# Patient Record
Sex: Male | Born: 1965 | Race: White | Hispanic: No | Marital: Married | Smoking: Current every day smoker
Health system: Southern US, Community
[De-identification: ages and names within clinical notes are randomized; demographics above are authoritative.]

## PROBLEM LIST (undated history)

## (undated) DIAGNOSIS — F17200 Nicotine dependence, unspecified, uncomplicated: Secondary | ICD-10-CM

## (undated) DIAGNOSIS — I10 Essential (primary) hypertension: Secondary | ICD-10-CM

## (undated) DIAGNOSIS — R569 Unspecified convulsions: Secondary | ICD-10-CM

## (undated) DIAGNOSIS — F319 Bipolar disorder, unspecified: Secondary | ICD-10-CM

## (undated) DIAGNOSIS — R0600 Dyspnea, unspecified: Secondary | ICD-10-CM

## (undated) DIAGNOSIS — F329 Major depressive disorder, single episode, unspecified: Secondary | ICD-10-CM

## (undated) DIAGNOSIS — E785 Hyperlipidemia, unspecified: Secondary | ICD-10-CM

## (undated) DIAGNOSIS — M1990 Unspecified osteoarthritis, unspecified site: Secondary | ICD-10-CM

## (undated) DIAGNOSIS — F419 Anxiety disorder, unspecified: Secondary | ICD-10-CM

## (undated) DIAGNOSIS — Z8701 Personal history of pneumonia (recurrent): Secondary | ICD-10-CM

## (undated) DIAGNOSIS — E119 Type 2 diabetes mellitus without complications: Secondary | ICD-10-CM

## (undated) DIAGNOSIS — G4730 Sleep apnea, unspecified: Secondary | ICD-10-CM

## (undated) DIAGNOSIS — J45909 Unspecified asthma, uncomplicated: Secondary | ICD-10-CM

## (undated) DIAGNOSIS — Z87442 Personal history of urinary calculi: Secondary | ICD-10-CM

## (undated) DIAGNOSIS — F32A Depression, unspecified: Secondary | ICD-10-CM

## (undated) DIAGNOSIS — Z9189 Other specified personal risk factors, not elsewhere classified: Secondary | ICD-10-CM

## (undated) HISTORY — DX: Morbid (severe) obesity due to excess calories: E66.01

## (undated) HISTORY — PX: COLONOSCOPY: SHX174

## (undated) HISTORY — DX: Nicotine dependence, unspecified, uncomplicated: F17.200

## (undated) HISTORY — DX: Essential (primary) hypertension: I10

## (undated) HISTORY — DX: Type 2 diabetes mellitus without complications: E11.9

## (undated) HISTORY — DX: Hyperlipidemia, unspecified: E78.5

## (undated) HISTORY — PX: OTHER SURGICAL HISTORY: SHX169

## (undated) HISTORY — DX: Other specified personal risk factors, not elsewhere classified: Z91.89

---

## 2016-05-08 ENCOUNTER — Encounter: Payer: Self-pay | Admitting: "Endocrinology

## 2016-05-08 ENCOUNTER — Ambulatory Visit (INDEPENDENT_AMBULATORY_CARE_PROVIDER_SITE_OTHER): Payer: Medicare HMO | Admitting: "Endocrinology

## 2016-05-08 VITALS — BP 130/88 | HR 77 | Ht 72.0 in | Wt 309.0 lb

## 2016-05-08 DIAGNOSIS — E291 Testicular hypofunction: Secondary | ICD-10-CM

## 2016-05-08 NOTE — Progress Notes (Signed)
HPI: Frank [name] is a 51 y.o.-year-old man, referred by his PCP, Dr. Merleen MillinerWinfield, for evaluation and management of low testosterone.  He admits for decreased libido.Has difficulty obtaining or maintaining an erection.No trauma to testes, Chemotherapy,  testicular irradiation nor surgery. No h/o of mumps orchitis/h/o autoimmune disorders. No h/o cryptorchidism. He grew and went through puberty like his peers.No shrinking of testes.  No incomplete/delayed sexual development.    No breast discomfort/gynecomastia.   No loss of body hair (axillary/pubic)/decreased need for shaving. No abnormal sense of smell (only allergies). No hot flushes. No vision problems. No report of changing headaches. No FH of hypogonadism/infertility . No personal h/o infertility - has children. No FH of hemochromatosis or pituitary tumors. Has had  excessive weight all of his adult life.  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 Has hx of  chronic pain, requiring opiates, does not take steroids.  - He denies excessive alcohol use , has had exposure to steroids due to his COPD.  - Denies history of anabolic steroids/androgens.  - He was started on testosterone gel for 1 year with unsatisfactory results and was switched to testosterone injection a year ago, he does not know the dose.  No herbal medicines. He is on  polypharmacy including multiple antidepressants and anti-epileptics.     Outpatient Encounter Prescriptions as of 05/08/2016  Medication Sig  . atorvastatin (LIPITOR) 40 MG tablet Take 40 mg by mouth daily.  . divalproex (DEPAKOTE) 500 MG DR tablet Take 500 mg by mouth 3 (three) times daily.  Marland Kitchen. esomeprazole (NEXIUM) 40 MG capsule Take 40 mg by mouth daily at 12 noon.  . Fluticasone-Salmeterol (ADVAIR) 500-50 MCG/DOSE AEPB Inhale 1 puff into the lungs 2 (two) times daily.  Marland Kitchen. gabapentin (NEURONTIN) 300 MG capsule Take 300 mg by mouth 3 (three) times daily.  Marland Kitchen. glimepiride (AMARYL) 2 MG tablet Take 2 mg by mouth daily with  breakfast.  . hydrochlorothiazide (HYDRODIURIL) 25 MG tablet Take 25 mg by mouth daily.  Marland Kitchen. lisinopril (PRINIVIL,ZESTRIL) 20 MG tablet Take 20 mg by mouth daily.  . metFORMIN (GLUCOPHAGE) 1000 MG tablet Take 1,000 mg by mouth 2 (two) times daily with a meal.  . montelukast (SINGULAIR) 10 MG tablet Take 10 mg by mouth at bedtime.  Marland Kitchen. PARoxetine (PAXIL) 40 MG tablet Take 40 mg by mouth every morning.  . verapamil (CALAN) 120 MG tablet Take 120 mg by mouth once.   No facility-administered encounter medications on file as of 05/08/2016.     ROS: Constitutional: obese most of his adult life, + fatigue, no subjective hyperthermia, no subjective hypothermia Eyes: no blurry vision, no xerophthalmia ENT: no sore throat, no nodules palpated in throat, no dysphagia/odynophagia, no hoarseness Cardiovascular: no Chest Pain, + Shortness of Breath, no palpitations, no leg swelling Respiratory: + cough, no SOB Gastrointestinal: no Nausea/Vomiting/Diarhhea Musculoskeletal: no muscle/joint aches Skin: no rashes Neurological: no tremors, no numbness, no tingling, no dizziness Psychiatric: no depression, no anxiety   PE: BP 130/88   Pulse 77   Ht 6' (1.829 m)   Wt (!) 309 lb (140.2 kg)   BMI 41.91 kg/m  Wt Readings from Last 3 Encounters:  05/08/16 (!) 309 lb (140.2 kg)   Constitutional: obese, in NAD Eyes: PERRLA, EOMI, no exophthalmos ENT: moist mucous membranes, no thyromegaly, no cervical lymphadenopathy Cardiovascular: Distant heart sounds, No murmur/gallop/rubs Respiratory: + Scattered wheezes and poor air entry Gastrointestinal: obese, abdomen soft, NT, ND, BS+ Musculoskeletal: no deformities, strength intact in all 4 Skin: moist, warm, no  rashes Neurological: no tremor with outstretched hands, DTR normal in all 4 Genital exam: normal male escutcheon, no inguinal LAD, normal phallus, testes ~20-61mL, no testicular masses, no penile discharge.  - Has mild gynecomastia.  Review of his  labs from the referral package shows that his testosterone was as follows 11/23/2014 143, 12/02/2015 133, and 01/09/2016  99 This is despite testosterone injection for the last year-dose unclear. - His labs show normal hematocrit, normal transaminases, normal PSA. - He has well controlled type 2 diabetes with A1c of 6%.  ASSESSMENT: 1. Hypogonadism 2. Erectile Dysfunction   PLAN:  1. Low total testosterone I had a long discussion with the pt regarding his testosterone results, which we reviewed together.   - He has multiple risk factors for hypogonadism including obesity, exposure to opioids, chronic heavy smoking.  - We discussed the importance of improving his sleep, diet (  to cut down fat and meat and increase the use of fruit and vegetables), and exercise to improve his endogenous testosterone secretion.  - In his case, he may continue to need testosterone supplement balancing the dose again asked cardiac risk factors from testosterone supplementation- since he has also multiple risk factors for coronary artery disease including type 2 diabetes, smoking, hypertension, obesity, sedentary life, hyperlipidemia . - We will start with low-dose and advance as appropriate.  - I have discussed use of testosterone,  diagnosis of hypogonadism,  and relevant workup of hypogonadism with him. - I will proceed to obtain early morning blood sample for LH/FSH, prolactin, total testosterone, free testosterone, and SH BG - If there is a significant pathology indicating secondary hypogonadism he will be considered for MRI of the sella/pituitary.  2. Erectile dysfunction - erection is a function of vascular health of the corpora cavernosa, discussed with patient. - May not improve even with testosterone supplement. - The above-mentioned lifestyle measures would improve his sexual health including erectile dysfunction.   Marquis Lunch, MD Phone: [phone]  Fax: [phone]   05/08/2016, 3:22 PM

## 2016-05-10 LAB — TESTOSTERONE, FREE, TOTAL, SHBG
SEX HORMONE BINDING: 19.7 nmol/L (ref 19.3–76.4)
TESTOSTERONE FREE: 2.4 pg/mL — AB (ref 7.2–24.0)
TESTOSTERONE: 85 ng/dL — AB (ref 264–916)

## 2016-05-10 LAB — FSH/LH
FSH: 1.4 m[IU]/mL — ABNORMAL LOW (ref 1.5–12.4)
LH: 0.4 m[IU]/mL — AB (ref 1.7–8.6)

## 2016-05-10 LAB — PROLACTIN: Prolactin: 10.9 ng/mL (ref 4.0–15.2)

## 2016-05-16 ENCOUNTER — Encounter: Payer: Self-pay | Admitting: "Endocrinology

## 2016-05-16 ENCOUNTER — Ambulatory Visit (INDEPENDENT_AMBULATORY_CARE_PROVIDER_SITE_OTHER): Payer: Medicare HMO | Admitting: "Endocrinology

## 2016-05-16 VITALS — BP 139/81 | HR 79 | Ht 72.0 in | Wt 315.0 lb

## 2016-05-16 DIAGNOSIS — E291 Testicular hypofunction: Secondary | ICD-10-CM

## 2016-05-16 MED ORDER — TESTOSTERONE CYPIONATE 200 MG/ML IM SOLN: 100.0000 mg | INTRAMUSCULAR | 0 refills | Status: DC

## 2016-05-16 MED ORDER — "SYRINGE 21G X 1"" 3 ML MISC": 1 refills | Status: DC

## 2016-05-16 NOTE — Progress Notes (Signed)
HPI: Frank [name] is a 51 y.o.-year-old man, referred by his PCP, Dr. Merleen Milliner, for evaluation and management of low testosterone.  He admits for decreased libido. Has difficulty obtaining or maintaining an erection. No trauma to testes, Chemotherapy,  testicular irradiation nor surgery. No h/o of mumps orchitis/h/o autoimmune disorders. No h/o cryptorchidism. He grew and went through puberty like his peers.No shrinking of testes.  No incomplete/delayed sexual development.    No breast discomfort/gynecomastia.   No loss of body hair (axillary/pubic)/decreased need for shaving. No abnormal sense of smell (only allergies). No hot flushes. No vision problems. No report of changing headaches. No FH of hypogonadism/infertility . No personal h/o infertility - has children. No FH of hemochromatosis or pituitary tumors. Has had  excessive weight all of his adult life.  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 Has hx of  chronic pain, requiring opiates, does not take steroids.  - He denies excessive alcohol use , has had exposure to steroids due to his COPD.  - Denies history of anabolic steroids/androgens.  - He was started on testosterone gel for 1 year with unsatisfactory results and was switched to testosterone injection a year ago, he does not know the dose.  No herbal medicines. He is on  polypharmacy including multiple antidepressants and anti-epileptics.     Outpatient Encounter Prescriptions as of 05/16/2016  Medication Sig  . aspirin EC 81 MG tablet Take 81 mg by mouth daily.  Marland Kitchen atorvastatin (LIPITOR) 40 MG tablet Take 40 mg by mouth daily.  . celecoxib (CELEBREX) 200 MG capsule Take 200 mg by mouth 2 (two) times daily.  . divalproex (DEPAKOTE) 500 MG DR tablet Take 500 mg by mouth 3 (three) times daily.  Marland Kitchen esomeprazole (NEXIUM) 40 MG capsule Take 40 mg by mouth daily at 12 noon.  . Fluticasone-Salmeterol (ADVAIR) 500-50 MCG/DOSE AEPB Inhale 1 puff into the lungs 2 (two) times daily.  Marland Kitchen gabapentin  (NEURONTIN) 300 MG capsule Take 300 mg by mouth 3 (three) times daily.  Marland Kitchen glimepiride (AMARYL) 2 MG tablet Take 2 mg by mouth daily with breakfast.  . hydrochlorothiazide (HYDRODIURIL) 25 MG tablet Take 25 mg by mouth daily.  Marland Kitchen lisinopril (PRINIVIL,ZESTRIL) 20 MG tablet Take 20 mg by mouth daily.  . metFORMIN (GLUCOPHAGE) 1000 MG tablet Take 1,000 mg by mouth 2 (two) times daily with a meal.  . montelukast (SINGULAIR) 10 MG tablet Take 10 mg by mouth at bedtime.  Marland Kitchen PARoxetine (PAXIL) 40 MG tablet Take 40 mg by mouth every morning.  . Syringe/Needle, Disp, (SYRINGE 3CC/21GX1") 21G X 1" 3 ML MISC Used to inject testosterone every 14 days  . testosterone cypionate (DEPOTESTOSTERONE CYPIONATE) 200 MG/ML injection Inject 0.5 mLs (100 mg total) into the muscle every 14 (fourteen) days.  . verapamil (CALAN) 120 MG tablet Take 120 mg by mouth once.   No facility-administered encounter medications on file as of 05/16/2016.     ROS: Constitutional: obese most of his adult life, + fatigue, no subjective hyperthermia, no subjective hypothermia Eyes: no blurry vision, no xerophthalmia ENT: no sore throat, no nodules palpated in throat, no dysphagia/odynophagia, no hoarseness Cardiovascular: no Chest Pain, + Shortness of Breath, no palpitations, no leg swelling Respiratory: + cough, no SOB Gastrointestinal: no Nausea/Vomiting/Diarhhea Musculoskeletal: no muscle/joint aches Skin: no rashes Neurological: no tremors, no numbness, no tingling, no dizziness Psychiatric: no depression, no anxiety   PE: BP 139/81   Pulse 79   Ht 6' (1.829 m)   Wt (!) 315 lb (142.9 kg)  BMI 42.72 kg/m  Wt Readings from Last 3 Encounters:  05/16/16 (!) 315 lb (142.9 kg)  05/08/16 (!) 309 lb (140.2 kg)   Constitutional: obese, in NAD Eyes: PERRLA, EOMI, no exophthalmos ENT: moist mucous membranes, no thyromegaly, no cervical lymphadenopathy Cardiovascular: Distant heart sounds, No murmur/gallop/rubs Respiratory: +  Scattered wheezes and poor air entry Gastrointestinal: obese, abdomen soft, NT, ND, BS+ Musculoskeletal: no deformities, strength intact in all 4 Skin: moist, warm, no rashes Neurological: no tremor with outstretched hands, DTR normal in all 4 Genital exam: normal male escutcheon, no inguinal LAD, normal phallus, testes ~20-5125mL, no testicular masses, no penile discharge.  - Has mild gynecomastia.  Review of his labs from the referral package shows that his testosterone was as follows 11/23/2014 143, 12/02/2015 133, and 01/09/2016  99 This is despite testosterone injection for the last year-dose unclear. - His labs show normal hematocrit, normal transaminases, normal PSA. - He has well controlled type 2 diabetes with A1c of 6%.   Recent Results (from the past 2160 hour(s))  Testosterone, Free, Total, SHBG     Status: Abnormal   Collection Time: 05/09/16  8:50 AM  Result Value Ref Range   Testosterone 85 (L) 264 - 916 ng/dL    Comment: Adult male reference interval is based on a population of healthy nonobese males (BMI <30) between 6319 and 52 years old. Travison, et.al. JCEM [id number],102;1161-1173. PMID: [id number].    Testosterone, Free 2.4 (L) 7.2 - 24.0 pg/mL   Sex Hormone Binding 19.7 19.3 - 76.4 nmol/L  FSH/LH     Status: Abnormal   Collection Time: 05/09/16  8:50 AM  Result Value Ref Range   LH 0.4 (L) 1.7 - 8.6 mIU/mL   FSH 1.4 (L) 1.5 - 12.4 mIU/mL  Prolactin     Status: None   Collection Time: 05/09/16  8:50 AM  Result Value Ref Range   Prolactin 10.9 4.0 - 15.2 ng/mL    ASSESSMENT: 1. Hypogonadotropic Hypogonadism 2. Erectile Dysfunction   PLAN:  1. Low total testosterone I had a long discussion with the pt regarding his testosterone results, which we reviewed together. 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 Prolactin is normal, we will defer pituitary/sella imaging for now. - He has multiple risk factors for hypogonadism including  obesity, exposure to opioids, chronic heavy smoking.  - We discussed the importance of improving his sleep, diet (  to cut down fat and meat and increase the use of fruit and vegetables), and exercise to improve his endogenous testosterone secretion.  - In his case, total testosterone is only 85 and free testosterone is also low at 2.4. He will continue to need testosterone supplement balancing the dose against cardiac risk factors from testosterone supplementation- since he has also multiple risk factors for coronary artery disease including type 2 diabetes, smoking, hypertension, obesity, sedentary life, hyperlipidemia . - We will start with low-dose and advance as appropriate. I prescribed testosterone 100 mg IM every 14 days. He will continue to get his injections at his PMD office. He will return in 3 months with repeat testosterone, CBC, PSA.  2. Erectile dysfunction - erection is a function of vascular health of the corpora cavernosa, discussed with patient. - May not improve even with testosterone supplement.  - The above-mentioned lifestyle measures would improve his sexual health including erectile dysfunction.  Frank LunchGebre Kierria Feigenbaum, MD Phone: [phone]  Fax: [phone]   05/16/2016, 9:40 AM

## 2016-08-16 ENCOUNTER — Ambulatory Visit: Payer: Medicare HMO | Admitting: "Endocrinology

## 2016-09-17 ENCOUNTER — Other Ambulatory Visit: Payer: Self-pay | Admitting: "Endocrinology

## 2016-11-28 ENCOUNTER — Other Ambulatory Visit: Payer: Self-pay

## 2016-11-28 MED ORDER — "SYRINGE/NEEDLE (DISP) 25G X 1"" 1 ML MISC": 0 refills | Status: DC

## 2017-10-15 ENCOUNTER — Other Ambulatory Visit (INDEPENDENT_AMBULATORY_CARE_PROVIDER_SITE_OTHER): Payer: Self-pay | Admitting: Radiology

## 2017-10-15 ENCOUNTER — Other Ambulatory Visit: Payer: Self-pay | Admitting: Radiology

## 2017-10-15 ENCOUNTER — Ambulatory Visit (INDEPENDENT_AMBULATORY_CARE_PROVIDER_SITE_OTHER): Payer: Medicare HMO | Admitting: Orthopedic Surgery

## 2017-10-15 ENCOUNTER — Encounter (INDEPENDENT_AMBULATORY_CARE_PROVIDER_SITE_OTHER): Payer: Self-pay | Admitting: Orthopedic Surgery

## 2017-10-15 VITALS — BP 115/66 | Ht 69.0 in | Wt 302.0 lb

## 2017-10-15 DIAGNOSIS — M1612 Unilateral primary osteoarthritis, left hip: Secondary | ICD-10-CM | POA: Diagnosis not present

## 2017-10-15 DIAGNOSIS — M1712 Unilateral primary osteoarthritis, left knee: Secondary | ICD-10-CM

## 2017-10-15 MED ORDER — METHYLPREDNISOLONE ACETATE 40 MG/ML IJ SUSP
80.0000 mg | INTRAMUSCULAR | Status: AC | PRN
  Administered 2017-10-15: 80 mg

## 2017-10-15 MED ORDER — BUPIVACAINE HCL 0.5 % IJ SOLN
2.0000 mL | INTRAMUSCULAR | Status: AC | PRN
  Administered 2017-10-15: 2 mL via INTRA_ARTICULAR

## 2017-10-15 MED ORDER — LIDOCAINE HCL 1 % IJ SOLN
2.0000 mL | INTRAMUSCULAR | Status: AC | PRN
  Administered 2017-10-15: 2 mL

## 2017-10-15 NOTE — Progress Notes (Signed)
Office Visit Note   Patient: Frank [name]           Date of Birth: 03/11/1966           MRN: [id number] Visit Date: 10/15/2017              Requested by: Arlina RobesWinfield, Albert Carl, MD [address] [place], [place] [id number] PCP: Arlina RobesWinfield, Albert Carl, MD   Assessment & Plan: Visit Diagnoses:  1. Unilateral primary osteoarthritis, left knee   2. Unilateral primary osteoarthritis, left hip      Plan: #1: Corticosteroid injection to the left knee. #2: Discussed weight loss, water aerobics, dietitian for consultation #3: Follow back up in 1 month for recheck evaluation. #4: He will check his sugars several times today and call his medical doctor if elevated.    Follow-Up Instructions: Return in about 1 month (around 11/12/2017).   Face-to-face time spent with patient was greater than 50 minutes.  Greater than 50% of the time was spent in counseling and coordination of care.  Orders:  Orders Placed This Encounter  Procedures  . Large Joint Inj: L knee   No orders of the defined types were placed in this encounter.     Procedures: Large Joint Inj: L knee on 10/15/2017 11:17 AM Indications: pain and diagnostic evaluation Details: 25 G 1.5 in needle, anteromedial approach  Arthrogram: No  Medications: 2 mL bupivacaine 0.5 %; 2 mL lidocaine 1 %; 80 mg methylPREDNISolone acetate 40 MG/ML Procedure, treatment alternatives, risks and benefits explained, specific risks discussed. Consent was given by the patient. Patient was prepped and draped in the usual sterile fashion.       Clinical Data: No additional findings.   Subjective: Chief Complaint  Patient presents with  . New Patient (Initial Visit)    L HIP AND L KNEE PAIN WENT TO ERIN FEB 2019 AND APRIL 2019 HARD TO BEAR WEIGHT AND IT GIVES AWAY AT TIMES, HAS POPPING A FEELS LIKE SITTING IN TENNIS BALL. HAD INJECTION BUT DID NOT HELP    HPI  Frank [name] is a very pleasant male seen for evaluation of his left hip and left knee.   [facility] emergency room in February and April.  Had problems with his hip and x-rays have been performed revealing bone-on-bone arthritis with cystic changes in the femoral head.  He also has his referred with osteoarthritis of the left knee.  In regards to his hip and knee he has had IM.  He has had difficulty with sleeping and significant difficulty with weightbearing.  He has been seen by other orthopedist who have refused to do his surgery of a total hip replacement because of his BMI greater than 40.  He continues to have difficulty.  He does use ibuprofen 800 mg twice daily as well as IcyHot ointment.  Comes in today for evaluation.   Review of Systems  Constitutional: Positive for fatigue. Negative for fever.  HENT: Negative for ear pain.   Eyes: Negative for pain.  Respiratory: Positive for shortness of breath. Negative for cough.   Cardiovascular: Positive for leg swelling.  Gastrointestinal: Negative for constipation and diarrhea.  Genitourinary: Negative for difficulty urinating.  Musculoskeletal: Positive for back pain. Negative for neck pain.  Skin: Negative for rash.  Allergic/Immunologic: Positive for food allergies.  Neurological: Positive for weakness and numbness.  Hematological: Bruises/bleeds easily.  Psychiatric/Behavioral: Positive for sleep disturbance.     Objective: Vital Signs: BP 115/66 (BP Location: Left Arm, Patient Position: Sitting, Cuff Size: Normal)  Ht 5\' 9"  (1.753 m)   Wt (!) 302 lb (137 kg)   BMI 44.60 kg/m   Physical Exam  Constitutional: He is oriented to person, place, and time. He appears well-developed and well-nourished.  HENT:  Mouth/Throat: Oropharynx is clear and moist.  Eyes: Pupils are equal, round, and reactive to light. EOM are normal.  Pulmonary/Chest: Effort normal.  Neurological: He is alert and oriented to person, place, and time.  Skin: Skin is warm and dry.  Psychiatric: He has a normal mood and affect. His behavior is normal.      Ortho Exam  Exam today reveals very little internal and external rotation of the.  He can sit to about 70 to 80 degrees of flexion of the hip.  He resists me from further flexion.  The left knee reveals range of motion lacks a few degrees of full extension and is able to flex to about 95 degrees.  Does have crepitance with range of motion of the knee.  Trace effusion.  Tender more lateral than medial.  This is more posterior and lateral.    Specialty Comments:  No specialty comments available.  Imaging: No results found.  X-rays reviewed from the PACS system does show bone-on-bone.  Cystic formations in the femoral head.  Questionable avascular necrosis.  X-ray of the left knee reveals OA of the medial compartment with subchondral cysts. Periarticular spur medial distal femur and  Proximal lateral tibial plateau. Sclerosing medial compartments. Intercondylar spurs.  PF OA changes.   PMFS History: Current Outpatient Medications  Medication Sig Dispense Refill  . aspirin EC 81 MG tablet Take 81 mg by mouth daily.    Marland Kitchen atorvastatin (LIPITOR) 40 MG tablet Take 40 mg by mouth daily.    . celecoxib (CELEBREX) 200 MG capsule Take 200 mg by mouth 2 (two) times daily.    . divalproex (DEPAKOTE) 500 MG DR tablet Take 500 mg by mouth 3 (three) times daily.    Marland Kitchen esomeprazole (NEXIUM) 40 MG capsule Take 40 mg by mouth daily at 12 noon.    . Fluticasone-Salmeterol (ADVAIR) 500-50 MCG/DOSE AEPB Inhale 1 puff into the lungs 2 (two) times daily.    Marland Kitchen gabapentin (NEURONTIN) 300 MG capsule Take 300 mg by mouth 3 (three) times daily.    Marland Kitchen glimepiride (AMARYL) 2 MG tablet Take 2 mg by mouth daily with breakfast.    . hydrochlorothiazide (HYDRODIURIL) 25 MG tablet Take 25 mg by mouth daily.    Marland Kitchen lisinopril (PRINIVIL,ZESTRIL) 20 MG tablet Take 20 mg by mouth daily.    . metFORMIN (GLUCOPHAGE) 1000 MG tablet Take 1,000 mg by mouth 2 (two) times daily with a meal.    . methocarbamol (ROBAXIN) 500 MG  tablet Take 500 mg by mouth 4 (four) times daily.    . montelukast (SINGULAIR) 10 MG tablet Take 10 mg by mouth at bedtime.    Marland Kitchen PARoxetine (PAXIL) 40 MG tablet Take 40 mg by mouth every morning.    . Syringe/Needle, Disp, (SYRINGE 3CC/21GX1") 21G X 1" 3 ML MISC Used to inject testosterone every 14 days 50 each 1  . Syringe/Needle, Disp, 25G X 1" 1 ML MISC Use as directed 25 each 0  . verapamil (CALAN) 120 MG tablet Take 120 mg by mouth once.    . testosterone cypionate (DEPOTESTOSTERONE CYPIONATE) 200 MG/ML injection Inject 0.5 mLs (100 mg total) into the muscle every 14 (fourteen) days. (Patient not taking: Reported on 10/15/2017) 10 mL 0   No current facility-administered  medications for this visit.     Patient Active Problem List   Diagnosis Date Noted  . Hypogonadism, male 05/08/2016   Past Medical History:  Diagnosis Date  . Current every day smoker   . Diabetes mellitus, type II (HCC)   . Hyperlipidemia   . Hypertension   . Morbid obesity (HCC)   . Sedentary lifestyle     Family History  Problem Relation Age of Onset  . Diabetes Mother   . Hypertension Mother   . Heart attack Mother   . Osteoporosis Mother   . Diabetes Father   . Hypertension Father   . Heart attack Father   . Diabetes Sister   . Hypertension Sister   . Diabetes Brother   . Hypertension Brother     Past Surgical History:  Procedure Laterality Date  . OTHER SURGICAL HISTORY     R & L shoulder  . OTHER SURGICAL HISTORY     Knee surgery   Social History   Occupational History  . Not on file  Tobacco Use  . Smoking status: Current Every Day Smoker  . Smokeless tobacco: Never Used  Substance and Sexual Activity  . Alcohol use: No  . Drug use: No  . Sexual activity: Not on file

## 2017-10-16 ENCOUNTER — Other Ambulatory Visit: Payer: Self-pay | Admitting: Radiology

## 2017-10-16 DIAGNOSIS — E11610 Type 2 diabetes mellitus with diabetic neuropathic arthropathy: Secondary | ICD-10-CM

## 2017-11-12 ENCOUNTER — Ambulatory Visit (INDEPENDENT_AMBULATORY_CARE_PROVIDER_SITE_OTHER): Payer: Medicare HMO | Admitting: Orthopaedic Surgery

## 2017-11-12 ENCOUNTER — Encounter (INDEPENDENT_AMBULATORY_CARE_PROVIDER_SITE_OTHER): Payer: Self-pay | Admitting: Orthopaedic Surgery

## 2017-11-12 VITALS — BP 91/71 | HR 90 | Ht 72.0 in | Wt 300.0 lb

## 2017-11-12 DIAGNOSIS — M1712 Unilateral primary osteoarthritis, left knee: Secondary | ICD-10-CM

## 2017-11-12 DIAGNOSIS — M1612 Unilateral primary osteoarthritis, left hip: Secondary | ICD-10-CM | POA: Diagnosis not present

## 2017-11-12 NOTE — Progress Notes (Signed)
Office Visit Note   Patient: Frank Gunnerddie Lee Prom Sr.           Date of Birth: Dec 15, 1965           MRN: [id number] Visit Date: 11/12/2017              Requested by: Arlina RobesWinfield, Albert Carl, MD [address] [place], [place] [id number] PCP: Arlina RobesWinfield, Albert Carl, MD   Assessment & Plan: Visit Diagnoses:  1. Unilateral primary osteoarthritis, left knee   2. Unilateral primary osteoarthritis, left hip     Plan: I think the problem with [name]'s left lower extremity is primarily the osteoarthritis of his left hip.  We will consult Dr. Magnus IvanBlackman regarding hip replacement surgery.  [name] is aware of his weight and a BMI of 41 which may preclude his surgery.  The majority of his weight seems to be above his waist.  Follow-Up Instructions: Return will consult Dr Magnus IvanBlackman re OA left hip.   Orders:  No orders of the defined types were placed in this encounter.  No orders of the defined types were placed in this encounter.     Procedures: No procedures performed   Clinical Data: No additional findings.   Subjective: Chief Complaint  Patient presents with  . Follow-up    L KNEE F/U 10/15/17 L KNEE INJECTION DIDNT HELP  [name] was seen a month ago by Arlys JohnBrian for evaluation of pain in his left lower extremity.  Films demonstrated significant arthritis of his left hip and moderate amount of arthritis in his left knee.  His left knee was injected with cortisone and he had "some relief".  The majority of his pain seems to be in the area of his left hip.  He said difficulty ambulating.  He is diabetic and has had some difficulty controlling his sugars.  He also is overweight with a BMI of 41.  Does use a walker at home  HPI  Review of Systems  Constitutional: Negative for fatigue and fever.  HENT: Negative for ear pain.   Eyes: Negative for pain.  Respiratory: Negative for cough and shortness of breath.   Cardiovascular: Negative for leg swelling.  Gastrointestinal: Negative for  constipation and diarrhea.  Genitourinary: Negative for difficulty urinating.  Musculoskeletal: Negative for back pain and neck pain.  Skin: Negative for rash.  Allergic/Immunologic: Positive for food allergies.  Neurological: Positive for weakness. Negative for numbness.  Hematological: Does not bruise/bleed easily.  Psychiatric/Behavioral: Positive for sleep disturbance.     Objective: Vital Signs: BP 91/71 (BP Location: Left Arm, Patient Position: Sitting, Cuff Size: Normal)   Pulse 90   Ht 6' (1.829 m)   Wt 300 lb (136.1 kg)   BMI 40.69 kg/m   Physical Exam  Constitutional: He is oriented to person, place, and time. He appears well-developed and well-nourished.  HENT:  Mouth/Throat: Oropharynx is clear and moist.  Eyes: Pupils are equal, round, and reactive to light. EOM are normal.  Pulmonary/Chest: Effort normal.  Neurological: He is alert and oriented to person, place, and time.  Skin: Skin is warm and dry.  Psychiatric: He has a normal mood and affect. His behavior is normal.    Ortho Exam [name] evaluated with his wife in the room while he sat in a wheelchair.  He did not have any significant pain in reference to his left knee.  No effusion.  Some medial joint pain consistent with his arthritis.  No popliteal discomfort.  Some burning in  his left foot consistent with possible diabetic neuropathy.  Considerable pain with range of motion of his left hip.  He maintains his left lower extremity is slightly externally rotated position.  There was minimal motion from that position of only about 5 or 10 degrees with internal/external rotation.  No pain about the lateral aspect of his hip.  Straight leg raise is negative for any back pain.  Specialty Comments:  No specialty comments available.  Imaging: No results found.   PMFS History: Patient Active Problem List   Diagnosis Date Noted  . Unilateral primary osteoarthritis, left knee 11/12/2017  . Unilateral primary  osteoarthritis, left hip 11/12/2017  . Hypogonadism, male 05/08/2016   Past Medical History:  Diagnosis Date  . Current every day smoker   . Diabetes mellitus, type II (HCC)   . Hyperlipidemia   . Hypertension   . Morbid obesity (HCC)   . Sedentary lifestyle     Family History  Problem Relation Age of Onset  . Diabetes Mother   . Hypertension Mother   . Heart attack Mother   . Osteoporosis Mother   . Diabetes Father   . Hypertension Father   . Heart attack Father   . Diabetes Sister   . Hypertension Sister   . Diabetes Brother   . Hypertension Brother     Past Surgical History:  Procedure Laterality Date  . OTHER SURGICAL HISTORY     R & L shoulder  . OTHER SURGICAL HISTORY     Knee surgery   Social History   Occupational History  . Not on file  Tobacco Use  . Smoking status: Current Every Day Smoker  . Smokeless tobacco: Never Used  Substance and Sexual Activity  . Alcohol use: No  . Drug use: No  . Sexual activity: Not on file

## 2017-11-27 ENCOUNTER — Encounter (INDEPENDENT_AMBULATORY_CARE_PROVIDER_SITE_OTHER): Payer: Self-pay | Admitting: Orthopaedic Surgery

## 2017-11-27 ENCOUNTER — Ambulatory Visit (INDEPENDENT_AMBULATORY_CARE_PROVIDER_SITE_OTHER): Payer: Medicare HMO | Admitting: Orthopaedic Surgery

## 2017-11-27 DIAGNOSIS — M1712 Unilateral primary osteoarthritis, left knee: Secondary | ICD-10-CM | POA: Diagnosis not present

## 2017-11-27 NOTE — Progress Notes (Signed)
Office Visit Note   Patient: Frank Gunnerddie Lee Knowlton Sr.           Date of Birth: 01-18-1966           MRN: [id number] Visit Date: 11/27/2017              Requested by: Arlina RobesWinfield, Albert Carl, MD [address] [place], [place] [id number] PCP: Arlina RobesWinfield, Albert Carl, MD   Assessment & Plan: Visit Diagnoses:  1. Unilateral primary osteoarthritis, left knee   2. Morbid (severe) obesity due to excess calories (HCC)     Plan: Based on his clinical exam and his x-ray findings I do feel that this warrants a total hip arthroplasty.  I am comfortable performing the surgery with his BMI of 40 given the fact that I can mobilize the soft tissues when he is on exam table and I have a direct line getting to his hip itself.  He absolutely understands the importance of blood glucose control.  I gave him a hip model and went over his x-rays and explained in detail what the surgery involves having him have a handout as well.  We talked about his intraoperative and postoperative course.  We discussed the risk and benefits of the surgery as well.  He is interested in having this done so I do agree with setting this up in the near future.  All question concerns were answered and addressed.  Follow-Up Instructions: Return for 2 weeks post-op.   Orders:  No orders of the defined types were placed in this encounter.  No orders of the defined types were placed in this encounter.     Procedures: No procedures performed   Clinical Data: No additional findings.   Subjective: Chief Complaint  Patient presents with  . Left Hip - Pain  Patient is a very pleasant 52 year old gentleman with diabetes and morbid obesity but also debilitating left hip pain from known osteoarthritis and degenerative joint disease of his left hip.  This is been well documented.  He is seeing Dr. Cleophas [place]field [place] sent to us to consider a left total hip arthroplasty.  He is mainly getting around with a walker due to debilitating left  hip pain.  This is been worsening for over a year now.  He says his hip gives out on him.  His pain is 10 out of 10 at this point it is detrimentally affected his activity living, his quality of life, his mobility.  He states his blood glucose is under good control and his hemoglobin A1c is good.  He is work on weight loss including activity modification.  He is taking anti-inflammatories which is detrimentally affect his blood pressure.  He is ambulate with assistive device which is in a walker.  He is work on hip strengthening exercises as well.  All of his treatment has been done for over a year now.  His hip pain is now rapidly worsening to the point he can get sleep.  HPI  Review of Systems He currently denies any headache, chest pain, shortness of breath, fever, chills, nausea, vomiting.  Objective: Vital Signs: There were no vitals taken for this visit.  Physical Exam He is alert and oriented x3 and in no acute distress Ortho Exam On exam was able to get him up onto the exam table.  His right hip moves normally the left hip has severe pain with any attempts of internal or external rotation and significant stiffness with this as well.  He does feel slightly shorter on the left painful side than the right. Specialty Comments:  No specialty comments available.  Imaging: No results found. X-rays independently reviewed the pelvis and both hips show severe end-stage arthritis of the left hip.  He actually has cystic changes in the femoral head and acetabulum.  There is complete loss of superior lateral joint space.  There are periarticular osteophytes as well as sclerotic changes in the femoral head and acetabulum as well.  PMFS History: Patient Active Problem List   Diagnosis Date Noted  . Morbid (severe) obesity due to excess calories (HCC) 11/27/2017  . Unilateral primary osteoarthritis, left knee 11/12/2017  . Unilateral primary osteoarthritis, left hip 11/12/2017  . Hypogonadism,  male 05/08/2016   Past Medical History:  Diagnosis Date  . Current every day smoker   . Diabetes mellitus, type II (HCC)   . Hyperlipidemia   . Hypertension   . Morbid obesity (HCC)   . Sedentary lifestyle     Family History  Problem Relation Age of Onset  . Diabetes Mother   . Hypertension Mother   . Heart attack Mother   . Osteoporosis Mother   . Diabetes Father   . Hypertension Father   . Heart attack Father   . Diabetes Sister   . Hypertension Sister   . Diabetes Brother   . Hypertension Brother     Past Surgical History:  Procedure Laterality Date  . OTHER SURGICAL HISTORY     R & L shoulder  . OTHER SURGICAL HISTORY     Knee surgery   Social History   Occupational History  . Not on file  Tobacco Use  . Smoking status: Current Every Day Smoker  . Smokeless tobacco: Never Used  Substance and Sexual Activity  . Alcohol use: No  . Drug use: No  . Sexual activity: Not on file

## 2017-12-08 ENCOUNTER — Ambulatory Visit: Payer: Self-pay | Admitting: Nutrition

## 2017-12-16 ENCOUNTER — Other Ambulatory Visit (INDEPENDENT_AMBULATORY_CARE_PROVIDER_SITE_OTHER): Payer: Self-pay

## 2017-12-16 ENCOUNTER — Other Ambulatory Visit (INDEPENDENT_AMBULATORY_CARE_PROVIDER_SITE_OTHER): Payer: Self-pay | Admitting: Physician Assistant

## 2017-12-18 NOTE — Patient Instructions (Addendum)
Frank Dauzat Sr.  12/18/2017   Your procedure is scheduled on: 12-26-17     Report to [facility] Main  Entrance    Report to Admitting at 11:15 AM    Call this number if you have problems the morning of surgery [phone]     Remember: Do not eat food or drink liquids :After Midnight. You may have a Clear Liquid Diet from Midnight until 7:45 AM. After 7:45 AM, nothing until after surgery.      CLEAR LIQUID DIET   Foods Allowed                                                                     Foods Excluded  Coffee and tea, regular and decaf                             liquids that you cannot  Plain Jell-O in any flavor                                             see through such as: Fruit ices (not with fruit pulp)                                     milk, soups, orange juice  Iced Popsicles                                    All solid food Carbonated beverages, regular and diet                                    Cranberry, grape and apple juices Sports drinks like Gatorade Lightly seasoned clear broth or consume(fat free) Sugar, honey syrup  Sample Menu Breakfast                                Lunch                                     Supper Cranberry juice                    Beef broth                            Chicken broth Jell-O                                     Grape juice  Apple juice Coffee or tea                        Jell-O                                      Popsicle                                                Coffee or tea                        Coffee or tea  _____________________________________________________________________      Take these medicines the morning of surgery with A SIP OF WATER: Buspirone (Buspar), Divalproex (Depakote), Esomeprazole (Nexium), Gabapentin (Neurontin), Paroxetine (Paxil), and Verapamil (Calan)  BRUSH YOUR TEETH MORNING OF SURGERY AND RINSE YOUR MOUTH OUT, NO  CHEWING GUM CANDY OR MINTS.   DO NOT TAKE ANY DIABETIC MEDICATIONS DAY OF YOUR SURGERY                               You may not have any metal on your body including hair pins and              piercings  Do not wear jewelry, lotions, powders, cologne or deodorant             Men may shave face and neck.   Do not bring valuables to the hospital. [facility] IS NOT             RESPONSIBLE   FOR VALUABLES.  Contacts, dentures or bridgework may not be worn into surgery.  Leave suitcase in the car. After surgery it may be brought to your room.   Special Instructions: N/A              Please read over the following fact sheets you were given: _____________________________________________________________________             How to Manage Your Diabetes Before and After Surgery  Why is it important to control my blood sugar before and after surgery? . Improving blood sugar levels before and after surgery helps healing and can limit problems. . A way of improving blood sugar control is eating a healthy diet by: o  Eating less sugar and carbohydrates o  Increasing activity/exercise o  Talking with your doctor about reaching your blood sugar goals . High blood sugars (greater than 180 mg/dL) can raise your risk of infections and slow your recovery, so you will need to focus on controlling your diabetes during the weeks before surgery. . Make sure that the doctor who takes care of your diabetes knows about your planned surgery including the date and location.  How do I manage my blood sugar before surgery? . Check your blood sugar at least 4 times a day, starting 2 days before surgery, to make sure that the level is not too high or low. o Check your blood sugar the morning of your surgery when you wake up and every 2 hours until you get to the Short Stay unit. . If your blood sugar is less than 70 mg/dL, you will need to treat for low blood  sugar: o Do not take insulin. o Treat a low blood  sugar (less than 70 mg/dL) with  cup of clear juice (cranberry or apple), 4 glucose tablets, OR glucose gel. o Recheck blood sugar in 15 minutes after treatment (to make sure it is greater than 70 mg/dL). If your blood sugar is not greater than 70 mg/dL on recheck, call [phone] for further instructions. . Report your blood sugar to the short stay nurse when you get to Short Stay.  . If you are admitted to the hospital after surgery: o Your blood sugar will be checked by the staff and you will probably be given insulin after surgery (instead of oral diabetes medicines) to make sure you have good blood sugar levels. o The goal for blood sugar control after surgery is 80-180 mg/dL.   WHAT DO I DO ABOUT MY DIABETES MEDICATION?  Marland Kitchen Do not take oral diabetes medicines (pills) the morning of surgery.  THE DAY BEFORE SURGERY, take only your morning dose of Glimepiride (Amaryl). You may also take your usual dose of Metformin          Reviewed and Endorsed by [facility] Patient Education Committee, August 2015   [facility] - Preparing for Surgery Before surgery, you can play an important role.  Because skin is not sterile, your skin needs to be as free of germs as possible.  You can reduce the number of germs on your skin by washing with CHG (chlorahexidine gluconate) soap before surgery.  CHG is an antiseptic cleaner which kills germs and bonds with the skin to continue killing germs even after washing. Please DO NOT use if you have an allergy to CHG or antibacterial soaps.  If your skin becomes reddened/irritated stop using the CHG and inform your nurse when you arrive at Short Stay. Do not shave (including legs and underarms) for at least 48 hours prior to the first CHG shower.  You may shave your face/neck. Please follow these instructions carefully:  1.  Shower with CHG Soap the night before surgery and the  morning of Surgery.  2.  If you choose to wash your hair, wash your hair first as  usual with your  normal  shampoo.  3.  After you shampoo, rinse your hair and body thoroughly to remove the  shampoo.                           4.  Use CHG as you would any other liquid soap.  You can apply chg directly  to the skin and wash                       Gently with a scrungie or clean washcloth.  5.  Apply the CHG Soap to your body ONLY FROM THE NECK DOWN.   Do not use on face/ open                           Wound or open sores. Avoid contact with eyes, ears mouth and genitals (private parts).                       Wash face,  Genitals (private parts) with your normal soap.             6.  Wash thoroughly, paying special attention to the area where your surgery  will be performed.  7.  Thoroughly rinse your body with warm water from the neck down.  8.  DO NOT shower/wash with your normal soap after using and rinsing off  the CHG Soap.                9.  Pat yourself dry with a clean towel.            10.  Wear clean pajamas.            11.  Place clean sheets on your bed the night of your first shower and do not  sleep with pets. Day of Surgery : Do not apply any lotions/deodorants the morning of surgery.  Please wear clean clothes to the hospital/surgery center.  FAILURE TO FOLLOW THESE INSTRUCTIONS MAY RESULT IN THE CANCELLATION OF YOUR SURGERY PATIENT SIGNATURE_________________________________  NURSE SIGNATURE__________________________________  ________________________________________________________________________   Rogelia Mire  An incentive spirometer is a tool that can help keep your lungs clear and active. This tool measures how well you are filling your lungs with each breath. Taking long deep breaths may help reverse or decrease the chance of developing breathing (pulmonary) problems (especially infection) following:  A long period of time when you are unable to move or be active. BEFORE THE PROCEDURE   If the spirometer includes an indicator to show your  best effort, your nurse or respiratory therapist will set it to a desired goal.  If possible, sit up straight or lean slightly forward. Try not to slouch.  Hold the incentive spirometer in an upright position. INSTRUCTIONS FOR USE  1. Sit on the edge of your bed if possible, or sit up as far as you can in bed or on a chair. 2. Hold the incentive spirometer in an upright position. 3. Breathe out normally. 4. Place the mouthpiece in your mouth and seal your lips tightly around it. 5. Breathe in slowly and as deeply as possible, raising the piston or the ball toward the top of the column. 6. Hold your breath for 3-5 seconds or for as long as possible. Allow the piston or ball to fall to the bottom of the column. 7. Remove the mouthpiece from your mouth and breathe out normally. 8. Rest for a few seconds and repeat Steps 1 through 7 at least 10 times every 1-2 hours when you are awake. Take your time and take a few normal breaths between deep breaths. 9. The spirometer may include an indicator to show your best effort. Use the indicator as a goal to work toward during each repetition. 10. After each set of 10 deep breaths, practice coughing to be sure your lungs are clear. If you have an incision (the cut made at the time of surgery), support your incision when coughing by placing a pillow or rolled up towels firmly against it. Once you are able to get out of bed, walk around indoors and cough well. You may stop using the incentive spirometer when instructed by your caregiver.  RISKS AND COMPLICATIONS  Take your time so you do not get dizzy or light-headed.  If you are in pain, you may need to take or ask for pain medication before doing incentive spirometry. It is harder to take a deep breath if you are having pain. AFTER USE  Rest and breathe slowly and easily.  It can be helpful to keep track of a log of your progress. Your caregiver can provide you with a simple table to help with this. If  you are  using the spirometer at home, follow these instructions: [place] IF:   You are having difficultly using the spirometer.  You have trouble using the spirometer as often as instructed.  Your pain medication is not giving enough relief while using the spirometer.  You develop fever of 100.5 F (38.1 C) or higher. SEEK IMMEDIATE MEDICAL CARE IF:   You cough up bloody sputum that had not been present before.  You develop fever of 102 F (38.9 C) or greater.  You develop worsening pain at or near the incision site. MAKE SURE YOU:   Understand these instructions.  Will watch your condition.  Will get help right away if you are not doing well or get worse. Document Released: 07/15/2006 Document Revised: 05/27/2011 Document Reviewed: 09/15/2006 [facility] Patient Information 2014 [place], [place].   ________________________________________________________________________

## 2017-12-19 ENCOUNTER — Encounter (HOSPITAL_COMMUNITY): Payer: Self-pay

## 2017-12-19 ENCOUNTER — Other Ambulatory Visit: Payer: Self-pay

## 2017-12-19 ENCOUNTER — Encounter (HOSPITAL_COMMUNITY)
Admission: RE | Admit: 2017-12-19 | Discharge: 2017-12-19 | Disposition: A | Discharge status: Home / Self Care | Payer: Medicare HMO | Source: Ambulatory Visit | Attending: Orthopaedic Surgery | Admitting: Orthopaedic Surgery

## 2017-12-19 DIAGNOSIS — Z01812 Encounter for preprocedural laboratory examination: Secondary | ICD-10-CM | POA: Diagnosis present

## 2017-12-19 DIAGNOSIS — E119 Type 2 diabetes mellitus without complications: Secondary | ICD-10-CM | POA: Diagnosis not present

## 2017-12-19 HISTORY — DX: Unspecified asthma, uncomplicated: J45.909

## 2017-12-19 HISTORY — DX: Major depressive disorder, single episode, unspecified: F32.9

## 2017-12-19 HISTORY — DX: Depression, unspecified: F32.A

## 2017-12-19 LAB — SURGICAL PCR SCREEN
MRSA, PCR: NEGATIVE
Staphylococcus aureus: NEGATIVE

## 2017-12-19 LAB — CBC
HCT: 39.5 % (ref 39.0–52.0)
Hemoglobin: 12.5 g/dL — ABNORMAL LOW (ref 13.0–17.0)
MCH: 28.7 pg (ref 26.0–34.0)
MCHC: 31.6 g/dL (ref 30.0–36.0)
MCV: 90.6 fL (ref 78.0–100.0)
PLATELETS: 257 10*3/uL (ref 150–400)
RBC: 4.36 MIL/uL (ref 4.22–5.81)
RDW: 14.9 % (ref 11.5–15.5)
WBC: 5.9 10*3/uL (ref 4.0–10.5)

## 2017-12-19 LAB — BASIC METABOLIC PANEL
Anion gap: 11 (ref 5–15)
BUN: 17 mg/dL (ref 6–20)
CALCIUM: 8.6 mg/dL — AB (ref 8.9–10.3)
CO2: 28 mmol/L (ref 22–32)
CREATININE: 1.09 mg/dL (ref 0.61–1.24)
Chloride: 104 mmol/L (ref 98–111)
GFR calc non Af Amer: >60 mL/min (ref >60)
Glucose, Bld: 140 mg/dL — ABNORMAL HIGH (ref 70–99)
Potassium: 4.2 mmol/L (ref 3.5–5.1)
SODIUM: 143 mmol/L (ref 135–145)

## 2017-12-19 LAB — GLUCOSE, CAPILLARY: GLUCOSE-CAPILLARY: 163 mg/dL — AB (ref 70–99)

## 2017-12-19 NOTE — Progress Notes (Signed)
   12/19/17 1328  OBSTRUCTIVE SLEEP APNEA  Score 5 or greater  Results sent to PCP

## 2017-12-20 LAB — HEMOGLOBIN A1C
Hgb A1c MFr Bld: 6.6 % — ABNORMAL HIGH (ref 4.8–5.6)
MEAN PLASMA GLUCOSE: 143 mg/dL

## 2017-12-25 MED ORDER — DEXTROSE 5 % IV SOLN
3.0000 g | INTRAVENOUS | Status: AC
  Administered 2017-12-26: 3 g via INTRAVENOUS
  Filled 2017-12-25: qty 3

## 2017-12-26 ENCOUNTER — Inpatient Hospital Stay (HOSPITAL_COMMUNITY): Payer: Medicare HMO | Admitting: Anesthesiology

## 2017-12-26 ENCOUNTER — Other Ambulatory Visit: Payer: Self-pay

## 2017-12-26 ENCOUNTER — Encounter (HOSPITAL_COMMUNITY)
Admission: RE | Disposition: A | Discharge status: Home / Self Care | Payer: Self-pay | Source: Ambulatory Visit | Attending: Orthopaedic Surgery

## 2017-12-26 ENCOUNTER — Encounter (HOSPITAL_COMMUNITY): Payer: Self-pay | Admitting: Emergency Medicine

## 2017-12-26 ENCOUNTER — Inpatient Hospital Stay (HOSPITAL_COMMUNITY)
Admission: RE | Admit: 2017-12-26 | Discharge: 2017-12-27 | DRG: 470 | Disposition: A | Discharge status: Home / Self Care | Payer: Medicare HMO | Source: Ambulatory Visit | Attending: Orthopaedic Surgery | Admitting: Orthopaedic Surgery

## 2017-12-26 ENCOUNTER — Inpatient Hospital Stay (HOSPITAL_COMMUNITY): Payer: Medicare HMO

## 2017-12-26 DIAGNOSIS — E119 Type 2 diabetes mellitus without complications: Secondary | ICD-10-CM | POA: Diagnosis present

## 2017-12-26 DIAGNOSIS — F17200 Nicotine dependence, unspecified, uncomplicated: Secondary | ICD-10-CM | POA: Diagnosis not present

## 2017-12-26 DIAGNOSIS — Z419 Encounter for procedure for purposes other than remedying health state, unspecified: Secondary | ICD-10-CM

## 2017-12-26 DIAGNOSIS — M1612 Unilateral primary osteoarthritis, left hip: Secondary | ICD-10-CM | POA: Diagnosis not present

## 2017-12-26 DIAGNOSIS — E785 Hyperlipidemia, unspecified: Secondary | ICD-10-CM | POA: Diagnosis not present

## 2017-12-26 DIAGNOSIS — Z96642 Presence of left artificial hip joint: Secondary | ICD-10-CM

## 2017-12-26 DIAGNOSIS — J45909 Unspecified asthma, uncomplicated: Secondary | ICD-10-CM | POA: Diagnosis not present

## 2017-12-26 DIAGNOSIS — Z885 Allergy status to narcotic agent status: Secondary | ICD-10-CM | POA: Diagnosis not present

## 2017-12-26 DIAGNOSIS — I10 Essential (primary) hypertension: Secondary | ICD-10-CM | POA: Diagnosis not present

## 2017-12-26 DIAGNOSIS — Z91013 Allergy to seafood: Secondary | ICD-10-CM | POA: Diagnosis not present

## 2017-12-26 DIAGNOSIS — E291 Testicular hypofunction: Secondary | ICD-10-CM | POA: Diagnosis present

## 2017-12-26 DIAGNOSIS — Z6841 Body Mass Index (BMI) 40.0 and over, adult: Secondary | ICD-10-CM

## 2017-12-26 DIAGNOSIS — Z9181 History of falling: Secondary | ICD-10-CM | POA: Diagnosis not present

## 2017-12-26 HISTORY — PX: TOTAL HIP ARTHROPLASTY: SHX124

## 2017-12-26 LAB — GLUCOSE, CAPILLARY
GLUCOSE-CAPILLARY: 116 mg/dL — AB (ref 70–99)
GLUCOSE-CAPILLARY: 92 mg/dL (ref 70–99)
GLUCOSE-CAPILLARY: 122 mg/dL — AB (ref 70–99)

## 2017-12-26 SURGERY — ARTHROPLASTY, HIP, TOTAL, ANTERIOR APPROACH
Anesthesia: Spinal | Site: Hip | Laterality: Left

## 2017-12-26 MED ORDER — PROPOFOL 10 MG/ML IV BOLUS
INTRAVENOUS | Status: AC
  Filled 2017-12-26: qty 40

## 2017-12-26 MED ORDER — ACETAMINOPHEN 325 MG PO TABS: 325.0000 mg | ORAL_TABLET | Freq: Four times a day (QID) | ORAL | Status: DC | PRN

## 2017-12-26 MED ORDER — FENTANYL CITRATE (PF) 100 MCG/2ML IJ SOLN: 25.0000 ug | INTRAMUSCULAR | Status: DC | PRN

## 2017-12-26 MED ORDER — METOCLOPRAMIDE HCL 5 MG PO TABS: 5.0000 mg | ORAL_TABLET | Freq: Three times a day (TID) | ORAL | Status: DC | PRN

## 2017-12-26 MED ORDER — PANTOPRAZOLE SODIUM 40 MG PO TBEC
40.0000 mg | DELAYED_RELEASE_TABLET | Freq: Every day | ORAL | Status: DC
  Administered 2017-12-27: 40 mg via ORAL
  Filled 2017-12-26 (×2): qty 1

## 2017-12-26 MED ORDER — ALBUTEROL SULFATE (2.5 MG/3ML) 0.083% IN NEBU: 2.5000 mg | INHALATION_SOLUTION | Freq: Four times a day (QID) | RESPIRATORY_TRACT | Status: DC | PRN

## 2017-12-26 MED ORDER — MONTELUKAST SODIUM 10 MG PO TABS
10.0000 mg | ORAL_TABLET | Freq: Every day | ORAL | Status: DC
  Administered 2017-12-26: 10 mg via ORAL
  Filled 2017-12-26: qty 1

## 2017-12-26 MED ORDER — ASPIRIN 81 MG PO CHEW
81.0000 mg | CHEWABLE_TABLET | Freq: Two times a day (BID) | ORAL | Status: DC
  Administered 2017-12-26 – 2017-12-27 (×2): 81 mg via ORAL
  Filled 2017-12-26 (×2): qty 1

## 2017-12-26 MED ORDER — BUPIVACAINE IN DEXTROSE 0.75-8.25 % IT SOLN
INTRATHECAL | Status: DC | PRN
  Administered 2017-12-26: 2 mL via INTRATHECAL

## 2017-12-26 MED ORDER — 0.9 % SODIUM CHLORIDE (POUR BTL) OPTIME
TOPICAL | Status: DC | PRN
  Administered 2017-12-26: 1000 mL

## 2017-12-26 MED ORDER — PROPOFOL 500 MG/50ML IV EMUL
INTRAVENOUS | Status: DC | PRN
  Administered 2017-12-26: 50 ug/kg/min via INTRAVENOUS

## 2017-12-26 MED ORDER — PROPOFOL 10 MG/ML IV BOLUS
INTRAVENOUS | Status: AC
  Filled 2017-12-26: qty 20

## 2017-12-26 MED ORDER — VERAPAMIL HCL 120 MG PO TABS
60.0000 mg | ORAL_TABLET | Freq: Every day | ORAL | Status: DC
  Administered 2017-12-27: 60 mg via ORAL
  Filled 2017-12-26: qty 0.5

## 2017-12-26 MED ORDER — LACTATED RINGERS IV SOLN: INTRAVENOUS | Status: DC

## 2017-12-26 MED ORDER — MIDAZOLAM HCL 2 MG/2ML IJ SOLN
INTRAMUSCULAR | Status: AC
  Filled 2017-12-26: qty 2

## 2017-12-26 MED ORDER — METFORMIN HCL 500 MG PO TABS
1000.0000 mg | ORAL_TABLET | Freq: Two times a day (BID) | ORAL | Status: DC
  Administered 2017-12-26 – 2017-12-27 (×2): 1000 mg via ORAL
  Filled 2017-12-26 (×2): qty 2

## 2017-12-26 MED ORDER — GABAPENTIN 300 MG PO CAPS
300.0000 mg | ORAL_CAPSULE | Freq: Four times a day (QID) | ORAL | Status: DC
  Administered 2017-12-26 – 2017-12-27 (×4): 300 mg via ORAL
  Filled 2017-12-26 (×4): qty 1

## 2017-12-26 MED ORDER — LISINOPRIL 20 MG PO TABS
20.0000 mg | ORAL_TABLET | Freq: Every day | ORAL | Status: DC
  Administered 2017-12-26 – 2017-12-27 (×2): 20 mg via ORAL
  Filled 2017-12-26 (×2): qty 1

## 2017-12-26 MED ORDER — CHLORHEXIDINE GLUCONATE 4 % EX LIQD: 60.0000 mL | Freq: Once | CUTANEOUS | Status: DC

## 2017-12-26 MED ORDER — ONDANSETRON HCL 4 MG PO TABS: 4.0000 mg | ORAL_TABLET | Freq: Four times a day (QID) | ORAL | Status: DC | PRN

## 2017-12-26 MED ORDER — HYDROMORPHONE HCL 1 MG/ML IJ SOLN
0.5000 mg | INTRAMUSCULAR | Status: DC | PRN
  Administered 2017-12-26: 1 mg via INTRAVENOUS
  Filled 2017-12-26: qty 1

## 2017-12-26 MED ORDER — SODIUM CHLORIDE 0.9 % IV SOLN
INTRAVENOUS | Status: DC | PRN
  Administered 2017-12-26: 25 ug/min via INTRAVENOUS

## 2017-12-26 MED ORDER — ONDANSETRON HCL 4 MG/2ML IJ SOLN: 4.0000 mg | Freq: Four times a day (QID) | INTRAMUSCULAR | Status: DC | PRN

## 2017-12-26 MED ORDER — MENTHOL 3 MG MT LOZG: 1.0000 | LOZENGE | OROMUCOSAL | Status: DC | PRN

## 2017-12-26 MED ORDER — METOCLOPRAMIDE HCL 5 MG/ML IJ SOLN: 5.0000 mg | Freq: Three times a day (TID) | INTRAMUSCULAR | Status: DC | PRN

## 2017-12-26 MED ORDER — SODIUM CHLORIDE 0.9 % IR SOLN
Status: DC | PRN
  Administered 2017-12-26: 1000 mL

## 2017-12-26 MED ORDER — METHOCARBAMOL 500 MG IVPB - SIMPLE MED
500.0000 mg | Freq: Four times a day (QID) | INTRAVENOUS | Status: DC | PRN
  Filled 2017-12-26: qty 50

## 2017-12-26 MED ORDER — BUSPIRONE HCL 5 MG PO TABS
7.5000 mg | ORAL_TABLET | Freq: Two times a day (BID) | ORAL | Status: DC
  Administered 2017-12-26 – 2017-12-27 (×2): 7.5 mg via ORAL
  Filled 2017-12-26 (×2): qty 2

## 2017-12-26 MED ORDER — LACTATED RINGERS IV SOLN
INTRAVENOUS | Status: DC
  Administered 2017-12-26 (×2): via INTRAVENOUS

## 2017-12-26 MED ORDER — OXYCODONE HCL 5 MG PO TABS
10.0000 mg | ORAL_TABLET | ORAL | Status: DC | PRN
  Administered 2017-12-26 – 2017-12-27 (×4): 15 mg via ORAL
  Filled 2017-12-26 (×4): qty 3

## 2017-12-26 MED ORDER — ONDANSETRON HCL 4 MG/2ML IJ SOLN
INTRAMUSCULAR | Status: AC
  Filled 2017-12-26: qty 2

## 2017-12-26 MED ORDER — MOMETASONE FURO-FORMOTEROL FUM 200-5 MCG/ACT IN AERO
2.0000 | INHALATION_SPRAY | Freq: Two times a day (BID) | RESPIRATORY_TRACT | Status: DC
  Filled 2017-12-26: qty 8.8

## 2017-12-26 MED ORDER — CELECOXIB 200 MG PO CAPS
200.0000 mg | ORAL_CAPSULE | Freq: Two times a day (BID) | ORAL | Status: DC
  Administered 2017-12-26 – 2017-12-27 (×2): 200 mg via ORAL
  Filled 2017-12-26 (×3): qty 1

## 2017-12-26 MED ORDER — DOCUSATE SODIUM 100 MG PO CAPS
100.0000 mg | ORAL_CAPSULE | Freq: Two times a day (BID) | ORAL | Status: DC
  Administered 2017-12-26 – 2017-12-27 (×2): 100 mg via ORAL
  Filled 2017-12-26 (×2): qty 1

## 2017-12-26 MED ORDER — POLYETHYLENE GLYCOL 3350 17 G PO PACK: 17.0000 g | PACK | Freq: Every day | ORAL | Status: DC | PRN

## 2017-12-26 MED ORDER — MIDAZOLAM HCL 5 MG/5ML IJ SOLN
INTRAMUSCULAR | Status: DC | PRN
  Administered 2017-12-26 (×2): 2 mg via INTRAVENOUS

## 2017-12-26 MED ORDER — DEXAMETHASONE SODIUM PHOSPHATE 10 MG/ML IJ SOLN
INTRAMUSCULAR | Status: AC
  Filled 2017-12-26: qty 1

## 2017-12-26 MED ORDER — METOCLOPRAMIDE HCL 5 MG/ML IJ SOLN: 10.0000 mg | Freq: Once | INTRAMUSCULAR | Status: DC | PRN

## 2017-12-26 MED ORDER — DIVALPROEX SODIUM 250 MG PO DR TAB
500.0000 mg | DELAYED_RELEASE_TABLET | Freq: Three times a day (TID) | ORAL | Status: DC
  Administered 2017-12-26 – 2017-12-27 (×4): 500 mg via ORAL
  Filled 2017-12-26 (×4): qty 2

## 2017-12-26 MED ORDER — SODIUM CHLORIDE 0.9 % IV SOLN
INTRAVENOUS | Status: DC
  Administered 2017-12-26: 1000 mL via INTRAVENOUS
  Administered 2017-12-26: 17:00:00 via INTRAVENOUS

## 2017-12-26 MED ORDER — PHENOL 1.4 % MT LIQD: 1.0000 | OROMUCOSAL | Status: DC | PRN

## 2017-12-26 MED ORDER — ATORVASTATIN CALCIUM 40 MG PO TABS
40.0000 mg | ORAL_TABLET | Freq: Every day | ORAL | Status: DC
  Administered 2017-12-26: 40 mg via ORAL
  Filled 2017-12-26: qty 1

## 2017-12-26 MED ORDER — ALUM & MAG HYDROXIDE-SIMETH 200-200-20 MG/5ML PO SUSP: 30.0000 mL | ORAL | Status: DC | PRN

## 2017-12-26 MED ORDER — PAROXETINE HCL 20 MG PO TABS
40.0000 mg | ORAL_TABLET | ORAL | Status: DC
  Administered 2017-12-27: 40 mg via ORAL
  Filled 2017-12-26: qty 2

## 2017-12-26 MED ORDER — DIPHENHYDRAMINE HCL 12.5 MG/5ML PO ELIX: 12.5000 mg | ORAL_SOLUTION | ORAL | Status: DC | PRN

## 2017-12-26 MED ORDER — GLIMEPIRIDE 2 MG PO TABS
2.0000 mg | ORAL_TABLET | Freq: Every day | ORAL | Status: DC
  Administered 2017-12-27: 2 mg via ORAL
  Filled 2017-12-26: qty 1

## 2017-12-26 MED ORDER — PROPOFOL 500 MG/50ML IV EMUL
INTRAVENOUS | Status: DC | PRN
  Administered 2017-12-26: 30 mg via INTRAVENOUS

## 2017-12-26 MED ORDER — TRANEXAMIC ACID-NACL 1000-0.7 MG/100ML-% IV SOLN
1000.0000 mg | INTRAVENOUS | Status: AC
  Administered 2017-12-26: 1000 mg via INTRAVENOUS
  Filled 2017-12-26: qty 100

## 2017-12-26 MED ORDER — MEPERIDINE HCL 50 MG/ML IJ SOLN: 6.2500 mg | INTRAMUSCULAR | Status: DC | PRN

## 2017-12-26 MED ORDER — FENTANYL CITRATE (PF) 100 MCG/2ML IJ SOLN
INTRAMUSCULAR | Status: AC
  Filled 2017-12-26: qty 2

## 2017-12-26 MED ORDER — STERILE WATER FOR IRRIGATION IR SOLN
Status: DC | PRN
  Administered 2017-12-26: 3000 mL

## 2017-12-26 MED ORDER — METHOCARBAMOL 500 MG PO TABS
500.0000 mg | ORAL_TABLET | Freq: Four times a day (QID) | ORAL | Status: DC | PRN
  Administered 2017-12-26 – 2017-12-27 (×2): 500 mg via ORAL
  Filled 2017-12-26 (×2): qty 1

## 2017-12-26 MED ORDER — ONDANSETRON HCL 4 MG/2ML IJ SOLN
INTRAMUSCULAR | Status: DC | PRN
  Administered 2017-12-26: 4 mg via INTRAVENOUS

## 2017-12-26 MED ORDER — HYDROCHLOROTHIAZIDE 25 MG PO TABS
25.0000 mg | ORAL_TABLET | Freq: Every day | ORAL | Status: DC
  Administered 2017-12-27: 25 mg via ORAL
  Filled 2017-12-26: qty 1

## 2017-12-26 MED ORDER — OXYCODONE HCL 5 MG PO TABS
5.0000 mg | ORAL_TABLET | ORAL | Status: DC | PRN
  Administered 2017-12-27: 10 mg via ORAL
  Filled 2017-12-26: qty 2

## 2017-12-26 MED ORDER — CEFAZOLIN SODIUM-DEXTROSE 2-4 GM/100ML-% IV SOLN
2.0000 g | Freq: Four times a day (QID) | INTRAVENOUS | Status: AC
  Administered 2017-12-26 – 2017-12-27 (×2): 2 g via INTRAVENOUS
  Filled 2017-12-26 (×2): qty 100

## 2017-12-26 SURGICAL SUPPLY — 41 items
BAG ZIPLOCK 12X15 (MISCELLANEOUS) IMPLANT
BENZOIN TINCTURE PRP APPL 2/3 (GAUZE/BANDAGES/DRESSINGS) IMPLANT
BLADE SAW SGTL 18X1.27X75 (BLADE) ×2 IMPLANT
BLADE SAW SGTL 18X1.27X75MM (BLADE) ×1
CLOSURE WOUND 1/2 X4 (GAUZE/BANDAGES/DRESSINGS)
COVER PERINEAL POST (MISCELLANEOUS) ×3 IMPLANT
COVER SURGICAL LIGHT HANDLE (MISCELLANEOUS) ×3 IMPLANT
COVER WAND RF STERILE (DRAPES) ×3 IMPLANT
CUP ACET PNNCL SECTR W/GRIP 56 (Hips) ×1 IMPLANT
DRAPE INCISE 23X17 IOBAN STRL (DRAPES) ×2
DRAPE INCISE IOBAN 23X17 STRL (DRAPES) ×1 IMPLANT
DRAPE SURG ISO 125X83 STRL (DRAPES) ×3 IMPLANT
DRAPE U-SHAPE 47X51 STRL (DRAPES) ×6 IMPLANT
DRSG AQUACEL AG ADV 3.5X10 (GAUZE/BANDAGES/DRESSINGS) ×3 IMPLANT
ELECT REM PT RETURN 15FT ADLT (MISCELLANEOUS) ×3 IMPLANT
GAUZE XEROFORM 1X8 LF (GAUZE/BANDAGES/DRESSINGS) ×3 IMPLANT
GLOVE BIO SURGEON STRL SZ7.5 (GLOVE) ×3 IMPLANT
GLOVE BIOGEL PI IND STRL 8 (GLOVE) ×2 IMPLANT
GLOVE BIOGEL PI INDICATOR 8 (GLOVE) ×4
GLOVE ECLIPSE 8.0 STRL XLNG CF (GLOVE) ×3 IMPLANT
GOWN STRL REUS W/TWL XL LVL3 (GOWN DISPOSABLE) ×6 IMPLANT
HANDPIECE INTERPULSE COAX TIP (DISPOSABLE) ×2
HEAD CERAMIC DELTA 36 PLUS 1.5 (Hips) ×3 IMPLANT
HOLDER FOLEY CATH W/STRAP (MISCELLANEOUS) ×3 IMPLANT
LINER NEUTRAL 52MMX36MMX56N (Liner) ×3 IMPLANT
PACK ANTERIOR HIP CUSTOM (KITS) ×3 IMPLANT
PINN SECTOR W/GRIP ACE CUP 56 (Hips) ×3 IMPLANT
SET HNDPC FAN SPRY TIP SCT (DISPOSABLE) ×1 IMPLANT
SOAP 2 % CHG 4 OZ (WOUND CARE) ×3 IMPLANT
STAPLER VISISTAT (STAPLE) IMPLANT
STAPLER VISISTAT 35W (STAPLE) ×3 IMPLANT
STEM CORAIL KA12 (Stem) ×3 IMPLANT
STRIP CLOSURE SKIN 1/2X4 (GAUZE/BANDAGES/DRESSINGS) IMPLANT
SUT ETHIBOND NAB CT1 #1 30IN (SUTURE) ×3 IMPLANT
SUT MNCRL AB 4-0 PS2 18 (SUTURE) IMPLANT
SUT VIC AB 0 CT1 36 (SUTURE) ×3 IMPLANT
SUT VIC AB 1 CT1 36 (SUTURE) ×3 IMPLANT
SUT VIC AB 2-0 CT1 27 (SUTURE) ×4
SUT VIC AB 2-0 CT1 TAPERPNT 27 (SUTURE) ×2 IMPLANT
TRAY FOLEY MTR SLVR 16FR STAT (SET/KITS/TRAYS/PACK) ×3 IMPLANT
YANKAUER SUCT BULB TIP 10FT TU (MISCELLANEOUS) ×3 IMPLANT

## 2017-12-26 NOTE — Brief Op Note (Signed)
12/26/2017  2:14 PM  PATIENT:  Frank [name].  52 y.o. male  PRE-OPERATIVE DIAGNOSIS:  osteoarthritis left hip  POST-OPERATIVE DIAGNOSIS:  osteoarthritis left hip  PROCEDURE:  Procedure(s): LEFT TOTAL HIP ARTHROPLASTY ANTERIOR APPROACH (Left)  SURGEON:  Surgeon(s) and Role:    Kathryne Hitch, MD - Primary  PHYSICIAN ASSISTANT: Rexene Edison, PA-C  ANESTHESIA:   spinal  EBL:  350 mL   COUNTS:  YES  DICTATION: .Other Dictation: Dictation Number [id number]  PLAN OF CARE: Admit to inpatient   PATIENT DISPOSITION:  PACU - hemodynamically stable.   Delay start of Pharmacological VTE agent (>24hrs) due to surgical blood loss or risk of bleeding: no

## 2017-12-26 NOTE — Anesthesia Postprocedure Evaluation (Signed)
Anesthesia Post Note  Patient: Frank [name].  Procedure(s) Performed: LEFT TOTAL HIP ARTHROPLASTY ANTERIOR APPROACH (Left Hip)     Patient location during evaluation: PACU Anesthesia Type: Spinal Level of consciousness: awake and alert Pain management: pain level controlled Vital Signs Assessment: post-procedure vital signs reviewed and stable Respiratory status: spontaneous breathing and respiratory function stable Cardiovascular status: blood pressure returned to baseline and stable Postop Assessment: no headache, no backache, spinal receding and no apparent nausea or vomiting Anesthetic complications: no    Last Vitals:  Vitals:   12/26/17 1553 12/26/17 1717  BP: 109/87 (!) 124/95  Pulse: (!) 59 72  Resp:    Temp: 36.4 C 36.4 C  SpO2: 99% 100%    Last Pain:  Vitals:   12/26/17 1717  TempSrc: Oral  PainSc:                  Phillips Grout

## 2017-12-26 NOTE — Transfer of Care (Signed)
Immediate Anesthesia Transfer of Care Note  Patient: Frank [name].  Procedure(s) Performed: LEFT TOTAL HIP ARTHROPLASTY ANTERIOR APPROACH (Left Hip)  Patient Location: PACU  Anesthesia Type:Spinal  Level of Consciousness: sedated and patient cooperative  Airway & Oxygen Therapy: Patient Spontanous Breathing and Patient connected to face mask oxygen  Post-op Assessment: Report given to RN and Post -op Vital signs reviewed and stable  Post vital signs: Reviewed and stable  Last Vitals:  Vitals Value Taken Time  BP 102/67 12/26/2017  2:31 PM  Temp    Pulse 64 12/26/2017  2:34 PM  Resp 17 12/26/2017  2:34 PM  SpO2 99 % 12/26/2017  2:34 PM  Vitals shown include unvalidated device data.  Last Pain:  Vitals:   12/26/17 1147  TempSrc:   PainSc: 7       Patients Stated Pain Goal: 4 (12/26/17 1147)  Complications: No apparent anesthesia complications

## 2017-12-26 NOTE — Anesthesia Procedure Notes (Signed)
Date/Time: 12/26/2017 12:38 PM Performed by: Thornell Mule, CRNA Oxygen Delivery Method: Simple face mask

## 2017-12-26 NOTE — Op Note (Signed)
NAME: Delson, Dulworth MEDICAL RECORD WU:[id number] ACCOUNT [id number] DATE OF BIRTH:03/25/1965 FACILITY: [facility] LOCATION: [facility] PHYSICIAN:Miranda Garber Aretha Parrot, MD  OPERATIVE REPORT  DATE OF PROCEDURE:  12/26/2017  PREOPERATIVE DIAGNOSES:   1.  Primary osteoarthritis and degenerative joint disease, left hip.   2.  Morbid obesity with BMI over 40.  POSTOPERATIVE DIAGNOSES:   1.  Primary osteoarthritis and degenerative joint disease, left hip. 2.  Morbid obesity with BMI over 40.  PROCEDURE:  Left total hip arthroplasty through direct anterior approach.  IMPLANTS:  DePuy Sector Gription acetabular component size 56, size 36+0 neutral polyethylene liner, size 12 Corail femoral component with standard offset, size 36+1.5 ceramic hip ball.  SURGEON:  Vanita Panda. Magnus Ivan, MD  ASSISTANT:  Richardean Canal, PA-C  ANESTHESIA:  Spinal.  ANTIBIOTICS:  3 grams IV Ancef.  ESTIMATED BLOOD LOSS:  350 mL.  COMPLICATIONS:  None.  INDICATIONS:  The patient is a very pleasant 52 year old gentleman with morbid obesity and severe debilitating arthritis involving his left hip.  His right hip is normal, but his left hip has x-rays showing severe end-stage arthritis with bone-on-bone  wear.  His pain is daily and is detrimentally affecting his activities of daily living, his quality of life and his mobility.  He has worked on activity modification and weight loss.  He is a diabetic, but has good blood glucose control.  At this point,  with failure of conservative treatment and the inability to lose more weight, we have recommended total hip arthroplasty.  He is headed in the right direction in terms of weight loss.  I am comfortable proceeding with his hip surgery with his BMI over 40  based on his physical exam as well.  He understands fully the risk of acute blood loss anemia, nerve or vessel injury, fracture, infection, dislocation, DVT as well as implant failure.  He understands all  these are heightened risks given his diabetes and his morbid obesity.  He understands  our goals are to decrease pain, improve mobility and overall improve quality of life.  DESCRIPTION OF PROCEDURE:  After informed consent was obtained and appropriate left hip was marked.  He was brought to the operating room and sat up on the stretcher with a spinal anesthesia being able to be obtained.  He was then laid in the supine  position.  A Foley catheter was placed and traction boots were placed on both his feet.  Of note, his preoperative leg lengths show that he was actually equal.  We then prepped his left hip, with DuraPrep and sterile drapes.  A time-out was called to  identify correct patient, correct left hip.  We then made an incision just inferior and posterior to the anterior superior iliac spine and carried this obliquely down the leg.  We dissected down tensor fascia lata muscle.  Tensor fascia was then divided  longitudinally to proceed with direct anterior approach to the hip.  We identified and cauterized circumflex vessels.  I then identified the hip capsule, opened the hip capsule in an L-type format, finding moderate joint effusion and significant  arthritis around his left hip.  We then placed Cobra retractors around the medial and lateral femoral neck and made our femoral neck cut with an oscillating saw and completed this with an osteotome.  We placed a corkscrew guide in the femoral head and  removed the femoral head in its entirety and found it to be devoid of cartilage.  I then placed a bent Hohmann over  the medial acetabular rim and removed remnants of the acetabular labrum and other debris from the hip.  We then began reaming from a size  43 reamer in stepwise increments up to a size 55 with all reamers under direct visualization, the last reamer under direct fluoroscopy, so we could obtain our depth of reaming our inclination and anteversion.  We then placed the real DePuy Sector   Gription acetabular component size 56 and a 36+0 neutral polyethylene liner for that size acetabular component.  Attention was then turned to the femur.  With the leg externally rotated to 120 degrees, extended and adducted, we were able to place a  Mueller retractor medially and Hohmann retractor behind the greater trochanter, released lateral joint capsule and used a box-cutting osteotome to enter femoral canal and a rongeur to lateralize.  We then began broaching using the Corail broaching system  from a size 8 broach, going up to a size 12.  With a size 12 in place, we trialed a standard offset femoral neck and 36+1.5 hip ball.  We brought the leg back over in upward traction, internal rotation, reducing the pelvis and we were pleased with leg  length, offset, range of motion and stability.  We then dislocated the hip and removed the trial components.  We then were able to place the real Corail femoral component size 12 with standard offset and the real 36+1.5 ceramic hip ball and again reduced  this in the acetabulum.  We were pleased with the leg length, stability offset and range of motion.  We then irrigated the soft tissue with normal saline solution using pulsatile lavage.  We were able to close the joint capsule with interrupted #1  Ethibond suture, followed by running 0 Vicryl and tensor fascia, 0 Vicryl in the deep tissue, 2-0 Vicryl subcutaneous tissue, interrupted staples on the skin.  Xeroform and Aquacel dressing was applied.  He was then taken off the Hana table and taken to  recovery room in stable condition.  All final counts were correct.  There were no complications noted.  Of note, Rexene Edison, PA-C, assisted in the entire case.  His assistance was crucial for facilitating all aspects of this case.  TN/NUANCE  D:12/26/2017 T:12/26/2017 JOB:[id number]

## 2017-12-26 NOTE — Anesthesia Procedure Notes (Signed)
Spinal  Patient location during procedure: OR Start time: 12/26/2017 12:45 PM End time: 12/26/2017 12:50 PM Staffing Anesthesiologist: Montez Hageman, MD Resident/CRNA: Glory Buff, CRNA Performed: resident/CRNA  Preanesthetic Checklist Completed: patient identified, site marked, surgical consent, pre-op evaluation, timeout performed, IV checked, risks and benefits discussed and monitors and equipment checked Spinal Block Patient position: sitting Prep: ChloraPrep Patient monitoring: heart rate, continuous pulse ox and blood pressure Approach: midline Location: L3-4 Injection technique: single-shot Needle Needle type: Pencan  Needle gauge: 24 G Needle length: 9 cm Needle insertion depth: 9 cm Assessment Sensory level: T6 Additional Notes Kit date checked and verified.  Sterile prep and drape with chloraprep due to iodine allergy.  Skin local with 1% xylocaine, stick x 1, - heme, - paraesthesia, + CSF pre and post injection.  Patient tolerated well.

## 2017-12-26 NOTE — Anesthesia Preprocedure Evaluation (Addendum)
Anesthesia Evaluation  Patient identified by MRN, date of birth, ID band Patient awake    Reviewed: Allergy & Precautions, NPO status , Patient's Chart, lab work & pertinent test results  Airway Mallampati: III  TM Distance: >3 FB Neck ROM: Full    Dental no notable dental hx.    Pulmonary asthma , former smoker,    Pulmonary exam normal breath sounds clear to auscultation       Cardiovascular hypertension, Pt. on medications Normal cardiovascular exam Rhythm:Regular Rate:Normal     Neuro/Psych Seizures -, Well Controlled,  negative psych ROS   GI/Hepatic negative GI ROS, Neg liver ROS,   Endo/Other  diabetes, Type obesity  Renal/GU negative Renal ROS  negative genitourinary   Musculoskeletal negative musculoskeletal ROS (+)   Abdominal   Peds negative pediatric ROS (+)  Hematology negative hematology ROS (+)   Anesthesia Other Findings   Reproductive/Obstetrics negative OB ROS                             Anesthesia Physical Anesthesia Plan  ASA: III  Anesthesia Plan: Spinal   Post-op Pain Management:    Induction:   PONV Risk Score and Plan: 1 and Ondansetron and Treatment may vary due to age or medical condition  Airway Management Planned: Simple Face Mask  Additional Equipment:   Intra-op Plan:   Post-operative Plan: Extubation in OR  Informed Consent: I have reviewed the patients History and Physical, chart, labs and discussed the procedure including the risks, benefits and alternatives for the proposed anesthesia with the patient or authorized representative who has indicated his/her understanding and acceptance.   Dental advisory given  Plan Discussed with: CRNA  Anesthesia Plan Comments:         Anesthesia Quick Evaluation

## 2017-12-26 NOTE — H&P (Signed)
TOTAL HIP ADMISSION H&P  Patient is admitted for left total hip arthroplasty.  Subjective:  Chief Complaint: left hip pain  HPI: Frank Signs., 52 y.o. male, has a history of pain and functional disability in the left hip(s) due to arthritis and patient has failed non-surgical conservative treatments for greater than 12 weeks to include NSAID's and/or analgesics, corticosteriod injections, flexibility and strengthening excercises, weight reduction as appropriate and activity modification.  Onset of symptoms was gradual starting 1 years ago with rapidlly worsening course since that time.The patient noted no past surgery on the left hip(s).  Patient currently rates pain in the left hip at 10 out of 10 with activity. Patient has night pain, worsening of pain with activity and weight bearing, trendelenberg gait, pain that interfers with activities of daily living, pain with passive range of motion and crepitus. Patient has evidence of subchondral cysts, subchondral sclerosis, periarticular osteophytes and joint space narrowing by imaging studies. This condition presents safety issues increasing the risk of falls.  There is no current active infection.  Patient Active Problem List   Diagnosis Date Noted  . Morbid (severe) obesity due to excess calories (HCC) 11/27/2017  . Unilateral primary osteoarthritis, left knee 11/12/2017  . Unilateral primary osteoarthritis, left hip 11/12/2017  . Hypogonadism, male 05/08/2016   Past Medical History:  Diagnosis Date  . Asthma   . Current every day smoker   . Depression   . Diabetes mellitus, type II (HCC)   . Hyperlipidemia   . Hypertension   . Morbid obesity (HCC)   . Sedentary lifestyle     Past Surgical History:  Procedure Laterality Date  . OTHER SURGICAL HISTORY     R & L shoulder  . OTHER SURGICAL HISTORY     Knee surgery    Current Facility-Administered Medications  Medication Dose Route Frequency Provider Last Rate Last Dose  .  ceFAZolin (ANCEF) 3 g in dextrose 5 % 50 mL IVPB  3 g Intravenous On Call to OR Wofford, Deirdre Evener, RPH       Allergies  Allergen Reactions  . Morphine And Related Anaphylaxis  . Shellfish Allergy Anaphylaxis    Social History   Tobacco Use  . Smoking status: Former Smoker    Packs/day: 1.50    Years: 25.00    Pack years: 37.50    Last attempt to quit: 12/15/2017    Years since quitting: 0.0  . Smokeless tobacco: Never Used  Substance Use Topics  . Alcohol use: No    Family History  Problem Relation Age of Onset  . Diabetes Mother   . Hypertension Mother   . Heart attack Mother   . Osteoporosis Mother   . Diabetes Father   . Hypertension Father   . Heart attack Father   . Diabetes Sister   . Hypertension Sister   . Diabetes Brother   . Hypertension Brother      Review of Systems  Musculoskeletal: Positive for joint pain.  All other systems reviewed and are negative.   Objective:  Physical Exam  Constitutional: He is oriented to person, place, and time. He appears well-developed and well-nourished.  HENT:  Head: Normocephalic and atraumatic.  Eyes: Pupils are equal, round, and reactive to light. EOM are normal.  Neck: Normal range of motion. Neck supple.  Cardiovascular: Normal rate.  Respiratory: Effort normal and breath sounds normal.  GI: Soft. Bowel sounds are normal.  Musculoskeletal:       Left hip: He exhibits  decreased range of motion, decreased strength, tenderness and bony tenderness.  Neurological: He is alert and oriented to person, place, and time.  Skin: Skin is warm and dry.  Psychiatric: He has a normal mood and affect.    Vital signs in last 24 hours:    Labs:   Estimated body mass index is 41.82 kg/m as calculated from the following:   Height as of 12/19/17: 6' (1.829 m).   Weight as of 12/19/17: 139.9 kg.   Imaging Review Plain radiographs demonstrate severe degenerative joint disease of the left hip(s). The bone quality appears to be  excellent for age and reported activity level.    Preoperative templating of the joint replacement has been completed, documented, and submitted to the Operating Room personnel in order to optimize intra-operative equipment management.     Assessment/Plan:  End stage arthritis, left hip(s)  The patient history, physical examination, clinical judgement of the provider and imaging studies are consistent with end stage degenerative joint disease of the left hip(s) and total hip arthroplasty is deemed medically necessary. The treatment options including medical management, injection therapy, arthroscopy and arthroplasty were discussed at length. The risks and benefits of total hip arthroplasty were presented and reviewed. The risks due to aseptic loosening, infection, stiffness, dislocation/subluxation,  thromboembolic complications and other imponderables were discussed.  The patient acknowledged the explanation, agreed to proceed with the plan and consent was signed. Patient is being admitted for inpatient treatment for surgery, pain control, PT, OT, prophylactic antibiotics, VTE prophylaxis, progressive ambulation and ADL's and discharge planning.The patient is planning to be discharged home with home health services

## 2017-12-27 ENCOUNTER — Encounter (HOSPITAL_COMMUNITY): Payer: Self-pay | Admitting: Orthopaedic Surgery

## 2017-12-27 LAB — CBC
HEMATOCRIT: 36.4 % — AB (ref 39.0–52.0)
HEMOGLOBIN: 11.2 g/dL — AB (ref 13.0–17.0)
MCH: 28.3 pg (ref 26.0–34.0)
MCHC: 30.8 g/dL (ref 30.0–36.0)
MCV: 91.9 fL (ref 80.0–100.0)
NRBC: 0 % (ref 0.0–0.2)
Platelets: 271 10*3/uL (ref 150–400)
RBC: 3.96 MIL/uL — ABNORMAL LOW (ref 4.22–5.81)
RDW: 14.6 % (ref 11.5–15.5)
WBC: 6.5 10*3/uL (ref 4.0–10.5)

## 2017-12-27 LAB — BASIC METABOLIC PANEL
ANION GAP: 7 (ref 5–15)
BUN: 14 mg/dL (ref 6–20)
CALCIUM: 8.3 mg/dL — AB (ref 8.9–10.3)
CHLORIDE: 103 mmol/L (ref 98–111)
CO2: 29 mmol/L (ref 22–32)
CREATININE: 1 mg/dL (ref 0.61–1.24)
GLUCOSE: 113 mg/dL — AB (ref 70–99)
Potassium: 4.3 mmol/L (ref 3.5–5.1)
Sodium: 139 mmol/L (ref 135–145)

## 2017-12-27 MED ORDER — OXYCODONE HCL 5 MG PO TABS: 5.0000 mg | ORAL_TABLET | ORAL | 0 refills | Status: DC | PRN

## 2017-12-27 MED ORDER — METHOCARBAMOL 500 MG PO TABS: 500.0000 mg | ORAL_TABLET | Freq: Four times a day (QID) | ORAL | 0 refills | Status: DC | PRN

## 2017-12-27 MED ORDER — ASPIRIN 81 MG PO CHEW: 81.0000 mg | CHEWABLE_TABLET | Freq: Two times a day (BID) | ORAL | 0 refills | Status: DC

## 2017-12-27 NOTE — Progress Notes (Signed)
Patient ID: Frank Signs., male   DOB: [birth year], 52 y.o.   MRN: [id number] Since he is doing so well, he can actually go home this afternoon.  He and his family agree as well.

## 2017-12-27 NOTE — Progress Notes (Signed)
Subjective: 1 Day Post-Op Procedure(s) (LRB): LEFT TOTAL HIP ARTHROPLASTY ANTERIOR APPROACH (Left) Patient reports pain as moderate.    Objective: Vital signs in last 24 hours: Temp:  [97.3 F (36.3 C)-98.3 F (36.8 C)] 98 F (36.7 C) (10/12 0939) Pulse Rate:  [55-87] 79 (10/12 0939) Resp:  [12-18] 18 (10/12 0939) BP: (95-145)/(53-111) 128/90 (10/12 0939) SpO2:  [94 %-100 %] 99 % (10/12 0939)  Intake/Output from previous day: 10/11 0701 - 10/12 0700 In: 4069.9 [P.O.:540; I.V.:3179.9; IV Piggyback:350] Out: 2250 [Urine:1900; Blood:350] Intake/Output this shift: Total I/O In: 240 [P.O.:240] Out: 375 [Urine:375]  Recent Labs    12/27/17 0404  HGB 11.2*   Recent Labs    12/27/17 0404  WBC 6.5  RBC 3.96*  HCT 36.4*  PLT 271   Recent Labs    12/27/17 0404  NA 139  K 4.3  CL 103  CO2 29  BUN 14  CREATININE 1.00  GLUCOSE 113*  CALCIUM 8.3*   No results for input(s): LABPT, INR in the last 72 hours.  Sensation intact distally Intact pulses distally Dorsiflexion/Plantar flexion intact Incision: scant drainage  Assessment/Plan: 1 Day Post-Op Procedure(s) (LRB): LEFT TOTAL HIP ARTHROPLASTY ANTERIOR APPROACH (Left) Up with therapy Plan for discharge tomorrow Discharge home with home health    Kathryne Hitch 12/27/2017, 12:13 PM

## 2017-12-27 NOTE — Plan of Care (Signed)
Pt. Is stable. Plane of care reviewed with pt. Pain management in progress, effective.  Problem: Activity: Goal: Ability to avoid complications of mobility impairment will improve Outcome: Progressing Goal: Ability to tolerate increased activity will improve Outcome: Progressing   Problem: Clinical Measurements: Goal: Postoperative complications will be avoided or minimized Outcome: Progressing   Problem: Pain Management: Goal: Pain level will decrease with appropriate interventions Outcome: Progressing

## 2017-12-27 NOTE — Discharge Summary (Signed)
Patient ID: Frank [name]. MRN: [id number] DOB/AGE: [birth year] 53 y.o.  Admit date: 12/26/2017 Discharge date: 12/27/2017  Admission Diagnoses:  Principal Problem:   Unilateral primary osteoarthritis, left hip Active Problems:   Status post total replacement of left hip   Discharge Diagnoses:  Same  Past Medical History:  Diagnosis Date  . Asthma   . Current every day smoker   . Depression   . Diabetes mellitus, type II (HCC)   . Hyperlipidemia   . Hypertension   . Morbid obesity (HCC)   . Sedentary lifestyle     Surgeries: Procedure(s): LEFT TOTAL HIP ARTHROPLASTY ANTERIOR APPROACH on 12/26/2017   Consultants:   Discharged Condition: Improved  Hospital Course: Khalen Styer. is an 52 y.o. male who was admitted 12/26/2017 for operative treatment ofUnilateral primary osteoarthritis, left hip. Patient has severe unremitting pain that affects sleep, daily activities, and work/hobbies. After pre-op clearance the patient was taken to the operating room on 12/26/2017 and underwent  Procedure(s): LEFT TOTAL HIP ARTHROPLASTY ANTERIOR APPROACH.    Patient was given perioperative antibiotics:  Anti-infectives (From admission, onward)   Start     Dose/Rate Route Frequency Ordered Stop   12/26/17 1900  ceFAZolin (ANCEF) IVPB 2g/100 mL premix     2 g 200 mL/hr over 30 Minutes Intravenous Every 6 hours 12/26/17 1550 12/27/17 0050   12/26/17 0600  ceFAZolin (ANCEF) 3 g in dextrose 5 % 50 mL IVPB     3 g 100 mL/hr over 30 Minutes Intravenous On call to O.R. 12/25/17 1513 12/26/17 1321       Patient was given sequential compression devices, early ambulation, and chemoprophylaxis to prevent DVT.  Patient benefited maximally from hospital stay and there were no complications.    Recent vital [name]:  Patient Vitals for the past 24 hrs:  BP Temp Temp src Pulse Resp SpO2  12/27/17 0939 128/90 98 F (36.7 C) Oral 79 18 99 %  12/27/17 0517 107/72 97.7 F (36.5 C) Oral 66  17 97 %  12/27/17 0152 119/78 98.1 F (36.7 C) Oral 70 - 98 %  12/26/17 2131 (!) 121/111 (!) 97.4 F (36.3 C) Oral 87 18 100 %  12/26/17 1855 (!) 144/102 (!) 97.3 F (36.3 C) Oral 81 - 94 %  12/26/17 1808 (!) 145/99 97.7 F (36.5 C) Oral 77 - 99 %  12/26/17 1717 (!) 124/95 97.6 F (36.4 C) Oral 72 - 100 %  12/26/17 1553 109/87 97.6 F (36.4 C) Oral (!) 59 - 99 %  12/26/17 1534 - - - - - 100 %  12/26/17 1530 107/80 97.9 F (36.6 C) - 60 17 100 %  12/26/17 1515 119/89 - - 65 16 100 %  12/26/17 1500 120/80 - - (!) 58 13 96 %  12/26/17 1453 (!) 108/53 - - - - -  12/26/17 1450 105/77 - - - - -  12/26/17 1445 95/66 - - (!) 55 16 96 %  12/26/17 1443 120/80 - - - - -  12/26/17 1432 102/67 98.3 F (36.8 C) - 66 12 99 %     Recent laboratory studies:  Recent Labs    12/27/17 0404  WBC 6.5  HGB 11.2*  HCT 36.4*  PLT 271  NA 139  K 4.3  CL 103  CO2 29  BUN 14  CREATININE 1.00  GLUCOSE 113*  CALCIUM 8.3*     Discharge Medications:   Allergies as of 12/27/2017      Reactions  Morphine And Related Anaphylaxis   Shellfish Allergy Anaphylaxis      Medication List    STOP taking these medications   aspirin EC 81 MG tablet Replaced by:  aspirin 81 MG chewable tablet     TAKE these medications   albuterol 108 (90 Base) MCG/ACT inhaler Commonly known as:  PROVENTIL HFA;VENTOLIN HFA Inhale 2 puffs into the lungs every 6 (six) hours as needed for wheezing or shortness of breath.   aspirin 81 MG chewable tablet Chew 1 tablet (81 mg total) by mouth 2 (two) times daily. Replaces:  aspirin EC 81 MG tablet   atorvastatin 40 MG tablet Commonly known as:  LIPITOR Take 40 mg by mouth at bedtime.   busPIRone 7.5 MG tablet Commonly known as:  BUSPAR Take 7.5 mg by mouth 2 (two) times daily.   celecoxib 200 MG capsule Commonly known as:  CELEBREX Take 200 mg by mouth 2 (two) times daily.   divalproex 500 MG DR tablet Commonly known as:  DEPAKOTE Take 500 mg by mouth 3  (three) times daily.   esomeprazole 40 MG capsule Commonly known as:  NEXIUM Take 40 mg by mouth daily.   Fluticasone-Salmeterol 500-50 MCG/DOSE Aepb Commonly known as:  ADVAIR Inhale 1 puff into the lungs 2 (two) times daily.   gabapentin 300 MG capsule Commonly known as:  NEURONTIN Take 300 mg by mouth 4 (four) times daily.   glimepiride 2 MG tablet Commonly known as:  AMARYL Take 2 mg by mouth daily with breakfast.   hydrochlorothiazide 25 MG tablet Commonly known as:  HYDRODIURIL Take 25 mg by mouth daily.   lisinopril 20 MG tablet Commonly known as:  PRINIVIL,ZESTRIL Take 20 mg by mouth daily.   metFORMIN 1000 MG tablet Commonly known as:  GLUCOPHAGE Take 1,000 mg by mouth 2 (two) times daily with a meal.   methocarbamol 500 MG tablet Commonly known as:  ROBAXIN Take 1 tablet (500 mg total) by mouth every 6 (six) hours as needed for muscle spasms.   montelukast 10 MG tablet Commonly known as:  SINGULAIR Take 10 mg by mouth at bedtime.   OVER THE COUNTER MEDICATION Take 1 capsule by mouth 2 (two) times daily. Burdock Root Supplement   oxyCODONE 5 MG immediate release tablet Commonly known as:  Oxy IR/ROXICODONE Take 1-2 tablets (5-10 mg total) by mouth every 4 (four) hours as needed for moderate pain (pain score 4-6).   PARoxetine 40 MG tablet Commonly known as:  PAXIL Take 40 mg by mouth every morning.   SYRINGE 3CC/21GX1" 21G X 1" 3 ML Misc Used to inject testosterone every 14 days   Syringe/Needle (Disp) 25G X 1" 1 ML Misc Use as directed   testosterone cypionate 200 MG/ML injection Commonly known as:  DEPOTESTOSTERONE CYPIONATE Inject 0.5 mLs (100 mg total) into the muscle every 14 (fourteen) days.   verapamil 120 MG tablet Commonly known as:  CALAN Take 60 mg by mouth daily.            Durable Medical Equipment  (From admission, onward)         Start     Ordered   12/26/17 1550  DME 3 n 1  Once     12/26/17 1550   12/26/17 1550  DME  Walker rolling  Once    Question:  Patient needs a walker to treat with the following condition  Answer:  Status post total replacement of left hip   12/26/17 1550  Diagnostic Studies: Dg Pelvis Portable  Result Date: 12/26/2017 CLINICAL DATA:  Left hip replacement EXAM: PORTABLE PELVIS 1-2 VIEWS COMPARISON:  08/05/2017 FINDINGS: Left hip replacement in satisfactory position and alignment. No fracture or immediate complication. IMPRESSION: Satisfactory left hip replacement Electronically Signed   By: Marlan Palau M.D.   On: 12/26/2017 16:01   Dg C-arm 1-60 Min-no Report  Result Date: 12/26/2017 Fluoroscopy was utilized by the requesting physician.  No radiographic interpretation.   Dg Hip Operative Unilat W Or W/o Pelvis Left  Result Date: 12/26/2017 CLINICAL DATA:  LEFT hip replacement. EXAM: OPERATIVE LEFT HIP (WITH PELVIS IF PERFORMED) 5 VIEWS TECHNIQUE: Fluoroscopic spot image(s) were submitted for interpretation post-operatively. COMPARISON:  None. FINDINGS: Intraoperative fluoroscopy views show LEFT hip arthroplasty hardware placement. Hardware appears intact and appropriately positioned. Osseous alignment is anatomic. Fluoroscopy provided for 25 seconds.  8.8 mGy. IMPRESSION: Intraoperative fluoroscopic images demonstrating surgical changes of LEFT hip arthroplasty. No evidence of surgical complicating feature. Electronically Signed   By: Bary Richard M.D.   On: 12/26/2017 14:32    Disposition: Discharge disposition: 01-Home or Self Care         Follow-up Information    Kathryne Hitch, MD Follow up in 2 week(s).   Specialty:  Orthopedic Surgery Contact information: [address] [place] [address] [phone]            Signed: Kathryne Hitch 12/27/2017, 12:30 PM

## 2017-12-27 NOTE — Evaluation (Signed)
Occupational Therapy Evaluation Patient Details Name: Frank [name]. MRN: [id number] DOB: 1965-03-21 Today's Date: 12/27/2017    History of Present Illness 52 yo male s/p L THA-direct anterior 12/26/17. Hx of morbid obesity, DM   Clinical Impression   OT education complete.  Wife will A as needed    Follow Up Recommendations  No OT follow up    Equipment Recommendations  None recommended by OT    Recommendations for Other Services       Precautions / Restrictions Precautions Precautions: Fall Restrictions Weight Bearing Restrictions: No Other Position/Activity Restrictions: WBAT      Mobility Bed Mobility               General bed mobility comments: pt siting EOB  Transfers Overall transfer level: Needs assistance Equipment used: Rolling walker (2 wheeled) Transfers: Sit to/from [employer] Sit to Stand: Supervision Stand pivot transfers: Supervision            Balance Overall balance assessment: Mild deficits observed, not formally tested                                         ADL either performed or assessed with clinical judgement   ADL Overall ADL's : Needs assistance/impaired Eating/Feeding: Set up;Sitting   Grooming: Set up;Sitting   Upper Body Bathing: Set up;Sitting   Lower Body Bathing: Minimal assistance;Sit to/from stand       Lower Body Dressing: Set up   Toilet Transfer: Set up   Toileting- Clothing Manipulation and Hygiene: Minimal assistance;Sit to/from stand   Tub/ Engineer, structural: Education officer, environmental Details (indicate cue type and reason): verbalized safety         Vision Patient Visual Report: No change from baseline              Pertinent Vitals/Pain Pain Score: 3  Pain Location: L hip Pain Descriptors / Indicators: Discomfort Pain Intervention(s): Limited activity within patient's tolerance;Repositioned     Hand Dominance     Extremity/Trunk  Assessment Upper Extremity Assessment Upper Extremity Assessment: Overall WFL for tasks assessed           Communication Communication Communication: No difficulties   Cognition Arousal/Alertness: Awake/alert Behavior During Therapy: WFL for tasks assessed/performed Overall Cognitive Status: Within Functional Limits for tasks assessed                                                Home Living Family/patient expects to be discharged to:: Private residence Living Arrangements: Spouse/significant other   Type of Home: House Home Access: Level entry     Home Layout: One level               Home Equipment: Bedside commode;Shower seat;Walker - 2 wheels;Wheelchair - [employer] - 4 wheels          Prior Functioning/Environment Level of Independence: Independent with assistive device(s)        Comments: uses RW PRN                 OT Goals(Current goals can be found in the care plan section) Acute Rehab OT Goals Patient Stated Goal: regain mobility  OT Frequency:      AM-PAC PT "6 Clicks" Daily Activity  Outcome Measure Help from another person eating meals?: None Help from another person taking care of personal grooming?: None Help from another person toileting, which includes using toliet, bedpan, or urinal?: A Little Help from another person bathing (including washing, rinsing, drying)?: None Help from another person to put on and taking off regular upper body clothing?: None Help from another person to put on and taking off regular lower body clothing?: A Little 6 Click Score: 22   End of Session Equipment Utilized During Treatment: Rolling walker Nurse Communication: Mobility status  Activity Tolerance: Patient tolerated treatment well Patient left: in chair                   Time: 1610-9604 OT Time Calculation (min): 10 min Charges:  OT General Charges $OT Visit: 1 Visit OT Evaluation $OT Eval Low Complexity: 1  Low  Lise Auer, OT Acute Rehabilitation Services [phone] Office- [phone]     Frank [name], Frank [name] 12/27/2017, 2:40 PM

## 2017-12-27 NOTE — Care Management Note (Addendum)
Case Management Note  Patient Details  Name: Delos Klich. MRN: [id number] Date of Birth: [birth year]  Subjective/Objective:    S/p Left THA                Action/Plan: NCM spoke to pt and states he has RW at home. Pt states he will do HEP, declined HH.   Expected Discharge Date:  12/27/17               Expected Discharge Plan:  Home/Self Care  In-House Referral:  NA  Discharge planning Services  CM Consult  Post Acute Care Choice:  NA Choice offered to:  NA  DME Arranged:  N/A DME Agency:  NA  HH Arranged:  NA HH Agency:  NA  Status of Service:  Completed, signed off  If discussed at Long Length of Stay Meetings, dates discussed:    Additional Comments:  Elliot Cousin, RN 12/27/2017, 1:08 PM

## 2017-12-27 NOTE — Progress Notes (Signed)
Pt d/c home with family. No needs at time of d/c. Pt medicated for pain prior to d/c. Will continue to monitor.

## 2017-12-27 NOTE — Evaluation (Addendum)
Physical Therapy Evaluation Patient Details Name: Frank [name]. MRN: [id number] DOB: [birth year] Today's Date: 12/27/2017   History of Present Illness  52 yo male s/p L THA-direct anterior 12/26/17. Hx of morbid obesity, DM  Clinical Impression  On eval, pt was Min guard assist for mobility. He walked ~115 feet with a RW. Pain was rated 7/10 with activity. Anticipate pt will progress well. Wife was present during session. Will await MD visit to see if pt will be discharged on today. Will progress activity as tolerated.   Addendum: Per chart, MD has decided to d/c pt today. Issued HEP for pt to perform 2x/day.     Follow Up Recommendations Follow surgeon's recommendation for DC plan and follow-up therapies    Equipment Recommendations  None recommended by PT    Recommendations for Other Services       Precautions / Restrictions Precautions Precautions: Fall Restrictions Weight Bearing Restrictions: No Other Position/Activity Restrictions: WBAT      Mobility  Bed Mobility Overal bed mobility: Needs Assistance Bed Mobility: Supine to Sit     Supine to sit: Supervision        Transfers Overall transfer level: Needs assistance Equipment used: Rolling walker (2 wheeled) Transfers: Sit to/from Stand Sit to Stand: Min guard         General transfer comment: close guard for safety. VCS safety, hand placement.   Ambulation/Gait Ambulation/Gait assistance: Min guard Gait Distance (Feet): 115 Feet Assistive device: Rolling walker (2 wheeled) Gait Pattern/deviations: Step-to pattern     General Gait Details: Close guard for safety. VCs safety, sequence  Stairs            Wheelchair Mobility    Modified Rankin (Stroke Patients Only)       Balance Overall balance assessment: Mild deficits observed, not formally tested                                           Pertinent Vitals/Pain Pain Assessment: 0-10 Pain Score: 7  Pain  Location: L hip Pain Descriptors / Indicators: Sore;Tightness;Discomfort Pain Intervention(s): Monitored during session;Repositioned;Ice applied    Home Living Family/patient expects to be discharged to:: Private residence Living Arrangements: Spouse/significant other   Type of Home: House Home Access: Level entry     Home Layout: One level Home Equipment: Bedside commode;Shower seat;Walker - 2 wheels;Wheelchair - [employer] - 4 wheels      Prior Function Level of Independence: Independent with assistive device(s)         Comments: uses RW PRN     Hand Dominance        Extremity/Trunk Assessment   Upper Extremity Assessment Upper Extremity Assessment: Defer to OT evaluation    Lower Extremity Assessment Lower Extremity Assessment: Generalized weakness(s/p L THA)    Cervical / Trunk Assessment Cervical / Trunk Assessment: Normal  Communication   Communication: No difficulties  Cognition Arousal/Alertness: Awake/alert Behavior During Therapy: WFL for tasks assessed/performed Overall Cognitive Status: Within Functional Limits for tasks assessed                                        General Comments      Exercises Total Joint Exercises Ankle Circles/Pumps: AROM;Both;10 reps;Supine Quad Sets: AROM;Both;10 reps;Supine Heel Slides: AAROM;Left;10 reps;Supine Hip ABduction/ADduction: AAROM;Left;10 reps;Supine  Assessment/Plan    PT Assessment Patient needs continued PT services  PT Problem List Decreased strength;Decreased range of motion;Decreased mobility;Decreased balance;Decreased knowledge of use of DME;Decreased activity tolerance;Pain       PT Treatment Interventions DME instruction;Gait training;Functional mobility training;Therapeutic activities;Balance training;Patient/family education;Stair training;Therapeutic exercise    PT Goals (Current goals can be found in the Care Plan section)  Acute Rehab PT Goals Patient Stated  Goal: regain mobility PT Goal Formulation: With patient/family Time For Goal Achievement: 01/10/18 Potential to Achieve Goals: Good    Frequency 7X/week   Barriers to discharge        Co-evaluation               AM-PAC PT "6 Clicks" Daily Activity  Outcome Measure Difficulty turning over in bed (including adjusting bedclothes, sheets and blankets)?: A Little Difficulty moving from lying on back to sitting on the side of the bed? : A Little Difficulty sitting down on and standing up from a chair with arms (e.g., wheelchair, bedside commode, etc,.)?: A Little Help needed moving to and from a bed to chair (including a wheelchair)?: A Little Help needed walking in hospital room?: A Little Help needed climbing 3-5 steps with a railing? : A Little 6 Click Score: 18    End of Session Equipment Utilized During Treatment: Gait belt Activity Tolerance: Patient tolerated treatment well Patient left: in chair;with call bell/phone within reach;with family/visitor present   PT Visit Diagnosis: Pain;Other abnormalities of gait and mobility (R26.89);Difficulty in walking, not elsewhere classified (R26.2) Pain - Right/Left: Left Pain - part of body: Hip    Time: 1610-9604 PT Time Calculation (min) (ACUTE ONLY): 28 min   Charges:   PT Evaluation $PT Eval Low Complexity: 1 Low PT Treatments $Gait Training: 8-22 mins          Rebeca Alert, PT Acute Rehabilitation Services Pager: [phone] Office: [phone]

## 2017-12-27 NOTE — Discharge Instructions (Signed)

## 2018-01-07 ENCOUNTER — Other Ambulatory Visit (INDEPENDENT_AMBULATORY_CARE_PROVIDER_SITE_OTHER): Payer: Self-pay | Admitting: Orthopaedic Surgery

## 2018-01-08 ENCOUNTER — Ambulatory Visit (INDEPENDENT_AMBULATORY_CARE_PROVIDER_SITE_OTHER): Payer: Medicare HMO | Admitting: Orthopaedic Surgery

## 2018-01-08 ENCOUNTER — Encounter (INDEPENDENT_AMBULATORY_CARE_PROVIDER_SITE_OTHER): Payer: Self-pay | Admitting: Orthopaedic Surgery

## 2018-01-08 DIAGNOSIS — Z96642 Presence of left artificial hip joint: Secondary | ICD-10-CM

## 2018-01-08 NOTE — Progress Notes (Signed)
Patient is a 52 year old who is 13 days status post a left total hip arthroplasty.  He is someone with a BMI 42 and his hip arthritis was significantly severely bad to the point where his gait was affected.  At this point he is doing significantly better.  He is already just using a cane and walk better than he has walked in over a year he states.  He is been on aspirin twice a day and at home and go down to once a day.  On exam his incision looks great.  Staples were removed and Steri-Strips applied.  He tolerated his hip the range of motion his leg lengths appear equal.  At this point we will see him back in 4 weeks to see how he is doing overall.  No x-rays will be needed.

## 2018-02-02 ENCOUNTER — Other Ambulatory Visit: Payer: Self-pay | Admitting: "Endocrinology

## 2018-02-05 ENCOUNTER — Ambulatory Visit (INDEPENDENT_AMBULATORY_CARE_PROVIDER_SITE_OTHER): Payer: Medicare HMO

## 2018-02-05 ENCOUNTER — Ambulatory Visit (INDEPENDENT_AMBULATORY_CARE_PROVIDER_SITE_OTHER): Payer: Medicare HMO | Admitting: Orthopaedic Surgery

## 2018-02-05 ENCOUNTER — Encounter (INDEPENDENT_AMBULATORY_CARE_PROVIDER_SITE_OTHER): Payer: Self-pay | Admitting: Orthopaedic Surgery

## 2018-02-05 DIAGNOSIS — M79642 Pain in left hand: Secondary | ICD-10-CM

## 2018-02-05 DIAGNOSIS — Z96642 Presence of left artificial hip joint: Secondary | ICD-10-CM

## 2018-02-05 DIAGNOSIS — M25561 Pain in right knee: Secondary | ICD-10-CM

## 2018-02-05 DIAGNOSIS — M1711 Unilateral primary osteoarthritis, right knee: Secondary | ICD-10-CM

## 2018-02-05 MED ORDER — METHYLPREDNISOLONE ACETATE 40 MG/ML IJ SUSP
40.0000 mg | INTRAMUSCULAR | Status: AC | PRN
  Administered 2018-02-05: 40 mg

## 2018-02-05 MED ORDER — LIDOCAINE HCL 1 % IJ SOLN
1.0000 mL | INTRAMUSCULAR | Status: AC | PRN
  Administered 2018-02-05: 1 mL

## 2018-02-05 MED ORDER — LIDOCAINE HCL 1 % IJ SOLN
3.0000 mL | INTRAMUSCULAR | Status: AC | PRN
  Administered 2018-02-05: 3 mL

## 2018-02-05 MED ORDER — METHYLPREDNISOLONE ACETATE 40 MG/ML IJ SUSP
40.0000 mg | INTRAMUSCULAR | Status: AC | PRN
  Administered 2018-02-05: 40 mg via INTRA_ARTICULAR

## 2018-02-05 NOTE — Progress Notes (Signed)
Office Visit Note   Patient: Frank Signsddie Lee Likins Jr.           Date of Birth: March 18, 1966           MRN: [id number] Visit Date: 02/05/2018              Requested by: Arlina RobesWinfield, Albert Carl, MD [address] [place], [place] [id number] PCP: Arlina RobesWinfield, Albert Carl, MD   Assessment & Plan: Visit Diagnoses:  1. Right knee pain, unspecified chronicity   2. Pain in left hand   3. Status post total replacement of left hip   4. Unilateral primary osteoarthritis, right knee     Plan: I did talk to him about trying an injection of a steroid in his right knee and his left hand of the basilar thumb joint.  I counseled him about how this may affect his blood glucose.  He will watch this closely and reports again good control.  He tolerated injections well both these areas.  He will continue to increase his activities as comfort allows.  He is going to try to still lose weight because the impact is having on his knees and his body and health in general.  We will see him back in 4 weeks to see how is doing overall but no x-rays are needed.  Follow-Up Instructions: Return in about 4 weeks (around 03/05/2018).   Orders:  Orders Placed This Encounter  Procedures  . Large Joint Inj  . Hand/UE Inj  . XR Knee 1-2 Views Right  . XR Hand Complete Left   No orders of the defined types were placed in this encounter.     Procedures: Large Joint Inj: R knee on 02/05/2018 2:04 PM Indications: diagnostic evaluation and pain Details: 22 G 1.5 in needle, superolateral approach  Arthrogram: No  Medications: 3 mL lidocaine 1 %; 40 mg methylPREDNISolone acetate 40 MG/ML Outcome: tolerated well, no immediate complications Procedure, treatment alternatives, risks and benefits explained, specific risks discussed. Consent was given by the patient. Immediately prior to procedure a time out was called to verify the correct patient, procedure, equipment, support staff and site/side marked as required. Patient was prepped  and draped in the usual sterile fashion.   Hand/UE Inj: L thumb CMC for osteoarthritis on 02/05/2018 2:04 PM Medications: 1 mL lidocaine 1 %; 40 mg methylPREDNISolone acetate 40 MG/ML      Clinical Data: No additional findings.   Subjective: Chief Complaint  Patient presents with  . Left Hip - Routine Post Op  The patient is a very pleasant 52 year old gentleman well-known to me.  He is about 6 weeks out from a left total hip arthroplasty.  He is someone who is a diabetic is morbidly obese with a BMI of 41.8 but he lost a lot of weight and we proceed with hip replacement surgery.  He is incredibly happy about his hip replacement and he reports no pain at all.  He is very happy with the hip.  He is been having some right knee pain and discomfort and felt a pop in that knee recently.  He would like to have x-rays of his right knee today.  He has had previous arthroscopic surgery remotely on that knee by someone else.  He is also had pain at the base of his left thumb and grinding and the like to have this looked at as well today.  He denies any numbness and tingling in his hands.  He reports his blood glucose is under  good control.  HPI  Review of Systems He currently denies any headache, chest pain, shortness of breath, fever, chills, nausea, vomiting.  Objective: Vital Signs: There were no vitals taken for this visit.  Physical Exam He is alert and oriented x3 and in no acute distress Ortho Exam Examination of his left operative hip shows fluid and full range of motion with no pain at all.  Examination of both his knee shows varus malalignment of both knees.  His right knee has pain when stressing medial lateral compartments with rotating the tibia on the femur.  His calf is soft bilaterally.  Examination of his left hand shows good pinch and grip strength but they are painful to him.  He has grinding and pain when I stressed the basilar thumb joint. Specialty Comments:  No specialty  comments available.  Imaging: Xr Hand Complete Left  Result Date: 02/05/2018 3 views of the left hand show no acute findings.  There is basilar thumb joint arthritis at the first metacarpal CMC joint.  Xr Knee 1-2 Views Right  Result Date: 02/05/2018 2 views of the right knee shows significant varus malalignment with complete loss of medial joint space and evidence of osteoarthritis throughout all 3 compartments especially patellofemoral joint as well.  There are particular osteophytes in all 3 compartments.    PMFS History: Patient Active Problem List   Diagnosis Date Noted  . Status post total replacement of left hip 12/26/2017  . Morbid (severe) obesity due to excess calories (HCC) 11/27/2017  . Unilateral primary osteoarthritis, left knee 11/12/2017  . Unilateral primary osteoarthritis, left hip 11/12/2017  . Hypogonadism, male 05/08/2016   Past Medical History:  Diagnosis Date  . Asthma   . Current every day smoker   . Depression   . Diabetes mellitus, type II (HCC)   . Hyperlipidemia   . Hypertension   . Morbid obesity (HCC)   . Sedentary lifestyle     Family History  Problem Relation Age of Onset  . Diabetes Mother   . Hypertension Mother   . Heart attack Mother   . Osteoporosis Mother   . Diabetes Father   . Hypertension Father   . Heart attack Father   . Diabetes Sister   . Hypertension Sister   . Diabetes Brother   . Hypertension Brother     Past Surgical History:  Procedure Laterality Date  . OTHER SURGICAL HISTORY     R & L shoulder  . OTHER SURGICAL HISTORY     Knee surgery  . TOTAL HIP ARTHROPLASTY Left 12/26/2017   Procedure: LEFT TOTAL HIP ARTHROPLASTY ANTERIOR APPROACH;  Surgeon: Kathryne Hitch, MD;  Location: [facility];  Service: Orthopedics;  Laterality: Left;   Social History   Occupational History  . Not on file  Tobacco Use  . Smoking status: Former Smoker    Packs/day: 1.50    Years: 25.00    Pack years: 37.50    Last  attempt to quit: 12/15/2017    Years since quitting: 0.1  . Smokeless tobacco: Never Used  Substance and Sexual Activity  . Alcohol use: No  . Drug use: Yes    Types: Marijuana  . Sexual activity: Not on file

## 2018-03-05 ENCOUNTER — Ambulatory Visit (INDEPENDENT_AMBULATORY_CARE_PROVIDER_SITE_OTHER): Payer: Medicare HMO | Admitting: Orthopaedic Surgery

## 2018-03-05 ENCOUNTER — Encounter (INDEPENDENT_AMBULATORY_CARE_PROVIDER_SITE_OTHER): Payer: Self-pay | Admitting: Orthopaedic Surgery

## 2018-03-05 DIAGNOSIS — Z96642 Presence of left artificial hip joint: Secondary | ICD-10-CM

## 2018-03-05 NOTE — Progress Notes (Signed)
The patient is now about 9 weeks status post a left total hip arthroplasty.  He said is doing well and is very pleased overall.  At his last visit we did inject his right knee due to arthritic changes and his left basilar thumb joint.  He said they are doing well overall.  Left thumb is still sore.  He says right hip has no issues in the left operative hip is doing great.  He is walking without assistive device.  He is someone who is morbidly obese at a weight of 308 but his surgery was not difficult for us.  On exam he moves his left hip easily.  His incision looks great.  His leg lengths are equal.  At this point we will continue to work on weight loss and blood glucose control.  We will see him back in 6 months unless he is having issues.  At that visit a like a standing low AP pelvis and lateral of his left operative hip.

## 2018-03-18 HISTORY — PX: CARDIAC CATHETERIZATION: SHX172

## 2018-09-03 ENCOUNTER — Ambulatory Visit: Payer: Self-pay | Admitting: Physician Assistant

## 2018-09-03 ENCOUNTER — Ambulatory Visit (INDEPENDENT_AMBULATORY_CARE_PROVIDER_SITE_OTHER): Payer: Medicare HMO | Admitting: Orthopaedic Surgery

## 2018-09-09 ENCOUNTER — Ambulatory Visit (INDEPENDENT_AMBULATORY_CARE_PROVIDER_SITE_OTHER): Payer: Medicare PPO

## 2018-09-09 ENCOUNTER — Encounter: Payer: Self-pay | Admitting: Physician Assistant

## 2018-09-09 ENCOUNTER — Ambulatory Visit (INDEPENDENT_AMBULATORY_CARE_PROVIDER_SITE_OTHER): Payer: Medicare PPO | Admitting: Physician Assistant

## 2018-09-09 ENCOUNTER — Other Ambulatory Visit: Payer: Self-pay

## 2018-09-09 DIAGNOSIS — Z96642 Presence of left artificial hip joint: Secondary | ICD-10-CM

## 2018-09-09 DIAGNOSIS — M5440 Lumbago with sciatica, unspecified side: Secondary | ICD-10-CM

## 2018-09-09 DIAGNOSIS — M722 Plantar fascial fibromatosis: Secondary | ICD-10-CM | POA: Insufficient documentation

## 2018-09-09 DIAGNOSIS — R200 Anesthesia of skin: Secondary | ICD-10-CM | POA: Diagnosis not present

## 2018-09-09 MED ORDER — CYCLOBENZAPRINE HCL 10 MG PO TABS: 10.0000 mg | ORAL_TABLET | Freq: Every day | ORAL | 0 refills | Status: DC

## 2018-09-09 NOTE — Progress Notes (Signed)
Office Visit Note   Patient: Frank Signsddie Lee Rawdon Jr.           Date of Birth: [birth year]           MRN: [id number] Visit Date: 09/09/2018              Requested by: Arlina RobesWinfield, Albert Carl, MD [address] [place],  [place] [id number] PCP: Arlina RobesWinfield, Albert Carl, MD   Assessment & Plan: Visit Diagnoses:  1. History of left hip replacement   2. Acute midline low back pain with sciatica, sciatica laterality unspecified   3. Bilateral hand numbness   4. Plantar fasciitis of left foot     Plan: We will send him to formal physical therapy for both his low back pain and for his plantar fasciitis.  This will include core strengthening modalities home exercise program and stretching.  In regards to numbness in both hands recommend nerve conduction studies to rule out carpal tunnel as a source of numbness tingling both hands.  He will follow-up after the MRI to go over results.  Its and had some Flexeril for him to take at night.  Moist heat low back.  Discussed with him shoe wear.  In regards to his left total hip arthroplasty we will see him back on an as needed basis.  Follow-Up Instructions: No follow-ups on file.   Orders:  Orders Placed This Encounter  Procedures  . XR HIP UNILAT W OR W/O PELVIS 1V LEFT  . XR Lumbar Spine 2-3 Views  . XR Lumb Spine Flex&Ext Only   Meds ordered this encounter  Medications  . cyclobenzaprine (FLEXERIL) 10 MG tablet    Sig: Take 1 tablet (10 mg total) by mouth at bedtime.    Dispense:  30 tablet    Refill:  0      Procedures: No procedures performed   Clinical Data: No additional findings.   Subjective: Chief Complaint  Patient presents with  . Left Hip - Follow-up    HPI  [name] returns today follow-up of his left total hip arthroplasty he is now 8 months postop.  He states that he is having some pain that feels like a pinching in his back.  He reports that on the June 3 he fell backwards due to losing his balance and has had tingling in  both legs since then tingling does awaken him.  He had no loss consciousness no dizziness time.  Has been taking Tylenol Motrin for this.  He feels that his back pain is becoming worse.  His left total hip overall is doing well.  He denies any saddle anesthesia like symptoms or bowel or bladder dysfunction.  States both legs go numb if he sits for too long.  He also is having numbness in both hands he states that his hands now awaken him and he has to shake them.  He states if he is driving for too long or grasping nausea object for too long has numbness in his hands.  Also he would like for us to look at his left foot as he is having pain in the arch area of his left foot no known injury.   Review of Systems See HPI otherwise negative or noncontributory.  Objective: Vital Signs: There were no vitals taken for this visit.  Physical Exam Constitutional:      Appearance: He is not ill-appearing or diaphoretic.  Cardiovascular:     Pulses: Normal pulses.  Pulmonary:     Effort:  Pulmonary effort is normal.  Neurological:     Mental Status: He is alert and oriented to person, place, and time.  Psychiatric:        Mood and Affect: Mood normal.        Behavior: Behavior normal.     Ortho Exam Bilateral hands he has positive compression test over the median nerve bilaterally.  Negative Tinel's bilaterally over the median nerve.  No thenar atrophy.  No rashes skin lesions ulcerations of either hand.  Radial pulses are 2+ bilaterally.  Sensation grossly intact through bilateral hands. Left foot he has tenderness over the medial tubercle of the calcaneus.  No rashes skin lesions ulcerations of the left foot. Bilateral hips good range of motion without pain.  5 out of 5 strength throughout lower extremities.  Deep tendon reflexes 2+ at the knees and ankles and equal and symmetric.  Negative straight leg raise bilaterally tight hamstrings bilaterally.  He has subjective decreased sensation over the  dorsal aspect left foot and over the superficial peroneal region of the right foot.  He has tenderness over the lower lumbar spinal column no tenderness either paraspinous region area.  Specialty Comments:  No specialty comments available.  Imaging: Xr Hip Unilat W Or W/o Pelvis 1v Left  Result Date: 09/09/2018 AP pelvis lateral view left hip: Bilateral hips well located.  No acute fractures.  Left total hip arthroplasties well-seated.  Xr Lumb Spine Flex&ext Only  Result Date: 09/09/2018 Flexion extension L-spine: No instability at L5-S1 grade 1 anterior spondylolisthesis  Xr Lumbar Spine 2-3 Views  Result Date: 09/09/2018 Lumbar spine AP and lateral views: No acute fractures.  Anterior spondylolisthesis L4-5 on S1.  Degenerative disc disease at L5-S1.  Normal lordosis.    PMFS History: Patient Active Problem List   Diagnosis Date Noted  . Bilateral hand numbness 09/09/2018  . Plantar fasciitis of left foot 09/09/2018  . Status post total replacement of left hip 12/26/2017  . Morbid (severe) obesity due to excess calories (HCC) 11/27/2017  . Unilateral primary osteoarthritis, left knee 11/12/2017  . Unilateral primary osteoarthritis, left hip 11/12/2017  . Hypogonadism, male 05/08/2016   Past Medical History:  Diagnosis Date  . Asthma   . Current every day smoker   . Depression   . Diabetes mellitus, type II (HCC)   . Hyperlipidemia   . Hypertension   . Morbid obesity (HCC)   . Sedentary lifestyle     Family History  Problem Relation Age of Onset  . Diabetes Mother   . Hypertension Mother   . Heart attack Mother   . Osteoporosis Mother   . Diabetes Father   . Hypertension Father   . Heart attack Father   . Diabetes Sister   . Hypertension Sister   . Diabetes Brother   . Hypertension Brother     Past Surgical History:  Procedure Laterality Date  . OTHER SURGICAL HISTORY     R & L shoulder  . OTHER SURGICAL HISTORY     Knee surgery  . TOTAL HIP  ARTHROPLASTY Left 12/26/2017   Procedure: LEFT TOTAL HIP ARTHROPLASTY ANTERIOR APPROACH;  Surgeon: Kathryne HitchBlackman, Christopher Y, MD;  Location: [facility];  Service: Orthopedics;  Laterality: Left;   Social History   Occupational History  . Not on file  Tobacco Use  . Smoking status: Former Smoker    Packs/day: 1.50    Years: 25.00    Pack years: 37.50    Quit date: 12/15/2017    Years  since quitting: 0.7  . Smokeless tobacco: Never Used  Substance and Sexual Activity  . Alcohol use: No  . Drug use: Yes    Types: Marijuana  . Sexual activity: Not on file

## 2018-09-10 ENCOUNTER — Other Ambulatory Visit: Payer: Self-pay

## 2018-09-10 DIAGNOSIS — M79641 Pain in right hand: Secondary | ICD-10-CM

## 2018-09-10 DIAGNOSIS — M25552 Pain in left hip: Secondary | ICD-10-CM

## 2018-09-30 ENCOUNTER — Encounter: Payer: Self-pay | Admitting: Physical Medicine and Rehabilitation

## 2018-09-30 ENCOUNTER — Telehealth: Payer: Self-pay | Admitting: Orthopaedic Surgery

## 2018-09-30 ENCOUNTER — Ambulatory Visit (INDEPENDENT_AMBULATORY_CARE_PROVIDER_SITE_OTHER): Payer: Medicare PPO | Admitting: Physical Medicine and Rehabilitation

## 2018-09-30 ENCOUNTER — Other Ambulatory Visit: Payer: Self-pay

## 2018-09-30 DIAGNOSIS — R202 Paresthesia of skin: Secondary | ICD-10-CM

## 2018-09-30 NOTE — Telephone Encounter (Signed)
Returned call to patient left message to return call to reschedule appointment

## 2018-09-30 NOTE — Progress Notes (Signed)
  Numeric Pain Rating Scale and Functional Assessment Average Pain 8   In the last MONTH (on 0-10 scale) has pain interfered with the following?  1. General activity like being  able to carry out your everyday physical activities such as walking, climbing stairs, carrying groceries, or moving a chair?  Rating(7)     

## 2018-10-01 ENCOUNTER — Ambulatory Visit (HOSPITAL_COMMUNITY): Payer: Medicare PPO | Attending: Orthopaedic Surgery

## 2018-10-01 ENCOUNTER — Encounter (HOSPITAL_COMMUNITY): Payer: Self-pay

## 2018-10-01 DIAGNOSIS — M6281 Muscle weakness (generalized): Secondary | ICD-10-CM | POA: Insufficient documentation

## 2018-10-01 DIAGNOSIS — M25552 Pain in left hip: Secondary | ICD-10-CM | POA: Insufficient documentation

## 2018-10-01 DIAGNOSIS — R29898 Other symptoms and signs involving the musculoskeletal system: Secondary | ICD-10-CM

## 2018-10-01 NOTE — Therapy (Signed)
[facility] [address] [place], [place], [address] Phone: [phone]   Fax:  [phone]  Physical Therapy Evaluation  Patient Details  Name: Frank [name]. MRN: [id number] Date of Birth: [birth year] Referring Provider (PT): Jean Rosenthal, MD   Encounter Date: 10/01/2018  PT End of Session - 10/01/18 1502    Visit Number  1    Number of Visits  8    Date for PT Re-Evaluation  10/29/18    Authorization Type  [employer]    Authorization Time Period  10/01/18 to 10/29/18    Authorization - Visit Number  1    Authorization - Number of Visits  10    PT Start Time  9678    PT Stop Time  1400    PT Time Calculation (min)  45 min    Activity Tolerance  Patient tolerated treatment well    Behavior During Therapy  [facility] for tasks assessed/performed       Past Medical History:  Diagnosis Date  . Asthma   . Current every day smoker   . Depression   . Diabetes mellitus, type II ([place])   . Hyperlipidemia   . Hypertension   . Morbid obesity ([place])   . Sedentary lifestyle     Past Surgical History:  Procedure Laterality Date  . OTHER SURGICAL HISTORY     R & L shoulder  . OTHER SURGICAL HISTORY     Knee surgery  . TOTAL HIP ARTHROPLASTY Left 12/26/2017   Procedure: LEFT TOTAL HIP ARTHROPLASTY ANTERIOR APPROACH;  Surgeon: Mcarthur Rossetti, MD;  Location: [facility];  Service: Orthopedics;  Laterality: Left;    There were no vitals filed for this visit.   Subjective Assessment - 10/01/18 1311    Subjective  Pt reports L THA went well, but doctor reported he would need bil knee replacements soon after. Pt reports fell around 08/19/18 due to L knee giving out straight on his butt and within 3 days he couldn't put weight on LLE. Pt reports imaging after fall was negative for messing up THA or fractures. Pt reports tingling/needle sensation down LLE all the way to his toes occasionally. Pt reports L big toe gets numb and is  hypersensitive. Pt denies loss of b/b. Pt reports L knee feels weak and gives out at least once a week. Pt reports after sitting for prolonged periods, BLE go numb, then feel weak when he tries to stand up. Pt reports most of pain is at top of buttcrack. Pt reports L medial thigh is numb all the time and since the surgery. Pt reports having RW and WC, but does not use. Pt reports using SPC when L hip feels sore. Pt assists MIL with cooking, cleaning, med management, occasional assists her physically. Pt reports has seizures, but normally they are small and he just stares straight ahead and body tenses up.    Pertinent History  L5-S1 grade 1 anterior (x-ray impression)    Limitations  Standing;Walking    How long can you sit comfortably?  no issues    How long can you stand comfortably?  10 minutes    How long can you walk comfortably?  100 yards    Diagnostic tests  x-ray    Patient Stated Goals  be able to do stuff without pain    Currently in Pain?  Yes    Pain Score  6     Pain Location  Sacrum  top of buttcrack   Pain Descriptors / Indicators  Throbbing    Pain Type  Acute pain    Pain Onset  More than a month ago    Pain Frequency  Constant   will decreased to 2/10 pain   Aggravating Factors   standing, walking    Pain Relieving Factors  Pain medication, hot bath    Effect of Pain on Daily Activities  increased         [facility] PT Assessment - 10/01/18 0001      Assessment   Medical Diagnosis  L hip pain    Referring Provider (PT)  Doneen Poissonhristopher Blackman, MD    Onset Date/Surgical Date  12/26/17   L THA   Next MD Visit  not scheduled    Prior Therapy  Yes, post L THA; not for current issue      Precautions   Precautions  None      Restrictions   Weight Bearing Restrictions  No      Balance Screen   Has the patient fallen in the past 6 months  Yes    How many times?  1   walking across yard, L knee gave out   Has the patient had a decrease in activity level because of a  fear of falling?   No    Is the patient reluctant to leave their home because of a fear of falling?   No      Prior Function   Level of Independence  Independent    Vocation  Retired;On disability    Designer, multimediaVocation Requirements  Volunteer firefighter    Leisure  Gardening, can veggies, assist MIL, vacation      Cognition   Overall Cognitive Status  Within Functional Limits for tasks assessed      Observation/Other Assessments   Focus on Therapeutic Outcomes (FOTO)   to be completed next visit    Other Surveys   Oswestry Disability Index    Oswestry Disability Index   60% Disabled      Sensation   Light Touch  Impaired Detail   L medial thigh numb     Functional Tests   Functional tests  Sit to Stand      Sit to Stand   Comments  30 sec STS: 9 reps, no UE, from chair      Posture/Postural Control   Posture/Postural Control  Postural limitations    Postural Limitations  Rounded Shoulders;Forward head;Increased lumbar lordosis      ROM / Strength   AROM / PROM / Strength  Strength      Strength   Strength Assessment Site  Hip;Knee;Ankle    Right Hip Flexion  5/5    Right Hip Extension  4+/5    Right Hip ABduction  4/5    Left Hip Flexion  4+/5    Left Hip Extension  3+/5    Left Hip ABduction  4/5    Right Knee Flexion  5/5    Right Knee Extension  5/5    Left Knee Flexion  5/5    Left Knee Extension  4+/5    Right Ankle Dorsiflexion  5/5    Left Ankle Dorsiflexion  5/5      Palpation   SI assessment   SIJ tender to palpation, L>R    Palpation comment  tenderness throughout L lumbar paraspinals, L gluteal muscualture with pain at palpation site and no radiating symptoms      Special Tests  Special Tests  Sacrolliac Tests    Sacroiliac Tests   Sacral Compression      Pelvic Dictraction   Findings  Negative      Pelvic Compression   Findings  Positive    comment  sidelying      Sacral thrust    Findings  Positive    Side  Left      Gaenslen's test    Findings  Positive    Side   Left      Sacral Compression   Findings  Positive    Side   Left    Comments  Scour      Balance   Balance Assessed  Yes      Static Standing Balance   Static Standing - Balance Support  No upper extremity supported    Static Standing Balance -  Activities   Single Leg Stance - Right Leg;Single Leg Stance - Left Leg    Static Standing - Comment/# of Minutes  R: 2.5 sec or <, L: 2 sec or <        Objective measurements completed on examination: See above findings.     PT Education - 10/01/18 1324    Education Details  Assessment findings, POC, established HEP    Person(s) Educated  Patient    Methods  Explanation;Handout    Comprehension  Verbalized understanding       PT Short Term Goals - 10/01/18 1503      PT SHORT TERM GOAL #1   Title  Pt will improve strength by  grade to maximize gait pattern, improve balance and return to PLOF.    Time  2    Period  Weeks    Status  New    Target Date  10/15/18      PT SHORT TERM GOAL #2   Title  Pt will report 5/10 pain in LLE at worst with activities to improve ability to perform gardening, vegetable canning, and other activities.    Time  2    Period  Weeks    Status  New      PT SHORT TERM GOAL #3   Title  Pt will improve ODI score to 40 to indicate improved self perceived limitations and ability to perform functional tasks.    Time  2    Period  Weeks    Status  New        PT Long Term Goals - 10/01/18 1504      PT LONG TERM GOAL #1   Title  Pt will have 0/5 SIJ pain provocation tests positive to improve ability to perform functional tasks.    Time  4    Period  Weeks    Status  New    Target Date  10/29/18      PT LONG TERM GOAL #2   Title  Pt will report being able to stand for 30 minutes to complete leisure activities with 0/10 pain and no seated rest breaks.    Time  4    Period  Weeks    Status  New      PT LONG TERM GOAL #3   Title  Pt will improve SLS stance to 30  sec bil to reduce risk for falls.    Time  4    Period  Weeks    Status  New      PT LONG TERM GOAL #4   Title  Pt will improve ODI score  to 20 to indicate improved self perceived limitations and ability to perform functional tasks.    Time  4    Period  Weeks    Status  New             Plan - 10/01/18 1510    Clinical Impression Statement  Pt is a pleasant 53YO male with complaints of L hip pain, pain at SI joint (L>R), and difficulty performing standing activities after sustaining fall from standing on 08/19/18. Pt with 4/5 positive SIJ pain provocation tests and x-rays negative for bone fractures or malfunction of L THA leading therapist to hypothesize SIJ dysfunction is causing pt's pain and difficulty with mobility. Pt with deficits in strength throughout LLE compared to RLE. Pt with impaired sensation reporting occasional tingling down to L foot and numbness throughout L medial thigh, palpation does not change either subjective reports. Pt with self perceived limitations of 60% AEB ODI score this date. Pt with balance deficits, unable to maintain SLS for greater than 2.5 sec on either extremity. Pt also with deficits in transfers, performing 9 in 30 sec without UE use and reporting weakness in BLE with performance. Pt would benefit from skilled PT interventions to improve strength, activity tolerance, reduce pain and improve overall ability to perform functional tasks to improve QoL and return to PLOF.    Personal Factors and Comorbidities  Age;Comorbidity 3+;Past/Current Experience;Time since onset of injury/illness/exacerbation    Comorbidities  seizure activity    Examination-Activity Limitations  Stand;Transfers;Locomotion Level    Examination-Participation Restrictions  Community Activity;Volunteer    Clinical Decision Making  Low    Rehab Potential  Good    PT Frequency  2x / week    PT Duration  4 weeks    PT Treatment/Interventions  ADLs/Self Care Home Management;Aquatic  Therapy;Biofeedback;Cryotherapy;Moist Heat;DME Instruction;Gait training;Stair training;Functional mobility training;Therapeutic activities;Therapeutic exercise;Balance training;Neuromuscular re-education;Patient/family education;Orthotic Fit/Training;Manual techniques;Passive range of motion;Dry needling;Taping;Joint Manipulations    PT Next Visit Plan  Review goals, administer FOTO, perform 3MWT. Initiate core and hip strengthening for SIJ pain. Stretching for thoracic mobility and hip muscles. STM and manual techniques for pain PRN    PT Home Exercise Plan  Eval: bridging, seated HS stretch    Consulted and Agree with Plan of Care  Patient       Patient will benefit from skilled therapeutic intervention in order to improve the following deficits and impairments:  Decreased activity tolerance, Decreased balance, Decreased endurance, Decreased strength, Increased fascial restricitons, Increased muscle spasms, Impaired perceived functional ability, Impaired flexibility, Impaired sensation, Improper body mechanics, Obesity, Pain  Visit Diagnosis: 1. Pain in left hip   2. Muscle weakness (generalized)   3. Other symptoms and signs involving the musculoskeletal system        Problem List Patient Active Problem List   Diagnosis Date Noted  . Bilateral hand numbness 09/09/2018  . Plantar fasciitis of left foot 09/09/2018  . Status post total replacement of left hip 12/26/2017  . Morbid (severe) obesity due to excess calories ([facility]) 11/27/2017  . Unilateral primary osteoarthritis, left knee 11/12/2017  . Unilateral primary osteoarthritis, left hip 11/12/2017  . Hypogonadism, male 05/08/2016     Domenick Bookbinderori Kandance Yano PT, DPT  [facility] [address] [place], [place], [address] Phone: [phone]   Fax:  [phone]  Name: Frank Signsddie Lee Tiegs Jr. MRN: [id number] Date of Birth: Sep 30, 1965

## 2018-10-02 NOTE — Procedures (Signed)
EMG & NCV Findings: Evaluation of the left median motor nerve showed prolonged distal onset latency (4.4 ms), reduced amplitude (4.2 mV), and decreased conduction velocity (Elbow-Wrist, 45 m/s).  The left ulnar motor nerve showed decreased conduction velocity (B Elbow-Wrist, 49 m/s) and decreased conduction velocity (A Elbow-B Elbow, 52 m/s).  The left median (across palm) sensory and the right median (across palm) sensory nerves showed no response (Palm), prolonged distal peak latency (L4.9, R4.8 ms), and reduced amplitude (L7.0, R8.8 V).  All remaining nerves (as indicated in the following tables) were within normal limits.  All left vs. right side differences were within normal limits.    All examined muscles (as indicated in the following table) showed no evidence of electrical instability.    Impression: The above electrodiagnostic study is ABNORMAL and reveals evidence of:  1. A severe left median nerve entrapment at the wrist (carpal tunnel syndrome) affecting sensory and motor components.   2. A mild to moderate right median nerve entrapment at the wrist (carpal tunnel syndrome) affecting sensory components.  There is no significant electrodiagnostic evidence of any other focal nerve entrapment, brachial plexopathy or cervical radiculopathy.  There was no overt sign of underlying peripheral polyneuropathy although diagnostically a 3 mg study would need to be performed.  Recommendations: 1.  Follow-up with referring physician. 2.  Continue current management of symptoms. 3.  Continue use of resting splint at night-time and as needed during the day. 4.  Suggest surgical evaluation.  ___________________________ Naaman PlummerFred Rainie Crenshaw FAAPMR Board Certified, American Board of Physical Medicine and Rehabilitation    Nerve Conduction Studies Anti Sensory Summary Table   Stim Site NR Peak (ms) Norm Peak (ms) P-T Amp (V) Norm P-T Amp Site1 Site2 Delta-P (ms) Dist (cm) Vel (m/s) Norm Vel (m/s)   Left Median Acr Palm Anti Sensory (2nd Digit)  32.8C  Wrist    *4.9 <3.6 *7.0 >10 Wrist Palm  0.0    Palm *NR  <2.0          Right Median Acr Palm Anti Sensory (2nd Digit)  33.8C  Wrist    *4.8 <3.6 *8.8 >10 Wrist Palm  0.0    Palm *NR  <2.0          Left Radial Anti Sensory (Base 1st Digit)  32.7C  Wrist    2.3 <3.1 13.9  Wrist Base 1st Digit 2.3 0.0    Right Radial Anti Sensory (Base 1st Digit)  33.9C  Wrist    2.3 <3.1 11.3  Wrist Base 1st Digit 2.3 0.0    Left Ulnar Anti Sensory (5th Digit)  33C  Wrist    3.6 <3.7 15.9 >15.0 Wrist 5th Digit 3.6 14.0 39 >38  Right Ulnar Anti Sensory (5th Digit)  34.2C  Wrist    3.7 <3.7 23.3 >15.0 Wrist 5th Digit 3.7 14.0 38 >38   Motor Summary Table   Stim Site NR Onset (ms) Norm Onset (ms) O-P Amp (mV) Norm O-P Amp Site1 Site2 Delta-0 (ms) Dist (cm) Vel (m/s) Norm Vel (m/s)  Left Median Motor (Abd Poll Brev)  33C  Wrist    *4.4 <4.2 *4.2 >5 Elbow Wrist 5.1 23.0 *45 >50  Elbow    9.5  4.4         Right Median Motor (Abd Poll Brev)  33.5C  Wrist    4.2 <4.2 5.2 >5 Elbow Wrist 4.8 24.0 50 >50  Elbow    9.0  3.6         Left  Ulnar Motor (Abd Dig Min)  33.2C  Wrist    3.1 <4.2 11.1 >3 B Elbow Wrist 4.3 21.0 *49 >53  B Elbow    7.4  6.5  A Elbow B Elbow 2.1 11.0 *52 >53  A Elbow    9.5  7.6         Right Ulnar Motor (Abd Dig Min)  33.1C  Wrist    3.2 <4.2 11.8 >3 B Elbow Wrist 3.8 22.0 58 >53  B Elbow    7.0  10.1  A Elbow B Elbow 1.6 11.0 69 >53  A Elbow    8.6  9.4          EMG   Side Muscle Nerve Root Ins Act Fibs Psw Amp Dur Poly Recrt Int Fraser Din Comment  Left Abd Poll Brev Median C8-T1 Nml Nml Nml Nml Nml 0 Nml Nml   Left 1stDorInt Ulnar C8-T1 Nml Nml Nml Nml Nml 0 Nml Nml   Left PronatorTeres Median C6-7 Nml Nml Nml Nml Nml 0 Nml Nml   Left Biceps Musculocut C5-6 Nml Nml Nml Nml Nml 0 Nml Nml   Left Deltoid Axillary C5-6 Nml Nml Nml Nml Nml 0 Nml Nml     Nerve Conduction Studies Anti Sensory Left/Right Comparison   Stim Site  L Lat (ms) R Lat (ms) L-R Lat (ms) L Amp (V) R Amp (V) L-R Amp (%) Site1 Site2 L Vel (m/s) R Vel (m/s) L-R Vel (m/s)  Median Acr Palm Anti Sensory (2nd Digit)  32.8C  Wrist *4.9 *4.8 0.1 *7.0 *8.8 20.5 Wrist Palm     Palm             Radial Anti Sensory (Base 1st Digit)  32.7C  Wrist 2.3 2.3 0.0 13.9 11.3 18.7 Wrist Base 1st Digit     Ulnar Anti Sensory (5th Digit)  33C  Wrist 3.6 3.7 0.1 15.9 23.3 31.8 Wrist 5th Digit 39 38 1   Motor Left/Right Comparison   Stim Site L Lat (ms) R Lat (ms) L-R Lat (ms) L Amp (mV) R Amp (mV) L-R Amp (%) Site1 Site2 L Vel (m/s) R Vel (m/s) L-R Vel (m/s)  Median Motor (Abd Poll Brev)  33C  Wrist *4.4 4.2 0.2 *4.2 5.2 19.2 Elbow Wrist *45 50 5  Elbow 9.5 9.0 0.5 4.4 3.6 18.2       Ulnar Motor (Abd Dig Min)  33.2C  Wrist 3.1 3.2 0.1 11.1 11.8 5.9 B Elbow Wrist *49 58 9  B Elbow 7.4 7.0 0.4 6.5 10.1 35.6 A Elbow B Elbow *52 69 17  A Elbow 9.5 8.6 0.9 7.6 9.4 19.1          Waveforms:

## 2018-10-02 NOTE — Progress Notes (Signed)
Frank Anibal HendersonLee Balducci Jr. - 53 y.o. male MRN [id number]  Date of birth: November 22, 1965  Office Visit Note: Visit Date: 09/30/2018 PCP: Arlina RobesWinfield, Albert Carl, MD Referred by: Arlina RobesWinfield, Albert Carl, *  Subjective: Chief Complaint  Patient presents with  . Right Hand - Numbness  . Left Hand - Numbness   HPI:  Frank Signsddie Lee Walkup Jr. is a 53 y.o. male who comes in today At the request of Rexene EdisonGil Clark, P.A.-C for electrodiagnostic studies of both upper limbs.  Patient is right-hand dominant reports chronic worsening severe at times numbness and tingling in both hands particularly at night and while driving.  He gets a lot of pain first thing in the morning.  He reports his left hand feels worse than his right hand.  It is predominantly the radial 3 digits that are affected.  He denies any frank radicular pain.  He is a diabetic non-insulin-dependent with last hemoglobin A1c of 6.6.  His course is complicated by morbid obesity.  He has had a left total hip arthroplasty and now experiencing some pain and tingling in both legs as well.  No prior electrodiagnostic study.  ROS Otherwise per HPI.  Assessment & Plan: Visit Diagnoses:  1. Paresthesia of skin     Plan: Impression: The above electrodiagnostic study is ABNORMAL and reveals evidence of:  1. A severe left median nerve entrapment at the wrist (carpal tunnel syndrome) affecting sensory and motor components.   2. A mild to moderate right median nerve entrapment at the wrist (carpal tunnel syndrome) affecting sensory components.  There is no significant electrodiagnostic evidence of any other focal nerve entrapment, brachial plexopathy or cervical radiculopathy.  There was no overt sign of underlying peripheral polyneuropathy although diagnostically a 3 mg study would need to be performed.  Recommendations: 1.  Follow-up with referring physician. 2.  Continue current management of symptoms. 3.  Continue use of resting splint at night-time and as needed  during the day. 4.  Suggest surgical evaluation.  Meds & Orders: No orders of the defined types were placed in this encounter.   Orders Placed This Encounter  Procedures  . NCV with EMG (electromyography)    Follow-up: Return for Rexene EdisonGil Clark, P.A.-C.   Procedures: No procedures performed  EMG & NCV Findings: Evaluation of the left median motor nerve showed prolonged distal onset latency (4.4 ms), reduced amplitude (4.2 mV), and decreased conduction velocity (Elbow-Wrist, 45 m/s).  The left ulnar motor nerve showed decreased conduction velocity (B Elbow-Wrist, 49 m/s) and decreased conduction velocity (A Elbow-B Elbow, 52 m/s).  The left median (across palm) sensory and the right median (across palm) sensory nerves showed no response (Palm), prolonged distal peak latency (L4.9, R4.8 ms), and reduced amplitude (L7.0, R8.8 V).  All remaining nerves (as indicated in the following tables) were within normal limits.  All left vs. right side differences were within normal limits.    All examined muscles (as indicated in the following table) showed no evidence of electrical instability.    Impression: The above electrodiagnostic study is ABNORMAL and reveals evidence of:  1. A severe left median nerve entrapment at the wrist (carpal tunnel syndrome) affecting sensory and motor components.   2. A mild to moderate right median nerve entrapment at the wrist (carpal tunnel syndrome) affecting sensory components.  There is no significant electrodiagnostic evidence of any other focal nerve entrapment, brachial plexopathy or cervical radiculopathy.  There was no overt sign of underlying peripheral polyneuropathy although diagnostically a 3 mg  study would need to be performed.  Recommendations: 1.  Follow-up with referring physician. 2.  Continue current management of symptoms. 3.  Continue use of resting splint at night-time and as needed during the day. 4.  Suggest surgical evaluation.   ___________________________ Frank [name] FAAPMR Board Certified, American Board of Physical Medicine and Rehabilitation    Nerve Conduction Studies Anti Sensory Summary Table   Stim Site NR Peak (ms) Norm Peak (ms) P-T Amp (V) Norm P-T Amp Site1 Site2 Delta-P (ms) Dist (cm) Vel (m/s) Norm Vel (m/s)  Left Median Acr Palm Anti Sensory (2nd Digit)  32.8C  Wrist    *4.9 <3.6 *7.0 >10 Wrist Palm  0.0    Palm *NR  <2.0          Right Median Acr Palm Anti Sensory (2nd Digit)  33.8C  Wrist    *4.8 <3.6 *8.8 >10 Wrist Palm  0.0    Palm *NR  <2.0          Left Radial Anti Sensory (Base 1st Digit)  32.7C  Wrist    2.3 <3.1 13.9  Wrist Base 1st Digit 2.3 0.0    Right Radial Anti Sensory (Base 1st Digit)  33.9C  Wrist    2.3 <3.1 11.3  Wrist Base 1st Digit 2.3 0.0    Left Ulnar Anti Sensory (5th Digit)  33C  Wrist    3.6 <3.7 15.9 >15.0 Wrist 5th Digit 3.6 14.0 39 >38  Right Ulnar Anti Sensory (5th Digit)  34.2C  Wrist    3.7 <3.7 23.3 >15.0 Wrist 5th Digit 3.7 14.0 38 >38   Motor Summary Table   Stim Site NR Onset (ms) Norm Onset (ms) O-P Amp (mV) Norm O-P Amp Site1 Site2 Delta-0 (ms) Dist (cm) Vel (m/s) Norm Vel (m/s)  Left Median Motor (Abd Poll Brev)  33C  Wrist    *4.4 <4.2 *4.2 >5 Elbow Wrist 5.1 23.0 *45 >50  Elbow    9.5  4.4         Right Median Motor (Abd Poll Brev)  33.5C  Wrist    4.2 <4.2 5.2 >5 Elbow Wrist 4.8 24.0 50 >50  Elbow    9.0  3.6         Left Ulnar Motor (Abd Dig Min)  33.2C  Wrist    3.1 <4.2 11.1 >3 B Elbow Wrist 4.3 21.0 *49 >53  B Elbow    7.4  6.5  A Elbow B Elbow 2.1 11.0 *52 >53  A Elbow    9.5  7.6         Right Ulnar Motor (Abd Dig Min)  33.1C  Wrist    3.2 <4.2 11.8 >3 B Elbow Wrist 3.8 22.0 58 >53  B Elbow    7.0  10.1  A Elbow B Elbow 1.6 11.0 69 >53  A Elbow    8.6  9.4          EMG   Side Muscle Nerve Root Ins Act Fibs Psw Amp Dur Poly Recrt Int Dennie BiblePat Comment  Left Abd Poll Brev Median C8-T1 Nml Nml Nml Nml Nml 0 Nml Nml   Left  1stDorInt Ulnar C8-T1 Nml Nml Nml Nml Nml 0 Nml Nml   Left PronatorTeres Median C6-7 Nml Nml Nml Nml Nml 0 Nml Nml   Left Biceps Musculocut C5-6 Nml Nml Nml Nml Nml 0 Nml Nml   Left Deltoid Axillary C5-6 Nml Nml Nml Nml Nml 0 Nml Nml     Nerve  Conduction Studies Anti Sensory Left/Right Comparison   Stim Site L Lat (ms) R Lat (ms) L-R Lat (ms) L Amp (V) R Amp (V) L-R Amp (%) Site1 Site2 L Vel (m/s) R Vel (m/s) L-R Vel (m/s)  Median Acr Palm Anti Sensory (2nd Digit)  32.8C  Wrist *4.9 *4.8 0.1 *7.0 *8.8 20.5 Wrist Palm     Palm             Radial Anti Sensory (Base 1st Digit)  32.7C  Wrist 2.3 2.3 0.0 13.9 11.3 18.7 Wrist Base 1st Digit     Ulnar Anti Sensory (5th Digit)  33C  Wrist 3.6 3.7 0.1 15.9 23.3 31.8 Wrist 5th Digit 39 38 1   Motor Left/Right Comparison   Stim Site L Lat (ms) R Lat (ms) L-R Lat (ms) L Amp (mV) R Amp (mV) L-R Amp (%) Site1 Site2 L Vel (m/s) R Vel (m/s) L-R Vel (m/s)  Median Motor (Abd Poll Brev)  33C  Wrist *4.4 4.2 0.2 *4.2 5.2 19.2 Elbow Wrist *45 50 5  Elbow 9.5 9.0 0.5 4.4 3.6 18.2       Ulnar Motor (Abd Dig Min)  33.2C  Wrist 3.1 3.2 0.1 11.1 11.8 5.9 B Elbow Wrist *49 58 9  B Elbow 7.4 7.0 0.4 6.5 10.1 35.6 A Elbow B Elbow *52 69 17  A Elbow 9.5 8.6 0.9 7.6 9.4 19.1          Waveforms:                     Clinical History: No specialty comments available.     Objective:  VS:  HT:    WT:   BMI:     BP:   HR: bpm  TEMP: ( )  RESP:  Physical Exam Musculoskeletal:        General: No tenderness.     Comments: Inspection reveals some flattening of the left APB but no atrophy of the bilateral APB or FDI or hand intrinsics. There is no swelling, color changes, allodynia or dystrophic changes. There is 5 out of 5 strength in the bilateral wrist extension, finger abduction and long finger flexion.  Impaired sensation to light touch in median nerve distribution on the left.. There is a negative Froment's test bilaterally. There is a  negative Hoffmann's test bilaterally.  Skin:    General: Skin is warm and dry.     Findings: No erythema or rash.  Neurological:     General: No focal deficit present.     Mental Status: He is alert and oriented to person, place, and time.     Sensory: No sensory deficit.     Motor: No weakness or abnormal muscle tone.     Coordination: Coordination normal.     Gait: Gait normal.  Psychiatric:        Mood and Affect: Mood normal.        Behavior: Behavior normal.        Thought Content: Thought content normal.     Ortho Exam Imaging: No results found.

## 2018-10-06 ENCOUNTER — Ambulatory Visit: Payer: Medicare PPO | Admitting: Orthopaedic Surgery

## 2018-10-07 ENCOUNTER — Ambulatory Visit: Payer: Medicare PPO | Admitting: Orthopaedic Surgery

## 2018-10-14 ENCOUNTER — Other Ambulatory Visit: Payer: Self-pay | Admitting: Physician Assistant

## 2018-10-14 NOTE — Telephone Encounter (Signed)
Please advise 

## 2018-10-15 ENCOUNTER — Other Ambulatory Visit: Payer: Self-pay

## 2018-10-15 ENCOUNTER — Ambulatory Visit (INDEPENDENT_AMBULATORY_CARE_PROVIDER_SITE_OTHER): Payer: Medicare PPO | Admitting: Orthopaedic Surgery

## 2018-10-15 ENCOUNTER — Encounter: Payer: Self-pay | Admitting: Orthopaedic Surgery

## 2018-10-15 DIAGNOSIS — G5603 Carpal tunnel syndrome, bilateral upper limbs: Secondary | ICD-10-CM | POA: Diagnosis not present

## 2018-10-15 DIAGNOSIS — G5601 Carpal tunnel syndrome, right upper limb: Secondary | ICD-10-CM

## 2018-10-15 DIAGNOSIS — G5602 Carpal tunnel syndrome, left upper limb: Secondary | ICD-10-CM

## 2018-10-15 NOTE — Progress Notes (Signed)
The patient is following up after bilateral upper extremity nerve conduction studies to assess for carpal tunnel syndrome.  He has significant numbness and tingling in both hands but is much worse on the left than the right.  He is right-hand dominant.  He is 10 months out from a left total hip arthroplasty and said that is done very well.  He is retired but he does work and use his hands on a daily basis.  The numbness does wake him up at night.  On examination he does have a positive Phalen's and Tinel's sign bilaterally.  There is no atrophy in either hand but he does have slightly weak grip and pinch strength on the left comparing the left and right.  The nerve conduction studies were reviewed with him and it does show severe carpal tunnel syndrome on the left side and mild to moderate carpal tunnel syndrome on the right side.  I explained to him what the studies means and explained to him about the anatomy of the wrist and the transverse carpal ligament.  We have recommended a left open carpal tunnel release.  I explained to him what the surgery involves as well as the risk and benefits of surgery.  I did give him our surgery scheduler's card and let him know that he should not wait 6 months to a year to have this done.  He feels like he will do it sooner but he needs to give [place] a call to let us know.  This can be done in outpatient surgical center under mask ventilation IV sedation as well as local.  All question concerns were answered addressed.  He will call us when he would like [place] to schedule him for this left open carpal tunnel release.

## 2018-10-19 ENCOUNTER — Telehealth (HOSPITAL_COMMUNITY): Payer: Self-pay

## 2018-10-19 NOTE — Telephone Encounter (Signed)
Pt said he could not afford 40.00 per visit, he wants to be d/c and  he is having hand surgery on the 8/17 also

## 2018-10-20 ENCOUNTER — Ambulatory Visit (HOSPITAL_COMMUNITY): Payer: Medicare PPO

## 2018-10-20 ENCOUNTER — Encounter (HOSPITAL_COMMUNITY): Payer: Self-pay

## 2018-10-20 NOTE — Therapy (Signed)
[place], [place], [address] Phone: [phone]   Fax:  [phone]  Patient Details  Name: Frank [name]. MRN: [id number] Date of Birth: 02/23/1966 Referring Provider:  No ref. provider found  Encounter Date: 10/20/2018   PHYSICAL THERAPY DISCHARGE SUMMARY  Visits from Start of Care: 1  Current functional level related to goals / functional outcomes: See eval note   Remaining deficits: See eval note   Education / Equipment: See eval note  Plan: Patient agrees to discharge.  Patient goals were not met. Patient is being discharged due to the patient's request.  ?????     Per front office staff [name]: Pt said he could not afford 40.00 per visit, he wants to be d/c and he is having hand surgery on the 8/17 also. Pt has not returned to physical therapy since initial evaluation on 10/01/18. Will discharge pt today per pt's request.   Talbot Grumbling PT, DPT 10/20/18, 8:00 AM [place], [place], [address] Phone: [phone]   Fax:  [phone]

## 2018-10-22 ENCOUNTER — Ambulatory Visit (HOSPITAL_COMMUNITY): Payer: Medicare PPO

## 2018-10-27 ENCOUNTER — Ambulatory Visit (HOSPITAL_COMMUNITY): Payer: Medicare PPO

## 2018-10-29 ENCOUNTER — Ambulatory Visit (HOSPITAL_COMMUNITY): Payer: Medicare PPO

## 2018-11-03 ENCOUNTER — Encounter (HOSPITAL_COMMUNITY): Payer: Medicare PPO

## 2018-11-05 ENCOUNTER — Encounter (HOSPITAL_COMMUNITY): Payer: Medicare PPO

## 2018-11-10 ENCOUNTER — Encounter (HOSPITAL_COMMUNITY): Payer: Medicare PPO

## 2018-11-12 ENCOUNTER — Encounter (HOSPITAL_COMMUNITY): Payer: Medicare PPO

## 2018-11-21 ENCOUNTER — Other Ambulatory Visit: Payer: Self-pay | Admitting: Physician Assistant

## 2018-11-24 NOTE — Telephone Encounter (Signed)
Please advise 

## 2018-11-26 ENCOUNTER — Encounter: Payer: Self-pay | Admitting: Orthopaedic Surgery

## 2018-11-26 ENCOUNTER — Telehealth: Payer: Self-pay

## 2018-11-26 ENCOUNTER — Other Ambulatory Visit: Payer: Self-pay | Admitting: Orthopaedic Surgery

## 2018-11-26 ENCOUNTER — Telehealth: Payer: Self-pay | Admitting: Orthopaedic Surgery

## 2018-11-26 DIAGNOSIS — G5602 Carpal tunnel syndrome, left upper limb: Secondary | ICD-10-CM | POA: Diagnosis not present

## 2018-11-26 MED ORDER — HYDROCODONE-ACETAMINOPHEN 5-325 MG PO TABS: 1.0000 | ORAL_TABLET | Freq: Four times a day (QID) | ORAL | 0 refills | Status: DC | PRN

## 2018-11-26 NOTE — Telephone Encounter (Signed)
Spoke with patient scheduled his 2 wk post op appointment. I also read the note from Dr Ninfa Linden to both patient and his wife concerning the Ace wrap. The number if needed to contact patient is [phone]

## 2018-11-26 NOTE — Telephone Encounter (Signed)
That can be common after surgery like that.  They can actually unwrap his Ace wrap completely if they would like and then rewrap it more loosely.  He should then ice the palm of the hand as well as elevate above the heart level and that should improve.

## 2018-11-26 NOTE — Telephone Encounter (Signed)
Please advise 

## 2018-11-26 NOTE — Telephone Encounter (Signed)
Patient's wife Sharyn Lull called stating that patient had carpal tunnel surgery with Dr. Ninfa Linden this morning on the left wrist and now his left thumb, index finger, and middle finger are swollen and have a  pale look and has swelling below his knuckles on his left hand.  Cb# is [phone].  Please advise.  Thank you.

## 2018-12-09 ENCOUNTER — Ambulatory Visit: Payer: Medicare PPO | Admitting: Orthopaedic Surgery

## 2018-12-10 ENCOUNTER — Encounter: Payer: Self-pay | Admitting: Orthopaedic Surgery

## 2018-12-10 ENCOUNTER — Other Ambulatory Visit: Payer: Self-pay

## 2018-12-10 ENCOUNTER — Ambulatory Visit (INDEPENDENT_AMBULATORY_CARE_PROVIDER_SITE_OTHER): Payer: Medicare PPO | Admitting: Orthopaedic Surgery

## 2018-12-10 DIAGNOSIS — Z9889 Other specified postprocedural states: Secondary | ICD-10-CM

## 2018-12-10 NOTE — Progress Notes (Signed)
The patient is 2 weeks status post a left open carpal tunnel release.  He says he is doing very well and has no issues.  He says his numbness and tingling is gone.  On examination of his left hand he does have full motor function of the hand but there is still some weakness on grip strength.  He has full range of motion of the thumb and fingers.  His sensation is normal.  His hand is well-perfused.  The suture line looks good.  I removed the sutures in place Steri-Strips.  At this point he needs to increase his activities as comfort allows.  We will see him back in 6 weeks to see how is doing overall.  All question concerns were answered and addressed.

## 2018-12-23 ENCOUNTER — Other Ambulatory Visit: Payer: Self-pay | Admitting: Physician Assistant

## 2018-12-23 NOTE — Telephone Encounter (Signed)
Please advise 

## 2019-01-18 ENCOUNTER — Ambulatory Visit (INDEPENDENT_AMBULATORY_CARE_PROVIDER_SITE_OTHER): Payer: Medicare PPO

## 2019-01-18 ENCOUNTER — Other Ambulatory Visit: Payer: Self-pay

## 2019-01-18 ENCOUNTER — Ambulatory Visit (INDEPENDENT_AMBULATORY_CARE_PROVIDER_SITE_OTHER): Payer: Medicare PPO | Admitting: Physician Assistant

## 2019-01-18 ENCOUNTER — Encounter: Payer: Self-pay | Admitting: Physician Assistant

## 2019-01-18 DIAGNOSIS — M1711 Unilateral primary osteoarthritis, right knee: Secondary | ICD-10-CM

## 2019-01-18 DIAGNOSIS — M25561 Pain in right knee: Secondary | ICD-10-CM | POA: Diagnosis not present

## 2019-01-18 MED ORDER — METHYLPREDNISOLONE ACETATE 40 MG/ML IJ SUSP
40.0000 mg | INTRAMUSCULAR | Status: AC | PRN
  Administered 2019-01-18: 12:00:00 40 mg via INTRA_ARTICULAR

## 2019-01-18 MED ORDER — LIDOCAINE HCL 1 % IJ SOLN
3.0000 mL | INTRAMUSCULAR | Status: AC | PRN
  Administered 2019-01-18: 12:00:00 3 mL

## 2019-01-18 NOTE — Progress Notes (Signed)
Office Visit Note   Patient: Frank [name].           Date of Birth: 04-25-1965           MRN: [id number] Visit Date: 01/18/2019              Requested by: Arlina Robes, MD [address] [place],  [place] [address] PCP: Arlina Robes, MD   Assessment & Plan: Visit Diagnoses:  1. Acute pain of right knee   2. Primary osteoarthritis of right knee     Plan:  We will watch his glucose levels in the next few days as he needs to schedule his glucose levels.  Ace bandage was applied to the knee today, he will remove the Ace bandage this evening.  Follow-up with [place] in 2 weeks to see what type of response he had to the injection.  Questions encouraged and answered at length.  Follow-Up Instructions: Return in about 2 weeks (around 02/01/2019).   Orders:  Orders Placed This Encounter  Procedures  . Large Joint Inj  . XR Knee 1-2 Views Right   No orders of the defined types were placed in this encounter.     Procedures: Large Joint Inj on 01/18/2019 11:31 AM Indications: pain Details: 22 G 1.5 in needle, superolateral approach  Arthrogram: No  Medications: 3 mL lidocaine 1 %; 40 mg methylPREDNISolone acetate 40 MG/ML Aspirate: 70 mL bloody Outcome: tolerated well, no immediate complications Procedure, treatment alternatives, risks and benefits explained, specific risks discussed. Consent was given by the patient. Immediately prior to procedure a time out was called to verify the correct patient, procedure, equipment, support staff and site/side marked as required. Patient was prepped and draped in the usual sterile fashion.       Clinical Data: No additional findings.   Subjective: Chief Complaint  Patient presents with  . Right Knee - Pain    HPI Frank [name] is well-known to Dr. Magnus Ivan service comes in today due to right knee pain.  He states this past Saturday he twisted his right knee.  He is unable to bear weight on it using a cane to ambulate  otherwise using a wheelchair.  He has his pain is under the patella area.  Notes knee difficult to bend and is tight.  Review of Systems Negative for fevers or chills.  Objective: Vital Signs: There were no vitals taken for this visit.  Physical Exam Constitutional:      Appearance: He is obese. He is not ill-appearing or diaphoretic.  Pulmonary:     Effort: Pulmonary effort is normal.  Neurological:     Mental Status: He is alert and oriented to person, place, and time.  Psychiatric:        Mood and Affect: Mood normal.        Behavior: Behavior normal.     Ortho Exam Left knee full extension flexion at 90 degrees and attempts of beyond 90 degrees causes significant pain.  He has tenderness along the medial joint line.  No instability valgus varus stressing.  Positive effusion no abnormal warmth erythema. Specialty Comments:  No specialty comments available.  Imaging: Xr Knee 1-2 Views Right  Result Date: 01/18/2019 Right knee 2 views: No acute fracture.  Moderately severe narrowing medial joint line.  Severe patellofemoral changes.  Periarticular spurring of the lateral joint line.  No bony abnormalities otherwise.    PMFS History: Patient Active Problem List   Diagnosis Date Noted  .  Carpal tunnel syndrome, left upper limb 10/15/2018  . Carpal tunnel syndrome, right upper limb 10/15/2018  . Bilateral hand numbness 09/09/2018  . Plantar fasciitis of left foot 09/09/2018  . Status post total replacement of left hip 12/26/2017  . Morbid (severe) obesity due to excess calories ([place]) 11/27/2017  . Unilateral primary osteoarthritis, left knee 11/12/2017  . Unilateral primary osteoarthritis, left hip 11/12/2017  . Hypogonadism, male 05/08/2016   Past Medical History:  Diagnosis Date  . Asthma   . Current every day smoker   . Depression   . Diabetes mellitus, type II ([place])   . Hyperlipidemia   . Hypertension   . Morbid obesity ([place])   . Sedentary lifestyle      Family History  Problem Relation Age of Onset  . Diabetes Mother   . Hypertension Mother   . Heart attack Mother   . Osteoporosis Mother   . Diabetes Father   . Hypertension Father   . Heart attack Father   . Diabetes Sister   . Hypertension Sister   . Diabetes Brother   . Hypertension Brother     Past Surgical History:  Procedure Laterality Date  . OTHER SURGICAL HISTORY     R & L shoulder  . OTHER SURGICAL HISTORY     Knee surgery  . TOTAL HIP ARTHROPLASTY Left 12/26/2017   Procedure: LEFT TOTAL HIP ARTHROPLASTY ANTERIOR APPROACH;  Surgeon: Mcarthur Rossetti, MD;  Location: [facility];  Service: Orthopedics;  Laterality: Left;   Social History   Occupational History  . Not on file  Tobacco Use  . Smoking status: Former Smoker    Packs/day: 1.50    Years: 25.00    Pack years: 37.50    Quit date: 12/15/2017    Years since quitting: 1.0  . Smokeless tobacco: Never Used  Substance and Sexual Activity  . Alcohol use: No  . Drug use: Yes    Types: Marijuana  . Sexual activity: Not on file

## 2019-01-20 ENCOUNTER — Ambulatory Visit: Payer: Medicare PPO | Admitting: Orthopaedic Surgery

## 2019-01-25 ENCOUNTER — Encounter: Payer: Self-pay | Admitting: Orthopaedic Surgery

## 2019-01-25 ENCOUNTER — Telehealth: Payer: Self-pay

## 2019-01-25 ENCOUNTER — Ambulatory Visit: Payer: Medicare PPO | Admitting: Orthopaedic Surgery

## 2019-01-25 DIAGNOSIS — M25561 Pain in right knee: Secondary | ICD-10-CM | POA: Diagnosis not present

## 2019-01-25 DIAGNOSIS — M1712 Unilateral primary osteoarthritis, left knee: Secondary | ICD-10-CM

## 2019-01-25 DIAGNOSIS — M170 Bilateral primary osteoarthritis of knee: Secondary | ICD-10-CM

## 2019-01-25 DIAGNOSIS — Z9889 Other specified postprocedural states: Secondary | ICD-10-CM

## 2019-01-25 DIAGNOSIS — M1711 Unilateral primary osteoarthritis, right knee: Secondary | ICD-10-CM | POA: Diagnosis not present

## 2019-01-25 NOTE — Telephone Encounter (Signed)
Submitted online [employer] for Monovisc bilateral, patient has [employer] ins.

## 2019-01-25 NOTE — Telephone Encounter (Signed)
Bilateral knee gel injections 

## 2019-01-25 NOTE — Progress Notes (Signed)
The patient is an follow-up for carpal tunnel surgery but is also being seen for his knees.  At his last visit he will was able to aspirate fluid of the right knee and placed a steroid injection in his right knee.  X-ray showed significant varus malalignment of both knees with complete loss of medial joint space and significant patellofemoral arthritic changes.  He said the injection has helped.  He wants to consider other treatment modalities for the osteoarthritis of his knees.  He does have a high body mass index.  We have replaced the hip before.  He says his carpal tunnel surgery is done well.  He does report pain at the incision site itself but no redness.  He says the numbness and tingling symptoms have almost completely resolved.  He did say that the steroid injection helped his right knee.  On examination of both knees there is patellofemoral crepitation and varus malalignment.  He has significant medial joint tenderness bilaterally.  Examination of his left hand shows a well-healed surgical incision with good grip and pinch strength.  He understands that his hand should continue improve with time.  From the standpoint of his knees though, I would like to order hyaluronic acid to see if this will help subside some of the osteoarthritis pain that he is experiencing with his knees.  I have encouraged weight loss as well.  We will see him back up in 4 weeks to place hyaluronic acid in both his knees to treat the pain from osteoarthritis.  At that visit I would like a weight and a calculated BMI for his chart.

## 2019-01-26 NOTE — Telephone Encounter (Signed)
Can you please call patient and tell him that his insurance covers all but 20% of costs of injection Monovisc bilateral knees.  He will owe approx $400- this is just an estimate, and we won't know until insurance is filed and responds.  He will be billed afterwards.  He will owe also a $45 copay on date of service.  If he wants to schedule, please do so.  [place] patient.  Thanks!  Buy and bill ok, no PA needed.

## 2019-02-01 ENCOUNTER — Ambulatory Visit: Payer: Medicare PPO | Admitting: Physician Assistant

## 2019-02-05 NOTE — Telephone Encounter (Signed)
IC patient to schedule his gel injection scheduled.  I spoke to the patient's wife.  She will give him the message and have him call the office on Monday to schedule his appointment.

## 2019-02-10 ENCOUNTER — Other Ambulatory Visit: Payer: Self-pay

## 2019-02-10 ENCOUNTER — Encounter: Payer: Self-pay | Admitting: Physician Assistant

## 2019-02-10 ENCOUNTER — Ambulatory Visit: Payer: Medicare PPO | Admitting: Physician Assistant

## 2019-02-10 DIAGNOSIS — M1711 Unilateral primary osteoarthritis, right knee: Secondary | ICD-10-CM

## 2019-02-10 NOTE — Progress Notes (Signed)
Office Visit Note   Patient: Frank [name].           Date of Birth: 01-Nov-1965           MRN: [id number] Visit Date: 02/10/2019              Requested by: Arlina Robes, MD [address] [place],  [place] [address] PCP: Arlina Robes, MD   Assessment & Plan: Visit Diagnoses: No diagnosis found.  Plan: Given the fact the patient was given a cortisone injection did not offer any type of injection today.  Talked with him about the significant arthritis he has in his knee.  However given his recent history of congestive heart failure and need to lose weight prior to surgery would not recommend any type of surgical intervention i.e. knee arthroplasty.  We will try a hinged knee brace for the time being and see if this gives him some instability of the knee.  Dr. Magnus Ivan also has ordered ironic acid to be placed in both knees.  Follow-Up Instructions: Return in about 6 weeks (around 03/24/2019).   Orders:  No orders of the defined types were placed in this encounter.  No orders of the defined types were placed in this encounter.     Procedures: No procedures performed   Clinical Data: No additional findings.   Subjective: Chief Complaint  Patient presents with  . Right Knee - Pain    HPI Frank [name] comes in today due to right knee pain.  States Friday he was getting up from the tub and had severe pop in the knee he has had severe pain in the knee since then.  He feels he is having swelling in the knee but he is using a cane wheelchair today get around.  He did not have a fall. Review of Systems See HPI  Objective: Vital Signs: There were no vitals taken for this visit.  Physical Exam Constitutional:      Appearance: He is not ill-appearing or diaphoretic.  Pulmonary:     Effort: Pulmonary effort is normal.  Neurological:     Mental Status: He is alert and oriented to person, place, and time.     Ortho Exam Right knee has tenderness along medial  joint line.  No instability valgus varus stressing.  No effusion abnormal warmth of the right knee.  Patellofemoral crepitus.  But overall good range of motion the knee. Specialty Comments:  No specialty comments available.  Imaging: No results found.   PMFS History: Patient Active Problem List   Diagnosis Date Noted  . Unilateral primary osteoarthritis, right knee 01/25/2019  . Carpal tunnel syndrome, left upper limb 10/15/2018  . Carpal tunnel syndrome, right upper limb 10/15/2018  . Bilateral hand numbness 09/09/2018  . Plantar fasciitis of left foot 09/09/2018  . Status post total replacement of left hip 12/26/2017  . Morbid (severe) obesity due to excess calories (HCC) 11/27/2017  . Unilateral primary osteoarthritis, left knee 11/12/2017  . Unilateral primary osteoarthritis, left hip 11/12/2017  . Hypogonadism, male 05/08/2016   Past Medical History:  Diagnosis Date  . Asthma   . Current every day smoker   . Depression   . Diabetes mellitus, type II (HCC)   . Hyperlipidemia   . Hypertension   . Morbid obesity (HCC)   . Sedentary lifestyle     Family History  Problem Relation Age of Onset  . Diabetes Mother   . Hypertension Mother   . Heart  attack Mother   . Osteoporosis Mother   . Diabetes Father   . Hypertension Father   . Heart attack Father   . Diabetes Sister   . Hypertension Sister   . Diabetes Brother   . Hypertension Brother     Past Surgical History:  Procedure Laterality Date  . OTHER SURGICAL HISTORY     R & L shoulder  . OTHER SURGICAL HISTORY     Knee surgery  . TOTAL HIP ARTHROPLASTY Left 12/26/2017   Procedure: LEFT TOTAL HIP ARTHROPLASTY ANTERIOR APPROACH;  Surgeon: Mcarthur Rossetti, MD;  Location: [facility];  Service: Orthopedics;  Laterality: Left;   Social History   Occupational History  . Not on file  Tobacco Use  . Smoking status: Former Smoker    Packs/day: 1.50    Years: 25.00    Pack years: 37.50    Quit date: 12/15/2017     Years since quitting: 1.1  . Smokeless tobacco: Never Used  Substance and Sexual Activity  . Alcohol use: No  . Drug use: Yes    Types: Marijuana  . Sexual activity: Not on file

## 2019-02-19 ENCOUNTER — Telehealth: Payer: Self-pay | Admitting: Orthopaedic Surgery

## 2019-02-19 NOTE — Telephone Encounter (Signed)
No he does not need to come in

## 2019-02-19 NOTE — Telephone Encounter (Signed)
Please advise 

## 2019-02-19 NOTE — Telephone Encounter (Signed)
Patient called stating that he was suppose to come back on Thursday the 10th to get a gel injection.  Patient stated that he is not able to afford the $400 that he would be responsible for out of pocket.  He is wanting to know if he still needs to come to the appointment or is there something else that can be done for his knees.  CB#[phone].  Thank you.

## 2019-02-22 NOTE — Telephone Encounter (Signed)
Spoke with patients wife, patient does not need to come in for appointment on the Thursday the 10th.

## 2019-02-25 ENCOUNTER — Ambulatory Visit: Payer: Medicare PPO | Admitting: Orthopaedic Surgery

## 2019-03-10 ENCOUNTER — Telehealth (HOSPITAL_COMMUNITY): Payer: Self-pay | Admitting: *Deleted

## 2019-03-10 NOTE — Telephone Encounter (Signed)
Called to see if patient is still interested in the program. They said they could not afford the co-pay. Told them about the virtual program and downloading the app. She said she would talk to her husband to see if he wanted it and would call us back on 03/11/19.  [employer]

## 2019-03-25 ENCOUNTER — Ambulatory Visit: Payer: Medicare PPO | Admitting: Physician Assistant

## 2019-04-29 ENCOUNTER — Telehealth (HOSPITAL_COMMUNITY): Payer: Self-pay

## 2019-04-29 NOTE — Telephone Encounter (Signed)
Called patient regarding his referral to Cardiac Rehab from [facility] for NSTEMI/Stent. He said he is not able to participate in the program due to co-payment. He tried getting help through social services but was denied.

## 2020-04-12 ENCOUNTER — Ambulatory Visit (INDEPENDENT_AMBULATORY_CARE_PROVIDER_SITE_OTHER): Payer: Medicare PPO

## 2020-04-12 ENCOUNTER — Encounter: Payer: Self-pay | Admitting: Orthopaedic Surgery

## 2020-04-12 ENCOUNTER — Ambulatory Visit (INDEPENDENT_AMBULATORY_CARE_PROVIDER_SITE_OTHER): Payer: Medicare PPO | Admitting: Orthopaedic Surgery

## 2020-04-12 VITALS — Ht 69.0 in | Wt 278.0 lb

## 2020-04-12 DIAGNOSIS — M25552 Pain in left hip: Secondary | ICD-10-CM

## 2020-04-12 DIAGNOSIS — M5442 Lumbago with sciatica, left side: Secondary | ICD-10-CM

## 2020-04-12 DIAGNOSIS — M25572 Pain in left ankle and joints of left foot: Secondary | ICD-10-CM

## 2020-04-12 MED ORDER — HYDROCODONE-ACETAMINOPHEN 7.5-325 MG PO TABS: 1.0000 | ORAL_TABLET | Freq: Four times a day (QID) | ORAL | 0 refills | Status: DC | PRN

## 2020-04-12 MED ORDER — TIZANIDINE HCL 4 MG PO TABS: 4.0000 mg | ORAL_TABLET | Freq: Three times a day (TID) | ORAL | 1 refills | Status: DC | PRN

## 2020-04-12 NOTE — Progress Notes (Signed)
Office Visit Note   Patient: Frank [name].           Date of Birth: 1966/02/21           MRN: [id number] 2Visit Date: 04/12/2020              Requested by: Arlina Robes, MD [address] [place],  [place] [address] PCP: Arlina Robes, MD   Assessment & Plan: Visit Diagnoses:  1. Pain in left ankle and joints of left foot   2. Pain in left hip   3. Left-sided low back pain with left-sided sciatica, unspecified chronicity     Plan: Most likely this is musculoskeletal strain from his mechanical fall and likely some contusions as well to the left side.  I will try a short course of hydrocodone and Zanaflex.  He does have Celebrex at home schedule take this.  Hopefully with time this will improve.  We can certainly see him back in 2 weeks to make sure he is feeling better.  All questions and concerns were answered and addressed.  Follow-Up Instructions: Return in about 2 weeks (around 04/26/2020).   Orders:  Orders Placed This Encounter  Procedures  . XR Lumbar Spine 2-3 Views  . XR HIP UNILAT W OR W/O PELVIS 2-3 VIEWS LEFT  . XR Ankle Complete Left   Meds ordered this encounter  Medications  . tiZANidine (ZANAFLEX) 4 MG tablet    Sig: Take 1 tablet (4 mg total) by mouth every 8 (eight) hours as needed for muscle spasms.    Dispense:  60 tablet    Refill:  1  . HYDROcodone-acetaminophen (NORCO) 7.5-325 MG tablet    Sig: Take 1-2 tablets by mouth every 6 (six) hours as needed for moderate pain.    Dispense:  30 tablet    Refill:  0      Procedures: No procedures performed   Clinical Data: No additional findings.   Subjective: Chief Complaint  Patient presents with  . Left Hip - Pain  . Left Ankle - Pain  The patient comes in today for evaluation of left hip and left ankle pain after mechanical fall last week when he slipped on icy surface.  He has tried ice and heat and anti-inflammatories but that has not helped at all.  He does have a remote  history of a left ankle fracture.  There is left ankle does hurt quite a bit as well as his left knee and his back as well as his hip.  We did replace his left hip a few years ago.  He is a diabetic but reports good control.  He reports some numbness going down his left leg.  HPI  Review of Systems He currently denies any chest pain, bowel bladder function changes, shortness of breath, fever, chills, nausea, vomiting  Objective: Vital Signs: Ht 5\' 9"  (1.753 m)   Wt 278 lb (126.1 kg)   BMI 41.05 kg/m   Physical Exam He is alert and orient x3 and in no acute distress Ortho Exam Examination of his left hip shows it moves smoothly and fluidly with some pain over the trochanteric area and sciatic area.  There is oblique muscle pain in his low back and low back pain to the left side.  His left ankle is well located to move smoothly and fluidly with no swelling. Specialty Comments:  No specialty comments available.  Imaging: XR Ankle Complete Left  Result Date: 04/12/2020 3 views left  ankle show no acute findings.  The ankle mortise is well located.  There is an old healed fibular fracture.  There are small osteophytes around the ankle joint suggesting old trauma.  There is no soft tissue swelling.  XR HIP UNILAT W OR W/O PELVIS 2-3 VIEWS LEFT  Result Date: 04/12/2020 An AP pelvis and lateral left hip shows a well seated total hip arthroplasty with no complicating features or acute findings.  XR Lumbar Spine 2-3 Views  Result Date: 04/12/2020 2 views of the lumbar spine show no acute findings.  There is significant disc space narrowing between L5 and S1.  This is not changed from films compared to 2020.    PMFS History: Patient Active Problem List   Diagnosis Date Noted  . Unilateral primary osteoarthritis, right knee 01/25/2019  . Carpal tunnel syndrome, left upper limb 10/15/2018  . Carpal tunnel syndrome, right upper limb 10/15/2018  . Bilateral hand numbness 09/09/2018  .  Plantar fasciitis of left foot 09/09/2018  . Status post total replacement of left hip 12/26/2017  . Morbid (severe) obesity due to excess calories (HCC) 11/27/2017  . Unilateral primary osteoarthritis, left knee 11/12/2017  . Unilateral primary osteoarthritis, left hip 11/12/2017  . Hypogonadism, male 05/08/2016   Past Medical History:  Diagnosis Date  . Asthma   . Current every day smoker   . Depression   . Diabetes mellitus, type II (HCC)   . Hyperlipidemia   . Hypertension   . Morbid obesity (HCC)   . Sedentary lifestyle     Family History  Problem Relation Age of Onset  . Diabetes Mother   . Hypertension Mother   . Heart attack Mother   . Osteoporosis Mother   . Diabetes Father   . Hypertension Father   . Heart attack Father   . Diabetes Sister   . Hypertension Sister   . Diabetes Brother   . Hypertension Brother     Past Surgical History:  Procedure Laterality Date  . OTHER SURGICAL HISTORY     R & L shoulder  . OTHER SURGICAL HISTORY     Knee surgery  . TOTAL HIP ARTHROPLASTY Left 12/26/2017   Procedure: LEFT TOTAL HIP ARTHROPLASTY ANTERIOR APPROACH;  Surgeon: Kathryne Hitch, MD;  Location: [facility];  Service: Orthopedics;  Laterality: Left;   Social History   Occupational History  . Not on file  Tobacco Use  . Smoking status: Former Smoker    Packs/day: 1.50    Years: 25.00    Pack years: 45.50    Quit date: 12/15/2017    Years since quitting: 2.3  . Smokeless tobacco: Never Used  Vaping Use  . Vaping Use: Never used  Substance and Sexual Activity  . Alcohol use: No  . Drug use: Yes    Types: Marijuana  . Sexual activity: Not on file

## 2020-04-13 ENCOUNTER — Other Ambulatory Visit: Payer: Self-pay | Admitting: Orthopaedic Surgery

## 2020-04-13 ENCOUNTER — Telehealth: Payer: Self-pay

## 2020-04-13 MED ORDER — CYCLOBENZAPRINE HCL 10 MG PO TABS: 10.0000 mg | ORAL_TABLET | Freq: Three times a day (TID) | ORAL | 1 refills | Status: DC | PRN

## 2020-04-13 NOTE — Telephone Encounter (Signed)
Please advise 

## 2020-04-13 NOTE — Telephone Encounter (Signed)
Patient wife called she is requesting rx refill for flexeril called into the pharmacy [phone]   [facility] [phone] - [place], [place] - [address]  [address] [place] [address]

## 2020-05-08 ENCOUNTER — Encounter: Payer: Self-pay | Admitting: Orthopaedic Surgery

## 2020-05-08 ENCOUNTER — Ambulatory Visit (INDEPENDENT_AMBULATORY_CARE_PROVIDER_SITE_OTHER): Payer: Medicare PPO | Admitting: Orthopaedic Surgery

## 2020-05-08 DIAGNOSIS — M25552 Pain in left hip: Secondary | ICD-10-CM | POA: Diagnosis not present

## 2020-05-08 DIAGNOSIS — M5442 Lumbago with sciatica, left side: Secondary | ICD-10-CM

## 2020-05-08 NOTE — Progress Notes (Signed)
I saw the patient just 2 weeks ago for left hip and ankle pain as well as low back pain after mechanical fall when he slipped on ice.  After having a steroid taper as well as some pain medication and anti-inflammatories he is feeling better.  He says at rest he does great with activities he is certainly hurting more but it has not gotten worse.  Most the pain seems to be in the low back to the left side.  Examination of his left hip is normal today as well as his left foot and ankle.  He is still slow to get up.  His weight is over 270 pounds.  Some of this pain is more back related.  Previous x-rays of his lumbar spine shows degenerative slight spondylolisthesis of L4 on L5 and degenerative disc disease at L5 on S1.  Is essential that we put him in some physical therapy just to help with his lumbar spine and low back pain.  Of advocated weight loss as well.  I gave him prescription for outpatient physical therapy for up in [place].  All questions and concerns were answered and addressed.  We can certainly see him back in 2 months to see how he is doing overall but no x-rays are needed.

## 2020-06-01 ENCOUNTER — Other Ambulatory Visit: Payer: Self-pay | Admitting: Orthopaedic Surgery

## 2020-07-01 IMAGING — RF DG HIP (WITH PELVIS) OPERATIVE*L*
1 series · 5 of 5 positions shown · non-contrast
Comparison: None.

CLINICAL DATA: LEFT hip replacement.

EXAM:
OPERATIVE LEFT HIP (WITH PELVIS IF PERFORMED) 5 VIEWS
TECHNIQUE: Fluoroscopic spot image(s) were submitted for interpretation
post-operatively.

[Series 1: unknown protocol · 0.20mm/px · 5 of 5 slices shown]
[im 1/5]
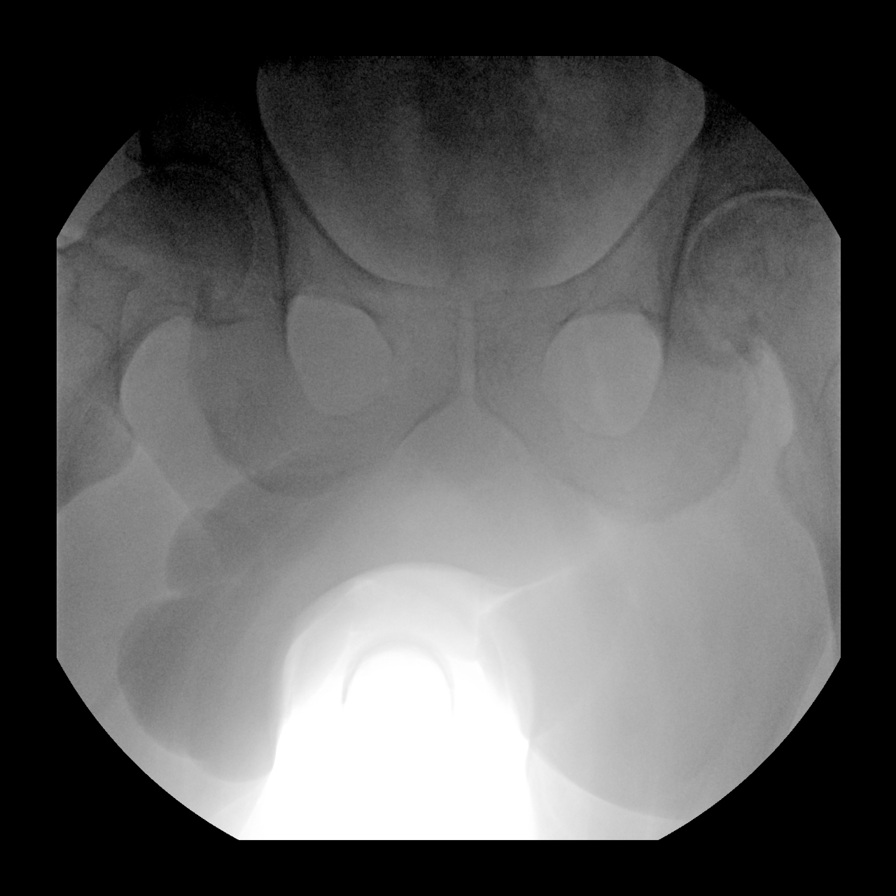
[im 2/5]
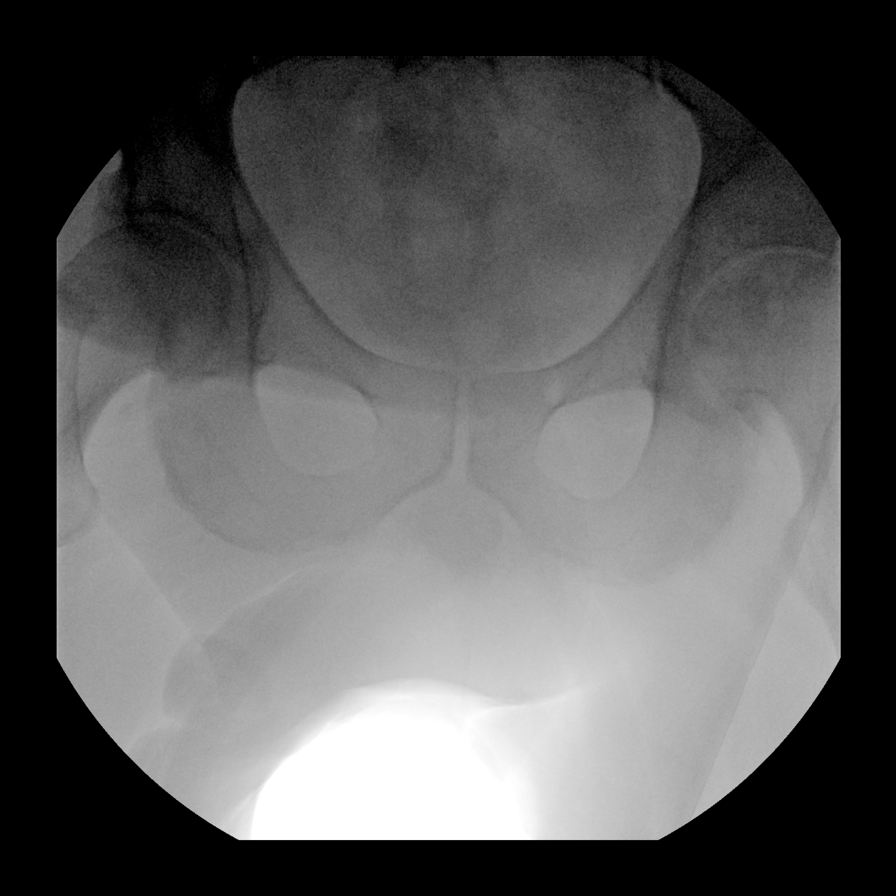
[im 3/5]
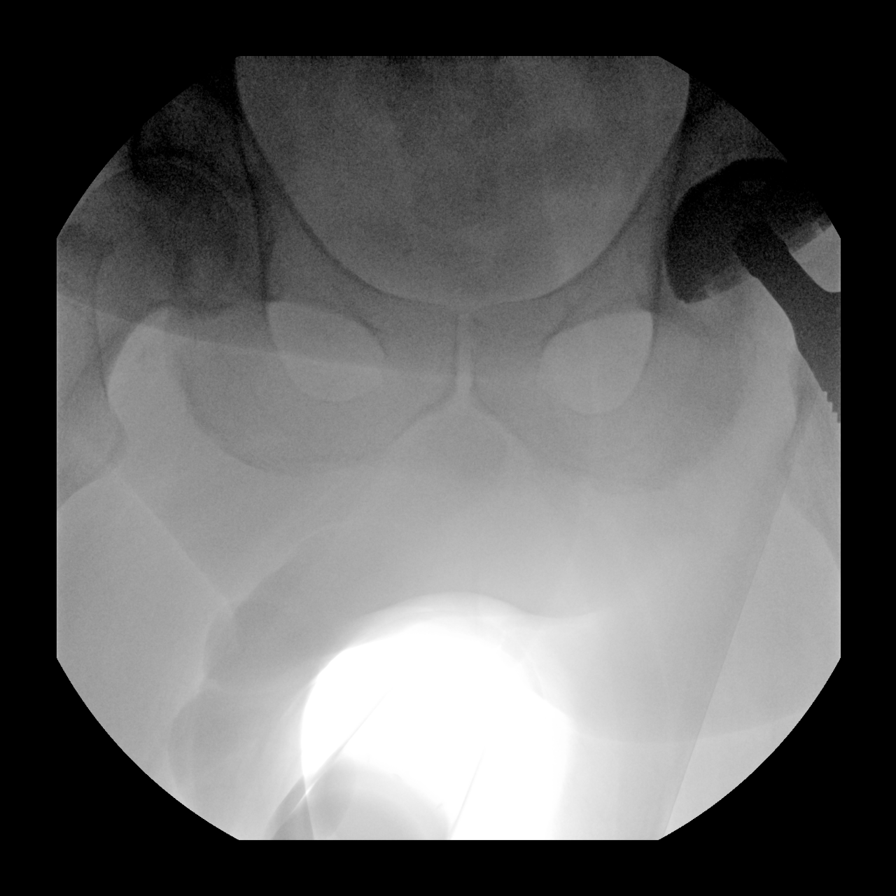
[im 4/5]
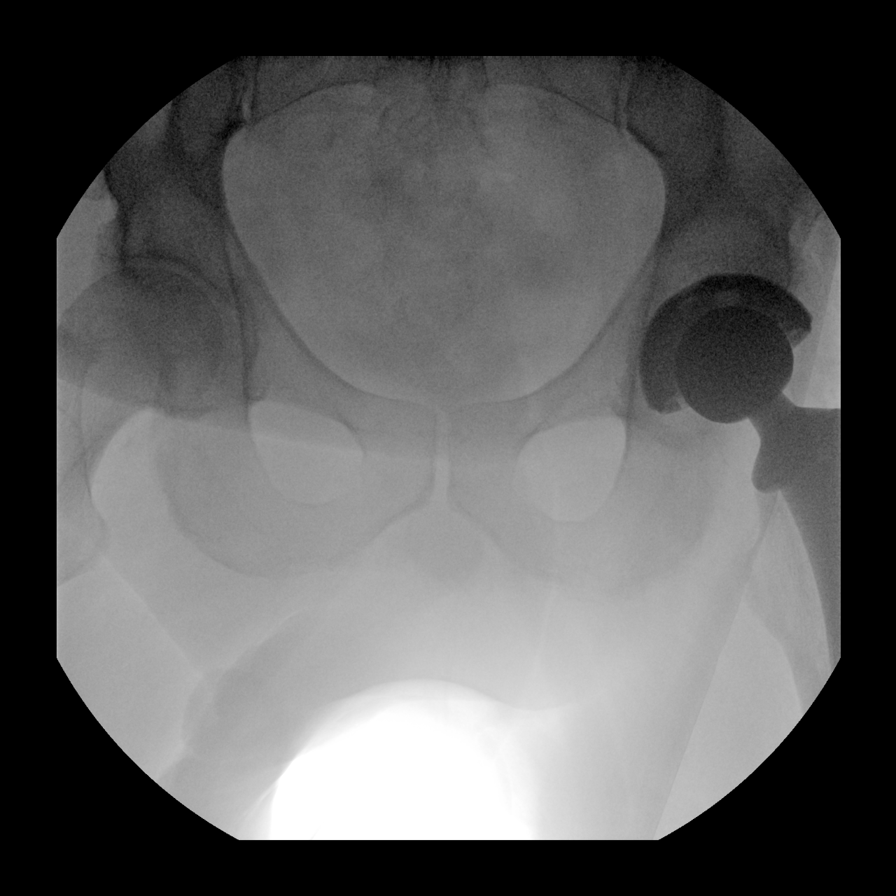
[im 5/5]
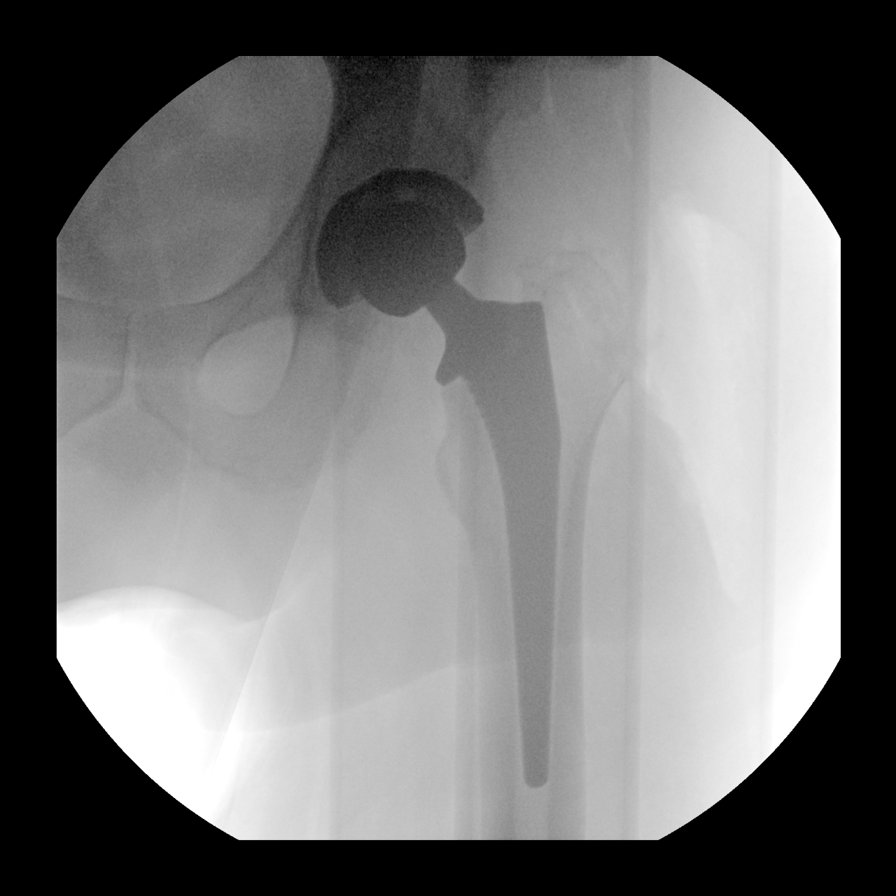

[5 of 5 positions shown; findings below may reference images not displayed]

FINDINGS: Intraoperative fluoroscopy views show LEFT hip arthroplasty hardware
placement. Hardware appears intact and appropriately positioned.
Osseous alignment is anatomic.

Fluoroscopy provided for 25 seconds.  8.8 mGy.
IMPRESSION: Intraoperative fluoroscopic images demonstrating surgical changes of
LEFT hip arthroplasty. No evidence of surgical complicating feature.

## 2021-08-20 ENCOUNTER — Ambulatory Visit: Payer: Medicare PPO | Admitting: Orthopaedic Surgery

## 2021-08-20 ENCOUNTER — Ambulatory Visit (INDEPENDENT_AMBULATORY_CARE_PROVIDER_SITE_OTHER): Payer: Medicare PPO

## 2021-08-20 ENCOUNTER — Encounter: Payer: Self-pay | Admitting: Orthopaedic Surgery

## 2021-08-20 DIAGNOSIS — G8929 Other chronic pain: Secondary | ICD-10-CM

## 2021-08-20 DIAGNOSIS — M25562 Pain in left knee: Secondary | ICD-10-CM

## 2021-08-20 DIAGNOSIS — M79641 Pain in right hand: Secondary | ICD-10-CM

## 2021-08-20 MED ORDER — METHYLPREDNISOLONE ACETATE 40 MG/ML IJ SUSP
40.0000 mg | INTRAMUSCULAR | Status: AC | PRN
  Administered 2021-08-20: 40 mg via INTRA_ARTICULAR

## 2021-08-20 MED ORDER — LIDOCAINE HCL 1 % IJ SOLN
3.0000 mL | INTRAMUSCULAR | Status: AC | PRN
  Administered 2021-08-20: 3 mL

## 2021-08-20 NOTE — Progress Notes (Signed)
Office Visit Note   Patient: Frank [name].           Date of Birth: May 05, 1965           MRN: [id number] Visit Date: 08/20/2021              Requested by: Andres Shad, MD [address] [place] [address] PCP: Andres Shad, MD   Assessment & Plan: Visit Diagnoses:  1. Pain in right hand   2. Chronic pain of left knee     Plan: I did give him reassurance that there was no fractures of his right hand and he can use his hand as comfort allows.  I was able to aspirate 110 cc of clear yellow fluid off his left knee and placed a steroid injection of the left knee.  From the standpoint of his left knee, I have recommended knee replacement for if his knee gets bad enough and hurts bad enough he will let us know.  We can always reaspirate the knee in 3 months and placed another steroid injection if needed.  Follow-Up Instructions: Return if symptoms worsen or fail to improve.   Orders:  Orders Placed This Encounter  Procedures   Large Joint Inj   XR Knee 1-2 Views Left   XR Hand Complete Right   No orders of the defined types were placed in this encounter.     Procedures: Large Joint Inj: L knee on 08/20/2021 2:49 PM Indications: diagnostic evaluation and pain Details: 22 G 1.5 in needle, superolateral approach  Arthrogram: No  Medications: 3 mL lidocaine 1 %; 40 mg methylPREDNISolone acetate 40 MG/ML Outcome: tolerated well, no immediate complications Procedure, treatment alternatives, risks and benefits explained, specific risks discussed. Consent was given by the patient. Immediately prior to procedure a time out was called to verify the correct patient, procedure, equipment, support staff and site/side marked as required. Patient was prepped and draped in the usual sterile fashion.      Clinical Data: No additional findings.   Subjective: Chief Complaint  Patient presents with   Left Knee - Pain   Right Hand - Pain  The patient comes in  today with acute on chronic left knee pain and right hand pain after mechanical fall when he was helping his mother in law.  He is right-hand dominant.  He reports that his middle and ring fingers got stretched back.  We have seen him for his knees before and he has severe end-stage arthritis in both knees.  He reports a large amount of fluid on his left knee.  He is ambulate with a cane.  He is a diabetic.  He used to be morbidly obese but has lost a decent amount of weight.  He reports good blood glucose control.  HPI  Review of Systems   Objective: Vital Signs: There were no vitals taken for this visit.  Physical Exam  Ortho Exam Examination of his right hand shows no bruising or swelling.  There is no rotation deformity of his digits and he can flex and extend his fingers.  There is some pain over the ulnar styloid.  Examination of his left knee shows a very large effusion with varus malalignment.  This inhibits his range of motion but he can bend and flex his knee as well as extend the knee so that is intact in terms of extensor mechanism.  The knee feels ligamentously stable. Specialty Comments:  No specialty comments available.  Imaging: XR Knee 1-2 Views Left  Result Date: 08/20/2021 2 views of the left knee show no acute findings.  There is tricompartment arthritis with varus malalignment, bone-on-bone wear of the medial compartment patellofemoral joint and osteophytes in all 3 compartments.  XR Hand Complete Right  Result Date: 08/20/2021 3 views of the right hand are negative for any acute injuries.    PMFS History: Patient Active Problem List   Diagnosis Date Noted   Unilateral primary osteoarthritis, right knee 01/25/2019   Carpal tunnel syndrome, left upper limb 10/15/2018   Carpal tunnel syndrome, right upper limb 10/15/2018   Bilateral hand numbness 09/09/2018   Plantar fasciitis of left foot 09/09/2018   Status post total replacement of left hip 12/26/2017   Morbid  (severe) obesity due to excess calories ([place]) 11/27/2017   Unilateral primary osteoarthritis, left knee 11/12/2017   Unilateral primary osteoarthritis, left hip 11/12/2017   Hypogonadism, male 05/08/2016   Past Medical History:  Diagnosis Date   Asthma    Current every day smoker    Depression    Diabetes mellitus, type II ([place])    Hyperlipidemia    Hypertension    Morbid obesity ([facility])    Sedentary lifestyle     Family History  Problem Relation Age of Onset   Diabetes Mother    Hypertension Mother    Heart attack Mother    Osteoporosis Mother    Diabetes Father    Hypertension Father    Heart attack Father    Diabetes Sister    Hypertension Sister    Diabetes Brother    Hypertension Brother     Past Surgical History:  Procedure Laterality Date   OTHER SURGICAL HISTORY     R & L shoulder   OTHER SURGICAL HISTORY     Knee surgery   TOTAL HIP ARTHROPLASTY Left 12/26/2017   Procedure: LEFT TOTAL HIP ARTHROPLASTY ANTERIOR APPROACH;  Surgeon: Mcarthur Rossetti, MD;  Location: [facility];  Service: Orthopedics;  Laterality: Left;   Social History   Occupational History   Not on file  Tobacco Use   Smoking status: Former    Packs/day: 1.50    Years: 25.00    Pack years: 37.50    Types: Cigarettes    Quit date: 12/15/2017    Years since quitting: 3.6   Smokeless tobacco: Never  Vaping Use   Vaping Use: Never used  Substance and Sexual Activity   Alcohol use: No   Drug use: Yes    Types: Marijuana   Sexual activity: Not on file

## 2021-09-19 ENCOUNTER — Telehealth: Payer: Self-pay | Admitting: Orthopaedic Surgery

## 2021-09-19 NOTE — Telephone Encounter (Signed)
Schedule?  Not sure current BMI or A1c.

## 2021-09-19 NOTE — Telephone Encounter (Signed)
Pt is ready for surgery

## 2021-09-28 NOTE — Telephone Encounter (Signed)
I called and spoke with wife.  Scheduled surgery.

## 2021-10-11 ENCOUNTER — Other Ambulatory Visit: Payer: Self-pay

## 2021-11-06 ENCOUNTER — Telehealth: Payer: Self-pay

## 2021-11-06 DIAGNOSIS — G8929 Other chronic pain: Secondary | ICD-10-CM

## 2021-11-06 NOTE — Addendum Note (Signed)
Addended by: Barbette Or on: 11/06/2021 03:16 PM   Modules accepted: Orders

## 2021-11-06 NOTE — Telephone Encounter (Signed)
Patient is scheduled for left TKA on 11/23/21.  He wants to go to [facility] for [facility] in [place] ph # [phone].  He does not want HH PT.

## 2021-11-06 NOTE — Telephone Encounter (Signed)
Please advise 

## 2021-11-06 NOTE — Telephone Encounter (Signed)
Order placed in chart. Wife wanted to know what kind of anesthesia pt will have. She stated he did so much better after epidural with his hip surgery, please advise

## 2021-11-07 NOTE — Telephone Encounter (Signed)
Called and advised.

## 2021-11-08 NOTE — Progress Notes (Signed)
Pt. Needs orders for surgery. 

## 2021-11-08 NOTE — Patient Instructions (Addendum)
DUE TO COVID-19 ONLY TWO VISITORS  (aged 57 and older)  ARE ALLOWED TO COME WITH YOU AND STAY IN THE WAITING ROOM ONLY DURING PRE OP AND PROCEDURE.   **NO VISITORS ARE ALLOWED IN THE SHORT STAY AREA OR RECOVERY ROOM!!**  IF YOU WILL BE ADMITTED INTO THE HOSPITAL YOU ARE ALLOWED ONLY FOUR SUPPORT PEOPLE DURING VISITATION HOURS ONLY (7 AM -8PM)   The support person(s) must pass our screening, gel in and out, and wear a mask at all times, including in the patient's room. Patients must also wear a mask when staff or their support person are in the room. Visitors GUEST BADGE MUST BE WORN VISIBLY  One adult visitor may remain with you overnight and MUST be in the room by 8 P.M.     Your procedure is scheduled on: 11/23/21   Report to [facility] Main Entrance    Report to admitting at : 5:45 AM   Call this number if you have problems the morning of surgery [phone]   Do not eat food :After Midnight.   After Midnight you may have the following liquids until: 5:00 AM DAY OF SURGERY  Water Black Coffee (sugar ok, NO MILK/CREAM OR CREAMERS)  Tea (sugar ok, NO MILK/CREAM OR CREAMERS) regular and decaf                             Plain Jell-O (NO RED)                                           Fruit ices (not with fruit pulp, NO RED)                                     Popsicles (NO RED)                                                                  Juice: apple, WHITE grape, WHITE cranberry Sports drinks like Gatorade (NO RED)              Oral Hygiene is also important to reduce your risk of infection.                                    Remember - BRUSH YOUR TEETH THE MORNING OF SURGERY WITH YOUR REGULAR TOOTHPASTE   Do NOT smoke after Midnight   Take these medicines the morning of surgery with A SIP OF WATER: buspirone,hydroxyzine,divalproex,gabapentin,paroxetine,hydralazine,verapamil,tamsulosin,esomeprazole.Use inhalers as usual.  How to Manage Your Diabetes Before and  After Surgery  Why is it important to control my blood sugar before and after surgery? Improving blood sugar levels before and after surgery helps healing and can limit problems. A way of improving blood sugar control is eating a healthy diet by:  Eating less sugar and carbohydrates  Increasing activity/exercise  Talking with your doctor about reaching your blood sugar goals High blood sugars (greater than 180 mg/dL) can raise your risk of  infections and slow your recovery, so you will need to focus on controlling your diabetes during the weeks before surgery. Make sure that the doctor who takes care of your diabetes knows about your planned surgery including the date and location.  How do I manage my blood sugar before surgery? Check your blood sugar at least 4 times a day, starting 2 days before surgery, to make sure that the level is not too high or low. Check your blood sugar the morning of your surgery when you wake up and every 2 hours until you get to the Short Stay unit. If your blood sugar is less than 70 mg/dL, you will need to treat for low blood sugar: Do not take insulin. Treat a low blood sugar (less than 70 mg/dL) with  cup of clear juice (cranberry or apple), 4 glucose tablets, OR glucose gel. Recheck blood sugar in 15 minutes after treatment (to make sure it is greater than 70 mg/dL). If your blood sugar is not greater than 70 mg/dL on recheck, call [phone] for further instructions. Report your blood sugar to the short stay nurse when you get to Short Stay.  If you are admitted to the hospital after surgery: Your blood sugar will be checked by the staff and you will probably be given insulin after surgery (instead of oral diabetes medicines) to make sure you have good blood sugar levels. The goal for blood sugar control after surgery is 80-180 mg/dL.   WHAT DO I DO ABOUT MY DIABETES MEDICATION?  Do not take oral diabetes medicines (pills) the morning of  surgery.  THE DAY BEFORE SURGERY, take metformin as usual.      THE MORNING OF SURGERY, DO NOT TAKE ANY ORAL DIABETIC MEDICATIONS DAY OF YOUR SURGERY  Bring CPAP mask and tubing day of surgery.                              You may not have any metal on your body including hair pins, jewelry, and body piercing             Do not wear lotions, powders, perfumes/cologne, or deodorant              Men may shave face and neck.   Do not bring valuables to the hospital. [facility] IS NOT             RESPONSIBLE   FOR VALUABLES.   Contacts, dentures or bridgework may not be worn into surgery.   Bring small overnight bag day of surgery.   DO NOT BRING YOUR HOME MEDICATIONS TO THE HOSPITAL. PHARMACY WILL DISPENSE MEDICATIONS LISTED ON YOUR MEDICATION LIST TO YOU DURING YOUR ADMISSION IN THE HOSPITAL!    Patients discharged on the day of surgery will not be allowed to drive home.  Someone NEEDS to stay with you for the first 24 hours after anesthesia.   Special Instructions: Bring a copy of your healthcare power of attorney and living will documents         the day of surgery if you haven't scanned them before.              Please read over the following fact sheets you were given: IF YOU HAVE QUESTIONS ABOUT YOUR PRE-OP INSTRUCTIONS PLEASE CALL [phone]     [facility] - Preparing for Surgery Before surgery, you can play an important role.  Because skin is not sterile, your  skin needs to be as free of germs as possible.  You can reduce the number of germs on your skin by washing with CHG (chlorahexidine gluconate) soap before surgery.  CHG is an antiseptic cleaner which kills germs and bonds with the skin to continue killing germs even after washing. Please DO NOT use if you have an allergy to CHG or antibacterial soaps.  If your skin becomes reddened/irritated stop using the CHG and inform your nurse when you arrive at Short Stay. Do not shave (including legs and underarms) for at least  48 hours prior to the first CHG shower.  You may shave your face/neck. Please follow these instructions carefully:  1.  Shower with CHG Soap the night before surgery and the  morning of Surgery.  2.  If you choose to wash your hair, wash your hair first as usual with your  normal  shampoo.  3.  After you shampoo, rinse your hair and body thoroughly to remove the  shampoo.                           4.  Use CHG as you would any other liquid soap.  You can apply chg directly  to the skin and wash                       Gently with a scrungie or clean washcloth.  5.  Apply the CHG Soap to your body ONLY FROM THE NECK DOWN.   Do not use on face/ open                           Wound or open sores. Avoid contact with eyes, ears mouth and genitals (private parts).                       Wash face,  Genitals (private parts) with your normal soap.             6.  Wash thoroughly, paying special attention to the area where your surgery  will be performed.  7.  Thoroughly rinse your body with warm water from the neck down.  8.  DO NOT shower/wash with your normal soap after using and rinsing off  the CHG Soap.                9.  Pat yourself dry with a clean towel.            10.  Wear clean pajamas.            11.  Place clean sheets on your bed the night of your first shower and do not  sleep with pets. Day of Surgery : Do not apply any lotions/deodorants the morning of surgery.  Please wear clean clothes to the hospital/surgery center.  FAILURE TO FOLLOW THESE INSTRUCTIONS MAY RESULT IN THE CANCELLATION OF YOUR SURGERY PATIENT SIGNATURE_________________________________  NURSE SIGNATURE__________________________________  ________________________________________________________________________

## 2021-11-12 ENCOUNTER — Encounter (HOSPITAL_COMMUNITY)
Admission: RE | Admit: 2021-11-12 | Discharge: 2021-11-12 | Disposition: A | Discharge status: Home / Self Care | Payer: Medicare PPO | Source: Ambulatory Visit | Attending: Orthopaedic Surgery | Admitting: Orthopaedic Surgery

## 2021-11-12 ENCOUNTER — Other Ambulatory Visit: Payer: Self-pay

## 2021-11-12 ENCOUNTER — Encounter (HOSPITAL_COMMUNITY): Payer: Self-pay

## 2021-11-12 VITALS — BP 167/117 | HR 74 | Temp 97.7°F | Ht 72.0 in | Wt 264.0 lb

## 2021-11-12 DIAGNOSIS — E119 Type 2 diabetes mellitus without complications: Secondary | ICD-10-CM | POA: Insufficient documentation

## 2021-11-12 DIAGNOSIS — Z01818 Encounter for other preprocedural examination: Secondary | ICD-10-CM | POA: Diagnosis present

## 2021-11-12 DIAGNOSIS — I2510 Atherosclerotic heart disease of native coronary artery without angina pectoris: Secondary | ICD-10-CM | POA: Diagnosis not present

## 2021-11-12 HISTORY — DX: Unspecified osteoarthritis, unspecified site: M19.90

## 2021-11-12 HISTORY — DX: Dyspnea, unspecified: R06.00

## 2021-11-12 HISTORY — DX: Anxiety disorder, unspecified: F41.9

## 2021-11-12 LAB — CBC
HCT: 37.6 % — ABNORMAL LOW (ref 39.0–52.0)
Hemoglobin: 11.8 g/dL — ABNORMAL LOW (ref 13.0–17.0)
MCH: 27.7 pg (ref 26.0–34.0)
MCHC: 31.4 g/dL (ref 30.0–36.0)
MCV: 88.3 fL (ref 80.0–100.0)
Platelets: 256 10*3/uL (ref 150–400)
RBC: 4.26 MIL/uL (ref 4.22–5.81)
RDW: 14.8 % (ref 11.5–15.5)
WBC: 4.4 10*3/uL (ref 4.0–10.5)
nRBC: 0 % (ref 0.0–0.2)

## 2021-11-12 LAB — HEMOGLOBIN A1C
Hgb A1c MFr Bld: 6.5 % — ABNORMAL HIGH (ref 4.8–5.6)
Mean Plasma Glucose: 139.85 mg/dL

## 2021-11-12 LAB — BASIC METABOLIC PANEL
Anion gap: 7 (ref 5–15)
BUN: 12 mg/dL (ref 6–20)
CO2: 26 mmol/L (ref 22–32)
Calcium: 8.5 mg/dL — ABNORMAL LOW (ref 8.9–10.3)
Chloride: 106 mmol/L (ref 98–111)
Creatinine, Ser: 0.93 mg/dL (ref 0.61–1.24)
GFR, Estimated: >60 mL/min (ref >60)
Glucose, Bld: 88 mg/dL (ref 70–99)
Potassium: 4.1 mmol/L (ref 3.5–5.1)
Sodium: 139 mmol/L (ref 135–145)

## 2021-11-12 LAB — GLUCOSE, CAPILLARY: Glucose-Capillary: 103 mg/dL — ABNORMAL HIGH (ref 70–99)

## 2021-11-12 LAB — SURGICAL PCR SCREEN
MRSA, PCR: NEGATIVE
Staphylococcus aureus: NEGATIVE

## 2021-11-12 NOTE — Progress Notes (Signed)
For Short Stay: COVID SWAB appointment date: Date of COVID positive in last 90 days:  Bowel Prep reminder:   For Anesthesia: PCP - Dr. Ardean Larsen Cardiologist -   Chest x-ray -  EKG -  Stress Test -  ECHO -  Cardiac Cath -  Pacemaker/ICD device last checked: Pacemaker orders received: Device Rep notified:  Spinal Cord Stimulator:  Sleep Study -  CPAP -   Fasting Blood Sugar - 90's - 110's Checks Blood Sugar __2___ times a day Date and result of last Hgb A1c-  Blood Thinner Instructions: Aspirin Instructions: Last Dose:  Activity level: Can go up a flight of stairs and activities of daily living without stopping and without chest pain and/or shortness of breath   Able to exercise without chest pain and/or shortness of breath   Unable to go up a flight of stairs without chest pain and/or shortness of breath     Anesthesia review: Hx: DIA,HTN,Smoker (Pt. Said he's aware about stop smoking before surgery). BP: was hight during PAT visit: 174/106, 167/117.Pt. is having PCP appointment on: 11/20/21. Shanda Bumps PA is aware.  Patient denies shortness of breath, fever, cough and chest pain at PAT appointment   Patient verbalized understanding of instructions that were given to them at the PAT appointment. Patient was also instructed that they will need to review over the PAT instructions again at home before surgery.

## 2021-11-13 ENCOUNTER — Other Ambulatory Visit (HOSPITAL_COMMUNITY): Payer: Medicare PPO

## 2021-11-14 ENCOUNTER — Other Ambulatory Visit: Payer: Self-pay | Admitting: Physician Assistant

## 2021-11-14 DIAGNOSIS — M1712 Unilateral primary osteoarthritis, left knee: Secondary | ICD-10-CM

## 2021-11-15 NOTE — Progress Notes (Signed)
Received call from pt's wife,to notify about BP medication changes. As per pt's wife,PCP stopped verapamil,lisinopril, hydralazine,lasix and aldactone.Also he started him on olmesartan-HCT 40-25.

## 2021-11-22 NOTE — H&P (Signed)
TOTAL KNEE ADMISSION H&P  Patient is being admitted for left total knee arthroplasty.  Subjective:  Chief Complaint:left knee pain.  HPI: Frank [name]., 56 y.o. male, has a history of pain and functional disability in the left knee due to arthritis and has failed non-surgical conservative treatments for greater than 12 weeks to includeNSAID's and/or analgesics, corticosteriod injections, flexibility and strengthening excercises, use of assistive devices, weight reduction as appropriate, and activity modification.  Onset of symptoms was gradual, starting 2 years ago with gradually worsening course since that time. The patient noted prior procedures on the knee to include  arthroscopy on the left knee(s).  Patient currently rates pain in the left knee(s) at 10 out of 10 with activity. Patient has night pain, worsening of pain with activity and weight bearing, pain that interferes with activities of daily living, pain with passive range of motion, crepitus, and joint swelling.  Patient has evidence of subchondral sclerosis, periarticular osteophytes, and joint space narrowing by imaging studies. There is no active infection.  Patient Active Problem List   Diagnosis Date Noted   Unilateral primary osteoarthritis, right knee 01/25/2019   Carpal tunnel syndrome, left upper limb 10/15/2018   Carpal tunnel syndrome, right upper limb 10/15/2018   Bilateral hand numbness 09/09/2018   Plantar fasciitis of left foot 09/09/2018   Status post total replacement of left hip 12/26/2017   Unilateral primary osteoarthritis, left knee 11/12/2017   Hypogonadism, male 05/08/2016   Past Medical History:  Diagnosis Date   Anxiety    Arthritis    Asthma    Current every day smoker    Depression    Diabetes mellitus, type II (HCC)    Dyspnea    Hyperlipidemia    Hypertension    Morbid obesity (HCC)    Sedentary lifestyle     Past Surgical History:  Procedure Laterality Date   carpal tunel Left     OTHER SURGICAL HISTORY     R & L shoulder   OTHER SURGICAL HISTORY     Knee surgery   TOTAL HIP ARTHROPLASTY Left 12/26/2017   Procedure: LEFT TOTAL HIP ARTHROPLASTY ANTERIOR APPROACH;  Surgeon: Kathryne Hitch, MD;  Location: [facility];  Service: Orthopedics;  Laterality: Left;    No current facility-administered medications for this encounter.   Current Outpatient Medications  Medication Sig Dispense Refill Last Dose   albuterol (PROVENTIL HFA;VENTOLIN HFA) 108 (90 Base) MCG/ACT inhaler Inhale 2 puffs into the lungs every 6 (six) hours as needed for wheezing or shortness of breath.      atorvastatin (LIPITOR) 40 MG tablet Take 40 mg by mouth daily.      busPIRone (BUSPAR) 15 MG tablet Take 15 mg by mouth 2 (two) times daily.      celecoxib (CELEBREX) 200 MG capsule Take 200 mg by mouth daily.      cyclobenzaprine (FLEXERIL) 5 MG tablet Take 5 mg by mouth 2 (two) times daily as needed for muscle spasms.      divalproex (DEPAKOTE) 500 MG DR tablet Take 500 mg by mouth 3 (three) times daily.      EPINEPHrine 0.3 mg/0.3 mL IJ SOAJ injection Inject 0.3 mg into the muscle as needed for anaphylaxis.      esomeprazole (NEXIUM) 40 MG capsule Take 40 mg by mouth daily.       Fluticasone-Salmeterol (ADVAIR) 500-50 MCG/DOSE AEPB Inhale 1 puff into the lungs in the morning.      furosemide (LASIX) 40 MG tablet Take  40 mg by mouth. (Patient not taking: Reported on 11/15/2021)      gabapentin (NEURONTIN) 300 MG capsule Take 300 mg by mouth 3 (three) times daily as needed (pain).      hydrALAZINE (APRESOLINE) 10 MG tablet Take 10 mg by mouth daily. (Patient not taking: Reported on 11/15/2021)      hydrOXYzine (ATARAX) 25 MG tablet Take 25 mg by mouth daily.      lisinopril (PRINIVIL,ZESTRIL) 20 MG tablet Take 10 mg by mouth in the morning and at bedtime. (Patient not taking: Reported on 11/15/2021)      metFORMIN (GLUCOPHAGE) 1000 MG tablet Take 1,000 mg by mouth 2 (two) times daily with a meal.       methocarbamol (ROBAXIN) 750 MG tablet Take 750-1,500 mg by mouth 3 (three) times daily as needed for muscle spasms.      montelukast (SINGULAIR) 10 MG tablet Take 10 mg by mouth at bedtime.      OVER THE COUNTER MEDICATION Take 1 capsule by mouth daily. Burdock Root Supplement      PARoxetine (PAXIL) 40 MG tablet Take 40 mg by mouth every morning.      spironolactone (ALDACTONE) 25 MG tablet Take 25 mg by mouth at bedtime. (Patient not taking: Reported on 11/15/2021)      tamsulosin (FLOMAX) 0.4 MG CAPS capsule Take 0.4 mg by mouth daily.      testosterone cypionate (DEPOTESTOSTERONE CYPIONATE) 200 MG/ML injection Inject 0.5 mLs (100 mg total) into the muscle every 14 (fourteen) days. (Patient taking differently: Inject 200 mg into the muscle every 14 (fourteen) days.) 10 mL 0    verapamil (CALAN) 120 MG tablet Take 120 mg by mouth daily. (Patient not taking: Reported on 11/15/2021)      cyclobenzaprine (FLEXERIL) 10 MG tablet TAKE 1 TABLET BY MOUTH THREE TIMES A DAY AS NEEDED FOR MUSCLE SPASMS (Patient not taking: Reported on 11/08/2021) 60 tablet 1 Not Taking   HYDROcodone-acetaminophen (NORCO) 7.5-325 MG tablet Take 1-2 tablets by mouth every 6 (six) hours as needed for moderate pain. (Patient not taking: Reported on 11/08/2021) 30 tablet 0 Not Taking   methocarbamol (ROBAXIN) 500 MG tablet Take 1 tablet (500 mg total) by mouth every 6 (six) hours as needed for muscle spasms. (Patient not taking: Reported on 11/08/2021) 40 tablet 0 Not Taking   olmesartan-hydrochlorothiazide (BENICAR HCT) 40-25 MG tablet Take 1 tablet by mouth daily.      oxyCODONE (OXY IR/ROXICODONE) 5 MG immediate release tablet Take 1-2 tablets (5-10 mg total) by mouth every 4 (four) hours as needed for moderate pain (pain score 4-6). (Patient not taking: Reported on 11/08/2021) 40 tablet 0 Not Taking   Syringe/Needle, Disp, (SYRINGE 3CC/21GX1") 21G X 1" 3 ML MISC Used to inject testosterone every 14 days 50 each 1    Syringe/Needle,  Disp, 25G X 1" 1 ML MISC Use as directed 25 each 0    Allergies  Allergen Reactions   Morphine And Related Anaphylaxis   Shellfish Allergy Anaphylaxis   Chlorhexidine Hives    Social History   Tobacco Use   Smoking status: Former    Packs/day: 1.50    Years: 25.00    Total pack years: 37.50    Types: Cigarettes    Quit date: 12/15/2017    Years since quitting: 3.9   Smokeless tobacco: Never  Substance Use Topics   Alcohol use: No    Family History  Problem Relation Age of Onset   Diabetes Mother  Hypertension Mother    Heart attack Mother    Osteoporosis Mother    Diabetes Father    Hypertension Father    Heart attack Father    Diabetes Sister    Hypertension Sister    Diabetes Brother    Hypertension Brother      Review of Systems  Musculoskeletal:  Positive for gait problem and joint swelling.  All other systems reviewed and are negative.   Objective:  Physical Exam Vitals reviewed.  Constitutional:      Appearance: Normal appearance. He is obese.  HENT:     Head: Normocephalic and atraumatic.  Eyes:     Extraocular Movements: Extraocular movements intact.     Pupils: Pupils are equal, round, and reactive to light.  Cardiovascular:     Rate and Rhythm: Normal rate and regular rhythm.     Pulses: Normal pulses.  Pulmonary:     Effort: Pulmonary effort is normal.     Breath sounds: Normal breath sounds.  Abdominal:     Palpations: Abdomen is soft.  Musculoskeletal:     Cervical back: Normal range of motion and neck supple.     Left knee: Effusion, bony tenderness and crepitus present. Decreased range of motion. Tenderness present over the medial joint line and lateral joint line. Abnormal alignment and abnormal meniscus.  Neurological:     Mental Status: He is alert and oriented to person, place, and time.  Psychiatric:        Behavior: Behavior normal.     Vital signs in last 24 hours:    Labs:   Estimated body mass index is 35.8 kg/m as  calculated from the following:   Height as of 11/12/21: 6' (1.829 m).   Weight as of 11/12/21: 119.7 kg.   Imaging Review Plain radiographs demonstrate severe degenerative joint disease of the left knee(s). The overall alignment ismild varus. The bone quality appears to be good for age and reported activity level.      Assessment/Plan:  End stage arthritis, left knee   The patient history, physical examination, clinical judgment of the provider and imaging studies are consistent with end stage degenerative joint disease of the left knee(s) and total knee arthroplasty is deemed medically necessary. The treatment options including medical management, injection therapy arthroscopy and arthroplasty were discussed at length. The risks and benefits of total knee arthroplasty were presented and reviewed. The risks due to aseptic loosening, infection, stiffness, patella tracking problems, thromboembolic complications and other imponderables were discussed. The patient acknowledged the explanation, agreed to proceed with the plan and consent was signed. Patient is being admitted for inpatient treatment for surgery, pain control, PT, OT, prophylactic antibiotics, VTE prophylaxis, progressive ambulation and ADL's and discharge planning. The patient is planning to be discharged home with home health services

## 2021-11-23 ENCOUNTER — Encounter (HOSPITAL_COMMUNITY): Payer: Self-pay | Admitting: Orthopaedic Surgery

## 2021-11-23 ENCOUNTER — Other Ambulatory Visit: Payer: Self-pay

## 2021-11-23 ENCOUNTER — Encounter (HOSPITAL_COMMUNITY)
Admission: RE | Disposition: A | Discharge status: Home / Self Care | Payer: Self-pay | Source: Home / Self Care | Attending: Orthopaedic Surgery

## 2021-11-23 ENCOUNTER — Ambulatory Visit (HOSPITAL_COMMUNITY): Payer: Medicare PPO | Admitting: Certified Registered"

## 2021-11-23 ENCOUNTER — Observation Stay (HOSPITAL_COMMUNITY)
Admission: RE | Admit: 2021-11-23 | Discharge: 2021-11-25 | Disposition: A | Discharge status: Home / Self Care | Payer: Medicare PPO | Attending: Orthopaedic Surgery | Admitting: Orthopaedic Surgery

## 2021-11-23 ENCOUNTER — Observation Stay (HOSPITAL_COMMUNITY): Payer: Medicare PPO

## 2021-11-23 ENCOUNTER — Ambulatory Visit (HOSPITAL_BASED_OUTPATIENT_CLINIC_OR_DEPARTMENT_OTHER): Payer: Medicare PPO | Admitting: Certified Registered"

## 2021-11-23 DIAGNOSIS — Z87891 Personal history of nicotine dependence: Secondary | ICD-10-CM | POA: Insufficient documentation

## 2021-11-23 DIAGNOSIS — J45909 Unspecified asthma, uncomplicated: Secondary | ICD-10-CM | POA: Insufficient documentation

## 2021-11-23 DIAGNOSIS — M1712 Unilateral primary osteoarthritis, left knee: Principal | ICD-10-CM | POA: Insufficient documentation

## 2021-11-23 DIAGNOSIS — E119 Type 2 diabetes mellitus without complications: Secondary | ICD-10-CM

## 2021-11-23 DIAGNOSIS — I10 Essential (primary) hypertension: Secondary | ICD-10-CM | POA: Insufficient documentation

## 2021-11-23 DIAGNOSIS — Z79899 Other long term (current) drug therapy: Secondary | ICD-10-CM | POA: Insufficient documentation

## 2021-11-23 DIAGNOSIS — Z96642 Presence of left artificial hip joint: Secondary | ICD-10-CM | POA: Insufficient documentation

## 2021-11-23 DIAGNOSIS — Z7984 Long term (current) use of oral hypoglycemic drugs: Secondary | ICD-10-CM

## 2021-11-23 DIAGNOSIS — Z96652 Presence of left artificial knee joint: Secondary | ICD-10-CM

## 2021-11-23 HISTORY — PX: TOTAL KNEE ARTHROPLASTY: SHX125

## 2021-11-23 LAB — GLUCOSE, CAPILLARY
Glucose-Capillary: 132 mg/dL — ABNORMAL HIGH (ref 70–99)
Glucose-Capillary: 128 mg/dL — ABNORMAL HIGH (ref 70–99)

## 2021-11-23 LAB — TYPE AND SCREEN
ABO/RH(D): A POS
Antibody Screen: NEGATIVE

## 2021-11-23 LAB — ABO/RH: ABO/RH(D): A POS

## 2021-11-23 SURGERY — ARTHROPLASTY, KNEE, TOTAL
Anesthesia: Spinal | Site: Knee | Laterality: Left

## 2021-11-23 MED ORDER — METHOCARBAMOL 500 MG PO TABS
500.0000 mg | ORAL_TABLET | Freq: Four times a day (QID) | ORAL | Status: DC | PRN
  Administered 2021-11-23 – 2021-11-25 (×5): 500 mg via ORAL
  Filled 2021-11-23 (×5): qty 1

## 2021-11-23 MED ORDER — HYDROXYZINE HCL 25 MG PO TABS
25.0000 mg | ORAL_TABLET | Freq: Every day | ORAL | Status: DC
  Administered 2021-11-23 – 2021-11-25 (×3): 25 mg via ORAL
  Filled 2021-11-23 (×3): qty 1

## 2021-11-23 MED ORDER — HYDROMORPHONE HCL 1 MG/ML IJ SOLN
0.5000 mg | INTRAMUSCULAR | Status: DC | PRN
  Administered 2021-11-23 – 2021-11-24 (×6): 1 mg via INTRAVENOUS
  Filled 2021-11-23 (×6): qty 1

## 2021-11-23 MED ORDER — TRANEXAMIC ACID-NACL 1000-0.7 MG/100ML-% IV SOLN
1000.0000 mg | INTRAVENOUS | Status: AC
  Administered 2021-11-23: 1000 mg via INTRAVENOUS
  Filled 2021-11-23: qty 100

## 2021-11-23 MED ORDER — MUPIROCIN 2 % EX OINT: 1.0000 | TOPICAL_OINTMENT | Freq: Once | CUTANEOUS | Status: DC

## 2021-11-23 MED ORDER — MIDAZOLAM HCL 2 MG/2ML IJ SOLN
1.0000 mg | INTRAMUSCULAR | Status: DC
  Administered 2021-11-23: 1 mg via INTRAVENOUS
  Filled 2021-11-23: qty 2

## 2021-11-23 MED ORDER — FLUTICASONE FUROATE-VILANTEROL 200-25 MCG/ACT IN AEPB
1.0000 | INHALATION_SPRAY | Freq: Every day | RESPIRATORY_TRACT | Status: DC
  Administered 2021-11-24 – 2021-11-25 (×2): 1 via RESPIRATORY_TRACT
  Filled 2021-11-23: qty 28

## 2021-11-23 MED ORDER — BUPIVACAINE-EPINEPHRINE (PF) 0.25% -1:200000 IJ SOLN
INTRAMUSCULAR | Status: AC
  Filled 2021-11-23: qty 30

## 2021-11-23 MED ORDER — LACTATED RINGERS IV SOLN: INTRAVENOUS | Status: DC

## 2021-11-23 MED ORDER — METOCLOPRAMIDE HCL 5 MG/ML IJ SOLN: 5.0000 mg | Freq: Three times a day (TID) | INTRAMUSCULAR | Status: DC | PRN

## 2021-11-23 MED ORDER — FENTANYL CITRATE PF 50 MCG/ML IJ SOSY
50.0000 ug | PREFILLED_SYRINGE | INTRAMUSCULAR | Status: DC
  Administered 2021-11-23: 50 ug via INTRAVENOUS
  Filled 2021-11-23: qty 2

## 2021-11-23 MED ORDER — PAROXETINE HCL 10 MG PO TABS
40.0000 mg | ORAL_TABLET | ORAL | Status: DC
  Administered 2021-11-24 – 2021-11-25 (×2): 40 mg via ORAL
  Filled 2021-11-23 (×2): qty 4

## 2021-11-23 MED ORDER — ACETAMINOPHEN 325 MG PO TABS
325.0000 mg | ORAL_TABLET | Freq: Four times a day (QID) | ORAL | Status: DC | PRN
  Administered 2021-11-24: 650 mg via ORAL
  Filled 2021-11-23: qty 2

## 2021-11-23 MED ORDER — PROPOFOL 1000 MG/100ML IV EMUL
INTRAVENOUS | Status: AC
  Filled 2021-11-23: qty 100

## 2021-11-23 MED ORDER — OLMESARTAN MEDOXOMIL-HCTZ 40-25 MG PO TABS
1.0000 | ORAL_TABLET | Freq: Every day | ORAL | Status: DC
Start: 2021-11-23 — End: 2021-11-23

## 2021-11-23 MED ORDER — SPIRONOLACTONE 25 MG PO TABS
25.0000 mg | ORAL_TABLET | Freq: Every day | ORAL | Status: DC
  Administered 2021-11-23 – 2021-11-24 (×2): 25 mg via ORAL
  Filled 2021-11-23 (×2): qty 1

## 2021-11-23 MED ORDER — IRBESARTAN 150 MG PO TABS
300.0000 mg | ORAL_TABLET | Freq: Every day | ORAL | Status: DC
  Administered 2021-11-23 – 2021-11-24 (×2): 300 mg via ORAL
  Filled 2021-11-23 (×2): qty 2

## 2021-11-23 MED ORDER — METHOCARBAMOL 500 MG IVPB - SIMPLE MED
500.0000 mg | Freq: Four times a day (QID) | INTRAVENOUS | Status: DC | PRN
  Administered 2021-11-23: 500 mg via INTRAVENOUS

## 2021-11-23 MED ORDER — GABAPENTIN 300 MG PO CAPS
300.0000 mg | ORAL_CAPSULE | Freq: Three times a day (TID) | ORAL | Status: DC | PRN
  Administered 2021-11-23 – 2021-11-24 (×4): 300 mg via ORAL
  Filled 2021-11-23 (×4): qty 1

## 2021-11-23 MED ORDER — PHENOL 1.4 % MT LIQD: 1.0000 | OROMUCOSAL | Status: DC | PRN

## 2021-11-23 MED ORDER — HYDRALAZINE HCL 10 MG PO TABS
10.0000 mg | ORAL_TABLET | Freq: Every day | ORAL | Status: DC
  Administered 2021-11-24 – 2021-11-25 (×2): 10 mg via ORAL
  Filled 2021-11-23 (×2): qty 1

## 2021-11-23 MED ORDER — PHENYLEPHRINE 80 MCG/ML (10ML) SYRINGE FOR IV PUSH (FOR BLOOD PRESSURE SUPPORT)
PREFILLED_SYRINGE | INTRAVENOUS | Status: DC | PRN
  Administered 2021-11-23 (×2): 80 ug via INTRAVENOUS

## 2021-11-23 MED ORDER — METHOCARBAMOL 500 MG IVPB - SIMPLE MED
INTRAVENOUS | Status: AC
  Filled 2021-11-23: qty 55

## 2021-11-23 MED ORDER — METFORMIN HCL 500 MG PO TABS
1000.0000 mg | ORAL_TABLET | Freq: Two times a day (BID) | ORAL | Status: DC
  Administered 2021-11-23 – 2021-11-25 (×4): 1000 mg via ORAL
  Filled 2021-11-23 (×4): qty 2

## 2021-11-23 MED ORDER — MENTHOL 3 MG MT LOZG: 1.0000 | LOZENGE | OROMUCOSAL | Status: DC | PRN

## 2021-11-23 MED ORDER — ONDANSETRON HCL 4 MG/2ML IJ SOLN: 4.0000 mg | Freq: Once | INTRAMUSCULAR | Status: DC | PRN

## 2021-11-23 MED ORDER — OXYCODONE HCL 5 MG PO TABS
5.0000 mg | ORAL_TABLET | ORAL | Status: DC | PRN
  Administered 2021-11-23 – 2021-11-24 (×3): 10 mg via ORAL
  Filled 2021-11-23 (×2): qty 2

## 2021-11-23 MED ORDER — ATORVASTATIN CALCIUM 40 MG PO TABS
40.0000 mg | ORAL_TABLET | Freq: Every day | ORAL | Status: DC
  Administered 2021-11-23 – 2021-11-25 (×3): 40 mg via ORAL
  Filled 2021-11-23 (×3): qty 1

## 2021-11-23 MED ORDER — DIVALPROEX SODIUM 250 MG PO DR TAB
500.0000 mg | DELAYED_RELEASE_TABLET | Freq: Three times a day (TID) | ORAL | Status: DC
  Administered 2021-11-23 – 2021-11-25 (×5): 500 mg via ORAL
  Filled 2021-11-23 (×6): qty 2

## 2021-11-23 MED ORDER — HYDROCHLOROTHIAZIDE 25 MG PO TABS
25.0000 mg | ORAL_TABLET | Freq: Every day | ORAL | Status: DC
  Administered 2021-11-24: 25 mg via ORAL
  Filled 2021-11-23: qty 1

## 2021-11-23 MED ORDER — CHLORHEXIDINE GLUCONATE 0.12 % MT SOLN: 15.0000 mL | Freq: Once | OROMUCOSAL | Status: DC

## 2021-11-23 MED ORDER — TAMSULOSIN HCL 0.4 MG PO CAPS
0.4000 mg | ORAL_CAPSULE | Freq: Every day | ORAL | Status: DC
  Administered 2021-11-24 – 2021-11-25 (×2): 0.4 mg via ORAL
  Filled 2021-11-23 (×2): qty 1

## 2021-11-23 MED ORDER — VERAPAMIL HCL 120 MG PO TABS
120.0000 mg | ORAL_TABLET | Freq: Every day | ORAL | Status: DC
  Administered 2021-11-24 – 2021-11-25 (×2): 120 mg via ORAL
  Filled 2021-11-23 (×2): qty 1

## 2021-11-23 MED ORDER — 0.9 % SODIUM CHLORIDE (POUR BTL) OPTIME
TOPICAL | Status: DC | PRN
  Administered 2021-11-23: 1000 mL

## 2021-11-23 MED ORDER — CEFAZOLIN SODIUM-DEXTROSE 2-4 GM/100ML-% IV SOLN
2.0000 g | INTRAVENOUS | Status: AC
  Administered 2021-11-23: 2 g via INTRAVENOUS
  Filled 2021-11-23: qty 100

## 2021-11-23 MED ORDER — PANTOPRAZOLE SODIUM 40 MG PO TBEC
40.0000 mg | DELAYED_RELEASE_TABLET | Freq: Every day | ORAL | Status: DC
  Administered 2021-11-23 – 2021-11-25 (×3): 40 mg via ORAL
  Filled 2021-11-23 (×3): qty 1

## 2021-11-23 MED ORDER — HYDROMORPHONE HCL 1 MG/ML IJ SOLN: 0.2500 mg | INTRAMUSCULAR | Status: DC | PRN

## 2021-11-23 MED ORDER — ASPIRIN 81 MG PO CHEW
81.0000 mg | CHEWABLE_TABLET | Freq: Two times a day (BID) | ORAL | Status: DC
  Administered 2021-11-23 – 2021-11-25 (×4): 81 mg via ORAL
  Filled 2021-11-23 (×4): qty 1

## 2021-11-23 MED ORDER — SODIUM CHLORIDE 0.9 % IV SOLN: INTRAVENOUS | Status: DC

## 2021-11-23 MED ORDER — DOCUSATE SODIUM 100 MG PO CAPS
100.0000 mg | ORAL_CAPSULE | Freq: Two times a day (BID) | ORAL | Status: DC
  Administered 2021-11-23 – 2021-11-25 (×4): 100 mg via ORAL
  Filled 2021-11-23 (×4): qty 1

## 2021-11-23 MED ORDER — ONDANSETRON HCL 4 MG PO TABS
4.0000 mg | ORAL_TABLET | Freq: Four times a day (QID) | ORAL | Status: DC | PRN
  Filled 2021-11-23: qty 1

## 2021-11-23 MED ORDER — BUPIVACAINE HCL (PF) 0.75 % IJ SOLN
INTRAMUSCULAR | Status: DC | PRN
  Administered 2021-11-23: 1.6 mg

## 2021-11-23 MED ORDER — ALUM & MAG HYDROXIDE-SIMETH 200-200-20 MG/5ML PO SUSP: 30.0000 mL | ORAL | Status: DC | PRN

## 2021-11-23 MED ORDER — CEFAZOLIN SODIUM-DEXTROSE 1-4 GM/50ML-% IV SOLN
1.0000 g | Freq: Four times a day (QID) | INTRAVENOUS | Status: AC
  Administered 2021-11-23 (×2): 1 g via INTRAVENOUS
  Filled 2021-11-23 (×2): qty 50

## 2021-11-23 MED ORDER — PROPOFOL 10 MG/ML IV BOLUS
INTRAVENOUS | Status: AC
Start: 2021-11-23 — End: ?
  Filled 2021-11-23: qty 20

## 2021-11-23 MED ORDER — PROPOFOL 500 MG/50ML IV EMUL
INTRAVENOUS | Status: DC | PRN
  Administered 2021-11-23: 100 ug/kg/min via INTRAVENOUS

## 2021-11-23 MED ORDER — FUROSEMIDE 40 MG PO TABS
40.0000 mg | ORAL_TABLET | Freq: Every day | ORAL | Status: DC
  Administered 2021-11-24 – 2021-11-25 (×2): 40 mg via ORAL
  Filled 2021-11-23 (×2): qty 1

## 2021-11-23 MED ORDER — POVIDONE-IODINE 10 % EX SWAB: 2.0000 | Freq: Once | CUTANEOUS | Status: DC

## 2021-11-23 MED ORDER — ZOLPIDEM TARTRATE 5 MG PO TABS: 5.0000 mg | ORAL_TABLET | Freq: Every evening | ORAL | Status: DC | PRN

## 2021-11-23 MED ORDER — HYDROMORPHONE HCL 2 MG PO TABS
2.0000 mg | ORAL_TABLET | ORAL | Status: DC | PRN
  Administered 2021-11-24: 2 mg via ORAL
  Filled 2021-11-23: qty 1

## 2021-11-23 MED ORDER — LIDOCAINE HCL (PF) 2 % IJ SOLN
INTRAMUSCULAR | Status: AC
  Filled 2021-11-23: qty 5

## 2021-11-23 MED ORDER — BUSPIRONE HCL 5 MG PO TABS
15.0000 mg | ORAL_TABLET | Freq: Two times a day (BID) | ORAL | Status: DC
  Administered 2021-11-23 – 2021-11-25 (×4): 15 mg via ORAL
  Filled 2021-11-23 (×4): qty 1

## 2021-11-23 MED ORDER — ONDANSETRON HCL 4 MG/2ML IJ SOLN: 4.0000 mg | Freq: Four times a day (QID) | INTRAMUSCULAR | Status: DC | PRN

## 2021-11-23 MED ORDER — ORAL CARE MOUTH RINSE
15.0000 mL | Freq: Once | OROMUCOSAL | Status: AC
  Administered 2021-11-23: 15 mL via OROMUCOSAL

## 2021-11-23 MED ORDER — ALBUTEROL SULFATE (2.5 MG/3ML) 0.083% IN NEBU: 2.5000 mg | INHALATION_SOLUTION | Freq: Four times a day (QID) | RESPIRATORY_TRACT | Status: DC | PRN

## 2021-11-23 MED ORDER — LISINOPRIL 10 MG PO TABS
10.0000 mg | ORAL_TABLET | Freq: Every day | ORAL | Status: DC
  Administered 2021-11-23 – 2021-11-24 (×2): 10 mg via ORAL
  Filled 2021-11-23 (×2): qty 1

## 2021-11-23 MED ORDER — ROPIVACAINE HCL 5 MG/ML IJ SOLN
INTRAMUSCULAR | Status: DC | PRN
  Administered 2021-11-23: 20 mL via PERINEURAL

## 2021-11-23 MED ORDER — METOCLOPRAMIDE HCL 5 MG PO TABS
5.0000 mg | ORAL_TABLET | Freq: Three times a day (TID) | ORAL | Status: DC | PRN
  Filled 2021-11-23: qty 2

## 2021-11-23 MED ORDER — MONTELUKAST SODIUM 10 MG PO TABS
10.0000 mg | ORAL_TABLET | Freq: Every day | ORAL | Status: DC
  Administered 2021-11-23 – 2021-11-24 (×2): 10 mg via ORAL
  Filled 2021-11-23 (×2): qty 1

## 2021-11-23 MED ORDER — OXYCODONE HCL 5 MG PO TABS
ORAL_TABLET | ORAL | Status: AC
  Filled 2021-11-23: qty 2

## 2021-11-23 MED ORDER — BUPIVACAINE-EPINEPHRINE 0.25% -1:200000 IJ SOLN
INTRAMUSCULAR | Status: DC | PRN
  Administered 2021-11-23: 30 mL

## 2021-11-23 MED ORDER — OXYCODONE HCL 5 MG/5ML PO SOLN: 5.0000 mg | Freq: Once | ORAL | Status: DC | PRN

## 2021-11-23 MED ORDER — ALBUTEROL SULFATE HFA 108 (90 BASE) MCG/ACT IN AERS: 2.0000 | INHALATION_SPRAY | Freq: Four times a day (QID) | RESPIRATORY_TRACT | Status: DC | PRN

## 2021-11-23 MED ORDER — MUPIROCIN 2 % EX OINT
1.0000 | TOPICAL_OINTMENT | Freq: Two times a day (BID) | CUTANEOUS | Status: DC
  Administered 2021-11-23: 1 via TOPICAL
  Filled 2021-11-23: qty 22

## 2021-11-23 MED ORDER — OXYCODONE HCL 5 MG PO TABS: 5.0000 mg | ORAL_TABLET | Freq: Once | ORAL | Status: DC | PRN

## 2021-11-23 MED ORDER — SODIUM CHLORIDE 0.9 % IR SOLN
Status: DC | PRN
  Administered 2021-11-23: 1000 mL

## 2021-11-23 SURGICAL SUPPLY — 58 items
ABDOMINAL PAD ABD IMPLANT
BAG COUNTER SPONGE SURGICOUNT (BAG) IMPLANT
BAG ZIPLOCK 12X15 (MISCELLANEOUS) ×1 IMPLANT
BENZOIN TINCTURE PRP APPL 2/3 (GAUZE/BANDAGES/DRESSINGS) IMPLANT
BLADE SAG 18X100X1.27 (BLADE) ×1 IMPLANT
BLADE SURG SZ10 CARB STEEL (BLADE) ×2 IMPLANT
BNDG ELASTIC 6X5.8 VLCR STR LF (GAUZE/BANDAGES/DRESSINGS) ×2 IMPLANT
BOWL SMART MIX CTS (DISPOSABLE) IMPLANT
COMP FEM KNEE STD PS 9 LT (Joint) ×1 IMPLANT
COMP PATELLAR 10X35 METAL (Joint) ×1 IMPLANT
COMP TIB KNEE PS 0D LT (Joint) ×1 IMPLANT
COMPONENT FEM KNEE STD PS 9 LT (Joint) IMPLANT
COMPONENT PATELLAR 10X35 METAL (Joint) IMPLANT
COMPONENT TIB KNEE PS 0D LT (Joint) IMPLANT
COOLER ICEMAN CLASSIC (MISCELLANEOUS) ×1 IMPLANT
COVER SURGICAL LIGHT HANDLE (MISCELLANEOUS) ×1 IMPLANT
CUFF TOURN SGL QUICK 34 (TOURNIQUET CUFF) ×1
CUFF TRNQT CYL 34X4.125X (TOURNIQUET CUFF) ×1 IMPLANT
DRAPE INCISE IOBAN 66X45 STRL (DRAPES) ×1 IMPLANT
DRAPE U-SHAPE 47X51 STRL (DRAPES) ×1 IMPLANT
DURAPREP 26ML APPLICATOR (WOUND CARE) ×1 IMPLANT
ELECT BLADE TIP CTD 4 INCH (ELECTRODE) ×1 IMPLANT
ELECT REM PT RETURN 15FT ADLT (MISCELLANEOUS) ×1 IMPLANT
GAUZE PAD ABD 8X10 STRL (GAUZE/BANDAGES/DRESSINGS) ×2 IMPLANT
GAUZE SPONGE 4X4 12PLY STRL (GAUZE/BANDAGES/DRESSINGS) ×1 IMPLANT
GAUZE XEROFORM 1X8 LF (GAUZE/BANDAGES/DRESSINGS) IMPLANT
GLOVE BIO SURGEON STRL SZ7.5 (GLOVE) ×1 IMPLANT
GLOVE BIOGEL PI IND STRL 8 (GLOVE) ×2 IMPLANT
GLOVE ECLIPSE 8.0 STRL XLNG CF (GLOVE) ×1 IMPLANT
GOWN STRL REUS W/ TWL XL LVL3 (GOWN DISPOSABLE) ×2 IMPLANT
GOWN STRL REUS W/TWL XL LVL3 (GOWN DISPOSABLE) ×2
HANDPIECE INTERPULSE COAX TIP (DISPOSABLE) ×1
HDLS TROCR DRIL PIN KNEE 75 (PIN) ×1
HOLDER FOLEY CATH W/STRAP (MISCELLANEOUS) IMPLANT
IMMOBILIZER KNEE 20 (SOFTGOODS) ×1
IMMOBILIZER KNEE 20 THIGH 36 (SOFTGOODS) ×1 IMPLANT
KIT TURNOVER KIT A (KITS) IMPLANT
NS IRRIG 1000ML POUR BTL (IV SOLUTION) ×1 IMPLANT
PACK TOTAL KNEE CUSTOM (KITS) ×1 IMPLANT
PAD COLD SHLDR WRAP-ON (PAD) ×1 IMPLANT
PADDING CAST COTTON 6X4 STRL (CAST SUPPLIES) ×2 IMPLANT
PIN DRILL HDLS TROCAR 75 4PK (PIN) IMPLANT
PROTECTOR NERVE ULNAR (MISCELLANEOUS) ×1 IMPLANT
SCREW FEMALE HEX FIX 25X2.5 (ORTHOPEDIC DISPOSABLE SUPPLIES) IMPLANT
SET HNDPC FAN SPRY TIP SCT (DISPOSABLE) ×1 IMPLANT
SET PAD KNEE POSITIONER (MISCELLANEOUS) ×1 IMPLANT
SPIKE FLUID TRANSFER (MISCELLANEOUS) IMPLANT
STAPLER VISISTAT 35W (STAPLE) IMPLANT
STEM ARTISURF EF 12 SZ8-11 (Stem) IMPLANT
STRIP CLOSURE SKIN 1/2X4 (GAUZE/BANDAGES/DRESSINGS) IMPLANT
SUT MNCRL AB 4-0 PS2 18 (SUTURE) IMPLANT
SUT VIC AB 0 CT1 27 (SUTURE) ×1
SUT VIC AB 0 CT1 27XBRD ANTBC (SUTURE) ×1 IMPLANT
SUT VIC AB 1 CT1 36 (SUTURE) ×2 IMPLANT
SUT VIC AB 2-0 CT1 27 (SUTURE) ×2
SUT VIC AB 2-0 CT1 TAPERPNT 27 (SUTURE) ×2 IMPLANT
TRAY FOLEY MTR SLVR 16FR STAT (SET/KITS/TRAYS/PACK) IMPLANT
WATER STERILE IRR 1000ML POUR (IV SOLUTION) ×2 IMPLANT

## 2021-11-23 NOTE — Anesthesia Procedure Notes (Signed)
Anesthesia Regional Block: Adductor canal block   Pre-Anesthetic Checklist: , timeout performed,  Correct Patient, Correct Site, Correct Laterality,  Correct Procedure, Correct Position, site marked,  Risks and benefits discussed,  Surgical consent,  Pre-op evaluation,  At surgeon's request and post-op pain management  Laterality: Left  Prep: chloraprep       Needles:  Injection technique: Single-shot  Needle Type: Echogenic Needle     Needle Length: 9cm      Additional Needles:   Procedures:,,,, ultrasound used (permanent image in chart),,    Narrative:  Start time: 11/23/2021 7:52 AM End time: 11/23/2021 7:59 AM Injection made incrementally with aspirations every 5 mL.  Performed by: Personally  Anesthesiologist: Eilene Ghazi, MD  Additional Notes: Patient tolerated the procedure well without complications

## 2021-11-23 NOTE — Anesthesia Procedure Notes (Signed)
Anesthesia Procedure Image    

## 2021-11-23 NOTE — Anesthesia Preprocedure Evaluation (Addendum)
Anesthesia Evaluation  Patient identified by MRN, date of birth, ID band Patient awake    Reviewed: Allergy & Precautions, NPO status , Patient's Chart, lab work & pertinent test results  Airway Mallampati: III  TM Distance: <3 FB Neck ROM: Full    Dental no notable dental hx.    Pulmonary asthma , Patient abstained from smoking., former smoker,    Pulmonary exam normal breath sounds clear to auscultation       Cardiovascular hypertension, Pt. on medications Normal cardiovascular exam Rhythm:Regular Rate:Normal     Neuro/Psych negative neurological ROS  negative psych ROS   GI/Hepatic negative GI ROS, Neg liver ROS,   Endo/Other  diabetes, Type obesity  Renal/GU negative Renal ROS  negative genitourinary   Musculoskeletal negative musculoskeletal ROS (+)   Abdominal   Peds negative pediatric ROS (+)  Hematology negative hematology ROS (+)   Anesthesia Other Findings   Reproductive/Obstetrics negative OB ROS                             Anesthesia Physical Anesthesia Plan  ASA: 3  Anesthesia Plan: Spinal   Post-op Pain Management: Regional block*   Induction: Intravenous  PONV Risk Score and Plan: 1 and Propofol infusion and Treatment may vary due to age or medical condition  Airway Management Planned: Simple Face Mask  Additional Equipment:   Intra-op Plan:   Post-operative Plan:   Informed Consent: I have reviewed the patients History and Physical, chart, labs and discussed the procedure including the risks, benefits and alternatives for the proposed anesthesia with the patient or authorized representative who has indicated his/her understanding and acceptance.     Dental advisory given  Plan Discussed with: CRNA and Surgeon  Anesthesia Plan Comments:         Anesthesia Quick Evaluation

## 2021-11-23 NOTE — Op Note (Signed)
Operative Note  Date of Surgery:  11/23/2021 Preoperative diagnosis: Left knee osteoarthritis and degenerative joint disease Postoperative diagnosis: Same  Procedure: Left press-fit total knee arthroplasty.  Implants: Biomet/Zimmer persona press-fit knee system with size 9 CR standard femur, size F left tibial tray, 12 mm thickness medial congruent left polyethylene insert, 35 mm patella button  Surgeon: Vanita Panda. Magnus Ivan, MD Assistant: Rexene Edison, PA-C  Anesthesia: #1 left lower extremity adductor canal block          #2 spinal          #3 local EBL: Less than 100 cc Tourniquet time: Less than 1 hour Complications: None  Indications: The patient is an active 56 year old gentleman with well-documented severe osteoarthritis of his left knee.  We have tried and failed conservative treatment for over 12 months.  This includes anti-inflammatories and injections in his knee with steroid.  He had a remote history of arthroscopic surgery on the knee.  At this point his left knee pain is daily.  His x-rays show bone-on-bone wear with varus malalignment and osteophytes in all 3 compartments.  At this point he has received a total knee arthroplasty given his daily pain and the detrimental effect this is having on his mobility, his quality of life and his actives daily living.  We talked about the risk of acute blood loss anemia, nerve vessel injury, fracture, infection, DVT, implant failure and soft tissue healing issues.  We talked about her goals being decreased pain, improve mobility, and improve quality of life.  Procedure description after informed consent was obtained the appropriate left knee was marked, a regional block was obtained of the left lower extremity arthrogram.  He was then brought to the operating room and set up on the operative table where spinal anesthesia was obtained.  He was then laid in supine position a Foley catheter was placed.  A nonsterile tourniquet placed around  his upper left thigh and his left thigh, knee, leg, and ankle were prepped and draped with DuraPrep and sterile drapes including a sterile stockinette.  A timeout was called and he was identified as correct patient and the correct left knee.  We then used Esmarch wrap of the leg and tourniquet was plated to 3 mm of pressure.  We then made a direct midline incision over the patella and carried this proximally distally.  I dissected down to the knee joint carried out a medial parapatellar arthrotomy finding a very large joint effusion and significant synovitis in the knee.  There were large osteophytes in all 3 compartments.  With the knee in a flexed position we removed synovium from the knee that was inflamed as well as osteophytes in all 3 compartments.  Removed the ACL as well as medial lateral meniscus.  We then used extramedullary cutting guide for making her proximal tibia cut correction for varus and valgus and 3 degree slope we set this cut to take 2 mm off the low side.  We then went to the distal femur using a distal femoral cutting guide for left knee at 5 degrees external rotated we made a 10 mm distal femoral cut.  With the knee in full extension and 10 mm extension block left and just slightly hyperextended.  We will back to the femur and put a femoral sizing guide based off the epicondylar axis with the knee in a flexed position.  Based off of this we chose a size 9 femur.  We put a 4-in-1 cutting block for a  size 9 femur and made her anterior and posterior cuts followed our chamfer cuts.  We then went to the tibia and chose a size F left tibial tray for coverage over the tibial plateau.  We set the rotation of the tibial tubercle and the femur.  We then did our drill punch and drill holes off of this.  With this I hit of note we found him to have incredibly good quality bone.  We decided to proceed with press-fit implants.  We then trialed a size F left tibial tray followed by our size the left 9  femur which was a standard femur.  We went with a 10 mm medial congruent polyethylene insert and decided to go up to 12 mm insert and this will give [place] good range of motion and stability.  We then made our patella cut at 10 mm and chose a size 35 press-fit patella which we drilled a hole for.  We then removed all instrumentation from the knee and irrigate the knee with normal saline solution using pulsatile lavage.  We placed Marcaine with epinephrine around the arthrotomy and then dried the knee real well.  With the knee in a flexed position we then press-fit our size F left Biomet/Zimmer persona tibial tray followed by press fitting our size 9 left standard femur.  We press-fit our size 35 patella button.  We then put the knee through several cycles of motionwith range of motion and stability.  We then let the tourniquet down hemostasis was obtained electrocautery.  We closed the arthrotomy with interrupted #1 Vicryl suture followed by 0 Vicryl to close the deep tissue and 2-0 Vicryl to close the subcutaneous tissue.  The skin was closed with staples.  Well-padded sterile dressings applied.  The patient was taken recovery room in stable condition with all final counts being correct and no complications noted.  Rexene Edison, PA-C assisted in entire case from beginning to end and assistance medically necessary and crucial for soft tissue management and retraction as well as helping guide implant placement and a layered closure of the wound.

## 2021-11-23 NOTE — Anesthesia Procedure Notes (Addendum)
Spinal  Patient location during procedure: OR Start time: 11/23/2021 8:30 AM End time: 11/23/2021 8:34 AM Reason for block: surgical anesthesia Staffing Performed: resident/CRNA  Resident/CRNA: Lieutenant Diego, CRNA Performed by: Eulas Post, Cailynn Bodnar W, CRNA Authorized by: Myrtie Soman, MD   Preanesthetic Checklist Completed: patient identified, IV checked, site marked, risks and benefits discussed, surgical consent, monitors and equipment checked, pre-op evaluation and timeout performed Spinal Block Patient position: sitting Prep: DuraPrep Patient monitoring: heart rate, cardiac monitor, continuous pulse ox and blood pressure Approach: midline Location: L3-4 Injection technique: single-shot Needle Needle type: Pencan  Needle gauge: 24 G Needle length: 10 cm Assessment Sensory level: T4 Events: CSF return Additional Notes Pt placed in sitting position for spinal L3-4 located. Spinal kit expiration date checked. Sterile prep and drape of back. Skin anesthestized at site. One attempt. - heme +clear CSF obtained. Pt tolerated well and placed supine after spinal placement

## 2021-11-23 NOTE — Evaluation (Signed)
Physical Therapy Evaluation Patient Details Name: Frank [name]. MRN: [id number] DOB: February 23, 1966 Today's Date: 11/23/2021  History of Present Illness  Pt s/p L TKR and with hx of DM, obesity, and L THR (19)  Clinical Impression  Pt s/p L TKR and presents with decreased L LE strength/ROM and post op pain limiting functional mobility.  Pt should progress to dc home with family assist and reports will have HHPT follow up.  Pt will need RW for home use     Recommendations for follow up therapy are one component of a multi-disciplinary discharge planning process, led by the attending physician.  Recommendations may be updated based on patient status, additional functional criteria and insurance authorization.  Follow Up Recommendations Follow physician's recommendations for discharge plan and follow up therapies      Assistance Recommended at Discharge Intermittent Supervision/Assistance  Patient can return home with the following  A little help with walking and/or transfers;A little help with bathing/dressing/bathroom;Assistance with cooking/housework;Assist for transportation;Help with stairs or ramp for entrance    Equipment Recommendations Rolling walker (2 wheels)  Recommendations for Other Services       Functional Status Assessment Patient has had a recent decline in their functional status and demonstrates the ability to make significant improvements in function in a reasonable and predictable amount of time.     Precautions / Restrictions Precautions Precautions: Knee Required Braces or Orthoses: Knee Immobilizer - Left Knee Immobilizer - Left: Discontinue once straight leg raise with < 10 degree lag Restrictions Weight Bearing Restrictions: No Other Position/Activity Restrictions: WBAT      Mobility  Bed Mobility Overal bed mobility: Needs Assistance Bed Mobility: Supine to Sit     Supine to sit: Min assist     General bed mobility comments: Increased time with  assist for L LE    Transfers Overall transfer level: Needs assistance Equipment used: Rolling walker (2 wheels) Transfers: Sit to/from Stand Sit to Stand: Min assist           General transfer comment: cues for LE management and use of UEs to self assist    Ambulation/Gait Ambulation/Gait assistance: Min assist Gait Distance (Feet): 48 Feet Assistive device: Rolling walker (2 wheels) Gait Pattern/deviations: Step-to pattern, Decreased step length - right, Decreased step length - left, Shuffle, Trunk flexed Gait velocity: decr     General Gait Details: cues for sequence, posture and position from [employer]            Wheelchair Mobility    Modified Rankin (Stroke Patients Only)       Balance Overall balance assessment: Needs assistance Sitting-balance support: No upper extremity supported, Feet supported Sitting balance-Leahy Scale: Good     Standing balance support: Bilateral upper extremity supported Standing balance-Leahy Scale: Poor                               Pertinent Vitals/Pain Pain Assessment Pain Assessment: 0-10 Pain Score: 6  Pain Location: L knee Pain Descriptors / Indicators: Aching, Sore Pain Intervention(s): Limited activity within patient's tolerance, Monitored during session, Premedicated before session, Ice applied    Home Living Family/patient expects to be discharged to:: Private residence Living Arrangements: Spouse/significant other Available Help at Discharge: Family Type of Home: House Home Access: Level entry       Home Layout: One level Home Equipment: Cane - single point;BSC/3in1      Prior Function Prior Level of Function :  Independent/Modified Independent             Mobility Comments: using cane       Hand Dominance        Extremity/Trunk Assessment   Upper Extremity Assessment Upper Extremity Assessment: Overall WFL for tasks assessed    Lower Extremity Assessment Lower  Extremity Assessment: LLE deficits/detail    Cervical / Trunk Assessment Cervical / Trunk Assessment: Normal  Communication   Communication: No difficulties  Cognition Arousal/Alertness: Awake/alert Behavior During Therapy: WFL for tasks assessed/performed Overall Cognitive Status: Within Functional Limits for tasks assessed                                          General Comments      Exercises Total Joint Exercises Ankle Circles/Pumps: AROM, Both, 15 reps, Supine   Assessment/Plan    PT Assessment Patient needs continued PT services  PT Problem List Decreased strength;Decreased range of motion;Decreased activity tolerance;Decreased balance;Decreased mobility;Decreased knowledge of use of DME;Pain       PT Treatment Interventions DME instruction;Gait training;Stair training;Functional mobility training;Therapeutic activities;Therapeutic exercise;Patient/family education    PT Goals (Current goals can be found in the Care Plan section)  Acute Rehab PT Goals Patient Stated Goal: Regain IND PT Goal Formulation: With patient Time For Goal Achievement: 11/30/21 Potential to Achieve Goals: Good    Frequency 7X/week     Co-evaluation               AM-PAC PT "6 Clicks" Mobility  Outcome Measure Help needed turning from your back to your side while in a flat bed without using bedrails?: A Little Help needed moving from lying on your back to sitting on the side of a flat bed without using bedrails?: A Little Help needed moving to and from a bed to a chair (including a wheelchair)?: A Little Help needed standing up from a chair using your arms (e.g., wheelchair or bedside chair)?: A Little Help needed to walk in hospital room?: A Little Help needed climbing 3-5 steps with a railing? : A Lot 6 Click Score: 17    End of Session Equipment Utilized During Treatment: Gait belt;Left knee immobilizer Activity Tolerance: Patient tolerated treatment  well Patient left: in chair;with call bell/phone within reach;with chair alarm set;with family/visitor present Nurse Communication: Mobility status PT Visit Diagnosis: Difficulty in walking, not elsewhere classified (R26.2)    Time: 9390-3009 PT Time Calculation (min) (ACUTE ONLY): 25 min   Charges:   PT Evaluation $PT Eval Low Complexity: 1 Low PT Treatments $Gait Training: 8-22 mins        Mauro Kaufmann PT Acute Rehabilitation Services Pager [phone] Office [phone]   Burnis Kaser 11/23/2021, 5:42 PM

## 2021-11-23 NOTE — Interval H&P Note (Signed)
History and Physical Interval Note: The patient understands that he is here today for a left total knee replacement to treat his severe left knee arthritis.  There has been no acute or interval change in his medical status.  See recent H&P.  The risks and benefits of surgery been explained in detail and informed consent is obtained.  The left knee has been marked.  11/23/2021 7:02 AM  Frank [name].  has presented today for surgery, with the diagnosis of OSTEOARTHRITIS LEFT KNEE.  The various methods of treatment have been discussed with the patient and family. After consideration of risks, benefits and other options for treatment, the patient has consented to  Procedure(s): LEFT TOTAL KNEE ARTHROPLASTY (Left) as a surgical intervention.  The patient's history has been reviewed, patient examined, no change in status, stable for surgery.  I have reviewed the patient's chart and labs.  Questions were answered to the patient's satisfaction.     Kathryne Hitch

## 2021-11-23 NOTE — Anesthesia Postprocedure Evaluation (Signed)
Anesthesia Post Note  Patient: Frank [name].  Procedure(s) Performed: LEFT TOTAL KNEE ARTHROPLASTY (Left: Knee)     Patient location during evaluation: PACU Anesthesia Type: Spinal Level of consciousness: oriented and awake and alert Pain management: pain level controlled Vital Signs Assessment: post-procedure vital signs reviewed and stable Respiratory status: spontaneous breathing, respiratory function stable and patient connected to nasal cannula oxygen Cardiovascular status: blood pressure returned to baseline and stable Postop Assessment: no headache, no backache and no apparent nausea or vomiting Anesthetic complications: no   No notable events documented.  Last Vitals:  Vitals:   11/23/21 1045 11/23/21 1100  BP: 105/76 (!) 126/91  Pulse: 76 79  Resp: 17 20  Temp:  36.7 C  SpO2: 90% 95%    Last Pain:  Vitals:   11/23/21 1100  TempSrc:   PainSc: 0-No pain                 Noura Purpura S

## 2021-11-23 NOTE — Addendum Note (Signed)
Addendum  created 11/23/21 1157 by Caren Macadam, CRNA   Charge Capture section accepted, Visit diagnoses modified

## 2021-11-23 NOTE — Transfer of Care (Signed)
Immediate Anesthesia Transfer of Care Note  Patient: Frank [name].  Procedure(s) Performed: LEFT TOTAL KNEE ARTHROPLASTY (Left: Knee)  Patient Location: PACU  Anesthesia Type:Regional and Spinal  Level of Consciousness: awake  Airway & Oxygen Therapy: Patient Spontanous Breathing and Patient connected to face mask oxygen  Post-op Assessment: Report given to RN and Post -op Vital signs reviewed and stable  Post vital signs: Reviewed and stable  Last Vitals:  Vitals Value Taken Time  BP    Temp    Pulse    Resp 17 11/23/21 1017  SpO2    Vitals shown include unvalidated device data.  Last Pain:  Vitals:   11/23/21 0655  TempSrc:   PainSc: 5       Patients Stated Pain Goal: 5 (11/23/21 0655)  Complications: No notable events documented.

## 2021-11-23 NOTE — Addendum Note (Signed)
Addendum  created 11/23/21 1155 by Caren Macadam, CRNA   Intraprocedure Meds edited

## 2021-11-23 NOTE — Anesthesia Procedure Notes (Signed)
Procedure Name: MAC Date/Time: 11/23/2021 8:30 AM  Performed by: Lieutenant Diego, CRNAPre-anesthesia Checklist: Patient identified, Emergency Drugs available, Suction available, Patient being monitored and Timeout performed Patient Re-evaluated:Patient Re-evaluated prior to induction Oxygen Delivery Method: Simple face mask Preoxygenation: Pre-oxygenation with 100% oxygen Induction Type: IV induction

## 2021-11-24 DIAGNOSIS — M1712 Unilateral primary osteoarthritis, left knee: Secondary | ICD-10-CM | POA: Diagnosis not present

## 2021-11-24 LAB — CBC
HCT: 37.8 % — ABNORMAL LOW (ref 39.0–52.0)
Hemoglobin: 11.9 g/dL — ABNORMAL LOW (ref 13.0–17.0)
MCH: 27.7 pg (ref 26.0–34.0)
MCHC: 31.5 g/dL (ref 30.0–36.0)
MCV: 88.1 fL (ref 80.0–100.0)
Platelets: 285 10*3/uL (ref 150–400)
RBC: 4.29 MIL/uL (ref 4.22–5.81)
RDW: 15.2 % (ref 11.5–15.5)
WBC: 11.9 10*3/uL — ABNORMAL HIGH (ref 4.0–10.5)
nRBC: 0 % (ref 0.0–0.2)

## 2021-11-24 LAB — BASIC METABOLIC PANEL
Anion gap: 7 (ref 5–15)
BUN: 16 mg/dL (ref 6–20)
CO2: 26 mmol/L (ref 22–32)
Calcium: 8.5 mg/dL — ABNORMAL LOW (ref 8.9–10.3)
Chloride: 104 mmol/L (ref 98–111)
Creatinine, Ser: 0.93 mg/dL (ref 0.61–1.24)
GFR, Estimated: >60 mL/min (ref >60)
Glucose, Bld: 153 mg/dL — ABNORMAL HIGH (ref 70–99)
Potassium: 4.5 mmol/L (ref 3.5–5.1)
Sodium: 137 mmol/L (ref 135–145)

## 2021-11-24 MED ORDER — HYDROMORPHONE HCL 2 MG PO TABS
2.0000 mg | ORAL_TABLET | ORAL | Status: DC | PRN
  Administered 2021-11-24 – 2021-11-25 (×4): 4 mg via ORAL
  Filled 2021-11-24 (×4): qty 2

## 2021-11-24 MED ORDER — AMLODIPINE BESYLATE 10 MG PO TABS
10.0000 mg | ORAL_TABLET | Freq: Every day | ORAL | Status: DC
  Administered 2021-11-25: 10 mg via ORAL
  Filled 2021-11-24: qty 1

## 2021-11-24 MED ORDER — HYDROMORPHONE HCL 4 MG PO TABS: 4.0000 mg | ORAL_TABLET | ORAL | 0 refills | Status: DC | PRN

## 2021-11-24 MED ORDER — TIZANIDINE HCL 4 MG PO TABS: 4.0000 mg | ORAL_TABLET | Freq: Three times a day (TID) | ORAL | 0 refills | Status: DC | PRN

## 2021-11-24 MED ORDER — ASPIRIN 81 MG PO CHEW: 81.0000 mg | CHEWABLE_TABLET | Freq: Two times a day (BID) | ORAL | 1 refills | Status: DC

## 2021-11-24 MED ORDER — HYDROCHLOROTHIAZIDE 25 MG PO TABS
25.0000 mg | ORAL_TABLET | Freq: Every day | ORAL | Status: DC
  Administered 2021-11-25: 25 mg via ORAL
  Filled 2021-11-24: qty 1

## 2021-11-24 MED ORDER — IRBESARTAN 150 MG PO TABS
300.0000 mg | ORAL_TABLET | Freq: Every day | ORAL | Status: DC
  Administered 2021-11-25: 300 mg via ORAL
  Filled 2021-11-24: qty 2

## 2021-11-24 MED ORDER — KETOROLAC TROMETHAMINE 15 MG/ML IJ SOLN
15.0000 mg | Freq: Four times a day (QID) | INTRAMUSCULAR | Status: AC
  Administered 2021-11-24 – 2021-11-25 (×3): 15 mg via INTRAVENOUS
  Filled 2021-11-24 (×3): qty 1

## 2021-11-24 NOTE — Progress Notes (Signed)
Subjective: 1 Day Post-Op Procedure(s) (LRB): LEFT TOTAL KNEE ARTHROPLASTY (Left) Patient reports pain as severe.  Labs and vitals stable.  Objective: Vital signs in last 24 hours: Temp:  [97.5 F (36.4 C)-98.9 F (37.2 C)] 97.5 F (36.4 C) (09/09 0926) Pulse Rate:  [69-86] 84 (09/09 0926) Resp:  [17-20] 18 (09/09 0926) BP: (100-158)/(57-119) 129/106 (09/09 0926) SpO2:  [90 %-99 %] 97 % (09/09 0926)  Intake/Output from previous day: 09/08 0701 - 09/09 0700 In: 2068.3 [P.O.:720; I.V.:1043.3; IV Piggyback:305] Out: 2476 [Urine:2376; Blood:100] Intake/Output this shift: Total I/O In: -  Out: 100 [Urine:100]  Recent Labs    11/24/21 0327  HGB 11.9*   Recent Labs    11/24/21 0327  WBC 11.9*  RBC 4.29  HCT 37.8*  PLT 285   Recent Labs    11/24/21 0327  NA 137  K 4.5  CL 104  CO2 26  BUN 16  CREATININE 0.93  GLUCOSE 153*  CALCIUM 8.5*   No results for input(s): "LABPT", "INR" in the last 72 hours.  Sensation intact distally Intact pulses distally Dorsiflexion/Plantar flexion intact Incision: dressing C/D/I Compartment soft   Assessment/Plan: 1 Day Post-Op Procedure(s) (LRB): LEFT TOTAL KNEE ARTHROPLASTY (Left) Up with therapy Plan for discharge tomorrow Discharge home with home health      Frank [name] 11/24/2021, 10:07 AM

## 2021-11-24 NOTE — Progress Notes (Signed)
Physical Therapy Treatment Patient Details Name: Frank [name]. MRN: [id number] DOB: [birth year] Today's Date: 11/24/2021   History of Present Illness Pt s/p L TKR and with hx of DM, obesity, and L THR (19)    PT Comments    Pt very cooperative but pain limited despite premed with oral meds.  Pt up to ambulate 42 ft with RW but requiring increased time, multiple standing rest breaks and tolerating min WB on L LE to complete task.  Pt states willing to attempt gentle ROM but will follow up with same after next pain meds.   Recommendations for follow up therapy are one component of a multi-disciplinary discharge planning process, led by the attending physician.  Recommendations may be updated based on patient status, additional functional criteria and insurance authorization.  Follow Up Recommendations  Follow physician's recommendations for discharge plan and follow up therapies     Assistance Recommended at Discharge Intermittent Supervision/Assistance  Patient can return home with the following A little help with walking and/or transfers;A little help with bathing/dressing/bathroom;Assistance with cooking/housework;Assist for transportation;Help with stairs or ramp for entrance   Equipment Recommendations  Rolling walker (2 wheels)    Recommendations for Other Services       Precautions / Restrictions Precautions Precautions: Knee Required Braces or Orthoses: Knee Immobilizer - Left Knee Immobilizer - Left: Discontinue once straight leg raise with < 10 degree lag Restrictions Weight Bearing Restrictions: No LLE Weight Bearing: Weight bearing as tolerated     Mobility  Bed Mobility Overal bed mobility: Needs Assistance Bed Mobility: Supine to Sit, Sit to Supine     Supine to sit: Min assist Sit to supine: Min assist   General bed mobility comments: Increased time with assist for L LE    Transfers Overall transfer level: Needs assistance Equipment used: Rolling  walker (2 wheels) Transfers: Sit to/from Stand Sit to Stand: Min assist           General transfer comment: cues for LE management and use of UEs to self assist    Ambulation/Gait Ambulation/Gait assistance: Min assist Gait Distance (Feet): 42 Feet Assistive device: Rolling walker (2 wheels) Gait Pattern/deviations: Step-to pattern, Decreased step length - right, Decreased step length - left, Shuffle, Trunk flexed Gait velocity: decr     General Gait Details: cues for sequence, posture and position from RW; multiple standing rest breaks and pt tolerating min WB on L LE   Stairs             Wheelchair Mobility    Modified Rankin (Stroke Patients Only)       Balance Overall balance assessment: Needs assistance Sitting-balance support: No upper extremity supported, Feet supported Sitting balance-Leahy Scale: Good     Standing balance support: Bilateral upper extremity supported Standing balance-Leahy Scale: Poor                              Cognition Arousal/Alertness: Awake/alert Behavior During Therapy: WFL for tasks assessed/performed Overall Cognitive Status: Within Functional Limits for tasks assessed                                          Exercises      General Comments        Pertinent Vitals/Pain Pain Assessment Pain Assessment: 0-10 Pain Score: 8  Pain Location: L knee Pain  Descriptors / Indicators: Aching, Sore Pain Intervention(s): Premedicated before session    Home Living                          Prior Function            PT Goals (current goals can now be found in the care plan section) Acute Rehab PT Goals Patient Stated Goal: Regain IND PT Goal Formulation: With patient Time For Goal Achievement: 11/30/21 Potential to Achieve Goals: Good Progress towards PT goals: Progressing toward goals    Frequency    7X/week      PT Plan Current plan remains appropriate     Co-evaluation              AM-PAC PT "6 Clicks" Mobility   Outcome Measure  Help needed turning from your back to your side while in a flat bed without using bedrails?: A Little Help needed moving from lying on your back to sitting on the side of a flat bed without using bedrails?: A Little Help needed moving to and from a bed to a chair (including a wheelchair)?: A Little Help needed standing up from a chair using your arms (e.g., wheelchair or bedside chair)?: A Little Help needed to walk in hospital room?: A Little Help needed climbing 3-5 steps with a railing? : A Lot 6 Click Score: 17    End of Session         PT Visit Diagnosis: Difficulty in walking, not elsewhere classified (R26.2)     Time: 0076-2263 PT Time Calculation (min) (ACUTE ONLY): 22 min  Charges:  $Gait Training: 8-22 mins                     Mauro Kaufmann PT Acute Rehabilitation Services Pager [phone] Office [phone]    Malvina Schadler 11/24/2021, 2:00 PM

## 2021-11-24 NOTE — Progress Notes (Signed)
Physical Therapy Treatment Patient Details Name: Frank [name]. MRN: [id number] DOB: 12/07/1965 Today's Date: 11/24/2021   History of Present Illness Pt s/p L TKR and with hx of DM, obesity, and L THR (19)    PT Comments    Pt with improved pain control following Dilaudid and Toradol and able to initiate therex program with increased time and multiple rest breaks required.   Recommendations for follow up therapy are one component of a multi-disciplinary discharge planning process, led by the attending physician.  Recommendations may be updated based on patient status, additional functional criteria and insurance authorization.  Follow Up Recommendations  Follow physician's recommendations for discharge plan and follow up therapies     Assistance Recommended at Discharge Intermittent Supervision/Assistance  Patient can return home with the following A little help with walking and/or transfers;A little help with bathing/dressing/bathroom;Assistance with cooking/housework;Assist for transportation;Help with stairs or ramp for entrance   Equipment Recommendations  Rolling walker (2 wheels)    Recommendations for Other Services       Precautions / Restrictions Precautions Precautions: Knee Required Braces or Orthoses: Knee Immobilizer - Left Knee Immobilizer - Left: Discontinue once straight leg raise with < 10 degree lag Restrictions Weight Bearing Restrictions: No LLE Weight Bearing: Weight bearing as tolerated     Mobility  Bed Mobility Overal bed mobility: Needs Assistance Bed Mobility: Supine to Sit, Sit to Supine     Supine to sit: Min assist Sit to supine: Min assist   General bed mobility comments: Increased time with assist for L LE    Transfers Overall transfer level: Needs assistance Equipment used: Rolling walker (2 wheels) Transfers: Sit to/from Stand Sit to Stand: Min assist           General transfer comment: cues for LE management and use of UEs  to self assist    Ambulation/Gait Ambulation/Gait assistance: Min assist Gait Distance (Feet): 42 Feet Assistive device: Rolling walker (2 wheels) Gait Pattern/deviations: Step-to pattern, Decreased step length - right, Decreased step length - left, Shuffle, Trunk flexed Gait velocity: decr     General Gait Details: cues for sequence, posture and position from RW; multiple standing rest breaks and pt tolerating min WB on L LE   Stairs             Wheelchair Mobility    Modified Rankin (Stroke Patients Only)       Balance Overall balance assessment: Needs assistance Sitting-balance support: No upper extremity supported, Feet supported Sitting balance-Leahy Scale: Good     Standing balance support: Bilateral upper extremity supported Standing balance-Leahy Scale: Poor                              Cognition Arousal/Alertness: Awake/alert Behavior During Therapy: WFL for tasks assessed/performed Overall Cognitive Status: Within Functional Limits for tasks assessed                                          Exercises Total Joint Exercises Ankle Circles/Pumps: AROM, Both, 15 reps, Supine Quad Sets: AROM, Left, Supine, 10 reps Heel Slides: AAROM, 10 reps, Supine Hip ABduction/ADduction: AAROM, Left, 10 reps, Supine Straight Leg Raises: AAROM, Left, 10 reps, Supine    General Comments        Pertinent Vitals/Pain Pain Assessment Pain Assessment: 0-10 Pain Score: 5  Pain Location:  L knee Pain Descriptors / Indicators: Aching, Sore Pain Intervention(s): Limited activity within patient's tolerance, Monitored during session, Ice applied    Home Living                          Prior Function            PT Goals (current goals can now be found in the care plan section) Acute Rehab PT Goals Patient Stated Goal: Regain IND PT Goal Formulation: With patient Time For Goal Achievement: 11/30/21 Potential to Achieve Goals:  Good Progress towards PT goals: Progressing toward goals    Frequency    7X/week      PT Plan Current plan remains appropriate    Co-evaluation              AM-PAC PT "6 Clicks" Mobility   Outcome Measure  Help needed turning from your back to your side while in a flat bed without using bedrails?: A Little Help needed moving from lying on your back to sitting on the side of a flat bed without using bedrails?: A Little Help needed moving to and from a bed to a chair (including a wheelchair)?: A Little Help needed standing up from a chair using your arms (e.g., wheelchair or bedside chair)?: A Little Help needed to walk in hospital room?: A Little Help needed climbing 3-5 steps with a railing? : A Lot 6 Click Score: 17    End of Session   Activity Tolerance: Patient tolerated treatment well Patient left: in bed;with call bell/phone within reach;with bed alarm set Nurse Communication: Mobility status PT Visit Diagnosis: Difficulty in walking, not elsewhere classified (R26.2)     Time: 6213-0865 PT Time Calculation (min) (ACUTE ONLY): 31 min  Charges:  $Gait Training: 8-22 mins $Therapeutic Exercise: 8-22 mins                     Mauro Kaufmann PT Acute Rehabilitation Services Pager [phone] Office [phone]    Rowyn Mustapha 11/24/2021, 4:41 PM

## 2021-11-24 NOTE — Discharge Instructions (Signed)

## 2021-11-24 NOTE — Plan of Care (Signed)
  Problem: Coping: Goal: Level of anxiety will decrease Outcome: Progressing   Problem: Pain Managment: Goal: General experience of comfort will improve Outcome: Progressing   Problem: Safety: Goal: Ability to remain free from injury will improve Outcome: Progressing   

## 2021-11-25 DIAGNOSIS — M1712 Unilateral primary osteoarthritis, left knee: Secondary | ICD-10-CM | POA: Diagnosis not present

## 2021-11-25 NOTE — Discharge Summary (Signed)
Patient ID: Frank [name]. MRN: [id number] DOB/AGE: February 03, 1966 56 y.o.  Admit date: 11/23/2021 Discharge date: 11/25/2021  Admission Diagnoses:  Unilateral primary osteoarthritis, left knee  Discharge Diagnoses:  Principal Problem:   Unilateral primary osteoarthritis, left knee Active Problems:   Status post total left knee replacement   Past Medical History:  Diagnosis Date   Anxiety    Arthritis    Asthma    Current every day smoker    Depression    Diabetes mellitus, type II (HCC)    Dyspnea    Hyperlipidemia    Hypertension    Morbid obesity (HCC)    Sedentary lifestyle     Surgeries: Procedure(s): LEFT TOTAL KNEE ARTHROPLASTY on 11/23/2021   Consultants (if any):   Discharged Condition: Improved  Hospital Course: Frank [name]. is an 56 y.o. male who was admitted 11/23/2021 with a diagnosis of Unilateral primary osteoarthritis, left knee and went to the operating room on 11/23/2021 and underwent the above named procedures.    He was given perioperative antibiotics:  Anti-infectives (From admission, onward)    Start     Dose/Rate Route Frequency Ordered Stop   11/23/21 1430  ceFAZolin (ANCEF) IVPB 1 g/50 mL premix        1 g 100 mL/hr over 30 Minutes Intravenous Every 6 hours 11/23/21 1229 11/23/21 2044   11/23/21 0600  ceFAZolin (ANCEF) IVPB 2g/100 mL premix        2 g 200 mL/hr over 30 Minutes Intravenous On call to O.R. 11/23/21 1093 11/23/21 2355     .  He was given sequential compression devices, early ambulation, and appropriate chemoprophylaxis for DVT prophylaxis.  He benefited maximally from the hospital stay and there were no complications.    Recent vital [name]:  Vitals:   11/25/21 0507 11/25/21 0759  BP: (!) 144/93   Pulse: 85   Resp: 16   Temp: 98.3 F (36.8 C)   SpO2: 92% 93%    Recent laboratory studies:  Lab Results  Component Value Date   HGB 11.9 (L) 11/24/2021   HGB 11.8 (L) 11/12/2021   HGB 11.2 (L) 12/27/2017    Lab Results  Component Value Date   WBC 11.9 (H) 11/24/2021   PLT 285 11/24/2021   No results found for: "INR" Lab Results  Component Value Date   NA 137 11/24/2021   K 4.5 11/24/2021   CL 104 11/24/2021   CO2 26 11/24/2021   BUN 16 11/24/2021   CREATININE 0.93 11/24/2021   GLUCOSE 153 (H) 11/24/2021    Discharge Medications:   Allergies as of 11/25/2021       Reactions   Morphine And Related Anaphylaxis   Shellfish Allergy Anaphylaxis   Betadine [povidone Iodine] Itching   Chlorhexidine Hives        Medication List     STOP taking these medications    cyclobenzaprine 10 MG tablet Commonly known as: FLEXERIL   cyclobenzaprine 5 MG tablet Commonly known as: FLEXERIL   HYDROcodone-acetaminophen 7.5-325 MG tablet Commonly known as: NORCO   methocarbamol 500 MG tablet Commonly known as: ROBAXIN   methocarbamol 750 MG tablet Commonly known as: ROBAXIN   Olmesartan-amLODIPine-HCTZ 40-10-25 MG Tabs   oxyCODONE 5 MG immediate release tablet Commonly known as: Oxy IR/ROXICODONE       TAKE these medications    albuterol 108 (90 Base) MCG/ACT inhaler Commonly known as: VENTOLIN HFA Inhale 2 puffs into the lungs every 6 (six) hours as  needed for wheezing or shortness of breath.   aspirin 81 MG chewable tablet Chew 1 tablet (81 mg total) by mouth 2 (two) times daily.   atorvastatin 40 MG tablet Commonly known as: LIPITOR Take 40 mg by mouth daily.   busPIRone 15 MG tablet Commonly known as: BUSPAR Take 15 mg by mouth 2 (two) times daily.   celecoxib 200 MG capsule Commonly known as: CELEBREX Take 200 mg by mouth daily.   divalproex 500 MG DR tablet Commonly known as: DEPAKOTE Take 500 mg by mouth 3 (three) times daily.   EPINEPHrine 0.3 mg/0.3 mL Soaj injection Commonly known as: EPI-PEN Inject 0.3 mg into the muscle as needed for anaphylaxis.   esomeprazole 40 MG capsule Commonly known as: NEXIUM Take 40 mg by mouth daily.    Fluticasone-Salmeterol 500-50 MCG/DOSE Aepb Commonly known as: ADVAIR Inhale 1 puff into the lungs in the morning.   furosemide 40 MG tablet Commonly known as: LASIX Take 40 mg by mouth.   gabapentin 300 MG capsule Commonly known as: NEURONTIN Take 300 mg by mouth 3 (three) times daily as needed (pain).   hydrALAZINE 10 MG tablet Commonly known as: APRESOLINE Take 10 mg by mouth daily.   HYDROmorphone 4 MG tablet Commonly known as: Dilaudid Take 1 tablet (4 mg total) by mouth every 4 (four) hours as needed for severe pain.   hydrOXYzine 25 MG tablet Commonly known as: ATARAX Take 25 mg by mouth daily.   metFORMIN 1000 MG tablet Commonly known as: GLUCOPHAGE Take 1,000 mg by mouth 2 (two) times daily with a meal.   montelukast 10 MG tablet Commonly known as: SINGULAIR Take 10 mg by mouth at bedtime.   OVER THE COUNTER MEDICATION Take 1 capsule by mouth daily. Burdock Root Supplement   PARoxetine 40 MG tablet Commonly known as: PAXIL Take 40 mg by mouth every morning.   spironolactone 25 MG tablet Commonly known as: ALDACTONE Take 25 mg by mouth at bedtime.   SYRINGE 3CC/21GX1" 21G X 1" 3 ML Misc Used to inject testosterone every 14 days   Syringe/Needle (Disp) 25G X 1" 1 ML Misc Use as directed   tamsulosin 0.4 MG Caps capsule Commonly known as: FLOMAX Take 0.4 mg by mouth daily.   testosterone cypionate 200 MG/ML injection Commonly known as: DEPOTESTOSTERONE CYPIONATE Inject 0.5 mLs (100 mg total) into the muscle every 14 (fourteen) days. What changed: how much to take   tiZANidine 4 MG tablet Commonly known as: Zanaflex Take 1 tablet (4 mg total) by mouth every 8 (eight) hours as needed for muscle spasms.   verapamil 120 MG tablet Commonly known as: CALAN Take 120 mg by mouth daily.        Diagnostic Studies: DG Knee Left Port  Result Date: 11/23/2021 CLINICAL DATA:  Left knee replacement EXAM: PORTABLE LEFT KNEE - 1-2 VIEW COMPARISON:  None  Available. FINDINGS: Left knee demonstrates a total knee arthroplasty without evidence of hardware failure complication. No significant joint effusion. No fracture or dislocation. Alignment is anatomic. Post-surgical changes noted in the surrounding soft tissues. IMPRESSION: 1. Interval left total knee arthroplasty. Electronically Signed   By: Elige Ko M.D.   On: 11/23/2021 10:39    Disposition: Discharge disposition: 01-Home or Self Care          Follow-up Information     Kathryne Hitch, MD Follow up in 2 week(s).   Specialty: Orthopedic Surgery Contact information: [address] [place] [address] [phone]  SignedVanetta Mulders 11/25/2021, 9:31 PM

## 2021-11-25 NOTE — Progress Notes (Signed)
Reviewed d/c instructions w/ patient and patient's wife via phone. Patient verbalized understanding of all instruction. Will d/c home w/ belongings, instructions and equipment. Awaiting POV for home.

## 2021-11-25 NOTE — Plan of Care (Signed)
  Problem: Coping: Goal: Level of anxiety will decrease Outcome: Progressing   Problem: Pain Managment: Goal: General experience of comfort will improve Outcome: Progressing   

## 2021-11-25 NOTE — Progress Notes (Signed)
Subjective: 2 Days Post-Op Procedure(s) (LRB): LEFT TOTAL KNEE ARTHROPLASTY (Left) Patient reports pain as mild today, better controlled. Worked with Pt.  Labs and vitals stable.  Objective: Vital signs in last 24 hours: Temp:  [97.5 F (36.4 C)-98.7 F (37.1 C)] 98.3 F (36.8 C) (09/10 0507) Pulse Rate:  [78-89] 85 (09/10 0507) Resp:  [16-18] 16 (09/10 0507) BP: (108-153)/(82-106) 144/93 (09/10 0507) SpO2:  [92 %-100 %] 92 % (09/10 0507)  Intake/Output from previous day: 09/09 0701 - 09/10 0700 In: 240 [P.O.:240] Out: 1570 [Urine:1570] Intake/Output this shift: No intake/output data recorded.  Recent Labs    11/24/21 0327  HGB 11.9*    Recent Labs    11/24/21 0327  WBC 11.9*  RBC 4.29  HCT 37.8*  PLT 285    Recent Labs    11/24/21 0327  NA 137  K 4.5  CL 104  CO2 26  BUN 16  CREATININE 0.93  GLUCOSE 153*  CALCIUM 8.5*    No results for input(s): "LABPT", "INR" in the last 72 hours.  Sensation intact distally Intact pulses distally Dorsiflexion/Plantar flexion intact Incision: dressing C/D/I Compartment soft   Assessment/Plan: 2 Days Post-Op Procedure(s) (LRB): LEFT TOTAL KNEE ARTHROPLASTY (Left) Plan for DC today, medically stable     Frank [name] 11/25/2021, 7:54 AM

## 2021-11-25 NOTE — TOC Transition Note (Addendum)
Transition of Care [facility]) - CM/SW Discharge Note   Patient Details  Name: Frank [name]. MRN: [id number] Date of Birth: 1965-12-31  [facility]) CM/SW Contact:  Darleene Cleaver, LCSW Phone Number: 11/25/2021, 11:56 AM   Clinical Narrative:     CSW was informed that patient will need a rolling walker.  CSW contacted [employer] and they will deliver rolling walker to patient's room before he is medically ready for discharge.  Patient has been setup with outpatient PT.                    Final next level of care: Home/Self Care Barriers to Discharge: Barriers Resolved   Patient Goals and CMS Choice Patient states their goals for this hospitalization and ongoing recovery are:: To return back home. CMS Medicare.gov Compare Post Acute Care list provided to:: Patient Choice offered to / list presented to : Patient  Discharge Placement             Home with outpatient PT.          Discharge Plan and Services    Home self-care.              DME Agency: [employer] Date DME Agency Contacted: 11/25/21 Time DME Agency Contacted: 1153 Representative spoke with at DME Agency: Leavy Cella            Social Determinants of Health (SDOH) Interventions     Readmission Risk Interventions     No data to display

## 2021-11-25 NOTE — Progress Notes (Signed)
Physical Therapy Treatment Patient Details Name: Frank [name]. MRN: [id number] DOB: 02/15/1966 Today's Date: 11/25/2021   History of Present Illness Pt s/p L TKR and with hx of DM, obesity, and L THR (19)    PT Comments    Pt with marked improvement in activity tolerance and pain control.  Pt up to ambulate increased distance, tolerated increased WB on L LE, performed therex program with assist and reviewed don/doff KI.  Pt eager for dc home this date.   Recommendations for follow up therapy are one component of a multi-disciplinary discharge planning process, led by the attending physician.  Recommendations may be updated based on patient status, additional functional criteria and insurance authorization.  Follow Up Recommendations  Follow physician's recommendations for discharge plan and follow up therapies     Assistance Recommended at Discharge Intermittent Supervision/Assistance  Patient can return home with the following A little help with walking and/or transfers;A little help with bathing/dressing/bathroom;Assistance with cooking/housework;Assist for transportation;Help with stairs or ramp for entrance   Equipment Recommendations  Rolling walker (2 wheels)    Recommendations for Other Services       Precautions / Restrictions Precautions Precautions: Knee Required Braces or Orthoses: Knee Immobilizer - Left Knee Immobilizer - Left: Discontinue once straight leg raise with < 10 degree lag Restrictions Weight Bearing Restrictions: No LLE Weight Bearing: Weight bearing as tolerated     Mobility  Bed Mobility Overal bed mobility: Needs Assistance Bed Mobility: Supine to Sit, Sit to Supine     Supine to sit: Supervision Sit to supine: Supervision   General bed mobility comments: Increased time with pt self assist for L LE    Transfers Overall transfer level: Needs assistance Equipment used: Rolling walker (2 wheels) Transfers: Sit to/from Stand Sit to  Stand: Min guard, Supervision           General transfer comment: cues for LE management and use of UEs to self assist    Ambulation/Gait Ambulation/Gait assistance: Min guard, Supervision Gait Distance (Feet): 120 Feet Assistive device: Rolling walker (2 wheels) Gait Pattern/deviations: Step-to pattern, Decreased step length - right, Decreased step length - left, Shuffle, Trunk flexed Gait velocity: decr     General Gait Details: cues for sequence, posture and position from [employer]             Wheelchair Mobility    Modified Rankin (Stroke Patients Only)       Balance Overall balance assessment: Needs assistance Sitting-balance support: No upper extremity supported, Feet supported Sitting balance-Leahy Scale: Good     Standing balance support: No upper extremity supported Standing balance-Leahy Scale: Fair                              Cognition Arousal/Alertness: Awake/alert Behavior During Therapy: WFL for tasks assessed/performed Overall Cognitive Status: Within Functional Limits for tasks assessed                                          Exercises Total Joint Exercises Ankle Circles/Pumps: AROM, Both, 15 reps, Supine Quad Sets: AROM, Left, Supine, 10 reps Heel Slides: AAROM, Supine, 15 reps Hip ABduction/ADduction: AAROM, Left, 10 reps, Supine Straight Leg Raises: AAROM, Left, Supine, 15 reps Goniometric ROM: -5 - 45    General Comments        Pertinent  Vitals/Pain Pain Assessment Pain Assessment: 0-10 Pain Score: 3  Pain Location: L knee Pain Descriptors / Indicators: Aching, Sore Pain Intervention(s): Limited activity within patient's tolerance, Monitored during session, Premedicated before session, Ice applied    Home Living                          Prior Function            PT Goals (current goals can now be found in the care plan section) Acute Rehab PT Goals Patient Stated Goal:  Regain IND PT Goal Formulation: With patient Time For Goal Achievement: 11/30/21 Potential to Achieve Goals: Good Progress towards PT goals: Progressing toward goals    Frequency    7X/week      PT Plan Current plan remains appropriate    Co-evaluation              AM-PAC PT "6 Clicks" Mobility   Outcome Measure  Help needed turning from your back to your side while in a flat bed without using bedrails?: A Little Help needed moving from lying on your back to sitting on the side of a flat bed without using bedrails?: A Little Help needed moving to and from a bed to a chair (including a wheelchair)?: A Little Help needed standing up from a chair using your arms (e.g., wheelchair or bedside chair)?: A Little Help needed to walk in hospital room?: A Little Help needed climbing 3-5 steps with a railing? : A Little 6 Click Score: 18    End of Session Equipment Utilized During Treatment: Gait belt;Left knee immobilizer Activity Tolerance: Patient tolerated treatment well Patient left: in bed;with call bell/phone within reach;with bed alarm set Nurse Communication: Mobility status PT Visit Diagnosis: Difficulty in walking, not elsewhere classified (R26.2)     Time: 3016-0109 PT Time Calculation (min) (ACUTE ONLY): 40 min  Charges:  $Gait Training: 8-22 mins $Therapeutic Exercise: 8-22 mins                     Mauro Kaufmann PT Acute Rehabilitation Services Pager [phone] Office [phone]    Frank [name] 11/25/2021, 11:05 AM

## 2021-11-26 ENCOUNTER — Encounter (HOSPITAL_COMMUNITY): Payer: Self-pay | Admitting: Orthopaedic Surgery

## 2021-12-02 ENCOUNTER — Other Ambulatory Visit: Payer: Self-pay | Admitting: Orthopaedic Surgery

## 2021-12-06 ENCOUNTER — Encounter: Payer: Self-pay | Admitting: Orthopaedic Surgery

## 2021-12-06 ENCOUNTER — Ambulatory Visit (INDEPENDENT_AMBULATORY_CARE_PROVIDER_SITE_OTHER): Payer: Medicare PPO | Admitting: Orthopaedic Surgery

## 2021-12-06 DIAGNOSIS — Z96652 Presence of left artificial knee joint: Secondary | ICD-10-CM

## 2021-12-06 NOTE — Progress Notes (Signed)
This is the first postoperative visit for Frank [name] status post a left knee replacement.  He reports that he is doing well overall.  He is already in outpatient physical therapy.  He is really not taking much pain medicine at all.  Has been icing his knee several times a day as well.  His extension is full of his left operative knee and I can flex him to just past 90 degrees.  His calf is soft.  His knee feels stable.  He will continue to push himself through therapy.  We will see him back in 4 weeks to see how he is doing overall but no x-rays are needed.  He is pleased thus far as am I.

## 2021-12-16 ENCOUNTER — Other Ambulatory Visit: Payer: Self-pay | Admitting: Orthopaedic Surgery

## 2021-12-31 ENCOUNTER — Ambulatory Visit: Payer: Medicare PPO | Admitting: Orthopaedic Surgery

## 2022-01-02 ENCOUNTER — Ambulatory Visit: Payer: Medicare PPO | Admitting: Orthopaedic Surgery

## 2022-01-07 ENCOUNTER — Ambulatory Visit: Payer: Medicare PPO | Admitting: Orthopaedic Surgery

## 2022-01-09 ENCOUNTER — Other Ambulatory Visit: Payer: Self-pay

## 2022-01-09 ENCOUNTER — Emergency Department (HOSPITAL_COMMUNITY)
Admission: EM | Admit: 2022-01-09 | Discharge: 2022-01-09 | Disposition: A | Discharge status: Home / Self Care | Payer: Medicare PPO | Attending: Student | Admitting: Student

## 2022-01-09 ENCOUNTER — Emergency Department (HOSPITAL_COMMUNITY): Payer: Medicare PPO

## 2022-01-09 ENCOUNTER — Encounter (HOSPITAL_COMMUNITY): Payer: Self-pay

## 2022-01-09 DIAGNOSIS — S4992XA Unspecified injury of left shoulder and upper arm, initial encounter: Secondary | ICD-10-CM | POA: Diagnosis present

## 2022-01-09 DIAGNOSIS — Z96652 Presence of left artificial knee joint: Secondary | ICD-10-CM | POA: Diagnosis not present

## 2022-01-09 DIAGNOSIS — Z96642 Presence of left artificial hip joint: Secondary | ICD-10-CM | POA: Insufficient documentation

## 2022-01-09 DIAGNOSIS — Z79899 Other long term (current) drug therapy: Secondary | ICD-10-CM | POA: Insufficient documentation

## 2022-01-09 DIAGNOSIS — I10 Essential (primary) hypertension: Secondary | ICD-10-CM | POA: Diagnosis not present

## 2022-01-09 DIAGNOSIS — Y92231 Patient bathroom in hospital as the place of occurrence of the external cause: Secondary | ICD-10-CM | POA: Diagnosis not present

## 2022-01-09 DIAGNOSIS — W182XXA Fall in (into) shower or empty bathtub, initial encounter: Secondary | ICD-10-CM | POA: Diagnosis not present

## 2022-01-09 DIAGNOSIS — S42492A Other displaced fracture of lower end of left humerus, initial encounter for closed fracture: Secondary | ICD-10-CM | POA: Insufficient documentation

## 2022-01-09 DIAGNOSIS — R209 Unspecified disturbances of skin sensation: Secondary | ICD-10-CM | POA: Insufficient documentation

## 2022-01-09 DIAGNOSIS — Z7982 Long term (current) use of aspirin: Secondary | ICD-10-CM | POA: Insufficient documentation

## 2022-01-09 DIAGNOSIS — M79602 Pain in left arm: Secondary | ICD-10-CM | POA: Diagnosis not present

## 2022-01-09 DIAGNOSIS — Z7984 Long term (current) use of oral hypoglycemic drugs: Secondary | ICD-10-CM | POA: Insufficient documentation

## 2022-01-09 DIAGNOSIS — J45909 Unspecified asthma, uncomplicated: Secondary | ICD-10-CM | POA: Insufficient documentation

## 2022-01-09 DIAGNOSIS — E119 Type 2 diabetes mellitus without complications: Secondary | ICD-10-CM | POA: Insufficient documentation

## 2022-01-09 DIAGNOSIS — Z7951 Long term (current) use of inhaled steroids: Secondary | ICD-10-CM | POA: Insufficient documentation

## 2022-01-09 MED ORDER — OXYCODONE-ACETAMINOPHEN 5-325 MG PO TABS: 1.0000 | ORAL_TABLET | Freq: Four times a day (QID) | ORAL | 0 refills | Status: DC | PRN

## 2022-01-09 MED ORDER — TIZANIDINE HCL 4 MG PO TABS: 4.0000 mg | ORAL_TABLET | Freq: Three times a day (TID) | ORAL | 0 refills | Status: DC | PRN

## 2022-01-09 MED ORDER — NAPROXEN 500 MG PO TABS: 500.0000 mg | ORAL_TABLET | Freq: Two times a day (BID) | ORAL | 0 refills | Status: DC

## 2022-01-09 MED ORDER — OXYCODONE-ACETAMINOPHEN 5-325 MG PO TABS
2.0000 | ORAL_TABLET | Freq: Once | ORAL | Status: AC
  Administered 2022-01-09: 2 via ORAL
  Filled 2022-01-09: qty 2

## 2022-01-09 NOTE — ED Triage Notes (Signed)
CC: Fall, left arm pain  Pt reports falling in bath. Pt reports left arm injury.

## 2022-01-09 NOTE — ED Provider Notes (Signed)
[facility] EMERGENCY DEPARTMENT Provider Note  CSN: [id number] Arrival date & time: 01/09/22 1451  Chief Complaint(s) No chief complaint on file.  HPI Frank [name]. is a 56 y.o. male with PMH anxiety, tobacco use disorder, T2DM, HTN, HLD who presents emergency department for evaluation of left arm pain.  Patient states that approximately 10 days ago he was lifting a window and felt a pop in his forearm but this pain ultimately resolved.  He then got into the bathtub today and slipped, striking his left elbow against the side of the bathtub.  He felt immediate pain that extends from the mid humerus down to the mid forearm.  Endorses some mild numbness in both the median and radial distribution on the left.  No weakness of the upper extremity.  Denies additional traumatic or systemic complaints.   Past Medical History Past Medical History:  Diagnosis Date   Anxiety    Arthritis    Asthma    Current every day smoker    Depression    Diabetes mellitus, type II ([place])    Dyspnea    Hyperlipidemia    Hypertension    Morbid obesity ([place])    Sedentary lifestyle    Patient Active Problem List   Diagnosis Date Noted   Status post total left knee replacement 11/23/2021   Unilateral primary osteoarthritis, right knee 01/25/2019   Carpal tunnel syndrome, left upper limb 10/15/2018   Carpal tunnel syndrome, right upper limb 10/15/2018   Bilateral hand numbness 09/09/2018   Plantar fasciitis of left foot 09/09/2018   Status post total replacement of left hip 12/26/2017   Unilateral primary osteoarthritis, left knee 11/12/2017   Hypogonadism, male 05/08/2016   Home Medication(s) Prior to Admission medications   Medication Sig Start Date End Date Taking? Authorizing Provider  albuterol (PROVENTIL HFA;VENTOLIN HFA) 108 (90 Base) MCG/ACT inhaler Inhale 2 puffs into the lungs every 6 (six) hours as needed for wheezing or shortness of breath.    [provider]  aspirin 81 MG  chewable tablet Chew 1 tablet (81 mg total) by mouth 2 (two) times daily. 11/24/21   Mcarthur Rossetti, MD  atorvastatin (LIPITOR) 40 MG tablet Take 40 mg by mouth daily.    [provider]  busPIRone (BUSPAR) 15 MG tablet Take 15 mg by mouth 2 (two) times daily.    [provider]  celecoxib (CELEBREX) 200 MG capsule Take 200 mg by mouth daily.    [provider]  divalproex (DEPAKOTE) 500 MG DR tablet Take 500 mg by mouth 3 (three) times daily.    [provider]  EPINEPHrine 0.3 mg/0.3 mL IJ SOAJ injection Inject 0.3 mg into the muscle as needed for anaphylaxis. 08/28/21   [provider]  esomeprazole (NEXIUM) 40 MG capsule Take 40 mg by mouth daily.     [provider]  Fluticasone-Salmeterol (ADVAIR) 500-50 MCG/DOSE AEPB Inhale 1 puff into the lungs in the morning.    [provider]  furosemide (LASIX) 40 MG tablet Take 40 mg by mouth. Patient not taking: Reported on 11/15/2021    [provider]  gabapentin (NEURONTIN) 300 MG capsule Take 300 mg by mouth 3 (three) times daily as needed (pain).    [provider]  hydrALAZINE (APRESOLINE) 10 MG tablet Take 10 mg by mouth daily.    [provider]  HYDROmorphone (DILAUDID) 4 MG tablet Take 1 tablet (4 mg total) by mouth every 4 (four) hours as needed for severe  pain. 11/24/21   Kathryne Hitch, MD  hydrOXYzine (ATARAX) 25 MG tablet Take 25 mg by mouth daily.    [provider]  metFORMIN (GLUCOPHAGE) 1000 MG tablet Take 1,000 mg by mouth 2 (two) times daily with a meal.    [provider]  montelukast (SINGULAIR) 10 MG tablet Take 10 mg by mouth at bedtime.    [provider]  OVER THE COUNTER MEDICATION Take 1 capsule by mouth daily. Burdock Root Supplement    [provider]  PARoxetine (PAXIL) 40 MG tablet Take 40 mg by mouth every morning.    [provider]  spironolactone (ALDACTONE) 25 MG tablet  Take 25 mg by mouth at bedtime. 10/07/21   [provider]  Syringe/Needle, Disp, (SYRINGE 3CC/21GX1") 21G X 1" 3 ML MISC Used to inject testosterone every 14 days 05/16/16   Roma Kayser, MD  Syringe/Needle, Disp, 25G X 1" 1 ML MISC Use as directed 11/28/16   Roma Kayser, MD  tamsulosin (FLOMAX) 0.4 MG CAPS capsule Take 0.4 mg by mouth daily. 10/07/21   [provider]  testosterone cypionate (DEPOTESTOSTERONE CYPIONATE) 200 MG/ML injection Inject 0.5 mLs (100 mg total) into the muscle every 14 (fourteen) days. Patient taking differently: Inject 200 mg into the muscle every 14 (fourteen) days. 09/17/16   Roma Kayser, MD  tiZANidine (ZANAFLEX) 4 MG tablet TAKE 1 TABLET(4 MG) BY MOUTH EVERY 8 HOURS AS NEEDED FOR MUSCLE SPASMS 12/17/21   Kathryne Hitch, MD  verapamil (CALAN) 120 MG tablet Take 120 mg by mouth daily.    [provider]                                                                                                                                    Past Surgical History Past Surgical History:  Procedure Laterality Date   carpal tunel Left    OTHER SURGICAL HISTORY     R & L shoulder   OTHER SURGICAL HISTORY     Knee surgery   TOTAL HIP ARTHROPLASTY Left 12/26/2017   Procedure: LEFT TOTAL HIP ARTHROPLASTY ANTERIOR APPROACH;  Surgeon: Kathryne Hitch, MD;  Location: [facility];  Service: Orthopedics;  Laterality: Left;   TOTAL KNEE ARTHROPLASTY Left 11/23/2021   Procedure: LEFT TOTAL KNEE ARTHROPLASTY;  Surgeon: Kathryne Hitch, MD;  Location: [facility];  Service: Orthopedics;  Laterality: Left;   Family History Family History  Problem Relation Age of Onset   Diabetes Mother    Hypertension Mother    Heart attack Mother    Osteoporosis Mother    Diabetes Father    Hypertension Father    Heart attack Father    Diabetes Sister    Hypertension Sister    Diabetes Brother    Hypertension Brother     Social  History Social History   Tobacco Use   Smoking status: Former    Packs/day:  1.50    Years: 25.00    Total pack years: 37.50    Types: Cigarettes    Quit date: 12/15/2017    Years since quitting: 4.0   Smokeless tobacco: Never  Vaping Use   Vaping Use: Never used  Substance Use Topics   Alcohol use: No   Drug use: Yes    Types: Marijuana   Allergies Morphine and related, Shellfish allergy, Betadine [povidone iodine], and Chlorhexidine  Review of Systems Review of Systems  Musculoskeletal:  Positive for arthralgias, joint swelling and myalgias.  Neurological:  Positive for numbness.    Physical Exam Vital Signs  I have reviewed the triage vital signs BP (!) 186/116 (BP Location: Right Arm)   Pulse 79   Temp (!) 97.4 F (36.3 C) (Oral)   Resp (!) 22   Ht 5\' 10"  (1.778 m)   Wt 126.1 kg   SpO2 97%   BMI 39.89 kg/m   Physical Exam Constitutional:      General: He is not in acute distress.    Appearance: Normal appearance.  HENT:     Head: Normocephalic and atraumatic.     Nose: No congestion or rhinorrhea.  Eyes:     General:        Right eye: No discharge.        Left eye: No discharge.     Extraocular Movements: Extraocular movements intact.     Pupils: Pupils are equal, round, and reactive to light.  Cardiovascular:     Rate and Rhythm: Normal rate and regular rhythm.     Heart sounds: No murmur heard. Pulmonary:     Effort: No respiratory distress.     Breath sounds: No wheezing or rales.  Abdominal:     General: There is no distension.     Tenderness: There is no abdominal tenderness.  Musculoskeletal:        General: Swelling, tenderness and deformity present. Normal range of motion.     Cervical back: Normal range of motion.  Skin:    General: Skin is warm and dry.  Neurological:     General: No focal deficit present.     Mental Status: He is alert.     Sensory: Sensory deficit present.     ED Results and Treatments Labs (all labs ordered  are listed, but only abnormal results are displayed) Labs Reviewed - No data to display                                                                                                                        Radiology DG Humerus Left  Result Date: 01/09/2022 CLINICAL DATA:  Trauma, fall EXAM: LEFT HUMERUS - 2+ VIEW COMPARISON:  None Available. FINDINGS: Comminuted displaced fracture is seen in the distal metaphyseal region of humerus. There is 11 mm offset in alignment. Distal major fracture fragment is displaced medially in relation to the shaft. Severe degenerative changes are noted in left shoulder. IMPRESSION: Comminuted, displaced fracture is  seen in distal left humerus. Degenerative changes are noted in left shoulder. Electronically Signed   By: Ernie Avena M.D.   On: 01/09/2022 15:34    Pertinent labs & imaging results that were available during my care of the patient were reviewed by me and considered in my medical decision making (see MDM for details).  Medications Ordered in ED Medications - No data to display                                                                                                                                   Procedures Procedures  (including critical care time)  Medical Decision Making / ED Course   This patient presents to the ED for concern of left arm pain, this involves an extensive number of treatment options, and is a complaint that carries with it a high risk of complications and morbidity.  The differential diagnosis includes fracture, dislocation, nerve injury, contusion  MDM: Patient seen emergency room for evaluation of left elbow pain.  Physical exam with tenderness over the left elbow, distal humerus and mid forearm.  X-ray imaging concerning for distal humerus fracture.  Orthopedics was consulted and I spoke with Dr. Dallas Schimke who is recommending CT prior to discharge, posterior slab splint with sling, pain control and outpatient  follow-up.  These interventions were performed here in the emergency department and pain well controlled on reevaluation.  Patient then discharged with outpatient orthopedic follow-up   Additional history obtained: -Additional history obtained from wife -External records from outside source obtained and reviewed including: Chart review including previous notes, labs, imaging, consultation notes   Lab Tests: -I ordered, reviewed, and interpreted labs.   The pertinent results include:   Labs Reviewed - No data to display     Imaging Studies ordered: I ordered imaging studies including x-ray forearm, humerus, CT elbow I independently visualized and interpreted imaging. I agree with the radiologist interpretation   Medicines ordered and prescription drug management: No orders of the defined types were placed in this encounter.   -I have reviewed the patients home medicines and have made adjustments as needed  Critical interventions none  Consultations Obtained: I requested consultation with the orthopedic surgeon on-call,  and discussed lab and imaging findings as well as pertinent plan - they recommend: Splinting, CT and outpatient follow-up    Social Determinants of Health:  Factors impacting patients care include: Persistent tobacco use, counseled on cessation here in the ED   Reevaluation: After the interventions noted above, I reevaluated the patient and found that they have :improved  Co morbidities that complicate the patient evaluation  Past Medical History:  Diagnosis Date   Anxiety    Arthritis    Asthma    Current every day smoker    Depression    Diabetes mellitus, type II ([facility])    Dyspnea    Hyperlipidemia    Hypertension    Morbid obesity (  [facility])    Sedentary lifestyle       Dispostion: I considered admission for this patient, but patient does not meet inpatient criteria for admission at this time and is safe for discharge with close outpatient  orthopedic follow-up     Final Clinical Impression(s) / ED Diagnoses Final diagnoses:  None     @PCDICTATION @    Shell Yandow, , MD 01/09/22 [phone]

## 2022-01-14 ENCOUNTER — Ambulatory Visit: Payer: Self-pay | Admitting: Student

## 2022-01-14 ENCOUNTER — Ambulatory Visit: Payer: Medicare PPO | Admitting: Orthopaedic Surgery

## 2022-01-14 NOTE — H&P (Signed)
[facility] H&P  Patient ID: Frank [name]. MRN: [id number] DOB/AGE: 04/25/1965 56 y.o.  Reason for surgery: Left distal humerus fracture  HPI: Frank [name]. is an 56 y.o. male with PMH significant for anxiety, tobacco use disorder, T2DM, HTN, HLD presenting for surgery on the left upper extremity.  Patient slipped and fell in the bathtub on 01/09/2022.  Had immediate pain and numbness in the left arm.  Was evaluated in [facility] emergency department and found to have left distal humerus fracture.  Patient placed in a long-arm splint and sling and instructed to follow-up with orthopedics as an outpatient.  Due to the complex nature of the injury, Dr. Dallas Schimke felt this fracture would best be managed by a fellowship trained orthopedic traumatologist and thus asked O[facility] to evaluate patient for surgical intervention. Patient seen in OTS clinic on 01/14/2022.  Pain currently manageable.  Tolerating his splint well.  Denies any other injuries from his fall.  Other than aspirin 81 mg daily, not currently on any anticoagulation.  Diabetes well controlled on metformin, last hemoglobin A1c was 6.5 (10/2021).  Past Medical History:  Diagnosis Date   Anxiety    Arthritis    Asthma    Current every day smoker    Depression    Diabetes mellitus, type II ([facility])    Dyspnea    Hyperlipidemia    Hypertension    Morbid obesity ([facility])    Sedentary lifestyle     Past Surgical History:  Procedure Laterality Date   carpal tunel Left    OTHER SURGICAL HISTORY     R & L shoulder   OTHER SURGICAL HISTORY     Knee surgery   TOTAL HIP ARTHROPLASTY Left 12/26/2017   Procedure: LEFT TOTAL HIP ARTHROPLASTY ANTERIOR APPROACH;  Surgeon: Kathryne Hitch, MD;  Location: [facility];  Service: Orthopedics;  Laterality: Left;   TOTAL KNEE ARTHROPLASTY Left 11/23/2021   Procedure: LEFT TOTAL KNEE ARTHROPLASTY;  Surgeon: Kathryne Hitch, MD;  Location: [facility];  Service: Orthopedics;   Laterality: Left;    Family History  Problem Relation Age of Onset   Diabetes Mother    Hypertension Mother    Heart attack Mother    Osteoporosis Mother    Diabetes Father    Hypertension Father    Heart attack Father    Diabetes Sister    Hypertension Sister    Diabetes Brother    Hypertension Brother     Social History:  reports that he quit smoking about 4 years ago. His smoking use included cigarettes. He has a 37.50 pack-year smoking history. He has never used smokeless tobacco. He reports current drug use. Drug: Marijuana. He reports that he does not drink alcohol.  Allergies:  Allergies  Allergen Reactions   Morphine And Related Anaphylaxis   Shellfish Allergy Anaphylaxis   Betadine [Povidone Iodine] Itching   Chlorhexidine Hives    Medications: I have reviewed the patient's current medications. Prior to Admission:  No medications prior to admission.    ROS: Constitutional: No fever or chills Vision: No changes in vision ENT: No difficulty swallowing CV: No chest pain Pulm: No SOB or wheezing GI: No nausea or vomiting GU: No urgency or inability to hold urine Skin: No poor wound healing Neurologic: No numbness or tingling Psychiatric: No depression or anxiety Heme: No bruising Allergic: No reaction to medications or food   Exam: There were no vitals taken for this visit. General: No acute distress  Orientation: Alert and oriented x4 Mood and Affect: Mood and affect appropriate, pleasant and cooperative Gait: Within normal limits Coordination and balance: Within normal limits  Left upper extremity: Sling and long-arm splint in place.  Nontender above splint.  Able to wiggle fingers.  Endorses sensation to light touch over all aspects of the hand.  Motor function intact through the median, ulnar, radial nerve distribution of the hand.  Not easily able to assess motor or sensory function at the level of the wrist or the elbow due to splint being in place.   Fingers warm and well-perfused.  Brisk cap refill.  Compartments soft and compressible  Right upper extremity: Skin without lesions. No tenderness to palpation. Full painless ROM, full strength in each muscle group without evidence of instability.  Motor and sensory function at baseline.  Neurovascularly intact   Medical Decision Making: Data: Imaging: CT scan left elbow shows comminuted and displaced transcondylar fracture of the distal humerus.  Difficult to rule out presence of intra-articular involvement  Labs: No results found for this or any previous visit (from the past 168 hour(s)).    Assessment/Plan: 56 year old male with left distal humerus fracture  Patient with significant injury to left upper extremity which will require surgical intervention.  Based on fracture pattern, would not recommend nonoperative management.  Risks and benefits of the procedure have been discussed with the patient. Risks discussed included bleeding, infection, malunion, nonunion, damage to surrounding nerves and blood vessels, pain, hardware prominence or irritation, hardware failure, stiffness, post-traumatic arthritis, DVT/PE, compartment syndrome, and even anesthesia complications.  Patient states understanding of these risk and agrees to proceed with surgery.  Consent will be obtained.  We will plan to discharge patient home postoperatively   Gwinda Passe PA-C [facility] [phone] (office) [email or web address]

## 2022-01-15 ENCOUNTER — Encounter (HOSPITAL_COMMUNITY): Payer: Self-pay | Admitting: Student

## 2022-01-15 NOTE — Progress Notes (Signed)
PCP - Dr Teryl Lucy Cardiologist - n/a   Chest x-ray - n/a EKG - 11/12/21 Stress Test - n/a ECHO - 12/18/18 CE Cardiac Cath - 12/17/18  ICD Pacemaker/Loop - n/a  Sleep Study - Yes CPAP - uses cpap nightly  Do not take Metformin on the morning of surgery.  Patient does not check his blood sugars.  Do not take Celebrex today or on day of surgery.  Anesthesia review: Yes  STOP now taking any Aspirin (unless otherwise instructed by your surgeon), Aleve, Naproxen, Ibuprofen, Motrin, Advil, Goody's, BC's, all herbal medications, fish oil, and all vitamins.   Coronavirus Screening Does the patient have any of the following symptoms:  Cough yes/no: No Fever (>100.74F)  yes/no: No Runny nose yes/no: No Sore throat yes/no: No Difficulty breathing/shortness of breath  yes/no: No  Has the patient traveled in the last 14 days and where? yes/no: No  Wife Sharyn Lull verbalized understanding of instructions that were given via phone.

## 2022-01-16 ENCOUNTER — Encounter (HOSPITAL_COMMUNITY)
Admission: RE | Disposition: A | Discharge status: Home / Self Care | Payer: Self-pay | Source: Ambulatory Visit | Attending: Student

## 2022-01-16 ENCOUNTER — Ambulatory Visit (HOSPITAL_COMMUNITY): Payer: Medicare PPO | Admitting: Physician Assistant

## 2022-01-16 ENCOUNTER — Other Ambulatory Visit: Payer: Self-pay

## 2022-01-16 ENCOUNTER — Ambulatory Visit (HOSPITAL_COMMUNITY): Payer: Medicare PPO

## 2022-01-16 ENCOUNTER — Encounter (HOSPITAL_COMMUNITY): Payer: Self-pay | Admitting: Student

## 2022-01-16 ENCOUNTER — Ambulatory Visit (HOSPITAL_BASED_OUTPATIENT_CLINIC_OR_DEPARTMENT_OTHER): Payer: Medicare PPO | Admitting: Physician Assistant

## 2022-01-16 ENCOUNTER — Ambulatory Visit (HOSPITAL_COMMUNITY)
Admission: RE | Admit: 2022-01-16 | Discharge: 2022-01-16 | Disposition: A | Discharge status: Home / Self Care | Payer: Medicare PPO | Source: Ambulatory Visit | Attending: Student | Admitting: Student

## 2022-01-16 DIAGNOSIS — J45909 Unspecified asthma, uncomplicated: Secondary | ICD-10-CM | POA: Diagnosis not present

## 2022-01-16 DIAGNOSIS — Y9389 Activity, other specified: Secondary | ICD-10-CM | POA: Diagnosis not present

## 2022-01-16 DIAGNOSIS — M1990 Unspecified osteoarthritis, unspecified site: Secondary | ICD-10-CM | POA: Insufficient documentation

## 2022-01-16 DIAGNOSIS — Z6841 Body Mass Index (BMI) 40.0 and over, adult: Secondary | ICD-10-CM | POA: Diagnosis not present

## 2022-01-16 DIAGNOSIS — W010XXA Fall on same level from slipping, tripping and stumbling without subsequent striking against object, initial encounter: Secondary | ICD-10-CM | POA: Insufficient documentation

## 2022-01-16 DIAGNOSIS — Z7984 Long term (current) use of oral hypoglycemic drugs: Secondary | ICD-10-CM | POA: Diagnosis not present

## 2022-01-16 DIAGNOSIS — Z87891 Personal history of nicotine dependence: Secondary | ICD-10-CM

## 2022-01-16 DIAGNOSIS — S42412A Displaced simple supracondylar fracture without intercondylar fracture of left humerus, initial encounter for closed fracture: Secondary | ICD-10-CM | POA: Insufficient documentation

## 2022-01-16 DIAGNOSIS — I10 Essential (primary) hypertension: Secondary | ICD-10-CM | POA: Insufficient documentation

## 2022-01-16 DIAGNOSIS — E119 Type 2 diabetes mellitus without complications: Secondary | ICD-10-CM | POA: Insufficient documentation

## 2022-01-16 DIAGNOSIS — R569 Unspecified convulsions: Secondary | ICD-10-CM | POA: Insufficient documentation

## 2022-01-16 DIAGNOSIS — G4730 Sleep apnea, unspecified: Secondary | ICD-10-CM | POA: Diagnosis not present

## 2022-01-16 HISTORY — DX: Sleep apnea, unspecified: G47.30

## 2022-01-16 HISTORY — DX: Bipolar disorder, unspecified: F31.9

## 2022-01-16 HISTORY — PX: ORIF HUMERUS FRACTURE: SHX2126

## 2022-01-16 HISTORY — DX: Unspecified convulsions: R56.9

## 2022-01-16 LAB — CBC
HCT: 33 % — ABNORMAL LOW (ref 39.0–52.0)
Hemoglobin: 10.5 g/dL — ABNORMAL LOW (ref 13.0–17.0)
MCH: 27.3 pg (ref 26.0–34.0)
MCHC: 31.8 g/dL (ref 30.0–36.0)
MCV: 85.9 fL (ref 80.0–100.0)
Platelets: 226 10*3/uL (ref 150–400)
RBC: 3.84 MIL/uL — ABNORMAL LOW (ref 4.22–5.81)
RDW: 15.6 % — ABNORMAL HIGH (ref 11.5–15.5)
WBC: 2.9 10*3/uL — ABNORMAL LOW (ref 4.0–10.5)
nRBC: 0 % (ref 0.0–0.2)

## 2022-01-16 LAB — GLUCOSE, CAPILLARY
Glucose-Capillary: 96 mg/dL (ref 70–99)
Glucose-Capillary: 114 mg/dL — ABNORMAL HIGH (ref 70–99)
Glucose-Capillary: 97 mg/dL (ref 70–99)

## 2022-01-16 LAB — BASIC METABOLIC PANEL
Anion gap: 10 (ref 5–15)
BUN: 10 mg/dL (ref 6–20)
CO2: 24 mmol/L (ref 22–32)
Calcium: 8.3 mg/dL — ABNORMAL LOW (ref 8.9–10.3)
Chloride: 105 mmol/L (ref 98–111)
Creatinine, Ser: 0.91 mg/dL (ref 0.61–1.24)
GFR, Estimated: >60 mL/min (ref >60)
Glucose, Bld: 91 mg/dL (ref 70–99)
Potassium: 3.8 mmol/L (ref 3.5–5.1)
Sodium: 139 mmol/L (ref 135–145)

## 2022-01-16 SURGERY — OPEN REDUCTION INTERNAL FIXATION (ORIF) DISTAL HUMERUS FRACTURE
Anesthesia: General | Site: Elbow | Laterality: Left

## 2022-01-16 MED ORDER — AMISULPRIDE (ANTIEMETIC) 5 MG/2ML IV SOLN: 10.0000 mg | Freq: Once | INTRAVENOUS | Status: DC | PRN

## 2022-01-16 MED ORDER — MIDAZOLAM HCL 2 MG/2ML IJ SOLN: 2.0000 mg | Freq: Once | INTRAMUSCULAR | Status: AC

## 2022-01-16 MED ORDER — ONDANSETRON HCL 4 MG/2ML IJ SOLN
INTRAMUSCULAR | Status: DC | PRN
  Administered 2022-01-16: 4 mg via INTRAVENOUS

## 2022-01-16 MED ORDER — CEFAZOLIN IN SODIUM CHLORIDE 3-0.9 GM/100ML-% IV SOLN
3.0000 g | INTRAVENOUS | Status: DC
  Filled 2022-01-16: qty 100

## 2022-01-16 MED ORDER — 0.9 % SODIUM CHLORIDE (POUR BTL) OPTIME
TOPICAL | Status: DC | PRN
  Administered 2022-01-16: 1000 mL

## 2022-01-16 MED ORDER — ORAL CARE MOUTH RINSE
15.0000 mL | Freq: Once | OROMUCOSAL | Status: AC
  Administered 2022-01-16: 15 mL via OROMUCOSAL

## 2022-01-16 MED ORDER — FENTANYL CITRATE (PF) 100 MCG/2ML IJ SOLN: 50.0000 ug | Freq: Once | INTRAMUSCULAR | Status: AC

## 2022-01-16 MED ORDER — SUCCINYLCHOLINE CHLORIDE 200 MG/10ML IV SOSY
PREFILLED_SYRINGE | INTRAVENOUS | Status: DC | PRN
  Administered 2022-01-16: 120 mg via INTRAVENOUS

## 2022-01-16 MED ORDER — DEXAMETHASONE SODIUM PHOSPHATE 10 MG/ML IJ SOLN
INTRAMUSCULAR | Status: AC
  Filled 2022-01-16: qty 1

## 2022-01-16 MED ORDER — PROPOFOL 10 MG/ML IV BOLUS
INTRAVENOUS | Status: AC
  Filled 2022-01-16: qty 20

## 2022-01-16 MED ORDER — VANCOMYCIN HCL 1000 MG IV SOLR
INTRAVENOUS | Status: DC | PRN
  Administered 2022-01-16: 1000 mg

## 2022-01-16 MED ORDER — PROPOFOL 10 MG/ML IV BOLUS
INTRAVENOUS | Status: DC | PRN
  Administered 2022-01-16: 50 mg via INTRAVENOUS
  Administered 2022-01-16: 150 mg via INTRAVENOUS

## 2022-01-16 MED ORDER — LIDOCAINE 2% (20 MG/ML) 5 ML SYRINGE
INTRAMUSCULAR | Status: DC | PRN
  Administered 2022-01-16: 20 mg via INTRAVENOUS

## 2022-01-16 MED ORDER — VANCOMYCIN HCL 1000 MG IV SOLR
INTRAVENOUS | Status: AC
  Filled 2022-01-16: qty 20

## 2022-01-16 MED ORDER — DEXAMETHASONE SODIUM PHOSPHATE 10 MG/ML IJ SOLN
INTRAMUSCULAR | Status: DC | PRN
  Administered 2022-01-16: 5 mg via INTRAVENOUS

## 2022-01-16 MED ORDER — LACTATED RINGERS IV SOLN: INTRAVENOUS | Status: DC

## 2022-01-16 MED ORDER — PHENYLEPHRINE HCL-NACL 20-0.9 MG/250ML-% IV SOLN
INTRAVENOUS | Status: DC | PRN
  Administered 2022-01-16: 15 ug/min via INTRAVENOUS
  Administered 2022-01-16: 25 ug/min via INTRAVENOUS

## 2022-01-16 MED ORDER — ROCURONIUM BROMIDE 10 MG/ML (PF) SYRINGE
PREFILLED_SYRINGE | INTRAVENOUS | Status: AC
  Filled 2022-01-16: qty 10

## 2022-01-16 MED ORDER — FENTANYL CITRATE (PF) 250 MCG/5ML IJ SOLN
INTRAMUSCULAR | Status: DC | PRN
  Administered 2022-01-16: 50 ug via INTRAVENOUS

## 2022-01-16 MED ORDER — DEXTROSE 5 % IV SOLN
INTRAVENOUS | Status: DC | PRN
  Administered 2022-01-16: 3 g via INTRAVENOUS

## 2022-01-16 MED ORDER — LABETALOL HCL 5 MG/ML IV SOLN
INTRAVENOUS | Status: AC
  Administered 2022-01-16: 10 mg via INTRAVENOUS
  Filled 2022-01-16: qty 4

## 2022-01-16 MED ORDER — OXYCODONE HCL 5 MG PO TABS: 5.0000 mg | ORAL_TABLET | Freq: Once | ORAL | Status: DC | PRN

## 2022-01-16 MED ORDER — TIZANIDINE HCL 4 MG PO TABS: 2.0000 mg | ORAL_TABLET | Freq: Three times a day (TID) | ORAL | 0 refills | Status: DC | PRN

## 2022-01-16 MED ORDER — FENTANYL CITRATE (PF) 100 MCG/2ML IJ SOLN: 25.0000 ug | INTRAMUSCULAR | Status: DC | PRN

## 2022-01-16 MED ORDER — MIDAZOLAM HCL 2 MG/2ML IJ SOLN
INTRAMUSCULAR | Status: AC
  Filled 2022-01-16: qty 2

## 2022-01-16 MED ORDER — HYDRALAZINE HCL 10 MG PO TABS
10.0000 mg | ORAL_TABLET | Freq: Once | ORAL | Status: AC
  Administered 2022-01-16: 10 mg via ORAL
  Filled 2022-01-16: qty 1

## 2022-01-16 MED ORDER — ROCURONIUM BROMIDE 10 MG/ML (PF) SYRINGE
PREFILLED_SYRINGE | INTRAVENOUS | Status: DC | PRN
  Administered 2022-01-16: 50 mg via INTRAVENOUS
  Administered 2022-01-16: 10 mg via INTRAVENOUS
  Administered 2022-01-16: 20 mg via INTRAVENOUS

## 2022-01-16 MED ORDER — OXYCODONE HCL 5 MG/5ML PO SOLN: 5.0000 mg | Freq: Once | ORAL | Status: DC | PRN

## 2022-01-16 MED ORDER — MIDAZOLAM HCL 2 MG/2ML IJ SOLN
INTRAMUSCULAR | Status: AC
  Administered 2022-01-16: 2 mg via INTRAVENOUS
  Filled 2022-01-16: qty 2

## 2022-01-16 MED ORDER — CLONIDINE HCL (ANALGESIA) 100 MCG/ML EP SOLN
EPIDURAL | Status: DC | PRN
  Administered 2022-01-16: 50 ug

## 2022-01-16 MED ORDER — FENTANYL CITRATE (PF) 100 MCG/2ML IJ SOLN
INTRAMUSCULAR | Status: AC
  Administered 2022-01-16: 50 ug via INTRAVENOUS
  Filled 2022-01-16: qty 2

## 2022-01-16 MED ORDER — ROPIVACAINE HCL 5 MG/ML IJ SOLN
INTRAMUSCULAR | Status: DC | PRN
  Administered 2022-01-16: 30 mL via PERINEURAL

## 2022-01-16 MED ORDER — CHLORHEXIDINE GLUCONATE 0.12 % MT SOLN: 15.0000 mL | Freq: Once | OROMUCOSAL | Status: AC

## 2022-01-16 MED ORDER — LABETALOL HCL 5 MG/ML IV SOLN: 10.0000 mg | Freq: Once | INTRAVENOUS | Status: AC

## 2022-01-16 MED ORDER — FENTANYL CITRATE (PF) 250 MCG/5ML IJ SOLN
INTRAMUSCULAR | Status: AC
  Filled 2022-01-16: qty 5

## 2022-01-16 MED ORDER — ONDANSETRON HCL 4 MG/2ML IJ SOLN
INTRAMUSCULAR | Status: AC
  Filled 2022-01-16: qty 2

## 2022-01-16 MED ORDER — DEXMEDETOMIDINE HCL IN NACL 80 MCG/20ML IV SOLN
INTRAVENOUS | Status: DC | PRN
  Administered 2022-01-16 (×2): 8 ug via BUCCAL
  Administered 2022-01-16: 4 ug via BUCCAL

## 2022-01-16 MED ORDER — ACETAMINOPHEN 500 MG PO TABS
1000.0000 mg | ORAL_TABLET | Freq: Once | ORAL | Status: AC
  Administered 2022-01-16: 1000 mg via ORAL
  Filled 2022-01-16: qty 2

## 2022-01-16 MED ORDER — LIDOCAINE 2% (20 MG/ML) 5 ML SYRINGE
INTRAMUSCULAR | Status: AC
  Filled 2022-01-16: qty 5

## 2022-01-16 MED ORDER — TRAMADOL HCL 50 MG PO TABS: 25.0000 mg | ORAL_TABLET | Freq: Four times a day (QID) | ORAL | 0 refills | Status: DC | PRN

## 2022-01-16 MED ORDER — SUCCINYLCHOLINE CHLORIDE 200 MG/10ML IV SOSY
PREFILLED_SYRINGE | INTRAVENOUS | Status: AC
  Filled 2022-01-16: qty 10

## 2022-01-16 MED ORDER — SUGAMMADEX SODIUM 200 MG/2ML IV SOLN
INTRAVENOUS | Status: DC | PRN
  Administered 2022-01-16: 511.6 mg via INTRAVENOUS

## 2022-01-16 SURGICAL SUPPLY — 76 items
BAG COUNTER SPONGE SURGICOUNT (BAG) ×1 IMPLANT
BIT DRILL QC 2.0 SHORT EVOS SM (DRILL) IMPLANT
BIT DRILL QC 2.5MM SHRT EVO SM (DRILL) IMPLANT
BNDG COHESIVE 4X5 TAN STRL (GAUZE/BANDAGES/DRESSINGS) ×1 IMPLANT
BNDG ELASTIC 3X5.8 VLCR STR LF (GAUZE/BANDAGES/DRESSINGS) IMPLANT
BNDG ELASTIC 4X5.8 VLCR STR LF (GAUZE/BANDAGES/DRESSINGS) IMPLANT
BNDG GAUZE DERMACEA FLUFF 4 (GAUZE/BANDAGES/DRESSINGS) ×2 IMPLANT
BRUSH SCRUB EZ PLAIN DRY (MISCELLANEOUS) ×2 IMPLANT
CLEANER TIP ELECTROSURG 2X2 (MISCELLANEOUS) ×1 IMPLANT
CORD BIPOLAR FORCEPS 12FT (ELECTRODE) ×1 IMPLANT
COVER SURGICAL LIGHT HANDLE (MISCELLANEOUS) ×1 IMPLANT
DRAPE C-ARM 42X72 X-RAY (DRAPES) ×1 IMPLANT
DRAPE HALF SHEET 40X57 (DRAPES) ×1 IMPLANT
DRAPE INCISE IOBAN 66X45 STRL (DRAPES) IMPLANT
DRAPE ORTHO SPLIT 77X108 STRL (DRAPES) ×1
DRAPE SURG ORHT 6 SPLT 77X108 (DRAPES) ×1 IMPLANT
DRAPE U-SHAPE 47X51 STRL (DRAPES) ×1 IMPLANT
DRILL QC 2.0 SHORT EVOS SM (DRILL) ×1
DRILL QC 2.5MM SHORT EVOS SM (DRILL) ×1
DRSG ADAPTIC 3X8 NADH LF (GAUZE/BANDAGES/DRESSINGS) ×1 IMPLANT
DRSG MEPILEX POST OP 4X12 (GAUZE/BANDAGES/DRESSINGS) IMPLANT
ELECT REM PT RETURN 9FT ADLT (ELECTROSURGICAL) ×1
ELECTRODE REM PT RTRN 9FT ADLT (ELECTROSURGICAL) ×1 IMPLANT
GAUZE PAD ABD 8X10 STRL (GAUZE/BANDAGES/DRESSINGS) ×1 IMPLANT
GAUZE SPONGE 4X4 12PLY STRL (GAUZE/BANDAGES/DRESSINGS) ×2 IMPLANT
GLOVE BIO SURGEON STRL SZ 6.5 (GLOVE) ×3 IMPLANT
GLOVE BIO SURGEON STRL SZ7.5 (GLOVE) ×3 IMPLANT
GLOVE BIOGEL PI IND STRL 6.5 (GLOVE) ×1 IMPLANT
GLOVE BIOGEL PI IND STRL 7.5 (GLOVE) ×1 IMPLANT
GOWN STRL REUS W/ TWL LRG LVL3 (GOWN DISPOSABLE) ×3 IMPLANT
GOWN STRL REUS W/ TWL XL LVL3 (GOWN DISPOSABLE) ×1 IMPLANT
GOWN STRL REUS W/TWL LRG LVL3 (GOWN DISPOSABLE) ×3
GOWN STRL REUS W/TWL XL LVL3 (GOWN DISPOSABLE) ×1
K-WIRE 1.6 (WIRE) ×2
K-WIRE FX150X1.6XTROC PNT (WIRE) ×2
KIT BASIN OR (CUSTOM PROCEDURE TRAY) ×1 IMPLANT
KIT TURNOVER KIT B (KITS) ×1 IMPLANT
KWIRE FX150X1.6XTROC PNT (WIRE) IMPLANT
LOOP VESSEL MAXI BLUE (MISCELLANEOUS) ×1 IMPLANT
LOOP VESSEL MINI RED (MISCELLANEOUS) IMPLANT
MANIFOLD NEPTUNE II (INSTRUMENTS) ×1 IMPLANT
NS IRRIG 1000ML POUR BTL (IV SOLUTION) ×1 IMPLANT
PACK ORTHO EXTREMITY (CUSTOM PROCEDURE TRAY) ×1 IMPLANT
PAD ARMBOARD 7.5X6 YLW CONV (MISCELLANEOUS) ×2 IMPLANT
PAD CAST 3X4 CTTN HI CHSV (CAST SUPPLIES) IMPLANT
PAD CAST 4YDX4 CTTN HI CHSV (CAST SUPPLIES) IMPLANT
PADDING CAST COTTON 3X4 STRL (CAST SUPPLIES) ×1
PADDING CAST COTTON 4X4 STRL (CAST SUPPLIES) ×1
PLATE HUM EVOS 3H L 2.7X80 (Plate) IMPLANT
PLATE HUM EVOS 6H L 2.7X85 (Plate) IMPLANT
SCREW CORT EVOS ST 3.5X28 (Screw) IMPLANT
SCREW CORTEX 3.5X24MM (Screw) IMPLANT
SCREW CORTEX 3.5X26 (Screw) IMPLANT
SCREW EVOS 2.7 X 50 LCK T8 S-T (Screw) IMPLANT
SCREW EVOS 2.7X18 LOCK T8 (Screw) IMPLANT
SCREW LOCK 2.7X38 (Screw) ×1 IMPLANT
SCREW LOCK ST EVOS 2.7X20 (Screw) IMPLANT
SCREW LOCK ST EVOS 2.7X22 (Screw) IMPLANT
SCREW LOCK ST EVOS 2.7X34 (Screw) IMPLANT
SCREW LOCK ST EVOS 2.7X36 (Screw) IMPLANT
SCREW LOCK T8 38X2.7XST (Screw) IMPLANT
SPONGE T-LAP 18X18 ~~LOC~~+RFID (SPONGE) IMPLANT
STOCKINETTE IMPERVIOUS 9X36 MD (GAUZE/BANDAGES/DRESSINGS) ×1 IMPLANT
SUCTION FRAZIER HANDLE 10FR (MISCELLANEOUS) ×1
SUCTION TUBE FRAZIER 10FR DISP (MISCELLANEOUS) ×1 IMPLANT
SUT ETHILON 3 0 FSLX (SUTURE) IMPLANT
SUT ETHILON 3 0 PS 1 (SUTURE) ×2 IMPLANT
SUT VIC AB 0 CT1 27 (SUTURE) ×2
SUT VIC AB 0 CT1 27XBRD ANBCTR (SUTURE) ×2 IMPLANT
SUT VIC AB 2-0 CT1 27 (SUTURE) ×2
SUT VIC AB 2-0 CT1 TAPERPNT 27 (SUTURE) ×2 IMPLANT
SYR CONTROL 10ML LL (SYRINGE) ×1 IMPLANT
TOWEL GREEN STERILE (TOWEL DISPOSABLE) ×1 IMPLANT
TOWEL GREEN STERILE FF (TOWEL DISPOSABLE) ×1 IMPLANT
WATER STERILE IRR 1000ML POUR (IV SOLUTION) ×1 IMPLANT
YANKAUER SUCT BULB TIP NO VENT (SUCTIONS) IMPLANT

## 2022-01-16 NOTE — Anesthesia Preprocedure Evaluation (Signed)
Anesthesia Evaluation  Patient identified by MRN, date of birth, ID band Patient awake    Reviewed: Allergy & Precautions, NPO status , Patient's Chart, lab work & pertinent test results  Airway Mallampati: III  TM Distance: >3 FB Neck ROM: Full    Dental   Pulmonary asthma , sleep apnea , former smoker,    breath sounds clear to auscultation       Cardiovascular hypertension, Pt. on medications  Rhythm:Regular Rate:Normal     Neuro/Psych Seizures -, Well Controlled,   Neuromuscular disease    GI/Hepatic negative GI ROS, Neg liver ROS,   Endo/Other  diabetes, Type 2, Oral Hypoglycemic AgentsMorbid obesity  Renal/GU negative Renal ROS     Musculoskeletal  (+) Arthritis ,   Abdominal   Peds  Hematology negative hematology ROS (+)   Anesthesia Other Findings   Reproductive/Obstetrics                             Anesthesia Physical Anesthesia Plan  ASA: 3  Anesthesia Plan: General   Post-op Pain Management: Regional block*, Tylenol PO (pre-op)* and Toradol IV (intra-op)*   Induction: Intravenous  PONV Risk Score and Plan: 2 and Dexamethasone, Ondansetron and Treatment may vary due to age or medical condition  Airway Management Planned: Oral ETT  Additional Equipment: None  Intra-op Plan:   Post-operative Plan: Extubation in OR  Informed Consent: I have reviewed the patients History and Physical, chart, labs and discussed the procedure including the risks, benefits and alternatives for the proposed anesthesia with the patient or authorized representative who has indicated his/her understanding and acceptance.     Dental advisory given  Plan Discussed with: CRNA  Anesthesia Plan Comments:         Anesthesia Quick Evaluation

## 2022-01-16 NOTE — Anesthesia Postprocedure Evaluation (Signed)
Anesthesia Post Note  Patient: Frank [name].  Procedure(s) Performed: OPEN REDUCTION INTERNAL FIXATION (ORIF) DISTAL HUMERUS FRACTURE (Left: Elbow)     Patient location during evaluation: PACU Anesthesia Type: General Level of consciousness: awake and alert Pain management: pain level controlled Vital Signs Assessment: post-procedure vital signs reviewed and stable Respiratory status: spontaneous breathing, nonlabored ventilation, respiratory function stable and patient connected to nasal cannula oxygen Cardiovascular status: blood pressure returned to baseline and stable Postop Assessment: no apparent nausea or vomiting Anesthetic complications: no   No notable events documented.  Last Vitals:  Vitals:   01/16/22 1600 01/16/22 1615  BP: (!) 166/115 (!) 162/102  Pulse: 72 69  Resp: (!) 26 17  Temp: (!) 36.1 C   SpO2: 90% 94%    Last Pain:  Vitals:   01/16/22 1600  TempSrc:   PainSc: 0-No pain                 Tiajuana Amass

## 2022-01-16 NOTE — Op Note (Signed)
Orthopaedic Surgery Operative Note (CSN: [id number] ) Date of Surgery: 01/16/2022  Admit Date: 01/16/2022   Diagnoses: Pre-Op Diagnoses: Left supracondylar distal humerus fracture  Post-Op Diagnosis: Same  Procedures: CPT 24545-Open reduction internal fixation of left distal humerus fracture  Surgeons : Primary: Shona Needles, MD  Assistant: Patrecia Pace, PA-C  Location: OR 3   Anesthesia:General with regional block   Antibiotics: Ancef 3g preop with 1 gm vancomycin powder placed topically   Tourniquet time:None   Estimated Blood RDEY:814 mL  Complications:None   Specimens:None  Implants: Implant Name Type Inv. Item Serial No. Manufacturer Lot No. LRB No. Used Action  PLATE HUM EVOS 6H L 4.8J85 - [id number] Plate PLATE HUM EVOS 6H L 7.8H88  [employer]  Left 1 Implanted  SCREW CORTEX 3.5X26 - [id number] Screw SCREW CORTEX 3.5X26  [employer]  Left 1 Implanted  SCREW CORTEX 3.5X24MM - [id number] Screw SCREW CORTEX 3.5X24MM  [employer]  Left 1 Implanted  SCREW LOCK ST EVOS 2.7X20 - [id number] Screw SCREW LOCK ST EVOS 2.7X20  [employer]  Left 1 Implanted  SCREW LOCK ST EVOS 2.7X22 - [id number] Screw SCREW LOCK ST EVOS 2.7X22  [employer]  Left 1 Implanted  SCREW EVOS 2.7X18 LOCK T8 - [id number] Screw SCREW EVOS 2.7X18 LOCK T8  [employer]  Left 1 Implanted  PLATE HUM EVOS 5H L 7.0Y174 - [id number] Plate PLATE HUM EVOS 5H L 3.8G665  [employer]  Left 1 Implanted  SCREW EVOS 2.7 X 50 LCK T8 S-T - [id number] Screw SCREW EVOS 2.7 X 50 LCK T8 S-T  [employer]  Left 1 Implanted  SCREW LOCK ST EVOS 2.7X36 - [id number] Screw SCREW LOCK ST EVOS 2.7X36  [employer]  Left 1 Implanted  SCREW LOCK ST EVOS 2.7X34 - [id number] Screw SCREW LOCK ST EVOS 2.7X34  [employer]  Left 1 Implanted  SCREW LOCK 2.7X38 - [id number]  Screw SCREW LOCK 2.7X38  [employer]  Left 1 Implanted     Indications for Surgery: 56 year old male who sustained a left supracondylar distal humerus fracture.  Due to the unstable nature of his injury I recommend proceeding with open reduction internal fixation.  Risk and benefits were discussed with the patient.  Risks include but not limited to bleeding, infection, malunion, nonunion, hardware failure, stiffness, posttraumatic arthritis, hardware irritation, nerve or blood vessel injury, DVT, even the possibility anesthetic complications.  He agrees to proceed with surgery consent was obtained.  Operative Findings: Open reduction internal fixation of left supracondylar distal humerus fracture using [employer] EVOS posterior lateral and direct medial distal humeral plates  Procedure: The patient was identified in the preoperative holding area. Consent was confirmed with the patient and their family and all questions were answered. The operative extremity was marked after confirmation with the patient. he was then brought back to the operating room by our anesthesia colleagues.  He was carefully transferred over to radiolucent flat top table.  He was placed under general anesthetic.  He was placed in the lateral decubitus position with the left side up.  Bony prominences were well-padded.  An axillary roll was placed to keep pressure off of the neurovascular structures.  The left upper extremity was then prepped and draped in usual sterile fashion.  A timeout was performed to verify the patient, the procedure, and the extremity.  Preoperative antibiotics were dosed.  Fluoroscopic imaging showed the unstable nature of his injury.  A direct posterior approach was made and carried down through skin and subcutaneous tissue.  Incised through the triceps fascia and I developed the para tricipital windows both lateral and medial.  I incised through the anconeus to visualize the posterior  capitellum.  Exposed the fracture site and exposed the posterior aspect of the humerus.  I then dissected out the ulnar nerve and carefully protected this throughout the case.  I dissected until I was in the cubital tunnel.  I released the soft tissue along the medial epicondyle as well as the medial condyle for the fracture site.  At this point I then used a reduction tenaculum to anatomically reduce the fracture and confirmed this with fluoroscopy.  Once I have reduction performed I then performed placement of the posterior lateral [employer] EVOS plate.  I held it provisionally with a K wire and confirmed position with fluoroscopy.  I then placed a nonlocking screw into the humeral shaft and then placed 3 locking screws into the capitellum.  I placed another nonlocking screw in the humeral shaft.  I then turned my attention to the medial side I placed a direct distal medial plate held provisionally with a K wire.  I then placed nonlocking screws into the humeral shaft and locking screws in the distal articular block.  I then completed the construct by placing another couple nonlocking screws in both the posterior lateral and direct medial plate.  Final fluoroscopic imaging was obtained.  The incision was copiously irrigated.  A gram of vancomycin powder was placed into the incision.  Layered closure of 0 Vicryl, 2-0 Vicryl and 3-0 nylon was used to close the skin.  A sterile dressing was applied.  The patient was then awoken from anesthesia and taken to the PACU in stable condition.  Post Op Plan/Instructions: Patient will be nonweightbearing to the left upper extremity.  No have unrestricted range of motion of the elbow.  He will be discharged home from PACU.  No DVT prophylaxis is needed in this healthy upper extremity ambulatory patient.  He will follow-up in 2 weeks for x-rays and suture removal.  I was present and performed the entire surgery.  Ulyses Southward, PA-C did assist me throughout the  case. An assistant was necessary given the difficulty in approach, maintenance of reduction and ability to instrument the fracture.   Truitt Merle, MD [facility]

## 2022-01-16 NOTE — Transfer of Care (Signed)
Immediate Anesthesia Transfer of Care Note  Patient: Frank [name].  Procedure(s) Performed: OPEN REDUCTION INTERNAL FIXATION (ORIF) DISTAL HUMERUS FRACTURE (Left: Elbow)  Patient Location: PACU  Anesthesia Type:General  Level of Consciousness: awake and alert   Airway & Oxygen Therapy: Patient Spontanous Breathing  Post-op Assessment: Report given to RN and Post -op Vital signs reviewed and stable  Post vital signs: Reviewed and stable  Last Vitals:  Vitals Value Taken Time  BP 166/115 01/16/22 1600  Temp    Pulse 70 01/16/22 1603  Resp 20 01/16/22 1603  SpO2 91 % 01/16/22 1603  Vitals shown include unvalidated device data.  Last Pain:  Vitals:   01/16/22 1148  TempSrc:   PainSc: 5          Complications: No notable events documented.

## 2022-01-16 NOTE — Anesthesia Procedure Notes (Signed)
Procedure Name: Intubation Date/Time: 01/16/2022 2:10 PM  Performed by: Michele Rockers, CRNAPre-anesthesia Checklist: Patient identified, Patient being monitored, Timeout performed, Emergency Drugs available and Suction available Patient Re-evaluated:Patient Re-evaluated prior to induction Oxygen Delivery Method: Circle system utilized Preoxygenation: Pre-oxygenation with 100% oxygen Induction Type: IV induction and Rapid sequence Laryngoscope Size: Mac, 4 and Glidescope Grade View: Grade I Tube type: Oral Tube size: 7.5 mm Number of attempts: 1 Airway Equipment and Method: Stylet and Video-laryngoscopy Placement Confirmation: ETT inserted through vocal cords under direct vision, positive ETCO2 and breath sounds checked- equal and bilateral Secured at: 22 cm Tube secured with: Tape Dental Injury: Teeth and Oropharynx as per pre-operative assessment  Difficulty Due To: Difficult Airway- due to large tongue, Difficult Airway- due to limited oral opening, Difficult Airway- due to reduced neck mobility and Difficult Airway- due to dentition Comments: Beard and bad teeth

## 2022-01-16 NOTE — Discharge Instructions (Addendum)
[facility] Discharge Instructions   General Discharge Instructions  WEIGHT BEARING STATUS:NON-WEIGHTBEARING LEFT UPPER EXTREMITY  RANGE OF MOTION/ACTIVITY: Ok for shoulder and elbow range of motion as tolerated  Wound Care: You may remove your surgical dressing on post-op day #2 (Friday 01/18/22). Incisions can be left open to air if there is no drainage. If incision continues to have drainage, follow wound care instructions below. Okay to begin showering Saturday, 01/19/22 if no drainage from incisions. Clean incisions gently with soap and water daily  DVT/PE prophylaxis: None  Diet: as you were eating previously.  Can use over the counter stool softeners and bowel preparations, such as Miralax, to help with bowel movements.  Narcotics can be constipating.  Be sure to drink plenty of fluids  PAIN MEDICATION USE AND EXPECTATIONS  You have likely been given narcotic medications to help control your pain.  After a traumatic event that results in an fracture (broken bone) with or without surgery, it is ok to use narcotic pain medications to help control one's pain.  We understand that everyone responds to pain differently and each individual patient will be evaluated on a regular basis for the continued need for narcotic medications. Ideally, narcotic medication use should last no more than 6-8 weeks (coinciding with fracture healing).   As a patient it is your responsibility as well to monitor narcotic medication use and report the amount and frequency you use these medications when you come to your office visit.   We would also advise that if you are using narcotic medications, you should take a dose prior to therapy to maximize you participation.  IF YOU ARE ON NARCOTIC MEDICATIONS IT IS NOT PERMISSIBLE TO OPERATE A MOTOR VEHICLE (MOTORCYCLE/CAR/TRUCK/MOPED) OR HEAVY MACHINERY DO NOT MIX NARCOTICS WITH OTHER CNS (CENTRAL NERVOUS SYSTEM) DEPRESSANTS SUCH AS ALCOHOL   STOP  SMOKING OR USING NICOTINE PRODUCTS!!!!  As discussed nicotine severely impairs your body's ability to heal surgical and traumatic wounds but also impairs bone healing.  Wounds and bone heal by forming microscopic blood vessels (angiogenesis) and nicotine is a vasoconstrictor (essentially, shrinks blood vessels).  Therefore, if vasoconstriction occurs to these microscopic blood vessels they essentially disappear and are unable to deliver necessary nutrients to the healing tissue.  This is one modifiable factor that you can do to dramatically increase your chances of healing your injury.    (This means no smoking, no nicotine gum, patches, etc)  DO NOT USE NONSTEROIDAL ANTI-INFLAMMATORY DRUGS (NSAID'S)  Using products such as Advil (ibuprofen), Aleve (naproxen), Motrin (ibuprofen) for additional pain control during fracture healing can delay and/or prevent the healing response.  If you would like to take over the counter (OTC) medication, Tylenol (acetaminophen) is ok.  However, some narcotic medications that are given for pain control contain acetaminophen as well. Therefore, you should not exceed more than 4000 mg of tylenol in a day if you do not have liver disease.  Also note that there are may OTC medicines, such as cold medicines and allergy medicines that my contain tylenol as well.  If you have any questions about medications and/or interactions please ask your doctor/PA or your pharmacist.      ICE AND ELEVATE INJURED/OPERATIVE EXTREMITY  Using ice and elevating the injured extremity above your heart can help with swelling and pain control.  Icing in a pulsatile fashion, such as 20 minutes on and 20 minutes off, can be followed.    Do not place ice directly on skin. Make sure  there is a barrier between to skin and the ice pack.    Using frozen items such as frozen peas works well as the conform nicely to the are that needs to be iced.  USE AN ACE WRAP OR TED HOSE FOR SWELLING CONTROL  In  addition to icing and elevation, Ace wraps or TED hose are used to help limit and resolve swelling.  It is recommended to use Ace wraps or TED hose until you are informed to stop.    When using Ace Wraps start the wrapping distally (farthest away from the body) and wrap proximally (closer to the body)   Example: If you had surgery on your leg or thing and you do not have a splint on, start the ace wrap at the toes and work your way up to the thigh        If you had surgery on your upper extremity and do not have a splint on, start the ace wrap at your fingers and work your way up to the upper arm   [place]: [phone]   VISIT OUR WEBSITE FOR ADDITIONAL INFORMATION: [email or web address]     Discharge Wound Care Instructions  Do NOT apply any ointments, solutions or lotions to pin sites or surgical wounds.  These prevent needed drainage and even though solutions like hydrogen peroxide kill bacteria, they also damage cells lining the pin sites that help fight infection.  Applying lotions or ointments can keep the wounds moist and can cause them to breakdown and open up as well. This can increase the risk for infection. When in doubt call the office.  If any drainage is noted, use one layer of adaptic or Mepitel, then gauze, Kerlix, and an ace wrap. - These dressing supplies should be available at local medical supply stores [facility], [facility], etc) as well as Management consultant ([facility], [facility], [place], etc)

## 2022-01-16 NOTE — Anesthesia Procedure Notes (Signed)
Anesthesia Regional Block: Supraclavicular block   Pre-Anesthetic Checklist: , timeout performed,  Correct Patient, Correct Site, Correct Laterality,  Correct Procedure, Correct Position, site marked,  Risks and benefits discussed,  Surgical consent,  Pre-op evaluation,  At surgeon's request and post-op pain management  Laterality: Left  Prep: chloraprep       Needles:  Injection technique: Single-shot  Needle Type: Echogenic Stimulator Needle     Needle Length: 9cm  Needle Gauge: 21     Additional Needles:   Procedures:, nerve stimulator,,, ultrasound used (permanent image in chart),,     Nerve Stimulator or Paresthesia:  Response: triceps, biceps, 0.5 mA  Additional Responses:   Narrative:  Start time: 01/16/2022 12:39 PM End time: 01/16/2022 12:45 PM Injection made incrementally with aspirations every 5 mL.  Performed by: Personally  Anesthesiologist: Suzette Battiest, MD

## 2022-01-16 NOTE — Interval H&P Note (Signed)
History and Physical Interval Note:  01/16/2022 12:31 PM  Frank [name].  has presented today for surgery, with the diagnosis of Left distal humerus fracture.  The various methods of treatment have been discussed with the patient and family. After consideration of risks, benefits and other options for treatment, the patient has consented to  Procedure(s): OPEN REDUCTION INTERNAL FIXATION (ORIF) DISTAL HUMERUS FRACTURE (Left) as a surgical intervention.  The patient's history has been reviewed, patient examined, no change in status, stable for surgery.  I have reviewed the patient's chart and labs.  Questions were answered to the patient's satisfaction.     Lennette Bihari P Nastashia Gallo

## 2022-01-18 ENCOUNTER — Encounter (HOSPITAL_COMMUNITY): Payer: Self-pay | Admitting: Student

## 2022-05-23 ENCOUNTER — Other Ambulatory Visit (HOSPITAL_COMMUNITY): Payer: Self-pay | Admitting: Student

## 2022-05-23 ENCOUNTER — Encounter: Payer: Self-pay | Admitting: Radiology

## 2022-05-23 DIAGNOSIS — S42412D Displaced simple supracondylar fracture without intercondylar fracture of left humerus, subsequent encounter for fracture with routine healing: Secondary | ICD-10-CM

## 2022-07-01 ENCOUNTER — Ambulatory Visit (HOSPITAL_COMMUNITY)
Admission: RE | Admit: 2022-07-01 | Discharge: 2022-07-01 | Disposition: A | Discharge status: Home / Self Care | Payer: Medicare PPO | Source: Ambulatory Visit | Attending: Student | Admitting: Student

## 2022-07-01 DIAGNOSIS — S42412D Displaced simple supracondylar fracture without intercondylar fracture of left humerus, subsequent encounter for fracture with routine healing: Secondary | ICD-10-CM | POA: Insufficient documentation

## 2022-07-09 ENCOUNTER — Ambulatory Visit: Payer: Self-pay | Admitting: Student

## 2022-07-23 NOTE — H&P (Signed)
[facility] ([facility]) H&P  Patient ID: Frank [name]. MRN: [id number] DOB/AGE: 58-14-1967 58 y.o.  Reason for Surgery: hardware removal left elbow  HPI: Frank [name]. is an 57 y.o. male with PMH significant for anxiety, tobacco use disorder, T2DM, HTN, HLD presenting for surgery on the left upper extremity. Patient sustained a left distal humerus fracture following a fall in his bathtub on 01/09/22.  He underwent ORIF of left distal humerus by Dr. Jena Gauss on 01/16/2022.  At first postop appointment, patient noted to have broken hardware in his medial plate.  He has been able to progress his mobility and weightbearing to the left upper extremity but continues to have irritation over the medial elbow.  CT scan of the left elbow performed 07/04/2022 which shows significant increased healing of the fracture.  He presents now for hardware removal. Other than aspirin 81 mg daily, not currently on any anticoagulation.  Diabetes well controlled on metformin, last hemoglobin A1c was 6.4 (06/03/2022).   Past Medical History:  Diagnosis Date   Anxiety    Arthritis    Asthma    Bipolar disorder (HCC)    Current every day smoker    Depression    Diabetes mellitus, type II (HCC)    Dyspnea    Hyperlipidemia    Hypertension    Morbid obesity (HCC)    Sedentary lifestyle    Seizures (HCC)    last seizure 8-9 yrs ago, no current problem   Sleep apnea    uses CPAP nightly    Past Surgical History:  Procedure Laterality Date   carpal tunel Left    ORIF HUMERUS FRACTURE Left 01/16/2022   Procedure: OPEN REDUCTION INTERNAL FIXATION (ORIF) DISTAL HUMERUS FRACTURE;  Surgeon: Roby Lofts, MD;  Location: [facility] OR;  Service: Orthopedics;  Laterality: Left;   OTHER SURGICAL HISTORY     R & L shoulder   OTHER SURGICAL HISTORY Right    Knee surgery x several   TOTAL HIP ARTHROPLASTY Left 12/26/2017   Procedure: LEFT TOTAL HIP ARTHROPLASTY ANTERIOR APPROACH;  Surgeon: Kathryne Hitch, MD;  Location: [facility] ORS;  Service: Orthopedics;  Laterality: Left;   TOTAL KNEE ARTHROPLASTY Left 11/23/2021   Procedure: LEFT TOTAL KNEE ARTHROPLASTY;  Surgeon: Kathryne Hitch, MD;  Location: [facility] ORS;  Service: Orthopedics;  Laterality: Left;    Family History  Problem Relation Age of Onset   Diabetes Mother    Hypertension Mother    Heart attack Mother    Osteoporosis Mother    Diabetes Father    Hypertension Father    Heart attack Father    Diabetes Sister    Hypertension Sister    Diabetes Brother    Hypertension Brother     Social History:  reports that he has quit smoking. His smoking use included cigarettes. He has a 12.50 pack-year smoking history. He has never used smokeless tobacco. He reports current drug use. Drug: Marijuana. He reports that he does not drink alcohol.  Allergies:  Allergies  Allergen Reactions   Morphine And Related Anaphylaxis   Shellfish Allergy Anaphylaxis   Betadine [Povidone Iodine] Itching   Chlorhexidine Hives    Medications: I have reviewed the patient's current medications. Prior to Admission:  No medications prior to admission.    ROS: Constitutional: No fever or chills Vision: No changes in vision ENT: No difficulty swallowing CV: No chest pain Pulm: No SOB or wheezing GI: No nausea or vomiting GU: No urgency or  inability to hold urine Skin: No poor wound healing Neurologic: No numbness or tingling Psychiatric: No depression or anxiety Heme: No bruising Allergic: No reaction to medications or food   Exam: There were no vitals taken for this visit. General: No acute distress Orientation: Alert and oriented x 4 Mood and Affect: Mood and affect appropriate, pleasant and cooperative Gait: Within normal limits Coordination and balance: Within normal limits  Left upper extremity: Well-healed surgical incision over the posterior elbow. Tenderness and hardware prominence over the medial aspect of the elbow. Less  tenderness laterally. Tolerates gentle elbow ROM. About 5 degrees shy of full extension.  Motor and sensory function intact. Neurovascularly intact.  Right upper extremity: Skin without lesions. No tenderness to palpation. Full painless ROM, full strength in each muscle group without evidence of instability. Motor and sensory function at baseline.  Neurovascularly intact  Medical Decision Making: Data: Imaging: CT scan left elbow shows the second most proximal screw in the medial plate has broken    Labs: No results found for this or any previous visit (from the past 168 hour(s)).   Assessment/Plan: 57 year old male s/p ORIF left distal humerus 01/16/2022  Patient continues to be bothered by his hardware. He has had notable healing of his fracture and at this point I would recommend proceeding with hardware removal. Risks and benefits of the procedure have been discussed with the patient. Risks discussed included bleeding, infection, re-injury or fracture to left humerus, damage to surrounding nerves and blood vessels, continued pain, stiffness, post-traumatic arthritis, compartment syndrome, and even anesthesia complications. Patient states his understanding of these risks and agrees to proceed with surgery. Consent will be obtained. We will plan to discharge the patient home from the PACU post-operatively.    Thompson Caul PA-C [facility] [phone] (office) [email or web address]

## 2022-07-24 ENCOUNTER — Other Ambulatory Visit: Payer: Self-pay

## 2022-07-24 ENCOUNTER — Encounter (HOSPITAL_COMMUNITY): Payer: Self-pay | Admitting: Student

## 2022-07-24 NOTE — Progress Notes (Signed)
SDW call  Patient was given pre-op instructions over the phone. Patient verbalized understanding of instructions provided.    PCP - Dr. Ardean Larsen Cardiologist - Denies Pulmonary: Denies   PPM/ICD - Denies  Chest x-ray - n/a EKG -  DOS 07/26/2022 Stress Test - ECHO -  Cardiac Cath - 12/17/2018  Sleep Study/sleep apnea/CPAP: Diagnosed with sleep apnea and wears CPAP nightly  Type II Diabetic Fasting Blood sugar range: 96-111 How often check sugars: BID  Metformin, Hold the morning of surgery  Blood Thinner Instructions: Denies Aspirin Instructions: Last dose 07/22/2022   ERAS Protcol - Yes, clear liquids until 0430 PRE-SURGERY Ensure or G2-    COVID TEST- n/a    Anesthesia review: Yes. HTN, DM, OSA with CPAP, Seizures, Morbid obesity   Patient denies shortness of breath, fever, cough and chest pain over the phone call  Your procedure is scheduled on Friday Jul 26, 2022  Report to [facility] Main Entrance "A" at 0530 A.M., then check in with the Admitting office.  Call this number if you have problems the morning of surgery:  [phone]   If you have any questions prior to your surgery date call [phone]: Open Monday-Friday 8am-4pm If you experience any cold or flu symptoms such as cough, fever, chills, shortness of breath, etc. between now and your scheduled surgery, please notify us at the above number     Remember:  Do not eat after midnight the night before your surgery  You may drink clear liquids until 0430 the morning of your surgery.   Clear liquids allowed are: Water, Non-Citrus Juices (without pulp), Carbonated Beverages, Clear Tea, Black Coffee ONLY (NO MILK, CREAM OR POWDERED CREAMER of any kind), and Gatorade   Take these medicines the morning of surgery with A SIP OF WATER:  Atoorvastatin, celebrex, paxil, depakote, nexium, advair, gabapentin, hydralazine.   As needed: Albuterol neb/inhaler, flexeril  As of today, STOP taking any Aspirin  (unless otherwise instructed by your surgeon) Aleve, Naproxen, Ibuprofen, Motrin, Advil, Goody's, BC's, all herbal medications, fish oil, and all vitamins.

## 2022-07-26 ENCOUNTER — Ambulatory Visit (HOSPITAL_COMMUNITY): Payer: Medicare PPO

## 2022-07-26 ENCOUNTER — Encounter (HOSPITAL_COMMUNITY)
Admission: RE | Disposition: A | Discharge status: Home / Self Care | Payer: Self-pay | Source: Home / Self Care | Attending: Student

## 2022-07-26 ENCOUNTER — Other Ambulatory Visit: Payer: Self-pay

## 2022-07-26 ENCOUNTER — Ambulatory Visit (HOSPITAL_COMMUNITY)
Admission: RE | Admit: 2022-07-26 | Discharge: 2022-07-26 | Disposition: A | Discharge status: Home / Self Care | Payer: Medicare PPO | Attending: Student | Admitting: Student

## 2022-07-26 ENCOUNTER — Encounter (HOSPITAL_COMMUNITY): Payer: Self-pay | Admitting: Student

## 2022-07-26 ENCOUNTER — Ambulatory Visit (HOSPITAL_COMMUNITY): Payer: Medicare PPO | Admitting: Physician Assistant

## 2022-07-26 DIAGNOSIS — W182XXD Fall in (into) shower or empty bathtub, subsequent encounter: Secondary | ICD-10-CM | POA: Diagnosis not present

## 2022-07-26 DIAGNOSIS — Z09 Encounter for follow-up examination after completed treatment for conditions other than malignant neoplasm: Secondary | ICD-10-CM | POA: Diagnosis not present

## 2022-07-26 DIAGNOSIS — F17210 Nicotine dependence, cigarettes, uncomplicated: Secondary | ICD-10-CM

## 2022-07-26 DIAGNOSIS — E119 Type 2 diabetes mellitus without complications: Secondary | ICD-10-CM | POA: Insufficient documentation

## 2022-07-26 DIAGNOSIS — I10 Essential (primary) hypertension: Secondary | ICD-10-CM | POA: Diagnosis not present

## 2022-07-26 DIAGNOSIS — Z7984 Long term (current) use of oral hypoglycemic drugs: Secondary | ICD-10-CM | POA: Insufficient documentation

## 2022-07-26 DIAGNOSIS — Z87891 Personal history of nicotine dependence: Secondary | ICD-10-CM | POA: Insufficient documentation

## 2022-07-26 DIAGNOSIS — E785 Hyperlipidemia, unspecified: Secondary | ICD-10-CM | POA: Insufficient documentation

## 2022-07-26 DIAGNOSIS — S42412D Displaced simple supracondylar fracture without intercondylar fracture of left humerus, subsequent encounter for fracture with routine healing: Secondary | ICD-10-CM | POA: Insufficient documentation

## 2022-07-26 DIAGNOSIS — Z7982 Long term (current) use of aspirin: Secondary | ICD-10-CM | POA: Diagnosis not present

## 2022-07-26 DIAGNOSIS — Z6835 Body mass index (BMI) 35.0-35.9, adult: Secondary | ICD-10-CM | POA: Diagnosis not present

## 2022-07-26 DIAGNOSIS — S42412A Displaced simple supracondylar fracture without intercondylar fracture of left humerus, initial encounter for closed fracture: Secondary | ICD-10-CM

## 2022-07-26 DIAGNOSIS — T8484XA Pain due to internal orthopedic prosthetic devices, implants and grafts, initial encounter: Secondary | ICD-10-CM | POA: Diagnosis not present

## 2022-07-26 DIAGNOSIS — G4730 Sleep apnea, unspecified: Secondary | ICD-10-CM | POA: Insufficient documentation

## 2022-07-26 DIAGNOSIS — J45909 Unspecified asthma, uncomplicated: Secondary | ICD-10-CM | POA: Diagnosis not present

## 2022-07-26 DIAGNOSIS — F419 Anxiety disorder, unspecified: Secondary | ICD-10-CM | POA: Insufficient documentation

## 2022-07-26 HISTORY — PX: HARDWARE REMOVAL: SHX979

## 2022-07-26 LAB — CBC
HCT: 35.1 % — ABNORMAL LOW (ref 39.0–52.0)
Hemoglobin: 11.1 g/dL — ABNORMAL LOW (ref 13.0–17.0)
MCH: 26.2 pg (ref 26.0–34.0)
MCHC: 31.6 g/dL (ref 30.0–36.0)
MCV: 82.8 fL (ref 80.0–100.0)
Platelets: 279 10*3/uL (ref 150–400)
RBC: 4.24 MIL/uL (ref 4.22–5.81)
RDW: 18.1 % — ABNORMAL HIGH (ref 11.5–15.5)
WBC: 6.4 10*3/uL (ref 4.0–10.5)
nRBC: 0 % (ref 0.0–0.2)

## 2022-07-26 LAB — BASIC METABOLIC PANEL
Anion gap: 9 (ref 5–15)
BUN: 13 mg/dL (ref 6–20)
CO2: 25 mmol/L (ref 22–32)
Calcium: 8.5 mg/dL — ABNORMAL LOW (ref 8.9–10.3)
Chloride: 101 mmol/L (ref 98–111)
Creatinine, Ser: 1.11 mg/dL (ref 0.61–1.24)
GFR, Estimated: >60 mL/min (ref >60)
Glucose, Bld: 107 mg/dL — ABNORMAL HIGH (ref 70–99)
Potassium: 3.5 mmol/L (ref 3.5–5.1)
Sodium: 135 mmol/L (ref 135–145)

## 2022-07-26 LAB — GLUCOSE, CAPILLARY
Glucose-Capillary: 109 mg/dL — ABNORMAL HIGH (ref 70–99)
Glucose-Capillary: 130 mg/dL — ABNORMAL HIGH (ref 70–99)

## 2022-07-26 SURGERY — REMOVAL, HARDWARE
Anesthesia: General | Laterality: Left

## 2022-07-26 MED ORDER — SUGAMMADEX SODIUM 200 MG/2ML IV SOLN
INTRAVENOUS | Status: DC | PRN
  Administered 2022-07-26: 400 mg via INTRAVENOUS

## 2022-07-26 MED ORDER — OXYCODONE HCL 5 MG PO TABS: 5.0000 mg | ORAL_TABLET | ORAL | 0 refills | Status: DC | PRN

## 2022-07-26 MED ORDER — PHENYLEPHRINE 80 MCG/ML (10ML) SYRINGE FOR IV PUSH (FOR BLOOD PRESSURE SUPPORT)
PREFILLED_SYRINGE | INTRAVENOUS | Status: AC
  Filled 2022-07-26: qty 10

## 2022-07-26 MED ORDER — FENTANYL CITRATE (PF) 250 MCG/5ML IJ SOLN
INTRAMUSCULAR | Status: DC | PRN
  Administered 2022-07-26 (×2): 50 ug via INTRAVENOUS

## 2022-07-26 MED ORDER — FENTANYL CITRATE (PF) 100 MCG/2ML IJ SOLN: 25.0000 ug | INTRAMUSCULAR | Status: DC | PRN

## 2022-07-26 MED ORDER — DEXAMETHASONE SODIUM PHOSPHATE 10 MG/ML IJ SOLN
INTRAMUSCULAR | Status: DC | PRN
  Administered 2022-07-26: 10 mg via INTRAVENOUS

## 2022-07-26 MED ORDER — MIDAZOLAM HCL 2 MG/2ML IJ SOLN
INTRAMUSCULAR | Status: AC
  Filled 2022-07-26: qty 2

## 2022-07-26 MED ORDER — OXYCODONE HCL 5 MG/5ML PO SOLN: 5.0000 mg | Freq: Once | ORAL | Status: DC | PRN

## 2022-07-26 MED ORDER — ROCURONIUM BROMIDE 10 MG/ML (PF) SYRINGE
PREFILLED_SYRINGE | INTRAVENOUS | Status: DC | PRN
  Administered 2022-07-26: 50 mg via INTRAVENOUS

## 2022-07-26 MED ORDER — KETOROLAC TROMETHAMINE 30 MG/ML IJ SOLN: 30.0000 mg | Freq: Once | INTRAMUSCULAR | Status: DC | PRN

## 2022-07-26 MED ORDER — ONDANSETRON HCL 4 MG/2ML IJ SOLN: 4.0000 mg | Freq: Once | INTRAMUSCULAR | Status: DC | PRN

## 2022-07-26 MED ORDER — ONDANSETRON HCL 4 MG/2ML IJ SOLN
INTRAMUSCULAR | Status: DC | PRN
  Administered 2022-07-26: 4 mg via INTRAVENOUS

## 2022-07-26 MED ORDER — MIDAZOLAM HCL 2 MG/2ML IJ SOLN
INTRAMUSCULAR | Status: DC | PRN
  Administered 2022-07-26: 2 mg via INTRAVENOUS

## 2022-07-26 MED ORDER — CEFAZOLIN IN SODIUM CHLORIDE 3-0.9 GM/100ML-% IV SOLN
3.0000 g | INTRAVENOUS | Status: AC
  Administered 2022-07-26: 3 g via INTRAVENOUS
  Filled 2022-07-26: qty 100

## 2022-07-26 MED ORDER — PROPOFOL 10 MG/ML IV BOLUS
INTRAVENOUS | Status: AC
  Filled 2022-07-26: qty 20

## 2022-07-26 MED ORDER — HYDRALAZINE HCL 20 MG/ML IJ SOLN
INTRAMUSCULAR | Status: AC
  Filled 2022-07-26: qty 1

## 2022-07-26 MED ORDER — LACTATED RINGERS IV SOLN: INTRAVENOUS | Status: DC

## 2022-07-26 MED ORDER — VANCOMYCIN HCL 1000 MG IV SOLR
INTRAVENOUS | Status: AC
  Filled 2022-07-26: qty 20

## 2022-07-26 MED ORDER — BUPIVACAINE HCL (PF) 0.5 % IJ SOLN
INTRAMUSCULAR | Status: DC | PRN
  Administered 2022-07-26: 30 mL via PERINEURAL

## 2022-07-26 MED ORDER — VANCOMYCIN HCL 1000 MG IV SOLR
INTRAVENOUS | Status: DC | PRN
  Administered 2022-07-26: 1000 mg via TOPICAL

## 2022-07-26 MED ORDER — 0.9 % SODIUM CHLORIDE (POUR BTL) OPTIME
TOPICAL | Status: DC | PRN
  Administered 2022-07-26: 1000 mL

## 2022-07-26 MED ORDER — HYDRALAZINE HCL 20 MG/ML IJ SOLN
INTRAMUSCULAR | Status: DC | PRN
  Administered 2022-07-26: 10 mg via INTRAVENOUS

## 2022-07-26 MED ORDER — PROPOFOL 10 MG/ML IV BOLUS
INTRAVENOUS | Status: DC | PRN
  Administered 2022-07-26: 180 mg via INTRAVENOUS

## 2022-07-26 MED ORDER — SUCCINYLCHOLINE CHLORIDE 200 MG/10ML IV SOSY
PREFILLED_SYRINGE | INTRAVENOUS | Status: DC | PRN
  Administered 2022-07-26: 160 mg via INTRAVENOUS

## 2022-07-26 MED ORDER — ONDANSETRON HCL 4 MG/2ML IJ SOLN
INTRAMUSCULAR | Status: AC
  Filled 2022-07-26: qty 2

## 2022-07-26 MED ORDER — LIDOCAINE 2% (20 MG/ML) 5 ML SYRINGE
INTRAMUSCULAR | Status: DC | PRN
  Administered 2022-07-26: 100 mg via INTRAVENOUS

## 2022-07-26 MED ORDER — OXYCODONE HCL 5 MG PO TABS: 5.0000 mg | ORAL_TABLET | Freq: Once | ORAL | Status: DC | PRN

## 2022-07-26 MED ORDER — EPHEDRINE SULFATE-NACL 50-0.9 MG/10ML-% IV SOSY
PREFILLED_SYRINGE | INTRAVENOUS | Status: DC | PRN
  Administered 2022-07-26 (×2): 5 mg via INTRAVENOUS

## 2022-07-26 MED ORDER — PHENYLEPHRINE 80 MCG/ML (10ML) SYRINGE FOR IV PUSH (FOR BLOOD PRESSURE SUPPORT)
PREFILLED_SYRINGE | INTRAVENOUS | Status: DC | PRN
  Administered 2022-07-26: 80 ug via INTRAVENOUS
  Administered 2022-07-26: 160 ug via INTRAVENOUS
  Administered 2022-07-26: 80 ug via INTRAVENOUS
  Administered 2022-07-26: 40 ug via INTRAVENOUS
  Administered 2022-07-26 (×2): 80 ug via INTRAVENOUS
  Administered 2022-07-26: 40 ug via INTRAVENOUS
  Administered 2022-07-26: 80 ug via INTRAVENOUS

## 2022-07-26 MED ORDER — FENTANYL CITRATE (PF) 250 MCG/5ML IJ SOLN
INTRAMUSCULAR | Status: AC
  Filled 2022-07-26: qty 5

## 2022-07-26 SURGICAL SUPPLY — 63 items
APL PRP STRL LF DISP 70% ISPRP (MISCELLANEOUS) ×2
BAG COUNTER SPONGE SURGICOUNT (BAG) ×1 IMPLANT
BAG SPNG CNTER NS LX DISP (BAG) ×1
BANDAGE ESMARK 6X9 LF (GAUZE/BANDAGES/DRESSINGS) ×1 IMPLANT
BNDG CMPR 5X6 CHSV STRCH STRL (GAUZE/BANDAGES/DRESSINGS) ×1
BNDG CMPR 9X6 STRL LF SNTH (GAUZE/BANDAGES/DRESSINGS) ×1
BNDG CMPR STD VLCR NS LF 5.8X4 (GAUZE/BANDAGES/DRESSINGS) ×1
BNDG COHESIVE 6X5 TAN ST LF (GAUZE/BANDAGES/DRESSINGS) ×1 IMPLANT
BNDG ELASTIC 4X5.8 VLCR NS LF (GAUZE/BANDAGES/DRESSINGS) IMPLANT
BNDG ELASTIC 4X5.8 VLCR STR LF (GAUZE/BANDAGES/DRESSINGS) ×1 IMPLANT
BNDG ELASTIC 6X5.8 VLCR STR LF (GAUZE/BANDAGES/DRESSINGS) ×1 IMPLANT
BNDG ESMARK 6X9 LF (GAUZE/BANDAGES/DRESSINGS) ×1
BNDG GAUZE DERMACEA FLUFF 4 (GAUZE/BANDAGES/DRESSINGS) ×2 IMPLANT
BNDG GZE DERMACEA 4 6PLY (GAUZE/BANDAGES/DRESSINGS) ×2
BRUSH SCRUB EZ PLAIN DRY (MISCELLANEOUS) ×2 IMPLANT
CHLORAPREP W/TINT 26 (MISCELLANEOUS) ×1 IMPLANT
COVER SURGICAL LIGHT HANDLE (MISCELLANEOUS) ×2 IMPLANT
DRAPE C-ARM 42X72 X-RAY (DRAPES) IMPLANT
DRAPE C-ARMOR (DRAPES) ×1 IMPLANT
DRAPE U-SHAPE 47X51 STRL (DRAPES) ×1 IMPLANT
DRSG ADAPTIC 3X8 NADH LF (GAUZE/BANDAGES/DRESSINGS) ×1 IMPLANT
DRSG MEPITEL 8X12 (GAUZE/BANDAGES/DRESSINGS) IMPLANT
ELECT REM PT RETURN 9FT ADLT (ELECTROSURGICAL) ×1
ELECTRODE REM PT RTRN 9FT ADLT (ELECTROSURGICAL) ×1 IMPLANT
GAUZE SPONGE 4X4 12PLY STRL (GAUZE/BANDAGES/DRESSINGS) ×1 IMPLANT
GLOVE BIO SURGEON STRL SZ 6.5 (GLOVE) ×3 IMPLANT
GLOVE BIO SURGEON STRL SZ7.5 (GLOVE) ×4 IMPLANT
GLOVE BIOGEL PI IND STRL 6.5 (GLOVE) ×1 IMPLANT
GLOVE BIOGEL PI IND STRL 7.5 (GLOVE) ×1 IMPLANT
GOWN STRL REUS W/ TWL LRG LVL3 (GOWN DISPOSABLE) ×2 IMPLANT
GOWN STRL REUS W/TWL LRG LVL3 (GOWN DISPOSABLE) ×3
KIT BASIN OR (CUSTOM PROCEDURE TRAY) ×1 IMPLANT
KIT TURNOVER KIT B (KITS) ×1 IMPLANT
MANIFOLD NEPTUNE II (INSTRUMENTS) ×1 IMPLANT
NDL 22X1.5 STRL (OR ONLY) (MISCELLANEOUS) IMPLANT
NEEDLE 22X1.5 STRL (OR ONLY) (MISCELLANEOUS) IMPLANT
NS IRRIG 1000ML POUR BTL (IV SOLUTION) ×1 IMPLANT
PACK ORTHO EXTREMITY (CUSTOM PROCEDURE TRAY) ×1 IMPLANT
PAD ARMBOARD 7.5X6 YLW CONV (MISCELLANEOUS) ×2 IMPLANT
PADDING CAST ABS COTTON 4X4 ST (CAST SUPPLIES) IMPLANT
PADDING CAST COTTON 6X4 STRL (CAST SUPPLIES) ×3 IMPLANT
SLING ARM FOAM STRAP XLG (SOFTGOODS) IMPLANT
SPONGE T-LAP 18X18 ~~LOC~~+RFID (SPONGE) ×1 IMPLANT
STAPLER VISISTAT 35W (STAPLE) IMPLANT
STOCKINETTE IMPERVIOUS LG (DRAPES) ×1 IMPLANT
STRIP CLOSURE SKIN 1/2X4 (GAUZE/BANDAGES/DRESSINGS) IMPLANT
SUCTION FRAZIER HANDLE 10FR (MISCELLANEOUS)
SUCTION TUBE FRAZIER 10FR DISP (MISCELLANEOUS) IMPLANT
SUT ETHILON 3 0 PS 1 (SUTURE) IMPLANT
SUT MNCRL AB 3-0 PS2 18 (SUTURE) ×1 IMPLANT
SUT MON AB 2-0 CT1 36 (SUTURE) ×1 IMPLANT
SUT PDS AB 2-0 CT1 27 (SUTURE) IMPLANT
SUT VIC AB 0 CT1 27 (SUTURE)
SUT VIC AB 0 CT1 27XBRD ANBCTR (SUTURE) IMPLANT
SUT VIC AB 2-0 CT1 27 (SUTURE)
SUT VIC AB 2-0 CT1 TAPERPNT 27 (SUTURE) IMPLANT
SYR CONTROL 10ML LL (SYRINGE) IMPLANT
TOWEL GREEN STERILE (TOWEL DISPOSABLE) ×2 IMPLANT
TOWEL GREEN STERILE FF (TOWEL DISPOSABLE) ×2 IMPLANT
TUBE CONNECTING 12X1/4 (SUCTIONS) ×1 IMPLANT
UNDERPAD 30X36 HEAVY ABSORB (UNDERPADS AND DIAPERS) ×1 IMPLANT
WATER STERILE IRR 1000ML POUR (IV SOLUTION) ×2 IMPLANT
YANKAUER SUCT BULB TIP NO VENT (SUCTIONS) ×1 IMPLANT

## 2022-07-26 NOTE — Anesthesia Preprocedure Evaluation (Signed)
Anesthesia Evaluation  Patient identified by MRN, date of birth, ID band Patient awake    Reviewed: Allergy & Precautions, H&P , NPO status , Patient's Chart, lab work & pertinent test results  Airway Mallampati: III  TM Distance: <3 FB Neck ROM: Full    Dental no notable dental hx.    Pulmonary asthma , sleep apnea , Current Smoker and Patient abstained from smoking.   breath sounds clear to auscultation + decreased breath sounds      Cardiovascular hypertension, Normal cardiovascular exam Rhythm:Regular Rate:Normal     Neuro/Psych negative neurological ROS  negative psych ROS   GI/Hepatic negative GI ROS, Neg liver ROS,,,  Endo/Other  diabetes  Morbid obesity  Renal/GU negative Renal ROS  negative genitourinary   Musculoskeletal negative musculoskeletal ROS (+)    Abdominal  (+) + obese  Peds negative pediatric ROS (+)  Hematology negative hematology ROS (+)   Anesthesia Other Findings   Reproductive/Obstetrics negative OB ROS                             Anesthesia Physical Anesthesia Plan  ASA: 3  Anesthesia Plan: General   Post-op Pain Management: Regional block*   Induction: Intravenous  PONV Risk Score and Plan: 1 and Ondansetron, Dexamethasone and Treatment may vary due to age or medical condition  Airway Management Planned: Oral ETT and Video Laryngoscope Planned  Additional Equipment:   Intra-op Plan:   Post-operative Plan: Extubation in OR  Informed Consent: I have reviewed the patients History and Physical, chart, labs and discussed the procedure including the risks, benefits and alternatives for the proposed anesthesia with the patient or authorized representative who has indicated his/her understanding and acceptance.     Dental advisory given  Plan Discussed with: CRNA and Surgeon  Anesthesia Plan Comments:        Anesthesia Quick Evaluation

## 2022-07-26 NOTE — Anesthesia Postprocedure Evaluation (Signed)
Anesthesia Post Note  Patient: Frank [name].  Procedure(s) Performed: HARDWARE REMOVAL ELBOW (Left)     Patient location during evaluation: PACU Anesthesia Type: General Level of consciousness: awake and alert Pain management: pain level controlled Vital Signs Assessment: post-procedure vital signs reviewed and stable Respiratory status: spontaneous breathing, nonlabored ventilation, respiratory function stable and patient connected to nasal cannula oxygen Cardiovascular status: blood pressure returned to baseline and stable Postop Assessment: no apparent nausea or vomiting Anesthetic complications: no  No notable events documented.  Last Vitals:  Vitals:   07/26/22 0930 07/26/22 0945  BP: (!) 117/98 (!) 128/95  Pulse: 76 74  Resp: 20 (!) 21  Temp:    SpO2: 92% 91%    Last Pain:  Vitals:   07/26/22 0945  TempSrc:   PainSc: 0-No pain                 Tamya Denardo S

## 2022-07-26 NOTE — Op Note (Signed)
Orthopaedic Surgery Operative Note (CSN: [id number] ) Date of Surgery: 07/26/2022  Admit Date: 07/26/2022   Diagnoses: Pre-Op Diagnoses: Left supracondylar humerus fracture Left elbow hardware prominence  Post-Op Diagnosis: Same  Procedures: CPT 20680-Removal of hardware from left elbow  Surgeons : Primary: Roby Lofts, MD  Assistant: Ulyses Southward, PA-C  Location: OR 3   Anesthesia: General with regional block   Antibiotics: Ancef 2g preop with 1 gm vancomycin powder placed topically   Tourniquet time: None    Estimated Blood Loss: 200 mL  Complications:None   Specimens:None   Implants: * No implants in log *   Indications for Surgery: 57 year old male who underwent open reduction internal fixation in November 2023.  He subsequently healed his fracture but developed hardware prominence due to some loosening early in the postoperative period.  Due to his persistent pain and discomfort a CT scan was obtained which showed the fracture appears to be healed and I recommended proceeding with hardware removal.  Risks and benefits were discussed with the patient.  Risks included but not limited to bleeding, infection, malunion, nonunion, persistent fracture, persistent pain, ulnar nerve injury, even the possibility of continued elbow stiffness and anesthetic complications.  He agreed to proceed with surgery and consent was obtained.  Operative Findings: Successful removal of supracondylar humerus hardware without complication.  Procedure: The patient was identified in the preoperative holding area. Consent was confirmed with the patient and their family and all questions were answered. The operative extremity was marked after confirmation with the patient. he was then brought back to the operating room by our anesthesia colleagues.  He was placed under general anesthetic and carefully transferred over to radiolucent flattop table.  He was placed in the lateral decubitus position  with the left side up.  An axillary roll was placed to keep the pressure off of his neurovascular structures in the right upper extremity.  The left upper extremity was then prepped and draped in usual sterile fashion.  A timeout was performed to verify the patient, the procedure, and the extremity.  Preoperative antibiotics were dosed.  Fluoroscopic imaging showed the hardware that was in place.  I reopened the posterior incision and carried it down through skin and subcutaneous tissue identified the triceps and developed the planes on the lateral and medial side.  I for started out the lateral side and in the intramuscular plane I carefully dissected and performed blunt dissection down to the plate.  I then removed all of the nonlocking and locking screws from proximal and distal of the lateral plate.  The lateral plate was then removed with a Cobb elevator and this was done without difficulty or complication.  I then turned my attention to the medial side.  I took care to identify the ulnar nerve and carefully dissected this out as much as needed for accessing the plate.  Distally I was able to incise through some fibrous tissue and find the distal screws and remove these without difficulty.  I then used fluoroscopic imaging to assist with identifying the screws proximally in the humeral shaft.  I was able to remove some of the heterotopic bone to remove the remainder of the screws.  There was 1 screw that was broken and I left the shaft of the screw in the humerus as trying to get it out with cause undue damage that was not necessary in my opinion.  Final fluoroscopic imaging was obtained.  The incision was copiously irrigated.  A gram of vancomycin powder  was placed to the incision.  A layered closure of 0 Vicryl, 2-0 Vicryl and 3-0 nylon was used to close the skin.  Sterile dressings were applied.  The patient was then awoken from anesthesia and taken to the PACU in stable condition.  Post Op  Plan/Instructions: Patient will be weightbearing as tolerated to the left upper extremity.  He will discharge from the PACU.  Have unrestricted range of motion of the elbow.  Level follow-up in approximately 2 weeks for x-rays and suture removal.  I was present and performed the entire surgery.  Ulyses Southward, PA-C did assist me throughout the case. An assistant was necessary given the difficulty in approach, maintenance of reduction and ability to instrument the fracture.   Truitt Merle, MD [facility]

## 2022-07-26 NOTE — Progress Notes (Signed)
Dr. Okey Dupre made aware of patient's consistent elevated BP readings this morning. See vital signs charting. No new orders received at this time.

## 2022-07-26 NOTE — Interval H&P Note (Signed)
History and Physical Interval Note:  07/26/2022 7:17 AM  Frank [name].  has presented today for surgery, with the diagnosis of Left elbow pain.  The various methods of treatment have been discussed with the patient and family. After consideration of risks, benefits and other options for treatment, the patient has consented to  Procedure(s): HARDWARE REMOVAL ELBOW (Left) as a surgical intervention.  The patient's history has been reviewed, patient examined, no change in status, stable for surgery.  I have reviewed the patient's chart and labs.  Questions were answered to the patient's satisfaction.     Caryn Bee P Sidonie Dexheimer

## 2022-07-26 NOTE — Discharge Instructions (Addendum)
[facility] Discharge Instructions   General Discharge Instructions  WEIGHT BEARING STATUS: Weightbearing left lower extremity  RANGE OF MOTION/ACTIVITY: Ok for elbow range of motion as tolerated. Use sling as needed for comfort  Wound Care: You may remove your surgical dressing on post-op day #2, (Sunday 07/28/22). Incisions can be left open to air if there is no drainage. Once the incision is completely dry and without drainage, it may be left open to air out.  Showering may begin post-op day 33, (Monday 07/29/22).  Clean incision gently with soap and water.  DVT/PE prophylaxis: Continue your home dose aspirin  Diet: as you were eating previously.  Can use over the counter stool softeners and bowel preparations, such as Miralax, to help with bowel movements.  Narcotics can be constipating.  Be sure to drink plenty of fluids  PAIN MEDICATION USE AND EXPECTATIONS  You have likely been given narcotic medications to help control your pain.  After a traumatic event that results in an fracture (broken bone) with or without surgery, it is ok to use narcotic pain medications to help control one's pain.  We understand that everyone responds to pain differently and each individual patient will be evaluated on a regular basis for the continued need for narcotic medications. Ideally, narcotic medication use should last no more than 6-8 weeks (coinciding with fracture healing).   As a patient it is your responsibility as well to monitor narcotic medication use and report the amount and frequency you use these medications when you come to your office visit.   We would also advise that if you are using narcotic medications, you should take a dose prior to therapy to maximize you participation.  IF YOU ARE ON NARCOTIC MEDICATIONS IT IS NOT PERMISSIBLE TO OPERATE A MOTOR VEHICLE (MOTORCYCLE/CAR/TRUCK/MOPED) OR HEAVY MACHINERY DO NOT MIX NARCOTICS WITH OTHER CNS (CENTRAL NERVOUS SYSTEM)  DEPRESSANTS SUCH AS ALCOHOL   STOP SMOKING OR USING NICOTINE PRODUCTS!!!!  As discussed nicotine severely impairs your body's ability to heal surgical and traumatic wounds but also impairs bone healing.  Wounds and bone heal by forming microscopic blood vessels (angiogenesis) and nicotine is a vasoconstrictor (essentially, shrinks blood vessels).  Therefore, if vasoconstriction occurs to these microscopic blood vessels they essentially disappear and are unable to deliver necessary nutrients to the healing tissue.  This is one modifiable factor that you can do to dramatically increase your chances of healing your injury.    (This means no smoking, no nicotine gum, patches, etc)  DO NOT USE NONSTEROIDAL ANTI-INFLAMMATORY DRUGS (NSAID'S)  Using products such as Advil (ibuprofen), Aleve (naproxen), Motrin (ibuprofen) for additional pain control during fracture healing can delay and/or prevent the healing response.  If you would like to take over the counter (OTC) medication, Tylenol (acetaminophen) is ok.  However, some narcotic medications that are given for pain control contain acetaminophen as well. Therefore, you should not exceed more than 4000 mg of tylenol in a day if you do not have liver disease.  Also note that there are may OTC medicines, such as cold medicines and allergy medicines that my contain tylenol as well.  If you have any questions about medications and/or interactions please ask your doctor/PA or your pharmacist.      ICE AND ELEVATE INJURED/OPERATIVE EXTREMITY  Using ice and elevating the injured extremity above your heart can help with swelling and pain control.  Icing in a pulsatile fashion, such as 20 minutes on and 20 minutes off, can be  followed.    Do not place ice directly on skin. Make sure there is a barrier between to skin and the ice pack.    Using frozen items such as frozen peas works well as the conform nicely to the are that needs to be iced.  USE AN ACE WRAP OR TED  HOSE FOR SWELLING CONTROL  In addition to icing and elevation, Ace wraps or TED hose are used to help limit and resolve swelling.  It is recommended to use Ace wraps or TED hose until you are informed to stop.    When using Ace Wraps start the wrapping distally (farthest away from the body) and wrap proximally (closer to the body)   Example: If you had surgery on your leg or thing and you do not have a splint on, start the ace wrap at the toes and work your way up to the thigh        If you had surgery on your upper extremity and do not have a splint on, start the ace wrap at your fingers and work your way up to the upper arm   CALL THE OFFICE WITH ANY QUESTIONS OR CONCERNS: [phone]   VISIT OUR WEBSITE FOR ADDITIONAL INFORMATION: [email or web address]    Discharge Wound Care Instructions  Do NOT apply any ointments, solutions or lotions to pin sites or surgical wounds.  These prevent needed drainage and even though solutions like hydrogen peroxide kill bacteria, they also damage cells lining the pin sites that help fight infection.  Applying lotions or ointments can keep the wounds moist and can cause them to breakdown and open up as well. This can increase the risk for infection. When in doubt call the office.  Surgical incisions should be dressed daily.  If any drainage is noted, use one layer of adaptic or Mepitel, then gauze, Kerlix, and an ace wrap. - These dressing supplies should be available at local medical supply stores [facility], [facility], etc) as well as Insurance claims handler ([employer], [facility], [employer], etc)  Once the incision is completely dry and without drainage, it may be left open to air out.  Showering may begin 36-48 hours later.  Cleaning gently with soap and water.  Traumatic wounds should be dressed daily as well.    One layer of adaptic, gauze, Kerlix, then ace wrap.  The adaptic can be discontinued once the draining has ceased    If you have a wet to dry  dressing: wet the gauze with saline the squeeze as much saline out so the gauze is moist (not soaking wet), place moistened gauze over wound, then place a dry gauze over the moist one, followed by Kerlix wrap, then ace wrap.

## 2022-07-26 NOTE — Anesthesia Procedure Notes (Signed)
Anesthesia Procedure Image    

## 2022-07-26 NOTE — Transfer of Care (Signed)
Immediate Anesthesia Transfer of Care Note  Patient: Frank [name].  Procedure(s) Performed: HARDWARE REMOVAL ELBOW (Left)  Patient Location: PACU  Anesthesia Type:General and Regional  Level of Consciousness: awake and alert   Airway & Oxygen Therapy: Patient Spontanous Breathing  Post-op Assessment: Report given to RN and Post -op Vital signs reviewed and stable  Post vital signs: Reviewed and stable  Last Vitals:  Vitals Value Taken Time  BP 151/107 07/26/22 0917  Temp    Pulse 83 07/26/22 0919  Resp 18 07/26/22 0919  SpO2 93 % 07/26/22 0919  Vitals shown include unvalidated device data.  Last Pain:  Vitals:   07/26/22 0629  TempSrc: Oral  PainSc: 8       Patients Stated Pain Goal: 3 (07/26/22 1610)  Complications: No notable events documented.

## 2022-07-26 NOTE — Anesthesia Procedure Notes (Addendum)
Procedure Name: Intubation Date/Time: 07/26/2022 7:39 AM  Performed by: Maxine Glenn, CRNAPre-anesthesia Checklist: Patient identified, Emergency Drugs available, Suction available and Patient being monitored Patient Re-evaluated:Patient Re-evaluated prior to induction Oxygen Delivery Method: Circle System Utilized Preoxygenation: Pre-oxygenation with 100% oxygen Induction Type: IV induction Ventilation: Mask ventilation without difficulty Laryngoscope Size: Glidescope and 4 Grade View: Grade I Tube type: Oral Tube size: 7.5 mm Number of attempts: 1 Airway Equipment and Method: Video-laryngoscopy and Rigid stylet Placement Confirmation: ETT inserted through vocal cords under direct vision, positive ETCO2 and breath sounds checked- equal and bilateral Secured at: 23 cm Tube secured with: Tape Dental Injury: Teeth and Oropharynx as per pre-operative assessment

## 2022-07-26 NOTE — Anesthesia Procedure Notes (Signed)
Anesthesia Regional Block: Supraclavicular block   Pre-Anesthetic Checklist: , timeout performed,  Correct Patient, Correct Site, Correct Laterality,  Correct Procedure, Correct Position, site marked,  Risks and benefits discussed,  Surgical consent,  Pre-op evaluation,  At surgeon's request and post-op pain management  Laterality: Left  Prep: chloraprep       Needles:  Injection technique: Single-shot  Needle Type: Echogenic Needle     Needle Length: 9cm      Additional Needles:   Procedures:,,,, ultrasound used (permanent image in chart),,    Narrative:  Start time: 07/26/2022 6:44 AM End time: 07/26/2022 6:55 AM Injection made incrementally with aspirations every 5 mL.  Performed by: Personally  Anesthesiologist: Eilene Ghazi, MD  Additional Notes: Patient tolerated the procedure well without complications

## 2022-07-27 ENCOUNTER — Encounter (HOSPITAL_COMMUNITY): Payer: Self-pay | Admitting: Student

## 2023-01-24 ENCOUNTER — Other Ambulatory Visit: Payer: Self-pay

## 2023-01-24 ENCOUNTER — Emergency Department (HOSPITAL_COMMUNITY)
Admission: EM | Admit: 2023-01-24 | Discharge: 2023-01-25 | Disposition: A | Discharge status: Home / Self Care | Payer: Medicare PPO | Attending: Emergency Medicine | Admitting: Emergency Medicine

## 2023-01-24 ENCOUNTER — Encounter (HOSPITAL_COMMUNITY): Payer: Self-pay

## 2023-01-24 ENCOUNTER — Emergency Department (HOSPITAL_COMMUNITY): Payer: Medicare PPO

## 2023-01-24 DIAGNOSIS — J45909 Unspecified asthma, uncomplicated: Secondary | ICD-10-CM | POA: Diagnosis not present

## 2023-01-24 DIAGNOSIS — Z7984 Long term (current) use of oral hypoglycemic drugs: Secondary | ICD-10-CM | POA: Insufficient documentation

## 2023-01-24 DIAGNOSIS — E119 Type 2 diabetes mellitus without complications: Secondary | ICD-10-CM | POA: Diagnosis not present

## 2023-01-24 DIAGNOSIS — F17210 Nicotine dependence, cigarettes, uncomplicated: Secondary | ICD-10-CM | POA: Diagnosis not present

## 2023-01-24 DIAGNOSIS — Z79899 Other long term (current) drug therapy: Secondary | ICD-10-CM | POA: Diagnosis not present

## 2023-01-24 DIAGNOSIS — S42402A Unspecified fracture of lower end of left humerus, initial encounter for closed fracture: Secondary | ICD-10-CM | POA: Diagnosis not present

## 2023-01-24 DIAGNOSIS — I10 Essential (primary) hypertension: Secondary | ICD-10-CM | POA: Diagnosis not present

## 2023-01-24 DIAGNOSIS — X500XXA Overexertion from strenuous movement or load, initial encounter: Secondary | ICD-10-CM | POA: Diagnosis not present

## 2023-01-24 DIAGNOSIS — Z7982 Long term (current) use of aspirin: Secondary | ICD-10-CM | POA: Diagnosis not present

## 2023-01-24 DIAGNOSIS — S4992XA Unspecified injury of left shoulder and upper arm, initial encounter: Secondary | ICD-10-CM | POA: Diagnosis present

## 2023-01-24 MED ORDER — ACETAMINOPHEN 325 MG PO TABS: 650.0000 mg | ORAL_TABLET | Freq: Four times a day (QID) | ORAL | 0 refills | Status: DC | PRN

## 2023-01-24 MED ORDER — OXYCODONE HCL 5 MG PO TABS: 5.0000 mg | ORAL_TABLET | ORAL | 0 refills | Status: DC | PRN

## 2023-01-24 MED ORDER — OXYCODONE HCL 5 MG PO TABS
5.0000 mg | ORAL_TABLET | Freq: Once | ORAL | Status: AC
  Administered 2023-01-24: 5 mg via ORAL
  Filled 2023-01-24: qty 1

## 2023-01-24 NOTE — ED Triage Notes (Signed)
Pt stated he was picking up a box and felt his arm snap.  LEFT humerus deformed and splinted by wife  PT has prior fx's to the same arm

## 2023-01-24 NOTE — Discharge Instructions (Signed)
Please call Dr. Jena Gauss office on Monday to arrange follow-up  It was a pleasure caring for you today in the emergency department.  Please return to the emergency department for any worsening or worrisome symptoms.

## 2023-01-24 NOTE — ED Provider Notes (Signed)
[facility] AT [facility] Provider Note  CSN: [id number] Arrival date & time: 01/24/23 1950  Chief Complaint(s) Arm Injury  HPI Frank [name]. is a 57 y.o. male with past medical history as below, significant for tobacco use, DM2, HLD, HTN, obesity, sleep apnea on CPAP, prior left TKA, left total hip who presents to the ED with complaint of arm pain.   Reports prior to arrival he was picking up a approximate 10 to 15 pound box and felt a stabbing sensation to his left arm, immediate swelling and discomfort just proximal to his left elbow.  Difficulty moving his arm.  No injuries reported.  Prior humerus fracture on left, Dr. Jena Gauss  Surgery Dr. Jena Gauss 5/24, he had distal left humerus fracture in October of last year, he had ORIF left distal humerus with Dr. Jena Gauss on 11/23, patient had broken hardware on his medial plate and the hardware was removed in May of this year.  Past Medical History Past Medical History:  Diagnosis Date   Anxiety    Arthritis    Asthma    Bipolar disorder (HCC)    Current every day smoker    Depression    Diabetes mellitus, type II (HCC)    Dyspnea    Hyperlipidemia    Hypertension    Morbid obesity (HCC)    Sedentary lifestyle    Seizures (HCC)    last seizure 8-9 yrs ago, no current problem   Sleep apnea    uses CPAP nightly   Patient Active Problem List   Diagnosis Date Noted   Status post total left knee replacement 11/23/2021   Unilateral primary osteoarthritis, right knee 01/25/2019   Carpal tunnel syndrome, left upper limb 10/15/2018   Carpal tunnel syndrome, right upper limb 10/15/2018   Bilateral hand numbness 09/09/2018   Plantar fasciitis of left foot 09/09/2018   Status post total replacement of left hip 12/26/2017   Unilateral primary osteoarthritis, left knee 11/12/2017   Hypogonadism, male 05/08/2016   Home Medication(s) Prior to Admission medications   Medication Sig Start Date End Date  Taking? Authorizing Provider  albuterol (PROVENTIL HFA;VENTOLIN HFA) 108 (90 Base) MCG/ACT inhaler Inhale 2 puffs into the lungs every 6 (six) hours as needed for wheezing or shortness of breath.    [provider]  albuterol (PROVENTIL) (2.5 MG/3ML) 0.083% nebulizer solution 2.5 mg every 2 (two) hours as needed for wheezing or shortness of breath.    [provider]  amLODipine (NORVASC) 10 MG tablet Take 10 mg by mouth at bedtime.    [provider]  aspirin EC 81 MG tablet Take 81 mg by mouth 2 (two) times a week. Swallow whole.    [provider]  atorvastatin (LIPITOR) 40 MG tablet Take 40 mg by mouth daily.    [provider]  busPIRone (BUSPAR) 15 MG tablet Take 15 mg by mouth at bedtime.    [provider]  celecoxib (CELEBREX) 200 MG capsule Take 200 mg by mouth daily. Additional 200 mg if needed a bedtime    [provider]  cyclobenzaprine (FLEXERIL) 5 MG tablet Take 5 mg by mouth 3 (three) times daily as needed for muscle spasms. 03/24/18   [provider]  divalproex (DEPAKOTE) 500 MG DR tablet Take 500 mg by mouth 2 (two) times daily.    [provider]  EPINEPHrine 0.3 mg/0.3 mL IJ SOAJ injection Inject 0.3 mg into the muscle as needed for anaphylaxis. 08/28/21  [provider]  esomeprazole (NEXIUM) 40 MG capsule Take 40 mg by mouth in the morning. Additional 40 mg if needed at bedtime    [provider]  Fluticasone-Salmeterol (ADVAIR) 500-50 MCG/DOSE AEPB Inhale 1 puff into the lungs in the morning.    [provider]  furosemide (LASIX) 40 MG tablet Take 40 mg by mouth daily as needed for fluid or edema.    [provider]  gabapentin (NEURONTIN) 300 MG capsule Take 300-600 mg by mouth See admin instructions. 300 mg in the morning, 600 mg at bedtime    [provider]  hydrALAZINE (APRESOLINE) 10 MG tablet Take 10 mg by mouth 2 (two) times daily.    [provider]  hydrOXYzine (ATARAX) 25 MG tablet Take 25 mg by mouth at bedtime.    [provider]  ibuprofen (ADVIL) 800 MG tablet Take 800 mg by mouth every 8 (eight) hours as needed for mild pain or moderate pain.    [provider]  lisinopril (ZESTRIL) 20 MG tablet Take 20 mg by mouth daily.    [provider]  metFORMIN (GLUCOPHAGE) 1000 MG tablet Take 1,000 mg by mouth 2 (two) times daily with a meal.    [provider]  montelukast (SINGULAIR) 10 MG tablet Take 10 mg by mouth at bedtime.    [provider]  OVER THE COUNTER MEDICATION Take 1 capsule by mouth at bedtime. Burdock Root Supplement    [provider]  oxyCODONE (ROXICODONE) 5 MG immediate release tablet Take 1 tablet (5 mg total) by mouth every 4 (four) hours as needed for severe pain. 07/26/22   [place] Bali, PA-C  PARoxetine (PAXIL) 40 MG tablet Take 40 mg by mouth every morning.    [provider]  spironolactone (ALDACTONE) 25 MG tablet Take 25 mg by mouth at bedtime. 10/07/21   [provider]  Syringe/Needle, Disp, (SYRINGE 3CC/21GX1") 21G X 1" 3 ML MISC Used to inject testosterone every 14 days 05/16/16   Roma Kayser, MD  Syringe/Needle, Disp, 25G X 1" 1 ML MISC Use as directed 11/28/16   Roma Kayser, MD  tamsulosin (FLOMAX) 0.4 MG CAPS capsule Take 0.4 mg by mouth at bedtime. 10/07/21   [provider]  testosterone cypionate (DEPOTESTOSTERONE CYPIONATE) 200 MG/ML injection Inject 0.5 mLs (100 mg total) into the muscle every 14 (fourteen) days. 09/17/16   Roma Kayser, MD  verapamil (CALAN) 120 MG tablet Take 120 mg by mouth in the morning.    [provider]                                                                                                                                    Past Surgical History Past Surgical History:  Procedure Laterality Date   carpal tunel Left    HARDWARE REMOVAL Left  07/26/2022   Procedure: HARDWARE REMOVAL ELBOW;  Surgeon:  Haddix, Gillie Manners, MD;  Location: [facility] OR;  Service: Orthopedics;  Laterality: Left;   ORIF HUMERUS FRACTURE Left 01/16/2022   Procedure: OPEN REDUCTION INTERNAL FIXATION (ORIF) DISTAL HUMERUS FRACTURE;  Surgeon: Roby Lofts, MD;  Location: [facility] OR;  Service: Orthopedics;  Laterality: Left;   OTHER SURGICAL HISTORY     R & L shoulder   OTHER SURGICAL HISTORY Right    Knee surgery x several   TOTAL HIP ARTHROPLASTY Left 12/26/2017   Procedure: LEFT TOTAL HIP ARTHROPLASTY ANTERIOR APPROACH;  Surgeon: Kathryne Hitch, MD;  Location: [facility];  Service: Orthopedics;  Laterality: Left;   TOTAL KNEE ARTHROPLASTY Left 11/23/2021   Procedure: LEFT TOTAL KNEE ARTHROPLASTY;  Surgeon: Kathryne Hitch, MD;  Location: [facility];  Service: Orthopedics;  Laterality: Left;   Family History Family History  Problem Relation Age of Onset   Diabetes Mother    Hypertension Mother    Heart attack Mother    Osteoporosis Mother    Diabetes Father    Hypertension Father    Heart attack Father    Diabetes Sister    Hypertension Sister    Diabetes Brother    Hypertension Brother     Social History Social History   Tobacco Use   Smoking status: Every Day    Current packs/day: 0.50    Average packs/day: 0.5 packs/day for 25.0 years (12.5 ttl pk-yrs)    Types: Cigarettes   Smokeless tobacco: Never  Vaping Use   Vaping status: Never Used  Substance Use Topics   Alcohol use: No   Drug use: Yes    Types: Marijuana    Comment: Last use was 01/14/22.   Allergies Morphine and codeine, Shellfish allergy, Betadine [povidone iodine], Chlorhexidine, Influenza vaccines, and Metoprolol  Review of Systems Review of Systems  Constitutional:  Negative for chills and fever.  Respiratory:  Negative for shortness of breath.   Cardiovascular:  Negative for chest pain.  Gastrointestinal:  Negative for abdominal pain.  Genitourinary:  Negative for  dysuria.  Musculoskeletal:  Positive for arthralgias. Negative for back pain.  All other systems reviewed and are negative.   Physical Exam Vital Signs  I have reviewed the triage vital signs BP (!) 166/127   Pulse 82   Temp 98.1 F (36.7 C) (Oral)   Resp 16   Ht 6' (1.829 m)   Wt 115.7 kg   SpO2 97%   BMI 34.58 kg/m  Physical Exam Vitals and nursing note reviewed.  Constitutional:      General: He is not in acute distress.    Appearance: Normal appearance. He is well-developed. He is not ill-appearing.  HENT:     Head: Normocephalic and atraumatic.     Right Ear: External ear normal.     Left Ear: External ear normal.     Nose: Nose normal.     Mouth/Throat:     Mouth: Mucous membranes are moist.  Eyes:     General: No scleral icterus.       Right eye: No discharge.        Left eye: No discharge.  Cardiovascular:     Rate and Rhythm: Normal rate.  Pulmonary:     Effort: Pulmonary effort is normal. No respiratory distress.     Breath sounds: No stridor.  Abdominal:     General: Abdomen is flat. There is no distension.     Tenderness: There is no guarding.  Musculoskeletal:        General:  No deformity.       Arms:     Cervical back: No rigidity.     Comments: Bilateral upper extremities are NVI   Skin:    General: Skin is warm and dry.     Coloration: Skin is not cyanotic, jaundiced or pale.  Neurological:     Mental Status: He is alert and oriented to person, place, and time.     GCS: GCS eye subscore is 4. GCS verbal subscore is 5. GCS motor subscore is 6.  Psychiatric:        Speech: Speech normal.        Behavior: Behavior normal. Behavior is cooperative.     ED Results and Treatments Labs (all labs ordered are listed, but only abnormal results are displayed) Labs Reviewed - No data to display                                                                                                                        Radiology No results  found.  Pertinent labs & imaging results that were available during my care of the patient were reviewed by me and considered in my medical decision making (see MDM for details).  Medications Ordered in ED Medications - No data to display                                                                                                                                   Procedures Procedures  (including critical care time)  Medical Decision Making / ED Course    Medical Decision Making:    Willoughby Cordy. is a 57 y.o. male with past medical history as below, significant for tobacco use, DM2, HLD, HTN, obesity, sleep apnea on CPAP, prior left TKA, left total hip who presents to the ED with complaint of arm pain. . The complaint involves an extensive differential diagnosis and also carries with it a high risk of complications and morbidity.  Serious etiology was considered. Ddx includes but is not limited to: Fracture, dislocation, sprain, strain, etc.  Complete initial physical exam performed, notably the patient  was acute distress, left upper extremity NVI, obvious deformity to left distal humerus Reviewed and confirmed nursing documentation for past medical history, family history, social history.  Vital signs reviewed.        Wet read of humerus x-ray shows distal fracture.  He is NVI.  Will  discuss with orthopedics on-call                Additional history obtained: -Additional history obtained from spouse -External records from outside source obtained and reviewed including: Chart review including previous notes, labs, imaging, consultation notes including  Prior imaging Home meds Prior ortho notes   Lab Tests: na  EKG   EKG Interpretation Date/Time:    Ventricular Rate:    PR Interval:    QRS Duration:    QT Interval:    QTC Calculation:   R Axis:      Text Interpretation:           Imaging Studies ordered: I ordered imaging studies  including humerus xr I independently visualized the following imaging with scope of interpretation limited to determining acute life threatening conditions related to emergency care; findings noted above I independently visualized and interpreted imaging. I agree with the radiologist interpretation   Medicines ordered and prescription drug management: No orders of the defined types were placed in this encounter.   -I have reviewed the patients home medicines and have made adjustments as needed   Consultations Obtained: I requested consultation with the ***,  and discussed lab and imaging findings as well as pertinent plan - they recommend: ***   Cardiac Monitoring: The patient was maintained on a cardiac monitor.  I personally viewed and interpreted the cardiac monitored which showed an underlying rhythm of: *** Continuous pulse oximetry interpreted by myself, ***% on ***.    Social Determinants of Health:  Diagnosis or treatment significantly limited by social determinants of health: {wssoc:28071}   Reevaluation: After the interventions noted above, I reevaluated the patient and found that they have {resolved/improved/worsened:23923::"improved"}  Co morbidities that complicate the patient evaluation  Past Medical History:  Diagnosis Date   Anxiety    Arthritis    Asthma    Bipolar disorder (HCC)    Current every day smoker    Depression    Diabetes mellitus, type II (HCC)    Dyspnea    Hyperlipidemia    Hypertension    Morbid obesity (HCC)    Sedentary lifestyle    Seizures (HCC)    last seizure 8-9 yrs ago, no current problem   Sleep apnea    uses CPAP nightly      Dispostion: Disposition decision including need for hospitalization was considered, and patient {wsdispo:28070::"discharged from emergency department."}    Final Clinical Impression(s) / ED Diagnoses Final diagnoses:  None

## 2023-01-25 MED ORDER — NAPROXEN 375 MG PO TABS: 375.0000 mg | ORAL_TABLET | Freq: Two times a day (BID) | ORAL | 0 refills | Status: AC

## 2023-01-28 ENCOUNTER — Other Ambulatory Visit: Payer: Self-pay

## 2023-01-28 ENCOUNTER — Encounter (HOSPITAL_COMMUNITY): Payer: Self-pay | Admitting: Student

## 2023-01-28 ENCOUNTER — Ambulatory Visit (HOSPITAL_COMMUNITY): Payer: Self-pay | Admitting: Student

## 2023-01-28 NOTE — H&P (Signed)
[facility] H&P  Patient ID: [name]. MRN: [id number] DOB/AGE: 1965-04-15 57 y.o.  Reason for surgery: Left distal humerus fracture  HPI: Dashan Kinlock. is an 57 y.o. male with PMH significant for anxiety, tobacco use disorder, T2DM, HTN, HLD presenting for surgery on the left upper extremity. Patient sustained a left distal humerus fracture following a fall in his bathtub on 01/09/22.  He underwent ORIF of left distal humerus by Dr. Jena Gauss on 01/16/2022.  Due to hardware failure, patient underwent hardware removal on 07/26/2022.  On 01/24/2023, patient noted he was picking up a 10 to 15 pound box when he suddenly felt a stabbing sensation in his left arm.  He had immediate swelling discomfort noted just above the elbow.  He noted difficulty moving the arm.  No significant injury noted.  He was seen at [facility] emergency department and unfortunately found to have a fracture of his distal humerus just above the nonunion site where hardware had been removed in May.  Patient was placed in a long-arm splint and instructed follow-up with orthopedics outpatient.  Patient seen in OTS clinic on 01/28/2023.  Pain has been fairly well-controlled.  Is tolerating the long-arm splint.  No numbness or tingling denies any numbness or tingling in the left upper extremity.  Presents now for surgical fixation of his left distal humerus fracture.  Past Medical History:  Diagnosis Date   Anxiety    Arthritis    Asthma    Bipolar disorder (HCC)    Current every day smoker    Depression    Diabetes mellitus, type II (HCC)    Dyspnea    History of kidney stones    History of pneumonia    Hyperlipidemia    Hypertension    Morbid obesity (HCC)    Sedentary lifestyle    Seizures (HCC)    last seizure 12 yrs ago, no current problem   Sleep apnea    uses CPAP nightly    Past Surgical History:  Procedure Laterality Date   CARDIAC CATHETERIZATION  2020   carpal tunel Left     COLONOSCOPY     HARDWARE REMOVAL Left 07/26/2022   Procedure: HARDWARE REMOVAL ELBOW;  Surgeon: Roby Lofts, MD;  Location: [facility] OR;  Service: Orthopedics;  Laterality: Left;   ORIF HUMERUS FRACTURE Left 01/16/2022   Procedure: OPEN REDUCTION INTERNAL FIXATION (ORIF) DISTAL HUMERUS FRACTURE;  Surgeon: Roby Lofts, MD;  Location: [facility] OR;  Service: Orthopedics;  Laterality: Left;   OTHER SURGICAL HISTORY     R & L shoulder   OTHER SURGICAL HISTORY Right    Knee surgery x several   TOTAL HIP ARTHROPLASTY Left 12/26/2017   Procedure: LEFT TOTAL HIP ARTHROPLASTY ANTERIOR APPROACH;  Surgeon: Kathryne Hitch, MD;  Location: [facility];  Service: Orthopedics;  Laterality: Left;   TOTAL KNEE ARTHROPLASTY Left 11/23/2021   Procedure: LEFT TOTAL KNEE ARTHROPLASTY;  Surgeon: Kathryne Hitch, MD;  Location: [facility];  Service: Orthopedics;  Laterality: Left;    Family History  Problem Relation Age of Onset   Diabetes Mother    Hypertension Mother    Heart attack Mother    Osteoporosis Mother    Diabetes Father    Hypertension Father    Heart attack Father    Diabetes Sister    Hypertension Sister    Diabetes Brother    Hypertension Brother     Social History:  reports that he has been smoking  cigarettes. He has a 12.5 pack-year smoking history. He has never used smokeless tobacco. He reports current drug use. Drug: Marijuana. He reports that he does not drink alcohol.  Allergies:  Allergies  Allergen Reactions   Morphine And Codeine Anaphylaxis   Shellfish Allergy Anaphylaxis   Betadine [Povidone Iodine] Itching   Chlorhexidine Hives   Influenza Vaccines Hives   Metoprolol Rash    Medications: I have reviewed the patient's current medications. Prior to Admission:  No medications prior to admission.    ROS: Constitutional: No fever or chills Vision: No changes in vision ENT: No difficulty swallowing CV: No chest pain Pulm: No SOB or wheezing GI: No nausea or  vomiting GU: No urgency or inability to hold urine Skin: No poor wound healing Neurologic: No numbness or tingling Psychiatric: No depression or anxiety Heme: No bruising Allergic: No reaction to medications or food   Exam: There were no vitals taken for this visit. General: No acute distress Orientation: Alert and oriented x 4 Mood and Affect: Mood affect appropriate.  Pleasant and cooperative Gait: Within normal limits Coordination and balance: Within normal limits  Left upper extremity: Sling and long-arm splint in place.  Nontender above splint.  Able to wiggle fingers.  Endorses sensation to light touch over all aspects of the hand.  Motor function intact through the median, ulnar, radial nerve distribution of the hand.  Not easily able to assess motor or sensory function at the level of the wrist or the elbow due to splint being in place.  Fingers warm and well-perfused.  Brisk cap refill.  Compartments soft and compressible   Right upper extremity: Skin without lesions. No tenderness to palpation. Full painless ROM, full strength in each muscle group without evidence of instability.  Motor and sensory function at baseline.  Neurovascularly intact   Medical Decision Making: Data: Imaging: AP and lateral views of the left humerus show angulated, displaced fracture of the distal humerus just above the previous nonunion site.  Labs: No results found for this or any previous visit (from the past 168 hour(s)).  Assessment/Plan: 57 year old male with left distal humerus fracture.   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  This will require surgical intervention.  Recommend proceeding with open duction internal fixation of the left distal humerus.  Risks and benefits of procedure been discussed with the patient. Risks discussed included  bleeding, infection, malunion, nonunion, damage to surrounding nerves and blood vessels, pain, hardware prominence or irritation, hardware failure, stiffness, post-traumatic arthritis of the elbow, DVT/PE, compartment syndrome, and even anesthesia complication.  Patient states understanding of these risks and agrees to procedure surgery.  Consent will be obtained.  I will plan to admit the patient overnight for pain control and therapies.  Plan to discharge home on postoperative day #1.  Thompson Caul PA-C [facility] [phone] (office) [email or web address]

## 2023-01-28 NOTE — Progress Notes (Signed)
Anesthesia Chart Review:  Case: [id number] Date/Time: 01/29/23 0815   Procedure: OPEN REDUCTION INTERNAL FIXATION (ORIF) DISTAL HUMERUS FRACTURE (Left)   Anesthesia type: General   Pre-op diagnosis: Left humerus fracture   [facility]: [facility] ROOM 03 / [facility]   Surgeons: Roby Lofts, MD       DISCUSSION: Patient is a 57 year old male scheduled for the above procedure.Prior left humerus fracture ORIF 01/16/22 with removal of hardware on 07/26/22. He was picking up a 10-15 lg box and felt a stabbing sensation to his left arm with immediate pain and swelling proximal to his left elbow. Left humerus xray showed: 1. Angulated and mildly displaced distal humeral fracture. 2. Remote distal humerus fracture with nonunion. Previous surgical hardware has been removed. 3. Diffusely decreased bone mineral density.   Long-arm splint applied with outpatient follow-up with Dr. Jena Gauss for further management recommended. Now scheduled for ORIF.   Other history includes smoking, HTN, HLD, DM2, asthma, dyspnea, anxiety, OSA, bipolar disorder, seizures, osteoarthritis (left THA 12/26/17; left TKA 11/23/21), left humerus fracture (s/p ORIF 01/16/22, removal of hardware 07/26/22).  He had cardiology evaluation in 11/2018 for chest pain.  He underwent LHC 12/17/18: EF 55% CAD non obstructing 25% LAD, 25% mid Ramus, LVEDP 30. Echo showed EF: 55%, normal RV, G1DD, trileaflet aortic valve. He was treated with Lasix for elevated LVEDP and recommended better control of HTN. Volume status good, HTN controlled and compliant with CPAP at his last visit with [facility] Cardiologist Dr. Sharrell Ku on 08/02/19.   He is a same-day workup, so anesthesia team to evaluate on the day of surgery.  Updated labs and EKG on arrival as indicated.   VS:  Wt Readings from Last 3 Encounters:  01/24/23 115.7 kg  07/26/22 120.2 kg  01/16/22 127.9 kg   BP Readings from Last 3 Encounters:  01/24/23 (!) 183/98  07/26/22 (!) 128/95  01/16/22 (!) 141/88    Pulse Readings from Last 3 Encounters:  01/24/23 83  07/26/22 74  01/16/22 76     PROVIDERS: Arlina Robes, MD is PCP  - He had cardiology evaluation in 11/2018 for chest pain.  He underwent LHC 12/17/18: EF 55% CAD non obstructing 25% LAD, 25% mid Ramus, LVEDP 30. Echo showed EF: 55%, normal RV, G1DD, trileaflet aortic valve. He was treated with Lasix for elevated LVEDP and recommended better control of HTN (SBP up to 200's on admission). Last visit 08/02/19 with Assar, Rayetta Pigg, DO with [facility] for HFpEF follow-up.  He was euvolemic without recent need for as needed Lasix.  Hypertension and hyperlipidemia controlled at that time and managed by primary care.  Was also noncompliant with OSA.  No changes made with 6 months follow-up recommended, but appears he has opted for follow-up through primary care.     LABS: For day of surgery as indicated.  Last results in [facility] include: Lab Results  Component Value Date   WBC 6.4 07/26/2022   HGB 11.1 (L) 07/26/2022   HCT 35.1 (L) 07/26/2022   PLT 279 07/26/2022   GLUCOSE 107 (H) 07/26/2022   NA 135 07/26/2022   K 3.5 07/26/2022   CL 101 07/26/2022   CREATININE 1.11 07/26/2022   BUN 13 07/26/2022   CO2 25 07/26/2022   HGBA1C 6.5 (H) 11/12/2021      EKG: EKG 11/12/21: NSR   CV: Cardiac cath 12/22/18 [facility]): Findings:  1. Non-flow limiting coronary artery disease (as detailed below) including  a 25% mid LAD stenosis and 25%  mid ramus stenosis.  2. Elevated left ventricular filling pressures (LVEDP = 30mm Hg).  3. Normal left ventricular systolic function (estimated LVEF = 55%).   Recommendations:  1. Aggressive secondary prevention.  2. Antiplatelet therapy with aspirin 81mg  daily indefinitely.  3. Consider initiation of SGLT2i.  4. Further management per inpatient cardiology team.    Echo 12/17/18 [facility]): Summary   1. Normal left ventricular size and systolic function, ejection fraction > 55%.   2.  Diastolic dysfunction - grade I (normal filling pressures).    3. Mitral annular calcification.    4. Aortic sclerosis.    5. Normal right ventricular size and systolic function.    Past Medical History:  Diagnosis Date   Anxiety    Arthritis    Asthma    Bipolar disorder (HCC)    Current every day smoker    Depression    Diabetes mellitus, type II (HCC)    Dyspnea    Hyperlipidemia    Hypertension    Morbid obesity (HCC)    Sedentary lifestyle    Seizures (HCC)    last seizure 8-9 yrs ago, no current problem   Sleep apnea    uses CPAP nightly    Past Surgical History:  Procedure Laterality Date   carpal tunel Left    HARDWARE REMOVAL Left 07/26/2022   Procedure: HARDWARE REMOVAL ELBOW;  Surgeon: Roby Lofts, MD;  Location: [facility] OR;  Service: Orthopedics;  Laterality: Left;   ORIF HUMERUS FRACTURE Left 01/16/2022   Procedure: OPEN REDUCTION INTERNAL FIXATION (ORIF) DISTAL HUMERUS FRACTURE;  Surgeon: Roby Lofts, MD;  Location: [facility] OR;  Service: Orthopedics;  Laterality: Left;   OTHER SURGICAL HISTORY     R & L shoulder   OTHER SURGICAL HISTORY Right    Knee surgery x several   TOTAL HIP ARTHROPLASTY Left 12/26/2017   Procedure: LEFT TOTAL HIP ARTHROPLASTY ANTERIOR APPROACH;  Surgeon: Kathryne Hitch, MD;  Location: [facility] ORS;  Service: Orthopedics;  Laterality: Left;   TOTAL KNEE ARTHROPLASTY Left 11/23/2021   Procedure: LEFT TOTAL KNEE ARTHROPLASTY;  Surgeon: Kathryne Hitch, MD;  Location: [facility] ORS;  Service: Orthopedics;  Laterality: Left;    MEDICATIONS: No current facility-administered medications for this encounter.    acetaminophen (TYLENOL) 325 MG tablet   albuterol (PROVENTIL HFA;VENTOLIN HFA) 108 (90 Base) MCG/ACT inhaler   albuterol (PROVENTIL) (2.5 MG/3ML) 0.083% nebulizer solution   amLODipine (NORVASC) 10 MG tablet   aspirin EC 81 MG tablet   atorvastatin (LIPITOR) 40 MG tablet   busPIRone (BUSPAR) 15 MG tablet   celecoxib (CELEBREX)  200 MG capsule   cyclobenzaprine (FLEXERIL) 5 MG tablet   divalproex (DEPAKOTE) 500 MG DR tablet   EPINEPHrine 0.3 mg/0.3 mL IJ SOAJ injection   esomeprazole (NEXIUM) 40 MG capsule   Fluticasone-Salmeterol (ADVAIR) 500-50 MCG/DOSE AEPB   furosemide (LASIX) 40 MG tablet   gabapentin (NEURONTIN) 300 MG capsule   hydrALAZINE (APRESOLINE) 10 MG tablet   hydrOXYzine (ATARAX) 25 MG tablet   lisinopril (ZESTRIL) 20 MG tablet   metFORMIN (GLUCOPHAGE) 1000 MG tablet   montelukast (SINGULAIR) 10 MG tablet   naproxen (NAPROSYN) 375 MG tablet   OVER THE COUNTER MEDICATION   oxyCODONE (ROXICODONE) 5 MG immediate release tablet   PARoxetine (PAXIL) 40 MG tablet   spironolactone (ALDACTONE) 25 MG tablet   Syringe/Needle, Disp, (SYRINGE 3CC/21GX1") 21G X 1" 3 ML MISC   Syringe/Needle, Disp, 25G X 1" 1 ML MISC   tamsulosin (FLOMAX) 0.4 MG  CAPS capsule   testosterone cypionate (DEPOTESTOSTERONE CYPIONATE) 200 MG/ML injection   verapamil (CALAN) 120 MG tablet    Shonna Chock, PA-C Surgical Short Stay/Anesthesiology [facility] Phone [phone] [facility] Phone [phone] 01/28/2023 4:04 PM

## 2023-01-28 NOTE — Progress Notes (Signed)
SDW CALL  Patient was given pre-op instructions over the phone. The opportunity was given for the patient to ask questions. No further questions asked. Patient verbalized understanding of instructions given.   PCP - Dr. Ardean Larsen Cardiologist - denies  PPM/ICD - denies Device Orders - n/a Rep Notified - n/a  Chest x-ray - denies EKG - DOS Stress Test denies-  ECHO - 11/2018 Cardiac Cath - 12/2018  Sleep Study - OSA+ CPAP - does not know settings  Fasting Blood Sugar - 128- 138 Checks Blood Sugar ___2__ times a day  Last dose of GLP1 agonist-  n/a GLP1 instructions: n/a  Blood Thinner Instructions: n/a Aspirin Instructions: last dose of Aspirin 11/11  ERAS Protcol - NPO at midnight  COVID TEST- n/a   Anesthesia review: yes  Patient denies shortness of breath, fever, cough and chest pain over the phone call   All instructions explained to the patient, with a verbal understanding of the material. Patient agrees to go over the instructions while at home for a better understanding.    Patient states that his blood pressure has been higher since he has been in pain - approximately 162/109.

## 2023-01-28 NOTE — Anesthesia Preprocedure Evaluation (Addendum)
Anesthesia Evaluation  Patient identified by MRN, date of birth, ID band Patient awake    Reviewed: Allergy & Precautions, NPO status , Patient's Chart, lab work & pertinent test results  Airway Mallampati: II  TM Distance: >3 FB Neck ROM: Full    Dental no notable dental hx. (+) Missing, Dental Advisory Given,    Pulmonary shortness of breath, sleep apnea and Continuous Positive Airway Pressure Ventilation , Current Smoker and Patient abstained from smoking.   Pulmonary exam normal breath sounds clear to auscultation       Cardiovascular hypertension, Pt. on medications Normal cardiovascular exam Rhythm:Regular Rate:Normal     Neuro/Psych Seizures -, Well Controlled,  PSYCHIATRIC DISORDERS   Bipolar Disorder      GI/Hepatic   Endo/Other  diabetes, Type 2, Oral Hypoglycemic Agents    Renal/GU      Musculoskeletal  (+) Arthritis , Osteoarthritis,    Abdominal  (+) + obese (BMI 34.6)  Peds  Hematology   Anesthesia Other Findings All: see list  Reproductive/Obstetrics                              Anesthesia Physical Anesthesia Plan  ASA: 3  Anesthesia Plan: General   Post-op Pain Management: Ketamine IV*   Induction: Intravenous  PONV Risk Score and Plan: 2 and Treatment may vary due to age or medical condition, Midazolam and Ondansetron  Airway Management Planned: Oral ETT  Additional Equipment: None  Intra-op Plan:   Post-operative Plan: Extubation in OR  Informed Consent: I have reviewed the patients History and Physical, chart, labs and discussed the procedure including the risks, benefits and alternatives for the proposed anesthesia with the patient or authorized representative who has indicated his/her understanding and acceptance.     Dental advisory given  Plan Discussed with: CRNA and Anesthesiologist  Anesthesia Plan Comments: (PAT note written 01/28/2023 by  Shonna Chock, PA-C.  [place]  )        Anesthesia Quick Evaluation

## 2023-01-29 ENCOUNTER — Observation Stay (HOSPITAL_COMMUNITY): Payer: Medicare PPO

## 2023-01-29 ENCOUNTER — Ambulatory Visit (HOSPITAL_COMMUNITY): Payer: Self-pay | Admitting: Vascular Surgery

## 2023-01-29 ENCOUNTER — Ambulatory Visit (HOSPITAL_COMMUNITY): Payer: Medicare PPO

## 2023-01-29 ENCOUNTER — Other Ambulatory Visit: Payer: Self-pay

## 2023-01-29 ENCOUNTER — Encounter (HOSPITAL_COMMUNITY)
Admission: RE | Disposition: A | Discharge status: Home / Self Care | Payer: Self-pay | Source: Home / Self Care | Attending: Student

## 2023-01-29 ENCOUNTER — Observation Stay (HOSPITAL_COMMUNITY)
Admission: RE | Admit: 2023-01-29 | Discharge: 2023-01-30 | Disposition: A | Discharge status: Home / Self Care | Payer: Medicare PPO | Attending: Student | Admitting: Student

## 2023-01-29 ENCOUNTER — Ambulatory Visit (HOSPITAL_BASED_OUTPATIENT_CLINIC_OR_DEPARTMENT_OTHER): Payer: Medicare PPO | Admitting: Vascular Surgery

## 2023-01-29 DIAGNOSIS — Z96642 Presence of left artificial hip joint: Secondary | ICD-10-CM | POA: Insufficient documentation

## 2023-01-29 DIAGNOSIS — X58XXXA Exposure to other specified factors, initial encounter: Secondary | ICD-10-CM | POA: Insufficient documentation

## 2023-01-29 DIAGNOSIS — Z833 Family history of diabetes mellitus: Secondary | ICD-10-CM | POA: Diagnosis not present

## 2023-01-29 DIAGNOSIS — Z8249 Family history of ischemic heart disease and other diseases of the circulatory system: Secondary | ICD-10-CM | POA: Diagnosis not present

## 2023-01-29 DIAGNOSIS — I10 Essential (primary) hypertension: Secondary | ICD-10-CM | POA: Diagnosis not present

## 2023-01-29 DIAGNOSIS — F17210 Nicotine dependence, cigarettes, uncomplicated: Secondary | ICD-10-CM | POA: Diagnosis not present

## 2023-01-29 DIAGNOSIS — S42415K Nondisplaced simple supracondylar fracture without intercondylar fracture of left humerus, subsequent encounter for fracture with nonunion: Secondary | ICD-10-CM | POA: Diagnosis not present

## 2023-01-29 DIAGNOSIS — Z96652 Presence of left artificial knee joint: Secondary | ICD-10-CM | POA: Diagnosis not present

## 2023-01-29 DIAGNOSIS — S42402A Unspecified fracture of lower end of left humerus, initial encounter for closed fracture: Principal | ICD-10-CM | POA: Diagnosis present

## 2023-01-29 DIAGNOSIS — S42302A Unspecified fracture of shaft of humerus, left arm, initial encounter for closed fracture: Secondary | ICD-10-CM | POA: Diagnosis present

## 2023-01-29 DIAGNOSIS — J45909 Unspecified asthma, uncomplicated: Secondary | ICD-10-CM | POA: Diagnosis not present

## 2023-01-29 DIAGNOSIS — E119 Type 2 diabetes mellitus without complications: Secondary | ICD-10-CM | POA: Insufficient documentation

## 2023-01-29 HISTORY — DX: Personal history of pneumonia (recurrent): Z87.01

## 2023-01-29 HISTORY — PX: ORIF HUMERUS FRACTURE: SHX2126

## 2023-01-29 HISTORY — DX: Personal history of urinary calculi: Z87.442

## 2023-01-29 LAB — CBC
HCT: 31.9 % — ABNORMAL LOW (ref 39.0–52.0)
HCT: 29.2 % — ABNORMAL LOW (ref 39.0–52.0)
Hemoglobin: 9.7 g/dL — ABNORMAL LOW (ref 13.0–17.0)
Hemoglobin: 8.8 g/dL — ABNORMAL LOW (ref 13.0–17.0)
MCH: 25.3 pg — ABNORMAL LOW (ref 26.0–34.0)
MCH: 25.6 pg — ABNORMAL LOW (ref 26.0–34.0)
MCHC: 30.4 g/dL (ref 30.0–36.0)
MCHC: 30.1 g/dL (ref 30.0–36.0)
MCV: 83.1 fL (ref 80.0–100.0)
MCV: 84.9 fL (ref 80.0–100.0)
Platelets: 299 10*3/uL (ref 150–400)
Platelets: 316 10*3/uL (ref 150–400)
RBC: 3.84 MIL/uL — ABNORMAL LOW (ref 4.22–5.81)
RBC: 3.44 MIL/uL — ABNORMAL LOW (ref 4.22–5.81)
RDW: 17.6 % — ABNORMAL HIGH (ref 11.5–15.5)
RDW: 17.7 % — ABNORMAL HIGH (ref 11.5–15.5)
WBC: 6.3 10*3/uL (ref 4.0–10.5)
WBC: 6.9 10*3/uL (ref 4.0–10.5)
nRBC: 0 % (ref 0.0–0.2)
nRBC: 0 % (ref 0.0–0.2)

## 2023-01-29 LAB — TYPE AND SCREEN
ABO/RH(D): A POS
Antibody Screen: NEGATIVE

## 2023-01-29 LAB — SEDIMENTATION RATE: Sed Rate: 18 mm/h — ABNORMAL HIGH (ref 0–16)

## 2023-01-29 LAB — COMPREHENSIVE METABOLIC PANEL
ALT: 9 U/L (ref 0–44)
AST: 10 U/L — ABNORMAL LOW (ref 15–41)
Albumin: 2.5 g/dL — ABNORMAL LOW (ref 3.5–5.0)
Alkaline Phosphatase: 34 U/L — ABNORMAL LOW (ref 38–126)
Anion gap: 6 (ref 5–15)
BUN: 13 mg/dL (ref 6–20)
CO2: 25 mmol/L (ref 22–32)
Calcium: 8.3 mg/dL — ABNORMAL LOW (ref 8.9–10.3)
Chloride: 104 mmol/L (ref 98–111)
Creatinine, Ser: 1.19 mg/dL (ref 0.61–1.24)
GFR, Estimated: >60 mL/min (ref >60)
Glucose, Bld: 163 mg/dL — ABNORMAL HIGH (ref 70–99)
Potassium: 4.1 mmol/L (ref 3.5–5.1)
Sodium: 135 mmol/L (ref 135–145)
Total Bilirubin: 0.6 mg/dL (ref <1.2)
Total Protein: 7.3 g/dL (ref 6.5–8.1)

## 2023-01-29 LAB — GLUCOSE, CAPILLARY
Glucose-Capillary: 113 mg/dL — ABNORMAL HIGH (ref 70–99)
Glucose-Capillary: 181 mg/dL — ABNORMAL HIGH (ref 70–99)
Glucose-Capillary: 145 mg/dL — ABNORMAL HIGH (ref 70–99)
Glucose-Capillary: 137 mg/dL — ABNORMAL HIGH (ref 70–99)

## 2023-01-29 LAB — BASIC METABOLIC PANEL
Anion gap: 6 (ref 5–15)
BUN: 12 mg/dL (ref 6–20)
CO2: 25 mmol/L (ref 22–32)
Calcium: 8.4 mg/dL — ABNORMAL LOW (ref 8.9–10.3)
Chloride: 104 mmol/L (ref 98–111)
Creatinine, Ser: 1.38 mg/dL — ABNORMAL HIGH (ref 0.61–1.24)
GFR, Estimated: 59 mL/min — ABNORMAL LOW (ref >60)
Glucose, Bld: 115 mg/dL — ABNORMAL HIGH (ref 70–99)
Potassium: 4.1 mmol/L (ref 3.5–5.1)
Sodium: 135 mmol/L (ref 135–145)

## 2023-01-29 LAB — HEMOGLOBIN A1C
Hgb A1c MFr Bld: 6.7 % — ABNORMAL HIGH (ref 4.8–5.6)
Mean Plasma Glucose: 145.59 mg/dL

## 2023-01-29 LAB — C-REACTIVE PROTEIN: CRP: <0.5 mg/dL (ref <1.0)

## 2023-01-29 LAB — VITAMIN D 25 HYDROXY (VIT D DEFICIENCY, FRACTURES): Vit D, 25-Hydroxy: 17.54 ng/mL — ABNORMAL LOW (ref 30–100)

## 2023-01-29 SURGERY — OPEN REDUCTION INTERNAL FIXATION (ORIF) DISTAL HUMERUS FRACTURE
Anesthesia: General | Laterality: Left

## 2023-01-29 MED ORDER — HYDROMORPHONE HCL 1 MG/ML IJ SOLN
0.2500 mg | INTRAMUSCULAR | Status: DC | PRN
  Administered 2023-01-29: 0.5 mg via INTRAVENOUS

## 2023-01-29 MED ORDER — SODIUM CHLORIDE 0.9% FLUSH
10.0000 mL | Freq: Two times a day (BID) | INTRAVENOUS | Status: DC
  Administered 2023-01-29 – 2023-01-30 (×3): 10 mL via INTRAVENOUS

## 2023-01-29 MED ORDER — HYDROXYZINE HCL 25 MG PO TABS
25.0000 mg | ORAL_TABLET | Freq: Every day | ORAL | Status: DC
  Administered 2023-01-29: 25 mg via ORAL
  Filled 2023-01-29: qty 1

## 2023-01-29 MED ORDER — ACETAMINOPHEN 10 MG/ML IV SOLN
1000.0000 mg | Freq: Once | INTRAVENOUS | Status: DC | PRN
Start: 2023-01-29 — End: 2023-01-29

## 2023-01-29 MED ORDER — PROPOFOL 10 MG/ML IV BOLUS
INTRAVENOUS | Status: AC
  Filled 2023-01-29: qty 20

## 2023-01-29 MED ORDER — PHENYLEPHRINE HCL-NACL 20-0.9 MG/250ML-% IV SOLN
INTRAVENOUS | Status: DC | PRN
  Administered 2023-01-29: 10 ug/min via INTRAVENOUS

## 2023-01-29 MED ORDER — HYDROMORPHONE HCL 1 MG/ML IJ SOLN: 0.5000 mg | INTRAMUSCULAR | Status: DC | PRN

## 2023-01-29 MED ORDER — DEXAMETHASONE SODIUM PHOSPHATE 10 MG/ML IJ SOLN
INTRAMUSCULAR | Status: DC | PRN
  Administered 2023-01-29: 5 mg via INTRAVENOUS

## 2023-01-29 MED ORDER — LIDOCAINE 2% (20 MG/ML) 5 ML SYRINGE
INTRAMUSCULAR | Status: DC | PRN
  Administered 2023-01-29: 100 mg via INTRAVENOUS

## 2023-01-29 MED ORDER — CHLORHEXIDINE GLUCONATE 0.12 % MT SOLN: 15.0000 mL | Freq: Once | OROMUCOSAL | Status: DC

## 2023-01-29 MED ORDER — VANCOMYCIN HCL 1000 MG IV SOLR
INTRAVENOUS | Status: AC
  Filled 2023-01-29: qty 20

## 2023-01-29 MED ORDER — METOCLOPRAMIDE HCL 5 MG PO TABS: 5.0000 mg | ORAL_TABLET | Freq: Three times a day (TID) | ORAL | Status: DC | PRN

## 2023-01-29 MED ORDER — MONTELUKAST SODIUM 10 MG PO TABS
10.0000 mg | ORAL_TABLET | Freq: Every day | ORAL | Status: DC
  Administered 2023-01-29: 10 mg via ORAL
  Filled 2023-01-29: qty 1

## 2023-01-29 MED ORDER — KETAMINE HCL 50 MG/5ML IJ SOSY
PREFILLED_SYRINGE | INTRAMUSCULAR | Status: AC
  Filled 2023-01-29: qty 5

## 2023-01-29 MED ORDER — AMISULPRIDE (ANTIEMETIC) 5 MG/2ML IV SOLN: 10.0000 mg | Freq: Once | INTRAVENOUS | Status: DC | PRN

## 2023-01-29 MED ORDER — VANCOMYCIN HCL 1000 MG IV SOLR
INTRAVENOUS | Status: DC | PRN
  Administered 2023-01-29: 1000 mg

## 2023-01-29 MED ORDER — PAROXETINE HCL 20 MG PO TABS
40.0000 mg | ORAL_TABLET | Freq: Every day | ORAL | Status: DC
  Administered 2023-01-30: 40 mg via ORAL
  Filled 2023-01-29: qty 2

## 2023-01-29 MED ORDER — 0.9 % SODIUM CHLORIDE (POUR BTL) OPTIME
TOPICAL | Status: DC | PRN
  Administered 2023-01-29: 1000 mL

## 2023-01-29 MED ORDER — HYDRALAZINE HCL 10 MG PO TABS
10.0000 mg | ORAL_TABLET | Freq: Four times a day (QID) | ORAL | Status: DC | PRN
  Administered 2023-01-30: 10 mg via ORAL
  Filled 2023-01-29: qty 1

## 2023-01-29 MED ORDER — HYDRALAZINE HCL 20 MG/ML IJ SOLN
INTRAMUSCULAR | Status: DC | PRN
  Administered 2023-01-29: 5 mg via INTRAVENOUS

## 2023-01-29 MED ORDER — CHLORHEXIDINE GLUCONATE 0.12 % MT SOLN
OROMUCOSAL | Status: AC
  Administered 2023-01-29: 15 mL via OROMUCOSAL
  Filled 2023-01-29: qty 15

## 2023-01-29 MED ORDER — ONDANSETRON HCL 4 MG/2ML IJ SOLN
INTRAMUSCULAR | Status: DC | PRN
  Administered 2023-01-29: 4 mg via INTRAVENOUS

## 2023-01-29 MED ORDER — ALBUTEROL SULFATE (2.5 MG/3ML) 0.083% IN NEBU: 3.0000 mL | INHALATION_SOLUTION | Freq: Four times a day (QID) | RESPIRATORY_TRACT | Status: DC | PRN

## 2023-01-29 MED ORDER — VERAPAMIL HCL 120 MG PO TABS
120.0000 mg | ORAL_TABLET | Freq: Every day | ORAL | Status: DC
  Filled 2023-01-29: qty 1

## 2023-01-29 MED ORDER — GABAPENTIN 300 MG PO CAPS
600.0000 mg | ORAL_CAPSULE | Freq: Every day | ORAL | Status: DC
  Administered 2023-01-29: 600 mg via ORAL
  Filled 2023-01-29: qty 2

## 2023-01-29 MED ORDER — ONDANSETRON HCL 4 MG/2ML IJ SOLN: 4.0000 mg | Freq: Four times a day (QID) | INTRAMUSCULAR | Status: DC | PRN

## 2023-01-29 MED ORDER — SUGAMMADEX SODIUM 200 MG/2ML IV SOLN
INTRAVENOUS | Status: DC | PRN
  Administered 2023-01-29: 200 mg via INTRAVENOUS

## 2023-01-29 MED ORDER — FLUTICASONE FUROATE-VILANTEROL 200-25 MCG/ACT IN AEPB
1.0000 | INHALATION_SPRAY | Freq: Every day | RESPIRATORY_TRACT | Status: DC
  Administered 2023-01-30: 1 via RESPIRATORY_TRACT
  Filled 2023-01-29: qty 28

## 2023-01-29 MED ORDER — ORAL CARE MOUTH RINSE: 15.0000 mL | Freq: Once | OROMUCOSAL | Status: AC

## 2023-01-29 MED ORDER — SODIUM CHLORIDE 0.9 % IV SOLN
2.0000 g | INTRAVENOUS | Status: DC
  Administered 2023-01-29: 2 g via INTRAVENOUS
  Filled 2023-01-29: qty 20

## 2023-01-29 MED ORDER — LABETALOL HCL 5 MG/ML IV SOLN
INTRAVENOUS | Status: DC | PRN
  Administered 2023-01-29 (×2): 5 mg via INTRAVENOUS

## 2023-01-29 MED ORDER — DOCUSATE SODIUM 100 MG PO CAPS
100.0000 mg | ORAL_CAPSULE | Freq: Two times a day (BID) | ORAL | Status: DC
  Administered 2023-01-29 – 2023-01-30 (×2): 100 mg via ORAL
  Filled 2023-01-29 (×2): qty 1

## 2023-01-29 MED ORDER — ALBUMIN HUMAN 5 % IV SOLN: INTRAVENOUS | Status: DC | PRN

## 2023-01-29 MED ORDER — ONDANSETRON HCL 4 MG/2ML IJ SOLN: 4.0000 mg | Freq: Once | INTRAMUSCULAR | Status: DC | PRN

## 2023-01-29 MED ORDER — INSULIN ASPART 100 UNIT/ML IJ SOLN
0.0000 [IU] | INTRAMUSCULAR | Status: DC | PRN
Start: 2023-01-29 — End: 2023-01-29

## 2023-01-29 MED ORDER — LISINOPRIL 20 MG PO TABS
20.0000 mg | ORAL_TABLET | Freq: Every day | ORAL | Status: DC
  Administered 2023-01-29 – 2023-01-30 (×2): 20 mg via ORAL
  Filled 2023-01-29 (×2): qty 1

## 2023-01-29 MED ORDER — ASPIRIN 81 MG PO TBEC
81.0000 mg | DELAYED_RELEASE_TABLET | Freq: Every day | ORAL | Status: DC
  Administered 2023-01-30: 81 mg via ORAL
  Filled 2023-01-29: qty 1

## 2023-01-29 MED ORDER — CELECOXIB 200 MG PO CAPS
200.0000 mg | ORAL_CAPSULE | Freq: Two times a day (BID) | ORAL | Status: DC
  Administered 2023-01-29 – 2023-01-30 (×3): 200 mg via ORAL
  Filled 2023-01-29 (×3): qty 1

## 2023-01-29 MED ORDER — POLYETHYLENE GLYCOL 3350 17 G PO PACK: 17.0000 g | PACK | Freq: Every day | ORAL | Status: DC | PRN

## 2023-01-29 MED ORDER — DIVALPROEX SODIUM 250 MG PO DR TAB
500.0000 mg | DELAYED_RELEASE_TABLET | Freq: Two times a day (BID) | ORAL | Status: DC
  Administered 2023-01-29 – 2023-01-30 (×2): 500 mg via ORAL
  Filled 2023-01-29 (×2): qty 2

## 2023-01-29 MED ORDER — KETAMINE HCL 10 MG/ML IJ SOLN
INTRAMUSCULAR | Status: DC | PRN
  Administered 2023-01-29: 20 mg via INTRAVENOUS
  Administered 2023-01-29: 10 mg via INTRAVENOUS

## 2023-01-29 MED ORDER — ACETAMINOPHEN 500 MG PO TABS
1000.0000 mg | ORAL_TABLET | Freq: Four times a day (QID) | ORAL | Status: DC
  Administered 2023-01-29 – 2023-01-30 (×3): 1000 mg via ORAL
  Filled 2023-01-29 (×2): qty 2

## 2023-01-29 MED ORDER — INSULIN ASPART 100 UNIT/ML IJ SOLN
0.0000 [IU] | Freq: Three times a day (TID) | INTRAMUSCULAR | Status: DC
  Administered 2023-01-29 – 2023-01-30 (×2): 3 [IU] via SUBCUTANEOUS

## 2023-01-29 MED ORDER — ONDANSETRON HCL 4 MG PO TABS: 4.0000 mg | ORAL_TABLET | Freq: Four times a day (QID) | ORAL | Status: DC | PRN

## 2023-01-29 MED ORDER — PANTOPRAZOLE SODIUM 40 MG PO TBEC: 80.0000 mg | DELAYED_RELEASE_TABLET | Freq: Every day | ORAL | Status: DC

## 2023-01-29 MED ORDER — FUROSEMIDE 40 MG PO TABS: 40.0000 mg | ORAL_TABLET | Freq: Every day | ORAL | Status: DC | PRN

## 2023-01-29 MED ORDER — OXYCODONE HCL 5 MG/5ML PO SOLN: 5.0000 mg | Freq: Once | ORAL | Status: DC | PRN

## 2023-01-29 MED ORDER — AMLODIPINE BESYLATE 10 MG PO TABS
10.0000 mg | ORAL_TABLET | Freq: Every day | ORAL | Status: DC
  Administered 2023-01-29: 10 mg via ORAL
  Filled 2023-01-29: qty 1

## 2023-01-29 MED ORDER — OXYCODONE HCL 5 MG PO TABS: 5.0000 mg | ORAL_TABLET | ORAL | Status: DC | PRN

## 2023-01-29 MED ORDER — MIDAZOLAM HCL 2 MG/2ML IJ SOLN
INTRAMUSCULAR | Status: AC
  Filled 2023-01-29: qty 2

## 2023-01-29 MED ORDER — VANCOMYCIN HCL IN DEXTROSE 1-5 GM/200ML-% IV SOLN
1000.0000 mg | Freq: Two times a day (BID) | INTRAVENOUS | Status: DC
  Administered 2023-01-29 – 2023-01-30 (×2): 1000 mg via INTRAVENOUS
  Filled 2023-01-29 (×2): qty 200

## 2023-01-29 MED ORDER — TAMSULOSIN HCL 0.4 MG PO CAPS
0.4000 mg | ORAL_CAPSULE | Freq: Every day | ORAL | Status: DC
  Administered 2023-01-29: 0.4 mg via ORAL
  Filled 2023-01-29: qty 1

## 2023-01-29 MED ORDER — METFORMIN HCL 500 MG PO TABS
1000.0000 mg | ORAL_TABLET | Freq: Two times a day (BID) | ORAL | Status: DC
  Administered 2023-01-29 – 2023-01-30 (×2): 1000 mg via ORAL
  Filled 2023-01-29 (×2): qty 2

## 2023-01-29 MED ORDER — PROPOFOL 10 MG/ML IV BOLUS
INTRAVENOUS | Status: DC | PRN
  Administered 2023-01-29: 150 mg via INTRAVENOUS

## 2023-01-29 MED ORDER — METOCLOPRAMIDE HCL 5 MG/ML IJ SOLN: 5.0000 mg | Freq: Three times a day (TID) | INTRAMUSCULAR | Status: DC | PRN

## 2023-01-29 MED ORDER — EPHEDRINE SULFATE-NACL 50-0.9 MG/10ML-% IV SOSY
PREFILLED_SYRINGE | INTRAVENOUS | Status: DC | PRN
  Administered 2023-01-29: 5 mg via INTRAVENOUS

## 2023-01-29 MED ORDER — CYCLOBENZAPRINE HCL 5 MG PO TABS
5.0000 mg | ORAL_TABLET | Freq: Three times a day (TID) | ORAL | Status: DC | PRN
  Administered 2023-01-29: 5 mg via ORAL
  Filled 2023-01-29: qty 1

## 2023-01-29 MED ORDER — OXYCODONE HCL 5 MG PO TABS: 5.0000 mg | ORAL_TABLET | Freq: Once | ORAL | Status: DC | PRN

## 2023-01-29 MED ORDER — GABAPENTIN 300 MG PO CAPS
300.0000 mg | ORAL_CAPSULE | Freq: Every day | ORAL | Status: DC
  Administered 2023-01-30: 300 mg via ORAL
  Filled 2023-01-29: qty 1

## 2023-01-29 MED ORDER — GABAPENTIN 300 MG PO CAPS: 300.0000 mg | ORAL_CAPSULE | ORAL | Status: DC

## 2023-01-29 MED ORDER — ATORVASTATIN CALCIUM 40 MG PO TABS
40.0000 mg | ORAL_TABLET | Freq: Every day | ORAL | Status: DC
  Administered 2023-01-29 – 2023-01-30 (×2): 40 mg via ORAL
  Filled 2023-01-29 (×2): qty 1

## 2023-01-29 MED ORDER — HYDROMORPHONE HCL 1 MG/ML IJ SOLN
INTRAMUSCULAR | Status: AC
  Filled 2023-01-29: qty 1

## 2023-01-29 MED ORDER — LACTATED RINGERS IV SOLN: INTRAVENOUS | Status: DC | PRN

## 2023-01-29 MED ORDER — BUSPIRONE HCL 5 MG PO TABS
15.0000 mg | ORAL_TABLET | Freq: Every day | ORAL | Status: DC
  Administered 2023-01-29: 15 mg via ORAL
  Filled 2023-01-29: qty 1

## 2023-01-29 MED ORDER — FENTANYL CITRATE (PF) 250 MCG/5ML IJ SOLN
INTRAMUSCULAR | Status: DC | PRN
  Administered 2023-01-29 (×5): 50 ug via INTRAVENOUS

## 2023-01-29 MED ORDER — OXYCODONE HCL 5 MG PO TABS
10.0000 mg | ORAL_TABLET | ORAL | Status: DC | PRN
  Administered 2023-01-29: 10 mg via ORAL
  Filled 2023-01-29: qty 3
  Filled 2023-01-29: qty 2

## 2023-01-29 MED ORDER — SPIRONOLACTONE 25 MG PO TABS: 25.0000 mg | ORAL_TABLET | Freq: Every day | ORAL | Status: DC

## 2023-01-29 MED ORDER — CHLORHEXIDINE GLUCONATE 0.12 % MT SOLN: 15.0000 mL | Freq: Once | OROMUCOSAL | Status: AC

## 2023-01-29 MED ORDER — MIDAZOLAM HCL 2 MG/2ML IJ SOLN
INTRAMUSCULAR | Status: DC | PRN
  Administered 2023-01-29: 2 mg via INTRAVENOUS

## 2023-01-29 MED ORDER — CEFAZOLIN SODIUM-DEXTROSE 2-4 GM/100ML-% IV SOLN
2.0000 g | INTRAVENOUS | Status: AC
  Administered 2023-01-29: 2 g via INTRAVENOUS
  Filled 2023-01-29: qty 100

## 2023-01-29 MED ORDER — FENTANYL CITRATE (PF) 250 MCG/5ML IJ SOLN
INTRAMUSCULAR | Status: AC
  Filled 2023-01-29: qty 5

## 2023-01-29 MED ORDER — ROCURONIUM BROMIDE 10 MG/ML (PF) SYRINGE
PREFILLED_SYRINGE | INTRAVENOUS | Status: DC | PRN
  Administered 2023-01-29: 30 mg via INTRAVENOUS
  Administered 2023-01-29: 10 mg via INTRAVENOUS
  Administered 2023-01-29: 70 mg via INTRAVENOUS
  Administered 2023-01-29: 20 mg via INTRAVENOUS

## 2023-01-29 MED ORDER — ORAL CARE MOUTH RINSE
15.0000 mL | Freq: Once | OROMUCOSAL | Status: DC
Start: 2023-01-29 — End: 2023-01-29

## 2023-01-29 SURGICAL SUPPLY — 87 items
BAG COUNTER SPONGE SURGICOUNT (BAG) ×1 IMPLANT
BIT DRILL QC 2.0 SHORT EVOS SM (DRILL) IMPLANT
BIT DRILL QC 2.5MM SHRT EVO SM (DRILL) IMPLANT
BLADE AVERAGE 25X9 (BLADE) IMPLANT
BNDG COHESIVE 4X5 TAN STRL (GAUZE/BANDAGES/DRESSINGS) ×1 IMPLANT
BNDG ELASTIC 3INX 5YD STR LF (GAUZE/BANDAGES/DRESSINGS) IMPLANT
BNDG ELASTIC 4INX 5YD STR LF (GAUZE/BANDAGES/DRESSINGS) IMPLANT
BNDG ESMARK 4X9 LF (GAUZE/BANDAGES/DRESSINGS) IMPLANT
BNDG GAUZE DERMACEA FLUFF 4 (GAUZE/BANDAGES/DRESSINGS) ×2 IMPLANT
BRUSH SCRUB EZ PLAIN DRY (MISCELLANEOUS) ×2 IMPLANT
CLEANER TIP ELECTROSURG 2X2 (MISCELLANEOUS) ×1 IMPLANT
CORD BIPOLAR FORCEPS 12FT (ELECTRODE) ×1 IMPLANT
COVER SURGICAL LIGHT HANDLE (MISCELLANEOUS) ×1 IMPLANT
CUFF TOURN SGL QUICK 24 (TOURNIQUET CUFF) ×1
CUFF TRNQT CYL 24X4X40X1 (TOURNIQUET CUFF) IMPLANT
DRAIN PENROSE 12X.25 LTX STRL (MISCELLANEOUS) IMPLANT
DRAPE C-ARM 42X72 X-RAY (DRAPES) ×1 IMPLANT
DRAPE C-ARMOR (DRAPES) IMPLANT
DRAPE HALF SHEET 40X57 (DRAPES) ×1 IMPLANT
DRAPE INCISE IOBAN 66X45 STRL (DRAPES) IMPLANT
DRAPE SURG ORHT 6 SPLT 77X108 (DRAPES) ×1 IMPLANT
DRAPE U-SHAPE 47X51 STRL (DRAPES) ×1 IMPLANT
DRILL QC 2.0 SHORT EVOS SM (DRILL) ×1
DRILL QC 2.5MM SHORT EVOS SM (DRILL) ×1
DRSG ADAPTIC 3X8 NADH LF (GAUZE/BANDAGES/DRESSINGS) ×1 IMPLANT
DRSG MEPITEL 4X7.2 (GAUZE/BANDAGES/DRESSINGS) IMPLANT
ELECT REM PT RETURN 9FT ADLT (ELECTROSURGICAL) ×1
ELECTRODE REM PT RTRN 9FT ADLT (ELECTROSURGICAL) ×1 IMPLANT
EVACUATOR 1/8 PVC DRAIN (DRAIN) IMPLANT
GAUZE PAD ABD 8X10 STRL (GAUZE/BANDAGES/DRESSINGS) ×1 IMPLANT
GAUZE SPONGE 4X4 12PLY STRL (GAUZE/BANDAGES/DRESSINGS) ×2 IMPLANT
GLOVE BIO SURGEON STRL SZ 6.5 (GLOVE) ×3 IMPLANT
GLOVE BIO SURGEON STRL SZ7.5 (GLOVE) ×3 IMPLANT
GLOVE BIOGEL PI IND STRL 6.5 (GLOVE) ×1 IMPLANT
GLOVE BIOGEL PI IND STRL 7.5 (GLOVE) ×1 IMPLANT
GOWN STRL REUS W/ TWL LRG LVL3 (GOWN DISPOSABLE) ×3 IMPLANT
GOWN STRL REUS W/ TWL XL LVL3 (GOWN DISPOSABLE) ×1 IMPLANT
GOWN STRL REUS W/TWL LRG LVL3 (GOWN DISPOSABLE) ×3
GOWN STRL REUS W/TWL XL LVL3 (GOWN DISPOSABLE) ×1
GUIDE PIN 1.3 (PIN) ×2
K-WIRE 1.6 (WIRE) ×3
K-WIRE FX150X1.6XTROC PNT (WIRE) ×3
KIT BASIN OR (CUSTOM PROCEDURE TRAY) ×1 IMPLANT
KIT TURNOVER KIT B (KITS) ×1 IMPLANT
KWIRE FX150X1.6XTROC PNT (WIRE) IMPLANT
LOOP VASCLR MAXI BLUE 18IN ST (MISCELLANEOUS) ×1 IMPLANT
LOOPS VASCLR MAXI BLUE 18IN ST (MISCELLANEOUS) IMPLANT
MANIFOLD NEPTUNE II (INSTRUMENTS) ×1 IMPLANT
NDL HYPO 25X1 1.5 SAFETY (NEEDLE) IMPLANT
NEEDLE HYPO 25X1 1.5 SAFETY (NEEDLE) IMPLANT
NS IRRIG 1000ML POUR BTL (IV SOLUTION) ×1 IMPLANT
PACK ORTHO EXTREMITY (CUSTOM PROCEDURE TRAY) ×1 IMPLANT
PAD ARMBOARD 7.5X6 YLW CONV (MISCELLANEOUS) ×2 IMPLANT
PAD CAST 3X4 CTTN HI CHSV (CAST SUPPLIES) IMPLANT
PAD CAST 4YDX4 CTTN HI CHSV (CAST SUPPLIES) IMPLANT
PADDING CAST COTTON 3X4 STRL (CAST SUPPLIES) ×2
PADDING CAST COTTON 4X4 STRL (CAST SUPPLIES) ×2
PIN GUIDE 1.3 (PIN) IMPLANT
PLATE HUM EVOS 2.7/3.5X16 (Plate) IMPLANT
SCREW CANNULATED 4.0X54 (Screw) IMPLANT
SCREW CORT 2.7X40 STAR T8 EVOS (Screw) IMPLANT
SCREW CORT 3.5X30 ST EVOS (Screw) IMPLANT
SCREW CORT 3.5X32 ST EVOS (Screw) IMPLANT
SCREW CORT EVOS ST 3.5X28 (Screw) IMPLANT
SCREW LOCK 3.5X36MM EVOS (Screw) IMPLANT
SCREW LOCK 3.5X44MM (Screw) IMPLANT
SCREW LOCK EVOS ST 3.5X34 (Screw) IMPLANT
SPLINT PLASTER CAST FAST 5X30 (CAST SUPPLIES) IMPLANT
SPONGE T-LAP 18X18 ~~LOC~~+RFID (SPONGE) IMPLANT
STAPLER VISISTAT 35W (STAPLE) IMPLANT
STOCKINETTE IMPERVIOUS 9X36 MD (GAUZE/BANDAGES/DRESSINGS) ×1 IMPLANT
SUCTION TUBE FRAZIER 10FR DISP (SUCTIONS) ×1 IMPLANT
SUT ETHILON 3 0 PS 1 (SUTURE) ×2 IMPLANT
SUT MON AB 2-0 CT1 36 (SUTURE) IMPLANT
SUT VIC AB 0 CT1 27 (SUTURE) ×3
SUT VIC AB 0 CT1 27XBRD ANBCTR (SUTURE) ×2 IMPLANT
SUT VIC AB 2-0 CT1 27 (SUTURE)
SUT VIC AB 2-0 CT1 TAPERPNT 27 (SUTURE) ×2 IMPLANT
SUT VIC AB CT1 27XBRD ANBCTRL (SUTURE) ×3
SYR 5ML LL (SYRINGE) IMPLANT
SYR CONTROL 10ML LL (SYRINGE) ×1 IMPLANT
TOWEL GREEN STERILE (TOWEL DISPOSABLE) ×1 IMPLANT
TOWEL GREEN STERILE FF (TOWEL DISPOSABLE) ×1 IMPLANT
TRAY FOLEY MTR SLVR 16FR STAT (SET/KITS/TRAYS/PACK) IMPLANT
VASCULAR TIE MAXI BLUE 18IN ST (MISCELLANEOUS)
WATER STERILE IRR 1000ML POUR (IV SOLUTION) ×1 IMPLANT
YANKAUER SUCT BULB TIP NO VENT (SUCTIONS) IMPLANT

## 2023-01-29 NOTE — Op Note (Signed)
Orthopaedic Surgery Operative Note (CSN: [id number] ) Date of Surgery: 01/29/2023  Admit Date: 01/29/2023   Diagnoses: Pre-Op Diagnoses: Left chronic supracondylar humerus nonunion Left humeral shaft fracture   Post-Op Diagnosis: Same  Procedures: CPT 24515-Open reduction internal fixation of left humeral shaft CPT 24430-Repair of left supracondylar humeral nonunion  Surgeons : Primary: Roby Lofts, MD  Assistant: Ulyses Southward, PA-C  Location: OR 3   Anesthesia: General   Antibiotics: Ancef 2g preop with 1 gm vancomycin powder placed topically   Tourniquet time:  Total Tourniquet Time Documented: Upper Arm (Left) - 33 minutes Upper Arm (Left) - 29 minutes Total: Upper Arm (Left) - 62 minutes  Estimated Blood Loss: 1350 mL  Complications: None  Specimens: ID Type Source Tests Collected by Time Destination  A : Left elbow #1 Tissue Arm, Left AEROBIC/ANAEROBIC CULTURE W GRAM STAIN (SURGICAL/DEEP WOUND) Dezerae Freiberger, Gillie Manners, MD 01/29/2023 [phone]   B : Left elbow #2 Tissue Arm, Left AEROBIC/ANAEROBIC CULTURE W GRAM STAIN (SURGICAL/DEEP WOUND) Roby Lofts, MD 01/29/2023 0955   C : Left elbow #3 Tissue Arm, Left AEROBIC/ANAEROBIC CULTURE W GRAM STAIN (SURGICAL/DEEP WOUND) Roby Lofts, MD 01/29/2023 [phone]      Implants: Implant Name Type Inv. Item Serial No. Manufacturer Lot No. LRB No. Used Action  PLATE HUM EVOS 3.6/6.4Q03 - [id number] Plate PLATE HUM EVOS 7.5/6.4P32  [employer]  Left 1 Implanted  SCREW CORT 3.5X32 ST EVOS - [id number] Screw SCREW CORT 3.5X32 ST EVOS  [employer]  Left 3 Implanted  SCREW CORT 3.5X30 ST EVOS - [id number] Screw SCREW CORT 3.5X30 ST EVOS  [employer]  Left 2 Implanted  SCREW CORT EVOS ST 3.5X28 - [id number] Screw SCREW CORT EVOS ST 3.5X28  [employer]  Left 1 Implanted  SCREW LOCK 3.5X36MM EVOS - [id number] Screw SCREW LOCK 3.5X36MM EVOS  [employer]   Left 1 Implanted  SCREW LOCK 3.5X44MM - [id number] Screw SCREW LOCK 3.5X44MM  [employer]  Left 1 Implanted  SCREW LOCK EVOS ST 3.5X34 - [id number] Screw SCREW LOCK EVOS ST 3.5X34  [employer]  Left 1 Implanted  SCREW CORT 2.7X40 STAR T8 EVOS - [id number] Screw SCREW CORT 2.7X40 STAR T8 EVOS  [employer]  Left 1 Implanted  SCREW CANNULATED 4.0X54 - [id number] Screw SCREW CANNULATED 4.0X54  [employer]  Left 1 Implanted     Indications for Surgery: 57 year old male who sustained a left supracondylar humerus fracture that was extra-articular that underwent open reduction internal fixation in late 2023.  He subsequently lost fixation of his hardware and had prominence of his medial plate requiring removal of hardware.  CT scan was obtained prior to this which showed bridging callus across the fracture site.  Unfortunately after remove the hardware he subsequently developed a nonunion for which we are treating that area with a bone stimulator.  He was lifting about 10 to 15 pounds when his arm snapped and he developed a humeral shaft fracture above the previous location of his hardware.  I discussed with him treatment options including nonoperative management for both his nonunion humeral shaft.  I also discussed with him open reduction internal fixation of his humeral shaft with potential repair of his nonunion.  After full discussion he wished to proceed to surgical management.  Risks and benefits were discussed.  Risks included but not limited to bleeding, infection, persistent nonunion, malunion, nerve and blood vessel  injury, DVT, elbow stiffness, need for further surgery, even the possibility anesthetic complications.  He agreed to proceed with surgery and consent was obtained.  Operative Findings: 1.  Open reduction internal fixation of right humeral shaft fracture using [employer] EVOS posterior lateral extra-articular distal  humerus plate. 2.  Repair of previous supracondylar nonunion using the above plate and a 4.0 mm partially-threaded cannulated screw across the nonunion site. 3.  Cultures taken from around the previous hardware site and the nonunion site due to a mottled appearance to the bone.  Procedure: The patient was identified in the preoperative holding area. Consent was confirmed with the patient and their family and all questions were answered. The operative extremity was marked after confirmation with the patient. he was then brought back to the operating room by our anesthesia colleagues.  He was carefully transferred over to radiolucent flattop table.  He was placed under general anesthetic.  He was then placed in the lateral decubitus position with the left side up.  An axillary roll was placed in his down extremity to prevent pressure on his neurovascular structures.  The left upper extremity was then prepped and draped in usual sterile fashion.  A timeout was performed to verify the patient, the procedure, and the extremity.  Preoperative antibiotics were dosed.  Fluoroscopic imaging showed the unstable nature of his injury.  A posterior approach to the humerus was made and carried down through skin subcutaneous tissue.  I extended the previous scar that he had.  I then performed a subcutaneous dissection along the triceps until I reached the lateral intermuscular septum.  I kept the medial side intact due to the significant scarring around the ulnar nerve.  At this point I then was able to developed the interval until I reached the nonunion site.  I continued my dissection until I encountered the radial nerve proper.  I then carefully dissected around the radial nerve and protected this throughout the case.  There was quite a lot of bleeding from the previous nonunion site and the location of the hardware.  As result and later the tourniquet to 250 mmHg.  I then proceeded to debride the nonunion site as well  as the locations of the previous hardware which had a fibrinous material that did not appear as normal scar tissue.  I sent this for specimen.  The bone also had a mottled appearance which was concerning since this appeared to be a pathologic fracture.  I took 3 samples and sent these off to the lab culture.  I then used a curette and ronguer to debride the nonunion site to get back to bone edges.  Fortunately the distal segment was very distal especially laterally making it difficult for a potential repair.  At this point I then exposed the fracture site of the humerus and was able to clean out the hematoma and reduce the fracture.  His cortical thickness was very thin at this level.  Reduction tenaculums were used to reduce it.  I then decided to use a pointed reduction tenaculum to reduce the distal nonunion site to the metaphysis at the location of the lateral condyle.  We reduced the medial side as well but this was through the lateral para tricipital approach.  I first attempted to use a left-sided posterior lateral extra-articular distal humerus plate from the [facility] set.  Unfortunately this did not contoured very well due to his previous deformity from his fracture.  I also was not can  to be able to get any fixation distally with this plate.  As result I tried to contour the contralateral side of the extra-articular distal humerus plate.  This actually fit decently and was able to get screw fixation in the articular block.  This was positioned appropriately along the posterior aspect of the humerus and slid under the radial nerve.  I confirmed anatomic reduction of the humeral shaft fracture and then drilled and placed nonlocking screw distal to the humeral shaft fracture and nonlocking screw proximal to it.  I placed a total of 4 screws and obtain x-ray showing anatomic reduction.  I then proceeded to place 4 screws above the radial nerve the radial nerve is right below the most proximal screws.   I then placed 3 locking screws distally in the metaphyseal segment below the humeral shaft fracture.  I then was able to direct a 2.7 mm lag screw into the articular block to hold the reduction.  The more proximal locking screw hole would not obtain any purchase in the articular block.  At this time considered my options in terms of fixing the medial side through a medial para tricipital approach and dissecting out the ulnar nerve potentially causing damage and potentially requiring further hardware removal if his fixation were to fail.  Since he is relatively asymptomatic from his nonunion lag screw fixation across the nonunion site from lateral to medial would be appropriate.  I used a K wire for a 4.0 mm cannulated screw and drilled and placed a partially-threaded cannulated screw to provide another point of fixation in the articular block.  I decided to leave the medial side alone as I did not want to cause any further damage and potentially cause any more soft tissue stripping and damage to the ulnar nerve.  I also felt that he was high risk for failure and potentially would need a total elbow arthroplasty in the future.  At this point I then obtained final fluoroscopic imaging.  The incision was copiously irrigated.  A gram of vancomycin powder was placed through the incision.  A layered closure of 0 Vicryl, 2-0 Monocryl and 3-0 nylon was used to close the skin.  Sterile dressing was applied a well-padded long-arm splint was then placed.  The patient was then awoke and taken to the PACU in stable condition.  Post Op Plan/Instructions: Patient will be nonweightbearing to left upper extremity.  We will start him on broad-spectrum antibiotics and follow his cultures for at least 24 hours.  Will admit him for pain control as he did not get a nerve block secondary to the potential nerve damage from a surgical approach.  He will stay in a splint for at least 2 weeks we will obtain x-rays and suture  removal.  I was present and performed the entire surgery.  Ulyses Southward, PA-C did assist me throughout the case. An assistant was necessary given the difficulty in approach, maintenance of reduction and ability to instrument the fracture.   Truitt Merle, MD [facility]

## 2023-01-29 NOTE — Plan of Care (Signed)

## 2023-01-29 NOTE — Inpatient Diabetes Management (Signed)
Inpatient Diabetes Program Recommendations  AACE/ADA: New Consensus Statement on Inpatient Glycemic Control (2015)  Target Ranges:  Prepandial:   less than 140 mg/dL      Peak postprandial:   less than 180 mg/dL (1-2 hours)      Critically ill patients:  140 - 180 mg/dL   Lab Results  Component Value Date   GLUCAP 181 (H) 01/29/2023   HGBA1C 6.5 (H) 11/12/2021   Inpatient Diabetes Program Recommendations:   Received consult regarding D/C recommendations. Would recommend continuing home medication regimen.  Thank you, Billy Fischer. Solmon Bohr, RN, MSN, CDCES  Diabetes Coordinator Inpatient Glycemic Control Team Team Pager [phone] (8am-5pm) 01/29/2023 12:54 PM

## 2023-01-29 NOTE — Addendum Note (Signed)
Addendum  created 01/29/23 1243 by Trevor Iha, MD   Clinical Note Signed

## 2023-01-29 NOTE — Anesthesia Postprocedure Evaluation (Signed)
Anesthesia Post Note  Patient: Chael Devillier.  Procedure(s) Performed: OPEN REDUCTION INTERNAL FIXATION (ORIF) DISTAL HUMERUS FRACTURE (Left)     Patient location during evaluation: PACU Anesthesia Type: General Level of consciousness: awake and alert Pain management: pain level controlled Vital Signs Assessment: post-procedure vital signs reviewed and stable Respiratory status: spontaneous breathing, nonlabored ventilation, respiratory function stable and patient connected to nasal cannula oxygen Cardiovascular status: blood pressure returned to baseline and stable Postop Assessment: no apparent nausea or vomiting Anesthetic complications: no   No notable events documented.  Last Vitals:  Vitals:   01/29/23 1215 01/29/23 1230  BP: 122/74 114/80  Pulse: 85 82  Resp: 19 15  Temp:    SpO2: 95% 94%    Last Pain:  Vitals:   01/29/23 1207  PainSc: 0-No pain                 Trevor Iha

## 2023-01-29 NOTE — Progress Notes (Signed)
Pharmacy Antibiotic Note  Tytan Garven. is a 57 y.o. male admitted on 01/29/2023 with surgical prophylaxis.  Pharmacy has been consulted for Vancomycin dosing.  Plan: Vancomycin 1 gram iv Q 12 hours x 3 days Also receiving Ceftriaxone 1 gram iv Q 24 hours Watch Scr and progress.  Height: 6' (182.9 cm) Weight: 117.9 kg (260 lb) IBW/kg (Calculated) : 77.6  Temp (24hrs), Avg:97.9 F (36.6 C), Min:97.5 F (36.4 C), Max:98.3 F (36.8 C)  Recent Labs  Lab 01/29/23 0659 01/29/23 1246  WBC 6.3 6.9  CREATININE 1.38* 1.19    Estimated Creatinine Clearance: 89.7 mL/min (by C-G formula based on SCr of 1.19 mg/dL).    Allergies  Allergen Reactions   Morphine And Codeine Anaphylaxis   Shellfish Allergy Anaphylaxis   Betadine [Povidone Iodine] Itching   Chlorhexidine Hives    Patient reports never had issues CHG with mouth rinse.   Influenza Vaccines Hives   Metoprolol Rash    Thank you Okey Regal, PharmD  01/29/2023 2:27 PM

## 2023-01-29 NOTE — Transfer of Care (Signed)
Immediate Anesthesia Transfer of Care Note  Patient: Frank [name].  Procedure(s) Performed: OPEN REDUCTION INTERNAL FIXATION (ORIF) DISTAL HUMERUS FRACTURE (Left)  Patient Location: PACU  Anesthesia Type:General  Level of Consciousness: awake  Airway & Oxygen Therapy: Patient Spontanous Breathing  Post-op Assessment: Report given to RN and Post -op Vital signs reviewed and stable  Post vital signs: Reviewed and stable  Last Vitals:  Vitals Value Taken Time  BP 123/76 01/29/23 1207  Temp    Pulse 92 01/29/23 1211  Resp 20 01/29/23 1210  SpO2 94 % 01/29/23 1211  Vitals shown include unfiled device data.  Last Pain:  Vitals:   01/29/23 0746  PainSc: 8       Patients Stated Pain Goal: 3 (01/29/23 0746)  Complications: No notable events documented.

## 2023-01-29 NOTE — Interval H&P Note (Signed)
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  Patient is indicated for open reduction internal fixation with the repair of his nonunion.  I discussed with him operative and nonoperative options.  After full discussion and risks including bleeding, infection, malunion, nonunion, pain, nerve and blood vessel injury including ulnar and radial nerve, even the possibility anesthetic complications.  He agreed to proceed with surgery and consent was obtained.  Roby Lofts, MD [facility] [phone] (office) [email or web address]

## 2023-01-29 NOTE — Anesthesia Procedure Notes (Signed)
Procedure Name: Intubation Date/Time: 01/29/2023 8:51 AM  Performed by: Loleta Linnae Rasool, CRNAPre-anesthesia Checklist: Patient identified, Patient being monitored, Timeout performed, Emergency Drugs available and Suction available Patient Re-evaluated:Patient Re-evaluated prior to induction Oxygen Delivery Method: Circle system utilized Preoxygenation: Pre-oxygenation with 100% oxygen Induction Type: IV induction Ventilation: Mask ventilation without difficulty Laryngoscope Size: Mac and 4 Grade View: Grade I Tube type: Oral Tube size: 7.5 mm Number of attempts: 1 Airway Equipment and Method: Stylet Placement Confirmation: ETT inserted through vocal cords under direct vision, positive ETCO2 and breath sounds checked- equal and bilateral Secured at: 23 cm Tube secured with: Tape Dental Injury: Teeth and Oropharynx as per pre-operative assessment

## 2023-01-29 NOTE — Evaluation (Signed)
Physical Therapy Brief Evaluation and Discharge Note Patient Details Name: Frank [name]. MRN: [id number] DOB: [birth year] Today's Date: 01/29/2023   History of Present Illness  Frank Donnivan Kekahuna. is an 57 y.o. male with PMH significant for anxiety, tobacco use disorder, T2DM, HTN, HLD presenting for surgery on the left upper extremity. Patient sustained a left distal humerus fracture following a fall in his bathtub on 01/09/22.  He underwent ORIF of left distal humerus by Dr. Jena Gauss on 01/16/2022.  Due to hardware failure, patient underwent hardware removal on 07/26/2022.  On 01/24/2023, patient noted he was picking up a 10 to 15 pound box when he suddenly felt a stabbing sensation in his left arm. Pt found to have L humerus fx and is s/p ORIF on 11/13.  Clinical Impression  Pt presents with admitting diagnosis above. Pt today was able to ambulate in hallway with no AD supervision. Pt presents at or near baseline mobility. Pt has no further acute PT needs and will be signing off. Re consult PT if mobility status changes. Follow physician recs for DC and follow up therapy.      PT Assessment All further PT needs can be met in the next venue of care  Assistance Needed at Discharge  PRN    Equipment Recommendations None recommended by PT  Recommendations for Other Services       Precautions/Restrictions Precautions Precautions: Fall Restrictions Weight Bearing Restrictions: Yes LUE Weight Bearing: Non weight bearing        Mobility  Bed Mobility   Supine/Sidelying to sit: Modified independent (Device/Increased time) Sit to supine/sidelying: Modified independent (Device/Increased time)    Transfers Overall transfer level: Needs assistance Equipment used: None Transfers: Sit to/from Stand Sit to Stand: Supervision           General transfer comment: no LOB noted.    Ambulation/Gait Ambulation/Gait assistance: Supervision Gait Distance (Feet): 75 Feet Assistive  device: None Gait Pattern/deviations: WFL(Within Functional Limits) Gait Speed: Pace WFL General Gait Details: no LOB noted. Pt a little unsteady but able to quickly self correct.  Home Activity Instructions    Stairs            Modified Rankin (Stroke Patients Only)        Balance Overall balance assessment: Mild deficits observed, not formally tested                        Pertinent Vitals/Pain PT - Brief Vital Signs All Vital Signs Stable: Yes Pain Assessment Pain Assessment: 0-10 Pain Score: 2  Pain Location: L arm Pain Descriptors / Indicators: Aching, Discomfort, Grimacing Pain Intervention(s): Premedicated before session, Monitored during session     Home Living Family/patient expects to be discharged to:: Private residence Living Arrangements: Spouse/significant other;Parent Available Help at Discharge: Family;Available PRN/intermittently Home Environment: Ramped entrance   Home Equipment: Rolling Walker (2 wheels);Cane - single point;Wheelchair - Engineer, technical sales - power;Electric scooter;Shower seat;Grab bars - toilet;Grab bars - tub/shower;Hand held shower head;BSC/3in1        Prior Function Level of Independence: Independent Comments: Occasionally uses SPC    UE/LE Assessment   UE ROM/Strength/Tone/Coordination: Impaired UE ROM/Strength/Tone/Coordination Deficits: L humerus fx s/p ORIF  LE ROM/Strength/Tone/Coordination: [facility]      Communication   Communication Communication: No apparent difficulties     Cognition Overall Cognitive Status: Appears within functional limits for tasks assessed/performed       General Comments General comments (skin integrity, edema, etc.): VSS  Exercises     Assessment/Plan    PT Problem List Decreased strength;Decreased range of motion;Decreased activity tolerance;Decreased mobility;Decreased coordination;Decreased knowledge of use of DME;Decreased safety awareness;Decreased knowledge of  precautions;Pain       PT Visit Diagnosis Other abnormalities of gait and mobility (R26.89)    No Skilled PT Patient at baseline level of functioning;Patient is independent with all acitivity/mobility   Co-evaluation                AMPAC 6 Clicks Help needed turning from your back to your side while in a flat bed without using bedrails?: None Help needed moving from lying on your back to sitting on the side of a flat bed without using bedrails?: None Help needed moving to and from a bed to a chair (including a wheelchair)?: A Little Help needed standing up from a chair using your arms (e.g., wheelchair or bedside chair)?: A Little Help needed to walk in hospital room?: A Little Help needed climbing 3-5 steps with a railing? : A Little 6 Click Score: 20      End of Session Equipment Utilized During Treatment: Gait belt Activity Tolerance: Patient tolerated treatment well Patient left: in bed;with call bell/phone within reach;with family/visitor present Nurse Communication: Mobility status PT Visit Diagnosis: Other abnormalities of gait and mobility (R26.89)     Time: 1610-9604 PT Time Calculation (min) (ACUTE ONLY): 24 min  Charges:   PT Evaluation $PT Eval Low Complexity: 1 Low PT Treatments $Gait Training: 8-22 mins    Shela Nevin, PT, DPT Acute Rehab Services [phone]   Gladys Damme  01/29/2023, 3:46 PM

## 2023-01-30 ENCOUNTER — Encounter (HOSPITAL_COMMUNITY): Payer: Self-pay | Admitting: Student

## 2023-01-30 DIAGNOSIS — S42302A Unspecified fracture of shaft of humerus, left arm, initial encounter for closed fracture: Secondary | ICD-10-CM | POA: Diagnosis not present

## 2023-01-30 LAB — CBC
HCT: 24.7 % — ABNORMAL LOW (ref 39.0–52.0)
Hemoglobin: 7.5 g/dL — ABNORMAL LOW (ref 13.0–17.0)
MCH: 25.1 pg — ABNORMAL LOW (ref 26.0–34.0)
MCHC: 30.4 g/dL (ref 30.0–36.0)
MCV: 82.6 fL (ref 80.0–100.0)
Platelets: 286 10*3/uL (ref 150–400)
RBC: 2.99 MIL/uL — ABNORMAL LOW (ref 4.22–5.81)
RDW: 17.5 % — ABNORMAL HIGH (ref 11.5–15.5)
WBC: 8.4 10*3/uL (ref 4.0–10.5)
nRBC: 0 % (ref 0.0–0.2)

## 2023-01-30 LAB — BASIC METABOLIC PANEL
Anion gap: 4 — ABNORMAL LOW (ref 5–15)
BUN: 13 mg/dL (ref 6–20)
CO2: 26 mmol/L (ref 22–32)
Calcium: 8.2 mg/dL — ABNORMAL LOW (ref 8.9–10.3)
Chloride: 101 mmol/L (ref 98–111)
Creatinine, Ser: 1.52 mg/dL — ABNORMAL HIGH (ref 0.61–1.24)
GFR, Estimated: 53 mL/min — ABNORMAL LOW (ref >60)
Glucose, Bld: 173 mg/dL — ABNORMAL HIGH (ref 70–99)
Potassium: 3.6 mmol/L (ref 3.5–5.1)
Sodium: 131 mmol/L — ABNORMAL LOW (ref 135–145)

## 2023-01-30 LAB — GLUCOSE, CAPILLARY: Glucose-Capillary: 147 mg/dL — ABNORMAL HIGH (ref 70–99)

## 2023-01-30 MED ORDER — OXYCODONE HCL 5 MG PO TABS: 5.0000 mg | ORAL_TABLET | ORAL | 0 refills | Status: DC | PRN

## 2023-01-30 MED ORDER — VERAPAMIL HCL ER 120 MG PO TBCR
120.0000 mg | EXTENDED_RELEASE_TABLET | Freq: Every day | ORAL | Status: DC
  Administered 2023-01-30: 120 mg via ORAL
  Filled 2023-01-30: qty 1

## 2023-01-30 MED ORDER — VITAMIN D (ERGOCALCIFEROL) 1.25 MG (50000 UNIT) PO CAPS: 50000.0000 [IU] | ORAL_CAPSULE | ORAL | 0 refills | Status: DC

## 2023-01-30 MED ORDER — VITAMIN D (ERGOCALCIFEROL) 1.25 MG (50000 UNIT) PO CAPS
50000.0000 [IU] | ORAL_CAPSULE | ORAL | Status: DC
  Administered 2023-01-30: 50000 [IU] via ORAL
  Filled 2023-01-30: qty 1

## 2023-01-30 MED ORDER — CYCLOBENZAPRINE HCL 5 MG PO TABS: 5.0000 mg | ORAL_TABLET | Freq: Three times a day (TID) | ORAL | 0 refills | Status: AC | PRN

## 2023-01-30 NOTE — Discharge Instructions (Signed)
Truitt Merle, MD Thyra Breed PA-C [facility] [address] [phone] Jani Files)   [phone] (fax)                                  POST-OPERATIVE INSTRUCTIONS   WEIGHT BEARING STATUS: non-weightbearing left upper extremity  RANGE OF MOTION/ACTIVITY:  Maintain splint. Ok for shoulder motion as tolerated. Use sling as needed for comfort  WOUND CARE Please keep splint clean dry and intact until follow-up. If your splint gets wet for any reason please contact the office immediately.  Do not stick anything down your splint such as pencils, momey, hangers to try and scratch yourself.  If you feel itchy take Benadryl as prescribed on the bottle for itching You may shower on Post-Op Day #2.  You must keep splint dry during this process and may find that a plastic bag taped around the extremity or alternatively a towel based bath may be a better option.   If you get your splint wet or if it is damaged please contact our clinic.  EXERCISES Due to your splint being in place you will not be able to bear weight through your extremity.   Please use your sling until follow-up. Please continue to work on range of motion of your fingers and stretch these multiple times a day to prevent stiffness. Please continue to ambulate and do not stay sitting or lying for too long. Perform foot and wrist pumps to assist in circulation.  DVT/PE prophylaxis: None  DIET: As you were eating previously.  Can use over the counter stool softeners and bowel preparations, such as Miralax, to help with bowel movements.  Narcotics can be constipating.  Be sure to drink plenty of fluids  REGIONAL ANESTHESIA (NERVE BLOCKS) The anesthesia team may have performed a nerve block for you if safe in the setting of your care.  This is a great tool used to minimize pain.  Typically the block may start wearing off overnight but the long acting medicine may last for 3-4 days.  The nerve block wearing off  can be a challenging period but please utilize your as needed pain medications to try and manage this period.    ICE AND ELEVATE INJURED/OPERATIVE EXTREMITY Using ice and elevating the injured extremity above your heart can help with swelling and pain control.  Icing in a pulsatile fashion, such as 20 minutes on and 20 minutes off, can be followed.   Do not place ice directly on skin. Make sure there is a barrier between to skin and the ice pack.   Using frozen items such as frozen peas works well as the conform nicely to the are that needs to be iced.  POST-OP MEDICATIONS- Multimodal approach to pain control In general your pain will be controlled with a combination of substances.  Prescriptions unless otherwise discussed are electronically sent to your pharmacy.  This is a carefully made plan we use to minimize narcotic use.                     -   Acetaminophen - Non-narcotic pain medicine taken on a scheduled basis        -   Oxycodone - This is a strong narcotic, to be used only on an "as needed" basis for pain.                  -  Robaxin -  Muscle relaxer taken on an "as needed" basis for muscle spasms                     STOP SMOKING OR USING NICOTINE PRODUCTS!!!!  As discussed nicotine severely impairs your body's ability to heal surgical and traumatic wounds but also impairs bone healing.  Wounds and bone heal by forming microscopic blood vessels (angiogenesis) and nicotine is a vasoconstrictor (essentially, shrinks blood vessels).  Therefore, if vasoconstriction occurs to these microscopic blood vessels they essentially disappear and are unable to deliver necessary nutrients to the healing tissue.  This is one modifiable factor that you can do to dramatically increase your chances of healing your injury.    (This means no smoking, no nicotine gum, patches, etc)   FOLLOW-UP If you develop a Fever (>101.5), Redness or Drainage from the surgical incision site, please call our office to  arrange for an evaluation. Please call the office to schedule a follow-up appointment for your incision check if you do not already have one, 7-10 days post-operatively.  CALL THE OFFICE WITH ANY QUESTIONS OR CONCERNS: [phone]   VISIT OUR WEBSITE FOR ADDITIONAL INFORMATION: [email or web address]    OTHER HELPFUL INFORMATION  If you had a block, it will wear off between 8-24 hrs postop typically.  This is period when your pain may go from nearly zero to the pain you would have had postop without the block.  This is an abrupt transition but nothing dangerous is happening.  You may take an extra dose of narcotic when this happens.  You may be more comfortable sleeping in a semi-seated position the first few nights following surgery.  Keep a pillow propped under the elbow and forearm for comfort.  If you have a recliner type of chair it might be beneficial.  If not that is fine too, but it would be helpful to sleep propped up with pillows behind your operated shoulder as well under your elbow and forearm.  This will reduce pulling on the suture lines.  When dressing, put your operative arm in the sleeve first.  When getting undressed, take your operative arm out last.  Loose fitting, button-down shirts are recommended.  Often in the first days after surgery you may be more comfortable keeping your operative arm under your shirt and not through the sleeve.  You may return to work/school in the next couple of days when you feel up to it.  Desk work and typing in the sling is fine.  We suggest you use the pain medication the first night prior to going to bed, in order to ease any pain when the anesthesia wears off. You should avoid taking pain medications on an empty stomach as it will make you nauseous. You should wean off your narcotic medicines as soon as you are able.  Most patients will be off or using minimal narcotics before their first postop appointment.   Do not drink alcoholic beverages  or take illicit drugs when taking pain medications.  It is against the law to drive while taking narcotics.  In some states it is against the law to drive while your arm is in a sling.   Pain medication may make you constipated.  Below are a few solutions to try in this order: Decrease the amount of pain medication if you aren't having pain. Drink lots of decaffeinated fluids. Drink prune juice and/or each dried prunes  If the first 3 don't work start with additional  solutions -   Take Colace - an over-the-counter stool softener -   Take Senokot - an over-the-counter laxative -   Take Miralax - a stronger over-the-counter laxative

## 2023-01-30 NOTE — Discharge Summary (Signed)
[facility]edic [facility] (OTS) Discharge Summary   Patient ID: Frank [name]. MRN: [id number] DOB/AGE: 10-13-1965 57 y.o.  Admit date: 01/29/2023 Discharge date: 01/30/2023  Admission Diagnoses: Left distal humerus fracture  Discharge Diagnoses:  Principal Problem:   Closed fracture of left distal humerus   Past Medical History:  Diagnosis Date   Anxiety    Arthritis    Asthma    Bipolar disorder (HCC)    Current every day smoker    Depression    Diabetes mellitus, type II (HCC)    Dyspnea    History of kidney stones    History of pneumonia    Hyperlipidemia    Hypertension    Morbid obesity (HCC)    Sedentary lifestyle    Seizures (HCC)    last seizure 12 yrs ago, no current problem   Sleep apnea    uses CPAP nightly     Procedures Performed:  CPT 24515-Open reduction internal fixation of left humeral shaft CPT 24430-Repair of left supracondylar humeral nonunion    Discharged Condition: stable  Hospital Course: Patient presented to [facility] on 01/29/2023 for scheduled procedure left upper extremity.  The single operating room by Dr. Jena Gauss for the above procedure.  He tolerated this well complications.  Placed in a long-arm splint postoperatively instructed be nonweightbearing on the left upper extremity.  He was admitted overnight for pain control and therapies.  Intraoperative cultures were obtained which had no growth x 24 hours and patient evaluated on postoperative day #1.  Was seen by PT on afternoon of surgery and evaluated by OT on postoperative day #1.  Patient did well with both of these. On 01/30/2023, the patient was tolerating diet, working well with therapies, pain well controlled, vital signs stable, dressings clean, dry, intact and felt stable for discharge to home. Patient will follow up as below and knows to call with questions or concerns.     Consults: None  Significant Diagnostic Studies:   Results for orders  placed or performed during the hospital encounter of 01/29/23 (from the past 168 hour(s))  CBC per protocol   Collection Time: 01/29/23  6:59 AM  Result Value Ref Range   WBC 6.3 4.0 - 10.5 K/uL   RBC 3.84 (L) 4.22 - 5.81 MIL/uL   Hemoglobin 9.7 (L) 13.0 - 17.0 g/dL   HCT 03.4 (L) 74.2 - 59.5 %   MCV 83.1 80.0 - 100.0 fL   MCH 25.3 (L) 26.0 - 34.0 pg   MCHC 30.4 30.0 - 36.0 g/dL   RDW 63.8 (H) 75.6 - 43.3 %   Platelets 299 150 - 400 K/uL   nRBC 0.0 0.0 - 0.2 %  Basic metabolic panel per protocol   Collection Time: 01/29/23  6:59 AM  Result Value Ref Range   Sodium 135 135 - 145 mmol/L   Potassium 4.1 3.5 - 5.1 mmol/L   Chloride 104 98 - 111 mmol/L   CO2 25 22 - 32 mmol/L   Glucose, Bld 115 (H) 70 - 99 mg/dL   BUN 12 6 - 20 mg/dL   Creatinine, Ser 2.95 (H) 0.61 - 1.24 mg/dL   Calcium 8.4 (L) 8.9 - 10.3 mg/dL   GFR, Estimated 59 (L) >60 mL/min   Anion gap 6 5 - 15  Glucose, capillary   Collection Time: 01/29/23  7:31 AM  Result Value Ref Range   Glucose-Capillary 113 (H) 70 - 99 mg/dL  Type and screen [facility]  Collection Time: 01/29/23 10:45 AM  Result Value Ref Range   ABO/RH(D) A POS    Antibody Screen NEG    Sample Expiration      02/01/2023,2359 Performed at [facility], [address] [place], [place] [address]   Aerobic/Anaerobic Culture w Gram Stain (surgical/deep wound)   Collection Time: 01/29/23 11:15 AM   Specimen: Arm, Left; Tissue  Result Value Ref Range   Specimen Description WOUND    Special Requests LT ELBOW A 1    Gram Stain NO WBC SEEN NO ORGANISMS SEEN     Culture      NO GROWTH < 24 HOURS Performed at [facility], [address], [place], [place] [address]    Report Status PENDING   Aerobic/Anaerobic Culture w Gram Stain (surgical/deep wound)   Collection Time: 01/29/23 11:15 AM   Specimen: Arm, Left; Tissue  Result Value Ref Range   Specimen Description WOUND    Special Requests LT ELBOW 3 C    Gram  Stain      RARE WBC PRESENT, PREDOMINANTLY PMN NO ORGANISMS SEEN    Culture      NO GROWTH < 24 HOURS Performed at [facility], [address], [place], [place] [address]    Report Status PENDING   Aerobic/Anaerobic Culture w Gram Stain (surgical/deep wound)   Collection Time: 01/29/23 11:15 AM   Specimen: Arm, Left; Tissue  Result Value Ref Range   Specimen Description WOUND    Special Requests LT ELBOW 2 B    Gram Stain      RARE WBC PRESENT, PREDOMINANTLY PMN NO ORGANISMS SEEN    Culture      NO GROWTH < 24 HOURS Performed at [facility], [address], [place], [place] [address]    Report Status PENDING   Glucose, capillary   Collection Time: 01/29/23 12:13 PM  Result Value Ref Range   Glucose-Capillary 181 (H) 70 - 99 mg/dL  CBC   Collection Time: 01/29/23 12:46 PM  Result Value Ref Range   WBC 6.9 4.0 - 10.5 K/uL   RBC 3.44 (L) 4.22 - 5.81 MIL/uL   Hemoglobin 8.8 (L) 13.0 - 17.0 g/dL   HCT 83.1 (L) 51.7 - 61.6 %   MCV 84.9 80.0 - 100.0 fL   MCH 25.6 (L) 26.0 - 34.0 pg   MCHC 30.1 30.0 - 36.0 g/dL   RDW 07.3 (H) 71.0 - 62.6 %   Platelets 316 150 - 400 K/uL   nRBC 0.0 0.0 - 0.2 %  Comprehensive metabolic panel   Collection Time: 01/29/23 12:46 PM  Result Value Ref Range   Sodium 135 135 - 145 mmol/L   Potassium 4.1 3.5 - 5.1 mmol/L   Chloride 104 98 - 111 mmol/L   CO2 25 22 - 32 mmol/L   Glucose, Bld 163 (H) 70 - 99 mg/dL   BUN 13 6 - 20 mg/dL   Creatinine, Ser 9.48 0.61 - 1.24 mg/dL   Calcium 8.3 (L) 8.9 - 10.3 mg/dL   Total Protein 7.3 6.5 - 8.1 g/dL   Albumin 2.5 (L) 3.5 - 5.0 g/dL   AST 10 (L) 15 - 41 U/L   ALT 9 0 - 44 U/L   Alkaline Phosphatase 34 (L) 38 - 126 U/L   Total Bilirubin 0.6 <1.2 mg/dL   GFR, Estimated >54 >62 mL/min   Anion gap 6 5 - 15  Hemoglobin A1c   Collection Time: 01/29/23  2:40 PM  Result Value Ref Range   Hgb A1c MFr Bld 6.7 (H) 4.8 - 5.6 %   Mean Plasma Glucose 145.59 mg/dL  Sedimentation rate    Collection Time: 01/29/23  2:40 PM  Result Value Ref Range   Sed Rate 18 (H) 0 - 16 mm/hr  C-reactive protein   Collection Time: 01/29/23  2:40 PM  Result Value Ref Range   CRP <0.5 <1.0 mg/dL  VITAMIN D 25 Hydroxy (Vit-D Deficiency, Fractures)   Collection Time: 01/29/23  2:40 PM  Result Value Ref Range   Vit D, 25-Hydroxy 17.54 (L) 30 - 100 ng/mL  Glucose, capillary   Collection Time: 01/29/23  4:25 PM  Result Value Ref Range   Glucose-Capillary 145 (H) 70 - 99 mg/dL  Glucose, capillary   Collection Time: 01/29/23  9:31 PM  Result Value Ref Range   Glucose-Capillary 137 (H) 70 - 99 mg/dL  CBC   Collection Time: 01/30/23  9:46 AM  Result Value Ref Range   WBC 8.4 4.0 - 10.5 K/uL   RBC 2.99 (L) 4.22 - 5.81 MIL/uL   Hemoglobin 7.5 (L) 13.0 - 17.0 g/dL   HCT 52.8 (L) 41.3 - 24.4 %   MCV 82.6 80.0 - 100.0 fL   MCH 25.1 (L) 26.0 - 34.0 pg   MCHC 30.4 30.0 - 36.0 g/dL   RDW 01.0 (H) 27.2 - 53.6 %   Platelets 286 150 - 400 K/uL   nRBC 0.0 0.0 - 0.2 %  Basic metabolic panel   Collection Time: 01/30/23  9:46 AM  Result Value Ref Range   Sodium 131 (L) 135 - 145 mmol/L   Potassium 3.6 3.5 - 5.1 mmol/L   Chloride 101 98 - 111 mmol/L   CO2 26 22 - 32 mmol/L   Glucose, Bld 173 (H) 70 - 99 mg/dL   BUN 13 6 - 20 mg/dL   Creatinine, Ser 6.44 (H) 0.61 - 1.24 mg/dL   Calcium 8.2 (L) 8.9 - 10.3 mg/dL   GFR, Estimated 53 (L) >60 mL/min   Anion gap 4 (L) 5 - 15  Glucose, capillary   Collection Time: 01/30/23 10:31 AM  Result Value Ref Range   Glucose-Capillary 147 (H) 70 - 99 mg/dL     Treatments: IV hydration, antibiotics: vancomycin and ceftriaxone, analgesia: acetaminophen, Dilaudid, and oxycodone, anticoagulation: ASA, therapies: PT and OT, and surgery: As above  Discharge Exam: General: Resting in bed, no acute distress Respiratory: No increased work of breathing at rest Left upper extremity: Long-arm splint and sling in place.  Nontender above splint.  Endorses sensation  over the axillary nerve distribution.  Endorses sensation to light touch over all fingers.  Able to wiggle the fingers.  Fingers warm well-perfused.   Disposition: Discharge disposition: 01-Home or Self Care       Discharge Instructions     Call MD / Call 911   Complete by: As directed    If you experience chest pain or shortness of breath, CALL 911 and be transported to the hospital emergency room.  If you develope a fever above 101 F, pus (white drainage) or increased drainage or redness at the wound, or calf pain, call your surgeon's office.   Constipation Prevention   Complete by: As directed    Drink plenty of fluids.  Prune juice may be helpful.  You may use a stool softener, such as Colace (over the counter) 100 mg twice a day.  Use MiraLax (over the counter) for constipation  as needed.   Diet - low sodium heart healthy   Complete by: As directed    Increase activity slowly as tolerated   Complete by: As directed    Post-operative opioid taper instructions:   Complete by: As directed    POST-OPERATIVE OPIOID TAPER INSTRUCTIONS: It is important to wean off of your opioid medication as soon as possible. If you do not need pain medication after your surgery it is ok to stop day one. Opioids include: Codeine, Hydrocodone(Norco, Vicodin), Oxycodone(Percocet, oxycontin) and hydromorphone amongst others.  Long term and even short term use of opiods can cause: Increased pain response Dependence Constipation Depression Respiratory depression And more.  Withdrawal symptoms can include Flu like symptoms Nausea, vomiting And more Techniques to manage these symptoms Hydrate well Eat regular healthy meals Stay active Use relaxation techniques(deep breathing, meditating, yoga) Do Not substitute Alcohol to help with tapering If you have been on opioids for less than two weeks and do not have pain than it is ok to stop all together.  Plan to wean off of opioids This plan should  start within one week post op of your joint replacement. Maintain the same interval or time between taking each dose and first decrease the dose.  Cut the total daily intake of opioids by one tablet each day Next start to increase the time between doses. The last dose that should be eliminated is the evening dose.         Allergies as of 01/30/2023       Reactions   Morphine And Codeine Anaphylaxis   Shellfish Allergy Anaphylaxis   Betadine [povidone Iodine] Itching   Chlorhexidine Hives   Patient reports never had issues CHG with mouth rinse.   Influenza Vaccines Hives   Metoprolol Rash        Medication List     TAKE these medications    acetaminophen 325 MG tablet Commonly known as: Tylenol Take 2 tablets (650 mg total) by mouth every 6 (six) hours as needed.   albuterol 108 (90 Base) MCG/ACT inhaler Commonly known as: VENTOLIN HFA Inhale 2 puffs into the lungs every 6 (six) hours as needed for wheezing or shortness of breath.   albuterol (2.5 MG/3ML) 0.083% nebulizer solution Commonly known as: PROVENTIL 2.5 mg every 2 (two) hours as needed for wheezing or shortness of breath.   amLODipine 10 MG tablet Commonly known as: NORVASC Take 10 mg by mouth at bedtime.   aspirin EC 81 MG tablet Take 81 mg by mouth 2 (two) times a week. Swallow whole.   atorvastatin 40 MG tablet Commonly known as: LIPITOR Take 40 mg by mouth daily.   busPIRone 15 MG tablet Commonly known as: BUSPAR Take 15 mg by mouth 2 (two) times daily.   celecoxib 200 MG capsule Commonly known as: CELEBREX Take 200 mg by mouth daily. Additional 200 mg if needed a bedtime   clonazePAM 1 MG tablet Commonly known as: KLONOPIN Take 1 mg by mouth once as needed for anxiety.   cyclobenzaprine 5 MG tablet Commonly known as: FLEXERIL Take 1 tablet (5 mg total) by mouth 3 (three) times daily as needed for muscle spasms.   divalproex 500 MG DR tablet Commonly known as: DEPAKOTE Take 500 mg by  mouth 2 (two) times daily.   EPINEPHrine 0.3 mg/0.3 mL Soaj injection Commonly known as: EPI-PEN Inject 0.3 mg into the muscle as needed for anaphylaxis.   esomeprazole 40 MG capsule Commonly known as: NEXIUM Take 40 mg  by mouth in the morning. Additional 40 mg if needed at bedtime   Fluticasone-Salmeterol 500-50 MCG/DOSE Aepb Commonly known as: ADVAIR Inhale 1 puff into the lungs in the morning.   furosemide 40 MG tablet Commonly known as: LASIX Take 40 mg by mouth daily as needed for fluid or edema.   gabapentin 300 MG capsule Commonly known as: NEURONTIN Take 300-600 mg by mouth See admin instructions. 300 mg in the morning, 600 mg at bedtime   hydrALAZINE 10 MG tablet Commonly known as: APRESOLINE Take 10 mg by mouth 3 (three) times daily.   hydrochlorothiazide 25 MG tablet Commonly known as: HYDRODIURIL Take 25 mg by mouth daily.   hydrOXYzine 25 MG tablet Commonly known as: ATARAX Take 25 mg by mouth at bedtime.   lisinopril 20 MG tablet Commonly known as: ZESTRIL Take 20 mg by mouth daily.   metFORMIN 1000 MG tablet Commonly known as: GLUCOPHAGE Take 1,000 mg by mouth 2 (two) times daily with a meal.   montelukast 10 MG tablet Commonly known as: SINGULAIR Take 10 mg by mouth at bedtime.   naproxen 375 MG tablet Commonly known as: NAPROSYN Take 1 tablet (375 mg total) by mouth 2 (two) times daily for 7 days.   OVER THE COUNTER MEDICATION Take 1 capsule by mouth at bedtime. Burdock Root Supplement   oxyCODONE 5 MG immediate release tablet Commonly known as: Roxicodone Take 1 tablet (5 mg total) by mouth every 4 (four) hours as needed for severe pain (pain score 7-10).   PARoxetine 40 MG tablet Commonly known as: PAXIL Take 40 mg by mouth every morning.   sertraline 25 MG tablet Commonly known as: ZOLOFT Take 25 mg by mouth daily.   spironolactone 25 MG tablet Commonly known as: ALDACTONE Take 25 mg by mouth at bedtime.   tamsulosin 0.4 MG Caps  capsule Commonly known as: FLOMAX Take 0.4 mg by mouth at bedtime.   testosterone cypionate 200 MG/ML injection Commonly known as: DEPOTESTOSTERONE CYPIONATE Inject 0.5 mLs (100 mg total) into the muscle every 14 (fourteen) days.   verapamil 120 MG CR tablet Commonly known as: CALAN-SR Take 120 mg by mouth daily.   Vitamin D (Ergocalciferol) 1.25 MG (50000 UNIT) Caps capsule Commonly known as: DRISDOL Take 1 capsule (50,000 Units total) by mouth every Thursday. Start taking on: February 06, 2023        Follow-up Information     Haddix, Gillie Manners, MD. Go on 02/11/2023.   Specialty: Orthopedic Surgery Why: for wound check and splint removal Contact information: [address] [place] [address] [phone]         Arlina Robes, MD Follow up.   Specialty: Family Medicine Contact information: [address] [place] [address] [phone]                 Discharge Instructions and Plan: Patient will be discharged to home.  Will continue home dose aspirin 81 mg daily.  No additional DVT prophylaxis needed for this ambulatory patient.  Patient will follow-up with Dr. Jena Gauss in 2 weeks for repeat x-rays, splint and suture removal.   Signed:  Thompson Caul, PA-C ?([phone]? (phone) 01/30/2023, 11:38 AM  [facility] [address] [place] [address] [phone] Collier Bullock (F)

## 2023-01-30 NOTE — Progress Notes (Signed)
Discharge instructions given. Patient verbalized understanding and all questions were answered.  ?

## 2023-01-30 NOTE — Evaluation (Addendum)
Occupational Therapy Evaluation Patient Details Name: Frank [name]. MRN: [id number] DOB: [birth year] Today's Date: 01/30/2023   History of Present Illness Pt is a 57 y/o male presenting on 11/13 after stabbing pain in L arm.  Found with L humerus fx, s/p ORIF 11/13. Noted initial fx 12/2021, with hardware failure and removal 07/2022.  PMH includes: anxiety, tobacco use disorder, DM2, HTN.   Clinical Impression   PTA Patient independent with ADLs and mobility. Admitted for above and presents with problem list below.  Pt educated on sling mgmt, postioning of UE, edema reduction and exercises. Patient able to complete ADLs with up to min assist, transfers and mobility with supervision.  AAROM and educated on SROM for L shoulder ROM as tolerated, verbal order for shoulder movement per PA 11/14.  Pt has good support from family at home, and anticipate no further acute OT Needs after dc home until cleared by surgeon.  Will follow acutely.      If plan is discharge home, recommend the following: A little help with bathing/dressing/bathroom;Assistance with cooking/housework;Assist for transportation;Help with stairs or ramp for entrance    Functional Status Assessment  Patient has had a recent decline in their functional status and demonstrates the ability to make significant improvements in function in a reasonable and predictable amount of time.  Equipment Recommendations  None recommended by OT    Recommendations for Other Services       Precautions / Restrictions Precautions Precautions: Fall Precaution Comments: L UE long arm splint, OKAY for shoulder and hand ROM Required Braces or Orthoses: Sling;Splint/Cast Restrictions Weight Bearing Restrictions: Yes LUE Weight Bearing: Non weight bearing      Mobility Bed Mobility Overal bed mobility: Modified Independent                  Transfers Overall transfer level: Needs assistance Equipment used: None Transfers: Sit  to/from Stand Sit to Stand: Supervision                  Balance Overall balance assessment: Mild deficits observed, not formally tested                                         ADL either performed or assessed with clinical judgement   ADL Overall ADL's : Needs assistance/impaired     Grooming: Supervision/safety;Set up;Standing           Upper Body Dressing : Minimal assistance;Sitting   Lower Body Dressing: Supervision/safety;Sit to/from stand Lower Body Dressing Details (indicate cue type and reason): able to don/doff socks using R Hand and figure 4 technique, discussed safety sitting for dressing and using R hand only Toilet Transfer: Supervision/safety;Ambulation           Functional mobility during ADLs: Supervision/safety;Cueing for safety       Vision Baseline Vision/History: 1 Wears glasses Vision Assessment?: No apparent visual deficits     Perception         Praxis         Pertinent Vitals/Pain Pain Assessment Pain Assessment: 0-10 Pain Score: 1  Pain Location: L arm Pain Descriptors / Indicators: Aching, Discomfort, Grimacing Pain Intervention(s): Limited activity within patient's tolerance, Monitored during session, Repositioned     Extremity/Trunk Assessment Upper Extremity Assessment Upper Extremity Assessment: Right hand dominant;LUE deficits/detail LUE Deficits / Details: splinted from upper humerus to hand, pt able to make fist and extend  fingers; AAROM to raise arm at shoulder to 90* LUE: Unable to fully assess due to immobilization LUE Sensation: WNL LUE Coordination: decreased fine motor;decreased gross motor   Lower Extremity Assessment Lower Extremity Assessment: Defer to PT evaluation       Communication Communication Communication: No apparent difficulties   Cognition Arousal: Alert Behavior During Therapy: WFL for tasks assessed/performed Overall Cognitive Status: Within Functional Limits for  tasks assessed                                       General Comments  reached out to ortho PA about "pop" pt felt in UE when raising arm from shoulder- pt with no increased pain or discomfort; also asked ortho PA to order an XL sling for pt; educated pt on positioning of UE with increased elevation to increase comfort    Exercises     Shoulder Instructions      Home Living Family/patient expects to be discharged to:: Private residence Living Arrangements: Spouse/significant other Available Help at Discharge: Family;Available PRN/intermittently Type of Home: Mobile home Home Access: Ramped entrance     Home Layout: One level     Bathroom Shower/Tub: Walk-in shower;Tub only   Bathroom Toilet: Handicapped height Bathroom Accessibility: Yes   Home Equipment: Agricultural consultant (2 wheels);Cane - single point;Wheelchair - Engineer, technical sales - power;Electric scooter;Shower seat;Grab bars - toilet;Grab bars - tub/shower;Hand held shower head;BSC/3in1   Additional Comments: pt and spouse take care of spouses mother      Prior Functioning/Environment Prior Level of Function : Independent/Modified Independent             Mobility Comments: Mostly ind with no AD however uses SPC occasionally. ADLs Comments: Ind ADLs, light IADLs        OT Problem List: Decreased strength;Decreased range of motion;Decreased knowledge of use of DME or AE;Decreased knowledge of precautions;Pain;Impaired UE functional use      OT Treatment/Interventions: Therapeutic exercise;Self-care/ADL training;DME and/or AE instruction;Therapeutic activities;Patient/family education;Balance training    OT Goals(Current goals can be found in the care plan section) Acute Rehab OT Goals Patient Stated Goal: home OT Goal Formulation: With patient Time For Goal Achievement: 02/13/23 Potential to Achieve Goals: Good  OT Frequency: Min 1X/week    Co-evaluation              AM-PAC OT "6  Clicks" Daily Activity     Outcome Measure Help from another person eating meals?: A Little Help from another person taking care of personal grooming?: A Little Help from another person toileting, which includes using toliet, bedpan, or urinal?: A Little Help from another person bathing (including washing, rinsing, drying)?: A Little Help from another person to put on and taking off regular upper body clothing?: A Little Help from another person to put on and taking off regular lower body clothing?: A Little 6 Click Score: 18   End of Session Equipment Utilized During Treatment: Other (comment) (sling) Nurse Communication: Mobility status  Activity Tolerance: Patient tolerated treatment well Patient left: in bed;with call bell/phone within reach  OT Visit Diagnosis: Other abnormalities of gait and mobility (R26.89);Muscle weakness (generalized) (M62.81);Pain Pain - Right/Left: Left Pain - part of body: Arm                Time: 1610-9604 OT Time Calculation (min): 27 min Charges:  OT General Charges $OT Visit: 1 Visit OT Evaluation $OT Eval Moderate Complexity:  1 Mod OT Treatments $Self Care/Home Management : 8-22 mins  Frank [name], OT Acute Rehabilitation Services Office [phone]   Frank [name] 01/30/2023, 10:19 AM

## 2023-01-30 NOTE — Plan of Care (Signed)
  Problem: Nutrition: Goal: Adequate nutrition will be maintained Outcome: Progressing   Problem: Coping: Goal: Level of anxiety will decrease Outcome: Progressing   Problem: Elimination: Goal: Will not experience complications related to bowel motility Outcome: Progressing   Problem: Pain Management: Goal: General experience of comfort will improve Outcome: Progressing   Problem: Safety: Goal: Ability to remain free from injury will improve Outcome: Progressing

## 2023-01-30 NOTE — Progress Notes (Signed)
Orthopedic Tech Progress Note Patient Details:  Frank [name]. 23-Dec-1965 [id number]  Ortho Devices Type of Ortho Device: Arm sling Ortho Device/Splint Location: LUE Ortho Device/Splint Interventions: Ordered, Application   Post Interventions Patient Tolerated: Well  Jowana Thumma A Teyanna Thielman 01/30/2023, 11:23 AM

## 2023-02-03 LAB — AEROBIC/ANAEROBIC CULTURE W GRAM STAIN (SURGICAL/DEEP WOUND)
Culture: NO GROWTH
Culture: NO GROWTH
Culture: NO GROWTH
Gram Stain: NONE SEEN

## 2023-02-03 LAB — POCT I-STAT EG7
Acid-Base Excess: 0 mmol/L (ref 0.0–2.0)
Bicarbonate: 26.1 mmol/L (ref 20.0–28.0)
Calcium, Ion: 1.21 mmol/L (ref 1.15–1.40)
HCT: 30 % — ABNORMAL LOW (ref 39.0–52.0)
Hemoglobin: 10.2 g/dL — ABNORMAL LOW (ref 13.0–17.0)
O2 Saturation: 93 %
Potassium: 4.3 mmol/L (ref 3.5–5.1)
Sodium: 139 mmol/L (ref 135–145)
TCO2: 28 mmol/L (ref 22–32)
pCO2, Ven: 50.5 mm[Hg] (ref 44–60)
pH, Ven: 7.32 (ref 7.25–7.43)
pO2, Ven: 72 mm[Hg] — ABNORMAL HIGH (ref 32–45)

## 2023-05-28 ENCOUNTER — Encounter: Admitting: Physician Assistant

## 2023-06-05 ENCOUNTER — Ambulatory Visit: Admitting: Orthopedic Surgery

## 2023-06-05 ENCOUNTER — Other Ambulatory Visit (INDEPENDENT_AMBULATORY_CARE_PROVIDER_SITE_OTHER): Payer: Self-pay

## 2023-06-05 VITALS — BP 139/87 | HR 94 | Ht 72.0 in | Wt 250.6 lb

## 2023-06-05 DIAGNOSIS — M545 Low back pain, unspecified: Secondary | ICD-10-CM | POA: Diagnosis not present

## 2023-06-05 MED ORDER — METHYLPREDNISOLONE 4 MG PO TBPK: ORAL_TABLET | ORAL | 0 refills | Status: DC

## 2023-06-05 NOTE — Progress Notes (Signed)
 Orthopedic Spine Surgery Office Note  Assessment: Patient is a 58 y.o. male with chronic, stable low back pain. No radicular symptoms   Plan: -Patient has tried tylenol, ibuprofen -Prescribed a medrol dose pak for additional pain relief -Asked him to sign a release so I can see his imaging/results of where he was told there was a lesion on his spine -Would need to be nicotine free and lose weight prior to any elective spine surgery -Patient should return to office on an as needed basis   Patient expressed understanding of the plan and all questions were answered to the patient's satisfaction.   ___________________________________________________________________________   History:  Patient is a 58 y.o. male who presents today for lumbar spine. Patient has had several years of low back pain. He notes it when he gets up first thing in the morning and when he gets out of a chair. He says he also notices it if he is particularly active. He feels it in the lower lumbar region. No pain radiating into either lower extremity. No trauma or injury preceded the onset of pain. He says this pain is tolerable. His bigger concern is that he went for a routine CT screening exam of his lungs after which he was told there was a lesion on his spine. He said he had subsequent imaging that did not show any lesion or abnormalities.    Weakness: yes, legs feel weaker sometimes Symptoms of imbalance: denies Paresthesias and numbness: yes, has neuropathy  Bowel or bladder incontinence: denies Saddle anesthesia: denies  Treatments tried: tylenol, ibuprofen  Review of systems: Denies fevers and chills, night sweats, unexplained weight loss, history of cancer. Has had pain that wakes him up at night  Past medical history: Bipolar disorder DM (last A1c was 6.7 on 01/29/2023)  HLD HTN CAD Neuropathy COPD OSA Asthma  Allergies: morphine, betadine, chlorhexidine, influenza, metoprolol  Past surgical  history:  Left supracondylar humerus fx ORIF Left distal humerus hardware removal Left distal humerus fracture nonunion repair Left THA Left TKA Bilateral knee arthroscopy   Social history: Reports use of nicotine product (smoking, vaping, patches, smokeless) Alcohol use: yes, approximately 2 drinks per week Reports marijuana use, denies other recreational drug use   Physical Exam:  BMI of 34.0  General: no acute distress, appears stated age Neurologic: alert, answering questions appropriately, following commands Respiratory: unlabored breathing on room air, symmetric chest rise Psychiatric: appropriate affect, normal cadence to speech   MSK (spine):  -Strength exam      Left  Right EHL    5/5  5/5 TA    5/5  5/5 GSC    5/5  5/5 Knee extension  5/5  5/5 Hip flexion   5/5  5/5  -Sensory exam    Sensation intact to light touch in L3-S1 nerve distributions of bilateral lower extremities  -Achilles DTR: 1/4 on the left, 1/4 on the right -Patellar tendon DTR: 1/4 on the left, 1/4 on the right  -Straight leg raise: negative bilaterally -Clonus: no beats bilaterally  -Left hip exam: no pain through range of motion -Right hip exam: no pain through range of motion  Imaging: XRs of the lumbar spine from 06/05/2023 were independently reviewed and interpreted, showing grade 1 spondylolisthesis at L5/S1. Disc height loss at L5/S1. No other significant degenerative changes seen. No lesions seen within the vertebral bodies. No fracture or dislocation seen.    Patient name: Frank [name]. Patient MRN: [id number] Date of visit: 06/05/23

## 2023-07-02 ENCOUNTER — Encounter (HOSPITAL_COMMUNITY): Payer: Self-pay

## 2023-07-03 ENCOUNTER — Other Ambulatory Visit: Payer: Self-pay

## 2023-07-03 ENCOUNTER — Inpatient Hospital Stay (HOSPITAL_COMMUNITY)
Admission: EM | Admit: 2023-07-03 | Discharge: 2023-11-07 | DRG: 003 | Disposition: A | Discharge status: Skilled Nursing Facility | Source: Other Acute Inpatient Hospital | Attending: Internal Medicine | Admitting: Internal Medicine

## 2023-07-03 DIAGNOSIS — Y95 Nosocomial condition: Secondary | ICD-10-CM | POA: Diagnosis not present

## 2023-07-03 DIAGNOSIS — J440 Chronic obstructive pulmonary disease with acute lower respiratory infection: Secondary | ICD-10-CM | POA: Diagnosis not present

## 2023-07-03 DIAGNOSIS — D709 Neutropenia, unspecified: Secondary | ICD-10-CM | POA: Diagnosis not present

## 2023-07-03 DIAGNOSIS — Z8249 Family history of ischemic heart disease and other diseases of the circulatory system: Secondary | ICD-10-CM

## 2023-07-03 DIAGNOSIS — Z992 Dependence on renal dialysis: Secondary | ICD-10-CM | POA: Diagnosis not present

## 2023-07-03 DIAGNOSIS — B449 Aspergillosis, unspecified: Secondary | ICD-10-CM | POA: Diagnosis not present

## 2023-07-03 DIAGNOSIS — J90 Pleural effusion, not elsewhere classified: Secondary | ICD-10-CM | POA: Diagnosis not present

## 2023-07-03 DIAGNOSIS — Z79899 Other long term (current) drug therapy: Secondary | ICD-10-CM

## 2023-07-03 DIAGNOSIS — I5032 Chronic diastolic (congestive) heart failure: Secondary | ICD-10-CM | POA: Diagnosis present

## 2023-07-03 DIAGNOSIS — Z9911 Dependence on respirator [ventilator] status: Secondary | ICD-10-CM

## 2023-07-03 DIAGNOSIS — D72829 Elevated white blood cell count, unspecified: Secondary | ICD-10-CM | POA: Diagnosis not present

## 2023-07-03 DIAGNOSIS — N170 Acute kidney failure with tubular necrosis: Secondary | ICD-10-CM | POA: Insufficient documentation

## 2023-07-03 DIAGNOSIS — B49 Unspecified mycosis: Secondary | ICD-10-CM

## 2023-07-03 DIAGNOSIS — L89313 Pressure ulcer of right buttock, stage 3: Secondary | ICD-10-CM | POA: Diagnosis not present

## 2023-07-03 DIAGNOSIS — B440 Invasive pulmonary aspergillosis: Secondary | ICD-10-CM | POA: Diagnosis not present

## 2023-07-03 DIAGNOSIS — J690 Pneumonitis due to inhalation of food and vomit: Secondary | ICD-10-CM | POA: Diagnosis not present

## 2023-07-03 DIAGNOSIS — D6869 Other thrombophilia: Secondary | ICD-10-CM | POA: Diagnosis present

## 2023-07-03 DIAGNOSIS — K7689 Other specified diseases of liver: Secondary | ICD-10-CM | POA: Diagnosis present

## 2023-07-03 DIAGNOSIS — Z6841 Body Mass Index (BMI) 40.0 and over, adult: Secondary | ICD-10-CM

## 2023-07-03 DIAGNOSIS — N1832 Chronic kidney disease, stage 3b: Secondary | ICD-10-CM

## 2023-07-03 DIAGNOSIS — Z794 Long term (current) use of insulin: Secondary | ICD-10-CM

## 2023-07-03 DIAGNOSIS — D649 Anemia, unspecified: Secondary | ICD-10-CM

## 2023-07-03 DIAGNOSIS — E861 Hypovolemia: Secondary | ICD-10-CM | POA: Diagnosis present

## 2023-07-03 DIAGNOSIS — R739 Hyperglycemia, unspecified: Secondary | ICD-10-CM | POA: Diagnosis not present

## 2023-07-03 DIAGNOSIS — J158 Pneumonia due to other specified bacteria: Secondary | ICD-10-CM | POA: Diagnosis not present

## 2023-07-03 DIAGNOSIS — D849 Immunodeficiency, unspecified: Secondary | ICD-10-CM | POA: Diagnosis present

## 2023-07-03 DIAGNOSIS — N400 Enlarged prostate without lower urinary tract symptoms: Secondary | ICD-10-CM | POA: Diagnosis present

## 2023-07-03 DIAGNOSIS — F319 Bipolar disorder, unspecified: Secondary | ICD-10-CM | POA: Diagnosis present

## 2023-07-03 DIAGNOSIS — D701 Agranulocytosis secondary to cancer chemotherapy: Secondary | ICD-10-CM | POA: Diagnosis not present

## 2023-07-03 DIAGNOSIS — A411 Sepsis due to other specified staphylococcus: Secondary | ICD-10-CM | POA: Diagnosis not present

## 2023-07-03 DIAGNOSIS — Z7401 Bed confinement status: Secondary | ICD-10-CM

## 2023-07-03 DIAGNOSIS — Z887 Allergy status to serum and vaccine status: Secondary | ICD-10-CM

## 2023-07-03 DIAGNOSIS — E7220 Disorder of urea cycle metabolism, unspecified: Secondary | ICD-10-CM | POA: Diagnosis present

## 2023-07-03 DIAGNOSIS — Z91013 Allergy to seafood: Secondary | ICD-10-CM

## 2023-07-03 DIAGNOSIS — E610 Copper deficiency: Secondary | ICD-10-CM | POA: Diagnosis not present

## 2023-07-03 DIAGNOSIS — I132 Hypertensive heart and chronic kidney disease with heart failure and with stage 5 chronic kidney disease, or end stage renal disease: Secondary | ICD-10-CM | POA: Diagnosis present

## 2023-07-03 DIAGNOSIS — F17210 Nicotine dependence, cigarettes, uncomplicated: Secondary | ICD-10-CM | POA: Diagnosis not present

## 2023-07-03 DIAGNOSIS — Z515 Encounter for palliative care: Secondary | ICD-10-CM

## 2023-07-03 DIAGNOSIS — R569 Unspecified convulsions: Secondary | ICD-10-CM | POA: Diagnosis not present

## 2023-07-03 DIAGNOSIS — I480 Paroxysmal atrial fibrillation: Secondary | ICD-10-CM | POA: Diagnosis present

## 2023-07-03 DIAGNOSIS — Z87891 Personal history of nicotine dependence: Secondary | ICD-10-CM | POA: Diagnosis not present

## 2023-07-03 DIAGNOSIS — B9689 Other specified bacterial agents as the cause of diseases classified elsewhere: Secondary | ICD-10-CM | POA: Diagnosis not present

## 2023-07-03 DIAGNOSIS — Z7951 Long term (current) use of inhaled steroids: Secondary | ICD-10-CM

## 2023-07-03 DIAGNOSIS — E1165 Type 2 diabetes mellitus with hyperglycemia: Secondary | ICD-10-CM | POA: Diagnosis not present

## 2023-07-03 DIAGNOSIS — D509 Iron deficiency anemia, unspecified: Secondary | ICD-10-CM | POA: Diagnosis present

## 2023-07-03 DIAGNOSIS — E876 Hypokalemia: Secondary | ICD-10-CM | POA: Diagnosis not present

## 2023-07-03 DIAGNOSIS — Z7901 Long term (current) use of anticoagulants: Secondary | ICD-10-CM

## 2023-07-03 DIAGNOSIS — J189 Pneumonia, unspecified organism: Secondary | ICD-10-CM | POA: Diagnosis not present

## 2023-07-03 DIAGNOSIS — J181 Lobar pneumonia, unspecified organism: Secondary | ICD-10-CM | POA: Diagnosis not present

## 2023-07-03 DIAGNOSIS — J9601 Acute respiratory failure with hypoxia: Secondary | ICD-10-CM | POA: Diagnosis not present

## 2023-07-03 DIAGNOSIS — E66811 Obesity, class 1: Secondary | ICD-10-CM | POA: Diagnosis not present

## 2023-07-03 DIAGNOSIS — L89150 Pressure ulcer of sacral region, unstageable: Secondary | ICD-10-CM | POA: Diagnosis not present

## 2023-07-03 DIAGNOSIS — G4733 Obstructive sleep apnea (adult) (pediatric): Secondary | ICD-10-CM | POA: Diagnosis present

## 2023-07-03 DIAGNOSIS — F418 Other specified anxiety disorders: Secondary | ICD-10-CM | POA: Diagnosis not present

## 2023-07-03 DIAGNOSIS — R54 Age-related physical debility: Secondary | ICD-10-CM | POA: Diagnosis present

## 2023-07-03 DIAGNOSIS — R5081 Fever presenting with conditions classified elsewhere: Secondary | ICD-10-CM | POA: Diagnosis not present

## 2023-07-03 DIAGNOSIS — Q999 Chromosomal abnormality, unspecified: Secondary | ICD-10-CM

## 2023-07-03 DIAGNOSIS — E119 Type 2 diabetes mellitus without complications: Secondary | ICD-10-CM

## 2023-07-03 DIAGNOSIS — M84422A Pathological fracture, left humerus, initial encounter for fracture: Secondary | ICD-10-CM | POA: Diagnosis not present

## 2023-07-03 DIAGNOSIS — G9341 Metabolic encephalopathy: Secondary | ICD-10-CM | POA: Diagnosis present

## 2023-07-03 DIAGNOSIS — L89322 Pressure ulcer of left buttock, stage 2: Secondary | ICD-10-CM | POA: Diagnosis not present

## 2023-07-03 DIAGNOSIS — I10 Essential (primary) hypertension: Secondary | ICD-10-CM | POA: Diagnosis not present

## 2023-07-03 DIAGNOSIS — Z885 Allergy status to narcotic agent status: Secondary | ICD-10-CM

## 2023-07-03 DIAGNOSIS — R7881 Bacteremia: Secondary | ICD-10-CM | POA: Diagnosis not present

## 2023-07-03 DIAGNOSIS — I471 Supraventricular tachycardia, unspecified: Secondary | ICD-10-CM | POA: Diagnosis not present

## 2023-07-03 DIAGNOSIS — I2510 Atherosclerotic heart disease of native coronary artery without angina pectoris: Secondary | ICD-10-CM | POA: Diagnosis present

## 2023-07-03 DIAGNOSIS — D696 Thrombocytopenia, unspecified: Secondary | ICD-10-CM | POA: Diagnosis not present

## 2023-07-03 DIAGNOSIS — S7011XA Contusion of right thigh, initial encounter: Secondary | ICD-10-CM | POA: Diagnosis not present

## 2023-07-03 DIAGNOSIS — Z7189 Other specified counseling: Secondary | ICD-10-CM | POA: Diagnosis not present

## 2023-07-03 DIAGNOSIS — R6521 Severe sepsis with septic shock: Secondary | ICD-10-CM | POA: Diagnosis not present

## 2023-07-03 DIAGNOSIS — B441 Other pulmonary aspergillosis: Secondary | ICD-10-CM

## 2023-07-03 DIAGNOSIS — N186 End stage renal disease: Secondary | ICD-10-CM | POA: Diagnosis present

## 2023-07-03 DIAGNOSIS — N183 Chronic kidney disease, stage 3 unspecified: Secondary | ICD-10-CM | POA: Insufficient documentation

## 2023-07-03 DIAGNOSIS — Z9181 History of falling: Secondary | ICD-10-CM

## 2023-07-03 DIAGNOSIS — E871 Hypo-osmolality and hyponatremia: Secondary | ICD-10-CM | POA: Diagnosis not present

## 2023-07-03 DIAGNOSIS — I130 Hypertensive heart and chronic kidney disease with heart failure and stage 1 through stage 4 chronic kidney disease, or unspecified chronic kidney disease: Secondary | ICD-10-CM | POA: Diagnosis present

## 2023-07-03 DIAGNOSIS — R531 Weakness: Secondary | ICD-10-CM | POA: Diagnosis not present

## 2023-07-03 DIAGNOSIS — N179 Acute kidney failure, unspecified: Secondary | ICD-10-CM | POA: Diagnosis not present

## 2023-07-03 DIAGNOSIS — I96 Gangrene, not elsewhere classified: Secondary | ICD-10-CM | POA: Diagnosis present

## 2023-07-03 DIAGNOSIS — S42402A Unspecified fracture of lower end of left humerus, initial encounter for closed fracture: Secondary | ICD-10-CM | POA: Diagnosis present

## 2023-07-03 DIAGNOSIS — B957 Other staphylococcus as the cause of diseases classified elsewhere: Secondary | ICD-10-CM | POA: Diagnosis not present

## 2023-07-03 DIAGNOSIS — E43 Unspecified severe protein-calorie malnutrition: Secondary | ICD-10-CM | POA: Diagnosis present

## 2023-07-03 DIAGNOSIS — Z833 Family history of diabetes mellitus: Secondary | ICD-10-CM

## 2023-07-03 DIAGNOSIS — G9340 Encephalopathy, unspecified: Secondary | ICD-10-CM

## 2023-07-03 DIAGNOSIS — E860 Dehydration: Secondary | ICD-10-CM | POA: Diagnosis present

## 2023-07-03 DIAGNOSIS — I82441 Acute embolism and thrombosis of right tibial vein: Secondary | ICD-10-CM | POA: Diagnosis not present

## 2023-07-03 DIAGNOSIS — C9000 Multiple myeloma not having achieved remission: Secondary | ICD-10-CM | POA: Diagnosis present

## 2023-07-03 DIAGNOSIS — I4891 Unspecified atrial fibrillation: Secondary | ICD-10-CM

## 2023-07-03 DIAGNOSIS — R040 Epistaxis: Secondary | ICD-10-CM | POA: Diagnosis not present

## 2023-07-03 DIAGNOSIS — Z8701 Personal history of pneumonia (recurrent): Secondary | ICD-10-CM

## 2023-07-03 DIAGNOSIS — E1122 Type 2 diabetes mellitus with diabetic chronic kidney disease: Secondary | ICD-10-CM | POA: Diagnosis present

## 2023-07-03 DIAGNOSIS — J15211 Pneumonia due to Methicillin susceptible Staphylococcus aureus: Secondary | ICD-10-CM | POA: Diagnosis not present

## 2023-07-03 DIAGNOSIS — R4182 Altered mental status, unspecified: Secondary | ICD-10-CM | POA: Diagnosis not present

## 2023-07-03 DIAGNOSIS — D630 Anemia in neoplastic disease: Secondary | ICD-10-CM | POA: Diagnosis present

## 2023-07-03 DIAGNOSIS — M7981 Nontraumatic hematoma of soft tissue: Secondary | ICD-10-CM | POA: Diagnosis not present

## 2023-07-03 DIAGNOSIS — R579 Shock, unspecified: Secondary | ICD-10-CM | POA: Diagnosis not present

## 2023-07-03 DIAGNOSIS — E8809 Other disorders of plasma-protein metabolism, not elsewhere classified: Secondary | ICD-10-CM | POA: Diagnosis present

## 2023-07-03 DIAGNOSIS — I82412 Acute embolism and thrombosis of left femoral vein: Secondary | ICD-10-CM | POA: Diagnosis not present

## 2023-07-03 DIAGNOSIS — R609 Edema, unspecified: Secondary | ICD-10-CM | POA: Diagnosis not present

## 2023-07-03 DIAGNOSIS — D61818 Other pancytopenia: Secondary | ICD-10-CM | POA: Diagnosis not present

## 2023-07-03 DIAGNOSIS — Z930 Tracheostomy status: Secondary | ICD-10-CM | POA: Diagnosis not present

## 2023-07-03 DIAGNOSIS — J45909 Unspecified asthma, uncomplicated: Secondary | ICD-10-CM | POA: Insufficient documentation

## 2023-07-03 DIAGNOSIS — Z751 Person awaiting admission to adequate facility elsewhere: Secondary | ICD-10-CM

## 2023-07-03 DIAGNOSIS — Z781 Physical restraint status: Secondary | ICD-10-CM

## 2023-07-03 DIAGNOSIS — A419 Sepsis, unspecified organism: Secondary | ICD-10-CM

## 2023-07-03 DIAGNOSIS — E8721 Acute metabolic acidosis: Secondary | ICD-10-CM | POA: Diagnosis present

## 2023-07-03 DIAGNOSIS — K219 Gastro-esophageal reflux disease without esophagitis: Secondary | ICD-10-CM | POA: Diagnosis present

## 2023-07-03 DIAGNOSIS — E11649 Type 2 diabetes mellitus with hypoglycemia without coma: Secondary | ICD-10-CM | POA: Diagnosis not present

## 2023-07-03 DIAGNOSIS — E883 Tumor lysis syndrome: Secondary | ICD-10-CM | POA: Diagnosis not present

## 2023-07-03 DIAGNOSIS — E790 Hyperuricemia without signs of inflammatory arthritis and tophaceous disease: Secondary | ICD-10-CM | POA: Diagnosis present

## 2023-07-03 DIAGNOSIS — E46 Unspecified protein-calorie malnutrition: Secondary | ICD-10-CM | POA: Diagnosis not present

## 2023-07-03 DIAGNOSIS — J168 Pneumonia due to other specified infectious organisms: Secondary | ICD-10-CM | POA: Diagnosis not present

## 2023-07-03 DIAGNOSIS — N189 Chronic kidney disease, unspecified: Secondary | ICD-10-CM | POA: Diagnosis not present

## 2023-07-03 DIAGNOSIS — N19 Unspecified kidney failure: Secondary | ICD-10-CM | POA: Diagnosis not present

## 2023-07-03 DIAGNOSIS — D631 Anemia in chronic kidney disease: Secondary | ICD-10-CM | POA: Diagnosis present

## 2023-07-03 DIAGNOSIS — E875 Hyperkalemia: Secondary | ICD-10-CM | POA: Diagnosis not present

## 2023-07-03 DIAGNOSIS — J9620 Acute and chronic respiratory failure, unspecified whether with hypoxia or hypercapnia: Secondary | ICD-10-CM | POA: Diagnosis not present

## 2023-07-03 DIAGNOSIS — J9600 Acute respiratory failure, unspecified whether with hypoxia or hypercapnia: Secondary | ICD-10-CM | POA: Diagnosis not present

## 2023-07-03 DIAGNOSIS — L02415 Cutaneous abscess of right lower limb: Secondary | ICD-10-CM | POA: Diagnosis not present

## 2023-07-03 DIAGNOSIS — E785 Hyperlipidemia, unspecified: Secondary | ICD-10-CM | POA: Diagnosis present

## 2023-07-03 DIAGNOSIS — Z888 Allergy status to other drugs, medicaments and biological substances status: Secondary | ICD-10-CM

## 2023-07-03 DIAGNOSIS — N1831 Chronic kidney disease, stage 3a: Secondary | ICD-10-CM | POA: Diagnosis not present

## 2023-07-03 DIAGNOSIS — L8990 Pressure ulcer of unspecified site, unspecified stage: Secondary | ICD-10-CM | POA: Insufficient documentation

## 2023-07-03 DIAGNOSIS — M7989 Other specified soft tissue disorders: Secondary | ICD-10-CM | POA: Diagnosis not present

## 2023-07-03 DIAGNOSIS — T451X5A Adverse effect of antineoplastic and immunosuppressive drugs, initial encounter: Secondary | ICD-10-CM | POA: Diagnosis not present

## 2023-07-03 DIAGNOSIS — R509 Fever, unspecified: Secondary | ICD-10-CM | POA: Diagnosis not present

## 2023-07-03 DIAGNOSIS — F05 Delirium due to known physiological condition: Secondary | ICD-10-CM | POA: Diagnosis not present

## 2023-07-03 DIAGNOSIS — B4489 Other forms of aspergillosis: Secondary | ICD-10-CM | POA: Diagnosis not present

## 2023-07-03 DIAGNOSIS — R34 Anuria and oliguria: Secondary | ICD-10-CM | POA: Diagnosis not present

## 2023-07-03 DIAGNOSIS — S7001XA Contusion of right hip, initial encounter: Secondary | ICD-10-CM | POA: Diagnosis not present

## 2023-07-03 DIAGNOSIS — J9503 Malfunction of tracheostomy stoma: Secondary | ICD-10-CM | POA: Diagnosis not present

## 2023-07-03 DIAGNOSIS — E440 Moderate protein-calorie malnutrition: Secondary | ICD-10-CM | POA: Diagnosis not present

## 2023-07-03 DIAGNOSIS — R042 Hemoptysis: Secondary | ICD-10-CM | POA: Diagnosis present

## 2023-07-03 DIAGNOSIS — F419 Anxiety disorder, unspecified: Secondary | ICD-10-CM | POA: Diagnosis present

## 2023-07-03 DIAGNOSIS — Z7984 Long term (current) use of oral hypoglycemic drugs: Secondary | ICD-10-CM

## 2023-07-03 DIAGNOSIS — G40909 Epilepsy, unspecified, not intractable, without status epilepticus: Secondary | ICD-10-CM | POA: Diagnosis present

## 2023-07-03 DIAGNOSIS — Z8262 Family history of osteoporosis: Secondary | ICD-10-CM

## 2023-07-03 DIAGNOSIS — R1310 Dysphagia, unspecified: Secondary | ICD-10-CM | POA: Diagnosis present

## 2023-07-03 DIAGNOSIS — Z96642 Presence of left artificial hip joint: Secondary | ICD-10-CM | POA: Diagnosis present

## 2023-07-03 DIAGNOSIS — D638 Anemia in other chronic diseases classified elsewhere: Secondary | ICD-10-CM | POA: Diagnosis not present

## 2023-07-03 DIAGNOSIS — Z96652 Presence of left artificial knee joint: Secondary | ICD-10-CM | POA: Diagnosis present

## 2023-07-03 DIAGNOSIS — Z7982 Long term (current) use of aspirin: Secondary | ICD-10-CM

## 2023-07-03 DIAGNOSIS — B9562 Methicillin resistant Staphylococcus aureus infection as the cause of diseases classified elsewhere: Secondary | ICD-10-CM | POA: Diagnosis not present

## 2023-07-03 DIAGNOSIS — E66812 Obesity, class 2: Secondary | ICD-10-CM | POA: Diagnosis present

## 2023-07-03 DIAGNOSIS — D62 Acute posthemorrhagic anemia: Secondary | ICD-10-CM | POA: Diagnosis not present

## 2023-07-03 DIAGNOSIS — G546 Phantom limb syndrome with pain: Secondary | ICD-10-CM | POA: Diagnosis not present

## 2023-07-03 LAB — CBC WITH DIFFERENTIAL/PLATELET
Abs Immature Granulocytes: 0.19 10*3/uL — ABNORMAL HIGH (ref 0.00–0.07)
Basophils Absolute: 0 10*3/uL (ref 0.0–0.1)
Basophils Relative: 0 %
Eosinophils Absolute: 0 10*3/uL (ref 0.0–0.5)
Eosinophils Relative: 0 %
HCT: 24.6 % — ABNORMAL LOW (ref 39.0–52.0)
Hemoglobin: 7.2 g/dL — ABNORMAL LOW (ref 13.0–17.0)
Immature Granulocytes: 4 %
Lymphocytes Relative: 51 %
Lymphs Abs: 2.2 10*3/uL (ref 0.7–4.0)
MCH: 27.2 pg (ref 26.0–34.0)
MCHC: 29.3 g/dL — ABNORMAL LOW (ref 30.0–36.0)
MCV: 92.8 fL (ref 80.0–100.0)
Monocytes Absolute: 0.5 10*3/uL (ref 0.1–1.0)
Monocytes Relative: 12 %
Neutro Abs: 1.4 10*3/uL — ABNORMAL LOW (ref 1.7–7.7)
Neutrophils Relative %: 33 %
Platelets: 154 10*3/uL (ref 150–400)
RBC: 2.65 MIL/uL — ABNORMAL LOW (ref 4.22–5.81)
RDW: 27.1 % — ABNORMAL HIGH (ref 11.5–15.5)
WBC: 4.3 10*3/uL (ref 4.0–10.5)
nRBC: 1.6 % — ABNORMAL HIGH (ref 0.0–0.2)

## 2023-07-03 LAB — COMPREHENSIVE METABOLIC PANEL WITH GFR
ALT: <5 U/L (ref 0–44)
AST: 14 U/L — ABNORMAL LOW (ref 15–41)
Albumin: <1.5 g/dL — ABNORMAL LOW (ref 3.5–5.0)
Alkaline Phosphatase: 31 U/L — ABNORMAL LOW (ref 38–126)
Anion gap: 3 — ABNORMAL LOW (ref 5–15)
BUN: 34 mg/dL — ABNORMAL HIGH (ref 6–20)
CO2: 20 mmol/L — ABNORMAL LOW (ref 22–32)
Calcium: 9.9 mg/dL (ref 8.9–10.3)
Chloride: 112 mmol/L — ABNORMAL HIGH (ref 98–111)
Creatinine, Ser: 4.32 mg/dL — ABNORMAL HIGH (ref 0.61–1.24)
GFR, Estimated: 15 mL/min — ABNORMAL LOW (ref >60)
Glucose, Bld: 104 mg/dL — ABNORMAL HIGH (ref 70–99)
Potassium: 3.6 mmol/L (ref 3.5–5.1)
Sodium: 135 mmol/L (ref 135–145)
Total Bilirubin: 0.5 mg/dL (ref 0.0–1.2)
Total Protein: >12 g/dL — ABNORMAL HIGH (ref 6.5–8.1)

## 2023-07-03 LAB — GLUCOSE, CAPILLARY
Glucose-Capillary: 94 mg/dL (ref 70–99)
Glucose-Capillary: 97 mg/dL (ref 70–99)
Glucose-Capillary: 91 mg/dL (ref 70–99)
Glucose-Capillary: 130 mg/dL — ABNORMAL HIGH (ref 70–99)
Glucose-Capillary: 108 mg/dL — ABNORMAL HIGH (ref 70–99)

## 2023-07-03 LAB — PHOSPHORUS: Phosphorus: 3.8 mg/dL (ref 2.5–4.6)

## 2023-07-03 LAB — HIV ANTIBODY (ROUTINE TESTING W REFLEX): HIV Screen 4th Generation wRfx: NONREACTIVE

## 2023-07-03 LAB — MAGNESIUM: Magnesium: 1.8 mg/dL (ref 1.7–2.4)

## 2023-07-03 LAB — LACTATE DEHYDROGENASE: LDH: 125 U/L (ref 98–192)

## 2023-07-03 LAB — PROCALCITONIN: Procalcitonin: 0.17 ng/mL

## 2023-07-03 MED ORDER — LORAZEPAM 2 MG/ML IJ SOLN: 1.0000 mg | Freq: Once | INTRAMUSCULAR | Status: DC

## 2023-07-03 MED ORDER — ORAL CARE MOUTH RINSE: 15.0000 mL | OROMUCOSAL | Status: DC | PRN

## 2023-07-03 MED ORDER — LORAZEPAM 2 MG/ML IJ SOLN
2.0000 mg | Freq: Four times a day (QID) | INTRAMUSCULAR | Status: DC | PRN
  Administered 2023-07-13 – 2023-07-16 (×2): 2 mg via INTRAVENOUS
  Filled 2023-07-03 (×3): qty 1

## 2023-07-03 MED ORDER — MELATONIN 5 MG PO TABS
5.0000 mg | ORAL_TABLET | Freq: Every evening | ORAL | Status: DC | PRN
  Administered 2023-07-03: 5 mg via ORAL
  Filled 2023-07-03: qty 1

## 2023-07-03 MED ORDER — HALOPERIDOL LACTATE 5 MG/ML IJ SOLN
1.0000 mg | INTRAMUSCULAR | Status: DC | PRN
Start: 2023-07-03 — End: 2023-07-04
  Administered 2023-07-03 – 2023-07-04 (×2): 2 mg via INTRAVENOUS
  Filled 2023-07-03 (×2): qty 1

## 2023-07-03 MED ORDER — ZOLEDRONIC ACID 4 MG/100ML IV SOLN
4.0000 mg | Freq: Once | INTRAVENOUS | Status: AC
  Administered 2023-07-03: 4 mg via INTRAVENOUS
  Filled 2023-07-03: qty 100

## 2023-07-03 MED ORDER — OXYCODONE HCL 5 MG PO TABS: 5.0000 mg | ORAL_TABLET | Freq: Four times a day (QID) | ORAL | Status: DC | PRN

## 2023-07-03 MED ORDER — INSULIN ASPART 100 UNIT/ML IJ SOLN: 0.0000 [IU] | Freq: Every day | INTRAMUSCULAR | Status: DC

## 2023-07-03 MED ORDER — LORAZEPAM 2 MG/ML IJ SOLN
1.0000 mg | Freq: Once | INTRAMUSCULAR | Status: AC
Start: 2023-07-03 — End: 2023-07-03
  Administered 2023-07-03: 1 mg via INTRAMUSCULAR
  Filled 2023-07-03: qty 1

## 2023-07-03 MED ORDER — DENOSUMAB 120 MG/1.7ML ~~LOC~~ SOLN: 120.0000 mg | Freq: Once | SUBCUTANEOUS | Status: DC

## 2023-07-03 MED ORDER — LACTATED RINGERS IV SOLN: INTRAVENOUS | Status: DC

## 2023-07-03 MED ORDER — PROCHLORPERAZINE EDISYLATE 10 MG/2ML IJ SOLN: 5.0000 mg | Freq: Four times a day (QID) | INTRAMUSCULAR | Status: DC | PRN

## 2023-07-03 MED ORDER — INSULIN ASPART 100 UNIT/ML IJ SOLN
0.0000 [IU] | Freq: Three times a day (TID) | INTRAMUSCULAR | Status: DC
Start: 2023-07-03 — End: 2023-07-07
  Administered 2023-07-04 – 2023-07-05 (×3): 2 [IU] via SUBCUTANEOUS
  Administered 2023-07-06: 3 [IU] via SUBCUTANEOUS
  Administered 2023-07-06 – 2023-07-07 (×4): 2 [IU] via SUBCUTANEOUS

## 2023-07-03 MED ORDER — POLYETHYLENE GLYCOL 3350 17 G PO PACK: 17.0000 g | PACK | Freq: Every day | ORAL | Status: DC | PRN

## 2023-07-03 MED ORDER — LORAZEPAM 2 MG/ML IJ SOLN
1.0000 mg | INTRAMUSCULAR | Status: AC
  Administered 2023-07-03: 1 mg via INTRAVENOUS
  Filled 2023-07-03: qty 1

## 2023-07-03 MED ORDER — ACETAMINOPHEN 325 MG PO TABS: 650.0000 mg | ORAL_TABLET | Freq: Four times a day (QID) | ORAL | Status: DC | PRN

## 2023-07-03 MED ORDER — FUROSEMIDE 10 MG/ML IJ SOLN
40.0000 mg | Freq: Once | INTRAMUSCULAR | Status: AC
  Administered 2023-07-03: 40 mg via INTRAVENOUS
  Filled 2023-07-03: qty 4

## 2023-07-03 MED ORDER — HEPARIN SODIUM (PORCINE) 5000 UNIT/ML IJ SOLN: 5000.0000 [IU] | Freq: Three times a day (TID) | INTRAMUSCULAR | Status: DC

## 2023-07-03 MED ORDER — AZITHROMYCIN 250 MG PO TABS
250.0000 mg | ORAL_TABLET | Freq: Every day | ORAL | Status: DC
  Administered 2023-07-03: 250 mg via ORAL
  Filled 2023-07-03 (×3): qty 1

## 2023-07-03 MED ORDER — SODIUM CHLORIDE 0.9 % IV SOLN
1.0000 g | INTRAVENOUS | Status: DC
  Administered 2023-07-03 – 2023-07-08 (×6): 1 g via INTRAVENOUS
  Filled 2023-07-03 (×7): qty 10

## 2023-07-03 NOTE — Consult Note (Signed)
 Chief Complaint: Lytic bone lesions, concern for multiple myeloma, anemia, encephalopathy  Referring Provider(s): Ennever,P  Supervising Physician: Roanna Banning  Patient Status: [facility] - In-pt  History of Present Illness: Frank Azer. is a 58 y.o. male smoker  with PMH sig for anxiety, arthritis, asthma, bipolar disorder, depression,DM, renal stones, HLD,HTN, obesity, seizures, sleep apnea who was recently transferred from [facility], [facility] for further workup of suspected myeloma.  Patient has  had recent history of progressive weakness, confusion, anemia, elevated creatinine and abnormal protein levels along with lytic bony lesions.  He is being treated for pneumonia as well. Request now received from oncology for bone marrow biopsy for further evaluation.    Patient is Full Code  Past Medical History:  Diagnosis Date   Anxiety    Arthritis    Asthma    Bipolar disorder (HCC)    Current every day smoker    Depression    Diabetes mellitus, type II (HCC)    Dyspnea    History of kidney stones    History of pneumonia    Hyperlipidemia    Hypertension    Morbid obesity (HCC)    Sedentary lifestyle    Seizures (HCC)    last seizure 12 yrs ago, no current problem   Sleep apnea    uses CPAP nightly    Past Surgical History:  Procedure Laterality Date   CARDIAC CATHETERIZATION  2020   carpal tunel Left    COLONOSCOPY     HARDWARE REMOVAL Left 07/26/2022   Procedure: HARDWARE REMOVAL ELBOW;  Surgeon: Roby Lofts, MD;  Location: [facility] OR;  Service: Orthopedics;  Laterality: Left;   ORIF HUMERUS FRACTURE Left 01/16/2022   Procedure: OPEN REDUCTION INTERNAL FIXATION (ORIF) DISTAL HUMERUS FRACTURE;  Surgeon: Roby Lofts, MD;  Location: [facility] OR;  Service: Orthopedics;  Laterality: Left;   ORIF HUMERUS FRACTURE Left 01/29/2023   Procedure: OPEN REDUCTION INTERNAL FIXATION (ORIF) DISTAL HUMERUS FRACTURE;  Surgeon: Roby Lofts, MD;  Location: [facility] OR;  Service:  Orthopedics;  Laterality: Left;   OTHER SURGICAL HISTORY     R & L shoulder   OTHER SURGICAL HISTORY Right    Knee surgery x several   TOTAL HIP ARTHROPLASTY Left 12/26/2017   Procedure: LEFT TOTAL HIP ARTHROPLASTY ANTERIOR APPROACH;  Surgeon: Kathryne Hitch, MD;  Location: [facility] ORS;  Service: Orthopedics;  Laterality: Left;   TOTAL KNEE ARTHROPLASTY Left 11/23/2021   Procedure: LEFT TOTAL KNEE ARTHROPLASTY;  Surgeon: Kathryne Hitch, MD;  Location: [facility] ORS;  Service: Orthopedics;  Laterality: Left;    Allergies: Morphine and codeine, Shellfish allergy, Betadine [povidone iodine], Chlorhexidine, Influenza vaccines, and Metoprolol  Medications: Prior to Admission medications   Medication Sig Start Date End Date Taking? Authorizing Provider  albuterol (PROVENTIL HFA;VENTOLIN HFA) 108 (90 Base) MCG/ACT inhaler Inhale 2 puffs into the lungs every 6 (six) hours as needed for wheezing or shortness of breath.    [provider]  albuterol (PROVENTIL) (2.5 MG/3ML) 0.083% nebulizer solution 2.5 mg every 2 (two) hours as needed for wheezing or shortness of breath.    [provider]  amLODipine (NORVASC) 10 MG tablet Take 10 mg by mouth at bedtime.    [provider]  aspirin EC 81 MG tablet Take 81 mg by mouth 2 (two) times a week. Swallow whole.    [provider]  atorvastatin (LIPITOR) 40 MG tablet Take 40 mg by mouth daily.    [provider]  buPROPion (WELLBUTRIN XL) 150 MG 24 hr tablet Take 150 mg by mouth daily.    [provider]  busPIRone (BUSPAR) 15 MG tablet Take 15 mg by mouth 2 (two) times daily.    [provider]  celecoxib (CELEBREX) 200 MG capsule Take 200 mg by mouth daily. Additional 200 mg if needed a bedtime    [provider]  clonazePAM (KLONOPIN) 1 MG tablet Take 1 mg by mouth once as needed for anxiety.    [provider]  cyclobenzaprine (FLEXERIL) 5 MG tablet Take 1 tablet (5  mg total) by mouth 3 (three) times daily as needed for muscle spasms. 01/30/23   Versie Gores, PA-C  divalproex (DEPAKOTE) 500 MG DR tablet Take 500 mg by mouth 2 (two) times daily.    [provider]  EPINEPHrine 0.3 mg/0.3 mL IJ SOAJ injection Inject 0.3 mg into the muscle as needed for anaphylaxis. 08/28/21   [provider]  esomeprazole (NEXIUM) 40 MG capsule Take 40 mg by mouth in the morning. Additional 40 mg if needed at bedtime    [provider]  Fluticasone-Salmeterol (ADVAIR) 500-50 MCG/DOSE AEPB Inhale 1 puff into the lungs in the morning.    [provider]  furosemide (LASIX) 40 MG tablet Take 40 mg by mouth daily as needed for fluid or edema.    [provider]  gabapentin (NEURONTIN) 300 MG capsule Take 300-600 mg by mouth See admin instructions. 300 mg in the morning, 600 mg at bedtime    [provider]  hydrALAZINE (APRESOLINE) 25 MG tablet Take 25 mg by mouth 3 (three) times daily. 12/10/22   [provider]  hydrochlorothiazide (HYDRODIURIL) 25 MG tablet Take 25 mg by mouth daily.    [provider]  HYDROcodone-acetaminophen (NORCO/VICODIN) 5-325 MG tablet Take 1 tablet by mouth every 8 (eight) hours as needed for severe pain (pain score 7-10) or moderate pain (pain score 4-6).    [provider]  hydrOXYzine (ATARAX) 25 MG tablet Take 25 mg by mouth at bedtime.    [provider]  lisinopril (ZESTRIL) 40 MG tablet Take 40 mg by mouth daily. 03/25/23   [provider]  metFORMIN (GLUCOPHAGE) 1000 MG tablet Take 1,000 mg by mouth 2 (two) times daily with a meal.    [provider]  montelukast (SINGULAIR) 10 MG tablet Take 10 mg by mouth at bedtime.    [provider]  oxyCODONE (ROXICODONE) 5 MG immediate release tablet Take 1 tablet (5 mg total) by mouth every 4 (four) hours as needed for severe pain (pain score 7-10). 01/30/23   Versie Gores, PA-C   PARoxetine (PAXIL) 40 MG tablet Take 40 mg by mouth every morning.    [provider]  sertraline (ZOLOFT) 25 MG tablet Take 25 mg by mouth daily.    [provider]  spironolactone (ALDACTONE) 25 MG tablet Take 25 mg by mouth at bedtime. 10/07/21   [provider]  tamsulosin (FLOMAX) 0.4 MG CAPS capsule Take 0.4 mg by mouth at bedtime. 10/07/21   [provider]  testosterone cypionate (DEPOTESTOSTERONE CYPIONATE) 200 MG/ML injection Inject 0.5 mLs (100 mg total) into the muscle every 14 (fourteen) days. 09/17/16   Nida, Gebreselassie W, MD  verapamil (CALAN-SR) 120 MG CR tablet Take 120 mg by mouth daily.    [provider]  Vitamin D, Ergocalciferol, (DRISDOL) 1.25 MG (50000 UNIT) CAPS capsule Take 1 capsule (50,000 Units total) by mouth every Thursday.  02/06/23   Versie Gores, PA-C     Family History  Problem Relation Age of Onset   Diabetes Mother    Hypertension Mother    Heart attack Mother    Osteoporosis Mother    Diabetes Father    Hypertension Father    Heart attack Father    Diabetes Sister    Hypertension Sister    Diabetes Brother    Hypertension Brother     Social History   Socioeconomic History   Marital status: Married    Spouse name: Not on file   Number of children: Not on file   Years of education: Not on file   Highest education level: Not on file  Occupational History   Not on file  Tobacco Use   Smoking status: Every Day    Current packs/day: 0.50    Average packs/day: 0.5 packs/day for 25.0 years (12.5 ttl pk-yrs)    Types: Cigarettes   Smokeless tobacco: Never  Vaping Use   Vaping status: Never Used  Substance and Sexual Activity   Alcohol use: No   Drug use: Yes    Types: Marijuana    Comment: 01/28/2023   Sexual activity: Yes  Other Topics Concern   Not on file  Social History Narrative   Not on file   Social Drivers of Health   Financial Resource Strain: Not on file  Food Insecurity: No  Food Insecurity (07/03/2023)   Hunger Vital Sign    Worried About Running Out of Food in the Last Year: Never true    Ran Out of Food in the Last Year: Never true  Transportation Needs: No Transportation Needs (07/03/2023)   PRAPARE - Administrator, Civil Service (Medical): No    Lack of Transportation (Non-Medical): No  Physical Activity: Not on file  Stress: Not on file  Social Connections: Not on file       Review of Systems : see above; difficult to obtain secondary to pt's lethargic state; no reports of fever, HA, N/V or bleeding  Vital Signs: BP (!) 157/101 (BP Location: Right Arm)   Pulse 98   Temp (!) 97.5 F (36.4 C) (Oral)   Resp 19   Ht 6' (1.829 m)   Wt 243 lb 14.4 oz (110.6 kg)   SpO2 95%   BMI 33.08 kg/m   Advance Care Plan: no documents on file    Physical Exam: pt drowsy, lethargic but will open eyes, mumbles responses to questions, restraints in place due to recent agitation; chest- CTA bilat; heart- RRR; abd-soft,+BS,NT; no LE edema; LUE brace in place  Imaging: XR Lumbar Spine Complete Result Date: 06/05/2023 XRs of the lumbar spine from 06/05/2023 were independently reviewed and interpreted, showing grade 1 spondylolisthesis at L5/S1. Disc height loss at L5/S1. No other significant degenerative changes seen. No lesions seen within the vertebral bodies. No fracture or dislocation seen.    Labs:  CBC: Recent Labs    01/29/23 0659 01/29/23 1044 01/29/23 1246 01/30/23 0946 07/03/23 0450  WBC 6.3  --  6.9 8.4 4.3  HGB 9.7* 10.2* 8.8* 7.5* 7.2*  HCT 31.9* 30.0* 29.2* 24.7* 24.6*  PLT 299  --  316 286 154    COAGS: No results for input(s): "INR", "APTT" in the last 8760 hours.  BMP: Recent Labs    01/29/23 0659 01/29/23 1044 01/29/23 1246 01/30/23 0946 07/03/23 0505  NA 135 139 135 131* 135  K 4.1 4.3 4.1 3.6 3.6  CL  104  --  104 101 112*  CO2 25  --  25 26 20*  GLUCOSE 115*  --  163* 173* 104*  BUN 12  --  13 13 34*   CALCIUM 8.4*  --  8.3* 8.2* 9.9  CREATININE 1.38*  --  1.19 1.52* 4.32*  GFRNONAA 59*  --  >60 53* 15*    LIVER FUNCTION TESTS: Recent Labs    01/29/23 1246 07/03/23 0505  BILITOT 0.6 0.5  AST 10* 14*  ALT 9 <5  ALKPHOS 34* 31*  PROT 7.3 >12.0*  ALBUMIN 2.5* <1.5*    TUMOR MARKERS: No results for input(s): "AFPTM", "CEA", "CA199", "CHROMGRNA" in the last 8760 hours.  Assessment and Plan: 59 y.o. male smoker  with PMH sig for anxiety, arthritis, asthma, bipolar disorder, depression,DM, renal stones, HLD,HTN, obesity, seizures, sleep apnea who was recently transferred from [facility], [facility] for further workup of suspected myeloma.  Patient has  had recent history of progressive weakness, confusion, anemia, elevated creatinine and abnormal protein levels along with lytic bony lesions.  He is being treated for pneumonia as well. Request now received from oncology for bone marrow biopsy for further evaluation.Risks and benefits of procedure was discussed with the patient's spouse Frank [name] including, but not limited to bleeding, infection, damage to adjacent structures or low yield requiring additional tests.  All of the questions were answered and there is agreement to proceed.  Consent signed and in chart. Procedure tent planned for 4/18 am    Thank you for allowing our service to participate in Frank [name]. 's care.  Electronically Signed: D. Honore Lux, PA-C   07/03/2023, 2:01 PM      I spent a total of 20 Minutes    in face to face in clinical consultation, greater than 50% of which was counseling/coordinating care for CT guided bone marrow biopsy

## 2023-07-03 NOTE — Plan of Care (Signed)
  Problem: Clinical Measurements: Goal: Respiratory complications will improve Outcome: Progressing Goal: Cardiovascular complication will be avoided Outcome: Progressing   Problem: Elimination: Goal: Will not experience complications related to bowel motility Outcome: Progressing Goal: Will not experience complications related to urinary retention Outcome: Progressing   Problem: Pain Managment: Goal: General experience of comfort will improve and/or be controlled Outcome: Progressing

## 2023-07-03 NOTE — Progress Notes (Signed)
   07/03/23 2029  BiPAP/CPAP/SIPAP  BiPAP/CPAP/SIPAP Pt Type Adult  Reason BIPAP/CPAP not in use Other(comment) (Pt has mitts on at this time holding off on cpap)

## 2023-07-03 NOTE — H&P (Addendum)
 History and Physical  Frank [name]. ZOX:[id number] DOB: 02/19/1966 DOA: 07/03/2023  Referring physician: Accepted by Dr. Benjamine Mola [facility], hospitalist service. PCP: Arlina Robes, MD  Outpatient Specialists: Orthopedic surgery, cardiology. Patient coming from: Direct transfer from [facility], [place].  Chief Complaint: Concern for multiple myeloma.  HPI: Frank [name]. is a 58 y.o. 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  He was admitted on 06/29/2023 through 07/02/2023.  The patient was admitted for sepsis secondary to community-acquired pneumonia, acute metabolic encephalopathy secondary to sepsis, AKI, hypercalcemia, symptomatic anemia requiring 3 unit PRBCs, and lytic bone lesions with concern for multiple myeloma.    The patient was seen by medical oncology as [facility].  He was in the process of a workup for multiple myeloma.  His family requested transfer to [facility] for continuity of care since most of his providers are from [facility].  He had presented at [facility] with 1 week of generalized weakness.  Associated with hemoglobin of 6.1 (received 3 unit PRBCs which improved to 8.5).  Creatinine on admission 5.84 improved to 5.33.  IgG 13428, IgA 85, IgM less than 8, free kappa 158, free lambda 11.6, free kappa lambda ratio 13.50, M spike pending.  The patient was accepted by Dr. Benjamine Mola, [facility], hospitalist service.  Medical oncology Dr. Myna Hidalgo will see the patient in consultation.  The patient was seen and examined at his bedside at [facility].  He is alert and his mentation appears to be improving and he is able to answer questions appropriately.  Does not voice any concerns at this time.  ED  Course: Direct transfer from [facility].  Review of Systems: Review of systems as noted in the HPI. All other systems reviewed and are negative.   Past Medical History:  Diagnosis Date   Anxiety    Arthritis    Asthma    Bipolar disorder (HCC)    Current every day smoker    Depression    Diabetes mellitus, type II (HCC)    Dyspnea    History of kidney stones    History of pneumonia    Hyperlipidemia    Hypertension    Morbid obesity (HCC)    Sedentary lifestyle    Seizures (HCC)    last seizure 12 yrs ago, no current problem   Sleep apnea    uses CPAP nightly   Past Surgical History:  Procedure Laterality Date   CARDIAC CATHETERIZATION  2020   carpal tunel Left    COLONOSCOPY     HARDWARE REMOVAL Left 07/26/2022   Procedure: HARDWARE REMOVAL ELBOW;  Surgeon: Roby Lofts, MD;  Location: [facility] OR;  Service: Orthopedics;  Laterality: Left;   ORIF HUMERUS FRACTURE Left 01/16/2022   Procedure: OPEN REDUCTION INTERNAL FIXATION (ORIF) DISTAL HUMERUS FRACTURE;  Surgeon: Roby Lofts, MD;  Location: [facility] OR;  Service: Orthopedics;  Laterality: Left;   ORIF HUMERUS FRACTURE Left 01/29/2023   Procedure: OPEN REDUCTION INTERNAL FIXATION (ORIF) DISTAL HUMERUS FRACTURE;  Surgeon: Roby Lofts, MD;  Location: [facility] OR;  Service: Orthopedics;  Laterality: Left;   OTHER SURGICAL HISTORY     R & L shoulder   OTHER SURGICAL HISTORY Right    Knee surgery x several   TOTAL HIP ARTHROPLASTY  Left 12/26/2017   Procedure: LEFT TOTAL HIP ARTHROPLASTY ANTERIOR APPROACH;  Surgeon: Kathryne Hitch, MD;  Location: [facility] ORS;  Service: Orthopedics;  Laterality: Left;   TOTAL KNEE ARTHROPLASTY Left 11/23/2021   Procedure: LEFT TOTAL KNEE ARTHROPLASTY;  Surgeon: Kathryne Hitch, MD;  Location: [facility] ORS;  Service: Orthopedics;  Laterality: Left;    Social History:  reports that he has been smoking cigarettes. He has a 12.5 pack-year smoking history. He has never used smokeless tobacco.  He reports current drug use. Drug: Marijuana. He reports that he does not drink alcohol.   Allergies  Allergen Reactions   Morphine And Codeine Anaphylaxis   Shellfish Allergy Anaphylaxis   Betadine [Povidone Iodine] Itching   Chlorhexidine Hives    Patient reports never had issues CHG with mouth rinse.   Influenza Vaccines Hives   Metoprolol Rash    Family History  Problem Relation Age of Onset   Diabetes Mother    Hypertension Mother    Heart attack Mother    Osteoporosis Mother    Diabetes Father    Hypertension Father    Heart attack Father    Diabetes Sister    Hypertension Sister    Diabetes Brother    Hypertension Brother       Prior to Admission medications   Medication Sig Start Date End Date Taking? Authorizing Provider  acetaminophen (TYLENOL) 325 MG tablet Take 2 tablets (650 mg total) by mouth every 6 (six) hours as needed. 01/24/23   Sloan Leiter, DO  albuterol (PROVENTIL HFA;VENTOLIN HFA) 108 (90 Base) MCG/ACT inhaler Inhale 2 puffs into the lungs every 6 (six) hours as needed for wheezing or shortness of breath.    [provider]  albuterol (PROVENTIL) (2.5 MG/3ML) 0.083% nebulizer solution 2.5 mg every 2 (two) hours as needed for wheezing or shortness of breath.    [provider]  amLODipine (NORVASC) 10 MG tablet Take 10 mg by mouth at bedtime.    [provider]  aspirin EC 81 MG tablet Take 81 mg by mouth 2 (two) times a week. Swallow whole.    [provider]  atorvastatin (LIPITOR) 40 MG tablet Take 40 mg by mouth daily.    [provider]  busPIRone (BUSPAR) 15 MG tablet Take 15 mg by mouth 2 (two) times daily.    [provider]  celecoxib (CELEBREX) 200 MG capsule Take 200 mg by mouth daily. Additional 200 mg if needed a bedtime    [provider]  clonazePAM (KLONOPIN) 1 MG tablet Take 1 mg by mouth once as needed for anxiety.    [provider]  cyclobenzaprine (FLEXERIL) 5  MG tablet Take 1 tablet (5 mg total) by mouth 3 (three) times daily as needed for muscle spasms. 01/30/23   West Bali, PA-C  divalproex (DEPAKOTE) 500 MG DR tablet Take 500 mg by mouth 2 (two) times daily.    [provider]  EPINEPHrine 0.3 mg/0.3 mL IJ SOAJ injection Inject 0.3 mg into the muscle as needed for anaphylaxis. 08/28/21   [provider]  esomeprazole (NEXIUM) 40 MG capsule Take 40 mg by mouth in the morning. Additional 40 mg if needed at bedtime    [provider]  Fluticasone-Salmeterol (ADVAIR) 500-50 MCG/DOSE AEPB Inhale 1 puff into the lungs in the morning.    [provider]  furosemide (LASIX) 40 MG tablet Take 40 mg by mouth daily as needed for fluid or edema.    [provider]  gabapentin (NEURONTIN) 300 MG capsule Take 300-600 mg by mouth See admin instructions. 300 mg in the morning, 600 mg at bedtime    [provider]  hydrALAZINE (APRESOLINE) 10 MG tablet Take 10 mg by mouth 3 (three) times daily.    [provider]  hydrochlorothiazide (HYDRODIURIL) 25 MG tablet Take 25 mg by mouth daily.    [provider]  hydrOXYzine (ATARAX) 25 MG tablet Take 25 mg by mouth at bedtime.    [provider]  lisinopril (ZESTRIL) 20 MG tablet Take 20 mg by mouth daily.    [provider]  metFORMIN (GLUCOPHAGE) 1000 MG tablet Take 1,000 mg by mouth 2 (two) times daily with a meal.    [provider]  methylPREDNISolone (MEDROL DOSEPAK) 4 MG TBPK tablet Take as prescribed on the box 06/05/23   Diedra Fowler, MD  montelukast (SINGULAIR) 10 MG tablet Take 10 mg by mouth at bedtime.    [provider]  OVER THE COUNTER MEDICATION Take 1 capsule by mouth at bedtime. Burdock Root Supplement    [provider]  oxyCODONE (ROXICODONE) 5 MG immediate release tablet Take 1 tablet (5 mg total) by mouth every 4 (four) hours as needed for severe pain (pain score 7-10). 01/30/23    Versie Gores, PA-C  PARoxetine (PAXIL) 40 MG tablet Take 40 mg by mouth every morning.    [provider]  sertraline (ZOLOFT) 25 MG tablet Take 25 mg by mouth daily.    [provider]  spironolactone (ALDACTONE) 25 MG tablet Take 25 mg by mouth at bedtime. 10/07/21   [provider]  tamsulosin (FLOMAX) 0.4 MG CAPS capsule Take 0.4 mg by mouth at bedtime. 10/07/21   [provider]  testosterone cypionate (DEPOTESTOSTERONE CYPIONATE) 200 MG/ML injection Inject 0.5 mLs (100 mg total) into the muscle every 14 (fourteen) days. 09/17/16   Nida, Gebreselassie W, MD  verapamil (CALAN-SR) 120 MG CR tablet Take 120 mg by mouth daily.    [provider]  Vitamin D, Ergocalciferol, (DRISDOL) 1.25 MG (50000 UNIT) CAPS capsule Take 1 capsule (50,000 Units total) by mouth every Thursday. 02/06/23   Versie Gores, PA-C    Physical Exam: BP (!) 151/97 (BP Location: Right Arm)   Pulse 85   Temp 97.7 F (36.5 C) (Oral)   Resp 20   SpO2 95%   General: 58 y.o. year-old male well developed well nourished in no acute distress.  Alert and oriented x3. Cardiovascular: Regular rate and rhythm with no rubs or gallops.  No thyromegaly or JVD noted.  No lower extremity edema. 2/4 pulses in all 4 extremities. Respiratory: Clear to auscultation with no wheezes or rales. Good inspiratory effort. Abdomen: Soft nontender nondistended with normal bowel sounds x4 quadrants. Muskuloskeletal: No cyanosis, clubbing or edema noted bilaterally.  Left upper extremity in an immobilizer. Neuro: CN II-XII intact, strength, sensation, reflexes Skin: No ulcerative lesions noted or rashes Psychiatry: Judgement and insight appear normal. Mood is appropriate for condition and setting          Labs on Admission:  Basic Metabolic Panel: No results for input(s): "NA", "K", "CL", "CO2", "GLUCOSE", "BUN", "CREATININE", "CALCIUM", "MG", "PHOS" in the last 168 hours. Liver Function  Tests: No results for input(s): "AST", "ALT", "ALKPHOS", "BILITOT", "PROT", "ALBUMIN" in the last 168 hours. No results for input(s): "LIPASE", "AMYLASE" in the last 168 hours. No results for input(s): "AMMONIA" in the last 168 hours. CBC: No results  for input(s): "WBC", "NEUTROABS", "HGB", "HCT", "MCV", "PLT" in the last 168 hours. Cardiac Enzymes: No results for input(s): "CKTOTAL", "CKMB", "CKMBINDEX", "TROPONINI" in the last 168 hours.  BNP (last 3 results) No results for input(s): "BNP" in the last 8760 hours.  ProBNP (last 3 results) No results for input(s): "PROBNP" in the last 8760 hours.  CBG: No results for input(s): "GLUCAP" in the last 168 hours.  Radiological Exams on Admission: No results found.  EKG: I independently viewed the EKG done and my findings are as followed: Sinus rhythm rate of 66.  Assessment/Plan Present on Admission: **None**  Principal Problem:   Generalized weakness  Generalized weakness suspect multifactorial secondary to hypercalcemia, suspected malignancy, multiple myeloma. Continue to address underlying issues Gentle IV fluid hydration Fall precautions.  Hypercalcemia, lytic bone lesions, AKI, anemia with concern for multiple myeloma  Dr. Maria Shiner, medical oncology will see in consultation Obtain CMP Continue IV fluid hydration The patient received calcitonin at outside facility.  AKI, suspect prerenal in the setting of dehydration from hypercalcemia Presented to outside facility with creatinine of 5.84 Creatinine improved to 5.3. Continue IV fluid hydration Avoid nephrotoxic agents, dehydration and hypotension Monitor urine output Follow CMP  Recently diagnosed and treated for community-acquired pneumonia Obtain procalcitonin level Received at least 3 days of IV antibiotics Start Rocephin and p.o. azithromycin to complete course  Acute metabolic encephalopathy, improving Continue to treat underlying conditions Reorient as  needed Fall precautions.  OSA Resume home CPAP  Seizure disorder Resume home AED once home meds are reconciled Seizure precautions  Type 2 diabetes with hyperglycemia Obtain hemoglobin A1c Start insulin sliding scale.  Obesity BMI 33  Anemia of chronic disease Presented at outside facility with hemoglobin of 6.1 Received 3 unit PRBCs, repeat hemoglobin 8.5. IgG 13428, IgA 85, IgM less than 8, free kappa 158, free lambda 11.6, free kappa lambda ratio 13.50, M spike pending.  Tobacco use disorder Tobacco cessation counseling Recommend complete cessation of tobacco use.    Critical care time: 65 minutes.   DVT prophylaxis: SCDs  Code Status: Full code  Family Communication: None at bedside.  Disposition Plan: Admitted to telemetry unit  Consults called: Medical oncology, Dr. Maria Shiner.  Admission status: Inpatient status.   Status is: Inpatient The patient requires at least 2 midnights for further evaluation and treatment of present condition.   Bary Boss MD [facility] Pager [phone]  If 7PM-7AM, please contact night-coverage [email or web address] Password [facility]  07/03/2023, 12:46 AM

## 2023-07-03 NOTE — Consult Note (Signed)
 [facility]  HISTORY AND PHYSICAL  Frank [name]. is an 58 y.o. male.    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  Initially presented with sepsis 2/2 CAP and was found to have AKI, anemia requiring 3 u pRBCs, hypercalcemia, and lytic bone lesions.  Creatinine on admission 5.84 improved to 5.33. IgG 13428, IgA 85, IgM less than 8, free kappa 158, free lambda 11.6, free kappa lambda ratio 13.50, M spike pending.  Was seen by oncology earlier today.  Was found to be confused and there was some concern re: hypercalcemia causing the confusion.  He is on IVFs and Zometa has been ordered and is being hung now.  He is not getting calcitonin d/t concerns over shellfish allergy.    Seen and examined at bedside.  He is confused and has mitts on.  Can intermittently answer questions.    Cr 4.32, Ca 9.9, albumin < 1.5, total protein > 12.  Hgb 7.2 LDH WNL.  He is scheduled for BM Bx tomorrow.  In this setting we are asked to see.     PMH: Past Medical History:  Diagnosis Date   Anxiety    Arthritis    Asthma    Bipolar disorder (HCC)    Current every day smoker    Depression    Diabetes mellitus, type II (HCC)    Dyspnea    History of kidney stones    History of pneumonia    Hyperlipidemia    Hypertension    Morbid obesity (HCC)    Sedentary lifestyle    Seizures (HCC)    last seizure 12 yrs ago, no current problem   Sleep apnea    uses CPAP nightly   PSH: Past Surgical History:  Procedure Laterality Date   CARDIAC CATHETERIZATION  2020   carpal tunel Left    COLONOSCOPY     HARDWARE REMOVAL Left 07/26/2022   Procedure: HARDWARE REMOVAL ELBOW;  Surgeon: Laneta Pintos, MD;   Location: [facility] OR;  Service: Orthopedics;  Laterality: Left;   ORIF HUMERUS FRACTURE Left 01/16/2022   Procedure: OPEN REDUCTION INTERNAL FIXATION (ORIF) DISTAL HUMERUS FRACTURE;  Surgeon: Laneta Pintos, MD;  Location: [facility] OR;  Service: Orthopedics;  Laterality: Left;   ORIF HUMERUS FRACTURE Left 01/29/2023   Procedure: OPEN REDUCTION INTERNAL FIXATION (ORIF) DISTAL HUMERUS FRACTURE;  Surgeon: Laneta Pintos, MD;  Location: [facility] OR;  Service: Orthopedics;  Laterality: Left;   OTHER SURGICAL HISTORY     R & L shoulder   OTHER SURGICAL HISTORY Right    Knee surgery x several   TOTAL HIP ARTHROPLASTY Left 12/26/2017   Procedure: LEFT TOTAL HIP ARTHROPLASTY ANTERIOR APPROACH;  Surgeon: Arnie Lao, MD;  Location: [facility];  Service: Orthopedics;  Laterality: Left;   TOTAL KNEE ARTHROPLASTY Left 11/23/2021   Procedure: LEFT TOTAL KNEE ARTHROPLASTY;  Surgeon: Arnie Lao, MD;  Location: [facility];  Service: Orthopedics;  Laterality: Left;     Past Medical History:  Diagnosis Date   Anxiety    Arthritis    Asthma    Bipolar disorder (HCC)    Current every day smoker    Depression    Diabetes mellitus,  type II (HCC)    Dyspnea    History of kidney stones    History of pneumonia    Hyperlipidemia    Hypertension    Morbid obesity (HCC)    Sedentary lifestyle    Seizures (HCC)    last seizure 12 yrs ago, no current problem   Sleep apnea    uses CPAP nightly    Medications:  Scheduled:  azithromycin  250 mg Oral Daily   furosemide  40 mg Intravenous Once   insulin aspart  0-5 Units Subcutaneous QHS   insulin aspart  0-9 Units Subcutaneous TID WC    Medications Prior to Admission  Medication Sig Dispense Refill   albuterol (PROVENTIL HFA;VENTOLIN HFA) 108 (90 Base) MCG/ACT inhaler Inhale 2 puffs into the lungs every 6 (six) hours as needed for wheezing or shortness of breath.     albuterol (PROVENTIL) (2.5 MG/3ML) 0.083% nebulizer solution 2.5 mg every 2 (two)  hours as needed for wheezing or shortness of breath.     amLODipine (NORVASC) 10 MG tablet Take 10 mg by mouth at bedtime.     aspirin EC 81 MG tablet Take 81 mg by mouth 2 (two) times a week. Swallow whole.     atorvastatin (LIPITOR) 40 MG tablet Take 40 mg by mouth daily.     buPROPion (WELLBUTRIN XL) 150 MG 24 hr tablet Take 150 mg by mouth daily.     busPIRone (BUSPAR) 15 MG tablet Take 15 mg by mouth 2 (two) times daily.     celecoxib (CELEBREX) 200 MG capsule Take 200 mg by mouth daily. Additional 200 mg if needed a bedtime     clonazePAM (KLONOPIN) 1 MG tablet Take 1 mg by mouth once as needed for anxiety.     cyclobenzaprine (FLEXERIL) 5 MG tablet Take 1 tablet (5 mg total) by mouth 3 (three) times daily as needed for muscle spasms. 30 tablet 0   divalproex (DEPAKOTE) 500 MG DR tablet Take 500 mg by mouth 2 (two) times daily.     EPINEPHrine 0.3 mg/0.3 mL IJ SOAJ injection Inject 0.3 mg into the muscle as needed for anaphylaxis.     esomeprazole (NEXIUM) 40 MG capsule Take 40 mg by mouth in the morning. Additional 40 mg if needed at bedtime     Fluticasone-Salmeterol (ADVAIR) 500-50 MCG/DOSE AEPB Inhale 1 puff into the lungs in the morning.     furosemide (LASIX) 40 MG tablet Take 40 mg by mouth daily as needed for fluid or edema.     gabapentin (NEURONTIN) 300 MG capsule Take 300-600 mg by mouth See admin instructions. 300 mg in the morning, 600 mg at bedtime     hydrALAZINE (APRESOLINE) 25 MG tablet Take 25 mg by mouth 3 (three) times daily.     hydrochlorothiazide (HYDRODIURIL) 25 MG tablet Take 25 mg by mouth daily.     HYDROcodone-acetaminophen (NORCO/VICODIN) 5-325 MG tablet Take 1 tablet by mouth every 8 (eight) hours as needed for severe pain (pain score 7-10) or moderate pain (pain score 4-6).     hydrOXYzine (ATARAX) 25 MG tablet Take 25 mg by mouth at bedtime.     lisinopril (ZESTRIL) 40 MG tablet Take 40 mg by mouth daily.     metFORMIN (GLUCOPHAGE) 1000 MG tablet Take 1,000  mg by mouth 2 (two) times daily with a meal.     montelukast (SINGULAIR) 10 MG tablet Take 10 mg by mouth at bedtime.     oxyCODONE (ROXICODONE) 5 MG immediate  release tablet Take 1 tablet (5 mg total) by mouth every 4 (four) hours as needed for severe pain (pain score 7-10). 30 tablet 0   PARoxetine (PAXIL) 40 MG tablet Take 40 mg by mouth every morning.     sertraline (ZOLOFT) 25 MG tablet Take 25 mg by mouth daily.     spironolactone (ALDACTONE) 25 MG tablet Take 25 mg by mouth at bedtime.     tamsulosin (FLOMAX) 0.4 MG CAPS capsule Take 0.4 mg by mouth at bedtime.     testosterone cypionate (DEPOTESTOSTERONE CYPIONATE) 200 MG/ML injection Inject 0.5 mLs (100 mg total) into the muscle every 14 (fourteen) days. 10 mL 0   verapamil (CALAN-SR) 120 MG CR tablet Take 120 mg by mouth daily.     Vitamin D, Ergocalciferol, (DRISDOL) 1.25 MG (50000 UNIT) CAPS capsule Take 1 capsule (50,000 Units total) by mouth every Thursday. 8 capsule 0    ALLERGIES:   Allergies  Allergen Reactions   Morphine And Codeine Anaphylaxis   Shellfish Allergy Anaphylaxis   Betadine [Povidone Iodine] Itching   Chlorhexidine Hives    Patient reports never had issues CHG with mouth rinse.   Influenza Vaccines Hives   Metoprolol Rash    FAM HX: Family History  Problem Relation Age of Onset   Diabetes Mother    Hypertension Mother    Heart attack Mother    Osteoporosis Mother    Diabetes Father    Hypertension Father    Heart attack Father    Diabetes Sister    Hypertension Sister    Diabetes Brother    Hypertension Brother     Social History:   reports that he has been smoking cigarettes. He has a 12.5 pack-year smoking history. He has never used smokeless tobacco. He reports current drug use. Drug: Marijuana. He reports that he does not drink alcohol.  ROS: ROS: unable to perform complete ROS d/t AMS  Blood pressure (!) 154/94, pulse 98, temperature 97.9 F (36.6 C), temperature source Axillary,  resp. rate 18, height 6' (1.829 m), weight 110.6 kg, SpO2 94%. PHYSICAL EXAM: Physical Exam GEN lying flat in bed HEENT EOMI PERRL NECK flat neck veins PULM clear CV RRR ABD  soft GU: catheter in place EXT no LE edema NEURO intermittently answers questions SKIN warm and dry   Results for orders placed or performed during the hospital encounter of 07/03/23 (from the past 48 hours)  Glucose, capillary     Status: None   Collection Time: 07/03/23  1:02 AM  Result Value Ref Range   Glucose-Capillary 94 70 - 99 mg/dL    Comment: Glucose reference range applies only to samples taken after fasting for at least 8 hours.   Comment 1 Notify RN    Comment 2 Document in Chart   CBC with Differential/Platelet     Status: Abnormal   Collection Time: 07/03/23  4:50 AM  Result Value Ref Range   WBC 4.3 4.0 - 10.5 K/uL   RBC 2.65 (L) 4.22 - 5.81 MIL/uL   Hemoglobin 7.2 (L) 13.0 - 17.0 g/dL   HCT 81.1 (L) 91.4 - 78.2 %   MCV 92.8 80.0 - 100.0 fL   MCH 27.2 26.0 - 34.0 pg   MCHC 29.3 (L) 30.0 - 36.0 g/dL   RDW 95.6 (H) 21.3 - 08.6 %   Platelets 154 150 - 400 K/uL   nRBC 1.6 (H) 0.0 - 0.2 %   Neutrophils Relative % 33 %   Neutro Abs 1.4 (  L) 1.7 - 7.7 K/uL   Lymphocytes Relative 51 %   Lymphs Abs 2.2 0.7 - 4.0 K/uL   Monocytes Relative 12 %   Monocytes Absolute 0.5 0.1 - 1.0 K/uL   Eosinophils Relative 0 %   Eosinophils Absolute 0.0 0.0 - 0.5 K/uL   Basophils Relative 0 %   Basophils Absolute 0.0 0.0 - 0.1 K/uL   WBC Morphology MORPHOLOGY UNREMARKABLE    Smear Review MORPHOLOGY UNREMARKABLE    Immature Granulocytes 4 %   Abs Immature Granulocytes 0.19 (H) 0.00 - 0.07 K/uL   Reactive, Benign Lymphocytes PRESENT     Comment: Performed at [facility], [address] [place], [place] [address]  Procalcitonin     Status: None   Collection Time: 07/03/23  4:50 AM  Result Value Ref Range   Procalcitonin 0.17 ng/mL    Comment:        Interpretation: PCT  (Procalcitonin) <= 0.5 ng/mL: Systemic infection (sepsis) is not likely. Local bacterial infection is possible. (NOTE)       Sepsis PCT Algorithm           Lower Respiratory Tract                                      Infection PCT Algorithm    ----------------------------     ----------------------------         PCT < 0.25 ng/mL                PCT < 0.10 ng/mL          Strongly encourage             Strongly discourage   discontinuation of antibiotics    initiation of antibiotics    ----------------------------     -----------------------------       PCT 0.25 - 0.50 ng/mL            PCT 0.10 - 0.25 ng/mL               OR       >80% decrease in PCT            Discourage initiation of                                            antibiotics      Encourage discontinuation           of antibiotics    ----------------------------     -----------------------------         PCT >= 0.50 ng/mL              PCT 0.26 - 0.50 ng/mL               AND        <80% decrease in PCT             Encourage initiation of                                             antibiotics       Encourage continuation           of antibiotics    ----------------------------     -----------------------------  PCT >= 0.50 ng/mL                  PCT > 0.50 ng/mL               AND         increase in PCT                  Strongly encourage                                      initiation of antibiotics    Strongly encourage escalation           of antibiotics                                     -----------------------------                                           PCT <= 0.25 ng/mL                                                 OR                                        > 80% decrease in PCT                                      Discontinue / Do not initiate                                             antibiotics  Performed at [facility], [address] [place], [place] [address]   HIV  Antibody (routine testing w rflx)     Status: None   Collection Time: 07/03/23  5:05 AM  Result Value Ref Range   HIV Screen 4th Generation wRfx Non Reactive Non Reactive    Comment: Performed at [facility], [address] [place], [place] [address]  Comprehensive metabolic panel     Status: Abnormal   Collection Time: 07/03/23  5:05 AM  Result Value Ref Range   Sodium 135 135 - 145 mmol/L   Potassium 3.6 3.5 - 5.1 mmol/L   Chloride 112 (H) 98 - 111 mmol/L   CO2 20 (L) 22 - 32 mmol/L   Glucose, Bld 104 (H) 70 - 99 mg/dL    Comment: Glucose reference range applies only to samples taken after fasting for at least 8 hours.   BUN 34 (H) 6 - 20 mg/dL   Creatinine, Ser 0.98 (H) 0.61 - 1.24 mg/dL   Calcium 9.9 8.9 - 11.9 mg/dL   Total Protein >14.7 (H) 6.5 - 8.1 g/dL   Albumin <8.2 (L) 3.5 - 5.0 g/dL   AST 14 (L) 15 -  41 U/L   ALT <5 0 - 44 U/L   Alkaline Phosphatase 31 (L) 38 - 126 U/L   Total Bilirubin 0.5 0.0 - 1.2 mg/dL   GFR, Estimated 15 (L) >60 mL/min    Comment: (NOTE) Calculated using the CKD-EPI Creatinine Equation (2021)    Anion gap 3 (L) 5 - 15    Comment: Performed at [facility], [address], [place], [place] [address]  Magnesium     Status: None   Collection Time: 07/03/23  5:05 AM  Result Value Ref Range   Magnesium 1.8 1.7 - 2.4 mg/dL    Comment: Performed at [facility], [address] [place], [place] [address]  Phosphorus     Status: None   Collection Time: 07/03/23  5:05 AM  Result Value Ref Range   Phosphorus 3.8 2.5 - 4.6 mg/dL    Comment: Performed at [facility], [address] [place], [place] [address]  Glucose, capillary     Status: None   Collection Time: 07/03/23  7:47 AM  Result Value Ref Range   Glucose-Capillary 97 70 - 99 mg/dL    Comment: Glucose reference range applies only to samples taken after fasting for at least 8 hours.  Lactate dehydrogenase     Status: None    Collection Time: 07/03/23  8:49 AM  Result Value Ref Range   LDH 125 98 - 192 U/L    Comment: Performed at [facility], [address] [place], [place] [address]  Glucose, capillary     Status: None   Collection Time: 07/03/23 11:27 AM  Result Value Ref Range   Glucose-Capillary 91 70 - 99 mg/dL    Comment: Glucose reference range applies only to samples taken after fasting for at least 8 hours.  Glucose, capillary     Status: Abnormal   Collection Time: 07/03/23  4:43 PM  Result Value Ref Range   Glucose-Capillary 130 (H) 70 - 99 mg/dL    Comment: Glucose reference range applies only to samples taken after fasting for at least 8 hours.    No results found.  Assessment/Plan  Probable MM: anemia, hypercalcemia, lytic bone lesions, > 12 protein.  Labs from [facility] included IgG 13428, IgA 85, IgM less than 8, free kappa 158, free lambda 11.6, free kappa lambda ratio 13.50, M spike pending.    - for CT BM Bx tomorrow  - oncology seeing  - will await BM Bx before pursuing kidney bx- likely will not need  - B2 microglobulin added on for labs in AM per oncology  - will also add serum viscosity as total protein > 12 (if hyperviscosity suspected could be a role for plasma exchange)  - 24 hr urine in process  2.  Hypercalcemia:  - has acute encephalopathy  - Cca at least 13   - IVFs, increased to 125/ hr  - IV Lasix  - IV Zolendronic acid being given now  - expect to improve with these measures, if not unclear if could use calcitonin given shellfish allergy  3.  AKI on CKD 3:  - likely d/t MM  - improving with IVFs  - no role for dialysis at present  - holding diuretics and lisinopril  4.  Anemia:  - transfuse prn  5.  Acute encephalopathy:  - likely due to all the above  6.  Dispo: pending  Bufford Buttner 07/03/2023, 7:08 PM

## 2023-07-03 NOTE — Plan of Care (Signed)

## 2023-07-03 NOTE — Progress Notes (Signed)
 Patient admitted earlier this morning as a direct transfer from [facility] [place]  for concerns for multiple myeloma.  I have reviewed patient's medical records including this morning's H&P, current vitals, labs and medications myself.  Patient seen and examined at bedside.  Patient was apparently very agitated and restless overnight pulling out his IV as per nursing staff needing IV Ativan.  Creatinine 4.32 this morning.  Albumin is less than 1.5.  Hemoglobin 7.2.  Awaiting oncology evaluation and recommendation.  Repeat a.m. labs.  Continue IV fluids.

## 2023-07-03 NOTE — Consult Note (Addendum)
 [facility]  [phone] [phone]    HEMATOLOGY CONSULTATION  PURPOSE OF CONSULTATION/CHIEF COMPLAINT: Myeloma  Referring MD: Dr. Hanley Ben   HPI: Frank [name] is a 58 year old male patient who was brought to [facility] earlier this morning 07/03/2023.  It is noted in the chart that he was in the middle of a workup for multiple myeloma.  Apparently family requested transfer here as most of his providers are from [facility].  Patient presented at [facility] [place] with 1 week of generalized weakness.  Workup done showed low hemoglobin, elevated creatinine and abnormal protein levels.  Therefore hematology/oncology evaluation has been requested. Patient is seen today asleep in bed, awakens easily however he is obviously confused.  It is noted that he has a brace on his left upper extremity and he sees yes to pain of his arm.  He answers with monosyllables.  He does not appear to be in acute distress.  However, unable to get full information from patient.  History is being obtained from documentation. Medical history includes hypertension, hyperlipidemia, diabetes, seizure disorder.  He is now admitted with pneumonia, AKI, anemia, acute metabolic encephalopathy secondary to sepsis, and lytic bone lesions concerning for multiple myeloma. Surgical history includes cardiac cath in 2020, left humerus ORIF in 2023 and 2024, total hip arthroplasty and total knee arthroplasty. Social history includes tobacco use, includes marijuana use.  Previously denied alcohol use.    ASSESSMENT AND PLAN:  Lytic bone lesions Concern for multiple myeloma - Patient was being worked up at [facility] in [place] for multiple myeloma.  Patient with abnormal labs and imaging done in [place], concerning for myeloma. - Myeloma labs drawn 07/03/2023, pending results. - Bone survey is ordered, pending. - Bone marrow biopsy and aspiration is ordered, pending. - Oncology/Dr. Myna Hidalgo following  and will make further treatment recommendations.  Anemia, normocytic - May be secondary to malignancy - Hemoglobin 7.2 today - Recommend transfuse PRBC for hemoglobin <7.0 - Monitor CBC with differential closely  AKI - Elevated creatinine 4.32 - Baseline creatinine in the 1 range - May be related to myeloma - Continue gentle hydration - Avoid nephrotoxic agents - Nephrology eval  Altered mental status - Unclear etiology - Patient is confused.  Unable to verbally respond appropriately - Monitor patient closely  Hypertension Hyperlipidemia Diabetes - Monitor blood pressure levels - Monitor blood glucose levels, insulin coverage per protocol - Medicine following    Past Medical History:  Diagnosis Date   Anxiety    Arthritis    Asthma    Bipolar disorder (HCC)    Current every day smoker    Depression    Diabetes mellitus, type II (HCC)    Dyspnea    History of kidney stones    History of pneumonia    Hyperlipidemia    Hypertension    Morbid obesity (HCC)    Sedentary lifestyle    Seizures (HCC)    last seizure 12 yrs ago, no current problem   Sleep apnea    uses CPAP nightly  :  Past Surgical History:  Procedure Laterality Date   CARDIAC CATHETERIZATION  2020   carpal tunel Left    COLONOSCOPY     HARDWARE REMOVAL Left 07/26/2022   Procedure: HARDWARE REMOVAL ELBOW;  Surgeon: Roby Lofts, MD;  [facility];  Service: Orthopedics;  Laterality: Left;   ORIF HUMERUS FRACTURE Left 01/16/2022   Procedure: OPEN REDUCTION INTERNAL FIXATION (ORIF) DISTAL HUMERUS FRACTURE;  Surgeon: Truitt Merle  P, MD;  Location: [facility] OR;  Service: Orthopedics;  Laterality: Left;   ORIF HUMERUS FRACTURE Left 01/29/2023   Procedure: OPEN REDUCTION INTERNAL FIXATION (ORIF) DISTAL HUMERUS FRACTURE;  Surgeon: Laneta Pintos, MD;  Location: [facility] OR;  Service: Orthopedics;  Laterality: Left;   OTHER SURGICAL HISTORY     R & L shoulder   OTHER SURGICAL HISTORY Right    Knee  surgery x several   TOTAL HIP ARTHROPLASTY Left 12/26/2017   Procedure: LEFT TOTAL HIP ARTHROPLASTY ANTERIOR APPROACH;  Surgeon: Arnie Lao, MD;  Location: [facility];  Service: Orthopedics;  Laterality: Left;   TOTAL KNEE ARTHROPLASTY Left 11/23/2021   Procedure: LEFT TOTAL KNEE ARTHROPLASTY;  Surgeon: Arnie Lao, MD;  Location: [facility];  Service: Orthopedics;  Laterality: Left;  :  Allergies  Allergen Reactions   Morphine And Codeine Anaphylaxis   Shellfish Allergy Anaphylaxis   Betadine [Povidone Iodine] Itching   Chlorhexidine Hives    Patient reports never had issues CHG with mouth rinse.   Influenza Vaccines Hives   Metoprolol Rash  :   Family History  Problem Relation Age of Onset   Diabetes Mother    Hypertension Mother    Heart attack Mother    Osteoporosis Mother    Diabetes Father    Hypertension Father    Heart attack Father    Diabetes Sister    Hypertension Sister    Diabetes Brother    Hypertension Brother   :   Social History   Socioeconomic History   Marital status: Married    Spouse name: Not on file   Number of children: Not on file   Years of education: Not on file   Highest education level: Not on file  Occupational History   Not on file  Tobacco Use   Smoking status: Every Day    Current packs/day: 0.50    Average packs/day: 0.5 packs/day for 25.0 years (12.5 ttl pk-yrs)    Types: Cigarettes   Smokeless tobacco: Never  Vaping Use   Vaping status: Never Used  Substance and Sexual Activity   Alcohol use: No   Drug use: Yes    Types: Marijuana    Comment: 01/28/2023   Sexual activity: Yes  Other Topics Concern   Not on file  Social History Narrative   Not on file   Social Drivers of Health   Financial Resource Strain: Not on file  Food Insecurity: No Food Insecurity (07/03/2023)   Hunger Vital Sign    Worried About Running Out of Food in the Last Year: Never true    Ran Out of Food in the Last Year: Never  true  Transportation Needs: No Transportation Needs (07/03/2023)   PRAPARE - Administrator, Civil Service (Medical): No    Lack of Transportation (Non-Medical): No  Physical Activity: Not on file  Stress: Not on file  Social Connections: Not on file  Intimate Partner Violence: Unknown (07/03/2023)   Humiliation, Afraid, Rape, and Kick questionnaire    Fear of Current or Ex-Partner: Patient unable to answer    Emotionally Abused: No    Physically Abused: No    Sexually Abused: No  :   CURRENT MEDS: Current Facility-Administered Medications  Medication Dose Route Frequency Provider Last Rate Last Admin   acetaminophen (TYLENOL) tablet 650 mg  650 mg Oral Q6H PRN Reesa Cannon N, DO       azithromycin (ZITHROMAX) tablet 250 mg  250 mg Oral Daily Hall,  Charlotta Cook, DO   250 mg at 07/03/23 0538   cefTRIAXone (ROCEPHIN) 1 g in sodium chloride 0.9 % 100 mL IVPB  1 g Intravenous Q24H Reesa Cannon N, DO   Stopped at 07/03/23 1610   haloperidol lactate (HALDOL) injection 1-2 mg  1-2 mg Intravenous Q4H PRN Audria Leather, MD       insulin aspart (novoLOG) injection 0-5 Units  0-5 Units Subcutaneous QHS Hall, Carole N, DO       insulin aspart (novoLOG) injection 0-9 Units  0-9 Units Subcutaneous TID WC Hall, Carole N, DO       lactated ringers infusion   Intravenous Continuous Audria Leather, MD 100 mL/hr at 07/03/23 0802 Rate Change at 07/03/23 0802   LORazepam (ATIVAN) injection 2 mg  2 mg Intravenous Q6H PRN Reesa Cannon N, DO       melatonin tablet 5 mg  5 mg Oral QHS PRN Reesa Cannon N, DO   5 mg at 07/03/23 0309   Oral care mouth rinse  15 mL Mouth Rinse PRN Reesa Cannon N, DO       oxyCODONE (Oxy IR/ROXICODONE) immediate release tablet 5 mg  5 mg Oral Q6H PRN Reesa Cannon N, DO       polyethylene glycol (MIRALAX / GLYCOLAX) packet 17 g  17 g Oral Daily PRN Reesa Cannon N, DO       prochlorperazine (COMPAZINE) injection 5 mg  5 mg Intravenous Q6H PRN Bary Boss, DO         PHYSICAL EXAMINATION: ECOG PERFORMANCE STATUS: 4 - Bedbound  Vitals:   07/03/23 0123 07/03/23 0430  BP:  (!) 157/101  Pulse:  98  Resp: 19   Temp:  (!) 97.5 F (36.4 C)  SpO2:     Filed Weights   07/03/23 0222 07/03/23 0430  Weight: 243 lb 14.4 oz (110.6 kg) 243 lb 14.4 oz (110.6 kg)    GENERAL: alert, no distress and comfortable SKIN: skin color, texture, turgor are normal, no rashes or significant lesions EYES: normal, conjunctiva are pink and non-injected, sclera clear OROPHARYNX: no exudate, no erythema and lips, buccal mucosa, and tongue normal  NECK: supple, thyroid normal size, non-tender, without nodularity LYMPH: no palpable lymphadenopathy in the cervical, axillary or inguinal LUNGS: clear to auscultation and percussion with normal breathing effort HEART: regular rate & rhythm and no murmurs and no lower extremity edema ABDOMEN: abdomen soft, non-tender and normal bowel sounds MUSCULOSKELETAL: no cyanosis of digits and no clubbing  PSYCH: alert & oriented x 3 with fluent speech NEURO: no focal motor/sensory deficits   LABS: Lab Results  Component Value Date   WBC 4.3 07/03/2023   HGB 7.2 (L) 07/03/2023   HCT 24.6 (L) 07/03/2023   MCV 92.8 07/03/2023   PLT 154 07/03/2023    Lab Results  Component Value Date   WBC 4.3 07/03/2023   HGB 7.2 (L) 07/03/2023   HCT 24.6 (L) 07/03/2023   PLT 154 07/03/2023   GLUCOSE 104 (H) 07/03/2023   ALT <5 07/03/2023   AST 14 (L) 07/03/2023   NA 135 07/03/2023   K 3.6 07/03/2023   CL 112 (H) 07/03/2023   CREATININE 4.32 (H) 07/03/2023   BUN 34 (H) 07/03/2023   CO2 20 (L) 07/03/2023   HGBA1C 6.7 (H) 01/29/2023    XR Lumbar Spine Complete Result Date: 06/05/2023 XRs of the lumbar spine from 06/05/2023 were independently reviewed and interpreted, showing grade 1 spondylolisthesis at L5/S1. Disc height loss at L5/S1. No  other significant degenerative changes seen. No lesions seen within the vertebral bodies. No  fracture or dislocation seen.     The total time spent in the appointment was 55 minutes encounter with patients including review of chart and various tests results, discussions about plan of care and coordination of care plan   All questions were answered. The patient knows to call the clinic with any problems, questions or concerns. No barriers to learning was detected.  Thank you for the courtesy of this consultation, Jacqualin Mate, NP  4/17/202510:52 AM  ADDENDUM: I saw and examined Mr. Molyneux.  This is a very complicated issue.  He clearly has myeloma from the numbers.  His albumin is incredibly low at less than 1.5.  His total protein is greater than 12.  He is really not that arousable.  He opens his eyes.  I think he does have hypercalcemia.  When his calcium is corrected to his low albumin his calcium is easily over 13.  Unfortunately, we cannot use calcitonin because he has a shellfish allergy.  His renal insufficiency.  I am sure that he likely has hide light chains.  I am wondering if this is a situation where we may have to think about doing a plasma exchange with him to try to get down his myeloma protein.  It would not help to have Renal involved with this.  I think is going to have a bone marrow biopsy done.  He needs a 24-hour urine done.  He needs to have a Foley catheter in place in order to make an accurate collection.  Again I worry about the fact that he has such a high protein level.  I will have to think this is going to be an IgG myeloma.  We really have to get his calcium back down.  I really wish we could use calcitonin but again he has a shellfish allergy.  It is certainly conceivable that he may have to be moved over to [facility] if we are going to need to do any plasma exchange.  Again, this is incredibly complicated.  This will certainly take some time in my opinion.  Hopefully, we can try to get his mental status a little bit better.  We will have to  follow him daily.  Again this is very complex.  [name], MD  Royston Cornea 1:5

## 2023-07-03 NOTE — Progress Notes (Addendum)
 Patient has pulled his IV out once and his primafit multiple times. He is restless, very confused and getting agitated. Notified Dr. Del Favia. New order for IV ativan ordered and given. Will continue to monitor.   Patient continues to be very restless pulling at lines and putting his legs to the edge of the bed. Notified Dr. Del Favia. New safety sitter order placed. Will continue to monitor.

## 2023-07-03 NOTE — Progress Notes (Signed)
 Notified MD Alekh that per staffing unable to provide 1:1 sitter. Pt continues to be confused, pulling at lines and not easily redirected/reoriented. Tele monitoring ordered.

## 2023-07-04 ENCOUNTER — Inpatient Hospital Stay (HOSPITAL_COMMUNITY)

## 2023-07-04 ENCOUNTER — Encounter (HOSPITAL_COMMUNITY): Payer: Self-pay | Admitting: Internal Medicine

## 2023-07-04 DIAGNOSIS — C9000 Multiple myeloma not having achieved remission: Secondary | ICD-10-CM

## 2023-07-04 DIAGNOSIS — R531 Weakness: Secondary | ICD-10-CM | POA: Diagnosis not present

## 2023-07-04 LAB — COMPREHENSIVE METABOLIC PANEL WITH GFR
ALT: 8 U/L (ref 0–44)
AST: 12 U/L — ABNORMAL LOW (ref 15–41)
Albumin: <1.5 g/dL — ABNORMAL LOW (ref 3.5–5.0)
Alkaline Phosphatase: 31 U/L — ABNORMAL LOW (ref 38–126)
Anion gap: 3 — ABNORMAL LOW (ref 5–15)
BUN: 34 mg/dL — ABNORMAL HIGH (ref 6–20)
CO2: 22 mmol/L (ref 22–32)
Calcium: 10.5 mg/dL — ABNORMAL HIGH (ref 8.9–10.3)
Chloride: 113 mmol/L — ABNORMAL HIGH (ref 98–111)
Creatinine, Ser: 4.1 mg/dL — ABNORMAL HIGH (ref 0.61–1.24)
GFR, Estimated: 16 mL/min — ABNORMAL LOW (ref >60)
Glucose, Bld: 87 mg/dL (ref 70–99)
Potassium: 3.5 mmol/L (ref 3.5–5.1)
Sodium: 138 mmol/L (ref 135–145)
Total Bilirubin: 0.5 mg/dL (ref 0.0–1.2)
Total Protein: >12 g/dL — ABNORMAL HIGH (ref 6.5–8.1)

## 2023-07-04 LAB — CBC WITH DIFFERENTIAL/PLATELET
Abs Immature Granulocytes: 0.45 10*3/uL — ABNORMAL HIGH (ref 0.00–0.07)
Basophils Absolute: 0 10*3/uL (ref 0.0–0.1)
Basophils Relative: 0 %
Eosinophils Absolute: 0 10*3/uL (ref 0.0–0.5)
Eosinophils Relative: 0 %
HCT: 26.1 % — ABNORMAL LOW (ref 39.0–52.0)
Hemoglobin: 7.5 g/dL — ABNORMAL LOW (ref 13.0–17.0)
Immature Granulocytes: 9 %
Lymphocytes Relative: 52 %
Lymphs Abs: 2.8 10*3/uL (ref 0.7–4.0)
MCH: 26.9 pg (ref 26.0–34.0)
MCHC: 28.7 g/dL — ABNORMAL LOW (ref 30.0–36.0)
MCV: 93.5 fL (ref 80.0–100.0)
Monocytes Absolute: 0.9 10*3/uL (ref 0.1–1.0)
Monocytes Relative: 17 %
Neutro Abs: 1.2 10*3/uL — ABNORMAL LOW (ref 1.7–7.7)
Neutrophils Relative %: 22 %
Platelets: 154 10*3/uL (ref 150–400)
RBC: 2.79 MIL/uL — ABNORMAL LOW (ref 4.22–5.81)
RDW: 26.8 % — ABNORMAL HIGH (ref 11.5–15.5)
WBC: 5.3 10*3/uL (ref 4.0–10.5)
nRBC: 0.6 % — ABNORMAL HIGH (ref 0.0–0.2)

## 2023-07-04 LAB — GLUCOSE, CAPILLARY
Glucose-Capillary: 74 mg/dL (ref 70–99)
Glucose-Capillary: 106 mg/dL — ABNORMAL HIGH (ref 70–99)
Glucose-Capillary: 163 mg/dL — ABNORMAL HIGH (ref 70–99)
Glucose-Capillary: 143 mg/dL — ABNORMAL HIGH (ref 70–99)

## 2023-07-04 LAB — MAGNESIUM: Magnesium: 1.6 mg/dL — ABNORMAL LOW (ref 1.7–2.4)

## 2023-07-04 MED ORDER — SODIUM CHLORIDE 0.9 % IV SOLN: INTRAVENOUS | Status: DC

## 2023-07-04 MED ORDER — NALOXONE HCL 0.4 MG/ML IJ SOLN
INTRAMUSCULAR | Status: AC
  Filled 2023-07-04: qty 1

## 2023-07-04 MED ORDER — FAMOTIDINE 200 MG/20ML IV SOLN
40.0000 mg | Freq: Two times a day (BID) | INTRAVENOUS | Status: DC
  Administered 2023-07-04: 40 mg via INTRAVENOUS
  Filled 2023-07-04 (×2): qty 4

## 2023-07-04 MED ORDER — FENTANYL CITRATE (PF) 100 MCG/2ML IJ SOLN
INTRAMUSCULAR | Status: AC
Start: 2023-07-04 — End: ?
  Filled 2023-07-04: qty 2

## 2023-07-04 MED ORDER — MIDAZOLAM HCL 2 MG/2ML IJ SOLN
INTRAMUSCULAR | Status: AC
  Filled 2023-07-04: qty 2

## 2023-07-04 MED ORDER — CALCITONIN (SALMON) 200 UNIT/ML IJ SOLN
400.0000 [IU] | Freq: Two times a day (BID) | INTRAMUSCULAR | Status: AC
  Administered 2023-07-04 – 2023-07-05 (×4): 400 [IU] via SUBCUTANEOUS
  Filled 2023-07-04 (×4): qty 2

## 2023-07-04 MED ORDER — FENTANYL CITRATE (PF) 100 MCG/2ML IJ SOLN
INTRAMUSCULAR | Status: AC | PRN
  Administered 2023-07-04: 50 ug via INTRAVENOUS

## 2023-07-04 MED ORDER — HYDRALAZINE HCL 20 MG/ML IJ SOLN
5.0000 mg | INTRAMUSCULAR | Status: DC | PRN
  Administered 2023-07-04: 5 mg via INTRAVENOUS
  Filled 2023-07-04: qty 1

## 2023-07-04 MED ORDER — LACTATED RINGERS IV SOLN: INTRAVENOUS | Status: DC

## 2023-07-04 MED ORDER — CLONIDINE HCL 0.2 MG/24HR TD PTWK
0.2000 mg | MEDICATED_PATCH | TRANSDERMAL | Status: DC
  Administered 2023-07-04: 0.2 mg via TRANSDERMAL
  Filled 2023-07-04: qty 1

## 2023-07-04 MED ORDER — MIDAZOLAM HCL 2 MG/2ML IJ SOLN
INTRAMUSCULAR | Status: AC | PRN
  Administered 2023-07-04 (×2): 1 mg via INTRAVENOUS

## 2023-07-04 MED ORDER — SODIUM CHLORIDE 0.9 % IV SOLN
40.0000 mg | INTRAVENOUS | Status: DC
  Administered 2023-07-05 – 2023-07-06 (×2): 40 mg via INTRAVENOUS
  Filled 2023-07-04 (×4): qty 4

## 2023-07-04 MED ORDER — FLUCONAZOLE IN SODIUM CHLORIDE 200-0.9 MG/100ML-% IV SOLN
200.0000 mg | INTRAVENOUS | Status: DC
  Administered 2023-07-04 – 2023-07-08 (×3): 200 mg via INTRAVENOUS
  Filled 2023-07-04 (×3): qty 100

## 2023-07-04 MED ORDER — DEXAMETHASONE SODIUM PHOSPHATE 10 MG/ML IJ SOLN
40.0000 mg | INTRAMUSCULAR | Status: AC
  Administered 2023-07-04 – 2023-07-06 (×3): 40 mg via INTRAVENOUS
  Filled 2023-07-04 (×3): qty 4

## 2023-07-04 MED ORDER — HYDRALAZINE HCL 20 MG/ML IJ SOLN
10.0000 mg | INTRAMUSCULAR | Status: DC | PRN
  Administered 2023-07-05: 10 mg via INTRAVENOUS
  Filled 2023-07-04: qty 1

## 2023-07-04 MED ORDER — DIPHENHYDRAMINE HCL 50 MG/ML IJ SOLN
INTRAMUSCULAR | Status: AC | PRN
  Administered 2023-07-04: 50 mg via INTRAVENOUS

## 2023-07-04 MED ORDER — DIPHENHYDRAMINE HCL 50 MG/ML IJ SOLN
INTRAMUSCULAR | Status: AC
Start: 2023-07-04 — End: ?
  Filled 2023-07-04: qty 1

## 2023-07-04 MED ORDER — HALOPERIDOL LACTATE 5 MG/ML IJ SOLN
2.0000 mg | INTRAMUSCULAR | Status: DC | PRN
  Administered 2023-07-04: 2 mg via INTRAVENOUS
  Administered 2023-07-04: 4 mg via INTRAVENOUS
  Administered 2023-07-05 – 2023-07-08 (×8): 5 mg via INTRAVENOUS
  Filled 2023-07-04 (×10): qty 1

## 2023-07-04 MED ORDER — FLUMAZENIL 0.5 MG/5ML IV SOLN
INTRAVENOUS | Status: AC
  Filled 2023-07-04: qty 5

## 2023-07-04 NOTE — Consult Note (Addendum)
 Renal Service Consult Note [facility]  Frank [name]. 07/04/2023 Frank Sandifer, MD Requesting Physician: Dr. Maury [name]  Reason for Consult: Renal failure HPI: The patient is a 58 y.o. year-old w/ PMH as below who presented to [facility] OSH due to AMS on 4/13 thru 4/16, dx was CAP, AME, sepsis, AKI, hypercalcemia, anemia and lytic bone lesions w/ concern for myeloma. Family requested transfer to [facility]. Prior symptoms were gen'd weakness. Creat on admit was 5.8, down to 5.3 on transfer to [facility] 4/17.  We are asked to see for renal failure.    Pt seen in room. He is in restraints, RN says he is not violent but just agitated and when sedation wear's off is trying to get out of bed. Does not answer questions very well. Open's eyes and says high. No hx obtained.   Pt has rec'd here IV pepcid , IV rocephin , LR at 125/hr, IV diflucan , prn haldol , benadryl , fentanyl , IV hydralazine , versed . Also decadron  40mg  today IV, lasix  40mg  IV yesterday x 1, miacalcin  [place] at 3 pm today  ROS -n/a  PMH: Anxiety Bipolar d/o Depression DM 2 HL HTN Morbid obesity Seizures OSA   Past Surgical History  Past Surgical History:  Procedure Laterality Date   CARDIAC CATHETERIZATION  2020   carpal tunel Left    COLONOSCOPY     HARDWARE REMOVAL Left 07/26/2022   Procedure: HARDWARE REMOVAL ELBOW;  Surgeon: Frank Pintos, MD;  [facility]: [facility] OR;  Service: Orthopedics;  Laterality: Left;   ORIF HUMERUS FRACTURE Left 01/16/2022   Procedure: OPEN REDUCTION INTERNAL FIXATION (ORIF) DISTAL HUMERUS FRACTURE;  Surgeon: Frank Pintos, MD;  Location: [facility] OR;  Service: Orthopedics;  Laterality: Left;   ORIF HUMERUS FRACTURE Left 01/29/2023   Procedure: OPEN REDUCTION INTERNAL FIXATION (ORIF) DISTAL HUMERUS FRACTURE;  Surgeon: Frank Pintos, MD;  [facility]: [facility] OR;  Service: Orthopedics;  Laterality: Left;   OTHER SURGICAL HISTORY     R & L shoulder   OTHER SURGICAL HISTORY Right    Knee surgery x  several   TOTAL HIP ARTHROPLASTY Left 12/26/2017   Procedure: LEFT TOTAL HIP ARTHROPLASTY ANTERIOR APPROACH;  Surgeon: Frank Lao, MD;  [facility];  Service: Orthopedics;  Laterality: Left;   TOTAL KNEE ARTHROPLASTY Left 11/23/2021   Procedure: LEFT TOTAL KNEE ARTHROPLASTY;  Surgeon: Frank Lao, MD;  [facility]: [facility];  Service: Orthopedics;  Laterality: Left;   Family History  Family History  Problem Relation Age of Onset   Diabetes Mother    Hypertension Mother    Heart attack Mother    Osteoporosis Mother    Diabetes Father    Hypertension Father    Heart attack Father    Diabetes Sister    Hypertension Sister    Diabetes Brother    Hypertension Brother    Social History  reports that he has been smoking cigarettes. He has a 12.5 pack-year smoking history. He has never used smokeless tobacco. He reports current drug use. Drug: Marijuana. He reports that he does not drink alcohol. Allergies  Allergies  Allergen Reactions   Morphine And Codeine Anaphylaxis   Shellfish Allergy Anaphylaxis   Betadine  [Povidone Iodine ] Itching   Bupropion Other (See Comments)    Shaking of the body, hallucinations    Chlorhexidine  Hives    Patient reports never had issues CHG with mouth rinse.   Influenza Vaccines Hives   Metoprolol  Rash   Home medications Prior to Admission medications  Medication Sig Start Date End Date Taking? Authorizing Provider  albuterol  (PROVENTIL  HFA;VENTOLIN  HFA) 108 (90 Base) MCG/ACT inhaler Inhale 2 puffs into the lungs every 6 (six) hours as needed for wheezing or shortness of breath.   Yes [provider]  albuterol  (PROVENTIL ) (2.5 MG/3ML) 0.083% nebulizer solution 2.5 mg every 2 (two) hours as needed for wheezing or shortness of breath.   Yes [provider]  aspirin  EC 81 MG tablet Take 81 mg by mouth daily. Swallow whole.   Yes [provider]  busPIRone  (BUSPAR ) 15 MG tablet Take 15 mg by mouth at  bedtime.   Yes [provider]  celecoxib  (CELEBREX ) 200 MG capsule Take 200 mg by mouth daily.   Yes [provider]  clonazePAM (KLONOPIN) 1 MG tablet Take 1 mg by mouth once as needed for anxiety.   Yes [provider]  cyclobenzaprine  (FLEXERIL ) 5 MG tablet Take 1 tablet (5 mg total) by mouth 3 (three) times daily as needed for muscle spasms. Patient taking differently: Take 5 mg by mouth daily as needed for muscle spasms. 01/30/23  Yes Frank Gores, PA-C  divalproex  (DEPAKOTE ) 500 MG DR tablet Take 500 mg by mouth 2 (two) times daily.   Yes [provider]  EPINEPHrine  0.3 mg/0.3 mL IJ SOAJ injection Inject 0.3 mg into the muscle as needed for anaphylaxis. 08/28/21  Yes [provider]  esomeprazole (NEXIUM) 40 MG capsule Take 40 mg by mouth in the morning.   Yes [provider]  Fluticasone -Salmeterol (ADVAIR) 500-50 MCG/DOSE AEPB Inhale 2 puffs into the lungs in the morning.   Yes [provider]  furosemide  (LASIX ) 40 MG tablet Take 40 mg by mouth daily as needed for fluid or edema.   Yes [provider]  gabapentin  (NEURONTIN ) 300 MG capsule Take 300-600 mg by mouth See admin instructions. 300 mg in the morning, 600 mg at bedtime   Yes [provider]  hydrALAZINE  (APRESOLINE ) 10 MG tablet Take 10 mg by mouth in the morning and at bedtime.   Yes [provider]  hydrochlorothiazide  (HYDRODIURIL ) 25 MG tablet Take 25 mg by mouth daily.   Yes [provider]  lisinopril  (ZESTRIL ) 40 MG tablet Take 40 mg by mouth daily. 03/25/23  Yes [provider]  metFORMIN  (GLUCOPHAGE ) 1000 MG tablet Take 1,000 mg by mouth 2 (two) times daily with a meal.   Yes [provider]  montelukast  (SINGULAIR ) 10 MG tablet Take 10 mg by mouth at bedtime.   Yes [provider]  OVER THE COUNTER MEDICATION Take 400 mg by mouth at bedtime. Burdock root   Yes [provider]  oxyCODONE   (ROXICODONE ) 5 MG immediate release tablet Take 1 tablet (5 mg total) by mouth every 4 (four) hours as needed for severe pain (pain score 7-10). 01/30/23  Yes McClung, Genevive Ket, PA-C  PARoxetine  (PAXIL ) 40 MG tablet Take 40 mg by mouth every morning.   Yes [provider]  spironolactone  (ALDACTONE ) 25 MG tablet Take 25 mg by mouth at bedtime. 10/07/21  Yes [provider]  tamsulosin  (FLOMAX ) 0.4 MG CAPS capsule Take 0.4 mg by mouth at bedtime. 10/07/21  Yes [provider]  testosterone  cypionate (DEPOTESTOSTERONE CYPIONATE) 200 MG/ML injection Inject 0.5 mLs (100 mg total) into the muscle every 14 (fourteen) days. 09/17/16  Yes Nida, Gebreselassie W, MD  verapamil  (CALAN -SR) 120 MG CR tablet Take 120 mg by mouth daily.   Yes [provider]  Vitamin  D, Ergocalciferol , (DRISDOL ) 1.25 MG (50000 UNIT) CAPS capsule Take 1 capsule (50,000 Units total) by mouth every Thursday. Patient taking differently: Take 50,000 Units by mouth once a week. Tuesday 02/06/23  Yes Frank Gores, PA-C  buPROPion (WELLBUTRIN XL) 150 MG 24 hr tablet Take 150 mg by mouth daily. Patient not taking: Reported on 07/04/2023    [provider]     Vitals:   07/04/23 0810 07/04/23 0833 07/04/23 0952 07/04/23 1349  BP: (!) 209/101 (!) 155/94 (!) 137/99 (!) 168/108  Pulse: (!) 120 (!) 106 98 (!) 106  Resp: (!) 21 20  20   Temp:    98.2 F (36.8 C)  TempSrc:    Oral  SpO2: 98% 95% 100% (!) 89%  Weight:      Height:       Exam Gen lethargic, in restraints, sleeping, awakens but doesn't answer question, falls back asleep No rash, cyanosis or gangrene Sclera anicteric, throat dry No jvd or bruits Chest clear bilat to bases, no rales/ wheezing RRR no MRG Abd soft ntnd no mass or ascites +bs GU condom cath in place, urine clear yellow MS no joint effusions or deformity Ext no  LE or UE edema, no other edema Neuro is lethargic, no myoclonus or asterixis    Renal-related home  meds: Celecoxib  200 every day Lasix  40 every day prn Hydrochlorothiazide  25 every day Hydralazine  10 bid Lisinopril  40 every day  Aldactone  25 hs Verapamil  SR 120 every day Others: wellbutrin xl, flomax , paxil , Oxy IR, singulair , metformin , gabapentin , advair, nexium, depakote , klonopin, buspar , aspirin , albuterol  prn   Date   Creat  eGFR (ml/min) 2019   1.00- 1.09 2023   0.91- 0.93 2024   1.11- 1.52 53 - >60 ml/min 06/30/23  5.80  At OSH 4/17   4.32  At [facility] 07/04/23  4.10  Na 138  K 3.5  CO2 22 BUN 34  creat 4.10  alb < 1.5   LDH 125 CRP < 0.5   Hb 7.5  plt 154  WBC 5 K  vit D 25-hydroxy - 17 BP's high to wnl 140-180/90- 100, HR 98-120, RR 18   Assessment/ Plan: AKi on CKD 3a - b/l creat 1.1- 1.5 from 2024, eGFR 53- >60 ml/min. Here creat was 5.8 on presentation at OSH and down to 4.10 here today 4 days later. Making urine. Bp's are high, looks dry on exam. AKI is due to possible myeloma kidney +/- hyperCa++ +/- vol depletion +/- ACEi (home med). Cont supportive care, IVF's as below.  HTN - uncontrolled,was on 3 BP meds and 2 diuretics at home. Not taking pills here. Will start catapres  patch and scheduled IV metoprolol  q 6 hrs.  Volume - looks low on volume, cont LR at 125 cc/hr. Get baseline CXR and if negative will bolus 1-2 liters tonight.  Hypercalcemia - looks resolved for now. Possible myeloma, w/u in progress, oncology following. SP zometa  here x 1, also getting calcitonin.  Bipolar d/o      Larry Poag  MD [facility] 07/04/2023, 5:24 PM  Recent Labs  Lab 07/03/23 0450 07/03/23 0505 07/04/23 0547  HGB 7.2*  --  7.5*  ALBUMIN   --  <1.5* <1.5*  CALCIUM   --  9.9 10.5*  PHOS  --  3.8  --   CREATININE  --  4.32* 4.10*  K  --  3.6 3.5   Inpatient medications:  azithromycin   250 mg Oral Daily   calcitonin  400 Units Subcutaneous BID  dexamethasone  (DECADRON ) injection  40 mg Intravenous Q24H   insulin  aspart  0-5 Units Subcutaneous QHS   insulin  aspart  0-9 Units  Subcutaneous TID WC    cefTRIAXone  (ROCEPHIN )  IV 1 g (07/04/23 0554)   [START ON 07/05/2023] famotidine  (PEPCID ) IV     fluconazole  (DIFLUCAN ) IV 200 mg (07/04/23 1024)   lactated ringers  125 mL/hr at 07/04/23 1357   acetaminophen , haloperidol  lactate, hydrALAZINE , LORazepam , melatonin, mouth rinse, oxyCODONE , polyethylene glycol, prochlorperazine 

## 2023-07-04 NOTE — Evaluation (Signed)
 Clinical/Bedside Swallow Evaluation Patient Details  Name: Frank [name]. MRN: [id number] Date of Birth: Jul 28, 1965  Today's Date: 07/04/2023 Time: SLP Start Time (ACUTE ONLY): 1140 SLP Stop Time (ACUTE ONLY): 1154 SLP Time Calculation (min) (ACUTE ONLY): 14 min  Past Medical History:  Past Medical History:  Diagnosis Date   Anxiety    Arthritis    Asthma    Bipolar disorder (HCC)    Current every day smoker    Depression    Diabetes mellitus, type II (HCC)    Dyspnea    History of kidney stones    History of pneumonia    Hyperlipidemia    Hypertension    Morbid obesity (HCC)    Sedentary lifestyle    Seizures (HCC)    last seizure 12 yrs ago, no current problem   Sleep apnea    uses CPAP nightly   Past Surgical History:  Past Surgical History:  Procedure Laterality Date   CARDIAC CATHETERIZATION  2020   carpal tunel Left    COLONOSCOPY     HARDWARE REMOVAL Left 07/26/2022   Procedure: HARDWARE REMOVAL ELBOW;  Surgeon: Laneta Pintos, MD;  Location: [facility] OR;  Service: Orthopedics;  Laterality: Left;   ORIF HUMERUS FRACTURE Left 01/16/2022   Procedure: OPEN REDUCTION INTERNAL FIXATION (ORIF) DISTAL HUMERUS FRACTURE;  Surgeon: Laneta Pintos, MD;  Location: [facility] OR;  Service: Orthopedics;  Laterality: Left;   ORIF HUMERUS FRACTURE Left 01/29/2023   Procedure: OPEN REDUCTION INTERNAL FIXATION (ORIF) DISTAL HUMERUS FRACTURE;  Surgeon: Laneta Pintos, MD;  Location: [facility] OR;  Service: Orthopedics;  Laterality: Left;   OTHER SURGICAL HISTORY     R & L shoulder   OTHER SURGICAL HISTORY Right    Knee surgery x several   TOTAL HIP ARTHROPLASTY Left 12/26/2017   Procedure: LEFT TOTAL HIP ARTHROPLASTY ANTERIOR APPROACH;  Surgeon: Arnie Lao, MD;  Location: [facility];  Service: Orthopedics;  Laterality: Left;   TOTAL KNEE ARTHROPLASTY Left 11/23/2021   Procedure: LEFT TOTAL KNEE ARTHROPLASTY;  Surgeon: Arnie Lao, MD;  Location: [facility];  Service:  Orthopedics;  Laterality: Left;   HPI:  58 yo who initially presented at [facility] 4/13 due to AMS, admitted with sepsis secondary to CAP,  acute metabolic encephalopathy secondary to sepsis, AKI, hypercalcemia, symptomatic anemia requiring 3 unit PRBCs, and lytic bone lesions with concern for multiple myeloma. He was transferred to [facility] for further w/u of suspected multiple myeloma.     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 It takes a lot of stimulation to get him waking up with SLP, and he remains very groggy. He also requires repeated repositioning. Iniitl straw sip while still waking up resulted in an immediate cough, but once given more stimulation he initiated more straw drinking with no overt s/s of aspiration. Note that he did not allow SLP to get a good look at his oral cavity, but he was spitting out thick secretions throughout this eval once given a little water , so would encourage staff to continue efforts at oral care as much as possible. Pt could not be sufficiently awakened to take any purees off of a spoon. Although pt is definitely too lethargic to take  POs at this time, hopeful that this will improve as medication effects subside, so will not remove diet order but did discuss with RN to hold POs until that time. Encouraged her to proceed cautiously and message SLP group chat over the weekend if any overt difficulties arise once alert. Anticipate that he may need ongoing assistance during meals in light of AMS.   SLP Visit Diagnosis: Dysphagia, unspecified (R13.10)    Aspiration Risk       Diet Recommendation Regular;Thin liquid (once he is alert)    Liquid Administration via:  Cup;Straw Medication Administration: Whole meds with puree Supervision: Staff to assist with self feeding;Full supervision/cueing for compensatory strategies Compensations: Minimize environmental distractions;Slow rate;Small sips/bites Postural Changes: Seated upright at 90 degrees;Remain upright for at least 30 minutes after po intake    Other  Recommendations Oral Care Recommendations: Oral care QID    Recommendations for follow up therapy are one component of a multi-disciplinary discharge planning process, led by the attending physician.  Recommendations may be updated based on patient status, additional functional criteria and insurance authorization.  Follow up Recommendations Other (comment) (TBA)      Assistance Recommended at Discharge    Functional Status Assessment Patient has had a recent decline in their functional status and demonstrates the ability to make significant improvements in function in a reasonable and predictable amount of time.  Frequency and Duration min 2x/week  2 weeks       Prognosis Prognosis for improved oropharyngeal function: Good Barriers to Reach Goals: Cognitive deficits      Swallow Study   General HPI: 58 yo who initially presented at [facility] 4/13 due to AMS, admitted with sepsis secondary to CAP,  acute metabolic encephalopathy secondary to sepsis, AKI, hypercalcemia, symptomatic anemia requiring 3 unit PRBCs, and lytic bone lesions with concern for multiple myeloma. He was transferred to [facility] for further w/u of suspected multiple myeloma.     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), GERD, hypertension, hyperlipidemia, type 2 diabetes, BPH, seizure disorder Type of Study: Bedside Swallow Evaluation Previous Swallow Assessment: none in chart Diet Prior to this Study: Regular;Thin liquids (Level 0) Temperature Spikes Noted: No Respiratory Status: Room  air History of Recent Intubation: No Behavior/Cognition: Lethargic/Drowsy Oral Cavity Assessment: Dried secretions Oral Cavity - Dentition: Poor condition;Missing dentition Self-Feeding Abilities: Total assist Patient Positioning: Upright in bed Baseline Vocal Quality: Normal Volitional Cough: Cognitively unable to elicit Volitional Swallow: Unable to elicit    Oral/Motor/Sensory Function Overall Oral Motor/Sensory Function: Other (comment) (not following commands for direct assessment)   Ice Chips Ice chips: Not tested   Thin Liquid Thin Liquid: Impaired Presentation: Straw;Spoon Oral Phase Impairments: Poor awareness of bolus Pharyngeal  Phase Impairments: Cough - Immediate    Nectar Thick Nectar Thick Liquid: Not tested   Honey Thick Honey Thick Liquid: Not tested   Puree Puree: Not tested   Solid     Solid: Not tested      Beth Brooke., M.A. CCC-SLP Acute Rehabilitation Services Office: [phone]  Secure chat preferred  07/04/2023,12:00 PM

## 2023-07-04 NOTE — Progress Notes (Signed)
 START OFF PATHWAY REGIMEN - Multiple Myeloma and Other Plasma Cell Dyscrasias   [id number]:Bortezomib  1.3 mg/m2 SUBQ D1,4,8,11 + Dexamethasone  40 mg PO D1,4,8,11 q21 Days:   A cycle is every 21 days:     Dexamethasone       Bortezomib    **Always confirm dose/schedule in your pharmacy ordering system**  Patient Characteristics: Multiple Myeloma, Newly Diagnosed, Transplant Eligible, High Risk Disease Classification: Multiple Myeloma Therapeutic Status: Newly Diagnosed R2-ISS Staging: IV Is Patient Eligible for Transplant<= Transplant Eligible Risk Status: High Risk Intent of Therapy: Non-Curative / Palliative Intent, Discussed with Patient

## 2023-07-04 NOTE — Procedures (Signed)
 Vascular and Interventional Radiology Procedure Note  Patient: Frank [name]. DOB: Oct 13, 1965 Medical Record Number: [id number] Note Date/Time: 07/04/23 7:53 AM   Performing Physician: Art Largo, MD Assistant(s): None  Diagnosis: Hx MM. Anemia   Procedure: BONE MARROW ASPIRATION and BIOPSY  Anesthesia: Conscious Sedation Complications: None Estimated Blood Loss: Minimal Specimens: Sent for Pathology  Findings:  Successful CT-guided bone marrow aspiration and biopsy A total of 1 cores were obtained. Hemostasis of the tract was achieved using Manual Pressure.  Plan: Bed rest for 1 hours.  See detailed procedure note with images in PACS. The patient tolerated the procedure well without incident or complication and was returned to Recovery in stable condition.    Art Largo, MD [facility] Interventional Radiology [facility] Radiology   Pager. [phone] Clinic. [phone]

## 2023-07-04 NOTE — Progress Notes (Signed)
    Patient Name: Frank [name].           DOB: 14-Jan-1966  MRN: [id number]      Admission Date: 07/03/2023  Attending Provider: Audria Leather, MD  Primary Diagnosis: Generalized weakness   Level of care: Telemetry    CROSS COVER NOTE   Date of Service   07/04/2023   Antoinette Batman., 58 y.o. male, was admitted on 07/03/2023 for Generalized weakness.    HPI/Events of Note   Agitated, restless, and confused.   Currently removing medical equipment, attempting to get out of bed multiple times.  High fall risk.   Unfortunately, he is not following commands despite multiple attempts at redirection by staff.  Safety sitter has been recommended, however not available at this time.  Haldol  was given at 1930, but has not helped.     Interventions/ Plan   Will trial ativan  for acute agitation.  Restraint use due to patient's level of agitation and combativeness, he is placing himself and staff at risk. Restraint to be discontinued as soon as patient is able to safely have them removed.        Ieesha Abbasi, DNP, ACNPC- AG [facility] [place]

## 2023-07-04 NOTE — Progress Notes (Signed)
 PHARMACY NOTE:  RENAL DOSAGE ADJUSTMENT Renal Function:  Estimated Creatinine Clearance: 24.9 mL/min (A) (by C-G formula based on SCr of 4.1 mg/dL (H)).     Famotidine  40 mg IV q12h ordered for pt on high dose steroids  Plan:  Decrease to famotidine  40 mg IV q24 for CrCl < 30 ml/min  [name] IVAR Edison, Pharm.D Use secure chat for questions 07/04/2023 2:24 PM

## 2023-07-04 NOTE — Progress Notes (Signed)
 I do appreciate Dr. Gearline of Nephrology seeing him.  As always, she has very pertinent recommendations.  Clearly, he has IgG kappa myeloma.  The labs from the hospital in [place]  shows a is over 13 g of IgG.  He got Zometa  last night for the hyper calcium  Mia.  He has a shellfish allergy.  I do not know if this would equate to him not being able to take salmon calcitonin.  He can have a bone marrow biopsy today.  He may be a little bit more alert.  Again I have to get treatment started on him quickly.  I have I will get treatment started on him next week.  As an inpatient, we can certainly do Decadron  and Velcade .  Once he is an outpatient, he will definitely need Faspro or Sarclisa .  We would have to see what his cytogenetics are in the FISH panel.  We will hold off on plasma exchange right now.  Will see what his serum viscosity is.  He has a fracture left arm.  He has the brace on the left arm.  I am unsure if this is a pathologic fracture or if this is a traumatic fracture.  His labs today show his total protein stability over 12 with an albumin  less than 1.5.  His calcium  is 10.5.  His BUN is 34 creatinine 4.1.  His white cell count is 5.3.  Hemoglobin 7.5.  Platelet count 154,000.  Again, we are dealing with a high tumor burden myeloma.  Have to get treatment started.  I suspect that we can get Decadron  going over the weekend.  Then I can consider him for Velcade  to start next week.  At least we get some therapy in him.  Of course, he has diabetes so the Decadron  will certainly jack up his blood sugars.  There is a lot going on right now.  We just really need to get the protein levels down.  I know he will get fantastic care from everybody on 6E.  Jeralyn Crease, MD  John 15:13

## 2023-07-04 NOTE — Progress Notes (Signed)
 PROGRESS NOTE    Frank [name].  FMW:[id number] DOB: 01-07-1966 DOA: 07/03/2023 PCP: [name], MD   Brief Narrative:  58 y.o. male with medical history significant for obesity, asthma, OSA on CPAP, current tobacco user, history of alcohol  abuse (15 years in remission), GERD, hypertension, hyperlipidemia, type 2 diabetes, BPH, seizure disorder, who initially presented at [facility] due to altered mental status.  He was admitted on 06/29/2023 through 07/02/2023.  The patient was admitted for sepsis secondary to community-acquired pneumonia, acute metabolic encephalopathy secondary to sepsis, AKI, hypercalcemia, symptomatic anemia requiring 3 unit PRBCs, and lytic bone lesions with concern for multiple myeloma.     The patient was seen by medical oncology as [facility].  He was in the process of a workup for multiple myeloma.  His family requested transfer to [facility] for continuity of care since most of his providers are from [facility].  He had presented at [facility] with 1 week of generalized weakness.  Associated with hemoglobin of 6.1 (received 3 unit PRBCs which improved to 8.5).  Creatinine on admission 5.84 improved to 5.33.  IgG 13428, IgA 85, IgM less than 8, free kappa 158, free lambda 11.6, free kappa lambda ratio 13.50, M spike pending.   He was transferred to [facility] for oncology evaluation.  Assessment & Plan:   Suspected multiple myeloma -Presented with acute encephalopathy, AKI, hypercalcemia, symptomatic anemia and lytic bone lesions concerning for multiple myeloma - Oncology following: Currently on Decadron .  IR planning for bone marrow biopsy today.  Hypercalcemia - Possibly from above.  Albumin  less than 1.5 today.  Calcium  is 10.5.  Corrected calcium  is 12.5 or more. - Patient received a dose of IV Zometa  on 07/03/2023 as per oncology.  Received a dose of IV Lasix  on 07/03/2023 as per nephrology - Has allergies to shellfish: Unsure if the  patient will be able to receive calcitonin  AKI - Presented with creatinine of 5.84 at outside facility.  Creatinine 4.10 today.  Nephrology following.  IV fluids as per nephrology  Recently diagnosed and treated for community-acquired bacterial pneumonia - Received at least 3 days of IV antibiotics at outside hospital.  Continue Rocephin  and Zithromax  to complete course  Acute metabolic encephalopathy -possibly from all of the above.  Still intermittently confused and agitated.  Continue Haldol  and Ativan  as needed along with restraints if needed  Anemia of chronic disease -presented with hemoglobin of 6.1 at outside hospital.  Transfused 3 units packed red cells.  Hemoglobin 7.5 today.  Monitor.  Transfuse if hemoglobin is less than 7  OSA--continue CPAP at night  Seizure disorder - Does not look like patient is on antiseizure medications at home.  Seizure precautions  Hypoalbuminemia - Severe.  Consult nutrition.  Diabetes mellitus type 2 - Continue CBGs with SSI  Obesity class I - Outpatient follow-up  Tobacco use disorder - Patient was counseled regarding tobacco cessation by admitting hospitalist    DVT prophylaxis: SCDs Code Status: Full Family Communication: None at bedside Disposition Plan: Status is: Inpatient Remains inpatient appropriate because: Of severity of illness    Consultants: Oncology/nephrology  Procedures: None  Antimicrobials: Rocephin  and vancomycin    Subjective: Patient seen and examined at bedside.  Remains intermittently confused and agitated as per nursing staff.  No fever, fever or vomiting reported.  Objective: Vitals:   07/03/23 1433 07/03/23 2051 07/04/23 0542 07/04/23 0754  BP: (!) 154/94 (!) 162/102 (!) 151/86 (!) 159/100  Pulse: 98 100  91 (!) 113  Resp: 18 20 16 20   Temp: 97.9 F (36.6 C) 98.5 F (36.9 C) 98.1 F (36.7 C)   TempSrc: Axillary Oral Oral   SpO2: 94% 92% 100% 94%  Weight:      Height:         Intake/Output Summary (Last 24 hours) at 07/04/2023 0758 Last data filed at 07/03/2023 1622 Gross per 24 hour  Intake 1072.4 ml  Output 600 ml  Net 472.4 ml   Filed Weights   07/03/23 0222 07/03/23 0430  Weight: 110.6 kg 110.6 kg    Examination:  General exam: Appears chronically ill and deconditioned.  On 2 L oxygen via nasal cannula. Respiratory system: Bilateral decreased breath sounds at bases with scattered crackles Cardiovascular system: S1 & S2 heard, Rate controlled Gastrointestinal system: Abdomen is obese, nondistended, soft and nontender. Normal bowel sounds heard. Extremities: No cyanosis, clubbing, edema left upper extremity is in an immobilizer  Central nervous system: Extremely slow to respond.  No focal neurological deficits. Moving extremities Skin: No rashes, lesions or ulcers Psychiatry: Flat affect.  Not agitated.    Data Reviewed: I have personally reviewed following labs and imaging studies  CBC: Recent Labs  Lab 07/03/23 0450 07/04/23 0547  WBC 4.3 5.3  NEUTROABS 1.4* 1.2*  HGB 7.2* 7.5*  HCT 24.6* 26.1*  MCV 92.8 93.5  PLT 154 154   Basic Metabolic Panel: Recent Labs  Lab 07/03/23 0505 07/04/23 0547  NA 135 138  K 3.6 3.5  CL 112* 113*  CO2 20* 22  GLUCOSE 104* 87  BUN 34* 34*  CREATININE 4.32* 4.10*  CALCIUM  9.9 10.5*  MG 1.8 1.6*  PHOS 3.8  --    GFR: Estimated Creatinine Clearance: 24.9 mL/min (A) (by C-G formula based on SCr of 4.1 mg/dL (H)). Liver Function Tests: Recent Labs  Lab 07/03/23 0505 07/04/23 0547  AST 14* 12*  ALT <5 8  ALKPHOS 31* 31*  BILITOT 0.5 0.5  PROT >12.0* >12.0*  ALBUMIN  <1.5* <1.5*   No results for input(s): LIPASE, AMYLASE in the last 168 hours. No results for input(s): AMMONIA in the last 168 hours. Coagulation Profile: No results for input(s): INR, PROTIME in the last 168 hours. Cardiac Enzymes: No results for input(s): CKTOTAL, CKMB, CKMBINDEX, TROPONINI in the last  168 hours. BNP (last 3 results) No results for input(s): PROBNP in the last 8760 hours. HbA1C: No results for input(s): HGBA1C in the last 72 hours. CBG: Recent Labs  Lab 07/03/23 0747 07/03/23 1127 07/03/23 1643 07/03/23 2045 07/04/23 0721  GLUCAP 97 91 130* 108* 74   Lipid Profile: No results for input(s): CHOL, HDL, LDLCALC, TRIG, CHOLHDL, LDLDIRECT in the last 72 hours. Thyroid Function Tests: No results for input(s): TSH, T4TOTAL, FREET4, T3FREE, THYROIDAB in the last 72 hours. Anemia Panel: No results for input(s): VITAMINB12, FOLATE, FERRITIN, TIBC, IRON, RETICCTPCT in the last 72 hours. Sepsis Labs: Recent Labs  Lab 07/03/23 0450  PROCALCITON 0.17    No results found for this or any previous visit (from the past 240 hours).       Radiology Studies: No results found.      Scheduled Meds:  azithromycin   250 mg Oral Daily   dexamethasone  (DECADRON ) injection  40 mg Intravenous Q24H   insulin  aspart  0-5 Units Subcutaneous QHS   insulin  aspart  0-9 Units Subcutaneous TID WC   Continuous Infusions:  cefTRIAXone  (ROCEPHIN )  IV 1 g (07/04/23 0554)   famotidine  (PEPCID ) IV  fluconazole  (DIFLUCAN ) IV     lactated ringers  125 mL/hr at 07/03/23 1927          Sophie Mao, MD [facility] 07/04/2023, 7:58 AM

## 2023-07-05 ENCOUNTER — Inpatient Hospital Stay (HOSPITAL_COMMUNITY)

## 2023-07-05 DIAGNOSIS — D638 Anemia in other chronic diseases classified elsewhere: Secondary | ICD-10-CM

## 2023-07-05 DIAGNOSIS — N179 Acute kidney failure, unspecified: Secondary | ICD-10-CM

## 2023-07-05 DIAGNOSIS — C9000 Multiple myeloma not having achieved remission: Secondary | ICD-10-CM | POA: Diagnosis not present

## 2023-07-05 DIAGNOSIS — R531 Weakness: Secondary | ICD-10-CM | POA: Diagnosis not present

## 2023-07-05 DIAGNOSIS — G9340 Encephalopathy, unspecified: Secondary | ICD-10-CM | POA: Diagnosis not present

## 2023-07-05 DIAGNOSIS — E46 Unspecified protein-calorie malnutrition: Secondary | ICD-10-CM

## 2023-07-05 LAB — IGG, IGA, IGM
IgA: 74 mg/dL — ABNORMAL LOW (ref 90–386)
IgG (Immunoglobin G), Serum: 11507 mg/dL — ABNORMAL HIGH (ref 603–1613)
IgM (Immunoglobulin M), Srm: 5 mg/dL — ABNORMAL LOW (ref 20–172)

## 2023-07-05 LAB — CBC WITH DIFFERENTIAL/PLATELET
Abs Immature Granulocytes: 0.3 10*3/uL — ABNORMAL HIGH (ref 0.00–0.07)
Basophils Absolute: 0 10*3/uL (ref 0.0–0.1)
Basophils Relative: 0 %
Blasts: 25 %
Eosinophils Absolute: 0 10*3/uL (ref 0.0–0.5)
Eosinophils Relative: 0 %
HCT: 24.9 % — ABNORMAL LOW (ref 39.0–52.0)
Hemoglobin: 7.2 g/dL — ABNORMAL LOW (ref 13.0–17.0)
Lymphocytes Relative: 10 %
Lymphs Abs: 0.9 10*3/uL (ref 0.7–4.0)
MCH: 27.1 pg (ref 26.0–34.0)
MCHC: 28.9 g/dL — ABNORMAL LOW (ref 30.0–36.0)
MCV: 93.6 fL (ref 80.0–100.0)
Metamyelocytes Relative: 1 %
Monocytes Absolute: 0.7 10*3/uL (ref 0.1–1.0)
Monocytes Relative: 8 %
Myelocytes: 3 %
Neutro Abs: 4.6 10*3/uL (ref 1.7–7.7)
Neutrophils Relative %: 53 %
Platelets: 148 10*3/uL — ABNORMAL LOW (ref 150–400)
RBC: 2.66 MIL/uL — ABNORMAL LOW (ref 4.22–5.81)
RDW: 26.9 % — ABNORMAL HIGH (ref 11.5–15.5)
WBC: 8.6 10*3/uL (ref 4.0–10.5)
nRBC: 1 % — ABNORMAL HIGH (ref 0.0–0.2)

## 2023-07-05 LAB — GLUCOSE, CAPILLARY
Glucose-Capillary: 118 mg/dL — ABNORMAL HIGH (ref 70–99)
Glucose-Capillary: 123 mg/dL — ABNORMAL HIGH (ref 70–99)
Glucose-Capillary: 158 mg/dL — ABNORMAL HIGH (ref 70–99)
Glucose-Capillary: 167 mg/dL — ABNORMAL HIGH (ref 70–99)

## 2023-07-05 LAB — URINALYSIS, ROUTINE W REFLEX MICROSCOPIC
Bilirubin Urine: NEGATIVE
Glucose, UA: NEGATIVE mg/dL
Ketones, ur: NEGATIVE mg/dL
Leukocytes,Ua: NEGATIVE
Nitrite: NEGATIVE
Protein, ur: 30 mg/dL — AB
Specific Gravity, Urine: 1.008 (ref 1.005–1.030)
pH: 7 (ref 5.0–8.0)

## 2023-07-05 LAB — COMPREHENSIVE METABOLIC PANEL WITH GFR
ALT: 12 U/L (ref 0–44)
AST: 30 U/L (ref 15–41)
Albumin: <1.5 g/dL — ABNORMAL LOW (ref 3.5–5.0)
Alkaline Phosphatase: 34 U/L — ABNORMAL LOW (ref 38–126)
Anion gap: 6 (ref 5–15)
BUN: 40 mg/dL — ABNORMAL HIGH (ref 6–20)
CO2: 21 mmol/L — ABNORMAL LOW (ref 22–32)
Calcium: 9.3 mg/dL (ref 8.9–10.3)
Chloride: 114 mmol/L — ABNORMAL HIGH (ref 98–111)
Creatinine, Ser: 4.34 mg/dL — ABNORMAL HIGH (ref 0.61–1.24)
GFR, Estimated: 15 mL/min — ABNORMAL LOW (ref >60)
Glucose, Bld: 130 mg/dL — ABNORMAL HIGH (ref 70–99)
Potassium: 3.5 mmol/L (ref 3.5–5.1)
Sodium: 141 mmol/L (ref 135–145)
Total Bilirubin: 0.7 mg/dL (ref 0.0–1.2)
Total Protein: >12 g/dL — ABNORMAL HIGH (ref 6.5–8.1)

## 2023-07-05 LAB — MAGNESIUM: Magnesium: 1.4 mg/dL — ABNORMAL LOW (ref 1.7–2.4)

## 2023-07-05 LAB — AMMONIA: Ammonia: 81 umol/L — ABNORMAL HIGH (ref 9–35)

## 2023-07-05 LAB — PROTIME-INR
INR: 2.1 — ABNORMAL HIGH (ref 0.8–1.2)
Prothrombin Time: 23.3 s — ABNORMAL HIGH (ref 11.4–15.2)

## 2023-07-05 LAB — MRSA NEXT GEN BY PCR, NASAL: MRSA by PCR Next Gen: NOT DETECTED

## 2023-07-05 MED ORDER — ORAL CARE MOUTH RINSE: 15.0000 mL | OROMUCOSAL | Status: DC | PRN

## 2023-07-05 MED ORDER — ACETAMINOPHEN 10 MG/ML IV SOLN
1000.0000 mg | Freq: Once | INTRAVENOUS | Status: AC
  Administered 2023-07-05: 1000 mg via INTRAVENOUS
  Filled 2023-07-05: qty 100

## 2023-07-05 MED ORDER — SODIUM CHLORIDE 0.9 % IV SOLN
500.0000 mg | Freq: Every day | INTRAVENOUS | Status: AC
  Administered 2023-07-05 – 2023-07-06 (×2): 500 mg via INTRAVENOUS
  Filled 2023-07-05 (×2): qty 5

## 2023-07-05 MED ORDER — FUROSEMIDE 10 MG/ML IJ SOLN
80.0000 mg | Freq: Three times a day (TID) | INTRAMUSCULAR | Status: DC
  Administered 2023-07-05 (×2): 80 mg via INTRAVENOUS
  Filled 2023-07-05 (×2): qty 8

## 2023-07-05 MED ORDER — MAGNESIUM SULFATE 2 GM/50ML IV SOLN
2.0000 g | Freq: Once | INTRAVENOUS | Status: AC
  Administered 2023-07-05: 2 g via INTRAVENOUS
  Filled 2023-07-05: qty 50

## 2023-07-05 MED ORDER — LORAZEPAM 2 MG/ML IJ SOLN
1.0000 mg | Freq: Once | INTRAMUSCULAR | Status: AC
  Administered 2023-07-05: 1 mg via INTRAMUSCULAR
  Filled 2023-07-05: qty 1

## 2023-07-05 MED ORDER — LABETALOL HCL 5 MG/ML IV SOLN
10.0000 mg | INTRAVENOUS | Status: DC | PRN
  Administered 2023-07-05: 10 mg via INTRAVENOUS
  Filled 2023-07-05: qty 4

## 2023-07-05 MED ORDER — ORAL CARE MOUTH RINSE
15.0000 mL | OROMUCOSAL | Status: DC
  Administered 2023-07-05 – 2023-07-08 (×13): 15 mL via OROMUCOSAL

## 2023-07-05 MED ORDER — HYDRALAZINE HCL 20 MG/ML IJ SOLN
20.0000 mg | INTRAMUSCULAR | Status: DC | PRN
  Administered 2023-07-05 (×2): 20 mg via INTRAVENOUS
  Filled 2023-07-05 (×2): qty 1

## 2023-07-05 MED ORDER — FUROSEMIDE 10 MG/ML IJ SOLN
80.0000 mg | Freq: Two times a day (BID) | INTRAMUSCULAR | Status: DC
  Administered 2023-07-06: 80 mg via INTRAVENOUS
  Filled 2023-07-05: qty 8

## 2023-07-05 MED ORDER — LABETALOL HCL 5 MG/ML IV SOLN
20.0000 mg | INTRAVENOUS | Status: DC | PRN
  Administered 2023-07-05: 20 mg via INTRAVENOUS
  Filled 2023-07-05 (×2): qty 4

## 2023-07-05 MED ORDER — ACETAMINOPHEN 650 MG RE SUPP
650.0000 mg | RECTAL | Status: DC | PRN
Start: 2023-07-05 — End: 2023-07-07
  Administered 2023-07-05 (×2): 650 mg via RECTAL
  Filled 2023-07-05 (×2): qty 1

## 2023-07-05 MED ORDER — DEXTROSE 5 % IV SOLN: 250.0000 mg | INTRAVENOUS | Status: DC

## 2023-07-05 NOTE — Progress Notes (Signed)
 Multiple issues tonight. Daily po Zithromax  was not given due to documentation of delirium/ AMS, patient refused. Discussed with on call provider for IV option. High temps of 102.1, cooling with cold packs.  High BPs non reliable due to continues movement of patient and incorrect cuff location: cuff adjusted . Discussed with NP. Will try to obtain most reliable BP.  Skin assessment corrected , no heel un-stageable wound noted . Patient does have bilateral heel calluses. No actual open area.

## 2023-07-05 NOTE — Plan of Care (Signed)
  Problem: Pain Managment: Goal: General experience of comfort will improve and/or be controlled Outcome: Progressing   Problem: Metabolic: Goal: Ability to maintain appropriate glucose levels will improve Outcome: Progressing   Problem: Skin Integrity: Goal: Risk for impaired skin integrity will decrease Outcome: Progressing   Problem: Tissue Perfusion: Goal: Adequacy of tissue perfusion will improve Outcome: Progressing   Problem: Safety: Goal: Ability to remain free from injury will improve Outcome: Not Progressing   Problem: Nutritional: Goal: Maintenance of adequate nutrition will improve Outcome: Not Progressing Note: Patient is not alert enough for PO intake   Problem: Safety: Goal: Non-violent Restraint(s) Outcome: Not Progressing Note: Patient still needs restraints

## 2023-07-05 NOTE — Progress Notes (Signed)
 [facility] Progress Note  Subjective:  450 cc UOP yesterday CXR last night showed pulm edema Dc'd IVF's and started IV lasix  today UOP today so far 3.6 L  Vitals:   07/04/23 0810 07/04/23 0833 07/04/23 0952 07/04/23 1349  BP: (!) 209/101 (!) 155/94 (!) 137/99 (!) 168/108  Pulse: (!) 120 (!) 106 98 (!) 106  Resp: (!) 21 20  20   Temp:    98.2 F (36.8 C)  TempSrc:    Oral  SpO2: 98% 95% 100% (!) 89%  Weight:      Height:        Exam: Gen lethargic, in restraints, sleeping, doesn't answer question, falls back asleep No jvd or bruits Chest clear bilat to bases RRR no MRG Abd soft ntnd no mass or ascites +bs GU condom cath in place, urine clear yellow Ext no  LE or UE edema, no other edema Neuro is lethargic, no myoclonus or asterixis      Renal-related home meds: Celecoxib  200 every day Lasix  40 every day prn Hydrochlorothiazide  25 every day Hydralazine  10 bid Lisinopril  40 every day  Aldactone  25 hs Verapamil  SR 120 every day Others: wellbutrin xl, flomax , paxil , Oxy IR, singulair , metformin , gabapentin , advair, nexium, depakote , klonopin, buspar , aspirin , albuterol  prn    Date                             Creat               eGFR (ml/min) 2019                            1.00- 1.09 2023                            0.91- 0.93 2024                            1.11- 1.52        53 - >60 ml/min 06/30/23                        5.80                 At OSH 4/17                             4.32                 At [facility] 07/04/23                        4.10   Na 138  K 3.5  CO2 22 BUN 34  creat 4.10  alb < 1.5   LDH 125 CRP < 0.5   Hb 7.5  plt 154  WBC 5 K  vit D 25-hydroxy - 17 BP's high to wnl 140-180/90- 100, HR 98-120, RR 18    Assessment/ Plan: AKi on CKD 3a - b/l creat 1.1- 1.5 from 2024, eGFR 53- >60 ml/min. Creat here 5.8 on presentation at OSH and down to 4.10 here today 4 days later. Making urine. Bp is high, looks dry on exam. AKI due to possible myeloma  kidney +/- hyperCa +/- ACEi (home med. CXR yest showed pulm edema -->  dc'd IVF"s and starting IV lasix  today. Good response so far. Should move him to ICU/SDU.  HTN - uncontrolled. Was on 3 BP meds and 2 diuretics at home. Not able to take pills here. Started catapres  patch yest and prn IV 10mg  hydralazine  q 4h.  Volume - no LE edema but CXR +pulm edema. IV lasix  as above Hypercalcemia - looks resolved for now. Possible myeloma, w/u in progress, oncology following. SP zometa  here x 1, also getting calcitonin.  Bipolar d/o    Larry Poag MD  [facility] 07/05/2023, 5:32 AM  Recent Labs  Lab 07/03/23 0450 07/03/23 0505 07/04/23 0547  HGB 7.2*  --  7.5*  ALBUMIN   --  <1.5* <1.5*  CALCIUM   --  9.9 10.5*  PHOS  --  3.8  --   CREATININE  --  4.32* 4.10*  K  --  3.6 3.5   No results for input(s): "IRON", "TIBC", "FERRITIN" in the last 168 hours. Inpatient medications:  azithromycin   250 mg Oral Daily   calcitonin  400 Units Subcutaneous BID   cloNIDine   0.2 mg Transdermal Weekly   dexamethasone  (DECADRON ) injection  40 mg Intravenous Q24H   insulin  aspart  0-5 Units Subcutaneous QHS   insulin  aspart  0-9 Units Subcutaneous TID WC    sodium chloride  Stopped (07/05/23 0304)   cefTRIAXone  (ROCEPHIN )  IV 200 mL/hr at 07/04/23 0555   famotidine  (PEPCID ) IV     fluconazole  (DIFLUCAN ) IV Stopped (07/04/23 1125)   acetaminophen , haloperidol  lactate, hydrALAZINE , LORazepam , melatonin, mouth rinse, oxyCODONE , polyethylene glycol, prochlorperazine 

## 2023-07-05 NOTE — Progress Notes (Signed)
   07/05/23 0239  BiPAP/CPAP/SIPAP  BiPAP/CPAP/SIPAP Pt Type Adult  Reason BIPAP/CPAP not in use Other(comment) (patient is confused with mittens on)

## 2023-07-05 NOTE — Progress Notes (Signed)
     Patient Name: Frank [name].           DOB: 03/25/1965  MRN: [id number]      Admission Date: 07/03/2023  Attending Provider: Audria Leather, MD  Primary Diagnosis: Generalized weakness   Level of care: Stepdown   OVERNIGHT PROGRESS REPORT   Patient refused PO azithromycin  earlier today. Abx has been changed to IV form to complete course for CAP.  UA negative. Chest xray with mild pulmonary congestion. BC pending. Continue management of fever with tylenol .      Sindee Stucker, DNP, ACNPC- AG [facility] [place]

## 2023-07-05 NOTE — Progress Notes (Signed)
 Received Pt from Night RN , Confused and on yellow MEWS, this RN reassessed Vital signs which were BP 210/135, Pulse 65, Resp 28, SPO2 on 2L 85. Dr Marsh Skeans informed, Hydralazine  administered, Vitals reassessed , BP 163/130, Pulse 128 ,Resp 28 SPO2 95 on 3 L of O2. The Dr made aware, Patient transferred to ICU. Report given to [place], [place]

## 2023-07-05 NOTE — Progress Notes (Signed)
 PROGRESS NOTE    Frank [name].  ZOX:[id number] DOB: 02-19-1966 DOA: 07/03/2023 PCP: Serita Danes, MD   Brief Narrative:  58 y.o. 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  He was admitted on 06/29/2023 through 07/02/2023.  The patient was admitted for sepsis secondary to community-acquired pneumonia, acute metabolic encephalopathy secondary to sepsis, AKI, hypercalcemia, symptomatic anemia requiring 3 unit PRBCs, and lytic bone lesions with concern for multiple myeloma.     The patient was seen by medical oncology as [facility].  He was in the process of a workup for multiple myeloma.  His family requested transfer to [facility] health for continuity of care since most of his providers are from [facility].  He had presented at [facility] with 1 week of generalized weakness.  Associated with hemoglobin of 6.1 (received 3 unit PRBCs which improved to 8.5).  Creatinine on admission 5.84 improved to 5.33.  IgG 13428, IgA 85, IgM less than 8, free kappa 158, free lambda 11.6, free kappa lambda ratio 13.50, M spike pending.   He was transferred to [facility] for oncology evaluation.  Nephrology was also consulted.  Assessment & Plan:   Suspected multiple myeloma -Presented with acute encephalopathy, AKI, hypercalcemia, symptomatic anemia and lytic bone lesions concerning for multiple myeloma - Oncology following: Currently on Decadron .  Status post bone marrow biopsy by IR on 07/04/2023.  Hypercalcemia - Possibly from above.  Calcium  level pending for today. - Patient received a dose of IV Zometa  on 07/03/2023 as per oncology.  Patient apparently received calcitonin at [facility].  He has been put on calcitonin here as well by  oncology. -Currently also on scheduled Lasix  as per nephrology.  AKI - Presented with creatinine of 5.84 at outside facility.  Creatinine 4.10 on 07/04/2023.  Pending today.  Nephrology following.  IV fluids discontinued by nephrology.  Recently diagnosed and treated for community-acquired bacterial pneumonia - Received at least 3 days of IV antibiotics at outside hospital.  Continue Rocephin  and Zithromax  to complete course  Acute metabolic encephalopathy -possibly from all of the above.  Still intermittently confused and agitated.  Continue Haldol  and Ativan  as needed along with restraints if needed - Will consult PCCM as well.  Anemia of chronic disease -presented with hemoglobin of 6.1 at outside hospital.  Transfused 3 units packed red cells.  Hemoglobin 7.5 on 07/04/2023.  Pending today.  Monitor.  Transfuse if hemoglobin is less than 7  OSA--continue CPAP at night  Seizure disorder - Does not look like patient is on antiseizure medications at home.  Seizure precautions  Hypoalbuminemia - Severe.  Consult nutrition.  Diabetes mellitus type 2 - Continue CBGs with SSI  Obesity class I - Outpatient follow-up  Tobacco use disorder - Patient was counseled regarding tobacco cessation by admitting hospitalist    DVT prophylaxis: SCDs Code Status: Full Family Communication: None at bedside Disposition Plan: Status is: Inpatient Remains inpatient appropriate because: Of severity of illness    Consultants: Oncology/nephrology  Procedures: None  Antimicrobials: Rocephin  and vancomycin    Subjective: Patient seen and examined at bedside.  Continues to have intermittent agitation requiring IV Haldol .  No seizures, vomiting, fever reported. Objective: Vitals:   07/04/23 0810 07/04/23 0833 07/04/23 0952 07/04/23 1349  BP: (!) 209/101 (!) 155/94 Frank [name])  137/99 (!) 168/108  Pulse: (!) 120 (!) 106 98 (!) 106  Resp: (!) 21 20  20   Temp:    98.2 F (36.8 C)  TempSrc:    Oral   SpO2: 98% 95% 100% (!) 89%  Weight:      Height:        Intake/Output Summary (Last 24 hours) at 07/05/2023 0812 Last data filed at 07/05/2023 0727 Gross per 24 hour  Intake 2384.5 ml  Output 2700 ml  Net -315.5 ml   Filed Weights   07/03/23 0222 07/03/23 0430  Weight: 110.6 kg 110.6 kg    Examination:  General: Currently on 2 L oxygen by nasal cannula.  No distress.  Chronically ill and deconditioned looking. ENT/neck: No thyromegaly.  JVD is not elevated  respiratory: Decreased breath sounds at bases bilaterally with some crackles; no wheezing and intermittent tachypnea  CVS: S1-S2 heard, tachycardic Abdominal: Soft, obese, nontender, slightly distended; no organomegaly, bowel sounds are heard Extremities: Trace lower extremity edema; no cyanosis.  Left upper extremity is in an immobilizer CNS: Remains extremely slow to respond.  No focal neurologic deficit.  Moves extremities Lymph: No obvious lymphadenopathy Skin: No obvious ecchymosis/lesions  psych: Extremely flat affect.  Not agitated currently.  Musculoskeletal: No obvious other joint swelling/deformity     Data Reviewed: I have personally reviewed following labs and imaging studies  CBC: Recent Labs  Lab 07/03/23 0450 07/04/23 0547  WBC 4.3 5.3  NEUTROABS 1.4* 1.2*  HGB 7.2* 7.5*  HCT 24.6* 26.1*  MCV 92.8 93.5  PLT 154 154   Basic Metabolic Panel: Recent Labs  Lab 07/03/23 0505 07/04/23 0547  NA 135 138  K 3.6 3.5  CL 112* 113*  CO2 20* 22  GLUCOSE 104* 87  BUN 34* 34*  CREATININE 4.32* 4.10*  CALCIUM  9.9 10.5*  MG 1.8 1.6*  PHOS 3.8  --    GFR: Estimated Creatinine Clearance: 24.9 mL/min (A) (by C-G formula based on SCr of 4.1 mg/dL (H)). Liver Function Tests: Recent Labs  Lab 07/03/23 0505 07/04/23 0547  AST 14* 12*  ALT <5 8  ALKPHOS 31* 31*  BILITOT 0.5 0.5  PROT >12.0* >12.0*  ALBUMIN  <1.5* <1.5*   No results for input(s): "LIPASE", "AMYLASE" in the last 168 hours. No  results for input(s): "AMMONIA" in the last 168 hours. Coagulation Profile: No results for input(s): "INR", "PROTIME" in the last 168 hours. Cardiac Enzymes: No results for input(s): "CKTOTAL", "CKMB", "CKMBINDEX", "TROPONINI" in the last 168 hours. BNP (last 3 results) No results for input(s): "PROBNP" in the last 8760 hours. HbA1C: No results for input(s): "HGBA1C" in the last 72 hours. CBG: Recent Labs  Lab 07/04/23 0721 07/04/23 1135 07/04/23 1619 07/04/23 2124 07/05/23 0727  GLUCAP 74 106* 163* 143* 118*   Lipid Profile: No results for input(s): "CHOL", "HDL", "LDLCALC", "TRIG", "CHOLHDL", "LDLDIRECT" in the last 72 hours. Thyroid Function Tests: No results for input(s): "TSH", "T4TOTAL", "FREET4", "T3FREE", "THYROIDAB" in the last 72 hours. Anemia Panel: No results for input(s): "VITAMINB12", "FOLATE", "FERRITIN", "TIBC", "IRON", "RETICCTPCT" in the last 72 hours. Sepsis Labs: Recent Labs  Lab 07/03/23 0450  PROCALCITON 0.17    No results found for this or any previous visit (from the past 240 hours).       Radiology Studies: DG CHEST PORT 1 VIEW Result Date: 07/04/2023 CLINICAL DATA:  Hypertension and dyspnea, generalized weakness, AKI EXAM: PORTABLE CHEST 1 VIEW COMPARISON:  None are available. There is a report from radiographs 12/14/2018  FINDINGS: Borderline cardiomegaly. Diffuse interstitial coarsening and airspace opacities bilaterally. No pleural effusion or pneumothorax. No displaced rib fractures. Advanced arthritis both shoulders. IMPRESSION: Diffuse interstitial coarsening and airspace opacities bilaterally, likely edema. Atypical infection could appear similarly. Electronically Signed   By: Rozell Cornet M.D.   On: 07/04/2023 22:21   CT BONE MARROW BIOPSY & ASPIRATION Result Date: 07/04/2023 INDICATION: [id number] Myeloma (HCC) [id number] EXAM: CT GUIDED BONE MARROW ASPIRATION AND CORE BIOPSY MEDICATIONS: None. ANESTHESIA/SEDATION: Moderate (conscious) sedation  was employed during this procedure. A total of Versed  2 mg and Fentanyl  50 mcg was administered intravenously. Moderate Sedation Time: 16 minutes. The patient's level of consciousness and vital signs were monitored continuously by radiology nursing throughout the procedure under my direct supervision. FLUOROSCOPY TIME:  CT dose; 314 mGycm COMPLICATIONS: None immediate. Estimated blood loss: <5 mL PROCEDURE: RADIATION DOSE REDUCTION: This exam was performed according to the departmental dose-optimization program which includes automated exposure control, adjustment of the mA and/or kV according to patient size and/or use of iterative reconstruction technique. Informed written consent was obtained from the patient and/or patient's representative after a thorough discussion of the procedural risks, benefits and alternatives. All questions were addressed. Maximal Sterile Barrier Technique was utilized including caps, mask, sterile gowns, sterile gloves, sterile drape, hand hygiene and skin antiseptic. A timeout was performed prior to the initiation of the procedure. The patient was positioned prone and non-contrast localization CT was performed of the pelvis to demonstrate the iliac marrow spaces. Under sterile conditions and local anesthesia, an 11 gauge coaxial bone biopsy needle was advanced into the RIGHT iliac marrow space. Needle position was confirmed with CT imaging. Initially, bone marrow aspiration was performed. Next, the 11 gauge outer cannula was utilized to obtain a 1 iliac bone marrow core biopsy. Needle was removed. Hemostasis was obtained with compression. The patient tolerated the procedure well. Samples were prepared with the cytotechnologist. IMPRESSION: Successful CT-guided bone marrow aspiration and biopsy. Art Largo, MD [facility] Electronically Signed   By: Art Largo M.D.   On: 07/04/2023 12:21        Scheduled Meds:   azithromycin   250 mg Oral Daily   calcitonin  400 Units Subcutaneous BID   cloNIDine   0.2 mg Transdermal Weekly   dexamethasone  (DECADRON ) injection  40 mg Intravenous Q24H   furosemide   80 mg Intravenous Q8H   insulin  aspart  0-5 Units Subcutaneous QHS   insulin  aspart  0-9 Units Subcutaneous TID WC   Continuous Infusions:  cefTRIAXone  (ROCEPHIN )  IV 1 g (07/05/23 0554)   famotidine  (PEPCID ) IV     fluconazole  (DIFLUCAN ) IV Stopped (07/04/23 1125)          Audria Leather, MD [facility] 07/05/2023, 8:12 AM

## 2023-07-05 NOTE — Progress Notes (Signed)
 Frank [name] is still somewhat disoriented.  He has no labs back yet today.  He is on steroids.  We will try to start him on Velcade  on Monday.  He had a bone marrow biopsy yesterday.  We really need to see what the labs look like.  We need to see what the hypercalcemia is.  He can get calcitonin.  I think this will help with the hypercalcemia.  I checked for the serum viscosity.  This is still not back.  I think this is going to be quite helpful as well not regarding need to do any type of plasma exchange.  Is really not able to eat.  He is not able to swallow because of the disorientation.  He is quite large so I do not think this is much of a problem right now.  Again, this is myeloma.  We really need to see how extensive the bone marrow is involved.  I would have to believe that it is extensively involved.  Again the labs are can be critical.  I hope that we do not have to transfuse him.  I am sure that we could utilize ESA.  I am sure his EPO level is quite low.  We are doing a 24-hour urine on him.  I do appreciate Nephrology seeing him.  Thankfully, his renal function is not worsening.  We still have a lot of work to do.  I did talk to his wife yesterday.  She gave me a lot of good information.  He apparently broke the left arm I think couple years ago.  So this is not anything related to the myeloma.  I do appreciate the great work that the staff on [address] are doing on him.  This is incredibly complicated.  Hopefully, once we can start getting treatment into him, he will improve.  I may want to also add Cytoxan  to his chemotherapy protocol.  I do not think we can utilize Revlimid because of his renal insufficiency.  As an outpatient, we can utilize either Faspro or Darzalex.  [name], MD  [name] 28:6

## 2023-07-05 NOTE — Consult Note (Signed)
 NAME:  Frank Riel., MRN:  [id number], DOB:  Sep 21, 1965, LOS: 2 ADMISSION DATE:  07/03/2023, CONSULTATION DATE: 07/05/2023 REFERRING MD: Dr. Maury Space, CHIEF COMPLAINT: Encephalopathy  History of Present Illness:  Asked to see patient for encephalopathy  Transferred from [facility] with 1 week of generalized weakness, altered mentation, decreased appetite Recently being worked up for multiple myeloma.  Transferred to [facility] for oncology evaluation Nephrology consulted for AKI  Background history of obesity, asthma, obstructive sleep apnea on CPAP GERD, hypertension, hyperlipidemia Has been feeling weak for the last few days according to spouse Admitted with concern for sepsis, community-acquired pneumonia, antibiotic encephalopathy Workup revealed lytic bony lesions with concern for multiple myeloma Transfused with 3 units packed red blood cells Evaluation so far-bone marrow 07/04/2023  Pertinent  Medical History   Past Medical History:  Diagnosis Date   Anxiety    Arthritis    Asthma    Bipolar disorder (HCC)    Current every day smoker    Depression    Diabetes mellitus, type II (HCC)    Dyspnea    History of kidney stones    History of pneumonia    Hyperlipidemia    Hypertension    Morbid obesity (HCC)    Sedentary lifestyle    Seizures (HCC)    last seizure 12 yrs ago, no current problem   Sleep apnea    uses CPAP nightly    Significant Hospital Events: Including procedures, antibiotic start and stop dates in addition to other pertinent events   4/19-blood cultures, MRSA PCR negative 4/19-chest x-ray with mild pulmonary congestion  Interim History / Subjective:  Middle-age, does not appear to be an extremis Arouses to name calling but will not follow commands On oxygen supplementation  Objective   Blood pressure (!) 164/97, pulse (!) 109, temperature 98.3 F (36.8 C), temperature source Oral, resp. rate (!) 27, height 6\' 2"  (1.88 m), weight 107.3 kg, SpO2  95%.        Intake/Output Summary (Last 24 hours) at 07/05/2023 1557 Last data filed at 07/05/2023 1349 Gross per 24 hour  Intake 2529.8 ml  Output 3900 ml  Net -1370.2 ml   Filed Weights   07/03/23 0222 07/03/23 0430 07/05/23 1132  Weight: 110.6 kg 110.6 kg 107.3 kg  Examination: General: Middle-age, does not appear to be in distress HENT: Dry oral mucosa Lungs: Decreased air movement with some rales at the bases Cardiovascular: S1-S2 appreciated Abdomen: Soft, bowel sounds appreciated Extremities: No clubbing, no edema Neuro: Lethargy GU: Fair output  I reviewed nursing notes, Consultant notes, hospitalist notes, last 24 h vitals and pain scores, last 48 h intake and output, last 24 h labs and trends, and last 24 h imaging results. - Creatinine of 5.84 -Calcium   Resolved Hospital Problem list     Assessment & Plan:  Encephalopathy - Likely multifactorial -Uremic, noted to have elevated ammonia levels-levels pending  Community-acquired pneumonia - Started on antibiotics Rocephin  and azithromycin  - This will be continued to complete course  Hypercalcemia - On Zometa , did receive calcitonin-this has been continued - On scheduled Lasix   AKI- -renal service following  Multiple myeloma - Bone marrow biopsy performed 07/04/2023 - Elevated immunoglobulins  Anemia of chronic disease - May be related to his multiple myeloma  History of OSA - Continue CPAP at night  History of seizures - Seizure precautions - On Depakote   Protein calorie malnutrition - place cortrak - Should help with nutrition and also medication  Uncontrolled hypertension -  On as needed labetalol  and Apresoline  - On clonidine  - Will be able to start p.o. medication with cortrak in place - His med list does not include verapamil , lisinopril , hydrochlorothiazide   Tobacco use disorder - Nicotine  patch  Best Practice (right click and "Reselect all SmartList Selections" daily)   Diet/type:  NPO DVT prophylaxis SCD Pressure ulcer(s): N/A GI prophylaxis: N/A Lines: N/A Foley:  Yes, and it is still needed Code Status:  full code Last date of multidisciplinary goals of care discussion [discussed with spouse at bedside]  Labs   CBC: Recent Labs  Lab 07/03/23 0450 07/04/23 0547 07/05/23 0750  WBC 4.3 5.3 8.6  NEUTROABS 1.4* 1.2* 4.6  HGB 7.2* 7.5* 7.2*  HCT 24.6* 26.1* 24.9*  MCV 92.8 93.5 93.6  PLT 154 154 148*    Basic Metabolic Panel: Recent Labs  Lab 07/03/23 0505 07/04/23 0547 07/05/23 0750  NA 135 138 141  K 3.6 3.5 3.5  CL 112* 113* 114*  CO2 20* 22 21*  GLUCOSE 104* 87 130*  BUN 34* 34* 40*  CREATININE 4.32* 4.10* 4.34*  CALCIUM  9.9 10.5* 9.3  MG 1.8 1.6* 1.4*  PHOS 3.8  --   --    GFR: Estimated Creatinine Clearance: 23.9 mL/min (A) (by C-G formula based on SCr of 4.34 mg/dL (H)). Recent Labs  Lab 07/03/23 0450 07/04/23 0547 07/05/23 0750  PROCALCITON 0.17  --   --   WBC 4.3 5.3 8.6    Liver Function Tests: Recent Labs  Lab 07/03/23 0505 07/04/23 0547 07/05/23 0750  AST 14* 12* 30  ALT 5 8 12   ALKPHOS 31* 31* 34*  BILITOT 0.5 0.5 0.7  PROT >12.0* >12.0* >12.0*  ALBUMIN  <1.5* <1.5* <1.5*   No results for input(s): "LIPASE", "AMYLASE" in the last 168 hours. No results for input(s): "AMMONIA" in the last 168 hours.  ABG    Component Value Date/Time   HCO3 26.1 01/29/2023 1044   TCO2 28 01/29/2023 1044   O2SAT 93 01/29/2023 1044     Coagulation Profile: No results for input(s): "INR", "PROTIME" in the last 168 hours.  Cardiac Enzymes: No results for input(s): "CKTOTAL", "CKMB", "CKMBINDEX", "TROPONINI" in the last 168 hours.  HbA1C: Hgb A1c MFr Bld  Date/Time Value Ref Range Status  01/29/2023 02:40 PM 6.7 (H) 4.8 - 5.6 % Final    Comment:    (NOTE) Pre diabetes:          5.7%-6.4%  Diabetes:              >6.4%  Glycemic control for   <7.0% adults with diabetes   11/12/2021 11:30 AM 6.5 (H) 4.8 - 5.6 % Final     Comment:    (NOTE) Pre diabetes:          5.7%-6.4%  Diabetes:              >6.4%  Glycemic control for   <7.0% adults with diabetes     CBG: Recent Labs  Lab 07/04/23 1135 07/04/23 1619 07/04/23 2124 07/05/23 0727 07/05/23 1149  GLUCAP 106* 163* 143* 118* 158*    Review of Systems:   Encephalopathic  Past Medical History:  He,  has a past medical history of Anxiety, Arthritis, Asthma, Bipolar disorder (HCC), Current every day smoker, Depression, Diabetes mellitus, type II (HCC), Dyspnea, History of kidney stones, History of pneumonia, Hyperlipidemia, Hypertension, Morbid obesity (HCC), Sedentary lifestyle, Seizures (HCC), and Sleep apnea.   Surgical History:   Past Surgical History:  Procedure  Laterality Date   CARDIAC CATHETERIZATION  2020   carpal tunel Left    COLONOSCOPY     HARDWARE REMOVAL Left 07/26/2022   Procedure: HARDWARE REMOVAL ELBOW;  Surgeon: Laneta Pintos, MD;  Location: [facility] OR;  Service: Orthopedics;  Laterality: Left;   ORIF HUMERUS FRACTURE Left 01/16/2022   Procedure: OPEN REDUCTION INTERNAL FIXATION (ORIF) DISTAL HUMERUS FRACTURE;  Surgeon: Laneta Pintos, MD;  Location: [facility] OR;  Service: Orthopedics;  Laterality: Left;   ORIF HUMERUS FRACTURE Left 01/29/2023   Procedure: OPEN REDUCTION INTERNAL FIXATION (ORIF) DISTAL HUMERUS FRACTURE;  Surgeon: Laneta Pintos, MD;  Location: [facility] OR;  Service: Orthopedics;  Laterality: Left;   OTHER SURGICAL HISTORY     R & L shoulder   OTHER SURGICAL HISTORY Right    Knee surgery x several   TOTAL HIP ARTHROPLASTY Left 12/26/2017   Procedure: LEFT TOTAL HIP ARTHROPLASTY ANTERIOR APPROACH;  Surgeon: Arnie Lao, MD;  Location: [facility] ORS;  Service: Orthopedics;  Laterality: Left;   TOTAL KNEE ARTHROPLASTY Left 11/23/2021   Procedure: LEFT TOTAL KNEE ARTHROPLASTY;  Surgeon: Arnie Lao, MD;  Location: [facility];  Service: Orthopedics;  Laterality: Left;     Social History:   reports that  he has been smoking cigarettes. He has a 12.5 pack-year smoking history. He has never used smokeless tobacco. He reports current drug use. Drug: Marijuana. He reports that he does not drink alcohol.   Family History:  His family history includes Diabetes in his brother, father, mother, and sister; Heart attack in his father and mother; Hypertension in his brother, father, mother, and sister; Osteoporosis in his mother.   Allergies Allergies  Allergen Reactions   Morphine And Codeine Anaphylaxis   Shellfish Allergy Anaphylaxis   Betadine  [Povidone Iodine ] Itching   Bupropion Other (See Comments)    Shaking of the body, hallucinations    Chlorhexidine  Hives    Patient reports never had issues CHG with mouth rinse.   Influenza Vaccines Hives   Metoprolol  Rash     Home Medications  Prior to Admission medications   Medication Sig Start Date End Date Taking? Authorizing Provider  albuterol  (PROVENTIL  HFA;VENTOLIN  HFA) 108 (90 Base) MCG/ACT inhaler Inhale 2 puffs into the lungs every 6 (six) hours as needed for wheezing or shortness of breath.   Yes [provider]  albuterol  (PROVENTIL ) (2.5 MG/3ML) 0.083% nebulizer solution 2.5 mg every 2 (two) hours as needed for wheezing or shortness of breath.   Yes [provider]  aspirin  EC 81 MG tablet Take 81 mg by mouth daily. Swallow whole.   Yes [provider]  busPIRone  (BUSPAR ) 15 MG tablet Take 15 mg by mouth at bedtime.   Yes [provider]  celecoxib  (CELEBREX ) 200 MG capsule Take 200 mg by mouth daily.   Yes [provider]  clonazePAM (KLONOPIN) 1 MG tablet Take 1 mg by mouth once as needed for anxiety.   Yes [provider]  cyclobenzaprine  (FLEXERIL ) 5 MG tablet Take 1 tablet (5 mg total) by mouth 3 (three) times daily as needed for muscle spasms. Patient taking differently: Take 5 mg by mouth daily as needed for muscle spasms. 01/30/23  Yes Versie Gores, PA-C  divalproex   (DEPAKOTE ) 500 MG DR tablet Take 500 mg by mouth 2 (two) times daily.   Yes [provider]  EPINEPHrine  0.3 mg/0.3 mL IJ SOAJ injection Inject 0.3 mg into the muscle as needed for anaphylaxis. 08/28/21  Yes [provider]  esomeprazole (NEXIUM) 40 MG capsule Take 40 mg by mouth in the morning.   Yes [provider]  Fluticasone -Salmeterol (ADVAIR) 500-50 MCG/DOSE AEPB Inhale 2 puffs into the lungs in the morning.   Yes [provider]  furosemide  (LASIX ) 40 MG tablet Take 40 mg by mouth daily as needed for fluid or edema.   Yes [provider]  gabapentin  (NEURONTIN ) 300 MG capsule Take 300-600 mg by mouth See admin instructions. 300 mg in the morning, 600 mg at bedtime   Yes [provider]  hydrALAZINE  (APRESOLINE ) 10 MG tablet Take 10 mg by mouth in the morning and at bedtime.   Yes [provider]  hydrochlorothiazide  (HYDRODIURIL ) 25 MG tablet Take 25 mg by mouth daily.   Yes [provider]  lisinopril  (ZESTRIL ) 40 MG tablet Take 40 mg by mouth daily. 03/25/23  Yes [provider]  metFORMIN  (GLUCOPHAGE ) 1000 MG tablet Take 1,000 mg by mouth 2 (two) times daily with a meal.   Yes [provider]  montelukast  (SINGULAIR ) 10 MG tablet Take 10 mg by mouth at bedtime.   Yes [provider]  OVER THE COUNTER MEDICATION Take 400 mg by mouth at bedtime. Burdock root   Yes [provider]  oxyCODONE  (ROXICODONE ) 5 MG immediate release tablet Take 1 tablet (5 mg total) by mouth every 4 (four) hours as needed for severe pain (pain score 7-10). 01/30/23  Yes McClung, Sarah A, PA-C  PARoxetine  (PAXIL ) 40 MG tablet Take 40 mg by mouth every morning.   Yes [provider]  spironolactone  (ALDACTONE ) 25 MG tablet Take 25 mg by mouth at bedtime. 10/07/21  Yes [provider]  tamsulosin  (FLOMAX ) 0.4 MG CAPS capsule Take 0.4 mg by mouth at bedtime. 10/07/21  Yes [provider]   testosterone  cypionate (DEPOTESTOSTERONE CYPIONATE) 200 MG/ML injection Inject 0.5 mLs (100 mg total) into the muscle every 14 (fourteen) days. 09/17/16  Yes Nida, Gebreselassie W, MD  verapamil  (CALAN -SR) 120 MG CR tablet Take 120 mg by mouth daily.   Yes [provider]  Vitamin D , Ergocalciferol , (DRISDOL ) 1.25 MG (50000 UNIT) CAPS capsule Take 1 capsule (50,000 Units total) by mouth every Thursday. Patient taking differently: Take 50,000 Units by mouth once a week. Tuesday 02/06/23  Yes Versie Gores, PA-C  buPROPion (WELLBUTRIN XL) 150 MG 24 hr tablet Take 150 mg by mouth daily. Patient not taking: Reported on 07/04/2023    [provider]     The patient is critically ill with multiple organ systems failure and requires high complexity decision making for assessment and support, frequent evaluation and titration of therapies, application of advanced monitoring technologies and extensive interpretation of multiple databases. Critical Care Time devoted to patient care services described in this note independent of APP/resident time (if applicable)  is 32 minutes.   Myer Artis MD [facility] Personal pager: See Amion If unanswered, please page CCM On-call: #[phone]

## 2023-07-06 ENCOUNTER — Inpatient Hospital Stay (HOSPITAL_COMMUNITY)

## 2023-07-06 DIAGNOSIS — I10 Essential (primary) hypertension: Secondary | ICD-10-CM

## 2023-07-06 DIAGNOSIS — C9000 Multiple myeloma not having achieved remission: Secondary | ICD-10-CM | POA: Diagnosis not present

## 2023-07-06 DIAGNOSIS — I4891 Unspecified atrial fibrillation: Secondary | ICD-10-CM | POA: Diagnosis not present

## 2023-07-06 DIAGNOSIS — Z515 Encounter for palliative care: Secondary | ICD-10-CM | POA: Diagnosis not present

## 2023-07-06 DIAGNOSIS — A411 Sepsis due to other specified staphylococcus: Secondary | ICD-10-CM | POA: Diagnosis not present

## 2023-07-06 DIAGNOSIS — L89313 Pressure ulcer of right buttock, stage 3: Secondary | ICD-10-CM | POA: Diagnosis not present

## 2023-07-06 DIAGNOSIS — R531 Weakness: Secondary | ICD-10-CM | POA: Diagnosis not present

## 2023-07-06 LAB — CBC WITH DIFFERENTIAL/PLATELET
Abs Immature Granulocytes: 0.2 10*3/uL — ABNORMAL HIGH (ref 0.00–0.07)
Basophils Absolute: 0 10*3/uL (ref 0.0–0.1)
Basophils Relative: 0 %
Eosinophils Absolute: 0 10*3/uL (ref 0.0–0.5)
Eosinophils Relative: 0 %
HCT: 24.9 % — ABNORMAL LOW (ref 39.0–52.0)
Hemoglobin: 7 g/dL — ABNORMAL LOW (ref 13.0–17.0)
Lymphocytes Relative: 33 %
Lymphs Abs: 3.3 10*3/uL (ref 0.7–4.0)
MCH: 25.9 pg — ABNORMAL LOW (ref 26.0–34.0)
MCHC: 28.1 g/dL — ABNORMAL LOW (ref 30.0–36.0)
MCV: 92.2 fL (ref 80.0–100.0)
Monocytes Absolute: 1.1 10*3/uL — ABNORMAL HIGH (ref 0.1–1.0)
Monocytes Relative: 11 %
Myelocytes: 2 %
Neutro Abs: 5.5 10*3/uL (ref 1.7–7.7)
Neutrophils Relative %: 54 %
Platelets: 150 10*3/uL (ref 150–400)
RBC: 2.7 MIL/uL — ABNORMAL LOW (ref 4.22–5.81)
RDW: 27.6 % — ABNORMAL HIGH (ref 11.5–15.5)
WBC: 10.1 10*3/uL (ref 4.0–10.5)
nRBC: 1.1 % — ABNORMAL HIGH (ref 0.0–0.2)

## 2023-07-06 LAB — URINE CULTURE: Culture: NO GROWTH

## 2023-07-06 LAB — COMPREHENSIVE METABOLIC PANEL WITH GFR
ALT: 13 U/L (ref 0–44)
AST: 27 U/L (ref 15–41)
Albumin: 1.5 g/dL — ABNORMAL LOW (ref 3.5–5.0)
Alkaline Phosphatase: 30 U/L — ABNORMAL LOW (ref 38–126)
Anion gap: 5 (ref 5–15)
BUN: 51 mg/dL — ABNORMAL HIGH (ref 6–20)
CO2: 21 mmol/L — ABNORMAL LOW (ref 22–32)
Calcium: 8.6 mg/dL — ABNORMAL LOW (ref 8.9–10.3)
Chloride: 115 mmol/L — ABNORMAL HIGH (ref 98–111)
Creatinine, Ser: 4.64 mg/dL — ABNORMAL HIGH (ref 0.61–1.24)
GFR, Estimated: 14 mL/min — ABNORMAL LOW (ref >60)
Glucose, Bld: 137 mg/dL — ABNORMAL HIGH (ref 70–99)
Potassium: 3 mmol/L — ABNORMAL LOW (ref 3.5–5.1)
Sodium: 141 mmol/L (ref 135–145)
Total Bilirubin: 0.7 mg/dL (ref 0.0–1.2)
Total Protein: >12 g/dL — ABNORMAL HIGH (ref 6.5–8.1)

## 2023-07-06 LAB — GLUCOSE, CAPILLARY
Glucose-Capillary: 187 mg/dL — ABNORMAL HIGH (ref 70–99)
Glucose-Capillary: 228 mg/dL — ABNORMAL HIGH (ref 70–99)
Glucose-Capillary: 186 mg/dL — ABNORMAL HIGH (ref 70–99)
Glucose-Capillary: 186 mg/dL — ABNORMAL HIGH (ref 70–99)

## 2023-07-06 LAB — BETA 2 MICROGLOBULIN, SERUM: Beta-2 Microglobulin: 21.9 mg/L — ABNORMAL HIGH (ref 0.6–2.4)

## 2023-07-06 LAB — HEPARIN LEVEL (UNFRACTIONATED): Heparin Unfractionated: 0.11 [IU]/mL — ABNORMAL LOW (ref 0.30–0.70)

## 2023-07-06 LAB — MAGNESIUM: Magnesium: 1.8 mg/dL (ref 1.7–2.4)

## 2023-07-06 MED ORDER — NICOTINE 14 MG/24HR TD PT24
14.0000 mg | MEDICATED_PATCH | Freq: Every day | TRANSDERMAL | Status: DC
  Administered 2023-07-06 – 2023-07-19 (×14): 14 mg via TRANSDERMAL
  Filled 2023-07-06 (×14): qty 1

## 2023-07-06 MED ORDER — HEPARIN (PORCINE) 25000 UT/250ML-% IV SOLN
1850.0000 [IU]/h | INTRAVENOUS | Status: DC
  Administered 2023-07-06: 1450 [IU]/h via INTRAVENOUS
  Administered 2023-07-07: 1750 [IU]/h via INTRAVENOUS
  Administered 2023-07-08: 1850 [IU]/h via INTRAVENOUS
  Filled 2023-07-06 (×3): qty 250

## 2023-07-06 MED ORDER — LACTULOSE ENEMA
300.0000 mL | Freq: Two times a day (BID) | ORAL | Status: DC
  Administered 2023-07-06 – 2023-07-08 (×5): 300 mL via RECTAL
  Filled 2023-07-06 (×6): qty 300

## 2023-07-06 MED ORDER — DILTIAZEM HCL-DEXTROSE 125-5 MG/125ML-% IV SOLN (PREMIX)
5.0000 mg/h | INTRAVENOUS | Status: DC
  Administered 2023-07-06 (×2): 15 mg/h via INTRAVENOUS
  Administered 2023-07-06: 5 mg/h via INTRAVENOUS
  Administered 2023-07-06 – 2023-07-08 (×5): 15 mg/h via INTRAVENOUS
  Filled 2023-07-06 (×7): qty 125

## 2023-07-06 MED ORDER — POTASSIUM CHLORIDE 10 MEQ/100ML IV SOLN
10.0000 meq | INTRAVENOUS | Status: AC
  Administered 2023-07-06 (×4): 10 meq via INTRAVENOUS
  Filled 2023-07-06 (×4): qty 100

## 2023-07-06 MED ORDER — MAGNESIUM SULFATE 2 GM/50ML IV SOLN
2.0000 g | Freq: Once | INTRAVENOUS | Status: AC
Start: 2023-07-06 — End: 2023-07-06
  Administered 2023-07-06: 2 g via INTRAVENOUS
  Filled 2023-07-06: qty 50

## 2023-07-06 MED ORDER — AMIODARONE IV BOLUS ONLY 150 MG/100ML
150.0000 mg | Freq: Once | INTRAVENOUS | Status: AC
  Administered 2023-07-06: 150 mg via INTRAVENOUS
  Filled 2023-07-06: qty 100

## 2023-07-06 MED ORDER — AMIODARONE IV BOLUS ONLY 150 MG/100ML: 150.0000 mg | Freq: Once | INTRAVENOUS | Status: DC

## 2023-07-06 MED ORDER — DILTIAZEM HCL 25 MG/5ML IV SOLN: 15.0000 mg | Freq: Once | INTRAVENOUS | Status: AC

## 2023-07-06 MED ORDER — AMIODARONE HCL IN DEXTROSE 360-4.14 MG/200ML-% IV SOLN
60.0000 mg/h | INTRAVENOUS | Status: AC
  Administered 2023-07-06 (×2): 60 mg/h via INTRAVENOUS
  Filled 2023-07-06: qty 200

## 2023-07-06 MED ORDER — AMIODARONE LOAD VIA INFUSION
150.0000 mg | Freq: Once | INTRAVENOUS | Status: AC
  Administered 2023-07-06: 150 mg via INTRAVENOUS
  Filled 2023-07-06: qty 83.34

## 2023-07-06 MED ORDER — DEXTROSE 5 % IV SOLN
250.0000 mg | Freq: Four times a day (QID) | INTRAVENOUS | Status: DC
  Administered 2023-07-06 – 2023-07-07 (×6): 250 mg via INTRAVENOUS
  Filled 2023-07-06 (×9): qty 2.5

## 2023-07-06 MED ORDER — METOPROLOL TARTRATE 5 MG/5ML IV SOLN
INTRAVENOUS | Status: AC
  Filled 2023-07-06: qty 5

## 2023-07-06 MED ORDER — DILTIAZEM LOAD VIA INFUSION
20.0000 mg | Freq: Once | INTRAVENOUS | Status: AC
  Administered 2023-07-06: 20 mg via INTRAVENOUS
  Filled 2023-07-06: qty 20

## 2023-07-06 MED ORDER — DILTIAZEM HCL 25 MG/5ML IV SOLN
INTRAVENOUS | Status: AC
  Administered 2023-07-06: 15 mg via INTRAVENOUS
  Filled 2023-07-06: qty 5

## 2023-07-06 MED ORDER — POTASSIUM CHLORIDE CRYS ER 20 MEQ PO TBCR: 30.0000 meq | EXTENDED_RELEASE_TABLET | Freq: Four times a day (QID) | ORAL | Status: DC

## 2023-07-06 MED ORDER — LACTULOSE 10 GM/15ML PO SOLN
30.0000 g | Freq: Three times a day (TID) | ORAL | Status: DC
  Filled 2023-07-06: qty 45

## 2023-07-06 MED ORDER — AMIODARONE HCL IN DEXTROSE 360-4.14 MG/200ML-% IV SOLN
30.0000 mg/h | INTRAVENOUS | Status: DC
  Administered 2023-07-06 – 2023-07-09 (×8): 30 mg/h via INTRAVENOUS
  Filled 2023-07-06 (×8): qty 200

## 2023-07-06 NOTE — Consult Note (Signed)
 Cardiology Consultation   Patient ID: Frank [name]. MRN: [id number]; DOB: [birth year]  Admit date: 07/03/2023 Date of Consult: 07/06/2023  PCP:  Serita Danes, MD   [facility] Providers Cardiologist:  None        HPI:   Frank [name]. is a 58 y.o. 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 patient initially presented in to outside hospital with altered mental status and was found to have sepsis secondary to community-acquired pneumonia, acute metabolic encephalopathy secondary to sepsis, AKI, hypercalcemia, symptomatic anemia requiring 3 unit PRBCs, and lytic bone lesions.  Oncology team evaluated the patient and there was concern for new diagnosis of multiple myeloma.  He was transferred to [facility] for evaluation with oncology.  Patient currently admitted in the ICU where he remains altered.  He had atrial fibrillation with rapid ventricular rates this morning for which cardiology has been consulted.  No family available at bedside.  Patient unable to provide additional history.    Past Medical History:  Diagnosis Date   Anxiety    Arthritis    Asthma    Bipolar disorder (HCC)    Current every day smoker    Depression    Diabetes mellitus, type II (HCC)    Dyspnea    History of kidney stones    History of pneumonia    Hyperlipidemia    Hypertension    Morbid obesity (HCC)    Sedentary lifestyle    Seizures (HCC)    last seizure 12 yrs ago, no current problem   Sleep apnea    uses CPAP nightly    Past Surgical History:  Procedure Laterality Date   CARDIAC CATHETERIZATION  2020   carpal tunel Left    COLONOSCOPY     HARDWARE REMOVAL Left 07/26/2022   Procedure: HARDWARE REMOVAL ELBOW;   Surgeon: Laneta Pintos, MD;  Location: [facility] OR;  Service: Orthopedics;  Laterality: Left;   ORIF HUMERUS FRACTURE Left 01/16/2022   Procedure: OPEN REDUCTION INTERNAL FIXATION (ORIF) DISTAL HUMERUS FRACTURE;  Surgeon: Laneta Pintos, MD;  Location: [facility] OR;  Service: Orthopedics;  Laterality: Left;   ORIF HUMERUS FRACTURE Left 01/29/2023   Procedure: OPEN REDUCTION INTERNAL FIXATION (ORIF) DISTAL HUMERUS FRACTURE;  Surgeon: Laneta Pintos, MD;  Location: [facility] OR;  Service: Orthopedics;  Laterality: Left;   OTHER SURGICAL HISTORY     R & L shoulder   OTHER SURGICAL HISTORY Right    Knee surgery x several   TOTAL HIP ARTHROPLASTY Left 12/26/2017   Procedure: LEFT TOTAL HIP ARTHROPLASTY ANTERIOR APPROACH;  Surgeon: Arnie Lao, MD;  Location: [facility] ORS;  Service: Orthopedics;  Laterality: Left;   TOTAL KNEE ARTHROPLASTY Left 11/23/2021   Procedure: LEFT TOTAL KNEE ARTHROPLASTY;  Surgeon: Arnie Lao, MD;  Location: [facility] ORS;  Service: Orthopedics;  Laterality: Left;    Inpatient Medications: Scheduled Meds:  amiodarone   150 mg Intravenous Once   furosemide   80 mg Intravenous Q12H   insulin  aspart  0-5 Units Subcutaneous QHS   insulin  aspart  0-9 Units Subcutaneous TID WC   lactulose   30 g Per Tube TID   metoprolol  tartrate       nicotine   14 mg Transdermal Daily  mouth rinse  15 mL Mouth Rinse 4 times per day   Continuous Infusions:  amiodarone      Followed by   amiodarone      azithromycin  Stopped (07/05/23 2119)   cefTRIAXone  (ROCEPHIN )  IV Stopped (07/06/23 0542)   diltiazem  (CARDIZEM ) infusion 15 mg/hr (07/06/23 0958)   famotidine  (PEPCID ) IV Stopped (07/05/23 1349)   fluconazole  (DIFLUCAN ) IV Stopped (07/04/23 1125)   magnesium  sulfate bolus IVPB     potassium chloride  10 mEq (07/06/23 0906)   PRN Meds: acetaminophen , acetaminophen , haloperidol  lactate, hydrALAZINE , labetalol , LORazepam , melatonin, metoprolol  tartrate, mouth rinse, mouth rinse, oxyCODONE ,  polyethylene glycol, prochlorperazine   Allergies:    Allergies  Allergen Reactions   Morphine And Codeine Anaphylaxis   Shellfish Allergy Anaphylaxis   Betadine  [Povidone Iodine ] Itching   Bupropion Other (See Comments)    Shaking of the body, hallucinations    Chlorhexidine  Hives    Patient reports never had issues CHG with mouth rinse.   Influenza Vaccines Hives   Metoprolol  Rash    Social History:   Social History   Socioeconomic History   Marital status: Married    Spouse name: Not on file   Number of children: Not on file   Years of education: Not on file   Highest education level: Not on file  Occupational History   Not on file  Tobacco Use   Smoking status: Every Day    Current packs/day: 0.50    Average packs/day: 0.5 packs/day for 25.0 years (12.5 ttl pk-yrs)    Types: Cigarettes   Smokeless tobacco: Never  Vaping Use   Vaping status: Never Used  Substance and Sexual Activity   Alcohol use: No   Drug use: Yes    Types: Marijuana    Comment: 01/28/2023   Sexual activity: Yes  Other Topics Concern   Not on file  Social History Narrative   Not on file   Social Drivers of Health   Financial Resource Strain: Not on file  Food Insecurity: No Food Insecurity (07/05/2023)   Hunger Vital Sign    Worried About Running Out of Food in the Last Year: Never true    Ran Out of Food in the Last Year: Never true  Transportation Needs: No Transportation Needs (07/05/2023)   PRAPARE - Administrator, Civil Service (Medical): No    Lack of Transportation (Non-Medical): No  Physical Activity: Not on file  Stress: Not on file  Social Connections: Not on file  Intimate Partner Violence: Unknown (07/03/2023)   Humiliation, Afraid, Rape, and Kick questionnaire    Fear of Current or Ex-Partner: Patient unable to answer    Emotionally Abused: No    Physically Abused: No    Sexually Abused: No    Family History:   Family History  Problem Relation Age of Onset    Diabetes Mother    Hypertension Mother    Heart attack Mother    Osteoporosis Mother    Diabetes Father    Hypertension Father    Heart attack Father    Diabetes Sister    Hypertension Sister    Diabetes Brother    Hypertension Brother      ROS:  Please see the history of present illness.   All other ROS reviewed and negative.     Physical Exam/Data:   Vitals:   07/06/23 0700 07/06/23 0730 07/06/23 0745 07/06/23 0800  BP: 114/65 98/67 92/69  93/71  Pulse: (!) 154 (!) 157    Resp: (!) 23 [name])  21 (!) 23 (!) 28  Temp:    98.9 F (37.2 C)  TempSrc:    Axillary  SpO2: 96% 99%    Weight:      Height:        Intake/Output Summary (Last 24 hours) at 07/06/2023 1006 Last data filed at 07/06/2023 0903 Gross per 24 hour  Intake 1083.95 ml  Output 4400 ml  Net -3316.05 ml      07/05/2023   11:32 AM 07/03/2023    4:30 AM 07/03/2023    2:22 AM  Last 3 Weights  Weight (lbs) 236 lb 8.9 oz 243 lb 14.4 oz 243 lb 14.4 oz  Weight (kg) 107.3 kg 110.632 kg 110.632 kg     Body mass index is 30.37 kg/m.  General: Well developed, in no acute distress.  Neck: No JVD.  Cardiac: Normal rate, regular rhythm.  Resp: Normal work of breathing.  Ext: No edema.  Neuro: No gross focal deficits.  Psych: Normal affect.   EKG:  The EKG was personally reviewed and demonstrates:  AF Telemetry:  Telemetry was personally reviewed and demonstrates:  Sinus tach until around 0630 this morning when patient developed AF w/ RVR.  Relevant CV Studies: Echo 12/17/2018 Summary   1. Normal left ventricular size and systolic function, ejection fraction > 55%.   2. Diastolic dysfunction - grade I (normal filling pressures).    3. Mitral annular calcification.    4. Aortic sclerosis.    5. Normal right ventricular size and systolic function.   Laboratory Data:  High Sensitivity Troponin:  No results for input(s): "TROPONINIHS" in the last 720 hours.   Chemistry Recent Labs  Lab 07/04/23 0547  07/05/23 0750 07/06/23 0305  NA 138 141 141  K 3.5 3.5 3.0*  CL 113* 114* 115*  CO2 22 21* 21*  GLUCOSE 87 130* 137*  BUN 34* 40* 51*  CREATININE 4.10* 4.34* 4.64*  CALCIUM  10.5* 9.3 8.6*  MG 1.6* 1.4* 1.8  GFRNONAA 16* 15* 14*  ANIONGAP 3* 6 5    Recent Labs  Lab 07/04/23 0547 07/05/23 0750 07/06/23 0305  PROT >12.0* >12.0* >12.0*  ALBUMIN  <1.5* <1.5* 1.5*  AST 12* 30 27  ALT 8 12 13   ALKPHOS 31* 34* 30*  BILITOT 0.5 0.7 0.7   Lipids No results for input(s): "CHOL", "TRIG", "HDL", "LABVLDL", "LDLCALC", "CHOLHDL" in the last 168 hours.  Hematology Recent Labs  Lab 07/04/23 0547 07/05/23 0750 07/06/23 0305  WBC 5.3 8.6 10.1  RBC 2.79* 2.66* 2.70*  HGB 7.5* 7.2* 7.0*  HCT 26.1* 24.9* 24.9*  MCV 93.5 93.6 92.2  MCH 26.9 27.1 25.9*  MCHC 28.7* 28.9* 28.1*  RDW 26.8* 26.9* 27.6*  PLT 154 148* 150   Thyroid No results for input(s): "TSH", "FREET4" in the last 168 hours.  BNPNo results for input(s): "BNP", "PROBNP" in the last 168 hours.  DDimer No results for input(s): "DDIMER" in the last 168 hours.   Radiology/Studies:  DG CHEST PORT 1 VIEW Result Date: 07/05/2023 CLINICAL DATA:  Dyspnea EXAM: PORTABLE CHEST 1 VIEW COMPARISON:  07/04/2023 FINDINGS: Hypoventilatory change. Cardiomegaly with mild central congestion. No pleural effusion or pneumothorax. Advanced degenerative change of the shoulders. IMPRESSION: Cardiomegaly with mild central congestion. Electronically Signed   By: Esmeralda Hedge M.D.   On: 07/05/2023 16:06   DG CHEST PORT 1 VIEW Result Date: 07/04/2023 CLINICAL DATA:  Hypertension and dyspnea, generalized weakness, AKI EXAM: PORTABLE CHEST 1 VIEW COMPARISON:  None are available. There is a report  from radiographs 12/14/2018 FINDINGS: Borderline cardiomegaly. Diffuse interstitial coarsening and airspace opacities bilaterally. No pleural effusion or pneumothorax. No displaced rib fractures. Advanced arthritis both shoulders. IMPRESSION: Diffuse  interstitial coarsening and airspace opacities bilaterally, likely edema. Atypical infection could appear similarly. Electronically Signed   By: Rozell Cornet M.D.   On: 07/04/2023 22:21   CT BONE MARROW BIOPSY & ASPIRATION Result Date: 07/04/2023 INDICATION: [id number] Myeloma (HCC) [id number] EXAM: CT GUIDED BONE MARROW ASPIRATION AND CORE BIOPSY MEDICATIONS: None. ANESTHESIA/SEDATION: Moderate (conscious) sedation was employed during this procedure. A total of Versed  2 mg and Fentanyl  50 mcg was administered intravenously. Moderate Sedation Time: 16 minutes. The patient's level of consciousness and vital signs were monitored continuously by radiology nursing throughout the procedure under my direct supervision. FLUOROSCOPY TIME:  CT dose; 314 mGycm COMPLICATIONS: None immediate. Estimated blood loss: <5 mL PROCEDURE: RADIATION DOSE REDUCTION: This exam was performed according to the departmental dose-optimization program which includes automated exposure control, adjustment of the mA and/or kV according to patient size and/or use of iterative reconstruction technique. Informed written consent was obtained from the patient and/or patient's representative after a thorough discussion of the procedural risks, benefits and alternatives. All questions were addressed. Maximal Sterile Barrier Technique was utilized including caps, mask, sterile gowns, sterile gloves, sterile drape, hand hygiene and skin antiseptic. A timeout was performed prior to the initiation of the procedure. The patient was positioned prone and non-contrast localization CT was performed of the pelvis to demonstrate the iliac marrow spaces. Under sterile conditions and local anesthesia, an 11 gauge coaxial bone biopsy needle was advanced into the RIGHT iliac marrow space. Needle position was confirmed with CT imaging. Initially, bone marrow aspiration was performed. Next, the 11 gauge outer cannula was utilized to obtain a 1 iliac bone marrow core  biopsy. Needle was removed. Hemostasis was obtained with compression. The patient tolerated the procedure well. Samples were prepared with the cytotechnologist. IMPRESSION: Successful CT-guided bone marrow aspiration and biopsy. Art Largo, MD [facility] Electronically Signed   By: Art Largo M.D.   On: 07/04/2023 12:21     Assessment and Plan:   #. Atrial fibrillation with RVR: Precipitated by acute critical illness.  #. Secondary hypercoagulable state due to atrial fibrillation: CHADSVASC score of 2 -diabetes and hypertension. - Current episode of AF started <24 hours. Recommend bolusing IV amiodarone  150mg  then continuing with standard 24 hour IV load. After 24 hours will evaluate for transitioning to oral, if stable parenteral access, or continuing IV infusion at lowest dose.   - Continue monitoring QT interval. May need to change azithromycin  to doxycycline to avoid QT prolongation now that starting amiodarone .  - Start therapeutic anti-coagulation if no concerns for bleeding.  - Can stop diltiazem  infusion once he converts to sinus.  - I did not see an echo report in paper chart. Last available in Epic is from 2020. Will order echocardiogram here.  - Optimize volume status and electrolytes to help with arrhythmia burden.  #. Multiple Myeloma #. Acute metabolic encephalopathy #. Hypercalcemia #. Anemia of chronic disease #. AKI  #. HTN #. Diabetes, type II - Per primary and critical care medicine.  For questions or updates, please contact [facility] Please consult [email or web address] for contact info under    Signed, Ardeen Kohler, MD  07/06/2023 10:06 AM

## 2023-07-06 NOTE — Progress Notes (Addendum)
 PHARMACY - ANTICOAGULATION CONSULT NOTE  Pharmacy Consult for heparin  Indication: atrial fibrillation  Allergies  Allergen Reactions   Morphine And Codeine Anaphylaxis   Shellfish Allergy Anaphylaxis   Betadine  [Povidone Iodine ] Itching   Bupropion Other (See Comments)    Shaking of the body, hallucinations    Chlorhexidine  Hives    Patient reports never had issues CHG with mouth rinse.   Influenza Vaccines Hives   Metoprolol  Rash    Patient Measurements: Height: 6\' 2"  (188 cm) Weight: 107.3 kg (236 lb 8.9 oz) IBW/kg (Calculated) : 82.2 HEPARIN  DW (KG): 104.1  Vital Signs: Temp: 98.5 F (36.9 C) (04/20 2045) Temp Source: Oral (04/20 2045) BP: 158/103 (04/20 2100) Pulse Rate: 134 (04/20 2100)  Labs: Recent Labs    07/04/23 0547 07/05/23 0750 07/05/23 2227 07/06/23 0305 07/06/23 2144  HGB 7.5* 7.2*  --  7.0*  --   HCT 26.1* 24.9*  --  24.9*  --   PLT 154 148*  --  150  --   LABPROT  --   --  23.3*  --   --   INR  --   --  2.1*  --   --   HEPARINUNFRC  --   --   --   --  0.11*  CREATININE 4.10* 4.34*  --  4.64*  --     Estimated Creatinine Clearance: 22.4 mL/min (A) (by C-G formula based on SCr of 4.64 mg/dL (H)).   Medical History: Past Medical History:  Diagnosis Date   Anxiety    Arthritis    Asthma    Bipolar disorder (HCC)    Current every day smoker    Depression    Diabetes mellitus, type II (HCC)    Dyspnea    History of kidney stones    History of pneumonia    Hyperlipidemia    Hypertension    Morbid obesity (HCC)    Sedentary lifestyle    Seizures (HCC)    last seizure 12 yrs ago, no current problem   Sleep apnea    uses CPAP nightly      Assessment: 58 year old male transferred from OSH. He was receiving treatment for hypercalcemia in setting of suspected multiple myeloma. Treatment was continued here. Patient with altered mental status. On 4/19 pm, the patient developed afib with RVR, given diltiazem  IVP and subsequently started on  infusion. Remains in afib, amiodarone  bolus given; infusion to start. Pharmacy has been consulted for heparin  management.  Of note, patient with anemia of chronic disease. Hgb 6.1 at OSH, transfused 3 units PRBC there. His Hgb remains low, down to 7 today. Baseline INR 2.1.   2144 HL 0.11 subtherapeutic on 1450 units/hr No bleeding noted per RN  Goal of Therapy:  Heparin  level 0.3-0.7 units/ml Monitor platelets by anticoagulation protocol: Yes   Plan:  -Given need for transfusions and Hgb low (trending down), will defer heparin  bolus -increase heparin  drip to 1750 units/hr -Check 8 hour heparin  level -CBC daily -Monitor closely for new or worsening bleeding; if Hgb continues to trend down requiring transfusion, will need to assess risk/benefit of continued anticoagulation   [place] RPh 07/06/2023, 10:22 PM

## 2023-07-06 NOTE — Progress Notes (Signed)
    Patient Name: Adrean Heitz.           DOB: [birth year]  MRN: [id number]      Admission Date: 07/03/2023  Attending Provider: Audria Leather, MD  Primary Diagnosis: Generalized weakness   Level of care: Stepdown    CROSS COVER NOTE   Date of Service   07/06/2023   Kiko Ripp., 58 y.o. male, was admitted on 07/03/2023 for Generalized weakness.    HPI/Events of Note   Afib RVR 160-170's. Confirmed with EKG.  BP stable. No distress reported.  Responds to name, but not following commands. HR somewhat improved after 15 mg IV Cardizem  push; HR now 140's. BP stable. Will add another cardizem  bolus and continuous infusion.  Potasium level -3.0. Magnesium  1.8. Adding IV replacement.    Interventions/ Plan   Cardizem  IV push and continuous infusion.  Electrolyte replacement         Bailey Lesser, DNP, ACNPC- AG [facility]

## 2023-07-06 NOTE — Progress Notes (Signed)
 PROGRESS NOTE    Frank [name].  LKG:[id number] DOB: [birth year] DOA: 07/03/2023 PCP: Serita Danes, MD   Brief Narrative:  58 y.o. 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  He was admitted on 06/29/2023 through 07/02/2023.  The patient was admitted for sepsis secondary to community-acquired pneumonia, acute metabolic encephalopathy secondary to sepsis, AKI, hypercalcemia, symptomatic anemia requiring 3 unit PRBCs, and lytic bone lesions with concern for multiple myeloma.     The patient was seen by medical oncology as [facility].  He was in the process of a workup for multiple myeloma.  His family requested transfer to [facility] for continuity of care since most of his providers are from [facility].  He had presented at [facility] with 1 week of generalized weakness.  Associated with hemoglobin of 6.1 (received 3 unit PRBCs which improved to 8.5).  Creatinine on admission 5.84 improved to 5.33.  IgG 13428, IgA 85, IgM less than 8, free kappa 158, free lambda 11.6, free kappa lambda ratio 13.50, M spike pending.   He was transferred to [facility] for oncology evaluation.  Nephrology was also consulted.  Assessment & Plan:   Suspected multiple myeloma -Presented with acute encephalopathy, AKI, hypercalcemia, symptomatic anemia and lytic bone lesions concerning for multiple myeloma - Oncology following: Currently on Decadron .  Status post bone marrow biopsy by IR on 07/04/2023.  Hypercalcemia - Possibly from above.  Corrected calcium  is 10.5 today  -Patient received a dose of IV Zometa  on 07/03/2023 as per oncology.  Patient apparently received calcitonin at [facility].  He received calcitonin here as well as per  oncology. -Currently also on scheduled Lasix  as per nephrology.  Paroxysmal A-fib with RVR - Patient went into A-fib with RVR earlier this morning and has been started on Cardizem  drip.  Heart rates in the 150s.  Might have to start amiodarone  drip.  Consult cardiology.  AKI - Presented with creatinine of 5.84 at outside facility.  Creatinine 4.64 today.  Nephrology following.  IV fluids discontinued by nephrology.  Recently diagnosed and treated for community-acquired bacterial pneumonia - Received at least 3 days of IV antibiotics at outside hospital.  Continue Rocephin  and Zithromax  to complete course  Acute metabolic encephalopathy -possibly from all of the above.  Still intermittently confused and agitated.  Continue Haldol  and Ativan  as needed along with restraints if needed - PCCM following as well. -Ammonia elevated at 81 yesterday.  Start lactulose : Nursing staff will try to put an NG tube for the same.  If unable, will have to give rectal lactulose .  Hypertension - Blood pressure extremely elevated yesterday requiring high doses of IV as needed hydralazine  and labetalol .  Blood pressure improving and currently on the lower side.  DC clonidine  patch.  Currently on Cardizem  drip.  Anemia of chronic disease -presented with hemoglobin of 6.1 at outside hospital.  Transfused 3 units packed red cells.  Hemoglobin 7 today.  Monitor.  Transfuse if hemoglobin is less than 7  OSA--continue CPAP at night  Seizure disorder -Does not look like patient is on antiseizure medications at home.  Seizure precautions  Hypoalbuminemia - Severe.  Follow nutrition recommendations when patient is more awake.  Diabetes mellitus type 2 - Continue CBGs with SSI  Obesity class I -  Outpatient follow-up  Tobacco use disorder - Patient was counseled regarding tobacco cessation by admitting hospitalist    DVT prophylaxis: SCDs Code Status: Full Family Communication: Wife at bedside on  07/05/2023 Disposition Plan: Status is: Inpatient Remains inpatient appropriate because: Of severity of illness    Consultants: Oncology/nephrology/PCCM/IR.  Consult cardiology  Procedures: As above Antimicrobials: Rocephin  and Zithromax    Subjective: Patient seen and examined at bedside.  Continues to have intermittent agitation requiring IV Haldol .  Now spiking fevers.  No vomiting, seizures reported.   Objective: Vitals:   07/06/23 0400 07/06/23 0600 07/06/23 0700 07/06/23 0730  BP: (!) 148/95 (!) 152/95 114/65   Pulse: (!) 113 100 (!) 154 (!) 157  Resp: (!) 29 20 (!) 23 (!) 21  Temp: 99.7 F (37.6 C)     TempSrc: Axillary     SpO2: 100% 99% 96% 99%  Weight:      Height:        Intake/Output Summary (Last 24 hours) at 07/06/2023 0747 Last data filed at 07/06/2023 0600 Gross per 24 hour  Intake 358.66 ml  Output 5400 ml  Net -5041.34 ml   Filed Weights   07/03/23 0222 07/03/23 0430 07/05/23 1132  Weight: 110.6 kg 110.6 kg 107.3 kg    Examination:  General: On 2 L oxygen via nasal cannula.  Chronically ill and deconditioned looking. ENT/neck: No palpable neck masses or obvious JVD elevation noted respiratory: Mild decreased breath sounds at bases with scattered crackles  CVS: Remains tachycardic; S1 and S2 are heard  abdominal: Soft, obese, nontender, distended mildly; no organomegaly, bowel sounds are heard normally Extremities: No clubbing; mild lower extremity edema present.  Left upper extremity is in an immobilizer CNS: No obvious focal deficits noted.  Wakes up sightly, extremely slow to respond.   Lymph: No obvious palpable lymphadenopathy Skin: No obvious petechiae/rashes psych: Currently not agitated.  Extremely flat affect.  Musculoskeletal: No obvious other joint erythema/tenderness    Data Reviewed: I have personally reviewed following labs and imaging studies  CBC: Recent Labs  Lab 07/03/23 0450 07/04/23 0547 07/05/23 0750 07/06/23 0305  WBC  4.3 5.3 8.6 10.1  NEUTROABS 1.4* 1.2* 4.6 5.5  HGB 7.2* 7.5* 7.2* 7.0*  HCT 24.6* 26.1* 24.9* 24.9*  MCV 92.8 93.5 93.6 92.2  PLT 154 154 148* 150   Basic Metabolic Panel: Recent Labs  Lab 07/03/23 0505 07/04/23 0547 07/05/23 0750 07/06/23 0305  NA 135 138 141 141  K 3.6 3.5 3.5 3.0*  CL 112* 113* 114* 115*  CO2 20* 22 21* 21*  GLUCOSE 104* 87 130* 137*  BUN 34* 34* 40* 51*  CREATININE 4.32* 4.10* 4.34* 4.64*  CALCIUM  9.9 10.5* 9.3 8.6*  MG 1.8 1.6* 1.4* 1.8  PHOS 3.8  --   --   --    GFR: Estimated Creatinine Clearance: 22.4 mL/min (A) (by C-G formula based on SCr of 4.64 mg/dL (H)). Liver Function Tests: Recent Labs  Lab 07/03/23 0505 07/04/23 0547 07/05/23 0750 07/06/23 0305  AST 14* 12* 30 27  ALT 5 8 12 13   ALKPHOS 31* 31* 34* 30*  BILITOT 0.5 0.5 0.7 0.7  PROT >12.0* >12.0* >12.0* >12.0*  ALBUMIN  <1.5* <1.5* <1.5* 1.5*   No results for input(s): "LIPASE", "AMYLASE" in the last 168 hours. Recent Labs  Lab 07/05/23 1716  AMMONIA 81*   Coagulation Profile: Recent Labs  Lab 07/05/23 2227  INR 2.1*   Cardiac Enzymes: No results for input(s): "CKTOTAL", "CKMB", "CKMBINDEX", "TROPONINI" in the  last 168 hours. BNP (last 3 results) No results for input(s): "PROBNP" in the last 8760 hours. HbA1C: No results for input(s): "HGBA1C" in the last 72 hours. CBG: Recent Labs  Lab 07/04/23 2124 07/05/23 0727 07/05/23 1149 07/05/23 1626 07/05/23 2341  GLUCAP 143* 118* 158* 167* 123*   Lipid Profile: No results for input(s): "CHOL", "HDL", "LDLCALC", "TRIG", "CHOLHDL", "LDLDIRECT" in the last 72 hours. Thyroid Function Tests: No results for input(s): "TSH", "T4TOTAL", "FREET4", "T3FREE", "THYROIDAB" in the last 72 hours. Anemia Panel: No results for input(s): "VITAMINB12", "FOLATE", "FERRITIN", "TIBC", "IRON", "RETICCTPCT" in the last 72 hours. Sepsis Labs: Recent Labs  Lab 07/03/23 0450  PROCALCITON 0.17    Recent Results (from the past 240 hours)   MRSA Next Gen by PCR, Nasal     Status: None   Collection Time: 07/05/23 11:45 AM   Specimen: Nasal Mucosa; Nasal Swab  Result Value Ref Range Status   MRSA by PCR Next Gen NOT DETECTED NOT DETECTED Final    Comment: (NOTE) The GeneXpert MRSA Assay (FDA approved for NASAL specimens only), is one component of a comprehensive MRSA colonization surveillance program. It is not intended to diagnose MRSA infection nor to guide or monitor treatment for MRSA infections. Test performance is not FDA approved in patients less than 13 years old. Performed at [facility], [address], [place], [place] [address]   Culture, blood (Routine X 2) w Reflex to ID Panel     Status: None (Preliminary result)   Collection Time: 07/05/23 12:49 PM   Specimen: BLOOD LEFT HAND  Result Value Ref Range Status   Specimen Description   Final    BLOOD LEFT HAND Performed at [facility], [address] [place], [place] [address]    Special Requests   Final    BOTTLES DRAWN AEROBIC AND ANAEROBIC Blood Culture results may not be optimal due to an inadequate volume of blood received in culture bottles Performed at [facility], [address], [place], [place] [address]    Culture PENDING  Incomplete   Report Status PENDING  Incomplete  Culture, blood (Routine X 2) w Reflex to ID Panel     Status: None (Preliminary result)   Collection Time: 07/05/23 12:57 PM   Specimen: BLOOD LEFT HAND  Result Value Ref Range Status   Specimen Description   Final    BLOOD LEFT HAND Performed at [facility], [address] [place], [place] [address]    Special Requests   Final    BOTTLES DRAWN AEROBIC AND ANAEROBIC Blood Culture results may not be optimal due to an inadequate volume of blood received in culture bottles Performed at [facility], [address], [place], [place] [address]    Culture PENDING  Incomplete   Report Status PENDING   Incomplete         Radiology Studies: DG CHEST PORT 1 VIEW Result Date: 07/05/2023 CLINICAL DATA:  Dyspnea EXAM: PORTABLE CHEST 1 VIEW COMPARISON:  07/04/2023 FINDINGS: Hypoventilatory change. Cardiomegaly with mild central congestion. No pleural effusion or pneumothorax. Advanced degenerative change of the shoulders. IMPRESSION: Cardiomegaly with mild central congestion. Electronically Signed   By: Esmeralda Hedge M.D.   On: 07/05/2023 16:06   DG CHEST PORT 1 VIEW Result Date: 07/04/2023 CLINICAL DATA:  Hypertension and dyspnea, generalized weakness, AKI EXAM: PORTABLE CHEST 1 VIEW COMPARISON:  None are available. There is a report from radiographs 12/14/2018 FINDINGS: Borderline cardiomegaly. Diffuse interstitial  coarsening and airspace opacities bilaterally. No pleural effusion or pneumothorax. No displaced rib fractures. Advanced arthritis both shoulders. IMPRESSION: Diffuse interstitial coarsening and airspace opacities bilaterally, likely edema. Atypical infection could appear similarly. Electronically Signed   By: Rozell Cornet M.D.   On: 07/04/2023 22:21   CT BONE MARROW BIOPSY & ASPIRATION Result Date: 07/04/2023 INDICATION: [id number] Myeloma (HCC) [id number] EXAM: CT GUIDED BONE MARROW ASPIRATION AND CORE BIOPSY MEDICATIONS: None. ANESTHESIA/SEDATION: Moderate (conscious) sedation was employed during this procedure. A total of Versed  2 mg and Fentanyl  50 mcg was administered intravenously. Moderate Sedation Time: 16 minutes. The patient's level of consciousness and vital signs were monitored continuously by radiology nursing throughout the procedure under my direct supervision. FLUOROSCOPY TIME:  CT dose; 314 mGycm COMPLICATIONS: None immediate. Estimated blood loss: <5 mL PROCEDURE: RADIATION DOSE REDUCTION: This exam was performed according to the departmental dose-optimization program which includes automated exposure control, adjustment of the mA and/or kV according to patient size and/or use  of iterative reconstruction technique. Informed written consent was obtained from the patient and/or patient's representative after a thorough discussion of the procedural risks, benefits and alternatives. All questions were addressed. Maximal Sterile Barrier Technique was utilized including caps, mask, sterile gowns, sterile gloves, sterile drape, hand hygiene and skin antiseptic. A timeout was performed prior to the initiation of the procedure. The patient was positioned prone and non-contrast localization CT was performed of the pelvis to demonstrate the iliac marrow spaces. Under sterile conditions and local anesthesia, an 11 gauge coaxial bone biopsy needle was advanced into the RIGHT iliac marrow space. Needle position was confirmed with CT imaging. Initially, bone marrow aspiration was performed. Next, the 11 gauge outer cannula was utilized to obtain a 1 iliac bone marrow core biopsy. Needle was removed. Hemostasis was obtained with compression. The patient tolerated the procedure well. Samples were prepared with the cytotechnologist. IMPRESSION: Successful CT-guided bone marrow aspiration and biopsy. Art Largo, MD [facility]iology Electronically Signed   By: Art Largo M.D.   On: 07/04/2023 12:21        Scheduled Meds:  cloNIDine   0.2 mg Transdermal Weekly   dexamethasone  (DECADRON ) injection  40 mg Intravenous Q24H   furosemide   80 mg Intravenous Q12H   insulin  aspart  0-5 Units Subcutaneous QHS   insulin  aspart  0-9 Units Subcutaneous TID WC   metoprolol  tartrate       mouth rinse  15 mL Mouth Rinse 4 times per day   Continuous Infusions:  azithromycin  Stopped (07/05/23 2119)   cefTRIAXone  (ROCEPHIN )  IV Stopped (07/06/23 0542)   diltiazem  (CARDIZEM ) infusion 15 mg/hr (07/06/23 0815)   famotidine  (PEPCID ) IV Stopped (07/05/23 1349)   fluconazole  (DIFLUCAN ) IV Stopped (07/04/23 1125)   magnesium  sulfate bolus IVPB      potassium chloride             Audria Leather, MD [facility] 07/06/2023, 7:47 AM

## 2023-07-06 NOTE — Progress Notes (Signed)
 Requested IR cortrak placement

## 2023-07-06 NOTE — Plan of Care (Signed)
  Problem: Clinical Measurements: Goal: Diagnostic test results will improve Outcome: Progressing Goal: Respiratory complications will improve Outcome: Progressing Goal: Cardiovascular complication will be avoided Outcome: Progressing   

## 2023-07-06 NOTE — Progress Notes (Signed)
 Heart rate 160-180s sustaining afib with RVR. Contacted Elink and [facility] on-call for bedside evaluation. Obtained EKG with confirmed rhythm of afib.

## 2023-07-06 NOTE — Plan of Care (Signed)
 Frank [name] remains nonverbal, restless, unable to follow commands, but was able to say "yeah" twice and shake his head no to questions at various times throughout the day.  Lactulose  enema administered via fecal management system.  Entered Afib RVR this morning and is now on Cardizem  and amiodarone  but has not yet converted to sinus rhythm and rate remains greater than 115.  Attempted CorTrak placement but was unable to pass tube into stomach.  Received 4 runs of Kcl and 1 run of Mg.

## 2023-07-06 NOTE — Progress Notes (Signed)
 PHARMACY - ANTICOAGULATION CONSULT NOTE  Pharmacy Consult for heparin  Indication: atrial fibrillation  Allergies  Allergen Reactions   Morphine And Codeine Anaphylaxis   Shellfish Allergy Anaphylaxis   Betadine  [Povidone Iodine ] Itching   Bupropion Other (See Comments)    Shaking of the body, hallucinations    Chlorhexidine  Hives    Patient reports never had issues CHG with mouth rinse.   Influenza Vaccines Hives   Metoprolol  Rash    Patient Measurements: Height: 6\' 2"  (188 cm) Weight: 107.3 kg (236 lb 8.9 oz) IBW/kg (Calculated) : 82.2 HEPARIN  DW (KG): 104.1  Vital Signs: Temp: 98.9 F (37.2 C) (04/20 0800) Temp Source: Axillary (04/20 0800) BP: 93/71 (04/20 0800) Pulse Rate: 157 (04/20 0730)  Labs: Recent Labs    07/04/23 0547 07/05/23 0750 07/05/23 2227 07/06/23 0305  HGB 7.5* 7.2*  --  7.0*  HCT 26.1* 24.9*  --  24.9*  PLT 154 148*  --  150  LABPROT  --   --  23.3*  --   INR  --   --  2.1*  --   CREATININE 4.10* 4.34*  --  4.64*    Estimated Creatinine Clearance: 22.4 mL/min (A) (by C-G formula based on SCr of 4.64 mg/dL (H)).   Medical History: Past Medical History:  Diagnosis Date   Anxiety    Arthritis    Asthma    Bipolar disorder (HCC)    Current every day smoker    Depression    Diabetes mellitus, type II (HCC)    Dyspnea    History of kidney stones    History of pneumonia    Hyperlipidemia    Hypertension    Morbid obesity (HCC)    Sedentary lifestyle    Seizures (HCC)    last seizure 12 yrs ago, no current problem   Sleep apnea    uses CPAP nightly      Assessment: 58 year old male transferred from OSH. He was receiving treatment for hypercalcemia in setting of suspected multiple myeloma. Treatment was continued here. Patient with altered mental status. On 4/19 pm, the patient developed afib with RVR, given diltiazem  IVP and subsequently started on infusion. Remains in afib, amiodarone  bolus given; infusion to start. Pharmacy has  been consulted for heparin  management.  Of note, patient with anemia of chronic disease. Hgb 6.1 at OSH, transfused 3 units PRBC there. His Hgb remains low, down to 7 today. Baseline INR 2.1.  Goal of Therapy:  Heparin  level 0.3-0.7 units/ml Monitor platelets by anticoagulation protocol: Yes   Plan:  -Given need for transfusions and Hgb low (trending down), will defer heparin  bolus -Start heparin  infusion at 1450 units/hr -Check 6 hour heparin  level -CBC daily -Monitor closely for new or worsening bleeding; if Hgb continues to trend down requiring transfusion, will need to assess risk/benefit of continued anticoagulation   Lolita Rise, PharmD, BCPS Clinical Pharmacist 07/06/2023 10:16 AM

## 2023-07-06 NOTE — Progress Notes (Signed)
 [facility] Progress Note  Subjective:  6150 cc  UOP yesterday and 1.5 L today after IV lasix  80 x 2 IV lasix  stopped today as he has diuresed enough  Creat stuck in 4-5 range Pt moved to SDU / ICU Remains confused, have d/w CCM NH3 was 80s, getting lactulose  per rectum Didn't tolerate bedside NG placement  Going for Cortrak w/ IR prob tomorrow   Vitals:   07/06/23 0200 07/06/23 0300 07/06/23 0400 07/06/23 0600  BP: (!) 122/92 (!) 142/88 (!) 148/95 (!) 152/95  Pulse: (!) 108 (!) 113 (!) 113 100  Resp: (!) 24 (!) 25 (!) 29 20  Temp:   99.7 F (37.6 C)   TempSrc:   Axillary   SpO2: 100% 99% 100% 99%  Weight:      Height:        Exam: Gen remains lethargic, not responding verbally Opens eyes occassionally No jvd or bruits Chest clear bilat to bases RRR no MRG Abd soft ntnd no mass or ascites +bs GU condom cath in place, urine clear yellow Ext no  LE or UE edema, no other edema Neuro is lethargic, no myoclonus or asterixis      Renal-related home meds: Celecoxib  200 every day Lasix  40 every day prn Hydrochlorothiazide  25 every day Hydralazine  10 bid Lisinopril  40 every day  Aldactone  25 hs Verapamil  SR 120 every day Others: wellbutrin xl, flomax , paxil , Oxy IR, singulair , metformin , gabapentin , advair, nexium, depakote , klonopin, buspar , aspirin , albuterol  prn    Date                             Creat               eGFR (ml/min) 2019                            1.00- 1.09 2023                            0.91- 0.93 2024                            1.11- 1.52        53 - >60 ml/min 06/30/23                        5.80                 At OSH 4/17                             4.32                 At [facility] 07/04/23                        4.10   Na 138  K 3.5  CO2 22 BUN 34  creat 4.10  alb < 1.5   LDH 125 CRP < 0.5   Hb 7.5  plt 154  WBC 5 K  vit D 25-hydroxy - 17 BP's high to wnl 140-180/90- 100, HR 98-120, RR 18    Assessment/ Plan: AKi on CKD 3a - b/l  creat 1.1- 1.5 from 2024, eGFR 53- >60 ml/min. Creat here 5.8 on  presentation at OSH and last several days in the 4.0- 4.6 range. AKI due to possible myeloma kidney +/- hyperCa +/- ACEi (home med). CXR 4/18 showed pulm edema so IVF's dc'd and gave IV lasix  80mg  x 1 yesterday. Had very large response w/ 7-8 L UOP in 24 hrs, so IV lasix  put on hold for now. Looks euvolemic. Cont to follow. Have d/w CCM, don't see a strong indication for RRT at this time. AMS could be due to [facility], psych issues, less likely uremia. Will follow.   HTN - uncontrolled. Was on 3 BP meds and 2 diuretics at home. Not able to take pills here. BP's controlled now w/ cardizem  gtt and prn IV meds.  Volume - no LE edema but CXR +pulm edema. IV lasix  as above Atrial fib- started on IV amio and IV diltiazem  Hypercalcemia - looks resolved for now. Possible myeloma, w/u in progress, oncology following. SP zometa  here x 1, also getting calcitonin.  Multifocal PNA - on IV abx Anemia - sp 3 units prbc's already Bipolar d/o    Rob Zana Hesselbach MD  [facility] 07/06/2023, 7:29 AM  Recent Labs  Lab 07/03/23 0505 07/04/23 0547 07/05/23 0750 07/06/23 0305  HGB  --    < > 7.2* 7.0*  ALBUMIN  <1.5*   < > <1.5* 1.5*  CALCIUM  9.9   < > 9.3 8.6*  PHOS 3.8  --   --   --   CREATININE 4.32*   < > 4.34* 4.64*  K 3.6   < > 3.5 3.0*   < > = values in this interval not displayed.   No results for input(s): "IRON", "TIBC", "FERRITIN" in the last 168 hours. Inpatient medications:  cloNIDine   0.2 mg Transdermal Weekly   dexamethasone  (DECADRON ) injection  40 mg Intravenous Q24H   diltiazem   20 mg Intravenous Once   furosemide   80 mg Intravenous Q12H   insulin  aspart  0-5 Units Subcutaneous QHS   insulin  aspart  0-9 Units Subcutaneous TID WC   metoprolol  tartrate       mouth rinse  15 mL Mouth Rinse 4 times per day    azithromycin  Stopped (07/05/23 2119)   cefTRIAXone  (ROCEPHIN )  IV Stopped (07/06/23 0542)   diltiazem  (CARDIZEM ) infusion 5 mg/hr  (07/06/23 0715)   famotidine  (PEPCID ) IV Stopped (07/05/23 1349)   fluconazole  (DIFLUCAN ) IV Stopped (07/04/23 1125)   magnesium  sulfate bolus IVPB     potassium chloride      acetaminophen , acetaminophen , haloperidol  lactate, hydrALAZINE , labetalol , LORazepam , melatonin, metoprolol  tartrate, mouth rinse, mouth rinse, oxyCODONE , polyethylene glycol, prochlorperazine 

## 2023-07-06 NOTE — Progress Notes (Addendum)
 NAME:  Frank Seavey., MRN:  [id number], DOB:  1965-12-02, LOS: 3 ADMISSION DATE:  07/03/2023, CONSULTATION DATE:  07/06/23 REFERRING MD:  Dr Maury Space, CHIEF COMPLAINT:  Encephalopathy   History of Present Illness:  Asked to see patient for encephalopathy   Transferred from [facility] with 1 week of generalized weakness, altered mentation, decreased appetite Recently being worked up for multiple myeloma.  Transferred to [facility] for oncology evaluation Nephrology consulted for AKI   Background history of obesity, asthma, obstructive sleep apnea on CPAP GERD, hypertension, hyperlipidemia Has been feeling weak for the last few days according to spouse Admitted with concern for sepsis, community-acquired pneumonia, antibiotic encephalopathy Workup revealed lytic bony lesions with concern for multiple myeloma Transfused with 3 units packed red blood cells Evaluation so far-bone marrow 07/04/2023  Pertinent  Medical History   Past Medical History:  Diagnosis Date   Anxiety    Arthritis    Asthma    Bipolar disorder (HCC)    Current every day smoker    Depression    Diabetes mellitus, type II (HCC)    Dyspnea    History of kidney stones    History of pneumonia    Hyperlipidemia    Hypertension    Morbid obesity (HCC)    Sedentary lifestyle    Seizures (HCC)    last seizure 12 yrs ago, no current problem   Sleep apnea    uses CPAP nightly   Significant Hospital Events: Including procedures, antibiotic start and stop dates in addition to other pertinent events   4/19-blood cultures, MRSA PCR negative 4/19 chest x-ray with mild pulmonary congestion 4/20 atrial fibrillation with RVR  Interim History / Subjective:  Patient still with agitation, restlessness A-fib with RVR, on Cardizem  drip  Objective   Blood pressure 93/71, pulse (!) 157, temperature 98.9 F (37.2 C), temperature source Axillary, resp. rate (!) 28, height 6\' 2"  (1.88 m), weight 107.3 kg, SpO2 99%.         Intake/Output Summary (Last 24 hours) at 07/06/2023 4098 Last data filed at 07/06/2023 1191 Gross per 24 hour  Intake 1083.95 ml  Output 5400 ml  Net -4316.05 ml   Filed Weights   07/03/23 0222 07/03/23 0430 07/05/23 1132  Weight: 110.6 kg 110.6 kg 107.3 kg    Examination: General: Elderly, does not appear to be in distress, agitated HENT: Dry oral mucosa Lungs: Clear breath sounds, reduced air movement at the bases Cardiovascular: S1-S2 appreciated Abdomen: Soft, bowel sounds appreciated Extremities: No clubbing, no edema Neuro: Awake, agitated, not following commands, somewhat redirectable.  Moving all extremities GU:   I reviewed nursing notes, Consultant notes, hospitalist notes, last 24 h vitals and pain scores, last 48 h intake and output, last 24 h labs and trends, and last 24 h imaging results.  Bone marrow biopsy 4/18-results pending  Consultants - Renal - Heme-onc - Cardiology consulted with A-fib with RVR  Resolved Hospital Problem list     Assessment & Plan:  Suspected myeloma - Presented with encephalopathy, AKI, hypocalcemia, lytic bone lesions.  Elevated immunoglobulins - Bone marrow results pending  Hypercalcemia-currently 10.5 - Did receive IV Zometa  - Received calcitonin - On scheduled Lasix   Paroxysmal atrial fibrillation with RVR - Cardizem  drip started - Will give 1 dose of amiodarone -EKG with QTc 479 -Needs close monitoring - Cardiology consulted  Acute kidney injury BUN 51, creatinine 4.64 - Avoid nephrotoxic medications - Maintain renal perfusion  Multilobar pneumonia -On ceftriaxone  and azithromycin   Fever -on  antibiotics -cultures 4/19- no growth  Anemia H/H 7/25 - Monitor - Will likely need transfusion - Had had 3 units of packed red cells prior to transfer  Acute metabolic encephalopathy - Multifactorial -Elevated ammonia at 81 -We will placed cortrak  Hypertension - On IV hydralazine  and labetalol  as  needed -Clonidine  patch DC'd as blood pressure did come down and currently on Cardizem  drip  History of obstructive sleep apnea - CPAP at night  History of seizure disorder - Continue seizure precautions - Was on the Depakote   Hypoalbuminemia - Continue to monitor - Cortrak will be placed - Will start enteric nutrition once able to - Will also be able to start him on lactulose  once able to replace cortrak  Type 2 diabetes - Continue SSI  Tobacco use disorder  Started lactulose  PR IR for Cortrak in AM  Best Practice (right click and "Reselect all SmartList Selections" daily)   Diet/type: NPO DVT prophylaxis SCD Pressure ulcer(s): N/A GI prophylaxis: N/A Lines: N/A Foley:  N/A Code Status:  full code Last date of multidisciplinary goals of care discussion [was discussed with spouse at bedside 07/05/2023]  Labs   CBC: Recent Labs  Lab 07/03/23 0450 07/04/23 0547 07/05/23 0750 07/06/23 0305  WBC 4.3 5.3 8.6 10.1  NEUTROABS 1.4* 1.2* 4.6 5.5  HGB 7.2* 7.5* 7.2* 7.0*  HCT 24.6* 26.1* 24.9* 24.9*  MCV 92.8 93.5 93.6 92.2  PLT 154 154 148* 150    Basic Metabolic Panel: Recent Labs  Lab 07/03/23 0505 07/04/23 0547 07/05/23 0750 07/06/23 0305  NA 135 138 141 141  K 3.6 3.5 3.5 3.0*  CL 112* 113* 114* 115*  CO2 20* 22 21* 21*  GLUCOSE 104* 87 130* 137*  BUN 34* 34* 40* 51*  CREATININE 4.32* 4.10* 4.34* 4.64*  CALCIUM  9.9 10.5* 9.3 8.6*  MG 1.8 1.6* 1.4* 1.8  PHOS 3.8  --   --   --    GFR: Estimated Creatinine Clearance: 22.4 mL/min (A) (by C-G formula based on SCr of 4.64 mg/dL (H)). Recent Labs  Lab 07/03/23 0450 07/04/23 0547 07/05/23 0750 07/06/23 0305  PROCALCITON 0.17  --   --   --   WBC 4.3 5.3 8.6 10.1    Liver Function Tests: Recent Labs  Lab 07/03/23 0505 07/04/23 0547 07/05/23 0750 07/06/23 0305  AST 14* 12* 30 27  ALT 5 8 12 13   ALKPHOS 31* 31* 34* 30*  BILITOT 0.5 0.5 0.7 0.7  PROT >12.0* >12.0* >12.0* >12.0*  ALBUMIN  <1.5*  <1.5* <1.5* 1.5*   No results for input(s): "LIPASE", "AMYLASE" in the last 168 hours. Recent Labs  Lab 07/05/23 1716  AMMONIA 81*    ABG    Component Value Date/Time   HCO3 26.1 01/29/2023 1044   TCO2 28 01/29/2023 1044   O2SAT 93 01/29/2023 1044     Coagulation Profile: Recent Labs  Lab 07/05/23 2227  INR 2.1*    Cardiac Enzymes: No results for input(s): "CKTOTAL", "CKMB", "CKMBINDEX", "TROPONINI" in the last 168 hours.  HbA1C: Hgb A1c MFr Bld  Date/Time Value Ref Range Status  01/29/2023 02:40 PM 6.7 (H) 4.8 - 5.6 % Final    Comment:    (NOTE) Pre diabetes:          5.7%-6.4%  Diabetes:              >6.4%  Glycemic control for   <7.0% adults with diabetes   11/12/2021 11:30 AM 6.5 (H) 4.8 - 5.6 % Final  Comment:    (NOTE) Pre diabetes:          5.7%-6.4%  Diabetes:              >6.4%  Glycemic control for   <7.0% adults with diabetes     CBG: Recent Labs  Lab 07/05/23 0727 07/05/23 1149 07/05/23 1626 07/05/23 2341 07/06/23 0747  GLUCAP 118* 158* 167* 123* 187*    Review of Systems:   Unable to provide history  Past Medical History:  He,  has a past medical history of Anxiety, Arthritis, Asthma, Bipolar disorder (HCC), Current every day smoker, Depression, Diabetes mellitus, type II (HCC), Dyspnea, History of kidney stones, History of pneumonia, Hyperlipidemia, Hypertension, Morbid obesity (HCC), Sedentary lifestyle, Seizures (HCC), and Sleep apnea.   Surgical History:   Past Surgical History:  Procedure Laterality Date   CARDIAC CATHETERIZATION  2020   carpal tunel Left    COLONOSCOPY     HARDWARE REMOVAL Left 07/26/2022   Procedure: HARDWARE REMOVAL ELBOW;  Surgeon: Laneta Pintos, MD;  Location: [facility] OR;  Service: Orthopedics;  Laterality: Left;   ORIF HUMERUS FRACTURE Left 01/16/2022   Procedure: OPEN REDUCTION INTERNAL FIXATION (ORIF) DISTAL HUMERUS FRACTURE;  Surgeon: Laneta Pintos, MD;  Location: [facility] OR;  Service: Orthopedics;   Laterality: Left;   ORIF HUMERUS FRACTURE Left 01/29/2023   Procedure: OPEN REDUCTION INTERNAL FIXATION (ORIF) DISTAL HUMERUS FRACTURE;  Surgeon: Laneta Pintos, MD;  Location: [facility] OR;  Service: Orthopedics;  Laterality: Left;   OTHER SURGICAL HISTORY     R & L shoulder   OTHER SURGICAL HISTORY Right    Knee surgery x several   TOTAL HIP ARTHROPLASTY Left 12/26/2017   Procedure: LEFT TOTAL HIP ARTHROPLASTY ANTERIOR APPROACH;  Surgeon: Arnie Lao, MD;  Location: [facility] ORS;  Service: Orthopedics;  Laterality: Left;   TOTAL KNEE ARTHROPLASTY Left 11/23/2021   Procedure: LEFT TOTAL KNEE ARTHROPLASTY;  Surgeon: Arnie Lao, MD;  Location: [facility] OR[facility];  Service: Orthopedics;  Laterality: Left;     Social History:   reports that he has been smoking cigarettes. He has a 12.5 pack-year smoking history. He has never used smokeless tobacco. He reports current drug use. Drug: Marijuana. He reports that he does not drink alcohol.   Family History:  His family history includes Diabetes in his brother, father, mother, and sister; Heart attack in his father and mother; Hypertension in his brother, father, mother, and sister; Osteoporosis in his mother.   Allergies Allergies  Allergen Reactions   Morphine And Codeine Anaphylaxis   Shellfish Allergy Anaphylaxis   Betadine  [Povidone Iodine ] Itching   Bupropion Other (See Comments)    Shaking of the body, hallucinations    Chlorhexidine  Hives    Patient reports never had issues CHG with mouth rinse.   Influenza Vaccines Hives   Metoprolol  Rash     Home Medications  Prior to Admission medications   Medication Sig Start Date End Date Taking? Authorizing Provider  albuterol  (PROVENTIL  HFA;VENTOLIN  HFA) 108 (90 Base) MCG/ACT inhaler Inhale 2 puffs into the lungs every 6 (six) hours as needed for wheezing or shortness of breath.   Yes [provider]  albuterol  (PROVENTIL ) (2.5 MG/3ML) 0.083% nebulizer solution 2.5 mg every 2  (two) hours as needed for wheezing or shortness of breath.   Yes [provider]  aspirin  EC 81 MG tablet Take 81 mg by mouth daily. Swallow whole.   Yes [provider]  busPIRone  (BUSPAR ) 15 MG tablet Take  15 mg by mouth at bedtime.   Yes [provider]  celecoxib  (CELEBREX ) 200 MG capsule Take 200 mg by mouth daily.   Yes [provider]  clonazePAM (KLONOPIN) 1 MG tablet Take 1 mg by mouth once as needed for anxiety.   Yes [provider]  cyclobenzaprine  (FLEXERIL ) 5 MG tablet Take 1 tablet (5 mg total) by mouth 3 (three) times daily as needed for muscle spasms. Patient taking differently: Take 5 mg by mouth daily as needed for muscle spasms. 01/30/23  Yes Versie Gores, PA-C  divalproex  (DEPAKOTE ) 500 MG DR tablet Take 500 mg by mouth 2 (two) times daily.   Yes [provider]  EPINEPHrine  0.3 mg/0.3 mL IJ SOAJ injection Inject 0.3 mg into the muscle as needed for anaphylaxis. 08/28/21  Yes [provider]  esomeprazole (NEXIUM) 40 MG capsule Take 40 mg by mouth in the morning.   Yes [provider]  Fluticasone -Salmeterol (ADVAIR) 500-50 MCG/DOSE AEPB Inhale 2 puffs into the lungs in the morning.   Yes [provider]  furosemide  (LASIX ) 40 MG tablet Take 40 mg by mouth daily as needed for fluid or edema.   Yes [provider]  gabapentin  (NEURONTIN ) 300 MG capsule Take 300-600 mg by mouth See admin instructions. 300 mg in the morning, 600 mg at bedtime   Yes [provider]  hydrALAZINE  (APRESOLINE ) 10 MG tablet Take 10 mg by mouth in the morning and at bedtime.   Yes [provider]  hydrochlorothiazide  (HYDRODIURIL ) 25 MG tablet Take 25 mg by mouth daily.   Yes [provider]  lisinopril  (ZESTRIL ) 40 MG tablet Take 40 mg by mouth daily. 03/25/23  Yes [provider]  metFORMIN  (GLUCOPHAGE ) 1000 MG tablet Take 1,000 mg by mouth 2 (two) times daily with a meal.   Yes  [provider]  montelukast  (SINGULAIR ) 10 MG tablet Take 10 mg by mouth at bedtime.   Yes [provider]  OVER THE COUNTER MEDICATION Take 400 mg by mouth at bedtime. Burdock root   Yes [provider]  oxyCODONE  (ROXICODONE ) 5 MG immediate release tablet Take 1 tablet (5 mg total) by mouth every 4 (four) hours as needed for severe pain (pain score 7-10). 01/30/23  Yes McClung, Sarah A, PA-C  PARoxetine  (PAXIL ) 40 MG tablet Take 40 mg by mouth every morning.   Yes [provider]  spironolactone  (ALDACTONE ) 25 MG tablet Take 25 mg by mouth at bedtime. 10/07/21  Yes [provider]  tamsulosin  (FLOMAX ) 0.4 MG CAPS capsule Take 0.4 mg by mouth at bedtime. 10/07/21  Yes [provider]  testosterone  cypionate (DEPOTESTOSTERONE CYPIONATE) 200 MG/ML injection Inject 0.5 mLs (100 mg total) into the muscle every 14 (fourteen) days. 09/17/16  Yes Nida, Gebreselassie W, MD  verapamil  (CALAN -SR) 120 MG CR tablet Take 120 mg by mouth daily.   Yes [provider]  Vitamin D , Ergocalciferol , (DRISDOL ) 1.25 MG (50000 UNIT) CAPS capsule Take 1 capsule (50,000 Units total) by mouth every Thursday. Patient taking differently: Take 50,000 Units by mouth once a week. Tuesday 02/06/23  Yes Versie Gores, PA-C  buPROPion (WELLBUTRIN XL) 150 MG 24 hr tablet Take 150 mg by mouth daily. Patient not taking: Reported on 07/04/2023    [provider]    The patient is critically ill with multiple organ systems failure and requires high complexity decision making for assessment and support, frequent evaluation and titration of therapies, application of advanced monitoring technologies  and extensive interpretation of multiple databases. Critical Care Time devoted to patient care services described in this note independent of APP/resident time (if applicable)  is 35 minutes.   Myer Artis MD [facility] Personal pager: See Amion If  unanswered, please page CCM On-call: #[phone]

## 2023-07-06 NOTE — Progress Notes (Signed)
 Attempted cortrak placement x2 bilateral nares, tracing indicated tube was going into the lungs vs GI tract. MD notified.

## 2023-07-07 ENCOUNTER — Inpatient Hospital Stay (HOSPITAL_COMMUNITY)

## 2023-07-07 ENCOUNTER — Encounter (HOSPITAL_COMMUNITY): Payer: Self-pay | Admitting: Hematology & Oncology

## 2023-07-07 DIAGNOSIS — R509 Fever, unspecified: Secondary | ICD-10-CM | POA: Diagnosis not present

## 2023-07-07 DIAGNOSIS — I4891 Unspecified atrial fibrillation: Secondary | ICD-10-CM | POA: Diagnosis not present

## 2023-07-07 DIAGNOSIS — C9000 Multiple myeloma not having achieved remission: Secondary | ICD-10-CM | POA: Diagnosis not present

## 2023-07-07 DIAGNOSIS — R531 Weakness: Secondary | ICD-10-CM | POA: Diagnosis not present

## 2023-07-07 DIAGNOSIS — N179 Acute kidney failure, unspecified: Secondary | ICD-10-CM

## 2023-07-07 DIAGNOSIS — N170 Acute kidney failure with tubular necrosis: Secondary | ICD-10-CM | POA: Insufficient documentation

## 2023-07-07 DIAGNOSIS — G9340 Encephalopathy, unspecified: Secondary | ICD-10-CM | POA: Diagnosis not present

## 2023-07-07 DIAGNOSIS — I480 Paroxysmal atrial fibrillation: Secondary | ICD-10-CM | POA: Diagnosis not present

## 2023-07-07 LAB — CBC WITH DIFFERENTIAL/PLATELET
Abs Immature Granulocytes: 0.78 10*3/uL — ABNORMAL HIGH (ref 0.00–0.07)
Basophils Absolute: 0 10*3/uL (ref 0.0–0.1)
Basophils Relative: 0 %
Eosinophils Absolute: 0 10*3/uL (ref 0.0–0.5)
Eosinophils Relative: 0 %
HCT: 22.2 % — ABNORMAL LOW (ref 39.0–52.0)
Hemoglobin: 6.3 g/dL — CL (ref 13.0–17.0)
Immature Granulocytes: 6 %
Lymphocytes Relative: 35 %
Lymphs Abs: 4.9 10*3/uL — ABNORMAL HIGH (ref 0.7–4.0)
MCH: 26.8 pg (ref 26.0–34.0)
MCHC: 28.4 g/dL — ABNORMAL LOW (ref 30.0–36.0)
MCV: 94.5 fL (ref 80.0–100.0)
Monocytes Absolute: 3.2 10*3/uL — ABNORMAL HIGH (ref 0.1–1.0)
Monocytes Relative: 24 %
Neutro Abs: 4.8 10*3/uL (ref 1.7–7.7)
Neutrophils Relative %: 35 %
Platelets: 140 10*3/uL — ABNORMAL LOW (ref 150–400)
RBC: 2.35 MIL/uL — ABNORMAL LOW (ref 4.22–5.81)
RDW: 27.6 % — ABNORMAL HIGH (ref 11.5–15.5)
WBC: 13.7 10*3/uL — ABNORMAL HIGH (ref 4.0–10.5)
nRBC: 1.8 % — ABNORMAL HIGH (ref 0.0–0.2)

## 2023-07-07 LAB — COMPREHENSIVE METABOLIC PANEL WITH GFR
ALT: 11 U/L (ref 0–44)
AST: 13 U/L — ABNORMAL LOW (ref 15–41)
Albumin: <1.5 g/dL — ABNORMAL LOW (ref 3.5–5.0)
Alkaline Phosphatase: 31 U/L — ABNORMAL LOW (ref 38–126)
Anion gap: 5 (ref 5–15)
BUN: 63 mg/dL — ABNORMAL HIGH (ref 6–20)
CO2: 22 mmol/L (ref 22–32)
Calcium: 8.1 mg/dL — ABNORMAL LOW (ref 8.9–10.3)
Chloride: 117 mmol/L — ABNORMAL HIGH (ref 98–111)
Creatinine, Ser: 4.83 mg/dL — ABNORMAL HIGH (ref 0.61–1.24)
GFR, Estimated: 13 mL/min — ABNORMAL LOW (ref >60)
Glucose, Bld: 175 mg/dL — ABNORMAL HIGH (ref 70–99)
Potassium: 2.9 mmol/L — ABNORMAL LOW (ref 3.5–5.1)
Sodium: 144 mmol/L (ref 135–145)
Total Bilirubin: 0.5 mg/dL (ref 0.0–1.2)
Total Protein: >12 g/dL — ABNORMAL HIGH (ref 6.5–8.1)

## 2023-07-07 LAB — ECHOCARDIOGRAM COMPLETE
AR max vel: 2.04 cm2
AV Area VTI: 2.25 cm2
AV Area mean vel: 2.04 cm2
AV Mean grad: 8 mmHg
AV Peak grad: 16 mmHg
Ao pk vel: 2 m/s
Area-P 1/2: 5.7 cm2
Height: 74 in
MV VTI: 2.31 cm2
S' Lateral: 3.6 cm
Single Plane A4C EF: 70.2 %
Weight: 3784.86 [oz_av]

## 2023-07-07 LAB — DIC (DISSEMINATED INTRAVASCULAR COAGULATION)PANEL
D-Dimer, Quant: 0.93 ug{FEU}/mL — ABNORMAL HIGH (ref 0.00–0.50)
Fibrinogen: 146 mg/dL — ABNORMAL LOW (ref 210–475)
INR: 1.9 — ABNORMAL HIGH (ref 0.8–1.2)
Platelets: 137 10*3/uL — ABNORMAL LOW (ref 150–400)
Prothrombin Time: 22.3 s — ABNORMAL HIGH (ref 11.4–15.2)
Smear Review: NONE SEEN
aPTT: 44 s — ABNORMAL HIGH (ref 24–36)

## 2023-07-07 LAB — GLUCOSE, CAPILLARY
Glucose-Capillary: 162 mg/dL — ABNORMAL HIGH (ref 70–99)
Glucose-Capillary: 164 mg/dL — ABNORMAL HIGH (ref 70–99)
Glucose-Capillary: 167 mg/dL — ABNORMAL HIGH (ref 70–99)
Glucose-Capillary: 179 mg/dL — ABNORMAL HIGH (ref 70–99)
Glucose-Capillary: 156 mg/dL — ABNORMAL HIGH (ref 70–99)

## 2023-07-07 LAB — MAGNESIUM: Magnesium: 2 mg/dL (ref 1.7–2.4)

## 2023-07-07 LAB — KAPPA/LAMBDA LIGHT CHAINS
Kappa free light chain: 108.3 mg/L — ABNORMAL HIGH (ref 3.3–19.4)
Kappa, lambda light chain ratio: 13.54 — ABNORMAL HIGH (ref 0.26–1.65)
Lambda free light chains: 8 mg/L (ref 5.7–26.3)

## 2023-07-07 LAB — AMMONIA: Ammonia: 70 umol/L — ABNORMAL HIGH (ref 9–35)

## 2023-07-07 LAB — PREPARE RBC (CROSSMATCH)

## 2023-07-07 LAB — HEPARIN LEVEL (UNFRACTIONATED)
Heparin Unfractionated: 0.29 [IU]/mL — ABNORMAL LOW (ref 0.30–0.70)
Heparin Unfractionated: 0.11 [IU]/mL — ABNORMAL LOW (ref 0.30–0.70)

## 2023-07-07 LAB — HEMOGLOBIN AND HEMATOCRIT, BLOOD
HCT: 23 % — ABNORMAL LOW (ref 39.0–52.0)
Hemoglobin: 6.9 g/dL — CL (ref 13.0–17.0)

## 2023-07-07 MED ORDER — ACETAMINOPHEN 650 MG RE SUPP: 650.0000 mg | RECTAL | Status: DC | PRN

## 2023-07-07 MED ORDER — POTASSIUM CHLORIDE 20 MEQ PO PACK
40.0000 meq | PACK | ORAL | Status: AC
  Administered 2023-07-07: 40 meq via ORAL
  Filled 2023-07-07 (×2): qty 2

## 2023-07-07 MED ORDER — POTASSIUM CHLORIDE 10 MEQ/100ML IV SOLN
10.0000 meq | INTRAVENOUS | Status: AC
  Administered 2023-07-07 (×4): 10 meq via INTRAVENOUS
  Filled 2023-07-07 (×4): qty 100

## 2023-07-07 MED ORDER — LIDOCAINE VISCOUS HCL 2 % MT SOLN
15.0000 mL | Freq: Once | OROMUCOSAL | Status: AC
  Administered 2023-07-07: 15 mL via OROMUCOSAL

## 2023-07-07 MED ORDER — INSULIN ASPART 100 UNIT/ML IJ SOLN
0.0000 [IU] | INTRAMUSCULAR | Status: DC
  Administered 2023-07-07 – 2023-07-08 (×5): 2 [IU] via SUBCUTANEOUS

## 2023-07-07 MED ORDER — FAMOTIDINE IN NACL 20-0.9 MG/50ML-% IV SOLN: 20.0000 mg | INTRAVENOUS | Status: DC

## 2023-07-07 MED ORDER — OXYCODONE HCL 5 MG PO TABS
5.0000 mg | ORAL_TABLET | Freq: Four times a day (QID) | ORAL | Status: DC | PRN
  Administered 2023-07-08 – 2023-07-17 (×2): 5 mg
  Filled 2023-07-07 (×2): qty 1

## 2023-07-07 MED ORDER — ACETAMINOPHEN 160 MG/5ML PO SOLN
650.0000 mg | Freq: Four times a day (QID) | ORAL | Status: DC | PRN
  Administered 2023-07-07 – 2023-08-09 (×38): 650 mg
  Filled 2023-07-07 (×38): qty 20.3

## 2023-07-07 MED ORDER — PERFLUTREN LIPID MICROSPHERE
1.0000 mL | INTRAVENOUS | Status: AC | PRN
  Administered 2023-07-07: 2 mL via INTRAVENOUS

## 2023-07-07 MED ORDER — BUTAMBEN-TETRACAINE-BENZOCAINE 2-2-14 % EX AERO
1.0000 | INHALATION_SPRAY | Freq: Once | CUTANEOUS | Status: AC
  Administered 2023-07-07: 1 via TOPICAL
  Filled 2023-07-07: qty 20

## 2023-07-07 MED ORDER — MIDAZOLAM HCL 2 MG/2ML IJ SOLN: 1.0000 mg | Freq: Once | INTRAMUSCULAR | Status: AC

## 2023-07-07 MED ORDER — VITAL HIGH PROTEIN PO LIQD
1000.0000 mL | ORAL | Status: DC
  Administered 2023-07-07: 1000 mL

## 2023-07-07 MED ORDER — MIDAZOLAM HCL 2 MG/2ML IJ SOLN
INTRAMUSCULAR | Status: AC
Start: 2023-07-07 — End: 2023-07-07
  Administered 2023-07-07: 1 mg via INTRAVENOUS
  Filled 2023-07-07: qty 2

## 2023-07-07 MED ORDER — SODIUM CHLORIDE 0.9% IV SOLUTION: Freq: Once | INTRAVENOUS | Status: AC

## 2023-07-07 MED ORDER — ENSURE ENLIVE PO LIQD: 237.0000 mL | Freq: Two times a day (BID) | ORAL | Status: DC

## 2023-07-07 MED ORDER — FENTANYL CITRATE PF 50 MCG/ML IJ SOSY
PREFILLED_SYRINGE | INTRAMUSCULAR | Status: AC
Start: 2023-07-07 — End: 2023-07-07
  Administered 2023-07-07: 25 ug via INTRAVENOUS
  Filled 2023-07-07: qty 1

## 2023-07-07 MED ORDER — HALOPERIDOL LACTATE 5 MG/ML IJ SOLN
5.0000 mg | Freq: Once | INTRAMUSCULAR | Status: AC | PRN
  Administered 2023-07-07: 5 mg via INTRAVENOUS
  Filled 2023-07-07: qty 1

## 2023-07-07 MED ORDER — FENTANYL CITRATE PF 50 MCG/ML IJ SOSY: 25.0000 ug | PREFILLED_SYRINGE | Freq: Once | INTRAMUSCULAR | Status: AC

## 2023-07-07 NOTE — Progress Notes (Signed)
 Consent obtained from spouse Arlind Klingerman for blood transfusion.  Frank [name] denies any questions/concerns related to blood transfusion and does not think pt has had a reaction to blood transfusion in the past.  Daiva Duane RN as witness to consent on speakerphone

## 2023-07-07 NOTE — Progress Notes (Signed)
 Speech Language Pathology Treatment: Dysphagia  Patient Details Name: Frank [name]. MRN: [id number] DOB: 05/17/1965 Today's Date: 07/07/2023 Time: 8119-1478 SLP Time Calculation (min) (ACUTE ONLY): 11 min  Assessment / Plan / Recommendation Clinical Impression  There is no meaningful improvement in mental status that would allow for POs. Pt alerted briefly to name, sternal rub, opening eyes then closing them again. Followed single command to open mouth; no other commands. Accepted ice chips, one 1/2 teaspoon of water  with no active effort, eventual reflexive swallow followed by immediate coughing. Oral suctioning removed secretions/blood, present after unsuccessful attempt at NG placement in IR.   Dr Marygrace Snellen arrived to place Dobhoff at bedside.  SLP will continue to follow and await improvements in MS to allow for PO trials. D/W RN.   HPI HPI: 58 yo who initially presented at [facility] 4/13 due to AMS, admitted with sepsis secondary to CAP,  acute metabolic encephalopathy secondary to sepsis, AKI, hypercalcemia, symptomatic anemia requiring 3 unit PRBCs, and lytic bone lesions with concern for multiple myeloma. He was transferred to [facility] for further w/u of suspected multiple myeloma.     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  Recommendations may be updated based on patient status, additional functional criteria and insurance authorization.    Recommendations  Diet recommendations: NPO Medication Administration: Via alternative means                  Oral care QID     Dysphagia, unspecified (R13.10)     Continue with  current plan of care   Yardley Beltran L. Beatris Lincoln, MA CCC/SLP Clinical Specialist - Acute Care SLP Acute Rehabilitation Services Office number [phone]   [name]  07/07/2023, 2:44 PM

## 2023-07-07 NOTE — Progress Notes (Addendum)
 NAME:  Frank Pabst., MRN:  [id number], DOB:  November 10, 1965, LOS: 4 ADMISSION DATE:  07/03/2023, CONSULTATION DATE:  07/06/23 REFERRING MD:  Dr Maury Space, CHIEF COMPLAINT:  Encephalopathy   History of Present Illness:  Asked to see patient for encephalopathy   Transferred from [facility] with 1 week of generalized weakness, altered mentation, decreased appetite Recently being worked up for multiple myeloma.  Transferred to [facility] for oncology evaluation Nephrology consulted for AKI   Background history of obesity, asthma, obstructive sleep apnea on CPAP GERD, hypertension, hyperlipidemia Has been feeling weak for the last few days according to spouse Admitted with concern for sepsis, community-acquired pneumonia, antibiotic encephalopathy Workup revealed lytic bony lesions with concern for multiple myeloma Transfused with 3 units packed red blood cells Evaluation so far-bone marrow 07/04/2023  Pertinent  Medical History   Past Medical History:  Diagnosis Date   Anxiety    Arthritis    Asthma    Bipolar disorder (HCC)    Current every day smoker    Depression    Diabetes mellitus, type II (HCC)    Dyspnea    History of kidney stones    History of pneumonia    Hyperlipidemia    Hypertension    Morbid obesity (HCC)    Sedentary lifestyle    Seizures (HCC)    last seizure 12 yrs ago, no current problem   Sleep apnea    uses CPAP nightly   Significant Hospital Events: Including procedures, antibiotic start and stop dates in addition to other pertinent events   4/19-blood cultures, MRSA PCR negative 4/19 chest x-ray with mild pulmonary congestion 4/20 atrial fibrillation with RVR 4/21 unchanged  Interim History / Subjective:  Patient still encephalopathy, not oriented or following commands but is laert A-fib with RVR, on Cardizem  drip and amio drip, unable to take PO, awaiting IR cortrak (bedside failed 4/20)  Objective   Blood pressure (!) 139/94, pulse (!) 130,  temperature 97.6 F (36.4 C), temperature source Oral, resp. rate (!) 26, height 6\' 2"  (1.88 m), weight 107.3 kg, SpO2 93%.        Intake/Output Summary (Last 24 hours) at 07/07/2023 1050 Last data filed at 07/07/2023 1043 Gross per 24 hour  Intake 4676.14 ml  Output 2960 ml  Net 1716.14 ml   Filed Weights   07/03/23 0222 07/03/23 0430 07/05/23 1132  Weight: 110.6 kg 110.6 kg 107.3 kg    Examination: General: Appears older than stated age, does not appear to be in distress, lying in bed Lungs: Normal work of breathing Cardiovascular: No lower extremity edema noted Abdomen: Nondistended Extremities: No clubbing, no edema Neuro: Awake, not following commands, somewhat redirectable.  Moving all extremities   Resolved Hospital Problem list     Assessment & Plan:  Suspected myeloma - Presented with encephalopathy, AKI, hypocalcemia, lytic bone lesions.  Elevated immunoglobulins - Bone marrow results pending - Appreciate hematology/oncology assistance  Hypercalcemia-resolved - Did receive IV Zometa  - Received calcitonin - s/p lasix   Paroxysmal atrial fibrillation with RVR - Cardizem  drip, consider convert to PO once cortrak in place - Continue amio drip - Continue heparin  drip - Cardiology consulted, appreciate assistance  Acute kidney injury: Creatinine, BUN slowly rising but urine output appears to be picking up which is encouraging - Avoid nephrotoxic medications - Nephrology consult, appreciate assistance  Multilobar pneumonia -s/p azithromycin , continue CTX 5 days planned  Fever: Related to pneumonia, possible myeloma. -on antibiotics -cultures 4/19- no growth  Anemia: Related to myeloma,  lab checks, prior hydration with hypercalcemia - Monitor - Repeat transfusion x 2 4/21 - Had had 3 units of packed red cells prior to transfer  Acute metabolic encephalopathy - Multifactorial, sepsis, hypercalcemia, myeloma - Core track to start feeds, needs IR placement  failed bedside 4/20 - Start lactulose  per tube once core track in place, ammonia was elevated  Hypertension - On IV hydralazine  and labetalol  as needed  History of obstructive sleep apnea - CPAP at night once encephalopathy improves  History of seizure disorder - Continue seizure precautions - Continue IV Depakote , can switch to orals once gastric access obtained  Hypoalbuminemia - Continue to monitor - Cortrak will be placed - Will start enteric nutrition once able to  Type 2 diabetes - Continue SSI  There is no true ICU need.  He is stepdown status.  Likely can try transition back to [facility] 4/22, PCCM available as needed  Best Practice (right click and "Reselect all SmartList Selections" daily)   Diet/type: NPO DVT prophylaxis SCD Pressure ulcer(s): N/A GI prophylaxis: N/A Lines: N/A Foley:  N/A Code Status:  full code Last date of multidisciplinary goals of care discussion [was discussed with spouse at bedside 07/05/2023]  Labs   CBC: Recent Labs  Lab 07/03/23 0450 07/04/23 0547 07/05/23 0750 07/06/23 0305 07/07/23 0339  WBC 4.3 5.3 8.6 10.1 13.7*  NEUTROABS 1.4* 1.2* 4.6 5.5 4.8  HGB 7.2* 7.5* 7.2* 7.0* 6.3*  HCT 24.6* 26.1* 24.9* 24.9* 22.2*  MCV 92.8 93.5 93.6 92.2 94.5  PLT 154 154 148* 150 140*    Basic Metabolic Panel: Recent Labs  Lab 07/03/23 0505 07/04/23 0547 07/05/23 0750 07/06/23 0305 07/07/23 0339  NA 135 138 141 141 144  K 3.6 3.5 3.5 3.0* 2.9*  CL 112* 113* 114* 115* 117*  CO2 20* 22 21* 21* 22  GLUCOSE 104* 87 130* 137* 175*  BUN 34* 34* 40* 51* 63*  CREATININE 4.32* 4.10* 4.34* 4.64* 4.83*  CALCIUM  9.9 10.5* 9.3 8.6* 8.1*  MG 1.8 1.6* 1.4* 1.8 2.0  PHOS 3.8  --   --   --   --    GFR: Estimated Creatinine Clearance: 21.5 mL/min (A) (by C-G formula based on SCr of 4.83 mg/dL (H)). Recent Labs  Lab 07/03/23 0450 07/04/23 0547 07/05/23 0750 07/06/23 0305 07/07/23 0339  PROCALCITON 0.17  --   --   --   --   WBC 4.3 5.3 8.6  10.1 13.7*    Liver Function Tests: Recent Labs  Lab 07/03/23 0505 07/04/23 0547 07/05/23 0750 07/06/23 0305 07/07/23 0339  AST 14* 12* 30 27 13*  ALT 5 8 12 13 11   ALKPHOS 31* 31* 34* 30* 31*  BILITOT 0.5 0.5 0.7 0.7 0.5  PROT >12.0* >12.0* >12.0* >12.0* >12.0*  ALBUMIN  <1.5* <1.5* <1.5* 1.5* <1.5*   No results for input(s): "LIPASE", "AMYLASE" in the last 168 hours. Recent Labs  Lab 07/05/23 1716 07/07/23 0339  AMMONIA 81* 70*    ABG    Component Value Date/Time   HCO3 26.1 01/29/2023 1044   TCO2 28 01/29/2023 1044   O2SAT 93 01/29/2023 1044     Coagulation Profile: Recent Labs  Lab 07/05/23 2227  INR 2.1*    Cardiac Enzymes: No results for input(s): "CKTOTAL", "CKMB", "CKMBINDEX", "TROPONINI" in the last 168 hours.  HbA1C: Hgb A1c MFr Bld  Date/Time Value Ref Range Status  01/29/2023 02:40 PM 6.7 (H) 4.8 - 5.6 % Final    Comment:    (NOTE) Pre  diabetes:          5.7%-6.4%  Diabetes:              >6.4%  Glycemic control for   <7.0% adults with diabetes   11/12/2021 11:30 AM 6.5 (H) 4.8 - 5.6 % Final    Comment:    (NOTE) Pre diabetes:          5.7%-6.4%  Diabetes:              >6.4%  Glycemic control for   <7.0% adults with diabetes     CBG: Recent Labs  Lab 07/06/23 1137 07/06/23 1621 07/06/23 2207 07/07/23 0416 07/07/23 0809  GLUCAP 228* 186* 186* 162* 164*    Review of Systems:   Unable to provide history  Past Medical History:  He,  has a past medical history of Anxiety, Arthritis, Asthma, Bipolar disorder (HCC), Current every day smoker, Depression, Diabetes mellitus, type II (HCC), Dyspnea, History of kidney stones, History of pneumonia, Hyperlipidemia, Hypertension, Morbid obesity (HCC), Sedentary lifestyle, Seizures (HCC), and Sleep apnea.   Surgical History:   Past Surgical History:  Procedure Laterality Date   CARDIAC CATHETERIZATION  2020   carpal tunel Left    COLONOSCOPY     HARDWARE REMOVAL Left 07/26/2022    Procedure: HARDWARE REMOVAL ELBOW;  Surgeon: Laneta Pintos, MD;  Location: [facility] OR;  Service: Orthopedics;  Laterality: Left;   ORIF HUMERUS FRACTURE Left 01/16/2022   Procedure: OPEN REDUCTION INTERNAL FIXATION (ORIF) DISTAL HUMERUS FRACTURE;  Surgeon: Laneta Pintos, MD;  Location: [facility] OR;  Service: Orthopedics;  Laterality: Left;   ORIF HUMERUS FRACTURE Left 01/29/2023   Procedure: OPEN REDUCTION INTERNAL FIXATION (ORIF) DISTAL HUMERUS FRACTURE;  Surgeon: Laneta Pintos, MD;  Location: [facility] OR;  Service: Orthopedics;  Laterality: Left;   OTHER SURGICAL HISTORY     R & L shoulder   OTHER SURGICAL HISTORY Right    Knee surgery x several   TOTAL HIP ARTHROPLASTY Left 12/26/2017   Procedure: LEFT TOTAL HIP ARTHROPLASTY ANTERIOR APPROACH;  Surgeon: Arnie Lao, MD;  Location: [facility] ORS;  Service: Orthopedics;  Laterality: Left;   TOTAL KNEE ARTHROPLASTY Left 11/23/2021   Procedure: LEFT TOTAL KNEE ARTHROPLASTY;  Surgeon: Arnie Lao, MD;  Location: [facility] OR[facility];  Service: Orthopedics;  Laterality: Left;     Social History:   reports that he has been smoking cigarettes. He has a 12.5 pack-year smoking history. He has never used smokeless tobacco. He reports current drug use. Drug: Marijuana. He reports that he does not drink alcohol.   Family History:  His family history includes Diabetes in his brother, father, mother, and sister; Heart attack in his father and mother; Hypertension in his brother, father, mother, and sister; Osteoporosis in his mother.   Allergies Allergies  Allergen Reactions   Morphine And Codeine Anaphylaxis   Shellfish Allergy Anaphylaxis   Betadine  [Povidone Iodine ] Itching   Bupropion Other (See Comments)    Shaking of the body, hallucinations    Chlorhexidine  Hives    Patient reports never had issues CHG with mouth rinse.   Influenza Vaccines Hives   Metoprolol  Rash     Home Medications  Prior to Admission medications   Medication Sig Start  Date End Date Taking? Authorizing Provider  albuterol  (PROVENTIL  HFA;VENTOLIN  HFA) 108 (90 Base) MCG/ACT inhaler Inhale 2 puffs into the lungs every 6 (six) hours as needed for wheezing or shortness of breath.   Yes [provider]  albuterol  (PROVENTIL ) (  2.5 MG/3ML) 0.083% nebulizer solution 2.5 mg every 2 (two) hours as needed for wheezing or shortness of breath.   Yes [provider]  aspirin  EC 81 MG tablet Take 81 mg by mouth daily. Swallow whole.   Yes [provider]  busPIRone  (BUSPAR ) 15 MG tablet Take 15 mg by mouth at bedtime.   Yes [provider]  celecoxib  (CELEBREX ) 200 MG capsule Take 200 mg by mouth daily.   Yes [provider]  clonazePAM (KLONOPIN) 1 MG tablet Take 1 mg by mouth once as needed for anxiety.   Yes [provider]  cyclobenzaprine  (FLEXERIL ) 5 MG tablet Take 1 tablet (5 mg total) by mouth 3 (three) times daily as needed for muscle spasms. Patient taking differently: Take 5 mg by mouth daily as needed for muscle spasms. 01/30/23  Yes Versie Gores, PA-C  divalproex  (DEPAKOTE ) 500 MG DR tablet Take 500 mg by mouth 2 (two) times daily.   Yes [provider]  EPINEPHrine  0.3 mg/0.3 mL IJ SOAJ injection Inject 0.3 mg into the muscle as needed for anaphylaxis. 08/28/21  Yes [provider]  esomeprazole (NEXIUM) 40 MG capsule Take 40 mg by mouth in the morning.   Yes [provider]  Fluticasone -Salmeterol (ADVAIR) 500-50 MCG/DOSE AEPB Inhale 2 puffs into the lungs in the morning.   Yes [provider]  furosemide  (LASIX ) 40 MG tablet Take 40 mg by mouth daily as needed for fluid or edema.   Yes [provider]  gabapentin  (NEURONTIN ) 300 MG capsule Take 300-600 mg by mouth See admin instructions. 300 mg in the morning, 600 mg at bedtime   Yes [provider]  hydrALAZINE  (APRESOLINE ) 10 MG tablet Take 10 mg by mouth in the morning and at bedtime.   Yes [provider]  hydrochlorothiazide  (HYDRODIURIL ) 25 MG tablet Take 25 mg by mouth daily.   Yes [provider]  lisinopril  (ZESTRIL ) 40 MG tablet Take 40 mg by mouth daily. 03/25/23  Yes [provider]  metFORMIN  (GLUCOPHAGE ) 1000 MG tablet Take 1,000 mg by mouth 2 (two) times daily with a meal.   Yes [provider]  montelukast  (SINGULAIR ) 10 MG tablet Take 10 mg by mouth at bedtime.   Yes [provider]  OVER THE COUNTER MEDICATION Take 400 mg by mouth at bedtime. Burdock root   Yes [provider]  oxyCODONE  (ROXICODONE ) 5 MG immediate release tablet Take 1 tablet (5 mg total) by mouth every 4 (four) hours as needed for severe pain (pain score 7-10). 01/30/23  Yes Versie Gores, PA-C  PARoxetine  (PAXIL ) 40 MG tablet Take 40 mg by mouth every morning.   Yes [provider]  spironolactone  (ALDACTONE ) 25 MG tablet Take 25 mg by mouth at bedtime. 10/07/21  Yes [provider]  tamsulosin  (FLOMAX ) 0.4 MG CAPS capsule Take 0.4 mg by mouth at bedtime. 10/07/21  Yes [provider]  testosterone  cypionate (DEPOTESTOSTERONE CYPIONATE) 200 MG/ML injection Inject 0.5 mLs (100 mg total) into the muscle every 14 (fourteen) days. 09/17/16  Yes Nida, Gebreselassie W, MD  verapamil  (CALAN -SR) 120 MG CR tablet Take 120 mg by mouth daily.   Yes [provider]  Vitamin D , Ergocalciferol , (DRISDOL ) 1.25 MG (50000 UNIT) CAPS capsule Take 1 capsule (50,000 Units total) by mouth every Thursday. Patient taking differently: Take 50,000 Units by mouth once a week. Tuesday 02/06/23  Yes Versie Gores, PA-C  buPROPion (WELLBUTRIN XL) 150 MG 24 hr tablet  Take 150 mg by mouth daily. Patient not taking: Reported on 07/04/2023    [provider]    CRITICAL CARE Performed by: Archer Kobs [place]ton Mielke   Total critical care time: 37 minutes  Critical care time was exclusive of separately billable procedures and treating other  patients.  Critical care was necessary to treat or prevent imminent or life-threatening deterioration.  Critical care was time spent personally by me on the following activities: development of treatment plan with patient and/or surrogate as well as nursing, discussions with consultants, evaluation of patient's response to treatment, examination of patient, obtaining history from patient or surrogate, ordering and performing treatments and interventions, ordering and review of laboratory studies, ordering and review of radiographic studies, pulse oximetry and re-evaluation of patient's condition.  Guerry Leek, MD [facility] Personal contact: See Amion If unanswered, please page CCM On-call: #[phone]

## 2023-07-07 NOTE — Procedures (Signed)
 Moderate Conscious Sedation Note    Frank [name]  [id number]  June 05, 1965   Date:07/07/23  Time:3:54 PM   Provider Performing:Nisa Decaire R Denee Boeder   Procedure: Moderate Conscious Sedation  During this procedure the patient is administered a total of Versed  1 mg and Fentanyl  25 mg to achieve and maintain moderate conscious sedation. The patient's heart rate, blood pressure, and oxygen saturation are monitored continuously during the procedure. The period of conscious sedation is 5 minutes, of which I was present face-to-face 100% of this time.  Weighted tip NG tube placed without difficulty to 75 cm. No color change noted. Abd XR pending to confirm placement.

## 2023-07-07 NOTE — Progress Notes (Signed)
 Rounding Note    Patient Name: Frank [name]. Date of Encounter: 07/07/2023  [facility] Cardiologist: None   Subjective   Remains in Afib with RVR. Potassium 2.9.  Renal function continues to worsen, creatinine 4.8 today.  Hemoglobin down to 6.3, given 1 unit PRBCs.  He remains confused, not answering questions or following commands  Inpatient Medications    Scheduled Meds:  feeding supplement  237 mL Oral BID BM   insulin  aspart  0-5 Units Subcutaneous QHS   insulin  aspart  0-9 Units Subcutaneous TID WC   lactulose   300 mL Rectal BID   nicotine   14 mg Transdermal Daily   mouth rinse  15 mL Mouth Rinse 4 times per day   potassium chloride   40 mEq Oral Q4H   Continuous Infusions:  amiodarone  30 mg/hr (07/07/23 1027)   cefTRIAXone  (ROCEPHIN )  IV Stopped (07/07/23 0865)   diltiazem  (CARDIZEM ) infusion 15 mg/hr (07/07/23 1027)   [START ON 07/08/2023] famotidine  (PEPCID ) IV     fluconazole  (DIFLUCAN ) IV Stopped (07/06/23 1206)   heparin  1,750 Units/hr (07/07/23 1027)   potassium chloride  10 mEq (07/07/23 1048)   valproate sodium  Stopped (07/07/23 1020)   PRN Meds: acetaminophen , acetaminophen , haloperidol  lactate, hydrALAZINE , labetalol , LORazepam , melatonin, mouth rinse, mouth rinse, oxyCODONE , perflutren  lipid microspheres (DEFINITY ) IV suspension, polyethylene glycol, prochlorperazine    Vital Signs    Vitals:   07/07/23 0900 07/07/23 0910 07/07/23 1000 07/07/23 1030  BP: (!) 141/88 (!) 154/109 (!) 139/94 (!) 146/102  Pulse: (!) 131 (!) 134 (!) 130 (!) 126  Resp: (!) 26 (!) 22 (!) 26 (!) 33  Temp:  97.6 F (36.4 C)    TempSrc:  Oral    SpO2: 94% 95% 93% 93%  Weight:      Height:        Intake/Output Summary (Last 24 hours) at 07/07/2023 1106 Last data filed at 07/07/2023 1043 Gross per 24 hour  Intake 4676.14 ml  Output 2960 ml  Net 1716.14 ml      07/05/2023   11:32 AM 07/03/2023    4:30 AM 07/03/2023    2:22 AM  Last 3 Weights  Weight (lbs)  236 lb 8.9 oz 243 lb 14.4 oz 243 lb 14.4 oz  Weight (kg) 107.3 kg 110.632 kg 110.632 kg      Telemetry    A-fib, rate 120s to 130s- Personally Reviewed  ECG    No new ECG- Personally Reviewed  Physical Exam   GEN: Ill-appearing Neck: No JVD Cardiac: Irregular, tachycardic no murmurs, rubs, or gallops.  Respiratory: Diminished breath sounds GI: Soft MS: No edema Neuro: Not answering questions or following commands Psych: Unable to assess  Labs    High Sensitivity Troponin:  No results for input(s): "TROPONINIHS" in the last 720 hours.   Chemistry Recent Labs  Lab 07/05/23 0750 07/06/23 0305 07/07/23 0339  NA 141 141 144  K 3.5 3.0* 2.9*  CL 114* 115* 117*  CO2 21* 21* 22  GLUCOSE 130* 137* 175*  BUN 40* 51* 63*  CREATININE 4.34* 4.64* 4.83*  CALCIUM  9.3 8.6* 8.1*  MG 1.4* 1.8 2.0  PROT >12.0* >12.0* >12.0*  ALBUMIN  <1.5* 1.5* <1.5*  AST 30 27 13*  ALT 12 13 11   ALKPHOS 34* 30* 31*  BILITOT 0.7 0.7 0.5  GFRNONAA 15* 14* 13*  ANIONGAP 6 5 5     Lipids No results for input(s): "CHOL", "TRIG", "HDL", "LABVLDL", "LDLCALC", "CHOLHDL" in the last 168 hours.  Hematology Recent Labs  Lab 07/05/23 0750 07/06/23 0305 07/07/23 0339  WBC 8.6 10.1 13.7*  RBC 2.66* 2.70* 2.35*  HGB 7.2* 7.0* 6.3*  HCT 24.9* 24.9* 22.2*  MCV 93.6 92.2 94.5  MCH 27.1 25.9* 26.8  MCHC 28.9* 28.1* 28.4*  RDW 26.9* 27.6* 27.6*  PLT 148* 150 140*   Thyroid No results for input(s): "TSH", "FREET4" in the last 168 hours.  BNPNo results for input(s): "BNP", "PROBNP" in the last 168 hours.  DDimer No results for input(s): "DDIMER" in the last 168 hours.   Radiology    ECHOCARDIOGRAM COMPLETE Result Date: 07/07/2023    ECHOCARDIOGRAM REPORT   Patient Name:   Frank [name]. Date of Exam: 07/07/2023 Medical Rec #:  [id number]          Height:       74.0 in Accession #:    [id number]         Weight:       236.6 lb Date of Birth:  01/06/1966          BSA:          2.334 m Patient Age:     58 years           BP:           141/88 mmHg Patient Gender: M                  HR:           110 bpm. Exam Location:  Inpatient Procedure: 3D Echo, Cardiac Doppler, Color Doppler and Intracardiac            Opacification Agent (Both Spectral and Color Flow Doppler were            utilized during procedure). Indications:    Atrial fibrillation  History:        Patient has no prior history of Echocardiogram examinations.  Sonographer:    Juanita Shaw Referring Phys: [id number] DGUYQI PARKER  Sonographer Comments: Image acquisition challenging due to patient body habitus and Image acquisition challenging due to respiratory motion. IMPRESSIONS  1. Left ventricular ejection fraction, by estimation, is 65 to 70%. The left ventricle has normal function. The left ventricle has no regional wall motion abnormalities. Left ventricular diastolic parameters are indeterminate.  2. Right ventricular systolic function is normal. The right ventricular size is normal. There is normal pulmonary artery systolic pressure. The estimated right ventricular systolic pressure is 35.8 mmHg.  3. Left atrial size was moderately dilated.  4. The mitral valve is normal in structure. No evidence of mitral valve regurgitation. No evidence of mitral stenosis.  5. The aortic valve is tricuspid. Aortic valve regurgitation is not visualized. Aortic valve sclerosis/calcification is present, without any evidence of aortic stenosis. Aortic valve mean gradient measures 8.0 mmHg.  6. Aortic dilatation noted. There is mild dilatation of the aortic root, measuring 39 mm.  7. The inferior vena cava is dilated in size with <50% respiratory variability, suggesting right atrial pressure of 15 mmHg.  8. The patient was in atrial fibrillation. FINDINGS  Left Ventricle: Left ventricular ejection fraction, by estimation, is 65 to 70%. The left ventricle has normal function. The left ventricle has no regional wall motion abnormalities. The left ventricular internal  cavity size was normal in size. There is  no left ventricular hypertrophy. Left ventricular diastolic parameters are indeterminate. Right Ventricle: The right ventricular size is normal. No increase in right ventricular wall thickness. Right ventricular systolic function is normal. There  is normal pulmonary artery systolic pressure. The tricuspid regurgitant velocity is 2.28 m/s, and  with an assumed right atrial pressure of 15 mmHg, the estimated right ventricular systolic pressure is 35.8 mmHg. Left Atrium: Left atrial size was moderately dilated. Right Atrium: Right atrial size was normal in size. Pericardium: There is no evidence of pericardial effusion. Mitral Valve: The mitral valve is normal in structure. No evidence of mitral valve regurgitation. No evidence of mitral valve stenosis. MV peak gradient, 5.4 mmHg. The mean mitral valve gradient is 2.0 mmHg. Tricuspid Valve: The tricuspid valve is normal in structure. Tricuspid valve regurgitation is trivial. Aortic Valve: The aortic valve is tricuspid. Aortic valve regurgitation is not visualized. Aortic valve sclerosis/calcification is present, without any evidence of aortic stenosis. Aortic valve mean gradient measures 8.0 mmHg. Aortic valve peak gradient measures 16.0 mmHg. Aortic valve area, by VTI measures 2.25 cm. Pulmonic Valve: The pulmonic valve was normal in structure. Pulmonic valve regurgitation is not visualized. Aorta: Aortic dilatation noted. There is mild dilatation of the aortic root, measuring 39 mm. Venous: The inferior vena cava is dilated in size with less than 50% respiratory variability, suggesting right atrial pressure of 15 mmHg. IAS/Shunts: No atrial level shunt detected by color flow Doppler.  LEFT VENTRICLE PLAX 2D LVIDd:         5.00 cm      Diastology LVIDs:         3.60 cm      LV e' medial:    7.94 cm/s LV PW:         0.60 cm      LV E/e' medial:  17.0 LV IVS:        0.90 cm      LV e' lateral:   12.90 cm/s LVOT diam:     2.00  cm      LV E/e' lateral: 10.5 LV SV:         56 LV SV Index:   24 LVOT Area:     3.14 cm  LV Volumes (MOD) LV vol d, MOD A4C: 170.0 ml LV vol s, MOD A4C: 50.6 ml LV SV MOD A4C:     170.0 ml RIGHT VENTRICLE             IVC RV Basal diam:  4.20 cm     IVC diam: 2.50 cm RV Mid diam:    2.80 cm RV S prime:     22.40 cm/s TAPSE (M-mode): 2.0 cm LEFT ATRIUM              Index        RIGHT ATRIUM           Index LA diam:        4.20 cm  1.80 cm/m   RA Area:     17.80 cm LA Vol (A2C):   106.0 ml 45.41 ml/m  RA Volume:   43.80 ml  18.76 ml/m LA Vol (A4C):   86.7 ml  37.14 ml/m LA Biplane Vol: 101.0 ml 43.27 ml/m  AORTIC VALVE                     PULMONIC VALVE AV Area (Vmax):    2.04 cm      PV Vmax:       1.42 m/s AV Area (Vmean):   2.04 cm      PV Peak grad:  8.1 mmHg AV Area (VTI):     2.25 cm AV Vmax:  200.00 cm/s AV Vmean:          125.000 cm/s AV VTI:            0.248 m AV Peak Grad:      16.0 mmHg AV Mean Grad:      8.0 mmHg LVOT Vmax:         130.00 cm/s LVOT Vmean:        81.200 cm/s LVOT VTI:          0.178 m LVOT/AV VTI ratio: 0.72  AORTA Ao Root diam: 3.90 cm Ao Asc diam:  3.30 cm MITRAL VALVE                TRICUSPID VALVE MV Area (PHT): 5.70 cm     TR Peak grad:   20.8 mmHg MV Area VTI:   2.31 cm     TR Vmax:        228.00 cm/s MV Peak grad:  5.4 mmHg MV Mean grad:  2.0 mmHg     SHUNTS MV Vmax:       1.16 m/s     Systemic VTI:  0.18 m MV Vmean:      65.4 cm/s    Systemic Diam: 2.00 cm MV Decel Time: 133 msec MV E velocity: 135.00 cm/s Dalton McleanMD Electronically signed by Archer Bear Signature Date/Time: 07/07/2023/9:53:50 AM    Final    DG CHEST PORT 1 VIEW Result Date: 07/06/2023 CLINICAL DATA:  Weakness altered EXAM: PORTABLE CHEST 1 VIEW COMPARISON:  07/05/2023 FINDINGS: Hypoventilatory changes. Cardiomegaly with mild central congestion. No consolidation, pleural effusion, or pneumothorax IMPRESSION: Cardiomegaly with mild central congestion. Electronically Signed   By: Esmeralda Hedge M.D.   On: 07/06/2023 18:17   DG CHEST PORT 1 VIEW Result Date: 07/05/2023 CLINICAL DATA:  Dyspnea EXAM: PORTABLE CHEST 1 VIEW COMPARISON:  07/04/2023 FINDINGS: Hypoventilatory change. Cardiomegaly with mild central congestion. No pleural effusion or pneumothorax. Advanced degenerative change of the shoulders. IMPRESSION: Cardiomegaly with mild central congestion. Electronically Signed   By: Esmeralda Hedge M.D.   On: 07/05/2023 16:06    Cardiac Studies     Patient Profile     58 y.o. 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 patient initially presented in to outside hospital with altered mental status and was found to have sepsis secondary to community-acquired pneumonia, acute metabolic encephalopathy secondary to sepsis, AKI, hypercalcemia, symptomatic anemia requiring 3 unit PRBCs, and lytic bone lesions.  Oncology team evaluated the patient and there was concern for new diagnosis of multiple myeloma.   Assessment & Plan    Atrial fibrillation with RVR: Likely caused by acute illness.  CHA2DS2-VASc 2 (diabetes, hypertension).  Echocardiogram with EF 65 to 70%, normal RV function, moderate left atrial enlargement, no significant valve disease, RAP 15 -Started on IV amiodarone  yesterday, was started within 24 hours of start of Afib onset -Currently on heparin  drip, can hold if needed given dropping hemoglobin.  Would restart once stable blood counts -Currently on diltiazem  drip  Suspected multiple myeloma: Oncology following.  On Decadron .  Bone marrow biopsy results pending.  Planning to start treatment with Velcade  and Cytoxan   AKI: Creatinine up to 5.8 on presentation, now 4.8.  Nephrology following   For questions or  updates, please contact [facility] Please consult [email or web address] for contact info under        Signed, Wendie Hamburg, MD  07/07/2023, 11:06 AM

## 2023-07-07 NOTE — Progress Notes (Signed)
 PHARMACY - ANTICOAGULATION CONSULT NOTE  Pharmacy Consult for heparin  Indication: atrial fibrillation  Allergies  Allergen Reactions   Morphine And Codeine Anaphylaxis   Shellfish Allergy Anaphylaxis   Betadine  [Povidone Iodine ] Itching   Bupropion Other (See Comments)    Shaking of the body, hallucinations    Chlorhexidine  Hives    Patient reports never had issues CHG with mouth rinse.   Influenza Vaccines Hives   Metoprolol  Rash    Patient Measurements: Height: 6\' 2"  (188 cm) Weight: 107.3 kg (236 lb 8.9 oz) IBW/kg (Calculated) : 82.2 HEPARIN  DW (KG): 104.1  Vital Signs: Temp: 97.6 F (36.4 C) (04/21 0910) Temp Source: Oral (04/21 0910) BP: 154/109 (04/21 0910) Pulse Rate: 134 (04/21 0910)  Labs: Recent Labs    07/05/23 0750 07/05/23 2227 07/06/23 0305 07/06/23 2144 07/07/23 0339  HGB 7.2*  --  7.0*  --  6.3*  HCT 24.9*  --  24.9*  --  22.2*  PLT 148*  --  150  --  140*  LABPROT  --  23.3*  --   --   --   INR  --  2.1*  --   --   --   HEPARINUNFRC  --   --   --  0.11*  --   CREATININE 4.34*  --  4.64*  --  4.83*    Estimated Creatinine Clearance: 21.5 mL/min (A) (by C-G formula based on SCr of 4.83 mg/dL (H)).   Medical History: Past Medical History:  Diagnosis Date   Anxiety    Arthritis    Asthma    Bipolar disorder (HCC)    Current every day smoker    Depression    Diabetes mellitus, type II (HCC)    Dyspnea    History of kidney stones    History of pneumonia    Hyperlipidemia    Hypertension    Morbid obesity (HCC)    Sedentary lifestyle    Seizures (HCC)    last seizure 12 yrs ago, no current problem   Sleep apnea    uses CPAP nightly    Assessment: 58 year old male transferred from OSH. He was receiving treatment for hypercalcemia in setting of suspected multiple myeloma. On 4/19 pm, the patient developed afib with RVR, pharmacy consulted to dose heparin .   Of note, patient with anemia of chronic disease. Hgb 6.1 at OSH, transfused  3 units PRBC there. Baseline INR 2.1.  Today, 07/07/23 Heparin  level = 0.29 is slightly subtherapeutic on heparin  infusion of 1750 units/hr CBC: Hgb down to 6.3 > 6.9, transfusing PRBC. Plt stable. D/w CCM > ok to continue heparin  No overt signs of bleeding reported.   Goal of Therapy:  Heparin  level 0.3-0.7 units/ml Monitor platelets by anticoagulation protocol: Yes   Plan:  Given need for transfusions and low Hgb, will hold off on any heparin  boluses for now Increase heparin  infusion to 1850 units/hr Check 6 hour heparin  level CBC, heparin  level daily Monitor for signs of bleeding  Shireen Dory, PharmD 07/07/23 9:24 AM

## 2023-07-07 NOTE — Plan of Care (Signed)

## 2023-07-07 NOTE — Progress Notes (Addendum)
 eLink Physician-Brief Progress Note Patient Name: Frank [name]. 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  He developed epistaxis and bleeding from the IV sites.  Left-sided platelets within normal limits, heparin  level 0.29 which is on the lower end of the spectrum.  Fortunately, bleeding ceased with some pressure while heparin  was held.  eICU Interventions  Will resume heparin  after drawing full set of coagulopathy labs   2134 - DIC labs reviewed. Hold transfusions for now.  Intervention Category Intermediate Interventions: Bleeding - evaluation and treatment with blood products  Frank [name] 07/07/2023, 7:38 PM

## 2023-07-07 NOTE — Progress Notes (Addendum)
 eLink Physician-Brief Progress Note Patient Name: Frank [name]. DOB: [birth year] MRN: [id number]   Date of Service  07/07/2023  HPI/Events of Note  Hemoglobin 6.3, has been borderline for several days.    eICU Interventions  Obtaining a new type and screen, 1 unit PRBC   0450 - K2.9, kcl ordered  Intervention Category Intermediate Interventions: Bleeding - evaluation and treatment with blood products  Luberta Grabinski 07/07/2023, 4:23 AM

## 2023-07-07 NOTE — Progress Notes (Signed)
  [facility] PROGRESS NOTE  Assessment/ Plan: Pt is a 58 y.o. yo male with past medical history significant for HTN, HLD, type II DM, BPH, seizure who was initially presented at [facility] due to AMS, admitted for sepsis due to pneumonia, encephalopathy, sepsis, AKI, hypercalcemia symptomatic anemia and L[place]ick pulm lesion concern for multiple myeloma.  # AKI on CKD 3a - b/l creat 1.1- 1.5 from 2024, eGFR 53- >60 ml/min. Creat here 5.8 on presentation at OSH and last several days in the 4.0- 4.8 range. AKI due to possible myeloma kidney +/- hyperCa +/- ACEi (home med).  Initially treated with IV fluid and then required Lasix  because of fluid overload.  He remains nonoliguric and noted creatinine level is mildly elevated today presumably due to severe anemia and hemodynamic changes. Treatment of multiple myeloma per oncology. May need CRRT or dialysis if no improvement in renal function.  Continue with strict ins and outs and daily lab.  # Multiple myeloma: Noted oncology is planning to treat with Velcade  and Cytoxan .  Will follow the result.  # HTN - uncontrolled. Was on 3 BP meds and 2 diuretics at home. Not able to take pills here. BP's controlled now w/ cardizem  gtt and prn IV meds.   #Volume -required IV diuretics which is off now.  Monitor.  # Atrial fib- started on IV amio and IV diltiazem .  #Hypercalcemia -  resolved  now. Possible myeloma, w/u in progress, oncology following. SP zometa  here and calcitonin.   #Multifocal PNA - on IV abx  #Anemia -getting PRBC transfusion.  # AMS/acute metabolic encephalopathy.  Multifactorial etiology, monitor.  # Hypokalemia: Replete potassium chloride .   Subjective: Seen and examined in ICU.  Patient remains confused.  Urine output is recorded around 870 cc.  No new event. Objective Vital signs in last 24 hours: Vitals:   07/07/23 0645 07/07/23 0655 07/07/23 0715 07/07/23 0800  BP: (!) 147/101 133/83  137/87 (!) 132/101  Pulse: (!) 121 (!) 131 (!) 123 (!) 131  Resp: (!) 27 (!) 25 (!) 27 (!) 28  Temp:  100.3 F (37.9 C)  99.8 F (37.7 C)  TempSrc:  Axillary  Axillary  SpO2: 90% 94% 93% 94%  Weight:      Height:       Weight change:   Intake/Output Summary (Last 24 hours) at 07/07/2023 0859 Last data filed at 07/07/2023 0612 Gross per 24 hour  Intake 3987.63 ml  Output 2290 ml  Net 1697.63 ml       Labs: RENAL PANEL Recent Labs  Lab 07/03/23 0505 07/04/23 0547 07/05/23 0750 07/06/23 0305 07/07/23 0339  NA 135 138 141 141 144  K 3.6 3.5 3.5 3.0* 2.9*  CL 112* 113* 114* 115* 117*  CO2 20* 22 21* 21* 22  GLUCOSE 104* 87 130* 137* 175*  BUN 34* 34* 40* 51* 63*  CREATININE 4.32* 4.10* 4.34* 4.64* 4.83*  CALCIUM  9.9 10.5* 9.3 8.6* 8.1*  MG 1.8 1.6* 1.4* 1.8 2.0  PHOS 3.8  --   --   --   --   ALBUMIN  <1.5* <1.5* <1.5* 1.5* <1.5*    Liver Function Tests: Recent Labs  Lab 07/05/23 0750 07/06/23 0305 07/07/23 0339  AST 30 27 13*  ALT 12 13 11   ALKPHOS 34* 30* 31*  BILITOT 0.7 0.7 0.5  PROT >12.0* >12.0* >12.0*  ALBUMIN  <1.5* 1.5* <1.5*   No results for input(s): "LIPASE", "AMYLASE" in the last 168 hours. Recent Labs  Lab  07/05/23 1716 07/07/23 0339  AMMONIA 81* 70*   CBC: Recent Labs    07/03/23 0450 07/04/23 0547 07/05/23 0750 07/06/23 0305 07/07/23 0339  HGB 7.2* 7.5* 7.2* 7.0* 6.3*  MCV 92.8 93.5 93.6 92.2 94.5    Cardiac Enzymes: No results for input(s): "CKTOTAL", "CKMB", "CKMBINDEX", "TROPONINI" in the last 168 hours. CBG: Recent Labs  Lab 07/06/23 1137 07/06/23 1621 07/06/23 2207 07/07/23 0416 07/07/23 0809  GLUCAP 228* 186* 186* 162* 164*    Iron Studies: No results for input(s): "IRON", "TIBC", "TRANSFERRIN", "FERRITIN" in the last 72 hours. Studies/Results: DG CHEST PORT 1 VIEW Result Date: 07/06/2023 CLINICAL DATA:  Weakness altered EXAM: PORTABLE CHEST 1 VIEW COMPARISON:  07/05/2023 FINDINGS: Hypoventilatory changes.  Cardiomegaly with mild central congestion. No consolidation, pleural effusion, or pneumothorax IMPRESSION: Cardiomegaly with mild central congestion. Electronically Signed   By: Esmeralda Hedge M.D.   On: 07/06/2023 18:17   DG CHEST PORT 1 VIEW Result Date: 07/05/2023 CLINICAL DATA:  Dyspnea EXAM: PORTABLE CHEST 1 VIEW COMPARISON:  07/04/2023 FINDINGS: Hypoventilatory change. Cardiomegaly with mild central congestion. No pleural effusion or pneumothorax. Advanced degenerative change of the shoulders. IMPRESSION: Cardiomegaly with mild central congestion. Electronically Signed   By: Esmeralda Hedge M.D.   On: 07/05/2023 16:06    Medications: Infusions:  amiodarone  30 mg/hr (07/07/23 0808)   cefTRIAXone  (ROCEPHIN )  IV Stopped (07/07/23 1610)   diltiazem  (CARDIZEM ) infusion 15 mg/hr (07/07/23 0612)   famotidine  (PEPCID ) IV Stopped (07/06/23 1104)   fluconazole  (DIFLUCAN ) IV Stopped (07/06/23 1206)   heparin  1,750 Units/hr (07/07/23 0612)   valproate sodium  Stopped (07/07/23 0209)    Scheduled Medications:  feeding supplement  237 mL Oral BID BM   insulin  aspart  0-5 Units Subcutaneous QHS   insulin  aspart  0-9 Units Subcutaneous TID WC   lactulose   300 mL Rectal BID   nicotine   14 mg Transdermal Daily   mouth rinse  15 mL Mouth Rinse 4 times per day   potassium chloride   40 mEq Oral Q4H    have reviewed scheduled and prn medications.  Physical Exam: General:NAD, comfortable Heart:RRR, s1s2 nl Lungs:clear b/l, no crackle Abdomen:soft, Non-tender, non-distended Extremities:No edema Neurology: Confused male.  Kamran Coker Prasad Cheng Dec 07/07/2023,8:59 AM  LOS: 4 days

## 2023-07-07 NOTE — Progress Notes (Signed)
 Unfortunately, [name] had to go to the ICU over the weekend.  He went into rapid A-fib.  He is still in rapid A-fib.  He is on a Cardizem  drip.  His mental status might be a little bit better.  He still does not talk all that much.  He clearly needs to be transfused.  His white count is 13.7.  Hemoglobin 6.3.  Platelet count 140,000.  Sodium 144.  Potassium 2.9.  BUN 63 creatinine 4.3.  Calcium  8.1 with albumin  less than 1.5.  I cannot believe he takes 4 days and we still do not have the serum viscosity back.  We really have to get started on treatment.  I really believe that he needs to have treatment for the myeloma.  I would think that the bone marrow biopsy should come back today.  I want him to be treated with Velcade  and Cytoxan .  He did have some high-dose Decadron  recently.  I think the Velcade  and Cytoxan  would be a good idea for him given his renal insufficiency.  I am unsure he is really eating much at all.  He does have a low-grade temperature.  Temperature is 100.6.  Pulse 130.  Blood pressure 147/101.  His lungs sound clear bilaterally.  Cardiac exam tachycardic and irregular consistent with atrial fibrillation.  Abdomen is soft.  Bowel sounds are somewhat decreased.  He has no guarding or rebound tenderness.  Neurological exam shows a cognitive changes.  He moves all extremities okay.   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  Again we have to get treatment started on him.  Orders are all written.  I really see very little downside to treating him right now.  I am sure that the CCM staff will get his atrial fibrillation under better control.  Again he is going to be transfused today.  I know he will get great care from all of the ICU staff.  They are so compassionate and so professional.  [name], MD  Romans 5:3-5

## 2023-07-07 NOTE — Plan of Care (Signed)

## 2023-07-07 NOTE — Progress Notes (Signed)
  Echocardiogram 2D Echocardiogram has been performed.  Shilo Philipson L Charron Coultas RDCS 07/07/2023, 9:43 AM

## 2023-07-08 ENCOUNTER — Inpatient Hospital Stay (HOSPITAL_COMMUNITY)

## 2023-07-08 ENCOUNTER — Encounter (HOSPITAL_COMMUNITY): Payer: Self-pay | Admitting: Internal Medicine

## 2023-07-08 DIAGNOSIS — Z515 Encounter for palliative care: Secondary | ICD-10-CM | POA: Diagnosis not present

## 2023-07-08 DIAGNOSIS — G9340 Encephalopathy, unspecified: Secondary | ICD-10-CM | POA: Diagnosis not present

## 2023-07-08 DIAGNOSIS — C9000 Multiple myeloma not having achieved remission: Secondary | ICD-10-CM | POA: Diagnosis not present

## 2023-07-08 DIAGNOSIS — L89313 Pressure ulcer of right buttock, stage 3: Secondary | ICD-10-CM | POA: Diagnosis not present

## 2023-07-08 DIAGNOSIS — A411 Sepsis due to other specified staphylococcus: Secondary | ICD-10-CM | POA: Diagnosis not present

## 2023-07-08 DIAGNOSIS — J9601 Acute respiratory failure with hypoxia: Secondary | ICD-10-CM

## 2023-07-08 DIAGNOSIS — I480 Paroxysmal atrial fibrillation: Secondary | ICD-10-CM

## 2023-07-08 DIAGNOSIS — R531 Weakness: Secondary | ICD-10-CM | POA: Diagnosis not present

## 2023-07-08 DIAGNOSIS — I4891 Unspecified atrial fibrillation: Secondary | ICD-10-CM | POA: Diagnosis not present

## 2023-07-08 DIAGNOSIS — N179 Acute kidney failure, unspecified: Secondary | ICD-10-CM | POA: Diagnosis not present

## 2023-07-08 LAB — BLOOD GAS, ARTERIAL
Acid-Base Excess: 1 mmol/L (ref 0.0–2.0)
Acid-base deficit: 1.5 mmol/L (ref 0.0–2.0)
Bicarbonate: 23.6 mmol/L (ref 20.0–28.0)
Bicarbonate: 24.3 mmol/L (ref 20.0–28.0)
Drawn by: 30136
Drawn by: 25770
FIO2: 4 %
FIO2: 100 %
MECHVT: 650 mL
O2 Saturation: 99.7 %
O2 Saturation: 100 %
PEEP: 8 cmH2O
Patient temperature: 36.7
Patient temperature: 38.3
RATE: 20 {breaths}/min
pCO2 arterial: 31 mmHg — ABNORMAL LOW (ref 32–48)
pCO2 arterial: 47 mmHg (ref 32–48)
pH, Arterial: 7.5 — ABNORMAL HIGH (ref 7.35–7.45)
pH, Arterial: 7.33 — ABNORMAL LOW (ref 7.35–7.45)
pO2, Arterial: 71 mmHg — ABNORMAL LOW (ref 83–108)
pO2, Arterial: 369 mmHg — ABNORMAL HIGH (ref 83–108)

## 2023-07-08 LAB — COMPREHENSIVE METABOLIC PANEL WITH GFR
ALT: 16 U/L (ref 0–44)
AST: 27 U/L (ref 15–41)
Albumin: <1.5 g/dL — ABNORMAL LOW (ref 3.5–5.0)
Alkaline Phosphatase: 37 U/L — ABNORMAL LOW (ref 38–126)
Anion gap: 6 (ref 5–15)
BUN: 77 mg/dL — ABNORMAL HIGH (ref 6–20)
CO2: 20 mmol/L — ABNORMAL LOW (ref 22–32)
Calcium: 7.9 mg/dL — ABNORMAL LOW (ref 8.9–10.3)
Chloride: 119 mmol/L — ABNORMAL HIGH (ref 98–111)
Creatinine, Ser: 4.72 mg/dL — ABNORMAL HIGH (ref 0.61–1.24)
GFR, Estimated: 13 mL/min — ABNORMAL LOW (ref >60)
Glucose, Bld: 182 mg/dL — ABNORMAL HIGH (ref 70–99)
Potassium: 3.3 mmol/L — ABNORMAL LOW (ref 3.5–5.1)
Sodium: 145 mmol/L (ref 135–145)
Total Bilirubin: 0.5 mg/dL (ref 0.0–1.2)
Total Protein: 11.1 g/dL — ABNORMAL HIGH (ref 6.5–8.1)

## 2023-07-08 LAB — HEPARIN LEVEL (UNFRACTIONATED): Heparin Unfractionated: 0.34 [IU]/mL (ref 0.30–0.70)

## 2023-07-08 LAB — CBC WITH DIFFERENTIAL/PLATELET
Abs Immature Granulocytes: 0 10*3/uL (ref 0.00–0.07)
Basophils Absolute: 0 10*3/uL (ref 0.0–0.1)
Basophils Relative: 0 %
Eosinophils Absolute: 0 10*3/uL (ref 0.0–0.5)
Eosinophils Relative: 0 %
HCT: 19.5 % — ABNORMAL LOW (ref 39.0–52.0)
Hemoglobin: 5.6 g/dL — CL (ref 13.0–17.0)
Lymphocytes Relative: 43 %
Lymphs Abs: 6.8 10*3/uL — ABNORMAL HIGH (ref 0.7–4.0)
MCH: 28 pg (ref 26.0–34.0)
MCHC: 28.7 g/dL — ABNORMAL LOW (ref 30.0–36.0)
MCV: 97.5 fL (ref 80.0–100.0)
Monocytes Absolute: 1.1 10*3/uL — ABNORMAL HIGH (ref 0.1–1.0)
Monocytes Relative: 7 %
Neutro Abs: 7.9 10*3/uL — ABNORMAL HIGH (ref 1.7–7.7)
Neutrophils Relative %: 50 %
Platelets: 147 10*3/uL — ABNORMAL LOW (ref 150–400)
RBC: 2 MIL/uL — ABNORMAL LOW (ref 4.22–5.81)
RDW: 25.6 % — ABNORMAL HIGH (ref 11.5–15.5)
WBC: 15.7 10*3/uL — ABNORMAL HIGH (ref 4.0–10.5)
nRBC: 3 /100{WBCs} — ABNORMAL HIGH
nRBC: 3.1 % — ABNORMAL HIGH (ref 0.0–0.2)

## 2023-07-08 LAB — GLUCOSE, CAPILLARY
Glucose-Capillary: 152 mg/dL — ABNORMAL HIGH (ref 70–99)
Glucose-Capillary: 169 mg/dL — ABNORMAL HIGH (ref 70–99)
Glucose-Capillary: 167 mg/dL — ABNORMAL HIGH (ref 70–99)
Glucose-Capillary: 172 mg/dL — ABNORMAL HIGH (ref 70–99)
Glucose-Capillary: 201 mg/dL — ABNORMAL HIGH (ref 70–99)
Glucose-Capillary: 237 mg/dL — ABNORMAL HIGH (ref 70–99)
Glucose-Capillary: 244 mg/dL — ABNORMAL HIGH (ref 70–99)

## 2023-07-08 LAB — UPEP/UIFE/LIGHT CHAINS/TP, 24-HR UR
% BETA, Urine: 22.6 %
ALPHA 1 URINE: 3 %
Albumin, U: 20 %
Alpha 2, Urine: 11.4 %
Free Kappa Lt Chains,Ur: 354.79 mg/L — ABNORMAL HIGH (ref 1.17–86.46)
Free Kappa/Lambda Ratio: 34.89 — ABNORMAL HIGH (ref 1.83–14.26)
Free Lambda Lt Chains,Ur: 10.17 mg/L (ref 0.27–15.21)
GAMMA GLOBULIN URINE: 43 %
M-SPIKE %, Urine: 28.9 % — ABNORMAL HIGH
Total Protein, Urine: 39.9 mg/dL

## 2023-07-08 LAB — PROCALCITONIN: Procalcitonin: 0.54 ng/mL

## 2023-07-08 LAB — PREPARE RBC (CROSSMATCH)

## 2023-07-08 LAB — AMMONIA: Ammonia: 116 umol/L — ABNORMAL HIGH (ref 9–35)

## 2023-07-08 LAB — MAGNESIUM
Magnesium: 2.1 mg/dL (ref 1.7–2.4)
Magnesium: 2.1 mg/dL (ref 1.7–2.4)

## 2023-07-08 LAB — HEMOGLOBIN AND HEMATOCRIT, BLOOD
HCT: 21.4 % — ABNORMAL LOW (ref 39.0–52.0)
Hemoglobin: 6.2 g/dL — CL (ref 13.0–17.0)

## 2023-07-08 LAB — PHOSPHORUS: Phosphorus: 4.2 mg/dL (ref 2.5–4.6)

## 2023-07-08 LAB — SURGICAL PATHOLOGY

## 2023-07-08 MED ORDER — SODIUM CHLORIDE 0.9 % IV SOLN
400.0000 mg/m2 | Freq: Once | INTRAVENOUS | Status: AC
  Administered 2023-07-08: 1000 mg via INTRAVENOUS
  Filled 2023-07-08: qty 50

## 2023-07-08 MED ORDER — ETOMIDATE 2 MG/ML IV SOLN
INTRAVENOUS | Status: AC
  Filled 2023-07-08: qty 20

## 2023-07-08 MED ORDER — POLYETHYLENE GLYCOL 3350 17 G PO PACK: 17.0000 g | PACK | Freq: Every day | ORAL | Status: DC | PRN

## 2023-07-08 MED ORDER — INSULIN ASPART 100 UNIT/ML IJ SOLN
0.0000 [IU] | INTRAMUSCULAR | Status: DC
  Administered 2023-07-08 – 2023-07-09 (×5): 3 [IU] via SUBCUTANEOUS

## 2023-07-08 MED ORDER — PIPERACILLIN-TAZOBACTAM 3.375 G IVPB
3.3750 g | Freq: Three times a day (TID) | INTRAVENOUS | Status: DC
  Administered 2023-07-08 – 2023-07-09 (×3): 3.375 g via INTRAVENOUS
  Filled 2023-07-08 (×3): qty 50

## 2023-07-08 MED ORDER — DEXMEDETOMIDINE HCL IN NACL 200 MCG/50ML IV SOLN
0.0000 ug/kg/h | INTRAVENOUS | Status: DC
  Administered 2023-07-08: 0.6 ug/kg/h via INTRAVENOUS
  Administered 2023-07-08: 0.4 ug/kg/h via INTRAVENOUS
  Administered 2023-07-08: 0.8 ug/kg/h via INTRAVENOUS
  Administered 2023-07-08: 0.7 ug/kg/h via INTRAVENOUS
  Administered 2023-07-08: 0.8 ug/kg/h via INTRAVENOUS
  Filled 2023-07-08 (×5): qty 50

## 2023-07-08 MED ORDER — FENTANYL CITRATE (PF) 100 MCG/2ML IJ SOLN: 100.0000 ug | Freq: Once | INTRAMUSCULAR | Status: AC

## 2023-07-08 MED ORDER — POLYETHYLENE GLYCOL 3350 17 G PO PACK: 17.0000 g | PACK | Freq: Every day | ORAL | Status: DC

## 2023-07-08 MED ORDER — SODIUM CHLORIDE 0.9% IV SOLUTION: Freq: Once | INTRAVENOUS | Status: AC

## 2023-07-08 MED ORDER — ORAL CARE MOUTH RINSE
15.0000 mL | OROMUCOSAL | Status: DC
Start: 2023-07-09 — End: 2023-07-09
  Administered 2023-07-08 – 2023-07-09 (×11): 15 mL via OROMUCOSAL

## 2023-07-08 MED ORDER — MIDAZOLAM HCL 2 MG/2ML IJ SOLN
INTRAMUSCULAR | Status: AC
Start: 2023-07-08 — End: 2023-07-08
  Administered 2023-07-08: 2 mg via INTRAVENOUS
  Filled 2023-07-08: qty 2

## 2023-07-08 MED ORDER — MIDAZOLAM HCL 2 MG/2ML IJ SOLN: 2.0000 mg | Freq: Once | INTRAMUSCULAR | Status: AC

## 2023-07-08 MED ORDER — VASOPRESSIN 20 UNITS/100 ML INFUSION FOR SHOCK
0.0300 [IU]/min | INTRAVENOUS | Status: DC
  Administered 2023-07-08 – 2023-07-22 (×27): 0.03 [IU]/min via INTRAVENOUS
  Filled 2023-07-08 (×27): qty 100

## 2023-07-08 MED ORDER — VALPROIC ACID 250 MG/5ML PO SOLN
250.0000 mg | Freq: Four times a day (QID) | ORAL | Status: DC
  Administered 2023-07-08 – 2023-07-14 (×25): 250 mg
  Filled 2023-07-08 (×26): qty 5

## 2023-07-08 MED ORDER — LACTULOSE 10 GM/15ML PO SOLN: 30.0000 g | Freq: Three times a day (TID) | ORAL | Status: DC

## 2023-07-08 MED ORDER — SODIUM CHLORIDE 0.9% IV SOLUTION
Freq: Once | INTRAVENOUS | Status: AC
Start: 2023-07-08 — End: 2023-07-09

## 2023-07-08 MED ORDER — FENTANYL CITRATE PF 50 MCG/ML IJ SOSY
50.0000 ug | PREFILLED_SYRINGE | INTRAMUSCULAR | Status: DC | PRN
  Administered 2023-07-08 – 2023-07-09 (×4): 100 ug via INTRAVENOUS
  Administered 2023-07-09 (×2): 150 ug via INTRAVENOUS
  Administered 2023-07-09: 200 ug via INTRAVENOUS
  Administered 2023-07-09 (×2): 100 ug via INTRAVENOUS
  Administered 2023-07-09 (×2): 150 ug via INTRAVENOUS
  Administered 2023-07-09: 200 ug via INTRAVENOUS
  Administered 2023-07-09: 150 ug via INTRAVENOUS
  Administered 2023-07-09: 200 ug via INTRAVENOUS
  Administered 2023-07-09 – 2023-07-10 (×2): 150 ug via INTRAVENOUS
  Administered 2023-07-10 – 2023-07-14 (×16): 100 ug via INTRAVENOUS
  Administered 2023-07-15 (×3): 75 ug via INTRAVENOUS
  Filled 2023-07-08: qty 2
  Filled 2023-07-08: qty 4
  Filled 2023-07-08 (×2): qty 2
  Filled 2023-07-08: qty 4

## 2023-07-08 MED ORDER — PHENYLEPHRINE 80 MCG/ML (10ML) SYRINGE FOR IV PUSH (FOR BLOOD PRESSURE SUPPORT): 160.0000 ug | PREFILLED_SYRINGE | Freq: Once | INTRAVENOUS | Status: AC | PRN

## 2023-07-08 MED ORDER — ROCURONIUM BROMIDE 10 MG/ML (PF) SYRINGE: 100.0000 mg | PREFILLED_SYRINGE | Freq: Once | INTRAVENOUS | Status: AC

## 2023-07-08 MED ORDER — PNEUMOCOCCAL 20-VAL CONJ VACC 0.5 ML IM SUSY
0.5000 mL | PREFILLED_SYRINGE | INTRAMUSCULAR | Status: DC
  Filled 2023-07-08: qty 0.5

## 2023-07-08 MED ORDER — VITAL 1.5 CAL PO LIQD
1000.0000 mL | ORAL | Status: DC
  Administered 2023-07-08: 1000 mL
  Filled 2023-07-08 (×2): qty 1000

## 2023-07-08 MED ORDER — FAMOTIDINE 40 MG/5ML PO SUSR
10.0000 mg | Freq: Every day | ORAL | Status: DC
  Administered 2023-07-08 – 2023-08-04 (×28): 10.4 mg
  Filled 2023-07-08 (×31): qty 2.5

## 2023-07-08 MED ORDER — FENTANYL CITRATE (PF) 100 MCG/2ML IJ SOLN
INTRAMUSCULAR | Status: AC
  Administered 2023-07-08: 100 ug via INTRAVENOUS
  Filled 2023-07-08: qty 2

## 2023-07-08 MED ORDER — MELATONIN 5 MG PO TABS: 5.0000 mg | ORAL_TABLET | Freq: Every evening | ORAL | Status: DC | PRN

## 2023-07-08 MED ORDER — ROCURONIUM BROMIDE 10 MG/ML (PF) SYRINGE
PREFILLED_SYRINGE | INTRAVENOUS | Status: AC
  Administered 2023-07-08: 100 mg via INTRAVENOUS
  Filled 2023-07-08: qty 10

## 2023-07-08 MED ORDER — NOREPINEPHRINE 4 MG/250ML-% IV SOLN
INTRAVENOUS | Status: AC
Start: 2023-07-08 — End: 2023-07-08
  Administered 2023-07-08: 2 ug/min via INTRAVENOUS
  Filled 2023-07-08: qty 250

## 2023-07-08 MED ORDER — SODIUM CHLORIDE 0.9% FLUSH
10.0000 mL | INTRAVENOUS | Status: DC | PRN
  Administered 2023-08-11: 10 mL

## 2023-07-08 MED ORDER — PALONOSETRON HCL INJECTION 0.25 MG/5ML
0.2500 mg | Freq: Once | INTRAVENOUS | Status: AC
Start: 2023-07-08 — End: 2023-07-08
  Administered 2023-07-08: 0.25 mg via INTRAVENOUS
  Filled 2023-07-08: qty 5

## 2023-07-08 MED ORDER — NOREPINEPHRINE 4 MG/250ML-% IV SOLN
0.0000 ug/min | INTRAVENOUS | Status: DC
  Administered 2023-07-08 (×2): 17 ug/min via INTRAVENOUS
  Administered 2023-07-09 (×2): 9 ug/min via INTRAVENOUS
  Administered 2023-07-09: 10 ug/min via INTRAVENOUS
  Filled 2023-07-08 (×5): qty 250

## 2023-07-08 MED ORDER — VALPROATE SODIUM 100 MG/ML IV SOLN
250.0000 mg | Freq: Four times a day (QID) | INTRAVENOUS | Status: DC
  Filled 2023-07-08 (×6): qty 2.5

## 2023-07-08 MED ORDER — DOCUSATE SODIUM 50 MG/5ML PO LIQD
100.0000 mg | Freq: Two times a day (BID) | ORAL | Status: DC
Start: 2023-07-08 — End: 2023-07-09
  Filled 2023-07-08: qty 10

## 2023-07-08 MED ORDER — ORAL CARE MOUTH RINSE: 15.0000 mL | OROMUCOSAL | Status: DC | PRN

## 2023-07-08 MED ORDER — MIDAZOLAM HCL 2 MG/2ML IJ SOLN: 4.0000 mg | Freq: Once | INTRAMUSCULAR | Status: DC

## 2023-07-08 MED ORDER — LACTULOSE 10 GM/15ML PO SOLN
30.0000 g | Freq: Three times a day (TID) | ORAL | Status: DC
  Administered 2023-07-08 – 2023-07-09 (×4): 30 g
  Filled 2023-07-08 (×5): qty 45

## 2023-07-08 MED ORDER — PROSOURCE TF20 ENFIT COMPATIBL EN LIQD
60.0000 mL | Freq: Every day | ENTERAL | Status: DC
  Administered 2023-07-08 – 2023-07-09 (×2): 60 mL
  Filled 2023-07-08 (×2): qty 60

## 2023-07-08 MED ORDER — POTASSIUM CHLORIDE 20 MEQ PO PACK
60.0000 meq | PACK | Freq: Once | ORAL | Status: AC
  Administered 2023-07-08: 60 meq
  Filled 2023-07-08: qty 3

## 2023-07-08 MED ORDER — SODIUM CHLORIDE 0.9% FLUSH
10.0000 mL | Freq: Two times a day (BID) | INTRAVENOUS | Status: DC
  Administered 2023-07-08: 10 mL
  Administered 2023-07-08: 40 mL
  Administered 2023-07-09 – 2023-07-10 (×3): 10 mL
  Administered 2023-07-10: 30 mL
  Administered 2023-07-11: 20 mL
  Administered 2023-07-11: 30 mL
  Administered 2023-07-12 (×2): 10 mL
  Administered 2023-07-13: 40 mL
  Administered 2023-07-13 – 2023-07-19 (×12): 10 mL
  Administered 2023-07-19: 40 mL
  Administered 2023-07-20: 10 mL
  Administered 2023-07-20: 15 mL
  Administered 2023-07-21 – 2023-07-24 (×6): 10 mL
  Administered 2023-07-24: 40 mL
  Administered 2023-07-25 – 2023-07-27 (×6): 10 mL
  Administered 2023-07-28: 20 mL
  Administered 2023-07-28 – 2023-08-03 (×12): 10 mL
  Administered 2023-08-04: 20 mL
  Administered 2023-08-04 – 2023-08-19 (×25): 10 mL

## 2023-07-08 MED ORDER — VALPROIC ACID 250 MG/5ML PO SOLN
250.0000 mg | Freq: Four times a day (QID) | ORAL | Status: DC
  Administered 2023-07-08: 250 mg via ORAL
  Filled 2023-07-08 (×2): qty 5

## 2023-07-08 MED ORDER — FUROSEMIDE 10 MG/ML IJ SOLN
80.0000 mg | Freq: Once | INTRAMUSCULAR | Status: AC
  Administered 2023-07-08: 80 mg via INTRAVENOUS
  Filled 2023-07-08: qty 8

## 2023-07-08 MED ORDER — MIDAZOLAM HCL 2 MG/2ML IJ SOLN
INTRAMUSCULAR | Status: AC
  Filled 2023-07-08: qty 2

## 2023-07-08 MED ORDER — PHENYLEPHRINE 80 MCG/ML (10ML) SYRINGE FOR IV PUSH (FOR BLOOD PRESSURE SUPPORT)
160.0000 ug | PREFILLED_SYRINGE | Freq: Once | INTRAVENOUS | Status: AC | PRN
  Administered 2023-07-08: 160 ug via INTRAVENOUS

## 2023-07-08 MED ORDER — DEXMEDETOMIDINE HCL IN NACL 400 MCG/100ML IV SOLN
0.0000 ug/kg/h | INTRAVENOUS | Status: DC
  Administered 2023-07-09: 0.8 ug/kg/h via INTRAVENOUS
  Filled 2023-07-08: qty 100

## 2023-07-08 MED ORDER — BORTEZOMIB CHEMO SQ INJECTION 3.5 MG (2.5MG/ML)
1.3000 mg/m2 | Freq: Once | INTRAMUSCULAR | Status: AC
  Administered 2023-07-08: 3 mg via SUBCUTANEOUS
  Filled 2023-07-08: qty 1.2

## 2023-07-08 MED ORDER — PHENYLEPHRINE 80 MCG/ML (10ML) SYRINGE FOR IV PUSH (FOR BLOOD PRESSURE SUPPORT)
PREFILLED_SYRINGE | INTRAVENOUS | Status: AC
  Administered 2023-07-08: 160 ug via INTRAVENOUS
  Filled 2023-07-08: qty 10

## 2023-07-08 MED ORDER — FENTANYL CITRATE (PF) 100 MCG/2ML IJ SOLN
INTRAMUSCULAR | Status: AC
  Filled 2023-07-08: qty 2

## 2023-07-08 MED ORDER — LORAZEPAM 2 MG/ML IJ SOLN
1.0000 mg | INTRAMUSCULAR | Status: DC | PRN
  Administered 2023-07-08: 1 mg via INTRAVENOUS

## 2023-07-08 MED ORDER — FENTANYL CITRATE PF 50 MCG/ML IJ SOSY
50.0000 ug | PREFILLED_SYRINGE | INTRAMUSCULAR | Status: DC | PRN
  Administered 2023-07-13: 50 ug via INTRAVENOUS
  Filled 2023-07-08: qty 1

## 2023-07-08 NOTE — Progress Notes (Signed)
 IVT consult placed for additional access needs. Patient has numerous IV medications and requires blood transfusion. Currently has 2 PIV's placed by IV team - left arm is restricted with an arm brace. Patient currently has blood pressure cuff on right arm increasing risk for infiltration of current IV's. 3rd IV was started.   Nephrology MD rounded and agreed with central liine placement given his acuity and needs. PICC isn't indicated due to GFR <30 - central line is recommended. ICU MD present during assessment and agreed with line placement.   At present time, patient has 3 PIV's that can hold patient over until line placement however peripheral access is limited and if one of the IV's fails, there isn't much IVT can do at this point.   Linford Quintela R Minoru Chap, RN

## 2023-07-08 NOTE — Progress Notes (Signed)
 Heparin  gtt held per Dr Canda Cera verbal order d/t oral bleeding. Awaiting labs still at this time.

## 2023-07-08 NOTE — Progress Notes (Signed)
 Rounding Note    Patient Name: Frank [name]. Date of Encounter: 07/08/2023  [facility] Cardiologist: None   Subjective   Cr 4.7 today, Hgb down to 5.6.  ON 15L HFNC.  PCCM planning intubation.  Not following commands or responding to questions  Inpatient Medications    Scheduled Meds:  cyclophosphamide   400 mg/m2 (Treatment Plan Recorded) Intravenous Once   etomidate        famotidine   10.4 mg Per Tube Daily   feeding supplement  237 mL Oral BID BM   feeding supplement (VITAL HIGH PROTEIN)  1,000 mL Per Tube Q24H   fentaNYL        insulin  aspart  0-9 Units Subcutaneous Q4H   lactulose   300 mL Rectal BID   midazolam        nicotine   14 mg Transdermal Daily   mouth rinse  15 mL Mouth Rinse 4 times per day   [START ON 07/09/2023] pneumococcal 20-valent conjugate vaccine  0.5 mL Intramuscular Tomorrow-1000   rocuronium        valproic  acid  250 mg Per Tube Q6H   Continuous Infusions:  amiodarone  30 mg/hr (07/08/23 1000)   diltiazem  (CARDIZEM ) infusion 15 mg/hr (07/08/23 1000)   fluconazole  (DIFLUCAN ) IV Stopped (07/08/23 0949)   PRN Meds: acetaminophen  (TYLENOL ) oral liquid 160 mg/5 mL **OR** acetaminophen , etomidate , fentaNYL , haloperidol  lactate, hydrALAZINE , labetalol , LORazepam , LORazepam , melatonin, midazolam , mouth rinse, mouth rinse, oxyCODONE , polyethylene glycol, rocuronium    Vital Signs    Vitals:   07/08/23 1043 07/08/23 1048 07/08/23 1100 07/08/23 1103  BP: (!) 94/54  123/73 106/64  Pulse: (!) 119 (!) 118 (!) 122 (!) 116  Resp: (!) 34 (!) 33 (!) 29 (!) 36  Temp: (!) 100.6 F (38.1 C)   (!) 100.9 F (38.3 C)  TempSrc: Oral   Oral  SpO2: 95% 94% 98% 94%  Weight:      Height:        Intake/Output Summary (Last 24 hours) at 07/08/2023 1115 Last data filed at 07/08/2023 1000 Gross per 24 hour  Intake 2761.21 ml  Output 1850 ml  Net 911.21 ml      07/08/2023    5:00 AM 07/05/2023   11:32 AM 07/03/2023    4:30 AM  Last 3 Weights  Weight  (lbs) 227 lb 11.8 oz 236 lb 8.9 oz 243 lb 14.4 oz  Weight (kg) 103.3 kg 107.3 kg 110.632 kg      Telemetry    Sinus tach 110-120s- Personally Reviewed  ECG    No new ECG- Personally Reviewed  Physical Exam   GEN: Ill-appearing Neck: No JVD Cardiac: Regular, tachycardic no murmurs, rubs, or gallops.  Respiratory: Diminished breath sounds GI: Soft MS: No edema Neuro: Not answering questions or following commands Psych: Unable to assess  Labs    High Sensitivity Troponin:  No results for input(s): "TROPONINIHS" in the last 720 hours.   Chemistry Recent Labs  Lab 07/06/23 0305 07/07/23 0339 07/08/23 0828  NA 141 144 145  K 3.0* 2.9* 3.3*  CL 115* 117* 119*  CO2 21* 22 20*  GLUCOSE 137* 175* 182*  BUN 51* 63* 77*  CREATININE 4.64* 4.83* 4.72*  CALCIUM  8.6* 8.1* 7.9*  MG 1.8 2.0 2.1  PROT >12.0* >12.0* 11.1*  ALBUMIN  1.5* <1.5* <1.5*  AST 27 13* 27  ALT 13 11 16   ALKPHOS 30* 31* 37*  BILITOT 0.7 0.5 0.5  GFRNONAA 14* 13* 13*  ANIONGAP 5 5 6     Lipids No  results for input(s): "CHOL", "TRIG", "HDL", "LABVLDL", "LDLCALC", "CHOLHDL" in the last 168 hours.  Hematology Recent Labs  Lab 07/06/23 0305 07/07/23 0339 07/07/23 1107 07/07/23 2030 07/08/23 0828  WBC 10.1 13.7*  --   --  15.7*  RBC 2.70* 2.35*  --   --  2.00*  HGB 7.0* 6.3* 6.9*  --  5.6*  HCT 24.9* 22.2* 23.0*  --  19.5*  MCV 92.2 94.5  --   --  97.5  MCH 25.9* 26.8  --   --  28.0  MCHC 28.1* 28.4*  --   --  28.7*  RDW 27.6* 27.6*  --   --  25.6*  PLT 150 140*  --  137* 147*   Thyroid No results for input(s): "TSH", "FREET4" in the last 168 hours.  BNPNo results for input(s): "BNP", "PROBNP" in the last 168 hours.  DDimer  Recent Labs  Lab 07/07/23 2030  DDIMER 0.93*     Radiology    DG CHEST PORT 1 VIEW Result Date: 07/08/2023 CLINICAL DATA:  Dyspnea. EXAM: PORTABLE CHEST 1 VIEW COMPARISON:  07/06/2023 FINDINGS: Feeding tube extends down the esophagus into the stomach. Tube appears to be  coiled in the stomach but the tip is not clearly identified. Heart size is within normal limits and stable. Prominent vascular and interstitial lung markings are unchanged. No focal airspace disease or lung consolidation. Again noted is severe joint space narrowing and probable degenerative changes in both shoulders. Trachea is midline. IMPRESSION: 1. Again noted are prominent vascular and interstitial lung markings. Findings could represent vascular congestion or mild edema. Minimal change since 07/06/2023. 2. Feeding tube extends into the abdomen. Electronically Signed   By: Elene Griffes M.D.   On: 07/08/2023 10:25   DG Abd 1 View Result Date: 07/07/2023 CLINICAL DATA:  Nasogastric tube placement. EXAM: ABDOMEN - 1 VIEW COMPARISON:  None Available. FINDINGS: Weighted enteric tube tip in the left upper abdomen in the region of the mid or distal stomach. No bowel dilatation in the included upper abdomen. IMPRESSION: Weighted enteric tube tip in the mid or distal stomach. Electronically Signed   By: Chadwick Colonel M.D.   On: 07/07/2023 17:08   DG Fluoro Rm 1-60 Min - No Report Result Date: 07/07/2023 CLINICAL DATA:  Acute encephalopathy, nasogastric tube placement requested. EXAM: FLUORO RM 1-60 MIN-NO REPORT FLUOROSCOPY: Fluoroscopy Time:  0 minutes 48 seconds Radiation Exposure Index (if provided by the fluoroscopic device): 7.0 mGy Number of Acquired Spot Images: 1 COMPARISON:  None Available. FINDINGS: Multiple attempts at nasogastric tube placement with a 20 French catheter resulted in right or left mainstem bronchus positioning. Patient was unable to swallow or follow directions to aid with insertion. Study was terminated. IMPRESSION: Unsuccessful nasogastric tube placement under fluoroscopy. Electronically Signed   By: Shearon Denis M.D.   On: 07/07/2023 14:13   ECHOCARDIOGRAM COMPLETE Result Date: 07/07/2023    ECHOCARDIOGRAM REPORT   Patient Name:   Frank [name]. Date of Exam: 07/07/2023  Medical Rec #:  [id number]          Height:       74.0 in Accession #:    [id number]         Weight:       236.6 lb Date of Birth:  [birth year]          BSA:          2.334 m Patient Age:    32 years  BP:           141/88 mmHg Patient Gender: M                  HR:           110 bpm. Exam Location:  Inpatient Procedure: 3D Echo, Cardiac Doppler, Color Doppler and Intracardiac            Opacification Agent (Both Spectral and Color Flow Doppler were            utilized during procedure). Indications:    Atrial fibrillation  History:        Patient has no prior history of Echocardiogram examinations.  Sonographer:    Juanita Shaw Referring Phys: [id number] AVWUJW PARKER  Sonographer Comments: Image acquisition challenging due to patient body habitus and Image acquisition challenging due to respiratory motion. IMPRESSIONS  1. Left ventricular ejection fraction, by estimation, is 65 to 70%. The left ventricle has normal function. The left ventricle has no regional wall motion abnormalities. Left ventricular diastolic parameters are indeterminate.  2. Right ventricular systolic function is normal. The right ventricular size is normal. There is normal pulmonary artery systolic pressure. The estimated right ventricular systolic pressure is 35.8 mmHg.  3. Left atrial size was moderately dilated.  4. The mitral valve is normal in structure. No evidence of mitral valve regurgitation. No evidence of mitral stenosis.  5. The aortic valve is tricuspid. Aortic valve regurgitation is not visualized. Aortic valve sclerosis/calcification is present, without any evidence of aortic stenosis. Aortic valve mean gradient measures 8.0 mmHg.  6. Aortic dilatation noted. There is mild dilatation of the aortic root, measuring 39 mm.  7. The inferior vena cava is dilated in size with <50% respiratory variability, suggesting right atrial pressure of 15 mmHg.  8. The patient was in atrial fibrillation. FINDINGS  Left Ventricle: Left  ventricular ejection fraction, by estimation, is 65 to 70%. The left ventricle has normal function. The left ventricle has no regional wall motion abnormalities. The left ventricular internal cavity size was normal in size. There is  no left ventricular hypertrophy. Left ventricular diastolic parameters are indeterminate. Right Ventricle: The right ventricular size is normal. No increase in right ventricular wall thickness. Right ventricular systolic function is normal. There is normal pulmonary artery systolic pressure. The tricuspid regurgitant velocity is 2.28 m/s, and  with an assumed right atrial pressure of 15 mmHg, the estimated right ventricular systolic pressure is 35.8 mmHg. Left Atrium: Left atrial size was moderately dilated. Right Atrium: Right atrial size was normal in size. Pericardium: There is no evidence of pericardial effusion. Mitral Valve: The mitral valve is normal in structure. No evidence of mitral valve regurgitation. No evidence of mitral valve stenosis. MV peak gradient, 5.4 mmHg. The mean mitral valve gradient is 2.0 mmHg. Tricuspid Valve: The tricuspid valve is normal in structure. Tricuspid valve regurgitation is trivial. Aortic Valve: The aortic valve is tricuspid. Aortic valve regurgitation is not visualized. Aortic valve sclerosis/calcification is present, without any evidence of aortic stenosis. Aortic valve mean gradient measures 8.0 mmHg. Aortic valve peak gradient measures 16.0 mmHg. Aortic valve area, by VTI measures 2.25 cm. Pulmonic Valve: The pulmonic valve was normal in structure. Pulmonic valve regurgitation is not visualized. Aorta: Aortic dilatation noted. There is mild dilatation of the aortic root, measuring 39 mm. Venous: The inferior vena cava is dilated in size with less than 50% respiratory variability, suggesting right atrial pressure of 15 mmHg. IAS/Shunts: No atrial level shunt detected  by color flow Doppler.  LEFT VENTRICLE PLAX 2D LVIDd:         5.00 cm       Diastology LVIDs:         3.60 cm      LV e' medial:    7.94 cm/s LV PW:         0.60 cm      LV E/e' medial:  17.0 LV IVS:        0.90 cm      LV e' lateral:   12.90 cm/s LVOT diam:     2.00 cm      LV E/e' lateral: 10.5 LV SV:         56 LV SV Index:   24 LVOT Area:     3.14 cm  LV Volumes (MOD) LV vol d, MOD A4C: 170.0 ml LV vol s, MOD A4C: 50.6 ml LV SV MOD A4C:     170.0 ml RIGHT VENTRICLE             IVC RV Basal diam:  4.20 cm     IVC diam: 2.50 cm RV Mid diam:    2.80 cm RV S prime:     22.40 cm/s TAPSE (M-mode): 2.0 cm LEFT ATRIUM              Index        RIGHT ATRIUM           Index LA diam:        4.20 cm  1.80 cm/m   RA Area:     17.80 cm LA Vol (A2C):   106.0 ml 45.41 ml/m  RA Volume:   43.80 ml  18.76 ml/m LA Vol (A4C):   86.7 ml  37.14 ml/m LA Biplane Vol: 101.0 ml 43.27 ml/m  AORTIC VALVE                     PULMONIC VALVE AV Area (Vmax):    2.04 cm      PV Vmax:       1.42 m/s AV Area (Vmean):   2.04 cm      PV Peak grad:  8.1 mmHg AV Area (VTI):     2.25 cm AV Vmax:           200.00 cm/s AV Vmean:          125.000 cm/s AV VTI:            0.248 m AV Peak Grad:      16.0 mmHg AV Mean Grad:      8.0 mmHg LVOT Vmax:         130.00 cm/s LVOT Vmean:        81.200 cm/s LVOT VTI:          0.178 m LVOT/AV VTI ratio: 0.72  AORTA Ao Root diam: 3.90 cm Ao Asc diam:  3.30 cm MITRAL VALVE                TRICUSPID VALVE MV Area (PHT): 5.70 cm     TR Peak grad:   20.8 mmHg MV Area VTI:   2.31 cm     TR Vmax:        228.00 cm/s MV Peak grad:  5.4 mmHg MV Mean grad:  2.0 mmHg     SHUNTS MV Vmax:       1.16 m/s     Systemic VTI:  0.18 m MV Vmean:  65.4 cm/s    Systemic Diam: 2.00 cm MV Decel Time: 133 msec MV E velocity: 135.00 cm/s Dalton McleanMD Electronically signed by Archer Bear Signature Date/Time: 07/07/2023/9:53:50 AM    Final    DG CHEST PORT 1 VIEW Result Date: 07/06/2023 CLINICAL DATA:  Weakness altered EXAM: PORTABLE CHEST 1 VIEW COMPARISON:  07/05/2023 FINDINGS: Hypoventilatory  changes. Cardiomegaly with mild central congestion. No consolidation, pleural effusion, or pneumothorax IMPRESSION: Cardiomegaly with mild central congestion. Electronically Signed   By: Esmeralda Hedge M.D.   On: 07/06/2023 18:17    Cardiac Studies     Patient Profile     58 y.o. 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 patient initially presented in to outside hospital with altered mental status and was found to have sepsis secondary to community-acquired pneumonia, acute metabolic encephalopathy secondary to sepsis, AKI, hypercalcemia, symptomatic anemia requiring 3 unit PRBCs, and lytic bone lesions.  Oncology team evaluated the patient and there was concern for new diagnosis of multiple myeloma.   Assessment & Plan    Atrial fibrillation with RVR: Likely caused by acute illness.  CHA2DS2-VASc 2 (diabetes, hypertension).  Echocardiogram with EF 65 to 70%, normal RV function, moderate left atrial enlargement, no significant valve disease, RAP 15 -Started on IV amiodarone , was started within 24 hours of start of Afib onset -heparin  gtt discontinued given bleeding and anemia.  Would continue to hold heparin .   -Appears converted to sinus tach.  Will check 12 lead EKG to confirm. Would continue amio gtt to maintain sinus rhythm.  Can wean off diltiazem  gtt since now in sinus  Acute respiratory failure with hypoxia: On 15L HFNC.  PCCM planning intubation  Suspected multiple myeloma: Oncology following.  On Decadron .  Bone marrow biopsy results pending.  Planning to start treatment with Velcade  and Cytoxan   AKI: Creatinine up to 5.8 on presentation, now 4.7.  Nephrology following, may need CRRT  Anemia: Hgb down to 5.6 today.  Heparin  gtt  discontinued.  Receiving PRBCs  For questions or updates, please contact [facility] Please consult [email or web address] for contact info under        Signed, Wendie Hamburg, MD  07/08/2023, 11:15 AM    CRITICAL CARE TIME: I have spent a total of 37 minutes with patient reviewing hospital notes, telemetry, EKGs, labs and examining the patient as well as establishing an assessment and plan that was discussed with PCCM and TRH.  > 50% of time was spent in direct patient care. The patient is critically ill with multi-organ system failure and requires high complexity decision making for assessment and support, frequent evaluation and titration of therapies, application of advanced monitoring technologies and extensive interpretation of multiple databases.  Wendie Hamburg, MD

## 2023-07-08 NOTE — Progress Notes (Signed)
 PROGRESS NOTE    Frank [name].  UJW:[id number] DOB: 1965/10/02 DOA: 07/03/2023 PCP: Serita Danes, MD   Brief Narrative:  58 y.o. 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  He was admitted on 06/29/2023 through 07/02/2023.  The patient was admitted for sepsis secondary to community-acquired pneumonia, acute metabolic encephalopathy secondary to sepsis, AKI, hypercalcemia, symptomatic anemia requiring 3 unit PRBCs, and lytic bone lesions with concern for multiple myeloma.     The patient was seen by medical oncology as [facility].  He was in the process of a workup for multiple myeloma.  His family requested transfer to [facility] health for continuity of care since most of his providers are from [facility] health.  He had presented at [facility] with 1 week of generalized weakness.  Associated with hemoglobin of 6.1 (received 3 unit PRBCs which improved to 8.5).  Creatinine on admission 5.84 improved to 5.33.  IgG 13428, IgA 85, IgM less than 8, free kappa 158, free lambda 11.6, free kappa lambda ratio 13.50, M spike pending.   He was transferred to [facility] for oncology evaluation.  Nephrology was also consulted.  Course complicated by persistent encephalopathy, A-fib with RVR, fevers.  Cardiology consulted and patient has been on Cardizem , amiodarone  and heparin  drips.  Patient was transferred to [facility] service on 07/06/2023.  He was transferred back to [facility] service from 07/08/2023 onwards.  Assessment & Plan:   Suspected multiple myeloma -Presented with acute encephalopathy, AKI, hypercalcemia, symptomatic anemia and lytic bone lesions concerning for multiple myeloma - Oncology following: Planning to start chemotherapy today.  Status post bone  marrow biopsy by IR on 07/04/2023. - Creatinine level is still elevated at 11.1 today.  Oncology waiting on viscosity labs to decide if patient needs plasma exchange or not.  Hypercalcemia - Possibly from above.  Corrected calcium  is around 9.9 today (albumin  less than 1.5 today) -Patient received a dose of IV Zometa  on 07/03/2023 as per oncology.  Patient apparently received calcitonin at [facility].  He received calcitonin here as well as per oncology. - Patient was also treated with scheduled Lasix  per nephrology which has already been discontinued.  Monitor calcium .  Paroxysmal A-fib with RVR - Still tachycardic.  Currently on Cardizem , amiodarone  and heparin  drips.  Patient having some bloody return while suctioning the back of the throat as per nursing staff.  Will hold heparin  for now.  Might be secondary to NG tube trauma.  Cardiology following.  AKI Acute metabolic acidosis - Presented with creatinine of 5.84 at outside facility.  Creatinine 4.72 today.  Nephrology following.  Monitor.    Recently diagnosed and treated for community-acquired bacterial pneumonia - Received at least 3 days of IV antibiotics at outside hospital.  Patient has been on Rocephin  since admission and received few more days of IV Zithromax  as well.  He is still having intermittent fevers.  Blood cultures have remained negative so far.  Will DC Rocephin  and start Zosyn .  Acute metabolic encephalopathy Concern for acute hepatic encephalopathy -possibly from all of the above.  Still intermittently confused and agitated.  Continue Haldol  and Ativan  as needed along with restraints if needed -Care transferred back to [facility] service from [facility] service from 07/08/2023 onwards -Ammonia still elevated at 116 today.  Continue rectal  lactulose  for now.  Might switch to lactulose  via tube now that patient hasNG tube  Hypertension - Blood pressure currently mostly stable.  Use IV antihypertensives as needed.  Currently on  Cardizem  drip as well.    Anemia of chronic disease -presented with hemoglobin of 6.1 at outside hospital.  Transfused 3 units packed red cells.  Hemoglobin 6.3 on 07/07/2023; received 1 unit packed red cell transfusion.  Repeat hemoglobin was 6.9.  Another unit packed red cell transfusion ordered but patient has not received it because of loss of IV access.  Hemoglobin 5.6 this morning.  Will order 2 more units of packed red cells.  Will ask PCCM to place a central line for better access.  Monitor.    Leukocytosis -Worsening.  Monitor  Thrombocytopenia - Monitor.  Hypokalemia - Monitor.  OSA--continue CPAP at night once encephalopathy improves  Seizure disorder - Continue per tube valproic  acid.  Seizure precautions.  Will check EEG as well since the patient is persistently encephalopathic.  Hypoalbuminemia - Severe.  Start tube feeding as per dietary recommendations.  Diabetes mellitus type 2 - Continue CBGs with SSI  Obesity class I - Outpatient follow-up  Tobacco use disorder - Patient was counseled regarding tobacco cessation by admitting hospitalist    DVT prophylaxis: Hold heparin  drip as discussed above Code Status: Full Family Communication: Wife at bedside on 07/05/2023.  None at bedside today. Disposition Plan: Status is: Inpatient Remains inpatient appropriate because: Of severity of illness    Consultants: Oncology/nephrology/PCCM/IR/cardiology Procedures: As above Antimicrobials:  Anti-infectives (From admission, onward)    Start     Dose/Rate Route Frequency Ordered Stop   07/05/23 2200  azithromycin  (ZITHROMAX ) 500 mg in sodium chloride  0.9 % 250 mL IVPB        500 mg 250 mL/hr over 60 Minutes Intravenous Daily at bedtime 07/05/23 2022 07/06/23 2302   07/05/23 2115  azithromycin  (ZITHROMAX ) 250 mg in dextrose  5 % 125 mL IVPB  Status:  Discontinued       Note to Pharmacy: Missed dose this morning   250 mg 127.5 mL/hr over 60 Minutes Intravenous Every  24 hours 07/05/23 2016 07/05/23 2021   07/04/23 0900  fluconazole  (DIFLUCAN ) IVPB 200 mg        200 mg 100 mL/hr over 60 Minutes Intravenous Every 48 hours 07/04/23 0722 07/12/23 0859   07/03/23 0600  cefTRIAXone  (ROCEPHIN ) 1 g in sodium chloride  0.9 % 100 mL IVPB        1 g 200 mL/hr over 30 Minutes Intravenous Every 24 hours 07/03/23 0508     07/03/23 0600  azithromycin  (ZITHROMAX ) tablet 250 mg  Status:  Discontinued        250 mg Oral Daily 07/03/23 0508 07/05/23 2016         Subjective: Patient seen and examined at bedside.  Continues to remain agitated as per nursing staff requiring IV Haldol  and agitation.  Nursing staff reports bright red bloody return while suctioning the back of the throat.  Continues to have fever.  Objective: Vitals:   07/08/23 0830 07/08/23 0900 07/08/23 0930 07/08/23 1000  BP: 118/72 118/63 108/60 125/66  Pulse: (!) 123 (!) 118 (!) 124 (!) 126  Resp: (!) 27 (!) 36 (!) 33 (!) 33  Temp:      TempSrc:      SpO2: 93% 91% 94% 94%  Weight:      Height:        Intake/Output Summary (Last 24 hours) at 07/08/2023 1036  Last data filed at 07/08/2023 1000 Gross per 24 hour  Intake 2761.21 ml  Output 2475 ml  Net 286.21 ml   Filed Weights   07/03/23 0430 07/05/23 1132 07/08/23 0500  Weight: 110.6 kg 107.3 kg 103.3 kg    Examination:  General: On 15 L oxygen via high flow nasal cannula.  Looks chronically ill and deconditioned. ENT/neck: No obvious JVD elevation or thyromegaly noted respiratory: Bilateral decreased breath sounds at bases with some crackles.  Tachypneic  CVS: S1-S2 heard; tachycardic  abdominal: Soft, obese, nontender, remains distended; no organomegaly, bowel sounds heard  extremities: Mild lower extremity edema present; no cyanosis.  Left upper extremity is in an immobilizer CNS: Wakes up only very slightly, very slow to respond.  No obvious seizure-like activities.   Lymph: No cervical lymphadenopathy  skin: No obvious  ecchymosis/lesions  psych: Mostly flat affect.  Not agitated currently. Musculoskeletal: No obvious other joint swelling/deformity  Data Reviewed: I have personally reviewed following labs and imaging studies  CBC: Recent Labs  Lab 07/03/23 0450 07/04/23 0547 07/05/23 0750 07/06/23 0305 07/07/23 0339 07/07/23 1107 07/07/23 2030  WBC 4.3 5.3 8.6 10.1 13.7*  --   --   NEUTROABS 1.4* 1.2* 4.6 5.5 4.8  --   --   HGB 7.2* 7.5* 7.2* 7.0* 6.3* 6.9*  --   HCT 24.6* 26.1* 24.9* 24.9* 22.2* 23.0*  --   MCV 92.8 93.5 93.6 92.2 94.5  --   --   PLT 154 154 148* 150 140*  --  137*   Basic Metabolic Panel: Recent Labs  Lab 07/03/23 0505 07/04/23 0547 07/05/23 0750 07/06/23 0305 07/07/23 0339 07/08/23 0828  NA 135 138 141 141 144 145  K 3.6 3.5 3.5 3.0* 2.9* 3.3*  CL 112* 113* 114* 115* 117* 119*  CO2 20* 22 21* 21* 22 20*  GLUCOSE 104* 87 130* 137* 175* 182*  BUN 34* 34* 40* 51* 63* 77*  CREATININE 4.32* 4.10* 4.34* 4.64* 4.83* 4.72*  CALCIUM  9.9 10.5* 9.3 8.6* 8.1* 7.9*  MG 1.8 1.6* 1.4* 1.8 2.0 2.1  PHOS 3.8  --   --   --   --   --    GFR: Estimated Creatinine Clearance: 21.6 mL/min (A) (by C-G formula based on SCr of 4.72 mg/dL (H)). Liver Function Tests: Recent Labs  Lab 07/04/23 0547 07/05/23 0750 07/06/23 0305 07/07/23 0339 07/08/23 0828  AST 12* 30 27 13* 27  ALT 8 12 13 11 16   ALKPHOS 31* 34* 30* 31* 37*  BILITOT 0.5 0.7 0.7 0.5 0.5  PROT >12.0* >12.0* >12.0* >12.0* 11.1*  ALBUMIN  <1.5* <1.5* 1.5* <1.5* <1.5*   No results for input(s): "LIPASE", "AMYLASE" in the last 168 hours. Recent Labs  Lab 07/05/23 1716 07/07/23 0339 07/08/23 0828  AMMONIA 81* 70* 116*   Coagulation Profile: Recent Labs  Lab 07/05/23 2227 07/07/23 2030  INR 2.1* 1.9*   Cardiac Enzymes: No results for input(s): "CKTOTAL", "CKMB", "CKMBINDEX", "TROPONINI" in the last 168 hours. BNP (last 3 results) No results for input(s): "PROBNP" in the last 8760 hours. HbA1C: No results  for input(s): "HGBA1C" in the last 72 hours. CBG: Recent Labs  Lab 07/07/23 2125 07/08/23 0245 07/08/23 0555 07/08/23 0742 07/08/23 1033  GLUCAP 156* 152* 169* 167* 172*   Lipid Profile: No results for input(s): "CHOL", "HDL", "LDLCALC", "TRIG", "CHOLHDL", "LDLDIRECT" in the last 72 hours. Thyroid Function Tests: No results for input(s): "TSH", "T4TOTAL", "FREET4", "T3FREE", "THYROIDAB" in the last 72 hours. Anemia Panel: No  results for input(s): "VITAMINB12", "FOLATE", "FERRITIN", "TIBC", "IRON", "RETICCTPCT" in the last 72 hours. Sepsis Labs: Recent Labs  Lab 07/03/23 0450 07/08/23 0828  PROCALCITON 0.17 0.54    Recent Results (from the past 240 hours)  MRSA Next Gen by PCR, Nasal     Status: None   Collection Time: 07/05/23 11:45 AM   Specimen: Nasal Mucosa; Nasal Swab  Result Value Ref Range Status   MRSA by PCR Next Gen NOT DETECTED NOT DETECTED Final    Comment: (NOTE) The GeneXpert MRSA Assay (FDA approved for NASAL specimens only), is one component of a comprehensive MRSA colonization surveillance program. It is not intended to diagnose MRSA infection nor to guide or monitor treatment for MRSA infections. Test performance is not FDA approved in patients less than 55 years old. Performed at [facility], [address], [place], [place] [address]   Culture, blood (Routine X 2) w Reflex to ID Panel     Status: None (Preliminary result)   Collection Time: 07/05/23 12:49 PM   Specimen: BLOOD LEFT HAND  Result Value Ref Range Status   Specimen Description   Final    BLOOD LEFT HAND Performed at [facility], [address], [place], [place] [address]    Special Requests   Final    BOTTLES DRAWN AEROBIC AND ANAEROBIC Blood Culture results may not be optimal due to an inadequate volume of blood received in culture bottles Performed at [facility], [address] [place], [place] [address]    Culture   Final    NO  GROWTH 3 DAYS Performed at [facility], [address], [place], [place] [address]    Report Status PENDING  Incomplete  Culture, blood (Routine X 2) w Reflex to ID Panel     Status: None (Preliminary result)   Collection Time: 07/05/23 12:57 PM   Specimen: BLOOD LEFT HAND  Result Value Ref Range Status   Specimen Description   Final    BLOOD LEFT HAND Performed at [facility], [address] [place], [place] [address]    Special Requests   Final    BOTTLES DRAWN AEROBIC AND ANAEROBIC Blood Culture results may not be optimal due to an inadequate volume of blood received in culture bottles Performed at [facility], [address] [place], [place] [address]    Culture   Final    NO GROWTH 3 DAYS Performed at [facility], [address], [place], [place] [address]    Report Status PENDING  Incomplete  Urine Culture (for pregnant, neutropenic or urologic patients or patients with an indwelling urinary catheter)     Status: None   Collection Time: 07/05/23  7:13 PM   Specimen: Urine, Clean Catch  Result Value Ref Range Status   Specimen Description   Final    URINE, CLEAN CATCH Performed at [facility], [address], [place], [place] [address]    Special Requests   Final    NONE Performed at [facility], [address], [place], [place] [address]    Culture   Final    NO GROWTH Performed at [facility], [address], [place], [place] [address]    Report Status 07/06/2023 FINAL  Final         Radiology Studies: DG CHEST PORT 1 VIEW Result Date: 07/08/2023 CLINICAL DATA:  Dyspnea. EXAM: PORTABLE CHEST 1 VIEW COMPARISON:  07/06/2023 FINDINGS: Feeding tube extends down the esophagus into the stomach. Tube appears to be coiled in the stomach but the tip is not clearly identified. Heart size is within normal limits and stable. Prominent vascular and interstitial lung markings are  unchanged. No focal airspace disease or lung consolidation. Again noted is severe joint space narrowing and probable degenerative changes in both shoulders. Trachea is midline. IMPRESSION: 1. Again noted are prominent vascular and interstitial lung markings. Findings could represent vascular congestion or mild edema. Minimal change since 07/06/2023. 2. Feeding tube extends into the abdomen. Electronically Signed   By: Elene Griffes M.D.   On: 07/08/2023 10:25   DG Abd 1 View Result Date: 07/07/2023 CLINICAL DATA:  Nasogastric tube placement. EXAM: ABDOMEN - 1 VIEW COMPARISON:  None Available. FINDINGS: Weighted enteric tube tip in the left upper abdomen in the region of the mid or distal stomach. No bowel dilatation in the included upper abdomen. IMPRESSION: Weighted enteric tube tip in the mid or distal stomach. Electronically Signed   By: Chadwick Colonel M.D.   On: 07/07/2023 17:08   DG Fluoro Rm 1-60 Min - No Report Result Date: 07/07/2023 CLINICAL DATA:  Acute encephalopathy, nasogastric tube placement requested. EXAM: FLUORO RM 1-60 MIN-NO REPORT FLUOROSCOPY: Fluoroscopy Time:  0 minutes 48 seconds Radiation Exposure Index (if provided by the fluoroscopic device): 7.0 mGy Number of Acquired Spot Images: 1 COMPARISON:  None Available. FINDINGS: Multiple attempts at nasogastric tube placement with a 20 French catheter resulted in right or left mainstem bronchus positioning. Patient was unable to swallow or follow directions to aid with insertion. Study was terminated. IMPRESSION: Unsuccessful nasogastric tube placement under fluoroscopy. Electronically Signed   By: Shearon Denis M.D.   On: 07/07/2023 14:13   ECHOCARDIOGRAM COMPLETE Result Date: 07/07/2023    ECHOCARDIOGRAM REPORT   Patient Name:   Moroni Nester. Date of Exam: 07/07/2023 Medical Rec #:  [id number]          Height:       74.0 in Accession #:    [id number]         Weight:       236.6 lb Date of Birth:  1966-03-12          BSA:           2.334 m Patient Age:    58 years           BP:           141/88 mmHg Patient Gender: M                  HR:           110 bpm. Exam Location:  Inpatient Procedure: 3D Echo, Cardiac Doppler, Color Doppler and Intracardiac            Opacification Agent (Both Spectral and Color Flow Doppler were            utilized during procedure). Indications:    Atrial fibrillation  History:        Patient has no prior history of Echocardiogram examinations.  Sonographer:    Juanita Shaw Referring Phys: [id number] PARKER  Sonographer Comments: Image acquisition challenging due to patient body habitus and Image acquisition challenging due to respiratory motion. IMPRESSIONS  1. Left ventricular ejection fraction, by estimation, is 65 to 70%. The left ventricle has normal function. The left ventricle has no regional wall motion abnormalities. Left ventricular diastolic parameters are indeterminate.  2. Right  ventricular systolic function is normal. The right ventricular size is normal. There is normal pulmonary artery systolic pressure. The estimated right ventricular systolic pressure is 35.8 mmHg.  3. Left atrial size was moderately dilated.  4. The mitral valve is normal in structure. No evidence of mitral valve regurgitation. No evidence of mitral stenosis.  5. The aortic valve is tricuspid. Aortic valve regurgitation is not visualized. Aortic valve sclerosis/calcification is present, without any evidence of aortic stenosis. Aortic valve mean gradient measures 8.0 mmHg.  6. Aortic dilatation noted. There is mild dilatation of the aortic root, measuring 39 mm.  7. The inferior vena cava is dilated in size with <50% respiratory variability, suggesting right atrial pressure of 15 mmHg.  8. The patient was in atrial fibrillation. FINDINGS  Left Ventricle: Left ventricular ejection fraction, by estimation, is 65 to 70%. The left ventricle has normal function. The left ventricle has no regional wall motion abnormalities. The left  ventricular internal cavity size was normal in size. There is  no left ventricular hypertrophy. Left ventricular diastolic parameters are indeterminate. Right Ventricle: The right ventricular size is normal. No increase in right ventricular wall thickness. Right ventricular systolic function is normal. There is normal pulmonary artery systolic pressure. The tricuspid regurgitant velocity is 2.28 m/s, and  with an assumed right atrial pressure of 15 mmHg, the estimated right ventricular systolic pressure is 35.8 mmHg. Left Atrium: Left atrial size was moderately dilated. Right Atrium: Right atrial size was normal in size. Pericardium: There is no evidence of pericardial effusion. Mitral Valve: The mitral valve is normal in structure. No evidence of mitral valve regurgitation. No evidence of mitral valve stenosis. MV peak gradient, 5.4 mmHg. The mean mitral valve gradient is 2.0 mmHg. Tricuspid Valve: The tricuspid valve is normal in structure. Tricuspid valve regurgitation is trivial. Aortic Valve: The aortic valve is tricuspid. Aortic valve regurgitation is not visualized. Aortic valve sclerosis/calcification is present, without any evidence of aortic stenosis. Aortic valve mean gradient measures 8.0 mmHg. Aortic valve peak gradient measures 16.0 mmHg. Aortic valve area, by VTI measures 2.25 cm. Pulmonic Valve: The pulmonic valve was normal in structure. Pulmonic valve regurgitation is not visualized. Aorta: Aortic dilatation noted. There is mild dilatation of the aortic root, measuring 39 mm. Venous: The inferior vena cava is dilated in size with less than 50% respiratory variability, suggesting right atrial pressure of 15 mmHg. IAS/Shunts: No atrial level shunt detected by color flow Doppler.  LEFT VENTRICLE PLAX 2D LVIDd:         5.00 cm      Diastology LVIDs:         3.60 cm      LV e' medial:    7.94 cm/s LV PW:         0.60 cm      LV E/e' medial:  17.0 LV IVS:        0.90 cm      LV e' lateral:   12.90 cm/s  LVOT diam:     2.00 cm      LV E/e' lateral: 10.5 LV SV:         56 LV SV Index:   24 LVOT Area:     3.14 cm  LV Volumes (MOD) LV vol d, MOD A4C: 170.0 ml LV vol s, MOD A4C: 50.6 ml LV SV MOD A4C:     170.0 ml RIGHT VENTRICLE             IVC RV Basal diam:  4.20 cm     IVC diam: 2.50 cm RV Mid diam:    2.80 cm RV S prime:     22.40 cm/s TAPSE (M-mode): 2.0 cm LEFT ATRIUM              Index        RIGHT ATRIUM           Index LA diam:        4.20 cm  1.80 cm/m   RA Area:     17.80 cm LA Vol (A2C):   106.0 ml 45.41 ml/m  RA Volume:   43.80 ml  18.76 ml/m LA Vol (A4C):   86.7 ml  37.14 ml/m LA Biplane Vol: 101.0 ml 43.27 ml/m  AORTIC VALVE                     PULMONIC VALVE AV Area (Vmax):    2.04 cm      PV Vmax:       1.42 m/s AV Area (Vmean):   2.04 cm      PV Peak grad:  8.1 mmHg AV Area (VTI):     2.25 cm AV Vmax:           200.00 cm/s AV Vmean:          125.000 cm/s AV VTI:            0.248 m AV Peak Grad:      16.0 mmHg AV Mean Grad:      8.0 mmHg LVOT Vmax:         130.00 cm/s LVOT Vmean:        81.200 cm/s LVOT VTI:          0.178 m LVOT/AV VTI ratio: 0.72  AORTA Ao Root diam: 3.90 cm Ao Asc diam:  3.30 cm MITRAL VALVE                TRICUSPID VALVE MV Area (PHT): 5.70 cm     TR Peak grad:   20.8 mmHg MV Area VTI:   2.31 cm     TR Vmax:        228.00 cm/s MV Peak grad:  5.4 mmHg MV Mean grad:  2.0 mmHg     SHUNTS MV Vmax:       1.16 m/s     Systemic VTI:  0.18 m MV Vmean:      65.4 cm/s    Systemic Diam: 2.00 cm MV Decel Time: 133 msec MV E velocity: 135.00 cm/s Dalton McleanMD Electronically signed by Archer Bear Signature Date/Time: 07/07/2023/9:53:50 AM    Final    DG CHEST PORT 1 VIEW Result Date: 07/06/2023 CLINICAL DATA:  Weakness altered EXAM: PORTABLE CHEST 1 VIEW COMPARISON:  07/05/2023 FINDINGS: Hypoventilatory changes. Cardiomegaly with mild central congestion. No consolidation, pleural effusion, or pneumothorax IMPRESSION: Cardiomegaly with mild central congestion. Electronically  Signed   By: Esmeralda Hedge M.D.   On: 07/06/2023 18:17        Scheduled Meds:  bortezomib  SQ  1.3 mg/m2 (Treatment Plan Recorded) Subcutaneous Once   cyclophosphamide   400 mg/m2 (Treatment Plan Recorded) Intravenous Once   famotidine   10.4 mg Per Tube Daily   feeding supplement  237 mL Oral BID BM   feeding supplement (VITAL HIGH PROTEIN)  1,000 mL Per Tube Q24H   insulin  aspart  0-9 Units Subcutaneous Q4H   lactulose   300 mL Rectal BID   nicotine   14 mg Transdermal Daily  mouth rinse  15 mL Mouth Rinse 4 times per day   [START ON 07/09/2023] pneumococcal 20-valent conjugate vaccine  0.5 mL Intramuscular Tomorrow-1000   valproic  acid  250 mg Per Tube Q6H   Continuous Infusions:  amiodarone  30 mg/hr (07/08/23 1000)   cefTRIAXone  (ROCEPHIN )  IV Stopped (07/08/23 0617)   diltiazem  (CARDIZEM ) infusion 15 mg/hr (07/08/23 1000)   fluconazole  (DIFLUCAN ) IV Stopped (07/08/23 1610)          Audria Leather, MD [facility] 07/08/2023, 10:36 AM

## 2023-07-08 NOTE — Progress Notes (Signed)
 Chemo RN presented to patient room. Patient verified using two separate identifiers. Verbal Consent for chemotherapy obtained from wife, Daxtyn Rottenberg over the telephone. Questions answered to her satisfaction. 20G PIV placed today by IV team checked for blood return, flushed easily. Premed given and allowed time to work. Chemotherapy treatment initiated and completed.

## 2023-07-08 NOTE — Progress Notes (Signed)
 NAME:  Frank [name]., MRN:  [id number], DOB:  [birth year], LOS: 5 ADMISSION DATE:  07/03/2023, CONSULTATION DATE:  07/06/23 REFERRING MD:  Dr Maury Space, CHIEF COMPLAINT:  Encephalopathy   History of Present Illness:  Asked to see patient for encephalopathy   Transferred from [facility] with 1 week of generalized weakness, altered mentation, decreased appetite Recently being worked up for multiple myeloma.  Transferred to [facility] for oncology evaluation Nephrology consulted for AKI   Background history of obesity, asthma, obstructive sleep apnea on CPAP GERD, hypertension, hyperlipidemia Has been feeling weak for the last few days according to spouse Admitted with concern for sepsis, community-acquired pneumonia, antibiotic encephalopathy Workup revealed lytic bony lesions with concern for multiple myeloma Transfused with 3 units packed red blood cells Evaluation so far-bone marrow 07/04/2023  Pertinent  Medical History   Past Medical History:  Diagnosis Date   Anxiety    Arthritis    Asthma    Bipolar disorder (HCC)    Current every day smoker    Depression    Diabetes mellitus, type II (HCC)    Dyspnea    History of kidney stones    History of pneumonia    Hyperlipidemia    Hypertension    Morbid obesity (HCC)    Sedentary lifestyle    Seizures (HCC)    last seizure 12 yrs ago, no current problem   Sleep apnea    uses CPAP nightly   Significant Hospital Events: Including procedures, antibiotic start and stop dates in addition to other pertinent events   4/19-blood cultures, MRSA PCR negative 4/19 chest x-ray with mild pulmonary congestion 4/20 atrial fibrillation with RVR 4/21 nosebleed after IR NG attempt, NG placed by PCCM in afternoon, stable transferred to [facility]   Interim History / Subjective:  Encephalopathy persists, appears to be aspirating oral secretions in the setting of encephalopathy.  Started chemotherapy.  Got blood transfusions for ongoing worsening  anemia.  Increased work of breathing led to decision to intubate after discussion with family.  Subsequent central line placement for access as well as hypotension following intubation.  Objective   Blood pressure (!) 84/48, pulse 100, temperature 99.1 F (37.3 C), temperature source Oral, resp. rate (!) 31, height 6\' 2"  (1.88 m), weight 103.3 kg, SpO2 98%.    Vent Mode: PRVC FiO2 (%):  [40 %-100 %] 40 % Set Rate:  [20 bmp] 20 bmp Vt Set:  [650 mL] 650 mL PEEP:  [8 cmH20] 8 cmH20   Intake/Output Summary (Last 24 hours) at 07/08/2023 1527 Last data filed at 07/08/2023 1359 Gross per 24 hour  Intake 3293.99 ml  Output 2025 ml  Net 1268.99 ml   Filed Weights   07/03/23 0430 07/05/23 1132 07/08/23 0500  Weight: 110.6 kg 107.3 kg 103.3 kg    Examination: General: Appears older than stated age, dried bloody secretions stain mouth and Lungs: Tachypnea, prior Cardiovascular: Tachycardic Abdomen: Nondistended Extremities: No clubbing, no edema, increased circumference right thigh compared to left Neuro: Minimally arousable, not following commands   Resolved Hospital Problem list     Assessment & Plan:  Acute hypoxemic respiratory failure: Most likely in the setting of encephalopathy and aspiration of oropharyngeal secretions with appearance of bloody frothy secretions through trachea during intubation.  Also possible combination of volume overload in setting of renal failure and blood products, increased fluid load from chemotherapy etc. -- PRVC, VAP bundle, stress ulcer prophylaxis -- Follow-up chest x-ray -- zosyn   Multiple myeloma - Presented with  encephalopathy, AKI, hypocalcemia, lytic bone lesions, Elevated immunoglobulins - Bone marrow results consistent with myeloma - Appreciate hematology/oncology assistance, started chemo 4/22 - Trend TLS labs  Hypercalcemia-resolved - Did receive IV Zometa  - Received calcitonin - s/p lasix   Paroxysmal atrial fibrillation with RVR:  converted to ST 4/22 AM - d/c cardizem  drip - Continue amio drip - stop heparin  drip with bleeding anemia - Cardiology consulted, appreciate assistance  Acute kidney injury: Creatinine, BUN slowly rising but urine output appears to be picking up which is encouraging - Avoid nephrotoxic medications - Nephrology consult, appreciate assistance  Fever: Related to pneumonia, possible myeloma. -on antibiotics -cultures 4/19- no growth  Anemia: Related to myeloma, lab checks, prior hydration with hypercalcemia, blood loss concern for hematoma right groin - Monitor - Requiring serial transfusions - heparin  stopped  Acute metabolic encephalopathy - Multifactorial, sepsis, hypercalcemia, myeloma - TF started - lactulose  TID  Hypotension: sedation related - MAP goal 65, NE ordered  History of obstructive sleep apnea - CPAP at night if improved  History of seizure disorder - Continue seizure precautions - Continue IV Depakote , can switch to orals once gastric access obtained  Hypoalbuminemia - Continue to monitor - Cortrak will be placed - Will start enteric nutrition once able to  Type 2 diabetes - Continue SSI   Best Practice (right click and "Reselect all SmartList Selections" daily)   Diet/type: NPO DVT prophylaxis SCD Pressure ulcer(s): N/A GI prophylaxis: N/A Lines: N/A Foley:  N/A Code Status:  full code Last date of multidisciplinary goals of care discussion [wife updated conformed full code 4/22]  Labs   CBC: Recent Labs  Lab 07/04/23 0547 07/05/23 0750 07/06/23 0305 07/07/23 0339 07/07/23 1107 07/07/23 2030 07/08/23 0828  WBC 5.3 8.6 10.1 13.7*  --   --  15.7*  NEUTROABS 1.2* 4.6 5.5 4.8  --   --  7.9*  HGB 7.5* 7.2* 7.0* 6.3* 6.9*  --  5.6*  HCT 26.1* 24.9* 24.9* 22.2* 23.0*  --  19.5*  MCV 93.5 93.6 92.2 94.5  --   --  97.5  PLT 154 148* 150 140*  --  137* 147*    Basic Metabolic Panel: Recent Labs  Lab 07/03/23 0505 07/04/23 0547  07/05/23 0750 07/06/23 0305 07/07/23 0339 07/08/23 0828  NA 135 138 141 141 144 145  K 3.6 3.5 3.5 3.0* 2.9* 3.3*  CL 112* 113* 114* 115* 117* 119*  CO2 20* 22 21* 21* 22 20*  GLUCOSE 104* 87 130* 137* 175* 182*  BUN 34* 34* 40* 51* 63* 77*  CREATININE 4.32* 4.10* 4.34* 4.64* 4.83* 4.72*  CALCIUM  9.9 10.5* 9.3 8.6* 8.1* 7.9*  MG 1.8 1.6* 1.4* 1.8 2.0 2.1  PHOS 3.8  --   --   --   --   --    GFR: Estimated Creatinine Clearance: 21.6 mL/min (A) (by C-G formula based on SCr of 4.72 mg/dL (H)). Recent Labs  Lab 07/03/23 0450 07/04/23 0547 07/05/23 0750 07/06/23 0305 07/07/23 0339 07/08/23 0828  PROCALCITON 0.17  --   --   --   --  0.54  WBC 4.3   < > 8.6 10.1 13.7* 15.7*   < > = values in this interval not displayed.    Liver Function Tests: Recent Labs  Lab 07/04/23 0547 07/05/23 0750 07/06/23 0305 07/07/23 0339 07/08/23 0828  AST 12* 30 27 13* 27  ALT 8 12 13 11 16   ALKPHOS 31* 34* 30* 31* 37*  BILITOT 0.5 0.7 0.7 0.5  0.5  PROT >12.0* >12.0* >12.0* >12.0* 11.1*  ALBUMIN  <1.5* <1.5* 1.5* <1.5* <1.5*   No results for input(s): "LIPASE", "AMYLASE" in the last 168 hours. Recent Labs  Lab 07/05/23 1716 07/07/23 0339 07/08/23 0828  AMMONIA 81* 70* 116*    ABG    Component Value Date/Time   PHART 7.33 (L) 07/08/2023 1252   PCO2ART 47 07/08/2023 1252   PO2ART 369 (H) 07/08/2023 1252   HCO3 24.3 07/08/2023 1252   TCO2 28 01/29/2023 1044   ACIDBASEDEF 1.5 07/08/2023 1252   O2SAT 100 07/08/2023 1252     Coagulation Profile: Recent Labs  Lab 07/05/23 2227 07/07/23 2030  INR 2.1* 1.9*    Cardiac Enzymes: No results for input(s): "CKTOTAL", "CKMB", "CKMBINDEX", "TROPONINI" in the last 168 hours.  HbA1C: Hgb A1c MFr Bld  Date/Time Value Ref Range Status  01/29/2023 02:40 PM 6.7 (H) 4.8 - 5.6 % Final    Comment:    (NOTE) Pre diabetes:          5.7%-6.4%  Diabetes:              >6.4%  Glycemic control for   <7.0% adults with diabetes    11/12/2021 11:30 AM 6.5 (H) 4.8 - 5.6 % Final    Comment:    (NOTE) Pre diabetes:          5.7%-6.4%  Diabetes:              >6.4%  Glycemic control for   <7.0% adults with diabetes     CBG: Recent Labs  Lab 07/07/23 2125 07/08/23 0245 07/08/23 0555 07/08/23 0742 07/08/23 1033  GLUCAP 156* 152* 169* 167* 172*    Review of Systems:   Unable to provide history  Past Medical History:  He,  has a past medical history of Anxiety, Arthritis, Asthma, Bipolar disorder (HCC), Current every day smoker, Depression, Diabetes mellitus, type II (HCC), Dyspnea, History of kidney stones, History of pneumonia, Hyperlipidemia, Hypertension, Morbid obesity (HCC), Sedentary lifestyle, Seizures (HCC), and Sleep apnea.   Surgical History:   Past Surgical History:  Procedure Laterality Date   CARDIAC CATHETERIZATION  2020   carpal tunel Left    COLONOSCOPY     HARDWARE REMOVAL Left 07/26/2022   Procedure: HARDWARE REMOVAL ELBOW;  Surgeon: Laneta Pintos, MD;  Location: [facility] OR;  Service: Orthopedics;  Laterality: Left;   ORIF HUMERUS FRACTURE Left 01/16/2022   Procedure: OPEN REDUCTION INTERNAL FIXATION (ORIF) DISTAL HUMERUS FRACTURE;  Surgeon: Laneta Pintos, MD;  Location: [facility] OR;  Service: Orthopedics;  Laterality: Left;   ORIF HUMERUS FRACTURE Left 01/29/2023   Procedure: OPEN REDUCTION INTERNAL FIXATION (ORIF) DISTAL HUMERUS FRACTURE;  Surgeon: Laneta Pintos, MD;  Location: [facility] OR;  Service: Orthopedics;  Laterality: Left;   OTHER SURGICAL HISTORY     R & L shoulder   OTHER SURGICAL HISTORY Right    Knee surgery x several   TOTAL HIP ARTHROPLASTY Left 12/26/2017   Procedure: LEFT TOTAL HIP ARTHROPLASTY ANTERIOR APPROACH;  Surgeon: Arnie Lao, MD;  Location: [facility];  Service: Orthopedics;  Laterality: Left;   TOTAL KNEE ARTHROPLASTY Left 11/23/2021   Procedure: LEFT TOTAL KNEE ARTHROPLASTY;  Surgeon: Arnie Lao, MD;  Location: [facility];  Service: Orthopedics;   Laterality: Left;     Social History:   reports that he has been smoking cigarettes. He has a 12.5 pack-year smoking history. He has never used smokeless tobacco. He reports current drug use. Drug: Marijuana. He reports  that he does not drink alcohol.   Family History:  His family history includes Diabetes in his brother, father, mother, and sister; Heart attack in his father and mother; Hypertension in his brother, father, mother, and sister; Osteoporosis in his mother.   Allergies Allergies  Allergen Reactions   Morphine And Codeine Anaphylaxis   Shellfish Allergy Anaphylaxis   Betadine  [Povidone Iodine ] Itching   Bupropion Other (See Comments)    Shaking of the body, hallucinations    Chlorhexidine  Hives    Patient reports never had issues CHG with mouth rinse.   Influenza Vaccines Hives   Metoprolol  Rash     Home Medications  Prior to Admission medications   Medication Sig Start Date End Date Taking? Authorizing Provider  albuterol  (PROVENTIL  HFA;VENTOLIN  HFA) 108 (90 Base) MCG/ACT inhaler Inhale 2 puffs into the lungs every 6 (six) hours as needed for wheezing or shortness of breath.   Yes [provider]  albuterol  (PROVENTIL ) (2.5 MG/3ML) 0.083% nebulizer solution 2.5 mg every 2 (two) hours as needed for wheezing or shortness of breath.   Yes [provider]  aspirin  EC 81 MG tablet Take 81 mg by mouth daily. Swallow whole.   Yes [provider]  busPIRone  (BUSPAR ) 15 MG tablet Take 15 mg by mouth at bedtime.   Yes [provider]  celecoxib  (CELEBREX ) 200 MG capsule Take 200 mg by mouth daily.   Yes [provider]  clonazePAM (KLONOPIN) 1 MG tablet Take 1 mg by mouth once as needed for anxiety.   Yes [provider]  cyclobenzaprine  (FLEXERIL ) 5 MG tablet Take 1 tablet (5 mg total) by mouth 3 (three) times daily as needed for muscle spasms. Patient taking differently: Take 5 mg by mouth daily as needed for muscle spasms.  01/30/23  Yes Versie Gores, PA-C  divalproex  (DEPAKOTE ) 500 MG DR tablet Take 500 mg by mouth 2 (two) times daily.   Yes [provider]  EPINEPHrine  0.3 mg/0.3 mL IJ SOAJ injection Inject 0.3 mg into the muscle as needed for anaphylaxis. 08/28/21  Yes [provider]  esomeprazole (NEXIUM) 40 MG capsule Take 40 mg by mouth in the morning.   Yes [provider]  Fluticasone -Salmeterol (ADVAIR) 500-50 MCG/DOSE AEPB Inhale 2 puffs into the lungs in the morning.   Yes [provider]  furosemide  (LASIX ) 40 MG tablet Take 40 mg by mouth daily as needed for fluid or edema.   Yes [provider]  gabapentin  (NEURONTIN ) 300 MG capsule Take 300-600 mg by mouth See admin instructions. 300 mg in the morning, 600 mg at bedtime   Yes [provider]  hydrALAZINE  (APRESOLINE ) 10 MG tablet Take 10 mg by mouth in the morning and at bedtime.   Yes [provider]  hydrochlorothiazide  (HYDRODIURIL ) 25 MG tablet Take 25 mg by mouth daily.   Yes [provider]  lisinopril  (ZESTRIL ) 40 MG tablet Take 40 mg by mouth daily. 03/25/23  Yes [provider]  metFORMIN  (GLUCOPHAGE ) 1000 MG tablet Take 1,000 mg by mouth 2 (two) times daily with a meal.   Yes [provider]  montelukast  (SINGULAIR ) 10 MG tablet Take 10 mg by mouth at bedtime.   Yes [provider]  OVER THE COUNTER MEDICATION Take 400 mg by mouth at bedtime. Burdock root   Yes [provider]  oxyCODONE  (ROXICODONE ) 5 MG immediate release tablet Take 1 tablet (5 mg total) by mouth every 4 (four) hours as needed for severe  pain (pain score 7-10). 01/30/23  Yes Versie Gores, PA-C  PARoxetine  (PAXIL ) 40 MG tablet Take 40 mg by mouth every morning.   Yes [provider]  spironolactone  (ALDACTONE ) 25 MG tablet Take 25 mg by mouth at bedtime. 10/07/21  Yes [provider]  tamsulosin  (FLOMAX ) 0.4 MG CAPS capsule Take 0.4 mg by mouth at  bedtime. 10/07/21  Yes [provider]  testosterone  cypionate (DEPOTESTOSTERONE CYPIONATE) 200 MG/ML injection Inject 0.5 mLs (100 mg total) into the muscle every 14 (fourteen) days. 09/17/16  Yes Nida, Gebreselassie W, MD  verapamil  (CALAN -SR) 120 MG CR tablet Take 120 mg by mouth daily.   Yes [provider]  Vitamin D , Ergocalciferol , (DRISDOL ) 1.25 MG (50000 UNIT) CAPS capsule Take 1 capsule (50,000 Units total) by mouth every Thursday. Patient taking differently: Take 50,000 Units by mouth once a week. Tuesday 02/06/23  Yes Versie Gores, PA-C  buPROPion (WELLBUTRIN XL) 150 MG 24 hr tablet Take 150 mg by mouth daily. Patient not taking: Reported on 07/04/2023    [provider]    CRITICAL CARE Performed by: Archer Kobs Mariabelen Pressly   Total critical care time: 42 minutes  Critical care time was exclusive of separately billable procedures and treating other patients.  Critical care was necessary to treat or prevent imminent or life-threatening deterioration.  Critical care was time spent personally by me on the following activities: development of treatment plan with patient and/or surrogate as well as nursing, discussions with consultants, evaluation of patient's response to treatment, examination of patient, obtaining history from patient or surrogate, ordering and performing treatments and interventions, ordering and review of laboratory studies, ordering and review of radiographic studies, pulse oximetry and re-evaluation of patient's condition.  Guerry Leek, MD [facility] Personal contact: See Amion If unanswered, please page CCM On-call: #[phone]

## 2023-07-08 NOTE — Progress Notes (Signed)
 SLP Cancellation Note  Patient Details Name: Frank [name]. MRN: [id number] DOB: 1965/08/29   Cancelled treatment:       Reason Eval/Treat Not Completed: Medical issues which prohibited therapy- pt has been intubated. Our service will sign off.  [name], MA CCC/SLP Clinical Specialist - Acute Care SLP Acute Rehabilitation Services Office number [phone]    [name] [facility] 07/08/2023, 4:37 PM

## 2023-07-08 NOTE — Progress Notes (Addendum)
 Ok to proceed with Velcade  and Cytoxan  today with Hg 6.9, elevated Scr and elevated HR. Antiviral px to be started at a later time. Pt recently received HD dex on 4/18-4/20. Dex premedication deleted for today. Aloxi  added.  Jobe Mulder [place], [place], BCPS, BCOP 07/08/2023 7:35 AM

## 2023-07-08 NOTE — Procedures (Signed)
 Intubation Procedure Note  Frank [name]  [id number]  09-23-1965  Date:07/08/23  Time:11:34 AM   Provider Performing:Giovanie Lefebre R Domanick Cuccia    Procedure: Intubation (31500)  Indication(s) Respiratory Failure  Consent Risks of the procedure as well as the alternatives and risks of each were explained to the patient and/or caregiver.  Consent for the procedure was obtained and is signed in the bedside chart   Anesthesia Versed , Fentanyl , and Rocuronium    Time Out Verified patient identification, verified procedure, site/side was marked, verified correct patient position, special equipment/implants available, medications/allergies/relevant history reviewed, required imaging and test results available.   Sterile Technique Usual hand hygeine, masks, and gloves were used   Procedure Description Patient positioned in bed supine.  Sedation given as noted above.  Patient was intubated with endotracheal tube using Glidescope.  View was Grade 1 full glottis . Frothy bloody secretions noted emanating from trachea as well as bloody secretions in oropharynx. Number of attempts was 1.  Colorimetric CO2 detector was consistent with tracheal placement.   Complications/Tolerance None; patient tolerated the procedure well. Chest X-ray is ordered to verify placement.   EBL none

## 2023-07-08 NOTE — Progress Notes (Signed)
 Notified Lab Victorino Dike) that ABG being sent for analysis.

## 2023-07-08 NOTE — Progress Notes (Addendum)
 Pt seen after intubation for Cvc placement. When assessing vessels noted a large protuberant hard R groin lesion with no ecchymosis petechiae etc. There is some ecchymosis R flank.  Chronicity unclear, possible acute.  With low hgb (which may just be MM related) want to r/o groin hematoma, RPB.   P -CT a/p    Eston Hence MSN, AGACNP-BC [facility]monary/Critical Care Medicine 07/08/2023, 12:27 PM

## 2023-07-08 NOTE — Progress Notes (Signed)
 [facility] NEPHROLOGY PROGRESS NOTE  Assessment/ Plan: Pt is a 58 y.o. yo male with past medical history significant for HTN, HLD, type II DM, BPH, seizure who was initially presented at [facility] due to AMS, admitted for sepsis due to pneumonia, encephalopathy, sepsis, AKI, hypercalcemia symptomatic anemia and [place] pulm lesion concern for multiple myeloma.  # AKI on CKD 3a - b/l creat 1.1- 1.5 from 2024, eGFR 53- >60 ml/min. Creat here 5.8 on presentation at OSH and last several days in the 4.0- 4.8 range. AKI due to possible myeloma kidney +/- hyperCa +/- ACEi (home med).  Initially treated with IV fluid and then required Lasix  because of fluid overload.  He remains nonoliguric. Treatment of multiple myeloma per oncology. May need CRRT or dialysis if no improvement in renal function.  I will order a few doses of albumin .  Continue with strict ins and outs and daily lab.  # Multiple myeloma: Noted oncology is planning to treat with Velcade  and Cytoxan .  Per oncology, awaiting viscosity level for plasma exchange.    # HTN -monitor BP.  IV antihypertensives as needed.   #Volume -required IV diuretics which is off now.  Monitor.  # Atrial fib- started on IV amio and IV diltiazem .  #Hypercalcemia -  resolved  now. Possible myeloma, w/u in progress, oncology following. SP zometa  here and calcitonin.   #Multifocal PNA - on IV abx  #Anemia -getting PRBC transfusion.  # AMS/acute metabolic encephalopathy.  Concerned about hyperviscosity.  Oncology team and PCCM following.  # Hypokalemia: Replete potassium chloride .  Discussed with primary team, pulmonary, oncology and bedside nurse.   Subjective: Seen and examined in ICU.  Remains confused.  Labs pending from this morning.  Urine output around 1 L.   Objective Vital signs in last 24 hours: Vitals:   07/08/23 0803 07/08/23 0813 07/08/23 0830 07/08/23 0900  BP: 118/67  118/72 118/63  Pulse: (!) 121 (!) 116  (!) 123 (!) 118  Resp: (!) 24 (!) 28 (!) 27 (!) 36  Temp: 100.3 F (37.9 C)     TempSrc: Axillary     SpO2: 91% (!) 89% 93% 91%  Weight:      Height:       Weight change:   Intake/Output Summary (Last 24 hours) at 07/08/2023 0908 Last data filed at 07/08/2023 0855 Gross per 24 hour  Intake 3129.11 ml  Output 2475 ml  Net 654.11 ml       Labs: RENAL PANEL Recent Labs  Lab 07/03/23 0505 07/04/23 0547 07/05/23 0750 07/06/23 0305 07/07/23 0339  NA 135 138 141 141 144  K 3.6 3.5 3.5 3.0* 2.9*  CL 112* 113* 114* 115* 117*  CO2 20* 22 21* 21* 22  GLUCOSE 104* 87 130* 137* 175*  BUN 34* 34* 40* 51* 63*  CREATININE 4.32* 4.10* 4.34* 4.64* 4.83*  CALCIUM  9.9 10.5* 9.3 8.6* 8.1*  MG 1.8 1.6* 1.4* 1.8 2.0  PHOS 3.8  --   --   --   --   ALBUMIN  <1.5* <1.5* <1.5* 1.5* <1.5*    Liver Function Tests: Recent Labs  Lab 07/05/23 0750 07/06/23 0305 07/07/23 0339  AST 30 27 13*  ALT 12 13 11   ALKPHOS 34* 30* 31*  BILITOT 0.7 0.7 0.5  PROT >12.0* >12.0* >12.0*  ALBUMIN  <1.5* 1.5* <1.5*   No results for input(s): "LIPASE", "AMYLASE" in the last 168 hours. Recent Labs  Lab 07/05/23 1716 07/07/23 0339  AMMONIA 81* 70*  CBC: Recent Labs    07/04/23 0547 07/05/23 0750 07/06/23 0305 07/07/23 0339 07/07/23 1107  HGB 7.5* 7.2* 7.0* 6.3* 6.9*  MCV 93.5 93.6 92.2 94.5  --     Cardiac Enzymes: No results for input(s): "CKTOTAL", "CKMB", "CKMBINDEX", "TROPONINI" in the last 168 hours. CBG: Recent Labs  Lab 07/07/23 1647 07/07/23 2125 07/08/23 0245 07/08/23 0555 07/08/23 0742  GLUCAP 179* 156* 152* 169* 167*    Iron Studies: No results for input(s): "IRON", "TIBC", "TRANSFERRIN", "FERRITIN" in the last 72 hours. Studies/Results: DG Abd 1 View Result Date: 07/07/2023 CLINICAL DATA:  Nasogastric tube placement. EXAM: ABDOMEN - 1 VIEW COMPARISON:  None Available. FINDINGS: Weighted enteric tube tip in the left upper abdomen in the region of the mid or distal  stomach. No bowel dilatation in the included upper abdomen. IMPRESSION: Weighted enteric tube tip in the mid or distal stomach. Electronically Signed   By: Chadwick Colonel M.D.   On: 07/07/2023 17:08   DG Fluoro Rm 1-60 Min - No Report Result Date: 07/07/2023 CLINICAL DATA:  Acute encephalopathy, nasogastric tube placement requested. EXAM: FLUORO RM 1-60 MIN-NO REPORT FLUOROSCOPY: Fluoroscopy Time:  0 minutes 48 seconds Radiation Exposure Index (if provided by the fluoroscopic device): 7.0 mGy Number of Acquired Spot Images: 1 COMPARISON:  None Available. FINDINGS: Multiple attempts at nasogastric tube placement with a 20 French catheter resulted in right or left mainstem bronchus positioning. Patient was unable to swallow or follow directions to aid with insertion. Study was terminated. IMPRESSION: Unsuccessful nasogastric tube placement under fluoroscopy. Electronically Signed   By: Shearon Denis M.D.   On: 07/07/2023 14:13   ECHOCARDIOGRAM COMPLETE Result Date: 07/07/2023    ECHOCARDIOGRAM REPORT   Patient Name:   Frank [name]. Date of Exam: 07/07/2023 Medical Rec #:  [id number]          Height:       74.0 in Accession #:    [id number]         Weight:       236.6 lb Date of Birth:  [birth year]          BSA:          2.334 m Patient Age:    58 years           BP:           141/88 mmHg Patient Gender: M                  HR:           110 bpm. Exam Location:  Inpatient Procedure: 3D Echo, Cardiac Doppler, Color Doppler and Intracardiac            Opacification Agent (Both Spectral and Color Flow Doppler were            utilized during procedure). Indications:    Atrial fibrillation  History:        Patient has no prior history of Echocardiogram examinations.  Sonographer:    Juanita Shaw Referring Phys: [id number] GEXBMW PARKER  Sonographer Comments: Image acquisition challenging due to patient body habitus and Image acquisition challenging due to respiratory motion. IMPRESSIONS  1. Left ventricular ejection  fraction, by estimation, is 65 to 70%. The left ventricle has normal function. The left ventricle has no regional wall motion abnormalities. Left ventricular diastolic parameters are indeterminate.  2. Right ventricular systolic function is normal. The right ventricular size is normal. There is normal pulmonary artery systolic pressure. The estimated right  ventricular systolic pressure is 35.8 mmHg.  3. Left atrial size was moderately dilated.  4. The mitral valve is normal in structure. No evidence of mitral valve regurgitation. No evidence of mitral stenosis.  5. The aortic valve is tricuspid. Aortic valve regurgitation is not visualized. Aortic valve sclerosis/calcification is present, without any evidence of aortic stenosis. Aortic valve mean gradient measures 8.0 mmHg.  6. Aortic dilatation noted. There is mild dilatation of the aortic root, measuring 39 mm.  7. The inferior vena cava is dilated in size with <50% respiratory variability, suggesting right atrial pressure of 15 mmHg.  8. The patient was in atrial fibrillation. FINDINGS  Left Ventricle: Left ventricular ejection fraction, by estimation, is 65 to 70%. The left ventricle has normal function. The left ventricle has no regional wall motion abnormalities. The left ventricular internal cavity size was normal in size. There is  no left ventricular hypertrophy. Left ventricular diastolic parameters are indeterminate. Right Ventricle: The right ventricular size is normal. No increase in right ventricular wall thickness. Right ventricular systolic function is normal. There is normal pulmonary artery systolic pressure. The tricuspid regurgitant velocity is 2.28 m/s, and  with an assumed right atrial pressure of 15 mmHg, the estimated right ventricular systolic pressure is 35.8 mmHg. Left Atrium: Left atrial size was moderately dilated. Right Atrium: Right atrial size was normal in size. Pericardium: There is no evidence of pericardial effusion. Mitral Valve:  The mitral valve is normal in structure. No evidence of mitral valve regurgitation. No evidence of mitral valve stenosis. MV peak gradient, 5.4 mmHg. The mean mitral valve gradient is 2.0 mmHg. Tricuspid Valve: The tricuspid valve is normal in structure. Tricuspid valve regurgitation is trivial. Aortic Valve: The aortic valve is tricuspid. Aortic valve regurgitation is not visualized. Aortic valve sclerosis/calcification is present, without any evidence of aortic stenosis. Aortic valve mean gradient measures 8.0 mmHg. Aortic valve peak gradient measures 16.0 mmHg. Aortic valve area, by VTI measures 2.25 cm. Pulmonic Valve: The pulmonic valve was normal in structure. Pulmonic valve regurgitation is not visualized. Aorta: Aortic dilatation noted. There is mild dilatation of the aortic root, measuring 39 mm. Venous: The inferior vena cava is dilated in size with less than 50% respiratory variability, suggesting right atrial pressure of 15 mmHg. IAS/Shunts: No atrial level shunt detected by color flow Doppler.  LEFT VENTRICLE PLAX 2D LVIDd:         5.00 cm      Diastology LVIDs:         3.60 cm      LV e' medial:    7.94 cm/s LV PW:         0.60 cm      LV E/e' medial:  17.0 LV IVS:        0.90 cm      LV e' lateral:   12.90 cm/s LVOT diam:     2.00 cm      LV E/e' lateral: 10.5 LV SV:         56 LV SV Index:   24 LVOT Area:     3.14 cm  LV Volumes (MOD) LV vol d, MOD A4C: 170.0 ml LV vol s, MOD A4C: 50.6 ml LV SV MOD A4C:     170.0 ml RIGHT VENTRICLE             IVC RV Basal diam:  4.20 cm     IVC diam: 2.50 cm RV Mid diam:    2.80 cm RV S prime:  22.40 cm/s TAPSE (M-mode): 2.0 cm LEFT ATRIUM              Index        RIGHT ATRIUM           Index LA diam:        4.20 cm  1.80 cm/m   RA Area:     17.80 cm LA Vol (A2C):   106.0 ml 45.41 ml/m  RA Volume:   43.80 ml  18.76 ml/m LA Vol (A4C):   86.7 ml  37.14 ml/m LA Biplane Vol: 101.0 ml 43.27 ml/m  AORTIC VALVE                     PULMONIC VALVE AV Area (Vmax):     2.04 cm      PV Vmax:       1.42 m/s AV Area (Vmean):   2.04 cm      PV Peak grad:  8.1 mmHg AV Area (VTI):     2.25 cm AV Vmax:           200.00 cm/s AV Vmean:          125.000 cm/s AV VTI:            0.248 m AV Peak Grad:      16.0 mmHg AV Mean Grad:      8.0 mmHg LVOT Vmax:         130.00 cm/s LVOT Vmean:        81.200 cm/s LVOT VTI:          0.178 m LVOT/AV VTI ratio: 0.72  AORTA Ao Root diam: 3.90 cm Ao Asc diam:  3.30 cm MITRAL VALVE                TRICUSPID VALVE MV Area (PHT): 5.70 cm     TR Peak grad:   20.8 mmHg MV Area VTI:   2.31 cm     TR Vmax:        228.00 cm/s MV Peak grad:  5.4 mmHg MV Mean grad:  2.0 mmHg     SHUNTS MV Vmax:       1.16 m/s     Systemic VTI:  0.18 m MV Vmean:      65.4 cm/s    Systemic Diam: 2.00 cm MV Decel Time: 133 msec MV E velocity: 135.00 cm/s Dalton McleanMD Electronically signed by Archer Bear Signature Date/Time: 07/07/2023/9:53:50 AM    Final    DG CHEST PORT 1 VIEW Result Date: 07/06/2023 CLINICAL DATA:  Weakness altered EXAM: PORTABLE CHEST 1 VIEW COMPARISON:  07/05/2023 FINDINGS: Hypoventilatory changes. Cardiomegaly with mild central congestion. No consolidation, pleural effusion, or pneumothorax IMPRESSION: Cardiomegaly with mild central congestion. Electronically Signed   By: Esmeralda Hedge M.D.   On: 07/06/2023 18:17    Medications: Infusions:  amiodarone  30 mg/hr (07/08/23 0855)   cefTRIAXone  (ROCEPHIN )  IV Stopped (07/08/23 0617)   diltiazem  (CARDIZEM ) infusion 15 mg/hr (07/08/23 0855)   fluconazole  (DIFLUCAN ) IV 200 mg (07/08/23 0849)    Scheduled Medications:  bortezomib  SQ  1.3 mg/m2 (Treatment Plan Recorded) Subcutaneous Once   cyclophosphamide   400 mg/m2 (Treatment Plan Recorded) Intravenous Once   famotidine   10.4 mg Per Tube Daily   feeding supplement  237 mL Oral BID BM   feeding supplement (VITAL HIGH PROTEIN)  1,000 mL Per Tube Q24H   insulin  aspart  0-9 Units Subcutaneous Q4H   lactulose   300 mL Rectal BID  nicotine   14  mg Transdermal Daily   mouth rinse  15 mL Mouth Rinse 4 times per day   palonosetron   0.25 mg Intravenous Once   [START ON 07/09/2023] pneumococcal 20-valent conjugate vaccine  0.5 mL Intramuscular Tomorrow-1000   valproic  acid  250 mg Oral Q6H    have reviewed scheduled and prn medications.  Physical Exam: General:NAD, comfortable Heart:RRR, s1s2 nl Lungs:clear b/l, no crackle Abdomen:soft, Non-tender, non-distended Extremities:No edema Neurology: Confused male.  Cornelio Parkerson Alverta Avers Jiah Bari 07/08/2023,9:08 AM  LOS: 5 days

## 2023-07-08 NOTE — Progress Notes (Signed)
 Upon AM assessment, patient's lungs sounded extremely Rhonchus with concern for aspiration overnight. Patient had experienced some nasal and oral bleeding per nightshift RN. This RN was able to suction out some bright red blood from the back of patient's throat. Dr Maury Space notified by RN of aspiration concern, and STAT CXR and labs ordered. TF held by this RN until CXR results available.   1 Unit of blood ready for transfusion per blood bank. Per Dr Maury Space, hold unit until AM CBC and CXR results come back.

## 2023-07-08 NOTE — Progress Notes (Signed)
 Radiology reported a large acute Hematoma on patient's CT scan to RN. R buttock/groin hematoma measures 36x 11.4x 9.7 cm. RN relayed results of CT scan to Dr Marygrace Snellen. See orders in [facility]. Heparin  gtt remains off.

## 2023-07-08 NOTE — Progress Notes (Signed)
 Initial Nutrition Assessment  DOCUMENTATION CODES:   Not applicable  INTERVENTION:  - Per CCM, can initiate tube feeds today. Vital 1.5 at 65 ml/h (1560 ml per day) *Start at 16mL/hr and advancing as tolerated by 10mL Q8H Prosource TF20 60 ml daily Provides 2420 kcal, 125 gm protein, 1192 ml free water  daily  - Monitor magnesium , potassium, and phosphorus BID for at least 3 days, MD to replete as needed, as pt may be at risk for refeeding syndrome.  - FWF per CCM/MD.   - Monitor weight trends.   NUTRITION DIAGNOSIS:   Inadequate oral intake related to inability to eat as evidenced by NPO status.  GOAL:   Patient will meet greater than or equal to 90% of their needs  MONITOR:   Vent status, Labs, Weight trends, TF tolerance  REASON FOR ASSESSMENT:   Consult Enteral/tube feeding initiation and management  ASSESSMENT:   58 y.o. male with PMH of obesity, asthma, OSA on CPAP, GERD, HTN, HLD who presented due to feeling weak with AMS for a few days. Admitted with concern for sepsis, community-acquired pneumonia, encephalopathy as well as concern for multiple myeloma.  4/17 Admit 4/18 Regular textured diet 4/20 Cortrak attempted but unable to be placed 4/21 SLP eval - NPO; IR attempted Cortrak but unable to be placed; CCM MD successfully placed NGT 4/22 Concern for aspiration overnight; Intubated  Patient is currently intubated on ventilator support MV: 12.3 L/min Temp (24hrs), Avg:100.3 F (37.9 C), Min:99.1 F (37.3 C), Max:100.9 F (38.3 C)  No visitors at bedside at time of visit.  Per chart review, no significant changes in weight PTA. However, patient initially weighed at 243# this admission but then weighed at 236# and then 227#. Question if patient has had significant weight loss recently, but difficult to accurately assess weight at this time.   Shortly after visit patient intubated by CCM team. NGT remains in place.  Per discussion with CCM, can initiate  tube feeds and begin advancing to goal today. Will plan to advance slowly due to possible risk of refeeding given no intake x5 days and possible significant weight loss.  Discussed plan with RN as well.    Medications reviewed and include: Lactulose  BID  Labs reviewed:  K+ 3.3 Creatinine 4.72 HA1C 6.7 (as of 01/29/23) Blood Glucose 152-179 x24 hours   NUTRITION - FOCUSED PHYSICAL EXAM:  Flowsheet Row Most Recent Value  Orbital Region Mild depletion  Upper Arm Region No depletion  Thoracic and Lumbar Region No depletion  Buccal Region Moderate depletion  Temple Region Mild depletion  Clavicle Bone Region No depletion  Clavicle and Acromion Bone Region No depletion  Scapular Bone Region Unable to assess  Dorsal Hand No depletion  Patellar Region Mild depletion  Anterior Thigh Region Mild depletion  Posterior Calf Region Moderate depletion  Edema (RD Assessment) None  Hair Reviewed  Eyes Unable to assess  Mouth Unable to assess  Skin Reviewed  Nails Reviewed       Diet Order:   Diet Order             Diet NPO time specified  Diet effective now                   EDUCATION NEEDS:  Not appropriate for education at this time  Skin:  Skin Assessment: Skin Integrity Issues: Skin Integrity Issues:: Unstageable Unstageable: Left and Right Heel  Last BM:  4/22 - rectal tube  Height:  Ht Readings from Last 1  Encounters:  07/08/23 6\' 2"  (1.88 m)   Weight:  Wt Readings from Last 1 Encounters:  07/08/23 103.3 kg   Ideal Body Weight:  86.36 kg  BMI:  Body mass index is 29.24 kg/m.  Estimated Nutritional Needs:  Kcal:  2350-2550 kcals Protein:  105-130 grams Fluid:  >/= 2.3L    Scheryl Cushing RD, LDN Contact via Secure Chat.

## 2023-07-08 NOTE — Procedures (Signed)
 Central Venous Catheter Insertion Procedure Note  Frank [name]  [id number]  02-24-1966  Date:07/08/23  Time:12:10 PM   Provider Performing:Kahliya Fraleigh E Kiandra Sanguinetti   Procedure: Insertion of Non-tunneled Central Venous [phone]) with US  guidance (69629)   Indication(s) Medication administration and Difficult access  Consent Risks of the procedure as well as the alternatives and risks of each were explained to the patient and/or caregiver.  Consent for the procedure was obtained and is signed in the bedside chart  Anesthesia Topical only with 1% lidocaine    Timeout Verified patient identification, verified procedure, site/side was marked, verified correct patient position, special equipment/implants available, medications/allergies/relevant history reviewed, required imaging and test results available.  Sterile Technique Maximal sterile technique including full sterile barrier drape, hand hygiene, sterile gown, sterile gloves, mask, hair covering, sterile ultrasound probe cover (if used).  Procedure Description Area of catheter insertion was cleaned with IODINE  and draped in sterile fashion.  With real-time ultrasound guidance a central venous catheter was placed into the left femoral vein. Positioning of guidewire within left femoral vein was verified by US  prior to dilation of vessel and placement of central line. Nonpulsatile blood flow and easy flushing noted in all ports.  The catheter was sutured in place and sterile dressing applied.  Complications/Tolerance None; patient tolerated the procedure well.  Chest x-ray is not ordered for femoral cannulation.  EBL Minimal  Specimen(s) None    Eston Hence MSN, AGACNP-BC [facility] Pulmonary/Critical Care Medicine Amion for pager 07/08/2023, 12:11 PM

## 2023-07-08 NOTE — Discharge Instructions (Addendum)
 Information on my medicine - ELIQUIS (apixaban)  This medication education was reviewed with me or my healthcare representative as part of my discharge preparation.    Why was Eliquis prescribed for you? Eliquis was prescribed for you to reduce the risk of forming blood clots that can cause a stroke if you have a medical condition called atrial fibrillation (a type of irregular heartbeat) OR to reduce the risk of a blood clots forming after orthopedic surgery.  What do You need to know about Eliquis ? Take your Eliquis TWICE DAILY - one tablet in the morning and one tablet in the evening with or without food.  It would be best to take the doses about the same time each day.  If you have difficulty swallowing the tablet whole please discuss with your pharmacist how to take the medication safely.  Take Eliquis exactly as prescribed by your doctor and DO NOT stop taking Eliquis without talking to the doctor who prescribed the medication.  Stopping may increase your risk of developing a new clot or stroke.  Refill your prescription before you run out.  After discharge, you should have regular check-up appointments with your healthcare provider that is prescribing your Eliquis.  In the future your dose may need to be changed if your kidney function or weight changes by a significant amount or as you get older.  What do you do if you miss a dose? If you miss a dose, take it as soon as you remember on the same day and resume taking twice daily.  Do not take more than one dose of ELIQUIS at the same time.  Important Safety Information A possible side effect of Eliquis is bleeding. You should call your healthcare provider right away if you experience any of the following: ? Bleeding from an injury or your nose that does not stop. ? Unusual colored urine (red or dark brown) or unusual colored stools (red or black). ? Unusual bruising for unknown reasons. ? A serious fall or if you hit your  head (even if there is no bleeding).  Some medicines may interact with Eliquis and might increase your risk of bleeding or clotting while on Eliquis. To help avoid this, consult your healthcare provider or pharmacist prior to using any new prescription or non-prescription medications, including herbals, vitamins, non-steroidal anti-inflammatory drugs (NSAIDs) and supplements.  This website has more information on Eliquis (apixaban): [email or web address].com.cy.

## 2023-07-08 NOTE — Progress Notes (Signed)
 Overall, really has been no change in status for Mr. Barretto.  He is on a amiodarone  drip.  Cardizem  drip.  Heparin  infusion.  He gets Haldol .  He has a lot of agitation confusion.  We still do not have back the serum viscosity.  It is hard to believe that it takes 5 days for this result to come back.  He really, really, really, really muscular in chemotherapy today.  We really need to start to decrease his protein load.  I think he has a feeding tube in now.  I think he is getting some feeding.  There are no labs back yet today.  I think he has been having some temperatures.  This morning, temperature of 100.2.  Blood pressure 147/80.  Pulse is 118.  Looks like he may be in sinus tachycardia right now.  Yesterday, his calcium  was down to 8.1 so that certainly has gotten a lot better.  His potassium was incredibly low 2.9.  I think he got potassium replacement.  Hopefully, we will be able to treat him today.  This should be a relatively simple treatment with very little myelosuppression.  I think the treatment should be able to work fairly quickly and start getting his protein level down.  Of note, his beta-2 microglobulin was 22 last week.  This is incredibly high.  Some of this has to do with his renal insufficiency however.  I know that he is getting great care down in the ICU.  I imagine that he will probably will stay there for a while until everything stabilizes.    [name], MD  [name] 1:37

## 2023-07-09 ENCOUNTER — Inpatient Hospital Stay (HOSPITAL_COMMUNITY)

## 2023-07-09 DIAGNOSIS — N179 Acute kidney failure, unspecified: Secondary | ICD-10-CM | POA: Diagnosis not present

## 2023-07-09 DIAGNOSIS — J9601 Acute respiratory failure with hypoxia: Secondary | ICD-10-CM | POA: Diagnosis not present

## 2023-07-09 DIAGNOSIS — I480 Paroxysmal atrial fibrillation: Secondary | ICD-10-CM | POA: Diagnosis not present

## 2023-07-09 DIAGNOSIS — G9340 Encephalopathy, unspecified: Secondary | ICD-10-CM | POA: Diagnosis not present

## 2023-07-09 DIAGNOSIS — R531 Weakness: Secondary | ICD-10-CM | POA: Diagnosis not present

## 2023-07-09 DIAGNOSIS — C9000 Multiple myeloma not having achieved remission: Secondary | ICD-10-CM | POA: Diagnosis not present

## 2023-07-09 LAB — RENAL FUNCTION PANEL
Albumin: <1.5 g/dL — ABNORMAL LOW (ref 3.5–5.0)
Anion gap: 6 (ref 5–15)
BUN: 89 mg/dL — ABNORMAL HIGH (ref 6–20)
CO2: 22 mmol/L (ref 22–32)
Calcium: 7 mg/dL — ABNORMAL LOW (ref 8.9–10.3)
Chloride: 112 mmol/L — ABNORMAL HIGH (ref 98–111)
Creatinine, Ser: 5 mg/dL — ABNORMAL HIGH (ref 0.61–1.24)
GFR, Estimated: 13 mL/min — ABNORMAL LOW (ref >60)
Glucose, Bld: 299 mg/dL — ABNORMAL HIGH (ref 70–99)
Phosphorus: 2.9 mg/dL (ref 2.5–4.6)
Potassium: 3.9 mmol/L (ref 3.5–5.1)
Sodium: 140 mmol/L (ref 135–145)

## 2023-07-09 LAB — CBC WITH DIFFERENTIAL/PLATELET
Abs Immature Granulocytes: 0.2 10*3/uL — ABNORMAL HIGH (ref 0.00–0.07)
Abs Immature Granulocytes: 0.56 10*3/uL — ABNORMAL HIGH (ref 0.00–0.07)
Basophils Absolute: 0 10*3/uL (ref 0.0–0.1)
Basophils Absolute: 0 10*3/uL (ref 0.0–0.1)
Basophils Relative: 0 %
Basophils Relative: 0 %
Eosinophils Absolute: 0 10*3/uL (ref 0.0–0.5)
Eosinophils Absolute: 0 10*3/uL (ref 0.0–0.5)
Eosinophils Relative: 0 %
Eosinophils Relative: 0 %
HCT: 23.5 % — ABNORMAL LOW (ref 39.0–52.0)
HCT: 23.7 % — ABNORMAL LOW (ref 39.0–52.0)
Hemoglobin: 7.4 g/dL — ABNORMAL LOW (ref 13.0–17.0)
Hemoglobin: 7.3 g/dL — ABNORMAL LOW (ref 13.0–17.0)
Immature Granulocytes: 5 %
Lymphocytes Relative: 16 %
Lymphocytes Relative: 25 %
Lymphs Abs: 2.8 10*3/uL (ref 0.7–4.0)
Lymphs Abs: 3.1 10*3/uL (ref 0.7–4.0)
MCH: 28.6 pg (ref 26.0–34.0)
MCH: 28.1 pg (ref 26.0–34.0)
MCHC: 31.5 g/dL (ref 30.0–36.0)
MCHC: 30.8 g/dL (ref 30.0–36.0)
MCV: 90.7 fL (ref 80.0–100.0)
MCV: 91.2 fL (ref 80.0–100.0)
Metamyelocytes Relative: 1 %
Monocytes Absolute: 0.5 10*3/uL (ref 0.1–1.0)
Monocytes Absolute: 0.6 10*3/uL (ref 0.1–1.0)
Monocytes Relative: 3 %
Monocytes Relative: 5 %
Neutro Abs: 13.5 10*3/uL — ABNORMAL HIGH (ref 1.7–7.7)
Neutro Abs: 8 10*3/uL — ABNORMAL HIGH (ref 1.7–7.7)
Neutrophils Relative %: 78 %
Neutrophils Relative %: 65 %
Other: 2 %
Platelets: 108 10*3/uL — ABNORMAL LOW (ref 150–400)
Platelets: 100 10*3/uL — ABNORMAL LOW (ref 150–400)
RBC: 2.59 MIL/uL — ABNORMAL LOW (ref 4.22–5.81)
RBC: 2.6 MIL/uL — ABNORMAL LOW (ref 4.22–5.81)
RDW: 20.7 % — ABNORMAL HIGH (ref 11.5–15.5)
RDW: 21.1 % — ABNORMAL HIGH (ref 11.5–15.5)
WBC: 17.3 10*3/uL — ABNORMAL HIGH (ref 4.0–10.5)
WBC: 12.4 10*3/uL — ABNORMAL HIGH (ref 4.0–10.5)
nRBC: 4.7 % — ABNORMAL HIGH (ref 0.0–0.2)
nRBC: 7 /100{WBCs} — ABNORMAL HIGH
nRBC: 3.2 % — ABNORMAL HIGH (ref 0.0–0.2)

## 2023-07-09 LAB — TYPE AND SCREEN
ABO/RH(D): A POS
Antibody Screen: NEGATIVE
Unit division: 0
Unit division: 0
Unit division: 0
Unit division: 0
Unit division: 0

## 2023-07-09 LAB — BPAM RBC
Blood Product Expiration Date: 202505162359
Blood Product Expiration Date: 202505202359
Blood Product Expiration Date: 202505202359
Blood Product Expiration Date: 202505212359
Blood Product Expiration Date: 202505212359
ISSUE DATE / TIME: 202504210634
ISSUE DATE / TIME: 202504221043
ISSUE DATE / TIME: 202504221330
ISSUE DATE / TIME: 202504221947
ISSUE DATE / TIME: 202504222320
Unit Type and Rh: 202505202359
Unit Type and Rh: 6200
Unit Type and Rh: 6200
Unit Type and Rh: 6200
Unit Type and Rh: 6200
Unit Type and Rh: 6200

## 2023-07-09 LAB — PHOSPHORUS
Phosphorus: 3.4 mg/dL (ref 2.5–4.6)
Phosphorus: 2.9 mg/dL (ref 2.5–4.6)

## 2023-07-09 LAB — COMPREHENSIVE METABOLIC PANEL WITH GFR
ALT: 25 U/L (ref 0–44)
AST: 27 U/L (ref 15–41)
Albumin: <1.5 g/dL — ABNORMAL LOW (ref 3.5–5.0)
Alkaline Phosphatase: 43 U/L (ref 38–126)
Anion gap: 8 (ref 5–15)
BUN: 97 mg/dL — ABNORMAL HIGH (ref 6–20)
CO2: 20 mmol/L — ABNORMAL LOW (ref 22–32)
Calcium: 7.4 mg/dL — ABNORMAL LOW (ref 8.9–10.3)
Chloride: 118 mmol/L — ABNORMAL HIGH (ref 98–111)
Creatinine, Ser: 6.06 mg/dL — ABNORMAL HIGH (ref 0.61–1.24)
GFR, Estimated: 10 mL/min — ABNORMAL LOW (ref >60)
Glucose, Bld: 281 mg/dL — ABNORMAL HIGH (ref 70–99)
Potassium: 4.6 mmol/L (ref 3.5–5.1)
Sodium: 146 mmol/L — ABNORMAL HIGH (ref 135–145)
Total Bilirubin: 0.6 mg/dL (ref 0.0–1.2)
Total Protein: 10.4 g/dL — ABNORMAL HIGH (ref 6.5–8.1)

## 2023-07-09 LAB — GLUCOSE, CAPILLARY
Glucose-Capillary: 232 mg/dL — ABNORMAL HIGH (ref 70–99)
Glucose-Capillary: 248 mg/dL — ABNORMAL HIGH (ref 70–99)
Glucose-Capillary: 215 mg/dL — ABNORMAL HIGH (ref 70–99)
Glucose-Capillary: 252 mg/dL — ABNORMAL HIGH (ref 70–99)
Glucose-Capillary: 262 mg/dL — ABNORMAL HIGH (ref 70–99)
Glucose-Capillary: 223 mg/dL — ABNORMAL HIGH (ref 70–99)

## 2023-07-09 LAB — POTASSIUM: Potassium: 3.9 mmol/L (ref 3.5–5.1)

## 2023-07-09 LAB — URIC ACID: Uric Acid, Serum: >21 mg/dL — ABNORMAL HIGH (ref 3.7–8.6)

## 2023-07-09 LAB — VISCOSITY, SERUM: Viscosity, Serum: 3.6 rel.saline — ABNORMAL HIGH (ref 1.4–2.1)

## 2023-07-09 LAB — MAGNESIUM
Magnesium: 2.2 mg/dL (ref 1.7–2.4)
Magnesium: 2.2 mg/dL (ref 1.7–2.4)

## 2023-07-09 MED ORDER — ORAL CARE MOUTH RINSE
15.0000 mL | OROMUCOSAL | Status: DC
Start: 2023-07-09 — End: 2023-08-12
  Administered 2023-07-09 – 2023-08-12 (×380): 15 mL via OROMUCOSAL

## 2023-07-09 MED ORDER — MIDAZOLAM HCL 2 MG/2ML IJ SOLN
INTRAMUSCULAR | Status: AC
  Administered 2023-07-09: 2 mg via INTRAVENOUS
  Filled 2023-07-09: qty 4

## 2023-07-09 MED ORDER — PRISMASOL BGK 4/2.5 32-4-2.5 MEQ/L EC SOLN: Status: DC

## 2023-07-09 MED ORDER — PIPERACILLIN-TAZOBACTAM 3.375 G IVPB
3.3750 g | Freq: Three times a day (TID) | INTRAVENOUS | Status: DC
  Administered 2023-07-09 – 2023-07-17 (×24): 3.375 g via INTRAVENOUS
  Filled 2023-07-09 (×25): qty 50

## 2023-07-09 MED ORDER — HEPARIN SODIUM (PORCINE) 1000 UNIT/ML DIALYSIS
1000.0000 [IU] | INTRAMUSCULAR | Status: DC | PRN
  Administered 2023-07-18: 3000 [IU] via INTRAVENOUS_CENTRAL
  Administered 2023-07-20 – 2023-07-26 (×2): 2400 [IU] via INTRAVENOUS_CENTRAL
  Filled 2023-07-09: qty 5
  Filled 2023-07-09: qty 6
  Filled 2023-07-09 (×2): qty 3

## 2023-07-09 MED ORDER — NOREPINEPHRINE 16 MG/250ML-% IV SOLN
0.0000 ug/min | INTRAVENOUS | Status: DC
  Administered 2023-07-09: 7 ug/min via INTRAVENOUS
  Administered 2023-07-10 – 2023-07-11 (×2): 13 ug/min via INTRAVENOUS
  Administered 2023-07-12: 17 ug/min via INTRAVENOUS
  Administered 2023-07-13: 9 ug/min via INTRAVENOUS
  Administered 2023-07-15: 16 ug/min via INTRAVENOUS
  Administered 2023-07-16: 10 ug/min via INTRAVENOUS
  Administered 2023-07-17: 9 ug/min via INTRAVENOUS
  Administered 2023-07-18: 10 ug/min via INTRAVENOUS
  Administered 2023-07-19 – 2023-07-20 (×2): 14 ug/min via INTRAVENOUS
  Administered 2023-07-21: 7 ug/min via INTRAVENOUS
  Administered 2023-07-22: 9 ug/min via INTRAVENOUS
  Administered 2023-07-24: 8 ug/min via INTRAVENOUS
  Filled 2023-07-09 (×14): qty 250

## 2023-07-09 MED ORDER — DEXMEDETOMIDINE HCL IN NACL 200 MCG/50ML IV SOLN
0.0000 ug/kg/h | INTRAVENOUS | Status: DC
  Administered 2023-07-09: 0.08 ug/kg/h via INTRAVENOUS
  Administered 2023-07-09: 0.2 ug/kg/h via INTRAVENOUS
  Filled 2023-07-09: qty 50

## 2023-07-09 MED ORDER — CALCIUM GLUCONATE-NACL 1-0.675 GM/50ML-% IV SOLN
1.0000 g | Freq: Once | INTRAVENOUS | Status: AC
  Administered 2023-07-09: 1000 mg via INTRAVENOUS
  Filled 2023-07-09: qty 50

## 2023-07-09 MED ORDER — VITAL 1.5 CAL PO LIQD
1000.0000 mL | ORAL | Status: DC
  Administered 2023-07-09 – 2023-07-27 (×20): 1000 mL
  Filled 2023-07-09 (×28): qty 1000

## 2023-07-09 MED ORDER — PROSOURCE TF20 ENFIT COMPATIBL EN LIQD
60.0000 mL | Freq: Three times a day (TID) | ENTERAL | Status: DC
  Administered 2023-07-09 – 2023-07-27 (×52): 60 mL
  Filled 2023-07-09 (×52): qty 60

## 2023-07-09 MED ORDER — PIPERACILLIN-TAZOBACTAM 3.375 G IVPB: 3.3750 g | Freq: Two times a day (BID) | INTRAVENOUS | Status: DC

## 2023-07-09 MED ORDER — SODIUM CHLORIDE 0.9 % IV SOLN
500.0000 [IU]/h | INTRAVENOUS | Status: DC
  Administered 2023-07-09 – 2023-07-10 (×2): 500 [IU]/h via INTRAVENOUS_CENTRAL
  Administered 2023-07-11: 750 [IU]/h via INTRAVENOUS_CENTRAL
  Administered 2023-07-11: 500 [IU]/h via INTRAVENOUS_CENTRAL
  Administered 2023-07-12 – 2023-07-14 (×6): 750 [IU]/h via INTRAVENOUS_CENTRAL
  Administered 2023-07-15 – 2023-07-18 (×8): 1000 [IU]/h via INTRAVENOUS_CENTRAL
  Administered 2023-07-19: 500 [IU]/h via INTRAVENOUS_CENTRAL
  Administered 2023-07-19 – 2023-07-22 (×5): 750 [IU]/h via INTRAVENOUS_CENTRAL
  Administered 2023-07-22 – 2023-07-26 (×10): 1000 [IU]/h via INTRAVENOUS_CENTRAL
  Filled 2023-07-09 (×2): qty 2
  Filled 2023-07-09 (×4): qty 10000
  Filled 2023-07-09: qty 2
  Filled 2023-07-09 (×18): qty 10000
  Filled 2023-07-09: qty 2
  Filled 2023-07-09: qty 10000
  Filled 2023-07-09: qty 2
  Filled 2023-07-09 (×7): qty 10000
  Filled 2023-07-09: qty 2
  Filled 2023-07-09 (×2): qty 10000

## 2023-07-09 MED ORDER — ORAL CARE MOUTH RINSE: 15.0000 mL | OROMUCOSAL | Status: DC | PRN

## 2023-07-09 MED ORDER — SODIUM CHLORIDE 0.9 % IV SOLN
6.0000 mg | Freq: Once | INTRAVENOUS | Status: AC
Start: 2023-07-09 — End: 2023-07-09
  Administered 2023-07-09: 6 mg via INTRAVENOUS
  Filled 2023-07-09: qty 4

## 2023-07-09 MED ORDER — FENTANYL 2500MCG IN NS 250ML (10MCG/ML) PREMIX INFUSION
0.0000 ug/h | INTRAVENOUS | Status: DC
  Administered 2023-07-09: 25 ug/h via INTRAVENOUS
  Administered 2023-07-09: 125 ug/h via INTRAVENOUS
  Administered 2023-07-09: 200 ug/h via INTRAVENOUS
  Administered 2023-07-10 – 2023-07-11 (×2): 150 ug/h via INTRAVENOUS
  Administered 2023-07-11: 175 ug/h via INTRAVENOUS
  Administered 2023-07-12 – 2023-07-13 (×2): 150 ug/h via INTRAVENOUS
  Administered 2023-07-13: 175 ug/h via INTRAVENOUS
  Administered 2023-07-14: 125 ug/h via INTRAVENOUS
  Administered 2023-07-14: 200 ug/h via INTRAVENOUS
  Administered 2023-07-14: 150 ug/h via INTRAVENOUS
  Filled 2023-07-09 (×11): qty 250

## 2023-07-09 MED ORDER — INSULIN ASPART 100 UNIT/ML IJ SOLN
0.0000 [IU] | INTRAMUSCULAR | Status: DC
  Administered 2023-07-09: 5 [IU] via SUBCUTANEOUS
  Administered 2023-07-09 (×2): 8 [IU] via SUBCUTANEOUS
  Administered 2023-07-09 – 2023-07-10 (×2): 5 [IU] via SUBCUTANEOUS
  Administered 2023-07-10 (×3): 8 [IU] via SUBCUTANEOUS
  Administered 2023-07-10: 5 [IU] via SUBCUTANEOUS
  Administered 2023-07-11 (×3): 8 [IU] via SUBCUTANEOUS

## 2023-07-09 MED ORDER — MIDAZOLAM HCL 2 MG/2ML IJ SOLN: 2.0000 mg | Freq: Once | INTRAMUSCULAR | Status: AC

## 2023-07-09 NOTE — Progress Notes (Signed)
 Nutrition Follow-up  DOCUMENTATION CODES:   Not applicable  INTERVENTION:  - New TF regimen as below.  Vital 1.5 at 60 ml/h (1440 ml per day) *Increase to 15mL/hr this evening then continue advancing as tolerated by 10mL Q12H Prosource TF20 60 ml TID Provides 2400 kcal, 157 gm protein, 1100 ml free water  daily   - Monitor magnesium , potassium, and phosphorus BID for at least 3 days, MD to replete as needed, as pt may be at risk for refeeding syndrome.   - FWF per CCM/MD.    - Monitor weight trends.   NUTRITION DIAGNOSIS:   Inadequate oral intake related to inability to eat as evidenced by NPO status. *ongoing  GOAL:   Patient will meet greater than or equal to 90% of their needs *progressing, on TF  MONITOR:   Vent status, Labs, Weight trends, TF tolerance  REASON FOR ASSESSMENT:   Consult Assessment of nutrition requirement/status  ASSESSMENT:   58 y.o. male with PMH of obesity, asthma, OSA on CPAP, GERD, HTN, HLD who presented due to feeling weak with AMS for a few days. Admitted with concern for sepsis, community-acquired pneumonia, encephalopathy as well as concern for multiple myeloma.  4/17 Admit 4/18 Regular textured diet 4/20 Cortrak attempted but unable to be placed 4/21 SLP eval - NPO; IR attempted Cortrak but unable to be placed; CCM MD successfully placed NGT; TF started 4/22 Concern for aspiration overnight and TF stopped; Intubated; TF restarted in the evening 4/23 CRRT initiated   Patient remains intubated on ventilator support MV: 12.6 L/min Temp (24hrs), Avg:100.8 F (38.2 C), Min:98.2 F (36.8 C), Max:102.5 F (39.2 C)  No visitors at bedside. TF infusing at 22mL/hr at time of visit this AM.   CRRT noted to be initiated this AM. Will adjust estimated needs for higher protein and change to Pivot to meet increased needs. Discussed change with CCM PA-C. Will plan to increase patient to 72mL/hr this evening as per the 12 hour advancements and  then continue increasing by 10mL Q12H. Discussed with RN.   Admit weight: 243# Current weight: 239# I&O's: +2.6L  Medications reviewed and include: Lactulose  TID Precedex  Fentanyl  Levo @ 9 mcg/min Vasopressin  @ 0.03 units/min   Labs reviewed:  Na 146 Creatinine 6.06 HA1C 6.7 (as of 01/29/23) Blood Glucose 167-248 x24 hours     Diet Order:   Diet Order             Diet NPO time specified  Diet effective now                   EDUCATION NEEDS:  Not appropriate for education at this time  Skin:  Skin Assessment: Skin Integrity Issues: Skin Integrity Issues:: Unstageable Unstageable: Left and Right Heel  Last BM:  4/22 - rectal tube  Height:  Ht Readings from Last 1 Encounters:  07/08/23 6\' 2"  (1.88 m)   Weight:  Wt Readings from Last 1 Encounters:  07/09/23 108.5 kg   Ideal Body Weight:  86.36 kg  BMI:  Body mass index is 30.71 kg/m.  Estimated Nutritional Needs:  Kcal:  2350-2550 kcals Protein:  145-175 grams Fluid:  >/= 2.3L    Scheryl Cushing RD, LDN Contact via Secure Chat.

## 2023-07-09 NOTE — Procedures (Signed)
 Central Venous Catheter Insertion Procedure Note  Frank [name]  [id number]  1965-06-01  Date:07/09/23  Time:10:01 AM   Provider Performing:Mckenze Slone D Marlys Singh   Procedure: Insertion of Non-tunneled Central Venous Catheter(36556)with US  guidance (40981)    Indication(s) Hemodialysis  Consent Risks of the procedure as well as the alternatives and risks of each were explained to the patient and/or caregiver.  Consent for the procedure was obtained and is signed in the bedside chart  Anesthesia Topical only with 1% lidocaine    Timeout Verified patient identification, verified procedure, site/side was marked, verified correct patient position, special equipment/implants available, medications/allergies/relevant history reviewed, required imaging and test results available.  Sterile Technique Maximal sterile technique including full sterile barrier drape, hand hygiene, sterile gown, sterile gloves, mask, hair covering, sterile ultrasound probe cover (if used).  Procedure Description Area of catheter insertion was cleaned with chlorhexidine  and draped in sterile fashion.   With real-time ultrasound guidance a HD catheter was placed into the right internal jugular vein.  Nonpulsatile blood flow and easy flushing noted in all ports.  The catheter was sutured in place and sterile dressing applied.  Complications/Tolerance None; patient tolerated the procedure well. Chest X-ray is ordered to verify placement for internal jugular or subclavian cannulation.  Chest x-ray is not ordered for femoral cannulation.  EBL Minimal  Specimen(s) None  JD Carliss Chess [facility] 07/09/2023, 10:01 AM  Please see [email or web address] for pager details.  From 7A-7P if no response, please call [phone]. After hours, please call ELink [phone].

## 2023-07-09 NOTE — Progress Notes (Signed)
 NAME:  Frank Cleary., MRN:  [id number], DOB:  1965/05/26, LOS: 6 ADMISSION DATE:  07/03/2023, CONSULTATION DATE:  07/06/23 REFERRING MD:  Dr Maury Space, CHIEF COMPLAINT:  Encephalopathy   History of Present Illness:  Asked to see patient for encephalopathy   Transferred from [facility] with 1 week of generalized weakness, altered mentation, decreased appetite Recently being worked up for multiple myeloma.  Transferred to [facility] for oncology evaluation Nephrology consulted for AKI   Background history of obesity, asthma, obstructive sleep apnea on CPAP GERD, hypertension, hyperlipidemia Has been feeling weak for the last few days according to spouse Admitted with concern for sepsis, community-acquired pneumonia, antibiotic encephalopathy Workup revealed lytic bony lesions with concern for multiple myeloma Transfused with 3 units packed red blood cells Evaluation so far-bone marrow 07/04/2023  Pertinent  Medical History   Past Medical History:  Diagnosis Date   Anxiety    Arthritis    Asthma    Bipolar disorder (HCC)    Current every day smoker    Depression    Diabetes mellitus, type II (HCC)    Dyspnea    History of kidney stones    History of pneumonia    Hyperlipidemia    Hypertension    Morbid obesity (HCC)    Sedentary lifestyle    Seizures (HCC)    last seizure 12 yrs ago, no current problem   Sleep apnea    uses CPAP nightly   Significant Hospital Events: Including procedures, antibiotic start and stop dates in addition to other pertinent events   4/19-blood cultures, MRSA PCR negative 4/19 chest x-ray with mild pulmonary congestion 4/20 atrial fibrillation with RVR 4/21 nosebleed after IR NG attempt, NG placed by PCCM in afternoon, stable transferred to [facility] 4/22 intubated; some abla requiring prbcs; ct abd/pelvis showing acute groin hematoma; heparin  held  Interim History / Subjective:  Intubated yesterday for lack of protection of airway Some abla yesterday  requiring prbcs; ct abd/pelvis showing acute groin hematoma; heparin  held  Today worsening AKI; nephro wanting to start HD Tmax 102 Uric acid >21 Remains encephalopathic and intubated on vent On levo/vaso  Objective   Blood pressure (!) 98/59, pulse 80, temperature (!) 102.1 F (38.9 C), temperature source Axillary, resp. rate 17, height 6\' 2"  (1.88 m), weight 108.5 kg, SpO2 98%.    Vent Mode: PRVC FiO2 (%):  [30 %-100 %] 30 % Set Rate:  [20 bmp] 20 bmp Vt Set:  [650 mL] 650 mL PEEP:  [5 cmH20-8 cmH20] 5 cmH20 Plateau Pressure:  [16 cmH20-22 cmH20] 16 cmH20   Intake/Output Summary (Last 24 hours) at 07/09/2023 0834 Last data filed at 07/09/2023 6962 Gross per 24 hour  Intake 4724.21 ml  Output 1360 ml  Net 3364.21 ml   Filed Weights   07/05/23 1132 07/08/23 0500 07/09/23 0500  Weight: 107.3 kg 103.3 kg 108.5 kg    Examination: General:  critically ill appearing on mech vent HEENT: MM pink/moist; ETT in place Neuro: on low sedation; not following commands; cough/gag present; perrl CV: s1s2, no m/r/g PULM:  dim clear BS bilaterally; on mech vent PRVC GI: soft, bsx4 active  Extremities: warm/dry, no edema; right groin increased size and firm on palpation Skin: no rashes or lesions    Resolved Hospital Problem list   Hypercalcemia-resolved  Assessment & Plan:  Acute hypoxemic respiratory failure: Most likely in the setting of encephalopathy and aspiration of oropharyngeal secretions with appearance of bloody frothy secretions through trachea during intubation.  Also  possible combination of volume overload in setting of renal failure and blood products, increased fluid load from chemotherapy etc. Possible aspiration Plan: -LTVV strategy with tidal volumes of 6-8 cc/kg ideal body weight -Wean PEEP/FiO2 for SpO2 >92% -VAP bundle in place -Daily SAT and SBT -PAD protocol in place -wean sedation for RASS goal 0 to -1 -cont zosyn  -send trach aspirate  Shock: septic +/-  hemorrhagic; also sedation related Plan: -cont levo/vaso for map goal >65 -follow cultures -cont zosyn   Multiple myeloma - Presented with encephalopathy, AKI, hypocalcemia, lytic bone lesions, Elevated immunoglobulins - Bone marrow results consistent with myeloma Plan: - Appreciate hematology/oncology assistance, started chemo 4/22 - elevated uric acid; start rasburicase ; trend uric acid  ABLA from right groin hematoma Plan: -heparin  held 4/22 -trend cbc; transfuse for hgb <7 or hemodynamically stable  Paroxysmal atrial fibrillation with RVR: converted to ST 4/22 AM Plan: -tele monitoring -cont amio -heparin  on hold -cards following; appreciate recs  AKI: Creatinine, BUN slowly rising but urine output appears to be picking up which is encouraging Plan: -nephro following; plan on placing hd cath and starting crrt -Trend BMP / urinary output -Replace electrolytes as indicated -Avoid nephrotoxic agents, ensure adequate renal perfusion  Fever: Related to pneumonia, possible myeloma. Plan: -abx as above -follow cultures -prn tylenol   Acute metabolic encephalopathy - Multifactorial, elevated ammonia, uremia, sepsis, hypercalcemia, myeloma Plan: -limit sedating meds -cont lactulose ; trend ammonia -CRRT as above  History of obstructive sleep apnea Plan: -will need cpap at bedtime when extubated  History of seizure disorder Plan: - Continue seizure precautions - Continue Depakote  per tube  Hypoalbuminemia Plan: -trend -TF per RD  Type 2 diabetes Plan: - increase SSI - cbg monitoring  Depression/anxiety Plan: -hold buspar , paxil , klonopin; hold wellbutrin (with hx of seizure disorder should likely not be restarted)  Peripheral neuropathy Plan: -hold gabapentin    Best Practice (right click and "Reselect all SmartList Selections" daily)   Diet/type: tubefeeds DVT prophylaxis SCD Pressure ulcer(s): N/A GI prophylaxis: H2B Lines: Central line Foley:   Yes, and it is still needed Code Status:  full code Last date of multidisciplinary goals of care discussion [wife updated conformed full code 4/22]  Labs   CBC: Recent Labs  Lab 07/05/23 0750 07/06/23 0305 07/07/23 0339 07/07/23 1107 07/07/23 2030 07/08/23 0828 07/08/23 1800 07/09/23 0504  WBC 8.6 10.1 13.7*  --   --  15.7*  --  17.3*  NEUTROABS 4.6 5.5 4.8  --   --  7.9*  --  13.5*  HGB 7.2* 7.0* 6.3* 6.9*  --  5.6* 6.2* 7.4*  HCT 24.9* 24.9* 22.2* 23.0*  --  19.5* 21.4* 23.5*  MCV 93.6 92.2 94.5  --   --  97.5  --  90.7  PLT 148* 150 140*  --  137* 147*  --  108*    Basic Metabolic Panel: Recent Labs  Lab 07/03/23 0505 07/04/23 0547 07/05/23 0750 07/06/23 0305 07/07/23 0339 07/08/23 0828 07/08/23 1800 07/09/23 0504  NA 135   < > 141 141 144 145  --  146*  K 3.6   < > 3.5 3.0* 2.9* 3.3*  --  4.6  CL 112*   < > 114* 115* 117* 119*  --  118*  CO2 20*   < > 21* 21* 22 20*  --  20*  GLUCOSE 104*   < > 130* 137* 175* 182*  --  281*  BUN 34*   < > 40* 51* 63* 77*  --  97*  CREATININE 4.32*   < > 4.34* 4.64* 4.83* 4.72*  --  6.06*  CALCIUM  9.9   < > 9.3 8.6* 8.1* 7.9*  --  7.4*  MG 1.8   < > 1.4* 1.8 2.0 2.1 2.1 2.2  PHOS 3.8  --   --   --   --   --  4.2 3.4   < > = values in this interval not displayed.   GFR: Estimated Creatinine Clearance: 17.2 mL/min (A) (by C-G formula based on SCr of 6.06 mg/dL (H)). Recent Labs  Lab 07/03/23 0450 07/04/23 0547 07/06/23 0305 07/07/23 0339 07/08/23 0828 07/09/23 0504  PROCALCITON 0.17  --   --   --  0.54  --   WBC 4.3   < > 10.1 13.7* 15.7* 17.3*   < > = values in this interval not displayed.    Liver Function Tests: Recent Labs  Lab 07/05/23 0750 07/06/23 0305 07/07/23 0339 07/08/23 0828 07/09/23 0504  AST 30 27 13* 27 27  ALT 12 13 11 16 25   ALKPHOS 34* 30* 31* 37* 43  BILITOT 0.7 0.7 0.5 0.5 0.6  PROT >12.0* >12.0* >12.0* 11.1* 10.4*  ALBUMIN  <1.5* 1.5* <1.5* <1.5* <1.5*   No results for input(s):  "LIPASE", "AMYLASE" in the last 168 hours. Recent Labs  Lab 07/05/23 1716 07/07/23 0339 07/08/23 0828  AMMONIA 81* 70* 116*    ABG    Component Value Date/Time   PHART 7.33 (L) 07/08/2023 1252   PCO2ART 47 07/08/2023 1252   PO2ART 369 (H) 07/08/2023 1252   HCO3 24.3 07/08/2023 1252   TCO2 28 01/29/2023 1044   ACIDBASEDEF 1.5 07/08/2023 1252   O2SAT 100 07/08/2023 1252     Coagulation Profile: Recent Labs  Lab 07/05/23 2227 07/07/23 2030  INR 2.1* 1.9*    Cardiac Enzymes: No results for input(s): "CKTOTAL", "CKMB", "CKMBINDEX", "TROPONINI" in the last 168 hours.  HbA1C: Hgb A1c MFr Bld  Date/Time Value Ref Range Status  01/29/2023 02:40 PM 6.7 (H) 4.8 - 5.6 % Final    Comment:    (NOTE) Pre diabetes:          5.7%-6.4%  Diabetes:              >6.4%  Glycemic control for   <7.0% adults with diabetes   11/12/2021 11:30 AM 6.5 (H) 4.8 - 5.6 % Final    Comment:    (NOTE) Pre diabetes:          5.7%-6.4%  Diabetes:              >6.4%  Glycemic control for   <7.0% adults with diabetes     CBG: Recent Labs  Lab 07/08/23 1606 07/08/23 1927 07/08/23 2323 07/09/23 0437 07/09/23 0750  GLUCAP 201* 237* 244* 232* 248*    Review of Systems:   Unable to provide history  Past Medical History:  He,  has a past medical history of Anxiety, Arthritis, Asthma, Bipolar disorder (HCC), Current every day smoker, Depression, Diabetes mellitus, type II (HCC), Dyspnea, History of kidney stones, History of pneumonia, Hyperlipidemia, Hypertension, Morbid obesity (HCC), Sedentary lifestyle, Seizures (HCC), and Sleep apnea.   Surgical History:   Past Surgical History:  Procedure Laterality Date   CARDIAC CATHETERIZATION  2020   carpal tunel Left    COLONOSCOPY     HARDWARE REMOVAL Left 07/26/2022   Procedure: HARDWARE REMOVAL ELBOW;  Surgeon: Laneta Pintos, MD;  Location: [facility] OR;  Service: Orthopedics;  Laterality: Left;  ORIF HUMERUS FRACTURE Left 01/16/2022    Procedure: OPEN REDUCTION INTERNAL FIXATION (ORIF) DISTAL HUMERUS FRACTURE;  Surgeon: Laneta Pintos, MD;  Location: [facility] OR;  Service: Orthopedics;  Laterality: Left;   ORIF HUMERUS FRACTURE Left 01/29/2023   Procedure: OPEN REDUCTION INTERNAL FIXATION (ORIF) DISTAL HUMERUS FRACTURE;  Surgeon: Laneta Pintos, MD;  Location: [facility] OR;  Service: Orthopedics;  Laterality: Left;   OTHER SURGICAL HISTORY     R & L shoulder   OTHER SURGICAL HISTORY Right    Knee surgery x several   TOTAL HIP ARTHROPLASTY Left 12/26/2017   Procedure: LEFT TOTAL HIP ARTHROPLASTY ANTERIOR APPROACH;  Surgeon: Arnie Lao, MD;  Location: [facility];  Service: Orthopedics;  Laterality: Left;   TOTAL KNEE ARTHROPLASTY Left 11/23/2021   Procedure: LEFT TOTAL KNEE ARTHROPLASTY;  Surgeon: Arnie Lao, MD;  Location: [facility];  Service: Orthopedics;  Laterality: Left;     Social History:   reports that he has been smoking cigarettes. He has a 12.5 pack-year smoking history. He has never used smokeless tobacco. He reports current drug use. Drug: Marijuana. He reports that he does not drink alcohol.   Family History:  His family history includes Diabetes in his brother, father, mother, and sister; Heart attack in his father and mother; Hypertension in his brother, father, mother, and sister; Osteoporosis in his mother.   Allergies Allergies  Allergen Reactions   Morphine And Codeine Anaphylaxis   Shellfish Allergy Anaphylaxis   Betadine  [Povidone Iodine ] Itching   Bupropion Other (See Comments)    Shaking of the body, hallucinations    Chlorhexidine  Hives    Patient reports never had issues CHG with mouth rinse.   Influenza Vaccines Hives   Metoprolol  Rash     Home Medications  Prior to Admission medications   Medication Sig Start Date End Date Taking? Authorizing Provider  albuterol  (PROVENTIL  HFA;VENTOLIN  HFA) 108 (90 Base) MCG/ACT inhaler Inhale 2 puffs into the lungs every 6 (six) hours as needed  for wheezing or shortness of breath.   Yes [provider]  albuterol  (PROVENTIL ) (2.5 MG/3ML) 0.083% nebulizer solution 2.5 mg every 2 (two) hours as needed for wheezing or shortness of breath.   Yes [provider]  aspirin  EC 81 MG tablet Take 81 mg by mouth daily. Swallow whole.   Yes [provider]  busPIRone  (BUSPAR ) 15 MG tablet Take 15 mg by mouth at bedtime.   Yes [provider]  celecoxib  (CELEBREX ) 200 MG capsule Take 200 mg by mouth daily.   Yes [provider]  clonazePAM (KLONOPIN) 1 MG tablet Take 1 mg by mouth once as needed for anxiety.   Yes [provider]  cyclobenzaprine  (FLEXERIL ) 5 MG tablet Take 1 tablet (5 mg total) by mouth 3 (three) times daily as needed for muscle spasms. Patient taking differently: Take 5 mg by mouth daily as needed for muscle spasms. 01/30/23  Yes Versie Gores, PA-C  divalproex  (DEPAKOTE ) 500 MG DR tablet Take 500 mg by mouth 2 (two) times daily.   Yes [provider]  EPINEPHrine  0.3 mg/0.3 mL IJ SOAJ injection Inject 0.3 mg into the muscle as needed for anaphylaxis. 08/28/21  Yes [provider]  esomeprazole (NEXIUM) 40 MG capsule Take 40 mg by mouth in the morning.   Yes [provider]  Fluticasone -Salmeterol (ADVAIR) 500-50 MCG/DOSE AEPB Inhale 2 puffs into the lungs in the morning.   Yes [provider]  furosemide  (LASIX ) 40 MG tablet Take  40 mg by mouth daily as needed for fluid or edema.   Yes [provider]  gabapentin  (NEURONTIN ) 300 MG capsule Take 300-600 mg by mouth See admin instructions. 300 mg in the morning, 600 mg at bedtime   Yes [provider]  hydrALAZINE  (APRESOLINE ) 10 MG tablet Take 10 mg by mouth in the morning and at bedtime.   Yes [provider]  hydrochlorothiazide  (HYDRODIURIL ) 25 MG tablet Take 25 mg by mouth daily.   Yes [provider]  lisinopril  (ZESTRIL ) 40 MG tablet Take 40 mg by mouth  daily. 03/25/23  Yes [provider]  metFORMIN  (GLUCOPHAGE ) 1000 MG tablet Take 1,000 mg by mouth 2 (two) times daily with a meal.   Yes [provider]  montelukast  (SINGULAIR ) 10 MG tablet Take 10 mg by mouth at bedtime.   Yes [provider]  OVER THE COUNTER MEDICATION Take 400 mg by mouth at bedtime. Burdock root   Yes [provider]  oxyCODONE  (ROXICODONE ) 5 MG immediate release tablet Take 1 tablet (5 mg total) by mouth every 4 (four) hours as needed for severe pain (pain score 7-10). 01/30/23  Yes Versie Gores, PA-C  PARoxetine  (PAXIL ) 40 MG tablet Take 40 mg by mouth every morning.   Yes [provider]  spironolactone  (ALDACTONE ) 25 MG tablet Take 25 mg by mouth at bedtime. 10/07/21  Yes [provider]  tamsulosin  (FLOMAX ) 0.4 MG CAPS capsule Take 0.4 mg by mouth at bedtime. 10/07/21  Yes [provider]  testosterone  cypionate (DEPOTESTOSTERONE CYPIONATE) 200 MG/ML injection Inject 0.5 mLs (100 mg total) into the muscle every 14 (fourteen) days. 09/17/16  Yes Nida, Gebreselassie W, MD  verapamil  (CALAN -SR) 120 MG CR tablet Take 120 mg by mouth daily.   Yes [provider]  Vitamin D , Ergocalciferol , (DRISDOL ) 1.25 MG (50000 UNIT) CAPS capsule Take 1 capsule (50,000 Units total) by mouth every Thursday. Patient taking differently: Take 50,000 Units by mouth once a week. Tuesday 02/06/23  Yes Versie Gores, PA-C  buPROPion (WELLBUTRIN XL) 150 MG 24 hr tablet Take 150 mg by mouth daily. Patient not taking: Reported on 07/04/2023    [provider]    CRITICAL CARE Performed by: Casimiro Cleaves   Total critical care time: 40 minutes  JD Carliss Chess [facility] 07/09/2023, 9:00 AM  Please see [email or web address] for pager details.  From 7A-7P if no response, please call [phone]. After hours, please call ELink [phone].

## 2023-07-09 NOTE — Progress Notes (Signed)
 Per Dannie Duval PA, bolus 100c fent given before HD cath placement. Patient still agitated. 150cc bolus given of Fent per MD. Patient still restless during procedure. Verbal order to give 2mg  of versed . See MAR. Rass -2 patient stable at this time.

## 2023-07-09 NOTE — Progress Notes (Signed)
 Rounding Note    Patient Name: Frank [name]. Date of Encounter: 07/09/2023  [facility] Cardiologist: None   Subjective   Cr 6.1 today, Hgb 7.4 .  Intubated and sedated.  Not following commands  Inpatient Medications    Scheduled Meds:  famotidine   10.4 mg Per Tube Daily   feeding supplement (PROSource TF20)  60 mL Per Tube Daily   insulin  aspart  0-15 Units Subcutaneous Q4H   lactulose   30 g Per Tube TID   midazolam        midazolam   2 mg Intravenous Once   nicotine   14 mg Transdermal Daily   mouth rinse  15 mL Mouth Rinse 4 times per day   mouth rinse  15 mL Mouth Rinse Q2H   pneumococcal 20-valent conjugate vaccine  0.5 mL Intramuscular Tomorrow-1000   sodium chloride  flush  10-40 mL Intracatheter Q12H   valproic  acid  250 mg Per Tube Q6H   Continuous Infusions:  amiodarone  30 mg/hr (07/09/23 0938)   dexmedetomidine  (PRECEDEX ) IV infusion 0.08 mcg/kg/hr (07/09/23 0922)   feeding supplement (VITAL 1.5 CAL) 25 mL/hr at 07/09/23 0938   fentaNYL  infusion INTRAVENOUS 150 mcg/hr (07/09/23 0938)   heparin  10,000 units/ 20 mL infusion syringe     norepinephrine  (LEVOPHED ) Adult infusion 8 mcg/min (07/09/23 0938)   piperacillin -tazobactam (ZOSYN )  IV     prismasol  BGK 4/2.5     prismasol  BGK 4/2.5     prismasol  BGK 4/2.5     rasburicase  (ELITEK ) 6 mg in sodium chloride  0.9 % 46 mL IVPB     vasopressin  0.03 Units/min (07/09/23 0938)   PRN Meds: acetaminophen  (TYLENOL ) oral liquid 160 mg/5 mL **OR** acetaminophen , fentaNYL  (SUBLIMAZE ) injection, fentaNYL  (SUBLIMAZE ) injection, haloperidol  lactate, heparin , hydrALAZINE , labetalol , LORazepam , LORazepam , midazolam , mouth rinse, mouth rinse, mouth rinse, oxyCODONE , polyethylene glycol, sodium chloride  flush   Vital Signs    Vitals:   07/09/23 0830 07/09/23 0845 07/09/23 0850 07/09/23 0900  BP: (!) 99/54 (!) 98/53  (!) 101/52  Pulse: 80 80  80  Resp: 20 (!) 22  19  Temp:      TempSrc:      SpO2: 96% 97% 97%  97%  Weight:      Height:        Intake/Output Summary (Last 24 hours) at 07/09/2023 1000 Last data filed at 07/09/2023 4259 Gross per 24 hour  Intake 4802.33 ml  Output 1160 ml  Net 3642.33 ml      07/09/2023    5:00 AM 07/08/2023    5:00 AM 07/05/2023   11:32 AM  Last 3 Weights  Weight (lbs) 239 lb 3.2 oz 227 lb 11.8 oz 236 lb 8.9 oz  Weight (kg) 108.5 kg 103.3 kg 107.3 kg      Telemetry    Sinus rhythm, rate 80s- Personally Reviewed  ECG    No new ECG- Personally Reviewed  Physical Exam   GEN: Intubated Neck: CVC in right IJ Cardiac: RRR no murmurs, rubs, or gallops.  Respiratory: Diminished breath sounds GI: Soft MS: No edema Neuro: Not answering questions or following commands Psych: Unable to assess  Labs    High Sensitivity Troponin:  No results for input(s): "TROPONINIHS" in the last 720 hours.   Chemistry Recent Labs  Lab 07/07/23 0339 07/08/23 0828 07/08/23 1800 07/09/23 0504  NA 144 145  --  146*  K 2.9* 3.3*  --  4.6  CL 117* 119*  --  118*  CO2 22 20*  --  20*  GLUCOSE 175* 182*  --  281*  BUN 63* 77*  --  97*  CREATININE 4.83* 4.72*  --  6.06*  CALCIUM  8.1* 7.9*  --  7.4*  MG 2.0 2.1 2.1 2.2  PROT >12.0* 11.1*  --  10.4*  ALBUMIN  <1.5* <1.5*  --  <1.5*  AST 13* 27  --  27  ALT 11 16  --  25  ALKPHOS 31* 37*  --  43  BILITOT 0.5 0.5  --  0.6  GFRNONAA 13* 13*  --  10*  ANIONGAP 5 6  --  8    Lipids No results for input(s): "CHOL", "TRIG", "HDL", "LABVLDL", "LDLCALC", "CHOLHDL" in the last 168 hours.  Hematology Recent Labs  Lab 07/07/23 0339 07/07/23 1107 07/07/23 2030 07/08/23 0828 07/08/23 1800 07/09/23 0504  WBC 13.7*  --   --  15.7*  --  17.3*  RBC 2.35*  --   --  2.00*  --  2.59*  HGB 6.3*   < >  --  5.6* 6.2* 7.4*  HCT 22.2*   < >  --  19.5* 21.4* 23.5*  MCV 94.5  --   --  97.5  --  90.7  MCH 26.8  --   --  28.0  --  28.6  MCHC 28.4*  --   --  28.7*  --  31.5  RDW 27.6*  --   --  25.6*  --  20.7*  PLT 140*  --  137*  147*  --  108*   < > = values in this interval not displayed.   Thyroid No results for input(s): "TSH", "FREET4" in the last 168 hours.  BNPNo results for input(s): "BNP", "PROBNP" in the last 168 hours.  DDimer  Recent Labs  Lab 07/07/23 2030  DDIMER 0.93*     Radiology    CT ABDOMEN PELVIS WO CONTRAST Result Date: 07/08/2023 CLINICAL DATA:  Palpable right groin mass. Anemia requiring 3 units of packed red blood cells. Sepsis due to acquired pneumonia. Lytic bone lesions with a clinical concern for a new diagnosis of multiple myeloma. Type 2 diabetes. EXAM: CT ABDOMEN AND PELVIS WITHOUT CONTRAST TECHNIQUE: Multidetector CT imaging of the abdomen and pelvis was performed following the standard protocol without IV contrast. RADIATION DOSE REDUCTION: This exam was performed according to the departmental dose-optimization program which includes automated exposure control, adjustment of the mA and/or kV according to patient size and/or use of iterative reconstruction technique. COMPARISON:  CT-guided right iliac bone marrow aspiration and core biopsy dated 07/04/2023. FINDINGS: Lower chest: Multifocal patchy opacities in both lower lobes. Small left pleural effusion and minimal right pleural effusion. Borderline enlarged heart. Hepatobiliary: Normal-appearing liver. Dependent calcification in the gallbladder without gallbladder wall thickening or pericholecystic fluid. Pancreas: Unremarkable. No pancreatic ductal dilatation or surrounding inflammatory changes. Spleen: Normal in size without focal abnormality. Adrenals/Urinary Tract: Foley catheter in the urinary bladder with no significant urine in the bladder. Single tiny calculus in each kidney. Small, exophytic high density left renal cyst compatible with a hemorrhagic or proteinaceous cyst. Probable subcentimeter exophytic left renal cyst, difficult to assess due to streak artifacts produced by the patient's left arm. This could be assessed with renal  ultrasound. Unremarkable adrenal glands and ureters. Stomach/Bowel: Stomach is within normal limits. Appendix appears normal. No evidence of bowel wall thickening, distention, or inflammatory changes. Vascular/Lymphatic: Atheromatous arterial calcifications without aneurysm. No enlarged lymph nodes. Reproductive: Normal-sized prostate gland containing coarse calcifications. Other: Large right buttock and  groin hematoma containing a fluid/hematocrit level. This measures 36.0 x 11.4 x 9.7 cm in maximum dimensions on sagittal and coronal images 32/7 and 39/6 respectively. Musculoskeletal: Left hip prosthesis with associated streak artifacts. Nondisplaced right 10th posterolateral rib fracture. Old, healed left posterolateral 10th rib fracture. Multiple small to medium-sized lytic lesions in multiple lumbar and lower thoracic vertebral bodies and in the pelvic bones bilaterally. Moderate right hip degenerative changes. IMPRESSION: 1. Large acute right buttock and groin hematoma measuring 36.0 x 11.4 x 9.7 cm. 2. Nondisplaced right 10th posterolateral rib fracture. 3. Multiple small to medium-sized lytic lesions in multiple lumbar and lower thoracic vertebral bodies and in the pelvic bones bilaterally, compatible with the clinical suspicion of multiple myeloma. Lytic metastases can also have this appearance. 4. Multifocal patchy opacities in both lower lobes, compatible with known pneumonia. 5. Small left pleural effusion and minimal right pleural effusion. 6. Cholelithiasis. 7. Tiny bilateral renal calculi. 8. Probable subcentimeter exophytic left renal cyst, difficult to assess due to streak artifacts produced by the patient's left arm. This could be assessed with renal ultrasound. Electronically Signed   By: Catherin Closs M.D.   On: 07/08/2023 16:07   DG CHEST PORT 1 VIEW Result Date: 07/08/2023 CLINICAL DATA:  [id number] Endotracheally intubated [id number] EXAM: PORTABLE CHEST 1 VIEW COMPARISON:  07/08/2023 FINDINGS: The  endotracheal tube is positioned just to the left of midline, likely within the mid trachea, likely due to patient rotation. Mild cardiomegaly. No focal airspace consolidation, pleural effusion, or pneumothorax. No cardiomegaly. No acute fracture or destructive lesion. Moderate osteoarthritis of the right shoulder. Multilevel degenerative disc disease of the spine. Weighted feeding tube courses below the diaphragm with the distal tip not included in the field of view. IMPRESSION: 1. The endotracheal tube is positioned just to the left of midline, likely within the mid trachea, but abnormally positioned due to patient rotation. No pneumothorax. 2. Weighted feeding tube courses below the diaphragm, distal tip outside the field of view. Electronically Signed   By: Rance Burrows M.D.   On: 07/08/2023 14:02   DG CHEST PORT 1 VIEW Result Date: 07/08/2023 CLINICAL DATA:  Dyspnea. EXAM: PORTABLE CHEST 1 VIEW COMPARISON:  07/06/2023 FINDINGS: Feeding tube extends down the esophagus into the stomach. Tube appears to be coiled in the stomach but the tip is not clearly identified. Heart size is within normal limits and stable. Prominent vascular and interstitial lung markings are unchanged. No focal airspace disease or lung consolidation. Again noted is severe joint space narrowing and probable degenerative changes in both shoulders. Trachea is midline. IMPRESSION: 1. Again noted are prominent vascular and interstitial lung markings. Findings could represent vascular congestion or mild edema. Minimal change since 07/06/2023. 2. Feeding tube extends into the abdomen. Electronically Signed   By: Elene Griffes M.D.   On: 07/08/2023 10:25   DG Abd 1 View Result Date: 07/07/2023 CLINICAL DATA:  Nasogastric tube placement. EXAM: ABDOMEN - 1 VIEW COMPARISON:  None Available. FINDINGS: Weighted enteric tube tip in the left upper abdomen in the region of the mid or distal stomach. No bowel dilatation in the included upper abdomen.  IMPRESSION: Weighted enteric tube tip in the mid or distal stomach. Electronically Signed   By: Chadwick Colonel M.D.   On: 07/07/2023 17:08   DG Fluoro Rm 1-60 Min - No Report Result Date: 07/07/2023 CLINICAL DATA:  Acute encephalopathy, nasogastric tube placement requested. EXAM: FLUORO RM 1-60 MIN-NO REPORT FLUOROSCOPY: Fluoroscopy Time:  0 minutes 48 seconds Radiation Exposure  Index (if provided by the fluoroscopic device): 7.0 mGy Number of Acquired Spot Images: 1 COMPARISON:  None Available. FINDINGS: Multiple attempts at nasogastric tube placement with a 20 French catheter resulted in right or left mainstem bronchus positioning. Patient was unable to swallow or follow directions to aid with insertion. Study was terminated. IMPRESSION: Unsuccessful nasogastric tube placement under fluoroscopy. Electronically Signed   By: Shearon Denis M.D.   On: 07/07/2023 14:13    Cardiac Studies     Patient Profile     58 y.o. 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 patient initially presented in to outside hospital with altered mental status and was found to have sepsis secondary to community-acquired pneumonia, acute metabolic encephalopathy secondary to sepsis, AKI, hypercalcemia, symptomatic anemia requiring 3 unit PRBCs, and lytic bone lesions.  Oncology team evaluated the patient and there was concern for new diagnosis of multiple myeloma.   Assessment & Plan    Atrial fibrillation with RVR: Likely caused by acute illness.  CHA2DS2-VASc 2 (diabetes, hypertension).  Echocardiogram with EF 65 to 70%, normal RV function, moderate left atrial enlargement, no significant valve disease, RAP 15 - He is currently in sinus rhythm.  Would continue IV  amiodarone  for now to maintain sinus rhythm.   -heparin  gtt discontinued given bleeding and anemia.  Would continue to hold heparin .    Acute respiratory failure with hypoxia: Status post intubation on 4/22.  PCCM managing  Suspected multiple myeloma: Oncology following started on treatment  AKI: Creatinine up to 5.8 on presentation, now 6.1.  Nephrology following, planning to start CRRT  Anemia: Hgb down to 5.6 on 4/22.  Found to have right groin hematoma.  Received multiple units PRBCs.  Off heparin  drip.  Hemoglobin 7.4 today  For questions or updates, please contact [facility] Please consult [email or web address] for contact info under        Signed, Wendie Hamburg, MD  07/09/2023, 10:00 AM

## 2023-07-09 NOTE — Plan of Care (Signed)
  Problem: Clinical Measurements: Goal: Cardiovascular complication will be avoided Outcome: Progressing   Problem: Nutrition: Goal: Adequate nutrition will be maintained Outcome: Progressing   Problem: Safety: Goal: Ability to remain free from injury will improve Outcome: Progressing   Problem: Skin Integrity: Goal: Risk for impaired skin integrity will decrease Outcome: Progressing   Problem: Metabolic: Goal: Ability to maintain appropriate glucose levels will improve Outcome: Progressing   Problem: Coping: Goal: Level of anxiety will decrease Outcome: Not Progressing   Problem: Elimination: Goal: Will not experience complications related to urinary retention Outcome: Not Progressing   Problem: Safety: Goal: Non-violent Restraint(s) Outcome: Not Progressing

## 2023-07-09 NOTE — Progress Notes (Signed)
 eLink Physician-Brief Progress Note Patient Name: Frank [name]. DOB: 1965-04-10 MRN: [id number]   Date of Service  07/09/2023  HPI/Events of Note  Patient with sub-optimal sedation on the ventilator.  eICU Interventions  Will add low gtt ceiling Fentanyl  infusion to optimize sedation.        Kahla Risdon U Lynzee Lindquist 07/09/2023, 12:19 AM

## 2023-07-09 NOTE — Progress Notes (Signed)
 [facility] NEPHROLOGY PROGRESS NOTE  Assessment/ Plan: Pt is a 58 y.o. yo male with past medical history significant for HTN, HLD, type II DM, BPH, seizure who was initially presented at [facility] due to AMS, admitted for sepsis due to pneumonia, encephalopathy, sepsis, AKI, hypercalcemia symptomatic anemia and Lytic pulm lesion concern for multiple myeloma.  # AKI on CKD 3a - b/l creat 1.1- 1.5 from 2024, eGFR 53- >60 ml/min. Creat here 5.8 on presentation at OSH and last several days in the 4.0- 4.8 range. AKI due to possible myeloma kidney +/- hyperCa +/- ACEi (home med).  Initially treated with IV fluid and then required Lasix  because of fluid overload.   Worsening renal labs with azotemia, hyperuricemia and multiple metabolic derangement.  I have discussed with the patient's wife about restarting dialysis/CRRT after placement of HD catheter.  She agreed with the plan. PCCM to place line for CRRT, discussed with Dr. Marygrace Snellen. All 4K bath, UF 50 to 100 cc an hour, no systemic heparin  because of bleeding and hematoma however he will need limited heparin  in the circuit. Continue with strict ins and out and close lab monitoring.  # Multiple myeloma: Oncologist restarted Velcade  and Cytoxan .  Management per oncology team.    # Shock-currently on Levophed  and vasopressin .  Monitor BP.  #Volume -required IV diuretics.  Basically now UF with CRRT.  # Atrial fib-  on IV amio and IV diltiazem .  #Hypercalcemia -  resolved  now. Possible myeloma, w/u in progress, oncology following. SP zometa  here and calcitonin.   #Multifocal PNA - on IV abx  #Anemia -getting PRBC transfusion.  # AMS/acute metabolic encephalopathy.  Concerned about hyperviscosity.  Oncology team and PCCM following.  Starting CRRT for azotemia.  # Hyperuricemia: Rasburicase  and dialysis.  I have discussed with ICU team and I have called patient's wife to discuss about dialysis and to update current  clinical condition.   Subjective: Seen and examined in ICU.  He is intubated, sedated.  On Levophed  and vasopressin .  Urine output is around 800 cc.   Objective Vital signs in last 24 hours: Vitals:   07/09/23 0307 07/09/23 0500 07/09/23 0700 07/09/23 0747  BP:   100/61 (!) 98/59  Pulse:   79 80  Resp:   19 17  Temp: (!) 102 F (38.9 C)   (!) 102.1 F (38.9 C)  TempSrc: Axillary   Axillary  SpO2:   99% 98%  Weight:  108.5 kg    Height:       Weight change: 5.2 kg  Intake/Output Summary (Last 24 hours) at 07/09/2023 0814 Last data filed at 07/09/2023 4098 Gross per 24 hour  Intake 4724.21 ml  Output 1360 ml  Net 3364.21 ml       Labs: RENAL PANEL Recent Labs  Lab 07/03/23 0505 07/04/23 0547 07/05/23 0750 07/06/23 0305 07/07/23 0339 07/08/23 0828 07/08/23 1800 07/09/23 0504  NA 135   < > 141 141 144 145  --  146*  K 3.6   < > 3.5 3.0* 2.9* 3.3*  --  4.6  CL 112*   < > 114* 115* 117* 119*  --  118*  CO2 20*   < > 21* 21* 22 20*  --  20*  GLUCOSE 104*   < > 130* 137* 175* 182*  --  281*  BUN 34*   < > 40* 51* 63* 77*  --  97*  CREATININE 4.32*   < > 4.34* 4.64* 4.83* 4.72*  --  6.06*  CALCIUM  9.9   < > 9.3 8.6* 8.1* 7.9*  --  7.4*  MG 1.8   < > 1.4* 1.8 2.0 2.1 2.1 2.2  PHOS 3.8  --   --   --   --   --  4.2 3.4  ALBUMIN  <1.5*   < > <1.5* 1.5* <1.5* <1.5*  --  <1.5*   < > = values in this interval not displayed.    Liver Function Tests: Recent Labs  Lab 07/07/23 0339 07/08/23 0828 07/09/23 0504  AST 13* 27 27  ALT 11 16 25   ALKPHOS 31* 37* 43  BILITOT 0.5 0.5 0.6  PROT >12.0* 11.1* 10.4*  ALBUMIN  <1.5* <1.5* <1.5*   No results for input(s): "LIPASE", "AMYLASE" in the last 168 hours. Recent Labs  Lab 07/05/23 1716 07/07/23 0339 07/08/23 0828  AMMONIA 81* 70* 116*   CBC: Recent Labs    07/07/23 0339 07/07/23 1107 07/08/23 0828 07/08/23 1800 07/09/23 0504  HGB 6.3* 6.9* 5.6* 6.2* 7.4*  MCV 94.5  --  97.5  --  90.7    Cardiac  Enzymes: No results for input(s): "CKTOTAL", "CKMB", "CKMBINDEX", "TROPONINI" in the last 168 hours. CBG: Recent Labs  Lab 07/08/23 1606 07/08/23 1927 07/08/23 2323 07/09/23 0437 07/09/23 0750  GLUCAP 201* 237* 244* 232* 248*    Iron Studies: No results for input(s): "IRON", "TIBC", "TRANSFERRIN", "FERRITIN" in the last 72 hours. Studies/Results: CT ABDOMEN PELVIS WO CONTRAST Result Date: 07/08/2023 CLINICAL DATA:  Palpable right groin mass. Anemia requiring 3 units of packed red blood cells. Sepsis due to acquired pneumonia. Lytic bone lesions with a clinical concern for a new diagnosis of multiple myeloma. Type 2 diabetes. EXAM: CT ABDOMEN AND PELVIS WITHOUT CONTRAST TECHNIQUE: Multidetector CT imaging of the abdomen and pelvis was performed following the standard protocol without IV contrast. RADIATION DOSE REDUCTION: This exam was performed according to the departmental dose-optimization program which includes automated exposure control, adjustment of the mA and/or kV according to patient size and/or use of iterative reconstruction technique. COMPARISON:  CT-guided right iliac bone marrow aspiration and core biopsy dated 07/04/2023. FINDINGS: Lower chest: Multifocal patchy opacities in both lower lobes. Small left pleural effusion and minimal right pleural effusion. Borderline enlarged heart. Hepatobiliary: Normal-appearing liver. Dependent calcification in the gallbladder without gallbladder wall thickening or pericholecystic fluid. Pancreas: Unremarkable. No pancreatic ductal dilatation or surrounding inflammatory changes. Spleen: Normal in size without focal abnormality. Adrenals/Urinary Tract: Foley catheter in the urinary bladder with no significant urine in the bladder. Single tiny calculus in each kidney. Small, exophytic high density left renal cyst compatible with a hemorrhagic or proteinaceous cyst. Probable subcentimeter exophytic left renal cyst, difficult to assess due to streak  artifacts produced by the patient's left arm. This could be assessed with renal ultrasound. Unremarkable adrenal glands and ureters. Stomach/Bowel: Stomach is within normal limits. Appendix appears normal. No evidence of bowel wall thickening, distention, or inflammatory changes. Vascular/Lymphatic: Atheromatous arterial calcifications without aneurysm. No enlarged lymph nodes. Reproductive: Normal-sized prostate gland containing coarse calcifications. Other: Large right buttock and groin hematoma containing a fluid/hematocrit level. This measures 36.0 x 11.4 x 9.7 cm in maximum dimensions on sagittal and coronal images 32/7 and 39/6 respectively. Musculoskeletal: Left hip prosthesis with associated streak artifacts. Nondisplaced right 10th posterolateral rib fracture. Old, healed left posterolateral 10th rib fracture. Multiple small to medium-sized lytic lesions in multiple lumbar and lower thoracic vertebral bodies and in the pelvic bones bilaterally. Moderate right hip degenerative changes. IMPRESSION: 1. Large  acute right buttock and groin hematoma measuring 36.0 x 11.4 x 9.7 cm. 2. Nondisplaced right 10th posterolateral rib fracture. 3. Multiple small to medium-sized lytic lesions in multiple lumbar and lower thoracic vertebral bodies and in the pelvic bones bilaterally, compatible with the clinical suspicion of multiple myeloma. Lytic metastases can also have this appearance. 4. Multifocal patchy opacities in both lower lobes, compatible with known pneumonia. 5. Small left pleural effusion and minimal right pleural effusion. 6. Cholelithiasis. 7. Tiny bilateral renal calculi. 8. Probable subcentimeter exophytic left renal cyst, difficult to assess due to streak artifacts produced by the patient's left arm. This could be assessed with renal ultrasound. Electronically Signed   By: Catherin Closs M.D.   On: 07/08/2023 16:07   DG CHEST PORT 1 VIEW Result Date: 07/08/2023 CLINICAL DATA:  [id number] Endotracheally  intubated [id number] EXAM: PORTABLE CHEST 1 VIEW COMPARISON:  07/08/2023 FINDINGS: The endotracheal tube is positioned just to the left of midline, likely within the mid trachea, likely due to patient rotation. Mild cardiomegaly. No focal airspace consolidation, pleural effusion, or pneumothorax. No cardiomegaly. No acute fracture or destructive lesion. Moderate osteoarthritis of the right shoulder. Multilevel degenerative disc disease of the spine. Weighted feeding tube courses below the diaphragm with the distal tip not included in the field of view. IMPRESSION: 1. The endotracheal tube is positioned just to the left of midline, likely within the mid trachea, but abnormally positioned due to patient rotation. No pneumothorax. 2. Weighted feeding tube courses below the diaphragm, distal tip outside the field of view. Electronically Signed   By: Rance Burrows M.D.   On: 07/08/2023 14:02   DG CHEST PORT 1 VIEW Result Date: 07/08/2023 CLINICAL DATA:  Dyspnea. EXAM: PORTABLE CHEST 1 VIEW COMPARISON:  07/06/2023 FINDINGS: Feeding tube extends down the esophagus into the stomach. Tube appears to be coiled in the stomach but the tip is not clearly identified. Heart size is within normal limits and stable. Prominent vascular and interstitial lung markings are unchanged. No focal airspace disease or lung consolidation. Again noted is severe joint space narrowing and probable degenerative changes in both shoulders. Trachea is midline. IMPRESSION: 1. Again noted are prominent vascular and interstitial lung markings. Findings could represent vascular congestion or mild edema. Minimal change since 07/06/2023. 2. Feeding tube extends into the abdomen. Electronically Signed   By: Elene Griffes M.D.   On: 07/08/2023 10:25   DG Abd 1 View Result Date: 07/07/2023 CLINICAL DATA:  Nasogastric tube placement. EXAM: ABDOMEN - 1 VIEW COMPARISON:  None Available. FINDINGS: Weighted enteric tube tip in the left upper abdomen in the region  of the mid or distal stomach. No bowel dilatation in the included upper abdomen. IMPRESSION: Weighted enteric tube tip in the mid or distal stomach. Electronically Signed   By: Chadwick Colonel M.D.   On: 07/07/2023 17:08   DG Fluoro Rm 1-60 Min - No Report Result Date: 07/07/2023 CLINICAL DATA:  Acute encephalopathy, nasogastric tube placement requested. EXAM: FLUORO RM 1-60 MIN-NO REPORT FLUOROSCOPY: Fluoroscopy Time:  0 minutes 48 seconds Radiation Exposure Index (if provided by the fluoroscopic device): 7.0 mGy Number of Acquired Spot Images: 1 COMPARISON:  None Available. FINDINGS: Multiple attempts at nasogastric tube placement with a 20 French catheter resulted in right or left mainstem bronchus positioning. Patient was unable to swallow or follow directions to aid with insertion. Study was terminated. IMPRESSION: Unsuccessful nasogastric tube placement under fluoroscopy. Electronically Signed   By: Shearon Denis M.D.   On: 07/07/2023 14:13  ECHOCARDIOGRAM COMPLETE Result Date: 07/07/2023    ECHOCARDIOGRAM REPORT   Patient Name:   Frank [name]. Date of Exam: 07/07/2023 Medical Rec #:  [id number]          Height:       74.0 in Accession #:    [id number]         Weight:       236.6 lb Date of Birth:  1965/12/12          BSA:          2.334 m Patient Age:    58 years           BP:           141/88 mmHg Patient Gender: M                  HR:           110 bpm. Exam Location:  Inpatient Procedure: 3D Echo, Cardiac Doppler, Color Doppler and Intracardiac            Opacification Agent (Both Spectral and Color Flow Doppler were            utilized during procedure). Indications:    Atrial fibrillation  History:        Patient has no prior history of Echocardiogram examinations.  Sonographer:    Juanita Shaw Referring Phys: [id number] BMWUXL PARKER  Sonographer Comments: Image acquisition challenging due to patient body habitus and Image acquisition challenging due to respiratory motion. IMPRESSIONS  1. Left  ventricular ejection fraction, by estimation, is 65 to 70%. The left ventricle has normal function. The left ventricle has no regional wall motion abnormalities. Left ventricular diastolic parameters are indeterminate.  2. Right ventricular systolic function is normal. The right ventricular size is normal. There is normal pulmonary artery systolic pressure. The estimated right ventricular systolic pressure is 35.8 mmHg.  3. Left atrial size was moderately dilated.  4. The mitral valve is normal in structure. No evidence of mitral valve regurgitation. No evidence of mitral stenosis.  5. The aortic valve is tricuspid. Aortic valve regurgitation is not visualized. Aortic valve sclerosis/calcification is present, without any evidence of aortic stenosis. Aortic valve mean gradient measures 8.0 mmHg.  6. Aortic dilatation noted. There is mild dilatation of the aortic root, measuring 39 mm.  7. The inferior vena cava is dilated in size with <50% respiratory variability, suggesting right atrial pressure of 15 mmHg.  8. The patient was in atrial fibrillation. FINDINGS  Left Ventricle: Left ventricular ejection fraction, by estimation, is 65 to 70%. The left ventricle has normal function. The left ventricle has no regional wall motion abnormalities. The left ventricular internal cavity size was normal in size. There is  no left ventricular hypertrophy. Left ventricular diastolic parameters are indeterminate. Right Ventricle: The right ventricular size is normal. No increase in right ventricular wall thickness. Right ventricular systolic function is normal. There is normal pulmonary artery systolic pressure. The tricuspid regurgitant velocity is 2.28 m/s, and  with an assumed right atrial pressure of 15 mmHg, the estimated right ventricular systolic pressure is 35.8 mmHg. Left Atrium: Left atrial size was moderately dilated. Right Atrium: Right atrial size was normal in size. Pericardium: There is no evidence of pericardial  effusion. Mitral Valve: The mitral valve is normal in structure. No evidence of mitral valve regurgitation. No evidence of mitral valve stenosis. MV peak gradient, 5.4 mmHg. The mean mitral valve gradient is 2.0 mmHg. Tricuspid Valve: The tricuspid  valve is normal in structure. Tricuspid valve regurgitation is trivial. Aortic Valve: The aortic valve is tricuspid. Aortic valve regurgitation is not visualized. Aortic valve sclerosis/calcification is present, without any evidence of aortic stenosis. Aortic valve mean gradient measures 8.0 mmHg. Aortic valve peak gradient measures 16.0 mmHg. Aortic valve area, by VTI measures 2.25 cm. Pulmonic Valve: The pulmonic valve was normal in structure. Pulmonic valve regurgitation is not visualized. Aorta: Aortic dilatation noted. There is mild dilatation of the aortic root, measuring 39 mm. Venous: The inferior vena cava is dilated in size with less than 50% respiratory variability, suggesting right atrial pressure of 15 mmHg. IAS/Shunts: No atrial level shunt detected by color flow Doppler.  LEFT VENTRICLE PLAX 2D LVIDd:         5.00 cm      Diastology LVIDs:         3.60 cm      LV e' medial:    7.94 cm/s LV PW:         0.60 cm      LV E/e' medial:  17.0 LV IVS:        0.90 cm      LV e' lateral:   12.90 cm/s LVOT diam:     2.00 cm      LV E/e' lateral: 10.5 LV SV:         56 LV SV Index:   24 LVOT Area:     3.14 cm  LV Volumes (MOD) LV vol d, MOD A4C: 170.0 ml LV vol s, MOD A4C: 50.6 ml LV SV MOD A4C:     170.0 ml RIGHT VENTRICLE             IVC RV Basal diam:  4.20 cm     IVC diam: 2.50 cm RV Mid diam:    2.80 cm RV S prime:     22.40 cm/s TAPSE (M-mode): 2.0 cm LEFT ATRIUM              Index        RIGHT ATRIUM           Index LA diam:        4.20 cm  1.80 cm/m   RA Area:     17.80 cm LA Vol (A2C):   106.0 ml 45.41 ml/m  RA Volume:   43.80 ml  18.76 ml/m LA Vol (A4C):   86.7 ml  37.14 ml/m LA Biplane Vol: 101.0 ml 43.27 ml/m  AORTIC VALVE                      PULMONIC VALVE AV Area (Vmax):    2.04 cm      PV Vmax:       1.42 m/s AV Area (Vmean):   2.04 cm      PV Peak grad:  8.1 mmHg AV Area (VTI):     2.25 cm AV Vmax:           200.00 cm/s AV Vmean:          125.000 cm/s AV VTI:            0.248 m AV Peak Grad:      16.0 mmHg AV Mean Grad:      8.0 mmHg LVOT Vmax:         130.00 cm/s LVOT Vmean:        81.200 cm/s LVOT VTI:          0.178 m LVOT/AV  VTI ratio: 0.72  AORTA Ao Root diam: 3.90 cm Ao Asc diam:  3.30 cm MITRAL VALVE                TRICUSPID VALVE MV Area (PHT): 5.70 cm     TR Peak grad:   20.8 mmHg MV Area VTI:   2.31 cm     TR Vmax:        228.00 cm/s MV Peak grad:  5.4 mmHg MV Mean grad:  2.0 mmHg     SHUNTS MV Vmax:       1.16 m/s     Systemic VTI:  0.18 m MV Vmean:      65.4 cm/s    Systemic Diam: 2.00 cm MV Decel Time: 133 msec MV E velocity: 135.00 cm/s Dalton McleanMD Electronically signed by Archer Bear Signature Date/Time: 07/07/2023/9:53:50 AM    Final     Medications: Infusions:  amiodarone  30 mg/hr (07/09/23 0623)   feeding supplement (VITAL 1.5 CAL) 25 mL/hr at 07/09/23 0623   fentaNYL  infusion INTRAVENOUS 75 mcg/hr (07/09/23 0623)   norepinephrine  (LEVOPHED ) Adult infusion 7 mcg/min (07/09/23 0102)   piperacillin -tazobactam (ZOSYN )  IV     rasburicase  (ELITEK ) 6 mg in sodium chloride  0.9 % 46 mL IVPB     vasopressin  0.03 Units/min (07/09/23 0623)    Scheduled Medications:  docusate  100 mg Per Tube BID   famotidine   10.4 mg Per Tube Daily   feeding supplement (PROSource TF20)  60 mL Per Tube Daily   insulin  aspart  0-9 Units Subcutaneous Q4H   lactulose   30 g Per Tube TID   nicotine   14 mg Transdermal Daily   mouth rinse  15 mL Mouth Rinse 4 times per day   mouth rinse  15 mL Mouth Rinse Q2H   pneumococcal 20-valent conjugate vaccine  0.5 mL Intramuscular Tomorrow-1000   polyethylene glycol  17 g Per Tube Daily   sodium chloride  flush  10-40 mL Intracatheter Q12H   valproic  acid  250 mg Per Tube Q6H    have  reviewed scheduled and prn medications.  Physical Exam: General: Critically ill looking male, intubated, sedated Heart:RRR, s1s2 nl Lungs: Coarse breath sound bilateral Abdomen:soft, Non-tender, non-distended Extremities:No edema Neurology: Sedated  Clementine Cutting 07/09/2023,8:14 AM  LOS: 6 days

## 2023-07-09 NOTE — Progress Notes (Signed)
 This the situation certainly is no better.  He is now intubated.  I suspect a lot of this is for airway protection.  He apparently was having some hemoptysis.  His hemoglobin was dropping quite a bit.  He had been on heparin  for atrial fibrillation.  He has been transfused quite a bit.  He had a CT of the abdomen pelvis this morning.  He has a large right buttock hematoma measuring 36 x 11 x 10 cm.  Otherwise, there is no retroperitoneal bleed.  He did receive chemotherapy yesterday.  Hopefully, we will start to see some improvement in his protein levels in the next few days.  Surprisingly, the serum viscosity still is not back yet.  Is only been 6 to 7 days that we sent this off.  His 24-hour urine shows that he has a moderate amount of Kappa light chain.  This was not all that great in my opinion.  He has 355 mg/L of Kappa light chain.   His renal function is about the same.  His last BUN was 77 creatinine 4.72.  Some of that BUN elevation could be from bleeding.  His blood sugar was 182.  His ammonia is 116.  His bone marrow biopsy came back again which is no surprise that a has over 80% plasma cells in the bone marrow.  This is very complicated.  He is getting wonderful care in the ICU.  A lot of staff are working really hard to pull him through this.  Again, he chemotherapy yesterday.  He will get a dose of Velcade  I think on Saturday.  He is on pressor support also.  He is off anticoagulation.  The cardiac monitor does show sinus rhythm now.  He is on amiodarone  drip.  He is having temperatures.  I suppose this on temperatures could be from this large hematoma.  As it breaks down, he could certainly lead to some low-grade temps.  He is on broad-spectrum antibiotic coverage.  It will be interesting to see what the cytogenetics and FISH panel are for the myeloma.  It would not surprise me if he does have adverse cytogenetics.  Again, the ICU staff are doing a fantastic job with  him.   [name], MD  Lina Render 29:11

## 2023-07-09 NOTE — Plan of Care (Signed)

## 2023-07-10 ENCOUNTER — Encounter (HOSPITAL_COMMUNITY): Payer: Self-pay | Admitting: Internal Medicine

## 2023-07-10 DIAGNOSIS — N179 Acute kidney failure, unspecified: Secondary | ICD-10-CM | POA: Diagnosis not present

## 2023-07-10 DIAGNOSIS — J9601 Acute respiratory failure with hypoxia: Secondary | ICD-10-CM | POA: Diagnosis not present

## 2023-07-10 DIAGNOSIS — R531 Weakness: Secondary | ICD-10-CM | POA: Diagnosis not present

## 2023-07-10 DIAGNOSIS — I4891 Unspecified atrial fibrillation: Secondary | ICD-10-CM | POA: Diagnosis not present

## 2023-07-10 DIAGNOSIS — G9341 Metabolic encephalopathy: Secondary | ICD-10-CM | POA: Diagnosis not present

## 2023-07-10 DIAGNOSIS — C9000 Multiple myeloma not having achieved remission: Secondary | ICD-10-CM | POA: Diagnosis not present

## 2023-07-10 LAB — GLUCOSE, CAPILLARY
Glucose-Capillary: 239 mg/dL — ABNORMAL HIGH (ref 70–99)
Glucose-Capillary: 284 mg/dL — ABNORMAL HIGH (ref 70–99)
Glucose-Capillary: 277 mg/dL — ABNORMAL HIGH (ref 70–99)
Glucose-Capillary: 266 mg/dL — ABNORMAL HIGH (ref 70–99)
Glucose-Capillary: 246 mg/dL — ABNORMAL HIGH (ref 70–99)

## 2023-07-10 LAB — RENAL FUNCTION PANEL
Albumin: 1.6 g/dL — ABNORMAL LOW (ref 3.5–5.0)
Albumin: 1.8 g/dL — ABNORMAL LOW (ref 3.5–5.0)
Anion gap: 6 (ref 5–15)
Anion gap: 6 (ref 5–15)
BUN: 62 mg/dL — ABNORMAL HIGH (ref 6–20)
BUN: 53 mg/dL — ABNORMAL HIGH (ref 6–20)
CO2: 22 mmol/L (ref 22–32)
CO2: 24 mmol/L (ref 22–32)
Calcium: 6.9 mg/dL — ABNORMAL LOW (ref 8.9–10.3)
Calcium: 7.1 mg/dL — ABNORMAL LOW (ref 8.9–10.3)
Chloride: 105 mmol/L (ref 98–111)
Chloride: 103 mmol/L (ref 98–111)
Creatinine, Ser: 3.74 mg/dL — ABNORMAL HIGH (ref 0.61–1.24)
Creatinine, Ser: 3.24 mg/dL — ABNORMAL HIGH (ref 0.61–1.24)
GFR, Estimated: 18 mL/min — ABNORMAL LOW (ref >60)
GFR, Estimated: 21 mL/min — ABNORMAL LOW (ref >60)
Glucose, Bld: 284 mg/dL — ABNORMAL HIGH (ref 70–99)
Glucose, Bld: 267 mg/dL — ABNORMAL HIGH (ref 70–99)
Phosphorus: 3 mg/dL (ref 2.5–4.6)
Phosphorus: 2.6 mg/dL (ref 2.5–4.6)
Potassium: 4 mmol/L (ref 3.5–5.1)
Potassium: 4.3 mmol/L (ref 3.5–5.1)
Sodium: 133 mmol/L — ABNORMAL LOW (ref 135–145)
Sodium: 133 mmol/L — ABNORMAL LOW (ref 135–145)

## 2023-07-10 LAB — APTT: aPTT: 31 s (ref 24–36)

## 2023-07-10 LAB — CBC WITH DIFFERENTIAL/PLATELET
Abs Immature Granulocytes: 0 10*3/uL (ref 0.00–0.07)
Basophils Absolute: 0 10*3/uL (ref 0.0–0.1)
Basophils Relative: 0 %
Eosinophils Absolute: 0 10*3/uL (ref 0.0–0.5)
Eosinophils Relative: 0 %
HCT: 23.8 % — ABNORMAL LOW (ref 39.0–52.0)
Hemoglobin: 7.3 g/dL — ABNORMAL LOW (ref 13.0–17.0)
Lymphocytes Relative: 12 %
Lymphs Abs: 1.5 10*3/uL (ref 0.7–4.0)
MCH: 28 pg (ref 26.0–34.0)
MCHC: 30.7 g/dL (ref 30.0–36.0)
MCV: 91.2 fL (ref 80.0–100.0)
Monocytes Absolute: 0.4 10*3/uL (ref 0.1–1.0)
Monocytes Relative: 3 %
Neutro Abs: 10.4 10*3/uL — ABNORMAL HIGH (ref 1.7–7.7)
Neutrophils Relative %: 85 %
Platelets: 112 10*3/uL — ABNORMAL LOW (ref 150–400)
RBC: 2.61 MIL/uL — ABNORMAL LOW (ref 4.22–5.81)
RDW: 21.1 % — ABNORMAL HIGH (ref 11.5–15.5)
Smear Review: NORMAL
WBC: 12.2 10*3/uL — ABNORMAL HIGH (ref 4.0–10.5)
nRBC: 3.2 % — ABNORMAL HIGH (ref 0.0–0.2)

## 2023-07-10 LAB — COMPREHENSIVE METABOLIC PANEL WITH GFR
ALT: 38 U/L (ref 0–44)
AST: 21 U/L (ref 15–41)
Albumin: 1.7 g/dL — ABNORMAL LOW (ref 3.5–5.0)
Alkaline Phosphatase: 42 U/L (ref 38–126)
Anion gap: 6 (ref 5–15)
BUN: 66 mg/dL — ABNORMAL HIGH (ref 6–20)
CO2: 23 mmol/L (ref 22–32)
Calcium: 6.9 mg/dL — ABNORMAL LOW (ref 8.9–10.3)
Chloride: 104 mmol/L (ref 98–111)
Creatinine, Ser: 3.64 mg/dL — ABNORMAL HIGH (ref 0.61–1.24)
GFR, Estimated: 18 mL/min — ABNORMAL LOW (ref >60)
Glucose, Bld: 284 mg/dL — ABNORMAL HIGH (ref 70–99)
Potassium: 4 mmol/L (ref 3.5–5.1)
Sodium: 133 mmol/L — ABNORMAL LOW (ref 135–145)
Total Bilirubin: 0.5 mg/dL (ref 0.0–1.2)
Total Protein: 11.3 g/dL — ABNORMAL HIGH (ref 6.5–8.1)

## 2023-07-10 LAB — CULTURE, BLOOD (ROUTINE X 2)

## 2023-07-10 LAB — MAGNESIUM
Magnesium: 2.3 mg/dL (ref 1.7–2.4)
Magnesium: 2.5 mg/dL — ABNORMAL HIGH (ref 1.7–2.4)

## 2023-07-10 LAB — RASBURICASE - URIC ACID: Uric Acid, Serum: 2.8 mg/dL — ABNORMAL LOW (ref 3.7–8.6)

## 2023-07-10 LAB — PHOSPHORUS: Phosphorus: 2.8 mg/dL (ref 2.5–4.6)

## 2023-07-10 MED ORDER — INSULIN ASPART 100 UNIT/ML IJ SOLN
2.0000 [IU] | INTRAMUSCULAR | Status: DC
  Administered 2023-07-10 – 2023-07-11 (×7): 2 [IU] via SUBCUTANEOUS

## 2023-07-10 MED ORDER — AMIODARONE HCL 200 MG PO TABS
200.0000 mg | ORAL_TABLET | Freq: Every day | ORAL | Status: AC
  Administered 2023-07-22 – 2023-08-04 (×14): 200 mg via NASOGASTRIC
  Filled 2023-07-10 (×14): qty 1

## 2023-07-10 MED ORDER — MIDAZOLAM HCL 2 MG/2ML IJ SOLN
2.0000 mg | INTRAMUSCULAR | Status: DC | PRN
  Administered 2023-07-10 – 2023-07-13 (×5): 2 mg via INTRAVENOUS
  Filled 2023-07-10 (×5): qty 2

## 2023-07-10 MED ORDER — SODIUM CHLORIDE 0.9 % IV SOLN: INTRAVENOUS | Status: AC | PRN

## 2023-07-10 MED ORDER — INSULIN GLARGINE-YFGN 100 UNIT/ML ~~LOC~~ SOLN
10.0000 [IU] | Freq: Every day | SUBCUTANEOUS | Status: DC
  Administered 2023-07-10 – 2023-07-14 (×5): 10 [IU] via SUBCUTANEOUS
  Filled 2023-07-10 (×5): qty 0.1

## 2023-07-10 MED ORDER — AMIODARONE HCL 200 MG PO TABS
200.0000 mg | ORAL_TABLET | Freq: Two times a day (BID) | ORAL | Status: AC
  Administered 2023-07-10 – 2023-07-21 (×24): 200 mg via NASOGASTRIC
  Filled 2023-07-10 (×24): qty 1

## 2023-07-10 NOTE — Progress Notes (Addendum)
 Rounding Note    Patient Name: Frank [name]. Date of Encounter: 07/10/2023  [facility] Cardiologist: Wendie Hamburg, MD   Subjective   No acute overnight events. He remains intubated and on CRRT. He will open his eyes this morning but unable to follow commands.   Inpatient Medications    Scheduled Meds:  famotidine   10.4 mg Per Tube Daily   feeding supplement (PROSource TF20)  60 mL Per Tube TID   insulin  aspart  0-15 Units Subcutaneous Q4H   lactulose   30 g Per Tube TID   nicotine   14 mg Transdermal Daily   mouth rinse  15 mL Mouth Rinse Q2H   pneumococcal 20-valent conjugate vaccine  0.5 mL Intramuscular Tomorrow-1000   sodium chloride  flush  10-40 mL Intracatheter Q12H   valproic  acid  250 mg Per Tube Q6H   Continuous Infusions:  amiodarone  30 mg/hr (07/10/23 0700)   dexmedetomidine  (PRECEDEX ) IV infusion 0.2 mcg/kg/hr (07/09/23 2336)   feeding supplement (VITAL 1.5 CAL) 50 mL/hr at 07/10/23 0700   fentaNYL  infusion INTRAVENOUS 150 mcg/hr (07/10/23 0700)   heparin  10,000 units/ 20 mL infusion syringe 500 Units/hr (07/10/23 0516)   norepinephrine  (LEVOPHED ) Adult infusion 13 mcg/min (07/10/23 0700)   piperacillin -tazobactam (ZOSYN )  IV 12.5 mL/hr at 07/10/23 0700   prismasol  BGK 4/2.5 400 mL/hr at 07/09/23 2308   prismasol  BGK 4/2.5 400 mL/hr at 07/09/23 2308   prismasol  BGK 4/2.5 1,500 mL/hr at 07/10/23 0656   vasopressin  0.03 Units/min (07/10/23 0700)   PRN Meds: acetaminophen  (TYLENOL ) oral liquid 160 mg/5 mL **OR** acetaminophen , fentaNYL  (SUBLIMAZE ) injection, fentaNYL  (SUBLIMAZE ) injection, haloperidol  lactate, heparin , hydrALAZINE , labetalol , LORazepam , LORazepam , mouth rinse, oxyCODONE , polyethylene glycol, sodium chloride  flush   Vital Signs    Vitals:   07/10/23 0550 07/10/23 0600 07/10/23 0630 07/10/23 0700  BP: (!) 92/51 (!) 96/54 (!) 103/55 (!) 101/57  Pulse: 89 88 88 89  Resp: 20 20 20 20   Temp:      TempSrc:      SpO2: 99%  99% 99% 99%  Weight:      Height:        Intake/Output Summary (Last 24 hours) at 07/10/2023 0718 Last data filed at 07/10/2023 0700 Gross per 24 hour  Intake 3602.46 ml  Output 5461 ml  Net -1858.54 ml      07/10/2023    4:56 AM 07/09/2023    5:00 AM 07/08/2023    5:00 AM  Last 3 Weights  Weight (lbs) 231 lb 14.8 oz 239 lb 3.2 oz 227 lb 11.8 oz  Weight (kg) 105.2 kg 108.5 kg 103.3 kg      Telemetry    Normal sinus rhythm with rates in the 80s to 90s. A couple of short runs of NSVT noted (longest run 4 beats).  - Personally Reviewed  ECG    No new ECG tracing today. - Personally Reviewed  Physical Exam   GEN: Intubated.    Neck: CVC in right internal jugular vein. Cardiac: RRR. No murmurs, rubs, or gallops.  Respiratory: Clear to auscultation anteriorly. GI: Soft and non-distended. MS: No lower extremity edema. Neuro:  Intubated. Able to open his eyes to verbal stimuli but not following commands.   Labs    High Sensitivity Troponin:  No results for input(s): "TROPONINIHS" in the last 720 hours.   Chemistry Recent Labs  Lab 07/08/23 0828 07/08/23 1800 07/09/23 0504 07/09/23 1631 07/10/23 0411  NA 145  --  146* 140 133*  133*  K 3.3*  --  4.6 3.9  3.9 4.0  4.0  CL 119*  --  118* 112* 105  104  CO2 20*  --  20* 22 22  23   GLUCOSE 182*  --  281* 299* 284*  284*  BUN 77*  --  97* 89* 62*  66*  CREATININE 4.72*  --  6.06* 5.00* 3.74*  3.64*  CALCIUM  7.9*  --  7.4* 7.0* 6.9*  6.9*  MG 2.1   < > 2.2 2.2 2.3  PROT 11.1*  --  10.4*  --  11.3*  ALBUMIN  <1.5*  --  <1.5* <1.5* 1.6*  1.7*  AST 27  --  27  --  21  ALT 16  --  25  --  38  ALKPHOS 37*  --  43  --  42  BILITOT 0.5  --  0.6  --  0.5  GFRNONAA 13*  --  10* 13* 18*  18*  ANIONGAP 6  --  8 6 6  6    < > = values in this interval not displayed.    Lipids No results for input(s): "CHOL", "TRIG", "HDL", "LABVLDL", "LDLCALC", "CHOLHDL" in the last 168 hours.  Hematology Recent Labs  Lab  07/09/23 0504 07/09/23 1631 07/10/23 0411  WBC 17.3* 12.4* 12.2*  RBC 2.59* 2.60* 2.61*  HGB 7.4* 7.3* 7.3*  HCT 23.5* 23.7* 23.8*  MCV 90.7 91.2 91.2  MCH 28.6 28.1 28.0  MCHC 31.5 30.8 30.7  RDW 20.7* 21.1* 21.1*  PLT 108* 100* 112*   Thyroid No results for input(s): "TSH", "FREET4" in the last 168 hours.  BNPNo results for input(s): "BNP", "PROBNP" in the last 168 hours.  DDimer  Recent Labs  Lab 07/07/23 2030  DDIMER 0.93*     Radiology    DG Chest 1 View Result Date: 07/09/2023 CLINICAL DATA:  Status post central line placement EXAM: PORTABLE CHEST 1 VIEW COMPARISON:  07/08/2023 FINDINGS: Endotracheal tube and feeding catheter are noted in satisfactory position. New right jugular catheter is seen in satisfactory position. No pneumothorax is noted. The overall inspiratory effort is poor with crowding of the vascular markings. IMPRESSION: Poor inspiratory effort. No pneumothorax following central line placement. Electronically Signed   By: Violeta Grey M.D.   On: 07/09/2023 11:19   CT ABDOMEN PELVIS WO CONTRAST Result Date: 07/08/2023 CLINICAL DATA:  Palpable right groin mass. Anemia requiring 3 units of packed red blood cells. Sepsis due to acquired pneumonia. Lytic bone lesions with a clinical concern for a new diagnosis of multiple myeloma. Type 2 diabetes. EXAM: CT ABDOMEN AND PELVIS WITHOUT CONTRAST TECHNIQUE: Multidetector CT imaging of the abdomen and pelvis was performed following the standard protocol without IV contrast. RADIATION DOSE REDUCTION: This exam was performed according to the departmental dose-optimization program which includes automated exposure control, adjustment of the mA and/or kV according to patient size and/or use of iterative reconstruction technique. COMPARISON:  CT-guided right iliac bone marrow aspiration and core biopsy dated 07/04/2023. FINDINGS: Lower chest: Multifocal patchy opacities in both lower lobes. Small left pleural effusion and minimal  right pleural effusion. Borderline enlarged heart. Hepatobiliary: Normal-appearing liver. Dependent calcification in the gallbladder without gallbladder wall thickening or pericholecystic fluid. Pancreas: Unremarkable. No pancreatic ductal dilatation or surrounding inflammatory changes. Spleen: Normal in size without focal abnormality. Adrenals/Urinary Tract: Foley catheter in the urinary bladder with no significant urine in the bladder. Single tiny calculus in each kidney. Small, exophytic high density left renal cyst compatible with a hemorrhagic  or proteinaceous cyst. Probable subcentimeter exophytic left renal cyst, difficult to assess due to streak artifacts produced by the patient's left arm. This could be assessed with renal ultrasound. Unremarkable adrenal glands and ureters. Stomach/Bowel: Stomach is within normal limits. Appendix appears normal. No evidence of bowel wall thickening, distention, or inflammatory changes. Vascular/Lymphatic: Atheromatous arterial calcifications without aneurysm. No enlarged lymph nodes. Reproductive: Normal-sized prostate gland containing coarse calcifications. Other: Large right buttock and groin hematoma containing a fluid/hematocrit level. This measures 36.0 x 11.4 x 9.7 cm in maximum dimensions on sagittal and coronal images 32/7 and 39/6 respectively. Musculoskeletal: Left hip prosthesis with associated streak artifacts. Nondisplaced right 10th posterolateral rib fracture. Old, healed left posterolateral 10th rib fracture. Multiple small to medium-sized lytic lesions in multiple lumbar and lower thoracic vertebral bodies and in the pelvic bones bilaterally. Moderate right hip degenerative changes. IMPRESSION: 1. Large acute right buttock and groin hematoma measuring 36.0 x 11.4 x 9.7 cm. 2. Nondisplaced right 10th posterolateral rib fracture. 3. Multiple small to medium-sized lytic lesions in multiple lumbar and lower thoracic vertebral bodies and in the pelvic bones  bilaterally, compatible with the clinical suspicion of multiple myeloma. Lytic metastases can also have this appearance. 4. Multifocal patchy opacities in both lower lobes, compatible with known pneumonia. 5. Small left pleural effusion and minimal right pleural effusion. 6. Cholelithiasis. 7. Tiny bilateral renal calculi. 8. Probable subcentimeter exophytic left renal cyst, difficult to assess due to streak artifacts produced by the patient's left arm. This could be assessed with renal ultrasound. Electronically Signed   By: Catherin Closs M.D.   On: 07/08/2023 16:07   DG CHEST PORT 1 VIEW Result Date: 07/08/2023 CLINICAL DATA:  [id number] Endotracheally intubated [id number] EXAM: PORTABLE CHEST 1 VIEW COMPARISON:  07/08/2023 FINDINGS: The endotracheal tube is positioned just to the left of midline, likely within the mid trachea, likely due to patient rotation. Mild cardiomegaly. No focal airspace consolidation, pleural effusion, or pneumothorax. No cardiomegaly. No acute fracture or destructive lesion. Moderate osteoarthritis of the right shoulder. Multilevel degenerative disc disease of the spine. Weighted feeding tube courses below the diaphragm with the distal tip not included in the field of view. IMPRESSION: 1. The endotracheal tube is positioned just to the left of midline, likely within the mid trachea, but abnormally positioned due to patient rotation. No pneumothorax. 2. Weighted feeding tube courses below the diaphragm, distal tip outside the field of view. Electronically Signed   By: Rance Burrows M.D.   On: 07/08/2023 14:02   DG CHEST PORT 1 VIEW Result Date: 07/08/2023 CLINICAL DATA:  Dyspnea. EXAM: PORTABLE CHEST 1 VIEW COMPARISON:  07/06/2023 FINDINGS: Feeding tube extends down the esophagus into the stomach. Tube appears to be coiled in the stomach but the tip is not clearly identified. Heart size is within normal limits and stable. Prominent vascular and interstitial lung markings are unchanged. No  focal airspace disease or lung consolidation. Again noted is severe joint space narrowing and probable degenerative changes in both shoulders. Trachea is midline. IMPRESSION: 1. Again noted are prominent vascular and interstitial lung markings. Findings could represent vascular congestion or mild edema. Minimal change since 07/06/2023. 2. Feeding tube extends into the abdomen. Electronically Signed   By: Elene Griffes M.D.   On: 07/08/2023 10:25    Cardiac Studies   Echocardiogram 07/07/2023: 1. Left ventricular ejection fraction, by estimation, is 65 to 70%. The  left ventricle has normal function. The left ventricle has no regional  wall motion abnormalities. Left ventricular  diastolic parameters are  indeterminate.   2. Right ventricular systolic function is normal. The right ventricular  size is normal. There is normal pulmonary artery systolic pressure. The  estimated right ventricular systolic pressure is 35.8 mmHg.   3. Left atrial size was moderately dilated.   4. The mitral valve is normal in structure. No evidence of mitral valve  regurgitation. No evidence of mitral stenosis.   5. The aortic valve is tricuspid. Aortic valve regurgitation is not  visualized. Aortic valve sclerosis/calcification is present, without any  evidence of aortic stenosis. Aortic valve mean gradient measures 8.0 mmHg.   6. Aortic dilatation noted. There is mild dilatation of the aortic root,  measuring 39 mm.   7. The inferior vena cava is dilated in size with <50% respiratory  variability, suggesting right atrial pressure of 15 mmHg.   8. The patient was in atrial fibrillation.   Patient Profile     58 y.o. male with a history of mild non-obstructive CAD on cardiac catheterization in 12/2018 at [facility], hypertension, hyperlipidemia, type 2 diabetes mellitus, asthma, obstructive sleep apnea on CPAP, GERD, BPH, seizure disorder, tobacco use, and prior alcohol abuse (quit 15 years ago). Patient initially presented  to [facility] in [place], [place]  on 06/29/2023 with altered mental status and was admitted for sepsis and acute metabolic encephalopathy secondary to community acquired pneumonia. Hospitalization was complicated by AKI, hypercalcemia, symptomatic anemia requiring 3 units of PRBCs, and lytic bone lesions concerning for multiple myeloma. He was transferred to [facility] on 07/03/2023 for further work-up and treatment of suspected multiple myeloma. Cardiology was consulted on 07/06/2023 for further evaluation of new onset atrial fibrillation.  Assessment & Plan    New Onset Atrial Fibrillation with RVR Patient presented in sinus rhythm and was noted to go into rapid atrial fibrillation on 4/20 in setting of acute illness. Echo showed LVEF of 65-70%. He was started on IV Diltiazem  and IV Amiodarone  and converted back to sinus rhythm.  - Maintaining sinus rhythm, will transition from IV to p.o. amiodarone  - CHA2DS2-VASc = 2 (HTN, DM). He was initially started on IV Heparin  but this had to be stopped due worsening anemia and right groin hematoma requiring multiple units of PRBCs. Hemoglobin 7.3 today.  Acute Hypoxic Respiratory Failure S/p intubation on 4/22. Felt to be secondary to encephalopathy and aspiration of oropharyngeal secretions through trachea during intubation. Also possible combination of volume overload in setting of renal failure and blood products. Echo showed LVEF of 65-70% with normal wall motion as well as normal RV function. - Volume status is being managed by CRRT now. - Management per PCCM.   Mild Non-Obstructive CAD LHC in 12/2018 at [facility] showed only mild disease with 25% stenosis of mid LAD and 25% stenosis of mid ramus. - Patient currently intubated so unable to answer question. However, per chart review, it does not look like there has been any complaints of chest pain.  - Home Aspirin  on hold given anemia.  - It does not look like he is on a statin at home. Can address this  as he recovers.  Hypotension History of hypertensin but patient now hypotensive requiring pressors. Per PCCM, felt to be secondary to sedation.  - Currently on Levophed  and Vasopressin . - Continue to holed home antihypertensives.  - Management per PPCM.  AKI Creatinine peaked at 6.06 on 4/23 and he was started on CRRT.  - Management per Nephrology.  Anemia Patient required 3 units of PRBCs at outside hospital for  symptomatic anemia. Hemoglobin then dropped to as low as 5.6 here on 4/22. Abdominal/ pelvic CT showed a large acute right buttock and groin hematoma measuring 36 x 11.4 x 9.7cm. S/p another 5 units of PRBCs here at [facility].  - Hemoglobin stable at 7.3 today.  - Management per primary team.  Suspect Multiple Myeloma - Oncology is following and has started chemotherapy.  Otherwise, per primary team: - Acute metabolic encephalopathy - Fever,  - Hypercalcemia - Hypoalbuminemia - Type 2 diabetes mellitus - Seizure disorder - Obstructive sleep apnea  For questions or updates, please contact [facility] Please consult [email or web address] for contact info under        Signed, Callie E Goodrich, PA-C  07/10/2023, 7:18 AM     Patient seen and examined.  Agree with above documentation.  On exam, patient is intubated and sedated, no murmurs, diminished breath sounds, no lower extremity edema.  For his A-fib, he has been maintaining sinus rhythm.  Will convert to p.o. amiodarone .  Will plan amiodarone  200 mg twice daily x 2 weeks, then decrease to 200 mg daily x 2 weeks, then discontinue.  Would not plan on long-term amiodarone .  Continue to hold anticoagulation  Wendie Hamburg, MD

## 2023-07-10 NOTE — Progress Notes (Signed)
 NAME:  Frank Giller., MRN:  [id number], DOB:  Feb 08, 1966, LOS: 7 ADMISSION DATE:  07/03/2023, CONSULTATION DATE:  07/06/23 REFERRING MD:  Dr Maury Space, CHIEF COMPLAINT:  Encephalopathy   History of Present Illness:  Asked to see patient for encephalopathy   Transferred from [facility] with 1 week of generalized weakness, altered mentation, decreased appetite Recently being worked up for multiple myeloma.  Transferred to [facility] for oncology evaluation Nephrology consulted for AKI   Background history of obesity, asthma, obstructive sleep apnea on CPAP GERD, hypertension, hyperlipidemia Has been feeling weak for the last few days according to spouse Admitted with concern for sepsis, community-acquired pneumonia, antibiotic encephalopathy Workup revealed lytic bony lesions with concern for multiple myeloma Transfused with 3 units packed red blood cells Evaluation so far-bone marrow 07/04/2023  Pertinent  Medical History   Past Medical History:  Diagnosis Date   Anxiety    Arthritis    Asthma    Bipolar disorder (HCC)    Current every day smoker    Depression    Diabetes mellitus, type II (HCC)    Dyspnea    History of kidney stones    History of pneumonia    Hyperlipidemia    Hypertension    Morbid obesity (HCC)    Sedentary lifestyle    Seizures (HCC)    last seizure 12 yrs ago, no current problem   Sleep apnea    uses CPAP nightly   Significant Hospital Events: Including procedures, antibiotic start and stop dates in addition to other pertinent events   4/19-blood cultures, MRSA PCR negative 4/19 chest x-ray with mild pulmonary congestion 4/20 atrial fibrillation with RVR 4/21 nosebleed after IR NG attempt, NG placed by PCCM in afternoon, stable transferred to [facility] 4/22 intubated; some abla requiring prbcs; ct abd/pelvis showing acute groin hematoma tracking from posterior presumed from site of bone marrow biopsy; heparin  held  Interim History / Subjective:  Really  unchanged. Little more NE still on vaso, net negative with stool and UF via CRRT.  Objective   Blood pressure 107/62, pulse 89, temperature 99.1 F (37.3 C), resp. rate 20, height 6\' 2"  (1.88 m), weight 105.2 kg, SpO2 99%.    Vent Mode: PRVC FiO2 (%):  [30 %] 30 % Set Rate:  [20 bmp] 20 bmp Vt Set:  [650 mL] 650 mL PEEP:  [5 cmH20] 5 cmH20 Plateau Pressure:  [17 cmH20-18 cmH20] 18 cmH20   Intake/Output Summary (Last 24 hours) at 07/10/2023 0803 Last data filed at 07/10/2023 0700 Gross per 24 hour  Intake 3602.46 ml  Output 5461 ml  Net -1858.54 ml   Filed Weights   07/08/23 0500 07/09/23 0500 07/10/23 0456  Weight: 103.3 kg 108.5 kg 105.2 kg    Examination: General:  critically ill appearing on mech vent HEENT: MM pink/moist; ETT in place Neuro: on low sedation; not following commands; cough/gag present; perrl CV: s1s2, no m/r/g PULM:  dim clear BS bilaterally; on mech vent PRVC GI: soft, bsx4 active  Extremities: warm/dry, no edema; right groin increased size and firm on palpation Skin: no rashes or lesions    Resolved Hospital Problem list   Hypercalcemia-resolved  Assessment & Plan:  Acute hypoxemic respiratory failure: Most likely in the setting of encephalopathy and aspiration of oropharyngeal secretions with appearance of bloody frothy secretions through trachea during intubation.  Also possible combination of volume overload in setting of renal failure and blood products, increased fluid load from chemotherapy etc. -- PRVC, VAP bundle, stress  ulcer prophylaxis -- zosyn    Multiple myeloma - Presented with encephalopathy, AKI, hypocalcemia, lytic bone lesions, Elevated immunoglobulins - Bone marrow results consistent with myeloma - Appreciate hematology/oncology assistance, started chemo 4/22 - Trend TLS labs   Hypercalcemia-resolved - Did receive IV Zometa  - Received calcitonin - s/p lasix    Paroxysmal atrial fibrillation with RVR: converted to ST 4/22 AM -  d/c cardizem  drip - Continue amio drip - stop heparin  drip with bleeding, anemia - Cardiology consulted, appreciate assistance   Acute kidney injury: Creatinine, BUN slowly rising but urine output appears to be picking up which is encouraging - Avoid nephrotoxic medications - Nephrology consult, appreciate assistance, CRRT to begin 4/23   Fever: Related to pneumonia, possible myeloma. -on antibiotics -cultures 4/19- no growth   Anemia with acute blood loss: Related to myeloma, lab checks, prior hydration with hypercalcemia, hematoma tracks from site of bone marrow biopsy down to thigh on CT. Hgb now stable.  - Monitor - Requiring serial transfusions - heparin  stopped   Acute metabolic encephalopathy - Multifactorial, sepsis, hypercalcemia, myeloma - TF started - lactulose  TID held with robust stool output   Hypotension: sedation related, hypovolemia questioned as increase pressors with UF via CRRT - MAP goal 65, NE, vaso ordered   History of obstructive sleep apnea - CPAP at night if improves   History of seizure disorder - Continue seizure precautions - Continue Depakote  (home med)   Hypoalbuminemia - Continue to monitor - TFs   Type 2 diabetes with hyperglycemia - Continue SSI, added lantus  and TF coverage 4/24   Best Practice (right click and "Reselect all SmartList Selections" daily)   Diet/type: tubefeeds DVT prophylaxis SCD Pressure ulcer(s): N/A GI prophylaxis: H2B Lines: Central line Foley:  Yes, and it is still needed Code Status:  full code Last date of multidisciplinary goals of care discussion [wife updated conformed full code 4/22]  Labs   CBC: Recent Labs  Lab 07/07/23 0339 07/07/23 1107 07/07/23 2030 07/08/23 0828 07/08/23 1800 07/09/23 0504 07/09/23 1631 07/10/23 0411  WBC 13.7*  --   --  15.7*  --  17.3* 12.4* 12.2*  NEUTROABS 4.8  --   --  7.9*  --  13.5* 8.0* 10.4*  HGB 6.3*   < >  --  5.6* 6.2* 7.4* 7.3* 7.3*  HCT 22.2*   < >  --   19.5* 21.4* 23.5* 23.7* 23.8*  MCV 94.5  --   --  97.5  --  90.7 91.2 91.2  PLT 140*  --  137* 147*  --  108* 100* 112*   < > = values in this interval not displayed.    Basic Metabolic Panel: Recent Labs  Lab 07/07/23 0339 07/08/23 0828 07/08/23 1800 07/09/23 0504 07/09/23 1631 07/10/23 0411  NA 144 145  --  146* 140 133*  133*  K 2.9* 3.3*  --  4.6 3.9  3.9 4.0  4.0  CL 117* 119*  --  118* 112* 105  104  CO2 22 20*  --  20* 22 22  23   GLUCOSE 175* 182*  --  281* 299* 284*  284*  BUN 63* 77*  --  97* 89* 62*  66*  CREATININE 4.83* 4.72*  --  6.06* 5.00* 3.74*  3.64*  CALCIUM  8.1* 7.9*  --  7.4* 7.0* 6.9*  6.9*  MG 2.0 2.1 2.1 2.2 2.2 2.3  PHOS  --   --  4.2 3.4 2.9  2.9 3.0  2.8   GFR: Estimated  Creatinine Clearance: 28.2 mL/min (A) (by C-G formula based on SCr of 3.64 mg/dL (H)). Recent Labs  Lab 07/08/23 0828 07/09/23 0504 07/09/23 1631 07/10/23 0411  PROCALCITON 0.54  --   --   --   WBC 15.7* 17.3* 12.4* 12.2*    Liver Function Tests: Recent Labs  Lab 07/06/23 0305 07/07/23 0339 07/08/23 0828 07/09/23 0504 07/09/23 1631 07/10/23 0411  AST 27 13* 27 27  --  21  ALT 13 11 16 25   --  38  ALKPHOS 30* 31* 37* 43  --  42  BILITOT 0.7 0.5 0.5 0.6  --  0.5  PROT >12.0* >12.0* 11.1* 10.4*  --  11.3*  ALBUMIN  1.5* <1.5* <1.5* <1.5* <1.5* 1.6*  1.7*   No results for input(s): "LIPASE", "AMYLASE" in the last 168 hours. Recent Labs  Lab 07/05/23 1716 07/07/23 0339 07/08/23 0828  AMMONIA 81* 70* 116*    ABG    Component Value Date/Time   PHART 7.33 (L) 07/08/2023 1252   PCO2ART 47 07/08/2023 1252   PO2ART 369 (H) 07/08/2023 1252   HCO3 24.3 07/08/2023 1252   TCO2 28 01/29/2023 1044   ACIDBASEDEF 1.5 07/08/2023 1252   O2SAT 100 07/08/2023 1252     Coagulation Profile: Recent Labs  Lab 07/05/23 2227 07/07/23 2030  INR 2.1* 1.9*    Cardiac Enzymes: No results for input(s): "CKTOTAL", "CKMB", "CKMBINDEX", "TROPONINI" in the last 168  hours.  HbA1C: Hgb A1c MFr Bld  Date/Time Value Ref Range Status  01/29/2023 02:40 PM 6.7 (H) 4.8 - 5.6 % Final    Comment:    (NOTE) Pre diabetes:          5.7%-6.4%  Diabetes:              >6.4%  Glycemic control for   <7.0% adults with diabetes   11/12/2021 11:30 AM 6.5 (H) 4.8 - 5.6 % Final    Comment:    (NOTE) Pre diabetes:          5.7%-6.4%  Diabetes:              >6.4%  Glycemic control for   <7.0% adults with diabetes     CBG: Recent Labs  Lab 07/09/23 1622 07/09/23 1954 07/09/23 2315 07/10/23 0421 07/10/23 0800  GLUCAP 252* 262* 223* 239* 284*    Review of Systems:   Unable to provide history  Past Medical History:  He,  has a past medical history of Anxiety, Arthritis, Asthma, Bipolar disorder (HCC), Current every day smoker, Depression, Diabetes mellitus, type II (HCC), Dyspnea, History of kidney stones, History of pneumonia, Hyperlipidemia, Hypertension, Morbid obesity (HCC), Sedentary lifestyle, Seizures (HCC), and Sleep apnea.   Surgical History:   Past Surgical History:  Procedure Laterality Date   CARDIAC CATHETERIZATION  2020   carpal tunel Left    COLONOSCOPY     HARDWARE REMOVAL Left 07/26/2022   Procedure: HARDWARE REMOVAL ELBOW;  Surgeon: Laneta Pintos, MD;  Location: [facility] OR;  Service: Orthopedics;  Laterality: Left;   ORIF HUMERUS FRACTURE Left 01/16/2022   Procedure: OPEN REDUCTION INTERNAL FIXATION (ORIF) DISTAL HUMERUS FRACTURE;  Surgeon: Laneta Pintos, MD;  Location: [facility] OR;  Service: Orthopedics;  Laterality: Left;   ORIF HUMERUS FRACTURE Left 01/29/2023   Procedure: OPEN REDUCTION INTERNAL FIXATION (ORIF) DISTAL HUMERUS FRACTURE;  Surgeon: Laneta Pintos, MD;  Location: [facility] OR;  Service: Orthopedics;  Laterality: Left;   OTHER SURGICAL HISTORY     R & L shoulder  OTHER SURGICAL HISTORY Right    Knee surgery x several   TOTAL HIP ARTHROPLASTY Left 12/26/2017   Procedure: LEFT TOTAL HIP ARTHROPLASTY ANTERIOR APPROACH;   Surgeon: Arnie Lao, MD;  Location: [facility];  Service: Orthopedics;  Laterality: Left;   TOTAL KNEE ARTHROPLASTY Left 11/23/2021   Procedure: LEFT TOTAL KNEE ARTHROPLASTY;  Surgeon: Arnie Lao, MD;  Location: [facility];  Service: Orthopedics;  Laterality: Left;     Social History:   reports that he has been smoking cigarettes. He has a 12.5 pack-year smoking history. He has never used smokeless tobacco. He reports current drug use. Drug: Marijuana. He reports that he does not drink alcohol.   Family History:  His family history includes Diabetes in his brother, father, mother, and sister; Heart attack in his father and mother; Hypertension in his brother, father, mother, and sister; Osteoporosis in his mother.   Allergies Allergies  Allergen Reactions   Morphine And Codeine Anaphylaxis   Shellfish Allergy Anaphylaxis   Betadine  [Povidone Iodine ] Itching   Bupropion Other (See Comments)    Shaking of the body, hallucinations    Chlorhexidine  Hives    Patient reports never had issues CHG with mouth rinse.   Influenza Vaccines Hives   Metoprolol  Rash     Home Medications  Prior to Admission medications   Medication Sig Start Date End Date Taking? Authorizing Provider  albuterol  (PROVENTIL  HFA;VENTOLIN  HFA) 108 (90 Base) MCG/ACT inhaler Inhale 2 puffs into the lungs every 6 (six) hours as needed for wheezing or shortness of breath.   Yes [provider]  albuterol  (PROVENTIL ) (2.5 MG/3ML) 0.083% nebulizer solution 2.5 mg every 2 (two) hours as needed for wheezing or shortness of breath.   Yes [provider]  aspirin  EC 81 MG tablet Take 81 mg by mouth daily. Swallow whole.   Yes [provider]  busPIRone  (BUSPAR ) 15 MG tablet Take 15 mg by mouth at bedtime.   Yes [provider]  celecoxib  (CELEBREX ) 200 MG capsule Take 200 mg by mouth daily.   Yes [provider]  clonazePAM (KLONOPIN) 1 MG tablet Take 1 mg by mouth  once as needed for anxiety.   Yes [provider]  cyclobenzaprine  (FLEXERIL ) 5 MG tablet Take 1 tablet (5 mg total) by mouth 3 (three) times daily as needed for muscle spasms. Patient taking differently: Take 5 mg by mouth daily as needed for muscle spasms. 01/30/23  Yes Versie Gores, PA-C  divalproex  (DEPAKOTE ) 500 MG DR tablet Take 500 mg by mouth 2 (two) times daily.   Yes [provider]  EPINEPHrine  0.3 mg/0.3 mL IJ SOAJ injection Inject 0.3 mg into the muscle as needed for anaphylaxis. 08/28/21  Yes [provider]  esomeprazole (NEXIUM) 40 MG capsule Take 40 mg by mouth in the morning.   Yes [provider]  Fluticasone -Salmeterol (ADVAIR) 500-50 MCG/DOSE AEPB Inhale 2 puffs into the lungs in the morning.   Yes [provider]  furosemide  (LASIX ) 40 MG tablet Take 40 mg by mouth daily as needed for fluid or edema.   Yes [provider]  gabapentin  (NEURONTIN ) 300 MG capsule Take 300-600 mg by mouth See admin instructions. 300 mg in the morning, 600 mg at bedtime   Yes [provider]  hydrALAZINE  (APRESOLINE ) 10 MG tablet Take 10 mg by mouth in the morning and at bedtime.   Yes [provider]  hydrochlorothiazide  (HYDRODIURIL ) 25 MG tablet Take 25 mg by mouth daily.  Yes [provider]  lisinopril  (ZESTRIL ) 40 MG tablet Take 40 mg by mouth daily. 03/25/23  Yes [provider]  metFORMIN  (GLUCOPHAGE ) 1000 MG tablet Take 1,000 mg by mouth 2 (two) times daily with a meal.   Yes [provider]  montelukast  (SINGULAIR ) 10 MG tablet Take 10 mg by mouth at bedtime.   Yes [provider]  OVER THE COUNTER MEDICATION Take 400 mg by mouth at bedtime. Burdock root   Yes [provider]  oxyCODONE  (ROXICODONE ) 5 MG immediate release tablet Take 1 tablet (5 mg total) by mouth every 4 (four) hours as needed for severe pain (pain score 7-10). 01/30/23  Yes Versie Gores, PA-C   PARoxetine  (PAXIL ) 40 MG tablet Take 40 mg by mouth every morning.   Yes [provider]  spironolactone  (ALDACTONE ) 25 MG tablet Take 25 mg by mouth at bedtime. 10/07/21  Yes [provider]  tamsulosin  (FLOMAX ) 0.4 MG CAPS capsule Take 0.4 mg by mouth at bedtime. 10/07/21  Yes [provider]  testosterone  cypionate (DEPOTESTOSTERONE CYPIONATE) 200 MG/ML injection Inject 0.5 mLs (100 mg total) into the muscle every 14 (fourteen) days. 09/17/16  Yes Nida, Gebreselassie W, MD  verapamil  (CALAN -SR) 120 MG CR tablet Take 120 mg by mouth daily.   Yes [provider]  Vitamin D , Ergocalciferol , (DRISDOL ) 1.25 MG (50000 UNIT) CAPS capsule Take 1 capsule (50,000 Units total) by mouth every Thursday. Patient taking differently: Take 50,000 Units by mouth once a week. Tuesday 02/06/23  Yes Versie Gores, PA-C  buPROPion (WELLBUTRIN XL) 150 MG 24 hr tablet Take 150 mg by mouth daily. Patient not taking: Reported on 07/04/2023    [provider]    CRITICAL CARE Performed by: Archer Kobs Jameal Razzano  Total critical care time: 33 minutes  Critical care time was exclusive of separately billable procedures and treating other patients.  Critical care was necessary to treat or prevent imminent or life-threatening deterioration.  Critical care was time spent personally by me on the following activities: development of treatment plan with patient and/or surrogate as well as nursing, discussions with consultants, evaluation of patient's response to treatment, examination of patient, obtaining history from patient or surrogate, ordering and performing treatments and interventions, ordering and review of laboratory studies, ordering and review of radiographic studies, pulse oximetry and re-evaluation of patient's condition.   Guerry Leek, MD [facility] 07/10/2023, 8:03 AM  Please see [email or web address] for pager details.  From 7A-7P if no  response, please call [phone]. After hours, please call ELink [phone].

## 2023-07-10 NOTE — Progress Notes (Signed)
 [facility] NEPHROLOGY PROGRESS NOTE  Assessment/ Plan: Pt is a 58 y.o. yo male with past medical history significant for HTN, HLD, type II DM, BPH, seizure who was initially presented at [facility] due to AMS, admitted for sepsis due to pneumonia, encephalopathy, sepsis, AKI, hypercalcemia symptomatic anemia and Lytic pulm lesion concern for multiple myeloma.  # AKI on CKD 3a - b/l creat 1.1- 1.5 from 2024, eGFR 53- >60 ml/min. Creat here 5.8 on presentation at OSH and last several days in the 4.0- 4.8 range. AKI due to possible myeloma kidney +/- hyperCa +/- ACEi (home med).  Initially treated with IV fluid and then required Lasix  because of fluid overload.   Started CRRT on 4/23, all 4K bath. UF 50 to 100 cc an hour, no systemic heparin  because of bleeding and hematoma however he will need limited heparin  in the circuit. Continue with strict ins and out and close lab monitoring.  # Multiple myeloma: Oncologist started Velcade  and Cytoxan .  Management per oncology team.    # Shock-currently on Levophed  and vasopressin .  Monitor BP.  #Volume -required IV diuretics.   now UF with CRRT.  # Atrial fib-  on IV amio and IV diltiazem .  #Hypercalcemia -  resolved  now. Possible myeloma, w/u in progress, oncology following. SP zometa  here and calcitonin.   #Multifocal PNA - on IV abx  #Anemia -getting PRBC transfusion.  # AMS/acute metabolic encephalopathy.  Concerned about hyperviscosity.  Oncology team and PCCM following.  Started CRRT for azotemia.  # Hyperuricemia: Rasburicase  and dialysis.  Discussed with ICU provider.  Subjective: Seen and examined in ICU.  He is intubated, sedated.  On Levophed  and vasopressin .  Urine output is only 300 cc.  No other new event.  CRRT running well.   Objective Vital signs in last 24 hours: Vitals:   07/10/23 0700 07/10/23 0715 07/10/23 0730 07/10/23 0745  BP: (!) 101/57 (!) 100/57 (!) 99/55 (!) 103/56  Pulse: 89 90 90 90   Resp: 20 20 20 20   Temp:      TempSrc:      SpO2: 99% 98% 99% 98%  Weight:      Height:       Weight change: -3.3 kg  Intake/Output Summary (Last 24 hours) at 07/10/2023 0753 Last data filed at 07/10/2023 0700 Gross per 24 hour  Intake 3602.46 ml  Output 5461 ml  Net -1858.54 ml       Labs: RENAL PANEL Recent Labs  Lab 07/07/23 0339 07/08/23 0828 07/08/23 1800 07/09/23 0504 07/09/23 1631 07/10/23 0411  NA 144 145  --  146* 140 133*  133*  K 2.9* 3.3*  --  4.6 3.9  3.9 4.0  4.0  CL 117* 119*  --  118* 112* 105  104  CO2 22 20*  --  20* 22 22  23   GLUCOSE 175* 182*  --  281* 299* 284*  284*  BUN 63* 77*  --  97* 89* 62*  66*  CREATININE 4.83* 4.72*  --  6.06* 5.00* 3.74*  3.64*  CALCIUM  8.1* 7.9*  --  7.4* 7.0* 6.9*  6.9*  MG 2.0 2.1 2.1 2.2 2.2 2.3  PHOS  --   --  4.2 3.4 2.9  2.9 3.0  2.8  ALBUMIN  <1.5* <1.5*  --  <1.5* <1.5* 1.6*  1.7*    Liver Function Tests: Recent Labs  Lab 07/08/23 0828 07/09/23 0504 07/09/23 1631 07/10/23 0411  AST 27 27  --  21  ALT 16 25  --  38  ALKPHOS 37* 43  --  42  BILITOT 0.5 0.6  --  0.5  PROT 11.1* 10.4*  --  11.3*  ALBUMIN  <1.5* <1.5* <1.5* 1.6*  1.7*   No results for input(s): "LIPASE", "AMYLASE" in the last 168 hours. Recent Labs  Lab 07/05/23 1716 07/07/23 0339 07/08/23 0828  AMMONIA 81* 70* 116*   CBC: Recent Labs    07/08/23 0828 07/08/23 1800 07/09/23 0504 07/09/23 1631 07/10/23 0411  HGB 5.6* 6.2* 7.4* 7.3* 7.3*  MCV 97.5  --  90.7 91.2 91.2    Cardiac Enzymes: No results for input(s): "CKTOTAL", "CKMB", "CKMBINDEX", "TROPONINI" in the last 168 hours. CBG: Recent Labs  Lab 07/09/23 1209 07/09/23 1622 07/09/23 1954 07/09/23 2315 07/10/23 0421  GLUCAP 215* 252* 262* 223* 239*    Iron Studies: No results for input(s): "IRON", "TIBC", "TRANSFERRIN", "FERRITIN" in the last 72 hours. Studies/Results: DG Chest 1 View Result Date: 07/09/2023 CLINICAL DATA:  Status post central  line placement EXAM: PORTABLE CHEST 1 VIEW COMPARISON:  07/08/2023 FINDINGS: Endotracheal tube and feeding catheter are noted in satisfactory position. New right jugular catheter is seen in satisfactory position. No pneumothorax is noted. The overall inspiratory effort is poor with crowding of the vascular markings. IMPRESSION: Poor inspiratory effort. No pneumothorax following central line placement. Electronically Signed   By: Violeta Grey M.D.   On: 07/09/2023 11:19   CT ABDOMEN PELVIS WO CONTRAST Result Date: 07/08/2023 CLINICAL DATA:  Palpable right groin mass. Anemia requiring 3 units of packed red blood cells. Sepsis due to acquired pneumonia. Lytic bone lesions with a clinical concern for a new diagnosis of multiple myeloma. Type 2 diabetes. EXAM: CT ABDOMEN AND PELVIS WITHOUT CONTRAST TECHNIQUE: Multidetector CT imaging of the abdomen and pelvis was performed following the standard protocol without IV contrast. RADIATION DOSE REDUCTION: This exam was performed according to the departmental dose-optimization program which includes automated exposure control, adjustment of the mA and/or kV according to patient size and/or use of iterative reconstruction technique. COMPARISON:  CT-guided right iliac bone marrow aspiration and core biopsy dated 07/04/2023. FINDINGS: Lower chest: Multifocal patchy opacities in both lower lobes. Small left pleural effusion and minimal right pleural effusion. Borderline enlarged heart. Hepatobiliary: Normal-appearing liver. Dependent calcification in the gallbladder without gallbladder wall thickening or pericholecystic fluid. Pancreas: Unremarkable. No pancreatic ductal dilatation or surrounding inflammatory changes. Spleen: Normal in size without focal abnormality. Adrenals/Urinary Tract: Foley catheter in the urinary bladder with no significant urine in the bladder. Single tiny calculus in each kidney. Small, exophytic high density left renal cyst compatible with a  hemorrhagic or proteinaceous cyst. Probable subcentimeter exophytic left renal cyst, difficult to assess due to streak artifacts produced by the patient's left arm. This could be assessed with renal ultrasound. Unremarkable adrenal glands and ureters. Stomach/Bowel: Stomach is within normal limits. Appendix appears normal. No evidence of bowel wall thickening, distention, or inflammatory changes. Vascular/Lymphatic: Atheromatous arterial calcifications without aneurysm. No enlarged lymph nodes. Reproductive: Normal-sized prostate gland containing coarse calcifications. Other: Large right buttock and groin hematoma containing a fluid/hematocrit level. This measures 36.0 x 11.4 x 9.7 cm in maximum dimensions on sagittal and coronal images 32/7 and 39/6 respectively. Musculoskeletal: Left hip prosthesis with associated streak artifacts. Nondisplaced right 10th posterolateral rib fracture. Old, healed left posterolateral 10th rib fracture. Multiple small to medium-sized lytic lesions in multiple lumbar and lower thoracic vertebral bodies and in the pelvic bones bilaterally. Moderate right hip degenerative changes.  IMPRESSION: 1. Large acute right buttock and groin hematoma measuring 36.0 x 11.4 x 9.7 cm. 2. Nondisplaced right 10th posterolateral rib fracture. 3. Multiple small to medium-sized lytic lesions in multiple lumbar and lower thoracic vertebral bodies and in the pelvic bones bilaterally, compatible with the clinical suspicion of multiple myeloma. Lytic metastases can also have this appearance. 4. Multifocal patchy opacities in both lower lobes, compatible with known pneumonia. 5. Small left pleural effusion and minimal right pleural effusion. 6. Cholelithiasis. 7. Tiny bilateral renal calculi. 8. Probable subcentimeter exophytic left renal cyst, difficult to assess due to streak artifacts produced by the patient's left arm. This could be assessed with renal ultrasound. Electronically Signed   By: Catherin Closs  M.D.   On: 07/08/2023 16:07   DG CHEST PORT 1 VIEW Result Date: 07/08/2023 CLINICAL DATA:  [id number] Endotracheally intubated [id number] EXAM: PORTABLE CHEST 1 VIEW COMPARISON:  07/08/2023 FINDINGS: The endotracheal tube is positioned just to the left of midline, likely within the mid trachea, likely due to patient rotation. Mild cardiomegaly. No focal airspace consolidation, pleural effusion, or pneumothorax. No cardiomegaly. No acute fracture or destructive lesion. Moderate osteoarthritis of the right shoulder. Multilevel degenerative disc disease of the spine. Weighted feeding tube courses below the diaphragm with the distal tip not included in the field of view. IMPRESSION: 1. The endotracheal tube is positioned just to the left of midline, likely within the mid trachea, but abnormally positioned due to patient rotation. No pneumothorax. 2. Weighted feeding tube courses below the diaphragm, distal tip outside the field of view. Electronically Signed   By: Rance Burrows M.D.   On: 07/08/2023 14:02   DG CHEST PORT 1 VIEW Result Date: 07/08/2023 CLINICAL DATA:  Dyspnea. EXAM: PORTABLE CHEST 1 VIEW COMPARISON:  07/06/2023 FINDINGS: Feeding tube extends down the esophagus into the stomach. Tube appears to be coiled in the stomach but the tip is not clearly identified. Heart size is within normal limits and stable. Prominent vascular and interstitial lung markings are unchanged. No focal airspace disease or lung consolidation. Again noted is severe joint space narrowing and probable degenerative changes in both shoulders. Trachea is midline. IMPRESSION: 1. Again noted are prominent vascular and interstitial lung markings. Findings could represent vascular congestion or mild edema. Minimal change since 07/06/2023. 2. Feeding tube extends into the abdomen. Electronically Signed   By: Elene Griffes M.D.   On: 07/08/2023 10:25    Medications: Infusions:  amiodarone  30 mg/hr (07/10/23 0700)   dexmedetomidine   (PRECEDEX ) IV infusion 0.2 mcg/kg/hr (07/09/23 2336)   feeding supplement (VITAL 1.5 CAL) 50 mL/hr at 07/10/23 0700   fentaNYL  infusion INTRAVENOUS 150 mcg/hr (07/10/23 0700)   heparin  10,000 units/ 20 mL infusion syringe 500 Units/hr (07/10/23 0516)   norepinephrine  (LEVOPHED ) Adult infusion 13 mcg/min (07/10/23 0700)   piperacillin -tazobactam (ZOSYN )  IV 12.5 mL/hr at 07/10/23 0700   prismasol  BGK 4/2.5 400 mL/hr at 07/09/23 2308   prismasol  BGK 4/2.5 400 mL/hr at 07/09/23 2308   prismasol  BGK 4/2.5 1,500 mL/hr at 07/10/23 0656   vasopressin  0.03 Units/min (07/10/23 0700)    Scheduled Medications:  famotidine   10.4 mg Per Tube Daily   feeding supplement (PROSource TF20)  60 mL Per Tube TID   insulin  aspart  0-15 Units Subcutaneous Q4H   lactulose   30 g Per Tube TID   nicotine   14 mg Transdermal Daily   mouth rinse  15 mL Mouth Rinse Q2H   pneumococcal 20-valent conjugate vaccine  0.5 mL Intramuscular Tomorrow-1000  sodium chloride  flush  10-40 mL Intracatheter Q12H   valproic  acid  250 mg Per Tube Q6H    have reviewed scheduled and prn medications.  Physical Exam: General: Critically ill looking male, intubated, sedated Heart:RRR, s1s2 nl Lungs: Coarse breath sound bilateral Abdomen:soft, Non-tender, non-distended Extremities:No edema Neurology: Sedated  Frank [name] Prasad Frank [name] 07/10/2023,7:53 AM  LOS: 7 days

## 2023-07-10 NOTE — Plan of Care (Signed)
  Problem: Clinical Measurements: Goal: Diagnostic test results will improve Outcome: Not Progressing Goal: Respiratory complications will improve Outcome: Not Progressing Goal: Cardiovascular complication will be avoided Outcome: Not Progressing   Problem: Coping: Goal: Level of anxiety will decrease Outcome: Not Progressing   Problem: Elimination: Goal: Will not experience complications related to bowel motility Outcome: Not Progressing   Problem: Education: Goal: Ability to describe self-care measures that may prevent or decrease complications (Diabetes Survival Skills Education) will improve Outcome: Not Progressing

## 2023-07-10 NOTE — Progress Notes (Signed)
 Unfortunately, he is now is on dialysis.  I guess yesterday, his BUN is 89 creatinine 5.0.  He started dialysis yesterday.  His serum viscosity was only 3.6.  If I got the results back.  In touch, hyperviscosity really should not be an issue for us .  I think has been transfused quite a bit.  He had a large bleed into the buttock area.  I do think that the chemotherapy is working already.  His total protein is 11.3.  His albumin  is up to 1.7.  His uric acid with 2.8.  His white cell count is 12.2.  Hemoglobin 7.3.  Platelet count 112,000.  He is still on pressor support.  He is still intubated.  Hopefully, he will be able to come off the vent at some point in the near future.  I am glad to see that his myeloma levels are trending in the right direction right now.  I know it is still incredibly early on to know how things will go for Mr. Dougal.  I know this is incredibly complicated.  He is getting outstanding care from everybody in the ICU.  I know this is very intensive.   His vital signs show a temperature of 99.1.  Pulse 88.  Blood pressure 96/54.  Overall, his weight no change in his physical exam.  He is back in sinus rhythm which is nice to see.  Again, his next chemotherapy should be on Saturday for Velcade .  However, if we need to give this tomorrow I am sure that we can.  This really is not myelosuppressive.  Will continue to follow his protein levels.  Hopefully the dialysis is only temporary.    [name], MD  Psalm 23:4

## 2023-07-11 ENCOUNTER — Other Ambulatory Visit: Payer: Self-pay

## 2023-07-11 ENCOUNTER — Encounter (HOSPITAL_COMMUNITY): Payer: Self-pay | Admitting: Hematology & Oncology

## 2023-07-11 ENCOUNTER — Inpatient Hospital Stay (HOSPITAL_COMMUNITY)
Admission: EM | Admit: 2023-07-11 | Discharge: 2023-07-11 | Disposition: A | Discharge status: Home / Self Care | Source: Other Acute Inpatient Hospital | Attending: Physician Assistant | Admitting: Physician Assistant

## 2023-07-11 DIAGNOSIS — R569 Unspecified convulsions: Secondary | ICD-10-CM

## 2023-07-11 DIAGNOSIS — I480 Paroxysmal atrial fibrillation: Secondary | ICD-10-CM | POA: Diagnosis not present

## 2023-07-11 DIAGNOSIS — R4182 Altered mental status, unspecified: Secondary | ICD-10-CM | POA: Diagnosis not present

## 2023-07-11 DIAGNOSIS — C9000 Multiple myeloma not having achieved remission: Secondary | ICD-10-CM | POA: Diagnosis not present

## 2023-07-11 DIAGNOSIS — R531 Weakness: Secondary | ICD-10-CM | POA: Diagnosis not present

## 2023-07-11 DIAGNOSIS — G9341 Metabolic encephalopathy: Secondary | ICD-10-CM | POA: Diagnosis not present

## 2023-07-11 DIAGNOSIS — E119 Type 2 diabetes mellitus without complications: Secondary | ICD-10-CM

## 2023-07-11 DIAGNOSIS — J9601 Acute respiratory failure with hypoxia: Secondary | ICD-10-CM | POA: Diagnosis not present

## 2023-07-11 LAB — COMPREHENSIVE METABOLIC PANEL WITH GFR
ALT: 43 U/L (ref 0–44)
AST: 25 U/L (ref 15–41)
Albumin: 1.8 g/dL — ABNORMAL LOW (ref 3.5–5.0)
Alkaline Phosphatase: 42 U/L (ref 38–126)
Anion gap: 7 (ref 5–15)
BUN: 49 mg/dL — ABNORMAL HIGH (ref 6–20)
CO2: 23 mmol/L (ref 22–32)
Calcium: 6.9 mg/dL — ABNORMAL LOW (ref 8.9–10.3)
Chloride: 99 mmol/L (ref 98–111)
Creatinine, Ser: 2.79 mg/dL — ABNORMAL HIGH (ref 0.61–1.24)
GFR, Estimated: 25 mL/min — ABNORMAL LOW (ref >60)
Glucose, Bld: 301 mg/dL — ABNORMAL HIGH (ref 70–99)
Potassium: 4.1 mmol/L (ref 3.5–5.1)
Sodium: 129 mmol/L — ABNORMAL LOW (ref 135–145)
Total Bilirubin: 0.4 mg/dL (ref 0.0–1.2)
Total Protein: 11.8 g/dL — ABNORMAL HIGH (ref 6.5–8.1)

## 2023-07-11 LAB — CBC WITH DIFFERENTIAL/PLATELET
Abs Immature Granulocytes: 0 10*3/uL (ref 0.00–0.07)
Basophils Absolute: 0 10*3/uL (ref 0.0–0.1)
Basophils Relative: 0 %
Eosinophils Absolute: 0.3 10*3/uL (ref 0.0–0.5)
Eosinophils Relative: 4 %
HCT: 24.3 % — ABNORMAL LOW (ref 39.0–52.0)
Hemoglobin: 7.3 g/dL — ABNORMAL LOW (ref 13.0–17.0)
Lymphocytes Relative: 17 %
Lymphs Abs: 1.5 10*3/uL (ref 0.7–4.0)
MCH: 28.3 pg (ref 26.0–34.0)
MCHC: 30 g/dL (ref 30.0–36.0)
MCV: 94.2 fL (ref 80.0–100.0)
Monocytes Absolute: 0.3 10*3/uL (ref 0.1–1.0)
Monocytes Relative: 4 %
Neutro Abs: 6.5 10*3/uL (ref 1.7–7.7)
Neutrophils Relative %: 75 %
Platelets: 115 10*3/uL — ABNORMAL LOW (ref 150–400)
RBC: 2.58 MIL/uL — ABNORMAL LOW (ref 4.22–5.81)
RDW: 19.9 % — ABNORMAL HIGH (ref 11.5–15.5)
WBC: 8.6 10*3/uL (ref 4.0–10.5)
nRBC: 3.5 % — ABNORMAL HIGH (ref 0.0–0.2)

## 2023-07-11 LAB — RENAL FUNCTION PANEL
Albumin: 1.9 g/dL — ABNORMAL LOW (ref 3.5–5.0)
Albumin: 2 g/dL — ABNORMAL LOW (ref 3.5–5.0)
Anion gap: 6 (ref 5–15)
Anion gap: 7 (ref 5–15)
BUN: 51 mg/dL — ABNORMAL HIGH (ref 6–20)
BUN: 51 mg/dL — ABNORMAL HIGH (ref 6–20)
CO2: 24 mmol/L (ref 22–32)
CO2: 22 mmol/L (ref 22–32)
Calcium: 7 mg/dL — ABNORMAL LOW (ref 8.9–10.3)
Calcium: 7 mg/dL — ABNORMAL LOW (ref 8.9–10.3)
Chloride: 98 mmol/L (ref 98–111)
Chloride: 99 mmol/L (ref 98–111)
Creatinine, Ser: 2.88 mg/dL — ABNORMAL HIGH (ref 0.61–1.24)
Creatinine, Ser: 2.66 mg/dL — ABNORMAL HIGH (ref 0.61–1.24)
GFR, Estimated: 24 mL/min — ABNORMAL LOW (ref >60)
GFR, Estimated: 27 mL/min — ABNORMAL LOW (ref >60)
Glucose, Bld: 323 mg/dL — ABNORMAL HIGH (ref 70–99)
Glucose, Bld: 310 mg/dL — ABNORMAL HIGH (ref 70–99)
Phosphorus: 2.1 mg/dL — ABNORMAL LOW (ref 2.5–4.6)
Potassium: 4.3 mmol/L (ref 3.5–5.1)
Potassium: 4.4 mmol/L (ref 3.5–5.1)
Sodium: 128 mmol/L — ABNORMAL LOW (ref 135–145)
Sodium: 128 mmol/L — ABNORMAL LOW (ref 135–145)

## 2023-07-11 LAB — CULTURE, RESPIRATORY W GRAM STAIN

## 2023-07-11 LAB — GLUCOSE, CAPILLARY
Glucose-Capillary: 254 mg/dL — ABNORMAL HIGH (ref 70–99)
Glucose-Capillary: 268 mg/dL — ABNORMAL HIGH (ref 70–99)
Glucose-Capillary: 270 mg/dL — ABNORMAL HIGH (ref 70–99)
Glucose-Capillary: 275 mg/dL — ABNORMAL HIGH (ref 70–99)
Glucose-Capillary: 281 mg/dL — ABNORMAL HIGH (ref 70–99)
Glucose-Capillary: 296 mg/dL — ABNORMAL HIGH (ref 70–99)

## 2023-07-11 LAB — APTT: aPTT: 27 s (ref 24–36)

## 2023-07-11 LAB — AMMONIA: Ammonia: 109 umol/L — ABNORMAL HIGH (ref 9–35)

## 2023-07-11 LAB — RASBURICASE - URIC ACID: Uric Acid, Serum: <1.5 mg/dL — ABNORMAL LOW (ref 3.7–8.6)

## 2023-07-11 MED ORDER — INSULIN ASPART 100 UNIT/ML IJ SOLN
0.0000 [IU] | INTRAMUSCULAR | Status: DC
  Administered 2023-07-11 (×3): 11 [IU] via SUBCUTANEOUS
  Administered 2023-07-12: 15 [IU] via SUBCUTANEOUS
  Administered 2023-07-12: 11 [IU] via SUBCUTANEOUS
  Administered 2023-07-12: 15 [IU] via SUBCUTANEOUS
  Administered 2023-07-12 (×2): 11 [IU] via SUBCUTANEOUS
  Administered 2023-07-12: 7 [IU] via SUBCUTANEOUS
  Administered 2023-07-12: 4 [IU] via SUBCUTANEOUS
  Administered 2023-07-13 (×2): 7 [IU] via SUBCUTANEOUS
  Administered 2023-07-13 (×2): 15 [IU] via SUBCUTANEOUS
  Administered 2023-07-13: 11 [IU] via SUBCUTANEOUS
  Administered 2023-07-13: 15 [IU] via SUBCUTANEOUS
  Administered 2023-07-14 (×2): 11 [IU] via SUBCUTANEOUS
  Administered 2023-07-14 – 2023-07-15 (×5): 7 [IU] via SUBCUTANEOUS
  Administered 2023-07-15 (×2): 11 [IU] via SUBCUTANEOUS
  Administered 2023-07-15: 4 [IU] via SUBCUTANEOUS
  Administered 2023-07-15: 11 [IU] via SUBCUTANEOUS
  Administered 2023-07-16: 4 [IU] via SUBCUTANEOUS
  Administered 2023-07-16 (×2): 7 [IU] via SUBCUTANEOUS
  Administered 2023-07-16 – 2023-07-17 (×4): 4 [IU] via SUBCUTANEOUS
  Administered 2023-07-17: 3 [IU] via SUBCUTANEOUS
  Administered 2023-07-17: 4 [IU] via SUBCUTANEOUS
  Administered 2023-07-17: 7 [IU] via SUBCUTANEOUS
  Administered 2023-07-18: 4 [IU] via SUBCUTANEOUS
  Administered 2023-07-18: 7 [IU] via SUBCUTANEOUS
  Administered 2023-07-18: 4 [IU] via SUBCUTANEOUS
  Administered 2023-07-18: 7 [IU] via SUBCUTANEOUS
  Administered 2023-07-18 (×2): 4 [IU] via SUBCUTANEOUS
  Administered 2023-07-19: 7 [IU] via SUBCUTANEOUS
  Administered 2023-07-19: 4 [IU] via SUBCUTANEOUS
  Administered 2023-07-19: 3 [IU] via SUBCUTANEOUS
  Administered 2023-07-19 – 2023-07-20 (×4): 4 [IU] via SUBCUTANEOUS
  Administered 2023-07-20: 7 [IU] via SUBCUTANEOUS
  Administered 2023-07-20: 4 [IU] via SUBCUTANEOUS
  Administered 2023-07-20 (×2): 3 [IU] via SUBCUTANEOUS
  Administered 2023-07-21 (×3): 4 [IU] via SUBCUTANEOUS
  Administered 2023-07-21: 0 [IU] via SUBCUTANEOUS
  Administered 2023-07-21: 3 [IU] via SUBCUTANEOUS
  Administered 2023-07-21: 4 [IU] via SUBCUTANEOUS
  Administered 2023-07-22 – 2023-07-23 (×4): 3 [IU] via SUBCUTANEOUS
  Administered 2023-07-23 – 2023-07-24 (×2): 4 [IU] via SUBCUTANEOUS
  Administered 2023-07-24 (×2): 3 [IU] via SUBCUTANEOUS
  Administered 2023-07-25 (×2): 4 [IU] via SUBCUTANEOUS
  Administered 2023-07-25 (×2): 3 [IU] via SUBCUTANEOUS
  Administered 2023-07-25 (×2): 4 [IU] via SUBCUTANEOUS
  Administered 2023-07-26 (×4): 3 [IU] via SUBCUTANEOUS
  Administered 2023-07-26: 4 [IU] via SUBCUTANEOUS
  Administered 2023-07-26: 3 [IU] via SUBCUTANEOUS
  Administered 2023-07-27 (×2): 4 [IU] via SUBCUTANEOUS
  Administered 2023-07-27 (×2): 3 [IU] via SUBCUTANEOUS
  Administered 2023-07-27: 4 [IU] via SUBCUTANEOUS
  Administered 2023-07-31 (×5): 3 [IU] via SUBCUTANEOUS
  Administered 2023-08-01: 4 [IU] via SUBCUTANEOUS
  Administered 2023-08-01 – 2023-08-02 (×3): 3 [IU] via SUBCUTANEOUS
  Administered 2023-08-02: 4 [IU] via SUBCUTANEOUS
  Administered 2023-08-02 (×3): 3 [IU] via SUBCUTANEOUS
  Administered 2023-08-03 (×2): 4 [IU] via SUBCUTANEOUS
  Administered 2023-08-03: 3 [IU] via SUBCUTANEOUS
  Administered 2023-08-04 (×5): 4 [IU] via SUBCUTANEOUS
  Administered 2023-08-04: 3 [IU] via SUBCUTANEOUS
  Administered 2023-08-05: 4 [IU] via SUBCUTANEOUS
  Administered 2023-08-05: 3 [IU] via SUBCUTANEOUS
  Administered 2023-08-05 (×4): 4 [IU] via SUBCUTANEOUS
  Administered 2023-08-05 – 2023-08-06 (×3): 7 [IU] via SUBCUTANEOUS
  Administered 2023-08-06 (×4): 4 [IU] via SUBCUTANEOUS
  Administered 2023-08-07: 7 [IU] via SUBCUTANEOUS
  Administered 2023-08-07: 4 [IU] via SUBCUTANEOUS
  Administered 2023-08-07: 7 [IU] via SUBCUTANEOUS
  Administered 2023-08-07 (×2): 4 [IU] via SUBCUTANEOUS
  Administered 2023-08-08: 11 [IU] via SUBCUTANEOUS
  Administered 2023-08-08: 4 [IU] via SUBCUTANEOUS
  Administered 2023-08-08: 7 [IU] via SUBCUTANEOUS
  Administered 2023-08-08: 4 [IU] via SUBCUTANEOUS
  Administered 2023-08-08 – 2023-08-09 (×5): 7 [IU] via SUBCUTANEOUS
  Administered 2023-08-09: 4 [IU] via SUBCUTANEOUS
  Administered 2023-08-10 (×3): 7 [IU] via SUBCUTANEOUS
  Administered 2023-08-10: 4 [IU] via SUBCUTANEOUS
  Administered 2023-08-10: 3 [IU] via SUBCUTANEOUS
  Administered 2023-08-10: 7 [IU] via SUBCUTANEOUS
  Administered 2023-08-11 (×2): 4 [IU] via SUBCUTANEOUS
  Administered 2023-08-11: 7 [IU] via SUBCUTANEOUS
  Administered 2023-08-11: 4 [IU] via SUBCUTANEOUS
  Administered 2023-08-11 – 2023-08-12 (×3): 7 [IU] via SUBCUTANEOUS
  Administered 2023-08-12 (×3): 4 [IU] via SUBCUTANEOUS
  Administered 2023-08-12: 11 [IU] via SUBCUTANEOUS
  Administered 2023-08-12: 4 [IU] via SUBCUTANEOUS
  Administered 2023-08-13: 11 [IU] via SUBCUTANEOUS
  Administered 2023-08-13: 7 [IU] via SUBCUTANEOUS
  Administered 2023-08-13 – 2023-08-14 (×8): 4 [IU] via SUBCUTANEOUS
  Administered 2023-08-14: 7 [IU] via SUBCUTANEOUS
  Administered 2023-08-15: 4 [IU] via SUBCUTANEOUS
  Administered 2023-08-15: 3 [IU] via SUBCUTANEOUS
  Administered 2023-08-15: 15 [IU] via SUBCUTANEOUS
  Administered 2023-08-15: 4 [IU] via SUBCUTANEOUS
  Administered 2023-08-15: 15 [IU] via SUBCUTANEOUS
  Administered 2023-08-16: 4 [IU] via SUBCUTANEOUS
  Administered 2023-08-16: 3 [IU] via SUBCUTANEOUS

## 2023-07-11 MED ORDER — LACTULOSE 10 GM/15ML PO SOLN
30.0000 g | Freq: Three times a day (TID) | ORAL | Status: DC
  Administered 2023-07-11 – 2023-07-17 (×17): 30 g
  Filled 2023-07-11 (×18): qty 45

## 2023-07-11 MED ORDER — INSULIN ASPART 100 UNIT/ML IJ SOLN
5.0000 [IU] | INTRAMUSCULAR | Status: DC
  Administered 2023-07-11 – 2023-07-15 (×24): 5 [IU] via SUBCUTANEOUS

## 2023-07-11 MED ORDER — BORTEZOMIB CHEMO SQ INJECTION 3.5 MG (2.5MG/ML)
1.3000 mg/m2 | Freq: Once | INTRAMUSCULAR | Status: AC
  Administered 2023-07-11: 3 mg via SUBCUTANEOUS
  Filled 2023-07-11: qty 1.2

## 2023-07-11 NOTE — Progress Notes (Signed)
 NAME:  Frank Debord., MRN:  [id number], DOB:  1965/09/30, LOS: 8 ADMISSION DATE:  07/03/2023, CONSULTATION DATE:  07/06/23 REFERRING MD:  Dr Maury Space, CHIEF COMPLAINT:  Encephalopathy   History of Present Illness:  Asked to see patient for encephalopathy   Transferred from [facility] with 1 week of generalized weakness, altered mentation, decreased appetite Recently being worked up for multiple myeloma.  Transferred to [facility] for oncology evaluation Nephrology consulted for AKI   Background history of obesity, asthma, obstructive sleep apnea on CPAP GERD, hypertension, hyperlipidemia Has been feeling weak for the last few days according to spouse Admitted with concern for sepsis, community-acquired pneumonia, antibiotic encephalopathy Workup revealed lytic bony lesions with concern for multiple myeloma Transfused with 3 units packed red blood cells Evaluation so far-bone marrow 07/04/2023  Pertinent  Medical History   Past Medical History:  Diagnosis Date   Anxiety    Arthritis    Asthma    Bipolar disorder (HCC)    Current every day smoker    Depression    Diabetes mellitus, type II (HCC)    Dyspnea    History of kidney stones    History of pneumonia    Hyperlipidemia    Hypertension    Morbid obesity (HCC)    Sedentary lifestyle    Seizures (HCC)    last seizure 12 yrs ago, no current problem   Sleep apnea    uses CPAP nightly   Significant Hospital Events: Including procedures, antibiotic start and stop dates in addition to other pertinent events   4/19-blood cultures, MRSA PCR negative 4/19 chest x-ray with mild pulmonary congestion 4/20 atrial fibrillation with RVR 4/21 nosebleed after IR NG attempt, NG placed by PCCM in afternoon, stable transferred to [facility] 4/22 intubated; some abla requiring prbcs; ct abd/pelvis showing acute groin hematoma tracking from posterior presumed from site of bone marrow biopsy; heparin  held  Interim History / Subjective:  ON  CRRT Remains altered on some sedation On vent  Objective   Blood pressure (!) 112/59, pulse 91, temperature 100.2 F (37.9 C), temperature source Axillary, resp. rate 20, height 6\' 2"  (1.88 m), weight 97.9 kg, SpO2 99%.    Vent Mode: PRVC FiO2 (%):  [30 %] 30 % Set Rate:  [20 bmp] 20 bmp Vt Set:  [650 mL] 650 mL PEEP:  [5 cmH20] 5 cmH20 Plateau Pressure:  [18 cmH20-24 cmH20] 24 cmH20   Intake/Output Summary (Last 24 hours) at 07/11/2023 0857 Last data filed at 07/11/2023 2536 Gross per 24 hour  Intake 3295.54 ml  Output 5260.7 ml  Net -1965.16 ml   Filed Weights   07/09/23 0500 07/10/23 0456 07/11/23 0500  Weight: 108.5 kg 105.2 kg 97.9 kg    Examination: General:  critically ill appearing on mech vent HEENT: MM pink/moist; ETT in place Neuro: on low sedation; not following commands; cough/gag present; perrl CV: s1s2, no m/r/g PULM:  dim clear BS bilaterally; on mech vent PRVC GI: soft, bsx4 active  Extremities: warm/dry, no edema; right groin with hematoma firm on palpation Skin: no rashes or lesions    Resolved Hospital Problem list   Hypercalcemia-resolved  Assessment & Plan:  Acute hypoxemic respiratory failure: Most likely in the setting of encephalopathy and aspiration of oropharyngeal secretions with appearance of bloody frothy secretions through trachea during intubation.  Also possible combination of volume overload in setting of renal failure and blood products, increased fluid load from chemotherapy etc. Plan: -will attempt to wean sedation and consider sbt  later today -LTVV strategy with tidal volumes of 6-8 cc/kg ideal body weight -Wean PEEP/FiO2 for SpO2 >92% -VAP bundle in place -Daily SAT and SBT -PAD protocol in place -wean sedation for RASS goal 0 to -1 -zosyn  -follow cultures   Multiple myeloma - Presented with encephalopathy, AKI, hypocalcemia, lytic bone lesions, Elevated immunoglobulins - Bone marrow results consistent with myeloma Plan: -  Appreciate hematology/oncology assistance, started chemo 4/22 - Trend TLS labs   Paroxysmal atrial fibrillation with RVR: converted to ST 4/22 AM Plan: -heparin  on hold w/ anemia and hematoma -cont amio -cards following; appreciate recs   AKI: Creatinine, BUN slowly rising but urine output appears to be picking up which is encouraging -started CRRT 4/23 Plan: -nephro following; cont CRRT -Trend BMP / urinary output -Replace electrolytes as indicated -Avoid nephrotoxic agents, ensure adequate renal perfusion   Fever: Related to pneumonia, possible myeloma. Plan: -cont zosyn  -follow cultures   ABLA from right groin hematoma: Related to myeloma, lab checks, prior hydration with hypercalcemia, hematoma tracks from site of bone marrow biopsy down to thigh on CT. Hgb now stable.  Plan: -off AC -trend cbc -transfuse for hgb <7 or hemodynamically unstable   Acute metabolic encephalopathy - Multifactorial, sepsis, hypercalcemia, myeloma Hyperammonemia History of seizure disorder Plan: -limit sedating meds -treat for sepsis as above -lactulose  held yesterday given increased stool outpt; check ammonia -will check eeg given hx of seizures -Continue seizure precautions -Continue Depakote  (home med)   Shock: sedation related, hypovolemia questioned as increase pressors with UF via CRRT; sepsis? Plan: -wean levo/vaso for map goal >65 -consider going even on CRRT if worsening pressor requirments -abx; follow cultures   History of obstructive sleep apnea Plan: - CPAP at night if improves   Hypoalbuminemia Plan: -trend -TF per RD   T2DM Plan: -increase SSI and TF coverage -cont basal  Depression/anxiety Plan: -hold buspar , paxil , klonopin ; hold wellbutrin (with hx of seizure disorder should likely not be restarted)   Peripheral neuropathy Plan: -hold gabapentin    Best Practice (right click and "Reselect all SmartList Selections" daily)   Diet/type: tubefeeds DVT  prophylaxis SCD Pressure ulcer(s): N/A GI prophylaxis: H2B Lines: Central line Foley:  Yes, and it is still needed Code Status:  full code Last date of multidisciplinary goals of care discussion [4/23 wife updated over phone]  Labs   CBC: Recent Labs  Lab 07/08/23 0828 07/08/23 1800 07/09/23 0504 07/09/23 1631 07/10/23 0411 07/11/23 0408  WBC 15.7*  --  17.3* 12.4* 12.2* 8.6  NEUTROABS 7.9*  --  13.5* 8.0* 10.4* 6.5  HGB 5.6* 6.2* 7.4* 7.3* 7.3* 7.3*  HCT 19.5* 21.4* 23.5* 23.7* 23.8* 24.3*  MCV 97.5  --  90.7 91.2 91.2 94.2  PLT 147*  --  108* 100* 112* 115*    Basic Metabolic Panel: Recent Labs  Lab 07/08/23 1800 07/09/23 0504 07/09/23 1631 07/10/23 0411 07/10/23 1625 07/11/23 0408  NA  --  146* 140 133*  133* 133* 128*  K  --  4.6 3.9  3.9 4.0  4.0 4.3 4.3  CL  --  118* 112* 105  104 103 98  CO2  --  20* 22 22  23 24 24   GLUCOSE  --  281* 299* 284*  284* 267* 323*  BUN  --  97* 89* 62*  66* 53* 51*  CREATININE  --  6.06* 5.00* 3.74*  3.64* 3.24* 2.88*  CALCIUM   --  7.4* 7.0* 6.9*  6.9* 7.1* 7.0*  MG 2.1 2.2 2.2 2.3  2.5*  --   PHOS 4.2 3.4 2.9  2.9 3.0  2.8 2.6 2.1*   GFR: Estimated Creatinine Clearance: 32.1 mL/min (A) (by C-G formula based on SCr of 2.88 mg/dL (H)). Recent Labs  Lab 07/08/23 0828 07/09/23 0504 07/09/23 1631 07/10/23 0411 07/11/23 0408  PROCALCITON 0.54  --   --   --   --   WBC 15.7* 17.3* 12.4* 12.2* 8.6    Liver Function Tests: Recent Labs  Lab 07/06/23 0305 07/07/23 0339 07/08/23 0828 07/09/23 0504 07/09/23 1631 07/10/23 0411 07/10/23 1625 07/11/23 0408  AST 27 13* 27 27  --  21  --   --   ALT 13 11 16 25   --  38  --   --   ALKPHOS 30* 31* 37* 43  --  42  --   --   BILITOT 0.7 0.5 0.5 0.6  --  0.5  --   --   PROT >12.0* >12.0* 11.1* 10.4*  --  11.3*  --   --   ALBUMIN  1.5* <1.5* <1.5* <1.5* <1.5* 1.6*  1.7* 1.8* 1.9*   No results for input(s): "LIPASE", "AMYLASE" in the last 168 hours. Recent Labs  Lab  07/05/23 1716 07/07/23 0339 07/08/23 0828  AMMONIA 81* 70* 116*    ABG    Component Value Date/Time   PHART 7.33 (L) 07/08/2023 1252   PCO2ART 47 07/08/2023 1252   PO2ART 369 (H) 07/08/2023 1252   HCO3 24.3 07/08/2023 1252   TCO2 28 01/29/2023 1044   ACIDBASEDEF 1.5 07/08/2023 1252   O2SAT 100 07/08/2023 1252     Coagulation Profile: Recent Labs  Lab 07/05/23 2227 07/07/23 2030  INR 2.1* 1.9*    Cardiac Enzymes: No results for input(s): "CKTOTAL", "CKMB", "CKMBINDEX", "TROPONINI" in the last 168 hours.  HbA1C: Hgb A1c MFr Bld  Date/Time Value Ref Range Status  01/29/2023 02:40 PM 6.7 (H) 4.8 - 5.6 % Final    Comment:    (NOTE) Pre diabetes:          5.7%-6.4%  Diabetes:              >6.4%  Glycemic control for   <7.0% adults with diabetes   11/12/2021 11:30 AM 6.5 (H) 4.8 - 5.6 % Final    Comment:    (NOTE) Pre diabetes:          5.7%-6.4%  Diabetes:              >6.4%  Glycemic control for   <7.0% adults with diabetes     CBG: Recent Labs  Lab 07/10/23 1601 07/10/23 1931 07/11/23 0012 07/11/23 0416 07/11/23 0754  GLUCAP 266* 246* 275* 281* 296*    Review of Systems:   Unable to provide history  Past Medical History:  He,  has a past medical history of Anxiety, Arthritis, Asthma, Bipolar disorder (HCC), Current every day smoker, Depression, Diabetes mellitus, type II (HCC), Dyspnea, History of kidney stones, History of pneumonia, Hyperlipidemia, Hypertension, Morbid obesity (HCC), Sedentary lifestyle, Seizures (HCC), and Sleep apnea.   Surgical History:   Past Surgical History:  Procedure Laterality Date   CARDIAC CATHETERIZATION  2020   carpal tunel Left    COLONOSCOPY     HARDWARE REMOVAL Left 07/26/2022   Procedure: HARDWARE REMOVAL ELBOW;  Surgeon: Laneta Pintos, MD;  Location: [facility] OR;  Service: Orthopedics;  Laterality: Left;   ORIF HUMERUS FRACTURE Left 01/16/2022   Procedure: OPEN REDUCTION INTERNAL FIXATION (ORIF) DISTAL  HUMERUS FRACTURE;  Surgeon: Laneta Pintos, MD;  Location: [facility] OR;  Service: Orthopedics;  Laterality: Left;   ORIF HUMERUS FRACTURE Left 01/29/2023   Procedure: OPEN REDUCTION INTERNAL FIXATION (ORIF) DISTAL HUMERUS FRACTURE;  Surgeon: Laneta Pintos, MD;  Location: [facility] OR;  Service: Orthopedics;  Laterality: Left;   OTHER SURGICAL HISTORY     R & L shoulder   OTHER SURGICAL HISTORY Right    Knee surgery x several   TOTAL HIP ARTHROPLASTY Left 12/26/2017   Procedure: LEFT TOTAL HIP ARTHROPLASTY ANTERIOR APPROACH;  Surgeon: Arnie Lao, MD;  Location: [facility];  Service: Orthopedics;  Laterality: Left;   TOTAL KNEE ARTHROPLASTY Left 11/23/2021   Procedure: LEFT TOTAL KNEE ARTHROPLASTY;  Surgeon: Arnie Lao, MD;  Location: [facility];  Service: Orthopedics;  Laterality: Left;     Social History:   reports that he has been smoking cigarettes. He has a 12.5 pack-year smoking history. He has never used smokeless tobacco. He reports current drug use. Drug: Marijuana. He reports that he does not drink alcohol.   Family History:  His family history includes Diabetes in his brother, father, mother, and sister; Heart attack in his father and mother; Hypertension in his brother, father, mother, and sister; Osteoporosis in his mother.   Allergies Allergies  Allergen Reactions   Morphine And Codeine Anaphylaxis   Shellfish Allergy Anaphylaxis   Betadine  [Povidone Iodine ] Itching   Bupropion Other (See Comments)    Shaking of the body, hallucinations    Chlorhexidine  Hives    Patient reports never had issues CHG with mouth rinse.   Influenza Vaccines Hives   Metoprolol  Rash     Home Medications  Prior to Admission medications   Medication Sig Start Date End Date Taking? Authorizing Provider  albuterol  (PROVENTIL  HFA;VENTOLIN  HFA) 108 (90 Base) MCG/ACT inhaler Inhale 2 puffs into the lungs every 6 (six) hours as needed for wheezing or shortness of breath.   Yes [provider]  albuterol  (PROVENTIL ) (2.5 MG/3ML) 0.083% nebulizer solution 2.5 mg every 2 (two) hours as needed for wheezing or shortness of breath.   Yes [provider]  aspirin  EC 81 MG tablet Take 81 mg by mouth daily. Swallow whole.   Yes [provider]  busPIRone  (BUSPAR ) 15 MG tablet Take 15 mg by mouth at bedtime.   Yes [provider]  celecoxib  (CELEBREX ) 200 MG capsule Take 200 mg by mouth daily.   Yes [provider]  clonazePAM (KLONOPIN) 1 MG tablet Take 1 mg by mouth once as needed for anxiety.   Yes [provider]  cyclobenzaprine  (FLEXERIL ) 5 MG tablet Take 1 tablet (5 mg total) by mouth 3 (three) times daily as needed for muscle spasms. Patient taking differently: Take 5 mg by mouth daily as needed for muscle spasms. 01/30/23  Yes McClung, Sarah A, PA-C  divalproex  (DEPAKOTE ) 500 MG DR tablet Take 500 mg by mouth 2 (two) times daily.   Yes [provider]  EPINEPHrine  0.3 mg/0.3 mL IJ SOAJ injection Inject 0.3 mg into the muscle as needed for anaphylaxis. 08/28/21  Yes [provider]  esomeprazole (NEXIUM) 40 MG capsule Take 40 mg by mouth in the morning.   Yes [provider]  Fluticasone -Salmeterol (ADVAIR) 500-50 MCG/DOSE AEPB Inhale 2 puffs into the lungs in the morning.   Yes [provider]  furosemide  (LASIX ) 40 MG tablet Take 40 mg by mouth daily as needed for fluid or edema.   Yes [provider]  gabapentin  (NEURONTIN ) 300 MG capsule Take 300-600 mg by mouth See admin instructions. 300 mg in the morning, 600 mg at bedtime   Yes [provider]  hydrALAZINE  (APRESOLINE ) 10 MG tablet Take 10 mg by mouth in the morning and at bedtime.   Yes [provider]  hydrochlorothiazide  (HYDRODIURIL ) 25 MG tablet Take 25 mg by mouth daily.   Yes [provider]  lisinopril  (ZESTRIL ) 40 MG tablet Take 40 mg by mouth daily. 03/25/23  Yes [provider]   metFORMIN  (GLUCOPHAGE ) 1000 MG tablet Take 1,000 mg by mouth 2 (two) times daily with a meal.   Yes [provider]  montelukast  (SINGULAIR ) 10 MG tablet Take 10 mg by mouth at bedtime.   Yes [provider]  OVER THE COUNTER MEDICATION Take 400 mg by mouth at bedtime. Burdock root   Yes [provider]  oxyCODONE  (ROXICODONE ) 5 MG immediate release tablet Take 1 tablet (5 mg total) by mouth every 4 (four) hours as needed for severe pain (pain score 7-10). 01/30/23  Yes Versie Gores, PA-C  PARoxetine  (PAXIL ) 40 MG tablet Take 40 mg by mouth every morning.   Yes [provider]  spironolactone  (ALDACTONE ) 25 MG tablet Take 25 mg by mouth at bedtime. 10/07/21  Yes [provider]  tamsulosin  (FLOMAX ) 0.4 MG CAPS capsule Take 0.4 mg by mouth at bedtime. 10/07/21  Yes [provider]  testosterone  cypionate (DEPOTESTOSTERONE CYPIONATE) 200 MG/ML injection Inject 0.5 mLs (100 mg total) into the muscle every 14 (fourteen) days. 09/17/16  Yes Nida, Gebreselassie W, MD  verapamil  (CALAN -SR) 120 MG CR tablet Take 120 mg by mouth daily.   Yes [provider]  Vitamin D , Ergocalciferol , (DRISDOL ) 1.25 MG (50000 UNIT) CAPS capsule Take 1 capsule (50,000 Units total) by mouth every Thursday. Patient taking differently: Take 50,000 Units by mouth once a week. Tuesday 02/06/23  Yes Versie Gores, PA-C  buPROPion (WELLBUTRIN XL) 150 MG 24 hr tablet Take 150 mg by mouth daily. Patient not taking: Reported on 07/04/2023    [provider]    CRITICAL CARE Performed by: Casimiro Cleaves  Total critical care time: 35 minutes  JD Carliss Chess [facility] 07/11/2023, 9:41 AM  Please see [email or web address] for pager details.  From 7A-7P if no response, please call [phone]. After hours, please call ELink [phone].

## 2023-07-11 NOTE — Procedures (Signed)
 Patient Name: Frank [name].  MRN: [id number]  Epilepsy Attending: Arleene Lack  Referring Physician/Provider: Casimiro Cleaves, PA-C  Date: 07/11/2023 Duration: 24.55 mins  Patient history: 58yo M with ams. EEG to evaluate for seizure  Level of alertness:  comatose/ lethargic   AEDs during EEG study: VPA  Technical aspects: This EEG study was done with scalp electrodes positioned according to the 10-20 International system of electrode placement. Electrical activity was reviewed with band pass filter of 1-70Hz , sensitivity of 7 uV/mm, display speed of 22mm/sec with a 60Hz  notched filter applied as appropriate. EEG data were recorded continuously and digitally stored.  Video monitoring was available and reviewed as appropriate.  Description: EEG showed continuous generalized 3-5 hz theta-delta slowing, at times with triphasic morphology.  Hyperventilation and photic stimulation were not performed.     ABNORMALITY - Continuous slow, generalized  IMPRESSION: This study is suggestive of moderate to severe encephalopathy, could be secondary to toxic metabolic causes. No seizures or epileptiform discharges were seen throughout the recording.  Jaselle Pryer O Tieler Cournoyer

## 2023-07-11 NOTE — Plan of Care (Signed)

## 2023-07-11 NOTE — Progress Notes (Signed)
 Rounding Note    Patient Name: Frank [name]. Date of Encounter: 07/11/2023  [facility] Cardiologist: Wendie Hamburg, MD   Subjective   Intubated and sedated.  Not following commands  Inpatient Medications    Scheduled Meds:  amiodarone   200 mg Per NG tube BID   Followed by   [name] ON 07/24/2023] amiodarone   200 mg Per NG tube Daily   bortezomib  SQ  1.3 mg/m2 (Treatment Plan Recorded) Subcutaneous Once   famotidine   10.4 mg Per Tube Daily   feeding supplement (PROSource TF20)  60 mL Per Tube TID   insulin  aspart  0-20 Units Subcutaneous Q4H   insulin  aspart  5 Units Subcutaneous Q4H   insulin  glargine-yfgn  10 Units Subcutaneous Daily   nicotine   14 mg Transdermal Daily   mouth rinse  15 mL Mouth Rinse Q2H   pneumococcal 20-valent conjugate vaccine  0.5 mL Intramuscular Tomorrow-1000   sodium chloride  flush  10-40 mL Intracatheter Q12H   valproic  acid  250 mg Per Tube Q6H   Continuous Infusions:  sodium chloride  5 mL/hr at 07/11/23 1000   feeding supplement (VITAL 1.5 CAL) 60 mL/hr at 07/11/23 1000   fentaNYL  infusion INTRAVENOUS 100 mcg/hr (07/11/23 1000)   heparin  10,000 units/ 20 mL infusion syringe 750 Units/hr (07/11/23 1000)   norepinephrine  (LEVOPHED ) Adult infusion 14 mcg/min (07/11/23 1000)   piperacillin -tazobactam (ZOSYN )  IV Stopped (07/11/23 0948)   prismasol  BGK 4/2.5 400 mL/hr at 07/11/23 0645   prismasol  BGK 4/2.5 400 mL/hr at 07/11/23 0645   prismasol  BGK 4/2.5 1,500 mL/hr at 07/11/23 0703   vasopressin  0.03 Units/min (07/11/23 1000)   PRN Meds: sodium chloride , acetaminophen  (TYLENOL ) oral liquid 160 mg/5 mL **OR** acetaminophen , fentaNYL  (SUBLIMAZE ) injection, fentaNYL  (SUBLIMAZE ) injection, heparin , LORazepam , midazolam , mouth rinse, oxyCODONE , polyethylene glycol, sodium chloride  flush   Vital Signs    Vitals:   07/11/23 0645 07/11/23 0700 07/11/23 0800 07/11/23 0925  BP: 112/63 109/63 (!) 112/59   Pulse: 91 93 91 84   Resp: 17 19 20 20   Temp:   100.2 F (37.9 C)   TempSrc:   Axillary   SpO2: 99% 100% 99% 100%  Weight:      Height:        Intake/Output Summary (Last 24 hours) at 07/11/2023 1015 Last data filed at 07/11/2023 1007 Gross per 24 hour  Intake 3294.28 ml  Output 5117.7 ml  Net -1823.42 ml      07/11/2023    5:00 AM 07/10/2023    4:56 AM 07/09/2023    5:00 AM  Last 3 Weights  Weight (lbs) 215 lb 13.3 oz 231 lb 14.8 oz 239 lb 3.2 oz  Weight (kg) 97.9 kg 105.2 kg 108.5 kg      Telemetry    Sinus rhythm- Personally Reviewed  ECG    No new ECG- Personally Reviewed  Physical Exam   GEN: Intubated Neck: CVC in right IJ Cardiac: RRR no murmurs, rubs, or gallops.  Respiratory: Diminished breath sounds GI: Soft MS: No edema Neuro: Not answering questions or following commands Psych: Unable to assess  Labs    High Sensitivity Troponin:  No results for input(s): "TROPONINIHS" in the last 720 hours.   Chemistry Recent Labs  Lab 07/09/23 0504 07/09/23 1631 07/10/23 0411 07/10/23 1625 07/11/23 0408 07/11/23 0934  NA 146* 140 133*  133* 133* 128* 129*  K 4.6 3.9  3.9 4.0  4.0 4.3 4.3 4.1  CL 118* 112* 105  104 103  98 99  CO2 20* 22 22  23 24 24 23   GLUCOSE 281* 299* 284*  284* 267* 323* 301*  BUN 97* 89* 62*  66* 53* 51* 49*  CREATININE 6.06* 5.00* 3.74*  3.64* 3.24* 2.88* 2.79*  CALCIUM  7.4* 7.0* 6.9*  6.9* 7.1* 7.0* 6.9*  MG 2.2 2.2 2.3 2.5*  --   --   PROT 10.4*  --  11.3*  --   --  11.8*  ALBUMIN  <1.5* <1.5* 1.6*  1.7* 1.8* 1.9* 1.8*  AST 27  --  21  --   --  25  ALT 25  --  38  --   --  43  ALKPHOS 43  --  42  --   --  42  BILITOT 0.6  --  0.5  --   --  0.4  GFRNONAA 10* 13* 18*  18* 21* 24* 25*  ANIONGAP 8 6 6  6 6 6 7     Lipids No results for input(s): "CHOL", "TRIG", "HDL", "LABVLDL", "LDLCALC", "CHOLHDL" in the last 168 hours.  Hematology Recent Labs  Lab 07/09/23 1631 07/10/23 0411 07/11/23 0408  WBC 12.4* 12.2* 8.6  RBC 2.60* 2.61*  2.58*  HGB 7.3* 7.3* 7.3*  HCT 23.7* 23.8* 24.3*  MCV 91.2 91.2 94.2  MCH 28.1 28.0 28.3  MCHC 30.8 30.7 30.0  RDW 21.1* 21.1* 19.9*  PLT 100* 112* 115*   Thyroid No results for input(s): "TSH", "FREET4" in the last 168 hours.  BNPNo results for input(s): "BNP", "PROBNP" in the last 168 hours.  DDimer  Recent Labs  Lab 07/07/23 2030  DDIMER 0.93*     Radiology    DG Chest 1 View Result Date: 07/09/2023 CLINICAL DATA:  Status post central line placement EXAM: PORTABLE CHEST 1 VIEW COMPARISON:  07/08/2023 FINDINGS: Endotracheal tube and feeding catheter are noted in satisfactory position. New right jugular catheter is seen in satisfactory position. No pneumothorax is noted. The overall inspiratory effort is poor with crowding of the vascular markings. IMPRESSION: Poor inspiratory effort. No pneumothorax following central line placement. Electronically Signed   By: Violeta Grey M.D.   On: 07/09/2023 11:19    Cardiac Studies     Patient Profile     58 y.o. 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 patient initially presented in to outside hospital with altered mental status and was found to have sepsis secondary to community-acquired pneumonia, acute metabolic encephalopathy secondary to sepsis, AKI, hypercalcemia, symptomatic anemia requiring 3 unit PRBCs, and lytic bone lesions.  Oncology team evaluated the patient and there was concern for new diagnosis of multiple myeloma.   Assessment & Plan    Atrial fibrillation with RVR: Likely caused by acute illness.  CHA2DS2-VASc 2 (diabetes, hypertension).  Echocardiogram with EF 65 to 70%, normal RV function, moderate left atrial enlargement, no significant valve disease, RAP 15 -  Maintaining sinus rhythm.  Switched to PO amiodarone  yesterday.  Would plan short course of PO amio to maintain sinus rhythm as recovers from acute illness.  Recommend amio 200 mg BID x2 weeks then 200 mg daily x 2 weeks, then discontinue -heparin  gtt discontinued given bleeding and anemia.  Would continue to hold  anticoagulation   Acute respiratory failure with hypoxia: Status  post intubation on 4/22.  PCCM managing  Multiple myeloma: Oncology following started on treatment  AKI: Creatinine up to 6.1.  Nephrology following, started on CRRT  Anemia: Hgb down to 5.6 on 4/22.  Found to have right groin hematoma.  Received multiple units PRBCs.  Off heparin  drip.  Hemoglobin 7.3 today  [facility] will sign off.   Medication Recommendations:  Amiodarone  200mg  BID x14 days then decrease to 200 mg daily x 14 days then discontinue Other recommendations (labs, testing, etc):  None Follow up as an outpatient:  Please contact us  closer to discharge and f/u can be arranged   For questions or updates, please contact [facility] Please consult [email or web address] for contact info under        Signed, Wendie Hamburg, MD  07/11/2023, 10:15 AM

## 2023-07-11 NOTE — Progress Notes (Signed)
 So far, everything really is about the same.  He is still on pressor support.  He is still intubated.  He is still sedated.  He is getting continuous dialysis.  Of note, his albumin  is slowly coming up.  Again I had to believe this is reflective of the treatment that he took on Tuesday.  He has his next treatment either today or Saturday.  It is just subcutaneous Velcade .  He is having some temperatures.  I think cultures have been negative although respiratory culture did show Staph epi.  I am unsure exactly if this is a true pathogen.  His CBC shows white cell count of 8.6.  Hemoglobin 7.3.  Platelet count 115,000.  I probably would hold off on transfusing him right now.  His total protein is coming down.  Again, I think this is reflective of the fact that his chemotherapy is helping.  BUN/creatinine are 51 and 2.88.  He is getting tube feeds.  Hopefully, his overall mental state will start to improve and he will be able to come off sedation.  Again, I suspect he should get his Velcade  either today or tomorrow.  If he gets it today, it is 1 day early but this really should not be a problem because his blood counts are adequate and located really is not myelosuppressive.  I know there is a long way to go.  He is getting incredible care from the staff in the ICU.  I know this is incredibly complicated.  [name], MD  [facility] 41:10

## 2023-07-11 NOTE — Progress Notes (Signed)
 EEG complete, results are pending.

## 2023-07-11 NOTE — Progress Notes (Signed)
 [name] PROGRESS NOTE  Assessment/ Plan: Pt is a 58 y.o. yo male with past medical history significant for HTN, HLD, type II DM, BPH, seizure who was initially presented at [facility] due to AMS, admitted for sepsis due to pneumonia, encephalopathy, sepsis, AKI, hypercalcemia symptomatic anemia and Lytic pulm lesion concern for multiple myeloma.  # AKI on CKD 3a - b/l creat 1.1- 1.5 from 2024, eGFR 53- >60 ml/min. Creat here 5.8 on presentation at OSH and last several days in the 4.0- 4.8 range. AKI due to possible myeloma kidney +/- hyperCa +/- ACEi (home med).  Initially treated with IV fluid and then required Lasix  because of fluid overload.   Started CRRT on 4/23, all 4K bath. UF 50 to 100 cc an hour, no systemic heparin  because of bleeding and hematoma however he will need limited heparin  in the circuit. Continue with strict ins and out and close lab monitoring. Continue CRRT at current prescription, discussed ICU team.  # Multiple myeloma: Oncologist started Velcade  and Cytoxan .  Management per oncology team.    # Shock-currently on Levophed  and vasopressin .  Monitor BP.  #Volume -required IV diuretics.   now UF with CRRT.  # Atrial fib-  on IV amio and IV diltiazem .  #Hypercalcemia -  resolved  now. Possible myeloma, w/u in progress, oncology following. SP zometa  here and calcitonin.   #Multifocal PNA - on IV abx  #Anemia -getting PRBC transfusion.  # AMS/acute metabolic encephalopathy.  Concerned about hyperviscosity.  Oncology team and PCCM following.  Started CRRT for azotemia.  # Hyperuricemia: Rasburicase  and dialysis.  Discussed with ICU provider.  Subjective: Seen and examined in ICU.  Intubated, sedated, remains on Levophed  and vasopressin .  No significant urine output.  Required filter change this morning.  Tolerating CRRT well.   Objective Vital signs in last 24 hours: Vitals:   07/11/23 0630 07/11/23 0645 07/11/23 0700  07/11/23 0800  BP: 117/60 112/63 109/63 (!) 112/59  Pulse: 92 91 93 91  Resp: 17 17 19 20   Temp:    100.2 F (37.9 C)  TempSrc:    Axillary  SpO2: 99% 99% 100% 99%  Weight:      Height:       Weight change: -7.3 kg  Intake/Output Summary (Last 24 hours) at 07/11/2023 0842 Last data filed at 07/11/2023 0807 Gross per 24 hour  Intake 3295.54 ml  Output 5260.7 ml  Net -1965.16 ml       Labs: RENAL PANEL Recent Labs  Lab 07/08/23 1800 07/09/23 0504 07/09/23 1631 07/10/23 0411 07/10/23 1625 07/11/23 0408  NA  --  146* 140 133*  133* 133* 128*  K  --  4.6 3.9  3.9 4.0  4.0 4.3 4.3  CL  --  118* 112* 105  104 103 98  CO2  --  20* 22 22  23 24 24   GLUCOSE  --  281* 299* 284*  284* 267* 323*  BUN  --  97* 89* 62*  66* 53* 51*  CREATININE  --  6.06* 5.00* 3.74*  3.64* 3.24* 2.88*  CALCIUM   --  7.4* 7.0* 6.9*  6.9* 7.1* 7.0*  MG 2.1 2.2 2.2 2.3 2.5*  --   PHOS 4.2 3.4 2.9  2.9 3.0  2.8 2.6 2.1*  ALBUMIN   --  <1.5* <1.5* 1.6*  1.7* 1.8* 1.9*    Liver Function Tests: Recent Labs  Lab 07/08/23 4098 07/09/23 0504 07/09/23 1631 07/10/23 0411 07/10/23 1625 07/11/23 0408  AST 27 27  --  21  --   --   ALT 16 25  --  38  --   --   ALKPHOS 37* 43  --  42  --   --   BILITOT 0.5 0.6  --  0.5  --   --   PROT 11.1* 10.4*  --  11.3*  --   --   ALBUMIN  <1.5* <1.5*   < > 1.6*  1.7* 1.8* 1.9*   < > = values in this interval not displayed.   No results for input(s): "LIPASE", "AMYLASE" in the last 168 hours. Recent Labs  Lab 07/05/23 1716 07/07/23 0339 07/08/23 0828  AMMONIA 81* 70* 116*   CBC: Recent Labs    07/08/23 1800 07/09/23 0504 07/09/23 1631 07/10/23 0411 07/11/23 0408  HGB 6.2* 7.4* 7.3* 7.3* 7.3*  MCV  --  90.7 91.2 91.2 94.2    Cardiac Enzymes: No results for input(s): "CKTOTAL", "CKMB", "CKMBINDEX", "TROPONINI" in the last 168 hours. CBG: Recent Labs  Lab 07/10/23 1601 07/10/23 1931 07/11/23 0012 07/11/23 0416 07/11/23 0754   GLUCAP 266* 246* 275* 281* 296*    Iron Studies: No results for input(s): "IRON", "TIBC", "TRANSFERRIN", "FERRITIN" in the last 72 hours. Studies/Results: DG Chest 1 View Result Date: 07/09/2023 CLINICAL DATA:  Status post central line placement EXAM: PORTABLE CHEST 1 VIEW COMPARISON:  07/08/2023 FINDINGS: Endotracheal tube and feeding catheter are noted in satisfactory position. New right jugular catheter is seen in satisfactory position. No pneumothorax is noted. The overall inspiratory effort is poor with crowding of the vascular markings. IMPRESSION: Poor inspiratory effort. No pneumothorax following central line placement. Electronically Signed   By: Violeta Grey M.D.   On: 07/09/2023 11:19    Medications: Infusions:  sodium chloride  Stopped (07/11/23 0548)   feeding supplement (VITAL 1.5 CAL) 60 mL/hr at 07/11/23 0800   fentaNYL  infusion INTRAVENOUS 100 mcg/hr (07/11/23 0800)   heparin  10,000 units/ 20 mL infusion syringe 750 Units/hr (07/11/23 0800)   norepinephrine  (LEVOPHED ) Adult infusion 14 mcg/min (07/11/23 0800)   piperacillin -tazobactam (ZOSYN )  IV 12.5 mL/hr at 07/11/23 0800   prismasol  BGK 4/2.5 400 mL/hr at 07/11/23 0645   prismasol  BGK 4/2.5 400 mL/hr at 07/11/23 0645   prismasol  BGK 4/2.5 1,500 mL/hr at 07/11/23 0703   vasopressin  0.03 Units/min (07/11/23 0819)    Scheduled Medications:  amiodarone   200 mg Per NG tube BID   Followed by   [name] ON 07/24/2023] amiodarone   200 mg Per NG tube Daily   famotidine   10.4 mg Per Tube Daily   feeding supplement (PROSource TF20)  60 mL Per Tube TID   insulin  aspart  0-15 Units Subcutaneous Q4H   insulin  aspart  2 Units Subcutaneous Q4H   insulin  glargine-yfgn  10 Units Subcutaneous Daily   nicotine   14 mg Transdermal Daily   mouth rinse  15 mL Mouth Rinse Q2H   pneumococcal 20-valent conjugate vaccine  0.5 mL Intramuscular Tomorrow-1000   sodium chloride  flush  10-40 mL Intracatheter Q12H   valproic  acid  250 mg Per Tube  Q6H    have reviewed scheduled and prn medications.  Physical Exam: General: Critically ill looking male, intubated, sedated Heart:RRR, s1s2 nl Lungs: Coarse breath sound bilateral Abdomen:soft, Non-tender, non-distended Extremities:No edema Neurology: Sedated  [name] 07/11/2023,8:42 AM  LOS: 8 days

## 2023-07-12 ENCOUNTER — Encounter (HOSPITAL_COMMUNITY): Payer: Self-pay | Admitting: Hematology & Oncology

## 2023-07-12 DIAGNOSIS — J9601 Acute respiratory failure with hypoxia: Secondary | ICD-10-CM | POA: Diagnosis not present

## 2023-07-12 DIAGNOSIS — C9000 Multiple myeloma not having achieved remission: Secondary | ICD-10-CM | POA: Diagnosis not present

## 2023-07-12 DIAGNOSIS — G9341 Metabolic encephalopathy: Secondary | ICD-10-CM | POA: Diagnosis not present

## 2023-07-12 DIAGNOSIS — R531 Weakness: Secondary | ICD-10-CM | POA: Diagnosis not present

## 2023-07-12 DIAGNOSIS — E119 Type 2 diabetes mellitus without complications: Secondary | ICD-10-CM | POA: Diagnosis not present

## 2023-07-12 LAB — HEMOGLOBIN AND HEMATOCRIT, BLOOD
HCT: 27.1 % — ABNORMAL LOW (ref 39.0–52.0)
Hemoglobin: 8.4 g/dL — ABNORMAL LOW (ref 13.0–17.0)

## 2023-07-12 LAB — CBC WITH DIFFERENTIAL/PLATELET
Abs Immature Granulocytes: 0.25 10*3/uL — ABNORMAL HIGH (ref 0.00–0.07)
Basophils Absolute: 0 10*3/uL (ref 0.0–0.1)
Basophils Relative: 0 %
Eosinophils Absolute: 0.1 10*3/uL (ref 0.0–0.5)
Eosinophils Relative: 1 %
HCT: 23.4 % — ABNORMAL LOW (ref 39.0–52.0)
Hemoglobin: 7 g/dL — ABNORMAL LOW (ref 13.0–17.0)
Immature Granulocytes: 3 %
Lymphocytes Relative: 17 %
Lymphs Abs: 1.6 10*3/uL (ref 0.7–4.0)
MCH: 28.7 pg (ref 26.0–34.0)
MCHC: 29.9 g/dL — ABNORMAL LOW (ref 30.0–36.0)
MCV: 95.9 fL (ref 80.0–100.0)
Monocytes Absolute: 0.4 10*3/uL (ref 0.1–1.0)
Monocytes Relative: 4 %
Neutro Abs: 6.9 10*3/uL (ref 1.7–7.7)
Neutrophils Relative %: 75 %
Platelets: 146 10*3/uL — ABNORMAL LOW (ref 150–400)
RBC: 2.44 MIL/uL — ABNORMAL LOW (ref 4.22–5.81)
RDW: 19.5 % — ABNORMAL HIGH (ref 11.5–15.5)
WBC: 9.2 10*3/uL (ref 4.0–10.5)
nRBC: 7.3 % — ABNORMAL HIGH (ref 0.0–0.2)

## 2023-07-12 LAB — COMPREHENSIVE METABOLIC PANEL WITH GFR
ALT: 52 U/L — ABNORMAL HIGH (ref 0–44)
AST: 33 U/L (ref 15–41)
Albumin: 2.1 g/dL — ABNORMAL LOW (ref 3.5–5.0)
Alkaline Phosphatase: 45 U/L (ref 38–126)
Anion gap: 9 (ref 5–15)
BUN: 50 mg/dL — ABNORMAL HIGH (ref 6–20)
CO2: 23 mmol/L (ref 22–32)
Calcium: 7.3 mg/dL — ABNORMAL LOW (ref 8.9–10.3)
Chloride: 98 mmol/L (ref 98–111)
Creatinine, Ser: 2.32 mg/dL — ABNORMAL HIGH (ref 0.61–1.24)
GFR, Estimated: 32 mL/min — ABNORMAL LOW (ref >60)
Glucose, Bld: 216 mg/dL — ABNORMAL HIGH (ref 70–99)
Potassium: 4.4 mmol/L (ref 3.5–5.1)
Sodium: 130 mmol/L — ABNORMAL LOW (ref 135–145)
Total Bilirubin: 0.5 mg/dL (ref 0.0–1.2)
Total Protein: >12 g/dL — ABNORMAL HIGH (ref 6.5–8.1)

## 2023-07-12 LAB — GLUCOSE, CAPILLARY
Glucose-Capillary: 195 mg/dL — ABNORMAL HIGH (ref 70–99)
Glucose-Capillary: 210 mg/dL — ABNORMAL HIGH (ref 70–99)
Glucose-Capillary: 251 mg/dL — ABNORMAL HIGH (ref 70–99)
Glucose-Capillary: 276 mg/dL — ABNORMAL HIGH (ref 70–99)
Glucose-Capillary: 288 mg/dL — ABNORMAL HIGH (ref 70–99)
Glucose-Capillary: 306 mg/dL — ABNORMAL HIGH (ref 70–99)
Glucose-Capillary: 343 mg/dL — ABNORMAL HIGH (ref 70–99)

## 2023-07-12 LAB — PREPARE RBC (CROSSMATCH)

## 2023-07-12 LAB — IRON AND TIBC
Iron: 79 ug/dL (ref 45–182)
Saturation Ratios: 26 % (ref 17.9–39.5)
TIBC: 308 ug/dL (ref 250–450)
UIBC: 229 ug/dL

## 2023-07-12 LAB — RENAL FUNCTION PANEL
Albumin: 1.7 g/dL — ABNORMAL LOW (ref 3.5–5.0)
Anion gap: 6 (ref 5–15)
BUN: 47 mg/dL — ABNORMAL HIGH (ref 6–20)
CO2: 19 mmol/L — ABNORMAL LOW (ref 22–32)
Calcium: 5.9 mg/dL — CL (ref 8.9–10.3)
Chloride: 110 mmol/L (ref 98–111)
Creatinine, Ser: 2.15 mg/dL — ABNORMAL HIGH (ref 0.61–1.24)
GFR, Estimated: 35 mL/min — ABNORMAL LOW (ref >60)
Glucose, Bld: 278 mg/dL — ABNORMAL HIGH (ref 70–99)
Phosphorus: 4 mg/dL (ref 2.5–4.6)
Potassium: 4 mmol/L (ref 3.5–5.1)
Sodium: 135 mmol/L (ref 135–145)

## 2023-07-12 LAB — MAGNESIUM: Magnesium: 2.7 mg/dL — ABNORMAL HIGH (ref 1.7–2.4)

## 2023-07-12 LAB — AMMONIA: Ammonia: 58 umol/L — ABNORMAL HIGH (ref 9–35)

## 2023-07-12 LAB — PHOSPHORUS: Phosphorus: <1 mg/dL — CL (ref 2.5–4.6)

## 2023-07-12 LAB — RASBURICASE - URIC ACID: Uric Acid, Serum: <1.5 mg/dL — ABNORMAL LOW (ref 3.7–8.6)

## 2023-07-12 LAB — APTT: aPTT: 29 s (ref 24–36)

## 2023-07-12 MED ORDER — SODIUM PHOSPHATES 45 MMOLE/15ML IV SOLN
30.0000 mmol | Freq: Once | INTRAVENOUS | Status: AC
  Administered 2023-07-12: 30 mmol via INTRAVENOUS
  Filled 2023-07-12: qty 10

## 2023-07-12 MED ORDER — SODIUM CHLORIDE 0.9% IV SOLUTION: Freq: Once | INTRAVENOUS | Status: AC

## 2023-07-12 MED ORDER — EPOETIN ALFA 20000 UNIT/ML IJ SOLN
40000.0000 [IU] | INTRAMUSCULAR | Status: DC
  Administered 2023-07-12 – 2023-07-16 (×3): 40000 [IU] via SUBCUTANEOUS
  Filled 2023-07-12 (×3): qty 2

## 2023-07-12 NOTE — Progress Notes (Signed)
 NAME:  Frank Yerena., MRN:  [id number], DOB:  08-Jun-1965, LOS: 9 ADMISSION DATE:  07/03/2023, CONSULTATION DATE:  07/06/23 REFERRING MD:  Dr Maury Space, CHIEF COMPLAINT:  Encephalopathy   History of Present Illness:  Asked to see patient for encephalopathy   Transferred from [facility] with 1 week of generalized weakness, altered mentation, decreased appetite Recently being worked up for multiple myeloma.  Transferred to [facility] for oncology evaluation Nephrology consulted for AKI   Background history of obesity, asthma, obstructive sleep apnea on CPAP GERD, hypertension, hyperlipidemia Has been feeling weak for the last few days according to spouse Admitted with concern for sepsis, community-acquired pneumonia, antibiotic encephalopathy Workup revealed lytic bony lesions with concern for multiple myeloma Transfused with 3 units packed red blood cells Evaluation so far-bone marrow 07/04/2023  Pertinent  Medical History   Past Medical History:  Diagnosis Date   Anxiety    Arthritis    Asthma    Bipolar disorder (HCC)    Current every day smoker    Depression    Diabetes mellitus, type II (HCC)    Dyspnea    History of kidney stones    History of pneumonia    Hyperlipidemia    Hypertension    Morbid obesity (HCC)    Sedentary lifestyle    Seizures (HCC)    last seizure 12 yrs ago, no current problem   Sleep apnea    uses CPAP nightly   Significant Hospital Events: Including procedures, antibiotic start and stop dates in addition to other pertinent events   4/19-blood cultures, MRSA PCR negative 4/19 chest x-ray with mild pulmonary congestion 4/20 atrial fibrillation with RVR 4/21 nosebleed after IR NG attempt, NG placed by PCCM in afternoon, stable transferred to [facility] 4/22 intubated; some abla requiring prbcs; ct abd/pelvis showing acute groin hematoma tracking from posterior presumed from site of bone marrow biopsy; heparin  held EEG 4/25 >> moderate to severe  encephalopathy without any seizures or epileptiform discharges  Interim History / Subjective:   EEG/25 reassuring Remains on CVVH, I/O- 2.7 L total 0.30, PEEP 5 Norepinephrine  17, vasopressin  0.03 Fentanyl  175   Objective   Blood pressure 106/65, pulse 92, temperature (!) 97.5 F (36.4 C), temperature source Axillary, resp. rate 20, height 6' 0.01" (1.829 m), weight 97.9 kg, SpO2 100%.    Vent Mode: PRVC FiO2 (%):  [30 %] 30 % Set Rate:  [20 bmp] 20 bmp Vt Set:  [650 mL] 650 mL PEEP:  [5 cmH20] 5 cmH20 Plateau Pressure:  [16 cmH20-20 cmH20] 17 cmH20   Intake/Output Summary (Last 24 hours) at 07/12/2023 0844 Last data filed at 07/12/2023 0800 Gross per 24 hour  Intake 3220.98 ml  Output 4783.4 ml  Net -1562.42 ml   Filed Weights   07/09/23 0500 07/10/23 0456 07/11/23 0500  Weight: 108.5 kg 105.2 kg 97.9 kg    Examination: General: Critically ill-appearing man, ventilated HEENT: ET tube in good position, no secretions Neuro: Wakes to voice, did follow commands.  Globally weak CV: Distant, regular, no murmur PULM: Decreased at both bases, no wheezing, no crackles GI: Nondistended with positive bowel sounds Extremities: Firm right groin hematoma that has not expanded outside the marked perimeter Skin: No rash   Resolved Hospital Problem list   Hypercalcemia-resolved  Assessment & Plan:  Acute hypoxemic respiratory failure: Most likely in the setting of encephalopathy and aspiration of oropharyngeal secretions with appearance of bloody frothy secretions through trachea during intubation.  Also possible combination of volume overload  in setting of renal failure and blood products, increased fluid load from chemotherapy etc. Plan: -Continue to work on weaning sedation 4/26 - PRVC 8 cc/kg, work towards SBT as he stabilizes hemodynamically - Sedation as per PAD protocol - VAP prevention orders - Pulmonary hygiene - Remains on Zosyn  for possible aspiration pneumonia.   Following culture data   Multiple myeloma Suspected tumor lysis - Presented with encephalopathy, AKI, hypocalcemia, lytic bone lesions, Elevated immunoglobulins - Bone marrow results consistent with myeloma Plan: -Appreciate hematology/oncology assistance.  Received Velcade  4/25, next chemotherapy planned for 4/29 -Following TLS labs   Paroxysmal atrial fibrillation with RVR: converted to ST 4/22 AM Plan: -Anticoagulation on hold - Continue amiodarone  - Appreciate cardiology assistance   AKI: Creatinine, BUN slowly rising but urine output appears to be picking up which is encouraging -started CRRT 4/23 Plan: -Appreciate nephrology management, on CVVH with some volume removal.  May need to change to make him even if he continues to have pressor needs - No urine output, following - Follow BMP, replete electrolytes as indicated   Fever: Related to pneumonia, possible myeloma. Plan: -cont zosyn  -follow cultures   ABLA from right groin hematoma: Related to myeloma, lab checks, prior hydration with hypercalcemia, hematoma tracks from site of bone marrow biopsy down to thigh on CT. Hgb now stable.  Plan: -Off anticoagulation - Following intermittent CBC - Transfusion goal hemoglobin 7.0   Acute metabolic encephalopathy - Multifactorial, sepsis, hypercalcemia, myeloma Hyperammonemia History of seizure disorder Plan: -No evidence seizures on EEG 4/25.  Plan to continue his Depakote  as ordered -Minimize sedation as able - Follow intermittent ammonia and dose lactulose  if rising.  Held for now given increased stool output   Shock: sedation related, hypovolemia questioned as increase pressors with UF via CRRT; possible sepsis Plan: -Continue to wean norepinephrine , MAP goal 65 - Would consider holding off on net UF with his CVVH - Continue empiric antibiotics   History of obstructive sleep apnea Plan: - CPAP at night if improves   Hypoalbuminemia Plan: -Tube feeding RD    T2DM Plan: -Continue current sliding scale insulin  and tube feeding coverage, basal insulin   Depression/anxiety Plan: - Continue to hold buspar , paxil , klonopin; hold wellbutrin (with hx of seizure disorder should likely not be restarted)   Peripheral neuropathy Plan: - Continue to hold gabapentin    Best Practice (right click and "Reselect all SmartList Selections" daily)   Diet/type: tubefeeds DVT prophylaxis SCD Pressure ulcer(s): N/A GI prophylaxis: H2B Lines: Central line Foley:  Yes, and it is still needed Code Status:  full code Last date of multidisciplinary goals of care discussion [4/23 wife updated over phone]  Labs   CBC: Recent Labs  Lab 07/09/23 0504 07/09/23 1631 07/10/23 0411 07/11/23 0408 07/12/23 0443  WBC 17.3* 12.4* 12.2* 8.6 9.2  NEUTROABS 13.5* 8.0* 10.4* 6.5 6.9  HGB 7.4* 7.3* 7.3* 7.3* 7.0*  HCT 23.5* 23.7* 23.8* 24.3* 23.4*  MCV 90.7 91.2 91.2 94.2 95.9  PLT 108* 100* 112* 115* 146*    Basic Metabolic Panel: Recent Labs  Lab 07/09/23 0504 07/09/23 1631 07/10/23 0411 07/10/23 1625 07/11/23 0408 07/11/23 0934 07/11/23 1637 07/12/23 0443  NA 146* 140 133*  133* 133* 128* 129* 128* 130*  K 4.6 3.9  3.9 4.0  4.0 4.3 4.3 4.1 4.4 4.4  CL 118* 112* 105  104 103 98 99 99 98  CO2 20* 22 22  23 24 24 23 22 23   GLUCOSE 281* 299* 284*  284* 267* 323* 301*  310* 216*  BUN 97* 89* 62*  66* 53* 51* 49* 51* 50*  CREATININE 6.06* 5.00* 3.74*  3.64* 3.24* 2.88* 2.79* 2.66* 2.32*  CALCIUM  7.4* 7.0* 6.9*  6.9* 7.1* 7.0* 6.9* 7.0* 7.3*  MG 2.2 2.2 2.3 2.5*  --   --   --  2.7*  PHOS 3.4 2.9  2.9 3.0  2.8 2.6 2.1*  --  RESULTS UNAVAILABLE DUE TO INTERFERING SUBSTANCE <1.0*   GFR: Estimated Creatinine Clearance: 41.6 mL/min (A) (by C-G formula based on SCr of 2.32 mg/dL (H)). Recent Labs  Lab 07/08/23 0828 07/09/23 0504 07/09/23 1631 07/10/23 0411 07/11/23 0408 07/12/23 0443  PROCALCITON 0.54  --   --   --   --   --   WBC 15.7*   <  > 12.4* 12.2* 8.6 9.2   < > = values in this interval not displayed.    Liver Function Tests: Recent Labs  Lab 07/08/23 0828 07/09/23 0504 07/09/23 1631 07/10/23 0411 07/10/23 1625 07/11/23 0408 07/11/23 0934 07/11/23 1637 07/12/23 0443  AST 27 27  --  21  --   --  25  --  33  ALT 16 25  --  38  --   --  43  --  52*  ALKPHOS 37* 43  --  42  --   --  42  --  45  BILITOT 0.5 0.6  --  0.5  --   --  0.4  --  0.5  PROT 11.1* 10.4*  --  11.3*  --   --  11.8*  --  >12.0*  ALBUMIN  <1.5* <1.5*   < > 1.6*  1.7* 1.8* 1.9* 1.8* 2.0* 2.1*   < > = values in this interval not displayed.   No results for input(s): "LIPASE", "AMYLASE" in the last 168 hours. Recent Labs  Lab 07/05/23 1716 07/07/23 0339 07/08/23 0828 07/11/23 1022 07/12/23 0443  AMMONIA 81* 70* 116* 109* 58*    ABG    Component Value Date/Time   PHART 7.33 (L) 07/08/2023 1252   PCO2ART 47 07/08/2023 1252   PO2ART 369 (H) 07/08/2023 1252   HCO3 24.3 07/08/2023 1252   TCO2 28 01/29/2023 1044   ACIDBASEDEF 1.5 07/08/2023 1252   O2SAT 100 07/08/2023 1252     Coagulation Profile: Recent Labs  Lab 07/05/23 2227 07/07/23 2030  INR 2.1* 1.9*    Cardiac Enzymes: No results for input(s): "CKTOTAL", "CKMB", "CKMBINDEX", "TROPONINI" in the last 168 hours.  HbA1C: Hgb A1c MFr Bld  Date/Time Value Ref Range Status  01/29/2023 02:40 PM 6.7 (H) 4.8 - 5.6 % Final    Comment:    (NOTE) Pre diabetes:          5.7%-6.4%  Diabetes:              >6.4%  Glycemic control for   <7.0% adults with diabetes   11/12/2021 11:30 AM 6.5 (H) 4.8 - 5.6 % Final    Comment:    (NOTE) Pre diabetes:          5.7%-6.4%  Diabetes:              >6.4%  Glycemic control for   <7.0% adults with diabetes     CBG: Recent Labs  Lab 07/11/23 1530 07/11/23 2023 07/12/23 0021 07/12/23 0450 07/12/23 0811  GLUCAP 270* 254* 210* 195* 251*    CRITICAL CARE Performed by: Denson Flake  Total critical care time: 34  minutes   Racheal Buddle,  MD, PhD 07/12/2023, 8:45 AM [facility] [phone] or if no answer before 7:00PM call [phone] For any issues after 7:00PM please call eLink [phone]

## 2023-07-12 NOTE — Progress Notes (Signed)
  [facility] PROGRESS NOTE  Assessment/ Plan: Pt is a 58 y.o. yo male with past medical history significant for HTN, HLD, type II DM, BPH, seizure who was initially presented at [facility] due to AMS, admitted for sepsis due to pneumonia, encephalopathy, sepsis, AKI, hypercalcemia symptomatic anemia and Lytic pulm lesion concern for multiple myeloma.  # AKI on CKD 3a - b/l creat 1.1- 1.5 from 2024, eGFR 53- >60 ml/min. Creat here 5.8 on presentation at OSH and last several days in the 4.0- 4.8 range. AKI due to possible myeloma kidney +/- hyperCa +/- ACEi (home med).  Initially treated with IV fluid and then required Lasix  because of fluid overload.   Started CRRT on 4/23, all 4K bath. UF 50 to 100 cc an hour, no systemic heparin  because of bleeding and hematoma however he will need limited heparin  in the circuit. Continue with strict ins and out and close lab monitoring. Remains anuric and tolerating CRRT well.  Continue current prescription.  Discussed with ICU nurse.  # Multiple myeloma: Oncologist started Velcade  and Cytoxan .  Management per oncology team.    # Shock-currently on Levophed  and vasopressin .  Monitor BP.  #Volume -required IV diuretics.   now UF with CRRT.  # Atrial fib-  on IV amio and IV diltiazem .  #Hypercalcemia -  resolved  now. Possible myeloma, w/u in progress, oncology following. SP zometa  here and calcitonin.   #Multifocal PNA - on IV abx  #Anemia -getting PRBC transfusion.  # AMS/acute metabolic encephalopathy.  Concerned about hyperviscosity.  Oncology team and PCCM following.  Started CRRT for azotemia.  # Hyperuricemia: Rasburicase  and dialysis.  # Hypophosphatemia due to CRRT, replete IV phosphorus.  Monitor lab.  Subjective: Seen and examined in ICU.  Tolerating CRRT well with 50 cc an hour UF net negative.  On 2 pressors.  No urine output.  Objective Vital signs in last 24 hours: Vitals:   07/12/23 0630 07/12/23  0645 07/12/23 0700 07/12/23 0800  BP: 113/66 115/64 109/67 106/65  Pulse: 92 92 93 92  Resp: 19 16 18 20   Temp:    (!) 97.5 F (36.4 C)  TempSrc:    Axillary  SpO2: 100% 100% 99% 100%  Weight:      Height:       Weight change:   Intake/Output Summary (Last 24 hours) at 07/12/2023 0820 Last data filed at 07/12/2023 0800 Gross per 24 hour  Intake 3220.98 ml  Output 4783.4 ml  Net -1562.42 ml       Labs: RENAL PANEL Recent Labs  Lab 07/09/23 0504 07/09/23 1631 07/10/23 0411 07/10/23 1625 07/11/23 0408 07/11/23 0934 07/11/23 1637 07/12/23 0443  NA 146* 140 133*  133* 133* 128* 129* 128* 130*  K 4.6 3.9  3.9 4.0  4.0 4.3 4.3 4.1 4.4 4.4  CL 118* 112* 105  104 103 98 99 99 98  CO2 20* 22 22  23 24 24 23 22 23   GLUCOSE 281* 299* 284*  284* 267* 323* 301* 310* 216*  BUN 97* 89* 62*  66* 53* 51* 49* 51* 50*  CREATININE 6.06* 5.00* 3.74*  3.64* 3.24* 2.88* 2.79* 2.66* 2.32*  CALCIUM  7.4* 7.0* 6.9*  6.9* 7.1* 7.0* 6.9* 7.0* 7.3*  MG 2.2 2.2 2.3 2.5*  --   --   --  2.7*  PHOS 3.4 2.9  2.9 3.0  2.8 2.6 2.1*  --  RESULTS UNAVAILABLE DUE TO INTERFERING SUBSTANCE <1.0*  ALBUMIN  <1.5* <1.5* 1.6*  1.7* 1.8* 1.9* 1.8* 2.0* 2.1*    Liver Function Tests: Recent Labs  Lab 07/10/23 0411 07/10/23 1625 07/11/23 0934 07/11/23 1637 07/12/23 0443  AST 21  --  25  --  33  ALT 38  --  43  --  52*  ALKPHOS 42  --  42  --  45  BILITOT 0.5  --  0.4  --  0.5  PROT 11.3*  --  11.8*  --  >12.0*  ALBUMIN  1.6*  1.7*   < > 1.8* 2.0* 2.1*   < > = values in this interval not displayed.   No results for input(s): "LIPASE", "AMYLASE" in the last 168 hours. Recent Labs  Lab 07/08/23 0828 07/11/23 1022 07/12/23 0443  AMMONIA 116* 109* 58*   CBC: Recent Labs    07/09/23 0504 07/09/23 1631 07/10/23 0411 07/11/23 0408 07/12/23 0443  HGB 7.4* 7.3* 7.3* 7.3* 7.0*  MCV 90.7 91.2 91.2 94.2 95.9    Cardiac Enzymes: No results for input(s): "CKTOTAL", "CKMB", "CKMBINDEX",  "TROPONINI" in the last 168 hours. CBG: Recent Labs  Lab 07/11/23 1530 07/11/23 2023 07/12/23 0021 07/12/23 0450 07/12/23 0811  GLUCAP 270* 254* 210* 195* 251*    Iron Studies: No results for input(s): "IRON", "TIBC", "TRANSFERRIN", "FERRITIN" in the last 72 hours. Studies/Results: EEG adult Result Date: 07/11/2023 Arleene Lack, MD     07/11/2023  3:28 PM Patient Name: Frank [name]. MRN: [id number] Epilepsy Attending: Arleene Lack Referring Physician/Provider: Casimiro Cleaves, PA-C Date: 07/11/2023 Duration: 24.55 mins Patient history: 58yo M with ams. EEG to evaluate for seizure Level of alertness:  comatose/ lethargic AEDs during EEG study: VPA Technical aspects: This EEG study was done with scalp electrodes positioned according to the 10-20 International system of electrode placement. Electrical activity was reviewed with band pass filter of 1-70Hz , sensitivity of 7 uV/mm, display speed of 13mm/sec with a 60Hz  notched filter applied as appropriate. EEG data were recorded continuously and digitally stored.  Video monitoring was available and reviewed as appropriate. Description: EEG showed continuous generalized 3-5 hz theta-delta slowing, at times with triphasic morphology.  Hyperventilation and photic stimulation were not performed.   ABNORMALITY - Continuous slow, generalized IMPRESSION: This study is suggestive of moderate to severe encephalopathy, could be secondary to toxic metabolic causes. No seizures or epileptiform discharges were seen throughout the recording. Priyanka O Yadav    Medications: Infusions:  sodium chloride  Stopped (07/12/23 0504)   feeding supplement (VITAL 1.5 CAL) 60 mL/hr at 07/12/23 0800   fentaNYL  infusion INTRAVENOUS 175 mcg/hr (07/12/23 0800)   heparin  10,000 units/ 20 mL infusion syringe 750 Units/hr (07/12/23 0443)   norepinephrine  (LEVOPHED ) Adult infusion 17 mcg/min (07/12/23 0800)   piperacillin -tazobactam (ZOSYN )  IV 12.5 mL/hr at 07/12/23  0800   prismasol  BGK 4/2.5 400 mL/hr at 07/12/23 0215   prismasol  BGK 4/2.5 400 mL/hr at 07/12/23 0215   prismasol  BGK 4/2.5 1,500 mL/hr at 07/12/23 4098   vasopressin  0.03 Units/min (07/12/23 0800)    Scheduled Medications:  sodium chloride    Intravenous Once   amiodarone   200 mg Per NG tube BID   Followed by   [name] ON 07/24/2023] amiodarone   200 mg Per NG tube Daily   epoetin alfa  40,000 Units Subcutaneous QODAY   famotidine   10.4 mg Per Tube Daily   feeding supplement (PROSource TF20)  60 mL Per Tube TID   insulin  aspart  0-20 Units Subcutaneous Q4H   insulin  aspart  5 Units Subcutaneous Q4H  insulin  glargine-yfgn  10 Units Subcutaneous Daily   lactulose   30 g Per Tube TID   nicotine   14 mg Transdermal Daily   mouth rinse  15 mL Mouth Rinse Q2H   pneumococcal 20-valent conjugate vaccine  0.5 mL Intramuscular Tomorrow-1000   sodium chloride  flush  10-40 mL Intracatheter Q12H   valproic  acid  250 mg Per Tube Q6H    have reviewed scheduled and prn medications.  Physical Exam: General: Critically ill looking male, intubated, sedated Heart:RRR, s1s2 nl Lungs: Coarse breath sound bilateral Abdomen:soft, Non-tender, non-distended Extremities:No edema Neurology: Sedated  Clementine Cutting 07/12/2023,8:20 AM  LOS: 9 days

## 2023-07-12 NOTE — Progress Notes (Signed)
 PT Cancellation Note  Patient Details Name: Frank [name]. MRN: [id number] DOB: 1965/04/10   Cancelled Treatment:     PT order received but eval deferred - pt sedated, on vent, and on CRRT.  RN advises order was intended to address pt's splint.  Ortho tech number provided.  PT will sign off at this time.  Please reorder as pt condition warrants.   Tonnya Garbett 07/12/2023, 12:15 PM

## 2023-07-12 NOTE — Plan of Care (Signed)
  Problem: Clinical Measurements: Goal: Ability to maintain clinical measurements within normal limits will improve Outcome: Progressing Goal: Diagnostic test results will improve Outcome: Progressing   Problem: Nutrition: Goal: Adequate nutrition will be maintained Outcome: Progressing   Problem: Elimination: Goal: Will not experience complications related to bowel motility Outcome: Progressing   Problem: Pain Managment: Goal: General experience of comfort will improve and/or be controlled Outcome: Progressing   Problem: Activity: Goal: Risk for activity intolerance will decrease Outcome: Not Progressing   Problem: Coping: Goal: Level of anxiety will decrease Outcome: Not Progressing   Problem: Skin Integrity: Goal: Risk for impaired skin integrity will decrease Outcome: Not Progressing

## 2023-07-12 NOTE — Plan of Care (Signed)

## 2023-07-12 NOTE — Progress Notes (Signed)
 Unfortunately, there is still is very little progress to be made with Mr. Shuey.  He is still on the vent.  He is still getting CRRT.  He is on any pressor support.  He got a second dose of Velcade  yesterday.  I noted that his protein level has been going up.  This will have to be watched closely.  His renal function seems to be improving which is nice to see.  His albumin  is 2.1.  His ammonia is still quite high at 58.  I know that Neurology has seen him.  He had an EEG done.  His white cell count is 9.2.  Hemoglobin 7.0.  Platelet count 146,000.  He may need to be transfused today.  I suspect we may have to think about giving him some ESA.  I am sure that his erythropoietin level is quite low.  His sodium is 130.  Potassium 4.4.  His blood sugar is 216.  BUN 50 creatinine 2.32.  Calcium  7.3 with an albumin  of 2.1.  His phosphorus is less than 1.  I am sure that pharmacy will work on this.  He is still having some low-grade temperatures.  I think his next treatment for chemotherapy will be Tuesday.   His vital signs show temperature of 98.1.  Pulse 92.  Blood pressure 115/64.  Overall, is really no change in his physical exam.   Again, this is truly a working progress.  Hopefully, we will start to see the myeloma respond.  Of note, I did get back to the cytogenetics and the FISH panel.  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  There is a very good treatment for this but I am not sure we can do this in the hospital since it is a antibody treatment.  We will keep supporting him.  Again we will plan to get him 1 unit of blood.  I will also see about getting him on Procrit.  I know the ICU staff are doing a great job with him.  [name], MD  Lina Render 33:6

## 2023-07-13 ENCOUNTER — Inpatient Hospital Stay (HOSPITAL_COMMUNITY)

## 2023-07-13 DIAGNOSIS — J9601 Acute respiratory failure with hypoxia: Secondary | ICD-10-CM | POA: Diagnosis not present

## 2023-07-13 DIAGNOSIS — G9341 Metabolic encephalopathy: Secondary | ICD-10-CM | POA: Diagnosis not present

## 2023-07-13 DIAGNOSIS — C9000 Multiple myeloma not having achieved remission: Secondary | ICD-10-CM | POA: Diagnosis not present

## 2023-07-13 DIAGNOSIS — E119 Type 2 diabetes mellitus without complications: Secondary | ICD-10-CM | POA: Diagnosis not present

## 2023-07-13 LAB — COMPREHENSIVE METABOLIC PANEL WITH GFR
ALT: 63 U/L — ABNORMAL HIGH (ref 0–44)
AST: 35 U/L (ref 15–41)
Albumin: 2.2 g/dL — ABNORMAL LOW (ref 3.5–5.0)
Alkaline Phosphatase: 48 U/L (ref 38–126)
Anion gap: 8 (ref 5–15)
BUN: 57 mg/dL — ABNORMAL HIGH (ref 6–20)
CO2: 23 mmol/L (ref 22–32)
Calcium: 7.3 mg/dL — ABNORMAL LOW (ref 8.9–10.3)
Chloride: 98 mmol/L (ref 98–111)
Creatinine, Ser: 2.47 mg/dL — ABNORMAL HIGH (ref 0.61–1.24)
GFR, Estimated: 29 mL/min — ABNORMAL LOW (ref >60)
Glucose, Bld: 307 mg/dL — ABNORMAL HIGH (ref 70–99)
Potassium: 4.4 mmol/L (ref 3.5–5.1)
Sodium: 129 mmol/L — ABNORMAL LOW (ref 135–145)
Total Bilirubin: 0.5 mg/dL (ref 0.0–1.2)
Total Protein: >12 g/dL — ABNORMAL HIGH (ref 6.5–8.1)

## 2023-07-13 LAB — GLUCOSE, CAPILLARY
Glucose-Capillary: 322 mg/dL — ABNORMAL HIGH (ref 70–99)
Glucose-Capillary: 303 mg/dL — ABNORMAL HIGH (ref 70–99)
Glucose-Capillary: 309 mg/dL — ABNORMAL HIGH (ref 70–99)
Glucose-Capillary: 281 mg/dL — ABNORMAL HIGH (ref 70–99)
Glucose-Capillary: 240 mg/dL — ABNORMAL HIGH (ref 70–99)
Glucose-Capillary: 228 mg/dL — ABNORMAL HIGH (ref 70–99)

## 2023-07-13 LAB — RENAL FUNCTION PANEL
Albumin: 2.2 g/dL — ABNORMAL LOW (ref 3.5–5.0)
Albumin: 2.1 g/dL — ABNORMAL LOW (ref 3.5–5.0)
Anion gap: 7 (ref 5–15)
Anion gap: 9 (ref 5–15)
BUN: 57 mg/dL — ABNORMAL HIGH (ref 6–20)
BUN: 56 mg/dL — ABNORMAL HIGH (ref 6–20)
CO2: 23 mmol/L (ref 22–32)
CO2: 20 mmol/L — ABNORMAL LOW (ref 22–32)
Calcium: 7.3 mg/dL — ABNORMAL LOW (ref 8.9–10.3)
Calcium: 7.4 mg/dL — ABNORMAL LOW (ref 8.9–10.3)
Chloride: 99 mmol/L (ref 98–111)
Chloride: 102 mmol/L (ref 98–111)
Creatinine, Ser: 2.38 mg/dL — ABNORMAL HIGH (ref 0.61–1.24)
Creatinine, Ser: 2.4 mg/dL — ABNORMAL HIGH (ref 0.61–1.24)
GFR, Estimated: 31 mL/min — ABNORMAL LOW (ref >60)
GFR, Estimated: 30 mL/min — ABNORMAL LOW (ref >60)
Glucose, Bld: 308 mg/dL — ABNORMAL HIGH (ref 70–99)
Glucose, Bld: 305 mg/dL — ABNORMAL HIGH (ref 70–99)
Phosphorus: 3 mg/dL (ref 2.5–4.6)
Phosphorus: 3.3 mg/dL (ref 2.5–4.6)
Potassium: 4.4 mmol/L (ref 3.5–5.1)
Potassium: 4.3 mmol/L (ref 3.5–5.1)
Sodium: 129 mmol/L — ABNORMAL LOW (ref 135–145)
Sodium: 131 mmol/L — ABNORMAL LOW (ref 135–145)

## 2023-07-13 LAB — CBC WITH DIFFERENTIAL/PLATELET
Abs Immature Granulocytes: 0.35 10*3/uL — ABNORMAL HIGH (ref 0.00–0.07)
Basophils Absolute: 0 10*3/uL (ref 0.0–0.1)
Basophils Relative: 1 %
Eosinophils Absolute: 0 10*3/uL (ref 0.0–0.5)
Eosinophils Relative: 0 %
HCT: 25 % — ABNORMAL LOW (ref 39.0–52.0)
Hemoglobin: 7.9 g/dL — ABNORMAL LOW (ref 13.0–17.0)
Immature Granulocytes: 6 %
Lymphocytes Relative: 16 %
Lymphs Abs: 0.9 10*3/uL (ref 0.7–4.0)
MCH: 29.3 pg (ref 26.0–34.0)
MCHC: 31.6 g/dL (ref 30.0–36.0)
MCV: 92.6 fL (ref 80.0–100.0)
Monocytes Absolute: 0.2 10*3/uL (ref 0.1–1.0)
Monocytes Relative: 4 %
Neutro Abs: 4.1 10*3/uL (ref 1.7–7.7)
Neutrophils Relative %: 73 %
Platelets: 111 10*3/uL — ABNORMAL LOW (ref 150–400)
RBC: 2.7 MIL/uL — ABNORMAL LOW (ref 4.22–5.81)
RDW: 18.4 % — ABNORMAL HIGH (ref 11.5–15.5)
WBC: 5.6 10*3/uL (ref 4.0–10.5)
nRBC: 9 % — ABNORMAL HIGH (ref 0.0–0.2)

## 2023-07-13 LAB — RASBURICASE - URIC ACID: Uric Acid, Serum: <1.5 mg/dL — ABNORMAL LOW (ref 3.7–8.6)

## 2023-07-13 LAB — APTT: aPTT: 29 s (ref 24–36)

## 2023-07-13 LAB — MAGNESIUM: Magnesium: 3 mg/dL — ABNORMAL HIGH (ref 1.7–2.4)

## 2023-07-13 MED ORDER — SODIUM CHLORIDE 0.9 % IV SOLN: INTRAVENOUS | Status: AC | PRN

## 2023-07-13 MED ORDER — MIDAZOLAM HCL 2 MG/2ML IJ SOLN
1.0000 mg | INTRAMUSCULAR | Status: DC | PRN
Start: 2023-07-13 — End: 2023-07-16
  Administered 2023-07-13: 2 mg via INTRAVENOUS
  Administered 2023-07-14: 1 mg via INTRAVENOUS
  Administered 2023-07-14 (×2): 2 mg via INTRAVENOUS
  Administered 2023-07-15 (×2): 1 mg via INTRAVENOUS
  Filled 2023-07-13 (×7): qty 2

## 2023-07-13 NOTE — Progress Notes (Signed)
 [facility] NEPHROLOGY PROGRESS NOTE  Assessment/ Plan: Pt is a 58 y.o. yo male with past medical history significant for HTN, HLD, type II DM, BPH, seizure who was initially presented at [facility] due to AMS, admitted for sepsis due to pneumonia, encephalopathy, sepsis, AKI, hypercalcemia symptomatic anemia and Lytic pulm lesion concern for multiple myeloma.  # AKI on CKD 3a - b/l creat 1.1- 1.5 from 2024, eGFR 53- >60 ml/min. Creat here 5.8 on presentation at OSH and last several days in the 4.0- 4.8 range. AKI due to possible myeloma kidney +/- hyperCa +/- ACEi (home med).  Initially treated with IV fluid and then required Lasix  because of fluid overload.   Started CRRT on 4/23, all 4K bath. UF 50 to 100 cc an hour, no systemic heparin  because of bleeding and hematoma however he will need limited heparin  in the circuit. Continue with strict ins and out and close lab monitoring. Remains anuric and tolerating CRRT well.  Looks euvolemic to dry therefore we will run CRRT even.  Discussed with ICU nurse  # Multiple myeloma: Oncologist started Velcade  and Cytoxan .  Management per oncology team.    # Shock-currently on Levophed  and vasopressin .  Monitor BP.  #Volume -required IV diuretics.   now UF with CRRT.  # Atrial fib-  on IV amio and IV diltiazem .  #Hypercalcemia -  resolved  now. Possible myeloma, w/u in progress, oncology following. SP zometa  here and calcitonin.   #Multifocal PNA - on IV abx  #Anemia -getting PRBC transfusion.  # AMS/acute metabolic encephalopathy.  Concerned about hyperviscosity.  Oncology team and PCCM following.  Started CRRT for azotemia.  # Hyperuricemia: Rasburicase  and dialysis.  # Hypophosphatemia due to CRRT, replete IV phosphorus.  Monitor lab.  Subjective: Seen and examined in ICU.  Blood pressure soft on 2 pressors.  Remains anuric.  Tolerating CRRT well.   Objective Vital signs in last 24 hours: Vitals:   07/13/23  0700 07/13/23 0715 07/13/23 0748 07/13/23 0800  BP: (!) 180/82   94/64  Pulse: (!) 114 (!) 112  (!) 105  Resp: (!) 21 17  17   Temp:   100.3 F (37.9 C)   TempSrc:   Axillary   SpO2: 96% 96%  98%  Weight:      Height:       Weight change: -1.4 kg  Intake/Output Summary (Last 24 hours) at 07/13/2023 0852 Last data filed at 07/13/2023 0800 Gross per 24 hour  Intake 4164.16 ml  Output 5572 ml  Net -1407.84 ml       Labs: RENAL PANEL Recent Labs  Lab 07/09/23 1631 07/10/23 0411 07/10/23 1625 07/11/23 0408 07/11/23 0934 07/11/23 1637 07/12/23 0443 07/12/23 1632 07/13/23 0505  NA 140 133*  133* 133* 128* 129* 128* 130* 135 129*  129*  K 3.9  3.9 4.0  4.0 4.3 4.3 4.1 4.4 4.4 4.0 4.4  4.4  CL 112* 105  104 103 98 99 99 98 110 98  99  CO2 22 22  23 24 24 23 22 23  19* 23  23  GLUCOSE 299* 284*  284* 267* 323* 301* 310* 216* 278* 307*  308*  BUN 89* 62*  66* 53* 51* 49* 51* 50* 47* 57*  57*  CREATININE 5.00* 3.74*  3.64* 3.24* 2.88* 2.79* 2.66* 2.32* 2.15* 2.47*  2.38*  CALCIUM  7.0* 6.9*  6.9* 7.1* 7.0* 6.9* 7.0* 7.3* 5.9* 7.3*  7.3*  MG 2.2 2.3 2.5*  --   --   --  2.7*  --  3.0*  PHOS 2.9  2.9 3.0  2.8 2.6 2.1*  --  RESULTS UNAVAILABLE DUE TO INTERFERING SUBSTANCE <1.0* 4.0 3.0  ALBUMIN  <1.5* 1.6*  1.7* 1.8* 1.9* 1.8* 2.0* 2.1* 1.7* 2.2*  2.2*    Liver Function Tests: Recent Labs  Lab 07/11/23 0934 07/11/23 1637 07/12/23 0443 07/12/23 1632 07/13/23 0505  AST 25  --  33  --  35  ALT 43  --  52*  --  63*  ALKPHOS 42  --  45  --  48  BILITOT 0.4  --  0.5  --  0.5  PROT 11.8*  --  >12.0*  --  >12.0*  ALBUMIN  1.8*   < > 2.1* 1.7* 2.2*  2.2*   < > = values in this interval not displayed.   No results for input(s): "LIPASE", "AMYLASE" in the last 168 hours. Recent Labs  Lab 07/08/23 0828 07/11/23 1022 07/12/23 0443  AMMONIA 116* 109* 58*   CBC: Recent Labs    07/10/23 0411 07/11/23 0408 07/12/23 0443 07/12/23 0746 07/12/23 1632  07/13/23 0505  HGB 7.3* 7.3* 7.0*  --  8.4* 7.9*  MCV 91.2 94.2 95.9  --   --  92.6  TIBC  --   --   --  308  --   --   IRON  --   --   --  79  --   --     Cardiac Enzymes: No results for input(s): "CKTOTAL", "CKMB", "CKMBINDEX", "TROPONINI" in the last 168 hours. CBG: Recent Labs  Lab 07/12/23 1542 07/12/23 1941 07/12/23 2319 07/13/23 0411 07/13/23 0748  GLUCAP 288* 343* 306* 322* 303*    Iron Studies:  Recent Labs    07/12/23 0746  IRON 79  TIBC 308   Studies/Results: EEG adult Result Date: 07/11/2023 Arleene Lack, MD     07/11/2023  3:28 PM Patient Name: Frank [name]. MRN: [id number] Epilepsy Attending: Arleene Lack Referring Physician/Provider: Casimiro Cleaves, PA-C Date: 07/11/2023 Duration: 24.55 mins Patient history: 58yo M with ams. EEG to evaluate for seizure Level of alertness:  comatose/ lethargic AEDs during EEG study: VPA Technical aspects: This EEG study was done with scalp electrodes positioned according to the 10-20 International system of electrode placement. Electrical activity was reviewed with band pass filter of 1-70Hz , sensitivity of 7 uV/mm, display speed of 67mm/sec with a 60Hz  notched filter applied as appropriate. EEG data were recorded continuously and digitally stored.  Video monitoring was available and reviewed as appropriate. Description: EEG showed continuous generalized 3-5 hz theta-delta slowing, at times with triphasic morphology.  Hyperventilation and photic stimulation were not performed.   ABNORMALITY - Continuous slow, generalized IMPRESSION: This study is suggestive of moderate to severe encephalopathy, could be secondary to toxic metabolic causes. No seizures or epileptiform discharges were seen throughout the recording. Priyanka O Yadav    Medications: Infusions:  feeding supplement (VITAL 1.5 CAL) 60 mL/hr at 07/13/23 0800   fentaNYL  infusion INTRAVENOUS 125 mcg/hr (07/13/23 0800)   heparin  10,000 units/ 20 mL infusion syringe  750 Units/hr (07/13/23 0715)   norepinephrine  (LEVOPHED ) Adult infusion 11 mcg/min (07/13/23 0800)   piperacillin -tazobactam (ZOSYN )  IV 12.5 mL/hr at 07/13/23 0800   prismasol  BGK 4/2.5 400 mL/hr at 07/13/23 0401   prismasol  BGK 4/2.5 400 mL/hr at 07/13/23 0359   prismasol  BGK 4/2.5 1,500 mL/hr at 07/13/23 0610   vasopressin  0.03 Units/min (07/13/23 0800)    Scheduled Medications:  amiodarone   200  mg Per NG tube BID   Followed by   [name] ON 07/24/2023] amiodarone   200 mg Per NG tube Daily   epoetin alfa  40,000 Units Subcutaneous QODAY   famotidine   10.4 mg Per Tube Daily   feeding supplement (PROSource TF20)  60 mL Per Tube TID   insulin  aspart  0-20 Units Subcutaneous Q4H   insulin  aspart  5 Units Subcutaneous Q4H   insulin  glargine-yfgn  10 Units Subcutaneous Daily   lactulose   30 g Per Tube TID   nicotine   14 mg Transdermal Daily   mouth rinse  15 mL Mouth Rinse Q2H   pneumococcal 20-valent conjugate vaccine  0.5 mL Intramuscular Tomorrow-1000   sodium chloride  flush  10-40 mL Intracatheter Q12H   valproic  acid  250 mg Per Tube Q6H    have reviewed scheduled and prn medications.  Physical Exam: General: Critically ill looking male, intubated, sedated Heart:RRR, s1s2 nl Lungs: Coarse breath sound bilateral Abdomen:soft, Non-tender, non-distended Extremities:No edema Neurology: Sedated  Clementine Cutting 07/13/2023,8:52 AM  LOS: 10 days

## 2023-07-13 NOTE — Plan of Care (Signed)
 Patient remains on ventilator, pressors, and CRRT. Patient tolerating tube feeds, foley still has no output, Flexiseal seal puts out large amounts of stool however patient is on lactulose .  Problem: Education: Goal: Knowledge of General Education information will improve Description: Including pain rating scale, medication(s)/side effects and non-pharmacologic comfort measures Outcome: Not Progressing   Problem: Health Behavior/Discharge Planning: Goal: Ability to manage health-related needs will improve Outcome: Not Progressing   Problem: Clinical Measurements: Goal: Ability to maintain clinical measurements within normal limits will improve Outcome: Not Progressing Goal: Will remain free from infection Outcome: Not Progressing Goal: Diagnostic test results will improve Outcome: Not Progressing Goal: Respiratory complications will improve Outcome: Not Progressing Goal: Cardiovascular complication will be avoided Outcome: Not Progressing   Problem: Activity: Goal: Risk for activity intolerance will decrease Outcome: Not Progressing   Problem: Nutrition: Goal: Adequate nutrition will be maintained Outcome: Not Progressing   Problem: Coping: Goal: Level of anxiety will decrease Outcome: Not Progressing   Problem: Elimination: Goal: Will not experience complications related to bowel motility Outcome: Not Progressing Goal: Will not experience complications related to urinary retention Outcome: Not Progressing   Problem: Pain Managment: Goal: General experience of comfort will improve and/or be controlled Outcome: Not Progressing   Problem: Safety: Goal: Ability to remain free from injury will improve Outcome: Not Progressing   Problem: Skin Integrity: Goal: Risk for impaired skin integrity will decrease Outcome: Not Progressing   Problem: Education: Goal: Ability to describe self-care measures that may prevent or decrease complications (Diabetes Survival Skills  Education) will improve Outcome: Not Progressing Goal: Individualized Educational Video(s) Outcome: Not Progressing   Problem: Coping: Goal: Ability to adjust to condition or change in health will improve Outcome: Not Progressing   Problem: Fluid Volume: Goal: Ability to maintain a balanced intake and output will improve Outcome: Not Progressing   Problem: Health Behavior/Discharge Planning: Goal: Ability to identify and utilize available resources and services will improve Outcome: Not Progressing Goal: Ability to manage health-related needs will improve Outcome: Not Progressing   Problem: Metabolic: Goal: Ability to maintain appropriate glucose levels will improve Outcome: Not Progressing   Problem: Nutritional: Goal: Maintenance of adequate nutrition will improve Outcome: Not Progressing Goal: Progress toward achieving an optimal weight will improve Outcome: Not Progressing   Problem: Skin Integrity: Goal: Risk for impaired skin integrity will decrease Outcome: Not Progressing   Problem: Tissue Perfusion: Goal: Adequacy of tissue perfusion will improve Outcome: Not Progressing   Problem: Safety: Goal: Non-violent Restraint(s) Outcome: Not Progressing  .

## 2023-07-13 NOTE — Progress Notes (Signed)
 This RN called the Orthopedic Technician to inquire about a brace on patient's left humerus that has been present since admission. Was told by ortho that according to the chart patient was supposed to go to a follow up appointment in November 2024 to remove the brace but never showed. Notified Dr. Baldwin Levee with the critical care team. He told this RN that it was alright to remove the brace. Brace was removed and skin underneath was assessed. No skin breakdown was present

## 2023-07-13 NOTE — Plan of Care (Signed)
 Patient on ventilator, sedated and on CRRT, no evidence of learning noted.  Problem: Education: Goal: Knowledge of General Education information will improve Description: Including pain rating scale, medication(s)/side effects and non-pharmacologic comfort measures Outcome: Not Progressing   Problem: Health Behavior/Discharge Planning: Goal: Ability to manage health-related needs will improve Outcome: Not Progressing   Problem: Clinical Measurements: Goal: Ability to maintain clinical measurements within normal limits will improve Outcome: Not Progressing Goal: Will remain free from infection Outcome: Not Progressing Goal: Diagnostic test results will improve Outcome: Not Progressing Goal: Respiratory complications will improve Outcome: Not Progressing Goal: Cardiovascular complication will be avoided Outcome: Not Progressing   Problem: Activity: Goal: Risk for activity intolerance will decrease Outcome: Not Progressing   Problem: Nutrition: Goal: Adequate nutrition will be maintained Outcome: Not Progressing   Problem: Coping: Goal: Level of anxiety will decrease Outcome: Not Progressing   Problem: Elimination: Goal: Will not experience complications related to bowel motility Outcome: Not Progressing Goal: Will not experience complications related to urinary retention Outcome: Not Progressing   Problem: Pain Managment: Goal: General experience of comfort will improve and/or be controlled Outcome: Not Progressing   Problem: Safety: Goal: Ability to remain free from injury will improve Outcome: Not Progressing   Problem: Skin Integrity: Goal: Risk for impaired skin integrity will decrease Outcome: Not Progressing   Problem: Education: Goal: Ability to describe self-care measures that may prevent or decrease complications (Diabetes Survival Skills Education) will improve Outcome: Not Progressing Goal: Individualized Educational Video(s) Outcome: Not  Progressing   Problem: Coping: Goal: Ability to adjust to condition or change in health will improve Outcome: Not Progressing   Problem: Fluid Volume: Goal: Ability to maintain a balanced intake and output will improve Outcome: Not Progressing   Problem: Health Behavior/Discharge Planning: Goal: Ability to identify and utilize available resources and services will improve Outcome: Not Progressing Goal: Ability to manage health-related needs will improve Outcome: Not Progressing   Problem: Metabolic: Goal: Ability to maintain appropriate glucose levels will improve Outcome: Not Progressing   Problem: Nutritional: Goal: Maintenance of adequate nutrition will improve Outcome: Not Progressing Goal: Progress toward achieving an optimal weight will improve Outcome: Not Progressing   Problem: Skin Integrity: Goal: Risk for impaired skin integrity will decrease Outcome: Not Progressing   Problem: Tissue Perfusion: Goal: Adequacy of tissue perfusion will improve Outcome: Not Progressing   Problem: Safety: Goal: Non-violent Restraint(s) Outcome: Not Progressing

## 2023-07-13 NOTE — Progress Notes (Signed)
 NAME:  Frank [name]., MRN:  [id number], DOB:  [birth year], LOS: 10 ADMISSION DATE:  07/03/2023, CONSULTATION DATE:  07/06/23 REFERRING MD:  Dr Maury Space, CHIEF COMPLAINT:  Encephalopathy   History of Present Illness:  Asked to see patient for encephalopathy   Transferred from [facility] with 1 week of generalized weakness, altered mentation, decreased appetite Recently being worked up for multiple myeloma.  Transferred to [facility] for oncology evaluation Nephrology consulted for AKI   Background history of obesity, asthma, obstructive sleep apnea on CPAP GERD, hypertension, hyperlipidemia Has been feeling weak for the last few days according to spouse Admitted with concern for sepsis, community-acquired pneumonia, antibiotic encephalopathy Workup revealed lytic bony lesions with concern for multiple myeloma Transfused with 3 units packed red blood cells Evaluation so far-bone marrow 07/04/2023  Pertinent  Medical History   Past Medical History:  Diagnosis Date   Anxiety    Arthritis    Asthma    Bipolar disorder (HCC)    Current every day smoker    Depression    Diabetes mellitus, type II (HCC)    Dyspnea    History of kidney stones    History of pneumonia    Hyperlipidemia    Hypertension    Morbid obesity (HCC)    Sedentary lifestyle    Seizures (HCC)    last seizure 12 yrs ago, no current problem   Sleep apnea    uses CPAP nightly   Significant Hospital Events: Including procedures, antibiotic start and stop dates in addition to other pertinent events   4/19-blood cultures, MRSA PCR negative 4/19 chest x-ray with mild pulmonary congestion 4/20 atrial fibrillation with RVR 4/21 nosebleed after IR NG attempt, NG placed by PCCM in afternoon, stable transferred to [facility] 4/22 intubated; some abla requiring prbcs; ct abd/pelvis showing acute groin hematoma tracking from posterior presumed from site of bone marrow biopsy; heparin  held EEG 4/25 >> moderate to severe  encephalopathy without any seizures or epileptiform discharges 4/23 respiratory culture >> MRSE  Interim History / Subjective:  Remains on CVVH, now keeping even.  I/O- 4.1 L total Remains on pressors: Norepinephrine  11, vasopressin  0.03 Fentanyl  125 Total protein remains >12  Objective   Blood pressure (!) 154/78, pulse 100, temperature 100.3 F (37.9 C), temperature source Axillary, resp. rate 15, height 6' 0.01" (1.829 m), weight 96.6 kg, SpO2 97%.    Vent Mode: PRVC FiO2 (%):  [30 %] 30 % Set Rate:  [20 bmp] 20 bmp Vt Set:  [650 mL] 650 mL PEEP:  [5 cmH20] 5 cmH20 Plateau Pressure:  [13 cmH20-23 cmH20] 17 cmH20   Intake/Output Summary (Last 24 hours) at 07/13/2023 6962 Last data filed at 07/13/2023 0900 Gross per 24 hour  Intake 4153.47 ml  Output 5513 ml  Net -1359.53 ml   Filed Weights   07/11/23 0500 07/12/23 0700 07/13/23 0500  Weight: 97.9 kg 98 kg 96.6 kg    Examination: General: Critically ill-appearing man, ventilated HEENT: ET tube in good position, no secretions Neuro: Lethargic but then wakes to voice, follows commands weakly CV: Distant, no murmur PULM: Decreased at both bases but clear, no wheezing or crackles GI: Nondistended with positive bowel sounds Extremities: No change in right groin hematoma, has not expanded outside the marked perimeter Skin: No rash   Resolved Hospital Problem list   Hypercalcemia-resolved  Assessment & Plan:  Acute hypoxemic respiratory failure: Most likely in the setting of encephalopathy and aspiration of oropharyngeal secretions with appearance of bloody frothy secretions  through trachea during intubation.  Also possible combination of volume overload in setting of renal failure and blood products, increased fluid load from chemotherapy etc. Plan: -Continue to work on weaning sedation.  Hopefully will be candidate for SBT as he wakes up, stabilizes hemodynamically - Continue PRVC 8 cc/kg baseline settings - VAP  prevention orders, pulmonary hygiene - Remains on Zosyn  for possible aspiration pneumonia.  Respiratory culture has few MRSE, unclear whether this is a pathogen versus contaminant but if he does not make progress would consider adding vancomycin    Multiple myeloma Suspected tumor lysis - Presented with encephalopathy, AKI, hypocalcemia, lytic bone lesions, Elevated immunoglobulins.  Protein remains >12 - Bone marrow results consistent with myeloma Plan: -Appreciate hematology/oncology assistance.  Received Velcade  4/25, next chemotherapy planned for 4/29 -Following tumor lysis labs   Paroxysmal atrial fibrillation with RVR: converted to ST 4/22 AM Plan: -Anticoagulation on hold - Continue amiodarone  - Appreciate cardiology assistance  AKI: Creatinine, BUN slowly rising but urine output appears to be picking up which is encouraging -started CRRT 4/23 Plan: -Appreciate nephrology management on CVVH.  Agree with changing UF to keep I=O given his hemodynamics - Remains an uric, following - Follow BMP, electrolytes   Fever: Related to pneumonia, possible myeloma. Plan: -Continue Zosyn .  Consider treatment respiratory MRSE as above depending on trend   ABLA from right groin hematoma: Related to myeloma, lab checks, prior hydration with hypercalcemia, hematoma tracks from site of bone marrow biopsy down to thigh on CT. Hgb now stable.  Plan: Off anticoagulation - Following intermittent CBC - Transfusion goal hemoglobin 7.0   Acute metabolic encephalopathy - Multifactorial, sepsis, hypercalcemia, myeloma Hyperammonemia History of seizure disorder Plan: -No evidence seizures on EEG 4/25.  Plan continue Depakote  as ordered - Minimize sedation - Following intermittent ammonia.  Plan to dose lactulose  if rising, held now for increase stool output   Shock: sedation related, hypovolemia questioned as increase pressors with UF via CRRT; possible sepsis Plan: -Continue to work to wean  norepinephrine , MAP goal 65 - Keeping even with CVVH - Empiric antibiotics as above   History of obstructive sleep apnea Plan: - CPAP at night if improves   Hypoalbuminemia Plan: -Tube feeding RD   T2DM Plan: Continue current sliding scale insulin , TF coverage, basal insulin   Depression/anxiety Plan: - Continue to hold buspar , paxil , klonopin; hold wellbutrin (with hx of seizure disorder should likely not be restarted)   Peripheral neuropathy Plan: - Continue to hold gabapentin    Best Practice (right click and "Reselect all SmartList Selections" daily)   Diet/type: tubefeeds DVT prophylaxis SCD Pressure ulcer(s): N/A GI prophylaxis: H2B Lines: Central line Foley:  Yes, and it is still needed Code Status:  full code Last date of multidisciplinary goals of care discussion [4/23 wife updated over phone]  Labs   CBC: Recent Labs  Lab 07/09/23 1631 07/10/23 0411 07/11/23 0408 07/12/23 0443 07/12/23 1632 07/13/23 0505  WBC 12.4* 12.2* 8.6 9.2  --  5.6  NEUTROABS 8.0* 10.4* 6.5 6.9  --  4.1  HGB 7.3* 7.3* 7.3* 7.0* 8.4* 7.9*  HCT 23.7* 23.8* 24.3* 23.4* 27.1* 25.0*  MCV 91.2 91.2 94.2 95.9  --  92.6  PLT 100* 112* 115* 146*  --  111*    Basic Metabolic Panel: Recent Labs  Lab 07/09/23 1631 07/10/23 0411 07/10/23 1625 07/11/23 0408 07/11/23 0934 07/11/23 1637 07/12/23 0443 07/12/23 1632 07/13/23 0505  NA 140 133*  133* 133* 128* 129* 128* 130* 135 129*  129*  K  3.9  3.9 4.0  4.0 4.3 4.3 4.1 4.4 4.4 4.0 4.4  4.4  CL 112* 105  104 103 98 99 99 98 110 98  99  CO2 22 22  23 24 24 23 22 23  19* 23  23  GLUCOSE 299* 284*  284* 267* 323* 301* 310* 216* 278* 307*  308*  BUN 89* 62*  66* 53* 51* 49* 51* 50* 47* 57*  57*  CREATININE 5.00* 3.74*  3.64* 3.24* 2.88* 2.79* 2.66* 2.32* 2.15* 2.47*  2.38*  CALCIUM  7.0* 6.9*  6.9* 7.1* 7.0* 6.9* 7.0* 7.3* 5.9* 7.3*  7.3*  MG 2.2 2.3 2.5*  --   --   --  2.7*  --  3.0*  PHOS 2.9  2.9 3.0  2.8 2.6 2.1*   --  RESULTS UNAVAILABLE DUE TO INTERFERING SUBSTANCE <1.0* 4.0 3.0   GFR: Estimated Creatinine Clearance: 38.8 mL/min (A) (by C-G formula based on SCr of 2.47 mg/dL (H)). Recent Labs  Lab 07/08/23 0828 07/09/23 0504 07/10/23 0411 07/11/23 0408 07/12/23 0443 07/13/23 0505  PROCALCITON 0.54  --   --   --   --   --   WBC 15.7*   < > 12.2* 8.6 9.2 5.6   < > = values in this interval not displayed.    Liver Function Tests: Recent Labs  Lab 07/09/23 0504 07/09/23 1631 07/10/23 0411 07/10/23 1625 07/11/23 0934 07/11/23 1637 07/12/23 0443 07/12/23 1632 07/13/23 0505  AST 27  --  21  --  25  --  33  --  35  ALT 25  --  38  --  43  --  52*  --  63*  ALKPHOS 43  --  42  --  42  --  45  --  48  BILITOT 0.6  --  0.5  --  0.4  --  0.5  --  0.5  PROT 10.4*  --  11.3*  --  11.8*  --  >12.0*  --  >12.0*  ALBUMIN  <1.5*   < > 1.6*  1.7*   < > 1.8* 2.0* 2.1* 1.7* 2.2*  2.2*   < > = values in this interval not displayed.   No results for input(s): "LIPASE", "AMYLASE" in the last 168 hours. Recent Labs  Lab 07/07/23 0339 07/08/23 0828 07/11/23 1022 07/12/23 0443  AMMONIA 70* 116* 109* 58*    ABG    Component Value Date/Time   PHART 7.33 (L) 07/08/2023 1252   PCO2ART 47 07/08/2023 1252   PO2ART 369 (H) 07/08/2023 1252   HCO3 24.3 07/08/2023 1252   TCO2 28 01/29/2023 1044   ACIDBASEDEF 1.5 07/08/2023 1252   O2SAT 100 07/08/2023 1252     Coagulation Profile: Recent Labs  Lab 07/07/23 2030  INR 1.9*    Cardiac Enzymes: No results for input(s): "CKTOTAL", "CKMB", "CKMBINDEX", "TROPONINI" in the last 168 hours.  HbA1C: Hgb A1c MFr Bld  Date/Time Value Ref Range Status  01/29/2023 02:40 PM 6.7 (H) 4.8 - 5.6 % Final    Comment:    (NOTE) Pre diabetes:          5.7%-6.4%  Diabetes:              >6.4%  Glycemic control for   <7.0% adults with diabetes   11/12/2021 11:30 AM 6.5 (H) 4.8 - 5.6 % Final    Comment:    (NOTE) Pre diabetes:           5.7%-6.4%  Diabetes:              >  6.4%  Glycemic control for   <7.0% adults with diabetes     CBG: Recent Labs  Lab 07/12/23 1542 07/12/23 1941 07/12/23 2319 07/13/23 0411 07/13/23 0748  GLUCAP 288* 343* 306* 322* 303*    CRITICAL CARE Performed by: Denson Flake  Total critical care time: 33 minutes   Racheal Buddle, MD, PhD 07/13/2023, 9:37 AM [facility] [phone] or if no answer before 7:00PM call [phone] For any issues after 7:00PM please call eLink [phone]

## 2023-07-14 ENCOUNTER — Encounter (HOSPITAL_COMMUNITY): Payer: Self-pay | Admitting: Hematology & Oncology

## 2023-07-14 DIAGNOSIS — G9340 Encephalopathy, unspecified: Secondary | ICD-10-CM | POA: Insufficient documentation

## 2023-07-14 DIAGNOSIS — A419 Sepsis, unspecified organism: Secondary | ICD-10-CM

## 2023-07-14 DIAGNOSIS — J9601 Acute respiratory failure with hypoxia: Secondary | ICD-10-CM | POA: Diagnosis not present

## 2023-07-14 DIAGNOSIS — R579 Shock, unspecified: Secondary | ICD-10-CM

## 2023-07-14 DIAGNOSIS — R531 Weakness: Secondary | ICD-10-CM | POA: Diagnosis not present

## 2023-07-14 DIAGNOSIS — C9000 Multiple myeloma not having achieved remission: Secondary | ICD-10-CM | POA: Diagnosis not present

## 2023-07-14 DIAGNOSIS — N179 Acute kidney failure, unspecified: Secondary | ICD-10-CM | POA: Diagnosis not present

## 2023-07-14 LAB — RASBURICASE - URIC ACID: Uric Acid, Serum: 1.9 mg/dL — ABNORMAL LOW (ref 3.7–8.6)

## 2023-07-14 LAB — COMPREHENSIVE METABOLIC PANEL WITH GFR
ALT: 50 U/L — ABNORMAL HIGH (ref 0–44)
AST: 19 U/L (ref 15–41)
Albumin: 2.2 g/dL — ABNORMAL LOW (ref 3.5–5.0)
Alkaline Phosphatase: 48 U/L (ref 38–126)
Anion gap: 8 (ref 5–15)
BUN: 54 mg/dL — ABNORMAL HIGH (ref 6–20)
CO2: 23 mmol/L (ref 22–32)
Calcium: 7.7 mg/dL — ABNORMAL LOW (ref 8.9–10.3)
Chloride: 98 mmol/L (ref 98–111)
Creatinine, Ser: 2.35 mg/dL — ABNORMAL HIGH (ref 0.61–1.24)
GFR, Estimated: 31 mL/min — ABNORMAL LOW (ref >60)
Glucose, Bld: 249 mg/dL — ABNORMAL HIGH (ref 70–99)
Potassium: 4.8 mmol/L (ref 3.5–5.1)
Sodium: 129 mmol/L — ABNORMAL LOW (ref 135–145)
Total Bilirubin: 0.6 mg/dL (ref 0.0–1.2)
Total Protein: >12 g/dL — ABNORMAL HIGH (ref 6.5–8.1)

## 2023-07-14 LAB — CBC WITH DIFFERENTIAL/PLATELET
Abs Immature Granulocytes: 0 10*3/uL (ref 0.00–0.07)
Band Neutrophils: 0 %
Basophils Absolute: 0 10*3/uL (ref 0.0–0.1)
Basophils Relative: 1 %
Blasts: 0 %
Eosinophils Absolute: 0 10*3/uL (ref 0.0–0.5)
Eosinophils Relative: 0 %
HCT: 27.4 % — ABNORMAL LOW (ref 39.0–52.0)
Hemoglobin: 8.3 g/dL — ABNORMAL LOW (ref 13.0–17.0)
Immature Granulocytes: 0 %
Lymphocytes Relative: 21 %
Lymphs Abs: 1 10*3/uL (ref 0.7–4.0)
MCH: 28.6 pg (ref 26.0–34.0)
MCHC: 30.3 g/dL (ref 30.0–36.0)
MCV: 94.5 fL (ref 80.0–100.0)
Metamyelocytes Relative: 0 %
Monocytes Absolute: 0.3 10*3/uL (ref 0.1–1.0)
Monocytes Relative: 7 %
Myelocytes: 0 %
Neutro Abs: 3.3 10*3/uL (ref 1.7–7.7)
Neutrophils Relative %: 71 %
Other: 0 %
Platelets: 125 10*3/uL — ABNORMAL LOW (ref 150–400)
Promyelocytes Relative: 0 %
RBC: 2.9 MIL/uL — ABNORMAL LOW (ref 4.22–5.81)
RDW: 18.1 % — ABNORMAL HIGH (ref 11.5–15.5)
WBC: 4.6 10*3/uL (ref 4.0–10.5)
nRBC: 0 /100{WBCs}
nRBC: 29 % — ABNORMAL HIGH (ref 0.0–0.2)

## 2023-07-14 LAB — BLOOD GAS, ARTERIAL
Acid-base deficit: 1.7 mmol/L (ref 0.0–2.0)
Bicarbonate: 21.9 mmol/L (ref 20.0–28.0)
Drawn by: 56037
FIO2: 30 %
MECHVT: 650 mL
O2 Saturation: 99.8 %
PEEP: 5 cmH2O
Patient temperature: 38.7
RATE: 20 {breaths}/min
pCO2 arterial: 36 mmHg (ref 32–48)
pH, Arterial: 7.4 (ref 7.35–7.45)
pO2, Arterial: 129 mmHg — ABNORMAL HIGH (ref 83–108)

## 2023-07-14 LAB — HEMOGLOBIN AND HEMATOCRIT, BLOOD
HCT: 27 % — ABNORMAL LOW (ref 39.0–52.0)
Hemoglobin: 8.2 g/dL — ABNORMAL LOW (ref 13.0–17.0)

## 2023-07-14 LAB — RENAL FUNCTION PANEL
Albumin: 2.1 g/dL — ABNORMAL LOW (ref 3.5–5.0)
Albumin: 2.2 g/dL — ABNORMAL LOW (ref 3.5–5.0)
Anion gap: 8 *Deleted (ref 5–15)
Anion gap: 10 *Deleted (ref 5–15)
BUN: 57 mg/dL — ABNORMAL HIGH (ref 6–20)
BUN: 55 mg/dL — ABNORMAL HIGH (ref 6–20)
CO2: 22 mmol/L (ref 22–32)
CO2: 21 mmol/L — ABNORMAL LOW (ref 22–32)
Calcium: 7.7 mg/dL — ABNORMAL LOW (ref 8.9–10.3)
Calcium: 7.4 mg/dL — ABNORMAL LOW (ref 8.9–10.3)
Chloride: 98 mmol/L (ref 98–111)
Chloride: 99 mmol/L (ref 98–111)
Creatinine, Ser: 2.43 mg/dL — ABNORMAL HIGH (ref 0.61–1.24)
Creatinine, Ser: 2.24 mg/dL — ABNORMAL HIGH (ref 0.61–1.24)
GFR, Estimated: 30 mL/min — ABNORMAL LOW (ref >60)
GFR, Estimated: 33 mL/min — ABNORMAL LOW (ref >60)
Glucose, Bld: 251 mg/dL — ABNORMAL HIGH (ref 70–99)
Glucose, Bld: 215 mg/dL — ABNORMAL HIGH (ref 70–99)
Phosphorus: <1 mg/dL — CL (ref 2.5–4.6)
Phosphorus: <1 mg/dL — CL (ref 2.5–4.6)
Potassium: 4.8 mmol/L (ref 3.5–5.1)
Potassium: 4.8 mmol/L (ref 3.5–5.1)
Sodium: 128 mmol/L — ABNORMAL LOW (ref 135–145)
Sodium: 130 mmol/L — ABNORMAL LOW (ref 135–145)

## 2023-07-14 LAB — GLUCOSE, CAPILLARY
Glucose-Capillary: 202 mg/dL — ABNORMAL HIGH (ref 70–99)
Glucose-Capillary: 227 mg/dL — ABNORMAL HIGH (ref 70–99)
Glucose-Capillary: 242 mg/dL — ABNORMAL HIGH (ref 70–99)
Glucose-Capillary: 245 mg/dL — ABNORMAL HIGH (ref 70–99)
Glucose-Capillary: 260 mg/dL — ABNORMAL HIGH (ref 70–99)
Glucose-Capillary: 282 mg/dL — ABNORMAL HIGH (ref 70–99)

## 2023-07-14 LAB — BPAM RBC
Blood Product Expiration Date: 202505272359
ISSUE DATE / TIME: 202504261110
Unit Type and Rh: 6200

## 2023-07-14 LAB — AMMONIA: Ammonia: 87 umol/L — ABNORMAL HIGH (ref 9–35)

## 2023-07-14 LAB — TYPE AND SCREEN
ABO/RH(D): A POS
Antibody Screen: NEGATIVE
Unit division: 0

## 2023-07-14 LAB — APTT: aPTT: 28 s (ref 24–36)

## 2023-07-14 LAB — MAGNESIUM: Magnesium: 2.9 mg/dL — ABNORMAL HIGH (ref 1.7–2.4)

## 2023-07-14 MED ORDER — INSULIN GLARGINE-YFGN 100 UNIT/ML ~~LOC~~ SOLN
15.0000 [IU] | Freq: Every day | SUBCUTANEOUS | Status: DC
  Filled 2023-07-14: qty 0.15

## 2023-07-14 MED ORDER — DEXMEDETOMIDINE HCL IN NACL 400 MCG/100ML IV SOLN
0.0000 ug/kg/h | INTRAVENOUS | Status: DC
  Administered 2023-07-14: 0.4 ug/kg/h via INTRAVENOUS
  Administered 2023-07-14: 1.1 ug/kg/h via INTRAVENOUS
  Administered 2023-07-15 (×5): 1.2 ug/kg/h via INTRAVENOUS
  Administered 2023-07-16 (×2): 1.4 ug/kg/h via INTRAVENOUS
  Administered 2023-07-16: 1.2 ug/kg/h via INTRAVENOUS
  Administered 2023-07-16 (×2): 1.4 ug/kg/h via INTRAVENOUS
  Administered 2023-07-16: 1.2 ug/kg/h via INTRAVENOUS
  Administered 2023-07-17: 1.3 ug/kg/h via INTRAVENOUS
  Administered 2023-07-17 (×5): 1.4 ug/kg/h via INTRAVENOUS
  Administered 2023-07-17: 0.8 ug/kg/h via INTRAVENOUS
  Administered 2023-07-18: 1.2 ug/kg/h via INTRAVENOUS
  Administered 2023-07-21 (×2): 1 ug/kg/h via INTRAVENOUS
  Administered 2023-07-21: 0.4 ug/kg/h via INTRAVENOUS
  Administered 2023-07-22: 1.1 ug/kg/h via INTRAVENOUS
  Administered 2023-07-22: 1 ug/kg/h via INTRAVENOUS
  Administered 2023-07-22: 0.5 ug/kg/h via INTRAVENOUS
  Administered 2023-07-22 (×2): 1 ug/kg/h via INTRAVENOUS
  Administered 2023-07-23: 0.5 ug/kg/h via INTRAVENOUS
  Administered 2023-07-23 (×2): 1.2 ug/kg/h via INTRAVENOUS
  Administered 2023-07-23: 1 ug/kg/h via INTRAVENOUS
  Administered 2023-07-24 (×9): 1.3 ug/kg/h via INTRAVENOUS
  Filled 2023-07-14 (×42): qty 100

## 2023-07-14 MED ORDER — VANCOMYCIN HCL IN DEXTROSE 1-5 GM/200ML-% IV SOLN
1000.0000 mg | INTRAVENOUS | Status: DC
  Administered 2023-07-15 – 2023-07-21 (×7): 1000 mg via INTRAVENOUS
  Filled 2023-07-14 (×7): qty 200

## 2023-07-14 MED ORDER — SODIUM PHOSPHATES 45 MMOLE/15ML IV SOLN
30.0000 mmol | Freq: Once | INTRAVENOUS | Status: AC
  Administered 2023-07-14: 30 mmol via INTRAVENOUS
  Filled 2023-07-14: qty 10

## 2023-07-14 MED ORDER — INSULIN GLARGINE-YFGN 100 UNIT/ML ~~LOC~~ SOLN
10.0000 [IU] | Freq: Once | SUBCUTANEOUS | Status: AC
  Administered 2023-07-14: 10 [IU] via SUBCUTANEOUS
  Filled 2023-07-14: qty 0.1

## 2023-07-14 MED ORDER — POLYVINYL ALCOHOL 1.4 % OP SOLN
1.0000 [drp] | OPHTHALMIC | Status: DC | PRN
Start: 2023-07-14 — End: 2023-11-07
  Administered 2023-07-15 – 2023-07-18 (×3): 1 [drp] via OPHTHALMIC
  Filled 2023-07-14: qty 15

## 2023-07-14 MED ORDER — VANCOMYCIN HCL 1750 MG/350ML IV SOLN
1750.0000 mg | Freq: Once | INTRAVENOUS | Status: AC
  Administered 2023-07-14: 1750 mg via INTRAVENOUS
  Filled 2023-07-14: qty 350

## 2023-07-14 MED ORDER — INSULIN GLARGINE-YFGN 100 UNIT/ML ~~LOC~~ SOPN
10.0000 [IU] | PEN_INJECTOR | Freq: Once | SUBCUTANEOUS | Status: DC
  Filled 2023-07-14: qty 3

## 2023-07-14 MED ORDER — FENTANYL 2500MCG IN NS 250ML (10MCG/ML) PREMIX INFUSION: 0.0000 ug/h | INTRAVENOUS | Status: DC

## 2023-07-14 NOTE — Progress Notes (Signed)
 He is still on the vent.  I think he is still on pressor support.  He is still on dialysis.  It is hard to say how much progress we are making although I think he is going 1 pressor now would not to pressors. He is getting feeds through the feeding tube.  Labs are not back yet today.  He will get chemotherapy tomorrow.  He will get Velcade  and Cytoxan .  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  Typically, we have to use monoclonal antibodies for this.  However, I do not think we can do this in the hospital because of rules with respect to payment.  I know the staff in the ICU are working hard on him.  Hopefully, his progress will be very slow but steady.  His vital signs are all relatively stable.  He is still little bit tachycardic.  His temperature is 98.5.  Pulse 120.  Blood pressure 163/79.  Again I would think he should be off pressors with blood pressure that good.  Again, we does have to await the chemotherapy to start working.  Typically, this protocol is about 90% effective.  We will continue to follow along and help out.  At least, his calcium  is not bad.  [name], MD  Hebrew[place] 11:1

## 2023-07-14 NOTE — Progress Notes (Signed)
 [facility] NEPHROLOGY PROGRESS NOTE  Assessment/ Plan: Pt is a 58 y.o. yo male with past medical history significant for HTN, HLD, type II DM, BPH, seizure who was initially presented at [facility] due to AMS, admitted for sepsis due to pneumonia, encephalopathy, sepsis, AKI, hypercalcemia symptomatic anemia and Lytic pulm lesion concern for multiple myeloma.  # AKI on CKD 3a - b/l creat 1.1- 1.5 from 2024, eGFR 53- >60 ml/min. Creat here 5.8 on presentation at OSH and last several days in the 4.0- 4.8 range. AKI due to possible myeloma kidney +/- hyperCa +/- ACEi (home med).  Initially treated with IV fluid and then required Lasix  because of fluid overload.   Started CRRT on 4/23, all 4K bath. UF 50 to 100 cc an hour, no systemic heparin  because of bleeding and hematoma however he will need limited heparin  in the circuit Remains anuric and tolerating CRRT well.  Looks euvolemic to dry - will run CRRT even.   # Multiple myeloma: Oncologist started Velcade  and Cytoxan .  Management per oncology team.    # Shock-currently on Levophed  and vasopressin .  Monitor BP.  #Volume -required IV diuretics, then UF with CRRT now is stable, keeping even.  # Atrial fib-  on IV amio and IV diltiazem .  #Hypercalcemia -  resolved  now. Per oncology.   #Multifocal PNA - on IV abx  #Anemia -getting PRBC transfusion.  # AMS/acute metabolic encephalopathy.  Concerned about hyperviscosity.  Oncology team and PCCM following.  Started CRRT for azotemia.  # Hyperuricemia: Rasburicase  and dialysis.  # Hypophosphatemia due to CRRT, repleted IV phosphorus.  Monitor lab.  Subjective: Seen and examined in ICU.  Blood pressure soft on 1 pressor.  Remains anuric.  Tolerating CRRT well.   Objective Vital signs in last 24 hours: Vitals:   07/14/23 1330 07/14/23 1345 07/14/23 1400 07/14/23 1610  BP: 127/77 (!) 145/81 133/84   Pulse:    (!) 119  Resp: 19 (!) 22 15 20   Temp:      TempSrc:       SpO2:   99% 98%  Weight:      Height:        Physical Exam: General: Critically ill looking male, intubated, sedated Heart:RRR, s1s2 nl Lungs: Coarse breath sound bilateral Abdomen:soft, Non-tender, non-distended Extremities:No edema Neurology: [name]  MD  [facility] 07/14/2023, 4:36 PM  Recent Labs  Lab 07/13/23 0505 07/13/23 1728 07/14/23 0524  HGB 7.9*  --  8.3*  ALBUMIN  2.2*  2.2* 2.1* 2.1*  2.2*  CALCIUM  7.3*  7.3* 7.4* 7.7*  7.7*  PHOS 3.0 3.3 <1.0*  CREATININE 2.47*  2.38* 2.40* 2.43*  2.35*  K 4.4  4.4 4.3 4.8  4.8    Inpatient medications:  amiodarone   200 mg Per NG tube BID   Followed by   [name] ON 07/24/2023] amiodarone   200 mg Per NG tube Daily   epoetin alfa  40,000 Units Subcutaneous QODAY   famotidine   10.4 mg Per Tube Daily   feeding supplement (PROSource TF20)  60 mL Per Tube TID   insulin  aspart  0-20 Units Subcutaneous Q4H   insulin  aspart  5 Units Subcutaneous Q4H   [START ON 07/15/2023] insulin  glargine-yfgn  15 Units Subcutaneous Daily   lactulose   30 g Per Tube TID   nicotine   14 mg Transdermal Daily   mouth rinse  15 mL Mouth Rinse Q2H   pneumococcal 20-valent conjugate vaccine  0.5 mL Intramuscular Tomorrow-1000  sodium chloride  flush  10-40 mL Intracatheter Q12H    sodium chloride  Stopped (07/14/23 1449)   feeding supplement (VITAL 1.5 CAL) 60 mL/hr at 07/14/23 1600   fentaNYL  infusion INTRAVENOUS 125 mcg/hr (07/14/23 1600)   heparin  10,000 units/ 20 mL infusion syringe 750 Units/hr (07/14/23 0952)   norepinephrine  (LEVOPHED ) Adult infusion 4 mcg/min (07/14/23 1600)   piperacillin -tazobactam (ZOSYN )  IV 12.5 mL/hr at 07/14/23 1600   prismasol  BGK 4/2.5 400 mL/hr at 07/14/23 0557   prismasol  BGK 4/2.5 400 mL/hr at 07/14/23 0557   prismasol  BGK 4/2.5 1,500 mL/hr at 07/14/23 1343   [START ON 07/15/2023] vancomycin      vasopressin  Stopped (07/13/23 1311)   sodium chloride , acetaminophen  (TYLENOL ) oral liquid 160 mg/5 mL  **OR** acetaminophen , fentaNYL  (SUBLIMAZE ) injection, fentaNYL  (SUBLIMAZE ) injection, heparin , LORazepam , midazolam , mouth rinse, oxyCODONE , polyethylene glycol, sodium chloride  flush

## 2023-07-14 NOTE — Plan of Care (Signed)
  Problem: Clinical Measurements: Goal: Cardiovascular complication will be avoided Outcome: Progressing   Problem: Nutrition: Goal: Adequate nutrition will be maintained Outcome: Progressing   Problem: Nutritional: Goal: Maintenance of adequate nutrition will improve Outcome: Progressing

## 2023-07-14 NOTE — Progress Notes (Addendum)
 eLink Physician-Brief Progress Note Patient Name: Frank [name]. DOB: 01-Oct-1965 MRN: [id number]   Date of Service  07/14/2023  HPI/Events of Note  Epic alert.  Camera: Discussed with RN. On CRRT, all day neutral fluid balance, volume overload. MAP < 65. On levophed . Vasopressin  is off .  In synchrony with Vent. Sats ok.  Multiple myeloma, on chemotherapy.  CRRT Septic shock. On Vent.    eICU Interventions  Resume Vasopressin . Get ABG and Hg stat     Intervention Category Major Interventions: Hypotension - evaluation and management Minor Interventions: Communication with other healthcare providers and/or family  Rexann Catalan 07/14/2023, 7:48 PM  21:02 Bedside RN asking for order for artificial tears for this patient-ordered Hg at 8.2, stable  21:45 Bedside RN just asking to renew Fent gtt as it expires around 0100 tonight.  Renewed.  06:18 High alert Camera: Hypotension, MAP low. In synchrony with vent, PIP 22. Sats ok. On Levo upped already to 20, on vaso. On CRRT. No bleeding.  Hg stable > 8. Labs are ok.  Septic shock. Pneumonia.  Multiple myeloma.chemo.  Anemia. AKI on CRRT. EF normal.  - stat Neo stick 100 mcg, 100 mg sod bicarb. - start neo synephrine drip. - get ABG. - 500 ml bolus. Stat.  Notified Dr Judie Noun, CCM ground team and called patient wifMichelle. She is coming to ICU soon. Prognosis guarded.

## 2023-07-14 NOTE — Progress Notes (Signed)
 Pharmacy Antibiotic Note  Frank [name]. is a 58 y.o. male admitted on 07/03/2023 with AMS > hypercalcemia of malignancy. NEW MM diagnosis as a transfer from OSH.  Pharmacy has been consulted for Vanco dosing.  ID: Asp PNA (was on abx at [facility]). MRSE pathogenic vs contaminant? -Febrile on CAP tx, Tmax 101.9 ?tumor fever? WBC WNL  4/22 Zosyn  >> 4/28: Vanco>> Azith x3 days (completed 4/20) 4/17 CTX (CAP)  >> 4/22 4/18 Fluconazole  > 4/23 (indication?) - stopped per d/w CCM  Micro:  - 4/19: MRSA PCR: neg -4/19 UCx: ngF -4/19 BCx: Neg -4/23 TA: few Staph epi: MRSE  Plan: - Vanco 1750mg  IV load then 1000mg /24h CRRT dosing    Height: 6' 0.01" (182.9 cm) Weight: 93.6 kg (206 lb 5.6 oz) IBW/kg (Calculated) : 77.62  Temp (24hrs), Avg:98.5 F (36.9 C), Min:97.4 F (36.3 C), Max:101.9 F (38.8 C)  Recent Labs  Lab 07/10/23 0411 07/10/23 1625 07/11/23 0408 07/11/23 0934 07/12/23 0443 07/12/23 1632 07/13/23 0505 07/13/23 1728 07/14/23 0524  WBC 12.2*  --  8.6  --  9.2  --  5.6  --  4.6  CREATININE 3.74*  3.64*   < > 2.88*   < > 2.32* 2.15* 2.47*  2.38* 2.40* 2.43*  2.35*   < > = values in this interval not displayed.    Estimated Creatinine Clearance: 40.2 mL/min (A) (by C-G formula based on SCr of 2.35 mg/dL (H)).    Allergies  Allergen Reactions   Morphine And Codeine Anaphylaxis   Shellfish Allergy Anaphylaxis   Betadine  [Povidone Iodine ] Itching   Bupropion Other (See Comments)    Shaking of the body, hallucinations    Chlorhexidine  Hives    Patient reports never had issues CHG with mouth rinse.   Influenza Vaccines Hives   Metoprolol  Rash    Frank [name] Frank [name], PharmD, BCPS Clinical Staff Pharmacist.  Enis Harsh [facility] 07/14/2023 11:29 AM

## 2023-07-14 NOTE — Progress Notes (Addendum)
 NAME:  Frank Walloch., MRN:  [id number], DOB:  [birth year], LOS: 11 ADMISSION DATE:  07/03/2023, CONSULTATION DATE:  07/06/23 REFERRING MD:  Dr Maury Space, CHIEF COMPLAINT:  Encephalopathy   History of Present Illness:  PCCM asked to see patient for encephalopathy   Transferred from [facility] with 1 week of generalized weakness, altered mentation, decreased appetite Recently being worked up for multiple myeloma.  Transferred to [facility] for oncology evaluation Nephrology consulted for AKI   Background history of obesity, asthma, obstructive sleep apnea on CPAP GERD, hypertension, hyperlipidemia Has been feeling weak for the last few days according to spouse Admitted with concern for sepsis, community-acquired pneumonia, antibiotic encephalopathy Workup revealed lytic bony lesions with concern for multiple myeloma Transfused with 3 units packed red blood cells Evaluation so far-bone marrow 07/04/2023  Pertinent  Medical History   Past Medical History:  Diagnosis Date   Anxiety    Arthritis    Asthma    Bipolar disorder (HCC)    Current every day smoker    Depression    Diabetes mellitus, type II (HCC)    Dyspnea    History of kidney stones    History of pneumonia    Hyperlipidemia    Hypertension    Morbid obesity (HCC)    Sedentary lifestyle    Seizures (HCC)    last seizure 12 yrs ago, no current problem   Sleep apnea    uses CPAP nightly   Significant Hospital Events: Including procedures, antibiotic start and stop dates in addition to other pertinent events   4/19-blood cultures, MRSA PCR negative 4/19 chest x-ray with mild pulmonary congestion 4/20 atrial fibrillation with RVR 4/21 nosebleed after IR NG attempt, NG placed by PCCM in afternoon, stable transferred to [facility] 4/22 intubated; some abla requiring prbcs; ct abd/pelvis showing acute groin hematoma tracking from posterior presumed from site of bone marrow biopsy; heparin  held EEG 4/25 >> moderate to severe  encephalopathy without any seizures or epileptiform discharges 4/23 respiratory culture >> MRSE 4/28 remains intubated on CRRT, down to one pressor   Interim History / Subjective:  No acute overnight events, off vaso and down to Norepi Remains on CVVH, now keeping even.    Objective   Blood pressure 100/83, pulse (!) 118, temperature 98.5 F (36.9 C), resp. rate 20, height 6' 0.01" (1.829 m), weight 93.6 kg, SpO2 98%.    Vent Mode: PRVC FiO2 (%):  [30 %] 30 % Set Rate:  [20 bmp] 20 bmp Vt Set:  [650 mL] 650 mL PEEP:  [5 cmH20] 5 cmH20 Plateau Pressure:  [17 cmH20-20 cmH20] 18 cmH20   Intake/Output Summary (Last 24 hours) at 07/14/2023 0841 Last data filed at 07/14/2023 0800 Gross per 24 hour  Intake 3325.65 ml  Output 3975.5 ml  Net -649.85 ml   Filed Weights   07/12/23 0700 07/13/23 0500 07/14/23 0503  Weight: 98 kg 96.6 kg 93.6 kg    General:  critically ill M, intubated and sedated  HEENT: MM pink/moist, ETT in place Neuro: slightly nods to questions, examined on fentanyl  CV: s1s2 tachycardic, no m/r/g PULM:  mildly diminished air entry bilateral bases without wheezing or rhonchi GI: soft, bsx4 active  Extremities: warm/dry, trace edema  Skin: no rashes or lesions  Labs: Glu 260 Ammonia 87 Na 128 from 129 yesterday Creatinine 2.35 ( 2.4 yesterday) Pro >12 Ca 7.7 Platelets 125  Micro: 4/23 respiratory cx> staph epi, multi-resistant  4/19 Bcx2>NGTD  Abx: Zosyn  4/22-  I/O: 1.5L  off with CRRT overnight Net + 111cc 50cc UOP yesterday   Resolved Hospital Problem list   Hypercalcemia-resolved  Assessment & Plan:    Acute hypoxemic respiratory failure: Most likely in the setting of encephalopathy and aspiration of oropharyngeal secretions with appearance of bloody frothy secretions through trachea during intubation.  Also possible combination of volume overload in setting of renal failure and blood products, increased fluid load from chemotherapy  etc. Plan: -Continue to work on weaning sedation.  Hopefully will be candidate for SBT as he wakes up, stabilizes hemodynamically -Maintain full vent support with SAT/SBT as tolerated -titrate Vent setting to maintain SpO2 greater than or equal to 90%. -HOB elevated 30 degrees. -Plateau pressures less than 30 cm H20.  -Follow chest x-ray, ABG prn.   -Bronchial hygiene and RT/bronchodilator protocol. - Remains on Zosyn  for possible aspiration pneumonia.  Respiratory culture has few MRSE, unclear whether this is a pathogen versus contaminant, however his shock physiology is improving, so will hold adding Vanc, CXR looks stable    Multiple myeloma Suspected tumor lysis - Presented with encephalopathy, AKI, hypocalcemia, lytic bone lesions, Elevated immunoglobulins.  Protein remains >12 - Bone marrow results consistent with myeloma -Appreciate hematology/oncology assistance.  Received Velcade  4/25, next chemotherapy 4/29 with Velcade  and Cytoxan .  -Following tumor lysis labs   Paroxysmal atrial fibrillation with RVR converted to ST 4/22 AM -Anticoagulation on hold - Continue amiodarone  po - Appreciate cardiology assistance -check TSH  AKI: Creatinine, BUN slowly rising but urine output appears to be picking up which is encouraging -started CRRT 4/23 -Appreciate nephrology management on CVVH.  Agree with changing UF to keep I=O given his hemodynamics - Remains largely anuric, following - Follow BMP, electrolytes   Fever:  Related to pneumonia, possible myeloma. Plan: -Continue Zosyn .  Consider treatment respiratory MRSE as above depending on trend   ABLA from right groin hematoma:  Related to myeloma, lab checks, prior hydration with hypercalcemia, hematoma tracks from site of bone marrow biopsy down to thigh on CT. Hgb now stable.  -now stable, consider resuming anticoagulation - Following intermittent CBC - Transfusion goal hemoglobin 7.0   Acute metabolic encephalopathy -  Multifactorial, sepsis, hypercalcemia, myeloma Hyperammonemia History of seizure disorder -No evidence seizures on EEG 4/25.  Plan continue Depakote  as ordered - Minimize sedation - Following intermittent ammonia, continue to hold lactulose  for now   Shock:  sedation related, hypovolemia questioned as increase pressors with UF via CRRT; possible sepsis -off vaso, norepi requirement decreased, MAP goal 65 - Keeping even with CVVH - Empiric antibiotics as above   History of obstructive sleep apnea - CPAP at night if improves   Hypoalbuminemia -Tube feeding RD   T2DM -Continue current sliding scale insulin , TF coverage, basal insulin   Depression/anxiety - Continue to hold buspar , paxil , klonopin; hold wellbutrin (with hx of seizure disorder should likely not be restarted)   Peripheral neuropathy - Continue to hold gabapentin    Best Practice (right click and "Reselect all SmartList Selections" daily)   Diet/type: tubefeeds DVT prophylaxis SCD Pressure ulcer(s): N/A GI prophylaxis: H2B Lines: Central line Foley:  Yes, and it is still needed Code Status:  full code Last date of multidisciplinary goals of care discussion [family update pending 4/28]  Labs   CBC: Recent Labs  Lab 07/10/23 0411 07/11/23 0408 07/12/23 0443 07/12/23 1632 07/13/23 0505 07/14/23 0524  WBC 12.2* 8.6 9.2  --  5.6 4.6  NEUTROABS 10.4* 6.5 6.9  --  4.1 3.3  HGB 7.3* 7.3* 7.0* 8.4*  7.9* 8.3*  HCT 23.8* 24.3* 23.4* 27.1* 25.0* 27.4*  MCV 91.2 94.2 95.9  --  92.6 94.5  PLT 112* 115* 146*  --  111* 125*    Basic Metabolic Panel: Recent Labs  Lab 07/10/23 0411 07/10/23 1625 07/11/23 0408 07/12/23 0443 07/12/23 1632 07/13/23 0505 07/13/23 1728 07/14/23 0524  NA 133*  133* 133*   < > 130* 135 129*  129* 131* 128*  129*  K 4.0  4.0 4.3   < > 4.4 4.0 4.4  4.4 4.3 4.8  4.8  CL 105  104 103   < > 98 110 98  99 102 98  98  CO2 22  23 24    < > 23 19* 23  23 20* 22  23  GLUCOSE  284*  284* 267*   < > 216* 278* 307*  308* 305* 251*  249*  BUN 62*  66* 53*   < > 50* 47* 57*  57* 56* 57*  54*  CREATININE 3.74*  3.64* 3.24*   < > 2.32* 2.15* 2.47*  2.38* 2.40* 2.43*  2.35*  CALCIUM  6.9*  6.9* 7.1*   < > 7.3* 5.9* 7.3*  7.3* 7.4* 7.7*  7.7*  MG 2.3 2.5*  --  2.7*  --  3.0*  --  2.9*  PHOS 3.0  2.8 2.6   < > <1.0* 4.0 3.0 3.3 <1.0*   < > = values in this interval not displayed.   GFR: Estimated Creatinine Clearance: 40.2 mL/min (A) (by C-G formula based on SCr of 2.35 mg/dL (H)). Recent Labs  Lab 07/08/23 0828 07/09/23 0504 07/11/23 0408 07/12/23 0443 07/13/23 0505 07/14/23 0524  PROCALCITON 0.54  --   --   --   --   --   WBC 15.7*   < > 8.6 9.2 5.6 4.6   < > = values in this interval not displayed.    Liver Function Tests: Recent Labs  Lab 07/10/23 0411 07/10/23 1625 07/11/23 0934 07/11/23 1637 07/12/23 0443 07/12/23 1632 07/13/23 0505 07/13/23 1728 07/14/23 0524  AST 21  --  25  --  33  --  35  --  19  ALT 38  --  43  --  52*  --  63*  --  50*  ALKPHOS 42  --  42  --  45  --  48  --  48  BILITOT 0.5  --  0.4  --  0.5  --  0.5  --  0.6  PROT 11.3*  --  11.8*  --  >12.0*  --  >12.0*  --  >12.0*  ALBUMIN  1.6*  1.7*   < > 1.8*   < > 2.1* 1.7* 2.2*  2.2* 2.1* 2.1*  2.2*   < > = values in this interval not displayed.   No results for input(s): "LIPASE", "AMYLASE" in the last 168 hours. Recent Labs  Lab 07/08/23 0828 07/11/23 1022 07/12/23 0443 07/14/23 0524  AMMONIA 116* 109* 58* 87*    ABG    Component Value Date/Time   PHART 7.33 (L) 07/08/2023 1252   PCO2ART 47 07/08/2023 1252   PO2ART 369 (H) 07/08/2023 1252   HCO3 24.3 07/08/2023 1252   TCO2 28 01/29/2023 1044   ACIDBASEDEF 1.5 07/08/2023 1252   O2SAT 100 07/08/2023 1252     Coagulation Profile: Recent Labs  Lab 07/07/23 2030  INR 1.9*    Cardiac Enzymes: No results for input(s): "CKTOTAL", "CKMB", "CKMBINDEX", "TROPONINI" in the last 168  hours.  HbA1C: Hgb A1c MFr Bld  Date/Time Value Ref Range Status  01/29/2023 02:40 PM 6.7 (H) 4.8 - 5.6 % Final    Comment:    (NOTE) Pre diabetes:          5.7%-6.4%  Diabetes:              >6.4%  Glycemic control for   <7.0% adults with diabetes   11/12/2021 11:30 AM 6.5 (H) 4.8 - 5.6 % Final    Comment:    (NOTE) Pre diabetes:          5.7%-6.4%  Diabetes:              >6.4%  Glycemic control for   <7.0% adults with diabetes     CBG: Recent Labs  Lab 07/13/23 1610 07/13/23 1934 07/13/23 2305 07/14/23 0409 07/14/23 0734  GLUCAP 281* 240* 228* 242* 260*    CRITICAL CARE Performed by: [name] Celinda Dethlefs   Total critical care time: 40 minutes  Critical care time was exclusive of separately billable procedures and treating other patients.  Critical care was necessary to treat or prevent imminent or life-threatening deterioration.  Critical care was time spent personally by me on the following activities: development of treatment plan with patient and/or surrogate as well as nursing, discussions with consultants, evaluation of patient's response to treatment, examination of patient, obtaining history from patient or surrogate, ordering and performing treatments and interventions, ordering and review of laboratory studies, ordering and review of radiographic studies, pulse oximetry and re-evaluation of patient's condition.    [name] Delanna Blacketer, PA-C [facility] See Amion for pager If no response to pager , please call [phone] until 7pm After 7:00 pm call Elink  [phone]

## 2023-07-15 ENCOUNTER — Encounter (HOSPITAL_COMMUNITY): Payer: Self-pay | Admitting: *Deleted

## 2023-07-15 DIAGNOSIS — R579 Shock, unspecified: Secondary | ICD-10-CM | POA: Diagnosis not present

## 2023-07-15 DIAGNOSIS — C9000 Multiple myeloma not having achieved remission: Secondary | ICD-10-CM | POA: Diagnosis not present

## 2023-07-15 DIAGNOSIS — N179 Acute kidney failure, unspecified: Secondary | ICD-10-CM | POA: Diagnosis not present

## 2023-07-15 DIAGNOSIS — R531 Weakness: Secondary | ICD-10-CM | POA: Diagnosis not present

## 2023-07-15 DIAGNOSIS — J9601 Acute respiratory failure with hypoxia: Secondary | ICD-10-CM | POA: Diagnosis not present

## 2023-07-15 LAB — GLUCOSE, CAPILLARY
Glucose-Capillary: 255 mg/dL — ABNORMAL HIGH (ref 70–99)
Glucose-Capillary: 238 mg/dL — ABNORMAL HIGH (ref 70–99)
Glucose-Capillary: 281 mg/dL — ABNORMAL HIGH (ref 70–99)
Glucose-Capillary: 254 mg/dL — ABNORMAL HIGH (ref 70–99)
Glucose-Capillary: 178 mg/dL — ABNORMAL HIGH (ref 70–99)
Glucose-Capillary: 90 mg/dL (ref 70–99)

## 2023-07-15 LAB — CBC WITH DIFFERENTIAL/PLATELET
Abs Immature Granulocytes: 0.06 10*3/uL (ref 0.00–0.07)
Basophils Absolute: 0 10*3/uL (ref 0.0–0.1)
Basophils Relative: 0 %
Eosinophils Absolute: 0 10*3/uL (ref 0.0–0.5)
Eosinophils Relative: 0 %
HCT: 27.3 % — ABNORMAL LOW (ref 39.0–52.0)
Hemoglobin: 8.4 g/dL — ABNORMAL LOW (ref 13.0–17.0)
Immature Granulocytes: 2 %
Lymphocytes Relative: 45 %
Lymphs Abs: 1.5 10*3/uL (ref 0.7–4.0)
MCH: 29.1 pg (ref 26.0–34.0)
MCHC: 30.8 g/dL (ref 30.0–36.0)
MCV: 94.5 fL (ref 80.0–100.0)
Monocytes Absolute: 0.2 10*3/uL (ref 0.1–1.0)
Monocytes Relative: 5 %
Neutro Abs: 1.6 10*3/uL — ABNORMAL LOW (ref 1.7–7.7)
Neutrophils Relative %: 48 %
Platelets: 148 10*3/uL — ABNORMAL LOW (ref 150–400)
RBC: 2.89 MIL/uL — ABNORMAL LOW (ref 4.22–5.81)
RDW: 17.8 % — ABNORMAL HIGH (ref 11.5–15.5)
WBC: 3.4 10*3/uL — ABNORMAL LOW (ref 4.0–10.5)
nRBC: 74.3 % — ABNORMAL HIGH (ref 0.0–0.2)

## 2023-07-15 LAB — BLOOD GAS, ARTERIAL
Acid-Base Excess: 5.4 mmol/L — ABNORMAL HIGH (ref 0.0–2.0)
Bicarbonate: 29 mmol/L — ABNORMAL HIGH (ref 20.0–28.0)
Drawn by: 56037
FIO2: 30 %
MECHVT: 650 mL
O2 Saturation: 99.8 %
PEEP: 5 cmH2O
Patient temperature: 37.8
RATE: 20 {breaths}/min
pCO2 arterial: 39 mmHg (ref 32–48)
pH, Arterial: 7.48 — ABNORMAL HIGH (ref 7.35–7.45)
pO2, Arterial: 77 mmHg — ABNORMAL LOW (ref 83–108)

## 2023-07-15 LAB — RENAL FUNCTION PANEL
Albumin: 2.2 g/dL — ABNORMAL LOW (ref 3.5–5.0)
Albumin: 2 g/dL — ABNORMAL LOW (ref 3.5–5.0)
Anion gap: 9 (ref 5–15)
Anion gap: 7 (ref 5–15)
BUN: 64 mg/dL — ABNORMAL HIGH (ref 6–20)
BUN: 59 mg/dL — ABNORMAL HIGH (ref 6–20)
CO2: 21 mmol/L — ABNORMAL LOW (ref 22–32)
CO2: 24 mmol/L (ref 22–32)
Calcium: 7.4 mg/dL — ABNORMAL LOW (ref 8.9–10.3)
Calcium: 7.1 mg/dL — ABNORMAL LOW (ref 8.9–10.3)
Chloride: 99 mmol/L (ref 98–111)
Chloride: 101 mmol/L (ref 98–111)
Creatinine, Ser: 2.31 mg/dL — ABNORMAL HIGH (ref 0.61–1.24)
Creatinine, Ser: 2.38 mg/dL — ABNORMAL HIGH (ref 0.61–1.24)
GFR, Estimated: 32 mL/min — ABNORMAL LOW (ref >60)
GFR, Estimated: 31 mL/min — ABNORMAL LOW (ref >60)
Glucose, Bld: 266 mg/dL — ABNORMAL HIGH (ref 70–99)
Glucose, Bld: 251 mg/dL — ABNORMAL HIGH (ref 70–99)
Phosphorus: 4.7 mg/dL — ABNORMAL HIGH (ref 2.5–4.6)
Phosphorus: 2.3 mg/dL — ABNORMAL LOW (ref 2.5–4.6)
Potassium: 4.6 mmol/L (ref 3.5–5.1)
Potassium: 4.3 mmol/L (ref 3.5–5.1)
Sodium: 129 mmol/L — ABNORMAL LOW (ref 135–145)
Sodium: 132 mmol/L — ABNORMAL LOW (ref 135–145)

## 2023-07-15 LAB — MAGNESIUM: Magnesium: 2.8 mg/dL — ABNORMAL HIGH (ref 1.7–2.4)

## 2023-07-15 LAB — APTT: aPTT: 31 s (ref 24–36)

## 2023-07-15 LAB — TSH: TSH: 2.171 u[IU]/mL (ref 0.350–4.500)

## 2023-07-15 LAB — ERYTHROPOIETIN: Erythropoietin: 718.4 m[IU]/mL — ABNORMAL HIGH (ref 2.6–18.5)

## 2023-07-15 MED ORDER — SODIUM BICARBONATE 8.4 % IV SOLN
INTRAVENOUS | Status: AC
  Administered 2023-07-15: 100 meq via INTRAVENOUS
  Filled 2023-07-15: qty 100

## 2023-07-15 MED ORDER — PALONOSETRON HCL INJECTION 0.25 MG/5ML
0.2500 mg | Freq: Once | INTRAVENOUS | Status: AC
  Administered 2023-07-15: 0.25 mg via INTRAVENOUS
  Filled 2023-07-15: qty 5

## 2023-07-15 MED ORDER — SODIUM CHLORIDE 0.9 % IV BOLUS
500.0000 mL | Freq: Once | INTRAVENOUS | Status: AC
  Administered 2023-07-15: 500 mL via INTRAVENOUS

## 2023-07-15 MED ORDER — PHENYLEPHRINE 80 MCG/ML (10ML) SYRINGE FOR IV PUSH (FOR BLOOD PRESSURE SUPPORT)
PREFILLED_SYRINGE | INTRAVENOUS | Status: AC
Start: 2023-07-15 — End: 2023-07-15
  Filled 2023-07-15: qty 10

## 2023-07-15 MED ORDER — INSULIN GLARGINE-YFGN 100 UNIT/ML ~~LOC~~ SOLN
20.0000 [IU] | Freq: Every day | SUBCUTANEOUS | Status: DC
  Administered 2023-07-15 – 2023-07-18 (×4): 20 [IU] via SUBCUTANEOUS
  Filled 2023-07-15 (×4): qty 0.2

## 2023-07-15 MED ORDER — POLYETHYLENE GLYCOL 3350 17 G PO PACK
17.0000 g | PACK | Freq: Every day | ORAL | Status: DC
  Administered 2023-07-22: 17 g
  Filled 2023-07-15 (×5): qty 1

## 2023-07-15 MED ORDER — PHENYLEPHRINE HCL-NACL 20-0.9 MG/250ML-% IV SOLN
0.0000 ug/min | INTRAVENOUS | Status: DC
Start: 2023-07-15 — End: 2023-07-16
  Filled 2023-07-15: qty 250

## 2023-07-15 MED ORDER — SODIUM CHLORIDE 0.9 % IV SOLN
400.0000 mg/m2 | Freq: Once | INTRAVENOUS | Status: AC
  Administered 2023-07-15: 1000 mg via INTRAVENOUS
  Filled 2023-07-15: qty 50

## 2023-07-15 MED ORDER — FENTANYL 2500MCG IN NS 250ML (10MCG/ML) PREMIX INFUSION
25.0000 ug/h | INTRAVENOUS | Status: DC
  Administered 2023-07-15: 150 ug/h via INTRAVENOUS
  Administered 2023-07-16: 200 ug/h via INTRAVENOUS
  Administered 2023-07-16: 100 ug/h via INTRAVENOUS
  Administered 2023-07-17: 150 ug/h via INTRAVENOUS
  Administered 2023-07-18: 200 ug/h via INTRAVENOUS
  Administered 2023-07-18: 150 ug/h via INTRAVENOUS
  Administered 2023-07-19 (×2): 175 ug/h via INTRAVENOUS
  Administered 2023-07-21: 75 ug/h via INTRAVENOUS
  Administered 2023-07-21: 175 ug/h via INTRAVENOUS
  Administered 2023-07-22: 200 ug/h via INTRAVENOUS
  Administered 2023-07-22: 150 ug/h via INTRAVENOUS
  Administered 2023-07-23: 200 ug/h via INTRAVENOUS
  Administered 2023-07-23: 100 ug/h via INTRAVENOUS
  Administered 2023-07-24 – 2023-07-28 (×8): 200 ug/h via INTRAVENOUS
  Administered 2023-07-29 – 2023-07-30 (×2): 75 ug/h via INTRAVENOUS
  Filled 2023-07-15 (×25): qty 250

## 2023-07-15 MED ORDER — INSULIN ASPART 100 UNIT/ML IJ SOLN
6.0000 [IU] | INTRAMUSCULAR | Status: DC
  Administered 2023-07-15 – 2023-07-19 (×22): 6 [IU] via SUBCUTANEOUS

## 2023-07-15 MED ORDER — FENTANYL BOLUS VIA INFUSION
50.0000 ug | INTRAVENOUS | Status: DC | PRN
  Administered 2023-07-15 (×3): 100 ug via INTRAVENOUS
  Administered 2023-07-15: 75 ug via INTRAVENOUS
  Administered 2023-07-15 (×2): 100 ug via INTRAVENOUS
  Administered 2023-07-15: 50 ug via INTRAVENOUS
  Administered 2023-07-15: 75 ug via INTRAVENOUS
  Administered 2023-07-15 (×3): 100 ug via INTRAVENOUS
  Administered 2023-07-15: 50 ug via INTRAVENOUS
  Administered 2023-07-15 (×2): 100 ug via INTRAVENOUS
  Administered 2023-07-16: 50 ug via INTRAVENOUS
  Administered 2023-07-16 (×2): 100 ug via INTRAVENOUS
  Administered 2023-07-16: 50 ug via INTRAVENOUS
  Administered 2023-07-16 (×3): 100 ug via INTRAVENOUS
  Administered 2023-07-16: 50 ug via INTRAVENOUS
  Administered 2023-07-16 – 2023-07-17 (×15): 100 ug via INTRAVENOUS
  Administered 2023-07-19 – 2023-07-20 (×3): 50 ug via INTRAVENOUS
  Administered 2023-07-21 – 2023-07-22 (×11): 100 ug via INTRAVENOUS
  Administered 2023-07-22: 75 ug via INTRAVENOUS
  Administered 2023-07-22 – 2023-07-25 (×16): 100 ug via INTRAVENOUS
  Administered 2023-07-26: 50 ug via INTRAVENOUS
  Administered 2023-07-26 (×4): 100 ug via INTRAVENOUS
  Administered 2023-07-26: 50 ug via INTRAVENOUS
  Administered 2023-07-26 (×2): 100 ug via INTRAVENOUS
  Administered 2023-07-27 (×4): 50 ug via INTRAVENOUS
  Administered 2023-07-27 – 2023-07-28 (×3): 100 ug via INTRAVENOUS
  Administered 2023-07-28: 75 ug via INTRAVENOUS
  Administered 2023-07-28: 100 ug via INTRAVENOUS
  Administered 2023-07-28: 50 ug via INTRAVENOUS
  Administered 2023-07-29 (×2): 100 ug via INTRAVENOUS
  Administered 2023-07-29: 50 ug via INTRAVENOUS
  Administered 2023-07-30: 100 ug via INTRAVENOUS
  Administered 2023-07-30: 50 ug via INTRAVENOUS
  Administered 2023-07-30: 100 ug via INTRAVENOUS

## 2023-07-15 MED ORDER — DOCUSATE SODIUM 50 MG/5ML PO LIQD
100.0000 mg | Freq: Two times a day (BID) | ORAL | Status: DC
  Administered 2023-07-22 – 2023-07-27 (×5): 100 mg
  Filled 2023-07-15 (×10): qty 10

## 2023-07-15 MED ORDER — SODIUM BICARBONATE 8.4 % IV SOLN: 100.0000 meq | Freq: Once | INTRAVENOUS | Status: AC

## 2023-07-15 MED ORDER — PHENYLEPHRINE 80 MCG/ML (10ML) SYRINGE FOR IV PUSH (FOR BLOOD PRESSURE SUPPORT): 100.0000 ug | PREFILLED_SYRINGE | Freq: Once | INTRAVENOUS | Status: DC

## 2023-07-15 MED ORDER — ALBUMIN HUMAN 25 % IV SOLN
12.5000 g | Freq: Once | INTRAVENOUS | Status: AC
  Administered 2023-07-15: 12.5 g via INTRAVENOUS
  Filled 2023-07-15: qty 50

## 2023-07-15 MED ORDER — LACTATED RINGERS IV BOLUS
500.0000 mL | Freq: Once | INTRAVENOUS | Status: AC
  Administered 2023-07-15: 500 mL via INTRAVENOUS

## 2023-07-15 MED ORDER — BORTEZOMIB CHEMO SQ INJECTION 3.5 MG (2.5MG/ML)
1.3000 mg/m2 | Freq: Once | INTRAMUSCULAR | Status: AC
  Administered 2023-07-15: 3 mg via SUBCUTANEOUS
  Filled 2023-07-15: qty 1.2

## 2023-07-15 MED ORDER — FENTANYL CITRATE PF 50 MCG/ML IJ SOSY: 50.0000 ug | PREFILLED_SYRINGE | Freq: Once | INTRAMUSCULAR | Status: DC

## 2023-07-15 NOTE — Progress Notes (Signed)
 [facility] NEPHROLOGY PROGRESS NOTE  Assessment/ Plan: Pt is a 58 y.o. yo male with past medical history significant for HTN, HLD, type II DM, BPH, seizure who was initially presented at [facility] due to AMS, admitted for sepsis due to pneumonia, encephalopathy, sepsis, AKI, hypercalcemia symptomatic anemia and Lytic pulm lesion concern for multiple myeloma.  # AKI on CKD 3a - b/l creat 1.1- 1.5 from 2024, eGFR 53- >60 ml/min. Creat here 5.8 on presentation at OSH and last several days in the 4.0- 4.8 range. AKI due to possible myeloma kidney +/- hyperCa +/- ACEi (home med).  Initially treated with IV fluid and then required Lasix  because of fluid overload.   Started CRRT on 4/23, all 4K bath. UF 50 to 100 cc an hour, no systemic heparin  because of bleeding and hematoma however he will need limited heparin  in the circuit. Pt remains anuric and tolerating CRRT well.  Looks possibly dry, will change UF to keep net positive +65 cc/hr to cover insensible losses and try to avoid hypovolemia. Okay to bolus saline too as needed. Cont CRRT.   # Multiple myeloma: Oncologist started Velcade  and Cytoxan .  Management per oncology team.    # Shock- remains on Levo and vasopressin .    #Volume - required IV diuretics, then UF with CRRT. Today looks dry, will change UF to keep net positive +65 cc/hr as above.   # Atrial fib-  on IV amio and IV diltiazem .  #Hypercalcemia -  resolved  now. Per oncology.   #Multifocal PNA - on IV abx  #Anemia - prn transfusions.  # AMS/acute metabolic encephalopathy.  Concerned about hyperviscosity.  Oncology team and PCCM following.  Started CRRT for azotemia.  # Hyperuricemia: Rasburicase  and dialysis.  # Hypophosphatemia due to CRRT, repleted IV phosphorus.  Monitor lab.  Subjective: Seen and examined in ICU. BP's have been labile, down to 50s/ 30s earlier today. Rec'd some NS fluid boluses.    Objective Vital signs in last 24  hours: Vitals:   07/15/23 1105 07/15/23 1131 07/15/23 1200 07/15/23 1245  BP:    (!) 142/62  Pulse: 82 84  96  Resp: (!) 22 (!) 27  18  Temp:   (!) 100.7 F (38.2 C)   TempSrc:   Axillary   SpO2: 100% 98%  96%  Weight:      Height:        Physical Exam: General: Critically ill looking male, intubated, sedated Heart:RRR, s1s2 nl Lungs: Coarse breath sound bilateral Abdomen:soft, Non-tender, non-distended Extremities:No edema Neurology: [name]  MD  [facility] 07/15/2023, 12:48 PM  Recent Labs  Lab 07/14/23 1555 07/14/23 2006 07/15/23 0435  HGB  --  8.2* 8.4*  ALBUMIN  2.2*  --  2.2*  CALCIUM  7.4*  --  7.4*  PHOS <1.0*  --  4.7*  CREATININE 2.24*  --  2.31*  K 4.8  --  4.6    Inpatient medications:  amiodarone   200 mg Per NG tube BID   Followed by   [name] ON 07/24/2023] amiodarone   200 mg Per NG tube Daily   docusate  100 mg Per Tube BID   epoetin alfa  40,000 Units Subcutaneous QODAY   famotidine   10.4 mg Per Tube Daily   feeding supplement (PROSource TF20)  60 mL Per Tube TID   fentaNYL  (SUBLIMAZE ) injection  50 mcg Intravenous Once   insulin  aspart  0-20 Units Subcutaneous Q4H   insulin  aspart  6 Units Subcutaneous Q4H  insulin  glargine-yfgn  20 Units Subcutaneous Daily   lactulose   30 g Per Tube TID   nicotine   14 mg Transdermal Daily   mouth rinse  15 mL Mouth Rinse Q2H   phenylephrine   100 mcg Intravenous Once   pneumococcal 20-valent conjugate vaccine  0.5 mL Intramuscular Tomorrow-1000   polyethylene glycol  17 g Per Tube Daily   sodium chloride  flush  10-40 mL Intracatheter Q12H    dexmedetomidine  (PRECEDEX ) IV infusion 1.2 mcg/kg/hr (07/15/23 1243)   feeding supplement (VITAL 1.5 CAL) 60 mL/hr at 07/15/23 1100   fentaNYL  infusion INTRAVENOUS 50 mcg/hr (07/15/23 1100)   heparin  10,000 units/ 20 mL infusion syringe 1,000 Units/hr (07/15/23 1124)   norepinephrine  (LEVOPHED ) Adult infusion 10 mcg/min (07/15/23 1100)   phenylephrine   (NEO-SYNEPHRINE) Adult infusion Stopped (07/15/23 0752)   piperacillin -tazobactam (ZOSYN )  IV Stopped (07/15/23 0909)   prismasol  BGK 4/2.5 400 mL/hr at 07/15/23 0708   prismasol  BGK 4/2.5 400 mL/hr at 07/15/23 0733   prismasol  BGK 4/2.5 1,500 mL/hr at 07/15/23 0947   vancomycin  1,000 mg (07/15/23 1247)   vasopressin  0.03 Units/min (07/15/23 1100)   acetaminophen  (TYLENOL ) oral liquid 160 mg/5 mL **OR** acetaminophen , fentaNYL , heparin , LORazepam , midazolam , mouth rinse, oxyCODONE , polyethylene glycol, polyvinyl alcohol, sodium chloride  flush

## 2023-07-15 NOTE — Progress Notes (Signed)
 eLink Physician-Brief Progress Note Patient Name: Frank [name]. DOB: [birth year] MRN: [id number]   Date of Service  07/15/2023  HPI/Events of Note  Hypotension. Seen on camera. RN notes he was very agitated and not able to tolerate CRRT due to alarms from the agitation. Sedation was increased. He got fentanyl  and versed  boluses which calmed him down but then dropped his Bps to maps in 50s which is when we were called. RN had already stopped sedation and stopped pulling on CRRT. NO distress currently on camera. Has been on levophed  and vaso (never needed the phenylephrine ) and RN notes he is in fact hypertensive when agitated and restless.   eICU Interventions  Albumin  x 1 Continue levo and vaso , RN has increased the vaso and stopped pulling on CRRT to allow him to settle Current map is 60      Intervention Category Major Interventions: Shock - evaluation and management  Frank [name] 07/15/2023, 11:06 PM

## 2023-07-15 NOTE — Progress Notes (Signed)
 Leave cyclophosphamide  dose at 1,000 mg today per Dr. Maria Shiner.

## 2023-07-15 NOTE — Progress Notes (Signed)
 NAME:  Frank Burghart., MRN:  [id number], DOB:  1965-03-30, LOS: 12 ADMISSION DATE:  07/03/2023, CONSULTATION DATE:  07/06/23 REFERRING MD:  Dr Maury Space, CHIEF COMPLAINT:  Encephalopathy   History of Present Illness:  PCCM asked to see patient for encephalopathy   Transferred from [facility] with 1 week of generalized weakness, altered mentation, decreased appetite Recently being worked up for multiple myeloma.  Transferred to [facility] for oncology evaluation Nephrology consulted for AKI   Background history of obesity, asthma, obstructive sleep apnea on CPAP GERD, hypertension, hyperlipidemia Has been feeling weak for the last few days according to spouse Admitted with concern for sepsis, community-acquired pneumonia, antibiotic encephalopathy Workup revealed lytic bony lesions with concern for multiple myeloma Transfused with 3 units packed red blood cells Evaluation so far-bone marrow 07/04/2023  Pertinent  Medical History   Past Medical History:  Diagnosis Date   Anxiety    Arthritis    Asthma    Bipolar disorder (HCC)    Current every day smoker    Depression    Diabetes mellitus, type II (HCC)    Dyspnea    History of kidney stones    History of pneumonia    Hyperlipidemia    Hypertension    Morbid obesity (HCC)    Sedentary lifestyle    Seizures (HCC)    last seizure 12 yrs ago, no current problem   Sleep apnea    uses CPAP nightly   Significant Hospital Events: Including procedures, antibiotic start and stop dates in addition to other pertinent events   4/19-blood cultures, MRSA PCR negative 4/19 chest x-ray with mild pulmonary congestion 4/20 atrial fibrillation with RVR 4/21 nosebleed after IR NG attempt, NG placed by PCCM in afternoon, stable transferred to [facility] 4/22 intubated; some abla requiring prbcs; ct abd/pelvis showing acute groin hematoma tracking from posterior presumed from site of bone marrow biopsy; heparin  held EEG 4/25 >> moderate to severe  encephalopathy without any seizures or epileptiform discharges 4/23 respiratory culture >> MRSE 4/28 remains intubated on CRRT, down to one pressor  4/29 labile BP, NE back up. Responded a bit to fluids  Interim History / Subjective:  Labile BP early this AM. Did respond to small fluid bolus. Dry on exam. Tmax 101.4 overnight.  Objective   Blood pressure (!) 148/84, pulse 81, temperature 100.1 F (37.8 C), temperature source Axillary, resp. rate 20, height 6' 0.01" (1.829 m), weight 94.7 kg, SpO2 98%.    Vent Mode: PRVC FiO2 (%):  [30 %] 30 % Set Rate:  [20 bmp] 20 bmp Vt Set:  [650 mL] 650 mL PEEP:  [5 cmH20] 5 cmH20 Plateau Pressure:  [18 cmH20] 18 cmH20   Intake/Output Summary (Last 24 hours) at 07/15/2023 2130 Last data filed at 07/15/2023 0700 Gross per 24 hour  Intake 4039.27 ml  Output 3625 ml  Net 414.27 ml   Filed Weights   07/13/23 0500 07/14/23 0503 07/15/23 0340  Weight: 96.6 kg 93.6 kg 94.7 kg   Physical Exam: General:  critically ill M, intubated and sedated  HEENT: MM dry, ETT in place Neuro: Sedated, not responsive CV: RRR, no M/R/G PULM:  mildly diminished air entry bilateral bases without wheezing or rhonchi GI: soft, bsx4 active  Extremities: warm/dry Skin: + skin tenting, no rashes or lesions  Micro: 4/28 BCx >  4/23 respiratory cx> staph epi, multi-resistant  4/19 Bcx2>NGTD   Assessment & Plan:    Acute hypoxemic respiratory failure: Most likely in the setting of  encephalopathy and aspiration of oropharyngeal secretions with appearance of bloody frothy secretions through trachea during intubation.  MRSE noted in sputum. Also possible combination of volume overload in setting of renal failure and blood products, increased fluid load from chemotherapy etc. 4/29 appears volume down. Plan: -Continue full vent support. -Continue SAT/SBT as tolerated and once hemodynamics remain stable. -titrate Vent setting to maintain SpO2 greater than or equal to  90%. -HOB elevated 30 degrees. -Plateau pressures less than 30 cm H20.  -Follow chest x-ray, ABG prn.   -Bronchial hygiene and RT/bronchodilator protocol. -Continue vanc/zosyn . -CXR intermittently.  Shock - multifactorial, seems hypovolemia at play along with possible MRSE PNA. Echo 4/21 reassuring. -Continue Vanc/Zosyn . -Add additional 500cc LR bolus. -Follow repeat BCx's 4/28.  Multiple myeloma Suspected tumor lysis - labs improved with CRRT -Oncology on board, appreciate the assistance. -Appreciate hematology/oncology assistance.  Received Velcade  4/25, next chemotherapy 4/29 with Velcade  and Cytoxan .  -Following tumor lysis labs intermittently   Paroxysmal atrial fibrillation with RVR - converted to NSR/ST 4/22 -Anticoagulation on hold, if has recurrence then will need to resume  -Continue amiodarone  po -Appreciate cardiology assistance  AKI: Creatinine, BUN slowly rising but urine output appears to be picking up which is encouraging -Nephrology following, appreciate the assistance. -Continue CRRT, run at even. -Follow BMP, electrolytes.   ABLA from right groin hematoma:  Related to myeloma, lab checks, prior hydration with hypercalcemia, hematoma tracks from site of bone marrow biopsy down to thigh on CT. Hgb now stable.  -Following intermittent CBC -Transfusion goal hemoglobin 7.0   Acute metabolic encephalopathy - Multifactorial, sepsis, hypercalcemia, myeloma  Hyperammonemia History of seizure disorder - No evidence seizures on EEG 4/25.   - Holding Depakote  given hyperammonemia. -Continue lactulose . - Minimize sedation. - Following intermittent ammonia.    History of obstructive sleep apnea - CPAP at night if improves/once extubated   Hypoalbuminemia -Tube feeding RD   T2DM -Continue current sliding scale insulin , TF coverage, basal insulin   Depression/anxiety - Continue to hold buspar , paxil , klonopin; hold wellbutrin (with hx of seizure disorder should  likely not be restarted)   Peripheral neuropathy - Continue to hold gabapentin    Best Practice (right click and "Reselect all SmartList Selections" daily)   Diet/type: tubefeeds DVT prophylaxis SCD Pressure ulcer(s): N/A GI prophylaxis: H2B Lines: Central line Foley:  Yes, and it is still needed Code Status:  full code Last date of multidisciplinary goals of care discussion [family update pending 4/28]   CC time: 40 min.   Rafael Bun, PA - [facility] For pager details, please see AMION or use Epic chat  After 1900, please call ELINK for cross coverage needs 07/15/2023, 8:05 AM

## 2023-07-15 NOTE — Plan of Care (Signed)
   Problem: Elimination: Goal: Will not experience complications related to bowel motility Outcome: Progressing   Problem: Pain Managment: Goal: General experience of comfort will improve and/or be controlled Outcome: Progressing

## 2023-07-15 NOTE — Progress Notes (Signed)
 This morning, there is a lot of problems with his blood pressure.  He is now on both Levophed  and vasopressin .  When I saw him, his blood pressure was 73/45.  He still is on the vent.  He still is getting CRRT.  I had believe that there is more going on with him then just myeloma.  I do know that the myeloma genetics are not that good for him.  By the cytogenetics, his myeloma is certainly more aggressive.  I think we do have to give him treatment today with both Velcade  and Cytoxan .  He really had no problems with the first week.  His labs show a sodium 129.  Potassium 4.6.  BUN 64 creatinine 2.3.  Glucose is 266.  Calcium  7.4 with an albumin  of 2.2.  His white cell count 3.4.  Hemoglobin 8.4.  Platelet count 148,000.  He is getting some tube feeds.  His vital signs are temperature of 100.1.  Pulse 83.  Blood pressure 138/76.  His lungs sound relatively clear bilaterally.  He has decent air movement bilaterally.  Cardiac exam regular rate and rhythm.  He has an occasional extra beat.  Abdomen is soft.  Bowel sounds are somewhat decreased.  He has no fluid wave.  Extremity shows some chronic mild edema in the legs.  Frank [name] has IgG kappa myeloma.  Again there has been some else going on that is really contributing to his difficulty.  I realize that he has a very low IgA and IgM level.  This could certainly lead to immune compromise.  We will go ahead with his second week of treatment.  He will get both Velcade  and Cytoxan  today.  I really think we have to push through despite his lab abnormalities and his low blood pressure-which is much better now with addition of vasopressin .  This is truly incredibly challenging.  I would really like to hope that we will see the protein level start improving this week.  Again, I realize that he does have adverse chromosomes with his myeloma which could make responses a little bit longer to see.  I do appreciate the incredible care he is getting from the  staff in the ICU.   [name], MD  [place] 3:19

## 2023-07-15 NOTE — Progress Notes (Signed)
 Nutrition Follow-up  DOCUMENTATION CODES:   Not applicable  INTERVENTION:  - Continue goal TF: Vital 1.5 at 60 ml/h (1440 ml per day) Prosource TF20 60 ml TID Provides 2400 kcal, 157 gm protein, 1100 ml free water  daily   - FWF per CCM/MD.    - Monitor weight trends.     NUTRITION DIAGNOSIS:   Inadequate oral intake related to inability to eat as evidenced by NPO status. *ongoing  GOAL:   Patient will meet greater than or equal to 90% of their needs *met with TF  MONITOR:   Vent status, Labs, Weight trends, TF tolerance  REASON FOR ASSESSMENT:   Consult Assessment of nutrition requirement/status  ASSESSMENT:   58 y.o. male with PMH of obesity, asthma, OSA on CPAP, GERD, HTN, HLD who presented due to feeling weak with AMS for a few days. Admitted with concern for sepsis, community-acquired pneumonia, encephalopathy as well as concern for multiple myeloma.  4/17 Admit 4/18 Regular textured diet 4/20 Cortrak attempted but unable to be placed 4/21 SLP eval - NPO; IR attempted Cortrak but unable to be placed; CCM MD successfully placed NGT; TF started 4/22 Concern for aspiration overnight and TF stopped; Intubated; TF restarted in the evening 4/23 CRRT initiated   Patient remains intubated on ventilator support MV: 17.3 L/min Temp (24hrs), Avg:100 F (37.8 C), Min:97.3 F (36.3 C), Max:101.7 F (38.7 C)  Patient continues on goal tube feeds, no noted intolerances.  Remains on CRRT.  Getting chemo for multiple myeloma.     Admit weight: 243# Current weight: 208# I&O's: -3.1L since admit   Medications reviewed and include: Colace BID, Miralax , Lactulose  TID Precedex  Fentanyl  Levo @ 8 mcg/min Vasopressin  @ 0.03 units/min   Labs reviewed:  Na 129 Creatinine 2.31 Phosphorus 4.7 Magnesium  2.8 HA1C 6.7 (as of 01/29/23) Blood Glucose 202-260 x24 hours   Diet Order:   Diet Order             Diet NPO time specified  Diet effective now                    EDUCATION NEEDS:  Not appropriate for education at this time  Skin:  Skin Assessment: Skin Integrity Issues: Skin Integrity Issues:: Unstageable Unstageable: Left and Right Heel  Last BM:  4/28 - rectal tube  Height:  Ht Readings from Last 1 Encounters:  07/13/23 6' 0.01" (1.829 m)   Weight:  Wt Readings from Last 1 Encounters:  07/15/23 94.7 kg   Ideal Body Weight:  86.36 kg  BMI:  Body mass index is 28.31 kg/m.  Estimated Nutritional Needs:  Kcal:  2350-2550 kcals Protein:  145-175 grams Fluid:  >/= 2.3L    Scheryl Cushing RD, LDN Contact via Secure Chat.

## 2023-07-16 DIAGNOSIS — N179 Acute kidney failure, unspecified: Secondary | ICD-10-CM | POA: Diagnosis not present

## 2023-07-16 DIAGNOSIS — J9601 Acute respiratory failure with hypoxia: Secondary | ICD-10-CM | POA: Diagnosis not present

## 2023-07-16 DIAGNOSIS — R579 Shock, unspecified: Secondary | ICD-10-CM | POA: Diagnosis not present

## 2023-07-16 DIAGNOSIS — C9000 Multiple myeloma not having achieved remission: Secondary | ICD-10-CM | POA: Diagnosis not present

## 2023-07-16 DIAGNOSIS — R531 Weakness: Secondary | ICD-10-CM | POA: Diagnosis not present

## 2023-07-16 LAB — CBC WITH DIFFERENTIAL/PLATELET
Abs Immature Granulocytes: 0.01 10*3/uL (ref 0.00–0.07)
Basophils Absolute: 0 10*3/uL (ref 0.0–0.1)
Basophils Relative: 1 %
Eosinophils Absolute: 0 10*3/uL (ref 0.0–0.5)
Eosinophils Relative: 2 %
HCT: 24.9 % — ABNORMAL LOW (ref 39.0–52.0)
Hemoglobin: 7.5 g/dL — ABNORMAL LOW (ref 13.0–17.0)
Immature Granulocytes: 1 %
Lymphocytes Relative: 52 %
Lymphs Abs: 1 10*3/uL (ref 0.7–4.0)
MCH: 29.5 pg (ref 26.0–34.0)
MCHC: 30.1 g/dL (ref 30.0–36.0)
MCV: 98 fL (ref 80.0–100.0)
Monocytes Absolute: 0.2 10*3/uL (ref 0.1–1.0)
Monocytes Relative: 9 %
Neutro Abs: 0.6 10*3/uL — ABNORMAL LOW (ref 1.7–7.7)
Neutrophils Relative %: 35 %
Platelets: 109 10*3/uL — ABNORMAL LOW (ref 150–400)
RBC: 2.54 MIL/uL — ABNORMAL LOW (ref 4.22–5.81)
RDW: 18.3 % — ABNORMAL HIGH (ref 11.5–15.5)
WBC: 1.8 10*3/uL — ABNORMAL LOW (ref 4.0–10.5)
nRBC: 93.3 % — ABNORMAL HIGH (ref 0.0–0.2)

## 2023-07-16 LAB — RENAL FUNCTION PANEL
Albumin: 2.2 g/dL — ABNORMAL LOW (ref 3.5–5.0)
Albumin: 1.9 g/dL — ABNORMAL LOW (ref 3.5–5.0)
Anion gap: 9 (ref 5–15)
Anion gap: 4 — ABNORMAL LOW (ref 5–15)
BUN: 60 mg/dL — ABNORMAL HIGH (ref 6–20)
BUN: 52 mg/dL — ABNORMAL HIGH (ref 6–20)
CO2: 23 mmol/L (ref 22–32)
CO2: 25 mmol/L (ref 22–32)
Calcium: 7.2 mg/dL — ABNORMAL LOW (ref 8.9–10.3)
Calcium: 7.1 mg/dL — ABNORMAL LOW (ref 8.9–10.3)
Chloride: 100 mmol/L (ref 98–111)
Chloride: 102 mmol/L (ref 98–111)
Creatinine, Ser: 2.14 mg/dL — ABNORMAL HIGH (ref 0.61–1.24)
Creatinine, Ser: 2.05 mg/dL — ABNORMAL HIGH (ref 0.61–1.24)
GFR, Estimated: 35 mL/min — ABNORMAL LOW (ref >60)
GFR, Estimated: 37 mL/min — ABNORMAL LOW (ref >60)
Glucose, Bld: 155 mg/dL — ABNORMAL HIGH (ref 70–99)
Glucose, Bld: 198 mg/dL — ABNORMAL HIGH (ref 70–99)
Phosphorus: 2.1 mg/dL — ABNORMAL LOW (ref 2.5–4.6)
Potassium: 4.5 mmol/L (ref 3.5–5.1)
Potassium: 3.9 mmol/L (ref 3.5–5.1)
Sodium: 132 mmol/L — ABNORMAL LOW (ref 135–145)
Sodium: 131 mmol/L — ABNORMAL LOW (ref 135–145)

## 2023-07-16 LAB — COMPREHENSIVE METABOLIC PANEL WITH GFR
ALT: 56 U/L — ABNORMAL HIGH (ref 0–44)
AST: 32 U/L (ref 15–41)
Albumin: 2.1 g/dL — ABNORMAL LOW (ref 3.5–5.0)
Alkaline Phosphatase: 41 U/L (ref 38–126)
Anion gap: 8 (ref 5–15)
BUN: 58 mg/dL — ABNORMAL HIGH (ref 6–20)
CO2: 23 mmol/L (ref 22–32)
Calcium: 7.1 mg/dL — ABNORMAL LOW (ref 8.9–10.3)
Chloride: 101 mmol/L (ref 98–111)
Creatinine, Ser: 2.16 mg/dL — ABNORMAL HIGH (ref 0.61–1.24)
GFR, Estimated: 34 mL/min — ABNORMAL LOW (ref >60)
Glucose, Bld: 155 mg/dL — ABNORMAL HIGH (ref 70–99)
Potassium: 4.5 mmol/L (ref 3.5–5.1)
Sodium: 132 mmol/L — ABNORMAL LOW (ref 135–145)
Total Bilirubin: 0.6 mg/dL (ref 0.0–1.2)
Total Protein: 10.7 g/dL — ABNORMAL HIGH (ref 6.5–8.1)

## 2023-07-16 LAB — APTT: aPTT: 34 s (ref 24–36)

## 2023-07-16 LAB — GLUCOSE, CAPILLARY
Glucose-Capillary: 137 mg/dL — ABNORMAL HIGH (ref 70–99)
Glucose-Capillary: 198 mg/dL — ABNORMAL HIGH (ref 70–99)
Glucose-Capillary: 228 mg/dL — ABNORMAL HIGH (ref 70–99)
Glucose-Capillary: 233 mg/dL — ABNORMAL HIGH (ref 70–99)

## 2023-07-16 LAB — MAGNESIUM: Magnesium: 2.7 mg/dL — ABNORMAL HIGH (ref 1.7–2.4)

## 2023-07-16 LAB — LACTATE DEHYDROGENASE: LDH: 293 U/L — ABNORMAL HIGH (ref 98–192)

## 2023-07-16 LAB — PHOSPHORUS

## 2023-07-16 MED ORDER — QUETIAPINE FUMARATE 50 MG PO TABS
50.0000 mg | ORAL_TABLET | Freq: Two times a day (BID) | ORAL | Status: DC
  Administered 2023-07-16 – 2023-07-24 (×18): 50 mg
  Filled 2023-07-16 (×18): qty 1

## 2023-07-16 MED ORDER — ALBUMIN HUMAN 25 % IV SOLN
12.5000 g | Freq: Once | INTRAVENOUS | Status: AC
Start: 2023-07-16 — End: 2023-07-16
  Administered 2023-07-16: 12.5 g via INTRAVENOUS
  Filled 2023-07-16: qty 50

## 2023-07-16 MED ORDER — THIAMINE MONONITRATE 100 MG PO TABS
100.0000 mg | ORAL_TABLET | Freq: Every day | ORAL | Status: DC
  Administered 2023-07-16 – 2023-08-26 (×42): 100 mg
  Filled 2023-07-16 (×42): qty 1

## 2023-07-16 MED ORDER — QUETIAPINE FUMARATE 50 MG PO TABS: 50.0000 mg | ORAL_TABLET | Freq: Every day | ORAL | Status: DC

## 2023-07-16 MED ORDER — BUSPIRONE HCL 10 MG PO TABS
15.0000 mg | ORAL_TABLET | Freq: Every day | ORAL | Status: DC
  Administered 2023-07-16 – 2023-08-09 (×25): 15 mg
  Filled 2023-07-16 (×2): qty 1
  Filled 2023-07-16: qty 2
  Filled 2023-07-16 (×7): qty 1
  Filled 2023-07-16: qty 2
  Filled 2023-07-16 (×14): qty 1

## 2023-07-16 MED ORDER — FILGRASTIM 480 MCG/1.6ML IJ SOLN
480.0000 ug | Freq: Every day | INTRAMUSCULAR | Status: DC
Start: 2023-07-16 — End: 2023-07-16

## 2023-07-16 MED ORDER — PAROXETINE HCL 20 MG PO TABS
40.0000 mg | ORAL_TABLET | Freq: Every day | ORAL | Status: DC
  Administered 2023-07-16 – 2023-09-11 (×55): 40 mg
  Filled 2023-07-16 (×62): qty 2

## 2023-07-16 MED ORDER — FILGRASTIM-AAFI 480 MCG/0.8ML IJ SOSY
480.0000 ug | PREFILLED_SYRINGE | Freq: Every day | INTRAMUSCULAR | Status: DC
  Administered 2023-07-16 – 2023-07-23 (×8): 480 ug via SUBCUTANEOUS
  Filled 2023-07-16 (×8): qty 0.8

## 2023-07-16 MED ORDER — FOLIC ACID 1 MG PO TABS
1.0000 mg | ORAL_TABLET | Freq: Every day | ORAL | Status: DC
  Administered 2023-07-16 – 2023-08-26 (×42): 1 mg
  Filled 2023-07-16 (×43): qty 1

## 2023-07-16 MED ORDER — SODIUM CHLORIDE 0.9 % IV SOLN: INTRAVENOUS | Status: AC | PRN

## 2023-07-16 MED ORDER — LORAZEPAM 2 MG/ML IJ SOLN
2.0000 mg | INTRAMUSCULAR | Status: DC | PRN
  Administered 2023-07-16: 2 mg via INTRAVENOUS
  Filled 2023-07-16: qty 1

## 2023-07-16 MED ORDER — CLONAZEPAM 1 MG PO TABS
1.0000 mg | ORAL_TABLET | Freq: Two times a day (BID) | ORAL | Status: DC
  Administered 2023-07-16 – 2023-07-27 (×24): 1 mg
  Filled 2023-07-16 (×24): qty 1

## 2023-07-16 MED ORDER — THIAMINE HCL 100 MG/ML IJ SOLN: 100.0000 mg | Freq: Every day | INTRAMUSCULAR | Status: DC

## 2023-07-16 MED ORDER — LACTATED RINGERS IV BOLUS
500.0000 mL | Freq: Once | INTRAVENOUS | Status: AC
  Administered 2023-07-16: 500 mL via INTRAVENOUS

## 2023-07-16 MED ORDER — FOLIC ACID 5 MG/ML IJ SOLN
1.0000 mg | Freq: Every day | INTRAMUSCULAR | Status: DC
  Filled 2023-07-16: qty 0.2

## 2023-07-16 NOTE — Progress Notes (Addendum)
 eLink Physician-Brief Progress Note Patient Name: Frank [name]. DOB: 07-23-1965 MRN: [id number]   Date of Service  07/16/2023  HPI/Events of Note  Sedation issues. Maxed Precedex . Fent @ 50, and titrating up.  PRN Ativan  worked earlier but it is Q6 & for seizures. Can we increase frequency and add instructions to be for agitation.  Camera eval done already.  - titrate up fentanyl  drip.bolus not helping.  - ativan  changed for agitation and seizures.   2. Also, high amount of stool output. Bedside wondering can she hold lactulose  dose tonight?   eICU Interventions  - hold lactulose  for now.      Intervention Category Intermediate Interventions: Other: Minor Interventions: Agitation / anxiety - evaluation and management  Rexann Catalan 07/16/2023, 8:30 PM  22:53  Camera alert Discussed with RN. MAP improving  from decreasing sedation and pain drips to half. - stat Albumin  IV once ordered. Watch closely. In synchrony with vent.  02:38 Bedside asking can you lower Ativan  dosage d/t patient's BP dropping with current dose.  Changed to lower dose of 0.5 from 2 mg earlier. If gets seizures , will need higher dose.  04:07 Type and sreen has expired. Bedside asking if you want one drawn with AM labs? T and S ordered, has anemia, stable, might need PRBC if goes under 7.   06:27 Camera evaluation done for right wrist  soft wrist restraints request from bed side RN, to prevent self injury and harm while critically ill, altered mentation. [name]

## 2023-07-16 NOTE — Progress Notes (Signed)
 [name] NEPHROLOGY PROGRESS NOTE  Assessment/ Plan: Pt is a 58 y.o. yo male with past medical history significant for HTN, HLD, type II DM, BPH, seizure who was initially presented at [facility] due to AMS, admitted for sepsis due to pneumonia, encephalopathy, sepsis, AKI, hypercalcemia symptomatic anemia and Lytic pulm lesion concern for multiple myeloma.  # AKI on CKD 3a - b/l creat 1.1- 1.5 from 2024, eGFR 53- >60 ml/min. Creat here 5.8 on presentation at OSH. AKI due to possible myeloma kidney +/- hyperCa +/- ACEi (home med). Initially treated with IV fluid and then required Lasix  because of fluid overload. Started CRRT on 4/23 w/ no systemic heparin  because of bleeding and hematoma however we are giving limited heparin  in the circuit. Pt remains anuric, and tolerating CRRT well.  Cont CRRT.   #Volume - required IV diuretics, then UF with CRRT. Still looks dry, will increase to 80 cc/hr net + (positive)   # Shock- remains on Levo and vasopressin .    # Multiple myeloma: Oncologist started Velcade  and Cytoxan .  Management per oncology team.    # Atrial fib-  on IV amio and IV diltiazem .  #Hypercalcemia -  resolved  now. Per oncology.   #Multifocal PNA - on IV abx  #Anemia - prn transfusions.  # AMS/acute metabolic encephalopathy. Per PCCM. Started CRRT for azotemia.  # Hyperuricemia: Rasburicase  and dialysis.  # Hypophosphatemia due to CRRT, repleted IV phosphorus.  Monitor lab.  Subjective: Seen and examined in ICU. BP's remains labile. Net + yesterday at 2.3 L +.    Objective Vital signs in last 24 hours: Vitals:   07/16/23 1208 07/16/23 1215 07/16/23 1230 07/16/23 1245  BP:  128/67 (!) 149/75 137/73  Pulse:  92 97 98  Resp:  18 16 15   Temp:      TempSrc:      SpO2: 100% 100% 99% 99%  Weight:      Height:        Physical Exam: General: Critically ill looking male, intubated, sedated Heart:RRR, s1s2 nl Lungs: Coarse breath sound  bilateral Abdomen:soft, Non-tender, non-distended Extremities:No edema Neurology: Frank Capron  MD  [facility] 07/16/2023, 2:20 PM  Recent Labs  Lab 07/15/23 0435 07/15/23 1614 07/16/23 0417 07/16/23 1057  HGB 8.4*  --  7.5*  --   ALBUMIN  2.2* 2.0* 2.2*  2.1*  --   CALCIUM  7.4* 7.1* 7.2*  7.1*  --   PHOS 4.7* 2.3* RESULTS UNAVAILABLE DUE TO INTERFERING SUBSTANCE RESULTS UNAVAILABLE DUE TO INTERFERING SUBSTANCE  CREATININE 2.31* 2.38* 2.14*  2.16*  --   K 4.6 4.3 4.5  4.5  --     Inpatient medications:  amiodarone   200 mg Per NG tube BID   Followed by   Frank [name] ON 07/24/2023] amiodarone   200 mg Per NG tube Daily   busPIRone   15 mg Per Tube QHS   clonazePAM  1 mg Per Tube BID   docusate  100 mg Per Tube BID   epoetin alfa  40,000 Units Subcutaneous QODAY   famotidine   10.4 mg Per Tube Daily   feeding supplement (PROSource TF20)  60 mL Per Tube TID   fentaNYL  (SUBLIMAZE ) injection  50 mcg Intravenous Once   filgrastim (NIVESTYM) SQ  480 mcg Subcutaneous Daily   folic acid  1 mg Per Tube Daily   insulin  aspart  0-20 Units Subcutaneous Q4H   insulin  aspart  6 Units Subcutaneous Q4H   insulin  glargine-yfgn  20 Units Subcutaneous  Daily   lactulose   30 g Per Tube TID   nicotine   14 mg Transdermal Daily   mouth rinse  15 mL Mouth Rinse Q2H   PARoxetine   40 mg Per Tube Daily   phenylephrine   100 mcg Intravenous Once   pneumococcal 20-valent conjugate vaccine  0.5 mL Intramuscular Tomorrow-1000   polyethylene glycol  17 g Per Tube Daily   QUEtiapine  50 mg Per Tube BID   sodium chloride  flush  10-40 mL Intracatheter Q12H   thiamine  100 mg Per Tube Daily    dexmedetomidine  (PRECEDEX ) IV infusion 1.4 mcg/kg/hr (07/16/23 1400)   feeding supplement (VITAL 1.5 CAL) 60 mL/hr at 07/16/23 1400   fentaNYL  infusion INTRAVENOUS 200 mcg/hr (07/16/23 1400)   heparin  10,000 units/ 20 mL infusion syringe 1,000 Units/hr (07/16/23 0739)   norepinephrine  (LEVOPHED ) Adult infusion 10 mcg/min  (07/16/23 1400)   piperacillin -tazobactam (ZOSYN )  IV Stopped (07/16/23 0913)   prismasol  BGK 4/2.5 400 mL/hr at 07/16/23 0952   prismasol  BGK 4/2.5 400 mL/hr at 07/16/23 1025   prismasol  BGK 4/2.5 1,500 mL/hr at 07/16/23 1323   vancomycin  Stopped (07/16/23 1323)   vasopressin  Stopped (07/16/23 1030)   acetaminophen  (TYLENOL ) oral liquid 160 mg/5 mL **OR** acetaminophen , fentaNYL , heparin , LORazepam , midazolam , mouth rinse, oxyCODONE , polyethylene glycol, polyvinyl alcohol, sodium chloride  flush

## 2023-07-16 NOTE — Plan of Care (Signed)
  Problem: Education: Goal: Knowledge of General Education information will improve Description: Including pain rating scale, medication(s)/side effects and non-pharmacologic comfort measures Outcome: Not Progressing   Problem: Elimination: Goal: Will not experience complications related to bowel motility Outcome: Progressing   Problem: Metabolic: Goal: Ability to maintain appropriate glucose levels will improve Outcome: Progressing

## 2023-07-16 NOTE — Plan of Care (Signed)
  Problem: Education: Goal: Knowledge of General Education information will improve Description: Including pain rating scale, medication(s)/side effects and non-pharmacologic comfort measures Outcome: Progressing   Problem: Health Behavior/Discharge Planning: Goal: Ability to manage health-related needs will improve Outcome: Progressing   Problem: Clinical Measurements: Goal: Ability to maintain clinical measurements within normal limits will improve Outcome: Progressing Goal: Will remain free from infection Outcome: Progressing Goal: Diagnostic test results will improve Outcome: Progressing Goal: Respiratory complications will improve Outcome: Progressing Goal: Cardiovascular complication will be avoided Outcome: Progressing   Problem: Activity: Goal: Risk for activity intolerance will decrease Outcome: Progressing   Problem: Nutrition: Goal: Adequate nutrition will be maintained Outcome: Progressing   Problem: Coping: Goal: Level of anxiety will decrease Outcome: Progressing   Problem: Elimination: Goal: Will not experience complications related to bowel motility Outcome: Progressing Goal: Will not experience complications related to urinary retention Outcome: Progressing   Problem: Pain Managment: Goal: General experience of comfort will improve and/or be controlled Outcome: Progressing   Problem: Safety: Goal: Ability to remain free from injury will improve Outcome: Progressing   Problem: Skin Integrity: Goal: Risk for impaired skin integrity will decrease Outcome: Progressing   Problem: Coping: Goal: Ability to adjust to condition or change in health will improve Outcome: Progressing   Problem: Fluid Volume: Goal: Ability to maintain a balanced intake and output will improve Outcome: Progressing   Problem: Metabolic: Goal: Ability to maintain appropriate glucose levels will improve Outcome: Progressing   Problem: Nutritional: Goal: Maintenance of  adequate nutrition will improve Outcome: Progressing Goal: Progress toward achieving an optimal weight will improve Outcome: Progressing   Problem: Skin Integrity: Goal: Risk for impaired skin integrity will decrease Outcome: Progressing   Problem: Tissue Perfusion: Goal: Adequacy of tissue perfusion will improve Outcome: Progressing   Problem: Health Behavior/Discharge Planning: Goal: Ability to identify and utilize available resources and services will improve Outcome: Not Progressing Goal: Ability to manage health-related needs will improve Outcome: Not Progressing   Problem: Safety: Goal: Non-violent Restraint(s) Outcome: Completed/Met

## 2023-07-16 NOTE — Progress Notes (Signed)
 NAME:  Frank [name]., MRN:  [id number], DOB:  [birth year], LOS: 13 ADMISSION DATE:  07/03/2023, CONSULTATION DATE:  07/06/23 REFERRING MD:  Dr Maury Space, CHIEF COMPLAINT:  Encephalopathy   History of Present Illness:  PCCM asked to see patient for encephalopathy   Transferred from [facility] with 1 week of generalized weakness, altered mentation, decreased appetite Recently being worked up for multiple myeloma.  Transferred to [facility] for oncology evaluation Nephrology consulted for AKI   Background history of obesity, asthma, obstructive sleep apnea on CPAP GERD, hypertension, hyperlipidemia Has been feeling weak for the last few days according to spouse Admitted with concern for sepsis, community-acquired pneumonia, antibiotic encephalopathy Workup revealed lytic bony lesions with concern for multiple myeloma Transfused with 3 units packed red blood cells Evaluation so far-bone marrow 07/04/2023  Pertinent  Medical History   Past Medical History:  Diagnosis Date   Anxiety    Arthritis    Asthma    Bipolar disorder (HCC)    Current every day smoker    Depression    Diabetes mellitus, type II (HCC)    Dyspnea    History of kidney stones    History of pneumonia    Hyperlipidemia    Hypertension    Morbid obesity (HCC)    Sedentary lifestyle    Seizures (HCC)    last seizure 12 yrs ago, no current problem   Sleep apnea    uses CPAP nightly   Significant Hospital Events: Including procedures, antibiotic start and stop dates in addition to other pertinent events   4/19-blood cultures, MRSA PCR negative 4/19 chest x-ray with mild pulmonary congestion 4/20 atrial fibrillation with RVR 4/21 nosebleed after IR NG attempt, NG placed by PCCM in afternoon, stable transferred to [facility] 4/22 intubated; some abla requiring prbcs; ct abd/pelvis showing acute groin hematoma tracking from posterior presumed from site of bone marrow biopsy; heparin  held EEG 4/25 >> moderate to severe  encephalopathy without any seizures or epileptiform discharges 4/23 respiratory culture >> MRSE 4/28 remains intubated on CRRT, down to one pressor  4/29 labile BP, NE back up. Responded a bit to fluids. Received Velcade  and Cytoxan  chemotherapy  Interim History / Subjective:  Agitated overnight despite max precedex  and fentanyl . Received 1mg  Versed  bolus with drop in BP requiring NE and vaso. NE and vaso now off and SBP 150s. Per RN, BP is very labile and certainly spikes when he is agitated but occasionally also while he is sleeping.  Objective   Blood pressure (!) 134/93, pulse (!) 112, temperature (!) 101.2 F (38.4 C), temperature source Axillary, resp. rate (!) 30, height 6' 0.01" (1.829 m), weight 97.8 kg, SpO2 98%.    Vent Mode: PRVC FiO2 (%):  [30 %] 30 % Set Rate:  [20 bmp] 20 bmp Vt Set:  [650 mL] 650 mL PEEP:  [5 cmH20] 5 cmH20 Pressure Support:  [10 cmH20] 10 cmH20 Plateau Pressure:  [15 cmH20-19 cmH20] 19 cmH20   Intake/Output Summary (Last 24 hours) at 07/16/2023 0753 Last data filed at 07/16/2023 0700 Gross per 24 hour  Intake 4934.72 ml  Output 2840 ml  Net 2094.72 ml   Filed Weights   07/14/23 0503 07/15/23 0340 07/16/23 0500  Weight: 93.6 kg 94.7 kg 97.8 kg   Physical Exam: General:  critically ill M, intubated and sedated. Intermittently agitated at times. HEENT: MM dry, ETT in place Neuro: Sedated, not responsive CV: RRR, no M/R/G PULM:  mildly diminished air entry bilateral bases without wheezing or  rhonchi GI: soft, bsx4 active  Extremities: warm/dry Skin: + skin tenting still, no rashes or lesions  Micro: 4/28 BCx >  4/23 respiratory cx> staph epi, multi-resistant  4/19 Bcx2>NGTD   Assessment & Plan:    Acute hypoxemic respiratory failure: Most likely in the setting of encephalopathy and aspiration of oropharyngeal secretions with appearance of bloody frothy secretions through trachea during intubation.  MRSE noted in sputum. Also possible  combination of volume overload in setting of renal failure and blood products, increased fluid load from chemotherapy etc. 4/29 appears volume down. Plan: -Continue full vent support. -SBT as able, mental status remains barrier thus far -titrate Vent setting to maintain SpO2 greater than or equal to 90%. -HOB elevated 30 degrees. -Plateau pressures less than 30 cm H20.  -Follow chest x-ray, ABG prn.   -Bronchial hygiene and RT/bronchodilator protocol. -Continue vanc/zosyn . -CXR intermittently.  Shock - multifactorial, seems hypovolemia at play along with probable MRSE PNA (only culprit as a source thus far). Echo 4/21 reassuring. -Continue Vanc/Zosyn . -Add additional 500cc LR bolus today, still seems dry. -Follow repeat BCx's 4/28, NGTD.  Multiple myeloma Suspected tumor lysis - labs improved with CRRT, doubtful. -Oncology on board, appreciate the assistance. -Appreciate hematology/oncology assistance.  Received Velcade  and Cytotoxan 4/29, next Velcade  5/2.   Paroxysmal atrial fibrillation with RVR - converted to NSR/ST 4/22 -Anticoagulation on hold, if has recurrence then will need to resume  -Continue amiodarone  po (amio 200mg  BID x 2 weeks, then 200mg  daily x 2 weeks, then stop) -Appreciate cardiology assistance  AKI: Creatinine, BUN slowly rising but urine output appears to be picking up which is encouraging -Nephrology following, appreciate the assistance. -Continue CRRT, run at even. -Follow BMP, electrolytes.   ABLA from right groin hematoma:  Related to myeloma, lab checks, prior hydration with hypercalcemia, hematoma tracks from site of bone marrow biopsy down to thigh on CT. Hgb now stable.  -Transfuse for Hgb < 7.   Acute metabolic encephalopathy - Multifactorial, sepsis, hypercalcemia, myeloma ICU delirium - Add scheduled Klonopin and Seroquel enterally to try and help minimize IV infusions (precedex  and fentanyl  currently). Mental status and high doses IV sedation  remain barrier to SBT and consideration extubation. - Resume PTA Buspar , Paxil   Hyperammonemia History of seizure disorder - No evidence seizures on EEG 4/25.   - Holding Depakote  given hyperammonemia. -Continue lactulose . - Minimize sedation. - Following intermittent ammonia.    History of obstructive sleep apnea - CPAP at night if improves/once extubated   Hypoalbuminemia -Tube feeding RD   T2DM -Continue SSI, Semglee   Depression/anxiety - Resume PTA Buspar , Paxil    Peripheral neuropathy - Continue to hold gabapentin    Best Practice (right click and "Reselect all SmartList Selections" daily)   Diet/type: tubefeeds DVT prophylaxis SCD Pressure ulcer(s): N/A GI prophylaxis: H2B Lines: Central line Foley:  Yes, and it is still needed Code Status:  full code Last date of multidisciplinary goals of care discussion [family update pending 4/28]   CC time: 35 min.   Rafael Bun, PA - [facility] For pager details, please see AMION or use Epic chat  After 1900, please call [facility] for cross coverage needs 07/16/2023, 7:53 AM

## 2023-07-16 NOTE — Progress Notes (Signed)
 Frank [name] did receive chemotherapy yesterday.  He had both Velcade  and Cytoxan .  Unfortunately, his overall status probably is not that much better.  He gets very agitated.  He is not able to come off the vent.  He is on CRRT.  I think he is tolerating treatment well for the myeloma.  His protein level today is 10.7.  His ammonia is still quite high.  He must have some underlying hepatic dysfunction.  His BUN and creatinine are 58 and 2.16.  His blood sugar is on the high side at 155.  His white cell count is 1.8.  Hemoglobin 7.5.  Platelet count 109,000.  ANC is 0.6.  I probably would get him on some Neupogen.  He is on Zosyn .  He is on both norepinephrine  and vasopressin .  His blood pressure today is pretty good.  However when these are weaned down, his blood pressure drops.  His vital signs show temperature of 100.  Pulse 105.  Blood pressure 157/70.  He is getting tube feeds.  At least he is getting some nutrition.  Overall, he is really no change in his physical exam.  His lungs sound relatively clear bilaterally.  He has occasional wheezes.  He does have decent air movement.  Cardiac exam tachycardic but regular.  He does have an occasional extra beat.  Abdomen is obese but soft.  Bowel sounds are present but slightly decreased.  There is no palpable liver or spleen tip.  Extremities shows some mild edema in the legs.  I know that Frank [name] has quite a few health issues.  I know that myeloma is part of the issue.  He had his chemotherapy yesterday.  He will be due for his Velcade  on Friday.  Hopefully, he will be able to come off the ventilator.  I know the staff in the ICU are working really hard on him.  I know this is incredibly complicated.   [name], MD  [place] 18:14

## 2023-07-17 ENCOUNTER — Encounter (HOSPITAL_COMMUNITY): Payer: Self-pay | Admitting: Hematology & Oncology

## 2023-07-17 ENCOUNTER — Inpatient Hospital Stay (HOSPITAL_COMMUNITY)

## 2023-07-17 DIAGNOSIS — N179 Acute kidney failure, unspecified: Secondary | ICD-10-CM | POA: Diagnosis not present

## 2023-07-17 DIAGNOSIS — J9601 Acute respiratory failure with hypoxia: Secondary | ICD-10-CM | POA: Diagnosis not present

## 2023-07-17 DIAGNOSIS — R531 Weakness: Secondary | ICD-10-CM | POA: Diagnosis not present

## 2023-07-17 DIAGNOSIS — C9000 Multiple myeloma not having achieved remission: Secondary | ICD-10-CM | POA: Diagnosis not present

## 2023-07-17 DIAGNOSIS — R579 Shock, unspecified: Secondary | ICD-10-CM | POA: Diagnosis not present

## 2023-07-17 LAB — CBC WITH DIFFERENTIAL/PLATELET
Abs Immature Granulocytes: 0.02 10*3/uL (ref 0.00–0.07)
Basophils Absolute: 0 10*3/uL (ref 0.0–0.1)
Basophils Relative: 1 %
Eosinophils Absolute: 0 10*3/uL (ref 0.0–0.5)
Eosinophils Relative: 3 %
HCT: 22.5 % — ABNORMAL LOW (ref 39.0–52.0)
Hemoglobin: 6.8 g/dL — CL (ref 13.0–17.0)
Immature Granulocytes: 3 %
Lymphocytes Relative: 64 %
Lymphs Abs: 0.5 10*3/uL — ABNORMAL LOW (ref 0.7–4.0)
MCH: 29.6 pg (ref 26.0–34.0)
MCHC: 30.2 g/dL (ref 30.0–36.0)
MCV: 97.8 fL (ref 80.0–100.0)
Monocytes Absolute: 0.1 10*3/uL (ref 0.1–1.0)
Monocytes Relative: 13 %
Neutro Abs: 0.1 10*3/uL — CL (ref 1.7–7.7)
Neutrophils Relative %: 16 %
Platelets: 90 10*3/uL — ABNORMAL LOW (ref 150–400)
RBC: 2.3 MIL/uL — ABNORMAL LOW (ref 4.22–5.81)
RDW: 19.5 % — ABNORMAL HIGH (ref 11.5–15.5)
WBC: 0.7 10*3/uL — CL (ref 4.0–10.5)
nRBC: 111.4 % — ABNORMAL HIGH (ref 0.0–0.2)

## 2023-07-17 LAB — COMPREHENSIVE METABOLIC PANEL WITH GFR
ALT: 47 U/L — ABNORMAL HIGH (ref 0–44)
AST: 26 U/L (ref 15–41)
Albumin: 2.1 g/dL — ABNORMAL LOW (ref 3.5–5.0)
Alkaline Phosphatase: 38 U/L (ref 38–126)
Anion gap: 6 (ref 5–15)
BUN: 52 mg/dL — ABNORMAL HIGH (ref 6–20)
CO2: 25 mmol/L (ref 22–32)
Calcium: 7.3 mg/dL — ABNORMAL LOW (ref 8.9–10.3)
Chloride: 99 mmol/L (ref 98–111)
Creatinine, Ser: 1.96 mg/dL — ABNORMAL HIGH (ref 0.61–1.24)
GFR, Estimated: 39 mL/min — ABNORMAL LOW (ref >60)
Glucose, Bld: 189 mg/dL — ABNORMAL HIGH (ref 70–99)
Potassium: 4.8 mmol/L (ref 3.5–5.1)
Sodium: 130 mmol/L — ABNORMAL LOW (ref 135–145)
Total Bilirubin: 0.7 mg/dL (ref 0.0–1.2)
Total Protein: 9.9 g/dL — ABNORMAL HIGH (ref 6.5–8.1)

## 2023-07-17 LAB — RENAL FUNCTION PANEL
Albumin: 2.1 g/dL — ABNORMAL LOW (ref 3.5–5.0)
Albumin: 2 g/dL — ABNORMAL LOW (ref 3.5–5.0)
Anion gap: 6 (ref 5–15)
Anion gap: 5 (ref 5–15)
BUN: 52 mg/dL — ABNORMAL HIGH (ref 6–20)
BUN: 50 mg/dL — ABNORMAL HIGH (ref 6–20)
CO2: 24 mmol/L (ref 22–32)
CO2: 24 mmol/L (ref 22–32)
Calcium: 7.3 mg/dL — ABNORMAL LOW (ref 8.9–10.3)
Calcium: 7 mg/dL — ABNORMAL LOW (ref 8.9–10.3)
Chloride: 99 mmol/L (ref 98–111)
Chloride: 101 mmol/L (ref 98–111)
Creatinine, Ser: 1.95 mg/dL — ABNORMAL HIGH (ref 0.61–1.24)
Creatinine, Ser: 2.1 mg/dL — ABNORMAL HIGH (ref 0.61–1.24)
GFR, Estimated: 39 mL/min — ABNORMAL LOW (ref >60)
GFR, Estimated: 36 mL/min — ABNORMAL LOW (ref >60)
Glucose, Bld: 189 mg/dL — ABNORMAL HIGH (ref 70–99)
Glucose, Bld: 233 mg/dL — ABNORMAL HIGH (ref 70–99)
Phosphorus: 2.7 mg/dL (ref 2.5–4.6)
Potassium: 4.8 mmol/L (ref 3.5–5.1)
Potassium: 4.8 mmol/L (ref 3.5–5.1)
Sodium: 129 mmol/L — ABNORMAL LOW (ref 135–145)
Sodium: 130 mmol/L — ABNORMAL LOW (ref 135–145)

## 2023-07-17 LAB — CBC
HCT: 22.3 % — ABNORMAL LOW (ref 39.0–52.0)
Hemoglobin: 6.8 g/dL — CL (ref 13.0–17.0)
MCH: 29.8 pg (ref 26.0–34.0)
MCHC: 30.5 g/dL (ref 30.0–36.0)
MCV: 97.8 fL (ref 80.0–100.0)
Platelets: 81 10*3/uL — ABNORMAL LOW (ref 150–400)
RBC: 2.28 MIL/uL — ABNORMAL LOW (ref 4.22–5.81)
RDW: 18.9 % — ABNORMAL HIGH (ref 11.5–15.5)
WBC: 0.5 10*3/uL — CL (ref 4.0–10.5)
nRBC: 142.9 % — ABNORMAL HIGH (ref 0.0–0.2)

## 2023-07-17 LAB — GLUCOSE, CAPILLARY
Glucose-Capillary: 127 mg/dL — ABNORMAL HIGH (ref 70–99)
Glucose-Capillary: 157 mg/dL — ABNORMAL HIGH (ref 70–99)
Glucose-Capillary: 170 mg/dL — ABNORMAL HIGH (ref 70–99)
Glucose-Capillary: 172 mg/dL — ABNORMAL HIGH (ref 70–99)
Glucose-Capillary: 180 mg/dL — ABNORMAL HIGH (ref 70–99)
Glucose-Capillary: 182 mg/dL — ABNORMAL HIGH (ref 70–99)
Glucose-Capillary: 188 mg/dL — ABNORMAL HIGH (ref 70–99)
Glucose-Capillary: 204 mg/dL — ABNORMAL HIGH (ref 70–99)

## 2023-07-17 LAB — LACTATE DEHYDROGENASE: LDH: 302 U/L — ABNORMAL HIGH (ref 98–192)

## 2023-07-17 LAB — PREPARE RBC (CROSSMATCH)

## 2023-07-17 LAB — HEMOGLOBIN AND HEMATOCRIT, BLOOD
HCT: 24.7 % — ABNORMAL LOW (ref 39.0–52.0)
Hemoglobin: 7.6 g/dL — ABNORMAL LOW (ref 13.0–17.0)

## 2023-07-17 LAB — AMMONIA: Ammonia: 35 umol/L (ref 9–35)

## 2023-07-17 LAB — PHOSPHORUS: Phosphorus: 2.5 mg/dL (ref 2.5–4.6)

## 2023-07-17 LAB — APTT: aPTT: 34 s (ref 24–36)

## 2023-07-17 LAB — MAGNESIUM: Magnesium: 2.7 mg/dL — ABNORMAL HIGH (ref 1.7–2.4)

## 2023-07-17 MED ORDER — SODIUM CHLORIDE 0.9 % IV SOLN
1.0000 g | INTRAVENOUS | Status: DC
  Administered 2023-07-18 – 2023-07-19 (×2): 1 g via INTRAVENOUS
  Filled 2023-07-17 (×2): qty 20

## 2023-07-17 MED ORDER — SODIUM CHLORIDE 0.9% IV SOLUTION: Freq: Once | INTRAVENOUS | Status: AC

## 2023-07-17 MED ORDER — PROPOFOL 1000 MG/100ML IV EMUL
5.0000 ug/kg/min | INTRAVENOUS | Status: DC
  Administered 2023-07-17: 5 ug/kg/min via INTRAVENOUS
  Administered 2023-07-18: 35 ug/kg/min via INTRAVENOUS
  Administered 2023-07-18: 55 ug/kg/min via INTRAVENOUS
  Administered 2023-07-18 – 2023-07-19 (×3): 35 ug/kg/min via INTRAVENOUS
  Administered 2023-07-19: 45 ug/kg/min via INTRAVENOUS
  Administered 2023-07-19 – 2023-07-20 (×5): 35 ug/kg/min via INTRAVENOUS
  Filled 2023-07-17 (×12): qty 100

## 2023-07-17 MED ORDER — FILGRASTIM 480 MCG/1.6ML IJ SOLN
480.0000 ug | Freq: Every day | INTRAMUSCULAR | Status: DC
  Filled 2023-07-17: qty 1.6

## 2023-07-17 MED ORDER — LACTULOSE 10 GM/15ML PO SOLN
20.0000 g | Freq: Every day | ORAL | Status: DC
  Administered 2023-07-18 – 2023-07-22 (×5): 20 g
  Filled 2023-07-17 (×5): qty 30

## 2023-07-17 MED ORDER — SODIUM CHLORIDE 0.9 % IV SOLN
1.0000 g | Freq: Two times a day (BID) | INTRAVENOUS | Status: DC
  Administered 2023-07-17: 1 g via INTRAVENOUS
  Filled 2023-07-17: qty 20

## 2023-07-17 MED ORDER — LORAZEPAM 2 MG/ML IJ SOLN
0.5000 mg | INTRAMUSCULAR | Status: DC | PRN
  Administered 2023-07-17 (×2): 0.5 mg via INTRAVENOUS
  Filled 2023-07-17 (×2): qty 1

## 2023-07-17 MED ORDER — EPOETIN ALFA 20000 UNIT/ML IJ SOLN
40000.0000 [IU] | INTRAMUSCULAR | Status: AC
  Administered 2023-07-18 – 2023-07-28 (×6): 40000 [IU] via SUBCUTANEOUS
  Filled 2023-07-17 (×6): qty 2

## 2023-07-17 NOTE — Progress Notes (Signed)
 NAME:  Frank Standage., MRN:  [id number], DOB:  [birth year], LOS: 14 ADMISSION DATE:  07/03/2023, CONSULTATION DATE:  07/06/23 REFERRING MD:  Dr Maury Space, CHIEF COMPLAINT:  Encephalopathy   History of Present Illness:  PCCM asked to see patient for encephalopathy   Transferred from [facility] with 1 week of generalized weakness, altered mentation, decreased appetite Recently being worked up for multiple myeloma.  Transferred to [facility] for oncology evaluation Nephrology consulted for AKI   Background history of obesity, asthma, obstructive sleep apnea on CPAP GERD, hypertension, hyperlipidemia Has been feeling weak for the last few days according to spouse Admitted with concern for sepsis, community-acquired pneumonia, antibiotic encephalopathy Workup revealed lytic bony lesions with concern for multiple myeloma Transfused with 3 units packed red blood cells Evaluation so far-bone marrow 07/04/2023  Pertinent  Medical History   Past Medical History:  Diagnosis Date   Anxiety    Arthritis    Asthma    Bipolar disorder (HCC)    Current every day smoker    Depression    Diabetes mellitus, type II (HCC)    Dyspnea    History of kidney stones    History of pneumonia    Hyperlipidemia    Hypertension    Morbid obesity (HCC)    Sedentary lifestyle    Seizures (HCC)    last seizure 12 yrs ago, no current problem   Sleep apnea    uses CPAP nightly   Significant Hospital Events: Including procedures, antibiotic start and stop dates in addition to other pertinent events   4/19-blood cultures, MRSA PCR negative 4/19 chest x-ray with mild pulmonary congestion 4/20 atrial fibrillation with RVR 4/21 nosebleed after IR NG attempt, NG placed by PCCM in afternoon, stable transferred to [facility] 4/22 intubated; some abla requiring prbcs; ct abd/pelvis showing acute groin hematoma tracking from posterior presumed from site of bone marrow biopsy; heparin  held EEG 4/25 >> moderate to severe  encephalopathy without any seizures or epileptiform discharges 4/23 respiratory culture >> MRSE 4/28 remains intubated on CRRT, down to one pressor  4/29 labile BP, NE back up. Responded a bit to fluids. Received Velcade  and Cytoxan  chemotherapy 5/1 remains encephalopathic, check CTH  Interim History / Subjective:  Remains agitated and became hypotensive last night with sedation bolus Encephalopathy remains a barrier to progressing towards extubation   Objective   Blood pressure 136/75, pulse (!) 104, temperature 99.7 F (37.6 C), temperature source Axillary, resp. rate (!) 32, height 6' 0.01" (1.829 m), weight 97.9 kg, SpO2 99%.    Vent Mode: PRVC FiO2 (%):  [30 %] 30 % Set Rate:  [20 bmp] 20 bmp Vt Set:  [650 mL] 650 mL PEEP:  [5 cmH20] 5 cmH20 Plateau Pressure:  [17 cmH20-21 cmH20] 21 cmH20   Intake/Output Summary (Last 24 hours) at 07/17/2023 0855 Last data filed at 07/17/2023 0830 Gross per 24 hour  Intake 4222.62 ml  Output 1878.9 ml  Net 2343.72 ml   Filed Weights   07/15/23 0340 07/16/23 0500 07/17/23 0500  Weight: 94.7 kg 97.8 kg 97.9 kg   Physical Exam: General:  critically ill M, intubated and sedated, mildly agitated on precedex  and fentanyl   HEENT: MM dry, ETT in place Neuro: Sedated, not responsive CV: RRR, no M/R/G PULM:  mildly diminished air entry bilateral bases without wheezing or rhonchi GI: soft, bsx4 active  Extremities: warm/dry, no erythema or edema  Skin: + skin tenting still, no rashes or lesions  Micro: 4/28 BCx > NGTD 4/23  respiratory cx> staph epi, multi-resistant, rare budding yeast 4/19 Bcx2>NGTD   Assessment & Plan:    Acute hypoxemic respiratory failure:  Suspect aspiration and MSSE pneumonia Continued fevers  Plan: -Continue full vent support. -SBT as able, mental status remains barrier thus far -titrate Vent setting to maintain SpO2 greater than or equal to 90%. -HOB elevated 30 degrees. -Plateau pressures less than 30 cm H20.   -Follow chest x-ray, ABG prn.   -Bronchial hygiene and RT/bronchodilator protocol. -Continue vancomycin , zosyn  changed to Meropenem  by Oncology -CXR intermittently.  Shock - multifactorial, seems hypovolemia at play along with probable MRSE PNA (only culprit as a source thus far). Echo 4/21 reassuring. Neutropenia  -Continue Vanc/Mero -Repeat blood cultures without growth -consider LP   Multiple myeloma Suspected tumor lysis - labs improved with CRRT, so less suspicious  -Oncology on board, appreciate the assistance. -Appreciate hematology/oncology assistance.  Received Velcade  and Cytotoxan 4/29, next Velcade  5/2, though may have to hold per oncology   Paroxysmal atrial fibrillation with RVR - converted to NSR/ST 4/22 -Anticoagulation on hold, if has recurrence then will need to resume  -Continue amiodarone  po (amio 200mg  BID x 2 weeks, then 200mg  daily x 2 weeks, then stop) -Appreciate cardiology assistance  AKI:  UOP remains poor  -Nephrology following, appreciate the assistance. -Continue CRRT, run at even. -Follow BMP, electrolytes.   ABLA from right groin hematoma:  Related to myeloma, lab checks, prior hydration with hypercalcemia, hematoma tracks from site of bone marrow biopsy down to thigh on CT. Hgb now stable.  -Transfuse for Hgb < 7.   Acute metabolic encephalopathy - Multifactorial, sepsis, hypercalcemia, myeloma ICU delirium - scheduled Klonopin  and Seroquel  enterally to try and help minimize IV infusions (precedex  and fentanyl  currently). Mental status and high doses IV sedation remain barrier to SBT and consideration extubation. - Resume PTA Buspar , Paxil  -encephalopathy not improving with improving ammonia and enteral medications, EEG was negative, check CT head today  Hyperammonemia History of seizure disorder - No evidence seizures on EEG 4/25.   - Holding Depakote  given hyperammonemia. -Continue lactulose . - Minimize sedation. - Following intermittent  ammonia, improving     History of obstructive sleep apnea - CPAP at night if improves/once extubated   Hypoalbuminemia -Tube feeding RD   T2DM -Continue SSI, Semglee   Depression/anxiety - Resume PTA Buspar , Paxil    Peripheral neuropathy - Continue to hold gabapentin    Best Practice (right click and "Reselect all SmartList Selections" daily)   Diet/type: tubefeeds DVT prophylaxis SCD Pressure ulcer(s): N/A GI prophylaxis: H2B Lines: Central line Foley:  Yes, and it is still needed Code Status:  full code Last date of multidisciplinary goals of care discussion [family update pending 4/28]  CRITICAL CARE Performed by: [name] Colbi Staubs   Total critical care time: 40 minutes  Critical care time was exclusive of separately billable procedures and treating other patients.  Critical care was necessary to treat or prevent imminent or life-threatening deterioration.  Critical care was time spent personally by me on the following activities: development of treatment plan with patient and/or surrogate as well as nursing, discussions with consultants, evaluation of patient's response to treatment, examination of patient, obtaining history from patient or surrogate, ordering and performing treatments and interventions, ordering and review of laboratory studies, ordering and review of radiographic studies, pulse oximetry and re-evaluation of patient's condition.   [name] Addylin Manke, PA-C  Pulmonary & Critical care See Amion for pager If no response to pager , please call [phone] until 7pm  After 7:00 pm call Elink  [phone]

## 2023-07-17 NOTE — Progress Notes (Signed)
 Pharmacy Antibiotic Note  Frank [name]. is a 58 y.o. male admitted on 07/03/2023 with AMS, hypercalcemia of malignancy, new MM diagnosis as a transfer from OSH.  Pharmacy has been consulted for Vanco dosing for pneumonia.    Fever: Tm 101.2 (4/30 at 8am), Tc 100.3 WBC low, worsening neutropenia at ANC 0.1 (filgrastim  started 4/30>>)  Respiratory culture w/ MRSE, repeat culture in progress PCCM planning LP on 5/1 for AMS, neutropenia.   Dr Maria Shiner changed Zosyn  to meropenem    Plan: Renally adjust Meropenem  to 1g IV q24h for CRRT dosing.  Continue Vancomycin  1000 mg IV q24h for CRRT dosing.  Measure Vanc levels as needed.  Goal AUC = 400 - 550.  Follow up renal function, culture results, and clinical course.   Height: 6' 0.01" (182.9 cm) Weight: 97.9 kg (215 lb 13.3 oz) IBW/kg (Calculated) : 77.62  Temp (24hrs), Avg:99.4 F (37.4 C), Min:97.1 F (36.2 C), Max:101.2 F (38.4 C)  Recent Labs  Lab 07/13/23 0505 07/13/23 1728 07/14/23 0524 07/15/23 0435 07/15/23 1614 07/16/23 0417 07/16/23 1611 07/17/23 0441  WBC 5.6  --  4.6 3.4*  --  1.8*  --  0.7*  CREATININE 2.47*  2.38*   < > 2.35* 2.31* 2.38* 2.14*  2.16* 2.05* 1.95*  1.96*   < > = values in this interval not displayed.    Estimated Creatinine Clearance: 49.4 mL/min (A) (by C-G formula based on SCr of 1.95 mg/dL (H)).    Allergies  Allergen Reactions   Morphine And Codeine Anaphylaxis   Shellfish Allergy Anaphylaxis   Betadine  [Povidone Iodine ] Itching   Bupropion Other (See Comments)    Shaking of the body, hallucinations    Chlorhexidine  Hives    Patient reports never had issues CHG with mouth rinse.   Influenza Vaccines Hives   Metoprolol  Rash   Antimicrobials this admission:  4/17 Azith >> 4/20  4/17 CTX >> 4/22 4/18 Fluconazole  > 4/23 4/22 Zosyn  >> 5/1 4/28: Vanco>> 5/1 Meropenem  >>   Microbiology results:  4/19: MRSA PCR: neg 4/19 UCx: ngF 4/19 BCx: ngF 4/23 TA: few Staph epi:  MRSE 4/28 BCx: ngtd 4/30 TA: abundant GPC pairs (pending)    Kendall Pauls PharmD, BCPS [facility] main pharmacy [phone] 07/17/2023 7:43 AM

## 2023-07-17 NOTE — Progress Notes (Signed)
 Pt was transported to CT on vent and back with no complications.

## 2023-07-17 NOTE — Plan of Care (Signed)
  Problem: Clinical Measurements: Goal: Will remain free from infection Outcome: Progressing Goal: Diagnostic test results will improve Outcome: Progressing Goal: Respiratory complications will improve Outcome: Progressing Goal: Cardiovascular complication will be avoided Outcome: Progressing   Problem: Activity: Goal: Risk for activity intolerance will decrease Outcome: Progressing   Problem: Nutrition: Goal: Adequate nutrition will be maintained Outcome: Progressing   Problem: Elimination: Goal: Will not experience complications related to bowel motility Outcome: Progressing Goal: Will not experience complications related to urinary retention Outcome: Progressing   Problem: Safety: Goal: Ability to remain free from injury will improve Outcome: Progressing   Problem: Skin Integrity: Goal: Risk for impaired skin integrity will decrease Outcome: Progressing   Problem: Education: Goal: Knowledge of General Education information will improve Description: Including pain rating scale, medication(s)/side effects and non-pharmacologic comfort measures Outcome: Not Progressing   Problem: Health Behavior/Discharge Planning: Goal: Ability to manage health-related needs will improve Outcome: Not Progressing   Problem: Clinical Measurements: Goal: Ability to maintain clinical measurements within normal limits will improve Outcome: Not Progressing   Problem: Coping: Goal: Level of anxiety will decrease Outcome: Not Progressing   Problem: Pain Managment: Goal: General experience of comfort will improve and/or be controlled Outcome: Not Progressing

## 2023-07-17 NOTE — Progress Notes (Signed)
 [facility] NEPHROLOGY PROGRESS NOTE  Assessment/ Plan: Pt is a 58 y.o. yo male with past medical history significant for HTN, HLD, type II DM, BPH, seizure who was initially presented at [facility] due to AMS, admitted for sepsis due to pneumonia, encephalopathy, sepsis, AKI, hypercalcemia symptomatic anemia and Lytic pulm lesion concern for multiple myeloma.  # AKI on CKD 3a - b/l creat 1.1- 1.5 from 2024, eGFR 53- >60 ml/min. Creat here 5.8 on presentation at OSH. AKI due to possible myeloma kidney +/- hyperCa +/- ACEi (home med). Initially treated with IV fluid and then required Lasix  because of fluid overload. Started CRRT on 4/23 w/ no systemic heparin  because of bleeding and hematoma however we are giving limited heparin  in the circuit. Pt remains anuric, and tolerating CRRT well.  Cont CRRT.   #Volume - required IV diuretics, then UF with CRRT.  Lower to +60 cc/hr, keeping positive.   # Shock- remains on Levo and vasopressin .    # Multiple myeloma: Oncologist started Velcade  and Cytoxan .  Management per oncology team.    # Atrial fib-  on IV amio and IV diltiazem .  #Hypercalcemia -  resolved  now. Per oncology.   #Multifocal PNA - on IV abx  #Anemia - prn transfusions.  # AMS/acute metabolic encephalopathy. Per PCCM. Started CRRT for azotemia. Now waking up unfortunately.   # Hyperuricemia: s/p rasburicase , +dialysis.  # Hypophosphatemia: due to CRRT, repleted IV phosphorus.  Monitor lab.  Subjective: Seen and examined in ICU I/O yest were +1.9L, wt's stable at a 97kg    Objective Vital signs in last 24 hours: Vitals:   07/17/23 1145 07/17/23 1200 07/17/23 1215 07/17/23 1230  BP: (!) 95/56 121/62 (!) 99/49 101/65  Pulse: 86 87 86 91  Resp: 19 18 20 18   Temp:      TempSrc:      SpO2: 96% 97% 97% 100%  Weight:      Height:        Physical Exam: General: Critically ill looking male, intubated, sedated Heart:RRR, s1s2 nl Lungs: Coarse  breath sound bilateral Abdomen:soft, Non-tender, non-distended Extremities:No edema Neurology: not responsive, on vent   Frank Ginger Leeth  MD  [facility] 07/17/2023, 1:02 PM  Recent Labs  Lab 07/16/23 0417 07/16/23 1057 07/16/23 1611 07/17/23 0441  HGB 7.5*  --   --  6.8*  ALBUMIN  2.2*  2.1*  --  1.9* 2.1*  2.1*  CALCIUM  7.2*  7.1*  --  7.1* 7.3*  7.3*  PHOS RESULTS UNAVAILABLE DUE TO INTERFERING SUBSTANCE   < > 2.1* 2.7  2.5  CREATININE 2.14*  2.16*  --  2.05* 1.95*  1.96*  K 4.5  4.5  --  3.9 4.8  4.8   < > = values in this interval not displayed.    Inpatient medications:  amiodarone   200 mg Per NG tube BID   Followed by   [name] ON 07/24/2023] amiodarone   200 mg Per NG tube Daily   busPIRone   15 mg Per Tube QHS   clonazePAM   1 mg Per Tube BID   docusate  100 mg Per Tube BID   [START ON 07/18/2023] epoetin  alfa  40,000 Units Subcutaneous QODAY   famotidine   10.4 mg Per Tube Daily   feeding supplement (PROSource TF20)  60 mL Per Tube TID   fentaNYL  (SUBLIMAZE ) injection  50 mcg Intravenous Once   filgrastim  (NIVESTYM ) SQ  480 mcg Subcutaneous Daily   folic acid   1 mg Per Tube  Daily   insulin  aspart  0-20 Units Subcutaneous Q4H   insulin  aspart  6 Units Subcutaneous Q4H   insulin  glargine-yfgn  20 Units Subcutaneous Daily   [START ON 07/18/2023] lactulose   20 g Per Tube Daily   nicotine   14 mg Transdermal Daily   mouth rinse  15 mL Mouth Rinse Q2H   PARoxetine   40 mg Per Tube Daily   pneumococcal 20-valent conjugate vaccine  0.5 mL Intramuscular Tomorrow-1000   polyethylene glycol  17 g Per Tube Daily   QUEtiapine   50 mg Per Tube BID   sodium chloride  flush  10-40 mL Intracatheter Q12H   thiamine   100 mg Per Tube Daily    sodium chloride  Stopped (07/17/23 0618)   sodium chloride  10 mL/hr at 07/17/23 1229   dexmedetomidine  (PRECEDEX ) IV infusion 1.4 mcg/kg/hr (07/17/23 1229)   feeding supplement (VITAL 1.5 CAL) 60 mL/hr at 07/17/23 1229   fentaNYL  infusion INTRAVENOUS 150  mcg/hr (07/17/23 1249)   heparin  10,000 units/ 20 mL infusion syringe 1,000 Units/hr (07/17/23 1229)   [START ON 07/18/2023] meropenem  (MERREM ) IV     norepinephrine  (LEVOPHED ) Adult infusion 11 mcg/min (07/17/23 1229)   prismasol  BGK 4/2.5 400 mL/hr at 07/16/23 2250   prismasol  BGK 4/2.5 400 mL/hr at 07/16/23 2250   prismasol  BGK 4/2.5 1,500 mL/hr at 07/17/23 0225   vancomycin  Stopped (07/16/23 1323)   vasopressin  0.03 Units/min (07/17/23 1229)   sodium chloride , sodium chloride , acetaminophen  (TYLENOL ) oral liquid 160 mg/5 mL **OR** acetaminophen , fentaNYL , heparin , LORazepam , mouth rinse, oxyCODONE , polyethylene glycol, polyvinyl alcohol , sodium chloride  flush

## 2023-07-17 NOTE — Progress Notes (Signed)
  Overall, his status really is not much changed.  I do think that the chemotherapy is helping.  His total protein is now down to 9.9.  Albumin  is 2.1.  Calcium  is 7.3.  I have to believe that the chemotherapy is starting to affect the myeloma in a good way.  He unfortunately does have significant cytopenia from the chemotherapy.  Today, white cell count is 700.  Hemoglobin 6.8.  Platelet count 90,000.  He is getting Procrit .  I will go ahead and get him on some Neupogen ..  We may have to hold off on his Velcade  for tomorrow.  I think we should change his antibiotic to Merrem .  He is still having some temperatures I think.  I think cultures are negative.  He continues on CRRT.  He is still quite agitated.  He is on pressors.  When he comes off pressors if blood pressure drops quite a bit.  Again I know he has other health issues.  His sodium is 129.  Potassium 4.8.  His blood sugar is 189.  BUN 52 creatinine 1.95.  Magnesium  is 2.7.   Vital signs show temperature of 100.1.  Pulse 110.  Blood pressure 121/66.  The Tmax was 101.2.  I think he did have some cultures taken yesterday.  I will go ahead and give him 1 unit of blood today.  Overall, there is really no change in his physical exam.  I do not see any swelling in the legs.  He has no rashes.  His lungs sound decent bilaterally.  He has decent air movement bilaterally.  Cardiac exam tachycardic but regular.  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  The total protein is coming down.  There is no problems with hypercalcemia.  He never had hyperviscosity.  He still does have mental status issues.  Again I just wonder what else might be going on.  Again we will have to hold on his Velcade  tomorrow.  I really do not think we have to "push or luck" and try to treat.  We will see how the Neupogen  works.  I know the ICU staff are working really hard to try to get him better.   [name], MD  Psalm 91:14

## 2023-07-17 NOTE — Progress Notes (Signed)
 eLink Physician-Brief Progress Note Patient Name: Frank [name]. DOB: 03-10-1966 MRN: [id number]   Date of Service  07/17/2023  HPI/Events of Note  Patient is mechanically ventilated and bedside staff is asking for an Ativan  infusion.  Unclear etiology of encephalopathy.  Ativan  in place for seizures only.  Also noted that patient's left leg is more swollen than the right.  On camera examination, he appears comfortable and well sedated  eICU Interventions  Add propofol  as needed to maintain RASS goal.  Maintain vasopressors for MAP goal greater than 65  Vascular ultrasound ordered for lower extremity swelling     Intervention Category Minor Interventions: Clinical assessment - ordering diagnostic tests  Ocie Tino 07/17/2023, 9:52 PM

## 2023-07-18 ENCOUNTER — Inpatient Hospital Stay (HOSPITAL_COMMUNITY)

## 2023-07-18 DIAGNOSIS — G9340 Encephalopathy, unspecified: Secondary | ICD-10-CM | POA: Diagnosis not present

## 2023-07-18 DIAGNOSIS — E871 Hypo-osmolality and hyponatremia: Secondary | ICD-10-CM

## 2023-07-18 DIAGNOSIS — M7989 Other specified soft tissue disorders: Secondary | ICD-10-CM

## 2023-07-18 DIAGNOSIS — C9000 Multiple myeloma not having achieved remission: Secondary | ICD-10-CM | POA: Diagnosis not present

## 2023-07-18 DIAGNOSIS — B4489 Other forms of aspergillosis: Secondary | ICD-10-CM

## 2023-07-18 DIAGNOSIS — J9601 Acute respiratory failure with hypoxia: Secondary | ICD-10-CM | POA: Diagnosis not present

## 2023-07-18 DIAGNOSIS — R739 Hyperglycemia, unspecified: Secondary | ICD-10-CM

## 2023-07-18 DIAGNOSIS — E440 Moderate protein-calorie malnutrition: Secondary | ICD-10-CM

## 2023-07-18 LAB — CBC WITH DIFFERENTIAL/PLATELET
Abs Immature Granulocytes: 0 10*3/uL (ref 0.00–0.07)
Basophils Absolute: 0 10*3/uL (ref 0.0–0.1)
Basophils Relative: 0 %
Eosinophils Absolute: 0 10*3/uL (ref 0.0–0.5)
Eosinophils Relative: 6 %
HCT: 24.9 % — ABNORMAL LOW (ref 39.0–52.0)
Hemoglobin: 7.7 g/dL — ABNORMAL LOW (ref 13.0–17.0)
Immature Granulocytes: 0 %
Lymphocytes Relative: 66 %
Lymphs Abs: 0.3 10*3/uL — ABNORMAL LOW (ref 0.7–4.0)
MCH: 30.4 pg (ref 26.0–34.0)
MCHC: 30.9 g/dL (ref 30.0–36.0)
MCV: 98.4 fL (ref 80.0–100.0)
Monocytes Absolute: 0.1 10*3/uL (ref 0.1–1.0)
Monocytes Relative: 16 %
Neutro Abs: 0.1 10*3/uL — CL (ref 1.7–7.7)
Neutrophils Relative %: 12 %
Platelets: 83 10*3/uL — ABNORMAL LOW (ref 150–400)
RBC: 2.53 MIL/uL — ABNORMAL LOW (ref 4.22–5.81)
RDW: 19 % — ABNORMAL HIGH (ref 11.5–15.5)
Smear Review: NORMAL
WBC: 0.5 10*3/uL — CL (ref 4.0–10.5)
nRBC: 132 % — ABNORMAL HIGH (ref 0.0–0.2)

## 2023-07-18 LAB — GLUCOSE, CAPILLARY
Glucose-Capillary: 163 mg/dL — ABNORMAL HIGH (ref 70–99)
Glucose-Capillary: 163 mg/dL — ABNORMAL HIGH (ref 70–99)
Glucose-Capillary: 177 mg/dL — ABNORMAL HIGH (ref 70–99)
Glucose-Capillary: 191 mg/dL — ABNORMAL HIGH (ref 70–99)
Glucose-Capillary: 219 mg/dL — ABNORMAL HIGH (ref 70–99)
Glucose-Capillary: 243 mg/dL — ABNORMAL HIGH (ref 70–99)

## 2023-07-18 LAB — COMPREHENSIVE METABOLIC PANEL WITH GFR
ALT: 40 U/L (ref 0–44)
AST: 19 U/L (ref 15–41)
Albumin: 2 g/dL — ABNORMAL LOW (ref 3.5–5.0)
Alkaline Phosphatase: 35 U/L — ABNORMAL LOW (ref 38–126)
Anion gap: 4 — ABNORMAL LOW (ref 5–15)
BUN: 46 mg/dL — ABNORMAL HIGH (ref 6–20)
CO2: 26 mmol/L (ref 22–32)
Calcium: 7.2 mg/dL — ABNORMAL LOW (ref 8.9–10.3)
Chloride: 101 mmol/L (ref 98–111)
Creatinine, Ser: 2 mg/dL — ABNORMAL HIGH (ref 0.61–1.24)
GFR, Estimated: 38 mL/min — ABNORMAL LOW (ref >60)
Glucose, Bld: 167 mg/dL — ABNORMAL HIGH (ref 70–99)
Potassium: 4.3 mmol/L (ref 3.5–5.1)
Sodium: 131 mmol/L — ABNORMAL LOW (ref 135–145)
Total Bilirubin: 0.7 mg/dL (ref 0.0–1.2)
Total Protein: 9.4 g/dL — ABNORMAL HIGH (ref 6.5–8.1)

## 2023-07-18 LAB — RENAL FUNCTION PANEL
Albumin: 2 g/dL — ABNORMAL LOW (ref 3.5–5.0)
Albumin: 1.9 g/dL — ABNORMAL LOW (ref 3.5–5.0)
Anion gap: 5 (ref 5–15)
Anion gap: 7 (ref 5–15)
BUN: 47 mg/dL — ABNORMAL HIGH (ref 6–20)
BUN: 52 mg/dL — ABNORMAL HIGH (ref 6–20)
CO2: 25 mmol/L (ref 22–32)
CO2: 24 mmol/L (ref 22–32)
Calcium: 7 mg/dL — ABNORMAL LOW (ref 8.9–10.3)
Calcium: 6.9 mg/dL — ABNORMAL LOW (ref 8.9–10.3)
Chloride: 99 mmol/L (ref 98–111)
Chloride: 98 mmol/L (ref 98–111)
Creatinine, Ser: 1.94 mg/dL — ABNORMAL HIGH (ref 0.61–1.24)
Creatinine, Ser: 2.28 mg/dL — ABNORMAL HIGH (ref 0.61–1.24)
GFR, Estimated: 39 mL/min — ABNORMAL LOW (ref >60)
GFR, Estimated: 32 mL/min — ABNORMAL LOW (ref >60)
Glucose, Bld: 166 mg/dL — ABNORMAL HIGH (ref 70–99)
Glucose, Bld: 243 mg/dL — ABNORMAL HIGH (ref 70–99)
Phosphorus: 2 mg/dL — ABNORMAL LOW (ref 2.5–4.6)
Phosphorus: 3.9 mg/dL (ref 2.5–4.6)
Potassium: 4.3 mmol/L (ref 3.5–5.1)
Potassium: 4.3 mmol/L (ref 3.5–5.1)
Sodium: 129 mmol/L — ABNORMAL LOW (ref 135–145)
Sodium: 129 mmol/L — ABNORMAL LOW (ref 135–145)

## 2023-07-18 LAB — CSF CELL COUNT WITH DIFFERENTIAL
RBC Count, CSF: 1 /mm3 — ABNORMAL HIGH
Tube #: 4
WBC, CSF: 1 /mm3 (ref 0–5)

## 2023-07-18 LAB — PHOSPHORUS: Phosphorus: 2 mg/dL — ABNORMAL LOW (ref 2.5–4.6)

## 2023-07-18 LAB — MAGNESIUM: Magnesium: 2.7 mg/dL — ABNORMAL HIGH (ref 1.7–2.4)

## 2023-07-18 LAB — CRYPTOCOCCAL ANTIGEN, CSF: Crypto Ag: NEGATIVE

## 2023-07-18 LAB — TRIGLYCERIDES: Triglycerides: 280 mg/dL — ABNORMAL HIGH (ref <150)

## 2023-07-18 LAB — LACTATE DEHYDROGENASE: LDH: 271 U/L — ABNORMAL HIGH (ref 98–192)

## 2023-07-18 LAB — PROTEIN AND GLUCOSE, CSF
Glucose, CSF: 106 mg/dL — ABNORMAL HIGH (ref 40–70)
Total  Protein, CSF: 34 mg/dL (ref 15–45)

## 2023-07-18 LAB — APTT: aPTT: 34 s (ref 24–36)

## 2023-07-18 MED ORDER — FENTANYL CITRATE (PF) 100 MCG/2ML IJ SOLN
INTRAMUSCULAR | Status: AC
  Administered 2023-07-18: 100 ug via INTRAVENOUS
  Filled 2023-07-18: qty 2

## 2023-07-18 MED ORDER — ROCURONIUM BROMIDE 10 MG/ML (PF) SYRINGE
PREFILLED_SYRINGE | INTRAVENOUS | Status: AC
  Administered 2023-07-18: 100 mg via INTRAVENOUS
  Filled 2023-07-18: qty 10

## 2023-07-18 MED ORDER — FENTANYL CITRATE (PF) 100 MCG/2ML IJ SOLN: 100.0000 ug | Freq: Once | INTRAMUSCULAR | Status: AC

## 2023-07-18 MED ORDER — ROCURONIUM BROMIDE 10 MG/ML (PF) SYRINGE: 100.0000 mg | PREFILLED_SYRINGE | Freq: Once | INTRAVENOUS | Status: AC

## 2023-07-18 MED ORDER — FENTANYL CITRATE PF 50 MCG/ML IJ SOSY
PREFILLED_SYRINGE | INTRAMUSCULAR | Status: AC
  Filled 2023-07-18: qty 2

## 2023-07-18 MED ORDER — GABAPENTIN 100 MG PO CAPS: 100.0000 mg | ORAL_CAPSULE | Freq: Two times a day (BID) | ORAL | Status: DC

## 2023-07-18 MED ORDER — ROCURONIUM BROMIDE 10 MG/ML (PF) SYRINGE
100.0000 mg | PREFILLED_SYRINGE | Freq: Once | INTRAVENOUS | Status: AC | PRN
  Administered 2023-07-18: 100 mg via INTRAVENOUS
  Filled 2023-07-18: qty 10

## 2023-07-18 MED ORDER — SODIUM PHOSPHATES 45 MMOLE/15ML IV SOLN
30.0000 mmol | Freq: Once | INTRAVENOUS | Status: AC
  Administered 2023-07-18: 30 mmol via INTRAVENOUS
  Filled 2023-07-18: qty 10

## 2023-07-18 MED ORDER — INSULIN GLARGINE-YFGN 100 UNIT/ML ~~LOC~~ SOLN
20.0000 [IU] | Freq: Two times a day (BID) | SUBCUTANEOUS | Status: DC
  Administered 2023-07-18 – 2023-07-22 (×8): 20 [IU] via SUBCUTANEOUS
  Filled 2023-07-18 (×9): qty 0.2

## 2023-07-18 MED ORDER — GABAPENTIN 250 MG/5ML PO SOLN
100.0000 mg | Freq: Two times a day (BID) | ORAL | Status: DC
  Administered 2023-07-18 – 2023-07-31 (×27): 100 mg
  Filled 2023-07-18 (×28): qty 2

## 2023-07-18 NOTE — Procedures (Signed)
 CLINICAL DATA: Metabolic encephalopathy EXAM:  DIAGNOSTIC LUMBAR PUNCTURE UNDER FLUOROSCOPIC GUIDANCE COMPARISON: I assessed the brain imaging of 07/17/2023 and the CT abdomen of 07/08/2023 in preparation for this exam. FLUOROSCOPY: Radiation Exposure Index (as provided by the fluoroscopic device): 3.4 mGy Kerma PROCEDURE: The risks (including hemorrhage, infection, and nerve damage, among others), benefits, and alternatives to fluoroscopically guided lumbar puncture were discussed with the patient's wife by telephone, as the patient is unable to consent and has altered mental status.  The patient's wife understood and elected for the patient to undergo the procedure.   Standard time-out was employed.  Following sterile skin prep and local anesthetic administration consisting of 1 percent lidocaine , a 22 gauge spinal needle was advanced without difficulty into the thecal sac at the L2-3 level under fluoroscopic guidance.  Clear CSF was returned.  Because the patient is intubated, I did not turn him onto his side to obtain a pressure measurement.   A total of 11.5  cc of clear CSF was collected in 4 vials.  The needle was subsequently removed and the skin cleansed and bandaged.  No immediate complications were observed.    IMPRESSION: Technically successful fluoroscopically guided lumbar puncture at the L2-3 level.

## 2023-07-18 NOTE — Progress Notes (Signed)
 CRRT stopped at 1030 in preparation for MRI and lumbar puncture. 217 mLs of blood returned to patient from machine. Dr Zana Hesselbach made aware of pause in therapy. Per Dr Zana Hesselbach, patient may remain off CRRT until tomorrow morning.

## 2023-07-18 NOTE — Progress Notes (Signed)
 Ordered AFB is not for Respiratory Therapist to collect per CCM, MD (for Neuro LP).

## 2023-07-18 NOTE — Progress Notes (Signed)
 Assisted with transporting PT from IR to [facility] ICU 1238 while on ventilator (100% fi02)- uneventful.

## 2023-07-18 NOTE — Progress Notes (Addendum)
 NAME:  Frank Sperle., MRN:  [id number], DOB:  1965-08-21, LOS: 15 ADMISSION DATE:  07/03/2023, CONSULTATION DATE:  07/06/23 REFERRING MD:  Dr Maury Space, CHIEF COMPLAINT:  Encephalopathy   History of Present Illness:  PCCM asked to see patient for encephalopathy   Transferred from [facility] ([facility]) with 1 week of generalized weakness, altered mentation, decreased appetite Recently being worked up for multiple myeloma.  Transferred to [facility] for oncology evaluation Nephrology consulted for AKI   Background history of obesity, asthma, obstructive sleep apnea on CPAP GERD, hypertension, hyperlipidemia Has been feeling weak for the last few days according to spouse Admitted with concern for sepsis, community-acquired pneumonia, antibiotic encephalopathy Workup revealed lytic bony lesions with concern for multiple myeloma Transfused with 3 units packed red blood cells Evaluation so far-bone marrow 07/04/2023  Pertinent  Medical History   Past Medical History:  Diagnosis Date   Anxiety    Arthritis    Asthma    Bipolar disorder (HCC)    Current every day smoker    Depression    Diabetes mellitus, type II (HCC)    Dyspnea    History of kidney stones    History of pneumonia    Hyperlipidemia    Hypertension    Morbid obesity (HCC)    Sedentary lifestyle    Seizures (HCC)    last seizure 12 yrs ago, no current problem   Sleep apnea    uses CPAP nightly   Significant Hospital Events: Including procedures, antibiotic start and stop dates in addition to other pertinent events   4/19-blood cultures, MRSA PCR negative 4/19 chest x-ray with mild pulmonary congestion 4/20 atrial fibrillation with RVR 4/21 nosebleed after IR NG attempt, NG placed by PCCM in afternoon, stable transferred to [facility] 4/22 intubated; some abla requiring prbcs; ct abd/pelvis showing acute groin hematoma tracking from posterior presumed from site of bone marrow biopsy; heparin  held EEG 4/25 >> moderate to severe  encephalopathy without any seizures or epileptiform discharges 4/23 respiratory culture >> MRSE 4/28 remains intubated on CRRT, down to one pressor  4/29 labile BP, NE back up. Responded a bit to fluids. Received Velcade  and Cytoxan  chemotherapy 5/1 remains encephalopathic, check CTH  Interim History / Subjective:  CTH without clear etiology of encephalopathy No change in mental status  Objective   Blood pressure (!) 97/55, pulse 78, temperature 98.5 F (36.9 C), temperature source Axillary, resp. rate 20, height 6' (1.829 m), weight 98.1 kg, SpO2 96%.    Vent Mode: PRVC FiO2 (%):  [30 %-100 %] 30 % Set Rate:  [20 bmp] 20 bmp Vt Set:  [540 mL] 540 mL PEEP:  [5 cmH20] 5 cmH20 Plateau Pressure:  [11 cmH20-21 cmH20] 17 cmH20   Intake/Output Summary (Last 24 hours) at 07/18/2023 0800 Last data filed at 07/18/2023 0700 Gross per 24 hour  Intake 4791.48 ml  Output 2983.1 ml  Net 1808.38 ml   Filed Weights   07/16/23 0500 07/17/23 0500 07/18/23 0420  Weight: 97.8 kg 97.9 kg 98.1 kg   Physical Exam: General:  critically ill M, intubated and sedated  HEENT: MM dry, ETT in place Neuro: Sedated, not responsive CV: RRR, no M/R/G PULM:  mildly diminished air entry bilateral bases without wheezing or rhonchi GI: soft, bsx4 active  Extremities: warm/dry, no erythema or edema  Skin: + skin tenting still, no rashes or lesions  Micro: 4/28 BCx > NGTD 4/23 respiratory cx> staph epi, multi-resistant, rare budding yeast 4/19 Bcx2>NGTD   Assessment & Plan:  Acute hypoxemic respiratory failure:  Suspect aspiration and MSSE pneumonia Continued fevers  Plan: -Continue full vent support. -SBT as able, mental status remains barrier thus far -titrate Vent setting to maintain SpO2 greater than or equal to 90%. -HOB elevated 30 degrees. -Plateau pressures less than 30 cm H20.  -Follow chest x-ray, ABG prn.   -Bronchial hygiene and RT/bronchodilator protocol. -Continue vancomycin , zosyn   changed to Meropenem  by Oncology -CXR intermittently.  Shock - multifactorial, seems hypovolemia at play along with probable MRSE PNA (only culprit as a source thus far). Echo 4/21 reassuring. Neutropenia  -Continue Vanc/Mero -Repeat blood cultures without growth -LP today  Multiple myeloma Suspected tumor lysis - labs improved with CRRT, so less suspicious  -Oncology on board, appreciate the assistance. -Appreciate hematology/oncology assistance.  Received Velcade  and Cytotoxan 4/29,  Velcade  held 5/2   Paroxysmal atrial fibrillation with RVR - converted to NSR/ST 4/22 -Anticoagulation on hold, if has recurrence then will need to resume  -Continue amiodarone  po (amio 200mg  BID x 2 weeks, then 200mg  daily x 2 weeks, then stop) -Appreciate cardiology assistance  AKI:  UOP remains poor  -Nephrology following, appreciate the assistance. -Continue CRRT, run at even. -Follow BMP, electrolytes.   Right groin hematoma:  Related to myeloma, lab checks, prior hydration with hypercalcemia, hematoma tracks from site of bone marrow biopsy down to thigh on CT. Hgb now stable.  -Transfuse for Hgb < 7.   Acute metabolic encephalopathy - Multifactorial, sepsis, hypercalcemia, myeloma ICU delirium - scheduled Klonopin  and Seroquel  enterally to try and help minimize IV infusions (precedex  and fentanyl  currently). Mental status and high doses IV sedation remain barrier to SBT and consideration extubation. -resume Gapapentin and decreased dose along with  PTA Buspar , Paxil  -encephalopathy not improving with improving ammonia and enteral medications, EEG was negative, CTH without clear etiology -curbside consult with neurology, recommend IR consult for LP, check VDRL, cryptococcus, cultures, AFB, TB nucleic acid  along with MRI brain  Hyperammonemia History of seizure disorder - No evidence seizures on EEG 4/25.   - Holding Depakote  given hyperammonemia. -Continue lactulose . - Minimize  sedation. - Following intermittent ammonia, improving     History of obstructive sleep apnea - CPAP at night if improves/once extubated   Hypoalbuminemia -Tube feeding RD   T2DM -Continue SSI, Semglee   Depression/anxiety - Resume PTA Buspar , Paxil    Peripheral neuropathy - Continue to hold gabapentin    Best Practice (right click and "Reselect all SmartList Selections" daily)   Diet/type: tubefeeds DVT prophylaxis SCD Pressure ulcer(s): N/A GI prophylaxis: H2B Lines: Central line Foley:  Yes, and it is still needed Code Status:  full code Last date of multidisciplinary goals of care discussion [family update pending 4/28]  CRITICAL CARE Performed by: [name] Ashely Joshua   Total critical care time: 40 minutes  Critical care time was exclusive of separately billable procedures and treating other patients.  Critical care was necessary to treat or prevent imminent or life-threatening deterioration.  Critical care was time spent personally by me on the following activities: development of treatment plan with patient and/or surrogate as well as nursing, discussions with consultants, evaluation of patient's response to treatment, examination of patient, obtaining history from patient or surrogate, ordering and performing treatments and interventions, ordering and review of laboratory studies, ordering and review of radiographic studies, pulse oximetry and re-evaluation of patient's condition.   [name] Tish Begin, PA-C [facility] See Amion for pager If no response to pager , please call [phone] until  7pm After 7:00 pm call Elink  [phone]

## 2023-07-18 NOTE — Progress Notes (Signed)
 Lower extremity venous duplex completed. Please see CV Procedures for preliminary results.  Estanislao Heimlich, RVT 07/18/23 4:35 PM

## 2023-07-18 NOTE — Plan of Care (Signed)
  Problem: Clinical Measurements: Goal: Respiratory complications will improve Outcome: Progressing   Problem: Nutrition: Goal: Adequate nutrition will be maintained Outcome: Progressing   Problem: Coping: Goal: Level of anxiety will decrease Outcome: Progressing   

## 2023-07-18 NOTE — Progress Notes (Signed)
 [facility] NEPHROLOGY PROGRESS NOTE  Assessment/ Plan: Pt is a 58 y.o. yo male with past medical history significant for HTN, HLD, type II DM, BPH, seizure who was initially presented at [facility] due to AMS, admitted for sepsis due to pneumonia, encephalopathy, sepsis, AKI, hypercalcemia symptomatic anemia and Lytic pulm lesion concern for multiple myeloma.  # AKI on CKD 3a - b/l creat 1.1- 1.5 from 2024, eGFR 53- >60 ml/min. Creat here 5.8 on presentation at OSH. AKI due to possible myeloma kidney +/- hyperCa +/- ACEi (home med). Initially treated with IV fluid and then required Lasix  because of fluid overload. Started CRRT on 4/23. Pt remains anuric, and tolerating CRRT well.  Due for procedures off the floor - will hold CRRT tonight and resume in am tomorrow.   # AMS/acute metabolic encephalopathy: not improving. Going for MRI and LP today.   #Volume - was wet and rx'd w/ IV lasix  then CRRT. Now dry. Keeping positive +60/hr  # Shock- remains on Levo and vasopressin .    # Multiple myeloma: Oncologist started Velcade  and Cytoxan , per oncology team.    # Atrial fib-  on IV amio and IV diltiazem .  #Hypercalcemia -  resolved  now. Per oncology.   #Multifocal PNA - suspected aspiration + MSSE pna. Getting IV meropenem .   #Anemia - prn transfusions.  # Hyperuricemia: s/p rasburicase , +dialysis.  # Hypophosphatemia: due to CRRT, remains low. IV Naphos 30 mmol today. .  Subjective:  I/O yest were +2.2 L Wt's stable at 98kg On levo 7- 12 micrograms/min + vaso gtt at 0.03    Objective Vital signs in last 24 hours: Vitals:   07/18/23 1135 07/18/23 1140 07/18/23 1159 07/18/23 1247  BP: (!) 170/96 (!) 166/96    Pulse:      Resp:      Temp:      TempSrc:      SpO2:   100% 100%  Weight:      Height:        Physical Exam: General: Critically ill looking male, intubated, sedated, dry mouth Heart:RRR, s1s2 nl Lungs: Coarse breath sound  bilateral Abdomen:soft, Non-tender, non-distended Extremities:No edema Neurology: not responsive, on vent   Frank Kyran Whittier  MD  [facility] 07/18/2023, 1:06 PM  Recent Labs  Lab 07/17/23 1638 07/17/23 2235 07/18/23 0407  HGB  --  7.6* 7.7*  ALBUMIN  2.0*  --  2.0*  2.0*  CALCIUM  7.0*  --  7.2*  7.0*  PHOS RESULTS UNAVAILABLE DUE TO INTERFERING SUBSTANCE  --  2.0*  2.0*  CREATININE 2.10*  --  2.00*  1.94*  K 4.8  --  4.3  4.3    Inpatient medications:  amiodarone   200 mg Per NG tube BID   Followed by   [name] ON 07/24/2023] amiodarone   200 mg Per NG tube Daily   busPIRone   15 mg Per Tube QHS   clonazePAM   1 mg Per Tube BID   docusate  100 mg Per Tube BID   epoetin  alfa  40,000 Units Subcutaneous QODAY   famotidine   10.4 mg Per Tube Daily   feeding supplement (PROSource TF20)  60 mL Per Tube TID   filgrastim  (NIVESTYM ) SQ  480 mcg Subcutaneous Daily   folic acid   1 mg Per Tube Daily   gabapentin   100 mg Per Tube Q12H   insulin  aspart  0-20 Units Subcutaneous Q4H   insulin  aspart  6 Units Subcutaneous Q4H   insulin  glargine-yfgn  20 Units Subcutaneous  Daily   lactulose   20 g Per Tube Daily   nicotine   14 mg Transdermal Daily   mouth rinse  15 mL Mouth Rinse Q2H   PARoxetine   40 mg Per Tube Daily   pneumococcal 20-valent conjugate vaccine  0.5 mL Intramuscular Tomorrow-1000   polyethylene glycol  17 g Per Tube Daily   QUEtiapine   50 mg Per Tube BID   sodium chloride  flush  10-40 mL Intracatheter Q12H   thiamine   100 mg Per Tube Daily    sodium chloride  Stopped (07/18/23 1914)   dexmedetomidine  (PRECEDEX ) IV infusion Stopped (07/18/23 0945)   feeding supplement (VITAL 1.5 CAL) 60 mL/hr at 07/18/23 1057   fentaNYL  infusion INTRAVENOUS 200 mcg/hr (07/18/23 1057)   heparin  10,000 units/ 20 mL infusion syringe Stopped (07/18/23 1030)   meropenem  (MERREM ) IV Stopped (07/18/23 0559)   norepinephrine  (LEVOPHED ) Adult infusion 12 mcg/min (07/18/23 1120)   prismasol  BGK 4/2.5 Stopped  (07/18/23 1030)   prismasol  BGK 4/2.5 Stopped (07/18/23 1030)   prismasol  BGK 4/2.5 Stopped (07/18/23 1030)   propofol  (DIPRIVAN ) infusion 45 mcg/kg/min (07/18/23 1057)   vancomycin  Stopped (07/17/23 1422)   vasopressin  0.03 Units/min (07/18/23 1057)   sodium chloride , acetaminophen  (TYLENOL ) oral liquid 160 mg/5 mL **OR** acetaminophen , fentaNYL , heparin , LORazepam , mouth rinse, oxyCODONE , polyethylene glycol, polyvinyl alcohol , sodium chloride  flush

## 2023-07-18 NOTE — Procedures (Addendum)
 Intubation Procedure Note  Frank [name]  [id number]  1966-03-10  Date:07/18/23  Time:3:10 PM   Provider Performing:Mikell Camp R Gabriele Loveland    Procedure: Intubation (31500)  Indication(s) Respiratory Failure  Consent Unable to obtain consent due to emergent nature of procedure.   Anesthesia Fentanyl  and Rocuronium    Time Out Verified patient identification, verified procedure, site/side was marked, verified correct patient position, special equipment/implants available, medications/allergies/relevant history reviewed, required imaging and test results available.   Sterile Technique Usual hand hygeine, masks, and gloves were used   Procedure Description Patient positioned in bed supine.  Sedation given as noted above.  Patient was intubated with endotracheal tube using Glidescope.  View was Grade 1 full glottis .  Number of attempts was 1.  Colorimetric CO2 detector was consistent with tracheal placement.   Complications/Tolerance None; patient tolerated the procedure well. Chest X-ray is ordered to verify placement.   EBL Minimal   Specimen(s) None  [name] [name], PA-C

## 2023-07-18 NOTE — Plan of Care (Signed)
  Problem: Education: Goal: Knowledge of General Education information will improve Description: Including pain rating scale, medication(s)/side effects and non-pharmacologic comfort measures Outcome: Progressing   Problem: Health Behavior/Discharge Planning: Goal: Ability to manage health-related needs will improve Outcome: Progressing   Problem: Clinical Measurements: Goal: Ability to maintain clinical measurements within normal limits will improve Outcome: Progressing Goal: Will remain free from infection Outcome: Progressing Goal: Diagnostic test results will improve Outcome: Progressing Goal: Respiratory complications will improve Outcome: Progressing Goal: Cardiovascular complication will be avoided Outcome: Progressing   Problem: Activity: Goal: Risk for activity intolerance will decrease Outcome: Progressing   Problem: Nutrition: Goal: Adequate nutrition will be maintained Outcome: Progressing   Problem: Coping: Goal: Level of anxiety will decrease Outcome: Progressing   Problem: Elimination: Goal: Will not experience complications related to bowel motility Outcome: Progressing Goal: Will not experience complications related to urinary retention Outcome: Progressing   Problem: Pain Managment: Goal: General experience of comfort will improve and/or be controlled Outcome: Progressing   Problem: Safety: Goal: Ability to remain free from injury will improve Outcome: Progressing   Problem: Skin Integrity: Goal: Risk for impaired skin integrity will decrease Outcome: Progressing   Problem: Education: Goal: Ability to describe self-care measures that may prevent or decrease complications (Diabetes Survival Skills Education) will improve Outcome: Progressing Goal: Individualized Educational Video(s) Outcome: Progressing   Problem: Coping: Goal: Ability to adjust to condition or change in health will improve Outcome: Progressing   Problem: Fluid  Volume: Goal: Ability to maintain a balanced intake and output will improve Outcome: Progressing   Problem: Health Behavior/Discharge Planning: Goal: Ability to identify and utilize available resources and services will improve Outcome: Progressing Goal: Ability to manage health-related needs will improve Outcome: Progressing   Problem: Metabolic: Goal: Ability to maintain appropriate glucose levels will improve Outcome: Progressing   Problem: Nutritional: Goal: Maintenance of adequate nutrition will improve Outcome: Progressing Goal: Progress toward achieving an optimal weight will improve Outcome: Progressing   Problem: Skin Integrity: Goal: Risk for impaired skin integrity will decrease Outcome: Progressing   Problem: Tissue Perfusion: Goal: Adequacy of tissue perfusion will improve Outcome: Progressing   Problem: Activity: Goal: Ability to tolerate increased activity will improve Outcome: Progressing   Problem: Respiratory: Goal: Ability to maintain a clear airway and adequate ventilation will improve Outcome: Progressing   Problem: Role Relationship: Goal: Method of communication will improve Outcome: Progressing

## 2023-07-18 NOTE — Progress Notes (Signed)
 eLink Physician-Brief Progress Note Patient Name: Frank [name]. DOB: August 20, 1965 MRN: [id number]   Date of Service  07/18/2023  HPI/Events of Note  Vented Pt w/ high prob of self extubation  eICU Interventions  Renew restraints for patient safety     Intervention Category Minor Interventions: Agitation / anxiety - evaluation and management  Morrill Bomkamp 07/18/2023, 10:52 PM

## 2023-07-18 NOTE — Progress Notes (Signed)
 Assisted with transporting PT from [facility] ICU to [facility] MRI while on ventilator (100% fi02)- uneventful.

## 2023-07-18 NOTE — Progress Notes (Signed)
 Pt lost volumes on the vent and ETT partially out, pt was re-intubated without difficulty.  [name] Brittain Smithey, PA-C

## 2023-07-18 NOTE — Progress Notes (Signed)
 Assisted with transporting PT from [facility] MRI to [facility] IR while on vent (100% fi02)- uneventful.

## 2023-07-19 DIAGNOSIS — J9601 Acute respiratory failure with hypoxia: Secondary | ICD-10-CM | POA: Diagnosis not present

## 2023-07-19 DIAGNOSIS — C9000 Multiple myeloma not having achieved remission: Secondary | ICD-10-CM | POA: Diagnosis not present

## 2023-07-19 DIAGNOSIS — R579 Shock, unspecified: Secondary | ICD-10-CM | POA: Diagnosis not present

## 2023-07-19 DIAGNOSIS — D709 Neutropenia, unspecified: Secondary | ICD-10-CM

## 2023-07-19 DIAGNOSIS — G9341 Metabolic encephalopathy: Secondary | ICD-10-CM

## 2023-07-19 DIAGNOSIS — Z9911 Dependence on respirator [ventilator] status: Secondary | ICD-10-CM

## 2023-07-19 DIAGNOSIS — R531 Weakness: Secondary | ICD-10-CM | POA: Diagnosis not present

## 2023-07-19 LAB — CBC WITH DIFFERENTIAL/PLATELET
Abs Immature Granulocytes: 0.01 10*3/uL (ref 0.00–0.07)
Basophils Absolute: 0 10*3/uL (ref 0.0–0.1)
Basophils Relative: 0 %
Eosinophils Absolute: 0 10*3/uL (ref 0.0–0.5)
Eosinophils Relative: 7 %
HCT: 24.6 % — ABNORMAL LOW (ref 39.0–52.0)
Hemoglobin: 7.4 g/dL — ABNORMAL LOW (ref 13.0–17.0)
Immature Granulocytes: 2 %
Lymphocytes Relative: 54 %
Lymphs Abs: 0.3 10*3/uL — ABNORMAL LOW (ref 0.7–4.0)
MCH: 30.7 pg (ref 26.0–34.0)
MCHC: 30.1 g/dL (ref 30.0–36.0)
MCV: 102.1 fL — ABNORMAL HIGH (ref 80.0–100.0)
Monocytes Absolute: 0.1 10*3/uL (ref 0.1–1.0)
Monocytes Relative: 12 %
Neutro Abs: 0.2 10*3/uL — CL (ref 1.7–7.7)
Neutrophils Relative %: 25 %
Platelets: 100 10*3/uL — ABNORMAL LOW (ref 150–400)
RBC: 2.41 MIL/uL — ABNORMAL LOW (ref 4.22–5.81)
RDW: 19.6 % — ABNORMAL HIGH (ref 11.5–15.5)
WBC: 0.6 10*3/uL — CL (ref 4.0–10.5)
nRBC: 116.4 % — ABNORMAL HIGH (ref 0.0–0.2)

## 2023-07-19 LAB — RENAL FUNCTION PANEL
Albumin: 1.8 g/dL — ABNORMAL LOW (ref 3.5–5.0)
Albumin: 1.9 g/dL — ABNORMAL LOW (ref 3.5–5.0)
Anion gap: 9 (ref 5–15)
Anion gap: 9 (ref 5–15)
BUN: 71 mg/dL — ABNORMAL HIGH (ref 6–20)
BUN: 65 mg/dL — ABNORMAL HIGH (ref 6–20)
CO2: 23 mmol/L (ref 22–32)
CO2: 22 mmol/L (ref 22–32)
Calcium: 7.1 mg/dL — ABNORMAL LOW (ref 8.9–10.3)
Calcium: 7.1 mg/dL — ABNORMAL LOW (ref 8.9–10.3)
Chloride: 100 mmol/L (ref 98–111)
Chloride: 98 mmol/L (ref 98–111)
Creatinine, Ser: 3.21 mg/dL — ABNORMAL HIGH (ref 0.61–1.24)
Creatinine, Ser: 2.7 mg/dL — ABNORMAL HIGH (ref 0.61–1.24)
GFR, Estimated: 21 mL/min — ABNORMAL LOW (ref >60)
GFR, Estimated: 26 mL/min — ABNORMAL LOW (ref >60)
Glucose, Bld: 223 mg/dL — ABNORMAL HIGH (ref 70–99)
Glucose, Bld: 209 mg/dL — ABNORMAL HIGH (ref 70–99)
Phosphorus: 6.9 mg/dL — ABNORMAL HIGH (ref 2.5–4.6)
Phosphorus: 4.2 mg/dL (ref 2.5–4.6)
Potassium: 4.9 mmol/L (ref 3.5–5.1)
Potassium: 4.6 mmol/L (ref 3.5–5.1)
Sodium: 132 mmol/L — ABNORMAL LOW (ref 135–145)
Sodium: 129 mmol/L — ABNORMAL LOW (ref 135–145)

## 2023-07-19 LAB — MENINGITIS/ENCEPHALITIS PANEL (CSF)

## 2023-07-19 LAB — COMPREHENSIVE METABOLIC PANEL WITH GFR
ALT: 34 U/L (ref 0–44)
AST: 14 U/L — ABNORMAL LOW (ref 15–41)
Albumin: 1.9 g/dL — ABNORMAL LOW (ref 3.5–5.0)
Alkaline Phosphatase: 37 U/L — ABNORMAL LOW (ref 38–126)
Anion gap: 11 (ref 5–15)
BUN: 71 mg/dL — ABNORMAL HIGH (ref 6–20)
CO2: 22 mmol/L (ref 22–32)
Calcium: 7.1 mg/dL — ABNORMAL LOW (ref 8.9–10.3)
Chloride: 97 mmol/L — ABNORMAL LOW (ref 98–111)
Creatinine, Ser: 3.18 mg/dL — ABNORMAL HIGH (ref 0.61–1.24)
GFR, Estimated: 22 mL/min — ABNORMAL LOW (ref >60)
Glucose, Bld: 216 mg/dL — ABNORMAL HIGH (ref 70–99)
Potassium: 4.7 mmol/L (ref 3.5–5.1)
Sodium: 130 mmol/L — ABNORMAL LOW (ref 135–145)
Total Bilirubin: 0.7 mg/dL (ref 0.0–1.2)
Total Protein: 8.8 g/dL — ABNORMAL HIGH (ref 6.5–8.1)

## 2023-07-19 LAB — MAGNESIUM: Magnesium: 3.1 mg/dL — ABNORMAL HIGH (ref 1.7–2.4)

## 2023-07-19 LAB — CULTURE, BLOOD (ROUTINE X 2)
Culture: NO GROWTH
Culture: NO GROWTH
Special Requests: ADEQUATE

## 2023-07-19 LAB — CULTURE, RESPIRATORY W GRAM STAIN

## 2023-07-19 LAB — PHOSPHORUS: Phosphorus: 6.6 mg/dL — ABNORMAL HIGH (ref 2.5–4.6)

## 2023-07-19 LAB — LACTATE DEHYDROGENASE: LDH: 214 U/L — ABNORMAL HIGH (ref 98–192)

## 2023-07-19 LAB — GLUCOSE, CAPILLARY
Glucose-Capillary: 123 mg/dL — ABNORMAL HIGH (ref 70–99)
Glucose-Capillary: 143 mg/dL — ABNORMAL HIGH (ref 70–99)
Glucose-Capillary: 156 mg/dL — ABNORMAL HIGH (ref 70–99)
Glucose-Capillary: 157 mg/dL — ABNORMAL HIGH (ref 70–99)
Glucose-Capillary: 194 mg/dL — ABNORMAL HIGH (ref 70–99)
Glucose-Capillary: 227 mg/dL — ABNORMAL HIGH (ref 70–99)

## 2023-07-19 LAB — APTT: aPTT: 26 s (ref 24–36)

## 2023-07-19 MED ORDER — HEPARIN SODIUM (PORCINE) 5000 UNIT/ML IJ SOLN
5000.0000 [IU] | Freq: Three times a day (TID) | INTRAMUSCULAR | Status: AC
  Administered 2023-07-19 – 2023-07-23 (×14): 5000 [IU] via SUBCUTANEOUS
  Filled 2023-07-19 (×14): qty 1

## 2023-07-19 MED ORDER — NICOTINE 7 MG/24HR TD PT24
7.0000 mg | MEDICATED_PATCH | Freq: Every day | TRANSDERMAL | Status: DC
  Administered 2023-07-20 – 2023-09-17 (×56): 7 mg via TRANSDERMAL
  Filled 2023-07-19 (×61): qty 1

## 2023-07-19 MED ORDER — SODIUM CHLORIDE 0.9 % IV SOLN
1.0000 g | Freq: Three times a day (TID) | INTRAVENOUS | Status: DC
  Administered 2023-07-19 – 2023-07-22 (×9): 1 g via INTRAVENOUS
  Filled 2023-07-19 (×11): qty 20

## 2023-07-19 MED ORDER — INSULIN ASPART 100 UNIT/ML IJ SOLN
8.0000 [IU] | INTRAMUSCULAR | Status: DC
  Administered 2023-07-19 – 2023-07-29 (×46): 8 [IU] via SUBCUTANEOUS

## 2023-07-19 NOTE — Plan of Care (Signed)
  Problem: Clinical Measurements: Goal: Ability to maintain clinical measurements within normal limits will improve Outcome: Progressing Goal: Respiratory complications will improve Outcome: Progressing Goal: Cardiovascular complication will be avoided Outcome: Progressing   Problem: Clinical Measurements: Goal: Will remain free from infection Outcome: Not Progressing Goal: Diagnostic test results will improve Outcome: Not Progressing

## 2023-07-19 NOTE — Progress Notes (Signed)
 NAME:  Frank Armagost., MRN:  [id number], DOB:  03-Sep-1965, LOS: 16 ADMISSION DATE:  07/03/2023, CONSULTATION DATE:  07/06/23 REFERRING MD:  Dr Maury Space, CHIEF COMPLAINT:  Encephalopathy   History of Present Illness:  PCCM asked to see patient for encephalopathy   Transferred from [facility] with 1 week of generalized weakness, altered mentation, decreased appetite Recently being worked up for multiple myeloma.  Transferred to [facility] for oncology evaluation Nephrology consulted for AKI   Background history of obesity, asthma, obstructive sleep apnea on CPAP GERD, hypertension, hyperlipidemia Has been feeling weak for the last few days according to spouse Admitted with concern for sepsis, community-acquired pneumonia, antibiotic encephalopathy Workup revealed lytic bony lesions with concern for multiple myeloma Transfused with 3 units packed red blood cells Evaluation so far-bone marrow 07/04/2023  Pertinent  Medical History   Past Medical History:  Diagnosis Date   Anxiety    Arthritis    Asthma    Bipolar disorder (HCC)    Current every day smoker    Depression    Diabetes mellitus, type II (HCC)    Dyspnea    History of kidney stones    History of pneumonia    Hyperlipidemia    Hypertension    Morbid obesity (HCC)    Sedentary lifestyle    Seizures (HCC)    last seizure 12 yrs ago, no current problem   Sleep apnea    uses CPAP nightly   Significant Hospital Events: Including procedures, antibiotic start and stop dates in addition to other pertinent events   4/19-blood cultures, MRSA PCR negative 4/19 chest x-ray with mild pulmonary congestion 4/20 atrial fibrillation with RVR 4/21 nosebleed after IR NG attempt, NG placed by PCCM in afternoon, stable transferred to [facility] 4/22 intubated; some abla requiring prbcs; ct abd/pelvis showing acute groin hematoma tracking from posterior presumed from site of bone marrow biopsy; heparin  held EEG 4/25 >> moderate to severe  encephalopathy without any seizures or epileptiform discharges 4/23 respiratory culture >> MRSE 4/28 remains intubated on CRRT, down to one pressor  4/29 labile BP, NE back up. Responded a bit to fluids. Received Velcade  and Cytoxan  chemotherapy 5/1 remains encephalopathic, check CTH  Interim History / Subjective:  Overnight back on dialysis.  Tmax 99.8.  Objective   Blood pressure (!) 103/59, pulse 92, temperature 99.1 F (37.3 C), temperature source Axillary, resp. rate 20, height 6' (1.829 m), weight 99 kg, SpO2 99%.    Vent Mode: PRVC FiO2 (%):  [30 %-100 %] 30 % Set Rate:  [20 bmp] 20 bmp Vt Set:  [540 mL] 540 mL PEEP:  [5 cmH20] 5 cmH20 Plateau Pressure:  [16 cmH20-21 cmH20] 17 cmH20   Intake/Output Summary (Last 24 hours) at 07/19/2023 1048 Last data filed at 07/19/2023 1000 Gross per 24 hour  Intake 3550.89 ml  Output 500.3 ml  Net 3050.59 ml   Filed Weights   07/17/23 0500 07/18/23 0420 07/19/23 0500  Weight: 97.9 kg 98.1 kg 99 kg   Physical Exam: General: Critically ill-appearing man lying in bed no acute distress, intubated and sedated HEENT: [place]/AT, eyes anicteric Neuro: RASS -5 CV: S1-S2, regular rate and rhythm PULM: Breathing actually on mechanical ventilation, minimal endotracheal secretions.  Decreased basilar breath sounds, no rhonchi. GI:, Soft, nontender Extremities: ++ Edema; left upper extremity has disproportionate tissue edema compared to the left-per nurses this has been chronic.  Hematoma right groin.  Left femoral CVC. Skin: No diffuse rashes  Micro: 4/28 BCx > no  growth, final 4/30 respiratory culture-few PMN, rare budding yeast, abundant GPC  Sodium 130 BUN 71, creatinine 3.18 on CRRT WBC 0.6 H/H 7.4/24.6 Platelets 100 glucose in the 200s  CSF studies 1 RBC, 1 WBC, glucose 106 CSF culture-2 organisms acute, rare WBC  Assessment & Plan:   Acute hypoxemic respiratory failure requiring mechanical ventilation due to aspiration and MSSE  pneumonia -LTVV - VAP prevention protocol - PAD protocol for sedation - Daily SAT and SBT as appropriate.  Mental status has been the main barrier so far to extubation. - Volume management with CRRT. -Follow repeat trach aspirate culture.  Shock, presumed septic- multifactorial, initially was felt to be hypovolemic at play along with probable MRSE PNA (only culprit as a source thus far). Echo 4/21 reassuring. Neutropenia due to chemotherapy - Continue vancomycin  and meropenem  - Follow cultures - Multiple CSF studies sent 5/2 -Repeat blood cultures without growth -LP today  Multiple myeloma Possible tumor lysis syndrome - labs improved with CRRT -appreciate Oncology's management; received Velcade  and Cytotoxan 4/29,  Velcade  held 5/2.   Paroxysmal atrial fibrillation with RVR - converted to NSR/ST 4/22 -holding AC due to bleeding earlier this admission, thrombocytopenia, which has improved today -con't amiodarone  -Appreciate cardiology assistance -if platelets remain ok can restart AC if Afib recurds  AKI on CRRT Hypervolemic hyponatremia -Appreciate nephrology's management - CRRT for metabolic clearance and volume management - Renally dose meds, avoid nephrotoxins   Right groin hematoma, stable- tracks from site of bone marrow biopsy down to thigh on CT. Hgb now stable.  -Transfuse for hemoglobin less than 7 or hemodynamically significant bleeding   Acute metabolic encephalopathy - Multifactorial, sepsis, hypercalcemia, myeloma ICU delirium; brain MRI- reassuring, and enteritis unlikely based on initial studies -Ental status has been a significant barrier to extubation.  Continue Klonopin  and Seroquel .  PAD protocol. - Resuming PTA gabapentin  to reduce withdrawal symptoms; PTA Paxil  -Follow neurology's recommendations as a result PEEP and MRI.  Waiting on multiple CSF studies to return.  Hyperammonemia, resolved History of seizure disorder - No evidence seizures on EEG 4/25.    - Holding Depakote  given hyperammonemia> may not be able to restart this -Continue lactulose  daily - PAD protocol for sedation -Monitor for seizures, especially on meropenem .  If this happens we will have to discontinue meropenem .    History of obstructive sleep apnea -Will need CPAP at night after extubation   Hypoalbuminemia -Tube feeding   T2DM with uncontrolled hyperglycemia -glargine 20 units BID -TF coverage aspart 8 units q4h -SSI PRN -goal BG 140-180  Depression/anxiety -PTA BuSpar  and Paxil    Peripheral neuropathy - Gabapentin   History of tobacco abuse - Decrease nicotine  patch to 7 mcg patch  Best Practice (right click and "Reselect all SmartList Selections" daily)   Diet/type: tubefeeds DVT prophylaxis prophylactic heparin   Pressure ulcer(s): N/A GI prophylaxis: H2B Lines: Central line and Dialysis Catheter Foley:  removal ordered  Code Status:  full code Last date of multidisciplinary goals of care discussion [family update pending 4/28]  This patient is critically ill with multiple organ system failure which requires frequent high complexity decision making, assessment, support, evaluation, and titration of therapies. This was completed through the application of advanced monitoring technologies and extensive interpretation of multiple databases. During this encounter critical care time was devoted to patient care services described in this note for 45 minutes.  Joesph Mussel, DO 07/19/23 11:07 AM [facility]  For contact information, see Amion. If no response to pager, please  call PCCM consult pager. After hours, 7PM- 7AM, please call Elink.

## 2023-07-19 NOTE — Progress Notes (Signed)
 Pharmacy Antibiotic Note  Frank [name]. is a 58 y.o. male admitted on 07/03/2023 with AMS, hypercalcemia of malignancy, new MM diagnosis as a transfer from OSH.  Pharmacy has been consulted for Vanco dosing for pneumonia.  LP on 5/2.  Continues on CRRT (paused on 5/2 at 10:30am for MRI, restarted 5/3 at 08:30 am) Fever: Afebrile (Tm 99.8) WBC low, neutropenia improving with ANC 0.2 (filgrastim  started 4/30>>)   Plan: Meropenem  to 1g IV q8h for CRRT dosing.  Vancomycin  1000 mg IV q24h for CRRT dosing.  Measure Vanc levels as needed.  Goal AUC = 400 - 550.  Follow up renal function, culture results, and clinical course.   Height: 6' (182.9 cm) Weight: 99 kg (218 lb 4.1 oz) IBW/kg (Calculated) : 77.6  Temp (24hrs), Avg:99.2 F (37.3 C), Min:98.6 F (37 C), Max:99.8 F (37.7 C)  Recent Labs  Lab 07/16/23 0417 07/16/23 1611 07/17/23 0441 07/17/23 1410 07/17/23 1638 07/18/23 0407 07/18/23 1602 07/19/23 0405  WBC 1.8*  --  0.7* 0.5*  --  0.5*  --  0.6*  CREATININE 2.14*  2.16*   < > 1.95*  1.96*  --  2.10* 2.00*  1.94* 2.28* 3.18*   < > = values in this interval not displayed.    Estimated Creatinine Clearance: 30.5 mL/min (A) (by C-G formula based on SCr of 3.18 mg/dL (H)).    Allergies  Allergen Reactions   Morphine And Codeine Anaphylaxis   Shellfish Allergy Anaphylaxis   Betadine  [Povidone Iodine ] Itching   Bupropion Other (See Comments)    Shaking of the body, hallucinations    Chlorhexidine  Hives    Patient reports never had issues CHG with mouth rinse.   Influenza Vaccines Hives   Metoprolol  Rash   Antimicrobials this admission:  4/17 Azith >> 4/20  4/17 CTX >> 4/22 4/18 Fluconazole  > 4/23 4/22 Zosyn  >> 5/1 4/28: Vanco>> 5/1 Meropenem  >>   Microbiology results:  4/19: MRSA PCR: neg 4/19 UCx: ngF 4/19 BCx: ngF 4/23 TA: few Staph epi: MRSE 4/28 BCx: ngF 4/30 TA: rare aspergillus fumigatus 5/2 CSF: ngtd   Kendall Pauls PharmD, BCPS [facility] main  pharmacy [phone] 07/19/2023 11:31 AM

## 2023-07-19 NOTE — Progress Notes (Signed)
 Frank [name]  Assessment/ Plan: Pt is a 58 y.o. yo male with past medical history significant for HTN, HLD, type II DM, BPH, seizure who was initially presented at [facility] due to AMS, admitted for sepsis due to pneumonia, encephalopathy, sepsis, AKI, hypercalcemia symptomatic anemia and Lytic pulm lesion concern for multiple myeloma.  # AKI on CKD 3a - b/l creat 1.1- 1.5 from 2024, eGFR 53- >60 ml/min. Creat here 5.8 on presentation at OSH. AKI due to possible myeloma kidney +/- hyperCa +/- ACEi (home med). Initially treated with IV fluid and then required Lasix  because of fluid overload. Started CRRT on 4/23. Pt remains anuric, and tolerating CRRT well.  Due for procedures off the floor - will hold CRRT tonight and resume in am tomorrow.   # AMS/acute metabolic encephalopathy: not improving. MRI and LP so far negative.   #Volume - was wet and rx'd w/ IV lasix  then CRRT. Now dry. Keeping positive +80/hr now  # Shock- remains on Levo and vasopressin .    # Multiple myeloma: Oncologist started Velcade  and Cytoxan , per oncology team.    # Neutropenia: post chemoRx, getting IV abx. Oncology following.   # Atrial fib-  on IV amio and IV diltiazem .  #Hypercalcemia -  resolved  now.   #Multifocal PNA - suspected aspiration + MSSE pna. Getting IV meropenem .   #Anemia - prn transfusions.  # Hyperuricemia: s/p rasburicase , +dialysis.  # Hypophosphatemia: due to CRRT, remains low. IV Naphos 30 mmol today. .  Subjective:  Seen in ICU MRI and LP yest were negative    Objective Vital signs in last 24 hours: Vitals:   07/19/23 1845 07/19/23 1900 07/19/23 1923 07/19/23 1928  BP: (!) 115/56     Pulse: 88 87    Resp: 20 20    Temp:   98.8 F (37.1 C)   TempSrc:   Axillary   SpO2: 97% 97%  99%  Weight:      Height:        Physical Exam: General: Critically ill looking male, intubated, sedated Neck: JVP is low Heart:RRR, s1s2  nl Lungs: Coarse breath sound bilateral Abdomen:soft, Non-tender, non-distended Extremities:No edema Neurology: not responsive, on vent   Frank Ahmani Prehn  MD  [facility] 07/19/2023, 10:05 PM  Recent Labs  Lab 07/18/23 0407 07/18/23 1602 07/19/23 0405 07/19/23 1641  HGB 7.7*  --  7.4*  --   ALBUMIN  2.0*  2.0*   < > 1.8*  1.9* 1.9*  CALCIUM  7.2*  7.0*   < > 7.1*  7.1* 7.1*  PHOS 2.0*  2.0*   < > 6.9*  6.6* 4.2  CREATININE 2.00*  1.94*   < > 3.21*  3.18* 2.70*  K 4.3  4.3   < > 4.9  4.7 4.6   < > = values in this interval not displayed.    Inpatient medications:  amiodarone   200 mg Per NG tube BID   Followed by   [name] ON 07/24/2023] amiodarone   200 mg Per NG tube Daily   busPIRone   15 mg Per Tube QHS   clonazePAM   1 mg Per Tube BID   docusate  100 mg Per Tube BID   epoetin  alfa  40,000 Units Subcutaneous QODAY   famotidine   10.4 mg Per Tube Daily   feeding supplement (PROSource TF20)  60 mL Per Tube TID   filgrastim  (NIVESTYM ) SQ  480 mcg Subcutaneous Daily   folic acid   1 mg Per Tube Daily  gabapentin   100 mg Per Tube Q12H   heparin  injection (subcutaneous)  5,000 Units Subcutaneous Q8H   insulin  aspart  0-20 Units Subcutaneous Q4H   insulin  aspart  8 Units Subcutaneous Q4H   insulin  glargine-yfgn  20 Units Subcutaneous BID   lactulose   20 g Per Tube Daily   [START ON 07/20/2023] nicotine   7 mg Transdermal Daily   mouth rinse  15 mL Mouth Rinse Q2H   PARoxetine   40 mg Per Tube Daily   pneumococcal 20-valent conjugate vaccine  0.5 mL Intramuscular Tomorrow-1000   polyethylene glycol  17 g Per Tube Daily   QUEtiapine   50 mg Per Tube BID   sodium chloride  flush  10-40 mL Intracatheter Q12H   thiamine   100 mg Per Tube Daily    dexmedetomidine  (PRECEDEX ) IV infusion Stopped (07/18/23 0945)   feeding supplement (VITAL 1.5 CAL) 60 mL/hr at 07/19/23 2100   fentaNYL  infusion INTRAVENOUS 175 mcg/hr (07/19/23 2100)   heparin  10,000 units/ 20 mL infusion syringe 750 Units/hr  (07/19/23 1911)   meropenem  (MERREM ) IV Stopped (07/19/23 1532)   norepinephrine  (LEVOPHED ) Adult infusion 14 mcg/min (07/19/23 2100)   prismasol  BGK 4/2.5 400 mL/hr at 07/19/23 2127   prismasol  BGK 4/2.5 400 mL/hr at 07/19/23 2127   prismasol  BGK 4/2.5 1,500 mL/hr at 07/19/23 1839   propofol  (DIPRIVAN ) infusion 35 mcg/kg/min (07/19/23 2100)   vancomycin  Stopped (07/19/23 1306)   vasopressin  0.03 Units/min (07/19/23 2100)   acetaminophen  (TYLENOL ) oral liquid 160 mg/5 mL **OR** acetaminophen , fentaNYL , heparin , LORazepam , mouth rinse, oxyCODONE , polyethylene glycol, polyvinyl alcohol , sodium chloride  flush

## 2023-07-19 NOTE — Progress Notes (Signed)
 Clearly, the chemotherapy is working.  The protein is now down to 8.8.  This is come down incredibly well.  The albumin  is 1.9.  He is on tube feeds.  His calcium  is 7.1.  Corrected for the low albumin , the calcium  is normal.  LDH is 214.  He is neutropenic.  I am surprised by this.  His white cell count is 0.6.  ANC is 200.  Hemoglobin 7.4.  Platelet count 100,000.  He is still intubated.  He had a CT of the brain yesterday.  There is no bleed.  There is no infarct.  I think so maybe have some temperatures.  I adjusted his antibiotics.  Is getting Merrem  right now.  He did have an LP that was done.  Culture was sent and so far is negative.  Blood cultures are negative.  The spinal fluid only had 1 white blood cell.  It was colorless.  Blood sugar was 106.  I would have to think that this is relatively normal given his diabetes.  Again, I realize that he has other issues.  I am thankful that the chemotherapy has helped.  Again, he is neutropenic.  We are holding chemo for right now.  He is on G-CSF.  Clearly, he is getting fantastic care from everybody in the ICU.  I know this is incredibly complicated.  Again he has a lot going on.  Hopefully, the myeloma part of his health is now beginning to respond.  [name], MD  [place] 14:14

## 2023-07-20 ENCOUNTER — Encounter (HOSPITAL_COMMUNITY): Payer: Self-pay

## 2023-07-20 ENCOUNTER — Inpatient Hospital Stay (HOSPITAL_COMMUNITY)

## 2023-07-20 DIAGNOSIS — J9601 Acute respiratory failure with hypoxia: Secondary | ICD-10-CM | POA: Diagnosis not present

## 2023-07-20 DIAGNOSIS — B9689 Other specified bacterial agents as the cause of diseases classified elsewhere: Secondary | ICD-10-CM

## 2023-07-20 DIAGNOSIS — G9340 Encephalopathy, unspecified: Secondary | ICD-10-CM | POA: Diagnosis not present

## 2023-07-20 DIAGNOSIS — C9000 Multiple myeloma not having achieved remission: Secondary | ICD-10-CM | POA: Diagnosis not present

## 2023-07-20 DIAGNOSIS — J9620 Acute and chronic respiratory failure, unspecified whether with hypoxia or hypercapnia: Secondary | ICD-10-CM

## 2023-07-20 DIAGNOSIS — E871 Hypo-osmolality and hyponatremia: Secondary | ICD-10-CM | POA: Diagnosis not present

## 2023-07-20 DIAGNOSIS — D709 Neutropenia, unspecified: Secondary | ICD-10-CM | POA: Diagnosis not present

## 2023-07-20 DIAGNOSIS — D696 Thrombocytopenia, unspecified: Secondary | ICD-10-CM

## 2023-07-20 LAB — COMPREHENSIVE METABOLIC PANEL WITH GFR
ALT: 24 U/L (ref 0–44)
AST: 9 U/L — ABNORMAL LOW (ref 15–41)
Albumin: 1.7 g/dL — ABNORMAL LOW (ref 3.5–5.0)
Alkaline Phosphatase: 39 U/L (ref 38–126)
Anion gap: 6 (ref 5–15)
BUN: 52 mg/dL — ABNORMAL HIGH (ref 6–20)
CO2: 24 mmol/L (ref 22–32)
Calcium: 7.2 mg/dL — ABNORMAL LOW (ref 8.9–10.3)
Chloride: 100 mmol/L (ref 98–111)
Creatinine, Ser: 2.25 mg/dL — ABNORMAL HIGH (ref 0.61–1.24)
GFR, Estimated: 33 mL/min — ABNORMAL LOW (ref >60)
Glucose, Bld: 137 mg/dL — ABNORMAL HIGH (ref 70–99)
Potassium: 4.5 mmol/L (ref 3.5–5.1)
Sodium: 130 mmol/L — ABNORMAL LOW (ref 135–145)
Total Bilirubin: 0.6 mg/dL (ref 0.0–1.2)
Total Protein: 8.2 g/dL — ABNORMAL HIGH (ref 6.5–8.1)

## 2023-07-20 LAB — GLUCOSE, CAPILLARY
Glucose-Capillary: 129 mg/dL — ABNORMAL HIGH (ref 70–99)
Glucose-Capillary: 106 mg/dL — ABNORMAL HIGH (ref 70–99)
Glucose-Capillary: 198 mg/dL — ABNORMAL HIGH (ref 70–99)
Glucose-Capillary: 224 mg/dL — ABNORMAL HIGH (ref 70–99)
Glucose-Capillary: 167 mg/dL — ABNORMAL HIGH (ref 70–99)
Glucose-Capillary: 160 mg/dL — ABNORMAL HIGH (ref 70–99)

## 2023-07-20 LAB — CBC WITH DIFFERENTIAL/PLATELET
Abs Immature Granulocytes: 0.01 10*3/uL (ref 0.00–0.07)
Basophils Absolute: 0 10*3/uL (ref 0.0–0.1)
Basophils Relative: 0 %
Eosinophils Absolute: 0 10*3/uL (ref 0.0–0.5)
Eosinophils Relative: 7 %
HCT: 22.5 % — ABNORMAL LOW (ref 39.0–52.0)
Hemoglobin: 6.8 g/dL — CL (ref 13.0–17.0)
Immature Granulocytes: 2 %
Lymphocytes Relative: 61 %
Lymphs Abs: 0.3 10*3/uL — ABNORMAL LOW (ref 0.7–4.0)
MCH: 30.4 pg (ref 26.0–34.0)
MCHC: 30.2 g/dL (ref 30.0–36.0)
MCV: 100.4 fL — ABNORMAL HIGH (ref 80.0–100.0)
Monocytes Absolute: 0.1 10*3/uL (ref 0.1–1.0)
Monocytes Relative: 15 %
Neutro Abs: 0.1 10*3/uL — CL (ref 1.7–7.7)
Neutrophils Relative %: 15 %
Platelets: 104 10*3/uL — ABNORMAL LOW (ref 150–400)
RBC: 2.24 MIL/uL — ABNORMAL LOW (ref 4.22–5.81)
RDW: 19.9 % — ABNORMAL HIGH (ref 11.5–15.5)
WBC: 0.6 10*3/uL — CL (ref 4.0–10.5)
nRBC: 52.7 % — ABNORMAL HIGH (ref 0.0–0.2)

## 2023-07-20 LAB — CBC
HCT: 23.3 % — ABNORMAL LOW (ref 39.0–52.0)
Hemoglobin: 7.1 g/dL — ABNORMAL LOW (ref 13.0–17.0)
MCH: 30.5 pg (ref 26.0–34.0)
MCHC: 30.5 g/dL (ref 30.0–36.0)
MCV: 100 fL (ref 80.0–100.0)
Platelets: 114 10*3/uL — ABNORMAL LOW (ref 150–400)
RBC: 2.33 MIL/uL — ABNORMAL LOW (ref 4.22–5.81)
RDW: 19.7 % — ABNORMAL HIGH (ref 11.5–15.5)
WBC: 0.4 10*3/uL — CL (ref 4.0–10.5)
nRBC: 48.6 % — ABNORMAL HIGH (ref 0.0–0.2)

## 2023-07-20 LAB — RENAL FUNCTION PANEL
Albumin: 1.6 g/dL — ABNORMAL LOW (ref 3.5–5.0)
Anion gap: 4 — ABNORMAL LOW (ref 5–15)
BUN: 48 mg/dL — ABNORMAL HIGH (ref 6–20)
CO2: 25 mmol/L (ref 22–32)
Calcium: 6.9 mg/dL — ABNORMAL LOW (ref 8.9–10.3)
Chloride: 101 mmol/L (ref 98–111)
Creatinine, Ser: 2.35 mg/dL — ABNORMAL HIGH (ref 0.61–1.24)
GFR, Estimated: 31 mL/min — ABNORMAL LOW (ref >60)
Glucose, Bld: 231 mg/dL — ABNORMAL HIGH (ref 70–99)
Phosphorus: 3 mg/dL (ref 2.5–4.6)
Potassium: 4.8 mmol/L (ref 3.5–5.1)
Sodium: 130 mmol/L — ABNORMAL LOW (ref 135–145)

## 2023-07-20 LAB — MAGNESIUM: Magnesium: 2.8 mg/dL — ABNORMAL HIGH (ref 1.7–2.4)

## 2023-07-20 LAB — APTT: aPTT: 31 s (ref 24–36)

## 2023-07-20 LAB — PHOSPHORUS: Phosphorus: 3.1 mg/dL (ref 2.5–4.6)

## 2023-07-20 LAB — PREPARE RBC (CROSSMATCH)

## 2023-07-20 LAB — LACTATE DEHYDROGENASE: LDH: 178 U/L (ref 98–192)

## 2023-07-20 MED ORDER — ARFORMOTEROL TARTRATE 15 MCG/2ML IN NEBU
15.0000 ug | INHALATION_SOLUTION | Freq: Two times a day (BID) | RESPIRATORY_TRACT | Status: DC
  Administered 2023-07-20 – 2023-09-17 (×104): 15 ug via RESPIRATORY_TRACT
  Filled 2023-07-20 (×112): qty 2

## 2023-07-20 MED ORDER — VORICONAZOLE 200 MG PO TABS
200.0000 mg | ORAL_TABLET | Freq: Two times a day (BID) | ORAL | Status: DC
  Filled 2023-07-20: qty 1

## 2023-07-20 MED ORDER — VORICONAZOLE 200 MG IV SOLR
6.0000 mg/kg | Freq: Two times a day (BID) | INTRAVENOUS | Status: AC
  Administered 2023-07-20 (×2): 530 mg via INTRAVENOUS
  Filled 2023-07-20 (×2): qty 530

## 2023-07-20 MED ORDER — ALTEPLASE 2 MG IJ SOLR
2.0000 mg | Freq: Once | INTRAMUSCULAR | Status: AC
  Administered 2023-07-20: 2 mg
  Filled 2023-07-20: qty 2

## 2023-07-20 MED ORDER — SODIUM CHLORIDE 0.9% IV SOLUTION: Freq: Once | INTRAVENOUS | Status: DC

## 2023-07-20 MED ORDER — SODIUM CHLORIDE 3 % IN NEBU
4.0000 mL | INHALATION_SOLUTION | RESPIRATORY_TRACT | Status: AC
  Administered 2023-07-20 – 2023-07-23 (×18): 4 mL via RESPIRATORY_TRACT
  Filled 2023-07-20 (×17): qty 4

## 2023-07-20 MED ORDER — IPRATROPIUM-ALBUTEROL 0.5-2.5 (3) MG/3ML IN SOLN
3.0000 mL | RESPIRATORY_TRACT | Status: DC
  Administered 2023-07-20 – 2023-07-26 (×35): 3 mL via RESPIRATORY_TRACT
  Filled 2023-07-20 (×19): qty 3
  Filled 2023-07-20: qty 9
  Filled 2023-07-20 (×14): qty 3

## 2023-07-20 MED ORDER — REVEFENACIN 175 MCG/3ML IN SOLN
175.0000 ug | Freq: Every day | RESPIRATORY_TRACT | Status: DC
  Administered 2023-07-20: 175 ug via RESPIRATORY_TRACT
  Filled 2023-07-20: qty 3

## 2023-07-20 NOTE — Progress Notes (Signed)
 Wife and a family friend updated at bedside.   Joesph Mussel, DO 07/20/23 2:30 PM [facility]  For contact information, see Amion. If no response to pager, please call PCCM consult pager. After hours, 7PM- 7AM, please call Elink.

## 2023-07-20 NOTE — Progress Notes (Signed)
 RT assisted with patient transport on vent to CT and returned to 1238 without complications.

## 2023-07-20 NOTE — Progress Notes (Signed)
 Pharmacy Antibiotic Note  Frank [name]. is a 58 y.o. male admitted on 07/03/2023 with AMS, hypercalcemia of malignancy, new MM diagnosis as a transfer from OSH.  Pharmacy has been dosing vancomycin  and meropenem  for pneumonia, and is now consulted for voriconazole dosing for aspergillus fumigatus in respiratory culture.   Scr 2.25.  Continues on CRRT (effluent flow rate ~ 22 ml/kg/hr) Fever, Tm 100.5 WBC low, neutropenia with ANC 0.1 (filgrastim  started 4/30>>)   Plan: Voriconazole loading dose 6 mg/kg (use adjusted weight for BMI > 30) x2 doses on 5/4, then start 200mg  PO BID on 5/5 Monitor DDI:   Strong Cyp3A4 inhibitor, may increase the concentration of oxycodone , quetiapine , buspar .  Monitor neuro effects. Monitor for additive QTc prolongation with amiodarone , quetiapine   (QTc:  0.44 from tele on 5/3 AM, RN to document QTc on 5/4)  Continue Meropenem  to 1g IV q8h for CRRT dosing.  Continue Vancomycin  1000 mg IV q24h for CRRT dosing.  Measure Vanc levels as needed.  Goal AUC = 400 - 550.  Follow up renal function, culture results, and clinical course.    Height: 6' (182.9 cm) Weight: 105.2 kg (231 lb 14.8 oz) IBW/kg (Calculated) : 77.6  Temp (24hrs), Avg:99.6 F (37.6 C), Min:98.8 F (37.1 C), Max:100.5 F (38.1 C)  Recent Labs  Lab 07/17/23 0441 07/17/23 1410 07/17/23 1638 07/18/23 0407 07/18/23 1602 07/19/23 0405 07/19/23 1641 07/20/23 0530  WBC 0.7* 0.5*  --  0.5*  --  0.6*  --  0.6*  CREATININE 1.95*  1.96*  --    < > 2.00*  1.94* 2.28* 3.21*  3.18* 2.70* 2.25*   < > = values in this interval not displayed.    Estimated Creatinine Clearance: 44.3 mL/min (A) (by C-G formula based on SCr of 2.25 mg/dL (H)).    Allergies  Allergen Reactions   Morphine And Codeine Anaphylaxis   Shellfish Allergy Anaphylaxis   Betadine  [Povidone Iodine ] Itching   Bupropion Other (See Comments)    Shaking of the body, hallucinations    Chlorhexidine  Hives    Patient  reports never had issues CHG with mouth rinse.   Influenza Vaccines Hives   Metoprolol  Rash   Antimicrobials this admission:  4/17 Azith >> 4/20  4/17 CTX >> 4/22 4/18 Fluconazole  > 4/23 4/22 Zosyn  >> 5/1 4/28: Vanco>> 5/1 Meropenem  >>  5/4 Voriconazole >>   Microbiology results:  4/19: MRSA PCR: neg 4/19 UCx: ngF 4/19 BCx: ngF 4/23 TA: few Staph epi: MRSE 4/28 BCx: ngF 4/30 TA: rare aspergillus fumigatus 5/2 CSF: ngtd   Kendall Pauls PharmD, BCPS [facility] main pharmacy [phone] 07/20/2023 9:00 AM

## 2023-07-20 NOTE — Consult Note (Signed)
 [facility]    Date of Admission:  07/03/2023   Total days of inpatient antibiotics         Reason for Consult: IPA    Principal Problem:   Generalized weakness Active Problems:   Multiple myeloma without remission (HCC)   Atrial fibrillation with rapid ventricular response (HCC)   AKI (acute kidney injury) (HCC)   Acute respiratory failure with hypoxia (HCC)   PAF (paroxysmal atrial fibrillation) (HCC)   Acute encephalopathy   Septic shock (HCC)   On mechanically assisted ventilation (HCC)   Acute metabolic encephalopathy   Assessment: 58 year old male with multiple medical conditions including obesity, diabetes type 2, seizure targeter, remote history of EtOH abuse presented from OSH where he was treated for community-acquired pneumonia, workup for multiple myeloma given lytic lesions transferred to [facility] for higher level of care.  He specifically underwent bone marrow biopsy on 4/18 complicated by hematoma at biopsy site.  Started on chemotherapy.  He was intubated and tracheal aspirates grew Aspergillus fumigatus.  #Acute respiratory failure secondary to unclear etiology(infectious vs inflammatory) # Tracheal aspirate growing Aspergillus  fumigatus #Febrile neutropenia ANC 100 #Newly diagnosed multiple myeloma on chemotherapy - Patient had tracheal aspirate cultures that grew MRSE on 4/23.  He remained intubated and on 6 started on CRRT.  Tracheal aspirate on 4/30 grew Aspergillus fumigatus and was started on voriconazole.  ID engaged due to concern for pulmonary aspergillosis. - Recommended CT chest which showed bilateral lower lobe airspace consolidation, bilateral pneumonia, multifocal patchy airspace and groundglass slightly nodular opacity that could be infectious versus inflammatory. Recommendations:  -Complete vanc + merrem  x 7 days -MRSA pcr -Follow serum aspergillus ag if negative stop vori.  Imaging does not appear c/w pulmonary  aspergillosis -Please consider bronch given ct findings.   Evaluation of this patient requires complex antimicrobial therapy evaluation and counseling + isolation needs for disease transmission risk assessment and mitigation   Microbiology:   Antibiotics: Meropenem  4/30-5/4 Vancomycin / 428-present Pip-tazo/ 422-4/30  Cultures: Blood  Urine  Other   HPI: Frank [name]. is a 58 y.o. male with past medical history of asthma, OSA on CPAP, obesity, current tobacco use, history of EtOH use x 15 years remission, GERD, hypertension, hyperlipidemia, diabetes type 2, BPH, seizure disorder initially presented as [facility] in [place] due to altered mental status.  He was admitted at OSH/13-16 for sepsis secondary to community-acquired pneumonia also had acute metabolic encephalopathy, AKI, lytic bone lesions with concern for multiple myeloma.  He was seen by medical oncology at OSH but per family request transfer to [facility] for further management.  Underwent bone marrow biopsy on 07/04/2023 complicated by hematoma.  Patient required intubation on 4/22.  Requiring CRRT.  Respiratory cultures grew MRSE on 4/23.  ID engaged as on/29 cultures grew Aspergillus fumigatus on 4/30   Review of Systems: Review of Systems  Unable to perform ROS: Intubated    Past Medical History:  Diagnosis Date   Anxiety    Arthritis    Asthma    Bipolar disorder (HCC)    Current every day smoker    Depression    Diabetes mellitus, type II (HCC)    Dyspnea    History of kidney stones    History of pneumonia    Hyperlipidemia    Hypertension    Morbid obesity (HCC)    Sedentary lifestyle    Seizures (HCC)    last seizure 12 yrs  ago, no current problem   Sleep apnea    uses CPAP nightly    Social History   Tobacco Use   Smoking status: Every Day    Current packs/day: 0.50    Average packs/day: 0.5 packs/day for 25.0 years (12.5 ttl pk-yrs)    Types: Cigarettes   Smokeless tobacco: Never  Vaping  Use   Vaping status: Never Used  Substance Use Topics   Alcohol  use: No   Drug use: Yes    Types: Marijuana    Comment: 01/28/2023    Family History  Problem Relation Age of Onset   Diabetes Mother    Hypertension Mother    Heart attack Mother    Osteoporosis Mother    Diabetes Father    Hypertension Father    Heart attack Father    Diabetes Sister    Hypertension Sister    Diabetes Brother    Hypertension Brother    Scheduled Meds:  sodium chloride    Intravenous Once   amiodarone   200 mg Per NG tube BID   Followed by   [name] ON 07/24/2023] amiodarone   200 mg Per NG tube Daily   arformoterol  15 mcg Nebulization BID   busPIRone   15 mg Per Tube QHS   clonazePAM   1 mg Per Tube BID   docusate  100 mg Per Tube BID   epoetin  alfa  40,000 Units Subcutaneous QODAY   famotidine   10.4 mg Per Tube Daily   feeding supplement (PROSource TF20)  60 mL Per Tube TID   filgrastim  (NIVESTYM ) SQ  480 mcg Subcutaneous Daily   folic acid   1 mg Per Tube Daily   gabapentin   100 mg Per Tube Q12H   heparin  injection (subcutaneous)  5,000 Units Subcutaneous Q8H   insulin  aspart  0-20 Units Subcutaneous Q4H   insulin  aspart  8 Units Subcutaneous Q4H   insulin  glargine-yfgn  20 Units Subcutaneous BID   ipratropium-albuterol   3 mL Nebulization Q4H   lactulose   20 g Per Tube Daily   nicotine   7 mg Transdermal Daily   mouth rinse  15 mL Mouth Rinse Q2H   PARoxetine   40 mg Per Tube Daily   pneumococcal 20-valent conjugate vaccine  0.5 mL Intramuscular Tomorrow-1000   polyethylene glycol  17 g Per Tube Daily   QUEtiapine   50 mg Per Tube BID   sodium chloride  flush  10-40 mL Intracatheter Q12H   sodium chloride  HYPERTONIC  4 mL Nebulization Q4H   thiamine   100 mg Per Tube Daily   [START ON 07/21/2023] voriconazole  200 mg Oral Q12H   Continuous Infusions:  dexmedetomidine  (PRECEDEX ) IV infusion Stopped (07/18/23 0945)   feeding supplement (VITAL 1.5 CAL) 60 mL/hr at 07/20/23 2100   fentaNYL   infusion INTRAVENOUS 50 mcg/hr (07/20/23 2100)   heparin  10,000 units/ 20 mL infusion syringe 750 Units/hr (07/20/23 1226)   meropenem  (MERREM ) IV 1 g (07/20/23 2146)   norepinephrine  (LEVOPHED ) Adult infusion 10 mcg/min (07/20/23 2100)   prismasol  BGK 4/2.5 400 mL/hr at 07/20/23 1011   prismasol  BGK 4/2.5 400 mL/hr at 07/20/23 1030   prismasol  BGK 4/2.5 1,500 mL/hr at 07/20/23 2040   propofol  (DIPRIVAN ) infusion Stopped (07/20/23 0841)   vancomycin  Stopped (07/20/23 1508)   vasopressin  0.03 Units/min (07/20/23 2100)   voriconazole Stopped (07/20/23 1349)   PRN Meds:.acetaminophen  (TYLENOL ) oral liquid 160 mg/5 mL **OR** acetaminophen , fentaNYL , heparin , LORazepam , mouth rinse, oxyCODONE , polyethylene glycol, polyvinyl alcohol , sodium chloride  flush Allergies  Allergen Reactions   Morphine And Codeine  Anaphylaxis   Shellfish Allergy Anaphylaxis   Betadine  [Povidone Iodine ] Itching   Bupropion Other (See Comments)    Shaking of the body, hallucinations    Chlorhexidine  Hives    Patient reports never had issues CHG with mouth rinse.   Influenza Vaccines Hives   Metoprolol  Rash    OBJECTIVE: Blood pressure (!) 114/59, pulse 89, temperature 99.3 F (37.4 C), temperature source Axillary, resp. rate 16, height 6' (1.829 m), weight 105.2 kg, SpO2 100%.  Physical Exam Constitutional:      Appearance: He is normal weight.     Comments: intubated  HENT:     Head: Normocephalic and atraumatic.     Right Ear: External ear normal.     Left Ear: External ear normal.     Nose: No congestion or rhinorrhea.     Mouth/Throat:     Mouth: Mucous membranes are moist.     Pharynx: Oropharynx is clear.  Eyes:     Extraocular Movements: Extraocular movements intact.     Conjunctiva/sclera: Conjunctivae normal.     Pupils: Pupils are equal, round, and reactive to light.  Cardiovascular:     Rate and Rhythm: Normal rate and regular rhythm.     Heart sounds: No murmur heard.    No friction rub.  No gallop.  Pulmonary:     Effort: Pulmonary effort is normal.  Abdominal:     General: Abdomen is flat. Bowel sounds are normal.     Palpations: Abdomen is soft.  Musculoskeletal:     Cervical back: Normal range of motion and neck supple.  Skin:    General: Skin is warm and dry.     Lab Results Lab Results  Component Value Date   WBC 0.4 (LL) 07/20/2023   HGB 7.1 (L) 07/20/2023   HCT 23.3 (L) 07/20/2023   MCV 100.0 07/20/2023   PLT 114 (L) 07/20/2023    Lab Results  Component Value Date   CREATININE 2.35 (H) 07/20/2023   BUN 48 (H) 07/20/2023   NA 130 (L) 07/20/2023   K 4.8 07/20/2023   CL 101 07/20/2023   CO2 25 07/20/2023    Lab Results  Component Value Date   ALT 24 07/20/2023   AST 9 (L) 07/20/2023   ALKPHOS 39 07/20/2023   BILITOT 0.6 07/20/2023       Orlie Bjornstad, MD [facility] 07/20/2023, 10:23 PM

## 2023-07-20 NOTE — Progress Notes (Signed)
 [facility] NEPHROLOGY PROGRESS NOTE  Assessment/ Plan: Pt is a 58 y.o. yo male with past medical history significant for HTN, HLD, type II DM, BPH, seizure who was initially presented at [facility] due to AMS, admitted for sepsis due to pneumonia, encephalopathy, sepsis, AKI, hypercalcemia symptomatic anemia and Lytic pulm lesion concern for multiple myeloma.  # AKI on CKD 3a - b/l creat 1.1- 1.5 from 2024, eGFR 53- >60 ml/min. Creat here 5.8 on presentation at OSH. AKI due to possible myeloma kidney +/- hyperCa +/- ACEi (home med). Initially treated with IV fluid and then required Lasix  because of fluid overload. Started CRRT on 4/23. Pt remains anuric, and tolerating CRRT well.    # AMS/acute metabolic encephalopathy: not improving. MRI and LP so far negative.   #Volume - was wet and rx'd w/ IV lasix  then CRRT. Then was dry. Today for the 1st time this week pt looks well hydrated (not dehydrated). Cont +80 ml/hr w/ crrt for now   # Shock- remains on Levo 10 and vasopressin  0.03  # Multiple myeloma: Oncologist started Velcade  and Cytoxan , per oncology team.    # Neutropenia: post chemoRx, getting IV abx. Oncology following.   # Atrial fib-  on IV amio and IV diltiazem .  #Hypercalcemia -  resolved  now.   #Multifocal PNA - suspected aspiration + MSSE pna. Getting IV meropenem .   #Anemia - prn transfusions.  # Hyperuricemia: s/p rasburicase , +dialysis.  # Hypophosphatemia: due to CRRT, remains low. IV Naphos 30 mmol today. .  Subjective:  Seen in ICU Remains altered    Objective Vital signs in last 24 hours: Vitals:   07/20/23 1050 07/20/23 1100 07/20/23 1110 07/20/23 1115  BP: 126/78 (!) 118/54 (!) 118/54 (!) 115/54  Pulse: (!) 102 (!) 102 99   Resp: 20 19 18    Temp: (!) 101.1 F (38.4 C)     TempSrc:      SpO2: 96% 98% 97% 98%  Weight:      Height:        Physical Exam: General: Critically ill looking male, intubated Looks better today,  less dry on exam Heart:RRR, s1s2 nl Lungs: Coarse breath sound bilateral Abdomen:soft, Non-tender, non-distended Extremities:No edema Neurology: not responsive, on vent   Frank Brennin Durfee  MD  [facility] 07/20/2023, 11:45 AM  Recent Labs  Lab 07/19/23 0405 07/19/23 1641 07/20/23 0530  HGB 7.4*  --  6.8*  ALBUMIN  1.8*  1.9* 1.9* 1.7*  CALCIUM  7.1*  7.1* 7.1* 7.2*  PHOS 6.9*  6.6* 4.2 3.1  CREATININE 3.21*  3.18* 2.70* 2.25*  K 4.9  4.7 4.6 4.5    Inpatient medications:  sodium chloride    Intravenous Once   amiodarone   200 mg Per NG tube BID   Followed by   [name] ON 07/24/2023] amiodarone   200 mg Per NG tube Daily   arformoterol  15 mcg Nebulization BID   busPIRone   15 mg Per Tube QHS   clonazePAM   1 mg Per Tube BID   docusate  100 mg Per Tube BID   epoetin  alfa  40,000 Units Subcutaneous QODAY   famotidine   10.4 mg Per Tube Daily   feeding supplement (PROSource TF20)  60 mL Per Tube TID   filgrastim  (NIVESTYM ) SQ  480 mcg Subcutaneous Daily   folic acid   1 mg Per Tube Daily   gabapentin   100 mg Per Tube Q12H   heparin  injection (subcutaneous)  5,000 Units Subcutaneous Q8H   insulin  aspart  0-20  Units Subcutaneous Q4H   insulin  aspart  8 Units Subcutaneous Q4H   insulin  glargine-yfgn  20 Units Subcutaneous BID   ipratropium-albuterol   3 mL Nebulization Q4H   lactulose   20 g Per Tube Daily   nicotine   7 mg Transdermal Daily   mouth rinse  15 mL Mouth Rinse Q2H   PARoxetine   40 mg Per Tube Daily   pneumococcal 20-valent conjugate vaccine  0.5 mL Intramuscular Tomorrow-1000   polyethylene glycol  17 g Per Tube Daily   QUEtiapine   50 mg Per Tube BID   sodium chloride  flush  10-40 mL Intracatheter Q12H   sodium chloride  HYPERTONIC  4 mL Nebulization Q4H   thiamine   100 mg Per Tube Daily   [START ON 07/21/2023] voriconazole  200 mg Oral Q12H    dexmedetomidine  (PRECEDEX ) IV infusion Stopped (07/18/23 0945)   feeding supplement (VITAL 1.5 CAL) 60 mL/hr at 07/20/23 1100   fentaNYL   infusion INTRAVENOUS Stopped (07/20/23 1058)   heparin  10,000 units/ 20 mL infusion syringe 750 Units/hr (07/19/23 2230)   meropenem  (MERREM ) IV Stopped (07/20/23 1610)   norepinephrine  (LEVOPHED ) Adult infusion 11 mcg/min (07/20/23 1100)   prismasol  BGK 4/2.5 400 mL/hr at 07/20/23 1011   prismasol  BGK 4/2.5 400 mL/hr at 07/19/23 2127   prismasol  BGK 4/2.5 1,500 mL/hr at 07/20/23 0840   propofol  (DIPRIVAN ) infusion Stopped (07/20/23 0841)   vancomycin  Stopped (07/19/23 1306)   vasopressin  0.03 Units/min (07/20/23 1100)   voriconazole     acetaminophen  (TYLENOL ) oral liquid 160 mg/5 mL **OR** acetaminophen , fentaNYL , heparin , LORazepam , mouth rinse, oxyCODONE , polyethylene glycol, polyvinyl alcohol , sodium chloride  flush

## 2023-07-20 NOTE — Progress Notes (Signed)
 NAME:  Jody Hartkopf., MRN:  [id number], DOB:  04/09/1965, LOS: 17 ADMISSION DATE:  07/03/2023, CONSULTATION DATE:  07/06/23 REFERRING MD:  Dr Maury Space, CHIEF COMPLAINT:  Encephalopathy   History of Present Illness:  PCCM asked to see patient for encephalopathy   Transferred from [facility] with 1 week of generalized weakness, altered mentation, decreased appetite Recently being worked up for multiple myeloma.  Transferred to [facility] for oncology evaluation Nephrology consulted for AKI   Background history of obesity, asthma, obstructive sleep apnea on CPAP GERD, hypertension, hyperlipidemia Has been feeling weak for the last few days according to spouse Admitted with concern for sepsis, community-acquired pneumonia, antibiotic encephalopathy Workup revealed lytic bony lesions with concern for multiple myeloma Transfused with 3 units packed red blood cells Evaluation so far-bone marrow 07/04/2023  Pertinent  Medical History   Past Medical History:  Diagnosis Date   Anxiety    Arthritis    Asthma    Bipolar disorder (HCC)    Current every day smoker    Depression    Diabetes mellitus, type II (HCC)    Dyspnea    History of kidney stones    History of pneumonia    Hyperlipidemia    Hypertension    Morbid obesity (HCC)    Sedentary lifestyle    Seizures (HCC)    last seizure 12 yrs ago, no current problem   Sleep apnea    uses CPAP nightly   Significant Hospital Events: Including procedures, antibiotic start and stop dates in addition to other pertinent events   4/19-blood cultures, MRSA PCR negative 4/19 chest x-ray with mild pulmonary congestion 4/20 atrial fibrillation with RVR 4/21 nosebleed after IR NG attempt, NG placed by PCCM in afternoon, stable transferred to [facility] 4/22 intubated; some abla requiring prbcs; ct abd/pelvis showing acute groin hematoma tracking from posterior presumed from site of bone marrow biopsy; heparin  held EEG 4/25 >> moderate to severe  encephalopathy without any seizures or epileptiform discharges 4/23 respiratory culture >> MRSE 4/28 remains intubated on CRRT, down to one pressor  4/29 labile BP, NE back up. Responded a bit to fluids. Received Velcade  and Cytoxan  chemotherapy 5/1 remains encephalopathic, check CTH  Interim History / Subjective:  Overnight no acute events. 1 unit pRBC ordered this morning. Tmax 100.5.  Objective   Blood pressure (!) 100/58, pulse 88, temperature (!) 100.5 F (38.1 C), temperature source Oral, resp. rate 14, height 6' (1.829 m), weight 105.2 kg, SpO2 99%.    Vent Mode: PSV;CPAP FiO2 (%):  [30 %] 30 % Set Rate:  [20 bmp] 20 bmp Vt Set:  [540 mL] 540 mL PEEP:  [5 cmH20] 5 cmH20 Pressure Support:  [15 cmH20] 15 cmH20 Plateau Pressure:  [17 cmH20-21 cmH20] 17 cmH20   Intake/Output Summary (Last 24 hours) at 07/20/2023 0836 Last data filed at 07/20/2023 0800 Gross per 24 hour  Intake 3753.09 ml  Output 1833.1 ml  Net 1919.99 ml   Filed Weights   07/19/23 0500 07/20/23 0703 07/20/23 0800  Weight: 99 kg 105.2 kg 105.2 kg   Physical Exam: General: Chronically ill-appearing male lying in bed no acute distress  HEENT: [place]/AT, eyes anicteric Neuro: RASS -4, moderate strength cough CV: S1-S2, regular rate and rhythm PULM: Bilateral wheezing, rhonchi improved with suctioning, tan secretions GI: soft, NT Extremities: anasarca, L femoral line, R groin hematoma stable Skin: no diffuse rashes  Micro: 4/28 BCx > no growth, final 4/30 respiratory culture-few PMN, rare budding yeast, abundant GPC> few  aspergillus fumigatus  Sodium 130 K+ 4.5 BUN 52, creatinine 2.25 on CRRT WBC 0.6 H/H 6.8/22.5 Platelets 104  glucose in the 200s> 130s  CSF studies 1 RBC, 1 WBC, glucose 106 CSF culture-2 organisms acute, rare WBC> NGTD Cryptococcal antigen negative meningitis and encephalitis panel negative  Quantiferon pending VDRL pending  Assessment & Plan:   Acute hypoxemic respiratory  failure requiring mechanical ventilation due to aspiration and MSSE pneumonia 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  Mental status has been a large barrier to extubation - Foley management CRRT - ID consult today -With wheezing we will start bronchodilators today-Brovana and Yupelri.  Trying to avoid ICS given increased risk of airway colonization.  Shock, presumed septic- multifactorial, initially was felt to be hypovolemic at play along with probable MRSE PNA (only culprit as a source thus far). Echo 4/21 reassuring. Neutropenia due to chemotherapy -Vanc, meropenem , voriconazole - Cultures - Several CSF studies 5/2 pending -Repeat blood cultures pending  Multiple myeloma Possible tumor lysis syndrome - labs improved with CRRT -appreciate Oncology's management; received Velcade  and Cytotoxan 4/29,  Velcade  held 5/2.   Paroxysmal atrial fibrillation with RVR - converted to NSR/ST 4/22 -Holding anticoagulation due to bleeding thrombocytopenia earlier in the admission -Continue amiodarone  - Appreciate cardiology's assistance - If recurrent A-fib would restart anticoagulation now that platelet count has recovered  AKI on CRRT Hypervolemic hyponatremia -Continue CRRT, appreciate nephrology's management - Strict I's/O - Renally dose meds and avoid nephrotoxic   Right groin hematoma, stable- tracks from site of bone marrow biopsy down to thigh on CT. Hgb now stable.  -Transfuse for hemoglobin less than 7 significantly-1 unit PRBCs today   Acute metabolic encephalopathy - Multifactorial, sepsis, hypercalcemia, myeloma ICU delirium; brain MRI- reassuring, and enteritis unlikely based on initial studies -Mental status continues to be a barrier for  extubation - PAD protocol for sedation - Continue PTA Paxil  and gabapentin  to minimize withdrawal symptoms  Hyperammonemia, resolved History of seizure disorder - No evidence seizures on EEG 4/25.   - Holding Depakote  given hyperammonemia> may not be able to restart this - Daily lactulose  - PAD protocol for sedation - Monitor for seizures, especially on meropenem     History of obstructive sleep apnea -Will need CPAP nightly after extubation   Hypoalbuminemia --Continue tube feeds   T2DM with uncontrolled hyperglycemia - Continue insulin  glargine 20 units twice daily - Continue to be encouraged to units every 4 hours - Sliding scale insulin  as needed - Goal blood glucose 140-180.  Depression/anxiety - PTA BuSpar  and Paxil  continued   Peripheral neuropathy - Continue PTA gabapentin   History of tobacco abuse - Decreased nicotine  patch to 7 mcg patch on 5/3  Best Practice (right click and "Reselect all SmartList Selections" daily)   Diet/type: tubefeeds DVT prophylaxis prophylactic heparin   Pressure ulcer(s): N/A GI prophylaxis: H2B Lines: Central line and Dialysis Catheter Foley:  removal ordered  5/3 Code Status:  full code Last date of multidisciplinary goals of care discussion [family update pending 4/28]  This patient is critically ill with multiple organ system failure which requires frequent high complexity decision making, assessment, support, evaluation, and titration of therapies. This was completed through the application of advanced monitoring technologies and extensive interpretation of multiple databases. During this encounter critical care time was devoted to patient care services  described in this note for 45 minutes.  Joesph Mussel, DO 07/20/23 10:05 AM [facility]  For contact information, see Amion. If no response to pager, please call PCCM consult pager. After hours, 7PM- 7AM, please call Elink.

## 2023-07-20 NOTE — Progress Notes (Signed)
 Step-brother updated at bedside.  Joesph Mussel, DO 07/20/23 10:39 AM [facility]

## 2023-07-20 NOTE — Progress Notes (Addendum)
 eLink Physician-Brief Progress Note Patient Name: Frank [name]. DOB: Feb 24, 1966 MRN: [id number]   Date of Service  07/20/2023  HPI/Events of Note  Vented with poor safety awareness  eICU Interventions  Soft wrist restraints for patient safety   0616 -hemoglobin 6.8 known hematoma but no obvious signs of bleeding.  Transfuse 1 unit PRBC  Intervention Category Minor Interventions: Agitation / anxiety - evaluation and management  Jamesetta Greenhalgh 07/20/2023, 2:30 AM

## 2023-07-21 DIAGNOSIS — A419 Sepsis, unspecified organism: Secondary | ICD-10-CM

## 2023-07-21 DIAGNOSIS — G9341 Metabolic encephalopathy: Secondary | ICD-10-CM | POA: Diagnosis not present

## 2023-07-21 DIAGNOSIS — G9340 Encephalopathy, unspecified: Secondary | ICD-10-CM | POA: Diagnosis not present

## 2023-07-21 DIAGNOSIS — Z87891 Personal history of nicotine dependence: Secondary | ICD-10-CM | POA: Diagnosis not present

## 2023-07-21 DIAGNOSIS — N1831 Chronic kidney disease, stage 3a: Secondary | ICD-10-CM

## 2023-07-21 DIAGNOSIS — R6521 Severe sepsis with septic shock: Secondary | ICD-10-CM | POA: Diagnosis not present

## 2023-07-21 DIAGNOSIS — A411 Sepsis due to other specified staphylococcus: Secondary | ICD-10-CM | POA: Diagnosis not present

## 2023-07-21 DIAGNOSIS — Z515 Encounter for palliative care: Secondary | ICD-10-CM | POA: Diagnosis not present

## 2023-07-21 DIAGNOSIS — R531 Weakness: Secondary | ICD-10-CM | POA: Diagnosis not present

## 2023-07-21 DIAGNOSIS — J181 Lobar pneumonia, unspecified organism: Secondary | ICD-10-CM

## 2023-07-21 DIAGNOSIS — L89313 Pressure ulcer of right buttock, stage 3: Secondary | ICD-10-CM | POA: Diagnosis not present

## 2023-07-21 DIAGNOSIS — C9000 Multiple myeloma not having achieved remission: Secondary | ICD-10-CM | POA: Diagnosis not present

## 2023-07-21 DIAGNOSIS — J9601 Acute respiratory failure with hypoxia: Secondary | ICD-10-CM | POA: Diagnosis not present

## 2023-07-21 LAB — CBC WITH DIFFERENTIAL/PLATELET
Abs Immature Granulocytes: 0.04 10*3/uL (ref 0.00–0.07)
Basophils Absolute: 0 10*3/uL (ref 0.0–0.1)
Basophils Relative: 0 %
Eosinophils Absolute: 0.1 10*3/uL (ref 0.0–0.5)
Eosinophils Relative: 7 %
HCT: 23.1 % — ABNORMAL LOW (ref 39.0–52.0)
Hemoglobin: 7 g/dL — ABNORMAL LOW (ref 13.0–17.0)
Immature Granulocytes: 6 %
Lymphocytes Relative: 43 %
Lymphs Abs: 0.3 10*3/uL — ABNORMAL LOW (ref 0.7–4.0)
MCH: 30.8 pg (ref 26.0–34.0)
MCHC: 30.3 g/dL (ref 30.0–36.0)
MCV: 101.8 fL — ABNORMAL HIGH (ref 80.0–100.0)
Monocytes Absolute: 0.2 10*3/uL (ref 0.1–1.0)
Monocytes Relative: 29 %
Neutro Abs: 0.1 10*3/uL — CL (ref 1.7–7.7)
Neutrophils Relative %: 15 %
Platelets: 123 10*3/uL — ABNORMAL LOW (ref 150–400)
RBC: 2.27 MIL/uL — ABNORMAL LOW (ref 4.22–5.81)
RDW: 20.1 % — ABNORMAL HIGH (ref 11.5–15.5)
WBC: 0.7 10*3/uL — CL (ref 4.0–10.5)
nRBC: 26.1 % — ABNORMAL HIGH (ref 0.0–0.2)

## 2023-07-21 LAB — RENAL FUNCTION PANEL
Albumin: <1.5 g/dL — ABNORMAL LOW (ref 3.5–5.0)
Albumin: 1.7 g/dL — ABNORMAL LOW (ref 3.5–5.0)
Anion gap: 6 (ref 5–15)
Anion gap: 7 (ref 5–15)
BUN: 42 mg/dL — ABNORMAL HIGH (ref 6–20)
BUN: 38 mg/dL — ABNORMAL HIGH (ref 6–20)
CO2: 23 mmol/L (ref 22–32)
CO2: 24 mmol/L (ref 22–32)
Calcium: 7.3 mg/dL — ABNORMAL LOW (ref 8.9–10.3)
Calcium: 7.2 mg/dL — ABNORMAL LOW (ref 8.9–10.3)
Chloride: 103 mmol/L (ref 98–111)
Chloride: 101 mmol/L (ref 98–111)
Creatinine, Ser: 1.79 mg/dL — ABNORMAL HIGH (ref 0.61–1.24)
Creatinine, Ser: 1.74 mg/dL — ABNORMAL HIGH (ref 0.61–1.24)
GFR, Estimated: 43 mL/min — ABNORMAL LOW (ref >60)
GFR, Estimated: 45 mL/min — ABNORMAL LOW (ref >60)
Glucose, Bld: 172 mg/dL — ABNORMAL HIGH (ref 70–99)
Glucose, Bld: 120 mg/dL — ABNORMAL HIGH (ref 70–99)
Phosphorus: 2.3 mg/dL — ABNORMAL LOW (ref 2.5–4.6)
Phosphorus: 2.4 mg/dL — ABNORMAL LOW (ref 2.5–4.6)
Potassium: 5.2 mmol/L — ABNORMAL HIGH (ref 3.5–5.1)
Potassium: 4.9 mmol/L (ref 3.5–5.1)
Sodium: 132 mmol/L — ABNORMAL LOW (ref 135–145)
Sodium: 132 mmol/L — ABNORMAL LOW (ref 135–145)

## 2023-07-21 LAB — GLUCOSE, CAPILLARY
Glucose-Capillary: 113 mg/dL — ABNORMAL HIGH (ref 70–99)
Glucose-Capillary: 124 mg/dL — ABNORMAL HIGH (ref 70–99)
Glucose-Capillary: 162 mg/dL — ABNORMAL HIGH (ref 70–99)
Glucose-Capillary: 177 mg/dL — ABNORMAL HIGH (ref 70–99)
Glucose-Capillary: 186 mg/dL — ABNORMAL HIGH (ref 70–99)
Glucose-Capillary: 98 mg/dL (ref 70–99)

## 2023-07-21 LAB — MAGNESIUM: Magnesium: 2.6 mg/dL — ABNORMAL HIGH (ref 1.7–2.4)

## 2023-07-21 LAB — PHOSPHORUS: Phosphorus: 2.4 mg/dL — ABNORMAL LOW (ref 2.5–4.6)

## 2023-07-21 LAB — TYPE AND SCREEN
ABO/RH(D): A POS
Antibody Screen: NEGATIVE
Unit division: 0
Unit division: 0
Unit division: 0

## 2023-07-21 LAB — VDRL, CSF: VDRL Quant, CSF: NONREACTIVE

## 2023-07-21 LAB — BPAM RBC
Blood Product Expiration Date: 202505302359
Blood Product Expiration Date: 202505302359
Blood Product Expiration Date: 202506032359
ISSUE DATE / TIME: 202505010629
ISSUE DATE / TIME: 202505011815
ISSUE DATE / TIME: 202505040959
Unit Type and Rh: 6200
Unit Type and Rh: 6200
Unit Type and Rh: 6200

## 2023-07-21 LAB — CBC
HCT: 24.8 % — ABNORMAL LOW (ref 39.0–52.0)
Hemoglobin: 7.4 g/dL — ABNORMAL LOW (ref 13.0–17.0)
MCH: 30.2 pg (ref 26.0–34.0)
MCHC: 29.8 g/dL — ABNORMAL LOW (ref 30.0–36.0)
MCV: 101.2 fL — ABNORMAL HIGH (ref 80.0–100.0)
Platelets: 112 10*3/uL — ABNORMAL LOW (ref 150–400)
RBC: 2.45 MIL/uL — ABNORMAL LOW (ref 4.22–5.81)
RDW: 20.1 % — ABNORMAL HIGH (ref 11.5–15.5)
WBC: 1.1 10*3/uL — CL (ref 4.0–10.5)
nRBC: 20.2 % — ABNORMAL HIGH (ref 0.0–0.2)

## 2023-07-21 LAB — HEMOGLOBIN AND HEMATOCRIT, BLOOD
HCT: 27 % — ABNORMAL LOW (ref 39.0–52.0)
Hemoglobin: 8.2 g/dL — ABNORMAL LOW (ref 13.0–17.0)

## 2023-07-21 LAB — PREPARE RBC (CROSSMATCH)

## 2023-07-21 LAB — APTT: aPTT: 34 s (ref 24–36)

## 2023-07-21 LAB — MRSA NEXT GEN BY PCR, NASAL: MRSA by PCR Next Gen: NOT DETECTED

## 2023-07-21 LAB — TRIGLYCERIDES: Triglycerides: 73 mg/dL (ref <150)

## 2023-07-21 MED ORDER — VORICONAZOLE 200 MG IV SOLR
300.0000 mg | Freq: Two times a day (BID) | INTRAVENOUS | Status: DC
  Administered 2023-07-21 – 2023-07-22 (×2): 300 mg via INTRAVENOUS
  Filled 2023-07-21 (×4): qty 300

## 2023-07-21 MED ORDER — SODIUM CHLORIDE 0.9% IV SOLUTION: Freq: Once | INTRAVENOUS | Status: AC

## 2023-07-21 MED ORDER — LORAZEPAM 2 MG/ML IJ SOLN
1.0000 mg | INTRAMUSCULAR | Status: AC | PRN
  Administered 2023-07-21: 2 mg via INTRAVENOUS
  Filled 2023-07-21: qty 1

## 2023-07-21 MED ORDER — OXYCODONE HCL 5 MG PO TABS
5.0000 mg | ORAL_TABLET | Freq: Four times a day (QID) | ORAL | Status: DC | PRN
  Administered 2023-07-21 – 2023-07-25 (×2): 10 mg
  Filled 2023-07-21 (×2): qty 2

## 2023-07-21 MED ORDER — FUROSEMIDE 10 MG/ML IJ SOLN
40.0000 mg | Freq: Once | INTRAMUSCULAR | Status: AC
  Administered 2023-07-21: 40 mg via INTRAVENOUS
  Filled 2023-07-21: qty 4

## 2023-07-21 MED ORDER — SODIUM PHOSPHATES 45 MMOLE/15ML IV SOLN
30.0000 mmol | Freq: Once | INTRAVENOUS | Status: AC
  Administered 2023-07-21: 30 mmol via INTRAVENOUS
  Filled 2023-07-21: qty 10

## 2023-07-21 NOTE — Consult Note (Signed)
 Consultation Note Date: 07/21/2023   Patient Name: Frank [name].  DOB: 08/03/1965  MRN: [id number]  Age / Sex: 58 y.o., male  PCP: Frank Danes, MD Referring Physician:  Joesph Mussel., MD  Reason for Consultation:  GOC  HPI/Patient Profile: 58 y.o. male  with past medical history of OSA on CPAP, GERD, HTN, HLD, DM2, BPH, seizure disorder admitted on 07/03/2023 as a transfer from [facility] health due to AMS. Workup revealed severe sepsis due to pneumonia and metabolic encephalopathy. He also has been newly diagnosed with multiple myeloma this admission and treatment started with Velcade  and Cytoxan . Admission has been complicated by pancytopenias, septic shock due to pneumonia with respiratory failure requiring intubation (intubated on 4/22), kidney injury- now on CRRT, is anuric. He is currently on vasopressor support.  Palliative medicine consulted for GOC.   Primary Decision Maker NEXT OF KIN - spouse  Discussion: Chart reviewed including labs, progress notes, imaging from this and previous encounters.  Case discussed with Frank Chester, NP and Dr. Bertrum [name] of PCCM.  Noted per Oncology notes- "Clearly, the chemotherapy is working."- hopeful that myeloma is responding to treatment.  I called and spoke with his spouseMoira [name].  Introduced Palliative medicine and reason for consult.  Frank [name] notes she is grateful for the care Frank [name] has been receiving.  We discussed his current illness and possible trajectories.  Frank [name] understands that [name]'s status is tenuous and "could go either way".  She continues to be hopeful for improvement.  Advanced Care Planning 30 mins With her permission we discussed advanced care planning and anticipated healthcare decisions.  I reviewed the possibility of needing a tracheostomy and PEG tube with prolonged ventilation.  Code status was also discussed.  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  Frank [name] plans to discuss advanced care planning with her daughter- requests full scope and full code for now.  Plan made to meet in person on Wednesday May 7th at 4pm for further GOC discussion.    SUMMARY OF RECOMMENDATIONS -Full scope, full code -Spouse discussing trach/PEG with daughter -Plan to meet on Wednesday at 4pm for in person GOC discussion    Code Status/Advance Care Planning:   Code Status: Full Code    Prognosis:   Unable to determine  Discharge Planning: To Be Determined  Primary Diagnoses: Present on Admission: **None**   Review of Systems  Physical Exam Vitals and nursing note reviewed.  Constitutional:      Appearance: He is ill-appearing.  Cardiovascular:     Rate and Rhythm: Normal rate.  Pulmonary:     Comments: intubated Neurological:     Mental Status: He is alert.     Comments: sedated     Vital Signs: BP 103/60   Pulse 95   Temp 98.1 F (36.7 C) (Axillary)   Resp 20   Ht 6' (1.829 m)  Wt 105.2 kg   SpO2 99%   BMI 31.45 kg/m  Pain Scale: CPOT POSS *See Group Information*: S-Acceptable,Sleep, easy to arouse Pain Score: 0-No pain   SpO2: SpO2: 99 % O2 Device:SpO2: 99 % O2 Flow Rate: .O2 Flow Rate (L/min): 15 L/min  IO: Intake/output summary:  Intake/Output Summary (Last 24 hours) at 07/21/2023 1255 Last data filed at 07/21/2023 1200 Gross per 24 hour  Intake 3280.91 ml  Output 1460 ml  Net 1820.91 ml    LBM: Last BM Date : 07/21/23 Baseline Weight: Weight: 110.6 kg Most recent weight: Weight: 105.2 kg       Thank you for this consult. Palliative medicine will continue to follow and assist as needed.  Time Total:  90 minutes Signed by: Frank [name], AGNP-C Palliative Medicine  Time includes:   Preparing to see the patient (e.g., review of  tests) Obtaining and/or reviewing separately obtained history Performing a medically necessary appropriate examination and/or evaluation Counseling and educating the patient/family/caregiver Ordering medications, tests, or procedures Referring and communicating with other health care professionals (when not reported separately) Documenting clinical information in the electronic or other health record Independently interpreting results (not reported separately) and communicating results to the patient/family/caregiver Care coordination (not reported separately) Clinical documentation   Please contact Palliative Medicine Team phone at [phone] for questions and concerns.  For individual provider: See Frank [name]

## 2023-07-21 NOTE — Progress Notes (Signed)
 NAME:  Frank Chavana., MRN:  [id number], DOB:  1966/03/13, LOS: 18 ADMISSION DATE:  07/03/2023, CONSULTATION DATE:  07/06/23 REFERRING MD:  Dr Maury Space, CHIEF COMPLAINT:  Encephalopathy   History of Present Illness:  PCCM asked to see patient for encephalopathy   Transferred from [facility] with 1 week of generalized weakness, altered mentation, decreased appetite Recently being worked up for multiple myeloma.  Transferred to [facility] for oncology evaluation Nephrology consulted for AKI   Background history of obesity, asthma, obstructive sleep apnea on CPAP GERD, hypertension, hyperlipidemia Has been feeling weak for the last few days according to spouse Admitted with concern for sepsis, community-acquired pneumonia, antibiotic encephalopathy Workup revealed lytic bony lesions with concern for multiple myeloma Transfused with 3 units packed red blood cells Evaluation so far-bone marrow 07/04/2023  Pertinent  Medical History   Past Medical History:  Diagnosis Date   Anxiety    Arthritis    Asthma    Bipolar disorder (HCC)    Current every day smoker    Depression    Diabetes mellitus, type II (HCC)    Dyspnea    History of kidney stones    History of pneumonia    Hyperlipidemia    Hypertension    Morbid obesity (HCC)    Sedentary lifestyle    Seizures (HCC)    last seizure 12 yrs ago, no current problem   Sleep apnea    uses CPAP nightly   Significant Hospital Events: Including procedures, antibiotic start and stop dates in addition to other pertinent events   4/19-blood cultures, MRSA PCR negative 4/19 chest x-ray with mild pulmonary congestion 4/20 atrial fibrillation with RVR 4/21 nosebleed after IR NG attempt, NG placed by PCCM in afternoon, stable transferred to [facility] 4/22 intubated; some abla requiring prbcs; ct abd/pelvis showing acute groin hematoma tracking from posterior presumed from site of bone marrow biopsy; heparin  held EEG 4/25 >> moderate to severe  encephalopathy without any seizures or epileptiform discharges 4/23 respiratory culture >> MRSE 4/28 remains intubated on CRRT, down to one pressor  4/29 labile BP, NE back up. Responded a bit to fluids. Received Velcade  and Cytoxan  chemotherapy 5/1 remains encephalopathic, check CTH 5/2 LP ETT exchange  5/5 ID dc vori. 1 PRBC   Interim History / Subjective:  NAEO  Remains on 7 Ne and 0.03 vaso this morning, intubated    Objective   Blood pressure 125/89, pulse 98, temperature 98.1 F (36.7 C), temperature source Axillary, resp. rate 20, height 6' (1.829 m), weight 105.2 kg, SpO2 98%.    Vent Mode: PRVC FiO2 (%):  [30 %] 30 % Set Rate:  [20 bmp] 20 bmp Vt Set:  [540 mL] 540 mL PEEP:  [5 cmH20] 5 cmH20 Pressure Support:  [10 cmH20] 10 cmH20   Intake/Output Summary (Last 24 hours) at 07/21/2023 1059 Last data filed at 07/21/2023 1018 Gross per 24 hour  Intake 3481.89 ml  Output 1567 ml  Net 1914.89 ml   Filed Weights   07/19/23 0500 07/20/23 0703 07/20/23 0800  Weight: 99 kg 105.2 kg 105.2 kg   Physical Exam: General: Chronically and critically ill M getting CPT  HEENT: NCAT. Beard. ETT secure. Anicteric sclera  Neuro:  Awake. Weakly following commands on r  CV: rr s1s2 no rgm  PULM:  Mechanically ventilated, lung sounds obscured by CPT  GI: round soft  Extremities: R groin hematoma  Skin: c/d/w   __________________________________________________________  Micro: 4/28 BCx > no growth, final 4/30 respiratory  culture-few PMN, rare budding yeast, abundant GPC> few aspergillus fumigatus  CSF studies 1 RBC, 1 WBC, glucose 106 CSF culture-2 organisms acute, rare WBC> NGTD Cryptococcal antigen negative meningitis and encephalitis panel negative  Quantiferon pending VDRL pending  Assessment & Plan:   Acute encephalopathy, improving Hx sz disorder  Hyperammonemia, improved  -wean sedation -- RASS goal 0 to -1 -delirium precautions  -cont home paxil  gabapentin    -cont lactulose  -holding home VPA -- may not be able to resume   Acute respiratory failure with hypoxia Hx OSA  Hx tobacco use  -originally aspiration of blood 2/2 epistaxis -MSSE PNA -aspirgillus fumigatus + but after d/w ID 5/5 less likely invasive infection  P -cont MV support. VAP, pulm hygiene.  Has been intubated since 4/22 -- need to start talking about GOC/ possible trach  -dc vori per ID 5/5  -cont brovana, yupelri. Avoiding ICS  -nicotine    Septic shock  -MSSA PNA P -as above dc vori 5/5 -cont mero, vanc. MRSA PCR 5/5 pending  -MAP goal > 65 -- NE and vaso for goal  -follow cx data   Multiple Myeloma Neutropenia due to chemo  Possible TLS -appreciate Oncology's management; received Velcade  and Cytotoxan 4/29,  Velcade  held 5/2.  AKI on CKD 3a  HypoNa HyperK Hypophos HyperMag P -RRT per nephro   Paroxysmal atrial fibrillation with RVR - converted to NSR/ST 4/22 P -amio  -AC was deferred w bleeding issues this admission, now that plt are improving could consider AC should he go back into fib  Anemia -partly ABLA; R groin hematoma following BMBx  Thrombocytopenia  P -T&S 5/5 and 1 PRBC for hgb 7  -follow CBC   DM2 w hyperglycemia -BID glargine 20 u  - rSSI  -8 u novolog  q4hr EN coverage    Best Practice (right click and "Reselect all SmartList Selections" daily)   Diet/type: tubefeeds DVT prophylaxis prophylactic heparin   Pressure ulcer(s): identified on: 19/Apr/2025 GI prophylaxis: H2B Lines: Central line and Dialysis Catheter Foley:  removal ordered  5/3 Code Status:  full code Last date of multidisciplinary goals of care discussion [family update pending 4/28]  CRITICAL CARE Performed by: Delories Fetter   Total critical care time: 41 minutes  Critical care time was exclusive of separately billable procedures and treating other patients. Critical care was necessary to treat or prevent imminent or life-threatening  deterioration.  Critical care was time spent personally by me on the following activities: development of treatment plan with patient and/or surrogate as well as nursing, discussions with consultants, evaluation of patient's response to treatment, examination of patient, obtaining history from patient or surrogate, ordering and performing treatments and interventions, ordering and review of laboratory studies, ordering and review of radiographic studies, pulse oximetry and re-evaluation of patient's condition.  Eston Hence MSN, AGACNP-BC [place] [facility] Amion for pager  07/21/2023, 10:59 AM

## 2023-07-21 NOTE — Progress Notes (Signed)
 Pharmacy Antibiotic Note  Frank [name]. is a 58 y.o. male admitted on 07/03/2023 with hypoxic respiratory failure/PNA.  Pharmacy has been consulted for Voriconazole + Vancomycin  + Merrem  dosing.  The patient had a 4/30 respiratory culture that is growing aspergillus fumigatus. The patient has started chemotherapy and is now neutropenic. The plan is to monitor the patient on voriconazole for improvement. The patient was loaded with voriconazole on 5/3 and has missed only 1 dose. Will resume maintenance dosing - with IV - diluent  should be removed with CRRT.   QTc 444 - will monitor. AST/ALT 9/24 - stable. Last LD given 5/4 @ 2230. On CRRT since 4/23   Plan: - Resume Voriconazole 300 mg IV every 12 hours - Continue Vancomycin  1g IV every 24 hours - while on CRRT - Will hold on Vancomycin  levels for now - Continue Meropenem  1g IV every 8 hours - while on CRRT - Will continue to follow CRRT tolerance, culture results, LOT, and antibiotic de-escalation plans    Height: 6' (182.9 cm) Weight: 105.2 kg (231 lb 14.8 oz) IBW/kg (Calculated) : 77.6  Temp (24hrs), Avg:98.9 F (37.2 C), Min:98.1 F (36.7 C), Max:99.7 F (37.6 C)  Recent Labs  Lab 07/19/23 0405 07/19/23 1641 07/20/23 0530 07/20/23 1656 07/21/23 0015 07/21/23 0540  WBC 0.6*  --  0.6* 0.4* 1.1* 0.7*  CREATININE 3.21*  3.18* 2.70* 2.25* 2.35*  --  1.79*    Estimated Creatinine Clearance: 55.7 mL/min (A) (by C-G formula based on SCr of 1.79 mg/dL (H)).    Allergies  Allergen Reactions   Morphine And Codeine Anaphylaxis   Shellfish Allergy Anaphylaxis   Betadine  [Povidone Iodine ] Itching   Bupropion Other (See Comments)    Shaking of the body, hallucinations    Chlorhexidine  Hives    Patient reports never had issues CHG with mouth rinse.   Influenza Vaccines Hives   Metoprolol  Rash    Antimicrobials this admission: Azithromycin  4/17 x 1; then 4/19 >> 4/20 CRO 4/17 >> 4/22 Fluconazole  4/18 >> 4/22 Zosyn  4/22  >> 5/1 Vancomycin  4/28 >>  Meropenem  5/1 >>  Voriconazole 5/4 >>  Dose adjustments this admission: N/a  Microbiology results: 4/19 MRSA PCR >> neg 4/19 UCx >> NGf 4/19 BCx >> NGf 4/23 RCx (TA) >> few MRSE 4/28 BCx >> NGf 4/30 RCx (TA) >> rare aspergillus fumigatus 5/2 CSF cx >> ngx3d 5/4 RCx (TA) >> 5/5 BCx >> ng<12h  Thank you for allowing pharmacy to be a part of this patient's care.  Garland Junk, PharmD, BCPS, BCIDP Infectious Diseases Clinical Pharmacist 07/21/2023 2:36 PM   **Pharmacist phone directory can now be found on [email or web address] ([email or web address]).  Listed under [facility].

## 2023-07-21 NOTE — Progress Notes (Addendum)
 [facility]  Date of Admission:  07/03/2023       Lines: Ett 5/2-c Right internal jugular Hd cath 4/23-c  Left femoral triple lumen cvc 4/22-c   Abx: 4/17 Azith >> 4/20  4/17 CTX >> 4/22 4/18 Fluconazole  > 4/23 4/22 Zosyn  >> 5/1 4/28: Vanco>>5/05 5/1 Meropenem  >>  5/4 Voriconazole >>   Other: Dexamethasone  4/18-20 Methyl pred medrol  dose pack 3/20 for 6 days   ASSESSMENT/recs 58 yo male with left orif 2024/2024, hx hip and knee arthroplasty, tobacco use/copd, marijuanna use, ckd3, aggressive MM recent dx 06/2023, admitted 4/22 in transfer from [place] for 1 week generalized weakness found to have aki, acute on chronic anemia, confusion. Course complicated by hypoxemic respiratory failure, sepsis, pna   #neutropenic fever #hypox resp failure #pna #shock #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 Query colonizer.   Has been on prolonged iv abx piptazo --> vanc/meropenem ; respiratory cx shows normal flora 4/21 tte normal ef  Abx hasn't done much if anything with the fever curve  While more likely colonizer, given severity (shock/ongoing fever -- ?latter due to non-id cause), will give 1 week of voriconazole to see if any changes (if no changes would stop). Tumor lysis/mm certainly can cause inflammatory response/fever  Pjp would be higher consideration in MM than aspergillus but again not likely in this early MM chemo treatment   Micro: 5/5 bcx in progress 5/4 resp cx few gnr, few yeaste, rare gpc 5/2 csf pcr negative; csf culture negative 4/30 resp cx rare aspergillus fumigatus 4/28 bcx ngtd 4/23 tracheal aspirate mrse 4/19 bcx ngtd 4/19 ucx negative 4/19 mrsa nares negative   5/5 assessment -continue voriconazole; will give 2 week to assess response -to complete w/u would ask rt to send trach aspirate for pjp -please ask RT to send mini BAL for pjp cytology -continue  meropenem /vanc -- > if in 2 days 5/5 bcx negative will d'c (neutropenic fever without micro/clinical evidence of bacterial infection can be monitored off abx)    #ams 5/2 lp normal glucose, protein, and only 1 wbc Suspect metabolic    #aki on ckd Shock, MM related and ace-I at home Crrt started here On pressors in setting crrt   #mm #tumor lysis #hypercalcemia Aggressive cytogenetics  Chemo 4/22 as mentioned Rasburicase  Onc following     Principal Problem:   Generalized weakness Active Problems:   Multiple myeloma without remission (HCC)   Atrial fibrillation with rapid ventricular response (HCC)   AKI (acute kidney injury) (HCC)   Acute respiratory failure with hypoxia (HCC)   PAF (paroxysmal atrial fibrillation) (HCC)   Acute encephalopathy   Septic shock (HCC)   On mechanically assisted ventilation (HCC)   Acute metabolic encephalopathy   Allergies  Allergen Reactions   Morphine And Codeine Anaphylaxis   Shellfish Allergy Anaphylaxis   Betadine  [Povidone Iodine ] Itching   Bupropion Other (See Comments)    Shaking of the body, hallucinations    Chlorhexidine  Hives    Patient reports never had issues CHG with mouth rinse.   Influenza Vaccines Hives   Metoprolol  Rash    Scheduled Meds:  amiodarone   200 mg Per NG tube BID   Followed by   [name] ON 07/24/2023] amiodarone   200 mg Per NG tube Daily   arformoterol  15 mcg Nebulization BID   busPIRone   15 mg Per Tube QHS   clonazePAM   1 mg Per Tube  BID   docusate  100 mg Per Tube BID   epoetin  alfa  40,000 Units Subcutaneous QODAY   famotidine   10.4 mg Per Tube Daily   feeding supplement (PROSource TF20)  60 mL Per Tube TID   filgrastim  (NIVESTYM ) SQ  480 mcg Subcutaneous Daily   folic acid   1 mg Per Tube Daily   gabapentin   100 mg Per Tube Q12H   heparin  injection (subcutaneous)  5,000 Units Subcutaneous Q8H   insulin  aspart  0-20 Units Subcutaneous Q4H   insulin  aspart  8 Units Subcutaneous Q4H    insulin  glargine-yfgn  20 Units Subcutaneous BID   ipratropium-albuterol   3 mL Nebulization Q4H   lactulose   20 g Per Tube Daily   nicotine   7 mg Transdermal Daily   mouth rinse  15 mL Mouth Rinse Q2H   PARoxetine   40 mg Per Tube Daily   pneumococcal 20-valent conjugate vaccine  0.5 mL Intramuscular Tomorrow-1000   polyethylene glycol  17 g Per Tube Daily   QUEtiapine   50 mg Per Tube BID   sodium chloride  flush  10-40 mL Intracatheter Q12H   sodium chloride  HYPERTONIC  4 mL Nebulization Q4H   thiamine   100 mg Per Tube Daily   Continuous Infusions:  dexmedetomidine  (PRECEDEX ) IV infusion Stopped (07/18/23 0945)   feeding supplement (VITAL 1.5 CAL) 60 mL/hr at 07/21/23 1300   fentaNYL  infusion INTRAVENOUS 125 mcg/hr (07/21/23 1300)   heparin  10,000 units/ 20 mL infusion syringe 750 Units/hr (07/21/23 0800)   meropenem  (MERREM ) IV Stopped (07/21/23 4132)   norepinephrine  (LEVOPHED ) Adult infusion 9 mcg/min (07/21/23 1300)   prismasol  BGK 4/2.5 400 mL/hr at 07/21/23 1301   prismasol  BGK 4/2.5 400 mL/hr at 07/21/23 1302   prismasol  BGK 4/2.5 1,500 mL/hr at 07/21/23 1324   propofol  (DIPRIVAN ) infusion Stopped (07/20/23 0841)   sodium PHOSPHATE  IVPB (in mmol) 43 mL/hr at 07/21/23 1300   vancomycin  1,000 mg (07/21/23 1321)   vasopressin  0.03 Units/min (07/21/23 1300)   PRN Meds:.acetaminophen  (TYLENOL ) oral liquid 160 mg/5 mL **OR** acetaminophen , fentaNYL , heparin , LORazepam , mouth rinse, oxyCODONE , polyethylene glycol, polyvinyl alcohol , sodium chloride  flush   SUBJECTIVE: Ongoing fever shock Remains on crrt No rash  Review of Systems: ROS All other ROS was negative, except mentioned above     OBJECTIVE: Vitals:   07/21/23 1300 07/21/23 1315 07/21/23 1327 07/21/23 1330  BP: (!) 97/52 (!) 92/54 (!) 97/54 (!) 95/53  Pulse: 90  87   Resp: 20  20   Temp: 99.5 F (37.5 C)  99.7 F (37.6 C)   TempSrc: Axillary  Axillary   SpO2: 99%  100%   Weight:      Height:       Body  mass index is 31.45 kg/m.  Physical Exam General/constitutional: intubated/sedated on pressors, crrt; ill appearing HEENT: Normocephalic, PER, Conj Clear CV: rrr no mrg Lungs: on vent 50% fio2 Abd: Soft, Nontender Ext: generalized edema Skin: No Rash Neuro: sedated MSK: no peripheral joint swelling/tenderness/warmth   Central line presence: cvc right internal jugular and groin cvc no bleeding/purulence   Lab Results Lab Results  Component Value Date   WBC 0.7 (LL) 07/21/2023   HGB 7.0 (L) 07/21/2023   HCT 23.1 (L) 07/21/2023   MCV 101.8 (H) 07/21/2023   PLT 123 (L) 07/21/2023    Lab Results  Component Value Date   CREATININE 1.79 (H) 07/21/2023   BUN 42 (H) 07/21/2023   NA 132 (L) 07/21/2023   K 5.2 (H) 07/21/2023   CL  103 07/21/2023   CO2 23 07/21/2023    Lab Results  Component Value Date   ALT 24 07/20/2023   AST 9 (L) 07/20/2023   ALKPHOS 39 07/20/2023   BILITOT 0.6 07/20/2023      Microbiology: Recent Results (from the past 240 hours)  Culture, blood (Routine X 2) w Reflex to ID Panel     Status: None   Collection Time: 07/14/23  9:10 PM   Specimen: BLOOD RIGHT HAND  Result Value Ref Range Status   Specimen Description   Final    BLOOD RIGHT HAND Performed at [facility], [address], [place], [place] [address]    Special Requests   Final    BOTTLES DRAWN AEROBIC ONLY Blood Culture results may not be optimal due to an inadequate volume of blood received in culture bottles Performed at [facility], [address] [place], [place] [address]    Culture   Final    NO GROWTH 5 DAYS Performed at [facility], [address] [place], [place] [address]    Report Status 07/19/2023 FINAL  Final  Culture, blood (Routine X 2) w Reflex to ID Panel     Status: None   Collection Time: 07/14/23  9:10 PM   Specimen: BLOOD  Result Value Ref Range Status   Specimen Description   Final    BLOOD LEFT ANTECUBITAL Performed at  [facility], [address] [place], [place] [address]    Special Requests   Final    BOTTLES DRAWN AEROBIC AND ANAEROBIC Blood Culture adequate volume Performed at [facility], [address] [place], [place] [address]    Culture   Final    NO GROWTH 5 DAYS Performed at [facility], [address] [place], [place] [address]    Report Status 07/19/2023 FINAL  Final  Culture, Respiratory w Gram Stain     Status: None   Collection Time: 07/16/23  2:54 PM   Specimen: Tracheal Aspirate; Respiratory  Result Value Ref Range Status   Specimen Description   Final    TRACHEAL ASPIRATE Performed at [facility], [address] [place], [place] [address]    Special Requests   Final    NONE Performed at [facility], [address], [place], [place] [address]    Gram Stain   Final    FEW WBC PRESENT, PREDOMINANTLY PMN RARE BUDDING YEAST SEEN ABUNDANT GRAM POSITIVE COCCI IN PAIRS Performed at [facility], [address] [place], [place] [address]    Culture RARE ASPERGILLUS FUMIGATUS  Final   Report Status 07/19/2023 FINAL  Final  CSF culture w Gram Stain     Status: None (Preliminary result)   Collection Time: 07/18/23  1:32 PM   Specimen: PATH Cytology CSF; Cerebrospinal Fluid  Result Value Ref Range Status   Specimen Description   Final    CSF Performed at [facility], [address], [place], [place] [address]    Special Requests   Final    Immunocompromised Performed at [facility], [address], [place], [place] [address]    Gram Stain   Final    NO ORGANISMS SEEN RARE WBC PRESENT, PREDOMINANTLY PMN Gram Stain Report Called to,Read Back By and Verified With: RN H [facility] AT 1443 07/18/23 CRUICKSHANK A GRAM STAIN REVIEWED-AGREE WITH RESULT DRT    Culture   Final  NO GROWTH 3 DAYS Performed at [facility], [address]  [place], [place] [address]    Report Status PENDING  Incomplete  Culture, Respiratory w Gram Stain     Status: None (Preliminary result)   Collection Time: 07/20/23 11:38 AM   Specimen: Tracheal Aspirate; Respiratory  Result Value Ref Range Status   Specimen Description   Final    TRACHEAL ASPIRATE Performed at [facility], [address] [place], [place] [address]    Special Requests   Final    NONE Performed at [facility], [address], [place], [place] [address]    Gram Stain   Final    ABUNDANT WBC PRESENT, PREDOMINANTLY PMN FEW SQUAMOUS EPITHELIAL CELLS PRESENT FEW GRAM NEGATIVE RODS FEW YEAST RARE GRAM POSITIVE COCCI    Culture   Final    CULTURE REINCUBATED FOR BETTER GROWTH Performed at [facility], [address] [place], [place] [address]    Report Status PENDING  Incomplete  Culture, blood (Routine X 2) w Reflex to ID Panel     Status: None (Preliminary result)   Collection Time: 07/21/23  1:27 AM   Specimen: BLOOD  Result Value Ref Range Status   Specimen Description   Final    BLOOD BLOOD LEFT HAND Performed at [facility], [address], [place], [place] [address]    Special Requests   Final    BOTTLES DRAWN AEROBIC ONLY Blood Culture results may not be optimal due to an inadequate volume of blood received in culture bottles Performed at [facility], [address], [place], [place] [address]    Culture   Final    NO GROWTH < 12 HOURS Performed at [facility], [address], [place], [place] [address]    Report Status PENDING  Incomplete  Culture, blood (Routine X 2) w Reflex to ID Panel     Status: None (Preliminary result)   Collection Time: 07/21/23  1:27 AM   Specimen: BLOOD  Result Value Ref Range Status   Specimen Description   Final    BLOOD BLOOD LEFT HAND Performed at [facility], [address], [place], [place] [address]     Special Requests   Final    BOTTLES DRAWN AEROBIC ONLY Blood Culture results may not be optimal due to an inadequate volume of blood received in culture bottles Performed at [facility], [address] [place], [place] [address]    Culture   Final    NO GROWTH < 12 HOURS Performed at [facility], [address] [place], [place] [address]    Report Status PENDING  Incomplete  MRSA Next Gen by PCR, Nasal     Status: None   Collection Time: 07/21/23  8:08 AM   Specimen: Nasal Mucosa; Nasal Swab  Result Value Ref Range Status   MRSA by PCR Next Gen NOT DETECTED NOT DETECTED Final    Comment: (NOTE) The GeneXpert MRSA Assay (FDA approved for NASAL specimens only), is one component of a comprehensive MRSA colonization surveillance program. It is not intended to diagnose MRSA infection nor to guide or monitor treatment for MRSA infections. Test performance is not FDA approved in patients less than 11 years old. Performed at [facility], [address] [place], [place] [address]      Serology:   Imaging: If present, new imagings (plain films, ct scans,  and mri) have been personally visualized and interpreted; radiology reports have been reviewed. Decision making incorporated into the Impression / Recommendations.  5/2 dvt duplex bilateral lower ext, negative  5/4 chest ct noncontrast 1. Bilateral lower lobe airspace consolidation compatible with pneumonia. 2. Multifocal patchy airspace areas and ill-defined ground-glass slightly nodular opacities throughout the bilateral upper lobes and minimally in the right middle lobe, also likely infectious/inflammatory. 3. Small bilateral pleural effusions. 4. Mild cardiomegaly. 5. Cholelithiasis. 6. Scattered lucent lesions throughout the thoracic spine measuring up to 8 mm, nonspecific. Findings may be related to multiple myeloma or metastatic disease. 7. Minimal compression deformity of  the superior endplate of T11.  5/2 mri brain No acute intracranial abnormality.   Nonspecific white matter signal abnormality likely reflecting moderate chronic microvascular ischemic changes.   Multiple calvarial lesions corresponding to lytic lesions on CT. Findings concerning for multiple myeloma versus metastasis.   1.5 cm cystic lesion in the subcutaneous tissues overlying the nasal bones likely reflecting epidermoid cyst versus dermoid. No evidence of connection to the intracranial compartment.   Large right mastoid effusion.    4/21 tte  1. Left ventricular ejection fraction, by estimation, is 65 to 70%. The  left ventricle has normal function. The left ventricle has no regional  wall motion abnormalities. Left ventricular diastolic parameters are  indeterminate.   2. Right ventricular systolic function is normal. The right ventricular  size is normal. There is normal pulmonary artery systolic pressure. The  estimated right ventricular systolic pressure is 35.8 mmHg.   3. Left atrial size was moderately dilated.   4. The mitral valve is normal in structure. No evidence of mitral valve  regurgitation. No evidence of mitral stenosis.   5. The aortic valve is tricuspid. Aortic valve regurgitation is not  visualized. Aortic valve sclerosis/calcification is present, without any  evidence of aortic stenosis. Aortic valve mean gradient measures 8.0 mmHg.   6. Aortic dilatation noted. There is mild dilatation of the aortic root,  measuring 39 mm.   7. The inferior vena cava is dilated in size with <50% respiratory  variability, suggesting right atrial pressure of 15 mmHg.   8. The patient was in atrial fibrillation.    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   Frank Mcbride, MD [facility] [phone] pager    07/21/2023, 1:50 PM

## 2023-07-21 NOTE — Progress Notes (Signed)
 CPT on hold due to pt getting an bath.

## 2023-07-21 NOTE — Progress Notes (Signed)
 eLink Physician-Brief Progress Note Patient Name: Frank [name]. DOB: October 11, 1965 MRN: [id number]   Date of Service  07/21/2023  HPI/Events of Note  Vented with poor safety awareness  eICU Interventions  Soft wrist restraints for patient safety       Abbygayle Helfand 07/21/2023, 1:13 AM

## 2023-07-21 NOTE — Progress Notes (Addendum)
 Clemente Nemiah Banister.   DOB:September 03, 1965   ZD#:[id number]      ASSESSMENT & PLAN:  Multiple myeloma - Patient was transferred from [facility] mid April 2025 with history of generalized weakness, decreased appetite and altered mental status.  Lytic lesions were concerning for multiple myeloma. 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 - Initiated chemotherapy with Velcade  and Cytoxan  07/08/23 with second dose given 07/11/23.  He was also given high-dose steroids.  Myeloma levels began to trend downwards in the right direction. - Currently chemotherapy regimen is on hold due to patient's acute respiratory failure. - Medical oncology/Dr. Maria Shiner following closely  Acute respiratory failure Hypoxia - Patient is currently intubated, intubated since 4/22 - Continue vent support and supportive care as per critical care team.  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  Recommend PRBC transfusion for hemoglobin less than or equal to 7.0 - Monitor CBC with differential closely  AKI - Elevated creatinine - On hemodialysis, continue per nephrology  Altered mental status - Patient was admitted with agitation and confusion.  Currently he is intubated. - Continue supportive care  Hypertension Hyperlipidemia Diabetes - Monitor blood pressure levels - Monitor blood glucose levels, insulin  coverage per protocol - Medicine following    Code Status Full  Subjective:  Patient remains intubated and sedated.  Seen receiving hemodialysis.  Discussed case with nurse.  Objective:  Vitals:   07/21/23 1115 07/21/23 1120  BP: (!) 86/49 (!) 87/49  Pulse:    Resp:    Temp:    SpO2:       Intake/Output Summary (Last 24 hours) at 07/21/2023 1132 Last data filed at 07/21/2023 1100 Gross per 24 hour  Intake 3534.46 ml  Output 1451 ml  Net 2083.46 ml     PHYSICAL  EXAMINATION: ECOG PERFORMANCE STATUS: 4 - Bedbound  Vitals:   07/21/23 1115 07/21/23 1120  BP: (!) 86/49 (!) 87/49  Pulse:    Resp:    Temp:    SpO2:     Filed Weights   07/19/23 0500 07/20/23 0703 07/20/23 0800  Weight: 218 lb 4.1 oz (99 kg) 231 lb 14.8 oz (105.2 kg) 231 lb 14.8 oz (105.2 kg)    GENERAL: + Ventilated + sedated SKIN: + Pale skin color, texture, turgor are normal, no rashes or significant lesions EYES: normal, conjunctiva are pink and non-injected, sclera clear OROPHARYNX: + Intubated  NECK: supple, thyroid normal size, non-tender, without nodularity LYMPH: no palpable lymphadenopathy in the cervical, axillary or inguinal LUNGS: clear to auscultation and percussion with normal breathing effort HEART: regular rate & rhythm and no murmurs and no lower extremity edema ABDOMEN: abdomen soft, non-tender and normal bowel sounds MUSCULOSKELETAL: no cyanosis of digits and no clubbing  PSYCH: + Sedated    All questions were answered. The patient knows to call the clinic with any problems, questions or concerns.   The total time spent in the appointment was 40 minutes encounter with patient including review of chart and various tests results, discussions about plan of care and coordination of care plan  Jacqualin Mate, NP 07/21/2023 11:32 AM    Labs Reviewed:  Lab Results  Component Value Date   WBC 0.7 (LL) 07/21/2023   HGB 7.0 (L) 07/21/2023   HCT 23.1 (L) 07/21/2023   MCV 101.8 (H) 07/21/2023   PLT 123 (L) 07/21/2023   Recent Labs    07/18/23 0407  07/18/23 1602 07/19/23 0405 07/19/23 1641 07/20/23 0530 07/20/23 1656 07/21/23 0540  NA 131*  129*   < > 132*  130*   < > 130* 130* 132*  K 4.3  4.3   < > 4.9  4.7   < > 4.5 4.8 5.2*  CL 101  99   < > 100  97*   < > 100 101 103  CO2 26  25   < > 23  22   < > 24 25 23   GLUCOSE 167*  166*   < > 223*  216*   < > 137* 231* 172*  BUN 46*  47*   < > 71*  71*   < > 52* 48* 42*  CREATININE 2.00*   1.94*   < > 3.21*  3.18*   < > 2.25* 2.35* 1.79*  CALCIUM  7.2*  7.0*   < > 7.1*  7.1*   < > 7.2* 6.9* 7.3*  GFRNONAA 38*  39*   < > 21*  22*   < > 33* 31* 43*  PROT 9.4*  --  8.8*  --  8.2*  --   --   ALBUMIN  2.0*  2.0*   < > 1.8*  1.9*   < > 1.7* 1.6* <1.5*  AST 19  --  14*  --  9*  --   --   ALT 40  --  34  --  24  --   --   ALKPHOS 35*  --  37*  --  39  --   --   BILITOT 0.7  --  0.7  --  0.6  --   --    < > = values in this interval not displayed.    Studies Reviewed:  CT CHEST WO CONTRAST Result Date: 07/20/2023 CLINICAL DATA:  Sepsis EXAM: CT CHEST WITHOUT CONTRAST TECHNIQUE: Multidetector CT imaging of the chest was performed following the standard protocol without IV contrast. RADIATION DOSE REDUCTION: This exam was performed according to the departmental dose-optimization program which includes automated exposure control, adjustment of the mA and/or kV according to patient size and/or use of iterative reconstruction technique. COMPARISON:  None Available. FINDINGS: Cardiovascular: No significant vascular findings. The heart is mildly enlarged. There is no pericardial effusion. Aorta is normal in size. Central venous catheter tip ends in the SVC. Mediastinum/Nodes: Endotracheal tube tip is 2.4 cm above the carina. Enteric tube is seen throughout nondilated esophagus. No enlarged lymph nodes are identified allowing for lack of intravenous contrast. The visualized thyroid gland is within normal limits. Lungs/Pleura: There are small bilateral pleural effusions. There is bilateral lower lobe airspace consolidation. Multifocal patchy airspace areas and ill-defined ground-glass slightly nodular opacities are seen throughout the bilateral upper lobes and minimally in the right middle lobe, also likely infectious/inflammatory. There is no pneumothorax. Upper Abdomen: Gallstones are present. Enteric tube is partially visualized in the stomach. Musculoskeletal: There are severe degenerative changes  in both shoulders. The bones are diffusely osteopenic. There are scattered lucent lesions throughout the thoracic spine measuring up to 8 mm. There is minimal compression deformity of the superior endplate of T11. IMPRESSION: 1. Bilateral lower lobe airspace consolidation compatible with pneumonia. 2. Multifocal patchy airspace areas and ill-defined ground-glass slightly nodular opacities throughout the bilateral upper lobes and minimally in the right middle lobe, also likely infectious/inflammatory. 3. Small bilateral pleural effusions. 4. Mild cardiomegaly. 5. Cholelithiasis. 6. Scattered lucent lesions throughout the thoracic spine measuring up to 8 mm, nonspecific. Findings  may be related to multiple myeloma or metastatic disease. 7. Minimal compression deformity of the superior endplate of T11. Electronically Signed   By: Tyron Gallon M.D.   On: 07/20/2023 16:31   VAS US  LOWER EXTREMITY VENOUS (DVT) Result Date: 07/18/2023  Lower Venous DVT Study Patient Name:  Bayler Boyack.  Date of Exam:   07/18/2023 Medical Rec #: [id number]           Accession #:    [id number] Date of Birth: 1966/01/28           Patient Gender: M Patient Age:   67 years Exam Location:  [facility] Procedure:      VAS US  LOWER EXTREMITY VENOUS (DVT) Referring Phys: ADITYA PALIWAL --------------------------------------------------------------------------------  Indications: Swelling, and Edema.  Risk Factors: Immobility Cancer myeloma. Limitations: Patient positioning. Comparison Study: None. Performing Technologist: Estanislao Heimlich  Examination Guidelines: A complete evaluation includes B-mode imaging, spectral Doppler, color Doppler, and power Doppler as needed of all accessible portions of each vessel. Bilateral testing is considered an integral part of a complete examination. Limited examinations for reoccurring indications may be performed as noted. The reflux portion of the exam is performed with the patient in reverse  Trendelenburg.  +-----+---------------+---------+-----------+----------+--------------+ RIGHTCompressibilityPhasicitySpontaneityPropertiesThrombus Aging +-----+---------------+---------+-----------+----------+--------------+ CFV  Full           Yes      Yes                                 +-----+---------------+---------+-----------+----------+--------------+ Large hematoma noted at top of right leg  +---------+---------------+---------+-----------+----------+-------------------+ LEFT     CompressibilityPhasicitySpontaneityPropertiesThrombus Aging      +---------+---------------+---------+-----------+----------+-------------------+ CFV      Full           Yes      Yes                                      +---------+---------------+---------+-----------+----------+-------------------+ SFJ      Full                                                             +---------+---------------+---------+-----------+----------+-------------------+ FV Prox  Full                                                             +---------+---------------+---------+-----------+----------+-------------------+ FV Mid   Full                                                             +---------+---------------+---------+-----------+----------+-------------------+ FV DistalFull                                                             +---------+---------------+---------+-----------+----------+-------------------+  PFV      Full                                                             +---------+---------------+---------+-----------+----------+-------------------+ POP      Full           Yes      Yes                                      +---------+---------------+---------+-----------+----------+-------------------+ PTV                              Yes                  Not well visualized  +---------+---------------+---------+-----------+----------+-------------------+ PERO                             Yes                  Not well visualized +---------+---------------+---------+-----------+----------+-------------------+     Summary: RIGHT: - No evidence of common femoral vein obstruction.   LEFT: - There is no evidence of deep vein thrombosis in the lower extremity.  - No cystic structure found in the popliteal fossa.  *See table(s) above for measurements and observations. Electronically signed by Delaney Fearing on 07/18/2023 at 4:52:00 PM.    Final    DG Chest 1 View Result Date: 07/18/2023 CLINICAL DATA:  Endotracheal tube. EXAM: CHEST  1 VIEW COMPARISON:  Jul 17, 2023. FINDINGS: Endotracheal and feeding tube are in grossly good position. Right internal jugular catheter is unchanged. Mild bibasilar subsegmental atelectasis is noted with small pleural effusions. Degenerative changes are seen involving both shoulder joints. IMPRESSION: Support apparatus as noted above. Bibasilar atelectasis and probable small pleural effusions are noted. Electronically Signed   By: Rosalene Colon M.D.   On: 07/18/2023 15:58   DG FL GUIDED LUMBAR PUNCTURE Result Date: 07/18/2023 CLINICAL DATA:  Metabolic encephalopathy EXAM: DIAGNOSTIC LUMBAR PUNCTURE UNDER FLUOROSCOPIC GUIDANCE COMPARISON:  I assessed the brain imaging of 07/17/2023 and the CT abdomen of 07/08/2023 in preparation for this exam. FLUOROSCOPY: Radiation Exposure Index (as provided by the fluoroscopic device): 3.4 mGy Kerma PROCEDURE: The risks (including hemorrhage, infection, and nerve damage, among others), benefits, and alternatives to fluoroscopically guided lumbar puncture were discussed with the patient's wife by telephone, as the patient is unable to consent and has altered mental status. The patient's wife understood and elected for the patient to undergo the procedure. Standard time-out was employed. Following sterile skin prep and  local anesthetic administration consisting of 1 percent lidocaine , a 22 gauge spinal needle was advanced without difficulty into the thecal sac at the L2-3 level under fluoroscopic guidance. Clear CSF was returned. Because the patient is intubated, I did not turn him onto his side to obtain a pressure measurement. A total of 11.5 cc of clear CSF was collected in 4 vials. The needle was subsequently removed and the skin cleansed and bandaged. No immediate complications were observed. IMPRESSION: Technically successful fluoroscopically guided lumbar puncture at the L2-3 level. Electronically Signed   By: Freida Jes  M.D.   On: 07/18/2023 13:39   MR BRAIN WO CONTRAST Result Date: 07/18/2023 CLINICAL DATA:  Delirium EXAM: MRI HEAD WITHOUT CONTRAST TECHNIQUE: Multiplanar, multiecho pulse sequences of the brain and surrounding structures were obtained without intravenous contrast. COMPARISON:  CT head 07/17/2023. FINDINGS: Brain: No acute infarct. Punctate susceptibility in the left corona radiata suggestive of chronic microhemorrhage. No additional foci of hemorrhage appreciated. Scattered and confluent T2/FLAIR hyperintensity in the periventricular and subcortical white matter. No mass lesion or midline shift. Cerebellum is unremarkable. Normal appearance of midline structures. The basilar cisterns are patent. No extra-axial fluid collections. Ventricles: Normal size and configuration of the ventricles. Vascular: Skull base flow voids are visualized. Skull and upper cervical spine: Facet arthrosis in the visualized upper cervical spine most pronounced on the right at C3-4. Redemonstration of multiple calvarial lesions corresponding to lytic lesions on CT. Sinuses/Orbits: The orbits are symmetric. Minimal mucosal thickening in the alveolar recess of the right maxillary sinus. There is a 1.3 x 1.0 x 1.5 cm circumscribed cystic appearing lesion in the subcutaneous tissues overlying the nasal bones slightly right  of midline which demonstrates internal restricted diffusion. No evidence of connection to the intracranial compartment. Other: Large right mastoid effusion. Trace fluid in the left mastoid air cells. Fluid within the nasopharynx. Partially visualized enteric tube and endotracheal tube. IMPRESSION: No acute intracranial abnormality. Nonspecific white matter signal abnormality likely reflecting moderate chronic microvascular ischemic changes. Multiple calvarial lesions corresponding to lytic lesions on CT. Findings concerning for multiple myeloma versus metastasis. 1.5 cm cystic lesion in the subcutaneous tissues overlying the nasal bones likely reflecting epidermoid cyst versus dermoid. No evidence of connection to the intracranial compartment. Large right mastoid effusion. Electronically Signed   By: Denny Flack M.D.   On: 07/18/2023 12:54   CT HEAD WO CONTRAST ( ) Result Date: 07/17/2023 CLINICAL DATA:  58 year old male altered mental status. Respiratory failure. "Myeloma". EXAM: CT HEAD WITHOUT CONTRAST TECHNIQUE: Contiguous axial images were obtained from the base of the skull through the vertex without intravenous contrast. RADIATION DOSE REDUCTION: This exam was performed according to the departmental dose-optimization program which includes automated exposure control, adjustment of the mA and/or kV according to patient size and/or use of iterative reconstruction technique. COMPARISON:  No prior head CT.  CT Abdomen and Pelvis 07/08/2023. FINDINGS: Brain: Cerebral volume is within normal limits for age. No midline shift, ventriculomegaly, mass effect, evidence of mass lesion, intracranial hemorrhage or evidence of cortically based acute infarction. Gray-white differentiation maintained, mild to moderate for age scattered cerebral white matter hypodensity. Vascular: Calcified atherosclerosis at the skull base. No suspicious intracranial vascular hyperdensity. Skull: Scattered nonspecific round an oval  lucent areas in the bilateral calvarium. No pathologic fracture identified. Sinuses/Orbits: Intubated. Right nasoenteric tube in place. Relatively well aerated paranasal sinuses, partial opacification of the right middle ear and mastoids. Other: Visualized orbits and scalp soft tissues are within normal limits. IMPRESSION: 1. No acute intracranial abnormality. Mild to moderate for age cerebral white matter changes most commonly due to small vessel disease. 2. Scattered lytic lesions in the skull, similar to the skeletal findings on 07/08/2023, and highly suspicious for Multiple Myeloma versus lytic metastases. 3. Intubated. Electronically Signed   By: Marlise Simpers M.D.   On: 07/17/2023 14:37   DG CHEST PORT 1 VIEW Result Date: 07/17/2023 CLINICAL DATA:  Acute respiratory failure.  Hypoxia. EXAM: PORTABLE CHEST 1 VIEW COMPARISON:  07/13/2023 FINDINGS: A feeding tube passes into the stomach although the distal tip position is not  included on the film. Right IJ central line tip overlies the proximal SVC level. Low volume film with interval development of vascular congestion basilar predominant interstitial opacity, concerning for edema. Probable small left effusion. Cardiopericardial silhouette is at upper limits of normal for size. Degenerative changes noted in both shoulders. Telemetry leads overlie the chest. IMPRESSION: Low volume film with interval development of vascular congestion and basilar predominant interstitial opacity, concerning for edema. Probable small left effusion. Electronically Signed   By: Donnal Fusi M.D.   On: 07/17/2023 07:41   DG Chest Port 1 View Result Date: 07/13/2023 CLINICAL DATA:  Respiratory failure EXAM: PORTABLE CHEST 1 VIEW COMPARISON:  07/09/2023 FINDINGS: Support apparatus: Endotracheal tube terminates 5.9 cm above carina. Right IJ Cordis sheath unchanged. Feeding tube extends beyond the inferior aspect of the film. Heart/mediastinum: Midline trachea.  Normal heart size. Pleura:  Possible trace left pleural fluid.  No pneumothorax. Lungs: Persistent left and increased right base airspace disease. Other: Right glenohumeral joint degenerative change. IMPRESSION: Similar left and increased right base airspace disease, favoring atelectasis. Possible trace left pleural fluid. Electronically Signed   By: Lore Rode M.D.   On: 07/13/2023 09:39   EEG adult Result Date: 07/11/2023 Arleene Lack, MD     07/11/2023  3:28 PM Patient Name: Kevork Bownds. MRN: [id number] Epilepsy Attending: Arleene Lack Referring Physician/Provider: Casimiro Cleaves, PA-C Date: 07/11/2023 Duration: 24.55 mins Patient history: 58yo M with ams. EEG to evaluate for seizure Level of alertness:  comatose/ lethargic AEDs during EEG study: VPA Technical aspects: This EEG study was done with scalp electrodes positioned according to the 10-20 International system of electrode placement. Electrical activity was reviewed with band pass filter of 1-70Hz , sensitivity of 7 uV/mm, display speed of 45mm/sec with a 60Hz  notched filter applied as appropriate. EEG data were recorded continuously and digitally stored.  Video monitoring was available and reviewed as appropriate. Description: EEG showed continuous generalized 3-5 hz theta-delta slowing, at times with triphasic morphology.  Hyperventilation and photic stimulation were not performed.   ABNORMALITY - Continuous slow, generalized IMPRESSION: This study is suggestive of moderate to severe encephalopathy, could be secondary to toxic metabolic causes. No seizures or epileptiform discharges were seen throughout the recording. Arleene Lack   DG Chest 1 View Result Date: 07/09/2023 CLINICAL DATA:  Status post central line placement EXAM: PORTABLE CHEST 1 VIEW COMPARISON:  07/08/2023 FINDINGS: Endotracheal tube and feeding catheter are noted in satisfactory position. New right jugular catheter is seen in satisfactory position. No pneumothorax is noted. The overall  inspiratory effort is poor with crowding of the vascular markings. IMPRESSION: Poor inspiratory effort. No pneumothorax following central line placement. Electronically Signed   By: Violeta Grey M.D.   On: 07/09/2023 11:19   CT ABDOMEN PELVIS WO CONTRAST Result Date: 07/08/2023 CLINICAL DATA:  Palpable right groin mass. Anemia requiring 3 units of packed red blood cells. Sepsis due to acquired pneumonia. Lytic bone lesions with a clinical concern for a new diagnosis of multiple myeloma. Type 2 diabetes. EXAM: CT ABDOMEN AND PELVIS WITHOUT CONTRAST TECHNIQUE: Multidetector CT imaging of the abdomen and pelvis was performed following the standard protocol without IV contrast. RADIATION DOSE REDUCTION: This exam was performed according to the departmental dose-optimization program which includes automated exposure control, adjustment of the mA and/or kV according to patient size and/or use of iterative reconstruction technique. COMPARISON:  CT-guided right iliac bone marrow aspiration and core biopsy dated 07/04/2023. FINDINGS: Lower chest: Multifocal patchy opacities  in both lower lobes. Small left pleural effusion and minimal right pleural effusion. Borderline enlarged heart. Hepatobiliary: Normal-appearing liver. Dependent calcification in the gallbladder without gallbladder wall thickening or pericholecystic fluid. Pancreas: Unremarkable. No pancreatic ductal dilatation or surrounding inflammatory changes. Spleen: Normal in size without focal abnormality. Adrenals/Urinary Tract: Foley catheter in the urinary bladder with no significant urine in the bladder. Single tiny calculus in each kidney. Small, exophytic high density left renal cyst compatible with a hemorrhagic or proteinaceous cyst. Probable subcentimeter exophytic left renal cyst, difficult to assess due to streak artifacts produced by the patient's left arm. This could be assessed with renal ultrasound. Unremarkable adrenal glands and ureters.  Stomach/Bowel: Stomach is within normal limits. Appendix appears normal. No evidence of bowel wall thickening, distention, or inflammatory changes. Vascular/Lymphatic: Atheromatous arterial calcifications without aneurysm. No enlarged lymph nodes. Reproductive: Normal-sized prostate gland containing coarse calcifications. Other: Large right buttock and groin hematoma containing a fluid/hematocrit level. This measures 36.0 x 11.4 x 9.7 cm in maximum dimensions on sagittal and coronal images 32/7 and 39/6 respectively. Musculoskeletal: Left hip prosthesis with associated streak artifacts. Nondisplaced right 10th posterolateral rib fracture. Old, healed left posterolateral 10th rib fracture. Multiple small to medium-sized lytic lesions in multiple lumbar and lower thoracic vertebral bodies and in the pelvic bones bilaterally. Moderate right hip degenerative changes. IMPRESSION: 1. Large acute right buttock and groin hematoma measuring 36.0 x 11.4 x 9.7 cm. 2. Nondisplaced right 10th posterolateral rib fracture. 3. Multiple small to medium-sized lytic lesions in multiple lumbar and lower thoracic vertebral bodies and in the pelvic bones bilaterally, compatible with the clinical suspicion of multiple myeloma. Lytic metastases can also have this appearance. 4. Multifocal patchy opacities in both lower lobes, compatible with known pneumonia. 5. Small left pleural effusion and minimal right pleural effusion. 6. Cholelithiasis. 7. Tiny bilateral renal calculi. 8. Probable subcentimeter exophytic left renal cyst, difficult to assess due to streak artifacts produced by the patient's left arm. This could be assessed with renal ultrasound. Electronically Signed   By: Catherin Closs M.D.   On: 07/08/2023 16:07   DG CHEST PORT 1 VIEW Result Date: 07/08/2023 CLINICAL DATA:  [id number] Endotracheally intubated [id number] EXAM: PORTABLE CHEST 1 VIEW COMPARISON:  07/08/2023 FINDINGS: The endotracheal tube is positioned just to the left of  midline, likely within the mid trachea, likely due to patient rotation. Mild cardiomegaly. No focal airspace consolidation, pleural effusion, or pneumothorax. No cardiomegaly. No acute fracture or destructive lesion. Moderate osteoarthritis of the right shoulder. Multilevel degenerative disc disease of the spine. Weighted feeding tube courses below the diaphragm with the distal tip not included in the field of view. IMPRESSION: 1. The endotracheal tube is positioned just to the left of midline, likely within the mid trachea, but abnormally positioned due to patient rotation. No pneumothorax. 2. Weighted feeding tube courses below the diaphragm, distal tip outside the field of view. Electronically Signed   By: Rance Burrows M.D.   On: 07/08/2023 14:02   DG CHEST PORT 1 VIEW Result Date: 07/08/2023 CLINICAL DATA:  Dyspnea. EXAM: PORTABLE CHEST 1 VIEW COMPARISON:  07/06/2023 FINDINGS: Feeding tube extends down the esophagus into the stomach. Tube appears to be coiled in the stomach but the tip is not clearly identified. Heart size is within normal limits and stable. Prominent vascular and interstitial lung markings are unchanged. No focal airspace disease or lung consolidation. Again noted is severe joint space narrowing and probable degenerative changes in both shoulders. Trachea is midline. IMPRESSION: 1. Again  noted are prominent vascular and interstitial lung markings. Findings could represent vascular congestion or mild edema. Minimal change since 07/06/2023. 2. Feeding tube extends into the abdomen. Electronically Signed   By: Elene Griffes M.D.   On: 07/08/2023 10:25   DG Abd 1 View Result Date: 07/07/2023 CLINICAL DATA:  Nasogastric tube placement. EXAM: ABDOMEN - 1 VIEW COMPARISON:  None Available. FINDINGS: Weighted enteric tube tip in the left upper abdomen in the region of the mid or distal stomach. No bowel dilatation in the included upper abdomen. IMPRESSION: Weighted enteric tube tip in the mid or  distal stomach. Electronically Signed   By: Chadwick Colonel M.D.   On: 07/07/2023 17:08   DG Fluoro Rm 1-60 Min - No Report Result Date: 07/07/2023 CLINICAL DATA:  Acute encephalopathy, nasogastric tube placement requested. EXAM: FLUORO RM 1-60 MIN-NO REPORT FLUOROSCOPY: Fluoroscopy Time:  0 minutes 48 seconds Radiation Exposure Index (if provided by the fluoroscopic device): 7.0 mGy Number of Acquired Spot Images: 1 COMPARISON:  None Available. FINDINGS: Multiple attempts at nasogastric tube placement with a 20 French catheter resulted in right or left mainstem bronchus positioning. Patient was unable to swallow or follow directions to aid with insertion. Study was terminated. IMPRESSION: Unsuccessful nasogastric tube placement under fluoroscopy. Electronically Signed   By: Shearon Denis M.D.   On: 07/07/2023 14:13   ECHOCARDIOGRAM COMPLETE Result Date: 07/07/2023    ECHOCARDIOGRAM REPORT   Patient Name:   Clester Nole. Date of Exam: 07/07/2023 Medical Rec #:  [id number]          Height:       74.0 in Accession #:    [id number]         Weight:       236.6 lb Date of Birth:  1965-08-06          BSA:          2.334 m Patient Age:    58 years           BP:           141/88 mmHg Patient Gender: M                  HR:           110 bpm. Exam Location:  Inpatient Procedure: 3D Echo, Cardiac Doppler, Color Doppler and Intracardiac            Opacification Agent (Both Spectral and Color Flow Doppler were            utilized during procedure). Indications:    Atrial fibrillation  History:        Patient has no prior history of Echocardiogram examinations.  Sonographer:    Juanita Shaw Referring Phys: [id number] PARKER  Sonographer Comments: Image acquisition challenging due to patient body habitus and Image acquisition challenging due to respiratory motion. IMPRESSIONS  1. Left ventricular ejection fraction, by estimation, is 65 to 70%. The left ventricle has normal function. The left ventricle has no  regional wall motion abnormalities. Left ventricular diastolic parameters are indeterminate.  2. Right ventricular systolic function is normal. The right ventricular size is normal. There is normal pulmonary artery systolic pressure. The estimated right ventricular systolic pressure is 35.8 mmHg.  3. Left atrial size was moderately dilated.  4. The mitral valve is normal in structure. No evidence of mitral valve regurgitation. No evidence of mitral stenosis.  5. The aortic valve is tricuspid. Aortic valve regurgitation is not visualized. Aortic  valve sclerosis/calcification is present, without any evidence of aortic stenosis. Aortic valve mean gradient measures 8.0 mmHg.  6. Aortic dilatation noted. There is mild dilatation of the aortic root, measuring 39 mm.  7. The inferior vena cava is dilated in size with <50% respiratory variability, suggesting right atrial pressure of 15 mmHg.  8. The patient was in atrial fibrillation. FINDINGS  Left Ventricle: Left ventricular ejection fraction, by estimation, is 65 to 70%. The left ventricle has normal function. The left ventricle has no regional wall motion abnormalities. The left ventricular internal cavity size was normal in size. There is  no left ventricular hypertrophy. Left ventricular diastolic parameters are indeterminate. Right Ventricle: The right ventricular size is normal. No increase in right ventricular wall thickness. Right ventricular systolic function is normal. There is normal pulmonary artery systolic pressure. The tricuspid regurgitant velocity is 2.28 m/s, and  with an assumed right atrial pressure of 15 mmHg, the estimated right ventricular systolic pressure is 35.8 mmHg. Left Atrium: Left atrial size was moderately dilated. Right Atrium: Right atrial size was normal in size. Pericardium: There is no evidence of pericardial effusion. Mitral Valve: The mitral valve is normal in structure. No evidence of mitral valve regurgitation. No evidence of  mitral valve stenosis. MV peak gradient, 5.4 mmHg. The mean mitral valve gradient is 2.0 mmHg. Tricuspid Valve: The tricuspid valve is normal in structure. Tricuspid valve regurgitation is trivial. Aortic Valve: The aortic valve is tricuspid. Aortic valve regurgitation is not visualized. Aortic valve sclerosis/calcification is present, without any evidence of aortic stenosis. Aortic valve mean gradient measures 8.0 mmHg. Aortic valve peak gradient measures 16.0 mmHg. Aortic valve area, by VTI measures 2.25 cm. Pulmonic Valve: The pulmonic valve was normal in structure. Pulmonic valve regurgitation is not visualized. Aorta: Aortic dilatation noted. There is mild dilatation of the aortic root, measuring 39 mm. Venous: The inferior vena cava is dilated in size with less than 50% respiratory variability, suggesting right atrial pressure of 15 mmHg. IAS/Shunts: No atrial level shunt detected by color flow Doppler.  LEFT VENTRICLE PLAX 2D LVIDd:         5.00 cm      Diastology LVIDs:         3.60 cm      LV e' medial:    7.94 cm/s LV PW:         0.60 cm      LV E/e' medial:  17.0 LV IVS:        0.90 cm      LV e' lateral:   12.90 cm/s LVOT diam:     2.00 cm      LV E/e' lateral: 10.5 LV SV:         56 LV SV Index:   24 LVOT Area:     3.14 cm  LV Volumes (MOD) LV vol d, MOD A4C: 170.0 ml LV vol s, MOD A4C: 50.6 ml LV SV MOD A4C:     170.0 ml RIGHT VENTRICLE             IVC RV Basal diam:  4.20 cm     IVC diam: 2.50 cm RV Mid diam:    2.80 cm RV S prime:     22.40 cm/s TAPSE (M-mode): 2.0 cm LEFT ATRIUM              Index        RIGHT ATRIUM           Index LA diam:  4.20 cm  1.80 cm/m   RA Area:     17.80 cm LA Vol (A2C):   106.0 ml 45.41 ml/m  RA Volume:   43.80 ml  18.76 ml/m LA Vol (A4C):   86.7 ml  37.14 ml/m LA Biplane Vol: 101.0 ml 43.27 ml/m  AORTIC VALVE                     PULMONIC VALVE AV Area (Vmax):    2.04 cm      PV Vmax:       1.42 m/s AV Area (Vmean):   2.04 cm      PV Peak grad:  8.1 mmHg  AV Area (VTI):     2.25 cm AV Vmax:           200.00 cm/s AV Vmean:          125.000 cm/s AV VTI:            0.248 m AV Peak Grad:      16.0 mmHg AV Mean Grad:      8.0 mmHg LVOT Vmax:         130.00 cm/s LVOT Vmean:        81.200 cm/s LVOT VTI:          0.178 m LVOT/AV VTI ratio: 0.72  AORTA Ao Root diam: 3.90 cm Ao Asc diam:  3.30 cm MITRAL VALVE                TRICUSPID VALVE MV Area (PHT): 5.70 cm     TR Peak grad:   20.8 mmHg MV Area VTI:   2.31 cm     TR Vmax:        228.00 cm/s MV Peak grad:  5.4 mmHg MV Mean grad:  2.0 mmHg     SHUNTS MV Vmax:       1.16 m/s     Systemic VTI:  0.18 m MV Vmean:      65.4 cm/s    Systemic Diam: 2.00 cm MV Decel Time: 133 msec MV E velocity: 135.00 cm/s Dalton McleanMD Electronically signed by Archer Bear Signature Date/Time: 07/07/2023/9:53:50 AM    Final    DG CHEST PORT 1 VIEW Result Date: 07/06/2023 CLINICAL DATA:  Weakness altered EXAM: PORTABLE CHEST 1 VIEW COMPARISON:  07/05/2023 FINDINGS: Hypoventilatory changes. Cardiomegaly with mild central congestion. No consolidation, pleural effusion, or pneumothorax IMPRESSION: Cardiomegaly with mild central congestion. Electronically Signed   By: Esmeralda Hedge M.D.   On: 07/06/2023 18:17   DG CHEST PORT 1 VIEW Result Date: 07/05/2023 CLINICAL DATA:  Dyspnea EXAM: PORTABLE CHEST 1 VIEW COMPARISON:  07/04/2023 FINDINGS: Hypoventilatory change. Cardiomegaly with mild central congestion. No pleural effusion or pneumothorax. Advanced degenerative change of the shoulders. IMPRESSION: Cardiomegaly with mild central congestion. Electronically Signed   By: Esmeralda Hedge M.D.   On: 07/05/2023 16:06   DG CHEST PORT 1 VIEW Result Date: 07/04/2023 CLINICAL DATA:  Hypertension and dyspnea, generalized weakness, AKI EXAM: PORTABLE CHEST 1 VIEW COMPARISON:  None are available. There is a report from radiographs 12/14/2018 FINDINGS: Borderline cardiomegaly. Diffuse interstitial coarsening and airspace opacities bilaterally. No  pleural effusion or pneumothorax. No displaced rib fractures. Advanced arthritis both shoulders. IMPRESSION: Diffuse interstitial coarsening and airspace opacities bilaterally, likely edema. Atypical infection could appear similarly. Electronically Signed   By: Rozell Cornet M.D.   On: 07/04/2023 22:21   CT BONE MARROW BIOPSY & ASPIRATION Result Date: 07/04/2023 INDICATION: [id number] Myeloma (HCC) [id number]  EXAM: CT GUIDED BONE MARROW ASPIRATION AND CORE BIOPSY MEDICATIONS: None. ANESTHESIA/SEDATION: Moderate (conscious) sedation was employed during this procedure. A total of Versed  2 mg and Fentanyl  50 mcg was administered intravenously. Moderate Sedation Time: 16 minutes. The patient's level of consciousness and vital signs were monitored continuously by radiology nursing throughout the procedure under my direct supervision. FLUOROSCOPY TIME:  CT dose; 314 mGycm COMPLICATIONS: None immediate. Estimated blood loss: <5 mL PROCEDURE: RADIATION DOSE REDUCTION: This exam was performed according to the departmental dose-optimization program which includes automated exposure control, adjustment of the mA and/or kV according to patient size and/or use of iterative reconstruction technique. Informed written consent was obtained from the patient and/or patient's representative after a thorough discussion of the procedural risks, benefits and alternatives. All questions were addressed. Maximal Sterile Barrier Technique was utilized including caps, mask, sterile gowns, sterile gloves, sterile drape, hand hygiene and skin antiseptic. A timeout was performed prior to the initiation of the procedure. The patient was positioned prone and non-contrast localization CT was performed of the pelvis to demonstrate the iliac marrow spaces. Under sterile conditions and local anesthesia, an 11 gauge coaxial bone biopsy needle was advanced into the RIGHT iliac marrow space. Needle position was confirmed with CT imaging. Initially, bone  marrow aspiration was performed. Next, the 11 gauge outer cannula was utilized to obtain a 1 iliac bone marrow core biopsy. Needle was removed. Hemostasis was obtained with compression. The patient tolerated the procedure well. Samples were prepared with the cytotechnologist. IMPRESSION: Successful CT-guided bone marrow aspiration and biopsy. Art Largo, MD [facility] Radiology Electronically Signed   By: Art Largo M.D.   On: 07/04/2023 12:21     ADDENDUM: I saw and examined Mr. Harvest Lineman today.  I am a little surprised by the neutropenia.  Typically, with Cytoxan  and Velcade  you get very little in the way of low blood counts.  As such, I wonder what else might be going on.  He is on Neupogen .  Again, surprised that his white blood count keep dropping.  He is still quite anemic.  He is getting Procrit  every other day.  As far as you know, he is not bleeding.  His platelet count is adequate.    I have to believe that he is responding to treatment for the myeloma.  His protein level is down to 8.2.  His albumin  is still quite low.  His corrected calcium  is okay.  His creatinine is 1.79.  He continues on CRRT.  Again, I have to believe something else is going on here.  I am going to recheck his immunoglobulin levels.  It is possible may have to give him some IVIG.  He may have gotten a dose in the past.  This could certainly help improve his immunity.  I know that there is a lot more going on here.  We will continue to try to monitor this as much as possible.   [name], MD  Psalm 41:3

## 2023-07-21 NOTE — Progress Notes (Signed)
 [facility] NEPHROLOGY PROGRESS NOTE  Assessment/ Plan: Pt is a 58 y.o. yo male with past medical history significant for HTN, HLD, type II DM, BPH, seizure who was initially presented at [facility] due to AMS, admitted for sepsis due to pneumonia, encephalopathy, sepsis, AKI, hypercalcemia symptomatic anemia and Lytic pulm lesion concern for multiple myeloma.  # AKI on CKD 3a - b/l creat 1.1- 1.5 from 2024, eGFR 53- >60 ml/min. Creat here 5.8 on presentation at OSH. AKI due to possible myeloma kidney +/- hyperCa +/- ACEi (home med). Initially treated with IV fluid and then required Lasix  because of fluid overload. Started CRRT on 4/23. Pt remains anuric, and tolerating CRRT well.    # AMS/acute metabolic encephalopathy: per report improved; currently on sedation. MRI and LP so far negative.   #Volume - was wet and rx'd w/ IV lasix  then CRRT. Then was dry. Appearing euvolemic today - net even CRRT.   # Shock- remains on pressor support  # Multiple myeloma: Oncologist started Velcade  and Cytoxan , per oncology team.    # Neutropenia: post chemoRx, getting IV abx and filgrastim . Oncology following.   # Atrial fib-  on amio.  Current in NSR  #Hypercalcemia -  resolved  now.   #Multifocal PNA - suspected aspiration + MSSE pna. Getting IV meropenem .   #Anemia - prn transfusions.  # Hyperuricemia: s/p rasburicase , +dialysis.  # Hypophosphatemia: due to CRRT, remains low. IV Naphos prn  Subjective:  Seen in ICU remains on vent, sedated with vasopressor support As intended was net + 2.2L yest D/w RN = no issues with CRRT   Objective Vital signs in last 24 hours: Vitals:   07/21/23 1100 07/21/23 1115 07/21/23 1120 07/21/23 1130  BP: 136/67 (!) 86/49 (!) 87/49 (!) 93/53  Pulse: (!) 104     Resp: 15     Temp:      TempSrc:      SpO2: 96%     Weight:      Height:        Physical Exam: General: Critically ill looking male, intubated Looks better today,  less dry on exam Heart:RRR, s1s2 nl Lungs: Coarse breath sound bilateral Abdomen:soft, Non-tender, non-distended Extremities:No edema, L  area enlarged but not tense and no pitting edema - per staff on arrival had an orthopedic immobilizer device in place Neurology: not responsive, on vent   Adrian Alba MD [facility] Pager [phone]   Recent Labs  Lab 07/20/23 1656 07/21/23 0015 07/21/23 0540  HGB 7.1* 7.4* 7.0*  ALBUMIN  1.6*  --  <1.5*  CALCIUM  6.9*  --  7.3*  PHOS 3.0  --  2.4*  2.3*  CREATININE 2.35*  --  1.79*  K 4.8  --  5.2*    Inpatient medications:  sodium chloride    Intravenous Once   sodium chloride    Intravenous Once   amiodarone   200 mg Per NG tube BID   Followed by   [name] ON 07/24/2023] amiodarone   200 mg Per NG tube Daily   arformoterol  15 mcg Nebulization BID   busPIRone   15 mg Per Tube QHS   clonazePAM   1 mg Per Tube BID   docusate  100 mg Per Tube BID   epoetin  alfa  40,000 Units Subcutaneous QODAY   famotidine   10.4 mg Per Tube Daily   feeding supplement (PROSource TF20)  60 mL Per Tube TID   filgrastim  (NIVESTYM ) SQ  480 mcg Subcutaneous Daily   folic acid   1 mg Per Tube Daily   gabapentin   100 mg Per Tube Q12H   heparin  injection (subcutaneous)  5,000 Units Subcutaneous Q8H   insulin  aspart  0-20 Units Subcutaneous Q4H   insulin  aspart  8 Units Subcutaneous Q4H   insulin  glargine-yfgn  20 Units Subcutaneous BID   ipratropium-albuterol   3 mL Nebulization Q4H   lactulose   20 g Per Tube Daily   nicotine   7 mg Transdermal Daily   mouth rinse  15 mL Mouth Rinse Q2H   PARoxetine   40 mg Per Tube Daily   pneumococcal 20-valent conjugate vaccine  0.5 mL Intramuscular Tomorrow-1000   polyethylene glycol  17 g Per Tube Daily   QUEtiapine   50 mg Per Tube BID   sodium chloride  flush  10-40 mL Intracatheter Q12H   sodium chloride  HYPERTONIC  4 mL Nebulization Q4H   thiamine   100 mg Per Tube Daily    dexmedetomidine  (PRECEDEX ) IV  infusion Stopped (07/18/23 0945)   feeding supplement (VITAL 1.5 CAL) 60 mL/hr at 07/21/23 1100   fentaNYL  infusion INTRAVENOUS 125 mcg/hr (07/21/23 1100)   heparin  10,000 units/ 20 mL infusion syringe 750 Units/hr (07/21/23 0800)   meropenem  (MERREM ) IV Stopped (07/21/23 7829)   norepinephrine  (LEVOPHED ) Adult infusion 7 mcg/min (07/21/23 1100)   prismasol  BGK 4/2.5 400 mL/hr at 07/21/23 0004   prismasol  BGK 4/2.5 400 mL/hr at 07/21/23 0005   prismasol  BGK 4/2.5 1,500 mL/hr at 07/21/23 1008   propofol  (DIPRIVAN ) infusion Stopped (07/20/23 0841)   sodium PHOSPHATE  IVPB (in mmol) 43 mL/hr at 07/21/23 1100   vancomycin  Stopped (07/20/23 1508)   vasopressin  0.03 Units/min (07/21/23 1100)   acetaminophen  (TYLENOL ) oral liquid 160 mg/5 mL **OR** acetaminophen , fentaNYL , heparin , LORazepam , mouth rinse, oxyCODONE , polyethylene glycol, polyvinyl alcohol , sodium chloride  flush

## 2023-07-21 NOTE — Progress Notes (Signed)
    RN concerned about patient wheezing  On exam there is mild wheeze Patient already on scheduled nebs Runnig even on CRRT Will give 1 dose of lasix  -> if this does not work consider steroids     SIGNATURE    Dr. Maire Scot, M.D., F.C.C.P,  Pulmonary and Critical Care Medicine Staff Physician, [facility] Director - Interstitial Lung Disease  Program  Pulmonary Fibrosis [facility] at [facility] [place], [place], [address]   Pager: [phone], If no answer  -> Check AMION or Try [phone] Telephone (clinical office): [phone] Telephone (research): [phone]  6:15 PM 07/21/2023

## 2023-07-21 NOTE — Progress Notes (Signed)
 Nutrition Follow-up  DOCUMENTATION CODES:   Not applicable  INTERVENTION:  - Continue goal TF: Vital 1.5 at 60 ml/h (1440 ml per day) Prosource TF20 60 ml TID Provides 2400 kcal, 157 gm protein, 1100 ml free water  daily   - FWF per CCM/MD.    - Monitor weight trends.   NUTRITION DIAGNOSIS:   Inadequate oral intake related to inability to eat as evidenced by NPO status. *ongoing  GOAL:   Patient will meet greater than or equal to 90% of their needs *met with TF  MONITOR:   Vent status, Labs, Weight trends, TF tolerance  REASON FOR ASSESSMENT:   Consult Assessment of nutrition requirement/status  ASSESSMENT:   58 y.o. male with PMH of obesity, asthma, OSA on CPAP, GERD, HTN, HLD who presented due to feeling weak with AMS for a few days. Admitted with concern for sepsis, community-acquired pneumonia, encephalopathy as well as concern for multiple myeloma.  4/17 Admit 4/18 Regular textured diet 4/20 Cortrak attempted but unable to be placed 4/21 SLP eval - NPO; IR attempted Cortrak but unable to be placed; CCM MD successfully placed NGT; TF started 4/22 Concern for aspiration overnight and TF stopped; Intubated; TF restarted in the evening 4/23 CRRT initiated   Patient is currently intubated on ventilator support MV: 10.1 L/min Temp (24hrs), Avg:99.4 F (37.4 C), Min:97.6 F (36.4 C), Max:101.6 F (38.7 C)    Patient continues on goal tube feeds with no issues. Remains on CRRT.  Getting chemo for multiple myeloma. GOC discussions ongoing. Patient has been intubated almost 2 weeks. Palliative care following and plan for GOC meeting with family 5/7 at 4pm.      Admit weight: 243# Current weight: 231# I&O's: +11.6L since admit   Medications reviewed and include: Colace BID, Miralax , Lactulose , 1mg  folic acid , 100mg  thiamine  Fentanyl  Levo @ 7 mcg/min Vasopressin  @ 0.03 units/min   Labs reviewed:  Na 132 Creatinine 1.79 Phosphorus 2.4 Magnesium   2.6 HA1C 6.7 (as of 01/29/23) Blood Glucose 106-224 x24 hours     Diet Order:   Diet Order             Diet NPO time specified  Diet effective now                   EDUCATION NEEDS:  Not appropriate for education at this time  Skin:  Skin Assessment: Skin Integrity Issues: Skin Integrity Issues:: DTI DTI: Left Buttocks Unstageable: Left and Right Heel  Last BM:  5/5 - rectal tube  Height:  Ht Readings from Last 1 Encounters:  07/17/23 6' (1.829 m)   Weight:  Wt Readings from Last 1 Encounters:  07/20/23 105.2 kg   Ideal Body Weight:  86.36 kg  BMI:  Body mass index is 31.45 kg/m.  Estimated Nutritional Needs:  Kcal:  2350-2550 kcals Protein:  145-175 grams Fluid:  >/= 2.3L    Scheryl Cushing RD, LDN Contact via Secure Chat.

## 2023-07-22 ENCOUNTER — Inpatient Hospital Stay (HOSPITAL_COMMUNITY)

## 2023-07-22 DIAGNOSIS — D649 Anemia, unspecified: Secondary | ICD-10-CM

## 2023-07-22 DIAGNOSIS — C9000 Multiple myeloma not having achieved remission: Secondary | ICD-10-CM | POA: Diagnosis not present

## 2023-07-22 DIAGNOSIS — J90 Pleural effusion, not elsewhere classified: Secondary | ICD-10-CM | POA: Diagnosis not present

## 2023-07-22 DIAGNOSIS — G9340 Encephalopathy, unspecified: Secondary | ICD-10-CM | POA: Diagnosis not present

## 2023-07-22 DIAGNOSIS — R6521 Severe sepsis with septic shock: Secondary | ICD-10-CM | POA: Diagnosis not present

## 2023-07-22 DIAGNOSIS — J9601 Acute respiratory failure with hypoxia: Secondary | ICD-10-CM | POA: Diagnosis not present

## 2023-07-22 DIAGNOSIS — Z87891 Personal history of nicotine dependence: Secondary | ICD-10-CM | POA: Diagnosis not present

## 2023-07-22 DIAGNOSIS — A419 Sepsis, unspecified organism: Secondary | ICD-10-CM | POA: Diagnosis not present

## 2023-07-22 DIAGNOSIS — R531 Weakness: Secondary | ICD-10-CM | POA: Diagnosis not present

## 2023-07-22 DIAGNOSIS — J181 Lobar pneumonia, unspecified organism: Secondary | ICD-10-CM | POA: Diagnosis not present

## 2023-07-22 LAB — GLUCOSE, CAPILLARY
Glucose-Capillary: 128 mg/dL — ABNORMAL HIGH (ref 70–99)
Glucose-Capillary: 98 mg/dL (ref 70–99)
Glucose-Capillary: 144 mg/dL — ABNORMAL HIGH (ref 70–99)
Glucose-Capillary: 89 mg/dL (ref 70–99)
Glucose-Capillary: 88 mg/dL (ref 70–99)
Glucose-Capillary: 126 mg/dL — ABNORMAL HIGH (ref 70–99)

## 2023-07-22 LAB — RENAL FUNCTION PANEL
Albumin: 1.5 g/dL — ABNORMAL LOW (ref 3.5–5.0)
Albumin: <1.5 g/dL — ABNORMAL LOW (ref 3.5–5.0)
Anion gap: 6 (ref 5–15)
Anion gap: 5 (ref 5–15)
BUN: 37 mg/dL — ABNORMAL HIGH (ref 6–20)
BUN: 37 mg/dL — ABNORMAL HIGH (ref 6–20)
CO2: 25 mmol/L (ref 22–32)
CO2: 25 mmol/L (ref 22–32)
Calcium: 7.5 mg/dL — ABNORMAL LOW (ref 8.9–10.3)
Calcium: 7.4 mg/dL — ABNORMAL LOW (ref 8.9–10.3)
Chloride: 101 mmol/L (ref 98–111)
Chloride: 103 mmol/L (ref 98–111)
Creatinine, Ser: 1.65 mg/dL — ABNORMAL HIGH (ref 0.61–1.24)
Creatinine, Ser: 1.6 mg/dL — ABNORMAL HIGH (ref 0.61–1.24)
GFR, Estimated: 48 mL/min — ABNORMAL LOW (ref >60)
GFR, Estimated: 49 mL/min — ABNORMAL LOW (ref >60)
Glucose, Bld: 168 mg/dL — ABNORMAL HIGH (ref 70–99)
Glucose, Bld: 92 mg/dL (ref 70–99)
Phosphorus: 2.4 mg/dL — ABNORMAL LOW (ref 2.5–4.6)
Phosphorus: 1.9 mg/dL — ABNORMAL LOW (ref 2.5–4.6)
Potassium: 4.4 mmol/L (ref 3.5–5.1)
Potassium: 4.8 mmol/L (ref 3.5–5.1)
Sodium: 132 mmol/L — ABNORMAL LOW (ref 135–145)
Sodium: 133 mmol/L — ABNORMAL LOW (ref 135–145)

## 2023-07-22 LAB — CSF CULTURE W GRAM STAIN: Gram Stain: NONE SEEN

## 2023-07-22 LAB — TYPE AND SCREEN
ABO/RH(D): A POS
Antibody Screen: NEGATIVE
Unit division: 0

## 2023-07-22 LAB — BPAM RBC
Blood Product Expiration Date: 202505312359
ISSUE DATE / TIME: 202505051303
Unit Type and Rh: 6200

## 2023-07-22 LAB — APTT: aPTT: 34 s (ref 24–36)

## 2023-07-22 LAB — CBC
HCT: 27 % — ABNORMAL LOW (ref 39.0–52.0)
Hemoglobin: 8.2 g/dL — ABNORMAL LOW (ref 13.0–17.0)
MCH: 30.4 pg (ref 26.0–34.0)
MCHC: 30.4 g/dL (ref 30.0–36.0)
MCV: 100 fL (ref 80.0–100.0)
Platelets: 141 10*3/uL — ABNORMAL LOW (ref 150–400)
RBC: 2.7 MIL/uL — ABNORMAL LOW (ref 4.22–5.81)
RDW: 20.7 % — ABNORMAL HIGH (ref 11.5–15.5)
WBC: 2.3 10*3/uL — ABNORMAL LOW (ref 4.0–10.5)
nRBC: 20.4 % — ABNORMAL HIGH (ref 0.0–0.2)

## 2023-07-22 LAB — PNEUMOCYSTIS JIROVECI SMEAR BY DFA

## 2023-07-22 LAB — MAGNESIUM: Magnesium: 2.4 mg/dL (ref 1.7–2.4)

## 2023-07-22 LAB — PHOSPHORUS: Phosphorus: 2.7 mg/dL (ref 2.5–4.6)

## 2023-07-22 MED ORDER — INSULIN GLARGINE-YFGN 100 UNIT/ML ~~LOC~~ SOLN
5.0000 [IU] | Freq: Once | SUBCUTANEOUS | Status: AC
Start: 2023-07-22 — End: 2023-07-22
  Administered 2023-07-22: 5 [IU] via SUBCUTANEOUS
  Filled 2023-07-22: qty 0.05

## 2023-07-22 MED ORDER — MIDODRINE HCL 5 MG PO TABS
5.0000 mg | ORAL_TABLET | Freq: Three times a day (TID) | ORAL | Status: DC
  Administered 2023-07-22 – 2023-07-23 (×2): 5 mg
  Filled 2023-07-22 (×2): qty 1

## 2023-07-22 MED ORDER — REVEFENACIN 175 MCG/3ML IN SOLN
175.0000 ug | Freq: Every day | RESPIRATORY_TRACT | Status: DC
  Administered 2023-07-22 – 2023-09-17 (×49): 175 ug via RESPIRATORY_TRACT
  Filled 2023-07-22 (×57): qty 3

## 2023-07-22 MED ORDER — LEVOFLOXACIN IN D5W 750 MG/150ML IV SOLN
750.0000 mg | Freq: Once | INTRAVENOUS | Status: AC
  Administered 2023-07-22: 750 mg via INTRAVENOUS
  Filled 2023-07-22: qty 150

## 2023-07-22 MED ORDER — IMMUNE GLOBULIN (HUMAN) 10 GM/100ML IV SOLN
40.0000 g | Freq: Once | INTRAVENOUS | Status: AC
  Administered 2023-07-22: 40 g via INTRAVENOUS
  Filled 2023-07-22: qty 400

## 2023-07-22 MED ORDER — LEVOFLOXACIN IN D5W 500 MG/100ML IV SOLN: 500.0000 mg | INTRAVENOUS | Status: DC

## 2023-07-22 MED ORDER — ONDANSETRON HCL 4 MG/2ML IJ SOLN
4.0000 mg | Freq: Four times a day (QID) | INTRAMUSCULAR | Status: DC | PRN
  Administered 2023-07-22 – 2023-07-23 (×2): 4 mg via INTRAVENOUS
  Filled 2023-07-22 (×2): qty 2

## 2023-07-22 NOTE — Progress Notes (Signed)
 [facility]  Date of Admission:  07/03/2023       Lines: Ett 5/2-c Right internal jugular Hd cath 4/23-c  Left femoral triple lumen cvc 4/22-c   Abx: 4/17 Azith >> 4/20  4/17 CTX >> 4/22 4/18 Fluconazole  > 4/23 4/22 Zosyn  >> 5/1 4/28: Vanco>>5/05 5/1 Meropenem  >>  5/4 Voriconazole >>   Other: Dexamethasone  4/18-20 Methyl pred medrol  dose pack 3/20 for 6 days   ASSESSMENT/recs 58 yo male with left orif 2024/2024, hx hip and knee arthroplasty, tobacco use/copd, marijuanna use, ckd3, aggressive MM recent dx 06/2023, admitted 4/22 in transfer from [place] for 1 week generalized weakness found to have aki, acute on chronic anemia, confusion. Course complicated by hypoxemic respiratory failure, sepsis, pna   #neutropenic fever #hypox resp failure #pna #shock #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 Query colonizer.   Has been on prolonged iv abx piptazo --> vanc/meropenem ; respiratory cx shows normal flora 4/21 tte normal ef  Abx hasn't done much if anything with the fever curve  Asp fumigatus on sputum cx likely colonizer. Ct imaging and clinicallly not suggestive of infection  Pjp also low on ddx  Legionella if present hasn't been fully treated  5/6 discussed with pulm team again. Tumor lysis at onset but had quickly resolved. Crrt borderline temperature. Fio2 requirement has been really low and decreased to 30% today from 50% on 5/5. Aspirated last night but otherwise stable mild white sputum out of ett. Main issue is the cns encephalopathy. LP previously no cytology obtained (negative cx/pcr). Team has discussed plex with dr Bolling Bushy     -stop vanc/meropenem ; bcx ngtd; repeat mrsa nares pcr negative.  -am cortisol -levoflox x7 days planned -stop voriconazole -maintain standard isolation precaution     Micro: 5/5 trach aspirate for pjp smear in progress 5/5 bcx in progress 5/4  resp cx few gnr, few yeaste, rare gpc 5/2 csf pcr negative; csf culture negative 4/30 resp cx rare aspergillus fumigatus 4/28 bcx ngtd 4/23 tracheal aspirate mrse 4/19 bcx ngtd 4/19 ucx negative 4/19 mrsa nares negative    #ams 5/2 lp normal glucose, protein, and only 1 wbc Suspect metabolic. 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 Ammonia level had normalized by 07/17/23  -will defer to pulm ccm and oncology regarding plex. From id standpoint will not affect dx or tx plan    #aki on ckd Shock, MM related and ace-I at home Crrt started here  5/6 remains on stable low dose pressors in setting crrt  -am cortisol   #mm #tumor lysis #hypercalcemia Aggressive cytogenetics  Chemo 4/22 as mentioned -- high uric acid within 24 hours chemo but quickly decreased/normalized Rasburicase  preemptive prophy previouslys tarted Onc following     Principal Problem:   Generalized weakness Active Problems:   Multiple myeloma without remission (HCC)   Atrial fibrillation with rapid ventricular response (HCC)   AKI (acute kidney injury) (HCC)   Acute respiratory failure with hypoxia (HCC)   PAF (paroxysmal atrial fibrillation) (HCC)   Acute encephalopathy   Severe sepsis with septic shock (HCC)   On mechanically assisted ventilation (HCC)   Acute metabolic encephalopathy   Lobar pneumonia, unspecified organism (HCC)   Allergies  Allergen Reactions   Morphine And Codeine Anaphylaxis   Shellfish Allergy Anaphylaxis   Betadine  [Povidone Iodine ] Itching   Bupropion Other (See Comments)    Shaking of the body,  hallucinations    Chlorhexidine  Hives    Patient reports never had issues CHG with mouth rinse.   Influenza Vaccines Hives   Metoprolol  Rash    Scheduled Meds:  amiodarone   200 mg Per NG tube Daily   arformoterol  15 mcg Nebulization BID   busPIRone   15 mg Per Tube QHS   clonazePAM   1 mg Per Tube BID   docusate  100 mg  Per Tube BID   epoetin  alfa  40,000 Units Subcutaneous QODAY   famotidine   10.4 mg Per Tube Daily   feeding supplement (PROSource TF20)  60 mL Per Tube TID   filgrastim  (NIVESTYM ) SQ  480 mcg Subcutaneous Daily   folic acid   1 mg Per Tube Daily   gabapentin   100 mg Per Tube Q12H   heparin  injection (subcutaneous)  5,000 Units Subcutaneous Q8H   insulin  aspart  0-20 Units Subcutaneous Q4H   insulin  aspart  8 Units Subcutaneous Q4H   insulin  glargine-yfgn  20 Units Subcutaneous BID   ipratropium-albuterol   3 mL Nebulization Q4H   lactulose   20 g Per Tube Daily   nicotine   7 mg Transdermal Daily   mouth rinse  15 mL Mouth Rinse Q2H   PARoxetine   40 mg Per Tube Daily   pneumococcal 20-valent conjugate vaccine  0.5 mL Intramuscular Tomorrow-1000   polyethylene glycol  17 g Per Tube Daily   QUEtiapine   50 mg Per Tube BID   revefenacin  175 mcg Nebulization Daily   sodium chloride  flush  10-40 mL Intracatheter Q12H   sodium chloride  HYPERTONIC  4 mL Nebulization Q4H   thiamine   100 mg Per Tube Daily   Continuous Infusions:  dexmedetomidine  (PRECEDEX ) IV infusion 1.1 mcg/kg/hr (07/22/23 1444)   feeding supplement (VITAL 1.5 CAL) Stopped (07/22/23 1101)   fentaNYL  infusion INTRAVENOUS 200 mcg/hr (07/22/23 1500)   heparin  10,000 units/ 20 mL infusion syringe 1,000 Units/hr (07/22/23 1512)   levofloxacin (LEVAQUIN) IV 100 mL/hr at 07/22/23 1500   Followed by   [name] ON 07/23/2023] levofloxacin (LEVAQUIN) IV     norepinephrine  (LEVOPHED ) Adult infusion 6 mcg/min (07/22/23 1500)   prismasol  BGK 4/2.5 400 mL/hr at 07/22/23 0131   prismasol  BGK 4/2.5 400 mL/hr at 07/22/23 0133   prismasol  BGK 4/2.5 1,500 mL/hr at 07/22/23 1322   vasopressin  0.03 Units/min (07/22/23 1500)   PRN Meds:.acetaminophen  (TYLENOL ) oral liquid 160 mg/5 mL **OR** acetaminophen , fentaNYL , heparin , ondansetron  (ZOFRAN ) IV, mouth rinse, oxyCODONE , polyethylene glycol, polyvinyl alcohol , sodium chloride   flush   SUBJECTIVE: Ongoing fever shock Remains on crrt No rash  Review of Systems: ROS All other ROS was negative, except mentioned above     OBJECTIVE: Vitals:   07/22/23 1107 07/22/23 1126 07/22/23 1145 07/22/23 1152  BP: 107/62 123/68 (!) 104/55   Pulse: 89 79 88 83  Resp: 20 (!) 36 (!) 21 18  Temp: (!) 100.6 F (38.1 C) (!) 100.6 F (38.1 C) (!) 100.6 F (38.1 C) (!) 100.6 F (38.1 C)  TempSrc:      SpO2: 95% 95% 96% 94%  Weight:      Height:       Body mass index is 31.39 kg/m.  Physical Exam General/constitutional: intubated/sedated on pressors, crrt; ill appearing HEENT: Normocephalic, PER, Conj Clear CV: rrr no mrg Lungs: on vent 30% fio2 Abd: Soft, Nontender Ext: generalized edema Skin: No Rash Neuro: sedated MSK: no peripheral joint swelling/tenderness/warmth   Central line presence: cvc right internal jugular and groin cvc no bleeding/purulence   Lab  Results Lab Results  Component Value Date   WBC 2.3 (L) 07/22/2023   HGB 8.2 (L) 07/22/2023   HCT 27.0 (L) 07/22/2023   MCV 100.0 07/22/2023   PLT 141 (L) 07/22/2023    Lab Results  Component Value Date   CREATININE 1.65 (H) 07/22/2023   BUN 37 (H) 07/22/2023   NA 132 (L) 07/22/2023   K 4.4 07/22/2023   CL 101 07/22/2023   CO2 25 07/22/2023    Lab Results  Component Value Date   ALT 24 07/20/2023   AST 9 (L) 07/20/2023   ALKPHOS 39 07/20/2023   BILITOT 0.6 07/20/2023      Microbiology: Recent Results (from the past 240 hours)  Culture, blood (Routine X 2) w Reflex to ID Panel     Status: None   Collection Time: 07/14/23  9:10 PM   Specimen: BLOOD RIGHT HAND  Result Value Ref Range Status   Specimen Description   Final    BLOOD RIGHT HAND Performed at [facility], [address], [place], [place] [address]    Special Requests   Final    BOTTLES DRAWN AEROBIC ONLY Blood Culture results may not be optimal due to an inadequate volume of blood received in culture  bottles Performed at [facility], [address], [place], [place] [address]    Culture   Final    NO GROWTH 5 DAYS Performed at [facility], [address], [place], [place] [address]    Report Status 07/19/2023 FINAL  Final  Culture, blood (Routine X 2) w Reflex to ID Panel     Status: None   Collection Time: 07/14/23  9:10 PM   Specimen: BLOOD  Result Value Ref Range Status   Specimen Description   Final    BLOOD LEFT ANTECUBITAL Performed at [facility], [address] [place], [place] [address]    Special Requests   Final    BOTTLES DRAWN AEROBIC AND ANAEROBIC Blood Culture adequate volume Performed at [facility], [address] [place], [place] [address]    Culture   Final    NO GROWTH 5 DAYS Performed at [facility], [address] [place], [place] [address]    Report Status 07/19/2023 FINAL  Final  Culture, Respiratory w Gram Stain     Status: None   Collection Time: 07/16/23  2:54 PM   Specimen: Tracheal Aspirate; Respiratory  Result Value Ref Range Status   Specimen Description   Final    TRACHEAL ASPIRATE Performed at [facility], [address], [place], [place] [address]    Special Requests   Final    NONE Performed at [facility], [address], [place], [place] [address]    Gram Stain   Final    FEW WBC PRESENT, PREDOMINANTLY PMN RARE BUDDING YEAST SEEN ABUNDANT GRAM POSITIVE COCCI IN PAIRS Performed at [facility], [address] [place], [place] [address]    Culture RARE ASPERGILLUS FUMIGATUS  Final   Report Status 07/19/2023 FINAL  Final  CSF culture w Gram Stain     Status: None   Collection Time: 07/18/23  1:32 PM   Specimen: PATH Cytology CSF; Cerebrospinal Fluid  Result Value Ref Range Status   Specimen Description   Final    CSF Performed at [facility], [address] [place], [place] [address]     Special Requests  Final    Immunocompromised Performed at [facility], [address], [place], [place] [address]    Gram Stain   Final    NO ORGANISMS SEEN RARE WBC PRESENT, PREDOMINANTLY PMN Gram Stain Report Called to,Read Back By and Verified With: RN H [facility] AT 1443 07/18/23 CRUICKSHANK A GRAM STAIN REVIEWED-AGREE WITH RESULT DRT    Culture   Final    NO GROWTH 3 DAYS Performed at [facility], [address] [place], [place] [address]    Report Status 07/22/2023 FINAL  Final  Culture, Respiratory w Gram Stain     Status: None   Collection Time: 07/20/23 11:38 AM   Specimen: Tracheal Aspirate; Respiratory  Result Value Ref Range Status   Specimen Description   Final    TRACHEAL ASPIRATE Performed at [facility], [address] [place], [place] [address]    Special Requests   Final    NONE Performed at [facility], [address], [place], [place] [address]    Gram Stain   Final    ABUNDANT WBC PRESENT, PREDOMINANTLY PMN FEW SQUAMOUS EPITHELIAL CELLS PRESENT FEW GRAM NEGATIVE RODS FEW YEAST RARE GRAM POSITIVE COCCI    Culture   Final    RARE MOLD CALL MICRO IF IDENTIFICATION REQUIRED Performed at [facility], [address], [place], [place] [address]    Report Status 07/22/2023 FINAL  Final  Culture, blood (Routine X 2) w Reflex to ID Panel     Status: None (Preliminary result)   Collection Time: 07/21/23  1:27 AM   Specimen: BLOOD  Result Value Ref Range Status   Specimen Description   Final    BLOOD BLOOD LEFT HAND Performed at [facility], [address], [place], [place] [address]    Special Requests   Final    BOTTLES DRAWN AEROBIC ONLY Blood Culture results may not be optimal due to an inadequate volume of blood received in culture bottles Performed at [facility], [address] [place], [place] [address]    Culture   Final    NO  GROWTH 1 DAY Performed at [facility], [address], [place], [place] [address]    Report Status PENDING  Incomplete  Culture, blood (Routine X 2) w Reflex to ID Panel     Status: None (Preliminary result)   Collection Time: 07/21/23  1:27 AM   Specimen: BLOOD  Result Value Ref Range Status   Specimen Description   Final    BLOOD BLOOD LEFT HAND Performed at [facility], [address] [place], [place] [address]    Special Requests   Final    BOTTLES DRAWN AEROBIC ONLY Blood Culture results may not be optimal due to an inadequate volume of blood received in culture bottles Performed at [facility], [address] [place] [address] [place], [place] [address]    Culture   Final    NO GROWTH 1 DAY Performed at [facility], [address] [place], [place] [address]    Report Status PENDING  Incomplete  MRSA Next Gen by PCR, Nasal     Status: None   Collection Time: 07/21/23  8:08 AM   Specimen: Nasal Mucosa; Nasal Swab  Result Value Ref Range Status   MRSA by PCR Next Gen NOT DETECTED NOT DETECTED Final    Comment: (NOTE) The GeneXpert MRSA Assay (FDA approved for NASAL specimens only),  is one component of a comprehensive MRSA colonization surveillance program. It is not intended to diagnose MRSA infection nor to guide or monitor treatment for MRSA infections. Test performance is not FDA approved in patients less than 29 years old. Performed at [facility], [address] [place], [place] [address]      Serology:   Imaging: If present, new imagings (plain films, ct scans, and mri) have been personally visualized and interpreted; radiology reports have been reviewed. Decision making incorporated into the Impression / Recommendations.  5/2 dvt duplex bilateral lower ext, negative  5/4 chest ct noncontrast 1. Bilateral lower lobe airspace consolidation compatible with pneumonia. 2. Multifocal patchy airspace areas  and ill-defined ground-glass slightly nodular opacities throughout the bilateral upper lobes and minimally in the right middle lobe, also likely infectious/inflammatory. 3. Small bilateral pleural effusions. 4. Mild cardiomegaly. 5. Cholelithiasis. 6. Scattered lucent lesions throughout the thoracic spine measuring up to 8 mm, nonspecific. Findings may be related to multiple myeloma or metastatic disease. 7. Minimal compression deformity of the superior endplate of T11.  5/2 mri brain No acute intracranial abnormality.   Nonspecific white matter signal abnormality likely reflecting moderate chronic microvascular ischemic changes.   Multiple calvarial lesions corresponding to lytic lesions on CT. Findings concerning for multiple myeloma versus metastasis.   1.5 cm cystic lesion in the subcutaneous tissues overlying the nasal bones likely reflecting epidermoid cyst versus dermoid. No evidence of connection to the intracranial compartment.   Large right mastoid effusion.    4/21 tte  1. Left ventricular ejection fraction, by estimation, is 65 to 70%. The  left ventricle has normal function. The left ventricle has no regional  wall motion abnormalities. Left ventricular diastolic parameters are  indeterminate.   2. Right ventricular systolic function is normal. The right ventricular  size is normal. There is normal pulmonary artery systolic pressure. The  estimated right ventricular systolic pressure is 35.8 mmHg.   3. Left atrial size was moderately dilated.   4. The mitral valve is normal in structure. No evidence of mitral valve  regurgitation. No evidence of mitral stenosis.   5. The aortic valve is tricuspid. Aortic valve regurgitation is not  visualized. Aortic valve sclerosis/calcification is present, without any  evidence of aortic stenosis. Aortic valve mean gradient measures 8.0 mmHg.   6. Aortic dilatation noted. There is mild dilatation of the aortic root,   measuring 39 mm.   7. The inferior vena cava is dilated in size with <50% respiratory  variability, suggesting right atrial pressure of 15 mmHg.   8. The patient was in atrial fibrillation.    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   Jamesetta Mcbride, MD [facility] [phone] pager    07/22/2023, 3:17 PM

## 2023-07-22 NOTE — Progress Notes (Signed)
 eLink Physician-Brief Progress Note Patient Name: Frank [name]. DOB: [birth year] MRN: [id number]   Date of Service  07/22/2023  HPI/Events of Note  Patient needs wrist restraints order renewed to prevent self-extubation.  eICU Interventions  Restraints order renewed.        Shalona Harbour U Dafna Romo 07/22/2023, 7:58 PM

## 2023-07-22 NOTE — Progress Notes (Signed)
 Overall, his status really is changed.  He is still is on the vent.  He is still sedated.  He is still not making urine.  He is on CRRT.  His white cell count is not back yet.  Blood count 141,000.  Hemoglobin 8.2.  He did have a chest CT scan on 07/20/2023.  This showed bilateral lower lobe airspace consolidation compatible with pneumonia.  He had multifocal patchy airspace areas.  He had mild cardiomegaly.  He had a the lucent lesions in the bones compatible with the myeloma.  I did send off myeloma studies on him.  Again, will be done so far has had of help since his protein levels come down so well.  Again he has a lot of health issues.  It is hard to figure out what might be prominent.  He is in normal sinus rhythm on monitor.  I am wondering if the amiodarone  can be tapered down a little bit.  This might be affecting his blood counts.  He is on Neupogen .  Cultures are all negative to date.  He is on broad-spectrum antibiotic and also antifungal coverage.  Maybe, we should give him a dose of IVIG.  This could certainly help his immune system.   He was due for chemotherapy this week.  We are going to hold chemotherapy right now.  Again, he has a lot of other issues ongoing.   [name], MD  [place] [phone]

## 2023-07-22 NOTE — Progress Notes (Signed)
 Pharmacy Antibiotic Note  Frank [name]. is a 58 y.o. male admitted on 07/03/2023 with hypoxic respiratory failure/PNA.  Pharmacy has been consulted for Levaquin dosing.   Decision was made to adjust antibiotics to add legionella coverage and continue with gram negative coverage. Aspergillus thought to be nonpathogenic at this time.   Plan: - Start Levaquin 750 mg IV x 1 followed by 500 mg IV every 24 hours - while on CRRT - Will continue to follow CRRT tolerance, culture results, LOT, and antibiotic de-escalation plans   Height: 6' 0.01" (182.9 cm) Weight: 105 kg (231 lb 7.7 oz) IBW/kg (Calculated) : 77.62  Temp (24hrs), Avg:99 F (37.2 C), Min:96.9 F (36.1 C), Max:100.6 F (38.1 C)  Recent Labs  Lab 07/20/23 0530 07/20/23 1656 07/21/23 0015 07/21/23 0540 07/21/23 1700 07/22/23 0456 07/22/23 0457  WBC 0.6* 0.4* 1.1* 0.7*  --   --  2.3*  CREATININE 2.25* 2.35*  --  1.79* 1.74* 1.65*  --     Estimated Creatinine Clearance: 60.4 mL/min (A) (by C-G formula based on SCr of 1.65 mg/dL (H)).    Allergies  Allergen Reactions   Morphine And Codeine Anaphylaxis   Shellfish Allergy Anaphylaxis   Betadine  [Povidone Iodine ] Itching   Bupropion Other (See Comments)    Shaking of the body, hallucinations    Chlorhexidine  Hives    Patient reports never had issues CHG with mouth rinse.   Influenza Vaccines Hives   Metoprolol  Rash    Antimicrobials this admission: Azithromycin  4/17 x 1; then 4/19 >> 4/20 CRO 4/17 >> 4/22 Fluconazole  4/18 >> 4/22 Zosyn  4/22 >> 5/1 Vancomycin  4/28 >> 5/6 Meropenem  5/1 >>  5/6 Voriconazole 5/4 >> 5/6 Levofloxacin 5/6 >>  Dose adjustments this admission: N/a  Microbiology results: 4/19 MRSA PCR >> neg 4/19 UCx >> NGf 4/19 BCx >> NGf 4/23 RCx (TA) >> few MRSE 4/28 BCx >> NGf 4/30 RCx (TA) >> rare aspergillus fumigatus 5/2 CSF cx >> ngF 5/4 RCx (TA) >> rare mold 5/5 BCx >> ngx1d  Thank you for allowing pharmacy to be a part of this  patient's care.  Garland Junk, PharmD, BCPS, BCIDP Infectious Diseases Clinical Pharmacist 07/22/2023 2:31 PM   **Pharmacist phone directory can now be found on [email or web address] ([email or web address]).  Listed under [facility].

## 2023-07-22 NOTE — Progress Notes (Signed)
 CPT on hold due to pt getting dressing change.

## 2023-07-22 NOTE — Progress Notes (Addendum)
 No further emesis events.   -Resuming EN, trickle for a few hours then can adv q2 hrs overnight as tolerated until at goal  -Hold EN insulin  coverage (8 units q4hr) until EN is back at goal  -Adding midodrine   -talked w ID about case. I posed idea of revisiting PLEX (had been discussed a few weeks ago prior/amidst his decompensation requiring intubation) as we seem to be somewhat stagnant (persistent AMS, renal failure, shocky, low grade temps) & am not sure if it might provide some benefit from a clinical standpoint -- but isn't something I have great experience with in this situation. Serum viscosities were a little elevated a few weeks ago.  From ID standpoint, sounds like plex would not interfere/hinder tx.  In d/w nephrology, sounds like for MM plex isn't as common anymore because of chemo efficacy.  Further chemo has been on hold w so many active critical illness components.    Will d/w Dr. Maria Shiner. In scheme of things seems fairly low risk, just not sure if it has reasonable potential benefit in this situation.    Addl CCT 47 min   ______________  Talked w wife michelle. Discussed clinical course, plan of care. Discussed trach as he has now been intubated for 2 weeks and while his vent settings are fairly minimal we are not nearing extubation readiness.   She would like to go forward with trach whenever feasible. Also articulated desire for PEG -- I think we can maintain cortrak for now and defer PEG in the immediate sense but ultimately may be needed.    -Pt is not on therapeutic AC (ppx heparin  and heparin  via crrt only) and not on antiplt.  -Minimal vent settings-- FiO2 30% PEEP 5   Will d/w proceduralists over at [facility] and see what availability is like for trach.   Plan for trach 07/24/23  Will place NPO orders and trach orders tomorrow for 5/8.     Eston Hence MSN, AGACNP-BC [facility] Pulmonary/Critical Care Medicine 07/22/2023, 12:34 PM

## 2023-07-22 NOTE — Progress Notes (Signed)
 Notified of emesis + aspiration event of EN / enteral meds  -EN on hold for now. Hold EN coverage insulin .  -CXR -abd Xray  -PRN enti-emetics    Eston Hence MSN, AGACNP-BC [facility] Pulmonary/Critical Care Medicine 07/22/2023, 10:38 AM

## 2023-07-22 NOTE — Progress Notes (Signed)
 [facility] NEPHROLOGY PROGRESS NOTE  Assessment/ Plan: Pt is a 58 y.o. yo male with past medical history significant for HTN, HLD, type II DM, BPH, seizure who was initially presented at [facility] due to AMS, admitted for sepsis due to pneumonia, encephalopathy, sepsis, AKI, hypercalcemia symptomatic anemia and Lytic pulm lesion concern for multiple myeloma.  # AKI on CKD 3a - b/l creat 1.1- 1.5 from 2024, eGFR 53- >60 ml/min. Creat here 5.8 on presentation at OSH. AKI due to possible myeloma kidney +/- hyperCa +/- ACEi (home med). Initially treated with IV fluid and then required Lasix  because of fluid overload. Started CRRT on 4/23. Pt remains anuric, and tolerating CRRT with some clotting in past24h - heparin  titrated.    # AMS/acute metabolic encephalopathy: per report improved; currently on sedation. MRI and LP so far negative.   #Volume - developing edema - will change net FRR to 50/hr.   # Shock- remains on pressor support, midodrine started today  # Multiple myeloma: Oncologist started Velcade  and Cytoxan , per oncology team.   On hold for now given ongoing critical illness. Dr. Ennevar following closely.   # Neutropenia: post chemoRx, getting IV abx and filgrastim . Oncology following.   # Atrial fib-  on amio.  Current in NSR  #Multifocal PNA - suspected aspiration + MSSE pna. Getting IV meropenem .  Another possible aspiration today.   #Anemia - prn transfusions.  # Hyperuricemia: s/p rasburicase , +dialysis.  # Hypophosphatemia: due to CRRT, remains low. IV Naphos prn  Palliative care involved for this critically ill patient with protracted course.   Subjective:  Seen in ICU remains on vent, sedated with vasopressor support Large emesis event this AM, TF on hold Net +0.8L yesterday, remains anuric D/w RN = clotted circuit x 3 in past 24h, heparin  infusion titrated up   Objective Vital signs in last 24 hours: Vitals:   07/22/23 1107 07/22/23  1126 07/22/23 1145 07/22/23 1152  BP: 107/62 123/68 (!) 104/55   Pulse: 89 79 88 83  Resp: 20 (!) 36 (!) 21 18  Temp: (!) 100.6 F (38.1 C) (!) 100.6 F (38.1 C) (!) 100.6 F (38.1 C) (!) 100.6 F (38.1 C)  TempSrc:      SpO2: 95% 95% 96% 94%  Weight:      Height:        Physical Exam: General: Critically ill looking male, intubated Heart:RRR, s1s2 nl Lungs: Coarse breath sound bilateral on vent Abdomen:soft, Non-tender, non-distended Extremities:No edema, LUE more tense now with 1+ pitting edema, R trace  Neurology: not responsive, on vent   Adrian Alba MD [facility] Pager [phone]   Recent Labs  Lab 07/21/23 1700 07/21/23 1805 07/22/23 0456 07/22/23 0457  HGB  --  8.2*  --  8.2*  ALBUMIN  1.7*  --  1.5*  --   CALCIUM  7.2*  --  7.5*  --   PHOS 2.4*  --  2.7  2.4*  --   CREATININE 1.74*  --  1.65*  --   K 4.9  --  4.4  --     Inpatient medications:  amiodarone   200 mg Per NG tube Daily   arformoterol  15 mcg Nebulization BID   busPIRone   15 mg Per Tube QHS   clonazePAM   1 mg Per Tube BID   docusate  100 mg Per Tube BID   epoetin  alfa  40,000 Units Subcutaneous QODAY   famotidine   10.4 mg Per Tube Daily   feeding  supplement (PROSource TF20)  60 mL Per Tube TID   filgrastim  (NIVESTYM ) SQ  480 mcg Subcutaneous Daily   folic acid   1 mg Per Tube Daily   gabapentin   100 mg Per Tube Q12H   heparin  injection (subcutaneous)  5,000 Units Subcutaneous Q8H   insulin  aspart  0-20 Units Subcutaneous Q4H   insulin  aspart  8 Units Subcutaneous Q4H   insulin  glargine-yfgn  20 Units Subcutaneous BID   ipratropium-albuterol   3 mL Nebulization Q4H   lactulose   20 g Per Tube Daily   nicotine   7 mg Transdermal Daily   mouth rinse  15 mL Mouth Rinse Q2H   PARoxetine   40 mg Per Tube Daily   pneumococcal 20-valent conjugate vaccine  0.5 mL Intramuscular Tomorrow-1000   polyethylene glycol  17 g Per Tube Daily   QUEtiapine   50 mg Per Tube BID   revefenacin   175 mcg Nebulization Daily   sodium chloride  flush  10-40 mL Intracatheter Q12H   sodium chloride  HYPERTONIC  4 mL Nebulization Q4H   thiamine   100 mg Per Tube Daily    dexmedetomidine  (PRECEDEX ) IV infusion 0.5 mcg/kg/hr (07/22/23 0936)   feeding supplement (VITAL 1.5 CAL) Stopped (07/22/23 1101)   fentaNYL  infusion INTRAVENOUS 200 mcg/hr (07/22/23 1159)   heparin  10,000 units/ 20 mL infusion syringe 1,000 Units/hr (07/22/23 1200)   meropenem  (MERREM ) IV Stopped (07/22/23 0707)   norepinephrine  (LEVOPHED ) Adult infusion 7 mcg/min (07/22/23 1159)   prismasol  BGK 4/2.5 400 mL/hr at 07/22/23 0131   prismasol  BGK 4/2.5 400 mL/hr at 07/22/23 0133   prismasol  BGK 4/2.5 1,500 mL/hr at 07/21/23 2317   vancomycin  Stopped (07/21/23 1421)   vasopressin  0.03 Units/min (07/22/23 1159)   voriconazole Stopped (07/22/23 0603)   acetaminophen  (TYLENOL ) oral liquid 160 mg/5 mL **OR** acetaminophen , fentaNYL , heparin , ondansetron  (ZOFRAN ) IV, mouth rinse, oxyCODONE , polyethylene glycol, polyvinyl alcohol , sodium chloride  flush

## 2023-07-22 NOTE — Progress Notes (Signed)
 eLink Physician-Brief Progress Note Patient Name: Frank [name]. DOB: 01/14/1966 MRN: [id number]   Date of Service  07/22/2023  HPI/Events of Note  Tube feeds are on hold and recent blood sugar is 88, he is scheduled to receive 20 units of Lantus  tonight.  eICU Interventions  Lantus  orderr for 20 units discontinued and 5 units of Lantus  ordered overnight (along with SSI coverage.        Eriyanna Kofoed U Halona Amstutz 07/22/2023, 9:59 PM

## 2023-07-22 NOTE — Progress Notes (Signed)
 NAME:  Frank Mensah., MRN:  [id number], DOB:  1966/01/08, LOS: 19 ADMISSION DATE:  07/03/2023, CONSULTATION DATE:  07/06/23 REFERRING MD:  Dr Maury Space, CHIEF COMPLAINT:  Encephalopathy   History of Present Illness:  PCCM asked to see patient for encephalopathy   Transferred from [facility] with 1 week of generalized weakness, altered mentation, decreased appetite Recently being worked up for multiple myeloma.  Transferred to [facility] for oncology evaluation Nephrology consulted for AKI   Background history of obesity, asthma, obstructive sleep apnea on CPAP GERD, hypertension, hyperlipidemia Has been feeling weak for the last few days according to spouse Admitted with concern for sepsis, community-acquired pneumonia, antibiotic encephalopathy Workup revealed lytic bony lesions with concern for multiple myeloma Transfused with 3 units packed red blood cells Evaluation so far-bone marrow 07/04/2023  Pertinent  Medical History   Past Medical History:  Diagnosis Date   Anxiety    Arthritis    Asthma    Bipolar disorder (HCC)    Current every day smoker    Depression    Diabetes mellitus, type II (HCC)    Dyspnea    History of kidney stones    History of pneumonia    Hyperlipidemia    Hypertension    Morbid obesity (HCC)    Sedentary lifestyle    Seizures (HCC)    last seizure 12 yrs ago, no current problem   Sleep apnea    uses CPAP nightly   Significant Hospital Events: Including procedures, antibiotic start and stop dates in addition to other pertinent events   4/19-blood cultures, MRSA PCR negative 4/19 chest x-ray with mild pulmonary congestion 4/20 atrial fibrillation with RVR 4/21 nosebleed after IR NG attempt, NG placed by PCCM in afternoon, stable transferred to [facility] 4/22 intubated; some abla requiring prbcs; ct abd/pelvis showing acute groin hematoma tracking from posterior presumed from site of bone marrow biopsy; heparin  held EEG 4/25 >> moderate to severe  encephalopathy without any seizures or epileptiform discharges 4/23 respiratory culture >> MRSE 4/28 remains intubated on CRRT, down to one pressor  4/29 labile BP, NE back up. Responded a bit to fluids. Received Velcade  and Cytoxan  chemotherapy 5/1 remains encephalopathic, check CTH 5/2 LP ETT exchange  5/5 1 PRBC. Palliative care met w family   Interim History / Subjective:   NAEO  NE down to 5mcg this morning. Remains on 0.03 vaso   Is on 150 fent and 1 precedex , weaning down    Objective   Blood pressure (!) 84/47, pulse 69, temperature (!) 96.9 F (36.1 C), temperature source Axillary, resp. rate 20, height 6' 0.01" (1.829 m), weight 105 kg, SpO2 99%.    Vent Mode: PRVC FiO2 (%):  [30 %] 30 % Set Rate:  [2 bmp-20 bmp] 20 bmp Vt Set:  [540 mL] 540 mL PEEP:  [5 cmH20] 5 cmH20 Plateau Pressure:  [15 cmH20-30 cmH20] 18 cmH20   Intake/Output Summary (Last 24 hours) at 07/22/2023 0846 Last data filed at 07/22/2023 0800 Gross per 24 hour  Intake 4164.08 ml  Output 2982.6 ml  Net 1181.48 ml   Filed Weights   07/20/23 0703 07/20/23 0800 07/22/23 0500  Weight: 105.2 kg 105.2 kg 105 kg   Physical Exam: General: Chronically and critically ill M intubated sedated NAD HEENT: NCAT pink mm Neuro:  sedated, not following commands  CV: rrr s1s2 no rgm cap refill < 3 sec  PULM:  Mechanically ventilated  GI: soft ndnt round  Extremities: softening R groin hematoma. L  Swelling over L bicep   Skin: pale c/d/w   __________________________________________________________  Micro: 4/28 BCx > no growth, final 4/30 respiratory culture-few PMN, rare budding yeast, abundant GPC> few aspergillus fumigatus Quantiferon 5/2 >  * pending Trach asp 5/4> rare GPC  PCP smear trach asp 5/5 > * pending   CSF studies 1 RBC, 1 WBC, glucose 106 CSF culture-2 organisms acute, rare WBC> NGTD Cryptococcal antigen negative meningitis and encephalitis panel negative VDRL  non-reactive  AFB CSF 5/2> *  pending   Assessment & Plan:   Acute encephalopathy, improving Hx seizure disorder Hyperammonemia, improved  P -delirium precautions  -cont home paxil  gabapentin   -cont lactulose  -holding home VPA -- may not be able to resume. Will check LFTs 5/7    Acute respiratory failure w hypoxia Small bilateral pleural effusions Bilateral ASD, possible PNA  Hx OSA Hx tobacco use  -originally aspiration of blood 2/2 epistaxis -MSSE PNA -aspirgillus fumigatus + but after d/w ID 5/5 less likely invasive infection  P -cont MV support. VAP, pulm hygiene.  -Family is discussing measures like trach (has been intubated since 4/22) and there is another family mtg planned 5/7 at 1600  -ID recs have updated -- 2week course of vori  -ID has ordered for RT-performed mini BAL for pjp  -cont brovana, yupelri duoneb. Avoiding ICS  -nicotine    Septic shock  -MSSE PNA -MRSA PCR neg 5/5  P -Plan is for 2 wk vori  -vanc, mero -- plan is to dc if 5/5 bcx are negative  -MAP goal > 65 -- NE and vaso for goal  -follow cx data   Multiple myeloma Neutropenia, possible neutropenic fever TLS, improved  -appreciate Oncology's management; received Velcade  and Cytotoxan 4/29,  Velcade  held 5/2. Sounds like velcade  and cytoxan  would not usually catalyze this sort of neutropenia.  P -onc following-- chemo is currently on hold w other ongoing issues -immunoglobulin levels re-sent 5/5  -as above, is no growth 5/5 abx, will stop mero vanc  -IVIG 5/6 -neupogen    AKI on CKD 3a  Hyponatremia  -starting to have filter clotting issues 5/6  P -RRT per nephro   pAF now sinus  P - I appreciate Onc's thoughts re amio impacting his blood counts, do think we can move up his scheduled wean from BID to daily & will make that transition 5/6 instead of 5/8. Will plan for 14d 200mg  amio per tube starting 5/6  -AC was deferred w bleeding issues this admission, now that plt are improving could consider AC should he go back  into fib  Anemia, multifactorial  Thrombocytopenia, improving  -ABLA 2/2 R groin hematoma after bmbx -- stable.  -critical illness -MM  -possible drug-related  P -Follow CBC daily -goal hgb> 7   DM2 w hyperglycemia -BID glargine 20 u  - rSSI  -8 u novolog  q4hr EN coverage    Best Practice (right click and "Reselect all SmartList Selections" daily)   Diet/type: tubefeeds DVT prophylaxis prophylactic heparin   Pressure ulcer(s): identified on: 19/Apr/2025 GI prophylaxis: H2B Lines: Central line and Dialysis Catheter Foley:  na  Code Status:  full code Last date of multidisciplinary goals of care discussion [pall care d/w fam 5/5

## 2023-07-23 DIAGNOSIS — J9601 Acute respiratory failure with hypoxia: Secondary | ICD-10-CM | POA: Diagnosis not present

## 2023-07-23 DIAGNOSIS — Z7189 Other specified counseling: Secondary | ICD-10-CM

## 2023-07-23 DIAGNOSIS — R531 Weakness: Secondary | ICD-10-CM | POA: Diagnosis not present

## 2023-07-23 DIAGNOSIS — J181 Lobar pneumonia, unspecified organism: Secondary | ICD-10-CM | POA: Diagnosis not present

## 2023-07-23 DIAGNOSIS — J90 Pleural effusion, not elsewhere classified: Secondary | ICD-10-CM | POA: Diagnosis not present

## 2023-07-23 DIAGNOSIS — I4891 Unspecified atrial fibrillation: Secondary | ICD-10-CM | POA: Diagnosis not present

## 2023-07-23 DIAGNOSIS — Z87891 Personal history of nicotine dependence: Secondary | ICD-10-CM | POA: Diagnosis not present

## 2023-07-23 DIAGNOSIS — G9340 Encephalopathy, unspecified: Secondary | ICD-10-CM | POA: Diagnosis not present

## 2023-07-23 DIAGNOSIS — C9000 Multiple myeloma not having achieved remission: Secondary | ICD-10-CM | POA: Diagnosis not present

## 2023-07-23 DIAGNOSIS — A419 Sepsis, unspecified organism: Secondary | ICD-10-CM | POA: Diagnosis not present

## 2023-07-23 LAB — RENAL FUNCTION PANEL
Albumin: <1.5 g/dL — ABNORMAL LOW (ref 3.5–5.0)
Albumin: 1.6 g/dL — ABNORMAL LOW (ref 3.5–5.0)
Anion gap: 5 (ref 5–15)
Anion gap: 6 (ref 5–15)
BUN: 33 mg/dL — ABNORMAL HIGH (ref 6–20)
BUN: 31 mg/dL — ABNORMAL HIGH (ref 6–20)
CO2: 24 mmol/L (ref 22–32)
CO2: 25 mmol/L (ref 22–32)
Calcium: 7.5 mg/dL — ABNORMAL LOW (ref 8.9–10.3)
Calcium: 7.4 mg/dL — ABNORMAL LOW (ref 8.9–10.3)
Chloride: 99 mmol/L (ref 98–111)
Chloride: 102 mmol/L (ref 98–111)
Creatinine, Ser: 1.62 mg/dL — ABNORMAL HIGH (ref 0.61–1.24)
Creatinine, Ser: 1.67 mg/dL — ABNORMAL HIGH (ref 0.61–1.24)
GFR, Estimated: 49 mL/min — ABNORMAL LOW (ref >60)
GFR, Estimated: 47 mL/min — ABNORMAL LOW (ref >60)
Glucose, Bld: 128 mg/dL — ABNORMAL HIGH (ref 70–99)
Glucose, Bld: 91 mg/dL (ref 70–99)
Phosphorus: 1.9 mg/dL — ABNORMAL LOW (ref 2.5–4.6)
Phosphorus: 3.4 mg/dL (ref 2.5–4.6)
Potassium: 4.2 mmol/L (ref 3.5–5.1)
Potassium: 4.9 mmol/L (ref 3.5–5.1)
Sodium: 128 mmol/L — ABNORMAL LOW (ref 135–145)
Sodium: 133 mmol/L — ABNORMAL LOW (ref 135–145)

## 2023-07-23 LAB — FUNGITELL BETA-D-GLUCAN: Result Name:: POSITIVE — AB

## 2023-07-23 LAB — HEPATIC FUNCTION PANEL
ALT: 14 U/L (ref 0–44)
AST: 19 U/L (ref 15–41)
Albumin: <1.5 g/dL — ABNORMAL LOW (ref 3.5–5.0)
Alkaline Phosphatase: 59 U/L (ref 38–126)
Bilirubin, Direct: <0.1 mg/dL (ref 0.0–0.2)
Total Bilirubin: 0.5 mg/dL (ref 0.0–1.2)
Total Protein: 8.5 g/dL — ABNORMAL HIGH (ref 6.5–8.1)

## 2023-07-23 LAB — QUANTIFERON-TB GOLD PLUS (RQFGPL)
QuantiFERON Mitogen Value: 1.39 [IU]/mL
QuantiFERON Nil Value: 0.06 [IU]/mL
QuantiFERON TB1 Ag Value: 0.06 [IU]/mL
QuantiFERON TB2 Ag Value: 0.06 [IU]/mL

## 2023-07-23 LAB — GLUCOSE, CAPILLARY
Glucose-Capillary: 118 mg/dL — ABNORMAL HIGH (ref 70–99)
Glucose-Capillary: 155 mg/dL — ABNORMAL HIGH (ref 70–99)
Glucose-Capillary: 126 mg/dL — ABNORMAL HIGH (ref 70–99)
Glucose-Capillary: 75 mg/dL (ref 70–99)
Glucose-Capillary: 83 mg/dL (ref 70–99)

## 2023-07-23 LAB — IGG, IGA, IGM
IgA: 62 mg/dL — ABNORMAL LOW (ref 90–386)
IgG (Immunoglobin G), Serum: 4659 mg/dL — ABNORMAL HIGH (ref 603–1613)
IgM (Immunoglobulin M), Srm: <5 mg/dL — ABNORMAL LOW (ref 20–172)

## 2023-07-23 LAB — KAPPA/LAMBDA LIGHT CHAINS
Kappa free light chain: 138 mg/L — ABNORMAL HIGH (ref 3.3–19.4)
Kappa, lambda light chain ratio: 6.93 — ABNORMAL HIGH (ref 0.26–1.65)
Lambda free light chains: 19.9 mg/L (ref 5.7–26.3)

## 2023-07-23 LAB — DIFFERENTIAL
Abs Immature Granulocytes: 1.67 10*3/uL — ABNORMAL HIGH (ref 0.00–0.07)
Basophils Absolute: 0 10*3/uL (ref 0.0–0.1)
Basophils Relative: 0 %
Eosinophils Absolute: 0.1 10*3/uL (ref 0.0–0.5)
Eosinophils Relative: 1 %
Immature Granulocytes: 14 %
Lymphocytes Relative: 5 %
Lymphs Abs: 0.6 10*3/uL — ABNORMAL LOW (ref 0.7–4.0)
Monocytes Absolute: 1.2 10*3/uL — ABNORMAL HIGH (ref 0.1–1.0)
Monocytes Relative: 10 %
Neutro Abs: 8.6 10*3/uL — ABNORMAL HIGH (ref 1.7–7.7)
Neutrophils Relative %: 70 %

## 2023-07-23 LAB — CBC
HCT: 28.3 % — ABNORMAL LOW (ref 39.0–52.0)
Hemoglobin: 8.3 g/dL — ABNORMAL LOW (ref 13.0–17.0)
MCH: 30 pg (ref 26.0–34.0)
MCHC: 29.3 g/dL — ABNORMAL LOW (ref 30.0–36.0)
MCV: 102.2 fL — ABNORMAL HIGH (ref 80.0–100.0)
Platelets: 153 10*3/uL (ref 150–400)
RBC: 2.77 MIL/uL — ABNORMAL LOW (ref 4.22–5.81)
RDW: 20.7 % — ABNORMAL HIGH (ref 11.5–15.5)
WBC: 12.6 10*3/uL — ABNORMAL HIGH (ref 4.0–10.5)
nRBC: 7.1 % — ABNORMAL HIGH (ref 0.0–0.2)

## 2023-07-23 LAB — PROCALCITONIN: Procalcitonin: 1.55 ng/mL

## 2023-07-23 LAB — MAGNESIUM: Magnesium: 2.6 mg/dL — ABNORMAL HIGH (ref 1.7–2.4)

## 2023-07-23 LAB — AMMONIA: Ammonia: 49 umol/L — ABNORMAL HIGH (ref 9–35)

## 2023-07-23 LAB — BRAIN NATRIURETIC PEPTIDE: B Natriuretic Peptide: 301.2 pg/mL — ABNORMAL HIGH (ref 0.0–100.0)

## 2023-07-23 LAB — QUANTIFERON-TB GOLD PLUS: QuantiFERON-TB Gold Plus: NEGATIVE

## 2023-07-23 LAB — APTT: aPTT: 39 s — ABNORMAL HIGH (ref 24–36)

## 2023-07-23 LAB — CORTISOL: Cortisol, Plasma: 14.3 ug/dL

## 2023-07-23 LAB — PHOSPHORUS: Phosphorus: 1.9 mg/dL — ABNORMAL LOW (ref 2.5–4.6)

## 2023-07-23 MED ORDER — HEPARIN SODIUM (PORCINE) 5000 UNIT/ML IJ SOLN
5000.0000 [IU] | Freq: Three times a day (TID) | INTRAMUSCULAR | Status: AC
  Administered 2023-07-25 – 2023-08-13 (×59): 5000 [IU] via SUBCUTANEOUS
  Filled 2023-07-23 (×60): qty 1

## 2023-07-23 MED ORDER — MIDODRINE HCL 5 MG PO TABS
10.0000 mg | ORAL_TABLET | Freq: Three times a day (TID) | ORAL | Status: DC
  Administered 2023-07-23 – 2023-07-25 (×5): 10 mg
  Filled 2023-07-23 (×5): qty 2

## 2023-07-23 MED ORDER — LACTULOSE 10 GM/15ML PO SOLN
20.0000 g | Freq: Two times a day (BID) | ORAL | Status: DC
  Administered 2023-07-23 – 2023-07-24 (×4): 20 g
  Filled 2023-07-23 (×3): qty 30

## 2023-07-23 MED ORDER — SODIUM PHOSPHATES 45 MMOLE/15ML IV SOLN
30.0000 mmol | Freq: Once | INTRAVENOUS | Status: AC
  Administered 2023-07-23: 30 mmol via INTRAVENOUS
  Filled 2023-07-23: qty 10

## 2023-07-23 MED ORDER — LIDOCAINE-EPINEPHRINE 1 %-1:100000 IJ SOLN
20.0000 mL | Freq: Once | INTRAMUSCULAR | Status: AC
  Administered 2023-08-07: 20 mL via INTRADERMAL
  Filled 2023-07-23: qty 1

## 2023-07-23 MED ORDER — LEVOFLOXACIN IN D5W 500 MG/100ML IV SOLN
500.0000 mg | INTRAVENOUS | Status: DC
Start: 2023-07-23 — End: 2023-07-28
  Administered 2023-07-23 – 2023-07-27 (×5): 500 mg via INTRAVENOUS
  Filled 2023-07-23 (×6): qty 100

## 2023-07-23 MED ORDER — ROCURONIUM BROMIDE 10 MG/ML (PF) SYRINGE
100.0000 mg | PREFILLED_SYRINGE | Freq: Once | INTRAVENOUS | Status: AC
  Administered 2023-07-24: 100 mg via INTRAVENOUS
  Filled 2023-07-23: qty 10

## 2023-07-23 MED ORDER — ETOMIDATE 2 MG/ML IV SOLN
20.0000 mg | Freq: Once | INTRAVENOUS | Status: AC
  Administered 2023-07-24: 20 mg via INTRAVENOUS
  Filled 2023-07-23: qty 10

## 2023-07-23 MED ORDER — FENTANYL CITRATE (PF) 100 MCG/2ML IJ SOLN
200.0000 ug | Freq: Once | INTRAMUSCULAR | Status: AC
  Administered 2023-07-24: 100 ug via INTRAVENOUS
  Filled 2023-07-23: qty 4

## 2023-07-23 MED ORDER — MIDAZOLAM HCL 2 MG/2ML IJ SOLN
5.0000 mg | Freq: Once | INTRAMUSCULAR | Status: AC
  Administered 2023-07-24: 4 mg via INTRAVENOUS
  Filled 2023-07-23: qty 6

## 2023-07-23 NOTE — Progress Notes (Signed)
 [facility] NEPHROLOGY PROGRESS NOTE  Assessment/ Plan: Pt is a 58 y.o. yo male with past medical history significant for HTN, HLD, type II DM, BPH, seizure who was initially presented at [facility] due to AMS, admitted for sepsis due to pneumonia, encephalopathy, sepsis, AKI, hypercalcemia symptomatic anemia and Lytic pulm lesion concern for multiple myeloma.  # AKI on CKD 3a - b/l creat 1.1- 1.5 from 2024, eGFR 53- >60 ml/min. Creat here 5.8 on presentation at OSH. AKI due to possible myeloma kidney +/- hyperCa +/- ACEi (home med). Initially treated with IV fluid and then required Lasix  because of fluid overload. Started CRRT on 4/23. Pt remains anuric, and tolerating CRRT.  No further clotting after circuit heparin  increased 5/6   # AMS/acute metabolic encephalopathy: per report improved; currently on sedation. MRI and LP so far negative.   #Volume -maintain slightly net neg.  Suspect his hyponatremia is mild hypervolemia.   # Shock- remains on pressor support, midodrine titrated today  # Multiple myeloma: Oncologist started Velcade  and Cytoxan , per oncology team.   On hold for now given ongoing critical illness. Dr. Ennevar following closely.   # Neutropenia: post chemoRx, getting IV abx and filgrastim . WBC up 2 > 12k past 24h.  Oncology following. ID following.  # Atrial fib-  on amio.  Current in NSR  #Multifocal PNA - ID following, changed to levaquin to complete course sufficient for legionella today.  #Anemia - prn transfusions. On epogen  per onc.   # Hypophosphatemia: due to CRRT, remains low. IV Naphos prn  Palliative care involved for this critically ill patient with protracted course.   Subjective:  Seen in ICU remains on vent, sedated with vasopressor support Net 0.5L yesterday, remains anuric Trach planned tomorrow   Objective Vital signs in last 24 hours: Vitals:   07/23/23 0700 07/23/23 0715 07/23/23 0730 07/23/23 0758  BP: 113/64 (!)  107/57 105/60   Pulse: 86 84 86   Resp: 20 18 20    Temp: 99.5 F (37.5 C) 99.5 F (37.5 C) 99.5 F (37.5 C)   TempSrc:      SpO2: 97% 98% 98% 95%  Weight:      Height:        Physical Exam: General: Critically ill looking male, intubated Heart:RRR, s1s2 nl Lungs: Coarse breath sound bilateral on vent Abdomen:soft, Non-tender, non-distended Extremities:No edema, LUE more tense now with 1+ pitting edema, R trace  Neurology: not responsive, on vent   Adrian Alba MD [facility] Pager [phone]   Recent Labs  Lab 07/22/23 0457 07/22/23 1641 07/23/23 0416  HGB 8.2*  --  8.3*  ALBUMIN   --  <1.5* <1.5*  <1.5*  CALCIUM   --  7.4* 7.5*  PHOS  --  1.9* 1.9*  1.9*  CREATININE  --  1.60* 1.62*  K  --  4.8 4.2    Inpatient medications:  amiodarone   200 mg Per NG tube Daily   arformoterol  15 mcg Nebulization BID   busPIRone   15 mg Per Tube QHS   clonazePAM   1 mg Per Tube BID   docusate  100 mg Per Tube BID   epoetin  alfa  40,000 Units Subcutaneous QODAY   [START ON 07/24/2023] etomidate   20 mg Intravenous Once   famotidine   10.4 mg Per Tube Daily   feeding supplement (PROSource TF20)  60 mL Per Tube TID   [START ON 07/24/2023] fentaNYL  (SUBLIMAZE ) injection  200 mcg Intravenous Once   filgrastim  (NIVESTYM ) SQ  480 mcg Subcutaneous Daily   folic acid   1 mg Per Tube Daily   gabapentin   100 mg Per Tube Q12H   heparin  injection (subcutaneous)  5,000 Units Subcutaneous Q8H   [START ON 07/25/2023] heparin  injection (subcutaneous)  5,000 Units Subcutaneous Q8H   insulin  aspart  0-20 Units Subcutaneous Q4H   insulin  aspart  8 Units Subcutaneous Q4H   ipratropium-albuterol   3 mL Nebulization Q4H   lactulose   20 g Per Tube BID   [START ON 07/24/2023] lidocaine -EPINEPHrine   20 mL Intradermal Once   [START ON 07/24/2023] midazolam   5 mg Intravenous Once   midodrine  10 mg Per Tube TID WC   nicotine   7 mg Transdermal Daily   mouth rinse  15 mL Mouth Rinse Q2H   PARoxetine    40 mg Per Tube Daily   pneumococcal 20-valent conjugate vaccine  0.5 mL Intramuscular Tomorrow-1000   polyethylene glycol  17 g Per Tube Daily   QUEtiapine   50 mg Per Tube BID   revefenacin  175 mcg Nebulization Daily   [START ON 07/24/2023] rocuronium   100 mg Intravenous Once   sodium chloride  flush  10-40 mL Intracatheter Q12H   thiamine   100 mg Per Tube Daily    dexmedetomidine  (PRECEDEX ) IV infusion 0.5 mcg/kg/hr (07/23/23 0717)   feeding supplement (VITAL 1.5 CAL) 60 mL/hr at 07/23/23 1100   fentaNYL  infusion INTRAVENOUS 200 mcg/hr (07/23/23 1100)   heparin  10,000 units/ 20 mL infusion syringe 1,000 Units/hr (07/23/23 1034)   levofloxacin (LEVAQUIN) IV 100 mL/hr at 07/23/23 1100   norepinephrine  (LEVOPHED ) Adult infusion 9 mcg/min (07/23/23 1100)   prismasol  BGK 4/2.5 400 mL/hr at 07/23/23 0430   prismasol  BGK 4/2.5 400 mL/hr at 07/23/23 0430   prismasol  BGK 4/2.5 1,500 mL/hr at 07/23/23 0618   sodium PHOSPHATE  IVPB (in mmol) 43 mL/hr at 07/23/23 1100   acetaminophen  (TYLENOL ) oral liquid 160 mg/5 mL **OR** acetaminophen , fentaNYL , heparin , ondansetron  (ZOFRAN ) IV, mouth rinse, oxyCODONE , polyethylene glycol, polyvinyl alcohol , sodium chloride  flush

## 2023-07-23 NOTE — Plan of Care (Signed)
  Problem: Education: Goal: Knowledge of General Education information will improve Description: Including pain rating scale, medication(s)/side effects and non-pharmacologic comfort measures Outcome: Progressing   Problem: Health Behavior/Discharge Planning: Goal: Ability to manage health-related needs will improve Outcome: Progressing   Problem: Clinical Measurements: Goal: Ability to maintain clinical measurements within normal limits will improve Outcome: Progressing Goal: Will remain free from infection Outcome: Progressing Goal: Diagnostic test results will improve Outcome: Progressing Goal: Respiratory complications will improve Outcome: Progressing Goal: Cardiovascular complication will be avoided Outcome: Progressing   Problem: Activity: Goal: Risk for activity intolerance will decrease Outcome: Progressing   Problem: Nutrition: Goal: Adequate nutrition will be maintained Outcome: Progressing   Problem: Coping: Goal: Level of anxiety will decrease Outcome: Progressing   Problem: Elimination: Goal: Will not experience complications related to bowel motility Outcome: Progressing Goal: Will not experience complications related to urinary retention Outcome: Progressing   Problem: Pain Managment: Goal: General experience of comfort will improve and/or be controlled Outcome: Progressing   Problem: Safety: Goal: Ability to remain free from injury will improve Outcome: Progressing   Problem: Skin Integrity: Goal: Risk for impaired skin integrity will decrease Outcome: Progressing   Problem: Education: Goal: Ability to describe self-care measures that may prevent or decrease complications (Diabetes Survival Skills Education) will improve Outcome: Progressing Goal: Individualized Educational Video(s) Outcome: Progressing   Problem: Coping: Goal: Ability to adjust to condition or change in health will improve Outcome: Progressing   Problem: Fluid  Volume: Goal: Ability to maintain a balanced intake and output will improve Outcome: Progressing   Problem: Health Behavior/Discharge Planning: Goal: Ability to identify and utilize available resources and services will improve Outcome: Progressing Goal: Ability to manage health-related needs will improve Outcome: Progressing   Problem: Metabolic: Goal: Ability to maintain appropriate glucose levels will improve Outcome: Progressing   Problem: Nutritional: Goal: Maintenance of adequate nutrition will improve Outcome: Progressing Goal: Progress toward achieving an optimal weight will improve Outcome: Progressing   Problem: Skin Integrity: Goal: Risk for impaired skin integrity will decrease Outcome: Progressing   Problem: Tissue Perfusion: Goal: Adequacy of tissue perfusion will improve Outcome: Progressing   Problem: Activity: Goal: Ability to tolerate increased activity will improve Outcome: Progressing   Problem: Respiratory: Goal: Ability to maintain a clear airway and adequate ventilation will improve Outcome: Progressing   Problem: Role Relationship: Goal: Method of communication will improve Outcome: Progressing

## 2023-07-23 NOTE — Progress Notes (Signed)
 NAME:  Frank Kiddy., MRN:  [id number], DOB:  Apr 12, 1965, LOS: 20 ADMISSION DATE:  07/03/2023, CONSULTATION DATE:  07/06/23 REFERRING MD:  Dr Maury Space, CHIEF COMPLAINT:  Encephalopathy   History of Present Illness:  PCCM asked to see patient for encephalopathy   Transferred from [facility] with 1 week of generalized weakness, altered mentation, decreased appetite Recently being worked up for multiple myeloma.  Transferred to [facility] for oncology evaluation Nephrology consulted for AKI   Background history of obesity, asthma, obstructive sleep apnea on CPAP GERD, hypertension, hyperlipidemia Has been feeling weak for the last few days according to spouse Admitted with concern for sepsis, community-acquired pneumonia, antibiotic encephalopathy Workup revealed lytic bony lesions with concern for multiple myeloma Transfused with 3 units packed red blood cells Evaluation so far-bone marrow 07/04/2023  Pertinent  Medical History   Past Medical History:  Diagnosis Date   Anxiety    Arthritis    Asthma    Bipolar disorder (HCC)    Current every day smoker    Depression    Diabetes mellitus, type II (HCC)    Dyspnea    History of kidney stones    History of pneumonia    Hyperlipidemia    Hypertension    Morbid obesity (HCC)    Sedentary lifestyle    Seizures (HCC)    last seizure 12 yrs ago, no current problem   Sleep apnea    uses CPAP nightly   Significant Hospital Events: Including procedures, antibiotic start and stop dates in addition to other pertinent events   4/19-blood cultures, MRSA PCR negative 4/19 chest x-ray with mild pulmonary congestion 4/20 atrial fibrillation with RVR 4/21 nosebleed after IR NG attempt, NG placed by PCCM in afternoon, stable transferred to [facility] 4/22 intubated; some abla requiring prbcs; ct abd/pelvis showing acute groin hematoma tracking from posterior presumed from site of bone marrow biopsy; heparin  held EEG 4/25 >> moderate to severe  encephalopathy without any seizures or epileptiform discharges 4/23 respiratory culture >> MRSE 4/28 remains intubated on CRRT, down to one pressor  4/29 labile BP, NE back up. Responded a bit to fluids. Received Velcade  and Cytoxan  chemotherapy 5/1 remains encephalopathic, check CTH 5/2 LP ETT exchange  5/5 1 PRBC. Palliative care met w family  5/6 IVIG. plan for trach established. Added midodrine after failed vaso wean. Abx/fungal changed to levaquin  5/7 weaning pressors incr midodrine incr lactulose     Interim History / Subjective:   NE 10 vaso off Fent 200 precedex  1   WBC big jump from 2 to 12  Na lower at 128 phos low at 1.9  Ammonia elevated at 49   PJP neg   Cortisol pending   Objective   Blood pressure 105/60, pulse 86, temperature 99.5 F (37.5 C), resp. rate 20, height 6' 0.01" (1.829 m), weight 104.5 kg, SpO2 95%.    Vent Mode: PRVC FiO2 (%):  [30 %] 30 % Set Rate:  [20 bmp] 20 bmp Vt Set:  [540 mL] 540 mL PEEP:  [5 cmH20] 5 cmH20 Plateau Pressure:  [10 cmH20-19 cmH20] 19 cmH20   Intake/Output Summary (Last 24 hours) at 07/23/2023 0953 Last data filed at 07/23/2023 0900 Gross per 24 hour  Intake 3076.53 ml  Output 3737.1 ml  Net -660.57 ml   Filed Weights   07/20/23 0800 07/22/23 0500 07/23/23 0500  Weight: 105.2 kg 105 kg 104.5 kg     Physical Exam: General: chronically and critically ill adult M intubated sedated  HEENT: NCAT. Beard. ETT secure. Anicteric sclera  Neuro:  sedated. Moving spontaneously.  CV: rrr cap refill brisk  PULM:  mechanically ventilated with intermittent dyssynchrony GI: round soft  Extremities:  soft R groin hematoma. Stable swelling overlying L bicep  Skin: pale c/d  __________________________________________________________  Micro: 4/28 BCx > no growth, final 4/30 respiratory culture-few PMN, rare budding yeast, abundant GPC> few aspergillus fumigatus Quantiferon 5/2 > neg Trach asp 5/4> rare GPC, rare mold  PCP smear  trach asp 5/5 > neg   CSF studies 1 RBC, 1 WBC, glucose 106 CSF culture-2 organisms acute, rare WBC> NGTD Cryptococcal antigen negative meningitis and encephalitis panel negative VDRL  non-reactive  AFB CSF 5/2> * pending   Assessment & Plan:   Acute encephalopathy Hx seizure disorder Hyperammonemia P -incr lactulose  to BID. Recheck ammonia 5/9   -delirium precautions  -cont home paxil  gabapentin   -holding home VPA w ammonia issues  -d/w onc -- plex not felt indicated in this instance  Acute respiratory failure w hypoxia Small bilateral pleural effusions Bilateral ASD, possible PNA  Hx OSA Hx tobacco use  -originally aspiration of blood 2/2 epistaxis -MSSE PNA -aspirgillus fumigatus + but after d/w ID 5/5 less likely invasive infection  -pjp neg  P -cont MV support. VAP, pulm hygiene.  -Plan for trach 5/7 with Drs Felipe Horton and Zaida Hertz.   -NPO at midnight + pre-trach orders placed  (Timed for 07/24/23 at noon for now)  -cont brovana yupelri duoneb -check BNP  -- wondering if we might need to be more aggressive about pulling fluid on CRRT even if pressor req has to incr to facilitate   Septic shock v vasoplegia  -MSSE PNA -MRSA PCR neg 5/5  P  -antimicrobial plan consolidated to levaquin starting 5/6 for possible legionella coverage. Unlikely invasive fungal infection so vori dc -- mold in trach asp possibly r/t THC use  -cortisol pending -cont midodrine  -wean pressors for MAP > 65  -sending a PCT   Multiple myeloma  Neutropenia, possible neutropenic fever -- now slight leukocytosis  TLS, improved  -appreciate Oncology's management; received Velcade  and Cytotoxan 4/29,  Velcade  held 5/2. Sounds like velcade  and cytoxan  would not usually catalyze this sort of neutropenia. -got IVIG 5/6 -5/7 swing from WBC 2 to 13 P -onc following-- chemo is currently on hold w other ongoing issues -immunoglobulin levels re-sent 5/5  -neupogen    AKI on CKD 3a  Hyponatremia   Hypophosphatemia  -starting to have filter clotting issues 5/6  P -RRT per nephro  -getting NaPhos 5/6  -follow renal indices   pAF  -now sinus  P - amio 200mg  every day per tube x 14d  -AC was deferred w bleeding issues this admission, now that plt are improving could consider AC should he go back into fib  Anemia, multifactorial  Thrombocytopenia, improving  -ABLA 2/2 R groin hematoma after bmbx -- stable.  -critical illness -MM  -possible drug-related  P -Follow CBC daily -goal hgb> 7   DM2 w hyperglycemia -was on Bid glargine 20 u + rSSI + 8u novolog  q4 prior to emesis/aspiration 5/6 after which EN was held and gradually incr P - rSSI + EN coverage. Can re-dose basal 5/7 PM individual dose as needed pending his CBG trend throughout the day-- he will be NPO/no EN starting 5/8 at 0000 at which time EN coverage will also be held     Best Practice (right click and "Reselect all SmartList Selections" daily)   Diet/type: tubefeeds --  Hold 5/8 0000 for trach 5/8  DVT prophylaxis prophylactic heparin   Pressure ulcer(s): identified on: 19/Apr/2025 GI prophylaxis: H2B Lines: Central line and Dialysis Catheter Foley:  na  Code Status:  full code Last date of multidisciplinary goals of care discussion d/w wife 5/6    CRITICAL CARE Performed by: Delories Fetter   Total critical care time: 41 minutes  Critical care time was exclusive of separately billable procedures and treating other patients. Critical care was necessary to treat or prevent imminent or life-threatening deterioration.  Critical care was time spent personally by me on the following activities: development of treatment plan with patient and/or surrogate as well as nursing, discussions with consultants, evaluation of patient's response to treatment, examination of patient, obtaining history from patient or surrogate, ordering and performing treatments and interventions, ordering and review of laboratory  studies, ordering and review of radiographic studies, pulse oximetry and re-evaluation of patient's condition.  Eston Hence MSN, AGACNP-BC [facility] Amion for pager  07/23/2023, 9:53 AM

## 2023-07-23 NOTE — Progress Notes (Signed)
 So far, he has had a fantastic response to chemotherapy.  Even with what I would consider short-term chemotherapy, his IgG level went down from 11,500 mg/dL to 4660 milligrams per deciliter.  I do not have back his light chains yet.[name]  Unfortunately, he still has not improved from a cognitive point of view.  He is still on the ventilator.  He is still getting sick CRRT.  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  He is getting IV copper which I think is a great idea.  His white cell count finally is come up.  Today his white cell count is 12.6.  Hemoglobin 8.3.  Platelet count 153,000.  We can certainly stop the G-CSF.  His sodium is 128.  Potassium 4.2.  BUN 33 creatinine 1.62.  Calcium  7.5 with albumin  of less than 1.5.  Have to believe that he has something going on with his liver that would lead to such a low albumin .  Sometimes, we see light chain myeloma have such a low albumin .  However, he does not qualify as a light chain myeloma.  He continues on antibiotics.  Again, is not neutropenic.  Maybe, antibiotics could possibly be discontinued.  I know there is still a lot going on with Mr. Mcrea.  Again, his myeloma numbers have really gotten better.  His leukopenia has also resolved.  He still is having some low-grade temperatures.  This morning, the temperature was 99 degrees.  His blood pressure fluctuates.  He has been on pressor support.  I know that the ICU staff have done an incredible job with him.  I just really wish she would improve from a cognitive point of view.  He has had a spinal tap.  This essentially was unremarkable.  Cultures are all negative.  [name], MD  2 Cor 12:9

## 2023-07-23 NOTE — Progress Notes (Addendum)
 [facility]  Date of Admission:  07/03/2023       Lines: Ett 5/2-c Right internal jugular Hd cath 4/23-c  Left femoral triple lumen cvc 4/22-c   Abx: 4/17 Azith >> 4/20  4/17 CTX >> 4/22 4/18 Fluconazole  > 4/23 4/22 Zosyn  >> 5/1 4/28: Vanco>>5/05 5/1 Meropenem  >>  5/4 Voriconazole >>   Other: Dexamethasone  4/18-20 Methyl pred medrol  dose pack 3/20 for 6 days   ASSESSMENT/recs 58 yo male with left orif 2024/2024, hx hip and knee arthroplasty, tobacco use/copd, marijuanna use, ckd3, aggressive MM recent dx 06/2023, admitted 4/22 in transfer from [place] for 1 week generalized weakness found to have aki, acute on chronic anemia, confusion. 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 likely colonizer.  Has been on prolonged iv abx piptazo --> vanc/meropenem ; respiratory cx shows normal flora. Abx hasn't done much for fever curve 4/21 tte normal ef 5/5 trach aspirate negative Pjp stain  5/6 discussed with pulm team again. Tumor lysis at onset but had quickly resolved. Crrt borderline temperature. Fio2 requirement has been really low and decreased to 30% today from 50% on 5/5. Aspirated last night but otherwise stable mild white sputum out of ett. Main issue is the cns encephalopathy. LP previously no cytology obtained (negative cx/pcr). Team has discussed plex with dr Bolling Bushy  5/6 team added midodrine able to wean down pressors. Remains 99's temperature for most part on  crrt  5/7 ams/?critical illness myopathy limiting sbt/extubation; tracheostomy planned 5/7 am cortisol 14.3; wbc normalized with daily neupogen  since 4/30   Working dx/loose end -- Legionella if present hasn't been fully treated. Changed all antimicrobial to levofloxacin planned 7 days, starting 5/6 No suspicion for fungal infection -- aspergillus a colonizer; pjp smear negative Primary problem not pulmonary Low grade fever suspect from MM   -finish levoflox x7 days on 5/13 -maintain standard isolation precaution -supportive care per pulm/oncology otherwise.        #ams 5/2 lp normal glucose, protein, and only 1 wbc. No cytology/flow sent Suspect metabolic. 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 Ammonia level had normalized by 07/17/23 5/7 remains intubated on sedation/analgesia.   -will defer to pulm ccm and oncology regarding plex. From id standpoint will not affect dx or tx plan    #aki on ckd Shock, MM related and ace-I at home Crrt started here 5/6 remains on stable low dose pressors in setting crrt   #mm #tumor lysis #hypercalcemia Aggressive cytogenetics  Chemo 4/22 as mentioned -- high uric acid within 24 hours chemo but quickly decreased/normalized Rasburicase  preemptive prophy previouslys tarted Onc following     Principal Problem:   Generalized weakness Active Problems:   Multiple myeloma not having achieved remission (HCC)   Atrial fibrillation with rapid ventricular response (HCC)   AKI (acute kidney injury) (HCC)   Acute respiratory failure with hypoxia (HCC)   PAF (paroxysmal atrial fibrillation) (HCC)   Acute encephalopathy   Severe sepsis with septic  shock (HCC)   On mechanically assisted ventilation (HCC)   Acute metabolic encephalopathy   Lobar pneumonia, unspecified organism (HCC)   Allergies  Allergen Reactions   Morphine And Codeine Anaphylaxis   Shellfish Allergy Anaphylaxis    Betadine  [Povidone Iodine ] Itching   Bupropion Other (See Comments)    Shaking of the body, hallucinations    Chlorhexidine  Hives    Patient reports never had issues CHG with mouth rinse.   Influenza Vaccines Hives   Metoprolol  Rash    Scheduled Meds:  amiodarone   200 mg Per NG tube Daily   arformoterol  15 mcg Nebulization BID   busPIRone   15 mg Per Tube QHS   clonazePAM   1 mg Per Tube BID   docusate  100 mg Per Tube BID   epoetin  alfa  40,000 Units Subcutaneous QODAY   [START ON 07/24/2023] etomidate   20 mg Intravenous Once   famotidine   10.4 mg Per Tube Daily   feeding supplement (PROSource TF20)  60 mL Per Tube TID   [START ON 07/24/2023] fentaNYL  (SUBLIMAZE ) injection  200 mcg Intravenous Once   filgrastim  (NIVESTYM ) SQ  480 mcg Subcutaneous Daily   folic acid   1 mg Per Tube Daily   gabapentin   100 mg Per Tube Q12H   heparin  injection (subcutaneous)  5,000 Units Subcutaneous Q8H   [START ON 07/25/2023] heparin  injection (subcutaneous)  5,000 Units Subcutaneous Q8H   insulin  aspart  0-20 Units Subcutaneous Q4H   insulin  aspart  8 Units Subcutaneous Q4H   ipratropium-albuterol   3 mL Nebulization Q4H   lactulose   20 g Per Tube BID   [START ON 07/24/2023] lidocaine -EPINEPHrine   20 mL Intradermal Once   [START ON 07/24/2023] midazolam   5 mg Intravenous Once   midodrine  10 mg Per Tube TID WC   nicotine   7 mg Transdermal Daily   mouth rinse  15 mL Mouth Rinse Q2H   PARoxetine   40 mg Per Tube Daily   pneumococcal 20-valent conjugate vaccine  0.5 mL Intramuscular Tomorrow-1000   polyethylene glycol  17 g Per Tube Daily   QUEtiapine   50 mg Per Tube BID   revefenacin  175 mcg Nebulization Daily   [START ON 07/24/2023] rocuronium   100 mg Intravenous Once   sodium chloride  flush  10-40 mL Intracatheter Q12H   thiamine   100 mg Per Tube Daily   Continuous Infusions:  dexmedetomidine  (PRECEDEX ) IV infusion 0.5 mcg/kg/hr (07/23/23 0717)   feeding supplement (VITAL 1.5 CAL) 60 mL/hr at 07/23/23  1000   fentaNYL  infusion INTRAVENOUS 200 mcg/hr (07/23/23 1000)   heparin  10,000 units/ 20 mL infusion syringe 1,000 Units/hr (07/23/23 1034)   levofloxacin (LEVAQUIN) IV 500 mg (07/23/23 1046)   norepinephrine  (LEVOPHED ) Adult infusion 10 mcg/min (07/23/23 1000)   prismasol  BGK 4/2.5 400 mL/hr at 07/23/23 0430   prismasol  BGK 4/2.5 400 mL/hr at 07/23/23 0430   prismasol  BGK 4/2.5 1,500 mL/hr at 07/23/23 1610   sodium PHOSPHATE  IVPB (in mmol) 43 mL/hr at 07/23/23 1000   PRN Meds:.acetaminophen  (TYLENOL ) oral liquid 160 mg/5 mL **OR** acetaminophen , fentaNYL , heparin , ondansetron  (ZOFRAN ) IV, mouth rinse, oxyCODONE , polyethylene glycol, polyvinyl alcohol , sodium chloride  flush   SUBJECTIVE: Remains on crrt Temperature most part 99's Added midodrine per pulm ccm; off vasopressin   Low fio2 requirement Trach planned  No rash   Review of Systems: ROS All other ROS was negative, except mentioned above     OBJECTIVE: Vitals:   07/23/23 0700 07/23/23 0715 07/23/23 0730 07/23/23 0758  BP: 113/64 (!) 107/57 105/60  Pulse: 86 84 86   Resp: 20 18 20    Temp: 99.5 F (37.5 C) 99.5 F (37.5 C) 99.5 F (37.5 C)   TempSrc:      SpO2: 97% 98% 98% 95%  Weight:      Height:       Body mass index is 31.24 kg/m.  Physical Exam General/constitutional: intubated/sedated on pressors, crrt; ill appearing HEENT: Normocephalic, PER, Conj Clear CV: rrr no mrg Lungs: on vent 30% fio2 Abd: Soft, Nontender Ext: generalized edema Skin: No Rash Neuro: sedated MSK: no peripheral joint swelling/tenderness/warmth   Central line presence: cvc right internal jugular and groin cvc no bleeding/purulence   Lab Results Lab Results  Component Value Date   WBC 12.6 (H) 07/23/2023   HGB 8.3 (L) 07/23/2023   HCT 28.3 (L) 07/23/2023   MCV 102.2 (H) 07/23/2023   PLT 153 07/23/2023    Lab Results  Component Value Date   CREATININE 1.62 (H) 07/23/2023   BUN 33 (H) 07/23/2023   NA 128 (L)  07/23/2023   K 4.2 07/23/2023   CL 99 07/23/2023   CO2 24 07/23/2023    Lab Results  Component Value Date   ALT 14 07/23/2023   AST 19 07/23/2023   ALKPHOS 59 07/23/2023   BILITOT 0.5 07/23/2023      Microbiology: Recent Results (from the past 240 hours)  Culture, blood (Routine X 2) w Reflex to ID Panel     Status: None   Collection Time: 07/14/23  9:10 PM   Specimen: BLOOD RIGHT HAND  Result Value Ref Range Status   Specimen Description   Final    BLOOD RIGHT HAND Performed at [facility], [address], [place], [place] [address]    Special Requests   Final    BOTTLES DRAWN AEROBIC ONLY Blood Culture results may not be optimal due to an inadequate volume of blood received in culture bottles Performed at [facility], [address], [place], [place] [address]    Culture   Final    NO GROWTH 5 DAYS Performed at [facility], [address] [place], [place] [address]    Report Status 07/19/2023 FINAL  Final  Culture, blood (Routine X 2) w Reflex to ID Panel     Status: None   Collection Time: 07/14/23  9:10 PM   Specimen: BLOOD  Result Value Ref Range Status   Specimen Description   Final    BLOOD LEFT ANTECUBITAL Performed at [facility], [address], [place], [place] [address]    Special Requests   Final    BOTTLES DRAWN AEROBIC AND ANAEROBIC Blood Culture adequate volume Performed at [facility], [address], [place], [place] [address]    Culture   Final    NO GROWTH 5 DAYS Performed at [facility], [address] [place], [place] [address]    Report Status 07/19/2023 FINAL  Final  Culture, Respiratory w Gram Stain     Status: None   Collection Time: 07/16/23  2:54 PM   Specimen: Tracheal Aspirate; Respiratory  Result Value Ref Range Status   Specimen Description   Final    TRACHEAL ASPIRATE Performed at [facility], [address], [place],  [place] [address]    Special Requests   Final    NONE Performed at [facility], [address], [place], [place] [address]    Gram Stain  Final    FEW WBC PRESENT, PREDOMINANTLY PMN RARE BUDDING YEAST SEEN ABUNDANT GRAM POSITIVE COCCI IN PAIRS Performed at [facility], [address], [place], [place] [address]    Culture RARE ASPERGILLUS FUMIGATUS  Final   Report Status 07/19/2023 FINAL  Final  CSF culture w Gram Stain     Status: None   Collection Time: 07/18/23  1:32 PM   Specimen: PATH Cytology CSF; Cerebrospinal Fluid  Result Value Ref Range Status   Specimen Description   Final    CSF Performed at [facility], [address] [place], [place] [address]    Special Requests   Final    Immunocompromised Performed at [facility], [address], [place], [place] [address]    Gram Stain   Final    NO ORGANISMS SEEN RARE WBC PRESENT, PREDOMINANTLY PMN Gram Stain Report Called to,Read Back By and Verified With: RN H [facility] AT 1443 07/18/23 CRUICKSHANK A GRAM STAIN REVIEWED-AGREE WITH RESULT DRT    Culture   Final    NO GROWTH 3 DAYS Performed at [facility], [address], [place], [place] [address]    Report Status 07/22/2023 FINAL  Final  Culture, Respiratory w Gram Stain     Status: None   Collection Time: 07/20/23 11:38 AM   Specimen: Tracheal Aspirate; Respiratory  Result Value Ref Range Status   Specimen Description   Final    TRACHEAL ASPIRATE Performed at [facility], [address] [place], [place] [address]    Special Requests   Final    NONE Performed at [facility], [address], [place], [place] [address]    Gram Stain   Final    ABUNDANT WBC PRESENT, PREDOMINANTLY PMN FEW SQUAMOUS EPITHELIAL CELLS PRESENT FEW GRAM NEGATIVE RODS FEW YEAST RARE GRAM POSITIVE COCCI    Culture   Final    RARE MOLD CALL MICRO IF IDENTIFICATION  REQUIRED Performed at [facility], [address], [place], [place] [address]    Report Status 07/22/2023 FINAL  Final  Culture, blood (Routine X 2) w Reflex to ID Panel     Status: None (Preliminary result)   Collection Time: 07/21/23  1:27 AM   Specimen: BLOOD  Result Value Ref Range Status   Specimen Description   Final    BLOOD BLOOD LEFT HAND Performed at [facility], [address], [place], [place] [address]    Special Requests   Final    BOTTLES DRAWN AEROBIC ONLY Blood Culture results may not be optimal due to an inadequate volume of blood received in culture bottles Performed at [facility], [address], [place], [place] [address]    Culture   Final    NO GROWTH 2 DAYS Performed at [facility], [address], [place], [place] [address]    Report Status PENDING  Incomplete  Culture, blood (Routine X 2) w Reflex to ID Panel     Status: None (Preliminary result)   Collection Time: 07/21/23  1:27 AM   Specimen: BLOOD  Result Value Ref Range Status   Specimen Description   Final    BLOOD BLOOD LEFT HAND Performed at [facility], [address], [place], [place] [address]    Special Requests   Final    BOTTLES DRAWN AEROBIC ONLY Blood Culture results may not be optimal due to an inadequate volume of  blood received in culture bottles Performed at [facility], [address] [place], [place] [address]    Culture   Final    NO GROWTH 2 DAYS Performed at [facility], [address], [place], [place] [address]    Report Status PENDING  Incomplete  MRSA Next Gen by PCR, Nasal     Status: None   Collection Time: 07/21/23  8:08 AM   Specimen: Nasal Mucosa; Nasal Swab  Result Value Ref Range Status   MRSA by PCR Next Gen NOT DETECTED NOT DETECTED Final    Comment: (NOTE) The GeneXpert MRSA Assay (FDA approved for NASAL specimens only), is one component of a comprehensive  MRSA colonization surveillance program. It is not intended to diagnose MRSA infection nor to guide or monitor treatment for MRSA infections. Test performance is not FDA approved in patients less than 77 years old. Performed at [facility], [address], [place], [place] [address]   Pneumocystis smear by DFA     Status: None   Collection Time: 07/21/23  3:41 PM   Specimen: Tracheal Aspirate; Respiratory  Result Value Ref Range Status   Specimen Source-PJSRC TRACHEAL ASPIRATE  Final   Pneumocystis jiroveci Ag See Scanned report in [facility]  Final    Comment: Performed at [facility], [address], [place], [place] [address]     Serology:   Imaging: If present, new imagings (plain films, ct scans, and mri) have been personally visualized and interpreted; radiology reports have been reviewed. Decision making incorporated into the Impression / Recommendations.  5/2 dvt duplex bilateral lower ext, negative  5/4 chest ct noncontrast 1. Bilateral lower lobe airspace consolidation compatible with pneumonia. 2. Multifocal patchy airspace areas and ill-defined ground-glass slightly nodular opacities throughout the bilateral upper lobes and minimally in the right middle lobe, also likely infectious/inflammatory. 3. Small bilateral pleural effusions. 4. Mild cardiomegaly. 5. Cholelithiasis. 6. Scattered lucent lesions throughout the thoracic spine measuring up to 8 mm, nonspecific. Findings may be related to multiple myeloma or metastatic disease. 7. Minimal compression deformity of the superior endplate of T11.  5/2 mri brain No acute intracranial abnormality.   Nonspecific white matter signal abnormality likely reflecting moderate chronic microvascular ischemic changes.   Multiple calvarial lesions corresponding to lytic lesions on CT. Findings concerning for multiple myeloma versus metastasis.   1.5 cm cystic lesion in the  subcutaneous tissues overlying the nasal bones likely reflecting epidermoid cyst versus dermoid. No evidence of connection to the intracranial compartment.   Large right mastoid effusion.    4/21 tte  1. Left ventricular ejection fraction, by estimation, is 65 to 70%. The  left ventricle has normal function. The left ventricle has no regional  wall motion abnormalities. Left ventricular diastolic parameters are  indeterminate.   2. Right ventricular systolic function is normal. The right ventricular  size is normal. There is normal pulmonary artery systolic pressure. The  estimated right ventricular systolic pressure is 35.8 mmHg.   3. Left atrial size was moderately dilated.   4. The mitral valve is normal in structure. No evidence of mitral valve  regurgitation. No evidence of mitral stenosis.   5. The aortic valve is tricuspid. Aortic valve regurgitation is not  visualized. Aortic valve sclerosis/calcification is present, without any  evidence of aortic stenosis. Aortic valve mean gradient measures 8.0 mmHg.   6. Aortic dilatation noted. There is mild dilatation of the aortic root,  measuring 39 mm.  7. The inferior vena cava is dilated in size with <50% respiratory  variability, suggesting right atrial pressure of 15 mmHg.   8. The patient was in atrial fibrillation.    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   Jamesetta Mcbride, MD [facility] [phone] pager    07/23/2023, 10:50 AM

## 2023-07-23 NOTE — Progress Notes (Signed)
 Nutrition Follow-up  DOCUMENTATION CODES:   Not applicable  INTERVENTION:  - Continue goal TF: Vital 1.5 at 60 ml/h (1440 ml per day) Prosource TF20 60 ml TID Provides 2400 kcal, 157 gm protein, 1100 ml free water  daily   - FWF per CCM/MD.    - Monitor weight trends.   NUTRITION DIAGNOSIS:   Inadequate oral intake related to inability to eat as evidenced by NPO status. *ongoing  GOAL:   Patient will meet greater than or equal to 90% of their needs *met with TF  MONITOR:   Vent status, Labs, Weight trends, TF tolerance  REASON FOR ASSESSMENT:   Consult Assessment of nutrition requirement/status  ASSESSMENT:   58 y.o. male with PMH of obesity, asthma, OSA on CPAP, GERD, HTN, HLD who presented due to feeling weak with AMS for a few days. Admitted with concern for sepsis, community-acquired pneumonia, encephalopathy as well as concern for multiple myeloma.  4/17 Admit 4/18 Regular textured diet 4/20 Cortrak attempted but unable to be placed 4/21 SLP eval - NPO; IR attempted Cortrak but unable to be placed; CCM MD successfully placed NGT; TF started 4/22 Concern for aspiration overnight and TF stopped; Intubated; TF restarted in the evening 4/23 CRRT initiated 5/6 emesis and aspiration event, TF paused but restarted back at trickle with slow advancements back to goal 5/7 back on goal TF  Patient is currently intubated on ventilator support MV: 10.5 L/min Temp (24hrs), Avg:99.2 F (37.3 C), Min:98.4 F (36.9 C), Max:100.6 F (38.1 C)  Patient had emesis and aspiration event yesterday. TF's held afterwards but able to be restarted at trickles later in the afternoon and then titrated back to goal. TF's at goal of 53mL/hr this AM. No further intolerances noted.  Patient remains on CRRT.  Family have decided to pursue trach. Patient scheduled for trach tomorrow, 5/8. NPO at midnight. Per CCM notes, family agreeable to PEG but plan to hold off for now and continue  Cortrak.      Admit weight: 243# Current weight: 230# I&O's: +6.9L since 4/23   Medications reviewed and include: Colace BID, Miralax , Lactulose  BID, 1mg  folic acid , 100mg  thiamine  Precedex  Fentanyl  Levo @ 9 mcg/min  Labs reviewed:  Na 128 Creatinine 1.62 Phosphorus 1.9 Magnesium  2.6 HA1C 6.7 (as of 01/29/23) Blood Glucose 88-155 x24 hours  Diet Order:   Diet Order             Diet NPO time specified  Diet effective midnight           Diet NPO time specified  Diet effective midnight                   EDUCATION NEEDS:  Not appropriate for education at this time  Skin:  Skin Assessment: Skin Integrity Issues: Skin Integrity Issues:: DTI DTI: Left Buttocks Unstageable: Left and Right Heel  Last BM:  5/7 - rectal tube  Height:  Ht Readings from Last 1 Encounters:  07/22/23 6' 0.01" (1.829 m)   Weight:  Wt Readings from Last 1 Encounters:  07/23/23 104.5 kg   Ideal Body Weight:  86.36 kg  BMI:  Body mass index is 31.24 kg/m.  Estimated Nutritional Needs:  Kcal:  2350-2550 kcals Protein:  145-175 grams Fluid:  >/= 2.3L    Scheryl Cushing RD, LDN Contact via Secure Chat.

## 2023-07-23 NOTE — Progress Notes (Signed)
 Daily Progress Note   Patient Name: Frank [name].       Date: 07/23/2023 DOB: 1965-09-18  Age: 58 y.o. MRN#: [id number] Attending Physician: Maire Scot, MD Primary Care Physician: Serita Danes, MD Admit Date: 07/03/2023  Reason for Consultation/Follow-up: Establishing goals of care  Patient Profile/HPI:  58 y.o. male  with past medical history of OSA on CPAP, GERD, HTN, HLD, DM2, BPH, seizure disorder admitted on 07/03/2023 as a transfer from [facility] health due to AMS. Workup revealed severe sepsis due to pneumonia and metabolic encephalopathy. He also has been newly diagnosed with multiple myeloma this admission and treatment started with Velcade  and Cytoxan . Admission has been complicated by pancytopenias, septic shock due to pneumonia with respiratory failure requiring intubation (intubated on 4/22), kidney injury- now on CRRT, is anuric. He is currently on vasopressor support.  Palliative medicine consulted for GOC.   Subjective: Chart reviewed including labs, progress notes, imaging from this and previous encounters.  Discussed with bedside RN and respiratory therapy. Unable to wean today. Reviewed medication use- continues on IV pressor support, IV fentanyl , IV precedex . Infectious disease note reviewed- antimicrobials changed. Patient continues to have fevers. I&O reviewed- not making urine.  Met with patient's spouse- Moira Andrews and family friend. Eston Hence, NP also present.  Discussed patient's current acute illness and possible long term trajectories. Goals for quality of life and aggressiveness for medical interventions were reviewed.  Patient's spouse endorses continued aggressive medical interventions including trach/PEG as long as there is a chance that patient will  recover his mental status and be able to interact with family. Being bedbound and dependent for care would likely be acceptable, but Moira Andrews will continue to think about this. Needing skilled facility placement would also be acceptable.  Code status reviewed- Moira Andrews requests at least one code. She is an EMT and aware of what a code involves.  Plan made for trach tomorrow and to re-evaluate patient's status next week with the understanding that he has many barriers to overcome including getting off vasopressor support and improvement in his mental status. We discussed if unable to overcome these, then would need to discuss what transition to comfort measures would look like.  Moira Andrews also noted that patient would want to attempt organ donation and donation of his body to science if he were to  die.   Review of Systems  Unable to perform ROS: Intubated     Physical Exam Vitals and nursing note reviewed.  Constitutional:      Appearance: He is ill-appearing.  Cardiovascular:     Rate and Rhythm: Normal rate.  Pulmonary:     Comments: intubated Neurological:     Comments: sedated             Vital Signs: BP 115/67   Pulse 89   Temp 99 F (37.2 C)   Resp (!) 21   Ht 6' 0.01" (1.829 m)   Wt 104.5 kg   SpO2 100%   BMI 31.24 kg/m  SpO2: SpO2: 100 % O2 Device: O2 Device: Ventilator O2 Flow Rate: O2 Flow Rate (L/min): 15 L/min  Intake/output summary:  Intake/Output Summary (Last 24 hours) at 07/23/2023 1648 Last data filed at 07/23/2023 1500 Gross per 24 hour  Intake 3067.27 ml  Output 3938 ml  Net -870.73 ml   LBM: Last BM Date : 07/23/23 Baseline Weight: Weight: 110.6 kg Most recent weight: Weight: 104.5 kg       Palliative Assessment/Data: PPS: 10% (with PEG)      Patient Active Problem List   Diagnosis Date Noted   Lobar pneumonia, unspecified organism (HCC) 07/21/2023   On mechanically assisted ventilation (HCC) 07/19/2023   Acute metabolic encephalopathy  07/19/2023   Acute encephalopathy 07/14/2023   Severe sepsis with septic shock (HCC) 07/14/2023   PAF (paroxysmal atrial fibrillation) (HCC) 07/09/2023   Acute respiratory failure with hypoxia (HCC) 07/08/2023   Atrial fibrillation with rapid ventricular response (HCC) 07/07/2023   AKI (acute kidney injury) (HCC) 07/07/2023   Multiple myeloma not having achieved remission (HCC) 07/04/2023   Generalized weakness 07/03/2023   Closed fracture of left distal humerus 01/29/2023   Status post total left knee replacement 11/23/2021   Unilateral primary osteoarthritis, right knee 01/25/2019   Carpal tunnel syndrome, left upper limb 10/15/2018   Carpal tunnel syndrome, right upper limb 10/15/2018   Bilateral hand numbness 09/09/2018   Plantar fasciitis of left foot 09/09/2018   Status post total replacement of left hip 12/26/2017   Unilateral primary osteoarthritis, left knee 11/12/2017   Hypogonadism, male 05/08/2016    Palliative Care Assessment & Plan    Assessment/Recommendations/Plan  Continue current interventions Full scope, full code, trach/PEG ok   Code Status:   Code Status: Full Code   Prognosis:  Unable to determine  Discharge Planning: To Be Determined  Care plan was discussed with patient's spouse and Carlon Chester, NP.   Thank you for allowing the Palliative Medicine Team to assist in the care of this patient.  Total time:  60 minutes Prolonged billing:  Time includes:   Preparing to see the patient (e.g., review of tests) Obtaining and/or reviewing separately obtained history Performing a medically necessary appropriate examination and/or evaluation Counseling and educating the patient/family/caregiver Ordering medications, tests, or procedures Referring and communicating with other health care professionals (when not reported separately) Documenting clinical information in the electronic or other health record Independently interpreting results (not reported  separately) and communicating results to the patient/family/caregiver Care coordination (not reported separately) Clinical documentation  Micki Alas, AGNP-C Palliative Medicine   Please contact Palliative Medicine Team phone at [phone] for questions and concerns.

## 2023-07-23 NOTE — IPAL (Signed)
  Interdisciplinary Goals of Care Family Meeting   Date carried out: 07/23/2023  Location of the meeting: Conference room  Member's involved: Nurse Practitioner, Family Member or next of kin, and Palliative care team member  Durable Power of Attorney or acting medical decision maker: Wife Frank [name]     Discussion: We discussed goals of care for Frank [name]. Frank [name]  Frank [name] with palliative care and I met with Frank [name] and a close friend Frank [name].   We talked about Frank [name]'s clinical case-- current tx and what lies ahead. Plan is for trach 5/8 and hopefully sedation weaning. Frank [name] is hopeful for ongoing progress.  We also talked about more difficult possible trajectories such as inability to liberate from vasoactive meds / RRT / vent.  She indicates she would want to give him chance for improvement but would not want him to suffer if he weren't going to have a meaningful recovery. We talked about what that looks like, and at minimum it would mean being able to have meaningful interpersonal conversation, but Frank [name] is going to continue to think of other QOL goals.   Frank [name] would look to the care team for transparency regarding his overall prognosis and if we reach a point he cannot meaningfully recover from. She knows this is a possibility, but is channeling optimism and aggressive efforts at present -- especially as their daughter's wedding / arrival of grandson approaches.   We talked about code status. 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 She would want Frank [name] to have resuscitative efforts attempted at least for one round.   After our meeting I brought Frank [name] and her friend to pts room. She has signed consent form for tomorrow's trach+bronch. It has been placed in chart for proceduralists to sign tomorrow.   Code status:   Code Status: Full Code   Disposition: Continue current acute  care  Time spent for the meeting: 57 min     Delories Fetter, NP  07/23/2023, 5:04 PM

## 2023-07-23 NOTE — Plan of Care (Signed)

## 2023-07-24 ENCOUNTER — Inpatient Hospital Stay (HOSPITAL_COMMUNITY)

## 2023-07-24 DIAGNOSIS — R609 Edema, unspecified: Secondary | ICD-10-CM

## 2023-07-24 DIAGNOSIS — J90 Pleural effusion, not elsewhere classified: Secondary | ICD-10-CM | POA: Diagnosis not present

## 2023-07-24 DIAGNOSIS — J9601 Acute respiratory failure with hypoxia: Secondary | ICD-10-CM | POA: Diagnosis not present

## 2023-07-24 DIAGNOSIS — A419 Sepsis, unspecified organism: Secondary | ICD-10-CM | POA: Diagnosis not present

## 2023-07-24 DIAGNOSIS — C9000 Multiple myeloma not having achieved remission: Secondary | ICD-10-CM

## 2023-07-24 DIAGNOSIS — Z7189 Other specified counseling: Secondary | ICD-10-CM | POA: Diagnosis not present

## 2023-07-24 DIAGNOSIS — R531 Weakness: Secondary | ICD-10-CM | POA: Diagnosis not present

## 2023-07-24 DIAGNOSIS — E1165 Type 2 diabetes mellitus with hyperglycemia: Secondary | ICD-10-CM

## 2023-07-24 DIAGNOSIS — G9341 Metabolic encephalopathy: Secondary | ICD-10-CM | POA: Diagnosis not present

## 2023-07-24 LAB — CBC
HCT: 29.9 % — ABNORMAL LOW (ref 39.0–52.0)
Hemoglobin: 8.9 g/dL — ABNORMAL LOW (ref 13.0–17.0)
MCH: 30.2 pg (ref 26.0–34.0)
MCHC: 29.8 g/dL — ABNORMAL LOW (ref 30.0–36.0)
MCV: 101.4 fL — ABNORMAL HIGH (ref 80.0–100.0)
Platelets: 177 10*3/uL (ref 150–400)
RBC: 2.95 MIL/uL — ABNORMAL LOW (ref 4.22–5.81)
RDW: 20.5 % — ABNORMAL HIGH (ref 11.5–15.5)
WBC: 26.1 10*3/uL — ABNORMAL HIGH (ref 4.0–10.5)
nRBC: 2.4 % — ABNORMAL HIGH (ref 0.0–0.2)

## 2023-07-24 LAB — RENAL FUNCTION PANEL
Albumin: 1.6 g/dL — ABNORMAL LOW (ref 3.5–5.0)
Albumin: 1.6 g/dL — ABNORMAL LOW (ref 3.5–5.0)
Anion gap: 7 (ref 5–15)
Anion gap: 6 (ref 5–15)
BUN: 33 mg/dL — ABNORMAL HIGH (ref 6–20)
BUN: 30 mg/dL — ABNORMAL HIGH (ref 6–20)
CO2: 24 mmol/L (ref 22–32)
CO2: 25 mmol/L (ref 22–32)
Calcium: 7.4 mg/dL — ABNORMAL LOW (ref 8.9–10.3)
Calcium: 7.5 mg/dL — ABNORMAL LOW (ref 8.9–10.3)
Chloride: 101 mmol/L (ref 98–111)
Chloride: 104 mmol/L (ref 98–111)
Creatinine, Ser: 1.59 mg/dL — ABNORMAL HIGH (ref 0.61–1.24)
Creatinine, Ser: 1.53 mg/dL — ABNORMAL HIGH (ref 0.61–1.24)
GFR, Estimated: 50 mL/min — ABNORMAL LOW (ref >60)
GFR, Estimated: 52 mL/min — ABNORMAL LOW (ref >60)
Glucose, Bld: 140 mg/dL — ABNORMAL HIGH (ref 70–99)
Glucose, Bld: 126 mg/dL — ABNORMAL HIGH (ref 70–99)
Phosphorus: 2 mg/dL — ABNORMAL LOW (ref 2.5–4.6)
Phosphorus: 3.2 mg/dL (ref 2.5–4.6)
Potassium: 4.3 mmol/L (ref 3.5–5.1)
Potassium: 4.3 mmol/L (ref 3.5–5.1)
Sodium: 132 mmol/L — ABNORMAL LOW (ref 135–145)
Sodium: 135 mmol/L (ref 135–145)

## 2023-07-24 LAB — PROTEIN ELECTROPHORESIS, SERUM
A/G Ratio: 0.4 — ABNORMAL LOW (ref 0.7–1.7)
Albumin ELP: 2 g/dL — ABNORMAL LOW (ref 2.9–4.4)
Alpha-1-Globulin: 0.6 g/dL — ABNORMAL HIGH (ref 0.0–0.4)
Alpha-2-Globulin: 0.8 g/dL (ref 0.4–1.0)
Beta Globulin: 0.8 g/dL (ref 0.7–1.3)
Gamma Globulin: 3.3 g/dL — ABNORMAL HIGH (ref 0.4–1.8)
Globulin, Total: 5.6 g/dL — ABNORMAL HIGH (ref 2.2–3.9)
M-Spike, %: 3.1 g/dL — ABNORMAL HIGH
Total Protein ELP: 7.6 g/dL (ref 6.0–8.5)

## 2023-07-24 LAB — GLUCOSE, CAPILLARY
Glucose-Capillary: 103 mg/dL — ABNORMAL HIGH (ref 70–99)
Glucose-Capillary: 105 mg/dL — ABNORMAL HIGH (ref 70–99)
Glucose-Capillary: 113 mg/dL — ABNORMAL HIGH (ref 70–99)
Glucose-Capillary: 131 mg/dL — ABNORMAL HIGH (ref 70–99)
Glucose-Capillary: 137 mg/dL — ABNORMAL HIGH (ref 70–99)
Glucose-Capillary: 141 mg/dL — ABNORMAL HIGH (ref 70–99)
Glucose-Capillary: 158 mg/dL — ABNORMAL HIGH (ref 70–99)

## 2023-07-24 LAB — TRIGLYCERIDES: Triglycerides: 132 mg/dL (ref <150)

## 2023-07-24 LAB — MAGNESIUM: Magnesium: 2.6 mg/dL — ABNORMAL HIGH (ref 1.7–2.4)

## 2023-07-24 LAB — APTT: aPTT: 45 s — ABNORMAL HIGH (ref 24–36)

## 2023-07-24 LAB — PHOSPHORUS: Phosphorus: 1.5 mg/dL — ABNORMAL LOW (ref 2.5–4.6)

## 2023-07-24 MED ORDER — OXIDIZED CELLULOSE EX PADS
1.0000 | MEDICATED_PAD | Freq: Once | CUTANEOUS | Status: AC
  Administered 2023-07-24: 1 via TOPICAL
  Filled 2023-07-24: qty 1

## 2023-07-24 MED ORDER — DEXMEDETOMIDINE HCL IN NACL 200 MCG/50ML IV SOLN
0.0000 ug/kg/h | INTRAVENOUS | Status: DC
Start: 2023-07-25 — End: 2023-07-26
  Administered 2023-07-24 – 2023-07-25 (×2): 1.2 ug/kg/h via INTRAVENOUS
  Administered 2023-07-25: 0.8 ug/kg/h via INTRAVENOUS
  Administered 2023-07-25 (×2): 0.7 ug/kg/h via INTRAVENOUS
  Administered 2023-07-25: 0.8 ug/kg/h via INTRAVENOUS
  Administered 2023-07-25 (×2): 1.2 ug/kg/h via INTRAVENOUS
  Administered 2023-07-25: 1.1 ug/kg/h via INTRAVENOUS
  Administered 2023-07-25 – 2023-07-26 (×9): 1.2 ug/kg/h via INTRAVENOUS
  Filled 2023-07-24 (×9): qty 50
  Filled 2023-07-24: qty 100
  Filled 2023-07-24: qty 50
  Filled 2023-07-24: qty 100
  Filled 2023-07-24 (×3): qty 50

## 2023-07-24 MED ORDER — SODIUM PHOSPHATES 45 MMOLE/15ML IV SOLN
30.0000 mmol | Freq: Once | INTRAVENOUS | Status: AC
  Administered 2023-07-24: 30 mmol via INTRAVENOUS
  Filled 2023-07-24: qty 10

## 2023-07-24 MED ORDER — ISOPROPYL ALCOHOL 70 % SOLN
Freq: Once | Status: AC
Start: 2023-07-24 — End: 2023-07-24
  Filled 2023-07-24: qty 480

## 2023-07-24 MED ORDER — PHENYLEPHRINE 80 MCG/ML (10ML) SYRINGE FOR IV PUSH (FOR BLOOD PRESSURE SUPPORT)
PREFILLED_SYRINGE | INTRAVENOUS | Status: AC
  Filled 2023-07-24: qty 10

## 2023-07-24 NOTE — Progress Notes (Signed)
 SLP Note  Patient Details Name: Frank [name]. MRN: [id number] DOB: [birth year]  Patient with new tracheostomy. Orders for SLP eval and treat for PMSV and swallowing received. Will follow pt closely for readiness for SLP interventions as appropriate.   Jacqualine Mater, MA, CCC-SLP Speech Therapy

## 2023-07-24 NOTE — Procedures (Addendum)
 Percutaneous Tracheostomy Procedure Note   Frank [name]  [id number]  1966-02-18  Date:07/24/23  Time:1:44 PM   Provider Performing:Jshon Ibe  Procedure: Percutaneous Tracheostomy with Bronchoscopic Guidance (11914)  Indication(s) Acute respiratory failure  Consent Risks of the procedure as well as the alternatives and risks of each were explained to the patient and/or caregiver.  Consent for the procedure was obtained.  Anesthesia Etomidate , Versed , Fentanyl , Rocuronium    Time Out Verified patient identification, verified procedure, site/side was marked, verified correct patient position, special equipment/implants available, medications/allergies/relevant history reviewed, required imaging and test results available.   Sterile Technique Maximal sterile technique including sterile barrier drape, hand hygiene, sterile gown, sterile gloves, mask, hair covering.    Procedure Description Appropriate anatomy identified by palpation.  Patient's neck prepped and draped in sterile fashion.  1% lidocaine  with epinephrine  was used to anesthetize skin overlying neck.  1.5cm incision made and blunt dissection performed until tracheal rings could be easily palpated.   Then a size 6 Shiley tracheostomy was placed under bronchoscopic visualization using usual Seldinger technique and serial dilation.   Bronchoscope confirmed placement above the carina.  Tracheostomy was sutured in place with adhesive pad to protect skin under pressure.    Patient connected to ventilator.   Complications/Tolerance None; patient tolerated the procedure well. Chest X-ray is ordered to confirm no post-procedural complication.   EBL Minimal   Specimen(s) None

## 2023-07-24 NOTE — Progress Notes (Signed)
 eLink Physician-Brief Progress Note Patient Name: Frank [name]. DOB: 09/08/1965 MRN: [id number]   Date of Service  07/24/2023  HPI/Events of Note  Patient with oozing from his fresh tracheostomy site dressing, he was recently on full dose Heparin  anti-coagulation for atrial fibrillation but the Heparin  is currently on hold.  eICU Interventions  Will try thrombin pad + gauze pressure dressing as a first step, and if bleeding persists will ask the ground crew to evaluate at bedside.        Evely Gainey U Aeon Koors 07/24/2023, 7:41 PM

## 2023-07-24 NOTE — Progress Notes (Signed)
 Frank [name].   DOB:1965-04-09   ZO#:[id number]      ASSESSMENT & PLAN:  Multiple myeloma - Patient transferred from [facility] mid April 2025 with history of generalized weakness, decreased appetite and altered mental status.  Lytic lesions concerning for multiple myeloma. 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 - Initiated chemotherapy with Velcade  and Cytoxan  07/08/23 with second dose given 07/11/23.  Also given high-dose steroids.  Patient has had good response to chemotherapy.  Normal levels have decreased. - Chemotherapy on Hold.   - Medical oncology/Dr. 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 Recommend PRBC transfusion for hemoglobin <7.0 - Monitor CBC with differential closely   AKI - Creatinine 1.59 - Remains on CRRT - Continue per nephrology   Altered mental status - Admitted with agitation and confusion.  Remains intubated. - Continue supportive care   Hypertension Hyperlipidemia Diabetes - Monitor blood pressure levels - Monitor blood glucose levels, insulin  coverage per protocol - Medicine following    Code Status Full  Subjective:  Patient seen laying in bed.  Remains ventilated and sedated.  His color actually looks much better.  Remains on CRRT.  Discussed case with nurse.  Objective:  Vitals:   07/24/23 0742 07/24/23 0800  BP:  112/68  Pulse:  83  Resp:  20  Temp:  98.8 F (37.1 C)  SpO2: 98% 97%     Intake/Output Summary (Last 24 hours) at 07/24/2023 1044 Last data filed at 07/24/2023 1000 Gross per 24 hour  Intake 2360.38 ml  Output 3921.9 ml  Net -1561.52 ml     PHYSICAL EXAMINATION: ECOG PERFORMANCE STATUS: 4 - Bedbound  Vitals:   07/24/23 0742 07/24/23 0800  BP:  112/68  Pulse:  83  Resp:   20  Temp:  98.8 F (37.1 C)  SpO2: 98% 97%   Filed Weights   07/22/23 0500 07/23/23 0500 07/24/23 0600  Weight: 231 lb 7.7 oz (105 kg) 230 lb 6.1 oz (104.5 kg) 228 lb 2.8 oz (103.5 kg)    GENERAL: + Intubated + intubated SKIN: + Improving skin color  EYES: normal, conjunctiva are pink and non-injected, sclera clear OROPHARYNX: + Intubated NECK: supple, thyroid normal size, non-tender, without nodularity LYMPH: no palpable lymphadenopathy in the cervical, axillary or inguinal LUNGS: clear to auscultation and percussion with normal breathing effort HEART: regular rate & rhythm and no murmurs and no lower extremity edema ABDOMEN: abdomen soft, non-tender and normal bowel sounds MUSCULOSKELETAL: no cyanosis of digits and no clubbing  PSYCH: + Intubated     All questions were answered. The patient knows to call the clinic with any problems, questions or concerns.   The total time spent in the appointment was 40 minutes encounter with patient including review of chart and various tests results, discussions about plan of care and coordination of care plan  Jacqualin Mate, NP 07/24/2023 10:44 AM    Labs Reviewed:  Lab Results  Component Value Date   WBC 26.1 (H) 07/24/2023   HGB 8.9 (L) 07/24/2023   HCT 29.9 (L) 07/24/2023   MCV 101.4 (H) 07/24/2023   PLT 177 07/24/2023   Recent Labs    07/19/23 0405 07/19/23 1641 07/20/23 0530 07/20/23 1656 07/23/23 0416 07/23/23 1722 07/24/23 0421  NA 132*  130*   < >  130*   < > 128* 133* 132*  K 4.9  4.7   < > 4.5   < > 4.2 4.9 4.3  CL 100  97*   < > 100   < > 99 102 101  CO2 23  22   < > 24   < > 24 25 24   GLUCOSE 223*  216*   < > 137*   < > 128* 91 140*  BUN 71*  71*   < > 52*   < > 33* 31* 33*  CREATININE 3.21*  3.18*   < > 2.25*   < > 1.62* 1.67* 1.59*  CALCIUM  7.1*  7.1*   < > 7.2*   < > 7.5* 7.4* 7.4*  GFRNONAA 21*  22*   < > 33*   < > 49* 47* 50*  PROT 8.8*  --  8.2*  --  8.5*  --   --   ALBUMIN  1.8*  1.9*   < >  1.7*   < > <1.5*  <1.5* 1.6* 1.6*  AST 14*  --  9*  --  19  --   --   ALT 34  --  24  --  14  --   --   ALKPHOS 37*  --  39  --  59  --   --   BILITOT 0.7  --  0.6  --  0.5  --   --   BILIDIR  --   --   --   --  <0.1  --   --   IBILI  --   --   --   --  NOT CALCULATED  --   --    < > = values in this interval not displayed.    Studies Reviewed:  DG CHEST PORT 1 VIEW Result Date: 07/22/2023 CLINICAL DATA:  Aspiration pneumonia. EXAM: PORTABLE CHEST 1 VIEW COMPARISON:  Jul 18, 2023. FINDINGS: Stable cardiomediastinal silhouette. Tracheostomy and feeding tubes are unchanged. Right internal jugular catheter is unchanged. Increased bilateral lung opacities are noted diffusely consistent with edema or atypical inflammation. Severe degenerative changes are seen involving both glenohumeral joints. IMPRESSION: Stable support apparatus. Increased bilateral lung opacities concerning for edema or pneumonia. Electronically Signed   By: Rosalene Colon M.D.   On: 07/22/2023 14:39   DG Abd Portable 1V Result Date: 07/22/2023 CLINICAL DATA:  Ileus. EXAM: PORTABLE ABDOMEN - 1 VIEW COMPARISON:  July 07, 2023. FINDINGS: The bowel gas pattern is normal. Distal tip of feeding tube is seen in expected position of proximal stomach. No radio-opaque calculi or other significant radiographic abnormality are seen. IMPRESSION: No abnormal bowel dilatation. Electronically Signed   By: Rosalene Colon M.D.   On: 07/22/2023 14:38   CT CHEST WO CONTRAST Result Date: 07/20/2023 CLINICAL DATA:  Sepsis EXAM: CT CHEST WITHOUT CONTRAST TECHNIQUE: Multidetector CT imaging of the chest was performed following the standard protocol without IV contrast. RADIATION DOSE REDUCTION: This exam was performed according to the departmental dose-optimization program which includes automated exposure control, adjustment of the mA and/or kV according to patient size and/or use of iterative reconstruction technique. COMPARISON:  None Available.  FINDINGS: Cardiovascular: No significant vascular findings. The heart is mildly enlarged. There is no pericardial effusion. Aorta is normal in size. Central venous catheter tip ends in the SVC. Mediastinum/Nodes: Endotracheal tube tip is 2.4 cm above the carina. Enteric tube is seen throughout nondilated esophagus. No enlarged lymph nodes are identified allowing for lack  of intravenous contrast. The visualized thyroid gland is within normal limits. Lungs/Pleura: There are small bilateral pleural effusions. There is bilateral lower lobe airspace consolidation. Multifocal patchy airspace areas and ill-defined ground-glass slightly nodular opacities are seen throughout the bilateral upper lobes and minimally in the right middle lobe, also likely infectious/inflammatory. There is no pneumothorax. Upper Abdomen: Gallstones are present. Enteric tube is partially visualized in the stomach. Musculoskeletal: There are severe degenerative changes in both shoulders. The bones are diffusely osteopenic. There are scattered lucent lesions throughout the thoracic spine measuring up to 8 mm. There is minimal compression deformity of the superior endplate of T11. IMPRESSION: 1. Bilateral lower lobe airspace consolidation compatible with pneumonia. 2. Multifocal patchy airspace areas and ill-defined ground-glass slightly nodular opacities throughout the bilateral upper lobes and minimally in the right middle lobe, also likely infectious/inflammatory. 3. Small bilateral pleural effusions. 4. Mild cardiomegaly. 5. Cholelithiasis. 6. Scattered lucent lesions throughout the thoracic spine measuring up to 8 mm, nonspecific. Findings may be related to multiple myeloma or metastatic disease. 7. Minimal compression deformity of the superior endplate of T11. Electronically Signed   By: Tyron Gallon M.D.   On: 07/20/2023 16:31   VAS US  LOWER EXTREMITY VENOUS (DVT) Result Date: 07/18/2023  Lower Venous DVT Study Patient Name:  Harutyun Alverson.  Date of Exam:   07/18/2023 Medical Rec #: [id number]           Accession #:    [id number] Date of Birth: July 09, 1965           Patient Gender: M Patient Age:   58 years Exam Location:  [facility] Procedure:      VAS US  LOWER EXTREMITY VENOUS (DVT) Referring Phys: ADITYA PALIWAL --------------------------------------------------------------------------------  Indications: Swelling, and Edema.  Risk Factors: Immobility Cancer myeloma. Limitations: Patient positioning. Comparison Study: None. Performing Technologist: Estanislao Heimlich  Examination Guidelines: A complete evaluation includes B-mode imaging, spectral Doppler, color Doppler, and power Doppler as needed of all accessible portions of each vessel. Bilateral testing is considered an integral part of a complete examination. Limited examinations for reoccurring indications may be performed as noted. The reflux portion of the exam is performed with the patient in reverse Trendelenburg.  +-----+---------------+---------+-----------+----------+--------------+ RIGHTCompressibilityPhasicitySpontaneityPropertiesThrombus Aging +-----+---------------+---------+-----------+----------+--------------+ CFV  Full           Yes      Yes                                 +-----+---------------+---------+-----------+----------+--------------+ Large hematoma noted at top of right leg  +---------+---------------+---------+-----------+----------+-------------------+ LEFT     CompressibilityPhasicitySpontaneityPropertiesThrombus Aging      +---------+---------------+---------+-----------+----------+-------------------+ CFV      Full           Yes      Yes                                      +---------+---------------+---------+-----------+----------+-------------------+ SFJ      Full                                                             +---------+---------------+---------+-----------+----------+-------------------+ FV Prox  Full                                                              +---------+---------------+---------+-----------+----------+-------------------+  FV Mid   Full                                                             +---------+---------------+---------+-----------+----------+-------------------+ FV DistalFull                                                             +---------+---------------+---------+-----------+----------+-------------------+ PFV      Full                                                             +---------+---------------+---------+-----------+----------+-------------------+ POP      Full           Yes      Yes                                      +---------+---------------+---------+-----------+----------+-------------------+ PTV                              Yes                  Not well visualized +---------+---------------+---------+-----------+----------+-------------------+ PERO                             Yes                  Not well visualized +---------+---------------+---------+-----------+----------+-------------------+     Summary: RIGHT: - No evidence of common femoral vein obstruction.   LEFT: - There is no evidence of deep vein thrombosis in the lower extremity.  - No cystic structure found in the popliteal fossa.  *See table(s) above for measurements and observations. Electronically signed by Delaney Fearing on 07/18/2023 at 4:52:00 PM.    Final    DG Chest 1 View Result Date: 07/18/2023 CLINICAL DATA:  Endotracheal tube. EXAM: CHEST  1 VIEW COMPARISON:  Jul 17, 2023. FINDINGS: Endotracheal and feeding tube are in grossly good position. Right internal jugular catheter is unchanged. Mild bibasilar subsegmental atelectasis is noted with small pleural effusions. Degenerative changes are seen involving both shoulder joints. IMPRESSION: Support apparatus as noted above. Bibasilar atelectasis and probable small pleural effusions are noted.  Electronically Signed   By: Rosalene Colon M.D.   On: 07/18/2023 15:58   DG FL GUIDED LUMBAR PUNCTURE Result Date: 07/18/2023 CLINICAL DATA:  Metabolic encephalopathy EXAM: DIAGNOSTIC LUMBAR PUNCTURE UNDER FLUOROSCOPIC GUIDANCE COMPARISON:  I assessed the brain imaging of 07/17/2023 and the CT abdomen of 07/08/2023 in preparation for this exam. FLUOROSCOPY: Radiation Exposure Index (as provided by the fluoroscopic device): 3.4 mGy Kerma PROCEDURE: The risks (including hemorrhage, infection, and nerve damage, among others), benefits, and alternatives to fluoroscopically guided lumbar puncture were discussed with the patient's wife by telephone, as the patient is  unable to consent and has altered mental status. The patient's wife understood and elected for the patient to undergo the procedure. Standard time-out was employed. Following sterile skin prep and local anesthetic administration consisting of 1 percent lidocaine , a 22 gauge spinal needle was advanced without difficulty into the thecal sac at the L2-3 level under fluoroscopic guidance. Clear CSF was returned. Because the patient is intubated, I did not turn him onto his side to obtain a pressure measurement. A total of 11.5 cc of clear CSF was collected in 4 vials. The needle was subsequently removed and the skin cleansed and bandaged. No immediate complications were observed. IMPRESSION: Technically successful fluoroscopically guided lumbar puncture at the L2-3 level. Electronically Signed   By: Freida Jes M.D.   On: 07/18/2023 13:39   MR BRAIN WO CONTRAST Result Date: 07/18/2023 CLINICAL DATA:  Delirium EXAM: MRI HEAD WITHOUT CONTRAST TECHNIQUE: Multiplanar, multiecho pulse sequences of the brain and surrounding structures were obtained without intravenous contrast. COMPARISON:  CT head 07/17/2023. FINDINGS: Brain: No acute infarct. Punctate susceptibility in the left corona radiata suggestive of chronic microhemorrhage. No additional foci of  hemorrhage appreciated. Scattered and confluent T2/FLAIR hyperintensity in the periventricular and subcortical white matter. No mass lesion or midline shift. Cerebellum is unremarkable. Normal appearance of midline structures. The basilar cisterns are patent. No extra-axial fluid collections. Ventricles: Normal size and configuration of the ventricles. Vascular: Skull base flow voids are visualized. Skull and upper cervical spine: Facet arthrosis in the visualized upper cervical spine most pronounced on the right at C3-4. Redemonstration of multiple calvarial lesions corresponding to lytic lesions on CT. Sinuses/Orbits: The orbits are symmetric. Minimal mucosal thickening in the alveolar recess of the right maxillary sinus. There is a 1.3 x 1.0 x 1.5 cm circumscribed cystic appearing lesion in the subcutaneous tissues overlying the nasal bones slightly right of midline which demonstrates internal restricted diffusion. No evidence of connection to the intracranial compartment. Other: Large right mastoid effusion. Trace fluid in the left mastoid air cells. Fluid within the nasopharynx. Partially visualized enteric tube and endotracheal tube. IMPRESSION: No acute intracranial abnormality. Nonspecific white matter signal abnormality likely reflecting moderate chronic microvascular ischemic changes. Multiple calvarial lesions corresponding to lytic lesions on CT. Findings concerning for multiple myeloma versus metastasis. 1.5 cm cystic lesion in the subcutaneous tissues overlying the nasal bones likely reflecting epidermoid cyst versus dermoid. No evidence of connection to the intracranial compartment. Large right mastoid effusion. Electronically Signed   By: Denny Flack M.D.   On: 07/18/2023 12:54   CT HEAD WO CONTRAST ( ) Result Date: 07/17/2023 CLINICAL DATA:  58 year old male altered mental status. Respiratory failure. "Myeloma". EXAM: CT HEAD WITHOUT CONTRAST TECHNIQUE: Contiguous axial images were obtained  from the base of the skull through the vertex without intravenous contrast. RADIATION DOSE REDUCTION: This exam was performed according to the departmental dose-optimization program which includes automated exposure control, adjustment of the mA and/or kV according to patient size and/or use of iterative reconstruction technique. COMPARISON:  No prior head CT.  CT Abdomen and Pelvis 07/08/2023. FINDINGS: Brain: Cerebral volume is within normal limits for age. No midline shift, ventriculomegaly, mass effect, evidence of mass lesion, intracranial hemorrhage or evidence of cortically based acute infarction. Gray-white differentiation maintained, mild to moderate for age scattered cerebral white matter hypodensity. Vascular: Calcified atherosclerosis at the skull base. No suspicious intracranial vascular hyperdensity. Skull: Scattered nonspecific round an oval lucent areas in the bilateral calvarium. No pathologic fracture identified. Sinuses/Orbits: Intubated. Right nasoenteric tube in  place. Relatively well aerated paranasal sinuses, partial opacification of the right middle ear and mastoids. Other: Visualized orbits and scalp soft tissues are within normal limits. IMPRESSION: 1. No acute intracranial abnormality. Mild to moderate for age cerebral white matter changes most commonly due to small vessel disease. 2. Scattered lytic lesions in the skull, similar to the skeletal findings on 07/08/2023, and highly suspicious for Multiple Myeloma versus lytic metastases. 3. Intubated. Electronically Signed   By: Marlise Simpers M.D.   On: 07/17/2023 14:37   DG CHEST PORT 1 VIEW Result Date: 07/17/2023 CLINICAL DATA:  Acute respiratory failure.  Hypoxia. EXAM: PORTABLE CHEST 1 VIEW COMPARISON:  07/13/2023 FINDINGS: A feeding tube passes into the stomach although the distal tip position is not included on the film. Right IJ central line tip overlies the proximal SVC level. Low volume film with interval development of vascular  congestion basilar predominant interstitial opacity, concerning for edema. Probable small left effusion. Cardiopericardial silhouette is at upper limits of normal for size. Degenerative changes noted in both shoulders. Telemetry leads overlie the chest. IMPRESSION: Low volume film with interval development of vascular congestion and basilar predominant interstitial opacity, concerning for edema. Probable small left effusion. Electronically Signed   By: Donnal Fusi M.D.   On: 07/17/2023 07:41   DG Chest Port 1 View Result Date: 07/13/2023 CLINICAL DATA:  Respiratory failure EXAM: PORTABLE CHEST 1 VIEW COMPARISON:  07/09/2023 FINDINGS: Support apparatus: Endotracheal tube terminates 5.9 cm above carina. Right IJ Cordis sheath unchanged. Feeding tube extends beyond the inferior aspect of the film. Heart/mediastinum: Midline trachea.  Normal heart size. Pleura: Possible trace left pleural fluid.  No pneumothorax. Lungs: Persistent left and increased right base airspace disease. Other: Right glenohumeral joint degenerative change. IMPRESSION: Similar left and increased right base airspace disease, favoring atelectasis. Possible trace left pleural fluid. Electronically Signed   By: Lore Rode M.D.   On: 07/13/2023 09:39   EEG adult Result Date: 07/11/2023 Arleene Lack, MD     07/11/2023  3:28 PM Patient Name: Nyron Chmura. MRN: [id number] Epilepsy Attending: Arleene Lack Referring Physician/Provider: Casimiro Cleaves, PA-C Date: 07/11/2023 Duration: 24.55 mins Patient history: 58yo M with ams. EEG to evaluate for seizure Level of alertness:  comatose/ lethargic AEDs during EEG study: VPA Technical aspects: This EEG study was done with scalp electrodes positioned according to the 10-20 International system of electrode placement. Electrical activity was reviewed with band pass filter of 1-70Hz , sensitivity of 7 uV/mm, display speed of 35mm/sec with a 60Hz  notched filter applied as appropriate. EEG data  were recorded continuously and digitally stored.  Video monitoring was available and reviewed as appropriate. Description: EEG showed continuous generalized 3-5 hz theta-delta slowing, at times with triphasic morphology.  Hyperventilation and photic stimulation were not performed.   ABNORMALITY - Continuous slow, generalized IMPRESSION: This study is suggestive of moderate to severe encephalopathy, could be secondary to toxic metabolic causes. No seizures or epileptiform discharges were seen throughout the recording. Arleene Lack   DG Chest 1 View Result Date: 07/09/2023 CLINICAL DATA:  Status post central line placement EXAM: PORTABLE CHEST 1 VIEW COMPARISON:  07/08/2023 FINDINGS: Endotracheal tube and feeding catheter are noted in satisfactory position. New right jugular catheter is seen in satisfactory position. No pneumothorax is noted. The overall inspiratory effort is poor with crowding of the vascular markings. IMPRESSION: Poor inspiratory effort. No pneumothorax following central line placement. Electronically Signed   By: Violeta Grey M.D.   On:  07/09/2023 11:19   CT ABDOMEN PELVIS WO CONTRAST Result Date: 07/08/2023 CLINICAL DATA:  Palpable right groin mass. Anemia requiring 3 units of packed red blood cells. Sepsis due to acquired pneumonia. Lytic bone lesions with a clinical concern for a new diagnosis of multiple myeloma. Type 2 diabetes. EXAM: CT ABDOMEN AND PELVIS WITHOUT CONTRAST TECHNIQUE: Multidetector CT imaging of the abdomen and pelvis was performed following the standard protocol without IV contrast. RADIATION DOSE REDUCTION: This exam was performed according to the departmental dose-optimization program which includes automated exposure control, adjustment of the mA and/or kV according to patient size and/or use of iterative reconstruction technique. COMPARISON:  CT-guided right iliac bone marrow aspiration and core biopsy dated 07/04/2023. FINDINGS: Lower chest: Multifocal patchy  opacities in both lower lobes. Small left pleural effusion and minimal right pleural effusion. Borderline enlarged heart. Hepatobiliary: Normal-appearing liver. Dependent calcification in the gallbladder without gallbladder wall thickening or pericholecystic fluid. Pancreas: Unremarkable. No pancreatic ductal dilatation or surrounding inflammatory changes. Spleen: Normal in size without focal abnormality. Adrenals/Urinary Tract: Foley catheter in the urinary bladder with no significant urine in the bladder. Single tiny calculus in each kidney. Small, exophytic high density left renal cyst compatible with a hemorrhagic or proteinaceous cyst. Probable subcentimeter exophytic left renal cyst, difficult to assess due to streak artifacts produced by the patient's left arm. This could be assessed with renal ultrasound. Unremarkable adrenal glands and ureters. Stomach/Bowel: Stomach is within normal limits. Appendix appears normal. No evidence of bowel wall thickening, distention, or inflammatory changes. Vascular/Lymphatic: Atheromatous arterial calcifications without aneurysm. No enlarged lymph nodes. Reproductive: Normal-sized prostate gland containing coarse calcifications. Other: Large right buttock and groin hematoma containing a fluid/hematocrit level. This measures 36.0 x 11.4 x 9.7 cm in maximum dimensions on sagittal and coronal images 32/7 and 39/6 respectively. Musculoskeletal: Left hip prosthesis with associated streak artifacts. Nondisplaced right 10th posterolateral rib fracture. Old, healed left posterolateral 10th rib fracture. Multiple small to medium-sized lytic lesions in multiple lumbar and lower thoracic vertebral bodies and in the pelvic bones bilaterally. Moderate right hip degenerative changes. IMPRESSION: 1. Large acute right buttock and groin hematoma measuring 36.0 x 11.4 x 9.7 cm. 2. Nondisplaced right 10th posterolateral rib fracture. 3. Multiple small to medium-sized lytic lesions in multiple  lumbar and lower thoracic vertebral bodies and in the pelvic bones bilaterally, compatible with the clinical suspicion of multiple myeloma. Lytic metastases can also have this appearance. 4. Multifocal patchy opacities in both lower lobes, compatible with known pneumonia. 5. Small left pleural effusion and minimal right pleural effusion. 6. Cholelithiasis. 7. Tiny bilateral renal calculi. 8. Probable subcentimeter exophytic left renal cyst, difficult to assess due to streak artifacts produced by the patient's left arm. This could be assessed with renal ultrasound. Electronically Signed   By: Catherin Closs M.D.   On: 07/08/2023 16:07   DG CHEST PORT 1 VIEW Result Date: 07/08/2023 CLINICAL DATA:  [id number] Endotracheally intubated [id number] EXAM: PORTABLE CHEST 1 VIEW COMPARISON:  07/08/2023 FINDINGS: The endotracheal tube is positioned just to the left of midline, likely within the mid trachea, likely due to patient rotation. Mild cardiomegaly. No focal airspace consolidation, pleural effusion, or pneumothorax. No cardiomegaly. No acute fracture or destructive lesion. Moderate osteoarthritis of the right shoulder. Multilevel degenerative disc disease of the spine. Weighted feeding tube courses below the diaphragm with the distal tip not included in the field of view. IMPRESSION: 1. The endotracheal tube is positioned just to the left of midline, likely within the mid trachea,  but abnormally positioned due to patient rotation. No pneumothorax. 2. Weighted feeding tube courses below the diaphragm, distal tip outside the field of view. Electronically Signed   By: Rance Burrows M.D.   On: 07/08/2023 14:02   DG CHEST PORT 1 VIEW Result Date: 07/08/2023 CLINICAL DATA:  Dyspnea. EXAM: PORTABLE CHEST 1 VIEW COMPARISON:  07/06/2023 FINDINGS: Feeding tube extends down the esophagus into the stomach. Tube appears to be coiled in the stomach but the tip is not clearly identified. Heart size is within normal limits and stable.  Prominent vascular and interstitial lung markings are unchanged. No focal airspace disease or lung consolidation. Again noted is severe joint space narrowing and probable degenerative changes in both shoulders. Trachea is midline. IMPRESSION: 1. Again noted are prominent vascular and interstitial lung markings. Findings could represent vascular congestion or mild edema. Minimal change since 07/06/2023. 2. Feeding tube extends into the abdomen. Electronically Signed   By: Elene Griffes M.D.   On: 07/08/2023 10:25   DG Abd 1 View Result Date: 07/07/2023 CLINICAL DATA:  Nasogastric tube placement. EXAM: ABDOMEN - 1 VIEW COMPARISON:  None Available. FINDINGS: Weighted enteric tube tip in the left upper abdomen in the region of the mid or distal stomach. No bowel dilatation in the included upper abdomen. IMPRESSION: Weighted enteric tube tip in the mid or distal stomach. Electronically Signed   By: Chadwick Colonel M.D.   On: 07/07/2023 17:08   DG Fluoro Rm 1-60 Min - No Report Result Date: 07/07/2023 CLINICAL DATA:  Acute encephalopathy, nasogastric tube placement requested. EXAM: FLUORO RM 1-60 MIN-NO REPORT FLUOROSCOPY: Fluoroscopy Time:  0 minutes 48 seconds Radiation Exposure Index (if provided by the fluoroscopic device): 7.0 mGy Number of Acquired Spot Images: 1 COMPARISON:  None Available. FINDINGS: Multiple attempts at nasogastric tube placement with a 20 French catheter resulted in right or left mainstem bronchus positioning. Patient was unable to swallow or follow directions to aid with insertion. Study was terminated. IMPRESSION: Unsuccessful nasogastric tube placement under fluoroscopy. Electronically Signed   By: Shearon Denis M.D.   On: 07/07/2023 14:13   ECHOCARDIOGRAM COMPLETE Result Date: 07/07/2023    ECHOCARDIOGRAM REPORT   Patient Name:   Frank [name]. Date of Exam: 07/07/2023 Medical Rec #:  [id number]          Height:       74.0 in Accession #:    [id number]         Weight:       236.6  lb Date of Birth:  09/10/1965          BSA:          2.334 m Patient Age:    58 years           BP:           141/88 mmHg Patient Gender: M                  HR:           110 bpm. Exam Location:  Inpatient Procedure: 3D Echo, Cardiac Doppler, Color Doppler and Intracardiac            Opacification Agent (Both Spectral and Color Flow Doppler were            utilized during procedure). Indications:    Atrial fibrillation  History:        Patient has no prior history of Echocardiogram examinations.  Sonographer:    Juanita Shaw Referring Phys:  [id number] JOSHUA PARKER  Sonographer Comments: Image acquisition challenging due to patient body habitus and Image acquisition challenging due to respiratory motion. IMPRESSIONS  1. Left ventricular ejection fraction, by estimation, is 65 to 70%. The left ventricle has normal function. The left ventricle has no regional wall motion abnormalities. Left ventricular diastolic parameters are indeterminate.  2. Right ventricular systolic function is normal. The right ventricular size is normal. There is normal pulmonary artery systolic pressure. The estimated right ventricular systolic pressure is 35.8 mmHg.  3. Left atrial size was moderately dilated.  4. The mitral valve is normal in structure. No evidence of mitral valve regurgitation. No evidence of mitral stenosis.  5. The aortic valve is tricuspid. Aortic valve regurgitation is not visualized. Aortic valve sclerosis/calcification is present, without any evidence of aortic stenosis. Aortic valve mean gradient measures 8.0 mmHg.  6. Aortic dilatation noted. There is mild dilatation of the aortic root, measuring 39 mm.  7. The inferior vena cava is dilated in size with <50% respiratory variability, suggesting right atrial pressure of 15 mmHg.  8. The patient was in atrial fibrillation. FINDINGS  Left Ventricle: Left ventricular ejection fraction, by estimation, is 65 to 70%. The left ventricle has normal function. The left ventricle  has no regional wall motion abnormalities. The left ventricular internal cavity size was normal in size. There is  no left ventricular hypertrophy. Left ventricular diastolic parameters are indeterminate. Right Ventricle: The right ventricular size is normal. No increase in right ventricular wall thickness. Right ventricular systolic function is normal. There is normal pulmonary artery systolic pressure. The tricuspid regurgitant velocity is 2.28 m/s, and  with an assumed right atrial pressure of 15 mmHg, the estimated right ventricular systolic pressure is 35.8 mmHg. Left Atrium: Left atrial size was moderately dilated. Right Atrium: Right atrial size was normal in size. Pericardium: There is no evidence of pericardial effusion. Mitral Valve: The mitral valve is normal in structure. No evidence of mitral valve regurgitation. No evidence of mitral valve stenosis. MV peak gradient, 5.4 mmHg. The mean mitral valve gradient is 2.0 mmHg. Tricuspid Valve: The tricuspid valve is normal in structure. Tricuspid valve regurgitation is trivial. Aortic Valve: The aortic valve is tricuspid. Aortic valve regurgitation is not visualized. Aortic valve sclerosis/calcification is present, without any evidence of aortic stenosis. Aortic valve mean gradient measures 8.0 mmHg. Aortic valve peak gradient measures 16.0 mmHg. Aortic valve area, by VTI measures 2.25 cm. Pulmonic Valve: The pulmonic valve was normal in structure. Pulmonic valve regurgitation is not visualized. Aorta: Aortic dilatation noted. There is mild dilatation of the aortic root, measuring 39 mm. Venous: The inferior vena cava is dilated in size with less than 50% respiratory variability, suggesting right atrial pressure of 15 mmHg. IAS/Shunts: No atrial level shunt detected by color flow Doppler.  LEFT VENTRICLE PLAX 2D LVIDd:         5.00 cm      Diastology LVIDs:         3.60 cm      LV e' medial:    7.94 cm/s LV PW:         0.60 cm      LV E/e' medial:  17.0 LV  IVS:        0.90 cm      LV e' lateral:   12.90 cm/s LVOT diam:     2.00 cm      LV E/e' lateral: 10.5 LV SV:         56 LV  SV Index:   24 LVOT Area:     3.14 cm  LV Volumes (MOD) LV vol d, MOD A4C: 170.0 ml LV vol s, MOD A4C: 50.6 ml LV SV MOD A4C:     170.0 ml RIGHT VENTRICLE             IVC RV Basal diam:  4.20 cm     IVC diam: 2.50 cm RV Mid diam:    2.80 cm RV S prime:     22.40 cm/s TAPSE (M-mode): 2.0 cm LEFT ATRIUM              Index        RIGHT ATRIUM           Index LA diam:        4.20 cm  1.80 cm/m   RA Area:     17.80 cm LA Vol (A2C):   106.0 ml 45.41 ml/m  RA Volume:   43.80 ml  18.76 ml/m LA Vol (A4C):   86.7 ml  37.14 ml/m LA Biplane Vol: 101.0 ml 43.27 ml/m  AORTIC VALVE                     PULMONIC VALVE AV Area (Vmax):    2.04 cm      PV Vmax:       1.42 m/s AV Area (Vmean):   2.04 cm      PV Peak grad:  8.1 mmHg AV Area (VTI):     2.25 cm AV Vmax:           200.00 cm/s AV Vmean:          125.000 cm/s AV VTI:            0.248 m AV Peak Grad:      16.0 mmHg AV Mean Grad:      8.0 mmHg LVOT Vmax:         130.00 cm/s LVOT Vmean:        81.200 cm/s LVOT VTI:          0.178 m LVOT/AV VTI ratio: 0.72  AORTA Ao Root diam: 3.90 cm Ao Asc diam:  3.30 cm MITRAL VALVE                TRICUSPID VALVE MV Area (PHT): 5.70 cm     TR Peak grad:   20.8 mmHg MV Area VTI:   2.31 cm     TR Vmax:        228.00 cm/s MV Peak grad:  5.4 mmHg MV Mean grad:  2.0 mmHg     SHUNTS MV Vmax:       1.16 m/s     Systemic VTI:  0.18 m MV Vmean:      65.4 cm/s    Systemic Diam: 2.00 cm MV Decel Time: 133 msec MV E velocity: 135.00 cm/s Dalton McleanMD Electronically signed by Archer Bear Signature Date/Time: 07/07/2023/9:53:50 AM    Final    DG CHEST PORT 1 VIEW Result Date: 07/06/2023 CLINICAL DATA:  Weakness altered EXAM: PORTABLE CHEST 1 VIEW COMPARISON:  07/05/2023 FINDINGS: Hypoventilatory changes. Cardiomegaly with mild central congestion. No consolidation, pleural effusion, or pneumothorax IMPRESSION:  Cardiomegaly with mild central congestion. Electronically Signed   By: Esmeralda Hedge M.D.   On: 07/06/2023 18:17   DG CHEST PORT 1 VIEW Result Date: 07/05/2023 CLINICAL DATA:  Dyspnea EXAM: PORTABLE CHEST 1 VIEW COMPARISON:  07/04/2023 FINDINGS: Hypoventilatory change. Cardiomegaly with mild central congestion.  No pleural effusion or pneumothorax. Advanced degenerative change of the shoulders. IMPRESSION: Cardiomegaly with mild central congestion. Electronically Signed   By: Esmeralda Hedge M.D.   On: 07/05/2023 16:06   DG CHEST PORT 1 VIEW Result Date: 07/04/2023 CLINICAL DATA:  Hypertension and dyspnea, generalized weakness, AKI EXAM: PORTABLE CHEST 1 VIEW COMPARISON:  None are available. There is a report from radiographs 12/14/2018 FINDINGS: Borderline cardiomegaly. Diffuse interstitial coarsening and airspace opacities bilaterally. No pleural effusion or pneumothorax. No displaced rib fractures. Advanced arthritis both shoulders. IMPRESSION: Diffuse interstitial coarsening and airspace opacities bilaterally, likely edema. Atypical infection could appear similarly. Electronically Signed   By: Rozell Cornet M.D.   On: 07/04/2023 22:21   CT BONE MARROW BIOPSY & ASPIRATION Result Date: 07/04/2023 INDICATION: [id number] Myeloma (HCC) [id number] EXAM: CT GUIDED BONE MARROW ASPIRATION AND CORE BIOPSY MEDICATIONS: None. ANESTHESIA/SEDATION: Moderate (conscious) sedation was employed during this procedure. A total of Versed  2 mg and Fentanyl  50 mcg was administered intravenously. Moderate Sedation Time: 16 minutes. The patient's level of consciousness and vital signs were monitored continuously by radiology nursing throughout the procedure under my direct supervision. FLUOROSCOPY TIME:  CT dose; 314 mGycm COMPLICATIONS: None immediate. Estimated blood loss: <5 mL PROCEDURE: RADIATION DOSE REDUCTION: This exam was performed according to the departmental dose-optimization program which includes automated exposure  control, adjustment of the mA and/or kV according to patient size and/or use of iterative reconstruction technique. Informed written consent was obtained from the patient and/or patient's representative after a thorough discussion of the procedural risks, benefits and alternatives. All questions were addressed. Maximal Sterile Barrier Technique was utilized including caps, mask, sterile gowns, sterile gloves, sterile drape, hand hygiene and skin antiseptic. A timeout was performed prior to the initiation of the procedure. The patient was positioned prone and non-contrast localization CT was performed of the pelvis to demonstrate the iliac marrow spaces. Under sterile conditions and local anesthesia, an 11 gauge coaxial bone biopsy needle was advanced into the RIGHT iliac marrow space. Needle position was confirmed with CT imaging. Initially, bone marrow aspiration was performed. Next, the 11 gauge outer cannula was utilized to obtain a 1 iliac bone marrow core biopsy. Needle was removed. Hemostasis was obtained with compression. The patient tolerated the procedure well. Samples were prepared with the cytotechnologist. IMPRESSION: Successful CT-guided bone marrow aspiration and biopsy. Art Largo, MD [facility] Electronically Signed   By: Art Largo M.D.   On: 07/04/2023 12:21

## 2023-07-24 NOTE — Progress Notes (Signed)
 Daily Progress Note   Patient Name: Frank [name].       Date: 07/24/2023 DOB: [birth year]  Age: 58 y.o. MRN#: [id number] Attending Physician: Maire Scot, MD Primary Care Physician: Serita Danes, MD Admit Date: 07/03/2023  Reason for Consultation/Follow-up: Establishing goals of care  Patient Profile/HPI:  58 y.o. male with past medical history of OSA on CPAP, GERD, HTN, HLD, DM2, BPH, seizure disorder admitted on 07/03/2023 as a transfer from [facility] health due to AMS. Workup revealed severe sepsis due to pneumonia and metabolic encephalopathy. He also has been newly diagnosed with multiple myeloma this admission and treatment started with Velcade  and Cytoxan . Admission has been complicated by pancytopenias, septic shock due to pneumonia with respiratory failure requiring intubation (intubated on 4/22), kidney injury- now on CRRT, is anuric. He is currently on vasopressor support. Palliative medicine consulted for GOC.   Subjective: Reviewed nephrology, oncology and pulmonology notes.  Discussed with PCCM NP.  Labs reviewed- WBC up 26.1 today- he is s/p nueopogen injections.  On eval, just completed trach. Copious secretions noted when ET tube removed.  Called spouse- Frank [name]. She shared that yesterday she indicated she wanted patient to have one round of CPR- but after speaking with her daughter, they had decided he should have 3 rounds of CPR.  I shared with her that typically when a patient is full code in the hospital, once CPR is started it is continued until either ROSC is achieved, or it is apparent that patient cannot be resuscitated. I also shared my concern that if [name] declined to the point of needing CPR, it is unlikely he will be restored to a point of being able to be  awake and interact with family, as the cause of the arrest is likely associated with chronic/terminal disease rather than a reversible acute cardio-pulmonary event. Frank [name] understands risk, however, notes difficult discussion with her daughter and therefore wishes for continued full code status.    Review of Systems  Unable to perform ROS: Mental status change     Physical Exam Vitals and nursing note reviewed.  Constitutional:      Appearance: He is ill-appearing.  Cardiovascular:     Rate and Rhythm: Normal rate.  Pulmonary:     Comments: Trach- on vent Neurological:     Comments: sedated  Vital Signs: BP 122/79   Pulse 79   Temp 98.1 F (36.7 C)   Resp 16   Ht 6' 0.01" (1.829 m)   Wt 103.5 kg   SpO2 100%   BMI 30.94 kg/m  SpO2: SpO2: 100 % O2 Device: O2 Device: Ventilator O2 Flow Rate: O2 Flow Rate (L/min): 15 L/min  Intake/output summary:  Intake/Output Summary (Last 24 hours) at 07/24/2023 1426 Last data filed at 07/24/2023 1300 Gross per 24 hour  Intake 1994.01 ml  Output 3833.9 ml  Net -1839.89 ml   LBM: Last BM Date : 07/23/23 Baseline Weight: Weight: 110.6 kg Most recent weight: Weight: 103.5 kg       Palliative Assessment/Data: PPS: 10% (with feeding tube)      Patient Active Problem List   Diagnosis Date Noted   Multiple myeloma without remission (HCC) 07/24/2023   Lobar pneumonia, unspecified organism (HCC) 07/21/2023   On mechanically assisted ventilation (HCC) 07/19/2023   Acute metabolic encephalopathy 07/19/2023   Acute encephalopathy 07/14/2023   Severe sepsis with septic shock (HCC) 07/14/2023   PAF (paroxysmal atrial fibrillation) (HCC) 07/09/2023   Acute respiratory failure with hypoxia (HCC) 07/08/2023   Atrial fibrillation with rapid ventricular response (HCC) 07/07/2023   AKI (acute kidney injury) (HCC) 07/07/2023   Multiple myeloma not having achieved remission (HCC) 07/04/2023   Generalized weakness 07/03/2023    Closed fracture of left distal humerus 01/29/2023   Status post total left knee replacement 11/23/2021   Unilateral primary osteoarthritis, right knee 01/25/2019   Carpal tunnel syndrome, left upper limb 10/15/2018   Carpal tunnel syndrome, right upper limb 10/15/2018   Bilateral hand numbness 09/09/2018   Plantar fasciitis of left foot 09/09/2018   Status post total replacement of left hip 12/26/2017   Unilateral primary osteoarthritis, left knee 11/12/2017   Hypogonadism, male 05/08/2016    Palliative Care Assessment & Plan    Assessment/Recommendations/Plan  Continue current interventions PMT will chart check over weekend and followup early next week- please call if acute needs arise   Code Status:   Code Status: Full Code   Prognosis:  Unable to determine  Discharge Planning: To Be Determined  Care plan was discussed with PCCM NP and patient's spouse  Thank you for allowing the Palliative Medicine Team to assist in the care of this patient.  Total time:  Prolonged billing:  Time includes:   Preparing to see the patient (e.g., review of tests) Obtaining and/or reviewing separately obtained history Performing a medically necessary appropriate examination and/or evaluation Counseling and educating the patient/family/caregiver Ordering medications, tests, or procedures Referring and communicating with other health care professionals (when not reported separately) Documenting clinical information in the electronic or other health record Independently interpreting results (not reported separately) and communicating results to the patient/family/caregiver Care coordination (not reported separately) Clinical documentation  Micki Alas, AGNP-C Palliative Medicine   Please contact Palliative Medicine Team phone at [phone] for questions and concerns.

## 2023-07-24 NOTE — Progress Notes (Signed)
 [facility] NEPHROLOGY PROGRESS NOTE  Assessment/ Plan: Pt is a 58 y.o. yo male with past medical history significant for HTN, HLD, type II DM, BPH, seizure who was initially presented at [facility] due to AMS, admitted for sepsis due to pneumonia, encephalopathy, sepsis, AKI, hypercalcemia symptomatic anemia and Lytic pulm lesion concern for multiple myeloma.  # AKI on CKD 3a - b/l creat 1.1- 1.5 from 2024, eGFR 53- >60 ml/min. Creat here 5.8 on presentation at OSH. AKI due to myeloma kidney +/- hyperCa +/- ACEi (home med). Started CRRT on 4/23. Pt remains anuric, and tolerating CRRT.  No further clotting after circuit heparin  increased 5/6   # AMS/acute metabolic encephalopathy: per primary, seems to be protracted.  MRI, LP completed and unrevealing.   #Volume -maintain slightly net neg. His hypoNa is improving with UF/CRRT.    # Shock- remains on pressor support, midodrine too.  # Multiple myeloma: Oncologist started Velcade  and Cytoxan , per oncology team.   On hold for now given ongoing critical illness. Dr. Ennevar following closely.   #AHRF: trach planned for today.  # Neutropenia: post chemoRx, getting IV abx and filgrastim . WBC up 2 > 12k past 24h.  Oncology following. ID following.  # Atrial fib-  on amio.  Current in NSR  #Multifocal PNA - s/p full course abx  #Anemia - prn transfusions. On epogen  per onc.   # Hypophosphatemia: due to CRRT, remains low. IV Naphos prn  Palliative care involved for this critically ill patient with protracted course.   Subjective:  Seen in ICU remains on vent, sedated with vasopressor support Net 1.6L yesterday, remains anuric Trach planned today   Objective Vital signs in last 24 hours: Vitals:   07/24/23 0730 07/24/23 0737 07/24/23 0742 07/24/23 0800  BP: 113/70   112/68  Pulse: 81   83  Resp: (!) 21   20  Temp: 98.4 F (36.9 C)   98.8 F (37.1 C)  TempSrc:      SpO2: 98% 98% 98% 97%  Weight:       Height:        Physical Exam: General: Critically ill looking male, intubated Heart:RRR, s1s2 nl Lungs: Coarse breath sound bilateral on vent Abdomen:soft, Non-tender, non-distended Extremities:No edema, LUE more tense now with 1+ pitting edema, R trace  Neurology: not responsive, on vent   Adrian Alba MD [facility] Pager [phone]   Recent Labs  Lab 07/23/23 0416 07/23/23 1722 07/24/23 0421  HGB 8.3*  --  8.9*  ALBUMIN  <1.5*  <1.5* 1.6* 1.6*  CALCIUM  7.5* 7.4* 7.4*  PHOS 1.9*  1.9* 3.4 2.0*  1.5*  CREATININE 1.62* 1.67* 1.59*  K 4.2 4.9 4.3    Inpatient medications:  amiodarone   200 mg Per NG tube Daily   arformoterol  15 mcg Nebulization BID   busPIRone   15 mg Per Tube QHS   clonazePAM   1 mg Per Tube BID   docusate  100 mg Per Tube BID   epoetin  alfa  40,000 Units Subcutaneous QODAY   etomidate   20 mg Intravenous Once   famotidine   10.4 mg Per Tube Daily   feeding supplement (PROSource TF20)  60 mL Per Tube TID   fentaNYL  (SUBLIMAZE ) injection  200 mcg Intravenous Once   folic acid   1 mg Per Tube Daily   gabapentin   100 mg Per Tube Q12H   [START ON 07/25/2023] heparin  injection (subcutaneous)  5,000 Units Subcutaneous Q8H   insulin  aspart  0-20 Units Subcutaneous  Q4H   insulin  aspart  8 Units Subcutaneous Q4H   ipratropium-albuterol   3 mL Nebulization Q4H   lactulose   20 g Per Tube BID   lidocaine -EPINEPHrine   20 mL Intradermal Once   midazolam   5 mg Intravenous Once   midodrine  10 mg Per Tube TID WC   nicotine   7 mg Transdermal Daily   mouth rinse  15 mL Mouth Rinse Q2H   PARoxetine   40 mg Per Tube Daily   pneumococcal 20-valent conjugate vaccine  0.5 mL Intramuscular Tomorrow-1000   polyethylene glycol  17 g Per Tube Daily   QUEtiapine   50 mg Per Tube BID   revefenacin  175 mcg Nebulization Daily   rocuronium   100 mg Intravenous Once   sodium chloride  flush  10-40 mL Intracatheter Q12H   thiamine   100 mg Per Tube Daily     dexmedetomidine  (PRECEDEX ) IV infusion 1.3 mcg/kg/hr (07/24/23 1005)   feeding supplement (VITAL 1.5 CAL) Stopped (07/23/23 1201)   fentaNYL  infusion INTRAVENOUS 200 mcg/hr (07/24/23 1000)   heparin  10,000 units/ 20 mL infusion syringe 1,000 Units/hr (07/24/23 1000)   levofloxacin (LEVAQUIN) IV 100 mL/hr at 07/24/23 1000   norepinephrine  (LEVOPHED ) Adult infusion 8 mcg/min (07/24/23 1006)   prismasol  BGK 4/2.5 400 mL/hr at 07/24/23 0227   prismasol  BGK 4/2.5 400 mL/hr at 07/24/23 0229   prismasol  BGK 4/2.5 1,500 mL/hr at 07/24/23 1013   sodium PHOSPHATE  IVPB (in mmol) 30 mmol (07/24/23 1011)   acetaminophen  (TYLENOL ) oral liquid 160 mg/5 mL **OR** acetaminophen , fentaNYL , heparin , ondansetron  (ZOFRAN ) IV, mouth rinse, oxyCODONE , polyethylene glycol, polyvinyl alcohol , sodium chloride  flush

## 2023-07-24 NOTE — Plan of Care (Signed)
 Id brief note  Trached today Fever curve better On levoflox monoabx Midodrine/touch of levo Remains on crrt    -nothing to add; continue levoflox as discussed for pna -supportive care for resp failure, mm -will continue to follow

## 2023-07-24 NOTE — Progress Notes (Signed)
 LUE venous duplex has been completed.   Results can be found under chart review under CV PROC. 07/24/2023 5:54 PM Kingston Shawgo RVT, RDMS

## 2023-07-24 NOTE — Plan of Care (Signed)
  Problem: Clinical Measurements: Goal: Ability to maintain clinical measurements within normal limits will improve Outcome: Progressing Goal: Will remain free from infection Outcome: Progressing Goal: Diagnostic test results will improve Outcome: Progressing Goal: Respiratory complications will improve Outcome: Progressing Goal: Cardiovascular complication will be avoided Outcome: Progressing   Problem: Coping: Goal: Level of anxiety will decrease Outcome: Progressing   Problem: Elimination: Goal: Will not experience complications related to bowel motility Outcome: Progressing   Problem: Pain Managment: Goal: General experience of comfort will improve and/or be controlled Outcome: Progressing   Problem: Safety: Goal: Ability to remain free from injury will improve Outcome: Progressing   Problem: Education: Goal: Knowledge of General Education information will improve Description: Including pain rating scale, medication(s)/side effects and non-pharmacologic comfort measures Outcome: Not Progressing   Problem: Health Behavior/Discharge Planning: Goal: Ability to manage health-related needs will improve Outcome: Not Progressing   Problem: Activity: Goal: Risk for activity intolerance will decrease Outcome: Not Progressing   Problem: Nutrition: Goal: Adequate nutrition will be maintained Outcome: Not Progressing   Problem: Elimination: Goal: Will not experience complications related to urinary retention Outcome: Not Progressing   Problem: Skin Integrity: Goal: Risk for impaired skin integrity will decrease Outcome: Not Progressing

## 2023-07-24 NOTE — Procedures (Signed)
 Diagnostic Bronchoscopy  Frank [name]  [id number]  08/18/1965  Date:07/24/23  Time:1:45 PM   Provider Performing:Liam Cammarata C Felipe Horton   Procedure: Diagnostic Bronchoscopy [phone]) Therapeutic aspiration of secretions, initial  Indication(s) Assist with direct visualization of tracheostomy placement  Consent Risks of the procedure as well as the alternatives and risks of each were explained to the patient and/or caregiver.  Consent for the procedure was obtained.   Anesthesia See separate tracheostomy note   Time Out Verified patient identification, verified procedure, site/side was marked, verified correct patient position, special equipment/implants available, medications/allergies/relevant history reviewed, required imaging and test results available.   Sterile Technique Usual hand hygiene, masks, gowns, and gloves were used   Procedure Description Bronchoscope advanced through endotracheal tube and into airway.  After suctioning out tracheal secretions, bronchoscope used to provide direct visualization of tracheostomy placement.  Mucus plugging RUL, thick inspissated mucus across carina. Everything suctioned out. Trach in good position. Balloon entirely in tracheal lumen.  Complications/Tolerance None; patient tolerated the procedure well.   EBL None  Specimen(s) None

## 2023-07-24 NOTE — Progress Notes (Signed)
 NAME:  Frank Morgret., MRN:  [id number], DOB:  Aug 03, 1965, LOS: 21 ADMISSION DATE:  07/03/2023, CONSULTATION DATE:  07/06/23 REFERRING MD:  Dr Maury Space, CHIEF COMPLAINT:  Encephalopathy   History of Present Illness:  PCCM asked to see patient for encephalopathy   Transferred from [facility] with 1 week of generalized weakness, altered mentation, decreased appetite Recently being worked up for multiple myeloma.  Transferred to [facility] for oncology evaluation Nephrology consulted for AKI   Background history of obesity, asthma, obstructive sleep apnea on CPAP GERD, hypertension, hyperlipidemia Has been feeling weak for the last few days according to spouse Admitted with concern for sepsis, community-acquired pneumonia, antibiotic encephalopathy Workup revealed lytic bony lesions with concern for multiple myeloma Transfused with 3 units packed red blood cells Evaluation so far-bone marrow 07/04/2023  Pertinent  Medical History   Past Medical History:  Diagnosis Date   Anxiety    Arthritis    Asthma    Bipolar disorder (HCC)    Current every day smoker    Depression    Diabetes mellitus, type II (HCC)    Dyspnea    History of kidney stones    History of pneumonia    Hyperlipidemia    Hypertension    Morbid obesity (HCC)    Sedentary lifestyle    Seizures (HCC)    last seizure 12 yrs ago, no current problem   Sleep apnea    uses CPAP nightly   Significant Hospital Events: Including procedures, antibiotic start and stop dates in addition to other pertinent events   4/19-blood cultures, MRSA PCR negative 4/19 chest x-ray with mild pulmonary congestion 4/20 atrial fibrillation with RVR 4/21 nosebleed after IR NG attempt, NG placed by PCCM in afternoon, stable transferred to [facility] 4/22 intubated; some abla requiring prbcs; ct abd/pelvis showing acute groin hematoma tracking from posterior presumed from site of bone marrow biopsy; heparin  held EEG 4/25 >> moderate to severe  encephalopathy without any seizures or epileptiform discharges 4/23 respiratory culture >> MRSE 4/28 remains intubated on CRRT, down to one pressor  4/29 labile BP, NE back up. Responded a bit to fluids. Received Velcade  and Cytoxan  chemotherapy 5/1 remains encephalopathic, check CTH 5/2 LP ETT exchange  5/5 1 PRBC. Palliative care met w family  5/6 IVIG. plan for trach established. Added midodrine after failed vaso wean. Abx/fungal changed to levaquin  5/7 weaning pressors incr midodrine incr lactulose . Vaso off  5/8 Trach  Interim History / Subjective:   NE 9 Fent 200 / precedex  1.3  WBC big jump from 2 to 12 > 26 (s/p G-CSF) Na up to 132 phos two results 1.5 and 2.   PJP neg   Cortisol pending   Objective   Blood pressure 102/74, pulse 87, temperature 98.1 F (36.7 C), resp. rate 20, height 6' 0.01" (1.829 m), weight 103.5 kg, SpO2 98%.    Vent Mode: PRVC FiO2 (%):  [30 %] 30 % Set Rate:  [20 bmp] 20 bmp Vt Set:  [540 mL] 540 mL PEEP:  [5 cmH20] 5 cmH20 Plateau Pressure:  [16 cmH20-19 cmH20] 18 cmH20   Intake/Output Summary (Last 24 hours) at 07/24/2023 0727 Last data filed at 07/24/2023 0700 Gross per 24 hour  Intake 2418.03 ml  Output 4020.4 ml  Net -1602.37 ml   Filed Weights   07/22/23 0500 07/23/23 0500 07/24/23 0600  Weight: 105 kg 104.5 kg 103.5 kg     Physical Exam: General: Middle aged male on vent. HEENT: [place]/At, PERRL,  no JVD. ETT Neuro:  RASS -3 CV: RRR, no MRG PULM:  Clear bilateral breath sounds. Minimal vent support.  GI: Soft, non-distended Extremities:  soft R groin hematoma. Stable swelling overlying L bicep   __________________________________________________________  Micro: 4/28 BCx > no growth, final 4/30 respiratory culture-few PMN, rare budding yeast, abundant GPC> few aspergillus fumigatus Quantiferon 5/2 > neg Trach asp 5/4> rare GPC, rare mold  PCP smear trach asp 5/5 > neg   CSF studies 1 RBC, 1 WBC, glucose 106 CSF culture-2  organisms acute, rare WBC> NGTD Cryptococcal antigen negative meningitis and encephalitis panel negative VDRL  non-reactive  5/4 trach aspirate - rare mold PJP 5/5 negative  AFB CSF 5/2 >>>>  Assessment & Plan:   Acute encephalopathy Hx seizure disorder Hyperammonemia P -BID lactulose , repeat ammonia in AM -delirium precautions  -Home paxil  and gabapentin  continue -holding home VPA w ammonia issues  -hopefully trach will assist in weaning sedation -oncology does not feel PLEX is indicated  Acute respiratory failure w hypoxia Small bilateral pleural effusions Bilateral ASD, possible PNA  Hx OSA Hx tobacco use  -originally aspiration of blood 2/2 epistaxis -MSSE PNA -aspirgillus fumigatus + but after d/w ID 5/5 less likely invasive infection  -pjp neg  - BNP > 301  5/7 P - Full vent support - Precedex  and fentanyl  for RASS goal -1 to -2.  - Planning trach today at noon and will hopefully begin SBT 5/9 - Continue brovana yupelri duoneb  Septic shock v vasoplegia  -MSSE PNA -MRSA PCR neg 5/5  -Unlikely invasive fungal infection so vori dc 5/5 - mold in trach asp possibly r/t THC use  - random cortisol 14.3 - PCT 5/7 1.5 P - NE 9 + midodrine 10mg  TID - Hopefully after trach we can wean sedation and pressors.  - antimicrobial plan consolidated to levaquin starting 5/6 for possible legionella coverage. Plan for 7 days. - wean pressors for MAP > 65  - Blood cultures pending  Multiple myeloma  Neutropenia, possible neutropenic fever -- now slight leukocytosis  TLS, improved  -appreciate Oncology's management; received Velcade  and Cytotoxan 4/29,  Velcade  held 5/2. Sounds like velcade  and cytoxan  would not usually catalyze this sort of neutropenia. -got IVIG 5/6 -5/8 swing from WBC 2 to 13 > 26 s/p G-CSF P -onc following-- chemo is currently on hold w other ongoing issues -immunoglobulin levels re-sent 5/6   AKI on CKD 3a  Hyponatremia  Hypophosphatemia   -starting to have filter clotting issues 5/6  P -RRT per nephro  -Keeping slightly negative on UF. BNP 300. Edema improved.  -getting NaPhos again today -follow renal indices   pAF  -now sinus  P - amio 200mg  every day per tube x 14d  -AC was deferred w bleeding issues this admission, now that plt are improving could consider AC should he go back into fib  Anemia, multifactorial  Thrombocytopenia, improving  -ABLA 2/2 R groin hematoma after bmbx -- stable.  -critical illness -MM  -possible drug-related  P -Follow CBC daily -goal hgb> 7   DM2 w hyperglycemia -was on Bid glargine 20 u + rSSI + 8u novolog  q4 prior to emesis/aspiration 5/6 after which EN was held and gradually increased P - rSSI + EN coverage. - holding basal coverage while NPo for trach. Will plan to resume after.   GOC: See IPAL from 5/7. Wife now updating goals to include 2-3 rounds of CPR maximum should he arrest.  Best Practice (right click and "Reselect all  SmartList Selections" daily)   Diet/type: tubefeeds -- Hold 5/8 0000 for trach 5/8  DVT prophylaxis prophylactic heparin   Pressure ulcer(s): identified on: 19/Apr/2025 GI prophylaxis: H2B Lines: Central line and Dialysis Catheter Foley:  na  Code Status:  full code Last date of multidisciplinary goals of care discussion d/w wife 5/7. See IPAL  Critical care time 47 minutes  Roz Cornelia, AGACNP-BC [facility]  See Amion for personal pager PCCM on call pager [phone] until 7pm. Please call Elink 7p-7a. [phone]  07/24/2023 9:45 AM

## 2023-07-24 NOTE — Plan of Care (Signed)
  Problem: Health Behavior/Discharge Planning: Goal: Ability to manage health-related needs will improve Outcome: Progressing   Problem: Clinical Measurements: Goal: Ability to maintain clinical measurements within normal limits will improve Outcome: Progressing   Problem: Nutrition: Goal: Adequate nutrition will be maintained Outcome: Progressing   Problem: Elimination: Goal: Will not experience complications related to bowel motility Outcome: Progressing   Problem: Pain Managment: Goal: General experience of comfort will improve and/or be controlled Outcome: Progressing   Problem: Safety: Goal: Ability to remain free from injury will improve Outcome: Progressing

## 2023-07-24 NOTE — Progress Notes (Signed)
 eLink Physician-Brief Progress Note Patient Name: Frank [name]. DOB: Sep 13, 1965 MRN: [id number]   Date of Service  07/24/2023  HPI/Events of Note  Patient needs restraints order renewed to prevent self-disconnection from life support equipment.  eICU Interventions  Restraints order renewed.        Hodges Treiber U Shakti Fleer 07/24/2023, 11:20 PM

## 2023-07-25 DIAGNOSIS — Z87891 Personal history of nicotine dependence: Secondary | ICD-10-CM | POA: Diagnosis not present

## 2023-07-25 DIAGNOSIS — C9000 Multiple myeloma not having achieved remission: Secondary | ICD-10-CM | POA: Diagnosis not present

## 2023-07-25 DIAGNOSIS — A419 Sepsis, unspecified organism: Secondary | ICD-10-CM | POA: Diagnosis not present

## 2023-07-25 DIAGNOSIS — J90 Pleural effusion, not elsewhere classified: Secondary | ICD-10-CM | POA: Diagnosis not present

## 2023-07-25 DIAGNOSIS — R531 Weakness: Secondary | ICD-10-CM | POA: Diagnosis not present

## 2023-07-25 DIAGNOSIS — G9340 Encephalopathy, unspecified: Secondary | ICD-10-CM | POA: Diagnosis not present

## 2023-07-25 DIAGNOSIS — J9601 Acute respiratory failure with hypoxia: Secondary | ICD-10-CM | POA: Diagnosis not present

## 2023-07-25 LAB — RENAL FUNCTION PANEL
Albumin: 1.6 g/dL — ABNORMAL LOW (ref 3.5–5.0)
Albumin: 1.6 g/dL — ABNORMAL LOW (ref 3.5–5.0)
Anion gap: 6 (ref 5–15)
Anion gap: 2 — ABNORMAL LOW (ref 5–15)
BUN: 36 mg/dL — ABNORMAL HIGH (ref 6–20)
BUN: 40 mg/dL — ABNORMAL HIGH (ref 6–20)
CO2: 25 mmol/L (ref 22–32)
CO2: 28 mmol/L (ref 22–32)
Calcium: 7.6 mg/dL — ABNORMAL LOW (ref 8.9–10.3)
Calcium: 7.7 mg/dL — ABNORMAL LOW (ref 8.9–10.3)
Chloride: 102 mmol/L (ref 98–111)
Chloride: 104 mmol/L (ref 98–111)
Creatinine, Ser: 1.52 mg/dL — ABNORMAL HIGH (ref 0.61–1.24)
Creatinine, Ser: 1.62 mg/dL — ABNORMAL HIGH (ref 0.61–1.24)
GFR, Estimated: 52 mL/min — ABNORMAL LOW (ref >60)
GFR, Estimated: 49 mL/min — ABNORMAL LOW (ref >60)
Glucose, Bld: 146 mg/dL — ABNORMAL HIGH (ref 70–99)
Glucose, Bld: 134 mg/dL — ABNORMAL HIGH (ref 70–99)
Phosphorus: 2.5 mg/dL (ref 2.5–4.6)
Potassium: 4.9 mmol/L (ref 3.5–5.1)
Potassium: 4.6 mmol/L (ref 3.5–5.1)
Sodium: 133 mmol/L — ABNORMAL LOW (ref 135–145)
Sodium: 134 mmol/L — ABNORMAL LOW (ref 135–145)

## 2023-07-25 LAB — APTT: aPTT: 34 s (ref 24–36)

## 2023-07-25 LAB — GLUCOSE, CAPILLARY
Glucose-Capillary: 125 mg/dL — ABNORMAL HIGH (ref 70–99)
Glucose-Capillary: 144 mg/dL — ABNORMAL HIGH (ref 70–99)
Glucose-Capillary: 155 mg/dL — ABNORMAL HIGH (ref 70–99)
Glucose-Capillary: 156 mg/dL — ABNORMAL HIGH (ref 70–99)
Glucose-Capillary: 164 mg/dL — ABNORMAL HIGH (ref 70–99)
Glucose-Capillary: 176 mg/dL — ABNORMAL HIGH (ref 70–99)

## 2023-07-25 LAB — MAGNESIUM: Magnesium: 2.8 mg/dL — ABNORMAL HIGH (ref 1.7–2.4)

## 2023-07-25 LAB — AMMONIA: Ammonia: 49 umol/L — ABNORMAL HIGH (ref 9–35)

## 2023-07-25 LAB — PHOSPHORUS: Phosphorus: 2.5 mg/dL (ref 2.5–4.6)

## 2023-07-25 MED ORDER — LACTULOSE 10 GM/15ML PO SOLN
20.0000 g | Freq: Three times a day (TID) | ORAL | Status: DC
  Administered 2023-07-25 – 2023-07-31 (×19): 20 g
  Filled 2023-07-25 (×19): qty 30

## 2023-07-25 MED ORDER — MIDODRINE HCL 5 MG PO TABS
15.0000 mg | ORAL_TABLET | Freq: Three times a day (TID) | ORAL | Status: DC
  Administered 2023-07-25 (×2): 15 mg
  Filled 2023-07-25 (×2): qty 3

## 2023-07-25 MED ORDER — INSULIN GLARGINE-YFGN 100 UNIT/ML ~~LOC~~ SOLN
10.0000 [IU] | Freq: Two times a day (BID) | SUBCUTANEOUS | Status: DC
  Administered 2023-07-25 – 2023-07-29 (×8): 10 [IU] via SUBCUTANEOUS
  Filled 2023-07-25 (×10): qty 0.1

## 2023-07-25 MED ORDER — MEDIHONEY WOUND/BURN DRESSING EX PSTE
1.0000 | PASTE | Freq: Every day | CUTANEOUS | Status: DC
  Administered 2023-07-25 – 2023-08-05 (×12): 1 via TOPICAL
  Filled 2023-07-25 (×2): qty 44

## 2023-07-25 MED ORDER — QUETIAPINE FUMARATE 50 MG PO TABS
50.0000 mg | ORAL_TABLET | Freq: Every day | ORAL | Status: DC
  Administered 2023-07-25 – 2023-08-03 (×9): 50 mg
  Filled 2023-07-25 (×10): qty 1

## 2023-07-25 MED ORDER — SODIUM CHLORIDE 0.9 % IV SOLN: INTRAVENOUS | Status: AC | PRN

## 2023-07-25 NOTE — Progress Notes (Signed)
 NAME:  Frank Lunde., MRN:  [id number], DOB:  07/12/1965, LOS: 22 ADMISSION DATE:  07/03/2023, CONSULTATION DATE:  07/06/23 REFERRING MD:  Dr Maury Space, CHIEF COMPLAINT:  Encephalopathy   History of Present Illness:   PCCM asked to see patient for encephalopathy   Transferred from [facility] with 1 week of generalized weakness, altered mentation, decreased appetite Recently being worked up for multiple myeloma.  Transferred to [facility] for oncology evaluation Nephrology consulted for AKI   Background history of obesity, asthma, obstructive sleep apnea on CPAP GERD, hypertension, hyperlipidemia Has been feeling weak for the last few days according to spouse Admitted with concern for sepsis, community-acquired pneumonia, antibiotic encephalopathy Workup revealed lytic bony lesions with concern for multiple myeloma Transfused with 3 units packed red blood cells Evaluation so far-bone marrow 07/04/2023  Pertinent  Medical History   Past Medical History:  Diagnosis Date   Anxiety    Arthritis    Asthma    Bipolar disorder (HCC)    Current every day smoker    Depression    Diabetes mellitus, type II (HCC)    Dyspnea    History of kidney stones    History of pneumonia    Hyperlipidemia    Hypertension    Morbid obesity (HCC)    Sedentary lifestyle    Seizures (HCC)    last seizure 12 yrs ago, no current problem   Sleep apnea    uses CPAP nightly   Significant Hospital Events: Including procedures, antibiotic start and stop dates in addition to other pertinent events   4/19-blood cultures, MRSA PCR negative 4/19 chest x-ray with mild pulmonary congestion 4/20 atrial fibrillation with RVR 4/21 nosebleed after IR NG attempt, NG placed by PCCM in afternoon, stable transferred to [facility] 4/22 intubated; some abla requiring prbcs; ct abd/pelvis showing acute groin hematoma tracking from posterior presumed from site of bone marrow biopsy; heparin  held EEG 4/25 >> moderate to severe  encephalopathy without any seizures or epileptiform discharges 4/23 respiratory culture >> MRSE 4/28 remains intubated on CRRT, down to one pressor  4/29 labile BP, NE back up. Responded a bit to fluids. Received Velcade  and Cytoxan  chemotherapy 5/1 remains encephalopathic, check CTH 5/2 LP ETT exchange  5/5 1 PRBC. Palliative care met w family  5/6 IVIG. plan for trach established. Added midodrine  after failed vaso wean. Abx/fungal changed to levaquin   5/7 weaning pressors incr midodrine  incr lactulose . Vaso off  5/8 Trach placed. Some post op bleeding improved with thrombi-pad  Interim History / Subjective:   Trach placed yesterday.  Tolerating trach collar  NE - OFF Fentanyl  OFF/ precedex  0.7  Objective   Blood pressure (!) 141/85, pulse (!) 117, temperature 99.7 F (37.6 C), resp. rate 14, height 6' 0.01" (1.829 m), weight 97.7 kg, SpO2 100%.    Vent Mode: PRVC FiO2 (%):  [30 %-60 %] 40 % Set Rate:  [20 bmp] 20 bmp Vt Set:  [540 mL] 540 mL PEEP:  [5 cmH20] 5 cmH20 Plateau Pressure:  [13 cmH20-17 cmH20] 15 cmH20   Intake/Output Summary (Last 24 hours) at 07/25/2023 1202 Last data filed at 07/25/2023 1108 Gross per 24 hour  Intake 3485.39 ml  Output 4921 ml  Net -1435.61 ml   Filed Weights   07/24/23 0600 07/24/23 2200 07/25/23 0434  Weight: 103.5 kg 97.6 kg 97.7 kg     Physical Exam: General: Critically ill appearing middle aged male on the vent.  HEENT: [place]/AT, no JVD. Trach in place with minimal  oozing.  Neuro:  RASS -1. Very weakly follows commands in R hand and wiggles toes. Examined on precedex .  CV: RRR, no MRG PULM:  Coarse bilaterally. Thick secretions. Tolerating trach collar.  GI: Soft, NT, ND Extremities:  soft R groin hematoma. Stable swelling overlying L bicep   __________________________________________________________  Micro: 4/28 BCx > no growth, final 4/30 respiratory culture-few PMN, rare budding yeast, abundant GPC> few aspergillus  fumigatus Quantiferon 5/2 > neg Trach asp 5/4> rare GPC, rare mold  PCP smear trach asp 5/5 > neg    CSF studies 1 RBC, 1 WBC, glucose 106 CSF culture-2 organisms acute, rare WBC> NGTD Cryptococcal antigen negative meningitis and encephalitis panel negative VDRL  non-reactive  5/4 trach aspirate - rare mold PJP 5/5 negative  AFB CSF 5/2 >>>> Trach aspirate 5/9 >>>   Assessment & Plan:   Acute encephalopathy Hx seizure disorder Hyperammonemia P -BID lactulose , trend ammonia. 49 this morning -delirium precautions  -Home paxil  and gabapentin  continue -holding home VPA w ammonia issues  -Having some success weaning sedation now that he has the trach.   Acute respiratory failure w hypoxia Small bilateral pleural effusions Bilateral ASD, possible PNA  Hx OSA Hx tobacco use  -originally aspiration of blood 2/2 epistaxis -MSSE PNA -aspirgillus fumigatus + but after d/w ID 5/5 less likely invasive infection  -pjp neg  - Recent cultures felt to be colonizers per ID - BNP > 301  5/7 P - Trach collar as tolerated - Rest on PRVC - DC fentanyl  infusion - Ok to use dex PRN for RASS goal 0. Currently tapering off.  - Continue brovana  yupelri  duoneb - Trach should be curative for OSA  Septic shock v vasoplegia  -MSSE PNA -MRSA PCR neg 5/5  -Unlikely invasive fungal infection so vori dc 5/5 - mold in trach asp possibly r/t THC use  - random cortisol 14.3 - PCT 5/7 1.5 P - NE off - Continue midodrine  - antimicrobial plan consolidated to levaquin  starting 5/6 for possible legionella coverage. Plan for 7 days. - Blood cultures pending  Multiple myeloma  Neutropenia, possible neutropenic fever -- now slight leukocytosis  TLS, improved  -appreciate Oncology's management; received Velcade  and Cytotoxan 4/29,  Velcade  held 5/2. Sounds like velcade  and cytoxan  would not usually catalyze this sort of neutropenia. -got IVIG 5/6 -5/9 WBC swing from 2  > 26 s/p G-CSF P -onc  following-- chemo is currently on hold w other ongoing issues  AKI on CKD 3a  Hyponatremia  Hypophosphatemia  P -RRT per nephro  -Keep even starting today, had been pulling 50cc/hr -Will likely stop CRRT in the next 24 hours -If deemed to still need iHD will need transfer to [facility].   PAF  -now sinus  P - amio 200mg  every day per tube x 14d > 5/20 -AC was deferred w bleeding issues this admission, now that plt are improving could consider AC should he go back into fib  Anemia, multifactorial  Thrombocytopenia, improving  -ABLA 2/2 R groin hematoma after bmbx -- stable.  -critical illness -MM  -possible drug-related  P -Follow CBC daily -goal hgb> 7   DM2 w hyperglycemia -was on Bid glargine 20 u + rSSI + 8u novolog  q4 prior to emesis/aspiration 5/6 after which EN was held and gradually increased P - rSSI + EN coverage. - Will resume glargine 10u BID and alter as necessary.  GOC: See IPAL from 5/7. Wife now updating goals to include 2-3 rounds of CPR maximum should he  arrest.  Best Practice (right click and "Reselect all SmartList Selections" daily)   Diet/type: tubefeeds  DVT prophylaxis prophylactic heparin   Pressure ulcer(s): identified on: 19/Apr/2025 GI prophylaxis: H2B Lines: Central line and Dialysis Catheter Foley:  na  Code Status:  full code Last date of multidisciplinary goals of care discussion d/w wife 5/7. See IPAL  Critical care time 45 minutes  Roz Cornelia, AGACNP-BC [facility]  See Amion for personal pager PCCM on call pager [phone] until 7pm. Please call Elink 7p-7a. [phone]  07/25/2023 12:02 PM

## 2023-07-25 NOTE — Progress Notes (Addendum)
 eLink Physician-Brief Progress Note Patient Name: Frank [name]. DOB: Aug 31, 1965 MRN: [id number]   Date of Service  07/25/2023  HPI/Events of Note  Patient needs right wrist restraints renewed to prevent self-extubation.  eICU Interventions  Wrist restraints order renewed.        Murray Guzzetta U Nycole Kawahara 07/25/2023, 11:09 PM

## 2023-07-25 NOTE — Consult Note (Signed)
 WOC Nurse Consult Note: per flowsheet DTPI noted 4/25, photos in chart 07/15/2023 Reason for Consult:sacral wound  Wound type: Deep Tissue Pressure Injuries evolving to Stage 3  Pressure Injury POA: no  Measurement: see nursing flowsheet  Wound bed: 50% red moist 25% brown necrotic 25% scattered purple maroon discoloration/developing eschar  Drainage (amount, consistency, odor) see nursing flowsheet  Periwound: erythema  Dressing procedure/placement/frequency: Cleanse sacral/buttocks wound with Vashe wound cleanser Timm Foot [phone]) do not rinse and allow to air dry. Appy Medihoney to wound bed daily, cover with dry gauze and silicone foam or ABD pad whichever is preferred.   Patient should remain on a low air loss mattress if moved out of ICU setting for pressure redistribution and moisture management.   POC discussed with bedside nurse. WOC team will follow weekly for ongoing assessment and change in POC as necessary.   Thank you,    Ronni Colace MSN, RN-BC, [employer] [phone]

## 2023-07-25 NOTE — Progress Notes (Signed)
 [facility]  Date of Admission:  07/03/2023       Lines: Ett 5/2-c Right internal jugular Hd cath 4/23-c  Left femoral triple lumen cvc 4/22-c Tracheostomy 5/8-c  Abx: 4/17 Azith >> 4/20  4/17 CTX >> 4/22 4/18 Fluconazole  > 4/23 4/22 Zosyn  >> 5/1 4/28: Vanco>>5/05 5/1 Meropenem  >> 5/6 5/4 Voriconazole  >> 5/6  5/6 levoflox >>  Other: Neupogen  dc'ed 5/7 Dexamethasone  4/18-20 Methyl pred medrol  dose pack 3/20 for 6 days   ASSESSMENT/recs 58 yo male with left orif 2024/2024, hx hip and knee arthroplasty, tobacco use/copd, marijuanna use, ckd3, aggressive MM recent dx 06/2023, admitted 4/22 in transfer from [place] for 1 week generalized weakness found to have aki, acute on chronic anemia, confusion. 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 likely colonizer.  Has been on prolonged iv abx piptazo --> vanc/meropenem ; respiratory cx shows normal flora. Abx hasn't done much for fever curve 4/21 tte normal ef 5/5 patient aspirated; trach aspirate cx obtained negative Pjp stain  5/6 discussed with pulm team again. Tumor lysis at onset but had quickly resolved. Crrt borderline temperature. Fio2 requirement has been really low and decreased to 30% today from 50% on 5/5. Aspirated last night but otherwise stable mild  white sputum out of ett. Main issue is the cns encephalopathy. LP previously no cytology obtained (negative cx/pcr). Team has discussed plex with dr Bolling Bushy  5/6 team added midodrine  able to wean down pressors. Remains 99's temperature for most part on crrt  5/7 ams/?critical illness myopathy limiting sbt/extubation; tracheostomy planned 5/7 am cortisol 14.3; wbc normalized with daily neupogen  since 4/30 5/8 trached; cxr ?increased opacity ?fluid vs infectious 5/9 ongoing fever however pressors requirement down to 1 mcg/min; fio2 requirement remains 30%   Working dx/loose end remains same -- Legionella if present hasn't been fully treated. Changed all antimicrobial to levofloxacin  planned 7 days, starting 5/6 Fungitell elevated but nonspecific. Low suspicion for fungal infection -- aspergillus a colonizer; pjp smear negative.  Has been on neupogen  until 5/7, so increased recent wbc due to that; especially improved pressors requirement and stable fio2 ?fever from MM   -finish levoflox x7 days on 5/13 -f/u repeat trach aspirate culture sent by pulm/ccm team today 5/9 -chest ct -- if chest ct shows significant nodular changes suggestive of IFI could resume voriconazole . Deferred previously as sick stable and improving pressors and minimal fio2 requirement -left groin cvc line to be removed soon might help with fever -maintain standard isolation precaution -supportive care per pulm/oncology otherwise.  -discussed with dr Russ Course       #ams 5/2 lp normal glucose, protein, and only 1 wbc. No cytology/flow sent Suspect metabolic. 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 Ammonia level had normalized by 07/17/23 5/7 remains intubated on sedation/analgesia.  5/9 slight improvement following  some simple commands and eyes opening (on dexmedetomidate/trached 5/8)   #aki on ckd Shock, MM related and ace-I at home Crrt started here 5/6 remains on  stable low dose pressors in setting crrt   #mm #tumor lysis #hypercalcemia Aggressive cytogenetics on bone marrow pathology Chemo 4/22 and 4/25 -- high uric acid within 24 hours chemo but quickly decreased/normalized Rasburicase  preemptive prophy previouslys tarted Onc following     Principal Problem:   Generalized weakness Active Problems:   Multiple myeloma not having achieved remission (HCC)   Atrial fibrillation with rapid ventricular response (HCC)   AKI (acute kidney injury) (HCC)   Acute respiratory failure with hypoxia (HCC)   PAF (paroxysmal atrial fibrillation) (HCC)   Acute encephalopathy   Severe sepsis with septic shock (HCC)   On mechanically assisted ventilation (HCC)   Acute metabolic encephalopathy   Lobar pneumonia, unspecified organism (HCC)   Multiple myeloma without remission (HCC)   Allergies  Allergen Reactions   Morphine And Codeine Anaphylaxis   Shellfish Allergy Anaphylaxis   Betadine  [Povidone Iodine ] Itching   Bupropion Other (See Comments)    Shaking of the body, hallucinations    Chlorhexidine  Hives    Patient reports never had issues CHG with mouth rinse.   Influenza Vaccines Hives   Metoprolol  Rash    Scheduled Meds:  amiodarone   200 mg Per NG tube Daily   arformoterol   15 mcg Nebulization BID   busPIRone   15 mg Per Tube QHS   clonazePAM   1 mg Per Tube BID   docusate  100 mg Per Tube BID   epoetin  alfa  40,000 Units Subcutaneous QODAY   famotidine   10.4 mg Per Tube Daily   feeding supplement (PROSource TF20)  60 mL Per Tube TID   folic acid   1 mg Per Tube Daily   gabapentin   100 mg Per Tube Q12H   heparin  injection (subcutaneous)  5,000 Units Subcutaneous Q8H   insulin  aspart  0-20 Units Subcutaneous Q4H   insulin  aspart  8 Units Subcutaneous Q4H   insulin  glargine-yfgn  10 Units Subcutaneous BID   ipratropium-albuterol   3 mL Nebulization Q4H   lactulose   20 g Per Tube TID   leptospermum manuka honey  1 Application Topical  Daily   lidocaine -EPINEPHrine   20 mL Intradermal Once   midodrine   15 mg Per Tube TID WC   nicotine   7 mg Transdermal Daily   mouth rinse  15 mL Mouth Rinse Q2H   PARoxetine   40 mg Per Tube Daily   pneumococcal 20-valent conjugate vaccine  0.5 mL Intramuscular Tomorrow-1000   polyethylene glycol  17 g Per Tube Daily   QUEtiapine   50 mg Per Tube QHS   revefenacin   175 mcg Nebulization Daily   sodium chloride  flush  10-40 mL Intracatheter Q12H   thiamine   100 mg Per Tube Daily   Continuous Infusions:  sodium chloride  10 mL/hr at 07/25/23 1200   dexmedetomidine  (PRECEDEX ) IV infusion 0.7 mcg/kg/hr (07/25/23 1219)   feeding supplement (VITAL 1.5 CAL) 60 mL/hr at 07/25/23 1200   fentaNYL  infusion INTRAVENOUS Stopped (07/25/23 0803)   heparin  10,000 units/ 20 mL infusion syringe 1,000 Units/hr (07/25/23 0700)   levofloxacin  (LEVAQUIN ) IV Stopped (07/25/23 1113)   norepinephrine  (LEVOPHED ) Adult infusion Stopped (07/25/23 1020)   prismasol  BGK 4/2.5 400 mL/hr at 07/25/23 0400   prismasol  BGK 4/2.5 400 mL/hr at 07/25/23 0359   prismasol  BGK 4/2.5 1,500 mL/hr at 07/25/23 1145   PRN Meds:.sodium chloride , acetaminophen  (TYLENOL ) oral liquid 160 mg/5  mL **OR** acetaminophen , fentaNYL , heparin , ondansetron  (ZOFRAN ) IV, mouth rinse, oxyCODONE , polyethylene glycol, polyvinyl alcohol , sodium chloride  flush   SUBJECTIVE: Fio2 30% 5/6 cxr slight increased opacity Ongoing intermittent fever Trached 5/8  Levophed  requirement down to 23mcg/min today    Review of Systems: ROS All other ROS was negative, except mentioned above     OBJECTIVE: Vitals:   07/25/23 1145 07/25/23 1200 07/25/23 1215 07/25/23 1230  BP: (!) 141/85 138/77 121/71 (!) 128/98  Pulse: (!) 117 (!) 118 (!) 117 (!) 117  Resp: 14 14 19 12   Temp: 99.7 F (37.6 C) 99.7 F (37.6 C) 99.9 F (37.7 C) 99.9 F (37.7 C)  TempSrc:      SpO2: 100% 99% 100% 99%  Weight:      Height:       Body mass index is 29.21  kg/m.  Physical Exam General/constitutional: eyes open, but minimal interaction; per nursing staff moving upper extremities a little to commands; on dexmedetomidate/fentanyl  and 54mcg/min of levophed   On crrt  HEENT: Normocephalic, PER, Conj Clear Neck: trach in place CV: rrr no mrg Lungs: on vent 30% fio2 Abd: Soft, Nontender Ext: generalized edema Skin: No Rash Neuro: sedated MSK: no peripheral joint swelling/tenderness/warmth   Central line presence: cvc right internal jugular and groin cvc no bleeding/purulence   Lab Results Lab Results  Component Value Date   WBC 26.1 (H) 07/24/2023   HGB 8.9 (L) 07/24/2023   HCT 29.9 (L) 07/24/2023   MCV 101.4 (H) 07/24/2023   PLT 177 07/24/2023    Lab Results  Component Value Date   CREATININE 1.52 (H) 07/25/2023   BUN 36 (H) 07/25/2023   NA 133 (L) 07/25/2023   K 4.9 07/25/2023   CL 102 07/25/2023   CO2 25 07/25/2023    Lab Results  Component Value Date   ALT 14 07/23/2023   AST 19 07/23/2023   ALKPHOS 59 07/23/2023   BILITOT 0.5 07/23/2023      Microbiology: Recent Results (from the past 240 hours)  Culture, Respiratory w Gram Stain     Status: None   Collection Time: 07/16/23  2:54 PM   Specimen: Tracheal Aspirate; Respiratory  Result Value Ref Range Status   Specimen Description   Final    TRACHEAL ASPIRATE Performed at [facility], [address], [place], [place] [address]    Special Requests   Final    NONE Performed at [facility], [address] [place], [place] [address]    Gram Stain   Final    FEW WBC PRESENT, PREDOMINANTLY PMN RARE BUDDING YEAST SEEN ABUNDANT GRAM POSITIVE COCCI IN PAIRS Performed at [facility], [address], [place], [place] [address]    Culture RARE ASPERGILLUS FUMIGATUS  Final   Report Status 07/19/2023 FINAL  Final  CSF culture w Gram Stain     Status: None   Collection Time: 07/18/23  1:32 PM   Specimen: PATH Cytology  CSF; Cerebrospinal Fluid  Result Value Ref Range Status   Specimen Description   Final    CSF Performed at [facility], [address] [place], [place] [address]    Special Requests   Final    Immunocompromised Performed at [facility], [address], [place], [place] [address]    Gram Stain   Final    NO ORGANISMS SEEN RARE WBC PRESENT, PREDOMINANTLY PMN Gram Stain Report Called to,Read Back By and Verified With: RN H COCHRAN  AT 1443 07/18/23 CRUICKSHANK A GRAM STAIN REVIEWED-AGREE WITH RESULT DRT    Culture   Final    NO GROWTH 3 DAYS Performed at [facility], [address] [place], [place] [address]    Report Status 07/22/2023 FINAL  Final  Culture, Respiratory w Gram Stain     Status: None   Collection Time: 07/20/23 11:38 AM   Specimen: Tracheal Aspirate; Respiratory  Result Value Ref Range Status   Specimen Description   Final    TRACHEAL ASPIRATE Performed at [facility], [address] [place], [place] [address]    Special Requests   Final    NONE Performed at [facility], [address], [place], [place] [address]    Gram Stain   Final    ABUNDANT WBC PRESENT, PREDOMINANTLY PMN FEW SQUAMOUS EPITHELIAL CELLS PRESENT FEW GRAM NEGATIVE RODS FEW YEAST RARE GRAM POSITIVE COCCI    Culture   Final    RARE MOLD CALL MICRO IF IDENTIFICATION REQUIRED Performed at [facility], [address] [place], [place] [address]    Report Status 07/22/2023 FINAL  Final  Culture, blood (Routine X 2) w Reflex to ID Panel     Status: None (Preliminary result)   Collection Time: 07/21/23  1:27 AM   Specimen: BLOOD  Result Value Ref Range Status   Specimen Description   Final    BLOOD BLOOD LEFT HAND Performed at [facility], [address] [place], [place] [address]    Special Requests   Final    BOTTLES DRAWN AEROBIC ONLY Blood Culture results may not be  optimal due to an inadequate volume of blood received in culture bottles Performed at [facility], [address], [place], [place] [address]    Culture   Final    NO GROWTH 4 DAYS Performed at [facility], [address], [place], [place] [address]    Report Status PENDING  Incomplete  Culture, blood (Routine X 2) w Reflex to ID Panel     Status: None (Preliminary result)   Collection Time: 07/21/23  1:27 AM   Specimen: BLOOD  Result Value Ref Range Status   Specimen Description   Final    BLOOD BLOOD LEFT HAND Performed at [facility], [address] [place], [place] [address]    Special Requests   Final    BOTTLES DRAWN AEROBIC ONLY Blood Culture results may not be optimal due to an inadequate volume of blood received in culture bottles Performed at [facility], [address] [place], [place] [address]    Culture   Final    NO GROWTH 4 DAYS Performed at [facility], [address], [place], [place] [address]    Report Status PENDING  Incomplete  MRSA Next Gen by PCR, Nasal     Status: None   Collection Time: 07/21/23  8:08 AM   Specimen: Nasal Mucosa; Nasal Swab  Result Value Ref Range Status   MRSA by PCR Next Gen NOT DETECTED NOT DETECTED Final    Comment: (NOTE) The GeneXpert MRSA Assay (FDA approved for NASAL specimens only), is one component of a comprehensive MRSA colonization surveillance program. It is not intended to diagnose MRSA infection nor to guide or monitor treatment for MRSA infections. Test performance is not FDA approved in patients less than 6 years old. Performed at [facility], [address]  [place], [place] [address]   Pneumocystis smear by DFA     Status: None   Collection Time: 07/21/23  3:41 PM   Specimen: Tracheal Aspirate; Respiratory  Result Value Ref Range Status   Specimen Source-PJSRC TRACHEAL ASPIRATE  Final   Pneumocystis jiroveci Ag See  Scanned report in [place] Link  Final    Comment: Performed at [facility], [address] [place], [place] [address]     Serology:   Imaging: If present, new imagings (plain films, ct scans, and mri) have been personally visualized and interpreted; radiology reports have been reviewed. Decision making incorporated into the Impression / Recommendations.   5/8 cxr compared to 5/6 FINDINGS: Tracheostomy with tip approximately 5.5 cm above the carina. Enteric tube extends below the diaphragm with tip beyond the inferior margin of the image. Right IJ central venous line in similar position. Similar appearance of bilateral pulmonary opacities. No pleural effusion or pneumothorax. The cardiac silhouette. No acute osseous pathology.   IMPRESSION: 1. Tracheostomy with tip approximately 5.5 cm above the carina. 2. Similar appearance of bilateral pulmonary opacities.   5/6 cxr FINDINGS: Stable cardiomediastinal silhouette. Tracheostomy and feeding tubes are unchanged. Right internal jugular catheter is unchanged. Increased bilateral lung opacities are noted diffusely consistent with edema or atypical inflammation. Severe degenerative changes are seen involving both glenohumeral joints.   IMPRESSION: Stable support apparatus. Increased bilateral lung opacities concerning for edema or pneumonia.    5/2 dvt duplex bilateral lower ext, negative  5/4 chest ct noncontrast 1. Bilateral lower lobe airspace consolidation compatible with pneumonia. 2. Multifocal patchy airspace areas and ill-defined ground-glass slightly nodular opacities throughout the bilateral upper lobes and minimally in the right middle lobe, also likely infectious/inflammatory. 3. Small bilateral pleural effusions. 4. Mild cardiomegaly. 5. Cholelithiasis. 6. Scattered lucent lesions throughout the thoracic spine measuring up to 8 mm, nonspecific. Findings may be related to multiple  myeloma or metastatic disease. 7. Minimal compression deformity of the superior endplate of T11.  5/2 mri brain No acute intracranial abnormality.   Nonspecific white matter signal abnormality likely reflecting moderate chronic microvascular ischemic changes.   Multiple calvarial lesions corresponding to lytic lesions on CT. Findings concerning for multiple myeloma versus metastasis.   1.5 cm cystic lesion in the subcutaneous tissues overlying the nasal bones likely reflecting epidermoid cyst versus dermoid. No evidence of connection to the intracranial compartment.   Large right mastoid effusion.    4/21 tte  1. Left ventricular ejection fraction, by estimation, is 65 to 70%. The  left ventricle has normal function. The left ventricle has no regional  wall motion abnormalities. Left ventricular diastolic parameters are  indeterminate.   2. Right ventricular systolic function is normal. The right ventricular  size is normal. There is normal pulmonary artery systolic pressure. The  estimated right ventricular systolic pressure is 35.8 mmHg.   3. Left atrial size was moderately dilated.   4. The mitral valve is normal in structure. No evidence of mitral valve  regurgitation. No evidence of mitral stenosis.   5. The aortic valve is tricuspid. Aortic valve regurgitation is not  visualized. Aortic valve sclerosis/calcification is present, without any  evidence of aortic stenosis. Aortic valve mean gradient measures 8.0 mmHg.   6. Aortic dilatation noted. There is mild dilatation of the aortic root,  measuring 39 mm.   7. The inferior vena cava is dilated in size with <50% respiratory  variability, suggesting right atrial pressure of 15 mmHg.   8. The  patient was in atrial fibrillation.    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   Jamesetta Mcbride, MD [facility] [phone] pager    07/25/2023, 1:07 PM

## 2023-07-25 NOTE — Progress Notes (Signed)
 [facility] NEPHROLOGY PROGRESS NOTE  Assessment/ Plan: Pt is a 58 y.o. yo male with past medical history significant for HTN, HLD, type II DM, BPH, seizure who was initially presented at [facility] due to AMS, admitted for sepsis due to pneumonia, encephalopathy, sepsis, AKI, hypercalcemia symptomatic anemia and Lytic pulm lesion concern for multiple myeloma.  # AKI on CKD 3a - b/l creat 1.1- 1.5 from 2024, eGFR 53- >60 ml/min. Creat here 5.8 on presentation at OSH. AKI due to myeloma kidney +/- hyperCa +/- ACEi (home med). Started CRRT on 4/23. Pt remains anuric, and tolerating CRRT.  No further clotting after circuit heparin  increased 5/6.  Pressors weaned off as of this AM but has been off/on requiring all week.   Should he remains hemodynamically stable would plan to  hold CRRT tomorrow AM and transfer to [facility] for iHD in coming days.   # AMS/acute metabolic encephalopathy: per primary, seems to be protracted.  MRI, LP completed and unrevealing.   #Volume -appearing euvolemic now, change to net even. His hypoNa is improving with UF/CRRT.    # Shock- off pressors as of this AM, on midodrine  which I increased today  # Multiple myeloma: Oncologist started Velcade  and Cytoxan , per oncology team.   On hold for now given ongoing critical illness. Dr. Ennevar following closely.   #AHRF: s/p trach 5/8, vent weaning  # Atrial fib-  on amio.  Current in NSR  #Anemia - prn transfusions. On epogen  per onc.   # Hypophosphatemia: due to CRRT, remains low. IV Naphos prn  Palliative care involved for this critically ill patient with protracted course.   Subjective:  Seen in ICU, had trach yesterday Some agitation overnight requiring sedation but doing better this AM Pressors weaned off this AM Net 0.5L yesterday, anuric still   Objective Vital signs in last 24 hours: Vitals:   07/25/23 0830 07/25/23 0852 07/25/23 0900 07/25/23 0955  BP: 113/75  (!) 149/91   Pulse: 75  (!) 110 (!) 104 (!) 116  Resp: 18 18 15 13   Temp: (!) 100.8 F (38.2 C) (!) 100.4 F (38 C) (!) 100.4 F (38 C) 100 F (37.8 C)  TempSrc:      SpO2: 95% 98% 98% 99%  Weight:      Height:        Physical Exam: General: Critically ill looking male, intubated Heart:RRR, s1s2 nl Lungs: Coarse breath sound bilateral on vent Abdomen:soft, Non-tender, non-distended Extremities:No edema, LUE more tense now with 1+ pitting edema, R trace  Neurology: not responsive, on vent   Adrian Alba MD [facility] Pager [phone]   Recent Labs  Lab 07/23/23 0416 07/23/23 1722 07/24/23 0421 07/24/23 1630 07/25/23 0409  HGB 8.3*  --  8.9*  --   --   ALBUMIN  <1.5*  <1.5*   < > 1.6* 1.6* 1.6*  CALCIUM  7.5*   < > 7.4* 7.5* 7.6*  PHOS 1.9*  1.9*   < > 2.0*  1.5* 3.2 2.5  2.5  CREATININE 1.62*   < > 1.59* 1.53* 1.52*  K 4.2   < > 4.3 4.3 4.9   < > = values in this interval not displayed.    Inpatient medications:  amiodarone   200 mg Per NG tube Daily   arformoterol   15 mcg Nebulization BID   busPIRone   15 mg Per Tube QHS   clonazePAM   1 mg Per Tube BID   docusate  100 mg Per Tube BID  epoetin  alfa  40,000 Units Subcutaneous QODAY   famotidine   10.4 mg Per Tube Daily   feeding supplement (PROSource TF20)  60 mL Per Tube TID   folic acid   1 mg Per Tube Daily   gabapentin   100 mg Per Tube Q12H   heparin  injection (subcutaneous)  5,000 Units Subcutaneous Q8H   insulin  aspart  0-20 Units Subcutaneous Q4H   insulin  aspart  8 Units Subcutaneous Q4H   ipratropium-albuterol   3 mL Nebulization Q4H   lactulose   20 g Per Tube TID   leptospermum manuka honey  1 Application Topical Daily   lidocaine -EPINEPHrine   20 mL Intradermal Once   midodrine   15 mg Per Tube TID WC   nicotine   7 mg Transdermal Daily   mouth rinse  15 mL Mouth Rinse Q2H   PARoxetine   40 mg Per Tube Daily   pneumococcal 20-valent conjugate vaccine  0.5 mL Intramuscular Tomorrow-1000   polyethylene glycol   17 g Per Tube Daily   QUEtiapine   50 mg Per Tube QHS   revefenacin   175 mcg Nebulization Daily   sodium chloride  flush  10-40 mL Intracatheter Q12H   thiamine   100 mg Per Tube Daily    sodium chloride  10 mL/hr at 07/25/23 1011   dexmedetomidine  (PRECEDEX ) IV infusion 0.7 mcg/kg/hr (07/25/23 1031)   feeding supplement (VITAL 1.5 CAL) 60 mL/hr at 07/25/23 1000   fentaNYL  infusion INTRAVENOUS Stopped (07/25/23 0803)   heparin  10,000 units/ 20 mL infusion syringe 1,000 Units/hr (07/25/23 0700)   levofloxacin  (LEVAQUIN ) IV Stopped (07/25/23 1113)   norepinephrine  (LEVOPHED ) Adult infusion 2 mcg/min (07/25/23 1000)   prismasol  BGK 4/2.5 400 mL/hr at 07/25/23 0400   prismasol  BGK 4/2.5 400 mL/hr at 07/25/23 0359   prismasol  BGK 4/2.5 1,500 mL/hr at 07/25/23 1610   sodium chloride , acetaminophen  (TYLENOL ) oral liquid 160 mg/5 mL **OR** acetaminophen , fentaNYL , heparin , ondansetron  (ZOFRAN ) IV, mouth rinse, oxyCODONE , polyethylene glycol, polyvinyl alcohol , sodium chloride  flush

## 2023-07-25 NOTE — Progress Notes (Signed)
 Sputum sample obtained and sent to the lab at this time.

## 2023-07-25 NOTE — Progress Notes (Signed)
 PT Cancellation Note  Patient Details Name: Frank [name]. MRN: [id number] DOB: September 30, 1965   Cancelled Treatment:    Reason Eval/Treat Not Completed: Patient not medically ready  Patient S/P tracheostomy, on the vent, on CRRT, requires restraints for safety. PT will follow for appropriateness for  resuming therapy.  Abelina Hoes PT Acute Rehabilitation Services Office [phone]   Dareen Ebbing 07/25/2023, 7:48 AM

## 2023-07-26 ENCOUNTER — Inpatient Hospital Stay (HOSPITAL_COMMUNITY)

## 2023-07-26 DIAGNOSIS — G9340 Encephalopathy, unspecified: Secondary | ICD-10-CM | POA: Diagnosis not present

## 2023-07-26 DIAGNOSIS — J9601 Acute respiratory failure with hypoxia: Secondary | ICD-10-CM | POA: Diagnosis not present

## 2023-07-26 DIAGNOSIS — J90 Pleural effusion, not elsewhere classified: Secondary | ICD-10-CM | POA: Diagnosis not present

## 2023-07-26 DIAGNOSIS — R531 Weakness: Secondary | ICD-10-CM | POA: Diagnosis not present

## 2023-07-26 DIAGNOSIS — B441 Other pulmonary aspergillosis: Secondary | ICD-10-CM

## 2023-07-26 DIAGNOSIS — Z87891 Personal history of nicotine dependence: Secondary | ICD-10-CM | POA: Diagnosis not present

## 2023-07-26 LAB — BLOOD GAS, ARTERIAL
Acid-Base Excess: 1.3 mmol/L (ref 0.0–2.0)
Bicarbonate: 27.2 mmol/L (ref 20.0–28.0)
O2 Saturation: 99.2 %
Patient temperature: 37
pCO2 arterial: 47 mmHg (ref 32–48)
pH, Arterial: 7.37 (ref 7.35–7.45)
pO2, Arterial: 79 mmHg — ABNORMAL LOW (ref 83–108)

## 2023-07-26 LAB — COMPREHENSIVE METABOLIC PANEL WITH GFR
ALT: 14 U/L (ref 0–44)
AST: 16 U/L (ref 15–41)
Albumin: 1.8 g/dL — ABNORMAL LOW (ref 3.5–5.0)
Alkaline Phosphatase: 129 U/L — ABNORMAL HIGH (ref 38–126)
Anion gap: 6 (ref 5–15)
BUN: 41 mg/dL — ABNORMAL HIGH (ref 6–20)
CO2: 24 mmol/L (ref 22–32)
Calcium: 7.7 mg/dL — ABNORMAL LOW (ref 8.9–10.3)
Chloride: 104 mmol/L (ref 98–111)
Creatinine, Ser: 1.77 mg/dL — ABNORMAL HIGH (ref 0.61–1.24)
GFR, Estimated: 44 mL/min — ABNORMAL LOW (ref >60)
Glucose, Bld: 132 mg/dL — ABNORMAL HIGH (ref 70–99)
Potassium: 4.4 mmol/L (ref 3.5–5.1)
Sodium: 134 mmol/L — ABNORMAL LOW (ref 135–145)
Total Bilirubin: 0.4 mg/dL (ref 0.0–1.2)
Total Protein: 8.7 g/dL — ABNORMAL HIGH (ref 6.5–8.1)

## 2023-07-26 LAB — RENAL FUNCTION PANEL
Albumin: 1.6 g/dL — ABNORMAL LOW (ref 3.5–5.0)
Anion gap: 5 (ref 5–15)
BUN: 42 mg/dL — ABNORMAL HIGH (ref 6–20)
CO2: 24 mmol/L (ref 22–32)
Calcium: 7.7 mg/dL — ABNORMAL LOW (ref 8.9–10.3)
Chloride: 106 mmol/L (ref 98–111)
Creatinine, Ser: 1.74 mg/dL — ABNORMAL HIGH (ref 0.61–1.24)
GFR, Estimated: 45 mL/min — ABNORMAL LOW (ref >60)
Glucose, Bld: 129 mg/dL — ABNORMAL HIGH (ref 70–99)
Phosphorus: 1.5 mg/dL — ABNORMAL LOW (ref 2.5–4.6)
Potassium: 4.5 mmol/L (ref 3.5–5.1)
Sodium: 135 mmol/L (ref 135–145)

## 2023-07-26 LAB — APTT: aPTT: 36 s (ref 24–36)

## 2023-07-26 LAB — CBC
HCT: 31.3 % — ABNORMAL LOW (ref 39.0–52.0)
Hemoglobin: 8.9 g/dL — ABNORMAL LOW (ref 13.0–17.0)
MCH: 29.8 pg (ref 26.0–34.0)
MCHC: 28.4 g/dL — ABNORMAL LOW (ref 30.0–36.0)
MCV: 104.7 fL — ABNORMAL HIGH (ref 80.0–100.0)
Platelets: 214 10*3/uL (ref 150–400)
RBC: 2.99 MIL/uL — ABNORMAL LOW (ref 4.22–5.81)
RDW: 19.9 % — ABNORMAL HIGH (ref 11.5–15.5)
WBC: 29.2 10*3/uL — ABNORMAL HIGH (ref 4.0–10.5)
nRBC: 3.6 % — ABNORMAL HIGH (ref 0.0–0.2)

## 2023-07-26 LAB — CULTURE, BLOOD (ROUTINE X 2)
Culture: NO GROWTH
Culture: NO GROWTH

## 2023-07-26 LAB — GLUCOSE, CAPILLARY
Glucose-Capillary: 127 mg/dL — ABNORMAL HIGH (ref 70–99)
Glucose-Capillary: 130 mg/dL — ABNORMAL HIGH (ref 70–99)
Glucose-Capillary: 143 mg/dL — ABNORMAL HIGH (ref 70–99)
Glucose-Capillary: 145 mg/dL — ABNORMAL HIGH (ref 70–99)
Glucose-Capillary: 145 mg/dL — ABNORMAL HIGH (ref 70–99)
Glucose-Capillary: 182 mg/dL — ABNORMAL HIGH (ref 70–99)

## 2023-07-26 LAB — PHOSPHORUS: Phosphorus: 1.9 mg/dL — ABNORMAL LOW (ref 2.5–4.6)

## 2023-07-26 LAB — MAGNESIUM: Magnesium: 2.9 mg/dL — ABNORMAL HIGH (ref 1.7–2.4)

## 2023-07-26 MED ORDER — VORICONAZOLE 200 MG IV SOLR
6.0000 mg/kg | Freq: Two times a day (BID) | INTRAVENOUS | Status: AC
  Administered 2023-07-26 (×2): 600 mg via INTRAVENOUS
  Filled 2023-07-26 (×2): qty 600

## 2023-07-26 MED ORDER — SODIUM CHLORIDE 0.9 % IV SOLN
INTRAVENOUS | Status: AC | PRN
Start: 2023-07-26 — End: 2023-07-27

## 2023-07-26 MED ORDER — DEXMEDETOMIDINE HCL IN NACL 400 MCG/100ML IV SOLN
0.0000 ug/kg/h | INTRAVENOUS | Status: DC
Start: 2023-07-26 — End: 2023-07-27
  Administered 2023-07-26: 0.8 ug/kg/h via INTRAVENOUS
  Administered 2023-07-26 – 2023-07-27 (×2): 0.6 ug/kg/h via INTRAVENOUS
  Filled 2023-07-26 (×3): qty 100

## 2023-07-26 MED ORDER — SODIUM CHLORIDE 0.9 % IV SOLN
3.0000 g | Freq: Three times a day (TID) | INTRAVENOUS | Status: DC
  Administered 2023-07-26 – 2023-07-27 (×3): 3 g via INTRAVENOUS
  Filled 2023-07-26 (×3): qty 8

## 2023-07-26 MED ORDER — MIDODRINE HCL 5 MG PO TABS: 10.0000 mg | ORAL_TABLET | Freq: Three times a day (TID) | ORAL | Status: DC

## 2023-07-26 MED ORDER — VORICONAZOLE 200 MG IV SOLR
4.0000 mg/kg | Freq: Two times a day (BID) | INTRAVENOUS | Status: DC
  Administered 2023-07-27 (×2): 400 mg via INTRAVENOUS
  Filled 2023-07-26 (×6): qty 400

## 2023-07-26 MED ORDER — SODIUM CHLORIDE 0.9 % IV SOLN
3.0000 g | Freq: Two times a day (BID) | INTRAVENOUS | Status: DC
  Administered 2023-07-26: 3 g via INTRAVENOUS
  Filled 2023-07-26: qty 8

## 2023-07-26 MED ORDER — MIDAZOLAM HCL 2 MG/2ML IJ SOLN
2.0000 mg | INTRAMUSCULAR | Status: DC | PRN
  Administered 2023-07-26 (×2): 2 mg via INTRAVENOUS
  Filled 2023-07-26 (×2): qty 2

## 2023-07-26 MED ORDER — MIDODRINE HCL 5 MG PO TABS
10.0000 mg | ORAL_TABLET | Freq: Three times a day (TID) | ORAL | Status: DC
  Administered 2023-07-26 – 2023-07-31 (×16): 10 mg
  Filled 2023-07-26 (×17): qty 2

## 2023-07-26 MED ORDER — MIDAZOLAM-SODIUM CHLORIDE 100-0.9 MG/100ML-% IV SOLN
0.5000 mg/h | INTRAVENOUS | Status: DC
  Administered 2023-07-26: 0.5 mg/h via INTRAVENOUS
  Filled 2023-07-26: qty 100

## 2023-07-26 MED ORDER — NOREPINEPHRINE 16 MG/250ML-% IV SOLN
0.0000 ug/min | INTRAVENOUS | Status: DC
  Administered 2023-07-26 – 2023-07-29 (×2): 2 ug/min via INTRAVENOUS
  Filled 2023-07-26: qty 250

## 2023-07-26 MED ORDER — IPRATROPIUM-ALBUTEROL 0.5-2.5 (3) MG/3ML IN SOLN
3.0000 mL | RESPIRATORY_TRACT | Status: DC | PRN
Start: 2023-07-26 — End: 2023-10-05
  Administered 2023-09-21 – 2023-10-04 (×5): 3 mL via RESPIRATORY_TRACT
  Filled 2023-07-26 (×5): qty 3

## 2023-07-26 MED ORDER — HEPARIN SODIUM (PORCINE) 1000 UNIT/ML DIALYSIS
1000.0000 [IU] | INTRAMUSCULAR | Status: DC | PRN
  Administered 2023-07-26: 2400 [IU] via INTRAVENOUS_CENTRAL
  Filled 2023-07-26: qty 6

## 2023-07-26 MED ORDER — SODIUM PHOSPHATES 45 MMOLE/15ML IV SOLN
30.0000 mmol | Freq: Once | INTRAVENOUS | Status: AC
  Administered 2023-07-26: 30 mmol via INTRAVENOUS
  Filled 2023-07-26: qty 10

## 2023-07-26 MED ORDER — SODIUM CHLORIDE 3 % IN NEBU
4.0000 mL | INHALATION_SOLUTION | Freq: Every day | RESPIRATORY_TRACT | Status: AC
Start: 2023-07-26 — End: 2023-07-29
  Administered 2023-07-26 – 2023-07-28 (×3): 4 mL via RESPIRATORY_TRACT
  Filled 2023-07-26: qty 4
  Filled 2023-07-26: qty 15
  Filled 2023-07-26: qty 4

## 2023-07-26 NOTE — Progress Notes (Signed)
 Pt transported to MRI on vent and back with no complications.

## 2023-07-26 NOTE — Progress Notes (Signed)
 PHARMACY NOTE:  ANTIMICROBIAL RENAL DOSAGE ADJUSTMENT  Current antimicrobial regimen includes a mismatch between antimicrobial dosage and estimated renal function.  As per policy approved by the Pharmacy & Therapeutics and Medical Executive Committees, the antimicrobial dosage will be adjusted accordingly.  Current antimicrobial dosage:   Levaquin  500 mg IV q24h Unasyn 3 g IV q12h  Indication: pneumonia  Renal Function:  Estimated Creatinine Clearance: 55.2 mL/min (A) (by C-G formula based on SCr of 1.77 mg/dL (H)). []      On intermittent HD, scheduled: []      On CRRT    Antimicrobial dosage has been changed to:   Continue Levaquin  500 mg IV q24h Change Unasyn to 3 g IV q8h  Additional comments: 5/10 Per Nephrology note, d/c CRRT and have pt transferred to [facility] for iHD, to start on Monday/ Tuesday.   Thank you for allowing pharmacy to be a part of this patient's care.  Delpha Fickle, [facility] 07/26/2023 2:19 PM

## 2023-07-26 NOTE — Progress Notes (Signed)
 eLink Physician-Brief Progress Note Patient Name: Frank [name]. DOB: Mar 10, 1966 MRN: [id number]   Date of Service  07/26/2023  HPI/Events of Note  Mechanically vented, poor safety awareness  eICU Interventions  Renew restraints for patient's safety     Intervention Category Minor Interventions: Routine modifications to care plan (e.g. PRN medications for pain, fever)  Oriyah Lamphear 07/26/2023, 10:52 PM

## 2023-07-26 NOTE — Progress Notes (Signed)
 Overall, it is hard to say if how well Mr. Schickling is doing.  He still has quite a few issues.  From a myeloma perspective, he has responded very nicely to very little chemotherapy.  His monoclonal spike is only 3.1 g/dL.  His IgG level went from 11,000 g/dL down to 4696 grams per deciliter.  He has a trach.  His white cell count is quite high.  I think all cultures have been negative.  I think he had some gram-positive cocci in respiratory secretions.  His white cell count is 29.2.  Hemoglobin 8.9.  Platelet count 214,000.  Sodium 134.  Potassium 4.4.  BUN 41 creatinine 1.77.  Calcium  7.7 with albumin  of 1.8.  His total protein is 8.7.  This has been going up a little bit.  It is conceivable that we may have to think about another round of treatment for him.  I know he had been getting Neupogen .  This could be why the white cell count is so high.  Again cultures have been unremarkable.  He has been having quite a bit of diarrhea.  I think having getting some lactulose .  His vital signs show a temperature of 100.  Pulse 101.  Blood pressure 152/94.[name]  He has had temperature of 102.2.  Again it is hard to figure out why he is having these temperature spikes.  He is on antibiotics.  Typically, myeloma does not have fevers as a paraneoplastic syndrome.  I think he is off pressors right now.  His pressure is on the high side actually.  We will continue to follow along.  Again, we may have to get him back on some chemotherapy next week if we see that his protein levels trend upward.  [name], MD  [facility] Cornea 1:5

## 2023-07-26 NOTE — Progress Notes (Signed)
 Pharmacy Antibiotic Note  [name] is a 58 y.o. male admitted on 07/03/2023 with generalized weakness (multifactorial secondary to hypercalcemia, suspected malignancy, multiple myeloma).  On antibiotics since admission.  Was on voriconazole  5/4-5/6.  Currently on levofloxacin  for HCAP and Unasyn for aspiration pneumonia.  Today, Pharmacy has been consulted to add back voriconazole  dosing per ID for IFI in the context of persistent fever, chest CT increasing nodular opacity, and sputum culture with aspergillus.  .  Plan: Voriconazole  600 mg IV twice daily for 2 doses, then 400 mg IV twice daily; no renal dose adjustment necessary while on CRRT Monitor clinical progress, electrolytes, renal function, hepatic function, serum trough concentrations at steady state if indicated     Height: 6' 0.01" (182.9 cm) Weight: 100.6 kg (221 lb 12.5 oz) IBW/kg (Calculated) : 77.62  Temp (24hrs), Avg:100 F (37.8 C), Min:99 F (37.2 C), Max:100.6 F (38.1 C)  Recent Labs  Lab 07/21/23 0540 07/21/23 1700 07/22/23 0457 07/22/23 1641 07/23/23 0416 07/23/23 1722 07/24/23 0421 07/24/23 1630 07/25/23 0409 07/25/23 1508 07/26/23 0423  WBC 0.7*  --  2.3*  --  12.6*  --  26.1*  --   --   --  29.2*  CREATININE 1.79*   < >  --    < > 1.62*   < > 1.59* 1.53* 1.52* 1.62* 1.77*  1.74*   < > = values in this interval not displayed.    Estimated Creatinine Clearance: 55.2 mL/min (A) (by C-G formula based on SCr of 1.77 mg/dL (H)).    Allergies  Allergen Reactions   Morphine And Codeine Anaphylaxis   Shellfish Allergy Anaphylaxis   Betadine  [Povidone Iodine ] Itching   Bupropion Other (See Comments)    Shaking of the body, hallucinations    Chlorhexidine  Hives    Patient reports never had issues CHG with mouth rinse.   Influenza Vaccines Hives   Metoprolol  Rash    Antimicrobials this admission: 4/17 Azith >> 4/20  4/17 CTX >> 4/22 4/18 Fluconazole  > 4/23 4/22 Zosyn  >> 5/1 4/28:  Vanco>>5/05 5/1 Meropenem  >> 5/6 5/4 Voriconazole  >> 5/6,  added back 5/10 >> 5/6 levoflox >> 5/10 Unasyn >> (5/17)  Microbiology results: 4/19: MRSA PCR: neg 4/19 UCx: ngF 4/19 BCx: ngF 4/23 TA: few Staph epi: MRSE 4/28 BCx: ngF 4/30 TA: rare aspergillus fumigatus 5/2 CSF: ngtd 5/4 tracheal asp: rare mold 5/5 TA PJP stain: neg 5/5 MRSA PCR: neg 5/5 BCx: NGTD 5/9 tracheal asp: pending  Thank you for allowing pharmacy to be a part of this patient's care.  Alfredo Inch, PharmD, BCPS Clinical Pharmacist [place] 07/26/2023 9:00 AM

## 2023-07-26 NOTE — Progress Notes (Signed)
 A Consult was placed to the hospital's IV Nurse for additional iv access; pt has HDC with a pigtail, but additional access is needed for medications;  pt with extremely limited access;  R arm only; pt noted to be restrained; many of his veins do not compress;  was able to place an IV with ultrasound on the 1 vein that was noted, and that did compress;  suggest other central access if the pt requires it.  Thank you.

## 2023-07-26 NOTE — Progress Notes (Signed)
 [facility] NEPHROLOGY PROGRESS NOTE   Subjective:  Seen in ICU Anuric still Remains off pressors   Objective Vital signs in last 24 hours: Vitals:   07/26/23 1100 07/26/23 1125 07/26/23 1130 07/26/23 1200  BP: (!) 179/96 (!) 146/85 115/72 (!) 83/50  Pulse: (!) 105 96 93 76  Resp: 16 20 20 20   Temp:    99.8 F (37.7 C)  TempSrc:    Axillary  SpO2: 100% 98% 98% 99%  Weight:      Height:        Physical Exam: General: Critically ill looking male, intubated Heart:RRR, s1s2 nl Lungs: Coarse breath sound bilateral on vent Abdomen:soft, Non-tender, non-distended Extremities:No edema, LUE more tense now with 1+ pitting edema, R trace  Neurology: not responsive, on vent     Assessment/ Plan: Pt is a 58 y.o. yo male with past medical history significant for HTN, HLD, type II DM, BPH, seizure who was initially presented at [facility] due to AMS, admitted for sepsis due to pneumonia, encephalopathy, sepsis, AKI, hypercalcemia symptomatic anemia and Lytic pulm lesion concern for multiple myeloma.  # AKI on CKD 3a - b/l creat 1.1- 1.5 from 2024, eGFR 53- >60 ml/min. Creat here 5.8 on presentation at OSH. AKI due to myeloma kidney +/- hyperCa +/- ACEi (home med). Started CRRT on 4/23. Pt remains anuric, and tolerating CRRT.  No further clotting after circuit heparin  increased 5/6.  Pressors weaned off. Remains hemodynamically stable. Will d/c CRRT and have pt transferred to [facility] for iHD, to start on Monday/ Tuesday.   # AMS/acute metabolic encephalopathy: per primary, seems to be protracted.  MRI, LP completed and unrevealing.   #Volume -appearing euvolemic now  # Shock- off pressors. On midodrine  10 tid now.   # Multiple myeloma: Oncologist started Velcade  and Cytoxan , per oncology team.   On hold for now given ongoing critical illness.  #AHRF: s/p trach 5/8, vent weaning  # Atrial fib-  on amio.  Current in NSR  #Anemia - prn transfusions. On epogen  per  onc.   # Hypophosphatemia: remains low. IV Naphos prn  Palliative care involved for this critically ill patient with protracted course.     Larry Poag  MD  [facility] 07/26/2023, 1:31 PM  Recent Labs  Lab 07/24/23 0421 07/24/23 1630 07/25/23 1508 07/26/23 0423  HGB 8.9*  --   --  8.9*  ALBUMIN  1.6*   < > 1.6* 1.8*  1.6*  CALCIUM  7.4*   < > 7.7* 7.7*  7.7*  PHOS 2.0*  1.5*   < > RESULTS UNAVAILABLE DUE TO INTERFERING SUBSTANCE 1.9*  1.5*  CREATININE 1.59*   < > 1.62* 1.77*  1.74*  K 4.3   < > 4.6 4.4  4.5   < > = values in this interval not displayed.    Inpatient medications:  amiodarone   200 mg Per NG tube Daily   arformoterol   15 mcg Nebulization BID   busPIRone   15 mg Per Tube QHS   clonazePAM   1 mg Per Tube BID   docusate  100 mg Per Tube BID   epoetin  alfa  40,000 Units Subcutaneous QODAY   famotidine   10.4 mg Per Tube Daily   feeding supplement (PROSource TF20)  60 mL Per Tube TID   folic acid   1 mg Per Tube Daily   gabapentin   100 mg Per Tube Q12H   heparin  injection (subcutaneous)  5,000 Units Subcutaneous Q8H   insulin  aspart  0-20 Units Subcutaneous  Q4H   insulin  aspart  8 Units Subcutaneous Q4H   insulin  glargine-yfgn  10 Units Subcutaneous BID   lactulose   20 g Per Tube TID   leptospermum manuka honey  1 Application Topical Daily   lidocaine -EPINEPHrine   20 mL Intradermal Once   midodrine   10 mg Per Tube TID WC   nicotine   7 mg Transdermal Daily   mouth rinse  15 mL Mouth Rinse Q2H   PARoxetine   40 mg Per Tube Daily   pneumococcal 20-valent conjugate vaccine  0.5 mL Intramuscular Tomorrow-1000   polyethylene glycol  17 g Per Tube Daily   QUEtiapine   50 mg Per Tube QHS   revefenacin   175 mcg Nebulization Daily   sodium chloride  flush  10-40 mL Intracatheter Q12H   sodium chloride  HYPERTONIC  4 mL Nebulization Daily   thiamine   100 mg Per Tube Daily    ampicillin-sulbactam (UNASYN) IV Stopped (07/26/23 1610)   dexmedetomidine  (PRECEDEX ) IV infusion  1.2 mcg/kg/hr (07/26/23 1300)   feeding supplement (VITAL 1.5 CAL) 60 mL/hr at 07/26/23 1300   fentaNYL  infusion INTRAVENOUS 200 mcg/hr (07/26/23 1300)   levofloxacin  (LEVAQUIN ) IV 100 mL/hr at 07/26/23 1300   midazolam      norepinephrine  (LEVOPHED ) Adult infusion 2 mcg/min (07/26/23 1300)   sodium PHOSPHATE  IVPB (in mmol)     voriconazole  Stopped (07/26/23 1225)   Followed by   [name] ON 07/27/2023] voriconazole      acetaminophen  (TYLENOL ) oral liquid 160 mg/5 mL **OR** acetaminophen , fentaNYL , ipratropium-albuterol , midazolam , ondansetron  (ZOFRAN ) IV, mouth rinse, oxyCODONE , polyethylene glycol, polyvinyl alcohol , sodium chloride  flush

## 2023-07-26 NOTE — Progress Notes (Signed)
 NAME:  Daisy Attwood., MRN:  [id number], DOB:  Mar 22, 1965, LOS: 23 ADMISSION DATE:  07/03/2023, CONSULTATION DATE:  07/06/23 REFERRING MD:  Dr Maury Space, CHIEF COMPLAINT:  Encephalopathy   History of Present Illness:   PCCM asked to see patient for encephalopathy   Transferred from [facility] with 1 week of generalized weakness, altered mentation, decreased appetite Recently being worked up for multiple myeloma.  Transferred to [facility] for oncology evaluation Nephrology consulted for AKI   Background history of obesity, asthma, obstructive sleep apnea on CPAP GERD, hypertension, hyperlipidemia Has been feeling weak for the last few days according to spouse Admitted with concern for sepsis, community-acquired pneumonia, antibiotic encephalopathy Workup revealed lytic bony lesions with concern for multiple myeloma Transfused with 3 units packed red blood cells Evaluation so far-bone marrow 07/04/2023  Pertinent  Medical History   Past Medical History:  Diagnosis Date   Anxiety    Arthritis    Asthma    Bipolar disorder (HCC)    Current every day smoker    Depression    Diabetes mellitus, type II (HCC)    Dyspnea    History of kidney stones    History of pneumonia    Hyperlipidemia    Hypertension    Morbid obesity (HCC)    Sedentary lifestyle    Seizures (HCC)    last seizure 12 yrs ago, no current problem   Sleep apnea    uses CPAP nightly   Significant Hospital Events: Including procedures, antibiotic start and stop dates in addition to other pertinent events   4/19-blood cultures, MRSA PCR negative 4/19 chest x-ray with mild pulmonary congestion 4/20 atrial fibrillation with RVR 4/21 nosebleed after IR NG attempt, NG placed by PCCM in afternoon, stable transferred to [facility] 4/22 intubated; some abla requiring prbcs; ct abd/pelvis showing acute groin hematoma tracking from posterior presumed from site of bone marrow biopsy; heparin  held EEG 4/25 >> moderate to severe  encephalopathy without any seizures or epileptiform discharges 4/23 respiratory culture >> MRSE 4/28 remains intubated on CRRT, down to one pressor  4/29 labile BP, NE back up. Responded a bit to fluids. Received Velcade  and Cytoxan  chemotherapy 5/1 remains encephalopathic, check CTH 5/2 LP ETT exchange  5/5 1 PRBC. Palliative care met w family  5/6 IVIG. plan for trach established. Added midodrine  after failed vaso wean. Abx/fungal changed to levaquin   5/7 weaning pressors incr midodrine  incr lactulose . Vaso off  5/8 Trach placed. Some post op bleeding improved with thrombi-pad  Interim History / Subjective:   Tolerated trach collar yesterday Remains encephalopathic  CT Chest showing progressive ground-glass and air space densities along with nodular densities with surrounding ground-glass attenuation.   Objective   Blood pressure (!) 153/95, pulse 91, temperature 99 F (37.2 C), resp. rate (!) 21, height 6' 0.01" (1.829 m), weight 100.6 kg, SpO2 96%.    Vent Mode: PRVC FiO2 (%):  [30 %-40 %] 30 % Set Rate:  [20 bmp] 20 bmp Vt Set:  [540 mL] 540 mL PEEP:  [5 cmH20] 5 cmH20 Plateau Pressure:  [15 cmH20] 15 cmH20   Intake/Output Summary (Last 24 hours) at 07/26/2023 0731 Last data filed at 07/26/2023 0700 Gross per 24 hour  Intake 3730.05 ml  Output 4449 ml  Net -718.95 ml   Filed Weights   07/24/23 2200 07/25/23 0434 07/26/23 0500  Weight: 97.6 kg 97.7 kg 100.6 kg     Physical Exam: General: Critically ill appearing middle aged male HEENT: [place]/AT, no JVD.  Trach in place Neuro:  spontaneous movement of extremities, PERL- very sluggish  CV: tachycardic, no MRG PULM:  Coarse bilaterally. Thick secretions. GI: Soft, NT, ND Extremities:  trace edema  __________________________________________________________  Micro: 4/28 BCx > no growth, final 4/30 respiratory culture-few PMN, rare budding yeast, abundant GPC> few aspergillus fumigatus Quantiferon 5/2 > neg Trach asp  5/4> rare GPC, rare mold  PCP smear trach asp 5/5 > neg  Trach asp 5/9 > abundant gram positive cocci, few budding yeast  CSF studies 1 RBC, 1 WBC, glucose 106 CSF culture-2 organisms acute, rare WBC> NGTD Cryptococcal antigen negative meningitis and encephalitis panel negative VDRL  non-reactive   AFB CSF 5/2 >>>> Resolved Problems  Shock   Assessment & Plan:   Acute encephalopathy Hx seizure disorder Hyperammonemia P -TID lactulose , trend ammonia. 49 this morning -delirium precautions  -Home paxil  and gabapentin  continue -holding home VPA w ammonia issues  - Check MRI brain  Acute respiratory failure w hypoxia Aspergillus Fumigatus and Aspiration Pneumonia Small bilateral pleural effusions Hx OSA Hx tobacco use  -originally aspiration of blood 2/2 epistaxis -MSSE PNA -aspirgillus fumigatus +  -pjp neg  P - Trach collar as tolerated - Continue brovana  BID and yupelri  daily - PRN duonebs - add hypertonic saline nebs for airway clearance - Unasyn, levaquin  and voriconazole  per ID - follow up tracheal aspirate cultures  Sepsis due to Pneumonia  -MSSE PNA -MRSA PCR neg 5/5  - aspergillus fumigatus on tracheal aspirate P - Continue midodrine  - continue levaquin , unasyn and voriconazole  per ID  Multiple myeloma  Neutropenia, possible neutropenic fever -- now slight leukocytosis  TLS, improved  -appreciate Oncology's management; received Velcade  and Cytotoxan 4/29,  Velcade  held 5/2. Sounds like velcade  and cytoxan  would not usually catalyze this sort of neutropenia. -got IVIG 5/6 -5/9 WBC swing from 2  > 26 s/p G-CSF P -onc following-- chemo is currently on hold w other ongoing issues  AKI on CKD 3a  Hyponatremia  Hypophosphatemia  P -RRT per nephro  -Will likely stop CRRT in the next 24 hours -If deemed to still need iHD will need transfer to [facility].   PAF  -now sinus  P - amio 200mg  every day per tube x 14d > 5/20 -AC was deferred w bleeding issues  this admission, now that plt are improving could consider AC should he go back into fib  Anemia, multifactorial  Thrombocytopenia, improving  -ABLA 2/2 R groin hematoma after bmbx -- stable.  -critical illness -MM  -possible drug-related  P -Follow CBC daily -goal hgb> 7   DM2 w hyperglycemia -was on Bid glargine 20 u + rSSI + 8u novolog  q4 prior to emesis/aspiration 5/6 after which EN was held and gradually increased P - rSSI + EN coverage. - glargine 10u BID   GOC: See IPAL from 5/7. Wife now updating goals to include 2-3 rounds of CPR maximum should he arrest.  Best Practice (right click and "Reselect all SmartList Selections" daily)   Diet/type: tubefeeds  DVT prophylaxis prophylactic heparin   Pressure ulcer(s): identified on: 19/Apr/2025 GI prophylaxis: H2B Lines: Dialysis Catheter Foley:  na  Code Status:  full code Last date of multidisciplinary goals of care discussion d/w wife 5/7. See IPAL  Critical care time 45 minutes  Duaine German, MD [facility] Office: [phone]   See Amion for personal pager PCCM on call pager [phone] until 7pm. Please call Elink 7p-7a. [phone]

## 2023-07-26 NOTE — Plan of Care (Addendum)
 Id brief note  Chest ct reviewed Much more increased bilateral airspace/nodular opacities   Ongoing fever despite abx  Has had neupogen  (last dose 5/7) and rising wbc  Fio2 requirement stable 30% with peep 5   Also on amiodarone       A/p Mm s/p 2 doses chemo by 4/25 Neutropenia recovered Ams Aki on crrt Hypoxic resp failure s/p trach Pneumonia Afib on amio     In the context of persistent fever and chest ct increasing nodular opacity and sputum cx aspergillus, will start on voriconazole   Brain mri to r/o disseminated process  Finish levoflox  Will broaden abx to cover for the gpc while cx cooking;. His mrsa nares pcr negative --> will do amp-sulbactam for 7 days    He is on amiodarone  which could also be associated with pulmonary toxicity.... if voriconazole  on board and continues to have fever or respiratory distress, would consider this possibility   Discussed with dr Gillian Lacrosse (ID on call for this weekend) and dr Diania Fortes of pulm ccm

## 2023-07-27 DIAGNOSIS — Z87891 Personal history of nicotine dependence: Secondary | ICD-10-CM | POA: Diagnosis not present

## 2023-07-27 DIAGNOSIS — J189 Pneumonia, unspecified organism: Secondary | ICD-10-CM | POA: Diagnosis not present

## 2023-07-27 DIAGNOSIS — D709 Neutropenia, unspecified: Secondary | ICD-10-CM | POA: Diagnosis not present

## 2023-07-27 DIAGNOSIS — J90 Pleural effusion, not elsewhere classified: Secondary | ICD-10-CM | POA: Diagnosis not present

## 2023-07-27 DIAGNOSIS — J9601 Acute respiratory failure with hypoxia: Secondary | ICD-10-CM | POA: Diagnosis not present

## 2023-07-27 DIAGNOSIS — G9340 Encephalopathy, unspecified: Secondary | ICD-10-CM | POA: Diagnosis not present

## 2023-07-27 LAB — COMPREHENSIVE METABOLIC PANEL WITH GFR
ALT: 10 U/L (ref 0–44)
AST: 12 U/L — ABNORMAL LOW (ref 15–41)
Albumin: 1.6 g/dL — ABNORMAL LOW (ref 3.5–5.0)
Alkaline Phosphatase: 119 U/L (ref 38–126)
Anion gap: 7 (ref 5–15)
BUN: 57 mg/dL — ABNORMAL HIGH (ref 6–20)
CO2: 24 mmol/L (ref 22–32)
Calcium: 7.6 mg/dL — ABNORMAL LOW (ref 8.9–10.3)
Chloride: 105 mmol/L (ref 98–111)
Creatinine, Ser: 2.46 mg/dL — ABNORMAL HIGH (ref 0.61–1.24)
GFR, Estimated: 29 mL/min — ABNORMAL LOW (ref >60)
Glucose, Bld: 184 mg/dL — ABNORMAL HIGH (ref 70–99)
Potassium: 5.1 mmol/L (ref 3.5–5.1)
Sodium: 136 mmol/L (ref 135–145)
Total Bilirubin: 0.6 mg/dL (ref 0.0–1.2)
Total Protein: 8.1 g/dL (ref 6.5–8.1)

## 2023-07-27 LAB — CBC WITH DIFFERENTIAL/PLATELET
Abs Immature Granulocytes: 7.93 10*3/uL — ABNORMAL HIGH (ref 0.00–0.07)
Basophils Absolute: 0.1 10*3/uL (ref 0.0–0.1)
Basophils Relative: 0 %
Eosinophils Absolute: 0.2 10*3/uL (ref 0.0–0.5)
Eosinophils Relative: 1 %
HCT: 29.5 % — ABNORMAL LOW (ref 39.0–52.0)
Hemoglobin: 8.6 g/dL — ABNORMAL LOW (ref 13.0–17.0)
Immature Granulocytes: 30 %
Lymphocytes Relative: 9 %
Lymphs Abs: 2.5 10*3/uL (ref 0.7–4.0)
MCH: 30.1 pg (ref 26.0–34.0)
MCHC: 29.2 g/dL — ABNORMAL LOW (ref 30.0–36.0)
MCV: 103.1 fL — ABNORMAL HIGH (ref 80.0–100.0)
Monocytes Absolute: 1.4 10*3/uL — ABNORMAL HIGH (ref 0.1–1.0)
Monocytes Relative: 5 %
Neutro Abs: 14.2 10*3/uL — ABNORMAL HIGH (ref 1.7–7.7)
Neutrophils Relative %: 55 %
Platelets: 244 10*3/uL (ref 150–400)
RBC: 2.86 MIL/uL — ABNORMAL LOW (ref 4.22–5.81)
RDW: 20.5 % — ABNORMAL HIGH (ref 11.5–15.5)
WBC: 26.2 10*3/uL — ABNORMAL HIGH (ref 4.0–10.5)
nRBC: 6.3 % — ABNORMAL HIGH (ref 0.0–0.2)

## 2023-07-27 LAB — GLUCOSE, CAPILLARY
Glucose-Capillary: 116 mg/dL — ABNORMAL HIGH (ref 70–99)
Glucose-Capillary: 117 mg/dL — ABNORMAL HIGH (ref 70–99)
Glucose-Capillary: 126 mg/dL — ABNORMAL HIGH (ref 70–99)
Glucose-Capillary: 127 mg/dL — ABNORMAL HIGH (ref 70–99)
Glucose-Capillary: 151 mg/dL — ABNORMAL HIGH (ref 70–99)
Glucose-Capillary: 158 mg/dL — ABNORMAL HIGH (ref 70–99)
Glucose-Capillary: 177 mg/dL — ABNORMAL HIGH (ref 70–99)

## 2023-07-27 LAB — HEPATITIS B SURFACE ANTIGEN: Hepatitis B Surface Ag: NONREACTIVE

## 2023-07-27 LAB — TRIGLYCERIDES: Triglycerides: 143 mg/dL (ref <150)

## 2023-07-27 LAB — PHOSPHORUS: Phosphorus: 1.9 mg/dL — ABNORMAL LOW (ref 2.5–4.6)

## 2023-07-27 MED ORDER — NAPROXEN 250 MG PO TABS
250.0000 mg | ORAL_TABLET | Freq: Two times a day (BID) | ORAL | Status: AC
  Administered 2023-07-27 – 2023-07-29 (×5): 250 mg
  Filled 2023-07-27 (×7): qty 1

## 2023-07-27 MED ORDER — VANCOMYCIN HCL 2000 MG/400ML IV SOLN
2000.0000 mg | Freq: Once | INTRAVENOUS | Status: AC
  Administered 2023-07-27: 2000 mg via INTRAVENOUS
  Filled 2023-07-27: qty 400

## 2023-07-27 MED ORDER — NEPRO/CARBSTEADY PO LIQD
1000.0000 mL | ORAL | Status: DC
  Administered 2023-07-28: 1000 mL

## 2023-07-27 MED ORDER — PROSOURCE TF20 ENFIT COMPATIBL EN LIQD
60.0000 mL | Freq: Every day | ENTERAL | Status: DC
  Administered 2023-07-28 – 2023-07-29 (×2): 60 mL
  Filled 2023-07-27 (×2): qty 60

## 2023-07-27 MED ORDER — NEPRO/CARBSTEADY PO LIQD
1000.0000 mL | ORAL | Status: DC
  Administered 2023-07-27: 1000 mL
  Filled 2023-07-27: qty 1185
  Filled 2023-07-27: qty 1000

## 2023-07-27 MED ORDER — OXYCODONE HCL 5 MG PO TABS
5.0000 mg | ORAL_TABLET | Freq: Four times a day (QID) | ORAL | Status: DC
  Administered 2023-07-27 – 2023-07-31 (×16): 5 mg
  Filled 2023-07-27 (×17): qty 1

## 2023-07-27 NOTE — Progress Notes (Signed)
 [name] NEPHROLOGY PROGRESS NOTE   Subjective:  Seen in ICU, off pressors I/O yest +360 cc I/O today + 2.8 L so far   Objective Vital signs in last 24 hours: Vitals:   07/26/23 1100 07/26/23 1125 07/26/23 1130 07/26/23 1200  BP: (!) 179/96 (!) 146/85 115/72 (!) 83/50  Pulse: (!) 105 96 93 76  Resp: 16 20 20 20   Temp:    99.8 F (37.7 C)  TempSrc:    Axillary  SpO2: 100% 98% 98% 99%  Weight:      Height:        Physical Exam: General: Critically ill looking male, intubated Heart:RRR, s1s2 nl Lungs: Coarse breath sound bilateral on vent Abdomen:soft, Non-tender, non-distended Extremities:No edema, LUE more tense now with 1+ pitting edema, R trace  Neurology: not responsive, on vent     Assessment/ Plan: Pt is a 58 y.o. yo male with past medical history significant for HTN, HLD, type II DM, BPH, seizure who was initially presented at [facility] due to AMS, admitted for sepsis due to pneumonia, encephalopathy, sepsis, AKI, hypercalcemia symptomatic anemia and Lytic pulm lesion concern for multiple myeloma.  Problems # AKI on CKD 3a - b/l creat 1.1- 1.5 from 2024, eGFR 53- >60 ml/min. Creat here 5.8 on presentation at OSH. AKI due to myeloma kidney +/- hyperCa +/- ACEi (home med). Started CRRT on 4/23. Pt remains anuric, and tolerating CRRT.  No further clotting after circuit heparin  increased 5/6.  Pressors weaned off. Remains hemodynamically stable. Will d/c CRRT and have pt transferred to [facility] for iHD, to start on Monday.   # AMS/acute metabolic encephalopathy: per primary, seems to be protracted.  MRI, LP completed and unrevealing.   #Volume - appearing euvolemic now, and wt's are rising now that CRRT is off.   # Shock- off pressors. On midodrine  10 tid now.   # Multiple myeloma: Oncologist started Velcade  and Cytoxan , per oncology team.   On hold for now given ongoing critical illness.  #AHRF: s/p trach 5/8, vent weaning  # Atrial fib-   on amio.  Current in NSR  #Anemia - prn transfusions. On epogen  per onc.   # Hypophosphatemia: remains low. IV Naphos prn   Larry Poag  MD  [facility] 07/26/2023, 1:31 PM  Recent Labs  Lab 07/24/23 0421 07/24/23 1630 07/25/23 1508 07/26/23 0423  HGB 8.9*  --   --  8.9*  ALBUMIN  1.6*   < > 1.6* 1.8*  1.6*  CALCIUM  7.4*   < > 7.7* 7.7*  7.7*  PHOS 2.0*  1.5*   < > RESULTS UNAVAILABLE DUE TO INTERFERING SUBSTANCE 1.9*  1.5*  CREATININE 1.59*   < > 1.62* 1.77*  1.74*  K 4.3   < > 4.6 4.4  4.5   < > = values in this interval not displayed.    Inpatient medications:  amiodarone   200 mg Per NG tube Daily   arformoterol   15 mcg Nebulization BID   busPIRone   15 mg Per Tube QHS   clonazePAM   1 mg Per Tube BID   docusate  100 mg Per Tube BID   epoetin  alfa  40,000 Units Subcutaneous QODAY   famotidine   10.4 mg Per Tube Daily   feeding supplement (PROSource TF20)  60 mL Per Tube TID   folic acid   1 mg Per Tube Daily   gabapentin   100 mg Per Tube Q12H   heparin  injection (subcutaneous)  5,000 Units Subcutaneous Q8H   insulin   aspart  0-20 Units Subcutaneous Q4H   insulin  aspart  8 Units Subcutaneous Q4H   insulin  glargine-yfgn  10 Units Subcutaneous BID   lactulose   20 g Per Tube TID   leptospermum manuka honey  1 Application Topical Daily   lidocaine -EPINEPHrine   20 mL Intradermal Once   midodrine   10 mg Per Tube TID WC   nicotine   7 mg Transdermal Daily   mouth rinse  15 mL Mouth Rinse Q2H   PARoxetine   40 mg Per Tube Daily   pneumococcal 20-valent conjugate vaccine  0.5 mL Intramuscular Tomorrow-1000   polyethylene glycol  17 g Per Tube Daily   QUEtiapine   50 mg Per Tube QHS   revefenacin   175 mcg Nebulization Daily   sodium chloride  flush  10-40 mL Intracatheter Q12H   sodium chloride  HYPERTONIC  4 mL Nebulization Daily   thiamine   100 mg Per Tube Daily    ampicillin-sulbactam (UNASYN) IV Stopped (07/26/23 1610)   dexmedetomidine  (PRECEDEX ) IV infusion 1.2 mcg/kg/hr  (07/26/23 1300)   feeding supplement (VITAL 1.5 CAL) 60 mL/hr at 07/26/23 1300   fentaNYL  infusion INTRAVENOUS 200 mcg/hr (07/26/23 1300)   levofloxacin  (LEVAQUIN ) IV 100 mL/hr at 07/26/23 1300   midazolam      norepinephrine  (LEVOPHED ) Adult infusion 2 mcg/min (07/26/23 1300)   sodium PHOSPHATE  IVPB (in mmol)     voriconazole  Stopped (07/26/23 1225)   Followed by   [name] ON 07/27/2023] voriconazole      acetaminophen  (TYLENOL ) oral liquid 160 mg/5 mL **OR** acetaminophen , fentaNYL , ipratropium-albuterol , midazolam , ondansetron  (ZOFRAN ) IV, mouth rinse, oxyCODONE , polyethylene glycol, polyvinyl alcohol , sodium chloride  flush

## 2023-07-27 NOTE — Progress Notes (Addendum)
 Nutrition Follow-up  DOCUMENTATION CODES:   Not applicable  INTERVENTION:  - TF via NG-tube: Change to Nepro at 55 ml/h (1320 ml per day) Prosource TF20 60 ml Daily Provides 2456 kcal, 127 gm protein, 960 ml free water  daily   - FWF per CCM/MD.    - Monitor weight trends.  NUTRITION DIAGNOSIS:   Inadequate oral intake related to inability to eat as evidenced by NPO status. - Ongoing  GOAL:   Patient will meet greater than or equal to 90% of their needs - Met with TF  MONITOR:   Vent status, Labs, Weight trends, TF tolerance  REASON FOR ASSESSMENT:   Consult Assessment of nutrition requirement/status  ASSESSMENT:   58 y.o. male with PMH of obesity, asthma, OSA on CPAP, GERD, HTN, HLD who presented due to feeling weak with AMS for a few days. Admitted with concern for sepsis, community-acquired pneumonia, encephalopathy as well as concern for multiple myeloma.  4/17 Admit 4/18 Regular textured diet 4/20 Cortrak attempted but unable to be placed 4/21 SLP eval - NPO; IR attempted Cortrak but unable to be placed; CCM MD successfully placed NGT; TF started 4/22 Concern for aspiration overnight and TF stopped; Intubated; TF restarted in the evening 4/23 CRRT initiated 5/6 emesis and aspiration event, TF paused but restarted back at trickle with slow advancements back to goal 5/7 back on goal TF 5/8 trach placed 5/10 CRRT held  RD working remotely at time of assessment. CRRT has been held, plan to transfer to [facility] for iHD. RD received a consult to adjust TF to low potassium. Pt potassium level currently WNL, CRRT held yesterday morning. TF regimen adjust to lower potassium containing formula to assist in level maintenance off CRRT per nephrologies request.   Discussed with RN, pt continues to tolerate TF well, no concerns.   Patient remains on ventilator support via trach.  MV: 11 L/min MAP (cuff): 104 Temp (24hrs), Avg:102.8 F (39.3 C), Min:98.9 F (37.2 C),  Max:103.8 F (39.9 C)   Admit weight: 243# Current weight: 231.7#   Medications reviewed and include: Colace BID, Pepcid , Folic Acid , NovoLog  SSI q4h + 8 unit q4h, Semglee  BID, Lactulose  TID, Miralax , 100mg  thiamine   Drips Fentanyl   Labs reviewed: Sodium 136, Potassium 5.1, BUN 57, Creatinine 2.46, Phosphorus 1.9,  CBG: 126-177 mg/dL x 24 hours  UOP: 1 unmeasured occurrence x 24 hrs Stool output: 160 mL x 24 hrs Net UF: 1058 mL x 24 hrs I/Os: +8.9L since admit  Diet Order:   Diet Order             Diet NPO time specified  Diet effective midnight                   EDUCATION NEEDS:  Not appropriate for education at this time  Skin:  Skin Assessment: Skin Integrity Issues: Skin Integrity Issues:: DTI DTI: Left Buttocks Unstageable: Left and Right Heel  Last BM:  5/10 - Type 7 via FMS  Height:  Ht Readings from Last 1 Encounters:  07/22/23 6' 0.01" (1.829 m)   Weight:  Wt Readings from Last 1 Encounters:  07/27/23 105.1 kg   Ideal Body Weight:  86.36 kg  BMI:  Body mass index is 31.42 kg/m.  Estimated Nutritional Needs:  Kcal:  2350-2550 kcals Protein:  145-175 grams Fluid:  >/= 2.3L   Doneta Furbish RD, LDN Clinical Dietitian

## 2023-07-27 NOTE — Progress Notes (Addendum)
 NAME:  Frank [name]., MRN:  [id number], DOB:  09-Aug-1965, LOS: 24 ADMISSION DATE:  07/03/2023, CONSULTATION DATE:  07/06/23 REFERRING MD:  Dr Maury Space, CHIEF COMPLAINT:  Encephalopathy   History of Present Illness:   PCCM asked to see patient for encephalopathy   Transferred from [facility] with 1 week of generalized weakness, altered mentation, decreased appetite Recently being worked up for multiple myeloma.  Transferred to [facility] for oncology evaluation Nephrology consulted for AKI   Background history of obesity, asthma, obstructive sleep apnea on CPAP GERD, hypertension, hyperlipidemia Has been feeling weak for the last few days according to spouse Admitted with concern for sepsis, community-acquired pneumonia, antibiotic encephalopathy Workup revealed lytic bony lesions with concern for multiple myeloma Transfused with 3 units packed red blood cells Evaluation so far-bone marrow 07/04/2023  Pertinent  Medical History   Past Medical History:  Diagnosis Date   Anxiety    Arthritis    Asthma    Bipolar disorder (HCC)    Current every day smoker    Depression    Diabetes mellitus, type II (HCC)    Dyspnea    History of kidney stones    History of pneumonia    Hyperlipidemia    Hypertension    Morbid obesity (HCC)    Sedentary lifestyle    Seizures (HCC)    last seizure 12 yrs ago, no current problem   Sleep apnea    uses CPAP nightly   Significant Hospital Events: Including procedures, antibiotic start and stop dates in addition to other pertinent events   4/19-blood cultures, MRSA PCR negative 4/19 chest x-ray with mild pulmonary congestion 4/20 atrial fibrillation with RVR 4/21 nosebleed after IR NG attempt, NG placed by PCCM in afternoon, stable transferred to [facility] 4/22 intubated; some abla requiring prbcs; ct abd/pelvis showing acute groin hematoma tracking from posterior presumed from site of bone marrow biopsy; heparin  held EEG 4/25 >> moderate to severe  encephalopathy without any seizures or epileptiform discharges 4/23 respiratory culture >> MRSE 4/28 remains intubated on CRRT, down to one pressor  4/29 labile BP, NE back up. Responded a bit to fluids. Received Velcade  and Cytoxan  chemotherapy 5/1 remains encephalopathic, check CTH 5/2 LP ETT exchange  5/5 1 PRBC. Palliative care met w family  5/6 IVIG. plan for trach established. Added midodrine  after failed vaso wean. Abx/fungal changed to levaquin   5/7 weaning pressors incr midodrine  incr lactulose . Vaso off  5/8 Trach placed. Some post op bleeding improved with thrombi-pad 5/10 voriconazole  added back based on CT Chest findings, MRI brain with no acute changes  Interim History / Subjective:   Febraile to 103.25F On precedex  and fentanyl  drips, a bit more awake today but not following commands   Objective   Blood pressure (!) 160/90, pulse (!) 122, temperature (!) 103.4 F (39.7 C), temperature source Axillary, resp. rate 19, height 6' 0.01" (1.829 m), weight 105.1 kg, SpO2 97%.    Vent Mode: PRVC FiO2 (%):  [30 %-100 %] 30 % Set Rate:  [20 bmp] 20 bmp Vt Set:  [540 mL] 540 mL PEEP:  [5 cmH20] 5 cmH20 Plateau Pressure:  [6 cmH20-15 cmH20] 6 cmH20   Intake/Output Summary (Last 24 hours) at 07/27/2023 0830 Last data filed at 07/27/2023 0600 Gross per 24 hour  Intake 3097.08 ml  Output 1118 ml  Net 1979.08 ml   Filed Weights   07/25/23 0434 07/26/23 0500 07/27/23 0700  Weight: 97.7 kg 100.6 kg 105.1 kg  Physical Exam: General: Critically ill appearing middle aged male HEENT: [place]/AT, no JVD. Trach in place Neuro: PERRL, spontaneous extremity movement CV: tachycardic, no MRG PULM:  Coarse bilaterally. Thick secretions. GI: Soft, NT, ND Extremities:  trace edema  __________________________________________________________  Micro: 4/28 BCx > no growth, final 4/30 respiratory culture-few PMN, rare budding yeast, abundant GPC> few aspergillus fumigatus Quantiferon  5/2 > neg Trach asp 5/4> rare GPC, rare mold  PCP smear trach asp 5/5 > neg  Trach asp 5/9 > abundant staph hemolyticus, few budding yeast  CSF studies 1 RBC, 1 WBC, glucose 106 CSF culture-2 organisms acute, rare WBC> NGTD Cryptococcal antigen negative meningitis and encephalitis panel negative VDRL  non-reactive   AFB CSF 5/2 >>>> Resolved Problems  Shock   Assessment & Plan:   Acute encephalopathy Hx seizure disorder Hyperammonemia P -TID lactulose  - MRI brain 5/10 unremarkable for acute pathology - weaning IV sedation/analgesia -delirium precautions  -Home paxil  and gabapentin  continue -holding home VPA w ammonia issues   Acute respiratory failure w hypoxia Aspergillus Fumigatus Pneumonia Staph hemolyticus Pneumonia Small bilateral pleural effusions Hx OSA Hx tobacco use  P - Trach collar as tolerated - Continue brovana  BID and yupelri  daily - PRN duonebs - hypertonic saline nebs for airway clearance - stop unasyn, add vancomycin . Continue levaquin  and voriconazole  per ID - weaning IV sedation/analgesia, currently on precedex  and fentanyl .  - Klonipin 1mg  BID and Oxycodone  5mg  q6hrs scheduled  Sepsis due to Pneumonia  P - Continue midodrine  - continue levaquin  and voriconazole  per ID - vancomycin  for staph hemolyticus started 5/11  Fevers - differential includes sepsis mediated vs drug fever on precedex  vs paraneoplastic - stop precedex  - start naprosyn  250mg  twice daily for 3 days for paraneoplastic fevers  Multiple myeloma  Neutropenia, possible neutropenic fever -- now slight leukocytosis  TLS, improved  -appreciate Oncology's management; received Velcade  and Cytotoxan 4/29,  Velcade  held 5/2. Sounds like velcade  and cytoxan  would not usually catalyze this sort of neutropenia. -got IVIG 5/6 -5/9 WBC swing from 2  > 26 s/p G-CSF P -onc following-- chemo is currently on hold w other ongoing issues  AKI on CKD 3a  Hyponatremia  Hypophosphatemia   P -CRRT stopped 5/10 -plan to transfer to [facility] for iHD needs  PAF  -now sinus  P - amio 200mg  every day per tube x 14d > 5/20 -AC was deferred w bleeding issues this admission, now that plt are improving could consider AC should he go back into fib  Anemia, multifactorial  Thrombocytopenia, improving  -ABLA 2/2 R groin hematoma after bmbx -- stable.  -critical illness -MM  -possible drug-related  P -Follow CBC daily -goal hgb> 7   DM2 w hyperglycemia -was on Bid glargine 20 u + rSSI + 8u novolog  q4 prior to emesis/aspiration 5/6 after which EN was held and gradually increased P - rSSI + EN coverage. - glargine 10u BID   GOC: See IPAL from 5/7. Wife now updating goals to include 2-3 rounds of CPR maximum should he arrest.  Best Practice (right click and "Reselect all SmartList Selections" daily)   Diet/type: tubefeeds  DVT prophylaxis prophylactic heparin   Pressure ulcer(s): identified on: 19/Apr/2025 GI prophylaxis: H2B Lines: Dialysis Catheter Foley:  na  Code Status:  full code Last date of multidisciplinary goals of care discussion d/w wife 5/7. See IPAL  Critical care time 35 minutes  Duaine German, MD [facility] Office: [phone]   See Amion for personal pager PCCM on  call pager [phone] until 7pm. Please call Elink 7p-7a. [phone]

## 2023-07-27 NOTE — Progress Notes (Signed)
 [facility]  Date of Admission:  07/03/2023       Lines: Ett 5/2-c Right internal jugular Hd cath 4/23-c  Tracheostomy 5/8-c  Abx: 4/17 Azith >> 4/20  4/17 CTX >> 4/22 4/18 Fluconazole  > 4/23 4/22 Zosyn  >> 5/1 4/28: Vanco>>5/05 5/1 Meropenem  >> 5/6 5/4 Voriconazole  >> 5/6 5/10- 5/10 IV Unasyn  5/11 Vancomycin    5/6 levoflox >>  Other: Neupogen  dc'ed 5/7 Dexamethasone  4/18-20 Methyl pred medrol  dose pack 3/20 for 6 days   ASSESSMENT/recs 58 yo male with left orif 2024/2024, hx hip and knee arthroplasty, tobacco use/copd, marijuanna use, ckd3, aggressive MM recent dx 06/2023, admitted 4/22 in transfer from [place] for 1 week generalized weakness found to have aki, acute on chronic anemia, confusion. Course complicated by hypoxemic respiratory failure, sepsis, pna    Micro: 5/9 fungitell repeat in process 5/9 respiratory culture GPC, budding yeast, cx staph hemolyticus , mold  5/5 mrsa nares pcr negative 5/5 trach aspirate for pjp smear negative 5/5 bcx negative 5/4 fungitel positive 5/4 resp cx few gnr, few yeast, rare gpc on stain only; rare mold 5/2 csf pcr negative; csf culture negative 5/2 quantiferon gold negative 4/30 resp cx rare aspergillus fumigatus 4/28 bcx ngtd 4/23 tracheal aspirate mrse 4/19 bcx ngtd 4/19 ucx negative 4/19 mrsa nares negative   #neutropenic fever  #hypox resp failure #pna #shock #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 likely colonizer.  Has been on prolonged iv abx piptazo --> vanc/meropenem ; respiratory cx shows normal flora. Abx hasn't done much for fever curve 4/21 tte normal ef 5/5 patient aspirated; trach aspirate cx obtained negative Pjp stain  5/6 discussed with pulm team again. Tumor lysis at onset but had quickly resolved. Crrt borderline temperature. Fio2 requirement has been really low and decreased to 30% today from 50% on 5/5. Aspirated  last night but otherwise stable mild white sputum out of ett. Main issue is the cns encephalopathy. LP previously no cytology obtained (negative cx/pcr). Team has discussed plex with dr Bolling Bushy  5/6 team added midodrine  able to wean down pressors. Remains 99's temperature for most part on crrt  5/7 ams/?critical illness myopathy limiting sbt/extubation; tracheostomy planned 5/7 am cortisol 14.3; wbc normalized with daily neupogen  since 4/30 5/8 trached; cxr ?increased opacity ?fluid vs infectious 5/9 ongoing fever however pressors requirement down to 1 mcg/min; fio2 requirement remains 30%   Working dx/loose end remains same -- Legionella if present hasn't been fully treated. Changed all antimicrobial to levofloxacin  planned 7 days, starting 5/6 Fungitell elevated but nonspecific. Low suspicion for fungal infection -- aspergillus a colonizer; pjp smear negative.  Has been on neupogen  until 5/7, so increased recent wbc due to that; especially improved pressors requirement and stable fio2 ?fever from MM  #ams 5/2 lp normal glucose, protein, and only 1 wbc. No cytology/flow sent Suspect metabolic. 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 Ammonia level had normalized by 07/17/23 5/7 remains intubated on sedation/analgesia.  5/9 slight improvement following some simple commands and eyes opening (on dexmedetomidate/trached 5/8)  #aki on ckd Shock, MM related and ace-I at home Crrt started here 5/6 remains on stable low dose pressors in setting crrt  #mm #tumor lysis #hypercalcemia Aggressive cytogenetics on bone marrow pathology Chemo 4/22 and 4/25 -- high uric acid within 24 hours chemo but quickly decreased/normalized Rasburicase  preemptive prophy previouslys tarted Onc following  5/11 A/P Voriconazole  started  yesterday after worsening findings in Chest CT with nodules as well as Unasyn added by Dr Shereen Dike, plan to finish levofloxacin  course. MRI  brain with no acute abnormality   T max 103.6 today, WBC down to 26.2, CR up to 2.46. Remains off pressors. Vent requirement same at 30% Fio2. Spoke with RN, says no changes in mental status, hard to tell if tracks. No rashes, diarrhea, signs of septic joint, CVC looks OK, stage 2 DTI +. Vancomycin  added today per PCCM to cover staph hemolyticus   Differential for fevers- PNA with sepsis, precedex , drug fevers, paraneoplastic  Plan  - continue voriconazole , continues to consistently grow in resp cultures and likely causing PNA - continue levofloxacin  course as planned  - Unlikely staph hemolyticus is pathogenic here. Will get repeat blood cx 2 sets today. Plan to DC Vancomycin  if negative blood cultures - Naproxen  trial for possible paraneoplastic fevers - Monitor fevers, leukocytosis  Dr Zelda Hickman starting from 07/28/23  Principal Problem:   Generalized weakness Active Problems:   Multiple myeloma not having achieved remission (HCC)   Atrial fibrillation with rapid ventricular response (HCC)   AKI (acute kidney injury) (HCC)   Acute respiratory failure with hypoxia (HCC)   PAF (paroxysmal atrial fibrillation) (HCC)   Acute encephalopathy   Severe sepsis with septic shock (HCC)   On mechanically assisted ventilation (HCC)   Acute metabolic encephalopathy   Lobar pneumonia, unspecified organism (HCC)   Multiple myeloma without remission (HCC)   Allergies  Allergen Reactions   Morphine And Codeine Anaphylaxis   Shellfish Allergy Anaphylaxis   Betadine  [Povidone Iodine ] Itching   Bupropion Other (See Comments)    Shaking of the body, hallucinations    Chlorhexidine  Hives    Patient reports never had issues CHG with mouth rinse.   Influenza Vaccines Hives   Metoprolol  Rash    Scheduled Meds:  amiodarone   200 mg Per NG tube Daily   arformoterol   15 mcg Nebulization BID   busPIRone   15 mg Per Tube QHS   clonazePAM   1 mg Per Tube BID   docusate  100 mg Per Tube BID   epoetin  alfa   40,000 Units Subcutaneous QODAY   famotidine   10.4 mg Per Tube Daily   [START ON 07/28/2023] feeding supplement (PROSource TF20)  60 mL Per Tube Daily   folic acid   1 mg Per Tube Daily   gabapentin   100 mg Per Tube Q12H   heparin  injection (subcutaneous)  5,000 Units Subcutaneous Q8H   insulin  aspart  0-20 Units Subcutaneous Q4H   insulin  aspart  8 Units Subcutaneous Q4H   insulin  glargine-yfgn  10 Units Subcutaneous BID   lactulose   20 g Per Tube TID   leptospermum manuka honey  1 Application Topical Daily   lidocaine -EPINEPHrine   20 mL Intradermal Once   midodrine   10 mg Per Tube TID WC   naproxen   250 mg Per Tube BID WC   nicotine   7 mg Transdermal Daily   mouth rinse  15 mL Mouth Rinse Q2H   oxyCODONE   5 mg Per Tube Q6H   PARoxetine   40 mg Per Tube Daily   pneumococcal 20-valent conjugate vaccine  0.5 mL Intramuscular Tomorrow-1000   polyethylene glycol  17 g Per Tube Daily   QUEtiapine   50 mg Per Tube QHS   revefenacin   175 mcg Nebulization Daily   sodium chloride  flush  10-40 mL Intracatheter Q12H   sodium chloride  HYPERTONIC  4 mL Nebulization Daily   thiamine   100  mg Per Tube Daily   Continuous Infusions:  sodium chloride      feeding supplement (NEPRO CARB STEADY)     feeding supplement (VITAL 1.5 CAL) 60 mL/hr at 07/27/23 1530   fentaNYL  infusion INTRAVENOUS 200 mcg/hr (07/27/23 1530)   levofloxacin  (LEVAQUIN ) IV Stopped (07/27/23 1413)   norepinephrine  (LEVOPHED ) Adult infusion Stopped (07/27/23 0247)   voriconazole  Stopped (07/27/23 1340)   PRN Meds:.sodium chloride , acetaminophen  (TYLENOL ) oral liquid 160 mg/5 mL **OR** acetaminophen , fentaNYL , heparin , ipratropium-albuterol , ondansetron  (ZOFRAN ) IV, mouth rinse, polyethylene glycol, polyvinyl alcohol , sodium chloride  flush   SUBJECTIVE: Febrile On Fio2 30%   Review of Systems: ROS unable as critically ill   OBJECTIVE: Vitals:   07/27/23 1400 07/27/23 1430 07/27/23 1500 07/27/23 1530  BP: 134/87 (!) 142/72  (!) 154/89 (!) 157/97  Pulse: (!) 114 (!) 115 (!) 115 (!) 115  Resp: (!) 24 19 (!) 21 (!) 21  Temp: (!) 102.4 F (39.1 C) (!) 102.6 F (39.2 C) (!) 102.7 F (39.3 C) (!) 102.7 F (39.3 C)  TempSrc:   Core   SpO2: 97% 97% 97% 97%  Weight:      Height:       Body mass index is 31.42 kg/m.  Physical Exam   General - opens eyes but does not track or follows commands On precedex  and fentanyl   On IHD  HEENT wnl, midline tracheostomy  Chest - coarse breath sounds CVS - RRR Abdomen soft and non distended  Extremities - diffusely edematous, no signs of septic joint  Skin - no rashes, Rt HDC with no signs of infection    Lab Results Lab Results  Component Value Date   WBC 26.2 (H) 07/27/2023   HGB 8.6 (L) 07/27/2023   HCT 29.5 (L) 07/27/2023   MCV 103.1 (H) 07/27/2023   PLT 244 07/27/2023    Lab Results  Component Value Date   CREATININE 2.46 (H) 07/27/2023   BUN 57 (H) 07/27/2023   NA 136 07/27/2023   K 5.1 07/27/2023   CL 105 07/27/2023   CO2 24 07/27/2023    Lab Results  Component Value Date   ALT 10 07/27/2023   AST 12 (L) 07/27/2023   ALKPHOS 119 07/27/2023   BILITOT 0.6 07/27/2023      Microbiology: Recent Results (from the past 240 hours)  CSF culture w Gram Stain     Status: None   Collection Time: 07/18/23  1:32 PM   Specimen: PATH Cytology CSF; Cerebrospinal Fluid  Result Value Ref Range Status   Specimen Description   Final    CSF Performed at [facility], [address], [place], [place] [address]    Special Requests   Final    Immunocompromised Performed at [facility], [address], [place], [place] [address]    Gram Stain   Final    NO ORGANISMS SEEN RARE WBC PRESENT, PREDOMINANTLY PMN Gram Stain Report Called to,Read Back By and Verified With: RN [employer] AT 1443 07/18/23 CRUICKSHANK A GRAM STAIN REVIEWED-AGREE WITH RESULT DRT    Culture   Final    NO GROWTH 3 DAYS Performed at [facility], [address], [place], [place] [address]    Report Status 07/22/2023 FINAL  Final  Culture, Respiratory w Gram Stain     Status: None (Preliminary result)   Collection Time: 07/20/23 11:38 AM   Specimen: Tracheal Aspirate; Respiratory  Result Value Ref Range Status   Specimen Description   Final  TRACHEAL ASPIRATE Performed at [facility], [address], [place], [place] [address]    Special Requests   Final    NONE Performed at [facility], [address] [place], [place] [address]    Gram Stain   Final    ABUNDANT WBC PRESENT, PREDOMINANTLY PMN FEW SQUAMOUS EPITHELIAL CELLS PRESENT FEW GRAM NEGATIVE RODS FEW YEAST RARE GRAM POSITIVE COCCI    Culture   Final    RARE MOLD ISOLATE REFERRED FOR IDENTIFICATION [facility] [place] Performed at [facility], [address], [place], [place] [address]    Report Status PENDING  Incomplete  Culture, blood (Routine X 2) w Reflex to ID Panel     Status: None   Collection Time: 07/21/23  1:27 AM   Specimen: BLOOD  Result Value Ref Range Status   Specimen Description   Final    BLOOD BLOOD LEFT HAND Performed at [facility], [address], [place], [place] [address]    Special Requests   Final    BOTTLES DRAWN AEROBIC ONLY Blood Culture results may not be optimal due to an inadequate volume of blood received in culture bottles Performed at [facility], [address] [place], [place] [address]    Culture   Final    NO GROWTH 5 DAYS Performed at [facility], [address] [place], [place] [address]    Report Status 07/26/2023 FINAL  Final  Culture, blood (Routine X 2) w Reflex to ID Panel     Status: None   Collection Time: 07/21/23  1:27 AM   Specimen: BLOOD  Result Value Ref Range Status   Specimen Description   Final    BLOOD BLOOD LEFT HAND Performed at [facility], [address],  [place], [place] [address]    Special Requests   Final    BOTTLES DRAWN AEROBIC ONLY Blood Culture results may not be optimal due to an inadequate volume of blood received in culture bottles Performed at [facility], [address] [place], [place] [address]    Culture   Final    NO GROWTH 5 DAYS Performed at [facility], [address] [place], [place] [address]    Report Status 07/26/2023 FINAL  Final  MRSA Next Gen by PCR, Nasal     Status: None   Collection Time: 07/21/23  8:08 AM   Specimen: Nasal Mucosa; Nasal Swab  Result Value Ref Range Status   MRSA by PCR Next Gen NOT DETECTED NOT DETECTED Final    Comment: (NOTE) The GeneXpert MRSA Assay (FDA approved for NASAL specimens only), is one component of a comprehensive MRSA colonization surveillance program. It is not intended to diagnose MRSA infection nor to guide or monitor treatment for MRSA infections. Test performance is not FDA approved in patients less than 43 years old. Performed at [facility], [address], [place], [place] [address]   Pneumocystis smear by DFA     Status: None   Collection Time: 07/21/23  3:41 PM   Specimen: Tracheal Aspirate; Respiratory  Result Value Ref Range Status   Specimen Source-PJSRC TRACHEAL ASPIRATE  Final   Pneumocystis jiroveci Ag See Scanned report in [facility]  Final    Comment: Performed at [facility], [address], [place], [place] [address]  Culture, Respiratory w Gram Stain     Status: None (Preliminary result)  Collection Time: 07/25/23  9:55 AM   Specimen: Tracheal Aspirate; Respiratory  Result Value Ref Range Status   Specimen Description   Final    TRACHEAL ASPIRATE Performed at [facility], [address], [place], [place] [address]    Special Requests   Final    NONE Performed at [facility], [address] [place], [place] [address]    Gram Stain    Final    NO WBC SEEN ABUNDANT GRAM POSITIVE COCCI FEW BUDDING YEAST SEEN    Culture   Final    ABUNDANT STAPHYLOCOCCUS HAEMOLYTICUS SUSCEPTIBILITIES TO FOLLOW MOLD ATTEMPTING TO ISOLATE FOR SENDOUT Performed at [facility], [address], [place], [place] [address]    Report Status PENDING  Incomplete     Serology:   Imaging: If present, new imagings (plain films, ct scans, and mri) have been personally visualized and interpreted; radiology reports have been reviewed. Decision making incorporated into the Impression / Recommendations.  MR BRAIN WO CONTRAST Result Date: 07/26/2023 CLINICAL DATA:  Follow-up examination for aspergillosis, altered mental status, immunosuppressed. EXAM: MRI HEAD WITHOUT CONTRAST TECHNIQUE: Multiplanar, multiecho pulse sequences of the brain and surrounding structures were obtained without intravenous contrast. COMPARISON:  MRI from 07/18/2023. FINDINGS: Brain: Cerebral volume within normal limits for age. Patchy T2/FLAIR hyperintensity involving the supratentorial cerebral white matter, most like related chronic microvascular ischemic disease, stable. No evidence for acute or subacute infarct. No areas of chronic cortical infarction or other insult. No acute intracranial hemorrhage. Single punctate chronic microhemorrhage noted at the left frontal corona radiata, stable. No mass lesion, midline shift or mass effect. Ventricles stable in size without hydrocephalus. No extra-axial fluid collection. Pituitary gland and suprasellar region within normal limits. No evidence for acute CNS infection by MRI. Vascular: Major intracranial vascular flow voids are stable and maintained. Skull and upper cervical spine: Craniocervical junction within normal limits. Diffusely abnormal appearance of the visualized osseous structures, consistent with Mets or myeloma, relatively stable from prior. No scalp soft tissue abnormality. Sinuses/Orbits: Globes and orbital soft  tissues within normal limits. Scattered mucosal thickening present about the sphenoid and maxillary sinuses. Large right with moderate left mastoid effusions. Other: Incidental cystic lesion at the nasal bridge again noted, likely a dermoid/epidermoid, stable. IMPRESSION: 1. Stable brain MRI. No acute intracranial abnormality. 2. Diffusely abnormal appearance of the visualized osseous structures, concerning for metastatic disease or myeloma, stable. 3. Moderate cerebral white matter disease, nonspecific, but most commonly related to chronic microvascular ischemic disease. 4. Large right with moderate left mastoid effusions, of uncertain significance. Correlation with physical exam recommended. Electronically Signed   By: Virgia Griffins M.D.   On: 07/26/2023 18:25   DG Chest Port 1 View Result Date: 07/26/2023 CLINICAL DATA:  [id number] Increased tracheal secretions [id number] EXAM: PORTABLE CHEST 1 VIEW COMPARISON:  Jul 24, 2023 FINDINGS: The cardiomediastinal silhouette is unchanged in contour.Tracheostomy. The enteric tube courses through the chest to the abdomen with weighted metallic tip projecting over the stomach. RIGHT IJ CVC catheter tip terminates over the SVC. No pleural effusion. No pneumothorax. Perihilar vascular fullness. There is bronchial wall cuffing. Biapical airspace opacities and LEFT retrocardiac airspace opacity. Advanced degenerative changes of the shoulders. IMPRESSION: 1. Bronchial wall thickening with patchy bilateral airspace opacities likely reflect multifocal infection. Differential considerations include pulmonary edema. Electronically Signed   By: Clancy Crimes M.D.   On: 07/26/2023 10:31   CT CHEST WO CONTRAST Result Date: 07/26/2023 CLINICAL DATA:  Hypoxic respiratory failure.  Evaluate for changes suggestive of invasive fungal infection. EXAM: CT CHEST WITHOUT CONTRAST TECHNIQUE: Multidetector CT imaging of the chest was performed following the standard protocol without IV  contrast. RADIATION DOSE REDUCTION: This exam was performed according to the departmental dose-optimization program which includes automated exposure control, adjustment of the mA and/or kV according to patient size and/or use of iterative reconstruction technique. COMPARISON:  07/20/2023 FINDINGS: Cardiovascular: Heart size is normal. There is no pericardial effusion. Aortic atherosclerosis and coronary artery calcifications. Mediastinum/Nodes: Tracheostomy tube tip is above the carina. Enteric tube tip is in the body of the stomach. No mediastinal or hilar adenopathy. Lungs/Pleura: Decreased volume of bilateral pleural effusions. Partial atelectasis of bilateral lower lobes identified. Progressive ground-glass and airspace densities within the left upper lobe noted. There also multiple nodular densities with surrounding ground-glass attenuation throughout both upper lung zones, also increased from the previous exam. Upper Abdomen: No acute abnormality. Nonobstructing stone within the upper pole of the right kidney, image 154/2. Musculoskeletal: Multiple lucent bone lesions are identified throughout the visualized bony thorax. These appears similar in distribution to the previous exam. Unchanged mild compression deformity involving the superior endplate of the T11 vertebra. IMPRESSION: 1. Decreased volume of bilateral pleural effusions. 2. Progressive ground-glass and airspace densities within the left upper lobe. There also multiple nodular densities with surrounding ground-glass attenuation throughout both upper lung zones, also increased from the previous exam. Findings are favored to represent multifocal pneumonia. Nodular densities with surrounding ground-glass attenuation may be seen in the setting of fungal infection particularly and anion 0 compromised in visual. 3. Multiple lucent bone lesions are identified throughout the visualized bony thorax. These appears similar in distribution to the previous exam.  Unchanged mild compression deformity involving the superior endplate of the T11 vertebra. Imaging findings are concerning for metastatic disease versus multiple myeloma. 4. Nonobstructing right renal stone. 5. Coronary artery calcifications. 6.  Aortic Atherosclerosis (ICD10-I70.0). Electronically Signed   By: Kimberley Penman M.D.   On: 07/26/2023 04:56      5/8 cxr compared to 5/6 FINDINGS: Tracheostomy with tip approximately 5.5 cm above the carina. Enteric tube extends below the diaphragm with tip beyond the inferior margin of the image. Right IJ central venous line in similar position. Similar appearance of bilateral pulmonary opacities. No pleural effusion or pneumothorax. The cardiac silhouette. No acute osseous pathology.   IMPRESSION: 1. Tracheostomy with tip approximately 5.5 cm above the carina. 2. Similar appearance of bilateral pulmonary opacities.   5/6 cxr FINDINGS: Stable cardiomediastinal silhouette. Tracheostomy and feeding tubes are unchanged. Right internal jugular catheter is unchanged. Increased bilateral lung opacities are noted diffusely consistent with edema or atypical inflammation. Severe degenerative changes are seen involving both glenohumeral joints.   IMPRESSION: Stable support apparatus. Increased bilateral lung opacities concerning for edema or pneumonia.    5/2 dvt duplex bilateral lower ext, negative  5/4 chest ct noncontrast 1. Bilateral lower lobe airspace consolidation compatible with pneumonia. 2. Multifocal patchy airspace areas and ill-defined ground-glass slightly nodular opacities throughout the bilateral upper lobes and minimally in the right middle lobe, also likely infectious/inflammatory. 3. Small bilateral pleural effusions. 4. Mild cardiomegaly. 5. Cholelithiasis. 6. Scattered lucent lesions throughout the thoracic spine measuring up to 8 mm, nonspecific. Findings may be related to multiple myeloma or metastatic  disease. 7. Minimal compression deformity of the superior endplate of T11.  5/2 mri brain No acute intracranial abnormality.   Nonspecific white matter signal abnormality likely reflecting moderate chronic microvascular ischemic changes.   Multiple  calvarial lesions corresponding to lytic lesions on CT. Findings concerning for multiple myeloma versus metastasis.   1.5 cm cystic lesion in the subcutaneous tissues overlying the nasal bones likely reflecting epidermoid cyst versus dermoid. No evidence of connection to the intracranial compartment.   Large right mastoid effusion.    4/21 tte  1. Left ventricular ejection fraction, by estimation, is 65 to 70%. The  left ventricle has normal function. The left ventricle has no regional  wall motion abnormalities. Left ventricular diastolic parameters are  indeterminate.   2. Right ventricular systolic function is normal. The right ventricular  size is normal. There is normal pulmonary artery systolic pressure. The  estimated right ventricular systolic pressure is 35.8 mmHg.   3. Left atrial size was moderately dilated.   4. The mitral valve is normal in structure. No evidence of mitral valve  regurgitation. No evidence of mitral stenosis.   5. The aortic valve is tricuspid. Aortic valve regurgitation is not  visualized. Aortic valve sclerosis/calcification is present, without any  evidence of aortic stenosis. Aortic valve mean gradient measures 8.0 mmHg.   6. Aortic dilatation noted. There is mild dilatation of the aortic root,  measuring 39 mm.   7. The inferior vena cava is dilated in size with <50% respiratory  variability, suggesting right atrial pressure of 15 mmHg.   8. The patient was in atrial fibrillation.    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   Melvina Stage, MD [facility] [phone] pager    07/27/2023, 3:55 PM

## 2023-07-27 NOTE — Plan of Care (Signed)
  Problem: Education: Goal: Knowledge of General Education information will improve Description: Including pain rating scale, medication(s)/side effects and non-pharmacologic comfort measures Outcome: Progressing   Problem: Health Behavior/Discharge Planning: Goal: Ability to manage health-related needs will improve Outcome: Progressing   Problem: Clinical Measurements: Goal: Ability to maintain clinical measurements within normal limits will improve Outcome: Progressing Goal: Will remain free from infection Outcome: Progressing Goal: Diagnostic test results will improve Outcome: Progressing Goal: Respiratory complications will improve Outcome: Progressing Goal: Cardiovascular complication will be avoided Outcome: Progressing   Problem: Activity: Goal: Risk for activity intolerance will decrease Outcome: Progressing   Problem: Nutrition: Goal: Adequate nutrition will be maintained Outcome: Progressing   Problem: Coping: Goal: Level of anxiety will decrease Outcome: Progressing   Problem: Elimination: Goal: Will not experience complications related to bowel motility Outcome: Progressing Goal: Will not experience complications related to urinary retention Outcome: Progressing   Problem: Pain Managment: Goal: General experience of comfort will improve and/or be controlled Outcome: Progressing   Problem: Safety: Goal: Ability to remain free from injury will improve Outcome: Progressing   Problem: Skin Integrity: Goal: Risk for impaired skin integrity will decrease Outcome: Progressing   Problem: Metabolic: Goal: Ability to maintain appropriate glucose levels will improve Outcome: Progressing   Problem: Nutritional: Goal: Maintenance of adequate nutrition will improve Outcome: Progressing Goal: Progress toward achieving an optimal weight will improve Outcome: Progressing   Problem: Skin Integrity: Goal: Risk for impaired skin integrity will decrease Outcome:  Progressing   Problem: Tissue Perfusion: Goal: Adequacy of tissue perfusion will improve Outcome: Progressing   Problem: Activity: Goal: Ability to tolerate increased activity will improve Outcome: Progressing   Problem: Respiratory: Goal: Ability to maintain a clear airway and adequate ventilation will improve Outcome: Progressing   Problem: Role Relationship: Goal: Method of communication will improve Outcome: Progressing   Problem: Respiratory: Goal: Patent airway maintenance will improve Outcome: Progressing   Problem: Education: Goal: Ability to describe self-care measures that may prevent or decrease complications (Diabetes Survival Skills Education) will improve Outcome: Not Progressing   Cindy S. Bernetta Brilliant BSN, RN, CCRP, CCRN 07/27/2023 5:51 AM

## 2023-07-27 NOTE — Plan of Care (Signed)
  Problem: Clinical Measurements: Goal: Respiratory complications will improve Outcome: Progressing Goal: Cardiovascular complication will be avoided Outcome: Progressing   Problem: Activity: Goal: Risk for activity intolerance will decrease Outcome: Progressing   Problem: Nutrition: Goal: Adequate nutrition will be maintained Outcome: Progressing   Problem: Elimination: Goal: Will not experience complications related to bowel motility Outcome: Progressing Goal: Will not experience complications related to urinary retention Outcome: Progressing   

## 2023-07-27 NOTE — Progress Notes (Addendum)
 Pharmacy Note regarding consult request:  Change NG tube feeds and supplements over to low K+, he is anuric and on iHD now (off CRRT) and K+ is creeping up per Nephrology  Assessment: Progress note by registered dietician (RD) placed this afternoon including comment noting request to adjust TF to low potassium.  Plan: Defer to RD and follow RD's assessment/note/plan as outlined today Pharmacy will sign off considering RD on board   Thank you for allowing pharmacy to be a part of this patient's care.  Alfredo Inch, PharmD, BCPS Clinical Pharmacist [place] 07/27/2023 1:55 PM

## 2023-07-27 NOTE — Progress Notes (Signed)
 Pharmacy Antibiotic Note  Frank [name]. is a 58 y.o. male admitted on 07/03/2023 with generalized weakness (multifactorial secondary to hypercalcemia, suspected malignancy, multiple myeloma).  On antibiotics since admission.  Was on voriconazole  5/4-5/6.  Currently on levofloxacin  for HCAP and Unasyn for aspiration pneumonia.  On 5/10, Pharmacy has been consulted to add back voriconazole  dosing per ID for IFI in the context of persistent fever, chest CT increasing nodular opacity, and sputum culture with aspergillus.  Today, Unasyn discontinued and Pharmacy consulted for vancomycin  dosing for CoNS pneumonia.    CRRT stopped 5/10 with plans to transfer to [facility] for hemodialysis (HD).  Plan: Vancomycin  2000 mg IV x 1 loading dose, will defer further dosing pending trend in renal function and HD schedule.  F/U plans for HD & renal function with morning labs and determine need for scheduled dose vs checking level. Continue Levaquin  500 mg IV q24h with anticipated HD tomorrow or Tuesday Continue Voriconazole  400 mg IV twice daily F/U HD schedule/plans Monitor clinical progress, electrolytes, renal function, hepatic function, serum trough voriconazole  concentrations and vancomycin  levels if indicated.     Height: 6' 0.01" (182.9 cm) Weight: 105.1 kg (231 lb 11.3 oz) IBW/kg (Calculated) : 77.62  Temp (24hrs), Avg:100.5 F (38.1 C), Min:98.9 F (37.2 C), Max:103.4 F (39.7 C)  Recent Labs  Lab 07/22/23 0457 07/22/23 1641 07/23/23 0416 07/23/23 1722 07/24/23 0421 07/24/23 1630 07/25/23 0409 07/25/23 1508 07/26/23 0423 07/27/23 0313  WBC 2.3*  --  12.6*  --  26.1*  --   --   --  29.2* 26.2*  CREATININE  --    < > 1.62*   < > 1.59* 1.53* 1.52* 1.62* 1.77*  1.74* 2.46*   < > = values in this interval not displayed.    Estimated Creatinine Clearance: 40.5 mL/min (A) (by C-G formula based on SCr of 2.46 mg/dL (H)).    Allergies  Allergen Reactions   Morphine And Codeine Anaphylaxis    Shellfish Allergy Anaphylaxis   Betadine  [Povidone Iodine ] Itching   Bupropion Other (See Comments)    Shaking of the body, hallucinations    Chlorhexidine  Hives    Patient reports never had issues CHG with mouth rinse.   Influenza Vaccines Hives   Metoprolol  Rash    Antimicrobials this admission: 4/17 Azith >> 4/20  4/17 CTX >> 4/22 4/18 Fluconazole  > 4/23 4/22 Zosyn  >> 5/1 4/28: Vanco>>5/05 5/1 Meropenem  >> 5/6 5/10 Unasyn >> 5/11 5/4 Voriconazole  >> 5/6,  added back 5/10 >> 5/6 levoflox >> 5/11 vancomycin  >>  Microbiology results: 4/19: MRSA PCR: neg 4/19 UCx: ngF 4/19 BCx: ngF 4/23 TA: few Staph epi: MRSE 4/28 BCx: ngF 4/30 TA: rare aspergillus fumigatus 5/2 CSF: ngtd 5/4 tracheal asp: rare mold 5/5 TA PJP stain: neg 5/5 MRSA PCR: neg 5/5 BCx: NGTD 5/9 tracheal asp: abundant staph haemolyticus, report status pending  Thank you for allowing pharmacy to be a part of this patient's care.  Alfredo Inch, PharmD, BCPS Clinical Pharmacist [facility] 07/27/2023 8:50 AM

## 2023-07-28 DIAGNOSIS — D72829 Elevated white blood cell count, unspecified: Secondary | ICD-10-CM | POA: Diagnosis not present

## 2023-07-28 DIAGNOSIS — Z87891 Personal history of nicotine dependence: Secondary | ICD-10-CM | POA: Diagnosis not present

## 2023-07-28 DIAGNOSIS — R531 Weakness: Secondary | ICD-10-CM | POA: Diagnosis not present

## 2023-07-28 DIAGNOSIS — F17210 Nicotine dependence, cigarettes, uncomplicated: Secondary | ICD-10-CM

## 2023-07-28 DIAGNOSIS — B441 Other pulmonary aspergillosis: Secondary | ICD-10-CM | POA: Diagnosis not present

## 2023-07-28 DIAGNOSIS — J189 Pneumonia, unspecified organism: Secondary | ICD-10-CM

## 2023-07-28 DIAGNOSIS — J168 Pneumonia due to other specified infectious organisms: Secondary | ICD-10-CM

## 2023-07-28 DIAGNOSIS — B440 Invasive pulmonary aspergillosis: Secondary | ICD-10-CM

## 2023-07-28 DIAGNOSIS — J9601 Acute respiratory failure with hypoxia: Secondary | ICD-10-CM | POA: Diagnosis not present

## 2023-07-28 DIAGNOSIS — J90 Pleural effusion, not elsewhere classified: Secondary | ICD-10-CM | POA: Diagnosis not present

## 2023-07-28 LAB — COMPREHENSIVE METABOLIC PANEL WITH GFR
ALT: 11 U/L (ref 0–44)
AST: 11 U/L — ABNORMAL LOW (ref 15–41)
Albumin: <1.5 g/dL — ABNORMAL LOW (ref 3.5–5.0)
Alkaline Phosphatase: 95 U/L (ref 38–126)
Anion gap: 13 (ref 5–15)
BUN: 90 mg/dL — ABNORMAL HIGH (ref 6–20)
CO2: 21 mmol/L — ABNORMAL LOW (ref 22–32)
Calcium: 8 mg/dL — ABNORMAL LOW (ref 8.9–10.3)
Chloride: 105 mmol/L (ref 98–111)
Creatinine, Ser: 4.11 mg/dL — ABNORMAL HIGH (ref 0.61–1.24)
GFR, Estimated: 16 mL/min — ABNORMAL LOW (ref >60)
Glucose, Bld: 124 mg/dL — ABNORMAL HIGH (ref 70–99)
Potassium: 5.2 mmol/L — ABNORMAL HIGH (ref 3.5–5.1)
Sodium: 139 mmol/L (ref 135–145)
Total Bilirubin: 0.7 mg/dL (ref 0.0–1.2)
Total Protein: 7.9 g/dL (ref 6.5–8.1)

## 2023-07-28 LAB — CBC WITH DIFFERENTIAL/PLATELET
Abs Immature Granulocytes: 0 10*3/uL (ref 0.00–0.07)
Basophils Absolute: 0 10*3/uL (ref 0.0–0.1)
Basophils Relative: 0 %
Eosinophils Absolute: 0 10*3/uL (ref 0.0–0.5)
Eosinophils Relative: 0 %
HCT: 26.9 % — ABNORMAL LOW (ref 39.0–52.0)
Hemoglobin: 8.1 g/dL — ABNORMAL LOW (ref 13.0–17.0)
Lymphocytes Relative: 12 %
Lymphs Abs: 3.1 10*3/uL (ref 0.7–4.0)
MCH: 30.3 pg (ref 26.0–34.0)
MCHC: 30.1 g/dL (ref 30.0–36.0)
MCV: 100.7 fL — ABNORMAL HIGH (ref 80.0–100.0)
Monocytes Absolute: 0.3 10*3/uL (ref 0.1–1.0)
Monocytes Relative: 1 %
Neutro Abs: 22.5 10*3/uL — ABNORMAL HIGH (ref 1.7–7.7)
Neutrophils Relative %: 87 %
Platelets: 231 10*3/uL (ref 150–400)
RBC: 2.67 MIL/uL — ABNORMAL LOW (ref 4.22–5.81)
RDW: 20.8 % — ABNORMAL HIGH (ref 11.5–15.5)
WBC: 25.9 10*3/uL — ABNORMAL HIGH (ref 4.0–10.5)
nRBC: 4 /100{WBCs} — ABNORMAL HIGH
nRBC: 5 % — ABNORMAL HIGH (ref 0.0–0.2)

## 2023-07-28 LAB — PHOSPHORUS: Phosphorus: 6.4 mg/dL — ABNORMAL HIGH (ref 2.5–4.6)

## 2023-07-28 LAB — GLUCOSE, CAPILLARY
Glucose-Capillary: 100 mg/dL — ABNORMAL HIGH (ref 70–99)
Glucose-Capillary: 108 mg/dL — ABNORMAL HIGH (ref 70–99)
Glucose-Capillary: 117 mg/dL — ABNORMAL HIGH (ref 70–99)
Glucose-Capillary: 95 mg/dL (ref 70–99)
Glucose-Capillary: 96 mg/dL (ref 70–99)
Glucose-Capillary: 97 mg/dL (ref 70–99)

## 2023-07-28 MED ORDER — CLONAZEPAM 1 MG PO TABS
2.0000 mg | ORAL_TABLET | Freq: Two times a day (BID) | ORAL | Status: DC
  Administered 2023-07-28: 2 mg
  Filled 2023-07-28: qty 2

## 2023-07-28 MED ORDER — PENTAFLUOROPROP-TETRAFLUOROETH EX AERO: 1.0000 | INHALATION_SPRAY | CUTANEOUS | Status: DC | PRN

## 2023-07-28 MED ORDER — ALTEPLASE 2 MG IJ SOLR: 2.0000 mg | Freq: Once | INTRAMUSCULAR | Status: DC | PRN

## 2023-07-28 MED ORDER — CLONAZEPAM 1 MG PO TABS
2.0000 mg | ORAL_TABLET | Freq: Three times a day (TID) | ORAL | Status: DC
  Administered 2023-07-28 – 2023-07-31 (×9): 2 mg
  Filled 2023-07-28 (×10): qty 2

## 2023-07-28 MED ORDER — HEPARIN SODIUM (PORCINE) 1000 UNIT/ML DIALYSIS: 1000.0000 [IU] | INTRAMUSCULAR | Status: DC | PRN

## 2023-07-28 MED ORDER — LIDOCAINE HCL (PF) 1 % IJ SOLN: 5.0000 mL | INTRAMUSCULAR | Status: DC | PRN

## 2023-07-28 MED ORDER — HEPARIN SODIUM (PORCINE) 1000 UNIT/ML DIALYSIS
2000.0000 [IU] | INTRAMUSCULAR | Status: AC | PRN
  Administered 2023-07-28: 2000 [IU] via INTRAVENOUS_CENTRAL

## 2023-07-28 MED ORDER — HEPARIN SODIUM (PORCINE) 1000 UNIT/ML DIALYSIS
2500.0000 [IU] | Freq: Once | INTRAMUSCULAR | Status: AC
  Administered 2023-07-28: 2500 [IU] via INTRAVENOUS_CENTRAL

## 2023-07-28 MED ORDER — LIDOCAINE-PRILOCAINE 2.5-2.5 % EX CREA: 1.0000 | TOPICAL_CREAM | CUTANEOUS | Status: DC | PRN

## 2023-07-28 MED ORDER — HEPARIN SODIUM (PORCINE) 1000 UNIT/ML IJ SOLN
2400.0000 [IU] | Freq: Once | INTRAMUSCULAR | Status: AC
  Administered 2023-07-28: 2400 [IU]

## 2023-07-28 MED ORDER — VORICONAZOLE 200 MG IV SOLR
4.0000 mg/kg | Freq: Two times a day (BID) | INTRAVENOUS | Status: DC
  Administered 2023-07-28 – 2023-08-05 (×17): 350 mg via INTRAVENOUS
  Filled 2023-07-28 (×11): qty 350
  Filled 2023-07-28: qty 200
  Filled 2023-07-28 (×2): qty 350
  Filled 2023-07-28: qty 200
  Filled 2023-07-28 (×6): qty 350

## 2023-07-28 MED ORDER — ANTICOAGULANT SODIUM CITRATE 4% (200MG/5ML) IV SOLN: 5.0000 mL | Status: DC | PRN

## 2023-07-28 MED ORDER — HEPARIN SODIUM (PORCINE) 1000 UNIT/ML IJ SOLN
INTRAMUSCULAR | Status: AC
  Filled 2023-07-28: qty 5

## 2023-07-28 MED ORDER — NEPRO/CARBSTEADY PO LIQD: 237.0000 mL | ORAL | Status: DC | PRN

## 2023-07-28 MED ORDER — DEXMEDETOMIDINE HCL IN NACL 400 MCG/100ML IV SOLN
0.0000 ug/kg/h | INTRAVENOUS | Status: DC
  Administered 2023-07-28 – 2023-07-29 (×2): 0.4 ug/kg/h via INTRAVENOUS
  Administered 2023-07-29: 0.6 ug/kg/h via INTRAVENOUS
  Administered 2023-07-30: 0.5 ug/kg/h via INTRAVENOUS
  Filled 2023-07-28 (×4): qty 100

## 2023-07-28 MED ORDER — HEPARIN SODIUM (PORCINE) 1000 UNIT/ML IJ SOLN
2400.0000 [IU] | Freq: Once | INTRAMUSCULAR | Status: AC
  Administered 2023-08-01: 2400 [IU]

## 2023-07-28 MED ORDER — VANCOMYCIN VARIABLE DOSE PER UNSTABLE RENAL FUNCTION (PHARMACIST DOSING): Status: DC

## 2023-07-28 NOTE — Progress Notes (Signed)
 NAME:  Frank [name]., MRN:  [id number], DOB:  [birth year], LOS: 25 ADMISSION DATE:  07/03/2023, CONSULTATION DATE:  07/06/23 REFERRING MD:  Dr Maury Space, CHIEF COMPLAINT:  Encephalopathy   History of Present Illness:   PCCM asked to see patient for encephalopathy   Transferred from [facility] with 1 week of generalized weakness, altered mentation, decreased appetite Recently being worked up for multiple myeloma.  Transferred to [facility] for oncology evaluation Nephrology consulted for AKI   Background history of obesity, asthma, obstructive sleep apnea on CPAP GERD, hypertension, hyperlipidemia Has been feeling weak for the last few days according to spouse Admitted with concern for sepsis, community-acquired pneumonia, antibiotic encephalopathy Workup revealed lytic bony lesions with concern for multiple myeloma Transfused with 3 units packed red blood cells Evaluation so far-bone marrow 07/04/2023  Pertinent  Medical History   Past Medical History:  Diagnosis Date   Anxiety    Arthritis    Asthma    Bipolar disorder (HCC)    Current every day smoker    Depression    Diabetes mellitus, type II (HCC)    Dyspnea    History of kidney stones    History of pneumonia    Hyperlipidemia    Hypertension    Morbid obesity (HCC)    Sedentary lifestyle    Seizures (HCC)    last seizure 12 yrs ago, no current problem   Sleep apnea    uses CPAP nightly   Significant Hospital Events: Including procedures, antibiotic start and stop dates in addition to other pertinent events   4/19-blood cultures, MRSA PCR negative 4/19 chest x-ray with mild pulmonary congestion 4/20 atrial fibrillation with RVR 4/21 nosebleed after IR NG attempt, NG placed by PCCM in afternoon, stable transferred to [facility] 4/22 intubated; some abla requiring prbcs; ct abd/pelvis showing acute groin hematoma tracking from posterior presumed from site of bone marrow biopsy; heparin  held EEG 4/25 >> moderate to severe  encephalopathy without any seizures or epileptiform discharges 4/23 respiratory culture >> MRSE 4/28 remains intubated on CRRT, down to one pressor  4/29 labile BP, NE back up. Responded a bit to fluids. Received Velcade  and Cytoxan  chemotherapy 5/1 remains encephalopathic, check CTH 5/2 LP ETT exchange  5/5 1 PRBC. Palliative care met w family  5/6 IVIG. plan for trach established. Added midodrine  after failed vaso wean. Abx/fungal changed to levaquin   5/7 weaning pressors incr midodrine  incr lactulose . Vaso off  5/8 Trach placed. Some post op bleeding improved with thrombi-pad 5/10 voriconazole  added back based on CT Chest findings, MRI brain with no acute changes  Interim History / Subjective:   Tmax 103.4 Remains on Precedex , fentanyl  Arousable Not able to follow commands  Objective   Blood pressure 126/77, pulse (!) 102, temperature (!) 100.6 F (38.1 C), resp. rate 13, height 6' 0.01" (1.829 m), weight 105.1 kg, SpO2 97%.    Vent Mode: PRVC FiO2 (%):  [30 %] 30 % Set Rate:  [20 bmp] 20 bmp Vt Set:  [540 mL] 540 mL PEEP:  [5 cmH20] 5 cmH20 Plateau Pressure:  [15 cmH20-27 cmH20] 27 cmH20   Intake/Output Summary (Last 24 hours) at 07/28/2023 0743 Last data filed at 07/28/2023 1191 Gross per 24 hour  Intake 3025.9 ml  Output 920 ml  Net 2105.9 ml   Filed Weights   07/25/23 0434 07/26/23 0500 07/27/23 0700  Weight: 97.7 kg 100.6 kg 105.1 kg     Physical Exam: General: Chronically ill-appearing, moist oral mucosa HEENT: Tracheostomy in  place, no significant secretions Neuro: Moving all extremities spontaneously, not following commands CV: S1-S2 appreciated PULM: Coarse breath sounds, rales at the bases GI: Soft, bowel sounds appreciated Extremities: Trace bilateral lower extremity edema  __________________________________________________________  Micro: 4/28 BCx > no growth, final 4/30 respiratory culture-few PMN, rare budding yeast, abundant GPC> few aspergillus  fumigatus Quantiferon 5/2 > neg Trach asp 5/4> rare GPC, rare mold  PCP smear trach asp 5/5 > neg  Trach asp 5/9 > abundant staph hemolyticus, few budding yeast  CSF studies 1 RBC, 1 WBC, glucose 106 CSF culture-2 organisms acute, rare WBC> NGTD Cryptococcal antigen negative meningitis and encephalitis panel negative VDRL  non-reactive   AFB CSF 5/2 >>>>  BUN 90, creatinine 4.11 Albumin  less than 1.5  Reviewed CT scan of the chest 07/26/2023 Nodular densities, groundglass infiltration  Resolved Problems  Shock  Assessment & Plan:   Metabolic encephalopathy History of seizure disorder Hyperammonemia -MRI brain is unremarkable - Lumbar puncture-meningitis/encephalitis panel negative - Remains on 3 times daily lactulose  - Weaning sedation as tolerated - Delirium precautions -Continuing home Paxil  and gabapentin  - On VPA on hold  Acute hypoxemic respiratory failure Small bilateral pleural effusions History of obstructive sleep apnea History of tobacco use Aspergillus pneumonia - Continue as needed bronchodilators - Continue Brovana , continue Yupelri  - On Levaquin  and voriconazole  - Appreciate infectious disease follow-up  Small bilateral pleural effusions - Will continue to monitor  Sepsis due to pneumonia - Continue midodrine  - Continue antibiotics - Appreciate infectious disease follow-up  Persistent fevers may be in the context of fungal pneumonia Concern for paraneoplastic fevers in the context of his multiple myeloma - Continue voriconazole  -On naproxen -for 3 days  Multiple myeloma Neutropenia - Oncology continue to follow, appreciate follow-up - Received Velcade  and Cytoxan  on 4/29 - IVIG on 5/6 -S/p G-CSF -Chemotherapy currently on hold  Acute kidney injury on chronic kidney disease stage IIIa - CRRT was stopped on 510 - For HD  Paroxysmal atrial fibrillation - Now in sinus rhythm - Amiodarone  200 per tube-plan for 14 days to discontinue  5/20 - Could consider anticoagulation should he go back into atrial fibrillation  Multifactorial anemia Thrombocytopenia - This appears to be improving - May be related to critical illness, his multiple myeloma, possible drug-related - Continue to monitor closely   Type 2 diabetes - On glargine 10 units twice daily   GOC: See IPAL from 5/7. Wife now updating goals to include 2-3 rounds of CPR maximum should he arrest.  Best Practice (right click and "Reselect all SmartList Selections" daily)   Diet/type: tubefeeds  DVT prophylaxis prophylactic heparin   Pressure ulcer(s): identified on: 19/Apr/2025 GI prophylaxis: H2B Lines: Dialysis Catheter Foley:  na  Code Status:  full code Last date of multidisciplinary goals of care discussion d/w wife 5/7. See IPAL  The patient is critically ill with multiple organ systems failure and requires high complexity decision making for assessment and support, frequent evaluation and titration of therapies, application of advanced monitoring technologies and extensive interpretation of multiple databases. Critical Care Time devoted to patient care services described in this note independent of APP/resident time (if applicable)  is 33 minutes.   Myer Artis MD [place] [facility] Personal pager: See Amion If unanswered, please page CCM On-call: #[phone]

## 2023-07-28 NOTE — Progress Notes (Signed)
 RT NOTE: CPT held at this time as PT is having bedside CRRT.

## 2023-07-28 NOTE — TOC Initial Note (Signed)
 Transition of Care (TOC) - Initial/Assessment Note    Patient Details  Name: Frank [name]. MRN: [id number] Date of Birth: 12-19-1965  Transition of Care [facility]) CM/SW Contact:    Jeani Mill, RN Phone Number: 07/28/2023, 12:28 PM  Clinical Narrative:                Patient transferred to [facility] ICU for iHD.  Patient has multiple myoloma and oncology is following. Patient on trach vent.   NCM unable to assess patient due to intubation at this time. Patient not medically stable for discharge.  NCM will continue to follow as patient progresses with care towards discharge.     Expected Discharge Plan:  (plan unclear at this point due to medical complexity of patients condition) Barriers to Discharge: Continued Medical Work up   Patient Goals and CMS Choice   CMS Medicare.gov Compare Post Acute Care list provided to::  (n/a) Choice offered to / list presented to : NA      Expected Discharge Plan and Services In-house Referral: NA Discharge Planning Services: CM Consult Post Acute Care Choice: NA Living arrangements for the past 2 months: Single Family Home                 DME Arranged: N/A DME Agency: NA       HH Arranged: NA HH Agency: NA        Prior Living Arrangements/Services Living arrangements for the past 2 months: Single Family Home Lives with:: Spouse Patient language and need for interpreter reviewed:: Yes        Need for Family Participation in Patient Care: Yes (Comment) Care giver support system in place?: Yes (comment) Current home services:  (n/a) Criminal Activity/Legal Involvement Pertinent to Current Situation/Hospitalization: No - Comment as needed  Activities of Daily Living   ADL Screening (condition at time of admission) Independently performs ADLs?: Yes (appropriate for developmental age) Is the patient deaf or have difficulty hearing?: No Does the patient have difficulty seeing, even when wearing glasses/contacts?:  Yes Does the patient have difficulty concentrating, remembering, or making decisions?: No  Permission Sought/Granted                  Emotional Assessment         Alcohol  / Substance Use: Not Applicable Psych Involvement: No (comment)  Admission diagnosis:  Generalized weakness [R53.1] Patient Active Problem List   Diagnosis Date Noted   Multiple myeloma without remission (HCC) 07/24/2023   Lobar pneumonia, unspecified organism (HCC) 07/21/2023   On mechanically assisted ventilation (HCC) 07/19/2023   Acute metabolic encephalopathy 07/19/2023   Acute encephalopathy 07/14/2023   Severe sepsis with septic shock (HCC) 07/14/2023   PAF (paroxysmal atrial fibrillation) (HCC) 07/09/2023   Acute respiratory failure with hypoxia (HCC) 07/08/2023   Atrial fibrillation with rapid ventricular response (HCC) 07/07/2023   AKI (acute kidney injury) (HCC) 07/07/2023   Multiple myeloma not having achieved remission (HCC) 07/04/2023   Generalized weakness 07/03/2023   Closed fracture of left distal humerus 01/29/2023   Status post total left knee replacement 11/23/2021   Unilateral primary osteoarthritis, right knee 01/25/2019   Carpal tunnel syndrome, left upper limb 10/15/2018   Carpal tunnel syndrome, right upper limb 10/15/2018   Bilateral hand numbness 09/09/2018   Plantar fasciitis of left foot 09/09/2018   Status post total replacement of left hip 12/26/2017   Unilateral primary osteoarthritis, left knee 11/12/2017   Hypogonadism, male 05/08/2016   PCP:  Christeen Cousin  Gorman Laughter, MD Pharmacy:   [facility] [phone] [place], [place] - [address] [place] [address] Phone: [phone] Fax: [phone]     Social Drivers of Health (SDOH) Social History: SDOH Screenings   Food Insecurity: No Food Insecurity (07/05/2023)  Housing: Low Risk  (07/05/2023)  Transportation Needs: No Transportation Needs (07/05/2023)  Utilities: Not At Risk (07/05/2023)   Tobacco Use: High Risk (07/10/2023)   SDOH Interventions:     Readmission Risk Interventions     No data to display

## 2023-07-28 NOTE — Progress Notes (Signed)
 Pharmacy Antibiotic Note  Drean Remme. is a 58 y.o. male admitted on 07/03/2023 with generalized weakness (multifactorial secondary to hypercalcemia, suspected malignancy, multiple myeloma).  On antibiotics since admission.  Was on voriconazole  5/4-5/6.  Currently finishing up 7d course of levofloxacin  for HCAP/atypicals. On 5/10, Pharmacy has been consulted to add back voriconazole  dosing per ID for IFI in the context of persistent fever, chest CT increasing nodular opacity, and sputum culture with aspergillus. Vancomycin  was added 5/11 for staph haemolyticus isolated on 5/9 RCx.   The patient's CRRT was stopped on 5/10 afternoon in anticipation of transfer to [facility] to attempt IHD. The patient's SCr has bumped up off CRRT with only 50 cc of UOP charted so presumed CrCl negligible.   Last Levaquin  dose was 5/11 @ 1302, dosing for IHD is q48h, planned EOT was 5/12. Yesterday's dose will provide adequate coverage to complete the intended duration.  The patient was loaded with Vancomycin  2g IV x 1 dose yesterday @ 1000, CRRT stopped at 1439, no additional doses needed at this time.   Will reduce Voriconazole  slightly to 350 mg IV every 12 hours (4 mg/kg AdjBW). Will need a trough level at steady state if continues.   Plan: - Reduce Voriconazole  to 350 mg (4 mg/kg AdjBW) IV every 12 hours - No standing Vancomycin  for now - f/u plans for IHD - D/c Levaquin  - dose from 5/11 will cover through intended EOT 5/12 - Will continue to follow HD schedule/duration, culture results, LOT, and antibiotic de-escalation plans   Height: 6' 0.01" (182.9 cm) Weight: 105.1 kg (231 lb 11.3 oz) IBW/kg (Calculated) : 77.62  Temp (24hrs), Avg:102 F (38.9 C), Min:99.3 F (37.4 C), Max:103.3 F (39.6 C)  Recent Labs  Lab 07/23/23 0416 07/23/23 1722 07/24/23 0421 07/24/23 1630 07/25/23 0409 07/25/23 1508 07/26/23 0423 07/27/23 0313 07/28/23 0406  WBC 12.6*  --  26.1*  --   --   --  29.2* 26.2* 25.9*   CREATININE 1.62*   < > 1.59* 1.53* 1.52* 1.62* 1.77*  1.74* 2.46*  --    < > = values in this interval not displayed.    Estimated Creatinine Clearance: 40.5 mL/min (A) (by C-G formula based on SCr of 2.46 mg/dL (H)).    Allergies  Allergen Reactions   Morphine And Codeine Anaphylaxis   Shellfish Allergy Anaphylaxis   Betadine  [Povidone Iodine ] Itching   Bupropion Other (See Comments)    Shaking of the body, hallucinations    Chlorhexidine  Hives    Patient reports never had issues CHG with mouth rinse.   Influenza Vaccines Hives   Metoprolol  Rash    Antimicrobials this admission: 4/17 Azith >> 4/20  4/17 CTX >> 4/22 4/18 Fluconazole  > 4/23 4/22 Zosyn  >> 5/1 4/28: Vanco>>5/05 5/1 Meropenem  >> 5/6 5/10 Unasyn >> 5/11 5/4 Voriconazole  >> 5/6, restart 5/10 >> 5/6 Levaquin  >> (5/12) 5/11 Vancomycin  >>  Microbiology results: 4/19: MRSA PCR: neg 4/19 UCx: ngF 4/19 BCx: ngF 4/23 TA: few Staph epi: MRSE 4/28 BCx: ngF 4/30 TA: rare aspergillus fumigatus 5/2 CSF: ngtd 5/4 tracheal asp: rare mold 5/5 TA PJP stain: neg 5/5 MRSA PCR: neg 5/5 BCx: NGTD 5/9 tracheal asp: abundant staph haemolyticus, report status pending  Thank you for allowing pharmacy to be a part of this patient's care.  Garland Junk, PharmD, BCPS, BCIDP Infectious Diseases Clinical Pharmacist 07/28/2023 8:09 AM   **Pharmacist phone directory can now be found on [email or web address] ([email or web address]).  Listed under [facility].

## 2023-07-28 NOTE — Progress Notes (Addendum)
 SLP Cancellation Note  Patient Details Name: Frank [name]. MRN: [id number] DOB: 23-Sep-1965   Cancelled treatment:        Attempted to see pt for swallowing and speaking valve assessments.  Pt requiring mechanical ventilation at this time. Spoke with RT, pt unable to wean from vent this morning and is likely not a good candidate for inline PMSV 2/2 mentation.  SLP will follow for medical readiness for evaluation.    Elester Grim, MA, CCC-SLP Acute Rehabilitation Services Office: [phone] 07/28/2023, 8:28 AM

## 2023-07-28 NOTE — Progress Notes (Signed)
 [facility]    Date of Admission:  07/03/2023   Total days of antibiotics day 14 of broad coverage(vanco/mero most recently plus        Day 3 vori   ID: Frank [name]. is a 58 y.o. male with newly diagnosed plasma cell myeloma, transferred from [facility] for management but had to be intubated on 4/22.initial resp cx of MRSE, s/p trach on 5/8 but recent cx showing aspergillus. Started velcade  and cytoxan  on 4/29, and ivig on 5/6. Principal Problem:   Generalized weakness Active Problems:   Multiple myeloma not having achieved remission (HCC)   Atrial fibrillation with rapid ventricular response (HCC)   AKI (acute kidney injury) (HCC)   Acute respiratory failure with hypoxia (HCC)   PAF (paroxysmal atrial fibrillation) (HCC)   Acute encephalopathy   Severe sepsis with septic shock (HCC)   On mechanically assisted ventilation (HCC)   Acute metabolic encephalopathy   Lobar pneumonia, unspecified organism (HCC)   Multiple myeloma without remission (HCC)    Subjective: Remains encephalopathic. Has had consistent fever up at 100.6 this afternoon  Medications:   amiodarone   200 mg Per NG tube Daily   arformoterol   15 mcg Nebulization BID   busPIRone   15 mg Per Tube QHS   clonazePAM   2 mg Per Tube BID   docusate  100 mg Per Tube BID   famotidine   10.4 mg Per Tube Daily   feeding supplement (PROSource TF20)  60 mL Per Tube Daily   folic acid   1 mg Per Tube Daily   gabapentin   100 mg Per Tube Q12H   heparin  injection (subcutaneous)  5,000 Units Subcutaneous Q8H   insulin  aspart  0-20 Units Subcutaneous Q4H   insulin  aspart  8 Units Subcutaneous Q4H   insulin  glargine-yfgn  10 Units Subcutaneous BID   lactulose   20 g Per Tube TID   leptospermum manuka honey  1 Application Topical Daily   lidocaine -EPINEPHrine   20 mL Intradermal Once   midodrine   10 mg Per Tube TID WC   naproxen   250 mg Per Tube BID WC   nicotine   7 mg Transdermal Daily   mouth  rinse  15 mL Mouth Rinse Q2H   oxyCODONE   5 mg Per Tube Q6H   PARoxetine   40 mg Per Tube Daily   pneumococcal 20-valent conjugate vaccine  0.5 mL Intramuscular Tomorrow-1000   polyethylene glycol  17 g Per Tube Daily   QUEtiapine   50 mg Per Tube QHS   revefenacin   175 mcg Nebulization Daily   sodium chloride  flush  10-40 mL Intracatheter Q12H   thiamine   100 mg Per Tube Daily   vancomycin  variable dose per unstable renal function (pharmacist dosing)   Does not apply See admin instructions    Objective: Vital signs in last 24 hours: Temp:  [98.2 F (36.8 C)-103.1 F (39.5 C)] 98.2 F (36.8 C) (05/12 1730) Pulse Rate:  [78-117] 79 (05/12 1730) Resp:  [10-24] 20 (05/12 1730) BP: (90-165)/(52-100) 112/69 (05/12 1730) SpO2:  [95 %-99 %] 99 % (05/12 1730) FiO2 (%):  [30 %] 30 % (05/12 1730)  Physical Exam  Constitutional:He appears well-developed and well-nourished. No distress.  HENT:  Mouth/Throat: Oropharynx is clear and moist.  Neck: tracheostomy in place Cardiovascular: Normal rate, regular rhythm and normal heart sounds. Exam reveals no gallop and no friction rub.  No murmur heard.  Pulmonary/Chest: Effort normal and breath sounds normal. No respiratory distress. He has no wheezes.  Abdominal: Soft. Bowel sounds are normal. He exhibits no distension. There is no tenderness.  Neurological: not following commands  Skin: Skin is warm and dry. No rash noted. No erythema.     Lab Results Recent Labs    07/27/23 0313 07/28/23 0406  WBC 26.2* 25.9*  HGB 8.6* 8.1*  HCT 29.5* 26.9*  NA 136 139  K 5.1 5.2*  CL 105 105  CO2 24 21*  BUN 57* 90*  CREATININE 2.46* 4.11*   Liver Panel Recent Labs    07/27/23 0313 07/28/23 0406  PROT 8.1 7.9  ALBUMIN  1.6* <1.5*  AST 12* 11*  ALT 10 11  ALKPHOS 119 95  BILITOT 0.6 0.7   Sedimentation Rate No results for input(s): "ESRSEDRATE" in the last 72 hours. C-Reactive Protein No results for input(s): "CRP" in the last 72  hours.  Microbiology: 5/9 fungitell repeat in process 5/9 respiratory culture GPC, budding yeast, cx staph hemolyticus , mold  5/5 mrsa nares pcr negative 5/5 trach aspirate for pjp smear negative 5/5 bcx negative 5/4 fungitel positive 5/4 resp cx few gnr, few yeast, rare gpc on stain only; rare mold 5/2 csf pcr negative; csf culture negative 5/2 quantiferon gold negative 4/30 resp cx rare aspergillus fumigatus 4/28 bcx ngtd 4/23 tracheal aspirate mrse 4/19 bcx ngtd 4/19 ucx negative 4/19 mrsa nares negative Studies/Results: No results found.   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 Continue with voriconazole   Recommend to stop levofloxacin  since no longer neutropenic  Plasma cell myeloma = currently followed by dr Maria Shiner. He received velcade  and cytoxan  in late April, plus high dose chemo. Supported with G-CSF to shorten period on neutropenia. Defer to onc team for next steps  Fevers =suspect it is due to malignancy  Leukocytosis = continue to trend but likely response from GCSF. Wil continue to monitor  I have personally spent 50 minutes involved in face-to-face and non-face-to-face activities for this patient on the day of the visit. Professional time spent includes the following activities: Preparing to see the patient (review of tests), Obtaining and/or reviewing separately obtained history (admission/discharge record), Performing a medically appropriate examination and/or evaluation , Ordering medications/tests/procedures, referring and communicating with other health care professionals, Documenting clinical information in the EMR, Independently interpreting results (not separately reported), Communicating results to the patient/family/caregiver, Counseling and educating the patient/family/caregiver and Care coordination (not separately reported).    evaluation of this  patient requires complex antimicrobial therapy evaluation and counseling and isolation needs for disease transmission risk assessment and mitigation.    [facility] Pager: [phone]  07/28/2023, 5:46 PM

## 2023-07-28 NOTE — Progress Notes (Addendum)
 Frank [name].   DOB:1965-05-06   BJ#:[id number]      ASSESSMENT & PLAN:  Patient transferred from [facility] mid April 2025 with approx one week history of generalized weakness, decreased appetite and altered mental status.  He was admitted with concern for sepsis, PNA, and encephalopathy.  Work-up was done and showed lytic lesions concerning for multiple myeloma. Oncology asked to evaluate and manage.   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 - Initiated chemotherapy with Velcade  and Cytoxan  07/08/23 with second dose given 07/11/23.  Also given high-dose steroids.   -- Good response to chemotherapy as evidenced by decreased myeloma levels.  -- status post IVIG.  S/p G-CSF for neutropenia  - Chemotherapy currently on Hold.  However may restart if protein levels trend upwards. Dr. Maria Shiner will review and make recommendations.  - Medical oncology/Dr. Maria Shiner following closely   Acute respiratory failure Hypoxia - Remains intubated since 4/22, now with trach placed 5/8 to vent.  - Management per critical care   Anemia - Likely secondary to malignancy - Hgb 8.1 today. Recommend PRBC transfusion for hemoglobin <7.0 - Monitor CBC with differential closely  Leukocytosis -- elevated WBC 25.9 -- likely secondary to GCS-F given -- monitor fever curve; monitor CBC with diff   CKD - Remains anuric, Creatinine worsening 4.11 today  - Has been on CRRT, transferred from [facility] to [facility] for hd.   - Nephrology managing   Altered mental status - Admitted with agitation and confusion.   -- Remains intubated. - Continue supportive care   Hypertension Hyperlipidemia Diabetes - Monitor blood pressure levels - Monitor blood glucose levels, insulin  coverage per protocol - Medicine following    Code Status Full  Subjective:  Patient seen resting.  He is chronically ill-appearing, +trach  to vent intact. Patient was transferred from [facility] to [facility] over the weekend due to plans for intermittent dialysis as patient remains anuric.    Objective:  Vitals:   07/28/23 1200 07/28/23 1300  BP: 119/70 101/63  Pulse: (!) 101 93  Resp: 10 20  Temp: (!) 100.9 F (38.3 C) (!) 100.4 F (38 C)  SpO2: 95% 96%     Intake/Output Summary (Last 24 hours) at 07/28/2023 1501 Last data filed at 07/28/2023 1200 Gross per 24 hour  Intake 2264.27 ml  Output 920 ml  Net 1344.27 ml     PHYSICAL EXAMINATION: ECOG PERFORMANCE STATUS: 4 - Bedbound  Vitals:   07/28/23 1200 07/28/23 1300  BP: 119/70 101/63  Pulse: (!) 101 93  Resp: 10 20  Temp: (!) 100.9 F (38.3 C) (!) 100.4 F (38 C)  SpO2: 95% 96%   Filed Weights   07/25/23 0434 07/26/23 0500 07/27/23 0700  Weight: 215 lb 6.2 oz (97.7 kg) 221 lb 12.5 oz (100.6 kg) 231 lb 11.3 oz (105.1 kg)    GENERAL: +ill-appearing SKIN: +pale skin color OROPHARYNX: +trach to vent intact NECK: supple, thyroid normal size, non-tender, without nodularity LUNGS: clear to auscultation and percussion with normal breathing effort HEART: regular rate & rhythm and no murmurs and no lower extremity edema ABDOMEN: abdomen soft, non-tender and normal bowel sounds MUSCULOSKELETAL: no cyanosis of digits and no clubbing  PSYCH:+sedated    All questions were answered. The patient knows to call the clinic with any problems, questions or concerns.   The total time spent in the appointment was 40 minutes encounter with patient including review of chart  and various tests results, discussions about plan of care and coordination of care plan  Jacqualin Mate, NP 07/28/2023 3:01 PM    Labs Reviewed:  Lab Results  Component Value Date   WBC 25.9 (H) 07/28/2023   HGB 8.1 (L) 07/28/2023   HCT 26.9 (L) 07/28/2023   MCV 100.7 (H) 07/28/2023   PLT 231 07/28/2023   Recent Labs    07/23/23 0416 07/23/23 1722 07/26/23 0423 07/27/23 0313 07/28/23 0406  NA 128*    < > 134*  135 136 139  K 4.2   < > 4.4  4.5 5.1 5.2*  CL 99   < > 104  106 105 105  CO2 24   < > 24  24 24  21*  GLUCOSE 128*   < > 132*  129* 184* 124*  BUN 33*   < > 41*  42* 57* 90*  CREATININE 1.62*   < > 1.77*  1.74* 2.46* 4.11*  CALCIUM  7.5*   < > 7.7*  7.7* 7.6* 8.0*  GFRNONAA 49*   < > 44*  45* 29* 16*  PROT 8.5*  --  8.7* 8.1 7.9  ALBUMIN  <1.5*  <1.5*   < > 1.8*  1.6* 1.6* <1.5*  AST 19  --  16 12* 11*  ALT 14  --  14 10 11   ALKPHOS 59  --  129* 119 95  BILITOT 0.5  --  0.4 0.6 0.7  BILIDIR <0.1  --   --   --   --   IBILI NOT CALCULATED  --   --   --   --    < > = values in this interval not displayed.    Studies Reviewed:  MR BRAIN WO CONTRAST Result Date: 07/26/2023 CLINICAL DATA:  Follow-up examination for aspergillosis, altered mental status, immunosuppressed. EXAM: MRI HEAD WITHOUT CONTRAST TECHNIQUE: Multiplanar, multiecho pulse sequences of the brain and surrounding structures were obtained without intravenous contrast. COMPARISON:  MRI from 07/18/2023. FINDINGS: Brain: Cerebral volume within normal limits for age. Patchy T2/FLAIR hyperintensity involving the supratentorial cerebral white matter, most like related chronic microvascular ischemic disease, stable. No evidence for acute or subacute infarct. No areas of chronic cortical infarction or other insult. No acute intracranial hemorrhage. Single punctate chronic microhemorrhage noted at the left frontal corona radiata, stable. No mass lesion, midline shift or mass effect. Ventricles stable in size without hydrocephalus. No extra-axial fluid collection. Pituitary gland and suprasellar region within normal limits. No evidence for acute CNS infection by MRI. Vascular: Major intracranial vascular flow voids are stable and maintained. Skull and upper cervical spine: Craniocervical junction within normal limits. Diffusely abnormal appearance of the visualized osseous structures, consistent with Mets or myeloma,  relatively stable from prior. No scalp soft tissue abnormality. Sinuses/Orbits: Globes and orbital soft tissues within normal limits. Scattered mucosal thickening present about the sphenoid and maxillary sinuses. Large right with moderate left mastoid effusions. Other: Incidental cystic lesion at the nasal bridge again noted, likely a dermoid/epidermoid, stable. IMPRESSION: 1. Stable brain MRI. No acute intracranial abnormality. 2. Diffusely abnormal appearance of the visualized osseous structures, concerning for metastatic disease or myeloma, stable. 3. Moderate cerebral white matter disease, nonspecific, but most commonly related to chronic microvascular ischemic disease. 4. Large right with moderate left mastoid effusions, of uncertain significance. Correlation with physical exam recommended. Electronically Signed   By: Virgia Griffins M.D.   On: 07/26/2023 18:25   DG Chest Port 1 View Result Date: 07/26/2023 CLINICAL DATA:  [id number]  Increased tracheal secretions [id number] EXAM: PORTABLE CHEST 1 VIEW COMPARISON:  Jul 24, 2023 FINDINGS: The cardiomediastinal silhouette is unchanged in contour.Tracheostomy. The enteric tube courses through the chest to the abdomen with weighted metallic tip projecting over the stomach. RIGHT IJ CVC catheter tip terminates over the SVC. No pleural effusion. No pneumothorax. Perihilar vascular fullness. There is bronchial wall cuffing. Biapical airspace opacities and LEFT retrocardiac airspace opacity. Advanced degenerative changes of the shoulders. IMPRESSION: 1. Bronchial wall thickening with patchy bilateral airspace opacities likely reflect multifocal infection. Differential considerations include pulmonary edema. Electronically Signed   By: Clancy Crimes M.D.   On: 07/26/2023 10:31   CT CHEST WO CONTRAST Result Date: 07/26/2023 CLINICAL DATA:  Hypoxic respiratory failure. Evaluate for changes suggestive of invasive fungal infection. EXAM: CT CHEST WITHOUT CONTRAST  TECHNIQUE: Multidetector CT imaging of the chest was performed following the standard protocol without IV contrast. RADIATION DOSE REDUCTION: This exam was performed according to the departmental dose-optimization program which includes automated exposure control, adjustment of the mA and/or kV according to patient size and/or use of iterative reconstruction technique. COMPARISON:  07/20/2023 FINDINGS: Cardiovascular: Heart size is normal. There is no pericardial effusion. Aortic atherosclerosis and coronary artery calcifications. Mediastinum/Nodes: Tracheostomy tube tip is above the carina. Enteric tube tip is in the body of the stomach. No mediastinal or hilar adenopathy. Lungs/Pleura: Decreased volume of bilateral pleural effusions. Partial atelectasis of bilateral lower lobes identified. Progressive ground-glass and airspace densities within the left upper lobe noted. There also multiple nodular densities with surrounding ground-glass attenuation throughout both upper lung zones, also increased from the previous exam. Upper Abdomen: No acute abnormality. Nonobstructing stone within the upper pole of the right kidney, image 154/2. Musculoskeletal: Multiple lucent bone lesions are identified throughout the visualized bony thorax. These appears similar in distribution to the previous exam. Unchanged mild compression deformity involving the superior endplate of the T11 vertebra. IMPRESSION: 1. Decreased volume of bilateral pleural effusions. 2. Progressive ground-glass and airspace densities within the left upper lobe. There also multiple nodular densities with surrounding ground-glass attenuation throughout both upper lung zones, also increased from the previous exam. Findings are favored to represent multifocal pneumonia. Nodular densities with surrounding ground-glass attenuation may be seen in the setting of fungal infection particularly and anion 0 compromised in visual. 3. Multiple lucent bone lesions are  identified throughout the visualized bony thorax. These appears similar in distribution to the previous exam. Unchanged mild compression deformity involving the superior endplate of the T11 vertebra. Imaging findings are concerning for metastatic disease versus multiple myeloma. 4. Nonobstructing right renal stone. 5. Coronary artery calcifications. 6.  Aortic Atherosclerosis (ICD10-I70.0). Electronically Signed   By: Kimberley Penman M.D.   On: 07/26/2023 04:56   VAS US  UPPER EXTREMITY VENOUS DUPLEX Result Date: 07/25/2023 UPPER VENOUS STUDY  Patient Name:  Avid Sollie.  Date of Exam:   07/24/2023 Medical Rec #: [id number]           Accession #:    [id number] Date of Birth: 1965-12-05           Patient Gender: M Patient Age:   53 years Exam Location:  [facility] Procedure:      VAS US  UPPER EXTREMITY VENOUS DUPLEX Referring Phys: Donavon Fudge HOFFMAN --------------------------------------------------------------------------------  Indications: Edema Risk Factors: Multiple myeloma / chemotherapy. Comparison Study: No previous exams Performing Technologist: Jody Hill RVT, RDMS  Examination Guidelines: A complete evaluation includes B-mode imaging, spectral Doppler, color Doppler, and power Doppler as needed of  all accessible portions of each vessel. Bilateral testing is considered an integral part of a complete examination. Limited examinations for reoccurring indications may be performed as noted.  Right Findings: Subclvian vein not visualized due to patient recieving HD bedside  Left Findings: +----------+------------+---------+-----------+----------+---------------------+ LEFT      CompressiblePhasicitySpontaneousProperties       Summary        +----------+------------+---------+-----------+----------+---------------------+ IJV           Full       Yes       Yes                                    +----------+------------+---------+-----------+----------+---------------------+ Subclavian    Full        Yes       Yes                                    +----------+------------+---------+-----------+----------+---------------------+ Axillary      Full       Yes       Yes                                    +----------+------------+---------+-----------+----------+---------------------+ Brachial      Full       Yes       Yes                                    +----------+------------+---------+-----------+----------+---------------------+ Radial        Full                                                        +----------+------------+---------+-----------+----------+---------------------+ Ulnar         Full                                   only one of paired                                                            visualized       +----------+------------+---------+-----------+----------+---------------------+ Cephalic      Full                                                        +----------+------------+---------+-----------+----------+---------------------+ Basilic       Full       Yes       Yes                                    +----------+------------+---------+-----------+----------+---------------------+ Large  non vascularized heterogenous area in mid and distal upper arm with cystic and solid appearing areas.  Summary:  Right: Unable to visualize subclavian vein on this exam.  Left: No evidence of deep vein thrombosis in the upper extremity. No evidence of superficial vein thrombosis in the upper extremity. Large non vascularized heterogenous area in mid and distal upper arm with cystic and solid appearing areas.  *See table(s) above for measurements and observations.  Diagnosing physician: Runell Countryman Electronically signed by Runell Countryman on 07/25/2023 at 9:32:22 AM.    Final    DG Chest Port 1 View Result Date: 07/24/2023 CLINICAL DATA:  Status post tracheostomy. EXAM: PORTABLE CHEST 1 VIEW COMPARISON:  Chest radiograph dated 07/22/2023.  FINDINGS: Tracheostomy with tip approximately 5.5 cm above the carina. Enteric tube extends below the diaphragm with tip beyond the inferior margin of the image. Right IJ central venous line in similar position. Similar appearance of bilateral pulmonary opacities. No pleural effusion or pneumothorax. The cardiac silhouette. No acute osseous pathology. IMPRESSION: 1. Tracheostomy with tip approximately 5.5 cm above the carina. 2. Similar appearance of bilateral pulmonary opacities. Electronically Signed   By: Angus Bark M.D.   On: 07/24/2023 14:17   DG CHEST PORT 1 VIEW Result Date: 07/22/2023 CLINICAL DATA:  Aspiration pneumonia. EXAM: PORTABLE CHEST 1 VIEW COMPARISON:  Jul 18, 2023. FINDINGS: Stable cardiomediastinal silhouette. Tracheostomy and feeding tubes are unchanged. Right internal jugular catheter is unchanged. Increased bilateral lung opacities are noted diffusely consistent with edema or atypical inflammation. Severe degenerative changes are seen involving both glenohumeral joints. IMPRESSION: Stable support apparatus. Increased bilateral lung opacities concerning for edema or pneumonia. Electronically Signed   By: Rosalene Colon M.D.   On: 07/22/2023 14:39   DG Abd Portable 1V Result Date: 07/22/2023 CLINICAL DATA:  Ileus. EXAM: PORTABLE ABDOMEN - 1 VIEW COMPARISON:  July 07, 2023. FINDINGS: The bowel gas pattern is normal. Distal tip of feeding tube is seen in expected position of proximal stomach. No radio-opaque calculi or other significant radiographic abnormality are seen. IMPRESSION: No abnormal bowel dilatation. Electronically Signed   By: Rosalene Colon M.D.   On: 07/22/2023 14:38   CT CHEST WO CONTRAST Result Date: 07/20/2023 CLINICAL DATA:  Sepsis EXAM: CT CHEST WITHOUT CONTRAST TECHNIQUE: Multidetector CT imaging of the chest was performed following the standard protocol without IV contrast. RADIATION DOSE REDUCTION: This exam was performed according to the departmental  dose-optimization program which includes automated exposure control, adjustment of the mA and/or kV according to patient size and/or use of iterative reconstruction technique. COMPARISON:  None Available. FINDINGS: Cardiovascular: No significant vascular findings. The heart is mildly enlarged. There is no pericardial effusion. Aorta is normal in size. Central venous catheter tip ends in the SVC. Mediastinum/Nodes: Endotracheal tube tip is 2.4 cm above the carina. Enteric tube is seen throughout nondilated esophagus. No enlarged lymph nodes are identified allowing for lack of intravenous contrast. The visualized thyroid gland is within normal limits. Lungs/Pleura: There are small bilateral pleural effusions. There is bilateral lower lobe airspace consolidation. Multifocal patchy airspace areas and ill-defined ground-glass slightly nodular opacities are seen throughout the bilateral upper lobes and minimally in the right middle lobe, also likely infectious/inflammatory. There is no pneumothorax. Upper Abdomen: Gallstones are present. Enteric tube is partially visualized in the stomach. Musculoskeletal: There are severe degenerative changes in both shoulders. The bones are diffusely osteopenic. There are scattered lucent lesions throughout the thoracic spine measuring up to 8 mm. There is minimal compression  deformity of the superior endplate of T11. IMPRESSION: 1. Bilateral lower lobe airspace consolidation compatible with pneumonia. 2. Multifocal patchy airspace areas and ill-defined ground-glass slightly nodular opacities throughout the bilateral upper lobes and minimally in the right middle lobe, also likely infectious/inflammatory. 3. Small bilateral pleural effusions. 4. Mild cardiomegaly. 5. Cholelithiasis. 6. Scattered lucent lesions throughout the thoracic spine measuring up to 8 mm, nonspecific. Findings may be related to multiple myeloma or metastatic disease. 7. Minimal compression deformity of the superior  endplate of T11. Electronically Signed   By: Tyron Gallon M.D.   On: 07/20/2023 16:31   VAS US  LOWER EXTREMITY VENOUS (DVT) Result Date: 07/18/2023  Lower Venous DVT Study Patient Name:  Chidi Chann.  Date of Exam:   07/18/2023 Medical Rec #: [id number]           Accession #:    [id number] Date of Birth: [birth year]           Patient Gender: M Patient Age:   27 years Exam [facility]:  [facility] Procedure:      VAS US  LOWER EXTREMITY VENOUS (DVT) Referring Phys: ADITYA PALIWAL --------------------------------------------------------------------------------  Indications: Swelling, and Edema.  Risk Factors: Immobility Cancer myeloma. Limitations: Patient positioning. Comparison Study: None. Performing Technologist: Estanislao Heimlich  Examination Guidelines: A complete evaluation includes B-mode imaging, spectral Doppler, color Doppler, and power Doppler as needed of all accessible portions of each vessel. Bilateral testing is considered an integral part of a complete examination. Limited examinations for reoccurring indications may be performed as noted. The reflux portion of the exam is performed with the patient in reverse Trendelenburg.  +-----+---------------+---------+-----------+----------+--------------+ RIGHTCompressibilityPhasicitySpontaneityPropertiesThrombus Aging +-----+---------------+---------+-----------+----------+--------------+ CFV  Full           Yes      Yes                                 +-----+---------------+---------+-----------+----------+--------------+ Large hematoma noted at top of right leg  +---------+---------------+---------+-----------+----------+-------------------+ LEFT     CompressibilityPhasicitySpontaneityPropertiesThrombus Aging      +---------+---------------+---------+-----------+----------+-------------------+ CFV      Full           Yes      Yes                                       +---------+---------------+---------+-----------+----------+-------------------+ SFJ      Full                                                             +---------+---------------+---------+-----------+----------+-------------------+ FV Prox  Full                                                             +---------+---------------+---------+-----------+----------+-------------------+ FV Mid   Full                                                             +---------+---------------+---------+-----------+----------+-------------------+  FV DistalFull                                                             +---------+---------------+---------+-----------+----------+-------------------+ PFV      Full                                                             +---------+---------------+---------+-----------+----------+-------------------+ POP      Full           Yes      Yes                                      +---------+---------------+---------+-----------+----------+-------------------+ PTV                              Yes                  Not well visualized +---------+---------------+---------+-----------+----------+-------------------+ PERO                             Yes                  Not well visualized +---------+---------------+---------+-----------+----------+-------------------+     Summary: RIGHT: - No evidence of common femoral vein obstruction.   LEFT: - There is no evidence of deep vein thrombosis in the lower extremity.  - No cystic structure found in the popliteal fossa.  *See table(s) above for measurements and observations. Electronically signed by Delaney Fearing on 07/18/2023 at 4:52:00 PM.    Final    DG Chest 1 View Result Date: 07/18/2023 CLINICAL DATA:  Endotracheal tube. EXAM: CHEST  1 VIEW COMPARISON:  Jul 17, 2023. FINDINGS: Endotracheal and feeding tube are in grossly good position. Right internal jugular catheter is  unchanged. Mild bibasilar subsegmental atelectasis is noted with small pleural effusions. Degenerative changes are seen involving both shoulder joints. IMPRESSION: Support apparatus as noted above. Bibasilar atelectasis and probable small pleural effusions are noted. Electronically Signed   By: Rosalene Colon M.D.   On: 07/18/2023 15:58   DG FL GUIDED LUMBAR PUNCTURE Result Date: 07/18/2023 CLINICAL DATA:  Metabolic encephalopathy EXAM: DIAGNOSTIC LUMBAR PUNCTURE UNDER FLUOROSCOPIC GUIDANCE COMPARISON:  I assessed the brain imaging of 07/17/2023 and the CT abdomen of 07/08/2023 in preparation for this exam. FLUOROSCOPY: Radiation Exposure Index (as provided by the fluoroscopic device): 3.4 mGy Kerma PROCEDURE: The risks (including hemorrhage, infection, and nerve damage, among others), benefits, and alternatives to fluoroscopically guided lumbar puncture were discussed with the patient's wife by telephone, as the patient is unable to consent and has altered mental status. The patient's wife understood and elected for the patient to undergo the procedure. Standard time-out was employed. Following sterile skin prep and local anesthetic administration consisting of 1 percent lidocaine , a 22 gauge spinal needle was advanced without difficulty into the thecal sac at the L2-3 level under fluoroscopic guidance. Clear CSF was returned. Because the patient is  intubated, I did not turn him onto his side to obtain a pressure measurement. A total of 11.5 cc of clear CSF was collected in 4 vials. The needle was subsequently removed and the skin cleansed and bandaged. No immediate complications were observed. IMPRESSION: Technically successful fluoroscopically guided lumbar puncture at the L2-3 level. Electronically Signed   By: Freida Jes M.D.   On: 07/18/2023 13:39   MR BRAIN WO CONTRAST Result Date: 07/18/2023 CLINICAL DATA:  Delirium EXAM: MRI HEAD WITHOUT CONTRAST TECHNIQUE: Multiplanar, multiecho pulse  sequences of the brain and surrounding structures were obtained without intravenous contrast. COMPARISON:  CT head 07/17/2023. FINDINGS: Brain: No acute infarct. Punctate susceptibility in the left corona radiata suggestive of chronic microhemorrhage. No additional foci of hemorrhage appreciated. Scattered and confluent T2/FLAIR hyperintensity in the periventricular and subcortical white matter. No mass lesion or midline shift. Cerebellum is unremarkable. Normal appearance of midline structures. The basilar cisterns are patent. No extra-axial fluid collections. Ventricles: Normal size and configuration of the ventricles. Vascular: Skull base flow voids are visualized. Skull and upper cervical spine: Facet arthrosis in the visualized upper cervical spine most pronounced on the right at C3-4. Redemonstration of multiple calvarial lesions corresponding to lytic lesions on CT. Sinuses/Orbits: The orbits are symmetric. Minimal mucosal thickening in the alveolar recess of the right maxillary sinus. There is a 1.3 x 1.0 x 1.5 cm circumscribed cystic appearing lesion in the subcutaneous tissues overlying the nasal bones slightly right of midline which demonstrates internal restricted diffusion. No evidence of connection to the intracranial compartment. Other: Large right mastoid effusion. Trace fluid in the left mastoid air cells. Fluid within the nasopharynx. Partially visualized enteric tube and endotracheal tube. IMPRESSION: No acute intracranial abnormality. Nonspecific white matter signal abnormality likely reflecting moderate chronic microvascular ischemic changes. Multiple calvarial lesions corresponding to lytic lesions on CT. Findings concerning for multiple myeloma versus metastasis. 1.5 cm cystic lesion in the subcutaneous tissues overlying the nasal bones likely reflecting epidermoid cyst versus dermoid. No evidence of connection to the intracranial compartment. Large right mastoid effusion. Electronically  Signed   By: Denny Flack M.D.   On: 07/18/2023 12:54   CT HEAD WO CONTRAST ( ) Result Date: 07/17/2023 CLINICAL DATA:  58 year old male altered mental status. Respiratory failure. "Myeloma". EXAM: CT HEAD WITHOUT CONTRAST TECHNIQUE: Contiguous axial images were obtained from the base of the skull through the vertex without intravenous contrast. RADIATION DOSE REDUCTION: This exam was performed according to the departmental dose-optimization program which includes automated exposure control, adjustment of the mA and/or kV according to patient size and/or use of iterative reconstruction technique. COMPARISON:  No prior head CT.  CT Abdomen and Pelvis 07/08/2023. FINDINGS: Brain: Cerebral volume is within normal limits for age. No midline shift, ventriculomegaly, mass effect, evidence of mass lesion, intracranial hemorrhage or evidence of cortically based acute infarction. Gray-white differentiation maintained, mild to moderate for age scattered cerebral white matter hypodensity. Vascular: Calcified atherosclerosis at the skull base. No suspicious intracranial vascular hyperdensity. Skull: Scattered nonspecific round an oval lucent areas in the bilateral calvarium. No pathologic fracture identified. Sinuses/Orbits: Intubated. Right nasoenteric tube in place. Relatively well aerated paranasal sinuses, partial opacification of the right middle ear and mastoids. Other: Visualized orbits and scalp soft tissues are within normal limits. IMPRESSION: 1. No acute intracranial abnormality. Mild to moderate for age cerebral white matter changes most commonly due to small vessel disease. 2. Scattered lytic lesions in the skull, similar to the skeletal findings on 07/08/2023, and highly suspicious for  Multiple Myeloma versus lytic metastases. 3. Intubated. Electronically Signed   By: Marlise Simpers M.D.   On: 07/17/2023 14:37   DG CHEST PORT 1 VIEW Result Date: 07/17/2023 CLINICAL DATA:  Acute respiratory failure.  Hypoxia. EXAM:  PORTABLE CHEST 1 VIEW COMPARISON:  07/13/2023 FINDINGS: A feeding tube passes into the stomach although the distal tip position is not included on the film. Right IJ central line tip overlies the proximal SVC level. Low volume film with interval development of vascular congestion basilar predominant interstitial opacity, concerning for edema. Probable small left effusion. Cardiopericardial silhouette is at upper limits of normal for size. Degenerative changes noted in both shoulders. Telemetry leads overlie the chest. IMPRESSION: Low volume film with interval development of vascular congestion and basilar predominant interstitial opacity, concerning for edema. Probable small left effusion. Electronically Signed   By: Donnal Fusi M.D.   On: 07/17/2023 07:41   DG Chest Port 1 View Result Date: 07/13/2023 CLINICAL DATA:  Respiratory failure EXAM: PORTABLE CHEST 1 VIEW COMPARISON:  07/09/2023 FINDINGS: Support apparatus: Endotracheal tube terminates 5.9 cm above carina. Right IJ Cordis sheath unchanged. Feeding tube extends beyond the inferior aspect of the film. Heart/mediastinum: Midline trachea.  Normal heart size. Pleura: Possible trace left pleural fluid.  No pneumothorax. Lungs: Persistent left and increased right base airspace disease. Other: Right glenohumeral joint degenerative change. IMPRESSION: Similar left and increased right base airspace disease, favoring atelectasis. Possible trace left pleural fluid. Electronically Signed   By: Lore Rode M.D.   On: 07/13/2023 09:39   EEG adult Result Date: 07/11/2023 Arleene Lack, MD     07/11/2023  3:28 PM Patient Name: Brentin Alana. MRN: [id number] Epilepsy Attending: Arleene Lack Referring Physician/Provider: Casimiro Cleaves, PA-C Date: 07/11/2023 Duration: 24.55 mins Patient history: 58yo M with ams. EEG to evaluate for seizure Level of alertness:  comatose/ lethargic AEDs during EEG study: VPA Technical aspects: This EEG study was done with  scalp electrodes positioned according to the 10-20 International system of electrode placement. Electrical activity was reviewed with band pass filter of 1-70Hz , sensitivity of 7 uV/mm, display speed of 73mm/sec with a 60Hz  notched filter applied as appropriate. EEG data were recorded continuously and digitally stored.  Video monitoring was available and reviewed as appropriate. Description: EEG showed continuous generalized 3-5 hz theta-delta slowing, at times with triphasic morphology.  Hyperventilation and photic stimulation were not performed.   ABNORMALITY - Continuous slow, generalized IMPRESSION: This study is suggestive of moderate to severe encephalopathy, could be secondary to toxic metabolic causes. No seizures or epileptiform discharges were seen throughout the recording. Arleene Lack   DG Chest 1 View Result Date: 07/09/2023 CLINICAL DATA:  Status post central line placement EXAM: PORTABLE CHEST 1 VIEW COMPARISON:  07/08/2023 FINDINGS: Endotracheal tube and feeding catheter are noted in satisfactory position. New right jugular catheter is seen in satisfactory position. No pneumothorax is noted. The overall inspiratory effort is poor with crowding of the vascular markings. IMPRESSION: Poor inspiratory effort. No pneumothorax following central line placement. Electronically Signed   By: Violeta Grey M.D.   On: 07/09/2023 11:19   CT ABDOMEN PELVIS WO CONTRAST Result Date: 07/08/2023 CLINICAL DATA:  Palpable right groin mass. Anemia requiring 3 units of packed red blood cells. Sepsis due to acquired pneumonia. Lytic bone lesions with a clinical concern for a new diagnosis of multiple myeloma. Type 2 diabetes. EXAM: CT ABDOMEN AND PELVIS WITHOUT CONTRAST TECHNIQUE: Multidetector CT imaging of the abdomen and pelvis  was performed following the standard protocol without IV contrast. RADIATION DOSE REDUCTION: This exam was performed according to the departmental dose-optimization program which  includes automated exposure control, adjustment of the mA and/or kV according to patient size and/or use of iterative reconstruction technique. COMPARISON:  CT-guided right iliac bone marrow aspiration and core biopsy dated 07/04/2023. FINDINGS: Lower chest: Multifocal patchy opacities in both lower lobes. Small left pleural effusion and minimal right pleural effusion. Borderline enlarged heart. Hepatobiliary: Normal-appearing liver. Dependent calcification in the gallbladder without gallbladder wall thickening or pericholecystic fluid. Pancreas: Unremarkable. No pancreatic ductal dilatation or surrounding inflammatory changes. Spleen: Normal in size without focal abnormality. Adrenals/Urinary Tract: Foley catheter in the urinary bladder with no significant urine in the bladder. Single tiny calculus in each kidney. Small, exophytic high density left renal cyst compatible with a hemorrhagic or proteinaceous cyst. Probable subcentimeter exophytic left renal cyst, difficult to assess due to streak artifacts produced by the patient's left arm. This could be assessed with renal ultrasound. Unremarkable adrenal glands and ureters. Stomach/Bowel: Stomach is within normal limits. Appendix appears normal. No evidence of bowel wall thickening, distention, or inflammatory changes. Vascular/Lymphatic: Atheromatous arterial calcifications without aneurysm. No enlarged lymph nodes. Reproductive: Normal-sized prostate gland containing coarse calcifications. Other: Large right buttock and groin hematoma containing a fluid/hematocrit level. This measures 36.0 x 11.4 x 9.7 cm in maximum dimensions on sagittal and coronal images 32/7 and 39/6 respectively. Musculoskeletal: Left hip prosthesis with associated streak artifacts. Nondisplaced right 10th posterolateral rib fracture. Old, healed left posterolateral 10th rib fracture. Multiple small to medium-sized lytic lesions in multiple lumbar and lower thoracic vertebral bodies and in  the pelvic bones bilaterally. Moderate right hip degenerative changes. IMPRESSION: 1. Large acute right buttock and groin hematoma measuring 36.0 x 11.4 x 9.7 cm. 2. Nondisplaced right 10th posterolateral rib fracture. 3. Multiple small to medium-sized lytic lesions in multiple lumbar and lower thoracic vertebral bodies and in the pelvic bones bilaterally, compatible with the clinical suspicion of multiple myeloma. Lytic metastases can also have this appearance. 4. Multifocal patchy opacities in both lower lobes, compatible with known pneumonia. 5. Small left pleural effusion and minimal right pleural effusion. 6. Cholelithiasis. 7. Tiny bilateral renal calculi. 8. Probable subcentimeter exophytic left renal cyst, difficult to assess due to streak artifacts produced by the patient's left arm. This could be assessed with renal ultrasound. Electronically Signed   By: Catherin Closs M.D.   On: 07/08/2023 16:07   DG CHEST PORT 1 VIEW Result Date: 07/08/2023 CLINICAL DATA:  [id number] Endotracheally intubated [id number] EXAM: PORTABLE CHEST 1 VIEW COMPARISON:  07/08/2023 FINDINGS: The endotracheal tube is positioned just to the left of midline, likely within the mid trachea, likely due to patient rotation. Mild cardiomegaly. No focal airspace consolidation, pleural effusion, or pneumothorax. No cardiomegaly. No acute fracture or destructive lesion. Moderate osteoarthritis of the right shoulder. Multilevel degenerative disc disease of the spine. Weighted feeding tube courses below the diaphragm with the distal tip not included in the field of view. IMPRESSION: 1. The endotracheal tube is positioned just to the left of midline, likely within the mid trachea, but abnormally positioned due to patient rotation. No pneumothorax. 2. Weighted feeding tube courses below the diaphragm, distal tip outside the field of view. Electronically Signed   By: Rance Burrows M.D.   On: 07/08/2023 14:02   DG CHEST PORT 1 VIEW Result Date:  07/08/2023 CLINICAL DATA:  Dyspnea. EXAM: PORTABLE CHEST 1 VIEW COMPARISON:  07/06/2023 FINDINGS: Feeding tube extends down  the esophagus into the stomach. Tube appears to be coiled in the stomach but the tip is not clearly identified. Heart size is within normal limits and stable. Prominent vascular and interstitial lung markings are unchanged. No focal airspace disease or lung consolidation. Again noted is severe joint space narrowing and probable degenerative changes in both shoulders. Trachea is midline. IMPRESSION: 1. Again noted are prominent vascular and interstitial lung markings. Findings could represent vascular congestion or mild edema. Minimal change since 07/06/2023. 2. Feeding tube extends into the abdomen. Electronically Signed   By: Elene Griffes M.D.   On: 07/08/2023 10:25   DG Abd 1 View Result Date: 07/07/2023 CLINICAL DATA:  Nasogastric tube placement. EXAM: ABDOMEN - 1 VIEW COMPARISON:  None Available. FINDINGS: Weighted enteric tube tip in the left upper abdomen in the region of the mid or distal stomach. No bowel dilatation in the included upper abdomen. IMPRESSION: Weighted enteric tube tip in the mid or distal stomach. Electronically Signed   By: Chadwick Colonel M.D.   On: 07/07/2023 17:08   DG Fluoro Rm 1-60 Min - No Report Result Date: 07/07/2023 CLINICAL DATA:  Acute encephalopathy, nasogastric tube placement requested. EXAM: FLUORO RM 1-60 MIN-NO REPORT FLUOROSCOPY: Fluoroscopy Time:  0 minutes 48 seconds Radiation Exposure Index (if provided by the fluoroscopic device): 7.0 mGy Number of Acquired Spot Images: 1 COMPARISON:  None Available. FINDINGS: Multiple attempts at nasogastric tube placement with a 20 French catheter resulted in right or left mainstem bronchus positioning. Patient was unable to swallow or follow directions to aid with insertion. Study was terminated. IMPRESSION: Unsuccessful nasogastric tube placement under fluoroscopy. Electronically Signed   By: Shearon Denis M.D.   On: 07/07/2023 14:13   ECHOCARDIOGRAM COMPLETE Result Date: 07/07/2023    ECHOCARDIOGRAM REPORT   Patient Name:   Mir Amber. Date of Exam: 07/07/2023 Medical Rec #:  [id number]          Height:       74.0 in Accession #:    [id number]         Weight:       236.6 lb Date of Birth:  1966-03-04          BSA:          2.334 m Patient Age:    58 years           BP:           141/88 mmHg Patient Gender: M                  HR:           110 bpm. Exam Location:  Inpatient Procedure: 3D Echo, Cardiac Doppler, Color Doppler and Intracardiac            Opacification Agent (Both Spectral and Color Flow Doppler were            utilized during procedure). Indications:    Atrial fibrillation  History:        Patient has no prior history of Echocardiogram examinations.  Sonographer:    Juanita Shaw Referring Phys: [id number] PARKER  Sonographer Comments: Image acquisition challenging due to patient body habitus and Image acquisition challenging due to respiratory motion. IMPRESSIONS  1. Left ventricular ejection fraction, by estimation, is 65 to 70%. The left ventricle has normal function. The left ventricle has no regional wall motion abnormalities. Left ventricular diastolic parameters are indeterminate.  2. Right ventricular systolic function is normal. The right  ventricular size is normal. There is normal pulmonary artery systolic pressure. The estimated right ventricular systolic pressure is 35.8 mmHg.  3. Left atrial size was moderately dilated.  4. The mitral valve is normal in structure. No evidence of mitral valve regurgitation. No evidence of mitral stenosis.  5. The aortic valve is tricuspid. Aortic valve regurgitation is not visualized. Aortic valve sclerosis/calcification is present, without any evidence of aortic stenosis. Aortic valve mean gradient measures 8.0 mmHg.  6. Aortic dilatation noted. There is mild dilatation of the aortic root, measuring 39 mm.  7. The inferior vena cava is  dilated in size with <50% respiratory variability, suggesting right atrial pressure of 15 mmHg.  8. The patient was in atrial fibrillation. FINDINGS  Left Ventricle: Left ventricular ejection fraction, by estimation, is 65 to 70%. The left ventricle has normal function. The left ventricle has no regional wall motion abnormalities. The left ventricular internal cavity size was normal in size. There is  no left ventricular hypertrophy. Left ventricular diastolic parameters are indeterminate. Right Ventricle: The right ventricular size is normal. No increase in right ventricular wall thickness. Right ventricular systolic function is normal. There is normal pulmonary artery systolic pressure. The tricuspid regurgitant velocity is 2.28 m/s, and  with an assumed right atrial pressure of 15 mmHg, the estimated right ventricular systolic pressure is 35.8 mmHg. Left Atrium: Left atrial size was moderately dilated. Right Atrium: Right atrial size was normal in size. Pericardium: There is no evidence of pericardial effusion. Mitral Valve: The mitral valve is normal in structure. No evidence of mitral valve regurgitation. No evidence of mitral valve stenosis. MV peak gradient, 5.4 mmHg. The mean mitral valve gradient is 2.0 mmHg. Tricuspid Valve: The tricuspid valve is normal in structure. Tricuspid valve regurgitation is trivial. Aortic Valve: The aortic valve is tricuspid. Aortic valve regurgitation is not visualized. Aortic valve sclerosis/calcification is present, without any evidence of aortic stenosis. Aortic valve mean gradient measures 8.0 mmHg. Aortic valve peak gradient measures 16.0 mmHg. Aortic valve area, by VTI measures 2.25 cm. Pulmonic Valve: The pulmonic valve was normal in structure. Pulmonic valve regurgitation is not visualized. Aorta: Aortic dilatation noted. There is mild dilatation of the aortic root, measuring 39 mm. Venous: The inferior vena cava is dilated in size with less than 50% respiratory  variability, suggesting right atrial pressure of 15 mmHg. IAS/Shunts: No atrial level shunt detected by color flow Doppler.  LEFT VENTRICLE PLAX 2D LVIDd:         5.00 cm      Diastology LVIDs:         3.60 cm      LV e' medial:    7.94 cm/s LV PW:         0.60 cm      LV E/e' medial:  17.0 LV IVS:        0.90 cm      LV e' lateral:   12.90 cm/s LVOT diam:     2.00 cm      LV E/e' lateral: 10.5 LV SV:         56 LV SV Index:   24 LVOT Area:     3.14 cm  LV Volumes (MOD) LV vol d, MOD A4C: 170.0 ml LV vol s, MOD A4C: 50.6 ml LV SV MOD A4C:     170.0 ml RIGHT VENTRICLE             IVC RV Basal diam:  4.20 cm  IVC diam: 2.50 cm RV Mid diam:    2.80 cm RV S prime:     22.40 cm/s TAPSE (M-mode): 2.0 cm LEFT ATRIUM              Index        RIGHT ATRIUM           Index LA diam:        4.20 cm  1.80 cm/m   RA Area:     17.80 cm LA Vol (A2C):   106.0 ml 45.41 ml/m  RA Volume:   43.80 ml  18.76 ml/m LA Vol (A4C):   86.7 ml  37.14 ml/m LA Biplane Vol: 101.0 ml 43.27 ml/m  AORTIC VALVE                     PULMONIC VALVE AV Area (Vmax):    2.04 cm      PV Vmax:       1.42 m/s AV Area (Vmean):   2.04 cm      PV Peak grad:  8.1 mmHg AV Area (VTI):     2.25 cm AV Vmax:           200.00 cm/s AV Vmean:          125.000 cm/s AV VTI:            0.248 m AV Peak Grad:      16.0 mmHg AV Mean Grad:      8.0 mmHg LVOT Vmax:         130.00 cm/s LVOT Vmean:        81.200 cm/s LVOT VTI:          0.178 m LVOT/AV VTI ratio: 0.72  AORTA Ao Root diam: 3.90 cm Ao Asc diam:  3.30 cm MITRAL VALVE                TRICUSPID VALVE MV Area (PHT): 5.70 cm     TR Peak grad:   20.8 mmHg MV Area VTI:   2.31 cm     TR Vmax:        228.00 cm/s MV Peak grad:  5.4 mmHg MV Mean grad:  2.0 mmHg     SHUNTS MV Vmax:       1.16 m/s     Systemic VTI:  0.18 m MV Vmean:      65.4 cm/s    Systemic Diam: 2.00 cm MV Decel Time: 133 msec MV E velocity: 135.00 cm/s Dalton McleanMD Electronically signed by Archer Bear Signature Date/Time: 07/07/2023/9:53:50  AM    Final    DG CHEST PORT 1 VIEW Result Date: 07/06/2023 CLINICAL DATA:  Weakness altered EXAM: PORTABLE CHEST 1 VIEW COMPARISON:  07/05/2023 FINDINGS: Hypoventilatory changes. Cardiomegaly with mild central congestion. No consolidation, pleural effusion, or pneumothorax IMPRESSION: Cardiomegaly with mild central congestion. Electronically Signed   By: Esmeralda Hedge M.D.   On: 07/06/2023 18:17   DG CHEST PORT 1 VIEW Result Date: 07/05/2023 CLINICAL DATA:  Dyspnea EXAM: PORTABLE CHEST 1 VIEW COMPARISON:  07/04/2023 FINDINGS: Hypoventilatory change. Cardiomegaly with mild central congestion. No pleural effusion or pneumothorax. Advanced degenerative change of the shoulders. IMPRESSION: Cardiomegaly with mild central congestion. Electronically Signed   By: Esmeralda Hedge M.D.   On: 07/05/2023 16:06   DG CHEST PORT 1 VIEW Result Date: 07/04/2023 CLINICAL DATA:  Hypertension and dyspnea, generalized weakness, AKI EXAM: PORTABLE CHEST 1 VIEW COMPARISON:  None are available. There is a report from radiographs  12/14/2018 FINDINGS: Borderline cardiomegaly. Diffuse interstitial coarsening and airspace opacities bilaterally. No pleural effusion or pneumothorax. No displaced rib fractures. Advanced arthritis both shoulders. IMPRESSION: Diffuse interstitial coarsening and airspace opacities bilaterally, likely edema. Atypical infection could appear similarly. Electronically Signed   By: Rozell Cornet M.D.   On: 07/04/2023 22:21   CT BONE MARROW BIOPSY & ASPIRATION Result Date: 07/04/2023 INDICATION: [id number] Myeloma (HCC) [id number] EXAM: CT GUIDED BONE MARROW ASPIRATION AND CORE BIOPSY MEDICATIONS: None. ANESTHESIA/SEDATION: Moderate (conscious) sedation was employed during this procedure. A total of Versed  2 mg and Fentanyl  50 mcg was administered intravenously. Moderate Sedation Time: 16 minutes. The patient's level of consciousness and vital signs were monitored continuously by radiology nursing throughout the  procedure under my direct supervision. FLUOROSCOPY TIME:  CT dose; 314 mGycm COMPLICATIONS: None immediate. Estimated blood loss: <5 mL PROCEDURE: RADIATION DOSE REDUCTION: This exam was performed according to the departmental dose-optimization program which includes automated exposure control, adjustment of the mA and/or kV according to patient size and/or use of iterative reconstruction technique. Informed written consent was obtained from the patient and/or patient's representative after a thorough discussion of the procedural risks, benefits and alternatives. All questions were addressed. Maximal Sterile Barrier Technique was utilized including caps, mask, sterile gowns, sterile gloves, sterile drape, hand hygiene and skin antiseptic. A timeout was performed prior to the initiation of the procedure. The patient was positioned prone and non-contrast localization CT was performed of the pelvis to demonstrate the iliac marrow spaces. Under sterile conditions and local anesthesia, an 11 gauge coaxial bone biopsy needle was advanced into the RIGHT iliac marrow space. Needle position was confirmed with CT imaging. Initially, bone marrow aspiration was performed. Next, the 11 gauge outer cannula was utilized to obtain a 1 iliac bone marrow core biopsy. Needle was removed. Hemostasis was obtained with compression. The patient tolerated the procedure well. Samples were prepared with the cytotechnologist. IMPRESSION: Successful CT-guided bone marrow aspiration and biopsy. Art Largo, MD [facility] Electronically Signed   By: Art Largo M.D.   On: 07/04/2023 12:21   ADDENDUM: I saw and examined Mr. Odoms.  I am still not sure as to why he is having these temperatures.  His myeloma levels have improved drastically.  He is not hypercalcemic.  He is still getting dialysis.  Cultures have all been taken.  He is on broad-spectrum antibiotic coverage.  His labs  today show white cell count of 25.9.  Hemoglobin 8.1.  Platelet count 231,000.  He is off Neupogen .  He had been on Epogen .  Again I just have never seen nplate with myeloma have spiking temperatures less a had an obvious infection.  Again his total protein is only 7.9.  His albumin  is less than 1.5.  I still believe that there is an issue with his liver given the incredibly low albumin .  The other thing that might be a possibility is light chain disease.  However, we have checked him for this.  He does have elevated kappa light chain of 13.8 mg/dL.  I know he is gotten IVIG to try help with his immune system.  I want to hold on further chemotherapy for right now.  Again he had a very nice response by decreasing the IgG level.  I know that he has been evaluated by multiple specialists.  He is getting tube feeds which certainly are helping.  We will continue to monitor.  I just feel we can hold on chemotherapy right  now.  I know that he will continue to get incredible care from everybody in the ICU at [facility].   [name], MD  [place]ston Cornea 1:5

## 2023-07-28 NOTE — Progress Notes (Signed)
 CPT held at this time. Dialysis team at bedside.

## 2023-07-28 NOTE — Progress Notes (Signed)
 [facility] NEPHROLOGY PROGRESS NOTE   Subjective:  Seen in ICU Remains off pressors   Objective Vital signs in last 24 hours: Vitals:   07/26/23 1100 07/26/23 1125 07/26/23 1130 07/26/23 1200  BP: (!) 179/96 (!) 146/85 115/72 (!) 83/50  Pulse: (!) 105 96 93 76  Resp: 16 20 20 20   Temp:    99.8 F (37.7 C)  TempSrc:    Axillary  SpO2: 100% 98% 98% 99%  Weight:      Height:        Physical Exam: General: Critically ill looking male, intubated Heart:RRR, s1s2 nl Lungs: Coarse breath sound bilateral on vent Abdomen:soft, Non-tender, non-distended Extremities:No edema, LUE more tense now with 1+ pitting edema, R trace  Neurology: not responsive, on vent     Assessment/ Plan: Pt is a 58 y.o. yo male with past medical history significant for HTN, HLD, type II DM, BPH, seizure who was initially presented at [facility] due to AMS, admitted for sepsis due to pneumonia, encephalopathy, sepsis, AKI, hypercalcemia symptomatic anemia and Lytic pulm lesion concern for multiple myeloma.  Problems # AKI on CKD 3a - b/l creat 1.1- 1.5 from 2024, eGFR 53- >60 ml/min. Creat here 5.8 on presentation at OSH. AKI due to myeloma kidney +/- hyperCa +/- ACEi (home med). Started CRRT on 4/23. Pt remains anuric, and tolerating CRRT.  No further clotting after circuit heparin  increased 5/6.  Pressors weaned off. Remains hemodynamically stable. CRRT dc'd 5/10 and we are planning regular iHD sometime today/ tonight.   # AMS/acute metabolic encephalopathy: per primary, seems to be protracted.  MRI, LP completed and unrevealing.   #Volume - appearing euvolemic now, and wt's are rising some. IV drips are minimal w/ fentanyl  at 20 cc/ hr and TF's at 50 cc/hr.   # Shock- off pressors, cont midodrine  10 tid now.   # Multiple myeloma: Oncologist started Velcade  and Cytoxan , per oncology team.   On hold for now given ongoing critical illness.  #AHRF: s/p trach 5/8, per CCM  #  Atrial fib-  on amio  #Anemia - prn transfusions. On epogen  per onc.     Frank Poag  MD  [facility] 07/26/2023, 1:31 PM  Recent Labs  Lab 07/24/23 0421 07/24/23 1630 07/25/23 1508 07/26/23 0423  HGB 8.9*  --   --  8.9*  ALBUMIN  1.6*   < > 1.6* 1.8*  1.6*  CALCIUM  7.4*   < > 7.7* 7.7*  7.7*  PHOS 2.0*  1.5*   < > RESULTS UNAVAILABLE DUE TO INTERFERING SUBSTANCE 1.9*  1.5*  CREATININE 1.59*   < > 1.62* 1.77*  1.74*  K 4.3   < > 4.6 4.4  4.5   < > = values in this interval not displayed.    Inpatient medications:  amiodarone   200 mg Per NG tube Daily   arformoterol   15 mcg Nebulization BID   busPIRone   15 mg Per Tube QHS   clonazePAM   1 mg Per Tube BID   docusate  100 mg Per Tube BID   epoetin  alfa  40,000 Units Subcutaneous QODAY   famotidine   10.4 mg Per Tube Daily   feeding supplement (PROSource TF20)  60 mL Per Tube TID   folic acid   1 mg Per Tube Daily   gabapentin   100 mg Per Tube Q12H   heparin  injection (subcutaneous)  5,000 Units Subcutaneous Q8H   insulin  aspart  0-20 Units Subcutaneous Q4H   insulin  aspart  8 Units  Subcutaneous Q4H   insulin  glargine-yfgn  10 Units Subcutaneous BID   lactulose   20 g Per Tube TID   leptospermum manuka honey  1 Application Topical Daily   lidocaine -EPINEPHrine   20 mL Intradermal Once   midodrine   10 mg Per Tube TID WC   nicotine   7 mg Transdermal Daily   mouth rinse  15 mL Mouth Rinse Q2H   PARoxetine   40 mg Per Tube Daily   pneumococcal 20-valent conjugate vaccine  0.5 mL Intramuscular Tomorrow-1000   polyethylene glycol  17 g Per Tube Daily   QUEtiapine   50 mg Per Tube QHS   revefenacin   175 mcg Nebulization Daily   sodium chloride  flush  10-40 mL Intracatheter Q12H   sodium chloride  HYPERTONIC  4 mL Nebulization Daily   thiamine   100 mg Per Tube Daily    ampicillin-sulbactam (UNASYN) IV Stopped (07/26/23 0454)   dexmedetomidine  (PRECEDEX ) IV infusion 1.2 mcg/kg/hr (07/26/23 1300)   feeding supplement (VITAL 1.5 CAL) 60  mL/hr at 07/26/23 1300   fentaNYL  infusion INTRAVENOUS 200 mcg/hr (07/26/23 1300)   levofloxacin  (LEVAQUIN ) IV 100 mL/hr at 07/26/23 1300   midazolam      norepinephrine  (LEVOPHED ) Adult infusion 2 mcg/min (07/26/23 1300)   sodium PHOSPHATE  IVPB (in mmol)     voriconazole  Stopped (07/26/23 1225)   Followed by   [name] ON 07/27/2023] voriconazole      acetaminophen  (TYLENOL ) oral liquid 160 mg/5 mL **OR** acetaminophen , fentaNYL , ipratropium-albuterol , midazolam , ondansetron  (ZOFRAN ) IV, mouth rinse, oxyCODONE , polyethylene glycol, polyvinyl alcohol , sodium chloride  flush

## 2023-07-29 DIAGNOSIS — Z7189 Other specified counseling: Secondary | ICD-10-CM | POA: Diagnosis not present

## 2023-07-29 DIAGNOSIS — B440 Invasive pulmonary aspergillosis: Secondary | ICD-10-CM | POA: Diagnosis not present

## 2023-07-29 DIAGNOSIS — C9000 Multiple myeloma not having achieved remission: Secondary | ICD-10-CM | POA: Diagnosis not present

## 2023-07-29 DIAGNOSIS — B441 Other pulmonary aspergillosis: Secondary | ICD-10-CM | POA: Diagnosis not present

## 2023-07-29 DIAGNOSIS — D72829 Elevated white blood cell count, unspecified: Secondary | ICD-10-CM | POA: Diagnosis not present

## 2023-07-29 DIAGNOSIS — N179 Acute kidney failure, unspecified: Secondary | ICD-10-CM | POA: Diagnosis not present

## 2023-07-29 DIAGNOSIS — J9601 Acute respiratory failure with hypoxia: Secondary | ICD-10-CM | POA: Diagnosis not present

## 2023-07-29 DIAGNOSIS — J168 Pneumonia due to other specified infectious organisms: Secondary | ICD-10-CM | POA: Diagnosis not present

## 2023-07-29 DIAGNOSIS — Z87891 Personal history of nicotine dependence: Secondary | ICD-10-CM | POA: Diagnosis not present

## 2023-07-29 DIAGNOSIS — F17210 Nicotine dependence, cigarettes, uncomplicated: Secondary | ICD-10-CM | POA: Diagnosis not present

## 2023-07-29 DIAGNOSIS — E43 Unspecified severe protein-calorie malnutrition: Secondary | ICD-10-CM | POA: Insufficient documentation

## 2023-07-29 DIAGNOSIS — Z515 Encounter for palliative care: Secondary | ICD-10-CM

## 2023-07-29 DIAGNOSIS — J90 Pleural effusion, not elsewhere classified: Secondary | ICD-10-CM | POA: Diagnosis not present

## 2023-07-29 LAB — CBC WITH DIFFERENTIAL/PLATELET
Abs Immature Granulocytes: 1.8 10*3/uL — ABNORMAL HIGH (ref 0.00–0.07)
Basophils Absolute: 0.2 10*3/uL — ABNORMAL HIGH (ref 0.0–0.1)
Basophils Relative: 1 %
Blasts: 2 %
Eosinophils Absolute: 0.3 10*3/uL (ref 0.0–0.5)
Eosinophils Relative: 2 %
HCT: 26.4 % — ABNORMAL LOW (ref 39.0–52.0)
Hemoglobin: 8.1 g/dL — ABNORMAL LOW (ref 13.0–17.0)
Lymphocytes Relative: 5 %
Lymphs Abs: 0.8 10*3/uL (ref 0.7–4.0)
MCH: 30.8 pg (ref 26.0–34.0)
MCHC: 30.7 g/dL (ref 30.0–36.0)
MCV: 100.4 fL — ABNORMAL HIGH (ref 80.0–100.0)
Metamyelocytes Relative: 3 %
Monocytes Absolute: 0.8 10*3/uL (ref 0.1–1.0)
Monocytes Relative: 5 %
Myelocytes: 4 %
Neutro Abs: 11.8 10*3/uL — ABNORMAL HIGH (ref 1.7–7.7)
Neutrophils Relative %: 74 %
Platelets: 249 10*3/uL (ref 150–400)
Promyelocytes Relative: 4 %
RBC: 2.63 MIL/uL — ABNORMAL LOW (ref 4.22–5.81)
RDW: 20.5 % — ABNORMAL HIGH (ref 11.5–15.5)
WBC: 16 10*3/uL — ABNORMAL HIGH (ref 4.0–10.5)
nRBC: 12 % — ABNORMAL HIGH (ref 0.0–0.2)
nRBC: 6 /100{WBCs} — ABNORMAL HIGH

## 2023-07-29 LAB — COMPREHENSIVE METABOLIC PANEL WITH GFR
ALT: 11 U/L (ref 0–44)
AST: 17 U/L (ref 15–41)
Albumin: 1.6 g/dL — ABNORMAL LOW (ref 3.5–5.0)
Alkaline Phosphatase: 81 U/L (ref 38–126)
Anion gap: 10 (ref 5–15)
BUN: 54 mg/dL — ABNORMAL HIGH (ref 6–20)
CO2: 26 mmol/L (ref 22–32)
Calcium: 7.9 mg/dL — ABNORMAL LOW (ref 8.9–10.3)
Chloride: 99 mmol/L (ref 98–111)
Creatinine, Ser: 3.03 mg/dL — ABNORMAL HIGH (ref 0.61–1.24)
GFR, Estimated: 23 mL/min — ABNORMAL LOW (ref >60)
Glucose, Bld: 92 mg/dL (ref 70–99)
Potassium: 3.6 mmol/L (ref 3.5–5.1)
Sodium: 135 mmol/L (ref 135–145)
Total Bilirubin: 0.8 mg/dL (ref 0.0–1.2)
Total Protein: 7.7 g/dL (ref 6.5–8.1)

## 2023-07-29 LAB — GLUCOSE, CAPILLARY
Glucose-Capillary: 101 mg/dL — ABNORMAL HIGH (ref 70–99)
Glucose-Capillary: 102 mg/dL — ABNORMAL HIGH (ref 70–99)
Glucose-Capillary: 105 mg/dL — ABNORMAL HIGH (ref 70–99)
Glucose-Capillary: 86 mg/dL (ref 70–99)
Glucose-Capillary: 96 mg/dL (ref 70–99)
Glucose-Capillary: 96 mg/dL (ref 70–99)

## 2023-07-29 LAB — PHOSPHORUS
Phosphorus: >30 mg/dL — ABNORMAL HIGH (ref 2.5–4.6)
Phosphorus: >30 mg/dL — ABNORMAL HIGH (ref 2.5–4.6)

## 2023-07-29 LAB — ASPERGILLUS ANTIBODY BY IMMUNODIFF
Aspergillus flavus: NEGATIVE
Aspergillus fumigatus, IgG: NEGATIVE
Aspergillus niger: NEGATIVE

## 2023-07-29 LAB — RENAL FUNCTION PANEL
Albumin: 1.8 g/dL — ABNORMAL LOW (ref 3.5–5.0)
Anion gap: 15 (ref 5–15)
BUN: 69 mg/dL — ABNORMAL HIGH (ref 6–20)
CO2: 24 mmol/L (ref 22–32)
Calcium: 8.3 mg/dL — ABNORMAL LOW (ref 8.9–10.3)
Chloride: 100 mmol/L (ref 98–111)
Creatinine, Ser: 3.71 mg/dL — ABNORMAL HIGH (ref 0.61–1.24)
GFR, Estimated: 18 mL/min — ABNORMAL LOW (ref >60)
Glucose, Bld: 108 mg/dL — ABNORMAL HIGH (ref 70–99)
Phosphorus: >30 mg/dL — ABNORMAL HIGH (ref 2.5–4.6)
Potassium: 4.2 mmol/L (ref 3.5–5.1)
Sodium: 139 mmol/L (ref 135–145)

## 2023-07-29 LAB — URIC ACID: Uric Acid, Serum: 5.5 mg/dL (ref 3.7–8.6)

## 2023-07-29 LAB — HEPATITIS B SURFACE ANTIBODY, QUANTITATIVE: Hep B S AB Quant (Post): 162 m[IU]/mL

## 2023-07-29 MED ORDER — INSULIN GLARGINE-YFGN 100 UNIT/ML ~~LOC~~ SOLN
8.0000 [IU] | Freq: Two times a day (BID) | SUBCUTANEOUS | Status: DC
  Administered 2023-07-29 – 2023-08-13 (×30): 8 [IU] via SUBCUTANEOUS
  Filled 2023-07-29 (×33): qty 0.08

## 2023-07-29 MED ORDER — NEPRO/CARBSTEADY PO LIQD
1000.0000 mL | ORAL | Status: DC
  Administered 2023-07-29 – 2023-08-03 (×6): 1000 mL
  Filled 2023-07-29 (×2): qty 1000

## 2023-07-29 MED ORDER — JUVEN PO PACK
1.0000 | PACK | Freq: Two times a day (BID) | ORAL | Status: DC
  Administered 2023-07-29 – 2023-08-14 (×31): 1
  Filled 2023-07-29 (×33): qty 1

## 2023-07-29 MED ORDER — INSULIN ASPART 100 UNIT/ML IJ SOLN
6.0000 [IU] | INTRAMUSCULAR | Status: DC
  Administered 2023-07-29 – 2023-08-01 (×18): 6 [IU] via SUBCUTANEOUS

## 2023-07-29 MED ORDER — RENA-VITE PO TABS
1.0000 | ORAL_TABLET | Freq: Every day | ORAL | Status: DC
  Administered 2023-07-29 – 2023-09-10 (×42): 1
  Filled 2023-07-29 (×44): qty 1

## 2023-07-29 MED ORDER — PROSOURCE TF20 ENFIT COMPATIBL EN LIQD
60.0000 mL | Freq: Two times a day (BID) | ENTERAL | Status: DC
  Administered 2023-07-29 – 2023-08-14 (×33): 60 mL
  Filled 2023-07-29 (×34): qty 60

## 2023-07-29 MED ORDER — VANCOMYCIN HCL IN DEXTROSE 1-5 GM/200ML-% IV SOLN
1000.0000 mg | Freq: Once | INTRAVENOUS | Status: AC
  Administered 2023-07-29: 1000 mg via INTRAVENOUS
  Filled 2023-07-29: qty 200

## 2023-07-29 NOTE — Progress Notes (Signed)
 PT Cancellation Note  Patient Details Name: Frank [name]. MRN: [id number] DOB: [birth year]   Cancelled Treatment:    Reason Eval/Treat Not Completed: Patient not medically ready (pt intubated, sedated, not following commands and RN states they will attempt to wean sedation today for participation. Pt has been HTN and agitated with prior weans)   Nyomi Howser B Meya Clutter 07/29/2023, 9:32 AM Annis Baseman, PT Acute Rehabilitation Services Office: [phone]

## 2023-07-29 NOTE — Plan of Care (Signed)
  Problem: Education: Goal: Knowledge of General Education information will improve Description: Including pain rating scale, medication(s)/side effects and non-pharmacologic comfort measures Outcome: Progressing   Problem: Activity: Goal: Risk for activity intolerance will decrease Outcome: Progressing   Problem: Nutrition: Goal: Adequate nutrition will be maintained Outcome: Progressing   Problem: Elimination: Goal: Will not experience complications related to urinary retention Outcome: Progressing   Problem: Pain Managment: Goal: General experience of comfort will improve and/or be controlled Outcome: Progressing   Problem: Safety: Goal: Ability to remain free from injury will improve Outcome: Progressing   Problem: Skin Integrity: Goal: Risk for impaired skin integrity will decrease Outcome: Progressing

## 2023-07-29 NOTE — Progress Notes (Signed)
 Palliative:  HPI: 58 y.o. male  with past medical history of OSA on CPAP, GERD, HTN, HLD, DM2, BPH, seizure disorder admitted on 07/03/2023 as a transfer from [facility] health due to AMS. Workup revealed severe sepsis due to pneumonia and metabolic encephalopathy. Frank [name] also has been newly diagnosed with multiple myeloma this admission and treatment started with Velcade  and Cytoxan . Admission has been complicated by pancytopenias, septic shock due to pneumonia with respiratory failure requiring intubation (intubated on 4/22), kidney injury- now on CRRT, is anuric. Frank [name] is currently on vasopressor support.  Palliative medicine consulted for GOC.   I met today at [facility] bedside but no family or visitors present. Frank [name] appears restless and uncomfortable in bed. Frank [name] does not really engage or interact with me. Frank [name] does open his eyes and make eye contact. Frank [name] was interacting and following some simple commands and nodding head for RN this morning. Discussed with RN and Dr. Gaynell Keeler. Also completed thorough chart review.   I called and had discussion with wife, Frank [name]. 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 She has some knowledge of resuscitation and certain decisions she has been faced with. She shares with me that Frank [name] has had some odd side effects to certain medications in the past. Frank [name] was recently placed on Wellbutrin - she was unsure why and Frank [name] had a tremor and memory issues so they d/c Wellbutrin and she reports no further memory issues but tremor remained. Frank [name] was using a cane due to tremor. Frank [name] has had complicated past history with remote history of ETOH and drug use but this was many years ago and when Frank [name] was diagnosed with bipolar and placed on medication Frank [name] no longer used drugs and does not drink ETOH routinely - Frank [name] may have a couple drinks a month. Frank [name] has also had issues with low back pain and has had muscle relaxer and steroid courses previously but Frank [name] reports this has been improved lately.   We discussed goals  of care. Frank [name] continues to endorse full code, full scope. She understands that Frank [name] is dialysis dependent and is hoping Frank [name] can make some progress on ventilator. She shares that their daughter is getting married Nov 15th and they have a grandson on the way as well. Frank [name] has a grandson, Cleora Daft, that is his pride and joy. Frank [name] reports that it is very important that Frank [name] is here to see his daughter married and meet his new grandson. Frank [name] shares that she has discussed resuscitation with their daughter and they do desire full code and would like 3 rounds of ACLS. Frank [name] alludes to his poor prognosis but shares that family are "not ready to go there" until they have too.   Frank [name] shares that Frank [name] enjoys television: Melodie Spry, Young & the Restless, food network, and cartoons such as Scooby-doo. Frank [name] also likes country music. Frank [name] has 2 children and 4 grandchildren from previous marriage but unfortunately they are not part of Larnie's life.   All questions/concerns addressed. Emotional support provided.   Exam: Alert, follows commands inconsistently. Eyes open and tracking. Appears tense and uncomfortable. HR tachy. Trach to vent - no distress. Warm to touch. Feet cool to touch.   Plan: - Full code, full scope - Recommend to restart divalproex  500 mg TID - Palliative will follow along and will check in periodically - please call for any changes of acute palliative needs  70 min  Vila Grayer, NP Palliative Medicine Team Pager [phone] (Please see [email or web address] for schedule) Team Phone [phone]

## 2023-07-29 NOTE — Progress Notes (Signed)
 Nutrition Follow-up  DOCUMENTATION CODES:  Severe malnutrition in context of acute illness/injury  INTERVENTION:  Continue tube feeding via cortrak: Nepro at 55 ml/h (1320 ml per day) Prosource TF20 60 ml BID Provides 2516 kcal, 147 gm protein, 960 ml free water  daily 1 packet Juven BID, each packet provides 95 calories, 2.5 grams of protein (collagen) + micronutrients to support wound healing Renal MVI  NUTRITION DIAGNOSIS:  Severe Malnutrition related to acute illness (prolonged illness, interuptions in feeding) as evidenced by moderate fat depletion, moderate muscle depletion, percent weight loss (9.6% x 1 month). - new dx established 5/13  GOAL:  Patient will meet greater than or equal to 90% of their needs - progressing  MONITOR:  Vent status, Labs, Weight trends, TF tolerance  REASON FOR ASSESSMENT:  Consult Assessment of nutrition requirement/status  ASSESSMENT:  58 y.o. male with PMH of obesity, asthma, OSA on CPAP, GERD, HTN, HLD who presented due to feeling weak with AMS for a few days. Admitted with concern for sepsis, community-acquired pneumonia, encephalopathy as well as concern for multiple myeloma.  4/17 Admit to [facility] 4/21 SLP eval - NPO; IR attempted Cortrak but unable to be placed; CCM MD successfully placed NGT 4/22 Concern for aspiration overnight; Intubated 4/23 - CRRT 5/8 - tracheostomy 5/10 - CRRT discontinued 5/11 - transferred to [facility] 5/12 - iHD initiated   Patient is currently intubated via trach. No family at bedside this AM to provide a hx. Pt not very interactive. Noted small bore NGT in place from [facility], tape securing to nose. Discussed with CCM, ok to place bridle for more security for tube and easier to clean. Tube was clipped at original marking of 75cm at the right nare, pt tolerated well.   On exam, pt with muscle and fat depletions. Compared to exam done earlier in admission, depletions worse. Meets criteria for  malnutrition. Pt also has worsening wounds. Increased estimated nutrition needs and adjusted TF rate slightly. Will also provide juven to support wound healing.   Pt with liquid stool in rectal pouch. Pt on lactulose , antibiotics, and chemo. Likely multifactorial. Will discuss with RPH. Pt's TF is high in fiber, receiving 33g at current infusion rate.  MV: 11.8 L/min Temp (24hrs), Avg:100 F (37.8 C), Min:98.2 F (36.8 C), Max:101.3 F (38.5 C) MAP (cuff):  Admit weight: 110.6 kg (4/17) Current weight: 100 kg (5/13) 9.6% weight loss x ~1 month since admission on   Intake/Output Summary (Last 24 hours) at 07/29/2023 1433 Last data filed at 07/29/2023 1400 Gross per 24 hour  Intake 2466.64 ml  Output 3600 ml  Net -1133.36 ml  Net IO Since Admission: 10,560.56 mL [07/29/23 1433] UOP 0mL x 24 hours  Drains/Lines: Small bore tube, 10 Fr. Temporary HD catheter, triple lumen IJ FMS x 24 hours Tracheostomy   Nutritionally Relevant Medications: Scheduled Meds:  docusate  100 mg Per Tube BID   famotidine   10.4 mg Per Tube Daily   PROSource TF20  60 mL Per Tube Daily   folic acid   1 mg Per Tube Daily   insulin  aspart  0-20 Units Subcutaneous Q4H   insulin  aspart  8 Units Subcutaneous Q4H   insulin  glargine-yfgn  10 Units Subcutaneous BID   lactulose   20 g Per Tube TID   polyethylene glycol  17 g Per Tube Daily   thiamine   100 mg Per Tube Daily   vancomycin     Does not apply See admin instructions   Continuous Infusions:  NEPRO CARB STEADY 50 mL/hr at 07/29/23 0800   norepinephrine  (LEVOPHED ) 2 mcg/min (07/29/23 0800)   vancomycin  1,000 mg (07/29/23 0836)   voriconazole  Stopped (07/29/23 0010)   PRN Meds: ondansetron , polyethylene glycol  Labs Reviewed: BUN 54, creatinine 3.03 CBG ranges from 95-108 mg/dL over the last 24 hours HgbA1c 6.7% (01/2023)  NUTRITION - FOCUSED PHYSICAL EXAM: Flowsheet Row Most Recent Value  Orbital Region Moderate depletion  Upper  Arm Region No depletion  Thoracic and Lumbar Region No depletion  Buccal Region Moderate depletion  Temple Region Moderate depletion  Clavicle Bone Region No depletion  Clavicle and Acromion Bone Region Mild depletion  Scapular Bone Region Mild depletion  Dorsal Hand Moderate depletion  Patellar Region Moderate depletion  Anterior Thigh Region Moderate depletion  Posterior Calf Region Moderate depletion  Edema (RD Assessment) None  Hair Reviewed  Eyes Unable to assess  Mouth Unable to assess  Skin Reviewed  Nails Reviewed    Diet Order:   Diet Order             Diet NPO time specified  Diet effective midnight                   EDUCATION NEEDS:  Not appropriate for education at this time  Skin: Skin Integrity Issues: Unstageable:  - Left and Right Heel - Left Buttocks (8 x 6 x 0.1 cm )  Last BM:  5/13 - type 7 FMS placed on 4/20  Height:  Ht Readings from Last 1 Encounters:  07/22/23 6' 0.01" (1.829 m)   Weight:  Wt Readings from Last 1 Encounters:  07/29/23 100 kg    Ideal Body Weight:  80.9 kg  BMI:  Body mass index is 29.89 kg/m.  Estimated Nutritional Needs:  Kcal:  2400-2700 kcal/d Protein:  145-160g/d Fluid:  2.4-2.5L/d    Edwena Graham, RD, LDN Registered Dietitian II Please reach out via secure chat

## 2023-07-29 NOTE — Progress Notes (Signed)
 [facility]    Date of Admission:  07/03/2023      ID: Frank [name]. is a 58 y.o. male with  Principal Problem:   Generalized weakness Active Problems:   Multiple myeloma not having achieved remission (HCC)   Atrial fibrillation with rapid ventricular response (HCC)   AKI (acute kidney injury) (HCC)   Acute respiratory failure with hypoxia (HCC)   PAF (paroxysmal atrial fibrillation) (HCC)   Acute encephalopathy   Severe sepsis with septic shock (HCC)   On mechanically assisted ventilation (HCC)   Acute metabolic encephalopathy   Lobar pneumonia, unspecified organism (HCC)   Multiple myeloma without remission (HCC)   Protein-calorie malnutrition, severe    Subjective: Still febrile, remains intubated  Wbc trending down from 26 to 16. Trach aspirate cx showing MRSE and he was started on vancomycin  yesterday  Medications:   amiodarone   200 mg Per NG tube Daily   arformoterol   15 mcg Nebulization BID   busPIRone   15 mg Per Tube QHS   clonazePAM   2 mg Per Tube TID   docusate  100 mg Per Tube BID   famotidine   10.4 mg Per Tube Daily   feeding supplement (PROSource TF20)  60 mL Per Tube BID   folic acid   1 mg Per Tube Daily   gabapentin   100 mg Per Tube Q12H   heparin  injection (subcutaneous)  5,000 Units Subcutaneous Q8H   heparin  sodium (porcine)  2,400 Units Intracatheter Once   insulin  aspart  0-20 Units Subcutaneous Q4H   insulin  aspart  6 Units Subcutaneous Q4H   insulin  glargine-yfgn  8 Units Subcutaneous BID   lactulose   20 g Per Tube TID   leptospermum manuka honey  1 Application Topical Daily   lidocaine -EPINEPHrine   20 mL Intradermal Once   midodrine   10 mg Per Tube TID WC   multivitamin  1 tablet Per Tube QHS   naproxen   250 mg Per Tube BID WC   nicotine   7 mg Transdermal Daily   nutrition supplement (JUVEN)  1 packet Per Tube BID BM   mouth rinse  15 mL Mouth Rinse Q2H   oxyCODONE   5 mg Per Tube Q6H   PARoxetine   40 mg  Per Tube Daily   pneumococcal 20-valent conjugate vaccine  0.5 mL Intramuscular Tomorrow-1000   polyethylene glycol  17 g Per Tube Daily   QUEtiapine   50 mg Per Tube QHS   revefenacin   175 mcg Nebulization Daily   sodium chloride  flush  10-40 mL Intracatheter Q12H   thiamine   100 mg Per Tube Daily   vancomycin  variable dose per unstable renal function (pharmacist dosing)   Does not apply See admin instructions    Objective: Vital signs in last 24 hours: Temp:  [98.2 F (36.8 C)-101.8 F (38.8 C)] 101.8 F (38.8 C) (05/13 1700) Pulse Rate:  [62-117] 101 (05/13 1700) Resp:  [11-26] 19 (05/13 1700) BP: (74-176)/(51-147) 148/105 (05/13 1700) SpO2:  [91 %-100 %] 94 % (05/13 1700) FiO2 (%):  [30 %] 30 % (05/13 1650) Weight:  [100 kg] 100 kg (05/13 0500)  Physical Exam  Constitutional: He is sedated.  Mouth/Throat: Oropharynx is clear and moist. No oropharyngeal exudate.  Cardiovascular: Normal rate, regular rhythm and normal heart sounds. Exam reveals no gallop and no friction rub.  No murmur heard.  Pulmonary/Chest: Effort normal and breath sounds normal. No respiratory distress. He has no wheezes.  Abdominal: Soft. Bowel sounds are decreased. He exhibits no  distension. There is no tenderness.  Skin: Skin is warm and dry. No rash noted. No erythema.  Psychiatric: He has a normal mood and affect. His behavior is normal.    Lab Results Recent Labs    07/28/23 0406 07/29/23 0446  WBC 25.9* 16.0*  HGB 8.1* 8.1*  HCT 26.9* 26.4*  NA 139 135  K 5.2* 3.6  CL 105 99  CO2 21* 26  BUN 90* 54*  CREATININE 4.11* 3.03*   Liver Panel Recent Labs    07/28/23 0406 07/29/23 0446  PROT 7.9 7.7  ALBUMIN  <1.5* 1.6*  AST 11* 17  ALT 11 11  ALKPHOS 95 81  BILITOT 0.7 0.8    Microbiology: reviewed Studies/Results: No results found.   Assessment/Plan: Invasive pulmonary aspergillosis pneumonia = continue on voriconazole . Possibly may have secondary bacterial infection.  Continue on vancomycin  for time being  Fevers = still suspect it is due to MM  Aki on CKD 3= off of pressors presently. Continue to monitor output. Will dose adjust abtx.  [facility] Pager: [phone]  07/29/2023, 5:23 PM

## 2023-07-29 NOTE — Consult Note (Signed)
 WOC Nurse wound follow up  Refer to previous WOC note dated 5/9, DTPI is now evolved into an unsteageable wound  Wound type: Unsteageable wound Measurement: 8cmx 6 cm x 0.1 cm Wound bed: covered in yellow slough Drainage  see nursing flowsheet  Periwound: erythema Dressing procedure/placement/frequency: Continue current orders:  Cleanse sacral/buttocks wound with Vashe wound cleanser Timm Foot [phone]) do not rinse and allow to air dry. Apply Medihoney to wound bed daily, cover with dry gauze and silicone foam or ABD pad whichever is preferred.  WOC team will follow weekly for ongoing assessment and change in POC as necessary.  Gillermo Lack, RN, MSN, [facility] WOC Team

## 2023-07-29 NOTE — Progress Notes (Signed)
 NAME:  Frank Dado., MRN:  [id number], DOB:  August 23, 1965, LOS: 26 ADMISSION DATE:  07/03/2023, CONSULTATION DATE:  07/06/23 REFERRING MD:  Dr Maury Space, CHIEF COMPLAINT:  Encephalopathy   History of Present Illness:   PCCM asked to see patient for encephalopathy   Transferred from [facility] with 1 week of generalized weakness, altered mentation, decreased appetite Recently being worked up for multiple myeloma.  Transferred to [facility] for oncology evaluation Nephrology consulted for AKI   Background history of obesity, asthma, obstructive sleep apnea on CPAP GERD, hypertension, hyperlipidemia Has been feeling weak for the last few days according to spouse Admitted with concern for sepsis, community-acquired pneumonia, antibiotic encephalopathy Workup revealed lytic bony lesions with concern for multiple myeloma Transfused with 3 units packed red blood cells Evaluation so far-bone marrow 07/04/2023  Pertinent  Medical History   Past Medical History:  Diagnosis Date   Anxiety    Arthritis    Asthma    Bipolar disorder (HCC)    Current every day smoker    Depression    Diabetes mellitus, type II (HCC)    Dyspnea    History of kidney stones    History of pneumonia    Hyperlipidemia    Hypertension    Morbid obesity (HCC)    Sedentary lifestyle    Seizures (HCC)    last seizure 12 yrs ago, no current problem   Sleep apnea    uses CPAP nightly   Significant Hospital Events: Including procedures, antibiotic start and stop dates in addition to other pertinent events   4/19-blood cultures, MRSA PCR negative 4/19 chest x-ray with mild pulmonary congestion 4/20 atrial fibrillation with RVR 4/21 nosebleed after IR NG attempt, NG placed by PCCM in afternoon, stable transferred to [facility] 4/22 intubated; some abla requiring prbcs; ct abd/pelvis showing acute groin hematoma tracking from posterior presumed from site of bone marrow biopsy; heparin  held EEG 4/25 >> moderate to severe  encephalopathy without any seizures or epileptiform discharges 4/23 respiratory culture >> MRSE 4/28 remains intubated on CRRT, down to one pressor  4/29 labile BP, NE back up. Responded a bit to fluids. Received Velcade  and Cytoxan  chemotherapy 5/1 remains encephalopathic, check CTH 5/2 LP ETT exchange  5/5 1 PRBC. Palliative care met w family  5/6 IVIG. plan for trach established. Added midodrine  after failed vaso wean. Abx/fungal changed to levaquin   5/7 weaning pressors incr midodrine  incr lactulose . Vaso off  5/8 Trach placed. Some post op bleeding improved with thrombi-pad 5/10 voriconazole  added back based on CT Chest findings, MRI brain with no acute changes 5/13 more alert and able to follow some commands today   Interim History / Subjective:   Tmax 101.8 Remains on Precedex , fentanyl  Following more commands today  Objective   Blood pressure (!) 82/67, pulse 71, temperature (!) 101.3 F (38.5 C), resp. rate 17, height 6' 0.01" (1.829 m), weight 100 kg, SpO2 97%.    Vent Mode: PRVC FiO2 (%):  [30 %] 30 % Set Rate:  [20 bmp] 20 bmp Vt Set:  [540 mL] 540 mL PEEP:  [5 cmH20] 5 cmH20 Pressure Support:  [8 cmH20] 8 cmH20 Plateau Pressure:  [15 cmH20-17 cmH20] 15 cmH20   Intake/Output Summary (Last 24 hours) at 07/29/2023 1137 Last data filed at 07/29/2023 1000 Gross per 24 hour  Intake 2331.81 ml  Output 3600 ml  Net -1268.19 ml   Filed Weights   07/26/23 0500 07/27/23 0700 07/29/23 0500  Weight: 100.6 kg 105.1 kg 100  kg     Physical Exam: General: Chronically ill-appearing, moist oral mucosa HEENT: Tracheostomy in place, no significant secretions Neuro: Moving all extremities spontaneously, not following commands CV: S1-S2 appreciated PULM: Coarse breath sounds, rales at the bases GI: Soft, bowel sounds appreciated Extremities: Trace bilateral lower extremity edema  __________________________________________________________  Micro: 4/28 BCx > no growth,  final 4/30 respiratory culture-few PMN, rare budding yeast, abundant GPC> few aspergillus fumigatus Quantiferon 5/2 > neg Trach asp 5/4> rare GPC, rare mold  PCP smear trach asp 5/5 > neg  Trach asp 5/9 > abundant staph hemolyticus, few budding yeast  CSF studies 1 RBC, 1 WBC, glucose 106 CSF culture-2 organisms acute, rare WBC> NGTD Cryptococcal antigen negative meningitis and encephalitis panel negative VDRL  non-reactive   AFB CSF 5/2 >>>>  BUN 90, creatinine 4.11 Albumin  less than 1.5  Reviewed CT scan of the chest 07/26/2023 Nodular densities, groundglass infiltration  Blood cultures 5/11-no growth  Respiratory culture 5/9 Abundant Staphylococcus hemolyticus, few budding yeast seen  Resolved Problems  Shock  Assessment & Plan:   Metabolic encephalopathy Seizure disorder Hyperammonemia -Meningitis/encephalitis panel negative on LP -Remains on lactulose  -Delirium precautions -Weaning sedation -On home Paxil  and gabapentin  -VPA on hold  Acute hypoxemic respiratory failure Bilateral pleural effusions History of obstructive sleep apnea History of tobacco abuse Aspergillus pneumonia - On voriconazole  - Completed Levaquin  - Appreciate infectious disease follow-up -Continue bronchodilators  Small bilateral pleural effusions - Continue to monitor  Sepsis due to pneumonia Aspergillus pneumonia - Continue midodrine  - Continue antibiotics -On voriconazole   Persistent fevers Concern for paraneoplastic fevers in the context of his multiple myeloma - On voriconazole  - On naproxen -72-hour trial  Multiple myeloma Neutropenia - Received IVIG on 5/6 S/p G-CSF - Chemotherapy on hold at present - Did receive Velcade  and Cytoxan  on 4/29  Acute kidney injury on chronic kidney disease stage IIIa CRRT was stopped on 5/10 - Intermittent hemodialysis  Paroxysmal atrial fibrillation - In sinus rhythm Plan is to continue amiodarone  200 per tube for 14 days, to  discontinue 5/20 - Could consider anticoagulation should he go back into atrial fibrillation  Multifactorial anemia Thrombocytopenia - This appears to be improving - May be related to critical illness, his multiple myeloma, possible drug-related - Continue to monitor closely  Type 2 diabetes - On glargine 10 units twice daily  Hypophosphatemia - Levels been repeated - Uric acid ordered  Appreciate palliative care involvement regarding goals of care discussions  GOC: See IPAL from 5/7. Wife now updating goals to include 2-3 rounds of CPR maximum should he arrest.  Best Practice (right click and "Reselect all SmartList Selections" daily)   Diet/type: tubefeeds  DVT prophylaxis prophylactic heparin   Pressure ulcer(s): identified on: 19/Apr/2025 GI prophylaxis: H2B Lines: Dialysis Catheter Foley:  na  Code Status:  full code Last date of multidisciplinary goals of care discussion d/w wife 5/7. See IPAL  The patient is critically ill with multiple organ systems failure and requires high complexity decision making for assessment and support, frequent evaluation and titration of therapies, application of advanced monitoring technologies and extensive interpretation of multiple databases. Critical Care Time devoted to patient care services described in this note independent of APP/resident time (if applicable)  is 32 minutes.   Myer Artis MD [facility] Personal pager: See Amion If unanswered, please page CCM On-call: #[phone]

## 2023-07-29 NOTE — Progress Notes (Signed)
 [facility] NEPHROLOGY PROGRESS NOTE   Subjective:  Seen in ICU Off/ on levo gtt 1-3 mcg/ min overnight, off today   Objective Vital signs in last 24 hours: Vitals:   07/26/23 1100 07/26/23 1125 07/26/23 1130 07/26/23 1200  BP: (!) 179/96 (!) 146/85 115/72 (!) 83/50  Pulse: (!) 105 96 93 76  Resp: 16 20 20 20   Temp:    99.8 F (37.7 C)  TempSrc:    Axillary  SpO2: 100% 98% 98% 99%  Weight:      Height:        Physical Exam: General: Critically ill looking male, intubated Heart:RRR, s1s2 nl Lungs: Coarse breath sound bilateral on vent Abdomen:soft, Non-tender, non-distended Extremities:No edema, LUE more tense now with 1+ pitting edema, R trace  Neurology: not responsive, on vent     Assessment/ Plan: Pt is a 58 y.o. yo male with past medical history significant for HTN, HLD, type II DM, BPH, seizure who was initially presented at [facility] due to AMS, admitted for sepsis due to pneumonia, encephalopathy, sepsis, AKI, hypercalcemia symptomatic anemia and Lytic pulm lesion concern for multiple myeloma.  Problems # AKI on CKD 3a - b/l creat 1.1- 1.5 from 2024, eGFR 53- >60 ml/min. Creat here 5.8 on presentation at OSH. AKI due to myeloma kidney +/- hyperCa +/- ACEi (home med). Started CRRT on 4/23. Pt remains anuric, and tolerating CRRT.  No further clotting after circuit heparin  increased 5/6.  Pressors weaned off. Remained hemodynamically stable and CRRT dc'd 5/10 and pt moved to [facility] for iHD.  Had iHD yesterday afternoon. Next HD Wed.   # AMS/acute metabolic encephalopathy: per primary, seems to be protracted.  MRI, LP completed and unrevealing.   #Volume - appearing euvolemic now, and wt's are rising some. IV drips are minimal w/ fentanyl  at 20 cc/ hr and TF's at 50 cc/hr.   # Shock- off pressors, cont midodrine  10 tid now.   # Multiple myeloma: Oncologist started Velcade  and Cytoxan , per oncology team.   On hold for now given ongoing critical  illness.  #AHRF: s/p trach 5/8, per CCM  # Atrial fib-  on amio  #Anemia - prn transfusions. On epogen  per onc.     Larry Poag  MD  [facility] 07/26/2023, 1:31 PM  Recent Labs  Lab 07/24/23 0421 07/24/23 1630 07/25/23 1508 07/26/23 0423  HGB 8.9*  --   --  8.9*  ALBUMIN  1.6*   < > 1.6* 1.8*  1.6*  CALCIUM  7.4*   < > 7.7* 7.7*  7.7*  PHOS 2.0*  1.5*   < > RESULTS UNAVAILABLE DUE TO INTERFERING SUBSTANCE 1.9*  1.5*  CREATININE 1.59*   < > 1.62* 1.77*  1.74*  K 4.3   < > 4.6 4.4  4.5   < > = values in this interval not displayed.    Inpatient medications:  amiodarone   200 mg Per NG tube Daily   arformoterol   15 mcg Nebulization BID   busPIRone   15 mg Per Tube QHS   clonazePAM   1 mg Per Tube BID   docusate  100 mg Per Tube BID   epoetin  alfa  40,000 Units Subcutaneous QODAY   famotidine   10.4 mg Per Tube Daily   feeding supplement (PROSource TF20)  60 mL Per Tube TID   folic acid   1 mg Per Tube Daily   gabapentin   100 mg Per Tube Q12H   heparin  injection (subcutaneous)  5,000 Units Subcutaneous Q8H  insulin  aspart  0-20 Units Subcutaneous Q4H   insulin  aspart  8 Units Subcutaneous Q4H   insulin  glargine-yfgn  10 Units Subcutaneous BID   lactulose   20 g Per Tube TID   leptospermum manuka honey  1 Application Topical Daily   lidocaine -EPINEPHrine   20 mL Intradermal Once   midodrine   10 mg Per Tube TID WC   nicotine   7 mg Transdermal Daily   mouth rinse  15 mL Mouth Rinse Q2H   PARoxetine   40 mg Per Tube Daily   pneumococcal 20-valent conjugate vaccine  0.5 mL Intramuscular Tomorrow-1000   polyethylene glycol  17 g Per Tube Daily   QUEtiapine   50 mg Per Tube QHS   revefenacin   175 mcg Nebulization Daily   sodium chloride  flush  10-40 mL Intracatheter Q12H   sodium chloride  HYPERTONIC  4 mL Nebulization Daily   thiamine   100 mg Per Tube Daily    ampicillin-sulbactam (UNASYN) IV Stopped (07/26/23 1914)   dexmedetomidine  (PRECEDEX ) IV infusion 1.2 mcg/kg/hr (07/26/23  1300)   feeding supplement (VITAL 1.5 CAL) 60 mL/hr at 07/26/23 1300   fentaNYL  infusion INTRAVENOUS 200 mcg/hr (07/26/23 1300)   levofloxacin  (LEVAQUIN ) IV 100 mL/hr at 07/26/23 1300   midazolam      norepinephrine  (LEVOPHED ) Adult infusion 2 mcg/min (07/26/23 1300)   sodium PHOSPHATE  IVPB (in mmol)     voriconazole  Stopped (07/26/23 1225)   Followed by   [name] ON 07/27/2023] voriconazole      acetaminophen  (TYLENOL ) oral liquid 160 mg/5 mL **OR** acetaminophen , fentaNYL , ipratropium-albuterol , midazolam , ondansetron  (ZOFRAN ) IV, mouth rinse, oxyCODONE , polyethylene glycol, polyvinyl alcohol , sodium chloride  flush

## 2023-07-30 DIAGNOSIS — B441 Other pulmonary aspergillosis: Secondary | ICD-10-CM | POA: Diagnosis not present

## 2023-07-30 DIAGNOSIS — R531 Weakness: Secondary | ICD-10-CM | POA: Diagnosis not present

## 2023-07-30 DIAGNOSIS — J90 Pleural effusion, not elsewhere classified: Secondary | ICD-10-CM | POA: Diagnosis not present

## 2023-07-30 DIAGNOSIS — Z87891 Personal history of nicotine dependence: Secondary | ICD-10-CM | POA: Diagnosis not present

## 2023-07-30 DIAGNOSIS — J9601 Acute respiratory failure with hypoxia: Secondary | ICD-10-CM | POA: Diagnosis not present

## 2023-07-30 LAB — CBC WITH DIFFERENTIAL/PLATELET
Abs Immature Granulocytes: 0 10*3/uL (ref 0.00–0.07)
Basophils Absolute: 0.4 10*3/uL — ABNORMAL HIGH (ref 0.0–0.1)
Basophils Relative: 3 %
Eosinophils Absolute: 0.4 10*3/uL (ref 0.0–0.5)
Eosinophils Relative: 3 %
HCT: 26.4 % — ABNORMAL LOW (ref 39.0–52.0)
Hemoglobin: 7.9 g/dL — ABNORMAL LOW (ref 13.0–17.0)
Lymphocytes Relative: 10 %
Lymphs Abs: 1.5 10*3/uL (ref 0.7–4.0)
MCH: 30.3 pg (ref 26.0–34.0)
MCHC: 29.9 g/dL — ABNORMAL LOW (ref 30.0–36.0)
MCV: 101.1 fL — ABNORMAL HIGH (ref 80.0–100.0)
Monocytes Absolute: 0.3 10*3/uL (ref 0.1–1.0)
Monocytes Relative: 2 %
Neutro Abs: 12.2 10*3/uL — ABNORMAL HIGH (ref 1.7–7.7)
Neutrophils Relative %: 82 %
Platelets: 290 10*3/uL (ref 150–400)
RBC: 2.61 MIL/uL — ABNORMAL LOW (ref 4.22–5.81)
RDW: 21.7 % — ABNORMAL HIGH (ref 11.5–15.5)
WBC: 14.9 10*3/uL — ABNORMAL HIGH (ref 4.0–10.5)
nRBC: 0 /100{WBCs}
nRBC: 9.4 % — ABNORMAL HIGH (ref 0.0–0.2)

## 2023-07-30 LAB — COMPREHENSIVE METABOLIC PANEL WITH GFR
ALT: 8 U/L (ref 0–44)
AST: 12 U/L — ABNORMAL LOW (ref 15–41)
Albumin: 1.7 g/dL — ABNORMAL LOW (ref 3.5–5.0)
Alkaline Phosphatase: 76 U/L (ref 38–126)
Anion gap: 15 (ref 5–15)
BUN: 83 mg/dL — ABNORMAL HIGH (ref 6–20)
CO2: 23 mmol/L (ref 22–32)
Calcium: 8.1 mg/dL — ABNORMAL LOW (ref 8.9–10.3)
Chloride: 101 mmol/L (ref 98–111)
Creatinine, Ser: 4.4 mg/dL — ABNORMAL HIGH (ref 0.61–1.24)
GFR, Estimated: 15 mL/min — ABNORMAL LOW (ref >60)
Glucose, Bld: 123 mg/dL — ABNORMAL HIGH (ref 70–99)
Potassium: 3.7 mmol/L (ref 3.5–5.1)
Sodium: 139 mmol/L (ref 135–145)
Total Bilirubin: 0.8 mg/dL (ref 0.0–1.2)
Total Protein: 8 g/dL (ref 6.5–8.1)

## 2023-07-30 LAB — GLUCOSE, CAPILLARY
Glucose-Capillary: 108 mg/dL — ABNORMAL HIGH (ref 70–99)
Glucose-Capillary: 110 mg/dL — ABNORMAL HIGH (ref 70–99)
Glucose-Capillary: 104 mg/dL — ABNORMAL HIGH (ref 70–99)
Glucose-Capillary: 120 mg/dL — ABNORMAL HIGH (ref 70–99)

## 2023-07-30 LAB — FUNGITELL BETA-D-GLUCAN: Result Name:: POSITIVE — AB

## 2023-07-30 LAB — AMMONIA: Ammonia: 64 umol/L — ABNORMAL HIGH (ref 9–35)

## 2023-07-30 LAB — PHOSPHORUS: Phosphorus: 4.9 mg/dL — ABNORMAL HIGH (ref 2.5–4.6)

## 2023-07-30 MED ORDER — HEPARIN SODIUM (PORCINE) 1000 UNIT/ML DIALYSIS: 2500.0000 [IU] | Freq: Once | INTRAMUSCULAR | Status: DC

## 2023-07-30 MED ORDER — HEPARIN SODIUM (PORCINE) 1000 UNIT/ML DIALYSIS: 1000.0000 [IU] | INTRAMUSCULAR | Status: DC | PRN

## 2023-07-30 MED ORDER — PENTAFLUOROPROP-TETRAFLUOROETH EX AERO
1.0000 | INHALATION_SPRAY | CUTANEOUS | Status: DC | PRN
Start: 2023-07-30 — End: 2023-07-30

## 2023-07-30 MED ORDER — DOCUSATE SODIUM 50 MG/5ML PO LIQD: 100.0000 mg | Freq: Two times a day (BID) | ORAL | Status: DC | PRN

## 2023-07-30 MED ORDER — HEPARIN SODIUM (PORCINE) 1000 UNIT/ML IJ SOLN: 5000.0000 [IU] | INTRAMUSCULAR | Status: DC | PRN

## 2023-07-30 MED ORDER — NEPRO/CARBSTEADY PO LIQD: 237.0000 mL | ORAL | Status: DC | PRN

## 2023-07-30 MED ORDER — HEPARIN SODIUM (PORCINE) 1000 UNIT/ML DIALYSIS: 2500.0000 [IU] | INTRAMUSCULAR | Status: DC | PRN

## 2023-07-30 MED ORDER — VANCOMYCIN HCL IN DEXTROSE 1-5 GM/200ML-% IV SOLN
1000.0000 mg | INTRAVENOUS | Status: AC
  Administered 2023-07-30: 1000 mg via INTRAVENOUS
  Filled 2023-07-30: qty 200

## 2023-07-30 MED ORDER — HEPARIN SODIUM (PORCINE) 1000 UNIT/ML DIALYSIS
2500.0000 [IU] | INTRAMUSCULAR | Status: DC | PRN
  Administered 2023-07-30 – 2023-08-01 (×3): 2500 [IU] via INTRAVENOUS_CENTRAL
  Filled 2023-07-30 (×4): qty 3

## 2023-07-30 MED ORDER — HEPARIN SODIUM (PORCINE) 1000 UNIT/ML IJ SOLN
INTRAMUSCULAR | Status: AC
  Filled 2023-07-30: qty 7

## 2023-07-30 MED ORDER — LIDOCAINE HCL (PF) 1 % IJ SOLN: 5.0000 mL | INTRAMUSCULAR | Status: DC | PRN

## 2023-07-30 MED ORDER — ANTICOAGULANT SODIUM CITRATE 4% (200MG/5ML) IV SOLN: 5.0000 mL | Status: DC | PRN

## 2023-07-30 MED ORDER — POLYETHYLENE GLYCOL 3350 17 G PO PACK: 17.0000 g | PACK | Freq: Every day | ORAL | Status: DC | PRN

## 2023-07-30 MED ORDER — LIDOCAINE-PRILOCAINE 2.5-2.5 % EX CREA: 1.0000 | TOPICAL_CREAM | CUTANEOUS | Status: DC | PRN

## 2023-07-30 NOTE — Evaluation (Signed)
 Physical Therapy Evaluation Patient Details Name: Frank [name]. MRN: [id number] DOB: 12/28/1965 Today's Date: 07/30/2023  History of Present Illness  58 y.o. male transferred from [facility] 07/03/23 to [facility] for management of newly diagnosed plasma cell myeloma with weakness and AMS. Pt also with AKI and metabolic encephalopathy. 5/10 transfer to [facility] for iHD. 4/22 Intubated and chemo initiated. 4/23-5/10 CRRT. 5/6 IVIG started. 5/8 trach. 5/12 iHD initiated. PMHx:Lt THA, carpal tunnel syndrome, Lt TKA, Lt humerus fx, PAF, HTN, HLD, bipolar disorder, obesity, T2DM  Clinical Impression  Pt admitted with above diagnosis and presents to PT with functional limitations due to deficits listed below (See PT problem list). Pt needs skilled PT to maximize independence and safety. Currently pt only following simple commands intermittently. Will follow and mobilize as able. Will need arousal and command following to improve to see progress. Patient will benefit from continued inpatient follow up therapy, <3 hours/day.           If plan is discharge home, recommend the following: Two people to help with walking and/or transfers;Two people to help with bathing/dressing/bathroom;Assistance with cooking/housework;Assistance with feeding;Assist for transportation;Direct supervision/assist for financial management;Direct supervision/assist for medications management   Can travel by private vehicle   No    Equipment Recommendations Other (comment) (to be determined)  Recommendations for Other Services       Functional Status Assessment Patient has had a recent decline in their functional status and/or demonstrates limited ability to make significant improvements in function in a reasonable and predictable amount of time     Precautions / Restrictions Precautions Precautions: Fall;Other (comment) Recall of Precautions/Restrictions: Impaired Precaution/Restrictions Comments:  trach Restrictions Weight Bearing Restrictions Per Provider Order: No      Mobility  Bed Mobility Overal bed mobility: Needs Assistance Bed Mobility: Rolling Rolling: +2 for physical assistance, Total assist         General bed mobility comments: Assist for all aspects of rolling. Pt placed in chair position - trunk leaning lt.    Transfers                   General transfer comment: Will need mechanical lift    Ambulation/Gait                  Stairs            Wheelchair Mobility     Tilt Bed    Modified Rankin (Stroke Patients Only)       Balance Overall balance assessment: Needs assistance Sitting-balance support: Bilateral upper extremity supported, Feet supported Sitting balance-Leahy Scale: Zero Sitting balance - Comments: In chair position unable to maintain midline                                     Pertinent Vitals/Pain Pain Assessment Facial Expression: Relaxed, neutral Body Movements: Restlessness Muscle Tension: Tense, rigid Compliance with ventilator (intubated pts.): N/A Vocalization (extubated pts.): Talking in normal tone or no sound CPOT Total: 3    Home Living Family/patient expects to be discharged to:: Private residence Living Arrangements: Spouse/significant other Available Help at Discharge: Family;Available PRN/intermittently Type of Home: Mobile home Home Access: Ramped entrance       Home Layout: One level Home Equipment: Agricultural consultant (2 wheels);Cane - single point;Wheelchair - Engineer, technical sales - power;Electric scooter;Shower seat;Grab bars - toilet;Grab bars - tub/shower;Hand held shower head;BSC/3in1 Additional Comments: Information from prior encounter  Prior Function Prior Level of Function : Patient poor historian/Family not available                     Extremity/Trunk Assessment   Upper Extremity Assessment Upper Extremity Assessment: Defer to OT evaluation     Lower Extremity Assessment Lower Extremity Assessment: RLE deficits/detail;LLE deficits/detail RLE Deficits / Details: Spontaneous general movement of LE's but not to command. Movement primarily at hips and none seen at ankles. PROM in ankles with effort to achieve neutral dorsiflexion LLE Deficits / Details: Spontaneous general movement of LE's but not to command. Movement primarily at hips and none seen at ankles. PROM in ankle with effort to achieve -10 degrees from neutral dorsiflexion       Communication   Communication Communication: Impaired Factors Affecting Communication: Trach/intubated;Difficulty expressing self    Cognition Arousal: Lethargic Behavior During Therapy: Restless   PT - Cognitive impairments: Difficult to assess Difficult to assess due to: Impaired communication, Tracheostomy, Level of arousal                     PT - Cognition Comments: Pt alert for brief periods with rolling and putting up in chair position. Intermittently following 1 step commands Following commands: Impaired Following commands impaired:  (Intermittently following 1 step commands)     Cueing Cueing Techniques: Tactile cues, Visual cues, Verbal cues     General Comments General comments (skin integrity, edema, etc.): VSS on trach collar at 50%    Exercises Low Level/ICU Exercises Ankle Circles/Pumps: PROM, Both, 10 reps, Supine Hip ABduction/ADduction: PROM, Both, 5 reps, Supine Heel Slides: PROM, Both, 5 reps, Supine   Assessment/Plan    PT Assessment Patient needs continued PT services  PT Problem List Decreased strength;Decreased range of motion;Decreased activity tolerance;Decreased balance;Decreased mobility;Decreased cognition       PT Treatment Interventions DME instruction;Functional mobility training;Therapeutic activities;Therapeutic exercise;Balance training;Patient/family education;Cognitive remediation;Gait training    PT Goals (Current goals can be  found in the Care Plan section)  Acute Rehab PT Goals Patient Stated Goal: Pt unable PT Goal Formulation: Patient unable to participate in goal setting Time For Goal Achievement: 08/13/23 Potential to Achieve Goals: Fair    Frequency Min 1X/week     Co-evaluation               AM-PAC PT "6 Clicks" Mobility  Outcome Measure Help needed turning from your back to your side while in a flat bed without using bedrails?: Total Help needed moving from lying on your back to sitting on the side of a flat bed without using bedrails?: Total Help needed moving to and from a bed to a chair (including a wheelchair)?: Total Help needed standing up from a chair using your arms (e.g., wheelchair or bedside chair)?: Total Help needed to walk in hospital room?: Total Help needed climbing 3-5 steps with a railing? : Total 6 Click Score: 6    End of Session Equipment Utilized During Treatment: Oxygen (trach collar) Activity Tolerance: Patient limited by lethargy Patient left: in bed;with call bell/phone within reach;with nursing/sitter in room Nurse Communication: Mobility status (nurse present and assisting) PT Visit Diagnosis: Other abnormalities of gait and mobility (R26.89);Muscle weakness (generalized) (M62.81);Difficulty in walking, not elsewhere classified (R26.2)    Time: 1610-9604 PT Time Calculation (min) (ACUTE ONLY): 20 min   Charges:   PT Evaluation $PT Eval Moderate Complexity: 1 Mod   PT General Charges $$ ACUTE PT VISIT: 1 Visit  [facility] PT Acute Rehabilitation Services Office [phone]   Pura Browns [facility] 07/30/2023, 4:29 PM

## 2023-07-30 NOTE — Progress Notes (Signed)
 Spoke with lab regarding elevated phosphorus levels on blood work on 07/29/2023 that were drawn multiple times  Samples will be sent to [facility] to reconfirm phosphorus levels   Levels did correct without intervention 07/30/2023

## 2023-07-30 NOTE — Progress Notes (Addendum)
 +Frank [name].   DOB:[birth year]   VW#:[id number]      ASSESSMENT & PLAN:  Patient transferred from [facility] mid April 2025 with approx one week history of generalized weakness, decreased appetite and altered mental status.  He was admitted with concern for sepsis, PNA, and encephalopathy.  Work-up was done and showed lytic lesions concerning for multiple myeloma. Oncology asked to evaluate and manage.    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 - Initiated chemotherapy with Velcade  and Cytoxan  07/08/23 with second dose given 07/11/23.  Also given high-dose steroids.   -- Good response to chemotherapy as evidenced by decreased myeloma levels. Chemotherapy on Hold.  However may restart if protein levels trend upwards.  -- status post IVIG.  S/p G-CSF for neutropenia. - Total protein 8.0 with low albumin  1.7. - Medical oncology/Dr. Maria Shiner following closely  Acute respiratory failure Hypoxia - Patient was intubated.  Now has trach intact, weaned from vent.  Still with thick mucus secretions noted.   - Management per critical care   Anemia - Likely secondary to malignancy - HGB low 7.9. -- Recommend PRBC transfusion for hemoglobin <7.0 - Monitor CBC with differential closely   Leukocytosis Sepsis  -- WBC down trending nicely, 14.9 today  -- Still with intermittent fevers, continue to monitor fever curve -- Continue antibiotics as ordered -- Monitor CBCD    CKD - was on CRRT, transferred to [facility] for HD, done earlier today per nurse. - Nephrology managing   Altered mental status Acute metabolic encephalopathy - appears stable - Continue supportive care   Hypertension Hyperlipidemia Diabetes - Monitor blood pressure levels - Monitor blood glucose levels, insulin  coverage per protocol - Medicine following        Code Status Full  Subjective:  Patient seen in  ICU with trach intact.  NG feeding tube infusing.  He is minimally responsive to verbal stimuli by nodding his head and squeezes finger.  Discussed with nurse who states he continues to have intermittent fevers.  Also states he had dialysis earlier today.  No other acute distress is noted.  Objective:   Intake/Output Summary (Last 24 hours) at 07/30/2023 1344 Last data filed at 07/30/2023 1214 Gross per 24 hour  Intake 2275.15 ml  Output 2050 ml  Net 225.15 ml     PHYSICAL EXAMINATION: ECOG PERFORMANCE STATUS: 4 - Bedbound  Vitals:   07/30/23 1300 07/30/23 1315  BP: (!) 153/91 (!) 144/101  Pulse: (!) 109 (!) 107  Resp: 17 16  Temp:    SpO2: 97% 94%   Filed Weights   07/27/23 0700 07/29/23 0500 07/30/23 0827  Weight: 231 lb 11.3 oz (105.1 kg) 220 lb 7.4 oz (100 kg) 220 lb 7.4 oz (100 kg)    GENERAL: alert, +trach SKIN: + Pale skin color, texture, turgor are normal, no rashes or significant lesions EYES: normal, conjunctiva are pink and non-injected, sclera clear OROPHARYNX: no exudate, no erythema and lips, buccal mucosa, and tongue normal  LUNGS: clear to auscultation and percussion with normal breathing effort HEART: regular rate & rhythm and no murmurs and no lower extremity edema ABDOMEN: abdomen soft, non-tender and normal bowel sounds MUSCULOSKELETAL: no cyanosis of digits and no clubbing  PSYCH: + Somewhat alert    All questions were answered. The patient knows to call the clinic with any problems, questions or concerns.   The total time spent in the appointment was 40  minutes encounter with patient including review of chart and various tests results, discussions about plan of care and coordination of care plan  Jacqualin Mate, NP 07/30/2023 1:44 PM    Labs Reviewed:  Lab Results  Component Value Date   WBC 14.9 (H) 07/30/2023   HGB 7.9 (L) 07/30/2023   HCT 26.4 (L) 07/30/2023   MCV 101.1 (H) 07/30/2023   PLT 290 07/30/2023   Recent Labs    07/23/23 0416  07/23/23 1722 07/28/23 0406 07/29/23 0446 07/29/23 1635 07/30/23 0424  NA 128*   < > 139 135 139 139  K 4.2   < > 5.2* 3.6 4.2 3.7  CL 99   < > 105 99 100 101  CO2 24   < > 21* 26 24 23   GLUCOSE 128*   < > 124* 92 108* 123*  BUN 33*   < > 90* 54* 69* 83*  CREATININE 1.62*   < > 4.11* 3.03* 3.71* 4.40*  CALCIUM  7.5*   < > 8.0* 7.9* 8.3* 8.1*  GFRNONAA 49*   < > 16* 23* 18* 15*  PROT 8.5*   < > 7.9 7.7  --  8.0  ALBUMIN  <1.5*  <1.5*   < > <1.5* 1.6* 1.8* 1.7*  AST 19   < > 11* 17  --  12*  ALT 14   < > 11 11  --  8  ALKPHOS 59   < > 95 81  --  76  BILITOT 0.5   < > 0.7 0.8  --  0.8  BILIDIR <0.1  --   --   --   --   --   IBILI NOT CALCULATED  --   --   --   --   --    < > = values in this interval not displayed.    Studies Reviewed:  MR BRAIN WO CONTRAST Result Date: 07/26/2023 CLINICAL DATA:  Follow-up examination for aspergillosis, altered mental status, immunosuppressed. EXAM: MRI HEAD WITHOUT CONTRAST TECHNIQUE: Multiplanar, multiecho pulse sequences of the brain and surrounding structures were obtained without intravenous contrast. COMPARISON:  MRI from 07/18/2023. FINDINGS: Brain: Cerebral volume within normal limits for age. Patchy T2/FLAIR hyperintensity involving the supratentorial cerebral white matter, most like related chronic microvascular ischemic disease, stable. No evidence for acute or subacute infarct. No areas of chronic cortical infarction or other insult. No acute intracranial hemorrhage. Single punctate chronic microhemorrhage noted at the left frontal corona radiata, stable. No mass lesion, midline shift or mass effect. Ventricles stable in size without hydrocephalus. No extra-axial fluid collection. Pituitary gland and suprasellar region within normal limits. No evidence for acute CNS infection by MRI. Vascular: Major intracranial vascular flow voids are stable and maintained. Skull and upper cervical spine: Craniocervical junction within normal limits. Diffusely  abnormal appearance of the visualized osseous structures, consistent with Mets or myeloma, relatively stable from prior. No scalp soft tissue abnormality. Sinuses/Orbits: Globes and orbital soft tissues within normal limits. Scattered mucosal thickening present about the sphenoid and maxillary sinuses. Large right with moderate left mastoid effusions. Other: Incidental cystic lesion at the nasal bridge again noted, likely a dermoid/epidermoid, stable. IMPRESSION: 1. Stable brain MRI. No acute intracranial abnormality. 2. Diffusely abnormal appearance of the visualized osseous structures, concerning for metastatic disease or myeloma, stable. 3. Moderate cerebral white matter disease, nonspecific, but most commonly related to chronic microvascular ischemic disease. 4. Large right with moderate left mastoid effusions, of uncertain significance. Correlation with physical exam recommended. Electronically Signed  By: Virgia Griffins M.D.   On: 07/26/2023 18:25   DG Chest Port 1 View Result Date: 07/26/2023 CLINICAL DATA:  [id number] Increased tracheal secretions [id number] EXAM: PORTABLE CHEST 1 VIEW COMPARISON:  Jul 24, 2023 FINDINGS: The cardiomediastinal silhouette is unchanged in contour.Tracheostomy. The enteric tube courses through the chest to the abdomen with weighted metallic tip projecting over the stomach. RIGHT IJ CVC catheter tip terminates over the SVC. No pleural effusion. No pneumothorax. Perihilar vascular fullness. There is bronchial wall cuffing. Biapical airspace opacities and LEFT retrocardiac airspace opacity. Advanced degenerative changes of the shoulders. IMPRESSION: 1. Bronchial wall thickening with patchy bilateral airspace opacities likely reflect multifocal infection. Differential considerations include pulmonary edema. Electronically Signed   By: Clancy Crimes M.D.   On: 07/26/2023 10:31   CT CHEST WO CONTRAST Result Date: 07/26/2023 CLINICAL DATA:  Hypoxic respiratory failure.  Evaluate for changes suggestive of invasive fungal infection. EXAM: CT CHEST WITHOUT CONTRAST TECHNIQUE: Multidetector CT imaging of the chest was performed following the standard protocol without IV contrast. RADIATION DOSE REDUCTION: This exam was performed according to the departmental dose-optimization program which includes automated exposure control, adjustment of the mA and/or kV according to patient size and/or use of iterative reconstruction technique. COMPARISON:  07/20/2023 FINDINGS: Cardiovascular: Heart size is normal. There is no pericardial effusion. Aortic atherosclerosis and coronary artery calcifications. Mediastinum/Nodes: Tracheostomy tube tip is above the carina. Enteric tube tip is in the body of the stomach. No mediastinal or hilar adenopathy. Lungs/Pleura: Decreased volume of bilateral pleural effusions. Partial atelectasis of bilateral lower lobes identified. Progressive ground-glass and airspace densities within the left upper lobe noted. There also multiple nodular densities with surrounding ground-glass attenuation throughout both upper lung zones, also increased from the previous exam. Upper Abdomen: No acute abnormality. Nonobstructing stone within the upper pole of the right kidney, image 154/2. Musculoskeletal: Multiple lucent bone lesions are identified throughout the visualized bony thorax. These appears similar in distribution to the previous exam. Unchanged mild compression deformity involving the superior endplate of the T11 vertebra. IMPRESSION: 1. Decreased volume of bilateral pleural effusions. 2. Progressive ground-glass and airspace densities within the left upper lobe. There also multiple nodular densities with surrounding ground-glass attenuation throughout both upper lung zones, also increased from the previous exam. Findings are favored to represent multifocal pneumonia. Nodular densities with surrounding ground-glass attenuation may be seen in the setting of fungal  infection particularly and anion 0 compromised in visual. 3. Multiple lucent bone lesions are identified throughout the visualized bony thorax. These appears similar in distribution to the previous exam. Unchanged mild compression deformity involving the superior endplate of the T11 vertebra. Imaging findings are concerning for metastatic disease versus multiple myeloma. 4. Nonobstructing right renal stone. 5. Coronary artery calcifications. 6.  Aortic Atherosclerosis (ICD10-I70.0). Electronically Signed   By: Kimberley Penman M.D.   On: 07/26/2023 04:56   VAS US  UPPER EXTREMITY VENOUS DUPLEX Result Date: 07/25/2023 UPPER VENOUS STUDY  Patient Name:  Frank [name].  Date of Exam:   07/24/2023 Medical Rec #: [id number]           Accession #:    [id number] Date of Birth: 11/15/1965           Patient Gender: M Patient Age:   21 years Exam Location:  [facility] Procedure:      VAS US  UPPER EXTREMITY VENOUS DUPLEX Referring Phys: Donavon Fudge HOFFMAN --------------------------------------------------------------------------------  Indications: Edema Risk Factors: Multiple myeloma / chemotherapy. Comparison Study: No previous exams Performing  Technologist: Arlyce Berger RVT, RDMS  Examination Guidelines: A complete evaluation includes B-mode imaging, spectral Doppler, color Doppler, and power Doppler as needed of all accessible portions of each vessel. Bilateral testing is considered an integral part of a complete examination. Limited examinations for reoccurring indications may be performed as noted.  Right Findings: Subclvian vein not visualized due to patient recieving HD bedside  Left Findings: +----------+------------+---------+-----------+----------+---------------------+ LEFT      CompressiblePhasicitySpontaneousProperties       Summary        +----------+------------+---------+-----------+----------+---------------------+ IJV           Full       Yes       Yes                                     +----------+------------+---------+-----------+----------+---------------------+ Subclavian    Full       Yes       Yes                                    +----------+------------+---------+-----------+----------+---------------------+ Axillary      Full       Yes       Yes                                    +----------+------------+---------+-----------+----------+---------------------+ Brachial      Full       Yes       Yes                                    +----------+------------+---------+-----------+----------+---------------------+ Radial        Full                                                        +----------+------------+---------+-----------+----------+---------------------+ Ulnar         Full                                   only one of paired                                                            visualized       +----------+------------+---------+-----------+----------+---------------------+ Cephalic      Full                                                        +----------+------------+---------+-----------+----------+---------------------+ Basilic       Full       Yes       Yes                                    +----------+------------+---------+-----------+----------+---------------------+  Large non vascularized heterogenous area in mid and distal upper arm with cystic and solid appearing areas.  Summary:  Right: Unable to visualize subclavian vein on this exam.  Left: No evidence of deep vein thrombosis in the upper extremity. No evidence of superficial vein thrombosis in the upper extremity. Large non vascularized heterogenous area in mid and distal upper arm with cystic and solid appearing areas.  *See table(s) above for measurements and observations.  Diagnosing physician: Runell Countryman Electronically signed by Runell Countryman on 07/25/2023 at 9:32:22 AM.    Final    DG Chest Port 1 View Result Date: 07/24/2023 CLINICAL DATA:   Status post tracheostomy. EXAM: PORTABLE CHEST 1 VIEW COMPARISON:  Chest radiograph dated 07/22/2023. FINDINGS: Tracheostomy with tip approximately 5.5 cm above the carina. Enteric tube extends below the diaphragm with tip beyond the inferior margin of the image. Right IJ central venous line in similar position. Similar appearance of bilateral pulmonary opacities. No pleural effusion or pneumothorax. The cardiac silhouette. No acute osseous pathology. IMPRESSION: 1. Tracheostomy with tip approximately 5.5 cm above the carina. 2. Similar appearance of bilateral pulmonary opacities. Electronically Signed   By: Angus Bark M.D.   On: 07/24/2023 14:17   DG CHEST PORT 1 VIEW Result Date: 07/22/2023 CLINICAL DATA:  Aspiration pneumonia. EXAM: PORTABLE CHEST 1 VIEW COMPARISON:  Jul 18, 2023. FINDINGS: Stable cardiomediastinal silhouette. Tracheostomy and feeding tubes are unchanged. Right internal jugular catheter is unchanged. Increased bilateral lung opacities are noted diffusely consistent with edema or atypical inflammation. Severe degenerative changes are seen involving both glenohumeral joints. IMPRESSION: Stable support apparatus. Increased bilateral lung opacities concerning for edema or pneumonia. Electronically Signed   By: Rosalene Colon M.D.   On: 07/22/2023 14:39   DG Abd Portable 1V Result Date: 07/22/2023 CLINICAL DATA:  Ileus. EXAM: PORTABLE ABDOMEN - 1 VIEW COMPARISON:  July 07, 2023. FINDINGS: The bowel gas pattern is normal. Distal tip of feeding tube is seen in expected position of proximal stomach. No radio-opaque calculi or other significant radiographic abnormality are seen. IMPRESSION: No abnormal bowel dilatation. Electronically Signed   By: Rosalene Colon M.D.   On: 07/22/2023 14:38   CT CHEST WO CONTRAST Result Date: 07/20/2023 CLINICAL DATA:  Sepsis EXAM: CT CHEST WITHOUT CONTRAST TECHNIQUE: Multidetector CT imaging of the chest was performed following the standard protocol without  IV contrast. RADIATION DOSE REDUCTION: This exam was performed according to the departmental dose-optimization program which includes automated exposure control, adjustment of the mA and/or kV according to patient size and/or use of iterative reconstruction technique. COMPARISON:  None Available. FINDINGS: Cardiovascular: No significant vascular findings. The heart is mildly enlarged. There is no pericardial effusion. Aorta is normal in size. Central venous catheter tip ends in the SVC. Mediastinum/Nodes: Endotracheal tube tip is 2.4 cm above the carina. Enteric tube is seen throughout nondilated esophagus. No enlarged lymph nodes are identified allowing for lack of intravenous contrast. The visualized thyroid gland is within normal limits. Lungs/Pleura: There are small bilateral pleural effusions. There is bilateral lower lobe airspace consolidation. Multifocal patchy airspace areas and ill-defined ground-glass slightly nodular opacities are seen throughout the bilateral upper lobes and minimally in the right middle lobe, also likely infectious/inflammatory. There is no pneumothorax. Upper Abdomen: Gallstones are present. Enteric tube is partially visualized in the stomach. Musculoskeletal: There are severe degenerative changes in both shoulders. The bones are diffusely osteopenic. There are scattered lucent lesions throughout the thoracic spine measuring up to 8 mm. There is minimal  compression deformity of the superior endplate of T11. IMPRESSION: 1. Bilateral lower lobe airspace consolidation compatible with pneumonia. 2. Multifocal patchy airspace areas and ill-defined ground-glass slightly nodular opacities throughout the bilateral upper lobes and minimally in the right middle lobe, also likely infectious/inflammatory. 3. Small bilateral pleural effusions. 4. Mild cardiomegaly. 5. Cholelithiasis. 6. Scattered lucent lesions throughout the thoracic spine measuring up to 8 mm, nonspecific. Findings may be  related to multiple myeloma or metastatic disease. 7. Minimal compression deformity of the superior endplate of T11. Electronically Signed   By: Tyron Gallon M.D.   On: 07/20/2023 16:31   VAS US  LOWER EXTREMITY VENOUS (DVT) Result Date: 07/18/2023  Lower Venous DVT Study Patient Name:  Frank [name].  Date of Exam:   07/18/2023 Medical Rec #: [id number]           Accession #:    [id number] Date of Birth: Feb 21, 1966           Patient Gender: M Patient Age:   35 years Exam Location:  [facility] Procedure:      VAS US  LOWER EXTREMITY VENOUS (DVT) Referring Phys: ADITYA PALIWAL --------------------------------------------------------------------------------  Indications: Swelling, and Edema.  Risk Factors: Immobility Cancer myeloma. Limitations: Patient positioning. Comparison Study: None. Performing Technologist: Estanislao Heimlich  Examination Guidelines: A complete evaluation includes B-mode imaging, spectral Doppler, color Doppler, and power Doppler as needed of all accessible portions of each vessel. Bilateral testing is considered an integral part of a complete examination. Limited examinations for reoccurring indications may be performed as noted. The reflux portion of the exam is performed with the patient in reverse Trendelenburg.  +-----+---------------+---------+-----------+----------+--------------+ RIGHTCompressibilityPhasicitySpontaneityPropertiesThrombus Aging +-----+---------------+---------+-----------+----------+--------------+ CFV  Full           Yes      Yes                                 +-----+---------------+---------+-----------+----------+--------------+ Large hematoma noted at top of right leg  +---------+---------------+---------+-----------+----------+-------------------+ LEFT     CompressibilityPhasicitySpontaneityPropertiesThrombus Aging      +---------+---------------+---------+-----------+----------+-------------------+ CFV      Full           Yes       Yes                                      +---------+---------------+---------+-----------+----------+-------------------+ SFJ      Full                                                             +---------+---------------+---------+-----------+----------+-------------------+ FV Prox  Full                                                             +---------+---------------+---------+-----------+----------+-------------------+ FV Mid   Full                                                             +---------+---------------+---------+-----------+----------+-------------------+  FV DistalFull                                                             +---------+---------------+---------+-----------+----------+-------------------+ PFV      Full                                                             +---------+---------------+---------+-----------+----------+-------------------+ POP      Full           Yes      Yes                                      +---------+---------------+---------+-----------+----------+-------------------+ PTV                              Yes                  Not well visualized +---------+---------------+---------+-----------+----------+-------------------+ PERO                             Yes                  Not well visualized +---------+---------------+---------+-----------+----------+-------------------+     Summary: RIGHT: - No evidence of common femoral vein obstruction.   LEFT: - There is no evidence of deep vein thrombosis in the lower extremity.  - No cystic structure found in the popliteal fossa.  *See table(s) above for measurements and observations. Electronically signed by Delaney Fearing on 07/18/2023 at 4:52:00 PM.    Final    DG Chest 1 View Result Date: 07/18/2023 CLINICAL DATA:  Endotracheal tube. EXAM: CHEST  1 VIEW COMPARISON:  Jul 17, 2023. FINDINGS: Endotracheal and feeding tube are in grossly good  position. Right internal jugular catheter is unchanged. Mild bibasilar subsegmental atelectasis is noted with small pleural effusions. Degenerative changes are seen involving both shoulder joints. IMPRESSION: Support apparatus as noted above. Bibasilar atelectasis and probable small pleural effusions are noted. Electronically Signed   By: Rosalene Colon M.D.   On: 07/18/2023 15:58   DG FL GUIDED LUMBAR PUNCTURE Result Date: 07/18/2023 CLINICAL DATA:  Metabolic encephalopathy EXAM: DIAGNOSTIC LUMBAR PUNCTURE UNDER FLUOROSCOPIC GUIDANCE COMPARISON:  I assessed the brain imaging of 07/17/2023 and the CT abdomen of 07/08/2023 in preparation for this exam. FLUOROSCOPY: Radiation Exposure Index (as provided by the fluoroscopic device): 3.4 mGy Kerma PROCEDURE: The risks (including hemorrhage, infection, and nerve damage, among others), benefits, and alternatives to fluoroscopically guided lumbar puncture were discussed with the patient's wife by telephone, as the patient is unable to consent and has altered mental status. The patient's wife understood and elected for the patient to undergo the procedure. Standard time-out was employed. Following sterile skin prep and local anesthetic administration consisting of 1 percent lidocaine , a 22 gauge spinal needle was advanced without difficulty into the thecal sac at the L2-3 level under fluoroscopic guidance. Clear CSF was returned. Because the patient is  intubated, I did not turn him onto his side to obtain a pressure measurement. A total of 11.5 cc of clear CSF was collected in 4 vials. The needle was subsequently removed and the skin cleansed and bandaged. No immediate complications were observed. IMPRESSION: Technically successful fluoroscopically guided lumbar puncture at the L2-3 level. Electronically Signed   By: Freida Jes M.D.   On: 07/18/2023 13:39   MR BRAIN WO CONTRAST Result Date: 07/18/2023 CLINICAL DATA:  Delirium EXAM: MRI HEAD WITHOUT CONTRAST  TECHNIQUE: Multiplanar, multiecho pulse sequences of the brain and surrounding structures were obtained without intravenous contrast. COMPARISON:  CT head 07/17/2023. FINDINGS: Brain: No acute infarct. Punctate susceptibility in the left corona radiata suggestive of chronic microhemorrhage. No additional foci of hemorrhage appreciated. Scattered and confluent T2/FLAIR hyperintensity in the periventricular and subcortical white matter. No mass lesion or midline shift. Cerebellum is unremarkable. Normal appearance of midline structures. The basilar cisterns are patent. No extra-axial fluid collections. Ventricles: Normal size and configuration of the ventricles. Vascular: Skull base flow voids are visualized. Skull and upper cervical spine: Facet arthrosis in the visualized upper cervical spine most pronounced on the right at C3-4. Redemonstration of multiple calvarial lesions corresponding to lytic lesions on CT. Sinuses/Orbits: The orbits are symmetric. Minimal mucosal thickening in the alveolar recess of the right maxillary sinus. There is a 1.3 x 1.0 x 1.5 cm circumscribed cystic appearing lesion in the subcutaneous tissues overlying the nasal bones slightly right of midline which demonstrates internal restricted diffusion. No evidence of connection to the intracranial compartment. Other: Large right mastoid effusion. Trace fluid in the left mastoid air cells. Fluid within the nasopharynx. Partially visualized enteric tube and endotracheal tube. IMPRESSION: No acute intracranial abnormality. Nonspecific white matter signal abnormality likely reflecting moderate chronic microvascular ischemic changes. Multiple calvarial lesions corresponding to lytic lesions on CT. Findings concerning for multiple myeloma versus metastasis. 1.5 cm cystic lesion in the subcutaneous tissues overlying the nasal bones likely reflecting epidermoid cyst versus dermoid. No evidence of connection to the intracranial compartment. Large  right mastoid effusion. Electronically Signed   By: Denny Flack M.D.   On: 07/18/2023 12:54   CT HEAD WO CONTRAST ( ) Result Date: 07/17/2023 CLINICAL DATA:  58 year old male altered mental status. Respiratory failure. "Myeloma". EXAM: CT HEAD WITHOUT CONTRAST TECHNIQUE: Contiguous axial images were obtained from the base of the skull through the vertex without intravenous contrast. RADIATION DOSE REDUCTION: This exam was performed according to the departmental dose-optimization program which includes automated exposure control, adjustment of the mA and/or kV according to patient size and/or use of iterative reconstruction technique. COMPARISON:  No prior head CT.  CT Abdomen and Pelvis 07/08/2023. FINDINGS: Brain: Cerebral volume is within normal limits for age. No midline shift, ventriculomegaly, mass effect, evidence of mass lesion, intracranial hemorrhage or evidence of cortically based acute infarction. Gray-white differentiation maintained, mild to moderate for age scattered cerebral white matter hypodensity. Vascular: Calcified atherosclerosis at the skull base. No suspicious intracranial vascular hyperdensity. Skull: Scattered nonspecific round an oval lucent areas in the bilateral calvarium. No pathologic fracture identified. Sinuses/Orbits: Intubated. Right nasoenteric tube in place. Relatively well aerated paranasal sinuses, partial opacification of the right middle ear and mastoids. Other: Visualized orbits and scalp soft tissues are within normal limits. IMPRESSION: 1. No acute intracranial abnormality. Mild to moderate for age cerebral white matter changes most commonly due to small vessel disease. 2. Scattered lytic lesions in the skull, similar to the skeletal findings on 07/08/2023, and highly suspicious for  Multiple Myeloma versus lytic metastases. 3. Intubated. Electronically Signed   By: Marlise Simpers M.D.   On: 07/17/2023 14:37   DG CHEST PORT 1 VIEW Result Date: 07/17/2023 CLINICAL DATA:   Acute respiratory failure.  Hypoxia. EXAM: PORTABLE CHEST 1 VIEW COMPARISON:  07/13/2023 FINDINGS: A feeding tube passes into the stomach although the distal tip position is not included on the film. Right IJ central line tip overlies the proximal SVC level. Low volume film with interval development of vascular congestion basilar predominant interstitial opacity, concerning for edema. Probable small left effusion. Cardiopericardial silhouette is at upper limits of normal for size. Degenerative changes noted in both shoulders. Telemetry leads overlie the chest. IMPRESSION: Low volume film with interval development of vascular congestion and basilar predominant interstitial opacity, concerning for edema. Probable small left effusion. Electronically Signed   By: Donnal Fusi M.D.   On: 07/17/2023 07:41   DG Chest Port 1 View Result Date: 07/13/2023 CLINICAL DATA:  Respiratory failure EXAM: PORTABLE CHEST 1 VIEW COMPARISON:  07/09/2023 FINDINGS: Support apparatus: Endotracheal tube terminates 5.9 cm above carina. Right IJ Cordis sheath unchanged. Feeding tube extends beyond the inferior aspect of the film. Heart/mediastinum: Midline trachea.  Normal heart size. Pleura: Possible trace left pleural fluid.  No pneumothorax. Lungs: Persistent left and increased right base airspace disease. Other: Right glenohumeral joint degenerative change. IMPRESSION: Similar left and increased right base airspace disease, favoring atelectasis. Possible trace left pleural fluid. Electronically Signed   By: Lore Rode M.D.   On: 07/13/2023 09:39   EEG adult Result Date: 07/11/2023 Arleene Lack, MD     07/11/2023  3:28 PM Patient Name: Frank [name]. MRN: [id number] Epilepsy Attending: Arleene Lack Referring Physician/Provider: Casimiro Cleaves, PA-C Date: 07/11/2023 Duration: 24.55 mins Patient history: 58yo M with ams. EEG to evaluate for seizure Level of alertness:  comatose/ lethargic AEDs during EEG study: VPA Technical  aspects: This EEG study was done with scalp electrodes positioned according to the 10-20 International system of electrode placement. Electrical activity was reviewed with band pass filter of 1-70Hz , sensitivity of 7 uV/mm, display speed of 71mm/sec with a 60Hz  notched filter applied as appropriate. EEG data were recorded continuously and digitally stored.  Video monitoring was available and reviewed as appropriate. Description: EEG showed continuous generalized 3-5 hz theta-delta slowing, at times with triphasic morphology.  Hyperventilation and photic stimulation were not performed.   ABNORMALITY - Continuous slow, generalized IMPRESSION: This study is suggestive of moderate to severe encephalopathy, could be secondary to toxic metabolic causes. No seizures or epileptiform discharges were seen throughout the recording. Arleene Lack   DG Chest 1 View Result Date: 07/09/2023 CLINICAL DATA:  Status post central line placement EXAM: PORTABLE CHEST 1 VIEW COMPARISON:  07/08/2023 FINDINGS: Endotracheal tube and feeding catheter are noted in satisfactory position. New right jugular catheter is seen in satisfactory position. No pneumothorax is noted. The overall inspiratory effort is poor with crowding of the vascular markings. IMPRESSION: Poor inspiratory effort. No pneumothorax following central line placement. Electronically Signed   By: Violeta Grey M.D.   On: 07/09/2023 11:19   CT ABDOMEN PELVIS WO CONTRAST Result Date: 07/08/2023 CLINICAL DATA:  Palpable right groin mass. Anemia requiring 3 units of packed red blood cells. Sepsis due to acquired pneumonia. Lytic bone lesions with a clinical concern for a new diagnosis of multiple myeloma. Type 2 diabetes. EXAM: CT ABDOMEN AND PELVIS WITHOUT CONTRAST TECHNIQUE: Multidetector CT imaging of the abdomen and pelvis  was performed following the standard protocol without IV contrast. RADIATION DOSE REDUCTION: This exam was performed according to the departmental  dose-optimization program which includes automated exposure control, adjustment of the mA and/or kV according to patient size and/or use of iterative reconstruction technique. COMPARISON:  CT-guided right iliac bone marrow aspiration and core biopsy dated 07/04/2023. FINDINGS: Lower chest: Multifocal patchy opacities in both lower lobes. Small left pleural effusion and minimal right pleural effusion. Borderline enlarged heart. Hepatobiliary: Normal-appearing liver. Dependent calcification in the gallbladder without gallbladder wall thickening or pericholecystic fluid. Pancreas: Unremarkable. No pancreatic ductal dilatation or surrounding inflammatory changes. Spleen: Normal in size without focal abnormality. Adrenals/Urinary Tract: Foley catheter in the urinary bladder with no significant urine in the bladder. Single tiny calculus in each kidney. Small, exophytic high density left renal cyst compatible with a hemorrhagic or proteinaceous cyst. Probable subcentimeter exophytic left renal cyst, difficult to assess due to streak artifacts produced by the patient's left arm. This could be assessed with renal ultrasound. Unremarkable adrenal glands and ureters. Stomach/Bowel: Stomach is within normal limits. Appendix appears normal. No evidence of bowel wall thickening, distention, or inflammatory changes. Vascular/Lymphatic: Atheromatous arterial calcifications without aneurysm. No enlarged lymph nodes. Reproductive: Normal-sized prostate gland containing coarse calcifications. Other: Large right buttock and groin hematoma containing a fluid/hematocrit level. This measures 36.0 x 11.4 x 9.7 cm in maximum dimensions on sagittal and coronal images 32/7 and 39/6 respectively. Musculoskeletal: Left hip prosthesis with associated streak artifacts. Nondisplaced right 10th posterolateral rib fracture. Old, healed left posterolateral 10th rib fracture. Multiple small to medium-sized lytic lesions in multiple lumbar and lower  thoracic vertebral bodies and in the pelvic bones bilaterally. Moderate right hip degenerative changes. IMPRESSION: 1. Large acute right buttock and groin hematoma measuring 36.0 x 11.4 x 9.7 cm. 2. Nondisplaced right 10th posterolateral rib fracture. 3. Multiple small to medium-sized lytic lesions in multiple lumbar and lower thoracic vertebral bodies and in the pelvic bones bilaterally, compatible with the clinical suspicion of multiple myeloma. Lytic metastases can also have this appearance. 4. Multifocal patchy opacities in both lower lobes, compatible with known pneumonia. 5. Small left pleural effusion and minimal right pleural effusion. 6. Cholelithiasis. 7. Tiny bilateral renal calculi. 8. Probable subcentimeter exophytic left renal cyst, difficult to assess due to streak artifacts produced by the patient's left arm. This could be assessed with renal ultrasound. Electronically Signed   By: Catherin Closs M.D.   On: 07/08/2023 16:07   DG CHEST PORT 1 VIEW Result Date: 07/08/2023 CLINICAL DATA:  [id number] Endotracheally intubated [id number] EXAM: PORTABLE CHEST 1 VIEW COMPARISON:  07/08/2023 FINDINGS: The endotracheal tube is positioned just to the left of midline, likely within the mid trachea, likely due to patient rotation. Mild cardiomegaly. No focal airspace consolidation, pleural effusion, or pneumothorax. No cardiomegaly. No acute fracture or destructive lesion. Moderate osteoarthritis of the right shoulder. Multilevel degenerative disc disease of the spine. Weighted feeding tube courses below the diaphragm with the distal tip not included in the field of view. IMPRESSION: 1. The endotracheal tube is positioned just to the left of midline, likely within the mid trachea, but abnormally positioned due to patient rotation. No pneumothorax. 2. Weighted feeding tube courses below the diaphragm, distal tip outside the field of view. Electronically Signed   By: Rance Burrows M.D.   On: 07/08/2023 14:02   DG  CHEST PORT 1 VIEW Result Date: 07/08/2023 CLINICAL DATA:  Dyspnea. EXAM: PORTABLE CHEST 1 VIEW COMPARISON:  07/06/2023 FINDINGS: Feeding tube extends down  the esophagus into the stomach. Tube appears to be coiled in the stomach but the tip is not clearly identified. Heart size is within normal limits and stable. Prominent vascular and interstitial lung markings are unchanged. No focal airspace disease or lung consolidation. Again noted is severe joint space narrowing and probable degenerative changes in both shoulders. Trachea is midline. IMPRESSION: 1. Again noted are prominent vascular and interstitial lung markings. Findings could represent vascular congestion or mild edema. Minimal change since 07/06/2023. 2. Feeding tube extends into the abdomen. Electronically Signed   By: Elene Griffes M.D.   On: 07/08/2023 10:25   DG Abd 1 View Result Date: 07/07/2023 CLINICAL DATA:  Nasogastric tube placement. EXAM: ABDOMEN - 1 VIEW COMPARISON:  None Available. FINDINGS: Weighted enteric tube tip in the left upper abdomen in the region of the mid or distal stomach. No bowel dilatation in the included upper abdomen. IMPRESSION: Weighted enteric tube tip in the mid or distal stomach. Electronically Signed   By: Chadwick Colonel M.D.   On: 07/07/2023 17:08   DG Fluoro Rm 1-60 Min - No Report Result Date: 07/07/2023 CLINICAL DATA:  Acute encephalopathy, nasogastric tube placement requested. EXAM: FLUORO RM 1-60 MIN-NO REPORT FLUOROSCOPY: Fluoroscopy Time:  0 minutes 48 seconds Radiation Exposure Index (if provided by the fluoroscopic device): 7.0 mGy Number of Acquired Spot Images: 1 COMPARISON:  None Available. FINDINGS: Multiple attempts at nasogastric tube placement with a 20 French catheter resulted in right or left mainstem bronchus positioning. Patient was unable to swallow or follow directions to aid with insertion. Study was terminated. IMPRESSION: Unsuccessful nasogastric tube placement under fluoroscopy.  Electronically Signed   By: Shearon Denis M.D.   On: 07/07/2023 14:13   ECHOCARDIOGRAM COMPLETE Result Date: 07/07/2023    ECHOCARDIOGRAM REPORT   Patient Name:   Frank [name]. Date of Exam: 07/07/2023 Medical Rec #:  [id number]          Height:       74.0 in Accession #:    [id number]         Weight:       236.6 lb Date of Birth:  May 27, 1965          BSA:          2.334 m Patient Age:    58 years           BP:           141/88 mmHg Patient Gender: M                  HR:           110 bpm. Exam Location:  Inpatient Procedure: 3D Echo, Cardiac Doppler, Color Doppler and Intracardiac            Opacification Agent (Both Spectral and Color Flow Doppler were            utilized during procedure). Indications:    Atrial fibrillation  History:        Patient has no prior history of Echocardiogram examinations.  Sonographer:    Juanita Shaw Referring Phys: [id number] HYQMVH PARKER  Sonographer Comments: Image acquisition challenging due to patient body habitus and Image acquisition challenging due to respiratory motion. IMPRESSIONS  1. Left ventricular ejection fraction, by estimation, is 65 to 70%. The left ventricle has normal function. The left ventricle has no regional wall motion abnormalities. Left ventricular diastolic parameters are indeterminate.  2. Right ventricular systolic function is normal. The right  ventricular size is normal. There is normal pulmonary artery systolic pressure. The estimated right ventricular systolic pressure is 35.8 mmHg.  3. Left atrial size was moderately dilated.  4. The mitral valve is normal in structure. No evidence of mitral valve regurgitation. No evidence of mitral stenosis.  5. The aortic valve is tricuspid. Aortic valve regurgitation is not visualized. Aortic valve sclerosis/calcification is present, without any evidence of aortic stenosis. Aortic valve mean gradient measures 8.0 mmHg.  6. Aortic dilatation noted. There is mild dilatation of the aortic root, measuring 39  mm.  7. The inferior vena cava is dilated in size with <50% respiratory variability, suggesting right atrial pressure of 15 mmHg.  8. The patient was in atrial fibrillation. FINDINGS  Left Ventricle: Left ventricular ejection fraction, by estimation, is 65 to 70%. The left ventricle has normal function. The left ventricle has no regional wall motion abnormalities. The left ventricular internal cavity size was normal in size. There is  no left ventricular hypertrophy. Left ventricular diastolic parameters are indeterminate. Right Ventricle: The right ventricular size is normal. No increase in right ventricular wall thickness. Right ventricular systolic function is normal. There is normal pulmonary artery systolic pressure. The tricuspid regurgitant velocity is 2.28 m/s, and  with an assumed right atrial pressure of 15 mmHg, the estimated right ventricular systolic pressure is 35.8 mmHg. Left Atrium: Left atrial size was moderately dilated. Right Atrium: Right atrial size was normal in size. Pericardium: There is no evidence of pericardial effusion. Mitral Valve: The mitral valve is normal in structure. No evidence of mitral valve regurgitation. No evidence of mitral valve stenosis. MV peak gradient, 5.4 mmHg. The mean mitral valve gradient is 2.0 mmHg. Tricuspid Valve: The tricuspid valve is normal in structure. Tricuspid valve regurgitation is trivial. Aortic Valve: The aortic valve is tricuspid. Aortic valve regurgitation is not visualized. Aortic valve sclerosis/calcification is present, without any evidence of aortic stenosis. Aortic valve mean gradient measures 8.0 mmHg. Aortic valve peak gradient measures 16.0 mmHg. Aortic valve area, by VTI measures 2.25 cm. Pulmonic Valve: The pulmonic valve was normal in structure. Pulmonic valve regurgitation is not visualized. Aorta: Aortic dilatation noted. There is mild dilatation of the aortic root, measuring 39 mm. Venous: The inferior vena cava is dilated in size  with less than 50% respiratory variability, suggesting right atrial pressure of 15 mmHg. IAS/Shunts: No atrial level shunt detected by color flow Doppler.  LEFT VENTRICLE PLAX 2D LVIDd:         5.00 cm      Diastology LVIDs:         3.60 cm      LV e' medial:    7.94 cm/s LV PW:         0.60 cm      LV E/e' medial:  17.0 LV IVS:        0.90 cm      LV e' lateral:   12.90 cm/s LVOT diam:     2.00 cm      LV E/e' lateral: 10.5 LV SV:         56 LV SV Index:   24 LVOT Area:     3.14 cm  LV Volumes (MOD) LV vol d, MOD A4C: 170.0 ml LV vol s, MOD A4C: 50.6 ml LV SV MOD A4C:     170.0 ml RIGHT VENTRICLE             IVC RV Basal diam:  4.20 cm  IVC diam: 2.50 cm RV Mid diam:    2.80 cm RV S prime:     22.40 cm/s TAPSE (M-mode): 2.0 cm LEFT ATRIUM              Index        RIGHT ATRIUM           Index LA diam:        4.20 cm  1.80 cm/m   RA Area:     17.80 cm LA Vol (A2C):   106.0 ml 45.41 ml/m  RA Volume:   43.80 ml  18.76 ml/m LA Vol (A4C):   86.7 ml  37.14 ml/m LA Biplane Vol: 101.0 ml 43.27 ml/m  AORTIC VALVE                     PULMONIC VALVE AV Area (Vmax):    2.04 cm      PV Vmax:       1.42 m/s AV Area (Vmean):   2.04 cm      PV Peak grad:  8.1 mmHg AV Area (VTI):     2.25 cm AV Vmax:           200.00 cm/s AV Vmean:          125.000 cm/s AV VTI:            0.248 m AV Peak Grad:      16.0 mmHg AV Mean Grad:      8.0 mmHg LVOT Vmax:         130.00 cm/s LVOT Vmean:        81.200 cm/s LVOT VTI:          0.178 m LVOT/AV VTI ratio: 0.72  AORTA Ao Root diam: 3.90 cm Ao Asc diam:  3.30 cm MITRAL VALVE                TRICUSPID VALVE MV Area (PHT): 5.70 cm     TR Peak grad:   20.8 mmHg MV Area VTI:   2.31 cm     TR Vmax:        228.00 cm/s MV Peak grad:  5.4 mmHg MV Mean grad:  2.0 mmHg     SHUNTS MV Vmax:       1.16 m/s     Systemic VTI:  0.18 m MV Vmean:      65.4 cm/s    Systemic Diam: 2.00 cm MV Decel Time: 133 msec MV E velocity: 135.00 cm/s Dalton McleanMD Electronically signed by Archer Bear  Signature Date/Time: 07/07/2023/9:53:50 AM    Final    DG CHEST PORT 1 VIEW Result Date: 07/06/2023 CLINICAL DATA:  Weakness altered EXAM: PORTABLE CHEST 1 VIEW COMPARISON:  07/05/2023 FINDINGS: Hypoventilatory changes. Cardiomegaly with mild central congestion. No consolidation, pleural effusion, or pneumothorax IMPRESSION: Cardiomegaly with mild central congestion. Electronically Signed   By: Esmeralda Hedge M.D.   On: 07/06/2023 18:17   DG CHEST PORT 1 VIEW Result Date: 07/05/2023 CLINICAL DATA:  Dyspnea EXAM: PORTABLE CHEST 1 VIEW COMPARISON:  07/04/2023 FINDINGS: Hypoventilatory change. Cardiomegaly with mild central congestion. No pleural effusion or pneumothorax. Advanced degenerative change of the shoulders. IMPRESSION: Cardiomegaly with mild central congestion. Electronically Signed   By: Esmeralda Hedge M.D.   On: 07/05/2023 16:06   DG CHEST PORT 1 VIEW Result Date: 07/04/2023 CLINICAL DATA:  Hypertension and dyspnea, generalized weakness, AKI EXAM: PORTABLE CHEST 1 VIEW COMPARISON:  None are available. There is a report from radiographs  12/14/2018 FINDINGS: Borderline cardiomegaly. Diffuse interstitial coarsening and airspace opacities bilaterally. No pleural effusion or pneumothorax. No displaced rib fractures. Advanced arthritis both shoulders. IMPRESSION: Diffuse interstitial coarsening and airspace opacities bilaterally, likely edema. Atypical infection could appear similarly. Electronically Signed   By: Rozell Cornet M.D.   On: 07/04/2023 22:21   CT BONE MARROW BIOPSY & ASPIRATION Result Date: 07/04/2023 INDICATION: [id number] Myeloma (HCC) [id number] EXAM: CT GUIDED BONE MARROW ASPIRATION AND CORE BIOPSY MEDICATIONS: None. ANESTHESIA/SEDATION: Moderate (conscious) sedation was employed during this procedure. A total of Versed  2 mg and Fentanyl  50 mcg was administered intravenously. Moderate Sedation Time: 16 minutes. The patient's level of consciousness and vital signs were monitored continuously  by radiology nursing throughout the procedure under my direct supervision. FLUOROSCOPY TIME:  CT dose; 314 mGycm COMPLICATIONS: None immediate. Estimated blood loss: <5 mL PROCEDURE: RADIATION DOSE REDUCTION: This exam was performed according to the departmental dose-optimization program which includes automated exposure control, adjustment of the mA and/or kV according to patient size and/or use of iterative reconstruction technique. Informed written consent was obtained from the patient and/or patient's representative after a thorough discussion of the procedural risks, benefits and alternatives. All questions were addressed. Maximal Sterile Barrier Technique was utilized including caps, mask, sterile gowns, sterile gloves, sterile drape, hand hygiene and skin antiseptic. A timeout was performed prior to the initiation of the procedure. The patient was positioned prone and non-contrast localization CT was performed of the pelvis to demonstrate the iliac marrow spaces. Under sterile conditions and local anesthesia, an 11 gauge coaxial bone biopsy needle was advanced into the RIGHT iliac marrow space. Needle position was confirmed with CT imaging. Initially, bone marrow aspiration was performed. Next, the 11 gauge outer cannula was utilized to obtain a 1 iliac bone marrow core biopsy. Needle was removed. Hemostasis was obtained with compression. The patient tolerated the procedure well. Samples were prepared with the cytotechnologist. IMPRESSION: Successful CT-guided bone marrow aspiration and biopsy. Art Largo, MD [facility] Electronically Signed   By: Art Largo M.D.   On: 07/04/2023 12:21   ADDENDUM: I see that he still has some temperatures.  I do not know if this might be related to medications.  I have just never seen a patient with myeloma unless there is any obvious infection that is having temperatures.[name]  His white cell count is trending down  a little bit.  His white count is 14.9.  Hemoglobin 7.9.  Platelet count 290,000.  Again his cultures have all been negative.  His protein still is quite low.  His response to 1 cycle of chemotherapy has been very nice.  Hopefully, he will continue to maintain his myeloma levels at low level.  I still know that we will have to give him a treatment down the road.  However, I think we can still hold off for right now.  [name], MD  Romans 8:18

## 2023-07-30 NOTE — Progress Notes (Signed)
   07/30/23 1214  Vitals  Temp 99.3 F (37.4 C)  Temp Source Axillary  BP (!) 142/89  MAP (mmHg) 106  BP Location Right Arm  BP Method Automatic  Patient Position (if appropriate) Lying  Pulse Rate (!) 104  Pulse Rate Source Monitor  ECG Heart Rate (!) 109  Resp 15  Oxygen Therapy  SpO2 98 %  During Treatment Monitoring  Arterial Pressure (mmHg) 32.93 mmHg  Venous Pressure (mmHg) -148.88 mmHg  TMP (mmHg) 14.95 mmHg  Ultrafiltration Rate (mL/min) 310 mL/min  Dialysate Flow Rate (mL/min) 299 ml/min  Dialysate Potassium Concentration 3  Dialysate Calcium  Concentration 2.5  Duration of HD Treatment -hour(s) 3.25 hour(s)  Cumulative Fluid Removed (mL) per Treatment  1000.06  Post Treatment  Dialyzer Clearance Lightly streaked  Hemodialysis Intake (mL) 0 mL  Liters Processed 58.6  Fluid Removed (mL) 1000 mL  Tolerated HD Treatment Yes  Post-Hemodialysis Comments no s/s of distress currently on trach collar being weaned  Note  Patient Observations alert with eyes opened  Hemodialysis Catheter Right Internal jugular Triple lumen Temporary (Non-Tunneled)  Placement Date/Time: 07/09/23 0900   Local Anesthetic: Topical  Ultrasound Used?: Yes  Verification by X-ray: Yes  Person Inserting LDA: Evone Hoh, CCM PA  Orientation: Right  Access Location: Internal jugular  Hemodialysis Catheter Type: Triple lumen ...  Site Condition No complications  Blue Lumen Status Flushed;Heparin  locked  Red Lumen Status Flushed;Heparin  locked  Purple Lumen Status Infusing  Catheter fill solution Heparin  1000 units/ml  Catheter fill volume (Arterial) 1.2 cc  Catheter fill volume (Venous) 1.2  Dressing Type Transparent  Dressing Status Antimicrobial disc/dressing in place  Drainage Description None  Post treatment catheter status Capped and Clamped    Alert Informed consent signed and in chart.   TX duration:3:15  Patient tolerated well.   Alert, without acute distress.  Hand-off given to  patient's nurse.   Access used: Rt temp. IJ Access issues: none  Total UF removed: 1000 Medication(s) given: none Post HD VS: see  above Post HD weight: 99.0kg   [facility]

## 2023-07-30 NOTE — Progress Notes (Signed)
CPT held at this time due to patient receiving dialysis.

## 2023-07-30 NOTE — Progress Notes (Addendum)
 SLP Cancellation and Discharge Note  Patient Details Name: Frank [name]. MRN: [id number] DOB: 10-20-1965   Pt not appropriate for PMV or swallow evals.  SLP will sign off.  Please re-consult should condition warrant our intervention.  [name], MA CCC/SLP Clinical Specialist - Acute Care SLP Acute Rehabilitation Services Office number [phone]          [name] 07/30/2023, 9:02 AM

## 2023-07-30 NOTE — Progress Notes (Signed)
 NAME:  Frank Brolin., MRN:  [id number], DOB:  [birth year], LOS: 27 ADMISSION DATE:  07/03/2023, CONSULTATION DATE:  07/06/23 REFERRING MD:  Dr Maury Space, CHIEF COMPLAINT:  Encephalopathy   History of Present Illness:   PCCM asked to see patient for encephalopathy   Transferred from [facility] with 1 week of generalized weakness, altered mentation, decreased appetite Recently being worked up for multiple myeloma.  Transferred to [facility] for oncology evaluation Nephrology consulted for AKI   Background history of obesity, asthma, obstructive sleep apnea on CPAP GERD, hypertension, hyperlipidemia Has been feeling weak for the last few days according to spouse Admitted with concern for sepsis, community-acquired pneumonia, antibiotic encephalopathy Workup revealed lytic bony lesions with concern for multiple myeloma Transfused with 3 units packed red blood cells Evaluation so far-bone marrow 07/04/2023  Pertinent  Medical History   Past Medical History:  Diagnosis Date   Anxiety    Arthritis    Asthma    Bipolar disorder (HCC)    Current every day smoker    Depression    Diabetes mellitus, type II (HCC)    Dyspnea    History of kidney stones    History of pneumonia    Hyperlipidemia    Hypertension    Morbid obesity (HCC)    Sedentary lifestyle    Seizures (HCC)    last seizure 12 yrs ago, no current problem   Sleep apnea    uses CPAP nightly   Significant Hospital Events: Including procedures, antibiotic start and stop dates in addition to other pertinent events   4/19-blood cultures, MRSA PCR negative 4/19 chest x-ray with mild pulmonary congestion 4/20 atrial fibrillation with RVR 4/21 nosebleed after IR NG attempt, NG placed by PCCM in afternoon, stable transferred to [facility] 4/22 intubated; some abla requiring prbcs; ct abd/pelvis showing acute groin hematoma tracking from posterior presumed from site of bone marrow biopsy; heparin  held EEG 4/25 >> moderate to severe  encephalopathy without any seizures or epileptiform discharges 4/23 respiratory culture >> MRSE 4/28 remains intubated on CRRT, down to one pressor  4/29 labile BP, NE back up. Responded a bit to fluids. Received Velcade  and Cytoxan  chemotherapy 5/1 remains encephalopathic, check CTH 5/2 LP ETT exchange  5/5 1 PRBC. Palliative care met w family  5/6 IVIG. plan for trach established. Added midodrine  after failed vaso wean. Abx/fungal changed to levaquin   5/7 weaning pressors incr midodrine  incr lactulose . Vaso off  5/8 Trach placed. Some post op bleeding improved with thrombi-pad 5/10 voriconazole  added back based on CT Chest findings, MRI brain with no acute changes 5/13 more alert and able to follow some commands today   Interim History / Subjective:   Tmax 101.8 Weaning off sedation, off Precedex , off fentanyl  Appears to follow more commands  Objective   Blood pressure (!) 144/81, pulse (!) 104, temperature 99.3 F (37.4 C), temperature source Axillary, resp. rate 16, height 6' 0.01" (1.829 m), weight 100 kg, SpO2 97%.    Vent Mode: PRVC FiO2 (%):  [30 %-50 %] 50 % Set Rate:  [20 bmp] 20 bmp Vt Set:  [540 mL] 540 mL PEEP:  [5 cmH20] 5 cmH20 Plateau Pressure:  [14 cmH20-17 cmH20] 16 cmH20   Intake/Output Summary (Last 24 hours) at 07/30/2023 1542 Last data filed at 07/30/2023 1400 Gross per 24 hour  Intake 2505.01 ml  Output 2050 ml  Net 455.01 ml   Filed Weights   07/27/23 0700 07/29/23 0500 07/30/23 0827  Weight: 105.1 kg 100 kg  100 kg     Physical Exam: General: Chronically ill-appearing, moist oral mucosa HEENT: Tracheostomy tube in place, no significant secretions Neuro: Patient is moving all extremities, will follow commands intermittently CV: S1-S2 appreciated PULM: Coarse breath sounds at the bases GI: Bowel sounds appreciated Extremities: Trace bilateral lower extremity edema  __________________________________________________________  Micro: 4/28 BCx >  no growth, final 4/30 respiratory culture-few PMN, rare budding yeast, abundant GPC> few aspergillus fumigatus Quantiferon 5/2 > neg Trach asp 5/4> rare GPC, rare mold  PCP smear trach asp 5/5 > neg  Trach asp 5/9 > abundant staph hemolyticus, few budding yeast  CSF studies 1 RBC, 1 WBC, glucose 106 CSF culture-2 organisms acute, rare WBC> NGTD Cryptococcal antigen negative meningitis and encephalitis panel negative VDRL  non-reactive   AFB CSF 5/2 >>>>  BUN 90, creatinine 4.11 Albumin  less than 1.5  Reviewed CT scan of the chest 07/26/2023 Nodular densities, groundglass infiltration  Blood cultures 5/11-no growth  Respiratory culture 5/9 Abundant Staphylococcus hemolyticus, few budding yeast seen  Voriconazole  Vancomycin   Resolved Problems  Shock  Assessment & Plan:   Metabolic encephalopathy Seizure disorder Hyperammonemia - Continue delirium precautions - Continue lactulose  - On home Paxil  and gabapentin  - BP on hold  Multiple myeloma Received Velcade  and Cytoxan  07/08/2023, second dose 07/11/2023, received high-dose steroids - S/p IVIG, s/p G-CSF for neutropenia -Chemotherapy on hold at present  Acute hypoxemic respiratory failure Aspergillus pneumonia Bilateral pleural effusions History of obstructive sleep apnea History of tobacco abuse - Tolerating weaning -Completed Levaquin  - Appreciate infectious disease follow-up  Leukocytosis Sepsis - On vancomycin  - On voriconazole   Acute kidney injury on chronic kidney disease stage IIIa CRRT was stopped on 5/10 - Now on intermittent hemodialysis  Paroxysmal atrial fibrillation - Now in sinus rhythm - Consider anticoagulation should he go back into atrial fibrillation - Continue amiodarone  200 per tube, to discontinue 5/20  Multifactorial anemia Thrombocytopenia - Appears to be stabilizing - Will continue to monitor  Type 2 diabetes - On glargine  Following phosphorus levels -Appears to have  had a falsely elevated level on 5/13  - Uric acid ordered  Appreciate palliative care involvement regarding goals of care discussions  GOC: See IPAL from 5/7. Wife now updating goals to include 2-3 rounds of CPR maximum should he arrest.  Best Practice (right click and "Reselect all SmartList Selections" daily)   Diet/type: tubefeeds  DVT prophylaxis prophylactic heparin   Pressure ulcer(s): identified on: 19/Apr/2025 GI prophylaxis: H2B Lines: Dialysis Catheter Foley:  na  Code Status:  full code Last date of multidisciplinary goals of care discussion d/w wife 5/7. See IPAL  The patient is critically ill with multiple organ systems failure and requires high complexity decision making for assessment and support, frequent evaluation and titration of therapies, application of advanced monitoring technologies and extensive interpretation of multiple databases. Critical Care Time devoted to patient care services described in this note independent of APP/resident time (if applicable)  is 33 minutes.   Myer Artis MD [place] Personal pager: See Amion If unanswered, please page CCM On-call: #[phone]

## 2023-07-30 NOTE — Progress Notes (Signed)
 Wife brought patient's arm brace to bedside along with pictures of family and teddy bear.

## 2023-07-30 NOTE — Progress Notes (Signed)
 [facility] NEPHROLOGY PROGRESS NOTE   Subjective:  Seen in ICU Off pressors today Making eye contact, nods his head   Objective Vital signs in last 24 hours: Vitals:   07/26/23 1100 07/26/23 1125 07/26/23 1130 07/26/23 1200  BP: (!) 179/96 (!) 146/85 115/72 (!) 83/50  Pulse: (!) 105 96 93 76  Resp: 16 20 20 20   Temp:    99.8 F (37.7 C)  TempSrc:    Axillary  SpO2: 100% 98% 98% 99%  Weight:      Height:        Physical Exam: General: Critically ill looking male, intubated/ trach'd Heart:RRR, s1s2 nl Lungs: Coarse breath sound bilateral on vent Abdomen:soft, Non-tender, non-distended Extremities:No edema, LUE more tense now with 1+ pitting edema, R trace  Neurology: responding today, on trach collar    Assessment/ Plan: Pt is a 58 y.o. yo male with past medical history significant for HTN, HLD, type II DM, BPH, seizure who was initially presented at [facility] due to AMS, admitted for sepsis due to pneumonia, encephalopathy, sepsis, AKI, hypercalcemia symptomatic anemia and Lytic pulm lesion concern for multiple myeloma.  Problems # AKI on CKD 3a - b/l creat 1.1- 1.5 from 2024, eGFR 53- >60 ml/min. Creat here 5.8 on presentation at OSH. AKI due to myeloma kidney +/- hyperCa +/- ACEi (home med). Started CRRT on 4/23. Pt remains anuric, and tolerating CRRT.  No further clotting after circuit heparin  increased 5/6.  Pressors weaned off. Remained hemodynamically stable and CRRT dc'd 5/10 and pt moved to [facility] for iHD.  Is on MWF schedule for now. HD today.   # AMS/acute metabolic encephalopathy: per primary, seems to be protracted.  MRI, LP completed and unrevealing.   #Volume - appearing euvolemic now, wt's are stable. IV drips are minimal w/ fentanyl  at 20 cc/ hr and TF's at 50 cc/hr.   # Shock- off pressors, cont midodrine  10 tid now.   # Multiple myeloma: Oncologist started Velcade  and Cytoxan , per oncology team.   On hold for now given ongoing  critical illness.  #AHRF: s/p trach 5/8, per CCM  # Atrial fib-  on amio  #Anemia - prn transfusions. On epogen  per onc.     Frank Poag  MD  [facility] 07/26/2023, 1:31 PM  Recent Labs  Lab 07/24/23 0421 07/24/23 1630 07/25/23 1508 07/26/23 0423  HGB 8.9*  --   --  8.9*  ALBUMIN  1.6*   < > 1.6* 1.8*  1.6*  CALCIUM  7.4*   < > 7.7* 7.7*  7.7*  PHOS 2.0*  1.5*   < > RESULTS UNAVAILABLE DUE TO INTERFERING SUBSTANCE 1.9*  1.5*  CREATININE 1.59*   < > 1.62* 1.77*  1.74*  K 4.3   < > 4.6 4.4  4.5   < > = values in this interval not displayed.    Inpatient medications:  amiodarone   200 mg Per NG tube Daily   arformoterol   15 mcg Nebulization BID   busPIRone   15 mg Per Tube QHS   clonazePAM   1 mg Per Tube BID   docusate  100 mg Per Tube BID   epoetin  alfa  40,000 Units Subcutaneous QODAY   famotidine   10.4 mg Per Tube Daily   feeding supplement (PROSource TF20)  60 mL Per Tube TID   folic acid   1 mg Per Tube Daily   gabapentin   100 mg Per Tube Q12H   heparin  injection (subcutaneous)  5,000 Units Subcutaneous Q8H   insulin   aspart  0-20 Units Subcutaneous Q4H   insulin  aspart  8 Units Subcutaneous Q4H   insulin  glargine-yfgn  10 Units Subcutaneous BID   lactulose   20 g Per Tube TID   leptospermum manuka honey  1 Application Topical Daily   lidocaine -EPINEPHrine   20 mL Intradermal Once   midodrine   10 mg Per Tube TID WC   nicotine   7 mg Transdermal Daily   mouth rinse  15 mL Mouth Rinse Q2H   PARoxetine   40 mg Per Tube Daily   pneumococcal 20-valent conjugate vaccine  0.5 mL Intramuscular Tomorrow-1000   polyethylene glycol  17 g Per Tube Daily   QUEtiapine   50 mg Per Tube QHS   revefenacin   175 mcg Nebulization Daily   sodium chloride  flush  10-40 mL Intracatheter Q12H   sodium chloride  HYPERTONIC  4 mL Nebulization Daily   thiamine   100 mg Per Tube Daily    ampicillin-sulbactam (UNASYN) IV Stopped (07/26/23 8119)   dexmedetomidine  (PRECEDEX ) IV infusion 1.2 mcg/kg/hr  (07/26/23 1300)   feeding supplement (VITAL 1.5 CAL) 60 mL/hr at 07/26/23 1300   fentaNYL  infusion INTRAVENOUS 200 mcg/hr (07/26/23 1300)   levofloxacin  (LEVAQUIN ) IV 100 mL/hr at 07/26/23 1300   midazolam      norepinephrine  (LEVOPHED ) Adult infusion 2 mcg/min (07/26/23 1300)   sodium PHOSPHATE  IVPB (in mmol)     voriconazole  Stopped (07/26/23 1225)   Followed by   [name] ON 07/27/2023] voriconazole      acetaminophen  (TYLENOL ) oral liquid 160 mg/5 mL **OR** acetaminophen , fentaNYL , ipratropium-albuterol , midazolam , ondansetron  (ZOFRAN ) IV, mouth rinse, oxyCODONE , polyethylene glycol, polyvinyl alcohol , sodium chloride  flush

## 2023-07-31 DIAGNOSIS — J9601 Acute respiratory failure with hypoxia: Secondary | ICD-10-CM | POA: Diagnosis not present

## 2023-07-31 DIAGNOSIS — J90 Pleural effusion, not elsewhere classified: Secondary | ICD-10-CM | POA: Diagnosis not present

## 2023-07-31 DIAGNOSIS — Z87891 Personal history of nicotine dependence: Secondary | ICD-10-CM | POA: Diagnosis not present

## 2023-07-31 DIAGNOSIS — B441 Other pulmonary aspergillosis: Secondary | ICD-10-CM | POA: Diagnosis not present

## 2023-07-31 LAB — CBC WITH DIFFERENTIAL/PLATELET
Abs Immature Granulocytes: 1.7 10*3/uL — ABNORMAL HIGH (ref 0.00–0.07)
Basophils Absolute: 0.3 10*3/uL — ABNORMAL HIGH (ref 0.0–0.1)
Basophils Relative: 2 %
Eosinophils Absolute: 0.3 10*3/uL (ref 0.0–0.5)
Eosinophils Relative: 2 %
HCT: 29.8 % — ABNORMAL LOW (ref 39.0–52.0)
Hemoglobin: 9.3 g/dL — ABNORMAL LOW (ref 13.0–17.0)
Lymphocytes Relative: 13 %
Lymphs Abs: 2.2 10*3/uL (ref 0.7–4.0)
MCH: 31.2 pg (ref 26.0–34.0)
MCHC: 31.2 g/dL (ref 30.0–36.0)
MCV: 100 fL (ref 80.0–100.0)
Metamyelocytes Relative: 7 %
Monocytes Absolute: 0.7 10*3/uL (ref 0.1–1.0)
Monocytes Relative: 4 %
Myelocytes: 3 %
Neutro Abs: 11.6 10*3/uL — ABNORMAL HIGH (ref 1.7–7.7)
Neutrophils Relative %: 69 %
Platelets: 313 10*3/uL (ref 150–400)
RBC: 2.98 MIL/uL — ABNORMAL LOW (ref 4.22–5.81)
RDW: 23.2 % — ABNORMAL HIGH (ref 11.5–15.5)
WBC: 16.8 10*3/uL — ABNORMAL HIGH (ref 4.0–10.5)
nRBC: 6 /100{WBCs} — ABNORMAL HIGH
nRBC: 7.5 % — ABNORMAL HIGH (ref 0.0–0.2)

## 2023-07-31 LAB — COMPREHENSIVE METABOLIC PANEL WITH GFR
ALT: 10 U/L (ref 0–44)
AST: 16 U/L (ref 15–41)
Albumin: 1.9 g/dL — ABNORMAL LOW (ref 3.5–5.0)
Alkaline Phosphatase: 90 U/L (ref 38–126)
Anion gap: 11 (ref 5–15)
BUN: 70 mg/dL — ABNORMAL HIGH (ref 6–20)
CO2: 26 mmol/L (ref 22–32)
Calcium: 8.4 mg/dL — ABNORMAL LOW (ref 8.9–10.3)
Chloride: 99 mmol/L (ref 98–111)
Creatinine, Ser: 3.87 mg/dL — ABNORMAL HIGH (ref 0.61–1.24)
GFR, Estimated: 17 mL/min — ABNORMAL LOW (ref >60)
Glucose, Bld: 129 mg/dL — ABNORMAL HIGH (ref 70–99)
Potassium: 3.6 mmol/L (ref 3.5–5.1)
Sodium: 136 mmol/L (ref 135–145)
Total Bilirubin: 0.6 mg/dL (ref 0.0–1.2)
Total Protein: 8.5 g/dL — ABNORMAL HIGH (ref 6.5–8.1)

## 2023-07-31 LAB — MISC LABCORP TEST (SEND OUT)
Labcorp test code: 8474
Labcorp test code: 1024
Labcorp test code: 1024

## 2023-07-31 LAB — GLUCOSE, CAPILLARY
Glucose-Capillary: 113 mg/dL — ABNORMAL HIGH (ref 70–99)
Glucose-Capillary: 115 mg/dL — ABNORMAL HIGH (ref 70–99)
Glucose-Capillary: 122 mg/dL — ABNORMAL HIGH (ref 70–99)
Glucose-Capillary: 127 mg/dL — ABNORMAL HIGH (ref 70–99)
Glucose-Capillary: 130 mg/dL — ABNORMAL HIGH (ref 70–99)
Glucose-Capillary: 137 mg/dL — ABNORMAL HIGH (ref 70–99)
Glucose-Capillary: 137 mg/dL — ABNORMAL HIGH (ref 70–99)

## 2023-07-31 MED ORDER — MIDODRINE HCL 5 MG PO TABS
5.0000 mg | ORAL_TABLET | Freq: Three times a day (TID) | ORAL | Status: DC
  Administered 2023-07-31 – 2023-08-01 (×3): 5 mg
  Filled 2023-07-31 (×3): qty 1

## 2023-07-31 MED ORDER — VANCOMYCIN HCL IN DEXTROSE 1-5 GM/200ML-% IV SOLN
1000.0000 mg | INTRAVENOUS | Status: AC
  Administered 2023-08-01: 1000 mg via INTRAVENOUS
  Filled 2023-07-31: qty 200

## 2023-07-31 MED ORDER — LACTULOSE 10 GM/15ML PO SOLN
20.0000 g | Freq: Two times a day (BID) | ORAL | Status: DC
  Administered 2023-07-31: 20 g
  Filled 2023-07-31: qty 30

## 2023-07-31 MED ORDER — OXYCODONE HCL 5 MG PO TABS
10.0000 mg | ORAL_TABLET | Freq: Four times a day (QID) | ORAL | Status: DC
  Administered 2023-07-31 – 2023-08-04 (×16): 10 mg
  Filled 2023-07-31 (×17): qty 2

## 2023-07-31 MED ORDER — GABAPENTIN 250 MG/5ML PO SOLN
200.0000 mg | Freq: Three times a day (TID) | ORAL | Status: DC
  Administered 2023-07-31 – 2023-08-05 (×16): 200 mg
  Filled 2023-07-31 (×18): qty 4

## 2023-07-31 NOTE — Progress Notes (Addendum)
 LUA extremely swollen. Unit RN staff has not been notified that pt is restricted on RA. USPIV placed in RFA.

## 2023-07-31 NOTE — TOC Progression Note (Addendum)
 Transition of Care (TOC) - Progression Note    Patient Details  Name: Frank [name]. MRN: [id number] Date of Birth: [birth year]  Transition of Care [facility]) CM/SW Contact  Juliane Och, LCSW Phone Number: 07/31/2023, 11:32 AM  Clinical Narrative:     11:32 AM CSW informed patient's spouse, Moira Andrews, of physical therapy recommendation of discharging to SNF. CSW informed Moira Andrews of lack of SNF facilities that admit patient with trach vent and/or trach collar. Moira Andrews was agreeable to CSW contacting SNFs in [place] that admit patient with trach collar and/or trach vent and expressed interest in SNFs near [place]. CSW offered to email PDF of trach collar and trach vent SNFs in [place]. Moira Andrews accepted CSW offer, and CSW Technical sales engineer.  1:21 PM CSW sent referrals to [facility], [place] [facility], [facility], and [facility].  3:02 PM Moira Andrews called CSW expressing interest in [facility] SNF. CSW called SNF admissions who stated that they do not admit patient with trach vent or trach collar and directed CSW to their sister location also in [place], [place] SNF. CSW attempted to call [facility] admissions but there was no response and a voicemail was left.  Expected Discharge Plan: Skilled Nursing Facility Barriers to Discharge: Continued Medical Work up, SNF Pending bed offer, Vent Bed not available  Expected Discharge Plan and Services In-house Referral: Clinical Social Work Discharge Planning Services: CM Consult Post Acute Care Choice: Skilled Nursing Facility Living arrangements for the past 2 months: Single Family Home                 DME Arranged: N/A DME Agency: NA       HH Arranged: NA HH Agency: NA         Social Determinants of Health (SDOH) Interventions SDOH Screenings   Food Insecurity: No Food Insecurity (07/05/2023)  Housing: Low Risk  (07/05/2023)  Transportation Needs: No Transportation  Needs (07/05/2023)  Utilities: Not At Risk (07/05/2023)  Tobacco Use: High Risk (07/10/2023)    Readmission Risk Interventions     No data to display

## 2023-07-31 NOTE — Progress Notes (Signed)
 D'Arcy Nemiah Banister.   DOB:10-09-1965   BJ#:[id number]      ASSESSMENT & PLAN:  Mr. Nivin Salz is a 58 year old male patient transferred from [facility] mid April 2025 with approx one week history of generalized weakness, decreased appetite and altered mental status. He was admitted with concern for sepsis, PNA, and encephalopathy. Work-up was done and showed lytic lesions concerning for multiple myeloma. Oncology asked to evaluate and manage.   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 - Initiated chemotherapy with Velcade  and Cytoxan  07/08/23 with second dose given 07/11/23.  Also given high-dose steroids.   - Good response to chemotherapy, shows decreased myeloma levels.  Last myeloma levels checked 07/22/2023. - Chemo on Hold.  May restart if protein levels trend upwards.  - Status post IVIG.  S/p G-CSF for neutropenia. - Total protein 8. 5 with low albumin  1.9 - Medical oncology/Dr. Maria Shiner following closely   Acute respiratory failure Hypoxia - Status post intubation.  Now has trach intact, weaned from vent, still with thick mucus secretions.  Following simple commands. - Management per critical care   Anemia - Likely secondary to malignancy - HGB stable 9.3 - Recommend PRBC transfusion for hemoglobin <7.0 - Monitor CBC with differential closely   Leukocytosis/Sepsis Fevers -WBC 16.8 -Still with intermittent fevers, fevers not likely due to myeloma. -Continue to monitor fever curve -Continue antibiotics as ordered -Monitor BC with differential   CKD - was on CRRT, transferred to [facility] for HD, done 5/14  - Nephrology managing   Altered mental status Acute metabolic encephalopathy - Improving - Continue supportive care   Hypertension Hyperlipidemia Diabetes - Monitor blood pressure levels - Monitor blood glucose levels, insulin  coverage per protocol - Medicine  following      Code Status Full  Subjective:  Patient seen resting comfortably.  Trach intact.  Discussed with nurse who states trach during day and trach to vent at night, patient has been tolerating well.  Remains chronically ill-appearing.  No acute events overnight.  Objective:   Intake/Output Summary (Last 24 hours) at 07/31/2023 1213 Last data filed at 07/31/2023 0900 Gross per 24 hour  Intake 1696.67 ml  Output 2100 ml  Net -403.33 ml     PHYSICAL EXAMINATION: ECOG PERFORMANCE STATUS: 4 - Bedbound  Vitals:   07/31/23 1102 07/31/23 1110  BP: 128/81   Pulse: (!) 110   Resp: (!) 22   Temp:  (!) 102.3 F (39.1 C)  SpO2: 98%    Filed Weights   07/29/23 0500 07/30/23 0827 07/31/23 0547  Weight: 220 lb 7.4 oz (100 kg) 220 lb 7.4 oz (100 kg) 225 lb 8.5 oz (102.3 kg)    GENERAL: +Chronically ill-appearing SKIN: skin color, texture, turgor are normal, no rashes or significant lesions EYES: normal, conjunctiva are pink and non-injected, sclera clear OROPHARYNX: + Trach intact NECK: supple, thyroid normal size, non-tender, without nodularity LUNGS: +coarse breath sounds HEART: regular rate & rhythm and no murmurs and no lower extremity edema ABDOMEN: abdomen soft, non-tender and normal bowel sounds MUSCULOSKELETAL: Bilateral lower extremity edema 1+ NEURO: +Follow simple commands   All questions were answered. The patient knows to call the clinic with any problems, questions or concerns.   The total time spent in the appointment was 30 minutes encounter with patient including review of chart and various tests results, discussions about plan of care and coordination of care plan  Jacqualin Mate, NP  07/31/2023 12:13 PM    Labs Reviewed:  Lab Results  Component Value Date   WBC 16.8 (H) 07/31/2023   HGB 9.3 (L) 07/31/2023   HCT 29.8 (L) 07/31/2023   MCV 100.0 07/31/2023   PLT 313 07/31/2023   Recent Labs    07/23/23 0416 07/23/23 1722 07/29/23 0446  07/29/23 1635 07/30/23 0424 07/31/23 0509  NA 128*   < > 135 139 139 136  K 4.2   < > 3.6 4.2 3.7 3.6  CL 99   < > 99 100 101 99  CO2 24   < > 26 24 23 26   GLUCOSE 128*   < > 92 108* 123* 129*  BUN 33*   < > 54* 69* 83* 70*  CREATININE 1.62*   < > 3.03* 3.71* 4.40* 3.87*  CALCIUM  7.5*   < > 7.9* 8.3* 8.1* 8.4*  GFRNONAA 49*   < > 23* 18* 15* 17*  PROT 8.5*   < > 7.7  --  8.0 8.5*  ALBUMIN  <1.5*  <1.5*   < > 1.6* 1.8* 1.7* 1.9*  AST 19   < > 17  --  12* 16  ALT 14   < > 11  --  8 10  ALKPHOS 59   < > 81  --  76 90  BILITOT 0.5   < > 0.8  --  0.8 0.6  BILIDIR <0.1  --   --   --   --   --   IBILI NOT CALCULATED  --   --   --   --   --    < > = values in this interval not displayed.    Studies Reviewed:  MR BRAIN WO CONTRAST Result Date: 07/26/2023 CLINICAL DATA:  Follow-up examination for aspergillosis, altered mental status, immunosuppressed. EXAM: MRI HEAD WITHOUT CONTRAST TECHNIQUE: Multiplanar, multiecho pulse sequences of the brain and surrounding structures were obtained without intravenous contrast. COMPARISON:  MRI from 07/18/2023. FINDINGS: Brain: Cerebral volume within normal limits for age. Patchy T2/FLAIR hyperintensity involving the supratentorial cerebral white matter, most like related chronic microvascular ischemic disease, stable. No evidence for acute or subacute infarct. No areas of chronic cortical infarction or other insult. No acute intracranial hemorrhage. Single punctate chronic microhemorrhage noted at the left frontal corona radiata, stable. No mass lesion, midline shift or mass effect. Ventricles stable in size without hydrocephalus. No extra-axial fluid collection. Pituitary gland and suprasellar region within normal limits. No evidence for acute CNS infection by MRI. Vascular: Major intracranial vascular flow voids are stable and maintained. Skull and upper cervical spine: Craniocervical junction within normal limits. Diffusely abnormal appearance of the  visualized osseous structures, consistent with Mets or myeloma, relatively stable from prior. No scalp soft tissue abnormality. Sinuses/Orbits: Globes and orbital soft tissues within normal limits. Scattered mucosal thickening present about the sphenoid and maxillary sinuses. Large right with moderate left mastoid effusions. Other: Incidental cystic lesion at the nasal bridge again noted, likely a dermoid/epidermoid, stable. IMPRESSION: 1. Stable brain MRI. No acute intracranial abnormality. 2. Diffusely abnormal appearance of the visualized osseous structures, concerning for metastatic disease or myeloma, stable. 3. Moderate cerebral white matter disease, nonspecific, but most commonly related to chronic microvascular ischemic disease. 4. Large right with moderate left mastoid effusions, of uncertain significance. Correlation with physical exam recommended. Electronically Signed   By: Virgia Griffins M.D.   On: 07/26/2023 18:25   DG Chest Port 1 View Result Date: 07/26/2023 CLINICAL DATA:  [id number] Increased tracheal  secretions [id number] EXAM: PORTABLE CHEST 1 VIEW COMPARISON:  Jul 24, 2023 FINDINGS: The cardiomediastinal silhouette is unchanged in contour.Tracheostomy. The enteric tube courses through the chest to the abdomen with weighted metallic tip projecting over the stomach. RIGHT IJ CVC catheter tip terminates over the SVC. No pleural effusion. No pneumothorax. Perihilar vascular fullness. There is bronchial wall cuffing. Biapical airspace opacities and LEFT retrocardiac airspace opacity. Advanced degenerative changes of the shoulders. IMPRESSION: 1. Bronchial wall thickening with patchy bilateral airspace opacities likely reflect multifocal infection. Differential considerations include pulmonary edema. Electronically Signed   By: Clancy Crimes M.D.   On: 07/26/2023 10:31   CT CHEST WO CONTRAST Result Date: 07/26/2023 CLINICAL DATA:  Hypoxic respiratory failure. Evaluate for changes suggestive of  invasive fungal infection. EXAM: CT CHEST WITHOUT CONTRAST TECHNIQUE: Multidetector CT imaging of the chest was performed following the standard protocol without IV contrast. RADIATION DOSE REDUCTION: This exam was performed according to the departmental dose-optimization program which includes automated exposure control, adjustment of the mA and/or kV according to patient size and/or use of iterative reconstruction technique. COMPARISON:  07/20/2023 FINDINGS: Cardiovascular: Heart size is normal. There is no pericardial effusion. Aortic atherosclerosis and coronary artery calcifications. Mediastinum/Nodes: Tracheostomy tube tip is above the carina. Enteric tube tip is in the body of the stomach. No mediastinal or hilar adenopathy. Lungs/Pleura: Decreased volume of bilateral pleural effusions. Partial atelectasis of bilateral lower lobes identified. Progressive ground-glass and airspace densities within the left upper lobe noted. There also multiple nodular densities with surrounding ground-glass attenuation throughout both upper lung zones, also increased from the previous exam. Upper Abdomen: No acute abnormality. Nonobstructing stone within the upper pole of the right kidney, image 154/2. Musculoskeletal: Multiple lucent bone lesions are identified throughout the visualized bony thorax. These appears similar in distribution to the previous exam. Unchanged mild compression deformity involving the superior endplate of the T11 vertebra. IMPRESSION: 1. Decreased volume of bilateral pleural effusions. 2. Progressive ground-glass and airspace densities within the left upper lobe. There also multiple nodular densities with surrounding ground-glass attenuation throughout both upper lung zones, also increased from the previous exam. Findings are favored to represent multifocal pneumonia. Nodular densities with surrounding ground-glass attenuation may be seen in the setting of fungal infection particularly and anion 0  compromised in visual. 3. Multiple lucent bone lesions are identified throughout the visualized bony thorax. These appears similar in distribution to the previous exam. Unchanged mild compression deformity involving the superior endplate of the T11 vertebra. Imaging findings are concerning for metastatic disease versus multiple myeloma. 4. Nonobstructing right renal stone. 5. Coronary artery calcifications. 6.  Aortic Atherosclerosis (ICD10-I70.0). Electronically Signed   By: Kimberley Penman M.D.   On: 07/26/2023 04:56   VAS US  UPPER EXTREMITY VENOUS DUPLEX Result Date: 07/25/2023 UPPER VENOUS STUDY  Patient Name:  Riko Muffler.  Date of Exam:   07/24/2023 Medical Rec #: [id number]           Accession #:    [id number] Date of Birth: 02/02/1966           Patient Gender: M Patient Age:   47 years Exam Location:  [facility] Procedure:      VAS US  UPPER EXTREMITY VENOUS DUPLEX Referring Phys: Donavon Fudge HOFFMAN --------------------------------------------------------------------------------  Indications: Edema Risk Factors: Multiple myeloma / chemotherapy. Comparison Study: No previous exams Performing Technologist: Jody Hill RVT, RDMS  Examination Guidelines: A complete evaluation includes B-mode imaging, spectral Doppler, color Doppler, and power Doppler as needed of all accessible  portions of each vessel. Bilateral testing is considered an integral part of a complete examination. Limited examinations for reoccurring indications may be performed as noted.  Right Findings: Subclvian vein not visualized due to patient recieving HD bedside  Left Findings: +----------+------------+---------+-----------+----------+---------------------+ LEFT      CompressiblePhasicitySpontaneousProperties       Summary        +----------+------------+---------+-----------+----------+---------------------+ IJV           Full       Yes       Yes                                     +----------+------------+---------+-----------+----------+---------------------+ Subclavian    Full       Yes       Yes                                    +----------+------------+---------+-----------+----------+---------------------+ Axillary      Full       Yes       Yes                                    +----------+------------+---------+-----------+----------+---------------------+ Brachial      Full       Yes       Yes                                    +----------+------------+---------+-----------+----------+---------------------+ Radial        Full                                                        +----------+------------+---------+-----------+----------+---------------------+ Ulnar         Full                                   only one of paired                                                            visualized       +----------+------------+---------+-----------+----------+---------------------+ Cephalic      Full                                                        +----------+------------+---------+-----------+----------+---------------------+ Basilic       Full       Yes       Yes                                    +----------+------------+---------+-----------+----------+---------------------+ Large non vascularized  heterogenous area in mid and distal upper arm with cystic and solid appearing areas.  Summary:  Right: Unable to visualize subclavian vein on this exam.  Left: No evidence of deep vein thrombosis in the upper extremity. No evidence of superficial vein thrombosis in the upper extremity. Large non vascularized heterogenous area in mid and distal upper arm with cystic and solid appearing areas.  *See table(s) above for measurements and observations.  Diagnosing physician: Runell Countryman Electronically signed by Runell Countryman on 07/25/2023 at 9:32:22 AM.    Final    DG Chest Port 1 View Result Date: 07/24/2023 CLINICAL DATA:   Status post tracheostomy. EXAM: PORTABLE CHEST 1 VIEW COMPARISON:  Chest radiograph dated 07/22/2023. FINDINGS: Tracheostomy with tip approximately 5.5 cm above the carina. Enteric tube extends below the diaphragm with tip beyond the inferior margin of the image. Right IJ central venous line in similar position. Similar appearance of bilateral pulmonary opacities. No pleural effusion or pneumothorax. The cardiac silhouette. No acute osseous pathology. IMPRESSION: 1. Tracheostomy with tip approximately 5.5 cm above the carina. 2. Similar appearance of bilateral pulmonary opacities. Electronically Signed   By: Angus Bark M.D.   On: 07/24/2023 14:17   DG CHEST PORT 1 VIEW Result Date: 07/22/2023 CLINICAL DATA:  Aspiration pneumonia. EXAM: PORTABLE CHEST 1 VIEW COMPARISON:  Jul 18, 2023. FINDINGS: Stable cardiomediastinal silhouette. Tracheostomy and feeding tubes are unchanged. Right internal jugular catheter is unchanged. Increased bilateral lung opacities are noted diffusely consistent with edema or atypical inflammation. Severe degenerative changes are seen involving both glenohumeral joints. IMPRESSION: Stable support apparatus. Increased bilateral lung opacities concerning for edema or pneumonia. Electronically Signed   By: Rosalene Colon M.D.   On: 07/22/2023 14:39   DG Abd Portable 1V Result Date: 07/22/2023 CLINICAL DATA:  Ileus. EXAM: PORTABLE ABDOMEN - 1 VIEW COMPARISON:  July 07, 2023. FINDINGS: The bowel gas pattern is normal. Distal tip of feeding tube is seen in expected position of proximal stomach. No radio-opaque calculi or other significant radiographic abnormality are seen. IMPRESSION: No abnormal bowel dilatation. Electronically Signed   By: Rosalene Colon M.D.   On: 07/22/2023 14:38   CT CHEST WO CONTRAST Result Date: 07/20/2023 CLINICAL DATA:  Sepsis EXAM: CT CHEST WITHOUT CONTRAST TECHNIQUE: Multidetector CT imaging of the chest was performed following the standard protocol without  IV contrast. RADIATION DOSE REDUCTION: This exam was performed according to the departmental dose-optimization program which includes automated exposure control, adjustment of the mA and/or kV according to patient size and/or use of iterative reconstruction technique. COMPARISON:  None Available. FINDINGS: Cardiovascular: No significant vascular findings. The heart is mildly enlarged. There is no pericardial effusion. Aorta is normal in size. Central venous catheter tip ends in the SVC. Mediastinum/Nodes: Endotracheal tube tip is 2.4 cm above the carina. Enteric tube is seen throughout nondilated esophagus. No enlarged lymph nodes are identified allowing for lack of intravenous contrast. The visualized thyroid gland is within normal limits. Lungs/Pleura: There are small bilateral pleural effusions. There is bilateral lower lobe airspace consolidation. Multifocal patchy airspace areas and ill-defined ground-glass slightly nodular opacities are seen throughout the bilateral upper lobes and minimally in the right middle lobe, also likely infectious/inflammatory. There is no pneumothorax. Upper Abdomen: Gallstones are present. Enteric tube is partially visualized in the stomach. Musculoskeletal: There are severe degenerative changes in both shoulders. The bones are diffusely osteopenic. There are scattered lucent lesions throughout the thoracic spine measuring up to 8 mm. There is minimal compression deformity of  the superior endplate of T11. IMPRESSION: 1. Bilateral lower lobe airspace consolidation compatible with pneumonia. 2. Multifocal patchy airspace areas and ill-defined ground-glass slightly nodular opacities throughout the bilateral upper lobes and minimally in the right middle lobe, also likely infectious/inflammatory. 3. Small bilateral pleural effusions. 4. Mild cardiomegaly. 5. Cholelithiasis. 6. Scattered lucent lesions throughout the thoracic spine measuring up to 8 mm, nonspecific. Findings may be  related to multiple myeloma or metastatic disease. 7. Minimal compression deformity of the superior endplate of T11. Electronically Signed   By: Tyron Gallon M.D.   On: 07/20/2023 16:31   VAS US  LOWER EXTREMITY VENOUS (DVT) Result Date: 07/18/2023  Lower Venous DVT Study Patient Name:  Amoni Tureaud.  Date of Exam:   07/18/2023 Medical Rec #: [id number]           Accession #:    [id number] Date of Birth: Mar 20, 1965           Patient Gender: M Patient Age:   14 years Exam Location:  [facility] Procedure:      VAS US  LOWER EXTREMITY VENOUS (DVT) Referring Phys: ADITYA PALIWAL --------------------------------------------------------------------------------  Indications: Swelling, and Edema.  Risk Factors: Immobility Cancer myeloma. Limitations: Patient positioning. Comparison Study: None. Performing Technologist: Estanislao Heimlich  Examination Guidelines: A complete evaluation includes B-mode imaging, spectral Doppler, color Doppler, and power Doppler as needed of all accessible portions of each vessel. Bilateral testing is considered an integral part of a complete examination. Limited examinations for reoccurring indications may be performed as noted. The reflux portion of the exam is performed with the patient in reverse Trendelenburg.  +-----+---------------+---------+-----------+----------+--------------+ RIGHTCompressibilityPhasicitySpontaneityPropertiesThrombus Aging +-----+---------------+---------+-----------+----------+--------------+ CFV  Full           Yes      Yes                                 +-----+---------------+---------+-----------+----------+--------------+ Large hematoma noted at top of right leg  +---------+---------------+---------+-----------+----------+-------------------+ LEFT     CompressibilityPhasicitySpontaneityPropertiesThrombus Aging      +---------+---------------+---------+-----------+----------+-------------------+ CFV      Full           Yes       Yes                                      +---------+---------------+---------+-----------+----------+-------------------+ SFJ      Full                                                             +---------+---------------+---------+-----------+----------+-------------------+ FV Prox  Full                                                             +---------+---------------+---------+-----------+----------+-------------------+ FV Mid   Full                                                             +---------+---------------+---------+-----------+----------+-------------------+  FV DistalFull                                                             +---------+---------------+---------+-----------+----------+-------------------+ PFV      Full                                                             +---------+---------------+---------+-----------+----------+-------------------+ POP      Full           Yes      Yes                                      +---------+---------------+---------+-----------+----------+-------------------+ PTV                              Yes                  Not well visualized +---------+---------------+---------+-----------+----------+-------------------+ PERO                             Yes                  Not well visualized +---------+---------------+---------+-----------+----------+-------------------+     Summary: RIGHT: - No evidence of common femoral vein obstruction.   LEFT: - There is no evidence of deep vein thrombosis in the lower extremity.  - No cystic structure found in the popliteal fossa.  *See table(s) above for measurements and observations. Electronically signed by Delaney Fearing on 07/18/2023 at 4:52:00 PM.    Final    DG Chest 1 View Result Date: 07/18/2023 CLINICAL DATA:  Endotracheal tube. EXAM: CHEST  1 VIEW COMPARISON:  Jul 17, 2023. FINDINGS: Endotracheal and feeding tube are in grossly good  position. Right internal jugular catheter is unchanged. Mild bibasilar subsegmental atelectasis is noted with small pleural effusions. Degenerative changes are seen involving both shoulder joints. IMPRESSION: Support apparatus as noted above. Bibasilar atelectasis and probable small pleural effusions are noted. Electronically Signed   By: Rosalene Colon M.D.   On: 07/18/2023 15:58   DG FL GUIDED LUMBAR PUNCTURE Result Date: 07/18/2023 CLINICAL DATA:  Metabolic encephalopathy EXAM: DIAGNOSTIC LUMBAR PUNCTURE UNDER FLUOROSCOPIC GUIDANCE COMPARISON:  I assessed the brain imaging of 07/17/2023 and the CT abdomen of 07/08/2023 in preparation for this exam. FLUOROSCOPY: Radiation Exposure Index (as provided by the fluoroscopic device): 3.4 mGy Kerma PROCEDURE: The risks (including hemorrhage, infection, and nerve damage, among others), benefits, and alternatives to fluoroscopically guided lumbar puncture were discussed with the patient's wife by telephone, as the patient is unable to consent and has altered mental status. The patient's wife understood and elected for the patient to undergo the procedure. Standard time-out was employed. Following sterile skin prep and local anesthetic administration consisting of 1 percent lidocaine , a 22 gauge spinal needle was advanced without difficulty into the thecal sac at the L2-3 level under fluoroscopic guidance. Clear CSF was returned. Because the patient is  intubated, I did not turn him onto his side to obtain a pressure measurement. A total of 11.5 cc of clear CSF was collected in 4 vials. The needle was subsequently removed and the skin cleansed and bandaged. No immediate complications were observed. IMPRESSION: Technically successful fluoroscopically guided lumbar puncture at the L2-3 level. Electronically Signed   By: Freida Jes M.D.   On: 07/18/2023 13:39   MR BRAIN WO CONTRAST Result Date: 07/18/2023 CLINICAL DATA:  Delirium EXAM: MRI HEAD WITHOUT CONTRAST  TECHNIQUE: Multiplanar, multiecho pulse sequences of the brain and surrounding structures were obtained without intravenous contrast. COMPARISON:  CT head 07/17/2023. FINDINGS: Brain: No acute infarct. Punctate susceptibility in the left corona radiata suggestive of chronic microhemorrhage. No additional foci of hemorrhage appreciated. Scattered and confluent T2/FLAIR hyperintensity in the periventricular and subcortical white matter. No mass lesion or midline shift. Cerebellum is unremarkable. Normal appearance of midline structures. The basilar cisterns are patent. No extra-axial fluid collections. Ventricles: Normal size and configuration of the ventricles. Vascular: Skull base flow voids are visualized. Skull and upper cervical spine: Facet arthrosis in the visualized upper cervical spine most pronounced on the right at C3-4. Redemonstration of multiple calvarial lesions corresponding to lytic lesions on CT. Sinuses/Orbits: The orbits are symmetric. Minimal mucosal thickening in the alveolar recess of the right maxillary sinus. There is a 1.3 x 1.0 x 1.5 cm circumscribed cystic appearing lesion in the subcutaneous tissues overlying the nasal bones slightly right of midline which demonstrates internal restricted diffusion. No evidence of connection to the intracranial compartment. Other: Large right mastoid effusion. Trace fluid in the left mastoid air cells. Fluid within the nasopharynx. Partially visualized enteric tube and endotracheal tube. IMPRESSION: No acute intracranial abnormality. Nonspecific white matter signal abnormality likely reflecting moderate chronic microvascular ischemic changes. Multiple calvarial lesions corresponding to lytic lesions on CT. Findings concerning for multiple myeloma versus metastasis. 1.5 cm cystic lesion in the subcutaneous tissues overlying the nasal bones likely reflecting epidermoid cyst versus dermoid. No evidence of connection to the intracranial compartment. Large  right mastoid effusion. Electronically Signed   By: Denny Flack M.D.   On: 07/18/2023 12:54   CT HEAD WO CONTRAST ( ) Result Date: 07/17/2023 CLINICAL DATA:  58 year old male altered mental status. Respiratory failure. "Myeloma". EXAM: CT HEAD WITHOUT CONTRAST TECHNIQUE: Contiguous axial images were obtained from the base of the skull through the vertex without intravenous contrast. RADIATION DOSE REDUCTION: This exam was performed according to the departmental dose-optimization program which includes automated exposure control, adjustment of the mA and/or kV according to patient size and/or use of iterative reconstruction technique. COMPARISON:  No prior head CT.  CT Abdomen and Pelvis 07/08/2023. FINDINGS: Brain: Cerebral volume is within normal limits for age. No midline shift, ventriculomegaly, mass effect, evidence of mass lesion, intracranial hemorrhage or evidence of cortically based acute infarction. Gray-white differentiation maintained, mild to moderate for age scattered cerebral white matter hypodensity. Vascular: Calcified atherosclerosis at the skull base. No suspicious intracranial vascular hyperdensity. Skull: Scattered nonspecific round an oval lucent areas in the bilateral calvarium. No pathologic fracture identified. Sinuses/Orbits: Intubated. Right nasoenteric tube in place. Relatively well aerated paranasal sinuses, partial opacification of the right middle ear and mastoids. Other: Visualized orbits and scalp soft tissues are within normal limits. IMPRESSION: 1. No acute intracranial abnormality. Mild to moderate for age cerebral white matter changes most commonly due to small vessel disease. 2. Scattered lytic lesions in the skull, similar to the skeletal findings on 07/08/2023, and highly suspicious for  Multiple Myeloma versus lytic metastases. 3. Intubated. Electronically Signed   By: Marlise Simpers M.D.   On: 07/17/2023 14:37   DG CHEST PORT 1 VIEW Result Date: 07/17/2023 CLINICAL DATA:   Acute respiratory failure.  Hypoxia. EXAM: PORTABLE CHEST 1 VIEW COMPARISON:  07/13/2023 FINDINGS: A feeding tube passes into the stomach although the distal tip position is not included on the film. Right IJ central line tip overlies the proximal SVC level. Low volume film with interval development of vascular congestion basilar predominant interstitial opacity, concerning for edema. Probable small left effusion. Cardiopericardial silhouette is at upper limits of normal for size. Degenerative changes noted in both shoulders. Telemetry leads overlie the chest. IMPRESSION: Low volume film with interval development of vascular congestion and basilar predominant interstitial opacity, concerning for edema. Probable small left effusion. Electronically Signed   By: Donnal Fusi M.D.   On: 07/17/2023 07:41   DG Chest Port 1 View Result Date: 07/13/2023 CLINICAL DATA:  Respiratory failure EXAM: PORTABLE CHEST 1 VIEW COMPARISON:  07/09/2023 FINDINGS: Support apparatus: Endotracheal tube terminates 5.9 cm above carina. Right IJ Cordis sheath unchanged. Feeding tube extends beyond the inferior aspect of the film. Heart/mediastinum: Midline trachea.  Normal heart size. Pleura: Possible trace left pleural fluid.  No pneumothorax. Lungs: Persistent left and increased right base airspace disease. Other: Right glenohumeral joint degenerative change. IMPRESSION: Similar left and increased right base airspace disease, favoring atelectasis. Possible trace left pleural fluid. Electronically Signed   By: Lore Rode M.D.   On: 07/13/2023 09:39   EEG adult Result Date: 07/11/2023 Arleene Lack, MD     07/11/2023  3:28 PM Patient Name: Eliijah Lafayette. MRN: [id number] Epilepsy Attending: Arleene Lack Referring Physician/Provider: Casimiro Cleaves, PA-C Date: 07/11/2023 Duration: 24.55 mins Patient history: 58yo M with ams. EEG to evaluate for seizure Level of alertness:  comatose/ lethargic AEDs during EEG study: VPA Technical  aspects: This EEG study was done with scalp electrodes positioned according to the 10-20 International system of electrode placement. Electrical activity was reviewed with band pass filter of 1-70Hz , sensitivity of 7 uV/mm, display speed of 1mm/sec with a 60Hz  notched filter applied as appropriate. EEG data were recorded continuously and digitally stored.  Video monitoring was available and reviewed as appropriate. Description: EEG showed continuous generalized 3-5 hz theta-delta slowing, at times with triphasic morphology.  Hyperventilation and photic stimulation were not performed.   ABNORMALITY - Continuous slow, generalized IMPRESSION: This study is suggestive of moderate to severe encephalopathy, could be secondary to toxic metabolic causes. No seizures or epileptiform discharges were seen throughout the recording. Arleene Lack   DG Chest 1 View Result Date: 07/09/2023 CLINICAL DATA:  Status post central line placement EXAM: PORTABLE CHEST 1 VIEW COMPARISON:  07/08/2023 FINDINGS: Endotracheal tube and feeding catheter are noted in satisfactory position. New right jugular catheter is seen in satisfactory position. No pneumothorax is noted. The overall inspiratory effort is poor with crowding of the vascular markings. IMPRESSION: Poor inspiratory effort. No pneumothorax following central line placement. Electronically Signed   By: Violeta Grey M.D.   On: 07/09/2023 11:19   CT ABDOMEN PELVIS WO CONTRAST Result Date: 07/08/2023 CLINICAL DATA:  Palpable right groin mass. Anemia requiring 3 units of packed red blood cells. Sepsis due to acquired pneumonia. Lytic bone lesions with a clinical concern for a new diagnosis of multiple myeloma. Type 2 diabetes. EXAM: CT ABDOMEN AND PELVIS WITHOUT CONTRAST TECHNIQUE: Multidetector CT imaging of the abdomen and pelvis  was performed following the standard protocol without IV contrast. RADIATION DOSE REDUCTION: This exam was performed according to the departmental  dose-optimization program which includes automated exposure control, adjustment of the mA and/or kV according to patient size and/or use of iterative reconstruction technique. COMPARISON:  CT-guided right iliac bone marrow aspiration and core biopsy dated 07/04/2023. FINDINGS: Lower chest: Multifocal patchy opacities in both lower lobes. Small left pleural effusion and minimal right pleural effusion. Borderline enlarged heart. Hepatobiliary: Normal-appearing liver. Dependent calcification in the gallbladder without gallbladder wall thickening or pericholecystic fluid. Pancreas: Unremarkable. No pancreatic ductal dilatation or surrounding inflammatory changes. Spleen: Normal in size without focal abnormality. Adrenals/Urinary Tract: Foley catheter in the urinary bladder with no significant urine in the bladder. Single tiny calculus in each kidney. Small, exophytic high density left renal cyst compatible with a hemorrhagic or proteinaceous cyst. Probable subcentimeter exophytic left renal cyst, difficult to assess due to streak artifacts produced by the patient's left arm. This could be assessed with renal ultrasound. Unremarkable adrenal glands and ureters. Stomach/Bowel: Stomach is within normal limits. Appendix appears normal. No evidence of bowel wall thickening, distention, or inflammatory changes. Vascular/Lymphatic: Atheromatous arterial calcifications without aneurysm. No enlarged lymph nodes. Reproductive: Normal-sized prostate gland containing coarse calcifications. Other: Large right buttock and groin hematoma containing a fluid/hematocrit level. This measures 36.0 x 11.4 x 9.7 cm in maximum dimensions on sagittal and coronal images 32/7 and 39/6 respectively. Musculoskeletal: Left hip prosthesis with associated streak artifacts. Nondisplaced right 10th posterolateral rib fracture. Old, healed left posterolateral 10th rib fracture. Multiple small to medium-sized lytic lesions in multiple lumbar and lower  thoracic vertebral bodies and in the pelvic bones bilaterally. Moderate right hip degenerative changes. IMPRESSION: 1. Large acute right buttock and groin hematoma measuring 36.0 x 11.4 x 9.7 cm. 2. Nondisplaced right 10th posterolateral rib fracture. 3. Multiple small to medium-sized lytic lesions in multiple lumbar and lower thoracic vertebral bodies and in the pelvic bones bilaterally, compatible with the clinical suspicion of multiple myeloma. Lytic metastases can also have this appearance. 4. Multifocal patchy opacities in both lower lobes, compatible with known pneumonia. 5. Small left pleural effusion and minimal right pleural effusion. 6. Cholelithiasis. 7. Tiny bilateral renal calculi. 8. Probable subcentimeter exophytic left renal cyst, difficult to assess due to streak artifacts produced by the patient's left arm. This could be assessed with renal ultrasound. Electronically Signed   By: Catherin Closs M.D.   On: 07/08/2023 16:07   DG CHEST PORT 1 VIEW Result Date: 07/08/2023 CLINICAL DATA:  [id number] Endotracheally intubated [id number] EXAM: PORTABLE CHEST 1 VIEW COMPARISON:  07/08/2023 FINDINGS: The endotracheal tube is positioned just to the left of midline, likely within the mid trachea, likely due to patient rotation. Mild cardiomegaly. No focal airspace consolidation, pleural effusion, or pneumothorax. No cardiomegaly. No acute fracture or destructive lesion. Moderate osteoarthritis of the right shoulder. Multilevel degenerative disc disease of the spine. Weighted feeding tube courses below the diaphragm with the distal tip not included in the field of view. IMPRESSION: 1. The endotracheal tube is positioned just to the left of midline, likely within the mid trachea, but abnormally positioned due to patient rotation. No pneumothorax. 2. Weighted feeding tube courses below the diaphragm, distal tip outside the field of view. Electronically Signed   By: Rance Burrows M.D.   On: 07/08/2023 14:02   DG  CHEST PORT 1 VIEW Result Date: 07/08/2023 CLINICAL DATA:  Dyspnea. EXAM: PORTABLE CHEST 1 VIEW COMPARISON:  07/06/2023 FINDINGS: Feeding tube extends down  the esophagus into the stomach. Tube appears to be coiled in the stomach but the tip is not clearly identified. Heart size is within normal limits and stable. Prominent vascular and interstitial lung markings are unchanged. No focal airspace disease or lung consolidation. Again noted is severe joint space narrowing and probable degenerative changes in both shoulders. Trachea is midline. IMPRESSION: 1. Again noted are prominent vascular and interstitial lung markings. Findings could represent vascular congestion or mild edema. Minimal change since 07/06/2023. 2. Feeding tube extends into the abdomen. Electronically Signed   By: Elene Griffes M.D.   On: 07/08/2023 10:25   DG Abd 1 View Result Date: 07/07/2023 CLINICAL DATA:  Nasogastric tube placement. EXAM: ABDOMEN - 1 VIEW COMPARISON:  None Available. FINDINGS: Weighted enteric tube tip in the left upper abdomen in the region of the mid or distal stomach. No bowel dilatation in the included upper abdomen. IMPRESSION: Weighted enteric tube tip in the mid or distal stomach. Electronically Signed   By: Chadwick Colonel M.D.   On: 07/07/2023 17:08   DG Fluoro Rm 1-60 Min - No Report Result Date: 07/07/2023 CLINICAL DATA:  Acute encephalopathy, nasogastric tube placement requested. EXAM: FLUORO RM 1-60 MIN-NO REPORT FLUOROSCOPY: Fluoroscopy Time:  0 minutes 48 seconds Radiation Exposure Index (if provided by the fluoroscopic device): 7.0 mGy Number of Acquired Spot Images: 1 COMPARISON:  None Available. FINDINGS: Multiple attempts at nasogastric tube placement with a 20 French catheter resulted in right or left mainstem bronchus positioning. Patient was unable to swallow or follow directions to aid with insertion. Study was terminated. IMPRESSION: Unsuccessful nasogastric tube placement under fluoroscopy.  Electronically Signed   By: Shearon Denis M.D.   On: 07/07/2023 14:13   ECHOCARDIOGRAM COMPLETE Result Date: 07/07/2023    ECHOCARDIOGRAM REPORT   Patient Name:   Tripper Schneekloth. Date of Exam: 07/07/2023 Medical Rec #:  [id number]          Height:       74.0 in Accession #:    [id number]         Weight:       236.6 lb Date of Birth:  1965-03-24          BSA:          2.334 m Patient Age:    58 years           BP:           141/88 mmHg Patient Gender: M                  HR:           110 bpm. Exam Location:  Inpatient Procedure: 3D Echo, Cardiac Doppler, Color Doppler and Intracardiac            Opacification Agent (Both Spectral and Color Flow Doppler were            utilized during procedure). Indications:    Atrial fibrillation  History:        Patient has no prior history of Echocardiogram examinations.  Sonographer:    Juanita Shaw Referring Phys: [id number] HYQMVH PARKER  Sonographer Comments: Image acquisition challenging due to patient body habitus and Image acquisition challenging due to respiratory motion. IMPRESSIONS  1. Left ventricular ejection fraction, by estimation, is 65 to 70%. The left ventricle has normal function. The left ventricle has no regional wall motion abnormalities. Left ventricular diastolic parameters are indeterminate.  2. Right ventricular systolic function is normal. The right  ventricular size is normal. There is normal pulmonary artery systolic pressure. The estimated right ventricular systolic pressure is 35.8 mmHg.  3. Left atrial size was moderately dilated.  4. The mitral valve is normal in structure. No evidence of mitral valve regurgitation. No evidence of mitral stenosis.  5. The aortic valve is tricuspid. Aortic valve regurgitation is not visualized. Aortic valve sclerosis/calcification is present, without any evidence of aortic stenosis. Aortic valve mean gradient measures 8.0 mmHg.  6. Aortic dilatation noted. There is mild dilatation of the aortic root, measuring 39  mm.  7. The inferior vena cava is dilated in size with <50% respiratory variability, suggesting right atrial pressure of 15 mmHg.  8. The patient was in atrial fibrillation. FINDINGS  Left Ventricle: Left ventricular ejection fraction, by estimation, is 65 to 70%. The left ventricle has normal function. The left ventricle has no regional wall motion abnormalities. The left ventricular internal cavity size was normal in size. There is  no left ventricular hypertrophy. Left ventricular diastolic parameters are indeterminate. Right Ventricle: The right ventricular size is normal. No increase in right ventricular wall thickness. Right ventricular systolic function is normal. There is normal pulmonary artery systolic pressure. The tricuspid regurgitant velocity is 2.28 m/s, and  with an assumed right atrial pressure of 15 mmHg, the estimated right ventricular systolic pressure is 35.8 mmHg. Left Atrium: Left atrial size was moderately dilated. Right Atrium: Right atrial size was normal in size. Pericardium: There is no evidence of pericardial effusion. Mitral Valve: The mitral valve is normal in structure. No evidence of mitral valve regurgitation. No evidence of mitral valve stenosis. MV peak gradient, 5.4 mmHg. The mean mitral valve gradient is 2.0 mmHg. Tricuspid Valve: The tricuspid valve is normal in structure. Tricuspid valve regurgitation is trivial. Aortic Valve: The aortic valve is tricuspid. Aortic valve regurgitation is not visualized. Aortic valve sclerosis/calcification is present, without any evidence of aortic stenosis. Aortic valve mean gradient measures 8.0 mmHg. Aortic valve peak gradient measures 16.0 mmHg. Aortic valve area, by VTI measures 2.25 cm. Pulmonic Valve: The pulmonic valve was normal in structure. Pulmonic valve regurgitation is not visualized. Aorta: Aortic dilatation noted. There is mild dilatation of the aortic root, measuring 39 mm. Venous: The inferior vena cava is dilated in size  with less than 50% respiratory variability, suggesting right atrial pressure of 15 mmHg. IAS/Shunts: No atrial level shunt detected by color flow Doppler.  LEFT VENTRICLE PLAX 2D LVIDd:         5.00 cm      Diastology LVIDs:         3.60 cm      LV e' medial:    7.94 cm/s LV PW:         0.60 cm      LV E/e' medial:  17.0 LV IVS:        0.90 cm      LV e' lateral:   12.90 cm/s LVOT diam:     2.00 cm      LV E/e' lateral: 10.5 LV SV:         56 LV SV Index:   24 LVOT Area:     3.14 cm  LV Volumes (MOD) LV vol d, MOD A4C: 170.0 ml LV vol s, MOD A4C: 50.6 ml LV SV MOD A4C:     170.0 ml RIGHT VENTRICLE             IVC RV Basal diam:  4.20 cm  IVC diam: 2.50 cm RV Mid diam:    2.80 cm RV S prime:     22.40 cm/s TAPSE (M-mode): 2.0 cm LEFT ATRIUM              Index        RIGHT ATRIUM           Index LA diam:        4.20 cm  1.80 cm/m   RA Area:     17.80 cm LA Vol (A2C):   106.0 ml 45.41 ml/m  RA Volume:   43.80 ml  18.76 ml/m LA Vol (A4C):   86.7 ml  37.14 ml/m LA Biplane Vol: 101.0 ml 43.27 ml/m  AORTIC VALVE                     PULMONIC VALVE AV Area (Vmax):    2.04 cm      PV Vmax:       1.42 m/s AV Area (Vmean):   2.04 cm      PV Peak grad:  8.1 mmHg AV Area (VTI):     2.25 cm AV Vmax:           200.00 cm/s AV Vmean:          125.000 cm/s AV VTI:            0.248 m AV Peak Grad:      16.0 mmHg AV Mean Grad:      8.0 mmHg LVOT Vmax:         130.00 cm/s LVOT Vmean:        81.200 cm/s LVOT VTI:          0.178 m LVOT/AV VTI ratio: 0.72  AORTA Ao Root diam: 3.90 cm Ao Asc diam:  3.30 cm MITRAL VALVE                TRICUSPID VALVE MV Area (PHT): 5.70 cm     TR Peak grad:   20.8 mmHg MV Area VTI:   2.31 cm     TR Vmax:        228.00 cm/s MV Peak grad:  5.4 mmHg MV Mean grad:  2.0 mmHg     SHUNTS MV Vmax:       1.16 m/s     Systemic VTI:  0.18 m MV Vmean:      65.4 cm/s    Systemic Diam: 2.00 cm MV Decel Time: 133 msec MV E velocity: 135.00 cm/s Dalton McleanMD Electronically signed by Archer Bear  Signature Date/Time: 07/07/2023/9:53:50 AM    Final    DG CHEST PORT 1 VIEW Result Date: 07/06/2023 CLINICAL DATA:  Weakness altered EXAM: PORTABLE CHEST 1 VIEW COMPARISON:  07/05/2023 FINDINGS: Hypoventilatory changes. Cardiomegaly with mild central congestion. No consolidation, pleural effusion, or pneumothorax IMPRESSION: Cardiomegaly with mild central congestion. Electronically Signed   By: Esmeralda Hedge M.D.   On: 07/06/2023 18:17   DG CHEST PORT 1 VIEW Result Date: 07/05/2023 CLINICAL DATA:  Dyspnea EXAM: PORTABLE CHEST 1 VIEW COMPARISON:  07/04/2023 FINDINGS: Hypoventilatory change. Cardiomegaly with mild central congestion. No pleural effusion or pneumothorax. Advanced degenerative change of the shoulders. IMPRESSION: Cardiomegaly with mild central congestion. Electronically Signed   By: Esmeralda Hedge M.D.   On: 07/05/2023 16:06   DG CHEST PORT 1 VIEW Result Date: 07/04/2023 CLINICAL DATA:  Hypertension and dyspnea, generalized weakness, AKI EXAM: PORTABLE CHEST 1 VIEW COMPARISON:  None are available. There is a report from radiographs  12/14/2018 FINDINGS: Borderline cardiomegaly. Diffuse interstitial coarsening and airspace opacities bilaterally. No pleural effusion or pneumothorax. No displaced rib fractures. Advanced arthritis both shoulders. IMPRESSION: Diffuse interstitial coarsening and airspace opacities bilaterally, likely edema. Atypical infection could appear similarly. Electronically Signed   By: Rozell Cornet M.D.   On: 07/04/2023 22:21   CT BONE MARROW BIOPSY & ASPIRATION Result Date: 07/04/2023 INDICATION: [id number] Myeloma (HCC) [id number] EXAM: CT GUIDED BONE MARROW ASPIRATION AND CORE BIOPSY MEDICATIONS: None. ANESTHESIA/SEDATION: Moderate (conscious) sedation was employed during this procedure. A total of Versed  2 mg and Fentanyl  50 mcg was administered intravenously. Moderate Sedation Time: 16 minutes. The patient's level of consciousness and vital signs were monitored continuously  by radiology nursing throughout the procedure under my direct supervision. FLUOROSCOPY TIME:  CT dose; 314 mGycm COMPLICATIONS: None immediate. Estimated blood loss: <5 mL PROCEDURE: RADIATION DOSE REDUCTION: This exam was performed according to the departmental dose-optimization program which includes automated exposure control, adjustment of the mA and/or kV according to patient size and/or use of iterative reconstruction technique. Informed written consent was obtained from the patient and/or patient's representative after a thorough discussion of the procedural risks, benefits and alternatives. All questions were addressed. Maximal Sterile Barrier Technique was utilized including caps, mask, sterile gowns, sterile gloves, sterile drape, hand hygiene and skin antiseptic. A timeout was performed prior to the initiation of the procedure. The patient was positioned prone and non-contrast localization CT was performed of the pelvis to demonstrate the iliac marrow spaces. Under sterile conditions and local anesthesia, an 11 gauge coaxial bone biopsy needle was advanced into the RIGHT iliac marrow space. Needle position was confirmed with CT imaging. Initially, bone marrow aspiration was performed. Next, the 11 gauge outer cannula was utilized to obtain a 1 iliac bone marrow core biopsy. Needle was removed. Hemostasis was obtained with compression. The patient tolerated the procedure well. Samples were prepared with the cytotechnologist. IMPRESSION: Successful CT-guided bone marrow aspiration and biopsy. Art Largo, MD [facility] Electronically Signed   By: Art Largo M.D.   On: 07/04/2023 12:21

## 2023-07-31 NOTE — Progress Notes (Signed)
 eLink Physician-Brief Progress Note Patient Name: Frank [name]. DOB: 12/31/1965 MRN: [id number]   Date of Service  07/31/2023  HPI/Events of Note  Patient is a RASS of -4.  eICU Interventions  Hold Oxycodone  / Gabapentin  / Seroquel  / Klonopin . Do not resume ANY of these medications until his sedation score improves to a minimum RASS of -1 to -2.        [name] [name] 07/31/2023, 11:12 PM

## 2023-07-31 NOTE — [place] FL2 (Addendum)
 [place]  MEDICAID [id number] LEVEL OF CARE FORM     IDENTIFICATION  Patient Name: Frank [name]. Birthdate: 04/06/1965 Sex: male Admission Date (Current Location): 07/03/2023  [place] and [place] Number:   ([place])   Facility and Address:  [facility] [place], [address], [place], [place] [address]      Provider Number: [id number]  Attending Physician Name and Address:  Margaretann Sharper, MD  Relative Name and Phone Number:  Ashten Rapkin; Spouse; [phone]    Current Level of Care: Hospital Recommended Level of Care: Skilled Nursing Facility, Vent SNF (can wean to trach collar) Prior Approval Number:    Date Approved/Denied:   PASRR Number:    Discharge Plan: SNF    Current Diagnoses: Patient Active Problem List   Diagnosis Date Noted   Protein-calorie malnutrition, severe 07/29/2023   Multiple myeloma without remission (HCC) 07/24/2023   Lobar pneumonia, unspecified organism (HCC) 07/21/2023   On mechanically assisted ventilation (HCC) 07/19/2023   Acute metabolic encephalopathy 07/19/2023   Acute encephalopathy 07/14/2023   Severe sepsis with septic shock (HCC) 07/14/2023   PAF (paroxysmal atrial fibrillation) (HCC) 07/09/2023   Acute respiratory failure with hypoxia (HCC) 07/08/2023   Atrial fibrillation with rapid ventricular response (HCC) 07/07/2023   AKI (acute kidney injury) (HCC) 07/07/2023   Multiple myeloma not having achieved remission (HCC) 07/04/2023   Generalized weakness 07/03/2023   Closed fracture of left distal humerus 01/29/2023   Status post total left knee replacement 11/23/2021   Unilateral primary osteoarthritis, right knee 01/25/2019   Carpal tunnel syndrome, left upper limb 10/15/2018   Carpal tunnel syndrome, right upper limb 10/15/2018   Bilateral hand numbness 09/09/2018   Plantar fasciitis of left foot 09/09/2018   Status post total replacement of left hip 12/26/2017   Unilateral  primary osteoarthritis, left knee 11/12/2017   Hypogonadism, male 05/08/2016    Orientation RESPIRATION BLADDER Height & Weight        Tracheostomy, Vent (10L oxygen; FiO2 50%; able to wean to trach collar) Incontinent, External catheter Weight: 225 lb 8.5 oz (102.3 kg) Height:  6' 0.01" (182.9 cm)  BEHAVIORAL SYMPTOMS/MOOD NEUROLOGICAL BOWEL NUTRITION STATUS    Convulsions/Seizures Incontinent (Fecal Managemeny System 45mL) Diet (Please see discharge summary; will discharge with PEG)  AMBULATORY STATUS COMMUNICATION OF NEEDS Skin   Extensive Assist  (Trach) PU Stage and Appropriate Care (Pressure Injury Buttocks Right;Lower; Pressure Injury Buttocks Mid Unstageable 8 cm x 6 cmx 0.1 cm; Pressure Injury Heel Left;Right Unstageable)                       Personal Care Assistance Level of Assistance  Bathing, Feeding, Dressing Bathing Assistance: Maximum assistance Feeding assistance: Maximum assistance Dressing Assistance: Maximum assistance     Functional Limitations Info  Sight, Speech, Hearing Sight Info: Impaired (R and L (conjuctiva red)) Hearing Info: Impaired (R and L) Speech Info: Impaired (Trach)    SPECIAL CARE FACTORS FREQUENCY  OT (By licensed OT), PT (By licensed PT), Speech therapy     PT Frequency: 5x OT Frequency: 5x     Speech Therapy Frequency: 3x if needed      Contractures Contractures Info: Not present    Additional Factors Info  Code Status, Allergies, Suctioning Needs, Insulin  Sliding Scale Code Status Info: Full Code Allergies Info: Morphine and Codeine; Shellfish; Betadine  (povidone Iodine ); Bupropion; Chlorhexidine ; Influenza Vaccines; Metoprolol    Insulin  Sliding Scale Info: insulin  aspart (novoLOG ) injection 0-20  Units, Subcutaneous, Every 4 hours, First dose on Fri 07/11/23 at 0945  Correction coverage: Resistant (obese, steroids)  CBG < 70: Implement Hypoglycemia Standing Orders and refer to Hypoglycemia Standing Orders sidebar report  CBG  70 - 120: 0 units  CBG 121 - 150: 3 units  CBG 151 - 200: 4 units  CBG 201 - 250: 7 units  CBG 251 - 300: 11 units  CBG 301 - 350: 15 units  CBG 351 - 400: 20 units  CBG > 400: call MD and obtain STAT lab verification; insulin  aspart (novoLOG ) injection 6 Units, Subcutaneous, Every 4 hours; insulin  glargine-yfgn  insulin  glargine-yfgn (SEMGLEE ) injection 8 Unit, Subcutaneous, 2x daily   Suctioning Needs: Suction at bedside routine as needed; Airway kit and ETT available in room; Maintain Ambu bag, appropriate suction catheters, suction equipment, and same size trach at bedside at all times   Current Medications (07/31/2023):  This is the current hospital active medication list Current Facility-Administered Medications  Medication Dose Route Frequency Provider Last Rate Last Admin   acetaminophen  (TYLENOL ) 160 MG/5ML solution 650 mg  650 mg Per Tube Q6H PRN Hunsucker, Archer Kobs, MD   650 mg at 07/29/23 2347   Or   acetaminophen  (TYLENOL ) suppository 650 mg  650 mg Rectal Q4H PRN Hunsucker, Archer Kobs, MD       alteplase  (CATHFLO ACTIVASE ) injection 2 mg  2 mg Intracatheter Once PRN Chucky Craver, MD       amiodarone  (PACERONE ) tablet 200 mg  200 mg Per NG tube Daily Bowser, Darin Edinger, NP   200 mg at 07/31/23 1610   anticoagulant sodium citrate solution 5 mL  5 mL Intracatheter PRN Chucky Craver, MD       arformoterol  (BROVANA ) nebulizer solution 15 mcg  15 mcg Nebulization BID Joesph Mussel, DO   15 mcg at 07/31/23 9604   busPIRone  (BUSPAR ) tablet 15 mg  15 mg Per Tube QHS Dione Franks, Rahul P, PA-C   15 mg at 07/30/23 2105   clonazePAM  (KLONOPIN ) tablet 2 mg  2 mg Per Tube TID Star [place], PA-C   2 mg at 07/31/23 0829   docusate (COLACE) 50 MG/5ML liquid 100 mg  100 mg Per Tube BID PRN Olalere, Adewale A, MD       famotidine  (PEPCID ) 40 MG/5ML suspension 10.4 mg  10.4 mg Per Tube Daily Alekh, Kshitiz, MD   10.4 mg at 07/31/23 0827   feeding supplement (NEPRO CARB STEADY) liquid 1,000 mL  1,000 mL  Per Tube Continuous Olalere, Adewale A, MD 55 mL/hr at 07/31/23 0900 Infusion Verify at 07/31/23 0900   feeding supplement (PROSource TF20) liquid 60 mL  60 mL Per Tube BID Olalere, Adewale A, MD   60 mL at 07/31/23 0828   fentaNYL  (SUBLIMAZE ) bolus via infusion 50-100 mcg  50-100 mcg Intravenous Q15 min PRN Dione Franks, Rahul P, PA-C   100 mcg at 07/30/23 1815   fentaNYL  in NS (55mcg/ml) infusion-PREMIX  25-200 mcg/hr Intravenous Continuous Olalere, Adewale A, MD 7.5 mL/hr at 07/31/23 0900 75 mcg/hr at 07/31/23 0900   folic acid  (FOLVITE ) tablet 1 mg  1 mg Per Tube Daily Scheryl Cushing, RPH   1 mg at 07/31/23 5409   gabapentin  (NEURONTIN ) 250 MG/5ML solution 200 mg  200 mg Per Tube TID Olalere, Adewale A, MD       heparin  injection 1,000 Units  1,000 Units Intracatheter PRN Chucky Craver, MD       heparin  injection 1,000-6,000 Units  1,000-6,000 Units CRRT PRN Chucky Craver, MD   2,400 Units at 07/26/23 1447   heparin  injection 2,500 Units  2,500 Units Dialysis Once in dialysis Chucky Craver, MD       heparin  injection 2,500 Units  2,500 Units Dialysis PRN Margaretann Sharper, MD   2,500 Units at 07/30/23 0845   heparin  injection 5,000 Units  5,000 Units Subcutaneous Q8H Bowser, Darin Edinger, NP   5,000 Units at 07/31/23 0515   heparin  sodium (porcine) injection 2,400 Units  2,400 Units Intracatheter Once Chucky Craver, MD       insulin  aspart (novoLOG ) injection 0-20 Units  0-20 Units Subcutaneous Q4H Payne, John D, PA-C   3 Units at 07/31/23 1141   insulin  aspart (novoLOG ) injection 6 Units  6 Units Subcutaneous Q4H Olalere, Adewale A, MD   6 Units at 07/31/23 1141   insulin  glargine-yfgn (SEMGLEE ) injection 8 Units  8 Units Subcutaneous BID Olalere, Adewale A, MD   8 Units at 07/31/23 1610   ipratropium-albuterol  (DUONEB) 0.5-2.5 (3) MG/3ML nebulizer solution 3 mL  3 mL Nebulization Q4H PRN Wilfredo Hanly, MD       lactulose  (CHRONULAC ) 10 GM/15ML solution 20 g  20 g Per Tube  BID Olalere, Adewale A, MD       leptospermum manuka honey (MEDIHONEY) paste 1 Application  1 Application Topical Daily Maire Scot, MD   1 Application at 07/31/23 0831   lidocaine  (PF) (XYLOCAINE ) 1 % injection 5 mL  5 mL Intradermal PRN Chucky Craver, MD       lidocaine -EPINEPHrine  (XYLOCAINE  W/EPI) 1 %-1:100000 (with pres) injection 20 mL  20 mL Intradermal Once Bowser, Darin Edinger, NP       lidocaine -prilocaine (EMLA) cream 1 Application  1 Application Topical PRN Chucky Craver, MD       midodrine  (PROAMATINE ) tablet 5 mg  5 mg Per Tube TID WC Olalere, Adewale A, MD   5 mg at 07/31/23 1144   multivitamin (RENA-VIT) tablet 1 tablet  1 tablet Per Tube QHS Olalere, Adewale A, MD   1 tablet at 07/30/23 2105   nicotine  (NICODERM CQ  - dosed in mg/24 hr) patch 7 mg  7 mg Transdermal Daily Jaquita Merl P, DO   7 mg at 07/31/23 9604   nutrition supplement (JUVEN) (JUVEN) powder packet 1 packet  1 packet Per Tube BID BM Olalere, Adewale A, MD   1 packet at 07/31/23 5409   ondansetron  (ZOFRAN ) injection 4 mg  4 mg Intravenous Q6H PRN Bowser, Grace E, NP   4 mg at 07/23/23 0640   Oral care mouth rinse  15 mL Mouth Rinse Q2H Hunsucker, Archer Kobs, MD   15 mL at 07/31/23 1142   Oral care mouth rinse  15 mL Mouth Rinse PRN Hunsucker, Archer Kobs, MD       oxyCODONE  (Oxy IR/ROXICODONE ) immediate release tablet 10 mg  10 mg Per Tube Q6H Olalere, Adewale A, MD       PARoxetine  (PAXIL ) tablet 40 mg  40 mg Per Tube Daily Desai, Rahul P, PA-C   40 mg at 07/31/23 0827   pentafluoroprop-tetrafluoroeth (GEBAUERS) aerosol 1 Application  1 Application Topical PRN Chucky Craver, MD       pneumococcal 20-valent conjugate vaccine (PREVNAR 20 ) injection 0.5 mL  0.5 mL Intramuscular Tomorrow-1000 Hunsucker, Archer Kobs, MD       polyethylene glycol (MIRALAX  / GLYCOLAX ) packet 17 g  17 g Per Tube Daily PRN Margaretann Sharper, MD  polyvinyl alcohol  (LIQUIFILM TEARS) 1.4 % ophthalmic solution 1 drop  1 drop Both Eyes PRN  Rexann Catalan, MD   1 drop at 07/18/23 0555   QUEtiapine  (SEROQUEL ) tablet 50 mg  50 mg Per Tube QHS Dewald, Jonathan B, MD   50 mg at 07/30/23 2105   revefenacin  (YUPELRI ) nebulizer solution 175 mcg  175 mcg Nebulization Daily Delories Fetter, NP   175 mcg at 07/31/23 0754   sodium chloride  flush (NS) 0.9 % injection 10-40 mL  10-40 mL Intracatheter Q12H Hunsucker, Archer Kobs, MD   10 mL at 07/31/23 1610   sodium chloride  flush (NS) 0.9 % injection 10-40 mL  10-40 mL Intracatheter PRN Hunsucker, Archer Kobs, MD       thiamine  (VITAMIN B1) tablet 100 mg  100 mg Per Tube Daily Scheryl Cushing, RPH   100 mg at 07/31/23 0829   [START ON 08/01/2023] vancomycin  (VANCOCIN ) IVPB 1000 mg/200 mL premix  1,000 mg Intravenous Q M,W,F-HD Klus, Jesse L, RPH       voriconazole  (VFEND ) 350 mg in sodium chloride  0.9 % 100 mL IVPB  4 mg/kg (Adjusted) Intravenous Q12H Sierra Dresser, RPH 67.5 mL/hr at 07/31/23 1139 350 mg at 07/31/23 1139     Discharge Medications: Please see discharge summary for a list of discharge medications.  Relevant Imaging Results:  Relevant Lab Results:   Additional Information SS# [id number] Is on iHD MWF schedule for now   [place] E Torry Istre, LCSW

## 2023-07-31 NOTE — Progress Notes (Signed)
 [facility] NEPHROLOGY PROGRESS NOTE   Subjective:  Seen in ICU Some interaction, minimal   Objective Vital signs in last 24 hours: Vitals:   07/26/23 1100 07/26/23 1125 07/26/23 1130 07/26/23 1200  BP: (!) 179/96 (!) 146/85 115/72 (!) 83/50  Pulse: (!) 105 96 93 76  Resp: 16 20 20 20   Temp:    99.8 F (37.7 C)  TempSrc:    Axillary  SpO2: 100% 98% 98% 99%  Weight:      Height:        Physical Exam: General: Critically ill looking male, intubated/ trach'd Heart:RRR, s1s2 nl Lungs: Coarse breath sound bilateral on vent Abdomen:soft, Non-tender, non-distended Extremities:No edema, LUE is broken bone not edema Neurology: responding minimally, on trach collar    Assessment/ Plan: Pt is a 58 y.o. yo male with past medical history significant for HTN, HLD, type II DM, BPH, seizure who was initially presented at [facility] due to AMS, admitted for sepsis due to pneumonia, encephalopathy, sepsis, AKI, hypercalcemia symptomatic anemia and Lytic pulm lesion concern for multiple myeloma.  Problems # AKI on CKD 3a - b/l creat 1.1- 1.5 from 2024, eGFR 53- >60 ml/min. Creat here 5.8 on presentation at OSH. AKI due to myeloma kidney +/- hyperCa +/- ACEi (home med). Started CRRT on 4/23. Pt remains anuric, and tolerating CRRT.  No further clotting after circuit heparin  increased 5/6.  Pressors weaned off. Remained hemodynamically stable and CRRT dc'd 5/10 and pt moved to [facility] for iHD.  Is on MWF schedule for now. HD tomorrow.   # AMS/acute metabolic encephalopathy: per primary, seems to be protracted.  MRI, LP completed and unrevealing.   #Volume - appearing euvolemic now, wt's are stable. IV drips are minimal w/ fentanyl  at 20 cc/ hr and TF's at 50 cc/hr. Will UF 2-2.5 L w/ next HD.   # Hypotension: getting midodrine  5 tid now.   # Multiple myeloma: Oncologist started Velcade  and Cytoxan , per oncology team.   On hold for now given ongoing critical  illness.  #AHRF: s/p trach 5/8, per CCM  # Atrial fib-  on amio  #Anemia - prn transfusions. On epogen  per onc.     Larry Poag  MD  [facility] 07/26/2023, 1:31 PM  Recent Labs  Lab 07/24/23 0421 07/24/23 1630 07/25/23 1508 07/26/23 0423  HGB 8.9*  --   --  8.9*  ALBUMIN  1.6*   < > 1.6* 1.8*  1.6*  CALCIUM  7.4*   < > 7.7* 7.7*  7.7*  PHOS 2.0*  1.5*   < > RESULTS UNAVAILABLE DUE TO INTERFERING SUBSTANCE 1.9*  1.5*  CREATININE 1.59*   < > 1.62* 1.77*  1.74*  K 4.3   < > 4.6 4.4  4.5   < > = values in this interval not displayed.    Inpatient medications:  amiodarone   200 mg Per NG tube Daily   arformoterol   15 mcg Nebulization BID   busPIRone   15 mg Per Tube QHS   clonazePAM   1 mg Per Tube BID   docusate  100 mg Per Tube BID   epoetin  alfa  40,000 Units Subcutaneous QODAY   famotidine   10.4 mg Per Tube Daily   feeding supplement (PROSource TF20)  60 mL Per Tube TID   folic acid   1 mg Per Tube Daily   gabapentin   100 mg Per Tube Q12H   heparin  injection (subcutaneous)  5,000 Units Subcutaneous Q8H   insulin  aspart  0-20 Units Subcutaneous Q4H  insulin  aspart  8 Units Subcutaneous Q4H   insulin  glargine-yfgn  10 Units Subcutaneous BID   lactulose   20 g Per Tube TID   leptospermum manuka honey  1 Application Topical Daily   lidocaine -EPINEPHrine   20 mL Intradermal Once   midodrine   10 mg Per Tube TID WC   nicotine   7 mg Transdermal Daily   mouth rinse  15 mL Mouth Rinse Q2H   PARoxetine   40 mg Per Tube Daily   pneumococcal 20-valent conjugate vaccine  0.5 mL Intramuscular Tomorrow-1000   polyethylene glycol  17 g Per Tube Daily   QUEtiapine   50 mg Per Tube QHS   revefenacin   175 mcg Nebulization Daily   sodium chloride  flush  10-40 mL Intracatheter Q12H   sodium chloride  HYPERTONIC  4 mL Nebulization Daily   thiamine   100 mg Per Tube Daily    ampicillin-sulbactam (UNASYN) IV Stopped (07/26/23 6962)   dexmedetomidine  (PRECEDEX ) IV infusion 1.2 mcg/kg/hr (07/26/23  1300)   feeding supplement (VITAL 1.5 CAL) 60 mL/hr at 07/26/23 1300   fentaNYL  infusion INTRAVENOUS 200 mcg/hr (07/26/23 1300)   levofloxacin  (LEVAQUIN ) IV 100 mL/hr at 07/26/23 1300   midazolam      norepinephrine  (LEVOPHED ) Adult infusion 2 mcg/min (07/26/23 1300)   sodium PHOSPHATE  IVPB (in mmol)     voriconazole  Stopped (07/26/23 1225)   Followed by   [name] ON 07/27/2023] voriconazole      acetaminophen  (TYLENOL ) oral liquid 160 mg/5 mL **OR** acetaminophen , fentaNYL , ipratropium-albuterol , midazolam , ondansetron  (ZOFRAN ) IV, mouth rinse, oxyCODONE , polyethylene glycol, polyvinyl alcohol , sodium chloride  flush

## 2023-07-31 NOTE — Progress Notes (Signed)
 Patient still doing very well on trach collar 10L 50%. Vent in room on standby if needed. Trach care done and patient suctioned. Neb given inline.

## 2023-07-31 NOTE — Plan of Care (Signed)
  Problem: Education: Goal: Knowledge of General Education information will improve Description: Including pain rating scale, medication(s)/side effects and non-pharmacologic comfort measures Outcome: Progressing   Problem: Health Behavior/Discharge Planning: Goal: Ability to manage health-related needs will improve Outcome: Progressing   Problem: Clinical Measurements: Goal: Ability to maintain clinical measurements within normal limits will improve Outcome: Progressing Goal: Will remain free from infection Outcome: Progressing Goal: Diagnostic test results will improve Outcome: Progressing Goal: Respiratory complications will improve Outcome: Progressing Goal: Cardiovascular complication will be avoided Outcome: Progressing   Problem: Activity: Goal: Risk for activity intolerance will decrease Outcome: Progressing   Problem: Coping: Goal: Level of anxiety will decrease Outcome: Progressing   Problem: Elimination: Goal: Will not experience complications related to bowel motility Outcome: Progressing Goal: Will not experience complications related to urinary retention Outcome: Progressing   Problem: Safety: Goal: Ability to remain free from injury will improve Outcome: Progressing   Problem: Skin Integrity: Goal: Risk for impaired skin integrity will decrease Outcome: Progressing   Problem: Education: Goal: Ability to describe self-care measures that may prevent or decrease complications (Diabetes Survival Skills Education) will improve Outcome: Progressing Goal: Individualized Educational Video(s) Outcome: Progressing   Problem: Health Behavior/Discharge Planning: Goal: Ability to identify and utilize available resources and services will improve Outcome: Progressing Goal: Ability to manage health-related needs will improve Outcome: Progressing   Problem: Metabolic: Goal: Ability to maintain appropriate glucose levels will improve Outcome: Progressing    Problem: Skin Integrity: Goal: Risk for impaired skin integrity will decrease Outcome: Progressing   Problem: Tissue Perfusion: Goal: Adequacy of tissue perfusion will improve Outcome: Progressing   Problem: Role Relationship: Goal: Method of communication will improve Outcome: Progressing   Problem: Respiratory: Goal: Patent airway maintenance will improve Outcome: Progressing

## 2023-07-31 NOTE — Progress Notes (Signed)
 NAME:  Frank Hanninen., MRN:  [id number], DOB:  [birth year], LOS: 28 ADMISSION DATE:  07/03/2023, CONSULTATION DATE:  07/06/23 REFERRING MD:  Dr Maury Space, CHIEF COMPLAINT:  Encephalopathy   History of Present Illness:   PCCM asked to see patient for encephalopathy   Transferred from [facility] with 1 week of generalized weakness, altered mentation, decreased appetite Recently being worked up for multiple myeloma.  Transferred to [facility] for oncology evaluation Nephrology consulted for AKI   Background history of obesity, asthma, obstructive sleep apnea on CPAP GERD, hypertension, hyperlipidemia Has been feeling weak for the last few days according to spouse Admitted with concern for sepsis, community-acquired pneumonia, antibiotic encephalopathy Workup revealed lytic bony lesions with concern for multiple myeloma Transfused with 3 units packed red blood cells Evaluation so far-bone marrow 07/04/2023  Pertinent  Medical History   Past Medical History:  Diagnosis Date   Anxiety    Arthritis    Asthma    Bipolar disorder (HCC)    Current every day smoker    Depression    Diabetes mellitus, type II (HCC)    Dyspnea    History of kidney stones    History of pneumonia    Hyperlipidemia    Hypertension    Morbid obesity (HCC)    Sedentary lifestyle    Seizures (HCC)    last seizure 12 yrs ago, no current problem   Sleep apnea    uses CPAP nightly   Significant Hospital Events: Including procedures, antibiotic start and stop dates in addition to other pertinent events   4/19-blood cultures, MRSA PCR negative 4/19 chest x-ray with mild pulmonary congestion 4/20 atrial fibrillation with RVR 4/21 nosebleed after IR NG attempt, NG placed by PCCM in afternoon, stable transferred to [facility] 4/22 intubated; some abla requiring prbcs; ct abd/pelvis showing acute groin hematoma tracking from posterior presumed from site of bone marrow biopsy; heparin  held EEG 4/25 >> moderate to severe  encephalopathy without any seizures or epileptiform discharges 4/23 respiratory culture >> MRSE 4/28 remains intubated on CRRT, down to one pressor  4/29 labile BP, NE back up. Responded a bit to fluids. Received Velcade  and Cytoxan  chemotherapy 5/1 remains encephalopathic, check CTH 5/2 LP ETT exchange  5/5 1 PRBC. Palliative care met w family  5/6 IVIG. plan for trach established. Added midodrine  after failed vaso wean. Abx/fungal changed to levaquin   5/7 weaning pressors incr midodrine  incr lactulose . Vaso off  5/8 Trach placed. Some post op bleeding improved with thrombi-pad 5/10 voriconazole  added back based on CT Chest findings, MRI brain with no acute changes 5/13 more alert and able to follow some commands today  Interim History / Subjective:   Tmax 101.8 Does follow simple one-step commands On fentanyl  at a low dose Did tolerate trach collar yesterday, placed back on vent at night  Objective   Blood pressure 131/80, pulse (!) 111, temperature (!) 101.8 F (38.8 C), temperature source Axillary, resp. rate (!) 25, height 6' 0.01" (1.829 m), weight 102.3 kg, SpO2 95%.    Vent Mode: PRVC FiO2 (%):  [30 %-50 %] 30 % Set Rate:  [20 bmp] 20 bmp Vt Set:  [540 mL] 540 mL PEEP:  [5 cmH20] 5 cmH20 Plateau Pressure:  [7 cmH20-10 cmH20] 7 cmH20   Intake/Output Summary (Last 24 hours) at 07/31/2023 0758 Last data filed at 07/31/2023 0700 Gross per 24 hour  Intake 2233.65 ml  Output 2750 ml  Net -516.35 ml   Filed Weights   07/29/23 0500  07/30/23 0827 07/31/23 0547  Weight: 100 kg 100 kg 102.3 kg   Physical Exam: General: Chronically ill-appearing, moist oral mucosa HEENT: tracheostomy tube in place, site looks fair Neuro: Moving all extremities, follows commands intermittently CV: S1-S2 appreciated PULM: Coarse breath sounds at the bases GI: Bowel sounds appreciated Extremities: Trace bilateral lower extremity  edema  __________________________________________________________  Micro: 4/28 BCx > no growth, final 4/30 respiratory culture-few PMN, rare budding yeast, abundant GPC> few aspergillus fumigatus Quantiferon 5/2 > neg Trach asp 5/4> rare GPC, rare mold  PCP smear trach asp 5/5 > neg  Trach asp 5/9 > abundant staph hemolyticus, few budding yeast  CSF studies 1 RBC, 1 WBC, glucose 106 CSF culture-2 organisms acute, rare WBC> NGTD Cryptococcal antigen negative meningitis and encephalitis panel negative VDRL  non-reactive   AFB CSF 5/2 >>>>  BUN 90, creatinine 4.11 Albumin  less than 1.5  Reviewed CT scan of the chest 07/26/2023 Nodular densities, groundglass infiltration  Blood cultures 5/11-no growth  Respiratory culture 5/9 Abundant Staphylococcus hemolyticus, few budding yeast seen  Voriconazole  Vancomycin   Reviewed other labs  Resolved Problems  Shock  Assessment & Plan:   Metabolic encephalopathy Seizure disorder hyperammonemia - Remains on lactulose  - Will decrease dose because of ongoing diarrhea -Home Paxil  and gabapentin  - On gabapentin  300 mg 3 times daily - Dose of oxycodone  increased to 10 every 6  Multiple myeloma Received Velcade  and Cytoxan  on 07/08/2023, second dose was given 07/11/2023.  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   Acute hypoxemic respiratory failure Bilateral pleural effusions History of obstructive sleep apnea History of tobacco abuse -Is tolerating weaning -Able to be on trach collar during the day -At some point will advance to trach collar round-the-clock  Leukocytosis Sepsis - Remains on voriconazole , vancomycin  - Will send tracheal aspirate  Acute kidney injury on chronic kidney disease stage IIIa CRRT was stopped on 5/10 - Now on intermittent dialysis  Paroxysmal atrial fibrillation - Sinus rhythm - Plan  is to discontinue amiodarone  on 5/20 - Can consider anticoagulation should he go back into atrial fibrillation  Type 2 diabetes - Oncology  Appreciate palliative care involvement regarding goals of care  GOC: See IPAL from 5/7. Wife now updating goals to include 2-3 rounds of CPR maximum should he arrest.  May need LTAC once more stable  Best Practice (right click and "Reselect all SmartList Selections" daily)   Diet/type: tubefeeds  DVT prophylaxis prophylactic heparin   Pressure ulcer(s): identified on: 19/Apr/2025 GI prophylaxis: H2B Lines: Dialysis Catheter Foley:  na  Code Status:  full code Last date of multidisciplinary goals of care discussion d/w wife 5/7. See IPAL Updated spouse at bedside 07/30/2023  The patient is critically ill with multiple organ systems failure and requires high complexity decision making for assessment and support, frequent evaluation and titration of therapies, application of advanced monitoring technologies and extensive interpretation of multiple databases. Critical Care Time devoted to patient care services described in this note independent of APP/resident time (if applicable)  is 33 minutes.   Myer Artis MD [facility] Personal pager: See Amion If unanswered, please page CCM On-call: #[phone]

## 2023-08-01 ENCOUNTER — Inpatient Hospital Stay (HOSPITAL_COMMUNITY)

## 2023-08-01 DIAGNOSIS — G9341 Metabolic encephalopathy: Secondary | ICD-10-CM | POA: Diagnosis not present

## 2023-08-01 DIAGNOSIS — C9000 Multiple myeloma not having achieved remission: Secondary | ICD-10-CM | POA: Diagnosis not present

## 2023-08-01 DIAGNOSIS — B440 Invasive pulmonary aspergillosis: Secondary | ICD-10-CM | POA: Diagnosis not present

## 2023-08-01 DIAGNOSIS — J9601 Acute respiratory failure with hypoxia: Secondary | ICD-10-CM | POA: Diagnosis not present

## 2023-08-01 DIAGNOSIS — R531 Weakness: Secondary | ICD-10-CM | POA: Diagnosis not present

## 2023-08-01 LAB — COMPREHENSIVE METABOLIC PANEL WITH GFR
ALT: 11 U/L (ref 0–44)
AST: 21 U/L (ref 15–41)
Albumin: 1.8 g/dL — ABNORMAL LOW (ref 3.5–5.0)
Alkaline Phosphatase: 79 U/L (ref 38–126)
Anion gap: 13 (ref 5–15)
BUN: 109 mg/dL — ABNORMAL HIGH (ref 6–20)
CO2: 25 mmol/L (ref 22–32)
Calcium: 9.1 mg/dL (ref 8.9–10.3)
Chloride: 101 mmol/L (ref 98–111)
Creatinine, Ser: 4.93 mg/dL — ABNORMAL HIGH (ref 0.61–1.24)
GFR, Estimated: 13 mL/min — ABNORMAL LOW (ref >60)
Glucose, Bld: 150 mg/dL — ABNORMAL HIGH (ref 70–99)
Potassium: 3.2 mmol/L — ABNORMAL LOW (ref 3.5–5.1)
Sodium: 139 mmol/L (ref 135–145)
Total Bilirubin: 0.8 mg/dL (ref 0.0–1.2)
Total Protein: 8 g/dL (ref 6.5–8.1)

## 2023-08-01 LAB — CBC WITH DIFFERENTIAL/PLATELET
Abs Immature Granulocytes: 2.01 10*3/uL — ABNORMAL HIGH (ref 0.00–0.07)
Basophils Absolute: 0.2 10*3/uL — ABNORMAL HIGH (ref 0.0–0.1)
Basophils Relative: 1 %
Eosinophils Absolute: 0.5 10*3/uL (ref 0.0–0.5)
Eosinophils Relative: 3 %
HCT: 30 % — ABNORMAL LOW (ref 39.0–52.0)
Hemoglobin: 9 g/dL — ABNORMAL LOW (ref 13.0–17.0)
Immature Granulocytes: 12 %
Lymphocytes Relative: 10 %
Lymphs Abs: 1.8 10*3/uL (ref 0.7–4.0)
MCH: 30.9 pg (ref 26.0–34.0)
MCHC: 30 g/dL (ref 30.0–36.0)
MCV: 103.1 fL — ABNORMAL HIGH (ref 80.0–100.0)
Monocytes Absolute: 0.9 10*3/uL (ref 0.1–1.0)
Monocytes Relative: 5 %
Neutro Abs: 11.9 10*3/uL — ABNORMAL HIGH (ref 1.7–7.7)
Neutrophils Relative %: 69 %
Platelets: 313 10*3/uL (ref 150–400)
RBC: 2.91 MIL/uL — ABNORMAL LOW (ref 4.22–5.81)
RDW: 24.9 % — ABNORMAL HIGH (ref 11.5–15.5)
Smear Review: NORMAL
WBC: 17.3 10*3/uL — ABNORMAL HIGH (ref 4.0–10.5)
nRBC: 3.6 % — ABNORMAL HIGH (ref 0.0–0.2)
nRBC: 4 /100{WBCs} — ABNORMAL HIGH

## 2023-08-01 LAB — MISC LABCORP TEST (SEND OUT): Labcorp test code: 1024

## 2023-08-01 LAB — GLUCOSE, CAPILLARY
Glucose-Capillary: 111 mg/dL — ABNORMAL HIGH (ref 70–99)
Glucose-Capillary: 114 mg/dL — ABNORMAL HIGH (ref 70–99)
Glucose-Capillary: 115 mg/dL — ABNORMAL HIGH (ref 70–99)
Glucose-Capillary: 129 mg/dL — ABNORMAL HIGH (ref 70–99)
Glucose-Capillary: 145 mg/dL — ABNORMAL HIGH (ref 70–99)
Glucose-Capillary: 145 mg/dL — ABNORMAL HIGH (ref 70–99)
Glucose-Capillary: 151 mg/dL — ABNORMAL HIGH (ref 70–99)

## 2023-08-01 LAB — CULTURE, BLOOD (ROUTINE X 2)
Culture: NO GROWTH
Culture: NO GROWTH

## 2023-08-01 LAB — FOLATE: Folate: 17.8 ng/mL (ref >5.9)

## 2023-08-01 LAB — C-REACTIVE PROTEIN: CRP: 1.5 mg/dL — ABNORMAL HIGH (ref <1.0)

## 2023-08-01 LAB — HEMOGLOBIN AND HEMATOCRIT, BLOOD
HCT: 33.5 % — ABNORMAL LOW (ref 39.0–52.0)
Hemoglobin: 10.4 g/dL — ABNORMAL LOW (ref 13.0–17.0)

## 2023-08-01 LAB — AMMONIA: Ammonia: 46 umol/L — ABNORMAL HIGH (ref 9–35)

## 2023-08-01 LAB — PHOSPHORUS: Phosphorus: 6.1 mg/dL — ABNORMAL HIGH (ref 2.5–4.6)

## 2023-08-01 MED ORDER — HEPARIN SODIUM (PORCINE) 1000 UNIT/ML DIALYSIS
2500.0000 [IU] | Freq: Once | INTRAMUSCULAR | Status: DC
  Filled 2023-08-01: qty 3

## 2023-08-01 MED ORDER — LIDOCAINE HCL (PF) 1 % IJ SOLN: 5.0000 mL | INTRAMUSCULAR | Status: DC | PRN

## 2023-08-01 MED ORDER — HEPARIN SODIUM (PORCINE) 1000 UNIT/ML IJ SOLN
INTRAMUSCULAR | Status: AC
  Filled 2023-08-01: qty 6

## 2023-08-01 MED ORDER — LIDOCAINE-PRILOCAINE 2.5-2.5 % EX CREA: 1.0000 | TOPICAL_CREAM | CUTANEOUS | Status: DC | PRN

## 2023-08-01 MED ORDER — IOHEXOL 350 MG/ML SOLN
75.0000 mL | Freq: Once | INTRAVENOUS | Status: AC | PRN
  Administered 2023-08-01: 75 mL via INTRAVENOUS

## 2023-08-01 MED ORDER — ANTICOAGULANT SODIUM CITRATE 4% (200MG/5ML) IV SOLN: 5.0000 mL | Status: DC | PRN

## 2023-08-01 MED ORDER — PENTAFLUOROPROP-TETRAFLUOROETH EX AERO: 1.0000 | INHALATION_SPRAY | CUTANEOUS | Status: DC | PRN

## 2023-08-01 MED ORDER — LACTULOSE 10 GM/15ML PO SOLN
10.0000 g | Freq: Two times a day (BID) | ORAL | Status: DC
  Administered 2023-08-01 – 2023-08-07 (×13): 10 g
  Filled 2023-08-01 (×13): qty 15

## 2023-08-01 MED ORDER — NEPRO/CARBSTEADY PO LIQD: 237.0000 mL | ORAL | Status: DC | PRN

## 2023-08-01 MED ORDER — CLONAZEPAM 1 MG PO TABS
1.0000 mg | ORAL_TABLET | Freq: Two times a day (BID) | ORAL | Status: DC
  Administered 2023-08-01 – 2023-08-02 (×3): 1 mg
  Filled 2023-08-01 (×3): qty 1

## 2023-08-01 MED ORDER — INSULIN ASPART 100 UNIT/ML IJ SOLN
4.0000 [IU] | INTRAMUSCULAR | Status: DC
  Administered 2023-08-01 – 2023-08-02 (×7): 4 [IU] via SUBCUTANEOUS

## 2023-08-01 MED ORDER — HEPARIN SODIUM (PORCINE) 1000 UNIT/ML DIALYSIS: 1000.0000 [IU] | INTRAMUSCULAR | Status: DC | PRN

## 2023-08-01 MED ORDER — HEPARIN SODIUM (PORCINE) 1000 UNIT/ML DIALYSIS: 2500.0000 [IU] | INTRAMUSCULAR | Status: DC | PRN

## 2023-08-01 NOTE — Progress Notes (Signed)
 Frank [name].   DOB:May 22, 1965   ZO#:[id number]      ASSESSMENT & PLAN:  [name] is a 58 year old male patient transferred from [facility] mid April 2025 with approx one week history of generalized weakness, decreased appetite and altered mental status. He was admitted with concern for sepsis, PNA, and encephalopathy. Work-up was done and showed lytic lesions concerning for multiple myeloma. Oncology asked to evaluate and manage. Myeloma was confirmed and patient has responded well to oncologic therapy at this point.   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 - Initiated chemotherapy with Velcade  and Cytoxan  07/08/23 with second dose given 07/11/23.  Also given high-dose steroids.   - Good response to chemotherapy, shows decreased myeloma levels.  Last myeloma levels checked 07/22/2023. - Chemo on Hold.  May restart if protein levels trend upwards.  - Status post IVIG.  S/p G-CSF for neutropenia. - Total protein remains low 8.0 with low albumin  1.8 - Medical oncology/Dr. Maria [name] following closely   Acute respiratory failure Hypoxia - Status post intubation.  Now has trach intact, weaned from vent, still with thick mucus secretions.  Patient with trach during the days and vented at night.  Nurse reports that he was off vent overnight and has tolerated well, states concern over unresponsiveness. - Management per critical care   Anemia - Likely secondary to malignancy - HGB stable 9.0 - Recommend PRBC transfusion for hemoglobin <7.0 - Monitor CBC with differential closely   Leukocytosis/Sepsis Fevers -WBC elevated 17.3 -Still with intermittent fevers, last one 99.3, fevers not likely due to myeloma. -Continue to monitor fever curve -Continue antibiotics as ordered -Monitor BC with differential   CKD - was on CRRT, transferred to [facility] for HD, next session scheduled today.   Nurse reports small amount of urine produced.  - Nephrology managing   Altered mental status Acute metabolic encephalopathy - Follows simple commands although today patient did not engage - Continue supportive care   Hypertension Hyperlipidemia Diabetes - Monitor blood pressure levels - Monitor blood glucose levels, insulin  coverage per protocol - Medicine following      Code Status Full   Subjective:  Patient seen awake today, did not follow any commands. Trach intact. Discussed with nurse, patient stayed off vent overnight and so far tolerating well. Concern over continued lack of responses.  He is chronically ill-appearing.    Objective:   Intake/Output Summary (Last 24 hours) at 08/01/2023 0939 Last data filed at 08/01/2023 0900 Gross per 24 hour  Intake 1499.62 ml  Output 1200 ml  Net 299.62 ml     PHYSICAL EXAMINATION: ECOG PERFORMANCE STATUS: 4 - Bedbound  Vitals:   08/01/23 0755 08/01/23 0800  BP:  (!) 157/87  Pulse: 93 (!) 104  Resp: 18 18  Temp:    SpO2: 98% 97%   Filed Weights   07/29/23 0500 07/30/23 0827 07/31/23 0547  Weight: 220 lb 7.4 oz (100 kg) 220 lb 7.4 oz (100 kg) 225 lb 8.5 oz (102.3 kg)    GENERAL: awake, +chronically ill-appearing SKIN: skin color, texture, turgor are normal, no rashes or significant lesions EYES: normal, conjunctiva are pink and non-injected, sclera clear OROPHARYNX:+trach intact NECK: supple, thyroid normal size, non-tender, without nodularity LYMPH: no palpable lymphadenopathy in the cervical, axillary or inguinal LUNGS: +coarse breath sounds HEART: regular rate & rhythm and no murmurs and no lower extremity edema ABDOMEN: abdomen soft, non-tender and normal bowel  sounds MUSCULOSKELETAL: bil LE edema 1+  NEURO: +altered mentation   All questions were answered. The patient knows to call the clinic with any problems, questions or concerns.   The total time spent in the appointment was 30 minutes encounter with  patient including review of chart and various tests results, discussions about plan of care and coordination of care plan  Frank Mate, NP 08/01/2023 9:39 AM    Labs Reviewed:  Lab Results  Component Value Date   WBC 17.3 (H) 08/01/2023   HGB 9.0 (L) 08/01/2023   HCT 30.0 (L) 08/01/2023   MCV 103.1 (H) 08/01/2023   PLT 313 08/01/2023   Recent Labs    07/23/23 0416 07/23/23 1722 07/30/23 0424 07/31/23 0509 08/01/23 0516  NA 128*   < > 139 136 139  K 4.2   < > 3.7 3.6 3.2*  CL 99   < > 101 99 101  CO2 24   < > 23 26 25   GLUCOSE 128*   < > 123* 129* 150*  BUN 33*   < > 83* 70* 109*  CREATININE 1.62*   < > 4.40* 3.87* 4.93*  CALCIUM  7.5*   < > 8.1* 8.4* 9.1  GFRNONAA 49*   < > 15* 17* 13*  PROT 8.5*   < > 8.0 8.5* 8.0  ALBUMIN  <1.5*  <1.5*   < > 1.7* 1.9* 1.8*  AST 19   < > 12* 16 21  ALT 14   < > 8 10 11   ALKPHOS 59   < > 76 90 79  BILITOT 0.5   < > 0.8 0.6 0.8  BILIDIR <0.1  --   --   --   --   IBILI NOT CALCULATED  --   --   --   --    < > = values in this interval not displayed.    Studies Reviewed:  MR BRAIN WO CONTRAST Result Date: 07/26/2023 CLINICAL DATA:  Follow-up examination for aspergillosis, altered mental status, immunosuppressed. EXAM: MRI HEAD WITHOUT CONTRAST TECHNIQUE: Multiplanar, multiecho pulse sequences of the brain and surrounding structures were obtained without intravenous contrast. COMPARISON:  MRI from 07/18/2023. FINDINGS: Brain: Cerebral volume within normal limits for age. Patchy T2/FLAIR hyperintensity involving the supratentorial cerebral white matter, most like related chronic microvascular ischemic disease, stable. No evidence for acute or subacute infarct. No areas of chronic cortical infarction or other insult. No acute intracranial hemorrhage. Single punctate chronic microhemorrhage noted at the left frontal corona radiata, stable. No mass lesion, midline shift or mass effect. Ventricles stable in size without hydrocephalus. No  extra-axial fluid collection. Pituitary gland and suprasellar region within normal limits. No evidence for acute CNS infection by MRI. Vascular: Major intracranial vascular flow voids are stable and maintained. Skull and upper cervical spine: Craniocervical junction within normal limits. Diffusely abnormal appearance of the visualized osseous structures, consistent with Mets or myeloma, relatively stable from prior. No scalp soft tissue abnormality. Sinuses/Orbits: Globes and orbital soft tissues within normal limits. Scattered mucosal thickening present about the sphenoid and maxillary sinuses. Large right with moderate left mastoid effusions. Other: Incidental cystic lesion at the nasal bridge again noted, likely a dermoid/epidermoid, stable. IMPRESSION: 1. Stable brain MRI. No acute intracranial abnormality. 2. Diffusely abnormal appearance of the visualized osseous structures, concerning for metastatic disease or myeloma, stable. 3. Moderate cerebral white matter disease, nonspecific, but most commonly related to chronic microvascular ischemic disease. 4. Large right with moderate left mastoid effusions, of uncertain significance.  Correlation with physical exam recommended. Electronically Signed   By: Virgia Griffins M.D.   On: 07/26/2023 18:25   DG Chest Port 1 View Result Date: 07/26/2023 CLINICAL DATA:  [id number] Increased tracheal secretions [id number] EXAM: PORTABLE CHEST 1 VIEW COMPARISON:  Jul 24, 2023 FINDINGS: The cardiomediastinal silhouette is unchanged in contour.Tracheostomy. The enteric tube courses through the chest to the abdomen with weighted metallic tip projecting over the stomach. RIGHT IJ CVC catheter tip terminates over the SVC. No pleural effusion. No pneumothorax. Perihilar vascular fullness. There is bronchial wall cuffing. Biapical airspace opacities and LEFT retrocardiac airspace opacity. Advanced degenerative changes of the shoulders. IMPRESSION: 1. Bronchial wall thickening with  patchy bilateral airspace opacities likely reflect multifocal infection. Differential considerations include pulmonary edema. Electronically Signed   By: Clancy Crimes M.D.   On: 07/26/2023 10:31   CT CHEST WO CONTRAST Result Date: 07/26/2023 CLINICAL DATA:  Hypoxic respiratory failure. Evaluate for changes suggestive of invasive fungal infection. EXAM: CT CHEST WITHOUT CONTRAST TECHNIQUE: Multidetector CT imaging of the chest was performed following the standard protocol without IV contrast. RADIATION DOSE REDUCTION: This exam was performed according to the departmental dose-optimization program which includes automated exposure control, adjustment of the mA and/or kV according to patient size and/or use of iterative reconstruction technique. COMPARISON:  07/20/2023 FINDINGS: Cardiovascular: Heart size is normal. There is no pericardial effusion. Aortic atherosclerosis and coronary artery calcifications. Mediastinum/Nodes: Tracheostomy tube tip is above the carina. Enteric tube tip is in the body of the stomach. No mediastinal or hilar adenopathy. Lungs/Pleura: Decreased volume of bilateral pleural effusions. Partial atelectasis of bilateral lower lobes identified. Progressive ground-glass and airspace densities within the left upper lobe noted. There also multiple nodular densities with surrounding ground-glass attenuation throughout both upper lung zones, also increased from the previous exam. Upper Abdomen: No acute abnormality. Nonobstructing stone within the upper pole of the right kidney, image 154/2. Musculoskeletal: Multiple lucent bone lesions are identified throughout the visualized bony thorax. These appears similar in distribution to the previous exam. Unchanged mild compression deformity involving the superior endplate of the T11 vertebra. IMPRESSION: 1. Decreased volume of bilateral pleural effusions. 2. Progressive ground-glass and airspace densities within the left upper lobe. There also  multiple nodular densities with surrounding ground-glass attenuation throughout both upper lung zones, also increased from the previous exam. Findings are favored to represent multifocal pneumonia. Nodular densities with surrounding ground-glass attenuation may be seen in the setting of fungal infection particularly and anion 0 compromised in visual. 3. Multiple lucent bone lesions are identified throughout the visualized bony thorax. These appears similar in distribution to the previous exam. Unchanged mild compression deformity involving the superior endplate of the T11 vertebra. Imaging findings are concerning for metastatic disease versus multiple myeloma. 4. Nonobstructing right renal stone. 5. Coronary artery calcifications. 6.  Aortic Atherosclerosis (ICD10-I70.0). Electronically Signed   By: Kimberley Penman M.D.   On: 07/26/2023 04:56   VAS US  UPPER EXTREMITY VENOUS DUPLEX Result Date: 07/25/2023 UPPER VENOUS STUDY  Patient Name:  Frank [name].  Date of Exam:   07/24/2023 Medical Rec #: [id number]           Accession #:    [id number] Date of Birth: [birth year]           Patient Gender: M Patient Age:   31 years Exam Location:  [facility] Procedure:      VAS US  UPPER EXTREMITY VENOUS DUPLEX Referring Phys: Donavon Fudge HOFFMAN --------------------------------------------------------------------------------  Indications: Edema Risk Factors: Multiple  myeloma / chemotherapy. Comparison Study: No previous exams Performing Technologist: Jody Hill RVT, RDMS  Examination Guidelines: A complete evaluation includes B-mode imaging, spectral Doppler, color Doppler, and power Doppler as needed of all accessible portions of each vessel. Bilateral testing is considered an integral part of a complete examination. Limited examinations for reoccurring indications may be performed as noted.  Right Findings: Subclvian vein not visualized due to patient recieving HD bedside  Left Findings:  +----------+------------+---------+-----------+----------+---------------------+ LEFT      CompressiblePhasicitySpontaneousProperties       Summary        +----------+------------+---------+-----------+----------+---------------------+ IJV           Full       Yes       Yes                                    +----------+------------+---------+-----------+----------+---------------------+ Subclavian    Full       Yes       Yes                                    +----------+------------+---------+-----------+----------+---------------------+ Axillary      Full       Yes       Yes                                    +----------+------------+---------+-----------+----------+---------------------+ Brachial      Full       Yes       Yes                                    +----------+------------+---------+-----------+----------+---------------------+ Radial        Full                                                        +----------+------------+---------+-----------+----------+---------------------+ Ulnar         Full                                   only one of paired                                                            visualized       +----------+------------+---------+-----------+----------+---------------------+ Cephalic      Full                                                        +----------+------------+---------+-----------+----------+---------------------+ Basilic       Full       Yes       Yes                                    +----------+------------+---------+-----------+----------+---------------------+  Large non vascularized heterogenous area in mid and distal upper arm with cystic and solid appearing areas.  Summary:  Right: Unable to visualize subclavian vein on this exam.  Left: No evidence of deep vein thrombosis in the upper extremity. No evidence of superficial vein thrombosis in the upper extremity. Large non  vascularized heterogenous area in mid and distal upper arm with cystic and solid appearing areas.  *See table(s) above for measurements and observations.  Diagnosing physician: Runell Countryman Electronically signed by Runell Countryman on 07/25/2023 at 9:32:22 AM.    Final    DG Chest Port 1 View Result Date: 07/24/2023 CLINICAL DATA:  Status post tracheostomy. EXAM: PORTABLE CHEST 1 VIEW COMPARISON:  Chest radiograph dated 07/22/2023. FINDINGS: Tracheostomy with tip approximately 5.5 cm above the carina. Enteric tube extends below the diaphragm with tip beyond the inferior margin of the image. Right IJ central venous line in similar position. Similar appearance of bilateral pulmonary opacities. No pleural effusion or pneumothorax. The cardiac silhouette. No acute osseous pathology. IMPRESSION: 1. Tracheostomy with tip approximately 5.5 cm above the carina. 2. Similar appearance of bilateral pulmonary opacities. Electronically Signed   By: Angus Bark M.D.   On: 07/24/2023 14:17   DG CHEST PORT 1 VIEW Result Date: 07/22/2023 CLINICAL DATA:  Aspiration pneumonia. EXAM: PORTABLE CHEST 1 VIEW COMPARISON:  Jul 18, 2023. FINDINGS: Stable cardiomediastinal silhouette. Tracheostomy and feeding tubes are unchanged. Right internal jugular catheter is unchanged. Increased bilateral lung opacities are noted diffusely consistent with edema or atypical inflammation. Severe degenerative changes are seen involving both glenohumeral joints. IMPRESSION: Stable support apparatus. Increased bilateral lung opacities concerning for edema or pneumonia. Electronically Signed   By: Rosalene Colon M.D.   On: 07/22/2023 14:39   DG Abd Portable 1V Result Date: 07/22/2023 CLINICAL DATA:  Ileus. EXAM: PORTABLE ABDOMEN - 1 VIEW COMPARISON:  July 07, 2023. FINDINGS: The bowel gas pattern is normal. Distal tip of feeding tube is seen in expected position of proximal stomach. No radio-opaque calculi or other significant radiographic abnormality  are seen. IMPRESSION: No abnormal bowel dilatation. Electronically Signed   By: Rosalene Colon M.D.   On: 07/22/2023 14:38   CT CHEST WO CONTRAST Result Date: 07/20/2023 CLINICAL DATA:  Sepsis EXAM: CT CHEST WITHOUT CONTRAST TECHNIQUE: Multidetector CT imaging of the chest was performed following the standard protocol without IV contrast. RADIATION DOSE REDUCTION: This exam was performed according to the departmental dose-optimization program which includes automated exposure control, adjustment of the mA and/or kV according to patient size and/or use of iterative reconstruction technique. COMPARISON:  None Available. FINDINGS: Cardiovascular: No significant vascular findings. The heart is mildly enlarged. There is no pericardial effusion. Aorta is normal in size. Central venous catheter tip ends in the SVC. Mediastinum/Nodes: Endotracheal tube tip is 2.4 cm above the carina. Enteric tube is seen throughout nondilated esophagus. No enlarged lymph nodes are identified allowing for lack of intravenous contrast. The visualized thyroid gland is within normal limits. Lungs/Pleura: There are small bilateral pleural effusions. There is bilateral lower lobe airspace consolidation. Multifocal patchy airspace areas and ill-defined ground-glass slightly nodular opacities are seen throughout the bilateral upper lobes and minimally in the right middle lobe, also likely infectious/inflammatory. There is no pneumothorax. Upper Abdomen: Gallstones are present. Enteric tube is partially visualized in the stomach. Musculoskeletal: There are severe degenerative changes in both shoulders. The bones are diffusely osteopenic. There are scattered lucent lesions throughout the thoracic spine measuring up to 8 mm. There is minimal  compression deformity of the superior endplate of T11. IMPRESSION: 1. Bilateral lower lobe airspace consolidation compatible with pneumonia. 2. Multifocal patchy airspace areas and ill-defined ground-glass  slightly nodular opacities throughout the bilateral upper lobes and minimally in the right middle lobe, also likely infectious/inflammatory. 3. Small bilateral pleural effusions. 4. Mild cardiomegaly. 5. Cholelithiasis. 6. Scattered lucent lesions throughout the thoracic spine measuring up to 8 mm, nonspecific. Findings may be related to multiple myeloma or metastatic disease. 7. Minimal compression deformity of the superior endplate of T11. Electronically Signed   By: Tyron Gallon M.D.   On: 07/20/2023 16:31   VAS US  LOWER EXTREMITY VENOUS (DVT) Result Date: 07/18/2023  Lower Venous DVT Study Patient Name:  Frank [name].  Date of Exam:   07/18/2023 Medical Rec #: [id number]           Accession #:    [id number] Date of Birth: 1965/12/13           Patient Gender: M Patient Age:   42 years Exam Location:  [facility] Procedure:      VAS US  LOWER EXTREMITY VENOUS (DVT) Referring Phys: ADITYA PALIWAL --------------------------------------------------------------------------------  Indications: Swelling, and Edema.  Risk Factors: Immobility Cancer myeloma. Limitations: Patient positioning. Comparison Study: None. Performing Technologist: Estanislao Heimlich  Examination Guidelines: A complete evaluation includes B-mode imaging, spectral Doppler, color Doppler, and power Doppler as needed of all accessible portions of each vessel. Bilateral testing is considered an integral part of a complete examination. Limited examinations for reoccurring indications may be performed as noted. The reflux portion of the exam is performed with the patient in reverse Trendelenburg.  +-----+---------------+---------+-----------+----------+--------------+ RIGHTCompressibilityPhasicitySpontaneityPropertiesThrombus Aging +-----+---------------+---------+-----------+----------+--------------+ CFV  Full           Yes      Yes                                  +-----+---------------+---------+-----------+----------+--------------+ Large hematoma noted at top of right leg  +---------+---------------+---------+-----------+----------+-------------------+ LEFT     CompressibilityPhasicitySpontaneityPropertiesThrombus Aging      +---------+---------------+---------+-----------+----------+-------------------+ CFV      Full           Yes      Yes                                      +---------+---------------+---------+-----------+----------+-------------------+ SFJ      Full                                                             +---------+---------------+---------+-----------+----------+-------------------+ FV Prox  Full                                                             +---------+---------------+---------+-----------+----------+-------------------+ FV Mid   Full                                                             +---------+---------------+---------+-----------+----------+-------------------+  FV DistalFull                                                             +---------+---------------+---------+-----------+----------+-------------------+ PFV      Full                                                             +---------+---------------+---------+-----------+----------+-------------------+ POP      Full           Yes      Yes                                      +---------+---------------+---------+-----------+----------+-------------------+ PTV                              Yes                  Not well visualized +---------+---------------+---------+-----------+----------+-------------------+ PERO                             Yes                  Not well visualized +---------+---------------+---------+-----------+----------+-------------------+     Summary: RIGHT: - No evidence of common femoral vein obstruction.   LEFT: - There is no evidence of deep vein thrombosis in the  lower extremity.  - No cystic structure found in the popliteal fossa.  *See table(s) above for measurements and observations. Electronically signed by Delaney Fearing on 07/18/2023 at 4:52:00 PM.    Final    DG Chest 1 View Result Date: 07/18/2023 CLINICAL DATA:  Endotracheal tube. EXAM: CHEST  1 VIEW COMPARISON:  Jul 17, 2023. FINDINGS: Endotracheal and feeding tube are in grossly good position. Right internal jugular catheter is unchanged. Mild bibasilar subsegmental atelectasis is noted with small pleural effusions. Degenerative changes are seen involving both shoulder joints. IMPRESSION: Support apparatus as noted above. Bibasilar atelectasis and probable small pleural effusions are noted. Electronically Signed   By: Rosalene Colon M.D.   On: 07/18/2023 15:58   DG FL GUIDED LUMBAR PUNCTURE Result Date: 07/18/2023 CLINICAL DATA:  Metabolic encephalopathy EXAM: DIAGNOSTIC LUMBAR PUNCTURE UNDER FLUOROSCOPIC GUIDANCE COMPARISON:  I assessed the brain imaging of 07/17/2023 and the CT abdomen of 07/08/2023 in preparation for this exam. FLUOROSCOPY: Radiation Exposure Index (as provided by the fluoroscopic device): 3.4 mGy Kerma PROCEDURE: The risks (including hemorrhage, infection, and nerve damage, among others), benefits, and alternatives to fluoroscopically guided lumbar puncture were discussed with the patient's wife by telephone, as the patient is unable to consent and has altered mental status. The patient's wife understood and elected for the patient to undergo the procedure. Standard time-out was employed. Following sterile skin prep and local anesthetic administration consisting of 1 percent lidocaine , a 22 gauge spinal needle was advanced without difficulty into the thecal sac at the L2-3 level under fluoroscopic guidance. Clear CSF was returned. Because the patient is  intubated, I did not turn him onto his side to obtain a pressure measurement. A total of 11.5 cc of clear CSF was collected in 4 vials. The  needle was subsequently removed and the skin cleansed and bandaged. No immediate complications were observed. IMPRESSION: Technically successful fluoroscopically guided lumbar puncture at the L2-3 level. Electronically Signed   By: Freida Jes M.D.   On: 07/18/2023 13:39   MR BRAIN WO CONTRAST Result Date: 07/18/2023 CLINICAL DATA:  Delirium EXAM: MRI HEAD WITHOUT CONTRAST TECHNIQUE: Multiplanar, multiecho pulse sequences of the brain and surrounding structures were obtained without intravenous contrast. COMPARISON:  CT head 07/17/2023. FINDINGS: Brain: No acute infarct. Punctate susceptibility in the left corona radiata suggestive of chronic microhemorrhage. No additional foci of hemorrhage appreciated. Scattered and confluent T2/FLAIR hyperintensity in the periventricular and subcortical white matter. No mass lesion or midline shift. Cerebellum is unremarkable. Normal appearance of midline structures. The basilar cisterns are patent. No extra-axial fluid collections. Ventricles: Normal size and configuration of the ventricles. Vascular: Skull base flow voids are visualized. Skull and upper cervical spine: Facet arthrosis in the visualized upper cervical spine most pronounced on the right at C3-4. Redemonstration of multiple calvarial lesions corresponding to lytic lesions on CT. Sinuses/Orbits: The orbits are symmetric. Minimal mucosal thickening in the alveolar recess of the right maxillary sinus. There is a 1.3 x 1.0 x 1.5 cm circumscribed cystic appearing lesion in the subcutaneous tissues overlying the nasal bones slightly right of midline which demonstrates internal restricted diffusion. No evidence of connection to the intracranial compartment. Other: Large right mastoid effusion. Trace fluid in the left mastoid air cells. Fluid within the nasopharynx. Partially visualized enteric tube and endotracheal tube. IMPRESSION: No acute intracranial abnormality. Nonspecific white matter signal abnormality  likely reflecting moderate chronic microvascular ischemic changes. Multiple calvarial lesions corresponding to lytic lesions on CT. Findings concerning for multiple myeloma versus metastasis. 1.5 cm cystic lesion in the subcutaneous tissues overlying the nasal bones likely reflecting epidermoid cyst versus dermoid. No evidence of connection to the intracranial compartment. Large right mastoid effusion. Electronically Signed   By: Denny Flack M.D.   On: 07/18/2023 12:54   CT HEAD WO CONTRAST ( ) Result Date: 07/17/2023 CLINICAL DATA:  58 year old male altered mental status. Respiratory failure. "Myeloma". EXAM: CT HEAD WITHOUT CONTRAST TECHNIQUE: Contiguous axial images were obtained from the base of the skull through the vertex without intravenous contrast. RADIATION DOSE REDUCTION: This exam was performed according to the departmental dose-optimization program which includes automated exposure control, adjustment of the mA and/or kV according to patient size and/or use of iterative reconstruction technique. COMPARISON:  No prior head CT.  CT Abdomen and Pelvis 07/08/2023. FINDINGS: Brain: Cerebral volume is within normal limits for age. No midline shift, ventriculomegaly, mass effect, evidence of mass lesion, intracranial hemorrhage or evidence of cortically based acute infarction. Gray-white differentiation maintained, mild to moderate for age scattered cerebral white matter hypodensity. Vascular: Calcified atherosclerosis at the skull base. No suspicious intracranial vascular hyperdensity. Skull: Scattered nonspecific round an oval lucent areas in the bilateral calvarium. No pathologic fracture identified. Sinuses/Orbits: Intubated. Right nasoenteric tube in place. Relatively well aerated paranasal sinuses, partial opacification of the right middle ear and mastoids. Other: Visualized orbits and scalp soft tissues are within normal limits. IMPRESSION: 1. No acute intracranial abnormality. Mild to moderate  for age cerebral white matter changes most commonly due to small vessel disease. 2. Scattered lytic lesions in the skull, similar to the skeletal findings on 07/08/2023, and highly suspicious  for Multiple Myeloma versus lytic metastases. 3. Intubated. Electronically Signed   By: Marlise Simpers M.D.   On: 07/17/2023 14:37   DG CHEST PORT 1 VIEW Result Date: 07/17/2023 CLINICAL DATA:  Acute respiratory failure.  Hypoxia. EXAM: PORTABLE CHEST 1 VIEW COMPARISON:  07/13/2023 FINDINGS: A feeding tube passes into the stomach although the distal tip position is not included on the film. Right IJ central line tip overlies the proximal SVC level. Low volume film with interval development of vascular congestion basilar predominant interstitial opacity, concerning for edema. Probable small left effusion. Cardiopericardial silhouette is at upper limits of normal for size. Degenerative changes noted in both shoulders. Telemetry leads overlie the chest. IMPRESSION: Low volume film with interval development of vascular congestion and basilar predominant interstitial opacity, concerning for edema. Probable small left effusion. Electronically Signed   By: Donnal Fusi M.D.   On: 07/17/2023 07:41   DG Chest Port 1 View Result Date: 07/13/2023 CLINICAL DATA:  Respiratory failure EXAM: PORTABLE CHEST 1 VIEW COMPARISON:  07/09/2023 FINDINGS: Support apparatus: Endotracheal tube terminates 5.9 cm above carina. Right IJ Cordis sheath unchanged. Feeding tube extends beyond the inferior aspect of the film. Heart/mediastinum: Midline trachea.  Normal heart size. Pleura: Possible trace left pleural fluid.  No pneumothorax. Lungs: Persistent left and increased right base airspace disease. Other: Right glenohumeral joint degenerative change. IMPRESSION: Similar left and increased right base airspace disease, favoring atelectasis. Possible trace left pleural fluid. Electronically Signed   By: Lore Rode M.D.   On: 07/13/2023 09:39   EEG  adult Result Date: 07/11/2023 Arleene Lack, MD     07/11/2023  3:28 PM Patient Name: Frank [name]. MRN: [id number] Epilepsy Attending: Arleene Lack Referring Physician/Provider: Casimiro Cleaves, PA-C Date: 07/11/2023 Duration: 24.55 mins Patient history: 58yo M with ams. EEG to evaluate for seizure Level of alertness:  comatose/ lethargic AEDs during EEG study: VPA Technical aspects: This EEG study was done with scalp electrodes positioned according to the 10-20 International system of electrode placement. Electrical activity was reviewed with band pass filter of 1-70Hz , sensitivity of 7 uV/mm, display speed of 33mm/sec with a 60Hz  notched filter applied as appropriate. EEG data were recorded continuously and digitally stored.  Video monitoring was available and reviewed as appropriate. Description: EEG showed continuous generalized 3-5 hz theta-delta slowing, at times with triphasic morphology.  Hyperventilation and photic stimulation were not performed.   ABNORMALITY - Continuous slow, generalized IMPRESSION: This study is suggestive of moderate to severe encephalopathy, could be secondary to toxic metabolic causes. No seizures or epileptiform discharges were seen throughout the recording. Arleene Lack   DG Chest 1 View Result Date: 07/09/2023 CLINICAL DATA:  Status post central line placement EXAM: PORTABLE CHEST 1 VIEW COMPARISON:  07/08/2023 FINDINGS: Endotracheal tube and feeding catheter are noted in satisfactory position. New right jugular catheter is seen in satisfactory position. No pneumothorax is noted. The overall inspiratory effort is poor with crowding of the vascular markings. IMPRESSION: Poor inspiratory effort. No pneumothorax following central line placement. Electronically Signed   By: Violeta Grey M.D.   On: 07/09/2023 11:19   CT ABDOMEN PELVIS WO CONTRAST Result Date: 07/08/2023 CLINICAL DATA:  Palpable right groin mass. Anemia requiring 3 units of packed red blood cells.  Sepsis due to acquired pneumonia. Lytic bone lesions with a clinical concern for a new diagnosis of multiple myeloma. Type 2 diabetes. EXAM: CT ABDOMEN AND PELVIS WITHOUT CONTRAST TECHNIQUE: Multidetector CT imaging of the abdomen and  pelvis was performed following the standard protocol without IV contrast. RADIATION DOSE REDUCTION: This exam was performed according to the departmental dose-optimization program which includes automated exposure control, adjustment of the mA and/or kV according to patient size and/or use of iterative reconstruction technique. COMPARISON:  CT-guided right iliac bone marrow aspiration and core biopsy dated 07/04/2023. FINDINGS: Lower chest: Multifocal patchy opacities in both lower lobes. Small left pleural effusion and minimal right pleural effusion. Borderline enlarged heart. Hepatobiliary: Normal-appearing liver. Dependent calcification in the gallbladder without gallbladder wall thickening or pericholecystic fluid. Pancreas: Unremarkable. No pancreatic ductal dilatation or surrounding inflammatory changes. Spleen: Normal in size without focal abnormality. Adrenals/Urinary Tract: Foley catheter in the urinary bladder with no significant urine in the bladder. Single tiny calculus in each kidney. Small, exophytic high density left renal cyst compatible with a hemorrhagic or proteinaceous cyst. Probable subcentimeter exophytic left renal cyst, difficult to assess due to streak artifacts produced by the patient's left arm. This could be assessed with renal ultrasound. Unremarkable adrenal glands and ureters. Stomach/Bowel: Stomach is within normal limits. Appendix appears normal. No evidence of bowel wall thickening, distention, or inflammatory changes. Vascular/Lymphatic: Atheromatous arterial calcifications without aneurysm. No enlarged lymph nodes. Reproductive: Normal-sized prostate gland containing coarse calcifications. Other: Large right buttock and groin hematoma containing a  fluid/hematocrit level. This measures 36.0 x 11.4 x 9.7 cm in maximum dimensions on sagittal and coronal images 32/7 and 39/6 respectively. Musculoskeletal: Left hip prosthesis with associated streak artifacts. Nondisplaced right 10th posterolateral rib fracture. Old, healed left posterolateral 10th rib fracture. Multiple small to medium-sized lytic lesions in multiple lumbar and lower thoracic vertebral bodies and in the pelvic bones bilaterally. Moderate right hip degenerative changes. IMPRESSION: 1. Large acute right buttock and groin hematoma measuring 36.0 x 11.4 x 9.7 cm. 2. Nondisplaced right 10th posterolateral rib fracture. 3. Multiple small to medium-sized lytic lesions in multiple lumbar and lower thoracic vertebral bodies and in the pelvic bones bilaterally, compatible with the clinical suspicion of multiple myeloma. Lytic metastases can also have this appearance. 4. Multifocal patchy opacities in both lower lobes, compatible with known pneumonia. 5. Small left pleural effusion and minimal right pleural effusion. 6. Cholelithiasis. 7. Tiny bilateral renal calculi. 8. Probable subcentimeter exophytic left renal cyst, difficult to assess due to streak artifacts produced by the patient's left arm. This could be assessed with renal ultrasound. Electronically Signed   By: Catherin Closs M.D.   On: 07/08/2023 16:07   DG CHEST PORT 1 VIEW Result Date: 07/08/2023 CLINICAL DATA:  [id number] Endotracheally intubated [id number] EXAM: PORTABLE CHEST 1 VIEW COMPARISON:  07/08/2023 FINDINGS: The endotracheal tube is positioned just to the left of midline, likely within the mid trachea, likely due to patient rotation. Mild cardiomegaly. No focal airspace consolidation, pleural effusion, or pneumothorax. No cardiomegaly. No acute fracture or destructive lesion. Moderate osteoarthritis of the right shoulder. Multilevel degenerative disc disease of the spine. Weighted feeding tube courses below the diaphragm with the distal tip  not included in the field of view. IMPRESSION: 1. The endotracheal tube is positioned just to the left of midline, likely within the mid trachea, but abnormally positioned due to patient rotation. No pneumothorax. 2. Weighted feeding tube courses below the diaphragm, distal tip outside the field of view. Electronically Signed   By: Rance Burrows M.D.   On: 07/08/2023 14:02   DG CHEST PORT 1 VIEW Result Date: 07/08/2023 CLINICAL DATA:  Dyspnea. EXAM: PORTABLE CHEST 1 VIEW COMPARISON:  07/06/2023 FINDINGS: Feeding tube extends down  the esophagus into the stomach. Tube appears to be coiled in the stomach but the tip is not clearly identified. Heart size is within normal limits and stable. Prominent vascular and interstitial lung markings are unchanged. No focal airspace disease or lung consolidation. Again noted is severe joint space narrowing and probable degenerative changes in both shoulders. Trachea is midline. IMPRESSION: 1. Again noted are prominent vascular and interstitial lung markings. Findings could represent vascular congestion or mild edema. Minimal change since 07/06/2023. 2. Feeding tube extends into the abdomen. Electronically Signed   By: Elene Griffes M.D.   On: 07/08/2023 10:25   DG Abd 1 View Result Date: 07/07/2023 CLINICAL DATA:  Nasogastric tube placement. EXAM: ABDOMEN - 1 VIEW COMPARISON:  None Available. FINDINGS: Weighted enteric tube tip in the left upper abdomen in the region of the mid or distal stomach. No bowel dilatation in the included upper abdomen. IMPRESSION: Weighted enteric tube tip in the mid or distal stomach. Electronically Signed   By: Chadwick Colonel M.D.   On: 07/07/2023 17:08   DG Fluoro Rm 1-60 Min - No Report Result Date: 07/07/2023 CLINICAL DATA:  Acute encephalopathy, nasogastric tube placement requested. EXAM: FLUORO RM 1-60 MIN-NO REPORT FLUOROSCOPY: Fluoroscopy Time:  0 minutes 48 seconds Radiation Exposure Index (if provided by the fluoroscopic device): 7.0  mGy Number of Acquired Spot Images: 1 COMPARISON:  None Available. FINDINGS: Multiple attempts at nasogastric tube placement with a 20 French catheter resulted in right or left mainstem bronchus positioning. Patient was unable to swallow or follow directions to aid with insertion. Study was terminated. IMPRESSION: Unsuccessful nasogastric tube placement under fluoroscopy. Electronically Signed   By: Shearon Denis M.D.   On: 07/07/2023 14:13   ECHOCARDIOGRAM COMPLETE Result Date: 07/07/2023    ECHOCARDIOGRAM REPORT   Patient Name:   Frank [name]. Date of Exam: 07/07/2023 Medical Rec #:  [id number]          Height:       74.0 in Accession #:    [id number]         Weight:       236.6 lb Date of Birth:  [birth year]          BSA:          2.334 m Patient Age:    58 years           BP:           141/88 mmHg Patient Gender: M                  HR:           110 bpm. Exam Location:  Inpatient Procedure: 3D Echo, Cardiac Doppler, Color Doppler and Intracardiac            Opacification Agent (Both Spectral and Color Flow Doppler were            utilized during procedure). Indications:    Atrial fibrillation  History:        Patient has no prior history of Echocardiogram examinations.  Sonographer:    Juanita Shaw Referring Phys: [id number] UUVOZD PARKER  Sonographer Comments: Image acquisition challenging due to patient body habitus and Image acquisition challenging due to respiratory motion. IMPRESSIONS  1. Left ventricular ejection fraction, by estimation, is 65 to 70%. The left ventricle has normal function. The left ventricle has no regional wall motion abnormalities. Left ventricular diastolic parameters are indeterminate.  2. Right ventricular systolic function is normal. The right  ventricular size is normal. There is normal pulmonary artery systolic pressure. The estimated right ventricular systolic pressure is 35.8 mmHg.  3. Left atrial size was moderately dilated.  4. The mitral valve is normal in structure. No  evidence of mitral valve regurgitation. No evidence of mitral stenosis.  5. The aortic valve is tricuspid. Aortic valve regurgitation is not visualized. Aortic valve sclerosis/calcification is present, without any evidence of aortic stenosis. Aortic valve mean gradient measures 8.0 mmHg.  6. Aortic dilatation noted. There is mild dilatation of the aortic root, measuring 39 mm.  7. The inferior vena cava is dilated in size with <50% respiratory variability, suggesting right atrial pressure of 15 mmHg.  8. The patient was in atrial fibrillation. FINDINGS  Left Ventricle: Left ventricular ejection fraction, by estimation, is 65 to 70%. The left ventricle has normal function. The left ventricle has no regional wall motion abnormalities. The left ventricular internal cavity size was normal in size. There is  no left ventricular hypertrophy. Left ventricular diastolic parameters are indeterminate. Right Ventricle: The right ventricular size is normal. No increase in right ventricular wall thickness. Right ventricular systolic function is normal. There is normal pulmonary artery systolic pressure. The tricuspid regurgitant velocity is 2.28 m/s, and  with an assumed right atrial pressure of 15 mmHg, the estimated right ventricular systolic pressure is 35.8 mmHg. Left Atrium: Left atrial size was moderately dilated. Right Atrium: Right atrial size was normal in size. Pericardium: There is no evidence of pericardial effusion. Mitral Valve: The mitral valve is normal in structure. No evidence of mitral valve regurgitation. No evidence of mitral valve stenosis. MV peak gradient, 5.4 mmHg. The mean mitral valve gradient is 2.0 mmHg. Tricuspid Valve: The tricuspid valve is normal in structure. Tricuspid valve regurgitation is trivial. Aortic Valve: The aortic valve is tricuspid. Aortic valve regurgitation is not visualized. Aortic valve sclerosis/calcification is present, without any evidence of aortic stenosis. Aortic valve mean  gradient measures 8.0 mmHg. Aortic valve peak gradient measures 16.0 mmHg. Aortic valve area, by VTI measures 2.25 cm. Pulmonic Valve: The pulmonic valve was normal in structure. Pulmonic valve regurgitation is not visualized. Aorta: Aortic dilatation noted. There is mild dilatation of the aortic root, measuring 39 mm. Venous: The inferior vena cava is dilated in size with less than 50% respiratory variability, suggesting right atrial pressure of 15 mmHg. IAS/Shunts: No atrial level shunt detected by color flow Doppler.  LEFT VENTRICLE PLAX 2D LVIDd:         5.00 cm      Diastology LVIDs:         3.60 cm      LV e' medial:    7.94 cm/s LV PW:         0.60 cm      LV E/e' medial:  17.0 LV IVS:        0.90 cm      LV e' lateral:   12.90 cm/s LVOT diam:     2.00 cm      LV E/e' lateral: 10.5 LV SV:         56 LV SV Index:   24 LVOT Area:     3.14 cm  LV Volumes (MOD) LV vol d, MOD A4C: 170.0 ml LV vol s, MOD A4C: 50.6 ml LV SV MOD A4C:     170.0 ml RIGHT VENTRICLE             IVC RV Basal diam:  4.20 cm  IVC diam: 2.50 cm RV Mid diam:    2.80 cm RV S prime:     22.40 cm/s TAPSE (M-mode): 2.0 cm LEFT ATRIUM              Index        RIGHT ATRIUM           Index LA diam:        4.20 cm  1.80 cm/m   RA Area:     17.80 cm LA Vol (A2C):   106.0 ml 45.41 ml/m  RA Volume:   43.80 ml  18.76 ml/m LA Vol (A4C):   86.7 ml  37.14 ml/m LA Biplane Vol: 101.0 ml 43.27 ml/m  AORTIC VALVE                     PULMONIC VALVE AV Area (Vmax):    2.04 cm      PV Vmax:       1.42 m/s AV Area (Vmean):   2.04 cm      PV Peak grad:  8.1 mmHg AV Area (VTI):     2.25 cm AV Vmax:           200.00 cm/s AV Vmean:          125.000 cm/s AV VTI:            0.248 m AV Peak Grad:      16.0 mmHg AV Mean Grad:      8.0 mmHg LVOT Vmax:         130.00 cm/s LVOT Vmean:        81.200 cm/s LVOT VTI:          0.178 m LVOT/AV VTI ratio: 0.72  AORTA Ao Root diam: 3.90 cm Ao Asc diam:  3.30 cm MITRAL VALVE                TRICUSPID VALVE MV Area (PHT):  5.70 cm     TR Peak grad:   20.8 mmHg MV Area VTI:   2.31 cm     TR Vmax:        228.00 cm/s MV Peak grad:  5.4 mmHg MV Mean grad:  2.0 mmHg     SHUNTS MV Vmax:       1.16 m/s     Systemic VTI:  0.18 m MV Vmean:      65.4 cm/s    Systemic Diam: 2.00 cm MV Decel Time: 133 msec MV E velocity: 135.00 cm/s Dalton McleanMD Electronically signed by Archer Bear Signature Date/Time: 07/07/2023/9:53:50 AM    Final    DG CHEST PORT 1 VIEW Result Date: 07/06/2023 CLINICAL DATA:  Weakness altered EXAM: PORTABLE CHEST 1 VIEW COMPARISON:  07/05/2023 FINDINGS: Hypoventilatory changes. Cardiomegaly with mild central congestion. No consolidation, pleural effusion, or pneumothorax IMPRESSION: Cardiomegaly with mild central congestion. Electronically Signed   By: Esmeralda Hedge M.D.   On: 07/06/2023 18:17   DG CHEST PORT 1 VIEW Result Date: 07/05/2023 CLINICAL DATA:  Dyspnea EXAM: PORTABLE CHEST 1 VIEW COMPARISON:  07/04/2023 FINDINGS: Hypoventilatory change. Cardiomegaly with mild central congestion. No pleural effusion or pneumothorax. Advanced degenerative change of the shoulders. IMPRESSION: Cardiomegaly with mild central congestion. Electronically Signed   By: Esmeralda Hedge M.D.   On: 07/05/2023 16:06   DG CHEST PORT 1 VIEW Result Date: 07/04/2023 CLINICAL DATA:  Hypertension and dyspnea, generalized weakness, AKI EXAM: PORTABLE CHEST 1 VIEW COMPARISON:  None are available. There is a report from radiographs  12/14/2018 FINDINGS: Borderline cardiomegaly. Diffuse interstitial coarsening and airspace opacities bilaterally. No pleural effusion or pneumothorax. No displaced rib fractures. Advanced arthritis both shoulders. IMPRESSION: Diffuse interstitial coarsening and airspace opacities bilaterally, likely edema. Atypical infection could appear similarly. Electronically Signed   By: Rozell Cornet M.D.   On: 07/04/2023 22:21   CT BONE MARROW BIOPSY & ASPIRATION Result Date: 07/04/2023 INDICATION: [id number] Myeloma  (HCC) [id number] EXAM: CT GUIDED BONE MARROW ASPIRATION AND CORE BIOPSY MEDICATIONS: None. ANESTHESIA/SEDATION: Moderate (conscious) sedation was employed during this procedure. A total of Versed  2 mg and Fentanyl  50 mcg was administered intravenously. Moderate Sedation Time: 16 minutes. The patient's level of consciousness and vital signs were monitored continuously by radiology nursing throughout the procedure under my direct supervision. FLUOROSCOPY TIME:  CT dose; 314 mGycm COMPLICATIONS: None immediate. Estimated blood loss: <5 mL PROCEDURE: RADIATION DOSE REDUCTION: This exam was performed according to the departmental dose-optimization program which includes automated exposure control, adjustment of the mA and/or kV according to patient size and/or use of iterative reconstruction technique. Informed written consent was obtained from the patient and/or patient's representative after a thorough discussion of the procedural risks, benefits and alternatives. All questions were addressed. Maximal Sterile Barrier Technique was utilized including caps, mask, sterile gowns, sterile gloves, sterile drape, hand hygiene and skin antiseptic. A timeout was performed prior to the initiation of the procedure. The patient was positioned prone and non-contrast localization CT was performed of the pelvis to demonstrate the iliac marrow spaces. Under sterile conditions and local anesthesia, an 11 gauge coaxial bone biopsy needle was advanced into the RIGHT iliac marrow space. Needle position was confirmed with CT imaging. Initially, bone marrow aspiration was performed. Next, the 11 gauge outer cannula was utilized to obtain a 1 iliac bone marrow core biopsy. Needle was removed. Hemostasis was obtained with compression. The patient tolerated the procedure well. Samples were prepared with the cytotechnologist. IMPRESSION: Successful CT-guided bone marrow aspiration and biopsy. Art Largo, MD Vascular [facility] Interventional Radiology  [facility] Radiology Electronically Signed   By: Art Largo M.D.   On: 07/04/2023 12:21

## 2023-08-01 NOTE — Progress Notes (Signed)
 CPT treatment held due to dialysis.

## 2023-08-01 NOTE — Progress Notes (Signed)
   08/01/23 1718  Vitals  Temp 98.9 F (37.2 C)  Pulse Rate (!) 107  Resp 16  BP (!) 157/115  SpO2 97 %  O2 Device Tracheostomy Collar  Weight 100.8 kg  Type of Weight Post-Dialysis  Oxygen Therapy  FiO2 (%) 30 %  Patient Activity (if Appropriate) In bed  Pulse Oximetry Type Continuous  Oximetry Probe Site Changed No  Post Treatment  Dialyzer Clearance Lightly streaked  Hemodialysis Intake (mL) 0 mL  Liters Processed 71.7  Fluid Removed (mL) 2500 mL  Tolerated HD Treatment Yes   Tx done at pt bedside--25m10 Responds to voice---trached Informed consent signed and in chart.   TX duration:3.30  Patient tolerated well.  No acute distress Hand-off given to patient's nurse.   Access used: RIJ trialysis Access issues: a visible kink is assessed the the venout port line---Dr. Zana Hesselbach made aware of this to see if need for a change of catheter  Total UF removed: 2500 Medication(s) given: vancomycin  iv x 1   [facility]

## 2023-08-01 NOTE — Progress Notes (Signed)
 Nutrition Follow-up  DOCUMENTATION CODES:  Severe malnutrition in context of acute illness/injury  INTERVENTION:  Continue tube feeding via cortrak: Nepro at 55 ml/h (1320 ml per day) Prosource TF20 60 ml BID Provides 2516 kcal, 147 gm protein, 960 ml free water  daily 1 packet Juven BID, each packet provides 95 calories, 2.5 grams of protein (collagen) + micronutrients to support wound healing Renal MVI Monitor the results of folate, vitamin A, and zinc labs for deficiency  NUTRITION DIAGNOSIS:  Severe Malnutrition related to acute illness (prolonged illness, interuptions in feeding) as evidenced by moderate fat depletion, moderate muscle depletion, percent weight loss (9.6% x 1 month). - remains applicable  GOAL:  Patient will meet greater than or equal to 90% of their needs - progressing  MONITOR:  Vent status, Labs, Weight trends, TF tolerance  REASON FOR ASSESSMENT:  Consult Assessment of nutrition requirement/status  ASSESSMENT:  58 y.o. male with PMH of obesity, asthma, OSA on CPAP, GERD, HTN, HLD who presented due to feeling weak with AMS for a few days. Admitted with concern for sepsis, community-acquired pneumonia, encephalopathy as well as concern for multiple myeloma.  4/17 Admit to [facility] 4/21 SLP eval - NPO; IR attempted Cortrak but unable to be placed; CCM MD successfully placed NGT 4/22 Concern for aspiration overnight; Intubated 4/23 - CRRT 5/8 - tracheostomy 5/10 - CRRT discontinued 5/11 - transferred to [facility] 5/12 - iHD initiated   Pt resting in bed at the time of assessment. On trach collar but still not very responsive. Pt continues to have a lot of stool output >1L yesterday. Medications have been adjusted and lactulose  dose reduced. Will check micronutrient labs in which a deficiency could be contributing to diarrhea. If still having excessive loose stools, will change tube feeding formula as the high fat content on Nepro is likely  contributing.   Pt scheduled to undergo HD this afternoon, potassium low this AM  Temp (24hrs), Avg:99.3 F (37.4 C), Min:97.9 F (36.6 C), Max:101.5 F (38.6 C) MAP (cuff):  Admit weight: 110.6 kg (4/17) Current weight: 102.3 kg (5/16) 9.6% weight loss x ~1 month since admission on 4/17, severe  Intake/Output Summary (Last 24 hours) at 08/01/2023 1433 Last data filed at 08/01/2023 1030 Gross per 24 hour  Intake 1205.75 ml  Output 1200 ml  Net 5.75 ml  Net IO Since Admission: 11,621.01 mL [08/01/23 1433] UOP x 24 hours  Drains/Lines: Small bore tube, 10 Fr. Temporary HD catheter, triple lumen right IJ FMS x 24 hours Tracheostomy   Nutritionally Relevant Medications: Scheduled Meds:  famotidine   10.4 mg Per Tube Daily   PROSource TF20  60 mL Per Tube BID   folic acid   1 mg Per Tube Daily   insulin  aspart  0-20 Units Subcutaneous Q4H   insulin  aspart  4 Units Subcutaneous Q4H   insulin  glargine-yfgn  8 Units Subcutaneous BID   lactulose   10 g Per Tube BID   multivitamin  1 tablet Per Tube QHS   JUVEN  1 packet Per Tube BID BM   thiamine   100 mg Per Tube Daily   Continuous Infusions:  NEPRO CARB STEADY 55 mL/hr at 08/01/23 0900   vancomycin      voriconazole  350 mg (08/01/23 1144)   PRN Meds: docusate, ondansetron , polyethylene glycol  Labs Reviewed: Potassium 3.2 BUN 54, creatinine 3.03 CBG ranges from 95-108 mg/dL over the last 24 hours HgbA1c 6.7% (01/2023)  NUTRITION - FOCUSED PHYSICAL EXAM: Flowsheet Row Most Recent Value  Orbital Region Moderate depletion  Upper Arm Region No depletion  Thoracic and Lumbar Region No depletion  Buccal Region Moderate depletion  Temple Region Moderate depletion  Clavicle Bone Region No depletion  Clavicle and Acromion Bone Region Mild depletion  Scapular Bone Region Mild depletion  Dorsal Hand Moderate depletion  Patellar Region Moderate depletion  Anterior Thigh Region Moderate depletion   Posterior Calf Region Moderate depletion  Edema (RD Assessment) None  Hair Reviewed  Eyes Unable to assess  Mouth Unable to assess  Skin Reviewed  Nails Reviewed   Diet Order:   Diet Order             Diet NPO time specified  Diet effective midnight                  EDUCATION NEEDS:  Not appropriate for education at this time  Skin: Skin Integrity Issues: Unstageable:  - Left and Right Heel - mid Buttocks (14 x 10 x 0.1 cm )  Last BM:  5/16 - type 7 FMS placed on 4/20  Height:  Ht Readings from Last 1 Encounters:  07/22/23 6' 0.01" (1.829 m)   Weight:  Wt Readings from Last 1 Encounters:  08/01/23 102.3 kg    Ideal Body Weight:  80.9 kg  BMI:  Body mass index is 30.58 kg/m.  Estimated Nutritional Needs:  Kcal:  2400-2700 kcal/d Protein:  130-150g/d Fluid:  1L+UOP    Edwena Graham, RD, LDN Registered Dietitian II Please reach out via secure chat

## 2023-08-01 NOTE — TOC Progression Note (Signed)
 Transition of Care (TOC) - Progression Note    Patient Details  Name: Frank [name]. MRN: [id number] Date of Birth: [birth year]  Transition of Care [facility]) CM/SW Contact  Alisa App, RN Phone Number: 08/01/2023, 1:30 PM  Clinical Narrative:    NCM made aware by MD pt  is LTAC appropriate. NCM called and spoke with pt's wife regarding pt transitioning to a LTAC facility, wife agreeable. Wife's preference: [facility]. NCM made referral with [facility] [phone]). [facility] to f/u with pt's wife. TOC team will continue monitoring and assist with needs...   Expected Discharge Plan: Long Term Acute Care (LTAC) Barriers to Discharge: Continued Medical Work up  Expected Discharge Plan and Services In-house Referral: Clinical Social Work Discharge Planning Services: CM Consult Post Acute Care Choice: Skilled Nursing Facility Living arrangements for the past 2 months: Single Family Home                 DME Arranged: N/A DME Agency: NA       HH Arranged: NA HH Agency: NA         Social Determinants of Health (SDOH) Interventions SDOH Screenings   Food Insecurity: No Food Insecurity (07/05/2023)  Housing: Low Risk  (07/05/2023)  Transportation Needs: No Transportation Needs (07/05/2023)  Utilities: Not At Risk (07/05/2023)  Tobacco Use: High Risk (07/10/2023)    Readmission Risk Interventions     No data to display

## 2023-08-01 NOTE — Progress Notes (Signed)
 [facility] NEPHROLOGY PROGRESS NOTE   Subjective:  Seen in ICU I/O yest +80 cc   Objective Vital signs in last 24 hours: Vitals:   07/26/23 1100 07/26/23 1125 07/26/23 1130 07/26/23 1200  BP: (!) 179/96 (!) 146/85 115/72 (!) 83/50  Pulse: (!) 105 96 93 76  Resp: 16 20 20 20   Temp:    99.8 F (37.7 C)  TempSrc:    Axillary  SpO2: 100% 98% 98% 99%  Weight:      Height:        Physical Exam: General: Critically ill looking male, intubated/ trach'd Heart:RRR, s1s2 nl Lungs: clear ant/ lat Abdomen:soft, non-tender, non-distended Extremities: no pitting edema, LUE distortion is broken bone not edema Neurology: responding minimally, on trach collar    Assessment/ Plan: Pt is a 58 y.o. yo male with past medical history significant for HTN, HLD, type II DM, BPH, seizure who was initially presented at [facility] due to AMS, admitted for sepsis due to pneumonia, encephalopathy, sepsis, AKI, hypercalcemia, symptomatic anemia and lytic lesion concern for multiple myeloma.  Problems # AKI on CKD 3a - b/l creat 1.1- 1.5 from 2024, eGFR 53- >60 ml/min. Creat here 5.8 on presentation at OSH. AKI due to myeloma kidney +/- hyperCa +/- ACEi. Started CRRT on 4/23. Pt remains anuric, and tolerating CRRT.  Hemodynamics improved and CRRT dc'd 5/10. Is on iHD now. Doing MWF schedule here for now. HD today.    # AMS/acute metabolic encephalopathy: per primary, seems to be protracted.  MRI, LP completed and unrevealing.   #Volume - appearing euvolemic now, wt's are stable. IV drips are minimal w/ fentanyl  at 20 cc/ hr and TF's at 50 cc/hr. Will UF 2-2.5 L w/ HD   # Hypotension: on midodrine  10 tid now.   # Multiple myeloma: started on Velcade  and Cytoxan , 1st course was 4/22 and 2nd course on 4/25. Labs showed good response, last checked 07/22/23. On hold for now given ongoing critical illness.  #AHRF: s/p trach 5/8, off the ventilator, per CCM  # Atrial fib-  on  amio  #Anemia - prn transfusions. On epogen  per onc.     Larry Poag  MD  [facility] 07/26/2023, 1:31 PM  Recent Labs  Lab 07/24/23 0421 07/24/23 1630 07/25/23 1508 07/26/23 0423  HGB 8.9*  --   --  8.9*  ALBUMIN  1.6*   < > 1.6* 1.8*  1.6*  CALCIUM  7.4*   < > 7.7* 7.7*  7.7*  PHOS 2.0*  1.5*   < > RESULTS UNAVAILABLE DUE TO INTERFERING SUBSTANCE 1.9*  1.5*  CREATININE 1.59*   < > 1.62* 1.77*  1.74*  K 4.3   < > 4.6 4.4  4.5   < > = values in this interval not displayed.    Inpatient medications:  amiodarone   200 mg Per NG tube Daily   arformoterol   15 mcg Nebulization BID   busPIRone   15 mg Per Tube QHS   clonazePAM   1 mg Per Tube BID   docusate  100 mg Per Tube BID   epoetin  alfa  40,000 Units Subcutaneous QODAY   famotidine   10.4 mg Per Tube Daily   feeding supplement (PROSource TF20)  60 mL Per Tube TID   folic acid   1 mg Per Tube Daily   gabapentin   100 mg Per Tube Q12H   heparin  injection (subcutaneous)  5,000 Units Subcutaneous Q8H   insulin  aspart  0-20 Units Subcutaneous Q4H   insulin  aspart  8 Units Subcutaneous Q4H   insulin  glargine-yfgn  10 Units Subcutaneous BID   lactulose   20 g Per Tube TID   leptospermum manuka honey  1 Application Topical Daily   lidocaine -EPINEPHrine   20 mL Intradermal Once   midodrine   10 mg Per Tube TID WC   nicotine   7 mg Transdermal Daily   mouth rinse  15 mL Mouth Rinse Q2H   PARoxetine   40 mg Per Tube Daily   pneumococcal 20-valent conjugate vaccine  0.5 mL Intramuscular Tomorrow-1000   polyethylene glycol  17 g Per Tube Daily   QUEtiapine   50 mg Per Tube QHS   revefenacin   175 mcg Nebulization Daily   sodium chloride  flush  10-40 mL Intracatheter Q12H   sodium chloride  HYPERTONIC  4 mL Nebulization Daily   thiamine   100 mg Per Tube Daily    ampicillin-sulbactam (UNASYN) IV Stopped (07/26/23 7253)   dexmedetomidine  (PRECEDEX ) IV infusion 1.2 mcg/kg/hr (07/26/23 1300)   feeding supplement (VITAL 1.5 CAL) 60 mL/hr at  07/26/23 1300   fentaNYL  infusion INTRAVENOUS 200 mcg/hr (07/26/23 1300)   levofloxacin  (LEVAQUIN ) IV 100 mL/hr at 07/26/23 1300   midazolam      norepinephrine  (LEVOPHED ) Adult infusion 2 mcg/min (07/26/23 1300)   sodium PHOSPHATE  IVPB (in mmol)     voriconazole  Stopped (07/26/23 1225)   Followed by   [name] ON 07/27/2023] voriconazole      acetaminophen  (TYLENOL ) oral liquid 160 mg/5 mL **OR** acetaminophen , fentaNYL , ipratropium-albuterol , midazolam , ondansetron  (ZOFRAN ) IV, mouth rinse, oxyCODONE , polyethylene glycol, polyvinyl alcohol , sodium chloride  flush

## 2023-08-01 NOTE — Progress Notes (Signed)
 [facility]    Date of Admission:  07/03/2023   Total days of antibiotics 7 voriconazole           ID: Frank [name]. is a 58 y.o. 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 Principal Problem:   Generalized weakness Active Problems:   Multiple myeloma not having achieved remission (HCC)   Atrial fibrillation with rapid ventricular response (HCC)   AKI (acute kidney injury) (HCC)   Acute respiratory failure with hypoxia (HCC)   PAF (paroxysmal atrial fibrillation) (HCC)   Acute encephalopathy   Severe sepsis with septic shock (HCC)   On mechanically assisted ventilation (HCC)   Acute metabolic encephalopathy   Lobar pneumonia, unspecified organism (HCC)   Multiple myeloma without remission (HCC)   Protein-calorie malnutrition, severe    Subjective: More alert, but still having fevers, curve slightly improved.  Medications:   amiodarone   200 mg Per NG tube Daily   arformoterol   15 mcg Nebulization BID   busPIRone   15 mg Per Tube QHS   clonazePAM   1 mg Per Tube BID   famotidine   10.4 mg Per Tube Daily   feeding supplement (PROSource TF20)  60 mL Per Tube BID   folic acid   1 mg Per Tube Daily   gabapentin   200 mg Per Tube TID   heparin   2,500 Units Dialysis Once in dialysis   [START ON 08/02/2023] heparin   2,500 Units Dialysis Once in dialysis   heparin  injection (subcutaneous)  5,000 Units Subcutaneous Q8H   insulin  aspart  0-20 Units Subcutaneous Q4H   insulin  aspart  4 Units Subcutaneous Q4H   insulin  glargine-yfgn  8 Units Subcutaneous BID   lactulose   10 g Per Tube BID   leptospermum manuka honey  1 Application Topical Daily   lidocaine -EPINEPHrine   20 mL Intradermal Once   multivitamin  1 tablet Per Tube QHS   nicotine   7 mg Transdermal Daily   nutrition supplement (JUVEN)  1 packet Per Tube BID BM   mouth rinse  15 mL Mouth Rinse Q2H   oxyCODONE   10 mg Per Tube Q6H    PARoxetine   40 mg Per Tube Daily   pneumococcal 20-valent conjugate vaccine  0.5 mL Intramuscular Tomorrow-1000   QUEtiapine   50 mg Per Tube QHS   revefenacin   175 mcg Nebulization Daily   sodium chloride  flush  10-40 mL Intracatheter Q12H   thiamine   100 mg Per Tube Daily    Objective: Vital signs in last 24 hours: Temp:  [97.9 F (36.6 C)-100.6 F (38.1 C)] 100.6 F (38.1 C) (05/16 1733) Pulse Rate:  [74-112] 109 (05/16 1800) Resp:  [11-21] 19 (05/16 1800) BP: (96-159)/(59-118) 144/110 (05/16 1800) SpO2:  [93 %-100 %] 98 % (05/16 1800) FiO2 (%):  [30 %-50 %] 30 % (05/16 1718) Weight:  [100.8 kg-102.3 kg] 100.8 kg (05/16 1718)  Physical Exam  Constitutional: eyes opens to verbal stimuli. He appears well-developed and well-nourished. No distress.  HENT:  Mouth/Throat: Oropharynx is clear and moist. No oropharyngeal exudate. S/p trach Cardiovascular: Normal rate, regular rhythm and normal heart sounds. Exam reveals no gallop and no friction rub.  No murmur heard.  Pulmonary/Chest: Effort normal and breath sounds normal. No respiratory distress. He has no wheezes.  Abdominal: Soft. Bowel sounds are normal. He exhibits no distension. There is no tenderness.  Skin = no rash    Lab Results Recent Labs  07/31/23 0509 08/01/23 0516 08/01/23 1835  WBC 16.8* 17.3*  --   HGB 9.3* 9.0* 10.4*  HCT 29.8* 30.0* 33.5*  NA 136 139  --   K 3.6 3.2*  --   CL 99 101  --   CO2 26 25  --   BUN 70* 109*  --   CREATININE 3.87* 4.93*  --    Liver Panel Recent Labs    07/31/23 0509 08/01/23 0516  PROT 8.5* 8.0  ALBUMIN  1.9* 1.8*  AST 16 21  ALT 10 11  ALKPHOS 90 79  BILITOT 0.6 0.8   Sedimentation Rate No results for input(s): "ESRSEDRATE" in the last 72 hours. C-Reactive Protein Recent Labs    08/01/23 1206  CRP 1.5*    Microbiology: 5/4 rare mold on trach aspirate Studies/Results: No results found.   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 Will see back on monday  [facility] Pager: [phone]  08/01/2023, 7:22 PM

## 2023-08-01 NOTE — Progress Notes (Signed)
 Patient still doing well off vent. Was able to decrease trach collar from 50% FIO2 to 40%.

## 2023-08-01 NOTE — Progress Notes (Signed)
 NAME:  Frank [name]., MRN:  [id number], DOB:  13-Jan-1966, LOS: 29 ADMISSION DATE:  07/03/2023, CONSULTATION DATE:  07/06/23 REFERRING MD:  Dr Maury Space, CHIEF COMPLAINT:  Encephalopathy   History of Present Illness:   PCCM asked to see patient for encephalopathy   Transferred from [facility] with 1 week of generalized weakness, altered mentation, decreased appetite Recently being worked up for multiple myeloma.  Transferred to [facility] for oncology evaluation Nephrology consulted for AKI   Background history of obesity, asthma, obstructive sleep apnea on CPAP GERD, hypertension, hyperlipidemia Has been feeling weak for the last few days according to spouse Admitted with concern for sepsis, community-acquired pneumonia, antibiotic encephalopathy Workup revealed lytic bony lesions with concern for multiple myeloma Transfused with 3 units packed red blood cells Evaluation so far-bone marrow 07/04/2023  Pertinent  Medical History   Past Medical History:  Diagnosis Date   Anxiety    Arthritis    Asthma    Bipolar disorder (HCC)    Current every day smoker    Depression    Diabetes mellitus, type II (HCC)    Dyspnea    History of kidney stones    History of pneumonia    Hyperlipidemia    Hypertension    Morbid obesity (HCC)    Sedentary lifestyle    Seizures (HCC)    last seizure 12 yrs ago, no current problem   Sleep apnea    uses CPAP nightly   Significant Hospital Events: Including procedures, antibiotic start and stop dates in addition to other pertinent events   4/19-blood cultures, MRSA PCR negative 4/19 chest x-ray with mild pulmonary congestion 4/20 atrial fibrillation with RVR 4/21 nosebleed after IR NG attempt, NG placed by PCCM in afternoon, stable transferred to [facility] 4/22 intubated; some abla requiring prbcs; ct abd/pelvis showing acute groin hematoma tracking from posterior presumed from site of bone marrow biopsy; heparin  held EEG 4/25 >> moderate to severe  encephalopathy without any seizures or epileptiform discharges 4/23 respiratory culture >> MRSE 4/28 remains intubated on CRRT, down to one pressor  4/29 labile BP, NE back up. Responded a bit to fluids. Received Velcade  and Cytoxan  chemotherapy 5/1 remains encephalopathic, check CTH 5/2 LP ETT exchange  5/5 1 PRBC. Palliative care met w family  5/6 IVIG. plan for trach established. Added midodrine  after failed vaso wean. Abx/fungal changed to levaquin   5/7 weaning pressors incr midodrine  incr lactulose . Vaso off  5/8 Trach placed. Some post op bleeding improved with thrombi-pad 5/10 voriconazole  added back based on CT Chest findings, MRI brain with no acute changes 5/13 more alert and able to follow some commands today  Interim History / Subjective:   Tmax 102.3 Issue with oversedation last evening -Meds on hold -Tolerating trach collar  Objective   Blood pressure (!) 150/85, pulse 99, temperature 99.3 F (37.4 C), temperature source Axillary, resp. rate 12, height 6' 0.01" (1.829 m), weight 102.3 kg, SpO2 98%.    FiO2 (%):  [40 %-50 %] 40 %   Intake/Output Summary (Last 24 hours) at 08/01/2023 0834 Last data filed at 08/01/2023 0700 Gross per 24 hour  Intake 1452.12 ml  Output 1200 ml  Net 252.12 ml   Filed Weights   07/29/23 0500 07/30/23 0827 07/31/23 0547  Weight: 100 kg 100 kg 102.3 kg   Physical Exam: General: Chronically ill-appearing, moist oral mucosa HEENT: Tracheostomy tube in place, site looks fair Neuro: Moving all extremities, intermittently following commands this morning CV: S1-S2 appreciated PULM: Coarse  breath sounds at the bases GI: Bowel sounds appreciated Extremities: Trace bilateral lower extremity edema  __________________________________________________________  Micro: 4/28 BCx > no growth, final 4/30 respiratory culture-few PMN, rare budding yeast, abundant GPC> few aspergillus fumigatus Quantiferon 5/2 > neg Trach asp 5/4> rare GPC, rare  mold  PCP smear trach asp 5/5 > neg  Trach asp 5/9 > abundant staph hemolyticus, few budding yeast  CSF studies 1 RBC, 1 WBC, glucose 106 CSF culture-2 organisms acute, rare WBC> NGTD Cryptococcal antigen negative meningitis and encephalitis panel negative VDRL  non-reactive   AFB CSF 5/2 >>>>  BUN 90, creatinine 4.11 Albumin  less than 1.5  Reviewed CT scan of the chest 07/26/2023 Nodular densities, groundglass infiltration  Blood cultures 5/11-no growth  Respiratory culture 5/9 Abundant Staphylococcus hemolyticus, few budding yeast seen  Voriconazole  Vancomycin   Reviewed other labs  Resolved Problems  Shock  Assessment & Plan:   Metabolic encephalopathy Seizure disorder Hyperammonemia -On lactulose  -Paxil  and gabapentin  -Oxycodone  - Decrease dose of Klonopin  from 2, 3 times daily to 1mg  twice daily  Multiple myeloma -Received Velcade  and Cytoxan  on 07/08/2023, second dose 07/11/2023 Also did receive high-dose steroids  - His chemotherapy remains on hold at present  Aspergillus pneumonia - On voriconazole   Tracheal aspirate 07/31/2023 not showing any organisms at present  Acute hypoxemic respiratory failure Bilateral pleural effusions History of obstructive sleep apnea History of tobacco abuse - Is tolerating weaning - Able to tolerate trach collar during the day - Will leave him off vent tonight  Leukocytosis Sepsis - Remains on voriconazole , vancomycin   Acute kidney injury on chronic kidney disease stage IIIa - CRRT was stopped on 5/10 - Now on intermittent dialysis  Paroxysmal atrial fibrillation Now in sinus rhythm - Plan to discontinue amiodarone  5/20 - Consider anticoagulation if he goes back into atrial fibrillation  Type 2 diabetes - SSI  Appreciate palliative care involvement regarding goals of care  GOC: See IPAL from 5/7. Wife now updating goals to include 2-3 rounds of CPR maximum should he arrest.  May need LTAC/skilled nursing  facility once more stable  Best Practice (right click and "Reselect all SmartList Selections" daily)   Diet/type: tubefeeds  DVT prophylaxis prophylactic heparin   Pressure ulcer(s): identified on: 19/Apr/2025 GI prophylaxis: H2B Lines: Dialysis Catheter Foley:  na  Code Status:  full code Last date of multidisciplinary goals of care discussion d/w wife 5/7. See IPAL Updated spouse at bedside 07/30/2023  The patient is critically ill with multiple organ systems failure and requires high complexity decision making for assessment and support, frequent evaluation and titration of therapies, application of advanced monitoring technologies and extensive interpretation of multiple databases. Critical Care Time devoted to patient care services described in this note independent of APP/resident time (if applicable)  is 30 minutes.   Myer Artis MD [facility] Personal pager: See Amion If unanswered, please page CCM On-call: #[phone]

## 2023-08-01 NOTE — TOC Progression Note (Signed)
 Transition of Care (TOC) - Progression Note    Patient Details  Name: Frank [name]. MRN: [id number] Date of Birth: 1966-01-05  Transition of Care [facility]) CM/SW Contact  Juliane Och, LCSW Phone Number: 08/01/2023, 12:43 PM  Clinical Narrative:     12:43 PM Per Nephrology, patient will need long-term HD. Per CCM, patient will need long-term trach. CSW consulted RNCM for LTACH.  Expected Discharge Plan: Long Term Acute Care (LTAC) Barriers to Discharge: Continued Medical Work up  Expected Discharge Plan and Services In-house Referral: Clinical Social Work Discharge Planning Services: CM Consult Post Acute Care Choice: Skilled Nursing Facility Living arrangements for the past 2 months: Single Family Home                 DME Arranged: N/A DME Agency: NA       HH Arranged: NA HH Agency: NA         Social Determinants of Health (SDOH) Interventions SDOH Screenings   Food Insecurity: No Food Insecurity (07/05/2023)  Housing: Low Risk  (07/05/2023)  Transportation Needs: No Transportation Needs (07/05/2023)  Utilities: Not At Risk (07/05/2023)  Tobacco Use: High Risk (07/10/2023)    Readmission Risk Interventions     No data to display

## 2023-08-02 DIAGNOSIS — J9601 Acute respiratory failure with hypoxia: Secondary | ICD-10-CM | POA: Diagnosis not present

## 2023-08-02 DIAGNOSIS — G9341 Metabolic encephalopathy: Secondary | ICD-10-CM | POA: Diagnosis not present

## 2023-08-02 DIAGNOSIS — C9000 Multiple myeloma not having achieved remission: Secondary | ICD-10-CM | POA: Diagnosis not present

## 2023-08-02 DIAGNOSIS — J90 Pleural effusion, not elsewhere classified: Secondary | ICD-10-CM | POA: Diagnosis not present

## 2023-08-02 LAB — GLUCOSE, CAPILLARY
Glucose-Capillary: 120 mg/dL — ABNORMAL HIGH (ref 70–99)
Glucose-Capillary: 127 mg/dL — ABNORMAL HIGH (ref 70–99)
Glucose-Capillary: 145 mg/dL — ABNORMAL HIGH (ref 70–99)
Glucose-Capillary: 130 mg/dL — ABNORMAL HIGH (ref 70–99)
Glucose-Capillary: 108 mg/dL — ABNORMAL HIGH (ref 70–99)
Glucose-Capillary: 157 mg/dL — ABNORMAL HIGH (ref 70–99)

## 2023-08-02 LAB — CBC
HCT: 32.4 % — ABNORMAL LOW (ref 39.0–52.0)
Hemoglobin: 9.8 g/dL — ABNORMAL LOW (ref 13.0–17.0)
MCH: 30.7 pg (ref 26.0–34.0)
MCHC: 30.2 g/dL (ref 30.0–36.0)
MCV: 101.6 fL — ABNORMAL HIGH (ref 80.0–100.0)
Platelets: 325 10*3/uL (ref 150–400)
RBC: 3.19 MIL/uL — ABNORMAL LOW (ref 4.22–5.81)
RDW: 25.2 % — ABNORMAL HIGH (ref 11.5–15.5)
WBC: 15.2 10*3/uL — ABNORMAL HIGH (ref 4.0–10.5)
nRBC: 1.9 % — ABNORMAL HIGH (ref 0.0–0.2)

## 2023-08-02 LAB — BASIC METABOLIC PANEL WITH GFR
Anion gap: 11 (ref 5–15)
BUN: 81 mg/dL — ABNORMAL HIGH (ref 6–20)
CO2: 27 mmol/L (ref 22–32)
Calcium: 8.8 mg/dL — ABNORMAL LOW (ref 8.9–10.3)
Chloride: 96 mmol/L — ABNORMAL LOW (ref 98–111)
Creatinine, Ser: 4.13 mg/dL — ABNORMAL HIGH (ref 0.61–1.24)
GFR, Estimated: 16 mL/min — ABNORMAL LOW (ref >60)
Glucose, Bld: 123 mg/dL — ABNORMAL HIGH (ref 70–99)
Potassium: 3.9 mmol/L (ref 3.5–5.1)
Sodium: 134 mmol/L — ABNORMAL LOW (ref 135–145)

## 2023-08-02 LAB — CULTURE, RESPIRATORY W GRAM STAIN: Culture: NORMAL

## 2023-08-02 MED ORDER — CLONAZEPAM 0.5 MG PO TABS
0.5000 mg | ORAL_TABLET | Freq: Every day | ORAL | Status: DC | PRN
  Administered 2023-08-10: 0.5 mg via ORAL
  Filled 2023-08-02 (×2): qty 1

## 2023-08-02 MED ORDER — SODIUM CHLORIDE 0.9 % IV BOLUS
1000.0000 mL | Freq: Once | INTRAVENOUS | Status: AC
  Administered 2023-08-02: 1000 mL via INTRAVENOUS

## 2023-08-02 MED ORDER — CLONAZEPAM 0.5 MG PO TABS
0.5000 mg | ORAL_TABLET | Freq: Two times a day (BID) | ORAL | Status: AC
Start: 2023-08-02 — End: 2023-08-03
  Administered 2023-08-02 – 2023-08-03 (×2): 0.5 mg
  Filled 2023-08-02 (×2): qty 1

## 2023-08-02 MED ORDER — PANCRELIPASE (LIP-PROT-AMYL) 10440-39150 UNITS PO TABS
20880.0000 [IU] | ORAL_TABLET | Freq: Once | ORAL | Status: AC
  Administered 2023-08-02: 20880 [IU]
  Filled 2023-08-02: qty 2

## 2023-08-02 MED ORDER — CLONAZEPAM 0.5 MG PO TABS
0.5000 mg | ORAL_TABLET | Freq: Every day | ORAL | Status: AC
  Administered 2023-08-04: 0.5 mg
  Filled 2023-08-02: qty 1

## 2023-08-02 MED ORDER — SODIUM BICARBONATE 650 MG PO TABS
650.0000 mg | ORAL_TABLET | Freq: Once | ORAL | Status: AC
  Administered 2023-08-02: 650 mg
  Filled 2023-08-02: qty 1

## 2023-08-02 NOTE — Care Management (Addendum)
 Notified by [facility] liaison that they cannot provide chemo meds. If it is anticipated that the patient will be on chemo or restart chemo during his stay at [facility], they would not be able to offer a bed.  MD confirmed no chemo, and [facility] states they will submit for auth on Monday

## 2023-08-02 NOTE — Plan of Care (Signed)
   Problem: Clinical Measurements: Goal: Will remain free from infection Outcome: Progressing   Problem: Clinical Measurements: Goal: Respiratory complications will improve Outcome: Progressing

## 2023-08-02 NOTE — Progress Notes (Signed)
 [facility] NEPHROLOGY PROGRESS NOTE   Subjective:  Seen in ICU I/O yest net neg 1.9 L  HR up 105, wt's down 96kg   Objective Vital signs in last 24 hours: Vitals:   07/26/23 1100 07/26/23 1125 07/26/23 1130 07/26/23 1200  BP: (!) 179/96 (!) 146/85 115/72 (!) 83/50  Pulse: (!) 105 96 93 76  Resp: 16 20 20 20   Temp:    99.8 F (37.7 C)  TempSrc:    Axillary  SpO2: 100% 98% 98% 99%  Weight:      Height:        Physical Exam: General: Critically ill looking male, intubated/ trach'd Heart:RRR, s1s2 nl Lungs: clear ant/ lat Abdomen:soft, non-tender, non-distended Extremities: no pitting edema, LUE distortion is broken bone not edema Neurology: responding minimally, on trach collar    Assessment/ Plan: Pt is a 58 y.o. yo male with past medical history significant for HTN, HLD, type II DM, BPH, seizure who was initially presented at [facility] due to AMS, admitted for sepsis due to pneumonia, encephalopathy, sepsis, AKI, hypercalcemia, symptomatic anemia and lytic lesion concern for multiple myeloma.  Problems # AKI on CKD 3a - b/l creat 1.1- 1.5 from 2024, eGFR 53- >60 ml/min. Creat here 5.8 on presentation at OSH. AKI due to myeloma kidney +/- hyperCa +/- ACEi. Started CRRT on 4/23. Pt remains anuric, and tolerating CRRT.  Hemodynamics improved and CRRT dc'd 5/10. Is on iHD now. Doing MWF schedule here for now. Next HD monday.    # AMS/acute metabolic encephalopathy: per primary, seems to be protracted.  MRI, LP completed and unrevealing.   #Volume - appearing euvolemic now, wt's are down due to UF w/ HD yest and also GI losses. HR up, will bolus 1 L NS.   # Hypotension: on midodrine  10 tid now.   # Multiple myeloma: started on Velcade  and Cytoxan , 1st course was 4/22 and 2nd course on 4/25. Labs showed good response, last checked 07/22/23. On hold for now given ongoing critical illness.  #AHRF: s/p trach 5/8, off the ventilator, per CCM  # Atrial  fib-  on amio  #Anemia - prn transfusions. On epogen  per onc.     Larry Poag  MD  [facility] 07/26/2023, 1:31 PM  Recent Labs  Lab 07/24/23 0421 07/24/23 1630 07/25/23 1508 07/26/23 0423  HGB 8.9*  --   --  8.9*  ALBUMIN  1.6*   < > 1.6* 1.8*  1.6*  CALCIUM  7.4*   < > 7.7* 7.7*  7.7*  PHOS 2.0*  1.5*   < > RESULTS UNAVAILABLE DUE TO INTERFERING SUBSTANCE 1.9*  1.5*  CREATININE 1.59*   < > 1.62* 1.77*  1.74*  K 4.3   < > 4.6 4.4  4.5   < > = values in this interval not displayed.    Inpatient medications:  amiodarone   200 mg Per NG tube Daily   arformoterol   15 mcg Nebulization BID   busPIRone   15 mg Per Tube QHS   clonazePAM   1 mg Per Tube BID   docusate  100 mg Per Tube BID   epoetin  alfa  40,000 Units Subcutaneous QODAY   famotidine   10.4 mg Per Tube Daily   feeding supplement (PROSource TF20)  60 mL Per Tube TID   folic acid   1 mg Per Tube Daily   gabapentin   100 mg Per Tube Q12H   heparin  injection (subcutaneous)  5,000 Units Subcutaneous Q8H   insulin  aspart  0-20 Units Subcutaneous  Q4H   insulin  aspart  8 Units Subcutaneous Q4H   insulin  glargine-yfgn  10 Units Subcutaneous BID   lactulose   20 g Per Tube TID   leptospermum manuka honey  1 Application Topical Daily   lidocaine -EPINEPHrine   20 mL Intradermal Once   midodrine   10 mg Per Tube TID WC   nicotine   7 mg Transdermal Daily   mouth rinse  15 mL Mouth Rinse Q2H   PARoxetine   40 mg Per Tube Daily   pneumococcal 20-valent conjugate vaccine  0.5 mL Intramuscular Tomorrow-1000   polyethylene glycol  17 g Per Tube Daily   QUEtiapine   50 mg Per Tube QHS   revefenacin   175 mcg Nebulization Daily   sodium chloride  flush  10-40 mL Intracatheter Q12H   sodium chloride  HYPERTONIC  4 mL Nebulization Daily   thiamine   100 mg Per Tube Daily    ampicillin -sulbactam (UNASYN ) IV Stopped (07/26/23 1610)   dexmedetomidine  (PRECEDEX ) IV infusion 1.2 mcg/kg/hr (07/26/23 1300)   feeding supplement (VITAL 1.5 CAL) 60 mL/hr  at 07/26/23 1300   fentaNYL  infusion INTRAVENOUS 200 mcg/hr (07/26/23 1300)   levofloxacin  (LEVAQUIN ) IV 100 mL/hr at 07/26/23 1300   midazolam      norepinephrine  (LEVOPHED ) Adult infusion 2 mcg/min (07/26/23 1300)   sodium PHOSPHATE  IVPB (in mmol)     voriconazole  Stopped (07/26/23 1225)   Followed by   [name] ON 07/27/2023] voriconazole      acetaminophen  (TYLENOL ) oral liquid 160 mg/5 mL **OR** acetaminophen , fentaNYL , ipratropium-albuterol , midazolam , ondansetron  (ZOFRAN ) IV, mouth rinse, oxyCODONE , polyethylene glycol, polyvinyl alcohol , sodium chloride  flush

## 2023-08-02 NOTE — Progress Notes (Signed)
 NAME:  Frank [name]., MRN:  [id number], DOB:  December 21, 1965, LOS: 30 ADMISSION DATE:  07/03/2023, CONSULTATION DATE:  07/06/23 REFERRING MD:  Dr Maury Space, CHIEF COMPLAINT:  Encephalopathy   History of Present Illness:   PCCM asked to see patient for encephalopathy   Transferred from [facility] with 1 week of generalized weakness, altered mentation, decreased appetite Recently being worked up for multiple myeloma.  Transferred to [facility] for oncology evaluation Nephrology consulted for AKI   Background history of obesity, asthma, obstructive sleep apnea on CPAP GERD, hypertension, hyperlipidemia Has been feeling weak for the last few days according to spouse Admitted with concern for sepsis, community-acquired pneumonia, antibiotic encephalopathy Workup revealed lytic bony lesions with concern for multiple myeloma Transfused with 3 units packed red blood cells Evaluation so far-bone marrow 07/04/2023  Pertinent  Medical History   Past Medical History:  Diagnosis Date   Anxiety    Arthritis    Asthma    Bipolar disorder (HCC)    Current every day smoker    Depression    Diabetes mellitus, type II (HCC)    Dyspnea    History of kidney stones    History of pneumonia    Hyperlipidemia    Hypertension    Morbid obesity (HCC)    Sedentary lifestyle    Seizures (HCC)    last seizure 12 yrs ago, no current problem   Sleep apnea    uses CPAP nightly   Significant Hospital Events: Including procedures, antibiotic start and stop dates in addition to other pertinent events   4/19-blood cultures, MRSA PCR negative 4/19 chest x-ray with mild pulmonary congestion 4/20 atrial fibrillation with RVR 4/21 nosebleed after IR NG attempt, NG placed by PCCM in afternoon, stable transferred to [facility] 4/22 intubated; some abla requiring prbcs; ct abd/pelvis showing acute groin hematoma tracking from posterior presumed from site of bone marrow biopsy; heparin  held EEG 4/25 >> moderate to severe  encephalopathy without any seizures or epileptiform discharges 4/23 respiratory culture >> MRSE 4/28 remains intubated on CRRT, down to one pressor  4/29 labile BP, NE back up. Responded a bit to fluids. Received Velcade  and Cytoxan  chemotherapy 5/1 remains encephalopathic, check CTH 5/2 LP ETT exchange  5/5 1 PRBC. Palliative care met w family  5/6 IVIG. plan for trach established. Added midodrine  after failed vaso wean. Abx/fungal changed to levaquin   5/7 weaning pressors incr midodrine  incr lactulose . Vaso off  5/8 Trach placed. Some post op bleeding improved with thrombi-pad 5/10 voriconazole  added back based on CT Chest findings, MRI brain with no acute changes 5/13 more alert and able to follow some commands today  Interim History / Subjective:   Tmax 102.3 Issue with oversedation last evening -Meds on hold -Tolerating trach collar  Objective   Blood pressure 117/77, pulse 89, temperature 99.3 F (37.4 C), temperature source Axillary, resp. rate 14, height 6' 0.01" (1.829 m), weight 96.1 kg, SpO2 94%.    FiO2 (%):  [30 %-40 %] 30 %   Intake/Output Summary (Last 24 hours) at 08/02/2023 0905 Last data filed at 08/02/2023 0800 Gross per 24 hour  Intake 1773.96 ml  Output 3650 ml  Net -1876.04 ml   Filed Weights   08/01/23 1319 08/01/23 1718 08/02/23 0433  Weight: 102.3 kg 100.8 kg 96.1 kg   Gen:      acutely and chronically ill appearing, appears older than stated age HEENT:  ETT to vent Lungs:    sounds of mechanical ventilation auscultated no  wheeze CV:         tachycardic, regular, systolic murmur Abd:      Soft, nontender Ext:    No edema Skin:      Warm and dry; no rashes Neuro:   awake, opens eyes, does not track, RASS -2, moves all 4 extremities   __________________________________________________________  Micro: 4/28 BCx > no growth, final 4/30 respiratory culture-few PMN, rare budding yeast, abundant GPC> few aspergillus fumigatus Quantiferon 5/2 >  neg Trach asp 5/4> rare GPC, rare mold  PCP smear trach asp 5/5 > neg  Trach asp 5/9 > abundant staph hemolyticus, few budding yeast Trach aspirate 5/15 - ngtd Blood cultures 5/11-no growth  CSF studies 1 RBC, 1 WBC, glucose 106 CSF culture-2 organisms acute, rare WBC> NGTD Cryptococcal antigen negative meningitis and encephalitis panel negative VDRL  non-reactive   AFB CSF 5/2 >>>>  Labs reviewed Hgb 5/16 10.4 Am labs pending today  Resolved Problems  Shock  Assessment & Plan:   Metabolic encephalopathy Seizure disorder Hyperammonemia -On lactulose  -Paxil  and gabapentin  -Oxycodone  - tapering clonazepam  to off given ongoing encephalopathy - repeat bmet pending this am  Acute hypoxemic respiratory failure Bilateral pleural effusions History of obstructive sleep apnea History of tobacco abuse Aspergillus pneumonia - Is tolerating weaning with TC trials as tolerated - trach care - prn bronchodilators - On voriconazole  for anticipated prolonged course per ID - volume removal through HD  Acute kidney injury on chronic kidney disease stage IIIa - CRRT was stopped on 5/10 - Now on intermittent dialysis - continue volume removal as tolerated as he remains net positive for his 30+ day admission - repeat bmet pending this am  Multiple myeloma -Received Velcade  and Cytoxan  on 07/08/2023, second dose 07/11/2023 Also did receive high-dose steroids  - His chemotherapy remains on hold at present - probably would not be receiving chemotherapy while on anti-fungal therapy  Paroxysmal atrial fibrillation in the setting of acute illness Now in sinus rhythm - Plan to discontinue amiodarone  5/20 - Consider anticoagulation if he goes back into atrial fibrillation  Type 2 diabetes - SSI  GOC: See IPAL from 5/7. Wife now updating goals to include 2-3 rounds of CPR maximum should he arrest. LTAC disposition pending, referral to select has been made.  Appreciate palliative care  involvement regarding goals of care  Best Practice (right click and "Reselect all SmartList Selections" daily)   Diet/type: tubefeeds  DVT prophylaxis prophylactic heparin   Pressure ulcer(s): identified on: 19/Apr/2025 GI prophylaxis: H2B Lines: Dialysis Catheter Foley:  na  Code Status:  full code Last date of multidisciplinary goals of care discussion d/w wife 5/7. See IPAL Updated spouse at bedside 07/30/2023  The patient is critically ill due to respiratory failure, encephalopathy.  Critical care was necessary to treat or prevent imminent or life-threatening deterioration.  Critical care was time spent personally by me on the following activities: development of treatment plan with patient and/or surrogate as well as nursing, discussions with consultants, evaluation of patient's response to treatment, examination of patient, obtaining history from patient or surrogate, ordering and performing treatments and interventions, ordering and review of laboratory studies, ordering and review of radiographic studies, pulse oximetry, re-evaluation of patient's condition and participation in multidisciplinary rounds.   Critical Care Time devoted to patient care services described in this note is 35 minutes. This time reflects time of care of this signee Lenee Franze S Brenten Koc . This critical care time does not reflect separately billable procedures or procedure time, teaching time or  supervisory time of PA/NP/Med student/Med Resident etc but could involve care discussion time.       Aleck Hurdle [facility] 08/02/2023 9:06 AM  Pager: see AMION  If no response to pager , please call critical care on call (see AMION) until 7pm After 7:00 pm call Elink

## 2023-08-02 NOTE — Progress Notes (Signed)
 Cortrak clogged, despite RN de-clogging protocol. Unable to give tube feeds or enteral meds. Tube feed coverage insulin  held. Continue basal, SSI.  Louie Rover, MD Pulmonary and Critical Care Medicine [facility] 08/02/2023 6:17 PM Pager: see AMION  If no response to pager, please call critical care on call (see AMION) until 7pm After 7:00 pm call Elink

## 2023-08-02 NOTE — Plan of Care (Signed)
  Problem: Nutrition: Goal: Adequate nutrition will be maintained Outcome: Progressing   Problem: Skin Integrity: Goal: Risk for impaired skin integrity will decrease Outcome: Progressing   Problem: Fluid Volume: Goal: Ability to maintain a balanced intake and output will improve Outcome: Progressing

## 2023-08-03 ENCOUNTER — Inpatient Hospital Stay (HOSPITAL_COMMUNITY)

## 2023-08-03 DIAGNOSIS — J9503 Malfunction of tracheostomy stoma: Secondary | ICD-10-CM | POA: Diagnosis not present

## 2023-08-03 DIAGNOSIS — J90 Pleural effusion, not elsewhere classified: Secondary | ICD-10-CM | POA: Diagnosis not present

## 2023-08-03 DIAGNOSIS — J9601 Acute respiratory failure with hypoxia: Secondary | ICD-10-CM | POA: Diagnosis not present

## 2023-08-03 DIAGNOSIS — G9341 Metabolic encephalopathy: Secondary | ICD-10-CM | POA: Diagnosis not present

## 2023-08-03 DIAGNOSIS — C9000 Multiple myeloma not having achieved remission: Secondary | ICD-10-CM | POA: Diagnosis not present

## 2023-08-03 LAB — BASIC METABOLIC PANEL WITH GFR
Anion gap: 13 (ref 5–15)
BUN: 114 mg/dL — ABNORMAL HIGH (ref 6–20)
CO2: 24 mmol/L (ref 22–32)
Calcium: 8.7 mg/dL — ABNORMAL LOW (ref 8.9–10.3)
Chloride: 98 mmol/L (ref 98–111)
Creatinine, Ser: 4.93 mg/dL — ABNORMAL HIGH (ref 0.61–1.24)
GFR, Estimated: 13 mL/min — ABNORMAL LOW (ref >60)
Glucose, Bld: 106 mg/dL — ABNORMAL HIGH (ref 70–99)
Potassium: 3.6 mmol/L (ref 3.5–5.1)
Sodium: 135 mmol/L (ref 135–145)

## 2023-08-03 LAB — GLUCOSE, CAPILLARY
Glucose-Capillary: 106 mg/dL — ABNORMAL HIGH (ref 70–99)
Glucose-Capillary: 118 mg/dL — ABNORMAL HIGH (ref 70–99)
Glucose-Capillary: 126 mg/dL — ABNORMAL HIGH (ref 70–99)
Glucose-Capillary: 167 mg/dL — ABNORMAL HIGH (ref 70–99)
Glucose-Capillary: 170 mg/dL — ABNORMAL HIGH (ref 70–99)
Glucose-Capillary: 176 mg/dL — ABNORMAL HIGH (ref 70–99)

## 2023-08-03 LAB — CULTURE, RESPIRATORY W GRAM STAIN

## 2023-08-03 LAB — CBC
HCT: 29.5 % — ABNORMAL LOW (ref 39.0–52.0)
Hemoglobin: 8.9 g/dL — ABNORMAL LOW (ref 13.0–17.0)
MCH: 30.7 pg (ref 26.0–34.0)
MCHC: 30.2 g/dL (ref 30.0–36.0)
MCV: 101.7 fL — ABNORMAL HIGH (ref 80.0–100.0)
Platelets: 330 10*3/uL (ref 150–400)
RBC: 2.9 MIL/uL — ABNORMAL LOW (ref 4.22–5.81)
RDW: 25.3 % — ABNORMAL HIGH (ref 11.5–15.5)
WBC: 11.1 10*3/uL — ABNORMAL HIGH (ref 4.0–10.5)
nRBC: 1.7 % — ABNORMAL HIGH (ref 0.0–0.2)

## 2023-08-03 LAB — PHOSPHORUS

## 2023-08-03 LAB — MISC LABCORP TEST (SEND OUT): Labcorp test code: 1024

## 2023-08-03 MED ORDER — MIDAZOLAM HCL 2 MG/2ML IJ SOLN: 2.0000 mg | Freq: Once | INTRAMUSCULAR | Status: AC

## 2023-08-03 MED ORDER — FENTANYL CITRATE PF 50 MCG/ML IJ SOSY
PREFILLED_SYRINGE | INTRAMUSCULAR | Status: AC
  Filled 2023-08-03: qty 1

## 2023-08-03 MED ORDER — ETOMIDATE 2 MG/ML IV SOLN
INTRAVENOUS | Status: AC
Start: 2023-08-03 — End: 2023-08-03
  Administered 2023-08-03: 20 mg via INTRAVENOUS
  Filled 2023-08-03: qty 10

## 2023-08-03 MED ORDER — MIDAZOLAM HCL 2 MG/2ML IJ SOLN
INTRAMUSCULAR | Status: AC
  Administered 2023-08-03: 2 mg via INTRAVENOUS
  Filled 2023-08-03: qty 4

## 2023-08-03 MED ORDER — ROCURONIUM BROMIDE 10 MG/ML (PF) SYRINGE: 100.0000 mg | PREFILLED_SYRINGE | Freq: Once | INTRAVENOUS | Status: AC

## 2023-08-03 MED ORDER — ROCURONIUM BROMIDE 10 MG/ML (PF) SYRINGE
PREFILLED_SYRINGE | INTRAVENOUS | Status: AC
  Administered 2023-08-03: 100 mg via INTRAVENOUS
  Filled 2023-08-03: qty 10

## 2023-08-03 MED ORDER — SODIUM CHLORIDE 0.9 % IV SOLN: INTRAVENOUS | Status: DC

## 2023-08-03 MED ORDER — PHENYLEPHRINE 80 MCG/ML (10ML) SYRINGE FOR IV PUSH (FOR BLOOD PRESSURE SUPPORT)
PREFILLED_SYRINGE | INTRAVENOUS | Status: AC
  Filled 2023-08-03: qty 10

## 2023-08-03 MED ORDER — ROCURONIUM BROMIDE 10 MG/ML (PF) SYRINGE
PREFILLED_SYRINGE | INTRAVENOUS | Status: AC
Start: 2023-08-03 — End: 2023-08-03
  Administered 2023-08-03: 100 mg via INTRAVENOUS
  Filled 2023-08-03: qty 10

## 2023-08-03 MED ORDER — MIDAZOLAM HCL 2 MG/2ML IJ SOLN
2.0000 mg | Freq: Once | INTRAMUSCULAR | Status: AC
  Administered 2023-08-03: 2 mg via INTRAVENOUS

## 2023-08-03 MED ORDER — ETOMIDATE 2 MG/ML IV SOLN: 20.0000 mg | Freq: Once | INTRAVENOUS | Status: AC

## 2023-08-03 MED ORDER — SODIUM CHLORIDE 0.9 % IV BOLUS: 1000.0000 mL | Freq: Once | INTRAVENOUS | Status: DC

## 2023-08-03 MED ORDER — MIDAZOLAM HCL 2 MG/2ML IJ SOLN
INTRAMUSCULAR | Status: AC
  Filled 2023-08-03: qty 2

## 2023-08-03 MED ORDER — FENTANYL CITRATE PF 50 MCG/ML IJ SOSY
100.0000 ug | PREFILLED_SYRINGE | Freq: Once | INTRAMUSCULAR | Status: AC
  Filled 2023-08-03: qty 2

## 2023-08-03 MED ORDER — FENTANYL CITRATE PF 50 MCG/ML IJ SOSY
PREFILLED_SYRINGE | INTRAMUSCULAR | Status: AC
  Administered 2023-08-03: 100 ug via INTRAVENOUS
  Filled 2023-08-03: qty 2

## 2023-08-03 MED ORDER — MIDAZOLAM HCL 2 MG/2ML IJ SOLN
INTRAMUSCULAR | Status: AC
  Administered 2023-08-03: 2 mg via INTRAVENOUS
  Filled 2023-08-03: qty 6

## 2023-08-03 MED ORDER — FENTANYL CITRATE PF 50 MCG/ML IJ SOSY
50.0000 ug | PREFILLED_SYRINGE | Freq: Once | INTRAMUSCULAR | Status: AC
  Administered 2023-08-03: 50 ug via INTRAVENOUS

## 2023-08-03 NOTE — Progress Notes (Signed)
 Called to evaluate patient for hypoxemia, inability to bag through trach, unable to pass suction catheter and lack of color change on etco2. On my evaluation tracheostomy tube was out and I was unable to feed blind. Bronchoscopy at bedside confirmed redundant tissue impeding passage of trach tube and it was in a false tract. I called Dr. Zaida Hertz to bedside and he confirmed. Decision made to intubate from above and proceed with emergent tracheostomy revision. See separate procedure notes.   My cc time irrespective of procedures  30 minutes  Louie Rover, MD Pulmonary and Critical Care Medicine [facility] 08/03/2023 2:11 PM Pager: see AMION  If no response to pager, please call critical care on call (see AMION) until 7pm After 7:00 pm call Elink

## 2023-08-03 NOTE — Procedures (Signed)
 Percutaneous Tracheostomy Procedure Note   Frank [name]  [id number]  Oct 24, 1965  Date:08/03/23  Time:2:15 PM   Provider Performing:Druscilla Petsch  Procedure: Percutaneous Tracheostomy with Bronchoscopic Guidance (11914)  Indication(s) Acute respiratory failure  Consent Risks of the procedure as well as the alternatives and risks of each were explained to the patient and/or caregiver.  Consent for the procedure was obtained.  Anesthesia Etomidate , Versed , Fentanyl , Rocuronium    Time Out Verified patient identification, verified procedure, site/side was marked, verified correct patient position, special equipment/implants available, medications/allergies/relevant history reviewed, required imaging and test results available.   Sterile Technique Maximal sterile technique including sterile barrier drape, hand hygiene, sterile gown, sterile gloves, mask, hair covering.    Procedure Description Appropriate anatomy identified by palpation.  Patient's neck prepped and draped in sterile fashion.  1% lidocaine  with epinephrine  was used to anesthetize skin overlying neck.  1.5cm incision made and blunt dissection performed until tracheal rings could be easily palpated.   Then a size 6 Distal XLT Shiley tracheostomy was placed under bronchoscopic visualization using usual Seldinger technique and serial dilation.   Bronchoscope confirmed placement above the carina.  Tracheostomy was sutured in place with adhesive pad to protect skin under pressure.    Patient connected to ventilator.   Complications/Tolerance None; patient tolerated the procedure well. Chest X-ray is ordered to confirm no post-procedural complication.   EBL Minimal   Specimen(s) None

## 2023-08-03 NOTE — Progress Notes (Addendum)
 [facility] NEPHROLOGY PROGRESS NOTE   Subjective:  Seen in ICU I/O yest net neg 1.9 L  HR up 105, wt's down 96kg   Objective Vital signs in last 24 hours: Vitals:   07/26/23 1100 07/26/23 1125 07/26/23 1130 07/26/23 1200  BP: (!) 179/96 (!) 146/85 115/72 (!) 83/50  Pulse: (!) 105 96 93 76  Resp: 16 20 20 20   Temp:    99.8 F (37.7 C)  TempSrc:    Axillary  SpO2: 100% 98% 98% 99%  Weight:      Height:        Physical Exam: General: Critically ill looking male, intubated/ trach'd Heart:RRR, s1s2 nl Lungs: clear ant/ lat Abdomen:soft, non-tender, non-distended Extremities: no pitting edema, LUE distortion is broken bone not edema Neurology: responding minimally, on trach collar    Assessment/ Plan: Pt is a 58 y.o. yo male with past medical history significant for HTN, HLD, type II DM, BPH, seizure who was initially presented at [facility] due to AMS, admitted for sepsis due to pneumonia, encephalopathy, sepsis, AKI, hypercalcemia, symptomatic anemia and lytic lesion concern for multiple myeloma.  Problems # AKI on CKD 3a - b/l creat 1.1- 1.5 from 2024, eGFR 53- >60 ml/min. Creat here 5.8 on presentation at OSH. AKI due to myeloma kidney +/- hyperCa +/- ACEi. Started CRRT on 4/23. Pt remains anuric, and tolerating CRRT.  Hemodynamics improved and CRRT dc'd 5/10. Is on iHD now. Doing MWF schedule here for now. Next HD monday.    # AMS/acute metabolic encephalopathy: MRI, LP completed and unrevealing. Improved some recently - will blink when asked, tries to grip.     #Volume - appears euvolemic. Wt's dropped this week a bit and GI losses are 0.5- 1.5 L per day. Gave 1 bolus yesterday due to [facility]. BP's down today 90s --> will start NS 0.9% at 75 cc/hr and keep even w/ HD tomorrow.   # Hypotension: on midodrine  10 tid now.   # Multiple myeloma: started on Velcade  and Cytoxan , #1 4/22 and #2 4/25. Labs showed good response, last checked 07/22/23. On hold  for now given ongoing critical illness.  #AHRF/ aspergillus PNA/ s/p trach 5/8: on voriconazole  IV, off all other IV antibiotics. Off the ventilator on trach collar, per CCM  # Atrial fib-  on amio  #Anemia - prn transfusions. On epogen  per onc.     Larry Poag  MD  [facility] 07/26/2023, 1:31 PM  Recent Labs  Lab 07/24/23 0421 07/24/23 1630 07/25/23 1508 07/26/23 0423  HGB 8.9*  --   --  8.9*  ALBUMIN  1.6*   < > 1.6* 1.8*  1.6*  CALCIUM  7.4*   < > 7.7* 7.7*  7.7*  PHOS 2.0*  1.5*   < > RESULTS UNAVAILABLE DUE TO INTERFERING SUBSTANCE 1.9*  1.5*  CREATININE 1.59*   < > 1.62* 1.77*  1.74*  K 4.3   < > 4.6 4.4  4.5   < > = values in this interval not displayed.    Inpatient medications:  amiodarone   200 mg Per NG tube Daily   arformoterol   15 mcg Nebulization BID   busPIRone   15 mg Per Tube QHS   clonazePAM   1 mg Per Tube BID   docusate  100 mg Per Tube BID   epoetin  alfa  40,000 Units Subcutaneous QODAY   famotidine   10.4 mg Per Tube Daily   feeding supplement (PROSource TF20)  60 mL Per Tube TID   folic  acid  1 mg Per Tube Daily   gabapentin   100 mg Per Tube Q12H   heparin  injection (subcutaneous)  5,000 Units Subcutaneous Q8H   insulin  aspart  0-20 Units Subcutaneous Q4H   insulin  aspart  8 Units Subcutaneous Q4H   insulin  glargine-yfgn  10 Units Subcutaneous BID   lactulose   20 g Per Tube TID   leptospermum manuka honey  1 Application Topical Daily   lidocaine -EPINEPHrine   20 mL Intradermal Once   midodrine   10 mg Per Tube TID WC   nicotine   7 mg Transdermal Daily   mouth rinse  15 mL Mouth Rinse Q2H   PARoxetine   40 mg Per Tube Daily   pneumococcal 20-valent conjugate vaccine  0.5 mL Intramuscular Tomorrow-1000   polyethylene glycol  17 g Per Tube Daily   QUEtiapine   50 mg Per Tube QHS   revefenacin   175 mcg Nebulization Daily   sodium chloride  flush  10-40 mL Intracatheter Q12H   sodium chloride  HYPERTONIC  4 mL Nebulization Daily   thiamine   100 mg Per Tube  Daily    ampicillin -sulbactam (UNASYN ) IV Stopped (07/26/23 1610)   dexmedetomidine  (PRECEDEX ) IV infusion 1.2 mcg/kg/hr (07/26/23 1300)   feeding supplement (VITAL 1.5 CAL) 60 mL/hr at 07/26/23 1300   fentaNYL  infusion INTRAVENOUS 200 mcg/hr (07/26/23 1300)   levofloxacin  (LEVAQUIN ) IV 100 mL/hr at 07/26/23 1300   midazolam      norepinephrine  (LEVOPHED ) Adult infusion 2 mcg/min (07/26/23 1300)   sodium PHOSPHATE  IVPB (in mmol)     voriconazole  Stopped (07/26/23 1225)   Followed by   [name] ON 07/27/2023] voriconazole      acetaminophen  (TYLENOL ) oral liquid 160 mg/5 mL **OR** acetaminophen , fentaNYL , ipratropium-albuterol , midazolam , ondansetron  (ZOFRAN ) IV, mouth rinse, oxyCODONE , polyethylene glycol, polyvinyl alcohol , sodium chloride  flush

## 2023-08-03 NOTE — Procedures (Signed)
 Cross covering ICU physician.   Called to assess fresh trach from today. L suture was no longer intact. Under sterile precautions a new suture was placed to trach to ensure securement. No complications appreciated. Pt tolerated well. One time sedation and fentanyl  was ordered for comfort re: this procedure. BP stable. See MAR for details.   RN at bedside throughout.

## 2023-08-03 NOTE — Procedures (Signed)
 Diagnostic Bronchoscopy  Frank [name]  [id number]  09/04/1965  Date:08/03/23  Time:2:13 PM   Provider Performing:Talaysia Pinheiro [name]   Procedure: Diagnostic Bronchoscopy (40981)  Indication(s) Assist with direct visualization of tracheostomy placement  Consent Emergent due to loss of airway.    Anesthesia See separate tracheostomy note   Time Out Verified patient identification, verified procedure, site/side was marked, verified correct patient position, special equipment/implants available, medications/allergies/relevant history reviewed, required imaging and test results available.   Sterile Technique Usual hand hygiene, masks, gowns, and gloves were used   Procedure Description Bronchoscope advanced through endotracheal tube and into airway.  After suctioning out tracheal secretions, bronchoscope used to provide direct visualization of tracheostomy placement.   Complications/Tolerance None; patient tolerated the procedure well.   EBL None  Specimen(s) None

## 2023-08-03 NOTE — Procedures (Signed)
 Intubation Procedure Note  Frank [name]  [id number]  08-29-1965  Date:08/03/23  Time:2:12 PM   Provider Performing:Tracye Szuch [name]    Procedure: Intubation (31500)  Indication(s) Respiratory Failure  Consent Unable to obtain consent due to emergent nature of procedure.   Anesthesia Etomidate , Fentanyl , and Rocuronium    Time Out Verified patient identification, verified procedure, site/side was marked, verified correct patient position, special equipment/implants available, medications/allergies/relevant history reviewed, required imaging and test results available.   Sterile Technique Usual hand hygeine, masks, and gloves were used   Procedure Description Patient positioned in bed supine.  Sedation given as noted above.  Patient was intubated with endotracheal tube using Glidescope.  View was Grade 1 full glottis .  Number of attempts was 1.  Colorimetric CO2 detector was consistent with tracheal placement.   Complications/Tolerance None; patient tolerated the procedure well. Chest X-ray is ordered to verify placement.   EBL Minimal   Specimen(s) None

## 2023-08-03 NOTE — Progress Notes (Signed)
 NAME:  Frank Lightsey., MRN:  [id number], DOB:  11/24/1965, LOS: 31 ADMISSION DATE:  07/03/2023, CONSULTATION DATE:  07/06/23 REFERRING MD:  Dr Maury Space, CHIEF COMPLAINT:  Encephalopathy   History of Present Illness:   PCCM asked to see patient for encephalopathy   Transferred from [facility] with 1 week of generalized weakness, altered mentation, decreased appetite Recently being worked up for multiple myeloma.  Transferred to [facility] for oncology evaluation Nephrology consulted for AKI   Background history of obesity, asthma, obstructive sleep apnea on CPAP GERD, hypertension, hyperlipidemia Has been feeling weak for the last few days according to spouse Admitted with concern for sepsis, community-acquired pneumonia, antibiotic encephalopathy Workup revealed lytic bony lesions with concern for multiple myeloma Transfused with 3 units packed red blood cells Evaluation so far-bone marrow 07/04/2023  Pertinent  Medical History   Past Medical History:  Diagnosis Date   Anxiety    Arthritis    Asthma    Bipolar disorder (HCC)    Current every day smoker    Depression    Diabetes mellitus, type II (HCC)    Dyspnea    History of kidney stones    History of pneumonia    Hyperlipidemia    Hypertension    Morbid obesity (HCC)    Sedentary lifestyle    Seizures (HCC)    last seizure 12 yrs ago, no current problem   Sleep apnea    uses CPAP nightly   Significant Hospital Events: Including procedures, antibiotic start and stop dates in addition to other pertinent events   4/19-blood cultures, MRSA PCR negative 4/19 chest x-ray with mild pulmonary congestion 4/20 atrial fibrillation with RVR 4/21 nosebleed after IR NG attempt, NG placed by PCCM in afternoon, stable transferred to [facility] 4/22 intubated; some abla requiring prbcs; ct abd/pelvis showing acute groin hematoma tracking from posterior presumed from site of bone marrow biopsy; heparin  held EEG 4/25 >> moderate to severe  encephalopathy without any seizures or epileptiform discharges 4/23 respiratory culture >> MRSE 4/28 remains intubated on CRRT, down to one pressor  4/29 labile BP, NE back up. Responded a bit to fluids. Received Velcade  and Cytoxan  chemotherapy 5/1 remains encephalopathic, check CTH 5/2 LP ETT exchange  5/5 1 PRBC. Palliative care met w family  5/6 IVIG. plan for trach established. Added midodrine  after failed vaso wean. Abx/fungal changed to levaquin   5/7 weaning pressors incr midodrine  incr lactulose . Vaso off  5/8 Trach placed. Some post op bleeding improved with thrombi-pad 5/10 voriconazole  added back based on CT Chest findings, MRI brain with no acute changes 5/13 more alert and able to follow some commands today  Interim History / Subjective:  Cortrak open again. St  Objective   Blood pressure (!) 88/56, pulse 74, temperature 98.1 F (36.7 C), temperature source Axillary, resp. rate 11, height 6' 0.01" (1.829 m), weight 96.8 kg, SpO2 99%.    FiO2 (%):  [30 %] 30 %   Intake/Output Summary (Last 24 hours) at 08/03/2023 0754 Last data filed at 08/03/2023 0700 Gross per 24 hour  Intake 2422.19 ml  Output 450 ml  Net 1972.19 ml   Filed Weights   08/01/23 1718 08/02/23 0433 08/03/23 0322  Weight: 100.8 kg 96.1 kg 96.8 kg   Gen:      acutely and chronically ill appearing HEENT:  on T-piece Lungs:   no wheeze CV:         RRR Abd:      soft Ext:  No edema Skin:      Warm and dry; no rashes Neuro:  RASS 0 to -1, follows commands, very weak, does not verbalize _______________________________________________________  Micro: 4/28 BCx > no growth, final 4/30 respiratory culture-few PMN, rare budding yeast, abundant GPC> few aspergillus fumigatus Quantiferon 5/2 > neg Trach asp 5/4> rare GPC, rare mold  PCP smear trach asp 5/5 > neg  Trach asp 5/9 > abundant staph hemolyticus, few budding yeast Trach aspirate 5/15 - ngtd Blood cultures 5/11-no growth  CSF studies 1 RBC,  1 WBC, glucose 106 CSF culture-2 organisms acute, rare WBC> NGTD Cryptococcal antigen negative meningitis and encephalitis panel negative VDRL  non-reactive   AFB CSF 5/2 >>>>  Labs reviewed Na 135 K 3.6 Cr 293 BUN 114 Calcium  8.7 Phos unable to interpret was 6.1 2 days ago WBC 11.1 Hgb 8.9 Plt 330  Resolved Problems  Shock  Assessment & Plan:   Metabolic encephalopathy Seizure disorder Hyperammonemia Hypoactive delirium -continue lactulose  -Paxil  and gabapentin  -Oxycodone  - continue clonazepam  taper  Acute hypoxemic respiratory failure Bilateral pleural effusions History of obstructive sleep apnea History of tobacco abuse Aspergillus pneumonia - trach collar x 48 hours today. Could consider transfer to progressive on 5/19 if tolerating 72 hours off trach collar - trach care - prn bronchodilators - On voriconazole  for anticipated prolonged course per ID - volume removal through HD  Acute kidney injury on chronic kidney disease stage IIIa - CRRT was stopped on 5/10 - Now on intermittent dialysis - continue volume removal as tolerated as he remains net positive for his 30+ day admission - repeat bmet pending this am  Multiple myeloma -Received Velcade  and Cytoxan  on 07/08/2023, second dose 07/11/2023 Also did receive high-dose steroids  - His chemotherapy remains on hold at present - probably would not be receiving chemotherapy while on anti-fungal therapy  Paroxysmal atrial fibrillation in the setting of acute illness Now in sinus rhythm - Plan to discontinue amiodarone  5/20 - Consider anticoagulation if he goes back into atrial fibrillation  Type 2 diabetes - SSI - CBG<180. Holding q4 coverage for now and will add in as needed given recent decrease in dextrose  from tube feeds  GOC: See IPAL from 5/7. Wife now updating goals to include 2-3 rounds of CPR maximum should he arrest. LTAC disposition pending, referral to select has been made.  Appreciate palliative  care involvement regarding goals of care  Best Practice (right click and "Reselect all SmartList Selections" daily)   Diet/type: tubefeeds  DVT prophylaxis prophylactic heparin   Pressure ulcer(s): identified on: 19/Apr/2025 GI prophylaxis: H2B Lines: Dialysis Catheter Foley:  na  Code Status:  full code Last date of multidisciplinary goals of care discussion d/w wife 5/7. See IPAL. Updated spouse at bedside 07/30/2023  I spent 50 minutes in total visit time for this patient, with more than 50% spent counseling/coordinating care.

## 2023-08-03 NOTE — Progress Notes (Addendum)
 eLink Physician-Brief Progress Note Patient Name: Frank [name]. DOB: 1965-10-07 MRN: [id number]   Date of Service  08/03/2023  HPI/Events of Note  58 year old hours initially tracheostomy 8 for prolonged respiratory failure on 5/8 and had a malposition of his tracheostomy today after emergent intubation.  On bedside exam, there is concern that the left-sided tracheostomy sutures displaced Difficult to evaluate, the patient is in no immediate distress and ventilator volumes are appropriate.  eICU Interventions  Will request ground team evaluation, no immediate intervention.   2256 - concern patient was agitated and needed medication, more tachycardic otherwise hemodynamically stable, obtunded on examination, no immediate intervention  Intervention Category Intermediate Interventions: Respiratory distress - evaluation and management  Noam Karaffa 08/03/2023, 8:15 PM

## 2023-08-03 NOTE — Plan of Care (Signed)
  Problem: Nutrition: Goal: Adequate nutrition will be maintained Outcome: Progressing   Problem: Safety: Goal: Ability to remain free from injury will improve Outcome: Progressing   Problem: Pain Managment: Goal: General experience of comfort will improve and/or be controlled Outcome: Progressing

## 2023-08-04 DIAGNOSIS — R509 Fever, unspecified: Secondary | ICD-10-CM | POA: Diagnosis not present

## 2023-08-04 DIAGNOSIS — B449 Aspergillosis, unspecified: Secondary | ICD-10-CM | POA: Diagnosis not present

## 2023-08-04 DIAGNOSIS — G9341 Metabolic encephalopathy: Secondary | ICD-10-CM | POA: Diagnosis not present

## 2023-08-04 DIAGNOSIS — J168 Pneumonia due to other specified infectious organisms: Secondary | ICD-10-CM | POA: Diagnosis not present

## 2023-08-04 DIAGNOSIS — Z930 Tracheostomy status: Secondary | ICD-10-CM

## 2023-08-04 DIAGNOSIS — N179 Acute kidney failure, unspecified: Secondary | ICD-10-CM | POA: Diagnosis not present

## 2023-08-04 DIAGNOSIS — J90 Pleural effusion, not elsewhere classified: Secondary | ICD-10-CM | POA: Diagnosis not present

## 2023-08-04 DIAGNOSIS — B440 Invasive pulmonary aspergillosis: Secondary | ICD-10-CM | POA: Diagnosis not present

## 2023-08-04 DIAGNOSIS — J9601 Acute respiratory failure with hypoxia: Secondary | ICD-10-CM | POA: Diagnosis not present

## 2023-08-04 LAB — CULTURE, RESPIRATORY W GRAM STAIN: Gram Stain: NONE SEEN

## 2023-08-04 LAB — GLUCOSE, CAPILLARY
Glucose-Capillary: 140 mg/dL — ABNORMAL HIGH (ref 70–99)
Glucose-Capillary: 161 mg/dL — ABNORMAL HIGH (ref 70–99)
Glucose-Capillary: 171 mg/dL — ABNORMAL HIGH (ref 70–99)
Glucose-Capillary: 174 mg/dL — ABNORMAL HIGH (ref 70–99)
Glucose-Capillary: 176 mg/dL — ABNORMAL HIGH (ref 70–99)
Glucose-Capillary: 200 mg/dL — ABNORMAL HIGH (ref 70–99)

## 2023-08-04 LAB — MISC LABCORP TEST (SEND OUT): Labcorp test code: 1024

## 2023-08-04 LAB — ZINC: Zinc: 73 ug/dL (ref 44–115)

## 2023-08-04 LAB — PHOSPHORUS: Phosphorus: 5.3 mg/dL — ABNORMAL HIGH (ref 2.5–4.6)

## 2023-08-04 MED ORDER — HEPARIN SODIUM (PORCINE) 1000 UNIT/ML DIALYSIS: 2500.0000 [IU] | Freq: Once | INTRAMUSCULAR | Status: DC

## 2023-08-04 MED ORDER — LIDOCAINE HCL (PF) 1 % IJ SOLN: 5.0000 mL | INTRAMUSCULAR | Status: DC | PRN

## 2023-08-04 MED ORDER — NEPRO/CARBSTEADY PO LIQD: 237.0000 mL | ORAL | Status: DC | PRN

## 2023-08-04 MED ORDER — HEPARIN SODIUM (PORCINE) 1000 UNIT/ML DIALYSIS: 2500.0000 [IU] | INTRAMUSCULAR | Status: DC | PRN

## 2023-08-04 MED ORDER — HEPARIN SODIUM (PORCINE) 1000 UNIT/ML IJ SOLN
INTRAMUSCULAR | Status: AC
  Filled 2023-08-04: qty 8

## 2023-08-04 MED ORDER — VITAL 1.5 CAL PO LIQD
1000.0000 mL | ORAL | Status: DC
  Administered 2023-08-04 – 2023-08-12 (×11): 1000 mL
  Filled 2023-08-04 (×11): qty 1000

## 2023-08-04 MED ORDER — LIDOCAINE-PRILOCAINE 2.5-2.5 % EX CREA: 1.0000 | TOPICAL_CREAM | CUTANEOUS | Status: DC | PRN

## 2023-08-04 MED ORDER — PENTAFLUOROPROP-TETRAFLUOROETH EX AERO: 1.0000 | INHALATION_SPRAY | CUTANEOUS | Status: DC | PRN

## 2023-08-04 MED ORDER — HEPARIN SODIUM (PORCINE) 1000 UNIT/ML DIALYSIS
1000.0000 [IU] | INTRAMUSCULAR | Status: DC | PRN
  Administered 2023-08-08: 3800 [IU]
  Filled 2023-08-04: qty 1

## 2023-08-04 MED ORDER — OXYCODONE HCL 5 MG PO TABS
5.0000 mg | ORAL_TABLET | Freq: Four times a day (QID) | ORAL | Status: DC
  Administered 2023-08-04 – 2023-08-05 (×4): 5 mg
  Filled 2023-08-04 (×4): qty 1

## 2023-08-04 MED ORDER — ANTICOAGULANT SODIUM CITRATE 4% (200MG/5ML) IV SOLN: 5.0000 mL | Status: DC | PRN

## 2023-08-04 NOTE — Progress Notes (Signed)
 [facility]  Date of Admission:  07/03/2023       Lines: 4/23-c right internal jugular hd cath tracheostomy  Abx: 5/4-6; 5/10-c vori  5/12-16 vanc 5/10-11 amp-sulb 5/6-13 levo  5/1-5/6 meropenem  4/28-5/6 vanc 4/23-5/1 piptazo 4/17-4/22 ceftriaxone  4/17-4/20 azith   ASSESSMENT: 58 yo male with left hip an knee arthroplasty, aggressive mm, left arm pathologic fracture, tobacco abuse/copd, marijuanna use, ckd3, admitted 07/08/23 in transfer for 1 week confusion found to have aki, acute on chronic anemia treated for MM complicated by neutropenia, fever, pna with aspergillus on sputum cx and progressive pulm opacity despite typical abx coverage  08/04/23 so far patient despite multiple bsAbx round and antifungal still have fever  Other comlications Aki on crrt Thighs hematoma  Micro: 5/15 resp cx normal flora 5/11 bcx negative 5/09 resp culture staph hemolyticus 5/05 bcx negative 5/05 mrsa nares pcr negative 5/04 sputum cx rare mold 5/02 lp cx negative 4/30 resp cx aspergillus 4/23 resp cx staph epi    #pna #aspergillus sputum cx S/p tx for HAP On vori for presumed aspergillus pna -- plan at least 3 months treatment  #fever #neutropenia had resolved by 07/22/23 in setting stopping chemo and use of G-csf  Ongoing fever  Things we can look at -> line holiday and have discussed with pulm/ccm and renal to remove the hd line to give 3 day line holiday  Suspect paraneoplastic fever still  Next week if still fevering could consider aspirating the thigh hematoma and send for culture   #mm #pathologic left arm fx Poor prognosis Ongoing fever so far negative ID workup -- will defer to onc and patient family regarding ongoing chemo   #resp failure S/p trach 5/08   #ams Question of intermittently following commands 5/02 lp no leukocytosis or abnormal chemistry; no cytology done   #thigh fluid collection Presumed  hematoma; question if infected   Principal Problem:   Generalized weakness Active Problems:   Multiple myeloma not having achieved remission (HCC)   Atrial fibrillation with rapid ventricular response (HCC)   AKI (acute kidney injury) (HCC)   Acute hypoxemic respiratory failure (HCC)   PAF (paroxysmal atrial fibrillation) (HCC)   Acute encephalopathy   Severe sepsis with septic shock (HCC)   On mechanically assisted ventilation (HCC)   Acute metabolic encephalopathy   Lobar pneumonia, unspecified organism (HCC)   Multiple myeloma without remission (HCC)   Protein-calorie malnutrition, severe   Malfunction of tracheostomy stoma (HCC)   Allergies  Allergen Reactions   Morphine And Codeine Anaphylaxis   Shellfish Allergy Anaphylaxis   Betadine  [Povidone Iodine ] Itching   Bupropion Other (See Comments)    Shaking of the body, hallucinations    Chlorhexidine  Hives    Patient reports never had issues CHG with mouth rinse.   Influenza Vaccines Hives   Metoprolol  Rash    Scheduled Meds:  arformoterol   15 mcg Nebulization BID   busPIRone   15 mg Per Tube QHS   feeding supplement (PROSource TF20)  60 mL Per Tube BID   folic acid   1 mg Per Tube Daily   gabapentin   200 mg Per Tube TID   [START ON 08/05/2023] heparin   2,500 Units Dialysis Once in dialysis   heparin  injection (subcutaneous)  5,000 Units Subcutaneous Q8H   insulin  aspart  0-20 Units Subcutaneous Q4H   insulin  glargine-yfgn  8 Units Subcutaneous BID   lactulose   10 g Per Tube BID   leptospermum manuka honey  1 Application Topical Daily   lidocaine -EPINEPHrine   20 mL Intradermal Once   multivitamin  1 tablet Per Tube QHS   nicotine   7 mg Transdermal Daily   nutrition supplement (JUVEN)  1 packet Per Tube BID BM   mouth rinse  15 mL Mouth Rinse Q2H   oxyCODONE   5 mg Per Tube Q6H   PARoxetine   40 mg Per Tube Daily   pneumococcal 20-valent conjugate vaccine  0.5 mL Intramuscular Tomorrow-1000   revefenacin   175 mcg  Nebulization Daily   sodium chloride  flush  10-40 mL Intracatheter Q12H   thiamine   100 mg Per Tube Daily   Continuous Infusions:  anticoagulant sodium citrate      feeding supplement (VITAL 1.5 CAL) 1,000 mL (08/04/23 1638)   sodium chloride      voriconazole  Stopped (08/04/23 1158)   PRN Meds:.acetaminophen  (TYLENOL ) oral liquid 160 mg/5 mL **OR** acetaminophen , anticoagulant sodium citrate , clonazePAM , heparin , [START ON 08/05/2023] heparin , ipratropium-albuterol , lidocaine  (PF), lidocaine -prilocaine , ondansetron  (ZOFRAN ) IV, mouth rinse, pentafluoroprop-tetrafluoroeth, polyvinyl alcohol , sodium chloride  flush   SUBJECTIVE: Ongoing fever Thigh fluid collection Trach collar  Review of Systems: ROS All other ROS was negative, except mentioned above     OBJECTIVE: Vitals:   08/04/23 1529 08/04/23 1530 08/04/23 1615 08/04/23 1630  BP:  106/70 107/69 113/74  Pulse:      Resp:      Temp: (!) 100.9 F (38.3 C)     TempSrc: Axillary     SpO2:      Weight:      Height:       Body mass index is 30.79 kg/m.  Physical Exam General/constitutional: ill appearing HEENT: Normocephalic, PER, Conj Clear Neck: trach site no purulence CV: tachy no mrg Lungs: course bs Abd: Soft, Nontender Skin: No Rash Neuro: sedated/comatose MSK: no peripheral joint swelling/tenderness/warmth; right thigh hematoma warm to touch; left humerus fx site visualized grossly    Lab Results Lab Results  Component Value Date   WBC 11.1 (H) 08/03/2023   HGB 8.9 (L) 08/03/2023   HCT 29.5 (L) 08/03/2023   MCV 101.7 (H) 08/03/2023   PLT 330 08/03/2023    Lab Results  Component Value Date   CREATININE 4.93 (H) 08/03/2023   BUN 114 (H) 08/03/2023   NA 135 08/03/2023   K 3.6 08/03/2023   CL 98 08/03/2023   CO2 24 08/03/2023    Lab Results  Component Value Date   ALT 11 08/01/2023   AST 21 08/01/2023   ALKPHOS 79 08/01/2023   BILITOT 0.8 08/01/2023      Microbiology: Recent Results (from  the past 240 hours)  Culture, blood (Routine X 2) w Reflex to ID Panel     Status: None   Collection Time: 07/27/23  5:30 PM   Specimen: BLOOD  Result Value Ref Range Status   Specimen Description BLOOD SITE NOT SPECIFIED  Final   Special Requests   Final    BOTTLES DRAWN AEROBIC AND ANAEROBIC Blood Culture results may not be optimal due to an inadequate volume of blood received in culture bottles   Culture   Final    NO GROWTH 5 DAYS Performed at [facility], [address], [place], [place] [address]    Report Status 08/01/2023 FINAL  Final  Culture, blood (Routine X 2) w Reflex to ID Panel     Status: None   Collection Time: 07/27/23  5:30 PM   Specimen: BLOOD  Result Value Ref Range Status   Specimen Description  BLOOD SITE NOT SPECIFIED  Final   Special Requests   Final    BOTTLES DRAWN AEROBIC AND ANAEROBIC Blood Culture results may not be optimal due to an inadequate volume of blood received in culture bottles   Culture   Final    NO GROWTH 5 DAYS Performed at [facility], [address], [place], [place] [address]    Report Status 08/01/2023 FINAL  Final  Culture, Respiratory w Gram Stain     Status: None   Collection Time: 07/31/23  9:38 AM   Specimen: Tracheal Aspirate; Respiratory  Result Value Ref Range Status   Specimen Description TRACHEAL ASPIRATE  Final   Special Requests NONE  Final   Gram Stain   Final    RARE WBC PRESENT, PREDOMINANTLY PMN NO ORGANISMS SEEN    Culture   Final    FEW Normal respiratory flora-no Staph aureus or Pseudomonas seen Performed at [facility], [address], [place], [place] [address]    Report Status 08/02/2023 FINAL  Final     Serology:   Imaging: If present, new imagings (plain films, ct scans, and mri) have been personally visualized and interpreted; radiology reports have been reviewed. Decision making incorporated into the Impression / Recommendations.   Jamesetta Mcbride, MD [facility] [phone] pager    08/04/2023, 5:08 PM

## 2023-08-04 NOTE — Progress Notes (Signed)
 Nutrition Follow-up  DOCUMENTATION CODES:  Severe malnutrition in context of acute illness/injury  INTERVENTION:  Continue tube feeding via cortrak: Change to Vital 1.5 at 65 ml/h (1560 ml per day), this is a lower fat, semi-elemental formula for easier digestion / absorption.  Prosource TF20 60 ml BID Provides 2500 kcal, 145 gm protein, 1192 ml free water  daily Continue Juven 1 packet BID via tube, each packet provides 95 calories, 2.5 grams of protein (collagen) + micronutrients to support wound healing Continue Renal MVI daily via tube Monitor the results of vitamin A and zinc  labs for deficiency  NUTRITION DIAGNOSIS:  Severe Malnutrition related to acute illness (prolonged illness, interuptions in feeding) as evidenced by moderate fat depletion, moderate muscle depletion, percent weight loss (9.6% x 1 month). - remains applicable  GOAL:  Patient will meet greater than or equal to 90% of their needs - met with TF  MONITOR:  Vent status, Labs, Weight trends, TF tolerance  REASON FOR ASSESSMENT:  Consult Assessment of nutrition requirement/status  ASSESSMENT:  58 y.o. male with PMH of obesity, asthma, OSA on CPAP, GERD, HTN, HLD who presented due to feeling weak with AMS for a few days. Admitted with concern for sepsis, community-acquired pneumonia, encephalopathy as well as concern for multiple myeloma.  4/17 Admit to [facility] 4/21 SLP eval - NPO; IR attempted Cortrak but unable to be placed; CCM MD successfully placed NGT 4/22 Concern for aspiration overnight; Intubated 4/23 - CRRT 5/8 - tracheostomy 5/10 - CRRT discontinued 5/11 - transferred to [facility] 5/12 - iHD initiated   Trach accidentally dislodged 5/18, replaced.  Required vent support overnight. Currently on trach collar. No longer requiring pressors.  For iHD today. HD nurse at bedside.  Patient with ongoing fevers.  Treatment for aspergillus PNA ongoing.  Stool output 835 ml x 24 hours, remains  high, but has improved some.   Receiving Nepro at 55 ml/h via small bore feeding tube. Suspect high fat content of Nepro is contributing to excess stooling. RD to change to a lower fat, semi-elemental formula for better absorption.  Receiving Juven to promote wound healing.   Admit weight: 110.6 kg (4/17) Current weight: 103 kg (5/19)   Intake/Output Summary (Last 24 hours) at 08/04/2023 1524 Last data filed at 08/04/2023 1435 Gross per 24 hour  Intake 1773.2 ml  Output 850 ml  Net 923.2 ml  Net IO Since Admission: 11,897.47 mL [08/04/23 1524] UOP 50 mL x 24 hours  Drains/Lines: Small bore tube, 10 Fr. Temporary HD catheter, triple lumen right IJ FMS 835 mL x 24 hours; 550 ml in current bag Tracheostomy   Labs reviewed. Phos 5.3, folate 17.8 WNL (5/16) CBG: [phone]  Medications reviewed and include folic acid , novolog , semglee , lactulose , renal MVI, juven, thiamine .  Diet Order:   Diet Order             Diet NPO time specified  Diet effective midnight                  EDUCATION NEEDS:  Not appropriate for education at this time  Skin: Skin Integrity Issues: Unstageable:  - Left and Right Heel - mid Buttocks (14 x 10 x 0.1 cm) Pressure Injury to right lower buttocks  Last BM:  5/19 type 7 FMS placed on 4/20  Height:  Ht Readings from Last 1 Encounters:  07/22/23 6' 0.01" (1.829 m)   Weight:  Wt Readings from Last 1 Encounters:  08/04/23 103 kg    Ideal  Body Weight:  80.9 kg  BMI:  Body mass index is 30.79 kg/m.  Estimated Nutritional Needs:  Kcal:  2400-2700 kcal/d Protein:  130-150g/d Fluid:  1L+UOP   Barnet Boots RD, LDN, CNSC Contact via secure chat. If unavailable, use group chat "RD Inpatient."

## 2023-08-04 NOTE — Progress Notes (Signed)
 Received patient in bed to unit. 2M10C Sedated Informed consent signed and in chart. Yes  TX duration: 3.16  Patient tolerated well. Yes  No acute distress.  Hand-off given to patient's nurse.  Jenette Mitchell RN  Access used: Right IJ Access issues: None  Total UF removed: 0.3 Medication(s) given: None (heparin  during tx per ports) Post HD weight: 103.0KG Post HD VS: 134/84, 95 MAP, 95% on Vent /Trach collar, 17 Resp, 106 Pulse.   Shaydon Lease Arvilla Birmingham RN, DNP  [facility]

## 2023-08-04 NOTE — Progress Notes (Signed)
 SLP Cancellation Note  Patient Details Name: Frank [name]. MRN: [id number] DOB: 1965/11/29   Cancelled treatment:       Reason Eval/Treat Not Completed: Medical issues which prohibited therapy- pt expelled large bloody clot then bloody secretions. Tolerating TC.  D/W RN. Hold on PMV for today. Will follow.  Jenaro Souder L. Beatris Lincoln, MA CCC/SLP Clinical Specialist - Acute Care SLP Acute Rehabilitation Services Office number [phone]    [name] 08/04/2023, 4:16 PM

## 2023-08-04 NOTE — Progress Notes (Signed)
 Physical Therapy Treatment Patient Details Name: Frank [name]. MRN: [id number] DOB: 1965-11-18 Today's Date: 08/04/2023   History of Present Illness 58 y.o. male transferred from [facility] 07/03/23 to [facility] for management of newly diagnosed plasma cell myeloma with weakness and AMS. Pt also with AKI and metabolic encephalopathy. 5/10 transfer to [facility] for iHD. 4/22 Intubated and chemo initiated. 4/23-5/10 CRRT. 5/6 IVIG started. 5/8 trach. 5/12 iHD initiated. 5/18 inability to oxygenate through trach, intubated and trach revision. PMHx:Lt THA, carpal tunnel syndrome, Lt TKA, Lt humerus fx, PAF, HTN, HLD, bipolar disorder, obesity, T2DM    PT Comments  Pt obtunded this session with limited automatic movement of LLE but no attention to noxious stimuli or cues. PROM LB and RUE performed as RN stating that wife mentioned prior left humerus fx and concerned with reinjury. Pt without response to movement or command this session.   Vent PRVC 40% with SPO2 98%, HR 110    If plan is discharge home, recommend the following: Two people to help with walking and/or transfers;Two people to help with bathing/dressing/bathroom;Assistance with cooking/housework;Assistance with feeding;Assist for transportation;Direct supervision/assist for financial management;Direct supervision/assist for medications management   Can travel by private vehicle     No  Equipment Recommendations  Hospital bed;Hoyer lift    Recommendations for Other Services       Precautions / Restrictions Precautions Precautions: Fall;Other (comment) Recall of Precautions/Restrictions: Impaired Precaution/Restrictions Comments: trach, vent, cortrak     Mobility  Bed Mobility Overal bed mobility: Needs Assistance             General bed mobility comments: total assist to position into chair/seated position in bed with max assist to position head in neutral, pt not responding to cues or stimulation for neck or trunk  movement    Transfers                        Ambulation/Gait                   Stairs             Wheelchair Mobility     Tilt Bed    Modified Rankin (Stroke Patients Only)       Balance                                            Communication Communication Communication: Impaired Factors Affecting Communication: Trach/intubated  Cognition Arousal: Stuporous, Suspect due to medications                             PT - Cognition Comments: pt with partial eye opening beginning of session with washcloth to face and stimulation but did not maintain arousal, no response to pain or commands this session Following commands: Impaired      Cueing    Exercises General Exercises - Upper Extremity Shoulder Flexion: PROM, Right, Supine Elbow Flexion: PROM, Right, Seated, 10 reps Elbow Extension: PROM, Right, Seated, 10 reps General Exercises - Lower Extremity Ankle Circles/Pumps: PROM, Both, 10 reps, Seated Long Arc Quad: PROM, Both, 10 reps, Seated Heel Slides: PROM, Both, 10 reps, Supine Other Exercises Other Exercises: PROM internal and external hip rotation, supine, both x 10 reps    General Comments        Pertinent Vitals/Pain  Pain Assessment Pain Assessment: CPOT Faces Pain Scale: No hurt Facial Expression: Relaxed, neutral Body Movements: Restlessness Muscle Tension: Relaxed Compliance with ventilator (intubated pts.): Tolerating ventilator or movement Vocalization (extubated pts.): N/A CPOT Total: 2    Home Living                          Prior Function            PT Goals (current goals can now be found in the care plan section) Progress towards PT goals: Not progressing toward goals - comment    Frequency    Min 1X/week      PT Plan      Co-evaluation              AM-PAC PT "6 Clicks" Mobility   Outcome Measure  Help needed turning from your back to your side  while in a flat bed without using bedrails?: Total Help needed moving from lying on your back to sitting on the side of a flat bed without using bedrails?: Total Help needed moving to and from a bed to a chair (including a wheelchair)?: Total Help needed standing up from a chair using your arms (e.g., wheelchair or bedside chair)?: Total Help needed to walk in hospital room?: Total Help needed climbing 3-5 steps with a railing? : Total 6 Click Score: 6    End of Session   Activity Tolerance: Patient limited by lethargy Patient left: in bed;with call bell/phone within reach;with nursing/sitter in room Nurse Communication: Need for lift equipment;Mobility status PT Visit Diagnosis: Other abnormalities of gait and mobility (R26.89);Muscle weakness (generalized) (M62.81);Difficulty in walking, not elsewhere classified (R26.2)     Time: 4098-1191 PT Time Calculation (min) (ACUTE ONLY): 16 min  Charges:    $Therapeutic Activity: 8-22 mins PT General Charges $$ ACUTE PT VISIT: 1 Visit                     Annis Baseman, PT Acute Rehabilitation Services Office: [phone]    Annalise Mcdiarmid B Arora Coakley 08/04/2023, 11:01 AM

## 2023-08-04 NOTE — Progress Notes (Signed)
 NAME:  Frank [name]., MRN:  [id number], DOB:  [birth year], LOS: 32 ADMISSION DATE:  07/03/2023, CONSULTATION DATE:  07/06/23 REFERRING MD:  Dr Maury Space, CHIEF COMPLAINT:  Encephalopathy   History of Present Illness:   PCCM asked to see patient for encephalopathy   Transferred from [facility] with 1 week of generalized weakness, altered mentation, decreased appetite Recently being worked up for multiple myeloma.  Transferred to [facility] for oncology evaluation Nephrology consulted for AKI   Background history of obesity, asthma, obstructive sleep apnea on CPAP GERD, hypertension, hyperlipidemia Has been feeling weak for the last few days according to spouse Admitted with concern for sepsis, community-acquired pneumonia, antibiotic encephalopathy Workup revealed lytic bony lesions with concern for multiple myeloma Transfused with 3 units packed red blood cells Evaluation so far-bone marrow 07/04/2023  Pertinent  Medical History   Past Medical History:  Diagnosis Date   Anxiety    Arthritis    Asthma    Bipolar disorder (HCC)    Current every day smoker    Depression    Diabetes mellitus, type II (HCC)    Dyspnea    History of kidney stones    History of pneumonia    Hyperlipidemia    Hypertension    Morbid obesity (HCC)    Sedentary lifestyle    Seizures (HCC)    last seizure 12 yrs ago, no current problem   Sleep apnea    uses CPAP nightly   Significant Hospital Events: Including procedures, antibiotic start and stop dates in addition to other pertinent events   4/19-blood cultures, MRSA PCR negative 4/19 chest x-ray with mild pulmonary congestion 4/20 atrial fibrillation with RVR 4/21 nosebleed after IR NG attempt, NG placed by PCCM in afternoon, stable transferred to [facility] 4/22 intubated; some abla requiring prbcs; ct abd/pelvis showing acute groin hematoma tracking from posterior presumed from site of bone marrow biopsy; heparin  held EEG 4/25 >> moderate to severe  encephalopathy without any seizures or epileptiform discharges 4/23 respiratory culture >> MRSE 4/28 remains intubated on CRRT, down to one pressor  4/29 labile BP, NE back up. Responded a bit to fluids. Received Velcade  and Cytoxan  chemotherapy 5/1 remains encephalopathic, check CTH 5/2 LP ETT exchange  5/5 1 PRBC. Palliative care met w family  5/6 IVIG. plan for trach established. Added midodrine  after failed vaso wean. Abx/fungal changed to levaquin   5/7 weaning pressors incr midodrine  incr lactulose . Vaso off  5/8 Trach placed. Some post op bleeding improved with thrombi-pad 5/10 voriconazole  added back based on CT Chest findings, MRI brain with no acute changes 5/13 more alert and able to follow some commands today 5/19 and had been doing well on trach collar, and was on the verge of transferring to progressive when had a trach dislodgment.  Interim History / Subjective:   Trach displacement yesterday.  Remains minimally responsive.  Objective   Blood pressure 136/84, pulse (!) 112, temperature (!) 102 F (38.9 C), temperature source Axillary, resp. rate (!) 23, height 6' 0.01" (1.829 m), weight 100.1 kg, SpO2 96%.    Vent Mode: PRVC FiO2 (%):  [30 %-40 %] 40 % Set Rate:  [20 bmp] 20 bmp Vt Set:  [620 mL] 620 mL PEEP:  [5 cmH20] 5 cmH20 Plateau Pressure:  [13 cmH20-19 cmH20] 13 cmH20   Intake/Output Summary (Last 24 hours) at 08/04/2023 0839 Last data filed at 08/04/2023 0600 Gross per 24 hour  Intake 1513.48 ml  Output 850 ml  Net 663.48 ml  Filed Weights   08/02/23 0433 08/03/23 0322 08/04/23 0348  Weight: 96.1 kg 96.8 kg 100.1 kg   Gen:      acutely and chronically ill appearing HEENT: Tracheostomy in place. Lungs:   Clear to auscultation bilaterally. CV:         RRR Abd:      soft Ext:    No edema Skin:      Warm and dry; no rashes Neuro: Unresponsive.  Ancillary tests personally reviewed   Micro: 4/28 BCx > no growth, final 4/30 respiratory culture-few  PMN, rare budding yeast, abundant GPC> few aspergillus fumigatus Quantiferon 5/2 > neg Trach asp 5/4> rare GPC, rare mold  PCP smear trach asp 5/5 > neg  Trach asp 5/9 > abundant staph hemolyticus, few budding yeast Trach aspirate 5/15 - ngtd Blood cultures 5/11-no growth  CSF studies 1 RBC, 1 WBC, glucose 106 CSF culture-2 organisms acute, rare WBC> NGTD Cryptococcal antigen negative meningitis and encephalitis panel negative VDRL  non-reactive   Assessment & Plan:   Metabolic encephalopathy Seizure disorder Hyperammonemia Hypoactive delirium Acute hypoxemic respiratory failure Bilateral pleural effusions History of obstructive sleep apnea History of tobacco abuse Aspergillus pneumonia Acute kidney injury on chronic kidney disease stage IIIa Multiple myeloma Paroxysmal atrial fibrillation in the setting of acute illness, now back in sinus rhythm. Now in sinus rhythm Type 2 diabetes  Plan:  - Transition back to trach collar today. - Continue to wean sedative medications. - Continue hemodialysis as per nephrology. - Complete treatment for suspected Aspergillus pneumonia as per ID. - Further treatment for multiple myeloma on hold until above pneumonia has been adequately treated. - Continue oral amiodarone .  GOC: See IPAL from 5/7. Wife now updating goals to include 2-3 rounds of CPR maximum should he arrest. LTAC disposition pending, referral to select has been made.  Appreciate palliative care involvement regarding goals of care  Best Practice (right click and "Reselect all SmartList Selections" daily)   Diet/type: tubefeeds  DVT prophylaxis prophylactic heparin   Pressure ulcer(s): identified on: 19/Apr/2025 GI prophylaxis: H2B Lines: Dialysis Catheter Foley:  na  Code Status:  full code Last date of multidisciplinary goals of care discussion d/w wife 5/7. See IPAL. Updated spouse at bedside 07/30/2023  CRITICAL CARE Performed by: Arlina Lair   Total critical  care time: 45 minutes  Critical care time was exclusive of separately billable procedures and treating other patients.  Critical care was necessary to treat or prevent imminent or life-threatening deterioration.  Critical care was time spent personally by me on the following activities: development of treatment plan with patient and/or surrogate as well as nursing, discussions with consultants, evaluation of patient's response to treatment, examination of patient, obtaining history from patient or surrogate, ordering and performing treatments and interventions, ordering and review of laboratory studies, ordering and review of radiographic studies, pulse oximetry, re-evaluation of patient's condition and participation in multidisciplinary rounds.  Arlina Lair, MD [facility] ICU Physician [facility]  Pager: [phone] Mobile: [phone] After hours: [phone].

## 2023-08-04 NOTE — Progress Notes (Signed)
 [facility] NEPHROLOGY PROGRESS NOTE   Subjective:  Seen in ICU.  Case discussed with CCM MD and primary RN Ongoing fevers, ID following On voriconazole  for pulmonary aspergillosis For dialysis today    Objective Vital signs in last 24 hours: Vitals:   07/26/23 1100 07/26/23 1125 07/26/23 1130 07/26/23 1200  BP: (!) 179/96 (!) 146/85 115/72 (!) 83/50  Pulse: (!) 105 96 93 76  Resp: 16 20 20 20   Temp:    99.8 F (37.7 C)  TempSrc:    Axillary  SpO2: 100% 98% 98% 99%  Weight:      Height:        Physical Exam: General: Critically ill looking male, intubated/ trach'd Heart:RRR, s1s2 nl Lungs: clear ant/ lat Abdomen:soft, non-tender, non-distended Extremities: no pitting edema, LUE distortion is broken bone not edema Neurology: responding minimally, on trach collar    Assessment/ Plan: Pt is a 58 y.o. yo male with past medical history significant for HTN, HLD, type II DM, BPH, seizure who was initially presented at [facility] due to AMS, admitted for sepsis due to pneumonia, encephalopathy, sepsis, AKI, hypercalcemia, symptomatic anemia and lytic lesion concern for multiple myeloma.  Problems # AKI on CKD 3a - b/l creat 1.1- 1.5 from 2024. Creat here 5.8 on presentation at OSH. AKI due to myeloma kidney +/- hyperCa +/- ACEi. Started CRRT on 4/23. Hemodynamics improved and CRRT dc'd 5/10. Is on iHD now. Doing MWF schedule here for now. Next HD today.  Plan to remove temporary HD catheter after treatment for ongoing fevers.  # AMS/acute metabolic encephalopathy: MRI, LP completed and unrevealing.  Waxes and wanes  #Volume - appears euvolemic.  Keep even UF here today, continue to monitor.  # Hypotension:Not on pressors with stable blood pressures, normotensive, resolved.  # Multiple myeloma: started on Velcade  and Cytoxan , #1 4/22 and #2 4/25. Labs showed good response, last checked 07/22/23. On hold for now given ongoing critical illness.  #AHRF/  aspergillus PNA/ s/p trach 5/8: on voriconazole  IV, off all other IV antibiotics. Off the ventilator on trach collar, per CCM  # Atrial fib-  on amio  #Anemia - prn transfusions. On epogen  per onc.   Charletta Cons, MD  07/26/2023, 1:31 PM  Recent Labs  Lab 07/24/23 0421 07/24/23 1630 07/25/23 1508 07/26/23 0423  HGB 8.9*  --   --  8.9*  ALBUMIN  1.6*   < > 1.6* 1.8*  1.6*  CALCIUM  7.4*   < > 7.7* 7.7*  7.7*  PHOS 2.0*  1.5*   < > RESULTS UNAVAILABLE DUE TO INTERFERING SUBSTANCE 1.9*  1.5*  CREATININE 1.59*   < > 1.62* 1.77*  1.74*  K 4.3   < > 4.6 4.4  4.5   < > = values in this interval not displayed.    Inpatient medications:  amiodarone   200 mg Per NG tube Daily   arformoterol   15 mcg Nebulization BID   busPIRone   15 mg Per Tube QHS   clonazePAM   1 mg Per Tube BID   docusate  100 mg Per Tube BID   epoetin  alfa  40,000 Units Subcutaneous QODAY   famotidine   10.4 mg Per Tube Daily   feeding supplement (PROSource TF20)  60 mL Per Tube TID   folic acid   1 mg Per Tube Daily   gabapentin   100 mg Per Tube Q12H   heparin  injection (subcutaneous)  5,000 Units Subcutaneous Q8H   insulin  aspart  0-20 Units Subcutaneous Q4H  insulin  aspart  8 Units Subcutaneous Q4H   insulin  glargine-yfgn  10 Units Subcutaneous BID   lactulose   20 g Per Tube TID   leptospermum manuka honey  1 Application Topical Daily   lidocaine -EPINEPHrine   20 mL Intradermal Once   midodrine   10 mg Per Tube TID WC   nicotine   7 mg Transdermal Daily   mouth rinse  15 mL Mouth Rinse Q2H   PARoxetine   40 mg Per Tube Daily   pneumococcal 20-valent conjugate vaccine  0.5 mL Intramuscular Tomorrow-1000   polyethylene glycol  17 g Per Tube Daily   QUEtiapine   50 mg Per Tube QHS   revefenacin   175 mcg Nebulization Daily   sodium chloride  flush  10-40 mL Intracatheter Q12H   sodium chloride  HYPERTONIC  4 mL Nebulization Daily   thiamine   100 mg Per Tube Daily    ampicillin -sulbactam (UNASYN ) IV Stopped  (07/26/23 1610)   dexmedetomidine  (PRECEDEX ) IV infusion 1.2 mcg/kg/hr (07/26/23 1300)   feeding supplement (VITAL 1.5 CAL) 60 mL/hr at 07/26/23 1300   fentaNYL  infusion INTRAVENOUS 200 mcg/hr (07/26/23 1300)   levofloxacin  (LEVAQUIN ) IV 100 mL/hr at 07/26/23 1300   midazolam      norepinephrine  (LEVOPHED ) Adult infusion 2 mcg/min (07/26/23 1300)   sodium PHOSPHATE  IVPB (in mmol)     voriconazole  Stopped (07/26/23 1225)   Followed by   [name] ON 07/27/2023] voriconazole      acetaminophen  (TYLENOL ) oral liquid 160 mg/5 mL **OR** acetaminophen , fentaNYL , ipratropium-albuterol , midazolam , ondansetron  (ZOFRAN ) IV, mouth rinse, oxyCODONE , polyethylene glycol, polyvinyl alcohol , sodium chloride  flush

## 2023-08-05 ENCOUNTER — Inpatient Hospital Stay (HOSPITAL_COMMUNITY)

## 2023-08-05 DIAGNOSIS — R509 Fever, unspecified: Secondary | ICD-10-CM | POA: Diagnosis not present

## 2023-08-05 DIAGNOSIS — J168 Pneumonia due to other specified infectious organisms: Secondary | ICD-10-CM | POA: Diagnosis not present

## 2023-08-05 DIAGNOSIS — B440 Invasive pulmonary aspergillosis: Secondary | ICD-10-CM | POA: Diagnosis not present

## 2023-08-05 DIAGNOSIS — B49 Unspecified mycosis: Secondary | ICD-10-CM | POA: Insufficient documentation

## 2023-08-05 DIAGNOSIS — J9601 Acute respiratory failure with hypoxia: Secondary | ICD-10-CM | POA: Diagnosis not present

## 2023-08-05 DIAGNOSIS — R531 Weakness: Secondary | ICD-10-CM | POA: Diagnosis not present

## 2023-08-05 DIAGNOSIS — N179 Acute kidney failure, unspecified: Secondary | ICD-10-CM | POA: Diagnosis not present

## 2023-08-05 DIAGNOSIS — J90 Pleural effusion, not elsewhere classified: Secondary | ICD-10-CM | POA: Diagnosis not present

## 2023-08-05 DIAGNOSIS — B449 Aspergillosis, unspecified: Secondary | ICD-10-CM | POA: Diagnosis not present

## 2023-08-05 DIAGNOSIS — G9341 Metabolic encephalopathy: Secondary | ICD-10-CM | POA: Diagnosis not present

## 2023-08-05 LAB — MULTIPLE MYELOMA PANEL, SERUM
Albumin SerPl Elph-Mcnc: 2.7 g/dL — ABNORMAL LOW (ref 2.9–4.4)
Albumin/Glob SerPl: 0.6 — ABNORMAL LOW (ref 0.7–1.7)
Alpha 1: 0.5 g/dL — ABNORMAL HIGH (ref 0.0–0.4)
Alpha2 Glob SerPl Elph-Mcnc: 0.6 g/dL (ref 0.4–1.0)
B-Globulin SerPl Elph-Mcnc: 1 g/dL (ref 0.7–1.3)
Gamma Glob SerPl Elph-Mcnc: 3.3 g/dL — ABNORMAL HIGH (ref 0.4–1.8)
Globulin, Total: 5.4 g/dL — ABNORMAL HIGH (ref 2.2–3.9)
IgA: 94 mg/dL (ref 90–386)
IgG (Immunoglobin G), Serum: 4543 mg/dL — ABNORMAL HIGH (ref 603–1613)
IgM (Immunoglobulin M), Srm: 6 mg/dL — ABNORMAL LOW (ref 20–172)
M Protein SerPl Elph-Mcnc: 2.8 g/dL — ABNORMAL HIGH
Total Protein ELP: 8.1 g/dL (ref 6.0–8.5)

## 2023-08-05 LAB — RENAL FUNCTION PANEL
Albumin: 1.9 g/dL — ABNORMAL LOW (ref 3.5–5.0)
Anion gap: 12 (ref 5–15)
BUN: 111 mg/dL — ABNORMAL HIGH (ref 6–20)
CO2: 24 mmol/L (ref 22–32)
Calcium: 8 mg/dL — ABNORMAL LOW (ref 8.9–10.3)
Chloride: 98 mmol/L (ref 98–111)
Creatinine, Ser: 4.08 mg/dL — ABNORMAL HIGH (ref 0.61–1.24)
GFR, Estimated: 16 mL/min — ABNORMAL LOW (ref >60)
Glucose, Bld: 196 mg/dL — ABNORMAL HIGH (ref 70–99)
Potassium: 3.9 mmol/L (ref 3.5–5.1)
Sodium: 134 mmol/L — ABNORMAL LOW (ref 135–145)

## 2023-08-05 LAB — KAPPA/LAMBDA LIGHT CHAINS
Kappa free light chain: 269.3 mg/L — ABNORMAL HIGH (ref 3.3–19.4)
Kappa, lambda light chain ratio: 5.69 — ABNORMAL HIGH (ref 0.26–1.65)
Lambda free light chains: 47.3 mg/L — ABNORMAL HIGH (ref 5.7–26.3)

## 2023-08-05 LAB — PHOSPHORUS: Phosphorus: 4.2 mg/dL (ref 2.5–4.6)

## 2023-08-05 LAB — VORICONAZOLE, SERUM: Voriconazole, Serum: 1.9 ug/mL

## 2023-08-05 LAB — GLUCOSE, CAPILLARY
Glucose-Capillary: 169 mg/dL — ABNORMAL HIGH (ref 70–99)
Glucose-Capillary: 192 mg/dL — ABNORMAL HIGH (ref 70–99)
Glucose-Capillary: 210 mg/dL — ABNORMAL HIGH (ref 70–99)
Glucose-Capillary: 150 mg/dL — ABNORMAL HIGH (ref 70–99)
Glucose-Capillary: 177 mg/dL — ABNORMAL HIGH (ref 70–99)

## 2023-08-05 MED ORDER — COLLAGENASE 250 UNIT/GM EX OINT
TOPICAL_OINTMENT | Freq: Every day | CUTANEOUS | Status: DC
  Administered 2023-08-07: 1 via TOPICAL
  Filled 2023-08-05 (×2): qty 30

## 2023-08-05 MED ORDER — OXYCODONE HCL 5 MG PO TABS
5.0000 mg | ORAL_TABLET | Freq: Four times a day (QID) | ORAL | Status: DC | PRN
  Administered 2023-08-08 – 2023-09-06 (×20): 5 mg
  Filled 2023-08-05 (×21): qty 1

## 2023-08-05 MED ORDER — VORICONAZOLE 50 MG PO TABS
350.0000 mg | ORAL_TABLET | Freq: Two times a day (BID) | ORAL | Status: DC
  Administered 2023-08-05 – 2023-09-11 (×70): 350 mg
  Filled 2023-08-05 (×80): qty 3

## 2023-08-05 NOTE — Consult Note (Signed)
 WOC Nurse wound follow up Wound type: pressure injuries Sacrum: Unstageable Pressure injury; 100% yellow Right buttock: Unstageable Pressure Injury: 100% yellow Left buttock: Unstageable Pressure injury: non viable tissue Right hip; 100% pink Left posterior thigh: Stage 3 full thickness; 50% yellow/50% pink Left posterior thigh: Stage 3 full thickness; 50% yellow/50% pink  Left posterior thigh Stage 3 Left posterior thigh Deep Tissue Pressure Injury; small 3 areas of dark purple non blanchable tissue Measurement: see nursing flow sheets Wound bed: see above  Drainage (amount, consistency, odor) see nursing flow sheet   Periwound: irritant contact dermatitis  Dressing procedure/placement/frequency: Changed debridement from Medihoney to enzymatic debridement with Santyl.  Low air loss mattress in place for moisture management and pressure redistribution Added Prevalon boots for heel offloading in high risk patient.  Xeroform to new areas posterior thigh and right hip.  Consult RD for wound supplementation  WOC Nurse team will follow with you and see patient within 10 days for wound assessments.  Please notify WOC nurses of any acute changes in the wounds or any new areas of concern Donnel Venuto [facility] MSN, RN,CWOCN, CNS, CWON-AP [phone]

## 2023-08-05 NOTE — Progress Notes (Signed)
 [facility] NEPHROLOGY PROGRESS NOTE   Subjective:  Seen in ICU.   HD yesterday later in the day, dialysis catheter not removed thereafter   Objective Vital signs in last 24 hours: Vitals:   07/26/23 1100 07/26/23 1125 07/26/23 1130 07/26/23 1200  BP: (!) 179/96 (!) 146/85 115/72 (!) 83/50  Pulse: (!) 105 96 93 76  Resp: 16 20 20 20   Temp:    99.8 F (37.7 C)  TempSrc:    Axillary  SpO2: 100% 98% 98% 99%  Weight:      Height:        Physical Exam: General: Critically ill looking male, on trach collar Heart:RRR, s1s2 nl Lungs: clear ant/ lat Abdomen:soft, non-tender, non-distended Extremities: no pitting edema, LUE distortion is broken bone not edema Neurology: responding minimally    Assessment/ Plan: Pt is a 58 y.o. yo male with past medical history significant for HTN, HLD, type II DM, BPH, seizure who was initially presented at [facility] due to AMS, admitted for sepsis due to pneumonia, encephalopathy, sepsis, AKI, hypercalcemia, symptomatic anemia and lytic lesion concern for multiple myeloma.  Problems # AKI on CKD 3a - b/l creat 1.1- 1.5 from 2024. Creat here 5.8 on presentation at OSH. AKI due to myeloma kidney +/- hyperCa +/- ACEi. Started CRRT on 4/23. Hemodynamics improved and CRRT dc'd 5/10. Is on iHD now. Doing MWF schedule here for now. Next HD 5/22 or 5/23 after line holiday.  Daily labs  # AMS/acute metabolic encephalopathy: MRI, LP completed and unrevealing.    #Volume - appears euvolemic.  Gentle UF moving forward  # Hypotension:Not on pressors with stable blood pressures, normotensive, resolved.  # Multiple myeloma: started on Velcade  and Cytoxan , #1 4/22 and #2 4/25. Labs showed good response, last checked 07/22/23. On hold for now given ongoing critical illness.  Hematology following  #AHRF/ aspergillus PNA/ s/p trach 5/8: on voriconazole  IV, off all other IV antibiotics. Off the ventilator on trach collar, per CCM  #  Atrial fib-  on amio  #Anemia - prn transfusions. On epogen  per onc.   Charletta Cons, MD  07/26/2023, 1:31 PM  Recent Labs  Lab 07/24/23 0421 07/24/23 1630 07/25/23 1508 07/26/23 0423  HGB 8.9*  --   --  8.9*  ALBUMIN  1.6*   < > 1.6* 1.8*  1.6*  CALCIUM  7.4*   < > 7.7* 7.7*  7.7*  PHOS 2.0*  1.5*   < > RESULTS UNAVAILABLE DUE TO INTERFERING SUBSTANCE 1.9*  1.5*  CREATININE 1.59*   < > 1.62* 1.77*  1.74*  K 4.3   < > 4.6 4.4  4.5   < > = values in this interval not displayed.    Inpatient medications:  amiodarone   200 mg Per NG tube Daily   arformoterol   15 mcg Nebulization BID   busPIRone   15 mg Per Tube QHS   clonazePAM   1 mg Per Tube BID   docusate  100 mg Per Tube BID   epoetin  alfa  40,000 Units Subcutaneous QODAY   famotidine   10.4 mg Per Tube Daily   feeding supplement (PROSource TF20)  60 mL Per Tube TID   folic acid   1 mg Per Tube Daily   gabapentin   100 mg Per Tube Q12H   heparin  injection (subcutaneous)  5,000 Units Subcutaneous Q8H   insulin  aspart  0-20 Units Subcutaneous Q4H   insulin  aspart  8 Units Subcutaneous Q4H   insulin  glargine-yfgn  10 Units Subcutaneous BID  lactulose   20 g Per Tube TID   leptospermum manuka honey  1 Application Topical Daily   lidocaine -EPINEPHrine   20 mL Intradermal Once   midodrine   10 mg Per Tube TID WC   nicotine   7 mg Transdermal Daily   mouth rinse  15 mL Mouth Rinse Q2H   PARoxetine   40 mg Per Tube Daily   pneumococcal 20-valent conjugate vaccine  0.5 mL Intramuscular Tomorrow-1000   polyethylene glycol  17 g Per Tube Daily   QUEtiapine   50 mg Per Tube QHS   revefenacin   175 mcg Nebulization Daily   sodium chloride  flush  10-40 mL Intracatheter Q12H   sodium chloride  HYPERTONIC  4 mL Nebulization Daily   thiamine   100 mg Per Tube Daily    ampicillin -sulbactam (UNASYN ) IV Stopped (07/26/23 1610)   dexmedetomidine  (PRECEDEX ) IV infusion 1.2 mcg/kg/hr (07/26/23 1300)   feeding supplement (VITAL 1.5 CAL) 60 mL/hr  at 07/26/23 1300   fentaNYL  infusion INTRAVENOUS 200 mcg/hr (07/26/23 1300)   levofloxacin  (LEVAQUIN ) IV 100 mL/hr at 07/26/23 1300   midazolam      norepinephrine  (LEVOPHED ) Adult infusion 2 mcg/min (07/26/23 1300)   sodium PHOSPHATE  IVPB (in mmol)     voriconazole  Stopped (07/26/23 1225)   Followed by   [name] ON 07/27/2023] voriconazole      acetaminophen  (TYLENOL ) oral liquid 160 mg/5 mL **OR** acetaminophen , fentaNYL , ipratropium-albuterol , midazolam , ondansetron  (ZOFRAN ) IV, mouth rinse, oxyCODONE , polyethylene glycol, polyvinyl alcohol , sodium chloride  flush

## 2023-08-05 NOTE — Plan of Care (Signed)
  Problem: Clinical Measurements: Goal: Ability to maintain clinical measurements within normal limits will improve Outcome: Progressing Goal: Will remain free from infection Outcome: Progressing   Problem: Education: Goal: Knowledge of General Education information will improve Description: Including pain rating scale, medication(s)/side effects and non-pharmacologic comfort measures Outcome: Not Progressing   Problem: Health Behavior/Discharge Planning: Goal: Ability to manage health-related needs will improve Outcome: Not Progressing   

## 2023-08-05 NOTE — Progress Notes (Signed)
 NAME:  Frank Paolini., MRN:  [id number], DOB:  1966/02/27, LOS: 33 ADMISSION DATE:  07/03/2023, CONSULTATION DATE:  07/06/23 REFERRING MD:  Dr Maury Space, CHIEF COMPLAINT:  Encephalopathy   History of Present Illness:   PCCM asked to see patient for encephalopathy   Transferred from [facility] with 1 week of generalized weakness, altered mentation, decreased appetite Recently being worked up for multiple myeloma.  Transferred to [facility] for oncology evaluation Nephrology consulted for AKI   Background history of obesity, asthma, obstructive sleep apnea on CPAP GERD, hypertension, hyperlipidemia Has been feeling weak for the last few days according to spouse Admitted with concern for sepsis, community-acquired pneumonia, antibiotic encephalopathy Workup revealed lytic bony lesions with concern for multiple myeloma Transfused with 3 units packed red blood cells Evaluation so far-bone marrow 07/04/2023  Pertinent  Medical History   Past Medical History:  Diagnosis Date   Anxiety    Arthritis    Asthma    Bipolar disorder (HCC)    Current every day smoker    Depression    Diabetes mellitus, type II (HCC)    Dyspnea    History of kidney stones    History of pneumonia    Hyperlipidemia    Hypertension    Morbid obesity (HCC)    Sedentary lifestyle    Seizures (HCC)    last seizure 12 yrs ago, no current problem   Sleep apnea    uses CPAP nightly   Significant Hospital Events: Including procedures, antibiotic start and stop dates in addition to other pertinent events   4/19-blood cultures, MRSA PCR negative 4/19 chest x-ray with mild pulmonary congestion 4/20 atrial fibrillation with RVR 4/21 nosebleed after IR NG attempt, NG placed by PCCM in afternoon, stable transferred to [facility] 4/22 intubated; some abla requiring prbcs; ct abd/pelvis showing acute groin hematoma tracking from posterior presumed from site of bone marrow biopsy; heparin  held EEG 4/25 >> moderate to severe  encephalopathy without any seizures or epileptiform discharges 4/23 respiratory culture >> MRSE 4/28 remains intubated on CRRT, down to one pressor  4/29 labile BP, NE back up. Responded a bit to fluids. Received Velcade  and Cytoxan  chemotherapy 5/1 remains encephalopathic, check CTH 5/2 LP ETT exchange  5/5 1 PRBC. Palliative care met w family  5/6 IVIG. plan for trach established. Added midodrine  after failed vaso wean. Abx/fungal changed to levaquin   5/7 weaning pressors incr midodrine  incr lactulose . Vaso off  5/8 Trach placed. Some post op bleeding improved with thrombi-pad 5/10 voriconazole  added back based on CT Chest findings, MRI brain with no acute changes 5/13 more alert and able to follow some commands today 5/19 and had been doing well on trach collar, and was on the verge of transferring to progressive when had a trach dislodgment.  Interim History / Subjective:   Tolerated trach collar overnight.  More responsive today.  Objective   Blood pressure 137/83, pulse (!) 105, temperature (!) 101.6 F (38.7 C), temperature source Axillary, resp. rate (!) 22, height 6' 0.01" (1.829 m), weight 99.5 kg, SpO2 94%.    FiO2 (%):  [40 %] 40 %   Intake/Output Summary (Last 24 hours) at 08/05/2023 1244 Last data filed at 08/05/2023 0929 Gross per 24 hour  Intake 1784.42 ml  Output 600.3 ml  Net 1184.12 ml   Filed Weights   08/04/23 1431 08/04/23 1841 08/05/23 0500  Weight: 103 kg 103 kg 99.5 kg   Gen:   In bed in no distress.  Obese. HEENT:  Tracheostomy in place. Lungs:   Clear to auscultation bilaterally. CV:       Heart sounds are unremarkable. Abd:   Abdomen is soft. Ext: No peripheral edema.  Swelling mid upper arm left side.  No pain on palpation. Skin:      Warm and dry; no rashes Neuro: Her open.  Response to pain.  Ancillary tests personally reviewed   Micro: 4/28 BCx > no growth, final 4/30 respiratory culture-few PMN, rare budding yeast, abundant GPC> few  aspergillus fumigatus Quantiferon 5/2 > neg Trach asp 5/4> rare GPC, rare mold  PCP smear trach asp 5/5 > neg  Trach asp 5/9 > abundant staph hemolyticus, few budding yeast Trach aspirate 5/15 - ngtd Blood cultures 5/11-no growth  CSF studies 1 RBC, 1 WBC, glucose 106 CSF culture-2 organisms acute, rare WBC> NGTD Cryptococcal antigen negative meningitis and encephalitis panel negative VDRL  non-reactive     Assessment & Plan:   Metabolic encephalopathy Seizure disorder Hyperammonemia Hypoactive delirium Acute hypoxemic respiratory failure Bilateral pleural effusions History of obstructive sleep apnea History of tobacco abuse Aspergillus pneumonia Acute kidney injury on chronic kidney disease stage IIIa Multiple myeloma Paroxysmal atrial fibrillation in the setting of acute illness, now back in sinus rhythm. Now in sinus rhythm Type 2 diabetes  Plan:  - Keep on trach collar. -Continue to minimize sedative agents.  Already more awake. - Continue hemodialysis as per nephrology. - Complete treatment for suspected Aspergillus pneumonia as per ID. - Line holiday as part of empiric fever workup. -Can resume chemotherapy thereafter. - Continue oral amiodarone . - X-ray left arm.  GOC: See IPAL from 5/7. Wife now updating goals to include 2-3 rounds of CPR maximum should he arrest. LTAC disposition pending, referral to [facility] has been made.  Appreciate palliative care involvement regarding goals of care  Best Practice (right click and "Reselect all SmartList Selections" daily)   Diet/type: tubefeeds  DVT prophylaxis prophylactic heparin   Pressure ulcer(s): identified on: 19/Apr/2025 GI prophylaxis: H2B Lines: Dialysis Catheter Foley:  na  Code Status:  full code Last date of multidisciplinary goals of care discussion d/w wife 5/7. See IPAL. Updated spouse at bedside 07/30/2023   Arlina Lair, MD [facility] ICU Physician [facility] [place] [facility]  Pager:  [phone] Mobile: [phone] After hours: [phone].

## 2023-08-05 NOTE — Progress Notes (Signed)
 He is still having temperature spikes.  So far, all cultures are negative.  As such, it is felt that this might be a paraneoplastic process from the myeloma.  I am a say that in 30 years, I cannot remember the patient with myeloma having a paraneoplastic febrile episode.  However, we will embark upon treatment.  He has responded so well after 1 cycle.  He maintained his blood counts pretty well.  Somehow, we will have to coordinate with his dialysis.  He is on hemodialysis right now.  I think he gets dialysis today.  I am unsure how responsive he really has been.  He is off pressors.  His labs show white cell count 11.1.  Hemoglobin 8.9.  Platelet count 330,000.  His last electrolytes were done on 08/03/2023.  This showed a BUN of 114 creatinine 4.9.  Calcium  8.7.  I sent off some additional myeloma studies.  They are not back yet.  Again, we will go ahead with treatment.  Again he responded incredibly well.  He I think he tolerated treatment pretty well.  His counts did go down a little bit.  We will just have to watch these counts.  If the fever truly is from myeloma, then I would think that the fever curves should flatten out a little bit.  Again, we will try to plan for treatment tomorrow.  [name], MD  Psalm 56:4

## 2023-08-05 NOTE — Progress Notes (Signed)
 Pharmacy Antibiotic Note  Frank [name]. is a 58 y.o. male admitted on 07/03/2023 with generalized weakness (multifactorial secondary to hypercalcemia, suspected malignancy, multiple myeloma).  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   A voriconazole  trough was drawn at steady state over the weekend and resulted as 1.9 (Goal trough 1-5.5)   Plan: - Switch voriconazole  to 350 mg BID per tube - Will continue to monitor voriconazole  levels weekly. Plan next trough Friday   Height: 6' 0.01" (182.9 cm) Weight: 99.5 kg (219 lb 5.7 oz) IBW/kg (Calculated) : 77.62  Temp (24hrs), Avg:100.3 F (37.9 C), Min:99 F (37.2 C), Max:101.9 F (38.8 C)  Recent Labs  Lab 07/30/23 0424 07/31/23 0509 08/01/23 0516 08/02/23 1006 08/03/23 0322 08/05/23 1154  WBC 14.9* 16.8* 17.3* 15.2* 11.1*  --   CREATININE 4.40* 3.87* 4.93* 4.13* 4.93* 4.08*    Estimated Creatinine Clearance: 23.8 mL/min (A) (by C-G formula based on SCr of 4.08 mg/dL (H)).    Allergies  Allergen Reactions   Morphine And Codeine Anaphylaxis   Shellfish Allergy Anaphylaxis   Betadine  [Povidone Iodine ] Itching   Bupropion Other (See Comments)    Shaking of the body, hallucinations    Chlorhexidine  Hives    Patient reports never had issues CHG with mouth rinse.   Influenza Vaccines Hives   Metoprolol  Rash     Thank you for allowing pharmacy to be a part of this patient's care.  Frank [name], PharmD, BCPS, BCIDP Infectious Diseases Clinical Pharmacist Phone: [phone] 08/05/2023 3:17 PM   **Pharmacist phone directory can now be found on [email or web address] ([email or web address]).  Listed under [facility].

## 2023-08-05 NOTE — TOC Progression Note (Addendum)
 Transition of Care (TOC) - Progression Note    Patient Details  Name: Frank [name]. MRN: [id number] Date of Birth: Jun 25, 1965  Transition of Care [facility]) CM/SW Contact  Juliane Och, LCSW Phone Number: 08/05/2023, 10:27 AM  Clinical Narrative:     10:27 AM Per [facility] Liaison, bed offer was rescinded. TOC consulted [facility] Liaison for possible bed offer.  11:38 AM Per [facility] liaison, no HD bed availability at this time and require patient to have [place] Medicaid upon [facility] discharge. Patient is a VA residence and ineligible for [facility] Medicaid. RNCM is to follow up on LTACH placement and insurance authorization.  Expected Discharge Plan: Long Term Acute Care (LTAC) Barriers to Discharge: Continued Medical Work up  Expected Discharge Plan and Services In-house Referral: Clinical Social Work Discharge Planning Services: CM Consult Post Acute Care Choice: Skilled Nursing Facility Living arrangements for the past 2 months: Single Family Home                 DME Arranged: N/A DME Agency: NA       HH Arranged: NA HH Agency: NA         Social Determinants of Health (SDOH) Interventions SDOH Screenings   Food Insecurity: No Food Insecurity (07/05/2023)  Housing: Low Risk  (07/05/2023)  Transportation Needs: No Transportation Needs (07/05/2023)  Utilities: Not At Risk (07/05/2023)  Tobacco Use: High Risk (07/10/2023)    Readmission Risk Interventions     No data to display

## 2023-08-05 NOTE — Progress Notes (Signed)
 SLP Cancellation Note  Patient Details Name: Frank [name]. MRN: [id number] DOB: [birth year]   Cancelled treatment:       Reason Eval/Treat Not Completed: Medical issues which prohibited therapy.  Responding minimally per notes and RN. Will f/u next date. If mentation continues will consider proceeding with PMV for secretions management despite not making attempts to verbally communicate.   Elleigh Cassetta MA, CCC-SLP    Arica Bevilacqua Meryl 08/05/2023, 11:21 AM

## 2023-08-05 NOTE — Progress Notes (Signed)
 [facility]  Date of Admission:  07/03/2023       Lines: 4/23-c right internal jugular hd cath tracheostomy  Abx: 5/4-6; 5/10-c vori  5/12-16 vanc 5/10-11 amp-sulb 5/6-13 levo  5/1-5/6 meropenem  4/28-5/6 vanc 4/23-5/1 piptazo 4/17-4/22 ceftriaxone  4/17-4/20 azith   ASSESSMENT: 58 yo male with left hip an knee arthroplasty, aggressive mm, left arm pathologic fracture, tobacco abuse/copd, marijuanna use, ckd3, admitted 07/08/23 in transfer for 1 week confusion found to have aki, acute on chronic anemia treated for MM complicated by neutropenia, fever, pna with aspergillus on sputum cx and progressive pulm opacity despite typical abx coverage  08/04/23 so far patient despite multiple bsAbx round and antifungal still have fever  Other comlications Aki on crrt Thighs hematoma  Micro: 5/15 resp cx normal flora 5/11 bcx negative 5/11 hep b sAb 162 5/10 asp ab negative 5/09 resp culture staph hemolyticus 5/05 bcx negative 5/05 mrsa nares pcr negative 5/04 sputum cx rare mold 5/02 lp cx negative 4/30 resp cx aspergillus 4/23 resp cx staph epi    #pna #aspergillus sputum cx S/p tx for HAP On vori for presumed aspergillus pna -- plan at least 3 months treatment  #fever #neutropenia had resolved by 07/22/23 in setting stopping chemo and use of G-csf  Ongoing fever ?paraneoplastic Fever through all abx/and voriconazole  Internal jugular hd line removed 5/20  Voriconazole  level high 1's  Next week 5/26 if still fevering could consider aspirating the thigh hematoma and send for culture    #mm #pathologic left arm fx Poor prognosis Ongoing fever so far negative ID workup -- will defer to onc and patient family regarding ongoing chemo   #resp failure S/p trach 5/08   #ams Question of intermittently following commands 5/02 lp no leukocytosis or abnormal chemistry; no cytology done   #thigh fluid collection Presumed  hematoma; question if infected   Principal Problem:   Generalized weakness Active Problems:   Multiple myeloma not having achieved remission (HCC)   Atrial fibrillation with rapid ventricular response (HCC)   AKI (acute kidney injury) (HCC)   Acute hypoxemic respiratory failure (HCC)   PAF (paroxysmal atrial fibrillation) (HCC)   Acute encephalopathy   Severe sepsis with septic shock (HCC)   On mechanically assisted ventilation (HCC)   Acute metabolic encephalopathy   Lobar pneumonia, unspecified organism (HCC)   Multiple myeloma without remission (HCC)   Protein-calorie malnutrition, severe   Malfunction of tracheostomy stoma (HCC)   Allergies  Allergen Reactions   Morphine And Codeine Anaphylaxis   Shellfish Allergy Anaphylaxis   Betadine  [Povidone Iodine ] Itching   Bupropion Other (See Comments)    Shaking of the body, hallucinations    Chlorhexidine  Hives    Patient reports never had issues CHG with mouth rinse.   Influenza Vaccines Hives   Metoprolol  Rash    Scheduled Meds:  arformoterol   15 mcg Nebulization BID   busPIRone   15 mg Per Tube QHS   collagenase   Topical Daily   feeding supplement (PROSource TF20)  60 mL Per Tube BID   folic acid   1 mg Per Tube Daily   gabapentin   200 mg Per Tube TID   heparin   2,500 Units Dialysis Once in dialysis   heparin  injection (subcutaneous)  5,000 Units Subcutaneous Q8H   insulin  aspart  0-20 Units Subcutaneous Q4H   insulin  glargine-yfgn  8 Units Subcutaneous BID   lactulose   10 g Per Tube BID   lidocaine -EPINEPHrine   20  mL Intradermal Once   multivitamin  1 tablet Per Tube QHS   nicotine   7 mg Transdermal Daily   nutrition supplement (JUVEN)  1 packet Per Tube BID BM   mouth rinse  15 mL Mouth Rinse Q2H   PARoxetine   40 mg Per Tube Daily   pneumococcal 20-valent conjugate vaccine  0.5 mL Intramuscular Tomorrow-1000   revefenacin   175 mcg Nebulization Daily   sodium chloride  flush  10-40 mL Intracatheter Q12H    thiamine   100 mg Per Tube Daily   voriconazole   350 mg Per Tube Q12H   Continuous Infusions:  anticoagulant sodium citrate      feeding supplement (VITAL 1.5 CAL) 65 mL/hr at 08/05/23 2100   sodium chloride      PRN Meds:.acetaminophen  (TYLENOL ) oral liquid 160 mg/5 mL **OR** acetaminophen , anticoagulant sodium citrate , clonazePAM , heparin , heparin , ipratropium-albuterol , lidocaine  (PF), lidocaine -prilocaine , ondansetron  (ZOFRAN ) IV, mouth rinse, oxyCODONE , pentafluoroprop-tetrafluoroeth, polyvinyl alcohol , sodium chloride  flush   SUBJECTIVE: Reviewed onc note, planning to start new chemo round Fever still   Review of Systems: ROS All other ROS was negative, except mentioned above     OBJECTIVE: Vitals:   08/05/23 1959 08/05/23 2000 08/05/23 2038 08/05/23 2100  BP: 96/76 96/76  109/70  Pulse: (!) 104 (!) 105 (!) 102 98  Resp: 16 16 20 19   Temp: (!) 100.8 F (38.2 C)     TempSrc: Axillary     SpO2: 97% 96% 93% 92%  Weight:      Height:       Body mass index is 29.74 kg/m.  Physical Exam General/constitutional: ill appearing HEENT: Normocephalic, PER, Conj Clear Neck: trach site no purulence CV: tachy no mrg Lungs: course bs Abd: Soft, Nontender Skin: No Rash Neuro: sedated/comatose MSK: no peripheral joint swelling/tenderness/warmth; right thigh hematoma warm to touch; left humerus fx site visualized grossly    Lab Results Lab Results  Component Value Date   WBC 11.1 (H) 08/03/2023   HGB 8.9 (L) 08/03/2023   HCT 29.5 (L) 08/03/2023   MCV 101.7 (H) 08/03/2023   PLT 330 08/03/2023    Lab Results  Component Value Date   CREATININE 4.08 (H) 08/05/2023   BUN 111 (H) 08/05/2023   NA 134 (L) 08/05/2023   K 3.9 08/05/2023   CL 98 08/05/2023   CO2 24 08/05/2023    Lab Results  Component Value Date   ALT 11 08/01/2023   AST 21 08/01/2023   ALKPHOS 79 08/01/2023   BILITOT 0.8 08/01/2023      Microbiology: Recent Results (from the past 240 hours)   Culture, blood (Routine X 2) w Reflex to ID Panel     Status: None   Collection Time: 07/27/23  5:30 PM   Specimen: BLOOD  Result Value Ref Range Status   Specimen Description BLOOD SITE NOT SPECIFIED  Final   Special Requests   Final    BOTTLES DRAWN AEROBIC AND ANAEROBIC Blood Culture results may not be optimal due to an inadequate volume of blood received in culture bottles   Culture   Final    NO GROWTH 5 DAYS Performed at [facility], [address], [place], [place] [address]    Report Status 08/01/2023 FINAL  Final  Culture, blood (Routine X 2) w Reflex to ID Panel     Status: None   Collection Time: 07/27/23  5:30 PM   Specimen: BLOOD  Result Value Ref Range Status   Specimen Description BLOOD SITE NOT SPECIFIED  Final  Special Requests   Final    BOTTLES DRAWN AEROBIC AND ANAEROBIC Blood Culture results may not be optimal due to an inadequate volume of blood received in culture bottles   Culture   Final    NO GROWTH 5 DAYS Performed at [facility], [address], [place], [place] [address]    Report Status 08/01/2023 FINAL  Final  Culture, Respiratory w Gram Stain     Status: None   Collection Time: 07/31/23  9:38 AM   Specimen: Tracheal Aspirate; Respiratory  Result Value Ref Range Status   Specimen Description TRACHEAL ASPIRATE  Final   Special Requests NONE  Final   Gram Stain   Final    RARE WBC PRESENT, PREDOMINANTLY PMN NO ORGANISMS SEEN    Culture   Final    FEW Normal respiratory flora-no Staph aureus or Pseudomonas seen Performed at [facility], [address], [place], [place] [address]    Report Status 08/02/2023 FINAL  Final     Serology:   Imaging: If present, new imagings (plain films, ct scans, and mri) have been personally visualized and interpreted; radiology reports have been reviewed. Decision making incorporated into the Impression / Recommendations.   Jamesetta Mcbride, MD [facility] [phone] pager    08/05/2023, 9:08 PM

## 2023-08-06 DIAGNOSIS — B449 Aspergillosis, unspecified: Secondary | ICD-10-CM

## 2023-08-06 DIAGNOSIS — J9601 Acute respiratory failure with hypoxia: Secondary | ICD-10-CM | POA: Diagnosis not present

## 2023-08-06 DIAGNOSIS — R509 Fever, unspecified: Secondary | ICD-10-CM | POA: Diagnosis not present

## 2023-08-06 DIAGNOSIS — J168 Pneumonia due to other specified infectious organisms: Secondary | ICD-10-CM | POA: Diagnosis not present

## 2023-08-06 DIAGNOSIS — B440 Invasive pulmonary aspergillosis: Secondary | ICD-10-CM | POA: Diagnosis not present

## 2023-08-06 DIAGNOSIS — N179 Acute kidney failure, unspecified: Secondary | ICD-10-CM | POA: Diagnosis not present

## 2023-08-06 DIAGNOSIS — J90 Pleural effusion, not elsewhere classified: Secondary | ICD-10-CM | POA: Diagnosis not present

## 2023-08-06 DIAGNOSIS — B441 Other pulmonary aspergillosis: Secondary | ICD-10-CM

## 2023-08-06 DIAGNOSIS — R531 Weakness: Secondary | ICD-10-CM | POA: Diagnosis not present

## 2023-08-06 DIAGNOSIS — G9341 Metabolic encephalopathy: Secondary | ICD-10-CM | POA: Diagnosis not present

## 2023-08-06 LAB — GLUCOSE, CAPILLARY
Glucose-Capillary: 172 mg/dL — ABNORMAL HIGH (ref 70–99)
Glucose-Capillary: 177 mg/dL — ABNORMAL HIGH (ref 70–99)
Glucose-Capillary: 186 mg/dL — ABNORMAL HIGH (ref 70–99)
Glucose-Capillary: 191 mg/dL — ABNORMAL HIGH (ref 70–99)
Glucose-Capillary: 192 mg/dL — ABNORMAL HIGH (ref 70–99)
Glucose-Capillary: 210 mg/dL — ABNORMAL HIGH (ref 70–99)
Glucose-Capillary: 228 mg/dL — ABNORMAL HIGH (ref 70–99)

## 2023-08-06 LAB — COMPREHENSIVE METABOLIC PANEL WITH GFR
ALT: 12 U/L (ref 0–44)
AST: 13 U/L — ABNORMAL LOW (ref 15–41)
Albumin: 2 g/dL — ABNORMAL LOW (ref 3.5–5.0)
Alkaline Phosphatase: 62 U/L (ref 38–126)
Anion gap: 16 — ABNORMAL HIGH (ref 5–15)
BUN: 136 mg/dL — ABNORMAL HIGH (ref 6–20)
CO2: 23 mmol/L (ref 22–32)
Calcium: 8.1 mg/dL — ABNORMAL LOW (ref 8.9–10.3)
Chloride: 98 mmol/L (ref 98–111)
Creatinine, Ser: 4.67 mg/dL — ABNORMAL HIGH (ref 0.61–1.24)
GFR, Estimated: 14 mL/min — ABNORMAL LOW (ref >60)
Glucose, Bld: 167 mg/dL — ABNORMAL HIGH (ref 70–99)
Potassium: 4.2 mmol/L (ref 3.5–5.1)
Sodium: 137 mmol/L (ref 135–145)
Total Bilirubin: 0.7 mg/dL (ref 0.0–1.2)
Total Protein: 8 g/dL (ref 6.5–8.1)

## 2023-08-06 LAB — CBC WITH DIFFERENTIAL/PLATELET
Abs Immature Granulocytes: 0.2 10*3/uL — ABNORMAL HIGH (ref 0.00–0.07)
Basophils Absolute: 0.1 10*3/uL (ref 0.0–0.1)
Basophils Relative: 1 %
Eosinophils Absolute: 0.2 10*3/uL (ref 0.0–0.5)
Eosinophils Relative: 1 %
HCT: 30.4 % — ABNORMAL LOW (ref 39.0–52.0)
Hemoglobin: 9.4 g/dL — ABNORMAL LOW (ref 13.0–17.0)
Immature Granulocytes: 2 %
Lymphocytes Relative: 17 %
Lymphs Abs: 1.8 10*3/uL (ref 0.7–4.0)
MCH: 31 pg (ref 26.0–34.0)
MCHC: 30.9 g/dL (ref 30.0–36.0)
MCV: 100.3 fL — ABNORMAL HIGH (ref 80.0–100.0)
Monocytes Absolute: 1.6 10*3/uL — ABNORMAL HIGH (ref 0.1–1.0)
Monocytes Relative: 15 %
Neutro Abs: 7 10*3/uL (ref 1.7–7.7)
Neutrophils Relative %: 64 %
Platelets: 306 10*3/uL (ref 150–400)
RBC: 3.03 MIL/uL — ABNORMAL LOW (ref 4.22–5.81)
RDW: 24.3 % — ABNORMAL HIGH (ref 11.5–15.5)
WBC: 10.8 10*3/uL — ABNORMAL HIGH (ref 4.0–10.5)
nRBC: 0.3 % — ABNORMAL HIGH (ref 0.0–0.2)

## 2023-08-06 LAB — RENAL FUNCTION PANEL
Albumin: 2 g/dL — ABNORMAL LOW (ref 3.5–5.0)
Anion gap: 17 — ABNORMAL HIGH (ref 5–15)
BUN: 137 mg/dL — ABNORMAL HIGH (ref 6–20)
CO2: 23 mmol/L (ref 22–32)
Calcium: 8.2 mg/dL — ABNORMAL LOW (ref 8.9–10.3)
Chloride: 99 mmol/L (ref 98–111)
Creatinine, Ser: 4.55 mg/dL — ABNORMAL HIGH (ref 0.61–1.24)
GFR, Estimated: 14 mL/min — ABNORMAL LOW (ref >60)
Glucose, Bld: 166 mg/dL — ABNORMAL HIGH (ref 70–99)
Phosphorus: 3.9 mg/dL (ref 2.5–4.6)
Potassium: 4.3 mmol/L (ref 3.5–5.1)
Sodium: 139 mmol/L (ref 135–145)

## 2023-08-06 LAB — MISC LABCORP TEST (SEND OUT)
Labcorp test code: 8474
Labcorp test code: 1024

## 2023-08-06 LAB — VITAMIN A: Vitamin A (Retinoic Acid): 42.5 ug/dL (ref 20.1–62.0)

## 2023-08-06 MED ORDER — PROCHLORPERAZINE EDISYLATE 10 MG/2ML IJ SOLN: 10.0000 mg | Freq: Four times a day (QID) | INTRAMUSCULAR | Status: AC | PRN

## 2023-08-06 MED ORDER — PALONOSETRON HCL INJECTION 0.25 MG/5ML
0.2500 mg | Freq: Once | INTRAVENOUS | Status: AC
Start: 2023-08-06 — End: 2023-08-06
  Administered 2023-08-06: 0.25 mg via INTRAVENOUS
  Filled 2023-08-06: qty 5

## 2023-08-06 MED ORDER — BORTEZOMIB CHEMO SQ INJECTION 3.5 MG (2.5MG/ML)
1.3000 mg/m2 | Freq: Once | INTRAMUSCULAR | Status: AC
  Administered 2023-08-06: 3 mg via SUBCUTANEOUS
  Filled 2023-08-06: qty 1.2

## 2023-08-06 MED ORDER — ONDANSETRON HCL 4 MG/2ML IJ SOLN
4.0000 mg | Freq: Four times a day (QID) | INTRAMUSCULAR | Status: DC | PRN
Start: 2023-08-09 — End: 2023-08-12

## 2023-08-06 MED ORDER — SODIUM CHLORIDE 0.9 % IV SOLN
900.0000 mg | Freq: Once | INTRAVENOUS | Status: AC
  Administered 2023-08-06: 900 mg via INTRAVENOUS
  Filled 2023-08-06: qty 45

## 2023-08-06 NOTE — Progress Notes (Signed)
 Frank [name] is still having temperatures.  I think the issue, I believe, is that he has 2 populations of myeloma cells.  I think what is causing his problems is that he has a line that is somewhat resilient already.  This is evidenced by the rising Kappa light chain.  Overall, his myeloma studies are gotten better. Monoclonal spike was down to 2.8 g/dL.  The IgG level was 4543 mg/dL.  However, the Kappa light chain was up significantly at 27.3 mg/dL.  Given this fact, we are going to have to treat him.  We will get treatment started today.  Will have to see how this works.  I know that he has other health issues.  I know he has a renal insufficiency.  He still does not have a lot of of cognitive functioning.  At least, his CBC looks much better.  His white count is 10.8.  Hemoglobin 9.4.  Platelet count 306,000.  His BUN is 137 creatinine 4.55.  Calcium  is 8.2 with an albumin  of 2.0.  As such, he may have an element of hypercalcemia but I do not think enough that we need to treat this.  His blood sugar is 166.  Sodium 139.  Potassium 4.3.  It is uncommon to see a single population of myeloma cells.  I suppose it is possible that he has had mutation given the fact that he does have adverse chromosomes.  It is certainly possible that we may have to adjust his protocol.  However, I think that given the Velcade /Cytoxan , this really should help with his.  What will really really help would be using the monoclonal antibodies that we have.  However, trying to do this as an inpatient has been very difficult.  I know that he is getting incredible care from everybody in the ICU.  I appreciate everybody's help.   Frank Cal, MD  [facility] Cower 41:10

## 2023-08-06 NOTE — Progress Notes (Signed)
 Chemo RN present to patient room. Patient verified using two separate identifiers. Consent verified for administration of chemo today. 24G PIV placed by chemo RN in left wrist, brisk blood return, flushed easily. Pre-medication administered and allowed 30 minutes to take effect. Velcade  and Cytoxan  administered. Patient tolerated well. No issues during administration. Primary RN aware of chemo precautions, to remain in place for 5 days.

## 2023-08-06 NOTE — Progress Notes (Signed)
 Patient has had weight loss since previous treatment. Cyclophosphamide  dose reduced to 900 mg (400 mg/m2) to reflect current weight per Dr. Birt Bulla instructions.

## 2023-08-06 NOTE — Progress Notes (Signed)
 [facility] NEPHROLOGY PROGRESS NOTE   Subjective:  Seen in ICU.  HD catheter removed yesterday BUN 136, K4.2, bicarbonate 23 Patient intermittently follows some commands Oncology note reviewed Case discussed with Dr. Marce Sensing Off pressors, on trach collar   Objective Vital signs in last 24 hours: Vitals:   07/26/23 1100 07/26/23 1125 07/26/23 1130 07/26/23 1200  BP: (!) 179/96 (!) 146/85 115/72 (!) 83/50  Pulse: (!) 105 96 93 76  Resp: 16 20 20 20   Temp:    99.8 F (37.7 C)  TempSrc:    Axillary  SpO2: 100% 98% 98% 99%  Weight:      Height:        Physical Exam: General: Critically ill looking male, on trach collar Heart:RRR, s1s2 nl Lungs: clear ant/ lat Abdomen:soft, non-tender, non-distended Extremities: no pitting edema, LUE distortion is broken bone not edema Neurology: responding minimally    Assessment/ Plan: Pt is a 58 y.o. yo male with past medical history significant for HTN, HLD, type II DM, BPH, seizure who was initially presented at [facility] due to AMS, admitted for sepsis due to pneumonia, encephalopathy, sepsis, AKI, hypercalcemia, symptomatic anemia and lytic lesion concern for multiple myeloma.  Problems # AKI on CKD 3a - b/l creat 1.1- 1.5 from 2024. Creat here 5.8 on presentation at OSH. AKI due to myeloma kidney +/- hyperCa +/- ACEi. Started CRRT on 4/23. Hemodynamics improved and CRRT dc'd 5/10. Is on iHD now. Doing MWF schedule here for now.  Currently with line holiday starting 5/20.  Tentative replacement of HD catheter 5/22.  Discussed with infectious diseases regarding temporary or tunneled catheter, will comment.  # AMS/acute metabolic encephalopathy: MRI, LP completed and unrevealing.  Waxes and wanes  #Volume - appears euvolemic.  Gentle UF moving forward  # Hypotension:Not on pressors with stable blood pressures, normotensive, resolved.  # Multiple myeloma: started on Velcade  and Cytoxan , #1 4/22 and #2 4/25.  Labs showed good response, last checked 07/22/23. On hold for now given ongoing critical illness.  Hematology following  #AHRF/ aspergillus PNA/ s/p trach 5/8: on voriconazole  IV, off all other IV antibiotics. Off the ventilator on trach collar, per CCM  # Atrial fib-  on amio  #Anemia - prn transfusions. On epogen  per onc.   # Persistent fevers with negative infectious workup thought to be related to malignancy  Charletta Cons, MD  07/26/2023, 1:31 PM  Recent Labs  Lab 07/24/23 0421 07/24/23 1630 07/25/23 1508 07/26/23 0423  HGB 8.9*  --   --  8.9*  ALBUMIN  1.6*   < > 1.6* 1.8*  1.6*  CALCIUM  7.4*   < > 7.7* 7.7*  7.7*  PHOS 2.0*  1.5*   < > RESULTS UNAVAILABLE DUE TO INTERFERING SUBSTANCE 1.9*  1.5*  CREATININE 1.59*   < > 1.62* 1.77*  1.74*  K 4.3   < > 4.6 4.4  4.5   < > = values in this interval not displayed.    Inpatient medications:  amiodarone   200 mg Per NG tube Daily   arformoterol   15 mcg Nebulization BID   busPIRone   15 mg Per Tube QHS   clonazePAM   1 mg Per Tube BID   docusate  100 mg Per Tube BID   epoetin  alfa  40,000 Units Subcutaneous QODAY   famotidine   10.4 mg Per Tube Daily   feeding supplement (PROSource TF20)  60 mL Per Tube TID   folic acid   1 mg Per Tube Daily  gabapentin   100 mg Per Tube Q12H   heparin  injection (subcutaneous)  5,000 Units Subcutaneous Q8H   insulin  aspart  0-20 Units Subcutaneous Q4H   insulin  aspart  8 Units Subcutaneous Q4H   insulin  glargine-yfgn  10 Units Subcutaneous BID   lactulose   20 g Per Tube TID   leptospermum manuka honey  1 Application Topical Daily   lidocaine -EPINEPHrine   20 mL Intradermal Once   midodrine   10 mg Per Tube TID WC   nicotine   7 mg Transdermal Daily   mouth rinse  15 mL Mouth Rinse Q2H   PARoxetine   40 mg Per Tube Daily   pneumococcal 20-valent conjugate vaccine  0.5 mL Intramuscular Tomorrow-1000   polyethylene glycol  17 g Per Tube Daily   QUEtiapine   50 mg Per Tube QHS   revefenacin    175 mcg Nebulization Daily   sodium chloride  flush  10-40 mL Intracatheter Q12H   sodium chloride  HYPERTONIC  4 mL Nebulization Daily   thiamine   100 mg Per Tube Daily    ampicillin -sulbactam (UNASYN ) IV Stopped (07/26/23 1610)   dexmedetomidine  (PRECEDEX ) IV infusion 1.2 mcg/kg/hr (07/26/23 1300)   feeding supplement (VITAL 1.5 CAL) 60 mL/hr at 07/26/23 1300   fentaNYL  infusion INTRAVENOUS 200 mcg/hr (07/26/23 1300)   levofloxacin  (LEVAQUIN ) IV 100 mL/hr at 07/26/23 1300   midazolam      norepinephrine  (LEVOPHED ) Adult infusion 2 mcg/min (07/26/23 1300)   sodium PHOSPHATE  IVPB (in mmol)     voriconazole  Stopped (07/26/23 1225)   Followed by   [name] ON 07/27/2023] voriconazole      acetaminophen  (TYLENOL ) oral liquid 160 mg/5 mL **OR** acetaminophen , fentaNYL , ipratropium-albuterol , midazolam , ondansetron  (ZOFRAN ) IV, mouth rinse, oxyCODONE , polyethylene glycol, polyvinyl alcohol , sodium chloride  flush

## 2023-08-06 NOTE — Evaluation (Signed)
 Clinical/Bedside Swallow Evaluation Patient Details  Name: Frank [name]. MRN: [id number] Date of Birth: [birth year]  Today's Date: 08/06/2023 Time: SLP Start Time (ACUTE ONLY): 1120 SLP Stop Time (ACUTE ONLY): 1150 SLP Time Calculation (min) (ACUTE ONLY): 30 min  Past Medical History:  Past Medical History:  Diagnosis Date   Anxiety    Arthritis    Asthma    Bipolar disorder (HCC)    Current every day smoker    Depression    Diabetes mellitus, type II (HCC)    Dyspnea    History of kidney stones    History of pneumonia    Hyperlipidemia    Hypertension    Morbid obesity (HCC)    Sedentary lifestyle    Seizures (HCC)    last seizure 12 yrs ago, no current problem   Sleep apnea    uses CPAP nightly   Past Surgical History:  Past Surgical History:  Procedure Laterality Date   CARDIAC CATHETERIZATION  2020   carpal tunel Left    COLONOSCOPY     HARDWARE REMOVAL Left 07/26/2022   Procedure: HARDWARE REMOVAL ELBOW;  Surgeon: Laneta Pintos, MD;  Location: [facility] OR;  Service: Orthopedics;  Laterality: Left;   ORIF HUMERUS FRACTURE Left 01/16/2022   Procedure: OPEN REDUCTION INTERNAL FIXATION (ORIF) DISTAL HUMERUS FRACTURE;  Surgeon: Laneta Pintos, MD;  Location: [facility] OR;  Service: Orthopedics;  Laterality: Left;   ORIF HUMERUS FRACTURE Left 01/29/2023   Procedure: OPEN REDUCTION INTERNAL FIXATION (ORIF) DISTAL HUMERUS FRACTURE;  Surgeon: Laneta Pintos, MD;  Location: [facility] OR;  Service: Orthopedics;  Laterality: Left;   OTHER SURGICAL HISTORY     R & L shoulder   OTHER SURGICAL HISTORY Right    Knee surgery x several   TOTAL HIP ARTHROPLASTY Left 12/26/2017   Procedure: LEFT TOTAL HIP ARTHROPLASTY ANTERIOR APPROACH;  Surgeon: Arnie Lao, MD;  Location: [facility] ORS;  Service: Orthopedics;  Laterality: Left;   TOTAL KNEE ARTHROPLASTY Left 11/23/2021   Procedure: LEFT TOTAL KNEE ARTHROPLASTY;  Surgeon: Arnie Lao, MD;  Location: [facility] ORS;  Service:  Orthopedics;  Laterality: Left;   HPI:  58 y.o. male transferred from [facility] 07/03/23 to [facility] for management of newly diagnosed plasma cell myeloma with weakness and AMS. Pt also with AKI and metabolic encephalopathy. 5/10 transfer to [facility] for iHD.Bedside swallow eval 4/18 wiht recs for NPO due to somnolence. 4/22 Intubated and chemo initiated. 4/23-5/10 CRRT. 5/6 IVIG started. 5/8 trach. 5/12 iHD initiated. 5/18 inability to oxygenate through trach, intubated and trach revision. PMHx:Lt THA, carpal tunnel syndrome, Lt TKA, Lt humerus fx, PAF, HTN, HLD, bipolar disorder, obesity, T2DM    Assessment / Plan / Recommendation  Clinical Impression  During PMSV assessment pt became fully awake, made a cup gesture to SLP. Pt confirmed he was asking for water . SLP proceeded with a brief swallow eval. Pt given oral care, pt also reached up and removed toothbrush from his mouth. SLP offered ice chips. Pt did not masticate, but allowed to melt, swallowed with some throat clearing. Attempted a taste of water  from cup edge, labial weakness too significant; pt could not seal lips to cup edge ruslting in significant anterior spillage. Will follow for PO readiness if mentation and strength continue to improve. SLP Visit Diagnosis: Aphonia (R49.1)    Aspiration Risk       Diet Recommendation NPO;Alternative means - temporary    Medication Administration: Via alternative means    Other  Recommendations  Recommendations for follow up therapy are one component of a multi-disciplinary discharge planning process, led by the attending physician.  Recommendations may be updated based on patient status, additional functional criteria and insurance authorization.  Follow up Recommendations SLP at Long-term acute care hospital      Assistance Recommended at Discharge    Functional Status Assessment Patient has had a recent decline in their functional status and demonstrates the ability to make significant  improvements in function in a reasonable and predictable amount of time.  Frequency and Duration min 2x/week          Prognosis        Swallow Study   General HPI: 58 y.o. male transferred from [facility] 07/03/23 to [facility] for management of newly diagnosed plasma cell myeloma with weakness and AMS. Pt also with AKI and metabolic encephalopathy. 5/10 transfer to [facility] for iHD.Bedside swallow eval 4/18 wiht recs for NPO due to somnolence. 4/22 Intubated and chemo initiated. 4/23-5/10 CRRT. 5/6 IVIG started. 5/8 trach. 5/12 iHD initiated. 5/18 inability to oxygenate through trach, intubated and trach revision. PMHx:Lt THA, carpal tunnel syndrome, Lt TKA, Lt humerus fx, PAF, HTN, HLD, bipolar disorder, obesity, T2DM Type of Study: Bedside Swallow Evaluation Previous Swallow Assessment: 07/04/23 - pt able to take sips, but too lethargic to eat, stayed lethargic for several days, ultimately intubated and SLP signed off Diet Prior to this Study: NPO;Cortrak/Small bore NG tube Temperature Spikes Noted: No Respiratory Status: Trach Collar History of Recent Intubation: No Behavior/Cognition: Alert;Cooperative Oral Cavity Assessment: Dry Oral Care Completed by SLP: Yes Oral Cavity - Dentition: Poor condition;Missing dentition Self-Feeding Abilities: Total assist Patient Positioning: Upright in bed Baseline Vocal Quality: Aphonic Volitional Cough: Strong Volitional Swallow: Unable to elicit    Oral/Motor/Sensory Function Overall Oral Motor/Sensory Function: Generalized oral weakness   Ice Chips Ice chips: Impaired Presentation: Spoon Pharyngeal Phase Impairments: Multiple swallows;Throat Clearing - Immediate   Thin Liquid Thin Liquid: Impaired Presentation: Cup Oral Phase Impairments: Reduced labial seal Oral Phase Functional Implications: Right anterior spillage Pharyngeal  Phase Impairments: Cough - Immediate    Nectar Thick Nectar Thick Liquid: Not tested   Honey Thick Honey Thick Liquid:  Not tested   Puree Puree: Not tested   Solid     Solid: Not tested      Imanni Burdine, Hardin Leys 08/06/2023,2:28 PM

## 2023-08-06 NOTE — Progress Notes (Signed)
 [facility]  Date of Admission:  07/03/2023       Lines: 4/23-c right internal jugular hd cath tracheostomy  Abx: 5/4-6; 5/10-c vori  5/12-16 vanc 5/10-11 amp-sulb 5/6-13 levo  5/1-5/6 meropenem  4/28-5/6 vanc 4/23-5/1 piptazo 4/17-4/22 ceftriaxone  4/17-4/20 azith   ASSESSMENT: 58 yo male with left hip an knee arthroplasty, aggressive mm, left arm pathologic fracture, tobacco abuse/copd, marijuanna use, ckd3, admitted 07/08/23 in transfer for 1 week confusion found to have aki, acute on chronic anemia treated for MM complicated by neutropenia, fever, pna with aspergillus on sputum cx and progressive pulm opacity despite typical abx coverage    Other comlications Aki on crrt Thighs hematoma  Micro: 5/15 resp cx normal flora 5/11 bcx negative 5/11 hep b sAb 162 5/10 asp ab negative 5/09 resp culture staph hemolyticus 5/05 bcx negative 5/05 mrsa nares pcr negative 5/04 sputum cx rare mold 5/02 lp cx negative 4/30 resp cx aspergillus 4/23 resp cx staph epi  5/21 line removed but still have fever. Needing dialysis. Mixed linaeage multiple myeloma kappa/lambda and dr Maria Shiner would like to restart chemo  #pna #aspergillus sputum cx S/p tx for HAP On vori for presumed aspergillus pna -- plan at least 3 months treatment  #fever #neutropenia had resolved by 07/22/23 in setting stopping chemo and use of G-csf  Ongoing fever ?paraneoplastic Fever through all abx/and voriconazole  Internal jugular hd line removed 5/20 but will replace for ongoing dialysis and doesn't appear to be line related  Voriconazole  level high 1's  Next week 5/26 if still fevering could consider aspirating the thigh hematoma and send for culture    #mm #pathologic left arm fx Poor prognosis Ongoing fever so far negative ID workup   onc to restart chemo   #resp failure S/p trach 5/08   #ams Question of intermittently following commands 5/02  lp no leukocytosis or abnormal chemistry; no cytology done   #thigh fluid collection Presumed hematoma; question if infected   Principal Problem:   Generalized weakness Active Problems:   Multiple myeloma not having achieved remission (HCC)   Atrial fibrillation with rapid ventricular response (HCC)   AKI (acute kidney injury) (HCC)   Acute hypoxemic respiratory failure (HCC)   PAF (paroxysmal atrial fibrillation) (HCC)   Acute encephalopathy   Severe sepsis with septic shock (HCC)   On mechanically assisted ventilation (HCC)   Acute metabolic encephalopathy   Lobar pneumonia, unspecified organism (HCC)   Multiple myeloma without remission (HCC)   Protein-calorie malnutrition, severe   Malfunction of tracheostomy stoma (HCC)   Fungal infection   Allergies  Allergen Reactions   Morphine And Codeine Anaphylaxis   Shellfish Allergy Anaphylaxis   Betadine  [Povidone Iodine ] Itching   Bupropion Other (See Comments)    Shaking of the body, hallucinations    Chlorhexidine  Hives    Patient reports never had issues CHG with mouth rinse.   Influenza Vaccines Hives   Metoprolol  Rash    Scheduled Meds:  arformoterol   15 mcg Nebulization BID   busPIRone   15 mg Per Tube QHS   collagenase   Topical Daily   feeding supplement (PROSource TF20)  60 mL Per Tube BID   folic acid   1 mg Per Tube Daily   heparin   2,500 Units Dialysis Once in dialysis   heparin  injection (subcutaneous)  5,000 Units Subcutaneous Q8H   insulin  aspart  0-20 Units Subcutaneous Q4H   insulin  glargine-yfgn  8 Units Subcutaneous BID  lactulose   10 g Per Tube BID   lidocaine -EPINEPHrine   20 mL Intradermal Once   multivitamin  1 tablet Per Tube QHS   nicotine   7 mg Transdermal Daily   nutrition supplement (JUVEN)  1 packet Per Tube BID BM   mouth rinse  15 mL Mouth Rinse Q2H   PARoxetine   40 mg Per Tube Daily   pneumococcal 20-valent conjugate vaccine  0.5 mL Intramuscular Tomorrow-1000   revefenacin   175 mcg  Nebulization Daily   sodium chloride  flush  10-40 mL Intracatheter Q12H   thiamine   100 mg Per Tube Daily   voriconazole   350 mg Per Tube Q12H   Continuous Infusions:  anticoagulant sodium citrate      feeding supplement (VITAL 1.5 CAL) 65 mL/hr at 08/06/23 1900   PRN Meds:.acetaminophen  (TYLENOL ) oral liquid 160 mg/5 mL **OR** acetaminophen , anticoagulant sodium citrate , clonazePAM , heparin , heparin , ipratropium-albuterol , lidocaine  (PF), lidocaine -prilocaine , [START ON 08/09/2023] ondansetron  (ZOFRAN ) IV, mouth rinse, oxyCODONE , pentafluoroprop-tetrafluoroeth, polyvinyl alcohol , prochlorperazine , sodium chloride  flush   SUBJECTIVE: Ongoing fever Patient alert and able to communicate by blinking No focal discomfort   Review of Systems: ROS All other ROS was negative, except mentioned above     OBJECTIVE: Vitals:   08/06/23 1600 08/06/23 1700 08/06/23 1800 08/06/23 1900  BP: 93/73 112/73 109/81 115/78  Pulse: 98 (!) 104 (!) 108 (!) 108  Resp: (!) 21 (!) 23 (!) 23 (!) 24  Temp:      TempSrc:      SpO2: 91% 97% 97% 90%  Weight:      Height:       Body mass index is 29.74 kg/m.  Physical Exam General/constitutional: ill appearing, blink eyes to command HEENT: Normocephalic, PER, Conj Clear, poor dentition Neck: trach site no purulence CV: tachy no mrg Lungs: course bs Abd: Soft, Nontender Skin: No Rash Neuro: alert; generalized weakness not much movement in extremities MSK: no peripheral joint swelling/tenderness/warmth; right thigh hematoma warm to touch; left humerus fx site visualized grossly    Lab Results Lab Results  Component Value Date   WBC 10.8 (H) 08/06/2023   HGB 9.4 (L) 08/06/2023   HCT 30.4 (L) 08/06/2023   MCV 100.3 (H) 08/06/2023   PLT 306 08/06/2023    Lab Results  Component Value Date   CREATININE 4.55 (H) 08/06/2023   BUN 137 (H) 08/06/2023   NA 139 08/06/2023   K 4.3 08/06/2023   CL 99 08/06/2023   CO2 23 08/06/2023    Lab Results   Component Value Date   ALT 12 08/06/2023   AST 13 (L) 08/06/2023   ALKPHOS 62 08/06/2023   BILITOT 0.7 08/06/2023      Microbiology: Recent Results (from the past 240 hours)  Culture, Respiratory w Gram Stain     Status: None   Collection Time: 07/31/23  9:38 AM   Specimen: Tracheal Aspirate; Respiratory  Result Value Ref Range Status   Specimen Description TRACHEAL ASPIRATE  Final   Special Requests NONE  Final   Gram Stain   Final    RARE WBC PRESENT, PREDOMINANTLY PMN NO ORGANISMS SEEN    Culture   Final    FEW Normal respiratory flora-no Staph aureus or Pseudomonas seen Performed at [facility], [address] [place], [place] [address]    Report Status 08/02/2023 FINAL  Final     Serology:   Imaging: If present, new imagings (plain films, ct scans, and mri) have been personally visualized and interpreted; radiology reports have been  reviewed. Decision making incorporated into the Impression / Recommendations.  5/20 left humerus xray Lateral plate and screw fixation of the distal humerus. Progressive decreased bone density of the distal humerus with loss of bone stock, as well as cortical margins medially. This is greater than typically seen with disuse and raises suspicion for infection or underlying lytic bone lesion. Clinical correlation is advised.  Jamesetta Mcbride, MD [facility] [phone] pager    08/06/2023, 7:54 PM

## 2023-08-06 NOTE — Progress Notes (Signed)
 NAME:  Frank Philbrook., MRN:  [id number], DOB:  Oct 24, 1965, LOS: 34 ADMISSION DATE:  07/03/2023, CONSULTATION DATE:  07/06/23 REFERRING MD:  Dr Maury Space, CHIEF COMPLAINT:  Encephalopathy   History of Present Illness:   PCCM asked to see patient for encephalopathy   Transferred from [facility] with 1 week of generalized weakness, altered mentation, decreased appetite Recently being worked up for multiple myeloma.  Transferred to [facility] for oncology evaluation Nephrology consulted for AKI   Background history of obesity, asthma, obstructive sleep apnea on CPAP GERD, hypertension, hyperlipidemia Has been feeling weak for the last few days according to spouse Admitted with concern for sepsis, community-acquired pneumonia, antibiotic encephalopathy Workup revealed lytic bony lesions with concern for multiple myeloma Transfused with 3 units packed red blood cells Evaluation so far-bone marrow 07/04/2023  Pertinent  Medical History   Past Medical History:  Diagnosis Date   Anxiety    Arthritis    Asthma    Bipolar disorder (HCC)    Current every day smoker    Depression    Diabetes mellitus, type II (HCC)    Dyspnea    History of kidney stones    History of pneumonia    Hyperlipidemia    Hypertension    Morbid obesity (HCC)    Sedentary lifestyle    Seizures (HCC)    last seizure 12 yrs ago, no current problem   Sleep apnea    uses CPAP nightly   Significant Hospital Events: Including procedures, antibiotic start and stop dates in addition to other pertinent events   4/19-blood cultures, MRSA PCR negative 4/19 chest x-ray with mild pulmonary congestion 4/20 atrial fibrillation with RVR 4/21 nosebleed after IR NG attempt, NG placed by PCCM in afternoon, stable transferred to [facility] 4/22 intubated; some abla requiring prbcs; ct abd/pelvis showing acute groin hematoma tracking from posterior presumed from site of bone marrow biopsy; heparin  held EEG 4/25 >> moderate to severe  encephalopathy without any seizures or epileptiform discharges 4/23 respiratory culture >> MRSE 4/28 remains intubated on CRRT, down to one pressor  4/29 labile BP, NE back up. Responded a bit to fluids. Received Velcade  and Cytoxan  chemotherapy 5/1 remains encephalopathic, check CTH 5/2 LP ETT exchange  5/5 1 PRBC. Palliative care met w family  5/6 IVIG. plan for trach established. Added midodrine  after failed vaso wean. Abx/fungal changed to levaquin   5/7 weaning pressors incr midodrine  incr lactulose . Vaso off  5/8 Trach placed. Some post op bleeding improved with thrombi-pad 5/10 voriconazole  added back based on CT Chest findings, MRI brain with no acute changes 5/13 more alert and able to follow some commands today 5/19 and had been doing well on trach collar, and was on the verge of transferring to progressive when had a trach dislodgment.  Interim History / Subjective:   Remains on trach collar.  Minimally more awake.  On line holiday.   Objective   Blood pressure 127/80, pulse (!) 109, temperature (!) 102.1 F (38.9 C), temperature source Axillary, resp. rate (!) 22, height 6' 0.01" (1.829 m), weight 99.5 kg, SpO2 95%.    FiO2 (%):  [30 %-40 %] 30 %   Intake/Output Summary (Last 24 hours) at 08/06/2023 1012 Last data filed at 08/06/2023 0700 Gross per 24 hour  Intake 1370 ml  Output 1040 ml  Net 330 ml   Filed Weights   08/04/23 1431 08/04/23 1841 08/05/23 0500  Weight: 103 kg 103 kg 99.5 kg   Gen:   In bed  in no distress.  Obese. HEENT: Tracheostomy in place. Lungs:   Clear to auscultation bilaterally. CV:       Heart sounds are unremarkable. Abd:   Abdomen is soft. Ext: No peripheral edema.  Swelling mid upper arm left side.  No pain on palpation. Skin:      Warm and dry; no rashes Neuro: Eyes open, minimal pain response.  Ancillary tests personally reviewed   Micro: 4/28 BCx > no growth, final 4/30 respiratory culture-few PMN, rare budding yeast, abundant GPC>  few aspergillus fumigatus Quantiferon 5/2 > neg Trach asp 5/4> rare GPC, rare mold  PCP smear trach asp 5/5 > neg  Trach asp 5/9 > abundant staph hemolyticus, few budding yeast Trach aspirate 5/15 - ngtd Blood cultures 5/11-no growth  CSF studies 1 RBC, 1 WBC, glucose 106 CSF culture-2 organisms acute, rare WBC> NGTD Cryptococcal antigen negative meningitis and encephalitis panel negative VDRL  non-reactive   XR left arm consistent with ORIF of pathologic fracture.   Assessment & Plan:   Metabolic encephalopathy Seizure disorder Hyperammonemia Hypoactive delirium Acute hypoxemic respiratory failure - status post tracheostomy. Bilateral pleural effusions History of obstructive sleep apnea History of tobacco abuse Aspergillus pneumonia Acute kidney injury on chronic kidney disease stage IIIa Multiple myeloma Paroxysmal atrial fibrillation in the setting of acute illness, now back in sinus rhythm. Now in sinus rhythm Type 2 diabetes  Plan:  - Keep on trach collar. - Continue to minimize sedative agents.  Slowly more awake.  Will take time given renal failure.  Have stopped gabapentin  as well.  - Continue hemodialysis as per nephrology. Will discuss PermCath placement with IR as fever doesn't appears to be due to bacterial infection likely to seed line.  - Complete treatment for suspected Aspergillus pneumonia as per ID. - Line holiday as part of empiric fever workup.  Leukocytosis has improved but continues to have fevers which are likely related to malignancy. - Resuming chemotherapy today.  - Continue oral amiodarone . - Can potentially transfer to PCU once more awake.  GOC: See IPAL from 5/7. Wife now updating goals to include 2-3 rounds of CPR maximum should he arrest. LTAC disposition pending, referral to select has been made.  Appreciate palliative care involvement regarding goals of care  Best Practice (right click and "Reselect all SmartList Selections" daily)    Diet/type: tubefeeds  DVT prophylaxis prophylactic heparin   Pressure ulcer(s): identified on: 19/Apr/2025 GI prophylaxis: H2B Lines: Dialysis Catheter Foley:  na  Code Status:  full code Last date of multidisciplinary goals of care discussion d/w wife 5/7. See IPAL. Updated spouse at bedside 07/30/2023   Arlina Lair, MD [facility] ICU Physician [facility]  Pager: [phone] Mobile: [phone] After hours: [phone].

## 2023-08-06 NOTE — Evaluation (Signed)
 [facility] Speaking Valve - Evaluation Patient Details  Name: Frank [name]. MRN: [id number] Date of Birth: [birth year]  Today's Date: 08/06/2023 Time: 1120-1150 SLP Time Calculation (min) (ACUTE ONLY): 30 min  Past Medical History:  Past Medical History:  Diagnosis Date   Anxiety    Arthritis    Asthma    Bipolar disorder (HCC)    Current every day smoker    Depression    Diabetes mellitus, type II (HCC)    Dyspnea    History of kidney stones    History of pneumonia    Hyperlipidemia    Hypertension    Morbid obesity (HCC)    Sedentary lifestyle    Seizures (HCC)    last seizure 12 yrs ago, no current problem   Sleep apnea    uses CPAP nightly   Past Surgical History:  Past Surgical History:  Procedure Laterality Date   CARDIAC CATHETERIZATION  2020   carpal tunel Left    COLONOSCOPY     HARDWARE REMOVAL Left 07/26/2022   Procedure: HARDWARE REMOVAL ELBOW;  Surgeon: Laneta Pintos, MD;  Location: [facility] OR;  Service: Orthopedics;  Laterality: Left;   ORIF HUMERUS FRACTURE Left 01/16/2022   Procedure: OPEN REDUCTION INTERNAL FIXATION (ORIF) DISTAL HUMERUS FRACTURE;  Surgeon: Laneta Pintos, MD;  Location: [facility] OR;  Service: Orthopedics;  Laterality: Left;   ORIF HUMERUS FRACTURE Left 01/29/2023   Procedure: OPEN REDUCTION INTERNAL FIXATION (ORIF) DISTAL HUMERUS FRACTURE;  Surgeon: Laneta Pintos, MD;  Location: [facility] OR;  Service: Orthopedics;  Laterality: Left;   OTHER SURGICAL HISTORY     R & L shoulder   OTHER SURGICAL HISTORY Right    Knee surgery x several   TOTAL HIP ARTHROPLASTY Left 12/26/2017   Procedure: LEFT TOTAL HIP ARTHROPLASTY ANTERIOR APPROACH;  Surgeon: Arnie Lao, MD;  Location: [facility] ORS;  Service: Orthopedics;  Laterality: Left;   TOTAL KNEE ARTHROPLASTY Left 11/23/2021   Procedure: LEFT TOTAL KNEE ARTHROPLASTY;  Surgeon: Arnie Lao, MD;  Location: [facility] ORS;  Service: Orthopedics;  Laterality: Left;   HPI:  58 y.o. male  transferred from [facility] 07/03/23 to [facility] for management of newly diagnosed plasma cell myeloma with weakness and AMS. Pt also with AKI and metabolic encephalopathy. 5/10 transfer to [facility] for iHD.Bedside swallow eval 4/18 wiht recs for NPO due to somnolence. 4/22 Intubated and chemo initiated. 4/23-5/10 CRRT. 5/6 IVIG started. 5/8 trach. 5/12 iHD initiated. 5/18 inability to oxygenate through trach, intubated and trach revision. PMHx:Lt THA, carpal tunnel syndrome, Lt TKA, Lt humerus fx, PAF, HTN, HLD, bipolar disorder, obesity, T2DM    Assessment / Plan / Recommendation  Clinical Impression  Pt demonstrates questionable tolerance of PMSV. Cuff fully deflated, pt minimally alert at beginning of session, not responsive to SLP. PMSV placed with stable vital signs, but after 1 minute of sustained placement, constant coughing blew off valve. Each time it was replaced pt coughed and valve blew off. Pts cough was dysphonic, suspect reduced airflow to upper airway. There was no back pressure when valve was removed periodically prior to coughing. Pt did become more alert as a result of coughing and was able to follow commands, completed oral motor assessment, allowed better repositioning, holding his own head upright. When valve replaced after more upright he was able to wear it for 5 minutes, but could not phonate despite effort. No signs of airtrapping. Pt should wear PMSV with SLP only given concern for intolerance. SLP Visit Diagnosis: Aphonia (  R49.1)    SLP Assessment  Patient needs continued Speech Lanaguage Pathology Services    Recommendations for follow up therapy are one component of a multi-disciplinary discharge planning process, led by the attending physician.  Recommendations may be updated based on patient status, additional functional criteria and insurance authorization.  Follow Up Recommendations  SLP at Long-term acute care hospital    Assistance Recommended at Discharge     Functional Status Assessment    Frequency and Duration min 2x/week  2 weeks    PMSV Trial PMSV was placed for: 5 minutes Able to redirect subglottic air through upper airway: Yes Able to Attain Phonation: No Voice Quality: Aphonic Able to Expectorate Secretions: Yes Level of Secretion Expectoration with PMSV: Tracheal Breath Support for Phonation: Severely decreased Intelligibility: Unable to assess (comment) Respirations During Trial: 20 SpO2 During Trial: 95 %   Tracheostomy Tube       Vent Dependency  Vent Dependent: No FiO2 (%): 30 %    Cuff Deflation Trial Tolerated Cuff Deflation: Yes Length of Time for Cuff Deflation Trial: baseline         Solly Derasmo, Hardin Leys 08/06/2023, 2:20 PM

## 2023-08-06 NOTE — Progress Notes (Signed)
 Nutrition Follow-up  DOCUMENTATION CODES:  Severe malnutrition in context of acute illness/injury  INTERVENTION:  Continue tube feeding via cortrak: Vital 1.5 at 65 ml/h (1560 ml per day) Prosource TF20 60 ml BID Provides 2500 kcal, 145 gm protein, 1192 ml free water  daily Continue Juven 1 packet BID via tube, each packet provides 95 calories, 2.5 grams of protein (collagen) + micronutrients to support wound healing Continue Renal MVI daily via tube  NUTRITION DIAGNOSIS:  Severe Malnutrition related to acute illness (prolonged illness, interuptions in feeding) as evidenced by moderate fat depletion, moderate muscle depletion, percent weight loss (9.6% x 1 month). - remains applicable  GOAL:  Patient will meet greater than or equal to 90% of their needs - met with TF  MONITOR:  Vent status, Labs, Weight trends, TF tolerance  REASON FOR ASSESSMENT:  Consult Assessment of nutrition requirement/status  ASSESSMENT:  58 y.o. male with PMH of obesity, asthma, OSA on CPAP, GERD, HTN, HLD who presented due to feeling weak with AMS for a few days. Admitted with concern for sepsis, community-acquired pneumonia, encephalopathy as well as concern for multiple myeloma.  4/17 Admit to [facility] 4/21 SLP eval - NPO; IR attempted Cortrak but unable to be placed; CCM MD successfully placed NGT 4/22 Concern for aspiration overnight; Intubated 4/23 - CRRT 5/8 - tracheostomy 5/10 - CRRT discontinued 5/11 - transferred to [facility] 5/12 - iHD initiated   Discussed with RN, pt tolerating tube feeds still. Currently does not have HD cath for line holiday, tentative plan for cath tomorrow.  On trach collar.  Stool output has improved since transitioning TF formula.  RD team consulted for wound healing. Pt currently on Juven for additional support with wound healing.   Admit weight: 110.6 kg (4/17) Current weight: 99.5 kg (5/20)  Intake/Output Summary (Last 24 hours) at 08/06/2023 1415 Last  data filed at 08/06/2023 1202 Gross per 24 hour  Intake 1981.19 ml  Output 790 ml  Net 1191.19 ml  Net IO Since Admission: 13,727.19 mL [08/06/23 1415] UOP 440 mL x 24 hours  Drains/Lines: Small bore tube, 10 Fr. FMS 600 mL x 24 hours Tracheostomy   Nutrition Related Medications: Folic acid , NovoLog  0-20 units q4h, Semglee  8 units BID, Lactulose  BID, Rena-vit, Thiamine   Labs: Sodium 139, Potassium 4.3, Phosphorus 3.9, Vitamin A 42.5 (WNL), Zinc  73 (WNL), CRP 1.5 (H) CBG: 150-228 mg/dL x 24 hrs   Diet Order:   Diet Order             Diet NPO time specified  Diet effective midnight                  EDUCATION NEEDS:  Not appropriate for education at this time  Skin: Skin Integrity Issues: Per WOC note on 08/05/23: Sacrum: Unstageable Pressure injury; 100% yellow Right buttock: Unstageable Pressure Injury: 100% yellow Left buttock: Unstageable Pressure injury: non viable tissue Right hip; 100% pink Left posterior thigh: Stage 3 full thickness; 50% yellow/50% pink Left posterior thigh: Stage 3 full thickness; 50% yellow/50% pink  Left posterior thigh Stage 3 Left posterior thigh Deep Tissue Pressure Injury; small 3 areas of dark purple non blanchable tissue  Last BM:  5/20 FMS placed on 4/20  Height:  Ht Readings from Last 1 Encounters:  07/22/23 6' 0.01" (1.829 m)   Weight:  Wt Readings from Last 1 Encounters:  08/05/23 99.5 kg   Ideal Body Weight:  80.9 kg  BMI:  Body mass index is 29.74 kg/m.  Estimated Nutritional Needs:  Kcal:  2400-2700 kcal/d Protein:  130-150g/d Fluid:  1L+UOP   Doneta Furbish RD, LDN Clinical Dietitian

## 2023-08-07 ENCOUNTER — Inpatient Hospital Stay (HOSPITAL_COMMUNITY)

## 2023-08-07 DIAGNOSIS — J90 Pleural effusion, not elsewhere classified: Secondary | ICD-10-CM | POA: Diagnosis not present

## 2023-08-07 DIAGNOSIS — R531 Weakness: Secondary | ICD-10-CM | POA: Diagnosis not present

## 2023-08-07 DIAGNOSIS — B440 Invasive pulmonary aspergillosis: Secondary | ICD-10-CM | POA: Diagnosis not present

## 2023-08-07 DIAGNOSIS — G9341 Metabolic encephalopathy: Secondary | ICD-10-CM | POA: Diagnosis not present

## 2023-08-07 DIAGNOSIS — J9601 Acute respiratory failure with hypoxia: Secondary | ICD-10-CM | POA: Diagnosis not present

## 2023-08-07 HISTORY — PX: IR FLUORO GUIDE CV LINE RIGHT: IMG2283

## 2023-08-07 HISTORY — PX: IR US GUIDE VASC ACCESS RIGHT: IMG2390

## 2023-08-07 LAB — GLUCOSE, CAPILLARY
Glucose-Capillary: 201 mg/dL — ABNORMAL HIGH (ref 70–99)
Glucose-Capillary: 220 mg/dL — ABNORMAL HIGH (ref 70–99)
Glucose-Capillary: 190 mg/dL — ABNORMAL HIGH (ref 70–99)
Glucose-Capillary: 168 mg/dL — ABNORMAL HIGH (ref 70–99)
Glucose-Capillary: 180 mg/dL — ABNORMAL HIGH (ref 70–99)

## 2023-08-07 LAB — RENAL FUNCTION PANEL
Albumin: 1.9 g/dL — ABNORMAL LOW (ref 3.5–5.0)
Anion gap: 13 (ref 5–15)
BUN: 172 mg/dL — ABNORMAL HIGH (ref 6–20)
CO2: 22 mmol/L (ref 22–32)
Calcium: 7.9 mg/dL — ABNORMAL LOW (ref 8.9–10.3)
Chloride: 101 mmol/L (ref 98–111)
Creatinine, Ser: 4.9 mg/dL — ABNORMAL HIGH (ref 0.61–1.24)
GFR, Estimated: 13 mL/min — ABNORMAL LOW (ref >60)
Glucose, Bld: 199 mg/dL — ABNORMAL HIGH (ref 70–99)
Phosphorus: 4.5 mg/dL (ref 2.5–4.6)
Potassium: 4.6 mmol/L (ref 3.5–5.1)
Sodium: 136 mmol/L (ref 135–145)

## 2023-08-07 LAB — MAGNESIUM: Magnesium: 2.7 mg/dL — ABNORMAL HIGH (ref 1.7–2.4)

## 2023-08-07 MED ORDER — CEFAZOLIN SODIUM-DEXTROSE 2-4 GM/100ML-% IV SOLN
INTRAVENOUS | Status: AC | PRN
  Administered 2023-08-07: 2 g via INTRAVENOUS

## 2023-08-07 MED ORDER — FENTANYL CITRATE (PF) 100 MCG/2ML IJ SOLN
INTRAMUSCULAR | Status: AC | PRN
  Administered 2023-08-07: 25 ug via INTRAVENOUS

## 2023-08-07 MED ORDER — LIDOCAINE-EPINEPHRINE 1 %-1:100000 IJ SOLN
INTRAMUSCULAR | Status: AC
  Filled 2023-08-07: qty 1

## 2023-08-07 MED ORDER — FENTANYL CITRATE (PF) 100 MCG/2ML IJ SOLN
INTRAMUSCULAR | Status: AC
Start: 2023-08-07 — End: ?
  Filled 2023-08-07: qty 2

## 2023-08-07 MED ORDER — INSULIN ASPART 100 UNIT/ML IJ SOLN
4.0000 [IU] | INTRAMUSCULAR | Status: DC
  Administered 2023-08-07 – 2023-08-16 (×47): 4 [IU] via SUBCUTANEOUS

## 2023-08-07 MED ORDER — HEPARIN SODIUM (PORCINE) 1000 UNIT/ML IJ SOLN
4000.0000 [IU] | INTRAMUSCULAR | Status: AC
Start: 2023-08-07 — End: 2023-08-08

## 2023-08-07 MED ORDER — SODIUM CHLORIDE 0.9% FLUSH
10.0000 mL | Freq: Two times a day (BID) | INTRAVENOUS | Status: DC
Start: 2023-08-07 — End: 2023-10-16
  Administered 2023-08-07 – 2023-09-13 (×66): 10 mL
  Administered 2023-09-14: 20 mL
  Administered 2023-09-14 – 2023-09-15 (×2): 10 mL
  Administered 2023-09-15: 30 mL
  Administered 2023-09-16 – 2023-10-15 (×57): 10 mL

## 2023-08-07 MED ORDER — CEFAZOLIN SODIUM-DEXTROSE 2-4 GM/100ML-% IV SOLN
INTRAVENOUS | Status: AC
Start: 2023-08-07 — End: ?
  Filled 2023-08-07: qty 100

## 2023-08-07 MED ORDER — SODIUM CHLORIDE 0.9% FLUSH: 10.0000 mL | INTRAVENOUS | Status: DC | PRN

## 2023-08-07 MED ORDER — HEPARIN SODIUM (PORCINE) 1000 UNIT/ML IJ SOLN
INTRAMUSCULAR | Status: AC
  Filled 2023-08-07: qty 10

## 2023-08-07 MED ORDER — MIDAZOLAM HCL 2 MG/2ML IJ SOLN
INTRAMUSCULAR | Status: AC
  Filled 2023-08-07: qty 2

## 2023-08-07 MED ORDER — MIDAZOLAM HCL 2 MG/2ML IJ SOLN
INTRAMUSCULAR | Status: AC | PRN
  Administered 2023-08-07 (×2): .5 mg via INTRAVENOUS

## 2023-08-07 MED ORDER — LIDOCAINE HCL 1 % IJ SOLN
20.0000 mL | INTRAMUSCULAR | Status: AC
  Filled 2023-08-07: qty 20

## 2023-08-07 NOTE — Progress Notes (Signed)
 Mr. Quesinberry clearly is more alert today.  I am happy about this.  Hopefully, he started to come around a little bit better now.  He is making some urine.  He got chemotherapy yesterday.  I think we have to try to keep him on schedule right now.  His labs a show BUN of 172 creatinine 4.9.  Again he has dialysis.  His calcium  7.9 with an albumin  of 1.9.  His CBC is not back yet.  Again, it appears that he has a diverse population of myeloma cells.  I think the problem is that he has a set of myeloma cells that are light chain restricted that certainly are active in my opinion.  I think this is why we need to move ahead with treatment to see if we cannot get this down.  Clearly, we may need to adjust his protocol if needed.  I realize that it can be sometimes difficult to administer certain agents in the hospital.  However, in situations like Mr. Mester, we may have to understand that there are always exceptions to the role.  I am just happy that he seems to be doing better cognitively.  He has a trach in.  He is still having some temperatures.  Tmax yesterday was 102.1.  No interested to see how his temperature curve trends with the chemotherapy.  On his exam, his lungs sound relatively clear.  Cardiac exam is slightly tachycardic but regular.  Abdomen is soft.  Bowel sounds are present.  He has no fluid wave.  Extremities shows a little bit of edema.  Again, Mr. Montz has what I would probably consider a higher risk myeloma given his cytogenetics.  We have back on treatment.  We will have to see how his blood counts trend.  We will have to see how his temperature curve trends.  I do appreciate all the great care he is getting from everybody in the ICU.    [name], MD  [name] 1:37

## 2023-08-07 NOTE — Consult Note (Signed)
 Chief Complaint: AKI,  multiple myeloma, AMS - IR consulted for tunneled hemodialysis catheter placement  Referring Provider(s): Charletta Cons, MD   Supervising Physician: Creasie Doctor  Patient Status: [facility] - In-pt  History of Present Illness: Frank Ozment. is a 58 y.o. male with recently diagnosed multiple myeloma in April 2025. Initially had presented to [facility] with generalized weakness and AMS. Pt was found to have AKI and multiple lytic lesions concerning for multiple myeloma, with this later being confirmed with biopsy. Pt was since transferred to [facility] on 07/03/23 and has been in the hospital with AMS, intubated and later with tracheostomy placement. Has had fluctuating fevers that oncology and medicine team have attributed to underlying myeloma. Has had worsening AKI with need for HD, and IR has now been consulted for tunneled HD catheter placement.   Patient is Full Code  Past Medical History:  Diagnosis Date   Anxiety    Arthritis    Asthma    Bipolar disorder (HCC)    Current every day smoker    Depression    Diabetes mellitus, type II (HCC)    Dyspnea    History of kidney stones    History of pneumonia    Hyperlipidemia    Hypertension    Morbid obesity (HCC)    Sedentary lifestyle    Seizures (HCC)    last seizure 12 yrs ago, no current problem   Sleep apnea    uses CPAP nightly    Past Surgical History:  Procedure Laterality Date   CARDIAC CATHETERIZATION  2020   carpal tunel Left    COLONOSCOPY     HARDWARE REMOVAL Left 07/26/2022   Procedure: HARDWARE REMOVAL ELBOW;  Surgeon: Laneta Pintos, MD;  Location: [facility] OR;  Service: Orthopedics;  Laterality: Left;   ORIF HUMERUS FRACTURE Left 01/16/2022   Procedure: OPEN REDUCTION INTERNAL FIXATION (ORIF) DISTAL HUMERUS FRACTURE;  Surgeon: Laneta Pintos, MD;  Location: [facility] OR;  Service: Orthopedics;  Laterality: Left;   ORIF HUMERUS FRACTURE Left 01/29/2023   Procedure: OPEN REDUCTION  INTERNAL FIXATION (ORIF) DISTAL HUMERUS FRACTURE;  Surgeon: Laneta Pintos, MD;  Location: [facility] OR;  Service: Orthopedics;  Laterality: Left;   OTHER SURGICAL HISTORY     R & L shoulder   OTHER SURGICAL HISTORY Right    Knee surgery x several   TOTAL HIP ARTHROPLASTY Left 12/26/2017   Procedure: LEFT TOTAL HIP ARTHROPLASTY ANTERIOR APPROACH;  Surgeon: Arnie Lao, MD;  Location: [facility] ORS;  Service: Orthopedics;  Laterality: Left;   TOTAL KNEE ARTHROPLASTY Left 11/23/2021   Procedure: LEFT TOTAL KNEE ARTHROPLASTY;  Surgeon: Arnie Lao, MD;  Location: [facility] ORS;  Service: Orthopedics;  Laterality: Left;    Allergies: Morphine and codeine, Shellfish allergy, Betadine  [povidone iodine ], Bupropion, Chlorhexidine , Influenza vaccines, and Metoprolol   Medications: Prior to Admission medications   Medication Sig Start Date End Date Taking? Authorizing Provider  albuterol  (PROVENTIL  HFA;VENTOLIN  HFA) 108 (90 Base) MCG/ACT inhaler Inhale 2 puffs into the lungs every 6 (six) hours as needed for wheezing or shortness of breath.   Yes [provider]  albuterol  (PROVENTIL ) (2.5 MG/3ML) 0.083% nebulizer solution 2.5 mg every 2 (two) hours as needed for wheezing or shortness of breath.   Yes [provider]  aspirin  EC 81 MG tablet Take 81 mg by mouth daily. Swallow whole.   Yes [provider]  busPIRone  (BUSPAR ) 15 MG tablet Take 15 mg by mouth at bedtime.  Yes [provider]  celecoxib  (CELEBREX ) 200 MG capsule Take 200 mg by mouth daily.   Yes [provider]  clonazePAM  (KLONOPIN ) 1 MG tablet Take 1 mg by mouth once as needed for anxiety.   Yes [provider]  cyclobenzaprine  (FLEXERIL ) 5 MG tablet Take 1 tablet (5 mg total) by mouth 3 (three) times daily as needed for muscle spasms. Patient taking differently: Take 5 mg by mouth daily as needed for muscle spasms. 01/30/23  Yes Versie Gores, PA-C  divalproex  (DEPAKOTE ) 500  MG DR tablet Take 500 mg by mouth 2 (two) times daily.   Yes [provider]  EPINEPHrine  0.3 mg/0.3 mL IJ SOAJ injection Inject 0.3 mg into the muscle as needed for anaphylaxis. 08/28/21  Yes [provider]  esomeprazole (NEXIUM) 40 MG capsule Take 40 mg by mouth in the morning.   Yes [provider]  Fluticasone -Salmeterol (ADVAIR) 500-50 MCG/DOSE AEPB Inhale 2 puffs into the lungs in the morning.   Yes [provider]  furosemide  (LASIX ) 40 MG tablet Take 40 mg by mouth daily as needed for fluid or edema.   Yes [provider]  gabapentin  (NEURONTIN ) 300 MG capsule Take 300-600 mg by mouth See admin instructions. 300 mg in the morning, 600 mg at bedtime   Yes [provider]  hydrALAZINE  (APRESOLINE ) 10 MG tablet Take 10 mg by mouth in the morning and at bedtime.   Yes [provider]  hydrochlorothiazide  (HYDRODIURIL ) 25 MG tablet Take 25 mg by mouth daily.   Yes [provider]  lisinopril  (ZESTRIL ) 40 MG tablet Take 40 mg by mouth daily. 03/25/23  Yes [provider]  metFORMIN  (GLUCOPHAGE ) 1000 MG tablet Take 1,000 mg by mouth 2 (two) times daily with a meal.   Yes [provider]  montelukast  (SINGULAIR ) 10 MG tablet Take 10 mg by mouth at bedtime.   Yes [provider]  OVER THE COUNTER MEDICATION Take 400 mg by mouth at bedtime. Burdock root   Yes [provider]  oxyCODONE  (ROXICODONE ) 5 MG immediate release tablet Take 1 tablet (5 mg total) by mouth every 4 (four) hours as needed for severe pain (pain score 7-10). 01/30/23  Yes Versie Gores, PA-C  PARoxetine  (PAXIL ) 40 MG tablet Take 40 mg by mouth every morning.   Yes [provider]  spironolactone  (ALDACTONE ) 25 MG tablet Take 25 mg by mouth at bedtime. 10/07/21  Yes [provider]  tamsulosin  (FLOMAX ) 0.4 MG CAPS capsule Take 0.4 mg by mouth at bedtime. 10/07/21  Yes [provider]  testosterone   cypionate (DEPOTESTOSTERONE CYPIONATE) 200 MG/ML injection Inject 0.5 mLs (100 mg total) into the muscle every 14 (fourteen) days. 09/17/16  Yes Nida, Gebreselassie W, MD  verapamil  (CALAN -SR) 120 MG CR tablet Take 120 mg by mouth daily.   Yes [provider]  Vitamin D , Ergocalciferol , (DRISDOL ) 1.25 MG (50000 UNIT) CAPS capsule Take 1 capsule (50,000 Units total) by mouth every Thursday. Patient taking differently: Take 50,000 Units by mouth once a week. Tuesday 02/06/23  Yes Versie Gores, PA-C  buPROPion (WELLBUTRIN XL) 150 MG 24 hr tablet Take 150 mg by mouth daily. Patient not taking: Reported on 07/04/2023    [provider]     Family History  Problem Relation Age of Onset   Diabetes Mother    Hypertension Mother    Heart attack Mother    Osteoporosis Mother    Diabetes Father    Hypertension  Father    Heart attack Father    Diabetes Sister    Hypertension Sister    Diabetes Brother    Hypertension Brother     Social History   Socioeconomic History   Marital status: Married    Spouse name: Not on file   Number of children: Not on file   Years of education: Not on file   Highest education level: Not on file  Occupational History   Not on file  Tobacco Use   Smoking status: Every Day    Current packs/day: 0.50    Average packs/day: 0.5 packs/day for 25.0 years (12.5 ttl pk-yrs)    Types: Cigarettes   Smokeless tobacco: Never  Vaping Use   Vaping status: Never Used  Substance and Sexual Activity   Alcohol  use: No   Drug use: Yes    Types: Marijuana    Comment: 01/28/2023   Sexual activity: Yes  Other Topics Concern   Not on file  Social History Narrative   Not on file   Social Drivers of Health   Financial Resource Strain: Not on file  Food Insecurity: No Food Insecurity (07/05/2023)   Hunger Vital Sign    Worried About Running Out of Food in the Last Year: Never true    Ran Out of Food in the Last Year: Never true  Transportation  Needs: No Transportation Needs (07/05/2023)   PRAPARE - Administrator, Civil Service (Medical): No    Lack of Transportation (Non-Medical): No  Physical Activity: Not on file  Stress: Not on file  Social Connections: Not on file     Review of Systems: A 12 point ROS discussed and pertinent positives are indicated in the HPI above.  All other systems are negative.  Review of Systems  Unable to perform ROS: Acuity of condition    Vital Signs: BP (!) 136/92   Pulse (!) 110   Temp 98.4 F (36.9 C) (Oral)   Resp (!) 23   Ht 6' 0.01" (1.829 m)   Wt 219 lb 5.7 oz (99.5 kg)   SpO2 100%   BMI 29.74 kg/m   Advance Care Plan: No documents on file   Physical Exam Constitutional:      Comments: Exam limited d/t patients condition and AMS  Pulmonary:     Comments: Trach noted. Clear breath sounds bilaterally. Abdominal:     Palpations: Abdomen is soft.  Musculoskeletal:     Right lower leg: No edema.     Left lower leg: No edema.  Skin:    General: Skin is warm and dry.  Neurological:     Mental Status: He is disoriented.     Comments: Will vaguely respond to voice commands. Largely obtunded. Was able to partially give thumb up today when asked. Otherwise not alert or oriented.     Imaging: DG Humerus Left Result Date: 08/05/2023 CLINICAL DATA:  Fracture follow-up. EXAM: LEFT HUMERUS - 2+ VIEW COMPARISON:  Elbow radiograph 01/29/2023 FINDINGS: Lateral plate and screw fixation of the distal humerus. Progressive decreased bone density of the distal humerus involving the distal 1/3 with loss of bone stock, as well as also loss of cortical margins medially. Remote distal humeral fracture with nonunion. Soft tissue edema seen laterally. The proximal humerus is not entirely included in the field of view, however diffusely decreased bone density is seen proximally. IMPRESSION: Lateral plate and screw fixation of the distal humerus. Progressive decreased bone density of the  distal humerus with  loss of bone stock, as well as cortical margins medially. This is greater than typically seen with disuse and raises suspicion for infection or underlying lytic bone lesion. Clinical correlation is advised. Electronically Signed   By: Chadwick Colonel M.D.   On: 08/05/2023 13:04   DG Chest Port 1 View Result Date: 08/03/2023 CLINICAL DATA:  Status post tracheostomy. EXAM: PORTABLE CHEST 1 VIEW COMPARISON:  Radiograph 07/26/2023 FINDINGS: Tracheostomy tube tip projects over the thoracic inlet. Weighted enteric tube tip below the diaphragm not included in the field of view. Right internal jugular central line tip projects over the brachiocephalic/SVC confluence. Development of subcutaneous emphysema about both supraclavicular regions and lateral chest walls. There is pneumomediastinum. No convincing pneumothorax. Stable heart size and mediastinal contours. Improvement in patchy bilateral lung opacities, greatest residual on the left. IMPRESSION: 1. Tracheostomy tube tip projects over the thoracic inlet. 2. Development of subcutaneous emphysema about both supraclavicular regions and lateral chest walls. Pneumomediastinum. No convincing pneumothorax. 3. Improvement in patchy bilateral lung opacities, greatest residual on the left. Electronically Signed   By: Chadwick Colonel M.D.   On: 08/03/2023 16:33   CT FEMUR RIGHT W CONTRAST Result Date: 08/01/2023 CLINICAL DATA:  Right thigh hematoma EXAM: CT OF THE LOWER RIGHT EXTREMITY WITH CONTRAST TECHNIQUE: Multidetector CT imaging of the lower right extremity was performed according to the standard protocol following intravenous contrast administration. RADIATION DOSE REDUCTION: This exam was performed according to the departmental dose-optimization program which includes automated exposure control, adjustment of the mA and/or kV according to patient size and/or use of iterative reconstruction technique. CONTRAST:  75mL OMNIPAQUE  IOHEXOL  350 MG/ML  SOLN COMPARISON:  None Available. FINDINGS: Bones/Joint/Cartilage Degenerative changes seen at the lumbosacral junction. Left total hip arthroplasty partially visualized. 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 No acute fracture or dislocation. Ligaments Suboptimally assessed by CT. Muscles and Tendons There is marked fatty atrophy of the semimembranosus and biceps femoris as well as the rectus femoris. Milder atrophy involving the vastus intermedius and vastus lateralis. Iliopsoas, gluteal, hamstring, quadriceps, patellar tendons appear intact. Soft tissues Ovoid hyperdense collection is seen within the subcutaneous soft tissues of the anterior right thigh measuring 12.1 x 30.2 x 7.6 cm in greatest dimension compatible with a subcutaneous hematoma. No active extravasation identified. The collection communicates superiorly with a second similar appearing collection within the subcutaneous fat of the right gluteal region measuring 8.4 x 10.4 x 22.2 cm in keeping with an additional subcutaneous hematoma. Again, no active extravasation identified within this collection. No free intraperitoneal fluid noted within the visualized pelvis. No extraperitoneal hemorrhage noted within the deep pelvis. IMPRESSION: 1. 12.1 x 30.2 x 7.6 cm subcutaneous hematoma within the anterior right thigh. 2. 8.4 x 10.4 x 22.2 cm subcutaneous hematoma within the right gluteal region. 3. No active extravasation identified. 4. No acute fracture or dislocation. 5. 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 6. 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 7. Marked fatty atrophy of the semimembranosus and biceps femoris as well as the rectus femoris. Milder atrophy involving the vastus intermedius and vastus lateralis. Electronically Signed   By: Worthy Heads M.D.   On: 08/01/2023 23:52   MR BRAIN WO CONTRAST Result Date:  07/26/2023 CLINICAL DATA:  Follow-up examination for aspergillosis, altered mental status, immunosuppressed. EXAM: MRI HEAD WITHOUT CONTRAST TECHNIQUE: Multiplanar, multiecho pulse sequences of the brain and surrounding structures were obtained without intravenous contrast. COMPARISON:  MRI from 07/18/2023. FINDINGS:  Brain: Cerebral volume within normal limits for age. Patchy T2/FLAIR hyperintensity involving the supratentorial cerebral white matter, most like related chronic microvascular ischemic disease, stable. No evidence for acute or subacute infarct. No areas of chronic cortical infarction or other insult. No acute intracranial hemorrhage. Single punctate chronic microhemorrhage noted at the left frontal corona radiata, stable. No mass lesion, midline shift or mass effect. Ventricles stable in size without hydrocephalus. No extra-axial fluid collection. Pituitary gland and suprasellar region within normal limits. No evidence for acute CNS infection by MRI. Vascular: Major intracranial vascular flow voids are stable and maintained. Skull and upper cervical spine: Craniocervical junction within normal limits. Diffusely abnormal appearance of the visualized osseous structures, consistent with Mets or myeloma, relatively stable from prior. No scalp soft tissue abnormality. Sinuses/Orbits: Globes and orbital soft tissues within normal limits. Scattered mucosal thickening present about the sphenoid and maxillary sinuses. Large right with moderate left mastoid effusions. Other: Incidental cystic lesion at the nasal bridge again noted, likely a dermoid/epidermoid, stable. IMPRESSION: 1. Stable brain MRI. No acute intracranial abnormality. 2. Diffusely abnormal appearance of the visualized osseous structures, concerning for metastatic disease or myeloma, stable. 3. Moderate cerebral white matter disease, nonspecific, but most commonly related to chronic microvascular ischemic disease. 4. Large right with moderate left  mastoid effusions, of uncertain significance. Correlation with physical exam recommended. Electronically Signed   By: Virgia Griffins M.D.   On: 07/26/2023 18:25   DG Chest Port 1 View Result Date: 07/26/2023 CLINICAL DATA:  [id number] Increased tracheal secretions [id number] EXAM: PORTABLE CHEST 1 VIEW COMPARISON:  Jul 24, 2023 FINDINGS: The cardiomediastinal silhouette is unchanged in contour.Tracheostomy. The enteric tube courses through the chest to the abdomen with weighted metallic tip projecting over the stomach. RIGHT IJ CVC catheter tip terminates over the SVC. No pleural effusion. No pneumothorax. Perihilar vascular fullness. There is bronchial wall cuffing. Biapical airspace opacities and LEFT retrocardiac airspace opacity. Advanced degenerative changes of the shoulders. IMPRESSION: 1. Bronchial wall thickening with patchy bilateral airspace opacities likely reflect multifocal infection. Differential considerations include pulmonary edema. Electronically Signed   By: Clancy Crimes M.D.   On: 07/26/2023 10:31   CT CHEST WO CONTRAST Result Date: 07/26/2023 CLINICAL DATA:  Hypoxic respiratory failure. Evaluate for changes suggestive of invasive fungal infection. EXAM: CT CHEST WITHOUT CONTRAST TECHNIQUE: Multidetector CT imaging of the chest was performed following the standard protocol without IV contrast. RADIATION DOSE REDUCTION: This exam was performed according to the departmental dose-optimization program which includes automated exposure control, adjustment of the mA and/or kV according to patient size and/or use of iterative reconstruction technique. COMPARISON:  07/20/2023 FINDINGS: Cardiovascular: Heart size is normal. There is no pericardial effusion. Aortic atherosclerosis and coronary artery calcifications. Mediastinum/Nodes: Tracheostomy tube tip is above the carina. Enteric tube tip is in the body of the stomach. No mediastinal or hilar adenopathy. Lungs/Pleura: Decreased volume of  bilateral pleural effusions. Partial atelectasis of bilateral lower lobes identified. Progressive ground-glass and airspace densities within the left upper lobe noted. There also multiple nodular densities with surrounding ground-glass attenuation throughout both upper lung zones, also increased from the previous exam. Upper Abdomen: No acute abnormality. Nonobstructing stone within the upper pole of the right kidney, image 154/2. Musculoskeletal: Multiple lucent bone lesions are identified throughout the visualized bony thorax. These appears similar in distribution to the previous exam. Unchanged mild compression deformity involving the superior endplate of the T11 vertebra. IMPRESSION: 1. Decreased volume of bilateral pleural effusions. 2. Progressive ground-glass and airspace densities within  the left upper lobe. There also multiple nodular densities with surrounding ground-glass attenuation throughout both upper lung zones, also increased from the previous exam. Findings are favored to represent multifocal pneumonia. Nodular densities with surrounding ground-glass attenuation may be seen in the setting of fungal infection particularly and anion 0 compromised in visual. 3. Multiple lucent bone lesions are identified throughout the visualized bony thorax. These appears similar in distribution to the previous exam. Unchanged mild compression deformity involving the superior endplate of the T11 vertebra. Imaging findings are concerning for metastatic disease versus multiple myeloma. 4. Nonobstructing right renal stone. 5. Coronary artery calcifications. 6.  Aortic Atherosclerosis (ICD10-I70.0). Electronically Signed   By: Kimberley Penman M.D.   On: 07/26/2023 04:56   VAS US  UPPER EXTREMITY VENOUS DUPLEX Result Date: 07/25/2023 UPPER VENOUS STUDY  Patient Name:  Frank Winfree.  Date of Exam:   07/24/2023 Medical Rec #: [id number]           Accession #:    [id number] Date of Birth: [birth year]           Patient  Gender: M Patient Age:   48 years Exam Location:  [facility] Procedure:      VAS US  UPPER EXTREMITY VENOUS DUPLEX Referring Phys: Donavon Fudge HOFFMAN --------------------------------------------------------------------------------  Indications: Edema Risk Factors: Multiple myeloma / chemotherapy. Comparison Study: No previous exams Performing Technologist: Jody Hill RVT, RDMS  Examination Guidelines: A complete evaluation includes B-mode imaging, spectral Doppler, color Doppler, and power Doppler as needed of all accessible portions of each vessel. Bilateral testing is considered an integral part of a complete examination. Limited examinations for reoccurring indications may be performed as noted.  Right Findings: Subclvian vein not visualized due to patient recieving HD bedside  Left Findings: +----------+------------+---------+-----------+----------+---------------------+ LEFT      CompressiblePhasicitySpontaneousProperties       Summary        +----------+------------+---------+-----------+----------+---------------------+ IJV           Full       Yes       Yes                                    +----------+------------+---------+-----------+----------+---------------------+ Subclavian    Full       Yes       Yes                                    +----------+------------+---------+-----------+----------+---------------------+ Axillary      Full       Yes       Yes                                    +----------+------------+---------+-----------+----------+---------------------+ Brachial      Full       Yes       Yes                                    +----------+------------+---------+-----------+----------+---------------------+ Radial        Full                                                        +----------+------------+---------+-----------+----------+---------------------+  Ulnar         Full                                   only one of paired                                                             visualized       +----------+------------+---------+-----------+----------+---------------------+ Cephalic      Full                                                        +----------+------------+---------+-----------+----------+---------------------+ Basilic       Full       Yes       Yes                                    +----------+------------+---------+-----------+----------+---------------------+ Large non vascularized heterogenous area in mid and distal upper arm with cystic and solid appearing areas.  Summary:  Right: Unable to visualize subclavian vein on this exam.  Left: No evidence of deep vein thrombosis in the upper extremity. No evidence of superficial vein thrombosis in the upper extremity. Large non vascularized heterogenous area in mid and distal upper arm with cystic and solid appearing areas.  *See table(s) above for measurements and observations.  Diagnosing physician: Runell Countryman Electronically signed by Runell Countryman on 07/25/2023 at 9:32:22 AM.    Final    DG Chest Port 1 View Result Date: 07/24/2023 CLINICAL DATA:  Status post tracheostomy. EXAM: PORTABLE CHEST 1 VIEW COMPARISON:  Chest radiograph dated 07/22/2023. FINDINGS: Tracheostomy with tip approximately 5.5 cm above the carina. Enteric tube extends below the diaphragm with tip beyond the inferior margin of the image. Right IJ central venous line in similar position. Similar appearance of bilateral pulmonary opacities. No pleural effusion or pneumothorax. The cardiac silhouette. No acute osseous pathology. IMPRESSION: 1. Tracheostomy with tip approximately 5.5 cm above the carina. 2. Similar appearance of bilateral pulmonary opacities. Electronically Signed   By: Angus Bark M.D.   On: 07/24/2023 14:17   DG CHEST PORT 1 VIEW Result Date: 07/22/2023 CLINICAL DATA:  Aspiration pneumonia. EXAM: PORTABLE CHEST 1 VIEW COMPARISON:  Jul 18, 2023. FINDINGS: Stable  cardiomediastinal silhouette. Tracheostomy and feeding tubes are unchanged. Right internal jugular catheter is unchanged. Increased bilateral lung opacities are noted diffusely consistent with edema or atypical inflammation. Severe degenerative changes are seen involving both glenohumeral joints. IMPRESSION: Stable support apparatus. Increased bilateral lung opacities concerning for edema or pneumonia. Electronically Signed   By: Rosalene Colon M.D.   On: 07/22/2023 14:39   DG Abd Portable 1V Result Date: 07/22/2023 CLINICAL DATA:  Ileus. EXAM: PORTABLE ABDOMEN - 1 VIEW COMPARISON:  July 07, 2023. FINDINGS: The bowel gas pattern is normal. Distal tip of feeding tube is seen in expected position of proximal stomach. No radio-opaque calculi or other significant radiographic abnormality are seen. IMPRESSION: No abnormal bowel dilatation. Electronically Signed   By: Royston Cornea  Alaine Alken M.D.   On: 07/22/2023 14:38   CT CHEST WO CONTRAST Result Date: 07/20/2023 CLINICAL DATA:  Sepsis EXAM: CT CHEST WITHOUT CONTRAST TECHNIQUE: Multidetector CT imaging of the chest was performed following the standard protocol without IV contrast. RADIATION DOSE REDUCTION: This exam was performed according to the departmental dose-optimization program which includes automated exposure control, adjustment of the mA and/or kV according to patient size and/or use of iterative reconstruction technique. COMPARISON:  None Available. FINDINGS: Cardiovascular: No significant vascular findings. The heart is mildly enlarged. There is no pericardial effusion. Aorta is normal in size. Central venous catheter tip ends in the SVC. Mediastinum/Nodes: Endotracheal tube tip is 2.4 cm above the carina. Enteric tube is seen throughout nondilated esophagus. No enlarged lymph nodes are identified allowing for lack of intravenous contrast. The visualized thyroid gland is within normal limits. Lungs/Pleura: There are small bilateral pleural effusions. There is  bilateral lower lobe airspace consolidation. Multifocal patchy airspace areas and ill-defined ground-glass slightly nodular opacities are seen throughout the bilateral upper lobes and minimally in the right middle lobe, also likely infectious/inflammatory. There is no pneumothorax. Upper Abdomen: Gallstones are present. Enteric tube is partially visualized in the stomach. Musculoskeletal: There are severe degenerative changes in both shoulders. The bones are diffusely osteopenic. There are scattered lucent lesions throughout the thoracic spine measuring up to 8 mm. There is minimal compression deformity of the superior endplate of T11. IMPRESSION: 1. Bilateral lower lobe airspace consolidation compatible with pneumonia. 2. Multifocal patchy airspace areas and ill-defined ground-glass slightly nodular opacities throughout the bilateral upper lobes and minimally in the right middle lobe, also likely infectious/inflammatory. 3. Small bilateral pleural effusions. 4. Mild cardiomegaly. 5. Cholelithiasis. 6. Scattered lucent lesions throughout the thoracic spine measuring up to 8 mm, nonspecific. Findings may be related to multiple myeloma or metastatic disease. 7. Minimal compression deformity of the superior endplate of T11. Electronically Signed   By: Tyron Gallon M.D.   On: 07/20/2023 16:31   VAS US  LOWER EXTREMITY VENOUS (DVT) Result Date: 07/18/2023  Lower Venous DVT Study Patient Name:  Frank Seda.  Date of Exam:   07/18/2023 Medical Rec #: [id number]           Accession #:    [id number] Date of Birth: Mar 09, 1966           Patient Gender: M Patient Age:   42 years Exam Location:  [facility] Procedure:      VAS US  LOWER EXTREMITY VENOUS (DVT) Referring Phys: ADITYA PALIWAL --------------------------------------------------------------------------------  Indications: Swelling, and Edema.  Risk Factors: Immobility Cancer myeloma. Limitations: Patient positioning. Comparison Study: None. Performing  Technologist: Estanislao Heimlich  Examination Guidelines: A complete evaluation includes B-mode imaging, spectral Doppler, color Doppler, and power Doppler as needed of all accessible portions of each vessel. Bilateral testing is considered an integral part of a complete examination. Limited examinations for reoccurring indications may be performed as noted. The reflux portion of the exam is performed with the patient in reverse Trendelenburg.  +-----+---------------+---------+-----------+----------+--------------+ RIGHTCompressibilityPhasicitySpontaneityPropertiesThrombus Aging +-----+---------------+---------+-----------+----------+--------------+ CFV  Full           Yes      Yes                                 +-----+---------------+---------+-----------+----------+--------------+ Large hematoma noted at top of right leg  +---------+---------------+---------+-----------+----------+-------------------+ LEFT     CompressibilityPhasicitySpontaneityPropertiesThrombus Aging      +---------+---------------+---------+-----------+----------+-------------------+ CFV  Full           Yes      Yes                                      +---------+---------------+---------+-----------+----------+-------------------+ SFJ      Full                                                             +---------+---------------+---------+-----------+----------+-------------------+ FV Prox  Full                                                             +---------+---------------+---------+-----------+----------+-------------------+ FV Mid   Full                                                             +---------+---------------+---------+-----------+----------+-------------------+ FV DistalFull                                                             +---------+---------------+---------+-----------+----------+-------------------+ PFV      Full                                                              +---------+---------------+---------+-----------+----------+-------------------+ POP      Full           Yes      Yes                                      +---------+---------------+---------+-----------+----------+-------------------+ PTV                              Yes                  Not well visualized +---------+---------------+---------+-----------+----------+-------------------+ PERO                             Yes                  Not well visualized +---------+---------------+---------+-----------+----------+-------------------+     Summary: RIGHT: - No evidence of common femoral vein obstruction.   LEFT: - There is no evidence of deep vein thrombosis in the lower extremity.  - No cystic structure found in the popliteal fossa.  *See table(s) above for measurements and observations.  Electronically signed by Delaney Fearing on 07/18/2023 at 4:52:00 PM.    Final    DG Chest 1 View Result Date: 07/18/2023 CLINICAL DATA:  Endotracheal tube. EXAM: CHEST  1 VIEW COMPARISON:  Jul 17, 2023. FINDINGS: Endotracheal and feeding tube are in grossly good position. Right internal jugular catheter is unchanged. Mild bibasilar subsegmental atelectasis is noted with small pleural effusions. Degenerative changes are seen involving both shoulder joints. IMPRESSION: Support apparatus as noted above. Bibasilar atelectasis and probable small pleural effusions are noted. Electronically Signed   By: Rosalene Colon M.D.   On: 07/18/2023 15:58   DG FL GUIDED LUMBAR PUNCTURE Result Date: 07/18/2023 CLINICAL DATA:  Metabolic encephalopathy EXAM: DIAGNOSTIC LUMBAR PUNCTURE UNDER FLUOROSCOPIC GUIDANCE COMPARISON:  I assessed the brain imaging of 07/17/2023 and the CT abdomen of 07/08/2023 in preparation for this exam. FLUOROSCOPY: Radiation Exposure Index (as provided by the fluoroscopic device): 3.4 mGy Kerma PROCEDURE: The risks (including hemorrhage, infection, and nerve damage,  among others), benefits, and alternatives to fluoroscopically guided lumbar puncture were discussed with the patient's wife by telephone, as the patient is unable to consent and has altered mental status. The patient's wife understood and elected for the patient to undergo the procedure. Standard time-out was employed. Following sterile skin prep and local anesthetic administration consisting of 1 percent lidocaine , a 22 gauge spinal needle was advanced without difficulty into the thecal sac at the L2-3 level under fluoroscopic guidance. Clear CSF was returned. Because the patient is intubated, I did not turn him onto his side to obtain a pressure measurement. A total of 11.5 cc of clear CSF was collected in 4 vials. The needle was subsequently removed and the skin cleansed and bandaged. No immediate complications were observed. IMPRESSION: Technically successful fluoroscopically guided lumbar puncture at the L2-3 level. Electronically Signed   By: Freida Jes M.D.   On: 07/18/2023 13:39   MR BRAIN WO CONTRAST Result Date: 07/18/2023 CLINICAL DATA:  Delirium EXAM: MRI HEAD WITHOUT CONTRAST TECHNIQUE: Multiplanar, multiecho pulse sequences of the brain and surrounding structures were obtained without intravenous contrast. COMPARISON:  CT head 07/17/2023. FINDINGS: Brain: No acute infarct. Punctate susceptibility in the left corona radiata suggestive of chronic microhemorrhage. No additional foci of hemorrhage appreciated. Scattered and confluent T2/FLAIR hyperintensity in the periventricular and subcortical white matter. No mass lesion or midline shift. Cerebellum is unremarkable. Normal appearance of midline structures. The basilar cisterns are patent. No extra-axial fluid collections. Ventricles: Normal size and configuration of the ventricles. Vascular: Skull base flow voids are visualized. Skull and upper cervical spine: Facet arthrosis in the visualized upper cervical spine most pronounced on the right  at C3-4. Redemonstration of multiple calvarial lesions corresponding to lytic lesions on CT. Sinuses/Orbits: The orbits are symmetric. Minimal mucosal thickening in the alveolar recess of the right maxillary sinus. There is a 1.3 x 1.0 x 1.5 cm circumscribed cystic appearing lesion in the subcutaneous tissues overlying the nasal bones slightly right of midline which demonstrates internal restricted diffusion. No evidence of connection to the intracranial compartment. Other: Large right mastoid effusion. Trace fluid in the left mastoid air cells. Fluid within the nasopharynx. Partially visualized enteric tube and endotracheal tube. IMPRESSION: No acute intracranial abnormality. Nonspecific white matter signal abnormality likely reflecting moderate chronic microvascular ischemic changes. Multiple calvarial lesions corresponding to lytic lesions on CT. Findings concerning for multiple myeloma versus metastasis. 1.5 cm cystic lesion in the subcutaneous tissues overlying the nasal bones likely reflecting epidermoid cyst versus dermoid. No evidence of  connection to the intracranial compartment. Large right mastoid effusion. Electronically Signed   By: Denny Flack M.D.   On: 07/18/2023 12:54   CT HEAD WO CONTRAST ( ) Result Date: 07/17/2023 CLINICAL DATA:  58 year old male altered mental status. Respiratory failure. "Myeloma". EXAM: CT HEAD WITHOUT CONTRAST TECHNIQUE: Contiguous axial images were obtained from the base of the skull through the vertex without intravenous contrast. RADIATION DOSE REDUCTION: This exam was performed according to the departmental dose-optimization program which includes automated exposure control, adjustment of the mA and/or kV according to patient size and/or use of iterative reconstruction technique. COMPARISON:  No prior head CT.  CT Abdomen and Pelvis 07/08/2023. FINDINGS: Brain: Cerebral volume is within normal limits for age. No midline shift, ventriculomegaly, mass effect, evidence  of mass lesion, intracranial hemorrhage or evidence of cortically based acute infarction. Gray-white differentiation maintained, mild to moderate for age scattered cerebral white matter hypodensity. Vascular: Calcified atherosclerosis at the skull base. No suspicious intracranial vascular hyperdensity. Skull: Scattered nonspecific round an oval lucent areas in the bilateral calvarium. No pathologic fracture identified. Sinuses/Orbits: Intubated. Right nasoenteric tube in place. Relatively well aerated paranasal sinuses, partial opacification of the right middle ear and mastoids. Other: Visualized orbits and scalp soft tissues are within normal limits. IMPRESSION: 1. No acute intracranial abnormality. Mild to moderate for age cerebral white matter changes most commonly due to small vessel disease. 2. Scattered lytic lesions in the skull, similar to the skeletal findings on 07/08/2023, and highly suspicious for Multiple Myeloma versus lytic metastases. 3. Intubated. Electronically Signed   By: Marlise Simpers M.D.   On: 07/17/2023 14:37   DG CHEST PORT 1 VIEW Result Date: 07/17/2023 CLINICAL DATA:  Acute respiratory failure.  Hypoxia. EXAM: PORTABLE CHEST 1 VIEW COMPARISON:  07/13/2023 FINDINGS: A feeding tube passes into the stomach although the distal tip position is not included on the film. Right IJ central line tip overlies the proximal SVC level. Low volume film with interval development of vascular congestion basilar predominant interstitial opacity, concerning for edema. Probable small left effusion. Cardiopericardial silhouette is at upper limits of normal for size. Degenerative changes noted in both shoulders. Telemetry leads overlie the chest. IMPRESSION: Low volume film with interval development of vascular congestion and basilar predominant interstitial opacity, concerning for edema. Probable small left effusion. Electronically Signed   By: Donnal Fusi M.D.   On: 07/17/2023 07:41   DG Chest Port 1  View Result Date: 07/13/2023 CLINICAL DATA:  Respiratory failure EXAM: PORTABLE CHEST 1 VIEW COMPARISON:  07/09/2023 FINDINGS: Support apparatus: Endotracheal tube terminates 5.9 cm above carina. Right IJ Cordis sheath unchanged. Feeding tube extends beyond the inferior aspect of the film. Heart/mediastinum: Midline trachea.  Normal heart size. Pleura: Possible trace left pleural fluid.  No pneumothorax. Lungs: Persistent left and increased right base airspace disease. Other: Right glenohumeral joint degenerative change. IMPRESSION: Similar left and increased right base airspace disease, favoring atelectasis. Possible trace left pleural fluid. Electronically Signed   By: Lore Rode M.D.   On: 07/13/2023 09:39   EEG adult Result Date: 07/11/2023 Arleene Lack, MD     07/11/2023  3:28 PM Patient Name: Frank Lobo. MRN: [id number] Epilepsy Attending: Arleene Lack Referring Physician/Provider: Casimiro Cleaves, PA-C Date: 07/11/2023 Duration: 24.55 mins Patient history: 58yo M with ams. EEG to evaluate for seizure Level of alertness:  comatose/ lethargic AEDs during EEG study: VPA Technical aspects: This EEG study was done with scalp electrodes positioned according to the 10-20 International  system of electrode placement. Electrical activity was reviewed with band pass filter of 1-70Hz , sensitivity of 7 uV/mm, display speed of 47mm/sec with a 60Hz  notched filter applied as appropriate. EEG data were recorded continuously and digitally stored.  Video monitoring was available and reviewed as appropriate. Description: EEG showed continuous generalized 3-5 hz theta-delta slowing, at times with triphasic morphology.  Hyperventilation and photic stimulation were not performed.   ABNORMALITY - Continuous slow, generalized IMPRESSION: This study is suggestive of moderate to severe encephalopathy, could be secondary to toxic metabolic causes. No seizures or epileptiform discharges were seen throughout the  recording. Arleene Lack   DG Chest 1 View Result Date: 07/09/2023 CLINICAL DATA:  Status post central line placement EXAM: PORTABLE CHEST 1 VIEW COMPARISON:  07/08/2023 FINDINGS: Endotracheal tube and feeding catheter are noted in satisfactory position. New right jugular catheter is seen in satisfactory position. No pneumothorax is noted. The overall inspiratory effort is poor with crowding of the vascular markings. IMPRESSION: Poor inspiratory effort. No pneumothorax following central line placement. Electronically Signed   By: Violeta Grey M.D.   On: 07/09/2023 11:19   CT ABDOMEN PELVIS WO CONTRAST Result Date: 07/08/2023 CLINICAL DATA:  Palpable right groin mass. Anemia requiring 3 units of packed red blood cells. Sepsis due to acquired pneumonia. Lytic bone lesions with a clinical concern for a new diagnosis of multiple myeloma. Type 2 diabetes. EXAM: CT ABDOMEN AND PELVIS WITHOUT CONTRAST TECHNIQUE: Multidetector CT imaging of the abdomen and pelvis was performed following the standard protocol without IV contrast. RADIATION DOSE REDUCTION: This exam was performed according to the departmental dose-optimization program which includes automated exposure control, adjustment of the mA and/or kV according to patient size and/or use of iterative reconstruction technique. COMPARISON:  CT-guided right iliac bone marrow aspiration and core biopsy dated 07/04/2023. FINDINGS: Lower chest: Multifocal patchy opacities in both lower lobes. Small left pleural effusion and minimal right pleural effusion. Borderline enlarged heart. Hepatobiliary: Normal-appearing liver. Dependent calcification in the gallbladder without gallbladder wall thickening or pericholecystic fluid. Pancreas: Unremarkable. No pancreatic ductal dilatation or surrounding inflammatory changes. Spleen: Normal in size without focal abnormality. Adrenals/Urinary Tract: Foley catheter in the urinary bladder with no significant urine in the bladder.  Single tiny calculus in each kidney. Small, exophytic high density left renal cyst compatible with a hemorrhagic or proteinaceous cyst. Probable subcentimeter exophytic left renal cyst, difficult to assess due to streak artifacts produced by the patient's left arm. This could be assessed with renal ultrasound. Unremarkable adrenal glands and ureters. Stomach/Bowel: Stomach is within normal limits. Appendix appears normal. No evidence of bowel wall thickening, distention, or inflammatory changes. Vascular/Lymphatic: Atheromatous arterial calcifications without aneurysm. No enlarged lymph nodes. Reproductive: Normal-sized prostate gland containing coarse calcifications. Other: Large right buttock and groin hematoma containing a fluid/hematocrit level. This measures 36.0 x 11.4 x 9.7 cm in maximum dimensions on sagittal and coronal images 32/7 and 39/6 respectively. Musculoskeletal: Left hip prosthesis with associated streak artifacts. Nondisplaced right 10th posterolateral rib fracture. Old, healed left posterolateral 10th rib fracture. Multiple small to medium-sized lytic lesions in multiple lumbar and lower thoracic vertebral bodies and in the pelvic bones bilaterally. Moderate right hip degenerative changes. IMPRESSION: 1. Large acute right buttock and groin hematoma measuring 36.0 x 11.4 x 9.7 cm. 2. Nondisplaced right 10th posterolateral rib fracture. 3. Multiple small to medium-sized lytic lesions in multiple lumbar and lower thoracic vertebral bodies and in the pelvic bones bilaterally, compatible with the clinical suspicion of multiple myeloma. Lytic metastases  can also have this appearance. 4. Multifocal patchy opacities in both lower lobes, compatible with known pneumonia. 5. Small left pleural effusion and minimal right pleural effusion. 6. Cholelithiasis. 7. Tiny bilateral renal calculi. 8. Probable subcentimeter exophytic left renal cyst, difficult to assess due to streak artifacts produced by the  patient's left arm. This could be assessed with renal ultrasound. Electronically Signed   By: Catherin Closs M.D.   On: 07/08/2023 16:07   DG CHEST PORT 1 VIEW Result Date: 07/08/2023 CLINICAL DATA:  [id number] Endotracheally intubated [id number] EXAM: PORTABLE CHEST 1 VIEW COMPARISON:  07/08/2023 FINDINGS: The endotracheal tube is positioned just to the left of midline, likely within the mid trachea, likely due to patient rotation. Mild cardiomegaly. No focal airspace consolidation, pleural effusion, or pneumothorax. No cardiomegaly. No acute fracture or destructive lesion. Moderate osteoarthritis of the right shoulder. Multilevel degenerative disc disease of the spine. Weighted feeding tube courses below the diaphragm with the distal tip not included in the field of view. IMPRESSION: 1. The endotracheal tube is positioned just to the left of midline, likely within the mid trachea, but abnormally positioned due to patient rotation. No pneumothorax. 2. Weighted feeding tube courses below the diaphragm, distal tip outside the field of view. Electronically Signed   By: Rance Burrows M.D.   On: 07/08/2023 14:02    Labs:  CBC: Recent Labs    08/01/23 0516 08/01/23 1835 08/02/23 1006 08/03/23 0322 08/06/23 0205  WBC 17.3*  --  15.2* 11.1* 10.8*  HGB 9.0* 10.4* 9.8* 8.9* 9.4*  HCT 30.0* 33.5* 32.4* 29.5* 30.4*  PLT 313  --  325 330 306    COAGS: Recent Labs    07/05/23 2227 07/07/23 2030 07/10/23 0411 07/23/23 0416 07/24/23 0421 07/25/23 0409 07/26/23 0423  INR 2.1* 1.9*  --   --   --   --   --   APTT  --  44*   < > 39* 45* 34 36   < > = values in this interval not displayed.    BMP: Recent Labs    08/05/23 1154 08/06/23 0205 08/06/23 0206 08/07/23 0256  NA 134* 137 139 136  K 3.9 4.2 4.3 4.6  CL 98 98 99 101  CO2 24 23 23 22   GLUCOSE 196* 167* 166* 199*  BUN 111* 136* 137* 172*  CALCIUM  8.0* 8.1* 8.2* 7.9*  CREATININE 4.08* 4.67* 4.55* 4.90*  GFRNONAA 16* 14* 14* 13*     LIVER FUNCTION TESTS: Recent Labs    07/30/23 0424 07/31/23 0509 08/01/23 0516 08/05/23 1154 08/06/23 0205 08/06/23 0206 08/07/23 0256  BILITOT 0.8 0.6 0.8  --  0.7  --   --   AST 12* 16 21  --  13*  --   --   ALT 8 10 11   --  12  --   --   ALKPHOS 76 90 79  --  62  --   --   PROT 8.0 8.5* 8.0  --  8.0  --   --   ALBUMIN  1.7* 1.9* 1.8* 1.9* 2.0* 2.0* 1.9*    TUMOR MARKERS: No results for input(s): "AFPTM", "CEA", "CA199", "CHROMGRNA" in the last 8760 hours.  Assessment and Plan:  Frank Cropper. is a 58 y.o. male with recently diagnosed multiple myeloma in April 2025. Initially had presented to [facility] with generalized weakness and AMS. Pt was found to have AKI and multiple lytic lesions concerning for multiple myeloma, with this later being confirmed with  biopsy. Pt was since transferred to [facility] on 07/03/23 and has been in the hospital with AMS, intubated and later with tracheostomy placement. Has had fluctuating fevers that oncology and medicine team have attributed to underlying myeloma. Has had worsening AKI with need for HD, and IR has now been consulted for tunneled HD catheter placement.   Risks and benefits discussed with the patient's wife including, but not limited to bleeding, infection, vascular injury, pneumothorax which may require chest tube placement, air embolism or even death. All of the patient's wife's questions were answered, patient is agreeable to proceed. Consent signed and in chart.   Thank you for allowing our service to participate in Frank [name]. 's care.  Electronically Signed: Nicolasa Barrett, PA-C   08/07/2023, 9:47 AM      I spent a total of 40 Minutes    in face to face in clinical consultation, greater than 50% of which was counseling/coordinating care for tunneled hemodialysis catheter.

## 2023-08-07 NOTE — Procedures (Signed)
 Interventional Radiology Procedure Note  Procedure: Tunneled hemodialysis catheter placement  Findings: Please refer to procedural dictation for full description. 23 cm right internal jugular.  Complications: None immediate  Estimated Blood Loss: < 5 ml  Recommendations: Catheter ready for immediate use.   Marliss Coots, MD

## 2023-08-07 NOTE — TOC Progression Note (Signed)
 Transition of Care (TOC) - Progression Note    Patient Details  Name: Frank [name]. MRN: [id number] Date of Birth: [birth year]  Transition of Care [facility]) CM/SW Contact  Ronni Colace, RN Phone Number: 08/07/2023, 3:04 PM  Clinical Narrative:     Will need a LTAC in [place]  since the patient is from [place]. Faxed out to [facility] in [place], the closest LTAC to the patients house. Sent PT H&P and Progress note ( latest from MD ) to  [phone]  Expected Discharge Plan: Long Term Acute Care (LTAC) Barriers to Discharge: Continued Medical Work up  Expected Discharge Plan and Services In-house Referral: Clinical Social Work Discharge Planning Services: CM Consult Post Acute Care Choice: Skilled Nursing Facility Living arrangements for the past 2 months: Single Family Home                 DME Arranged: N/A DME Agency: NA       HH Arranged: NA HH Agency: NA         Social Determinants of Health (SDOH) Interventions SDOH Screenings   Food Insecurity: No Food Insecurity (07/05/2023)  Housing: Low Risk  (07/05/2023)  Transportation Needs: No Transportation Needs (07/05/2023)  Utilities: Not At Risk (07/05/2023)  Tobacco Use: High Risk (07/10/2023)    Readmission Risk Interventions     No data to display

## 2023-08-07 NOTE — Progress Notes (Signed)
 [facility] NEPHROLOGY PROGRESS NOTE   Subjective:  Seen in ICU.  I discussed with ID, who feel that a tunneled catheter is reasonable at the current time Working with therapy and somewhat participatory BUN 172, K4.6, bicarbonate 22 Case discussed with Dr. Marce Sensing Receiving chemotherapy per oncology   Objective Vital signs in last 24 hours: Vitals:   07/26/23 1100 07/26/23 1125 07/26/23 1130 07/26/23 1200  BP: (!) 179/96 (!) 146/85 115/72 (!) 83/50  Pulse: (!) 105 96 93 76  Resp: 16 20 20 20   Temp:    99.8 F (37.7 C)  TempSrc:    Axillary  SpO2: 100% 98% 98% 99%  Weight:      Height:        Physical Exam: General: Critically ill looking male, on trach collar Heart:RRR, s1s2 nl Lungs: clear ant/ lat Abdomen:soft, non-tender, non-distended Extremities: no pitting edema, LUE distortion is broken bone not edema Neurology: responding minimally    Assessment/ Plan: Pt is a 58 y.o. yo male with past medical history significant for HTN, HLD, type II DM, BPH, seizure who was initially presented at [facility] due to AMS, admitted for sepsis due to pneumonia, encephalopathy, sepsis, AKI, hypercalcemia, symptomatic anemia and lytic lesion concern for multiple myeloma.  Problems # Oliguric AKI on CKD 3a - b/l creat 1.1- 1.5 from 2024. Creat here 5.8 on presentation at OSH. AKI due to myeloma kidney +/- hyperCa +/- ACEi. Started CRRT on 4/23. Hemodynamics improved and CRRT dc'd 5/10. Is on iHD now.  Plan for dialysis today following placement of tunneled HD catheter..  Line holiday 5/205/22.    # AMS/acute metabolic encephalopathy: MRI, LP completed and unrevealing.  Waxes and wanes  #Volume - appears euvolemic.  Gentle UF moving forward  # Hypotension:Not on pressors with stable blood pressures, normotensive, resolved.  # Multiple myeloma: started on Velcade  and Cytoxan , #1 4/22 and #2 4/25. Labs showed good response, last checked 07/22/23.  Resuming  chemotherapy currently.  #AHRF/ aspergillus PNA/ s/p trach 5/8: on voriconazole  IV, off all other IV antibiotics. Off the ventilator on trach collar, per CCM  # Atrial fib-  on amio  #Anemia - prn transfusions. On epogen  per onc.   # Persistent fevers with negative infectious workup thought to be related to malignancy  Charletta Cons, MD  07/26/2023, 1:31 PM  Recent Labs  Lab 07/24/23 0421 07/24/23 1630 07/25/23 1508 07/26/23 0423  HGB 8.9*  --   --  8.9*  ALBUMIN  1.6*   < > 1.6* 1.8*  1.6*  CALCIUM  7.4*   < > 7.7* 7.7*  7.7*  PHOS 2.0*  1.5*   < > RESULTS UNAVAILABLE DUE TO INTERFERING SUBSTANCE 1.9*  1.5*  CREATININE 1.59*   < > 1.62* 1.77*  1.74*  K 4.3   < > 4.6 4.4  4.5   < > = values in this interval not displayed.    Inpatient medications:  amiodarone   200 mg Per NG tube Daily   arformoterol   15 mcg Nebulization BID   busPIRone   15 mg Per Tube QHS   clonazePAM   1 mg Per Tube BID   docusate  100 mg Per Tube BID   epoetin  alfa  40,000 Units Subcutaneous QODAY   famotidine   10.4 mg Per Tube Daily   feeding supplement (PROSource TF20)  60 mL Per Tube TID   folic acid   1 mg Per Tube Daily   gabapentin   100 mg Per Tube Q12H   heparin   injection (subcutaneous)  5,000 Units Subcutaneous Q8H   insulin  aspart  0-20 Units Subcutaneous Q4H   insulin  aspart  8 Units Subcutaneous Q4H   insulin  glargine-yfgn  10 Units Subcutaneous BID   lactulose   20 g Per Tube TID   leptospermum manuka honey  1 Application Topical Daily   lidocaine -EPINEPHrine   20 mL Intradermal Once   midodrine   10 mg Per Tube TID WC   nicotine   7 mg Transdermal Daily   mouth rinse  15 mL Mouth Rinse Q2H   PARoxetine   40 mg Per Tube Daily   pneumococcal 20-valent conjugate vaccine  0.5 mL Intramuscular Tomorrow-1000   polyethylene glycol  17 g Per Tube Daily   QUEtiapine   50 mg Per Tube QHS   revefenacin   175 mcg Nebulization Daily   sodium chloride  flush  10-40 mL Intracatheter Q12H   sodium  chloride HYPERTONIC  4 mL Nebulization Daily   thiamine   100 mg Per Tube Daily    ampicillin -sulbactam (UNASYN ) IV Stopped (07/26/23 1610)   dexmedetomidine  (PRECEDEX ) IV infusion 1.2 mcg/kg/hr (07/26/23 1300)   feeding supplement (VITAL 1.5 CAL) 60 mL/hr at 07/26/23 1300   fentaNYL  infusion INTRAVENOUS 200 mcg/hr (07/26/23 1300)   levofloxacin  (LEVAQUIN ) IV 100 mL/hr at 07/26/23 1300   midazolam      norepinephrine  (LEVOPHED ) Adult infusion 2 mcg/min (07/26/23 1300)   sodium PHOSPHATE  IVPB (in mmol)     voriconazole  Stopped (07/26/23 1225)   Followed by   [name] ON 07/27/2023] voriconazole      acetaminophen  (TYLENOL ) oral liquid 160 mg/5 mL **OR** acetaminophen , fentaNYL , ipratropium-albuterol , midazolam , ondansetron  (ZOFRAN ) IV, mouth rinse, oxyCODONE , polyethylene glycol, polyvinyl alcohol , sodium chloride  flush

## 2023-08-07 NOTE — Progress Notes (Addendum)
 NAME:  Frank Demir., MRN:  [id number], DOB:  10/13/1965, LOS: 35 ADMISSION DATE:  07/03/2023, CONSULTATION DATE:  07/06/23 REFERRING MD:  Dr Maury Space, CHIEF COMPLAINT:  Encephalopathy   History of Present Illness:   PCCM asked to see patient for encephalopathy   Transferred from [facility] with 1 week of generalized weakness, altered mentation, decreased appetite Recently being worked up for multiple myeloma.  Transferred to [facility] for oncology evaluation Nephrology consulted for AKI   Background history of obesity, asthma, obstructive sleep apnea on CPAP GERD, hypertension, hyperlipidemia Has been feeling weak for the last few days according to spouse Admitted with concern for sepsis, community-acquired pneumonia, antibiotic encephalopathy Workup revealed lytic bony lesions with concern for multiple myeloma Transfused with 3 units packed red blood cells Evaluation so far-bone marrow 07/04/2023  Pertinent  Medical History   Past Medical History:  Diagnosis Date   Anxiety    Arthritis    Asthma    Bipolar disorder (HCC)    Current every day smoker    Depression    Diabetes mellitus, type II (HCC)    Dyspnea    History of kidney stones    History of pneumonia    Hyperlipidemia    Hypertension    Morbid obesity (HCC)    Sedentary lifestyle    Seizures (HCC)    last seizure 12 yrs ago, no current problem   Sleep apnea    uses CPAP nightly   Significant Hospital Events: Including procedures, antibiotic start and stop dates in addition to other pertinent events   4/19-blood cultures, MRSA PCR negative 4/19 chest x-ray with mild pulmonary congestion 4/20 atrial fibrillation with RVR 4/21 nosebleed after IR NG attempt, NG placed by PCCM in afternoon, stable transferred to [facility] 4/22 intubated; some abla requiring prbcs; ct abd/pelvis showing acute groin hematoma tracking from posterior presumed from site of bone marrow biopsy; heparin  held EEG 4/25 >> moderate to severe  encephalopathy without any seizures or epileptiform discharges 4/23 respiratory culture >> MRSE 4/28 remains intubated on CRRT, down to one pressor  4/29 labile BP, NE back up. Responded a bit to fluids. Received Velcade  and Cytoxan  chemotherapy 5/1 remains encephalopathic, check CTH 5/2 LP ETT exchange  5/5 1 PRBC. Palliative care met w family  5/6 IVIG. plan for trach established. Added midodrine  after failed vaso wean. Abx/fungal changed to levaquin   5/7 weaning pressors incr midodrine  incr lactulose . Vaso off  5/8 Trach placed. Some post op bleeding improved with thrombi-pad 5/10 voriconazole  added back based on CT Chest findings, MRI brain with no acute changes 5/13 more alert and able to follow some commands today 5/19 and had been doing well on trach collar, and was on the verge of transferring to progressive when had a trach dislodgment. 5/22 more awake.  Interim History / Subjective:   Remains on trach collar. Now following commands. Chemotherapy restarted.   Objective   Blood pressure (!) 136/92, pulse (!) 110, temperature 98.4 F (36.9 C), temperature source Oral, resp. rate (!) 23, height 6' 0.01" (1.829 m), weight 104.4 kg, SpO2 100%.    FiO2 (%):  [30 %] 30 %   Intake/Output Summary (Last 24 hours) at 08/07/2023 1003 Last data filed at 08/07/2023 0914 Gross per 24 hour  Intake 1791.19 ml  Output 300 ml  Net 1491.19 ml   Filed Weights   08/04/23 1841 08/05/23 0500 08/07/23 0708  Weight: 103 kg 99.5 kg 104.4 kg   Gen:   In bed in  no distress.  Obese. HEENT: Tracheostomy in place. Minimal secretions. Breathing well with occlusion but make no effort to speak.  Lungs:   Clear to auscultation bilaterally. CV:       Heart sounds are unremarkable. Abd:   Abdomen is soft. Continue to have diarrhea, Flexiseal in place Ext: No peripheral edema.  Swelling mid upper arm left side.  No pain on palpation. Skin:      Warm and dry; no rashes Neuro: Eyes open, minimal pain  response.  Ancillary tests personally reviewed   Micro: 4/28 BCx > no growth, final 4/30 respiratory culture-few PMN, rare budding yeast, abundant GPC> few aspergillus fumigatus Quantiferon 5/2 > neg Trach asp 5/4> rare GPC, rare mold  PCP smear trach asp 5/5 > neg  Trach asp 5/9 > abundant staph hemolyticus, few budding yeast Trach aspirate 5/15 - ngtd Blood cultures 5/11-no growth  CSF studies 1 RBC, 1 WBC, glucose 106 CSF culture-2 organisms acute, rare WBC> NGTD Cryptococcal antigen negative meningitis and encephalitis panel negative VDRL  non-reactive   XR left arm consistent with ORIF of pathologic fracture.   Assessment & Plan:   Metabolic encephalopathy Seizure disorder Hyperammonemia - corrected. Hypoactive delirium Acute hypoxemic respiratory failure - status post tracheostomy. Bilateral pleural effusions History of obstructive sleep apnea History of tobacco abuse Aspergillus pneumonia - questionable Acute kidney injury on chronic kidney disease stage IIIa Multiple myeloma Paroxysmal atrial fibrillation in the setting of acute illness, now back in sinus rhythm. Now in sinus rhythm Type 2 diabetes  Plan:  - Keep on trach collar. - Continue to minimize sedative agents.  Slowly more awake.  Will take time given renal failure.  Have stopped gabapentin  as well.  - IR to insert PermCath. iHD per nephrology. Make some urine but recovery not expected.  - Complete treatment for suspected Aspergillus pneumonia as per ID. - Line holiday as part of empiric fever workup.  Leukocytosis has improved but continues to have fevers which are likely related to malignancy. - Continue chemotherapy as per oncology for myeloma. - Continue oral amiodarone . No anticoagulated as remains in SR. - Tube feed insulin  coverage increased. - Transfer to PCU.  GOC: See IPAL from 5/7. Wife now updating goals to include 2-3 rounds of CPR maximum should he arrest. LTAC disposition pending,  referral to select has been made.  Appreciate palliative care involvement regarding goals of care  Best Practice (right click and "Reselect all SmartList Selections" daily)   Diet/type: tubefeeds  DVT prophylaxis prophylactic heparin   Pressure ulcer(s): identified on: 19/Apr/2025 GI prophylaxis: H2B Lines: Dialysis Catheter Foley:  na  Code Status:  full code Last date of multidisciplinary goals of care discussion d/w wife 5/7. See IPAL. Updated spouse by phone 08/07/2023.  Arlina Lair, MD [facility] ICU Physician [facility]  Critical Care  Pager: [phone] Mobile: [phone] After hours: [phone].

## 2023-08-07 NOTE — Progress Notes (Signed)
 Physical Therapy Treatment Patient Details Name: Frank [name]. MRN: [id number] DOB: 1965/03/24 Today's Date: 08/07/2023   History of Present Illness 58 y.o. male transferred from [facility] 07/03/23 to [facility] for management of newly diagnosed plasma cell myeloma with weakness and AMS. Pt also with AKI and metabolic encephalopathy. 5/10 transfer to [facility] for iHD. 4/22 Intubated and chemo initiated. 4/23-5/10 CRRT. 5/6 IVIG started. 5/8 trach. 5/12 iHD initiated. 5/18 inability to oxygenate through trach, intubated and trach revision. PMHx:Lt THA, carpal tunnel syndrome, Lt TKA, Lt humerus fx, PAF, HTN, HLD, bipolar disorder, obesity, T2DM    PT Comments  Pt alert and tracking therapist on arrival. Pt with limited command following for RUE and helping with minimal knee flexion for rolling. Pt fatigued end of session and unable to maintain eyes open or significantly assist with HEP after rolling and pericare. Will continue to follow and encourage chair position.      If plan is discharge home, recommend the following: Two people to help with walking and/or transfers;Two people to help with bathing/dressing/bathroom;Assistance with cooking/housework;Assistance with feeding;Assist for transportation;Direct supervision/assist for financial management;Direct supervision/assist for medications management   Can travel by private vehicle     No  Equipment Recommendations  Hospital bed;Hoyer lift    Recommendations for Other Services       Precautions / Restrictions Precautions Precautions: Fall;Other (comment) Recall of Precautions/Restrictions: Impaired Precaution/Restrictions Comments: trach, cortrak, flexiseal Restrictions Other Position/Activity Restrictions: Lt humerus non-union, splint in room without order     Mobility  Bed Mobility Overal bed mobility: Needs Assistance Bed Mobility: Rolling Rolling: +2 for physical assistance, Total assist         General bed mobility  comments: total +2 assist to roll bil for pericare and linen change as pt sitting in urine on arrival. Pt able to assist with bending knees for rolling. bed positioned into semichair end of session with pt fatigued and unable to reach for rail with RUE to pull trunk forward    Transfers                   General transfer comment: Will need mechanical lift    Ambulation/Gait                   Stairs             Wheelchair Mobility     Tilt Bed    Modified Rankin (Stroke Patients Only)       Balance                                            Communication Communication Factors Affecting Communication: Trach/intubated  Cognition Arousal: Alert Behavior During Therapy: Flat affect   PT - Cognitive impairments: Difficult to assess                       PT - Cognition Comments: pt with eyes open and attending to therapist with visual tracking at beginning of session and attempting to mouth word initially. No verbal responses. pt moving RUE on command and turning hand when given cue for "thumbs up" but could not extend thumb. intermittent command following with bending left knee Following commands: Impaired Following commands impaired: Follows one step commands inconsistently    Licensed conveyancer Exercises - Lower Extremity Short Arc Quad: PROM, Both,  10 reps, Supine Heel Slides: AAROM, Both, 10 reps, Supine    General Comments        Pertinent Vitals/Pain Pain Assessment Pain Assessment: CPOT Facial Expression: Relaxed, neutral Body Movements: Absence of movements Muscle Tension: Relaxed Compliance with ventilator (intubated pts.): N/A Vocalization (extubated pts.): Talking in normal tone or no sound CPOT Total: 0 Pain Intervention(s): Repositioned    Home Living                          Prior Function            PT Goals (current goals can now be found in the care plan section)  Progress towards PT goals: Progressing toward goals    Frequency    Min 1X/week      PT Plan      Co-evaluation              AM-PAC PT "6 Clicks" Mobility   Outcome Measure  Help needed turning from your back to your side while in a flat bed without using bedrails?: Total Help needed moving from lying on your back to sitting on the side of a flat bed without using bedrails?: Total Help needed moving to and from a bed to a chair (including a wheelchair)?: Total Help needed standing up from a chair using your arms (e.g., wheelchair or bedside chair)?: Total Help needed to walk in hospital room?: Total Help needed climbing 3-5 steps with a railing? : Total 6 Click Score: 6    End of Session Equipment Utilized During Treatment: Oxygen Activity Tolerance: Patient limited by lethargy Patient left: in bed;with call bell/phone within reach;with bed alarm set Nurse Communication: Mobility status;Need for lift equipment PT Visit Diagnosis: Other abnormalities of gait and mobility (R26.89);Muscle weakness (generalized) (M62.81);Difficulty in walking, not elsewhere classified (R26.2)     Time: 1610-9604 PT Time Calculation (min) (ACUTE ONLY): 23 min  Charges:    $Therapeutic Activity: 23-37 mins PT General Charges $$ ACUTE PT VISIT: 1 Visit                     Frank [name], PT Acute Rehabilitation Services Office: [phone]    Frank [name] 08/07/2023, 10:52 AM

## 2023-08-07 NOTE — Plan of Care (Signed)
 Pressure injuries on sacrum and buttocks continue to require daily dressings without showing signs of healing.

## 2023-08-08 ENCOUNTER — Encounter (HOSPITAL_COMMUNITY): Payer: Self-pay | Admitting: Hematology & Oncology

## 2023-08-08 ENCOUNTER — Other Ambulatory Visit: Payer: Self-pay

## 2023-08-08 DIAGNOSIS — R531 Weakness: Secondary | ICD-10-CM | POA: Diagnosis not present

## 2023-08-08 LAB — RENAL FUNCTION PANEL
Albumin: 2.2 g/dL — ABNORMAL LOW (ref 3.5–5.0)
Anion gap: 11 (ref 5–15)
BUN: 101 mg/dL — ABNORMAL HIGH (ref 6–20)
CO2: 25 mmol/L (ref 22–32)
Calcium: 7.7 mg/dL — ABNORMAL LOW (ref 8.9–10.3)
Chloride: 96 mmol/L — ABNORMAL LOW (ref 98–111)
Creatinine, Ser: 2.83 mg/dL — ABNORMAL HIGH (ref 0.61–1.24)
GFR, Estimated: 25 mL/min — ABNORMAL LOW (ref >60)
Glucose, Bld: 158 mg/dL — ABNORMAL HIGH (ref 70–99)
Potassium: 3.4 mmol/L — ABNORMAL LOW (ref 3.5–5.1)
Sodium: 132 mmol/L — ABNORMAL LOW (ref 135–145)

## 2023-08-08 LAB — GLUCOSE, CAPILLARY
Glucose-Capillary: 184 mg/dL — ABNORMAL HIGH (ref 70–99)
Glucose-Capillary: 188 mg/dL — ABNORMAL HIGH (ref 70–99)
Glucose-Capillary: 201 mg/dL — ABNORMAL HIGH (ref 70–99)
Glucose-Capillary: 219 mg/dL — ABNORMAL HIGH (ref 70–99)
Glucose-Capillary: 235 mg/dL — ABNORMAL HIGH (ref 70–99)
Glucose-Capillary: 251 mg/dL — ABNORMAL HIGH (ref 70–99)

## 2023-08-08 LAB — COMPREHENSIVE METABOLIC PANEL WITH GFR
ALT: 10 U/L (ref 0–44)
AST: 15 U/L (ref 15–41)
Albumin: 2.2 g/dL — ABNORMAL LOW (ref 3.5–5.0)
Alkaline Phosphatase: 58 U/L (ref 38–126)
Anion gap: 10 (ref 5–15)
BUN: 103 mg/dL — ABNORMAL HIGH (ref 6–20)
CO2: 25 mmol/L (ref 22–32)
Calcium: 7.6 mg/dL — ABNORMAL LOW (ref 8.9–10.3)
Chloride: 96 mmol/L — ABNORMAL LOW (ref 98–111)
Creatinine, Ser: 2.86 mg/dL — ABNORMAL HIGH (ref 0.61–1.24)
GFR, Estimated: 25 mL/min — ABNORMAL LOW (ref >60)
Glucose, Bld: 158 mg/dL — ABNORMAL HIGH (ref 70–99)
Potassium: 3.4 mmol/L — ABNORMAL LOW (ref 3.5–5.1)
Sodium: 131 mmol/L — ABNORMAL LOW (ref 135–145)
Total Bilirubin: 0.5 mg/dL (ref 0.0–1.2)
Total Protein: 8.8 g/dL — ABNORMAL HIGH (ref 6.5–8.1)

## 2023-08-08 LAB — MAGNESIUM: Magnesium: 2.5 mg/dL — ABNORMAL HIGH (ref 1.7–2.4)

## 2023-08-08 LAB — LACTATE DEHYDROGENASE: LDH: 347 U/L — ABNORMAL HIGH (ref 98–192)

## 2023-08-08 MED ORDER — ACYCLOVIR 200 MG PO CAPS
200.0000 mg | ORAL_CAPSULE | Freq: Two times a day (BID) | ORAL | Status: DC
  Filled 2023-08-08: qty 1

## 2023-08-08 MED ORDER — HEPARIN SODIUM (PORCINE) 1000 UNIT/ML IJ SOLN
INTRAMUSCULAR | Status: AC
  Filled 2023-08-08: qty 4

## 2023-08-08 MED ORDER — ACYCLOVIR 200 MG PO CAPS
200.0000 mg | ORAL_CAPSULE | Freq: Two times a day (BID) | ORAL | Status: DC
  Administered 2023-08-09 – 2023-09-06 (×54): 200 mg
  Filled 2023-08-08 (×59): qty 1

## 2023-08-08 NOTE — TOC Progression Note (Signed)
 Transition of Care (TOC) - Progression Note    Patient Details  Name: Frank [name]. MRN: [id number] Date of Birth: 09-29-1965  Transition of Care [facility]) CM/SW Contact  Jonathan Neighbor, RN Phone Number: 08/08/2023, 1:33 PM  Clinical Narrative:     Carolanne Church had not received referral. Per MD need to see about chemo before LTACH as this seems to be a barrier. Will revisit referrals to [facility] next week.  TOC following.  Expected Discharge Plan: Long Term Acute Care (LTAC) Barriers to Discharge: Continued Medical Work up  Expected Discharge Plan and Services In-house Referral: Clinical Social Work Discharge Planning Services: CM Consult Post Acute Care Choice: Skilled Nursing Facility Living arrangements for the past 2 months: Single Family Home                 DME Arranged: N/A DME Agency: NA       HH Arranged: NA HH Agency: NA         Social Determinants of Health (SDOH) Interventions SDOH Screenings   Food Insecurity: No Food Insecurity (07/05/2023)  Housing: Low Risk  (07/05/2023)  Transportation Needs: No Transportation Needs (07/05/2023)  Utilities: Not At Risk (07/05/2023)  Tobacco Use: High Risk (07/10/2023)    Readmission Risk Interventions     No data to display

## 2023-08-08 NOTE — Progress Notes (Signed)
 Speech Language Pathology Treatment: Noelia Batman Speaking valve  Patient Details Name: Frank [name]. MRN: [id number] DOB: 1966-01-08 Today's Date: 08/08/2023 Time: 4098-1191 SLP Time Calculation (min) (ACUTE ONLY): 16 min  Assessment / Plan / Recommendation Clinical Impression  Pt demonstrates improved sustained tolerance of PMSV at rest today though mentation is worse. Pt wore PMSV for 10 minutes with no change in vital signs, 2 instance of cough without blowing off valve. No sign of back pressure. Pt did have some spontaneous phonation during a cough and a sigh, but SLP could not successfully cue pt to phonate volitionally. Pt to continue with PMSV with SLP only until mentation improved some more. Today pt had a fever.   HPI HPI: 58 y.o. male transferred from [facility] 07/03/23 to [facility] for management of newly diagnosed plasma cell myeloma with weakness and AMS. Pt also with AKI and metabolic encephalopathy. 5/10 transfer to [facility] for iHD.Bedside swallow eval 4/18 wiht recs for NPO due to somnolence. 4/22 Intubated and chemo initiated. 4/23-5/10 CRRT. 5/6 IVIG started. 5/8 trach. 5/12 iHD initiated. 5/18 inability to oxygenate through trach, intubated and trach revision. PMHx:Lt THA, carpal tunnel syndrome, Lt TKA, Lt humerus fx, PAF, HTN, HLD, bipolar disorder, obesity, T2DM      SLP Plan  Continue with current plan of care      Recommendations for follow up therapy are one component of a multi-disciplinary discharge planning process, led by the attending physician.  Recommendations may be updated based on patient status, additional functional criteria and insurance authorization.    Recommendations         Patient may use Passy-Muir Speech Valve: with SLP only PMSV Supervision: Full MD: Please consider changing trach tube to : Cuffless           Oral care QID           Continue with current plan of care     Dominique Calvey, Hardin Leys  08/08/2023, 1:41 PM

## 2023-08-08 NOTE — Progress Notes (Signed)
 HD Note:  Some information was entered later than the data was gathered due to patient care needs. The stated time with the data is accurate.  Received patient in bed to unit.   Alert and oriented. On HHTC.  Informed consent signed and in chart.   Access used: Right internal jugular TDC Access issues: None  Patient tolerated treatment well.   TX duration: 3.5 hours  Alert, without acute distress.  Total UF removed: 1.5L  Hand-off given to patient's nurse.   Transported back to the room   Barron Lien RN Kidney Dialysis Unit.

## 2023-08-08 NOTE — Progress Notes (Signed)
 PROGRESS NOTE    Frank [name].  ZOX:[id number] DOB: 1965/08/07 DOA: 07/03/2023 PCP: Serita Danes, MD    Brief Narrative:  Patient with history of obstructive sleep apnea on CPAP, hypertension,  hyperlipidemia and asthma who had been feeling weak for about 1 week, admitted to [facility] with concern for sepsis, community-acquired pneumonia and acute encephalopathy.  Admitted and treated with antibiotics.  Workup revealed lytic bony lesions with concern for multiple myeloma.  Transfusions done.  Patient developed acute renal failure and is currently on hemodialysis.  Admitted to ICU with following significant events. Patient underwent tracheostomy placement and now on trach collar. Patient is still on NG tube feeding. Patient is on dialysis now.  He is still encephalopathic.  4/17-admitted to ICU intubated, encephalopathic. 4/20 atrial fibrillation with RVR 4/21 nosebleed after IR NG attempt, NG placed by PCCM in afternoon, stable transferred to [facility] 4/22 intubated; some abla requiring prbcs; ct abd/pelvis showing acute groin hematoma tracking from posterior presumed from site of bone marrow biopsy; heparin  held EEG 4/25 >> moderate to severe encephalopathy without any seizures or epileptiform discharges 4/23 respiratory culture >> MRSE 4/28 remains intubated on CRRT, down to one pressor  4/29 labile BP, NE back up. Responded a bit to fluids. Received Velcade  and Cytoxan  chemotherapy for newly diagnosed multiple myeloma. 5/1 remains encephalopathic 5/2 LP and ETT exchange  5/5 1 PRBC. Palliative care met w family  5/6 IVIG. plan for trach established. Added midodrine  after failed vaso wean. Abx/fungal changed to levaquin   5/7 weaning pressors incr midodrine  incr lactulose . Vaso off  5/8 Trach placed. Some post op bleeding improved with thrombi-pad 5/10 voriconazole  added back based on CT Chest findings, MRI brain with no acute changes 5/13 more alert and able to follow some  commands. 5/19 and had been doing well on trach collar, and was on the verge of transferring to progressive when had a trach dislodgment. 5/22 more awake. 5/23, transferred to floor.  Micro: 4/28 BCx > no growth, final 4/30 respiratory culture-few PMN, rare budding yeast, abundant GPC> few aspergillus fumigatus Quantiferon 5/2 > neg Trach asp 5/4> rare GPC, rare mold  PCP smear trach asp 5/5 > neg  Trach asp 5/9 > abundant staph hemolyticus, few budding yeast Trach aspirate 5/15 - ngtd Blood cultures 5/11-no growth   CSF studies 1 RBC, 1 WBC, glucose 106 CSF culture-2 organisms acute, rare WBC> NGTD Cryptococcal antigen negative meningitis and encephalitis panel negative VDRL  non-reactive    XR left arm consistent with ORIF of pathologic fracture.   Subjective: Patient seen and examined.  Barely responding.  He was able to use his right upper extremity and left lower extremity more prominently than left upper extremity and right lower extremity.  He was following simple commands but unable to voice any words. Remains with NG tube feeding. On trach collar. Temperature 102 last 24 hours. No family at bedside.    Assessment & Plan:   Acute hypoxic respiratory failure and respiratory insufficiency status post tracheostomy:  Patient currently remains on trach collar.  And overall poor health.  Continue aggressive chest physiotherapy.  Pulmonary following.  Severe acute metabolic encephalopathy with hypoactive delirium, seizure disorder: Mental status waxing and waning.  MRI of the brain normal.  LP without evidence of meningitis or encephalitis. Patient underwent EEG.  Without evidence of seizure.  Not on antiepileptics.  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  ID following.  Planning for prolonged therapies.  Multiple myeloma: Started on Velcade  and Cytoxan .  Followed by oncology.  Remains in poor clinical status.  Myeloma kidney, acute kidney injury on chronic  kidney disease stage IIIa: Baseline creatinine 1.1-1.5.  Creatinine 5.8 here.  AKI due to myeloma kidney and hypercalcemia, ACE inhibitors. CRRT/23.  Now on dialysis.  Tunneled catheter placed 5/22.  Paroxysmal A-fib, now in sinus rhythm.  Rate controlled.  Not on any rate control medications.  Unable to anticoagulate due to hematoma and intolerance.  Type 2 diabetes: On insulin  coverage.  Obstructive sleep apnea: Now with trach collar.  Goal of care: Followed by palliative care.  Remains in very poor clinical status.  Currently desires to continue all the treatments. Patient has core track tube improved since last 31 days. He will likely need PEG tube.      DVT prophylaxis: heparin  injection 5,000 Units Start: 07/25/23 0600 Place and maintain sequential compression device Start: 07/14/23 1033   Code Status: Full code Family Communication: None at the bedside Disposition Plan: Status is: Inpatient Remains inpatient appropriate because: Critically ill     Consultants:  Critical care ID Nephrology Oncology Palliative care  Procedures:  Multiple procedures as above  Antimicrobials:  Multiple antibiotics.  Currently on voriconazole .     Objective: Vitals:   08/08/23 0520 08/08/23 0612 08/08/23 0741 08/08/23 1147  BP: (!) 123/94     Pulse: (!) 116     Resp: (!) 21     Temp: (!) 100.7 F (38.2 C)  98.8 F (37.1 C) (!) 102.2 F (39 C)  TempSrc: Oral  Oral Oral  SpO2: 99%     Weight:  103 kg    Height:        Intake/Output Summary (Last 24 hours) at 08/08/2023 1152 Last data filed at 08/08/2023 0600 Gross per 24 hour  Intake 650 ml  Output 2255 ml  Net -1605 ml   Filed Weights   08/05/23 0500 08/07/23 0708 08/08/23 0612  Weight: 99.5 kg 104.4 kg 103 kg    Examination:  General exam: Sick looking and tremulous. Respiratory system: Clear to auscultation.  On trach collar.  Looks fairly comfortable.  No added sounds.  Poor inspiratory  effort. Cardiovascular system: S1 & S2 heard, RRR.  Gastrointestinal system: Abdomen is nondistended, soft and nontender. No organomegaly or masses felt. Normal bowel sounds heard. Central nervous system: Alert on stimulation.  Follows simple commands including mobilizing extremities.  Does not have any voice because of trach collar. Patient uses right upper extremity and left lower extremity more easily than other extremities. Pressure Injury 07/05/23 Heel Left;Right Unstageable - Full thickness tissue loss in which the base of the injury is covered by slough (yellow, tan, gray, green or brown) and/or eschar (tan, brown or black) in the wound bed. (Active)  07/05/23 1108  Location: Heel  Location Orientation: Left;Right  Staging: Unstageable - Full thickness tissue loss in which the base of the injury is covered by slough (yellow, tan, gray, green or brown) and/or eschar (tan, brown or black) in the wound bed.  Wound Description (Comments):   Present on Admission: Yes     Pressure Injury 07/11/23 Buttocks Mid Unstageable - Full thickness tissue loss in which the base of the injury is covered by slough (yellow, tan, gray, green or brown) and/or eschar (tan, brown or black) in the wound bed. 8 cm x 6 cmx 0.1 cm (Active)  07/11/23 1600  Location: Buttocks  Location Orientation: Mid  Staging: Unstageable - Full thickness tissue loss in which the base of the injury  is covered by slough (yellow, tan, gray, green or brown) and/or eschar (tan, brown or black) in the wound bed.  Wound Description (Comments): 8 cm x 6 cmx 0.1 cm  Present on Admission: No     Pressure Injury 07/27/23 Buttocks Right;Lower (Active)  07/27/23 1700  Location: Buttocks  Location Orientation: Right;Lower  Staging:   Wound Description (Comments):   Present on Admission: Yes  Fecal management system with loose stool.      Data Reviewed: I have personally reviewed following labs and imaging studies  CBC: Recent Labs   Lab 08/01/23 1835 08/02/23 1006 08/03/23 0322 08/06/23 0205  WBC  --  15.2* 11.1* 10.8*  NEUTROABS  --   --   --  7.0  HGB 10.4* 9.8* 8.9* 9.4*  HCT 33.5* 32.4* 29.5* 30.4*  MCV  --  101.6* 101.7* 100.3*  PLT  --  325 330 306   Basic Metabolic Panel: Recent Labs  Lab 08/05/23 0500 08/05/23 1154 08/06/23 0205 08/06/23 0206 08/07/23 0256 08/08/23 0450  NA  --  134* 137 139 136 132*  131*  K  --  3.9 4.2 4.3 4.6 3.4*  3.4*  CL  --  98 98 99 101 96*  96*  CO2  --  24 23 23 22 25  25   GLUCOSE  --  196* 167* 166* 199* 158*  158*  BUN  --  111* 136* 137* 172* 101*  103*  CREATININE  --  4.08* 4.67* 4.55* 4.90* 2.83*  2.86*  CALCIUM   --  8.0* 8.1* 8.2* 7.9* 7.7*  7.6*  MG  --   --   --   --  2.7* 2.5*  PHOS 4.2 RESULTS UNAVAILABLE DUE TO INTERFERING SUBSTANCE  --  3.9 4.5 RESULTS UNAVAILABLE DUE TO INTERFERING SUBSTANCE   GFR: Estimated Creatinine Clearance: 34.9 mL/min (A) (by C-G formula based on SCr of 2.83 mg/dL (H)). Liver Function Tests: Recent Labs  Lab 08/05/23 1154 08/06/23 0205 08/06/23 0206 08/07/23 0256 08/08/23 0450  AST  --  13*  --   --  15  ALT  --  12  --   --  10  ALKPHOS  --  62  --   --  58  BILITOT  --  0.7  --   --  0.5  PROT  --  8.0  --   --  8.8*  ALBUMIN  1.9* 2.0* 2.0* 1.9* 2.2*  2.2*   No results for input(s): "LIPASE", "AMYLASE" in the last 168 hours. No results for input(s): "AMMONIA" in the last 168 hours.  Coagulation Profile: No results for input(s): "INR", "PROTIME" in the last 168 hours. Cardiac Enzymes: No results for input(s): "CKTOTAL", "CKMB", "CKMBINDEX", "TROPONINI" in the last 168 hours. BNP (last 3 results) No results for input(s): "PROBNP" in the last 8760 hours. HbA1C: No results for input(s): "HGBA1C" in the last 72 hours. CBG: Recent Labs  Lab 08/07/23 1110 08/07/23 1632 08/07/23 1959 08/08/23 0020 08/08/23 0742  GLUCAP 190* 168* 180* 188* 235*   Lipid Profile: No results for input(s): "CHOL",  "HDL", "LDLCALC", "TRIG", "CHOLHDL", "LDLDIRECT" in the last 72 hours. Thyroid Function Tests: No results for input(s): "TSH", "T4TOTAL", "FREET4", "T3FREE", "THYROIDAB" in the last 72 hours. Anemia Panel: No results for input(s): "VITAMINB12", "FOLATE", "FERRITIN", "TIBC", "IRON", "RETICCTPCT" in the last 72 hours. Sepsis Labs: No results for input(s): "PROCALCITON", "LATICACIDVEN" in the last 168 hours.  Recent Results (from the past 240 hours)  Culture, Respiratory w Gram Stain  Status: None   Collection Time: 07/31/23  9:38 AM   Specimen: Tracheal Aspirate; Respiratory  Result Value Ref Range Status   Specimen Description TRACHEAL ASPIRATE  Final   Special Requests NONE  Final   Gram Stain   Final    RARE WBC PRESENT, PREDOMINANTLY PMN NO ORGANISMS SEEN    Culture   Final    FEW Normal respiratory flora-no Staph aureus or Pseudomonas seen Performed at [facility], [address] [place], [place] [address]    Report Status 08/02/2023 FINAL  Final         Radiology Studies: IR Fluoro Guide CV Line Right Result Date: 08/07/2023 INDICATION: 58 year old male with history of renal failure requiring central venous access for hemodialysis. EXAM: TUNNELED CENTRAL VENOUS HEMODIALYSIS CATHETER PLACEMENT WITH ULTRASOUND AND FLUOROSCOPIC GUIDANCE MEDICATIONS: Ancef  2 gm IV . The antibiotic was given in an appropriate time interval prior to skin puncture. ANESTHESIA/SEDATION: Moderate (conscious) sedation was employed during this procedure. A total of Versed  1 mg and Fentanyl  25 mcg was administered intravenously. Moderate Sedation Time: 12 minutes. The patient's level of consciousness and vital signs were monitored continuously by radiology nursing throughout the procedure under my direct supervision. FLUOROSCOPY TIME:  Three mGy reference air kerma COMPLICATIONS: None immediate. PROCEDURE: Informed written consent was obtained from the patient after a discussion of the risks,  benefits, and alternatives to treatment. Questions regarding the procedure were encouraged and answered. The right neck and chest were prepped with chlorhexidine  in a sterile fashion, and a sterile drape was applied covering the operative field. Maximum barrier sterile technique with sterile gowns and gloves were used for the procedure. A timeout was performed prior to the initiation of the procedure. After creating a small venotomy incision, a 21 gauge micropuncture kit was utilized to access the internal jugular vein. Real-time ultrasound guidance was utilized for vascular access including the acquisition of a permanent ultrasound image documenting patency of the accessed vessel. A Rosen wire was advanced to the level of the IVC and the micropuncture sheath was exchanged for an 8 Fr dilator. A 14.5 French tunneled hemodialysis catheter measuring 23 cm from tip to cuff was tunneled in a retrograde fashion from the anterior chest wall to the venotomy incision. Serial dilation was then performed an a peel-away sheath was placed. The catheter was then placed through the peel-away sheath with the catheter tip ultimately positioned within the right atrium. Final catheter positioning was confirmed and documented with a spot radiographic image. The catheter aspirates and flushes normally. The catheter was flushed with appropriate volume heparin  dwells. The catheter exit site was secured with a 0-Silk retention suture. The venotomy incision was closed with Dermabond. Sterile dressings were applied. The patient tolerated the procedure well without immediate post procedural complication. IMPRESSION: Successful placement of 23 cm tip to cuff tunneled hemodialysis catheter via the right internal jugular vein with catheter tip terminating within the right atrium. The catheter is ready for immediate use. Creasie Doctor, MD [facility] Radiology Electronically Signed   By: Creasie Doctor M.D.   On: 08/07/2023 16:25   IR US  Guide Vasc Access Right Result Date: 08/07/2023 INDICATION: 58 year old male with history of renal failure requiring central venous access for hemodialysis. EXAM: TUNNELED CENTRAL VENOUS HEMODIALYSIS CATHETER PLACEMENT WITH ULTRASOUND AND FLUOROSCOPIC GUIDANCE MEDICATIONS: Ancef  2 gm IV . The antibiotic was given in an appropriate time interval prior to skin puncture. ANESTHESIA/SEDATION: Moderate (conscious) sedation was employed during this procedure.  A total of Versed  1 mg and Fentanyl  25 mcg was administered intravenously. Moderate Sedation Time: 12 minutes. The patient's level of consciousness and vital signs were monitored continuously by radiology nursing throughout the procedure under my direct supervision. FLUOROSCOPY TIME:  Three mGy reference air kerma COMPLICATIONS: None immediate. PROCEDURE: Informed written consent was obtained from the patient after a discussion of the risks, benefits, and alternatives to treatment. Questions regarding the procedure were encouraged and answered. The right neck and chest were prepped with chlorhexidine  in a sterile fashion, and a sterile drape was applied covering the operative field. Maximum barrier sterile technique with sterile gowns and gloves were used for the procedure. A timeout was performed prior to the initiation of the procedure. After creating a small venotomy incision, a 21 gauge micropuncture kit was utilized to access the internal jugular vein. Real-time ultrasound guidance was utilized for vascular access including the acquisition of a permanent ultrasound image documenting patency of the accessed vessel. A Rosen wire was advanced to the level of the IVC and the micropuncture sheath was exchanged for an 8 Fr dilator. A 14.5 French tunneled hemodialysis catheter measuring 23 cm from tip to cuff was tunneled in a retrograde fashion from the anterior chest wall to the venotomy incision. Serial dilation was then  performed an a peel-away sheath was placed. The catheter was then placed through the peel-away sheath with the catheter tip ultimately positioned within the right atrium. Final catheter positioning was confirmed and documented with a spot radiographic image. The catheter aspirates and flushes normally. The catheter was flushed with appropriate volume heparin  dwells. The catheter exit site was secured with a 0-Silk retention suture. The venotomy incision was closed with Dermabond. Sterile dressings were applied. The patient tolerated the procedure well without immediate post procedural complication. IMPRESSION: Successful placement of 23 cm tip to cuff tunneled hemodialysis catheter via the right internal jugular vein with catheter tip terminating within the right atrium. The catheter is ready for immediate use. Creasie Doctor, MD [facility] Radiology Electronically Signed   By: Creasie Doctor M.D.   On: 08/07/2023 16:25        Scheduled Meds:  arformoterol   15 mcg Nebulization BID   busPIRone   15 mg Per Tube QHS   collagenase   Topical Daily   feeding supplement (PROSource TF20)  60 mL Per Tube BID   folic acid   1 mg Per Tube Daily   heparin  injection (subcutaneous)  5,000 Units Subcutaneous Q8H   heparin  sodium (porcine)  4,000 Units Intravenous to XRAY   insulin  aspart  0-20 Units Subcutaneous Q4H   insulin  aspart  4 Units Subcutaneous Q4H   insulin  glargine-yfgn  8 Units Subcutaneous BID   lidocaine   20 mL Intradermal to XRAY   multivitamin  1 tablet Per Tube QHS   nicotine   7 mg Transdermal Daily   nutrition supplement (JUVEN)  1 packet Per Tube BID BM   mouth rinse  15 mL Mouth Rinse Q2H   PARoxetine   40 mg Per Tube Daily   pneumococcal 20-valent conjugate vaccine  0.5 mL Intramuscular Tomorrow-1000   revefenacin   175 mcg Nebulization Daily   sodium chloride  flush  10-40 mL Intracatheter Q12H   sodium chloride  flush  10-40 mL  Intracatheter Q12H   thiamine   100 mg Per Tube Daily   voriconazole   350 mg Per Tube Q12H   Continuous Infusions:  feeding supplement (VITAL 1.5 CAL) 1,000 mL (08/08/23 0631)  LOS: 36 days    Time spent: 55 minutes    Vada Garibaldi, MD [facility]

## 2023-08-08 NOTE — Progress Notes (Signed)
 [facility] NEPHROLOGY PROGRESS NOTE   Subjective:  Transferred out of ICU.  S/p  tunneled catheter 5/22 Working with therapy and somewhat participatory S/p HD yesterday as well   Receiving chemotherapy per oncology   Objective Vital signs in last 24 hours: Vitals:   07/26/23 1100 07/26/23 1125 07/26/23 1130 07/26/23 1200  BP: (!) 179/96 (!) 146/85 115/72 (!) 83/50  Pulse: (!) 105 96 93 76  Resp: 16 20 20 20   Temp:    99.8 F (37.7 C)  TempSrc:    Axillary  SpO2: 100% 98% 98% 99%  Weight:      Height:        Physical Exam: General: Critically ill looking male, on trach collar-  not responsive to me this AM-  looks but no commands Heart:RRR, s1s2 nl Lungs: clear ant/ lat Abdomen:soft, non-tender, non-distended Extremities: no pitting edema, LUE distortion is broken bone not edema Neurology: responding minimally    Assessment/ Plan: Pt is a 58 y.o. yo male with past medical history significant for HTN, HLD, type II DM, BPH, seizure who was initially presented at [facility] due to AMS, admitted for sepsis due to pneumonia, encephalopathy, sepsis, AKI, hypercalcemia, symptomatic anemia and lytic lesion concern for multiple myeloma.  Problems # Oliguric AKI on CKD 3a - b/l creat 1.1- 1.5 from 2024. Creat here 5.8 on presentation at OSH. AKI due to myeloma kidney +/- hyperCa +/- ACEi. CRRT from 4/23--- 5/10. Is on iHD now.   dialysis 5/22 following placement  tunneled HD catheter-  ended up getting in the middle of the night.. plan for next on 5/24-  keep on TTS schedule  - is making some urine but not clearing yet-  will check labs prior to receiving HD in the AM just to make sure he still needs   # AMS/acute metabolic encephalopathy: MRI, LP completed and unrevealing.  Waxes and wanes  #Volume - appears euvolemic.  Gentle UF moving forward  # Hypotension:Not on pressors with stable blood pressures, normotensive, resolved.  # Multiple myeloma:  started on Velcade  and Cytoxan , #1 4/22 and #2 4/25. Labs showed good response, last checked 07/22/23.  Resuming chemotherapy currently.  #AHRF/ aspergillus PNA/ s/p trach 5/8: on voriconazole  IV, off all other IV antibiotics. Off the ventilator on trach collar, per CCM  # Atrial fib-  on amio  #Anemia - prn transfusions. On epogen  per onc.   # Persistent fevers with negative infectious workup thought to be related to malignancy  Reche Canales, MD  07/26/2023, 1:31 PM  Recent Labs  Lab 07/24/23 0421 07/24/23 1630 07/25/23 1508 07/26/23 0423  HGB 8.9*  --   --  8.9*  ALBUMIN  1.6*   < > 1.6* 1.8*  1.6*  CALCIUM  7.4*   < > 7.7* 7.7*  7.7*  PHOS 2.0*  1.5*   < > RESULTS UNAVAILABLE DUE TO INTERFERING SUBSTANCE 1.9*  1.5*  CREATININE 1.59*   < > 1.62* 1.77*  1.74*  K 4.3   < > 4.6 4.4  4.5   < > = values in this interval not displayed.    Inpatient medications:  amiodarone   200 mg Per NG tube Daily   arformoterol   15 mcg Nebulization BID   busPIRone   15 mg Per Tube QHS   clonazePAM   1 mg Per Tube BID   docusate  100 mg Per Tube BID   epoetin  alfa  40,000 Units Subcutaneous QODAY   famotidine   10.4 mg Per Tube  Daily   feeding supplement (PROSource TF20)  60 mL Per Tube TID   folic acid   1 mg Per Tube Daily   gabapentin   100 mg Per Tube Q12H   heparin  injection (subcutaneous)  5,000 Units Subcutaneous Q8H   insulin  aspart  0-20 Units Subcutaneous Q4H   insulin  aspart  8 Units Subcutaneous Q4H   insulin  glargine-yfgn  10 Units Subcutaneous BID   lactulose   20 g Per Tube TID   leptospermum manuka honey  1 Application Topical Daily   lidocaine -EPINEPHrine   20 mL Intradermal Once   midodrine   10 mg Per Tube TID WC   nicotine   7 mg Transdermal Daily   mouth rinse  15 mL Mouth Rinse Q2H   PARoxetine   40 mg Per Tube Daily   pneumococcal 20-valent conjugate vaccine  0.5 mL Intramuscular Tomorrow-1000   polyethylene glycol  17 g Per Tube Daily   QUEtiapine   50 mg Per  Tube QHS   revefenacin   175 mcg Nebulization Daily   sodium chloride  flush  10-40 mL Intracatheter Q12H   sodium chloride  HYPERTONIC  4 mL Nebulization Daily   thiamine   100 mg Per Tube Daily    ampicillin -sulbactam (UNASYN ) IV Stopped (07/26/23 1478)   dexmedetomidine  (PRECEDEX ) IV infusion 1.2 mcg/kg/hr (07/26/23 1300)   feeding supplement (VITAL 1.5 CAL) 60 mL/hr at 07/26/23 1300   fentaNYL  infusion INTRAVENOUS 200 mcg/hr (07/26/23 1300)   levofloxacin  (LEVAQUIN ) IV 100 mL/hr at 07/26/23 1300   midazolam      norepinephrine  (LEVOPHED ) Adult infusion 2 mcg/min (07/26/23 1300)   sodium PHOSPHATE  IVPB (in mmol)     voriconazole  Stopped (07/26/23 1225)   Followed by   [name] ON 07/27/2023] voriconazole      acetaminophen  (TYLENOL ) oral liquid 160 mg/5 mL **OR** acetaminophen , fentaNYL , ipratropium-albuterol , midazolam , ondansetron  (ZOFRAN ) IV, mouth rinse, oxyCODONE , polyethylene glycol, polyvinyl alcohol , sodium chloride  flush

## 2023-08-08 NOTE — Plan of Care (Signed)
  Problem: Education: Goal: Knowledge of General Education information will improve Description: Including pain rating scale, medication(s)/side effects and non-pharmacologic comfort measures Outcome: Progressing   Problem: Health Behavior/Discharge Planning: Goal: Ability to manage health-related needs will improve Outcome: Progressing   Problem: Clinical Measurements: Goal: Ability to maintain clinical measurements within normal limits will improve Outcome: Progressing Goal: Will remain free from infection Outcome: Progressing Goal: Diagnostic test results will improve Outcome: Progressing Goal: Respiratory complications will improve Outcome: Progressing Goal: Cardiovascular complication will be avoided Outcome: Progressing   Problem: Activity: Goal: Risk for activity intolerance will decrease Outcome: Progressing   Problem: Nutrition: Goal: Adequate nutrition will be maintained Outcome: Progressing   Problem: Coping: Goal: Level of anxiety will decrease Outcome: Progressing   Problem: Elimination: Goal: Will not experience complications related to bowel motility Outcome: Progressing Goal: Will not experience complications related to urinary retention Outcome: Progressing   Problem: Pain Managment: Goal: General experience of comfort will improve and/or be controlled Outcome: Progressing   Problem: Safety: Goal: Ability to remain free from injury will improve Outcome: Progressing   Problem: Skin Integrity: Goal: Risk for impaired skin integrity will decrease Outcome: Progressing   Problem: Education: Goal: Ability to describe self-care measures that may prevent or decrease complications (Diabetes Survival Skills Education) will improve Outcome: Progressing Goal: Individualized Educational Video(s) Outcome: Progressing   Problem: Coping: Goal: Ability to adjust to condition or change in health will improve Outcome: Progressing   Problem: Fluid  Volume: Goal: Ability to maintain a balanced intake and output will improve Outcome: Progressing   Problem: Health Behavior/Discharge Planning: Goal: Ability to identify and utilize available resources and services will improve Outcome: Progressing Goal: Ability to manage health-related needs will improve Outcome: Progressing   Problem: Metabolic: Goal: Ability to maintain appropriate glucose levels will improve Outcome: Progressing   Problem: Nutritional: Goal: Maintenance of adequate nutrition will improve Outcome: Progressing Goal: Progress toward achieving an optimal weight will improve Outcome: Progressing   Problem: Skin Integrity: Goal: Risk for impaired skin integrity will decrease Outcome: Progressing   Problem: Tissue Perfusion: Goal: Adequacy of tissue perfusion will improve Outcome: Progressing   Problem: Activity: Goal: Ability to tolerate increased activity will improve Outcome: Progressing   Problem: Respiratory: Goal: Ability to maintain a clear airway and adequate ventilation will improve Outcome: Progressing   Problem: Role Relationship: Goal: Method of communication will improve Outcome: Progressing   Problem: Education: Goal: Knowledge about tracheostomy care/management will improve Outcome: Progressing   Problem: Activity: Goal: Ability to tolerate increased activity will improve Outcome: Progressing   Problem: Health Behavior/Discharge Planning: Goal: Ability to manage tracheostomy will improve Outcome: Progressing   Problem: Respiratory: Goal: Patent airway maintenance will improve Outcome: Progressing   Problem: Role Relationship: Goal: Ability to communicate will improve Outcome: Progressing   Problem: Education: Goal: Knowledge of disease and its progression will improve Outcome: Progressing Goal: Individualized Educational Video(s) Outcome: Progressing

## 2023-08-08 NOTE — Progress Notes (Signed)
 Patient transported to HD on 6L trach collar.

## 2023-08-08 NOTE — Progress Notes (Signed)
 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

## 2023-08-09 ENCOUNTER — Inpatient Hospital Stay (HOSPITAL_COMMUNITY)

## 2023-08-09 DIAGNOSIS — R531 Weakness: Secondary | ICD-10-CM | POA: Diagnosis not present

## 2023-08-09 LAB — COMPREHENSIVE METABOLIC PANEL WITH GFR
ALT: 7 U/L (ref 0–44)
AST: 15 U/L (ref 15–41)
Albumin: 2.2 g/dL — ABNORMAL LOW (ref 3.5–5.0)
Alkaline Phosphatase: 47 U/L (ref 38–126)
Anion gap: 16 — ABNORMAL HIGH (ref 5–15)
BUN: 165 mg/dL — ABNORMAL HIGH (ref 6–20)
CO2: 21 mmol/L — ABNORMAL LOW (ref 22–32)
Calcium: 7.6 mg/dL — ABNORMAL LOW (ref 8.9–10.3)
Chloride: 100 mmol/L (ref 98–111)
Creatinine, Ser: 4.1 mg/dL — ABNORMAL HIGH (ref 0.61–1.24)
GFR, Estimated: 16 mL/min — ABNORMAL LOW (ref >60)
Glucose, Bld: 211 mg/dL — ABNORMAL HIGH (ref 70–99)
Potassium: 4.7 mmol/L (ref 3.5–5.1)
Sodium: 137 mmol/L (ref 135–145)
Total Bilirubin: 1 mg/dL (ref 0.0–1.2)
Total Protein: 8.1 g/dL (ref 6.5–8.1)

## 2023-08-09 LAB — GLUCOSE, CAPILLARY
Glucose-Capillary: 174 mg/dL — ABNORMAL HIGH (ref 70–99)
Glucose-Capillary: 210 mg/dL — ABNORMAL HIGH (ref 70–99)
Glucose-Capillary: 217 mg/dL — ABNORMAL HIGH (ref 70–99)
Glucose-Capillary: 229 mg/dL — ABNORMAL HIGH (ref 70–99)
Glucose-Capillary: 235 mg/dL — ABNORMAL HIGH (ref 70–99)

## 2023-08-09 LAB — CBC WITH DIFFERENTIAL/PLATELET
Abs Immature Granulocytes: 0.06 10*3/uL (ref 0.00–0.07)
Basophils Absolute: 0 10*3/uL (ref 0.0–0.1)
Basophils Relative: 0 %
Eosinophils Absolute: 0 10*3/uL (ref 0.0–0.5)
Eosinophils Relative: 0 %
HCT: 33.4 % — ABNORMAL LOW (ref 39.0–52.0)
Hemoglobin: 10.1 g/dL — ABNORMAL LOW (ref 13.0–17.0)
Immature Granulocytes: 1 %
Lymphocytes Relative: 19 %
Lymphs Abs: 1.6 10*3/uL (ref 0.7–4.0)
MCH: 31.6 pg (ref 26.0–34.0)
MCHC: 30.2 g/dL (ref 30.0–36.0)
MCV: 104.4 fL — ABNORMAL HIGH (ref 80.0–100.0)
Monocytes Absolute: 1.3 10*3/uL — ABNORMAL HIGH (ref 0.1–1.0)
Monocytes Relative: 14 %
Neutro Abs: 5.7 10*3/uL (ref 1.7–7.7)
Neutrophils Relative %: 66 %
Platelets: 284 10*3/uL (ref 150–400)
RBC: 3.2 MIL/uL — ABNORMAL LOW (ref 4.22–5.81)
RDW: 23.5 % — ABNORMAL HIGH (ref 11.5–15.5)
WBC: 8.7 10*3/uL (ref 4.0–10.5)
nRBC: 0.5 % — ABNORMAL HIGH (ref 0.0–0.2)

## 2023-08-09 LAB — LACTATE DEHYDROGENASE: LDH: 349 U/L — ABNORMAL HIGH (ref 98–192)

## 2023-08-09 LAB — MAGNESIUM: Magnesium: 2.6 mg/dL — ABNORMAL HIGH (ref 1.7–2.4)

## 2023-08-09 LAB — PHOSPHORUS: Phosphorus: 4 mg/dL (ref 2.5–4.6)

## 2023-08-09 MED ORDER — ACYCLOVIR 200 MG PO CAPS
200.0000 mg | ORAL_CAPSULE | Freq: Once | ORAL | Status: AC
  Administered 2023-08-09: 200 mg
  Filled 2023-08-09: qty 1

## 2023-08-09 MED ORDER — VALPROATE SODIUM 100 MG/ML IV SOLN
250.0000 mg | Freq: Two times a day (BID) | INTRAVENOUS | Status: DC
  Administered 2023-08-10 (×2): 250 mg via INTRAVENOUS
  Filled 2023-08-09 (×3): qty 2.5
  Filled 2023-08-09: qty 250

## 2023-08-09 MED ORDER — ACETAMINOPHEN 650 MG RE SUPP
650.0000 mg | RECTAL | Status: DC | PRN
  Administered 2023-08-14: 650 mg via RECTAL
  Filled 2023-08-09: qty 1

## 2023-08-09 MED ORDER — DIVALPROEX SODIUM 125 MG PO CSDR
250.0000 mg | DELAYED_RELEASE_CAPSULE | Freq: Two times a day (BID) | ORAL | Status: DC
  Filled 2023-08-09 (×2): qty 2

## 2023-08-09 MED ORDER — ACETAMINOPHEN 160 MG/5ML PO SOLN
650.0000 mg | ORAL | Status: DC | PRN
  Administered 2023-08-09 – 2023-09-09 (×15): 650 mg
  Filled 2023-08-09 (×15): qty 20.3

## 2023-08-09 NOTE — Progress Notes (Signed)
 [facility] NEPHROLOGY PROGRESS NOTE   Subjective   No significant change in clinical status which is eyes open but no commands  Had 1050 of UOP which is an increase but BUN and crt rising off of HD   Objective Vital signs in last 24 hours: Vitals:   07/26/23 1100 07/26/23 1125 07/26/23 1130 07/26/23 1200  BP: (!) 179/96 (!) 146/85 115/72 (!) 83/50  Pulse: (!) 105 96 93 76  Resp: 16 20 20 20   Temp:    99.8 F (37.7 C)  TempSrc:    Axillary  SpO2: 100% 98% 98% 99%  Weight:      Height:        Physical Exam: General: Critically ill looking male, on trach collar-  not responsive to me this AM-  looks but no commands Heart:RRR, s1s2 nl Lungs: clear ant/ lat Abdomen:soft, non-tender, non-distended Extremities: no pitting edema, LUE distortion is broken bone not edema Neurology: responding minimally    Assessment/ Plan: Pt is a 58 y.o. yo male with  HTN, HLD, type II DM, BPH, seizure who was initially presented at [facility] due to AMS, admitted for sepsis due to pneumonia, encephalopathy, sepsis, AKI, hypercalcemia, symptomatic anemia and lytic lesion concern for multiple myeloma.  Problems # Oliguric AKI on CKD 3a - b/l creat 1.1- 1.5 from 2024. Creat here 5.8 on presentation. AKI due to myeloma kidney +/- hyperCa +/- ACEi. CRRT from 4/23--- 5/10.  on iHD now.   dialysis 5/22 following placement  tunneled HD catheter-  ended up getting in the middle of the night.. plan for next on 5/24 ( today) -  keep on TTS schedule  - is making some urine but not clearing yet-   is catabolic so might need treatment on Monday as well  # AMS/acute metabolic encephalopathy: MRI, LP completed and unrevealing.  Waxes and wanes  #Volume - appears euvolemic.  Gentle UF moving forward  # Hypotension:Not on pressors with stable blood pressures, normotensive, resolved.  # Multiple myeloma: started on Velcade  and Cytoxan , #1 4/22 and #2 4/25. Labs showed good response,  last checked 07/22/23.  Per onc  #AHRF/ aspergillus PNA/ s/p trach 5/8: on voriconazole  IV, off all other IV antibiotics. Off the ventilator on trach collar, per CCM  # Atrial fib-  on amio  #Anemia - prn transfusions. On epogen  per onc.   # Persistent fevers with negative infectious workup thought to be related to malignancy  Reche Canales, MD  07/26/2023, 1:31 PM    Inpatient medications:  amiodarone   200 mg Per NG tube Daily   arformoterol   15 mcg Nebulization BID   busPIRone   15 mg Per Tube QHS   clonazePAM   1 mg Per Tube BID   docusate  100 mg Per Tube BID   epoetin  alfa  40,000 Units Subcutaneous QODAY   famotidine   10.4 mg Per Tube Daily   feeding supplement (PROSource TF20)  60 mL Per Tube TID   folic acid   1 mg Per Tube Daily   gabapentin   100 mg Per Tube Q12H   heparin  injection (subcutaneous)  5,000 Units Subcutaneous Q8H   insulin  aspart  0-20 Units Subcutaneous Q4H   insulin  aspart  8 Units Subcutaneous Q4H   insulin  glargine-yfgn  10 Units Subcutaneous BID   lactulose   20 g Per Tube TID   leptospermum manuka honey  1 Application Topical Daily   lidocaine -EPINEPHrine   20 mL Intradermal Once   midodrine   10 mg Per  Tube TID WC   nicotine   7 mg Transdermal Daily   mouth rinse  15 mL Mouth Rinse Q2H   PARoxetine   40 mg Per Tube Daily   pneumococcal 20-valent conjugate vaccine  0.5 mL Intramuscular Tomorrow-1000   polyethylene glycol  17 g Per Tube Daily   QUEtiapine   50 mg Per Tube QHS   revefenacin   175 mcg Nebulization Daily   sodium chloride  flush  10-40 mL Intracatheter Q12H   sodium chloride  HYPERTONIC  4 mL Nebulization Daily   thiamine   100 mg Per Tube Daily    ampicillin -sulbactam (UNASYN ) IV Stopped (07/26/23 9147)   dexmedetomidine  (PRECEDEX ) IV infusion 1.2 mcg/kg/hr (07/26/23 1300)   feeding supplement (VITAL 1.5 CAL) 60 mL/hr at 07/26/23 1300   fentaNYL  infusion INTRAVENOUS 200 mcg/hr (07/26/23 1300)   levofloxacin  (LEVAQUIN ) IV 100 mL/hr at  07/26/23 1300   midazolam      norepinephrine  (LEVOPHED ) Adult infusion 2 mcg/min (07/26/23 1300)   sodium PHOSPHATE  IVPB (in mmol)     voriconazole  Stopped (07/26/23 1225)   Followed by   [name] ON 07/27/2023] voriconazole      acetaminophen  (TYLENOL ) oral liquid 160 mg/5 mL **OR** acetaminophen , fentaNYL , ipratropium-albuterol , midazolam , ondansetron  (ZOFRAN ) IV, mouth rinse, oxyCODONE , polyethylene glycol, polyvinyl alcohol , sodium chloride  flush

## 2023-08-09 NOTE — Procedures (Signed)
 HD Note:  Some information was entered later than the data was gathered due to patient care needs. The stated time with the data is accurate.  Received patient in bed to unit.   Alert and oriented.   Informed consent signed and in chart.   Access used: Upper right chest HD catheter Access issues: None  Patient tolerated treatment well.  Patient restless, does not appear to be in pain.  Movement is not accompanied by grimacing.  TX duration: 3 hours  Alert, without acute distress.  Total UF removed: 2000 ml  Hand-off given to patient's nurse.   Transported back to the room   Dawit Tankard L. Alva Jewels, RN [facility].

## 2023-08-09 NOTE — Progress Notes (Signed)
 PROGRESS NOTE    Frank [name].  UJW:[id number] DOB: 1965-07-27 DOA: 07/03/2023 PCP: Serita Danes, MD    Brief Narrative:  Patient with history of obstructive sleep apnea on CPAP, pathological fracture and multiple ORIF left humerus, hypertension,  hyperlipidemia and asthma who had been feeling weak for about 1 week, admitted to [facility], [place] with concern for sepsis, community-acquired pneumonia and acute encephalopathy.  Admitted and treated with antibiotics.  Workup revealed lytic bony lesions with concern for multiple myeloma.  Transfusions done.  Patient developed acute renal failure and is currently on hemodialysis.  Admitted to ICU with following significant events. Patient underwent tracheostomy placement and now on trach collar. Patient is still on NG tube feeding. Patient is on dialysis now.  He is still encephalopathic.  4/17-admitted to ICU intubated, encephalopathic. 4/20 atrial fibrillation with RVR 4/21 nosebleed after IR NG attempt, NG placed by PCCM in afternoon, stable transferred to [facility] 4/22 intubated; some abla requiring prbcs; ct abd/pelvis showing acute groin hematoma tracking from posterior presumed from site of bone marrow biopsy; heparin  held EEG 4/25 >> moderate to severe encephalopathy without any seizures or epileptiform discharges 4/23 respiratory culture >> MRSE 4/28 remains intubated on CRRT, down to one pressor  4/29 labile BP, NE back up. Responded a bit to fluids. Received Velcade  and Cytoxan  chemotherapy for newly diagnosed multiple myeloma. 5/1 remains encephalopathic 5/2 LP and ETT exchange  5/5 1 PRBC. Palliative care met w family  5/6 IVIG. plan for trach established. Added midodrine  after failed vaso wean. Abx/fungal changed to levaquin   5/7 weaning pressors incr midodrine  incr lactulose . Vaso off  5/8 Trach placed. Some post op bleeding improved with thrombi-pad 5/10 voriconazole  added back based on CT Chest findings, MRI brain with no  acute changes 5/13 more alert and able to follow some commands. 5/19 and had been doing well on trach collar, and was on the verge of transferring to progressive when had a trach dislodgment. 5/22 more awake. 5/23, transferred to floor.  Micro: 4/28 BCx > no growth, final 4/30 respiratory culture-few PMN, rare budding yeast, abundant GPC> few aspergillus fumigatus Quantiferon 5/2 > neg Trach asp 5/4> rare GPC, rare mold  PCP smear trach asp 5/5 > neg  Trach asp 5/9 > abundant staph hemolyticus, few budding yeast Trach aspirate 5/15 - ngtd Blood cultures 5/11-no growth   CSF studies 1 RBC, 1 WBC, glucose 106 CSF culture-2 organisms acute, rare WBC> NGTD Cryptococcal antigen negative meningitis and encephalitis panel negative VDRL  non-reactive    XR left arm consistent with ORIF of pathologic fracture.  Extensive bone loss.  Subjective: Patient seen and examined.  Remains mostly altered, sometimes tremulous.  Tries to follow simple commands, blinks eyes on command.  Remains on trach collar. Temperature 103.    Assessment & Plan:   Acute hypoxic respiratory failure and respiratory insufficiency status post tracheostomy:  Patient currently remains on trach collar.  overall poor health.  Continue aggressive chest physiotherapy.  Pulmonary following.  Severe acute metabolic encephalopathy with hypoactive delirium, seizure disorder: Mental status waxing and waning.  MRI of the brain normal.  LP without evidence of meningitis or encephalitis. Patient underwent EEG.  Without evidence of seizure.  Not on antiepileptics. Previous history of seizure on antiepileptics at home.  Repeat EEG today. Encephalopathy is likely secondary to multiple factors, ongoing high fever.  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  ID following.  Planning for prolonged therapies.  Also added acyclovir.  Multiple myeloma: Started on Velcade   and Cytoxan .  Followed by oncology.  He may be treatable for  multiple myeloma, however his general condition is grave.  Myeloma kidney, acute kidney injury on chronic kidney disease stage IIIa: Baseline creatinine 1.1-1.5.  Creatinine 5.8 here.  AKI due to myeloma kidney and hypercalcemia, ACE inhibitors. CRRT/23.  Now on dialysis.  Tunneled catheter placed 5/22.  For dialysis today.  Paroxysmal A-fib, now in sinus rhythm.  Rate controlled.  Not on any rate control medications.  Unable to anticoagulate due to hematoma and intolerance.  Type 2 diabetes: On insulin  coverage.  Obstructive sleep apnea: Now with trach collar.  Goal of care: Followed by palliative care.  Remains in very poor clinical status.  Currently desires to continue all the treatments. Patient has core track tube improved since last 31 days. He will likely need PEG tube however remains in poor clinical status to go for procedure.      DVT prophylaxis: heparin  injection 5,000 Units Start: 07/25/23 0600 Place and maintain sequential compression device Start: 07/14/23 1033   Code Status: Full code Family Communication: Wife called and updated 5/23. Disposition Plan: Status is: Inpatient Remains inpatient appropriate because: Critically ill     Consultants:  Critical care ID Nephrology Oncology Palliative care  Procedures:  Multiple procedures as above  Antimicrobials:  Multiple antibiotics.  Currently on voriconazole    And acyclovir.     Objective: Vitals:   08/09/23 0347 08/09/23 0409 08/09/23 0616 08/09/23 0735  BP:      Pulse: (!) 115     Resp: (!) 24     Temp:   (!) 100.8 F (38.2 C) (!) 103 F (39.4 C)  TempSrc:   Axillary Oral  SpO2: 97%     Weight:  93.5 kg    Height:        Intake/Output Summary (Last 24 hours) at 08/09/2023 1110 Last data filed at 08/09/2023 0730 Gross per 24 hour  Intake 1575.67 ml  Output 1120 ml  Net 455.67 ml   Filed Weights   08/07/23 0708 08/08/23 0612 08/09/23 0409  Weight: 104.4 kg 103 kg 93.5 kg     Examination:  General exam: Sick looking and tremulous.  Looks anxious and uncomfortable at times. Respiratory system: Clear to auscultation.  On trach collar.  No added sounds.  Poor inspiratory effort. Cardiovascular system: S1 & S2 heard, RRR.  Gastrointestinal system: Abdomen is nondistended, soft and nontender. No organomegaly or masses felt. Normal bowel sounds heard. Central nervous system: Alert on stimulation.  Follows simple commands including mobilizing extremities.  Does not have any voice because of trach collar. Patient uses right upper extremity and left lower extremity. Deformity and swelling left arm consistent with previous ORIF.  Restricted mobility. Pressure Injury 07/05/23 Heel Left;Right Unstageable - Full thickness tissue loss in which the base of the injury is covered by slough (yellow, tan, gray, green or brown) and/or eschar (tan, brown or black) in the wound bed. (Active)  07/05/23 1108  Location: Heel  Location Orientation: Left;Right  Staging: Unstageable - Full thickness tissue loss in which the base of the injury is covered by slough (yellow, tan, gray, green or brown) and/or eschar (tan, brown or black) in the wound bed.  Wound Description (Comments):   Present on Admission: Yes     Pressure Injury 07/11/23 Buttocks Mid Unstageable - Full thickness tissue loss in which the base of the injury is covered by slough (yellow, tan, gray, green or brown) and/or eschar (tan, brown or black) in  the wound bed. 8 cm x 6 cmx 0.1 cm (Active)  07/11/23 1600  Location: Buttocks  Location Orientation: Mid  Staging: Unstageable - Full thickness tissue loss in which the base of the injury is covered by slough (yellow, tan, gray, green or brown) and/or eschar (tan, brown or black) in the wound bed.  Wound Description (Comments): 8 cm x 6 cmx 0.1 cm  Present on Admission: No     Pressure Injury 07/27/23 Buttocks Right;Lower (Active)  07/27/23 1700  Location: Buttocks   Location Orientation: Right;Lower  Staging:   Wound Description (Comments):   Present on Admission: Yes  Fecal management system with loose stool.      Data Reviewed: I have personally reviewed following labs and imaging studies  CBC: Recent Labs  Lab 08/03/23 0322 08/06/23 0205 08/09/23 0719  WBC 11.1* 10.8* 8.7  NEUTROABS  --  7.0 5.7  HGB 8.9* 9.4* 10.1*  HCT 29.5* 30.4* 33.4*  MCV 101.7* 100.3* 104.4*  PLT 330 306 284   Basic Metabolic Panel: Recent Labs  Lab 08/05/23 1154 08/06/23 0205 08/06/23 0206 08/07/23 0256 08/08/23 0450 08/09/23 0719  NA 134* 137 139 136 132*  131* 137  K 3.9 4.2 4.3 4.6 3.4*  3.4* 4.7  CL 98 98 99 101 96*  96* 100  CO2 24 23 23 22 25  25  21*  GLUCOSE 196* 167* 166* 199* 158*  158* 211*  BUN 111* 136* 137* 172* 101*  103* 165*  CREATININE 4.08* 4.67* 4.55* 4.90* 2.83*  2.86* 4.10*  CALCIUM  8.0* 8.1* 8.2* 7.9* 7.7*  7.6* 7.6*  MG  --   --   --  2.7* 2.5* 2.6*  PHOS RESULTS UNAVAILABLE DUE TO INTERFERING SUBSTANCE  --  3.9 4.5 RESULTS UNAVAILABLE DUE TO INTERFERING SUBSTANCE 4.0   GFR: Estimated Creatinine Clearance: 23 mL/min (A) (by C-G formula based on SCr of 4.1 mg/dL (H)). Liver Function Tests: Recent Labs  Lab 08/06/23 0205 08/06/23 0206 08/07/23 0256 08/08/23 0450 08/09/23 0719  AST 13*  --   --  15 15  ALT 12  --   --  10 7  ALKPHOS 62  --   --  58 47  BILITOT 0.7  --   --  0.5 1.0  PROT 8.0  --   --  8.8* 8.1  ALBUMIN  2.0* 2.0* 1.9* 2.2*  2.2* 2.2*   No results for input(s): "LIPASE", "AMYLASE" in the last 168 hours. No results for input(s): "AMMONIA" in the last 168 hours.  Coagulation Profile: No results for input(s): "INR", "PROTIME" in the last 168 hours. Cardiac Enzymes: No results for input(s): "CKTOTAL", "CKMB", "CKMBINDEX", "TROPONINI" in the last 168 hours. BNP (last 3 results) No results for input(s): "PROBNP" in the last 8760 hours. HbA1C: No results for input(s): "HGBA1C" in the last 72  hours. CBG: Recent Labs  Lab 08/08/23 1530 08/08/23 2031 08/08/23 2338 08/09/23 0320 08/09/23 0732  GLUCAP 184* 251* 201* 174* 217*   Lipid Profile: No results for input(s): "CHOL", "HDL", "LDLCALC", "TRIG", "CHOLHDL", "LDLDIRECT" in the last 72 hours. Thyroid Function Tests: No results for input(s): "TSH", "T4TOTAL", "FREET4", "T3FREE", "THYROIDAB" in the last 72 hours. Anemia Panel: No results for input(s): "VITAMINB12", "FOLATE", "FERRITIN", "TIBC", "IRON", "RETICCTPCT" in the last 72 hours. Sepsis Labs: No results for input(s): "PROCALCITON", "LATICACIDVEN" in the last 168 hours.  Recent Results (from the past 240 hours)  Culture, Respiratory w Gram Stain     Status: None   Collection Time: 07/31/23  9:38 AM   Specimen: Tracheal Aspirate; Respiratory  Result Value Ref Range Status   Specimen Description TRACHEAL ASPIRATE  Final   Special Requests NONE  Final   Gram Stain   Final    RARE WBC PRESENT, PREDOMINANTLY PMN NO ORGANISMS SEEN    Culture   Final    FEW Normal respiratory flora-no Staph aureus or Pseudomonas seen Performed at [facility], [address] [place], [place] [address]    Report Status 08/02/2023 FINAL  Final         Radiology Studies: IR Fluoro Guide CV Line Right Result Date: 08/07/2023 INDICATION: 58 year old male with history of renal failure requiring central venous access for hemodialysis. EXAM: TUNNELED CENTRAL VENOUS HEMODIALYSIS CATHETER PLACEMENT WITH ULTRASOUND AND FLUOROSCOPIC GUIDANCE MEDICATIONS: Ancef  2 gm IV . The antibiotic was given in an appropriate time interval prior to skin puncture. ANESTHESIA/SEDATION: Moderate (conscious) sedation was employed during this procedure. A total of Versed  1 mg and Fentanyl  25 mcg was administered intravenously. Moderate Sedation Time: 12 minutes. The patient's level of consciousness and vital signs were monitored continuously by radiology nursing throughout the procedure under my direct  supervision. FLUOROSCOPY TIME:  Three mGy reference air kerma COMPLICATIONS: None immediate. PROCEDURE: Informed written consent was obtained from the patient after a discussion of the risks, benefits, and alternatives to treatment. Questions regarding the procedure were encouraged and answered. The right neck and chest were prepped with chlorhexidine  in a sterile fashion, and a sterile drape was applied covering the operative field. Maximum barrier sterile technique with sterile gowns and gloves were used for the procedure. A timeout was performed prior to the initiation of the procedure. After creating a small venotomy incision, a 21 gauge micropuncture kit was utilized to access the internal jugular vein. Real-time ultrasound guidance was utilized for vascular access including the acquisition of a permanent ultrasound image documenting patency of the accessed vessel. A Rosen wire was advanced to the level of the IVC and the micropuncture sheath was exchanged for an 8 Fr dilator. A 14.5 French tunneled hemodialysis catheter measuring 23 cm from tip to cuff was tunneled in a retrograde fashion from the anterior chest wall to the venotomy incision. Serial dilation was then performed an a peel-away sheath was placed. The catheter was then placed through the peel-away sheath with the catheter tip ultimately positioned within the right atrium. Final catheter positioning was confirmed and documented with a spot radiographic image. The catheter aspirates and flushes normally. The catheter was flushed with appropriate volume heparin  dwells. The catheter exit site was secured with a 0-Silk retention suture. The venotomy incision was closed with Dermabond. Sterile dressings were applied. The patient tolerated the procedure well without immediate post procedural complication. IMPRESSION: Successful placement of 23 cm tip to cuff tunneled hemodialysis catheter via the right internal jugular vein with catheter tip terminating  within the right atrium. The catheter is ready for immediate use. Creasie Doctor, MD [facility] Radiology Electronically Signed   By: Creasie Doctor M.D.   On: 08/07/2023 16:25   IR US  Guide Vasc Access Right Result Date: 08/07/2023 INDICATION: 58 year old male with history of renal failure requiring central venous access for hemodialysis. EXAM: TUNNELED CENTRAL VENOUS HEMODIALYSIS CATHETER PLACEMENT WITH ULTRASOUND AND FLUOROSCOPIC GUIDANCE MEDICATIONS: Ancef  2 gm IV . The antibiotic was given in an appropriate time interval prior to skin puncture. ANESTHESIA/SEDATION: Moderate (conscious) sedation was employed during this procedure. A total of Versed  1 mg and Fentanyl   25 mcg was administered intravenously. Moderate Sedation Time: 12 minutes. The patient's level of consciousness and vital signs were monitored continuously by radiology nursing throughout the procedure under my direct supervision. FLUOROSCOPY TIME:  Three mGy reference air kerma COMPLICATIONS: None immediate. PROCEDURE: Informed written consent was obtained from the patient after a discussion of the risks, benefits, and alternatives to treatment. Questions regarding the procedure were encouraged and answered. The right neck and chest were prepped with chlorhexidine  in a sterile fashion, and a sterile drape was applied covering the operative field. Maximum barrier sterile technique with sterile gowns and gloves were used for the procedure. A timeout was performed prior to the initiation of the procedure. After creating a small venotomy incision, a 21 gauge micropuncture kit was utilized to access the internal jugular vein. Real-time ultrasound guidance was utilized for vascular access including the acquisition of a permanent ultrasound image documenting patency of the accessed vessel. A Rosen wire was advanced to the level of the IVC and the micropuncture sheath was exchanged for an 8 Fr dilator. A  14.5 French tunneled hemodialysis catheter measuring 23 cm from tip to cuff was tunneled in a retrograde fashion from the anterior chest wall to the venotomy incision. Serial dilation was then performed an a peel-away sheath was placed. The catheter was then placed through the peel-away sheath with the catheter tip ultimately positioned within the right atrium. Final catheter positioning was confirmed and documented with a spot radiographic image. The catheter aspirates and flushes normally. The catheter was flushed with appropriate volume heparin  dwells. The catheter exit site was secured with a 0-Silk retention suture. The venotomy incision was closed with Dermabond. Sterile dressings were applied. The patient tolerated the procedure well without immediate post procedural complication. IMPRESSION: Successful placement of 23 cm tip to cuff tunneled hemodialysis catheter via the right internal jugular vein with catheter tip terminating within the right atrium. The catheter is ready for immediate use. Creasie Doctor, MD [facility] Radiology Electronically Signed   By: Creasie Doctor M.D.   On: 08/07/2023 16:25        Scheduled Meds:  acyclovir  200 mg Per Tube BID   arformoterol   15 mcg Nebulization BID   busPIRone   15 mg Per Tube QHS   collagenase   Topical Daily   feeding supplement (PROSource TF20)  60 mL Per Tube BID   folic acid   1 mg Per Tube Daily   heparin  injection (subcutaneous)  5,000 Units Subcutaneous Q8H   insulin  aspart  0-20 Units Subcutaneous Q4H   insulin  aspart  4 Units Subcutaneous Q4H   insulin  glargine-yfgn  8 Units Subcutaneous BID   multivitamin  1 tablet Per Tube QHS   nicotine   7 mg Transdermal Daily   nutrition supplement (JUVEN)  1 packet Per Tube BID BM   mouth rinse  15 mL Mouth Rinse Q2H   PARoxetine   40 mg Per Tube Daily   pneumococcal 20-valent conjugate vaccine  0.5 mL Intramuscular Tomorrow-1000   revefenacin    175 mcg Nebulization Daily   sodium chloride  flush  10-40 mL Intracatheter Q12H   sodium chloride  flush  10-40 mL Intracatheter Q12H   thiamine   100 mg Per Tube Daily   voriconazole   350 mg Per Tube Q12H   Continuous Infusions:  feeding supplement (VITAL 1.5 CAL) 1,000 mL (08/08/23 2356)     LOS: 37 days    Time spent: 55 minutes    Vada Garibaldi, MD [facility]

## 2023-08-09 NOTE — Plan of Care (Signed)
  Problem: Education: Goal: Knowledge of General Education information will improve Description: Including pain rating scale, medication(s)/side effects and non-pharmacologic comfort measures Outcome: Progressing   Problem: Health Behavior/Discharge Planning: Goal: Ability to manage health-related needs will improve Outcome: Progressing   Problem: Clinical Measurements: Goal: Ability to maintain clinical measurements within normal limits will improve Outcome: Progressing Goal: Will remain free from infection Outcome: Progressing Goal: Diagnostic test results will improve Outcome: Progressing Goal: Respiratory complications will improve Outcome: Progressing Goal: Cardiovascular complication will be avoided Outcome: Progressing   Problem: Activity: Goal: Risk for activity intolerance will decrease Outcome: Progressing   Problem: Nutrition: Goal: Adequate nutrition will be maintained Outcome: Progressing   Problem: Coping: Goal: Level of anxiety will decrease Outcome: Progressing   Problem: Elimination: Goal: Will not experience complications related to bowel motility Outcome: Progressing Goal: Will not experience complications related to urinary retention Outcome: Progressing   Problem: Pain Managment: Goal: General experience of comfort will improve and/or be controlled Outcome: Progressing   Problem: Safety: Goal: Ability to remain free from injury will improve Outcome: Progressing   Problem: Skin Integrity: Goal: Risk for impaired skin integrity will decrease Outcome: Progressing   Problem: Education: Goal: Ability to describe self-care measures that may prevent or decrease complications (Diabetes Survival Skills Education) will improve Outcome: Progressing Goal: Individualized Educational Video(s) Outcome: Progressing   Problem: Coping: Goal: Ability to adjust to condition or change in health will improve Outcome: Progressing   Problem: Fluid  Volume: Goal: Ability to maintain a balanced intake and output will improve Outcome: Progressing   Problem: Health Behavior/Discharge Planning: Goal: Ability to identify and utilize available resources and services will improve Outcome: Progressing Goal: Ability to manage health-related needs will improve Outcome: Progressing   Problem: Metabolic: Goal: Ability to maintain appropriate glucose levels will improve Outcome: Progressing   Problem: Nutritional: Goal: Maintenance of adequate nutrition will improve Outcome: Progressing Goal: Progress toward achieving an optimal weight will improve Outcome: Progressing   Problem: Skin Integrity: Goal: Risk for impaired skin integrity will decrease Outcome: Progressing   Problem: Tissue Perfusion: Goal: Adequacy of tissue perfusion will improve Outcome: Progressing   Problem: Education: Goal: Knowledge about tracheostomy care/management will improve Outcome: Progressing   Problem: Activity: Goal: Ability to tolerate increased activity will improve Outcome: Progressing   Problem: Health Behavior/Discharge Planning: Goal: Ability to manage tracheostomy will improve Outcome: Progressing   Problem: Respiratory: Goal: Patent airway maintenance will improve Outcome: Progressing   Problem: Role Relationship: Goal: Ability to communicate will improve Outcome: Progressing   Problem: Education: Goal: Knowledge of disease and its progression will improve Outcome: Progressing Goal: Individualized Educational Video(s) Outcome: Progressing   Problem: Fluid Volume: Goal: Compliance with measures to maintain balanced fluid volume will improve Outcome: Progressing   Problem: Health Behavior/Discharge Planning: Goal: Ability to manage health-related needs will improve Outcome: Progressing

## 2023-08-09 NOTE — Plan of Care (Signed)
  Problem: Clinical Measurements: Goal: Ability to maintain clinical measurements within normal limits will improve Outcome: Progressing Goal: Respiratory complications will improve Outcome: Progressing   Problem: Coping: Goal: Level of anxiety will decrease Outcome: Progressing   Problem: Elimination: Goal: Will not experience complications related to bowel motility Outcome: Progressing   Problem: Skin Integrity: Goal: Risk for impaired skin integrity will decrease Outcome: Progressing   Problem: Metabolic: Goal: Ability to maintain appropriate glucose levels will improve Outcome: Progressing   Problem: Skin Integrity: Goal: Risk for impaired skin integrity will decrease Outcome: Progressing   Problem: Activity: Goal: Ability to tolerate increased activity will improve Outcome: Progressing   Problem: Respiratory: Goal: Patent airway maintenance will improve Outcome: Progressing   Problem: Fluid Volume: Goal: Compliance with measures to maintain balanced fluid volume will improve Outcome: Progressing   Problem: Clinical Measurements: Goal: Complications related to the disease process, condition or treatment will be avoided or minimized Outcome: Progressing

## 2023-08-09 NOTE — Progress Notes (Signed)
Routine EEG complete. Results pending.

## 2023-08-09 NOTE — Progress Notes (Signed)
 Pt arrived to the unit from dialysis. VS taken and stable. Bath given, full linen changed. Wound care done. Will continue to monitor pt.

## 2023-08-09 NOTE — Progress Notes (Signed)
 RT went to do a trach assessment and pt is off the floor at this time.

## 2023-08-10 DIAGNOSIS — R569 Unspecified convulsions: Secondary | ICD-10-CM | POA: Diagnosis not present

## 2023-08-10 DIAGNOSIS — R531 Weakness: Secondary | ICD-10-CM | POA: Diagnosis not present

## 2023-08-10 DIAGNOSIS — R4182 Altered mental status, unspecified: Secondary | ICD-10-CM | POA: Diagnosis not present

## 2023-08-10 LAB — PHOSPHORUS

## 2023-08-10 LAB — CBC WITH DIFFERENTIAL/PLATELET
Abs Immature Granulocytes: 0 10*3/uL (ref 0.00–0.07)
Basophils Absolute: 0 10*3/uL (ref 0.0–0.1)
Basophils Relative: 0 %
Eosinophils Absolute: 0 10*3/uL (ref 0.0–0.5)
Eosinophils Relative: 0 %
HCT: 33.2 % — ABNORMAL LOW (ref 39.0–52.0)
Hemoglobin: 10.4 g/dL — ABNORMAL LOW (ref 13.0–17.0)
Lymphocytes Relative: 11 %
Lymphs Abs: 1 10*3/uL (ref 0.7–4.0)
MCH: 31.3 pg (ref 26.0–34.0)
MCHC: 31.3 g/dL (ref 30.0–36.0)
MCV: 100 fL (ref 80.0–100.0)
Monocytes Absolute: 0.6 10*3/uL (ref 0.1–1.0)
Monocytes Relative: 7 %
Neutro Abs: 7.5 10*3/uL (ref 1.7–7.7)
Neutrophils Relative %: 82 %
Platelets: 299 10*3/uL (ref 150–400)
RBC: 3.32 MIL/uL — ABNORMAL LOW (ref 4.22–5.81)
RDW: 22.4 % — ABNORMAL HIGH (ref 11.5–15.5)
WBC: 9.1 10*3/uL (ref 4.0–10.5)
nRBC: 0.5 % — ABNORMAL HIGH (ref 0.0–0.2)
nRBC: 1 /100{WBCs} — ABNORMAL HIGH

## 2023-08-10 LAB — MAGNESIUM: Magnesium: 2.3 mg/dL (ref 1.7–2.4)

## 2023-08-10 LAB — GLUCOSE, CAPILLARY
Glucose-Capillary: 144 mg/dL — ABNORMAL HIGH (ref 70–99)
Glucose-Capillary: 189 mg/dL — ABNORMAL HIGH (ref 70–99)
Glucose-Capillary: 213 mg/dL — ABNORMAL HIGH (ref 70–99)
Glucose-Capillary: 217 mg/dL — ABNORMAL HIGH (ref 70–99)
Glucose-Capillary: 231 mg/dL — ABNORMAL HIGH (ref 70–99)
Glucose-Capillary: 232 mg/dL — ABNORMAL HIGH (ref 70–99)

## 2023-08-10 LAB — COMPREHENSIVE METABOLIC PANEL WITH GFR
ALT: 12 U/L (ref 0–44)
AST: 16 U/L (ref 15–41)
Albumin: 2.4 g/dL — ABNORMAL LOW (ref 3.5–5.0)
Alkaline Phosphatase: 46 U/L (ref 38–126)
Anion gap: 14 (ref 5–15)
BUN: 121 mg/dL — ABNORMAL HIGH (ref 6–20)
CO2: 24 mmol/L (ref 22–32)
Calcium: 7.9 mg/dL — ABNORMAL LOW (ref 8.9–10.3)
Chloride: 96 mmol/L — ABNORMAL LOW (ref 98–111)
Creatinine, Ser: 3.28 mg/dL — ABNORMAL HIGH (ref 0.61–1.24)
GFR, Estimated: 21 mL/min — ABNORMAL LOW (ref >60)
Glucose, Bld: 201 mg/dL — ABNORMAL HIGH (ref 70–99)
Potassium: 4.4 mmol/L (ref 3.5–5.1)
Sodium: 134 mmol/L — ABNORMAL LOW (ref 135–145)
Total Bilirubin: 1.1 mg/dL (ref 0.0–1.2)
Total Protein: 8.7 g/dL — ABNORMAL HIGH (ref 6.5–8.1)

## 2023-08-10 LAB — LACTATE DEHYDROGENASE: LDH: 371 U/L — ABNORMAL HIGH (ref 98–192)

## 2023-08-10 MED ORDER — CLONAZEPAM 0.5 MG PO TABS
0.5000 mg | ORAL_TABLET | Freq: Three times a day (TID) | ORAL | Status: DC | PRN
  Administered 2023-08-10 – 2023-08-15 (×2): 0.5 mg via ORAL
  Filled 2023-08-10 (×2): qty 1

## 2023-08-10 MED ORDER — VALPROATE SODIUM 100 MG/ML IV SOLN
500.0000 mg | Freq: Two times a day (BID) | INTRAVENOUS | Status: DC
  Administered 2023-08-10 – 2023-08-14 (×8): 500 mg via INTRAVENOUS
  Filled 2023-08-10 (×6): qty 5
  Filled 2023-08-10: qty 500
  Filled 2023-08-10 (×3): qty 5

## 2023-08-10 NOTE — Procedures (Signed)
 Patient Name: Frank [name].  MRN: [id number]  Epilepsy Attending: Arleene Lack  Referring Physician/Provider: Vada Garibaldi, MD  Date: 08/09/2023 Duration: 24.42 mins  Patient history: 58yo M with ams. EEG to evaluate for seizure  Level of alertness: Awake/ lethargic   AEDs during EEG study: None  Technical aspects: This EEG study was done with scalp electrodes positioned according to the 10-20 International system of electrode placement. Electrical activity was reviewed with band pass filter of 1-70Hz , sensitivity of 7 uV/mm, display speed of 75mm/sec with a 60Hz  notched filter applied as appropriate. EEG data were recorded continuously and digitally stored.  Video monitoring was available and reviewed as appropriate.  Description: EEG showed continuous generalized 5 to 6 Hz theta slowing.  Patient was noted to have episodes of brief sudden head jerking every few seconds. Concomitant EEG before, during and after the event did not show any EEG changes suggest seizure.    Hyperventilation and photic stimulation were not performed.     EEG was technically difficult due to significant eye flutter and movement artifact.  ABNORMALITY - Continuous slow, generalized  IMPRESSION: This technically difficult study is suggestive of moderate diffuse encephalopathy. No seizures or epileptiform discharges were seen throughout the recording.  Patient was noted to have episodes of brief sudden head jerking every few seconds without concomitant EEG change. These events were most likely non epileptic.  Loistine Eberlin O Tangy Drozdowski

## 2023-08-10 NOTE — Progress Notes (Signed)
 PROGRESS NOTE    Frank [name].  ZHY:[id number] DOB: 07/30/1965 DOA: 07/03/2023 PCP: Serita Danes, MD    Brief Narrative:  Patient with history of obstructive sleep apnea on CPAP, pathological fracture and multiple ORIF left humerus, hypertension,  hyperlipidemia and asthma who had been feeling weak for about 1 week, admitted to [facility] with concern for sepsis, community-acquired pneumonia and acute encephalopathy.  Admitted and treated with antibiotics.  Workup revealed lytic bony lesions with concern for multiple myeloma.  Transfusions done.  Patient developed acute renal failure and is currently on hemodialysis.  Admitted to ICU with following significant events. Patient underwent tracheostomy placement and now on trach collar. Patient is still on NG tube feeding. Patient is on dialysis now.  He is still encephalopathic.  4/17-admitted to ICU intubated, encephalopathic. 4/20 atrial fibrillation with RVR 4/21 nosebleed after IR NG attempt, NG placed by PCCM in afternoon, stable transferred to [facility] 4/22 intubated; some abla requiring prbcs; ct abd/pelvis showing acute groin hematoma tracking from posterior presumed from site of bone marrow biopsy; heparin  held EEG 4/25 >> moderate to severe encephalopathy without any seizures or epileptiform discharges 4/23 respiratory culture >> MRSE 4/28 remains intubated on CRRT, down to one pressor  4/29 labile BP, NE back up. Responded a bit to fluids. Received Velcade  and Cytoxan  chemotherapy for newly diagnosed multiple myeloma. 5/1 remains encephalopathic 5/2 LP and ETT exchange  5/5 1 PRBC. Palliative care met w family  5/6 IVIG. plan for trach established. Added midodrine  after failed vaso wean. Abx/fungal changed to levaquin   5/7 weaning pressors incr midodrine  incr lactulose . Vaso off  5/8 Trach placed. Some post op bleeding improved with thrombi-pad 5/10 voriconazole  added back based on CT Chest findings, MRI brain with no  acute changes 5/13 more alert and able to follow some commands. 5/19 and had been doing well on trach collar, and was on the verge of transferring to progressive when had a trach dislodgment. 5/22 more awake. 5/23, transferred to floor.  Micro: 4/28 BCx > no growth, final 4/30 respiratory culture-few PMN, rare budding yeast, abundant GPC> few aspergillus fumigatus Quantiferon 5/2 > neg Trach asp 5/4> rare GPC, rare mold  PCP smear trach asp 5/5 > neg  Trach asp 5/9 > abundant staph hemolyticus, few budding yeast Trach aspirate 5/15 - ngtd Blood cultures 5/11-no growth   CSF studies 1 RBC, 1 WBC, glucose 106 CSF culture-2 organisms acute, rare WBC> NGTD Cryptococcal antigen negative meningitis and encephalitis panel negative VDRL  non-reactive    XR left arm consistent with ORIF of pathologic fracture.  Extensive bone loss.  Subjective:  Patient seen and examined.  Continuously febrile despite every 4 hours Tylenol .  Currently on cooling blankets and ice packs. If tries to track but mostly tremulous and unresponsive.  On 6 L of oxygen through trach collar. Remains in very poor mental status.  I am not sure patient is chemotherapy or dialysis candidate given his source of poor clinical status and mental status.  Hopefully will meet with family today.   Assessment & Plan:   Acute hypoxic respiratory failure and respiratory insufficiency status post tracheostomy:  Patient currently remains on trach collar.  overall poor health.  Continue aggressive chest physiotherapy.  Pulmonary following.  Severe acute metabolic encephalopathy with hypoactive delirium, seizure disorder: Mental status waxing and waning.  MRI of the brain normal.  LP without evidence of meningitis or encephalitis. Patient underwent EEG.  Without evidence of seizure.  Patient was on Depakote   at home that was discontinued. 5/24, EEG shows severe metabolic depression, no seizures. Will put patient back on Depakote   that he was using for seizure and bipolar disorder.  Starting with 500 mg twice daily.  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  ID following.  Planning for prolonged therapies.  Also added acyclovir.  Multiple myeloma: Started on Velcade  and Cytoxan .  Followed by oncology.  He may be treatable for multiple myeloma, however his general condition is grave.  Myeloma kidney, acute kidney injury on chronic kidney disease stage IIIa: Baseline creatinine 1.1-1.5.  Creatinine 5.8 here.  AKI due to myeloma kidney and hypercalcemia, ACE inhibitors. CRRT/23.  Now on dialysis.  Tunneled catheter placed 5/22.   Paroxysmal A-fib, now in sinus rhythm.  Rate controlled.  Not on any rate control medications.  Unable to anticoagulate due to hematoma and intolerance.  Type 2 diabetes: On insulin  coverage.  Obstructive sleep apnea: Now with trach collar.  Goal of care: Previously seen by palliative care.  Remains in very poor clinical status.  Currently desires to continue all the treatments. Patient has core track tube which is there now for more than a month. He will likely need PEG tube however remains in poor clinical status to go for procedure. His wife and daughter may visit today, will discuss clinical status.  Given his overall picture he may benefit with palliation and hospice if agreed by family members.      DVT prophylaxis: heparin  injection 5,000 Units Start: 07/25/23 0600 Place and maintain sequential compression device Start: 07/14/23 1033   Code Status: Full code Family Communication: Wife called and updated 5/23.  Hoping to meet today at the bedside. Disposition Plan: Status is: Inpatient Remains inpatient appropriate because: Critically ill     Consultants:  Critical care ID Nephrology Oncology Palliative care  Procedures:  Multiple procedures as above  Antimicrobials:  Multiple antibiotics.  Currently on voriconazole  and acyclovir.     Objective: Vitals:    08/10/23 0346 08/10/23 0435 08/10/23 0815 08/10/23 1030  BP: (!) 136/95  (!) 136/101   Pulse: (!) 117  (!) 115   Resp: (!) 25  (!) 24   Temp: (!) 100.8 F (38.2 C)  (!) 102.7 F (39.3 C) (!) 102.9 F (39.4 C)  TempSrc: Axillary  Axillary Axillary  SpO2: 96%  97%   Weight:  96.9 kg    Height:        Intake/Output Summary (Last 24 hours) at 08/10/2023 1046 Last data filed at 08/10/2023 1002 Gross per 24 hour  Intake 82.5 ml  Output 3670 ml  Net -3587.5 ml   Filed Weights   08/09/23 1544 08/09/23 1911 08/10/23 0435  Weight: 102.4 kg 100.4 kg 96.9 kg    Examination:  General exam: Sick looking.  Tremulous.  Mostly without response. Respiratory system: Coarse breath sounds.  Conducted airway sounds.  On trach collar.  Poor inspiratory effort. Cardiovascular system: S1 & S2 heard, RRR.  Gastrointestinal system: Abdomen is nondistended, soft and nontender. No organomegaly or masses felt. Normal bowel sounds heard. Central nervous system: Alert on stimulation.  Barely follows simple commands including mobilizing extremities.  Does not have any voice because of trach collar. Patient pulls right upper extremity and left lower extremity. Deformity and swelling left arm consistent with previous ORIF.  Restricted mobility. Pressure Injury 07/05/23 Heel Left;Right Unstageable - Full thickness tissue loss in which the base of the injury is covered by slough (yellow, tan, gray, green or brown) and/or eschar (tan, brown  or black) in the wound bed. (Active)  07/05/23 1108  Location: Heel  Location Orientation: Left;Right  Staging: Unstageable - Full thickness tissue loss in which the base of the injury is covered by slough (yellow, tan, gray, green or brown) and/or eschar (tan, brown or black) in the wound bed.  Wound Description (Comments):   Present on Admission: Yes     Pressure Injury 07/11/23 Buttocks Mid Unstageable - Full thickness tissue loss in which the base of the injury is covered  by slough (yellow, tan, gray, green or brown) and/or eschar (tan, brown or black) in the wound bed. 8 cm x 6 cmx 0.1 cm (Active)  07/11/23 1600  Location: Buttocks  Location Orientation: Mid  Staging: Unstageable - Full thickness tissue loss in which the base of the injury is covered by slough (yellow, tan, gray, green or brown) and/or eschar (tan, brown or black) in the wound bed.  Wound Description (Comments): 8 cm x 6 cmx 0.1 cm  Present on Admission: No     Pressure Injury 07/27/23 Buttocks Right;Lower (Active)  07/27/23 1700  Location: Buttocks  Location Orientation: Right;Lower  Staging:   Wound Description (Comments):   Present on Admission: Yes  Fecal management system with loose stool.      Data Reviewed: I have personally reviewed following labs and imaging studies  CBC: Recent Labs  Lab 08/06/23 0205 08/09/23 0719 08/10/23 0711  WBC 10.8* 8.7 9.1  NEUTROABS 7.0 5.7 7.5  HGB 9.4* 10.1* 10.4*  HCT 30.4* 33.4* 33.2*  MCV 100.3* 104.4* 100.0  PLT 306 284 299   Basic Metabolic Panel: Recent Labs  Lab 08/05/23 1154 08/06/23 0205 08/06/23 0206 08/07/23 0256 08/08/23 0450 08/09/23 0719 08/10/23 0711  NA 134*   < > 139 136 132*  131* 137 134*  K 3.9   < > 4.3 4.6 3.4*  3.4* 4.7 4.4  CL 98   < > 99 101 96*  96* 100 96*  CO2 24   < > 23 22 25  25  21* 24  GLUCOSE 196*   < > 166* 199* 158*  158* 211* 201*  BUN 111*   < > 137* 172* 101*  103* 165* 121*  CREATININE 4.08*   < > 4.55* 4.90* 2.83*  2.86* 4.10* 3.28*  CALCIUM  8.0*   < > 8.2* 7.9* 7.7*  7.6* 7.6* 7.9*  MG  --   --   --  2.7* 2.5* 2.6* 2.3  PHOS RESULTS UNAVAILABLE DUE TO INTERFERING SUBSTANCE  --  3.9 4.5 RESULTS UNAVAILABLE DUE TO INTERFERING SUBSTANCE 4.0  --    < > = values in this interval not displayed.   GFR: Estimated Creatinine Clearance: 29.3 mL/min (A) (by C-G formula based on SCr of 3.28 mg/dL (H)). Liver Function Tests: Recent Labs  Lab 08/06/23 0205 08/06/23 0206  08/07/23 0256 08/08/23 0450 08/09/23 0719 08/10/23 0711  AST 13*  --   --  15 15 16   ALT 12  --   --  10 7 12   ALKPHOS 62  --   --  58 47 46  BILITOT 0.7  --   --  0.5 1.0 1.1  PROT 8.0  --   --  8.8* 8.1 8.7*  ALBUMIN  2.0* 2.0* 1.9* 2.2*  2.2* 2.2* 2.4*   No results for input(s): "LIPASE", "AMYLASE" in the last 168 hours. No results for input(s): "AMMONIA" in the last 168 hours.  Coagulation Profile: No results for input(s): "INR", "PROTIME" in the last  168 hours. Cardiac Enzymes: No results for input(s): "CKTOTAL", "CKMB", "CKMBINDEX", "TROPONINI" in the last 168 hours. BNP (last 3 results) No results for input(s): "PROBNP" in the last 8760 hours. HbA1C: No results for input(s): "HGBA1C" in the last 72 hours. CBG: Recent Labs  Lab 08/09/23 1142 08/09/23 2135 08/09/23 2348 08/10/23 0348 08/10/23 0819  GLUCAP 210* 229* 235* 189* 217*   Lipid Profile: No results for input(s): "CHOL", "HDL", "LDLCALC", "TRIG", "CHOLHDL", "LDLDIRECT" in the last 72 hours. Thyroid Function Tests: No results for input(s): "TSH", "T4TOTAL", "FREET4", "T3FREE", "THYROIDAB" in the last 72 hours. Anemia Panel: No results for input(s): "VITAMINB12", "FOLATE", "FERRITIN", "TIBC", "IRON", "RETICCTPCT" in the last 72 hours. Sepsis Labs: No results for input(s): "PROCALCITON", "LATICACIDVEN" in the last 168 hours.  No results found for this or any previous visit (from the past 240 hours).        Radiology Studies: EEG adult Result Date: 08/10/2023 Arleene Lack, MD     08/10/2023  6:07 AM Patient Name: Kojo Liby. MRN: [id number] Epilepsy Attending: Arleene Lack Referring Physician/Provider: Vada Garibaldi, MD Date: 08/09/2023 Duration: 24.42 mins Patient history: 58yo M with ams. EEG to evaluate for seizure Level of alertness: Awake/ lethargic AEDs during EEG study: None Technical aspects: This EEG study was done with scalp electrodes positioned according to the 10-20 International  system of electrode placement. Electrical activity was reviewed with band pass filter of 1-70Hz , sensitivity of 7 uV/mm, display speed of 20mm/sec with a 60Hz  notched filter applied as appropriate. EEG data were recorded continuously and digitally stored.  Video monitoring was available and reviewed as appropriate. Description: EEG showed continuous generalized 5 to 6 Hz theta slowing. Patient was noted to have episodes of brief sudden head jerking every few seconds. Concomitant EEG before, during and after the event did not show any EEG changes suggest seizure.  Hyperventilation and photic stimulation were not performed.   EEG was technically difficult due to significant eye flutter and movement artifact. ABNORMALITY - Continuous slow, generalized IMPRESSION: This technically difficult study is suggestive of moderate diffuse encephalopathy. No seizures or epileptiform discharges were seen throughout the recording. Patient was noted to have episodes of brief sudden head jerking every few seconds without concomitant EEG change. These events were most likely non epileptic. Priyanka O Yadav        Scheduled Meds:  acyclovir  200 mg Per Tube BID   arformoterol   15 mcg Nebulization BID   busPIRone   15 mg Per Tube QHS   collagenase   Topical Daily   feeding supplement (PROSource TF20)  60 mL Per Tube BID   folic acid   1 mg Per Tube Daily   heparin  injection (subcutaneous)  5,000 Units Subcutaneous Q8H   insulin  aspart  0-20 Units Subcutaneous Q4H   insulin  aspart  4 Units Subcutaneous Q4H   insulin  glargine-yfgn  8 Units Subcutaneous BID   multivitamin  1 tablet Per Tube QHS   nicotine   7 mg Transdermal Daily   nutrition supplement (JUVEN)  1 packet Per Tube BID BM   mouth rinse  15 mL Mouth Rinse Q2H   PARoxetine   40 mg Per Tube Daily   pneumococcal 20-valent conjugate vaccine  0.5 mL Intramuscular Tomorrow-1000   revefenacin   175 mcg Nebulization Daily   sodium chloride  flush  10-40 mL  Intracatheter Q12H   sodium chloride  flush  10-40 mL Intracatheter Q12H   thiamine   100 mg Per Tube Daily   voriconazole   350 mg Per  Tube Q12H   Continuous Infusions:  feeding supplement (VITAL 1.5 CAL) 65 mL/hr at 08/10/23 1002   valproate sodium        LOS: 38 days    Time spent: 51 minutes    Vada Garibaldi, MD [facility]

## 2023-08-10 NOTE — Plan of Care (Signed)
  Problem: Education: Goal: Knowledge of General Education information will improve Description: Including pain rating scale, medication(s)/side effects and non-pharmacologic comfort measures Outcome: Progressing   Problem: Health Behavior/Discharge Planning: Goal: Ability to manage health-related needs will improve Outcome: Progressing   Problem: Clinical Measurements: Goal: Ability to maintain clinical measurements within normal limits will improve Outcome: Progressing Goal: Will remain free from infection Outcome: Progressing Goal: Diagnostic test results will improve Outcome: Progressing Goal: Respiratory complications will improve Outcome: Progressing Goal: Cardiovascular complication will be avoided Outcome: Progressing   Problem: Activity: Goal: Risk for activity intolerance will decrease Outcome: Progressing   Problem: Nutrition: Goal: Adequate nutrition will be maintained Outcome: Progressing   Problem: Coping: Goal: Level of anxiety will decrease Outcome: Progressing   Problem: Elimination: Goal: Will not experience complications related to bowel motility Outcome: Progressing Goal: Will not experience complications related to urinary retention Outcome: Progressing   Problem: Safety: Goal: Ability to remain free from injury will improve Outcome: Progressing   Problem: Skin Integrity: Goal: Risk for impaired skin integrity will decrease Outcome: Progressing   Problem: Education: Goal: Ability to describe self-care measures that may prevent or decrease complications (Diabetes Survival Skills Education) will improve Outcome: Progressing Goal: Individualized Educational Video(s) Outcome: Progressing   Problem: Coping: Goal: Ability to adjust to condition or change in health will improve Outcome: Progressing   Problem: Fluid Volume: Goal: Ability to maintain a balanced intake and output will improve Outcome: Progressing   Problem: Metabolic: Goal:  Ability to maintain appropriate glucose levels will improve Outcome: Progressing   Problem: Nutritional: Goal: Maintenance of adequate nutrition will improve Outcome: Progressing Goal: Progress toward achieving an optimal weight will improve Outcome: Progressing   Problem: Tissue Perfusion: Goal: Adequacy of tissue perfusion will improve Outcome: Progressing   Problem: Education: Goal: Knowledge about tracheostomy care/management will improve Outcome: Progressing   Problem: Respiratory: Goal: Patent airway maintenance will improve Outcome: Progressing   Problem: Role Relationship: Goal: Ability to communicate will improve Outcome: Progressing   Problem: Fluid Volume: Goal: Compliance with measures to maintain balanced fluid volume will improve Outcome: Progressing   Problem: Health Behavior/Discharge Planning: Goal: Ability to manage health-related needs will improve Outcome: Progressing

## 2023-08-10 NOTE — Progress Notes (Signed)
 [facility] NEPHROLOGY PROGRESS NOTE   Subjective  No significant change in clinical status which is eyes open but no commands Got HD yest - removed 2 L-  tolerated well hemodynamically  1100 UOP recorded    Objective Vital signs in last 24 hours: Vitals:   07/26/23 1100 07/26/23 1125 07/26/23 1130 07/26/23 1200  BP: (!) 179/96 (!) 146/85 115/72 (!) 83/50  Pulse: (!) 105 96 93 76  Resp: 16 20 20 20   Temp:    99.8 F (37.7 C)  TempSrc:    Axillary  SpO2: 100% 98% 98% 99%  Weight:      Height:        Physical Exam: General: Critically ill looking male, on trach collar-  not responsive to me this AM-  looks but no commands Heart:RRR, s1s2 nl Lungs: clear ant/ lat Abdomen:soft, non-tender, non-distended Extremities: no pitting edema, LUE distortion is broken bone not edema Neurology: responding minimally TDC in place    Assessment/ Plan: Pt is a 58 y.o. yo male with  HTN, HLD, type II DM, BPH, seizure who  presented at [facility] due to AMS, admitted for sepsis due to pneumonia, encephalopathy, sepsis, AKI, hypercalcemia, symptomatic anemia and lytic lesion concern for multiple myeloma.  Problems # Oliguric AKI on CKD 3a - b/l creat 1.1- 1.5 from 2024. Creat here 5.8 on presentation. AKI due to myeloma kidney +/- hyperCa +/- ACEi. CRRT from 4/23--- 5/10.  on iHD now.   dialysis 5/22 following placement  tunneled HD catheter, also on 5/24-  - attempt to  keep on TTS schedule  - is making some urine but not clearing yet-   is catabolic so might need treatment on Monday as well-  will order.  In his current state am not sure he is a good candidate for OP or really any  HD-  hoping for eventual recovery  # AMS/acute metabolic encephalopathy: MRI, LP completed and unrevealing.  Waxes and wanes but has not been good the last 3 days I have been seeing him-  not sure a candidate for HD in his current state   #Volume -   Gentle UF moving forward  #  Hypotension: normotensive,   # Multiple myeloma: started on Velcade  and Cytoxan ,  Per onc  #AHRF/ aspergillus PNA/ s/p trach 5/8: on voriconazole  IV, off all other IV antibiotics. Off the ventilator on trach collar, per CCM  # Atrial fib-  on amio  #Anemia - prn transfusions. On epogen  per onc.   # Persistent fevers with negative infectious workup thought to be related to malignancy  Reche Canales, MD  07/26/2023, 1:31 PM    Inpatient medications:  amiodarone   200 mg Per NG tube Daily   arformoterol   15 mcg Nebulization BID   busPIRone   15 mg Per Tube QHS   clonazePAM   1 mg Per Tube BID   docusate  100 mg Per Tube BID   epoetin  alfa  40,000 Units Subcutaneous QODAY   famotidine   10.4 mg Per Tube Daily   feeding supplement (PROSource TF20)  60 mL Per Tube TID   folic acid   1 mg Per Tube Daily   gabapentin   100 mg Per Tube Q12H   heparin  injection (subcutaneous)  5,000 Units Subcutaneous Q8H   insulin  aspart  0-20 Units Subcutaneous Q4H   insulin  aspart  8 Units Subcutaneous Q4H   insulin  glargine-yfgn  10 Units Subcutaneous BID   lactulose   20 g Per Tube TID  leptospermum manuka honey  1 Application Topical Daily   lidocaine -EPINEPHrine   20 mL Intradermal Once   midodrine   10 mg Per Tube TID WC   nicotine   7 mg Transdermal Daily   mouth rinse  15 mL Mouth Rinse Q2H   PARoxetine   40 mg Per Tube Daily   pneumococcal 20-valent conjugate vaccine  0.5 mL Intramuscular Tomorrow-1000   polyethylene glycol  17 g Per Tube Daily   QUEtiapine   50 mg Per Tube QHS   revefenacin   175 mcg Nebulization Daily   sodium chloride  flush  10-40 mL Intracatheter Q12H   sodium chloride  HYPERTONIC  4 mL Nebulization Daily   thiamine   100 mg Per Tube Daily    ampicillin -sulbactam (UNASYN ) IV Stopped (07/26/23 1308)   dexmedetomidine  (PRECEDEX ) IV infusion 1.2 mcg/kg/hr (07/26/23 1300)   feeding supplement (VITAL 1.5 CAL) 60 mL/hr at 07/26/23 1300   fentaNYL  infusion INTRAVENOUS 200  mcg/hr (07/26/23 1300)   levofloxacin  (LEVAQUIN ) IV 100 mL/hr at 07/26/23 1300   midazolam      norepinephrine  (LEVOPHED ) Adult infusion 2 mcg/min (07/26/23 1300)   sodium PHOSPHATE  IVPB (in mmol)     voriconazole  Stopped (07/26/23 1225)   Followed by   [name] ON 07/27/2023] voriconazole      acetaminophen  (TYLENOL ) oral liquid 160 mg/5 mL **OR** acetaminophen , fentaNYL , ipratropium-albuterol , midazolam , ondansetron  (ZOFRAN ) IV, mouth rinse, oxyCODONE , polyethylene glycol, polyvinyl alcohol , sodium chloride  flush

## 2023-08-10 NOTE — Plan of Care (Signed)
  Problem: Education: Goal: Knowledge about tracheostomy care/management will improve Outcome: Progressing   Problem: Activity: Goal: Ability to tolerate increased activity will improve Outcome: Progressing   Problem: Health Behavior/Discharge Planning: Goal: Ability to manage tracheostomy will improve Outcome: Progressing   Problem: Respiratory: Goal: Patent airway maintenance will improve Outcome: Progressing   Problem: Role Relationship: Goal: Ability to communicate will improve Outcome: Progressing   Problem: Education: Goal: Knowledge of disease and its progression will improve Outcome: Progressing Goal: Individualized Educational Video(s) Outcome: Progressing   Problem: Fluid Volume: Goal: Compliance with measures to maintain balanced fluid volume will improve Outcome: Progressing   Problem: Health Behavior/Discharge Planning: Goal: Ability to manage health-related needs will improve Outcome: Progressing   Problem: Nutritional: Goal: Ability to make healthy dietary choices will improve Outcome: Progressing   Problem: Clinical Measurements: Goal: Complications related to the disease process, condition or treatment will be avoided or minimized Outcome: Progressing

## 2023-08-11 DIAGNOSIS — R531 Weakness: Secondary | ICD-10-CM | POA: Diagnosis not present

## 2023-08-11 LAB — COMPREHENSIVE METABOLIC PANEL WITH GFR
ALT: 10 U/L (ref 0–44)
AST: 14 U/L — ABNORMAL LOW (ref 15–41)
Albumin: 2.3 g/dL — ABNORMAL LOW (ref 3.5–5.0)
Alkaline Phosphatase: 45 U/L (ref 38–126)
Anion gap: 18 — ABNORMAL HIGH (ref 5–15)
BUN: 170 mg/dL — ABNORMAL HIGH (ref 6–20)
CO2: 20 mmol/L — ABNORMAL LOW (ref 22–32)
Calcium: 8.2 mg/dL — ABNORMAL LOW (ref 8.9–10.3)
Chloride: 99 mmol/L (ref 98–111)
Creatinine, Ser: 3.61 mg/dL — ABNORMAL HIGH (ref 0.61–1.24)
GFR, Estimated: 19 mL/min — ABNORMAL LOW (ref >60)
Glucose, Bld: 218 mg/dL — ABNORMAL HIGH (ref 70–99)
Potassium: 4.9 mmol/L (ref 3.5–5.1)
Sodium: 137 mmol/L (ref 135–145)
Total Bilirubin: 0.8 mg/dL (ref 0.0–1.2)
Total Protein: 8.2 g/dL — ABNORMAL HIGH (ref 6.5–8.1)

## 2023-08-11 LAB — CBC WITH DIFFERENTIAL/PLATELET
Abs Immature Granulocytes: 0.04 10*3/uL (ref 0.00–0.07)
Basophils Absolute: 0 10*3/uL (ref 0.0–0.1)
Basophils Relative: 0 %
Eosinophils Absolute: 0 10*3/uL (ref 0.0–0.5)
Eosinophils Relative: 0 %
HCT: 37.9 % — ABNORMAL LOW (ref 39.0–52.0)
Hemoglobin: 11.5 g/dL — ABNORMAL LOW (ref 13.0–17.0)
Immature Granulocytes: 1 %
Lymphocytes Relative: 11 %
Lymphs Abs: 0.7 10*3/uL (ref 0.7–4.0)
MCH: 31.1 pg (ref 26.0–34.0)
MCHC: 30.3 g/dL (ref 30.0–36.0)
MCV: 102.4 fL — ABNORMAL HIGH (ref 80.0–100.0)
Monocytes Absolute: 0.5 10*3/uL (ref 0.1–1.0)
Monocytes Relative: 8 %
Neutro Abs: 5.5 10*3/uL (ref 1.7–7.7)
Neutrophils Relative %: 80 %
Platelets: 287 10*3/uL (ref 150–400)
RBC: 3.7 MIL/uL — ABNORMAL LOW (ref 4.22–5.81)
RDW: 22.1 % — ABNORMAL HIGH (ref 11.5–15.5)
WBC: 6.8 10*3/uL (ref 4.0–10.5)
nRBC: 0.3 % — ABNORMAL HIGH (ref 0.0–0.2)

## 2023-08-11 LAB — GLUCOSE, CAPILLARY
Glucose-Capillary: 166 mg/dL — ABNORMAL HIGH (ref 70–99)
Glucose-Capillary: 190 mg/dL — ABNORMAL HIGH (ref 70–99)
Glucose-Capillary: 199 mg/dL — ABNORMAL HIGH (ref 70–99)
Glucose-Capillary: 203 mg/dL — ABNORMAL HIGH (ref 70–99)
Glucose-Capillary: 237 mg/dL — ABNORMAL HIGH (ref 70–99)
Glucose-Capillary: 279 mg/dL — ABNORMAL HIGH (ref 70–99)

## 2023-08-11 LAB — MISC LABCORP TEST (SEND OUT)
Labcorp test code: 1024
Labcorp test code: 1024

## 2023-08-11 LAB — RENAL FUNCTION PANEL
Albumin: 2.3 g/dL — ABNORMAL LOW (ref 3.5–5.0)
Anion gap: 17 — ABNORMAL HIGH (ref 5–15)
BUN: 166 mg/dL — ABNORMAL HIGH (ref 6–20)
CO2: 21 mmol/L — ABNORMAL LOW (ref 22–32)
Calcium: 8 mg/dL — ABNORMAL LOW (ref 8.9–10.3)
Chloride: 99 mmol/L (ref 98–111)
Creatinine, Ser: 3.6 mg/dL — ABNORMAL HIGH (ref 0.61–1.24)
GFR, Estimated: 19 mL/min — ABNORMAL LOW (ref >60)
Glucose, Bld: 213 mg/dL — ABNORMAL HIGH (ref 70–99)
Phosphorus: 6.8 mg/dL — ABNORMAL HIGH (ref 2.5–4.6)
Potassium: 4.8 mmol/L (ref 3.5–5.1)
Sodium: 137 mmol/L (ref 135–145)

## 2023-08-11 LAB — MAGNESIUM: Magnesium: 2.7 mg/dL — ABNORMAL HIGH (ref 1.7–2.4)

## 2023-08-11 LAB — LACTATE DEHYDROGENASE: LDH: 377 U/L — ABNORMAL HIGH (ref 98–192)

## 2023-08-11 MED ORDER — HEPARIN SODIUM (PORCINE) 1000 UNIT/ML IJ SOLN
INTRAMUSCULAR | Status: AC
  Filled 2023-08-11: qty 4

## 2023-08-11 NOTE — Progress Notes (Signed)
 HD Note:  Some information was entered later than the data was gathered due to patient care needs. The stated time with the data is accurate.  Received patient in bed to unit.   Alert and oriented.   Informed consent signed and in chart.   Access used: Right Chest [facility] Access issues: None  Patient tolerated treatment well.   TX duration: 3 hours per MD orders  Alert, without acute distress.  Total UF removed: 2.5L  Hand-off given to patient's nurse.   Transported back to the room   Barron Lien RN [facility].

## 2023-08-11 NOTE — Plan of Care (Signed)
  Problem: Education: Goal: Knowledge of General Education information will improve Description: Including pain rating scale, medication(s)/side effects and non-pharmacologic comfort measures Outcome: Progressing   Problem: Health Behavior/Discharge Planning: Goal: Ability to manage health-related needs will improve Outcome: Progressing   Problem: Clinical Measurements: Goal: Ability to maintain clinical measurements within normal limits will improve Outcome: Progressing Goal: Will remain free from infection Outcome: Progressing Goal: Diagnostic test results will improve Outcome: Progressing Goal: Respiratory complications will improve Outcome: Progressing Goal: Cardiovascular complication will be avoided Outcome: Progressing   Problem: Activity: Goal: Risk for activity intolerance will decrease Outcome: Progressing   Problem: Nutrition: Goal: Adequate nutrition will be maintained Outcome: Progressing   Problem: Coping: Goal: Level of anxiety will decrease Outcome: Progressing   Problem: Elimination: Goal: Will not experience complications related to bowel motility Outcome: Progressing Goal: Will not experience complications related to urinary retention Outcome: Progressing   Problem: Pain Managment: Goal: General experience of comfort will improve and/or be controlled Outcome: Progressing   Problem: Safety: Goal: Ability to remain free from injury will improve Outcome: Progressing   Problem: Skin Integrity: Goal: Risk for impaired skin integrity will decrease Outcome: Progressing   Problem: Education: Goal: Ability to describe self-care measures that may prevent or decrease complications (Diabetes Survival Skills Education) will improve Outcome: Progressing Goal: Individualized Educational Video(s) Outcome: Progressing   Problem: Coping: Goal: Ability to adjust to condition or change in health will improve Outcome: Progressing   Problem: Fluid  Volume: Goal: Ability to maintain a balanced intake and output will improve Outcome: Progressing   Problem: Health Behavior/Discharge Planning: Goal: Ability to identify and utilize available resources and services will improve Outcome: Progressing Goal: Ability to manage health-related needs will improve Outcome: Progressing   Problem: Metabolic: Goal: Ability to maintain appropriate glucose levels will improve Outcome: Progressing   Problem: Nutritional: Goal: Maintenance of adequate nutrition will improve Outcome: Progressing Goal: Progress toward achieving an optimal weight will improve Outcome: Progressing   Problem: Skin Integrity: Goal: Risk for impaired skin integrity will decrease Outcome: Progressing   Problem: Tissue Perfusion: Goal: Adequacy of tissue perfusion will improve Outcome: Progressing   Problem: Education: Goal: Knowledge about tracheostomy care/management will improve Outcome: Progressing   Problem: Activity: Goal: Ability to tolerate increased activity will improve Outcome: Progressing   Problem: Health Behavior/Discharge Planning: Goal: Ability to manage tracheostomy will improve Outcome: Progressing   Problem: Respiratory: Goal: Patent airway maintenance will improve Outcome: Progressing   Problem: Role Relationship: Goal: Ability to communicate will improve Outcome: Progressing   Problem: Education: Goal: Knowledge of disease and its progression will improve Outcome: Progressing Goal: Individualized Educational Video(s) Outcome: Progressing   Problem: Fluid Volume: Goal: Compliance with measures to maintain balanced fluid volume will improve Outcome: Progressing   Problem: Health Behavior/Discharge Planning: Goal: Ability to manage health-related needs will improve Outcome: Progressing   Problem: Nutritional: Goal: Ability to make healthy dietary choices will improve Outcome: Progressing   Problem: Clinical  Measurements: Goal: Complications related to the disease process, condition or treatment will be avoided or minimized Outcome: Progressing

## 2023-08-11 NOTE — Progress Notes (Signed)
 PROGRESS NOTE    Frank [name].  ZHY:[id number] DOB: 02-Jan-1966 DOA: 07/03/2023 PCP: Serita Danes, MD    Brief Narrative:  Patient with history of obstructive sleep apnea on CPAP, pathological fracture and multiple ORIF left humerus, hypertension,  hyperlipidemia and asthma who had been feeling weak for about 1 week, admitted to [facility] with concern for sepsis, community-acquired pneumonia and acute encephalopathy.  Patient was found to have lytic lesions, he was transferred to [facility] for oncology treatment.  In the meantime, he developed encephalopathy, acute renal failure and remained very sick.  He remained in the ICU for 36 days.   Patient underwent tracheostomy placement and now on trach collar. Patient is still on NG tube feeding.  Not stable enough for PEG tube placement. Patient is on dialysis now.  He is still encephalopathic.  4/17-admitted to medical floor with oncology consultation to treat multiple myeloma. 4/19 - transferred to ICU intubated, encephalopathic. 4/20 atrial fibrillation with RVR.  Anticoagulated with heparin .  Multiple blood transfusions needed due to groin hematoma and also hematoma at bone marrow biopsy site. EEG 4/25 >> moderate to severe encephalopathy without any seizures or epileptiform discharges 4/23 respiratory culture >> MRSE 4/28 remains intubated on CRRT, down to one pressor  4/29 labile BP, NE back up. Responded a bit to fluids. Received Velcade  and Cytoxan  chemotherapy for newly diagnosed multiple myeloma. 5/1 remains encephalopathic 5/2 LP and ETT exchange  5/5 1 PRBC. Palliative care met w family  5/6 IVIG. plan for trach established. Added midodrine  after failed vaso wean. Abx/fungal changed to levaquin   5/8 Trach placed. Some post op bleeding improved with thrombi-pad 5/10 voriconazole  added back based on CT Chest findings, MRI brain with no acute changes 5/13 more alert and able to follow some commands. 5/19 and  had been doing well on trach collar, and was on the verge of transferring to progressive when had a trach dislodgment. 5/22 more awake. 5/23, transferred to floor.  Micro: 4/28 BCx > no growth, final 4/30 respiratory culture-few PMN, rare budding yeast, abundant GPC> few aspergillus fumigatus Quantiferon 5/2 > neg Trach asp 5/4> rare GPC, rare mold  PCP smear trach asp 5/5 > neg  Trach asp 5/9 > abundant staph hemolyticus, few budding yeast Trach aspirate 5/15 - ngtd Blood cultures 5/11-no growth   CSF studies 1 RBC, 1 WBC, glucose 106 CSF culture-2 organisms acute, rare WBC> NGTD Cryptococcal antigen negative meningitis and encephalitis panel negative VDRL  non-reactive    XR left arm consistent with ORIF of pathologic fracture.  Extensive bone loss.  Subjective:  Patient seen and examined.  Startles.  Follows simple commands and tries to use right hand.  Now remaining less febrile after multiple doses of Tylenol  and cooling blanket.  Tolerating tube feeding.  On trach collar.  Assessment & Plan:   Acute hypoxic respiratory failure and respiratory insufficiency status post tracheostomy:  Patient currently remains on trach collar.  overall poor health.  Continue aggressive chest physiotherapy.  Pulmonary following.  Not a candidate for decannulation due to mental status.  Severe acute metabolic encephalopathy with hypoactive delirium, seizure disorder: Mental status waxing and waning.  MRI of the brain normal.  LP without evidence of meningitis or encephalitis. Patient underwent EEG.  Without evidence of seizure.  Patient was on Depakote  at home that was discontinued. 5/24, EEG shows severe metabolic depression, no seizures. Patient on Depakote  at home for bipolar disorder and seizure disorder.  This was discontinued in the  ICU.  Placing patient back on Depakote . Tremulousness and agitation improved with low-dose Klonopin  that we will continue.  Aspergillosis with pneumonia: on  voriconazole .  ID following.  Planning for prolonged therapies.  Also added acyclovir.  Multiple myeloma: Started on Velcade  and Cytoxan .  Followed by oncology.  He may be treatable for multiple myeloma, however his general condition is grave.  Myeloma kidney, acute kidney injury on chronic kidney disease stage IIIa: Baseline creatinine 1.1-1.5.  Creatinine 5.8 here.  AKI due to myeloma kidney and hypercalcemia, ACE inhibitors. CRRT/23.  Now on dialysis.  Tunneled catheter placed 5/22.   Paroxysmal A-fib, now in sinus rhythm.  Rate controlled.  Not on any rate control medications.  Unable to anticoagulate due to hematoma and intolerance.  Type 2 diabetes: On insulin  coverage.  Obstructive sleep apnea: Now with trach collar.  Goal of care: Previously seen by palliative care.  Remains in very poor clinical status.  Currently desires to continue all the treatments. Patient has core track tube which is there now for more than a month.  Will consult IR for PEG tube placement. 5/25, Met with patient's wife who is also an EMT and one of the patient's best friend.  Discussed in detail.  Patient's wife is quite knowledgeable about severity of condition and overall health.  Patient with poor mentation and poor chances of recovery and rehab now receiving chemotherapy and dialysis.  She is still hoping to give him a chance to recover.  Remains full code.  Discussed CODE STATUS.  She would like him to receive CPR at least for 3 rounds to give him chance to survive as she herself has saved remaining labs with CPR.    DVT prophylaxis: heparin  injection 5,000 Units Start: 07/25/23 0600 Place and maintain sequential compression device Start: 07/14/23 1033   Code Status: Full code Family Communication: None today.  Wife at the bedside yesterday. Disposition Plan: Status is: Inpatient Remains inpatient appropriate because: Critically ill     Consultants:  Critical  care ID Nephrology Oncology Palliative care  Procedures:  Multiple procedures as above  Antimicrobials:  Multiple antibiotics.  Currently on voriconazole  and acyclovir.     Objective: Vitals:   08/11/23 0315 08/11/23 0351 08/11/23 0700 08/11/23 0733  BP:  (!) 120/95    Pulse: 90 92    Resp: (!) 21 20    Temp:  98.4 F (36.9 C)  98.3 F (36.8 C)  TempSrc:  Axillary  Oral  SpO2: 99% 100%    Weight:   97.2 kg   Height:        Intake/Output Summary (Last 24 hours) at 08/11/2023 1218 Last data filed at 08/11/2023 0900 Gross per 24 hour  Intake 0 ml  Output 785 ml  Net -785 ml   Filed Weights   08/09/23 1911 08/10/23 0435 08/11/23 0700  Weight: 100.4 kg 96.9 kg 97.2 kg    Examination:  General exam: Sick looking.  Tremulous. Respiratory system: Coarse breath sounds.  Conducted airway sounds.  On trach collar.  Poor inspiratory effort. Cardiovascular system: S1 & S2 heard, RRR.  Tachycardic. Gastrointestinal system: Abdomen is nondistended, soft and nontender. No organomegaly or masses felt. Normal bowel sounds heard. Central nervous system: Alert on stimulation.  follows simple commands including mobilizing extremities.  Does not have any voice because of trach collar. Patient pulls right upper extremity and left lower extremity. Deformity and swelling left arm consistent with previous ORIF.  Restricted mobility. Pressure Injury 07/05/23 Heel Left;Right Unstageable -  Full thickness tissue loss in which the base of the injury is covered by slough (yellow, tan, gray, green or brown) and/or eschar (tan, brown or black) in the wound bed. (Active)  07/05/23 1108  Location: Heel  Location Orientation: Left;Right  Staging: Unstageable - Full thickness tissue loss in which the base of the injury is covered by slough (yellow, tan, gray, green or brown) and/or eschar (tan, brown or black) in the wound bed.  Wound Description (Comments):   Present on Admission: Yes      Pressure Injury 07/11/23 Buttocks Mid Unstageable - Full thickness tissue loss in which the base of the injury is covered by slough (yellow, tan, gray, green or brown) and/or eschar (tan, brown or black) in the wound bed. 8 cm x 6 cmx 0.1 cm (Active)  07/11/23 1600  Location: Buttocks  Location Orientation: Mid  Staging: Unstageable - Full thickness tissue loss in which the base of the injury is covered by slough (yellow, tan, gray, green or brown) and/or eschar (tan, brown or black) in the wound bed.  Wound Description (Comments): 8 cm x 6 cmx 0.1 cm  Present on Admission: No     Pressure Injury 07/27/23 Buttocks Right;Lower (Active)  07/27/23 1700  Location: Buttocks  Location Orientation: Right;Lower  Staging:   Wound Description (Comments):   Present on Admission: Yes  Fecal management system with loose stool.      Data Reviewed: I have personally reviewed following labs and imaging studies  CBC: Recent Labs  Lab 08/06/23 0205 08/09/23 0719 08/10/23 0711 08/11/23 1059  WBC 10.8* 8.7 9.1 6.8  NEUTROABS 7.0 5.7 7.5 5.5  HGB 9.4* 10.1* 10.4* 11.5*  HCT 30.4* 33.4* 33.2* 37.9*  MCV 100.3* 104.4* 100.0 102.4*  PLT 306 284 299 287   Basic Metabolic Panel: Recent Labs  Lab 08/07/23 0256 08/08/23 0450 08/09/23 0719 08/10/23 0711 08/11/23 1059  NA 136 132*  131* 137 134* 137  137  K 4.6 3.4*  3.4* 4.7 4.4 4.8  4.9  CL 101 96*  96* 100 96* 99  99  CO2 22 25  25  21* 24 21*  20*  GLUCOSE 199* 158*  158* 211* 201* 213*  218*  BUN 172* 101*  103* 165* 121* 166*  170*  CREATININE 4.90* 2.83*  2.86* 4.10* 3.28* 3.60*  3.61*  CALCIUM  7.9* 7.7*  7.6* 7.6* 7.9* 8.0*  8.2*  MG 2.7* 2.5* 2.6* 2.3 2.7*  PHOS 4.5 RESULTS UNAVAILABLE DUE TO INTERFERING SUBSTANCE 4.0 NOT CALCULATED 6.8*   GFR: Estimated Creatinine Clearance: 26.7 mL/min (A) (by C-G formula based on SCr of 3.6 mg/dL (H)). Liver Function Tests: Recent Labs  Lab 08/06/23 0205 08/06/23 0206  08/07/23 0256 08/08/23 0450 08/09/23 0719 08/10/23 0711 08/11/23 1059  AST 13*  --   --  15 15 16  14*  ALT 12  --   --  10 7 12 10   ALKPHOS 62  --   --  58 47 46 45  BILITOT 0.7  --   --  0.5 1.0 1.1 0.8  PROT 8.0  --   --  8.8* 8.1 8.7* 8.2*  ALBUMIN  2.0*   < > 1.9* 2.2*  2.2* 2.2* 2.4* 2.3*  2.3*   < > = values in this interval not displayed.   No results for input(s): "LIPASE", "AMYLASE" in the last 168 hours. No results for input(s): "AMMONIA" in the last 168 hours.  Coagulation Profile: No results for input(s): "INR", "PROTIME" in the last  168 hours. Cardiac Enzymes: No results for input(s): "CKTOTAL", "CKMB", "CKMBINDEX", "TROPONINI" in the last 168 hours. BNP (last 3 results) No results for input(s): "PROBNP" in the last 8760 hours. HbA1C: No results for input(s): "HGBA1C" in the last 72 hours. CBG: Recent Labs  Lab 08/10/23 1940 08/10/23 2344 08/11/23 0355 08/11/23 0732 08/11/23 1135  GLUCAP 231* 232* 166* 190* 203*   Lipid Profile: No results for input(s): "CHOL", "HDL", "LDLCALC", "TRIG", "CHOLHDL", "LDLDIRECT" in the last 72 hours. Thyroid Function Tests: No results for input(s): "TSH", "T4TOTAL", "FREET4", "T3FREE", "THYROIDAB" in the last 72 hours. Anemia Panel: No results for input(s): "VITAMINB12", "FOLATE", "FERRITIN", "TIBC", "IRON", "RETICCTPCT" in the last 72 hours. Sepsis Labs: No results for input(s): "PROCALCITON", "LATICACIDVEN" in the last 168 hours.  No results found for this or any previous visit (from the past 240 hours).        Radiology Studies: EEG adult Result Date: 08/10/2023 Arleene Lack, MD     08/10/2023  6:07 AM Patient Name: Frank [name]. MRN: [id number] Epilepsy Attending: Arleene Lack Referring Physician/Provider: Vada Garibaldi, MD Date: 08/09/2023 Duration: 24.42 mins Patient history: 58yo M with ams. EEG to evaluate for seizure Level of alertness: Awake/ lethargic AEDs during EEG study: None Technical aspects:  This EEG study was done with scalp electrodes positioned according to the 10-20 International system of electrode placement. Electrical activity was reviewed with band pass filter of 1-70Hz , sensitivity of 7 uV/mm, display speed of 67mm/sec with a 60Hz  notched filter applied as appropriate. EEG data were recorded continuously and digitally stored.  Video monitoring was available and reviewed as appropriate. Description: EEG showed continuous generalized 5 to 6 Hz theta slowing. Patient was noted to have episodes of brief sudden head jerking every few seconds. Concomitant EEG before, during and after the event did not show any EEG changes suggest seizure.  Hyperventilation and photic stimulation were not performed.   EEG was technically difficult due to significant eye flutter and movement artifact. ABNORMALITY - Continuous slow, generalized IMPRESSION: This technically difficult study is suggestive of moderate diffuse encephalopathy. No seizures or epileptiform discharges were seen throughout the recording. Patient was noted to have episodes of brief sudden head jerking every few seconds without concomitant EEG change. These events were most likely non epileptic. Priyanka O Yadav        Scheduled Meds:  acyclovir  200 mg Per Tube BID   arformoterol   15 mcg Nebulization BID   collagenase   Topical Daily   feeding supplement (PROSource TF20)  60 mL Per Tube BID   folic acid   1 mg Per Tube Daily   heparin  injection (subcutaneous)  5,000 Units Subcutaneous Q8H   insulin  aspart  0-20 Units Subcutaneous Q4H   insulin  aspart  4 Units Subcutaneous Q4H   insulin  glargine-yfgn  8 Units Subcutaneous BID   multivitamin  1 tablet Per Tube QHS   nicotine   7 mg Transdermal Daily   nutrition supplement (JUVEN)  1 packet Per Tube BID BM   mouth rinse  15 mL Mouth Rinse Q2H   PARoxetine   40 mg Per Tube Daily   pneumococcal 20-valent conjugate vaccine  0.5 mL Intramuscular Tomorrow-1000   revefenacin   175 mcg  Nebulization Daily   sodium chloride  flush  10-40 mL Intracatheter Q12H   sodium chloride  flush  10-40 mL Intracatheter Q12H   thiamine   100 mg Per Tube Daily   voriconazole   350 mg Per Tube Q12H   Continuous Infusions:  feeding supplement (  VITAL 1.5 CAL) 1,000 mL (08/11/23 0851)   valproate sodium  500 mg (08/11/23 1126)     LOS: 39 days    Time spent: 40 minutes    Vada Garibaldi, MD [facility]

## 2023-08-11 NOTE — Progress Notes (Signed)
 [facility] Progress Note   Assessment/ Plan:   # Oliguric AKI on CKD 3a - b/l creat 1.1- 1.5 from 2024. Creat here 5.8 on presentation. AKI due to myeloma kidney +/- hyperCa +/- ACEi. CRRT from 4/23--- 5/10.  on iHD now.   dialysis 5/22 following placement  tunneled HD catheter, also on 5/24-  - HD today 5/26 d/t catabolic state -   # AMS/acute metabolic encephalopathy: MRI, LP completed and unrevealing.  Waxes and wanes    #Volume -   Gentle UF moving forward   # Hypotension: normotensive,    # Multiple myeloma: started on Velcade  and Cytoxan ,  Per onc   #AHRF/ aspergillus PNA/ s/p trach 5/8: on voriconazole  IV, off all other IV antibiotics. Off the ventilator on trach collar, per CCM   # Atrial fib-  on amio   #Anemia - prn transfusions. On epogen  per onc.    # Persistent fevers with negative infectious workup thought to be related to malignancy  Subjective:    Seen in bed.  Encephalopathic.  For HD today.   Objective:   BP (!) 120/95 (BP Location: Right Arm)   Pulse 92   Temp 98.3 F (36.8 C) (Oral)   Resp 20   Ht 6' 0.01" (1.829 m)   Wt 97.2 kg   SpO2 100%   BMI 29.06 kg/m   Intake/Output Summary (Last 24 hours) at 08/11/2023 1349 Last data filed at 08/11/2023 0900 Gross per 24 hour  Intake 0 ml  Output 785 ml  Net -785 ml   Weight change: -5.2 kg  Physical Exam: Gen: NAD CVS: RRR Resp: on TC Abd: soft Ext: no LE edema ACCESS: [facility]  Imaging: EEG adult Result Date: 08/10/2023 Arleene Lack, MD     08/10/2023  6:07 AM Patient Name: Antoinette Batman. MRN: [id number] Epilepsy Attending: Arleene Lack Referring Physician/Provider: Vada Garibaldi, MD Date: 08/09/2023 Duration: 24.42 mins Patient history: 58yo M with ams. EEG to evaluate for seizure Level of alertness: Awake/ lethargic AEDs during EEG study: None Technical aspects: This EEG study was done with scalp electrodes positioned according to the 10-20 International system of electrode  placement. Electrical activity was reviewed with band pass filter of 1-70Hz , sensitivity of 7 uV/mm, display speed of 56mm/sec with a 60Hz  notched filter applied as appropriate. EEG data were recorded continuously and digitally stored.  Video monitoring was available and reviewed as appropriate. Description: EEG showed continuous generalized 5 to 6 Hz theta slowing. Patient was noted to have episodes of brief sudden head jerking every few seconds. Concomitant EEG before, during and after the event did not show any EEG changes suggest seizure.  Hyperventilation and photic stimulation were not performed.   EEG was technically difficult due to significant eye flutter and movement artifact. ABNORMALITY - Continuous slow, generalized IMPRESSION: This technically difficult study is suggestive of moderate diffuse encephalopathy. No seizures or epileptiform discharges were seen throughout the recording. Patient was noted to have episodes of brief sudden head jerking every few seconds without concomitant EEG change. These events were most likely non epileptic. Priyanka Suzanne Erps    Labs: BMET Recent Labs  Lab 08/05/23 1154 08/06/23 0205 08/06/23 0206 08/07/23 0256 08/08/23 0450 08/09/23 0719 08/10/23 0711 08/11/23 1059  NA 134* 137 139 136 132*  131* 137 134* 137  137  K 3.9 4.2 4.3 4.6 3.4*  3.4* 4.7 4.4 4.8  4.9  CL 98 98 99 101 96*  96* 100 96* 99  99  CO2 24 23 23 22 25  25  21* 24 21*  20*  GLUCOSE 196* 167* 166* 199* 158*  158* 211* 201* 213*  218*  BUN 111* 136* 137* 172* 101*  103* 165* 121* 166*  170*  CREATININE 4.08* 4.67* 4.55* 4.90* 2.83*  2.86* 4.10* 3.28* 3.60*  3.61*  CALCIUM  8.0* 8.1* 8.2* 7.9* 7.7*  7.6* 7.6* 7.9* 8.0*  8.2*  PHOS RESULTS UNAVAILABLE DUE TO INTERFERING SUBSTANCE  --  3.9 4.5 RESULTS UNAVAILABLE DUE TO INTERFERING SUBSTANCE 4.0 NOT CALCULATED 6.8*   CBC Recent Labs  Lab 08/06/23 0205 08/09/23 0719 08/10/23 0711 08/11/23 1059  WBC 10.8* 8.7 9.1 6.8   NEUTROABS 7.0 5.7 7.5 5.5  HGB 9.4* 10.1* 10.4* 11.5*  HCT 30.4* 33.4* 33.2* 37.9*  MCV 100.3* 104.4* 100.0 102.4*  PLT 306 284 299 287    Medications:     acyclovir  200 mg Per Tube BID   arformoterol   15 mcg Nebulization BID   collagenase   Topical Daily   feeding supplement (PROSource TF20)  60 mL Per Tube BID   folic acid   1 mg Per Tube Daily   heparin  injection (subcutaneous)  5,000 Units Subcutaneous Q8H   insulin  aspart  0-20 Units Subcutaneous Q4H   insulin  aspart  4 Units Subcutaneous Q4H   insulin  glargine-yfgn  8 Units Subcutaneous BID   multivitamin  1 tablet Per Tube QHS   nicotine   7 mg Transdermal Daily   nutrition supplement (JUVEN)  1 packet Per Tube BID BM   mouth rinse  15 mL Mouth Rinse Q2H   PARoxetine   40 mg Per Tube Daily   pneumococcal 20-valent conjugate vaccine  0.5 mL Intramuscular Tomorrow-1000   revefenacin   175 mcg Nebulization Daily   sodium chloride  flush  10-40 mL Intracatheter Q12H   sodium chloride  flush  10-40 mL Intracatheter Q12H   thiamine   100 mg Per Tube Daily   voriconazole   350 mg Per Tube Q12H    Leandra Pro, MD 08/11/2023, 1:49 PM

## 2023-08-12 DIAGNOSIS — B49 Unspecified mycosis: Secondary | ICD-10-CM

## 2023-08-12 DIAGNOSIS — J9503 Malfunction of tracheostomy stoma: Secondary | ICD-10-CM

## 2023-08-12 DIAGNOSIS — N179 Acute kidney failure, unspecified: Secondary | ICD-10-CM | POA: Diagnosis not present

## 2023-08-12 DIAGNOSIS — J9601 Acute respiratory failure with hypoxia: Secondary | ICD-10-CM | POA: Diagnosis not present

## 2023-08-12 DIAGNOSIS — C9000 Multiple myeloma not having achieved remission: Secondary | ICD-10-CM | POA: Diagnosis not present

## 2023-08-12 DIAGNOSIS — Z515 Encounter for palliative care: Secondary | ICD-10-CM | POA: Diagnosis not present

## 2023-08-12 DIAGNOSIS — E43 Unspecified severe protein-calorie malnutrition: Secondary | ICD-10-CM

## 2023-08-12 DIAGNOSIS — G9341 Metabolic encephalopathy: Secondary | ICD-10-CM | POA: Diagnosis not present

## 2023-08-12 DIAGNOSIS — Z7189 Other specified counseling: Secondary | ICD-10-CM | POA: Diagnosis not present

## 2023-08-12 DIAGNOSIS — Z9911 Dependence on respirator [ventilator] status: Secondary | ICD-10-CM

## 2023-08-12 DIAGNOSIS — R531 Weakness: Secondary | ICD-10-CM | POA: Diagnosis not present

## 2023-08-12 LAB — CBC WITH DIFFERENTIAL/PLATELET
Abs Immature Granulocytes: 0.06 10*3/uL (ref 0.00–0.07)
Basophils Absolute: 0 10*3/uL (ref 0.0–0.1)
Basophils Relative: 0 %
Eosinophils Absolute: 0 10*3/uL (ref 0.0–0.5)
Eosinophils Relative: 0 %
HCT: 34.3 % — ABNORMAL LOW (ref 39.0–52.0)
Hemoglobin: 10.9 g/dL — ABNORMAL LOW (ref 13.0–17.0)
Immature Granulocytes: 1 %
Lymphocytes Relative: 11 %
Lymphs Abs: 0.9 10*3/uL (ref 0.7–4.0)
MCH: 31.6 pg (ref 26.0–34.0)
MCHC: 31.8 g/dL (ref 30.0–36.0)
MCV: 99.4 fL (ref 80.0–100.0)
Monocytes Absolute: 0.5 10*3/uL (ref 0.1–1.0)
Monocytes Relative: 5 %
Neutro Abs: 6.9 10*3/uL (ref 1.7–7.7)
Neutrophils Relative %: 83 %
Platelets: 259 10*3/uL (ref 150–400)
RBC: 3.45 MIL/uL — ABNORMAL LOW (ref 4.22–5.81)
RDW: 21.8 % — ABNORMAL HIGH (ref 11.5–15.5)
WBC: 8.3 10*3/uL (ref 4.0–10.5)
nRBC: 0.2 % (ref 0.0–0.2)

## 2023-08-12 LAB — COMPREHENSIVE METABOLIC PANEL WITH GFR
ALT: 11 U/L (ref 0–44)
AST: 13 U/L — ABNORMAL LOW (ref 15–41)
Albumin: 2.4 g/dL — ABNORMAL LOW (ref 3.5–5.0)
Alkaline Phosphatase: 43 U/L (ref 38–126)
Anion gap: 12 (ref 5–15)
BUN: 117 mg/dL — ABNORMAL HIGH (ref 6–20)
CO2: 23 mmol/L (ref 22–32)
Calcium: 8.7 mg/dL — ABNORMAL LOW (ref 8.9–10.3)
Chloride: 97 mmol/L — ABNORMAL LOW (ref 98–111)
Creatinine, Ser: 2.64 mg/dL — ABNORMAL HIGH (ref 0.61–1.24)
GFR, Estimated: 27 mL/min — ABNORMAL LOW (ref >60)
Glucose, Bld: 185 mg/dL — ABNORMAL HIGH (ref 70–99)
Potassium: 4.2 mmol/L (ref 3.5–5.1)
Sodium: 132 mmol/L — ABNORMAL LOW (ref 135–145)
Total Bilirubin: 0.9 mg/dL (ref 0.0–1.2)
Total Protein: 8.5 g/dL — ABNORMAL HIGH (ref 6.5–8.1)

## 2023-08-12 LAB — GLUCOSE, CAPILLARY
Glucose-Capillary: 191 mg/dL — ABNORMAL HIGH (ref 70–99)
Glucose-Capillary: 184 mg/dL — ABNORMAL HIGH (ref 70–99)
Glucose-Capillary: 207 mg/dL — ABNORMAL HIGH (ref 70–99)
Glucose-Capillary: 178 mg/dL — ABNORMAL HIGH (ref 70–99)
Glucose-Capillary: 172 mg/dL — ABNORMAL HIGH (ref 70–99)

## 2023-08-12 LAB — VALPROIC ACID LEVEL: Valproic Acid Lvl: 22 ug/mL — ABNORMAL LOW (ref 50–100)

## 2023-08-12 LAB — AMMONIA: Ammonia: 14 umol/L (ref 9–35)

## 2023-08-12 LAB — LACTATE DEHYDROGENASE: LDH: 359 U/L — ABNORMAL HIGH (ref 98–192)

## 2023-08-12 MED ORDER — PALONOSETRON HCL INJECTION 0.25 MG/5ML
0.2500 mg | Freq: Once | INTRAVENOUS | Status: AC
  Administered 2023-08-12: 0.25 mg via INTRAVENOUS
  Filled 2023-08-12: qty 5

## 2023-08-12 MED ORDER — ONDANSETRON HCL 4 MG/2ML IJ SOLN
4.0000 mg | Freq: Four times a day (QID) | INTRAMUSCULAR | Status: DC | PRN
  Administered 2023-09-07 – 2023-10-20 (×2): 4 mg via INTRAVENOUS
  Filled 2023-08-12: qty 2

## 2023-08-12 MED ORDER — BORTEZOMIB CHEMO SQ INJECTION 3.5 MG (2.5MG/ML)
1.3000 mg/m2 | Freq: Once | INTRAMUSCULAR | Status: AC
  Administered 2023-08-12: 3 mg via SUBCUTANEOUS
  Filled 2023-08-12: qty 1.2

## 2023-08-12 MED ORDER — SODIUM CHLORIDE 0.9 % IV SOLN
900.0000 mg | Freq: Once | INTRAVENOUS | Status: AC
  Administered 2023-08-12: 900 mg via INTRAVENOUS
  Filled 2023-08-12: qty 45

## 2023-08-12 MED ORDER — ORAL CARE MOUTH RINSE
15.0000 mL | OROMUCOSAL | Status: DC
  Administered 2023-08-12 – 2023-11-07 (×292): 15 mL via OROMUCOSAL

## 2023-08-12 MED ORDER — NUTRISOURCE FIBER PO PACK
1.0000 | PACK | Freq: Two times a day (BID) | ORAL | Status: DC
  Administered 2023-08-12 – 2023-08-17 (×11): 1
  Filled 2023-08-12 (×12): qty 1

## 2023-08-12 MED ORDER — ORAL CARE MOUTH RINSE: 15.0000 mL | OROMUCOSAL | Status: DC | PRN

## 2023-08-12 NOTE — Progress Notes (Signed)
 Mr Ruark received his velcade /cytoxan  chemo treatment today. He tolerated treatment well with no issues.

## 2023-08-12 NOTE — Progress Notes (Signed)
 PROGRESS NOTE  Frank [name]. AVW:[id number] DOB: Jul 20, 1965   PCP: Serita Danes, MD  Patient is from: Home  DOA: 07/03/2023 LOS: 40  Chief complaints No chief complaint on file.    Brief Narrative / Interim history: 58 year old M with PMH of OSA on CPAP, pathological fracture and multiple ORIF left humerus likely due to multiple myeloma, CKD-3A, A-fib not on AC, asthma, HTN, HLD and tobacco use disorder who had been feeling weak for about 1 week, admitted to [facility] with concern for sepsis, community-acquired pneumonia and acute encephalopathy.  Patient was found to have lytic lesions, he was transferred to [facility] for oncology treatment.  In the meantime, he developed encephalopathy, acute renal failure and remained very sick.  He remained in the ICU for 36 days.  Underwent tracheostomy. Deemed to be not stable for PEG tube placement.  He has been on tube feeds via cortrack.  Also started on dialysis after CRRT.   Significant events 4/17-admitted to medical floor with oncology consultation to treat multiple myeloma. 4/19-transferred to ICU intubated, encephalopathic. 4/20-A-fib with RVR.  Started heparin . Groin and biopsy site hematoma requiring multiple transfusions EEG 4/25 >> moderate to severe encephalopathy without any seizures or epileptiform discharges 4/23 respiratory culture >> MRSE 4/28 remains intubated on CRRT, down to one pressor  4/29 Received Velcade  and Cytoxan  chemotherapy for newly diagnosed multiple myeloma. 5/2 LP and ETT exchange  5/5 1 PRBC. Palliative care met w family  5/6 IVIG.  Added midodrine  after failed vaso wean. Abx/fungal changed to levaquin   5/8 Trach placed. Some post op bleeding improved with thrombi-pad 5/10 voriconazole  added back based on CT Chest findings, MRI brain with no acute changes 5/13 more alert and able to follow some commands. 5/19 trach dislodgment. 5/22 more awake. 5/23, transferred to  floor.  Subjective: Seen and examined earlier this morning.  No major events overnight or this morning.  Patient is not vocal due to tracheostomy.  Has not started Passy-Muir yet.  Palliative, patient's sister and family and at bedside.  No questions.   Objective: Vitals:   08/12/23 0702 08/12/23 0723 08/12/23 0754 08/12/23 1103  BP:   125/88 117/89  Pulse:   (!) 105 (!) 106  Resp:  20 20 (!) 26  Temp:   (!) 100.5 F (38.1 C) (!) 100.4 F (38 C)  TempSrc:   Oral Oral  SpO2:   98% 97%  Weight: 96.5 kg     Height:        Examination:  GENERAL: No apparent distress.  Nontoxic. HEENT: MMM.  Vision and hearing grossly intact.  Cortrack. NECK: Tracheostomy in place. RESP:  No IWOB.  Very rhonchorous mainly due to secretion. CVS:  RRR. Heart sounds normal.  ABD/GI/GU: BS+. Abd soft, NTND.  Rectal tube. MSK/EXT: Moves right arm.  Left arm in brace.  Minimally moves BLE.  No edema.  NEURO: AA.  Not able to test orientation.  No facial asymmetry.  PERRL.  Follows commands.  Extremity exam as above. PSYCH: Calm.  No distress or agitation.  Consultants:  Neurology Nephrology Oncology Interventional radiology Infectious disease Palliative   Microbiology summarized: 4/28 BCx > no growth, final 4/30 respiratory culture-few PMN, rare budding yeast, abundant GPC> few aspergillus fumigatus 5/2-Quantiferon  neg 5/4-trach asp  rare GPC, rare mold  5/5-PCP smear trach asp neg  5/9-trach asp abundant staph hemolyticus, few budding yeast 5/11-blood cultures negative 5/15-trach aspirate negative   CSF studies 1 RBC, 1 WBC, glucose 106  CSF culture-2 organisms acute, rare WBC> NGTD Cryptococcal antigen negative meningitis and encephalitis panel negative VDRL  non-reactive   Assessment and plan: Acute hypoxic respiratory failure and respiratory insufficiency status post tracheostomy:  -On trach collar. -Continue chest physiotherapy -Pulmonology following intermittently.  Not a  candidate for decannulation yet   Severe acute metabolic encephalopathy with hypoactive delirium, seizure disorder: Normal MRI brain.  LP negative for meningitis or encephalitis.  EEG negative for seizure but encephalopathy.  Home Depakote  discontinued in ICU.  Tremor and agitation reportedly improved with Klonopin .  Currently awake and alert but not able to answer orientation questions.  Follows commands. 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  ID following.  Planning for prolonged therapies.  Also added acyclovir.   Multiple myeloma: Started on Velcade  and Cytoxan .  Followed by oncology.    AKI on CKD-3A: Due to multiple myeloma, hypercalcemia and ACE inhibitors? -S/p CRRT while in ICU.  Tunneled HD placed on 5/22.  Now on HD -Nephrology managing   Paroxysmal A-fib: Now in sinus rhythm.  Rate controlled without meds.   Unable to anticoagulate due to hematoma and intolerance.   Type 2 diabetes: On insulin  coverage.   Obstructive sleep apnea: Now with trach collar.  Recurrent fever: Evaluated by ID.  Felt to be due to malignancy. - Symptomatic management  Anxiety/bipolar disorder - Continue home Paxil  - Continue Depakote  - Low-dose Klonopin    Goal of care: Poor long-term prognosis.  Palliative from prior providers had discussion with patient's wife who is an EMT.  Remains full code with full scope of care. -IR consulted for PEG tube placement on 5/25   Severe malnutrition Body mass index is 28.85 kg/m. Nutrition Problem: Severe Malnutrition Etiology: acute illness (prolonged illness, interuptions in feeding) Signs/Symptoms: moderate fat depletion, moderate muscle depletion, percent weight loss (9.6% x 1 month) Percent weight loss: 9.6 % Interventions: Refer to RD note for recommendations, Tube feeding  Pressure skin injury: Pressure Injury 07/05/23 Heel Left;Right  Unstageable - Full thickness tissue loss in which the base of the injury is covered by slough (yellow, tan, gray, green or brown) and/or eschar (tan, brown or black) in the wound bed. (Active)  07/05/23 1108  Location: Heel  Location Orientation: Left;Right  Staging: Unstageable - Full thickness tissue loss in which the base of the injury is covered by slough (yellow, tan, gray, green or brown) and/or eschar (tan, brown or black) in the wound bed.  Wound Description (Comments):   Present on Admission: Yes  Dressing Type Foam - Lift dressing to assess site every shift 08/12/23 0820     Pressure Injury 07/11/23 Buttocks Mid Unstageable - Full thickness tissue loss in which the base of the injury is covered by slough (yellow, tan, gray, green or brown) and/or eschar (tan, brown or black) in the wound bed. 8 cm x 6 cmx 0.1 cm (Active)  07/11/23 1600  Location: Buttocks  Location Orientation: Mid  Staging: Unstageable - Full thickness tissue loss in which the base of the injury is covered by slough (yellow, tan, gray, green or brown) and/or eschar (tan, brown or black) in the wound bed.  Wound Description (Comments): 8 cm x 6 cmx 0.1 cm  Present on Admission: No  Dressing Type Foam - Lift dressing to assess site every shift 08/12/23 0820     Pressure Injury 07/27/23 Buttocks Right;Lower (Active)  07/27/23 1700  Location: Buttocks  Location Orientation: Right;Lower  Staging:  Wound Description (Comments):   Present on Admission: Yes  Dressing Type Foam - Lift dressing to assess site every shift 08/12/23 0820   DVT prophylaxis:  heparin  injection 5,000 Units Start: 07/25/23 0600 Place and maintain sequential compression device Start: 07/14/23 1033  Code Status: Full code Family Communication: Updated sister and other family member at bedside Level of care: Progressive Status is: Inpatient Remains inpatient appropriate because: Respiratory failure, encephalopathy, renal failure   Final  disposition: To be determined.   55 minutes with more than 50% spent in reviewing records, counseling patient/family and coordinating care.   Sch Meds:  Scheduled Meds:  acyclovir  200 mg Per Tube BID   arformoterol   15 mcg Nebulization BID   collagenase   Topical Daily   feeding supplement (PROSource TF20)  60 mL Per Tube BID   folic acid   1 mg Per Tube Daily   heparin  injection (subcutaneous)  5,000 Units Subcutaneous Q8H   insulin  aspart  0-20 Units Subcutaneous Q4H   insulin  aspart  4 Units Subcutaneous Q4H   insulin  glargine-yfgn  8 Units Subcutaneous BID   multivitamin  1 tablet Per Tube QHS   nicotine   7 mg Transdermal Daily   nutrition supplement (JUVEN)  1 packet Per Tube BID BM   mouth rinse  15 mL Mouth Rinse 4 times per day   PARoxetine   40 mg Per Tube Daily   pneumococcal 20-valent conjugate vaccine  0.5 mL Intramuscular Tomorrow-1000   revefenacin   175 mcg Nebulization Daily   sodium chloride  flush  10-40 mL Intracatheter Q12H   sodium chloride  flush  10-40 mL Intracatheter Q12H   thiamine   100 mg Per Tube Daily   voriconazole   350 mg Per Tube Q12H   Continuous Infusions:  feeding supplement (VITAL 1.5 CAL) 65 mL/hr at 08/12/23 1252   valproate sodium  Stopped (08/12/23 0930)   PRN Meds:.acetaminophen  (TYLENOL ) oral liquid 160 mg/5 mL **OR** acetaminophen , clonazePAM , ipratropium-albuterol , [START ON 08/15/2023] ondansetron  (ZOFRAN ) IV, mouth rinse, oxyCODONE , polyvinyl alcohol , sodium chloride  flush, sodium chloride  flush  Antimicrobials: Anti-infectives (From admission, onward)    Start     Dose/Rate Route Frequency Ordered Stop   08/09/23 1000  acyclovir (ZOVIRAX) 200 MG capsule 200 mg       Note to Pharmacy: Give after dialysis the pm dose, on dialysis days   200 mg Per Tube 2 times daily 08/08/23 2358     08/09/23 0100  acyclovir (ZOVIRAX) 200 MG capsule 200 mg       Note to Pharmacy: Give after dialysis the pm dose, on dialysis days   200 mg Per Tube   Once 08/09/23 0009 08/09/23 0013   08/08/23 2345  acyclovir (ZOVIRAX) 200 MG capsule 200 mg  Status:  Discontinued       Note to Pharmacy: Give after dialysis the pm dose, on dialysis days   200 mg Oral 2 times daily 08/08/23 2257 08/08/23 2358   08/07/23 1530  ceFAZolin  (ANCEF ) IVPB 2g/100 mL premix        over 30 Minutes Intravenous Continuous PRN 08/07/23 1555 08/07/23 1530   08/05/23 2200  voriconazole  (VFEND ) tablet 350 mg        350 mg Per Tube Every 12 hours 08/05/23 1511     08/01/23 1200  vancomycin  (VANCOCIN ) IVPB 1000 mg/200 mL premix        1,000 mg 200 mL/hr over 60 Minutes Intravenous Every M-W-F (Hemodialysis) 07/31/23 0649 08/01/23 1652   07/30/23 1200  vancomycin  (VANCOCIN ) IVPB 1000  mg/200 mL premix        1,000 mg 200 mL/hr over 60 Minutes Intravenous Every M-W-F (Hemodialysis) 07/30/23 0851 07/30/23 1241   07/29/23 0900  vancomycin  (VANCOCIN ) IVPB 1000 mg/200 mL premix        1,000 mg 200 mL/hr over 60 Minutes Intravenous  Once 07/29/23 0812 07/29/23 0943   07/28/23 1204  vancomycin  variable dose per unstable renal function (pharmacist dosing)  Status:  Discontinued         Does not apply See admin instructions 07/28/23 1204 07/31/23 0649   07/28/23 1000  voriconazole  (VFEND ) 350 mg in sodium chloride  0.9 % 100 mL IVPB  Status:  Discontinued       Placed in "Followed by" Linked Group   4 mg/kg  88.6 kg (Adjusted) 67.5 mL/hr over 120 Minutes Intravenous Every 12 hours 07/28/23 0748 08/05/23 1511   07/27/23 1000  voriconazole  (VFEND ) 400 mg in sodium chloride  0.9 % 100 mL IVPB  Status:  Discontinued       Placed in "Followed by" Linked Group   4 mg/kg  100.6 kg 70 mL/hr over 120 Minutes Intravenous Every 12 hours 07/26/23 0826 07/28/23 0748   07/27/23 0945  vancomycin  (VANCOREADY) IVPB 2000 mg/400 mL        2,000 mg 200 mL/hr over 120 Minutes Intravenous  Once 07/27/23 0849 07/27/23 1237   07/26/23 1515  Ampicillin -Sulbactam (UNASYN ) 3 g in sodium chloride  0.9 % 100  mL IVPB  Status:  Discontinued        3 g 200 mL/hr over 30 Minutes Intravenous Every 8 hours 07/26/23 1418 07/27/23 0830   07/26/23 0915  voriconazole  (VFEND ) 600 mg in sodium chloride  0.9 % 150 mL IVPB       Placed in "Followed by" Linked Group   6 mg/kg  100.6 kg 105 mL/hr over 120 Minutes Intravenous Every 12 hours 07/26/23 0826 07/27/23 0048   07/26/23 0900  Ampicillin -Sulbactam (UNASYN ) 3 g in sodium chloride  0.9 % 100 mL IVPB  Status:  Discontinued        3 g 200 mL/hr over 30 Minutes Intravenous Every 12 hours 07/26/23 0826 07/26/23 1418   07/23/23 1000  levofloxacin  (LEVAQUIN ) IVPB 500 mg  Status:  Discontinued       Placed in "Followed by" Linked Group   500 mg 100 mL/hr over 60 Minutes Intravenous Every 24 hours 07/22/23 1213 07/23/23 1001   07/23/23 1000  levofloxacin  (LEVAQUIN ) IVPB 500 mg  Status:  Discontinued       Placed in "Followed by" Linked Group   500 mg 100 mL/hr over 60 Minutes Intravenous Every 24 hours 07/23/23 1001 07/28/23 0804   07/22/23 1300  levofloxacin  (LEVAQUIN ) IVPB 750 mg       Placed in "Followed by" Linked Group   750 mg 100 mL/hr over 90 Minutes Intravenous  Once 07/22/23 1213 07/22/23 1542   07/21/23 1600  voriconazole  (VFEND ) 300 mg in sodium chloride  0.9 % 100 mL IVPB  Status:  Discontinued        300 mg 65 mL/hr over 120 Minutes Intravenous Every 12 hours 07/21/23 1430 07/22/23 1415   07/21/23 1000  voriconazole  (VFEND ) tablet 200 mg  Status:  Discontinued        200 mg Oral Every 12 hours 07/20/23 0944 07/21/23 1015   07/20/23 1030  voriconazole  (VFEND ) 530 mg in sodium chloride  0.9 % 150 mL IVPB        6 mg/kg  88.6 kg (Adjusted) 101.5 mL/hr over  120 Minutes Intravenous Every 12 hours 07/20/23 0944 07/21/23 0030   07/19/23 1400  meropenem  (MERREM ) 1 g in sodium chloride  0.9 % 100 mL IVPB  Status:  Discontinued        1 g 200 mL/hr over 30 Minutes Intravenous Every 8 hours 07/19/23 1143 07/22/23 1213   07/18/23 0600  meropenem  (MERREM ) 1  g in sodium chloride  0.9 % 100 mL IVPB  Status:  Discontinued        1 g 200 mL/hr over 30 Minutes Intravenous Every 24 hours 07/17/23 0916 07/19/23 1143   07/17/23 0700  meropenem  (MERREM ) 1 g in sodium chloride  0.9 % 100 mL IVPB  Status:  Discontinued        1 g 200 mL/hr over 30 Minutes Intravenous Every 12 hours 07/17/23 0601 07/17/23 0916   07/15/23 1300  vancomycin  (VANCOCIN ) IVPB 1000 mg/200 mL premix  Status:  Discontinued        1,000 mg 200 mL/hr over 60 Minutes Intravenous Every 24 hours 07/14/23 1129 07/22/23 1319   07/14/23 1215  vancomycin  (VANCOREADY) IVPB 1750 mg/350 mL        1,750 mg 175 mL/hr over 120 Minutes Intravenous  Once 07/14/23 1129 07/14/23 1449   07/09/23 1800  piperacillin -tazobactam (ZOSYN ) IVPB 3.375 g  Status:  Discontinued        3.375 g 12.5 mL/hr over 240 Minutes Intravenous Every 12 hours 07/09/23 0756 07/09/23 0844   07/09/23 1400  piperacillin -tazobactam (ZOSYN ) IVPB 3.375 g  Status:  Discontinued        3.375 g 12.5 mL/hr over 240 Minutes Intravenous Every 8 hours 07/09/23 0844 07/17/23 0557   07/08/23 1300  piperacillin -tazobactam (ZOSYN ) IVPB 3.375 g  Status:  Discontinued        3.375 g 12.5 mL/hr over 240 Minutes Intravenous Every 8 hours 07/08/23 1129 07/09/23 0756   07/05/23 2200  azithromycin  (ZITHROMAX ) 500 mg in sodium chloride  0.9 % 250 mL IVPB        500 mg 250 mL/hr over 60 Minutes Intravenous Daily at bedtime 07/05/23 2022 07/06/23 2302   07/05/23 2115  azithromycin  (ZITHROMAX ) 250 mg in dextrose  5 % 125 mL IVPB  Status:  Discontinued       Note to Pharmacy: Missed dose this morning   250 mg 127.5 mL/hr over 60 Minutes Intravenous Every 24 hours 07/05/23 2016 07/05/23 2021   07/04/23 0900  fluconazole  (DIFLUCAN ) IVPB 200 mg  Status:  Discontinued        200 mg 100 mL/hr over 60 Minutes Intravenous Every 48 hours 07/04/23 0722 07/09/23 0809   07/03/23 0600  cefTRIAXone  (ROCEPHIN ) 1 g in sodium chloride  0.9 % 100 mL IVPB  Status:   Discontinued        1 g 200 mL/hr over 30 Minutes Intravenous Every 24 hours 07/03/23 0508 07/08/23 1104   07/03/23 0600  azithromycin  (ZITHROMAX ) tablet 250 mg  Status:  Discontinued        250 mg Oral Daily 07/03/23 0508 07/05/23 2016        I have personally reviewed the following labs and images: CBC: Recent Labs  Lab 08/06/23 0205 08/09/23 0719 08/10/23 0711 08/11/23 1059 08/12/23 0525  WBC 10.8* 8.7 9.1 6.8 8.3  NEUTROABS 7.0 5.7 7.5 5.5 6.9  HGB 9.4* 10.1* 10.4* 11.5* 10.9*  HCT 30.4* 33.4* 33.2* 37.9* 34.3*  MCV 100.3* 104.4* 100.0 102.4* 99.4  PLT 306 284 299 287 259   BMP &GFR Recent Labs  Lab 08/07/23 0256 08/08/23  1610 08/09/23 0719 08/10/23 0711 08/11/23 1059 08/12/23 0525  NA 136 132*  131* 137 134* 137  137 132*  K 4.6 3.4*  3.4* 4.7 4.4 4.8  4.9 4.2  CL 101 96*  96* 100 96* 99  99 97*  CO2 22 25  25  21* 24 21*  20* 23  GLUCOSE 199* 158*  158* 211* 201* 213*  218* 185*  BUN 172* 101*  103* 165* 121* 166*  170* 117*  CREATININE 4.90* 2.83*  2.86* 4.10* 3.28* 3.60*  3.61* 2.64*  CALCIUM  7.9* 7.7*  7.6* 7.6* 7.9* 8.0*  8.2* 8.7*  MG 2.7* 2.5* 2.6* 2.3 2.7*  --   PHOS 4.5 RESULTS UNAVAILABLE DUE TO INTERFERING SUBSTANCE 4.0 NOT CALCULATED 6.8*  --    Estimated Creatinine Clearance: 36.3 mL/min (A) (by C-G formula based on SCr of 2.64 mg/dL (H)). Liver & Pancreas: Recent Labs  Lab 08/08/23 0450 08/09/23 0719 08/10/23 0711 08/11/23 1059 08/12/23 0525  AST 15 15 16  14* 13*  ALT 10 7 12 10 11   ALKPHOS 58 47 46 45 43  BILITOT 0.5 1.0 1.1 0.8 0.9  PROT 8.8* 8.1 8.7* 8.2* 8.5*  ALBUMIN  2.2*  2.2* 2.2* 2.4* 2.3*  2.3* 2.4*   No results for input(s): "LIPASE", "AMYLASE" in the last 168 hours. No results for input(s): "AMMONIA" in the last 168 hours. Diabetic: No results for input(s): "HGBA1C" in the last 72 hours. Recent Labs  Lab 08/11/23 2206 08/11/23 2348 08/12/23 0411 08/12/23 0753 08/12/23 1152  GLUCAP 237* 279* 191*  184* 207*   Cardiac Enzymes: No results for input(s): "CKTOTAL", "CKMB", "CKMBINDEX", "TROPONINI" in the last 168 hours. No results for input(s): "PROBNP" in the last 8760 hours. Coagulation Profile: No results for input(s): "INR", "PROTIME" in the last 168 hours. Thyroid Function Tests: No results for input(s): "TSH", "T4TOTAL", "FREET4", "T3FREE", "THYROIDAB" in the last 72 hours. Lipid Profile: No results for input(s): "CHOL", "HDL", "LDLCALC", "TRIG", "CHOLHDL", "LDLDIRECT" in the last 72 hours. Anemia Panel: No results for input(s): "VITAMINB12", "FOLATE", "FERRITIN", "TIBC", "IRON", "RETICCTPCT" in the last 72 hours. Urine analysis:    Component Value Date/Time   COLORURINE STRAW (A) 07/05/2023 1912   APPEARANCEUR CLEAR 07/05/2023 1912   LABSPEC 1.008 07/05/2023 1912   PHURINE 7.0 07/05/2023 1912   GLUCOSEU NEGATIVE 07/05/2023 1912   HGBUR MODERATE (A) 07/05/2023 1912   BILIRUBINUR NEGATIVE 07/05/2023 1912   KETONESUR NEGATIVE 07/05/2023 1912   PROTEINUR 30 (A) 07/05/2023 1912   NITRITE NEGATIVE 07/05/2023 1912   LEUKOCYTESUR NEGATIVE 07/05/2023 1912   Sepsis Labs: Invalid input(s): "PROCALCITONIN", "LACTICIDVEN"  Microbiology: No results found for this or any previous visit (from the past 240 hours).  Radiology Studies: No results found.    Miyo Aina T. Latroy Gaymon [facility]  If 7PM-7AM, please contact night-coverage [email or web address] 08/12/2023, 1:34 PM

## 2023-08-12 NOTE — Progress Notes (Signed)
 Physical Therapy Treatment Patient Details Name: Frank [name]. MRN: [id number] DOB: 23-Jan-1966 Today's Date: 08/12/2023   History of Present Illness 58 y.o. male transferred from [facility] 07/03/23 to [facility] for management of newly diagnosed plasma cell myeloma with weakness and AMS. Pt also with AKI and metabolic encephalopathy. 5/10 transfer to [facility] for iHD. 4/22 Intubated and chemo initiated. 4/23-5/10 CRRT. 5/6 IVIG started. 5/8 trach. 5/12 iHD initiated. 5/18 inability to oxygenate through trach, intubated and trach revision. PMHx:Lt THA, carpal tunnel syndrome, Lt TKA, Lt humerus fx, PAF, HTN, HLD, bipolar disorder, obesity, T2DM    PT Comments  Pt following PT with visual tracking today, better on L vs R. Pt agreeable to bed-level exercises, PT placing pt in chair position for session for pressure relief and tolerance to upright. Pt engaged and participatory in LE exercises, L moreso than R, and follows mobility commands on all extremities except LUE. Pt noted to have slight SBP drop after being upright, lowered back to 35 deg at end of session. Pt noted to be soiled, PT called for assist but no one available, RN notified that pt needs to be cleaned up. PT to continue to follow.   SBP 118 with HOB at 30 deg, SBP 97 with HOB 51 deg, SBP return to 117 with recline to 35 deg      If plan is discharge home, recommend the following: Two people to help with walking and/or transfers;Two people to help with bathing/dressing/bathroom;Assistance with cooking/housework;Assistance with feeding;Assist for transportation;Direct supervision/assist for financial management;Direct supervision/assist for medications management   Can travel by private vehicle        Equipment Recommendations  Hospital bed;Hoyer lift    Recommendations for Other Services       Precautions / Restrictions Precautions Precautions: Fall Precaution/Restrictions Comments: trach 28% 6L, cortrak (paused during session and  restarted upon PT exit), flexiseal Restrictions Weight Bearing Restrictions Per Provider Order: No Other Position/Activity Restrictions: Lt humerus non-union, splinted. assuming LUE NWB.     Mobility  Bed Mobility Overal bed mobility: Needs Assistance             General bed mobility comments: bedlevel exercise only, placed in chair position in bed with HOB elevated to 51 deg    Transfers                        Ambulation/Gait                   Stairs             Wheelchair Mobility     Tilt Bed    Modified Rankin (Stroke Patients Only)       Balance Overall balance assessment: Needs assistance Sitting-balance support: Bilateral upper extremity supported, Feet supported Sitting balance-Leahy Scale: Zero Sitting balance - Comments: R lateral lean in chair position Postural control: Right lateral lean                                  Communication Communication Factors Affecting Communication: Trach/intubated  Cognition Arousal: Alert Behavior During Therapy: Flat affect   PT - Cognitive impairments: Difficult to assess Difficult to assess due to: Impaired communication, Tracheostomy, Level of arousal                     PT - Cognition Comments: pt visually tracking, moreso to L than R. following  commands on all extremities except LUE this date. nods yes/no with increased time Following commands: Impaired Following commands impaired: Follows one step commands inconsistently    Cueing Cueing Techniques: Tactile cues, Visual cues, Verbal cues  Exercises General Exercises - Lower Extremity Ankle Circles/Pumps: AAROM, Both, 15 reps, Supine Quad Sets: AAROM, Left, PROM, Right, 15 reps, Supine Heel Slides: AAROM, Both, 15 reps, Supine Shoulder Exercises Elbow Flexion: AAROM, Right, 5 reps, Supine Elbow Extension: AAROM, Right, 5 reps, Supine Wrist Flexion: AAROM, Right, 5 reps, Supine Wrist Extension: AAROM,  Right, 5 reps, Supine    General Comments General comments (skin integrity, edema, etc.): SBP 118 with HOB at 30 deg, SBP 97 with HOB 51 deg, SBP return to 117 with recline to 30 deg      Pertinent Vitals/Pain Pain Assessment Pain Assessment: Faces Faces Pain Scale: Hurts little more Pain Location: motions to abdomen Pain Descriptors / Indicators: Grimacing, Other (Comment) (nods "yes" to nauseous) Pain Intervention(s): Limited activity within patient's tolerance, Monitored during session, Repositioned    Home Living                          Prior Function            PT Goals (current goals can now be found in the care plan section) Acute Rehab PT Goals Patient Stated Goal: Pt unable PT Goal Formulation: Patient unable to participate in goal setting Time For Goal Achievement: 08/26/23 Potential to Achieve Goals: Fair Progress towards PT goals: Progressing toward goals    Frequency    Min 1X/week      PT Plan      Co-evaluation              AM-PAC PT "6 Clicks" Mobility   Outcome Measure  Help needed turning from your back to your side while in a flat bed without using bedrails?: Total Help needed moving from lying on your back to sitting on the side of a flat bed without using bedrails?: Total Help needed moving to and from a bed to a chair (including a wheelchair)?: Total Help needed standing up from a chair using your arms (e.g., wheelchair or bedside chair)?: Total Help needed to walk in hospital room?: Total Help needed climbing 3-5 steps with a railing? : Total 6 Click Score: 6    End of Session Equipment Utilized During Treatment: Oxygen Activity Tolerance: Patient limited by lethargy Patient left: in bed;with call bell/phone within reach;with bed alarm set Nurse Communication: Mobility status PT Visit Diagnosis: Other abnormalities of gait and mobility (R26.89);Muscle weakness (generalized) (M62.81);Difficulty in walking, not elsewhere  classified (R26.2)     Time: 1610-9604 PT Time Calculation (min) (ACUTE ONLY): 21 min  Charges:    $Therapeutic Exercise: 8-22 mins PT General Charges $$ ACUTE PT VISIT: 1 Visit                    Shirlene Doughty, PT DPT Acute Rehabilitation Services Secure Chat Preferred  Office [phone]    Ellington Cornia Cydney Draft 08/12/2023, 3:14 PM

## 2023-08-12 NOTE — Progress Notes (Signed)
 Frank [name] is now on [address].  He has moved I think on Sunday.  He seems to be a more alert.  He does seem to try to answer some simple questions that I asked him.  He is still getting dialysis.  He is still on tube feeds.  His labs show sodium 132.  Potassium 4.2.  BUN 117 creatinine 2.64.  Calcium  8.7 with an albumin  of 2.4.  His white cell count is 8.3.  Hemoglobin 10.9.  Platelet count 259,000.  I think he is due for his next set dose of chemotherapy today.  He gets a Velcade  and Cytoxan .  From the notes, patient and family still would like full measures taken.  He is not that old.  I am not sure why he has all the neurological issues with respect to his cognition.  Morning last checked his monoclonal studies, his monoclonal spike was down to 2.8 g/dL.  The IgG level was 4543 mg/dL.  I think the problem is that the light chains were still on the higher side.  Overall, I think he is tolerant of the chemotherapy.   I know that he has had the temperature spikes.  Again, with all the myeloma that we have seen, we have not had a patient who has had temperatures just because of myeloma.  I typically have had temperature because of underlying infection.  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  I know he is on voriconazole  and acyclovir .  Again, I think we can go ahead with treatment today.  I really do think that he is making slow but steady progress.  I know it is taken quite a bit in order to get him to where he is now.  Hopefully, he will continue to make some progress.   [name], MD  John 15:13

## 2023-08-12 NOTE — Progress Notes (Signed)
 Pt returned to unit from Dialysis via bed. Report was received from Iva, Dialysis RN. Pt is alert and nonverbal. No c/o pain or discomfort, but facial grimacing when attempting to turn pt and adjust him in bed. Bed is in lowest position with wheels locked and alarm set. Call bell is within reach.

## 2023-08-12 NOTE — Progress Notes (Signed)
 Ok to treat with Velcade  and Cytoxan  today with SCr 2.64.  Jobe Mulder [place], [place], BCPS, BCOP 08/12/2023 7:43 AM

## 2023-08-12 NOTE — Progress Notes (Addendum)
 Nutrition Follow-up  DOCUMENTATION CODES:  Severe malnutrition in context of acute illness/injury  INTERVENTION:  Continue tube feeding via cortrak: Vital 1.5 at 65 ml/h (1560 ml per day) Prosource TF20 60 ml BID Provides 2500 kcal, 145 gm protein, 1192 ml free water  daily Continue Juven 1 packet BID via tube, each packet provides 95 calories, 2.5 grams of protein (collagen) + micronutrients to support wound healing Continue Renal MVI daily via tube Monitor for PEG placement and ability to adjust to bolus feeds Nutrasource fiber BID  NUTRITION DIAGNOSIS:  Severe Malnutrition related to acute illness (prolonged illness, interuptions in feeding) as evidenced by moderate fat depletion, moderate muscle depletion, percent weight loss (9.6% x 1 month). - remains applicable  GOAL:  Patient will meet greater than or equal to 90% of their needs - met with TF  MONITOR:  Vent status, Labs, Weight trends, TF tolerance  REASON FOR ASSESSMENT:  Consult Assessment of nutrition requirement/status  ASSESSMENT:  58 y.o. male with PMH of obesity, asthma, OSA on CPAP, GERD, HTN, HLD who presented due to feeling weak with AMS for a few days. Admitted with concern for sepsis, community-acquired pneumonia, encephalopathy as well as concern for multiple myeloma.  4/17 Admit to [facility] 4/21 SLP eval - NPO; IR attempted Cortrak but unable to be placed; CCM MD successfully placed NGT 4/22 Concern for aspiration overnight; Intubated 4/23 - CRRT 5/8 - tracheostomy 5/10 - CRRT discontinued 5/11 - transferred to [facility] 5/12 - iHD initiated   Pt resting in bed at the time of assessment. Awake and makes eye contact, but does not attempt to communicate. No family at bedside. Pt has had his small bore tube in place for >30 days. Discussed with MD who states that consult for PEG by IR was placed 5/26. No CT or evaluation pending for placement yet.   Pt still has flexiseal in place and liquid stool  but output amount has decreased. Will adjust to bolus feeds once PEG is in place to give GI tract periods of rest.  Admit weight: 110.6 kg (4/17) Current weight: 97.2 kg (5/26)  Intake/Output Summary (Last 24 hours) at 08/12/2023 0834 Last data filed at 08/11/2023 2110 Gross per 24 hour  Intake 2109.08 ml  Output 4370 ml  Net -2260.92 ml  Net IO Since Admission: 7,581.94 mL [08/12/23 0834] UOP 1300 mL x 24 hours  Drains/Lines: Small bore tube, 10 Fr. FMS 570 mL x 24 hours Tracheostomy  Tunneled HD catheter, right IJ, double lumen  Nutrition Related Medications:  Scheduled Meds:  bortezomib  SQ  1.3 mg/m2 (Treatment Plan Recorded) Subcutaneous Once   cyclophosphamide   900 mg Intravenous Once   PROSource TF20  60 mL Per Tube BID   folic acid   1 mg Per Tube Daily   insulin  aspart  0-20 Units Subcutaneous Q4H   insulin  aspart  4 Units Subcutaneous Q4H   insulin  glargine-yfgn  8 Units Subcutaneous BID   multivitamin  1 tablet Per Tube QHS   JUVEN  1 packet Per Tube BID BM   palonosetron   0.25 mg Intravenous Once   thiamine   100 mg Per Tube Daily   Continuous Infusions:  feeding supplement (VITAL 1.5 CAL) 1,000 mL (08/12/23 0147)   PRN Meds: ondansetron    Labs: Sodium 139, Potassium 4.3, Phosphorus 3.9,  CBG: 150-228 mg/dL x 24 hrs   Micronutrient Labs: CRP 1.5 (H) Vitamin A 42.5 (WNL) Zinc  73 (WNL) Folate 17.8 (WNL)  Diet Order:   Diet Order  Diet NPO time specified  Diet effective now                  EDUCATION NEEDS:  Not appropriate for education at this time  Skin: Skin Integrity Issues Unstageable: - sacrum (mid buttocks, 14 x 10 cm)  - Right buttock (8 x 6 x 0.1 cm) - Left buttock - Right hip - left thigh Stage 3: - Left posterior thigh:   Last BM:  5/20 FMS placed on 4/20  Height:  Ht Readings from Last 1 Encounters:  07/22/23 6' 0.01" (1.829 m)   Weight:  Wt Readings from Last 1 Encounters:  08/11/23 97.2 kg   Ideal Body  Weight:  80.9 kg  BMI:  Body mass index is 29.06 kg/m.  Estimated Nutritional Needs:  Kcal:  2400-2700 kcal/d Protein:  130-150g/d Fluid:  1L+UOP   Edwena Graham, RD, LDN Registered Dietitian II Please reach out via secure chat

## 2023-08-12 NOTE — Progress Notes (Signed)
 Palliative:  ***  Yong Channel, NP Palliative Medicine Team Pager [phone] (Please see [email or web address] for schedule) Team Phone [phone]

## 2023-08-12 NOTE — Consult Note (Signed)
 WOC Nurse wound follow up Wound type: Pressure injuries stage 3 on Sacrum/buttock. Left posterior thigh and right hip healed. Measurement - 8 cm x 6 cm x 0.1 cm Wound bed: Sacrum - 90% yellow, 10% red.  Drainage (amount, consistency, odor) Moderate amount, serous, no odor. Periwound: partial macerated 3 to 9 o'clock position. Dressing procedure/placement/frequency: Apply Santyl to the wound bed, change daily. Cover with foam dressing, change PRN or every 3 days.  WOC team will follow weekly.   Please reconsult if further assistance is needed. Thank-you,  Rachel Budds BSN, RN, [employer], WOC  (Pager: [phone])

## 2023-08-12 NOTE — Care Management Important Message (Signed)
 Important Message  Patient Details  Name: Frank [name]. MRN: [id number] Date of Birth: [birth year]   Important Message Given:  Yes - Medicare IM     Felix Host 08/12/2023, 3:21 PM

## 2023-08-12 NOTE — Plan of Care (Signed)
  Problem: Clinical Measurements: Goal: Ability to maintain clinical measurements within normal limits will improve Outcome: Progressing Goal: Will remain free from infection Outcome: Progressing Goal: Diagnostic test results will improve Outcome: Progressing Goal: Respiratory complications will improve Outcome: Progressing   Problem: Nutrition: Goal: Adequate nutrition will be maintained Outcome: Progressing   Problem: Coping: Goal: Level of anxiety will decrease Outcome: Progressing   Problem: Elimination: Goal: Will not experience complications related to bowel motility Outcome: Progressing Goal: Will not experience complications related to urinary retention Outcome: Progressing   Problem: Pain Managment: Goal: General experience of comfort will improve and/or be controlled Outcome: Progressing

## 2023-08-12 NOTE — Progress Notes (Signed)
 [facility] Progress Note   Assessment/ Plan:   # Oliguric AKI on CKD 3a - b/l creat 1.1- 1.5 from 2024. Creat here 5.8 on presentation. AKI due to myeloma kidney +/- hyperCa +/- ACEi. CRRT from 4/23--- 5/10.  on iHD now.   dialysis 5/22 following placement  tunneled HD catheter, also on 5/24-  - HD  5/26 d/t catabolic state - next HD Wednesday.  Hoping to stay on MWF schedule to optimize chemo  # AMS/acute metabolic encephalopathy: MRI, LP completed and unrevealing.  Waxes and wanes    #Volume -   Gentle UF moving forward   # Hypotension: normotensive,    # Multiple myeloma: started on Velcade  and Cytoxan ,  Per onc   #AHRF/ aspergillus PNA/ s/p trach 5/8: on voriconazole  IV, off all other IV antibiotics. Off the ventilator on trach collar, per CCM   # Atrial fib-  on amio   #Anemia - prn transfusions. On epogen  per onc.    # Persistent fevers with negative infectious workup thought to be related to malignancy  Subjective:    S/p HD yesterday, got velcade / cytoxan  today.  No issues   Objective:   BP 117/89 (BP Location: Right Arm)   Pulse (!) 106   Temp (!) 100.4 F (38 C) (Oral)   Resp (!) 26   Ht 6' 0.01" (1.829 m)   Wt 96.5 kg   SpO2 97%   BMI 28.85 kg/m   Intake/Output Summary (Last 24 hours) at 08/12/2023 1330 Last data filed at 08/12/2023 1252 Gross per 24 hour  Intake 3897.63 ml  Output 3770 ml  Net 127.63 ml   Weight change:   Physical Exam: Gen: NAD CVS: RRR Resp: on TC Abd: soft Ext: no LE edema ACCESS: TDC  Imaging: No results found.   Labs: BMET Recent Labs  Lab 08/06/23 0206 08/07/23 0256 08/08/23 0450 08/09/23 0719 08/10/23 0711 08/11/23 1059 08/12/23 0525  NA 139 136 132*  131* 137 134* 137  137 132*  K 4.3 4.6 3.4*  3.4* 4.7 4.4 4.8  4.9 4.2  CL 99 101 96*  96* 100 96* 99  99 97*  CO2 23 22 25  25  21* 24 21*  20* 23  GLUCOSE 166* 199* 158*  158* 211* 201* 213*  218* 185*  BUN 137* 172* 101*  103* 165*  121* 166*  170* 117*  CREATININE 4.55* 4.90* 2.83*  2.86* 4.10* 3.28* 3.60*  3.61* 2.64*  CALCIUM  8.2* 7.9* 7.7*  7.6* 7.6* 7.9* 8.0*  8.2* 8.7*  PHOS 3.9 4.5 RESULTS UNAVAILABLE DUE TO INTERFERING SUBSTANCE 4.0 NOT CALCULATED 6.8*  --    CBC Recent Labs  Lab 08/09/23 0719 08/10/23 0711 08/11/23 1059 08/12/23 0525  WBC 8.7 9.1 6.8 8.3  NEUTROABS 5.7 7.5 5.5 6.9  HGB 10.1* 10.4* 11.5* 10.9*  HCT 33.4* 33.2* 37.9* 34.3*  MCV 104.4* 100.0 102.4* 99.4  PLT 284 299 287 259    Medications:     acyclovir  200 mg Per Tube BID   arformoterol   15 mcg Nebulization BID   collagenase   Topical Daily   feeding supplement (PROSource TF20)  60 mL Per Tube BID   folic acid   1 mg Per Tube Daily   heparin  injection (subcutaneous)  5,000 Units Subcutaneous Q8H   insulin  aspart  0-20 Units Subcutaneous Q4H   insulin  aspart  4 Units Subcutaneous Q4H   insulin  glargine-yfgn  8 Units Subcutaneous BID   multivitamin  1 tablet Per  Tube QHS   nicotine   7 mg Transdermal Daily   nutrition supplement (JUVEN)  1 packet Per Tube BID BM   mouth rinse  15 mL Mouth Rinse 4 times per day   PARoxetine   40 mg Per Tube Daily   pneumococcal 20-valent conjugate vaccine  0.5 mL Intramuscular Tomorrow-1000   revefenacin   175 mcg Nebulization Daily   sodium chloride  flush  10-40 mL Intracatheter Q12H   sodium chloride  flush  10-40 mL Intracatheter Q12H   thiamine   100 mg Per Tube Daily   voriconazole   350 mg Per Tube Q12H    Leandra Pro, MD 08/12/2023, 1:30 PM

## 2023-08-12 NOTE — TOC Progression Note (Signed)
 Transition of Care (TOC) - Progression Note    Patient Details  Name: Frank [name]. MRN: [id number] Date of Birth: Aug 10, 1965  [facility]) CM/SW Contact  Jonathan Neighbor, RN Phone Number: 08/12/2023, 1:52 PM  Clinical Narrative:     TOC following for d/c disposition. Pt still with trach/ cortrak/ flexicele and receiving Chemo.   Expected Discharge Plan: Long Term Acute Care (LTAC) Barriers to Discharge: Continued Medical Work up  Expected Discharge Plan and Services In-house Referral: Clinical Social Work Discharge Planning Services: CM Consult Post Acute Care Choice: Skilled Nursing Facility Living arrangements for the past 2 months: Single Family Home                 DME Arranged: N/A DME Agency: NA       HH Arranged: NA HH Agency: NA         Social Determinants of Health (SDOH) Interventions SDOH Screenings   Food Insecurity: No Food Insecurity (07/05/2023)  Housing: Low Risk  (07/05/2023)  Transportation Needs: No Transportation Needs (07/05/2023)  Utilities: Not At Risk (07/05/2023)  Tobacco Use: High Risk (07/10/2023)    Readmission Risk Interventions     No data to display

## 2023-08-13 DIAGNOSIS — Z515 Encounter for palliative care: Secondary | ICD-10-CM | POA: Diagnosis not present

## 2023-08-13 DIAGNOSIS — J9601 Acute respiratory failure with hypoxia: Secondary | ICD-10-CM | POA: Diagnosis not present

## 2023-08-13 DIAGNOSIS — N179 Acute kidney failure, unspecified: Secondary | ICD-10-CM | POA: Diagnosis not present

## 2023-08-13 DIAGNOSIS — R531 Weakness: Secondary | ICD-10-CM | POA: Diagnosis not present

## 2023-08-13 DIAGNOSIS — C9000 Multiple myeloma not having achieved remission: Secondary | ICD-10-CM | POA: Diagnosis not present

## 2023-08-13 DIAGNOSIS — G9341 Metabolic encephalopathy: Secondary | ICD-10-CM | POA: Diagnosis not present

## 2023-08-13 DIAGNOSIS — Z7189 Other specified counseling: Secondary | ICD-10-CM | POA: Diagnosis not present

## 2023-08-13 LAB — CBC WITH DIFFERENTIAL/PLATELET
Abs Immature Granulocytes: 0.1 10*3/uL — ABNORMAL HIGH (ref 0.00–0.07)
Basophils Absolute: 0 10*3/uL (ref 0.0–0.1)
Basophils Relative: 0 %
Eosinophils Absolute: 0 10*3/uL (ref 0.0–0.5)
Eosinophils Relative: 0 %
HCT: 31.1 % — ABNORMAL LOW (ref 39.0–52.0)
Hemoglobin: 9.7 g/dL — ABNORMAL LOW (ref 13.0–17.0)
Lymphocytes Relative: 15 %
Lymphs Abs: 1.3 10*3/uL (ref 0.7–4.0)
MCH: 31.6 pg (ref 26.0–34.0)
MCHC: 31.2 g/dL (ref 30.0–36.0)
MCV: 101.3 fL — ABNORMAL HIGH (ref 80.0–100.0)
Metamyelocytes Relative: 1 %
Monocytes Absolute: 0.3 10*3/uL (ref 0.1–1.0)
Monocytes Relative: 3 %
Neutro Abs: 7.1 10*3/uL (ref 1.7–7.7)
Neutrophils Relative %: 81 %
Platelets: 252 10*3/uL (ref 150–400)
RBC: 3.07 MIL/uL — ABNORMAL LOW (ref 4.22–5.81)
RDW: 21.7 % — ABNORMAL HIGH (ref 11.5–15.5)
WBC: 8.8 10*3/uL (ref 4.0–10.5)
nRBC: 0 /100{WBCs}
nRBC: 0.2 % (ref 0.0–0.2)

## 2023-08-13 LAB — RENAL FUNCTION PANEL
Albumin: 2.2 g/dL — ABNORMAL LOW (ref 3.5–5.0)
Anion gap: 11 (ref 5–15)
BUN: 170 mg/dL — ABNORMAL HIGH (ref 6–20)
CO2: 20 mmol/L — ABNORMAL LOW (ref 22–32)
Calcium: 7.9 mg/dL — ABNORMAL LOW (ref 8.9–10.3)
Chloride: 100 mmol/L (ref 98–111)
Creatinine, Ser: 3.5 mg/dL — ABNORMAL HIGH (ref 0.61–1.24)
GFR, Estimated: 19 mL/min — ABNORMAL LOW (ref >60)
Glucose, Bld: 169 mg/dL — ABNORMAL HIGH (ref 70–99)
Potassium: 5.2 mmol/L — ABNORMAL HIGH (ref 3.5–5.1)
Sodium: 131 mmol/L — ABNORMAL LOW (ref 135–145)

## 2023-08-13 LAB — GLUCOSE, CAPILLARY
Glucose-Capillary: 173 mg/dL — ABNORMAL HIGH (ref 70–99)
Glucose-Capillary: 186 mg/dL — ABNORMAL HIGH (ref 70–99)
Glucose-Capillary: 200 mg/dL — ABNORMAL HIGH (ref 70–99)
Glucose-Capillary: 221 mg/dL — ABNORMAL HIGH (ref 70–99)
Glucose-Capillary: 246 mg/dL — ABNORMAL HIGH (ref 70–99)
Glucose-Capillary: 288 mg/dL — ABNORMAL HIGH (ref 70–99)

## 2023-08-13 LAB — PROTIME-INR
INR: 1 (ref 0.8–1.2)
Prothrombin Time: 13.8 s (ref 11.4–15.2)

## 2023-08-13 LAB — VORICONAZOLE, SERUM: Voriconazole, Serum: 0.4 ug/mL

## 2023-08-13 MED ORDER — INSULIN GLARGINE-YFGN 100 UNIT/ML ~~LOC~~ SOLN
10.0000 [IU] | Freq: Two times a day (BID) | SUBCUTANEOUS | Status: DC
  Administered 2023-08-13 – 2023-09-05 (×40): 10 [IU] via SUBCUTANEOUS
  Filled 2023-08-13 (×48): qty 0.1

## 2023-08-13 MED ORDER — IOHEXOL 300 MG/ML  SOLN: 80.0000 mL | Freq: Once | INTRAMUSCULAR | Status: DC | PRN

## 2023-08-13 MED ORDER — HEPARIN SODIUM (PORCINE) 1000 UNIT/ML IJ SOLN
INTRAMUSCULAR | Status: AC
  Filled 2023-08-13: qty 1

## 2023-08-13 MED ORDER — VITAL 1.5 CAL PO LIQD
1000.0000 mL | ORAL | Status: DC
  Administered 2023-08-13: 1000 mL
  Filled 2023-08-13 (×3): qty 1000

## 2023-08-13 MED ORDER — HEPARIN SODIUM (PORCINE) 1000 UNIT/ML IJ SOLN
3800.0000 [IU] | Freq: Once | INTRAMUSCULAR | Status: AC
  Administered 2023-08-13: 3800 [IU]

## 2023-08-13 MED ORDER — CEFAZOLIN SODIUM-DEXTROSE 2-4 GM/100ML-% IV SOLN
2.0000 g | INTRAVENOUS | Status: AC
  Administered 2023-08-14: 2 g via INTRAVENOUS
  Filled 2023-08-13: qty 100

## 2023-08-13 MED ORDER — HEPARIN SODIUM (PORCINE) 5000 UNIT/ML IJ SOLN
5000.0000 [IU] | Freq: Three times a day (TID) | INTRAMUSCULAR | Status: DC
  Administered 2023-08-14 – 2023-09-18 (×97): 5000 [IU] via SUBCUTANEOUS
  Filled 2023-08-13 (×102): qty 1

## 2023-08-13 NOTE — Procedures (Signed)
 Patient machine alarming, attempted to flush lines but no success, dialyzer clotting off, patient has 45 minutes left. MD notified, said to change cartridge and attempt to finish treatment. Unable to return all blood, MD notified.

## 2023-08-13 NOTE — Progress Notes (Signed)
 Pharmacy Antibiotic Note  Frank [name]. is a 58 y.o. male admitted on 07/03/2023 with generalized weakness (multifactorial secondary to hypercalcemia, suspected malignancy, multiple myeloma).  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   A voriconazole  trough was drawn last Friday resulting in a subtherapeutic level of 0.4. Notably patient was previously therapeutic on current dosing scheme.   Patient is on continuous tube feeds which may be decreasing absorption of his voriconazole . Noted plan for G tube placement soon. Discussed with RD and she will make sure tube feeds are held at least 1 hour before and after voriconazole  administration>>Also plan to switch to bolus feeding once G tube in place.   Plan: - Continue voriconazole  350 mg BID per tube - Will continue to monitor voriconazole  - Will re-check level ~  6/3 to allow time for effect to be seen from tube feed adjustment   Height: 6' 0.01" (182.9 cm) Weight: 96.9 kg (213 lb 10 oz) IBW/kg (Calculated) : 77.62  Temp (24hrs), Avg:99.1 F (37.3 C), Min:98.1 F (36.7 C), Max:99.7 F (37.6 C)  Recent Labs  Lab 08/09/23 0719 08/10/23 0711 08/11/23 1059 08/12/23 0525 08/13/23 0531  WBC 8.7 9.1 6.8 8.3 8.8  CREATININE 4.10* 3.28* 3.60*  3.61* 2.64* 3.50*    Estimated Creatinine Clearance: 27.4 mL/min (A) (by C-G formula based on SCr of 3.5 mg/dL (H)).    Allergies  Allergen Reactions   Morphine And Codeine Anaphylaxis   Shellfish Allergy Anaphylaxis   Betadine  [Povidone Iodine ] Itching   Bupropion Other (See Comments)    Shaking of the body, hallucinations    Chlorhexidine  Hives    Patient reports never had issues CHG with mouth rinse.   Influenza Vaccines Hives   Metoprolol  Rash     Thank you for allowing pharmacy to be a part of this patient's care.  Denson Flake, PharmD, BCPS, BCIDP Infectious Diseases Clinical Pharmacist Phone:  [phone] 08/13/2023 1:45 PM   **Pharmacist phone directory can now be found on [email or web address] ([email or web address]).  Listed under [facility].

## 2023-08-13 NOTE — Progress Notes (Signed)
 [facility] Progress Note   Assessment/ Plan:   # Oliguric AKI on CKD 3a - b/l creat 1.1- 1.5 from 2024. Creat here 5.8 on presentation. AKI due to myeloma kidney +/- hyperCa +/- ACEi. CRRT from 4/23--- 5/10.  on iHD now.   dialysis 5/22 following placement  tunneled HD catheter, also on 5/24-  - HD  5/26 d/t catabolic state - next HD Wednesday.  Hoping to stay on MWF schedule to optimize chemo schedule - increased time to 3.75 hrs d/t sig azotemia  # AMS/acute metabolic encephalopathy: MRI, LP completed and unrevealing.  Waxes and wanes    #Volume -   Gentle UF moving forward   # Hypotension: normotensive,    # Multiple myeloma: started on Velcade  and Cytoxan ,  Per onc   #AHRF/ aspergillus PNA/ s/p trach 5/8: on voriconazole  IV, off all other IV antibiotics. Off the ventilator on trach collar, per CCM   # Atrial fib-  on amio   #Anemia - prn transfusions. On epogen  per onc.    # Persistent fevers with negative infectious workup thought to be related to malignancy  Subjective:    S/p HD yesterday, got velcade / cytoxan  today.  No issues   Objective:   BP (!) 135/96   Pulse (!) 119   Temp 99.7 F (37.6 C)   Resp 18   Ht 6' 0.01" (1.829 m)   Wt 96.9 kg   SpO2 98%   BMI 28.97 kg/m   Intake/Output Summary (Last 24 hours) at 08/13/2023 1207 Last data filed at 08/13/2023 1100 Gross per 24 hour  Intake 3337.22 ml  Output 900 ml  Net 2437.22 ml   Weight change:   Physical Exam: Gen: NAD CVS: RRR Resp: on TC Abd: soft Ext: no LE edema ACCESS: TDC  Imaging: No results found.   Labs: BMET Recent Labs  Lab 08/07/23 0256 08/08/23 0450 08/09/23 0719 08/10/23 0711 08/11/23 1059 08/12/23 0525 08/13/23 0531  NA 136 132*  131* 137 134* 137  137 132* 131*  K 4.6 3.4*  3.4* 4.7 4.4 4.8  4.9 4.2 5.2*  CL 101 96*  96* 100 96* 99  99 97* 100  CO2 22 25  25  21* 24 21*  20* 23 20*  GLUCOSE 199* 158*  158* 211* 201* 213*  218* 185* 169*  BUN  172* 101*  103* 165* 121* 166*  170* 117* 170*  CREATININE 4.90* 2.83*  2.86* 4.10* 3.28* 3.60*  3.61* 2.64* 3.50*  CALCIUM  7.9* 7.7*  7.6* 7.6* 7.9* 8.0*  8.2* 8.7* 7.9*  PHOS 4.5 RESULTS UNAVAILABLE DUE TO INTERFERING SUBSTANCE 4.0 NOT CALCULATED 6.8*  --  RESULTS UNAVAILABLE DUE TO INTERFERING SUBSTANCE   CBC Recent Labs  Lab 08/10/23 0711 08/11/23 1059 08/12/23 0525 08/13/23 0531  WBC 9.1 6.8 8.3 8.8  NEUTROABS 7.5 5.5 6.9 7.1  HGB 10.4* 11.5* 10.9* 9.7*  HCT 33.2* 37.9* 34.3* 31.1*  MCV 100.0 102.4* 99.4 101.3*  PLT 299 287 259 252    Medications:     acyclovir  200 mg Per Tube BID   arformoterol   15 mcg Nebulization BID   collagenase   Topical Daily   feeding supplement (PROSource TF20)  60 mL Per Tube BID   fiber  1 packet Per Tube BID   folic acid   1 mg Per Tube Daily   heparin  injection (subcutaneous)  5,000 Units Subcutaneous Q8H   heparin  sodium (porcine)  3,800 Units Intracatheter Once   insulin  aspart  0-20  Units Subcutaneous Q4H   insulin  aspart  4 Units Subcutaneous Q4H   insulin  glargine-yfgn  8 Units Subcutaneous BID   multivitamin  1 tablet Per Tube QHS   nicotine   7 mg Transdermal Daily   nutrition supplement (JUVEN)  1 packet Per Tube BID BM   mouth rinse  15 mL Mouth Rinse 4 times per day   PARoxetine   40 mg Per Tube Daily   pneumococcal 20-valent conjugate vaccine  0.5 mL Intramuscular Tomorrow-1000   revefenacin   175 mcg Nebulization Daily   sodium chloride  flush  10-40 mL Intracatheter Q12H   sodium chloride  flush  10-40 mL Intracatheter Q12H   thiamine   100 mg Per Tube Daily   voriconazole   350 mg Per Tube Q12H    Leandra Pro, MD 08/13/2023, 12:07 PM

## 2023-08-13 NOTE — Procedures (Signed)
 Received patient in bed to unit.  Alert. Informed consent signed and in chart.   TX duration: 3.75 hours  Patient tolerated well.  Transported back to the room  Alert, without acute distress.  Hand-off given to patient's nurse.   Access used: right cath Access issues: none  Total UF removed: 2.5 liters  Medication(s) given: heparin   Clover Dao, RN [facility]

## 2023-08-13 NOTE — Procedures (Signed)
 Patient seen and examined on Hemodialysis. The procedure was supervised and I have made appropriate changes. BP (!) 135/96   Pulse (!) 119   Temp 99.7 F (37.6 C)   Resp 18   Ht 6' 0.01" (1.829 m)   Wt 96.9 kg   SpO2 98%   BMI 28.97 kg/m   QB 400 mL/ min, UF goal 2L  Tolerating treatment without complaints at this time.   Leandra Pro MD [facility] [phone] 12:11 PM

## 2023-08-13 NOTE — Consult Note (Signed)
 Chief Complaint: Multiple myeloma, Encephalopathy with tracheostomy, malnutrition - IR consulted for gastrostomy tube placement  Referring Provider(s): Vada Garibaldi, MD   Supervising Physician: Marland Silvas  Patient Status: [facility] [place] - In-pt  History of Present Illness: Frank [name]. is a 58 y.o. male with recently diagnosed multiple myeloma in April 2025. Initially had presented to [facility] with generalized weakness and AMS. Pt was found to have AKI and multiple lytic lesions concerning for multiple myeloma, with this later being confirmed with biopsy. Pt was since transferred to [facility] on 07/03/23 and has been in the hospital with AMS, intubated and later with tracheostomy placement. Has had fluctuating fevers that oncology and medicine team have attributed to underlying myeloma. Is known to IR service from tunneled HD cath placed on 08/07/23. Due to persistent malnutrition, IR now consulted again for gastrostomy tube placement.   Case reviewed with Dr. Marlena Sima who approved anatomy. Pt receiving 4hr dialysis treatment today and currently on continuous NG tube feeds. Will plan tentatively on gastrostomy tube placement being tomorrow, 5/29. Awaiting PT/INR. Tube feeds to be held at midnight. 0600 and 1400 heparin  doses to be held tomorrow. Abx ordered to be given tomorrow for surgical prophylaxis.   Patient is Full Code  Past Medical History:  Diagnosis Date   Anxiety    Arthritis    Asthma    Bipolar disorder (HCC)    Current every day smoker    Depression    Diabetes mellitus, type II (HCC)    Dyspnea    History of kidney stones    History of pneumonia    Hyperlipidemia    Hypertension    Morbid obesity (HCC)    Sedentary lifestyle    Seizures (HCC)    last seizure 12 yrs ago, no current problem   Sleep apnea    uses CPAP nightly    Past Surgical History:  Procedure Laterality Date   CARDIAC CATHETERIZATION  2020   carpal tunel Left    COLONOSCOPY      HARDWARE REMOVAL Left 07/26/2022   Procedure: HARDWARE REMOVAL ELBOW;  Surgeon: Laneta Pintos, MD;  Location: [facility] OR;  Service: Orthopedics;  Laterality: Left;   IR FLUORO GUIDE CV LINE RIGHT  08/07/2023   IR US  GUIDE VASC ACCESS RIGHT  08/07/2023   ORIF HUMERUS FRACTURE Left 01/16/2022   Procedure: OPEN REDUCTION INTERNAL FIXATION (ORIF) DISTAL HUMERUS FRACTURE;  Surgeon: Laneta Pintos, MD;  Location: [facility] OR;  Service: Orthopedics;  Laterality: Left;   ORIF HUMERUS FRACTURE Left 01/29/2023   Procedure: OPEN REDUCTION INTERNAL FIXATION (ORIF) DISTAL HUMERUS FRACTURE;  Surgeon: Laneta Pintos, MD;  Location: [facility] OR;  Service: Orthopedics;  Laterality: Left;   OTHER SURGICAL HISTORY     R & L shoulder   OTHER SURGICAL HISTORY Right    Knee surgery x several   TOTAL HIP ARTHROPLASTY Left 12/26/2017   Procedure: LEFT TOTAL HIP ARTHROPLASTY ANTERIOR APPROACH;  Surgeon: Arnie Lao, MD;  Location: [facility] ORS;  Service: Orthopedics;  Laterality: Left;   TOTAL KNEE ARTHROPLASTY Left 11/23/2021   Procedure: LEFT TOTAL KNEE ARTHROPLASTY;  Surgeon: Arnie Lao, MD;  Location: [facility] ORS;  Service: Orthopedics;  Laterality: Left;    Allergies: Morphine and codeine, Shellfish allergy, Betadine  [povidone iodine ], Bupropion, Chlorhexidine , Influenza vaccines, and Metoprolol   Medications: Prior to Admission medications   Medication Sig Start Date End Date Taking? Authorizing Provider  albuterol  (PROVENTIL  HFA;VENTOLIN  HFA) 108 (90 Base) MCG/ACT inhaler Inhale 2  puffs into the lungs every 6 (six) hours as needed for wheezing or shortness of breath.   Yes [provider]  albuterol  (PROVENTIL ) (2.5 MG/3ML) 0.083% nebulizer solution 2.5 mg every 2 (two) hours as needed for wheezing or shortness of breath.   Yes [provider]  aspirin  EC 81 MG tablet Take 81 mg by mouth daily. Swallow whole.   Yes [provider]  busPIRone  (BUSPAR ) 15 MG tablet Take 15 mg by mouth  at bedtime.   Yes [provider]  celecoxib  (CELEBREX ) 200 MG capsule Take 200 mg by mouth daily.   Yes [provider]  clonazePAM  (KLONOPIN ) 1 MG tablet Take 1 mg by mouth once as needed for anxiety.   Yes [provider]  cyclobenzaprine  (FLEXERIL ) 5 MG tablet Take 1 tablet (5 mg total) by mouth 3 (three) times daily as needed for muscle spasms. Patient taking differently: Take 5 mg by mouth daily as needed for muscle spasms. 01/30/23  Yes Versie Gores, PA-C  divalproex  (DEPAKOTE ) 500 MG DR tablet Take 500 mg by mouth 2 (two) times daily.   Yes [provider]  EPINEPHrine  0.3 mg/0.3 mL IJ SOAJ injection Inject 0.3 mg into the muscle as needed for anaphylaxis. 08/28/21  Yes [provider]  esomeprazole (NEXIUM) 40 MG capsule Take 40 mg by mouth in the morning.   Yes [provider]  Fluticasone -Salmeterol (ADVAIR) 500-50 MCG/DOSE AEPB Inhale 2 puffs into the lungs in the morning.   Yes [provider]  furosemide  (LASIX ) 40 MG tablet Take 40 mg by mouth daily as needed for fluid or edema.   Yes [provider]  gabapentin  (NEURONTIN ) 300 MG capsule Take 300-600 mg by mouth See admin instructions. 300 mg in the morning, 600 mg at bedtime   Yes [provider]  hydrALAZINE  (APRESOLINE ) 10 MG tablet Take 10 mg by mouth in the morning and at bedtime.   Yes [provider]  hydrochlorothiazide  (HYDRODIURIL ) 25 MG tablet Take 25 mg by mouth daily.   Yes [provider]  lisinopril  (ZESTRIL ) 40 MG tablet Take 40 mg by mouth daily. 03/25/23  Yes [provider]  metFORMIN  (GLUCOPHAGE ) 1000 MG tablet Take 1,000 mg by mouth 2 (two) times daily with a meal.   Yes [provider]  montelukast  (SINGULAIR ) 10 MG tablet Take 10 mg by mouth at bedtime.   Yes [provider]  OVER THE COUNTER MEDICATION Take 400 mg by mouth at bedtime. Burdock root   Yes [provider]   oxyCODONE  (ROXICODONE ) 5 MG immediate release tablet Take 1 tablet (5 mg total) by mouth every 4 (four) hours as needed for severe pain (pain score 7-10). 01/30/23  Yes Versie Gores, PA-C  PARoxetine  (PAXIL ) 40 MG tablet Take 40 mg by mouth every morning.   Yes [provider]  spironolactone  (ALDACTONE ) 25 MG tablet Take 25 mg by mouth at bedtime. 10/07/21  Yes [provider]  tamsulosin  (FLOMAX ) 0.4 MG CAPS capsule Take 0.4 mg by mouth at bedtime. 10/07/21  Yes [provider]  testosterone  cypionate (DEPOTESTOSTERONE CYPIONATE) 200 MG/ML injection Inject 0.5 mLs (100 mg total) into the muscle every 14 (fourteen) days. 09/17/16  Yes Nida, Gebreselassie W, MD  verapamil  (CALAN -SR) 120 MG CR tablet Take 120 mg by mouth daily.   Yes [provider]  Vitamin D , Ergocalciferol , (DRISDOL ) 1.25 MG (50000 UNIT) CAPS capsule Take 1 capsule (50,000 Units total) by mouth every Thursday. Patient taking  differently: Take 50,000 Units by mouth once a week. Tuesday 02/06/23  Yes Versie Gores, PA-C  buPROPion (WELLBUTRIN XL) 150 MG 24 hr tablet Take 150 mg by mouth daily. Patient not taking: Reported on 07/04/2023    [provider]     Family History  Problem Relation Age of Onset   Diabetes Mother    Hypertension Mother    Heart attack Mother    Osteoporosis Mother    Diabetes Father    Hypertension Father    Heart attack Father    Diabetes Sister    Hypertension Sister    Diabetes Brother    Hypertension Brother     Social History   Socioeconomic History   Marital status: Married    Spouse name: Not on file   Number of children: Not on file   Years of education: Not on file   Highest education level: Not on file  Occupational History   Not on file  Tobacco Use   Smoking status: Every Day    Current packs/day: 0.50    Average packs/day: 0.5 packs/day for 25.0 years (12.5 ttl pk-yrs)    Types: Cigarettes   Smokeless tobacco: Never   Vaping Use   Vaping status: Never Used  Substance and Sexual Activity   Alcohol  use: No   Drug use: Yes    Types: Marijuana    Comment: 01/28/2023   Sexual activity: Yes  Other Topics Concern   Not on file  Social History Narrative   Not on file   Social Drivers of Health   Financial Resource Strain: Not on file  Food Insecurity: No Food Insecurity (07/05/2023)   Hunger Vital Sign    Worried About Running Out of Food in the Last Year: Never true    Ran Out of Food in the Last Year: Never true  Transportation Needs: No Transportation Needs (07/05/2023)   PRAPARE - Administrator, Civil Service (Medical): No    Lack of Transportation (Non-Medical): No  Physical Activity: Not on file  Stress: Not on file  Social Connections: Not on file    Review of Systems  Unable to perform ROS: Acuity of condition    Vital Signs: BP (!) 144/94   Pulse (!) 121   Temp 99.7 F (37.6 C)   Resp (!) 23   Ht 6' 0.01" (1.829 m)   Wt 213 lb 10 oz (96.9 kg)   SpO2 96%   BMI 28.97 kg/m   Advance Care Plan: No documents on file  Physical Exam Vitals and nursing note reviewed.  Cardiovascular:     Rate and Rhythm: Tachycardia present.  Pulmonary:     Comments: Course breath sounds bilaterally Abdominal:     Palpations: Abdomen is soft.  Musculoskeletal:     Right lower leg: No edema.     Left lower leg: No edema.  Skin:    General: Skin is warm and dry.  Neurological:     Comments: Patient seen moving all extremities. Will track and follow me with eyes at times. Further unable to assess neuro status due to acuity of condition - pt with tracheostomy and recovering from encephalopathy.     Imaging: EEG adult Result Date: 08/10/2023 Arleene Lack, MD     08/10/2023  6:07 AM Patient Name: Frank [name]. MRN: [id number] Epilepsy Attending: Arleene Lack Referring Physician/Provider: Vada Garibaldi, MD Date: 08/09/2023 Duration: 24.42 mins Patient history: 58yo M  with ams. EEG to evaluate  for seizure Level of alertness: Awake/ lethargic AEDs during EEG study: None Technical aspects: This EEG study was done with scalp electrodes positioned according to the 10-20 International system of electrode placement. Electrical activity was reviewed with band pass filter of 1-70Hz , sensitivity of 7 uV/mm, display speed of 55mm/sec with a 60Hz  notched filter applied as appropriate. EEG data were recorded continuously and digitally stored.  Video monitoring was available and reviewed as appropriate. Description: EEG showed continuous generalized 5 to 6 Hz theta slowing. Patient was noted to have episodes of brief sudden head jerking every few seconds. Concomitant EEG before, during and after the event did not show any EEG changes suggest seizure.  Hyperventilation and photic stimulation were not performed.   EEG was technically difficult due to significant eye flutter and movement artifact. ABNORMALITY - Continuous slow, generalized IMPRESSION: This technically difficult study is suggestive of moderate diffuse encephalopathy. No seizures or epileptiform discharges were seen throughout the recording. Patient was noted to have episodes of brief sudden head jerking every few seconds without concomitant EEG change. These events were most likely non epileptic. Priyanka O Yadav   IR Fluoro Guide CV Line Right Result Date: 08/07/2023 INDICATION: 58 year old male with history of renal failure requiring central venous access for hemodialysis. EXAM: TUNNELED CENTRAL VENOUS HEMODIALYSIS CATHETER PLACEMENT WITH ULTRASOUND AND FLUOROSCOPIC GUIDANCE MEDICATIONS: Ancef  2 gm IV . The antibiotic was given in an appropriate time interval prior to skin puncture. ANESTHESIA/SEDATION: Moderate (conscious) sedation was employed during this procedure. A total of Versed  1 mg and Fentanyl  25 mcg was administered intravenously. Moderate Sedation Time: 12 minutes. The patient's level of consciousness and vital  signs were monitored continuously by radiology nursing throughout the procedure under my direct supervision. FLUOROSCOPY TIME:  Three mGy reference air kerma COMPLICATIONS: None immediate. PROCEDURE: Informed written consent was obtained from the patient after a discussion of the risks, benefits, and alternatives to treatment. Questions regarding the procedure were encouraged and answered. The right neck and chest were prepped with chlorhexidine  in a sterile fashion, and a sterile drape was applied covering the operative field. Maximum barrier sterile technique with sterile gowns and gloves were used for the procedure. A timeout was performed prior to the initiation of the procedure. After creating a small venotomy incision, a 21 gauge micropuncture kit was utilized to access the internal jugular vein. Real-time ultrasound guidance was utilized for vascular access including the acquisition of a permanent ultrasound image documenting patency of the accessed vessel. A Rosen wire was advanced to the level of the IVC and the micropuncture sheath was exchanged for an 8 Fr dilator. A 14.5 French tunneled hemodialysis catheter measuring 23 cm from tip to cuff was tunneled in a retrograde fashion from the anterior chest wall to the venotomy incision. Serial dilation was then performed an a peel-away sheath was placed. The catheter was then placed through the peel-away sheath with the catheter tip ultimately positioned within the right atrium. Final catheter positioning was confirmed and documented with a spot radiographic image. The catheter aspirates and flushes normally. The catheter was flushed with appropriate volume heparin  dwells. The catheter exit site was secured with a 0-Silk retention suture. The venotomy incision was closed with Dermabond. Sterile dressings were applied. The patient tolerated the procedure well without immediate post procedural complication. IMPRESSION: Successful placement of 23 cm tip to cuff  tunneled hemodialysis catheter via the right internal jugular vein with catheter tip terminating within the right atrium. The catheter is ready for immediate use. Dylan  Jinx Mourning, MD [facility] Radiology Electronically Signed   By: Creasie Doctor M.D.   On: 08/07/2023 16:25   IR US  Guide Vasc Access Right Result Date: 08/07/2023 INDICATION: 58 year old male with history of renal failure requiring central venous access for hemodialysis. EXAM: TUNNELED CENTRAL VENOUS HEMODIALYSIS CATHETER PLACEMENT WITH ULTRASOUND AND FLUOROSCOPIC GUIDANCE MEDICATIONS: Ancef  2 gm IV . The antibiotic was given in an appropriate time interval prior to skin puncture. ANESTHESIA/SEDATION: Moderate (conscious) sedation was employed during this procedure. A total of Versed  1 mg and Fentanyl  25 mcg was administered intravenously. Moderate Sedation Time: 12 minutes. The patient's level of consciousness and vital signs were monitored continuously by radiology nursing throughout the procedure under my direct supervision. FLUOROSCOPY TIME:  Three mGy reference air kerma COMPLICATIONS: None immediate. PROCEDURE: Informed written consent was obtained from the patient after a discussion of the risks, benefits, and alternatives to treatment. Questions regarding the procedure were encouraged and answered. The right neck and chest were prepped with chlorhexidine  in a sterile fashion, and a sterile drape was applied covering the operative field. Maximum barrier sterile technique with sterile gowns and gloves were used for the procedure. A timeout was performed prior to the initiation of the procedure. After creating a small venotomy incision, a 21 gauge micropuncture kit was utilized to access the internal jugular vein. Real-time ultrasound guidance was utilized for vascular access including the acquisition of a permanent ultrasound image documenting patency of the accessed vessel. A Rosen wire was  advanced to the level of the IVC and the micropuncture sheath was exchanged for an 8 Fr dilator. A 14.5 French tunneled hemodialysis catheter measuring 23 cm from tip to cuff was tunneled in a retrograde fashion from the anterior chest wall to the venotomy incision. Serial dilation was then performed an a peel-away sheath was placed. The catheter was then placed through the peel-away sheath with the catheter tip ultimately positioned within the right atrium. Final catheter positioning was confirmed and documented with a spot radiographic image. The catheter aspirates and flushes normally. The catheter was flushed with appropriate volume heparin  dwells. The catheter exit site was secured with a 0-Silk retention suture. The venotomy incision was closed with Dermabond. Sterile dressings were applied. The patient tolerated the procedure well without immediate post procedural complication. IMPRESSION: Successful placement of 23 cm tip to cuff tunneled hemodialysis catheter via the right internal jugular vein with catheter tip terminating within the right atrium. The catheter is ready for immediate use. Creasie Doctor, MD [facility] Radiology Electronically Signed   By: Creasie Doctor M.D.   On: 08/07/2023 16:25   DG Humerus Left Result Date: 08/05/2023 CLINICAL DATA:  Fracture follow-up. EXAM: LEFT HUMERUS - 2+ VIEW COMPARISON:  Elbow radiograph 01/29/2023 FINDINGS: Lateral plate and screw fixation of the distal humerus. Progressive decreased bone density of the distal humerus involving the distal 1/3 with loss of bone stock, as well as also loss of cortical margins medially. Remote distal humeral fracture with nonunion. Soft tissue edema seen laterally. The proximal humerus is not entirely included in the field of view, however diffusely decreased bone density is seen proximally. IMPRESSION: Lateral plate and screw fixation of the distal humerus. Progressive decreased  bone density of the distal humerus with loss of bone stock, as well as cortical margins medially. This is greater than typically seen with disuse and raises suspicion for infection or underlying lytic bone lesion. Clinical correlation is advised. Electronically Signed  By: Chadwick Colonel M.D.   On: 08/05/2023 13:04   DG Chest Port 1 View Result Date: 08/03/2023 CLINICAL DATA:  Status post tracheostomy. EXAM: PORTABLE CHEST 1 VIEW COMPARISON:  Radiograph 07/26/2023 FINDINGS: Tracheostomy tube tip projects over the thoracic inlet. Weighted enteric tube tip below the diaphragm not included in the field of view. Right internal jugular central line tip projects over the brachiocephalic/SVC confluence. Development of subcutaneous emphysema about both supraclavicular regions and lateral chest walls. There is pneumomediastinum. No convincing pneumothorax. Stable heart size and mediastinal contours. Improvement in patchy bilateral lung opacities, greatest residual on the left. IMPRESSION: 1. Tracheostomy tube tip projects over the thoracic inlet. 2. Development of subcutaneous emphysema about both supraclavicular regions and lateral chest walls. Pneumomediastinum. No convincing pneumothorax. 3. Improvement in patchy bilateral lung opacities, greatest residual on the left. Electronically Signed   By: Chadwick Colonel M.D.   On: 08/03/2023 16:33   CT FEMUR RIGHT W CONTRAST Result Date: 08/01/2023 CLINICAL DATA:  Right thigh hematoma EXAM: CT OF THE LOWER RIGHT EXTREMITY WITH CONTRAST TECHNIQUE: Multidetector CT imaging of the lower right extremity was performed according to the standard protocol following intravenous contrast administration. RADIATION DOSE REDUCTION: This exam was performed according to the departmental dose-optimization program which includes automated exposure control, adjustment of the mA and/or kV according to patient size and/or use of iterative reconstruction technique. CONTRAST:  75mL OMNIPAQUE   IOHEXOL  350 MG/ML SOLN COMPARISON:  None Available. FINDINGS: Bones/Joint/Cartilage Degenerative changes seen at the lumbosacral junction. Left total hip arthroplasty partially visualized. 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 No acute fracture or dislocation. Ligaments Suboptimally assessed by CT. Muscles and Tendons There is marked fatty atrophy of the semimembranosus and biceps femoris as well as the rectus femoris. Milder atrophy involving the vastus intermedius and vastus lateralis. Iliopsoas, gluteal, hamstring, quadriceps, patellar tendons appear intact. Soft tissues Ovoid hyperdense collection is seen within the subcutaneous soft tissues of the anterior right thigh measuring 12.1 x 30.2 x 7.6 cm in greatest dimension compatible with a subcutaneous hematoma. No active extravasation identified. The collection communicates superiorly with a second similar appearing collection within the subcutaneous fat of the right gluteal region measuring 8.4 x 10.4 x 22.2 cm in keeping with an additional subcutaneous hematoma. Again, no active extravasation identified within this collection. No free intraperitoneal fluid noted within the visualized pelvis. No extraperitoneal hemorrhage noted within the deep pelvis. IMPRESSION: 1. 12.1 x 30.2 x 7.6 cm subcutaneous hematoma within the anterior right thigh. 2. 8.4 x 10.4 x 22.2 cm subcutaneous hematoma within the right gluteal region. 3. No active extravasation identified. 4. No acute fracture or dislocation. 5. 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 6. 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 7. Marked fatty atrophy of the semimembranosus and biceps femoris as well as the rectus femoris. Milder atrophy involving the vastus intermedius and vastus lateralis. Electronically Signed   By: Worthy Heads M.D.   On: 08/01/2023 23:52   MR BRAIN WO CONTRAST Result Date:  07/26/2023 CLINICAL DATA:  Follow-up examination for aspergillosis, altered mental status, immunosuppressed. EXAM: MRI HEAD WITHOUT CONTRAST TECHNIQUE: Multiplanar, multiecho pulse sequences of the brain and surrounding structures were obtained without intravenous contrast. COMPARISON:  MRI from 07/18/2023. FINDINGS: Brain: Cerebral volume within normal limits for age. Patchy T2/FLAIR hyperintensity involving the supratentorial cerebral white matter, most like related chronic microvascular ischemic disease, stable. No evidence for acute or subacute infarct. No areas of chronic  cortical infarction or other insult. No acute intracranial hemorrhage. Single punctate chronic microhemorrhage noted at the left frontal corona radiata, stable. No mass lesion, midline shift or mass effect. Ventricles stable in size without hydrocephalus. No extra-axial fluid collection. Pituitary gland and suprasellar region within normal limits. No evidence for acute CNS infection by MRI. Vascular: Major intracranial vascular flow voids are stable and maintained. Skull and upper cervical spine: Craniocervical junction within normal limits. Diffusely abnormal appearance of the visualized osseous structures, consistent with Mets or myeloma, relatively stable from prior. No scalp soft tissue abnormality. Sinuses/Orbits: Globes and orbital soft tissues within normal limits. Scattered mucosal thickening present about the sphenoid and maxillary sinuses. Large right with moderate left mastoid effusions. Other: Incidental cystic lesion at the nasal bridge again noted, likely a dermoid/epidermoid, stable. IMPRESSION: 1. Stable brain MRI. No acute intracranial abnormality. 2. Diffusely abnormal appearance of the visualized osseous structures, concerning for metastatic disease or myeloma, stable. 3. Moderate cerebral white matter disease, nonspecific, but most commonly related to chronic microvascular ischemic disease. 4. Large right with moderate left  mastoid effusions, of uncertain significance. Correlation with physical exam recommended. Electronically Signed   By: Virgia Griffins M.D.   On: 07/26/2023 18:25   DG Chest Port 1 View Result Date: 07/26/2023 CLINICAL DATA:  [id number] Increased tracheal secretions [id number] EXAM: PORTABLE CHEST 1 VIEW COMPARISON:  Jul 24, 2023 FINDINGS: The cardiomediastinal silhouette is unchanged in contour.Tracheostomy. The enteric tube courses through the chest to the abdomen with weighted metallic tip projecting over the stomach. RIGHT IJ CVC catheter tip terminates over the SVC. No pleural effusion. No pneumothorax. Perihilar vascular fullness. There is bronchial wall cuffing. Biapical airspace opacities and LEFT retrocardiac airspace opacity. Advanced degenerative changes of the shoulders. IMPRESSION: 1. Bronchial wall thickening with patchy bilateral airspace opacities likely reflect multifocal infection. Differential considerations include pulmonary edema. Electronically Signed   By: Clancy Crimes M.D.   On: 07/26/2023 10:31   CT CHEST WO CONTRAST Result Date: 07/26/2023 CLINICAL DATA:  Hypoxic respiratory failure. Evaluate for changes suggestive of invasive fungal infection. EXAM: CT CHEST WITHOUT CONTRAST TECHNIQUE: Multidetector CT imaging of the chest was performed following the standard protocol without IV contrast. RADIATION DOSE REDUCTION: This exam was performed according to the departmental dose-optimization program which includes automated exposure control, adjustment of the mA and/or kV according to patient size and/or use of iterative reconstruction technique. COMPARISON:  07/20/2023 FINDINGS: Cardiovascular: Heart size is normal. There is no pericardial effusion. Aortic atherosclerosis and coronary artery calcifications. Mediastinum/Nodes: Tracheostomy tube tip is above the carina. Enteric tube tip is in the body of the stomach. No mediastinal or hilar adenopathy. Lungs/Pleura: Decreased volume of  bilateral pleural effusions. Partial atelectasis of bilateral lower lobes identified. Progressive ground-glass and airspace densities within the left upper lobe noted. There also multiple nodular densities with surrounding ground-glass attenuation throughout both upper lung zones, also increased from the previous exam. Upper Abdomen: No acute abnormality. Nonobstructing stone within the upper pole of the right kidney, image 154/2. Musculoskeletal: Multiple lucent bone lesions are identified throughout the visualized bony thorax. These appears similar in distribution to the previous exam. Unchanged mild compression deformity involving the superior endplate of the T11 vertebra. IMPRESSION: 1. Decreased volume of bilateral pleural effusions. 2. Progressive ground-glass and airspace densities within the left upper lobe. There also multiple nodular densities with surrounding ground-glass attenuation throughout both upper lung zones, also increased from the previous exam. Findings are favored to represent multifocal pneumonia. Nodular densities with surrounding ground-glass  attenuation may be seen in the setting of fungal infection particularly and anion 0 compromised in visual. 3. Multiple lucent bone lesions are identified throughout the visualized bony thorax. These appears similar in distribution to the previous exam. Unchanged mild compression deformity involving the superior endplate of the T11 vertebra. Imaging findings are concerning for metastatic disease versus multiple myeloma. 4. Nonobstructing right renal stone. 5. Coronary artery calcifications. 6.  Aortic Atherosclerosis (ICD10-I70.0). Electronically Signed   By: Kimberley Penman M.D.   On: 07/26/2023 04:56   VAS US  UPPER EXTREMITY VENOUS DUPLEX Result Date: 07/25/2023 UPPER VENOUS STUDY  Patient Name:  Mekhai Venuto.  Date of Exam:   07/24/2023 Medical Rec #: [id number]           Accession #:    [id number] Date of Birth: 02/05/1966           Patient  Gender: M Patient Age:   89 years Exam Location:  [facility] Procedure:      VAS US  UPPER EXTREMITY VENOUS DUPLEX Referring Phys: Donavon Fudge HOFFMAN --------------------------------------------------------------------------------  Indications: Edema Risk Factors: Multiple myeloma / chemotherapy. Comparison Study: No previous exams Performing Technologist: Jody Hill RVT, RDMS  Examination Guidelines: A complete evaluation includes B-mode imaging, spectral Doppler, color Doppler, and power Doppler as needed of all accessible portions of each vessel. Bilateral testing is considered an integral part of a complete examination. Limited examinations for reoccurring indications may be performed as noted.  Right Findings: Subclvian vein not visualized due to patient recieving HD bedside  Left Findings: +----------+------------+---------+-----------+----------+---------------------+ LEFT      CompressiblePhasicitySpontaneousProperties       Summary        +----------+------------+---------+-----------+----------+---------------------+ IJV           Full       Yes       Yes                                    +----------+------------+---------+-----------+----------+---------------------+ Subclavian    Full       Yes       Yes                                    +----------+------------+---------+-----------+----------+---------------------+ Axillary      Full       Yes       Yes                                    +----------+------------+---------+-----------+----------+---------------------+ Brachial      Full       Yes       Yes                                    +----------+------------+---------+-----------+----------+---------------------+ Radial        Full                                                        +----------+------------+---------+-----------+----------+---------------------+ Ulnar         Full  only one of paired                                                             visualized       +----------+------------+---------+-----------+----------+---------------------+ Cephalic      Full                                                        +----------+------------+---------+-----------+----------+---------------------+ Basilic       Full       Yes       Yes                                    +----------+------------+---------+-----------+----------+---------------------+ Large non vascularized heterogenous area in mid and distal upper arm with cystic and solid appearing areas.  Summary:  Right: Unable to visualize subclavian vein on this exam.  Left: No evidence of deep vein thrombosis in the upper extremity. No evidence of superficial vein thrombosis in the upper extremity. Large non vascularized heterogenous area in mid and distal upper arm with cystic and solid appearing areas.  *See table(s) above for measurements and observations.  Diagnosing physician: Runell Countryman Electronically signed by Runell Countryman on 07/25/2023 at 9:32:22 AM.    Final    DG Chest Port 1 View Result Date: 07/24/2023 CLINICAL DATA:  Status post tracheostomy. EXAM: PORTABLE CHEST 1 VIEW COMPARISON:  Chest radiograph dated 07/22/2023. FINDINGS: Tracheostomy with tip approximately 5.5 cm above the carina. Enteric tube extends below the diaphragm with tip beyond the inferior margin of the image. Right IJ central venous line in similar position. Similar appearance of bilateral pulmonary opacities. No pleural effusion or pneumothorax. The cardiac silhouette. No acute osseous pathology. IMPRESSION: 1. Tracheostomy with tip approximately 5.5 cm above the carina. 2. Similar appearance of bilateral pulmonary opacities. Electronically Signed   By: Angus Bark M.D.   On: 07/24/2023 14:17   DG CHEST PORT 1 VIEW Result Date: 07/22/2023 CLINICAL DATA:  Aspiration pneumonia. EXAM: PORTABLE CHEST 1 VIEW COMPARISON:  Jul 18, 2023. FINDINGS: Stable  cardiomediastinal silhouette. Tracheostomy and feeding tubes are unchanged. Right internal jugular catheter is unchanged. Increased bilateral lung opacities are noted diffusely consistent with edema or atypical inflammation. Severe degenerative changes are seen involving both glenohumeral joints. IMPRESSION: Stable support apparatus. Increased bilateral lung opacities concerning for edema or pneumonia. Electronically Signed   By: Rosalene Colon M.D.   On: 07/22/2023 14:39   DG Abd Portable 1V Result Date: 07/22/2023 CLINICAL DATA:  Ileus. EXAM: PORTABLE ABDOMEN - 1 VIEW COMPARISON:  July 07, 2023. FINDINGS: The bowel gas pattern is normal. Distal tip of feeding tube is seen in expected position of proximal stomach. No radio-opaque calculi or other significant radiographic abnormality are seen. IMPRESSION: No abnormal bowel dilatation. Electronically Signed   By: Rosalene Colon M.D.   On: 07/22/2023 14:38   CT CHEST WO CONTRAST Result Date: 07/20/2023 CLINICAL DATA:  Sepsis EXAM: CT CHEST WITHOUT CONTRAST TECHNIQUE: Multidetector CT imaging of the chest was performed following the standard protocol without IV contrast. RADIATION  DOSE REDUCTION: This exam was performed according to the departmental dose-optimization program which includes automated exposure control, adjustment of the mA and/or kV according to patient size and/or use of iterative reconstruction technique. COMPARISON:  None Available. FINDINGS: Cardiovascular: No significant vascular findings. The heart is mildly enlarged. There is no pericardial effusion. Aorta is normal in size. Central venous catheter tip ends in the SVC. Mediastinum/Nodes: Endotracheal tube tip is 2.4 cm above the carina. Enteric tube is seen throughout nondilated esophagus. No enlarged lymph nodes are identified allowing for lack of intravenous contrast. The visualized thyroid gland is within normal limits. Lungs/Pleura: There are small bilateral pleural effusions. There is  bilateral lower lobe airspace consolidation. Multifocal patchy airspace areas and ill-defined ground-glass slightly nodular opacities are seen throughout the bilateral upper lobes and minimally in the right middle lobe, also likely infectious/inflammatory. There is no pneumothorax. Upper Abdomen: Gallstones are present. Enteric tube is partially visualized in the stomach. Musculoskeletal: There are severe degenerative changes in both shoulders. The bones are diffusely osteopenic. There are scattered lucent lesions throughout the thoracic spine measuring up to 8 mm. There is minimal compression deformity of the superior endplate of T11. IMPRESSION: 1. Bilateral lower lobe airspace consolidation compatible with pneumonia. 2. Multifocal patchy airspace areas and ill-defined ground-glass slightly nodular opacities throughout the bilateral upper lobes and minimally in the right middle lobe, also likely infectious/inflammatory. 3. Small bilateral pleural effusions. 4. Mild cardiomegaly. 5. Cholelithiasis. 6. Scattered lucent lesions throughout the thoracic spine measuring up to 8 mm, nonspecific. Findings may be related to multiple myeloma or metastatic disease. 7. Minimal compression deformity of the superior endplate of T11. Electronically Signed   By: Tyron Gallon M.D.   On: 07/20/2023 16:31   VAS US  LOWER EXTREMITY VENOUS (DVT) Result Date: 07/18/2023  Lower Venous DVT Study Patient Name:  Frank [name].  Date of Exam:   07/18/2023 Medical Rec #: [id number]           Accession #:    [id number] Date of Birth: [birth year]           Patient Gender: M Patient Age:   56 years Exam Location:  [facility] Procedure:      VAS US  LOWER EXTREMITY VENOUS (DVT) Referring Phys: ADITYA PALIWAL --------------------------------------------------------------------------------  Indications: Swelling, and Edema.  Risk Factors: Immobility Cancer myeloma. Limitations: Patient positioning. Comparison Study: None. Performing  Technologist: Estanislao Heimlich  Examination Guidelines: A complete evaluation includes B-mode imaging, spectral Doppler, color Doppler, and power Doppler as needed of all accessible portions of each vessel. Bilateral testing is considered an integral part of a complete examination. Limited examinations for reoccurring indications may be performed as noted. The reflux portion of the exam is performed with the patient in reverse Trendelenburg.  +-----+---------------+---------+-----------+----------+--------------+ RIGHTCompressibilityPhasicitySpontaneityPropertiesThrombus Aging +-----+---------------+---------+-----------+----------+--------------+ CFV  Full           Yes      Yes                                 +-----+---------------+---------+-----------+----------+--------------+ Large hematoma noted at top of right leg  +---------+---------------+---------+-----------+----------+-------------------+ LEFT     CompressibilityPhasicitySpontaneityPropertiesThrombus Aging      +---------+---------------+---------+-----------+----------+-------------------+ CFV      Full           Yes      Yes                                      +---------+---------------+---------+-----------+----------+-------------------+  SFJ      Full                                                             +---------+---------------+---------+-----------+----------+-------------------+ FV Prox  Full                                                             +---------+---------------+---------+-----------+----------+-------------------+ FV Mid   Full                                                             +---------+---------------+---------+-----------+----------+-------------------+ FV DistalFull                                                             +---------+---------------+---------+-----------+----------+-------------------+ PFV      Full                                                              +---------+---------------+---------+-----------+----------+-------------------+ POP      Full           Yes      Yes                                      +---------+---------------+---------+-----------+----------+-------------------+ PTV                              Yes                  Not well visualized +---------+---------------+---------+-----------+----------+-------------------+ PERO                             Yes                  Not well visualized +---------+---------------+---------+-----------+----------+-------------------+     Summary: RIGHT: - No evidence of common femoral vein obstruction.   LEFT: - There is no evidence of deep vein thrombosis in the lower extremity.  - No cystic structure found in the popliteal fossa.  *See table(s) above for measurements and observations. Electronically signed by Delaney Fearing on 07/18/2023 at 4:52:00 PM.    Final    DG Chest 1 View Result Date: 07/18/2023 CLINICAL DATA:  Endotracheal tube. EXAM: CHEST  1 VIEW COMPARISON:  Jul 17, 2023. FINDINGS: Endotracheal and feeding tube are in grossly good position. Right internal jugular catheter is unchanged. Mild  bibasilar subsegmental atelectasis is noted with small pleural effusions. Degenerative changes are seen involving both shoulder joints. IMPRESSION: Support apparatus as noted above. Bibasilar atelectasis and probable small pleural effusions are noted. Electronically Signed   By: Rosalene Colon M.D.   On: 07/18/2023 15:58   DG FL GUIDED LUMBAR PUNCTURE Result Date: 07/18/2023 CLINICAL DATA:  Metabolic encephalopathy EXAM: DIAGNOSTIC LUMBAR PUNCTURE UNDER FLUOROSCOPIC GUIDANCE COMPARISON:  I assessed the brain imaging of 07/17/2023 and the CT abdomen of 07/08/2023 in preparation for this exam. FLUOROSCOPY: Radiation Exposure Index (as provided by the fluoroscopic device): 3.4 mGy Kerma PROCEDURE: The risks (including hemorrhage, infection, and nerve damage,  among others), benefits, and alternatives to fluoroscopically guided lumbar puncture were discussed with the patient's wife by telephone, as the patient is unable to consent and has altered mental status. The patient's wife understood and elected for the patient to undergo the procedure. Standard time-out was employed. Following sterile skin prep and local anesthetic administration consisting of 1 percent lidocaine , a 22 gauge spinal needle was advanced without difficulty into the thecal sac at the L2-3 level under fluoroscopic guidance. Clear CSF was returned. Because the patient is intubated, I did not turn him onto his side to obtain a pressure measurement. A total of 11.5 cc of clear CSF was collected in 4 vials. The needle was subsequently removed and the skin cleansed and bandaged. No immediate complications were observed. IMPRESSION: Technically successful fluoroscopically guided lumbar puncture at the L2-3 level. Electronically Signed   By: Freida Jes M.D.   On: 07/18/2023 13:39   MR BRAIN WO CONTRAST Result Date: 07/18/2023 CLINICAL DATA:  Delirium EXAM: MRI HEAD WITHOUT CONTRAST TECHNIQUE: Multiplanar, multiecho pulse sequences of the brain and surrounding structures were obtained without intravenous contrast. COMPARISON:  CT head 07/17/2023. FINDINGS: Brain: No acute infarct. Punctate susceptibility in the left corona radiata suggestive of chronic microhemorrhage. No additional foci of hemorrhage appreciated. Scattered and confluent T2/FLAIR hyperintensity in the periventricular and subcortical white matter. No mass lesion or midline shift. Cerebellum is unremarkable. Normal appearance of midline structures. The basilar cisterns are patent. No extra-axial fluid collections. Ventricles: Normal size and configuration of the ventricles. Vascular: Skull base flow voids are visualized. Skull and upper cervical spine: Facet arthrosis in the visualized upper cervical spine most pronounced on the right  at C3-4. Redemonstration of multiple calvarial lesions corresponding to lytic lesions on CT. Sinuses/Orbits: The orbits are symmetric. Minimal mucosal thickening in the alveolar recess of the right maxillary sinus. There is a 1.3 x 1.0 x 1.5 cm circumscribed cystic appearing lesion in the subcutaneous tissues overlying the nasal bones slightly right of midline which demonstrates internal restricted diffusion. No evidence of connection to the intracranial compartment. Other: Large right mastoid effusion. Trace fluid in the left mastoid air cells. Fluid within the nasopharynx. Partially visualized enteric tube and endotracheal tube. IMPRESSION: No acute intracranial abnormality. Nonspecific white matter signal abnormality likely reflecting moderate chronic microvascular ischemic changes. Multiple calvarial lesions corresponding to lytic lesions on CT. Findings concerning for multiple myeloma versus metastasis. 1.5 cm cystic lesion in the subcutaneous tissues overlying the nasal bones likely reflecting epidermoid cyst versus dermoid. No evidence of connection to the intracranial compartment. Large right mastoid effusion. Electronically Signed   By: Denny Flack M.D.   On: 07/18/2023 12:54   CT HEAD WO CONTRAST ( ) Result Date: 07/17/2023 CLINICAL DATA:  58 year old male altered mental status. Respiratory failure. "Myeloma". EXAM: CT HEAD WITHOUT CONTRAST TECHNIQUE: Contiguous axial images were obtained from  the base of the skull through the vertex without intravenous contrast. RADIATION DOSE REDUCTION: This exam was performed according to the departmental dose-optimization program which includes automated exposure control, adjustment of the mA and/or kV according to patient size and/or use of iterative reconstruction technique. COMPARISON:  No prior head CT.  CT Abdomen and Pelvis 07/08/2023. FINDINGS: Brain: Cerebral volume is within normal limits for age. No midline shift, ventriculomegaly, mass effect, evidence  of mass lesion, intracranial hemorrhage or evidence of cortically based acute infarction. Gray-white differentiation maintained, mild to moderate for age scattered cerebral white matter hypodensity. Vascular: Calcified atherosclerosis at the skull base. No suspicious intracranial vascular hyperdensity. Skull: Scattered nonspecific round an oval lucent areas in the bilateral calvarium. No pathologic fracture identified. Sinuses/Orbits: Intubated. Right nasoenteric tube in place. Relatively well aerated paranasal sinuses, partial opacification of the right middle ear and mastoids. Other: Visualized orbits and scalp soft tissues are within normal limits. IMPRESSION: 1. No acute intracranial abnormality. Mild to moderate for age cerebral white matter changes most commonly due to small vessel disease. 2. Scattered lytic lesions in the skull, similar to the skeletal findings on 07/08/2023, and highly suspicious for Multiple Myeloma versus lytic metastases. 3. Intubated. Electronically Signed   By: Marlise Simpers M.D.   On: 07/17/2023 14:37   DG CHEST PORT 1 VIEW Result Date: 07/17/2023 CLINICAL DATA:  Acute respiratory failure.  Hypoxia. EXAM: PORTABLE CHEST 1 VIEW COMPARISON:  07/13/2023 FINDINGS: A feeding tube passes into the stomach although the distal tip position is not included on the film. Right IJ central line tip overlies the proximal SVC level. Low volume film with interval development of vascular congestion basilar predominant interstitial opacity, concerning for edema. Probable small left effusion. Cardiopericardial silhouette is at upper limits of normal for size. Degenerative changes noted in both shoulders. Telemetry leads overlie the chest. IMPRESSION: Low volume film with interval development of vascular congestion and basilar predominant interstitial opacity, concerning for edema. Probable small left effusion. Electronically Signed   By: Donnal Fusi M.D.   On: 07/17/2023 07:41    Labs:  CBC: Recent  Labs    08/10/23 0711 08/11/23 1059 08/12/23 0525 08/13/23 0531  WBC 9.1 6.8 8.3 8.8  HGB 10.4* 11.5* 10.9* 9.7*  HCT 33.2* 37.9* 34.3* 31.1*  PLT 299 287 259 252    COAGS: Recent Labs    07/05/23 2227 07/07/23 2030 07/10/23 0411 07/23/23 0416 07/24/23 0421 07/25/23 0409 07/26/23 0423 08/13/23 0908  INR 2.1* 1.9*  --   --   --   --   --  1.0  APTT  --  44*   < > 39* 45* 34 36  --    < > = values in this interval not displayed.    BMP: Recent Labs    08/10/23 0711 08/11/23 1059 08/12/23 0525 08/13/23 0531  NA 134* 137  137 132* 131*  K 4.4 4.8  4.9 4.2 5.2*  CL 96* 99  99 97* 100  CO2 24 21*  20* 23 20*  GLUCOSE 201* 213*  218* 185* 169*  BUN 121* 166*  170* 117* 170*  CALCIUM  7.9* 8.0*  8.2* 8.7* 7.9*  CREATININE 3.28* 3.60*  3.61* 2.64* 3.50*  GFRNONAA 21* 19*  19* 27* 19*    LIVER FUNCTION TESTS: Recent Labs    08/09/23 0719 08/10/23 0711 08/11/23 1059 08/12/23 0525 08/13/23 0531  BILITOT 1.0 1.1 0.8 0.9  --   AST 15 16 14* 13*  --   ALT  7 12 10 11   --   ALKPHOS 47 46 45 43  --   PROT 8.1 8.7* 8.2* 8.5*  --   ALBUMIN  2.2* 2.4* 2.3*  2.3* 2.4* 2.2*    TUMOR MARKERS: No results for input(s): "AFPTM", "CEA", "CA199", "CHROMGRNA" in the last 8760 hours.  Assessment and Plan:  Frank [name]. is a 58 y.o. male with recently diagnosed multiple myeloma in April 2025. Initially had presented to [facility] with generalized weakness and AMS. Pt was found to have AKI and multiple lytic lesions concerning for multiple myeloma, with this later being confirmed with biopsy. Pt was since transferred to [facility] on 07/03/23 and has been in the hospital with AMS, intubated and later with tracheostomy placement. Has had fluctuating fevers that oncology and medicine team have attributed to underlying myeloma. Is known to IR service from tunneled HD cath placed on 08/07/23. Due to persistent malnutrition, IR now consulted again for gastrostomy tube  placement.   Case reviewed with Dr. Marlena Sima who approved anatomy. Pt receiving 4hr dialysis treatment today and currently on continuous NG tube feeds. Will plan tentatively on gastrostomy tube placement being tomorrow, 5/29. Awaiting PT/INR. Tube feeds to be held at midnight. 0600 and 1400 heparin  doses to be held tomorrow. Abx ordered to be given tomorrow for surgical prophylaxis.   Risks and benefits discussed with the patient's wife including, but not limited to the need for a barium enema during the procedure, bleeding, infection, peritonitis, or damage to adjacent structures. All of the patient's wife's questions were answered, wife is agreeable to proceed. Consent signed and in chart.   Thank you for allowing our service to participate in Gurshan San Lohmeyer. 's care.  Electronically Signed: Nicolasa Barrett, PA-C   08/13/2023, 3:31 PM      I spent a total of 20 Minutes    in face to face in clinical consultation, greater than 50% of which was counseling/coordinating care for gastrostomy tube placement.

## 2023-08-13 NOTE — Progress Notes (Signed)
 Palliative:  HPI: 58 y.o. male with past medical history of OSA on CPAP, GERD, HTN, HLD, DM2, BPH, seizure disorder admitted on 07/03/2023 as a transfer from [facility] due to AMS. Workup revealed severe sepsis due to pneumonia and metabolic encephalopathy. He also has been newly diagnosed with multiple myeloma this admission and treatment started with Velcade  and Cytoxan . Admission has been complicated by pancytopenias, septic shock due to pneumonia with respiratory failure requiring intubation (intubated on 4/22), kidney injury- now on CRRT, is anuric. He is currently on vasopressor support. Palliative medicine consulted for GOC.    Chart reviewed. Met again with [facility] during dialysis. Tolerating HD treatment per RN - no issues. Ruston appears more fatigued. I explained purpose of HD to him. He nodded in understanding. Reviewed in brief he has been very sick. I asked if he was still hanging in there and wishing to continue and he nods his head yes. Difficult to know his true understanding of his condition since he is non-verbal. He indicates desire for ongoing full scope care during my visit today.   Emotional support provided.   Exam: Alert, appropriate responses. Resting in bed comfortably. Breathing regular, unlabored with rhonchi. Cortrak. Abd soft. L arm in brace.    Plan: - Full code, full scope  25 min  Vila Grayer, NP Palliative Medicine Team Pager [phone] (Please see [email or web address] for schedule) Team Phone [phone]

## 2023-08-13 NOTE — Progress Notes (Signed)
 Brief Nutrition Note  Contacted by ID Team PharmD regarding pt's feeds and possible interference with efficient voriconazole  absorption. Will increase rate to plan on pt's needs being met over 20 hours versus continuous (held one hour before and after administration). Discussed plan with RN. Reports that tentatively, pt is scheduled to have PEG placed tomorrow. Will adjust to bolus feeds once PEG is in place.   New plan is as follows: Continue tube feeding via cortrak: Vital 1.5 at 78 ml/h (1560 ml per day) Prosource TF20 60 ml BID Provides 2500 kcal, 145 gm protein, 1192 ml free water  daily   Once PEG in place, adjust to the following: 6 cartons of Osmolite 1.5 per day (1.5 cartons x 4 feeds per day) Will ensure there is a 2 hour break for voriconazole  administration BID Start with 1/2 a carton and increase by 1/2 a carton every 2 feeds if tolerated to goal. Prosource TF20 60mL 2x/d This will provide 2290 kcal, 129g of protein, and of free water  TF+juven = 2480kcal, 134g protein  Frank [name], RD, LDN Registered Dietitian II Please reach out via secure chat

## 2023-08-13 NOTE — Progress Notes (Signed)
 SLP Cancellation Note  Patient Details Name: Frank [name]. MRN: [id number] DOB: November 05, 1965   Cancelled treatment:       Reason Eval/Treat Not Completed: Patient at procedure or test/unavailable   Shadman Tozzi, Hardin Leys 08/13/2023, 10:15 AM

## 2023-08-13 NOTE — Progress Notes (Signed)
 PROGRESS NOTE  Frank [name]. WRU:[id number] DOB: [birth year]   PCP: Serita Danes, MD  Patient is from: Home  DOA: 07/03/2023 LOS: 41  Chief complaints No chief complaint on file.    Brief Narrative / Interim history: 58 year old M with PMH of OSA on CPAP, pathological fracture and multiple ORIF left humerus likely due to multiple myeloma, CKD-3A, A-fib not on AC, asthma, HTN, HLD and tobacco use disorder who had been feeling weak for about 1 week, admitted to [facility] with concern for sepsis, community-acquired pneumonia and acute encephalopathy.  Patient was found to have lytic lesions, he was transferred to [facility] for oncology treatment.  In the meantime, he developed encephalopathy, acute renal failure and remained very sick.  He remained in the ICU for 36 days.  Underwent tracheostomy. Deemed to be not stable for PEG tube placement.  He has been on tube feeds via cortrack.  Also started on dialysis after CRRT.   Significant events 4/17-admitted to medical floor with oncology consultation to treat multiple myeloma. 4/19-transferred to ICU intubated, encephalopathic. 4/20-A-fib with RVR.  Started heparin . Groin and biopsy site hematoma requiring multiple transfusions EEG 4/25 >> moderate to severe encephalopathy without any seizures or epileptiform discharges 4/23 respiratory culture >> MRSE 4/28 remains intubated on CRRT, down to one pressor  4/29 Received Velcade  and Cytoxan  chemotherapy for newly diagnosed multiple myeloma. 5/2 LP and ETT exchange  5/5 1 PRBC. Palliative care met w family  5/6 IVIG.  Added midodrine  after failed vaso wean. Abx/fungal changed to levaquin   5/8 Trach placed. Some post op bleeding improved with thrombi-pad 5/10 voriconazole  added back based on CT Chest findings, MRI brain with no acute changes 5/13 more alert and able to follow some commands. 5/19 trach dislodgment. 5/22 more awake. 5/23, transferred to  floor.  Subjective: Seen and examined earlier this morning while on HD.  No major events overnight or this morning.  No issues.  Objective: Vitals:   08/13/23 1230 08/13/23 1300 08/13/23 1330 08/13/23 1400  BP: (!) 149/107 (!) 153/116 (!) 144/108 (!) 150/94  Pulse: (!) 119 (!) 119 (!) 120 (!) 122  Resp: (!) 24 (!) 24 17 20   Temp:      TempSrc:      SpO2: 95% 96% 96% 97%  Weight:      Height:        Examination:  GENERAL: No apparent distress.  Nontoxic. HEENT: MMM.  Vision and hearing grossly intact.  Cortrack. NECK: Tracheostomy in place. RESP:  No IWOB.  Very rhonchorous mainly due to secretion. CVS:  RRR. Heart sounds normal.  ABD/GI/GU: BS+. Abd soft, NTND.  Rectal tube. MSK/EXT: Moves right arm.  Left arm in brace.  Minimally moves BLE.  No edema.  NEURO: AA.  Not able to assess orientation.  No facial asymmetry.  PERRL.  Follows commands.  Extremity exam as above. PSYCH: Calm.  No distress or agitation.  Consultants:  Neurology Nephrology Oncology Interventional radiology Infectious disease Palliative   Microbiology summarized: 4/28 BCx > no growth, final 4/30 respiratory culture-few PMN, rare budding yeast, abundant GPC> few aspergillus fumigatus 5/2-Quantiferon  neg 5/4-trach asp  rare GPC, rare mold  5/5-PCP smear trach asp neg  5/9-trach asp abundant staph hemolyticus, few budding yeast 5/11-blood cultures negative 5/15-trach aspirate negative   CSF studies 1 RBC, 1 WBC, glucose 106 CSF culture-2 organisms acute, rare WBC> NGTD Cryptococcal antigen negative meningitis and encephalitis panel negative VDRL  non-reactive   Assessment and plan:  Acute hypoxic respiratory failure and respiratory insufficiency status post tracheostomy:  -On trach collar. -Continue chest physiotherapy -Pulmonology following intermittently.  Not a candidate for decannulation yet   Severe acute metabolic encephalopathy with hypoactive delirium, seizure disorder: Normal MRI  brain.  LP negative for meningitis or encephalitis.  EEG negative for seizure but encephalopathy.  Home Depakote  discontinued in ICU.  Tremor and agitation reportedly improved with Klonopin .  Currently awake and alert but not able to answer orientation questions.  Follows commands. -Reorientation and delirium precaution -Continue low-dose Klonopin  -Depacon  resumed on 5/25   Aspergillosis with pneumonia:  -On voriconazole  per ID.  ID to decide duration.  -Also on acyclovir .  NIDDM-2 with hyperglycemia: A1c 6.7% in 01/2023 Recent Labs  Lab 08/12/23 1552 08/12/23 2022 08/13/23 0017 08/13/23 0342 08/13/23 0740  GLUCAP 178* 172* 186* 173* 200*  -Increase Semglee  from 8 to 10 units twice daily -Continue NovoLog  4 units every 4 hours -Continue SSI-resident every 4 hours -Recheck hemoglobin A1c  Multiple myeloma: Started on Velcade  and Cytoxan .  Followed by oncology.   AKI on CKD-3A/hyponatremia/hyperkalemia: Due to multiple myeloma, hypercalcemia and ACE inhibitors? -S/p CRRT while in ICU.  Tunneled HD placed on 5/22.  Now on HD -Nephrology managing   Paroxysmal A-fib: Now in sinus rhythm.  Rate controlled without meds.   Unable to anticoagulate due to hematoma and intolerance.   Obstructive sleep apnea: Now with trach collar.  Recurrent fever: Evaluated by ID.  Felt to be due to malignancy. - Symptomatic management  Anxiety/bipolar disorder - Continue home Paxil  - Continue Depakote  - Low-dose Klonopin    Goal of care: Poor long-term prognosis.  Palliative from prior providers had discussion with patient's wife who is an EMT.  Remains full code with full scope of care. -IR consulted for PEG tube placement on 5/25   Severe malnutrition Body mass index is 28.97 kg/m. Nutrition Problem: Severe Malnutrition Etiology: acute illness (prolonged illness, interuptions in feeding) Signs/Symptoms: moderate fat depletion, moderate muscle depletion, percent weight loss (9.6% x 1  month) Percent weight loss: 9.6 % Interventions: Refer to RD note for recommendations, Tube feeding  Pressure skin injury: Pressure Injury 07/05/23 Heel Left;Right Unstageable - Full thickness tissue loss in which the base of the injury is covered by slough (yellow, tan, gray, green or brown) and/or eschar (tan, brown or black) in the wound bed. (Active)  07/05/23 1108  Location: Heel  Location Orientation: Left;Right  Staging: Unstageable - Full thickness tissue loss in which the base of the injury is covered by slough (yellow, tan, gray, green or brown) and/or eschar (tan, brown or black) in the wound bed.  Wound Description (Comments):   Present on Admission: Yes  Dressing Type Foam - Lift dressing to assess site every shift 08/12/23 0820     Pressure Injury 07/11/23 Buttocks Mid Unstageable - Full thickness tissue loss in which the base of the injury is covered by slough (yellow, tan, gray, green or brown) and/or eschar (tan, brown or black) in the wound bed. 8 cm x 6 cmx 0.1 cm (Active)  07/11/23 1600  Location: Buttocks  Location Orientation: Mid  Staging: Unstageable - Full thickness tissue loss in which the base of the injury is covered by slough (yellow, tan, gray, green or brown) and/or eschar (tan, brown or black) in the wound bed.  Wound Description (Comments): 8 cm x 6 cmx 0.1 cm  Present on Admission: No  Dressing Type Foam - Lift dressing to assess site every shift 08/12/23 0820  Pressure Injury 07/27/23 Buttocks Right;Lower (Active)  07/27/23 1700  Location: Buttocks  Location Orientation: Right;Lower  Staging:   Wound Description (Comments):   Present on Admission: Yes  Dressing Type Foam - Lift dressing to assess site every shift 08/12/23 0820   DVT prophylaxis:  heparin  injection 5,000 Units Start: 07/25/23 0600 Place and maintain sequential compression device Start: 07/14/23 1033  Code Status: Full code Family Communication: None at bedside. Level of care:  Progressive Status is: Inpatient Remains inpatient appropriate because: Respiratory failure, encephalopathy, renal failure   Final disposition: LTAC   35 minutes with more than 50% spent in reviewing records, counseling patient/family and coordinating care.   Sch Meds:  Scheduled Meds:  acyclovir  200 mg Per Tube BID   arformoterol   15 mcg Nebulization BID   collagenase   Topical Daily   feeding supplement (PROSource TF20)  60 mL Per Tube BID   fiber  1 packet Per Tube BID   folic acid   1 mg Per Tube Daily   heparin  injection (subcutaneous)  5,000 Units Subcutaneous Q8H   heparin  sodium (porcine)  3,800 Units Intracatheter Once   insulin  aspart  0-20 Units Subcutaneous Q4H   insulin  aspart  4 Units Subcutaneous Q4H   insulin  glargine-yfgn  8 Units Subcutaneous BID   multivitamin  1 tablet Per Tube QHS   nicotine   7 mg Transdermal Daily   nutrition supplement (JUVEN)  1 packet Per Tube BID BM   mouth rinse  15 mL Mouth Rinse 4 times per day   PARoxetine   40 mg Per Tube Daily   pneumococcal 20-valent conjugate vaccine  0.5 mL Intramuscular Tomorrow-1000   revefenacin   175 mcg Nebulization Daily   sodium chloride  flush  10-40 mL Intracatheter Q12H   sodium chloride  flush  10-40 mL Intracatheter Q12H   thiamine   100 mg Per Tube Daily   voriconazole   350 mg Per Tube Q12H   Continuous Infusions:  [START ON 08/14/2023]  ceFAZolin  (ANCEF ) IV     feeding supplement (VITAL 1.5 CAL) 65 mL/hr at 08/13/23 1100   valproate sodium  Stopped (08/13/23 0952)   PRN Meds:.acetaminophen  (TYLENOL ) oral liquid 160 mg/5 mL **OR** acetaminophen , clonazePAM , iohexol , ipratropium-albuterol , [START ON 08/15/2023] ondansetron  (ZOFRAN ) IV, mouth rinse, oxyCODONE , polyvinyl alcohol , sodium chloride  flush, sodium chloride  flush  Antimicrobials: Anti-infectives (From admission, onward)    Start     Dose/Rate Route Frequency Ordered Stop   08/14/23 0000  ceFAZolin  (ANCEF ) IVPB 2g/100 mL premix        2  g 200 mL/hr over 30 Minutes Intravenous To Radiology 08/13/23 1055 08/15/23 0000   08/09/23 1000  acyclovir (ZOVIRAX) 200 MG capsule 200 mg       Note to Pharmacy: Give after dialysis the pm dose, on dialysis days   200 mg Per Tube 2 times daily 08/08/23 2358     08/09/23 0100  acyclovir (ZOVIRAX) 200 MG capsule 200 mg       Note to Pharmacy: Give after dialysis the pm dose, on dialysis days   200 mg Per Tube  Once 08/09/23 0009 08/09/23 0013   08/08/23 2345  acyclovir (ZOVIRAX) 200 MG capsule 200 mg  Status:  Discontinued       Note to Pharmacy: Give after dialysis the pm dose, on dialysis days   200 mg Oral 2 times daily 08/08/23 2257 08/08/23 2358   08/07/23 1530  ceFAZolin  (ANCEF ) IVPB 2g/100 mL premix        over 30 Minutes Intravenous Continuous  PRN 08/07/23 1555 08/07/23 1530   08/05/23 2200  voriconazole  (VFEND ) tablet 350 mg        350 mg Per Tube Every 12 hours 08/05/23 1511     08/01/23 1200  vancomycin  (VANCOCIN ) IVPB 1000 mg/200 mL premix        1,000 mg 200 mL/hr over 60 Minutes Intravenous Every M-W-F (Hemodialysis) 07/31/23 0649 08/01/23 1652   07/30/23 1200  vancomycin  (VANCOCIN ) IVPB 1000 mg/200 mL premix        1,000 mg 200 mL/hr over 60 Minutes Intravenous Every M-W-F (Hemodialysis) 07/30/23 0851 07/30/23 1241   07/29/23 0900  vancomycin  (VANCOCIN ) IVPB 1000 mg/200 mL premix        1,000 mg 200 mL/hr over 60 Minutes Intravenous  Once 07/29/23 0812 07/29/23 0943   07/28/23 1204  vancomycin  variable dose per unstable renal function (pharmacist dosing)  Status:  Discontinued         Does not apply See admin instructions 07/28/23 1204 07/31/23 0649   07/28/23 1000  voriconazole  (VFEND ) 350 mg in sodium chloride  0.9 % 100 mL IVPB  Status:  Discontinued       Placed in "Followed by" Linked Group   4 mg/kg  88.6 kg (Adjusted) 67.5 mL/hr over 120 Minutes Intravenous Every 12 hours 07/28/23 0748 08/05/23 1511   07/27/23 1000  voriconazole  (VFEND ) 400 mg in sodium chloride   0.9 % 100 mL IVPB  Status:  Discontinued       Placed in "Followed by" Linked Group   4 mg/kg  100.6 kg 70 mL/hr over 120 Minutes Intravenous Every 12 hours 07/26/23 0826 07/28/23 0748   07/27/23 0945  vancomycin  (VANCOREADY) IVPB 2000 mg/400 mL        2,000 mg 200 mL/hr over 120 Minutes Intravenous  Once 07/27/23 0849 07/27/23 1237   07/26/23 1515  Ampicillin -Sulbactam (UNASYN ) 3 g in sodium chloride  0.9 % 100 mL IVPB  Status:  Discontinued        3 g 200 mL/hr over 30 Minutes Intravenous Every 8 hours 07/26/23 1418 07/27/23 0830   07/26/23 0915  voriconazole  (VFEND ) 600 mg in sodium chloride  0.9 % 150 mL IVPB       Placed in "Followed by" Linked Group   6 mg/kg  100.6 kg 105 mL/hr over 120 Minutes Intravenous Every 12 hours 07/26/23 0826 07/27/23 0048   07/26/23 0900  Ampicillin -Sulbactam (UNASYN ) 3 g in sodium chloride  0.9 % 100 mL IVPB  Status:  Discontinued        3 g 200 mL/hr over 30 Minutes Intravenous Every 12 hours 07/26/23 0826 07/26/23 1418   07/23/23 1000  levofloxacin  (LEVAQUIN ) IVPB 500 mg  Status:  Discontinued       Placed in "Followed by" Linked Group   500 mg 100 mL/hr over 60 Minutes Intravenous Every 24 hours 07/22/23 1213 07/23/23 1001   07/23/23 1000  levofloxacin  (LEVAQUIN ) IVPB 500 mg  Status:  Discontinued       Placed in "Followed by" Linked Group   500 mg 100 mL/hr over 60 Minutes Intravenous Every 24 hours 07/23/23 1001 07/28/23 0804   07/22/23 1300  levofloxacin  (LEVAQUIN ) IVPB 750 mg       Placed in "Followed by" Linked Group   750 mg 100 mL/hr over 90 Minutes Intravenous  Once 07/22/23 1213 07/22/23 1542   07/21/23 1600  voriconazole  (VFEND ) 300 mg in sodium chloride  0.9 % 100 mL IVPB  Status:  Discontinued        300 mg 65  mL/hr over 120 Minutes Intravenous Every 12 hours 07/21/23 1430 07/22/23 1415   07/21/23 1000  voriconazole  (VFEND ) tablet 200 mg  Status:  Discontinued        200 mg Oral Every 12 hours 07/20/23 0944 07/21/23 1015   07/20/23  1030  voriconazole  (VFEND ) 530 mg in sodium chloride  0.9 % 150 mL IVPB        6 mg/kg  88.6 kg (Adjusted) 101.5 mL/hr over 120 Minutes Intravenous Every 12 hours 07/20/23 0944 07/21/23 0030   07/19/23 1400  meropenem  (MERREM ) 1 g in sodium chloride  0.9 % 100 mL IVPB  Status:  Discontinued        1 g 200 mL/hr over 30 Minutes Intravenous Every 8 hours 07/19/23 1143 07/22/23 1213   07/18/23 0600  meropenem  (MERREM ) 1 g in sodium chloride  0.9 % 100 mL IVPB  Status:  Discontinued        1 g 200 mL/hr over 30 Minutes Intravenous Every 24 hours 07/17/23 0916 07/19/23 1143   07/17/23 0700  meropenem  (MERREM ) 1 g in sodium chloride  0.9 % 100 mL IVPB  Status:  Discontinued        1 g 200 mL/hr over 30 Minutes Intravenous Every 12 hours 07/17/23 0601 07/17/23 0916   07/15/23 1300  vancomycin  (VANCOCIN ) IVPB 1000 mg/200 mL premix  Status:  Discontinued        1,000 mg 200 mL/hr over 60 Minutes Intravenous Every 24 hours 07/14/23 1129 07/22/23 1319   07/14/23 1215  vancomycin  (VANCOREADY) IVPB 1750 mg/350 mL        1,750 mg 175 mL/hr over 120 Minutes Intravenous  Once 07/14/23 1129 07/14/23 1449   07/09/23 1800  piperacillin -tazobactam (ZOSYN ) IVPB 3.375 g  Status:  Discontinued        3.375 g 12.5 mL/hr over 240 Minutes Intravenous Every 12 hours 07/09/23 0756 07/09/23 0844   07/09/23 1400  piperacillin -tazobactam (ZOSYN ) IVPB 3.375 g  Status:  Discontinued        3.375 g 12.5 mL/hr over 240 Minutes Intravenous Every 8 hours 07/09/23 0844 07/17/23 0557   07/08/23 1300  piperacillin -tazobactam (ZOSYN ) IVPB 3.375 g  Status:  Discontinued        3.375 g 12.5 mL/hr over 240 Minutes Intravenous Every 8 hours 07/08/23 1129 07/09/23 0756   07/05/23 2200  azithromycin  (ZITHROMAX ) 500 mg in sodium chloride  0.9 % 250 mL IVPB        500 mg 250 mL/hr over 60 Minutes Intravenous Daily at bedtime 07/05/23 2022 07/06/23 2302   07/05/23 2115  azithromycin  (ZITHROMAX ) 250 mg in dextrose  5 % 125 mL IVPB  Status:   Discontinued       Note to Pharmacy: Missed dose this morning   250 mg 127.5 mL/hr over 60 Minutes Intravenous Every 24 hours 07/05/23 2016 07/05/23 2021   07/04/23 0900  fluconazole  (DIFLUCAN ) IVPB 200 mg  Status:  Discontinued        200 mg 100 mL/hr over 60 Minutes Intravenous Every 48 hours 07/04/23 0722 07/09/23 0809   07/03/23 0600  cefTRIAXone  (ROCEPHIN ) 1 g in sodium chloride  0.9 % 100 mL IVPB  Status:  Discontinued        1 g 200 mL/hr over 30 Minutes Intravenous Every 24 hours 07/03/23 0508 07/08/23 1104   07/03/23 0600  azithromycin  (ZITHROMAX ) tablet 250 mg  Status:  Discontinued        250 mg Oral Daily 07/03/23 0508 07/05/23 2016        I  have personally reviewed the following labs and images: CBC: Recent Labs  Lab 08/09/23 0719 08/10/23 0711 08/11/23 1059 08/12/23 0525 08/13/23 0531  WBC 8.7 9.1 6.8 8.3 8.8  NEUTROABS 5.7 7.5 5.5 6.9 7.1  HGB 10.1* 10.4* 11.5* 10.9* 9.7*  HCT 33.4* 33.2* 37.9* 34.3* 31.1*  MCV 104.4* 100.0 102.4* 99.4 101.3*  PLT 284 299 287 259 252   BMP &GFR Recent Labs  Lab 08/07/23 0256 08/08/23 0450 08/09/23 0719 08/10/23 0711 08/11/23 1059 08/12/23 0525 08/13/23 0531  NA 136 132*  131* 137 134* 137  137 132* 131*  K 4.6 3.4*  3.4* 4.7 4.4 4.8  4.9 4.2 5.2*  CL 101 96*  96* 100 96* 99  99 97* 100  CO2 22 25  25  21* 24 21*  20* 23 20*  GLUCOSE 199* 158*  158* 211* 201* 213*  218* 185* 169*  BUN 172* 101*  103* 165* 121* 166*  170* 117* 170*  CREATININE 4.90* 2.83*  2.86* 4.10* 3.28* 3.60*  3.61* 2.64* 3.50*  CALCIUM  7.9* 7.7*  7.6* 7.6* 7.9* 8.0*  8.2* 8.7* 7.9*  MG 2.7* 2.5* 2.6* 2.3 2.7*  --   --   PHOS 4.5 RESULTS UNAVAILABLE DUE TO INTERFERING SUBSTANCE 4.0 NOT CALCULATED 6.8*  --  RESULTS UNAVAILABLE DUE TO INTERFERING SUBSTANCE   Estimated Creatinine Clearance: 27.4 mL/min (A) (by C-G formula based on SCr of 3.5 mg/dL (H)). Liver & Pancreas: Recent Labs  Lab 08/08/23 0450 08/09/23 0719 08/10/23 0711  08/11/23 1059 08/12/23 0525 08/13/23 0531  AST 15 15 16  14* 13*  --   ALT 10 7 12 10 11   --   ALKPHOS 58 47 46 45 43  --   BILITOT 0.5 1.0 1.1 0.8 0.9  --   PROT 8.8* 8.1 8.7* 8.2* 8.5*  --   ALBUMIN  2.2*  2.2* 2.2* 2.4* 2.3*  2.3* 2.4* 2.2*   No results for input(s): "LIPASE", "AMYLASE" in the last 168 hours. Recent Labs  Lab 08/12/23 2124  AMMONIA 14   Diabetic: No results for input(s): "HGBA1C" in the last 72 hours. Recent Labs  Lab 08/12/23 1552 08/12/23 2022 08/13/23 0017 08/13/23 0342 08/13/23 0740  GLUCAP 178* 172* 186* 173* 200*   Cardiac Enzymes: No results for input(s): "CKTOTAL", "CKMB", "CKMBINDEX", "TROPONINI" in the last 168 hours. No results for input(s): "PROBNP" in the last 8760 hours. Coagulation Profile: Recent Labs  Lab 08/13/23 0908  INR 1.0   Thyroid Function Tests: No results for input(s): "TSH", "T4TOTAL", "FREET4", "T3FREE", "THYROIDAB" in the last 72 hours. Lipid Profile: No results for input(s): "CHOL", "HDL", "LDLCALC", "TRIG", "CHOLHDL", "LDLDIRECT" in the last 72 hours. Anemia Panel: No results for input(s): "VITAMINB12", "FOLATE", "FERRITIN", "TIBC", "IRON", "RETICCTPCT" in the last 72 hours. Urine analysis:    Component Value Date/Time   COLORURINE STRAW (A) 07/05/2023 1912   APPEARANCEUR CLEAR 07/05/2023 1912   LABSPEC 1.008 07/05/2023 1912   PHURINE 7.0 07/05/2023 1912   GLUCOSEU NEGATIVE 07/05/2023 1912   HGBUR MODERATE (A) 07/05/2023 1912   BILIRUBINUR NEGATIVE 07/05/2023 1912   KETONESUR NEGATIVE 07/05/2023 1912   PROTEINUR 30 (A) 07/05/2023 1912   NITRITE NEGATIVE 07/05/2023 1912   LEUKOCYTESUR NEGATIVE 07/05/2023 1912   Sepsis Labs: Invalid input(s): "PROCALCITONIN", "LACTICIDVEN"  Microbiology: No results found for this or any previous visit (from the past 240 hours).  Radiology Studies: No results found.    Neomi Laidler T. Zelene Barga [facility]  If 7PM-7AM, please contact  night-coverage [email or web address] 08/13/2023, 2:11 PM

## 2023-08-14 ENCOUNTER — Inpatient Hospital Stay (HOSPITAL_COMMUNITY)

## 2023-08-14 ENCOUNTER — Other Ambulatory Visit: Payer: Self-pay | Admitting: Radiology

## 2023-08-14 ENCOUNTER — Encounter (HOSPITAL_COMMUNITY): Payer: Self-pay | Admitting: Hematology & Oncology

## 2023-08-14 DIAGNOSIS — N179 Acute kidney failure, unspecified: Secondary | ICD-10-CM | POA: Diagnosis not present

## 2023-08-14 DIAGNOSIS — R531 Weakness: Secondary | ICD-10-CM | POA: Diagnosis not present

## 2023-08-14 DIAGNOSIS — G9341 Metabolic encephalopathy: Secondary | ICD-10-CM | POA: Diagnosis not present

## 2023-08-14 DIAGNOSIS — J9601 Acute respiratory failure with hypoxia: Secondary | ICD-10-CM | POA: Diagnosis not present

## 2023-08-14 HISTORY — PX: IR GASTROSTOMY TUBE MOD SED: IMG625

## 2023-08-14 LAB — GLUCOSE, CAPILLARY
Glucose-Capillary: 147 mg/dL — ABNORMAL HIGH (ref 70–99)
Glucose-Capillary: 165 mg/dL — ABNORMAL HIGH (ref 70–99)
Glucose-Capillary: 174 mg/dL — ABNORMAL HIGH (ref 70–99)
Glucose-Capillary: 180 mg/dL — ABNORMAL HIGH (ref 70–99)
Glucose-Capillary: 184 mg/dL — ABNORMAL HIGH (ref 70–99)
Glucose-Capillary: 194 mg/dL — ABNORMAL HIGH (ref 70–99)
Glucose-Capillary: 230 mg/dL — ABNORMAL HIGH (ref 70–99)

## 2023-08-14 LAB — CBC WITH DIFFERENTIAL/PLATELET
Abs Immature Granulocytes: 0.11 10*3/uL — ABNORMAL HIGH (ref 0.00–0.07)
Basophils Absolute: 0 10*3/uL (ref 0.0–0.1)
Basophils Relative: 0 %
Eosinophils Absolute: 0 10*3/uL (ref 0.0–0.5)
Eosinophils Relative: 0 %
HCT: 33.8 % — ABNORMAL LOW (ref 39.0–52.0)
Hemoglobin: 10.7 g/dL — ABNORMAL LOW (ref 13.0–17.0)
Immature Granulocytes: 1 %
Lymphocytes Relative: 12 %
Lymphs Abs: 1 10*3/uL (ref 0.7–4.0)
MCH: 31.6 pg (ref 26.0–34.0)
MCHC: 31.7 g/dL (ref 30.0–36.0)
MCV: 99.7 fL (ref 80.0–100.0)
Monocytes Absolute: 0.7 10*3/uL (ref 0.1–1.0)
Monocytes Relative: 9 %
Neutro Abs: 6.6 10*3/uL (ref 1.7–7.7)
Neutrophils Relative %: 78 %
Platelets: 207 10*3/uL (ref 150–400)
RBC: 3.39 MIL/uL — ABNORMAL LOW (ref 4.22–5.81)
RDW: 21.8 % — ABNORMAL HIGH (ref 11.5–15.5)
WBC: 8.4 10*3/uL (ref 4.0–10.5)
nRBC: 0 % (ref 0.0–0.2)

## 2023-08-14 LAB — RENAL FUNCTION PANEL
Albumin: 2.6 g/dL — ABNORMAL LOW (ref 3.5–5.0)
Anion gap: 15 (ref 5–15)
BUN: 118 mg/dL — ABNORMAL HIGH (ref 6–20)
CO2: 21 mmol/L — ABNORMAL LOW (ref 22–32)
Calcium: 8.2 mg/dL — ABNORMAL LOW (ref 8.9–10.3)
Chloride: 95 mmol/L — ABNORMAL LOW (ref 98–111)
Creatinine, Ser: 2.88 mg/dL — ABNORMAL HIGH (ref 0.61–1.24)
GFR, Estimated: 24 mL/min — ABNORMAL LOW (ref >60)
Glucose, Bld: 178 mg/dL — ABNORMAL HIGH (ref 70–99)
Potassium: 5.5 mmol/L — ABNORMAL HIGH (ref 3.5–5.1)
Sodium: 131 mmol/L — ABNORMAL LOW (ref 135–145)

## 2023-08-14 LAB — HEMOGLOBIN A1C
Hgb A1c MFr Bld: 6 % — ABNORMAL HIGH (ref 4.8–5.6)
Mean Plasma Glucose: 125.5 mg/dL

## 2023-08-14 MED ORDER — SODIUM ZIRCONIUM CYCLOSILICATE 10 G PO PACK
10.0000 g | PACK | Freq: Two times a day (BID) | ORAL | Status: AC
  Administered 2023-08-14 (×2): 10 g
  Filled 2023-08-14 (×2): qty 1

## 2023-08-14 MED ORDER — OSMOLITE 1.5 CAL PO LIQD
355.0000 mL | Freq: Three times a day (TID) | ORAL | Status: DC
  Administered 2023-08-15 – 2023-08-19 (×14): 355 mL
  Filled 2023-08-14 (×8): qty 474

## 2023-08-14 MED ORDER — OSMOLITE 1.5 CAL PO LIQD
237.0000 mL | Freq: Three times a day (TID) | ORAL | Status: AC
  Administered 2023-08-14 – 2023-08-15 (×3): 237 mL
  Filled 2023-08-14 (×2): qty 237

## 2023-08-14 MED ORDER — MIDAZOLAM HCL 2 MG/2ML IJ SOLN
INTRAMUSCULAR | Status: AC | PRN
  Administered 2023-08-14 (×2): .5 mg via INTRAVENOUS

## 2023-08-14 MED ORDER — VALPROATE SODIUM 100 MG/ML IV SOLN
750.0000 mg | Freq: Two times a day (BID) | INTRAVENOUS | Status: DC
  Administered 2023-08-14 – 2023-09-17 (×62): 750 mg via INTRAVENOUS
  Filled 2023-08-14 (×71): qty 7.5

## 2023-08-14 MED ORDER — OSMOLITE 1.5 CAL PO LIQD
120.0000 mL | Freq: Three times a day (TID) | ORAL | Status: AC
  Administered 2023-08-14 (×2): 120 mL

## 2023-08-14 MED ORDER — LIDOCAINE-EPINEPHRINE 1 %-1:100000 IJ SOLN
INTRAMUSCULAR | Status: AC
  Filled 2023-08-14: qty 1

## 2023-08-14 MED ORDER — IOHEXOL 300 MG/ML  SOLN
50.0000 mL | Freq: Once | INTRAMUSCULAR | Status: AC | PRN
  Administered 2023-08-14: 20 mL

## 2023-08-14 MED ORDER — LIDOCAINE-EPINEPHRINE 1 %-1:100000 IJ SOLN
20.0000 mL | Freq: Once | INTRAMUSCULAR | Status: AC
  Administered 2023-08-14: 20 mL via INTRADERMAL
  Filled 2023-08-14: qty 20

## 2023-08-14 MED ORDER — FENTANYL CITRATE (PF) 100 MCG/2ML IJ SOLN
INTRAMUSCULAR | Status: AC | PRN
  Administered 2023-08-14 (×2): 25 ug via INTRAVENOUS

## 2023-08-14 MED ORDER — CEFAZOLIN SODIUM-DEXTROSE 2-4 GM/100ML-% IV SOLN
INTRAVENOUS | Status: AC
  Filled 2023-08-14: qty 100

## 2023-08-14 MED ORDER — HYDRALAZINE HCL 25 MG PO TABS: 25.0000 mg | ORAL_TABLET | Freq: Four times a day (QID) | ORAL | Status: DC | PRN

## 2023-08-14 MED ORDER — MIDAZOLAM HCL 2 MG/2ML IJ SOLN
INTRAMUSCULAR | Status: AC
  Filled 2023-08-14: qty 2

## 2023-08-14 MED ORDER — FENTANYL CITRATE (PF) 100 MCG/2ML IJ SOLN
INTRAMUSCULAR | Status: AC
  Filled 2023-08-14: qty 2

## 2023-08-14 NOTE — Care Management Important Message (Signed)
 Important Message  Patient Details  Name: Frank [name]. MRN: [id number] Date of Birth: 09/03/1965   Important Message Given:  Yes - Medicare IM     Claiborne Stroble 08/14/2023, 9:15 AM

## 2023-08-14 NOTE — Progress Notes (Signed)
 Frank [name] got his chemotherapy 2 days ago.  He tolerated this pretty well.  He still is on dialysis.  His BUN is 118 creatinine 2.88.  Calcium  8.2 with an albumin  of 2.6.  He apparently is n.p.o. today.  I am not sure there is going to have a procedure or not.  Yesterday, his white cell count was 8.8.  Hemoglobin 9.7.  Platelet count 252,000.  He has been getting some tube feeds.  I think certainly be going for a G-tube.  Of note, his last Kappa light chain was 26.9 mg/dL.  This is certainly somewhat concerning.  This level seems to be going up.  His monoclonal spike has been coming down.  Again I have suspect that he has 2 separate populations of myeloma cells.  We may have to think about another protocol for him.  We really need to try to get him on one of the monoclonal antibodies.  What he may have to do is switch out the Velcade  and think about using Kyprolis.  I think they still having some temperature spikes.  Yesterday, the Tmax was 100.9.  I think he does have the Aspergillus.  He is on voriconazole  for this.  He does seem to be a little bit more alert.  Again he has a trach so he cannot talk.  We will continue to make adjustments with his protocol in order to try to treat the myeloma.  [name], MD  Proverbs 3:5-6

## 2023-08-14 NOTE — Procedures (Signed)
 Interventional Radiology Procedure Note  Procedure: Placement of percutaneous 50F balloon-retention gastrostomy tube.  Complications: None  EBL:  < 5 mL  Recommendations: - NPO for 4 hours after placement - Maintain G-tube to low wall suction for 4 hours after placement - May advance diet as tolerated and begin using tube 4 hours after placement - Gastropexy sutures are absorbable and do not require removal  Marliss Coots, MD Pager: [phone]

## 2023-08-14 NOTE — Plan of Care (Signed)
 Placed on NPO, with an episode of elevated temperature, prn meds given, no other significant changes happen as of this time. Problem: Health Behavior/Discharge Planning: Goal: Ability to manage health-related needs will improve Outcome: Progressing   Problem: Clinical Measurements: Goal: Will remain free from infection Outcome: Progressing   Problem: Clinical Measurements: Goal: Diagnostic test results will improve Outcome: Progressing   Problem: Activity: Goal: Risk for activity intolerance will decrease Outcome: Progressing   Problem: Nutrition: Goal: Adequate nutrition will be maintained Outcome: Progressing   Problem: Elimination: Goal: Will not experience complications related to bowel motility Outcome: Progressing   Problem: Safety: Goal: Ability to remain free from injury will improve Outcome: Progressing   Problem: Skin Integrity: Goal: Risk for impaired skin integrity will decrease Outcome: Progressing   Problem: Skin Integrity: Goal: Risk for impaired skin integrity will decrease Outcome: Progressing   Problem: Activity: Goal: Ability to tolerate increased activity will improve Outcome: Progressing

## 2023-08-14 NOTE — Plan of Care (Signed)
 Palliative:  Discussed with Dr. Gonfa. Goals have remained clear. Patient is awake although not verbal but nods in agreement for continued full scope treatment. Palliative will sign off. Feel free to re-consult with any changes in status and need to readdress goals of care.   No charge  Vila Grayer, NP Palliative Medicine Team Pager [phone] (Please see [email or web address] for schedule) Team Phone [phone]

## 2023-08-14 NOTE — Progress Notes (Signed)
 Speech Language Pathology Treatment: Noelia Batman Speaking valve  Patient Details Name: Frank [name]. MRN: [id number] DOB: 05/06/1965 Today's Date: 08/14/2023 Time: 4696-2952 SLP Time Calculation (min) (ACUTE ONLY): 13 min  Assessment / Plan / Recommendation Clinical Impression  Pt alert but confused. SLP completed oral care due to thick secretions clinging to teeth and lips. Placed PMSV which pt was able to wear for 10 minutes without coughing or any change in vital signs. Pt able to sustain phonation with a cue x1. Vocal quality breathy but improved from prior sessions. SLP attempted to shape phonation into speech, but pt would not articulate his name, or family member's name when shown a photo. Could not elicits any speech or even phonatory attempts later in session. Pt would wear PMSV now with full staff or therapy supervision which would given him more opportunities to talk. Pt would benefit from change to cuffless trach to prepare for ultimate d/c and increase airflow to upper airway. WIll f/u, suspect surgery today making mentation a bit worse.   HPI HPI: 58 y.o. male transferred from [facility] 07/03/23 to [facility] for management of newly diagnosed plasma cell myeloma with weakness and AMS. Pt also with AKI and metabolic encephalopathy. 5/10 transfer to [facility] for iHD.Bedside swallow eval 4/18 wiht recs for NPO due to somnolence. 4/22 Intubated and chemo initiated. 4/23-5/10 CRRT. 5/6 IVIG started. 5/8 trach. 5/12 iHD initiated. 5/18 inability to oxygenate through trach, intubated and trach revision. PMHx:Lt THA, carpal tunnel syndrome, Lt TKA, Lt humerus fx, PAF, HTN, HLD, bipolar disorder, obesity, T2DM      SLP Plan  Continue with current plan of care      Recommendations for follow up therapy are one component of a multi-disciplinary discharge planning process, led by the attending physician.  Recommendations may be updated based on patient status, additional functional criteria and  insurance authorization.    Recommendations         Patient may use Passy-Muir Speech Valve: Intermittently with supervision;During all therapies with supervision PMSV Supervision: Full MD: Please consider changing trach tube to : Cuffless           Oral care QID           Continue with current plan of care     Shelva Hetzer, Hardin Leys  08/14/2023, 1:39 PM

## 2023-08-14 NOTE — Progress Notes (Signed)
 PROGRESS NOTE  Frank [name]. ZOX:[id number] DOB: [birth year]   PCP: Serita Danes, MD  Patient is from: Home  DOA: 07/03/2023 LOS: 42  Chief complaints No chief complaint on file.    Brief Narrative / Interim history: 58 year old M with PMH of OSA on CPAP, pathological fracture and multiple ORIF left humerus likely due to multiple myeloma, CKD-3A, A-fib not on AC, asthma, HTN, HLD and tobacco use disorder who had been feeling weak for about 1 week, admitted to [facility] with concern for sepsis, community-acquired pneumonia and acute encephalopathy.  Patient was found to have lytic lesions, he was transferred to [facility] for oncology treatment.  In the meantime, he developed encephalopathy, acute renal failure and remained very sick.  He remained in the ICU for 36 days.  Underwent tracheostomy. Deemed to be not stable for PEG tube placement.  He has been on tube feeds via cortrack.  Also started on dialysis after CRRT.   Significant events 4/17-admitted to medical floor with oncology consultation to treat multiple myeloma. 4/19-transferred to ICU intubated, encephalopathic. 4/20-A-fib with RVR.  Started heparin . Groin and biopsy site hematoma requiring multiple transfusions EEG 4/25 >> moderate to severe encephalopathy without any seizures or epileptiform discharges 4/29 Received Velcade  and Cytoxan  chemotherapy-intent palliative? 5/2 LP and ETT exchange  5/8 Trach placed. Some post op bleeding improved with thrombi-pad 5/10 voriconazole  added back based on CT Chest findings 5/13 more alert and able to follow some commands. 5/23, transferred to floor. 5/29-PEG placed  Subjective: Seen and examined earlier this morning after he returned from PEG tube placement.  He is sleepy likely from the fentanyl  and Versed  he received during procedure.  Per RN, he was trying to pull his NG tube earlier.   Objective: Vitals:   08/14/23 0815 08/14/23 0820 08/14/23 0825  08/14/23 0847  BP: (!) 133/95 (!) 140/100 (!) 135/98 (!) 133/99  Pulse: (!) 109 (!) 111 (!) 109 (!) 108  Resp: 13 (!) 23 16 (!) 23  Temp:    99.4 F (37.4 C)  TempSrc:    Axillary  SpO2: 97% 95%  99%  Weight:      Height:        Examination:  GENERAL: No apparent distress.  Nontoxic. HEENT: MMM.  Vision and hearing grossly intact.  Cortrack. NECK: Tracheostomy in place. RESP:  No IWOB.  Rhonchi bilaterally. CVS: HR in 100s.  Heart sounds normal.  ABD/GI/GU: BS+. Abd soft, NTND.  G-tube and rectal tube in place. MSK/EXT: Moves right arm.  Left arm in brace.  Minimally moves BLE.  No edema.  NEURO: Sleepy.  Limited exam due to mental status. PSYCH: Calm.  No distress or agitation.  Consultants:  Neurology Nephrology Oncology Interventional radiology Infectious disease Palliative   Microbiology summarized: 4/28 BCx > no growth, final 4/30 respiratory culture-few PMN, rare budding yeast, abundant GPC> few aspergillus fumigatus 5/2-Quantiferon  neg 5/4-trach asp  rare GPC, rare mold  5/5-PCP smear trach asp neg  5/9-trach asp abundant staph hemolyticus, few budding yeast 5/11-blood cultures negative 5/15-trach aspirate negative   CSF studies 1 RBC, 1 WBC, glucose 106 CSF culture-2 organisms acute, rare WBC> NGTD Cryptococcal antigen negative meningitis and encephalitis panel negative VDRL  non-reactive   Assessment and plan: Acute hypoxic respiratory failure and respiratory insufficiency status post tracheostomy:  -On trach collar. -Continue chest physiotherapy -Pulmonology following intermittently.  Not a candidate for decannulation yet   Severe acute metabolic encephalopathy with hypoactive delirium, seizure disorder: Normal MRI  brain.  LP negative for meningitis or encephalitis.  EEG negative for seizure but encephalopathy.  Home Depakote  discontinued in ICU but resume later.  BUN elevated to 110s likely from tube feed.  Tremor and agitation reportedly improved  with Klonopin .  Mental status fluctuates.  Currently sleeping likely from the fentanyl  and Versed  he received for G-tube. -Reorientation and delirium precaution -Continue low-dose Klonopin  -Depacon  resumed on 5/25   Aspergillosis with pneumonia:  -On voriconazole  per ID.  ID to decide duration.  -Also on acyclovir.  NIDDM-2 with hyperglycemia: A1c 6.7% in 01/2023 Recent Labs  Lab 08/13/23 1601 08/13/23 2027 08/13/23 2354 08/14/23 0352 08/14/23 0845  GLUCAP 246* 288* 221* 184* 194*  -Increased Semglee  from 8 to 10 units twice daily on 5/28 -Continue NovoLog  4 units every 4 hours -Continue SSI-resident every 4 hours -Recheck hemoglobin A1c  Multiple myeloma: Started on Velcade  and Cytoxan .  Palliative intent?  Oncology following.  AKI on CKD-3A/hyponatremia/hyperkalemia: Due to multiple myeloma, hypercalcemia and ACE inhibitors? -S/p CRRT while in ICU.   -Tunneled HD placed on 5/22.  Now on HD -Lokelma 10 g twice daily x 2 doses. -Nephrology managing   Paroxysmal A-fib: Now in sinus rhythm.  Rate controlled without meds.   Unable to anticoagulate due to hematoma and intolerance.   Obstructive sleep apnea: Now with trach collar.  Recurrent fever: Evaluated by ID.  Felt to be due to malignancy. - Symptomatic management  Anxiety/bipolar disorder - Continue home Paxil  - Continue Depakote  - Low-dose Klonopin    Goal of care: Poor long-term prognosis.  Palliative from prior providers had discussion with patient's wife who is an EMT.  Remains full code with full scope of care.   Severe malnutrition Body mass index is 27.38 kg/m. Nutrition Problem: Severe Malnutrition Etiology: acute illness (prolonged illness, interuptions in feeding) Signs/Symptoms: moderate fat depletion, moderate muscle depletion, percent weight loss (9.6% x 1 month) Percent weight loss: 9.6 % Interventions: Refer to RD note for recommendations, Tube feeding  Pressure skin injury: Pressure Injury  07/05/23 Heel Left;Right Unstageable - Full thickness tissue loss in which the base of the injury is covered by slough (yellow, tan, gray, green or brown) and/or eschar (tan, brown or black) in the wound bed. (Active)  07/05/23 1108  Location: Heel  Location Orientation: Left;Right  Staging: Unstageable - Full thickness tissue loss in which the base of the injury is covered by slough (yellow, tan, gray, green or brown) and/or eschar (tan, brown or black) in the wound bed.  Wound Description (Comments):   Present on Admission: Yes  Dressing Type Foam - Lift dressing to assess site every shift 08/13/23 2000     Pressure Injury 07/11/23 Buttocks Mid Unstageable - Full thickness tissue loss in which the base of the injury is covered by slough (yellow, tan, gray, green or brown) and/or eschar (tan, brown or black) in the wound bed. 8 cm x 6 cmx 0.1 cm (Active)  07/11/23 1600  Location: Buttocks  Location Orientation: Mid  Staging: Unstageable - Full thickness tissue loss in which the base of the injury is covered by slough (yellow, tan, gray, green or brown) and/or eschar (tan, brown or black) in the wound bed.  Wound Description (Comments): 8 cm x 6 cmx 0.1 cm  Present on Admission: No  Dressing Type Foam - Lift dressing to assess site every shift 08/13/23 2000     Pressure Injury 07/27/23 Buttocks Right;Lower (Active)  07/27/23 1700  Location: Buttocks  Location Orientation: Right;Lower  Staging:  Wound Description (Comments):   Present on Admission: Yes  Dressing Type Foam - Lift dressing to assess site every shift 08/13/23 2000   DVT prophylaxis:  heparin  injection 5,000 Units Start: 08/14/23 2200 Place and maintain sequential compression device Start: 07/14/23 1033  Code Status: Full code Family Communication: None at bedside. Level of care: Progressive Status is: Inpatient Remains inpatient appropriate because: Respiratory failure, encephalopathy, renal failure   Final  disposition: LTAC   35 minutes with more than 50% spent in reviewing records, counseling patient/family and coordinating care.   Sch Meds:  Scheduled Meds:  acyclovir  200 mg Per Tube BID   arformoterol   15 mcg Nebulization BID   collagenase   Topical Daily   feeding supplement (OSMOLITE 1.5 CAL)  120 mL Per Tube TID PC & HS   feeding supplement (OSMOLITE 1.5 CAL)  237 mL Per Tube TID PC & HS   [START ON 08/15/2023] feeding supplement (OSMOLITE 1.5 CAL)  355 mL Per Tube TID PC & HS   feeding supplement (PROSource TF20)  60 mL Per Tube BID   fiber  1 packet Per Tube BID   folic acid   1 mg Per Tube Daily   heparin  injection (subcutaneous)  5,000 Units Subcutaneous Q8H   insulin  aspart  0-20 Units Subcutaneous Q4H   insulin  aspart  4 Units Subcutaneous Q4H   insulin  glargine-yfgn  10 Units Subcutaneous BID   multivitamin  1 tablet Per Tube QHS   nicotine   7 mg Transdermal Daily   nutrition supplement (JUVEN)  1 packet Per Tube BID BM   mouth rinse  15 mL Mouth Rinse 4 times per day   PARoxetine   40 mg Per Tube Daily   pneumococcal 20-valent conjugate vaccine  0.5 mL Intramuscular Tomorrow-1000   revefenacin   175 mcg Nebulization Daily   sodium chloride  flush  10-40 mL Intracatheter Q12H   sodium chloride  flush  10-40 mL Intracatheter Q12H   sodium zirconium cyclosilicate  10 g Per Tube BID   thiamine   100 mg Per Tube Daily   voriconazole   350 mg Per Tube Q12H   Continuous Infusions:  valproate sodium      PRN Meds:.acetaminophen  (TYLENOL ) oral liquid 160 mg/5 mL **OR** acetaminophen , clonazePAM , iohexol , ipratropium-albuterol , [START ON 08/15/2023] ondansetron  (ZOFRAN ) IV, mouth rinse, oxyCODONE , polyvinyl alcohol , sodium chloride  flush, sodium chloride  flush  Antimicrobials: Anti-infectives (From admission, onward)    Start     Dose/Rate Route Frequency Ordered Stop   08/14/23 0600  ceFAZolin  (ANCEF ) IVPB 2g/100 mL premix        2 g 200 mL/hr over 30 Minutes Intravenous To  Radiology 08/13/23 1055 08/14/23 0606   08/09/23 1000  acyclovir (ZOVIRAX) 200 MG capsule 200 mg       Note to Pharmacy: Give after dialysis the pm dose, on dialysis days   200 mg Per Tube 2 times daily 08/08/23 2358     08/09/23 0100  acyclovir (ZOVIRAX) 200 MG capsule 200 mg       Note to Pharmacy: Give after dialysis the pm dose, on dialysis days   200 mg Per Tube  Once 08/09/23 0009 08/09/23 0013   08/08/23 2345  acyclovir (ZOVIRAX) 200 MG capsule 200 mg  Status:  Discontinued       Note to Pharmacy: Give after dialysis the pm dose, on dialysis days   200 mg Oral 2 times daily 08/08/23 2257 08/08/23 2358   08/07/23 1530  ceFAZolin  (ANCEF ) IVPB 2g/100 mL premix  over 30 Minutes Intravenous Continuous PRN 08/07/23 1555 08/07/23 1530   08/05/23 2200  voriconazole  (VFEND ) tablet 350 mg        350 mg Per Tube Every 12 hours 08/05/23 1511     08/01/23 1200  vancomycin  (VANCOCIN ) IVPB 1000 mg/200 mL premix        1,000 mg 200 mL/hr over 60 Minutes Intravenous Every M-W-F (Hemodialysis) 07/31/23 0649 08/01/23 1652   07/30/23 1200  vancomycin  (VANCOCIN ) IVPB 1000 mg/200 mL premix        1,000 mg 200 mL/hr over 60 Minutes Intravenous Every M-W-F (Hemodialysis) 07/30/23 0851 07/30/23 1241   07/29/23 0900  vancomycin  (VANCOCIN ) IVPB 1000 mg/200 mL premix        1,000 mg 200 mL/hr over 60 Minutes Intravenous  Once 07/29/23 0812 07/29/23 0943   07/28/23 1204  vancomycin  variable dose per unstable renal function (pharmacist dosing)  Status:  Discontinued         Does not apply See admin instructions 07/28/23 1204 07/31/23 0649   07/28/23 1000  voriconazole  (VFEND ) 350 mg in sodium chloride  0.9 % 100 mL IVPB  Status:  Discontinued       Placed in "Followed by" Linked Group   4 mg/kg  88.6 kg (Adjusted) 67.5 mL/hr over 120 Minutes Intravenous Every 12 hours 07/28/23 0748 08/05/23 1511   07/27/23 1000  voriconazole  (VFEND ) 400 mg in sodium chloride  0.9 % 100 mL IVPB  Status:  Discontinued        Placed in "Followed by" Linked Group   4 mg/kg  100.6 kg 70 mL/hr over 120 Minutes Intravenous Every 12 hours 07/26/23 0826 07/28/23 0748   07/27/23 0945  vancomycin  (VANCOREADY) IVPB 2000 mg/400 mL        2,000 mg 200 mL/hr over 120 Minutes Intravenous  Once 07/27/23 0849 07/27/23 1237   07/26/23 1515  Ampicillin -Sulbactam (UNASYN ) 3 g in sodium chloride  0.9 % 100 mL IVPB  Status:  Discontinued        3 g 200 mL/hr over 30 Minutes Intravenous Every 8 hours 07/26/23 1418 07/27/23 0830   07/26/23 0915  voriconazole  (VFEND ) 600 mg in sodium chloride  0.9 % 150 mL IVPB       Placed in "Followed by" Linked Group   6 mg/kg  100.6 kg 105 mL/hr over 120 Minutes Intravenous Every 12 hours 07/26/23 0826 07/27/23 0048   07/26/23 0900  Ampicillin -Sulbactam (UNASYN ) 3 g in sodium chloride  0.9 % 100 mL IVPB  Status:  Discontinued        3 g 200 mL/hr over 30 Minutes Intravenous Every 12 hours 07/26/23 0826 07/26/23 1418   07/23/23 1000  levofloxacin  (LEVAQUIN ) IVPB 500 mg  Status:  Discontinued       Placed in "Followed by" Linked Group   500 mg 100 mL/hr over 60 Minutes Intravenous Every 24 hours 07/22/23 1213 07/23/23 1001   07/23/23 1000  levofloxacin  (LEVAQUIN ) IVPB 500 mg  Status:  Discontinued       Placed in "Followed by" Linked Group   500 mg 100 mL/hr over 60 Minutes Intravenous Every 24 hours 07/23/23 1001 07/28/23 0804   07/22/23 1300  levofloxacin  (LEVAQUIN ) IVPB 750 mg       Placed in "Followed by" Linked Group   750 mg 100 mL/hr over 90 Minutes Intravenous  Once 07/22/23 1213 07/22/23 1542   07/21/23 1600  voriconazole  (VFEND ) 300 mg in sodium chloride  0.9 % 100 mL IVPB  Status:  Discontinued  300 mg 65 mL/hr over 120 Minutes Intravenous Every 12 hours 07/21/23 1430 07/22/23 1415   07/21/23 1000  voriconazole  (VFEND ) tablet 200 mg  Status:  Discontinued        200 mg Oral Every 12 hours 07/20/23 0944 07/21/23 1015   07/20/23 1030  voriconazole  (VFEND ) 530 mg in sodium  chloride 0.9 % 150 mL IVPB        6 mg/kg  88.6 kg (Adjusted) 101.5 mL/hr over 120 Minutes Intravenous Every 12 hours 07/20/23 0944 07/21/23 0030   07/19/23 1400  meropenem  (MERREM ) 1 g in sodium chloride  0.9 % 100 mL IVPB  Status:  Discontinued        1 g 200 mL/hr over 30 Minutes Intravenous Every 8 hours 07/19/23 1143 07/22/23 1213   07/18/23 0600  meropenem  (MERREM ) 1 g in sodium chloride  0.9 % 100 mL IVPB  Status:  Discontinued        1 g 200 mL/hr over 30 Minutes Intravenous Every 24 hours 07/17/23 0916 07/19/23 1143   07/17/23 0700  meropenem  (MERREM ) 1 g in sodium chloride  0.9 % 100 mL IVPB  Status:  Discontinued        1 g 200 mL/hr over 30 Minutes Intravenous Every 12 hours 07/17/23 0601 07/17/23 0916   07/15/23 1300  vancomycin  (VANCOCIN ) IVPB 1000 mg/200 mL premix  Status:  Discontinued        1,000 mg 200 mL/hr over 60 Minutes Intravenous Every 24 hours 07/14/23 1129 07/22/23 1319   07/14/23 1215  vancomycin  (VANCOREADY) IVPB 1750 mg/350 mL        1,750 mg 175 mL/hr over 120 Minutes Intravenous  Once 07/14/23 1129 07/14/23 1449   07/09/23 1800  piperacillin -tazobactam (ZOSYN ) IVPB 3.375 g  Status:  Discontinued        3.375 g 12.5 mL/hr over 240 Minutes Intravenous Every 12 hours 07/09/23 0756 07/09/23 0844   07/09/23 1400  piperacillin -tazobactam (ZOSYN ) IVPB 3.375 g  Status:  Discontinued        3.375 g 12.5 mL/hr over 240 Minutes Intravenous Every 8 hours 07/09/23 0844 07/17/23 0557   07/08/23 1300  piperacillin -tazobactam (ZOSYN ) IVPB 3.375 g  Status:  Discontinued        3.375 g 12.5 mL/hr over 240 Minutes Intravenous Every 8 hours 07/08/23 1129 07/09/23 0756   07/05/23 2200  azithromycin  (ZITHROMAX ) 500 mg in sodium chloride  0.9 % 250 mL IVPB        500 mg 250 mL/hr over 60 Minutes Intravenous Daily at bedtime 07/05/23 2022 07/06/23 2302   07/05/23 2115  azithromycin  (ZITHROMAX ) 250 mg in dextrose  5 % 125 mL IVPB  Status:  Discontinued       Note to Pharmacy: Missed  dose this morning   250 mg 127.5 mL/hr over 60 Minutes Intravenous Every 24 hours 07/05/23 2016 07/05/23 2021   07/04/23 0900  fluconazole  (DIFLUCAN ) IVPB 200 mg  Status:  Discontinued        200 mg 100 mL/hr over 60 Minutes Intravenous Every 48 hours 07/04/23 0722 07/09/23 0809   07/03/23 0600  cefTRIAXone  (ROCEPHIN ) 1 g in sodium chloride  0.9 % 100 mL IVPB  Status:  Discontinued        1 g 200 mL/hr over 30 Minutes Intravenous Every 24 hours 07/03/23 0508 07/08/23 1104   07/03/23 0600  azithromycin  (ZITHROMAX ) tablet 250 mg  Status:  Discontinued        250 mg Oral Daily 07/03/23 0508 07/05/23 2016  I have personally reviewed the following labs and images: CBC: Recent Labs  Lab 08/09/23 0719 08/10/23 0711 08/11/23 1059 08/12/23 0525 08/13/23 0531  WBC 8.7 9.1 6.8 8.3 8.8  NEUTROABS 5.7 7.5 5.5 6.9 7.1  HGB 10.1* 10.4* 11.5* 10.9* 9.7*  HCT 33.4* 33.2* 37.9* 34.3* 31.1*  MCV 104.4* 100.0 102.4* 99.4 101.3*  PLT 284 299 287 259 252   BMP &GFR Recent Labs  Lab 08/08/23 0450 08/09/23 0719 08/10/23 0711 08/11/23 1059 08/12/23 0525 08/13/23 0531 08/14/23 0436  NA 132*  131* 137 134* 137  137 132* 131* 131*  K 3.4*  3.4* 4.7 4.4 4.8  4.9 4.2 5.2* 5.5*  CL 96*  96* 100 96* 99  99 97* 100 95*  CO2 25  25 21* 24 21*  20* 23 20* 21*  GLUCOSE 158*  158* 211* 201* 213*  218* 185* 169* 178*  BUN 101*  103* 165* 121* 166*  170* 117* 170* 118*  CREATININE 2.83*  2.86* 4.10* 3.28* 3.60*  3.61* 2.64* 3.50* 2.88*  CALCIUM  7.7*  7.6* 7.6* 7.9* 8.0*  8.2* 8.7* 7.9* 8.2*  MG 2.5* 2.6* 2.3 2.7*  --   --   --   PHOS RESULTS UNAVAILABLE DUE TO INTERFERING SUBSTANCE 4.0 NOT CALCULATED 6.8*  --  RESULTS UNAVAILABLE DUE TO INTERFERING SUBSTANCE RESULTS UNAVAILABLE DUE TO INTERFERING SUBSTANCE   Estimated Creatinine Clearance: 30.3 mL/min (A) (by C-G formula based on SCr of 2.88 mg/dL (H)). Liver & Pancreas: Recent Labs  Lab 08/08/23 0450 08/09/23 0719  08/10/23 0711 08/11/23 1059 08/12/23 0525 08/13/23 0531 08/14/23 0436  AST 15 15 16  14* 13*  --   --   ALT 10 7 12 10 11   --   --   ALKPHOS 58 47 46 45 43  --   --   BILITOT 0.5 1.0 1.1 0.8 0.9  --   --   PROT 8.8* 8.1 8.7* 8.2* 8.5*  --   --   ALBUMIN  2.2*  2.2* 2.2* 2.4* 2.3*  2.3* 2.4* 2.2* 2.6*   No results for input(s): "LIPASE", "AMYLASE" in the last 168 hours. Recent Labs  Lab 08/12/23 2124  AMMONIA 14   Diabetic: No results for input(s): "HGBA1C" in the last 72 hours. Recent Labs  Lab 08/13/23 1601 08/13/23 2027 08/13/23 2354 08/14/23 0352 08/14/23 0845  GLUCAP 246* 288* 221* 184* 194*   Cardiac Enzymes: No results for input(s): "CKTOTAL", "CKMB", "CKMBINDEX", "TROPONINI" in the last 168 hours. No results for input(s): "PROBNP" in the last 8760 hours. Coagulation Profile: Recent Labs  Lab 08/13/23 0908  INR 1.0   Thyroid Function Tests: No results for input(s): "TSH", "T4TOTAL", "FREET4", "T3FREE", "THYROIDAB" in the last 72 hours. Lipid Profile: No results for input(s): "CHOL", "HDL", "LDLCALC", "TRIG", "CHOLHDL", "LDLDIRECT" in the last 72 hours. Anemia Panel: No results for input(s): "VITAMINB12", "FOLATE", "FERRITIN", "TIBC", "IRON", "RETICCTPCT" in the last 72 hours. Urine analysis:    Component Value Date/Time   COLORURINE STRAW (A) 07/05/2023 1912   APPEARANCEUR CLEAR 07/05/2023 1912   LABSPEC 1.008 07/05/2023 1912   PHURINE 7.0 07/05/2023 1912   GLUCOSEU NEGATIVE 07/05/2023 1912   HGBUR MODERATE (A) 07/05/2023 1912   BILIRUBINUR NEGATIVE 07/05/2023 1912   KETONESUR NEGATIVE 07/05/2023 1912   PROTEINUR 30 (A) 07/05/2023 1912   NITRITE NEGATIVE 07/05/2023 1912   LEUKOCYTESUR NEGATIVE 07/05/2023 1912   Sepsis Labs: Invalid input(s): "PROCALCITONIN", "LACTICIDVEN"  Microbiology: No results found for this or any previous visit (from the past 240 hours).  Radiology Studies: DG Abd 1 View Result Date: 08/14/2023 CLINICAL DATA:  Pre  gastrostomy evaluation EXAM: ABDOMEN - 1 VIEW COMPARISON:  None Available. FINDINGS: Scattered large and small bowel gas is noted. Feeding catheter is noted within the distal stomach. Dialysis catheter is noted with the tip in the right atrium. No free air seen. No obstructive changes are noted. Prior left hip replacement is seen. IMPRESSION: No acute abnormality noted.  Feeding catheter in the distal stomach. Electronically Signed   By: Violeta Grey M.D.   On: 08/14/2023 09:27      Ramzi Brathwaite T. Taijuan Serviss [facility]  If 7PM-7AM, please contact night-coverage [email or web address] 08/14/2023, 11:34 AM

## 2023-08-14 NOTE — Progress Notes (Signed)
 Brief Nutrition Note  PEG is in place with start time of use at 12:30 PM. Adjust TF orders to bolus. Discussed with RN.  Once PEG is cleared for use this afternoon, begin the following: 6 cartons of Osmolite 1.5 per day (1.5 cartons x 4 feeds per day) Will ensure there is a 2 hour break for voriconazole  administration BID Start with 1/2 a carton and increase by 1/2 a carton every 2-3 feeds if tolerated to goal. Prosource TF20 60mL 2x/d This will provide 2290 kcal, 129g of protein, and of free water  TF+juven = 2480kcal, 134g protein  Frank [name], RD, LDN Registered Dietitian II Please reach out via secure chat

## 2023-08-14 NOTE — Progress Notes (Signed)
 Frank [name] Progress Note   Assessment/ Plan:   # Oliguric AKI on CKD 3a - b/l creat 1.1- 1.5 from 2024. Creat here 5.8 on presentation. AKI due to myeloma kidney +/- hyperCa +/- ACEi. CRRT from 4/23--- 5/10.  on iHD now.   dialysis 5/22 following placement  tunneled HD catheter, also on 5/24-  - HD  5/26 d/t catabolic state - next HD Wednesday.  Hoping to stay on MWF schedule to optimize chemo schedule - increased time to 3.75 hrs d/t sig azotemia - agitated yesterday, had to change system---> hopefully this will improve tomorrow  # AMS/acute metabolic encephalopathy: MRI, LP completed and unrevealing.  Waxes and wanes    #Volume -   Gentle UF moving forward   # Hypotension: normotensive,    # Multiple myeloma: started on Velcade  and Cytoxan ,  Per onc   #AHRF/ aspergillus PNA/ s/p trach 5/8: on voriconazole  IV, off all other IV antibiotics. Off the ventilator on trach collar, per CCM   # Atrial fib-  on amio   #Anemia - prn transfusions. On epogen  per onc.    # Persistent fevers with negative infectious workup thought to be related to malignancy  Subjective:   Seen in room.  Sleeping.  HD yesterday, 3L off.     Objective:   BP (!) 135/100 (BP Location: Left Arm)   Pulse (!) 109   Temp 99.6 F (37.6 C) (Axillary)   Resp 13   Ht 6' 0.01" (1.829 m)   Wt 91.6 kg   SpO2 95%   BMI 27.38 kg/m   Intake/Output Summary (Last 24 hours) at 08/14/2023 1222 Last data filed at 08/14/2023 0981 Gross per 24 hour  Intake --  Output 3000 ml  Net -3000 ml   Weight change: -4.9 kg  Physical Exam: Gen: NAD CVS: RRR Resp: on TC Abd: soft Ext: no LE edema ACCESS: [facility]  Imaging: DG Abd 1 View Result Date: 08/14/2023 CLINICAL DATA:  Pre gastrostomy evaluation EXAM: ABDOMEN - 1 VIEW COMPARISON:  None Available. FINDINGS: Scattered large and small bowel gas is noted. Feeding catheter is noted within the distal stomach. Dialysis catheter is noted with the tip in the right  atrium. No free air seen. No obstructive changes are noted. Prior left hip replacement is seen. IMPRESSION: No acute abnormality noted.  Feeding catheter in the distal stomach. Electronically Signed   By: Violeta Grey M.D.   On: 08/14/2023 09:27     Labs: BMET Recent Labs  Lab 08/08/23 0450 08/09/23 0719 08/10/23 0711 08/11/23 1059 08/12/23 0525 08/13/23 0531 08/14/23 0436  NA 132*  131* 137 134* 137  137 132* 131* 131*  K 3.4*  3.4* 4.7 4.4 4.8  4.9 4.2 5.2* 5.5*  CL 96*  96* 100 96* 99  99 97* 100 95*  CO2 25  25 21* 24 21*  20* 23 20* 21*  GLUCOSE 158*  158* 211* 201* 213*  218* 185* 169* 178*  BUN 101*  103* 165* 121* 166*  170* 117* 170* 118*  CREATININE 2.83*  2.86* 4.10* 3.28* 3.60*  3.61* 2.64* 3.50* 2.88*  CALCIUM  7.7*  7.6* 7.6* 7.9* 8.0*  8.2* 8.7* 7.9* 8.2*  PHOS RESULTS UNAVAILABLE DUE TO INTERFERING SUBSTANCE 4.0 NOT CALCULATED 6.8*  --  RESULTS UNAVAILABLE DUE TO INTERFERING SUBSTANCE RESULTS UNAVAILABLE DUE TO INTERFERING SUBSTANCE   CBC Recent Labs  Lab 08/11/23 1059 08/12/23 0525 08/13/23 0531 08/14/23 1043  WBC 6.8 8.3 8.8 8.4  NEUTROABS 5.5 6.9  7.1 PENDING  HGB 11.5* 10.9* 9.7* 10.7*  HCT 37.9* 34.3* 31.1* 33.8*  MCV 102.4* 99.4 101.3* 99.7  PLT 287 259 252 207    Medications:     acyclovir  200 mg Per Tube BID   arformoterol   15 mcg Nebulization BID   collagenase   Topical Daily   feeding supplement (OSMOLITE 1.5 CAL)  120 mL Per Tube TID PC & HS   feeding supplement (OSMOLITE 1.5 CAL)  237 mL Per Tube TID PC & HS   [START ON 08/15/2023] feeding supplement (OSMOLITE 1.5 CAL)  355 mL Per Tube TID PC & HS   feeding supplement (PROSource TF20)  60 mL Per Tube BID   fiber  1 packet Per Tube BID   folic acid   1 mg Per Tube Daily   heparin  injection (subcutaneous)  5,000 Units Subcutaneous Q8H   insulin  aspart  0-20 Units Subcutaneous Q4H   insulin  aspart  4 Units Subcutaneous Q4H   insulin  glargine-yfgn  10 Units Subcutaneous BID    multivitamin  1 tablet Per Tube QHS   nicotine   7 mg Transdermal Daily   nutrition supplement (JUVEN)  1 packet Per Tube BID BM   mouth rinse  15 mL Mouth Rinse 4 times per day   PARoxetine   40 mg Per Tube Daily   pneumococcal 20-valent conjugate vaccine  0.5 mL Intramuscular Tomorrow-1000   revefenacin   175 mcg Nebulization Daily   sodium chloride  flush  10-40 mL Intracatheter Q12H   sodium chloride  flush  10-40 mL Intracatheter Q12H   sodium zirconium cyclosilicate  10 g Per Tube BID   thiamine   100 mg Per Tube Daily   voriconazole   350 mg Per Tube Q12H    Leandra Pro, MD 08/14/2023, 12:22 PM

## 2023-08-15 DIAGNOSIS — N179 Acute kidney failure, unspecified: Secondary | ICD-10-CM | POA: Diagnosis not present

## 2023-08-15 DIAGNOSIS — C9000 Multiple myeloma not having achieved remission: Secondary | ICD-10-CM | POA: Diagnosis not present

## 2023-08-15 DIAGNOSIS — R531 Weakness: Secondary | ICD-10-CM | POA: Diagnosis not present

## 2023-08-15 DIAGNOSIS — B441 Other pulmonary aspergillosis: Secondary | ICD-10-CM | POA: Diagnosis not present

## 2023-08-15 DIAGNOSIS — G9341 Metabolic encephalopathy: Secondary | ICD-10-CM | POA: Diagnosis not present

## 2023-08-15 DIAGNOSIS — R509 Fever, unspecified: Secondary | ICD-10-CM | POA: Diagnosis not present

## 2023-08-15 DIAGNOSIS — J9601 Acute respiratory failure with hypoxia: Secondary | ICD-10-CM | POA: Diagnosis not present

## 2023-08-15 LAB — RENAL FUNCTION PANEL
Albumin: 2.4 g/dL — ABNORMAL LOW (ref 3.5–5.0)
Anion gap: 18 — ABNORMAL HIGH (ref 5–15)
BUN: 166 mg/dL — ABNORMAL HIGH (ref 6–20)
CO2: 21 mmol/L — ABNORMAL LOW (ref 22–32)
Calcium: 8.3 mg/dL — ABNORMAL LOW (ref 8.9–10.3)
Chloride: 95 mmol/L — ABNORMAL LOW (ref 98–111)
Creatinine, Ser: 4.13 mg/dL — ABNORMAL HIGH (ref 0.61–1.24)
GFR, Estimated: 16 mL/min — ABNORMAL LOW (ref >60)
Glucose, Bld: 120 mg/dL — ABNORMAL HIGH (ref 70–99)
Phosphorus: 7.5 mg/dL — ABNORMAL HIGH (ref 2.5–4.6)
Potassium: 5.1 mmol/L (ref 3.5–5.1)
Sodium: 134 mmol/L — ABNORMAL LOW (ref 135–145)

## 2023-08-15 LAB — CBC WITH DIFFERENTIAL/PLATELET
Abs Immature Granulocytes: 0.05 10*3/uL (ref 0.00–0.07)
Basophils Absolute: 0 10*3/uL (ref 0.0–0.1)
Basophils Relative: 0 %
Eosinophils Absolute: 0 10*3/uL (ref 0.0–0.5)
Eosinophils Relative: 0 %
HCT: 32.1 % — ABNORMAL LOW (ref 39.0–52.0)
Hemoglobin: 10.3 g/dL — ABNORMAL LOW (ref 13.0–17.0)
Immature Granulocytes: 1 %
Lymphocytes Relative: 15 %
Lymphs Abs: 1.2 10*3/uL (ref 0.7–4.0)
MCH: 31.9 pg (ref 26.0–34.0)
MCHC: 32.1 g/dL (ref 30.0–36.0)
MCV: 99.4 fL (ref 80.0–100.0)
Monocytes Absolute: 0.7 10*3/uL (ref 0.1–1.0)
Monocytes Relative: 8 %
Neutro Abs: 6.1 10*3/uL (ref 1.7–7.7)
Neutrophils Relative %: 76 %
Platelets: 215 10*3/uL (ref 150–400)
RBC: 3.23 MIL/uL — ABNORMAL LOW (ref 4.22–5.81)
RDW: 21.6 % — ABNORMAL HIGH (ref 11.5–15.5)
WBC: 8 10*3/uL (ref 4.0–10.5)
nRBC: 0.3 % — ABNORMAL HIGH (ref 0.0–0.2)

## 2023-08-15 LAB — COMPREHENSIVE METABOLIC PANEL WITH GFR
ALT: 11 U/L (ref 0–44)
AST: 16 U/L (ref 15–41)
Albumin: 2.4 g/dL — ABNORMAL LOW (ref 3.5–5.0)
Alkaline Phosphatase: 42 U/L (ref 38–126)
Anion gap: 17 — ABNORMAL HIGH (ref 5–15)
BUN: 165 mg/dL — ABNORMAL HIGH (ref 6–20)
CO2: 21 mmol/L — ABNORMAL LOW (ref 22–32)
Calcium: 8.2 mg/dL — ABNORMAL LOW (ref 8.9–10.3)
Chloride: 94 mmol/L — ABNORMAL LOW (ref 98–111)
Creatinine, Ser: 4.18 mg/dL — ABNORMAL HIGH (ref 0.61–1.24)
GFR, Estimated: 16 mL/min — ABNORMAL LOW (ref >60)
Glucose, Bld: 122 mg/dL — ABNORMAL HIGH (ref 70–99)
Potassium: 5 mmol/L (ref 3.5–5.1)
Sodium: 132 mmol/L — ABNORMAL LOW (ref 135–145)
Total Bilirubin: 0.6 mg/dL (ref 0.0–1.2)
Total Protein: 8 g/dL (ref 6.5–8.1)

## 2023-08-15 LAB — LACTATE DEHYDROGENASE: LDH: 315 U/L — ABNORMAL HIGH (ref 98–192)

## 2023-08-15 LAB — GLUCOSE, CAPILLARY
Glucose-Capillary: 110 mg/dL — ABNORMAL HIGH (ref 70–99)
Glucose-Capillary: 132 mg/dL — ABNORMAL HIGH (ref 70–99)
Glucose-Capillary: 160 mg/dL — ABNORMAL HIGH (ref 70–99)
Glucose-Capillary: 173 mg/dL — ABNORMAL HIGH (ref 70–99)
Glucose-Capillary: 176 mg/dL — ABNORMAL HIGH (ref 70–99)
Glucose-Capillary: 309 mg/dL — ABNORMAL HIGH (ref 70–99)
Glucose-Capillary: 315 mg/dL — ABNORMAL HIGH (ref 70–99)

## 2023-08-15 LAB — MISC LABCORP TEST (SEND OUT): Labcorp test code: 1024

## 2023-08-15 MED ORDER — PROSOURCE TF20 ENFIT COMPATIBL EN LIQD
60.0000 mL | Freq: Two times a day (BID) | ENTERAL | Status: DC
  Administered 2023-08-15 – 2023-08-16 (×2): 60 mL
  Filled 2023-08-15 (×2): qty 60

## 2023-08-15 MED ORDER — CLONAZEPAM 0.5 MG PO TABS
0.5000 mg | ORAL_TABLET | Freq: Three times a day (TID) | ORAL | Status: DC | PRN
  Filled 2023-08-15: qty 1

## 2023-08-15 MED ORDER — COLLAGENASE 250 UNIT/GM EX OINT
TOPICAL_OINTMENT | Freq: Every day | CUTANEOUS | Status: AC
  Administered 2023-08-18: 1 via TOPICAL
  Filled 2023-08-15: qty 30

## 2023-08-15 MED ORDER — JUVEN PO PACK
1.0000 | PACK | Freq: Two times a day (BID) | ORAL | Status: DC
  Administered 2023-08-15 – 2023-09-11 (×49): 1
  Filled 2023-08-15 (×47): qty 1

## 2023-08-15 NOTE — Progress Notes (Signed)
 NAME:  Frank [name]., MRN:  [id number], DOB:  04-Jul-1965, LOS: 43 ADMISSION DATE:  07/03/2023, CONSULTATION DATE:  07/06/23 REFERRING MD:  Dr Maury Space, CHIEF COMPLAINT:  Encephalopathy   History of Present Illness:   PCCM asked to see patient for encephalopathy   Transferred from [facility] with 1 week of generalized weakness, altered mentation, decreased appetite Recently being worked up for multiple myeloma.  Transferred to [facility] for oncology evaluation Nephrology consulted for AKI   Background history of obesity, asthma, obstructive sleep apnea on CPAP GERD, hypertension, hyperlipidemia Has been feeling weak for the last few days according to spouse Admitted with concern for sepsis, community-acquired pneumonia, antibiotic encephalopathy Workup revealed lytic bony lesions with concern for multiple myeloma Transfused with 3 units packed red blood cells Evaluation so far-bone marrow 07/04/2023  Pertinent  Medical History   Past Medical History:  Diagnosis Date   Anxiety    Arthritis    Asthma    Bipolar disorder (HCC)    Current every day smoker    Depression    Diabetes mellitus, type II (HCC)    Dyspnea    History of kidney stones    History of pneumonia    Hyperlipidemia    Hypertension    Morbid obesity (HCC)    Sedentary lifestyle    Seizures (HCC)    last seizure 12 yrs ago, no current problem   Sleep apnea    uses CPAP nightly   Significant Hospital Events: Including procedures, antibiotic start and stop dates in addition to other pertinent events   4/19-blood cultures, MRSA PCR negative 4/19 chest x-ray with mild pulmonary congestion 4/20 atrial fibrillation with RVR 4/21 nosebleed after IR NG attempt, NG placed by PCCM in afternoon, stable transferred to [facility] 4/22 intubated; some abla requiring prbcs; ct abd/pelvis showing acute groin hematoma tracking from posterior presumed from site of bone marrow biopsy; heparin  held EEG 4/25 >> moderate to severe  encephalopathy without any seizures or epileptiform discharges 4/23 respiratory culture >> MRSE 4/28 remains intubated on CRRT, down to one pressor  4/29 labile BP, NE back up. Responded a bit to fluids. Received Velcade  and Cytoxan  chemotherapy 5/1 remains encephalopathic, check CTH 5/2 LP ETT exchange  5/5 1 PRBC. Palliative care met w family  5/6 IVIG. plan for trach established. Added midodrine  after failed vaso wean. Abx/fungal changed to levaquin   5/7 weaning pressors incr midodrine  incr lactulose . Vaso off  5/8 Trach placed. Some post op bleeding improved with thrombi-pad 5/10 voriconazole  added back based on CT Chest findings, MRI brain with no acute changes 5/13 more alert and able to follow some commands today 5/19 and had been doing well on trach collar, and was on the verge of transferring to progressive when had a trach dislodgment. 5/22 more awake.  Interim History / Subjective:   Remains on trach collar.  Currently poorly responsive  Objective   Blood pressure (!) 115/92, pulse (!) 102, temperature 99.2 F (37.3 C), temperature source Axillary, resp. rate 19, height 6' 0.01" (1.829 m), weight 91.6 kg, SpO2 95%.    FiO2 (%):  [21 %] 21 %   Intake/Output Summary (Last 24 hours) at 08/15/2023 1006 Last data filed at 08/15/2023 0911 Gross per 24 hour  Intake 390 ml  Output 495 ml  Net -105 ml   Filed Weights   08/12/23 0702 08/13/23 0451 08/14/23 0500  Weight: 96.5 kg 96.9 kg 91.6 kg   #6 trach remains in place Minimal secretions and dry  Does not follow commands Diminished breath sounds in the bases Heart sounds are distant Remedies with edema    Assessment & Plan:   Metabolic encephalopathy Seizure disorder Hyperammonemia - corrected. Hypoactive delirium Acute hypoxemic respiratory failure - status post tracheostomy. Bilateral pleural effusions History of obstructive sleep apnea History of tobacco abuse Aspergillus pneumonia - questionable Acute  kidney injury on chronic kidney disease stage IIIa Multiple myeloma Paroxysmal atrial fibrillation in the setting of acute illness, now back in sinus rhythm. Now in sinus rhythm Type 2 diabetes  Plan:  - Keep on trach collar. - #6 cuffed trach in place  GOC: See IPAL from 5/7. Wife now updating goals to include 2-3 rounds of CPR maximum should he arrest. LTAC disposition pending, referral to [facility] has been made.  Appreciate palliative care involvement regarding goals of care  Best Practice (right click and "Reselect all SmartList Selections" daily)   Diet/type: tubefeeds  DVT prophylaxis prophylactic heparin   Pressure ulcer(s): identified on: 19/Apr/2025 GI prophylaxis: H2B Lines: Dialysis Catheter Foley:  na  Code Status:  full code Last date of multidisciplinary goals of care discussion d/w wife 5/7. See IPAL. Updated spouse by phone 08/07/2023.  Siegfried Dress Aleyna Cueva ACNP Acute Care Nurse Practitioner [facility] Pulmonary/Critical Care Please consult Amion 08/15/2023, 10:06 AM

## 2023-08-15 NOTE — Progress Notes (Signed)
 He is still having some temperatures.  Again, these might be from the underlying myeloma.  This certainly would be quite unusual.  All cultures have been negative.  I think he has been on treatment for the Aspergillus.  His last Kappa light chain was up to 26.9 mg/dL.  As such, I think we are going to have to make a change in his protocol.  I am going to have to use something a little bit more aggressive.,  I am a little surprised by this.  Typically, with our upfront regimen, we do get a good response.  However, I am not able to use a monoclonal antibody given that he is in the hospital.  What I will do is I will switch out the Velcade  and use Kyprolis.  This is IV.  I truly would also like to use a monoclonal antibody.  There are no labs back yet today.  He had a G-tube placed yesterday.  He does wake up.  He does seem to be somewhat alert.  The Tmax yesterday was 101.5.  Blood pressure has been okay.  Heart rate is up a little bit.  Again, this myeloma truly is a little bit more aggressive.  I think this is indicated by the cytogenetics that we have on him.  I still feel we can get a good response.  Again we will going to have to change of the protocol a little bit.  Again, it would be nice if we could add the monoclonal antibody to his regimen.  However, doing this in the hospital is very difficult.  We will continue to follow along.  I know he is getting great care from everybody upon 3 W.   [name], MD  Colossians 3:23

## 2023-08-15 NOTE — Progress Notes (Signed)
 Physical Therapy Treatment Patient Details Name: Frank [name]. MRN: [id number] DOB: 15-May-1965 Today's Date: 08/15/2023   History of Present Illness 58 y.o. male transferred from [facility] 07/03/23 to [facility] for management of newly diagnosed plasma cell myeloma with weakness and AMS. Pt also with AKI and metabolic encephalopathy. 5/10 transfer to [facility] for iHD. 4/22 Intubated and chemo initiated. 4/23-5/10 CRRT. 5/6 IVIG started. 5/8 trach. 5/12 iHD initiated. 5/18 inability to oxygenate through trach, intubated and trach revision. 5/29 PEG placed PMHx:Lt THA, carpal tunnel syndrome, Lt TKA, Lt humerus fx, PAF, HTN, HLD, bipolar disorder, obesity, T2DM    PT Comments  Pt is slowly progressing towards goals. Pt was able to tolerating sitting EOB for ~ 10 min this session before fatiguing performing LE/UE movements with limited L hand movements and RLE movements. Pt was 2 person Max A for bed mobility this session. Due to pt current functional status, home set up and available assistance at home recommending skilled physical therapy services < 3 hours/day in order to address strength, balance and functional mobility to decrease risk for falls, injury, immobility, skin break down and re-hospitalization.      If plan is discharge home, recommend the following: Two people to help with walking and/or transfers;Assistance with cooking/housework;Assist for transportation   Can travel by private vehicle     No  Equipment Recommendations  Hospital bed;Hoyer lift;Wheelchair (measurements PT);Wheelchair cushion (measurements PT) (recline back w/c with elevated leg rests and removable arm rests with tilt and space function)       Precautions / Restrictions Precautions Precautions: Fall Recall of Precautions/Restrictions: Impaired Precaution/Restrictions Comments: trach 28% 6L, cortrak (paused during session and restarted upon PT exit), flexiseal Restrictions Weight Bearing Restrictions Per Provider  Order: No Other Position/Activity Restrictions: Lt humerus non-union, splinted. assuming LUE NWB.     Mobility  Bed Mobility Overal bed mobility: Needs Assistance Bed Mobility: Supine to Sit, Sit to Supine Rolling: +2 for physical assistance, Max assist   Supine to sit: +2 for physical assistance, Max assist Sit to supine: +2 for physical assistance, Max assist   General bed mobility comments: Pt was +2 Max A for bed mobility; able to begin to initiate with heavy multi modal cueing with step by step instructions for sequencing. Pt able to sit EOB for ~10 min with occasional RLE movement. Frequent RUE movement. LImited hand movement on the L hand and limited RLE movement.    Transfers     General transfer comment: Deferred at this time    Ambulation/Gait     General Gait Details: Deferred at this time      Balance Overall balance assessment: Needs assistance Sitting-balance support: Bilateral upper extremity supported, Feet supported Sitting balance-Leahy Scale: Poor Sitting balance - Comments: Pt able to sit EOB at Mod A due to R and posterior lean Postural control: Right lateral lean, Posterior lean       Communication Communication Communication: Impaired Factors Affecting Communication: Trach/intubated  Cognition Arousal: Alert Behavior During Therapy: Flat affect   PT - Cognitive impairments: Difficult to assess Difficult to assess due to: Impaired communication, Tracheostomy, Level of arousal   PT - Cognition Comments: pt visually tracking, moreso to L than R. following commands on all extremities except LUE this date. nods yes/no with increased time and uses LLE to indicate yes or no (lifts LLE for yes) Following commands: Impaired Following commands impaired: Follows one step commands inconsistently, Follows one step commands with increased time    Cueing Cueing Techniques:  Tactile cues, Visual cues, Verbal cues     General Comments General comments (skin  integrity, edema, etc.): VItal signs stable throughout session. Supine BP : 110/87, after sitting 5 min 117/92      Pertinent Vitals/Pain Pain Assessment Pain Assessment: Faces Faces Pain Scale: No hurt Breathing: normal Negative Vocalization: none Facial Expression: smiling or inexpressive Body Language: relaxed Consolability: no need to console PAINAD Score: 0 Pain Intervention(s): Monitored during session     PT Goals (current goals can now be found in the care plan section) Acute Rehab PT Goals Patient Stated Goal: Pt unable PT Goal Formulation: Patient unable to participate in goal setting Time For Goal Achievement: 08/17/23 Potential to Achieve Goals: Fair Progress towards PT goals: Progressing toward goals    Frequency    Min 1X/week      PT Plan  Continue with current POC        AM-PAC PT "6 Clicks" Mobility   Outcome Measure  Help needed turning from your back to your side while in a flat bed without using bedrails?: Total Help needed moving from lying on your back to sitting on the side of a flat bed without using bedrails?: Total Help needed moving to and from a bed to a chair (including a wheelchair)?: Total Help needed standing up from a chair using your arms (e.g., wheelchair or bedside chair)?: Total Help needed to walk in hospital room?: Total Help needed climbing 3-5 steps with a railing? : Total 6 Click Score: 6    End of Session Equipment Utilized During Treatment: Oxygen Activity Tolerance: Patient limited by lethargy;Patient tolerated treatment well Patient left: in bed;with call bell/phone within reach;with bed alarm set Nurse Communication: Mobility status PT Visit Diagnosis: Other abnormalities of gait and mobility (R26.89);Muscle weakness (generalized) (M62.81);Difficulty in walking, not elsewhere classified (R26.2)     Time: 9604-5409 PT Time Calculation (min) (ACUTE ONLY): 27 min  Charges:    $Therapeutic Activity: 23-37  mins PT General Charges $$ ACUTE PT VISIT: 1 Visit                    Sloan Duncans, DPT, CLT  Acute Rehabilitation Services Office: [phone] (Secure chat preferred)    Frank [name] 08/15/2023, 3:37 PM

## 2023-08-15 NOTE — Progress Notes (Addendum)
  [facility] Progress Note   Assessment/ Plan:   # Oliguric AKI on CKD 3a - b/l creat 1.1- 1.5 from 2024. Creat here 5.8 on presentation. AKI due to myeloma kidney +/- hyperCa +/- ACEi. CRRT from 4/23--- 5/10.  on iHD now.   dialysis 5/22 following placement  tunneled HD catheter, also on 5/24-  - HD  5/26 d/t catabolic state - next HD Wednesday.  Hoping to stay on MWF schedule to optimize chemo schedule - increased time to 3.75 hrs d/t sig azotemia - for HD today, will do extra rx tomorrow given significant azotemia/ uremia  # AMS/acute metabolic encephalopathy: MRI, LP completed and unrevealing.  Waxes and wanes    #Volume -   Gentle UF moving forward   # Hypotension: normotensive,    # Multiple myeloma: started on Velcade  and Cytoxan ,  Per onc   #AHRF/ aspergillus PNA/ s/p trach 5/8: on voriconazole  IV, off all other IV antibiotics. Off the ventilator on trach collar, per CCM   # Atrial fib-  on amio   #Anemia - prn transfusions. On epogen  per onc.    # Persistent fevers with negative infectious workup thought to be related to malignancy  Subjective:    Seen in room.  For HD today.     Objective:   BP 114/84   Pulse (!) 113   Temp 98.5 F (36.9 C)   Resp 16   Ht 6' 0.01" (1.829 m)   Wt 92.5 kg   SpO2 98%   BMI 27.65 kg/m   Intake/Output Summary (Last 24 hours) at 08/15/2023 1411 Last data filed at 08/15/2023 1209 Gross per 24 hour  Intake 420 ml  Output 1095 ml  Net -675 ml   Weight change:   Physical Exam: Gen: NAD CVS: RRR Resp: on TC Abd: soft Ext: no LE edema ACCESS: TDC  Imaging: IR GASTROSTOMY TUBE MOD SED Result Date: 08/15/2023 INDICATION: 58 year old male with history of dysphagia requiring percutaneous gastrostomy access. EXAM: PERC PLACEMENT GASTROSTOMY MEDICATIONS: None. ANESTHESIA/SEDATION: Versed  1 mg IV; Fentanyl  50 mcg IV Moderate Sedation Time:  11 The patient was continuously monitored during the procedure by the  interventional radiology nurse under my direct supervision. CONTRAST:  20mL OMNIPAQUE  IOHEXOL  300 MG/ML SOLN - administered into the gastric lumen. FLUOROSCOPY TIME:  Three mGy COMPLICATIONS: None immediate. PROCEDURE: Informed written consent was obtained from the patient after a thorough discussion of the procedural risks, benefits and alternatives. All questions were addressed. Maximal Sterile barrier Technique was utilized including caps, mask, sterile gowns, sterile gloves, sterile drape, hand hygiene and skin antiseptic. A timeout was performed prior to the initiation of the procedure. The patient was placed on the procedure table in the supine position. Pre-procedure abdominal film confirmed visualization of the transverse colon. An angled 5-French catheter was passed through the nares into the stomach. The patient was prepped and draped in usual sterile fashion. The stomach was insufflated with air via the indwelling nasogastric tube. Under fluoroscopy, a puncture site was selected and local analgesia achieved with 1% lidocaine  infiltrated subcutaneously. Under fluoroscopic guidance, a gastropexy needle was passed into the stomach and the T-bar suture was released. Entry into the stomach was confirmed with fluoroscopy, aspiration of air, and injection of contrast material. This was repeated with an additional gastropexy suture (for a total of 2 fasteners). At the center of these gastropexy sutures, a dermatotomy was performed. An 18 gauge needle was passed into the stomach at the site of this dermatotomy, and position  within the gastric lumen again confirmed under fluoroscopy using aspiration of air and contrast injection. An Amplatz guidewire was passed through this needle and intraluminal placement within the stomach was confirmed by fluoroscopy. The needle was removed. Over the guidewire, the percutaneous tract was dilated using a 10 mm non-compliant balloon. The balloon was deflated, then pushed into the  gastric lumen followed in concert by the 20 Fr gastrostomy tube. The retention balloon of the percutaneous gastrostomy tube was inflated with 10 mL of sterile water . The tube was withdrawn until the retention balloon was at the edge of the gastric lumen. The external bumper was brought to the abdominal wall. Contrast was injected through the gastrostomy tube, confirming intraluminal positioning. The patient tolerated the procedure well without any immediate post-procedural complications. IMPRESSION: Technically successful placement of 20 Fr gastrostomy tube. Creasie Doctor, MD [facility] Radiology Electronically Signed   By: Creasie Doctor M.D.   On: 08/15/2023 07:42   DG Abd 1 View Result Date: 08/14/2023 CLINICAL DATA:  Pre gastrostomy evaluation EXAM: ABDOMEN - 1 VIEW COMPARISON:  None Available. FINDINGS: Scattered large and small bowel gas is noted. Feeding catheter is noted within the distal stomach. Dialysis catheter is noted with the tip in the right atrium. No free air seen. No obstructive changes are noted. Prior left hip replacement is seen. IMPRESSION: No acute abnormality noted.  Feeding catheter in the distal stomach. Electronically Signed   By: Violeta Grey M.D.   On: 08/14/2023 09:27     Labs: BMET Recent Labs  Lab 08/09/23 0719 08/10/23 4010 08/11/23 1059 08/12/23 0525 08/13/23 0531 08/14/23 0436 08/15/23 0809 08/15/23 0811  NA 137 134* 137  137 132* 131* 131* 132* 134*  K 4.7 4.4 4.8  4.9 4.2 5.2* 5.5* 5.0 5.1  CL 100 96* 99  99 97* 100 95* 94* 95*  CO2 21* 24 21*  20* 23 20* 21* 21* 21*  GLUCOSE 211* 201* 213*  218* 185* 169* 178* 122* 120*  BUN 165* 121* 166*  170* 117* 170* 118* 165* 166*  CREATININE 4.10* 3.28* 3.60*  3.61* 2.64* 3.50* 2.88* 4.18* 4.13*  CALCIUM  7.6* 7.9* 8.0*  8.2* 8.7* 7.9* 8.2* 8.2* 8.3*  PHOS 4.0 NOT CALCULATED 6.8*  --  RESULTS UNAVAILABLE DUE TO INTERFERING SUBSTANCE RESULTS UNAVAILABLE DUE  TO INTERFERING SUBSTANCE  --  7.5*   CBC Recent Labs  Lab 08/12/23 0525 08/13/23 0531 08/14/23 1043 08/15/23 0809  WBC 8.3 8.8 8.4 8.0  NEUTROABS 6.9 7.1 6.6 6.1  HGB 10.9* 9.7* 10.7* 10.3*  HCT 34.3* 31.1* 33.8* 32.1*  MCV 99.4 101.3* 99.7 99.4  PLT 259 252 207 215    Medications:     acyclovir  200 mg Per Tube BID   arformoterol   15 mcg Nebulization BID   collagenase   Topical Daily   feeding supplement (OSMOLITE 1.5 CAL)  355 mL Per Tube TID PC & HS   feeding supplement (PROSource TF20)  60 mL Per Tube BID   fiber  1 packet Per Tube BID   folic acid   1 mg Per Tube Daily   heparin  injection (subcutaneous)  5,000 Units Subcutaneous Q8H   insulin  aspart  0-20 Units Subcutaneous Q4H   insulin  aspart  4 Units Subcutaneous Q4H   insulin  glargine-yfgn  10 Units Subcutaneous BID   multivitamin  1 tablet Per Tube QHS   nicotine   7 mg Transdermal Daily   nutrition supplement (JUVEN)  1 packet Per Tube BID  BM   mouth rinse  15 mL Mouth Rinse 4 times per day   PARoxetine   40 mg Per Tube Daily   pneumococcal 20-valent conjugate vaccine  0.5 mL Intramuscular Tomorrow-1000   revefenacin   175 mcg Nebulization Daily   sodium chloride  flush  10-40 mL Intracatheter Q12H   sodium chloride  flush  10-40 mL Intracatheter Q12H   thiamine   100 mg Per Tube Daily   voriconazole   350 mg Per Tube Q12H    Leandra Pro, MD 08/15/2023, 2:11 PM

## 2023-08-15 NOTE — Progress Notes (Signed)
 [facility]  Date of Admission:  07/03/2023       Lines: 4/23-c right internal jugular hd cath -- holiday and replaced 08/07/23 Tracheostomy G tube  Rectal tube 4/20-c  Other: 4/22; 4/29; 5/21; 5/27 Cyclophosphamide   4/22; 4/29; 5/21; 5/27 bortezomib    Abx: 5/23-c acyclovir 5/4-6; 5/10-c vori  5/12-16 vanc 5/10-11 amp-sulb 5/6-13 levo  5/1-5/6 meropenem  4/28-5/6 vanc 4/23-5/1 piptazo 4/17-4/22 ceftriaxone  4/17-4/20 azith   ASSESSMENT: 58 yo male with left hip an knee arthroplasty, aggressive mm, left arm pathologic fracture, tobacco abuse/copd, marijuanna use, ckd3, admitted 07/08/23 in transfer for 1 week confusion found to have aki, acute on chronic anemia treated for MM complicated by neutropenia, fever, pna with aspergillus on sputum cx and progressive pulm opacity despite typical abx coverage    Other comlications Aki on crrt Thighs hematoma  Micro: 5/15 resp cx normal flora 5/11 bcx negative 5/11 hep b sAb 162 5/10 asp ab negative 5/09 resp culture staph hemolyticus 5/05 bcx negative 5/05 mrsa nares pcr negative 5/04 sputum cx rare mold 5/02 lp cx negative 4/30 resp cx aspergillus 4/23 resp cx staph epi    #pna #aspergillus sputum cx Question of true infection with the aspergillus on sputum cx Chest ct no entirely suggestive S/p tx for HAP On vori for presumed aspergillus pna -- plan at least 3 months treatment 5/30 Continues to fever but fever curve better since chemo restarted 5/21   #fever #neutropenia had resolved by 07/22/23 in setting stopping chemo and use of G-csf Fever through all abx/and voriconazole  Internal jugular hd line removed 5/20 but will replace for ongoing dialysis and doesn't appear to be line related 5/21 line removed but still have fever. Needing dialysis line replaced 5/22. Mixed linaeage multiple myeloma kappa/lambda and dr Maria Shiner restarted chemo 5/21  Fever curve looking better  as of 5/30, after chemo restarted Failed the 3 day naproxen  trial but suspect fever is paraneoplastic  Will be difficult to monitor but if hemodynamic compromise/high sofa score, would repeat blood culture as he remains at risk, in setting chemo, to develop acute bacterial infection   #mm #pathologic left arm fx Poor prognosis Extensive fever w/u so far negative onc to restart chemo 5/21   #resp failure S/p trach 5/08   #ams Question of intermittently following commands 5/02 lp no leukocytosis or abnormal chemistry; no cytology done Much improved mentation since 5/20 5/30 doing pt/ot   #thigh fluid collection Presumed hematoma; question if infected Given improved fever curve as of 5/30 with chemo, will leave alone  Principal Problem:   Generalized weakness Active Problems:   Multiple myeloma not having achieved remission (HCC)   Atrial fibrillation with rapid ventricular response (HCC)   AKI (acute kidney injury) (HCC)   Acute hypoxemic respiratory failure (HCC)   PAF (paroxysmal atrial fibrillation) (HCC)   Acute encephalopathy   Severe sepsis with septic shock (HCC)   On mechanically assisted ventilation (HCC)   Acute metabolic encephalopathy   Lobar pneumonia, unspecified organism (HCC)   Multiple myeloma without remission (HCC)   Protein-calorie malnutrition, severe   Malfunction of tracheostomy stoma (HCC)   Fungal infection   Fever   Pulmonary aspergillosis (HCC)   Allergies  Allergen Reactions   Morphine And Codeine Anaphylaxis   Shellfish Allergy Anaphylaxis   Betadine  [Povidone Iodine ] Itching   Bupropion Other (See Comments)    Shaking of the body, hallucinations    Chlorhexidine  Hives    Patient reports  never had issues CHG with mouth rinse.   Influenza Vaccines Hives   Metoprolol  Rash    Scheduled Meds:  acyclovir  200 mg Per Tube BID   arformoterol   15 mcg Nebulization BID   collagenase   Topical Daily   feeding supplement (OSMOLITE 1.5  CAL)  355 mL Per Tube TID PC & HS   feeding supplement (PROSource TF20)  60 mL Per Tube BID   fiber  1 packet Per Tube BID   folic acid   1 mg Per Tube Daily   heparin  injection (subcutaneous)  5,000 Units Subcutaneous Q8H   insulin  aspart  0-20 Units Subcutaneous Q4H   insulin  aspart  4 Units Subcutaneous Q4H   insulin  glargine-yfgn  10 Units Subcutaneous BID   multivitamin  1 tablet Per Tube QHS   nicotine   7 mg Transdermal Daily   nutrition supplement (JUVEN)  1 packet Per Tube BID BM   mouth rinse  15 mL Mouth Rinse 4 times per day   PARoxetine   40 mg Per Tube Daily   pneumococcal 20-valent conjugate vaccine  0.5 mL Intramuscular Tomorrow-1000   revefenacin   175 mcg Nebulization Daily   sodium chloride  flush  10-40 mL Intracatheter Q12H   sodium chloride  flush  10-40 mL Intracatheter Q12H   thiamine   100 mg Per Tube Daily   voriconazole   350 mg Per Tube Q12H   Continuous Infusions:  valproate sodium  750 mg (08/15/23 0914)   PRN Meds:.acetaminophen  (TYLENOL ) oral liquid 160 mg/5 mL **OR** acetaminophen , clonazePAM , hydrALAZINE , iohexol , ipratropium-albuterol , ondansetron  (ZOFRAN ) IV, mouth rinse, oxyCODONE , polyvinyl alcohol , sodium chloride  flush, sodium chloride  flush   SUBJECTIVE: Fever curve improved since chemo restarted Rectal tube remains Doing pt/ot No complaint S/p trach    Review of Systems: ROS All other ROS was negative, except mentioned above     OBJECTIVE: Vitals:   08/15/23 0403 08/15/23 0500 08/15/23 0737 08/15/23 1159  BP: (!) 109/92  (!) 115/92 119/89  Pulse:  (!) 119 (!) 102 (!) 110  Resp:  (!) 24 19 19   Temp: 99.2 F (37.3 C)  99.2 F (37.3 C) 99.7 F (37.6 C)  TempSrc: Oral  Axillary Oral  SpO2:   95% 95%  Weight:      Height:       Body mass index is 27.38 kg/m.  Physical Exam General/constitutional: up on edge of bed, doing pt/ot, cooperative HEENT: Normocephalic, PER, Conj Clear, poor dentition Neck: trach site no purulence CV:  tachy no mrg Lungs: course bs Abd: Soft, Nontender Skin: No Rash Neuro: alert; generalized weakness not much movement in extremities MSK: no peripheral joint swelling/tenderness/warmth; right thigh hematoma stable in size without tenderness; left upper ext in splint (pathologic left humerus fracture)    Lab Results Lab Results  Component Value Date   WBC 8.0 08/15/2023   HGB 10.3 (L) 08/15/2023   HCT 32.1 (L) 08/15/2023   MCV 99.4 08/15/2023   PLT 215 08/15/2023    Lab Results  Component Value Date   CREATININE 4.13 (H) 08/15/2023   BUN 166 (H) 08/15/2023   NA 134 (L) 08/15/2023   K 5.1 08/15/2023   CL 95 (L) 08/15/2023   CO2 21 (L) 08/15/2023    Lab Results  Component Value Date   ALT 11 08/15/2023   AST 16 08/15/2023   ALKPHOS 42 08/15/2023   BILITOT 0.6 08/15/2023      Microbiology: No results found for this or any previous visit (from the past 240 hours).  Serology:   Imaging: If present, new imagings (plain films, ct scans, and mri) have been personally visualized and interpreted; radiology reports have been reviewed. Decision making incorporated into the Impression / Recommendations.  5/20 left humerus xray Lateral plate and screw fixation of the distal humerus. Progressive decreased bone density of the distal humerus with loss of bone stock, as well as cortical margins medially. This is greater than typically seen with disuse and raises suspicion for infection or underlying lytic bone lesion. Clinical correlation is advised.  Jamesetta Mcbride, MD [facility] [phone] pager    08/15/2023, 1:01 PM

## 2023-08-15 NOTE — Progress Notes (Signed)
   08/15/23 1745  Vitals  Temp 99.1 F (37.3 C)  Pulse Rate (!) 109  Resp (!) 21  BP (!) 128/109  SpO2 99 %  O2 Device Tracheostomy Collar  Weight 91.3 kg  Post Treatment  Dialyzer Clearance Clotted  Hemodialysis Intake (mL) 0 mL  Liters Processed 64.5  Fluid Removed (mL) 1200 mL  Tolerated HD Treatment Yes  Post-Hemodialysis Comments TX TERMINATED 16 MINS EARLY DUE TO CLOTTIED SYSTEM BLOOD RETURNED PT STABLE   Received patient in bed to unit.  Alert and oriented.  Informed consent signed and in chart.   TX duration:2HRS DUE TO CLOTTING  Patient tolerated well.  Transported back to the room  Alert, without acute distress.  Hand-off given to patient's nurse.   Access used: [facility] Access issues: NONE  Total UF removed: 1.2L Medication(s) given: NONE    [facility]Shaminy T [facility]

## 2023-08-15 NOTE — Inpatient Diabetes Management (Signed)
 Inpatient Diabetes Program Recommendations  AACE/ADA: New Consensus Statement on Inpatient Glycemic Control (2015)  Target Ranges:  Prepandial:   less than 140 mg/dL      Peak postprandial:   less than 180 mg/dL (1-2 hours)      Critically ill patients:  140 - 180 mg/dL   Lab Results  Component Value Date   GLUCAP 173 (H) 08/15/2023   HGBA1C 6.0 (H) 08/14/2023    Review of Glycemic Control  Latest Reference Range & Units 08/14/23 08:45 08/14/23 12:18 08/14/23 16:05 08/14/23 19:52 08/14/23 21:41 08/14/23 23:41 08/15/23 01:02 08/15/23 04:04 08/15/23 08:32  Glucose-Capillary 70 - 99 mg/dL 161 (H) 096 (H) 045 (H) 147 (H) 180 (H) 230 (H) 315 (H) 110 (H) 173 (H)   DM Type 2 Outpatient Diabetes medications: Metformin  1000 mg bid Current orders for Inpatient glycemic control:  Semglee  10 units bid Novolog  0-20 units Q4 Novolog  4 units Q4 units Tube Feed Coverage Osmolite 4x/day (0800, 1200, 1800, 2300)  Inpatient Diabetes Program Recommendations:    -   Increase Tube Feed Coverage to Novolog  6 units Q4 hours  Thanks,  Eloise Hake RN, MSN, BC-ADM Inpatient Diabetes Coordinator Team Pager [phone] (8a-5p)

## 2023-08-15 NOTE — Progress Notes (Signed)
 PROGRESS NOTE  Frank [name]. ONG:[id number] DOB: January 13, 1966   PCP: Serita Danes, MD  Patient is from: Home  DOA: 07/03/2023 LOS: 43  Chief complaints No chief complaint on file.    Brief Narrative / Interim history: 58 year old M with PMH of OSA on CPAP, pathological fracture and multiple ORIF left humerus likely due to multiple myeloma, CKD-3A, A-fib not on AC, asthma, HTN, HLD and tobacco use disorder who had been feeling weak for about 1 week, admitted to [facility] with concern for sepsis, community-acquired pneumonia and acute encephalopathy.  Patient was found to have lytic lesions, he was transferred to [facility] for oncology treatment.  In the meantime, he developed encephalopathy, acute renal failure and remained very sick.  He remained in the ICU for 36 days.  Underwent tracheostomy. Deemed to be not stable for PEG tube placement.  He has been on tube feeds via cortrack.  Also started on dialysis after CRRT.   Significant events 4/17-admitted to medical floor with oncology consultation to treat multiple myeloma. 4/19-transferred to ICU intubated, encephalopathic. 4/20-A-fib with RVR.  Started heparin . Groin and biopsy site hematoma requiring multiple transfusions EEG 4/25 >> moderate to severe encephalopathy without any seizures or epileptiform discharges 4/29 Received Velcade  and Cytoxan  chemotherapy-intent palliative? 5/2 LP and ETT exchange  5/8 Trach placed. Some post op bleeding improved with thrombi-pad 5/10 voriconazole  added back based on CT Chest findings 5/13 more alert and able to follow some commands. 5/23, transferred to floor. 5/29-PEG placed  Subjective: Seen and examined earlier this morning.  No major events overnight or this morning.  Patient is sleepy but wakes to voice.  Nods no to pain and goes back to sleep.  Follows some commands.  Objective: Vitals:   08/15/23 0403 08/15/23 0500 08/15/23 0737 08/15/23 1159  BP: (!) 109/92   (!) 115/92 119/89  Pulse:  (!) 119 (!) 102 (!) 110  Resp:  (!) 24 19 19   Temp: 99.2 F (37.3 C)  99.2 F (37.3 C) 99.7 F (37.6 C)  TempSrc: Oral  Axillary Oral  SpO2:   95% 95%  Weight:      Height:        Examination:  GENERAL: No apparent distress.  Nontoxic. HEENT: MMM.  Vision and hearing grossly intact.  Cortrack. NECK: Tracheostomy in place. RESP:  No IWOB.  Rhonchi bilaterally. CVS: HR in 110s.  Heart sounds normal.  ABD/GI/GU: BS+. Abd soft, NTND.  G-tube and rectal tube in place. MSK/EXT: Moves right arm.  Left arm in brace.  Minimally moves BLE.  No edema.  NEURO: Sleepy but awakes to voice.  Not able to assess orientation due to speech difficulty in the setting of trach.  He nods no to pain.  Follows some commands but not consistently.  Moving lower extremities. PSYCH: Calm.  No distress or agitation.  Consultants:  Neurology Nephrology Oncology Interventional radiology Infectious disease Palliative   Microbiology summarized: 4/28 BCx > no growth, final 4/30 respiratory culture-few PMN, rare budding yeast, abundant GPC> few aspergillus fumigatus 5/2-Quantiferon  neg 5/4-trach asp  rare GPC, rare mold  5/5-PCP smear trach asp neg  5/9-trach asp abundant staph hemolyticus, few budding yeast 5/11-blood cultures negative 5/15-trach aspirate negative   CSF studies 1 RBC, 1 WBC, glucose 106 CSF culture-2 organisms acute, rare WBC> NGTD Cryptococcal antigen negative Meningitis and encephalitis panel negative VDRL  non-reactive   Assessment and plan: Acute hypoxic respiratory failure and respiratory insufficiency status post tracheostomy:  -Pulmonology following  intermittently I recommended keeping trach collar.  On #6 cuffed trach -Continue chest physiotherapy  Severe acute metabolic encephalopathy with hypoactive delirium, seizure disorder: Normal MRI brain.  LP negative for meningitis or encephalitis.  EEG negative for seizure but encephalopathy.  Home  Depakote  discontinued in ICU but resume later.  BUN elevated to 110s likely from tube feed.  Tremor and agitation reportedly improved with Klonopin  but has not been receiving Klonopin  often.  Currently sleepy -Reorientation and delirium precaution -Discontinue Klonopin . -Depacon  resumed on 5/25  Recurrent fever/SIRS: Continues to spike fever with some tachycardia and tachypnea.  Normotensive.  Likely paraneoplastic.  He is also at risk for aspiration pneumonitis but respiratory status stable. -Symptomatic management   Aspergillosis with pneumonia:  -On voriconazole  per ID.  ID to decide duration.  -Also on acyclovir.  NIDDM-2 with hyperglycemia: A1c 6.0% (was 6.7% in 01/2023) Recent Labs  Lab 08/14/23 2341 08/15/23 0102 08/15/23 0404 08/15/23 0832 08/15/23 1200  GLUCAP 230* 315* 110* 173* 160*  -Continue Semglee  10 units twice daily on 5/28 -Continue NovoLog  4 units every 4 hours -Continue SSI-resident every 4 hours -Recheck hemoglobin A1c  Multiple myeloma: Started on Velcade  and Cytoxan .  Palliative intent?  Oncology following.  AKI on CKD-3A/hyponatremia/hyperkalemia: Due to multiple myeloma, hypercalcemia and ACE inhibitors? -S/p CRRT while in ICU.   -Tunneled HD placed on 5/22.  Now on HD -Lokelma 10 g twice daily x 2 doses. -Nephrology managing   Paroxysmal A-fib: Now in sinus rhythm.  Rate controlled without meds.   Unable to anticoagulate due to hematoma and intolerance.   Obstructive sleep apnea: Now with trach collar.  Anxiety/bipolar disorder - Continue home Paxil  - Continue Depakote    Goal of care: Poor long-term prognosis.  Palliative from prior providers had discussion with patient's wife who is an EMT.  Remains full code with full scope of care.   Severe malnutrition Body mass index is 27.38 kg/m. Nutrition Problem: Severe Malnutrition Etiology: acute illness (prolonged illness, interuptions in feeding) Signs/Symptoms: moderate fat depletion, moderate  muscle depletion, percent weight loss (9.6% x 1 month) Percent weight loss: 9.6 % Interventions: Refer to RD note for recommendations, Tube feeding  Pressure skin injury: Pressure Injury 07/05/23 Heel Left;Right Unstageable - Full thickness tissue loss in which the base of the injury is covered by slough (yellow, tan, gray, green or brown) and/or eschar (tan, brown or black) in the wound bed. (Active)  07/05/23 1108  Location: Heel  Location Orientation: Left;Right  Staging: Unstageable - Full thickness tissue loss in which the base of the injury is covered by slough (yellow, tan, gray, green or brown) and/or eschar (tan, brown or black) in the wound bed.  Wound Description (Comments):   Present on Admission: Yes  Dressing Type Foam - Lift dressing to assess site every shift 08/15/23 0911     Pressure Injury 07/11/23 Buttocks Mid Unstageable - Full thickness tissue loss in which the base of the injury is covered by slough (yellow, tan, gray, green or brown) and/or eschar (tan, brown or black) in the wound bed. 8 cm x 6 cmx 0.1 cm (Active)  07/11/23 1600  Location: Buttocks  Location Orientation: Mid  Staging: Unstageable - Full thickness tissue loss in which the base of the injury is covered by slough (yellow, tan, gray, green or brown) and/or eschar (tan, brown or black) in the wound bed.  Wound Description (Comments): 8 cm x 6 cmx 0.1 cm  Present on Admission: No  Dressing Type Foam - Lift dressing  to assess site every shift 08/15/23 0911     Pressure Injury 07/27/23 Buttocks Right;Lower (Active)  07/27/23 1700  Location: Buttocks  Location Orientation: Right;Lower  Staging:   Wound Description (Comments):   Present on Admission: Yes  Dressing Type Foam - Lift dressing to assess site every shift 08/15/23 0911   DVT prophylaxis:  heparin  injection 5,000 Units Start: 08/14/23 2200 Place and maintain sequential compression device Start: 07/14/23 1033  Code Status: Full code Family  Communication: None at bedside. Level of care: Progressive Status is: Inpatient Remains inpatient appropriate because: Respiratory failure, encephalopathy, renal failure   Final disposition: LTAC   35 minutes with more than 50% spent in reviewing records, counseling patient/family and coordinating care.   Sch Meds:  Scheduled Meds:  acyclovir   200 mg Per Tube BID   arformoterol   15 mcg Nebulization BID   collagenase    Topical Daily   feeding supplement (OSMOLITE 1.5 CAL)  355 mL Per Tube TID PC & HS   feeding supplement (PROSource TF20)  60 mL Per Tube BID   fiber  1 packet Per Tube BID   folic acid   1 mg Per Tube Daily   heparin  injection (subcutaneous)  5,000 Units Subcutaneous Q8H   insulin  aspart  0-20 Units Subcutaneous Q4H   insulin  aspart  4 Units Subcutaneous Q4H   insulin  glargine-yfgn  10 Units Subcutaneous BID   multivitamin  1 tablet Per Tube QHS   nicotine   7 mg Transdermal Daily   nutrition supplement (JUVEN)  1 packet Per Tube BID BM   mouth rinse  15 mL Mouth Rinse 4 times per day   PARoxetine   40 mg Per Tube Daily   pneumococcal 20-valent conjugate vaccine  0.5 mL Intramuscular Tomorrow-1000   revefenacin   175 mcg Nebulization Daily   sodium chloride  flush  10-40 mL Intracatheter Q12H   sodium chloride  flush  10-40 mL Intracatheter Q12H   thiamine   100 mg Per Tube Daily   voriconazole   350 mg Per Tube Q12H   Continuous Infusions:  valproate sodium  750 mg (08/15/23 0914)   PRN Meds:.acetaminophen  (TYLENOL ) oral liquid 160 mg/5 mL **OR** acetaminophen , clonazePAM , hydrALAZINE , iohexol , ipratropium-albuterol , ondansetron  (ZOFRAN ) IV, mouth rinse, oxyCODONE , polyvinyl alcohol , sodium chloride  flush, sodium chloride  flush  Antimicrobials: Anti-infectives (From admission, onward)    Start     Dose/Rate Route Frequency Ordered Stop   08/14/23 0600  ceFAZolin  (ANCEF ) IVPB 2g/100 mL premix        2 g 200 mL/hr over 30 Minutes Intravenous To Radiology 08/13/23  1055 08/14/23 0606   08/09/23 1000  acyclovir  (ZOVIRAX ) 200 MG capsule 200 mg       Note to Pharmacy: Give after dialysis the pm dose, on dialysis days   200 mg Per Tube 2 times daily 08/08/23 2358     08/09/23 0100  acyclovir  (ZOVIRAX ) 200 MG capsule 200 mg       Note to Pharmacy: Give after dialysis the pm dose, on dialysis days   200 mg Per Tube  Once 08/09/23 0009 08/09/23 0013   08/08/23 2345  acyclovir  (ZOVIRAX ) 200 MG capsule 200 mg  Status:  Discontinued       Note to Pharmacy: Give after dialysis the pm dose, on dialysis days   200 mg Oral 2 times daily 08/08/23 2257 08/08/23 2358   08/07/23 1530  ceFAZolin  (ANCEF ) IVPB 2g/100 mL premix        over 30 Minutes Intravenous Continuous PRN 08/07/23 1555 08/07/23 1530  08/05/23 2200  voriconazole  (VFEND ) tablet 350 mg        350 mg Per Tube Every 12 hours 08/05/23 1511     08/01/23 1200  vancomycin  (VANCOCIN ) IVPB 1000 mg/200 mL premix        1,000 mg 200 mL/hr over 60 Minutes Intravenous Every M-W-F (Hemodialysis) 07/31/23 0649 08/01/23 1652   07/30/23 1200  vancomycin  (VANCOCIN ) IVPB 1000 mg/200 mL premix        1,000 mg 200 mL/hr over 60 Minutes Intravenous Every M-W-F (Hemodialysis) 07/30/23 0851 07/30/23 1241   07/29/23 0900  vancomycin  (VANCOCIN ) IVPB 1000 mg/200 mL premix        1,000 mg 200 mL/hr over 60 Minutes Intravenous  Once 07/29/23 0812 07/29/23 0943   07/28/23 1204  vancomycin  variable dose per unstable renal function (pharmacist dosing)  Status:  Discontinued         Does not apply See admin instructions 07/28/23 1204 07/31/23 0649   07/28/23 1000  voriconazole  (VFEND ) 350 mg in sodium chloride  0.9 % 100 mL IVPB  Status:  Discontinued       Placed in "Followed by" Linked Group   4 mg/kg  88.6 kg (Adjusted) 67.5 mL/hr over 120 Minutes Intravenous Every 12 hours 07/28/23 0748 08/05/23 1511   07/27/23 1000  voriconazole  (VFEND ) 400 mg in sodium chloride  0.9 % 100 mL IVPB  Status:  Discontinued       Placed in  "Followed by" Linked Group   4 mg/kg  100.6 kg 70 mL/hr over 120 Minutes Intravenous Every 12 hours 07/26/23 0826 07/28/23 0748   07/27/23 0945  vancomycin  (VANCOREADY) IVPB 2000 mg/400 mL        2,000 mg 200 mL/hr over 120 Minutes Intravenous  Once 07/27/23 0849 07/27/23 1237   07/26/23 1515  Ampicillin -Sulbactam (UNASYN ) 3 g in sodium chloride  0.9 % 100 mL IVPB  Status:  Discontinued        3 g 200 mL/hr over 30 Minutes Intravenous Every 8 hours 07/26/23 1418 07/27/23 0830   07/26/23 0915  voriconazole  (VFEND ) 600 mg in sodium chloride  0.9 % 150 mL IVPB       Placed in "Followed by" Linked Group   6 mg/kg  100.6 kg 105 mL/hr over 120 Minutes Intravenous Every 12 hours 07/26/23 0826 07/27/23 0048   07/26/23 0900  Ampicillin -Sulbactam (UNASYN ) 3 g in sodium chloride  0.9 % 100 mL IVPB  Status:  Discontinued        3 g 200 mL/hr over 30 Minutes Intravenous Every 12 hours 07/26/23 0826 07/26/23 1418   07/23/23 1000  levofloxacin  (LEVAQUIN ) IVPB 500 mg  Status:  Discontinued       Placed in "Followed by" Linked Group   500 mg 100 mL/hr over 60 Minutes Intravenous Every 24 hours 07/22/23 1213 07/23/23 1001   07/23/23 1000  levofloxacin  (LEVAQUIN ) IVPB 500 mg  Status:  Discontinued       Placed in "Followed by" Linked Group   500 mg 100 mL/hr over 60 Minutes Intravenous Every 24 hours 07/23/23 1001 07/28/23 0804   07/22/23 1300  levofloxacin  (LEVAQUIN ) IVPB 750 mg       Placed in "Followed by" Linked Group   750 mg 100 mL/hr over 90 Minutes Intravenous  Once 07/22/23 1213 07/22/23 1542   07/21/23 1600  voriconazole  (VFEND ) 300 mg in sodium chloride  0.9 % 100 mL IVPB  Status:  Discontinued        300 mg 65 mL/hr over 120 Minutes Intravenous Every 12  hours 07/21/23 1430 07/22/23 1415   07/21/23 1000  voriconazole  (VFEND ) tablet 200 mg  Status:  Discontinued        200 mg Oral Every 12 hours 07/20/23 0944 07/21/23 1015   07/20/23 1030  voriconazole  (VFEND ) 530 mg in sodium chloride  0.9 %  150 mL IVPB        6 mg/kg  88.6 kg (Adjusted) 101.5 mL/hr over 120 Minutes Intravenous Every 12 hours 07/20/23 0944 07/21/23 0030   07/19/23 1400  meropenem  (MERREM ) 1 g in sodium chloride  0.9 % 100 mL IVPB  Status:  Discontinued        1 g 200 mL/hr over 30 Minutes Intravenous Every 8 hours 07/19/23 1143 07/22/23 1213   07/18/23 0600  meropenem  (MERREM ) 1 g in sodium chloride  0.9 % 100 mL IVPB  Status:  Discontinued        1 g 200 mL/hr over 30 Minutes Intravenous Every 24 hours 07/17/23 0916 07/19/23 1143   07/17/23 0700  meropenem  (MERREM ) 1 g in sodium chloride  0.9 % 100 mL IVPB  Status:  Discontinued        1 g 200 mL/hr over 30 Minutes Intravenous Every 12 hours 07/17/23 0601 07/17/23 0916   07/15/23 1300  vancomycin  (VANCOCIN ) IVPB 1000 mg/200 mL premix  Status:  Discontinued        1,000 mg 200 mL/hr over 60 Minutes Intravenous Every 24 hours 07/14/23 1129 07/22/23 1319   07/14/23 1215  vancomycin  (VANCOREADY) IVPB 1750 mg/350 mL        1,750 mg 175 mL/hr over 120 Minutes Intravenous  Once 07/14/23 1129 07/14/23 1449   07/09/23 1800  piperacillin -tazobactam (ZOSYN ) IVPB 3.375 g  Status:  Discontinued        3.375 g 12.5 mL/hr over 240 Minutes Intravenous Every 12 hours 07/09/23 0756 07/09/23 0844   07/09/23 1400  piperacillin -tazobactam (ZOSYN ) IVPB 3.375 g  Status:  Discontinued        3.375 g 12.5 mL/hr over 240 Minutes Intravenous Every 8 hours 07/09/23 0844 07/17/23 0557   07/08/23 1300  piperacillin -tazobactam (ZOSYN ) IVPB 3.375 g  Status:  Discontinued        3.375 g 12.5 mL/hr over 240 Minutes Intravenous Every 8 hours 07/08/23 1129 07/09/23 0756   07/05/23 2200  azithromycin  (ZITHROMAX ) 500 mg in sodium chloride  0.9 % 250 mL IVPB        500 mg 250 mL/hr over 60 Minutes Intravenous Daily at bedtime 07/05/23 2022 07/06/23 2302   07/05/23 2115  azithromycin  (ZITHROMAX ) 250 mg in dextrose  5 % 125 mL IVPB  Status:  Discontinued       Note to Pharmacy: Missed dose this  morning   250 mg 127.5 mL/hr over 60 Minutes Intravenous Every 24 hours 07/05/23 2016 07/05/23 2021   07/04/23 0900  fluconazole  (DIFLUCAN ) IVPB 200 mg  Status:  Discontinued        200 mg 100 mL/hr over 60 Minutes Intravenous Every 48 hours 07/04/23 0722 07/09/23 0809   07/03/23 0600  cefTRIAXone  (ROCEPHIN ) 1 g in sodium chloride  0.9 % 100 mL IVPB  Status:  Discontinued        1 g 200 mL/hr over 30 Minutes Intravenous Every 24 hours 07/03/23 0508 07/08/23 1104   07/03/23 0600  azithromycin  (ZITHROMAX ) tablet 250 mg  Status:  Discontinued        250 mg Oral Daily 07/03/23 0508 07/05/23 2016        I have personally reviewed the following labs and  images: CBC: Recent Labs  Lab 08/11/23 1059 08/12/23 0525 08/13/23 0531 08/14/23 1043 08/15/23 0809  WBC 6.8 8.3 8.8 8.4 8.0  NEUTROABS 5.5 6.9 7.1 6.6 6.1  HGB 11.5* 10.9* 9.7* 10.7* 10.3*  HCT 37.9* 34.3* 31.1* 33.8* 32.1*  MCV 102.4* 99.4 101.3* 99.7 99.4  PLT 287 259 252 207 215   BMP &GFR Recent Labs  Lab 08/09/23 0719 08/10/23 0711 08/11/23 1059 08/12/23 0525 08/13/23 0531 08/14/23 0436 08/15/23 0809 08/15/23 0811  NA 137 134* 137  137 132* 131* 131* 132* 134*  K 4.7 4.4 4.8  4.9 4.2 5.2* 5.5* 5.0 5.1  CL 100 96* 99  99 97* 100 95* 94* 95*  CO2 21* 24 21*  20* 23 20* 21* 21* 21*  GLUCOSE 211* 201* 213*  218* 185* 169* 178* 122* 120*  BUN 165* 121* 166*  170* 117* 170* 118* 165* 166*  CREATININE 4.10* 3.28* 3.60*  3.61* 2.64* 3.50* 2.88* 4.18* 4.13*  CALCIUM  7.6* 7.9* 8.0*  8.2* 8.7* 7.9* 8.2* 8.2* 8.3*  MG 2.6* 2.3 2.7*  --   --   --   --   --   PHOS 4.0 NOT CALCULATED 6.8*  --  RESULTS UNAVAILABLE DUE TO INTERFERING SUBSTANCE RESULTS UNAVAILABLE DUE TO INTERFERING SUBSTANCE  --  7.5*   Estimated Creatinine Clearance: 21.1 mL/min (A) (by C-G formula based on SCr of 4.13 mg/dL (H)). Liver & Pancreas: Recent Labs  Lab 08/09/23 0719 08/10/23 0711 08/11/23 1059 08/12/23 0525 08/13/23 0531 08/14/23 0436  08/15/23 0809 08/15/23 0811  AST 15 16 14* 13*  --   --  16  --   ALT 7 12 10 11   --   --  11  --   ALKPHOS 47 46 45 43  --   --  42  --   BILITOT 1.0 1.1 0.8 0.9  --   --  0.6  --   PROT 8.1 8.7* 8.2* 8.5*  --   --  8.0  --   ALBUMIN  2.2* 2.4* 2.3*  2.3* 2.4* 2.2* 2.6* 2.4* 2.4*   No results for input(s): "LIPASE", "AMYLASE" in the last 168 hours. Recent Labs  Lab 08/12/23 2124  AMMONIA 14   Diabetic: Recent Labs    08/14/23 1145  HGBA1C 6.0*   Recent Labs  Lab 08/14/23 2341 08/15/23 0102 08/15/23 0404 08/15/23 0832 08/15/23 1200  GLUCAP 230* 315* 110* 173* 160*   Cardiac Enzymes: No results for input(s): "CKTOTAL", "CKMB", "CKMBINDEX", "TROPONINI" in the last 168 hours. No results for input(s): "PROBNP" in the last 8760 hours. Coagulation Profile: Recent Labs  Lab 08/13/23 0908  INR 1.0   Thyroid Function Tests: No results for input(s): "TSH", "T4TOTAL", "FREET4", "T3FREE", "THYROIDAB" in the last 72 hours. Lipid Profile: No results for input(s): "CHOL", "HDL", "LDLCALC", "TRIG", "CHOLHDL", "LDLDIRECT" in the last 72 hours. Anemia Panel: No results for input(s): "VITAMINB12", "FOLATE", "FERRITIN", "TIBC", "IRON", "RETICCTPCT" in the last 72 hours. Urine analysis:    Component Value Date/Time   COLORURINE STRAW (A) 07/05/2023 1912   APPEARANCEUR CLEAR 07/05/2023 1912   LABSPEC 1.008 07/05/2023 1912   PHURINE 7.0 07/05/2023 1912   GLUCOSEU NEGATIVE 07/05/2023 1912   HGBUR MODERATE (A) 07/05/2023 1912   BILIRUBINUR NEGATIVE 07/05/2023 1912   KETONESUR NEGATIVE 07/05/2023 1912   PROTEINUR 30 (A) 07/05/2023 1912   NITRITE NEGATIVE 07/05/2023 1912   LEUKOCYTESUR NEGATIVE 07/05/2023 1912   Sepsis Labs: Invalid input(s): "PROCALCITONIN", "LACTICIDVEN"  Microbiology: No results found for this or any  previous visit (from the past 240 hours).  Radiology Studies: No results found.     Tranquilino Fischler T. Jaryn Rosko [facility]  If 7PM-7AM, please contact  night-coverage [email or web address] 08/15/2023, 1:50 PM

## 2023-08-16 ENCOUNTER — Other Ambulatory Visit: Payer: Self-pay

## 2023-08-16 DIAGNOSIS — N179 Acute kidney failure, unspecified: Secondary | ICD-10-CM | POA: Diagnosis not present

## 2023-08-16 DIAGNOSIS — R531 Weakness: Secondary | ICD-10-CM | POA: Diagnosis not present

## 2023-08-16 DIAGNOSIS — J9601 Acute respiratory failure with hypoxia: Secondary | ICD-10-CM | POA: Diagnosis not present

## 2023-08-16 DIAGNOSIS — G9341 Metabolic encephalopathy: Secondary | ICD-10-CM | POA: Diagnosis not present

## 2023-08-16 LAB — COMPREHENSIVE METABOLIC PANEL WITH GFR
ALT: 7 U/L (ref 0–44)
AST: 13 U/L — ABNORMAL LOW (ref 15–41)
Albumin: 2.5 g/dL — ABNORMAL LOW (ref 3.5–5.0)
Alkaline Phosphatase: 40 U/L (ref 38–126)
Anion gap: 17 — ABNORMAL HIGH (ref 5–15)
BUN: 128 mg/dL — ABNORMAL HIGH (ref 6–20)
CO2: 23 mmol/L (ref 22–32)
Calcium: 8.1 mg/dL — ABNORMAL LOW (ref 8.9–10.3)
Chloride: 91 mmol/L — ABNORMAL LOW (ref 98–111)
Creatinine, Ser: 3.23 mg/dL — ABNORMAL HIGH (ref 0.61–1.24)
GFR, Estimated: 21 mL/min — ABNORMAL LOW (ref >60)
Glucose, Bld: 163 mg/dL — ABNORMAL HIGH (ref 70–99)
Potassium: 4.6 mmol/L (ref 3.5–5.1)
Sodium: 131 mmol/L — ABNORMAL LOW (ref 135–145)
Total Bilirubin: 0.5 mg/dL (ref 0.0–1.2)
Total Protein: 7.9 g/dL (ref 6.5–8.1)

## 2023-08-16 LAB — CBC WITH DIFFERENTIAL/PLATELET
Abs Immature Granulocytes: 0.03 10*3/uL (ref 0.00–0.07)
Basophils Absolute: 0 10*3/uL (ref 0.0–0.1)
Basophils Relative: 0 %
Eosinophils Absolute: 0 10*3/uL (ref 0.0–0.5)
Eosinophils Relative: 0 %
HCT: 30.5 % — ABNORMAL LOW (ref 39.0–52.0)
Hemoglobin: 9.8 g/dL — ABNORMAL LOW (ref 13.0–17.0)
Immature Granulocytes: 0 %
Lymphocytes Relative: 11 %
Lymphs Abs: 0.9 10*3/uL (ref 0.7–4.0)
MCH: 32.1 pg (ref 26.0–34.0)
MCHC: 32.1 g/dL (ref 30.0–36.0)
MCV: 100 fL (ref 80.0–100.0)
Monocytes Absolute: 0.6 10*3/uL (ref 0.1–1.0)
Monocytes Relative: 7 %
Neutro Abs: 6.9 10*3/uL (ref 1.7–7.7)
Neutrophils Relative %: 82 %
Platelets: 169 10*3/uL (ref 150–400)
RBC: 3.05 MIL/uL — ABNORMAL LOW (ref 4.22–5.81)
RDW: 21.2 % — ABNORMAL HIGH (ref 11.5–15.5)
WBC: 8.5 10*3/uL (ref 4.0–10.5)
nRBC: 0.5 % — ABNORMAL HIGH (ref 0.0–0.2)

## 2023-08-16 LAB — LACTATE DEHYDROGENASE: LDH: 307 U/L — ABNORMAL HIGH (ref 98–192)

## 2023-08-16 LAB — GLUCOSE, CAPILLARY
Glucose-Capillary: 149 mg/dL — ABNORMAL HIGH (ref 70–99)
Glucose-Capillary: 80 mg/dL (ref 70–99)
Glucose-Capillary: 72 mg/dL (ref 70–99)
Glucose-Capillary: 138 mg/dL — ABNORMAL HIGH (ref 70–99)
Glucose-Capillary: 208 mg/dL — ABNORMAL HIGH (ref 70–99)

## 2023-08-16 LAB — PHOSPHORUS: Phosphorus: 6.6 mg/dL — ABNORMAL HIGH (ref 2.5–4.6)

## 2023-08-16 LAB — BETA 2 MICROGLOBULIN, SERUM: Beta-2 Microglobulin: 19.5 mg/L — ABNORMAL HIGH (ref 0.6–2.4)

## 2023-08-16 MED ORDER — INSULIN ASPART 100 UNIT/ML IJ SOLN
0.0000 [IU] | Freq: Three times a day (TID) | INTRAMUSCULAR | Status: DC
  Administered 2023-08-16: 5 [IU] via SUBCUTANEOUS
  Administered 2023-08-16: 2 [IU] via SUBCUTANEOUS
  Administered 2023-08-17: 5 [IU] via SUBCUTANEOUS
  Administered 2023-08-17: 2 [IU] via SUBCUTANEOUS
  Administered 2023-08-17 – 2023-08-18 (×2): 3 [IU] via SUBCUTANEOUS
  Administered 2023-08-18: 11 [IU] via SUBCUTANEOUS
  Administered 2023-08-18: 3 [IU] via SUBCUTANEOUS
  Administered 2023-08-19: 8 [IU] via SUBCUTANEOUS
  Administered 2023-08-19: 3 [IU] via SUBCUTANEOUS
  Administered 2023-08-19 – 2023-08-20 (×2): 2 [IU] via SUBCUTANEOUS
  Administered 2023-08-20 – 2023-08-21 (×2): 5 [IU] via SUBCUTANEOUS
  Administered 2023-08-21: 3 [IU] via SUBCUTANEOUS
  Administered 2023-08-21: 8 [IU] via SUBCUTANEOUS
  Administered 2023-08-21: 2 [IU] via SUBCUTANEOUS
  Administered 2023-08-22 (×2): 5 [IU] via SUBCUTANEOUS
  Administered 2023-08-22: 3 [IU] via SUBCUTANEOUS
  Administered 2023-08-23 (×2): 2 [IU] via SUBCUTANEOUS
  Administered 2023-08-23: 3 [IU] via SUBCUTANEOUS
  Administered 2023-08-24 (×3): 2 [IU] via SUBCUTANEOUS
  Administered 2023-08-25 – 2023-08-27 (×4): 3 [IU] via SUBCUTANEOUS
  Administered 2023-08-27: 2 [IU] via SUBCUTANEOUS
  Administered 2023-08-28: 8 [IU] via SUBCUTANEOUS
  Administered 2023-08-28 (×2): 3 [IU] via SUBCUTANEOUS
  Administered 2023-08-29 (×2): 5 [IU] via SUBCUTANEOUS
  Administered 2023-08-30 – 2023-08-31 (×3): 3 [IU] via SUBCUTANEOUS
  Administered 2023-08-31 – 2023-09-02 (×3): 2 [IU] via SUBCUTANEOUS
  Administered 2023-09-03: 3 [IU] via SUBCUTANEOUS
  Administered 2023-09-03: 5 [IU] via SUBCUTANEOUS
  Administered 2023-09-05 (×2): 2 [IU] via SUBCUTANEOUS
  Administered 2023-09-06: 3 [IU] via SUBCUTANEOUS
  Administered 2023-09-06: 2 [IU] via SUBCUTANEOUS
  Administered 2023-09-06: 3 [IU] via SUBCUTANEOUS
  Administered 2023-09-07: 2 [IU] via SUBCUTANEOUS
  Administered 2023-09-07: 3 [IU] via SUBCUTANEOUS
  Administered 2023-09-08 (×2): 2 [IU] via SUBCUTANEOUS
  Administered 2023-09-08: 5 [IU] via SUBCUTANEOUS
  Administered 2023-09-09: 3 [IU] via SUBCUTANEOUS
  Administered 2023-09-09: 2 [IU] via SUBCUTANEOUS
  Administered 2023-09-09 – 2023-09-10 (×3): 3 [IU] via SUBCUTANEOUS
  Administered 2023-09-10 – 2023-09-12 (×3): 2 [IU] via SUBCUTANEOUS
  Administered 2023-09-12: 8 [IU] via SUBCUTANEOUS
  Administered 2023-09-12: 5 [IU] via SUBCUTANEOUS
  Administered 2023-09-13: 3 [IU] via SUBCUTANEOUS
  Administered 2023-09-13: 5 [IU] via SUBCUTANEOUS
  Administered 2023-09-13: 3 [IU] via SUBCUTANEOUS
  Administered 2023-09-13 – 2023-09-14 (×4): 2 [IU] via SUBCUTANEOUS
  Administered 2023-09-15: 3 [IU] via SUBCUTANEOUS
  Administered 2023-09-15 – 2023-09-17 (×7): 2 [IU] via SUBCUTANEOUS
  Administered 2023-09-18: 3 [IU] via SUBCUTANEOUS
  Administered 2023-09-19 (×2): 2 [IU] via SUBCUTANEOUS
  Administered 2023-09-21 – 2023-09-22 (×4): 3 [IU] via SUBCUTANEOUS
  Administered 2023-09-22: 2 [IU] via SUBCUTANEOUS
  Administered 2023-09-22: 3 [IU] via SUBCUTANEOUS
  Administered 2023-09-23 – 2023-09-24 (×3): 2 [IU] via SUBCUTANEOUS
  Administered 2023-09-24: 3 [IU] via SUBCUTANEOUS
  Administered 2023-09-25 – 2023-09-26 (×2): 2 [IU] via SUBCUTANEOUS
  Administered 2023-09-27: 3 [IU] via SUBCUTANEOUS
  Administered 2023-09-27 – 2023-09-29 (×4): 2 [IU] via SUBCUTANEOUS
  Administered 2023-09-30: 3 [IU] via SUBCUTANEOUS
  Administered 2023-09-30 – 2023-10-01 (×3): 2 [IU] via SUBCUTANEOUS
  Administered 2023-10-02 – 2023-10-03 (×4): 3 [IU] via SUBCUTANEOUS
  Administered 2023-10-03: 2 [IU] via SUBCUTANEOUS
  Administered 2023-10-04 – 2023-10-05 (×2): 3 [IU] via SUBCUTANEOUS
  Administered 2023-10-05: 4 [IU] via SUBCUTANEOUS
  Administered 2023-10-06: 5 [IU] via SUBCUTANEOUS
  Administered 2023-10-07 (×2): 3 [IU] via SUBCUTANEOUS
  Administered 2023-10-08 (×3): 2 [IU] via SUBCUTANEOUS
  Administered 2023-10-09: 3 [IU] via SUBCUTANEOUS
  Administered 2023-10-09 – 2023-10-10 (×2): 5 [IU] via SUBCUTANEOUS
  Administered 2023-10-10 (×3): 3 [IU] via SUBCUTANEOUS
  Administered 2023-10-11: 2 [IU] via SUBCUTANEOUS
  Administered 2023-10-11: 3 [IU] via SUBCUTANEOUS
  Administered 2023-10-11: 2 [IU] via SUBCUTANEOUS
  Administered 2023-10-12 (×3): 3 [IU] via SUBCUTANEOUS
  Administered 2023-10-12: 2 [IU] via SUBCUTANEOUS
  Administered 2023-10-13 (×3): 3 [IU] via SUBCUTANEOUS
  Administered 2023-10-14: 2 [IU] via SUBCUTANEOUS
  Administered 2023-10-14: 3 [IU] via SUBCUTANEOUS
  Administered 2023-10-14: 2 [IU] via SUBCUTANEOUS
  Administered 2023-10-15: 3 [IU] via SUBCUTANEOUS
  Administered 2023-10-15: 2 [IU] via SUBCUTANEOUS
  Administered 2023-10-16 (×2): 3 [IU] via SUBCUTANEOUS
  Administered 2023-10-16: 2 [IU] via SUBCUTANEOUS
  Administered 2023-10-17: 3 [IU] via SUBCUTANEOUS
  Administered 2023-10-17: 11 [IU] via SUBCUTANEOUS
  Administered 2023-10-17 – 2023-10-18 (×4): 3 [IU] via SUBCUTANEOUS
  Administered 2023-10-18 – 2023-10-20 (×4): 2 [IU] via SUBCUTANEOUS
  Administered 2023-10-20 – 2023-10-21 (×2): 3 [IU] via SUBCUTANEOUS
  Administered 2023-10-21: 5 [IU] via SUBCUTANEOUS
  Administered 2023-10-21: 3 [IU] via SUBCUTANEOUS
  Administered 2023-10-22: 2 [IU] via SUBCUTANEOUS
  Administered 2023-10-22: 3 [IU] via SUBCUTANEOUS
  Administered 2023-10-22: 2 [IU] via SUBCUTANEOUS
  Administered 2023-10-22: 5 [IU] via SUBCUTANEOUS
  Administered 2023-10-23: 3 [IU] via SUBCUTANEOUS
  Administered 2023-10-23: 2 [IU] via SUBCUTANEOUS
  Administered 2023-10-23: 5 [IU] via SUBCUTANEOUS
  Administered 2023-10-24 (×2): 2 [IU] via SUBCUTANEOUS
  Administered 2023-10-25: 3 [IU] via SUBCUTANEOUS
  Administered 2023-10-25 – 2023-10-26 (×4): 2 [IU] via SUBCUTANEOUS
  Administered 2023-10-26: 4 [IU] via SUBCUTANEOUS
  Administered 2023-10-27: 2 [IU] via SUBCUTANEOUS
  Administered 2023-10-27 (×2): 3 [IU] via SUBCUTANEOUS
  Administered 2023-10-27: 2 [IU] via SUBCUTANEOUS
  Administered 2023-10-27 (×2): 3 [IU] via SUBCUTANEOUS
  Administered 2023-10-28: 2 [IU] via SUBCUTANEOUS
  Administered 2023-10-28: 3 [IU] via SUBCUTANEOUS
  Administered 2023-10-28: 2 [IU] via SUBCUTANEOUS
  Administered 2023-10-28 (×3): 3 [IU] via SUBCUTANEOUS
  Administered 2023-10-29: 2 [IU] via SUBCUTANEOUS
  Administered 2023-10-29: 3 [IU] via SUBCUTANEOUS
  Administered 2023-10-29: 2 [IU] via SUBCUTANEOUS
  Administered 2023-10-29 – 2023-10-30 (×2): 3 [IU] via SUBCUTANEOUS
  Administered 2023-10-30: 8 [IU] via SUBCUTANEOUS
  Administered 2023-10-31: 3 [IU] via SUBCUTANEOUS
  Administered 2023-10-31: 2 [IU] via SUBCUTANEOUS
  Administered 2023-10-31: 3 [IU] via SUBCUTANEOUS
  Administered 2023-11-01: 2 [IU] via SUBCUTANEOUS
  Administered 2023-11-01 – 2023-11-02 (×2): 3 [IU] via SUBCUTANEOUS
  Administered 2023-11-03: 2 [IU] via SUBCUTANEOUS
  Administered 2023-11-03 – 2023-11-04 (×4): 3 [IU] via SUBCUTANEOUS
  Administered 2023-11-05 – 2023-11-06 (×3): 2 [IU] via SUBCUTANEOUS

## 2023-08-16 MED ORDER — HEPARIN SODIUM (PORCINE) 1000 UNIT/ML IJ SOLN
3000.0000 [IU] | Freq: Once | INTRAMUSCULAR | Status: AC
  Administered 2023-08-16: 3000 [IU] via INTRAVENOUS

## 2023-08-16 MED ORDER — INSULIN ASPART 100 UNIT/ML IJ SOLN
2.0000 [IU] | Freq: Three times a day (TID) | INTRAMUSCULAR | Status: DC
  Administered 2023-08-16 – 2023-11-07 (×296): 2 [IU] via SUBCUTANEOUS

## 2023-08-16 MED ORDER — HEPARIN SODIUM (PORCINE) 1000 UNIT/ML IJ SOLN
INTRAMUSCULAR | Status: AC
  Filled 2023-08-16: qty 8

## 2023-08-16 MED ORDER — PROSOURCE TF20 ENFIT COMPATIBL EN LIQD
60.0000 mL | Freq: Two times a day (BID) | ENTERAL | Status: DC
  Administered 2023-08-16 – 2023-08-19 (×6): 60 mL
  Filled 2023-08-16 (×5): qty 60

## 2023-08-16 NOTE — Progress Notes (Signed)
 Overall, everything is relatively stable with Frank [name].  He is still having some low-grade temperatures..  He has a G-tube in now.  He is getting feeds 4 times a day.  His labs show sodium 131.  Potassium 4.6.  BUN 128 creatinine 3.23.  Calcium  8.1 with an albumin  of 2.5.  The white cell count is 8.5.  Hemoglobin 9.8.  Platelet count 169,000.  I will have to change of his protocol.  Again, the light chains have been going up.  Again, this indicates to me that he has several populations of myeloma cells.  This population of kappa producing light chains I think is clearly more resilient.  On his FISH panel, he has a 1p deletion.  Given the fact, I think we need to try to add Sarclisa to his protocol.  I am also going to switch out the Velcade  for Kyprolis.  He does have significantly adverse cytogenetics.  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  He still have some low-grade temperatures.  His vital signs show a temperature of 99.1.  Pulse 102.  Blood pressure 109/85.  Head and neck exam shows no ocular or oral lesions.  Has no palpable cervical or supraclavicular lymph nodes.  Lungs are clear bilaterally.  Cardiac exam is tachycardic but regular.  Abdomen is obese but soft.  Bowel sounds are present.  There is no fluid wave.  There is no palpable liver or spleen tip.  Extremities shows this large fluid collection in the right upper thigh.  I am unsure exactly what is the etiology of this.  Will probably need to get some type of scan or ultrasound.  Maybe, an ultrasound would be the easiest way to find out what is going on.  Again, we are making some progress.  However, we really need to make more progress with the myeloma given the adverse cytogenetics and FISH panel.  This is why I really believe we have to treat him with Sarclisa/Kyprolis/Cytoxan .  I do appreciate everybody's help on 3W.   Frank Cal, MD  [facility]ston Cornea 1:5

## 2023-08-16 NOTE — Plan of Care (Signed)
  Problem: Clinical Measurements: Goal: Ability to maintain clinical measurements within normal limits will improve Outcome: Progressing Goal: Will remain free from infection Outcome: Progressing Goal: Diagnostic test results will improve Outcome: Progressing Goal: Respiratory complications will improve Outcome: Progressing Goal: Cardiovascular complication will be avoided Outcome: Progressing   Problem: Nutrition: Goal: Adequate nutrition will be maintained Outcome: Progressing   Problem: Elimination: Goal: Will not experience complications related to bowel motility Outcome: Progressing Goal: Will not experience complications related to urinary retention Outcome: Progressing   Problem: Pain Managment: Goal: General experience of comfort will improve and/or be controlled Outcome: Progressing   Problem: Safety: Goal: Ability to remain free from injury will improve Outcome: Progressing   Problem: Skin Integrity: Goal: Risk for impaired skin integrity will decrease Outcome: Progressing   Problem: Education: Goal: Ability to describe self-care measures that may prevent or decrease complications (Diabetes Survival Skills Education) will improve Outcome: Progressing Goal: Individualized Educational Video(s) Outcome: Progressing

## 2023-08-16 NOTE — Progress Notes (Signed)
 PROGRESS NOTE  Frank [name]. ZOX:[id number] DOB: Mar 26, 1965   PCP: Serita Danes, MD  Patient is from: Home  DOA: 07/03/2023 LOS: 44  Chief complaints No chief complaint on file.    Brief Narrative / Interim history: 58 year old M with PMH of OSA on CPAP, pathological fracture and multiple ORIF left humerus likely due to multiple myeloma, CKD-3A, A-fib not on AC, asthma, HTN, HLD and tobacco use disorder who had been feeling weak for about 1 week, admitted to [facility] with concern for sepsis, community-acquired pneumonia and acute encephalopathy.  Patient was found to have lytic lesions, he was transferred to [address] [facility] for oncology treatment.  In the meantime, he developed encephalopathy, acute renal failure and remained very sick.  He remained in the ICU for 36 days.  Underwent tracheostomy. Deemed to be not stable for PEG tube placement.  He has been on tube feeds via cortrack.  Also started on dialysis after CRRT.   Significant events 4/17-admitted to medical floor with oncology consultation to treat multiple myeloma. 4/19-transferred to ICU intubated, encephalopathic. 4/20-A-fib with RVR.  Started heparin . Groin and biopsy site hematoma requiring multiple transfusions EEG 4/25 >> moderate to severe encephalopathy without any seizures or epileptiform discharges 4/29 Received Velcade  and Cytoxan  chemotherapy-intent palliative? 5/2 LP and ETT exchange  5/8 Trach placed. Some post op bleeding improved with thrombi-pad 5/10 voriconazole  added back based on CT Chest findings 5/13 more alert and able to follow some commands. 5/23, transferred to floor. 5/29-PEG placed 5/31 chemotherapy changed to Sarclisa/Kyprolis/Cytoxan .   Subjective: Seen and examined earlier this morning.  No major events overnight or this morning.  Currently on dialysis.  Objective: Vitals:   08/16/23 1130 08/16/23 1200 08/16/23 1230 08/16/23 1300  BP: 102/88 (!) 106/93 (!) 119/55 (!)  122/101  Pulse: (!) 116 (!) 114 (!) 109 (!) 108  Resp: (!) 27 (!) 22 18 18   Temp:      TempSrc:      SpO2: 96% 100% 100% 96%  Weight:      Height:        Examination:  GENERAL: No apparent distress.  Nontoxic. HEENT: MMM.  Vision and hearing grossly intact.  Cortrack. NECK: Tracheostomy in place. RESP:  No IWOB.  Rhonchi bilaterally. CVS: HR in 110s.  Heart sounds normal.  ABD/GI/GU: BS+. Abd soft, NTND.  G-tube and rectal tube in place. MSK/EXT: Moves right arm.  Left arm in brace.  Minimally moves BLE.  No edema.  NEURO: Sleepy but awakes to voice.  Not able to assess orientation due to speech difficulty in the setting of trach.  He nods no to pain.  Follows some commands but not consistently.  Moving lower extremities. PSYCH: Calm.  No distress or agitation.  Consultants:  Neurology Nephrology Oncology Interventional radiology Infectious disease Palliative   Microbiology summarized: 4/28 BCx > no growth, final 4/30 respiratory culture-few PMN, rare budding yeast, abundant GPC> few aspergillus fumigatus 5/2-Quantiferon  neg 5/4-trach asp  rare GPC, rare mold  5/5-PCP smear trach asp neg  5/9-trach asp abundant staph hemolyticus, few budding yeast 5/11-blood cultures negative 5/15-trach aspirate negative   CSF studies 1 RBC, 1 WBC, glucose 106 CSF culture-2 organisms acute, rare WBC> NGTD Cryptococcal antigen negative Meningitis and encephalitis panel negative VDRL  non-reactive   Assessment and plan: Acute hypoxic respiratory failure and respiratory insufficiency status post tracheostomy:  -Pulmonology following intermittently I recommended keeping trach collar.  On #6 cuffed trach -Continue chest physiotherapy  Severe acute metabolic  encephalopathy with hypoactive delirium, seizure disorder: Normal MRI brain.  LP negative for meningitis or encephalitis.  EEG negative for seizure but encephalopathy.  Home Depakote  discontinued in ICU but resume later.  BUN  elevated to 110s likely from tube feed.  Tremor and agitation reportedly improved with Klonopin  but has not been receiving Klonopin  often.  Currently sleepy -Reorientation and delirium precaution -Discontinued Klonopin  on 5/30. -Depacon  resumed on 5/25  Multiple myeloma: Initially started on Velcade  and Cytoxan .  Chemotherapy changed to Sarclisa/Kyprolis/Cytoxan  on 5/31.   - Oncology following.  Recurrent fever/SIRS: Continues to spike fever with some tachycardia and tachypnea.  Normotensive.  Likely paraneoplastic.  He is also at risk for aspiration pneumonitis but respiratory status stable. -Symptomatic management   Aspergillosis with pneumonia:  -On voriconazole  per ID.  ID to decide duration.  -Also on acyclovir .  NIDDM-2 with hyperglycemia: A1c 6.0% (was 6.7% in 01/2023) Recent Labs  Lab 08/15/23 1952 08/15/23 2346 08/16/23 0443 08/16/23 0747 08/16/23 0845  GLUCAP 309* 176* 149* 80 72  -Continue Semglee  10 units twice daily on 5/28 -Decrease SSI from resistant to moderate -Decrease NovoLog  from 4 to 2 units every 4 hours -Further adjustment as appropriate.   AKI on CKD-3A/azotemia/hyponatremia/hyperkalemia: Due to multiple myeloma, hypercalcemia and ACE inhibitors?  BUN remains elevated. -S/p CRRT while in ICU.   -Tunneled HD placed on 5/22.  Now on HD -Lokelma  10 g twice daily x 2 doses. -Nephrology managing   Paroxysmal A-fib: Now in sinus rhythm.  Rate controlled without meds.   Unable to anticoagulate due to hematoma and intolerance.   Obstructive sleep apnea: Now with trach collar.  Anxiety/bipolar disorder - Continue home Paxil  - Continue Depakote    Goal of care: Poor long-term prognosis.  Palliative from prior providers had discussion with patient's wife who is an EMT.  Remains full code with full scope of care.   Severe malnutrition Body mass index is 27.98 kg/m. Nutrition Problem: Severe Malnutrition Etiology: acute illness (prolonged illness,  interuptions in feeding) Signs/Symptoms: moderate fat depletion, moderate muscle depletion, percent weight loss (9.6% x 1 month) Percent weight loss: 9.6 % Interventions: Refer to RD note for recommendations, Tube feeding  Pressure skin injury: Pressure Injury 07/05/23 Heel Left;Right Unstageable - Full thickness tissue loss in which the base of the injury is covered by slough (yellow, tan, gray, green or brown) and/or eschar (tan, brown or black) in the wound bed. (Active)  07/05/23 1108  Location: Heel  Location Orientation: Left;Right  Staging: Unstageable - Full thickness tissue loss in which the base of the injury is covered by slough (yellow, tan, gray, green or brown) and/or eschar (tan, brown or black) in the wound bed.  Wound Description (Comments):   Present on Admission: Yes  Dressing Type Other (Comment) (foam) 08/16/23 0500     Pressure Injury 07/11/23 Buttocks Mid Unstageable - Full thickness tissue loss in which the base of the injury is covered by slough (yellow, tan, gray, green or brown) and/or eschar (tan, brown or black) in the wound bed. 8 cm x 6 cmx 0.1 cm (Active)  07/11/23 1600  Location: Buttocks  Location Orientation: Mid  Staging: Unstageable - Full thickness tissue loss in which the base of the injury is covered by slough (yellow, tan, gray, green or brown) and/or eschar (tan, brown or black) in the wound bed.  Wound Description (Comments): 8 cm x 6 cmx 0.1 cm  Present on Admission: No  Dressing Type Other (Comment) (Foam) 08/16/23 0500  Pressure Injury 07/27/23 Buttocks Right;Lower (Active)  07/27/23 1700  Location: Buttocks  Location Orientation: Right;Lower  Staging:   Wound Description (Comments):   Present on Admission: Yes  Dressing Type Foam - Lift dressing to assess site every shift 08/15/23 2000   DVT prophylaxis:  heparin  injection 5,000 Units Start: 08/14/23 2200 Place and maintain sequential compression device Start: 07/14/23 1033  Code  Status: Full code Family Communication: None at bedside. Level of care: Progressive Status is: Inpatient Remains inpatient appropriate because: Respiratory failure, encephalopathy, renal failure   Final disposition: LTAC   35 minutes with more than 50% spent in reviewing records, counseling patient/family and coordinating care.   Sch Meds:  Scheduled Meds:  acyclovir   200 mg Per Tube BID   arformoterol   15 mcg Nebulization BID   collagenase    Topical Daily   feeding supplement (OSMOLITE 1.5 CAL)  355 mL Per Tube TID PC & HS   feeding supplement (PROSource TF20)  60 mL Per Tube BID   fiber  1 packet Per Tube BID   folic acid   1 mg Per Tube Daily   heparin  injection (subcutaneous)  5,000 Units Subcutaneous Q8H   insulin  aspart  0-20 Units Subcutaneous Q4H   insulin  aspart  4 Units Subcutaneous Q4H   insulin  glargine-yfgn  10 Units Subcutaneous BID   multivitamin  1 tablet Per Tube QHS   nicotine   7 mg Transdermal Daily   nutrition supplement (JUVEN)  1 packet Per Tube BID BM   mouth rinse  15 mL Mouth Rinse 4 times per day   PARoxetine   40 mg Per Tube Daily   pneumococcal 20-valent conjugate vaccine  0.5 mL Intramuscular Tomorrow-1000   revefenacin   175 mcg Nebulization Daily   sodium chloride  flush  10-40 mL Intracatheter Q12H   sodium chloride  flush  10-40 mL Intracatheter Q12H   thiamine   100 mg Per Tube Daily   voriconazole   350 mg Per Tube Q12H   Continuous Infusions:  valproate sodium  Stopped (08/15/23 2330)   PRN Meds:.acetaminophen  (TYLENOL ) oral liquid 160 mg/5 mL **OR** acetaminophen , hydrALAZINE , iohexol , ipratropium-albuterol , ondansetron  (ZOFRAN ) IV, mouth rinse, oxyCODONE , polyvinyl alcohol , sodium chloride  flush, sodium chloride  flush  Antimicrobials: Anti-infectives (From admission, onward)    Start     Dose/Rate Route Frequency Ordered Stop   08/14/23 0600  ceFAZolin  (ANCEF ) IVPB 2g/100 mL premix        2 g 200 mL/hr over 30 Minutes Intravenous To  Radiology 08/13/23 1055 08/14/23 0606   08/09/23 1000  acyclovir  (ZOVIRAX ) 200 MG capsule 200 mg       Note to Pharmacy: Give after dialysis the pm dose, on dialysis days   200 mg Per Tube 2 times daily 08/08/23 2358     08/09/23 0100  acyclovir  (ZOVIRAX ) 200 MG capsule 200 mg       Note to Pharmacy: Give after dialysis the pm dose, on dialysis days   200 mg Per Tube  Once 08/09/23 0009 08/09/23 0013   08/08/23 2345  acyclovir  (ZOVIRAX ) 200 MG capsule 200 mg  Status:  Discontinued       Note to Pharmacy: Give after dialysis the pm dose, on dialysis days   200 mg Oral 2 times daily 08/08/23 2257 08/08/23 2358   08/07/23 1530  ceFAZolin  (ANCEF ) IVPB 2g/100 mL premix        over 30 Minutes Intravenous Continuous PRN 08/07/23 1555 08/07/23 1530   08/05/23 2200  voriconazole  (VFEND ) tablet 350 mg  350 mg Per Tube Every 12 hours 08/05/23 1511     08/01/23 1200  vancomycin  (VANCOCIN ) IVPB 1000 mg/200 mL premix        1,000 mg 200 mL/hr over 60 Minutes Intravenous Every M-W-F (Hemodialysis) 07/31/23 0649 08/01/23 1652   07/30/23 1200  vancomycin  (VANCOCIN ) IVPB 1000 mg/200 mL premix        1,000 mg 200 mL/hr over 60 Minutes Intravenous Every M-W-F (Hemodialysis) 07/30/23 0851 07/30/23 1241   07/29/23 0900  vancomycin  (VANCOCIN ) IVPB 1000 mg/200 mL premix        1,000 mg 200 mL/hr over 60 Minutes Intravenous  Once 07/29/23 0812 07/29/23 0943   07/28/23 1204  vancomycin  variable dose per unstable renal function (pharmacist dosing)  Status:  Discontinued         Does not apply See admin instructions 07/28/23 1204 07/31/23 0649   07/28/23 1000  voriconazole  (VFEND ) 350 mg in sodium chloride  0.9 % 100 mL IVPB  Status:  Discontinued       Placed in "Followed by" Linked Group   4 mg/kg  88.6 kg (Adjusted) 67.5 mL/hr over 120 Minutes Intravenous Every 12 hours 07/28/23 0748 08/05/23 1511   07/27/23 1000  voriconazole  (VFEND ) 400 mg in sodium chloride  0.9 % 100 mL IVPB  Status:  Discontinued        Placed in "Followed by" Linked Group   4 mg/kg  100.6 kg 70 mL/hr over 120 Minutes Intravenous Every 12 hours 07/26/23 0826 07/28/23 0748   07/27/23 0945  vancomycin  (VANCOREADY) IVPB 2000 mg/400 mL        2,000 mg 200 mL/hr over 120 Minutes Intravenous  Once 07/27/23 0849 07/27/23 1237   07/26/23 1515  Ampicillin -Sulbactam (UNASYN ) 3 g in sodium chloride  0.9 % 100 mL IVPB  Status:  Discontinued        3 g 200 mL/hr over 30 Minutes Intravenous Every 8 hours 07/26/23 1418 07/27/23 0830   07/26/23 0915  voriconazole  (VFEND ) 600 mg in sodium chloride  0.9 % 150 mL IVPB       Placed in "Followed by" Linked Group   6 mg/kg  100.6 kg 105 mL/hr over 120 Minutes Intravenous Every 12 hours 07/26/23 0826 07/27/23 0048   07/26/23 0900  Ampicillin -Sulbactam (UNASYN ) 3 g in sodium chloride  0.9 % 100 mL IVPB  Status:  Discontinued        3 g 200 mL/hr over 30 Minutes Intravenous Every 12 hours 07/26/23 0826 07/26/23 1418   07/23/23 1000  levofloxacin  (LEVAQUIN ) IVPB 500 mg  Status:  Discontinued       Placed in "Followed by" Linked Group   500 mg 100 mL/hr over 60 Minutes Intravenous Every 24 hours 07/22/23 1213 07/23/23 1001   07/23/23 1000  levofloxacin  (LEVAQUIN ) IVPB 500 mg  Status:  Discontinued       Placed in "Followed by" Linked Group   500 mg 100 mL/hr over 60 Minutes Intravenous Every 24 hours 07/23/23 1001 07/28/23 0804   07/22/23 1300  levofloxacin  (LEVAQUIN ) IVPB 750 mg       Placed in "Followed by" Linked Group   750 mg 100 mL/hr over 90 Minutes Intravenous  Once 07/22/23 1213 07/22/23 1542   07/21/23 1600  voriconazole  (VFEND ) 300 mg in sodium chloride  0.9 % 100 mL IVPB  Status:  Discontinued        300 mg 65 mL/hr over 120 Minutes Intravenous Every 12 hours 07/21/23 1430 07/22/23 1415   07/21/23 1000  voriconazole  (VFEND ) tablet 200 mg  Status:  Discontinued        200 mg Oral Every 12 hours 07/20/23 0944 07/21/23 1015   07/20/23 1030  voriconazole  (VFEND ) 530 mg in sodium  chloride 0.9 % 150 mL IVPB        6 mg/kg  88.6 kg (Adjusted) 101.5 mL/hr over 120 Minutes Intravenous Every 12 hours 07/20/23 0944 07/21/23 0030   07/19/23 1400  meropenem  (MERREM ) 1 g in sodium chloride  0.9 % 100 mL IVPB  Status:  Discontinued        1 g 200 mL/hr over 30 Minutes Intravenous Every 8 hours 07/19/23 1143 07/22/23 1213   07/18/23 0600  meropenem  (MERREM ) 1 g in sodium chloride  0.9 % 100 mL IVPB  Status:  Discontinued        1 g 200 mL/hr over 30 Minutes Intravenous Every 24 hours 07/17/23 0916 07/19/23 1143   07/17/23 0700  meropenem  (MERREM ) 1 g in sodium chloride  0.9 % 100 mL IVPB  Status:  Discontinued        1 g 200 mL/hr over 30 Minutes Intravenous Every 12 hours 07/17/23 0601 07/17/23 0916   07/15/23 1300  vancomycin  (VANCOCIN ) IVPB 1000 mg/200 mL premix  Status:  Discontinued        1,000 mg 200 mL/hr over 60 Minutes Intravenous Every 24 hours 07/14/23 1129 07/22/23 1319   07/14/23 1215  vancomycin  (VANCOREADY) IVPB 1750 mg/350 mL        1,750 mg 175 mL/hr over 120 Minutes Intravenous  Once 07/14/23 1129 07/14/23 1449   07/09/23 1800  piperacillin -tazobactam (ZOSYN ) IVPB 3.375 g  Status:  Discontinued        3.375 g 12.5 mL/hr over 240 Minutes Intravenous Every 12 hours 07/09/23 0756 07/09/23 0844   07/09/23 1400  piperacillin -tazobactam (ZOSYN ) IVPB 3.375 g  Status:  Discontinued        3.375 g 12.5 mL/hr over 240 Minutes Intravenous Every 8 hours 07/09/23 0844 07/17/23 0557   07/08/23 1300  piperacillin -tazobactam (ZOSYN ) IVPB 3.375 g  Status:  Discontinued        3.375 g 12.5 mL/hr over 240 Minutes Intravenous Every 8 hours 07/08/23 1129 07/09/23 0756   07/05/23 2200  azithromycin  (ZITHROMAX ) 500 mg in sodium chloride  0.9 % 250 mL IVPB        500 mg 250 mL/hr over 60 Minutes Intravenous Daily at bedtime 07/05/23 2022 07/06/23 2302   07/05/23 2115  azithromycin  (ZITHROMAX ) 250 mg in dextrose  5 % 125 mL IVPB  Status:  Discontinued       Note to Pharmacy: Missed  dose this morning   250 mg 127.5 mL/hr over 60 Minutes Intravenous Every 24 hours 07/05/23 2016 07/05/23 2021   07/04/23 0900  fluconazole  (DIFLUCAN ) IVPB 200 mg  Status:  Discontinued        200 mg 100 mL/hr over 60 Minutes Intravenous Every 48 hours 07/04/23 0722 07/09/23 0809   07/03/23 0600  cefTRIAXone  (ROCEPHIN ) 1 g in sodium chloride  0.9 % 100 mL IVPB  Status:  Discontinued        1 g 200 mL/hr over 30 Minutes Intravenous Every 24 hours 07/03/23 0508 07/08/23 1104   07/03/23 0600  azithromycin  (ZITHROMAX ) tablet 250 mg  Status:  Discontinued        250 mg Oral Daily 07/03/23 0508 07/05/23 2016        I have personally reviewed the following labs and images: CBC: Recent Labs  Lab 08/12/23 0525 08/13/23 0531 08/14/23 1043 08/15/23 0809 08/16/23 0417  WBC 8.3 8.8 8.4 8.0 8.5  NEUTROABS 6.9 7.1 6.6 6.1 6.9  HGB 10.9* 9.7* 10.7* 10.3* 9.8*  HCT 34.3* 31.1* 33.8* 32.1* 30.5*  MCV 99.4 101.3* 99.7 99.4 100.0  PLT 259 252 207 215 169   BMP &GFR Recent Labs  Lab 08/10/23 0711 08/11/23 1059 08/12/23 0525 08/13/23 0531 08/14/23 0436 08/15/23 0809 08/15/23 0811 08/16/23 0417  NA 134* 137  137   < > 131* 131* 132* 134* 131*  K 4.4 4.8  4.9   < > 5.2* 5.5* 5.0 5.1 4.6  CL 96* 99  99   < > 100 95* 94* 95* 91*  CO2 24 21*  20*   < > 20* 21* 21* 21* 23  GLUCOSE 201* 213*  218*   < > 169* 178* 122* 120* 163*  BUN 121* 166*  170*   < > 170* 118* 165* 166* 128*  CREATININE 3.28* 3.60*  3.61*   < > 3.50* 2.88* 4.18* 4.13* 3.23*  CALCIUM  7.9* 8.0*  8.2*   < > 7.9* 8.2* 8.2* 8.3* 8.1*  MG 2.3 2.7*  --   --   --   --   --   --   PHOS NOT CALCULATED 6.8*  --  RESULTS UNAVAILABLE DUE TO INTERFERING SUBSTANCE RESULTS UNAVAILABLE DUE TO INTERFERING SUBSTANCE  --  7.5* 6.6*   < > = values in this interval not displayed.   Estimated Creatinine Clearance: 29.3 mL/min (A) (by C-G formula based on SCr of 3.23 mg/dL (H)). Liver & Pancreas: Recent Labs  Lab 08/10/23 0711  08/11/23 1059 08/12/23 0525 08/13/23 0531 08/14/23 0436 08/15/23 0809 08/15/23 0811 08/16/23 0417  AST 16 14* 13*  --   --  16  --  13*  ALT 12 10 11   --   --  11  --  7  ALKPHOS 46 45 43  --   --  42  --  40  BILITOT 1.1 0.8 0.9  --   --  0.6  --  0.5  PROT 8.7* 8.2* 8.5*  --   --  8.0  --  7.9  ALBUMIN  2.4* 2.3*  2.3* 2.4* 2.2* 2.6* 2.4* 2.4* 2.5*   No results for input(s): "LIPASE", "AMYLASE" in the last 168 hours. Recent Labs  Lab 08/12/23 2124  AMMONIA 14   Diabetic: Recent Labs    08/14/23 1145  HGBA1C 6.0*   Recent Labs  Lab 08/15/23 1952 08/15/23 2346 08/16/23 0443 08/16/23 0747 08/16/23 0845  GLUCAP 309* 176* 149* 80 72   Cardiac Enzymes: No results for input(s): "CKTOTAL", "CKMB", "CKMBINDEX", "TROPONINI" in the last 168 hours. No results for input(s): "PROBNP" in the last 8760 hours. Coagulation Profile: Recent Labs  Lab 08/13/23 0908  INR 1.0   Thyroid Function Tests: No results for input(s): "TSH", "T4TOTAL", "FREET4", "T3FREE", "THYROIDAB" in the last 72 hours. Lipid Profile: No results for input(s): "CHOL", "HDL", "LDLCALC", "TRIG", "CHOLHDL", "LDLDIRECT" in the last 72 hours. Anemia Panel: No results for input(s): "VITAMINB12", "FOLATE", "FERRITIN", "TIBC", "IRON", "RETICCTPCT" in the last 72 hours. Urine analysis:    Component Value Date/Time   COLORURINE STRAW (A) 07/05/2023 1912   APPEARANCEUR CLEAR 07/05/2023 1912   LABSPEC 1.008 07/05/2023 1912   PHURINE 7.0 07/05/2023 1912   GLUCOSEU NEGATIVE 07/05/2023 1912   HGBUR MODERATE (A) 07/05/2023 1912   BILIRUBINUR NEGATIVE 07/05/2023 1912   KETONESUR NEGATIVE 07/05/2023 1912   PROTEINUR 30 (A) 07/05/2023 1912   NITRITE NEGATIVE 07/05/2023 1912   LEUKOCYTESUR  NEGATIVE 07/05/2023 1912   Sepsis Labs: Invalid input(s): "PROCALCITONIN", "LACTICIDVEN"  Microbiology: No results found for this or any previous visit (from the past 240 hours).  Radiology Studies: No results  found.     Senaya Dicenso T. Christyana Corwin [facility]  If 7PM-7AM, please contact night-coverage [email or web address] 08/16/2023, 1:50 PM

## 2023-08-16 NOTE — Procedures (Signed)
 Patient seen and examined on Hemodialysis. The procedure was supervised and I have made appropriate changes. BP (!) 122/101   Pulse (!) 108   Temp 98.9 F (37.2 C)   Resp 18   Ht 6' 0.01" (1.829 m)   Wt 93.6 kg   SpO2 96%   BMI 27.98 kg/m   QB 400 mL via TDC, UF goal even  Tolerating treatment without complaints at this time.   Leandra Pro MD [facility] [phone] 1:06 PM

## 2023-08-16 NOTE — Progress Notes (Signed)
   08/16/23 1400  Vitals  Temp 98.1 F (36.7 C)  Pulse Rate (!) 106  Resp (!) 22  BP (!) 130/101  SpO2 100 %  O2 Device Tracheostomy Collar  Weight 92.8 kg  Type of Weight Post-Dialysis  Oxygen Therapy  O2 Flow Rate (L/min) 5 L/min  Patient Activity (if Appropriate) In bed  Pulse Oximetry Type Continuous  Oximetry Probe Site Changed No  Post Treatment  Dialyzer Clearance Lightly streaked  Hemodialysis Intake (mL) 0 mL  Liters Processed 7805  Fluid Removed (mL) 700 mL  Tolerated HD Treatment Yes   Received patient in bed to unit.  Alert --trach Informed consent signed and in chart.   TX duration:3.75  Patient tolerated well.  Transported back to the room  Alert, without acute distress.  Hand-off given to patient's nurse.   Access used: [email or web address] Access issues: no complications  Total UF removed: 700 Medication(s) given: none   [facility]

## 2023-08-16 NOTE — Progress Notes (Signed)
 [facility] Progress Note   Assessment/ Plan:   # Oliguric AKI on CKD 3a - b/l creat 1.1- 1.5 from 2024. Creat here 5.8 on presentation. AKI due to myeloma kidney +/- hyperCa +/- ACEi. CRRT from 4/23--- 5/10.  on iHD now.   dialysis 5/22 following placement  tunneled HD catheter, also on 5/24-  - HD  5/26 d/t catabolic state - next HD Wednesday.  Hoping to stay on MWF schedule to optimize chemo schedule - increased time to 3.75 hrs d/t sig azotemia - HD 5/30 and 5/31, next HD Monday after that  # AMS/acute metabolic encephalopathy: MRI, LP completed and unrevealing.  Waxes and wanes    #Volume -   Gentle UF moving forward   # Hypotension: normotensive,    # Multiple myeloma: started on Velcade  and Cytoxan ,  Per onc   #AHRF/ aspergillus PNA/ s/p trach 5/8: on voriconazole  IV, off all other IV antibiotics. Off the ventilator on trach collar, per CCM   # Atrial fib-  on amio   #Anemia - prn transfusions. On epogen  per onc.      Subjective:    Resting in bed, less agitated.  Less secretions too.   Objective:   BP (!) 122/101   Pulse (!) 108   Temp 98.9 F (37.2 C)   Resp 18   Ht 6' 0.01" (1.829 m)   Wt 93.6 kg   SpO2 96%   BMI 27.98 kg/m   Intake/Output Summary (Last 24 hours) at 08/16/2023 1304 Last data filed at 08/16/2023 0531 Gross per 24 hour  Intake 205 ml  Output 2770 ml  Net -2565 ml   Weight change:   Physical Exam: Gen: NAD CVS: RRR Resp: on TC Abd: soft Ext: no LE edema ACCESS: TDC  Imaging: No results found.    Labs: BMET Recent Labs  Lab 08/10/23 0711 08/11/23 1059 08/12/23 0525 08/13/23 0531 08/14/23 0436 08/15/23 0809 08/15/23 0811 08/16/23 0417  NA 134* 137  137 132* 131* 131* 132* 134* 131*  K 4.4 4.8  4.9 4.2 5.2* 5.5* 5.0 5.1 4.6  CL 96* 99  99 97* 100 95* 94* 95* 91*  CO2 24 21*  20* 23 20* 21* 21* 21* 23  GLUCOSE 201* 213*  218* 185* 169* 178* 122* 120* 163*  BUN 121* 166*  170* 117* 170* 118* 165*  166* 128*  CREATININE 3.28* 3.60*  3.61* 2.64* 3.50* 2.88* 4.18* 4.13* 3.23*  CALCIUM  7.9* 8.0*  8.2* 8.7* 7.9* 8.2* 8.2* 8.3* 8.1*  PHOS NOT CALCULATED 6.8*  --  RESULTS UNAVAILABLE DUE TO INTERFERING SUBSTANCE RESULTS UNAVAILABLE DUE TO INTERFERING SUBSTANCE  --  7.5* 6.6*   CBC Recent Labs  Lab 08/13/23 0531 08/14/23 1043 08/15/23 0809 08/16/23 0417  WBC 8.8 8.4 8.0 8.5  NEUTROABS 7.1 6.6 6.1 6.9  HGB 9.7* 10.7* 10.3* 9.8*  HCT 31.1* 33.8* 32.1* 30.5*  MCV 101.3* 99.7 99.4 100.0  PLT 252 207 215 169    Medications:     acyclovir   200 mg Per Tube BID   arformoterol   15 mcg Nebulization BID   collagenase    Topical Daily   feeding supplement (OSMOLITE 1.5 CAL)  355 mL Per Tube TID PC & HS   feeding supplement (PROSource TF20)  60 mL Per Tube BID   fiber  1 packet Per Tube BID   folic acid   1 mg Per Tube Daily   heparin  injection (subcutaneous)  5,000 Units Subcutaneous Q8H   insulin  aspart  0-20 Units Subcutaneous Q4H   insulin  aspart  4 Units Subcutaneous Q4H   insulin  glargine-yfgn  10 Units Subcutaneous BID   multivitamin  1 tablet Per Tube QHS   nicotine   7 mg Transdermal Daily   nutrition supplement (JUVEN)  1 packet Per Tube BID BM   mouth rinse  15 mL Mouth Rinse 4 times per day   PARoxetine   40 mg Per Tube Daily   pneumococcal 20-valent conjugate vaccine  0.5 mL Intramuscular Tomorrow-1000   revefenacin   175 mcg Nebulization Daily   sodium chloride  flush  10-40 mL Intracatheter Q12H   sodium chloride  flush  10-40 mL Intracatheter Q12H   thiamine   100 mg Per Tube Daily   voriconazole   350 mg Per Tube Q12H    Leandra Pro, MD 08/16/2023, 1:04 PM

## 2023-08-16 NOTE — Progress Notes (Signed)
 DISCONTINUE OFF PATHWAY REGIMEN - Multiple Myeloma and Other Plasma Cell Dyscrasias   [id number]:Bortezomib  1.3 mg/m2 SUBQ D1,4,8,11 + Dexamethasone  40 mg PO D1,4,8,11 q21 Days:   A cycle is every 21 days:     Dexamethasone       Bortezomib    **Always confirm dose/schedule in your pharmacy ordering system**  PRIOR TREATMENT: Off Pathway: Bortezomib  1.3 mg/m2 SUBQ D1,4,8,11 + Dexamethasone  40 mg PO D1,4,8,11 q21 Days  START OFF PATHWAY REGIMEN - Multiple Myeloma and Other Plasma Cell Dyscrasias   [id number]:Carfilzomib 20/56 mg/m2 IV + Dexamethasone  20 mg PO/IV + Isatuximab 10 mg/kg IV q28 Days:   Cycle 1: A cycle is 28 days:     Dexamethasone       Isatuximab-irfc      Carfilzomib      Carfilzomib    Cycles 2 and beyond: A cycle is every 28 days:     Dexamethasone       Isatuximab-irfc      Carfilzomib   **Always confirm dose/schedule in your pharmacy ordering system**  Patient Characteristics: Multiple Myeloma, Relapsed / Refractory, Second through Fourth Lines of Therapy, Candidate for CAR T-cell Therapy Disease Classification: Multiple Myeloma Therapeutic Status: Relapsed R2-ISS Staging: IV Line of Therapy: Second Line Intent of Therapy: Non-Curative / Palliative Intent, Discussed with Patient

## 2023-08-17 DIAGNOSIS — N179 Acute kidney failure, unspecified: Secondary | ICD-10-CM | POA: Diagnosis not present

## 2023-08-17 DIAGNOSIS — G9341 Metabolic encephalopathy: Secondary | ICD-10-CM | POA: Diagnosis not present

## 2023-08-17 DIAGNOSIS — R531 Weakness: Secondary | ICD-10-CM | POA: Diagnosis not present

## 2023-08-17 DIAGNOSIS — J9601 Acute respiratory failure with hypoxia: Secondary | ICD-10-CM | POA: Diagnosis not present

## 2023-08-17 LAB — PHOSPHORUS: Phosphorus: 5.8 mg/dL — ABNORMAL HIGH (ref 2.5–4.6)

## 2023-08-17 LAB — GLUCOSE, CAPILLARY
Glucose-Capillary: 130 mg/dL — ABNORMAL HIGH (ref 70–99)
Glucose-Capillary: 209 mg/dL — ABNORMAL HIGH (ref 70–99)
Glucose-Capillary: 143 mg/dL — ABNORMAL HIGH (ref 70–99)
Glucose-Capillary: 211 mg/dL — ABNORMAL HIGH (ref 70–99)
Glucose-Capillary: 177 mg/dL — ABNORMAL HIGH (ref 70–99)

## 2023-08-17 LAB — COMPREHENSIVE METABOLIC PANEL WITH GFR
ALT: 5 U/L (ref 0–44)
AST: 13 U/L — ABNORMAL LOW (ref 15–41)
Albumin: 2.6 g/dL — ABNORMAL LOW (ref 3.5–5.0)
Alkaline Phosphatase: 45 U/L (ref 38–126)
Anion gap: 14 (ref 5–15)
BUN: 95 mg/dL — ABNORMAL HIGH (ref 6–20)
CO2: 24 mmol/L (ref 22–32)
Calcium: 8 mg/dL — ABNORMAL LOW (ref 8.9–10.3)
Chloride: 93 mmol/L — ABNORMAL LOW (ref 98–111)
Creatinine, Ser: 2.63 mg/dL — ABNORMAL HIGH (ref 0.61–1.24)
GFR, Estimated: 27 mL/min — ABNORMAL LOW (ref >60)
Glucose, Bld: 127 mg/dL — ABNORMAL HIGH (ref 70–99)
Potassium: 4.8 mmol/L (ref 3.5–5.1)
Sodium: 131 mmol/L — ABNORMAL LOW (ref 135–145)
Total Bilirubin: 0.7 mg/dL (ref 0.0–1.2)
Total Protein: 8.1 g/dL (ref 6.5–8.1)

## 2023-08-17 LAB — LACTATE DEHYDROGENASE: LDH: 304 U/L — ABNORMAL HIGH (ref 98–192)

## 2023-08-17 LAB — CBC WITH DIFFERENTIAL/PLATELET
Abs Immature Granulocytes: 0 10*3/uL (ref 0.00–0.07)
Basophils Absolute: 0 10*3/uL (ref 0.0–0.1)
Basophils Relative: 0 %
Eosinophils Absolute: 0 10*3/uL (ref 0.0–0.5)
Eosinophils Relative: 0 %
HCT: 33 % — ABNORMAL LOW (ref 39.0–52.0)
Hemoglobin: 10.4 g/dL — ABNORMAL LOW (ref 13.0–17.0)
Lymphocytes Relative: 7 %
Lymphs Abs: 0.6 10*3/uL — ABNORMAL LOW (ref 0.7–4.0)
MCH: 32.2 pg (ref 26.0–34.0)
MCHC: 31.5 g/dL (ref 30.0–36.0)
MCV: 102.2 fL — ABNORMAL HIGH (ref 80.0–100.0)
Monocytes Absolute: 0.5 10*3/uL (ref 0.1–1.0)
Monocytes Relative: 6 %
Neutro Abs: 7.7 10*3/uL (ref 1.7–7.7)
Neutrophils Relative %: 87 %
Platelets: 170 10*3/uL (ref 150–400)
RBC: 3.23 MIL/uL — ABNORMAL LOW (ref 4.22–5.81)
RDW: 21.2 % — ABNORMAL HIGH (ref 11.5–15.5)
WBC: 8.9 10*3/uL (ref 4.0–10.5)
nRBC: 0 /100{WBCs}
nRBC: 0.7 % — ABNORMAL HIGH (ref 0.0–0.2)

## 2023-08-17 NOTE — Plan of Care (Signed)
  Problem: Clinical Measurements: Goal: Ability to maintain clinical measurements within normal limits will improve Outcome: Progressing Goal: Will remain free from infection Outcome: Progressing Goal: Diagnostic test results will improve Outcome: Progressing Goal: Respiratory complications will improve Outcome: Progressing Goal: Cardiovascular complication will be avoided Outcome: Progressing   Problem: Nutrition: Goal: Adequate nutrition will be maintained Outcome: Progressing   Problem: Safety: Goal: Ability to remain free from injury will improve Outcome: Progressing   Problem: Pain Managment: Goal: General experience of comfort will improve and/or be controlled Outcome: Progressing   Problem: Elimination: Goal: Will not experience complications related to bowel motility Outcome: Progressing Goal: Will not experience complications related to urinary retention Outcome: Progressing   Problem: Skin Integrity: Goal: Risk for impaired skin integrity will decrease Outcome: Progressing

## 2023-08-17 NOTE — Progress Notes (Signed)
 [facility] Progress Note   Assessment/ Plan:   # Oliguric AKI on CKD 3a - b/l creat 1.1- 1.5 from 2024. Creat here 5.8 on presentation. AKI due to myeloma kidney +/- hyperCa +/- ACEi. CRRT from 4/23--- 5/10.  on iHD now.   dialysis 5/22 following placement  tunneled HD catheter, also on 5/24-  - HD  5/26 d/t catabolic state - next HD Wednesday.  Hoping to stay on MWF schedule to optimize chemo schedule - increased time to 3.75 hrs d/t sig azotemia, will increase to 4 hrs on Monday - HD 5/30 and 5/31, next HD Monday after that  # AMS/acute metabolic encephalopathy: MRI, LP completed and unrevealing.  Waxes and wanes    #Volume -   Gentle UF moving forward   # Hypotension: normotensive,    # Multiple myeloma: started on Velcade  and Cytoxan ,  Per onc   #AHRF/ aspergillus PNA/ s/p trach 5/8: on voriconazole  IV, off all other IV antibiotics. Off the ventilator on trach collar, per CCM   # Atrial fib-  on amio   #Anemia - prn transfusions. On epogen  per onc.      Subjective:   Extra HD yesterday- BUN finally below 100.  Appears a little more alert today   Objective:   BP 112/82 (BP Location: Left Arm)   Pulse 100   Temp 99.6 F (37.6 C) (Axillary)   Resp 20   Ht 6' 0.01" (1.829 m)   Wt 95.3 kg   SpO2 98%   BMI 28.49 kg/m   Intake/Output Summary (Last 24 hours) at 08/17/2023 0940 Last data filed at 08/17/2023 0523 Gross per 24 hour  Intake 130 ml  Output 1185 ml  Net -1055 ml   Weight change: 1.1 kg  Physical Exam: Gen: NAD CVS: RRR Resp: on TC Abd: soft Ext: no LE edema ACCESS: TDC  Imaging: No results found.    Labs: BMET Recent Labs  Lab 08/11/23 1059 08/12/23 0525 08/13/23 0531 08/14/23 0436 08/15/23 0809 08/15/23 0811 08/16/23 0417 08/17/23 0351  NA 137  137 132* 131* 131* 132* 134* 131* 131*  K 4.8  4.9 4.2 5.2* 5.5* 5.0 5.1 4.6 4.8  CL 99  99 97* 100 95* 94* 95* 91* 93*  CO2 21*  20* 23 20* 21* 21* 21* 23 24  GLUCOSE 213*   218* 185* 169* 178* 122* 120* 163* 127*  BUN 166*  170* 117* 170* 118* 165* 166* 128* 95*  CREATININE 3.60*  3.61* 2.64* 3.50* 2.88* 4.18* 4.13* 3.23* 2.63*  CALCIUM  8.0*  8.2* 8.7* 7.9* 8.2* 8.2* 8.3* 8.1* 8.0*  PHOS 6.8*  --  RESULTS UNAVAILABLE DUE TO INTERFERING SUBSTANCE RESULTS UNAVAILABLE DUE TO INTERFERING SUBSTANCE  --  7.5* 6.6* 5.8*   CBC Recent Labs  Lab 08/14/23 1043 08/15/23 0809 08/16/23 0417 08/17/23 0359  WBC 8.4 8.0 8.5 8.9  NEUTROABS 6.6 6.1 6.9 7.7  HGB 10.7* 10.3* 9.8* 10.4*  HCT 33.8* 32.1* 30.5* 33.0*  MCV 99.7 99.4 100.0 102.2*  PLT 207 215 169 170    Medications:     acyclovir   200 mg Per Tube BID   arformoterol   15 mcg Nebulization BID   collagenase    Topical Daily   feeding supplement (OSMOLITE 1.5 CAL)  355 mL Per Tube TID PC & HS   feeding supplement (PROSource TF20)  60 mL Per Tube BID   fiber  1 packet Per Tube BID   folic acid   1 mg Per Tube Daily  heparin  injection (subcutaneous)  5,000 Units Subcutaneous Q8H   insulin  aspart  0-15 Units Subcutaneous TID AC & HS   insulin  aspart  2 Units Subcutaneous TID AC & HS   insulin  glargine-yfgn  10 Units Subcutaneous BID   multivitamin  1 tablet Per Tube QHS   nicotine   7 mg Transdermal Daily   nutrition supplement (JUVEN)  1 packet Per Tube BID BM   mouth rinse  15 mL Mouth Rinse 4 times per day   PARoxetine   40 mg Per Tube Daily   pneumococcal 20-valent conjugate vaccine  0.5 mL Intramuscular Tomorrow-1000   revefenacin   175 mcg Nebulization Daily   sodium chloride  flush  10-40 mL Intracatheter Q12H   sodium chloride  flush  10-40 mL Intracatheter Q12H   thiamine   100 mg Per Tube Daily   voriconazole   350 mg Per Tube Q12H    Leandra Pro, MD 08/17/2023, 9:40 AM

## 2023-08-17 NOTE — Progress Notes (Addendum)
 PROGRESS NOTE  Frank [name]. WUJ:[id number] DOB: 04/22/1965   PCP: Serita Danes, MD  Patient is from: Home  DOA: 07/03/2023 LOS: 45  Chief complaints No chief complaint on file.    Brief Narrative / Interim history: 58 year old M with PMH of OSA on CPAP, pathological fracture and multiple ORIF left humerus likely due to multiple myeloma, CKD-3A, A-fib not on AC, asthma, HTN, HLD and tobacco use disorder who had been feeling weak for about 1 week, admitted to [facility] with concern for sepsis, community-acquired pneumonia and acute encephalopathy.  Patient was found to have lytic lesions, he was transferred to [facility] for oncology treatment.  In the meantime, he developed encephalopathy, acute renal failure and remained very sick.  He remained in the ICU for 36 days.  Underwent tracheostomy. Deemed to be not stable for PEG tube placement.  He has been on tube feeds via cortrack.  Also started on dialysis after CRRT.   Significant events 4/17-admitted to medical floor with oncology consultation to treat multiple myeloma. 4/19-transferred to ICU intubated, encephalopathic. 4/20-A-fib with RVR.  Started heparin . Groin and biopsy site hematoma requiring multiple transfusions EEG 4/25 >> moderate to severe encephalopathy without any seizures or epileptiform discharges 4/29 Received Velcade  and Cytoxan  chemotherapy-intent palliative? 5/2 LP and ETT exchange  5/8 Trach placed. Some post op bleeding improved with thrombi-pad 5/10 voriconazole  added back based on CT Chest findings 5/13 more alert and able to follow some commands. 5/23, transferred to floor. 5/29-PEG placed 5/31 chemotherapy changed to Sarclisa/Kyprolis/Cytoxan .   Subjective: Seen and examined earlier this morning.  No major events overnight or this morning.  Sleepy but wakes to voice.  Goes back to sleep quickly.  Objective: Vitals:   08/17/23 0522 08/17/23 0733 08/17/23 1127 08/17/23 1200  BP:   112/82  102/74  Pulse:  100 98   Resp:  20 (!) 21 20  Temp:  99.6 F (37.6 C)  99.3 F (37.4 C)  TempSrc:  Axillary  Axillary  SpO2:  98% 96%   Weight: 95.3 kg     Height:        Examination:  GENERAL: No apparent distress.  Nontoxic. HEENT: MMM.  Vision and hearing grossly intact.  Cortrack. NECK: Tracheostomy in place. RESP:  No IWOB.  Rhonchi bilaterally. CVS: HR in 90's  Heart sounds normal.  ABD/GI/GU: BS+. Abd soft, NTND.  G-tube and rectal tube in place. MSK/EXT: Moves right arm.  Left arm in brace.  Minimally moves BLE.  No edema.  NEURO: Sleepy but awakes to voice.  Goes back to sleep quickly.  Not able to assess orientation due to speech difficulty in the setting of trach.  PSYCH: Calm.  No distress or agitation.  Consultants:  Neurology Nephrology Oncology Interventional radiology Infectious disease Palliative   Microbiology summarized: 4/28 BCx > no growth, final 4/30 respiratory culture-few PMN, rare budding yeast, abundant GPC> few aspergillus fumigatus 5/2-Quantiferon  neg 5/4-trach asp  rare GPC, rare mold  5/5-PCP smear trach asp neg  5/9-trach asp abundant staph hemolyticus, few budding yeast 5/11-blood cultures negative 5/15-trach aspirate negative   CSF studies 1 RBC, 1 WBC, glucose 106 CSF culture-2 organisms acute, rare WBC> NGTD Cryptococcal antigen negative Meningitis and encephalitis panel negative VDRL  non-reactive   Assessment and plan: Acute hypoxic respiratory failure and respiratory insufficiency status post tracheostomy:  -Pulmonology following intermittently I recommended keeping trach collar.  On #6 cuffed trach -Continue chest physiotherapy  Severe acute metabolic encephalopathy with hypoactive  delirium, seizure disorder: Normal MRI brain.  LP negative for meningitis or encephalitis.  EEG negative for seizure but encephalopathy.  Home Depakote  discontinued in ICU but resume later.  BUN elevated to 110s likely from tube feed.   Tremor and agitation reportedly improved with Klonopin  but has not been receiving Klonopin  often.  Currently sleepy -Reorientation and delirium precaution -Discontinued Klonopin  on 5/30. -Depacon  resumed on 5/25  Multiple myeloma: Initially started on Velcade  and Cytoxan .  Chemotherapy changed to Sarclisa/Kyprolis/Cytoxan  on 5/31.   - Oncology following.  Recurrent fever/SIRS: Continues to spike fever with some tachycardia and tachypnea.  Normotensive.  Likely paraneoplastic.  He is also at risk for aspiration pneumonitis but respiratory status stable. -Symptomatic management   Aspergillosis with pneumonia:  -On voriconazole  per ID.  ID to decide duration.  -Also on acyclovir .  NIDDM-2 with hyperglycemia: A1c 6.0% (was 6.7% in 01/2023) Recent Labs  Lab 08/16/23 0845 08/16/23 1515 08/16/23 2111 08/17/23 0607 08/17/23 1218  GLUCAP 72 138* 208* 130* 209*  -Continue Semglee  10 units twice daily on 5/28 -Decrease SSI from resistant to moderate -Decrease NovoLog  from 4 to 2 units every 4 hours -Further adjustment as appropriate.  AKI on CKD-3A/azotemia/hyponatremia/hyperkalemia: Due to multiple myeloma, hypercalcemia and ACE inhibitors?  -S/p CRRT while in ICU.   -Tunneled HD placed on 5/22.  Now on HD -Lokelma  10 g twice daily x 2 doses. -Nephrology managing   Paroxysmal A-fib: Now in sinus rhythm.  Rate controlled without meds.   Unable to anticoagulate due to hematoma and intolerance.   Obstructive sleep apnea: Now with trach collar.  Anxiety/bipolar disorder - Continue home Paxil  - Continue Depakote    Goal of care: Poor long-term prognosis.  Palliative from prior providers had discussion with patient's wife who is an EMT.  Remains full code with full scope of care.   Severe malnutrition Body mass index is 28.49 kg/m. Nutrition Problem: Severe Malnutrition Etiology: acute illness (prolonged illness, interuptions in feeding) Signs/Symptoms: moderate fat depletion,  moderate muscle depletion, percent weight loss (9.6% x 1 month) Percent weight loss: 9.6 % Interventions: Refer to RD note for recommendations, Tube feeding  Pressure skin injury: Pressure Injury 07/05/23 Heel Left;Right Unstageable - Full thickness tissue loss in which the base of the injury is covered by slough (yellow, tan, gray, green or brown) and/or eschar (tan, brown or black) in the wound bed. (Active)  07/05/23 1108  Location: Heel  Location Orientation: Left;Right  Staging: Unstageable - Full thickness tissue loss in which the base of the injury is covered by slough (yellow, tan, gray, green or brown) and/or eschar (tan, brown or black) in the wound bed.  Wound Description (Comments):   Present on Admission: Yes  Dressing Type Foam - Lift dressing to assess site every shift 08/16/23 2000     Pressure Injury 07/11/23 Buttocks Mid Unstageable - Full thickness tissue loss in which the base of the injury is covered by slough (yellow, tan, gray, green or brown) and/or eschar (tan, brown or black) in the wound bed. 8 cm x 6 cmx 0.1 cm (Active)  07/11/23 1600  Location: Buttocks  Location Orientation: Mid  Staging: Unstageable - Full thickness tissue loss in which the base of the injury is covered by slough (yellow, tan, gray, green or brown) and/or eschar (tan, brown or black) in the wound bed.  Wound Description (Comments): 8 cm x 6 cmx 0.1 cm  Present on Admission: No  Dressing Type Foam - Lift dressing to assess site every shift 08/16/23  2000     Pressure Injury 07/27/23 Buttocks Right;Lower (Active)  07/27/23 1700  Location: Buttocks  Location Orientation: Right;Lower  Staging:   Wound Description (Comments):   Present on Admission: Yes  Dressing Type Foam - Lift dressing to assess site every shift 08/16/23 2000   DVT prophylaxis:  heparin  injection 5,000 Units Start: 08/14/23 2200 Place and maintain sequential compression device Start: 07/14/23 1033  Code Status: Full  code Family Communication: None at bedside. Level of care: Progressive Status is: Inpatient Remains inpatient appropriate because: Respiratory failure, encephalopathy, renal failure   Final disposition: LTAC   35 minutes with more than 50% spent in reviewing records, counseling patient/family and coordinating care.   Sch Meds:  Scheduled Meds:  acyclovir   200 mg Per Tube BID   arformoterol   15 mcg Nebulization BID   collagenase    Topical Daily   feeding supplement (OSMOLITE 1.5 CAL)  355 mL Per Tube TID PC & HS   feeding supplement (PROSource TF20)  60 mL Per Tube BID   fiber  1 packet Per Tube BID   folic acid   1 mg Per Tube Daily   heparin  injection (subcutaneous)  5,000 Units Subcutaneous Q8H   insulin  aspart  0-15 Units Subcutaneous TID AC & HS   insulin  aspart  2 Units Subcutaneous TID AC & HS   insulin  glargine-yfgn  10 Units Subcutaneous BID   multivitamin  1 tablet Per Tube QHS   nicotine   7 mg Transdermal Daily   nutrition supplement (JUVEN)  1 packet Per Tube BID BM   mouth rinse  15 mL Mouth Rinse 4 times per day   PARoxetine   40 mg Per Tube Daily   pneumococcal 20-valent conjugate vaccine  0.5 mL Intramuscular Tomorrow-1000   revefenacin   175 mcg Nebulization Daily   sodium chloride  flush  10-40 mL Intracatheter Q12H   sodium chloride  flush  10-40 mL Intracatheter Q12H   thiamine   100 mg Per Tube Daily   voriconazole   350 mg Per Tube Q12H   Continuous Infusions:  valproate sodium  750 mg (08/17/23 0414)   PRN Meds:.acetaminophen  (TYLENOL ) oral liquid 160 mg/5 mL **OR** acetaminophen , hydrALAZINE , iohexol , ipratropium-albuterol , ondansetron  (ZOFRAN ) IV, mouth rinse, oxyCODONE , polyvinyl alcohol , sodium chloride  flush, sodium chloride  flush  Antimicrobials: Anti-infectives (From admission, onward)    Start     Dose/Rate Route Frequency Ordered Stop   08/14/23 0600  ceFAZolin  (ANCEF ) IVPB 2g/100 mL premix        2 g 200 mL/hr over 30 Minutes Intravenous To  Radiology 08/13/23 1055 08/14/23 0606   08/09/23 1000  acyclovir  (ZOVIRAX ) 200 MG capsule 200 mg       Note to Pharmacy: Give after dialysis the pm dose, on dialysis days   200 mg Per Tube 2 times daily 08/08/23 2358     08/09/23 0100  acyclovir  (ZOVIRAX ) 200 MG capsule 200 mg       Note to Pharmacy: Give after dialysis the pm dose, on dialysis days   200 mg Per Tube  Once 08/09/23 0009 08/09/23 0013   08/08/23 2345  acyclovir  (ZOVIRAX ) 200 MG capsule 200 mg  Status:  Discontinued       Note to Pharmacy: Give after dialysis the pm dose, on dialysis days   200 mg Oral 2 times daily 08/08/23 2257 08/08/23 2358   08/07/23 1530  ceFAZolin  (ANCEF ) IVPB 2g/100 mL premix        over 30 Minutes Intravenous Continuous PRN 08/07/23 1555 08/07/23 1530   08/05/23  2200  voriconazole  (VFEND ) tablet 350 mg        350 mg Per Tube Every 12 hours 08/05/23 1511     08/01/23 1200  vancomycin  (VANCOCIN ) IVPB 1000 mg/200 mL premix        1,000 mg 200 mL/hr over 60 Minutes Intravenous Every M-W-F (Hemodialysis) 07/31/23 0649 08/01/23 1652   07/30/23 1200  vancomycin  (VANCOCIN ) IVPB 1000 mg/200 mL premix        1,000 mg 200 mL/hr over 60 Minutes Intravenous Every M-W-F (Hemodialysis) 07/30/23 0851 07/30/23 1241   07/29/23 0900  vancomycin  (VANCOCIN ) IVPB 1000 mg/200 mL premix        1,000 mg 200 mL/hr over 60 Minutes Intravenous  Once 07/29/23 0812 07/29/23 0943   07/28/23 1204  vancomycin  variable dose per unstable renal function (pharmacist dosing)  Status:  Discontinued         Does not apply See admin instructions 07/28/23 1204 07/31/23 0649   07/28/23 1000  voriconazole  (VFEND ) 350 mg in sodium chloride  0.9 % 100 mL IVPB  Status:  Discontinued       Placed in "Followed by" Linked Group   4 mg/kg  88.6 kg (Adjusted) 67.5 mL/hr over 120 Minutes Intravenous Every 12 hours 07/28/23 0748 08/05/23 1511   07/27/23 1000  voriconazole  (VFEND ) 400 mg in sodium chloride  0.9 % 100 mL IVPB  Status:  Discontinued        Placed in "Followed by" Linked Group   4 mg/kg  100.6 kg 70 mL/hr over 120 Minutes Intravenous Every 12 hours 07/26/23 0826 07/28/23 0748   07/27/23 0945  vancomycin  (VANCOREADY) IVPB 2000 mg/400 mL        2,000 mg 200 mL/hr over 120 Minutes Intravenous  Once 07/27/23 0849 07/27/23 1237   07/26/23 1515  Ampicillin -Sulbactam (UNASYN ) 3 g in sodium chloride  0.9 % 100 mL IVPB  Status:  Discontinued        3 g 200 mL/hr over 30 Minutes Intravenous Every 8 hours 07/26/23 1418 07/27/23 0830   07/26/23 0915  voriconazole  (VFEND ) 600 mg in sodium chloride  0.9 % 150 mL IVPB       Placed in "Followed by" Linked Group   6 mg/kg  100.6 kg 105 mL/hr over 120 Minutes Intravenous Every 12 hours 07/26/23 0826 07/27/23 0048   07/26/23 0900  Ampicillin -Sulbactam (UNASYN ) 3 g in sodium chloride  0.9 % 100 mL IVPB  Status:  Discontinued        3 g 200 mL/hr over 30 Minutes Intravenous Every 12 hours 07/26/23 0826 07/26/23 1418   07/23/23 1000  levofloxacin  (LEVAQUIN ) IVPB 500 mg  Status:  Discontinued       Placed in "Followed by" Linked Group   500 mg 100 mL/hr over 60 Minutes Intravenous Every 24 hours 07/22/23 1213 07/23/23 1001   07/23/23 1000  levofloxacin  (LEVAQUIN ) IVPB 500 mg  Status:  Discontinued       Placed in "Followed by" Linked Group   500 mg 100 mL/hr over 60 Minutes Intravenous Every 24 hours 07/23/23 1001 07/28/23 0804   07/22/23 1300  levofloxacin  (LEVAQUIN ) IVPB 750 mg       Placed in "Followed by" Linked Group   750 mg 100 mL/hr over 90 Minutes Intravenous  Once 07/22/23 1213 07/22/23 1542   07/21/23 1600  voriconazole  (VFEND ) 300 mg in sodium chloride  0.9 % 100 mL IVPB  Status:  Discontinued        300 mg 65 mL/hr over 120 Minutes Intravenous Every 12 hours  07/21/23 1430 07/22/23 1415   07/21/23 1000  voriconazole  (VFEND ) tablet 200 mg  Status:  Discontinued        200 mg Oral Every 12 hours 07/20/23 0944 07/21/23 1015   07/20/23 1030  voriconazole  (VFEND ) 530 mg in sodium  chloride 0.9 % 150 mL IVPB        6 mg/kg  88.6 kg (Adjusted) 101.5 mL/hr over 120 Minutes Intravenous Every 12 hours 07/20/23 0944 07/21/23 0030   07/19/23 1400  meropenem  (MERREM ) 1 g in sodium chloride  0.9 % 100 mL IVPB  Status:  Discontinued        1 g 200 mL/hr over 30 Minutes Intravenous Every 8 hours 07/19/23 1143 07/22/23 1213   07/18/23 0600  meropenem  (MERREM ) 1 g in sodium chloride  0.9 % 100 mL IVPB  Status:  Discontinued        1 g 200 mL/hr over 30 Minutes Intravenous Every 24 hours 07/17/23 0916 07/19/23 1143   07/17/23 0700  meropenem  (MERREM ) 1 g in sodium chloride  0.9 % 100 mL IVPB  Status:  Discontinued        1 g 200 mL/hr over 30 Minutes Intravenous Every 12 hours 07/17/23 0601 07/17/23 0916   07/15/23 1300  vancomycin  (VANCOCIN ) IVPB 1000 mg/200 mL premix  Status:  Discontinued        1,000 mg 200 mL/hr over 60 Minutes Intravenous Every 24 hours 07/14/23 1129 07/22/23 1319   07/14/23 1215  vancomycin  (VANCOREADY) IVPB 1750 mg/350 mL        1,750 mg 175 mL/hr over 120 Minutes Intravenous  Once 07/14/23 1129 07/14/23 1449   07/09/23 1800  piperacillin -tazobactam (ZOSYN ) IVPB 3.375 g  Status:  Discontinued        3.375 g 12.5 mL/hr over 240 Minutes Intravenous Every 12 hours 07/09/23 0756 07/09/23 0844   07/09/23 1400  piperacillin -tazobactam (ZOSYN ) IVPB 3.375 g  Status:  Discontinued        3.375 g 12.5 mL/hr over 240 Minutes Intravenous Every 8 hours 07/09/23 0844 07/17/23 0557   07/08/23 1300  piperacillin -tazobactam (ZOSYN ) IVPB 3.375 g  Status:  Discontinued        3.375 g 12.5 mL/hr over 240 Minutes Intravenous Every 8 hours 07/08/23 1129 07/09/23 0756   07/05/23 2200  azithromycin  (ZITHROMAX ) 500 mg in sodium chloride  0.9 % 250 mL IVPB        500 mg 250 mL/hr over 60 Minutes Intravenous Daily at bedtime 07/05/23 2022 07/06/23 2302   07/05/23 2115  azithromycin  (ZITHROMAX ) 250 mg in dextrose  5 % 125 mL IVPB  Status:  Discontinued       Note to Pharmacy: Missed  dose this morning   250 mg 127.5 mL/hr over 60 Minutes Intravenous Every 24 hours 07/05/23 2016 07/05/23 2021   07/04/23 0900  fluconazole  (DIFLUCAN ) IVPB 200 mg  Status:  Discontinued        200 mg 100 mL/hr over 60 Minutes Intravenous Every 48 hours 07/04/23 0722 07/09/23 0809   07/03/23 0600  cefTRIAXone  (ROCEPHIN ) 1 g in sodium chloride  0.9 % 100 mL IVPB  Status:  Discontinued        1 g 200 mL/hr over 30 Minutes Intravenous Every 24 hours 07/03/23 0508 07/08/23 1104   07/03/23 0600  azithromycin  (ZITHROMAX ) tablet 250 mg  Status:  Discontinued        250 mg Oral Daily 07/03/23 0508 07/05/23 2016        I have personally reviewed the following labs and images:  CBC: Recent Labs  Lab 08/13/23 0531 08/14/23 1043 08/15/23 0809 08/16/23 0417 08/17/23 0359  WBC 8.8 8.4 8.0 8.5 8.9  NEUTROABS 7.1 6.6 6.1 6.9 7.7  HGB 9.7* 10.7* 10.3* 9.8* 10.4*  HCT 31.1* 33.8* 32.1* 30.5* 33.0*  MCV 101.3* 99.7 99.4 100.0 102.2*  PLT 252 207 215 169 170   BMP &GFR Recent Labs  Lab 08/11/23 1059 08/12/23 0525 08/13/23 0531 08/14/23 0436 08/15/23 0809 08/15/23 0811 08/16/23 0417 08/17/23 0351  NA 137  137   < > 131* 131* 132* 134* 131* 131*  K 4.8  4.9   < > 5.2* 5.5* 5.0 5.1 4.6 4.8  CL 99  99   < > 100 95* 94* 95* 91* 93*  CO2 21*  20*   < > 20* 21* 21* 21* 23 24  GLUCOSE 213*  218*   < > 169* 178* 122* 120* 163* 127*  BUN 166*  170*   < > 170* 118* 165* 166* 128* 95*  CREATININE 3.60*  3.61*   < > 3.50* 2.88* 4.18* 4.13* 3.23* 2.63*  CALCIUM  8.0*  8.2*   < > 7.9* 8.2* 8.2* 8.3* 8.1* 8.0*  MG 2.7*  --   --   --   --   --   --   --   PHOS 6.8*  --  RESULTS UNAVAILABLE DUE TO INTERFERING SUBSTANCE RESULTS UNAVAILABLE DUE TO INTERFERING SUBSTANCE  --  7.5* 6.6* 5.8*   < > = values in this interval not displayed.   Estimated Creatinine Clearance: 36.2 mL/min (A) (by C-G formula based on SCr of 2.63 mg/dL (H)). Liver & Pancreas: Recent Labs  Lab 08/11/23 1059 08/12/23 0525  08/13/23 0531 08/14/23 0436 08/15/23 0809 08/15/23 0811 08/16/23 0417 08/17/23 0351  AST 14* 13*  --   --  16  --  13* 13*  ALT 10 11  --   --  11  --  7 5  ALKPHOS 45 43  --   --  42  --  40 45  BILITOT 0.8 0.9  --   --  0.6  --  0.5 0.7  PROT 8.2* 8.5*  --   --  8.0  --  7.9 8.1  ALBUMIN  2.3*  2.3* 2.4*   < > 2.6* 2.4* 2.4* 2.5* 2.6*   < > = values in this interval not displayed.   No results for input(s): "LIPASE", "AMYLASE" in the last 168 hours. Recent Labs  Lab 08/12/23 2124  AMMONIA 14   Diabetic: No results for input(s): "HGBA1C" in the last 72 hours.  Recent Labs  Lab 08/16/23 0845 08/16/23 1515 08/16/23 2111 08/17/23 0607 08/17/23 1218  GLUCAP 72 138* 208* 130* 209*   Cardiac Enzymes: No results for input(s): "CKTOTAL", "CKMB", "CKMBINDEX", "TROPONINI" in the last 168 hours. No results for input(s): "PROBNP" in the last 8760 hours. Coagulation Profile: Recent Labs  Lab 08/13/23 0908  INR 1.0   Thyroid Function Tests: No results for input(s): "TSH", "T4TOTAL", "FREET4", "T3FREE", "THYROIDAB" in the last 72 hours. Lipid Profile: No results for input(s): "CHOL", "HDL", "LDLCALC", "TRIG", "CHOLHDL", "LDLDIRECT" in the last 72 hours. Anemia Panel: No results for input(s): "VITAMINB12", "FOLATE", "FERRITIN", "TIBC", "IRON", "RETICCTPCT" in the last 72 hours. Urine analysis:    Component Value Date/Time   COLORURINE STRAW (A) 07/05/2023 1912   APPEARANCEUR CLEAR 07/05/2023 1912   LABSPEC 1.008 07/05/2023 1912   PHURINE 7.0 07/05/2023 1912   GLUCOSEU NEGATIVE 07/05/2023 1912   HGBUR MODERATE (A)  07/05/2023 1912   BILIRUBINUR NEGATIVE 07/05/2023 1912   KETONESUR NEGATIVE 07/05/2023 1912   PROTEINUR 30 (A) 07/05/2023 1912   NITRITE NEGATIVE 07/05/2023 1912   LEUKOCYTESUR NEGATIVE 07/05/2023 1912   Sepsis Labs: Invalid input(s): "PROCALCITONIN", "LACTICIDVEN"  Microbiology: No results found for this or any previous visit (from the past 240  hours).  Radiology Studies: No results found.     Clois Montavon T. Joshlyn Beadle [facility]  If 7PM-7AM, please contact night-coverage [email or web address] 08/17/2023, 2:35 PM

## 2023-08-18 DIAGNOSIS — R531 Weakness: Secondary | ICD-10-CM | POA: Diagnosis not present

## 2023-08-18 DIAGNOSIS — J9601 Acute respiratory failure with hypoxia: Secondary | ICD-10-CM | POA: Diagnosis not present

## 2023-08-18 DIAGNOSIS — G9341 Metabolic encephalopathy: Secondary | ICD-10-CM | POA: Diagnosis not present

## 2023-08-18 DIAGNOSIS — N179 Acute kidney failure, unspecified: Secondary | ICD-10-CM | POA: Diagnosis not present

## 2023-08-18 LAB — COMPREHENSIVE METABOLIC PANEL WITH GFR
ALT: 8 U/L (ref 0–44)
AST: 13 U/L — ABNORMAL LOW (ref 15–41)
Albumin: 2.4 g/dL — ABNORMAL LOW (ref 3.5–5.0)
Alkaline Phosphatase: 40 U/L (ref 38–126)
Anion gap: 16 — ABNORMAL HIGH (ref 5–15)
BUN: 149 mg/dL — ABNORMAL HIGH (ref 6–20)
CO2: 23 mmol/L (ref 22–32)
Calcium: 8.2 mg/dL — ABNORMAL LOW (ref 8.9–10.3)
Chloride: 93 mmol/L — ABNORMAL LOW (ref 98–111)
Creatinine, Ser: 3.59 mg/dL — ABNORMAL HIGH (ref 0.61–1.24)
GFR, Estimated: 19 mL/min — ABNORMAL LOW (ref >60)
Glucose, Bld: 121 mg/dL — ABNORMAL HIGH (ref 70–99)
Potassium: 5.2 mmol/L — ABNORMAL HIGH (ref 3.5–5.1)
Sodium: 132 mmol/L — ABNORMAL LOW (ref 135–145)
Total Bilirubin: 0.7 mg/dL (ref 0.0–1.2)
Total Protein: 7.5 g/dL (ref 6.5–8.1)

## 2023-08-18 LAB — CBC WITH DIFFERENTIAL/PLATELET
Abs Immature Granulocytes: 0.06 10*3/uL (ref 0.00–0.07)
Basophils Absolute: 0 10*3/uL (ref 0.0–0.1)
Basophils Relative: 0 %
Eosinophils Absolute: 0 10*3/uL (ref 0.0–0.5)
Eosinophils Relative: 0 %
HCT: 30.8 % — ABNORMAL LOW (ref 39.0–52.0)
Hemoglobin: 9.7 g/dL — ABNORMAL LOW (ref 13.0–17.0)
Immature Granulocytes: 1 %
Lymphocytes Relative: 9 %
Lymphs Abs: 0.7 10*3/uL (ref 0.7–4.0)
MCH: 32 pg (ref 26.0–34.0)
MCHC: 31.5 g/dL (ref 30.0–36.0)
MCV: 101.7 fL — ABNORMAL HIGH (ref 80.0–100.0)
Monocytes Absolute: 0.5 10*3/uL (ref 0.1–1.0)
Monocytes Relative: 6 %
Neutro Abs: 7.1 10*3/uL (ref 1.7–7.7)
Neutrophils Relative %: 84 %
Platelets: 177 10*3/uL (ref 150–400)
RBC: 3.03 MIL/uL — ABNORMAL LOW (ref 4.22–5.81)
RDW: 21.4 % — ABNORMAL HIGH (ref 11.5–15.5)
WBC: 8.4 10*3/uL (ref 4.0–10.5)
nRBC: 0 % (ref 0.0–0.2)

## 2023-08-18 LAB — RENAL FUNCTION PANEL
Albumin: 2.4 g/dL — ABNORMAL LOW (ref 3.5–5.0)
Anion gap: 16 — ABNORMAL HIGH (ref 5–15)
BUN: 150 mg/dL — ABNORMAL HIGH (ref 6–20)
CO2: 23 mmol/L (ref 22–32)
Calcium: 8.2 mg/dL — ABNORMAL LOW (ref 8.9–10.3)
Chloride: 92 mmol/L — ABNORMAL LOW (ref 98–111)
Creatinine, Ser: 3.51 mg/dL — ABNORMAL HIGH (ref 0.61–1.24)
GFR, Estimated: 19 mL/min — ABNORMAL LOW (ref >60)
Glucose, Bld: 121 mg/dL — ABNORMAL HIGH (ref 70–99)
Phosphorus: 7.1 mg/dL — ABNORMAL HIGH (ref 2.5–4.6)
Potassium: 5.2 mmol/L — ABNORMAL HIGH (ref 3.5–5.1)
Sodium: 131 mmol/L — ABNORMAL LOW (ref 135–145)

## 2023-08-18 LAB — GLUCOSE, CAPILLARY
Glucose-Capillary: 114 mg/dL — ABNORMAL HIGH (ref 70–99)
Glucose-Capillary: 159 mg/dL — ABNORMAL HIGH (ref 70–99)
Glucose-Capillary: 332 mg/dL — ABNORMAL HIGH (ref 70–99)
Glucose-Capillary: 173 mg/dL — ABNORMAL HIGH (ref 70–99)

## 2023-08-18 LAB — LACTATE DEHYDROGENASE: LDH: 238 U/L — ABNORMAL HIGH (ref 98–192)

## 2023-08-18 MED ORDER — HEPARIN SODIUM (PORCINE) 1000 UNIT/ML IJ SOLN
INTRAMUSCULAR | Status: AC
  Filled 2023-08-18: qty 4

## 2023-08-18 MED ORDER — NUTRISOURCE FIBER PO PACK
1.0000 | PACK | Freq: Two times a day (BID) | ORAL | Status: DC
  Administered 2023-08-18 – 2023-08-31 (×28): 1
  Filled 2023-08-18 (×32): qty 1

## 2023-08-18 MED ORDER — SODIUM ZIRCONIUM CYCLOSILICATE 10 G PO PACK
10.0000 g | PACK | Freq: Once | ORAL | Status: AC
  Administered 2023-08-18: 10 g
  Filled 2023-08-18: qty 1

## 2023-08-18 NOTE — Progress Notes (Signed)
 Patient not available for trach change for most of the day due to being in HD. Will attempt to change tomorrow. RT will continue to monitor.

## 2023-08-18 NOTE — Progress Notes (Signed)
 PROGRESS NOTE  Frank [name]. UXL:[id number] DOB: 1965/12/08   PCP: Serita Danes, MD  Patient is from: Home  DOA: 07/03/2023 LOS: 46  Chief complaints No chief complaint on file.    Brief Narrative / Interim history: 58 year old M with PMH of OSA on CPAP, pathological fracture and multiple ORIF left humerus likely due to multiple myeloma, CKD-3A, A-fib not on AC, asthma, HTN, HLD and tobacco use disorder who had been feeling weak for about 1 week, admitted to [facility] with concern for sepsis, community-acquired pneumonia and acute encephalopathy.  Patient was found to have lytic lesions, he was transferred to [facility] for oncology treatment.  In the meantime, he developed encephalopathy, acute renal failure and remained very sick.  He remained in the ICU for 36 days.  Underwent tracheostomy. Deemed to be not stable for PEG tube placement.  He has been on tube feeds via cortrack.  Also started on dialysis after CRRT.   Significant events 4/17-admitted to medical floor with oncology consultation to treat multiple myeloma. 4/19-transferred to ICU intubated, encephalopathic. 4/20-A-fib with RVR.  Started heparin . Groin and biopsy site hematoma requiring multiple transfusions EEG 4/25 >> moderate to severe encephalopathy without any seizures or epileptiform discharges 4/29 Received Velcade  and Cytoxan  chemotherapy-intent palliative? 5/2 LP and ETT exchange  5/8 Trach placed. Some post op bleeding improved with thrombi-pad 5/10 voriconazole  added back based on CT Chest findings 5/13 more alert and able to follow some commands. 5/23, transferred to floor. 5/29-PEG placed 5/31 chemotherapy changed to Sarclisa/Kyprolis/Cytoxan .   Subjective: Seen and examined earlier this morning while on dialysis..  No major events overnight or this morning.  Awake.  Communicates by nodding yes and no.  Nods no to pain.   Objective: Vitals:   08/18/23 1230 08/18/23 1241  08/18/23 1245 08/18/23 1331  BP: (!) 125/103 (!) 130/97 (!) 132/96 (!) (P) 139/98  Pulse: (!) 121 (!) 118 (!) 117 (!) (P) 116  Resp: (!) 25 18 (!) 26 (P) 18  Temp:   99.3 F (37.4 C) (P) 98.7 F (37.1 C)  TempSrc:    (P) Oral  SpO2: 98% 98% 98% (P) 93%  Weight:   91.1 kg   Height:        Examination:  GENERAL: No apparent distress.  Nontoxic. HEENT: MMM.  Vision and hearing grossly intact.  Cortrack. NECK: Tracheostomy in place. RESP:  No IWOB.  Rhonchi bilaterally. CVS: HR 110s.  Heart sounds normal.  ABD/GI/GU: BS+. Abd soft, NTND.  G-tube and rectal tube in place. MSK/EXT: Moves right arm.  Left arm in brace.  Minimally moves BLE.  No edema.  NEURO: Awake and alert.  Follows commands.  Responds by nodding.  Not able to assess orientation due to speech difficulty in the setting of trach.  PSYCH: Calm.  No distress or agitation.  Consultants:  Neurology Nephrology Oncology Interventional radiology Infectious disease Palliative   Microbiology summarized: 4/28 BCx > no growth, final 4/30 respiratory culture-few PMN, rare budding yeast, abundant GPC> few aspergillus fumigatus 5/2-Quantiferon  neg 5/4-trach asp  rare GPC, rare mold  5/5-PCP smear trach asp neg  5/9-trach asp abundant staph hemolyticus, few budding yeast 5/11-blood cultures negative 5/15-trach aspirate negative   CSF studies 1 RBC, 1 WBC, glucose 106 CSF culture-2 organisms acute, rare WBC> NGTD Cryptococcal antigen negative Meningitis and encephalitis panel negative VDRL  non-reactive   Assessment and plan: Acute hypoxic respiratory failure and respiratory insufficiency status post tracheostomy:  -Pulmonology following intermittently I  recommended keeping trach collar.  On #6 cuffed trach -Continue chest physiotherapy  Severe acute metabolic encephalopathy with hypoactive delirium, seizure disorder: Normal MRI brain.  LP negative for meningitis or encephalitis.  EEG negative for seizure but  encephalopathy.  Home Depakote  discontinued in ICU but resume later.  Markedly elevated BUN. -Reorientation and delirium precaution -Discontinued Klonopin  on 5/30. -Depacon  resumed on 5/25 - Hemodialysis for uremia.   Multiple myeloma: Initially started on Velcade  and Cytoxan .  Chemotherapy changed to Sarclisa/Kyprolis/Cytoxan  on 5/31.   - Oncology following.  Recurrent fever/SIRS: Continues to spike fever with some tachycardia and tachypnea.  Normotensive.  Likely paraneoplastic.  He is also at risk for aspiration pneumonitis but respiratory status stable. -Symptomatic management   Aspergillosis with pneumonia:  -On voriconazole  per ID.  ID to decide duration.  -Also on acyclovir .  NIDDM-2 with hyperglycemia: A1c 6.0% (was 6.7% in 01/2023) Recent Labs  Lab 08/17/23 1611 08/17/23 1936 08/17/23 2119 08/18/23 0620 08/18/23 1327  GLUCAP 143* 211* 177* 114* 159*  -Continue Semglee  10 units twice daily on 5/28 -Continue SSI-moderate every 4 hours. -Continue NovoLog  2 units every 4 hours -Further adjustment as appropriate.  AKI on CKD-3A/uremia/hyponatremia/hyperkalemia: Due to MM, hypercalcemia and ACE inhibitors?  -S/p CRRT while in ICU.   -TDC on on 5/22.  Now on HD MWF -Lokelma  10 g twice daily x 1. -Nephrology managing.  Currently on HD.   Paroxysmal A-fib: Now in sinus rhythm.  Rate controlled without meds.   Unable to anticoagulate due to hematoma and intolerance.   Obstructive sleep apnea: Now with trach collar.  Anxiety/bipolar disorder - Continue home Paxil  - Continue Depakote    Goal of care: Poor long-term prognosis.  Palliative from prior providers had discussion with patient's wife who is an EMT.  Remains full code with full scope of care.   Severe malnutrition Body mass index is 27.23 kg/m. Nutrition Problem: Severe Malnutrition Etiology: acute illness (prolonged illness, interuptions in feeding) Signs/Symptoms: moderate fat depletion, moderate muscle  depletion, percent weight loss (9.6% x 1 month) Percent weight loss: 9.6 % Interventions: Refer to RD note for recommendations, Tube feeding  Pressure skin injury: Pressure Injury 07/05/23 Heel Left;Right Unstageable - Full thickness tissue loss in which the base of the injury is covered by slough (yellow, tan, gray, green or brown) and/or eschar (tan, brown or black) in the wound bed. (Active)  07/05/23 1108  Location: Heel  Location Orientation: Left;Right  Staging: Unstageable - Full thickness tissue loss in which the base of the injury is covered by slough (yellow, tan, gray, green or brown) and/or eschar (tan, brown or black) in the wound bed.  Wound Description (Comments):   Present on Admission: Yes  Dressing Type Foam - Lift dressing to assess site every shift 08/18/23 0800     Pressure Injury 07/11/23 Buttocks Mid Unstageable - Full thickness tissue loss in which the base of the injury is covered by slough (yellow, tan, gray, green or brown) and/or eschar (tan, brown or black) in the wound bed. 8 cm x 6 cmx 0.1 cm (Active)  07/11/23 1600  Location: Buttocks  Location Orientation: Mid  Staging: Unstageable - Full thickness tissue loss in which the base of the injury is covered by slough (yellow, tan, gray, green or brown) and/or eschar (tan, brown or black) in the wound bed.  Wound Description (Comments): 8 cm x 6 cmx 0.1 cm  Present on Admission: No  Dressing Type Santyl ;Foam - Lift dressing to assess site every shift 08/18/23 0500  Pressure Injury 07/27/23 Buttocks Right;Lower (Active)  07/27/23 1700  Location: Buttocks  Location Orientation: Right;Lower  Staging:   Wound Description (Comments):   Present on Admission: Yes  Dressing Type Foam - Lift dressing to assess site every shift;Santyl  08/18/23 0500   DVT prophylaxis:  heparin  injection 5,000 Units Start: 08/14/23 2200 Place and maintain sequential compression device Start: 07/14/23 1033  Code Status: Full  code Family Communication: None at bedside. Level of care: Progressive Status is: Inpatient Remains inpatient appropriate because: Respiratory failure, encephalopathy, renal failure   Final disposition: LTAC   35 minutes with more than 50% spent in reviewing records, counseling patient/family and coordinating care.   Sch Meds:  Scheduled Meds:  acyclovir   200 mg Per Tube BID   arformoterol   15 mcg Nebulization BID   collagenase    Topical Daily   feeding supplement (OSMOLITE 1.5 CAL)  355 mL Per Tube TID PC & HS   feeding supplement (PROSource TF20)  60 mL Per Tube BID   fiber  1 packet Per Tube BID   folic acid   1 mg Per Tube Daily   heparin  injection (subcutaneous)  5,000 Units Subcutaneous Q8H   insulin  aspart  0-15 Units Subcutaneous TID AC & HS   insulin  aspart  2 Units Subcutaneous TID AC & HS   insulin  glargine-yfgn  10 Units Subcutaneous BID   multivitamin  1 tablet Per Tube QHS   nicotine   7 mg Transdermal Daily   nutrition supplement (JUVEN)  1 packet Per Tube BID BM   mouth rinse  15 mL Mouth Rinse 4 times per day   PARoxetine   40 mg Per Tube Daily   pneumococcal 20-valent conjugate vaccine  0.5 mL Intramuscular Tomorrow-1000   revefenacin   175 mcg Nebulization Daily   sodium chloride  flush  10-40 mL Intracatheter Q12H   sodium chloride  flush  10-40 mL Intracatheter Q12H   thiamine   100 mg Per Tube Daily   voriconazole   350 mg Per Tube Q12H   Continuous Infusions:  valproate sodium  750 mg (08/17/23 2006)   PRN Meds:.acetaminophen  (TYLENOL ) oral liquid 160 mg/5 mL **OR** acetaminophen , hydrALAZINE , iohexol , ipratropium-albuterol , ondansetron  (ZOFRAN ) IV, mouth rinse, oxyCODONE , artificial tears, sodium chloride  flush, sodium chloride  flush  Antimicrobials: Anti-infectives (From admission, onward)    Start     Dose/Rate Route Frequency Ordered Stop   08/14/23 0600  ceFAZolin  (ANCEF ) IVPB 2g/100 mL premix        2 g 200 mL/hr over 30 Minutes Intravenous To  Radiology 08/13/23 1055 08/14/23 0606   08/09/23 1000  acyclovir  (ZOVIRAX ) 200 MG capsule 200 mg       Note to Pharmacy: Give after dialysis the pm dose, on dialysis days   200 mg Per Tube 2 times daily 08/08/23 2358     08/09/23 0100  acyclovir  (ZOVIRAX ) 200 MG capsule 200 mg       Note to Pharmacy: Give after dialysis the pm dose, on dialysis days   200 mg Per Tube  Once 08/09/23 0009 08/09/23 0013   08/08/23 2345  acyclovir  (ZOVIRAX ) 200 MG capsule 200 mg  Status:  Discontinued       Note to Pharmacy: Give after dialysis the pm dose, on dialysis days   200 mg Oral 2 times daily 08/08/23 2257 08/08/23 2358   08/07/23 1530  ceFAZolin  (ANCEF ) IVPB 2g/100 mL premix        over 30 Minutes Intravenous Continuous PRN 08/07/23 1555 08/07/23 1530   08/05/23 2200  voriconazole  (VFEND ) tablet  350 mg        350 mg Per Tube Every 12 hours 08/05/23 1511     08/01/23 1200  vancomycin  (VANCOCIN ) IVPB 1000 mg/200 mL premix        1,000 mg 200 mL/hr over 60 Minutes Intravenous Every M-W-F (Hemodialysis) 07/31/23 0649 08/01/23 1652   07/30/23 1200  vancomycin  (VANCOCIN ) IVPB 1000 mg/200 mL premix        1,000 mg 200 mL/hr over 60 Minutes Intravenous Every M-W-F (Hemodialysis) 07/30/23 0851 07/30/23 1241   07/29/23 0900  vancomycin  (VANCOCIN ) IVPB 1000 mg/200 mL premix        1,000 mg 200 mL/hr over 60 Minutes Intravenous  Once 07/29/23 0812 07/29/23 0943   07/28/23 1204  vancomycin  variable dose per unstable renal function (pharmacist dosing)  Status:  Discontinued         Does not apply See admin instructions 07/28/23 1204 07/31/23 0649   07/28/23 1000  voriconazole  (VFEND ) 350 mg in sodium chloride  0.9 % 100 mL IVPB  Status:  Discontinued       Placed in "Followed by" Linked Group   4 mg/kg  88.6 kg (Adjusted) 67.5 mL/hr over 120 Minutes Intravenous Every 12 hours 07/28/23 0748 08/05/23 1511   07/27/23 1000  voriconazole  (VFEND ) 400 mg in sodium chloride  0.9 % 100 mL IVPB  Status:  Discontinued        Placed in "Followed by" Linked Group   4 mg/kg  100.6 kg 70 mL/hr over 120 Minutes Intravenous Every 12 hours 07/26/23 0826 07/28/23 0748   07/27/23 0945  vancomycin  (VANCOREADY) IVPB 2000 mg/400 mL        2,000 mg 200 mL/hr over 120 Minutes Intravenous  Once 07/27/23 0849 07/27/23 1237   07/26/23 1515  Ampicillin -Sulbactam (UNASYN ) 3 g in sodium chloride  0.9 % 100 mL IVPB  Status:  Discontinued        3 g 200 mL/hr over 30 Minutes Intravenous Every 8 hours 07/26/23 1418 07/27/23 0830   07/26/23 0915  voriconazole  (VFEND ) 600 mg in sodium chloride  0.9 % 150 mL IVPB       Placed in "Followed by" Linked Group   6 mg/kg  100.6 kg 105 mL/hr over 120 Minutes Intravenous Every 12 hours 07/26/23 0826 07/27/23 0048   07/26/23 0900  Ampicillin -Sulbactam (UNASYN ) 3 g in sodium chloride  0.9 % 100 mL IVPB  Status:  Discontinued        3 g 200 mL/hr over 30 Minutes Intravenous Every 12 hours 07/26/23 0826 07/26/23 1418   07/23/23 1000  levofloxacin  (LEVAQUIN ) IVPB 500 mg  Status:  Discontinued       Placed in "Followed by" Linked Group   500 mg 100 mL/hr over 60 Minutes Intravenous Every 24 hours 07/22/23 1213 07/23/23 1001   07/23/23 1000  levofloxacin  (LEVAQUIN ) IVPB 500 mg  Status:  Discontinued       Placed in "Followed by" Linked Group   500 mg 100 mL/hr over 60 Minutes Intravenous Every 24 hours 07/23/23 1001 07/28/23 0804   07/22/23 1300  levofloxacin  (LEVAQUIN ) IVPB 750 mg       Placed in "Followed by" Linked Group   750 mg 100 mL/hr over 90 Minutes Intravenous  Once 07/22/23 1213 07/22/23 1542   07/21/23 1600  voriconazole  (VFEND ) 300 mg in sodium chloride  0.9 % 100 mL IVPB  Status:  Discontinued        300 mg 65 mL/hr over 120 Minutes Intravenous Every 12 hours 07/21/23 1430 07/22/23 1415  07/21/23 1000  voriconazole  (VFEND ) tablet 200 mg  Status:  Discontinued        200 mg Oral Every 12 hours 07/20/23 0944 07/21/23 1015   07/20/23 1030  voriconazole  (VFEND ) 530 mg in sodium  chloride 0.9 % 150 mL IVPB        6 mg/kg  88.6 kg (Adjusted) 101.5 mL/hr over 120 Minutes Intravenous Every 12 hours 07/20/23 0944 07/21/23 0030   07/19/23 1400  meropenem  (MERREM ) 1 g in sodium chloride  0.9 % 100 mL IVPB  Status:  Discontinued        1 g 200 mL/hr over 30 Minutes Intravenous Every 8 hours 07/19/23 1143 07/22/23 1213   07/18/23 0600  meropenem  (MERREM ) 1 g in sodium chloride  0.9 % 100 mL IVPB  Status:  Discontinued        1 g 200 mL/hr over 30 Minutes Intravenous Every 24 hours 07/17/23 0916 07/19/23 1143   07/17/23 0700  meropenem  (MERREM ) 1 g in sodium chloride  0.9 % 100 mL IVPB  Status:  Discontinued        1 g 200 mL/hr over 30 Minutes Intravenous Every 12 hours 07/17/23 0601 07/17/23 0916   07/15/23 1300  vancomycin  (VANCOCIN ) IVPB 1000 mg/200 mL premix  Status:  Discontinued        1,000 mg 200 mL/hr over 60 Minutes Intravenous Every 24 hours 07/14/23 1129 07/22/23 1319   07/14/23 1215  vancomycin  (VANCOREADY) IVPB 1750 mg/350 mL        1,750 mg 175 mL/hr over 120 Minutes Intravenous  Once 07/14/23 1129 07/14/23 1449   07/09/23 1800  piperacillin -tazobactam (ZOSYN ) IVPB 3.375 g  Status:  Discontinued        3.375 g 12.5 mL/hr over 240 Minutes Intravenous Every 12 hours 07/09/23 0756 07/09/23 0844   07/09/23 1400  piperacillin -tazobactam (ZOSYN ) IVPB 3.375 g  Status:  Discontinued        3.375 g 12.5 mL/hr over 240 Minutes Intravenous Every 8 hours 07/09/23 0844 07/17/23 0557   07/08/23 1300  piperacillin -tazobactam (ZOSYN ) IVPB 3.375 g  Status:  Discontinued        3.375 g 12.5 mL/hr over 240 Minutes Intravenous Every 8 hours 07/08/23 1129 07/09/23 0756   07/05/23 2200  azithromycin  (ZITHROMAX ) 500 mg in sodium chloride  0.9 % 250 mL IVPB        500 mg 250 mL/hr over 60 Minutes Intravenous Daily at bedtime 07/05/23 2022 07/06/23 2302   07/05/23 2115  azithromycin  (ZITHROMAX ) 250 mg in dextrose  5 % 125 mL IVPB  Status:  Discontinued       Note to Pharmacy: Missed  dose this morning   250 mg 127.5 mL/hr over 60 Minutes Intravenous Every 24 hours 07/05/23 2016 07/05/23 2021   07/04/23 0900  fluconazole  (DIFLUCAN ) IVPB 200 mg  Status:  Discontinued        200 mg 100 mL/hr over 60 Minutes Intravenous Every 48 hours 07/04/23 0722 07/09/23 0809   07/03/23 0600  cefTRIAXone  (ROCEPHIN ) 1 g in sodium chloride  0.9 % 100 mL IVPB  Status:  Discontinued        1 g 200 mL/hr over 30 Minutes Intravenous Every 24 hours 07/03/23 0508 07/08/23 1104   07/03/23 0600  azithromycin  (ZITHROMAX ) tablet 250 mg  Status:  Discontinued        250 mg Oral Daily 07/03/23 0508 07/05/23 2016        I have personally reviewed the following labs and images: CBC: Recent Labs  Lab 08/14/23  1043 08/15/23 0809 08/16/23 0417 08/17/23 0359 08/18/23 0530  WBC 8.4 8.0 8.5 8.9 8.4  NEUTROABS 6.6 6.1 6.9 7.7 7.1  HGB 10.7* 10.3* 9.8* 10.4* 9.7*  HCT 33.8* 32.1* 30.5* 33.0* 30.8*  MCV 99.7 99.4 100.0 102.2* 101.7*  PLT 207 215 169 170 177   BMP &GFR Recent Labs  Lab 08/14/23 0436 08/15/23 0809 08/15/23 0811 08/16/23 0417 08/17/23 0351 08/18/23 0530 08/18/23 0531  NA 131*   < > 134* 131* 131* 132* 131*  K 5.5*   < > 5.1 4.6 4.8 5.2* 5.2*  CL 95*   < > 95* 91* 93* 93* 92*  CO2 21*   < > 21* 23 24 23 23   GLUCOSE 178*   < > 120* 163* 127* 121* 121*  BUN 118*   < > 166* 128* 95* 149* 150*  CREATININE 2.88*   < > 4.13* 3.23* 2.63* 3.59* 3.51*  CALCIUM  8.2*   < > 8.3* 8.1* 8.0* 8.2* 8.2*  PHOS RESULTS UNAVAILABLE DUE TO INTERFERING SUBSTANCE  --  7.5* 6.6* 5.8*  --  7.1*   < > = values in this interval not displayed.   Estimated Creatinine Clearance: 24.9 mL/min (A) (by C-G formula based on SCr of 3.51 mg/dL (H)). Liver & Pancreas: Recent Labs  Lab 08/12/23 0525 08/13/23 0531 08/15/23 0809 08/15/23 0811 08/16/23 0417 08/17/23 0351 08/18/23 0530 08/18/23 0531  AST 13*  --  16  --  13* 13* 13*  --   ALT 11  --  11  --  7 5 8   --   ALKPHOS 43  --  42  --  40 45 40   --   BILITOT 0.9  --  0.6  --  0.5 0.7 0.7  --   PROT 8.5*  --  8.0  --  7.9 8.1 7.5  --   ALBUMIN  2.4*   < > 2.4* 2.4* 2.5* 2.6* 2.4* 2.4*   < > = values in this interval not displayed.   No results for input(s): "LIPASE", "AMYLASE" in the last 168 hours. Recent Labs  Lab 08/12/23 2124  AMMONIA 14   Diabetic: No results for input(s): "HGBA1C" in the last 72 hours.  Recent Labs  Lab 08/17/23 1611 08/17/23 1936 08/17/23 2119 08/18/23 0620 08/18/23 1327  GLUCAP 143* 211* 177* 114* 159*   Cardiac Enzymes: No results for input(s): "CKTOTAL", "CKMB", "CKMBINDEX", "TROPONINI" in the last 168 hours. No results for input(s): "PROBNP" in the last 8760 hours. Coagulation Profile: Recent Labs  Lab 08/13/23 0908  INR 1.0   Thyroid Function Tests: No results for input(s): "TSH", "T4TOTAL", "FREET4", "T3FREE", "THYROIDAB" in the last 72 hours. Lipid Profile: No results for input(s): "CHOL", "HDL", "LDLCALC", "TRIG", "CHOLHDL", "LDLDIRECT" in the last 72 hours. Anemia Panel: No results for input(s): "VITAMINB12", "FOLATE", "FERRITIN", "TIBC", "IRON", "RETICCTPCT" in the last 72 hours. Urine analysis:    Component Value Date/Time   COLORURINE STRAW (A) 07/05/2023 1912   APPEARANCEUR CLEAR 07/05/2023 1912   LABSPEC 1.008 07/05/2023 1912   PHURINE 7.0 07/05/2023 1912   GLUCOSEU NEGATIVE 07/05/2023 1912   HGBUR MODERATE (A) 07/05/2023 1912   BILIRUBINUR NEGATIVE 07/05/2023 1912   KETONESUR NEGATIVE 07/05/2023 1912   PROTEINUR 30 (A) 07/05/2023 1912   NITRITE NEGATIVE 07/05/2023 1912   LEUKOCYTESUR NEGATIVE 07/05/2023 1912   Sepsis Labs: Invalid input(s): "PROCALCITONIN", "LACTICIDVEN"  Microbiology: No results found for this or any previous visit (from the past 240 hours).  Radiology Studies: No results found.  Rutilio Yellowhair T. Roan Miklos [facility]  If 7PM-7AM, please contact night-coverage [email or web address] 08/18/2023, 2:13 PM

## 2023-08-18 NOTE — Progress Notes (Signed)
   08/18/23 1245  Vitals  Temp 99.3 F (37.4 C)  Pulse Rate (!) 117  Resp (!) 26  BP (!) 132/96  SpO2 98 %  O2 Device Tracheostomy Collar  Weight 91.1 kg  Type of Weight Post-Dialysis  Oxygen Therapy  Patient Activity (if Appropriate) In bed  Pulse Oximetry Type Continuous  Oximetry Probe Site Changed No  Post Treatment  Dialyzer Clearance Lightly streaked  Hemodialysis Intake (mL) 0 mL  Liters Processed 96  Fluid Removed (mL) 2000 mL  Tolerated HD Treatment Yes   Informed consent signed and in chart.   TX duration: 4 hours  Patient tolerated well.  Transported back to the room  Alert, without acute distress.  Hand-off given to patient's nurse.   Access used: Right chest HD catheter Access issues: None

## 2023-08-18 NOTE — Progress Notes (Signed)
 [facility] Progress Note   Assessment/ Plan:   # Oliguric AKI on CKD 3a - b/l creat 1.1- 1.5 from 2024. Creat here 5.8 on presentation. AKI due to myeloma kidney +/- hyperCa +/- ACEi. CRRT from 4/23--- 5/10.  on iHD now.    - next HD Mon.  Hoping to stay on MWF schedule to optimize chemo schedule - increased time to 4hrs due to ongoing azotemia  # AMS/acute metabolic encephalopathy: MRI, LP completed and unrevealing.  Waxes and wanes    #Volume -   Gentle UF moving forward   # Multiple myeloma: started on Velcade  and Cytoxan ,  Per onc   #AHRF/ aspergillus PNA/ s/p trach 5/8: on voriconazole  IV, off all other IV antibiotics. Off the ventilator on trach collar, per CCM   # Atrial fib-  on amio   #Anemia - prn transfusions. On epogen  per onc.   #hyperkalemia: mild, K 5,2. HD today.      Subjective:   Remains anuric. Seen at HD - tol tx fine.    Objective:   BP 111/72 (BP Location: Right Arm)   Pulse 100   Temp 97.7 F (36.5 C) (Axillary)   Resp 20   Ht 6' 0.01" (1.829 m)   Wt 93.1 kg   SpO2 98%   BMI 27.83 kg/m   Intake/Output Summary (Last 24 hours) at 08/18/2023 0816 Last data filed at 08/18/2023 0500 Gross per 24 hour  Intake --  Output 100 ml  Net -100 ml   Weight change: -0.5 kg  Physical Exam: Gen: NAD CVS: RRR Resp: on TC  Abd: soft Ext: no LE edema ACCESS: TDC  Imaging: No results found.    Labs: BMET Recent Labs  Lab 08/11/23 1059 08/12/23 0525 08/13/23 0531 08/14/23 0436 08/15/23 0809 08/15/23 0811 08/16/23 0417 08/17/23 0351 08/18/23 0530 08/18/23 0531  NA 137  137   < > 131* 131* 132* 134* 131* 131* 132* 131*  K 4.8  4.9   < > 5.2* 5.5* 5.0 5.1 4.6 4.8 5.2* 5.2*  CL 99  99   < > 100 95* 94* 95* 91* 93* 93* 92*  CO2 21*  20*   < > 20* 21* 21* 21* 23 24 23 23   GLUCOSE 213*  218*   < > 169* 178* 122* 120* 163* 127* 121* 121*  BUN 166*  170*   < > 170* 118* 165* 166* 128* 95* 149* 150*  CREATININE 3.60*  3.61*    < > 3.50* 2.88* 4.18* 4.13* 3.23* 2.63* 3.59* 3.51*  CALCIUM  8.0*  8.2*   < > 7.9* 8.2* 8.2* 8.3* 8.1* 8.0* 8.2* 8.2*  PHOS 6.8*  --  RESULTS UNAVAILABLE DUE TO INTERFERING SUBSTANCE RESULTS UNAVAILABLE DUE TO INTERFERING SUBSTANCE  --  7.5* 6.6* 5.8*  --  7.1*   < > = values in this interval not displayed.   CBC Recent Labs  Lab 08/15/23 0809 08/16/23 0417 08/17/23 0359 08/18/23 0530  WBC 8.0 8.5 8.9 8.4  NEUTROABS 6.1 6.9 7.7 7.1  HGB 10.3* 9.8* 10.4* 9.7*  HCT 32.1* 30.5* 33.0* 30.8*  MCV 99.4 100.0 102.2* 101.7*  PLT 215 169 170 177    Medications:     acyclovir   200 mg Per Tube BID   arformoterol   15 mcg Nebulization BID   collagenase    Topical Daily   feeding supplement (OSMOLITE 1.5 CAL)  355 mL Per Tube TID PC & HS   feeding supplement (PROSource TF20)  60 mL Per  Tube BID   fiber  1 packet Per Tube BID   folic acid   1 mg Per Tube Daily   heparin  injection (subcutaneous)  5,000 Units Subcutaneous Q8H   insulin  aspart  0-15 Units Subcutaneous TID AC & HS   insulin  aspart  2 Units Subcutaneous TID AC & HS   insulin  glargine-yfgn  10 Units Subcutaneous BID   multivitamin  1 tablet Per Tube QHS   nicotine   7 mg Transdermal Daily   nutrition supplement (JUVEN)  1 packet Per Tube BID BM   mouth rinse  15 mL Mouth Rinse 4 times per day   PARoxetine   40 mg Per Tube Daily   pneumococcal 20-valent conjugate vaccine  0.5 mL Intramuscular Tomorrow-1000   revefenacin   175 mcg Nebulization Daily   sodium chloride  flush  10-40 mL Intracatheter Q12H   sodium chloride  flush  10-40 mL Intracatheter Q12H   sodium zirconium cyclosilicate   10 g Per Tube Once   thiamine   100 mg Per Tube Daily   voriconazole   350 mg Per Tube Q12H   Adrian Alba MD [facility] Pager [phone]

## 2023-08-18 NOTE — Progress Notes (Signed)
 Pharmacy Antibiotic Note  Frank [name]. is a 58 y.o. male admitted on 07/03/2023 with generalized weakness (multifactorial secondary to hypercalcemia, suspected malignancy, multiple myeloma).  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   A voriconazole  trough was drawn 5/23 and showed a subtherapeutic level of 0.4. Notably patient was previously therapeutic on current dosing scheme (level 1.9 on 5/16)  It was felt that the continuous tube feeds were decreasing absorption of the voriconazole . Since then - have transitioned to bolus feeds. A repeat level was drawn this AM and is in process, will take a few days.   Plan: - Continue voriconazole  350 mg BID per tube - Will continue to monitor voriconazole  - and follow-up on results of level drawn 6/3 AM  Height: 6' 0.01" (182.9 cm) Weight: 93.1 kg (205 lb 4 oz) IBW/kg (Calculated) : 77.62  Temp (24hrs), Avg:98.7 F (37.1 C), Min:97.7 F (36.5 C), Max:99.3 F (37.4 C)  Recent Labs  Lab 08/14/23 1043 08/15/23 0809 08/15/23 0811 08/16/23 0417 08/17/23 0351 08/17/23 0359 08/18/23 0530 08/18/23 0531  WBC 8.4 8.0  --  8.5  --  8.9 8.4  --   CREATININE  --  4.18* 4.13* 3.23* 2.63*  --  3.59* 3.51*    Estimated Creatinine Clearance: 24.9 mL/min (A) (by C-G formula based on SCr of 3.51 mg/dL (H)).    Allergies  Allergen Reactions   Morphine And Codeine Anaphylaxis   Shellfish Allergy Anaphylaxis   Betadine  [Povidone Iodine ] Itching   Bupropion Other (See Comments)    Shaking of the body, hallucinations    Chlorhexidine  Hives    Patient reports never had issues CHG with mouth rinse.   Influenza Vaccines Hives   Metoprolol  Rash     Thank you for allowing pharmacy to be a part of this patient's care.  Garland Junk, PharmD, BCPS, BCIDP Infectious Diseases Clinical Pharmacist 08/19/2023 3:44 PM   **Pharmacist phone directory can now be found on [email or web address]  ([email or web address]).  Listed under [facility].

## 2023-08-18 NOTE — Progress Notes (Signed)
 Overall, his status is about the same.  I think still gets hemodialysis.  He has a G-tube in for feedings.  I am not sure the really is any change in his overall cognitive state.  We will go ahead and treat this week.  I think we are going to have to change his protocol up because of the rising light chains.  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  The myeloma cells making the light chains are the ones that are somewhat resilient.  Because of his chromosomal abnormalities, we really need to get a monoclonal antibody into the picture.  I think that Tyler Gallant would be a very good idea for him along with Cytoxan  and Kyprolis.  Hopefully, with this combination, we can begin to eradicate the light chain clone of plasma cells.  There are no labs back yet today.  Yesterday, his white count is 8.9.  Hemoglobin 10.4.  Platelet count 170,000.  His BUN was 95 creatinine 2.63.  Calcium  8 with an albumin  of 2.6.  He still is having some temperature spikes although they may not be as prominent.  He has not had any cultures taken for a couple weeks.  Again, we are to have to switch up his protocol.  I think we need to get a bit more aggressive with our protocol.  Again, I think he has a clonal population of plasma cells that are somewhat resistant to what he had been treated with already.  I would like to think that we get treatment started on either Tuesday or Wednesday.   [name], MD  2 Thes 3:5

## 2023-08-19 DIAGNOSIS — J9601 Acute respiratory failure with hypoxia: Secondary | ICD-10-CM | POA: Diagnosis not present

## 2023-08-19 DIAGNOSIS — G9341 Metabolic encephalopathy: Secondary | ICD-10-CM | POA: Diagnosis not present

## 2023-08-19 DIAGNOSIS — N179 Acute kidney failure, unspecified: Secondary | ICD-10-CM | POA: Diagnosis not present

## 2023-08-19 DIAGNOSIS — R531 Weakness: Secondary | ICD-10-CM | POA: Diagnosis not present

## 2023-08-19 LAB — CBC WITH DIFFERENTIAL/PLATELET
Abs Immature Granulocytes: 0.06 10*3/uL (ref 0.00–0.07)
Basophils Absolute: 0 10*3/uL (ref 0.0–0.1)
Basophils Relative: 0 %
Eosinophils Absolute: 0 10*3/uL (ref 0.0–0.5)
Eosinophils Relative: 0 %
HCT: 31.3 % — ABNORMAL LOW (ref 39.0–52.0)
Hemoglobin: 9.9 g/dL — ABNORMAL LOW (ref 13.0–17.0)
Immature Granulocytes: 1 %
Lymphocytes Relative: 11 %
Lymphs Abs: 0.9 10*3/uL (ref 0.7–4.0)
MCH: 32.4 pg (ref 26.0–34.0)
MCHC: 31.6 g/dL (ref 30.0–36.0)
MCV: 102.3 fL — ABNORMAL HIGH (ref 80.0–100.0)
Monocytes Absolute: 0.6 10*3/uL (ref 0.1–1.0)
Monocytes Relative: 7 %
Neutro Abs: 7.1 10*3/uL (ref 1.7–7.7)
Neutrophils Relative %: 81 %
Platelets: 183 10*3/uL (ref 150–400)
RBC: 3.06 MIL/uL — ABNORMAL LOW (ref 4.22–5.81)
RDW: 21.7 % — ABNORMAL HIGH (ref 11.5–15.5)
Smear Review: ADEQUATE
WBC: 8.7 10*3/uL (ref 4.0–10.5)
nRBC: 0 % (ref 0.0–0.2)

## 2023-08-19 LAB — GLUCOSE, CAPILLARY
Glucose-Capillary: 130 mg/dL — ABNORMAL HIGH (ref 70–99)
Glucose-Capillary: 258 mg/dL — ABNORMAL HIGH (ref 70–99)
Glucose-Capillary: 119 mg/dL — ABNORMAL HIGH (ref 70–99)
Glucose-Capillary: 172 mg/dL — ABNORMAL HIGH (ref 70–99)

## 2023-08-19 LAB — RENAL FUNCTION PANEL
Albumin: 2.5 g/dL — ABNORMAL LOW (ref 3.5–5.0)
Anion gap: 12 (ref 5–15)
BUN: 95 mg/dL — ABNORMAL HIGH (ref 6–20)
CO2: 25 mmol/L (ref 22–32)
Calcium: 8.1 mg/dL — ABNORMAL LOW (ref 8.9–10.3)
Chloride: 94 mmol/L — ABNORMAL LOW (ref 98–111)
Creatinine, Ser: 2.6 mg/dL — ABNORMAL HIGH (ref 0.61–1.24)
GFR, Estimated: 28 mL/min — ABNORMAL LOW (ref >60)
Glucose, Bld: 117 mg/dL — ABNORMAL HIGH (ref 70–99)
Phosphorus: 6.1 mg/dL — ABNORMAL HIGH (ref 2.5–4.6)
Potassium: 4.6 mmol/L (ref 3.5–5.1)
Sodium: 131 mmol/L — ABNORMAL LOW (ref 135–145)

## 2023-08-19 MED ORDER — NEPRO/CARBSTEADY PO LIQD
355.0000 mL | Freq: Three times a day (TID) | ORAL | Status: DC
  Administered 2023-08-19 – 2023-09-11 (×76): 355 mL
  Filled 2023-08-19 (×10): qty 474

## 2023-08-19 MED ORDER — PROSOURCE TF20 ENFIT COMPATIBL EN LIQD
60.0000 mL | Freq: Every day | ENTERAL | Status: DC
  Administered 2023-08-20 – 2023-09-16 (×25): 60 mL
  Filled 2023-08-19 (×27): qty 60

## 2023-08-19 NOTE — Progress Notes (Signed)
 Physical Therapy Treatment Patient Details Name: Frank [name]. MRN: [id number] DOB: [birth year] Today's Date: 08/19/2023   History of Present Illness 58 y.o. male transferred from [facility] 07/03/23 to [facility] for management of newly diagnosed plasma cell myeloma with weakness and AMS. Pt also with AKI and metabolic encephalopathy. 5/10 transfer to [facility] for iHD. 4/22 Intubated and chemo initiated. 4/23-5/10 CRRT. 5/6 IVIG started. 5/8 trach. 5/12 iHD initiated. 5/18 inability to oxygenate through trach, intubated and trach revision. 5/29 PEG placed PMHx:Lt THA, carpal tunnel syndrome, Lt TKA, Lt humerus fx, PAF, HTN, HLD, bipolar disorder, obesity, T2DM    PT Comments  Session today focused on assisting nursing with transferring pt to new air mattress from current bed. Pt required +2 total A for all mobility. No change in DC/DME recs at this time. PT will continue to follow.     If plan is discharge home, recommend the following: Two people to help with walking and/or transfers;Assistance with cooking/housework;Assist for transportation   Can travel by private vehicle     No  Equipment Recommendations  Hospital bed;Hoyer lift;Wheelchair (measurements PT);Wheelchair cushion (measurements PT)    Recommendations for Other Services       Precautions / Restrictions Precautions Precautions: Fall Recall of Precautions/Restrictions: Impaired Precaution/Restrictions Comments: trach 28% 6L, cortrak (paused during session and restarted upon PT exit), flexiseal Restrictions Weight Bearing Restrictions Per Provider Order: Yes LUE Weight Bearing Per Provider Order: Non weight bearing Other Position/Activity Restrictions: Lt humerus non-union, splinted. assuming LUE NWB.     Mobility  Bed Mobility Overal bed mobility: Needs Assistance Bed Mobility: Rolling Rolling: Total assist         General bed mobility comments: Total A for rolling to adjust bed pads.    Transfers Overall transfer  level: Needs assistance Equipment used: None Transfers: Bed to chair/wheelchair/BSC            Lateral/Scoot Transfers: +2 physical assistance, Total assist General transfer comment: +2-3 Total A lateral transfer from bed to air matress.    Ambulation/Gait                   Stairs             Wheelchair Mobility     Tilt Bed    Modified Rankin (Stroke Patients Only)       Balance                                            Communication Communication Communication: Impaired Factors Affecting Communication: Trach/intubated  Cognition Arousal: Alert Behavior During Therapy: Flat affect   PT - Cognitive impairments: Difficult to assess Difficult to assess due to: Impaired communication, Tracheostomy, Level of arousal                     PT - Cognition Comments: pt visually tracking, moreso to L than R. following commands on all extremities except LUE this date. nods yes/no with increased time and uses LLE to indicate yes or no (lifts LLE for yes) Following commands: Impaired Following commands impaired: Follows one step commands inconsistently, Follows one step commands with increased time    Cueing Cueing Techniques: Tactile cues, Visual cues, Verbal cues  Exercises      General Comments General comments (skin integrity, edema, etc.): VSS      Pertinent Vitals/Pain Pain Assessment Pain Assessment: Faces  Faces Pain Scale: No hurt    Home Living                          Prior Function            PT Goals (current goals can now be found in the care plan section) Progress towards PT goals: Progressing toward goals    Frequency    Min 1X/week      PT Plan      Co-evaluation              AM-PAC PT "6 Clicks" Mobility   Outcome Measure  Help needed turning from your back to your side while in a flat bed without using bedrails?: Total Help needed moving from lying on your back to sitting  on the side of a flat bed without using bedrails?: Total Help needed moving to and from a bed to a chair (including a wheelchair)?: Total Help needed standing up from a chair using your arms (e.g., wheelchair or bedside chair)?: Total Help needed to walk in hospital room?: Total Help needed climbing 3-5 steps with a railing? : Total 6 Click Score: 6    End of Session Equipment Utilized During Treatment: Oxygen Activity Tolerance: Patient tolerated treatment well Patient left: in bed;with call bell/phone within reach;with bed alarm set Nurse Communication: Mobility status PT Visit Diagnosis: Other abnormalities of gait and mobility (R26.89);Muscle weakness (generalized) (M62.81);Difficulty in walking, not elsewhere classified (R26.2)     Time: 1610-9604 PT Time Calculation (min) (ACUTE ONLY): 19 min  Charges:    $Therapeutic Activity: 8-22 mins PT General Charges $$ ACUTE PT VISIT: 1 Visit                     Serenah Mill B, PT, DPT Acute Rehab Services [phone]    Frank [name] 08/19/2023, 3:20 PM

## 2023-08-19 NOTE — Progress Notes (Signed)
 Nutrition Follow-up  DOCUMENTATION CODES:  Severe malnutrition in context of acute illness/injury  INTERVENTION:  Continue bolus tube feeding via PEG: 6 cartons of Nepro 1.8 per day (1.5 cartons x 4 feeds per day) Will ensure there is a 2 hour break for voriconazole  administration BID (0900-1100, 2100-2300) Prosource TF20 60mL 1x/d Flush with 30mL before and after each blus feed (60mL 4x/d) This will provide 2600 kcal, 134g of protein, and of free water  ( TF+flush) Continue Juven 1 packet BID via tube, each packet provides 95 calories, 2.5 grams of protein (collagen) + micronutrients to support wound healing Continue Renal MVI daily via tube Nutrasource fiber BID  NUTRITION DIAGNOSIS:  Severe Malnutrition related to acute illness (prolonged illness, interuptions in feeding) as evidenced by moderate fat depletion, moderate muscle depletion, percent weight loss (9.6% x 1 month). - remains applicable  GOAL:  Patient will meet greater than or equal to 90% of their needs - met with TF  MONITOR:  TF tolerance, I & O's, Labs, Weight trends  REASON FOR ASSESSMENT:  Consult Assessment of nutrition requirement/status  ASSESSMENT:  58 y.o. male with PMH of obesity, asthma, OSA on CPAP, GERD, HTN, HLD who presented due to feeling weak with AMS for a few days. Admitted with concern for sepsis, community-acquired pneumonia, encephalopathy as well as concern for multiple myeloma.  4/17 Admit to [facility] 4/21 SLP eval - NPO; IR attempted Cortrak but unable to be placed; CCM MD successfully placed NGT 4/22 Concern for aspiration overnight; Intubated 4/23 - CRRT 5/8 - tracheostomy 5/10 - CRRT discontinued 5/11 - transferred to [facility] 5/12 - iHD initiated  5/29 - PEG placed  Pt being bathed with therapy team at the time of assessment. Pt tolerating goal rate bolus feeds. Potassium and phosphorus remains consistently elevated between HD sessions. Length of HD increased to  4 hours. Will adjust TF to Nepro as formula change did not help with loose stools.  Pt may benefit from a probiotic to aid in restoring gut flora but will need to discuss risks versus benefit with oncology team as he is immunocompromised during chemo   Admit weight: 110.6 kg (4/17) Current weight: 93.5 kg (6/3)  Intake/Output Summary (Last 24 hours) at 08/19/2023 0937 Last data filed at 08/19/2023 0540 Gross per 24 hour  Intake --  Output 2400 ml  Net -2400 ml  Net IO Since Admission: 289.16 mL [08/19/23 0937] UOP 400 mL x 24 hours FMS 1x recorded 24 hours  Drains/Lines: Tracheostomy  Tunneled HD catheter, right IJ, double lumen PEG 20 Fr. LUQ  Nutrition Related Medications:  Scheduled Meds:  OSMOLITE 1.5 CAL  355 mL Per Tube TID PC & HS   PROSource TF20  60 mL Per Tube BID   fiber  1 packet Per Tube BID   folic acid   1 mg Per Tube Daily   insulin  aspart  0-15 Units Subcutaneous TID AC & HS   insulin  aspart  2 Units Subcutaneous TID AC & HS   insulin  glargine-yfgn  10 Units Subcutaneous BID   multivitamin  1 tablet Per Tube QHS   JUVEN  1 packet Per Tube BID BM   thiamine   100 mg Per Tube Daily   voriconazole   350 mg Per Tube Q12H   PRN Meds: ondansetron   Labs Reviewed: Sodium 131, chloride 94 BUN 95, creatinine 2.6 Phosphorus 6.1 CBG ranges from 114-332 mg/dL over the last 24 hours HgbA1c 6.0%  Micronutrient Labs: CRP 1.5 (H) Vitamin A  42.5 (WNL)  Zinc  73 (WNL) Folate 17.8 (WNL)  Diet Order:   Diet Order             Diet NPO time specified  Diet effective now                  EDUCATION NEEDS:  Not appropriate for education at this time  Skin: Skin Integrity Issues Per WOC note 6/3 Stage 3: - sacrum/buttocks, (5 x 4.8 x 0.1 cm)   Last BM:  6/3 - type 7 FMS placed on 4/20  Height:  Ht Readings from Last 1 Encounters:  07/22/23 6' 0.01" (1.829 m)   Weight:  Wt Readings from Last 1 Encounters:  08/19/23 93.5 kg   Ideal Body Weight:  80.9  kg  BMI:  Body mass index is 27.95 kg/m.  Estimated Nutritional Needs:  Kcal:  2400-2700 kcal/d Protein:  130-150g/d Fluid:  1L+UOP   Frank [name], RD, LDN Registered Dietitian II Please reach out via secure chat

## 2023-08-19 NOTE — Plan of Care (Signed)
  Problem: Education: Goal: Knowledge of General Education information will improve Description: Including pain rating scale, medication(s)/side effects and non-pharmacologic comfort measures Outcome: Progressing   Problem: Health Behavior/Discharge Planning: Goal: Ability to manage health-related needs will improve Outcome: Progressing   Problem: Clinical Measurements: Goal: Ability to maintain clinical measurements within normal limits will improve Outcome: Progressing Goal: Will remain free from infection Outcome: Progressing Goal: Diagnostic test results will improve Outcome: Progressing Goal: Respiratory complications will improve Outcome: Progressing Goal: Cardiovascular complication will be avoided Outcome: Progressing   Problem: Activity: Goal: Risk for activity intolerance will decrease Outcome: Progressing   Problem: Nutrition: Goal: Adequate nutrition will be maintained Outcome: Progressing   Problem: Coping: Goal: Level of anxiety will decrease Outcome: Progressing   Problem: Elimination: Goal: Will not experience complications related to bowel motility Outcome: Progressing Goal: Will not experience complications related to urinary retention Outcome: Progressing   Problem: Pain Managment: Goal: General experience of comfort will improve and/or be controlled Outcome: Progressing   Problem: Safety: Goal: Ability to remain free from injury will improve Outcome: Progressing   Problem: Skin Integrity: Goal: Risk for impaired skin integrity will decrease Outcome: Progressing   Problem: Education: Goal: Ability to describe self-care measures that may prevent or decrease complications (Diabetes Survival Skills Education) will improve Outcome: Progressing Goal: Individualized Educational Video(s) Outcome: Progressing   Problem: Coping: Goal: Ability to adjust to condition or change in health will improve Outcome: Progressing   Problem: Fluid  Volume: Goal: Ability to maintain a balanced intake and output will improve Outcome: Progressing   Problem: Health Behavior/Discharge Planning: Goal: Ability to identify and utilize available resources and services will improve Outcome: Progressing Goal: Ability to manage health-related needs will improve Outcome: Progressing   Problem: Metabolic: Goal: Ability to maintain appropriate glucose levels will improve Outcome: Progressing   Problem: Nutritional: Goal: Maintenance of adequate nutrition will improve Outcome: Progressing Goal: Progress toward achieving an optimal weight will improve Outcome: Progressing   Problem: Skin Integrity: Goal: Risk for impaired skin integrity will decrease Outcome: Progressing   Problem: Tissue Perfusion: Goal: Adequacy of tissue perfusion will improve Outcome: Progressing   Problem: Education: Goal: Knowledge about tracheostomy care/management will improve Outcome: Progressing   Problem: Activity: Goal: Ability to tolerate increased activity will improve Outcome: Progressing   Problem: Health Behavior/Discharge Planning: Goal: Ability to manage tracheostomy will improve Outcome: Progressing   Problem: Respiratory: Goal: Patent airway maintenance will improve Outcome: Progressing   Problem: Role Relationship: Goal: Ability to communicate will improve Outcome: Progressing   Problem: Education: Goal: Knowledge of disease and its progression will improve Outcome: Progressing Goal: Individualized Educational Video(s) Outcome: Progressing   Problem: Fluid Volume: Goal: Compliance with measures to maintain balanced fluid volume will improve Outcome: Progressing   Problem: Health Behavior/Discharge Planning: Goal: Ability to manage health-related needs will improve Outcome: Progressing

## 2023-08-19 NOTE — Progress Notes (Signed)
 Speech Language Pathology Treatment: Frank [name] Speaking valve  Patient Details Name: Frank [name]. MRN: [id number] DOB: [birth year] Today's Date: 08/19/2023 Time: 2952-8413 SLP Time Calculation (min) (ACUTE ONLY): 18 min  Assessment / Plan / Recommendation Clinical Impression  Skilled therapy session focused on PMSV trials. Patient awake and alert upon SLP arrival with secretions around trach site. SLP suctioned with Yankauer and RN aided in repositioning. SLP ensured cuff was deflated and trialed finger occulusion. Patient tolerated well. SLP donned PMSV and patient able to achieve subglottic airflow and phonation. Patient verbalized "hey" "Frank [name]" and identified daughter in photo by his bed. No backflow present upon doffing PMSV. RN called patients wife per request. Patient able to verbalize single words with ~25% intelligibility to wife. Patient tolerated PMSV for approximately 10 minutes before requesting removal. Vitals stable throughout. Recommend continued PMSV trials with full supervision from therapy and nursing.     HPI HPI: 58 y.o. male transferred from [facility] 07/03/23 to [facility] for management of newly diagnosed plasma cell myeloma with weakness and AMS. Pt also with AKI and metabolic encephalopathy. 5/10 transfer to [facility] for iHD.Bedside swallow eval 4/18 wiht recs for NPO due to somnolence. 4/22 Intubated and chemo initiated. 4/23-5/10 CRRT. 5/6 IVIG started. 5/8 trach. 5/12 iHD initiated. 5/18 inability to oxygenate through trach, intubated and trach revision. PMHx:Lt THA, carpal tunnel syndrome, Lt TKA, Lt humerus fx, PAF, HTN, HLD, bipolar disorder, obesity, T2DM      SLP Plan  Continue with current plan of care   Recommendations for follow up therapy are one component of a multi-disciplinary discharge planning process, led by the attending physician.  Recommendations may be updated based on patient status, additional functional criteria and insurance authorization.     Recommendations        Patient may use Passy-Muir Speech Valve: During all therapies with supervision;Intermittently with supervision PMSV Supervision: Full MD: Please consider changing trach tube to : Cuffless           Oral care QID     [place] (R49.1)     Continue with current plan of care     Frank [name] M.A., CCC-SLP 08/19/2023, 2:40 PM

## 2023-08-19 NOTE — Progress Notes (Signed)
 So far, everything is relatively stable with Mr. Dinapoli.  I know that the daughter apparently is coming down from [place] .  She is with child.  We will hold on his chemotherapy for couple days.  I do not see a problem with this.  His mental status still is not that much different.  He does open his eyes.  His labs show sodium 131.  Potassium 4.6.  BUN 95 creatinine 2.6.  Calcium  is 8.1 with an albumin  of 2.5.  His white cell count is 8.7.  Hemoglobin 9.9.  Platelet count 183K.  Again, we are going changes protocol.  We really need to do this as he has a resilient population of myeloma cells evidenced by the increase in his kappa light chain.  Given the fact that he does have the chromosomal abnormalities, we really need to add the [place].  Again, I do not see a problem with holding his chemotherapy for couple of days.  Again I would like to for his daughter to be able to visit him.  I think his dialysis tomorrow.  I know he is getting great care for everybody on 3 W.  He is getting the tube feeds.  This is giving him good nutrition.   [name], MD   [name] 1:37

## 2023-08-19 NOTE — Progress Notes (Signed)
 PROGRESS NOTE  Frank [name]. GNF:[id number] DOB: 10-Aug-1965   PCP: Serita Danes, MD  Patient is from: Home  DOA: 07/03/2023 LOS: 47  Chief complaints No chief complaint on file.    Brief Narrative / Interim history: 58 year old M with PMH of OSA on CPAP, pathological fracture and multiple ORIF left humerus likely due to multiple myeloma, CKD-3A, A-fib not on AC, asthma, HTN, HLD and tobacco use disorder who had been feeling weak for about 1 week, admitted to [facility] with concern for sepsis, community-acquired pneumonia and acute encephalopathy.  Patient was found to have lytic lesions, he was transferred to [facility] for oncology treatment.  In the meantime, he developed encephalopathy, acute renal failure and remained very sick.  He remained in the ICU for 36 days.  Underwent tracheostomy. Deemed to be not stable for PEG tube placement.  He has been on tube feeds via cortrack.  Also started on dialysis after CRRT.   Significant events 4/17-admitted to medical floor with oncology consultation to treat multiple myeloma. 4/19-transferred to ICU intubated, encephalopathic. 4/20-A-fib with RVR.  Started heparin . Groin and biopsy site hematoma requiring multiple transfusions EEG 4/25 >> moderate to severe encephalopathy without any seizures or epileptiform discharges 4/29 Received Velcade  and Cytoxan  chemotherapy-intent palliative? 5/2 LP and ETT exchange  5/8 Trach placed. Some post op bleeding improved with thrombi-pad 5/10 voriconazole  added back based on CT Chest findings 5/13 more alert and able to follow some commands. 5/23, transferred to floor. 5/29-PEG placed 5/31 chemotherapy changed to Sarclisa/Kyprolis/Cytoxan .   Subjective: Seen and examined earlier this morning while on dialysis..  No major events overnight or this morning.  Awake.  Not able to vocalize due to trach.  Nods no to pain.   Objective: Vitals:   08/19/23 0402 08/19/23 0417 08/19/23  0425 08/19/23 0817  BP:    (!) 122/90  Pulse:  (!) 104    Resp:  17    Temp: 97.7 F (36.5 C)   98.7 F (37.1 C)  TempSrc: Axillary   Axillary  SpO2:  95%    Weight:   93.5 kg   Height:        Examination:  GENERAL: No apparent distress.  Nontoxic. HEENT: MMM.  Vision and hearing grossly intact.  Cortrack. NECK: Tracheostomy in place. RESP:  No IWOB.  Rhonchi bilaterally. CVS: HR 110s.  Heart sounds normal.  ABD/GI/GU: BS+. Abd soft, NTND.  G-tube and rectal tube in place. MSK/EXT: Moves right arm.  Left arm in brace.  Minimally moves BLE.  No edema.  NEURO: Awake.  Follows some commands.  Responds by nodding.  Not able to assess orientation due to speech difficulty in the setting of trach.  PSYCH: Calm.  No distress or agitation.  Consultants:  Neurology Nephrology Oncology Interventional radiology Infectious disease Palliative   Microbiology summarized: 4/28 BCx > no growth, final 4/30 respiratory culture-few PMN, rare budding yeast, abundant GPC> few aspergillus fumigatus 5/2-Quantiferon  neg 5/4-trach asp  rare GPC, rare mold  5/5-PCP smear trach asp neg  5/9-trach asp abundant staph hemolyticus, few budding yeast 5/11-blood cultures negative 5/15-trach aspirate negative   CSF studies 1 RBC, 1 WBC, glucose 106 CSF culture-2 organisms acute, rare WBC> NGTD Cryptococcal antigen negative Meningitis and encephalitis panel negative VDRL  non-reactive   Assessment and plan: Acute hypoxic respiratory failure and respiratory insufficiency status post tracheostomy:  -Pulmonology following intermittently I recommended keeping trach collar.  On #6 cuffed trach -Continue chest physiotherapy  Severe  acute metabolic encephalopathy with hypoactive delirium, seizure disorder: Normal MRI brain.  LP negative for meningitis or encephalitis.  EEG negative for seizure but encephalopathy.  Home Depakote  discontinued in ICU but resume later.  Markedly elevated BUN. -Reorientation  and delirium precaution -Discontinued Klonopin  on 5/30. -Depacon  resumed on 5/25 -Hemodialysis for uremia.   Multiple myeloma: Initially started on Velcade  and Cytoxan .  Chemotherapy changed to Sarclisa/Kyprolis/Cytoxan  on 5/31.   - Oncology following.  Recurrent fever/SIRS: Continues to spike fever with some tachycardia and tachypnea.  Normotensive.  Likely paraneoplastic.  He is also at risk for aspiration pneumonitis but respiratory status stable. -Symptomatic management   Aspergillosis with pneumonia:  -On voriconazole  per ID.  ID to decide duration.  -Also on acyclovir .  NIDDM-2 with hyperglycemia: A1c 6.0% (was 6.7% in 01/2023) Recent Labs  Lab 08/18/23 0620 08/18/23 1327 08/18/23 1540 08/18/23 2107 08/19/23 0616  GLUCAP 114* 159* 332* 173* 130*  -Continue Semglee  10 units twice daily on 5/28 -Continue SSI-moderate every 4 hours. -Continue NovoLog  2 units every 4 hours -Further adjustment as appropriate.  AKI on CKD-3A/uremia/hyponatremia/hyperkalemia: Due to MM, hypercalcemia and ACE inhibitors?  -S/p CRRT while in ICU.   -TDC on on 5/22.  Now on HD MWF -Lokelma  10 g twice daily x 1. -Nephrology managing.  Currently on HD.   Paroxysmal A-fib: Now in sinus rhythm.  Rate controlled without meds.   Unable to anticoagulate due to hematoma and intolerance.   Obstructive sleep apnea: Now with trach collar.  Anxiety/bipolar disorder - Continue home Paxil  - Continue Depakote    Goal of care: Poor long-term prognosis.  Palliative from prior providers had discussion with patient's wife who is an EMT.  Remains full code with full scope of care.   Severe malnutrition Body mass index is 27.95 kg/m. Nutrition Problem: Severe Malnutrition Etiology: acute illness (prolonged illness, interuptions in feeding) Signs/Symptoms: moderate fat depletion, moderate muscle depletion, percent weight loss (9.6% x 1 month) Percent weight loss: 9.6 % Interventions: Prostat, Tube feeding,  MVI  Pressure skin injury: Pressure Injury 07/05/23 Heel Left;Right Unstageable - Full thickness tissue loss in which the base of the injury is covered by slough (yellow, tan, gray, green or brown) and/or eschar (tan, brown or black) in the wound bed. (Active)  07/05/23 1108  Location: Heel  Location Orientation: Left;Right  Staging: Unstageable - Full thickness tissue loss in which the base of the injury is covered by slough (yellow, tan, gray, green or brown) and/or eschar (tan, brown or black) in the wound bed.  Wound Description (Comments):   Present on Admission: Yes  Dressing Type Foam - Lift dressing to assess site every shift 08/19/23 1000     Pressure Injury 07/11/23 Buttocks Mid Unstageable - Full thickness tissue loss in which the base of the injury is covered by slough (yellow, tan, gray, green or brown) and/or eschar (tan, brown or black) in the wound bed. 8 cm x 6 cmx 0.1 cm (Active)  07/11/23 1600  Location: Buttocks  Location Orientation: Mid  Staging: Unstageable - Full thickness tissue loss in which the base of the injury is covered by slough (yellow, tan, gray, green or brown) and/or eschar (tan, brown or black) in the wound bed.  Wound Description (Comments): 8 cm x 6 cmx 0.1 cm  Present on Admission: No  Dressing Type Foam - Lift dressing to assess site every shift 08/19/23 1000     Pressure Injury 07/27/23 Buttocks Right;Lower (Active)  07/27/23 1700  Location: Buttocks  Location Orientation:  Right;Lower  Staging:   Wound Description (Comments):   Present on Admission: Yes  Dressing Type Foam - Lift dressing to assess site every shift 08/19/23 1000   DVT prophylaxis:  heparin  injection 5,000 Units Start: 08/14/23 2200 Place and maintain sequential compression device Start: 07/14/23 1033  Code Status: Full code Family Communication: None at bedside. Level of care: Progressive Status is: Inpatient Remains inpatient appropriate because: Respiratory failure,  encephalopathy, renal failure   Final disposition: LTAC   35 minutes with more than 50% spent in reviewing records, counseling patient/family and coordinating care.   Sch Meds:  Scheduled Meds:  acyclovir   200 mg Per Tube BID   arformoterol   15 mcg Nebulization BID   feeding supplement (NEPRO CARB STEADY)  355 mL Per Tube TID PC & HS   [START ON 08/20/2023] feeding supplement (PROSource TF20)  60 mL Per Tube Daily   fiber  1 packet Per Tube BID   folic acid   1 mg Per Tube Daily   heparin  injection (subcutaneous)  5,000 Units Subcutaneous Q8H   insulin  aspart  0-15 Units Subcutaneous TID AC & HS   insulin  aspart  2 Units Subcutaneous TID AC & HS   insulin  glargine-yfgn  10 Units Subcutaneous BID   multivitamin  1 tablet Per Tube QHS   nicotine   7 mg Transdermal Daily   nutrition supplement (JUVEN)  1 packet Per Tube BID BM   mouth rinse  15 mL Mouth Rinse 4 times per day   PARoxetine   40 mg Per Tube Daily   pneumococcal 20-valent conjugate vaccine  0.5 mL Intramuscular Tomorrow-1000   revefenacin   175 mcg Nebulization Daily   sodium chloride  flush  10-40 mL Intracatheter Q12H   sodium chloride  flush  10-40 mL Intracatheter Q12H   thiamine   100 mg Per Tube Daily   voriconazole   350 mg Per Tube Q12H   Continuous Infusions:  valproate sodium  750 mg (08/18/23 2351)   PRN Meds:.acetaminophen  (TYLENOL ) oral liquid 160 mg/5 mL **OR** acetaminophen , hydrALAZINE , iohexol , ipratropium-albuterol , ondansetron  (ZOFRAN ) IV, mouth rinse, oxyCODONE , artificial tears, sodium chloride  flush, sodium chloride  flush  Antimicrobials: Anti-infectives (From admission, onward)    Start     Dose/Rate Route Frequency Ordered Stop   08/14/23 0600  ceFAZolin  (ANCEF ) IVPB 2g/100 mL premix        2 g 200 mL/hr over 30 Minutes Intravenous To Radiology 08/13/23 1055 08/14/23 0606   08/09/23 1000  acyclovir  (ZOVIRAX ) 200 MG capsule 200 mg       Note to Pharmacy: Give after dialysis the pm dose, on dialysis  days   200 mg Per Tube 2 times daily 08/08/23 2358     08/09/23 0100  acyclovir  (ZOVIRAX ) 200 MG capsule 200 mg       Note to Pharmacy: Give after dialysis the pm dose, on dialysis days   200 mg Per Tube  Once 08/09/23 0009 08/09/23 0013   08/08/23 2345  acyclovir  (ZOVIRAX ) 200 MG capsule 200 mg  Status:  Discontinued       Note to Pharmacy: Give after dialysis the pm dose, on dialysis days   200 mg Oral 2 times daily 08/08/23 2257 08/08/23 2358   08/07/23 1530  ceFAZolin  (ANCEF ) IVPB 2g/100 mL premix        over 30 Minutes Intravenous Continuous PRN 08/07/23 1555 08/07/23 1530   08/05/23 2200  voriconazole  (VFEND ) tablet 350 mg        350 mg Per Tube Every 12 hours 08/05/23 1511  08/01/23 1200  vancomycin  (VANCOCIN ) IVPB 1000 mg/200 mL premix        1,000 mg 200 mL/hr over 60 Minutes Intravenous Every M-W-F (Hemodialysis) 07/31/23 0649 08/01/23 1652   07/30/23 1200  vancomycin  (VANCOCIN ) IVPB 1000 mg/200 mL premix        1,000 mg 200 mL/hr over 60 Minutes Intravenous Every M-W-F (Hemodialysis) 07/30/23 0851 07/30/23 1241   07/29/23 0900  vancomycin  (VANCOCIN ) IVPB 1000 mg/200 mL premix        1,000 mg 200 mL/hr over 60 Minutes Intravenous  Once 07/29/23 0812 07/29/23 0943   07/28/23 1204  vancomycin  variable dose per unstable renal function (pharmacist dosing)  Status:  Discontinued         Does not apply See admin instructions 07/28/23 1204 07/31/23 0649   07/28/23 1000  voriconazole  (VFEND ) 350 mg in sodium chloride  0.9 % 100 mL IVPB  Status:  Discontinued       Placed in "Followed by" Linked Group   4 mg/kg  88.6 kg (Adjusted) 67.5 mL/hr over 120 Minutes Intravenous Every 12 hours 07/28/23 0748 08/05/23 1511   07/27/23 1000  voriconazole  (VFEND ) 400 mg in sodium chloride  0.9 % 100 mL IVPB  Status:  Discontinued       Placed in "Followed by" Linked Group   4 mg/kg  100.6 kg 70 mL/hr over 120 Minutes Intravenous Every 12 hours 07/26/23 0826 07/28/23 0748   07/27/23 0945   vancomycin  (VANCOREADY) IVPB 2000 mg/400 mL        2,000 mg 200 mL/hr over 120 Minutes Intravenous  Once 07/27/23 0849 07/27/23 1237   07/26/23 1515  Ampicillin -Sulbactam (UNASYN ) 3 g in sodium chloride  0.9 % 100 mL IVPB  Status:  Discontinued        3 g 200 mL/hr over 30 Minutes Intravenous Every 8 hours 07/26/23 1418 07/27/23 0830   07/26/23 0915  voriconazole  (VFEND ) 600 mg in sodium chloride  0.9 % 150 mL IVPB       Placed in "Followed by" Linked Group   6 mg/kg  100.6 kg 105 mL/hr over 120 Minutes Intravenous Every 12 hours 07/26/23 0826 07/27/23 0048   07/26/23 0900  Ampicillin -Sulbactam (UNASYN ) 3 g in sodium chloride  0.9 % 100 mL IVPB  Status:  Discontinued        3 g 200 mL/hr over 30 Minutes Intravenous Every 12 hours 07/26/23 0826 07/26/23 1418   07/23/23 1000  levofloxacin  (LEVAQUIN ) IVPB 500 mg  Status:  Discontinued       Placed in "Followed by" Linked Group   500 mg 100 mL/hr over 60 Minutes Intravenous Every 24 hours 07/22/23 1213 07/23/23 1001   07/23/23 1000  levofloxacin  (LEVAQUIN ) IVPB 500 mg  Status:  Discontinued       Placed in "Followed by" Linked Group   500 mg 100 mL/hr over 60 Minutes Intravenous Every 24 hours 07/23/23 1001 07/28/23 0804   07/22/23 1300  levofloxacin  (LEVAQUIN ) IVPB 750 mg       Placed in "Followed by" Linked Group   750 mg 100 mL/hr over 90 Minutes Intravenous  Once 07/22/23 1213 07/22/23 1542   07/21/23 1600  voriconazole  (VFEND ) 300 mg in sodium chloride  0.9 % 100 mL IVPB  Status:  Discontinued        300 mg 65 mL/hr over 120 Minutes Intravenous Every 12 hours 07/21/23 1430 07/22/23 1415   07/21/23 1000  voriconazole  (VFEND ) tablet 200 mg  Status:  Discontinued        200 mg  Oral Every 12 hours 07/20/23 0944 07/21/23 1015   07/20/23 1030  voriconazole  (VFEND ) 530 mg in sodium chloride  0.9 % 150 mL IVPB        6 mg/kg  88.6 kg (Adjusted) 101.5 mL/hr over 120 Minutes Intravenous Every 12 hours 07/20/23 0944 07/21/23 0030   07/19/23 1400   meropenem  (MERREM ) 1 g in sodium chloride  0.9 % 100 mL IVPB  Status:  Discontinued        1 g 200 mL/hr over 30 Minutes Intravenous Every 8 hours 07/19/23 1143 07/22/23 1213   07/18/23 0600  meropenem  (MERREM ) 1 g in sodium chloride  0.9 % 100 mL IVPB  Status:  Discontinued        1 g 200 mL/hr over 30 Minutes Intravenous Every 24 hours 07/17/23 0916 07/19/23 1143   07/17/23 0700  meropenem  (MERREM ) 1 g in sodium chloride  0.9 % 100 mL IVPB  Status:  Discontinued        1 g 200 mL/hr over 30 Minutes Intravenous Every 12 hours 07/17/23 0601 07/17/23 0916   07/15/23 1300  vancomycin  (VANCOCIN ) IVPB 1000 mg/200 mL premix  Status:  Discontinued        1,000 mg 200 mL/hr over 60 Minutes Intravenous Every 24 hours 07/14/23 1129 07/22/23 1319   07/14/23 1215  vancomycin  (VANCOREADY) IVPB 1750 mg/350 mL        1,750 mg 175 mL/hr over 120 Minutes Intravenous  Once 07/14/23 1129 07/14/23 1449   07/09/23 1800  piperacillin -tazobactam (ZOSYN ) IVPB 3.375 g  Status:  Discontinued        3.375 g 12.5 mL/hr over 240 Minutes Intravenous Every 12 hours 07/09/23 0756 07/09/23 0844   07/09/23 1400  piperacillin -tazobactam (ZOSYN ) IVPB 3.375 g  Status:  Discontinued        3.375 g 12.5 mL/hr over 240 Minutes Intravenous Every 8 hours 07/09/23 0844 07/17/23 0557   07/08/23 1300  piperacillin -tazobactam (ZOSYN ) IVPB 3.375 g  Status:  Discontinued        3.375 g 12.5 mL/hr over 240 Minutes Intravenous Every 8 hours 07/08/23 1129 07/09/23 0756   07/05/23 2200  azithromycin  (ZITHROMAX ) 500 mg in sodium chloride  0.9 % 250 mL IVPB        500 mg 250 mL/hr over 60 Minutes Intravenous Daily at bedtime 07/05/23 2022 07/06/23 2302   07/05/23 2115  azithromycin  (ZITHROMAX ) 250 mg in dextrose  5 % 125 mL IVPB  Status:  Discontinued       Note to Pharmacy: Missed dose this morning   250 mg 127.5 mL/hr over 60 Minutes Intravenous Every 24 hours 07/05/23 2016 07/05/23 2021   07/04/23 0900  fluconazole  (DIFLUCAN ) IVPB 200 mg   Status:  Discontinued        200 mg 100 mL/hr over 60 Minutes Intravenous Every 48 hours 07/04/23 0722 07/09/23 0809   07/03/23 0600  cefTRIAXone  (ROCEPHIN ) 1 g in sodium chloride  0.9 % 100 mL IVPB  Status:  Discontinued        1 g 200 mL/hr over 30 Minutes Intravenous Every 24 hours 07/03/23 0508 07/08/23 1104   07/03/23 0600  azithromycin  (ZITHROMAX ) tablet 250 mg  Status:  Discontinued        250 mg Oral Daily 07/03/23 0508 07/05/23 2016        I have personally reviewed the following labs and images: CBC: Recent Labs  Lab 08/15/23 0809 08/16/23 0417 08/17/23 0359 08/18/23 0530 08/19/23 0413  WBC 8.0 8.5 8.9 8.4 8.7  NEUTROABS 6.1 6.9 7.7  7.1 7.1  HGB 10.3* 9.8* 10.4* 9.7* 9.9*  HCT 32.1* 30.5* 33.0* 30.8* 31.3*  MCV 99.4 100.0 102.2* 101.7* 102.3*  PLT 215 169 170 177 183   BMP &GFR Recent Labs  Lab 08/15/23 0811 08/16/23 0417 08/17/23 0351 08/18/23 0530 08/18/23 0531 08/19/23 0413  NA 134* 131* 131* 132* 131* 131*  K 5.1 4.6 4.8 5.2* 5.2* 4.6  CL 95* 91* 93* 93* 92* 94*  CO2 21* 23 24 23 23 25   GLUCOSE 120* 163* 127* 121* 121* 117*  BUN 166* 128* 95* 149* 150* 95*  CREATININE 4.13* 3.23* 2.63* 3.59* 3.51* 2.60*  CALCIUM  8.3* 8.1* 8.0* 8.2* 8.2* 8.1*  PHOS 7.5* 6.6* 5.8*  --  7.1* 6.1*   Estimated Creatinine Clearance: 36.3 mL/min (A) (by C-G formula based on SCr of 2.6 mg/dL (H)). Liver & Pancreas: Recent Labs  Lab 08/15/23 0809 08/15/23 0811 08/16/23 0417 08/17/23 0351 08/18/23 0530 08/18/23 0531 08/19/23 0413  AST 16  --  13* 13* 13*  --   --   ALT 11  --  7 5 8   --   --   ALKPHOS 42  --  40 45 40  --   --   BILITOT 0.6  --  0.5 0.7 0.7  --   --   PROT 8.0  --  7.9 8.1 7.5  --   --   ALBUMIN  2.4*   < > 2.5* 2.6* 2.4* 2.4* 2.5*   < > = values in this interval not displayed.   No results for input(s): "LIPASE", "AMYLASE" in the last 168 hours. Recent Labs  Lab 08/12/23 2124  AMMONIA 14   Diabetic: No results for input(s): "HGBA1C" in the  last 72 hours.  Recent Labs  Lab 08/18/23 0620 08/18/23 1327 08/18/23 1540 08/18/23 2107 08/19/23 0616  GLUCAP 114* 159* 332* 173* 130*   Cardiac Enzymes: No results for input(s): "CKTOTAL", "CKMB", "CKMBINDEX", "TROPONINI" in the last 168 hours. No results for input(s): "PROBNP" in the last 8760 hours. Coagulation Profile: Recent Labs  Lab 08/13/23 0908  INR 1.0   Thyroid Function Tests: No results for input(s): "TSH", "T4TOTAL", "FREET4", "T3FREE", "THYROIDAB" in the last 72 hours. Lipid Profile: No results for input(s): "CHOL", "HDL", "LDLCALC", "TRIG", "CHOLHDL", "LDLDIRECT" in the last 72 hours. Anemia Panel: No results for input(s): "VITAMINB12", "FOLATE", "FERRITIN", "TIBC", "IRON", "RETICCTPCT" in the last 72 hours. Urine analysis:    Component Value Date/Time   COLORURINE STRAW (A) 07/05/2023 1912   APPEARANCEUR CLEAR 07/05/2023 1912   LABSPEC 1.008 07/05/2023 1912   PHURINE 7.0 07/05/2023 1912   GLUCOSEU NEGATIVE 07/05/2023 1912   HGBUR MODERATE (A) 07/05/2023 1912   BILIRUBINUR NEGATIVE 07/05/2023 1912   KETONESUR NEGATIVE 07/05/2023 1912   PROTEINUR 30 (A) 07/05/2023 1912   NITRITE NEGATIVE 07/05/2023 1912   LEUKOCYTESUR NEGATIVE 07/05/2023 1912   Sepsis Labs: Invalid input(s): "PROCALCITONIN", "LACTICIDVEN"  Microbiology: No results found for this or any previous visit (from the past 240 hours).  Radiology Studies: No results found.     Frank [name] [facility]  If 7PM-7AM, please contact night-coverage [email or web address] 08/19/2023, 10:55 AM

## 2023-08-19 NOTE — Progress Notes (Signed)
 [facility] night cross cover note:   I was notified by the patient's RN that the patient exhibited a 5 beat run of SVT on the monitor, at which time the patient was asymptomatic, with ensuing conversion back to sinus tachycardia with heart rates in the low 100s.  The vital signs appear stable, including afebrile, with most recent blood pressure 100/70, and no change in supplemental oxygen requirements.  Will continue to monitor.      Camelia Cavalier, DO Hospitalist

## 2023-08-19 NOTE — Consult Note (Signed)
 WOC Nurse wound follow up Wound type: Pressure injuries stage 3 on Sacrum/buttock. Left posterior thigh and right hip healed. Measurement - 5 cm x 4.8 cm x 0.1 cm Wound bed: Sacrum - 80% yellow, 20% red.  Drainage (amount, consistency, odor) Moderate amount, serous, no odor. Periwound: intact. Dressing procedure/placement/frequency: Apply Santyl  to the wound bed, change daily. Cover with foam dressing, change PRN or every 3 days.   WOC team will follow weekly.   Please reconsult if further assistance is needed. Thank-you,  Rachel Budds BSN, RN, [employer], WOC  (Pager: [phone])

## 2023-08-19 NOTE — Progress Notes (Signed)
 [name] Progress Note   Assessment/ Plan:   # Oliguric AKI on CKD 3a - b/l creat 1.1- 1.5 from 2024. Creat here 5.8 on presentation. AKI due to myeloma kidney +/- hyperCa +/- ACEi. CRRT from 4/23--- 5/10.  on iHD now.    - next HD Wed.  Hoping to stay on MWF schedule to optimize chemo schedule - increased time to 4hrs due to ongoing azotemia  # AMS/acute metabolic encephalopathy: MRI, LP completed and unrevealing.  Waxes and wanes    #Volume -   looking fairly euvolemic now   # Multiple myeloma: started on Velcade  and Cytoxan ,  Per onc   #AHRF/ aspergillus PNA/ s/p trach 5/8: on voriconazole  IV, off all other IV antibiotics. Off the ventilator on trach collar, per CCM   # Atrial fib-  on amio   #Anemia - prn transfusions. On epogen  per onc.     Subjective:   Remains anuric. Seen in room, d/w RN - no new issues.    Objective:   BP 106/73 (BP Location: Right Arm)   Pulse (!) 104   Temp 98.7 F (37.1 C) (Oral)   Resp 18   Ht 6' 0.01" (1.829 m)   Wt 93.5 kg   SpO2 95%   BMI 27.95 kg/m   Intake/Output Summary (Last 24 hours) at 08/19/2023 1159 Last data filed at 08/19/2023 0540 Gross per 24 hour  Intake --  Output 2400 ml  Net -2400 ml   Weight change: 0 kg  Physical Exam: Gen: sleeping CVS: RRR Resp: on TC  Abd: soft Ext: no LE edema ACCESS: TDC  Imaging: No results found.    Labs: BMET Recent Labs  Lab 08/13/23 0531 08/14/23 0436 08/15/23 0809 08/15/23 0811 08/16/23 0417 08/17/23 0351 08/18/23 0530 08/18/23 0531 08/19/23 0413  NA 131* 131* 132* 134* 131* 131* 132* 131* 131*  K 5.2* 5.5* 5.0 5.1 4.6 4.8 5.2* 5.2* 4.6  CL 100 95* 94* 95* 91* 93* 93* 92* 94*  CO2 20* 21* 21* 21* 23 24 23 23 25   GLUCOSE 169* 178* 122* 120* 163* 127* 121* 121* 117*  BUN 170* 118* 165* 166* 128* 95* 149* 150* 95*  CREATININE 3.50* 2.88* 4.18* 4.13* 3.23* 2.63* 3.59* 3.51* 2.60*  CALCIUM  7.9* 8.2* 8.2* 8.3* 8.1* 8.0* 8.2* 8.2* 8.1*  PHOS RESULTS  UNAVAILABLE DUE TO INTERFERING SUBSTANCE RESULTS UNAVAILABLE DUE TO INTERFERING SUBSTANCE  --  7.5* 6.6* 5.8*  --  7.1* 6.1*   CBC Recent Labs  Lab 08/16/23 0417 08/17/23 0359 08/18/23 0530 08/19/23 0413  WBC 8.5 8.9 8.4 8.7  NEUTROABS 6.9 7.7 7.1 7.1  HGB 9.8* 10.4* 9.7* 9.9*  HCT 30.5* 33.0* 30.8* 31.3*  MCV 100.0 102.2* 101.7* 102.3*  PLT 169 170 177 183    Medications:     acyclovir   200 mg Per Tube BID   arformoterol   15 mcg Nebulization BID   feeding supplement (NEPRO CARB STEADY)  355 mL Per Tube TID PC & HS   [START ON 08/20/2023] feeding supplement (PROSource TF20)  60 mL Per Tube Daily   fiber  1 packet Per Tube BID   folic acid   1 mg Per Tube Daily   heparin  injection (subcutaneous)  5,000 Units Subcutaneous Q8H   insulin  aspart  0-15 Units Subcutaneous TID AC & HS   insulin  aspart  2 Units Subcutaneous TID AC & HS   insulin  glargine-yfgn  10 Units Subcutaneous BID   multivitamin  1 tablet Per Tube QHS  nicotine   7 mg Transdermal Daily   nutrition supplement (JUVEN)  1 packet Per Tube BID BM   mouth rinse  15 mL Mouth Rinse 4 times per day   PARoxetine   40 mg Per Tube Daily   pneumococcal 20-valent conjugate vaccine  0.5 mL Intramuscular Tomorrow-1000   revefenacin   175 mcg Nebulization Daily   sodium chloride  flush  10-40 mL Intracatheter Q12H   sodium chloride  flush  10-40 mL Intracatheter Q12H   thiamine   100 mg Per Tube Daily   voriconazole   350 mg Per Tube Q12H   Adrian Alba MD [facility] Pager [phone]

## 2023-08-20 DIAGNOSIS — S7011XA Contusion of right thigh, initial encounter: Secondary | ICD-10-CM

## 2023-08-20 DIAGNOSIS — R531 Weakness: Secondary | ICD-10-CM | POA: Diagnosis not present

## 2023-08-20 DIAGNOSIS — N179 Acute kidney failure, unspecified: Secondary | ICD-10-CM | POA: Diagnosis not present

## 2023-08-20 DIAGNOSIS — E119 Type 2 diabetes mellitus without complications: Secondary | ICD-10-CM

## 2023-08-20 DIAGNOSIS — Z7189 Other specified counseling: Secondary | ICD-10-CM | POA: Insufficient documentation

## 2023-08-20 DIAGNOSIS — G9341 Metabolic encephalopathy: Secondary | ICD-10-CM | POA: Diagnosis not present

## 2023-08-20 DIAGNOSIS — J9601 Acute respiratory failure with hypoxia: Secondary | ICD-10-CM | POA: Diagnosis not present

## 2023-08-20 DIAGNOSIS — G40909 Epilepsy, unspecified, not intractable, without status epilepticus: Secondary | ICD-10-CM

## 2023-08-20 LAB — CBC WITH DIFFERENTIAL/PLATELET
Abs Immature Granulocytes: 0.05 10*3/uL (ref 0.00–0.07)
Basophils Absolute: 0 10*3/uL (ref 0.0–0.1)
Basophils Relative: 0 %
Eosinophils Absolute: 0 10*3/uL (ref 0.0–0.5)
Eosinophils Relative: 0 %
HCT: 28.6 % — ABNORMAL LOW (ref 39.0–52.0)
Hemoglobin: 8.9 g/dL — ABNORMAL LOW (ref 13.0–17.0)
Immature Granulocytes: 1 %
Lymphocytes Relative: 16 %
Lymphs Abs: 0.9 10*3/uL (ref 0.7–4.0)
MCH: 32 pg (ref 26.0–34.0)
MCHC: 31.1 g/dL (ref 30.0–36.0)
MCV: 102.9 fL — ABNORMAL HIGH (ref 80.0–100.0)
Monocytes Absolute: 0.5 10*3/uL (ref 0.1–1.0)
Monocytes Relative: 8 %
Neutro Abs: 4.2 10*3/uL (ref 1.7–7.7)
Neutrophils Relative %: 75 %
Platelets: 194 10*3/uL (ref 150–400)
RBC: 2.78 MIL/uL — ABNORMAL LOW (ref 4.22–5.81)
RDW: 22 % — ABNORMAL HIGH (ref 11.5–15.5)
Smear Review: NORMAL
WBC: 5.6 10*3/uL (ref 4.0–10.5)
nRBC: 0.5 % — ABNORMAL HIGH (ref 0.0–0.2)

## 2023-08-20 LAB — GLUCOSE, CAPILLARY
Glucose-Capillary: 110 mg/dL — ABNORMAL HIGH (ref 70–99)
Glucose-Capillary: 159 mg/dL — ABNORMAL HIGH (ref 70–99)
Glucose-Capillary: 223 mg/dL — ABNORMAL HIGH (ref 70–99)
Glucose-Capillary: 141 mg/dL — ABNORMAL HIGH (ref 70–99)

## 2023-08-20 LAB — COMPREHENSIVE METABOLIC PANEL WITH GFR
ALT: 18 U/L (ref 0–44)
AST: 20 U/L (ref 15–41)
Albumin: 2.3 g/dL — ABNORMAL LOW (ref 3.5–5.0)
Alkaline Phosphatase: 41 U/L (ref 38–126)
Anion gap: 14 (ref 5–15)
BUN: 155 mg/dL — ABNORMAL HIGH (ref 6–20)
CO2: 25 mmol/L (ref 22–32)
Calcium: 8.5 mg/dL — ABNORMAL LOW (ref 8.9–10.3)
Chloride: 93 mmol/L — ABNORMAL LOW (ref 98–111)
Creatinine, Ser: 3.53 mg/dL — ABNORMAL HIGH (ref 0.61–1.24)
GFR, Estimated: 19 mL/min — ABNORMAL LOW (ref >60)
Glucose, Bld: 115 mg/dL — ABNORMAL HIGH (ref 70–99)
Potassium: 4.6 mmol/L (ref 3.5–5.1)
Sodium: 132 mmol/L — ABNORMAL LOW (ref 135–145)
Total Bilirubin: 0.6 mg/dL (ref 0.0–1.2)
Total Protein: 7.1 g/dL (ref 6.5–8.1)

## 2023-08-20 LAB — LACTATE DEHYDROGENASE: LDH: 226 U/L — ABNORMAL HIGH (ref 98–192)

## 2023-08-20 MED ORDER — COLLAGENASE 250 UNIT/GM EX OINT
TOPICAL_OINTMENT | Freq: Every day | CUTANEOUS | Status: DC
  Filled 2023-08-20 (×5): qty 30

## 2023-08-20 MED ORDER — MONTELUKAST SODIUM 5 MG PO CHEW
5.0000 mg | CHEWABLE_TABLET | Freq: Every day | ORAL | Status: DC
  Administered 2023-08-20 – 2023-09-10 (×20): 5 mg
  Filled 2023-08-20 (×23): qty 1

## 2023-08-20 MED ORDER — HEPARIN SODIUM (PORCINE) 1000 UNIT/ML IJ SOLN
INTRAMUSCULAR | Status: AC
  Filled 2023-08-20: qty 5

## 2023-08-20 NOTE — Progress Notes (Signed)
 [facility] Progress Note   Assessment/ Plan:   # Oliguric AKI on CKD 3a - b/l creat 1.1- 1.5 from 2024. Creat here 5.8 on presentation. AKI due to myeloma kidney +/- hyperCa +/- ACEi. CRRT from 4/23--- 5/10.  on iHD now.    - next HD today.  Hoping to stay on MWF schedule to optimize chemo schedule - increased time to 4hrs due to ongoing azotemia  # AMS/acute metabolic encephalopathy: MRI, LP completed and unrevealing.  Waxes and wanes    #Volume -   looking fairly euvolemic now   # Multiple myeloma: started on Velcade  and Cytoxan ,  Per on   #AHRF/ aspergillus PNA/ s/p trach 5/8: on voriconazole  IV, off all other IV antibiotics. Off the ventilator on trach collar, per CCM   # Atrial fib-  on amio   #Anemia - prn transfusions. On epogen  per onc.     Subjective:   Remains anuric. Seen in dialysis- no reported issues.    Objective:   BP 93/74   Pulse 99   Temp 98.6 F (37 C)   Resp 18   Ht 6' 0.01" (1.829 m)   Wt 119 kg   SpO2 98%   BMI 35.57 kg/m   Intake/Output Summary (Last 24 hours) at 08/20/2023 0919 Last data filed at 08/20/2023 0700 Gross per 24 hour  Intake 1107.63 ml  Output 700 ml  Net 407.63 ml   Weight change: 25.9 kg  Physical Exam: Gen: sleeping CVS: RRR Resp: on TC  Abd: soft Ext: no LE edema ACCESS: TDC  Imaging: No results found.    Labs: BMET Recent Labs  Lab 08/14/23 0436 08/15/23 0809 08/15/23 1308 08/16/23 0417 08/17/23 0351 08/18/23 0530 08/18/23 0531 08/19/23 0413 08/20/23 0527  NA 131*   < > 134* 131* 131* 132* 131* 131* 132*  K 5.5*   < > 5.1 4.6 4.8 5.2* 5.2* 4.6 4.6  CL 95*   < > 95* 91* 93* 93* 92* 94* 93*  CO2 21*   < > 21* 23 24 23 23 25 25   GLUCOSE 178*   < > 120* 163* 127* 121* 121* 117* 115*  BUN 118*   < > 166* 128* 95* 149* 150* 95* 155*  CREATININE 2.88*   < > 4.13* 3.23* 2.63* 3.59* 3.51* 2.60* 3.53*  CALCIUM  8.2*   < > 8.3* 8.1* 8.0* 8.2* 8.2* 8.1* 8.5*  PHOS RESULTS UNAVAILABLE DUE TO  INTERFERING SUBSTANCE  --  7.5* 6.6* 5.8*  --  7.1* 6.1*  --    < > = values in this interval not displayed.   CBC Recent Labs  Lab 08/17/23 0359 08/18/23 0530 08/19/23 0413 08/20/23 0527  WBC 8.9 8.4 8.7 5.6  NEUTROABS 7.7 7.1 7.1 4.2  HGB 10.4* 9.7* 9.9* 8.9*  HCT 33.0* 30.8* 31.3* 28.6*  MCV 102.2* 101.7* 102.3* 102.9*  PLT 170 177 183 194    Medications:     acyclovir   200 mg Per Tube BID   arformoterol   15 mcg Nebulization BID   feeding supplement (NEPRO CARB STEADY)  355 mL Per Tube TID PC & HS   feeding supplement (PROSource TF20)  60 mL Per Tube Daily   fiber  1 packet Per Tube BID   folic acid   1 mg Per Tube Daily   heparin  injection (subcutaneous)  5,000 Units Subcutaneous Q8H   insulin  aspart  0-15 Units Subcutaneous TID AC & HS   insulin  aspart  2 Units Subcutaneous TID AC &  HS   insulin  glargine-yfgn  10 Units Subcutaneous BID   montelukast   5 mg Per Tube QHS   multivitamin  1 tablet Per Tube QHS   nicotine   7 mg Transdermal Daily   nutrition supplement (JUVEN)  1 packet Per Tube BID BM   mouth rinse  15 mL Mouth Rinse 4 times per day   PARoxetine   40 mg Per Tube Daily   pneumococcal 20-valent conjugate vaccine  0.5 mL Intramuscular Tomorrow-1000   revefenacin   175 mcg Nebulization Daily   sodium chloride  flush  10-40 mL Intracatheter Q12H   thiamine   100 mg Per Tube Daily   voriconazole   350 mg Per Tube Q12H   Adrian Alba MD [facility] Pager [phone]

## 2023-08-20 NOTE — Progress Notes (Addendum)
 Received patient in bed to unit. 2W02 Alert and oriented. Alert , trach , unable to assess Informed consent signed and in chart.  Yes TX duration: 4 hours  Patient tolerated well. Yes Transported back to the room via bed Alert, without acute distress.  Hand-off given to patient's nurse. Glenora Laos RN  Access used:Right chest CVC Access issues: None  Total UF removed: 2500 Medication(s) given:  Heparin  Post HD weight:  Post HD VS: 120/93, 97.6 ax, 21 Resp116  Pulse   Joletta Manner S Yardley Beltran RN, DNP [facility]

## 2023-08-20 NOTE — Progress Notes (Signed)
 Progress Note    Frank [name].   ZOX:[id number]  DOB: 25-Feb-1966  DOA: 07/03/2023     47 PCP: Serita Danes, MD  Initial CC: Weakness  Hospital Course: [name] is a 58 year old male with PMH of OSA on CPAP, pathological fracture and multiple ORIF left humerus likely due to multiple myeloma, CKD-3A, A-fib not on AC, asthma, HTN, HLD and tobacco use disorder who had been feeling weak for about 1 week, admitted to [facility] with concern for sepsis, community-acquired pneumonia and acute encephalopathy.  Patient was found to have lytic lesions, he was transferred to [facility] for oncology treatment.  In the meantime, he developed encephalopathy, acute renal failure. He remained in the ICU for 36 days.  Underwent tracheostomy. Deemed to be not stable for PEG tube placement.  He has been on tube feeds via cortrack.  Also started on dialysis after CRRT.   Significant events 4/17-admitted to medical floor with oncology consultation to treat multiple myeloma. 4/19-transferred to ICU intubated, encephalopathic. 4/20-A-fib with RVR.  Started heparin . Groin and biopsy site hematoma requiring multiple transfusions EEG 4/25 >> moderate to severe encephalopathy without any seizures or epileptiform discharges 4/29 Received Velcade  and Cytoxan  chemotherapy-intent palliative? 5/2 LP and ETT exchange  5/8 Trach placed. Some post op bleeding improved with thrombi-pad 5/10 voriconazole  added back based on CT Chest findings 5/13 more alert and able to follow some commands. 5/23, transferred to floor. 5/29-PEG placed 5/31 chemotherapy changed to Sarclisa/Kyprolis/Cytoxan .  Assessment and plan:  Multiple myeloma: Initially started on Velcade  and Cytoxan .  Chemotherapy changed to Sarclisa/Kyprolis/Cytoxan  on 5/31 - Oncology following  Goal of care - Poor long-term prognosis.   - Palliative from prior providers had discussion with patient's wife who is an EMT - Remains full  code with full scope of care - need to revisit goals when he has further deterioration   Acute hypoxic respiratory failure -Pulmonology following intermittently; recommended keeping trach collar.  On #6 cuffed trach -Continue chest physiotherapy  Right thigh hematoma - per CT on 08/01/23: "12.1 x 30.2 x 7.6 cm subcutaneous hematoma within the anterior right thigh. 8.4 x 10.4 x 22.2 cm subcutaneous hematoma within the right gluteal region - continue supportive care - trend CBC   Severe acute metabolic encephalopathy with hypoactive delirium Seizure disorder - Normal MRI brain.  LP negative for meningitis or encephalitis.  EEG negative for seizure but encephalopathy.   - Home Depakote  discontinued in ICU but resumed later.  Markedly elevated BUN -Reorientation and delirium precaution -Discontinued Klonopin  on 5/30 -Depacon  resumed on 5/25 -Hemodialysis for uremia   Recurrent fever/SIRS - Continues to spike fever with some tachycardia and tachypnea.  Normotensive - Likely paraneoplastic.  He is also at risk for aspiration pneumonitis but respiratory status stable -Symptomatic management   Aspergillosis with pneumonia -On voriconazole  per ID.  ID to decide duration -Also on acyclovir    NIDDM-2 with hyperglycemia:  - A1c 6.0% (was 6.7% in 01/2023) -Continue Semglee  10 units twice daily on 5/28 -Continue SSI-moderate every 4 hours. -Continue NovoLog  2 units every 4 hours -Further adjustment as appropriate.   AKI on CKD-3A/uremia/hyponatremia/hyperkalemia - Due to MM, hypercalcemia and ACE inhibitors?  -S/p CRRT while in ICU.   -TDC on on 5/22.  Now on HD MWF -Nephrology managing.  Currently on HD   Paroxysmal A-fib:  - Now in sinus rhythm.  Rate controlled without meds.   Unable to anticoagulate due to hematoma and intolerance.   Obstructive sleep apnea: Now  with trach collar.   Anxiety/bipolar disorder - Continue home Paxil  - Continue Depakote    Severe malnutrition Body  mass index is 27.95 kg/m. Nutrition Problem: Severe Malnutrition Etiology: acute illness (prolonged illness, interuptions in feeding) Signs/Symptoms: moderate fat depletion, moderate muscle depletion, percent weight loss (9.6% x 1 month) Percent weight loss: 9.6 % Interventions: Prostat, Tube feeding, MVI   Pressure skin injury:     Pressure Injury 07/05/23 Heel Left;Right Unstageable - Full thickness tissue loss in which the base of the injury is covered by slough (yellow, tan, gray, green or brown) and/or eschar (tan, brown or black) in the wound bed. (Active)  07/05/23 1108  Location: Heel  Location Orientation: Left;Right  Staging: Unstageable - Full thickness tissue loss in which the base of the injury is covered by slough (yellow, tan, gray, green or brown) and/or eschar (tan, brown or black) in the wound bed.  Wound Description (Comments):   Present on Admission: Yes  Dressing Type Foam - Lift dressing to assess site every shift 08/19/23 1000     Pressure Injury 07/11/23 Buttocks Mid Unstageable - Full thickness tissue loss in which the base of the injury is covered by slough (yellow, tan, gray, green or brown) and/or eschar (tan, brown or black) in the wound bed. 8 cm x 6 cmx 0.1 cm (Active)  07/11/23 1600  Location: Buttocks  Location Orientation: Mid  Staging: Unstageable - Full thickness tissue loss in which the base of the injury is covered by slough (yellow, tan, gray, green or brown) and/or eschar (tan, brown or black) in the wound bed.  Wound Description (Comments): 8 cm x 6 cmx 0.1 cm  Present on Admission: No  Dressing Type Foam - Lift dressing to assess site every shift 08/19/23 1000     Pressure Injury 07/27/23 Buttocks Right;Lower (Active)  07/27/23 1700  Location: Buttocks  Location Orientation: Right;Lower  Staging:   Wound Description (Comments):   Present on Admission: Yes  Dressing Type Foam - Lift dressing to assess site every shift 08/19/23 1000     Interval History:  Seen after returning to his room from dialysis.  Unclear if he understood my full conversation.  Followed some commands.  He appears uncomfortable but in no distress.   Old records reviewed in assessment of this patient  Antimicrobials: Voriconazole  08/05/2023 >> current  DVT prophylaxis:  heparin  injection 5,000 Units Start: 08/14/23 2200 Place and maintain sequential compression device Start: 07/14/23 1033   Code Status:   Code Status: Full Code  Mobility Assessment (Last 72 Hours)     Mobility Assessment     Row Name 08/19/23 1929 08/19/23 1500 08/19/23 1300 08/19/23 1000 08/18/23 1937   Does patient have an order for bedrest or is patient medically unstable No - Continue assessment -- No - Continue assessment No - Continue assessment No - Continue assessment   What is the highest level of mobility based on the progressive mobility assessment? Level 1 (Bedfast) - Unable to balance while sitting on edge of bed Level 1 (Bedfast) - Unable to balance while sitting on edge of bed Level 1 (Bedfast) - Unable to balance while sitting on edge of bed Level 1 (Bedfast) - Unable to balance while sitting on edge of bed Level 1 (Bedfast) - Unable to balance while sitting on edge of bed   Is the above level different from baseline mobility prior to current illness? Yes - Recommend PT order -- -- -- Yes - Recommend PT order  Row Name 08/18/23 0800 08/17/23 2300         Does patient have an order for bedrest or is patient medically unstable No - Continue assessment No - Continue assessment      What is the highest level of mobility based on the progressive mobility assessment? Level 1 (Bedfast) - Unable to balance while sitting on edge of bed Level 1 (Bedfast) - Unable to balance while sitting on edge of bed      Is the above level different from baseline mobility prior to current illness? Yes - Recommend PT order Yes - Recommend PT order               Barriers to  discharge: ? Disposition Plan:  TBD Status is: Inpt  Objective: Blood pressure (!) 118/98, pulse (!) 114, temperature 97.6 F (36.4 C), resp. rate (!) 21, height 6' 0.01" (1.829 m), weight 119 kg, SpO2 99%.  Examination:  Physical Exam Constitutional:      Comments: Chronically ill-appearing adult man lying in bed appearing uncomfortable but no distress  HENT:     Head: Normocephalic and atraumatic.     Mouth/Throat:     Mouth: Mucous membranes are dry.  Eyes:     Extraocular Movements: Extraocular movements intact.  Cardiovascular:     Rate and Rhythm: Normal rate and regular rhythm.  Pulmonary:     Effort: Pulmonary effort is normal. No respiratory distress.     Comments: Coarse breath sounds bilaterally Chest:     Comments: Right chest wall PermCath in place Abdominal:     General: Bowel sounds are normal. There is no distension.     Palpations: Abdomen is soft.     Tenderness: There is no abdominal tenderness.     Comments: Large right thigh hematoma appreciated  Genitourinary:    Comments: Rectal tube in place Musculoskeletal:     Cervical back: Normal range of motion and neck supple.  Skin:    General: Skin is warm and dry.  Neurological:     Comments: Follows some commands.  Moves all 4 extremities      Consultants:  Nephrology ID Palliative care   Procedures:    Data Reviewed: Results for orders placed or performed during the hospital encounter of 07/03/23 (from the past 24 hours)  Glucose, capillary     Status: Abnormal   Collection Time: 08/19/23  4:04 PM  Result Value Ref Range   Glucose-Capillary 119 (H) 70 - 99 mg/dL   Comment 1 Notify RN    Comment 2 Document in Chart   Glucose, capillary     Status: Abnormal   Collection Time: 08/19/23  9:46 PM  Result Value Ref Range   Glucose-Capillary 172 (H) 70 - 99 mg/dL  Lactate dehydrogenase     Status: Abnormal   Collection Time: 08/20/23  5:27 AM  Result Value Ref Range   LDH 226 (H) 98 - 192 U/L   CBC with Differential/Platelet     Status: Abnormal   Collection Time: 08/20/23  5:27 AM  Result Value Ref Range   WBC 5.6 4.0 - 10.5 K/uL   RBC 2.78 (L) 4.22 - 5.81 MIL/uL   Hemoglobin 8.9 (L) 13.0 - 17.0 g/dL   HCT 09.8 (L) 11.9 - 14.7 %   MCV 102.9 (H) 80.0 - 100.0 fL   MCH 32.0 26.0 - 34.0 pg   MCHC 31.1 30.0 - 36.0 g/dL   RDW 82.9 (H) 56.2 - 13.0 %   Platelets 194  150 - 400 K/uL   nRBC 0.5 (H) 0.0 - 0.2 %   Neutrophils Relative % 75 %   Neutro Abs 4.2 1.7 - 7.7 K/uL   Lymphocytes Relative 16 %   Lymphs Abs 0.9 0.7 - 4.0 K/uL   Monocytes Relative 8 %   Monocytes Absolute 0.5 0.1 - 1.0 K/uL   Eosinophils Relative 0 %   Eosinophils Absolute 0.0 0.0 - 0.5 K/uL   Basophils Relative 0 %   Basophils Absolute 0.0 0.0 - 0.1 K/uL   WBC Morphology MORPHOLOGY UNREMARKABLE    RBC Morphology See Note    Smear Review Normal platelet morphology    Immature Granulocytes 1 %   Abs Immature Granulocytes 0.05 0.00 - 0.07 K/uL   Polychromasia PRESENT    Basophilic Stippling PRESENT   Comprehensive metabolic panel     Status: Abnormal   Collection Time: 08/20/23  5:27 AM  Result Value Ref Range   Sodium 132 (L) 135 - 145 mmol/L   Potassium 4.6 3.5 - 5.1 mmol/L   Chloride 93 (L) 98 - 111 mmol/L   CO2 25 22 - 32 mmol/L   Glucose, Bld 115 (H) 70 - 99 mg/dL   BUN 161 (H) 6 - 20 mg/dL   Creatinine, Ser 0.96 (H) 0.61 - 1.24 mg/dL   Calcium  8.5 (L) 8.9 - 10.3 mg/dL   Total Protein 7.1 6.5 - 8.1 g/dL   Albumin  2.3 (L) 3.5 - 5.0 g/dL   AST 20 15 - 41 U/L   ALT 18 0 - 44 U/L   Alkaline Phosphatase 41 38 - 126 U/L   Total Bilirubin 0.6 0.0 - 1.2 mg/dL   GFR, Estimated 19 (L) >60 mL/min   Anion gap 14 5 - 15  Glucose, capillary     Status: Abnormal   Collection Time: 08/20/23  6:29 AM  Result Value Ref Range   Glucose-Capillary 110 (H) 70 - 99 mg/dL  Glucose, capillary     Status: Abnormal   Collection Time: 08/20/23  2:51 PM  Result Value Ref Range   Glucose-Capillary 159 (H) 70 - 99  mg/dL    I have reviewed pertinent nursing notes, vitals, labs, and images as necessary. I have ordered labwork to follow up on as indicated.  I have reviewed the last notes from staff over past 24 hours. I have discussed patient's care plan and test results with nursing staff, CM/SW, and other staff as appropriate.  Time spent: Greater than 50% of the 55 minute visit was spent in counseling/coordination of care for the patient as laid out in the A&P.   LOS: 48 days   Faith Homes, MD [facility] 08/20/2023, 2:59 PM

## 2023-08-20 NOTE — Consult Note (Signed)
 WOC team consulted for updated orders for sacral wound. This patient was seen by [facility] RN 08/19/3023, WOC follows weekly for wounds.  Orders updated.   WOC team will follow as above.   Thank you,    Ronni Colace MSN, RN-BC, [employer] [phone]

## 2023-08-20 NOTE — Hospital Course (Addendum)
 58 y.o. M with MM, pathological L humerus fracture, AF not on [facility] admitted for sepsis, anemia, encephalopathy and renal failure.     Initially presented to [facility] with 1 week generalized weakness, confusion, decreased appetite.  Found there to have sepsis, anemia and transferred here for further work up. Here developed renal failure and septic shock in setting of febrile neutropenia and ultimately found to have multiple myeloma.    4/17: Admitted to medical floor with oncology consultation to treat multiple myeloma. 4/19: transferred to ICU intubated, encephalopathic. 4/20: A-fib with RVR. Started heparin . Groin and biopsy site hematoma requiring multiple transfusions. 4/22: Started Velcade  and Cytoxan  per Oncology 5/2: LP and ETT exchange  5/8: Trach placed. Some post op bleeding improved with thrombi-pad 5/10: Voriconazole  added back based on CT Chest findings 5/13: more alert and able to follow some commands. 5/23: transferred [facility] 5/29: PEG placed 6/5: Last dose Kyprolis /Cytoxan , first dose Sarclisa  6/13: TDC removed for line holiday 6/16: new HD catheter placed 6/23: has not required HD in the past week, remove HD catheter per nephrology

## 2023-08-20 NOTE — Progress Notes (Signed)
 Everything is about the same for Frank [name].  He might be a little bit more alert whenever I talk to him.  He does have his eyes.  He does nod his head.  He does have the tube feeds which are helping.  He does get his dialysis.  I think that he will be able to get his chemotherapy.  I do appreciate everybody's help in trying to administer this.  He can have this either tomorrow or Friday.  I am not sure which day he has his dialysis.  Again, we have to try a more aggressive protocol just because his light chains are going up.  He does have a resilient population of myeloma cells that I think because of the issue.  His labs have been I have back today show cell count 5.6.  Hemoglobin 8.9.  Platelet count 194,000.  His vital signs show temperature 98.  Pulse 103.  Blood pressure 109/81.  I am not sure if he has had any temperature spikes.  His lungs sound clear bilaterally.  He has good air movement bilaterally.  Cardiac exam regular rate and rhythm.  There are no murmurs.  Abdomen soft.  Bowel sounds are active.  There is no guarding or rebound tenderness.  Extremity shows a brace on the left upper arm.  Neurological exam shows no obvious neurological deficits.  Again, we will plan for chemotherapy.  Again looks like he is on a Monday-Wednesday-Friday schedule for dialysis.  I will certainly give chemotherapy tomorrow.  I know this is incredibly complicated.  I really appreciate everybody's help in trying to treat Frank [name].  I really appreciate everybody's concern.    [name], MD  Psalm 23:4

## 2023-08-21 ENCOUNTER — Other Ambulatory Visit: Payer: Self-pay | Admitting: *Deleted

## 2023-08-21 ENCOUNTER — Other Ambulatory Visit: Payer: Self-pay | Admitting: Pharmacist

## 2023-08-21 DIAGNOSIS — N179 Acute kidney failure, unspecified: Secondary | ICD-10-CM | POA: Diagnosis not present

## 2023-08-21 DIAGNOSIS — C9000 Multiple myeloma not having achieved remission: Secondary | ICD-10-CM | POA: Diagnosis not present

## 2023-08-21 DIAGNOSIS — J9601 Acute respiratory failure with hypoxia: Secondary | ICD-10-CM | POA: Diagnosis not present

## 2023-08-21 DIAGNOSIS — Z7189 Other specified counseling: Secondary | ICD-10-CM | POA: Diagnosis not present

## 2023-08-21 LAB — GLUCOSE, CAPILLARY
Glucose-Capillary: 136 mg/dL — ABNORMAL HIGH (ref 70–99)
Glucose-Capillary: 150 mg/dL — ABNORMAL HIGH (ref 70–99)
Glucose-Capillary: 173 mg/dL — ABNORMAL HIGH (ref 70–99)
Glucose-Capillary: 207 mg/dL — ABNORMAL HIGH (ref 70–99)
Glucose-Capillary: 226 mg/dL — ABNORMAL HIGH (ref 70–99)
Glucose-Capillary: 266 mg/dL — ABNORMAL HIGH (ref 70–99)

## 2023-08-21 LAB — CBC WITH DIFFERENTIAL/PLATELET
Abs Immature Granulocytes: 0 10*3/uL (ref 0.00–0.07)
Basophils Absolute: 0 10*3/uL (ref 0.0–0.1)
Basophils Relative: 0 %
Eosinophils Absolute: 0 10*3/uL (ref 0.0–0.5)
Eosinophils Relative: 0 %
HCT: 29.8 % — ABNORMAL LOW (ref 39.0–52.0)
Hemoglobin: 9.6 g/dL — ABNORMAL LOW (ref 13.0–17.0)
Lymphocytes Relative: 18 %
Lymphs Abs: 1 10*3/uL (ref 0.7–4.0)
MCH: 33.3 pg (ref 26.0–34.0)
MCHC: 32.2 g/dL (ref 30.0–36.0)
MCV: 103.5 fL — ABNORMAL HIGH (ref 80.0–100.0)
Monocytes Absolute: 0.5 10*3/uL (ref 0.1–1.0)
Monocytes Relative: 8 %
Neutro Abs: 4.2 10*3/uL (ref 1.7–7.7)
Neutrophils Relative %: 74 %
Platelets: 211 10*3/uL (ref 150–400)
RBC: 2.88 MIL/uL — ABNORMAL LOW (ref 4.22–5.81)
RDW: 22.3 % — ABNORMAL HIGH (ref 11.5–15.5)
WBC: 5.7 10*3/uL (ref 4.0–10.5)
nRBC: 0 /100{WBCs}
nRBC: 0.4 % — ABNORMAL HIGH (ref 0.0–0.2)

## 2023-08-21 LAB — RENAL FUNCTION PANEL
Albumin: 2.5 g/dL — ABNORMAL LOW (ref 3.5–5.0)
Anion gap: 15 (ref 5–15)
BUN: 96 mg/dL — ABNORMAL HIGH (ref 6–20)
CO2: 23 mmol/L (ref 22–32)
Calcium: 8.4 mg/dL — ABNORMAL LOW (ref 8.9–10.3)
Chloride: 92 mmol/L — ABNORMAL LOW (ref 98–111)
Creatinine, Ser: 2.91 mg/dL — ABNORMAL HIGH (ref 0.61–1.24)
GFR, Estimated: 24 mL/min — ABNORMAL LOW (ref >60)
Glucose, Bld: 131 mg/dL — ABNORMAL HIGH (ref 70–99)
Phosphorus: 5.6 mg/dL — ABNORMAL HIGH (ref 2.5–4.6)
Potassium: 4.4 mmol/L (ref 3.5–5.1)
Sodium: 130 mmol/L — ABNORMAL LOW (ref 135–145)

## 2023-08-21 LAB — MAGNESIUM: Magnesium: 2.7 mg/dL — ABNORMAL HIGH (ref 1.7–2.4)

## 2023-08-21 LAB — VALPROIC ACID LEVEL: Valproic Acid Lvl: 44 ug/mL — ABNORMAL LOW (ref 50–100)

## 2023-08-21 LAB — VORICONAZOLE, SERUM: Voriconazole, Serum: 1.3 ug/mL

## 2023-08-21 MED ORDER — SODIUM CHLORIDE 0.9 % IV SOLN
1200.0000 mg | Freq: Once | INTRAVENOUS | Status: AC
  Administered 2023-08-21: 1200 mg via INTRAVENOUS
  Filled 2023-08-21: qty 50
  Filled 2023-08-21: qty 60

## 2023-08-21 MED ORDER — SODIUM CHLORIDE 0.9 % IV SOLN
1000.0000 mg | Freq: Once | INTRAVENOUS | Status: AC
  Administered 2023-08-21: 1000 mg via INTRAVENOUS
  Filled 2023-08-21: qty 50

## 2023-08-21 MED ORDER — MONTELUKAST SODIUM 10 MG PO TABS
10.0000 mg | ORAL_TABLET | Freq: Once | ORAL | Status: AC
  Administered 2023-08-21: 10 mg
  Filled 2023-08-21: qty 1

## 2023-08-21 MED ORDER — SODIUM CHLORIDE 0.9 % IV SOLN: INTRAVENOUS | Status: DC

## 2023-08-21 MED ORDER — SODIUM CHLORIDE 0.9 % IV SOLN: Freq: Once | INTRAVENOUS | Status: DC

## 2023-08-21 MED ORDER — ACETAMINOPHEN 325 MG PO TABS: 650.0000 mg | ORAL_TABLET | Freq: Once | ORAL | Status: AC

## 2023-08-21 MED ORDER — DEXTROSE 5 % IV SOLN
20.0000 mg/m2 | Freq: Once | INTRAVENOUS | Status: AC
  Administered 2023-08-21: 40 mg via INTRAVENOUS
  Filled 2023-08-21: qty 15

## 2023-08-21 MED ORDER — DIPHENHYDRAMINE HCL 50 MG/ML IJ SOLN
50.0000 mg | Freq: Once | INTRAMUSCULAR | Status: AC
  Administered 2023-08-21: 50 mg via INTRAVENOUS
  Filled 2023-08-21: qty 1

## 2023-08-21 MED ORDER — SODIUM CHLORIDE 0.9 % IV SOLN
20.0000 mg | Freq: Once | INTRAVENOUS | Status: AC
  Administered 2023-08-21: 20 mg via INTRAVENOUS
  Filled 2023-08-21: qty 2

## 2023-08-21 MED ORDER — FAMOTIDINE IN NACL 20-0.9 MG/50ML-% IV SOLN
20.0000 mg | Freq: Once | INTRAVENOUS | Status: AC
  Administered 2023-08-21: 20 mg via INTRAVENOUS
  Filled 2023-08-21: qty 50

## 2023-08-21 NOTE — Progress Notes (Signed)
 PT Cancellation Note  Patient Details Name: Frank [name]. MRN: [id number] DOB: [birth year]   Cancelled Treatment:    Reason Eval/Treat Not Completed: Medical issues which prohibited therapy (Pt undergoing chemo. RN requests PT to hold at this time.)   [name] 08/21/2023, 11:34 AM

## 2023-08-21 NOTE — Progress Notes (Signed)
 Overall, Frank [name] is about the same.  I think he may have had his dialysis yesterday.  He has been seen by the wound care team.  He is going to have his chemotherapy today.  I really appreciate everybody's help with this.  I know it is incredibly difficult to do chemotherapy over at [facility].  However, I really believe that he needs this given the fact that he has had a rise in his light chains.  He does seem a little more alert today.  He does answer some questions by nodding his head.  He is getting his tube feeds.  I think he is doing well with the tube feeds.  There are no labs today.  Labs done yesterday.  Everything looks relatively stable.  His white cell count is 5.6.  Hemoglobin 8.9.  Platelet count 194,000.   His vital signs are all stable.  Temperature 98.4.  Pulse 116.  Blood pressure 113/88.  His lungs are clear bilaterally.  Cardiac exam is tachycardic but regular.  He has no murmurs.  Abdomen is soft.  His abdomen is somewhat obese.  He has no fluid wave.  There is no palpable liver or spleen tip.  Extremities shows the brace for the left arm.  His legs have some mild chronic edema.   Again, we are going to be more aggressive with our therapy.  I will go ahead with Sarclisa/Kyprolis/Cytoxan .  I think this should be a very good regimen for him.  Again he has complex cytogenetics which I think the Sarclisa would be adequate for.  I think the Kyprolis can start to try to help overcome any resilience that he has had from past treatment.  Hopefully, he will be able to go home soon.  Please, be able to go to some rehab or maybe long-term facility.  I do appreciate everybody's help.  I will continue to pray about all of this.   [name], MD  [name] 1:37

## 2023-08-21 NOTE — Progress Notes (Addendum)
 Ok to proceed with treatment today with elevated HR, Scr 2.91, 08/21/23 CBC with ANC pending, and Cmet from 08/20/23. Ok to remove NS IV hydration for Kyprolis. Ok to change Apap and Montelukast  to per tube. Ok to increase Cytoxan  and Sarclisa based on most recent weight.  Jobe Mulder [place], [place], BCPS, BCOP 08/21/2023 7:59 AM

## 2023-08-21 NOTE — Progress Notes (Signed)
 Chemo RN presented to patient room. Patient identified using two separate identifiers. Verified pre-treatment RBC phenotype drawn. Consent for isatuximab, cyclophosphamide , and carfilzomib obtained via telephone from patient's wife, Jagar Lua. Questions answered and role of Chemo RN explained. 24G IV placed in right wrist, blood return noted, flushed easily. Pre-medications administered and allowed time to take effect. Treatment administered. No change in patient's condition throughout treatment. Patient tolerated slow titration of isatuximab without issue. Primary RN, Amy, made aware treatment complete.

## 2023-08-21 NOTE — Progress Notes (Signed)
 Pharmacy Antibiotic Note  Frank [name]. is a 58 y.o. male admitted on 07/03/2023 with generalized weakness (multifactorial secondary to hypercalcemia, suspected malignancy, multiple myeloma).  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   A voriconazole  trough was drawn 6/3 which resulted as 1.3 mcg/ml and is therapeutic (goal 1-5 mcg/ml) which confirms low level on 5/23was related to binding from tube feeds.   Level history: 08/01/23 1.9 mcg/ml on 350 mg/12h 08/08/23 0.4 mcg/ml on 350 mg/12h 08/19/22 1.3 mcg/ml >> continue 350 mg/12h  The patient continues on bolus feeds - timing adjusted to avoid 1hr before/after voriconazole  administration.  Plan: - Continue voriconazole  350 mg BID per tube - Will continue to monitor voriconazole  and levels as indicated  Height: 6' 0.01" (182.9 cm) Weight: 119 kg (262 lb 5.6 oz) IBW/kg (Calculated) : 77.62  Temp (24hrs), Avg:98.4 F (36.9 C), Min:97.6 F (36.4 C), Max:99.3 F (37.4 C)  Recent Labs  Lab 08/17/23 0359 08/18/23 0530 08/18/23 0531 08/19/23 0413 08/20/23 0527 08/21/23 0603  WBC 8.9 8.4  --  8.7 5.6 5.7  CREATININE  --  3.59* 3.51* 2.60* 3.53* 2.91*    Estimated Creatinine Clearance: 36.4 mL/min (A) (by C-G formula based on SCr of 2.91 mg/dL (H)).    Allergies  Allergen Reactions   Morphine And Codeine Anaphylaxis   Shellfish Allergy Anaphylaxis   Betadine  [Povidone Iodine ] Itching   Bupropion Other (See Comments)    Shaking of the body, hallucinations    Chlorhexidine  Hives    Patient reports never had issues CHG with mouth rinse.   Influenza Vaccines Hives   Metoprolol  Rash     Thank you for allowing pharmacy to be a part of this patient's care.  Garland Junk, PharmD, BCPS, BCIDP Infectious Diseases Clinical Pharmacist 08/21/2023 9:39 AM   **Pharmacist phone directory can now be found on [email or web address] ([email or web address]).  Listed under [facility].

## 2023-08-21 NOTE — Progress Notes (Signed)
 Progress Note    Frank [name].   WUJ:[id number]  DOB: [birth year]  DOA: 07/03/2023     48 PCP: Serita Danes, MD  Initial CC: Weakness  Hospital Course: Frank [name] is a 58 year old male with PMH of OSA on CPAP, pathological fracture and multiple ORIF left humerus likely due to multiple myeloma, CKD-3A, A-fib not on AC, asthma, HTN, HLD and tobacco use disorder who had been feeling weak for about 1 week, admitted to [facility] with concern for sepsis, community-acquired pneumonia and acute encephalopathy.  Patient was found to have lytic lesions, he was transferred to [facility] for oncology treatment.  In the meantime, he developed encephalopathy, acute renal failure. He remained in the ICU for 36 days.  Underwent tracheostomy. Deemed to be not stable for PEG tube placement.  He has been on tube feeds via cortrack.  Also started on dialysis after CRRT.   Significant events 4/17-admitted to medical floor with oncology consultation to treat multiple myeloma. 4/19-transferred to ICU intubated, encephalopathic. 4/20-A-fib with RVR.  Started heparin . Groin and biopsy site hematoma requiring multiple transfusions EEG 4/25 >> moderate to severe encephalopathy without any seizures or epileptiform discharges 4/29 Received Velcade  and Cytoxan  chemotherapy-intent palliative? 5/2 LP and ETT exchange  5/8 Trach placed. Some post op bleeding improved with thrombi-pad 5/10 voriconazole  added back based on CT Chest findings 5/13 more alert and able to follow some commands. 5/23, transferred to floor. 5/29-PEG placed 5/31 chemotherapy changed to Sarclisa/Kyprolis/Cytoxan .  Assessment and plan:  Multiple myeloma: Initially started on Velcade  and Cytoxan .  Chemotherapy changed to Sarclisa/Kyprolis/Cytoxan  on 5/31 - Oncology following  Goal of care - Poor long-term prognosis.   - Palliative from prior providers had discussion with patient's wife who is an EMT - Remains full  code with full scope of care - need to revisit goals when he has further deterioration  Acute hypoxic respiratory failure -Pulmonology following intermittently; recommended keeping trach collar.  On #6 cuffed trach -Continue chest physiotherapy  Right thigh hematoma - per CT on 08/01/23: "12.1 x 30.2 x 7.6 cm subcutaneous hematoma within the anterior right thigh. 8.4 x 10.4 x 22.2 cm subcutaneous hematoma within the right gluteal region - continue supportive care - trend CBC   Severe acute metabolic encephalopathy with hypoactive delirium Seizure disorder - Normal MRI brain.  LP negative for meningitis or encephalitis.  EEG negative for seizure but encephalopathy.   - Home Depakote  discontinued in ICU but resumed later.  Markedly elevated BUN -Reorientation and delirium precaution -Discontinued Klonopin  on 5/30 -Depacon  resumed on 5/25 -Hemodialysis for uremia   Recurrent fever/SIRS - Continues to spike fever with some tachycardia and tachypnea.  Normotensive - Likely paraneoplastic.  He is also at risk for aspiration pneumonitis but respiratory status stable -Symptomatic management   Aspergillosis with pneumonia -On voriconazole  per ID.  ID to decide duration -Also on acyclovir    NIDDM-2 with hyperglycemia:  - A1c 6.0% (was 6.7% in 01/2023) -Continue Semglee  and SSI -Further adjustment as appropriate.   AKI on CKD-3A/uremia/hyponatremia/hyperkalemia - Due to MM, hypercalcemia and ACE inhibitors?  -S/p CRRT while in ICU.   -TDC on on 5/22.  Now on HD MWF -Nephrology managing.  Currently on HD   Paroxysmal A-fib:  - Now in sinus rhythm. Rate controlled without meds. Unable to anticoagulate due to hematoma and intolerance.   Obstructive sleep apnea: Now with trach collar   Anxiety/bipolar disorder - Continue home Paxil  - Continue Depakote    Severe malnutrition Body mass  index is 27.95 kg/m. Nutrition Problem: Severe Malnutrition Etiology: acute illness (prolonged  illness, interuptions in feeding) Signs/Symptoms: moderate fat depletion, moderate muscle depletion, percent weight loss (9.6% x 1 month) Percent weight loss: 9.6 % Interventions: Prostat, Tube feeding, MVI   Pressure skin injury:     Pressure Injury 07/05/23 Heel Left;Right Unstageable - Full thickness tissue loss in which the base of the injury is covered by slough (yellow, tan, gray, green or brown) and/or eschar (tan, brown or black) in the wound bed. (Active)  07/05/23 1108  Location: Heel  Location Orientation: Left;Right  Staging: Unstageable - Full thickness tissue loss in which the base of the injury is covered by slough (yellow, tan, gray, green or brown) and/or eschar (tan, brown or black) in the wound bed.  Wound Description (Comments):   Present on Admission: Yes  Dressing Type Foam - Lift dressing to assess site every shift 08/19/23 1000     Pressure Injury 07/11/23 Buttocks Mid Unstageable - Full thickness tissue loss in which the base of the injury is covered by slough (yellow, tan, gray, green or brown) and/or eschar (tan, brown or black) in the wound bed. 8 cm x 6 cmx 0.1 cm (Active)  07/11/23 1600  Location: Buttocks  Location Orientation: Mid  Staging: Unstageable - Full thickness tissue loss in which the base of the injury is covered by slough (yellow, tan, gray, green or brown) and/or eschar (tan, brown or black) in the wound bed.  Wound Description (Comments): 8 cm x 6 cmx 0.1 cm  Present on Admission: No  Dressing Type Foam - Lift dressing to assess site every shift 08/19/23 1000     Pressure Injury 07/27/23 Buttocks Right;Lower (Active)  07/27/23 1700  Location: Buttocks  Location Orientation: Right;Lower  Staging:   Wound Description (Comments):   Present on Admission: Yes  Dressing Type Foam - Lift dressing to assess site every shift 08/19/23 1000    Interval History:  No events overnight.  Had dialysis yesterday and undergoing chemo today per oncology.   Resting comfortably in bed and nods head appropriately to questions this morning.   Old records reviewed in assessment of this patient  Antimicrobials: Voriconazole  08/05/2023 >> current  DVT prophylaxis:  heparin  injection 5,000 Units Start: 08/14/23 2200 Place and maintain sequential compression device Start: 07/14/23 1033   Code Status:   Code Status: Full Code  Mobility Assessment (Last 72 Hours)     Mobility Assessment     Row Name 08/21/23 0800 08/21/23 0400 08/21/23 0000 08/20/23 2000 08/19/23 1929   Does patient have an order for bedrest or is patient medically unstable No - Continue assessment No - Continue assessment No - Continue assessment No - Continue assessment No - Continue assessment   What is the highest level of mobility based on the progressive mobility assessment? Level 1 (Bedfast) - Unable to balance while sitting on edge of bed Level 1 (Bedfast) - Unable to balance while sitting on edge of bed Level 1 (Bedfast) - Unable to balance while sitting on edge of bed Level 1 (Bedfast) - Unable to balance while sitting on edge of bed Level 1 (Bedfast) - Unable to balance while sitting on edge of bed   Is the above level different from baseline mobility prior to current illness? Yes - Recommend PT order Yes - Recommend PT order Yes - Recommend PT order Yes - Recommend PT order Yes - Recommend PT order    Row Name 08/19/23 1500 08/19/23 1300 08/19/23  1000 08/18/23 1937     Does patient have an order for bedrest or is patient medically unstable -- No - Continue assessment No - Continue assessment No - Continue assessment    What is the highest level of mobility based on the progressive mobility assessment? Level 1 (Bedfast) - Unable to balance while sitting on edge of bed Level 1 (Bedfast) - Unable to balance while sitting on edge of bed Level 1 (Bedfast) - Unable to balance while sitting on edge of bed Level 1 (Bedfast) - Unable to balance while sitting on edge of bed    Is the  above level different from baseline mobility prior to current illness? -- -- -- Yes - Recommend PT order             Barriers to discharge: ? Disposition Plan:  TBD Status is: Inpt  Objective: Blood pressure 106/82, pulse (!) 108, temperature 100.2 F (37.9 C), temperature source Oral, resp. rate 20, height 6' 0.01" (1.829 m), weight 119 kg, SpO2 96%.  Examination:  Physical Exam Constitutional:      Comments: Chronically ill-appearing adult man lying in bed appearing uncomfortable but no distress  HENT:     Head: Normocephalic and atraumatic.     Mouth/Throat:     Mouth: Mucous membranes are dry.  Eyes:     Extraocular Movements: Extraocular movements intact.  Cardiovascular:     Rate and Rhythm: Normal rate and regular rhythm.  Pulmonary:     Effort: Pulmonary effort is normal. No respiratory distress.     Comments: Coarse breath sounds bilaterally Chest:     Comments: Right chest wall PermCath in place Abdominal:     General: Bowel sounds are normal. There is no distension.     Palpations: Abdomen is soft.     Tenderness: There is no abdominal tenderness.     Comments: Large right thigh hematoma appreciated  Genitourinary:    Comments: Rectal tube in place Musculoskeletal:     Cervical back: Normal range of motion and neck supple.  Skin:    General: Skin is warm and dry.  Neurological:     Comments: Follows some commands.  Moves all 4 extremities      Consultants:  Nephrology ID Palliative care  Oncology  Procedures:    Data Reviewed: Results for orders placed or performed during the hospital encounter of 07/03/23 (from the past 24 hours)  Glucose, capillary     Status: Abnormal   Collection Time: 08/20/23  4:40 PM  Result Value Ref Range   Glucose-Capillary 223 (H) 70 - 99 mg/dL   Comment 1 Notify RN    Comment 2 Document in Chart   Glucose, capillary     Status: Abnormal   Collection Time: 08/20/23  9:22 PM  Result Value Ref Range    Glucose-Capillary 141 (H) 70 - 99 mg/dL   Comment 1 Notify RN   Renal function panel     Status: Abnormal   Collection Time: 08/21/23  6:03 AM  Result Value Ref Range   Sodium 130 (L) 135 - 145 mmol/L   Potassium 4.4 3.5 - 5.1 mmol/L   Chloride 92 (L) 98 - 111 mmol/L   CO2 23 22 - 32 mmol/L   Glucose, Bld 131 (H) 70 - 99 mg/dL   BUN 96 (H) 6 - 20 mg/dL   Creatinine, Ser 1.61 (H) 0.61 - 1.24 mg/dL   Calcium  8.4 (L) 8.9 - 10.3 mg/dL   Phosphorus 5.6 (H) 2.5 -  4.6 mg/dL   Albumin  2.5 (L) 3.5 - 5.0 g/dL   GFR, Estimated 24 (L) >60 mL/min   Anion gap 15 5 - 15  CBC with Differential/Platelet     Status: Abnormal   Collection Time: 08/21/23  6:03 AM  Result Value Ref Range   WBC 5.7 4.0 - 10.5 K/uL   RBC 2.88 (L) 4.22 - 5.81 MIL/uL   Hemoglobin 9.6 (L) 13.0 - 17.0 g/dL   HCT 16.1 (L) 09.6 - 04.5 %   MCV 103.5 (H) 80.0 - 100.0 fL   MCH 33.3 26.0 - 34.0 pg   MCHC 32.2 30.0 - 36.0 g/dL   RDW 40.9 (H) 81.1 - 91.4 %   Platelets 211 150 - 400 K/uL   nRBC 0.4 (H) 0.0 - 0.2 %   Neutrophils Relative % 74 %   Neutro Abs 4.2 1.7 - 7.7 K/uL   Lymphocytes Relative 18 %   Lymphs Abs 1.0 0.7 - 4.0 K/uL   Monocytes Relative 8 %   Monocytes Absolute 0.5 0.1 - 1.0 K/uL   Eosinophils Relative 0 %   Eosinophils Absolute 0.0 0.0 - 0.5 K/uL   Basophils Relative 0 %   Basophils Absolute 0.0 0.0 - 0.1 K/uL   WBC Morphology See Note    Smear Review See Note    nRBC 0 0 /100 WBC   Abs Immature Granulocytes 0.00 0.00 - 0.07 K/uL   Polychromasia PRESENT   Magnesium      Status: Abnormal   Collection Time: 08/21/23  6:03 AM  Result Value Ref Range   Magnesium  2.7 (H) 1.7 - 2.4 mg/dL  Glucose, capillary     Status: Abnormal   Collection Time: 08/21/23  6:15 AM  Result Value Ref Range   Glucose-Capillary 150 (H) 70 - 99 mg/dL   Comment 1 Notify RN   Glucose, capillary     Status: Abnormal   Collection Time: 08/21/23  7:14 AM  Result Value Ref Range   Glucose-Capillary 136 (H) 70 - 99 mg/dL   Valproic  acid level     Status: Abnormal   Collection Time: 08/21/23 10:44 AM  Result Value Ref Range   Valproic  Acid Lvl 44 (L) 50 - 100 ug/mL  Glucose, capillary     Status: Abnormal   Collection Time: 08/21/23 11:13 AM  Result Value Ref Range   Glucose-Capillary 207 (H) 70 - 99 mg/dL    I have reviewed pertinent nursing notes, vitals, labs, and images as necessary. I have ordered labwork to follow up on as indicated.  I have reviewed the last notes from staff over past 24 hours. I have discussed patient's care plan and test results with nursing staff, CM/SW, and other staff as appropriate.  Time spent: Greater than 50% of the 55 minute visit was spent in counseling/coordination of care for the patient as laid out in the A&P.   LOS: 49 days   Faith Homes, MD [facility] 08/21/2023, 2:58 PM

## 2023-08-21 NOTE — Progress Notes (Signed)
  [facility] Progress Note   Assessment/ Plan:   # Oliguric AKI on CKD 3a - b/l creat 1.1- 1.5 from 2024. Creat here 5.8 on presentation. AKI due to myeloma kidney +/- hyperCa +/- ACEi. CRRT from 4/23--- 5/10.  on iHD now.    - next HD Friday.  Hoping to stay on MWF schedule to optimize chemo schedule  # AMS/acute metabolic encephalopathy: MRI, LP completed and unrevealing.  Waxes and wanes    #Volume -   looking fairly euvolemic now   # Multiple myeloma: therapy recently intensified by onc due to sub optimal response to initial therapy   #AHRF/ aspergillus PNA/ s/p trach 5/8: on voriconazole  IV, off all other IV antibiotics. Off the ventilator on trach collar, per CCM   # Atrial fib-  on amio   #Anemia - prn transfusions. On epogen  per onc.   Dispo:  full code, pall care has been involved, for now full scope  Subjective:   Remains anuric.  Tolerated HD without issue yesterday.    Objective:   BP (!) 116/91 (BP Location: Right Arm)   Pulse (!) 105   Temp 99.3 F (37.4 C) (Axillary)   Resp (!) 23   Ht 6' 0.01" (1.829 m)   Wt 119 kg   SpO2 97%   BMI 35.57 kg/m   Intake/Output Summary (Last 24 hours) at 08/21/2023 0923 Last data filed at 08/21/2023 0600 Gross per 24 hour  Intake 767.5 ml  Output 80 ml  Net 687.5 ml   Weight change:   Physical Exam: Gen: comfortable, frail CVS: RRR Resp: on TC  Abd: soft Ext: no LE edema ACCESS: TDC  Imaging: No results found.    Labs: BMET Recent Labs  Lab 08/15/23 [phone] 08/16/23 0417 08/17/23 0351 08/18/23 0530 08/18/23 0531 08/19/23 0413 08/20/23 0527 08/21/23 0603  NA 134* 131* 131* 132* 131* 131* 132* 130*  K 5.1 4.6 4.8 5.2* 5.2* 4.6 4.6 4.4  CL 95* 91* 93* 93* 92* 94* 93* 92*  CO2 21* 23 24 23 23 25 25 23   GLUCOSE 120* 163* 127* 121* 121* 117* 115* 131*  BUN 166* 128* 95* 149* 150* 95* 155* 96*  CREATININE 4.13* 3.23* 2.63* 3.59* 3.51* 2.60* 3.53* 2.91*  CALCIUM  8.3* 8.1* 8.0* 8.2* 8.2* 8.1*  8.5* 8.4*  PHOS 7.5* 6.6* 5.8*  --  7.1* 6.1*  --  5.6*   CBC Recent Labs  Lab 08/18/23 0530 08/19/23 0413 08/20/23 0527 08/21/23 0603  WBC 8.4 8.7 5.6 5.7  NEUTROABS 7.1 7.1 4.2 4.2  HGB 9.7* 9.9* 8.9* 9.6*  HCT 30.8* 31.3* 28.6* 29.8*  MCV 101.7* 102.3* 102.9* 103.5*  PLT 177 183 194 211    Medications:     acyclovir   200 mg Per Tube BID   arformoterol   15 mcg Nebulization BID   carfilzomib  20 mg/m2 (Treatment Plan Recorded) Intravenous Once   collagenase    Topical Daily   cyclophosphamide   1,000 mg Intravenous Once   diphenhydrAMINE   50 mg Intravenous Once   feeding supplement (NEPRO CARB STEADY)  355 mL Per Tube TID PC & HS   feeding supplement (PROSource TF20)  60 mL Per Tube Daily   fiber  1 packet Per Tube BID   folic acid   1 mg Per Tube Daily   heparin  injection (subcutaneous)  5,000 Units Subcutaneous Q8H   insulin  aspart  0-15 Units Subcutaneous TID AC & HS   insulin  aspart  2 Units Subcutaneous TID AC & HS  insulin  glargine-yfgn  10 Units Subcutaneous BID   isatuximab-irfc (SARCLISA) 1,200 mg in sodium chloride  0.9 % 190 mL (4.8 mg/mL) chemo infusion  1,200 mg Intravenous Once   montelukast   5 mg Per Tube QHS   multivitamin  1 tablet Per Tube QHS   nicotine   7 mg Transdermal Daily   nutrition supplement (JUVEN)  1 packet Per Tube BID BM   mouth rinse  15 mL Mouth Rinse 4 times per day   PARoxetine   40 mg Per Tube Daily   pneumococcal 20-valent conjugate vaccine  0.5 mL Intramuscular Tomorrow-1000   revefenacin   175 mcg Nebulization Daily   sodium chloride  flush  10-40 mL Intracatheter Q12H   thiamine   100 mg Per Tube Daily   voriconazole   350 mg Per Tube Q12H   Adrian Alba MD [facility] Pager [phone]

## 2023-08-21 NOTE — Progress Notes (Signed)
 Speech Language Pathology Treatment: Noelia Batman Speaking valve  Patient Details Name: Frank [name]. MRN: [id number] DOB: February 19, 1966 Today's Date: 08/21/2023 Time: 4098-1191 SLP Time Calculation (min) (ACUTE ONLY): 13 min  Assessment / Plan / Recommendation Clinical Impression  Pt receiving chemotherapy drug in room and SLP confirmed with RN and chemo RN prior to seeing. Session was brief due to pt's significant lethargy with RN reporting he received 50 mg Benadryl  recently. SLP provided oral care prior to placement of valve with pt motioning his hand to stop but not adequately able to wake. Valve worn for approximately 11 minutes with vitals in normal range and respirations stable. Pt did not cough, no evidence of pharyngeal/laryngeal secretions or back pressure when removed x 2. Therapist used cold cloth to face and trap squeeze resulting in pt verbalizing "stop" and "god ___". Vocalization was hoarse but intensity was adequate possibly due to frustration and taking a larger breath. ST will continue for PMV and swallow (when appropriate).     HPI HPI: 58 y.o. male transferred from [facility] 07/03/23 to [facility] for management of newly diagnosed plasma cell myeloma with weakness and AMS. Pt also with AKI and metabolic encephalopathy. 5/10 transfer to [facility] for iHD.Bedside swallow eval 4/18 wiht recs for NPO due to somnolence. 4/22 Intubated and chemo initiated. 4/23-5/10 CRRT. 5/6 IVIG started. 5/8 trach. 5/12 iHD initiated. 5/18 inability to oxygenate through trach, intubated and trach revision. PMHx:Lt THA, carpal tunnel syndrome, Lt TKA, Lt humerus fx, PAF, HTN, HLD, bipolar disorder, obesity, T2DM      SLP Plan  Continue with current plan of care          Recommendations         Patient may use Passy-Muir Speech Valve: During all therapies with supervision;Intermittently with supervision PMSV Supervision: Full MD: Please consider changing trach tube to : Cuffless           Oral  care QID     [place] (R49.1)     Continue with current plan of care     Frank [name]  08/21/2023, 12:23 PM

## 2023-08-22 DIAGNOSIS — C9000 Multiple myeloma not having achieved remission: Secondary | ICD-10-CM | POA: Diagnosis not present

## 2023-08-22 DIAGNOSIS — J9601 Acute respiratory failure with hypoxia: Secondary | ICD-10-CM | POA: Diagnosis not present

## 2023-08-22 DIAGNOSIS — N179 Acute kidney failure, unspecified: Secondary | ICD-10-CM | POA: Diagnosis not present

## 2023-08-22 DIAGNOSIS — Z7189 Other specified counseling: Secondary | ICD-10-CM | POA: Diagnosis not present

## 2023-08-22 LAB — CBC WITH DIFFERENTIAL/PLATELET
Abs Immature Granulocytes: 0.03 10*3/uL (ref 0.00–0.07)
Basophils Absolute: 0 10*3/uL (ref 0.0–0.1)
Basophils Relative: 0 %
Eosinophils Absolute: 0 10*3/uL (ref 0.0–0.5)
Eosinophils Relative: 0 %
HCT: 31.8 % — ABNORMAL LOW (ref 39.0–52.0)
Hemoglobin: 10.3 g/dL — ABNORMAL LOW (ref 13.0–17.0)
Immature Granulocytes: 1 %
Lymphocytes Relative: 10 %
Lymphs Abs: 0.5 10*3/uL — ABNORMAL LOW (ref 0.7–4.0)
MCH: 33 pg (ref 26.0–34.0)
MCHC: 32.4 g/dL (ref 30.0–36.0)
MCV: 101.9 fL — ABNORMAL HIGH (ref 80.0–100.0)
Monocytes Absolute: 0.3 10*3/uL (ref 0.1–1.0)
Monocytes Relative: 6 %
Neutro Abs: 4 10*3/uL (ref 1.7–7.7)
Neutrophils Relative %: 83 %
Platelets: 194 10*3/uL (ref 150–400)
RBC: 3.12 MIL/uL — ABNORMAL LOW (ref 4.22–5.81)
RDW: 22.4 % — ABNORMAL HIGH (ref 11.5–15.5)
WBC: 4.8 10*3/uL (ref 4.0–10.5)
nRBC: 0 % (ref 0.0–0.2)

## 2023-08-22 LAB — GLUCOSE, CAPILLARY
Glucose-Capillary: 135 mg/dL — ABNORMAL HIGH (ref 70–99)
Glucose-Capillary: 144 mg/dL — ABNORMAL HIGH (ref 70–99)
Glucose-Capillary: 154 mg/dL — ABNORMAL HIGH (ref 70–99)
Glucose-Capillary: 182 mg/dL — ABNORMAL HIGH (ref 70–99)
Glucose-Capillary: 201 mg/dL — ABNORMAL HIGH (ref 70–99)
Glucose-Capillary: 210 mg/dL — ABNORMAL HIGH (ref 70–99)

## 2023-08-22 LAB — RENAL FUNCTION PANEL
Albumin: 2.5 g/dL — ABNORMAL LOW (ref 3.5–5.0)
Anion gap: 16 — ABNORMAL HIGH (ref 5–15)
BUN: 143 mg/dL — ABNORMAL HIGH (ref 6–20)
CO2: 20 mmol/L — ABNORMAL LOW (ref 22–32)
Calcium: 8.1 mg/dL — ABNORMAL LOW (ref 8.9–10.3)
Chloride: 94 mmol/L — ABNORMAL LOW (ref 98–111)
Creatinine, Ser: 3.8 mg/dL — ABNORMAL HIGH (ref 0.61–1.24)
GFR, Estimated: 17 mL/min — ABNORMAL LOW (ref >60)
Glucose, Bld: 131 mg/dL — ABNORMAL HIGH (ref 70–99)
Phosphorus: 6.2 mg/dL — ABNORMAL HIGH (ref 2.5–4.6)
Potassium: 4.4 mmol/L (ref 3.5–5.1)
Sodium: 130 mmol/L — ABNORMAL LOW (ref 135–145)

## 2023-08-22 LAB — MAGNESIUM: Magnesium: 3 mg/dL — ABNORMAL HIGH (ref 1.7–2.4)

## 2023-08-22 MED ORDER — ALBUMIN HUMAN 25 % IV SOLN
INTRAVENOUS | Status: AC
  Filled 2023-08-22: qty 100

## 2023-08-22 MED ORDER — HEPARIN SODIUM (PORCINE) 1000 UNIT/ML IJ SOLN
3800.0000 [IU] | Freq: Once | INTRAMUSCULAR | Status: AC
  Administered 2023-08-22: 3800 [IU]

## 2023-08-22 MED ORDER — HEPARIN SODIUM (PORCINE) 1000 UNIT/ML IJ SOLN
INTRAMUSCULAR | Status: AC
  Filled 2023-08-22: qty 8

## 2023-08-22 MED ORDER — ALBUMIN HUMAN 25 % IV SOLN
25.0000 g | Freq: Once | INTRAVENOUS | Status: AC
  Administered 2023-08-22: 25 g via INTRAVENOUS

## 2023-08-22 MED ORDER — HEPARIN SODIUM (PORCINE) 1000 UNIT/ML IJ SOLN
1000.0000 [IU] | Freq: Once | INTRAMUSCULAR | Status: AC
  Administered 2023-08-22: 1000 [IU] via INTRAVENOUS

## 2023-08-22 MED ORDER — HEPARIN SODIUM (PORCINE) 1000 UNIT/ML IJ SOLN
3000.0000 [IU] | Freq: Once | INTRAMUSCULAR | Status: AC
  Administered 2023-08-22: 3000 [IU] via INTRAVENOUS

## 2023-08-22 NOTE — Progress Notes (Signed)
 PT Cancellation Note  Patient Details Name: Frank [name]. MRN: [id number] DOB: 1965/05/30   Cancelled Treatment:    Reason Eval/Treat Not Completed: Patient at procedure or test/unavailable (Pt in HD this AM. Will attempt as able and appropriate.)  Sloan Duncans, DPT, CLT  Acute Rehabilitation Services Office: [phone] (Secure chat preferred)   Jenice Mitts 08/22/2023, 9:14 AM

## 2023-08-22 NOTE — Progress Notes (Signed)
   08/22/23 1300  Vitals  Temp 99.9 F (37.7 C)  Pulse Rate (!) 119  Resp 18  BP 123/78  SpO2 99 %  O2 Device Tracheostomy Collar  Weight  (unable to weigh)  Oxygen Therapy  O2 Flow Rate (L/min) 6 L/min  Patient Activity (if Appropriate) In bed  Pulse Oximetry Type Continuous  Oximetry Probe Site Changed No  Post Treatment  Dialyzer Clearance Lightly streaked  Hemodialysis Intake (mL) 100 mL  Liters Processed 84  Fluid Removed (mL) 1.4 mL  Tolerated HD Treatment Yes  Post-Hemodialysis Comments HD tolerated   Received patient in bed to unit.  Alert.  Informed consent signed and in chart.    TX duration:4Hrs   Patient tolerated well.  Transported back to the room  Alert, without acute distress.  Hand-off given to patient's nurse.    Access used: CVC  Access issues: none   Total UF removed: Medication(s) given: see eMAR.  Dressing changed

## 2023-08-22 NOTE — Progress Notes (Signed)
 PT Cancellation Note  Patient Details Name: Frank [name]. MRN: [id number] DOB: [birth year]   Cancelled Treatment:    Reason Eval/Treat Not Completed: Fatigue/lethargy limiting ability to participate (Pt reports significant lethargy/fatigue after HD today. Would like for physical therapy to try to return tomorrow. Will follow up as able and appropriate.)  Sloan Duncans, DPT, CLT  Acute Rehabilitation Services Office: [phone] (Secure chat preferred)   Jenice Mitts 08/22/2023, 2:50 PM

## 2023-08-22 NOTE — Procedures (Signed)
 Patient seen on Hemodialysis; hypotensive and albumin  ordered. BP (!) 84/72   Pulse (!) 117   Temp 98.9 F (37.2 C)   Resp (!) 31   Ht 6' 0.01" (1.829 m)   Wt 119 kg   SpO2 97%   BMI 35.57 kg/m   QB 350, UF goal 1.5L Tolerating treatment without complaints at this time.   Clevester Dally MD [facility]. Office # [phone] Pager # [phone] 10:29 AM

## 2023-08-22 NOTE — Progress Notes (Signed)
 Patient ID: Frank Batman., male   DOB: 1966-01-07, 58 y.o.   MRN: [id number] [facility] Progress Note   Assessment/ Plan:   1. Acute kidney Injury on CKD3a: secondary to myeloma kidney. Transitioned to IHD on MWF schedule after previously being on CRRT. No indications of renal recovery so far and does not appear to be in a position to be safely placed at OP HD unit.  2. Acute metabolic encephalopathy: appears to wax and wane. Awake/alert at dialysis today.  3. Multiple myeloma: Closely followed by Dr. Maria Shiner and started on aggressive regimen with Sarclisa, Kyprolis and Cytoxan .  4. 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 On tracheal collar s/p acute hypoxic respiratory failure.  5. Anemia: from acute illness and multiple myeloma. On ESA per oncology supervision.   Subjective:   Hypotensive in dialysis- albumin  bolus ordered   Objective:   BP (!) 84/72   Pulse (!) 117   Temp 98.9 F (37.2 C)   Resp (!) 31   Ht 6' 0.01" (1.829 m)   Wt 119 kg   SpO2 97%   BMI 35.57 kg/m   Intake/Output Summary (Last 24 hours) at 08/22/2023 1018 Last data filed at 08/22/2023 0729 Gross per 24 hour  Intake 1013.84 ml  Output 830 ml  Net 183.84 ml   Weight change:   Physical Exam: WUJ:WJXBJYNWGNF resting in dialysis, nods head to questions AOZ:HYQMV regular tachycardia, normal S1 and S2 Resp: on tracheal collar, no rales/rhonchi audible. RIJ TDC connected to HD HQI:ONGE, flat, non-tender Ext:No LE edema. Legs in protective booties  Imaging: No results found.  Labs: BMET Recent Labs  Lab 08/16/23 0417 08/17/23 0351 08/18/23 0530 08/18/23 0531 08/19/23 0413 08/20/23 0527 08/21/23 0603 08/22/23 0324  NA 131* 131* 132* 131* 131* 132* 130* 130*  K 4.6 4.8 5.2* 5.2* 4.6 4.6 4.4 4.4  CL 91* 93* 93* 92* 94* 93* 92* 94*  CO2 23 24 23 23 25 25 23  20*  GLUCOSE 163* 127* 121* 121* 117* 115* 131* 131*  BUN 128* 95* 149* 150* 95* 155*  96* 143*  CREATININE 3.23* 2.63* 3.59* 3.51* 2.60* 3.53* 2.91* 3.80*  CALCIUM  8.1* 8.0* 8.2* 8.2* 8.1* 8.5* 8.4* 8.1*  PHOS 6.6* 5.8*  --  7.1* 6.1*  --  5.6* 6.2*   CBC Recent Labs  Lab 08/19/23 0413 08/20/23 0527 08/21/23 0603 08/22/23 0324  WBC 8.7 5.6 5.7 4.8  NEUTROABS 7.1 4.2 4.2 4.0  HGB 9.9* 8.9* 9.6* 10.3*  HCT 31.3* 28.6* 29.8* 31.8*  MCV 102.3* 102.9* 103.5* 101.9*  PLT 183 194 211 194    Medications:     acyclovir   200 mg Per Tube BID   arformoterol   15 mcg Nebulization BID   collagenase    Topical Daily   feeding supplement (NEPRO CARB STEADY)  355 mL Per Tube TID PC & HS   feeding supplement (PROSource TF20)  60 mL Per Tube Daily   fiber  1 packet Per Tube BID   folic acid   1 mg Per Tube Daily   heparin  injection (subcutaneous)  5,000 Units Subcutaneous Q8H   heparin  sodium (porcine)  1,000 Units Intravenous Once   heparin  sodium (porcine)  3,000 Units Intravenous Once   heparin  sodium (porcine)  3,800 Units Intracatheter Once   insulin  aspart  0-15 Units Subcutaneous TID AC & HS   insulin  aspart  2 Units Subcutaneous TID AC & HS   insulin  glargine-yfgn  10 Units Subcutaneous BID  montelukast   5 mg Per Tube QHS   multivitamin  1 tablet Per Tube QHS   nicotine   7 mg Transdermal Daily   nutrition supplement (JUVEN)  1 packet Per Tube BID BM   mouth rinse  15 mL Mouth Rinse 4 times per day   PARoxetine   40 mg Per Tube Daily   pneumococcal 20-valent conjugate vaccine  0.5 mL Intramuscular Tomorrow-1000   revefenacin   175 mcg Nebulization Daily   sodium chloride  flush  10-40 mL Intracatheter Q12H   thiamine   100 mg Per Tube Daily   voriconazole   350 mg Per Tube Q12H    Clevester Dally, MD 08/22/2023, 10:18 AM

## 2023-08-22 NOTE — Plan of Care (Signed)
  Problem: Education: Goal: Knowledge of General Education information will improve Description: Including pain rating scale, medication(s)/side effects and non-pharmacologic comfort measures Outcome: Progressing   Problem: Health Behavior/Discharge Planning: Goal: Ability to manage health-related needs will improve Outcome: Progressing   Problem: Clinical Measurements: Goal: Ability to maintain clinical measurements within normal limits will improve Outcome: Progressing Goal: Will remain free from infection Outcome: Progressing Goal: Diagnostic test results will improve Outcome: Progressing Goal: Respiratory complications will improve Outcome: Progressing Goal: Cardiovascular complication will be avoided Outcome: Progressing   Problem: Activity: Goal: Risk for activity intolerance will decrease Outcome: Progressing   Problem: Nutrition: Goal: Adequate nutrition will be maintained Outcome: Progressing   Problem: Coping: Goal: Level of anxiety will decrease Outcome: Progressing   Problem: Elimination: Goal: Will not experience complications related to bowel motility Outcome: Progressing Goal: Will not experience complications related to urinary retention Outcome: Progressing   Problem: Pain Managment: Goal: General experience of comfort will improve and/or be controlled Outcome: Progressing   Problem: Safety: Goal: Ability to remain free from injury will improve Outcome: Progressing   Problem: Skin Integrity: Goal: Risk for impaired skin integrity will decrease Outcome: Progressing   Problem: Education: Goal: Ability to describe self-care measures that may prevent or decrease complications (Diabetes Survival Skills Education) will improve Outcome: Progressing Goal: Individualized Educational Video(s) Outcome: Progressing   Problem: Coping: Goal: Ability to adjust to condition or change in health will improve Outcome: Progressing   Problem: Fluid  Volume: Goal: Ability to maintain a balanced intake and output will improve Outcome: Progressing   Problem: Health Behavior/Discharge Planning: Goal: Ability to identify and utilize available resources and services will improve Outcome: Progressing Goal: Ability to manage health-related needs will improve Outcome: Progressing   Problem: Metabolic: Goal: Ability to maintain appropriate glucose levels will improve Outcome: Progressing   Problem: Nutritional: Goal: Maintenance of adequate nutrition will improve Outcome: Progressing Goal: Progress toward achieving an optimal weight will improve Outcome: Progressing   Problem: Skin Integrity: Goal: Risk for impaired skin integrity will decrease Outcome: Progressing   Problem: Tissue Perfusion: Goal: Adequacy of tissue perfusion will improve Outcome: Progressing   Problem: Education: Goal: Knowledge about tracheostomy care/management will improve Outcome: Progressing   Problem: Activity: Goal: Ability to tolerate increased activity will improve Outcome: Progressing   Problem: Health Behavior/Discharge Planning: Goal: Ability to manage tracheostomy will improve Outcome: Progressing   Problem: Respiratory: Goal: Patent airway maintenance will improve Outcome: Progressing   Problem: Role Relationship: Goal: Ability to communicate will improve Outcome: Progressing   Problem: Education: Goal: Knowledge of disease and its progression will improve Outcome: Progressing Goal: Individualized Educational Video(s) Outcome: Progressing   Problem: Fluid Volume: Goal: Compliance with measures to maintain balanced fluid volume will improve Outcome: Progressing   Problem: Health Behavior/Discharge Planning: Goal: Ability to manage health-related needs will improve Outcome: Progressing   Problem: Nutritional: Goal: Ability to make healthy dietary choices will improve Outcome: Progressing   Problem: Clinical  Measurements: Goal: Complications related to the disease process, condition or treatment will be avoided or minimized Outcome: Progressing

## 2023-08-22 NOTE — Progress Notes (Signed)
 He got his chemotherapy yesterday.  I really do appreciate everybody's help in doing this.  I know it is incredibly challenging to have chemotherapy  over in [facility] hospital.  Hopefully, this will help him.  Will have to just watch his labs now.  Otherwise, he is about the same.  He is somewhat alert.  He does recognize me.  He does nod his head to simple questions.  He has had no bleeding.  He has had no obvious nausea or vomiting.  He has a G-tube in.  His labs show sodium 130.  Potassium 4.4.  BUN 143.  Creatinine 3.8.  Calcium  8.1.  White cell count is 4.8.  Hemoglobin 10.3.  Platelet count 194,000.  I think he gets his dialysis today.  We will just have to watch his blood counts.  I know when he has lost chemotherapy protocol, he did have issues with some leukopenia.  Hopefully, we will see that this is working by the fact that he will not have any temperatures.  His temperature is 99.1.  Pulse 102.  Blood pressure 121/94.  His lungs sound pretty clear.  He has decent air movement bilaterally.  He has no adenopathy in the neck.  Lungs are clear bilaterally.  Cardiac exam regular rate and rhythm.  Abdomen soft.  He does have a G-tube in.  He has decent bowel sounds.  Extremities shows some chronic mild edema in the legs.  We started the new protocol.  I think his next dose in a week.  We will have to check his myeloma numbers probably in about 3 or 4 weeks.  Again I really, really appreciate everyone's help in trying to treat him.  I know it is incredibly challenging.  Once again, the staff do a tremendous job.   [name], MD   Melodie Spry 1:9

## 2023-08-23 ENCOUNTER — Inpatient Hospital Stay (HOSPITAL_COMMUNITY)

## 2023-08-23 DIAGNOSIS — J9601 Acute respiratory failure with hypoxia: Secondary | ICD-10-CM | POA: Diagnosis not present

## 2023-08-23 DIAGNOSIS — Z7189 Other specified counseling: Secondary | ICD-10-CM | POA: Diagnosis not present

## 2023-08-23 DIAGNOSIS — C9000 Multiple myeloma not having achieved remission: Secondary | ICD-10-CM | POA: Diagnosis not present

## 2023-08-23 DIAGNOSIS — N179 Acute kidney failure, unspecified: Secondary | ICD-10-CM | POA: Diagnosis not present

## 2023-08-23 LAB — CBC WITH DIFFERENTIAL/PLATELET
Abs Immature Granulocytes: 0.02 10*3/uL (ref 0.00–0.07)
Basophils Absolute: 0 10*3/uL (ref 0.0–0.1)
Basophils Relative: 0 %
Eosinophils Absolute: 0 10*3/uL (ref 0.0–0.5)
Eosinophils Relative: 0 %
HCT: 31.8 % — ABNORMAL LOW (ref 39.0–52.0)
Hemoglobin: 9.9 g/dL — ABNORMAL LOW (ref 13.0–17.0)
Immature Granulocytes: 1 %
Lymphocytes Relative: 18 %
Lymphs Abs: 0.6 10*3/uL — ABNORMAL LOW (ref 0.7–4.0)
MCH: 32.7 pg (ref 26.0–34.0)
MCHC: 31.1 g/dL (ref 30.0–36.0)
MCV: 105 fL — ABNORMAL HIGH (ref 80.0–100.0)
Monocytes Absolute: 0.5 10*3/uL (ref 0.1–1.0)
Monocytes Relative: 14 %
Neutro Abs: 2.2 10*3/uL (ref 1.7–7.7)
Neutrophils Relative %: 67 %
Platelets: 173 10*3/uL (ref 150–400)
RBC: 3.03 MIL/uL — ABNORMAL LOW (ref 4.22–5.81)
RDW: 23 % — ABNORMAL HIGH (ref 11.5–15.5)
WBC: 3.3 10*3/uL — ABNORMAL LOW (ref 4.0–10.5)
nRBC: 0 % (ref 0.0–0.2)

## 2023-08-23 LAB — GLUCOSE, CAPILLARY
Glucose-Capillary: 110 mg/dL — ABNORMAL HIGH (ref 70–99)
Glucose-Capillary: 111 mg/dL — ABNORMAL HIGH (ref 70–99)
Glucose-Capillary: 115 mg/dL — ABNORMAL HIGH (ref 70–99)
Glucose-Capillary: 121 mg/dL — ABNORMAL HIGH (ref 70–99)
Glucose-Capillary: 121 mg/dL — ABNORMAL HIGH (ref 70–99)
Glucose-Capillary: 181 mg/dL — ABNORMAL HIGH (ref 70–99)
Glucose-Capillary: 264 mg/dL — ABNORMAL HIGH (ref 70–99)

## 2023-08-23 LAB — RENAL FUNCTION PANEL
Albumin: 2.7 g/dL — ABNORMAL LOW (ref 3.5–5.0)
Anion gap: 13 (ref 5–15)
BUN: 90 mg/dL — ABNORMAL HIGH (ref 6–20)
CO2: 24 mmol/L (ref 22–32)
Calcium: 7.7 mg/dL — ABNORMAL LOW (ref 8.9–10.3)
Chloride: 94 mmol/L — ABNORMAL LOW (ref 98–111)
Creatinine, Ser: 2.75 mg/dL — ABNORMAL HIGH (ref 0.61–1.24)
GFR, Estimated: 26 mL/min — ABNORMAL LOW (ref >60)
Glucose, Bld: 100 mg/dL — ABNORMAL HIGH (ref 70–99)
Phosphorus: 4.4 mg/dL (ref 2.5–4.6)
Potassium: 4.3 mmol/L (ref 3.5–5.1)
Sodium: 131 mmol/L — ABNORMAL LOW (ref 135–145)

## 2023-08-23 LAB — URINALYSIS, W/ REFLEX TO CULTURE (INFECTION SUSPECTED)
Bilirubin Urine: NEGATIVE
Glucose, UA: NEGATIVE mg/dL
Hgb urine dipstick: NEGATIVE
Ketones, ur: NEGATIVE mg/dL
Nitrite: NEGATIVE
Protein, ur: 30 mg/dL — AB
Specific Gravity, Urine: 1.017 (ref 1.005–1.030)
pH: 5 (ref 5.0–8.0)

## 2023-08-23 LAB — MAGNESIUM: Magnesium: 2.7 mg/dL — ABNORMAL HIGH (ref 1.7–2.4)

## 2023-08-23 NOTE — Progress Notes (Signed)
 Patient ID: Frank [name]., male   DOB: 1966/02/20, 58 y.o.   MRN: [id number] [facility] Progress Note   Assessment/ Plan:   1. Acute kidney Injury on CKD3a: secondary to myeloma kidney. Transitioned to IHD on MWF schedule after previously being on CRRT; dialysis done yesterday was limited by hypotension and the plan is for his next dialysis again on Monday 6/9.  He does not have any evidence of renal recovery and is not suitable for outpatient dialysis placement yet (inability to sit in recliner and limited facilities able to care for tracheostomy). 2. Acute metabolic encephalopathy: appears to wax and wane. Awake/alert at dialysis today.  3. Multiple myeloma: Closely followed by Dr. Maria Shiner and started on aggressive regimen with Sarclisa , Kyprolis  and Cytoxan .  4. 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 On tracheal collar s/p acute hypoxic respiratory failure.  5. Anemia: from acute illness and multiple myeloma. On ESA per oncology supervision.   Subjective:   Without acute events overnight after completing dialysis.  Pancytopenia noted on labs this morning.   Objective:   BP 104/84   Pulse (!) 110   Temp (!) 100.9 F (38.3 C) (Oral)   Resp 18   Ht 6' 0.01" (1.829 m)   Wt 119 kg   SpO2 97%   BMI 35.57 kg/m   Intake/Output Summary (Last 24 hours) at 08/23/2023 0837 Last data filed at 08/22/2023 1627 Gross per 24 hour  Intake 405 ml  Output 1.4 ml  Net 403.6 ml   Weight change:   Physical Exam: Gen: Resting comfortably in bed, awakens to conversation WUJ:WJXBJ regular tachycardia, normal S1 and S2 Resp: On tracheal collar with transmitted breath sounds audible.  No rales/rhonchi YNW:GNFA, flat, non-tender Ext:No LE edema. Legs in protective booties  Imaging: No results found.  Labs: BMET Recent Labs  Lab 08/17/23 0351 08/18/23 0530 08/18/23 0531 08/19/23 0413 08/20/23 0527 08/21/23 0603 08/22/23 0324  08/23/23 0353  NA 131* 132* 131* 131* 132* 130* 130* 131*  K 4.8 5.2* 5.2* 4.6 4.6 4.4 4.4 4.3  CL 93* 93* 92* 94* 93* 92* 94* 94*  CO2 24 23 23 25 25 23  20* 24  GLUCOSE 127* 121* 121* 117* 115* 131* 131* 100*  BUN 95* 149* 150* 95* 155* 96* 143* 90*  CREATININE 2.63* 3.59* 3.51* 2.60* 3.53* 2.91* 3.80* 2.75*  CALCIUM  8.0* 8.2* 8.2* 8.1* 8.5* 8.4* 8.1* 7.7*  PHOS 5.8*  --  7.1* 6.1*  --  5.6* 6.2* 4.4   CBC Recent Labs  Lab 08/20/23 0527 08/21/23 0603 08/22/23 0324 08/23/23 0353  WBC 5.6 5.7 4.8 3.3*  NEUTROABS 4.2 4.2 4.0 2.2  HGB 8.9* 9.6* 10.3* 9.9*  HCT 28.6* 29.8* 31.8* 31.8*  MCV 102.9* 103.5* 101.9* 105.0*  PLT 194 211 194 173    Medications:     acyclovir   200 mg Per Tube BID   arformoterol   15 mcg Nebulization BID   collagenase    Topical Daily   feeding supplement (NEPRO CARB STEADY)  355 mL Per Tube TID PC & HS   feeding supplement (PROSource TF20)  60 mL Per Tube Daily   fiber  1 packet Per Tube BID   folic acid   1 mg Per Tube Daily   heparin  injection (subcutaneous)  5,000 Units Subcutaneous Q8H   insulin  aspart  0-15 Units Subcutaneous TID AC & HS   insulin  aspart  2 Units Subcutaneous TID AC & HS   insulin  glargine-yfgn  10 Units  Subcutaneous BID   montelukast   5 mg Per Tube QHS   multivitamin  1 tablet Per Tube QHS   nicotine   7 mg Transdermal Daily   nutrition supplement (JUVEN)  1 packet Per Tube BID BM   mouth rinse  15 mL Mouth Rinse 4 times per day   PARoxetine   40 mg Per Tube Daily   pneumococcal 20-valent conjugate vaccine  0.5 mL Intramuscular Tomorrow-1000   revefenacin   175 mcg Nebulization Daily   sodium chloride  flush  10-40 mL Intracatheter Q12H   thiamine   100 mg Per Tube Daily   voriconazole   350 mg Per Tube Q12H    Clevester Dally, MD 08/23/2023, 8:37 AM

## 2023-08-23 NOTE — Plan of Care (Signed)
  Problem: Education: Goal: Knowledge of General Education information will improve Description: Including pain rating scale, medication(s)/side effects and non-pharmacologic comfort measures Outcome: Progressing   Problem: Health Behavior/Discharge Planning: Goal: Ability to manage health-related needs will improve Outcome: Progressing   Problem: Clinical Measurements: Goal: Ability to maintain clinical measurements within normal limits will improve Outcome: Progressing Goal: Will remain free from infection Outcome: Progressing Goal: Diagnostic test results will improve Outcome: Progressing Goal: Respiratory complications will improve Outcome: Progressing Goal: Cardiovascular complication will be avoided Outcome: Progressing   Problem: Activity: Goal: Risk for activity intolerance will decrease Outcome: Progressing   Problem: Nutrition: Goal: Adequate nutrition will be maintained Outcome: Progressing   Problem: Coping: Goal: Level of anxiety will decrease Outcome: Progressing   Problem: Elimination: Goal: Will not experience complications related to bowel motility Outcome: Progressing Goal: Will not experience complications related to urinary retention Outcome: Progressing   Problem: Pain Managment: Goal: General experience of comfort will improve and/or be controlled Outcome: Progressing   Problem: Safety: Goal: Ability to remain free from injury will improve Outcome: Progressing   Problem: Skin Integrity: Goal: Risk for impaired skin integrity will decrease Outcome: Progressing   Problem: Education: Goal: Ability to describe self-care measures that may prevent or decrease complications (Diabetes Survival Skills Education) will improve Outcome: Progressing Goal: Individualized Educational Video(s) Outcome: Progressing   Problem: Coping: Goal: Ability to adjust to condition or change in health will improve Outcome: Progressing   Problem: Fluid  Volume: Goal: Ability to maintain a balanced intake and output will improve Outcome: Progressing   Problem: Health Behavior/Discharge Planning: Goal: Ability to identify and utilize available resources and services will improve Outcome: Progressing Goal: Ability to manage health-related needs will improve Outcome: Progressing   Problem: Metabolic: Goal: Ability to maintain appropriate glucose levels will improve Outcome: Progressing   Problem: Nutritional: Goal: Maintenance of adequate nutrition will improve Outcome: Progressing Goal: Progress toward achieving an optimal weight will improve Outcome: Progressing   Problem: Skin Integrity: Goal: Risk for impaired skin integrity will decrease Outcome: Progressing   Problem: Tissue Perfusion: Goal: Adequacy of tissue perfusion will improve Outcome: Progressing   Problem: Education: Goal: Knowledge about tracheostomy care/management will improve Outcome: Progressing   Problem: Activity: Goal: Ability to tolerate increased activity will improve Outcome: Progressing   Problem: Health Behavior/Discharge Planning: Goal: Ability to manage tracheostomy will improve Outcome: Progressing   Problem: Respiratory: Goal: Patent airway maintenance will improve Outcome: Progressing   Problem: Role Relationship: Goal: Ability to communicate will improve Outcome: Progressing   Problem: Education: Goal: Knowledge of disease and its progression will improve Outcome: Progressing Goal: Individualized Educational Video(s) Outcome: Progressing   Problem: Fluid Volume: Goal: Compliance with measures to maintain balanced fluid volume will improve Outcome: Progressing   Problem: Health Behavior/Discharge Planning: Goal: Ability to manage health-related needs will improve Outcome: Progressing   Problem: Nutritional: Goal: Ability to make healthy dietary choices will improve Outcome: Progressing   Problem: Clinical  Measurements: Goal: Complications related to the disease process, condition or treatment will be avoided or minimized Outcome: Progressing

## 2023-08-23 NOTE — Progress Notes (Signed)
 Overall, I think Frank [name] seems to be doing okay.  He is getting his tube feeds.  He does seem to be a little bit more alert.  I think he tolerated the chemotherapy pretty well.  His labs this morning show a white count of 3.3.  Hemoglobin 9.9.  Platelet count 173,000.  His ANC is 2.2.  There are no electrolytes back yet.  He did have a temperature yesterday of 101.2.  There have been no cultures for couple weeks.  He may not be evaluated in check some cultures on him.  There has been no bleeding.  We will have to watch his blood counts.  He certainly may need to have G-CSF.  His vital signs show temperature 98.7.  Pulse 118.  Blood pressure 112/79.  His lungs sound pretty clear.  He has good air movement bilaterally.  Cardiac exam is tachycardic but regular.  He has no murmurs.  Abdomen is soft.  G-tube is intact.  He has no fluid wave.  There is no palpable liver or spleen tip.  Extremity shows a brace on the left arm.  Lower extremities shows maybe some slight edema.  Neurological exam shows no focal deficits.  Again he seems to be alert.  He cannot talk because of the trach.   He had chemotherapy 2 days ago.  Again, we will have to watch his blood counts.  I think that he has temperature.  Again this may be from the underlying myeloma.  I would not be a bad idea to check cultures.  I know that this is incredibly complicated.  He has been hospitalized almost 2 months.  Think he is having his dialysis.  He may have had the temperature during dialysis yesterday.  [name], MD  1 Cor 16:13

## 2023-08-23 NOTE — Progress Notes (Signed)
 Physical Therapy Treatment Patient Details Name: Frank [name]. MRN: [id number] DOB: Dec 24, 1965 Today's Date: 08/23/2023   History of Present Illness 58 y.o. male transferred from [facility] 07/03/23 to [facility] for management of newly diagnosed plasma cell myeloma with weakness and AMS. Pt also with AKI and metabolic encephalopathy. 5/10 transfer to [facility] for iHD. 4/22 Intubated and chemo initiated. 4/23-5/10 CRRT. 5/6 IVIG started. 5/8 trach. 5/12 iHD initiated. 5/18 inability to oxygenate through trach, intubated and trach revision. 5/29 PEG placed PMHx:Lt THA, carpal tunnel syndrome, Lt TKA, Lt humerus fx, PAF, HTN, HLD, bipolar disorder, obesity, T2DM    PT Comments  Focused session on trying to progress pt's independence with bed mobility and on improving his core control/sitting balance and his lower extremity muscular activation. He was able to assist in moving his legs off the EOB, but is still requiring total assist (not initiating enough to be considered maxA yet) for all bed mobility. When sitting statically, he has difficulty finding a place to put his R hand to assist him with his balance due to R UE weakness. He did best when he had his R hand on his lap and was cued to extend his elbow to push himself backwards as he tends to flex at his trunk and lean far anteriorly when sitting EOB. His R hand would slip off his lap intermittently though. With cuing to activate his posterior cervical and trunk musculature and use his R UE, he was able to progress from needing maxA to modA briefly for static sitting balance. He performed x3 LAQs bil before fatiguing and requesting (through gestures) to return to supine. Will continue to follow acutely.     If plan is discharge home, recommend the following: Two people to help with walking and/or transfers;Assistance with cooking/housework;Assist for transportation;Two people to help with bathing/dressing/bathroom;Direct supervision/assist for medications  management;Direct supervision/assist for financial management;Help with stairs or ramp for entrance;Assistance with feeding   Can travel by private vehicle     No  Equipment Recommendations  Hospital bed;Hoyer lift;Wheelchair (measurements PT);Wheelchair cushion (measurements PT);BSC/3in1 (air mattress; roho cushion; drop-arm BSC; slide board?)    Recommendations for Other Services       Precautions / Restrictions Precautions Precautions: Fall Recall of Precautions/Restrictions: Impaired Precaution/Restrictions Comments: trach 28% 6L, flexiseal Restrictions Weight Bearing Restrictions Per Provider Order: Yes LUE Weight Bearing Per Provider Order: Non weight bearing (per Dr. Gladstone Lamer 08/23/23) Other Position/Activity Restrictions: Lt humerus non-union, splinted     Mobility  Bed Mobility Overal bed mobility: Needs Assistance Bed Mobility: Supine to Sit, Sit to Supine     Supine to sit: Total assist, HOB elevated Sit to supine: Total assist, HOB elevated   General bed mobility comments: Verbal and tactile cues provided to move each leg off R EOB with pt assisting by flexing each leg to slide them laterally to the R. He did need assistance to direct each leg and complete transitioning them off R EOB though. Pt dependent on therapist to adjust his shoulders to pivot his body. Cues provided for pt to hold onto therapist with his R UE to pull up to sit, but pt unable to grip therapist and thereby required total assist to sit up and scoot to R EOB. Cues provided for pt to lean laterally to R elbow to descend trunk and lifts legs with return to supine, but pt needed total assist to complete.    Transfers  General transfer comment: deferred to focus on strengthening and sitting balance at EOB this date    Ambulation/Gait               General Gait Details: Deferred at this time   Stairs             Wheelchair Mobility     Tilt Bed     Modified Rankin (Stroke Patients Only)       Balance Overall balance assessment: Needs assistance Sitting-balance support: Single extremity supported, No upper extremity supported, Feet supported Sitting balance-Leahy Scale: Poor Sitting balance - Comments: Pt tends to lean anteriorly, flexing at his trunk. Pt is having difficulty extending his neck and trunk enough to lean back. Verbal and tactile cues provided to neck and back musculature to extend to sit more upright. Moments of success noted, with pt only needing modA at times but otherwise needing up to maxA for static sitting balance. Attempted various R hand placement on bed and bed rail, but no success. Best success was with R hand on his lap and pt was cued to extend his R elbow to push himself back, but his R hand would slip off his lap intermittently. Sat EOB >5 min. Postural control:  (anterior lean)     Standing balance comment: deferred                            Communication Communication Communication: Impaired Factors Affecting Communication: Trach/intubated  Cognition Arousal: Alert Behavior During Therapy: Flat affect   PT - Cognitive impairments: Difficult to assess Difficult to assess due to: Impaired communication, Tracheostomy                     PT - Cognition Comments: Pt attempted to mouth/state something intermittently, but unable to determine what it was, even with PMV donned. Slow to process and initiate following simple commands. Able to gesture needs with R UE or head movements. Following commands: Impaired Following commands impaired: Follows one step commands inconsistently, Follows one step commands with increased time    Cueing Cueing Techniques: Tactile cues, Visual cues, Verbal cues, Gestural cues  Exercises General Exercises - Lower Extremity Long Arc Quad: AROM, Both, Other reps (comment), Seated (x3 bil)    General Comments General comments (skin integrity, edema,  etc.): VSS on trach collar 6L 21% FiO2      Pertinent Vitals/Pain Pain Assessment Pain Assessment: Faces Faces Pain Scale: Hurts little more Pain Location: generalized with mobility Pain Descriptors / Indicators: Grimacing Pain Intervention(s): Limited activity within patient's tolerance, Monitored during session, Repositioned    Home Living                          Prior Function            PT Goals (current goals can now be found in the care plan section) Acute Rehab PT Goals Patient Stated Goal: unable to state PT Goal Formulation: Patient unable to participate in goal setting Time For Goal Achievement: 09/06/23 Potential to Achieve Goals: Fair Progress towards PT goals: Progressing toward goals    Frequency    Min 1X/week      PT Plan      Co-evaluation              AM-PAC PT "6 Clicks" Mobility   Outcome Measure  Help needed turning from your back to your side while  in a flat bed without using bedrails?: Total Help needed moving from lying on your back to sitting on the side of a flat bed without using bedrails?: Total Help needed moving to and from a bed to a chair (including a wheelchair)?: Total Help needed standing up from a chair using your arms (e.g., wheelchair or bedside chair)?: Total Help needed to walk in hospital room?: Total Help needed climbing 3-5 steps with a railing? : Total 6 Click Score: 6    End of Session Equipment Utilized During Treatment: Oxygen;Other (comment) (L UE splint) Activity Tolerance: Patient limited by fatigue Patient left: in bed;with call bell/phone within reach;with bed alarm set;with SCD's reapplied;Other (comment) (with prevalon boots donned) Nurse Communication: Other (comment) (bowel leakage - NT) PT Visit Diagnosis: Other abnormalities of gait and mobility (R26.89);Muscle weakness (generalized) (M62.81);Difficulty in walking, not elsewhere classified (R26.2);Unsteadiness on feet (R26.81)      Time: 1610-9604 PT Time Calculation (min) (ACUTE ONLY): 27 min  Charges:    $Therapeutic Activity: 8-22 mins $Neuromuscular Re-education: 8-22 mins PT General Charges $$ ACUTE PT VISIT: 1 Visit                     Vernida Goodie, PT, DPT Acute Rehabilitation Services  Office: [phone]    Ellyn Hack 08/23/2023, 5:14 PM

## 2023-08-23 NOTE — Progress Notes (Signed)
 Progress Note    Frank [name].   VHQ:[id number]  DOB: [birth year]  DOA: 07/03/2023     50 PCP: Serita Danes, MD  Initial CC: Weakness  Hospital Course: Mr. Danzy is a 58 year old male with PMH of OSA on CPAP, pathological fracture and multiple ORIF left humerus likely due to multiple myeloma, CKD-3A, A-fib not on AC, asthma, HTN, HLD and tobacco use disorder who had been feeling weak for about 1 week, admitted to [facility] with concern for sepsis, community-acquired pneumonia and acute encephalopathy.  Patient was found to have lytic lesions, he was transferred to [facility] for oncology treatment.  In the meantime, he developed encephalopathy, acute renal failure. He remained in the ICU for 36 days.  Underwent tracheostomy. Deemed to be not stable for PEG tube placement.  He has been on tube feeds via cortrack.  Also started on dialysis after CRRT.   Significant events 4/17-admitted to medical floor with oncology consultation to treat multiple myeloma. 4/19-transferred to ICU intubated, encephalopathic. 4/20-A-fib with RVR.  Started heparin . Groin and biopsy site hematoma requiring multiple transfusions EEG 4/25 >> moderate to severe encephalopathy without any seizures or epileptiform discharges 4/29 Received Velcade  and Cytoxan  chemotherapy-intent palliative? 5/2 LP and ETT exchange  5/8 Trach placed. Some post op bleeding improved with thrombi-pad 5/10 voriconazole  added back based on CT Chest findings 5/13 more alert and able to follow some commands. 5/23, transferred to floor. 5/29-PEG placed 5/31 chemotherapy changed to Sarclisa /Kyprolis /Cytoxan .  Assessment and plan:  Recurrent fever/SIRS - Continues to spike fever with some tachycardia and tachypnea.  Normotensive - again with fever overnight; suspected MM induced but getting blood cultures on 6/7 to watch and also CXR, UA (per oncology); the CXR looks improved; UA pending - if still persistent  fever, ID rec's vanc/cefepime  Aspergillosis with pneumonia -On voriconazole  per ID.  ID to decide duration -Also on acyclovir   Multiple myeloma: Initially started on Velcade  and Cytoxan .  Chemotherapy changed to Sarclisa /Kyprolis /Cytoxan  on 5/31 - Oncology following  Goal of care - Poor long-term prognosis  - Palliative from prior providers had discussion with patient's wife who is an EMT - Remains full code with full scope of care - need to revisit goals when he has further deterioration  Acute hypoxic respiratory failure -Pulmonology following intermittently; recommended keeping trach collar.  On #6 cuffed trach -Continue chest physiotherapy  Right thigh hematoma - per CT on 08/01/23: "12.1 x 30.2 x 7.6 cm subcutaneous hematoma within the anterior right thigh. 8.4 x 10.4 x 22.2 cm subcutaneous hematoma within the right gluteal region - continue supportive care - trend CBC   Severe acute metabolic encephalopathy with hypoactive delirium Seizure disorder - Normal MRI brain.  LP negative for meningitis or encephalitis.  EEG negative for seizure but encephalopathy.   - Home Depakote  discontinued in ICU but resumed later.  Markedly elevated BUN -Reorientation and delirium precaution -Discontinued Klonopin  on 5/30 -Depacon  resumed on 5/25 -Hemodialysis for uremia   NIDDM-2 with hyperglycemia:  - A1c 6.0% (was 6.7% in 01/2023) -Continue Semglee  and SSI -Further adjustment as appropriate.   AKI on CKD-3A/uremia/hyponatremia/hyperkalemia - Due to MM, hypercalcemia and ACE inhibitors?  -S/p CRRT while in ICU.   -TDC on on 5/22.  Now on HD MWF -Nephrology managing.  Currently on HD   Paroxysmal A-fib:  - Now in sinus rhythm. Rate controlled without meds. Unable to anticoagulate due to hematoma and intolerance.   Obstructive sleep apnea: Now with trach collar  Anxiety/bipolar disorder - Continue home Paxil  - Continue Depakote    Severe malnutrition Body mass index is 27.95  kg/m. Nutrition Problem: Severe Malnutrition Etiology: acute illness (prolonged illness, interuptions in feeding) Signs/Symptoms: moderate fat depletion, moderate muscle depletion, percent weight loss (9.6% x 1 month) Percent weight loss: 9.6 % Interventions: Prostat, Tube feeding, MVI   Pressure skin injury:     Pressure Injury 07/05/23 Heel Left;Right Unstageable - Full thickness tissue loss in which the base of the injury is covered by slough (yellow, tan, gray, green or brown) and/or eschar (tan, brown or black) in the wound bed. (Active)  07/05/23 1108  Location: Heel  Location Orientation: Left;Right  Staging: Unstageable - Full thickness tissue loss in which the base of the injury is covered by slough (yellow, tan, gray, green or brown) and/or eschar (tan, brown or black) in the wound bed.  Wound Description (Comments):   Present on Admission: Yes  Dressing Type Foam - Lift dressing to assess site every shift 08/19/23 1000     Pressure Injury 07/11/23 Buttocks Mid Unstageable - Full thickness tissue loss in which the base of the injury is covered by slough (yellow, tan, gray, green or brown) and/or eschar (tan, brown or black) in the wound bed. 8 cm x 6 cmx 0.1 cm (Active)  07/11/23 1600  Location: Buttocks  Location Orientation: Mid  Staging: Unstageable - Full thickness tissue loss in which the base of the injury is covered by slough (yellow, tan, gray, green or brown) and/or eschar (tan, brown or black) in the wound bed.  Wound Description (Comments): 8 cm x 6 cmx 0.1 cm  Present on Admission: No  Dressing Type Foam - Lift dressing to assess site every shift 08/19/23 1000     Pressure Injury 07/27/23 Buttocks Right;Lower (Active)  07/27/23 1700  Location: Buttocks  Location Orientation: Right;Lower  Staging:   Wound Description (Comments):   Present on Admission: Yes  Dressing Type Foam - Lift dressing to assess site every shift 08/19/23 1000    Interval History:  No  events overnight.  Fever overnight. Oncology has ordered repeat blood cultures, CXR, Urinalysis. Resting comfortably in bed in his usual state this morning.   Old records reviewed in assessment of this patient  Antimicrobials: Voriconazole  08/05/2023 >> current  DVT prophylaxis:  heparin  injection 5,000 Units Start: 08/14/23 2200 Place and maintain sequential compression device Start: 07/14/23 1033   Code Status:   Code Status: Full Code  Mobility Assessment (Last 72 Hours)     Mobility Assessment     Row Name 08/22/23 2020 08/22/23 0730 08/21/23 2121 08/21/23 0800 08/21/23 0400   Does patient have an order for bedrest or is patient medically unstable No - Continue assessment No - Continue assessment No - Continue assessment No - Continue assessment No - Continue assessment   What is the highest level of mobility based on the progressive mobility assessment? Level 1 (Bedfast) - Unable to balance while sitting on edge of bed Level 1 (Bedfast) - Unable to balance while sitting on edge of bed Level 1 (Bedfast) - Unable to balance while sitting on edge of bed Level 1 (Bedfast) - Unable to balance while sitting on edge of bed Level 1 (Bedfast) - Unable to balance while sitting on edge of bed   Is the above level different from baseline mobility prior to current illness? Yes - Recommend PT order -- Yes - Recommend PT order Yes - Recommend PT order Yes - Recommend PT order  Row Name 08/21/23 0000 08/20/23 2000         Does patient have an order for bedrest or is patient medically unstable No - Continue assessment No - Continue assessment      What is the highest level of mobility based on the progressive mobility assessment? Level 1 (Bedfast) - Unable to balance while sitting on edge of bed Level 1 (Bedfast) - Unable to balance while sitting on edge of bed      Is the above level different from baseline mobility prior to current illness? Yes - Recommend PT order Yes - Recommend PT order                Barriers to discharge: ? Disposition Plan:  TBD Status is: Inpt  Objective: Blood pressure 106/85, pulse 100, temperature 99 F (37.2 C), temperature source Oral, resp. rate 14, height 6' 0.01" (1.829 m), weight 119 kg, SpO2 100%.  Examination:  Physical Exam Constitutional:      Comments: Chronically ill-appearing adult man lying in bed appearing uncomfortable but no distress  HENT:     Head: Normocephalic and atraumatic.     Mouth/Throat:     Mouth: Mucous membranes are dry.  Eyes:     Extraocular Movements: Extraocular movements intact.  Cardiovascular:     Rate and Rhythm: Normal rate and regular rhythm.  Pulmonary:     Effort: Pulmonary effort is normal. No respiratory distress.     Comments: Coarse breath sounds bilaterally Chest:     Comments: Right chest wall PermCath in place Abdominal:     General: Bowel sounds are normal. There is no distension.     Palpations: Abdomen is soft.     Tenderness: There is no abdominal tenderness.     Comments: Large right thigh hematoma appreciated  Genitourinary:    Comments: Rectal tube in place Musculoskeletal:     Cervical back: Normal range of motion and neck supple.  Skin:    General: Skin is warm and dry.  Neurological:     Comments: Follows some commands.  Moves all 4 extremities      Consultants:  Nephrology ID Palliative care  Oncology  Procedures:    Data Reviewed: Results for orders placed or performed during the hospital encounter of 07/03/23 (from the past 24 hours)  Glucose, capillary     Status: Abnormal   Collection Time: 08/22/23  3:18 PM  Result Value Ref Range   Glucose-Capillary 201 (H) 70 - 99 mg/dL  Glucose, capillary     Status: Abnormal   Collection Time: 08/22/23  7:59 PM  Result Value Ref Range   Glucose-Capillary 210 (H) 70 - 99 mg/dL   Comment 1 Notify RN   Glucose, capillary     Status: Abnormal   Collection Time: 08/22/23 11:48 PM  Result Value Ref Range    Glucose-Capillary 182 (H) 70 - 99 mg/dL   Comment 1 Notify RN   Glucose, capillary     Status: Abnormal   Collection Time: 08/23/23  3:32 AM  Result Value Ref Range   Glucose-Capillary 111 (H) 70 - 99 mg/dL   Comment 1 Notify RN   CBC with Differential/Platelet     Status: Abnormal   Collection Time: 08/23/23  3:53 AM  Result Value Ref Range   WBC 3.3 (L) 4.0 - 10.5 K/uL   RBC 3.03 (L) 4.22 - 5.81 MIL/uL   Hemoglobin 9.9 (L) 13.0 - 17.0 g/dL   HCT 82.9 (L) 56.2 - 13.0 %  MCV 105.0 (H) 80.0 - 100.0 fL   MCH 32.7 26.0 - 34.0 pg   MCHC 31.1 30.0 - 36.0 g/dL   RDW 57.8 (H) 46.9 - 62.9 %   Platelets 173 150 - 400 K/uL   nRBC 0.0 0.0 - 0.2 %   Neutrophils Relative % 67 %   Neutro Abs 2.2 1.7 - 7.7 K/uL   Lymphocytes Relative 18 %   Lymphs Abs 0.6 (L) 0.7 - 4.0 K/uL   Monocytes Relative 14 %   Monocytes Absolute 0.5 0.1 - 1.0 K/uL   Eosinophils Relative 0 %   Eosinophils Absolute 0.0 0.0 - 0.5 K/uL   Basophils Relative 0 %   Basophils Absolute 0.0 0.0 - 0.1 K/uL   Immature Granulocytes 1 %   Abs Immature Granulocytes 0.02 0.00 - 0.07 K/uL  Magnesium      Status: Abnormal   Collection Time: 08/23/23  3:53 AM  Result Value Ref Range   Magnesium  2.7 (H) 1.7 - 2.4 mg/dL  Renal function panel     Status: Abnormal   Collection Time: 08/23/23  3:53 AM  Result Value Ref Range   Sodium 131 (L) 135 - 145 mmol/L   Potassium 4.3 3.5 - 5.1 mmol/L   Chloride 94 (L) 98 - 111 mmol/L   CO2 24 22 - 32 mmol/L   Glucose, Bld 100 (H) 70 - 99 mg/dL   BUN 90 (H) 6 - 20 mg/dL   Creatinine, Ser 5.28 (H) 0.61 - 1.24 mg/dL   Calcium  7.7 (L) 8.9 - 10.3 mg/dL   Phosphorus 4.4 2.5 - 4.6 mg/dL   Albumin  2.7 (L) 3.5 - 5.0 g/dL   GFR, Estimated 26 (L) >60 mL/min   Anion gap 13 5 - 15  Glucose, capillary     Status: Abnormal   Collection Time: 08/23/23  7:49 AM  Result Value Ref Range   Glucose-Capillary 110 (H) 70 - 99 mg/dL  Glucose, capillary     Status: Abnormal   Collection Time: 08/23/23 11:46  AM  Result Value Ref Range   Glucose-Capillary 121 (H) 70 - 99 mg/dL    I have reviewed pertinent nursing notes, vitals, labs, and images as necessary. I have ordered labwork to follow up on as indicated.  I have reviewed the last notes from staff over past 24 hours. I have discussed patient's care plan and test results with nursing staff, CM/SW, and other staff as appropriate.  Time spent: Greater than 50% of the 55 minute visit was spent in counseling/coordination of care for the patient as laid out in the A&P.  Late entry. Seen 08/22/23 after HD   LOS: 51 days   Faith Homes, MD [facility] 08/23/2023, 2:57 PM

## 2023-08-23 NOTE — Progress Notes (Signed)
 ID PROGRESS NOTE:   58yo M with muliple myeloma, c/b aspergillus pneumonia s/p trach, ESRD on HD, had chemo dosed on 6/5 now starting to have fevers, though is not neutropenic  Agree with getting blood cx first. He is clinically stable. Fevers could be due to myeloma   If recurrent fever, would start empiric vancomycin  and cefepime.  Gerold Kos Levern Reader MD MPH [facility] [phone]

## 2023-08-23 NOTE — Progress Notes (Signed)
 Progress Note    Frank [name].   ZOX:[id number]  DOB: Feb 15, 1966  DOA: 07/03/2023     50 PCP: Serita Danes, MD  Initial CC: Weakness  Hospital Course: [name] is a 58 year old male with PMH of OSA on CPAP, pathological fracture and multiple ORIF left humerus likely due to multiple myeloma, CKD-3A, A-fib not on AC, asthma, HTN, HLD and tobacco use disorder who had been feeling weak for about 1 week, admitted to [facility] with concern for sepsis, community-acquired pneumonia and acute encephalopathy.  Patient was found to have lytic lesions, he was transferred to [facility] for oncology treatment.  In the meantime, he developed encephalopathy, acute renal failure. He remained in the ICU for 36 days.  Underwent tracheostomy. Deemed to be not stable for PEG tube placement.  He has been on tube feeds via cortrack.  Also started on dialysis after CRRT.   Significant events 4/17-admitted to medical floor with oncology consultation to treat multiple myeloma. 4/19-transferred to ICU intubated, encephalopathic. 4/20-A-fib with RVR.  Started heparin . Groin and biopsy site hematoma requiring multiple transfusions EEG 4/25 >> moderate to severe encephalopathy without any seizures or epileptiform discharges 4/29 Received Velcade  and Cytoxan  chemotherapy-intent palliative? 5/2 LP and ETT exchange  5/8 Trach placed. Some post op bleeding improved with thrombi-pad 5/10 voriconazole  added back based on CT Chest findings 5/13 more alert and able to follow some commands. 5/23, transferred to floor. 5/29-PEG placed 5/31 chemotherapy changed to Sarclisa /Kyprolis /Cytoxan .  Assessment and plan:  Recurrent fever/SIRS - Continues to spike fever with some tachycardia and tachypnea.  Normotensive - again with fever overnight; suspected MM induced but getting blood cultures on 6/7 to watch and also CXR, UA (per oncology); the CXR looks improved; UA pending - if still persistent  fever, ID rec's vanc/cefepime  Aspergillosis with pneumonia -On voriconazole  per ID.  ID to decide duration -Also on acyclovir   Multiple myeloma: Initially started on Velcade  and Cytoxan .  Chemotherapy changed to Sarclisa /Kyprolis /Cytoxan  on 5/31 - Oncology following  Goal of care - Poor long-term prognosis  - Palliative from prior providers had discussion with patient's wife who is an EMT - Remains full code with full scope of care - need to revisit goals when he has further deterioration  Acute hypoxic respiratory failure -Pulmonology following intermittently; recommended keeping trach collar.  On #6 cuffed trach -Continue chest physiotherapy  Right thigh hematoma - per CT on 08/01/23: "12.1 x 30.2 x 7.6 cm subcutaneous hematoma within the anterior right thigh. 8.4 x 10.4 x 22.2 cm subcutaneous hematoma within the right gluteal region - continue supportive care - trend CBC   Severe acute metabolic encephalopathy with hypoactive delirium Seizure disorder - Normal MRI brain.  LP negative for meningitis or encephalitis.  EEG negative for seizure but encephalopathy.   - Home Depakote  discontinued in ICU but resumed later.  Markedly elevated BUN -Reorientation and delirium precaution -Discontinued Klonopin  on 5/30 -Depacon  resumed on 5/25 -Hemodialysis for uremia   NIDDM-2 with hyperglycemia:  - A1c 6.0% (was 6.7% in 01/2023) -Continue Semglee  and SSI -Further adjustment as appropriate.   AKI on CKD-3A/uremia/hyponatremia/hyperkalemia - Due to MM, hypercalcemia and ACE inhibitors?  -S/p CRRT while in ICU.   -TDC on on 5/22.  Now on HD MWF -Nephrology managing.  Currently on HD   Paroxysmal A-fib:  - Now in sinus rhythm. Rate controlled without meds. Unable to anticoagulate due to hematoma and intolerance.   Obstructive sleep apnea: Now with trach collar  Anxiety/bipolar disorder - Continue home Paxil  - Continue Depakote    Severe malnutrition Body mass index is 27.95  kg/m. Nutrition Problem: Severe Malnutrition Etiology: acute illness (prolonged illness, interuptions in feeding) Signs/Symptoms: moderate fat depletion, moderate muscle depletion, percent weight loss (9.6% x 1 month) Percent weight loss: 9.6 % Interventions: Prostat, Tube feeding, MVI   Pressure skin injury:     Pressure Injury 07/05/23 Heel Left;Right Unstageable - Full thickness tissue loss in which the base of the injury is covered by slough (yellow, tan, gray, green or brown) and/or eschar (tan, brown or black) in the wound bed. (Active)  07/05/23 1108  Location: Heel  Location Orientation: Left;Right  Staging: Unstageable - Full thickness tissue loss in which the base of the injury is covered by slough (yellow, tan, gray, green or brown) and/or eschar (tan, brown or black) in the wound bed.  Wound Description (Comments):   Present on Admission: Yes  Dressing Type Foam - Lift dressing to assess site every shift 08/19/23 1000     Pressure Injury 07/11/23 Buttocks Mid Unstageable - Full thickness tissue loss in which the base of the injury is covered by slough (yellow, tan, gray, green or brown) and/or eschar (tan, brown or black) in the wound bed. 8 cm x 6 cmx 0.1 cm (Active)  07/11/23 1600  Location: Buttocks  Location Orientation: Mid  Staging: Unstageable - Full thickness tissue loss in which the base of the injury is covered by slough (yellow, tan, gray, green or brown) and/or eschar (tan, brown or black) in the wound bed.  Wound Description (Comments): 8 cm x 6 cmx 0.1 cm  Present on Admission: No  Dressing Type Foam - Lift dressing to assess site every shift 08/19/23 1000     Pressure Injury 07/27/23 Buttocks Right;Lower (Active)  07/27/23 1700  Location: Buttocks  Location Orientation: Right;Lower  Staging:   Wound Description (Comments):   Present on Admission: Yes  Dressing Type Foam - Lift dressing to assess site every shift 08/19/23 1000    Interval History:  No  events overnight.  Seen after returning from HD   Old records reviewed in assessment of this patient  Antimicrobials: Voriconazole  08/05/2023 >> current  DVT prophylaxis:  heparin  injection 5,000 Units Start: 08/14/23 2200 Place and maintain sequential compression device Start: 07/14/23 1033   Code Status:   Code Status: Full Code  Mobility Assessment (Last 72 Hours)     Mobility Assessment     Row Name 08/22/23 2020 08/22/23 0730 08/21/23 2121 08/21/23 0800 08/21/23 0400   Does patient have an order for bedrest or is patient medically unstable No - Continue assessment No - Continue assessment No - Continue assessment No - Continue assessment No - Continue assessment   What is the highest level of mobility based on the progressive mobility assessment? Level 1 (Bedfast) - Unable to balance while sitting on edge of bed Level 1 (Bedfast) - Unable to balance while sitting on edge of bed Level 1 (Bedfast) - Unable to balance while sitting on edge of bed Level 1 (Bedfast) - Unable to balance while sitting on edge of bed Level 1 (Bedfast) - Unable to balance while sitting on edge of bed   Is the above level different from baseline mobility prior to current illness? Yes - Recommend PT order -- Yes - Recommend PT order Yes - Recommend PT order Yes - Recommend PT order    Row Name 08/21/23 0000 08/20/23 2000  Does patient have an order for bedrest or is patient medically unstable No - Continue assessment No - Continue assessment      What is the highest level of mobility based on the progressive mobility assessment? Level 1 (Bedfast) - Unable to balance while sitting on edge of bed Level 1 (Bedfast) - Unable to balance while sitting on edge of bed      Is the above level different from baseline mobility prior to current illness? Yes - Recommend PT order Yes - Recommend PT order               Barriers to discharge: ? Disposition Plan:  TBD Status is: Inpt  Objective: Blood  pressure 106/85, pulse 100, temperature 99 F (37.2 C), temperature source Oral, resp. rate 14, height 6' 0.01" (1.829 m), weight 119 kg, SpO2 100%.  Examination:  Physical Exam Constitutional:      Comments: Chronically ill-appearing adult man lying in bed appearing uncomfortable but no distress  HENT:     Head: Normocephalic and atraumatic.     Mouth/Throat:     Mouth: Mucous membranes are dry.  Eyes:     Extraocular Movements: Extraocular movements intact.  Cardiovascular:     Rate and Rhythm: Normal rate and regular rhythm.  Pulmonary:     Effort: Pulmonary effort is normal. No respiratory distress.     Comments: Coarse breath sounds bilaterally Chest:     Comments: Right chest wall PermCath in place Abdominal:     General: Bowel sounds are normal. There is no distension.     Palpations: Abdomen is soft.     Tenderness: There is no abdominal tenderness.     Comments: Large right thigh hematoma appreciated  Genitourinary:    Comments: Rectal tube in place Musculoskeletal:     Cervical back: Normal range of motion and neck supple.  Skin:    General: Skin is warm and dry.  Neurological:     Comments: Follows some commands.  Moves all 4 extremities      Consultants:  Nephrology ID Palliative care  Oncology  Procedures:    Data Reviewed: Results for orders placed or performed during the hospital encounter of 07/03/23 (from the past 24 hours)  Glucose, capillary     Status: Abnormal   Collection Time: 08/22/23  3:18 PM  Result Value Ref Range   Glucose-Capillary 201 (H) 70 - 99 mg/dL  Glucose, capillary     Status: Abnormal   Collection Time: 08/22/23  7:59 PM  Result Value Ref Range   Glucose-Capillary 210 (H) 70 - 99 mg/dL   Comment 1 Notify RN   Glucose, capillary     Status: Abnormal   Collection Time: 08/22/23 11:48 PM  Result Value Ref Range   Glucose-Capillary 182 (H) 70 - 99 mg/dL   Comment 1 Notify RN   Glucose, capillary     Status: Abnormal    Collection Time: 08/23/23  3:32 AM  Result Value Ref Range   Glucose-Capillary 111 (H) 70 - 99 mg/dL   Comment 1 Notify RN   CBC with Differential/Platelet     Status: Abnormal   Collection Time: 08/23/23  3:53 AM  Result Value Ref Range   WBC 3.3 (L) 4.0 - 10.5 K/uL   RBC 3.03 (L) 4.22 - 5.81 MIL/uL   Hemoglobin 9.9 (L) 13.0 - 17.0 g/dL   HCT 95.6 (L) 21.3 - 08.6 %   MCV 105.0 (H) 80.0 - 100.0 fL   MCH 32.7 26.0 -  34.0 pg   MCHC 31.1 30.0 - 36.0 g/dL   RDW 40.3 (H) 47.4 - 25.9 %   Platelets 173 150 - 400 K/uL   nRBC 0.0 0.0 - 0.2 %   Neutrophils Relative % 67 %   Neutro Abs 2.2 1.7 - 7.7 K/uL   Lymphocytes Relative 18 %   Lymphs Abs 0.6 (L) 0.7 - 4.0 K/uL   Monocytes Relative 14 %   Monocytes Absolute 0.5 0.1 - 1.0 K/uL   Eosinophils Relative 0 %   Eosinophils Absolute 0.0 0.0 - 0.5 K/uL   Basophils Relative 0 %   Basophils Absolute 0.0 0.0 - 0.1 K/uL   Immature Granulocytes 1 %   Abs Immature Granulocytes 0.02 0.00 - 0.07 K/uL  Magnesium      Status: Abnormal   Collection Time: 08/23/23  3:53 AM  Result Value Ref Range   Magnesium  2.7 (H) 1.7 - 2.4 mg/dL  Renal function panel     Status: Abnormal   Collection Time: 08/23/23  3:53 AM  Result Value Ref Range   Sodium 131 (L) 135 - 145 mmol/L   Potassium 4.3 3.5 - 5.1 mmol/L   Chloride 94 (L) 98 - 111 mmol/L   CO2 24 22 - 32 mmol/L   Glucose, Bld 100 (H) 70 - 99 mg/dL   BUN 90 (H) 6 - 20 mg/dL   Creatinine, Ser 5.63 (H) 0.61 - 1.24 mg/dL   Calcium  7.7 (L) 8.9 - 10.3 mg/dL   Phosphorus 4.4 2.5 - 4.6 mg/dL   Albumin  2.7 (L) 3.5 - 5.0 g/dL   GFR, Estimated 26 (L) >60 mL/min   Anion gap 13 5 - 15  Glucose, capillary     Status: Abnormal   Collection Time: 08/23/23  7:49 AM  Result Value Ref Range   Glucose-Capillary 110 (H) 70 - 99 mg/dL  Glucose, capillary     Status: Abnormal   Collection Time: 08/23/23 11:46 AM  Result Value Ref Range   Glucose-Capillary 121 (H) 70 - 99 mg/dL    I have reviewed pertinent nursing  notes, vitals, labs, and images as necessary. I have ordered labwork to follow up on as indicated.  I have reviewed the last notes from staff over past 24 hours. I have discussed patient's care plan and test results with nursing staff, CM/SW, and other staff as appropriate.  Time spent: Greater than 50% of the 55 minute visit was spent in counseling/coordination of care for the patient as laid out in the A&P.  Late entry. Seen 08/22/23 after HD   LOS: 51 days   Faith Homes, MD [facility] 08/23/2023, 2:56 PM

## 2023-08-24 DIAGNOSIS — J9601 Acute respiratory failure with hypoxia: Secondary | ICD-10-CM | POA: Diagnosis not present

## 2023-08-24 DIAGNOSIS — C9000 Multiple myeloma not having achieved remission: Secondary | ICD-10-CM | POA: Diagnosis not present

## 2023-08-24 DIAGNOSIS — Z7189 Other specified counseling: Secondary | ICD-10-CM | POA: Diagnosis not present

## 2023-08-24 DIAGNOSIS — N179 Acute kidney failure, unspecified: Secondary | ICD-10-CM | POA: Diagnosis not present

## 2023-08-24 LAB — BLOOD CULTURE ID PANEL (REFLEXED) - BCID2

## 2023-08-24 LAB — CBC WITH DIFFERENTIAL/PLATELET
Abs Immature Granulocytes: 0 10*3/uL (ref 0.00–0.07)
Basophils Absolute: 0 10*3/uL (ref 0.0–0.1)
Basophils Relative: 0 %
Eosinophils Absolute: 0 10*3/uL (ref 0.0–0.5)
Eosinophils Relative: 0 %
HCT: 32 % — ABNORMAL LOW (ref 39.0–52.0)
Hemoglobin: 10.3 g/dL — ABNORMAL LOW (ref 13.0–17.0)
Lymphocytes Relative: 16 %
Lymphs Abs: 0.6 10*3/uL — ABNORMAL LOW (ref 0.7–4.0)
MCH: 33.3 pg (ref 26.0–34.0)
MCHC: 32.2 g/dL (ref 30.0–36.0)
MCV: 103.6 fL — ABNORMAL HIGH (ref 80.0–100.0)
Monocytes Absolute: 0.3 10*3/uL (ref 0.1–1.0)
Monocytes Relative: 8 %
Neutro Abs: 2.7 10*3/uL (ref 1.7–7.7)
Neutrophils Relative %: 76 %
Platelets: 154 10*3/uL (ref 150–400)
RBC: 3.09 MIL/uL — ABNORMAL LOW (ref 4.22–5.81)
RDW: 22.7 % — ABNORMAL HIGH (ref 11.5–15.5)
WBC: 3.6 10*3/uL — ABNORMAL LOW (ref 4.0–10.5)
nRBC: 0 % (ref 0.0–0.2)
nRBC: 0 /100{WBCs}

## 2023-08-24 LAB — GLUCOSE, CAPILLARY
Glucose-Capillary: 103 mg/dL — ABNORMAL HIGH (ref 70–99)
Glucose-Capillary: 85 mg/dL (ref 70–99)
Glucose-Capillary: 143 mg/dL — ABNORMAL HIGH (ref 70–99)
Glucose-Capillary: 133 mg/dL — ABNORMAL HIGH (ref 70–99)
Glucose-Capillary: 135 mg/dL — ABNORMAL HIGH (ref 70–99)
Glucose-Capillary: 132 mg/dL — ABNORMAL HIGH (ref 70–99)

## 2023-08-24 LAB — URINE CULTURE: Culture: NO GROWTH

## 2023-08-24 LAB — RENAL FUNCTION PANEL
Albumin: 2.6 g/dL — ABNORMAL LOW (ref 3.5–5.0)
Anion gap: 16 — ABNORMAL HIGH (ref 5–15)
BUN: 147 mg/dL — ABNORMAL HIGH (ref 6–20)
CO2: 22 mmol/L (ref 22–32)
Calcium: 8 mg/dL — ABNORMAL LOW (ref 8.9–10.3)
Chloride: 93 mmol/L — ABNORMAL LOW (ref 98–111)
Creatinine, Ser: 3.57 mg/dL — ABNORMAL HIGH (ref 0.61–1.24)
GFR, Estimated: 19 mL/min — ABNORMAL LOW (ref >60)
Glucose, Bld: 86 mg/dL (ref 70–99)
Phosphorus: 5.7 mg/dL — ABNORMAL HIGH (ref 2.5–4.6)
Potassium: 4.4 mmol/L (ref 3.5–5.1)
Sodium: 131 mmol/L — ABNORMAL LOW (ref 135–145)

## 2023-08-24 LAB — MAGNESIUM: Magnesium: 3 mg/dL — ABNORMAL HIGH (ref 1.7–2.4)

## 2023-08-24 MED ORDER — VANCOMYCIN HCL IN DEXTROSE 1-5 GM/200ML-% IV SOLN
1000.0000 mg | INTRAVENOUS | Status: AC
  Administered 2023-08-25 – 2023-08-29 (×3): 1000 mg via INTRAVENOUS
  Filled 2023-08-24 (×4): qty 200

## 2023-08-24 MED ORDER — MIDODRINE HCL 5 MG PO TABS
10.0000 mg | ORAL_TABLET | ORAL | Status: DC
  Administered 2023-08-25 – 2023-09-03 (×2): 10 mg
  Filled 2023-08-24 (×3): qty 2

## 2023-08-24 MED ORDER — VANCOMYCIN HCL 2000 MG/400ML IV SOLN
2000.0000 mg | Freq: Once | INTRAVENOUS | Status: AC
  Administered 2023-08-25: 2000 mg via INTRAVENOUS
  Filled 2023-08-24 (×2): qty 400

## 2023-08-24 NOTE — Progress Notes (Addendum)
 PHARMACY - PHYSICIAN COMMUNICATION CRITICAL VALUE ALERT - BLOOD CULTURE IDENTIFICATION (BCID)  Frank Kyal Arts. is an 58 y.o. male who presented to [facility] on 07/03/2023 with a chief complaint of AMS and hypercalcemia of malignancy.  Assessment:  BCx growing Staph epi, mec A+ in 1 of 3 bottles.  Intermittent fever.  Name of physician (or Provider) Contacted: Drs. Girguis and [place]  Current antibiotics: Acyclovir  and voriconazole  for aspergillus PNA  Changes to prescribed antibiotics recommended:  Recommendations declined by provider due to clinical deterioration Start vancomycin   Results for orders placed or performed during the hospital encounter of 07/03/23  Blood Culture ID Panel (Reflexed) (Collected: 08/23/2023  9:35 AM)  Result Value Ref Range   Enterococcus faecalis NOT DETECTED NOT DETECTED   Enterococcus Faecium NOT DETECTED NOT DETECTED   Listeria monocytogenes NOT DETECTED NOT DETECTED   Staphylococcus species DETECTED (A) NOT DETECTED   Staphylococcus aureus (BCID) NOT DETECTED NOT DETECTED   Staphylococcus epidermidis DETECTED (A) NOT DETECTED   Staphylococcus lugdunensis NOT DETECTED NOT DETECTED   Streptococcus species NOT DETECTED NOT DETECTED   Streptococcus agalactiae NOT DETECTED NOT DETECTED   Streptococcus pneumoniae NOT DETECTED NOT DETECTED   Streptococcus pyogenes NOT DETECTED NOT DETECTED   A.calcoaceticus-baumannii NOT DETECTED NOT DETECTED   Bacteroides fragilis NOT DETECTED NOT DETECTED   Enterobacterales NOT DETECTED NOT DETECTED   Enterobacter cloacae complex NOT DETECTED NOT DETECTED   Escherichia coli NOT DETECTED NOT DETECTED   Klebsiella aerogenes NOT DETECTED NOT DETECTED   Klebsiella oxytoca NOT DETECTED NOT DETECTED   Klebsiella pneumoniae NOT DETECTED NOT DETECTED   Proteus species NOT DETECTED NOT DETECTED   Salmonella species NOT DETECTED NOT DETECTED   Serratia marcescens NOT DETECTED NOT DETECTED   Haemophilus influenzae NOT  DETECTED NOT DETECTED   Neisseria meningitidis NOT DETECTED NOT DETECTED   Pseudomonas aeruginosa NOT DETECTED NOT DETECTED   Stenotrophomonas maltophilia NOT DETECTED NOT DETECTED   Candida albicans NOT DETECTED NOT DETECTED   Candida auris NOT DETECTED NOT DETECTED   Candida glabrata NOT DETECTED NOT DETECTED   Candida krusei NOT DETECTED NOT DETECTED   Candida parapsilosis NOT DETECTED NOT DETECTED   Candida tropicalis NOT DETECTED NOT DETECTED   Cryptococcus neoformans/gattii NOT DETECTED NOT DETECTED   Methicillin resistance mecA/C DETECTED (A) NOT DETECTED    Frank [name] D. Marikay Show, PharmD, BCPS, BCCCP 08/24/2023, 2:06 PM

## 2023-08-24 NOTE — Progress Notes (Signed)
 Patient ID: Frank [name]., male   DOB: 1965-07-03, 58 y.o.   MRN: [id number] [facility] Progress Note   Assessment/ Plan:   1. Acute kidney Injury on CKD3a: secondary to myeloma kidney. Transitioned to IHD on MWF schedule after previously being on CRRT; dialysis has been a little challenging in the face of hypotension and I will order for next hemodialysis again tomorrow (with predialysis administration of midodrine ).  He does not have any evidence of renal recovery and is not suitable for outpatient dialysis placement yet (inability to sit in recliner and limited facilities able to care for tracheostomy). 2. Acute metabolic encephalopathy: appears to wax and wane-remains awake/alert when seen today and able to mouth words and nod to questions. 3. Multiple myeloma: Closely followed by Dr. Maria Shiner and started on aggressive regimen with Sarclisa , Kyprolis  and Cytoxan .  4. 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 On tracheal collar s/p acute hypoxic respiratory failure.  5. Anemia: from acute illness and multiple myeloma. On ESA per oncology supervision.   Subjective:   No acute clinical events noted from overnight.   Objective:   BP 106/88 (BP Location: Right Arm)   Pulse (!) 105   Temp 97.8 F (36.6 C) (Oral)   Resp 12   Ht 6' 0.01" (1.829 m)   Wt 119 kg   SpO2 97%   BMI 35.57 kg/m   Intake/Output Summary (Last 24 hours) at 08/24/2023 0842 Last data filed at 08/23/2023 1700 Gross per 24 hour  Intake --  Output 300 ml  Net -300 ml   Weight change:   Physical Exam: Gen: Resting comfortably in bed, awakens to conversation WUJ:[id number] regular tachycardia, normal S1 and S2 Resp: On tracheal collar with transmitted breath sounds audible.  No rales/rhonchi YNW:GNFA, flat, non-tender Ext:No LE edema. Legs in protective booties  Imaging: DG CHEST PORT 1 VIEW Result Date: 08/23/2023 CLINICAL DATA:  Fever. EXAM: PORTABLE CHEST 1 VIEW  COMPARISON:  Chest radiograph dated 08/03/2023. FINDINGS: Tracheostomy with tip approximately 2 cm above the carina. Right IJ catheter with tip over the right atrium. There is mild cardiomegaly with mild central vascular congestion. No focal consolidation, pleural effusion or pneumothorax. No acute osseous pathology. IMPRESSION: Mild cardiomegaly with mild central vascular congestion. No focal consolidation. Electronically Signed   By: Angus Bark M.D.   On: 08/23/2023 09:35    Labs: BMET Recent Labs  Lab 08/18/23 0530 08/18/23 0531 08/19/23 0413 08/20/23 0527 08/21/23 0603 08/22/23 0324 08/23/23 0353  NA 132* 131* 131* 132* 130* 130* 131*  K 5.2* 5.2* 4.6 4.6 4.4 4.4 4.3  CL 93* 92* 94* 93* 92* 94* 94*  CO2 23 23 25 25 23  20* 24  GLUCOSE 121* 121* 117* 115* 131* 131* 100*  BUN 149* 150* 95* 155* 96* 143* 90*  CREATININE 3.59* 3.51* 2.60* 3.53* 2.91* 3.80* 2.75*  CALCIUM  8.2* 8.2* 8.1* 8.5* 8.4* 8.1* 7.7*  PHOS  --  7.1* 6.1*  --  5.6* 6.2* 4.4   CBC Recent Labs  Lab 08/21/23 0603 08/22/23 0324 08/23/23 0353 08/24/23 0744  WBC 5.7 4.8 3.3* 3.6*  NEUTROABS 4.2 4.0 2.2 PENDING  HGB 9.6* 10.3* 9.9* 10.3*  HCT 29.8* 31.8* 31.8* 32.0*  MCV 103.5* 101.9* 105.0* 103.6*  PLT 211 194 173 154    Medications:     acyclovir   200 mg Per Tube BID   arformoterol   15 mcg Nebulization BID   collagenase    Topical Daily   feeding supplement (  NEPRO CARB STEADY)  355 mL Per Tube TID PC & HS   feeding supplement (PROSource TF20)  60 mL Per Tube Daily   fiber  1 packet Per Tube BID   folic acid   1 mg Per Tube Daily   heparin  injection (subcutaneous)  5,000 Units Subcutaneous Q8H   insulin  aspart  0-15 Units Subcutaneous TID AC & HS   insulin  aspart  2 Units Subcutaneous TID AC & HS   insulin  glargine-yfgn  10 Units Subcutaneous BID   montelukast   5 mg Per Tube QHS   multivitamin  1 tablet Per Tube QHS   nicotine   7 mg Transdermal Daily   nutrition supplement (JUVEN)  1 packet Per  Tube BID BM   mouth rinse  15 mL Mouth Rinse 4 times per day   PARoxetine   40 mg Per Tube Daily   pneumococcal 20-valent conjugate vaccine  0.5 mL Intramuscular Tomorrow-1000   revefenacin   175 mcg Nebulization Daily   sodium chloride  flush  10-40 mL Intracatheter Q12H   thiamine   100 mg Per Tube Daily   voriconazole   350 mg Per Tube Q12H    Clevester Dally, MD 08/24/2023, 8:42 AM

## 2023-08-24 NOTE — Progress Notes (Addendum)
 Progress Note    Frank [name].   VHQ:[id number]  DOB: 06-29-1965  DOA: 07/03/2023     51 PCP: Serita Danes, MD  Initial CC: Weakness  Hospital Course: [name] is a 58 year old male with PMH of OSA on CPAP, pathological fracture and multiple ORIF left humerus likely due to multiple myeloma, CKD-3A, A-fib not on AC, asthma, HTN, HLD and tobacco use disorder who had been feeling weak for about 1 week, admitted to [facility] with concern for sepsis, community-acquired pneumonia and acute encephalopathy.  Patient was found to have lytic lesions, he was transferred to [facility] for oncology treatment.  In the meantime, he developed encephalopathy, acute renal failure. He remained in the ICU for 36 days.  Underwent tracheostomy. Deemed to be not stable for PEG tube placement.  He has been on tube feeds via cortrack.  Also started on dialysis after CRRT.   Significant events 4/17-admitted to medical floor with oncology consultation to treat multiple myeloma. 4/19-transferred to ICU intubated, encephalopathic. 4/20-A-fib with RVR.  Started heparin . Groin and biopsy site hematoma requiring multiple transfusions EEG 4/25 >> moderate to severe encephalopathy without any seizures or epileptiform discharges 4/29 Received Velcade  and Cytoxan  chemotherapy-intent palliative? 5/2 LP and ETT exchange  5/8 Trach placed. Some post op bleeding improved with thrombi-pad 5/10 voriconazole  added back based on CT Chest findings 5/13 more alert and able to follow some commands. 5/23, transferred to floor. 5/29-PEG placed 5/31 chemotherapy changed to Sarclisa /Kyprolis /Cytoxan .  Assessment and plan:  Recurrent fever/SIRS - Intermittent fevers being monitored - suspected MM induced but getting blood cultures on 6/7 to watch and also CXR, UA (per oncology); the CXR looks improved; UA difficult to interpret as likely colonized too - blood cultures drawn 6/7 (1/3 bottles with MRSE so  far); discussed with ID, will start on vanc for now and watch cultures longer   Aspergillosis with pneumonia -On voriconazole  per ID.  ID to decide duration -Also on acyclovir   Multiple myeloma: Initially started on Velcade  and Cytoxan .  Chemotherapy changed to Sarclisa /Kyprolis /Cytoxan  on 5/31 - Oncology following  Goal of care - Poor long-term prognosis  - Palliative from prior providers had discussion with patient's wife - Remains full code with full scope of care - need to revisit goals if/when he has further deterioration  Acute hypoxic respiratory failure -Pulmonology following intermittently; recommended keeping trach collar.  On #6 cuffed trach -Continue chest physiotherapy  Right thigh hematoma - per CT on 08/01/23: "12.1 x 30.2 x 7.6 cm subcutaneous hematoma within the anterior right thigh. 8.4 x 10.4 x 22.2 cm subcutaneous hematoma within the right gluteal region - continue supportive care - trend CBC   Severe acute metabolic encephalopathy with hypoactive delirium Seizure disorder - Normal MRI brain.  LP negative for meningitis or encephalitis.  EEG negative for seizure but encephalopathy.   - Home Depakote  discontinued in ICU but resumed later.  Markedly elevated BUN -Reorientation and delirium precaution -Discontinued Klonopin  on 5/30 -Depacon  resumed on 5/25 -Hemodialysis for uremia   NIDDM-2 with hyperglycemia:  - A1c 6.0% (was 6.7% in 01/2023) -Continue Semglee  and SSI -Further adjustment as appropriate.   AKI on CKD-3A/uremia/hyponatremia/hyperkalemia - Due to MM, hypercalcemia and ACE inhibitors?  -S/p CRRT while in ICU.   -TDC on on 5/22.  Now on HD MWF -Nephrology managing.  Currently on iHD - not stable for d/c from HD standpoint until could also sit in a chair for HD also   Paroxysmal A-fib:  - Now  in sinus rhythm. Rate controlled without meds. Unable to anticoagulate due to hematoma and intolerance.   Obstructive sleep apnea: Now with trach  collar   Anxiety/bipolar disorder - Continue home Paxil  - Continue Depakote    Severe malnutrition Body mass index is 27.95 kg/m. Nutrition Problem: Severe Malnutrition Etiology: acute illness (prolonged illness, interuptions in feeding) Signs/Symptoms: moderate fat depletion, moderate muscle depletion, percent weight loss (9.6% x 1 month) Percent weight loss: 9.6 % Interventions: Prostat, Tube feeding, MVI   Pressure skin injury:     Pressure Injury 07/05/23 Heel Left;Right Unstageable - Full thickness tissue loss in which the base of the injury is covered by slough (yellow, tan, gray, green or brown) and/or eschar (tan, brown or black) in the wound bed. (Active)  07/05/23 1108  Location: Heel  Location Orientation: Left;Right  Staging: Unstageable - Full thickness tissue loss in which the base of the injury is covered by slough (yellow, tan, gray, green or brown) and/or eschar (tan, brown or black) in the wound bed.  Wound Description (Comments):   Present on Admission: Yes  Dressing Type Foam - Lift dressing to assess site every shift 08/19/23 1000     Pressure Injury 07/11/23 Buttocks Mid Unstageable - Full thickness tissue loss in which the base of the injury is covered by slough (yellow, tan, gray, green or brown) and/or eschar (tan, brown or black) in the wound bed. 8 cm x 6 cmx 0.1 cm (Active)  07/11/23 1600  Location: Buttocks  Location Orientation: Mid  Staging: Unstageable - Full thickness tissue loss in which the base of the injury is covered by slough (yellow, tan, gray, green or brown) and/or eschar (tan, brown or black) in the wound bed.  Wound Description (Comments): 8 cm x 6 cmx 0.1 cm  Present on Admission: No  Dressing Type Foam - Lift dressing to assess site every shift 08/19/23 1000     Pressure Injury 07/27/23 Buttocks Right;Lower (Active)  07/27/23 1700  Location: Buttocks  Location Orientation: Right;Lower  Staging:   Wound Description (Comments):    Present on Admission: Yes  Dressing Type Foam - Lift dressing to assess site every shift 08/19/23 1000    Interval History:  No events overnight.  No fever in past 24 hours but blood cultures growing 1/3 bottles with MRSE.  Being started on vancomycin  for now while they further mature.  In general, main barriers for discharge are the combo of trach, PEG, HD, chemo   Old records reviewed in assessment of this patient  Antimicrobials: Voriconazole  08/05/2023 >> current Vancomycin  08/24/2023 >> current  DVT prophylaxis:  heparin  injection 5,000 Units Start: 08/14/23 2200 Place and maintain sequential compression device Start: 07/14/23 1033   Code Status:   Code Status: Full Code  Mobility Assessment (Last 72 Hours)     Mobility Assessment     Row Name 08/24/23 0400 08/24/23 0000 08/23/23 2000 08/23/23 1700 08/23/23 0813   Does patient have an order for bedrest or is patient medically unstable Yes- Bedfast (Level 1) - Complete Yes- Bedfast (Level 1) - Complete Yes- Bedfast (Level 1) - Complete -- No - Continue assessment   What is the highest level of mobility based on the progressive mobility assessment? Level 1 (Bedfast) - Unable to balance while sitting on edge of bed Level 1 (Bedfast) - Unable to balance while sitting on edge of bed Level 1 (Bedfast) - Unable to balance while sitting on edge of bed Level 1 (Bedfast) - Unable to balance while sitting  on edge of bed Level 1 (Bedfast) - Unable to balance while sitting on edge of bed   Is the above level different from baseline mobility prior to current illness? Yes - Recommend PT order Yes - Recommend PT order Yes - Recommend PT order -- Yes - Recommend PT order    Row Name 08/22/23 2020 08/22/23 0730 08/21/23 2121       Does patient have an order for bedrest or is patient medically unstable No - Continue assessment No - Continue assessment No - Continue assessment     What is the highest level of mobility based on the progressive  mobility assessment? Level 1 (Bedfast) - Unable to balance while sitting on edge of bed Level 1 (Bedfast) - Unable to balance while sitting on edge of bed Level 1 (Bedfast) - Unable to balance while sitting on edge of bed     Is the above level different from baseline mobility prior to current illness? Yes - Recommend PT order -- Yes - Recommend PT order              Barriers to discharge: ? Disposition Plan:  TBD Status is: Inpt  Objective: Blood pressure 107/85, pulse 100, temperature 98.6 F (37 C), temperature source Oral, resp. rate 16, height 6' 0.01" (1.829 m), weight 119 kg, SpO2 97%.  Examination:  Physical Exam Constitutional:      Comments: Chronically ill-appearing adult man lying in bed appearing uncomfortable but no distress  HENT:     Head: Normocephalic and atraumatic.     Mouth/Throat:     Mouth: Mucous membranes are dry.  Eyes:     Extraocular Movements: Extraocular movements intact.  Cardiovascular:     Rate and Rhythm: Normal rate and regular rhythm.  Pulmonary:     Effort: Pulmonary effort is normal. No respiratory distress.     Comments: Coarse breath sounds bilaterally Chest:     Comments: Right chest wall PermCath in place Abdominal:     General: Bowel sounds are normal. There is no distension.     Palpations: Abdomen is soft.     Tenderness: There is no abdominal tenderness.     Comments: Large right thigh hematoma appreciated  Genitourinary:    Comments: Rectal tube in place Musculoskeletal:     Cervical back: Normal range of motion and neck supple.  Skin:    General: Skin is warm and dry.  Neurological:     Comments: Follows commands.  Moves all 4 extremities      Consultants:  Nephrology ID Palliative care  Oncology  Procedures:    Data Reviewed: Results for orders placed or performed during the hospital encounter of 07/03/23 (from the past 24 hours)  Glucose, capillary     Status: Abnormal   Collection Time: 08/23/23  3:39 PM   Result Value Ref Range   Glucose-Capillary 121 (H) 70 - 99 mg/dL  Glucose, capillary     Status: Abnormal   Collection Time: 08/23/23  7:35 PM  Result Value Ref Range   Glucose-Capillary 115 (H) 70 - 99 mg/dL   Comment 1 Notify RN    Comment 2 Document in Chart   Glucose, capillary     Status: Abnormal   Collection Time: 08/23/23 10:42 PM  Result Value Ref Range   Glucose-Capillary 181 (H) 70 - 99 mg/dL  Glucose, capillary     Status: Abnormal   Collection Time: 08/23/23 11:51 PM  Result Value Ref Range   Glucose-Capillary 264 (H) 70 -  99 mg/dL   Comment 1 Notify RN    Comment 2 Document in Chart   Glucose, capillary     Status: Abnormal   Collection Time: 08/24/23  4:48 AM  Result Value Ref Range   Glucose-Capillary 103 (H) 70 - 99 mg/dL   Comment 1 Notify RN    Comment 2 Document in Chart   Glucose, capillary     Status: None   Collection Time: 08/24/23  7:23 AM  Result Value Ref Range   Glucose-Capillary 85 70 - 99 mg/dL  CBC with Differential/Platelet     Status: Abnormal   Collection Time: 08/24/23  7:44 AM  Result Value Ref Range   WBC 3.6 (L) 4.0 - 10.5 K/uL   RBC 3.09 (L) 4.22 - 5.81 MIL/uL   Hemoglobin 10.3 (L) 13.0 - 17.0 g/dL   HCT 40.9 (L) 81.1 - 91.4 %   MCV 103.6 (H) 80.0 - 100.0 fL   MCH 33.3 26.0 - 34.0 pg   MCHC 32.2 30.0 - 36.0 g/dL   RDW 78.2 (H) 95.6 - 21.3 %   Platelets 154 150 - 400 K/uL   nRBC 0.0 0.0 - 0.2 %   Neutrophils Relative % 76 %   Neutro Abs 2.7 1.7 - 7.7 K/uL   Lymphocytes Relative 16 %   Lymphs Abs 0.6 (L) 0.7 - 4.0 K/uL   Monocytes Relative 8 %   Monocytes Absolute 0.3 0.1 - 1.0 K/uL   Eosinophils Relative 0 %   Eosinophils Absolute 0.0 0.0 - 0.5 K/uL   Basophils Relative 0 %   Basophils Absolute 0.0 0.0 - 0.1 K/uL   WBC Morphology See Note    RBC Morphology See Note    Smear Review See Note    nRBC 0 0 /100 WBC   Abs Immature Granulocytes 0.00 0.00 - 0.07 K/uL  Magnesium      Status: Abnormal   Collection Time: 08/24/23   7:44 AM  Result Value Ref Range   Magnesium  3.0 (H) 1.7 - 2.4 mg/dL  Renal function panel     Status: Abnormal   Collection Time: 08/24/23  7:44 AM  Result Value Ref Range   Sodium 131 (L) 135 - 145 mmol/L   Potassium 4.4 3.5 - 5.1 mmol/L   Chloride 93 (L) 98 - 111 mmol/L   CO2 22 22 - 32 mmol/L   Glucose, Bld 86 70 - 99 mg/dL   BUN 086 (H) 6 - 20 mg/dL   Creatinine, Ser 5.78 (H) 0.61 - 1.24 mg/dL   Calcium  8.0 (L) 8.9 - 10.3 mg/dL   Phosphorus 5.7 (H) 2.5 - 4.6 mg/dL   Albumin  2.6 (L) 3.5 - 5.0 g/dL   GFR, Estimated 19 (L) >60 mL/min   Anion gap 16 (H) 5 - 15  Glucose, capillary     Status: Abnormal   Collection Time: 08/24/23 11:41 AM  Result Value Ref Range   Glucose-Capillary 143 (H) 70 - 99 mg/dL    I have reviewed pertinent nursing notes, vitals, labs, and images as necessary. I have ordered labwork to follow up on as indicated.  I have reviewed the last notes from staff over past 24 hours. I have discussed patient's care plan and test results with nursing staff, CM/SW, and other staff as appropriate.  Time spent: Greater than 50% of the 55 minute visit was spent in counseling/coordination of care for the patient as laid out in the A&P.   LOS: 52 days   Faith Homes, MD [facility] 08/24/2023, 2:01 PM

## 2023-08-24 NOTE — Progress Notes (Signed)
 RT note. Family members asking about patients communication board.  Tried looking all over room and drawers unable to locate. Charge RN aware, RT will continue to monitor.

## 2023-08-24 NOTE — Progress Notes (Addendum)
 Pharmacy Antibiotic Note  Frank [name]. is a 58 y.o. male admitted on 07/03/2023 with generalized weakness (multifactorial secondary to hypercalcemia, suspected malignancy, multiple myeloma). 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 Pharmacy now consulted for vancomycin  dosing for intermittent fever and new BCID results showing MRSE. AKI on CKD - patient required CRRT from 4/23-5/10 >> transitioned to [facility] MWF.   WBC 3s, Tmax 6/7 100.9 > afebrile   Plan: Vancomycin  2000 mg IV once today Start Vancomycin  1000 mg IV MWF qHD Continue Voriconazole  350 mg per tube Q12H  Continue Acyclovir  200 mg per tube BID  Follow up cultures, fever curve, clinical status   Height: 6' 0.01" (182.9 cm) Weight:  (unable to weigh) IBW/kg (Calculated) : 77.62  Temp (24hrs), Avg:98.5 F (36.9 C), Min:97.8 F (36.6 C), Max:98.8 F (37.1 C)  Recent Labs  Lab 08/20/23 0527 08/21/23 0603 08/22/23 0324 08/23/23 0353 08/24/23 0744  WBC 5.6 5.7 4.8 3.3* 3.6*  CREATININE 3.53* 2.91* 3.80* 2.75* 3.57*    Estimated Creatinine Clearance: 29.7 mL/min (A) (by C-G formula based on SCr of 3.57 mg/dL (H)).    Allergies  Allergen Reactions   Morphine And Codeine Anaphylaxis   Shellfish Allergy Anaphylaxis   Betadine  [Povidone Iodine ] Itching   Bupropion Other (See Comments)    Shaking of the body, hallucinations    Chlorhexidine  Hives    Patient reports never had issues CHG with mouth rinse.   Influenza Vaccines Hives   Metoprolol  Rash    Antimicrobials this admission: 4/17 R/Z >> 4/22 Zosyn  >> 5/1 Meropenem  >> 5/6 Levaquin  >> 5/12 4/28 Vanco >> 5/5 4/18 Fluconazole  >> 4/23 5/4 Voriconazole  >> 5/6>> reinitiated 5/10 >> - 08/01/23 1.9 mcg/ml on 350 mg/12h - 08/08/23 0.4 mcg/ml on 350 mg/12h - 08/19/22 1.3 mcg/ml >> continue 350 mg/12h 5/10 Unasyn  >> 5/11  5/11 vancomycin  >> 5/16 Acyclovir  ppx 5/24 >> Vancomycin  6/8 >>  Microbiology results: 4/19: MRSA PCR: neg 4/19  UCx: ngF 4/19 BCx: ngF 4/23 TA: few Staph epi: MRSE 4/28 BCx: ngF 4/30 TA: rare aspergillus fumigatus 5/2 CSF: ngtd 5/4 TA: rare mold, aspergillus [place]  5/5 TA PJP stain: neg 5/5 MRSA PCR: neg 5/5 BCx: NGTD 5/9 tracheal asp: abundant staph haemolyticus + mold, aspergillus [place]  5/11 BCx >> ngtd 5/15 TA: sent 6/7 UCx: ng 6/7 BCx: MRSE (1 of 3 bottles)  Thank you for allowing pharmacy to be a part of this patient's care.  Chyann Ambrocio 08/24/2023 1:54 PM

## 2023-08-25 ENCOUNTER — Inpatient Hospital Stay (HOSPITAL_COMMUNITY)

## 2023-08-25 DIAGNOSIS — L89150 Pressure ulcer of sacral region, unstageable: Secondary | ICD-10-CM | POA: Diagnosis not present

## 2023-08-25 DIAGNOSIS — N179 Acute kidney failure, unspecified: Secondary | ICD-10-CM | POA: Diagnosis not present

## 2023-08-25 DIAGNOSIS — C9000 Multiple myeloma not having achieved remission: Secondary | ICD-10-CM | POA: Diagnosis not present

## 2023-08-25 DIAGNOSIS — B9562 Methicillin resistant Staphylococcus aureus infection as the cause of diseases classified elsewhere: Secondary | ICD-10-CM

## 2023-08-25 DIAGNOSIS — Z7189 Other specified counseling: Secondary | ICD-10-CM | POA: Diagnosis not present

## 2023-08-25 DIAGNOSIS — R509 Fever, unspecified: Secondary | ICD-10-CM | POA: Diagnosis not present

## 2023-08-25 DIAGNOSIS — J9601 Acute respiratory failure with hypoxia: Secondary | ICD-10-CM | POA: Diagnosis not present

## 2023-08-25 LAB — CBC WITH DIFFERENTIAL/PLATELET
Abs Immature Granulocytes: 0.02 10*3/uL (ref 0.00–0.07)
Basophils Absolute: 0 10*3/uL (ref 0.0–0.1)
Basophils Relative: 0 %
Eosinophils Absolute: 0 10*3/uL (ref 0.0–0.5)
Eosinophils Relative: 0 %
HCT: 30.2 % — ABNORMAL LOW (ref 39.0–52.0)
Hemoglobin: 9.5 g/dL — ABNORMAL LOW (ref 13.0–17.0)
Immature Granulocytes: 1 %
Lymphocytes Relative: 21 %
Lymphs Abs: 0.7 10*3/uL (ref 0.7–4.0)
MCH: 33.2 pg (ref 26.0–34.0)
MCHC: 31.5 g/dL (ref 30.0–36.0)
MCV: 105.6 fL — ABNORMAL HIGH (ref 80.0–100.0)
Monocytes Absolute: 0.6 10*3/uL (ref 0.1–1.0)
Monocytes Relative: 16 %
Neutro Abs: 2.2 10*3/uL (ref 1.7–7.7)
Neutrophils Relative %: 62 %
Platelets: 130 10*3/uL — ABNORMAL LOW (ref 150–400)
RBC: 2.86 MIL/uL — ABNORMAL LOW (ref 4.22–5.81)
RDW: 22.5 % — ABNORMAL HIGH (ref 11.5–15.5)
WBC: 3.5 10*3/uL — ABNORMAL LOW (ref 4.0–10.5)
nRBC: 0.6 % — ABNORMAL HIGH (ref 0.0–0.2)

## 2023-08-25 LAB — RENAL FUNCTION PANEL
Albumin: 2.5 g/dL — ABNORMAL LOW (ref 3.5–5.0)
Anion gap: 16 — ABNORMAL HIGH (ref 5–15)
BUN: 174 mg/dL — ABNORMAL HIGH (ref 6–20)
CO2: 21 mmol/L — ABNORMAL LOW (ref 22–32)
Calcium: 8.2 mg/dL — ABNORMAL LOW (ref 8.9–10.3)
Chloride: 93 mmol/L — ABNORMAL LOW (ref 98–111)
Creatinine, Ser: 3.58 mg/dL — ABNORMAL HIGH (ref 0.61–1.24)
GFR, Estimated: 19 mL/min — ABNORMAL LOW (ref >60)
Glucose, Bld: 234 mg/dL — ABNORMAL HIGH (ref 70–99)
Phosphorus: 5.4 mg/dL — ABNORMAL HIGH (ref 2.5–4.6)
Potassium: 4.3 mmol/L (ref 3.5–5.1)
Sodium: 130 mmol/L — ABNORMAL LOW (ref 135–145)

## 2023-08-25 LAB — CULTURE, BLOOD (ROUTINE X 2): Special Requests: ADEQUATE

## 2023-08-25 LAB — MAGNESIUM: Magnesium: 2.9 mg/dL — ABNORMAL HIGH (ref 1.7–2.4)

## 2023-08-25 LAB — GLUCOSE, CAPILLARY
Glucose-Capillary: 189 mg/dL — ABNORMAL HIGH (ref 70–99)
Glucose-Capillary: 147 mg/dL — ABNORMAL HIGH (ref 70–99)
Glucose-Capillary: 106 mg/dL — ABNORMAL HIGH (ref 70–99)
Glucose-Capillary: 197 mg/dL — ABNORMAL HIGH (ref 70–99)

## 2023-08-25 MED ORDER — HEPARIN SODIUM (PORCINE) 1000 UNIT/ML IJ SOLN
INTRAMUSCULAR | Status: AC
  Filled 2023-08-25: qty 4

## 2023-08-25 MED ORDER — HEPARIN SODIUM (PORCINE) 1000 UNIT/ML IJ SOLN
3800.0000 [IU] | Freq: Once | INTRAMUSCULAR | Status: AC
  Administered 2023-08-25: 3800 [IU]

## 2023-08-25 MED ORDER — HEPARIN SODIUM (PORCINE) 1000 UNIT/ML DIALYSIS: 40.0000 [IU]/kg | INTRAMUSCULAR | Status: DC | PRN

## 2023-08-25 NOTE — Progress Notes (Signed)
 Mr. Barrell definitely seems more alert.  He certainly seems to understand what I was saying to him.  I am happy about this.  So far, seems to tolerated treatment fairly well.  He still is on the trach.  He still gets his dialysis.  His labs today show white count of 3.5.  Hemoglobin 9.5.  Platelet count 130,000.  His electrolytes are not back yet today.  He still gets his tube feeds.  His blood pressure is still little on the lower side.  Of note, he had 1 positive blood culture for Staph epi.  I am not sure if this is a true pathogen or not.  Only 1 culture came back positive.  He had a chest x-ray also on 08/23/2023.  This shows some vascular congestion.  His vital signs are temperature 97.8.  Pulse 95.  Blood pressure 95/52.  His lungs sound relatively clear bilaterally.  Cardiac exam regular rate and rhythm.  I do not hear any murmurs.  Abdomen is soft.  Bowel sounds are present.  He has no fluid wave.  There is no palpable liver or spleen tip.  Extremity shows a brace on his left arm.  He has cushioning on his lower legs.  I really do not see any edema.  Neurological exam shows no obvious neurological deficits.  Again he seems to be more alert.  We will go ahead with his second week of treatment.  This will be on Thursday.  Again, I am not sure if this Staph epi is a true pathogen.  I know he does have an central lines.  We will have to see how his labs look this week.  I do appreciate everybody's help.   [name], MD  Monika Annas 2:10

## 2023-08-25 NOTE — Progress Notes (Signed)
 08/25/2023  Seen in f/u for trach management.  S: Seems in good spirits, stable on TC, no secretion issues  O:    08/25/2023    7:25 AM 08/25/2023    4:02 AM 08/25/2023    3:45 AM  Vitals with BMI  Systolic 116    Diastolic 83    Pulse 95 95 96   Chronically ill Moves to command Nonlabored breathing pattern Multiple pressure ulcers  A: Multiple myeloma on chemo Complex encephalopathy improving Renal failure on HD Multiple pressure ulcers  P: I think we can work toward capping trials provided he tolerates downsizing to 4 reg shiley cuffless, this may help with placement efforts Will check on tomorrow   Ardelle Kos MD Pulmonary Critical Care Medicine [employer] if during day (7a-7p) [phone] if after hours (7p-7a)

## 2023-08-25 NOTE — Progress Notes (Signed)
 Patient ID: Frank Batman., male   DOB: [birth year], 58 y.o.   MRN: [id number] [facility] Progress Note   Assessment/ Plan:   1. Acute kidney Injury on CKD3a: secondary to myeloma kidney. Transitioned to IHD on MWF schedule after previously being on CRRT; dialysis has been a little challenging in the face of hypotension.   - HD today with predialysis administration of midodrine ).   - He does not have any evidence of renal recovery and is not suitable for outpatient dialysis placement yet (inability to sit in recliner and limited facilities able to care for tracheostomy).  2. Acute metabolic encephalopathy: appears to wax and wane per charting   3. Multiple myeloma: Closely followed by Dr. Maria Shiner and started on aggressive regimen with Sarclisa , Kyprolis  and Cytoxan  per charting   4. 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   5. Acute hypoxic respiratory failure - with tracheal collar - pulm following  6. Anemia: from acute illness and multiple myeloma. On ESA per oncology    7. Bacteremia - noted staph epidermidis from 6/7 blood cultures.  Previously seen by ID and they have a note pending for today as well.   - Repeat blood cultures ordered  - abx per primary team and ID    Disposition - continue inpatient monitoring     Subjective:    He had 800 mL UOP over 6/8 charted.  Pulm has seen him today and they have a goal of working toward capping trials hopefully which may help with placement efforts.  ID is seeing him again   Review of systems:   Limited by trach but does nod and shake head to some questions Denies air hunger Denies n/v      Objective:   BP 116/83 (BP Location: Right Arm)   Pulse 95   Temp 98.3 F (36.8 C) (Oral)   Resp 14   Ht 6' 0.01" (1.829 m)   Wt 119 kg   SpO2 100%   BMI 35.57 kg/m   Intake/Output Summary (Last 24 hours) at 08/25/2023 1045 Last data filed at 08/24/2023 2000 Gross per 24 hour   Intake --  Output 1145 ml  Net -1145 ml   Weight change:   Physical Exam:   General adult male in bed in no acute distress HEENT normocephalic atraumatic extraocular movements intact sclera anicteric Neck supple trach in place Lungs clear to auscultation bilaterally normal work of breathing at rest  Heart S1S2 no rub  Abdomen soft nontender nondistended Extremities no edema  Psych normal mood and affect   Imaging: No results found.   Labs: BMET Recent Labs  Lab 08/19/23 0413 08/20/23 9562 08/21/23 0603 08/22/23 0324 08/23/23 0353 08/24/23 0744 08/25/23 0419  NA 131* 132* 130* 130* 131* 131* 130*  K 4.6 4.6 4.4 4.4 4.3 4.4 4.3  CL 94* 93* 92* 94* 94* 93* 93*  CO2 25 25 23  20* 24 22 21*  GLUCOSE 117* 115* 131* 131* 100* 86 234*  BUN 95* 155* 96* 143* 90* 147* 174*  CREATININE 2.60* 3.53* 2.91* 3.80* 2.75* 3.57* 3.58*  CALCIUM  8.1* 8.5* 8.4* 8.1* 7.7* 8.0* 8.2*  PHOS 6.1*  --  5.6* 6.2* 4.4 5.7* 5.4*   CBC Recent Labs  Lab 08/22/23 0324 08/23/23 0353 08/24/23 0744 08/25/23 0419  WBC 4.8 3.3* 3.6* 3.5*  NEUTROABS 4.0 2.2 2.7 PENDING  HGB 10.3* 9.9* 10.3* 9.5*  HCT 31.8* 31.8* 32.0* 30.2*  MCV 101.9* 105.0* 103.6* 105.6*  PLT 194 173 154 130*    Medications:     acyclovir   200 mg Per Tube BID   arformoterol   15 mcg Nebulization BID   collagenase    Topical Daily   feeding supplement (NEPRO CARB STEADY)  355 mL Per Tube TID PC & HS   feeding supplement (PROSource TF20)  60 mL Per Tube Daily   fiber  1 packet Per Tube BID   folic acid   1 mg Per Tube Daily   heparin  injection (subcutaneous)  5,000 Units Subcutaneous Q8H   insulin  aspart  0-15 Units Subcutaneous TID AC & HS   insulin  aspart  2 Units Subcutaneous TID AC & HS   insulin  glargine-yfgn  10 Units Subcutaneous BID   midodrine   10 mg Per Tube Q M,W,F-HD   montelukast   5 mg Per Tube QHS   multivitamin  1 tablet Per Tube QHS   nicotine   7 mg Transdermal Daily   nutrition supplement (JUVEN)  1  packet Per Tube BID BM   mouth rinse  15 mL Mouth Rinse 4 times per day   PARoxetine   40 mg Per Tube Daily   pneumococcal 20-valent conjugate vaccine  0.5 mL Intramuscular Tomorrow-1000   revefenacin   175 mcg Nebulization Daily   sodium chloride  flush  10-40 mL Intracatheter Q12H   thiamine   100 mg Per Tube Daily   voriconazole   350 mg Per Tube Q12H    Nan Aver, MD 08/25/2023, 11:20 AM

## 2023-08-25 NOTE — Progress Notes (Addendum)
 Patient is off the unit for MBS 1430: Patient was transported from Radiology to HD

## 2023-08-25 NOTE — Procedures (Signed)
 Tracheostomy Change Note  Patient Details:   Name: Frank [name]. DOB: Nov 15, 1965 MRN: [id number]    Airway Documentation:     Evaluation  O2 sats: stable throughout Complications: No apparent complications Patient did tolerate procedure well. Bilateral Breath Sounds: Diminished  Pt trach changed by RT x2 per CCM. Bilateral breath sounds and positive color change noted.  Milissa Fesperman V 08/25/2023, 1:10 PM

## 2023-08-25 NOTE — Progress Notes (Signed)
 Progress Note    Frank [name].   WGN:[id number]  DOB: [birth year]  DOA: 07/03/2023     52 PCP: Serita Danes, MD  Initial CC: Weakness  Hospital Course: Mr. Maffei is a 58 year old male with PMH of OSA on CPAP, pathological fracture and multiple ORIF left humerus likely due to multiple myeloma, CKD-3A, A-fib not on AC, asthma, HTN, HLD and tobacco use disorder who had been feeling weak for about 1 week, admitted to [facility] with concern for sepsis, community-acquired pneumonia and acute encephalopathy.  Patient was found to have lytic lesions, he was transferred to [facility] for oncology treatment.  In the meantime, he developed encephalopathy, acute renal failure. He remained in the ICU for 36 days.  Underwent tracheostomy. Deemed to be not stable for PEG tube placement.  He has been on tube feeds via cortrack.  Also started on dialysis after CRRT.   Significant events 4/17-admitted to medical floor with oncology consultation to treat multiple myeloma. 4/19-transferred to ICU intubated, encephalopathic. 4/20-A-fib with RVR.  Started heparin . Groin and biopsy site hematoma requiring multiple transfusions EEG 4/25 >> moderate to severe encephalopathy without any seizures or epileptiform discharges 4/29 Received Velcade  and Cytoxan  chemotherapy-intent palliative? 5/2 LP and ETT exchange  5/8 Trach placed. Some post op bleeding improved with thrombi-pad 5/10 voriconazole  added back based on CT Chest findings 5/13 more alert and able to follow some commands. 5/23, transferred to floor. 5/29-PEG placed 5/31 chemotherapy changed to Sarclisa /Kyprolis /Cytoxan .  Assessment and plan:  Recurrent fever/SIRS - Intermittent fevers being monitored - suspected MM induced but getting blood cultures on 6/7 to watch and also CXR, UA (per oncology); the CXR looks improved; UA difficult to interpret as likely colonized too - blood cultures drawn 6/7 (1/3 bottles with MRSE so  far); discussed with ID, will start on vanc for now and watch cultures longer  - Repeat blood cultures ordered 08/25/2023  Aspergillosis with pneumonia -On voriconazole  per ID.  ID to decide duration -Also on acyclovir   Multiple myeloma: Initially started on Velcade  and Cytoxan .  Chemotherapy changed to Sarclisa /Kyprolis /Cytoxan  on 5/31 - Oncology following  Goal of care - Poor long-term prognosis  - Palliative from prior providers had discussion with patient's wife - Remains full code with full scope of care - need to revisit goals if/when he has further deterioration - main plan for dispo includes finding a facility that can accept: Trach, PEG, HD, Chemo (pulm is attempting to downsize and cap trach further which may help some with placement but other barriers still significant)  Acute hypoxic respiratory failure -Pulmonology following intermittently; recommended keeping trach collar.  On #6 cuffed trach -Continue chest physiotherapy as needed  Right thigh hematoma - per CT on 08/01/23: "12.1 x 30.2 x 7.6 cm subcutaneous hematoma within the anterior right thigh. 8.4 x 10.4 x 22.2 cm subcutaneous hematoma within the right gluteal region - continue supportive care - trend CBC   Severe acute metabolic encephalopathy with hypoactive delirium Seizure disorder - Normal MRI brain.  LP negative for meningitis or encephalitis.  EEG negative for seizure but encephalopathy.   - Home Depakote  discontinued in ICU but resumed later.  Markedly elevated BUN -Reorientation and delirium precaution -Discontinued Klonopin  on 5/30 -Depacon  resumed on 5/25 -Hemodialysis for uremia   NIDDM-2 with hyperglycemia:  - A1c 6.0% (was 6.7% in 01/2023) -Continue Semglee  and SSI -Further adjustment as appropriate.   AKI on CKD-3A/uremia/hyponatremia/hyperkalemia - Due to MM, hypercalcemia and ACE inhibitors?  -S/p CRRT while  in ICU.   -TDC on on 5/22.  Now on HD MWF -Nephrology managing.  Currently on  iHD - not stable for d/c from HD standpoint until could also sit in a chair for HD also   Paroxysmal A-fib:  - Now in sinus rhythm. Rate controlled without meds. Unable to anticoagulate due to hematoma and intolerance.   Obstructive sleep apnea: Now with trach collar   Anxiety/bipolar disorder - Continue home Paxil  - Continue Depakote    Severe malnutrition Body mass index is 27.95 kg/m. Nutrition Problem: Severe Malnutrition Etiology: acute illness (prolonged illness, interuptions in feeding) Signs/Symptoms: moderate fat depletion, moderate muscle depletion, percent weight loss (9.6% x 1 month) Percent weight loss: 9.6 % Interventions: Prostat, Tube feeding, MVI   Pressure skin injury:     Pressure Injury 07/05/23 Heel Left;Right Unstageable - Full thickness tissue loss in which the base of the injury is covered by slough (yellow, tan, gray, green or brown) and/or eschar (tan, brown or black) in the wound bed. (Active)  07/05/23 1108  Location: Heel  Location Orientation: Left;Right  Staging: Unstageable - Full thickness tissue loss in which the base of the injury is covered by slough (yellow, tan, gray, green or brown) and/or eschar (tan, brown or black) in the wound bed.  Wound Description (Comments):   Present on Admission: Yes  Dressing Type Foam - Lift dressing to assess site every shift 08/19/23 1000     Pressure Injury 07/11/23 Buttocks Mid Unstageable - Full thickness tissue loss in which the base of the injury is covered by slough (yellow, tan, gray, green or brown) and/or eschar (tan, brown or black) in the wound bed. 8 cm x 6 cmx 0.1 cm (Active)  07/11/23 1600  Location: Buttocks  Location Orientation: Mid  Staging: Unstageable - Full thickness tissue loss in which the base of the injury is covered by slough (yellow, tan, gray, green or brown) and/or eschar (tan, brown or black) in the wound bed.  Wound Description (Comments): 8 cm x 6 cmx 0.1 cm  Present on  Admission: No  Dressing Type Foam - Lift dressing to assess site every shift 08/19/23 1000     Pressure Injury 07/27/23 Buttocks Right;Lower (Active)  07/27/23 1700  Location: Buttocks  Location Orientation: Right;Lower  Staging:   Wound Description (Comments):   Present on Admission: Yes  Dressing Type Foam - Lift dressing to assess site every shift 08/19/23 1000    Interval History:  No events overnight.  No fever in past 24 hours.  Resting comfortably in bed otherwise.  In general, main barriers for discharge are the combo of trach, PEG, HD, chemo   Old records reviewed in assessment of this patient  Antimicrobials: Voriconazole  08/05/2023 >> current Vancomycin  08/24/2023 >> current  DVT prophylaxis:  heparin  injection 5,000 Units Start: 08/14/23 2200 Place and maintain sequential compression device Start: 07/14/23 1033   Code Status:   Code Status: Full Code  Mobility Assessment (Last 72 Hours)     Mobility Assessment     Row Name 08/25/23 0400 08/25/23 0000 08/24/23 2000 08/24/23 1405 08/24/23 1002   Does patient have an order for bedrest or is patient medically unstable Yes- Bedfast (Level 1) - Complete Yes- Bedfast (Level 1) - Complete Yes- Bedfast (Level 1) - Complete No - Continue assessment No - Continue assessment   What is the highest level of mobility based on the progressive mobility assessment? Level 1 (Bedfast) - Unable to balance while sitting on edge of bed Level  1 (Bedfast) - Unable to balance while sitting on edge of bed Level 1 (Bedfast) - Unable to balance while sitting on edge of bed Level 1 (Bedfast) - Unable to balance while sitting on edge of bed Level 1 (Bedfast) - Unable to balance while sitting on edge of bed   Is the above level different from baseline mobility prior to current illness? Yes - Recommend PT order Yes - Recommend PT order Yes - Recommend PT order Yes - Recommend PT order Yes - Recommend PT order    Row Name 08/24/23 0400 08/24/23 0000  08/23/23 2000 08/23/23 1700 08/23/23 0813   Does patient have an order for bedrest or is patient medically unstable Yes- Bedfast (Level 1) - Complete Yes- Bedfast (Level 1) - Complete Yes- Bedfast (Level 1) - Complete -- No - Continue assessment   What is the highest level of mobility based on the progressive mobility assessment? Level 1 (Bedfast) - Unable to balance while sitting on edge of bed Level 1 (Bedfast) - Unable to balance while sitting on edge of bed Level 1 (Bedfast) - Unable to balance while sitting on edge of bed Level 1 (Bedfast) - Unable to balance while sitting on edge of bed Level 1 (Bedfast) - Unable to balance while sitting on edge of bed   Is the above level different from baseline mobility prior to current illness? Yes - Recommend PT order Yes - Recommend PT order Yes - Recommend PT order -- Yes - Recommend PT order    Row Name 08/22/23 2020           Does patient have an order for bedrest or is patient medically unstable No - Continue assessment       What is the highest level of mobility based on the progressive mobility assessment? Level 1 (Bedfast) - Unable to balance while sitting on edge of bed       Is the above level different from baseline mobility prior to current illness? Yes - Recommend PT order                Barriers to discharge: ? Disposition Plan:  TBD Status is: Inpt  Objective: Blood pressure 111/79, pulse 93, temperature 98 F (36.7 C), temperature source Oral, resp. rate 17, height 6' 0.01" (1.829 m), weight 119 kg, SpO2 100%.  Examination:  Physical Exam Constitutional:      Comments: Chronically ill-appearing adult man lying in bed in no distress  HENT:     Head: Normocephalic and atraumatic.     Mouth/Throat:     Mouth: Mucous membranes are dry.  Eyes:     Extraocular Movements: Extraocular movements intact.  Cardiovascular:     Rate and Rhythm: Normal rate and regular rhythm.  Pulmonary:     Effort: Pulmonary effort is normal. No  respiratory distress.     Comments: Coarse breath sounds bilaterally Chest:     Comments: Right chest wall PermCath in place Abdominal:     General: Bowel sounds are normal. There is no distension.     Palpations: Abdomen is soft.     Tenderness: There is no abdominal tenderness.     Comments: Large right thigh hematoma appreciated  Genitourinary:    Comments: Rectal tube in place Musculoskeletal:     Cervical back: Normal range of motion and neck supple.  Skin:    General: Skin is warm and dry.  Neurological:     Comments: Follows commands.  Moves all 4 extremities  Consultants:  Nephrology ID Palliative care  Oncology  Procedures:    Data Reviewed: Results for orders placed or performed during the hospital encounter of 07/03/23 (from the past 24 hours)  Glucose, capillary     Status: Abnormal   Collection Time: 08/24/23  4:01 PM  Result Value Ref Range   Glucose-Capillary 133 (H) 70 - 99 mg/dL  Glucose, capillary     Status: Abnormal   Collection Time: 08/24/23  7:31 PM  Result Value Ref Range   Glucose-Capillary 135 (H) 70 - 99 mg/dL   Comment 1 Notify RN   Glucose, capillary     Status: Abnormal   Collection Time: 08/24/23 11:07 PM  Result Value Ref Range   Glucose-Capillary 132 (H) 70 - 99 mg/dL  CBC with Differential/Platelet     Status: Abnormal   Collection Time: 08/25/23  4:19 AM  Result Value Ref Range   WBC 3.5 (L) 4.0 - 10.5 K/uL   RBC 2.86 (L) 4.22 - 5.81 MIL/uL   Hemoglobin 9.5 (L) 13.0 - 17.0 g/dL   HCT 16.1 (L) 09.6 - 04.5 %   MCV 105.6 (H) 80.0 - 100.0 fL   MCH 33.2 26.0 - 34.0 pg   MCHC 31.5 30.0 - 36.0 g/dL   RDW 40.9 (H) 81.1 - 91.4 %   Platelets 130 (L) 150 - 400 K/uL   nRBC 0.6 (H) 0.0 - 0.2 %   Neutrophils Relative % 62 %   Neutro Abs 2.2 1.7 - 7.7 K/uL   Lymphocytes Relative 21 %   Lymphs Abs 0.7 0.7 - 4.0 K/uL   Monocytes Relative 16 %   Monocytes Absolute 0.6 0.1 - 1.0 K/uL   Eosinophils Relative 0 %   Eosinophils Absolute  0.0 0.0 - 0.5 K/uL   Basophils Relative 0 %   Basophils Absolute 0.0 0.0 - 0.1 K/uL   Immature Granulocytes 1 %   Abs Immature Granulocytes 0.02 0.00 - 0.07 K/uL  Magnesium      Status: Abnormal   Collection Time: 08/25/23  4:19 AM  Result Value Ref Range   Magnesium  2.9 (H) 1.7 - 2.4 mg/dL  Renal function panel     Status: Abnormal   Collection Time: 08/25/23  4:19 AM  Result Value Ref Range   Sodium 130 (L) 135 - 145 mmol/L   Potassium 4.3 3.5 - 5.1 mmol/L   Chloride 93 (L) 98 - 111 mmol/L   CO2 21 (L) 22 - 32 mmol/L   Glucose, Bld 234 (H) 70 - 99 mg/dL   BUN 782 (H) 6 - 20 mg/dL   Creatinine, Ser 9.56 (H) 0.61 - 1.24 mg/dL   Calcium  8.2 (L) 8.9 - 10.3 mg/dL   Phosphorus 5.4 (H) 2.5 - 4.6 mg/dL   Albumin  2.5 (L) 3.5 - 5.0 g/dL   GFR, Estimated 19 (L) >60 mL/min   Anion gap 16 (H) 5 - 15  Glucose, capillary     Status: Abnormal   Collection Time: 08/25/23  6:18 AM  Result Value Ref Range   Glucose-Capillary 189 (H) 70 - 99 mg/dL   Comment 1 Notify RN   Glucose, capillary     Status: Abnormal   Collection Time: 08/25/23  7:30 AM  Result Value Ref Range   Glucose-Capillary 147 (H) 70 - 99 mg/dL  Glucose, capillary     Status: Abnormal   Collection Time: 08/25/23 11:17 AM  Result Value Ref Range   Glucose-Capillary 106 (H) 70 - 99 mg/dL    I have  reviewed pertinent nursing notes, vitals, labs, and images as necessary. I have ordered labwork to follow up on as indicated.  I have reviewed the last notes from staff over past 24 hours. I have discussed patient's care plan and test results with nursing staff, CM/SW, and other staff as appropriate.  Time spent: Greater than 50% of the 55 minute visit was spent in counseling/coordination of care for the patient as laid out in the A&P.   LOS: 53 days   Faith Homes, MD [facility] 08/25/2023, 1:28 PM

## 2023-08-25 NOTE — Plan of Care (Signed)
  Problem: Clinical Measurements: Goal: Respiratory complications will improve Outcome: Progressing Goal: Cardiovascular complication will be avoided Outcome: Progressing   Problem: Nutrition: Goal: Adequate nutrition will be maintained Outcome: Progressing   Problem: Elimination: Goal: Will not experience complications related to bowel motility Outcome: Progressing Goal: Will not experience complications related to urinary retention Outcome: Progressing   Problem: Skin Integrity: Goal: Risk for impaired skin integrity will decrease Outcome: Progressing   Problem: Metabolic: Goal: Ability to maintain appropriate glucose levels will improve Outcome: Progressing   Problem: Skin Integrity: Goal: Risk for impaired skin integrity will decrease Outcome: Progressing

## 2023-08-25 NOTE — Progress Notes (Signed)
 Modified Barium Swallow Study  Patient Details  Name: Armaan Pond. MRN: [id number] Date of Birth: July 03, 1965  Today's Date: 08/25/2023  Modified Barium Swallow completed.  Full report located under Chart Review in the Imaging Section.  History of Present Illness 58 y.o. male transferred from [facility] 07/03/23 to [facility] for management of newly diagnosed plasma cell myeloma with weakness and AMS. Pt also with AKI and metabolic encephalopathy. 5/10 transfer to [facility] for iHD.Bedside swallow eval 4/18 wiht recs for NPO due to somnolence. 4/22 Intubated and chemo initiated. 4/23-5/10 CRRT. 5/6 IVIG started. 5/8 trach. 5/12 iHD initiated. 5/18 inability to oxygenate through trach, intubated and trach revision. PMHx:Lt THA, carpal tunnel syndrome, Lt TKA, Lt humerus fx, PAF, HTN, HLD, bipolar disorder, obesity, T2DM   Clinical Impression Pt demonstrates a moderate oropharyngeal dysphagia with a DIGEST score of 2. Pt evaluated with PMSV in place, pt able to follow about 50% of commands to cough or throat clear, requires total assisted feeding. Is not initiating well, very flat affect. Pt initiates swallows in the pyriform sinuses with spillage of bolus into the vestible with sensed aspiration of thin liquids and silent aspiraiton of nectar thick liquids in only the final attempt. There is base of tongue and vallecular residue that penetrated the vestibule without pt initiation to clear.  Suspect base of tongue weakness, but also noted very bumpy irregular tissue on the velar tissue that would be contributing to residue (see screen shot below). He does respond occasionally  to verbal cues for a throat clear and second swallow. Pt to remain NPO, but would benefit from ice chips throughout the day and frequent trials of nectar thick liquids after oral care with SLP.  DIGEST Swallow Severity Rating*  Safety: 2  Efficiency:2  Overall Pharyngeal Swallow Severity: 2 1: mild; 2: moderate; 3: severe; 4:  profound  *The Dynamic Imaging Grade of Swallowing Toxicity is standardized for the head and neck cancer population, however, demonstrates promising clinical applications across populations to standardize the clinical rating of pharyngeal swallow safety and severity.  Factors that may increase risk of adverse event in presence of aspiration Roderick Civatte & Jessy Morocco 2021): Reduced cognitive function;Frail or deconditioned;Dependence for feeding and/or oral hygiene;Inadequate oral hygiene;Weak cough;Limited mobility;Poor general health and/or compromised immunity  Swallow Evaluation Recommendations Recommendations: Ice chips PRN after oral care Medication Administration: Via alternative means Oral care recommendations: Oral care before ice chips/water       Nafeesah Lapaglia, Hardin Leys 08/25/2023,2:59 PM

## 2023-08-25 NOTE — Progress Notes (Signed)
 [facility]  Date of Admission:  07/03/2023      Total days of antibiotics   Voriconazole  5/5 >> c   Vancomycin  6/05 >> c  Acyclovir  prophy        ASSESSMENT: Frank Najjar. is a 58 y.o. male admitted with:   Multiple Myeloma on Chemotherapy -  Fevers of Undetermined Significance -  Unstageable wound to sacrum - Multiple Pressure Wounds -  ?Secondary infection -  Previously high fevers through all abx/antifungals. HD Line removed 5/20 and replaced for ongoing HD needs. Fevers better after resuming fever 5/30 up until 3d ago. Purulent drainage described from central unstageable decubitus. May be worth treating short course for superficial wound infection given new unexplained fevers. MRSE in blood but in one anaerobic bottle could be contaminant.  - Continue vancomycin   - Wound care per WOC recommendations  MRSE Bacteremia 1/4 bottles - Suspect contaminant, though he has HD line in place in Right internal jugular. \ Continue vancomycin  for now.   Renal Failure -  On hemodialysis   Aspergillous Pneumonia -  Continues on voriconazole  treatment for planned 3 months (Started 5/5)  Respiratory Failure s/p Trach -  PCCM following with plans to start capping trials tomorrow.     PLAN: Continue vancomycin  for now Continue voriconazole   Repeat blood cultures pending 6/9  FU wound care notes and discharge to sacral wound infection  Principal Problem:   Multiple myeloma not having achieved remission (HCC) Active Problems:   Generalized weakness   Atrial fibrillation with rapid ventricular response (HCC)   AKI (acute kidney injury) (HCC)   Acute hypoxemic respiratory failure (HCC)   PAF (paroxysmal atrial fibrillation) (HCC)   On mechanically assisted ventilation (HCC)   Acute metabolic encephalopathy   Lobar pneumonia, unspecified organism (HCC)   Multiple myeloma without remission (HCC)   Protein-calorie malnutrition, severe    Malfunction of tracheostomy stoma (HCC)   Fever   Aspergillosis, unspecified (HCC)   Goals of care, counseling/discussion   Hematoma of right thigh   Seizure disorder (HCC)   DMII (diabetes mellitus, type 2) (HCC)    acyclovir   200 mg Per Tube BID   arformoterol   15 mcg Nebulization BID   collagenase    Topical Daily   feeding supplement (NEPRO CARB STEADY)  355 mL Per Tube TID PC & HS   feeding supplement (PROSource TF20)  60 mL Per Tube Daily   fiber  1 packet Per Tube BID   folic acid   1 mg Per Tube Daily   heparin  injection (subcutaneous)  5,000 Units Subcutaneous Q8H   insulin  aspart  0-15 Units Subcutaneous TID AC & HS   insulin  aspart  2 Units Subcutaneous TID AC & HS   insulin  glargine-yfgn  10 Units Subcutaneous BID   midodrine   10 mg Per Tube Q M,W,F-HD   montelukast   5 mg Per Tube QHS   multivitamin  1 tablet Per Tube QHS   nicotine   7 mg Transdermal Daily   nutrition supplement (JUVEN)  1 packet Per Tube BID BM   mouth rinse  15 mL Mouth Rinse 4 times per day   PARoxetine   40 mg Per Tube Daily   pneumococcal 20-valent conjugate vaccine  0.5 mL Intramuscular Tomorrow-1000   revefenacin   175 mcg Nebulization Daily   sodium chloride  flush  10-40 mL Intracatheter Q12H   thiamine   100 mg Per Tube Daily   voriconazole   350 mg Per  Tube Q12H    SUBJECTIVE: No new complaints, sleepy today   Review of Systems: Review of Systems  Constitutional:  Positive for fever. Negative for chills.  Gastrointestinal:  Negative for abdominal pain, diarrhea, nausea and vomiting.    Allergies  Allergen Reactions   Morphine And Codeine Anaphylaxis   Shellfish Allergy Anaphylaxis   Betadine  [Povidone Iodine ] Itching   Bupropion Other (See Comments)    Shaking of the body, hallucinations    Chlorhexidine  Hives    Patient reports never had issues CHG with mouth rinse.   Influenza Vaccines Hives   Metoprolol  Rash    OBJECTIVE: Vitals:   08/25/23 0345 08/25/23 0402 08/25/23 0725  08/25/23 0904  BP:   116/83   Pulse: 96 95 95   Resp:   14   Temp:  97.8 F (36.6 C) 98.3 F (36.8 C)   TempSrc:  Axillary Oral   SpO2: 97% 100% 96% 100%  Weight:      Height:       Body mass index is 35.57 kg/m.  Physical Exam Constitutional:      Appearance: Normal appearance. He is not ill-appearing.  HENT:     Head: Normocephalic.     Mouth/Throat:     Mouth: Mucous membranes are moist.     Pharynx: Oropharynx is clear.  Eyes:     General: No scleral icterus. Cardiovascular:     Rate and Rhythm: Normal rate.  Pulmonary:     Effort: Pulmonary effort is normal.  Musculoskeletal:        General: Normal range of motion.     Cervical back: Normal range of motion.  Skin:    Coloration: Skin is not jaundiced or pale.  Neurological:     Mental Status: He is alert and oriented to person, place, and time.  Psychiatric:        Mood and Affect: Mood normal.        Judgment: Judgment normal.   Unable to turn to examine sacrum.  HD line is clean and non tender in right IJ  Lab Results Lab Results  Component Value Date   WBC 3.5 (L) 08/25/2023   HGB 9.5 (L) 08/25/2023   HCT 30.2 (L) 08/25/2023   MCV 105.6 (H) 08/25/2023   PLT 130 (L) 08/25/2023    Lab Results  Component Value Date   CREATININE 3.58 (H) 08/25/2023   BUN 174 (H) 08/25/2023   NA 130 (L) 08/25/2023   K 4.3 08/25/2023   CL 93 (L) 08/25/2023   CO2 21 (L) 08/25/2023    Lab Results  Component Value Date   ALT 18 08/20/2023   AST 20 08/20/2023   ALKPHOS 41 08/20/2023   BILITOT 0.6 08/20/2023     Microbiology: Recent Results (from the past 240 hours)  Urine Culture     Status: None   Collection Time: 08/23/23  6:18 AM   Specimen: Urine, Random  Result Value Ref Range Status   Specimen Description URINE, RANDOM  Final   Special Requests NONE Reflexed from [id number]  Final   Culture   Final    NO GROWTH Performed at [facility], [address], [place], [place] [address]    Report Status  08/24/2023 FINAL  Final  Culture, blood (Routine X 2) w Reflex to ID Panel     Status: None (Preliminary result)   Collection Time: 08/23/23  9:35 AM   Specimen: BLOOD LEFT HAND  Result Value Ref Range Status  Specimen Description BLOOD LEFT HAND  Final   Special Requests   Final    BOTTLES DRAWN AEROBIC ONLY Blood Culture adequate volume   Culture   Final    NO GROWTH 2 DAYS Performed at [facility], [address] [place], [place] [address]    Report Status PENDING  Incomplete  Culture, blood (Routine X 2) w Reflex to ID Panel     Status: Abnormal   Collection Time: 08/23/23  9:35 AM   Specimen: BLOOD RIGHT ARM  Result Value Ref Range Status   Specimen Description BLOOD RIGHT ARM  Final   Special Requests   Final    BOTTLES DRAWN AEROBIC AND ANAEROBIC Blood Culture adequate volume   Culture  Setup Time   Final    GRAM POSITIVE COCCI IN TETRADS AEROBIC BOTTLE ONLY CRITICAL RESULT CALLED TO, READ BACK BY AND VERIFIED WITH: PHARMD T. DANG [id number] AT 1330 BY EC    Culture (A)  Final    STAPHYLOCOCCUS EPIDERMIDIS THE SIGNIFICANCE OF ISOLATING THIS ORGANISM FROM A SINGLE SET OF BLOOD CULTURES WHEN MULTIPLE SETS ARE DRAWN IS UNCERTAIN. PLEASE NOTIFY THE MICROBIOLOGY DEPARTMENT WITHIN ONE WEEK IF SPECIATION AND SENSITIVITIES ARE REQUIRED. Performed at [facility], [address], [place], [place] [address]    Report Status 08/25/2023 FINAL  Final  Blood Culture ID Panel (Reflexed)     Status: Abnormal   Collection Time: 08/23/23  9:35 AM  Result Value Ref Range Status   Enterococcus faecalis NOT DETECTED NOT DETECTED Final   Enterococcus Faecium NOT DETECTED NOT DETECTED Final   Listeria monocytogenes NOT DETECTED NOT DETECTED Final   Staphylococcus species DETECTED (A) NOT DETECTED Final    Comment: CRITICAL RESULT CALLED TO, READ BACK BY AND VERIFIED WITH: PHARMD T. DANG [id number] AT 1330 BY EC    Staphylococcus aureus (BCID) NOT DETECTED NOT DETECTED Final    Staphylococcus epidermidis DETECTED (A) NOT DETECTED Final    Comment: Methicillin (oxacillin) resistant coagulase negative staphylococcus. Possible blood culture contaminant (unless isolated from more than one blood culture draw or clinical case suggests pathogenicity). No antibiotic treatment is indicated for blood  culture contaminants. CRITICAL RESULT CALLED TO, READ BACK BY AND VERIFIED WITH: PHARMD T. DANG [id number] AT 1330 BY EC    Staphylococcus lugdunensis NOT DETECTED NOT DETECTED Final   Streptococcus species NOT DETECTED NOT DETECTED Final   Streptococcus agalactiae NOT DETECTED NOT DETECTED Final   Streptococcus pneumoniae NOT DETECTED NOT DETECTED Final   Streptococcus pyogenes NOT DETECTED NOT DETECTED Final   A.calcoaceticus-baumannii NOT DETECTED NOT DETECTED Final   Bacteroides fragilis NOT DETECTED NOT DETECTED Final   Enterobacterales NOT DETECTED NOT DETECTED Final   Enterobacter cloacae complex NOT DETECTED NOT DETECTED Final   Escherichia coli NOT DETECTED NOT DETECTED Final   Klebsiella aerogenes NOT DETECTED NOT DETECTED Final   Klebsiella oxytoca NOT DETECTED NOT DETECTED Final   Klebsiella pneumoniae NOT DETECTED NOT DETECTED Final   Proteus species NOT DETECTED NOT DETECTED Final   Salmonella species NOT DETECTED NOT DETECTED Final   Serratia marcescens NOT DETECTED NOT DETECTED Final   Haemophilus influenzae NOT DETECTED NOT DETECTED Final   Neisseria meningitidis NOT DETECTED NOT DETECTED Final   Pseudomonas aeruginosa NOT DETECTED NOT DETECTED Final   Stenotrophomonas maltophilia NOT DETECTED NOT DETECTED Final   Candida albicans NOT DETECTED NOT DETECTED Final   Candida auris NOT DETECTED NOT DETECTED Final   Candida glabrata NOT DETECTED NOT DETECTED Final  Candida krusei NOT DETECTED NOT DETECTED Final   Candida parapsilosis NOT DETECTED NOT DETECTED Final   Candida tropicalis NOT DETECTED NOT DETECTED Final   Cryptococcus neoformans/gattii NOT  DETECTED NOT DETECTED Final   Methicillin resistance mecA/C DETECTED (A) NOT DETECTED Final    Comment: CRITICAL RESULT CALLED TO, READ BACK BY AND VERIFIED WITH: PHARMD T. DANG [phone] AT 1330 BY EC Performed at [facility], [address] [place], [place] [address]     Gibson Kurtz, MSN, NP-C [facility] Pager: [phone]  @TODAY @ 11:04 AM

## 2023-08-25 NOTE — Progress Notes (Signed)
   08/25/23 1859  Vitals  Temp 98.7 F (37.1 C)  Pulse Rate (!) 115  Resp (!) 22  BP (!) 164/87  SpO2 98 %  Weight (S)   (No Weight Bed)  Type of Weight Post-Dialysis  Oxygen Therapy  O2 Flow Rate (L/min) 6 L/min  Patient Activity (if Appropriate) In bed  Pulse Oximetry Type Continuous  Post Treatment  Dialyzer Clearance Clear  Hemodialysis Intake (mL) 0 mL  Liters Processed 84  Fluid Removed (mL) 1500 mL  Post-Hemodialysis Comments Tx. completed and pt. resting without difficulties. UF goal met per order and Admin medication per.order. Report call to 3W Bedside RN.   Received patient in bed to unit.  Alert and oriented.  Informed consent signed and in chart.   TX duration: 3.5  Patient tolerated well.  Transported back to the room  Alert, without acute distress.  Hand-off given to patient's nurse.   Access used: Yes Access issues: No  Total UF removed: 1500 Medication(s) given: See MAR Post HD VS: See Above Grid Post HD weight: No Weight  Bed   [facility]

## 2023-08-25 NOTE — Consult Note (Signed)
 WOC Nurse wound follow up Wound type: Unstageable sacrum  R Buttocks stage 3 L buttocks stage 2 Measurement: Unstageable sacrum 5 cm x 4 cm  R Buttocks stage 3: 6 cm x 4cm  L buttocks stage 2 1 cm x 1 cm  Wound bed: Unstageable sacrum: 100 % covered in yellow slough L Buttocks stage 3: pink moist with scattered adipose tissue and granulation tissue R buttocks stage 2: pink moist Drainage (amount, consistency, odor)  Unstageable sacrum: purulent  R Buttocks stage 3:  serosanguineous  L buttocks stage 2: serosanguineous Periwound: intact, flexiseal in place  Dressing procedure/placement/frequency: Unstageable sacrum:  Cleanse wound with Vashe (lawson [phone]) allow to air dry, apply santyl  to wound bed, cover with saline moistened gauze and foam dressing, change daily and PRN soiling.  R buttocks/ L buttocks: cleanse wound with Vashe Timm Foot # [phone]) allow to air dry and cover with foam dressing, lift dressing daily to view wound and cleanse and change foam dressing Q 3 days and PRN soiling.  WOC team will continue to folllow patient weekly, please re-consult if new needs arise.  Gillermo Lack, RN, MSN, [facility] WOC Team

## 2023-08-25 NOTE — Progress Notes (Signed)
 Speech Language Pathology Treatment: Dysphagia;Passy Muir Speaking valve  Patient Details Name: Frank [name]. MRN: [id number] DOB: 12/23/1965 Today's Date: 08/25/2023 Time: 9528-4132 SLP Time Calculation (min) (ACUTE ONLY): 21 min  Assessment / Plan / Recommendation Clinical Impression  Pt alert today, PMSV placed and pt needed a model to repeat; could not elicit spontaneous speech or communication from him. Voice just a little breathy. When asked if he wanted water  pt nodded enthusiastically and repeated "water " when asked. SLP brushed pts teeth, provided ice then sips of water . Pt had an occasional cough during session and also coughed once after water . Tolerated and seemed to enjoy applesauce. Ready for MBS to determine ability to start diet. Pt also can weak PMSV with full staff supervision. MD may change to a cuffless soon.     HPI HPI: 58 y.o. male transferred from [facility] 07/03/23 to [facility] for management of newly diagnosed plasma cell myeloma with weakness and AMS. Pt also with AKI and metabolic encephalopathy. 5/10 transfer to [facility] for iHD.Bedside swallow eval 4/18 wiht recs for NPO due to somnolence. 4/22 Intubated and chemo initiated. 4/23-5/10 CRRT. 5/6 IVIG started. 5/8 trach. 5/12 iHD initiated. 5/18 inability to oxygenate through trach, intubated and trach revision. PMHx:Lt THA, carpal tunnel syndrome, Lt TKA, Lt humerus fx, PAF, HTN, HLD, bipolar disorder, obesity, T2DM      SLP Plan  MBS          Recommendations         Patient may use Passy-Muir Speech Valve: Intermittently with supervision;During all therapies with supervision PMSV Supervision: Full MD: Please consider changing trach tube to : Cuffless           Oral care QID           MBS     Frank [name], Frank [name]  08/25/2023, 12:02 PM

## 2023-08-26 ENCOUNTER — Encounter (HOSPITAL_COMMUNITY): Payer: Self-pay | Admitting: Hematology & Oncology

## 2023-08-26 DIAGNOSIS — N19 Unspecified kidney failure: Secondary | ICD-10-CM | POA: Diagnosis not present

## 2023-08-26 DIAGNOSIS — N179 Acute kidney failure, unspecified: Secondary | ICD-10-CM | POA: Diagnosis not present

## 2023-08-26 DIAGNOSIS — B9562 Methicillin resistant Staphylococcus aureus infection as the cause of diseases classified elsewhere: Secondary | ICD-10-CM | POA: Diagnosis not present

## 2023-08-26 DIAGNOSIS — Z992 Dependence on renal dialysis: Secondary | ICD-10-CM

## 2023-08-26 DIAGNOSIS — Z7189 Other specified counseling: Secondary | ICD-10-CM | POA: Diagnosis not present

## 2023-08-26 DIAGNOSIS — R7881 Bacteremia: Secondary | ICD-10-CM | POA: Diagnosis not present

## 2023-08-26 DIAGNOSIS — C9000 Multiple myeloma not having achieved remission: Secondary | ICD-10-CM | POA: Diagnosis not present

## 2023-08-26 DIAGNOSIS — J9601 Acute respiratory failure with hypoxia: Secondary | ICD-10-CM | POA: Diagnosis not present

## 2023-08-26 LAB — CBC WITH DIFFERENTIAL/PLATELET
Abs Immature Granulocytes: 0.02 10*3/uL (ref 0.00–0.07)
Basophils Absolute: 0 10*3/uL (ref 0.0–0.1)
Basophils Relative: 0 %
Eosinophils Absolute: 0 10*3/uL (ref 0.0–0.5)
Eosinophils Relative: 0 %
HCT: 28.1 % — ABNORMAL LOW (ref 39.0–52.0)
Hemoglobin: 9 g/dL — ABNORMAL LOW (ref 13.0–17.0)
Immature Granulocytes: 1 %
Lymphocytes Relative: 18 %
Lymphs Abs: 0.6 10*3/uL — ABNORMAL LOW (ref 0.7–4.0)
MCH: 33.5 pg (ref 26.0–34.0)
MCHC: 32 g/dL (ref 30.0–36.0)
MCV: 104.5 fL — ABNORMAL HIGH (ref 80.0–100.0)
Monocytes Absolute: 0.4 10*3/uL (ref 0.1–1.0)
Monocytes Relative: 12 %
Neutro Abs: 2.2 10*3/uL (ref 1.7–7.7)
Neutrophils Relative %: 69 %
Platelets: 110 10*3/uL — ABNORMAL LOW (ref 150–400)
RBC: 2.69 MIL/uL — ABNORMAL LOW (ref 4.22–5.81)
RDW: 22.5 % — ABNORMAL HIGH (ref 11.5–15.5)
WBC: 3.2 10*3/uL — ABNORMAL LOW (ref 4.0–10.5)
nRBC: 0.6 % — ABNORMAL HIGH (ref 0.0–0.2)

## 2023-08-26 LAB — GLUCOSE, CAPILLARY
Glucose-Capillary: 92 mg/dL (ref 70–99)
Glucose-Capillary: 166 mg/dL — ABNORMAL HIGH (ref 70–99)
Glucose-Capillary: 114 mg/dL — ABNORMAL HIGH (ref 70–99)

## 2023-08-26 LAB — RENAL FUNCTION PANEL
Albumin: 2.4 g/dL — ABNORMAL LOW (ref 3.5–5.0)
Anion gap: 12 (ref 5–15)
BUN: 89 mg/dL — ABNORMAL HIGH (ref 6–20)
CO2: 24 mmol/L (ref 22–32)
Calcium: 8.3 mg/dL — ABNORMAL LOW (ref 8.9–10.3)
Chloride: 95 mmol/L — ABNORMAL LOW (ref 98–111)
Creatinine, Ser: 2.07 mg/dL — ABNORMAL HIGH (ref 0.61–1.24)
GFR, Estimated: 36 mL/min — ABNORMAL LOW (ref >60)
Glucose, Bld: 107 mg/dL — ABNORMAL HIGH (ref 70–99)
Phosphorus: 3.4 mg/dL (ref 2.5–4.6)
Potassium: 3.8 mmol/L (ref 3.5–5.1)
Sodium: 131 mmol/L — ABNORMAL LOW (ref 135–145)

## 2023-08-26 LAB — MAGNESIUM: Magnesium: 2.6 mg/dL — ABNORMAL HIGH (ref 1.7–2.4)

## 2023-08-26 MED ORDER — MIDODRINE HCL 5 MG PO TABS
10.0000 mg | ORAL_TABLET | Freq: Once | ORAL | Status: AC
  Administered 2023-08-26: 10 mg via ORAL
  Filled 2023-08-26: qty 2

## 2023-08-26 NOTE — Progress Notes (Signed)
 Pt's trach was dislodged, RT able to place trach back in place. Positive C02 color changed afterwards, clear bilateral BS. No distress noted.

## 2023-08-26 NOTE — Progress Notes (Signed)
   08/26/23 1750  Vitals  Temp 97.9 F (36.6 C)  Pulse Rate 98  Resp (!) 22  BP (!) 120/90  SpO2 99 %  O2 Device Room Air  Post Treatment  Dialyzer Clearance Lightly streaked  Hemodialysis Intake (mL) 0 mL  Liters Processed 84  Fluid Removed (mL) 500 mL  Tolerated HD Treatment Yes   Received patient in bed to unit.  Alert and oriented.  Informed consent signed and in chart.   TX duration:3.5hrs  Patient tolerated well.  Transported back to the room  Alert, without acute distress.  Hand-off given to patient's nurse.   Access used: [facility] Access issues: none  Total UF removed: Medication(s) given: none    [facility]

## 2023-08-26 NOTE — Progress Notes (Signed)
 Progress Note    Frank [name].   ZOX:[id number]  DOB: 07-May-1965  DOA: 07/03/2023     53 PCP: Serita Danes, MD  Initial CC: Weakness  Hospital Course: Mr. Buras is a 58 year old male with PMH of OSA on CPAP, pathological fracture and multiple ORIF left humerus likely due to multiple myeloma, CKD-3A, A-fib not on AC, asthma, HTN, HLD and tobacco use disorder who had been feeling weak for about 1 week, admitted to [facility] with concern for sepsis, community-acquired pneumonia and acute encephalopathy.  Patient was found to have lytic lesions, he was transferred to [facility] for oncology treatment.  In the meantime, he developed encephalopathy, acute renal failure. He remained in the ICU for 36 days.  Underwent tracheostomy. Deemed to be not stable for PEG tube placement.  He has been on tube feeds via cortrack.  Also started on dialysis after CRRT.   Significant events 4/17-admitted to medical floor with oncology consultation to treat multiple myeloma. 4/19-transferred to ICU intubated, encephalopathic. 4/20-A-fib with RVR.  Started heparin . Groin and biopsy site hematoma requiring multiple transfusions EEG 4/25 >> moderate to severe encephalopathy without any seizures or epileptiform discharges 4/29 Received Velcade  and Cytoxan  chemotherapy-intent palliative? 5/2 LP and ETT exchange  5/8 Trach placed. Some post op bleeding improved with thrombi-pad 5/10 voriconazole  added back based on CT Chest findings 5/13 more alert and able to follow some commands. 5/23, transferred to floor. 5/29-PEG placed 5/31 chemotherapy changed to Sarclisa /Kyprolis /Cytoxan .  Assessment and plan:  Recurrent fever/SIRS - Intermittent fevers being monitored - suspected MM induced but getting blood cultures on 6/7 to watch and also CXR, UA (per oncology); the CXR looks improved; UA difficult to interpret as likely colonized too - blood cultures drawn 6/7 (1/3 bottles with MRSE so  far); discussed with ID, will start on vanc for now and watch cultures longer  - Repeat blood cultures ordered 08/25/2023 - Unstageable sacral ulcer needs to be monitored as well; continue WOC rec's  Aspergillosis with pneumonia -On voriconazole  per ID.  ID to decide duration -Also on acyclovir   Multiple myeloma: Initially started on Velcade  and Cytoxan .  Chemotherapy changed to Sarclisa /Kyprolis /Cytoxan  on 5/31 - Oncology following  Goal of care - Poor long-term prognosis  - Palliative from prior providers had discussion with patient's wife - Remains full code with full scope of care - need to revisit goals if/when he has further deterioration - main plan for dispo includes finding a facility that can accept: Trach, PEG, HD, Chemo (pulm is attempting to downsize and cap trach further which may help some with placement but other barriers still significant)  Acute hypoxic respiratory failure -Pulmonology following intermittently; recommended keeping trach collar.  On #6 cuffed trach -Continue chest physiotherapy as needed  Right thigh hematoma - per CT on 08/01/23: "12.1 x 30.2 x 7.6 cm subcutaneous hematoma within the anterior right thigh. 8.4 x 10.4 x 22.2 cm subcutaneous hematoma within the right gluteal region - continue supportive care - trend CBC   Severe acute metabolic encephalopathy with hypoactive delirium Seizure disorder - Normal MRI brain.  LP negative for meningitis or encephalitis.  EEG negative for seizure but encephalopathy.   - Home Depakote  discontinued in ICU but resumed later.  Markedly elevated BUN -Reorientation and delirium precaution -Discontinued Klonopin  on 5/30 -Depacon  resumed on 5/25 -Hemodialysis for uremia   NIDDM-2 with hyperglycemia:  - A1c 6.0% (was 6.7% in 01/2023) -Continue Semglee  and SSI -Further adjustment as appropriate.   AKI on  CKD-3A/uremia/hyponatremia/hyperkalemia - Due to MM, hypercalcemia and ACE inhibitors?  -S/p CRRT while in  ICU.   -TDC on on 5/22.  Now on HD MWF -Nephrology managing.  Currently on iHD - not stable for d/c from HD standpoint until could also sit in a chair for HD also   Paroxysmal A-fib:  - Now in sinus rhythm. Rate controlled without meds. Unable to anticoagulate due to hematoma and intolerance.   Obstructive sleep apnea: Now with trach collar   Anxiety/bipolar disorder - Continue home Paxil  - Continue Depakote    Severe malnutrition Body mass index is 27.95 kg/m. Nutrition Problem: Severe Malnutrition Etiology: acute illness (prolonged illness, interuptions in feeding) Signs/Symptoms: moderate fat depletion, moderate muscle depletion, percent weight loss (9.6% x 1 month) Percent weight loss: 9.6 % Interventions: Prostat, Tube feeding, MVI   Pressure skin injury:     Pressure Injury 07/05/23 Heel Left;Right Unstageable - Full thickness tissue loss in which the base of the injury is covered by slough (yellow, tan, gray, green or brown) and/or eschar (tan, brown or black) in the wound bed. (Active)  07/05/23 1108  Location: Heel  Location Orientation: Left;Right  Staging: Unstageable - Full thickness tissue loss in which the base of the injury is covered by slough (yellow, tan, gray, green or brown) and/or eschar (tan, brown or black) in the wound bed.  Wound Description (Comments):   Present on Admission: Yes  Dressing Type Foam - Lift dressing to assess site every shift 08/19/23 1000     Pressure Injury 07/11/23 Buttocks Mid Unstageable - Full thickness tissue loss in which the base of the injury is covered by slough (yellow, tan, gray, green or brown) and/or eschar (tan, brown or black) in the wound bed. 8 cm x 6 cmx 0.1 cm (Active)  07/11/23 1600  Location: Buttocks  Location Orientation: Mid  Staging: Unstageable - Full thickness tissue loss in which the base of the injury is covered by slough (yellow, tan, gray, green or brown) and/or eschar (tan, brown or black) in the wound  bed.  Wound Description (Comments): 8 cm x 6 cmx 0.1 cm  Present on Admission: No  Dressing Type Foam - Lift dressing to assess site every shift 08/19/23 1000     Pressure Injury 07/27/23 Buttocks Right;Lower (Active)  07/27/23 1700  Location: Buttocks  Location Orientation: Right;Lower  Staging:   Wound Description (Comments):   Present on Admission: Yes  Dressing Type Foam - Lift dressing to assess site every shift 08/19/23 1000    Interval History:  No events overnight.  No fever in past 24 hours. Resting comfortably in bed otherwise.  In general, main barriers for discharge are the combo of trach, PEG, HD, chemo   Old records reviewed in assessment of this patient  Antimicrobials: Voriconazole  08/05/2023 >> current Vancomycin  08/24/2023 >> current  DVT prophylaxis:  heparin  injection 5,000 Units Start: 08/14/23 2200 Place and maintain sequential compression device Start: 07/14/23 1033   Code Status:   Code Status: Full Code  Mobility Assessment (Last 72 Hours)     Mobility Assessment     Row Name 08/26/23 0800 08/25/23 2121 08/25/23 0800 08/25/23 0400 08/25/23 0000   Does patient have an order for bedrest or is patient medically unstable Yes- Bedfast (Level 1) - Complete Yes- Bedfast (Level 1) - Complete Yes- Bedfast (Level 1) - Complete Yes- Bedfast (Level 1) - Complete Yes- Bedfast (Level 1) - Complete   What is the highest level of mobility based on the progressive  mobility assessment? Level 1 (Bedfast) - Unable to balance while sitting on edge of bed Level 1 (Bedfast) - Unable to balance while sitting on edge of bed Level 1 (Bedfast) - Unable to balance while sitting on edge of bed Level 1 (Bedfast) - Unable to balance while sitting on edge of bed Level 1 (Bedfast) - Unable to balance while sitting on edge of bed   Is the above level different from baseline mobility prior to current illness? Yes - Recommend PT order Yes - Recommend PT order Yes - Recommend PT order Yes  - Recommend PT order Yes - Recommend PT order    Row Name 08/24/23 2000 08/24/23 1405 08/24/23 1002 08/24/23 0400 08/24/23 0000   Does patient have an order for bedrest or is patient medically unstable Yes- Bedfast (Level 1) - Complete No - Continue assessment No - Continue assessment Yes- Bedfast (Level 1) - Complete Yes- Bedfast (Level 1) - Complete   What is the highest level of mobility based on the progressive mobility assessment? Level 1 (Bedfast) - Unable to balance while sitting on edge of bed Level 1 (Bedfast) - Unable to balance while sitting on edge of bed Level 1 (Bedfast) - Unable to balance while sitting on edge of bed Level 1 (Bedfast) - Unable to balance while sitting on edge of bed Level 1 (Bedfast) - Unable to balance while sitting on edge of bed   Is the above level different from baseline mobility prior to current illness? Yes - Recommend PT order Yes - Recommend PT order Yes - Recommend PT order Yes - Recommend PT order Yes - Recommend PT order    Row Name 08/23/23 2000 08/23/23 1700         Does patient have an order for bedrest or is patient medically unstable Yes- Bedfast (Level 1) - Complete --      What is the highest level of mobility based on the progressive mobility assessment? Level 1 (Bedfast) - Unable to balance while sitting on edge of bed Level 1 (Bedfast) - Unable to balance while sitting on edge of bed      Is the above level different from baseline mobility prior to current illness? Yes - Recommend PT order --               Barriers to discharge: ? Disposition Plan:  TBD Status is: Inpt  Objective: Blood pressure 103/82, pulse 99, temperature 98.1 F (36.7 C), temperature source Oral, resp. rate 17, height 6' 0.01" (1.829 m), weight 119 kg, SpO2 99%.  Examination:  Physical Exam Constitutional:      Comments: Chronically ill-appearing adult man lying in bed in no distress  HENT:     Head: Normocephalic and atraumatic.     Mouth/Throat:      Mouth: Mucous membranes are dry.  Eyes:     Extraocular Movements: Extraocular movements intact.  Cardiovascular:     Rate and Rhythm: Normal rate and regular rhythm.  Pulmonary:     Effort: Pulmonary effort is normal. No respiratory distress.     Comments: Coarse breath sounds bilaterally Chest:     Comments: Right chest wall PermCath in place Abdominal:     General: Bowel sounds are normal. There is no distension.     Palpations: Abdomen is soft.     Tenderness: There is no abdominal tenderness.     Comments: Large right thigh hematoma appreciated  Genitourinary:    Comments: Rectal tube in place Musculoskeletal:  Cervical back: Normal range of motion and neck supple.  Skin:    General: Skin is warm and dry.  Neurological:     Comments: Follows commands.  Moves all 4 extremities      Consultants:  Nephrology ID Palliative care  Oncology  Procedures:    Data Reviewed: Results for orders placed or performed during the hospital encounter of 07/03/23 (from the past 24 hours)  Glucose, capillary     Status: Abnormal   Collection Time: 08/25/23 10:16 PM  Result Value Ref Range   Glucose-Capillary 197 (H) 70 - 99 mg/dL  CBC with Differential/Platelet     Status: Abnormal   Collection Time: 08/26/23  4:33 AM  Result Value Ref Range   WBC 3.2 (L) 4.0 - 10.5 K/uL   RBC 2.69 (L) 4.22 - 5.81 MIL/uL   Hemoglobin 9.0 (L) 13.0 - 17.0 g/dL   HCT 16.1 (L) 09.6 - 04.5 %   MCV 104.5 (H) 80.0 - 100.0 fL   MCH 33.5 26.0 - 34.0 pg   MCHC 32.0 30.0 - 36.0 g/dL   RDW 40.9 (H) 81.1 - 91.4 %   Platelets 110 (L) 150 - 400 K/uL   nRBC 0.6 (H) 0.0 - 0.2 %   Neutrophils Relative % 69 %   Neutro Abs 2.2 1.7 - 7.7 K/uL   Lymphocytes Relative 18 %   Lymphs Abs 0.6 (L) 0.7 - 4.0 K/uL   Monocytes Relative 12 %   Monocytes Absolute 0.4 0.1 - 1.0 K/uL   Eosinophils Relative 0 %   Eosinophils Absolute 0.0 0.0 - 0.5 K/uL   Basophils Relative 0 %   Basophils Absolute 0.0 0.0 - 0.1 K/uL    Immature Granulocytes 1 %   Abs Immature Granulocytes 0.02 0.00 - 0.07 K/uL  Magnesium      Status: Abnormal   Collection Time: 08/26/23  4:33 AM  Result Value Ref Range   Magnesium  2.6 (H) 1.7 - 2.4 mg/dL  Renal function panel     Status: Abnormal   Collection Time: 08/26/23  4:33 AM  Result Value Ref Range   Sodium 131 (L) 135 - 145 mmol/L   Potassium 3.8 3.5 - 5.1 mmol/L   Chloride 95 (L) 98 - 111 mmol/L   CO2 24 22 - 32 mmol/L   Glucose, Bld 107 (H) 70 - 99 mg/dL   BUN 89 (H) 6 - 20 mg/dL   Creatinine, Ser 7.82 (H) 0.61 - 1.24 mg/dL   Calcium  8.3 (L) 8.9 - 10.3 mg/dL   Phosphorus 3.4 2.5 - 4.6 mg/dL   Albumin  2.4 (L) 3.5 - 5.0 g/dL   GFR, Estimated 36 (L) >60 mL/min   Anion gap 12 5 - 15  Glucose, capillary     Status: None   Collection Time: 08/26/23  7:11 AM  Result Value Ref Range   Glucose-Capillary 92 70 - 99 mg/dL    I have reviewed pertinent nursing notes, vitals, labs, and images as necessary. I have ordered labwork to follow up on as indicated.  I have reviewed the last notes from staff over past 24 hours. I have discussed patient's care plan and test results with nursing staff, CM/SW, and other staff as appropriate.  Time spent: Greater than 50% of the 55 minute visit was spent in counseling/coordination of care for the patient as laid out in the A&P.   LOS: 54 days   Faith Homes, MD [facility] 08/26/2023, 11:29 AM

## 2023-08-26 NOTE — Progress Notes (Signed)
 I really think that [name] is improving.  He definitely is more alert.  He is more awake in my opinion.  So far, he tolerated chemotherapy well.  He has had no obvious nausea or vomiting.  He does have the G-tube in in which she is getting fed.  I do not think of any temperatures for a while.  He is due for his next cycle of chemotherapy on Thursday.  His labs show sodium 131.  Potassium 3.8.  BUN 89 creatinine 2.07.  Calcium  8.3 with an albumin  of 2.4.  His white count 3.2.  ANC is 2.2.  Hemoglobin 9 with hematocrit of 28.1.  Platelet count 110,000.  Again, he had 1 positive blood culture for Staph epi on 08/23/2023.  I have to believe this is a contaminant.  He is on vancomycin .  He continues on voriconazole .  On his physical exam, vital signs are temperature 98.  Pulse 103.  Blood pressure 1 6/76.  Lungs sound pretty clear bilaterally.  He has good air movement bilaterally.  Cardiac exam regular rate and rhythm with a normal S1 and S2.  Abdomen is soft.  G-tube is in tact.  Bowel sounds are present.  He has no guarding or rebound tenderness.  Extremity shows no clubbing, cyanosis or edema.  Again I had to believe that [name] is improving from a medical point of view.  Again he seems to be much more alert.  I forgot to mention that he does have a lot of oral issues.  His oral mucosa is incredibly dry.  Maybe, he can have Biotene swabs in his oral cavity to try to clean it out.  Again I still plan on doing treatment on Thursday.  We will have to see how his blood counts look.  Maybe, he will be able to be discharged at some point.  I know he is still getting dialysis.   [name], MD  Psalms [phone]

## 2023-08-26 NOTE — Progress Notes (Signed)
 Nutrition Follow-up  DOCUMENTATION CODES:  Severe malnutrition in context of acute illness/injury  INTERVENTION:  Continue bolus tube feeding via PEG: 6 cartons of Nepro 1.8 per day (1.5 cartons x 4 feeds per day) Will ensure there is a 2 hour break for voriconazole  administration BID (0900-1100, 2100-2300) Prosource TF20 60mL 1x/d Flush with 30mL before and after each blus feed (60mL 4x/d) This will provide 2600 kcal, 134g of protein, and of free water  ( TF+flush) Continue Juven 1 packet BID via tube, each packet provides 95 calories, 2.5 grams of protein (collagen) + micronutrients to support wound healing Continue Renal MVI daily via tube Nutrasource fiber BID  NUTRITION DIAGNOSIS:  Severe Malnutrition related to acute illness (prolonged illness, interuptions in feeding) as evidenced by moderate fat depletion, moderate muscle depletion, percent weight loss (9.6% x 1 month). - remains applicable  GOAL:  Patient will meet greater than or equal to 90% of their needs - met with TF  MONITOR:  TF tolerance, I & O's, Labs, Weight trends  REASON FOR ASSESSMENT:  Consult Assessment of nutrition requirement/status  ASSESSMENT:  58 y.o. male with PMH of obesity, asthma, OSA on CPAP, GERD, HTN, HLD who presented due to feeling weak with AMS for a few days. Admitted with concern for sepsis, community-acquired pneumonia, encephalopathy as well as concern for multiple myeloma.  4/17 Admit to [facility] 4/21 SLP eval - NPO; IR attempted Cortrak but unable to be placed; CCM MD successfully placed NGT 4/22 Concern for aspiration overnight; Intubated 4/23 - CRRT 5/8 - tracheostomy 5/10 - CRRT discontinued 5/11 - transferred to [facility] 5/12 - iHD initiated  5/29 - PEG placed 5/31 - chemotherapy changed to Sarclisa /Kyprolis /Cytoxan  6/9 - MBS, NPO recommended   Pt resting in bed at the time of assessment. More awake today than at previous visit. Did shake and nod head to  some questions.   Discussed with RN, states that pt is tolerating his bolus feeds well. Stool also being recorded as type 6 now. Will continue current regimen as ordered.  Do not think current weight is accurate  Admit weight: 110.6 kg (4/17) Current weight: 119 kg (6/4) - likely inaccurate  Intake/Output Summary (Last 24 hours) at 08/26/2023 1011 Last data filed at 08/26/2023 0800 Gross per 24 hour  Intake 345 ml  Output 2150 ml  Net -1805 ml  Net IO Since Admission: -1,378.27 mL [08/26/23 1011] UOP 650 mL x 24 hours FMS  - 2x recorded 24 hours (type 6)  Drains/Lines: Tracheostomy #4 Shiley uncuffed Tunneled HD catheter, right IJ, double lumen PEG 20 Fr. LUQ  Nutrition Related Medications:  Scheduled Meds:  acyclovir   200 mg Per Tube BID   NEPRO CARB STEADY  355 mL Per Tube TID PC & HS   PROSource TF20  60 mL Per Tube Daily   fiber  1 packet Per Tube BID   folic acid   1 mg Per Tube Daily   insulin  aspart  0-15 Units Subcutaneous TID AC & HS   insulin  aspart  2 Units Subcutaneous TID AC & HS   insulin  glargine-yfgn  10 Units Subcutaneous BID   multivitamin  1 tablet Per Tube QHS   JUVEN  1 packet Per Tube BID BM   thiamine   100 mg Per Tube Daily   voriconazole   350 mg Per Tube Q12H   Continuous Infusions:  valproate sodium  750 mg (08/26/23 0935)   vancomycin  1,000 mg (08/25/23 1722)   PRN Meds: ondansetron   Labs Reviewed: Sodium  131, chloride 95 BUN 89, creatinine 2.07 Magnesium  2.6 CBG ranges from 92-197 mg/dL over the last 24 hours HgbA1c 6.0%  Micronutrient Labs: CRP 1.5 (H) Vitamin A  42.5 (WNL) Zinc  73 (WNL) Folate 17.8 (WNL)  Diet Order:   Diet Order             Diet NPO time specified  Diet effective now                  EDUCATION NEEDS:  Not appropriate for education at this time  Skin: Skin Integrity Issues Per WOC note 6/9 Unstageable  - sacrum 5 cm x 4 cm  Stage 3:  - R Buttocks 6 cm x 4cm  Stage 2: - L buttocks 1 cm x 1 cm    Last BM:  6/9 - type 6 x 2 FMS placed on 4/20  Height:  Ht Readings from Last 1 Encounters:  07/22/23 6' 0.01" (1.829 m)   Weight:  Wt Readings from Last 1 Encounters:  06/05/23 113.7 kg   Ideal Body Weight:  80.9 kg  BMI:  Body mass index is 35.57 kg/m.  Estimated Nutritional Needs:  Kcal:  2400-2700 kcal/d Protein:  130-150g/d Fluid:  1L+UOP   Edwena Graham, RD, LDN Registered Dietitian II Please reach out via secure chat

## 2023-08-26 NOTE — Plan of Care (Signed)
  Problem: Education: Goal: Knowledge of General Education information will improve Description: Including pain rating scale, medication(s)/side effects and non-pharmacologic comfort measures Outcome: Progressing   Problem: Health Behavior/Discharge Planning: Goal: Ability to manage health-related needs will improve Outcome: Progressing   Problem: Clinical Measurements: Goal: Ability to maintain clinical measurements within normal limits will improve Outcome: Progressing Goal: Will remain free from infection Outcome: Progressing Goal: Diagnostic test results will improve Outcome: Progressing Goal: Respiratory complications will improve Outcome: Progressing Goal: Cardiovascular complication will be avoided Outcome: Progressing   Problem: Activity: Goal: Risk for activity intolerance will decrease Outcome: Progressing   Problem: Nutrition: Goal: Adequate nutrition will be maintained Outcome: Progressing   Problem: Coping: Goal: Level of anxiety will decrease Outcome: Progressing   Problem: Elimination: Goal: Will not experience complications related to bowel motility Outcome: Progressing Goal: Will not experience complications related to urinary retention Outcome: Progressing   Problem: Pain Managment: Goal: General experience of comfort will improve and/or be controlled Outcome: Progressing   Problem: Safety: Goal: Ability to remain free from injury will improve Outcome: Progressing   Problem: Skin Integrity: Goal: Risk for impaired skin integrity will decrease Outcome: Progressing   Problem: Education: Goal: Ability to describe self-care measures that may prevent or decrease complications (Diabetes Survival Skills Education) will improve Outcome: Progressing Goal: Individualized Educational Video(s) Outcome: Progressing   Problem: Coping: Goal: Ability to adjust to condition or change in health will improve Outcome: Progressing   Problem: Fluid  Volume: Goal: Ability to maintain a balanced intake and output will improve Outcome: Progressing   Problem: Health Behavior/Discharge Planning: Goal: Ability to identify and utilize available resources and services will improve Outcome: Progressing Goal: Ability to manage health-related needs will improve Outcome: Progressing   Problem: Metabolic: Goal: Ability to maintain appropriate glucose levels will improve Outcome: Progressing   Problem: Nutritional: Goal: Maintenance of adequate nutrition will improve Outcome: Progressing Goal: Progress toward achieving an optimal weight will improve Outcome: Progressing   Problem: Skin Integrity: Goal: Risk for impaired skin integrity will decrease Outcome: Progressing   Problem: Tissue Perfusion: Goal: Adequacy of tissue perfusion will improve Outcome: Progressing   Problem: Education: Goal: Knowledge about tracheostomy care/management will improve Outcome: Progressing   Problem: Activity: Goal: Ability to tolerate increased activity will improve Outcome: Progressing   Problem: Health Behavior/Discharge Planning: Goal: Ability to manage tracheostomy will improve Outcome: Progressing   Problem: Respiratory: Goal: Patent airway maintenance will improve Outcome: Progressing   Problem: Role Relationship: Goal: Ability to communicate will improve Outcome: Progressing   Problem: Education: Goal: Knowledge of disease and its progression will improve Outcome: Progressing Goal: Individualized Educational Video(s) Outcome: Progressing   Problem: Fluid Volume: Goal: Compliance with measures to maintain balanced fluid volume will improve Outcome: Progressing   Problem: Health Behavior/Discharge Planning: Goal: Ability to manage health-related needs will improve Outcome: Progressing   Problem: Nutritional: Goal: Ability to make healthy dietary choices will improve Outcome: Progressing   Problem: Clinical  Measurements: Goal: Complications related to the disease process, condition or treatment will be avoided or minimized Outcome: Progressing

## 2023-08-26 NOTE — Procedures (Signed)
 Seen and examined on dialysis.  Blood pressure 101/89 and HR 99.  Tolerating goal.  Court Distance tunn catheter in use  Nan Aver, MD 08/26/2023  4:50 PM

## 2023-08-26 NOTE — Progress Notes (Signed)
Patient off the unit for HD

## 2023-08-26 NOTE — Consult Note (Signed)
 WOC consult requested for sacrum wound.   This was already performed yesterday and topical treatment orders have been provided for bedside nurses to perform. Refer to previous consult notes for assessment and measurements. WOC team will assess the location weekly to determine if a change in the plan of care is indicated at that time.  Thank-you,  Wiliam Harder MSN, RN, CWOCN, [place], CNS [phone]

## 2023-08-26 NOTE — Progress Notes (Signed)
 Physical Therapy Treatment Patient Details Name: Frank [name]. MRN: [id number] DOB: 08-22-1965 Today's Date: 08/26/2023   History of Present Illness 58 y.o. male transferred from [facility] 07/03/23 to [facility] for management of newly diagnosed plasma cell myeloma with weakness and AMS. Pt also with AKI and metabolic encephalopathy. 5/10 transfer to [facility] for iHD. 4/22 Intubated and chemo initiated. 4/23-5/10 CRRT. 5/6 IVIG started. 5/8 trach. 5/12 iHD initiated. 5/18 inability to oxygenate through trach, intubated and trach revision. 5/29 PEG placed PMHx:Lt THA, carpal tunnel syndrome, Lt TKA, Lt humerus fx, PAF, HTN, HLD, bipolar disorder, obesity, T2DM    PT Comments  Pt with fair tolerance to treatment today. Pt remains +2 Max/Total A for bed mobility however was able to sit EOB for ~5 minutes working on sitting balance. Pt fluctuated between Min-Max A for sitting balance. No change in DC/DME recs at this time. PT will continue to follow.     If plan is discharge home, recommend the following: Two people to help with walking and/or transfers;Assistance with cooking/housework;Assist for transportation;Two people to help with bathing/dressing/bathroom;Direct supervision/assist for medications management;Direct supervision/assist for financial management;Help with stairs or ramp for entrance;Assistance with feeding   Can travel by private vehicle     No  Equipment Recommendations  Hospital bed;Hoyer lift;Wheelchair (measurements PT);Wheelchair cushion (measurements PT);BSC/3in1    Recommendations for Other Services       Precautions / Restrictions Precautions Precautions: Fall Recall of Precautions/Restrictions: Impaired Precaution/Restrictions Comments: trach 28% 6L, flexiseal Restrictions Weight Bearing Restrictions Per Provider Order: Yes LUE Weight Bearing Per Provider Order: Non weight bearing Other Position/Activity Restrictions: Lt humerus non-union, splinted     Mobility   Bed Mobility Overal bed mobility: Needs Assistance Bed Mobility: Supine to Sit, Sit to Supine Rolling: Total assist   Supine to sit: +2 for physical assistance, Total assist, HOB elevated Sit to supine: +2 for physical assistance, Total assist, HOB elevated   General bed mobility comments: Verbal and tactile cues provided to move each leg off R EOB with pt assisting by flexing each leg to slide them laterally to the R. He did need assistance to direct each leg and complete transitioning them off R EOB though. Pt dependent on therapist to adjust his shoulders to pivot his body. Cues provided for pt to hold onto therapist with his R UE to pull up to sit, but pt unable to grip therapist and thereby required total assist to sit up and scoot to R EOB. Cues provided for pt to lean laterally to R elbow to descend trunk and lifts legs with return to supine, but pt needed total assist to complete.    Transfers                   General transfer comment: deferred to focus on strengthening and sitting balance at EOB this date    Ambulation/Gait               General Gait Details: Deferred at this time   Stairs             Wheelchair Mobility     Tilt Bed    Modified Rankin (Stroke Patients Only)       Balance Overall balance assessment: Needs assistance Sitting-balance support: Single extremity supported, No upper extremity supported, Feet supported Sitting balance-Leahy Scale: Poor Sitting balance - Comments: Pt tends to lean anteriorly, flexing at his trunk. Pt is having difficulty extending his neck and trunk enough to lean back. Verbal and  tactile cues provided to neck and back musculature to extend to sit more upright. Moments of success noted, with pt only needing modA at times but otherwise needing up to maxA for static sitting balance. Attempted various R hand placement on bed and bed rail, but no success. Best success was with R hand on his lap and pt was cued  to extend his R elbow to push himself back, but his R hand would slip off his lap intermittently. Sat EOB >5 min. Postural control: Right lateral lean, Posterior lean                                  Communication Communication Communication: Impaired Factors Affecting Communication: Trach/intubated  Cognition Arousal: Alert Behavior During Therapy: Flat affect   PT - Cognitive impairments: Difficult to assess Difficult to assess due to: Impaired communication, Tracheostomy                     PT - Cognition Comments: Pt attempted to mouth/state something intermittently, but unable to determine what it was, even with PMV donned. Slow to process and initiate following simple commands. Able to gesture needs with R UE or head movements. Following commands: Impaired Following commands impaired: Follows one step commands inconsistently, Follows one step commands with increased time    Cueing Cueing Techniques: Tactile cues, Visual cues, Verbal cues, Gestural cues  Exercises Other Exercises Other Exercises: Shoulder shrugs x2 Other Exercises: Lateral cervical flexion to L x3 Other Exercises: Dynamic reaching with RUE    General Comments General comments (skin integrity, edema, etc.): VSS      Pertinent Vitals/Pain Pain Assessment Pain Assessment: Faces Faces Pain Scale: Hurts little more Pain Location: Pt shaking his head "yes" when you asked if his LUE hurts. Pain Descriptors / Indicators: Grimacing Pain Intervention(s): Monitored during session, Limited activity within patient's tolerance, Repositioned    Home Living                          Prior Function            PT Goals (current goals can now be found in the care plan section) Progress towards PT goals: Progressing toward goals    Frequency    Min 1X/week      PT Plan      Co-evaluation              AM-PAC PT "6 Clicks" Mobility   Outcome Measure  Help needed  turning from your back to your side while in a flat bed without using bedrails?: Total Help needed moving from lying on your back to sitting on the side of a flat bed without using bedrails?: Total Help needed moving to and from a bed to a chair (including a wheelchair)?: Total Help needed standing up from a chair using your arms (e.g., wheelchair or bedside chair)?: Total Help needed to walk in hospital room?: Total Help needed climbing 3-5 steps with a railing? : Total 6 Click Score: 6    End of Session Equipment Utilized During Treatment: Oxygen;Other (comment) (L UE splint) Activity Tolerance: Patient limited by fatigue Patient left: in bed;with call bell/phone within reach;with bed alarm set;with SCD's reapplied;Other (comment) Nurse Communication: Mobility status PT Visit Diagnosis: Other abnormalities of gait and mobility (R26.89);Muscle weakness (generalized) (M62.81);Difficulty in walking, not elsewhere classified (R26.2);Unsteadiness on feet (R26.81)     Time:  4098-1191 PT Time Calculation (min) (ACUTE ONLY): 11 min  Charges:    $Therapeutic Activity: 8-22 mins PT General Charges $$ ACUTE PT VISIT: 1 Visit                     Aliea Bobe B, PT, DPT Acute Rehab Services [phone]    Lysha Schrade 08/26/2023, 2:23 PM

## 2023-08-26 NOTE — Progress Notes (Signed)
 Patient ID: Frank [name]., male   DOB: 1965-08-04, 58 y.o.   MRN: [id number] [facility] Progress Note   Assessment/ Plan:   1. Acute kidney Injury on CKD3a: secondary to myeloma kidney. Transitioned to IHD on MWF schedule after previously being on CRRT; dialysis has been a little challenging in the face of hypotension.   - MWF HD.  Have also ordered HD again today (Tuesday) for clearance.  Note predialysis administration of midodrine .   - He does not have any evidence of renal recovery and is not suitable for outpatient dialysis placement yet (inability to sit in recliner and limited facilities able to care for tracheostomy).  2. Acute metabolic encephalopathy: appears to wax and wane per charting  - optimize HD as above   3. Multiple myeloma: Closely followed by Dr. Maria Shiner and started on aggressive regimen with Sarclisa , Kyprolis  and Cytoxan  per charting   4. 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  Note plans to treat through 10/21/23 per ID note  5. Acute hypoxic respiratory failure - with tracheal collar - pulm following  6. Anemia: from acute illness and multiple myeloma. ESA per oncology    7. Bacteremia - noted staph epidermidis from 6/7 blood cultures.   - Repeat blood cultures on 6/9 are NGTD  - abx per primary team and ID - currently getting vanc   Disposition - continue inpatient monitoring     Subjective:    Strict ins/outs are not available.  He has had 650 mL UOP charted over 6/10 thus far.  There is no urine output charted for 6/9.   They capped his trach today per nursing report.  He is sleepy but nods and shakes head   Review of systems:     Limited by trach but does nod and shake head to some questions Denies air hunger Denies n/v      Objective:   BP 103/82 (BP Location: Right Arm)   Pulse 99   Temp 98.1 F (36.7 C) (Oral)   Resp 17   Ht 6' 0.01" (1.829 m)   Wt 119 kg   SpO2 99%   BMI  35.57 kg/m   Intake/Output Summary (Last 24 hours) at 08/26/2023 1322 Last data filed at 08/26/2023 0800 Gross per 24 hour  Intake 345 ml  Output 2150 ml  Net -1805 ml   Weight change:   Physical Exam:     General adult male in bed in no acute distress HEENT normocephalic atraumatic extraocular movements intact sclera anicteric Neck supple trach in place, capped Lungs clear to auscultation bilaterally normal work of breathing at rest  Heart S1S2 no rub  Abdomen soft nontender nondistended Extremities no edema  Psych no anxiety or agitation  Neuro sleepy; nods or shakes head to answer basic questions  Access RIJ Tunneled dialysis catheter    Imaging: DG Swallowing Func-Speech Pathology Result Date: 08/25/2023 Table formatting from the original result was not included. Images from the original result were not included. Modified Barium Swallow Study Patient Details Name: Frank [name]. MRN: [id number] Date of Birth: Jun 03, 1965 Today's Date: 08/25/2023 HPI/PMH: HPI: 58 y.o. male transferred from [facility] 07/03/23 to [facility] for management of newly diagnosed plasma cell myeloma with weakness and AMS. Pt also with AKI and metabolic encephalopathy. 5/10 transfer to [facility] for iHD.Bedside swallow eval 4/18 wiht recs for NPO due to somnolence. 4/22 Intubated and chemo initiated. 4/23-5/10 CRRT. 5/6 IVIG started. 5/8 trach. 5/12 iHD initiated.  5/18 inability to oxygenate through trach, intubated and trach revision. PMHx:Lt THA, carpal tunnel syndrome, Lt TKA, Lt humerus fx, PAF, HTN, HLD, bipolar disorder, obesity, T2DM Clinical Impression: Pt demonstrates a moderate oropharyngeal dysphagia with a DIGEST score of 2. Pt evaluated with PMSV in place, pt able to follow about 50% of commands to cough or throat clear, requires total assisted feeding. Is not initiating well, very flat affect. Pt initiates swallows in the pyriform sinuses with spillage of bolus into the vestible with sensed aspiration of thin  liquids and silent aspiraiton of nectar thick liquids in only the final attempt. There is base of tongue and vallecular residue that penetrated the vestibule without pt initiation to clear.  Suspect base of tongue weakness, but also noted very bumpy irregular tissue on the velar tissue that would be contributing to residue (see screen shot below). He does respond occasionally  to verbal cues for a throat clear and second swallow. Pt to remain NPO, but would benefit from ice chips throughout the day and frequent trials of nectar thick liquids after oral care with SLP. DIGEST Swallow Severity Rating*  Safety: 2  Efficiency:2  Overall Pharyngeal Swallow Severity: 2 1: mild; 2: moderate; 3: severe; 4: profound *The Dynamic Imaging Grade of Swallowing Toxicity is standardized for the head and neck cancer population, however, demonstrates promising clinical applications across populations to standardize the clinical rating of pharyngeal swallow safety and severity. Factors that may increase risk of adverse event in presence of aspiration Roderick Civatte & Jessy Morocco 2021): Factors that may increase risk of adverse event in presence of aspiration Roderick Civatte & Jessy Morocco 2021): Reduced cognitive function; Frail or deconditioned; Dependence for feeding and/or oral hygiene; Inadequate oral hygiene; Weak cough; Limited mobility; Poor general health and/or compromised immunity Recommendations/Plan: Swallowing Evaluation Recommendations Swallowing Evaluation Recommendations Recommendations: Ice chips PRN after oral care Medication Administration: Via alternative means Oral care recommendations: Oral care before ice chips/water  Treatment Plan Treatment Plan Treatment recommendations: Therapy as outlined in treatment plan below Recommendations Recommendations for follow up therapy are one component of a multi-disciplinary discharge planning process, led by the attending physician.  Recommendations may be updated based on patient status, additional  functional criteria and insurance authorization. Assessment: Orofacial Exam: Orofacial Exam Oral Cavity - Dentition: Poor condition; Missing dentition Anatomy: Anatomy: WFL Boluses Administered: Boluses Administered Boluses Administered: Mildly thick liquids (Level 2, nectar thick); Thin liquids (Level 0); Moderately thick liquids (Level 3, honey thick); Puree  Oral Impairment Domain: Oral Impairment Domain Lip Closure: No labial escape Tongue control during bolus hold: Not tested Bolus preparation/mastication: -- (NT) Bolus transport/lingual motion: Repetitive/disorganized tongue motion Oral residue: Trace residue lining oral structures Location of oral residue : Tongue Initiation of pharyngeal swallow : Pyriform sinuses  Pharyngeal Impairment Domain: Pharyngeal Impairment Domain Soft palate elevation: No bolus between soft palate (SP)/pharyngeal wall (PW) Laryngeal elevation: Complete superior movement of thyroid cartilage with complete approximation of arytenoids to epiglottic petiole Anterior hyoid excursion: Complete anterior movement Epiglottic movement: Complete inversion Laryngeal vestibule closure: Incomplete, narrow column air/contrast in laryngeal vestibule Pharyngeal stripping wave : Present - complete Pharyngoesophageal segment opening: Complete distension and complete duration, no obstruction of flow Tongue base retraction: Trace column of contrast or air between tongue base and PPW Pharyngeal residue: Collection of residue within or on pharyngeal structures Location of pharyngeal residue: Tongue base; Valleculae; Pyriform sinuses  Esophageal Impairment Domain: No data recorded Pill: No data recorded Penetration/Aspiration Scale Score: Penetration/Aspiration Scale Score 1.  Material does not enter airway: Mildly thick  liquids (Level 2, nectar thick) 3.  Material enters airway, remains ABOVE vocal cords and not ejected out: Moderately thick liquids (Level 3, honey thick); Mildly thick liquids (Level 2,  nectar thick); Puree 8.  Material enters airway, passes BELOW cords without attempt by patient to eject out (silent aspiration) : Mildly thick liquids (Level 2, nectar thick) Compensatory Strategies: No data recorded  General Information: Caregiver present: No  Diet Prior to this Study: NPO; Cortrak/Small bore NG tube   Temperature : Normal   Respiratory Status: WFL   Supplemental O2: Trach Collar   History of Recent Intubation: No  Behavior/Cognition: Requires cueing; Alert Self-Feeding Abilities: Dependent for feeding Baseline vocal quality/speech: Not observed Volitional Cough: Able to elicit Volitional Swallow: Able to elicit No data recorded Goal Planning: Prognosis for improved oropharyngeal function: Good Barriers to Reach Goals: Cognitive deficits No data recorded Patient/Family Stated Goal: not able to state No data recorded Pain: Pain Assessment Breathing: 0 Negative Vocalization: 0 Facial Expression: 0 Body Language: 0 Consolability: 0 PAINAD Score: 0 End of Session: Start Time:SLP Start Time (ACUTE ONLY): 1400 Stop Time: SLP Stop Time (ACUTE ONLY): 1415 Time Calculation:SLP Time Calculation (min) (ACUTE ONLY): 15 min Charges: SLP Evaluations $ SLP Speech Visit: 1 Visit SLP Evaluations $MBS Swallow: 1 Procedure $Swallowing Treatment: 1 Procedure $Speech Treatment for Individual: 1 Procedure SLP visit diagnosis: SLP Visit Diagnosis: Dysphagia, oropharyngeal phase (R13.12) Past Medical History: Past Medical History: Diagnosis Date  Anxiety   Arthritis   Asthma   Bipolar disorder (HCC)   Current every day smoker   Depression   Diabetes mellitus, type II (HCC)   Dyspnea   History of kidney stones   History of pneumonia   Hyperlipidemia   Hypertension   Morbid obesity (HCC)   Sedentary lifestyle   Seizures (HCC)   last seizure 12 yrs ago, no current problem  Sleep apnea   uses CPAP nightly Past Surgical History: Past Surgical History: Procedure Laterality Date  CARDIAC CATHETERIZATION  2020  carpal tunel Left    COLONOSCOPY    HARDWARE REMOVAL Left 07/26/2022  Procedure: HARDWARE REMOVAL ELBOW;  Surgeon: Laneta Pintos, MD;  Location: [facility] OR;  Service: Orthopedics;  Laterality: Left;  IR FLUORO GUIDE CV LINE RIGHT  08/07/2023  IR GASTROSTOMY TUBE MOD SED  08/14/2023  IR US  GUIDE VASC ACCESS RIGHT  08/07/2023  ORIF HUMERUS FRACTURE Left 01/16/2022  Procedure: OPEN REDUCTION INTERNAL FIXATION (ORIF) DISTAL HUMERUS FRACTURE;  Surgeon: Laneta Pintos, MD;  Location: [facility] OR;  Service: Orthopedics;  Laterality: Left;  ORIF HUMERUS FRACTURE Left 01/29/2023  Procedure: OPEN REDUCTION INTERNAL FIXATION (ORIF) DISTAL HUMERUS FRACTURE;  Surgeon: Laneta Pintos, MD;  Location: [facility] OR;  Service: Orthopedics;  Laterality: Left;  OTHER SURGICAL HISTORY    R & L shoulder  OTHER SURGICAL HISTORY Right   Knee surgery x several  TOTAL HIP ARTHROPLASTY Left 12/26/2017  Procedure: LEFT TOTAL HIP ARTHROPLASTY ANTERIOR APPROACH;  Surgeon: Arnie Lao, MD;  Location: [facility];  Service: Orthopedics;  Laterality: Left;  TOTAL KNEE ARTHROPLASTY Left 11/23/2021  Procedure: LEFT TOTAL KNEE ARTHROPLASTY;  Surgeon: Arnie Lao, MD;  Location: [facility];  Service: Orthopedics;  Laterality: Left; Valeri Gate 08/25/2023, 3:02 PM    Labs: BMET Recent Labs  Lab 08/20/23 0527 08/21/23 0603 08/22/23 0324 08/23/23 0353 08/24/23 0744 08/25/23 0419 08/26/23 0433  NA 132* 130* 130* 131* 131* 130* 131*  K 4.6 4.4 4.4 4.3 4.4 4.3 3.8  CL 93* 92* 94*  94* 93* 93* 95*  CO2 25 23 20* 24 22 21* 24  GLUCOSE 115* 131* 131* 100* 86 234* 107*  BUN 155* 96* 143* 90* 147* 174* 89*  CREATININE 3.53* 2.91* 3.80* 2.75* 3.57* 3.58* 2.07*  CALCIUM  8.5* 8.4* 8.1* 7.7* 8.0* 8.2* 8.3*  PHOS  --  5.6* 6.2* 4.4 5.7* 5.4* 3.4   CBC Recent Labs  Lab 08/23/23 0353 08/24/23 0744 08/25/23 0419 08/26/23 0433  WBC 3.3* 3.6* 3.5* 3.2*  NEUTROABS 2.2 2.7 2.2 2.2  HGB 9.9* 10.3* 9.5* 9.0*  HCT 31.8* 32.0* 30.2* 28.1*  MCV 105.0*  103.6* 105.6* 104.5*  PLT 173 154 130* 110*    Medications:     acyclovir   200 mg Per Tube BID   arformoterol   15 mcg Nebulization BID   collagenase    Topical Daily   feeding supplement (NEPRO CARB STEADY)  355 mL Per Tube TID PC & HS   feeding supplement (PROSource TF20)  60 mL Per Tube Daily   fiber  1 packet Per Tube BID   heparin  injection (subcutaneous)  5,000 Units Subcutaneous Q8H   insulin  aspart  0-15 Units Subcutaneous TID AC & HS   insulin  aspart  2 Units Subcutaneous TID AC & HS   insulin  glargine-yfgn  10 Units Subcutaneous BID   midodrine   10 mg Per Tube Q M,W,F-HD   montelukast   5 mg Per Tube QHS   multivitamin  1 tablet Per Tube QHS   nicotine   7 mg Transdermal Daily   nutrition supplement (JUVEN)  1 packet Per Tube BID BM   mouth rinse  15 mL Mouth Rinse 4 times per day   PARoxetine   40 mg Per Tube Daily   pneumococcal 20-valent conjugate vaccine  0.5 mL Intramuscular Tomorrow-1000   revefenacin   175 mcg Nebulization Daily   sodium chloride  flush  10-40 mL Intracatheter Q12H   voriconazole   350 mg Per Tube Q12H    Nan Aver, MD 08/26/2023, 1:40 PM

## 2023-08-26 NOTE — Plan of Care (Signed)
  Problem: Clinical Measurements: Goal: Respiratory complications will improve Outcome: Progressing Goal: Cardiovascular complication will be avoided Outcome: Progressing   Problem: Activity: Goal: Risk for activity intolerance will decrease Outcome: Progressing   Problem: Nutrition: Goal: Adequate nutrition will be maintained Outcome: Progressing   Problem: Elimination: Goal: Will not experience complications related to urinary retention Outcome: Progressing   Problem: Fluid Volume: Goal: Ability to maintain a balanced intake and output will improve Outcome: Progressing   Problem: Metabolic: Goal: Ability to maintain appropriate glucose levels will improve Outcome: Progressing   Problem: Tissue Perfusion: Goal: Adequacy of tissue perfusion will improve Outcome: Progressing   Problem: Respiratory: Goal: Patent airway maintenance will improve Outcome: Progressing

## 2023-08-26 NOTE — Progress Notes (Addendum)
 [facility]  Date of Admission:  07/03/2023      Total days of antibiotics   Voriconazole  5/5 >> c   Vancomycin  6/05 >> c  Acyclovir  prophy        ASSESSMENT: Frank [name]. is a 58 y.o. male admitted with:   Multiple Myeloma on Chemotherapy -  Fevers of Undetermined Significance -  Unstageable wound to sacrum - Multiple Pressure Wounds -  ?Secondary infection -  Previously high fevers through all abx/antifungals. HD Line removed 5/20 and replaced for ongoing HD needs. Fevers better after resuming fever 5/30 up until 3d ago. MRSE in blood but in one anaerobic bottle could be contaminant. Wound is open but not purulent in drainage appearance today.  - Continue vancomycin   - Wound care per WOC recommendations  MRSE Bacteremia 1/4 bottles - Suspect could be contaminant, though he has HD line in place in Right internal jugular and immunosuppressed risk.  Fevers resolved with vancomycin . I think we will need to commit him to a course of IV vancomycin  to cover possibility of line related bacteremia.    Renal Failure -  On hemodialysis   Aspergillous Pneumonia -  Continues on voriconazole  treatment for planned 3 months (Started 5/5)  Respiratory Failure s/p Trach -  PCCM following with plans to start capping trials .     PLAN: Continue vancomycin   Continue voriconazole  (tentative end of therapy 8/5) Repeat blood cultures pending 6/9 NGTD    Principal Problem:   Multiple myeloma not having achieved remission (HCC) Active Problems:   Generalized weakness   Atrial fibrillation with rapid ventricular response (HCC)   AKI (acute kidney injury) (HCC)   Acute hypoxemic respiratory failure (HCC)   PAF (paroxysmal atrial fibrillation) (HCC)   On mechanically assisted ventilation (HCC)   Acute metabolic encephalopathy   Lobar pneumonia, unspecified organism (HCC)   Multiple myeloma without remission (HCC)   Protein-calorie  malnutrition, severe   Malfunction of tracheostomy stoma (HCC)   Fever   Aspergillosis, unspecified (HCC)   Goals of care, counseling/discussion   Hematoma of right thigh   Seizure disorder (HCC)   DMII (diabetes mellitus, type 2) (HCC)    acyclovir   200 mg Per Tube BID   arformoterol   15 mcg Nebulization BID   collagenase    Topical Daily   feeding supplement (NEPRO CARB STEADY)  355 mL Per Tube TID PC & HS   feeding supplement (PROSource TF20)  60 mL Per Tube Daily   fiber  1 packet Per Tube BID   heparin  injection (subcutaneous)  5,000 Units Subcutaneous Q8H   insulin  aspart  0-15 Units Subcutaneous TID AC & HS   insulin  aspart  2 Units Subcutaneous TID AC & HS   insulin  glargine-yfgn  10 Units Subcutaneous BID   midodrine   10 mg Per Tube Q M,W,F-HD   montelukast   5 mg Per Tube QHS   multivitamin  1 tablet Per Tube QHS   nicotine   7 mg Transdermal Daily   nutrition supplement (JUVEN)  1 packet Per Tube BID BM   mouth rinse  15 mL Mouth Rinse 4 times per day   PARoxetine   40 mg Per Tube Daily   pneumococcal 20-valent conjugate vaccine  0.5 mL Intramuscular Tomorrow-1000   revefenacin   175 mcg Nebulization Daily   sodium chloride  flush  10-40 mL Intracatheter Q12H   voriconazole   350 mg Per Tube Q12H    SUBJECTIVE: No new  complaints, sleepy today   Review of Systems: Review of Systems  Constitutional:  Negative for chills and fever.  Gastrointestinal:  Negative for abdominal pain, diarrhea, nausea and vomiting.    Allergies  Allergen Reactions   Morphine And Codeine Anaphylaxis   Shellfish Allergy Anaphylaxis   Betadine  [Povidone Iodine ] Itching   Bupropion Other (See Comments)    Shaking of the body, hallucinations    Chlorhexidine  Hives    Patient reports never had issues CHG with mouth rinse.   Influenza Vaccines Hives   Metoprolol  Rash    OBJECTIVE: Vitals:   08/26/23 1405 08/26/23 1415 08/26/23 1430 08/26/23 1500  BP: 102/79 106/80 94/81 99/83   Pulse:  98 95 95 95  Resp: 16 17 16 19   Temp: (!) 96.9 F (36.1 C)     TempSrc:      SpO2: 95% 100% 100% 100%  Height:       Body mass index is 35.57 kg/m.  Physical Exam Constitutional:      Appearance: Normal appearance. He is not ill-appearing.  HENT:     Head: Normocephalic.     Mouth/Throat:     Mouth: Mucous membranes are moist.     Pharynx: Oropharynx is clear.  Eyes:     General: No scleral icterus. Cardiovascular:     Rate and Rhythm: Normal rate.  Pulmonary:     Effort: Pulmonary effort is normal.  Chest:     Comments: Chest HD line non tender, clean occlusive dressing Musculoskeletal:        General: Normal range of motion.     Cervical back: Normal range of motion.  Skin:    Coloration: Skin is not cyanotic, jaundiced or pale.     Comments: Sacral wound assessed. Open areas without purulence. Some erythema to periwound bed.   Neurological:     Mental Status: He is alert and oriented to person, place, and time.  Psychiatric:        Mood and Affect: Mood normal.        Judgment: Judgment normal.     Lab Results Lab Results  Component Value Date   WBC 3.2 (L) 08/26/2023   HGB 9.0 (L) 08/26/2023   HCT 28.1 (L) 08/26/2023   MCV 104.5 (H) 08/26/2023   PLT 110 (L) 08/26/2023    Lab Results  Component Value Date   CREATININE 2.07 (H) 08/26/2023   BUN 89 (H) 08/26/2023   NA 131 (L) 08/26/2023   K 3.8 08/26/2023   CL 95 (L) 08/26/2023   CO2 24 08/26/2023    Lab Results  Component Value Date   ALT 18 08/20/2023   AST 20 08/20/2023   ALKPHOS 41 08/20/2023   BILITOT 0.6 08/20/2023     Microbiology: Recent Results (from the past 240 hours)  Urine Culture     Status: None   Collection Time: 08/23/23  6:18 AM   Specimen: Urine, Random  Result Value Ref Range Status   Specimen Description URINE, RANDOM  Final   Special Requests NONE Reflexed from [id number]  Final   Culture   Final    NO GROWTH Performed at [facility], [address]  [place], [place] [address]    Report Status 08/24/2023 FINAL  Final  Culture, blood (Routine X 2) w Reflex to ID Panel     Status: None (Preliminary result)   Collection Time: 08/23/23  9:35 AM   Specimen: BLOOD LEFT HAND  Result Value Ref Range Status   Specimen  Description BLOOD LEFT HAND  Final   Special Requests   Final    BOTTLES DRAWN AEROBIC ONLY Blood Culture adequate volume   Culture   Final    NO GROWTH 3 DAYS Performed at [facility], [address], [place], [place] [address]    Report Status PENDING  Incomplete  Culture, blood (Routine X 2) w Reflex to ID Panel     Status: Abnormal   Collection Time: 08/23/23  9:35 AM   Specimen: BLOOD RIGHT ARM  Result Value Ref Range Status   Specimen Description BLOOD RIGHT ARM  Final   Special Requests   Final    BOTTLES DRAWN AEROBIC AND ANAEROBIC Blood Culture adequate volume   Culture  Setup Time   Final    GRAM POSITIVE COCCI IN TETRADS AEROBIC BOTTLE ONLY CRITICAL RESULT CALLED TO, READ BACK BY AND VERIFIED WITH: PHARMD T. DANG [id number] AT 1330 BY EC    Culture (A)  Final    STAPHYLOCOCCUS EPIDERMIDIS THE SIGNIFICANCE OF ISOLATING THIS ORGANISM FROM A SINGLE SET OF BLOOD CULTURES WHEN MULTIPLE SETS ARE DRAWN IS UNCERTAIN. PLEASE NOTIFY THE MICROBIOLOGY DEPARTMENT WITHIN ONE WEEK IF SPECIATION AND SENSITIVITIES ARE REQUIRED. Performed at [facility], [address], [place], [place] [address]    Report Status 08/25/2023 FINAL  Final  Blood Culture ID Panel (Reflexed)     Status: Abnormal   Collection Time: 08/23/23  9:35 AM  Result Value Ref Range Status   Enterococcus faecalis NOT DETECTED NOT DETECTED Final   Enterococcus Faecium NOT DETECTED NOT DETECTED Final   Listeria monocytogenes NOT DETECTED NOT DETECTED Final   Staphylococcus species DETECTED (A) NOT DETECTED Final    Comment: CRITICAL RESULT CALLED TO, READ BACK BY AND VERIFIED WITH: PHARMD T. DANG [id number] AT 1330 BY EC    Staphylococcus aureus  (BCID) NOT DETECTED NOT DETECTED Final   Staphylococcus epidermidis DETECTED (A) NOT DETECTED Final    Comment: Methicillin (oxacillin) resistant coagulase negative staphylococcus. Possible blood culture contaminant (unless isolated from more than one blood culture draw or clinical case suggests pathogenicity). No antibiotic treatment is indicated for blood  culture contaminants. CRITICAL RESULT CALLED TO, READ BACK BY AND VERIFIED WITH: PHARMD T. DANG [id number] AT 1330 BY EC    Staphylococcus lugdunensis NOT DETECTED NOT DETECTED Final   Streptococcus species NOT DETECTED NOT DETECTED Final   Streptococcus agalactiae NOT DETECTED NOT DETECTED Final   Streptococcus pneumoniae NOT DETECTED NOT DETECTED Final   Streptococcus pyogenes NOT DETECTED NOT DETECTED Final   A.calcoaceticus-baumannii NOT DETECTED NOT DETECTED Final   Bacteroides fragilis NOT DETECTED NOT DETECTED Final   Enterobacterales NOT DETECTED NOT DETECTED Final   Enterobacter cloacae complex NOT DETECTED NOT DETECTED Final   Escherichia coli NOT DETECTED NOT DETECTED Final   Klebsiella aerogenes NOT DETECTED NOT DETECTED Final   Klebsiella oxytoca NOT DETECTED NOT DETECTED Final   Klebsiella pneumoniae NOT DETECTED NOT DETECTED Final   Proteus species NOT DETECTED NOT DETECTED Final   Salmonella species NOT DETECTED NOT DETECTED Final   Serratia marcescens NOT DETECTED NOT DETECTED Final   Haemophilus influenzae NOT DETECTED NOT DETECTED Final   Neisseria meningitidis NOT DETECTED NOT DETECTED Final   Pseudomonas aeruginosa NOT DETECTED NOT DETECTED Final   Stenotrophomonas maltophilia NOT DETECTED NOT DETECTED Final   Candida albicans NOT DETECTED NOT DETECTED Final   Candida auris NOT DETECTED NOT DETECTED Final   Candida glabrata NOT DETECTED NOT DETECTED Final   Candida  krusei NOT DETECTED NOT DETECTED Final   Candida parapsilosis NOT DETECTED NOT DETECTED Final   Candida tropicalis NOT DETECTED NOT DETECTED Final    Cryptococcus neoformans/gattii NOT DETECTED NOT DETECTED Final   Methicillin resistance mecA/C DETECTED (A) NOT DETECTED Final    Comment: CRITICAL RESULT CALLED TO, READ BACK BY AND VERIFIED WITH: PHARMD T. DANG [id number] AT 1330 BY EC Performed at [facility], [address] [place], [place] [address]   Culture, blood (Routine X 2) w Reflex to ID Panel     Status: None (Preliminary result)   Collection Time: 08/25/23 11:09 AM   Specimen: BLOOD RIGHT ARM  Result Value Ref Range Status   Specimen Description BLOOD RIGHT ARM  Final   Special Requests   Final    BOTTLES DRAWN AEROBIC ONLY Blood Culture adequate volume   Culture   Final    NO GROWTH < 24 HOURS Performed at [facility], [address], [place], [place] [address]    Report Status PENDING  Incomplete  Culture, blood (Routine X 2) w Reflex to ID Panel     Status: None (Preliminary result)   Collection Time: 08/25/23 11:10 AM   Specimen: BLOOD LEFT HAND  Result Value Ref Range Status   Specimen Description BLOOD LEFT HAND  Final   Special Requests   Final    BOTTLES DRAWN AEROBIC ONLY Blood Culture results may not be optimal due to an inadequate volume of blood received in culture bottles   Culture   Final    NO GROWTH < 24 HOURS Performed at [facility], [address], [place], [place] [address]    Report Status PENDING  Incomplete    Gibson Kurtz, MSN, NP-C [facility] Pager: [phone]  @TODAY @ 3:20 PM

## 2023-08-27 ENCOUNTER — Encounter (HOSPITAL_COMMUNITY): Payer: Self-pay | Admitting: Hematology & Oncology

## 2023-08-27 DIAGNOSIS — B957 Other staphylococcus as the cause of diseases classified elsewhere: Secondary | ICD-10-CM | POA: Diagnosis not present

## 2023-08-27 DIAGNOSIS — C9000 Multiple myeloma not having achieved remission: Secondary | ICD-10-CM | POA: Diagnosis not present

## 2023-08-27 DIAGNOSIS — L89150 Pressure ulcer of sacral region, unstageable: Secondary | ICD-10-CM | POA: Diagnosis not present

## 2023-08-27 DIAGNOSIS — R7881 Bacteremia: Secondary | ICD-10-CM | POA: Diagnosis not present

## 2023-08-27 LAB — GLUCOSE, CAPILLARY
Glucose-Capillary: 111 mg/dL — ABNORMAL HIGH (ref 70–99)
Glucose-Capillary: 149 mg/dL — ABNORMAL HIGH (ref 70–99)
Glucose-Capillary: 197 mg/dL — ABNORMAL HIGH (ref 70–99)
Glucose-Capillary: 217 mg/dL — ABNORMAL HIGH (ref 70–99)
Glucose-Capillary: 95 mg/dL (ref 70–99)

## 2023-08-27 LAB — CBC WITH DIFFERENTIAL/PLATELET
Abs Immature Granulocytes: 0.03 10*3/uL (ref 0.00–0.07)
Basophils Absolute: 0 10*3/uL (ref 0.0–0.1)
Basophils Relative: 0 %
Eosinophils Absolute: 0 10*3/uL (ref 0.0–0.5)
Eosinophils Relative: 0 %
HCT: 28 % — ABNORMAL LOW (ref 39.0–52.0)
Hemoglobin: 9 g/dL — ABNORMAL LOW (ref 13.0–17.0)
Immature Granulocytes: 1 %
Lymphocytes Relative: 17 %
Lymphs Abs: 0.5 10*3/uL — ABNORMAL LOW (ref 0.7–4.0)
MCH: 33.2 pg (ref 26.0–34.0)
MCHC: 32.1 g/dL (ref 30.0–36.0)
MCV: 103.3 fL — ABNORMAL HIGH (ref 80.0–100.0)
Monocytes Absolute: 0.3 10*3/uL (ref 0.1–1.0)
Monocytes Relative: 9 %
Neutro Abs: 2.1 10*3/uL (ref 1.7–7.7)
Neutrophils Relative %: 73 %
Platelets: 106 10*3/uL — ABNORMAL LOW (ref 150–400)
RBC: 2.71 MIL/uL — ABNORMAL LOW (ref 4.22–5.81)
RDW: 22.4 % — ABNORMAL HIGH (ref 11.5–15.5)
Smear Review: NORMAL
WBC: 2.8 10*3/uL — ABNORMAL LOW (ref 4.0–10.5)
nRBC: 0.7 % — ABNORMAL HIGH (ref 0.0–0.2)

## 2023-08-27 LAB — RENAL FUNCTION PANEL
Albumin: 2.3 g/dL — ABNORMAL LOW (ref 3.5–5.0)
Anion gap: 8 (ref 5–15)
BUN: 65 mg/dL — ABNORMAL HIGH (ref 6–20)
CO2: 27 mmol/L (ref 22–32)
Calcium: 8.6 mg/dL — ABNORMAL LOW (ref 8.9–10.3)
Chloride: 96 mmol/L — ABNORMAL LOW (ref 98–111)
Creatinine, Ser: 1.74 mg/dL — ABNORMAL HIGH (ref 0.61–1.24)
GFR, Estimated: 45 mL/min — ABNORMAL LOW (ref >60)
Glucose, Bld: 96 mg/dL (ref 70–99)
Phosphorus: 3.2 mg/dL (ref 2.5–4.6)
Potassium: 3.3 mmol/L — ABNORMAL LOW (ref 3.5–5.1)
Sodium: 131 mmol/L — ABNORMAL LOW (ref 135–145)

## 2023-08-27 LAB — MAGNESIUM: Magnesium: 2.3 mg/dL (ref 1.7–2.4)

## 2023-08-27 MED ORDER — HEPARIN SODIUM (PORCINE) 1000 UNIT/ML IJ SOLN
INTRAMUSCULAR | Status: AC
Start: 2023-08-27 — End: 2023-08-27
  Filled 2023-08-27: qty 4

## 2023-08-27 MED ORDER — LIDOCAINE-PRILOCAINE 2.5-2.5 % EX CREA: 1.0000 | TOPICAL_CREAM | CUTANEOUS | Status: DC | PRN

## 2023-08-27 MED ORDER — HEPARIN SODIUM (PORCINE) 1000 UNIT/ML IJ SOLN
3800.0000 [IU] | Freq: Once | INTRAMUSCULAR | Status: AC
  Administered 2023-08-27: 3800 [IU]

## 2023-08-27 MED ORDER — LIDOCAINE HCL (PF) 1 % IJ SOLN
5.0000 mL | INTRAMUSCULAR | Status: DC | PRN
Start: 2023-08-27 — End: 2023-08-27

## 2023-08-27 MED ORDER — VANCOMYCIN HCL IN DEXTROSE 1-5 GM/200ML-% IV SOLN
1000.0000 mg | Freq: Once | INTRAVENOUS | Status: AC
  Administered 2023-08-27: 1000 mg via INTRAVENOUS
  Filled 2023-08-27: qty 200

## 2023-08-27 MED ORDER — HEPARIN SODIUM (PORCINE) 1000 UNIT/ML DIALYSIS: 1000.0000 [IU] | INTRAMUSCULAR | Status: DC | PRN

## 2023-08-27 MED ORDER — ALTEPLASE 2 MG IJ SOLR
2.0000 mg | Freq: Once | INTRAMUSCULAR | Status: DC | PRN
Start: 2023-08-27 — End: 2023-08-27

## 2023-08-27 MED ORDER — PENTAFLUOROPROP-TETRAFLUOROETH EX AERO: 1.0000 | INHALATION_SPRAY | CUTANEOUS | Status: DC | PRN

## 2023-08-27 MED ORDER — ANTICOAGULANT SODIUM CITRATE 4% (200MG/5ML) IV SOLN: 5.0000 mL | Status: DC | PRN

## 2023-08-27 NOTE — Progress Notes (Signed)
 Overall, I really think that [name] is doing okay.  He definitely seems to acknowledge what I am saying to him.  Hopefully, he will be able to talk to us  at some point.  He still is on dialysis.  He still gets the tube feeds.  I am sure that speech pathology will work with him to try to get him to swallow.  His labs show white count 2.8.  Hemoglobin 9.  Platelet count 106,000.  His ANC is 2.1.  The sodium 131.  Potassium 3.3.  BUN 65 creatinine 1.74.  He will be due for treatment tomorrow.  At some point, we may have to think about some kind of central line for him.  I really do not think he has a lot of good peripheral access.  I know he has a dialysis catheter in.  Again, it be nice if he would be able to get off dialysis at some point.  I really have to believe that this protocol is going to help him.  He has not had any temperatures for a while.  I think he is tolerating the tube feeds okay.  Again it would be nice if at some point, he would be able to eat.   His vital signs are temperature 98.  Pulse 110.  Blood pressure 120/90.  His lungs sound relatively clear bilaterally.  Cardiac exam is regular rate and rhythm.  Abdomen is soft.  Bowel sounds are present.  He has no fluid wave.  Extremity shows a brace on his left arm.  There is no swelling in his legs.  Neurological exam shows no focal neurological deficits.  Again, Mr. Belger has myeloma.  He has undergone IgG kappa myeloma.  He is on a more aggressive protocol secondary to his high risk cytogenetics.  I do not know if he needs to be seen by Orthopedic Surgery for the fracture in the left arm.  I am sure what needs to be done with this brace.  I know that Nephrology is following him very closely with dialysis.  Again, we may have to think about some kind of central line for him once he is in outpatient.  I know he is getting incredible care from everybody upon [address].  I do appreciate everybody's help.  [name], MD  [place]  10:36

## 2023-08-27 NOTE — Progress Notes (Signed)
 Pharmacy Antibiotic Note  Frank Boody. is a 58 y.o. male admitted on 07/03/2023 with generalized weakness (multifactorial secondary to hypercalcemia, suspected malignancy, multiple myeloma). 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 Pharmacy now consulted for vancomycin  dosing for intermittent fever and new BCID results showing MRSE. AKI on CKD - patient required CRRT from 4/23-5/10 >> transitioned to [facility] MWF.   Pt had an additional session of HD yesterday. We will give an extra dose of vanc today to count as a reload of vanc.   Plan: Extra vancomycin  1000mg  (total of 2g) IV today  Continue vancomycin  1000 mg IV MWF qHD Continue Voriconazole  350 mg per tube Q12H  Continue Acyclovir  200 mg per tube BID  Follow up cultures, fever curve, clinical status   Height: 6' 0.01 (182.9 cm) Weight: 127 kg (279 lb 15.8 oz) (air matress) IBW/kg (Calculated) : 77.62  Temp (24hrs), Avg:98.5 F (36.9 C), Min:96.9 F (36.1 C), Max:100.3 F (37.9 C)  Recent Labs  Lab 08/23/23 0353 08/24/23 0744 08/25/23 0419 08/26/23 0433 08/27/23 0442  WBC 3.3* 3.6* 3.5* 3.2* 2.8*  CREATININE 2.75* 3.57* 3.58* 2.07* 1.74*    Estimated Creatinine Clearance: 63 mL/min (A) (by C-G formula based on SCr of 1.74 mg/dL (H)).    Allergies  Allergen Reactions   Morphine And Codeine Anaphylaxis   Shellfish Allergy Anaphylaxis   Betadine  [Povidone Iodine ] Itching   Bupropion Other (See Comments)    Shaking of the body, hallucinations    Chlorhexidine  Hives    Patient reports never had issues CHG with mouth rinse.   Influenza Vaccines Hives   Metoprolol  Rash    Antimicrobials this admission: 4/17 R/Z >> 4/22 Zosyn  >> 5/1 Meropenem  >> 5/6 Levaquin  >> 5/12 4/28 Vanco >> 5/5 4/18 Fluconazole  >> 4/23 5/4 Voriconazole  >> 5/6>> reinitiated 5/10 >> - 08/01/23 1.9 mcg/ml on 350 mg/12h - 08/08/23 0.4 mcg/ml on 350 mg/12h - 08/19/22 1.3 mcg/ml >> continue 350 mg/12h 5/10 Unasyn  >> 5/11   5/11 vancomycin  >> 5/16 Acyclovir  ppx 5/24 >> Vancomycin  6/8 >>  Microbiology results: 4/19: MRSA PCR: neg 4/19 UCx: ngF 4/19 BCx: ngF 4/23 TA: few Staph epi: MRSE 4/28 BCx: ngF 4/30 TA: rare aspergillus fumigatus 5/2 CSF: ngtd 5/4 TA: rare mold, aspergillus [place]  5/5 TA PJP stain: neg 5/5 MRSA PCR: neg 5/5 BCx: NGTD 5/9 tracheal asp: abundant staph haemolyticus + mold, aspergillus [place]  5/11 BCx >> ngtd 5/15 TA: sent 6/7 UCx: ng 6/7 BCx: MRSE (1 of 3 bottles) 6/9 blood>>ngtd   Ivery Marking, PharmD, [place], AAHIVP, CPP Infectious Disease Pharmacist 08/27/2023 9:13 AM

## 2023-08-27 NOTE — Progress Notes (Signed)
 08/27/2023  Seen in f/u for trach management.  S: Tolerated capping since 6/9 Denies SOB Cough is good Secretion burden managable Still cannot get him to talk  O:    08/27/2023    7:25 AM 08/27/2023    3:00 AM 08/27/2023    2:52 AM  Vitals with BMI  Systolic 118    Diastolic 91    Pulse 105 110 112   Chronically ill Weak but okay cough Minimal secretions Capped trach in place  A: Multiple myeloma on chemo Complex encephalopathy improving Renal failure on HD Multiple pressure ulcers  P: Remove trach, occlusive dressing Pressure to site with talking or coughing if able Stoma should close within 3 weeks, if not can refer to ENT Will be available as needed   Dan Vergie Zahm MD Pulmonary Critical Care Medicine Secure[employer] if during day (7a-7p) [phone] if after hours (7p-7a)

## 2023-08-27 NOTE — TOC Progression Note (Signed)
 Transition of Care (TOC) - Progression Note    Patient Details  Name: Frank [name]. MRN: [id number] Date of Birth: April 02, 1965  Transition of Care [facility] CM/SW Contact  Jonathan Neighbor, RN Phone Number: 08/27/2023, 3:50 PM  Clinical Narrative:     [name] out. Pt to receive dose of chemo tomorrow per oncology note.  Needs SNF placement. TOC following.  Expected Discharge Plan: Long Term Acute Care (LTAC) Barriers to Discharge: Continued Medical Work up  Expected Discharge Plan and Services In-house Referral: Clinical Social Work Discharge Planning Services: CM Consult Post Acute Care Choice: Skilled Nursing Facility Living arrangements for the past 2 months: Single Family Home                 DME Arranged: N/A DME Agency: NA       HH Arranged: NA HH Agency: NA         Social Determinants of Health (SDOH) Interventions SDOH Screenings   Food Insecurity: No Food Insecurity (07/05/2023)  Housing: Low Risk  (07/05/2023)  Transportation Needs: No Transportation Needs (07/05/2023)  Utilities: Not At Risk (07/05/2023)  Tobacco Use: High Risk (07/10/2023)    Readmission Risk Interventions     No data to display

## 2023-08-27 NOTE — Plan of Care (Signed)
  Problem: Education: Goal: Knowledge of General Education information will improve Description: Including pain rating scale, medication(s)/side effects and non-pharmacologic comfort measures Outcome: Progressing   Problem: Health Behavior/Discharge Planning: Goal: Ability to manage health-related needs will improve Outcome: Progressing   Problem: Clinical Measurements: Goal: Ability to maintain clinical measurements within normal limits will improve Outcome: Progressing Goal: Will remain free from infection Outcome: Progressing Goal: Diagnostic test results will improve Outcome: Progressing Goal: Respiratory complications will improve Outcome: Progressing Goal: Cardiovascular complication will be avoided Outcome: Progressing   Problem: Activity: Goal: Risk for activity intolerance will decrease Outcome: Progressing   Problem: Nutrition: Goal: Adequate nutrition will be maintained Outcome: Progressing   Problem: Coping: Goal: Level of anxiety will decrease Outcome: Progressing   Problem: Elimination: Goal: Will not experience complications related to bowel motility Outcome: Progressing Goal: Will not experience complications related to urinary retention Outcome: Progressing   Problem: Pain Managment: Goal: General experience of comfort will improve and/or be controlled Outcome: Progressing   Problem: Safety: Goal: Ability to remain free from injury will improve Outcome: Progressing   Problem: Skin Integrity: Goal: Risk for impaired skin integrity will decrease Outcome: Progressing   Problem: Education: Goal: Ability to describe self-care measures that may prevent or decrease complications (Diabetes Survival Skills Education) will improve Outcome: Progressing Goal: Individualized Educational Video(s) Outcome: Progressing   Problem: Coping: Goal: Ability to adjust to condition or change in health will improve Outcome: Progressing   Problem: Fluid  Volume: Goal: Ability to maintain a balanced intake and output will improve Outcome: Progressing   Problem: Health Behavior/Discharge Planning: Goal: Ability to identify and utilize available resources and services will improve Outcome: Progressing Goal: Ability to manage health-related needs will improve Outcome: Progressing   Problem: Metabolic: Goal: Ability to maintain appropriate glucose levels will improve Outcome: Progressing   Problem: Nutritional: Goal: Maintenance of adequate nutrition will improve Outcome: Progressing Goal: Progress toward achieving an optimal weight will improve Outcome: Progressing   Problem: Skin Integrity: Goal: Risk for impaired skin integrity will decrease Outcome: Progressing   Problem: Tissue Perfusion: Goal: Adequacy of tissue perfusion will improve Outcome: Progressing   Problem: Education: Goal: Knowledge about tracheostomy care/management will improve Outcome: Progressing   Problem: Activity: Goal: Ability to tolerate increased activity will improve Outcome: Progressing   Problem: Health Behavior/Discharge Planning: Goal: Ability to manage tracheostomy will improve Outcome: Progressing   Problem: Respiratory: Goal: Patent airway maintenance will improve Outcome: Progressing   Problem: Role Relationship: Goal: Ability to communicate will improve Outcome: Progressing   Problem: Education: Goal: Knowledge of disease and its progression will improve Outcome: Progressing Goal: Individualized Educational Video(s) Outcome: Progressing   Problem: Fluid Volume: Goal: Compliance with measures to maintain balanced fluid volume will improve Outcome: Progressing   Problem: Health Behavior/Discharge Planning: Goal: Ability to manage health-related needs will improve Outcome: Progressing   Problem: Nutritional: Goal: Ability to make healthy dietary choices will improve Outcome: Progressing   Problem: Clinical  Measurements: Goal: Complications related to the disease process, condition or treatment will be avoided or minimized Outcome: Progressing

## 2023-08-27 NOTE — Progress Notes (Signed)
 [facility]    Date of Admission:  07/03/2023      ID: Antoinette Batman. is a 58 y.o. male with   Principal Problem:   Multiple myeloma not having achieved remission (HCC) Active Problems:   Generalized weakness   Atrial fibrillation with rapid ventricular response (HCC)   AKI (acute kidney injury) (HCC)   Acute hypoxemic respiratory failure (HCC)   PAF (paroxysmal atrial fibrillation) (HCC)   On mechanically assisted ventilation (HCC)   Acute metabolic encephalopathy   Lobar pneumonia, unspecified organism (HCC)   Multiple myeloma without remission (HCC)   Protein-calorie malnutrition, severe   Malfunction of tracheostomy stoma (HCC)   Fever   Aspergillosis, unspecified (HCC)   Goals of care, counseling/discussion   Hematoma of right thigh   Seizure disorder (HCC)   DMII (diabetes mellitus, type 2) (HCC)    Subjective: Had low grade fever of 100.71F this morning. He was decannulated this morning. Still has soft voice.  Medications:   acyclovir   200 mg Per Tube BID   arformoterol   15 mcg Nebulization BID   collagenase    Topical Daily   feeding supplement (NEPRO CARB STEADY)  355 mL Per Tube TID PC & HS   feeding supplement (PROSource TF20)  60 mL Per Tube Daily   fiber  1 packet Per Tube BID   heparin  injection (subcutaneous)  5,000 Units Subcutaneous Q8H   insulin  aspart  0-15 Units Subcutaneous TID AC & HS   insulin  aspart  2 Units Subcutaneous TID AC & HS   insulin  glargine-yfgn  10 Units Subcutaneous BID   midodrine   10 mg Per Tube Q M,W,F-HD   montelukast   5 mg Per Tube QHS   multivitamin  1 tablet Per Tube QHS   nicotine   7 mg Transdermal Daily   nutrition supplement (JUVEN)  1 packet Per Tube BID BM   mouth rinse  15 mL Mouth Rinse 4 times per day   PARoxetine   40 mg Per Tube Daily   pneumococcal 20-valent conjugate vaccine  0.5 mL Intramuscular Tomorrow-1000   revefenacin   175 mcg Nebulization Daily   sodium chloride  flush   10-40 mL Intracatheter Q12H   voriconazole   350 mg Per Tube Q12H    Objective: Vital signs in last 24 hours: Temp:  [97.9 F (36.6 C)-100.3 F (37.9 C)] 98.8 F (37.1 C) (06/11 1553) Pulse Rate:  [95-112] 104 (06/11 1553) Resp:  [15-31] 18 (06/11 1553) BP: (101-122)/(78-107) 107/78 (06/11 1553) SpO2:  [92 %-100 %] 93 % (06/11 1553) Weight:  [161 kg-127 kg] 126 kg (06/11 1234) Physical Exam  Constitutional: He is oriented to person, place, . He appears chronically ill and malnourished. No distress.  HENT:  Mouth/Throat: Oropharynx is clear and moist. No oropharyngeal exudate.  Cardiovascular: Normal rate, regular rhythm and normal heart sounds. Exam reveals no gallop and no friction rub.  No murmur heard.  Pulmonary/Chest: Effort normal and breath sounds normal. No respiratory distress. He has no wheezes.  Abdominal: Soft. Bowel sounds are normal. He exhibits no distension. There is no tenderness.  Skin: Skin is warm and dry. No rash noted. No erythema.     Lab Results Recent Labs    08/26/23 0433 08/27/23 0442  WBC 3.2* 2.8*  HGB 9.0* 9.0*  HCT 28.1* 28.0*  NA 131* 131*  K 3.8 3.3*  CL 95* 96*  CO2 24 27  BUN 89* 65*  CREATININE 2.07* 1.74*   Liver Panel Recent Labs  08/26/23 0433 08/27/23 0442  ALBUMIN  2.4* 2.3*     Microbiology: reviewed Studies/Results: No results found.   Assessment/Plan: Staph epi bacteremia = cleared on repeat blood cx but has been on vancomycin . Concern that it maybe line related since he had repeat fevers, will recommend to remove line at next hd session for brief line holiday  Sacral decubitus ulcer = continue with vashe and medihoney to debride slough and off loading as much as possible  Aspergillus pneumonia = continue with voriconazole . Will check level at beginning of next week  Multiple myeloma = to receive next cycle starting tomorrow  evaluation of this patient requires complex antimicrobial therapy evaluation and  counseling and isolation needs for disease transmission risk assessment and mitigation.    [facility] for Infectious Diseases Pager: [phone]  08/27/2023, 5:12 PM

## 2023-08-27 NOTE — Progress Notes (Addendum)
 PROGRESS NOTE    Frank [name].  ZOX:[id number] DOB: 10/11/1965 DOA: 07/03/2023 PCP: Serita Danes, MD   Brief Narrative: 58 year old PMH OSA CPAP, pathological fracture and multiple ORIF left Humerus likely due to multiple myeloma, A-fib not on anticoagulation, hyperlipidemia, tobacco use disorder who was  feeling weak for 1 week, admitted  to [facility] health with concern of sepsis, community-acquired pneumonia and acute encephalopathy.  Patient was found to have lytic lesions,  he was transferred to [facility] for  oncology treatment.  In the meantime he developed encephalopathy, acute renal failure.  He remained in the ICU for 36 days.  Underwent tracheostomy. He was also started on HD after  CRRT.  Subsequently PEG tube placed 5/29.  Significant events 4/17-Admitted to medical floor with oncology consultation to treat multiple myeloma. 4/19-transferred to ICU intubated, encephalopathic. 4/20-A-fib with RVR.  Started heparin . Groin and biopsy site hematoma requiring multiple transfusions. EEG 4/25 >> moderate to severe encephalopathy without any seizures or epileptiform discharges. 4/29 Received Velcade  and Cytoxan  chemotherapy-intent palliative? 5/2 LP and ETT exchange  5/8 Trach placed. Some post op bleeding improved with thrombi-pad 5/10 voriconazole  added back based on CT Chest findings 5/13 more alert and able to follow some commands. 5/23, transferred to floor. 5/29-PEG placed 5/31 chemotherapy changed to Sarclisa /Kyprolis /Cytoxan .    Assessment & Plan:   Principal Problem:   Multiple myeloma not having achieved remission (HCC) Active Problems:   AKI (acute kidney injury) (HCC)   Acute hypoxemic respiratory failure (HCC)   Goals of care, counseling/discussion   Acute metabolic encephalopathy   Lobar pneumonia, unspecified organism (HCC)   Fever   Aspergillosis, unspecified (HCC)   Hematoma of right thigh   Generalized weakness   Atrial fibrillation with rapid  ventricular response (HCC)   PAF (paroxysmal atrial fibrillation) (HCC)   On mechanically assisted ventilation (HCC)   Multiple myeloma without remission (HCC)   Protein-calorie malnutrition, severe   Malfunction of tracheostomy stoma (HCC)   Seizure disorder (HCC)   DMII (diabetes mellitus, type 2) (HCC)  1-Recurrent fever, SIRS:  - Intermittent fevers.  ID is planning to treat him for Bacteremia.  - Blood cultures drawn 6/7 1 out of 3 bottles with Staph Epi  -ID planning on IV antibiotics, considering removing hemodialysis cath attempt for holiday line - Repeated blood culture 6/9: No growth to date - Unstageable sacral ulcer needs to be monitored as well; continue WOC rec's   Aspergillosis with pneumonia - on Voriconazole  per ID On Acyclovir   Multiple myeloma - He should be started on Velcade  and Cytoxan .  Chemotherapy changed to Sarclisa /Kyprolis /Cytoxan  on 5/31  -Dr Maria Shiner following.   Goals of care - Unfortunately poor long-term prognosis - Palliative care has discussed with wife previously.  Remain full code on full scope of care  Acute hypoxic respiratory failure: - Pulmonology following intermittently  -Trach removed 6/11  Right Thigh Hematoma -per CT on 08/01/23: 12.1 x 30.2 x 7.6 cm subcutaneous hematoma within the anterior right thigh. 8.4 x 10.4 x 22.2 cm subcutaneous hematoma within the right gluteal region - continue supportive care -hb stable.   Severe acute metabolic encephalopathy with hypoactive delirium, seizure disorder - Normal MRI of the brain.  LP negative for meningitis or encephalitis.  EEG negative for seizures but encephalopathy. - Home Depakote  discontinued in ICU.   Insulin -dependent diabetes type 2 with hyperglycemia - A1c 6.0 - Continue with sliding scale insulin   AKI on CKD stage IIIa, Uremia,  Hyponatremia, Hypokalemia -Due to  MM, hypercalcemia and ACE inhibitors?   Paroxysmal A-fib - Unable to  anticoagulation due to hematoma. Rate  controlled.   Obstructive Sleep Apnea:  -Trach removed  Anxiety/Bipolar disorder.  - Continue with  Paxil  and Depakote   Severe Malnutrition:  - Continue tube feeding  Hypokalemia; correction with HD.  Hyponatremia; correction with HD See wound care Documentation below.   Pressure Injury 07/05/23 Heel Left;Right Unstageable - Full thickness tissue loss in which the base of the injury is covered by slough (yellow, tan, gray, green or brown) and/or eschar (tan, brown or black) in the wound bed. (Active)  07/05/23 1108  Location: Heel  Location Orientation: Left;Right  Staging: Unstageable - Full thickness tissue loss in which the base of the injury is covered by slough (yellow, tan, gray, green or brown) and/or eschar (tan, brown or black) in the wound bed.  Wound Description (Comments):   Present on Admission: Yes  Dressing Type Foam - Lift dressing to assess site every shift 08/26/23 2202     Pressure Injury 07/11/23 Sacrum Mid Unstageable - Full thickness tissue loss in which the base of the injury is covered by slough (yellow, tan, gray, green or brown) and/or eschar (tan, brown or black) in the wound bed. (Active)  07/11/23 1600  Location: Sacrum  Location Orientation: Mid  Staging: Unstageable - Full thickness tissue loss in which the base of the injury is covered by slough (yellow, tan, gray, green or brown) and/or eschar (tan, brown or black) in the wound bed.  Wound Description (Comments):   Present on Admission: No  Dressing Type Foam - Lift dressing to assess site every shift 08/26/23 2202     Pressure Injury 07/27/23 Buttocks Right;Lower (Active)  07/27/23 1700  Location: Buttocks  Location Orientation: Right;Lower  Staging:   Wound Description (Comments):   Present on Admission: Yes  Dressing Type Foam - Lift dressing to assess site every shift 08/26/23 2202     Pressure Injury 08/25/23 Buttocks Left Stage 2 -  Partial thickness loss of dermis presenting as a  shallow open injury with a red, pink wound bed without slough. (Active)  08/25/23   Location: Buttocks  Location Orientation: Left  Staging: Stage 2 -  Partial thickness loss of dermis presenting as a shallow open injury with a red, pink wound bed without slough.  Wound Description (Comments):   Present on Admission: No  Dressing Type Foam - Lift dressing to assess site every shift 08/26/23 2202     Nutrition Problem: Severe Malnutrition Etiology: acute illness (prolonged illness, interuptions in feeding)    Signs/Symptoms: moderate fat depletion, moderate muscle depletion, percent weight loss (9.6% x 1 month) Percent weight loss: 9.6 %    Interventions: Prostat, Tube feeding, MVI  Estimated body mass index is 35.57 kg/m as calculated from the following:   Height as of this encounter: 6' 0.01 (1.829 m).   Weight as of this encounter: 119 kg.   DVT prophylaxis: Heparin  Code Status: Full code Family Communication: No family at bedside Disposition Plan:  Status is: Inpatient Remains inpatient appropriate because: Management of encephalopathy: Debilitated, hemodialysis, Needs to be able to seat for HD    Consultants:  Oncology CCM ID  Procedures:    Antimicrobials:  Voriconazole  08/05/2023 >> current Vancomycin  08/24/2023 >> current  Subjective: Alert, appears very weak deconditioned. Eyes open.   Objective: Vitals:   08/26/23 2319 08/27/23 0000 08/27/23 0252 08/27/23 0300  BP:      Pulse: (!) 109 (!) 110 [name])  112 (!) 110  Resp: 18 18 18 18   Temp:  98.5 F (36.9 C)  98 F (36.7 C)  TempSrc:  Axillary  Axillary  SpO2: 100% 99%  100%  Height:        Intake/Output Summary (Last 24 hours) at 08/27/2023 1610 Last data filed at 08/26/2023 1750 Gross per 24 hour  Intake --  Output 1300 ml  Net -1300 ml   Filed Weights    Examination:  General exam: Appears calm and comfortable  Respiratory system: Clear to auscultation. Respiratory effort normal., trach  site coer with clean gauze.  Cardiovascular system: S1 & S2 heard, RRR. Gastrointestinal system: Abdomen is nondistended, soft and nontender. No organomegaly or masses felt. Normal bowel sounds heard. Central nervous system: Alert  , very weak, generalized weakness.  Extremities: no edema   Data Reviewed: I have personally reviewed following labs and imaging studies  CBC: Recent Labs  Lab 08/23/23 0353 08/24/23 0744 08/25/23 0419 08/26/23 0433 08/27/23 0442  WBC 3.3* 3.6* 3.5* 3.2* 2.8*  NEUTROABS 2.2 2.7 2.2 2.2 2.1  HGB 9.9* 10.3* 9.5* 9.0* 9.0*  HCT 31.8* 32.0* 30.2* 28.1* 28.0*  MCV 105.0* 103.6* 105.6* 104.5* 103.3*  PLT 173 154 130* 110* 106*   Basic Metabolic Panel: Recent Labs  Lab 08/23/23 0353 08/24/23 0744 08/25/23 0419 08/26/23 0433 08/27/23 0442  NA 131* 131* 130* 131* 131*  K 4.3 4.4 4.3 3.8 3.3*  CL 94* 93* 93* 95* 96*  CO2 24 22 21* 24 27  GLUCOSE 100* 86 234* 107* 96  BUN 90* 147* 174* 89* 65*  CREATININE 2.75* 3.57* 3.58* 2.07* 1.74*  CALCIUM  7.7* 8.0* 8.2* 8.3* 8.6*  MG 2.7* 3.0* 2.9* 2.6* 2.3  PHOS 4.4 5.7* 5.4* 3.4 3.2   GFR: Estimated Creatinine Clearance: 60.9 mL/min (A) (by C-G formula based on SCr of 1.74 mg/dL (H)). Liver Function Tests: Recent Labs  Lab 08/23/23 0353 08/24/23 0744 08/25/23 0419 08/26/23 0433 08/27/23 0442  ALBUMIN  2.7* 2.6* 2.5* 2.4* 2.3*   No results for input(s): LIPASE, AMYLASE in the last 168 hours. No results for input(s): AMMONIA in the last 168 hours. Coagulation Profile: No results for input(s): INR, PROTIME in the last 168 hours. Cardiac Enzymes: No results for input(s): CKTOTAL, CKMB, CKMBINDEX, TROPONINI in the last 168 hours. BNP (last 3 results) No results for input(s): PROBNP in the last 8760 hours. HbA1C: No results for input(s): HGBA1C in the last 72 hours. CBG: Recent Labs  Lab 08/26/23 0711 08/26/23 1127 08/26/23 1931 08/27/23 0007 08/27/23 0328  GLUCAP 92 166*  114* 217* 111*   Lipid Profile: No results for input(s): CHOL, HDL, LDLCALC, TRIG, CHOLHDL, LDLDIRECT in the last 72 hours. Thyroid Function Tests: No results for input(s): TSH, T4TOTAL, FREET4, T3FREE, THYROIDAB in the last 72 hours. Anemia Panel: No results for input(s): VITAMINB12, FOLATE, FERRITIN, TIBC, IRON, RETICCTPCT in the last 72 hours. Sepsis Labs: No results for input(s): PROCALCITON, LATICACIDVEN in the last 168 hours.  Recent Results (from the past 240 hours)  Urine Culture     Status: None   Collection Time: 08/23/23  6:18 AM   Specimen: Urine, Random  Result Value Ref Range Status   Specimen Description URINE, RANDOM  Final   Special Requests NONE Reflexed from [id number]  Final   Culture   Final    NO GROWTH Performed at [facility], [address], [place], [place] [address]    Report Status 08/24/2023 FINAL  Final  Culture, blood (Routine  X 2) w Reflex to ID Panel     Status: None (Preliminary result)   Collection Time: 08/23/23  9:35 AM   Specimen: BLOOD LEFT HAND  Result Value Ref Range Status   Specimen Description BLOOD LEFT HAND  Final   Special Requests   Final    BOTTLES DRAWN AEROBIC ONLY Blood Culture adequate volume   Culture   Final    NO GROWTH 4 DAYS Performed at [facility], [address], [place], [place] [address]    Report Status PENDING  Incomplete  Culture, blood (Routine X 2) w Reflex to ID Panel     Status: Abnormal   Collection Time: 08/23/23  9:35 AM   Specimen: BLOOD RIGHT ARM  Result Value Ref Range Status   Specimen Description BLOOD RIGHT ARM  Final   Special Requests   Final    BOTTLES DRAWN AEROBIC AND ANAEROBIC Blood Culture adequate volume   Culture  Setup Time   Final    GRAM POSITIVE COCCI IN TETRADS AEROBIC BOTTLE ONLY CRITICAL RESULT CALLED TO, READ BACK BY AND VERIFIED WITH: PHARMD T. DANG [id number] AT 1330 BY EC    Culture (A)  Final    STAPHYLOCOCCUS  EPIDERMIDIS THE SIGNIFICANCE OF ISOLATING THIS ORGANISM FROM A SINGLE SET OF BLOOD CULTURES WHEN MULTIPLE SETS ARE DRAWN IS UNCERTAIN. PLEASE NOTIFY THE MICROBIOLOGY DEPARTMENT WITHIN ONE WEEK IF SPECIATION AND SENSITIVITIES ARE REQUIRED. Performed at [facility], [address] [place], [place] [address]    Report Status 08/25/2023 FINAL  Final  Blood Culture ID Panel (Reflexed)     Status: Abnormal   Collection Time: 08/23/23  9:35 AM  Result Value Ref Range Status   Enterococcus faecalis NOT DETECTED NOT DETECTED Final   Enterococcus Faecium NOT DETECTED NOT DETECTED Final   Listeria monocytogenes NOT DETECTED NOT DETECTED Final   Staphylococcus species DETECTED (A) NOT DETECTED Final    Comment: CRITICAL RESULT CALLED TO, READ BACK BY AND VERIFIED WITH: PHARMD T. DANG [id number] AT 1330 BY EC    Staphylococcus aureus (BCID) NOT DETECTED NOT DETECTED Final   Staphylococcus epidermidis DETECTED (A) NOT DETECTED Final    Comment: Methicillin (oxacillin) resistant coagulase negative staphylococcus. Possible blood culture contaminant (unless isolated from more than one blood culture draw or clinical case suggests pathogenicity). No antibiotic treatment is indicated for blood  culture contaminants. CRITICAL RESULT CALLED TO, READ BACK BY AND VERIFIED WITH: PHARMD T. DANG [id number] AT 1330 BY EC    Staphylococcus lugdunensis NOT DETECTED NOT DETECTED Final   Streptococcus species NOT DETECTED NOT DETECTED Final   Streptococcus agalactiae NOT DETECTED NOT DETECTED Final   Streptococcus pneumoniae NOT DETECTED NOT DETECTED Final   Streptococcus pyogenes NOT DETECTED NOT DETECTED Final   A.calcoaceticus-baumannii NOT DETECTED NOT DETECTED Final   Bacteroides fragilis NOT DETECTED NOT DETECTED Final   Enterobacterales NOT DETECTED NOT DETECTED Final   Enterobacter cloacae complex NOT DETECTED NOT DETECTED Final   Escherichia coli NOT DETECTED NOT DETECTED Final   Klebsiella aerogenes  NOT DETECTED NOT DETECTED Final   Klebsiella oxytoca NOT DETECTED NOT DETECTED Final   Klebsiella pneumoniae NOT DETECTED NOT DETECTED Final   Proteus species NOT DETECTED NOT DETECTED Final   Salmonella species NOT DETECTED NOT DETECTED Final   Serratia marcescens NOT DETECTED NOT DETECTED Final   Haemophilus influenzae NOT DETECTED NOT DETECTED Final   Neisseria meningitidis NOT DETECTED NOT DETECTED Final   Pseudomonas aeruginosa NOT DETECTED NOT DETECTED Final  Stenotrophomonas maltophilia NOT DETECTED NOT DETECTED Final   Candida albicans NOT DETECTED NOT DETECTED Final   Candida auris NOT DETECTED NOT DETECTED Final   Candida glabrata NOT DETECTED NOT DETECTED Final   Candida krusei NOT DETECTED NOT DETECTED Final   Candida parapsilosis NOT DETECTED NOT DETECTED Final   Candida tropicalis NOT DETECTED NOT DETECTED Final   Cryptococcus neoformans/gattii NOT DETECTED NOT DETECTED Final   Methicillin resistance mecA/C DETECTED (A) NOT DETECTED Final    Comment: CRITICAL RESULT CALLED TO, READ BACK BY AND VERIFIED WITH: PHARMD T. DANG [id number] AT 1330 BY EC Performed at [facility], [address], [place], [place] [address]   Culture, blood (Routine X 2) w Reflex to ID Panel     Status: None (Preliminary result)   Collection Time: 08/25/23 11:09 AM   Specimen: BLOOD RIGHT ARM  Result Value Ref Range Status   Specimen Description BLOOD RIGHT ARM  Final   Special Requests   Final    BOTTLES DRAWN AEROBIC ONLY Blood Culture adequate volume   Culture   Final    NO GROWTH 2 DAYS Performed at [facility], [address], [place], [place] [address]    Report Status PENDING  Incomplete  Culture, blood (Routine X 2) w Reflex to ID Panel     Status: None (Preliminary result)   Collection Time: 08/25/23 11:10 AM   Specimen: BLOOD LEFT HAND  Result Value Ref Range Status   Specimen Description BLOOD LEFT HAND  Final   Special Requests   Final    BOTTLES DRAWN AEROBIC  ONLY Blood Culture results may not be optimal due to an inadequate volume of blood received in culture bottles   Culture   Final    NO GROWTH 2 DAYS Performed at [facility], [address], [place], [place] [address]    Report Status PENDING  Incomplete         Radiology Studies: DG Swallowing Func-Speech Pathology Result Date: 08/25/2023 Table formatting from the original result was not included. Images from the original result were not included. Modified Barium Swallow Study Patient Details Name: Shalin Vonbargen. MRN: [id number] Date of Birth: [birth year] Today's Date: 08/25/2023 HPI/PMH: HPI: 58 y.o. male transferred from [facility] 07/03/23 to [facility] for management of newly diagnosed plasma cell myeloma with weakness and AMS. Pt also with AKI and metabolic encephalopathy. 5/10 transfer to [facility] for iHD.Bedside swallow eval 4/18 wiht recs for NPO due to somnolence. 4/22 Intubated and chemo initiated. 4/23-5/10 CRRT. 5/6 IVIG started. 5/8 trach. 5/12 iHD initiated. 5/18 inability to oxygenate through trach, intubated and trach revision. PMHx:Lt THA, carpal tunnel syndrome, Lt TKA, Lt humerus fx, PAF, HTN, HLD, bipolar disorder, obesity, T2DM Clinical Impression: Pt demonstrates a moderate oropharyngeal dysphagia with a DIGEST score of 2. Pt evaluated with PMSV in place, pt able to follow about 50% of commands to cough or throat clear, requires total assisted feeding. Is not initiating well, very flat affect. Pt initiates swallows in the pyriform sinuses with spillage of bolus into the vestible with sensed aspiration of thin liquids and silent aspiraiton of nectar thick liquids in only the final attempt. There is base of tongue and vallecular residue that penetrated the vestibule without pt initiation to clear.  Suspect base of tongue weakness, but also noted very bumpy irregular tissue on the velar tissue that would be contributing to residue (see screen shot below). He does respond occasionally   to verbal  cues for a throat clear and second swallow. Pt to remain NPO, but would benefit from ice chips throughout the day and frequent trials of nectar thick liquids after oral care with SLP. DIGEST Swallow Severity Rating*  Safety: 2  Efficiency:2  Overall Pharyngeal Swallow Severity: 2 1: mild; 2: moderate; 3: severe; 4: profound *The Dynamic Imaging Grade of Swallowing Toxicity is standardized for the head and neck cancer population, however, demonstrates promising clinical applications across populations to standardize the clinical rating of pharyngeal swallow safety and severity. Factors that may increase risk of adverse event in presence of aspiration Roderick Civatte & Jessy Morocco 2021): Factors that may increase risk of adverse event in presence of aspiration Roderick Civatte & Jessy Morocco 2021): Reduced cognitive function; Frail or deconditioned; Dependence for feeding and/or oral hygiene; Inadequate oral hygiene; Weak cough; Limited mobility; Poor general health and/or compromised immunity Recommendations/Plan: Swallowing Evaluation Recommendations Swallowing Evaluation Recommendations Recommendations: Ice chips PRN after oral care Medication Administration: Via alternative means Oral care recommendations: Oral care before ice chips/water  Treatment Plan Treatment Plan Treatment recommendations: Therapy as outlined in treatment plan below Recommendations Recommendations for follow up therapy are one component of a multi-disciplinary discharge planning process, led by the attending physician.  Recommendations may be updated based on patient status, additional functional criteria and insurance authorization. Assessment: Orofacial Exam: Orofacial Exam Oral Cavity - Dentition: Poor condition; Missing dentition Anatomy: Anatomy: WFL Boluses Administered: Boluses Administered Boluses Administered: Mildly thick liquids (Level 2, nectar thick); Thin liquids (Level 0); Moderately thick liquids (Level 3, honey thick); Puree  Oral Impairment  Domain: Oral Impairment Domain Lip Closure: No labial escape Tongue control during bolus hold: Not tested Bolus preparation/mastication: -- (NT) Bolus transport/lingual motion: Repetitive/disorganized tongue motion Oral residue: Trace residue lining oral structures Location of oral residue : Tongue Initiation of pharyngeal swallow : Pyriform sinuses  Pharyngeal Impairment Domain: Pharyngeal Impairment Domain Soft palate elevation: No bolus between soft palate (SP)/pharyngeal wall (PW) Laryngeal elevation: Complete superior movement of thyroid cartilage with complete approximation of arytenoids to epiglottic petiole Anterior hyoid excursion: Complete anterior movement Epiglottic movement: Complete inversion Laryngeal vestibule closure: Incomplete, narrow column air/contrast in laryngeal vestibule Pharyngeal stripping wave : Present - complete Pharyngoesophageal segment opening: Complete distension and complete duration, no obstruction of flow Tongue base retraction: Trace column of contrast or air between tongue base and PPW Pharyngeal residue: Collection of residue within or on pharyngeal structures Location of pharyngeal residue: Tongue base; Valleculae; Pyriform sinuses  Esophageal Impairment Domain: No data recorded Pill: No data recorded Penetration/Aspiration Scale Score: Penetration/Aspiration Scale Score 1.  Material does not enter airway: Mildly thick liquids (Level 2, nectar thick) 3.  Material enters airway, remains ABOVE vocal cords and not ejected out: Moderately thick liquids (Level 3, honey thick); Mildly thick liquids (Level 2, nectar thick); Puree 8.  Material enters airway, passes BELOW cords without attempt by patient to eject out (silent aspiration) : Mildly thick liquids (Level 2, nectar thick) Compensatory Strategies: No data recorded  General Information: Caregiver present: No  Diet Prior to this Study: NPO; Cortrak/Small bore NG tube   Temperature : Normal   Respiratory Status: WFL    Supplemental O2: Trach Collar   History of Recent Intubation: No  Behavior/Cognition: Requires cueing; Alert Self-Feeding Abilities: Dependent for feeding Baseline vocal quality/speech: Not observed Volitional Cough: Able to elicit Volitional Swallow: Able to elicit No data recorded Goal Planning: Prognosis for improved oropharyngeal function: Good Barriers to Reach Goals: Cognitive deficits No data recorded Patient/Family Stated Goal: not able to  state No data recorded Pain: Pain Assessment Breathing: 0 Negative Vocalization: 0 Facial Expression: 0 Body Language: 0 Consolability: 0 PAINAD Score: 0 End of Session: Start Time:SLP Start Time (ACUTE ONLY): 1400 Stop Time: SLP Stop Time (ACUTE ONLY): 1415 Time Calculation:SLP Time Calculation (min) (ACUTE ONLY): 15 min Charges: SLP Evaluations $ SLP Speech Visit: 1 Visit SLP Evaluations $MBS Swallow: 1 Procedure $Swallowing Treatment: 1 Procedure $Speech Treatment for Individual: 1 Procedure SLP visit diagnosis: SLP Visit Diagnosis: Dysphagia, oropharyngeal phase (R13.12) Past Medical History: Past Medical History: Diagnosis Date  Anxiety   Arthritis   Asthma   Bipolar disorder (HCC)   Current every day smoker   Depression   Diabetes mellitus, type II (HCC)   Dyspnea   History of kidney stones   History of pneumonia   Hyperlipidemia   Hypertension   Morbid obesity (HCC)   Sedentary lifestyle   Seizures (HCC)   last seizure 12 yrs ago, no current problem  Sleep apnea   uses CPAP nightly Past Surgical History: Past Surgical History: Procedure Laterality Date  CARDIAC CATHETERIZATION  2020  carpal tunel Left   COLONOSCOPY    HARDWARE REMOVAL Left 07/26/2022  Procedure: HARDWARE REMOVAL ELBOW;  Surgeon: Laneta Pintos, MD;  Location: [facility] OR;  Service: Orthopedics;  Laterality: Left;  IR FLUORO GUIDE CV LINE RIGHT  08/07/2023  IR GASTROSTOMY TUBE MOD SED  08/14/2023  IR US  GUIDE VASC ACCESS RIGHT  08/07/2023  ORIF HUMERUS FRACTURE Left 01/16/2022  Procedure: OPEN REDUCTION  INTERNAL FIXATION (ORIF) DISTAL HUMERUS FRACTURE;  Surgeon: Laneta Pintos, MD;  Location: [facility] OR;  Service: Orthopedics;  Laterality: Left;  ORIF HUMERUS FRACTURE Left 01/29/2023  Procedure: OPEN REDUCTION INTERNAL FIXATION (ORIF) DISTAL HUMERUS FRACTURE;  Surgeon: Laneta Pintos, MD;  Location: [facility] OR;  Service: Orthopedics;  Laterality: Left;  OTHER SURGICAL HISTORY    R & L shoulder  OTHER SURGICAL HISTORY Right   Knee surgery x several  TOTAL HIP ARTHROPLASTY Left 12/26/2017  Procedure: LEFT TOTAL HIP ARTHROPLASTY ANTERIOR APPROACH;  Surgeon: Arnie Lao, MD;  Location: [facility] ORS;  Service: Orthopedics;  Laterality: Left;  TOTAL KNEE ARTHROPLASTY Left 11/23/2021  Procedure: LEFT TOTAL KNEE ARTHROPLASTY;  Surgeon: Arnie Lao, MD;  Location: [facility];  Service: Orthopedics;  Laterality: Left; DeBlois, Hardin Leys 08/25/2023, 3:02 PM       Scheduled Meds:  acyclovir   200 mg Per Tube BID   arformoterol   15 mcg Nebulization BID   collagenase    Topical Daily   feeding supplement (NEPRO CARB STEADY)  355 mL Per Tube TID PC & HS   feeding supplement (PROSource TF20)  60 mL Per Tube Daily   fiber  1 packet Per Tube BID   heparin  injection (subcutaneous)  5,000 Units Subcutaneous Q8H   insulin  aspart  0-15 Units Subcutaneous TID AC & HS   insulin  aspart  2 Units Subcutaneous TID AC & HS   insulin  glargine-yfgn  10 Units Subcutaneous BID   midodrine   10 mg Per Tube Q M,W,F-HD   montelukast   5 mg Per Tube QHS   multivitamin  1 tablet Per Tube QHS   nicotine   7 mg Transdermal Daily   nutrition supplement (JUVEN)  1 packet Per Tube BID BM   mouth rinse  15 mL Mouth Rinse 4 times per day   PARoxetine   40 mg Per Tube Daily   pneumococcal 20-valent conjugate vaccine  0.5 mL Intramuscular Tomorrow-1000   revefenacin   175 mcg Nebulization Daily  sodium chloride  flush  10-40 mL Intracatheter Q12H   voriconazole   350 mg Per Tube Q12H   Continuous Infusions:  sodium chloride   Stopped (08/21/23 1753)   anticoagulant sodium citrate      valproate sodium  750 mg (08/26/23 2208)   vancomycin  1,000 mg (08/25/23 1722)     LOS: 55 days    Time spent: 35 minutes    Chandler Stofer A Shareese Macha, MD [facility]   If 7PM-7AM, please contact night-coverage [email or web address]  08/27/2023, 7:07 AM

## 2023-08-27 NOTE — Progress Notes (Signed)
 Patient ID: Frank Batman., male   DOB: Mar 04, 1966, 57 y.o.   MRN: [id number] [facility] Progress Note   Assessment/ Plan:    1. Acute kidney Injury on CKD3a: secondary to myeloma kidney. Transitioned to IHD on MWF schedule after previously being on CRRT; dialysis has been a little challenging in the face of hypotension.  Had extra HD on 6/10 to optimize clearance -------------------------------- - MWF HD.  Note predialysis administration of midodrine .   - urine output is a bit better.  Monitoring for renal recovery.  Not suitable for outpatient dialysis placement yet (inability to sit in recliner and limited facilities able to care for tracheostomy).  2. Acute metabolic encephalopathy: appears to wax and wane per charting  - optimize HD as above   3. Multiple myeloma: Closely followed by Dr. Maria Shiner and started on aggressive regimen with Sarclisa , Kyprolis  and Cytoxan  per charting   4. 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  Note plans to treat through 10/21/23 per ID note  5. Acute hypoxic respiratory failure - with trach capped - pulm following  6. Anemia: from acute illness and multiple myeloma. ESA per oncology    7. Bacteremia - noted staph epidermidis from 6/7 blood cultures.   - Repeat blood cultures on 6/9 are NGTD  - abx per primary team and ID - currently getting vanc   Disposition - continue inpatient monitoring     Subjective:    Seen and examined on dialysis.  Blood pressure 115/90 and HR 107.  Tolerating goal.  Procedure supervised.  RIJ tunn catheter in use.  He is more awake today on our interview.  Denies issues with HD.    He also got an additional HD treatment on 6/10 to optimize.  He had 800 mL UOP charted over 6/10   Review of systems:     Limited by trach but does nod and shake head to some questions Denies air hunger Denies n/v      Objective:   BP 120/88   Pulse (!) 104   Temp 100.3 F  (37.9 C) (Axillary)   Resp (!) 24   Ht 6' 0.01 (1.829 m)   Wt 127 kg Comment: air matress  SpO2 96%   BMI 37.96 kg/m   Intake/Output Summary (Last 24 hours) at 08/27/2023 1000 Last data filed at 08/26/2023 1750 Gross per 24 hour  Intake --  Output 650 ml  Net -650 ml   Weight change:   Physical Exam:     General adult male in bed in no acute distress HEENT normocephalic atraumatic extraocular movements intact sclera anicteric Neck supple trach in place, capped Lungs clear to auscultation bilaterally normal work of breathing at rest  Heart S1S2 no rub  Abdomen soft nontender nondistended Extremities no edema  Psych no anxiety or agitation  Neuro more alert today; nods or shakes head to answer basic questions  Access RIJ Tunneled dialysis catheter    Imaging: DG Swallowing Func-Speech Pathology Result Date: 08/25/2023 Table formatting from the original result was not included. Images from the original result were not included. Modified Barium Swallow Study Patient Details Name: Frank Votta. MRN: [id number] Date of Birth: 13-Dec-1965 Today's Date: 08/25/2023 HPI/PMH: HPI: 58 y.o. male transferred from [facility] 07/03/23 to [facility] for management of newly diagnosed plasma cell myeloma with weakness and AMS. Pt also with AKI and metabolic encephalopathy. 5/10 transfer to [facility] for iHD.Bedside swallow eval 4/18 wiht recs for NPO due  to somnolence. 4/22 Intubated and chemo initiated. 4/23-5/10 CRRT. 5/6 IVIG started. 5/8 trach. 5/12 iHD initiated. 5/18 inability to oxygenate through trach, intubated and trach revision. PMHx:Lt THA, carpal tunnel syndrome, Lt TKA, Lt humerus fx, PAF, HTN, HLD, bipolar disorder, obesity, T2DM Clinical Impression: Pt demonstrates a moderate oropharyngeal dysphagia with a DIGEST score of 2. Pt evaluated with PMSV in place, pt able to follow about 50% of commands to cough or throat clear, requires total assisted feeding. Is not initiating well, very flat affect. Pt  initiates swallows in the pyriform sinuses with spillage of bolus into the vestible with sensed aspiration of thin liquids and silent aspiraiton of nectar thick liquids in only the final attempt. There is base of tongue and vallecular residue that penetrated the vestibule without pt initiation to clear.  Suspect base of tongue weakness, but also noted very bumpy irregular tissue on the velar tissue that would be contributing to residue (see screen shot below). He does respond occasionally  to verbal cues for a throat clear and second swallow. Pt to remain NPO, but would benefit from ice chips throughout the day and frequent trials of nectar thick liquids after oral care with SLP. DIGEST Swallow Severity Rating*  Safety: 2  Efficiency:2  Overall Pharyngeal Swallow Severity: 2 1: mild; 2: moderate; 3: severe; 4: profound *The Dynamic Imaging Grade of Swallowing Toxicity is standardized for the head and neck cancer population, however, demonstrates promising clinical applications across populations to standardize the clinical rating of pharyngeal swallow safety and severity. Factors that may increase risk of adverse event in presence of aspiration Roderick Civatte & Jessy Morocco 2021): Factors that may increase risk of adverse event in presence of aspiration Roderick Civatte & Jessy Morocco 2021): Reduced cognitive function; Frail or deconditioned; Dependence for feeding and/or oral hygiene; Inadequate oral hygiene; Weak cough; Limited mobility; Poor general health and/or compromised immunity Recommendations/Plan: Swallowing Evaluation Recommendations Swallowing Evaluation Recommendations Recommendations: Ice chips PRN after oral care Medication Administration: Via alternative means Oral care recommendations: Oral care before ice chips/water  Treatment Plan Treatment Plan Treatment recommendations: Therapy as outlined in treatment plan below Recommendations Recommendations for follow up therapy are one component of a multi-disciplinary discharge  planning process, led by the attending physician.  Recommendations may be updated based on patient status, additional functional criteria and insurance authorization. Assessment: Orofacial Exam: Orofacial Exam Oral Cavity - Dentition: Poor condition; Missing dentition Anatomy: Anatomy: WFL Boluses Administered: Boluses Administered Boluses Administered: Mildly thick liquids (Level 2, nectar thick); Thin liquids (Level 0); Moderately thick liquids (Level 3, honey thick); Puree  Oral Impairment Domain: Oral Impairment Domain Lip Closure: No labial escape Tongue control during bolus hold: Not tested Bolus preparation/mastication: -- (NT) Bolus transport/lingual motion: Repetitive/disorganized tongue motion Oral residue: Trace residue lining oral structures Location of oral residue : Tongue Initiation of pharyngeal swallow : Pyriform sinuses  Pharyngeal Impairment Domain: Pharyngeal Impairment Domain Soft palate elevation: No bolus between soft palate (SP)/pharyngeal wall (PW) Laryngeal elevation: Complete superior movement of thyroid cartilage with complete approximation of arytenoids to epiglottic petiole Anterior hyoid excursion: Complete anterior movement Epiglottic movement: Complete inversion Laryngeal vestibule closure: Incomplete, narrow column air/contrast in laryngeal vestibule Pharyngeal stripping wave : Present - complete Pharyngoesophageal segment opening: Complete distension and complete duration, no obstruction of flow Tongue base retraction: Trace column of contrast or air between tongue base and PPW Pharyngeal residue: Collection of residue within or on pharyngeal structures Location of pharyngeal residue: Tongue base; Valleculae; Pyriform sinuses  Esophageal Impairment Domain: No data recorded Pill: No  data recorded Penetration/Aspiration Scale Score: Penetration/Aspiration Scale Score 1.  Material does not enter airway: Mildly thick liquids (Level 2, nectar thick) 3.  Material enters airway, remains  ABOVE vocal cords and not ejected out: Moderately thick liquids (Level 3, honey thick); Mildly thick liquids (Level 2, nectar thick); Puree 8.  Material enters airway, passes BELOW cords without attempt by patient to eject out (silent aspiration) : Mildly thick liquids (Level 2, nectar thick) Compensatory Strategies: No data recorded  General Information: Caregiver present: No  Diet Prior to this Study: NPO; Cortrak/Small bore NG tube   Temperature : Normal   Respiratory Status: WFL   Supplemental O2: Trach Collar   History of Recent Intubation: No  Behavior/Cognition: Requires cueing; Alert Self-Feeding Abilities: Dependent for feeding Baseline vocal quality/speech: Not observed Volitional Cough: Able to elicit Volitional Swallow: Able to elicit No data recorded Goal Planning: Prognosis for improved oropharyngeal function: Good Barriers to Reach Goals: Cognitive deficits No data recorded Patient/Family Stated Goal: not able to state No data recorded Pain: Pain Assessment Breathing: 0 Negative Vocalization: 0 Facial Expression: 0 Body Language: 0 Consolability: 0 PAINAD Score: 0 End of Session: Start Time:SLP Start Time (ACUTE ONLY): 1400 Stop Time: SLP Stop Time (ACUTE ONLY): 1415 Time Calculation:SLP Time Calculation (min) (ACUTE ONLY): 15 min Charges: SLP Evaluations $ SLP Speech Visit: 1 Visit SLP Evaluations $MBS Swallow: 1 Procedure $Swallowing Treatment: 1 Procedure $Speech Treatment for Individual: 1 Procedure SLP visit diagnosis: SLP Visit Diagnosis: Dysphagia, oropharyngeal phase (R13.12) Past Medical History: Past Medical History: Diagnosis Date  Anxiety   Arthritis   Asthma   Bipolar disorder (HCC)   Current every day smoker   Depression   Diabetes mellitus, type II (HCC)   Dyspnea   History of kidney stones   History of pneumonia   Hyperlipidemia   Hypertension   Morbid obesity (HCC)   Sedentary lifestyle   Seizures (HCC)   last seizure 12 yrs ago, no current problem  Sleep apnea   uses CPAP nightly  Past Surgical History: Past Surgical History: Procedure Laterality Date  CARDIAC CATHETERIZATION  2020  carpal tunel Left   COLONOSCOPY    HARDWARE REMOVAL Left 07/26/2022  Procedure: HARDWARE REMOVAL ELBOW;  Surgeon: Laneta Pintos, MD;  Location: [facility];  Service: Orthopedics;  Laterality: Left;  IR FLUORO GUIDE CV LINE RIGHT  08/07/2023  IR GASTROSTOMY TUBE MOD SED  08/14/2023  IR US  GUIDE VASC ACCESS RIGHT  08/07/2023  ORIF HUMERUS FRACTURE Left 01/16/2022  Procedure: OPEN REDUCTION INTERNAL FIXATION (ORIF) DISTAL HUMERUS FRACTURE;  Surgeon: Laneta Pintos, MD;  Location: [facility];  Service: Orthopedics;  Laterality: Left;  ORIF HUMERUS FRACTURE Left 01/29/2023  Procedure: OPEN REDUCTION INTERNAL FIXATION (ORIF) DISTAL HUMERUS FRACTURE;  Surgeon: Laneta Pintos, MD;  Location: [facility];  Service: Orthopedics;  Laterality: Left;  OTHER SURGICAL HISTORY    R & L shoulder  OTHER SURGICAL HISTORY Right   Knee surgery x several  TOTAL HIP ARTHROPLASTY Left 12/26/2017  Procedure: LEFT TOTAL HIP ARTHROPLASTY ANTERIOR APPROACH;  Surgeon: Arnie Lao, MD;  Location: [facility];  Service: Orthopedics;  Laterality: Left;  TOTAL KNEE ARTHROPLASTY Left 11/23/2021  Procedure: LEFT TOTAL KNEE ARTHROPLASTY;  Surgeon: Arnie Lao, MD;  Location: [facility];  Service: Orthopedics;  Laterality: Left; Valeri Gate 08/25/2023, 3:02 PM    Labs: BMET Recent Labs  Lab 08/21/23 0603 08/22/23 0981 08/23/23 0353 08/24/23 0744 08/25/23 0419 08/26/23 0433 08/27/23 0442  NA 130* 130* 131* 131*  130* 131* 131*  K 4.4 4.4 4.3 4.4 4.3 3.8 3.3*  CL 92* 94* 94* 93* 93* 95* 96*  CO2 23 20* 24 22 21* 24 27  GLUCOSE 131* 131* 100* 86 234* 107* 96  BUN 96* 143* 90* 147* 174* 89* 65*  CREATININE 2.91* 3.80* 2.75* 3.57* 3.58* 2.07* 1.74*  CALCIUM  8.4* 8.1* 7.7* 8.0* 8.2* 8.3* 8.6*  PHOS 5.6* 6.2* 4.4 5.7* 5.4* 3.4 3.2   CBC Recent Labs  Lab 08/24/23 0744 08/25/23 0419 08/26/23 0433 08/27/23 0442  WBC  3.6* 3.5* 3.2* 2.8*  NEUTROABS 2.7 2.2 2.2 2.1  HGB 10.3* 9.5* 9.0* 9.0*  HCT 32.0* 30.2* 28.1* 28.0*  MCV 103.6* 105.6* 104.5* 103.3*  PLT 154 130* 110* 106*    Medications:     acyclovir   200 mg Per Tube BID   arformoterol   15 mcg Nebulization BID   collagenase    Topical Daily   feeding supplement (NEPRO CARB STEADY)  355 mL Per Tube TID PC & HS   feeding supplement (PROSource TF20)  60 mL Per Tube Daily   fiber  1 packet Per Tube BID   heparin  injection (subcutaneous)  5,000 Units Subcutaneous Q8H   heparin  sodium (porcine)  3,800 Units Intracatheter Once   insulin  aspart  0-15 Units Subcutaneous TID AC & HS   insulin  aspart  2 Units Subcutaneous TID AC & HS   insulin  glargine-yfgn  10 Units Subcutaneous BID   midodrine   10 mg Per Tube Q M,W,F-HD   montelukast   5 mg Per Tube QHS   multivitamin  1 tablet Per Tube QHS   nicotine   7 mg Transdermal Daily   nutrition supplement (JUVEN)  1 packet Per Tube BID BM   mouth rinse  15 mL Mouth Rinse 4 times per day   PARoxetine   40 mg Per Tube Daily   pneumococcal 20-valent conjugate vaccine  0.5 mL Intramuscular Tomorrow-1000   revefenacin   175 mcg Nebulization Daily   sodium chloride  flush  10-40 mL Intracatheter Q12H   voriconazole   350 mg Per Tube Q12H    Nan Aver, MD 08/27/2023, 10:19 AM

## 2023-08-27 NOTE — Progress Notes (Signed)
 transport arrived to take pt to Dialysis.

## 2023-08-27 NOTE — Progress Notes (Signed)
 Trach removed per MD order. Stoma covered with sterile 2x2's and paper tape.

## 2023-08-27 NOTE — Progress Notes (Signed)
 Received patient in bed to unit.  Alert and oriented.  Informed consent signed and in chart.   TX duration:3.5 hours  Patient tolerated well.  Transported back to the room  Alert, without acute distress.  Hand-off given to patient's nurse.   Access used: R internal jugular HD cath Access issues: none  Total UF removed: 1.3L Medication(s) given: Vancomycin    08/27/23 1228  Vitals  Temp 99.8 F (37.7 C)  Temp Source Axillary  BP (!) 121/96  MAP (mmHg) 105  BP Location Right Arm  BP Method Automatic  Patient Position (if appropriate) Lying  Pulse Rate (!) 108  Pulse Rate Source Monitor  ECG Heart Rate (!) 107  Resp (!) 22  Oxygen Therapy  SpO2 93 %  O2 Device Room Air;Tracheostomy Collar  During Treatment Monitoring  Duration of HD Treatment -hour(s) 3.5 hour(s)  HD Safety Checks Performed Yes  Intra-Hemodialysis Comments Tx completed  Dialysis Fluid Bolus Normal Saline  Bolus Amount (mL) 300 mL  Post Treatment  Dialyzer Clearance Clear  Liters Processed 84  Fluid Removed (mL) 1300 mL  Tolerated HD Treatment Yes  Hemodialysis Catheter Right Internal jugular Double lumen Permanent (Tunneled)  Placement Date/Time: 08/07/23 1611   Serial / Lot #: [id number]  Expiration Date: 02/04/28  Time Out: Correct patient;Correct site;Correct procedure  Maximum sterile barrier precautions: Hand hygiene;Cap;Mask;Sterile gown;Sterile gloves;Large sterile ...  Site Condition No complications  Blue Lumen Status Heparin  locked;Dead end cap in place;Flushed  Red Lumen Status Flushed;Heparin  locked;Dead end cap in place  Purple Lumen Status N/A  Catheter fill solution Heparin  1000 units/ml  Catheter fill volume (Arterial) 1.9 cc  Catheter fill volume (Venous) 1.9  Dressing Type Transparent  Dressing Status Antimicrobial disc/dressing in place;Clean, Dry, Intact  Drainage Description None  Dressing Change Due 08/29/23  Post treatment catheter status Capped and Clamped     Luciano Ruths LPN [facility]

## 2023-08-28 ENCOUNTER — Other Ambulatory Visit: Payer: Self-pay

## 2023-08-28 DIAGNOSIS — C9000 Multiple myeloma not having achieved remission: Secondary | ICD-10-CM | POA: Diagnosis not present

## 2023-08-28 LAB — RENAL FUNCTION PANEL
Albumin: 2.4 g/dL — ABNORMAL LOW (ref 3.5–5.0)
Anion gap: 11 (ref 5–15)
BUN: 60 mg/dL — ABNORMAL HIGH (ref 6–20)
CO2: 26 mmol/L (ref 22–32)
Calcium: 8.5 mg/dL — ABNORMAL LOW (ref 8.9–10.3)
Chloride: 94 mmol/L — ABNORMAL LOW (ref 98–111)
Creatinine, Ser: 1.67 mg/dL — ABNORMAL HIGH (ref 0.61–1.24)
GFR, Estimated: 47 mL/min — ABNORMAL LOW (ref >60)
Glucose, Bld: 86 mg/dL (ref 70–99)
Phosphorus: 3.2 mg/dL (ref 2.5–4.6)
Potassium: 3.5 mmol/L (ref 3.5–5.1)
Sodium: 131 mmol/L — ABNORMAL LOW (ref 135–145)

## 2023-08-28 LAB — CBC WITH DIFFERENTIAL/PLATELET
Abs Immature Granulocytes: 0 10*3/uL (ref 0.00–0.07)
Basophils Absolute: 0 10*3/uL (ref 0.0–0.1)
Basophils Relative: 0 %
Eosinophils Absolute: 0 10*3/uL (ref 0.0–0.5)
Eosinophils Relative: 0 %
HCT: 28.8 % — ABNORMAL LOW (ref 39.0–52.0)
Hemoglobin: 9.3 g/dL — ABNORMAL LOW (ref 13.0–17.0)
Lymphocytes Relative: 14 %
Lymphs Abs: 0.5 10*3/uL — ABNORMAL LOW (ref 0.7–4.0)
MCH: 33.5 pg (ref 26.0–34.0)
MCHC: 32.3 g/dL (ref 30.0–36.0)
MCV: 103.6 fL — ABNORMAL HIGH (ref 80.0–100.0)
Monocytes Absolute: 0.1 10*3/uL (ref 0.1–1.0)
Monocytes Relative: 4 %
Neutro Abs: 2.7 10*3/uL (ref 1.7–7.7)
Neutrophils Relative %: 82 %
Platelets: 103 10*3/uL — ABNORMAL LOW (ref 150–400)
RBC: 2.78 MIL/uL — ABNORMAL LOW (ref 4.22–5.81)
RDW: 22.5 % — ABNORMAL HIGH (ref 11.5–15.5)
WBC: 3.3 10*3/uL — ABNORMAL LOW (ref 4.0–10.5)
nRBC: 0 % (ref 0.0–0.2)
nRBC: 0 /100{WBCs}

## 2023-08-28 LAB — CULTURE, BLOOD (ROUTINE X 2)
Culture: NO GROWTH
Special Requests: ADEQUATE

## 2023-08-28 LAB — GLUCOSE, CAPILLARY
Glucose-Capillary: 142 mg/dL — ABNORMAL HIGH (ref 70–99)
Glucose-Capillary: 172 mg/dL — ABNORMAL HIGH (ref 70–99)
Glucose-Capillary: 182 mg/dL — ABNORMAL HIGH (ref 70–99)
Glucose-Capillary: 226 mg/dL — ABNORMAL HIGH (ref 70–99)
Glucose-Capillary: 280 mg/dL — ABNORMAL HIGH (ref 70–99)
Glucose-Capillary: 84 mg/dL (ref 70–99)
Glucose-Capillary: 86 mg/dL (ref 70–99)

## 2023-08-28 LAB — HEPATIC FUNCTION PANEL
ALT: 16 U/L (ref 0–44)
AST: 21 U/L (ref 15–41)
Albumin: 2.4 g/dL — ABNORMAL LOW (ref 3.5–5.0)
Alkaline Phosphatase: 55 U/L (ref 38–126)
Bilirubin, Direct: 0.1 mg/dL (ref 0.0–0.2)
Indirect Bilirubin: 0.5 mg/dL (ref 0.3–0.9)
Total Bilirubin: 0.6 mg/dL (ref 0.0–1.2)
Total Protein: 6.5 g/dL (ref 6.5–8.1)

## 2023-08-28 LAB — PATHOLOGIST SMEAR REVIEW

## 2023-08-28 MED ORDER — SODIUM CHLORIDE 0.9 % IV SOLN
20.0000 mg | Freq: Once | INTRAVENOUS | Status: AC
  Administered 2023-08-28: 20 mg via INTRAVENOUS
  Filled 2023-08-28: qty 2

## 2023-08-28 MED ORDER — MONTELUKAST SODIUM 5 MG PO CHEW
5.0000 mg | CHEWABLE_TABLET | Freq: Once | ORAL | Status: AC
  Administered 2023-08-28: 5 mg
  Filled 2023-08-28: qty 1

## 2023-08-28 MED ORDER — DEXTROSE 5 % IV SOLN
56.0000 mg/m2 | Freq: Once | INTRAVENOUS | Status: AC
  Administered 2023-08-28: 120 mg via INTRAVENOUS
  Filled 2023-08-28: qty 60

## 2023-08-28 MED ORDER — SODIUM CHLORIDE 0.9 % IV SOLN
400.0000 mg/m2 | Freq: Once | INTRAVENOUS | Status: AC
  Administered 2023-08-28: 1000 mg via INTRAVENOUS
  Filled 2023-08-28: qty 50

## 2023-08-28 MED ORDER — SODIUM CHLORIDE 0.9 % IV SOLN
10.0000 mg/kg | Freq: Once | INTRAVENOUS | Status: AC
  Administered 2023-08-28: 1200 mg via INTRAVENOUS
  Filled 2023-08-28: qty 60

## 2023-08-28 MED ORDER — SODIUM CHLORIDE 0.9 % IV SOLN: INTRAVENOUS | Status: DC

## 2023-08-28 MED ORDER — DIPHENHYDRAMINE HCL 50 MG/ML IJ SOLN
50.0000 mg | Freq: Once | INTRAMUSCULAR | Status: AC
  Administered 2023-08-28: 50 mg via INTRAVENOUS
  Filled 2023-08-28: qty 1

## 2023-08-28 MED ORDER — ACETAMINOPHEN 325 MG PO TABS
650.0000 mg | ORAL_TABLET | Freq: Once | ORAL | Status: AC
  Administered 2023-08-28: 650 mg
  Filled 2023-08-28: qty 2

## 2023-08-28 MED ORDER — FAMOTIDINE IN NACL 20-0.9 MG/50ML-% IV SOLN
20.0000 mg | Freq: Once | INTRAVENOUS | Status: AC
  Administered 2023-08-28: 20 mg via INTRAVENOUS
  Filled 2023-08-28: qty 50

## 2023-08-28 NOTE — Progress Notes (Signed)
 SLP Cancellation Note  Patient Details Name: Frank [name]. MRN: [id number] DOB: [birth year]   Cancelled treatment:       Reason Eval/Treat Not Completed: Patient at procedure or test/unavailable   Jaqua Ching, Hardin Leys 08/28/2023, 11:27 AM

## 2023-08-28 NOTE — Progress Notes (Signed)
 PROGRESS NOTE    Frank [name].  EAV:[id number] DOB: 1965/10/09 DOA: 07/03/2023 PCP: Serita Danes, MD   Brief Narrative: 58 year old PMH OSA CPAP, pathological fracture and multiple ORIF left Humerus likely due to multiple myeloma, A-fib not on anticoagulation, hyperlipidemia, tobacco use disorder who was  feeling weak for 1 week, admitted  to [facility] health with concern of sepsis, community-acquired pneumonia and acute encephalopathy.  Patient was found to have lytic lesions,  he was transferred to [facility] for  oncology treatment.  In the meantime he developed encephalopathy, acute renal failure.  He remained in the ICU for 36 days.  Underwent tracheostomy. He was also started on HD after  CRRT.  Subsequently PEG tube placed 5/29.  Significant events 4/17-Admitted to medical floor with oncology consultation to treat multiple myeloma. 4/19-transferred to ICU intubated, encephalopathic. 4/20-A-fib with RVR. Started heparin . Groin and biopsy site hematoma requiring multiple transfusions. EEG 4/25 >> moderate to severe encephalopathy without any seizures or epileptiform discharges. 4/29 Received Velcade  and Cytoxan  chemotherapy-intent palliative? 5/2 LP and ETT exchange  5/8 Trach placed. Some post op bleeding improved with thrombi-pad 5/10 Voriconazole  added back based on CT Chest findings 5/13 more alert and able to follow some commands. 5/23, transferred to floor. 5/29-PEG placed 5/31 chemotherapy changed to Sarclisa /Kyprolis /Cytoxan . 6/13 plan to remove HD cathter for holiday line    Assessment & Plan:   Principal Problem:   Multiple myeloma not having achieved remission (HCC) Active Problems:   AKI (acute kidney injury) (HCC)   Acute hypoxemic respiratory failure (HCC)   Goals of care, counseling/discussion   Acute metabolic encephalopathy   Lobar pneumonia, unspecified organism (HCC)   Fever   Aspergillosis, unspecified (HCC)   Hematoma of right thigh   Generalized  weakness   Atrial fibrillation with rapid ventricular response (HCC)   PAF (paroxysmal atrial fibrillation) (HCC)   On mechanically assisted ventilation (HCC)   Multiple myeloma without remission (HCC)   Protein-calorie malnutrition, severe   Malfunction of tracheostomy stoma (HCC)   Seizure disorder (HCC)   DMII (diabetes mellitus, type 2) (HCC)  1-Recurrent fever, SIRS, sepsis, Staph Epi Bacteremia:  - Intermittent fevers.  ID is planning to treat him for Bacteremia.  - Blood cultures drawn 6/7 1 out of 3 bottles with Staph Epi  -ID planning on IV antibiotics, considering removing hemodialysis cath attempt for holiday line - Repeated blood culture 6/9: No growth to date - Unstageable Sacral Ulcer needs to be monitored as well; continue WOC rec's   Aspergillosis with pneumonia -On Voriconazole  per ID -On Acyclovir   Multiple myeloma - He was  started on Velcade  and Cytoxan .  Chemotherapy changed to Sarclisa /Kyprolis /Cytoxan  on 5/31  -Dr Maria Shiner following.  Getting Chemo.   Goals of care - Unfortunately poor long-term prognosis - Palliative care has discussed with wife previously.  Remain full code on full scope of care  Acute Hypoxic Respiratory Failure: - Pulmonology following intermittently  -Trach removed 6/11  Right Thigh Hematoma -per CT on 08/01/23: 12.1 x 30.2 x 7.6 cm subcutaneous hematoma within the anterior right thigh. 8.4 x 10.4 x 22.2 cm subcutaneous hematoma within the right gluteal region - continue supportive care -hb stable.   Severe acute metabolic encephalopathy with hypoactive delirium, seizure disorder - Normal MRI of the brain.  LP negative for meningitis or encephalitis.  EEG negative for seizures but encephalopathy. - Home Depakote  discontinued in ICU.   Insulin -dependent diabetes type 2 with hyperglycemia - A1c 6.0 - Continue with sliding  scale insulin   AKI on CKD stage IIIa, Uremia,  Hyponatremia, Hypokalemia -Due to MM, hypercalcemia and ACE  inhibitors?   Paroxysmal A-fib - Unable to  anticoagulation due to hematoma. Rate controlled.   Obstructive Sleep Apnea:  -Trach removed  Anxiety/Bipolar disorder.  - Continue with  Paxil  and Depakote   Severe Malnutrition:  - Continue tube feeding.  History of Pathological fracture and multiple ORIF left Humerus 01/2023  Hypokalemia; Correction with HD.   Hyponatremia; Correction with HD.  See wound care Documentation below.   Pressure Injury 07/05/23 Heel Left;Right Unstageable - Full thickness tissue loss in which the base of the injury is covered by slough (yellow, tan, gray, green or brown) and/or eschar (tan, brown or black) in the wound bed. (Active)  07/05/23 1108  Location: Heel  Location Orientation: Left;Right  Staging: Unstageable - Full thickness tissue loss in which the base of the injury is covered by slough (yellow, tan, gray, green or brown) and/or eschar (tan, brown or black) in the wound bed.  Wound Description (Comments):   DO NOT USE:  Present on Admission: Yes  Dressing Type Foam - Lift dressing to assess site every shift 08/27/23 2213     Pressure Injury 07/11/23 Sacrum Mid Unstageable - Full thickness tissue loss in which the base of the injury is covered by slough (yellow, tan, gray, green or brown) and/or eschar (tan, brown or black) in the wound bed. (Active)  07/11/23 1600  Location: Sacrum  Location Orientation: Mid  Staging: Unstageable - Full thickness tissue loss in which the base of the injury is covered by slough (yellow, tan, gray, green or brown) and/or eschar (tan, brown or black) in the wound bed.  Wound Description (Comments):   DO NOT USE:  Present on Admission: No  Dressing Type Foam - Lift dressing to assess site every shift 08/27/23 2213     Pressure Injury 07/27/23 Buttocks Right;Lower (Active)  07/27/23 1700  Location: Buttocks  Location Orientation: Right;Lower  Staging:   Wound Description (Comments):   DO NOT USE:  Present  on Admission: Yes  Dressing Type Foam - Lift dressing to assess site every shift 08/27/23 2213     Pressure Injury 08/25/23 Buttocks Left Stage 2 -  Partial thickness loss of dermis presenting as a shallow open injury with a red, pink wound bed without slough. (Active)  08/25/23   Location: Buttocks  Location Orientation: Left  Staging: Stage 2 -  Partial thickness loss of dermis presenting as a shallow open injury with a red, pink wound bed without slough.  Wound Description (Comments):   DO NOT USE:  Present on Admission: No  Dressing Type Foam - Lift dressing to assess site every shift 08/27/23 2213     Nutrition Problem: Severe Malnutrition Etiology: acute illness (prolonged illness, interuptions in feeding)    Signs/Symptoms: moderate fat depletion, moderate muscle depletion, percent weight loss (9.6% x 1 month) Percent weight loss: 9.6 %    Interventions: Prostat, Tube feeding, MVI  Estimated body mass index is 36.77 kg/m as calculated from the following:   Height as of this encounter: 6' 0.01 (1.829 m).   Weight as of this encounter: 123 kg.   DVT prophylaxis: Heparin  Code Status: Full code Family Communication: No family at bedside Disposition Plan:  Status is: Inpatient Remains inpatient appropriate because: Management of encephalopathy: Debilitated, hemodialysis, Needs to be able to seat for HD    Consultants:  Oncology CCM ID  Procedures:    Antimicrobials:  Voriconazole   08/05/2023 >> current Vancomycin  08/24/2023 >> current  Subjective: He is alert, follows some command  Objective: Vitals:   08/27/23 2028 08/27/23 2111 08/28/23 0020 08/28/23 0432  BP: (!) 118/94  113/87 (!) 115/93  Pulse: (!) 101 96 (!) 103 (!) 101  Resp: (!) 21 17 20 20   Temp: 98.7 F (37.1 C)  98.3 F (36.8 C) 99.8 F (37.7 C)  TempSrc: Oral  Oral Axillary  SpO2: 92% 92% 92% 91%  Weight:    123 kg  Height:        Intake/Output Summary (Last 24 hours) at 08/28/2023  8295 Last data filed at 08/27/2023 2027 Gross per 24 hour  Intake --  Output 2400 ml  Net -2400 ml   Filed Weights   08/27/23 0841 08/27/23 1234 08/28/23 0432  Weight: 127 kg 126 kg 123 kg    Examination:  General exam: NAD Respiratory system: BL, ronchus Cardiovascular system: S 1, S 2 RRR Gastrointestinal system: BS present, soft, nt Central nervous system:Alert, very weak.  Extremities: no edema   Data Reviewed: I have personally reviewed following labs and imaging studies  CBC: Recent Labs  Lab 08/24/23 0744 08/25/23 0419 08/26/23 0433 08/27/23 0442 08/28/23 0515  WBC 3.6* 3.5* 3.2* 2.8* 3.3*  NEUTROABS 2.7 2.2 2.2 2.1 PENDING  HGB 10.3* 9.5* 9.0* 9.0* 9.3*  HCT 32.0* 30.2* 28.1* 28.0* 28.8*  MCV 103.6* 105.6* 104.5* 103.3* 103.6*  PLT 154 130* 110* 106* 103*   Basic Metabolic Panel: Recent Labs  Lab 08/23/23 0353 08/24/23 0744 08/25/23 0419 08/26/23 0433 08/27/23 0442 08/28/23 0515  NA 131* 131* 130* 131* 131* 131*  K 4.3 4.4 4.3 3.8 3.3* 3.5  CL 94* 93* 93* 95* 96* 94*  CO2 24 22 21* 24 27 26   GLUCOSE 100* 86 234* 107* 96 86  BUN 90* 147* 174* 89* 65* 60*  CREATININE 2.75* 3.57* 3.58* 2.07* 1.74* 1.67*  CALCIUM  7.7* 8.0* 8.2* 8.3* 8.6* 8.5*  MG 2.7* 3.0* 2.9* 2.6* 2.3  --   PHOS 4.4 5.7* 5.4* 3.4 3.2 3.2   GFR: Estimated Creatinine Clearance: 64.5 mL/min (A) (by C-G formula based on SCr of 1.67 mg/dL (H)). Liver Function Tests: Recent Labs  Lab 08/24/23 0744 08/25/23 0419 08/26/23 0433 08/27/23 0442 08/28/23 0515  ALBUMIN  2.6* 2.5* 2.4* 2.3* 2.4*   No results for input(s): LIPASE, AMYLASE in the last 168 hours. No results for input(s): AMMONIA in the last 168 hours. Coagulation Profile: No results for input(s): INR, PROTIME in the last 168 hours. Cardiac Enzymes: No results for input(s): CKTOTAL, CKMB, CKMBINDEX, TROPONINI in the last 168 hours. BNP (last 3 results) No results for input(s): PROBNP in the last 8760  hours. HbA1C: No results for input(s): HGBA1C in the last 72 hours. CBG: Recent Labs  Lab 08/27/23 0730 08/27/23 1552 08/27/23 2025 08/28/23 0018 08/28/23 0432  GLUCAP 95 197* 149* 142* 86   Lipid Profile: No results for input(s): CHOL, HDL, LDLCALC, TRIG, CHOLHDL, LDLDIRECT in the last 72 hours. Thyroid Function Tests: No results for input(s): TSH, T4TOTAL, FREET4, T3FREE, THYROIDAB in the last 72 hours. Anemia Panel: No results for input(s): VITAMINB12, FOLATE, FERRITIN, TIBC, IRON, RETICCTPCT in the last 72 hours. Sepsis Labs: No results for input(s): PROCALCITON, LATICACIDVEN in the last 168 hours.  Recent Results (from the past 240 hours)  Urine Culture     Status: None   Collection Time: 08/23/23  6:18 AM   Specimen: Urine, Random  Result Value Ref Range Status   Specimen  Description URINE, RANDOM  Final   Special Requests NONE Reflexed from [phone]  Final   Culture   Final    NO GROWTH Performed at [facility], [address] [place], [place] [address]    Report Status 08/24/2023 FINAL  Final  Culture, blood (Routine X 2) w Reflex to ID Panel     Status: None   Collection Time: 08/23/23  9:35 AM   Specimen: BLOOD LEFT HAND  Result Value Ref Range Status   Specimen Description BLOOD LEFT HAND  Final   Special Requests   Final    BOTTLES DRAWN AEROBIC ONLY Blood Culture adequate volume   Culture   Final    NO GROWTH 5 DAYS Performed at [facility], [address] [place], [place] [address]    Report Status 08/28/2023 FINAL  Final  Culture, blood (Routine X 2) w Reflex to ID Panel     Status: Abnormal   Collection Time: 08/23/23  9:35 AM   Specimen: BLOOD RIGHT ARM  Result Value Ref Range Status   Specimen Description BLOOD RIGHT ARM  Final   Special Requests   Final    BOTTLES DRAWN AEROBIC AND ANAEROBIC Blood Culture adequate volume   Culture  Setup Time   Final    GRAM POSITIVE COCCI IN  TETRADS AEROBIC BOTTLE ONLY CRITICAL RESULT CALLED TO, READ BACK BY AND VERIFIED WITH: PHARMD T. DANG [id number] AT 1330 BY EC    Culture (A)  Final    STAPHYLOCOCCUS EPIDERMIDIS THE SIGNIFICANCE OF ISOLATING THIS ORGANISM FROM A SINGLE SET OF BLOOD CULTURES WHEN MULTIPLE SETS ARE DRAWN IS UNCERTAIN. PLEASE NOTIFY THE MICROBIOLOGY DEPARTMENT WITHIN ONE WEEK IF SPECIATION AND SENSITIVITIES ARE REQUIRED. Performed at [facility], [address] [place], [place] [address]    Report Status 08/25/2023 FINAL  Final  Blood Culture ID Panel (Reflexed)     Status: Abnormal   Collection Time: 08/23/23  9:35 AM  Result Value Ref Range Status   Enterococcus faecalis NOT DETECTED NOT DETECTED Final   Enterococcus Faecium NOT DETECTED NOT DETECTED Final   Listeria monocytogenes NOT DETECTED NOT DETECTED Final   Staphylococcus species DETECTED (A) NOT DETECTED Final    Comment: CRITICAL RESULT CALLED TO, READ BACK BY AND VERIFIED WITH: PHARMD T. DANG [id number] AT 1330 BY EC    Staphylococcus aureus (BCID) NOT DETECTED NOT DETECTED Final   Staphylococcus epidermidis DETECTED (A) NOT DETECTED Final    Comment: Methicillin (oxacillin) resistant coagulase negative staphylococcus. Possible blood culture contaminant (unless isolated from more than one blood culture draw or clinical case suggests pathogenicity). No antibiotic treatment is indicated for blood  culture contaminants. CRITICAL RESULT CALLED TO, READ BACK BY AND VERIFIED WITH: PHARMD T. DANG [id number] AT 1330 BY EC    Staphylococcus lugdunensis NOT DETECTED NOT DETECTED Final   Streptococcus species NOT DETECTED NOT DETECTED Final   Streptococcus agalactiae NOT DETECTED NOT DETECTED Final   Streptococcus pneumoniae NOT DETECTED NOT DETECTED Final   Streptococcus pyogenes NOT DETECTED NOT DETECTED Final   A.calcoaceticus-baumannii NOT DETECTED NOT DETECTED Final   Bacteroides fragilis NOT DETECTED NOT DETECTED Final   Enterobacterales NOT  DETECTED NOT DETECTED Final   Enterobacter cloacae complex NOT DETECTED NOT DETECTED Final   Escherichia coli NOT DETECTED NOT DETECTED Final   Klebsiella aerogenes NOT DETECTED NOT DETECTED Final   Klebsiella oxytoca NOT DETECTED NOT DETECTED Final   Klebsiella pneumoniae NOT DETECTED NOT DETECTED Final   Proteus  species NOT DETECTED NOT DETECTED Final   Salmonella species NOT DETECTED NOT DETECTED Final   Serratia marcescens NOT DETECTED NOT DETECTED Final   Haemophilus influenzae NOT DETECTED NOT DETECTED Final   Neisseria meningitidis NOT DETECTED NOT DETECTED Final   Pseudomonas aeruginosa NOT DETECTED NOT DETECTED Final   Stenotrophomonas maltophilia NOT DETECTED NOT DETECTED Final   Candida albicans NOT DETECTED NOT DETECTED Final   Candida auris NOT DETECTED NOT DETECTED Final   Candida glabrata NOT DETECTED NOT DETECTED Final   Candida krusei NOT DETECTED NOT DETECTED Final   Candida parapsilosis NOT DETECTED NOT DETECTED Final   Candida tropicalis NOT DETECTED NOT DETECTED Final   Cryptococcus neoformans/gattii NOT DETECTED NOT DETECTED Final   Methicillin resistance mecA/C DETECTED (A) NOT DETECTED Final    Comment: CRITICAL RESULT CALLED TO, READ BACK BY AND VERIFIED WITH: PHARMD T. DANG [id number] AT 1330 BY EC Performed at [facility], [address], [place], [place] [address]   Culture, blood (Routine X 2) w Reflex to ID Panel     Status: None (Preliminary result)   Collection Time: 08/25/23 11:09 AM   Specimen: BLOOD RIGHT ARM  Result Value Ref Range Status   Specimen Description BLOOD RIGHT ARM  Final   Special Requests   Final    BOTTLES DRAWN AEROBIC ONLY Blood Culture adequate volume   Culture   Final    NO GROWTH 3 DAYS Performed at [facility], [address], [place], [place] [address]    Report Status PENDING  Incomplete  Culture, blood (Routine X 2) w Reflex to ID Panel     Status: None (Preliminary result)   Collection Time: 08/25/23 11:10  AM   Specimen: BLOOD LEFT HAND  Result Value Ref Range Status   Specimen Description BLOOD LEFT HAND  Final   Special Requests   Final    BOTTLES DRAWN AEROBIC ONLY Blood Culture results may not be optimal due to an inadequate volume of blood received in culture bottles   Culture   Final    NO GROWTH 3 DAYS Performed at [facility], [address] [place], [place] [address]    Report Status PENDING  Incomplete         Radiology Studies: No results found.       Scheduled Meds:  acyclovir   200 mg Per Tube BID   arformoterol   15 mcg Nebulization BID   collagenase    Topical Daily   feeding supplement (NEPRO CARB STEADY)  355 mL Per Tube TID PC & HS   feeding supplement (PROSource TF20)  60 mL Per Tube Daily   fiber  1 packet Per Tube BID   heparin  injection (subcutaneous)  5,000 Units Subcutaneous Q8H   insulin  aspart  0-15 Units Subcutaneous TID AC & HS   insulin  aspart  2 Units Subcutaneous TID AC & HS   insulin  glargine-yfgn  10 Units Subcutaneous BID   midodrine   10 mg Per Tube Q M,W,F-HD   montelukast   5 mg Per Tube QHS   multivitamin  1 tablet Per Tube QHS   nicotine   7 mg Transdermal Daily   nutrition supplement (JUVEN)  1 packet Per Tube BID BM   mouth rinse  15 mL Mouth Rinse 4 times per day   PARoxetine   40 mg Per Tube Daily   pneumococcal 20-valent conjugate vaccine  0.5 mL Intramuscular Tomorrow-1000   revefenacin   175 mcg Nebulization Daily   sodium chloride  flush  10-40 mL Intracatheter Q12H   voriconazole   350 mg Per Tube Q12H   Continuous Infusions:  sodium chloride  Stopped (08/21/23 1753)   valproate sodium  750 mg (08/27/23 2219)   vancomycin  1,000 mg (08/27/23 1112)     LOS: 56 days    Time spent: 35 minutes    Eliel Dudding A Gene Glazebrook, MD [facility]   If 7PM-7AM, please contact night-coverage [email or web address]  08/28/2023, 7:02 AM

## 2023-08-28 NOTE — Progress Notes (Signed)
 Patient ID: Frank [name]., male   DOB: 1965-12-20, 58 y.o.   MRN: [id number] [facility] Progress Note   Assessment/ Plan:    1. Acute kidney Injury on CKD3a: secondary to myeloma kidney. Transitioned to IHD on MWF schedule after previously being on CRRT; dialysis has been a little challenging in the face of hypotension.  Had extra HD on 6/10 to optimize clearance -------------------------------- - MWF HD.  Note predialysis administration of midodrine .   - urine output is better.  Monitoring for renal recovery.  Not suitable for outpatient dialysis placement yet (inability to sit in recliner and limited facilities able to care for tracheostomy) though making strides  - ID has recommended a line holiday after HD on 6/13.  Will do HD first shift on 6/13 to allow for the same.  Will consult IR for tunneled catheter removal after HD on 6/13    2. Acute metabolic encephalopathy: appears to wax and wane per charting  - optimize HD as above   3. Multiple myeloma:  - Closely followed by Dr. Maria Shiner and started on aggressive regimen with Sarclisa , Kyprolis  and Cytoxan  - dosing of chemo agents including cytoxan  per hem/onc and pharmacy  4. 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  Note plans to treat through 10/21/23 per ID note  5. Acute hypoxic respiratory failure - with trach capped - pulm following  6. Anemia: from acute illness and multiple myeloma. ESA per oncology    7. Bacteremia - noted staph epidermidis from 6/7 blood cultures.   - Repeat blood cultures on 6/9 are NGTD  - Line holiday as above.  - abx per primary team and ID - currently getting vanc   Disposition - continue inpatient monitoring     Subjective:    He had 1.1 L UOP over 6/11.  Last HD on 6/11 with 1.3 kg UF.  His trach was decannulated on 6/11 per nursing report.  ID requests line holiday for bacteremia.  I spoke with his wife and she does consent to  tunneled catheter removal for a line holiday.  Got chemo today and had IV benadryl  with that.  His nurse states that he was more interactive when his wife was here earlier today.   Review of systems:      Unable to obtain secondary to patient factors - decannulated but very sleepy after benadryl  He denies air hunger      Objective:   BP 112/71 (BP Location: Right Leg)   Pulse (!) 105   Temp 98 F (36.7 C) (Oral)   Resp 19   Ht 6' 0.01 (1.829 m)   Wt 123 kg   SpO2 90%   BMI 36.77 kg/m   Intake/Output Summary (Last 24 hours) at 08/28/2023 1628 Last data filed at 08/28/2023 0857 Gross per 24 hour  Intake 239.41 ml  Output 750 ml  Net -510.59 ml   Weight change:   Physical Exam:     General adult male in bed in no acute distress HEENT normocephalic atraumatic extraocular movements intact sclera anicteric Neck supple; bandage over prior trach site Lungs clear to auscultation bilaterally normal work of breathing at rest  Heart S1S2 no rub  Abdomen soft nontender nondistended Extremities no edema  Psych no anxiety or agitation  Neuro sleepy; nods or shakes head to answer basic questions  Access RIJ Tunneled dialysis catheter    Imaging: No results found.    Labs: [employer] 08/22/23 [phone]  08/23/23 0353 08/24/23 0744 08/25/23 0419 08/26/23 0433 08/27/23 0442 08/28/23 0515  NA 130* 131* 131* 130* 131* 131* 131*  K 4.4 4.3 4.4 4.3 3.8 3.3* 3.5  CL 94* 94* 93* 93* 95* 96* 94*  CO2 20* 24 22 21* 24 27 26   GLUCOSE 131* 100* 86 234* 107* 96 86  BUN 143* 90* 147* 174* 89* 65* 60*  CREATININE 3.80* 2.75* 3.57* 3.58* 2.07* 1.74* 1.67*  CALCIUM  8.1* 7.7* 8.0* 8.2* 8.3* 8.6* 8.5*  PHOS 6.2* 4.4 5.7* 5.4* 3.4 3.2 3.2   CBC Recent Labs  Lab 08/25/23 0419 08/26/23 0433 08/27/23 0442 08/28/23 0515  WBC 3.5* 3.2* 2.8* 3.3*  NEUTROABS 2.2 2.2 2.1 2.7  HGB 9.5* 9.0* 9.0* 9.3*  HCT 30.2* 28.1* 28.0* 28.8*  MCV 105.6* 104.5* 103.3* 103.6*  PLT 130* 110*  106* 103*    Medications:     acyclovir   200 mg Per Tube BID   arformoterol   15 mcg Nebulization BID   collagenase    Topical Daily   feeding supplement (NEPRO CARB STEADY)  355 mL Per Tube TID PC & HS   feeding supplement (PROSource TF20)  60 mL Per Tube Daily   fiber  1 packet Per Tube BID   heparin  injection (subcutaneous)  5,000 Units Subcutaneous Q8H   insulin  aspart  0-15 Units Subcutaneous TID AC & HS   insulin  aspart  2 Units Subcutaneous TID AC & HS   insulin  glargine-yfgn  10 Units Subcutaneous BID   midodrine   10 mg Per Tube Q M,W,F-HD   montelukast   5 mg Per Tube QHS   multivitamin  1 tablet Per Tube QHS   nicotine   7 mg Transdermal Daily   nutrition supplement (JUVEN)  1 packet Per Tube BID BM   mouth rinse  15 mL Mouth Rinse 4 times per day   PARoxetine   40 mg Per Tube Daily   pneumococcal 20-valent conjugate vaccine  0.5 mL Intramuscular Tomorrow-1000   revefenacin   175 mcg Nebulization Daily   sodium chloride  flush  10-40 mL Intracatheter Q12H   voriconazole   350 mg Per Tube Q12H    Nan Aver, MD 08/28/2023, 4:53 PM

## 2023-08-28 NOTE — Progress Notes (Signed)
 OT Cancellation Note  Patient Details Name: Frank [name]. MRN: [id number] DOB: 09/21/1965   Cancelled Treatment:    Reason Eval/Treat Not Completed: Patient at procedure or test/ unavailable (Pt receiving chemo, chemo RN present in the room states medication will run for at least 4 more hours. OT to follow up as able once medication is finished.)  Starla Easterly 08/28/2023, 11:28 AM

## 2023-08-28 NOTE — Progress Notes (Signed)
 [name] continues to make slow but steady improvement.  He was able to talk a little bit.  I was happy to be able to talk to him.  Apparently, ID feels that his bacteremia needs to be treated.  He is on vancomycin  now.  I still feel he we can treat him for the myeloma.  There are no labs back yet today.  He is still getting dialysis.  He gets dialysis Monday-Wednesday-Friday.  He is still getting tube feeds.  Hopefully, he will be able to have regular feeds.  I know that speech pathology is working with him to help with swallowing.  I do not think he has had any fevers.  His vital signs show temperature 99.8.  Pulse 101.  Blood pressure 115/93.  His lungs sound clear bilaterally.  His oral cavity seems to be a little bit cleaner.  His cardiac exam regular rate and rhythm.  Abdomen is soft.  Bowel sounds are present.  Extremities shows a brace on the left arm.  He has IgG kappa myeloma.  So far, I think he is responding.  However, his light chains have been on the rise so we are switching protocols.  He will get week 2 of the Sarclisa /Kyprolis /Cytoxan  protocol.  I am just happy that he is improving.  Hopefully, we will continue to see improvement in multiple facets of his health.  I do appreciate everybody's help in trying to get him treated at [facility].  [name], MD  Psalm 30:6

## 2023-08-28 NOTE — Progress Notes (Signed)
 PT Cancellation Note  Patient Details Name: Frank [name]. MRN: [id number] DOB: [birth year]   Cancelled Treatment:    Reason Eval/Treat Not Completed: Patient at procedure or test/unavailable (Pt undergoing chemo right now. Chemo nurse reports that it will be another 4 hours before he is finished. Will follow up tomorrow.)   Jannie Doyle 08/28/2023, 11:30 AM

## 2023-08-28 NOTE — Progress Notes (Signed)
 Ok to treat with elevated HR (will recheck), ANC pending (ok to use ANC from 08/27/23), Scr 1.67, and Cmet from 08/20/23.   Jobe Mulder Hickman, [place], BCPS, BCOP 08/28/2023 7:32 AM

## 2023-08-29 ENCOUNTER — Inpatient Hospital Stay (HOSPITAL_COMMUNITY)

## 2023-08-29 DIAGNOSIS — C9000 Multiple myeloma not having achieved remission: Secondary | ICD-10-CM | POA: Diagnosis not present

## 2023-08-29 HISTORY — PX: IR REMOVAL TUN CV CATH W/O FL: IMG2289

## 2023-08-29 LAB — RENAL FUNCTION PANEL
Albumin: 2.1 g/dL — ABNORMAL LOW (ref 3.5–5.0)
Anion gap: 13 (ref 5–15)
BUN: 100 mg/dL — ABNORMAL HIGH (ref 6–20)
CO2: 23 mmol/L (ref 22–32)
Calcium: 8.3 mg/dL — ABNORMAL LOW (ref 8.9–10.3)
Chloride: 92 mmol/L — ABNORMAL LOW (ref 98–111)
Creatinine, Ser: 2.54 mg/dL — ABNORMAL HIGH (ref 0.61–1.24)
GFR, Estimated: 28 mL/min — ABNORMAL LOW (ref >60)
Glucose, Bld: 115 mg/dL — ABNORMAL HIGH (ref 70–99)
Phosphorus: 3.1 mg/dL (ref 2.5–4.6)
Potassium: 3.7 mmol/L (ref 3.5–5.1)
Sodium: 128 mmol/L — ABNORMAL LOW (ref 135–145)

## 2023-08-29 LAB — GLUCOSE, CAPILLARY
Glucose-Capillary: 110 mg/dL — ABNORMAL HIGH (ref 70–99)
Glucose-Capillary: 123 mg/dL — ABNORMAL HIGH (ref 70–99)
Glucose-Capillary: 136 mg/dL — ABNORMAL HIGH (ref 70–99)
Glucose-Capillary: 206 mg/dL — ABNORMAL HIGH (ref 70–99)
Glucose-Capillary: 237 mg/dL — ABNORMAL HIGH (ref 70–99)
Glucose-Capillary: 92 mg/dL (ref 70–99)
Glucose-Capillary: 94 mg/dL (ref 70–99)

## 2023-08-29 LAB — HEPATITIS B SURFACE ANTIGEN: Hepatitis B Surface Ag: NONREACTIVE

## 2023-08-29 MED ORDER — VANCOMYCIN VARIABLE DOSE PER UNSTABLE RENAL FUNCTION (PHARMACIST DOSING): Status: DC

## 2023-08-29 MED ORDER — PENTAFLUOROPROP-TETRAFLUOROETH EX AERO: 1.0000 | INHALATION_SPRAY | CUTANEOUS | Status: DC | PRN

## 2023-08-29 MED ORDER — ALTEPLASE 2 MG IJ SOLR: 2.0000 mg | Freq: Once | INTRAMUSCULAR | Status: DC | PRN

## 2023-08-29 MED ORDER — HEPARIN SODIUM (PORCINE) 1000 UNIT/ML IJ SOLN
INTRAMUSCULAR | Status: AC
  Filled 2023-08-29: qty 4

## 2023-08-29 MED ORDER — VANCOMYCIN HCL IN DEXTROSE 1-5 GM/200ML-% IV SOLN
INTRAVENOUS | Status: AC
  Filled 2023-08-29: qty 200

## 2023-08-29 MED ORDER — ANTICOAGULANT SODIUM CITRATE 4% (200MG/5ML) IV SOLN: 5.0000 mL | Status: DC | PRN

## 2023-08-29 MED ORDER — LIDOCAINE-PRILOCAINE 2.5-2.5 % EX CREA: 1.0000 | TOPICAL_CREAM | CUTANEOUS | Status: DC | PRN

## 2023-08-29 MED ORDER — SENNA 8.6 MG PO TABS
1.0000 | ORAL_TABLET | Freq: Every day | ORAL | Status: DC
  Administered 2023-08-29 – 2023-09-19 (×14): 8.6 mg
  Filled 2023-08-29 (×18): qty 1

## 2023-08-29 MED ORDER — HEPARIN SODIUM (PORCINE) 1000 UNIT/ML DIALYSIS
1000.0000 [IU] | INTRAMUSCULAR | Status: DC | PRN
  Administered 2023-08-29: 3800 [IU]

## 2023-08-29 NOTE — Progress Notes (Signed)
 Patient is back to the unit, vital signs stable, will continue to monitor

## 2023-08-29 NOTE — Progress Notes (Signed)
Patient off the unit to HD

## 2023-08-29 NOTE — Progress Notes (Signed)
 PROGRESS NOTE    Frank [name].  ZOX:[id number] DOB: 01/07/1966 DOA: 07/03/2023 PCP: Serita Danes, MD   Brief Narrative: 58 year old PMH OSA CPAP, pathological fracture and multiple ORIF left Humerus likely due to multiple myeloma, A-fib not on anticoagulation, hyperlipidemia, tobacco use disorder who was  feeling weak for 1 week, admitted  to [facility] with concern of sepsis, community-acquired pneumonia and acute encephalopathy.  Patient was found to have lytic lesions,  he was transferred to [facility] for  oncology treatment.  In the meantime he developed encephalopathy, acute renal failure.  He remained in the ICU for 36 days.  Underwent tracheostomy. He was also started on HD after  CRRT.  Subsequently PEG tube placed 5/29.  Significant events 4/17-Admitted to medical floor with oncology consultation to treat multiple myeloma. 4/19-transferred to ICU intubated, encephalopathic. 4/20-A-fib with RVR. Started heparin . Groin and biopsy site hematoma requiring multiple transfusions. EEG 4/25 >> moderate to severe encephalopathy without any seizures or epileptiform discharges. 4/29 Received Velcade  and Cytoxan  chemotherapy-intent palliative? 5/2 LP and ETT exchange  5/8 Trach placed. Some post op bleeding improved with thrombi-pad 5/10 Voriconazole  added back based on CT Chest findings 5/13 more alert and able to follow some commands. 5/23, transferred to floor. 5/29-PEG placed 5/31 chemotherapy changed to Sarclisa /Kyprolis /Cytoxan . 6/13 plan to remove HD cathter for holiday line    Assessment & Plan:   Principal Problem:   Multiple myeloma not having achieved remission (HCC) Active Problems:   AKI (acute kidney injury) (HCC)   Acute hypoxemic respiratory failure (HCC)   Goals of care, counseling/discussion   Acute metabolic encephalopathy   Lobar pneumonia, unspecified organism (HCC)   Fever   Aspergillosis, unspecified (HCC)   Hematoma of right thigh   Generalized  weakness   Atrial fibrillation with rapid ventricular response (HCC)   PAF (paroxysmal atrial fibrillation) (HCC)   On mechanically assisted ventilation (HCC)   Multiple myeloma without remission (HCC)   Protein-calorie malnutrition, severe   Malfunction of tracheostomy stoma (HCC)   Seizure disorder (HCC)   DMII (diabetes mellitus, type 2) (HCC)  1-Recurrent fever, SIRS, sepsis, Staph Epi Bacteremia:  - Intermittent fevers.  ID is planning to treat him for Bacteremia.  - Blood cultures drawn 6/7 1 out of 3 bottles with Staph Epi  -ID planning on IV antibiotics,  -Plan to remove hemodialysis cath 6/13  for holiday line - Repeated blood culture 6/9: No growth to date - Unstageable Sacral Ulcer needs to be monitored as well; continue WOC rec's -He has been afebrile.   Aspergillosis with pneumonia -On Voriconazole  per ID -On Acyclovir   Multiple myeloma - He was  started on Velcade  and Cytoxan .  Chemotherapy changed to Sarclisa /Kyprolis /Cytoxan  on 5/31  -Dr Maria Shiner following.  Getting Chemo.   Goals of care - Unfortunately poor long-term prognosis - Palliative care has discussed with wife previously.  Remain full code on full scope of care  Acute Hypoxic Respiratory Failure: - Pulmonology following intermittently  -Trach removed 6/11  Right Thigh Hematoma -per CT on 08/01/23: 12.1 x 30.2 x 7.6 cm subcutaneous hematoma within the anterior right thigh. 8.4 x 10.4 x 22.2 cm subcutaneous hematoma within the right gluteal region - continue supportive care -hb stable.   Severe acute metabolic encephalopathy with hypoactive delirium, seizure disorder - Normal MRI of the brain.  LP negative for meningitis or encephalitis.  EEG negative for seizures but encephalopathy. - Home Depakote  discontinued in ICU.   Insulin -dependent diabetes type 2 with hyperglycemia -  A1c 6.0 - Continue with sliding scale insulin   AKI on CKD stage IIIa, Uremia,  Hyponatremia, Hypokalemia -Due to MM,  hypercalcemia and ACE inhibitors?   Paroxysmal A-fib - Unable to  anticoagulation due to hematoma. Rate controlled.   Obstructive Sleep Apnea:  -Trach removed  Anxiety/Bipolar disorder.  - Continue with  Paxil  and Depakote   Severe Malnutrition:  - Continue tube feeding.  History of Pathological fracture and multiple ORIF left Humerus 01/2023  Hypokalemia; Correction with HD.   Hyponatremia; Correction with HD.  See wound care Documentation below.   Pressure Injury 07/05/23 Heel Left;Right Unstageable - Full thickness tissue loss in which the base of the injury is covered by slough (yellow, tan, gray, green or brown) and/or eschar (tan, brown or black) in the wound bed. (Active)  07/05/23 1108  Location: Heel  Location Orientation: Left;Right  Staging: Unstageable - Full thickness tissue loss in which the base of the injury is covered by slough (yellow, tan, gray, green or brown) and/or eschar (tan, brown or black) in the wound bed.  Wound Description (Comments):   DO NOT USE:  Present on Admission: Yes  Dressing Type Foam - Lift dressing to assess site every shift 08/29/23 0800     Pressure Injury 07/11/23 Sacrum Mid Unstageable - Full thickness tissue loss in which the base of the injury is covered by slough (yellow, tan, gray, green or brown) and/or eschar (tan, brown or black) in the wound bed. (Active)  07/11/23 1600  Location: Sacrum  Location Orientation: Mid  Staging: Unstageable - Full thickness tissue loss in which the base of the injury is covered by slough (yellow, tan, gray, green or brown) and/or eschar (tan, brown or black) in the wound bed.  Wound Description (Comments):   DO NOT USE:  Present on Admission: No  Dressing Type Foam - Lift dressing to assess site every shift 08/29/23 0800     Pressure Injury 07/27/23 Buttocks Right;Lower (Active)  07/27/23 1700  Location: Buttocks  Location Orientation: Right;Lower  Staging:   Wound Description (Comments):    DO NOT USE:  Present on Admission: Yes  Dressing Type Foam - Lift dressing to assess site every shift 08/28/23 0800     Pressure Injury 08/25/23 Buttocks Left Stage 2 -  Partial thickness loss of dermis presenting as a shallow open injury with a red, pink wound bed without slough. (Active)  08/25/23   Location: Buttocks  Location Orientation: Left  Staging: Stage 2 -  Partial thickness loss of dermis presenting as a shallow open injury with a red, pink wound bed without slough.  Wound Description (Comments):   DO NOT USE:  Present on Admission: No  Dressing Type Foam - Lift dressing to assess site every shift 08/28/23 0800     Nutrition Problem: Severe Malnutrition Etiology: acute illness (prolonged illness, interuptions in feeding)    Signs/Symptoms: moderate fat depletion, moderate muscle depletion, percent weight loss (9.6% x 1 month) Percent weight loss: 9.6 %    Interventions: Prostat, Tube feeding, MVI  Estimated body mass index is 37.46 kg/m as calculated from the following:   Height as of this encounter: 6' 0.01 (1.829 m).   Weight as of this encounter: 125.3 kg.   DVT prophylaxis: Heparin  Code Status: Full code Family Communication: Wife over phone 6/12 Disposition Plan:  Status is: Inpatient Remains inpatient appropriate because: Management of encephalopathy: Debilitated, hemodialysis, Needs to be able to seat for HD    Consultants:  Oncology CCM ID  Procedures:  Antimicrobials:  Voriconazole  08/05/2023 >> current Vancomycin  08/24/2023 >> current  Subjective: He is alert, he is asking something for pain.  He is tired.   Objective: Vitals:   08/29/23 1230 08/29/23 1249 08/29/23 1251 08/29/23 1300  BP: (!) 113/94 (!) 115/91 110/89 (!) 119/101  Pulse: (!) 106 (!) 101 (!) 101   Resp: (!) 22 (!) 22 (!) 23 20  Temp:   (!) 96.9 F (36.1 C) (!) 97.4 F (36.3 C)  TempSrc:    Oral  SpO2: 98% 96% 95%   Weight:      Height:        Intake/Output  Summary (Last 24 hours) at 08/29/2023 1442 Last data filed at 08/29/2023 1251 Gross per 24 hour  Intake 984.2 ml  Output 451.9 ml  Net 532.3 ml   Filed Weights   08/28/23 0432 08/29/23 0450 08/29/23 0935  Weight: 123 kg 125 kg 125.3 kg    Examination:  General exam: NAD Respiratory system: BL air movement.  Cardiovascular system: S 1, S 2 RRR Gastrointestinal system: BS present, soft, nt Central nervous system: Alert, generalized weakness Extremities: no edema   Data Reviewed: I have personally reviewed following labs and imaging studies  CBC: Recent Labs  Lab 08/24/23 0744 08/25/23 0419 08/26/23 0433 08/27/23 0442 08/28/23 0515  WBC 3.6* 3.5* 3.2* 2.8* 3.3*  NEUTROABS 2.7 2.2 2.2 2.1 2.7  HGB 10.3* 9.5* 9.0* 9.0* 9.3*  HCT 32.0* 30.2* 28.1* 28.0* 28.8*  MCV 103.6* 105.6* 104.5* 103.3* 103.6*  PLT 154 130* 110* 106* 103*   Basic Metabolic Panel: Recent Labs  Lab 08/23/23 0353 08/24/23 0744 08/25/23 0419 08/26/23 0433 08/27/23 0442 08/28/23 0515 08/29/23 0752  NA 131* 131* 130* 131* 131* 131* 128*  K 4.3 4.4 4.3 3.8 3.3* 3.5 3.7  CL 94* 93* 93* 95* 96* 94* 92*  CO2 24 22 21* 24 27 26 23   GLUCOSE 100* 86 234* 107* 96 86 115*  BUN 90* 147* 174* 89* 65* 60* 100*  CREATININE 2.75* 3.57* 3.58* 2.07* 1.74* 1.67* 2.54*  CALCIUM  7.7* 8.0* 8.2* 8.3* 8.6* 8.5* 8.3*  MG 2.7* 3.0* 2.9* 2.6* 2.3  --   --   PHOS 4.4 5.7* 5.4* 3.4 3.2 3.2 3.1   GFR: Estimated Creatinine Clearance: 42.8 mL/min (A) (by C-G formula based on SCr of 2.54 mg/dL (H)). Liver Function Tests: Recent Labs  Lab 08/25/23 0419 08/26/23 0433 08/27/23 0442 08/28/23 0515 08/29/23 0752  AST  --   --   --  21  --   ALT  --   --   --  16  --   ALKPHOS  --   --   --  55  --   BILITOT  --   --   --  0.6  --   PROT  --   --   --  6.5  --   ALBUMIN  2.5* 2.4* 2.3* 2.4*  2.4* 2.1*   No results for input(s): LIPASE, AMYLASE in the last 168 hours. No results for input(s): AMMONIA in the last  168 hours. Coagulation Profile: No results for input(s): INR, PROTIME in the last 168 hours. Cardiac Enzymes: No results for input(s): CKTOTAL, CKMB, CKMBINDEX, TROPONINI in the last 168 hours. BNP (last 3 results) No results for input(s): PROBNP in the last 8760 hours. HbA1C: No results for input(s): HGBA1C in the last 72 hours. CBG: Recent Labs  Lab 08/28/23 2149 08/29/23 0018 08/29/23 0340 08/29/23 0753 08/29/23 1341  GLUCAP 182* 123* 94 110*  136*   Lipid Profile: No results for input(s): CHOL, HDL, LDLCALC, TRIG, CHOLHDL, LDLDIRECT in the last 72 hours. Thyroid Function Tests: No results for input(s): TSH, T4TOTAL, FREET4, T3FREE, THYROIDAB in the last 72 hours. Anemia Panel: No results for input(s): VITAMINB12, FOLATE, FERRITIN, TIBC, IRON, RETICCTPCT in the last 72 hours. Sepsis Labs: No results for input(s): PROCALCITON, LATICACIDVEN in the last 168 hours.  Recent Results (from the past 240 hours)  Urine Culture     Status: None   Collection Time: 08/23/23  6:18 AM   Specimen: Urine, Random  Result Value Ref Range Status   Specimen Description URINE, RANDOM  Final   Special Requests NONE Reflexed from [id number]  Final   Culture   Final    NO GROWTH Performed at [facility], [address] [place], [place] [address]    Report Status 08/24/2023 FINAL  Final  Culture, blood (Routine X 2) w Reflex to ID Panel     Status: None   Collection Time: 08/23/23  9:35 AM   Specimen: BLOOD LEFT HAND  Result Value Ref Range Status   Specimen Description BLOOD LEFT HAND  Final   Special Requests   Final    BOTTLES DRAWN AEROBIC ONLY Blood Culture adequate volume   Culture   Final    NO GROWTH 5 DAYS Performed at [facility], [address], [place], [place] [address]    Report Status 08/28/2023 FINAL  Final  Culture, blood (Routine X 2) w Reflex to ID Panel     Status: Abnormal   Collection Time: 08/23/23   9:35 AM   Specimen: BLOOD RIGHT ARM  Result Value Ref Range Status   Specimen Description BLOOD RIGHT ARM  Final   Special Requests   Final    BOTTLES DRAWN AEROBIC AND ANAEROBIC Blood Culture adequate volume   Culture  Setup Time   Final    GRAM POSITIVE COCCI IN TETRADS AEROBIC BOTTLE ONLY CRITICAL RESULT CALLED TO, READ BACK BY AND VERIFIED WITH: PHARMD T. DANG [id number] AT 1330 BY EC    Culture (A)  Final    STAPHYLOCOCCUS EPIDERMIDIS THE SIGNIFICANCE OF ISOLATING THIS ORGANISM FROM A SINGLE SET OF BLOOD CULTURES WHEN MULTIPLE SETS ARE DRAWN IS UNCERTAIN. PLEASE NOTIFY THE MICROBIOLOGY DEPARTMENT WITHIN ONE WEEK IF SPECIATION AND SENSITIVITIES ARE REQUIRED. Performed at [facility], [address] [place], [place] [address]    Report Status 08/25/2023 FINAL  Final  Blood Culture ID Panel (Reflexed)     Status: Abnormal   Collection Time: 08/23/23  9:35 AM  Result Value Ref Range Status   Enterococcus faecalis NOT DETECTED NOT DETECTED Final   Enterococcus Faecium NOT DETECTED NOT DETECTED Final   Listeria monocytogenes NOT DETECTED NOT DETECTED Final   Staphylococcus species DETECTED (A) NOT DETECTED Final    Comment: CRITICAL RESULT CALLED TO, READ BACK BY AND VERIFIED WITH: PHARMD T. DANG [id number] AT 1330 BY EC    Staphylococcus aureus (BCID) NOT DETECTED NOT DETECTED Final   Staphylococcus epidermidis DETECTED (A) NOT DETECTED Final    Comment: Methicillin (oxacillin) resistant coagulase negative staphylococcus. Possible blood culture contaminant (unless isolated from more than one blood culture draw or clinical case suggests pathogenicity). No antibiotic treatment is indicated for blood  culture contaminants. CRITICAL RESULT CALLED TO, READ BACK BY AND VERIFIED WITH: PHARMD T. DANG [id number] AT 1330 BY EC    Staphylococcus lugdunensis NOT DETECTED NOT DETECTED Final   Streptococcus species NOT  DETECTED NOT DETECTED Final   Streptococcus agalactiae NOT DETECTED NOT  DETECTED Final   Streptococcus pneumoniae NOT DETECTED NOT DETECTED Final   Streptococcus pyogenes NOT DETECTED NOT DETECTED Final   A.calcoaceticus-baumannii NOT DETECTED NOT DETECTED Final   Bacteroides fragilis NOT DETECTED NOT DETECTED Final   Enterobacterales NOT DETECTED NOT DETECTED Final   Enterobacter cloacae complex NOT DETECTED NOT DETECTED Final   Escherichia coli NOT DETECTED NOT DETECTED Final   Klebsiella aerogenes NOT DETECTED NOT DETECTED Final   Klebsiella oxytoca NOT DETECTED NOT DETECTED Final   Klebsiella pneumoniae NOT DETECTED NOT DETECTED Final   Proteus species NOT DETECTED NOT DETECTED Final   Salmonella species NOT DETECTED NOT DETECTED Final   Serratia marcescens NOT DETECTED NOT DETECTED Final   Haemophilus influenzae NOT DETECTED NOT DETECTED Final   Neisseria meningitidis NOT DETECTED NOT DETECTED Final   Pseudomonas aeruginosa NOT DETECTED NOT DETECTED Final   Stenotrophomonas maltophilia NOT DETECTED NOT DETECTED Final   Candida albicans NOT DETECTED NOT DETECTED Final   Candida auris NOT DETECTED NOT DETECTED Final   Candida glabrata NOT DETECTED NOT DETECTED Final   Candida krusei NOT DETECTED NOT DETECTED Final   Candida parapsilosis NOT DETECTED NOT DETECTED Final   Candida tropicalis NOT DETECTED NOT DETECTED Final   Cryptococcus neoformans/gattii NOT DETECTED NOT DETECTED Final   Methicillin resistance mecA/C DETECTED (A) NOT DETECTED Final    Comment: CRITICAL RESULT CALLED TO, READ BACK BY AND VERIFIED WITH: PHARMD T. DANG [id number] AT 1330 BY EC Performed at [facility], [address] [place], [place] [address]   Culture, blood (Routine X 2) w Reflex to ID Panel     Status: None (Preliminary result)   Collection Time: 08/25/23 11:09 AM   Specimen: BLOOD RIGHT ARM  Result Value Ref Range Status   Specimen Description BLOOD RIGHT ARM  Final   Special Requests   Final    BOTTLES DRAWN AEROBIC ONLY Blood Culture adequate volume    Culture   Final    NO GROWTH 4 DAYS Performed at [facility], [address], [place], [place] [address]    Report Status PENDING  Incomplete  Culture, blood (Routine X 2) w Reflex to ID Panel     Status: None (Preliminary result)   Collection Time: 08/25/23 11:10 AM   Specimen: BLOOD LEFT HAND  Result Value Ref Range Status   Specimen Description BLOOD LEFT HAND  Final   Special Requests   Final    BOTTLES DRAWN AEROBIC ONLY Blood Culture results may not be optimal due to an inadequate volume of blood received in culture bottles   Culture   Final    NO GROWTH 4 DAYS Performed at [facility], [address], [place], [place] [address]    Report Status PENDING  Incomplete         Radiology Studies: No results found.       Scheduled Meds:  acyclovir   200 mg Per Tube BID   arformoterol   15 mcg Nebulization BID   collagenase    Topical Daily   feeding supplement (NEPRO CARB STEADY)  355 mL Per Tube TID PC & HS   feeding supplement (PROSource TF20)  60 mL Per Tube Daily   fiber  1 packet Per Tube BID   heparin  injection (subcutaneous)  5,000 Units Subcutaneous Q8H   insulin  aspart  0-15 Units Subcutaneous TID AC & HS   insulin  aspart  2 Units Subcutaneous TID  AC & HS   insulin  glargine-yfgn  10 Units Subcutaneous BID   midodrine   10 mg Per Tube Q M,W,F-HD   montelukast   5 mg Per Tube QHS   multivitamin  1 tablet Per Tube QHS   nicotine   7 mg Transdermal Daily   nutrition supplement (JUVEN)  1 packet Per Tube BID BM   mouth rinse  15 mL Mouth Rinse 4 times per day   PARoxetine   40 mg Per Tube Daily   pneumococcal 20-valent conjugate vaccine  0.5 mL Intramuscular Tomorrow-1000   revefenacin   175 mcg Nebulization Daily   senna  1 tablet Per Tube Daily   sodium chloride  flush  10-40 mL Intracatheter Q12H   vancomycin  variable dose per unstable renal function (pharmacist dosing)   Does not apply See admin instructions   voriconazole   350 mg Per Tube Q12H    Continuous Infusions:  sodium chloride  Stopped (08/28/23 1208)   sodium chloride  Stopped (08/28/23 0839)   valproate sodium  750 mg (08/29/23 0845)     LOS: 57 days    Time spent: 35 minutes    Easton Fetty A Jaylea Plourde, MD [facility]   If 7PM-7AM, please contact night-coverage [email or web address]  08/29/2023, 2:42 PM

## 2023-08-29 NOTE — Progress Notes (Signed)
 Patient is back to the unit.

## 2023-08-29 NOTE — Progress Notes (Signed)
 Physical Therapy Treatment Patient Details Name: Frank [name]. MRN: [id number] DOB: 06-07-1965 Today's Date: 08/29/2023   History of Present Illness 58 y.o. male transferred from [facility] 07/03/23 to [facility] for management of newly diagnosed plasma cell myeloma with weakness and AMS. Pt also with AKI and metabolic encephalopathy. 5/10 transfer to [facility] for iHD. 4/22 Intubated and chemo initiated. 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 5/6 IVIG started. 5/8-6/11 trach. 5/12 iHD initiated. 5/29 PEG placed. 6/5 chemo initiated. PMHx:Lt THA, carpal tunnel syndrome, Lt TKA, Lt humerus fx, PAF, HTN, HLD, bipolar disorder, obesity, T2DM    PT Comments  Pt tolerated treatment well today. Co-treat with OT. Pt with good progress today. Pt able to sit EOB for ~10 minutes to work on sitting balance. Pt continues to be limited by decreased activity tolerance and pain however pt is much more verbal now and appears to be highly motivated. No change in DC/DME recs at this time. PT will continue to follow.     If plan is discharge home, recommend the following: Two people to help with walking and/or transfers;Assistance with cooking/housework;Assist for transportation;Two people to help with bathing/dressing/bathroom;Direct supervision/assist for medications management;Direct supervision/assist for financial management;Help with stairs or ramp for entrance;Assistance with feeding   Can travel by private vehicle     No  Equipment Recommendations  Hospital bed;Hoyer lift;Wheelchair (measurements PT);Wheelchair cushion (measurements PT);BSC/3in1    Recommendations for Other Services       Precautions / Restrictions Precautions Precautions: Fall Recall of Precautions/Restrictions: Impaired Restrictions Weight Bearing Restrictions Per Provider Order: Yes LUE Weight Bearing Per Provider Order: Non weight bearing Other Position/Activity Restrictions: Lt humerus non-union, splinted     Mobility  Bed  Mobility Overal bed mobility: Needs Assistance Bed Mobility: Supine to Sit, Sit to Supine     Supine to sit: +2 for physical assistance, Max assist Sit to supine: +2 for physical assistance, Max assist   General bed mobility comments: +2 Max A via helicopter method.    Transfers                   General transfer comment: Pt declined.    Ambulation/Gait               General Gait Details: Deferred at this time   Stairs             Wheelchair Mobility     Tilt Bed    Modified Rankin (Stroke Patients Only)       Balance Overall balance assessment: Needs assistance Sitting-balance support: Single extremity supported, No upper extremity supported, Feet supported Sitting balance-Leahy Scale: Poor Sitting balance - Comments: Max A with heavy R lateral lean Postural control: Right lateral lean, Posterior lean                                  Communication Communication Communication: Impaired Factors Affecting Communication: Reduced clarity of speech  Cognition Arousal: Alert Behavior During Therapy: WFL for tasks assessed/performed                             Following commands: Impaired Following commands impaired: Follows one step commands with increased time    Cueing Cueing Techniques: Verbal cues  Exercises      General Comments General comments (skin integrity, edema, etc.): BP: 120/97 seated EOB      Pertinent Vitals/Pain Pain  Assessment Pain Assessment: Faces Faces Pain Scale: Hurts even more Pain Location: my ass Pain Descriptors / Indicators: Grimacing Pain Intervention(s): Monitored during session, Limited activity within patient's tolerance    Home Living Family/patient expects to be discharged to:: (P) Private residence Living Arrangements: (P) Spouse/significant other Available Help at Discharge: (P) Family;Available PRN/intermittently Type of Home: (P) Mobile home Home Access: (P) Ramped  entrance       Home Layout: (P) One level Home Equipment: (P) Rolling Walker (2 wheels);Cane - single point;Wheelchair - Engineer, technical sales - power;Electric scooter;Shower seat;Grab bars - toilet;Grab bars - tub/shower;Hand held shower head;BSC/3in1 Additional Comments: (P) Information from prior encounter    Prior Function            PT Goals (current goals can now be found in the care plan section) Progress towards PT goals: Progressing toward goals    Frequency    Min 1X/week      PT Plan      Co-evaluation              AM-PAC PT 6 Clicks Mobility   Outcome Measure  Help needed turning from your back to your side while in a flat bed without using bedrails?: Total Help needed moving from lying on your back to sitting on the side of a flat bed without using bedrails?: Total Help needed moving to and from a bed to a chair (including a wheelchair)?: Total Help needed standing up from a chair using your arms (e.g., wheelchair or bedside chair)?: Total Help needed to walk in hospital room?: Total Help needed climbing 3-5 steps with a railing? : Total 6 Click Score: 6    End of Session   Activity Tolerance: Patient tolerated treatment well;Patient limited by fatigue;Patient limited by pain Patient left: in bed;with call bell/phone within reach;with bed alarm set;with SCD's reapplied;Other (comment) Nurse Communication: Mobility status PT Visit Diagnosis: Other abnormalities of gait and mobility (R26.89);Muscle weakness (generalized) (M62.81);Difficulty in walking, not elsewhere classified (R26.2);Unsteadiness on feet (R26.81)     Time: 1610-9604 PT Time Calculation (min) (ACUTE ONLY): 18 min  Charges:    $Therapeutic Activity: 8-22 mins PT General Charges $$ ACUTE PT VISIT: 1 Visit                     Nicklas Mcsweeney B, PT, DPT Acute Rehab Services [phone]    Sherryn Pollino 08/29/2023, 2:45 PM

## 2023-08-29 NOTE — Progress Notes (Signed)
 PT Cancellation Note  Patient Details Name: Frank [name]. MRN: [id number] DOB: 02-01-1966   Cancelled Treatment:    Reason Eval/Treat Not Completed: Patient at procedure or test/unavailable (Pt off the floor at dialysis. Will follow up if time allows.)   Jw Covin 08/29/2023, 9:57 AM

## 2023-08-29 NOTE — Procedures (Signed)
 Interventional Radiology Procedure Note  PROCEDURE SUMMARY:  Successful removal of tunneled catheter.  No complications.   EBL = trace  Please see full dictation in imaging section of Epic for procedure details.  Consent obtained from wife for replacement of a new tunneled HD catheter when patient is cleared by ID and bacteremia resolved.  Shakeria Robinette S Meshelle Holness

## 2023-08-29 NOTE — Progress Notes (Addendum)
 Speech Language Pathology Treatment: Dysphagia  Patient Details Name: Frank [name]. MRN: [id number] DOB: [birth year] Today's Date: 08/29/2023 Time: 0102-7253 SLP Time Calculation (min) (ACUTE ONLY): 12 min  Assessment / Plan / Recommendation Clinical Impression  Able to see pt right before being taken to dialysis, alert and following commands. Pt was decannulated 6/11 and there was no audible air leak at stoma site with pt verbalizing with adequate intensity and clarity. Oral care provided followed by ice chip trials and one teaspoon sip water  with immediate cough.  RN reports he has been getting ice chips which will help facilitate swallow musculature. Swallow exercises performed including effortful swallow- initiating swallow is delayed. Attempted Masako to facilitate tongue base retraction and pharyngeal wall contraction however he was unable to perform and after several attempts became frustrated and would not try again. Pitch variation with high pitched /e/ sound x 5 for laryngeal elevation. Pt to continue with ice chips after oral care and continue ST.   HPI HPI: 58 y.o. male transferred from [facility] 07/03/23 to [facility] for management of newly diagnosed plasma cell myeloma with weakness and AMS. Pt also with AKI and metabolic encephalopathy. 5/10 transfer to [facility] for iHD.Bedside swallow eval 4/18 wiht recs for NPO due to somnolence. 4/22 Intubated and chemo initiated. 4/23-5/10 CRRT. 5/6 IVIG started. 5/8 trach. 5/12 iHD initiated. 5/18 inability to oxygenate through trach, intubated and trach revision. PMHx:Lt THA, carpal tunnel syndrome, Lt TKA, Lt humerus fx, PAF, HTN, HLD, bipolar disorder, obesity, T2DM      SLP Plan  Continue with current plan of care          Recommendations  Diet recommendations: NPO;Other(comment) (ice chips) Medication Administration: Via alternative means                  Oral care QID   Intermittent Supervision/Assistance Dysphagia,  oropharyngeal phase (R13.12)     Continue with current plan of care     [name]  08/29/2023, 8:31 AM

## 2023-08-29 NOTE — Progress Notes (Signed)
 Patient ID: Frank [name]., male   DOB: 1966/02/11, 58 y.o.   MRN: [id number] [facility] Progress Note   Assessment/ Plan:    1. Acute kidney Injury on CKD3a: secondary to myeloma kidney. Transitioned to IHD on MWF schedule after previously being on CRRT; dialysis has been a little challenging in the face of hypotension.  Had extra HD on 6/10 to optimize clearance -------------------------------- - MWF HD.  Note predialysis administration of midodrine .   - urine output is better.  Monitoring for renal recovery.  Not suitable for outpatient dialysis placement yet (inability to sit in recliner).  [name] was decannulated, though, so making strides  - ID has recommended a line holiday after HD on 6/13.  Getting HD first shift on 6/13 to allow for the same.  Have consulted IR for tunneled catheter removal after HD on 6/13    2. Acute metabolic encephalopathy: appears to wax and wane per charting  - optimize HD as above   3. Multiple myeloma:  - Closely followed by Dr. Maria Shiner and started on aggressive regimen with Sarclisa , Kyprolis  and Cytoxan  - dosing of chemo agents including cytoxan  per hem/onc and pharmacy  4. 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  Note plans to treat through 10/21/23 per ID note  5. Acute hypoxic respiratory failure - with trach removed on 6/11 - pulm following  6. Anemia: from acute illness and multiple myeloma. ESA per oncology    7. Bacteremia - noted staph epidermidis from 6/7 blood cultures.   - Repeat blood cultures on 6/9 are NGTD  - Line holiday as above.  - abx per primary team and ID - currently getting vanc   Disposition - continue inpatient monitoring     Subjective:    He had 800 mL UOP over 6/12.  Seen and examined on dialysis.  Procedure supervised.  Blood pressure 105/82 and HR 104.  Tolerating goal.  RIJ tunneled catheter.  He is here first shift today to allow for a line holiday for  bacteremia.  Had chemo on 6/12.   Review of systems:  He denies air hunger Denies n/v; thirsty and we have swabbed his mouth      Objective:   BP 105/82   Pulse (!) 103   Temp (!) (P) 97 F (36.1 C) (Axillary)   Resp (!) 22   Ht 6' 0.01 (1.829 m)   Wt 125.3 kg   SpO2 98%   BMI 37.46 kg/m   Intake/Output Summary (Last 24 hours) at 08/29/2023 1104 Last data filed at 08/28/2023 2219 Gross per 24 hour  Intake 984.2 ml  Output 450 ml  Net 534.2 ml   Weight change: -2 kg  Physical Exam:     General adult male in bed in no acute distress HEENT normocephalic atraumatic extraocular movements intact sclera anicteric Neck supple; bandage over prior trach site Lungs clear but decreased to auscultation bilaterally normal work of breathing at rest; wet cough  Heart S1S2 no rub  Abdomen soft nontender nondistended Extremities no edema  Psych no anxiety or agitation  Neuro awake.  Alert and oriented x3 and speaks today.  Prolonged time before stating location of hospital  Access RIJ Tunneled dialysis catheter    Imaging: No results found.    Labs: BMET Recent Labs  Lab 08/23/23 0353 08/24/23 0744 08/25/23 0419 08/26/23 0433 08/27/23 0442 08/28/23 0515 08/29/23 0752  NA 131* 131* 130* 131* 131* 131* 128*  K 4.3 4.4 4.3  3.8 3.3* 3.5 3.7  CL 94* 93* 93* 95* 96* 94* 92*  CO2 24 22 21* 24 27 26 23   GLUCOSE 100* 86 234* 409* 96 86 115*  BUN 90* 147* 174* 89* 65* 60* 100*  CREATININE 2.75* 3.57* 3.58* 2.07* 1.74* 1.67* 2.54*  CALCIUM  7.7* 8.0* 8.2* 8.3* 8.6* 8.5* 8.3*  PHOS 4.4 5.7* 5.4* 3.4 3.2 3.2 3.1   CBC Recent Labs  Lab 08/25/23 0419 08/26/23 0433 08/27/23 0442 08/28/23 0515  WBC 3.5* 3.2* 2.8* 3.3*  NEUTROABS 2.2 2.2 2.1 2.7  HGB 9.5* 9.0* 9.0* 9.3*  HCT 30.2* 28.1* 28.0* 28.8*  MCV 105.6* 104.5* 103.3* 103.6*  PLT 130* 110* 106* 103*    Medications:     acyclovir   200 mg Per Tube BID   arformoterol   15 mcg Nebulization BID   collagenase     Topical Daily   feeding supplement (NEPRO CARB STEADY)  355 mL Per Tube TID PC & HS   feeding supplement (PROSource TF20)  60 mL Per Tube Daily   fiber  1 packet Per Tube BID   heparin  injection (subcutaneous)  5,000 Units Subcutaneous Q8H   insulin  aspart  0-15 Units Subcutaneous TID AC & HS   insulin  aspart  2 Units Subcutaneous TID AC & HS   insulin  glargine-yfgn  10 Units Subcutaneous BID   midodrine   10 mg Per Tube Q M,W,F-HD   montelukast   5 mg Per Tube QHS   multivitamin  1 tablet Per Tube QHS   nicotine   7 mg Transdermal Daily   nutrition supplement (JUVEN)  1 packet Per Tube BID BM   mouth rinse  15 mL Mouth Rinse 4 times per day   PARoxetine   40 mg Per Tube Daily   pneumococcal 20-valent conjugate vaccine  0.5 mL Intramuscular Tomorrow-1000   revefenacin   175 mcg Nebulization Daily   sodium chloride  flush  10-40 mL Intracatheter Q12H   vancomycin  variable dose per unstable renal function (pharmacist dosing)   Does not apply See admin instructions   voriconazole   350 mg Per Tube Q12H    Nan Aver, MD 08/29/2023, 11:17 AM

## 2023-08-29 NOTE — Progress Notes (Signed)
 HD called; report given. Pt to have HD first shift today.

## 2023-08-29 NOTE — Progress Notes (Signed)
 OT Cancellation Note  Patient Details Name: Frank [name]. MRN: [id number] DOB: Mar 05, 1966   Cancelled Treatment:    Reason Eval/Treat Not Completed: Patient at procedure or test/ unavailable (Pt has been in HD all morning, OT to continue evaluation efforts.)  Starla Easterly 08/29/2023, 12:48 PM

## 2023-08-29 NOTE — Evaluation (Signed)
 Occupational Therapy Evaluation Patient Details Name: Frank [name]. MRN: [id number] DOB: 15-Oct-1965 Today's Date: 08/29/2023   History of Present Illness   58 y.o. male transferred from [facility] 07/03/23 to [facility] for management of newly diagnosed plasma cell myeloma with weakness and AMS. Pt also with AKI and metabolic encephalopathy. 5/10 transfer to [facility] for iHD. 4/22 Intubated and chemo initiated. 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 5/6 IVIG started. 5/8-6/11 trach. 5/12 iHD initiated. 5/29 PEG placed. 6/5 chemo initiated. PMHx:Lt THA, carpal tunnel syndrome, Lt TKA, Lt humerus fx, PAF, HTN, HLD, bipolar disorder, obesity, T2DM     Clinical Impressions Frank [name] was evaluated s/p the above admission list. Per chart review, he is indep at baseline despite his initial L humeral fx in 01/2023. Upon evaluation the pt was limited by general debility, weakness, poor activity tolerance, rear peri pain, dizziness with sitting, impaired cognition and poor sitting balance. Overall he required max A +2 for sitting bed mobility and max A to correct R lateral lean in sitting. Pt declined standing attempt. Due to the deficits listed below the pt also needs mod/, max A for UB ADLs and max/total A for LB ADLs. Pt will benefit from continued acute OT services and skilled inpatient follow up therapy, <3 hours/day.      If plan is discharge home, recommend the following:   A lot of help with walking and/or transfers;Two people to help with walking and/or transfers;A lot of help with bathing/dressing/bathroom;Two people to help with bathing/dressing/bathroom;Assistance with cooking/housework;Assistance with feeding;Direct supervision/assist for medications management;Direct supervision/assist for financial management;Assist for transportation;Help with stairs or ramp for entrance     Functional Status Assessment   Patient has had a recent decline in their functional status and demonstrates the ability to make  significant improvements in function in a reasonable and predictable amount of time.     Equipment Recommendations   Other (comment) (pending progress)      Precautions/Restrictions   Precautions Precautions: Fall Recall of Precautions/Restrictions: Impaired Restrictions Weight Bearing Restrictions Per Provider Order: Yes LUE Weight Bearing Per Provider Order: Non weight bearing Other Position/Activity Restrictions: Lt humerus non-union, splinted     Mobility Bed Mobility Overal bed mobility: Needs Assistance Bed Mobility: Sit to Supine, Supine to Sit     Supine to sit: +2 for physical assistance, +2 for safety/equipment, Max assist Sit to supine: Max assist, +2 for physical assistance, +2 for safety/equipment        Transfers                   General transfer comment: pt declined      Balance Overall balance assessment: Needs assistance Sitting-balance support: Single extremity supported, No upper extremity supported, Feet supported Sitting balance-Leahy Scale: Poor Sitting balance - Comments: mod-total A for sitting balance with R lateral lean. Pt states it is too painful to sit directly on his bottom Postural control: Right lateral lean                                 ADL either performed or assessed with clinical judgement   ADL Overall ADL's : Needs assistance/impaired Eating/Feeding: NPO   Grooming: Moderate assistance;Sitting   Upper Body Bathing: Moderate assistance;Sitting   Lower Body Bathing: Total assistance   Upper Body Dressing : Maximal assistance   Lower Body Dressing: Total assistance   Toilet Transfer: Total assistance   Toileting- Clothing Manipulation and Hygiene:  Total assistance       Functional mobility during ADLs: Maximal assistance;+2 for physical assistance;+2 for safety/equipment General ADL Comments: limited by general debility, peri pain, poor sitting balacne, minimal functional use of LUE and  impaired cognition     Vision Baseline Vision/History: 1 Wears glasses Vision Assessment?: No apparent visual deficits     Perception Perception: Not tested       Praxis Praxis: Not tested       Pertinent Vitals/Pain Pain Assessment Pain Assessment: Faces Faces Pain Scale: Hurts even more Pain Location: my ass Pain Descriptors / Indicators: Grimacing Pain Intervention(s): Limited activity within patient's tolerance, Monitored during session     Extremity/Trunk Assessment Upper Extremity Assessment Upper Extremity Assessment: LUE deficits/detail;RUE deficits/detail RUE Deficits / Details: Generalized weakness, pt using functionally during bed mobility RUE Sensation: WNL LUE Deficits / Details: NWB, splinted. Limited hand ROM. poor grip strength. LUE Sensation: decreased light touch LUE Coordination: decreased fine motor;decreased gross motor   Lower Extremity Assessment Lower Extremity Assessment: Defer to PT evaluation   Cervical / Trunk Assessment Cervical / Trunk Assessment: Kyphotic   Communication Communication Communication: Impaired Factors Affecting Communication: Reduced clarity of speech   Cognition Arousal: Alert Behavior During Therapy: WFL for tasks assessed/performed Cognition: Cognition impaired             OT - Cognition Comments: needs further assessment, pt seems to have limited insight into impairments. he followed most simple 1 step commands but unable to dual task or follow multistep directions.                 Following commands: Impaired Following commands impaired: Follows one step commands with increased time     Cueing  General Comments   Cueing Techniques: Verbal cues  BP: 120/97 seated EOB    Home Living Family/patient expects to be discharged to:: Private residence Living Arrangements: Spouse/significant other Available Help at Discharge: Family;Available PRN/intermittently Type of Home: Mobile home Home Access:  Ramped entrance     Home Layout: One level     Bathroom Shower/Tub: Walk-in shower;Tub only   Bathroom Toilet: Handicapped height Bathroom Accessibility: Yes   Home Equipment: Agricultural consultant (2 wheels);Cane - single point;Wheelchair - Engineer, technical sales - power;Electric scooter;Shower seat;Grab bars - toilet;Grab bars - tub/shower;Hand held shower head;BSC/3in1   Additional Comments: Information from prior encounter      Prior Functioning/Environment Prior Level of Function : Patient poor historian/Family not available             Mobility Comments: Mostly ind with no AD however uses SPC occasionally. ADLs Comments: Ind ADLs, light IADLs    OT Problem List: Decreased strength;Decreased range of motion;Decreased activity tolerance;Impaired balance (sitting and/or standing);Decreased cognition;Decreased safety awareness;Decreased knowledge of use of DME or AE;Decreased knowledge of precautions;Pain   OT Treatment/Interventions: Self-care/ADL training;Therapeutic exercise;DME and/or AE instruction;Therapeutic activities;Balance training;Patient/family education      OT Goals(Current goals can be found in the care plan section)   Acute Rehab OT Goals Patient Stated Goal: less pain OT Goal Formulation: With patient Time For Goal Achievement: 09/12/23 Potential to Achieve Goals: Good ADL Goals Pt Will Perform Grooming: with min assist Pt Will Perform Upper Body Dressing: with min assist;sitting Pt Will Transfer to Toilet: with max assist;stand pivot transfer;bedside commode Additional ADL Goal #1: Pt will complete bed mobility with mod A as a precursor to ADLs   OT Frequency:  Min 1X/week       AM-PAC OT 6 Clicks Daily Activity  Outcome Measure Help from another person eating meals?: Total Help from another person taking care of personal grooming?: A Lot Help from another person toileting, which includes using toliet, bedpan, or urinal?: Total Help from another  person bathing (including washing, rinsing, drying)?: A Lot Help from another person to put on and taking off regular upper body clothing?: A Lot Help from another person to put on and taking off regular lower body clothing?: A Lot 6 Click Score: 10   End of Session Nurse Communication: Mobility status  Activity Tolerance: Patient limited by pain;Patient limited by fatigue Patient left: in bed;with call bell/phone within reach  OT Visit Diagnosis: Unsteadiness on feet (R26.81);Other abnormalities of gait and mobility (R26.89);Muscle weakness (generalized) (M62.81);Pain                Time: 1342-1400 OT Time Calculation (min): 18 min Charges:  OT General Charges $OT Visit: 1 Visit OT Evaluation $OT Eval Moderate Complexity: 1 Mod  Veryl Gottron, OTR/L Acute Rehabilitation Services Office [phone] Secure Chat Communication Preferred   Starla Easterly 08/29/2023, 3:06 PM

## 2023-08-29 NOTE — Progress Notes (Signed)
 Received patient in bed.Alert and oriented x2,person and place. Consent verified. Patient is not on trach.  Access used : Right hd catheter that worked well.Dressing changed done today.  Duration of treatment : 3.5 hours.  Uf goal : 1.9 liters  Medicine given: Vancomycin  1 g=200 cc  Hemo comment:  He become agitated and restless ,midway to his treatment, wanted to terminate his treatment and go back into his room.Earlier at the start of the treatment ,he was asking for water ,explained to him that we could not allow it for him.HD staffs ,moistened his mouth and lips.Mouth moisturizer applied,seem helped for a while  but he quit his treatment on his last 40 minutes,thus 1.9 liters net uf from him.He just keep on shouting and cursing just to stop his treatment.  Hand off to the patient's nurse.Back into his room with stable condition via transporter.

## 2023-08-29 NOTE — Progress Notes (Signed)
 Patient is off the floor to IR for HD catheter removal

## 2023-08-30 ENCOUNTER — Encounter (HOSPITAL_COMMUNITY): Payer: Self-pay | Admitting: Internal Medicine

## 2023-08-30 DIAGNOSIS — C9000 Multiple myeloma not having achieved remission: Secondary | ICD-10-CM | POA: Diagnosis not present

## 2023-08-30 LAB — CULTURE, BLOOD (ROUTINE X 2)
Culture: NO GROWTH
Culture: NO GROWTH
Special Requests: ADEQUATE

## 2023-08-30 LAB — RENAL FUNCTION PANEL
Albumin: 2.1 g/dL — ABNORMAL LOW (ref 3.5–5.0)
Anion gap: 12 (ref 5–15)
BUN: 88 mg/dL — ABNORMAL HIGH (ref 6–20)
CO2: 24 mmol/L (ref 22–32)
Calcium: 8.4 mg/dL — ABNORMAL LOW (ref 8.9–10.3)
Chloride: 92 mmol/L — ABNORMAL LOW (ref 98–111)
Creatinine, Ser: 2.01 mg/dL — ABNORMAL HIGH (ref 0.61–1.24)
GFR, Estimated: 38 mL/min — ABNORMAL LOW (ref >60)
Glucose, Bld: 127 mg/dL — ABNORMAL HIGH (ref 70–99)
Phosphorus: 3.1 mg/dL (ref 2.5–4.6)
Potassium: 3.3 mmol/L — ABNORMAL LOW (ref 3.5–5.1)
Sodium: 128 mmol/L — ABNORMAL LOW (ref 135–145)

## 2023-08-30 LAB — GLUCOSE, CAPILLARY
Glucose-Capillary: 113 mg/dL — ABNORMAL HIGH (ref 70–99)
Glucose-Capillary: 153 mg/dL — ABNORMAL HIGH (ref 70–99)
Glucose-Capillary: 156 mg/dL — ABNORMAL HIGH (ref 70–99)
Glucose-Capillary: 158 mg/dL — ABNORMAL HIGH (ref 70–99)
Glucose-Capillary: 63 mg/dL — ABNORMAL LOW (ref 70–99)
Glucose-Capillary: 91 mg/dL (ref 70–99)

## 2023-08-30 MED ORDER — POTASSIUM CHLORIDE CRYS ER 10 MEQ PO TBCR: 10.0000 meq | EXTENDED_RELEASE_TABLET | Freq: Once | ORAL | Status: DC

## 2023-08-30 MED ORDER — POTASSIUM CHLORIDE 20 MEQ PO PACK
20.0000 meq | PACK | Freq: Once | ORAL | Status: AC
  Administered 2023-08-30: 20 meq
  Filled 2023-08-30: qty 1

## 2023-08-30 NOTE — Consult Note (Signed)
 Chief Complaint: Renal failure needing dialysis  Referring Provider(s): Shaune Delaine  Supervising Physician: Fernando Hoyer  Patient Status: [facility] - In-pt  History of Present Illness: Frank [name]. is a 58 y.o. 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  He had his HD catheter removed yesterday to allow for line holiday for bacteremia.  We are asked to replace his HD catheter on Monday.  Patient is Full Code  Past Medical History:  Diagnosis Date   Anxiety    Arthritis    Asthma    Bipolar disorder (HCC)    Current every day smoker    Depression    Diabetes mellitus, type II (HCC)    Dyspnea    History of kidney stones    History of pneumonia    Hyperlipidemia    Hypertension    Morbid obesity (HCC)    Sedentary lifestyle    Seizures (HCC)    last seizure 12 yrs ago, no current problem   Sleep apnea    uses CPAP nightly    Past Surgical History:  Procedure Laterality Date   CARDIAC CATHETERIZATION  2020   carpal tunel Left    COLONOSCOPY     HARDWARE REMOVAL Left 07/26/2022   Procedure: HARDWARE REMOVAL ELBOW;  Surgeon: Laneta Pintos, MD;  Location: [facility] OR;  Service: Orthopedics;  Laterality: Left;   IR FLUORO GUIDE CV LINE RIGHT  08/07/2023   IR GASTROSTOMY TUBE MOD SED  08/14/2023   IR REMOVAL TUN CV CATH W/O FL  08/29/2023   IR US  GUIDE VASC ACCESS RIGHT  08/07/2023   ORIF HUMERUS FRACTURE Left 01/16/2022   Procedure: OPEN REDUCTION INTERNAL FIXATION (ORIF) DISTAL HUMERUS FRACTURE;  Surgeon: Laneta Pintos, MD;  [facility]: [facility] OR;  Service: Orthopedics;  Laterality: Left;   ORIF HUMERUS FRACTURE Left 01/29/2023   Procedure: OPEN REDUCTION INTERNAL FIXATION (ORIF) DISTAL HUMERUS FRACTURE;  Surgeon: Laneta Pintos, MD;  [facility]: [facility];  Service: Orthopedics;  Laterality: Left;   OTHER SURGICAL HISTORY     R & L shoulder   OTHER SURGICAL HISTORY Right    Knee surgery x several   TOTAL HIP ARTHROPLASTY Left 12/26/2017   Procedure: LEFT  TOTAL HIP ARTHROPLASTY ANTERIOR APPROACH;  Surgeon: Arnie Lao, MD;  Location: [facility];  Service: Orthopedics;  Laterality: Left;   TOTAL KNEE ARTHROPLASTY Left 11/23/2021   Procedure: LEFT TOTAL KNEE ARTHROPLASTY;  Surgeon: Arnie Lao, MD;  Location: [facility];  Service: Orthopedics;  Laterality: Left;    Allergies: Morphine and codeine, Shellfish allergy, Betadine  [povidone iodine ], Bupropion, Chlorhexidine , Influenza vaccines, and Metoprolol   Medications: Prior to Admission medications   Medication Sig Start Date End Date Taking? Authorizing Provider  albuterol  (PROVENTIL  HFA;VENTOLIN  HFA) 108 (90 Base) MCG/ACT inhaler Inhale 2 puffs into the lungs every 6 (six) hours as needed for wheezing or shortness of breath.   Yes [provider]  albuterol  (PROVENTIL ) (2.5 MG/3ML) 0.083% nebulizer solution 2.5 mg every 2 (two) hours as needed for wheezing or shortness of breath.   Yes [provider]  aspirin  EC 81 MG tablet Take 81 mg by mouth daily. Swallow whole.   Yes [provider]  busPIRone  (BUSPAR ) 15 MG tablet Take 15 mg by mouth at bedtime.   Yes [provider]  celecoxib  (CELEBREX ) 200 MG capsule Take 200 mg by mouth daily.   Yes [provider]  clonazePAM  (KLONOPIN ) 1 MG tablet Take 1 mg by mouth  once as needed for anxiety.   Yes [provider]  cyclobenzaprine  (FLEXERIL ) 5 MG tablet Take 1 tablet (5 mg total) by mouth 3 (three) times daily as needed for muscle spasms. Patient taking differently: Take 5 mg by mouth daily as needed for muscle spasms. 01/30/23  Yes Versie Gores, PA-C  divalproex  (DEPAKOTE ) 500 MG DR tablet Take 500 mg by mouth 2 (two) times daily.   Yes [provider]  EPINEPHrine  0.3 mg/0.3 mL IJ SOAJ injection Inject 0.3 mg into the muscle as needed for anaphylaxis. 08/28/21  Yes [provider]  esomeprazole (NEXIUM) 40 MG capsule Take 40 mg by mouth in the morning.   Yes  [provider]  Fluticasone -Salmeterol (ADVAIR) 500-50 MCG/DOSE AEPB Inhale 2 puffs into the lungs in the morning.   Yes [provider]  furosemide  (LASIX ) 40 MG tablet Take 40 mg by mouth daily as needed for fluid or edema.   Yes [provider]  gabapentin  (NEURONTIN ) 300 MG capsule Take 300-600 mg by mouth See admin instructions. 300 mg in the morning, 600 mg at bedtime   Yes [provider]  hydrALAZINE  (APRESOLINE ) 10 MG tablet Take 10 mg by mouth in the morning and at bedtime.   Yes [provider]  hydrochlorothiazide  (HYDRODIURIL ) 25 MG tablet Take 25 mg by mouth daily.   Yes [provider]  lisinopril  (ZESTRIL ) 40 MG tablet Take 40 mg by mouth daily. 03/25/23  Yes [provider]  metFORMIN  (GLUCOPHAGE ) 1000 MG tablet Take 1,000 mg by mouth 2 (two) times daily with a meal.   Yes [provider]  montelukast  (SINGULAIR ) 10 MG tablet Take 10 mg by mouth at bedtime.   Yes [provider]  OVER THE COUNTER MEDICATION Take 400 mg by mouth at bedtime. Burdock root   Yes [provider]  oxyCODONE  (ROXICODONE ) 5 MG immediate release tablet Take 1 tablet (5 mg total) by mouth every 4 (four) hours as needed for severe pain (pain score 7-10). 01/30/23  Yes McClung, Sarah A, PA-C  PARoxetine  (PAXIL ) 40 MG tablet Take 40 mg by mouth every morning.   Yes [provider]  spironolactone  (ALDACTONE ) 25 MG tablet Take 25 mg by mouth at bedtime. 10/07/21  Yes [provider]  tamsulosin  (FLOMAX ) 0.4 MG CAPS capsule Take 0.4 mg by mouth at bedtime. 10/07/21  Yes [provider]  testosterone  cypionate (DEPOTESTOSTERONE CYPIONATE) 200 MG/ML injection Inject 0.5 mLs (100 mg total) into the muscle every 14 (fourteen) days. 09/17/16  Yes Nida, Gebreselassie W, MD  verapamil  (CALAN -SR) 120 MG CR tablet Take 120 mg by mouth daily.   Yes [provider]  Vitamin D , Ergocalciferol , (DRISDOL ) 1.25  MG (50000 UNIT) CAPS capsule Take 1 capsule (50,000 Units total) by mouth every Thursday. Patient taking differently: Take 50,000 Units by mouth once a week. Tuesday 02/06/23  Yes Versie Gores, PA-C  buPROPion (WELLBUTRIN XL) 150 MG 24 hr tablet Take 150 mg by mouth daily. Patient not taking: Reported on 07/04/2023    [provider]     Family History  Problem Relation Age of Onset   Diabetes Mother    Hypertension Mother    Heart attack Mother    Osteoporosis Mother    Diabetes Father    Hypertension Father    Heart attack Father    Diabetes Sister    Hypertension Sister    Diabetes Brother    Hypertension Brother     Social History  Socioeconomic History   Marital status: Married    Spouse name: Not on file   Number of children: Not on file   Years of education: Not on file   Highest education level: Not on file  Occupational History   Not on file  Tobacco Use   Smoking status: Every Day    Current packs/day: 0.50    Average packs/day: 0.5 packs/day for 25.0 years (12.5 ttl pk-yrs)    Types: Cigarettes   Smokeless tobacco: Never  Vaping Use   Vaping status: Never Used  Substance and Sexual Activity   Alcohol  use: No   Drug use: Yes    Types: Marijuana    Comment: 01/28/2023   Sexual activity: Yes  Other Topics Concern   Not on file  Social History Narrative   Not on file   Social Drivers of Health   Financial Resource Strain: Not on file  Food Insecurity: No Food Insecurity (07/05/2023)   Hunger Vital Sign    Worried About Running Out of Food in the Last Year: Never true    Ran Out of Food in the Last Year: Never true  Transportation Needs: No Transportation Needs (07/05/2023)   PRAPARE - Administrator, Civil Service (Medical): No    Lack of Transportation (Non-Medical): No  Physical Activity: Not on file  Stress: Not on file  Social Connections: Not on file      Review of Systems  Unable to perform ROS: Patient nonverbal     Vital Signs: BP (!) 138/92 (BP Location: Right Arm)   Pulse 92   Temp 97.9 F (36.6 C) (Oral)   Resp 18   Ht 6' 0.01 (1.829 m)   Wt 277 lb 5.4 oz (125.8 kg)   SpO2 97%   BMI 37.61 kg/m   Advance Care Plan: The advanced care place/surrogate decision maker was discussed at the time of visit and the patient did not wish to discuss or was not able to name a surrogate decision maker or provide an advance care plan.  Physical Exam Constitutional:      Appearance: He is ill-appearing.  HENT:     Head: Normocephalic and atraumatic.  Neck:     Comments: Tracheostomy  Cardiovascular:     Rate and Rhythm: Normal rate and regular rhythm.  Pulmonary:     Effort: Pulmonary effort is normal. No respiratory distress.   Skin:    General: Skin is warm and dry.   Neurological:     Mental Status: He is alert.     Imaging: IR Removal Tun Cv Cath W/O FL Result Date: 08/29/2023 INDICATION: Bacteremia with request for removal of tunneled catheter for line holiday. EXAM: REMOVAL OF TUNNELED HEMODIALYSIS CATHETER MEDICATIONS: None COMPLICATIONS: None immediate. PROCEDURE: Informed written consent was obtained from the patient following an explanation of the procedure, risks, benefits and alternatives to treatment. A time out was performed prior to the initiation of the procedure. Alcohol  was used to prep the patient's right neck, chest and existing catheter (patient has allergy to ChloraPrep and Betadine ). Using only gentle traction the catheter was removed intact. Hemostasis was obtained with manual compression. A dressing was placed. The patient tolerated the procedure well without immediate post procedural complication. IMPRESSION: Successful removal of tunneled dialysis catheter. Procedure performed by: [name], PA-C Electronically Signed   By: Erica Hau M.D.   On: 08/29/2023 16:11   DG Swallowing Func-Speech Pathology Result Date: 08/25/2023 Table formatting from the original result  was  not included. Images from the original result were not included. Modified Barium Swallow Study Patient Details Name: Jeyson Deshotel. MRN: [id number] Date of Birth: 12/16/1965 Today's Date: 08/25/2023 HPI/PMH: HPI: 58 y.o. male transferred from [facility] 07/03/23 to [facility] for management of newly diagnosed plasma cell myeloma with weakness and AMS. Pt also with AKI and metabolic encephalopathy. 5/10 transfer to [facility] for iHD.Bedside swallow eval 4/18 wiht recs for NPO due to somnolence. 4/22 Intubated and chemo initiated. 4/23-5/10 CRRT. 5/6 IVIG started. 5/8 trach. 5/12 iHD initiated. 5/18 inability to oxygenate through trach, intubated and trach revision. PMHx:Lt THA, carpal tunnel syndrome, Lt TKA, Lt humerus fx, PAF, HTN, HLD, bipolar disorder, obesity, T2DM Clinical Impression: Pt demonstrates a moderate oropharyngeal dysphagia with a DIGEST score of 2. Pt evaluated with PMSV in place, pt able to follow about 50% of commands to cough or throat clear, requires total assisted feeding. Is not initiating well, very flat affect. Pt initiates swallows in the pyriform sinuses with spillage of bolus into the vestible with sensed aspiration of thin liquids and silent aspiraiton of nectar thick liquids in only the final attempt. There is base of tongue and vallecular residue that penetrated the vestibule without pt initiation to clear.  Suspect base of tongue weakness, but also noted very bumpy irregular tissue on the velar tissue that would be contributing to residue (see screen shot below). He does respond occasionally  to verbal cues for a throat clear and second swallow. Pt to remain NPO, but would benefit from ice chips throughout the day and frequent trials of nectar thick liquids after oral care with SLP. DIGEST Swallow Severity Rating*  Safety: 2  Efficiency:2  Overall Pharyngeal Swallow Severity: 2 1: mild; 2: moderate; 3: severe; 4: profound *The Dynamic Imaging Grade of Swallowing Toxicity is standardized for  the head and neck cancer population, however, demonstrates promising clinical applications across populations to standardize the clinical rating of pharyngeal swallow safety and severity. Factors that may increase risk of adverse event in presence of aspiration Roderick Civatte & Jessy Morocco 2021): Factors that may increase risk of adverse event in presence of aspiration Roderick Civatte & Jessy Morocco 2021): Reduced cognitive function; Frail or deconditioned; Dependence for feeding and/or oral hygiene; Inadequate oral hygiene; Weak cough; Limited mobility; Poor general health and/or compromised immunity Recommendations/Plan: Swallowing Evaluation Recommendations Swallowing Evaluation Recommendations Recommendations: Ice chips PRN after oral care Medication Administration: Via alternative means Oral care recommendations: Oral care before ice chips/water  Treatment Plan Treatment Plan Treatment recommendations: Therapy as outlined in treatment plan below Recommendations Recommendations for follow up therapy are one component of a multi-disciplinary discharge planning process, led by the attending physician.  Recommendations may be updated based on patient status, additional functional criteria and insurance authorization. Assessment: Orofacial Exam: Orofacial Exam Oral Cavity - Dentition: Poor condition; Missing dentition Anatomy: Anatomy: WFL Boluses Administered: Boluses Administered Boluses Administered: Mildly thick liquids (Level 2, nectar thick); Thin liquids (Level 0); Moderately thick liquids (Level 3, honey thick); Puree  Oral Impairment Domain: Oral Impairment Domain Lip Closure: No labial escape Tongue control during bolus hold: Not tested Bolus preparation/mastication: -- (NT) Bolus transport/lingual motion: Repetitive/disorganized tongue motion Oral residue: Trace residue lining oral structures Location of oral residue : Tongue Initiation of pharyngeal swallow : Pyriform sinuses  Pharyngeal Impairment Domain: Pharyngeal Impairment  Domain Soft palate elevation: No bolus between soft palate (SP)/pharyngeal wall (PW) Laryngeal elevation: Complete superior movement of thyroid cartilage with complete approximation of arytenoids to epiglottic petiole Anterior hyoid excursion: Complete anterior movement  Epiglottic movement: Complete inversion Laryngeal vestibule closure: Incomplete, narrow column air/contrast in laryngeal vestibule Pharyngeal stripping wave : Present - complete Pharyngoesophageal segment opening: Complete distension and complete duration, no obstruction of flow Tongue base retraction: Trace column of contrast or air between tongue base and PPW Pharyngeal residue: Collection of residue within or on pharyngeal structures Location of pharyngeal residue: Tongue base; Valleculae; Pyriform sinuses  Esophageal Impairment Domain: No data recorded Pill: No data recorded Penetration/Aspiration Scale Score: Penetration/Aspiration Scale Score 1.  Material does not enter airway: Mildly thick liquids (Level 2, nectar thick) 3.  Material enters airway, remains ABOVE vocal cords and not ejected out: Moderately thick liquids (Level 3, honey thick); Mildly thick liquids (Level 2, nectar thick); Puree 8.  Material enters airway, passes BELOW cords without attempt by patient to eject out (silent aspiration) : Mildly thick liquids (Level 2, nectar thick) Compensatory Strategies: No data recorded  General Information: Caregiver present: No  Diet Prior to this Study: NPO; Cortrak/Small bore NG tube   Temperature : Normal   Respiratory Status: WFL   Supplemental O2: Trach Collar   History of Recent Intubation: No  Behavior/Cognition: Requires cueing; Alert Self-Feeding Abilities: Dependent for feeding Baseline vocal quality/speech: Not observed Volitional Cough: Able to elicit Volitional Swallow: Able to elicit No data recorded Goal Planning: Prognosis for improved oropharyngeal function: Good Barriers to Reach Goals: Cognitive deficits No data recorded  Patient/Family Stated Goal: not able to state No data recorded Pain: Pain Assessment Breathing: 0 Negative Vocalization: 0 Facial Expression: 0 Body Language: 0 Consolability: 0 PAINAD Score: 0 End of Session: Start Time:SLP Start Time (ACUTE ONLY): 1400 Stop Time: SLP Stop Time (ACUTE ONLY): 1415 Time Calculation:SLP Time Calculation (min) (ACUTE ONLY): 15 min Charges: SLP Evaluations $ SLP Speech Visit: 1 Visit SLP Evaluations $MBS Swallow: 1 Procedure $Swallowing Treatment: 1 Procedure $Speech Treatment for Individual: 1 Procedure SLP visit diagnosis: SLP Visit Diagnosis: Dysphagia, oropharyngeal phase (R13.12) Past Medical History: Past Medical History: Diagnosis Date  Anxiety   Arthritis   Asthma   Bipolar disorder (HCC)   Current every day smoker   Depression   Diabetes mellitus, type II (HCC)   Dyspnea   History of kidney stones   History of pneumonia   Hyperlipidemia   Hypertension   Morbid obesity (HCC)   Sedentary lifestyle   Seizures (HCC)   last seizure 12 yrs ago, no current problem  Sleep apnea   uses CPAP nightly Past Surgical History: Past Surgical History: Procedure Laterality Date  CARDIAC CATHETERIZATION  2020  carpal tunel Left   COLONOSCOPY    HARDWARE REMOVAL Left 07/26/2022  Procedure: HARDWARE REMOVAL ELBOW;  Surgeon: Laneta Pintos, MD;  [facility];  Service: Orthopedics;  Laterality: Left;  IR FLUORO GUIDE CV LINE RIGHT  08/07/2023  IR GASTROSTOMY TUBE MOD SED  08/14/2023  IR US  GUIDE VASC ACCESS RIGHT  08/07/2023  ORIF HUMERUS FRACTURE Left 01/16/2022  Procedure: OPEN REDUCTION INTERNAL FIXATION (ORIF) DISTAL HUMERUS FRACTURE;  Surgeon: Laneta Pintos, MD;  [facility];  Service: Orthopedics;  Laterality: Left;  ORIF HUMERUS FRACTURE Left 01/29/2023  Procedure: OPEN REDUCTION INTERNAL FIXATION (ORIF) DISTAL HUMERUS FRACTURE;  Surgeon: Laneta Pintos, MD;  [facility]: [facility];  Service: Orthopedics;  Laterality: Left;  OTHER SURGICAL HISTORY    R & L shoulder  OTHER SURGICAL HISTORY  Right   Knee surgery x several  TOTAL HIP ARTHROPLASTY Left 12/26/2017  Procedure: LEFT TOTAL HIP ARTHROPLASTY ANTERIOR APPROACH;  Surgeon: Norberto Bear  Y, MD;  Location: [facility];  Service: Orthopedics;  Laterality: Left;  TOTAL KNEE ARTHROPLASTY Left 11/23/2021  Procedure: LEFT TOTAL KNEE ARTHROPLASTY;  Surgeon: Arnie Lao, MD;  Location: [facility];  Service: Orthopedics;  Laterality: Left; DeBlois, Hardin Leys 08/25/2023, 3:02 PM  DG CHEST PORT 1 VIEW Result Date: 08/23/2023 CLINICAL DATA:  Fever. EXAM: PORTABLE CHEST 1 VIEW COMPARISON:  Chest radiograph dated 08/03/2023. FINDINGS: Tracheostomy with tip approximately 2 cm above the carina. Right IJ catheter with tip over the right atrium. There is mild cardiomegaly with mild central vascular congestion. No focal consolidation, pleural effusion or pneumothorax. No acute osseous pathology. IMPRESSION: Mild cardiomegaly with mild central vascular congestion. No focal consolidation. Electronically Signed   By: Angus Bark M.D.   On: 08/23/2023 09:35   IR GASTROSTOMY TUBE MOD SED Result Date: 08/15/2023 INDICATION: 58 year old male with history of dysphagia requiring percutaneous gastrostomy access. EXAM: PERC PLACEMENT GASTROSTOMY MEDICATIONS: None. ANESTHESIA/SEDATION: Versed  1 mg IV; Fentanyl  50 mcg IV Moderate Sedation Time:  11 The patient was continuously monitored during the procedure by the interventional radiology nurse under my direct supervision. CONTRAST:  20mL OMNIPAQUE  IOHEXOL  300 MG/ML SOLN - administered into the gastric lumen. FLUOROSCOPY TIME:  Three mGy COMPLICATIONS: None immediate. PROCEDURE: Informed written consent was obtained from the patient after a thorough discussion of the procedural risks, benefits and alternatives. All questions were addressed. Maximal Sterile barrier Technique was utilized including caps, mask, sterile gowns, sterile gloves, sterile drape, hand hygiene and skin antiseptic. A timeout was  performed prior to the initiation of the procedure. The patient was placed on the procedure table in the supine position. Pre-procedure abdominal film confirmed visualization of the transverse colon. An angled 5-French catheter was passed through the nares into the stomach. The patient was prepped and draped in usual sterile fashion. The stomach was insufflated with air via the indwelling nasogastric tube. Under fluoroscopy, a puncture site was selected and local analgesia achieved with 1% lidocaine  infiltrated subcutaneously. Under fluoroscopic guidance, a gastropexy needle was passed into the stomach and the T-bar suture was released. Entry into the stomach was confirmed with fluoroscopy, aspiration of air, and injection of contrast material. This was repeated with an additional gastropexy suture (for a total of 2 fasteners). At the center of these gastropexy sutures, a dermatotomy was performed. An 18 gauge needle was passed into the stomach at the site of this dermatotomy, and position within the gastric lumen again confirmed under fluoroscopy using aspiration of air and contrast injection. An Amplatz guidewire was passed through this needle and intraluminal placement within the stomach was confirmed by fluoroscopy. The needle was removed. Over the guidewire, the percutaneous tract was dilated using a 10 mm non-compliant balloon. The balloon was deflated, then pushed into the gastric lumen followed in concert by the 20 Fr gastrostomy tube. The retention balloon of the percutaneous gastrostomy tube was inflated with 10 mL of sterile water . The tube was withdrawn until the retention balloon was at the edge of the gastric lumen. The external bumper was brought to the abdominal wall. Contrast was injected through the gastrostomy tube, confirming intraluminal positioning. The patient tolerated the procedure well without any immediate post-procedural complications. IMPRESSION: Technically successful placement of 20 Fr  gastrostomy tube. Creasie Doctor, MD [facility] Radiology Electronically Signed   By: Creasie Doctor M.D.   On: 08/15/2023 07:42   DG Abd 1 View Result Date: 08/14/2023 CLINICAL DATA:  Pre gastrostomy evaluation EXAM: ABDOMEN - 1  VIEW COMPARISON:  None Available. FINDINGS: Scattered large and small bowel gas is noted. Feeding catheter is noted within the distal stomach. Dialysis catheter is noted with the tip in the right atrium. No free air seen. No obstructive changes are noted. Prior left hip replacement is seen. IMPRESSION: No acute abnormality noted.  Feeding catheter in the distal stomach. Electronically Signed   By: Violeta Grey M.D.   On: 08/14/2023 09:27   EEG adult Result Date: 08/10/2023 Arleene Lack, MD     08/10/2023  6:07 AM Patient Name: Antoinette Batman. MRN: [id number] Epilepsy Attending: Arleene Lack Referring Physician/Provider: Vada Garibaldi, MD Date: 08/09/2023 Duration: 24.42 mins Patient history: 58yo M with ams. EEG to evaluate for seizure Level of alertness: Awake/ lethargic AEDs during EEG study: None Technical aspects: This EEG study was done with scalp electrodes positioned according to the 10-20 International system of electrode placement. Electrical activity was reviewed with band pass filter of 1-70Hz , sensitivity of 7 uV/mm, display speed of 41mm/sec with a 60Hz  notched filter applied as appropriate. EEG data were recorded continuously and digitally stored.  Video monitoring was available and reviewed as appropriate. Description: EEG showed continuous generalized 5 to 6 Hz theta slowing. Patient was noted to have episodes of brief sudden head jerking every few seconds. Concomitant EEG before, during and after the event did not show any EEG changes suggest seizure.  Hyperventilation and photic stimulation were not performed.   EEG was technically difficult due to significant eye flutter and movement artifact. ABNORMALITY -  Continuous slow, generalized IMPRESSION: This technically difficult study is suggestive of moderate diffuse encephalopathy. No seizures or epileptiform discharges were seen throughout the recording. Patient was noted to have episodes of brief sudden head jerking every few seconds without concomitant EEG change. These events were most likely non epileptic. Priyanka O Yadav   IR Fluoro Guide CV Line Right Result Date: 08/07/2023 INDICATION: 58 year old male with history of renal failure requiring central venous access for hemodialysis. EXAM: TUNNELED CENTRAL VENOUS HEMODIALYSIS CATHETER PLACEMENT WITH ULTRASOUND AND FLUOROSCOPIC GUIDANCE MEDICATIONS: Ancef  2 gm IV . The antibiotic was given in an appropriate time interval prior to skin puncture. ANESTHESIA/SEDATION: Moderate (conscious) sedation was employed during this procedure. A total of Versed  1 mg and Fentanyl  25 mcg was administered intravenously. Moderate Sedation Time: 12 minutes. The patient's level of consciousness and vital signs were monitored continuously by radiology nursing throughout the procedure under my direct supervision. FLUOROSCOPY TIME:  Three mGy reference air kerma COMPLICATIONS: None immediate. PROCEDURE: Informed written consent was obtained from the patient after a discussion of the risks, benefits, and alternatives to treatment. Questions regarding the procedure were encouraged and answered. The right neck and chest were prepped with chlorhexidine  in a sterile fashion, and a sterile drape was applied covering the operative field. Maximum barrier sterile technique with sterile gowns and gloves were used for the procedure. A timeout was performed prior to the initiation of the procedure. After creating a small venotomy incision, a 21 gauge micropuncture kit was utilized to access the internal jugular vein. Real-time ultrasound guidance was utilized for vascular access including the acquisition of a permanent ultrasound image documenting  patency of the accessed vessel. A Rosen wire was advanced to the level of the IVC and the micropuncture sheath was exchanged for an 8 Fr dilator. A 14.5 French tunneled hemodialysis catheter measuring 23 cm from tip to cuff was tunneled in a retrograde fashion from the anterior chest wall to the venotomy  incision. Serial dilation was then performed an a peel-away sheath was placed. The catheter was then placed through the peel-away sheath with the catheter tip ultimately positioned within the right atrium. Final catheter positioning was confirmed and documented with a spot radiographic image. The catheter aspirates and flushes normally. The catheter was flushed with appropriate volume heparin  dwells. The catheter exit site was secured with a 0-Silk retention suture. The venotomy incision was closed with Dermabond. Sterile dressings were applied. The patient tolerated the procedure well without immediate post procedural complication. IMPRESSION: Successful placement of 23 cm tip to cuff tunneled hemodialysis catheter via the right internal jugular vein with catheter tip terminating within the right atrium. The catheter is ready for immediate use. Creasie Doctor, MD [facility] Radiology Electronically Signed   By: Creasie Doctor M.D.   On: 08/07/2023 16:25   IR US  Guide Vasc Access Right Result Date: 08/07/2023 INDICATION: 58 year old male with history of renal failure requiring central venous access for hemodialysis. EXAM: TUNNELED CENTRAL VENOUS HEMODIALYSIS CATHETER PLACEMENT WITH ULTRASOUND AND FLUOROSCOPIC GUIDANCE MEDICATIONS: Ancef  2 gm IV . The antibiotic was given in an appropriate time interval prior to skin puncture. ANESTHESIA/SEDATION: Moderate (conscious) sedation was employed during this procedure. A total of Versed  1 mg and Fentanyl  25 mcg was administered intravenously. Moderate Sedation Time: 12 minutes. The patient's level of consciousness and vital  signs were monitored continuously by radiology nursing throughout the procedure under my direct supervision. FLUOROSCOPY TIME:  Three mGy reference air kerma COMPLICATIONS: None immediate. PROCEDURE: Informed written consent was obtained from the patient after a discussion of the risks, benefits, and alternatives to treatment. Questions regarding the procedure were encouraged and answered. The right neck and chest were prepped with chlorhexidine  in a sterile fashion, and a sterile drape was applied covering the operative field. Maximum barrier sterile technique with sterile gowns and gloves were used for the procedure. A timeout was performed prior to the initiation of the procedure. After creating a small venotomy incision, a 21 gauge micropuncture kit was utilized to access the internal jugular vein. Real-time ultrasound guidance was utilized for vascular access including the acquisition of a permanent ultrasound image documenting patency of the accessed vessel. A Rosen wire was advanced to the level of the IVC and the micropuncture sheath was exchanged for an 8 Fr dilator. A 14.5 French tunneled hemodialysis catheter measuring 23 cm from tip to cuff was tunneled in a retrograde fashion from the anterior chest wall to the venotomy incision. Serial dilation was then performed an a peel-away sheath was placed. The catheter was then placed through the peel-away sheath with the catheter tip ultimately positioned within the right atrium. Final catheter positioning was confirmed and documented with a spot radiographic image. The catheter aspirates and flushes normally. The catheter was flushed with appropriate volume heparin  dwells. The catheter exit site was secured with a 0-Silk retention suture. The venotomy incision was closed with Dermabond. Sterile dressings were applied. The patient tolerated the procedure well without immediate post procedural complication. IMPRESSION: Successful placement of 23 cm tip to cuff  tunneled hemodialysis catheter via the right internal jugular vein with catheter tip terminating within the right atrium. The catheter is ready for immediate use. Creasie Doctor, MD [facility] Radiology Electronically Signed   By: Creasie Doctor M.D.   On: 08/07/2023 16:25   DG Humerus Left Result Date: 08/05/2023 CLINICAL DATA:  Fracture follow-up. EXAM: LEFT HUMERUS - 2+ VIEW COMPARISON:  Elbow radiograph 01/29/2023 FINDINGS: Lateral plate and screw fixation of the distal humerus. Progressive decreased bone density of the distal humerus involving the distal 1/3 with loss of bone stock, as well as also loss of cortical margins medially. Remote distal humeral fracture with nonunion. Soft tissue edema seen laterally. The proximal humerus is not entirely included in the field of view, however diffusely decreased bone density is seen proximally. IMPRESSION: Lateral plate and screw fixation of the distal humerus. Progressive decreased bone density of the distal humerus with loss of bone stock, as well as cortical margins medially. This is greater than typically seen with disuse and raises suspicion for infection or underlying lytic bone lesion. Clinical correlation is advised. Electronically Signed   By: Chadwick Colonel M.D.   On: 08/05/2023 13:04   DG Chest Port 1 View Result Date: 08/03/2023 CLINICAL DATA:  Status post tracheostomy. EXAM: PORTABLE CHEST 1 VIEW COMPARISON:  Radiograph 07/26/2023 FINDINGS: Tracheostomy tube tip projects over the thoracic inlet. Weighted enteric tube tip below the diaphragm not included in the field of view. Right internal jugular central line tip projects over the brachiocephalic/SVC confluence. Development of subcutaneous emphysema about both supraclavicular regions and lateral chest walls. There is pneumomediastinum. No convincing pneumothorax. Stable heart size and mediastinal contours. Improvement in patchy bilateral lung  opacities, greatest residual on the left. IMPRESSION: 1. Tracheostomy tube tip projects over the thoracic inlet. 2. Development of subcutaneous emphysema about both supraclavicular regions and lateral chest walls. Pneumomediastinum. No convincing pneumothorax. 3. Improvement in patchy bilateral lung opacities, greatest residual on the left. Electronically Signed   By: Chadwick Colonel M.D.   On: 08/03/2023 16:33   CT FEMUR RIGHT W CONTRAST Result Date: 08/01/2023 CLINICAL DATA:  Right thigh hematoma EXAM: CT OF THE LOWER RIGHT EXTREMITY WITH CONTRAST TECHNIQUE: Multidetector CT imaging of the lower right extremity was performed according to the standard protocol following intravenous contrast administration. RADIATION DOSE REDUCTION: This exam was performed according to the departmental dose-optimization program which includes automated exposure control, adjustment of the mA and/or kV according to patient size and/or use of iterative reconstruction technique. CONTRAST:  75mL OMNIPAQUE  IOHEXOL  350 MG/ML SOLN COMPARISON:  None Available. FINDINGS: Bones/Joint/Cartilage Degenerative changes seen at the lumbosacral junction. Left total hip arthroplasty partially visualized. 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 No acute fracture or dislocation. Ligaments Suboptimally assessed by CT. Muscles and Tendons There is marked fatty atrophy of the semimembranosus and biceps femoris as well as the rectus femoris. Milder atrophy involving the vastus intermedius and vastus lateralis. Iliopsoas, gluteal, hamstring, quadriceps, patellar tendons appear intact. Soft tissues Ovoid hyperdense collection is seen within the subcutaneous soft tissues of the anterior right thigh measuring 12.1 x 30.2 x 7.6 cm in greatest dimension compatible with a subcutaneous hematoma. No active extravasation identified. The collection communicates superiorly with a second  similar appearing collection within the subcutaneous fat of the right gluteal region measuring 8.4 x 10.4 x 22.2 cm in keeping with an additional subcutaneous hematoma. Again, no active extravasation identified within this collection. No free intraperitoneal fluid noted within the visualized pelvis. No extraperitoneal hemorrhage noted within the deep pelvis. IMPRESSION: 1. 12.1 x 30.2 x 7.6 cm subcutaneous hematoma within the anterior right thigh. 2. 8.4 x 10.4 x 22.2 cm subcutaneous hematoma within the right gluteal region. 3. No active extravasation identified. 4. No acute fracture or dislocation. 5. 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 6. Moderate degenerative arthritis of  the right hip. 7. Marked fatty atrophy of the semimembranosus and biceps femoris as well as the rectus femoris. Milder atrophy involving the vastus intermedius and vastus lateralis. Electronically Signed   By: Worthy Heads M.D.   On: 08/01/2023 23:52    Labs:  CBC: Recent Labs    08/25/23 0419 08/26/23 0433 08/27/23 0442 08/28/23 0515  WBC 3.5* 3.2* 2.8* 3.3*  HGB 9.5* 9.0* 9.0* 9.3*  HCT 30.2* 28.1* 28.0* 28.8*  PLT 130* 110* 106* 103*    COAGS: Recent Labs    07/05/23 2227 07/07/23 2030 07/10/23 0411 07/23/23 0416 07/24/23 0421 07/25/23 0409 07/26/23 0423 08/13/23 0908  INR 2.1* 1.9*  --   --   --   --   --  1.0  APTT  --  44*   < > 39* 45* 34 36  --    < > = values in this interval not displayed.    BMP: Recent Labs    08/26/23 0433 08/27/23 0442 08/28/23 0515 08/29/23 0752  NA 131* 131* 131* 128*  K 3.8 3.3* 3.5 3.7  CL 95* 96* 94* 92*  CO2 24 27 26 23   GLUCOSE 107* 96 86 115*  BUN 89* 65* 60* 100*  CALCIUM  8.3* 8.6* 8.5* 8.3*  CREATININE 2.07* 1.74* 1.67* 2.54*  GFRNONAA 36* 45* 47* 28*    LIVER FUNCTION TESTS: Recent Labs    08/17/23 0351 08/18/23 0530 08/18/23 0531 08/20/23 0527 08/21/23 0603 08/26/23 0433 08/27/23 0442 08/28/23 0515  08/29/23 0752  BILITOT 0.7 0.7  --  0.6  --   --   --  0.6  --   AST 13* 13*  --  20  --   --   --  21  --   ALT 5 8  --  18  --   --   --  16  --   ALKPHOS 45 40  --  41  --   --   --  55  --   PROT 8.1 7.5  --  7.1  --   --   --  6.5  --   ALBUMIN  2.6* 2.4*   < > 2.3*   < > 2.4* 2.3* 2.4*  2.4* 2.1*   < > = values in this interval not displayed.    TUMOR MARKERS: No results for input(s): AFPTM, CEA, CA199, CHROMGRNA in the last 8760 hours.  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  Bacteremia treated - will proceed with placement of a new tunneled hemodialysis catheter on Monday by Dr. Nereida Banning.  Risks and benefits discussed with the patient's wife including, but not limited to bleeding, infection, vascular injury, pneumothorax which may require chest tube placement, air embolism or even death  All  questions were answered, patient is agreeable to proceed. Consent signed and in IR.   Electronically Signed: Connor Deiters, PA-C   08/30/2023, 11:41 AM      I spent a total of    15 Minutes in face to face in clinical consultation, greater than 50% of which was counseling/coordinating care for HD cath placement.

## 2023-08-30 NOTE — Progress Notes (Signed)
 This is probably the best I have seen Mr. Hinners.  He was able to talk to me a little bit.  He was quite alert.  He is still getting dialysis.  I know that IR is following him for his catheter.  I know that Nephrology is following him.  He has chemotherapy a couple days ago.  He tolerated this quite well.  He is still getting tube feeds.  Again, hopefully, speech pathology will be able to work with him and he will be able to eat.  Unfortunately, there are no labs back yet.  Physical therapy is working really hard with him.  Hopefully, he will be able to get about better.  It sounds like he probably will have to go to skilled nursing or rehab once he is medically able.  We will have to see what the light chains are.  It still may be a little bit early to check these.  I know he has bacteremia which has been treated.  This is a Staph epi bacteremia.  His vital signs are stable.  Temperature 99.  Pulse 92.  Blood pressure 129/81.  His lungs sound great bilaterally.  He has good air movement bilaterally.  Cardiac exam regular rate and rhythm.  Abdomen is soft.  Bowel sounds are present.  G-tube is intact.  Extremities she has a compression devices on his legs.  He has a brace on his left arm.  Neurological exam really shows improvement.   I am just happy that he is doing better.  Hopefully, this is a sign that the chemotherapy is also helping him.  Maybe, at some point, he will be able to get off the dialysis.  He is due for another cycle of treatment next week.  I think for right now, his protocol is 3 weeks on and 1 week off.  We can always make adjustments to his protocol if he does get discharged.  Again he served our country.  He was in the [employer].  I want to make sure that we serve him as he is a true American hero for sacrificing himself  for our freedom.   [name], MD  Hebrews 12:12

## 2023-08-30 NOTE — Progress Notes (Signed)
 PROGRESS NOTE    Frank [name].  XWR:[id number] DOB: [birth year] DOA: 07/03/2023 PCP: Serita Danes, MD   Brief Narrative: 58 year old PMH OSA CPAP, pathological fracture and multiple ORIF left Humerus likely due to multiple myeloma, A-fib not on anticoagulation, hyperlipidemia, tobacco use disorder who was  feeling weak for 1 week, admitted  to [facility] with concern of sepsis, community-acquired pneumonia and acute encephalopathy.  Patient was found to have lytic lesions,  he was transferred to [facility] for  oncology treatment.  In the meantime he developed encephalopathy, acute renal failure.  He remained in the ICU for 36 days.  Underwent tracheostomy. He was also started on HD after  CRRT.  Subsequently PEG tube placed 5/29.  Significant events 4/17-Admitted to medical floor with oncology consultation to treat multiple myeloma. 4/19-transferred to ICU intubated, encephalopathic. 4/20-A-fib with RVR. Started heparin . Groin and biopsy site hematoma requiring multiple transfusions. EEG 4/25 >> moderate to severe encephalopathy without any seizures or epileptiform discharges. 4/29 Received Velcade  and Cytoxan  chemotherapy-intent palliative? 5/2 LP and ETT exchange  5/8 Trach placed. Some post op bleeding improved with thrombi-pad 5/10 Voriconazole  added back based on CT Chest findings 5/13 more alert and able to follow some commands. 5/23, transferred to floor. 5/29-PEG placed 5/31 chemotherapy changed to Sarclisa /Kyprolis /Cytoxan . 6/13 plan to remove HD cathter for holiday line 6/16 plan to place new HD catheter.     Assessment & Plan:   Principal Problem:   Multiple myeloma not having achieved remission (HCC) Active Problems:   AKI (acute kidney injury) (HCC)   Acute hypoxemic respiratory failure (HCC)   Goals of care, counseling/discussion   Acute metabolic encephalopathy   Lobar pneumonia, unspecified organism (HCC)   Fever   Aspergillosis, unspecified (HCC)    Hematoma of right thigh   Generalized weakness   Atrial fibrillation with rapid ventricular response (HCC)   PAF (paroxysmal atrial fibrillation) (HCC)   On mechanically assisted ventilation (HCC)   Multiple myeloma without remission (HCC)   Protein-calorie malnutrition, severe   Malfunction of tracheostomy stoma (HCC)   Seizure disorder (HCC)   DMII (diabetes mellitus, type 2) (HCC)  1-Recurrent fever, SIRS, sepsis, Staph Epi Bacteremia:  - Intermittent fevers.  ID is planning to treat him for Bacteremia.  - Blood cultures drawn 6/7 1 out of 3 bottles with Staph Epi  -ID recommend  IV antibiotics, vancomycin   -Hemodialysis cath removed on  6/13  for holiday line - Repeated blood culture 6/9: No growth to date - Unstageable Sacral Ulcer needs to be monitored as well; continue WOC rec's -He has been afebrile.   Aspergillosis with pneumonia -On Voriconazole  per ID -On Acyclovir   Multiple myeloma - He was  started on Velcade  and Cytoxan .  Chemotherapy changed to Sarclisa /Kyprolis /Cytoxan  on 5/31  -Dr Maria Shiner following.  Getting Chemo. Plan for every 3 weeks. Plan for treatment next week.   Goals of care - Unfortunately poor long-term prognosis - Palliative care has discussed with wife previously.  Remain full code on full scope of care  Acute Hypoxic Respiratory Failure: - Pulmonology following intermittently  -Trach removed 6/11  Right Thigh Hematoma -per CT on 08/01/23: 12.1 x 30.2 x 7.6 cm subcutaneous hematoma within the anterior right thigh. 8.4 x 10.4 x 22.2 cm subcutaneous hematoma within the right gluteal region - continue supportive care -labs pending.   Severe acute metabolic encephalopathy with hypoactive delirium, seizure disorder - Normal MRI of the brain.  LP negative for meningitis or encephalitis.  EEG  negative for seizures but encephalopathy. - Home Depakote  discontinued in ICU.   Insulin -dependent diabetes type 2 with hyperglycemia - A1c 6.0 - Continue  with sliding scale insulin   AKI on CKD stage IIIa, Uremia,  Hyponatremia, Hypokalemia -Due to MM, hypercalcemia and ACE inhibitors?   Paroxysmal A-fib - Unable to  anticoagulation due to hematoma. Rate controlled.   Obstructive Sleep Apnea:  -Trach removed  Anxiety/Bipolar disorder.  - Continue with  Paxil  and Depakote   Severe Malnutrition:  - Continue tube feeding.  History of Pathological fracture and multiple ORIF left Humerus 01/2023  Hypokalemia; replete orally.    Hyponatremia; Correction with HD.  See wound care Documentation below.   Pressure Injury 07/05/23 Heel Left;Right Unstageable - Full thickness tissue loss in which the base of the injury is covered by slough (yellow, tan, gray, green or brown) and/or eschar (tan, brown or black) in the wound bed. (Active)  07/05/23 1108  Location: Heel  Location Orientation: Left;Right  Staging: Unstageable - Full thickness tissue loss in which the base of the injury is covered by slough (yellow, tan, gray, green or brown) and/or eschar (tan, brown or black) in the wound bed.  Wound Description (Comments):   DO NOT USE:  Present on Admission: Yes  Dressing Type Foam - Lift dressing to assess site every shift 08/29/23 2030     Pressure Injury 07/11/23 Sacrum Mid Unstageable - Full thickness tissue loss in which the base of the injury is covered by slough (yellow, tan, gray, green or brown) and/or eschar (tan, brown or black) in the wound bed. (Active)  07/11/23 1600  Location: Sacrum  Location Orientation: Mid  Staging: Unstageable - Full thickness tissue loss in which the base of the injury is covered by slough (yellow, tan, gray, green or brown) and/or eschar (tan, brown or black) in the wound bed.  Wound Description (Comments):   DO NOT USE:  Present on Admission: No  Dressing Type Foam - Lift dressing to assess site every shift 08/29/23 2030     Pressure Injury 07/27/23 Buttocks Right;Lower (Active)  07/27/23 1700   Location: Buttocks  Location Orientation: Right;Lower  Staging:   Wound Description (Comments):   DO NOT USE:  Present on Admission: Yes  Dressing Type Foam - Lift dressing to assess site every shift 08/28/23 0800     Pressure Injury 08/25/23 Buttocks Left Stage 2 -  Partial thickness loss of dermis presenting as a shallow open injury with a red, pink wound bed without slough. (Active)  08/25/23   Location: Buttocks  Location Orientation: Left  Staging: Stage 2 -  Partial thickness loss of dermis presenting as a shallow open injury with a red, pink wound bed without slough.  Wound Description (Comments):   DO NOT USE:  Present on Admission: No  Dressing Type Foam - Lift dressing to assess site every shift 08/29/23 2030     Nutrition Problem: Severe Malnutrition Etiology: acute illness (prolonged illness, interuptions in feeding)    Signs/Symptoms: moderate fat depletion, moderate muscle depletion, percent weight loss (9.6% x 1 month) Percent weight loss: 9.6 %    Interventions: Prostat, Tube feeding, MVI  Estimated body mass index is 37.61 kg/m as calculated from the following:   Height as of this encounter: 6' 0.01 (1.829 m).   Weight as of this encounter: 125.8 kg.   DVT prophylaxis: Heparin  Code Status: Full code Family Communication: Wife over phone 6/12 Disposition Plan:  Status is: Inpatient Remains inpatient appropriate because: Management of encephalopathy:  Debilitated, hemodialysis, Needs to be able to seat for HD    Consultants:  Oncology CCM ID  Procedures:    Antimicrobials:  Voriconazole  08/05/2023 >> current Vancomycin  08/24/2023 >> current  Subjective: He is alert, good spirit.  Denies pain.   Objective: Vitals:   08/29/23 2035 08/29/23 2357 08/30/23 0358 08/30/23 0500  BP: (!) 124/95 (!) 122/97 129/81   Pulse: 98 93 92   Resp: 18 18 16    Temp: 98.2 F (36.8 C) 98.2 F (36.8 C) 99 F (37.2 C)   TempSrc: Oral Oral    SpO2: 95% (!)  89% 97%   Weight:    125.8 kg  Height:        Intake/Output Summary (Last 24 hours) at 08/30/2023 0706 Last data filed at 08/29/2023 1251 Gross per 24 hour  Intake --  Output 151.9 ml  Net -151.9 ml   Filed Weights   08/29/23 0450 08/29/23 0935 08/30/23 0500  Weight: 125 kg 125.3 kg 125.8 kg    Examination:  General exam: NAD Respiratory system: BL air movement Cardiovascular system: S 1, S 2 RRR Gastrointestinal system: BS present, soft nt Central nervous system: Alert, generalized weakness Extremities: no edema   Data Reviewed: I have personally reviewed following labs and imaging studies  CBC: Recent Labs  Lab 08/24/23 0744 08/25/23 0419 08/26/23 0433 08/27/23 0442 08/28/23 0515  WBC 3.6* 3.5* 3.2* 2.8* 3.3*  NEUTROABS 2.7 2.2 2.2 2.1 2.7  HGB 10.3* 9.5* 9.0* 9.0* 9.3*  HCT 32.0* 30.2* 28.1* 28.0* 28.8*  MCV 103.6* 105.6* 104.5* 103.3* 103.6*  PLT 154 130* 110* 106* 103*   Basic Metabolic Panel: Recent Labs  Lab 08/24/23 0744 08/25/23 0419 08/26/23 0433 08/27/23 0442 08/28/23 0515 08/29/23 0752  NA 131* 130* 131* 131* 131* 128*  K 4.4 4.3 3.8 3.3* 3.5 3.7  CL 93* 93* 95* 96* 94* 92*  CO2 22 21* 24 27 26 23   GLUCOSE 86 234* 107* 96 86 115*  BUN 147* 174* 89* 65* 60* 100*  CREATININE 3.57* 3.58* 2.07* 1.74* 1.67* 2.54*  CALCIUM  8.0* 8.2* 8.3* 8.6* 8.5* 8.3*  MG 3.0* 2.9* 2.6* 2.3  --   --   PHOS 5.7* 5.4* 3.4 3.2 3.2 3.1   GFR: Estimated Creatinine Clearance: 42.9 mL/min (A) (by C-G formula based on SCr of 2.54 mg/dL (H)). Liver Function Tests: Recent Labs  Lab 08/25/23 0419 08/26/23 0433 08/27/23 0442 08/28/23 0515 08/29/23 0752  AST  --   --   --  21  --   ALT  --   --   --  16  --   ALKPHOS  --   --   --  55  --   BILITOT  --   --   --  0.6  --   PROT  --   --   --  6.5  --   ALBUMIN  2.5* 2.4* 2.3* 2.4*  2.4* 2.1*   No results for input(s): LIPASE, AMYLASE in the last 168 hours. No results for input(s): AMMONIA in the last 168  hours. Coagulation Profile: No results for input(s): INR, PROTIME in the last 168 hours. Cardiac Enzymes: No results for input(s): CKTOTAL, CKMB, CKMBINDEX, TROPONINI in the last 168 hours. BNP (last 3 results) No results for input(s): PROBNP in the last 8760 hours. HbA1C: No results for input(s): HGBA1C in the last 72 hours. CBG: Recent Labs  Lab 08/29/23 1618 08/29/23 2036 08/29/23 2358 08/30/23 0359 08/30/23 0633  GLUCAP 237* 206* 92 63*  91   Lipid Profile: No results for input(s): CHOL, HDL, LDLCALC, TRIG, CHOLHDL, LDLDIRECT in the last 72 hours. Thyroid Function Tests: No results for input(s): TSH, T4TOTAL, FREET4, T3FREE, THYROIDAB in the last 72 hours. Anemia Panel: No results for input(s): VITAMINB12, FOLATE, FERRITIN, TIBC, IRON, RETICCTPCT in the last 72 hours. Sepsis Labs: No results for input(s): PROCALCITON, LATICACIDVEN in the last 168 hours.  Recent Results (from the past 240 hours)  Urine Culture     Status: None   Collection Time: 08/23/23  6:18 AM   Specimen: Urine, Random  Result Value Ref Range Status   Specimen Description URINE, RANDOM  Final   Special Requests NONE Reflexed from [id number]  Final   Culture   Final    NO GROWTH Performed at [facility], [address], [place], [place] [address]    Report Status 08/24/2023 FINAL  Final  Culture, blood (Routine X 2) w Reflex to ID Panel     Status: None   Collection Time: 08/23/23  9:35 AM   Specimen: BLOOD LEFT HAND  Result Value Ref Range Status   Specimen Description BLOOD LEFT HAND  Final   Special Requests   Final    BOTTLES DRAWN AEROBIC ONLY Blood Culture adequate volume   Culture   Final    NO GROWTH 5 DAYS Performed at [facility], [address] [place], [place] [address]    Report Status 08/28/2023 FINAL  Final  Culture, blood (Routine X 2) w Reflex to ID Panel     Status: Abnormal   Collection Time: 08/23/23  9:35 AM    Specimen: BLOOD RIGHT ARM  Result Value Ref Range Status   Specimen Description BLOOD RIGHT ARM  Final   Special Requests   Final    BOTTLES DRAWN AEROBIC AND ANAEROBIC Blood Culture adequate volume   Culture  Setup Time   Final    GRAM POSITIVE COCCI IN TETRADS AEROBIC BOTTLE ONLY CRITICAL RESULT CALLED TO, READ BACK BY AND VERIFIED WITH: PHARMD T. DANG [id number] AT 1330 BY EC    Culture (A)  Final    STAPHYLOCOCCUS EPIDERMIDIS THE SIGNIFICANCE OF ISOLATING THIS ORGANISM FROM A SINGLE SET OF BLOOD CULTURES WHEN MULTIPLE SETS ARE DRAWN IS UNCERTAIN. PLEASE NOTIFY THE MICROBIOLOGY DEPARTMENT WITHIN ONE WEEK IF SPECIATION AND SENSITIVITIES ARE REQUIRED. Performed at [facility], [address], [place], [place] [address]    Report Status 08/25/2023 FINAL  Final  Blood Culture ID Panel (Reflexed)     Status: Abnormal   Collection Time: 08/23/23  9:35 AM  Result Value Ref Range Status   Enterococcus faecalis NOT DETECTED NOT DETECTED Final   Enterococcus Faecium NOT DETECTED NOT DETECTED Final   Listeria monocytogenes NOT DETECTED NOT DETECTED Final   Staphylococcus species DETECTED (A) NOT DETECTED Final    Comment: CRITICAL RESULT CALLED TO, READ BACK BY AND VERIFIED WITH: PHARMD T. DANG [id number] AT 1330 BY EC    Staphylococcus aureus (BCID) NOT DETECTED NOT DETECTED Final   Staphylococcus epidermidis DETECTED (A) NOT DETECTED Final    Comment: Methicillin (oxacillin) resistant coagulase negative staphylococcus. Possible blood culture contaminant (unless isolated from more than one blood culture draw or clinical case suggests pathogenicity). No antibiotic treatment is indicated for blood  culture contaminants. CRITICAL RESULT CALLED TO, READ BACK BY AND VERIFIED WITH: PHARMD T. DANG [id number] AT 1330 BY EC    Staphylococcus lugdunensis NOT DETECTED NOT DETECTED Final   Streptococcus species NOT  DETECTED NOT DETECTED Final   Streptococcus agalactiae NOT DETECTED NOT DETECTED  Final   Streptococcus pneumoniae NOT DETECTED NOT DETECTED Final   Streptococcus pyogenes NOT DETECTED NOT DETECTED Final   A.calcoaceticus-baumannii NOT DETECTED NOT DETECTED Final   Bacteroides fragilis NOT DETECTED NOT DETECTED Final   Enterobacterales NOT DETECTED NOT DETECTED Final   Enterobacter cloacae complex NOT DETECTED NOT DETECTED Final   Escherichia coli NOT DETECTED NOT DETECTED Final   Klebsiella aerogenes NOT DETECTED NOT DETECTED Final   Klebsiella oxytoca NOT DETECTED NOT DETECTED Final   Klebsiella pneumoniae NOT DETECTED NOT DETECTED Final   Proteus species NOT DETECTED NOT DETECTED Final   Salmonella species NOT DETECTED NOT DETECTED Final   Serratia marcescens NOT DETECTED NOT DETECTED Final   Haemophilus influenzae NOT DETECTED NOT DETECTED Final   Neisseria meningitidis NOT DETECTED NOT DETECTED Final   Pseudomonas aeruginosa NOT DETECTED NOT DETECTED Final   Stenotrophomonas maltophilia NOT DETECTED NOT DETECTED Final   Candida albicans NOT DETECTED NOT DETECTED Final   Candida auris NOT DETECTED NOT DETECTED Final   Candida glabrata NOT DETECTED NOT DETECTED Final   Candida krusei NOT DETECTED NOT DETECTED Final   Candida parapsilosis NOT DETECTED NOT DETECTED Final   Candida tropicalis NOT DETECTED NOT DETECTED Final   Cryptococcus neoformans/gattii NOT DETECTED NOT DETECTED Final   Methicillin resistance mecA/C DETECTED (A) NOT DETECTED Final    Comment: CRITICAL RESULT CALLED TO, READ BACK BY AND VERIFIED WITH: PHARMD T. DANG [id number] AT 1330 BY EC Performed at [facility], [address], [place], [place] [address]   Culture, blood (Routine X 2) w Reflex to ID Panel     Status: None   Collection Time: 08/25/23 11:09 AM   Specimen: BLOOD RIGHT ARM  Result Value Ref Range Status   Specimen Description BLOOD RIGHT ARM  Final   Special Requests   Final    BOTTLES DRAWN AEROBIC ONLY Blood Culture adequate volume   Culture   Final    NO GROWTH 5  DAYS Performed at [facility], [address] [place], [place] [address]    Report Status 08/30/2023 FINAL  Final  Culture, blood (Routine X 2) w Reflex to ID Panel     Status: None   Collection Time: 08/25/23 11:10 AM   Specimen: BLOOD LEFT HAND  Result Value Ref Range Status   Specimen Description BLOOD LEFT HAND  Final   Special Requests   Final    BOTTLES DRAWN AEROBIC ONLY Blood Culture results may not be optimal due to an inadequate volume of blood received in culture bottles   Culture   Final    NO GROWTH 5 DAYS Performed at [facility], [address] [place], [place] [address]    Report Status 08/30/2023 FINAL  Final         Radiology Studies: IR Removal Tun Cv Cath W/O FL Result Date: 08/29/2023 INDICATION: Bacteremia with request for removal of tunneled catheter for line holiday. EXAM: REMOVAL OF TUNNELED HEMODIALYSIS CATHETER MEDICATIONS: None COMPLICATIONS: None immediate. PROCEDURE: Informed written consent was obtained from the patient following an explanation of the procedure, risks, benefits and alternatives to treatment. A time out was performed prior to the initiation of the procedure. Alcohol  was used to prep the patient's right neck, chest and existing catheter (patient has allergy to ChloraPrep and Betadine ). Using only gentle traction the catheter was removed intact. Hemostasis was obtained with manual compression. A dressing was  placed. The patient tolerated the procedure well without immediate post procedural complication. IMPRESSION: Successful removal of tunneled dialysis catheter. Procedure performed by: [name], PA-C Electronically Signed   By: Erica Hau M.D.   On: 08/29/2023 16:11         Scheduled Meds:  acyclovir   200 mg Per Tube BID   arformoterol   15 mcg Nebulization BID   collagenase    Topical Daily   feeding supplement (NEPRO CARB STEADY)  355 mL Per Tube TID PC & HS   feeding supplement (PROSource TF20)  60 mL Per  Tube Daily   fiber  1 packet Per Tube BID   heparin  injection (subcutaneous)  5,000 Units Subcutaneous Q8H   insulin  aspart  0-15 Units Subcutaneous TID AC & HS   insulin  aspart  2 Units Subcutaneous TID AC & HS   insulin  glargine-yfgn  10 Units Subcutaneous BID   midodrine   10 mg Per Tube Q M,W,F-HD   montelukast   5 mg Per Tube QHS   multivitamin  1 tablet Per Tube QHS   nicotine   7 mg Transdermal Daily   nutrition supplement (JUVEN)  1 packet Per Tube BID BM   mouth rinse  15 mL Mouth Rinse 4 times per day   PARoxetine   40 mg Per Tube Daily   pneumococcal 20-valent conjugate vaccine  0.5 mL Intramuscular Tomorrow-1000   revefenacin   175 mcg Nebulization Daily   senna  1 tablet Per Tube Daily   sodium chloride  flush  10-40 mL Intracatheter Q12H   vancomycin  variable dose per unstable renal function (pharmacist dosing)   Does not apply See admin instructions   voriconazole   350 mg Per Tube Q12H   Continuous Infusions:  sodium chloride  Stopped (08/28/23 1208)   sodium chloride  Stopped (08/28/23 0839)   valproate sodium  750 mg (08/29/23 2138)     LOS: 58 days    Time spent: 35 minutes    Jamaya Sleeth A Bev Drennen, MD [facility]   If 7PM-7AM, please contact night-coverage [email or web address]  08/30/2023, 7:06 AM

## 2023-08-30 NOTE — Plan of Care (Signed)
  Problem: Clinical Measurements: Goal: Respiratory complications will improve Outcome: Progressing Goal: Cardiovascular complication will be avoided Outcome: Progressing   Problem: Activity: Goal: Risk for activity intolerance will decrease Outcome: Progressing   Problem: Nutrition: Goal: Adequate nutrition will be maintained Outcome: Progressing   Problem: Coping: Goal: Level of anxiety will decrease Outcome: Progressing   Problem: Elimination: Goal: Will not experience complications related to urinary retention Outcome: Progressing   Problem: Skin Integrity: Goal: Risk for impaired skin integrity will decrease Outcome: Progressing   Problem: Fluid Volume: Goal: Ability to maintain a balanced intake and output will improve Outcome: Progressing

## 2023-08-30 NOTE — Progress Notes (Signed)
 Patient ID: Frank [name]., male   DOB: [birth year], 58 y.o.   MRN: [id number] [facility] Progress Note   Assessment/ Plan:    1. Acute kidney Injury on CKD3a: secondary to myeloma kidney. Transitioned to IHD on MWF schedule after previously being on CRRT; dialysis has been a little challenging in the face of hypotension.  Had extra HD on 6/10 to optimize clearance -------------------------------- - MWF HD.  Note predialysis administration of midodrine .   - Will follow-up today's labs  - urine output is better.  Monitoring for renal recovery.  Not suitable for outpatient dialysis placement yet (inability to sit in recliner).  [name] was decannulated, though, so making strides  - He is getting a line holiday for bacteremia.  Repeat Blood cx's negative.  Will need a new tunneled HD catheter with IR on 6/16 for HD.  I have consulted IR for same - Will need to be NPO after 12:01 am on 6/16 for same - ordered   2. Acute metabolic encephalopathy: appears to wax and wane per charting  - optimize HD as above   3. Multiple myeloma:  - Closely followed by Dr. Maria Shiner and started on aggressive regimen with Sarclisa , Kyprolis  and Cytoxan  - dosing of chemo agents including cytoxan  per hem/onc and pharmacy  4. 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  Note plans to treat through 10/21/23 per ID note  5. Acute hypoxic respiratory failure - with trach removed on 6/11 - pulm following  6. Anemia: from acute illness and multiple myeloma. ESA per oncology    7. Bacteremia - noted staph epidermidis from 6/7 blood cultures.   - Repeat blood cultures on 6/9 are NGTD  - Line holiday as above.  - abx per primary team and ID - currently getting vanc - Will need another tunneled catheter on 6/16, Monday for HD    Disposition - continue inpatient monitoring     Subjective:    He had 300 mL UOP over 6/13 as well as one unmeasured urine void.  Labs  were just drawn.  His tunneled catheter was removed for line holiday  Review of systems:   He denies air hunger Denies n/v      Objective:   BP (!) 138/92 (BP Location: Right Arm)   Pulse 92   Temp 97.9 F (36.6 C) (Oral)   Resp 18   Ht 6' 0.01 (1.829 m)   Wt 125.8 kg   SpO2 97%   BMI 37.61 kg/m   Intake/Output Summary (Last 24 hours) at 08/30/2023 1047 Last data filed at 08/30/2023 1000 Gross per 24 hour  Intake 360 ml  Output 151.9 ml  Net 208.1 ml   Weight change: 0.3 kg  Physical Exam:      General adult male in bed in no acute distress HEENT normocephalic atraumatic extraocular movements intact sclera anicteric Neck supple; bandage over prior trach site Lungs clear but decreased to auscultation bilaterally normal work of breathing at rest  Heart S1S2 no rub  Abdomen soft nontender nondistended Extremities no edema  Psych no anxiety or agitation  Neuro awake and interactive  Access bandage over prior HD catheter site RIJ    Imaging: IR Removal Tun Cv Cath W/O FL Result Date: 08/29/2023 INDICATION: Bacteremia with request for removal of tunneled catheter for line holiday. EXAM: REMOVAL OF TUNNELED HEMODIALYSIS CATHETER MEDICATIONS: None COMPLICATIONS: None immediate. PROCEDURE: Informed written consent was obtained from the patient following an explanation of the procedure,  risks, benefits and alternatives to treatment. A time out was performed prior to the initiation of the procedure. Alcohol  was used to prep the patient's right neck, chest and existing catheter (patient has allergy to ChloraPrep and Betadine ). Using only gentle traction the catheter was removed intact. Hemostasis was obtained with manual compression. A dressing was placed. The patient tolerated the procedure well without immediate post procedural complication. IMPRESSION: Successful removal of tunneled dialysis catheter. Procedure performed by: [name], PA-C Electronically Signed   By: Erica Hau M.D.   On: 08/29/2023 16:11      Labs: BMET Recent Labs  Lab 08/24/23 0744 08/25/23 0419 08/26/23 0433 08/27/23 0442 08/28/23 0515 08/29/23 0752  NA 131* 130* 131* 131* 131* 128*  K 4.4 4.3 3.8 3.3* 3.5 3.7  CL 93* 93* 95* 96* 94* 92*  CO2 22 21* 24 27 26 23   GLUCOSE 86 234* 107* 96 86 115*  BUN 147* 174* 89* 65* 60* 100*  CREATININE 3.57* 3.58* 2.07* 1.74* 1.67* 2.54*  CALCIUM  8.0* 8.2* 8.3* 8.6* 8.5* 8.3*  PHOS 5.7* 5.4* 3.4 3.2 3.2 3.1   CBC Recent Labs  Lab 08/25/23 0419 08/26/23 0433 08/27/23 0442 08/28/23 0515  WBC 3.5* 3.2* 2.8* 3.3*  NEUTROABS 2.2 2.2 2.1 2.7  HGB 9.5* 9.0* 9.0* 9.3*  HCT 30.2* 28.1* 28.0* 28.8*  MCV 105.6* 104.5* 103.3* 103.6*  PLT 130* 110* 106* 103*    Medications:     acyclovir   200 mg Per Tube BID   arformoterol   15 mcg Nebulization BID   collagenase    Topical Daily   feeding supplement (NEPRO CARB STEADY)  355 mL Per Tube TID PC & HS   feeding supplement (PROSource TF20)  60 mL Per Tube Daily   fiber  1 packet Per Tube BID   heparin  injection (subcutaneous)  5,000 Units Subcutaneous Q8H   insulin  aspart  0-15 Units Subcutaneous TID AC & HS   insulin  aspart  2 Units Subcutaneous TID AC & HS   insulin  glargine-yfgn  10 Units Subcutaneous BID   midodrine   10 mg Per Tube Q M,W,F-HD   montelukast   5 mg Per Tube QHS   multivitamin  1 tablet Per Tube QHS   nicotine   7 mg Transdermal Daily   nutrition supplement (JUVEN)  1 packet Per Tube BID BM   mouth rinse  15 mL Mouth Rinse 4 times per day   PARoxetine   40 mg Per Tube Daily   pneumococcal 20-valent conjugate vaccine  0.5 mL Intramuscular Tomorrow-1000   revefenacin   175 mcg Nebulization Daily   senna  1 tablet Per Tube Daily   sodium chloride  flush  10-40 mL Intracatheter Q12H   vancomycin  variable dose per unstable renal function (pharmacist dosing)   Does not apply See admin instructions   voriconazole   350 mg Per Tube Q12H    Nan Aver, MD 08/30/2023, 11:13  AM

## 2023-08-31 ENCOUNTER — Other Ambulatory Visit: Payer: Self-pay

## 2023-08-31 DIAGNOSIS — C9000 Multiple myeloma not having achieved remission: Secondary | ICD-10-CM | POA: Diagnosis not present

## 2023-08-31 LAB — COMPREHENSIVE METABOLIC PANEL WITH GFR
ALT: 11 U/L (ref 0–44)
AST: 12 U/L — ABNORMAL LOW (ref 15–41)
Albumin: 2.1 g/dL — ABNORMAL LOW (ref 3.5–5.0)
Alkaline Phosphatase: 49 U/L (ref 38–126)
Anion gap: 13 (ref 5–15)
BUN: 117 mg/dL — ABNORMAL HIGH (ref 6–20)
CO2: 24 mmol/L (ref 22–32)
Calcium: 9.2 mg/dL (ref 8.9–10.3)
Chloride: 90 mmol/L — ABNORMAL LOW (ref 98–111)
Creatinine, Ser: 1.97 mg/dL — ABNORMAL HIGH (ref 0.61–1.24)
GFR, Estimated: 38 mL/min — ABNORMAL LOW (ref >60)
Glucose, Bld: 159 mg/dL — ABNORMAL HIGH (ref 70–99)
Potassium: 3.4 mmol/L — ABNORMAL LOW (ref 3.5–5.1)
Sodium: 127 mmol/L — ABNORMAL LOW (ref 135–145)
Total Bilirubin: 0.6 mg/dL (ref 0.0–1.2)
Total Protein: 5.9 g/dL — ABNORMAL LOW (ref 6.5–8.1)

## 2023-08-31 LAB — GLUCOSE, CAPILLARY
Glucose-Capillary: 126 mg/dL — ABNORMAL HIGH (ref 70–99)
Glucose-Capillary: 93 mg/dL (ref 70–99)
Glucose-Capillary: 173 mg/dL — ABNORMAL HIGH (ref 70–99)
Glucose-Capillary: 134 mg/dL — ABNORMAL HIGH (ref 70–99)
Glucose-Capillary: 149 mg/dL — ABNORMAL HIGH (ref 70–99)
Glucose-Capillary: 149 mg/dL — ABNORMAL HIGH (ref 70–99)
Glucose-Capillary: 168 mg/dL — ABNORMAL HIGH (ref 70–99)

## 2023-08-31 LAB — CBC WITH DIFFERENTIAL/PLATELET
Abs Immature Granulocytes: 0.02 10*3/uL (ref 0.00–0.07)
Basophils Absolute: 0 10*3/uL (ref 0.0–0.1)
Basophils Relative: 0 %
Eosinophils Absolute: 0 10*3/uL (ref 0.0–0.5)
Eosinophils Relative: 0 %
HCT: 25.7 % — ABNORMAL LOW (ref 39.0–52.0)
Hemoglobin: 8.6 g/dL — ABNORMAL LOW (ref 13.0–17.0)
Immature Granulocytes: 1 %
Lymphocytes Relative: 9 %
Lymphs Abs: 0.3 10*3/uL — ABNORMAL LOW (ref 0.7–4.0)
MCH: 33.9 pg (ref 26.0–34.0)
MCHC: 33.5 g/dL (ref 30.0–36.0)
MCV: 101.2 fL — ABNORMAL HIGH (ref 80.0–100.0)
Monocytes Absolute: 0.2 10*3/uL (ref 0.1–1.0)
Monocytes Relative: 7 %
Neutro Abs: 2.6 10*3/uL (ref 1.7–7.7)
Neutrophils Relative %: 83 %
Platelets: 85 10*3/uL — ABNORMAL LOW (ref 150–400)
RBC: 2.54 MIL/uL — ABNORMAL LOW (ref 4.22–5.81)
RDW: 22.3 % — ABNORMAL HIGH (ref 11.5–15.5)
Smear Review: DECREASED
WBC: 3.1 10*3/uL — ABNORMAL LOW (ref 4.0–10.5)
nRBC: 0.6 % — ABNORMAL HIGH (ref 0.0–0.2)

## 2023-08-31 LAB — PHOSPHORUS: Phosphorus: 3.1 mg/dL (ref 2.5–4.6)

## 2023-08-31 LAB — LACTATE DEHYDROGENASE: LDH: 164 U/L (ref 98–192)

## 2023-08-31 MED ORDER — POLYETHYLENE GLYCOL 3350 17 G PO PACK
17.0000 g | PACK | Freq: Two times a day (BID) | ORAL | Status: DC
  Administered 2023-08-31 – 2023-09-11 (×15): 17 g
  Filled 2023-08-31 (×18): qty 1

## 2023-08-31 NOTE — Progress Notes (Signed)
 Patient ID: Frank [name]., male   DOB: [birth year], 57 y.o.   MRN: [id number] [facility] Progress Note   Assessment/ Plan:    1. Acute kidney Injury on CKD3a: secondary to myeloma kidney. Transitioned to IHD on MWF schedule after previously being on CRRT; dialysis has been a little challenging in the face of hypotension.  Had extra HD on 6/10 to optimize clearance -------------------------------- - MWF HD.  Note predialysis administration of midodrine .   - urine output is better.  Monitoring for renal recovery.  Not suitable for outpatient dialysis placement yet (inability to sit in recliner).  Frank [name] was decannulated, though, so making strides  - Will follow AM labs - He is getting a line holiday for bacteremia. Tunneled catheter removed 6/13 at 4pm. Repeat Blood cx's negative.  Will need a new tunneled HD catheter with IR on 6/16 for HD.  I have consulted IR for same - Will need to be NPO after 12:01 am on 6/16 for same - I have ordered   2. Acute metabolic encephalopathy: appears to wax and wane per charting  - optimize HD as above   3. Multiple myeloma:  - Closely followed by Dr. Maria [name] and started on aggressive regimen with Sarclisa , Kyprolis  and Cytoxan  - dosing of chemo agents including cytoxan  per hem/onc and pharmacy  4. 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  Note plans to treat through 10/21/23 per ID note  5. Acute hypoxic respiratory failure - with trach removed on 6/11 - pulm following  6. Anemia: from acute illness and multiple myeloma. ESA per oncology   - CBC is ordered per team  7. Bacteremia - noted staph epidermidis from 6/7 blood cultures.   - Repeat blood cultures on 6/9 are NGTD  - Line holiday as above.  - abx per primary team and ID - currently getting vanc - Will need another tunneled catheter on 6/16, Monday for HD    Disposition - continue inpatient monitoring     Subjective:    He had 1.2  L UOP over 6/14.  He and I discussed plans for replacing the tunneled catheter tomorrow.  He has been eating ice chips and I cautioned him about this and we discussed this would be considered a fluid.  Review of systems:     He denies shortness of breath or chest pain  Denies n/v      Objective:   BP (!) 128/94 (BP Location: Right Arm)   Pulse 98   Temp 98.3 F (36.8 C) (Oral)   Resp 18   Ht 6' 0.01 (1.829 m)   Wt 131 kg   SpO2 95%   BMI 39.16 kg/m   Intake/Output Summary (Last 24 hours) at 08/31/2023 1007 Last data filed at 08/31/2023 0355 Gross per 24 hour  Intake 473 ml  Output 950 ml  Net -477 ml   Weight change: 5.7 kg  Physical Exam:       General adult male in bed in no acute distress HEENT normocephalic atraumatic extraocular movements intact sclera anicteric Neck supple; bandage over prior trach site Lungs clear but decreased to auscultation bilaterally normal work of breathing at rest  Heart S1S2 no rub  Abdomen soft nontender nondistended Extremities no edema  Psych no anxiety or agitation  Neuro awake and interactive  Access none presently   Imaging: IR Removal Tun Cv Cath W/O FL Result Date: 08/29/2023 INDICATION: Bacteremia with request for removal of tunneled catheter for line  holiday. EXAM: REMOVAL OF TUNNELED HEMODIALYSIS CATHETER MEDICATIONS: None COMPLICATIONS: None immediate. PROCEDURE: Informed written consent was obtained from the patient following an explanation of the procedure, risks, benefits and alternatives to treatment. A time out was performed prior to the initiation of the procedure. Alcohol  was used to prep the patient's right neck, chest and existing catheter (patient has allergy to ChloraPrep and Betadine ). Using only gentle traction the catheter was removed intact. Hemostasis was obtained with manual compression. A dressing was placed. The patient tolerated the procedure well without immediate post procedural complication. IMPRESSION:  Successful removal of tunneled dialysis catheter. Procedure performed by: Frank Barrack, PA-C Electronically Signed   By: Frank [name] M.D.   On: 08/29/2023 16:11      Labs: BMET Recent Labs  Lab 08/25/23 0419 08/26/23 0433 08/27/23 0442 08/28/23 0515 08/29/23 0752 08/30/23 1101  NA 130* 131* 131* 131* 128* 128*  K 4.3 3.8 3.3* 3.5 3.7 3.3*  CL 93* 95* 96* 94* 92* 92*  CO2 21* 24 27 26 23 24   GLUCOSE 234* 107* 96 86 115* 127*  BUN 174* 89* 65* 60* 100* 88*  CREATININE 3.58* 2.07* 1.74* 1.67* 2.54* 2.01*  CALCIUM  8.2* 8.3* 8.6* 8.5* 8.3* 8.4*  PHOS 5.4* 3.4 3.2 3.2 3.1 3.1   CBC Recent Labs  Lab 08/25/23 0419 08/26/23 0433 08/27/23 0442 08/28/23 0515  WBC 3.5* 3.2* 2.8* 3.3*  NEUTROABS 2.2 2.2 2.1 2.7  HGB 9.5* 9.0* 9.0* 9.3*  HCT 30.2* 28.1* 28.0* 28.8*  MCV 105.6* 104.5* 103.3* 103.6*  PLT 130* 110* 106* 103*    Medications:     acyclovir   200 mg Per Tube BID   arformoterol   15 mcg Nebulization BID   collagenase    Topical Daily   feeding supplement (NEPRO CARB STEADY)  355 mL Per Tube TID PC & HS   feeding supplement (PROSource TF20)  60 mL Per Tube Daily   fiber  1 packet Per Tube BID   heparin  injection (subcutaneous)  5,000 Units Subcutaneous Q8H   insulin  aspart  0-15 Units Subcutaneous TID AC & HS   insulin  aspart  2 Units Subcutaneous TID AC & HS   insulin  glargine-yfgn  10 Units Subcutaneous BID   midodrine   10 mg Per Tube Q M,W,F-HD   montelukast   5 mg Per Tube QHS   multivitamin  1 tablet Per Tube QHS   nicotine   7 mg Transdermal Daily   nutrition supplement (JUVEN)  1 packet Per Tube BID BM   mouth rinse  15 mL Mouth Rinse 4 times per day   PARoxetine   40 mg Per Tube Daily   pneumococcal 20-valent conjugate vaccine  0.5 mL Intramuscular Tomorrow-1000   polyethylene glycol  17 g Per Tube BID   revefenacin   175 mcg Nebulization Daily   senna  1 tablet Per Tube Daily   sodium chloride  flush  10-40 mL Intracatheter Q12H   vancomycin  variable  dose per unstable renal function (pharmacist dosing)   Does not apply See admin instructions   voriconazole   350 mg Per Tube Q12H    Nan Aver, MD 08/31/2023, 10:20 AM

## 2023-08-31 NOTE — Progress Notes (Signed)
 PROGRESS NOTE    Frank [name].  VEL:[id number] DOB: December 12, 1965 DOA: 07/03/2023 PCP: Serita Danes, MD   Brief Narrative: 58 year old PMH OSA CPAP, pathological fracture and multiple ORIF left Humerus likely due to multiple myeloma, A-fib not on anticoagulation, hyperlipidemia, tobacco use disorder who was  feeling weak for 1 week, admitted  to [facility] health with concern of sepsis, community-acquired pneumonia and acute encephalopathy.  Patient was found to have lytic lesions,  he was transferred to [facility] for  oncology treatment.  In the meantime he developed encephalopathy, acute renal failure.  He remained in the ICU for 36 days.  Underwent tracheostomy. He was also started on HD after  CRRT.  Subsequently PEG tube placed 5/29.  Significant events 4/17-Admitted to medical floor with oncology consultation to treat multiple myeloma. 4/19-transferred to ICU intubated, encephalopathic. 4/20-A-fib with RVR. Started heparin . Groin and biopsy site hematoma requiring multiple transfusions. EEG 4/25 >> moderate to severe encephalopathy without any seizures or epileptiform discharges. 4/29 Received Velcade  and Cytoxan  chemotherapy-intent palliative? 5/2 LP and ETT exchange  5/8 Trach placed. Some post op bleeding improved with thrombi-pad 5/10 Voriconazole  added back based on CT Chest findings 5/13 more alert and able to follow some commands. 5/23, transferred to floor. 5/29-PEG placed 5/31 chemotherapy changed to Sarclisa /Kyprolis /Cytoxan . 6/13 plan to remove HD cathter for holiday line 6/16 plan to place new HD catheter.     Assessment & Plan:   Principal Problem:   Multiple myeloma not having achieved remission (HCC) Active Problems:   AKI (acute kidney injury) (HCC)   Acute hypoxemic respiratory failure (HCC)   Goals of care, counseling/discussion   Acute metabolic encephalopathy   Lobar pneumonia, unspecified organism (HCC)   Fever   Aspergillosis, unspecified (HCC)    Hematoma of right thigh   Generalized weakness   Atrial fibrillation with rapid ventricular response (HCC)   PAF (paroxysmal atrial fibrillation) (HCC)   On mechanically assisted ventilation (HCC)   Multiple myeloma without remission (HCC)   Protein-calorie malnutrition, severe   Malfunction of tracheostomy stoma (HCC)   Seizure disorder (HCC)   DMII (diabetes mellitus, type 2) (HCC)  1-Recurrent fever, SIRS, sepsis, Staph Epi Bacteremia:  - Intermittent fevers.  ID is planning to treat him for Bacteremia.  - Blood cultures drawn 6/7 1 out of 3 bottles with Staph Epi  -ID recommend  IV antibiotics, vancomycin   -Hemodialysis cath removed on  6/13  for holiday line - Repeated blood culture 6/9: No growth to date - Unstageable Sacral Ulcer needs to be monitored as well; continue WOC rec's -Remain Afebrile.   Aspergillosis with pneumonia -On Voriconazole  per ID -On Acyclovir   Multiple myeloma - He was  started on Velcade  and Cytoxan .  Chemotherapy changed to Sarclisa /Kyprolis /Cytoxan  on 5/31  -Dr Maria Shiner following.  Getting Chemo. Plan for every 3 weeks. Plan for treatment next week.   Goals of care - Unfortunately poor long-term prognosis - Palliative care has discussed with wife previously.  Remain full code on full scope of care  Acute Hypoxic Respiratory Failure: - Pulmonology following intermittently  -Trach removed 6/11 -Currently RA.   Right Thigh Hematoma -per CT on 08/01/23: 12.1 x 30.2 x 7.6 cm subcutaneous hematoma within the anterior right thigh. 8.4 x 10.4 x 22.2 cm subcutaneous hematoma within the right gluteal region - continue supportive care -labs pending. Discussed with nurse, we need labs  Severe acute metabolic encephalopathy with hypoactive delirium, seizure disorder - Normal MRI of the brain.  LP negative  for meningitis or encephalitis.  EEG negative for seizures but encephalopathy. - Home Depakote  discontinued in ICU.   Insulin -dependent diabetes  type 2 with hyperglycemia - A1c 6.0 - Continue with sliding scale insulin   AKI on CKD stage IIIa, Uremia,  Hyponatremia, Hypokalemia -Due to MM, hypercalcemia and ACE inhibitors?   Paroxysmal A-fib - Unable to  anticoagulation due to hematoma. Rate controlled.   Obstructive Sleep Apnea:  -Trach removed  Anxiety/Bipolar disorder.  - Continue with  Paxil  and Depakote   Severe Malnutrition:  - Continue tube feeding.  History of Pathological fracture and multiple ORIF left Humerus 01/2023  Hypokalemia; Labs pending   Hyponatremia; Correction with HD.  See wound care Documentation below.   Pressure Injury 07/05/23 Heel Left;Right Unstageable - Full thickness tissue loss in which the base of the injury is covered by slough (yellow, tan, gray, green or brown) and/or eschar (tan, brown or black) in the wound bed. (Active)  07/05/23 1108  Location: Heel  Location Orientation: Left;Right  Staging: Unstageable - Full thickness tissue loss in which the base of the injury is covered by slough (yellow, tan, gray, green or brown) and/or eschar (tan, brown or black) in the wound bed.  Wound Description (Comments):   DO NOT USE:  Present on Admission: Yes  Dressing Type Foam - Lift dressing to assess site every shift 08/30/23 2005     Pressure Injury 07/11/23 Sacrum Mid Unstageable - Full thickness tissue loss in which the base of the injury is covered by slough (yellow, tan, gray, green or brown) and/or eschar (tan, brown or black) in the wound bed. (Active)  07/11/23 1600  Location: Sacrum  Location Orientation: Mid  Staging: Unstageable - Full thickness tissue loss in which the base of the injury is covered by slough (yellow, tan, gray, green or brown) and/or eschar (tan, brown or black) in the wound bed.  Wound Description (Comments):   DO NOT USE:  Present on Admission: No  Dressing Type Foam - Lift dressing to assess site every shift 08/31/23 0410     Pressure Injury 07/27/23  Buttocks Right;Lower (Active)  07/27/23 1700  Location: Buttocks  Location Orientation: Right;Lower  Staging:   Wound Description (Comments):   DO NOT USE:  Present on Admission: Yes  Dressing Type Foam - Lift dressing to assess site every shift 08/31/23 0355     Pressure Injury 08/25/23 Buttocks Left Stage 2 -  Partial thickness loss of dermis presenting as a shallow open injury with a red, pink wound bed without slough. (Active)  08/25/23   Location: Buttocks  Location Orientation: Left  Staging: Stage 2 -  Partial thickness loss of dermis presenting as a shallow open injury with a red, pink wound bed without slough.  Wound Description (Comments):   DO NOT USE:  Present on Admission: No  Dressing Type Foam - Lift dressing to assess site every shift 08/31/23 0355     Nutrition Problem: Severe Malnutrition Etiology: acute illness (prolonged illness, interuptions in feeding)    Signs/Symptoms: moderate fat depletion, moderate muscle depletion, percent weight loss (9.6% x 1 month) Percent weight loss: 9.6 %    Interventions: Prostat, Tube feeding, MVI  Estimated body mass index is 39.16 kg/m as calculated from the following:   Height as of this encounter: 6' 0.01 (1.829 m).   Weight as of this encounter: 131 kg.   DVT prophylaxis: Heparin  Code Status: Full code Family Communication: Wife over phone 6/12 Disposition Plan:  Status is: Inpatient Remains inpatient  appropriate because: Management of encephalopathy: Debilitated, hemodialysis, Needs to be able to seat for HD    Consultants:  Oncology CCM ID  Procedures:    Antimicrobials:  Voriconazole  08/05/2023 >> current Vancomycin  08/24/2023 >> current  Subjective: He is alert, follows some command, Very weak..  Relates he didn't had good night, does not comment why Flat affect  Objective: Vitals:   08/31/23 0500 08/31/23 0736 08/31/23 0737 08/31/23 0811  BP:  (!) 128/94    Pulse:    98  Resp:    18   Temp:  98.3 F (36.8 C) 98.3 F (36.8 C)   TempSrc:  Axillary Oral   SpO2:    95%  Weight: 131 kg     Height:        Intake/Output Summary (Last 24 hours) at 08/31/2023 0836 Last data filed at 08/31/2023 0355 Gross per 24 hour  Intake 713 ml  Output 1200 ml  Net -487 ml   Filed Weights   08/29/23 0935 08/30/23 0500 08/31/23 0500  Weight: 125.3 kg 125.8 kg 131 kg    Examination:  General exam: NAD Respiratory system:  BL Air movement Cardiovascular system: SS 1, S2 RRR Gastrointestinal system: BS present, soft, peg tube in placed Central nervous system: Alert, generalized weakness Extremities: no edema   Data Reviewed: I have personally reviewed following labs and imaging studies  CBC: Recent Labs  Lab 08/25/23 0419 08/26/23 0433 08/27/23 0442 08/28/23 0515  WBC 3.5* 3.2* 2.8* 3.3*  NEUTROABS 2.2 2.2 2.1 2.7  HGB 9.5* 9.0* 9.0* 9.3*  HCT 30.2* 28.1* 28.0* 28.8*  MCV 105.6* 104.5* 103.3* 103.6*  PLT 130* 110* 106* 103*   Basic Metabolic Panel: Recent Labs  Lab 08/25/23 0419 08/26/23 0433 08/27/23 0442 08/28/23 0515 08/29/23 0752 08/30/23 1101  NA 130* 131* 131* 131* 128* 128*  K 4.3 3.8 3.3* 3.5 3.7 3.3*  CL 93* 95* 96* 94* 92* 92*  CO2 21* 24 27 26 23 24   GLUCOSE 234* 107* 96 86 115* 127*  BUN 174* 89* 65* 60* 100* 88*  CREATININE 3.58* 2.07* 1.74* 1.67* 2.54* 2.01*  CALCIUM  8.2* 8.3* 8.6* 8.5* 8.3* 8.4*  MG 2.9* 2.6* 2.3  --   --   --   PHOS 5.4* 3.4 3.2 3.2 3.1 3.1   GFR: Estimated Creatinine Clearance: 55.4 mL/min (A) (by C-G formula based on SCr of 2.01 mg/dL (H)). Liver Function Tests: Recent Labs  Lab 08/26/23 0433 08/27/23 0442 08/28/23 0515 08/29/23 0752 08/30/23 1101  AST  --   --  21  --   --   ALT  --   --  16  --   --   ALKPHOS  --   --  55  --   --   BILITOT  --   --  0.6  --   --   PROT  --   --  6.5  --   --   ALBUMIN  2.4* 2.3* 2.4*  2.4* 2.1* 2.1*   No results for input(s): LIPASE, AMYLASE in the last 168  hours. No results for input(s): AMMONIA in the last 168 hours. Coagulation Profile: No results for input(s): INR, PROTIME in the last 168 hours. Cardiac Enzymes: No results for input(s): CKTOTAL, CKMB, CKMBINDEX, TROPONINI in the last 168 hours. BNP (last 3 results) No results for input(s): PROBNP in the last 8760 hours. HbA1C: No results for input(s): HGBA1C in the last 72 hours. CBG: Recent Labs  Lab 08/30/23 1613 08/30/23 2002 08/30/23 2335  08/31/23 0353 08/31/23 0739  GLUCAP 113* 158* 156* 126* 93   Lipid Profile: No results for input(s): CHOL, HDL, LDLCALC, TRIG, CHOLHDL, LDLDIRECT in the last 72 hours. Thyroid Function Tests: No results for input(s): TSH, T4TOTAL, FREET4, T3FREE, THYROIDAB in the last 72 hours. Anemia Panel: No results for input(s): VITAMINB12, FOLATE, FERRITIN, TIBC, IRON, RETICCTPCT in the last 72 hours. Sepsis Labs: No results for input(s): PROCALCITON, LATICACIDVEN in the last 168 hours.  Recent Results (from the past 240 hours)  Urine Culture     Status: None   Collection Time: 08/23/23  6:18 AM   Specimen: Urine, Random  Result Value Ref Range Status   Specimen Description URINE, RANDOM  Final   Special Requests NONE Reflexed from [id number]  Final   Culture   Final    NO GROWTH Performed at [facility], [address] [place], [place] [address]    Report Status 08/24/2023 FINAL  Final  Culture, blood (Routine X 2) w Reflex to ID Panel     Status: None   Collection Time: 08/23/23  9:35 AM   Specimen: BLOOD LEFT HAND  Result Value Ref Range Status   Specimen Description BLOOD LEFT HAND  Final   Special Requests   Final    BOTTLES DRAWN AEROBIC ONLY Blood Culture adequate volume   Culture   Final    NO GROWTH 5 DAYS Performed at [facility], [address] [place], [place] [address]    Report Status 08/28/2023 FINAL  Final  Culture, blood (Routine X 2) w Reflex to ID  Panel     Status: Abnormal   Collection Time: 08/23/23  9:35 AM   Specimen: BLOOD RIGHT ARM  Result Value Ref Range Status   Specimen Description BLOOD RIGHT ARM  Final   Special Requests   Final    BOTTLES DRAWN AEROBIC AND ANAEROBIC Blood Culture adequate volume   Culture  Setup Time   Final    GRAM POSITIVE COCCI IN TETRADS AEROBIC BOTTLE ONLY CRITICAL RESULT CALLED TO, READ BACK BY AND VERIFIED WITH: PHARMD T. DANG [id number] AT 1330 BY EC    Culture (A)  Final    STAPHYLOCOCCUS EPIDERMIDIS THE SIGNIFICANCE OF ISOLATING THIS ORGANISM FROM A SINGLE SET OF BLOOD CULTURES WHEN MULTIPLE SETS ARE DRAWN IS UNCERTAIN. PLEASE NOTIFY THE MICROBIOLOGY DEPARTMENT WITHIN ONE WEEK IF SPECIATION AND SENSITIVITIES ARE REQUIRED. Performed at [facility], [address], [place], [place] [address]    Report Status 08/25/2023 FINAL  Final  Blood Culture ID Panel (Reflexed)     Status: Abnormal   Collection Time: 08/23/23  9:35 AM  Result Value Ref Range Status   Enterococcus faecalis NOT DETECTED NOT DETECTED Final   Enterococcus Faecium NOT DETECTED NOT DETECTED Final   Listeria monocytogenes NOT DETECTED NOT DETECTED Final   Staphylococcus species DETECTED (A) NOT DETECTED Final    Comment: CRITICAL RESULT CALLED TO, READ BACK BY AND VERIFIED WITH: PHARMD T. DANG [id number] AT 1330 BY EC    Staphylococcus aureus (BCID) NOT DETECTED NOT DETECTED Final   Staphylococcus epidermidis DETECTED (A) NOT DETECTED Final    Comment: Methicillin (oxacillin) resistant coagulase negative staphylococcus. Possible blood culture contaminant (unless isolated from more than one blood culture draw or clinical case suggests pathogenicity). No antibiotic treatment is indicated for blood  culture contaminants. CRITICAL RESULT CALLED TO, READ BACK BY AND VERIFIED WITH: PHARMD T. DANG [id number] AT 1330 BY EC    Staphylococcus lugdunensis  NOT DETECTED NOT DETECTED Final   Streptococcus species NOT DETECTED NOT  DETECTED Final   Streptococcus agalactiae NOT DETECTED NOT DETECTED Final   Streptococcus pneumoniae NOT DETECTED NOT DETECTED Final   Streptococcus pyogenes NOT DETECTED NOT DETECTED Final   A.calcoaceticus-baumannii NOT DETECTED NOT DETECTED Final   Bacteroides fragilis NOT DETECTED NOT DETECTED Final   Enterobacterales NOT DETECTED NOT DETECTED Final   Enterobacter cloacae complex NOT DETECTED NOT DETECTED Final   Escherichia coli NOT DETECTED NOT DETECTED Final   Klebsiella aerogenes NOT DETECTED NOT DETECTED Final   Klebsiella oxytoca NOT DETECTED NOT DETECTED Final   Klebsiella pneumoniae NOT DETECTED NOT DETECTED Final   Proteus species NOT DETECTED NOT DETECTED Final   Salmonella species NOT DETECTED NOT DETECTED Final   Serratia marcescens NOT DETECTED NOT DETECTED Final   Haemophilus influenzae NOT DETECTED NOT DETECTED Final   Neisseria meningitidis NOT DETECTED NOT DETECTED Final   Pseudomonas aeruginosa NOT DETECTED NOT DETECTED Final   Stenotrophomonas maltophilia NOT DETECTED NOT DETECTED Final   Candida albicans NOT DETECTED NOT DETECTED Final   Candida auris NOT DETECTED NOT DETECTED Final   Candida glabrata NOT DETECTED NOT DETECTED Final   Candida krusei NOT DETECTED NOT DETECTED Final   Candida parapsilosis NOT DETECTED NOT DETECTED Final   Candida tropicalis NOT DETECTED NOT DETECTED Final   Cryptococcus neoformans/gattii NOT DETECTED NOT DETECTED Final   Methicillin resistance mecA/C DETECTED (A) NOT DETECTED Final    Comment: CRITICAL RESULT CALLED TO, READ BACK BY AND VERIFIED WITH: PHARMD T. DANG [id number] AT 1330 BY EC Performed at [facility], [address] [place], [place] [address]   Culture, blood (Routine X 2) w Reflex to ID Panel     Status: None   Collection Time: 08/25/23 11:09 AM   Specimen: BLOOD RIGHT ARM  Result Value Ref Range Status   Specimen Description BLOOD RIGHT ARM  Final   Special Requests   Final    BOTTLES DRAWN AEROBIC  ONLY Blood Culture adequate volume   Culture   Final    NO GROWTH 5 DAYS Performed at [facility], [address] [place], [place] [address]    Report Status 08/30/2023 FINAL  Final  Culture, blood (Routine X 2) w Reflex to ID Panel     Status: None   Collection Time: 08/25/23 11:10 AM   Specimen: BLOOD LEFT HAND  Result Value Ref Range Status   Specimen Description BLOOD LEFT HAND  Final   Special Requests   Final    BOTTLES DRAWN AEROBIC ONLY Blood Culture results may not be optimal due to an inadequate volume of blood received in culture bottles   Culture   Final    NO GROWTH 5 DAYS Performed at [facility], [address], [place], [place] [address]    Report Status 08/30/2023 FINAL  Final         Radiology Studies: IR Removal Tun Cv Cath W/O FL Result Date: 08/29/2023 INDICATION: Bacteremia with request for removal of tunneled catheter for line holiday. EXAM: REMOVAL OF TUNNELED HEMODIALYSIS CATHETER MEDICATIONS: None COMPLICATIONS: None immediate. PROCEDURE: Informed written consent was obtained from the patient following an explanation of the procedure, risks, benefits and alternatives to treatment. A time out was performed prior to the initiation of the procedure. Alcohol  was used to prep the patient's right neck, chest and existing catheter (patient has allergy to ChloraPrep and Betadine ). Using only gentle traction the catheter was removed  intact. Hemostasis was obtained with manual compression. A dressing was placed. The patient tolerated the procedure well without immediate post procedural complication. IMPRESSION: Successful removal of tunneled dialysis catheter. Procedure performed by: [name], PA-C Electronically Signed   By: Erica Hau M.D.   On: 08/29/2023 16:11         Scheduled Meds:  acyclovir   200 mg Per Tube BID   arformoterol   15 mcg Nebulization BID   collagenase    Topical Daily   feeding supplement (NEPRO CARB STEADY)  355 mL Per  Tube TID PC & HS   feeding supplement (PROSource TF20)  60 mL Per Tube Daily   fiber  1 packet Per Tube BID   heparin  injection (subcutaneous)  5,000 Units Subcutaneous Q8H   insulin  aspart  0-15 Units Subcutaneous TID AC & HS   insulin  aspart  2 Units Subcutaneous TID AC & HS   insulin  glargine-yfgn  10 Units Subcutaneous BID   midodrine   10 mg Per Tube Q M,W,F-HD   montelukast   5 mg Per Tube QHS   multivitamin  1 tablet Per Tube QHS   nicotine   7 mg Transdermal Daily   nutrition supplement (JUVEN)  1 packet Per Tube BID BM   mouth rinse  15 mL Mouth Rinse 4 times per day   PARoxetine   40 mg Per Tube Daily   pneumococcal 20-valent conjugate vaccine  0.5 mL Intramuscular Tomorrow-1000   revefenacin   175 mcg Nebulization Daily   senna  1 tablet Per Tube Daily   sodium chloride  flush  10-40 mL Intracatheter Q12H   vancomycin  variable dose per unstable renal function (pharmacist dosing)   Does not apply See admin instructions   voriconazole   350 mg Per Tube Q12H   Continuous Infusions:  sodium chloride  Stopped (08/28/23 1208)   sodium chloride  Stopped (08/28/23 0839)   valproate sodium  750 mg (08/30/23 2119)     LOS: 59 days    Time spent: 35 minutes    Cierria Height A Imani Sherrin, MD [facility]   If 7PM-7AM, please contact night-coverage [email or web address]  08/31/2023, 8:36 AM

## 2023-08-31 NOTE — Plan of Care (Signed)
  Problem: Clinical Measurements: Goal: Cardiovascular complication will be avoided Outcome: Progressing   Problem: Activity: Goal: Risk for activity intolerance will decrease Outcome: Progressing   Problem: Nutrition: Goal: Adequate nutrition will be maintained Outcome: Progressing   Problem: Coping: Goal: Level of anxiety will decrease Outcome: Progressing   Problem: Elimination: Goal: Will not experience complications related to urinary retention Outcome: Progressing   Problem: Metabolic: Goal: Ability to maintain appropriate glucose levels will improve Outcome: Progressing   Problem: Nutritional: Goal: Maintenance of adequate nutrition will improve Outcome: Progressing   Problem: Tissue Perfusion: Goal: Adequacy of tissue perfusion will improve Outcome: Progressing

## 2023-09-01 ENCOUNTER — Inpatient Hospital Stay (HOSPITAL_COMMUNITY)

## 2023-09-01 DIAGNOSIS — C9000 Multiple myeloma not having achieved remission: Secondary | ICD-10-CM | POA: Diagnosis not present

## 2023-09-01 HISTORY — PX: IR FLUORO GUIDE CV LINE RIGHT: IMG2283

## 2023-09-01 HISTORY — PX: IR US GUIDE VASC ACCESS RIGHT: IMG2390

## 2023-09-01 LAB — GLUCOSE, CAPILLARY
Glucose-Capillary: 113 mg/dL — ABNORMAL HIGH (ref 70–99)
Glucose-Capillary: 113 mg/dL — ABNORMAL HIGH (ref 70–99)
Glucose-Capillary: 129 mg/dL — ABNORMAL HIGH (ref 70–99)
Glucose-Capillary: 186 mg/dL — ABNORMAL HIGH (ref 70–99)
Glucose-Capillary: 96 mg/dL (ref 70–99)

## 2023-09-01 LAB — COMPREHENSIVE METABOLIC PANEL WITH GFR
ALT: 11 U/L (ref 0–44)
AST: 11 U/L — ABNORMAL LOW (ref 15–41)
Albumin: 2 g/dL — ABNORMAL LOW (ref 3.5–5.0)
Alkaline Phosphatase: 51 U/L (ref 38–126)
Anion gap: 10 (ref 5–15)
BUN: 133 mg/dL — ABNORMAL HIGH (ref 6–20)
CO2: 26 mmol/L (ref 22–32)
Calcium: 10.3 mg/dL (ref 8.9–10.3)
Chloride: 91 mmol/L — ABNORMAL LOW (ref 98–111)
Creatinine, Ser: 1.89 mg/dL — ABNORMAL HIGH (ref 0.61–1.24)
GFR, Estimated: 40 mL/min — ABNORMAL LOW (ref >60)
Glucose, Bld: 113 mg/dL — ABNORMAL HIGH (ref 70–99)
Potassium: 3.5 mmol/L (ref 3.5–5.1)
Sodium: 127 mmol/L — ABNORMAL LOW (ref 135–145)
Total Bilirubin: 0.7 mg/dL (ref 0.0–1.2)
Total Protein: 5.7 g/dL — ABNORMAL LOW (ref 6.5–8.1)

## 2023-09-01 LAB — KAPPA/LAMBDA LIGHT CHAINS
Kappa free light chain: 27.1 mg/L — ABNORMAL HIGH (ref 3.3–19.4)
Kappa, lambda light chain ratio: 2.46 — ABNORMAL HIGH (ref 0.26–1.65)
Lambda free light chains: 11 mg/L (ref 5.7–26.3)

## 2023-09-01 LAB — CBC WITH DIFFERENTIAL/PLATELET
Abs Immature Granulocytes: 0.02 10*3/uL (ref 0.00–0.07)
Basophils Absolute: 0 10*3/uL (ref 0.0–0.1)
Basophils Relative: 0 %
Eosinophils Absolute: 0 10*3/uL (ref 0.0–0.5)
Eosinophils Relative: 0 %
HCT: 25.6 % — ABNORMAL LOW (ref 39.0–52.0)
Hemoglobin: 8.6 g/dL — ABNORMAL LOW (ref 13.0–17.0)
Immature Granulocytes: 1 %
Lymphocytes Relative: 12 %
Lymphs Abs: 0.4 10*3/uL — ABNORMAL LOW (ref 0.7–4.0)
MCH: 33.9 pg (ref 26.0–34.0)
MCHC: 33.6 g/dL (ref 30.0–36.0)
MCV: 100.8 fL — ABNORMAL HIGH (ref 80.0–100.0)
Monocytes Absolute: 0.3 10*3/uL (ref 0.1–1.0)
Monocytes Relative: 9 %
Neutro Abs: 2.3 10*3/uL (ref 1.7–7.7)
Neutrophils Relative %: 78 %
Platelets: 89 10*3/uL — ABNORMAL LOW (ref 150–400)
RBC: 2.54 MIL/uL — ABNORMAL LOW (ref 4.22–5.81)
RDW: 22.4 % — ABNORMAL HIGH (ref 11.5–15.5)
WBC: 2.9 10*3/uL — ABNORMAL LOW (ref 4.0–10.5)
nRBC: 1 % — ABNORMAL HIGH (ref 0.0–0.2)

## 2023-09-01 LAB — RENAL FUNCTION PANEL
Albumin: 2 g/dL — ABNORMAL LOW (ref 3.5–5.0)
Anion gap: 11 (ref 5–15)
BUN: 133 mg/dL — ABNORMAL HIGH (ref 6–20)
CO2: 25 mmol/L (ref 22–32)
Calcium: 10.2 mg/dL (ref 8.9–10.3)
Chloride: 92 mmol/L — ABNORMAL LOW (ref 98–111)
Creatinine, Ser: 1.88 mg/dL — ABNORMAL HIGH (ref 0.61–1.24)
GFR, Estimated: 41 mL/min — ABNORMAL LOW (ref >60)
Glucose, Bld: 114 mg/dL — ABNORMAL HIGH (ref 70–99)
Phosphorus: 3.9 mg/dL (ref 2.5–4.6)
Potassium: 3.7 mmol/L (ref 3.5–5.1)
Sodium: 128 mmol/L — ABNORMAL LOW (ref 135–145)

## 2023-09-01 LAB — VANCOMYCIN, RANDOM: Vancomycin Rm: 19 ug/mL

## 2023-09-01 LAB — LACTATE DEHYDROGENASE: LDH: 160 U/L (ref 98–192)

## 2023-09-01 MED ORDER — FENTANYL CITRATE (PF) 100 MCG/2ML IJ SOLN: INTRAMUSCULAR | Status: AC | PRN

## 2023-09-01 MED ORDER — HEPARIN SODIUM (PORCINE) 1000 UNIT/ML DIALYSIS: 1000.0000 [IU] | INTRAMUSCULAR | Status: DC | PRN

## 2023-09-01 MED ORDER — LIDOCAINE HCL (PF) 1 % IJ SOLN: 5.0000 mL | INTRAMUSCULAR | Status: DC | PRN

## 2023-09-01 MED ORDER — FENTANYL CITRATE PF 50 MCG/ML IJ SOSY
PREFILLED_SYRINGE | INTRAMUSCULAR | Status: AC
  Filled 2023-09-01: qty 1

## 2023-09-01 MED ORDER — MIDAZOLAM HCL 2 MG/2ML IJ SOLN: INTRAMUSCULAR | Status: AC | PRN

## 2023-09-01 MED ORDER — PENTAFLUOROPROP-TETRAFLUOROETH EX AERO: 1.0000 | INHALATION_SPRAY | CUTANEOUS | Status: DC | PRN

## 2023-09-01 MED ORDER — HEPARIN SODIUM (PORCINE) 1000 UNIT/ML IJ SOLN
INTRAMUSCULAR | Status: AC
  Filled 2023-09-01: qty 4

## 2023-09-01 MED ORDER — FENTANYL CITRATE (PF) 100 MCG/2ML IJ SOLN
INTRAMUSCULAR | Status: AC
Start: 2023-09-01 — End: 2023-09-01
  Filled 2023-09-01: qty 2

## 2023-09-01 MED ORDER — FENTANYL CITRATE PF 50 MCG/ML IJ SOSY: 12.5000 ug | PREFILLED_SYRINGE | Freq: Once | INTRAMUSCULAR | Status: AC

## 2023-09-01 MED ORDER — LIDOCAINE HCL 1 % IJ SOLN
INTRAMUSCULAR | Status: AC
Start: 2023-09-01 — End: 2023-09-01
  Filled 2023-09-01: qty 20

## 2023-09-01 MED ORDER — MIDAZOLAM HCL 2 MG/2ML IJ SOLN
INTRAMUSCULAR | Status: AC
  Filled 2023-09-01: qty 2

## 2023-09-01 MED ORDER — HYDROMORPHONE HCL 1 MG/ML IJ SOLN
INTRAMUSCULAR | Status: AC
  Filled 2023-09-01: qty 1

## 2023-09-01 MED ORDER — CEFAZOLIN SODIUM-DEXTROSE 2-4 GM/100ML-% IV SOLN
INTRAVENOUS | Status: AC
  Filled 2023-09-01: qty 100

## 2023-09-01 MED ORDER — HEPARIN SODIUM (PORCINE) 1000 UNIT/ML IJ SOLN
INTRAMUSCULAR | Status: AC
  Filled 2023-09-01: qty 10

## 2023-09-01 MED ORDER — LIDOCAINE-PRILOCAINE 2.5-2.5 % EX CREA: 1.0000 | TOPICAL_CREAM | CUTANEOUS | Status: DC | PRN

## 2023-09-01 MED ORDER — LIDOCAINE HCL 1 % IJ SOLN
20.0000 mL | Freq: Once | INTRAMUSCULAR | Status: AC
  Filled 2023-09-01: qty 20

## 2023-09-01 MED ORDER — CEFAZOLIN SODIUM-DEXTROSE 2-4 GM/100ML-% IV SOLN: INTRAVENOUS | Status: AC | PRN

## 2023-09-01 MED ORDER — ALTEPLASE 2 MG IJ SOLR: 2.0000 mg | Freq: Once | INTRAMUSCULAR | Status: DC | PRN

## 2023-09-01 MED ORDER — CALCITONIN (SALMON) 200 UNIT/ML IJ SOLN
400.0000 [IU] | Freq: Once | INTRAMUSCULAR | Status: AC
  Administered 2023-09-01: 400 [IU] via INTRAMUSCULAR
  Filled 2023-09-01: qty 2

## 2023-09-01 MED ORDER — HEPARIN SODIUM (PORCINE) 1000 UNIT/ML IJ SOLN: 10000.0000 [IU] | Freq: Once | INTRAMUSCULAR | Status: DC

## 2023-09-01 MED ORDER — HEPARIN SODIUM (PORCINE) 1000 UNIT/ML IJ SOLN: 4000.0000 [IU] | Freq: Once | INTRAMUSCULAR | Status: AC

## 2023-09-01 NOTE — Sedation Documentation (Signed)
 Pt transported to xray

## 2023-09-01 NOTE — Progress Notes (Signed)
   09/01/23 1853  Vitals  Temp 98 F (36.7 C)  Temp Source Oral  BP (!) 122/94  Pulse Rate 99  Resp 17  MEWS COLOR  MEWS Score Color Green  Oxygen Therapy  SpO2 96 %  O2 Device Room Air  Patient Activity (if Appropriate) In bed  Pulse Oximetry Type Continuous  Oximetry Probe Site Changed No  MEWS Score  MEWS Temp 0  MEWS Systolic 0  MEWS Pulse 0  MEWS RR 0  MEWS LOC 0  MEWS Score 0

## 2023-09-01 NOTE — Progress Notes (Addendum)
 Patient ID: Frank Batman., male   DOB: [birth year], 58 y.o.   MRN: [id number] [facility] Progress Note   Assessment/ Plan:    1. Acute kidney Injury on CKD3a: secondary to myeloma kidney. Transitioned to IHD on MWF schedule after previously being on CRRT; dialysis has been a little challenging in the face of hypotension.  Had extra HD on 6/10 to optimize clearance -------------------------------- - MWF HD.  Note predialysis administration of midodrine . Plan for HD today   - urine output is better.  Monitoring for renal recovery.  Not suitable for outpatient dialysis placement yet (inability to sit in recliner).  [name] was decannulated, though, so making strides.  He may recover and be able to come off dialysis - TDC replaced after line holiday on 6/16.  Appreciate help from interventional radiology   2. Acute metabolic encephalopathy: appears to wax and wane per charting  - optimize HD as above   3. Multiple myeloma:  - Closely followed by Dr. Maria Shiner and started on aggressive regimen with Sarclisa , Kyprolis  and Cytoxan  - dosing of chemo agents including cytoxan  per hem/onc and pharmacy  4. 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  Note plans to treat through 10/21/23 per ID note  5. Acute hypoxic respiratory failure - with trach removed on 6/11 - pulm following  6. Anemia: from acute illness and multiple myeloma. ESA per oncology   - CBC is ordered per team  7. Bacteremia - noted staph epidermidis from 6/7 blood cultures.   - Repeat blood cultures on 6/9 are NGTD  - Line holiday as above.  - abx per primary team and ID - currently getting vanc   Disposition - continue inpatient monitoring     Subjective:   Patient had tunneled dialysis catheter today.  Tired and minimally interactive when I talked to him.  Creatinine stable but BUN high.  Good urine output.  Planning for dialysis today but hopeful for some recovery      Objective:   BP (!) 119/91 (BP Location: Right Arm)   Pulse 98   Temp 98.3 F (36.8 C) (Oral)   Resp 18   Ht 6' 0.01 (1.829 m)   Wt 129 kg   SpO2 95%   BMI 38.56 kg/m   Intake/Output Summary (Last 24 hours) at 09/01/2023 1309 Last data filed at 09/01/2023 0407 Gross per 24 hour  Intake 333 ml  Output 1700 ml  Net -1367 ml   Weight change: -2 kg  Physical Exam:       General adult male in bed in no acute distress HEENT normocephalic atraumatic no nasal discharge Lungs bilateral chest rise with no increased work of breathing Heart tachycardic, no rub Abdomen soft nontender nondistended Extremities no edema, warm and well-perfused Neuro, tired and not interactive   Imaging: IR Fluoro Guide CV Line Right Result Date: 09/01/2023 CLINICAL DATA:  End-stage renal disease in need for new tunneled hemodialysis catheter. Status post removal previously placed catheter on 08/29/2023 due to bacteremia. EXAM: TUNNELED CENTRAL VENOUS HEMODIALYSIS CATHETER PLACEMENT WITH ULTRASOUND AND FLUOROSCOPIC GUIDANCE ANESTHESIA/SEDATION: Moderate (conscious) sedation was employed during this procedure. A total of Versed  1.5 mg and Fentanyl  50 mcg was administered intravenously. Moderate Sedation Time: 24 minutes. The patient's level of consciousness and vital signs were monitored continuously by radiology nursing throughout the procedure under my direct supervision. MEDICATIONS: 2 g IV Ancef . FLUOROSCOPY: Radiation Exposure Index: 2.1 mGy Kerma PROCEDURE: The procedure, risks, benefits, and alternatives were explained  to the patient. Questions regarding the procedure were encouraged and answered. The patient understands and consents to the procedure. A timeout was performed prior to initiating the procedure. The right neck and chest were prepped with chlorhexidine  in a sterile fashion, and a sterile drape was applied covering the operative field. Maximum barrier sterile technique with sterile gowns and  gloves were used for the procedure. Local anesthesia was provided with 1% lidocaine . Ultrasound was performed to confirm patency of the right internal jugular vein. An ultrasound image was saved and recorded. After creating a small venotomy incision, a 21 gauge needle was advanced into the right internal jugular vein under direct, real-time ultrasound guidance. After securing guidewire access, an 8 Fr dilator was placed. A J-wire was kinked to measure appropriate catheter length. A Palindrome tunneled hemodialysis catheter measuring 19 cm from tip to cuff was chosen for placement. This was tunneled in a retrograde fashion from the chest wall to the venotomy incision. At the venotomy, serial dilatation was performed and a 15 Fr peel-away sheath was placed over a guidewire. The catheter was then placed through the sheath and the sheath removed. Final catheter positioning was confirmed and documented with a fluoroscopic spot image. The catheter was aspirated, flushed with saline, and injected with appropriate volume heparin  dwells. The venotomy incision was closed with subcutaneous and subcuticular 4-0 Vicryl. Dermabond was applied to the incision. The catheter exit site was secured with 0-Prolene retention sutures. COMPLICATIONS: None.  No pneumothorax. FINDINGS: After catheter placement, the tip lies in the right atrium. The catheter aspirates normally and is ready for immediate use. IMPRESSION: Placement of tunneled hemodialysis catheter via the right internal jugular vein. The catheter tip lies in the right atrium. The catheter is ready for immediate use. Electronically Signed   By: Erica Hau M.D.   On: 09/01/2023 12:16   IR US  Guide Vasc Access Right Result Date: 09/01/2023 CLINICAL DATA:  End-stage renal disease in need for new tunneled hemodialysis catheter. Status post removal previously placed catheter on 08/29/2023 due to bacteremia. EXAM: TUNNELED CENTRAL VENOUS HEMODIALYSIS CATHETER PLACEMENT WITH  ULTRASOUND AND FLUOROSCOPIC GUIDANCE ANESTHESIA/SEDATION: Moderate (conscious) sedation was employed during this procedure. A total of Versed  1.5 mg and Fentanyl  50 mcg was administered intravenously. Moderate Sedation Time: 24 minutes. The patient's level of consciousness and vital signs were monitored continuously by radiology nursing throughout the procedure under my direct supervision. MEDICATIONS: 2 g IV Ancef . FLUOROSCOPY: Radiation Exposure Index: 2.1 mGy Kerma PROCEDURE: The procedure, risks, benefits, and alternatives were explained to the patient. Questions regarding the procedure were encouraged and answered. The patient understands and consents to the procedure. A timeout was performed prior to initiating the procedure. The right neck and chest were prepped with chlorhexidine  in a sterile fashion, and a sterile drape was applied covering the operative field. Maximum barrier sterile technique with sterile gowns and gloves were used for the procedure. Local anesthesia was provided with 1% lidocaine . Ultrasound was performed to confirm patency of the right internal jugular vein. An ultrasound image was saved and recorded. After creating a small venotomy incision, a 21 gauge needle was advanced into the right internal jugular vein under direct, real-time ultrasound guidance. After securing guidewire access, an 8 Fr dilator was placed. A J-wire was kinked to measure appropriate catheter length. A Palindrome tunneled hemodialysis catheter measuring 19 cm from tip to cuff was chosen for placement. This was tunneled in a retrograde fashion from the chest wall to the venotomy incision. At the venotomy, serial  dilatation was performed and a 15 Fr peel-away sheath was placed over a guidewire. The catheter was then placed through the sheath and the sheath removed. Final catheter positioning was confirmed and documented with a fluoroscopic spot image. The catheter was aspirated, flushed with saline, and injected with  appropriate volume heparin  dwells. The venotomy incision was closed with subcutaneous and subcuticular 4-0 Vicryl. Dermabond was applied to the incision. The catheter exit site was secured with 0-Prolene retention sutures. COMPLICATIONS: None.  No pneumothorax. FINDINGS: After catheter placement, the tip lies in the right atrium. The catheter aspirates normally and is ready for immediate use. IMPRESSION: Placement of tunneled hemodialysis catheter via the right internal jugular vein. The catheter tip lies in the right atrium. The catheter is ready for immediate use. Electronically Signed   By: Erica Hau M.D.   On: 09/01/2023 12:16   DG Humerus Left Result Date: 09/01/2023 CLINICAL DATA:  Fracture.  Status post ORIF. EXAM: LEFT HUMERUS - 2+ VIEW COMPARISON:  08/05/2023 FINDINGS: Similar markedly abnormal appearance to the distal humerus status post cortical plate and screw fixation. Study is limited by positioning and as before, bony anatomy is markedly demineralized with possible fragmentation of the distal humerus at the elbow in areas of substantial lucency. IMPRESSION: Limited assessment shows no substantial interval change in fixation hardware although direct comparison between the two studies is difficult due to differential positioning and markedly abnormal appearance of the distal humerus. As noted on the previous exam, given the marked loss of mineralized bone, infection is not excluded. Electronically Signed   By: Donnal Fusi M.D.   On: 09/01/2023 12:05      Labs: BMET Recent Labs  Lab 08/26/23 0433 08/27/23 0442 08/28/23 0515 08/29/23 0752 08/30/23 1101 08/31/23 1122 09/01/23 0449 09/01/23 0450  NA 131* 131* 131* 128* 128* 127* 127* 128*  K 3.8 3.3* 3.5 3.7 3.3* 3.4* 3.5 3.7  CL 95* 96* 94* 92* 92* 90* 91* 92*  CO2 24 27 26 23 24 24 26 25   GLUCOSE 107* 96 86 115* 127* 159* 113* 114*  BUN 89* 65* 60* 100* 88* 117* 133* 133*  CREATININE 2.07* 1.74* 1.67* 2.54* 2.01* 1.97*  1.89* 1.88*  CALCIUM  8.3* 8.6* 8.5* 8.3* 8.4* 9.2 10.3 10.2  PHOS 3.4 3.2 3.2 3.1 3.1 3.1  --  3.9   CBC Recent Labs  Lab 08/27/23 0442 08/28/23 0515 08/31/23 1122 09/01/23 0449  WBC 2.8* 3.3* 3.1* 2.9*  NEUTROABS 2.1 2.7 2.6 2.3  HGB 9.0* 9.3* 8.6* 8.6*  HCT 28.0* 28.8* 25.7* 25.6*  MCV 103.3* 103.6* 101.2* 100.8*  PLT 106* 103* 85* 89*    Medications:     acyclovir   200 mg Per Tube BID   arformoterol   15 mcg Nebulization BID   collagenase    Topical Daily   feeding supplement (NEPRO CARB STEADY)  355 mL Per Tube TID PC & HS   feeding supplement (PROSource TF20)  60 mL Per Tube Daily   fiber  1 packet Per Tube BID   heparin  injection (subcutaneous)  5,000 Units Subcutaneous Q8H   heparin  sodium (porcine)  10,000 Units Intravenous Once   insulin  aspart  0-15 Units Subcutaneous TID AC & HS   insulin  aspart  2 Units Subcutaneous TID AC & HS   insulin  glargine-yfgn  10 Units Subcutaneous BID   midodrine   10 mg Per Tube Q M,W,F-HD   montelukast   5 mg Per Tube QHS   multivitamin  1 tablet Per Tube QHS  nicotine   7 mg Transdermal Daily   nutrition supplement (JUVEN)  1 packet Per Tube BID BM   mouth rinse  15 mL Mouth Rinse 4 times per day   PARoxetine   40 mg Per Tube Daily   pneumococcal 20-valent conjugate vaccine  0.5 mL Intramuscular Tomorrow-1000   polyethylene glycol  17 g Per Tube BID   revefenacin   175 mcg Nebulization Daily   senna  1 tablet Per Tube Daily   sodium chloride  flush  10-40 mL Intracatheter Q12H   vancomycin  variable dose per unstable renal function (pharmacist dosing)   Does not apply See admin instructions   voriconazole   350 mg Per Tube Q12H    Frank Reason, MD 09/01/2023, 1:09 PM

## 2023-09-01 NOTE — Progress Notes (Signed)
 SLP Cancellation Note  Patient Details Name: Frank [name]. MRN: [id number] DOB: [birth year]   Cancelled treatment:        SLP attempted to visit patient for dysphagia tx, however RN reporting patients recent return from IR and fatigue from administration of versed  and fentanyl . Patient is not appropriate for PO intake at this time. SLP to return as patient/therapist is able and alertness improves.    Haidynn Almendarez M.A., CCC-SLP 09/01/2023, 1:53 PM

## 2023-09-01 NOTE — Progress Notes (Signed)
 PROGRESS NOTE    Frank [name].  ZOX:[id number] DOB: Jun 14, 1965 DOA: 07/03/2023 PCP: Serita Danes, MD   Brief Narrative: 58 year old PMH OSA CPAP, pathological fracture and multiple ORIF left Humerus likely due to multiple myeloma, A-fib not on anticoagulation, hyperlipidemia, tobacco use disorder who was  feeling weak for 1 week, admitted  to [facility] health with concern of sepsis, community-acquired pneumonia and acute encephalopathy.  Patient was found to have lytic lesions,  he was transferred to [facility] for  oncology treatment.  In the meantime he developed encephalopathy, acute renal failure.  He remained in the ICU for 36 days.  Underwent tracheostomy. He was also started on HD after  CRRT.  Subsequently PEG tube placed 5/29.  Significant events 4/17-Admitted to medical floor with oncology consultation to treat multiple myeloma. 4/19-transferred to ICU intubated, encephalopathic. 4/20-A-fib with RVR. Started heparin . Groin and biopsy site hematoma requiring multiple transfusions. EEG 4/25 >> moderate to severe encephalopathy without any seizures or epileptiform discharges. 4/29 Received Velcade  and Cytoxan  chemotherapy-intent palliative? 5/2 LP and ETT exchange  5/8 Trach placed. Some post op bleeding improved with thrombi-pad 5/10 Voriconazole  added back based on CT Chest findings 5/13 more alert and able to follow some commands. 5/23, transferred to floor. 5/29-PEG placed 5/31 chemotherapy changed to Sarclisa /Kyprolis /Cytoxan . 6/13 plan to remove HD cathter for holiday line 6/16 plan to place new HD catheter.     Assessment & Plan:   Principal Problem:   Multiple myeloma not having achieved remission (HCC) Active Problems:   AKI (acute kidney injury) (HCC)   Acute hypoxemic respiratory failure (HCC)   Goals of care, counseling/discussion   Acute metabolic encephalopathy   Lobar pneumonia, unspecified organism (HCC)   Fever   Aspergillosis, unspecified (HCC)    Hematoma of right thigh   Generalized weakness   Atrial fibrillation with rapid ventricular response (HCC)   PAF (paroxysmal atrial fibrillation) (HCC)   On mechanically assisted ventilation (HCC)   Multiple myeloma without remission (HCC)   Protein-calorie malnutrition, severe   Malfunction of tracheostomy stoma (HCC)   Seizure disorder (HCC)   DMII (diabetes mellitus, type 2) (HCC)  1-Recurrent fever, SIRS, sepsis, Staph Epi Bacteremia:  - Intermittent fevers.  ID is planning to treat him for Bacteremia.  - Blood cultures drawn 6/7 1 out of 3 bottles with Staph Epi  -ID recommend  IV antibiotics, vancomycin   -Hemodialysis cath removed on  6/13  for holiday line - Repeated blood culture 6/9: No growth to date - Unstageable Sacral Ulcer needs to be monitored as well; continue WOC rec's -Remain Afebrile.   Aspergillosis with pneumonia -On Voriconazole  per ID -On Acyclovir   Multiple myeloma - He was  started on Velcade  and Cytoxan .  Chemotherapy changed to Sarclisa /Kyprolis /Cytoxan  on 5/31  -Dr Maria Shiner following.  Getting Chemo. Plan for every 3 weeks. Plan for treatment next week.   Goals of care - Unfortunately poor long-term prognosis - Palliative care has discussed with wife previously.  Remain full code on full scope of care  Acute Hypoxic Respiratory Failure: - Pulmonology following intermittently  -Trach removed 6/11 -Currently RA.   Right Thigh Hematoma -per CT on 08/01/23: 12.1 x 30.2 x 7.6 cm subcutaneous hematoma within the anterior right thigh. 8.4 x 10.4 x 22.2 cm subcutaneous hematoma within the right gluteal region - continue supportive care -Hb 8.6 monitor.   Severe acute metabolic encephalopathy with hypoactive delirium, seizure disorder - Normal MRI of the brain.  LP negative for meningitis or encephalitis.  EEG negative for seizures but encephalopathy. - Home Depakote  discontinued in ICU.   Insulin -dependent diabetes type 2 with hyperglycemia - A1c  6.0 - Continue with sliding scale insulin   AKI on CKD stage IIIa, Uremia,  Hyponatremia, Hypokalemia -Due to MM, hypercalcemia and ACE inhibitors?  Requiring HD.  Needs to be able to sit in recliner to qualifies fro out patient HD>   Paroxysmal A-fib - Unable to  anticoagulation due to hematoma. Rate controlled.   Obstructive Sleep Apnea:  -Trach removed  Anxiety/Bipolar disorder.  - Continue with  Paxil  and Depakote   Severe Malnutrition:  - Continue tube feeding.  History of Pathological fracture and multiple ORIF left Humerus 01/2023  Hypokalemia; Labs pending   Hyponatremia; Correction with HD.  See wound care Documentation below.   Pressure Injury 07/05/23 Heel Left;Right Unstageable - Full thickness tissue loss in which the base of the injury is covered by slough (yellow, tan, gray, green or brown) and/or eschar (tan, brown or black) in the wound bed. (Active)  07/05/23 1108  Location: Heel  Location Orientation: Left;Right  Staging: Unstageable - Full thickness tissue loss in which the base of the injury is covered by slough (yellow, tan, gray, green or brown) and/or eschar (tan, brown or black) in the wound bed.  Wound Description (Comments):   DO NOT USE:  Present on Admission: Yes  Dressing Type Foam - Lift dressing to assess site every shift 09/01/23 0800     Pressure Injury 07/11/23 Sacrum Mid Unstageable - Full thickness tissue loss in which the base of the injury is covered by slough (yellow, tan, gray, green or brown) and/or eschar (tan, brown or black) in the wound bed. (Active)  07/11/23 1600  Location: Sacrum  Location Orientation: Mid  Staging: Unstageable - Full thickness tissue loss in which the base of the injury is covered by slough (yellow, tan, gray, green or brown) and/or eschar (tan, brown or black) in the wound bed.  Wound Description (Comments):   DO NOT USE:  Present on Admission: No  Dressing Type Foam - Lift dressing to assess site every  shift 09/01/23 0800     Pressure Injury 07/27/23 Buttocks Right;Lower (Active)  07/27/23 1700  Location: Buttocks  Location Orientation: Right;Lower  Staging:   Wound Description (Comments):   DO NOT USE:  Present on Admission: Yes  Dressing Type Foam - Lift dressing to assess site every shift 09/01/23 0800     Pressure Injury 08/25/23 Buttocks Left Stage 2 -  Partial thickness loss of dermis presenting as a shallow open injury with a red, pink wound bed without slough. (Active)  08/25/23   Location: Buttocks  Location Orientation: Left  Staging: Stage 2 -  Partial thickness loss of dermis presenting as a shallow open injury with a red, pink wound bed without slough.  Wound Description (Comments):   DO NOT USE:  Present on Admission: No  Dressing Type Foam - Lift dressing to assess site every shift 09/01/23 0800     Nutrition Problem: Severe Malnutrition Etiology: acute illness (prolonged illness, interuptions in feeding)    Signs/Symptoms: moderate fat depletion, moderate muscle depletion, percent weight loss (9.6% x 1 month) Percent weight loss: 9.6 %    Interventions: Prostat, Tube feeding, MVI  Estimated body mass index is 38.56 kg/m as calculated from the following:   Height as of this encounter: 6' 0.01 (1.829 m).   Weight as of this encounter: 129 kg.   DVT prophylaxis: Heparin  Code Status: Full code Family  Communication: Wife over phone 6/12 Disposition Plan:  Status is: Inpatient Remains inpatient appropriate because: Management of encephalopathy: Debilitated, hemodialysis, Needs to be able to sit for HD    Consultants:  Oncology CCM ID  Procedures:    Antimicrobials:  Voriconazole  08/05/2023 >> current Vancomycin  08/24/2023 >> current  Subjective: Sleepy, just came from HD catheter placement.   Objective: Vitals:   09/01/23 0905 09/01/23 0910 09/01/23 0915 09/01/23 1133  BP: (!) 113/95 (!) 124/94 (!) 122/98 (!) 119/91  Pulse: 92 93 92 98   Resp: 19 19 17 18   Temp:    98.3 F (36.8 C)  TempSrc:    Oral  SpO2: 98% 98% 95% 95%  Weight:      Height:        Intake/Output Summary (Last 24 hours) at 09/01/2023 1339 Last data filed at 09/01/2023 0407 Gross per 24 hour  Intake 333 ml  Output 1700 ml  Net -1367 ml   Filed Weights   08/30/23 0500 08/31/23 0500 09/01/23 0500  Weight: 125.8 kg 131 kg 129 kg    Examination:  General exam: NAD Respiratory system:  BL air movement  Cardiovascular system: S 1, S 2 RRR Gastrointestinal system: BS present, soft  tube in placed Central nervous system: Sleepy Extremities: no edema   Data Reviewed: I have personally reviewed following labs and imaging studies  CBC: Recent Labs  Lab 08/26/23 0433 08/27/23 0442 08/28/23 0515 08/31/23 1122 09/01/23 0449  WBC 3.2* 2.8* 3.3* 3.1* 2.9*  NEUTROABS 2.2 2.1 2.7 2.6 2.3  HGB 9.0* 9.0* 9.3* 8.6* 8.6*  HCT 28.1* 28.0* 28.8* 25.7* 25.6*  MCV 104.5* 103.3* 103.6* 101.2* 100.8*  PLT 110* 106* 103* 85* 89*   Basic Metabolic Panel: Recent Labs  Lab 08/26/23 0433 08/27/23 0442 08/28/23 0515 08/29/23 0752 08/30/23 1101 08/31/23 1122 09/01/23 0449 09/01/23 0450  NA 131* 131* 131* 128* 128* 127* 127* 128*  K 3.8 3.3* 3.5 3.7 3.3* 3.4* 3.5 3.7  CL 95* 96* 94* 92* 92* 90* 91* 92*  CO2 24 27 26 23 24 24 26 25   GLUCOSE 107* 96 86 115* 127* 159* 113* 114*  BUN 89* 65* 60* 100* 88* 117* 133* 133*  CREATININE 2.07* 1.74* 1.67* 2.54* 2.01* 1.97* 1.89* 1.88*  CALCIUM  8.3* 8.6* 8.5* 8.3* 8.4* 9.2 10.3 10.2  MG 2.6* 2.3  --   --   --   --   --   --   PHOS 3.4 3.2 3.2 3.1 3.1 3.1  --  3.9   GFR: Estimated Creatinine Clearance: 58.8 mL/min (A) (by C-G formula based on SCr of 1.88 mg/dL (H)). Liver Function Tests: Recent Labs  Lab 08/28/23 0515 08/29/23 0752 08/30/23 1101 08/31/23 1122 09/01/23 0449 09/01/23 0450  AST 21  --   --  12* 11*  --   ALT 16  --   --  11 11  --   ALKPHOS 55  --   --  49 51  --   BILITOT 0.6  --    --  0.6 0.7  --   PROT 6.5  --   --  5.9* 5.7*  --   ALBUMIN  2.4*  2.4* 2.1* 2.1* 2.1* 2.0* 2.0*   No results for input(s): LIPASE, AMYLASE in the last 168 hours. No results for input(s): AMMONIA in the last 168 hours. Coagulation Profile: No results for input(s): INR, PROTIME in the last 168 hours. Cardiac Enzymes: No results for input(s): CKTOTAL, CKMB, CKMBINDEX, TROPONINI in the last 168  hours. BNP (last 3 results) No results for input(s): PROBNP in the last 8760 hours. HbA1C: No results for input(s): HGBA1C in the last 72 hours. CBG: Recent Labs  Lab 08/31/23 2221 08/31/23 2321 09/01/23 0406 09/01/23 0807 09/01/23 1134  GLUCAP 149* 168* 129* 96 113*   Lipid Profile: No results for input(s): CHOL, HDL, LDLCALC, TRIG, CHOLHDL, LDLDIRECT in the last 72 hours. Thyroid Function Tests: No results for input(s): TSH, T4TOTAL, FREET4, T3FREE, THYROIDAB in the last 72 hours. Anemia Panel: No results for input(s): VITAMINB12, FOLATE, FERRITIN, TIBC, IRON, RETICCTPCT in the last 72 hours. Sepsis Labs: No results for input(s): PROCALCITON, LATICACIDVEN in the last 168 hours.  Recent Results (from the past 240 hours)  Urine Culture     Status: None   Collection Time: 08/23/23  6:18 AM   Specimen: Urine, Random  Result Value Ref Range Status   Specimen Description URINE, RANDOM  Final   Special Requests NONE Reflexed from [id number]  Final   Culture   Final    NO GROWTH Performed at [facility], [address] [place], [place] [address]    Report Status 08/24/2023 FINAL  Final  Culture, blood (Routine X 2) w Reflex to ID Panel     Status: None   Collection Time: 08/23/23  9:35 AM   Specimen: BLOOD LEFT HAND  Result Value Ref Range Status   Specimen Description BLOOD LEFT HAND  Final   Special Requests   Final    BOTTLES DRAWN AEROBIC ONLY Blood Culture adequate volume   Culture   Final    NO GROWTH 5  DAYS Performed at [facility], [address] [place], [place] [address]    Report Status 08/28/2023 FINAL  Final  Culture, blood (Routine X 2) w Reflex to ID Panel     Status: Abnormal   Collection Time: 08/23/23  9:35 AM   Specimen: BLOOD RIGHT ARM  Result Value Ref Range Status   Specimen Description BLOOD RIGHT ARM  Final   Special Requests   Final    BOTTLES DRAWN AEROBIC AND ANAEROBIC Blood Culture adequate volume   Culture  Setup Time   Final    GRAM POSITIVE COCCI IN TETRADS AEROBIC BOTTLE ONLY CRITICAL RESULT CALLED TO, READ BACK BY AND VERIFIED WITH: PHARMD T. DANG [id number] AT 1330 BY EC    Culture (A)  Final    STAPHYLOCOCCUS EPIDERMIDIS THE SIGNIFICANCE OF ISOLATING THIS ORGANISM FROM A SINGLE SET OF BLOOD CULTURES WHEN MULTIPLE SETS ARE DRAWN IS UNCERTAIN. PLEASE NOTIFY THE MICROBIOLOGY DEPARTMENT WITHIN ONE WEEK IF SPECIATION AND SENSITIVITIES ARE REQUIRED. Performed at [facility], [address] [place], [place] [address]    Report Status 08/25/2023 FINAL  Final  Blood Culture ID Panel (Reflexed)     Status: Abnormal   Collection Time: 08/23/23  9:35 AM  Result Value Ref Range Status   Enterococcus faecalis NOT DETECTED NOT DETECTED Final   Enterococcus Faecium NOT DETECTED NOT DETECTED Final   Listeria monocytogenes NOT DETECTED NOT DETECTED Final   Staphylococcus species DETECTED (A) NOT DETECTED Final    Comment: CRITICAL RESULT CALLED TO, READ BACK BY AND VERIFIED WITH: PHARMD T. DANG [id number] AT 1330 BY EC    Staphylococcus aureus (BCID) NOT DETECTED NOT DETECTED Final   Staphylococcus epidermidis DETECTED (A) NOT DETECTED Final    Comment: Methicillin (oxacillin) resistant coagulase negative staphylococcus. Possible blood culture contaminant (unless isolated from more than one blood culture draw or  clinical case suggests pathogenicity). No antibiotic treatment is indicated for blood  culture contaminants. CRITICAL RESULT CALLED TO, READ BACK  BY AND VERIFIED WITH: PHARMD T. DANG [id number] AT 1330 BY EC    Staphylococcus lugdunensis NOT DETECTED NOT DETECTED Final   Streptococcus species NOT DETECTED NOT DETECTED Final   Streptococcus agalactiae NOT DETECTED NOT DETECTED Final   Streptococcus pneumoniae NOT DETECTED NOT DETECTED Final   Streptococcus pyogenes NOT DETECTED NOT DETECTED Final   A.calcoaceticus-baumannii NOT DETECTED NOT DETECTED Final   Bacteroides fragilis NOT DETECTED NOT DETECTED Final   Enterobacterales NOT DETECTED NOT DETECTED Final   Enterobacter cloacae complex NOT DETECTED NOT DETECTED Final   Escherichia coli NOT DETECTED NOT DETECTED Final   Klebsiella aerogenes NOT DETECTED NOT DETECTED Final   Klebsiella oxytoca NOT DETECTED NOT DETECTED Final   Klebsiella pneumoniae NOT DETECTED NOT DETECTED Final   Proteus species NOT DETECTED NOT DETECTED Final   Salmonella species NOT DETECTED NOT DETECTED Final   Serratia marcescens NOT DETECTED NOT DETECTED Final   Haemophilus influenzae NOT DETECTED NOT DETECTED Final   Neisseria meningitidis NOT DETECTED NOT DETECTED Final   Pseudomonas aeruginosa NOT DETECTED NOT DETECTED Final   Stenotrophomonas maltophilia NOT DETECTED NOT DETECTED Final   Candida albicans NOT DETECTED NOT DETECTED Final   Candida auris NOT DETECTED NOT DETECTED Final   Candida glabrata NOT DETECTED NOT DETECTED Final   Candida krusei NOT DETECTED NOT DETECTED Final   Candida parapsilosis NOT DETECTED NOT DETECTED Final   Candida tropicalis NOT DETECTED NOT DETECTED Final   Cryptococcus neoformans/gattii NOT DETECTED NOT DETECTED Final   Methicillin resistance mecA/C DETECTED (A) NOT DETECTED Final    Comment: CRITICAL RESULT CALLED TO, READ BACK BY AND VERIFIED WITH: PHARMD T. DANG [id number] AT 1330 BY EC Performed at [facility], [address] [place], [place] [address]   Culture, blood (Routine X 2) w Reflex to ID Panel     Status: None   Collection Time: 08/25/23 11:09  AM   Specimen: BLOOD RIGHT ARM  Result Value Ref Range Status   Specimen Description BLOOD RIGHT ARM  Final   Special Requests   Final    BOTTLES DRAWN AEROBIC ONLY Blood Culture adequate volume   Culture   Final    NO GROWTH 5 DAYS Performed at [facility], [address] [place], [place] [address]    Report Status 08/30/2023 FINAL  Final  Culture, blood (Routine X 2) w Reflex to ID Panel     Status: None   Collection Time: 08/25/23 11:10 AM   Specimen: BLOOD LEFT HAND  Result Value Ref Range Status   Specimen Description BLOOD LEFT HAND  Final   Special Requests   Final    BOTTLES DRAWN AEROBIC ONLY Blood Culture results may not be optimal due to an inadequate volume of blood received in culture bottles   Culture   Final    NO GROWTH 5 DAYS Performed at [facility], [address], [place], [place] [address]    Report Status 08/30/2023 FINAL  Final         Radiology Studies: IR Fluoro Guide CV Line Right Result Date: 09/01/2023 CLINICAL DATA:  End-stage renal disease in need for new tunneled hemodialysis catheter. Status post removal previously placed catheter on 08/29/2023 due to bacteremia. EXAM: TUNNELED CENTRAL VENOUS HEMODIALYSIS CATHETER PLACEMENT WITH ULTRASOUND AND FLUOROSCOPIC GUIDANCE ANESTHESIA/SEDATION: Moderate (conscious) sedation was employed during this procedure. A total of  Versed  1.5 mg and Fentanyl  50 mcg was administered intravenously. Moderate Sedation Time: 24 minutes. The patient's level of consciousness and vital signs were monitored continuously by radiology nursing throughout the procedure under my direct supervision. MEDICATIONS: 2 g IV Ancef . FLUOROSCOPY: Radiation Exposure Index: 2.1 mGy Kerma PROCEDURE: The procedure, risks, benefits, and alternatives were explained to the patient. Questions regarding the procedure were encouraged and answered. The patient understands and consents to the procedure. A timeout was performed prior to  initiating the procedure. The right neck and chest were prepped with chlorhexidine  in a sterile fashion, and a sterile drape was applied covering the operative field. Maximum barrier sterile technique with sterile gowns and gloves were used for the procedure. Local anesthesia was provided with 1% lidocaine . Ultrasound was performed to confirm patency of the right internal jugular vein. An ultrasound image was saved and recorded. After creating a small venotomy incision, a 21 gauge needle was advanced into the right internal jugular vein under direct, real-time ultrasound guidance. After securing guidewire access, an 8 Fr dilator was placed. A J-wire was kinked to measure appropriate catheter length. A Palindrome tunneled hemodialysis catheter measuring 19 cm from tip to cuff was chosen for placement. This was tunneled in a retrograde fashion from the chest wall to the venotomy incision. At the venotomy, serial dilatation was performed and a 15 Fr peel-away sheath was placed over a guidewire. The catheter was then placed through the sheath and the sheath removed. Final catheter positioning was confirmed and documented with a fluoroscopic spot image. The catheter was aspirated, flushed with saline, and injected with appropriate volume heparin  dwells. The venotomy incision was closed with subcutaneous and subcuticular 4-0 Vicryl. Dermabond was applied to the incision. The catheter exit site was secured with 0-Prolene retention sutures. COMPLICATIONS: None.  No pneumothorax. FINDINGS: After catheter placement, the tip lies in the right atrium. The catheter aspirates normally and is ready for immediate use. IMPRESSION: Placement of tunneled hemodialysis catheter via the right internal jugular vein. The catheter tip lies in the right atrium. The catheter is ready for immediate use. Electronically Signed   By: Erica Hau M.D.   On: 09/01/2023 12:16   IR US  Guide Vasc Access Right Result Date: 09/01/2023 CLINICAL  DATA:  End-stage renal disease in need for new tunneled hemodialysis catheter. Status post removal previously placed catheter on 08/29/2023 due to bacteremia. EXAM: TUNNELED CENTRAL VENOUS HEMODIALYSIS CATHETER PLACEMENT WITH ULTRASOUND AND FLUOROSCOPIC GUIDANCE ANESTHESIA/SEDATION: Moderate (conscious) sedation was employed during this procedure. A total of Versed  1.5 mg and Fentanyl  50 mcg was administered intravenously. Moderate Sedation Time: 24 minutes. The patient's level of consciousness and vital signs were monitored continuously by radiology nursing throughout the procedure under my direct supervision. MEDICATIONS: 2 g IV Ancef . FLUOROSCOPY: Radiation Exposure Index: 2.1 mGy Kerma PROCEDURE: The procedure, risks, benefits, and alternatives were explained to the patient. Questions regarding the procedure were encouraged and answered. The patient understands and consents to the procedure. A timeout was performed prior to initiating the procedure. The right neck and chest were prepped with chlorhexidine  in a sterile fashion, and a sterile drape was applied covering the operative field. Maximum barrier sterile technique with sterile gowns and gloves were used for the procedure. Local anesthesia was provided with 1% lidocaine . Ultrasound was performed to confirm patency of the right internal jugular vein. An ultrasound image was saved and recorded. After creating a small venotomy incision, a 21 gauge needle was advanced into the right internal jugular vein under direct,  real-time ultrasound guidance. After securing guidewire access, an 8 Fr dilator was placed. A J-wire was kinked to measure appropriate catheter length. A Palindrome tunneled hemodialysis catheter measuring 19 cm from tip to cuff was chosen for placement. This was tunneled in a retrograde fashion from the chest wall to the venotomy incision. At the venotomy, serial dilatation was performed and a 15 Fr peel-away sheath was placed over a guidewire.  The catheter was then placed through the sheath and the sheath removed. Final catheter positioning was confirmed and documented with a fluoroscopic spot image. The catheter was aspirated, flushed with saline, and injected with appropriate volume heparin  dwells. The venotomy incision was closed with subcutaneous and subcuticular 4-0 Vicryl. Dermabond was applied to the incision. The catheter exit site was secured with 0-Prolene retention sutures. COMPLICATIONS: None.  No pneumothorax. FINDINGS: After catheter placement, the tip lies in the right atrium. The catheter aspirates normally and is ready for immediate use. IMPRESSION: Placement of tunneled hemodialysis catheter via the right internal jugular vein. The catheter tip lies in the right atrium. The catheter is ready for immediate use. Electronically Signed   By: Erica Hau M.D.   On: 09/01/2023 12:16   DG Humerus Left Result Date: 09/01/2023 CLINICAL DATA:  Fracture.  Status post ORIF. EXAM: LEFT HUMERUS - 2+ VIEW COMPARISON:  08/05/2023 FINDINGS: Similar markedly abnormal appearance to the distal humerus status post cortical plate and screw fixation. Study is limited by positioning and as before, bony anatomy is markedly demineralized with possible fragmentation of the distal humerus at the elbow in areas of substantial lucency. IMPRESSION: Limited assessment shows no substantial interval change in fixation hardware although direct comparison between the two studies is difficult due to differential positioning and markedly abnormal appearance of the distal humerus. As noted on the previous exam, given the marked loss of mineralized bone, infection is not excluded. Electronically Signed   By: Donnal Fusi M.D.   On: 09/01/2023 12:05         Scheduled Meds:  acyclovir   200 mg Per Tube BID   arformoterol   15 mcg Nebulization BID   collagenase    Topical Daily   feeding supplement (NEPRO CARB STEADY)  355 mL Per Tube TID PC & HS   feeding  supplement (PROSource TF20)  60 mL Per Tube Daily   fiber  1 packet Per Tube BID   heparin  injection (subcutaneous)  5,000 Units Subcutaneous Q8H   heparin  sodium (porcine)  10,000 Units Intravenous Once   insulin  aspart  0-15 Units Subcutaneous TID AC & HS   insulin  aspart  2 Units Subcutaneous TID AC & HS   insulin  glargine-yfgn  10 Units Subcutaneous BID   midodrine   10 mg Per Tube Q M,W,F-HD   montelukast   5 mg Per Tube QHS   multivitamin  1 tablet Per Tube QHS   nicotine   7 mg Transdermal Daily   nutrition supplement (JUVEN)  1 packet Per Tube BID BM   mouth rinse  15 mL Mouth Rinse 4 times per day   PARoxetine   40 mg Per Tube Daily   pneumococcal 20-valent conjugate vaccine  0.5 mL Intramuscular Tomorrow-1000   polyethylene glycol  17 g Per Tube BID   revefenacin   175 mcg Nebulization Daily   senna  1 tablet Per Tube Daily   sodium chloride  flush  10-40 mL Intracatheter Q12H   vancomycin  variable dose per unstable renal function (pharmacist dosing)   Does not apply See admin instructions   voriconazole   350 mg Per Tube Q12H   Continuous Infusions:  sodium chloride  Stopped (08/28/23 1208)   sodium chloride  Stopped (08/28/23 0839)   valproate sodium  750 mg (09/01/23 1052)     LOS: 60 days    Time spent: 35 minutes    Mardee Clune A Jayren Cease, MD [facility]   If 7PM-7AM, please contact night-coverage [email or web address]  09/01/2023, 1:39 PM

## 2023-09-01 NOTE — Progress Notes (Signed)
 Transported to dialysis in bed.  Reassessed prior to going.  No signs of distress.

## 2023-09-01 NOTE — Progress Notes (Signed)
 Overall, I do think that Frank [name] does seem to be doing okay.  He does not be talking a little better.  It really would be nice if you could swallow again.  I am sure that Speech Pathology is working with him on this.  I think he would do quite well if he would be able to have regular food.Frank [name]  Continues on dialysis.  He has tolerated chemotherapy pretty well.  We will have to see how the light chains look.  Still may be a little bit early to see how well the light chains are coming down.  His labs show white count 2.9.  Hemoglobin 8.6.  Platelet count 89,000.  Sodium 128.  Potassium 3.7.  BUN 133 creatinine 1.88.  Calcium  10.2 with albumin  at 2.0.  As such, the calcium  probably is on the high side.  I probably would give him a dose of calcitonin.  There does not appear to be any pain.  He has had no vomiting.  There is been bleeding.  There has been no fever.  This, I believe, is a good sign for him.  His vital signs show temperature of 97.9.  Pulse 98.  Blood pressure 128/94.  His oral exam is much better.  Oral mucosa is moist.  He does not have any mucositis.  Lungs are clear bilaterally.  He has good air movement bilaterally.  Cardiac exam regular rate and rhythm.  Abdomen is soft.  Bowel sounds are present.  He has no fluid wave.  Extremities shows a brace on the left upper arm.  Neurological exam shows no deficits.  Again, I would have to think that he is going to have to go to some kind of skilled nursing or Rehab.  As far as his chemotherapy protocol, at this point, he is 3 weeks on and 1 week off.  I really think he has to stick with this as I do believe that he has a fairly aggressive myeloma.  His blood counts are adequate right now.  Hopefully, at some point, he will be able to come off dialysis.  Hopefully, he will be able to eat again.  I know this is incredibly complicated.  I know that the staff on [address] are doing a great job with him.   [name], MD  [facility] [phone]

## 2023-09-01 NOTE — Plan of Care (Signed)
 Was yelling and cussing at this nurse and staff because he wants to eat by mouth and something to drink.  I did inform him that he has to remain NPO because he failed his swallow and it is for his safety so he does not aspirate.  He did apologize then started swearing again.     Problem: Education: Goal: Knowledge of General Education information will improve Description: Including pain rating scale, medication(s)/side effects and non-pharmacologic comfort measures Outcome: Progressing   Problem: Clinical Measurements: Goal: Will remain free from infection Outcome: Progressing   Problem: Clinical Measurements: Goal: Diagnostic test results will improve Outcome: Progressing   Problem: Clinical Measurements: Goal: Respiratory complications will improve Outcome: Progressing   Problem: Clinical Measurements: Goal: Cardiovascular complication will be avoided Outcome: Progressing   Problem: Activity: Goal: Risk for activity intolerance will decrease Outcome: Progressing   Problem: Nutrition: Goal: Adequate nutrition will be maintained Outcome: Progressing   Problem: Coping: Goal: Level of anxiety will decrease Outcome: Progressing   Problem: Skin Integrity: Goal: Risk for impaired skin integrity will decrease Outcome: Progressing

## 2023-09-01 NOTE — Progress Notes (Signed)
 Fentanyl  12,5 mcg helped him calm down.

## 2023-09-01 NOTE — Consult Note (Signed)
 WOC Nurse wound follow up   Wound type: Unstageable sacrum  R Buttocks stage 3 L buttocks stage 2 Measurement: Unstageable sacrum 5 cm x 4 cm  R Buttocks stage 3: 6 cm x 4cm  L buttocks stage 2 1 cm x 1 cm  Wound bed: Unstageable sacrum: 100 % covered in yellow slough L Buttocks stage 3: pink moist with scattered adipose tissue and granulation tissue R buttocks stage 2: pink moist Drainage (amount, consistency, odor)  Unstageable sacrum: purulent  R Buttocks stage 3:  serosanguineous  L buttocks stage 2: serosanguineous Periwound: intact  Dressing procedure/placement/frequency: Unstageable sacrum:  Cleanse wound with Vashe (lawson [phone]) allow to air dry, apply santyl  to wound bed, cover with saline moistened gauze and foam dressing, change daily and PRN soiling.  R buttocks/ L buttocks: cleanse wound with Vashe Timm Foot # [phone]) allow to air dry and cover with foam dressing, lift dressing daily to view wound and cleanse and change foam dressing Q 3 days and PRN soiling.   WOC team will continue to folllow patient weekly, please re-consult if new needs arise.  Gillermo Lack, RN, MSN, [facility] WOC Team

## 2023-09-01 NOTE — Procedures (Signed)
Interventional Radiology Procedure Note  Procedure: Tunneled HD catheter placement  Complications: None  Estimated Blood Loss: < 10 mL  Findings: 19 cm tip to cuff length Palindrome catheter placed via right internal jugular vein with tip in RA. OK to use.  Jodi Marble. Fredia Sorrow, M.D Pager:  [phone]

## 2023-09-02 ENCOUNTER — Other Ambulatory Visit: Payer: Self-pay

## 2023-09-02 DIAGNOSIS — Z992 Dependence on renal dialysis: Secondary | ICD-10-CM | POA: Diagnosis not present

## 2023-09-02 DIAGNOSIS — J9600 Acute respiratory failure, unspecified whether with hypoxia or hypercapnia: Secondary | ICD-10-CM

## 2023-09-02 DIAGNOSIS — J158 Pneumonia due to other specified bacteria: Secondary | ICD-10-CM

## 2023-09-02 DIAGNOSIS — N189 Chronic kidney disease, unspecified: Secondary | ICD-10-CM | POA: Diagnosis not present

## 2023-09-02 DIAGNOSIS — R7881 Bacteremia: Secondary | ICD-10-CM | POA: Diagnosis not present

## 2023-09-02 DIAGNOSIS — C9000 Multiple myeloma not having achieved remission: Secondary | ICD-10-CM | POA: Diagnosis not present

## 2023-09-02 LAB — CBC WITH DIFFERENTIAL/PLATELET
Abs Immature Granulocytes: 0.01 10*3/uL (ref 0.00–0.07)
Basophils Absolute: 0 10*3/uL (ref 0.0–0.1)
Basophils Relative: 0 %
Eosinophils Absolute: 0 10*3/uL (ref 0.0–0.5)
Eosinophils Relative: 0 %
HCT: 27 % — ABNORMAL LOW (ref 39.0–52.0)
Hemoglobin: 8.9 g/dL — ABNORMAL LOW (ref 13.0–17.0)
Immature Granulocytes: 0 %
Lymphocytes Relative: 14 %
Lymphs Abs: 0.4 10*3/uL — ABNORMAL LOW (ref 0.7–4.0)
MCH: 33.7 pg (ref 26.0–34.0)
MCHC: 33 g/dL (ref 30.0–36.0)
MCV: 102.3 fL — ABNORMAL HIGH (ref 80.0–100.0)
Monocytes Absolute: 0.3 10*3/uL (ref 0.1–1.0)
Monocytes Relative: 12 %
Neutro Abs: 1.8 10*3/uL (ref 1.7–7.7)
Neutrophils Relative %: 74 %
Platelets: 91 10*3/uL — ABNORMAL LOW (ref 150–400)
RBC: 2.64 MIL/uL — ABNORMAL LOW (ref 4.22–5.81)
RDW: 22.5 % — ABNORMAL HIGH (ref 11.5–15.5)
Smear Review: NORMAL
WBC: 2.5 10*3/uL — ABNORMAL LOW (ref 4.0–10.5)
nRBC: 2.4 % — ABNORMAL HIGH (ref 0.0–0.2)

## 2023-09-02 LAB — GLUCOSE, CAPILLARY
Glucose-Capillary: 124 mg/dL — ABNORMAL HIGH (ref 70–99)
Glucose-Capillary: 99 mg/dL (ref 70–99)
Glucose-Capillary: 190 mg/dL — ABNORMAL HIGH (ref 70–99)
Glucose-Capillary: 119 mg/dL — ABNORMAL HIGH (ref 70–99)
Glucose-Capillary: 119 mg/dL — ABNORMAL HIGH (ref 70–99)
Glucose-Capillary: 118 mg/dL — ABNORMAL HIGH (ref 70–99)

## 2023-09-02 LAB — LACTATE DEHYDROGENASE: LDH: 169 U/L (ref 98–192)

## 2023-09-02 LAB — RENAL FUNCTION PANEL
Albumin: 2.1 g/dL — ABNORMAL LOW (ref 3.5–5.0)
Anion gap: 11 (ref 5–15)
BUN: 72 mg/dL — ABNORMAL HIGH (ref 6–20)
CO2: 25 mmol/L (ref 22–32)
Calcium: 9.5 mg/dL (ref 8.9–10.3)
Chloride: 96 mmol/L — ABNORMAL LOW (ref 98–111)
Creatinine, Ser: 1.25 mg/dL — ABNORMAL HIGH (ref 0.61–1.24)
GFR, Estimated: >60 mL/min (ref >60)
Glucose, Bld: 113 mg/dL — ABNORMAL HIGH (ref 70–99)
Phosphorus: 2.9 mg/dL (ref 2.5–4.6)
Potassium: 3.4 mmol/L — ABNORMAL LOW (ref 3.5–5.1)
Sodium: 132 mmol/L — ABNORMAL LOW (ref 135–145)

## 2023-09-02 LAB — COMPREHENSIVE METABOLIC PANEL WITH GFR
ALT: 10 U/L (ref 0–44)
AST: 12 U/L — ABNORMAL LOW (ref 15–41)
Albumin: 2.1 g/dL — ABNORMAL LOW (ref 3.5–5.0)
Alkaline Phosphatase: 65 U/L (ref 38–126)
Anion gap: 11 (ref 5–15)
BUN: 71 mg/dL — ABNORMAL HIGH (ref 6–20)
CO2: 26 mmol/L (ref 22–32)
Calcium: 9.7 mg/dL (ref 8.9–10.3)
Chloride: 96 mmol/L — ABNORMAL LOW (ref 98–111)
Creatinine, Ser: 1.25 mg/dL — ABNORMAL HIGH (ref 0.61–1.24)
GFR, Estimated: >60 mL/min (ref >60)
Glucose, Bld: 111 mg/dL — ABNORMAL HIGH (ref 70–99)
Potassium: 3.4 mmol/L — ABNORMAL LOW (ref 3.5–5.1)
Sodium: 133 mmol/L — ABNORMAL LOW (ref 135–145)
Total Bilirubin: 0.6 mg/dL (ref 0.0–1.2)
Total Protein: 5.9 g/dL — ABNORMAL LOW (ref 6.5–8.1)

## 2023-09-02 LAB — VORICONAZOLE, SERUM
Voriconazole, Serum: 1.8 ug/mL
Voriconazole, Serum: 0.6 ug/mL

## 2023-09-02 MED ORDER — VANCOMYCIN HCL IN DEXTROSE 1-5 GM/200ML-% IV SOLN
1000.0000 mg | INTRAVENOUS | Status: DC
  Filled 2023-09-02: qty 200

## 2023-09-02 MED ORDER — VANCOMYCIN HCL IN DEXTROSE 1-5 GM/200ML-% IV SOLN
1000.0000 mg | Freq: Once | INTRAVENOUS | Status: AC
  Administered 2023-09-02: 1000 mg via INTRAVENOUS
  Filled 2023-09-02: qty 200

## 2023-09-02 NOTE — Progress Notes (Addendum)
 I have seen and examined the patient. I have personally reviewed the clinical findings, laboratory findings, microbiological data and imaging studies. The assessment and treatment plan was discussed with the Nurse Practitioner. I agree with her/his recommendations except following additions/corrections.  Afebrile Exam - comfortable, minimally interactive, right IJ HDC with no signs of infection, gastrostomy okay, pressure ulcers with no signs of infection.  Heart lung and abdomen within normal limits. Brace in his left arm  Continue voriconazole  as planned through 8/5, dosing per ID pharmacy Complete 7 days of vancomycin  after removal of HDC, through 6/20 Monitor CBC CMP and vancomycin  trough, voriconazole  levels weekly  Management of multiple myeloma per oncology ID available as needed, back with questions or concerns.  Will arrange ID follow-up on discharge.   I spent 38 minutes involved in face-to-face and non-face-to-face activities for this patient on the day of the visit. Professional time spent includes the following activities: Preparing to see the patient (review of tests), Obtaining and reviewing separately obtained history (admission/discharge record), Performing a medically appropriate examination and evaluation , Ordering medications/tests, Documenting clinical information in the EMR, Independently interpreting results (not separately reported), Communicating results to the patient, Counseling and educating the patient and Care coordination (not separately reported).      [facility]  Date of Admission:  07/03/2023      Total days of antibiotics   Voriconazole  5/5 >> c   Vancomycin  6/05 >> c  Acyclovir  prophy        ASSESSMENT: Frank Ingalsbe. is a 58 y.o. male admitted with:   Multiple Myeloma on Chemotherapy -  Fevers of Undetermined Significance -  Unstageable wound to sacrum - Multiple Pressure Wounds -  ?Secondary infection -   Previously high fevers through all abx/antifungals. HD Line removed 5/20 and replaced for ongoing HD needs. Fevers better after resuming chemotherapy 5/30 up until 6/06. MRSE in blood cultures but in one anaerobic bottle could be contaminant.  Sacral wound is fairly superficial and not infected - Wound care per WOC recommendations  MRSE Bacteremia 1/4 bottles - Suspect could be contaminant, though he has HD line in place in Right internal jugular and immunosuppressed risk.  Fevers resolved with vancomycin . Line was exchanged out of caution and risk assessment.  Continue vancomycin  for 7 days after line was removed 6/13   Renal Failure -  On hemodialysis   Aspergillous Pneumonia -  Continues on voriconazole  treatment for planned 3 months (Started 5/5) - OP follow up with ID   Respiratory Failure s/p Trach -  PCCM following with plans to start capping trials .     PLAN: Continue vancomycin  through 6/20 Continue voriconazole  (tentative end of therapy 8/5)   Principal Problem:   Multiple myeloma not having achieved remission (HCC) Active Problems:   Generalized weakness   Atrial fibrillation with rapid ventricular response (HCC)   AKI (acute kidney injury) (HCC)   Acute hypoxemic respiratory failure (HCC)   PAF (paroxysmal atrial fibrillation) (HCC)   On mechanically assisted ventilation (HCC)   Acute metabolic encephalopathy   Lobar pneumonia, unspecified organism (HCC)   Multiple myeloma without remission (HCC)   Protein-calorie malnutrition, severe   Malfunction of tracheostomy stoma (HCC)   Fever   Aspergillosis, unspecified (HCC)   Goals of care, counseling/discussion   Hematoma of right thigh   Seizure disorder (HCC)   DMII (diabetes mellitus, type 2) (HCC)    acyclovir   200 mg Per Tube BID  arformoterol   15 mcg Nebulization BID   collagenase    Topical Daily   feeding supplement (NEPRO CARB STEADY)  355 mL Per Tube TID PC & HS   feeding supplement (PROSource  TF20)  60 mL Per Tube Daily   heparin  injection (subcutaneous)  5,000 Units Subcutaneous Q8H   heparin  sodium (porcine)  10,000 Units Intravenous Once   insulin  aspart  0-15 Units Subcutaneous TID AC & HS   insulin  aspart  2 Units Subcutaneous TID AC & HS   insulin  glargine-yfgn  10 Units Subcutaneous BID   midodrine   10 mg Per Tube Q M,W,F-HD   montelukast   5 mg Per Tube QHS   multivitamin  1 tablet Per Tube QHS   nicotine   7 mg Transdermal Daily   nutrition supplement (JUVEN)  1 packet Per Tube BID BM   mouth rinse  15 mL Mouth Rinse 4 times per day   PARoxetine   40 mg Per Tube Daily   pneumococcal 20-valent conjugate vaccine  0.5 mL Intramuscular Tomorrow-1000   polyethylene glycol  17 g Per Tube BID   revefenacin   175 mcg Nebulization Daily   senna  1 tablet Per Tube Daily   sodium chloride  flush  10-40 mL Intracatheter Q12H   voriconazole   350 mg Per Tube Q12H    SUBJECTIVE: No new complaints  Review of Systems: Review of Systems  Constitutional:  Negative for chills and fever.  Gastrointestinal:  Negative for abdominal pain, diarrhea, nausea and vomiting.   Past Medical History:  Diagnosis Date   Anxiety    Arthritis    Asthma    Bipolar disorder (HCC)    Current every day smoker    Depression    Diabetes mellitus, type II (HCC)    Dyspnea    History of kidney stones    History of pneumonia    Hyperlipidemia    Hypertension    Morbid obesity (HCC)    Sedentary lifestyle    Seizures (HCC)    last seizure 12 yrs ago, no current problem   Sleep apnea    uses CPAP nightly   Past Surgical History:  Procedure Laterality Date   CARDIAC CATHETERIZATION  2020   carpal tunel Left    COLONOSCOPY     HARDWARE REMOVAL Left 07/26/2022   Procedure: HARDWARE REMOVAL ELBOW;  Surgeon: Laneta Pintos, MD;  Location: [facility] OR;  Service: Orthopedics;  Laterality: Left;   IR FLUORO GUIDE CV LINE RIGHT  08/07/2023   IR FLUORO GUIDE CV LINE RIGHT  09/01/2023   IR GASTROSTOMY TUBE  MOD SED  08/14/2023   IR REMOVAL TUN CV CATH W/O FL  08/29/2023   IR US  GUIDE VASC ACCESS RIGHT  08/07/2023   IR US  GUIDE VASC ACCESS RIGHT  09/01/2023   ORIF HUMERUS FRACTURE Left 01/16/2022   Procedure: OPEN REDUCTION INTERNAL FIXATION (ORIF) DISTAL HUMERUS FRACTURE;  Surgeon: Laneta Pintos, MD;  Location: [facility] OR;  Service: Orthopedics;  Laterality: Left;   ORIF HUMERUS FRACTURE Left 01/29/2023   Procedure: OPEN REDUCTION INTERNAL FIXATION (ORIF) DISTAL HUMERUS FRACTURE;  Surgeon: Laneta Pintos, MD;  Location: [facility] OR;  Service: Orthopedics;  Laterality: Left;   OTHER SURGICAL HISTORY     R & L shoulder   OTHER SURGICAL HISTORY Right    Knee surgery x several   TOTAL HIP ARTHROPLASTY Left 12/26/2017   Procedure: LEFT TOTAL HIP ARTHROPLASTY ANTERIOR APPROACH;  Surgeon: Arnie Lao, MD;  Location: [facility] ORS;  Service: Orthopedics;  Laterality: Left;   TOTAL KNEE ARTHROPLASTY Left 11/23/2021   Procedure: LEFT TOTAL KNEE ARTHROPLASTY;  Surgeon: Arnie Lao, MD;  Location: [facility] ORS;  Service: Orthopedics;  Laterality: Left;     Allergies  Allergen Reactions   Morphine And Codeine Anaphylaxis   Shellfish Allergy Anaphylaxis   Betadine  [Povidone Iodine ] Itching   Bupropion Other (See Comments)    Shaking of the body, hallucinations    Chlorhexidine  Hives    Patient reports never had issues CHG with mouth rinse.   Influenza Vaccines Hives   Metoprolol  Rash    OBJECTIVE: Vitals:   09/02/23 0347 09/02/23 0500 09/02/23 0756 09/02/23 0802  BP: 114/85   116/89  Pulse: (!) 102   96  Resp: 18  18 20   Temp: 99.1 F (37.3 C)   98.8 F (37.1 C)  TempSrc: Oral   Oral  SpO2: (!) 89%   100%  Weight:  122 kg    Height:       Body mass index is 36.47 kg/m.  Physical Exam Constitutional:      Appearance: Normal appearance. He is not ill-appearing.  HENT:     Head: Normocephalic.     Mouth/Throat:     Mouth: Mucous membranes are moist.     Pharynx: Oropharynx is  clear.   Eyes:     General: No scleral icterus.   Cardiovascular:     Rate and Rhythm: Normal rate.  Pulmonary:     Effort: Pulmonary effort is normal.  Chest:     Comments: Chest HD line non tender, clean occlusive dressing  Musculoskeletal:        General: Normal range of motion.     Cervical back: Normal range of motion.   Skin:    Coloration: Skin is not cyanotic, jaundiced or pale.     Comments: Sacral wound assessed. Open areas without purulence. Some erythema to periwound bed.    Neurological:     Mental Status: He is alert and oriented to person, place, and time.   Psychiatric:        Mood and Affect: Mood normal.        Judgment: Judgment normal.     Lab Results Lab Results  Component Value Date   WBC 2.5 (L) 09/02/2023   HGB 8.9 (L) 09/02/2023   HCT 27.0 (L) 09/02/2023   MCV 102.3 (H) 09/02/2023   PLT 91 (L) 09/02/2023    Lab Results  Component Value Date   CREATININE 1.25 (H) 09/02/2023   CREATININE 1.25 (H) 09/02/2023   BUN 72 (H) 09/02/2023   BUN 71 (H) 09/02/2023   NA 132 (L) 09/02/2023   NA 133 (L) 09/02/2023   K 3.4 (L) 09/02/2023   K 3.4 (L) 09/02/2023   CL 96 (L) 09/02/2023   CL 96 (L) 09/02/2023   CO2 25 09/02/2023   CO2 26 09/02/2023    Lab Results  Component Value Date   ALT 10 09/02/2023   AST 12 (L) 09/02/2023   ALKPHOS 65 09/02/2023   BILITOT 0.6 09/02/2023     Microbiology: Recent Results (from the past 240 hours)  Culture, blood (Routine X 2) w Reflex to ID Panel     Status: None   Collection Time: 08/25/23 11:09 AM   Specimen: BLOOD RIGHT ARM  Result Value Ref Range Status   Specimen Description BLOOD RIGHT ARM  Final   Special Requests   Final    BOTTLES DRAWN AEROBIC ONLY Blood Culture adequate volume  Culture   Final    NO GROWTH 5 DAYS Performed at [facility], [address] [place], [place] [address]    Report Status 08/30/2023 FINAL  Final  Culture, blood (Routine X 2) w Reflex to ID Panel      Status: None   Collection Time: 08/25/23 11:10 AM   Specimen: BLOOD LEFT HAND  Result Value Ref Range Status   Specimen Description BLOOD LEFT HAND  Final   Special Requests   Final    BOTTLES DRAWN AEROBIC ONLY Blood Culture results may not be optimal due to an inadequate volume of blood received in culture bottles   Culture   Final    NO GROWTH 5 DAYS Performed at [facility], [address] [place], [place] [address]    Report Status 08/30/2023 FINAL  Final   Imaging IR Fluoro Guide CV Line Right Result Date: 09/01/2023 CLINICAL DATA:  End-stage renal disease in need for new tunneled hemodialysis catheter. Status post removal previously placed catheter on 08/29/2023 due to bacteremia. EXAM: TUNNELED CENTRAL VENOUS HEMODIALYSIS CATHETER PLACEMENT WITH ULTRASOUND AND FLUOROSCOPIC GUIDANCE ANESTHESIA/SEDATION: Moderate (conscious) sedation was employed during this procedure. A total of Versed  1.5 mg and Fentanyl  50 mcg was administered intravenously. Moderate Sedation Time: 24 minutes. The patient's level of consciousness and vital signs were monitored continuously by radiology nursing throughout the procedure under my direct supervision. MEDICATIONS: 2 g IV Ancef . FLUOROSCOPY: Radiation Exposure Index: 2.1 mGy Kerma PROCEDURE: The procedure, risks, benefits, and alternatives were explained to the patient. Questions regarding the procedure were encouraged and answered. The patient understands and consents to the procedure. A timeout was performed prior to initiating the procedure. The right neck and chest were prepped with chlorhexidine  in a sterile fashion, and a sterile drape was applied covering the operative field. Maximum barrier sterile technique with sterile gowns and gloves were used for the procedure. Local anesthesia was provided with 1% lidocaine . Ultrasound was performed to confirm patency of the right internal jugular vein. An ultrasound image was saved and recorded. After  creating a small venotomy incision, a 21 gauge needle was advanced into the right internal jugular vein under direct, real-time ultrasound guidance. After securing guidewire access, an 8 Fr dilator was placed. A J-wire was kinked to measure appropriate catheter length. A Palindrome tunneled hemodialysis catheter measuring 19 cm from tip to cuff was chosen for placement. This was tunneled in a retrograde fashion from the chest wall to the venotomy incision. At the venotomy, serial dilatation was performed and a 15 Fr peel-away sheath was placed over a guidewire. The catheter was then placed through the sheath and the sheath removed. Final catheter positioning was confirmed and documented with a fluoroscopic spot image. The catheter was aspirated, flushed with saline, and injected with appropriate volume heparin  dwells. The venotomy incision was closed with subcutaneous and subcuticular 4-0 Vicryl. Dermabond was applied to the incision. The catheter exit site was secured with 0-Prolene retention sutures. COMPLICATIONS: None.  No pneumothorax. FINDINGS: After catheter placement, the tip lies in the right atrium. The catheter aspirates normally and is ready for immediate use. IMPRESSION: Placement of tunneled hemodialysis catheter via the right internal jugular vein. The catheter tip lies in the right atrium. The catheter is ready for immediate use. Electronically Signed   By: Erica Hau M.D.   On: 09/01/2023 12:16   IR US  Guide Vasc Access Right Result Date: 09/01/2023 CLINICAL DATA:  End-stage renal disease in need for new tunneled  hemodialysis catheter. Status post removal previously placed catheter on 08/29/2023 due to bacteremia. EXAM: TUNNELED CENTRAL VENOUS HEMODIALYSIS CATHETER PLACEMENT WITH ULTRASOUND AND FLUOROSCOPIC GUIDANCE ANESTHESIA/SEDATION: Moderate (conscious) sedation was employed during this procedure. A total of Versed  1.5 mg and Fentanyl  50 mcg was administered intravenously. Moderate  Sedation Time: 24 minutes. The patient's level of consciousness and vital signs were monitored continuously by radiology nursing throughout the procedure under my direct supervision. MEDICATIONS: 2 g IV Ancef . FLUOROSCOPY: Radiation Exposure Index: 2.1 mGy Kerma PROCEDURE: The procedure, risks, benefits, and alternatives were explained to the patient. Questions regarding the procedure were encouraged and answered. The patient understands and consents to the procedure. A timeout was performed prior to initiating the procedure. The right neck and chest were prepped with chlorhexidine  in a sterile fashion, and a sterile drape was applied covering the operative field. Maximum barrier sterile technique with sterile gowns and gloves were used for the procedure. Local anesthesia was provided with 1% lidocaine . Ultrasound was performed to confirm patency of the right internal jugular vein. An ultrasound image was saved and recorded. After creating a small venotomy incision, a 21 gauge needle was advanced into the right internal jugular vein under direct, real-time ultrasound guidance. After securing guidewire access, an 8 Fr dilator was placed. A J-wire was kinked to measure appropriate catheter length. A Palindrome tunneled hemodialysis catheter measuring 19 cm from tip to cuff was chosen for placement. This was tunneled in a retrograde fashion from the chest wall to the venotomy incision. At the venotomy, serial dilatation was performed and a 15 Fr peel-away sheath was placed over a guidewire. The catheter was then placed through the sheath and the sheath removed. Final catheter positioning was confirmed and documented with a fluoroscopic spot image. The catheter was aspirated, flushed with saline, and injected with appropriate volume heparin  dwells. The venotomy incision was closed with subcutaneous and subcuticular 4-0 Vicryl. Dermabond was applied to the incision. The catheter exit site was secured with 0-Prolene  retention sutures. COMPLICATIONS: None.  No pneumothorax. FINDINGS: After catheter placement, the tip lies in the right atrium. The catheter aspirates normally and is ready for immediate use. IMPRESSION: Placement of tunneled hemodialysis catheter via the right internal jugular vein. The catheter tip lies in the right atrium. The catheter is ready for immediate use. Electronically Signed   By: Erica Hau M.D.   On: 09/01/2023 12:16   DG Humerus Left Result Date: 09/01/2023 CLINICAL DATA:  Fracture.  Status post ORIF. EXAM: LEFT HUMERUS - 2+ VIEW COMPARISON:  08/05/2023 FINDINGS: Similar markedly abnormal appearance to the distal humerus status post cortical plate and screw fixation. Study is limited by positioning and as before, bony anatomy is markedly demineralized with possible fragmentation of the distal humerus at the elbow in areas of substantial lucency. IMPRESSION: Limited assessment shows no substantial interval change in fixation hardware although direct comparison between the two studies is difficult due to differential positioning and markedly abnormal appearance of the distal humerus. As noted on the previous exam, given the marked loss of mineralized bone, infection is not excluded. Electronically Signed   By: Donnal Fusi M.D.   On: 09/01/2023 12:05   I spent 24 minutes involved in face-to-face and non-face-to-face activities for this patient on the day of the visit. Professional time spent includes the following activities: Preparing to see the patient (review of tests), Obtaining and reviewing separately obtained history (admission/discharge record), Performing a medically appropriate examination and evaluation , Ordering medications/tests, Documenting clinical information in  the EMR, Independently interpreting results (not separately reported), Communicating results to the patient, Counseling and educating the patient and Care coordination (not separately reported).    Gibson Kurtz,  MSN, NP-C [facility] Pager: [phone]  @TODAY @ 12:42 PM

## 2023-09-02 NOTE — Progress Notes (Signed)
 Overall, I think Frank [name] is doing okay.  It is clear that this protocol has worked very nicely.  His kappa light chain went from 269 down to 27.1.  This could certainly also explain why he is more alert.  We will also check his immunoglobulin levels.  Again I am just very pleased that the light chains have responded.  Again I do believe that his myeloma is improving nicely.  His white cell count is 2.5.  Hemoglobin 8.9.  Platelet count 91,000.  ANC is 1.8.  His sodium is 133.  Potassium 3.4.  BUN 71 creatinine 1.25.  Calcium  9.7 with albumin  of 2.1.  Again he seems to be alert.  Hopefully, he will be able to swallow again.  I do not know if speech pathology is working with him to see about swallowing.  Currently, he is getting feeds through the G-tube.  He has had no problems with nausea or vomiting.  He had the x-ray of his left humerus.  This shows the surgery that he had.  He has had no fever.  Again, hide believe this is reflective of the chemotherapy help in his myeloma.  His current temperature is 99.1.  Pulse 102.  Blood pressure 114/85.  O2 saturation is 94%.  His lungs sound clear bilaterally.  Cardiac exam regular rate and rhythm.  He has no murmurs.  Abdomen soft.  Bowel sounds are present.  He has no fluid wave.  There is no palpable liver or spleen tip.  Extremities shows no clubbing, cyanosis or edema.  He does have the compression devices on his legs.  He has a brace on his left arm.  Frank [name] has IgG kappa myeloma.  We changed his protocol and he is responding very nicely.  He will be interesting to see what his IgG level is.  Again I am unsure what the plans are for discharge.  He still getting dialysis.  His next treatment is on 09/04/2023.  I think after that he gets a week off.  It is apparent that he has had incredible care from everybody up on [address].  [name], MD  Philippians 4:13

## 2023-09-02 NOTE — Plan of Care (Signed)
 Has been calm and cooperative throughout the day but will occasionally yell out at staff.  When is reminded not to yell out but to use his call button, he will say oh I'm sorry, I forgot. Attempted to sit him on the side of the bed with assist of 4 staff but he could not tolerate it and his heart rate went up and he said he could not tolerate it because he felt like he would pass out.    Problem: Health Behavior/Discharge Planning: Goal: Ability to manage health-related needs will improve Outcome: Progressing   Problem: Clinical Measurements: Goal: Will remain free from infection Outcome: Progressing   Problem: Clinical Measurements: Goal: Diagnostic test results will improve Outcome: Progressing   Problem: Clinical Measurements: Goal: Respiratory complications will improve Outcome: Progressing   Problem: Activity: Goal: Risk for activity intolerance will decrease Outcome: Progressing   Problem: Coping: Goal: Level of anxiety will decrease Outcome: Progressing   Problem: Nutrition: Goal: Adequate nutrition will be maintained Outcome: Progressing

## 2023-09-02 NOTE — Progress Notes (Signed)
 Patient ID: Frank [name]., male   DOB: October 05, 1965, 58 y.o.   MRN: [id number] [facility] Progress Note   Assessment/ Plan:    1. Acute kidney Injury on CKD3a: secondary to myeloma kidney. Transitioned to IHD on MWF schedule after previously being on CRRT; dialysis has been a little challenging in the face of hypotension.  Had extra HD on 6/10 to optimize clearance -------------------------------- - MWF HD.  Note predialysis administration of midodrine .  - urine output is better.  Monitoring for renal recovery.  Will hold dialysis tomorrow pending labs.  Not suitable for outpatient dialysis placement yet (inability to sit in recliner).  [name] was decannulated, though, so making strides.  He may recover and be able to come off dialysis - TDC replaced after line holiday on 6/16.  Appreciate help from interventional radiology   2. Acute metabolic encephalopathy: appears to wax and wane per charting  - optimize HD as above   3. Multiple myeloma:  - Closely followed by Dr. Maria Shiner and started on aggressive regimen with Sarclisa , Kyprolis  and Cytoxan  - Appreciate oncology input  4. 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  Note plans to treat through 10/21/23 per ID note  5. Acute hypoxic respiratory failure - with trach removed on 6/11 - pulm following  6. Anemia: from acute illness and multiple myeloma. ESA per oncology   - CBC is ordered per team  7. Bacteremia - noted staph epidermidis from 6/7 blood cultures.   - Repeat blood cultures on 6/9 are NGTD  - Status sling holiday - abx per primary team and ID   Disposition - continue inpatient monitoring     Subjective:   Patient was altered on dialysis yesterday.  Seems more awake and alert today.  2L of ultrafiltration yesterday     Objective:   BP 116/89 (BP Location: Right Arm)   Pulse 96   Temp 98.8 F (37.1 C) (Oral)   Resp 20   Ht 6' 0.01 (1.829 m)   Wt 122 kg    SpO2 100%   BMI 36.47 kg/m   Intake/Output Summary (Last 24 hours) at 09/02/2023 1040 Last data filed at 09/01/2023 2222 Gross per 24 hour  Intake 3 ml  Output 2000 ml  Net -1997 ml   Weight change: -2.3 kg  Physical Exam:       General adult male in bed in no acute distress HEENT normocephalic atraumatic no nasal discharge Lungs bilateral chest rise with no increased work of breathing Heart tachycardic, no rub Abdomen soft nontender nondistended Extremities no edema, warm and well-perfused Neuro, lying in bed, minimally interactive   Imaging: IR Fluoro Guide CV Line Right Result Date: 09/01/2023 CLINICAL DATA:  End-stage renal disease in need for new tunneled hemodialysis catheter. Status post removal previously placed catheter on 08/29/2023 due to bacteremia. EXAM: TUNNELED CENTRAL VENOUS HEMODIALYSIS CATHETER PLACEMENT WITH ULTRASOUND AND FLUOROSCOPIC GUIDANCE ANESTHESIA/SEDATION: Moderate (conscious) sedation was employed during this procedure. A total of Versed  1.5 mg and Fentanyl  50 mcg was administered intravenously. Moderate Sedation Time: 24 minutes. The patient's level of consciousness and vital signs were monitored continuously by radiology nursing throughout the procedure under my direct supervision. MEDICATIONS: 2 g IV Ancef . FLUOROSCOPY: Radiation Exposure Index: 2.1 mGy Kerma PROCEDURE: The procedure, risks, benefits, and alternatives were explained to the patient. Questions regarding the procedure were encouraged and answered. The patient understands and consents to the procedure. A timeout was performed prior to initiating the procedure. The  right neck and chest were prepped with chlorhexidine  in a sterile fashion, and a sterile drape was applied covering the operative field. Maximum barrier sterile technique with sterile gowns and gloves were used for the procedure. Local anesthesia was provided with 1% lidocaine . Ultrasound was performed to confirm patency of the right  internal jugular vein. An ultrasound image was saved and recorded. After creating a small venotomy incision, a 21 gauge needle was advanced into the right internal jugular vein under direct, real-time ultrasound guidance. After securing guidewire access, an 8 Fr dilator was placed. A J-wire was kinked to measure appropriate catheter length. A Palindrome tunneled hemodialysis catheter measuring 19 cm from tip to cuff was chosen for placement. This was tunneled in a retrograde fashion from the chest wall to the venotomy incision. At the venotomy, serial dilatation was performed and a 15 Fr peel-away sheath was placed over a guidewire. The catheter was then placed through the sheath and the sheath removed. Final catheter positioning was confirmed and documented with a fluoroscopic spot image. The catheter was aspirated, flushed with saline, and injected with appropriate volume heparin  dwells. The venotomy incision was closed with subcutaneous and subcuticular 4-0 Vicryl. Dermabond was applied to the incision. The catheter exit site was secured with 0-Prolene retention sutures. COMPLICATIONS: None.  No pneumothorax. FINDINGS: After catheter placement, the tip lies in the right atrium. The catheter aspirates normally and is ready for immediate use. IMPRESSION: Placement of tunneled hemodialysis catheter via the right internal jugular vein. The catheter tip lies in the right atrium. The catheter is ready for immediate use. Electronically Signed   By: Erica Hau M.D.   On: 09/01/2023 12:16   IR US  Guide Vasc Access Right Result Date: 09/01/2023 CLINICAL DATA:  End-stage renal disease in need for new tunneled hemodialysis catheter. Status post removal previously placed catheter on 08/29/2023 due to bacteremia. EXAM: TUNNELED CENTRAL VENOUS HEMODIALYSIS CATHETER PLACEMENT WITH ULTRASOUND AND FLUOROSCOPIC GUIDANCE ANESTHESIA/SEDATION: Moderate (conscious) sedation was employed during this procedure. A total of Versed   1.5 mg and Fentanyl  50 mcg was administered intravenously. Moderate Sedation Time: 24 minutes. The patient's level of consciousness and vital signs were monitored continuously by radiology nursing throughout the procedure under my direct supervision. MEDICATIONS: 2 g IV Ancef . FLUOROSCOPY: Radiation Exposure Index: 2.1 mGy Kerma PROCEDURE: The procedure, risks, benefits, and alternatives were explained to the patient. Questions regarding the procedure were encouraged and answered. The patient understands and consents to the procedure. A timeout was performed prior to initiating the procedure. The right neck and chest were prepped with chlorhexidine  in a sterile fashion, and a sterile drape was applied covering the operative field. Maximum barrier sterile technique with sterile gowns and gloves were used for the procedure. Local anesthesia was provided with 1% lidocaine . Ultrasound was performed to confirm patency of the right internal jugular vein. An ultrasound image was saved and recorded. After creating a small venotomy incision, a 21 gauge needle was advanced into the right internal jugular vein under direct, real-time ultrasound guidance. After securing guidewire access, an 8 Fr dilator was placed. A J-wire was kinked to measure appropriate catheter length. A Palindrome tunneled hemodialysis catheter measuring 19 cm from tip to cuff was chosen for placement. This was tunneled in a retrograde fashion from the chest wall to the venotomy incision. At the venotomy, serial dilatation was performed and a 15 Fr peel-away sheath was placed over a guidewire. The catheter was then placed through the sheath and the sheath removed. Final catheter positioning  was confirmed and documented with a fluoroscopic spot image. The catheter was aspirated, flushed with saline, and injected with appropriate volume heparin  dwells. The venotomy incision was closed with subcutaneous and subcuticular 4-0 Vicryl. Dermabond was applied to  the incision. The catheter exit site was secured with 0-Prolene retention sutures. COMPLICATIONS: None.  No pneumothorax. FINDINGS: After catheter placement, the tip lies in the right atrium. The catheter aspirates normally and is ready for immediate use. IMPRESSION: Placement of tunneled hemodialysis catheter via the right internal jugular vein. The catheter tip lies in the right atrium. The catheter is ready for immediate use. Electronically Signed   By: Erica Hau M.D.   On: 09/01/2023 12:16   DG Humerus Left Result Date: 09/01/2023 CLINICAL DATA:  Fracture.  Status post ORIF. EXAM: LEFT HUMERUS - 2+ VIEW COMPARISON:  08/05/2023 FINDINGS: Similar markedly abnormal appearance to the distal humerus status post cortical plate and screw fixation. Study is limited by positioning and as before, bony anatomy is markedly demineralized with possible fragmentation of the distal humerus at the elbow in areas of substantial lucency. IMPRESSION: Limited assessment shows no substantial interval change in fixation hardware although direct comparison between the two studies is difficult due to differential positioning and markedly abnormal appearance of the distal humerus. As noted on the previous exam, given the marked loss of mineralized bone, infection is not excluded. Electronically Signed   By: Donnal Fusi M.D.   On: 09/01/2023 12:05      Labs: BMET Recent Labs  Lab 08/27/23 0442 08/28/23 0515 08/29/23 0752 08/30/23 1101 08/31/23 1122 09/01/23 0449 09/01/23 0450 09/02/23 0420  NA 131* 131* 128* 128* 127* 127* 128* 133*  132*  K 3.3* 3.5 3.7 3.3* 3.4* 3.5 3.7 3.4*  3.4*  CL 96* 94* 92* 92* 90* 91* 92* 96*  96*  CO2 27 26 23 24 24 26 25 26  25   GLUCOSE 96 86 115* 127* 159* 113* 114* 111*  113*  BUN 65* 60* 100* 88* 117* 133* 133* 71*  72*  CREATININE 1.74* 1.67* 2.54* 2.01* 1.97* 1.89* 1.88* 1.25*  1.25*  CALCIUM  8.6* 8.5* 8.3* 8.4* 9.2 10.3 10.2 9.7  9.5  PHOS 3.2 3.2 3.1 3.1 3.1   --  3.9 2.9   CBC Recent Labs  Lab 08/28/23 0515 08/31/23 1122 09/01/23 0449 09/02/23 0420  WBC 3.3* 3.1* 2.9* 2.5*  NEUTROABS 2.7 2.6 2.3 1.8  HGB 9.3* 8.6* 8.6* 8.9*  HCT 28.8* 25.7* 25.6* 27.0*  MCV 103.6* 101.2* 100.8* 102.3*  PLT 103* 85* 89* 91*    Medications:     acyclovir   200 mg Per Tube BID   arformoterol   15 mcg Nebulization BID   collagenase    Topical Daily   feeding supplement (NEPRO CARB STEADY)  355 mL Per Tube TID PC & HS   feeding supplement (PROSource TF20)  60 mL Per Tube Daily   heparin  injection (subcutaneous)  5,000 Units Subcutaneous Q8H   heparin  sodium (porcine)  10,000 Units Intravenous Once   insulin  aspart  0-15 Units Subcutaneous TID AC & HS   insulin  aspart  2 Units Subcutaneous TID AC & HS   insulin  glargine-yfgn  10 Units Subcutaneous BID   midodrine   10 mg Per Tube Q M,W,F-HD   montelukast   5 mg Per Tube QHS   multivitamin  1 tablet Per Tube QHS   nicotine   7 mg Transdermal Daily   nutrition supplement (JUVEN)  1 packet Per Tube BID BM   mouth rinse  15 mL Mouth Rinse 4 times per day   PARoxetine   40 mg Per Tube Daily   pneumococcal 20-valent conjugate vaccine  0.5 mL Intramuscular Tomorrow-1000   polyethylene glycol  17 g Per Tube BID   revefenacin   175 mcg Nebulization Daily   senna  1 tablet Per Tube Daily   sodium chloride  flush  10-40 mL Intracatheter Q12H   voriconazole   350 mg Per Tube Q12H    Levorn Reason, MD 09/02/2023, 10:40 AM

## 2023-09-02 NOTE — Progress Notes (Signed)
 Pharmacy Antibiotic Note  Frank [name]. is a 58 y.o. male admitted on 07/03/2023 with generalized weakness (multifactorial secondary to hypercalcemia, suspected malignancy, multiple myeloma). 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 Pharmacy now consulted for vancomycin  dosing for intermittent fever and new BCID results showing MRSE. AKI on CKD - patient required CRRT from 4/23-5/10 >> transitioned to [facility] MWF.   New HD line placed 6/16 with dialysis conducted afterwards. Pre-HD Vr at goal of 19. Vancomycin  not administered after session. HD line removed 6/13. Nephrology notes that patient may be able to come off of dialysis, still having some residual UOP.   Plan: Extra vancomycin  1000mg  IV today (Mon dose) Continue vancomycin  1000 mg IV MWF qHD Continue Voriconazole  350 mg per tube Q12H  Continue Acyclovir  200 mg per tube BID  Follow up cultures, fever curve, clinical status  F/U nephro/ID recommendations   Height: 6' 0.01 (182.9 cm) Weight: 122 kg (268 lb 15.4 oz) IBW/kg (Calculated) : 77.62  Temp (24hrs), Avg:98.4 F (36.9 C), Min:97.5 F (36.4 C), Max:99.1 F (37.3 C)  Recent Labs  Lab 08/27/23 0442 08/28/23 0515 08/29/23 0752 08/30/23 1101 08/31/23 1122 09/01/23 0449 09/01/23 0450 09/02/23 0420  WBC 2.8* 3.3*  --   --  3.1* 2.9*  --  2.5*  CREATININE 1.74* 1.67*   < > 2.01* 1.97* 1.89* 1.88* 1.25*  1.25*  VANCORANDOM  --   --   --   --   --  19  --   --    < > = values in this interval not displayed.    Estimated Creatinine Clearance: 85.9 mL/min (A) (by C-G formula based on SCr of 1.25 mg/dL (H)).    Allergies  Allergen Reactions   Morphine And Codeine Anaphylaxis   Shellfish Allergy Anaphylaxis   Betadine  [Povidone Iodine ] Itching   Bupropion Other (See Comments)    Shaking of the body, hallucinations    Chlorhexidine  Hives    Patient reports never had issues CHG with mouth rinse.   Influenza Vaccines Hives   Metoprolol   Rash    Antimicrobials this admission: 4/17 R/Z >> 4/22 Zosyn  >> 5/1 Meropenem  >> 5/6 Levaquin  >> 5/12 4/28 Vanco >> 5/5 4/18 Fluconazole  >> 4/23 5/4 Voriconazole  >> 5/6>> reinitiated 5/10 >> - 08/01/23 1.9 mcg/ml on 350 mg/12h - 08/08/23 0.4 mcg/ml on 350 mg/12h - 08/19/22 1.3 mcg/ml >> continue 350 mg/12h 5/10 Unasyn  >> 5/11  5/11 vancomycin  >> 5/16 Acyclovir  ppx 5/24 >> Vancomycin  6/8 >>  Microbiology results: 4/19: MRSA PCR: neg 4/19 UCx: ngF 4/19 BCx: ngF 4/23 TA: few Staph epi: MRSE 4/28 BCx: ngF 4/30 TA: rare aspergillus fumigatus 5/2 CSF: ngtd 5/4 TA: rare mold, aspergillus [place]  5/5 TA PJP stain: neg 5/5 MRSA PCR: neg 5/5 BCx: NGTD 5/9 tracheal asp: abundant staph haemolyticus + mold, aspergillus [place]  5/11 BCx >> ngtd 5/15 TA: sent 6/7 UCx: ng 6/7 BCx: MRSE (1 of 3 bottles) 6/9 blood>>ngtd   Ivery Marking, PharmD, [place], AAHIVP, CPP Infectious Disease Pharmacist 09/02/2023 8:30 AM

## 2023-09-02 NOTE — Progress Notes (Signed)
 Pharmacy Antibiotic Note  Kourosh Jablonsky. is a 58 y.o. male admitted on 07/03/2023 with generalized weakness (multifactorial secondary to hypercalcemia, suspected malignancy, multiple myeloma).  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   The patient had two recent voriconazole  levels drawn. A level on 6/14 that resulted at 1.8 mcg/ml - notably drawn 1 hour AFTER the dose was given and a level of 0.6 mcg/ml drawn 14 hours after the prior dose. Assumed level is still >1 mcg/ml and at goal of 1-5 mcg/ml. Will hold the course at the current dose for now.   Level history: 08/01/23 1.9 mcg/ml on 350 mg/12h 08/08/23 0.4 mcg/ml on 350 mg/12h (low due to tube feed binding) 08/19/23 1.3 mcg/ml continue 350 mg/12h 08/30/23 1.8 mcg/ml (drawn AFTER the dose) 08/31/23 0.6 mcg/ml drawn prior to the dose but ~2 hours late of true trough  The patient continues on bolus feeds - timing adjusted to avoid 1hr before/after voriconazole  administration. The patient's trach has been removed - will monitor plans for SLP eval and oral intake advancement.  Of noting, the patient is on Vancomycin  for MRSE bacteremia thought to be related to [facility] - removed on 6/13. Dr. Gillian Lacrosse wants a 7d course from removal, EOT 6/19. Will add stop date after the dose post HD on Wed, 6/18.   Plan: - Continue voriconazole  350 mg BID per tube - Will continue to monitor voriconazole  and levels as indicated  Height: 6' 0.01 (182.9 cm) Weight: 122 kg (268 lb 15.4 oz) IBW/kg (Calculated) : 77.62  Temp (24hrs), Avg:98.4 F (36.9 C), Min:97.5 F (36.4 C), Max:99.1 F (37.3 C)  Recent Labs  Lab 08/27/23 0442 08/28/23 0515 08/29/23 0752 08/30/23 1101 08/31/23 1122 09/01/23 0449 09/01/23 0450 09/02/23 0420  WBC 2.8* 3.3*  --   --  3.1* 2.9*  --  2.5*  CREATININE 1.74* 1.67*   < > 2.01* 1.97* 1.89* 1.88* 1.25*  1.25*  VANCORANDOM  --   --   --   --   --  19  --    --    < > = values in this interval not displayed.    Estimated Creatinine Clearance: 85.9 mL/min (A) (by C-G formula based on SCr of 1.25 mg/dL (H)).    Allergies  Allergen Reactions   Morphine And Codeine Anaphylaxis   Shellfish Allergy Anaphylaxis   Betadine  [Povidone Iodine ] Itching   Bupropion Other (See Comments)    Shaking of the body, hallucinations    Chlorhexidine  Hives    Patient reports never had issues CHG with mouth rinse.   Influenza Vaccines Hives   Metoprolol  Rash     Thank you for allowing pharmacy to be a part of this patient's care.  Garland Junk, PharmD, BCPS, BCIDP Infectious Diseases Clinical Pharmacist 09/02/2023 2:38 PM   **Pharmacist phone directory can now be found on [email or web address] ([email or web address]).  Listed under [facility].

## 2023-09-02 NOTE — Progress Notes (Signed)
 Physical Therapy Treatment Patient Details Name: Frank [name]. MRN: [id number] DOB: 1966/03/10 Today's Date: 09/02/2023   History of Present Illness 58 y.o. male transferred from [facility] 07/03/23 to [facility] for management of newly diagnosed plasma cell myeloma with weakness and AMS. Pt also with AKI and metabolic encephalopathy. 5/10 transfer to [facility] for iHD. 4/22 Intubated and chemo initiated. 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 5/6 IVIG started. 5/8-6/11 trach. 5/12 iHD initiated. 5/29 PEG placed. 6/5 chemo initiated. PMHx:Lt THA, carpal tunnel syndrome, Lt TKA, Lt humerus fx, PAF, HTN, HLD, bipolar disorder, obesity, T2DM    PT Comments  Pt with fair tolerance to treatment today. Pt able to perform 2 sit to stands in stedy with +3 assistance. Once seated in stedy pt noted with poor trunk control and appeared to be having an anxiety attack with pt noted to have hyperventilation, increased HR, and decreased responsiveness. Session mostly limited by massive bowel incontinence. No change in DC/DME recs at this time. PT will continue to follow.     If plan is discharge home, recommend the following: Two people to help with walking and/or transfers;Assistance with cooking/housework;Assist for transportation;Two people to help with bathing/dressing/bathroom;Direct supervision/assist for medications management;Direct supervision/assist for financial management;Help with stairs or ramp for entrance;Assistance with feeding   Can travel by private vehicle     No  Equipment Recommendations  Hospital bed;Hoyer lift;Wheelchair (measurements PT);Wheelchair cushion (measurements PT);BSC/3in1    Recommendations for Other Services       Precautions / Restrictions Precautions Precautions: Fall Recall of Precautions/Restrictions: Impaired Restrictions Weight Bearing Restrictions Per Provider Order: Yes LUE Weight Bearing Per Provider Order: Non weight bearing     Mobility  Bed Mobility Overal bed  mobility: Needs Assistance Bed Mobility: Rolling, Supine to Sit, Sit to Supine Rolling: Total assist   Supine to sit: +2 for physical assistance, Max assist Sit to supine: +2 for physical assistance, Total assist   General bed mobility comments: +2 total A via helicopter method. total A to roll for pericare.    Transfers Overall transfer level: Needs assistance Equipment used: None Transfers: Sit to/from Stand Sit to Stand: +2 physical assistance, Total assist, Via lift equipment           General transfer comment: Pt able to perform 2 sit to stands in stedy with +3 assistance. Once seated in stedy pt noted with poor trunk control and appeared to be having an anxiety attack with pt noted to have hyperventilation, increased HR, and decreased responsiveness. Transfer via Lift Equipment: Stedy  Ambulation/Gait               General Gait Details: Deferred at this time   Optometrist     Tilt Bed    Modified Rankin (Stroke Patients Only)       Balance Overall balance assessment: Needs assistance Sitting-balance support: Single extremity supported, No upper extremity supported, Feet supported Sitting balance-Leahy Scale: Poor Sitting balance - Comments: Max A with heavy R lateral lean   Standing balance support: Single extremity supported, During functional activity, Reliant on assistive device for balance Standing balance-Leahy Scale: Zero                              Communication Communication Communication: Impaired Factors Affecting Communication: Reduced clarity of speech  Cognition Arousal: Alert Behavior During Therapy: Anxious   PT -  Cognitive impairments: Difficult to assess Difficult to assess due to: Impaired communication                     PT - Cognition Comments: Anxious with mobility Following commands: Impaired Following commands impaired: Follows one step commands with increased  time    Cueing Cueing Techniques: Verbal cues  Exercises      General Comments General comments (skin integrity, edema, etc.): HR increased with activity.      Pertinent Vitals/Pain Pain Assessment Pain Assessment: Faces Faces Pain Scale: Hurts even more Pain Location: unspecified Pain Descriptors / Indicators: Grimacing Pain Intervention(s): Monitored during session, Limited activity within patient's tolerance, Repositioned    Home Living                          Prior Function            PT Goals (current goals can now be found in the care plan section) Progress towards PT goals: Progressing toward goals    Frequency    Min 1X/week      PT Plan      Co-evaluation              AM-PAC PT 6 Clicks Mobility   Outcome Measure  Help needed turning from your back to your side while in a flat bed without using bedrails?: Total Help needed moving from lying on your back to sitting on the side of a flat bed without using bedrails?: Total Help needed moving to and from a bed to a chair (including a wheelchair)?: Total Help needed standing up from a chair using your arms (e.g., wheelchair or bedside chair)?: Total Help needed to walk in hospital room?: Total Help needed climbing 3-5 steps with a railing? : Total 6 Click Score: 6    End of Session Equipment Utilized During Treatment: Gait belt Activity Tolerance: Patient limited by fatigue;Patient limited by pain;Other (comment) (Limited by bowel incontinence) Patient left: in bed;with call bell/phone within reach;Other (comment);with nursing/sitter in room Nurse Communication: Mobility status PT Visit Diagnosis: Other abnormalities of gait and mobility (R26.89);Muscle weakness (generalized) (M62.81);Difficulty in walking, not elsewhere classified (R26.2);Unsteadiness on feet (R26.81)     Time: 1610-9604 PT Time Calculation (min) (ACUTE ONLY): 53 min  Charges:    $Therapeutic Activity: 53-67  mins PT General Charges $$ ACUTE PT VISIT: 1 Visit                     Rodgers Clack, PT, DPT Acute Rehab Services [phone]    Frank [name] 09/02/2023, 3:46 PM

## 2023-09-02 NOTE — Progress Notes (Signed)
 PROGRESS NOTE    Frank [name].  FAO:[id number] DOB: June 07, 1965 DOA: 07/03/2023 PCP: Serita Danes, MD   Brief Narrative: 58 year old PMH OSA CPAP, pathological fracture and multiple ORIF left Humerus likely due to multiple myeloma, A-fib not on anticoagulation, hyperlipidemia, tobacco use disorder who was  feeling weak for 1 week, admitted  to [facility] health with concern of sepsis, community-acquired pneumonia and acute encephalopathy.  Patient was found to have lytic lesions,  he was transferred to [facility] for  oncology treatment.  In the meantime he developed encephalopathy, acute renal failure.  He remained in the ICU for 36 days.  Underwent tracheostomy. He was also started on HD after  CRRT.  Subsequently PEG tube placed 5/29.  Significant events 4/17-Admitted to medical floor with oncology consultation to treat multiple myeloma. 4/19-transferred to ICU intubated, encephalopathic. 4/20-A-fib with RVR. Started heparin . Groin and biopsy site hematoma requiring multiple transfusions. EEG 4/25 >> moderate to severe encephalopathy without any seizures or epileptiform discharges. 4/29 Received Velcade  and Cytoxan  chemotherapy-intent palliative? 5/2 LP and ETT exchange  5/8 Trach placed. Some post op bleeding improved with thrombi-pad 5/10 Voriconazole  added back based on CT Chest findings 5/13 more alert and able to follow some commands. 5/23, transferred to floor. 5/29-PEG placed 5/31 chemotherapy changed to Sarclisa /Kyprolis /Cytoxan . 6/13 plan to remove HD cathter for holiday line 6/16 plan to place new HD catheter.     Assessment & Plan:   Principal Problem:   Multiple myeloma not having achieved remission (HCC) Active Problems:   AKI (acute kidney injury) (HCC)   Acute hypoxemic respiratory failure (HCC)   Goals of care, counseling/discussion   Acute metabolic encephalopathy   Lobar pneumonia, unspecified organism (HCC)   Fever   Aspergillosis, unspecified (HCC)    Hematoma of right thigh   Generalized weakness   Atrial fibrillation with rapid ventricular response (HCC)   PAF (paroxysmal atrial fibrillation) (HCC)   On mechanically assisted ventilation (HCC)   Multiple myeloma without remission (HCC)   Protein-calorie malnutrition, severe   Malfunction of tracheostomy stoma (HCC)   Seizure disorder (HCC)   DMII (diabetes mellitus, type 2) (HCC)  1-Recurrent fever, SIRS, sepsis, Staph Epi Bacteremia:  - Intermittent fevers.  ID recommends treatment for Bacteremia.  - Blood cultures drawn 6/7 1 out of 3 bottles with Staph Epi  -ID recommend  IV antibiotics, vancomycin   -Hemodialysis cath removed on  6/13  for holiday line - Repeated blood culture 6/9: No growth to date - Unstageable Sacral Ulcer needs to be monitored as well; continue WOC rec's -Remain Afebrile.   Aspergillosis with pneumonia -On Voriconazole  per ID -On Acyclovir   Multiple myeloma - He was  started on Velcade  and Cytoxan .  Chemotherapy changed to Sarclisa /Kyprolis /Cytoxan  on 5/31  -Dr Maria Shiner following.  Getting Chemo, 3 weeks on and 1 week off. Next tx 6/19  Goals of care - Unfortunately poor long-term prognosis - Palliative care has discussed with wife previously.  Remain full code on full scope of care  Acute Hypoxic Respiratory Failure: - Pulmonology following intermittently  -Trach removed 6/11 -Currently RA.   Right Thigh Hematoma -per CT on 08/01/23: 12.1 x 30.2 x 7.6 cm subcutaneous hematoma within the anterior right thigh. 8.4 x 10.4 x 22.2 cm subcutaneous hematoma within the right gluteal region - continue supportive care -Hb 8.9 monitor.   Severe acute metabolic encephalopathy with hypoactive delirium, seizure disorder - Normal MRI of the brain.  LP negative for meningitis or encephalitis.  EEG negative for  seizures but encephalopathy. - Home Depakote  discontinued in ICU.   Insulin -dependent diabetes type 2 with hyperglycemia - A1c 6.0 - Continue with  sliding scale insulin   AKI on CKD stage IIIa, Uremia,  Hyponatremia, Hypokalemia -Due to MM, hypercalcemia and ACE inhibitors?  Requiring HD.  Needs to be able to sit in recliner to qualifies fro out patient HD>  Needs to work with PT   Paroxysmal A-fib - Unable to  anticoagulation due to hematoma. Rate controlled.   Obstructive Sleep Apnea:  -Trach removed  Anxiety/Bipolar disorder.  - Continue with  Paxil  and Depakote   Severe Malnutrition:  - Continue tube feeding.  History of Pathological fracture and multiple ORIF left Humerus 01/2023  Hypokalemia; correction with hd   Hyponatremia; Correction with HD.  See wound care Documentation below.   Pressure Injury 07/05/23 Heel Left;Right Unstageable - Full thickness tissue loss in which the base of the injury is covered by slough (yellow, tan, gray, green or brown) and/or eschar (tan, brown or black) in the wound bed. (Active)  07/05/23 1108  Location: Heel  Location Orientation: Left;Right  Staging: Unstageable - Full thickness tissue loss in which the base of the injury is covered by slough (yellow, tan, gray, green or brown) and/or eschar (tan, brown or black) in the wound bed.  Wound Description (Comments):   DO NOT USE:  Present on Admission: Yes  Dressing Type Foam - Lift dressing to assess site every shift 09/01/23 2020     Pressure Injury 07/11/23 Sacrum Mid Unstageable - Full thickness tissue loss in which the base of the injury is covered by slough (yellow, tan, gray, green or brown) and/or eschar (tan, brown or black) in the wound bed. (Active)  07/11/23 1600  Location: Sacrum  Location Orientation: Mid  Staging: Unstageable - Full thickness tissue loss in which the base of the injury is covered by slough (yellow, tan, gray, green or brown) and/or eschar (tan, brown or black) in the wound bed.  Wound Description (Comments):   DO NOT USE:  Present on Admission: No  Dressing Type Foam - Lift dressing to assess site  every shift 09/01/23 2020     Pressure Injury 07/27/23 Buttocks Right;Lower (Active)  07/27/23 1700  Location: Buttocks  Location Orientation: Right;Lower  Staging:   Wound Description (Comments):   DO NOT USE:  Present on Admission: Yes  Dressing Type Foam - Lift dressing to assess site every shift 09/01/23 2020     Pressure Injury 08/25/23 Buttocks Left Stage 2 -  Partial thickness loss of dermis presenting as a shallow open injury with a red, pink wound bed without slough. (Active)  08/25/23   Location: Buttocks  Location Orientation: Left  Staging: Stage 2 -  Partial thickness loss of dermis presenting as a shallow open injury with a red, pink wound bed without slough.  Wound Description (Comments):   DO NOT USE:  Present on Admission: No  Dressing Type Foam - Lift dressing to assess site every shift 09/01/23 2020     Nutrition Problem: Severe Malnutrition Etiology: acute illness (prolonged illness, interuptions in feeding)    Signs/Symptoms: moderate fat depletion, moderate muscle depletion, percent weight loss (9.6% x 1 month) Percent weight loss: 9.6 %    Interventions: Prostat, Tube feeding, MVI  Estimated body mass index is 36.47 kg/m as calculated from the following:   Height as of this encounter: 6' 0.01 (1.829 m).   Weight as of this encounter: 122 kg.   DVT prophylaxis: Heparin  Code  Status: Full code Family Communication: Wife over phone 6/12 Disposition Plan:  Status is: Inpatient Remains inpatient appropriate because: Management of encephalopathy: Debilitated, hemodialysis, Needs to be able to sit for HD    Consultants:  Oncology CCM ID  Procedures:    Antimicrobials:  Voriconazole  08/05/2023 >> current Vancomycin  08/24/2023 >> current  Subjective: He is alert, denies pain, flat affect  Objective: Vitals:   09/01/23 2034 09/01/23 2344 09/02/23 0347 09/02/23 0500  BP:  (!) 116/92 114/85   Pulse:  (!) 108 (!) 102   Resp:  16 18   Temp:   98.9 F (37.2 C) 99.1 F (37.3 C)   TempSrc:  Oral Oral   SpO2: 97% 97% (!) 89%   Weight:    122 kg  Height:        Intake/Output Summary (Last 24 hours) at 09/02/2023 0711 Last data filed at 09/01/2023 2222 Gross per 24 hour  Intake 3 ml  Output 2000 ml  Net -1997 ml   Filed Weights   09/01/23 0500 09/01/23 1929 09/02/23 0500  Weight: 129 kg 126.7 kg 122 kg    Examination:  General exam: NAD, chronic ill appearing Respiratory system:  BL air movement Cardiovascular system: S 1, S 2 RRR Gastrointestinal system: BS present, soft, nt, peg tube in place Central nervous system: alert, answer questions Extremities: no edema   Data Reviewed: I have personally reviewed following labs and imaging studies  CBC: Recent Labs  Lab 08/27/23 0442 08/28/23 0515 08/31/23 1122 09/01/23 0449 09/02/23 0420  WBC 2.8* 3.3* 3.1* 2.9* 2.5*  NEUTROABS 2.1 2.7 2.6 2.3 1.8  HGB 9.0* 9.3* 8.6* 8.6* 8.9*  HCT 28.0* 28.8* 25.7* 25.6* 27.0*  MCV 103.3* 103.6* 101.2* 100.8* 102.3*  PLT 106* 103* 85* 89* 91*   Basic Metabolic Panel: Recent Labs  Lab 08/27/23 0442 08/28/23 0515 08/29/23 0752 08/30/23 1101 08/31/23 1122 09/01/23 0449 09/01/23 0450 09/02/23 0420  NA 131*   < > 128* 128* 127* 127* 128* 133*  132*  K 3.3*   < > 3.7 3.3* 3.4* 3.5 3.7 3.4*  3.4*  CL 96*   < > 92* 92* 90* 91* 92* 96*  96*  CO2 27   < > 23 24 24 26 25 26  25   GLUCOSE 96   < > 115* 127* 159* 113* 114* 111*  113*  BUN 65*   < > 100* 88* 117* 133* 133* 71*  72*  CREATININE 1.74*   < > 2.54* 2.01* 1.97* 1.89* 1.88* 1.25*  1.25*  CALCIUM  8.6*   < > 8.3* 8.4* 9.2 10.3 10.2 9.7  9.5  MG 2.3  --   --   --   --   --   --   --   PHOS 3.2   < > 3.1 3.1 3.1  --  3.9 2.9   < > = values in this interval not displayed.   GFR: Estimated Creatinine Clearance: 85.9 mL/min (A) (by C-G formula based on SCr of 1.25 mg/dL (H)). Liver Function Tests: Recent Labs  Lab 08/28/23 0515 08/29/23 0752 08/30/23 1101  08/31/23 1122 09/01/23 0449 09/01/23 0450 09/02/23 0420  AST 21  --   --  12* 11*  --  12*  ALT 16  --   --  11 11  --  10  ALKPHOS 55  --   --  49 51  --  65  BILITOT 0.6  --   --  0.6 0.7  --  0.6  PROT 6.5  --   --  5.9* 5.7*  --  5.9*  ALBUMIN  2.4*  2.4*   < > 2.1* 2.1* 2.0* 2.0* 2.1*  2.1*   < > = values in this interval not displayed.   No results for input(s): LIPASE, AMYLASE in the last 168 hours. No results for input(s): AMMONIA in the last 168 hours. Coagulation Profile: No results for input(s): INR, PROTIME in the last 168 hours. Cardiac Enzymes: No results for input(s): CKTOTAL, CKMB, CKMBINDEX, TROPONINI in the last 168 hours. BNP (last 3 results) No results for input(s): PROBNP in the last 8760 hours. HbA1C: No results for input(s): HGBA1C in the last 72 hours. CBG: Recent Labs  Lab 09/01/23 0807 09/01/23 1134 09/01/23 2006 09/01/23 2346 09/02/23 0353  GLUCAP 96 113* 113* 186* 124*   Lipid Profile: No results for input(s): CHOL, HDL, LDLCALC, TRIG, CHOLHDL, LDLDIRECT in the last 72 hours. Thyroid Function Tests: No results for input(s): TSH, T4TOTAL, FREET4, T3FREE, THYROIDAB in the last 72 hours. Anemia Panel: No results for input(s): VITAMINB12, FOLATE, FERRITIN, TIBC, IRON, RETICCTPCT in the last 72 hours. Sepsis Labs: No results for input(s): PROCALCITON, LATICACIDVEN in the last 168 hours.  Recent Results (from the past 240 hours)  Culture, blood (Routine X 2) w Reflex to ID Panel     Status: None   Collection Time: 08/23/23  9:35 AM   Specimen: BLOOD LEFT HAND  Result Value Ref Range Status   Specimen Description BLOOD LEFT HAND  Final   Special Requests   Final    BOTTLES DRAWN AEROBIC ONLY Blood Culture adequate volume   Culture   Final    NO GROWTH 5 DAYS Performed at [facility], [address] [place], [place] [address]    Report Status 08/28/2023 FINAL  Final   Culture, blood (Routine X 2) w Reflex to ID Panel     Status: Abnormal   Collection Time: 08/23/23  9:35 AM   Specimen: BLOOD RIGHT ARM  Result Value Ref Range Status   Specimen Description BLOOD RIGHT ARM  Final   Special Requests   Final    BOTTLES DRAWN AEROBIC AND ANAEROBIC Blood Culture adequate volume   Culture  Setup Time   Final    GRAM POSITIVE COCCI IN TETRADS AEROBIC BOTTLE ONLY CRITICAL RESULT CALLED TO, READ BACK BY AND VERIFIED WITH: PHARMD T. DANG [id number] AT 1330 BY EC    Culture (A)  Final    STAPHYLOCOCCUS EPIDERMIDIS THE SIGNIFICANCE OF ISOLATING THIS ORGANISM FROM A SINGLE SET OF BLOOD CULTURES WHEN MULTIPLE SETS ARE DRAWN IS UNCERTAIN. PLEASE NOTIFY THE MICROBIOLOGY DEPARTMENT WITHIN ONE WEEK IF SPECIATION AND SENSITIVITIES ARE REQUIRED. Performed at [facility], [address] [place], [place] [address]    Report Status 08/25/2023 FINAL  Final  Blood Culture ID Panel (Reflexed)     Status: Abnormal   Collection Time: 08/23/23  9:35 AM  Result Value Ref Range Status   Enterococcus faecalis NOT DETECTED NOT DETECTED Final   Enterococcus Faecium NOT DETECTED NOT DETECTED Final   Listeria monocytogenes NOT DETECTED NOT DETECTED Final   Staphylococcus species DETECTED (A) NOT DETECTED Final    Comment: CRITICAL RESULT CALLED TO, READ BACK BY AND VERIFIED WITH: PHARMD T. DANG [id number] AT 1330 BY EC    Staphylococcus aureus (BCID) NOT DETECTED NOT DETECTED Final   Staphylococcus epidermidis DETECTED (A) NOT DETECTED Final    Comment: Methicillin (oxacillin) resistant coagulase negative staphylococcus. Possible blood  culture contaminant (unless isolated from more than one blood culture draw or clinical case suggests pathogenicity). No antibiotic treatment is indicated for blood  culture contaminants. CRITICAL RESULT CALLED TO, READ BACK BY AND VERIFIED WITH: PHARMD T. DANG [id number] AT 1330 BY EC    Staphylococcus lugdunensis NOT DETECTED NOT DETECTED Final    Streptococcus species NOT DETECTED NOT DETECTED Final   Streptococcus agalactiae NOT DETECTED NOT DETECTED Final   Streptococcus pneumoniae NOT DETECTED NOT DETECTED Final   Streptococcus pyogenes NOT DETECTED NOT DETECTED Final   A.calcoaceticus-baumannii NOT DETECTED NOT DETECTED Final   Bacteroides fragilis NOT DETECTED NOT DETECTED Final   Enterobacterales NOT DETECTED NOT DETECTED Final   Enterobacter cloacae complex NOT DETECTED NOT DETECTED Final   Escherichia coli NOT DETECTED NOT DETECTED Final   Klebsiella aerogenes NOT DETECTED NOT DETECTED Final   Klebsiella oxytoca NOT DETECTED NOT DETECTED Final   Klebsiella pneumoniae NOT DETECTED NOT DETECTED Final   Proteus species NOT DETECTED NOT DETECTED Final   Salmonella species NOT DETECTED NOT DETECTED Final   Serratia marcescens NOT DETECTED NOT DETECTED Final   Haemophilus influenzae NOT DETECTED NOT DETECTED Final   Neisseria meningitidis NOT DETECTED NOT DETECTED Final   Pseudomonas aeruginosa NOT DETECTED NOT DETECTED Final   Stenotrophomonas maltophilia NOT DETECTED NOT DETECTED Final   Candida albicans NOT DETECTED NOT DETECTED Final   Candida auris NOT DETECTED NOT DETECTED Final   Candida glabrata NOT DETECTED NOT DETECTED Final   Candida krusei NOT DETECTED NOT DETECTED Final   Candida parapsilosis NOT DETECTED NOT DETECTED Final   Candida tropicalis NOT DETECTED NOT DETECTED Final   Cryptococcus neoformans/gattii NOT DETECTED NOT DETECTED Final   Methicillin resistance mecA/C DETECTED (A) NOT DETECTED Final    Comment: CRITICAL RESULT CALLED TO, READ BACK BY AND VERIFIED WITH: PHARMD T. DANG [phone] AT 1330 BY EC Performed at [facility], [address], [place], [place] [address]   Culture, blood (Routine X 2) w Reflex to ID Panel     Status: None   Collection Time: 08/25/23 11:09 AM   Specimen: BLOOD RIGHT ARM  Result Value Ref Range Status   Specimen Description BLOOD RIGHT ARM  Final   Special  Requests   Final    BOTTLES DRAWN AEROBIC ONLY Blood Culture adequate volume   Culture   Final    NO GROWTH 5 DAYS Performed at [facility], [address] [place], [place] [address]    Report Status 08/30/2023 FINAL  Final  Culture, blood (Routine X 2) w Reflex to ID Panel     Status: None   Collection Time: 08/25/23 11:10 AM   Specimen: BLOOD LEFT HAND  Result Value Ref Range Status   Specimen Description BLOOD LEFT HAND  Final   Special Requests   Final    BOTTLES DRAWN AEROBIC ONLY Blood Culture results may not be optimal due to an inadequate volume of blood received in culture bottles   Culture   Final    NO GROWTH 5 DAYS Performed at [facility], [address], [place], [place] [address]    Report Status 08/30/2023 FINAL  Final         Radiology Studies: IR Fluoro Guide CV Line Right Result Date: 09/01/2023 CLINICAL DATA:  End-stage renal disease in need for new tunneled hemodialysis catheter. Status post removal previously placed catheter on 08/29/2023 due to bacteremia. EXAM: TUNNELED CENTRAL VENOUS HEMODIALYSIS CATHETER PLACEMENT WITH ULTRASOUND AND FLUOROSCOPIC GUIDANCE  ANESTHESIA/SEDATION: Moderate (conscious) sedation was employed during this procedure. A total of Versed  1.5 mg and Fentanyl  50 mcg was administered intravenously. Moderate Sedation Time: 24 minutes. The patient's level of consciousness and vital signs were monitored continuously by radiology nursing throughout the procedure under my direct supervision. MEDICATIONS: 2 g IV Ancef . FLUOROSCOPY: Radiation Exposure Index: 2.1 mGy Kerma PROCEDURE: The procedure, risks, benefits, and alternatives were explained to the patient. Questions regarding the procedure were encouraged and answered. The patient understands and consents to the procedure. A timeout was performed prior to initiating the procedure. The right neck and chest were prepped with chlorhexidine  in a sterile fashion, and a sterile drape was  applied covering the operative field. Maximum barrier sterile technique with sterile gowns and gloves were used for the procedure. Local anesthesia was provided with 1% lidocaine . Ultrasound was performed to confirm patency of the right internal jugular vein. An ultrasound image was saved and recorded. After creating a small venotomy incision, a 21 gauge needle was advanced into the right internal jugular vein under direct, real-time ultrasound guidance. After securing guidewire access, an 8 Fr dilator was placed. A J-wire was kinked to measure appropriate catheter length. A Palindrome tunneled hemodialysis catheter measuring 19 cm from tip to cuff was chosen for placement. This was tunneled in a retrograde fashion from the chest wall to the venotomy incision. At the venotomy, serial dilatation was performed and a 15 Fr peel-away sheath was placed over a guidewire. The catheter was then placed through the sheath and the sheath removed. Final catheter positioning was confirmed and documented with a fluoroscopic spot image. The catheter was aspirated, flushed with saline, and injected with appropriate volume heparin  dwells. The venotomy incision was closed with subcutaneous and subcuticular 4-0 Vicryl. Dermabond was applied to the incision. The catheter exit site was secured with 0-Prolene retention sutures. COMPLICATIONS: None.  No pneumothorax. FINDINGS: After catheter placement, the tip lies in the right atrium. The catheter aspirates normally and is ready for immediate use. IMPRESSION: Placement of tunneled hemodialysis catheter via the right internal jugular vein. The catheter tip lies in the right atrium. The catheter is ready for immediate use. Electronically Signed   By: Erica Hau M.D.   On: 09/01/2023 12:16   IR US  Guide Vasc Access Right Result Date: 09/01/2023 CLINICAL DATA:  End-stage renal disease in need for new tunneled hemodialysis catheter. Status post removal previously placed catheter on  08/29/2023 due to bacteremia. EXAM: TUNNELED CENTRAL VENOUS HEMODIALYSIS CATHETER PLACEMENT WITH ULTRASOUND AND FLUOROSCOPIC GUIDANCE ANESTHESIA/SEDATION: Moderate (conscious) sedation was employed during this procedure. A total of Versed  1.5 mg and Fentanyl  50 mcg was administered intravenously. Moderate Sedation Time: 24 minutes. The patient's level of consciousness and vital signs were monitored continuously by radiology nursing throughout the procedure under my direct supervision. MEDICATIONS: 2 g IV Ancef . FLUOROSCOPY: Radiation Exposure Index: 2.1 mGy Kerma PROCEDURE: The procedure, risks, benefits, and alternatives were explained to the patient. Questions regarding the procedure were encouraged and answered. The patient understands and consents to the procedure. A timeout was performed prior to initiating the procedure. The right neck and chest were prepped with chlorhexidine  in a sterile fashion, and a sterile drape was applied covering the operative field. Maximum barrier sterile technique with sterile gowns and gloves were used for the procedure. Local anesthesia was provided with 1% lidocaine . Ultrasound was performed to confirm patency of the right internal jugular vein. An ultrasound image was saved and recorded. After creating a small venotomy incision, a 21  gauge needle was advanced into the right internal jugular vein under direct, real-time ultrasound guidance. After securing guidewire access, an 8 Fr dilator was placed. A J-wire was kinked to measure appropriate catheter length. A Palindrome tunneled hemodialysis catheter measuring 19 cm from tip to cuff was chosen for placement. This was tunneled in a retrograde fashion from the chest wall to the venotomy incision. At the venotomy, serial dilatation was performed and a 15 Fr peel-away sheath was placed over a guidewire. The catheter was then placed through the sheath and the sheath removed. Final catheter positioning was confirmed and documented  with a fluoroscopic spot image. The catheter was aspirated, flushed with saline, and injected with appropriate volume heparin  dwells. The venotomy incision was closed with subcutaneous and subcuticular 4-0 Vicryl. Dermabond was applied to the incision. The catheter exit site was secured with 0-Prolene retention sutures. COMPLICATIONS: None.  No pneumothorax. FINDINGS: After catheter placement, the tip lies in the right atrium. The catheter aspirates normally and is ready for immediate use. IMPRESSION: Placement of tunneled hemodialysis catheter via the right internal jugular vein. The catheter tip lies in the right atrium. The catheter is ready for immediate use. Electronically Signed   By: Erica Hau M.D.   On: 09/01/2023 12:16   DG Humerus Left Result Date: 09/01/2023 CLINICAL DATA:  Fracture.  Status post ORIF. EXAM: LEFT HUMERUS - 2+ VIEW COMPARISON:  08/05/2023 FINDINGS: Similar markedly abnormal appearance to the distal humerus status post cortical plate and screw fixation. Study is limited by positioning and as before, bony anatomy is markedly demineralized with possible fragmentation of the distal humerus at the elbow in areas of substantial lucency. IMPRESSION: Limited assessment shows no substantial interval change in fixation hardware although direct comparison between the two studies is difficult due to differential positioning and markedly abnormal appearance of the distal humerus. As noted on the previous exam, given the marked loss of mineralized bone, infection is not excluded. Electronically Signed   By: Donnal Fusi M.D.   On: 09/01/2023 12:05         Scheduled Meds:  acyclovir   200 mg Per Tube BID   arformoterol   15 mcg Nebulization BID   collagenase    Topical Daily   feeding supplement (NEPRO CARB STEADY)  355 mL Per Tube TID PC & HS   feeding supplement (PROSource TF20)  60 mL Per Tube Daily   fiber  1 packet Per Tube BID   heparin  injection (subcutaneous)  5,000 Units  Subcutaneous Q8H   heparin  sodium (porcine)  10,000 Units Intravenous Once   insulin  aspart  0-15 Units Subcutaneous TID AC & HS   insulin  aspart  2 Units Subcutaneous TID AC & HS   insulin  glargine-yfgn  10 Units Subcutaneous BID   midodrine   10 mg Per Tube Q M,W,F-HD   montelukast   5 mg Per Tube QHS   multivitamin  1 tablet Per Tube QHS   nicotine   7 mg Transdermal Daily   nutrition supplement (JUVEN)  1 packet Per Tube BID BM   mouth rinse  15 mL Mouth Rinse 4 times per day   PARoxetine   40 mg Per Tube Daily   pneumococcal 20-valent conjugate vaccine  0.5 mL Intramuscular Tomorrow-1000   polyethylene glycol  17 g Per Tube BID   revefenacin   175 mcg Nebulization Daily   senna  1 tablet Per Tube Daily   sodium chloride  flush  10-40 mL Intracatheter Q12H   vancomycin  variable dose per unstable renal function (pharmacist  dosing)   Does not apply See admin instructions   voriconazole   350 mg Per Tube Q12H   Continuous Infusions:  sodium chloride  Stopped (08/28/23 1208)   sodium chloride  Stopped (08/28/23 0839)   valproate sodium  750 mg (09/01/23 2225)     LOS: 61 days    Time spent: 35 minutes    Sukhmani Fetherolf A Nickcole Bralley, MD [facility]   If 7PM-7AM, please contact night-coverage [email or web address]  09/02/2023, 7:11 AM

## 2023-09-02 NOTE — Progress Notes (Signed)
 Nutrition Follow-up  DOCUMENTATION CODES:  Severe malnutrition in context of acute illness/injury  INTERVENTION:  Continue bolus tube feeding via PEG: 6 cartons of Nepro 1.8 per day (1.5 cartons x 4 feeds per day) Will ensure there is a 2 hour break for voriconazole  administration BID (0900-1100, 2100-2300) Prosource TF20 60mL 1x/d Flush with 30mL before and after each blus feed (60mL 4x/d) This will provide 2600 kcal, 134g of protein, and of free water  ( TF+flush) Continue Juven 1 packet BID via tube, each packet provides 95 calories, 2.5 grams of protein (collagen) + micronutrients to support wound healing Continue Renal MVI daily via tube   NUTRITION DIAGNOSIS:  Severe Malnutrition related to acute illness (prolonged illness, interuptions in feeding) as evidenced by moderate fat depletion, moderate muscle depletion, percent weight loss (9.6% x 1 month). - remains applicable  GOAL:  Patient will meet greater than or equal to 90% of their needs - met with TF  MONITOR:  TF tolerance, I & O's, Labs, Weight trends  REASON FOR ASSESSMENT:  Consult Assessment of nutrition requirement/status  ASSESSMENT:  58 y.o. male with PMH of obesity, asthma, OSA on CPAP, GERD, HTN, HLD who presented due to feeling weak with AMS for a few days. Admitted with concern for sepsis, community-acquired pneumonia, encephalopathy as well as concern for multiple myeloma.  4/17 Admit to [facility] 4/21 SLP eval - NPO; IR attempted Cortrak but unable to be placed; CCM MD successfully placed NGT 4/22 Concern for aspiration overnight; Intubated 4/23 - CRRT 5/8 - tracheostomy 5/10 - CRRT discontinued 5/11 - transferred to [facility] 5/12 - iHD initiated  5/29 - PEG placed 5/31 - chemotherapy changed to Sarclisa /Kyprolis /Cytoxan  6/9 - MBS, NPO recommended  6/11 - trach removed 6/13 - BSE, NPO, tunneled HD catheter removed for line holiday 6/16 - Tunneled HD catheter replaced  Pt resting  in bed at the time of assessment. Continues on TF regimen, appears to be tolerating. Had line holiday but tunneled line replaced yesterday. Nephrology considering holding HD to monitor labs for signs of renal recovery.   Requested new weight today. Weight considerably different over the last 2 weeks. PT to attempt to help pt stand today so that bed can be zeroed and pt re-weighed. Will monitor.   Admit weight: 110.6 kg (4/17) Current weight: 122 kg (6/17) - ? accuracy  Intake/Output Summary (Last 24 hours) at 09/02/2023 1526 Last data filed at 09/01/2023 2222 Gross per 24 hour  Intake 3 ml  Output 2000 ml  Net -1997 ml  Net IO Since Admission: -7,717.56 mL [09/02/23 1526]  Drains/Lines: Tunneled HD catheter, right IJ, double lumen PEG 20 Fr. LUQ  Nutrition Related Medications:  Scheduled Meds:  NEPRO CARB STEADY  355 mL Per Tube TID PC & HS   PROSource TF20  60 mL Per Tube Daily   insulin  aspart  0-15 Units Subcutaneous TID AC & HS   insulin  aspart  2 Units Subcutaneous TID AC & HS   insulin  glargine-yfgn  10 Units Subcutaneous BID   multivitamin  1 tablet Per Tube QHS   JUVEN  1 packet Per Tube BID BM   polyethylene glycol  17 g Per Tube BID   senna  1 tablet Per Tube Daily   vancomycin     Does not apply See admin instructions   voriconazole   350 mg Per Tube Q12H   Continuous Infusions:  vancomycin      PRN Meds: ondansetron   Labs Reviewed: Sodium 133, chloride 96 Potassium 3.4  BUN 71, creatinine 1.25 CBG ranges from 99-186 mg/dL over the last 24 hours HgbA1c 6.0%  Micronutrient Labs: CRP 1.5  Vitamin A  42.5 (WNL) Zinc  73 (WNL) Folate 17.8 (WNL)  Diet Order:   Diet Order             Diet NPO time specified  Diet effective midnight                  EDUCATION NEEDS:  Not appropriate for education at this time  Skin: Skin Integrity Issues Per WOC note 6/9 Unstageable  - sacrum 5 cm x 4 cm  Stage 3:  - R Buttocks 6 cm x 4cm  Stage 2: - L buttocks 1  cm x 1 cm   Last BM:  6/16  Height:  Ht Readings from Last 1 Encounters:  07/22/23 6' 0.01 (1.829 m)   Weight:  Wt Readings from Last 1 Encounters:  09/02/23 122 kg   Ideal Body Weight:  80.9 kg  BMI:  Body mass index is 36.47 kg/m.  Estimated Nutritional Needs:  Kcal:  2400-2700 kcal/d Protein:  130-150g/d Fluid:  1L+UOP   Edwena Graham, RD, LDN Registered Dietitian II Please reach out via secure chat

## 2023-09-02 NOTE — Progress Notes (Signed)
 Speech Language Pathology Treatment: Dysphagia  Patient Details Name: Frank [name]. MRN: [id number] DOB: 13-Mar-1966 Today's Date: 09/02/2023 Time: 0910-0920 SLP Time Calculation (min) (ACUTE ONLY): 10 min  Assessment / Plan / Recommendation Clinical Impression  Pt more alert and attentive than any prior session. Still needs cues to sustain attention to task and initiate. Pt says he needs to rest and does appear more fatigued by end of session. However, he is able to assist in brushing teeth with cues for sequencing, also able to grasp a cup, cough and throat clear on command. Will repeat instrumental assessment to determine readiness for increased PO trials from ice, or a diet. Will attempt FEES to decrease fatigue from transfer.   HPI HPI: 58 y.o. male transferred from [facility] 07/03/23 to [facility] for management of newly diagnosed plasma cell myeloma with weakness and AMS. Pt also with AKI and metabolic encephalopathy. 5/10 transfer to [facility] for iHD.Bedside swallow eval 4/18 wiht recs for NPO due to somnolence. 4/22 Intubated and chemo initiated. 4/23-5/10 CRRT. 5/6 IVIG started. 5/8 trach. 5/12 iHD initiated. 5/18 inability to oxygenate through trach, intubated and trach revision. PMHx:Lt THA, carpal tunnel syndrome, Lt TKA, Lt humerus fx, PAF, HTN, HLD, bipolar disorder, obesity, T2DM      SLP Plan  Continue with current plan of care          Recommendations  Diet recommendations: NPO;Other(comment) (ice ok with supervision) Medication Administration: Via alternative means                              Continue with current plan of care     Janelie Goltz, Hardin Leys  09/02/2023, 10:45 AM

## 2023-09-03 ENCOUNTER — Other Ambulatory Visit: Payer: Self-pay

## 2023-09-03 DIAGNOSIS — C9000 Multiple myeloma not having achieved remission: Secondary | ICD-10-CM | POA: Diagnosis not present

## 2023-09-03 LAB — CBC WITH DIFFERENTIAL/PLATELET
Abs Immature Granulocytes: 0 10*3/uL (ref 0.00–0.07)
Basophils Absolute: 0 10*3/uL (ref 0.0–0.1)
Basophils Relative: 0 %
Eosinophils Absolute: 0 10*3/uL (ref 0.0–0.5)
Eosinophils Relative: 0 %
HCT: 26.1 % — ABNORMAL LOW (ref 39.0–52.0)
Hemoglobin: 8.6 g/dL — ABNORMAL LOW (ref 13.0–17.0)
Lymphocytes Relative: 34 %
Lymphs Abs: 0.6 10*3/uL — ABNORMAL LOW (ref 0.7–4.0)
MCH: 33.9 pg (ref 26.0–34.0)
MCHC: 33 g/dL (ref 30.0–36.0)
MCV: 102.8 fL — ABNORMAL HIGH (ref 80.0–100.0)
Monocytes Absolute: 0.3 10*3/uL (ref 0.1–1.0)
Monocytes Relative: 20 %
Neutro Abs: 0.8 10*3/uL — ABNORMAL LOW (ref 1.7–7.7)
Neutrophils Relative %: 46 %
Platelets: 99 10*3/uL — ABNORMAL LOW (ref 150–400)
RBC: 2.54 MIL/uL — ABNORMAL LOW (ref 4.22–5.81)
RDW: 22.2 % — ABNORMAL HIGH (ref 11.5–15.5)
Smear Review: NORMAL
WBC: 1.7 10*3/uL — ABNORMAL LOW (ref 4.0–10.5)
nRBC: 1.8 % — ABNORMAL HIGH (ref 0.0–0.2)

## 2023-09-03 LAB — COMPREHENSIVE METABOLIC PANEL WITH GFR
ALT: 8 U/L (ref 0–44)
AST: 13 U/L — ABNORMAL LOW (ref 15–41)
Albumin: 2.2 g/dL — ABNORMAL LOW (ref 3.5–5.0)
Alkaline Phosphatase: 68 U/L (ref 38–126)
Anion gap: 13 (ref 5–15)
BUN: 97 mg/dL — ABNORMAL HIGH (ref 6–20)
CO2: 25 mmol/L (ref 22–32)
Calcium: 10.4 mg/dL — ABNORMAL HIGH (ref 8.9–10.3)
Chloride: 96 mmol/L — ABNORMAL LOW (ref 98–111)
Creatinine, Ser: 1.58 mg/dL — ABNORMAL HIGH (ref 0.61–1.24)
GFR, Estimated: 50 mL/min — ABNORMAL LOW (ref >60)
Glucose, Bld: 103 mg/dL — ABNORMAL HIGH (ref 70–99)
Potassium: 3.7 mmol/L (ref 3.5–5.1)
Sodium: 134 mmol/L — ABNORMAL LOW (ref 135–145)
Total Bilirubin: 0.7 mg/dL (ref 0.0–1.2)
Total Protein: 5.9 g/dL — ABNORMAL LOW (ref 6.5–8.1)

## 2023-09-03 LAB — PROTEIN ELECTROPHORESIS, SERUM
A/G Ratio: 0.7 (ref 0.7–1.7)
Albumin ELP: 2.4 g/dL — ABNORMAL LOW (ref 2.9–4.4)
Alpha-1-Globulin: 0.2 g/dL (ref 0.0–0.4)
Alpha-2-Globulin: 0.8 g/dL (ref 0.4–1.0)
Beta Globulin: 0.8 g/dL (ref 0.7–1.3)
Gamma Globulin: 1.5 g/dL (ref 0.4–1.8)
Globulin, Total: 3.3 g/dL (ref 2.2–3.9)
M-Spike, %: 1.2 g/dL — ABNORMAL HIGH
Total Protein ELP: 5.7 g/dL — ABNORMAL LOW (ref 6.0–8.5)

## 2023-09-03 LAB — GLUCOSE, CAPILLARY
Glucose-Capillary: 121 mg/dL — ABNORMAL HIGH (ref 70–99)
Glucose-Capillary: 110 mg/dL — ABNORMAL HIGH (ref 70–99)
Glucose-Capillary: 205 mg/dL — ABNORMAL HIGH (ref 70–99)
Glucose-Capillary: 179 mg/dL — ABNORMAL HIGH (ref 70–99)
Glucose-Capillary: 158 mg/dL — ABNORMAL HIGH (ref 70–99)

## 2023-09-03 LAB — LACTATE DEHYDROGENASE: LDH: 175 U/L (ref 98–192)

## 2023-09-03 MED ORDER — HYDROXYZINE HCL 10 MG PO TABS
5.0000 mg | ORAL_TABLET | Freq: Three times a day (TID) | ORAL | Status: DC | PRN
  Administered 2023-09-10 – 2023-09-17 (×7): 5 mg via ORAL
  Filled 2023-09-03 (×7): qty 1

## 2023-09-03 MED ORDER — FILGRASTIM 480 MCG/1.6ML IJ SOLN
480.0000 ug | Freq: Every day | INTRAMUSCULAR | Status: DC
  Administered 2023-09-03 – 2023-09-05 (×2): 480 ug via SUBCUTANEOUS
  Filled 2023-09-03 (×4): qty 1.6

## 2023-09-03 MED ORDER — VANCOMYCIN VARIABLE DOSE PER UNSTABLE RENAL FUNCTION (PHARMACIST DOSING): Status: DC

## 2023-09-03 NOTE — Progress Notes (Signed)
 Patient ID: Frank Batman., male   DOB: 09-12-1965, 58 y.o.   MRN: [id number] [facility] Progress Note   Assessment/ Plan:    1. Acute kidney Injury on CKD3a: secondary to myeloma kidney. Transitioned to IHD on MWF schedule after previously being on CRRT; dialysis has been a little challenging in the face of hypotension.  Had extra HD on 6/10 to optimize clearance -------------------------------- - MWF HD.  Note predialysis administration of midodrine .  - Urine output has improved and showing signs of renal recovery.  Hold dialysis today.  May end up needing more dialysis because of elevated BUN.  Not suitable for outpatient dialysis placement yet (inability to sit in recliner).  [name] was decannulated, though, so making strides.  He may recover and be able to come off dialysis - TDC replaced after line holiday on 6/16.  Appreciate help from interventional radiology   2. Acute metabolic encephalopathy: appears to wax and wane per charting  - optimize HD as above   3. Multiple myeloma:  - Closely followed by Dr. Maria Shiner and started on aggressive regimen with Sarclisa , Kyprolis  and Cytoxan  - Appreciate oncology input  4. 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  Note plans to treat through 10/21/23 per ID note  5. Acute hypoxic respiratory failure - with trach removed on 6/11 - pulm following  6. Anemia: from acute illness and multiple myeloma. ESA per oncology   - CBC is ordered per team  7. Bacteremia - noted staph epidermidis from 6/7 blood cultures.   - Repeat blood cultures on 6/9 are NGTD  - Status sling holiday - abx per primary team and ID   8. Hypercalcemia: Corrected calcium  fairly high around 11.8.  Likely related to immobility.  Myeloma may contribute.  Consider bisphosphonate  Disposition - continue inpatient monitoring     Subjective:   Minimally interactive today.  Unable to answer questions.  Creatinine only  slightly increased but BUN fairly elevated.     Objective:   BP (!) 113/90 (BP Location: Right Arm)   Pulse (!) 107   Temp 99.2 F (37.3 C) (Oral)   Resp 20   Ht 6' 0.01 (1.829 m)   Wt 127 kg   SpO2 95%   BMI 37.96 kg/m   Intake/Output Summary (Last 24 hours) at 09/03/2023 9562 Last data filed at 09/03/2023 0500 Gross per 24 hour  Intake --  Output 1600 ml  Net -1600 ml   Weight change: 0.3 kg  Physical Exam:       General adult male in bed in no acute distress HEENT normocephalic atraumatic no nasal discharge Lungs bilateral chest rise with no increased work of breathing Heart tachycardic, no rub Abdomen soft nontender nondistended Extremities no edema, warm and well-perfused Neuro, lying in bed, minimally interactive   Imaging: DG Humerus Left Result Date: 09/01/2023 CLINICAL DATA:  Fracture.  Status post ORIF. EXAM: LEFT HUMERUS - 2+ VIEW COMPARISON:  08/05/2023 FINDINGS: Similar markedly abnormal appearance to the distal humerus status post cortical plate and screw fixation. Study is limited by positioning and as before, bony anatomy is markedly demineralized with possible fragmentation of the distal humerus at the elbow in areas of substantial lucency. IMPRESSION: Limited assessment shows no substantial interval change in fixation hardware although direct comparison between the two studies is difficult due to differential positioning and markedly abnormal appearance of the distal humerus. As noted on the previous exam, given the marked loss of mineralized bone, infection  is not excluded. Electronically Signed   By: Donnal Fusi M.D.   On: 09/01/2023 12:05      Labs: BMET Recent Labs  Lab 08/28/23 0515 08/29/23 0752 08/30/23 1101 08/31/23 1122 09/01/23 0449 09/01/23 0450 09/02/23 0420 09/03/23 0447  NA 131* 128* 128* 127* 127* 128* 133*  132* 134*  K 3.5 3.7 3.3* 3.4* 3.5 3.7 3.4*  3.4* 3.7  CL 94* 92* 92* 90* 91* 92* 96*  96* 96*  CO2 26 23 24 24 26 25  26  25 25   GLUCOSE 86 115* 127* 159* 113* 114* 111*  113* 103*  BUN 60* 100* 88* 117* 133* 133* 71*  72* 97*  CREATININE 1.67* 2.54* 2.01* 1.97* 1.89* 1.88* 1.25*  1.25* 1.58*  CALCIUM  8.5* 8.3* 8.4* 9.2 10.3 10.2 9.7  9.5 10.4*  PHOS 3.2 3.1 3.1 3.1  --  3.9 2.9  --    CBC Recent Labs  Lab 08/31/23 1122 09/01/23 0449 09/02/23 0420 09/03/23 0447  WBC 3.1* 2.9* 2.5* 1.7*  NEUTROABS 2.6 2.3 1.8 PENDING  HGB 8.6* 8.6* 8.9* 8.6*  HCT 25.7* 25.6* 27.0* 26.1*  MCV 101.2* 100.8* 102.3* 102.8*  PLT 85* 89* 91* 99*    Medications:     acyclovir   200 mg Per Tube BID   arformoterol   15 mcg Nebulization BID   collagenase    Topical Daily   feeding supplement (NEPRO CARB STEADY)  355 mL Per Tube TID PC & HS   feeding supplement (PROSource TF20)  60 mL Per Tube Daily   heparin  injection (subcutaneous)  5,000 Units Subcutaneous Q8H   heparin  sodium (porcine)  10,000 Units Intravenous Once   insulin  aspart  0-15 Units Subcutaneous TID AC & HS   insulin  aspart  2 Units Subcutaneous TID AC & HS   insulin  glargine-yfgn  10 Units Subcutaneous BID   midodrine   10 mg Per Tube Q M,W,F-HD   montelukast   5 mg Per Tube QHS   multivitamin  1 tablet Per Tube QHS   nicotine   7 mg Transdermal Daily   nutrition supplement (JUVEN)  1 packet Per Tube BID BM   mouth rinse  15 mL Mouth Rinse 4 times per day   PARoxetine   40 mg Per Tube Daily   pneumococcal 20-valent conjugate vaccine  0.5 mL Intramuscular Tomorrow-1000   polyethylene glycol  17 g Per Tube BID   revefenacin   175 mcg Nebulization Daily   senna  1 tablet Per Tube Daily   sodium chloride  flush  10-40 mL Intracatheter Q12H   voriconazole   350 mg Per Tube Q12H    Levorn Reason, MD 09/03/2023, 9:29 AM

## 2023-09-03 NOTE — Procedures (Signed)
 Objective Swallowing Evaluation: Type of Study: FEES-Fiberoptic Endoscopic Evaluation of Swallow   Patient Details  Name: Frank [name]. MRN: [id number] Date of Birth: 10-May-1965  Today's Date: 09/03/2023 Time: SLP Start Time (ACUTE ONLY): 1000 -SLP Stop Time (ACUTE ONLY): 1040  SLP Time Calculation (min) (ACUTE ONLY): 40 min   Past Medical History:  Past Medical History:  Diagnosis Date   Anxiety    Arthritis    Asthma    Bipolar disorder (HCC)    Current every day smoker    Depression    Diabetes mellitus, type II (HCC)    Dyspnea    History of kidney stones    History of pneumonia    Hyperlipidemia    Hypertension    Morbid obesity (HCC)    Sedentary lifestyle    Seizures (HCC)    last seizure 12 yrs ago, no current problem   Sleep apnea    uses CPAP nightly   Past Surgical History:  Past Surgical History:  Procedure Laterality Date   CARDIAC CATHETERIZATION  2020   carpal tunel Left    COLONOSCOPY     HARDWARE REMOVAL Left 07/26/2022   Procedure: HARDWARE REMOVAL ELBOW;  Surgeon: Laneta Pintos, MD;  Location: [facility] OR;  Service: Orthopedics;  Laterality: Left;   IR FLUORO GUIDE CV LINE RIGHT  08/07/2023   IR FLUORO GUIDE CV LINE RIGHT  09/01/2023   IR GASTROSTOMY TUBE MOD SED  08/14/2023   IR REMOVAL TUN CV CATH W/O FL  08/29/2023   IR US  GUIDE VASC ACCESS RIGHT  08/07/2023   IR US  GUIDE VASC ACCESS RIGHT  09/01/2023   ORIF HUMERUS FRACTURE Left 01/16/2022   Procedure: OPEN REDUCTION INTERNAL FIXATION (ORIF) DISTAL HUMERUS FRACTURE;  Surgeon: Laneta Pintos, MD;  Location: [facility] OR;  Service: Orthopedics;  Laterality: Left;   ORIF HUMERUS FRACTURE Left 01/29/2023   Procedure: OPEN REDUCTION INTERNAL FIXATION (ORIF) DISTAL HUMERUS FRACTURE;  Surgeon: Laneta Pintos, MD;  Location: [facility] OR;  Service: Orthopedics;  Laterality: Left;   OTHER SURGICAL HISTORY     R & L shoulder   OTHER SURGICAL HISTORY Right    Knee surgery x several   TOTAL HIP ARTHROPLASTY Left  12/26/2017   Procedure: LEFT TOTAL HIP ARTHROPLASTY ANTERIOR APPROACH;  Surgeon: Arnie Lao, MD;  Location: [facility];  Service: Orthopedics;  Laterality: Left;   TOTAL KNEE ARTHROPLASTY Left 11/23/2021   Procedure: LEFT TOTAL KNEE ARTHROPLASTY;  Surgeon: Arnie Lao, MD;  Location: [facility];  Service: Orthopedics;  Laterality: Left;   HPI: 58 y.o. male transferred from [facility] 07/03/23 to [facility] for management of newly diagnosed plasma cell myeloma with weakness and AMS. Pt also with AKI and metabolic encephalopathy. 5/10 transfer to [facility] for iHD.Bedside swallow eval 4/18 wiht recs for NPO due to somnolence. 4/22 Intubated and chemo initiated. 4/23-5/10 CRRT. 5/6 IVIG started. 5/8 trach. 5/12 iHD initiated. 5/18 inability to oxygenate through trach, intubated and trach revision. PMHx:Lt THA, carpal tunnel syndrome, Lt TKA, Lt humerus fx, PAF, HTN, HLD, bipolar disorder, obesity, T2DM   Subjective: somnolent    Assessment / Plan / Recommendation     09/03/2023   10:00 AM  Clinical Impressions  Clinical Impression Pt demonstrates little improvement in swallowing after a week of ice chips. There are dried secretions in pharynx: in the valleculae, pharyngeal wall, etc. Teaspoons of honey thick or nectar thick liquids, ice or puree did not clear secretions. Chin tuck and effortful swallow also  ineffective. All thickened liquids and solids were penetrated during the swallow without initiative from pt to clear, there was also moderate to severe vallecular residue. Only with unrestricted sips of thin liquids did pt have an effective cough and cleared airway and vestibule. Also finally cleared secretions. Though sensed aspiration occurred with thins, pts hard effective cough protected airway and posed the least risk of residue and aspiration post swallow. Pt motivated to drink thin liquids; expect some improvement in pharyngeal sensation and timing of swallow response as pharyngeal  hydration improves. Will initaite clear liquids with supervision to advance pt this week and retest for improvement. Also again observed round excresence on left lateral pharyngeal wall, as seen on MBS image.   SLP Visit Diagnosis Dysphagia, oropharyngeal phase (R13.12)  Attention and concentration deficit following --  Frontal lobe and executive function deficit following --  Impact on safety and function Moderate aspiration risk         09/03/2023   10:00 AM  Treatment Recommendations  Treatment Recommendations Therapy as outlined in treatment plan below        09/03/2023   10:00 AM  Prognosis  Prognosis for improved oropharyngeal function Good  Barriers to Reach Goals Cognitive deficits  Barriers/Prognosis Comment --       09/03/2023   10:00 AM  Diet Recommendations  SLP Diet Recommendations Thin liquid  Liquid Administration via Straw;Cup  Medication Administration Via alternative means  Compensations Slow rate;Hard cough after swallow  Postural Changes --         09/03/2023   10:00 AM  Other Recommendations  Recommended Consults --  Oral Care Recommendations Oral care QID  Caregiver Recommendations --  Follow Up Recommendations Skilled nursing-short term rehab (<3 hours/day)  Assistance recommended at discharge --  Functional Status Assessment Patient has had a recent decline in their functional status and demonstrates the ability to make significant improvements in function in a reasonable and predictable amount of time.       09/03/2023   10:00 AM  Frequency and Duration   Speech Therapy Frequency (ACUTE ONLY) min 2x/week  Treatment Duration 2 weeks         09/03/2023   10:00 AM  Oral Phase  Oral Phase WFL  Oral - Pudding Teaspoon --  Oral - Pudding Cup --  Oral - Honey Teaspoon --  Oral - Honey Cup --  Oral - Nectar Teaspoon --  Oral - Nectar Cup --  Oral - Nectar Straw --  Oral - Thin Teaspoon --  Oral - Thin Cup --  Oral - Thin Straw --  Oral -  Puree --  Oral - Mech Soft --  Oral - Regular --  Oral - Multi-Consistency --  Oral - Pill --  Oral Phase - Comment --       09/03/2023   10:00 AM  Pharyngeal Phase  Pharyngeal Phase Impaired  Pharyngeal- Pudding Teaspoon --  Pharyngeal --  Pharyngeal- Pudding Cup --  Pharyngeal --  Pharyngeal- Honey Teaspoon Pharyngeal residue - valleculae;Penetration/Aspiration during swallow;Penetration/Aspiration before swallow;Reduced tongue base retraction  Pharyngeal Material enters airway, CONTACTS cords and not ejected out  Pharyngeal- Honey Cup NT  Pharyngeal --  Pharyngeal- Nectar Teaspoon Penetration/Aspiration during swallow;Delayed swallow initiation-pyriform sinuses;Pharyngeal residue - valleculae;Trace aspiration  Pharyngeal Material enters airway, CONTACTS cords and not ejected out;Material enters airway, passes BELOW cords without attempt by patient to eject out (silent aspiration)  Pharyngeal- Nectar Cup Penetration/Aspiration during swallow;Delayed swallow initiation-pyriform sinuses;Pharyngeal residue - valleculae;Trace aspiration  Pharyngeal  Material enters airway, CONTACTS cords and not ejected out  Pharyngeal- Nectar Straw --  Pharyngeal --  Pharyngeal- Thin Teaspoon Delayed swallow initiation-pyriform sinuses;Penetration/Aspiration during swallow  Pharyngeal --  Pharyngeal- Thin Cup Penetration/Aspiration during swallow;Penetration/Aspiration before swallow;Delayed swallow initiation-pyriform sinuses;Trace aspiration  Pharyngeal --  Pharyngeal- Thin Straw Delayed swallow initiation-pyriform sinuses;Penetration/Aspiration before swallow;Penetration/Aspiration during swallow;Trace aspiration  Pharyngeal --  Pharyngeal- Puree Reduced tongue base retraction;Pharyngeal residue - valleculae  Pharyngeal Material enters airway, CONTACTS cords and then ejected out  Pharyngeal- Mechanical Soft NT  Pharyngeal --  Pharyngeal- Regular NT  Pharyngeal --  Pharyngeal- Multi-consistency  --  Pharyngeal --  Pharyngeal- Pill --  Pharyngeal --  Pharyngeal Comment --         No data to display           Jaleeah Slight, Hardin Leys 09/03/2023, 12:19 PM

## 2023-09-03 NOTE — Progress Notes (Addendum)
 Dr called back and does not wish to order meds at this time but decided to order the prn atarax  if he absolutely needs something.  Spoke with pt and explained, as he does sleep most of the day and meds will make him sleepier.  He did agree to try a fan in the room, as he said the room is small and feels like it needs more room and circulation.  When I went to his room to address the concerns of the wife, he was asleep and I had to tap him a few times and call out his name to get him to even wake up to discuss this.

## 2023-09-03 NOTE — Progress Notes (Signed)
 PROGRESS NOTE  Frank [name]. OZH:[id number] DOB: 06/19/1965 DOA: 07/03/2023 PCP: Serita Danes, MD   LOS: 62 days   Brief Narrative / Interim history: 58 year old PMH OSA CPAP, pathological fracture and multiple ORIF left Humerus likely due to multiple myeloma, A-fib not on anticoagulation, hyperlipidemia, tobacco use disorder who was  feeling weak for 1 week, admitted  to [facility] with concern of sepsis, community-acquired pneumonia and acute encephalopathy.  Patient was found to have lytic lesions,  he was transferred to [facility] for  oncology treatment.  In the meantime he developed encephalopathy, acute renal failure.  He remained in the ICU for 36 days.  Underwent tracheostomy. He was also started on HD after  CRRT.  Subsequently PEG tube placed 5/29.   Significant events 4/17-Admitted to medical floor with oncology consultation to treat multiple myeloma. 4/19-transferred to ICU intubated, encephalopathic. 4/20-A-fib with RVR. Started heparin . Groin and biopsy site hematoma requiring multiple transfusions. EEG 4/25 >> moderate to severe encephalopathy without any seizures or epileptiform discharges. 4/29 Received Velcade  and Cytoxan  chemotherapy-intent palliative? 5/2 LP and ETT exchange  5/8 Trach placed. Some post op bleeding improved with thrombi-pad 5/10 Voriconazole  added back based on CT Chest findings 5/13 more alert and able to follow some commands. 5/23, transferred to floor. 5/29-PEG placed 5/31 chemotherapy changed to Sarclisa /Kyprolis /Cytoxan . 6/13 plan to remove HD cathter for holiday line 6/16 new HD catheter placed  Subjective / 24h Interval events: Alert, opens his eyes, no significant complaints, very flat affect and does not engage much in the conversation  Assesement and Plan: Principal Problem:   Multiple myeloma not having achieved remission (HCC) Active Problems:   AKI (acute kidney injury) (HCC)   Acute hypoxemic respiratory failure (HCC)    Goals of care, counseling/discussion   Acute metabolic encephalopathy   Lobar pneumonia, unspecified organism (HCC)   Fever   Aspergillosis, unspecified (HCC)   Hematoma of right thigh   Generalized weakness   Atrial fibrillation with rapid ventricular response (HCC)   PAF (paroxysmal atrial fibrillation) (HCC)   On mechanically assisted ventilation (HCC)   Multiple myeloma without remission (HCC)   Protein-calorie malnutrition, severe   Malfunction of tracheostomy stoma (HCC)   Seizure disorder (HCC)   DMII (diabetes mellitus, type 2) (HCC)   Principal problem Recurrent fevers, sepsis, Staph Epi Bacteremia -Hospital course complicated by intermittent fevers, ID consulted and most recently evaluated patient 6/17.  He had a fever and MRSE bacteremia 1/4 bottles, currently on vancomycin  with end date 6/20.  He received a line holiday - Repeat blood culture 6/9 without growth -His unstageable sacral ulcer, continue local wound care.  He is afebrile  Active problems  Aspergillosis with pneumonia -On Voriconazole  per ID, On Acyclovir .  End date for voriconazole  is 8/5   Multiple myeloma - He was  started on Velcade  and Cytoxan .  Chemotherapy changed to Sarclisa /Kyprolis /Cytoxan  on 5/31. Dr Maria Shiner following. Getting Chemo, 3 weeks on and 1 week off.  Next chemo treatment 6/19   Goals of care - Unfortunately poor long-term prognosis. Palliative care has discussed with wife previously.  Remain full code on full scope of care   Acute Hypoxic Respiratory Failure - Pulmonology following intermittently, trach removed 6/11.  Respiratory status is stable, currently on room air   Right Thigh Hematoma - per CT on 08/01/23: 12.1 x 30.2 x 7.6 cm subcutaneous hematoma within the anterior right thigh. 8.4 x 10.4 x 22.2 cm subcutaneous hematoma within the right gluteal region - continue  supportive care.  Hemoglobin 8.6   Severe acute metabolic encephalopathy with hypoactive delirium, seizure disorder -  Normal MRI of the brain.  LP negative for meningitis or encephalitis.  EEG negative for seizures but encephalopathy. Home Depakote  discontinued in ICU.   Insulin -dependent diabetes type 2 with hyperglycemia- A1c 6.0, continue with sliding scale insulin    AKI on CKD stage IIIa, Uremia,  Hyponatremia, Hypokalemia -Due to MM, hypercalcemia and ACE inhibitors?  Nephrology consulted, now requiring HD.  Needs to be able to sit in the recliner to qualify for outpatient HD   Paroxysmal A-fib - Unable to  anticoagulation due to hematoma. Rate controlled.    Obstructive Sleep Apnea -Trach removed   Anxiety/Bipolar disorder - Continue with  Paxil  and Depakote    Severe Malnutrition - Continue tube feeding.   History of Pathological fracture and multiple ORIF left Humerus 01/2023   Hypokalemia - correction with hd   Hyponatremia - Correction with HD.    Scheduled Meds:  acyclovir   200 mg Per Tube BID   arformoterol   15 mcg Nebulization BID   collagenase    Topical Daily   feeding supplement (NEPRO CARB STEADY)  355 mL Per Tube TID PC & HS   feeding supplement (PROSource TF20)  60 mL Per Tube Daily   heparin  injection (subcutaneous)  5,000 Units Subcutaneous Q8H   heparin  sodium (porcine)  10,000 Units Intravenous Once   insulin  aspart  0-15 Units Subcutaneous TID AC & HS   insulin  aspart  2 Units Subcutaneous TID AC & HS   insulin  glargine-yfgn  10 Units Subcutaneous BID   midodrine   10 mg Per Tube Q M,W,F-HD   montelukast   5 mg Per Tube QHS   multivitamin  1 tablet Per Tube QHS   nicotine   7 mg Transdermal Daily   nutrition supplement (JUVEN)  1 packet Per Tube BID BM   mouth rinse  15 mL Mouth Rinse 4 times per day   PARoxetine   40 mg Per Tube Daily   pneumococcal 20-valent conjugate vaccine  0.5 mL Intramuscular Tomorrow-1000   polyethylene glycol  17 g Per Tube BID   revefenacin   175 mcg Nebulization Daily   senna  1 tablet Per Tube Daily   sodium chloride  flush  10-40 mL  Intracatheter Q12H   voriconazole   350 mg Per Tube Q12H   Continuous Infusions:  sodium chloride  Stopped (08/28/23 1208)   sodium chloride  Stopped (08/28/23 0839)   valproate sodium  750 mg (09/02/23 2243)   PRN Meds:.acetaminophen  (TYLENOL ) oral liquid 160 mg/5 mL **OR** acetaminophen , hydrALAZINE , ipratropium-albuterol , ondansetron  (ZOFRAN ) IV, oxyCODONE , artificial tears, sodium chloride  flush  Current Outpatient Medications  Medication Instructions   albuterol  (PROVENTIL  HFA;VENTOLIN  HFA) 108 (90 Base) MCG/ACT inhaler 2 puffs, Inhalation, Every 6 hours PRN   albuterol  (PROVENTIL ) 2.5 mg, Every 2 hours PRN   aspirin  EC 81 mg, Oral, Daily, Swallow whole.   buPROPion (WELLBUTRIN XL) 150 mg, Daily   busPIRone  (BUSPAR ) 15 mg, Oral, Daily at bedtime   celecoxib  (CELEBREX ) 200 mg, Oral, Daily   clonazePAM  (KLONOPIN ) 1 mg, Oral, Once PRN   cyclobenzaprine  (FLEXERIL ) 5 mg, Oral, 3 times daily PRN   divalproex  (DEPAKOTE ) 500 mg, Oral, 2 times daily   EPINEPHrine  (EPI-PEN) 0.3 mg, Intramuscular, As needed   esomeprazole (NEXIUM) 40 mg, Oral, Every morning   Fluticasone -Salmeterol (ADVAIR) 500-50 MCG/DOSE AEPB 2 puffs, Inhalation, Every morning   furosemide  (LASIX ) 40 mg, Oral, Daily PRN   gabapentin  (NEURONTIN ) 300-600 mg, Oral, See admin instructions, 300 mg  in the morning, 600 mg at bedtime    hydrALAZINE  (APRESOLINE ) 10 mg, Oral, 2 times daily   hydrochlorothiazide  (HYDRODIURIL ) 25 mg, Oral, Daily   lisinopril  (ZESTRIL ) 40 mg, Oral, Daily   metFORMIN  (GLUCOPHAGE ) 1,000 mg, Oral, 2 times daily with meals   montelukast  (SINGULAIR ) 10 mg, Oral, Daily at bedtime   OVER THE COUNTER MEDICATION 400 mg, Oral, Daily at bedtime, Burdock root    oxyCODONE  (ROXICODONE ) 5 mg, Oral, Every 4 hours PRN   PARoxetine  (PAXIL ) 40 mg, Oral, BH-each morning   spironolactone  (ALDACTONE ) 25 mg, Oral, Daily at bedtime   tamsulosin  (FLOMAX ) 0.4 mg, Oral, Daily at bedtime   testosterone  cypionate  (DEPOTESTOSTERONE CYPIONATE) 100 mg, Intramuscular, Every 14 days   verapamil  (CALAN -SR) 120 mg, Oral, Daily   Vitamin D  (Ergocalciferol ) (DRISDOL ) 50,000 Units, Oral, Every Thu    Diet Orders (From admission, onward)     Start     Ordered   09/01/23 0001  Diet NPO time specified  Diet effective midnight        08/30/23 1111            DVT prophylaxis: heparin  injection 5,000 Units Start: 08/14/23 2200 Place and maintain sequential compression device Start: 07/14/23 1033   Lab Results  Component Value Date   PLT 99 (L) 09/03/2023      Code Status: Full Code  Family Communication: no family at bedside   Status is: Inpatient Remains inpatient appropriate because: severity of illness  Level of care: Progressive  Consultants:  ID Nephrology Oncology  Objective: Vitals:   09/03/23 0412 09/03/23 0500 09/03/23 0750 09/03/23 1105  BP: (!) 124/91  (!) 113/90 (!) 129/94  Pulse: (!) 103  (!) 107 (!) 107  Resp: 16  20 18   Temp: 98.1 F (36.7 C)  99.2 F (37.3 C) 98.8 F (37.1 C)  TempSrc: Oral  Oral Oral  SpO2: 95%  95% 95%  Weight:  127 kg    Height:        Intake/Output Summary (Last 24 hours) at 09/03/2023 1113 Last data filed at 09/03/2023 0500 Gross per 24 hour  Intake --  Output 1600 ml  Net -1600 ml   Wt Readings from Last 3 Encounters:  09/03/23 127 kg  06/05/23 113.7 kg  01/29/23 117.9 kg    Examination:  Constitutional: NAD Eyes: no scleral icterus ENMT: Mucous membranes are moist.  Neck: normal, supple Respiratory: clear to auscultation bilaterally, no wheezing, no crackles. Normal respiratory effort.  Cardiovascular: Regular rate and rhythm, no murmurs / rubs / gallops. No LE edema.  Abdomen: non distended, no tenderness. Bowel sounds positive.  Musculoskeletal: no clubbing / cyanosis.   Data Reviewed: I have independently reviewed following labs and imaging studies   CBC Recent Labs  Lab 08/28/23 0515 08/31/23 1122 09/01/23 0449  09/02/23 0420 09/03/23 0447  WBC 3.3* 3.1* 2.9* 2.5* 1.7*  HGB 9.3* 8.6* 8.6* 8.9* 8.6*  HCT 28.8* 25.7* 25.6* 27.0* 26.1*  PLT 103* 85* 89* 91* 99*  MCV 103.6* 101.2* 100.8* 102.3* 102.8*  MCH 33.5 33.9 33.9 33.7 33.9  MCHC 32.3 33.5 33.6 33.0 33.0  RDW 22.5* 22.3* 22.4* 22.5* 22.2*  LYMPHSABS 0.5* 0.3* 0.4* 0.4* PENDING  MONOABS 0.1 0.2 0.3 0.3 PENDING  EOSABS 0.0 0.0 0.0 0.0 PENDING  BASOSABS 0.0 0.0 0.0 0.0 PENDING    Recent Labs  Lab 08/28/23 0515 08/29/23 0752 08/31/23 1122 09/01/23 0449 09/01/23 0450 09/02/23 0420 09/03/23 0447  NA 131*   < > 127*  127* 128* 133*  132* 134*  K 3.5   < > 3.4* 3.5 3.7 3.4*  3.4* 3.7  CL 94*   < > 90* 91* 92* 96*  96* 96*  CO2 26   < > 24 26 25 26  25 25   GLUCOSE 86   < > 159* 113* 114* 111*  113* 103*  BUN 60*   < > 117* 133* 133* 71*  72* 97*  CREATININE 1.67*   < > 1.97* 1.89* 1.88* 1.25*  1.25* 1.58*  CALCIUM  8.5*   < > 9.2 10.3 10.2 9.7  9.5 10.4*  AST 21  --  12* 11*  --  12* 13*  ALT 16  --  11 11  --  10 8  ALKPHOS 55  --  49 51  --  65 68  BILITOT 0.6  --  0.6 0.7  --  0.6 0.7  ALBUMIN  2.4*  2.4*   < > 2.1* 2.0* 2.0* 2.1*  2.1* 2.2*   < > = values in this interval not displayed.    ------------------------------------------------------------------------------------------------------------------ No results for input(s): CHOL, HDL, LDLCALC, TRIG, CHOLHDL, LDLDIRECT in the last 72 hours.  Lab Results  Component Value Date   HGBA1C 6.0 (H) 08/14/2023   ------------------------------------------------------------------------------------------------------------------ No results for input(s): TSH, T4TOTAL, T3FREE, THYROIDAB in the last 72 hours.  Invalid input(s): FREET3  Cardiac Enzymes No results for input(s): CKMB, TROPONINI, MYOGLOBIN in the last 168 hours.  Invalid input(s):  CK ------------------------------------------------------------------------------------------------------------------    Component Value Date/Time   BNP 301.2 (H) 07/23/2023 0816    CBG: Recent Labs  Lab 09/02/23 1545 09/02/23 1956 09/02/23 2143 09/03/23 0409 09/03/23 0728  GLUCAP 119* 119* 118* 121* 110*    Recent Results (from the past 240 hours)  Culture, blood (Routine X 2) w Reflex to ID Panel     Status: None   Collection Time: 08/25/23 11:09 AM   Specimen: BLOOD RIGHT ARM  Result Value Ref Range Status   Specimen Description BLOOD RIGHT ARM  Final   Special Requests   Final    BOTTLES DRAWN AEROBIC ONLY Blood Culture adequate volume   Culture   Final    NO GROWTH 5 DAYS Performed at [facility], [address], [place], [place] [address]    Report Status 08/30/2023 FINAL  Final  Culture, blood (Routine X 2) w Reflex to ID Panel     Status: None   Collection Time: 08/25/23 11:10 AM   Specimen: BLOOD LEFT HAND  Result Value Ref Range Status   Specimen Description BLOOD LEFT HAND  Final   Special Requests   Final    BOTTLES DRAWN AEROBIC ONLY Blood Culture results may not be optimal due to an inadequate volume of blood received in culture bottles   Culture   Final    NO GROWTH 5 DAYS Performed at [facility], [address] [place], [place] [address]    Report Status 08/30/2023 FINAL  Final     Radiology Studies: No results found.   Kathlen Para, MD, PhD [facility]  Between 7 am - 7 pm I am available, please contact me via Amion (for emergencies) or Securechat (non urgent messages)  Between 7 pm - 7 am I am not available, please contact night coverage MD/APP via Amion

## 2023-09-03 NOTE — Progress Notes (Signed)
 Wife called and said he is very anxious and needs something to help calm him.  Pt said he is very anxious and feels like the room is closing in on him. Paged Dr Aldona Amel to inform him.

## 2023-09-03 NOTE — Progress Notes (Incomplete)
Vori

## 2023-09-03 NOTE — Plan of Care (Signed)
 Pt had been calm though shouted a few times. Re informed pt to press the call bell whenever he needs something and he follows. Problem: Education: Goal: Knowledge of General Education information will improve Description: Including pain rating scale, medication(s)/side effects and non-pharmacologic comfort measures Outcome: Progressing   Problem: Health Behavior/Discharge Planning: Goal: Ability to manage health-related needs will improve Outcome: Progressing   Problem: Clinical Measurements: Goal: Will remain free from infection Outcome: Progressing   Problem: Activity: Goal: Risk for activity intolerance will decrease Outcome: Progressing   Problem: Coping: Goal: Level of anxiety will decrease Outcome: Progressing   Problem: Nutrition: Goal: Adequate nutrition will be maintained Outcome: Progressing   Problem: Safety: Goal: Ability to remain free from injury will improve Outcome: Progressing   Problem: Skin Integrity: Goal: Risk for impaired skin integrity will decrease Outcome: Progressing

## 2023-09-03 NOTE — TOC Progression Note (Signed)
 Transition of Care (TOC) - Progression Note    Patient Details  Name: Fardeen Steinberger. MRN: [id number] Date of Birth: Mar 31, 1965  Transition of Care [facility]) CM/SW Contact  Jonathan Neighbor, RN Phone Number: 09/03/2023, 3:24 PM  Clinical Narrative:     Pt is still receiving chemo. LTACHs wont accept with IV chemo. Pt also needs to be able to sit for HD for SnF rehab.  TOC following.  Expected Discharge Plan: Long Term Acute Care (LTAC) Barriers to Discharge: Continued Medical Work up  Expected Discharge Plan and Services In-house Referral: Clinical Social Work Discharge Planning Services: CM Consult Post Acute Care Choice: Skilled Nursing Facility Living arrangements for the past 2 months: Single Family Home                 DME Arranged: N/A DME Agency: NA       HH Arranged: NA HH Agency: NA         Social Determinants of Health (SDOH) Interventions SDOH Screenings   Food Insecurity: No Food Insecurity (07/05/2023)  Housing: Low Risk  (07/05/2023)  Transportation Needs: No Transportation Needs (07/05/2023)  Utilities: Not At Risk (07/05/2023)  Tobacco Use: High Risk (08/30/2023)    Readmission Risk Interventions     No data to display

## 2023-09-03 NOTE — Progress Notes (Signed)
 Speech Language Pathology Treatment: Dysphagia  Patient Details Name: Katsumi Wisler. MRN: [id number] DOB: 12/11/1965 Today's Date: 09/03/2023 Time: 8295-6213 SLP Time Calculation (min) (ACUTE ONLY): 8 min  Assessment / Plan / Recommendation Clinical Impression  Visited pt to determine readiness for FEES given waxing and waning arousal. Pt woke up easily but did not immediately respond. Accepted ice and cleared throat coughed and swallowed well. Able to participate in conversation. Will proceed with FEES shortly.   HPI        SLP Plan  Continue with current plan of care - FEES          Recommendations  Diet recommendations: NPO                              Continue with current plan of care     Aaliyana Fredericks, Hardin Leys  09/03/2023, 9:50 AM

## 2023-09-03 NOTE — Progress Notes (Signed)
 Occupational Therapy Treatment Patient Details Name: Frank [name]. MRN: [id number] DOB: [birth year] Today's Date: 09/03/2023   History of present illness 58 y.o. male transferred from [facility] 07/03/23 to [facility] for management of newly diagnosed plasma cell myeloma with weakness and AMS. Pt also with AKI and metabolic encephalopathy. 5/10 transfer to [facility] for iHD. 4/22 Intubated and chemo initiated. 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 5/6 IVIG started. 5/8-6/11 trach. 5/12 iHD initiated. 5/29 PEG placed. 6/5 chemo initiated. PMHx:Lt THA, carpal tunnel syndrome, Lt TKA, Lt humerus fx, PAF, HTN, HLD, bipolar disorder, obesity, T2DM   OT comments  Pt with similar presentation today compared to IE, had a period where he felt like he was going to pass out per his statements. Pt also noted to have syncopal episode with PT session yesterday, notified RN on incidence. We were able to perform bed mobility with Max A +2 and progress sitting balance to min A before onset of syncopal feeling. His vitals read as stable, may look to introduce bed level exercises if pt not able to tolerate EOB/OOB. OT to continue to progress pt as able. Patient will benefit from continued inpatient follow up therapy, <3 hours/day       If plan is discharge home, recommend the following:  A lot of help with walking and/or transfers;Two people to help with walking and/or transfers;A lot of help with bathing/dressing/bathroom;Two people to help with bathing/dressing/bathroom;Assistance with cooking/housework;Assistance with feeding;Direct supervision/assist for medications management;Direct supervision/assist for financial management;Assist for transportation;Help with stairs or ramp for entrance   Equipment Recommendations  Other (comment) (defer to next level of care)    Recommendations for Other Services      Precautions / Restrictions Precautions Precautions: Fall Recall of Precautions/Restrictions:  Impaired Precaution/Restrictions Comments: episode of syncope Restrictions Weight Bearing Restrictions Per Provider Order: No LUE Weight Bearing Per Provider Order: Non weight bearing Other Position/Activity Restrictions: Lt humerus non-union, splinted       Mobility Bed Mobility Overal bed mobility: Needs Assistance Bed Mobility: Rolling, Sit to Supine, Sidelying to Sit Rolling: Max assist Sidelying to sit: Max assist, +2 for physical assistance, +2 for safety/equipment   Sit to supine: Max assist, +2 for physical assistance, +2 for safety/equipment   General bed mobility comments: Rolling to R side    Transfers                   General transfer comment: deferred     Balance Overall balance assessment: Needs assistance Sitting-balance support: Single extremity supported, No upper extremity supported, Feet supported Sitting balance-Leahy Scale: Poor Sitting balance - Comments: Mostly min A before feeling unwell and needing max A to prevent anterior LOB. LUE propped on pillows, minA to push pt R to avoid WB on LUE. Postural control: Other (comment), Left lateral lean (anterior)                                 ADL either performed or assessed with clinical judgement   ADL                                         General ADL Comments: limited session, got pt to EOB. Performed some reaching to work on balance then pt began to feel unwell-became less responsive-leaning anteriorly more    Extremity/Trunk Assessment Upper Extremity Assessment  RUE Deficits / Details: Generalized weakness, pt using functionally during bed mobility, impaired grasp (loose) RUE Sensation: WNL LUE Deficits / Details: NWB, splinted. Limited hand ROM. poor grip strength. LUE Sensation: decreased light touch LUE Coordination: decreased fine motor;decreased gross motor            Vision       Perception     Praxis     Communication  Communication Communication: Impaired Factors Affecting Communication: Reduced clarity of speech   Cognition Arousal: Alert Behavior During Therapy: Flat affect             Executive functioning impairment (select all impairments): Problem solving OT - Cognition Comments: Follows simple one step commands. cognition needs further assessing.                 Following commands: Impaired Following commands impaired: Follows one step commands with increased time      Cueing   Cueing Techniques: Verbal cues  Exercises Other Exercises Other Exercises: dynamic reaching sitting EOB with RUE    Shoulder Instructions       General Comments HR 108 with activity, unable to get sitting BP. Supine bp 132/94    Pertinent Vitals/ Pain       Pain Assessment Pain Assessment: Faces Faces Pain Scale: Hurts even more Pain Location: Rt shoulder Pain Descriptors / Indicators: Grimacing, Aching, Discomfort, Guarding Pain Intervention(s): Monitored during session, Limited activity within patient's tolerance, Repositioned  Home Living                                          Prior Functioning/Environment              Frequency  Min 1X/week        Progress Toward Goals  OT Goals(current goals can now be found in the care plan section)  Progress towards OT goals: Not progressing toward goals - comment (no progress from IE)  Acute Rehab OT Goals Patient Stated Goal: less pain OT Goal Formulation: With patient Time For Goal Achievement: 09/12/23 Potential to Achieve Goals: Good ADL Goals Pt/caregiver will Perform Home Exercise Program: Right Upper extremity;Increased strength;With written HEP provided;Independently  Plan      Co-evaluation                 AM-PAC OT 6 Clicks Daily Activity     Outcome Measure   Help from another person eating meals?: A Lot Help from another person taking care of personal grooming?: A Lot Help from another  person toileting, which includes using toliet, bedpan, or urinal?: Total Help from another person bathing (including washing, rinsing, drying)?: A Lot Help from another person to put on and taking off regular upper body clothing?: A Lot Help from another person to put on and taking off regular lower body clothing?: A Lot 6 Click Score: 11    End of Session    OT Visit Diagnosis: Unsteadiness on feet (R26.81);Other abnormalities of gait and mobility (R26.89);Muscle weakness (generalized) (M62.81);Pain   Activity Tolerance Treatment limited secondary to medical complications (Comment)   Patient Left in bed;with call bell/phone within reach   Nurse Communication Mobility status        Time: 4098-1191 OT Time Calculation (min): 31 min  Charges: OT General Charges $OT Visit: 1 Visit OT Treatments $Therapeutic Activity: 23-37 mins  09/03/2023  AB, OTR/L  Acute Rehabilitation Services  Office: [phone]   Jorene New 09/03/2023, 4:02 PM

## 2023-09-03 NOTE — Plan of Care (Signed)
 Has a bedside swallow and passed with a clear liquid diet.  Eating well.  Still receiving bolus feeds through his tube.  In much better spirits today.    Problem: Clinical Measurements: Goal: Will remain free from infection Outcome: Progressing   Problem: Clinical Measurements: Goal: Diagnostic test results will improve Outcome: Progressing   Problem: Clinical Measurements: Goal: Respiratory complications will improve Outcome: Progressing   Problem: Clinical Measurements: Goal: Cardiovascular complication will be avoided Outcome: Progressing

## 2023-09-04 DIAGNOSIS — C9000 Multiple myeloma not having achieved remission: Secondary | ICD-10-CM | POA: Diagnosis not present

## 2023-09-04 LAB — CBC WITH DIFFERENTIAL/PLATELET
Abs Immature Granulocytes: 0.01 10*3/uL (ref 0.00–0.07)
Basophils Absolute: 0 10*3/uL (ref 0.0–0.1)
Basophils Relative: 0 %
Eosinophils Absolute: 0 10*3/uL (ref 0.0–0.5)
Eosinophils Relative: 0 %
HCT: 26.4 % — ABNORMAL LOW (ref 39.0–52.0)
Hemoglobin: 8.6 g/dL — ABNORMAL LOW (ref 13.0–17.0)
Immature Granulocytes: 0 %
Lymphocytes Relative: 18 %
Lymphs Abs: 0.5 10*3/uL — ABNORMAL LOW (ref 0.7–4.0)
MCH: 33.9 pg (ref 26.0–34.0)
MCHC: 32.6 g/dL (ref 30.0–36.0)
MCV: 103.9 fL — ABNORMAL HIGH (ref 80.0–100.0)
Monocytes Absolute: 0.5 10*3/uL (ref 0.1–1.0)
Monocytes Relative: 20 %
Neutro Abs: 1.7 10*3/uL (ref 1.7–7.7)
Neutrophils Relative %: 62 %
Platelets: 115 10*3/uL — ABNORMAL LOW (ref 150–400)
RBC: 2.54 MIL/uL — ABNORMAL LOW (ref 4.22–5.81)
RDW: 22.5 % — ABNORMAL HIGH (ref 11.5–15.5)
Smear Review: NORMAL
WBC: 2.7 10*3/uL — ABNORMAL LOW (ref 4.0–10.5)
nRBC: 1.1 % — ABNORMAL HIGH (ref 0.0–0.2)

## 2023-09-04 LAB — COMPREHENSIVE METABOLIC PANEL WITH GFR
ALT: 8 U/L (ref 0–44)
AST: 14 U/L — ABNORMAL LOW (ref 15–41)
Albumin: 2.1 g/dL — ABNORMAL LOW (ref 3.5–5.0)
Alkaline Phosphatase: 74 U/L (ref 38–126)
Anion gap: 11 (ref 5–15)
BUN: 108 mg/dL — ABNORMAL HIGH (ref 6–20)
CO2: 29 mmol/L (ref 22–32)
Calcium: 10.5 mg/dL — ABNORMAL HIGH (ref 8.9–10.3)
Chloride: 96 mmol/L — ABNORMAL LOW (ref 98–111)
Creatinine, Ser: 1.65 mg/dL — ABNORMAL HIGH (ref 0.61–1.24)
GFR, Estimated: 48 mL/min — ABNORMAL LOW (ref >60)
Glucose, Bld: 123 mg/dL — ABNORMAL HIGH (ref 70–99)
Potassium: 3.7 mmol/L (ref 3.5–5.1)
Sodium: 136 mmol/L (ref 135–145)
Total Bilirubin: 0.7 mg/dL (ref 0.0–1.2)
Total Protein: 5.9 g/dL — ABNORMAL LOW (ref 6.5–8.1)

## 2023-09-04 LAB — GLUCOSE, CAPILLARY
Glucose-Capillary: 106 mg/dL — ABNORMAL HIGH (ref 70–99)
Glucose-Capillary: 109 mg/dL — ABNORMAL HIGH (ref 70–99)
Glucose-Capillary: 121 mg/dL — ABNORMAL HIGH (ref 70–99)
Glucose-Capillary: 133 mg/dL — ABNORMAL HIGH (ref 70–99)
Glucose-Capillary: 146 mg/dL — ABNORMAL HIGH (ref 70–99)
Glucose-Capillary: 147 mg/dL — ABNORMAL HIGH (ref 70–99)
Glucose-Capillary: 151 mg/dL — ABNORMAL HIGH (ref 70–99)
Glucose-Capillary: 177 mg/dL — ABNORMAL HIGH (ref 70–99)
Glucose-Capillary: 94 mg/dL (ref 70–99)

## 2023-09-04 LAB — LACTATE DEHYDROGENASE: LDH: 173 U/L (ref 98–192)

## 2023-09-04 LAB — IGG, IGA, IGM
IgA: 51 mg/dL — ABNORMAL LOW (ref 90–386)
IgG (Immunoglobin G), Serum: 1733 mg/dL — ABNORMAL HIGH (ref 603–1613)
IgM (Immunoglobulin M), Srm: <5 mg/dL — ABNORMAL LOW (ref 20–172)

## 2023-09-04 LAB — PATHOLOGIST SMEAR REVIEW

## 2023-09-04 LAB — PHOSPHORUS: Phosphorus: 3 mg/dL (ref 2.5–4.6)

## 2023-09-04 LAB — MAGNESIUM: Magnesium: 2.5 mg/dL — ABNORMAL HIGH (ref 1.7–2.4)

## 2023-09-04 NOTE — Progress Notes (Signed)
 Speech Language Pathology Treatment: Dysphagia  Patient Details Name: Frank [name]. MRN: [id number] DOB: 1965-05-07 Today's Date: 09/04/2023 Time: 8295-6213 SLP Time Calculation (min) (ACUTE ONLY): 18 min  Assessment / Plan / Recommendation Clinical Impression  SLP visited pt with wife at bedside. Wife reports pt has been tolerating clear liquid diet with occasional coughing. She provided some sippy cups with handles that pt can grip. The flow rate on these is quite slow and bolus size is limited. Pt is able to self feed with right hand using cups. SLP offered total assisted straw sips of grape juice; pt wanted large consecutive swallows of these. And again intermittent coughing occurred. Pt is able to continue clears and can sip from a straw or the bottles from home. Hopeful for upgrade to some solids next week though repeat instrumental testing will again be needed.   HPI HPI: 58 y.o. male transferred from [facility] 07/03/23 to [facility] for management of newly diagnosed plasma cell myeloma with weakness and AMS. Pt also with AKI and metabolic encephalopathy. 5/10 transfer to [facility] for iHD.Bedside swallow eval 4/18 wiht recs for NPO due to somnolence. 4/22 Intubated and chemo initiated. 4/23-5/10 CRRT. 5/6 IVIG started. 5/8 trach. 5/12 iHD initiated. 5/18 inability to oxygenate through trach, intubated and trach revision. PMHx:Lt THA, carpal tunnel syndrome, Lt TKA, Lt humerus fx, PAF, HTN, HLD, bipolar disorder, obesity, T2DM      SLP Plan  Continue with current plan of care          Recommendations  Diet recommendations: Thin liquid Liquids provided via: Cup;Straw (bottles ok if clean) Supervision: Staff to assist with self feeding;Full supervision/cueing for compensatory strategies Compensations: Slow rate;Hard cough after swallow Postural Changes and/or Swallow Maneuvers: Seated upright 90 degrees                  Oral care QID   Intermittent  Supervision/Assistance Dysphagia, oropharyngeal phase (R13.12)     Continue with current plan of care     Brentyn Seehafer, Hardin Leys  09/04/2023, 2:39 PM

## 2023-09-04 NOTE — Progress Notes (Signed)
 I think Frank [name] has done very nicely.  It sounds like there may be some issues with him being discharged.  It does not sound like he is able to do dialysis in a chair.  He is getting speech pathology.  He is getting physical therapy.  Hopefully, he will be able to eat and swallow regular food again.  His labs yesterday showed some neutropenia.  I gave him a dose of Neulasta.  This is improved his white cell count.  Today, his white count was 2.7.  Hemoglobin 8.6.  Platelet count is 115,000.  His electrolytes show sodium 136.  Potassium 3.7.  BUN 108.  Creatinine 1.65.  Calcium  10.5 with an albumin  of 2.1.  Will have to watch the calcium  closely.  Again, he has a very nice response to chemotherapy with the new protocol.  His light chains came down incredibly well.  This has impressive, the fact that his IgG level went from 4500 down to 1700 mg/dL.  I think that we can hold on his treatment today.  I really do not want a push this too much and really have problems with leukopenia and infections.  He has already had issues with the the Aspergillus and with Staphylococcus.  I do not see a problem with him holding treatment this week.  I would just cancel today's treatment.  I would make sure that next week he just gets his Sarclisa .  I think this would be very reasonable.  I think that if he has an outpatient we can still treat him even if he was in a nursing home.  Again this is really not that much of a myelosuppressive regimen that would cause problems with infections.  I know that there are a lot of issues going on with him.  The most pressing is clearly the deconditioning after being in a hospital for over 2 months.  At least, he is making progress.  Again, we will hold today's treatment.  In fact, I will just cancel today's treatment and just make sure he gets the Sarclisa  next week.  I probably would give him 1 more dose of Neulasta.  This should help his white cell count little bit  better.  [name], MD  1 Cor 15:57

## 2023-09-04 NOTE — Progress Notes (Signed)
 Patient ID: Frank [name]., male   DOB: 06/28/1965, 58 y.o.   MRN: [id number] [facility] Progress Note   Assessment/ Plan:    1. Acute kidney Injury on CKD3a: secondary to myeloma kidney. Transitioned to IHD on MWF schedule after previously being on CRRT; dialysis has been a little challenging in the face of hypotension.  Had extra HD on 6/10 to optimize clearance -------------------------------- - MWF HD.  Note predialysis administration of midodrine .  - Urine output has improved and showing signs of renal recovery.  Last dialysis on Monday.  BUN continues to rise and is somewhat high but mental status actually better today.  Will continue to hold dialysis but maintain dialysis catheter for now.  Reassess for dialysis needs daily. - TDC replaced after line holiday on 6/16.  Appreciate help from interventional radiology   2. Acute metabolic encephalopathy: appears to wax and wane per charting  - optimize HD as above   3. Multiple myeloma:  - Closely followed by Dr. Maria Shiner and started on aggressive regimen with Sarclisa , Kyprolis  and Cytoxan  - Appreciate oncology input  4. 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  Note plans to treat through 10/21/23 per ID note  5. Acute hypoxic respiratory failure - with trach removed on 6/11 - pulm following  6. Anemia: from acute illness and multiple myeloma. ESA and transfusions per onc  7. Bacteremia - noted staph epidermidis from 6/7 blood cultures.   - Repeat blood cultures on 6/9 are NGTD  - Status sling holiday - abx per primary team and ID   8. Hypercalcemia: Corrected calcium  fairly high around 11-12. Obtain PTH. Likely related to immobility.  Myeloma may contribute.  Consider bisphosphonate  Disposition - continue inpatient monitoring     Subjective:   Patient more awake and alert today.  States he feels well and wants to eat breakfast.  No other complaints     Objective:    BP (!) 118/95 (BP Location: Right Arm)   Pulse (!) 103   Temp 97.6 F (36.4 C) (Oral)   Resp 20   Ht 6' 0.01 (1.829 m)   Wt 128 kg   SpO2 95%   BMI 38.26 kg/m   Intake/Output Summary (Last 24 hours) at 09/04/2023 1009 Last data filed at 09/04/2023 0900 Gross per 24 hour  Intake 1110 ml  Output 2000 ml  Net -890 ml   Weight change: 1 kg  Physical Exam:       General adult male in bed in no acute distress HEENT normocephalic atraumatic no nasal discharge Lungs bilateral chest rise with no increased work of breathing Heart tachycardic, no rub Abdomen soft nontender nondistended Extremities no edema, warm and well-perfused Neuro, lying in bed, minimally interactive   Imaging: No results found.     Labs: BMET Recent Labs  Lab 08/29/23 0752 08/30/23 1101 08/31/23 1122 09/01/23 0449 09/01/23 0450 09/02/23 0420 09/03/23 0447 09/04/23 0412  NA 128* 128* 127* 127* 128* 133*  132* 134* 136  K 3.7 3.3* 3.4* 3.5 3.7 3.4*  3.4* 3.7 3.7  CL 92* 92* 90* 91* 92* 96*  96* 96* 96*  CO2 23 24 24 26 25 26  25 25 29   GLUCOSE 115* 127* 159* 113* 114* 111*  113* 103* 123*  BUN 100* 88* 117* 133* 133* 71*  72* 97* 108*  CREATININE 2.54* 2.01* 1.97* 1.89* 1.88* 1.25*  1.25* 1.58* 1.65*  CALCIUM  8.3* 8.4* 9.2 10.3 10.2 9.7  9.5  10.4* 10.5*  PHOS 3.1 3.1 3.1  --  3.9 2.9  --  3.0   CBC Recent Labs  Lab 09/01/23 0449 09/02/23 0420 09/03/23 0447 09/04/23 0412  WBC 2.9* 2.5* 1.7* 2.7*  NEUTROABS 2.3 1.8 0.8* 1.7  HGB 8.6* 8.9* 8.6* 8.6*  HCT 25.6* 27.0* 26.1* 26.4*  MCV 100.8* 102.3* 102.8* 103.9*  PLT 89* 91* 99* 115*    Medications:     acyclovir   200 mg Per Tube BID   arformoterol   15 mcg Nebulization BID   collagenase    Topical Daily   feeding supplement (NEPRO CARB STEADY)  355 mL Per Tube TID PC & HS   feeding supplement (PROSource TF20)  60 mL Per Tube Daily   filgrastim   480 mcg Subcutaneous Daily   heparin  injection (subcutaneous)  5,000 Units  Subcutaneous Q8H   heparin  sodium (porcine)  10,000 Units Intravenous Once   insulin  aspart  0-15 Units Subcutaneous TID AC & HS   insulin  aspart  2 Units Subcutaneous TID AC & HS   insulin  glargine-yfgn  10 Units Subcutaneous BID   midodrine   10 mg Per Tube Q M,W,F-HD   montelukast   5 mg Per Tube QHS   multivitamin  1 tablet Per Tube QHS   nicotine   7 mg Transdermal Daily   nutrition supplement (JUVEN)  1 packet Per Tube BID BM   mouth rinse  15 mL Mouth Rinse 4 times per day   PARoxetine   40 mg Per Tube Daily   pneumococcal 20-valent conjugate vaccine  0.5 mL Intramuscular Tomorrow-1000   polyethylene glycol  17 g Per Tube BID   revefenacin   175 mcg Nebulization Daily   senna  1 tablet Per Tube Daily   sodium chloride  flush  10-40 mL Intracatheter Q12H   voriconazole   350 mg Per Tube Q12H    Levorn Reason, MD 09/04/2023, 10:09 AM

## 2023-09-04 NOTE — Plan of Care (Signed)
 Pt had been calm and asleep throughout the night, though shouted at times to ask for some ice chips. No other complaints noted. Problem: Education: Goal: Knowledge of General Education information will improve Description: Including pain rating scale, medication(s)/side effects and non-pharmacologic comfort measures Outcome: Progressing   Problem: Clinical Measurements: Goal: Will remain free from infection Outcome: Progressing   Problem: Activity: Goal: Risk for activity intolerance will decrease Outcome: Progressing   Problem: Nutrition: Goal: Adequate nutrition will be maintained Outcome: Progressing   Problem: Coping: Goal: Level of anxiety will decrease Outcome: Progressing   Problem: Elimination: Goal: Will not experience complications related to bowel motility Outcome: Progressing   Problem: Elimination: Goal: Will not experience complications related to urinary retention Outcome: Progressing   Problem: Safety: Goal: Ability to remain free from injury will improve Outcome: Progressing   Problem: Skin Integrity: Goal: Risk for impaired skin integrity will decrease Outcome: Progressing

## 2023-09-04 NOTE — Progress Notes (Signed)
 PROGRESS NOTE  Frank [name]. ZOX:[id number] DOB: Apr 25, 1965 DOA: 07/03/2023 PCP: Serita Danes, MD   LOS: 63 days   Brief Narrative / Interim history: 58 year old PMH OSA CPAP, pathological fracture and multiple ORIF left Humerus likely due to multiple myeloma, A-fib not on anticoagulation, hyperlipidemia, tobacco use disorder who was  feeling weak for 1 week, admitted  to [facility] with concern of sepsis, community-acquired pneumonia and acute encephalopathy.  Patient was found to have lytic lesions,  he was transferred to [facility] for  oncology treatment.  In the meantime he developed encephalopathy, acute renal failure.  He remained in the ICU for 36 days.  Underwent tracheostomy. He was also started on HD after  CRRT.  Subsequently PEG tube placed 5/29.   Significant events 4/17-Admitted to medical floor with oncology consultation to treat multiple myeloma. 4/19-transferred to ICU intubated, encephalopathic. 4/20-A-fib with RVR. Started heparin . Groin and biopsy site hematoma requiring multiple transfusions. EEG 4/25 >> moderate to severe encephalopathy without any seizures or epileptiform discharges. 4/29 Received Velcade  and Cytoxan  chemotherapy-intent palliative? 5/2 LP and ETT exchange  5/8 Trach placed. Some post op bleeding improved with thrombi-pad 5/10 Voriconazole  added back based on CT Chest findings 5/13 more alert and able to follow some commands. 5/23, transferred to floor. 5/29-PEG placed 5/31 chemotherapy changed to Sarclisa /Kyprolis /Cytoxan . 6/13 plan to remove HD cathter for holiday line 6/16 new HD catheter placed  Subjective / 24h Interval events: Is a bit sleepy this morning, but wakes up, opens his eyes, very flat affect  Assesement and Plan: Principal Problem:   Multiple myeloma not having achieved remission (HCC) Active Problems:   AKI (acute kidney injury) (HCC)   Acute hypoxemic respiratory failure (HCC)   Goals of care,  counseling/discussion   Acute metabolic encephalopathy   Lobar pneumonia, unspecified organism (HCC)   Fever   Aspergillosis, unspecified (HCC)   Hematoma of right thigh   Generalized weakness   Atrial fibrillation with rapid ventricular response (HCC)   PAF (paroxysmal atrial fibrillation) (HCC)   On mechanically assisted ventilation (HCC)   Multiple myeloma without remission (HCC)   Protein-calorie malnutrition, severe   Malfunction of tracheostomy stoma (HCC)   Seizure disorder (HCC)   DMII (diabetes mellitus, type 2) (HCC)   Principal problem Recurrent fevers, sepsis, Staph Epi Bacteremia -Hospital course complicated by intermittent fevers, ID consulted and most recently evaluated patient 6/17.  He had a fever and MRSE bacteremia 1/4 bottles, currently on vancomycin  with end date 6/20.  He received a line holiday - Repeat blood culture 6/9 without growth - He has unstageable sacral ulcer, continue local wound care.  He is afebrile  Active problems  Aspergillosis with pneumonia -On Voriconazole  per ID, On Acyclovir .  End date for voriconazole  is 8/5   Multiple myeloma - He was  started on Velcade  and Cytoxan .  Chemotherapy changed to Sarclisa /Kyprolis /Cytoxan  on 5/31. Dr Maria Shiner following. Getting Chemo, 3 weeks on and 1 week off.  Discussed with Dr. Maria Shiner this morning, counts have not recovered and for now chemotherapy is on hold   Goals of care - Unfortunately poor long-term prognosis. Palliative care has discussed with wife previously.  Remain full code on full scope of care   Acute Hypoxic Respiratory Failure - Pulmonology following intermittently, trach removed 6/11.  Respiratory status is stable, currently on room air   Right Thigh Hematoma - per CT on 08/01/23: 12.1 x 30.2 x 7.6 cm subcutaneous hematoma within the anterior right thigh. 8.4 x 10.4  x 22.2 cm subcutaneous hematoma within the right gluteal region - continue supportive care.  Hemoglobin stable   Severe acute  metabolic encephalopathy with hypoactive delirium, seizure disorder - Normal MRI of the brain.  LP negative for meningitis or encephalitis.  EEG negative for seizures but encephalopathy. Home Depakote  discontinued in ICU.   Insulin -dependent diabetes type 2 with hyperglycemia- A1c 6.0, continue with sliding scale insulin   CBG (last 3)  Recent Labs    09/04/23 0035 09/04/23 0431 09/04/23 0723  GLUCAP 146* 121* 94    AKI on CKD stage IIIa, Uremia,  Hyponatremia, Hypokalemia -Due to MM, hypercalcemia and ACE inhibitors?  Nephrology consulted, now requiring HD.  Needs to be able to sit in the recliner to qualify for outpatient HD   Paroxysmal A-fib - Unable to  anticoagulation due to hematoma. Rate controlled.    Obstructive Sleep Apnea -Trach removed   Anxiety/Bipolar disorder - Continue with  Paxil  and Depakote    Severe Malnutrition - Continue tube feeding.   History of Pathological fracture and multiple ORIF left Humerus 01/2023   Hypokalemia - correction with hd   Hyponatremia - Correction with HD.    Scheduled Meds:  acyclovir   200 mg Per Tube BID   arformoterol   15 mcg Nebulization BID   collagenase    Topical Daily   feeding supplement (NEPRO CARB STEADY)  355 mL Per Tube TID PC & HS   feeding supplement (PROSource TF20)  60 mL Per Tube Daily   filgrastim   480 mcg Subcutaneous Daily   heparin  injection (subcutaneous)  5,000 Units Subcutaneous Q8H   heparin  sodium (porcine)  10,000 Units Intravenous Once   insulin  aspart  0-15 Units Subcutaneous TID AC & HS   insulin  aspart  2 Units Subcutaneous TID AC & HS   insulin  glargine-yfgn  10 Units Subcutaneous BID   midodrine   10 mg Per Tube Q M,W,F-HD   montelukast   5 mg Per Tube QHS   multivitamin  1 tablet Per Tube QHS   nicotine   7 mg Transdermal Daily   nutrition supplement (JUVEN)  1 packet Per Tube BID BM   mouth rinse  15 mL Mouth Rinse 4 times per day   PARoxetine   40 mg Per Tube Daily   pneumococcal 20-valent  conjugate vaccine  0.5 mL Intramuscular Tomorrow-1000   polyethylene glycol  17 g Per Tube BID   revefenacin   175 mcg Nebulization Daily   senna  1 tablet Per Tube Daily   sodium chloride  flush  10-40 mL Intracatheter Q12H   voriconazole   350 mg Per Tube Q12H   Continuous Infusions:  sodium chloride  Stopped (08/28/23 1208)   sodium chloride  Stopped (08/28/23 0839)   valproate sodium  750 mg (09/03/23 2230)   PRN Meds:.acetaminophen  (TYLENOL ) oral liquid 160 mg/5 mL **OR** acetaminophen , hydrALAZINE , hydrOXYzine , ipratropium-albuterol , ondansetron  (ZOFRAN ) IV, oxyCODONE , artificial tears, sodium chloride  flush  Current Outpatient Medications  Medication Instructions   albuterol  (PROVENTIL  HFA;VENTOLIN  HFA) 108 (90 Base) MCG/ACT inhaler 2 puffs, Inhalation, Every 6 hours PRN   albuterol  (PROVENTIL ) 2.5 mg, Every 2 hours PRN   aspirin  EC 81 mg, Oral, Daily, Swallow whole.   buPROPion (WELLBUTRIN XL) 150 mg, Daily   busPIRone  (BUSPAR ) 15 mg, Oral, Daily at bedtime   celecoxib  (CELEBREX ) 200 mg, Oral, Daily   clonazePAM  (KLONOPIN ) 1 mg, Oral, Once PRN   cyclobenzaprine  (FLEXERIL ) 5 mg, Oral, 3 times daily PRN   divalproex  (DEPAKOTE ) 500 mg, Oral, 2 times daily   EPINEPHrine  (EPI-PEN) 0.3 mg, Intramuscular, As  needed   esomeprazole (NEXIUM) 40 mg, Oral, Every morning   Fluticasone -Salmeterol (ADVAIR) 500-50 MCG/DOSE AEPB 2 puffs, Inhalation, Every morning   furosemide  (LASIX ) 40 mg, Oral, Daily PRN   gabapentin  (NEURONTIN ) 300-600 mg, Oral, See admin instructions, 300 mg in the morning, 600 mg at bedtime    hydrALAZINE  (APRESOLINE ) 10 mg, Oral, 2 times daily   hydrochlorothiazide  (HYDRODIURIL ) 25 mg, Oral, Daily   lisinopril  (ZESTRIL ) 40 mg, Oral, Daily   metFORMIN  (GLUCOPHAGE ) 1,000 mg, Oral, 2 times daily with meals   montelukast  (SINGULAIR ) 10 mg, Oral, Daily at bedtime   OVER THE COUNTER MEDICATION 400 mg, Oral, Daily at bedtime, Burdock root    oxyCODONE  (ROXICODONE ) 5 mg, Oral,  Every 4 hours PRN   PARoxetine  (PAXIL ) 40 mg, Oral, BH-each morning   spironolactone  (ALDACTONE ) 25 mg, Oral, Daily at bedtime   tamsulosin  (FLOMAX ) 0.4 mg, Oral, Daily at bedtime   testosterone  cypionate (DEPOTESTOSTERONE CYPIONATE) 100 mg, Intramuscular, Every 14 days   verapamil  (CALAN -SR) 120 mg, Oral, Daily   Vitamin D  (Ergocalciferol ) (DRISDOL ) 50,000 Units, Oral, Every Thu    Diet Orders (From admission, onward)     Start     Ordered   09/03/23 1219  Diet clear liquid Room service appropriate? No; Fluid consistency: Thin  Diet effective now       Question Answer Comment  Room service appropriate? No   Fluid consistency: Thin      09/03/23 1219            DVT prophylaxis: heparin  injection 5,000 Units Start: 08/14/23 2200 Place and maintain sequential compression device Start: 07/14/23 1033   Lab Results  Component Value Date   PLT 115 (L) 09/04/2023      Code Status: Full Code  Family Communication: no family at bedside   Status is: Inpatient Remains inpatient appropriate because: severity of illness  Level of care: Progressive  Consultants:  ID Nephrology Oncology  Objective: Vitals:   09/04/23 0401 09/04/23 0500 09/04/23 0718 09/04/23 0825  BP: (!) 123/101  (!) 118/95   Pulse: 99  (!) 103 (!) 103  Resp: 18  18 20   Temp: (!) 97.5 F (36.4 C)  97.6 F (36.4 C)   TempSrc: Oral  Oral   SpO2: 98%  95%   Weight:  128 kg    Height:        Intake/Output Summary (Last 24 hours) at 09/04/2023 0909 Last data filed at 09/04/2023 0900 Gross per 24 hour  Intake 840 ml  Output 2000 ml  Net -1160 ml   Wt Readings from Last 3 Encounters:  09/04/23 128 kg  06/05/23 113.7 kg  01/29/23 117.9 kg    Examination:  Constitutional: NAD Eyes: lids and conjunctivae normal, no scleral icterus ENMT: mmm Neck: normal, supple Respiratory: clear to auscultation bilaterally, no wheezing, no crackles. Normal respiratory effort.  Cardiovascular: Regular rate and  rhythm, no murmurs / rubs / gallops. No LE edema. Abdomen: soft, no distention, no tenderness. Bowel sounds positive.   Data Reviewed: I have independently reviewed following labs and imaging studies   CBC Recent Labs  Lab 08/31/23 1122 09/01/23 0449 09/02/23 0420 09/03/23 0447 09/04/23 0412  WBC 3.1* 2.9* 2.5* 1.7* 2.7*  HGB 8.6* 8.6* 8.9* 8.6* 8.6*  HCT 25.7* 25.6* 27.0* 26.1* 26.4*  PLT 85* 89* 91* 99* 115*  MCV 101.2* 100.8* 102.3* 102.8* 103.9*  MCH 33.9 33.9 33.7 33.9 33.9  MCHC 33.5 33.6 33.0 33.0 32.6  RDW 22.3* 22.4* 22.5* 22.2* 22.5*  LYMPHSABS 0.3* 0.4* 0.4* 0.6* 0.5*  MONOABS 0.2 0.3 0.3 0.3 0.5  EOSABS 0.0 0.0 0.0 0.0 0.0  BASOSABS 0.0 0.0 0.0 0.0 0.0    Recent Labs  Lab 08/31/23 1122 09/01/23 0449 09/01/23 0450 09/02/23 0420 09/03/23 0447 09/04/23 0412  NA 127* 127* 128* 133*  132* 134* 136  K 3.4* 3.5 3.7 3.4*  3.4* 3.7 3.7  CL 90* 91* 92* 96*  96* 96* 96*  CO2 24 26 25 26  25 25 29   GLUCOSE 159* 113* 114* 111*  113* 103* 123*  BUN 117* 133* 133* 71*  72* 97* 108*  CREATININE 1.97* 1.89* 1.88* 1.25*  1.25* 1.58* 1.65*  CALCIUM  9.2 10.3 10.2 9.7  9.5 10.4* 10.5*  AST 12* 11*  --  12* 13* 14*  ALT 11 11  --  10 8 8   ALKPHOS 49 51  --  65 68 74  BILITOT 0.6 0.7  --  0.6 0.7 0.7  ALBUMIN  2.1* 2.0* 2.0* 2.1*  2.1* 2.2* 2.1*  MG  --   --   --   --   --  2.5*    ------------------------------------------------------------------------------------------------------------------ No results for input(s): CHOL, HDL, LDLCALC, TRIG, CHOLHDL, LDLDIRECT in the last 72 hours.  Lab Results  Component Value Date   HGBA1C 6.0 (H) 08/14/2023   ------------------------------------------------------------------------------------------------------------------ No results for input(s): TSH, T4TOTAL, T3FREE, THYROIDAB in the last 72 hours.  Invalid input(s): FREET3  Cardiac Enzymes No results for input(s): CKMB, TROPONINI,  MYOGLOBIN in the last 168 hours.  Invalid input(s): CK ------------------------------------------------------------------------------------------------------------------    Component Value Date/Time   BNP 301.2 (H) 07/23/2023 0816    CBG: Recent Labs  Lab 09/03/23 1946 09/03/23 2223 09/04/23 0035 09/04/23 0431 09/04/23 0723  GLUCAP 158* 109* 146* 121* 94    Recent Results (from the past 240 hours)  Culture, blood (Routine X 2) w Reflex to ID Panel     Status: None   Collection Time: 08/25/23 11:09 AM   Specimen: BLOOD RIGHT ARM  Result Value Ref Range Status   Specimen Description BLOOD RIGHT ARM  Final   Special Requests   Final    BOTTLES DRAWN AEROBIC ONLY Blood Culture adequate volume   Culture   Final    NO GROWTH 5 DAYS Performed at [facility], [address], [place], [place] [address]    Report Status 08/30/2023 FINAL  Final  Culture, blood (Routine X 2) w Reflex to ID Panel     Status: None   Collection Time: 08/25/23 11:10 AM   Specimen: BLOOD LEFT HAND  Result Value Ref Range Status   Specimen Description BLOOD LEFT HAND  Final   Special Requests   Final    BOTTLES DRAWN AEROBIC ONLY Blood Culture results may not be optimal due to an inadequate volume of blood received in culture bottles   Culture   Final    NO GROWTH 5 DAYS Performed at [facility], [address] [place], [place] [address]    Report Status 08/30/2023 FINAL  Final     Radiology Studies: No results found.   Kathlen Para, MD, PhD [facility]  Between 7 am - 7 pm I am available, please contact me via Amion (for emergencies) or Securechat (non urgent messages)  Between 7 pm - 7 am I am not available, please contact night coverage MD/APP via Amion

## 2023-09-04 NOTE — Plan of Care (Signed)
 Has been in good spirits all day.  Very excited that he actually has a clear liquid diet.  Alert and talkative.  Wife came to see him and stayed several hours and that made him happy.  No signs of distress.    Problem: Clinical Measurements: Goal: Respiratory complications will improve Outcome: Progressing   Problem: Activity: Goal: Risk for activity intolerance will decrease Outcome: Progressing   Problem: Coping: Goal: Level of anxiety will decrease Outcome: Progressing   Problem: Elimination: Goal: Will not experience complications related to bowel motility Outcome: Progressing   Problem: Pain Managment: Goal: General experience of comfort will improve and/or be controlled Outcome: Progressing   Problem: Safety: Goal: Ability to remain free from injury will improve Outcome: Progressing   Problem: Skin Integrity: Goal: Risk for impaired skin integrity will decrease Outcome: Progressing

## 2023-09-05 DIAGNOSIS — C9000 Multiple myeloma not having achieved remission: Secondary | ICD-10-CM | POA: Diagnosis not present

## 2023-09-05 LAB — CBC WITH DIFFERENTIAL/PLATELET
Abs Immature Granulocytes: 0 10*3/uL (ref 0.00–0.07)
Basophils Absolute: 0 10*3/uL (ref 0.0–0.1)
Basophils Relative: 0 %
Eosinophils Absolute: 0 10*3/uL (ref 0.0–0.5)
Eosinophils Relative: 0 %
HCT: 27 % — ABNORMAL LOW (ref 39.0–52.0)
Hemoglobin: 9 g/dL — ABNORMAL LOW (ref 13.0–17.0)
Lymphocytes Relative: 10 %
Lymphs Abs: 0.7 10*3/uL (ref 0.7–4.0)
MCH: 34.5 pg — ABNORMAL HIGH (ref 26.0–34.0)
MCHC: 33.3 g/dL (ref 30.0–36.0)
MCV: 103.4 fL — ABNORMAL HIGH (ref 80.0–100.0)
Monocytes Absolute: 0.5 10*3/uL (ref 0.1–1.0)
Monocytes Relative: 7 %
Neutro Abs: 6.1 10*3/uL (ref 1.7–7.7)
Neutrophils Relative %: 83 %
Platelets: 124 10*3/uL — ABNORMAL LOW (ref 150–400)
RBC: 2.61 MIL/uL — ABNORMAL LOW (ref 4.22–5.81)
RDW: 22.5 % — ABNORMAL HIGH (ref 11.5–15.5)
WBC: 7.3 10*3/uL (ref 4.0–10.5)
nRBC: 0 /100{WBCs}
nRBC: 0.4 % — ABNORMAL HIGH (ref 0.0–0.2)

## 2023-09-05 LAB — BASIC METABOLIC PANEL WITH GFR
Anion gap: 12 (ref 5–15)
BUN: 100 mg/dL — ABNORMAL HIGH (ref 6–20)
CO2: 25 mmol/L (ref 22–32)
Calcium: 9.8 mg/dL (ref 8.9–10.3)
Chloride: 91 mmol/L — ABNORMAL LOW (ref 98–111)
Creatinine, Ser: 1.49 mg/dL — ABNORMAL HIGH (ref 0.61–1.24)
GFR, Estimated: 54 mL/min — ABNORMAL LOW (ref >60)
Glucose, Bld: 78 mg/dL (ref 70–99)
Potassium: 3.5 mmol/L (ref 3.5–5.1)
Sodium: 128 mmol/L — ABNORMAL LOW (ref 135–145)

## 2023-09-05 LAB — GLUCOSE, CAPILLARY
Glucose-Capillary: 127 mg/dL — ABNORMAL HIGH (ref 70–99)
Glucose-Capillary: 136 mg/dL — ABNORMAL HIGH (ref 70–99)
Glucose-Capillary: 143 mg/dL — ABNORMAL HIGH (ref 70–99)
Glucose-Capillary: 78 mg/dL (ref 70–99)
Glucose-Capillary: 83 mg/dL (ref 70–99)
Glucose-Capillary: 95 mg/dL (ref 70–99)

## 2023-09-05 LAB — LACTATE DEHYDROGENASE: LDH: 167 U/L (ref 98–192)

## 2023-09-05 LAB — PATHOLOGIST SMEAR REVIEW

## 2023-09-05 LAB — PARATHYROID HORMONE, INTACT (NO CA): PTH: 8 pg/mL — ABNORMAL LOW (ref 15–65)

## 2023-09-05 MED ORDER — INSULIN GLARGINE-YFGN 100 UNIT/ML ~~LOC~~ SOLN
15.0000 [IU] | SUBCUTANEOUS | Status: DC
  Administered 2023-09-06 – 2023-09-19 (×14): 15 [IU] via SUBCUTANEOUS
  Filled 2023-09-05 (×15): qty 0.15

## 2023-09-05 MED ORDER — INSULIN GLARGINE-YFGN 100 UNIT/ML ~~LOC~~ SOLN
5.0000 [IU] | Freq: Once | SUBCUTANEOUS | Status: AC
  Administered 2023-09-05: 5 [IU] via SUBCUTANEOUS
  Filled 2023-09-05: qty 0.05

## 2023-09-05 NOTE — Plan of Care (Signed)
  Problem: Education: Goal: Knowledge of General Education information will improve Description: Including pain rating scale, medication(s)/side effects and non-pharmacologic comfort measures Outcome: Progressing   Problem: Health Behavior/Discharge Planning: Goal: Ability to manage health-related needs will improve Outcome: Progressing   Problem: Clinical Measurements: Goal: Ability to maintain clinical measurements within normal limits will improve Outcome: Progressing Goal: Will remain free from infection Outcome: Progressing Goal: Diagnostic test results will improve Outcome: Progressing Goal: Respiratory complications will improve Outcome: Progressing Goal: Cardiovascular complication will be avoided Outcome: Progressing   Problem: Activity: Goal: Risk for activity intolerance will decrease Outcome: Progressing   Problem: Nutrition: Goal: Adequate nutrition will be maintained Outcome: Progressing   Problem: Coping: Goal: Level of anxiety will decrease Outcome: Progressing   Problem: Elimination: Goal: Will not experience complications related to bowel motility Outcome: Progressing Goal: Will not experience complications related to urinary retention Outcome: Progressing   Problem: Pain Managment: Goal: General experience of comfort will improve and/or be controlled Outcome: Progressing   Problem: Safety: Goal: Ability to remain free from injury will improve Outcome: Progressing   Problem: Skin Integrity: Goal: Risk for impaired skin integrity will decrease Outcome: Progressing   Problem: Education: Goal: Ability to describe self-care measures that may prevent or decrease complications (Diabetes Survival Skills Education) will improve Outcome: Progressing Goal: Individualized Educational Video(s) Outcome: Progressing   Problem: Coping: Goal: Ability to adjust to condition or change in health will improve Outcome: Progressing   Problem: Fluid  Volume: Goal: Ability to maintain a balanced intake and output will improve Outcome: Progressing   Problem: Health Behavior/Discharge Planning: Goal: Ability to identify and utilize available resources and services will improve Outcome: Progressing Goal: Ability to manage health-related needs will improve Outcome: Progressing   Problem: Metabolic: Goal: Ability to maintain appropriate glucose levels will improve Outcome: Progressing   Problem: Nutritional: Goal: Maintenance of adequate nutrition will improve Outcome: Progressing Goal: Progress toward achieving an optimal weight will improve Outcome: Progressing   Problem: Skin Integrity: Goal: Risk for impaired skin integrity will decrease Outcome: Progressing   Problem: Tissue Perfusion: Goal: Adequacy of tissue perfusion will improve Outcome: Progressing   Problem: Education: Goal: Knowledge about tracheostomy care/management will improve Outcome: Progressing   Problem: Activity: Goal: Ability to tolerate increased activity will improve Outcome: Progressing   Problem: Health Behavior/Discharge Planning: Goal: Ability to manage tracheostomy will improve Outcome: Progressing   Problem: Respiratory: Goal: Patent airway maintenance will improve Outcome: Progressing   Problem: Role Relationship: Goal: Ability to communicate will improve Outcome: Progressing   Problem: Education: Goal: Knowledge of disease and its progression will improve Outcome: Progressing Goal: Individualized Educational Video(s) Outcome: Progressing   Problem: Fluid Volume: Goal: Compliance with measures to maintain balanced fluid volume will improve Outcome: Progressing   Problem: Health Behavior/Discharge Planning: Goal: Ability to manage health-related needs will improve Outcome: Progressing   Problem: Nutritional: Goal: Ability to make healthy dietary choices will improve Outcome: Progressing

## 2023-09-05 NOTE — Progress Notes (Signed)
 Patient ID: Frank [name]., male   DOB: 1965/07/26, 58 y.o.   MRN: [id number] [facility] Progress Note   Assessment/ Plan:    1. Acute kidney Injury on CKD3a: secondary to myeloma kidney. Transitioned to IHD on MWF schedule after previously being on CRRT --------------------------------   Note predialysis administration of midodrine .  - Urine output has improved and showing signs of renal recovery.  Last dialysis on Monday  6/16    BUN continued to rise and is somewhat high but mental status actually better today.  Will continue to hold dialysis but maintain dialysis catheter for now.  Reassess for dialysis needs daily.  BUN and crt down a little today-  cont to hold HD - TDC replaced after line holiday on 6/16-  keep line in for now   2. Acute metabolic encephalopathy: appears to wax and wane per charting  - optimize HD as above   3. Multiple myeloma:  - Closely followed by Dr. Maria Shiner and started on aggressive regimen with Sarclisa , Kyprolis  and Cytoxan  - Appreciate oncology input  4. 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  Note plans to treat through 10/21/23 per ID note  5. Acute hypoxic respiratory failure - trach removed on 6/11 - pulm following  6. Anemia: from acute illness and multiple myeloma. ESA and transfusions per onc  7. Bacteremia - noted staph epidermidis from 6/7 blood cultures.   - Repeat blood cultures on 6/9 are NGTD   - abx per primary team and ID   8. Hypercalcemia: Corrected calcium  fairly high around 11-12. Obtain PTH. Likely related to immobility.  Myeloma may contribute.  Consider bisphosphonate-  is trending down      Subjective:   1900 of UOP-  bun and crt trended down today ! Very sleepy but did arouse with stimuli     Objective:   BP 111/80 (BP Location: Right Arm)   Pulse 95   Temp 98.3 F (36.8 C) (Oral)   Resp 19   Ht 6' 0.01 (1.829 m)   Wt (!) 138 kg   SpO2 95%   BMI 41.25  kg/m   Intake/Output Summary (Last 24 hours) at 09/05/2023 0957 Last data filed at 09/05/2023 0100 Gross per 24 hour  Intake --  Output 1600 ml  Net -1600 ml   Weight change: 10 kg  Physical Exam:       General adult male in bed in no acute distress HEENT normocephalic atraumatic no nasal discharge Lungs bilateral chest rise with no increased work of breathing Heart tachycardic, no rub Abdomen soft nontender nondistended Extremities min edema, warm and well-perfused Neuro, lying in bed, minimally interactive   Imaging: No results found.     Labs: BMET Recent Labs  Lab 08/30/23 1101 08/31/23 1122 09/01/23 0449 09/01/23 0450 09/02/23 0420 09/03/23 0447 09/04/23 0412 09/05/23 0522  NA 128* 127* 127* 128* 133*  132* 134* 136 128*  K 3.3* 3.4* 3.5 3.7 3.4*  3.4* 3.7 3.7 3.5  CL 92* 90* 91* 92* 96*  96* 96* 96* 91*  CO2 24 24 26 25 26  25 25 29 25   GLUCOSE 127* 159* 113* 114* 111*  113* 103* 123* 78  BUN 88* 117* 133* 133* 71*  72* 97* 108* 100*  CREATININE 2.01* 1.97* 1.89* 1.88* 1.25*  1.25* 1.58* 1.65* 1.49*  CALCIUM  8.4* 9.2 10.3 10.2 9.7  9.5 10.4* 10.5* 9.8  PHOS 3.1 3.1  --  3.9 2.9  --  3.0  --    CBC Recent Labs  Lab 09/02/23 0420 09/03/23 0447 09/04/23 0412 09/05/23 0522  WBC 2.5* 1.7* 2.7* 7.3  NEUTROABS 1.8 0.8* 1.7 6.1  HGB 8.9* 8.6* 8.6* 9.0*  HCT 27.0* 26.1* 26.4* 27.0*  MCV 102.3* 102.8* 103.9* 103.4*  PLT 91* 99* 115* 124*    Medications:     acyclovir   200 mg Per Tube BID   arformoterol   15 mcg Nebulization BID   collagenase    Topical Daily   feeding supplement (NEPRO CARB STEADY)  355 mL Per Tube TID PC & HS   feeding supplement (PROSource TF20)  60 mL Per Tube Daily   filgrastim   480 mcg Subcutaneous Daily   heparin  injection (subcutaneous)  5,000 Units Subcutaneous Q8H   heparin  sodium (porcine)  10,000 Units Intravenous Once   insulin  aspart  0-15 Units Subcutaneous TID AC & HS   insulin  aspart  2 Units Subcutaneous TID  AC & HS   insulin  glargine-yfgn  10 Units Subcutaneous BID   midodrine   10 mg Per Tube Q M,W,F-HD   montelukast   5 mg Per Tube QHS   multivitamin  1 tablet Per Tube QHS   nicotine   7 mg Transdermal Daily   nutrition supplement (JUVEN)  1 packet Per Tube BID BM   mouth rinse  15 mL Mouth Rinse 4 times per day   PARoxetine   40 mg Per Tube Daily   pneumococcal 20-valent conjugate vaccine  0.5 mL Intramuscular Tomorrow-1000   polyethylene glycol  17 g Per Tube BID   revefenacin   175 mcg Nebulization Daily   senna  1 tablet Per Tube Daily   sodium chloride  flush  10-40 mL Intracatheter Q12H   voriconazole   350 mg Per Tube Q12H    Reche Canales, MD 09/05/2023, 9:57 AM

## 2023-09-05 NOTE — Progress Notes (Signed)
 PROGRESS NOTE  Frank [name]. ZOX:[id number] DOB: Feb 09, 1966 DOA: 07/03/2023 PCP: Serita Danes, MD   LOS: 64 days   Brief Narrative / Interim history: 58 year old PMH OSA CPAP, pathological fracture and multiple ORIF left Humerus likely due to multiple myeloma, A-fib not on anticoagulation, hyperlipidemia, tobacco use disorder who was  feeling weak for 1 week, admitted  to [facility] health with concern of sepsis, community-acquired pneumonia and acute encephalopathy.  Patient was found to have lytic lesions,  he was transferred to [facility] for  oncology treatment.  In the meantime he developed encephalopathy, acute renal failure.  He remained in the ICU for 36 days.  Underwent tracheostomy. He was also started on HD after  CRRT.  Subsequently PEG tube placed 5/29.   Significant events 4/17-Admitted to medical floor with oncology consultation to treat multiple myeloma. 4/19-transferred to ICU intubated, encephalopathic. 4/20-A-fib with RVR. Started heparin . Groin and biopsy site hematoma requiring multiple transfusions. EEG 4/25 >> moderate to severe encephalopathy without any seizures or epileptiform discharges. 4/29 Received Velcade  and Cytoxan  chemotherapy-intent palliative? 5/2 LP and ETT exchange  5/8 Trach placed. Some post op bleeding improved with thrombi-pad 5/10 Voriconazole  added back based on CT Chest findings 5/13 more alert and able to follow some commands. 5/23, transferred to floor. 5/29-PEG placed 5/31 chemotherapy changed to Sarclisa /Kyprolis /Cytoxan . 6/13 plan to remove HD cathter for holiday line 6/16 new HD catheter placed  Subjective / 24h Interval events: No complaints, denies any shortness of breath  Assesement and Plan: Principal Problem:   Multiple myeloma not having achieved remission (HCC) Active Problems:   AKI (acute kidney injury) (HCC)   Acute hypoxemic respiratory failure (HCC)   Goals of care, counseling/discussion   Acute metabolic  encephalopathy   Lobar pneumonia, unspecified organism (HCC)   Fever   Aspergillosis, unspecified (HCC)   Hematoma of right thigh   Generalized weakness   Atrial fibrillation with rapid ventricular response (HCC)   PAF (paroxysmal atrial fibrillation) (HCC)   On mechanically assisted ventilation (HCC)   Multiple myeloma without remission (HCC)   Protein-calorie malnutrition, severe   Malfunction of tracheostomy stoma (HCC)   Seizure disorder (HCC)   DMII (diabetes mellitus, type 2) (HCC)   Principal problem Recurrent fevers, sepsis, Staph Epi Bacteremia -Hospital course complicated by intermittent fevers, ID consulted and most recently evaluated patient 6/17.  He had a fever and MRSE bacteremia 1/4 bottles, currently on vancomycin  with end date today, 6/20.  He received a line holiday - Repeat blood culture 6/9 without growth - He has unstageable sacral ulcer, continue local wound care.  He is afebrile  Active problems  Aspergillosis with pneumonia -On Voriconazole  per ID, On Acyclovir .  End date for voriconazole  is 8/5   Multiple myeloma - He was  started on Velcade  and Cytoxan .  Chemotherapy changed to Sarclisa /Kyprolis /Cytoxan  on 5/31. Dr Maria Shiner following. Getting Chemo, 3 weeks on and 1 week off.  Discussed with Dr. Maria Shiner this morning, counts have not recovered and for now chemotherapy is on hold   Goals of care - Unfortunately poor long-term prognosis. Palliative care has discussed with wife previously.  Remain full code on full scope of care   Acute Hypoxic Respiratory Failure - Pulmonology following intermittently, trach removed 6/11.  Respiratory status is stable, continue supportive care   Right Thigh Hematoma - per CT on 08/01/23: 12.1 x 30.2 x 7.6 cm subcutaneous hematoma within the anterior right thigh. 8.4 x 10.4 x 22.2 cm subcutaneous hematoma within the right  gluteal region - continue supportive care.  Hemoglobin stable   Severe acute metabolic encephalopathy with  hypoactive delirium, seizure disorder - Normal MRI of the brain.  LP negative for meningitis or encephalitis.  EEG negative for seizures but encephalopathy. Home Depakote  discontinued in ICU.   Insulin -dependent diabetes type 2 with hyperglycemia- A1c 6.0, continue with sliding scale insulin   CBG (last 3)  Recent Labs    09/04/23 2357 09/05/23 0428 09/05/23 0734  GLUCAP 133* 83 78    AKI on CKD stage IIIa, Uremia,  Hyponatremia, Hypokalemia -Due to MM, hypercalcemia and ACE inhibitors?  Nephrology consulted, now requiring HD.  Needs to be able to sit in the recliner to qualify for outpatient HD   Paroxysmal A-fib - Unable to  anticoagulation due to hematoma. Rate controlled.    Obstructive Sleep Apnea -Trach removed   Anxiety/Bipolar disorder - Continue with Paxil  and Depakote    Severe Malnutrition - Continue tube feeding.   History of Pathological fracture and multiple ORIF left Humerus 01/2023   Hypokalemia - correction with hd   Hyponatremia - Correction with HD.    Scheduled Meds:  acyclovir   200 mg Per Tube BID   arformoterol   15 mcg Nebulization BID   collagenase    Topical Daily   feeding supplement (NEPRO CARB STEADY)  355 mL Per Tube TID PC & HS   feeding supplement (PROSource TF20)  60 mL Per Tube Daily   filgrastim   480 mcg Subcutaneous Daily   heparin  injection (subcutaneous)  5,000 Units Subcutaneous Q8H   heparin  sodium (porcine)  10,000 Units Intravenous Once   insulin  aspart  0-15 Units Subcutaneous TID AC & HS   insulin  aspart  2 Units Subcutaneous TID AC & HS   [START ON 09/06/2023] insulin  glargine-yfgn  15 Units Subcutaneous Q24H   insulin  glargine-yfgn  5 Units Subcutaneous Once   midodrine   10 mg Per Tube Q M,W,F-HD   montelukast   5 mg Per Tube QHS   multivitamin  1 tablet Per Tube QHS   nicotine   7 mg Transdermal Daily   nutrition supplement (JUVEN)  1 packet Per Tube BID BM   mouth rinse  15 mL Mouth Rinse 4 times per day   PARoxetine   40 mg Per  Tube Daily   pneumococcal 20-valent conjugate vaccine  0.5 mL Intramuscular Tomorrow-1000   polyethylene glycol  17 g Per Tube BID   revefenacin   175 mcg Nebulization Daily   senna  1 tablet Per Tube Daily   sodium chloride  flush  10-40 mL Intracatheter Q12H   voriconazole   350 mg Per Tube Q12H   Continuous Infusions:  sodium chloride  Stopped (08/28/23 1208)   sodium chloride  Stopped (08/28/23 0839)   valproate sodium  750 mg (09/05/23 0944)   PRN Meds:.acetaminophen  (TYLENOL ) oral liquid 160 mg/5 mL **OR** acetaminophen , hydrALAZINE , hydrOXYzine , ipratropium-albuterol , ondansetron  (ZOFRAN ) IV, oxyCODONE , artificial tears, sodium chloride  flush  Current Outpatient Medications  Medication Instructions   albuterol  (PROVENTIL  HFA;VENTOLIN  HFA) 108 (90 Base) MCG/ACT inhaler 2 puffs, Inhalation, Every 6 hours PRN   albuterol  (PROVENTIL ) 2.5 mg, Every 2 hours PRN   aspirin  EC 81 mg, Oral, Daily, Swallow whole.   buPROPion (WELLBUTRIN XL) 150 mg, Daily   busPIRone  (BUSPAR ) 15 mg, Oral, Daily at bedtime   celecoxib  (CELEBREX ) 200 mg, Oral, Daily   clonazePAM  (KLONOPIN ) 1 mg, Oral, Once PRN   cyclobenzaprine  (FLEXERIL ) 5 mg, Oral, 3 times daily PRN   divalproex  (DEPAKOTE ) 500 mg, Oral, 2 times daily   EPINEPHrine  (EPI-PEN) 0.3  mg, Intramuscular, As needed   esomeprazole (NEXIUM) 40 mg, Oral, Every morning   Fluticasone -Salmeterol (ADVAIR) 500-50 MCG/DOSE AEPB 2 puffs, Inhalation, Every morning   furosemide  (LASIX ) 40 mg, Oral, Daily PRN   gabapentin  (NEURONTIN ) 300-600 mg, Oral, See admin instructions, 300 mg in the morning, 600 mg at bedtime    hydrALAZINE  (APRESOLINE ) 10 mg, Oral, 2 times daily   hydrochlorothiazide  (HYDRODIURIL ) 25 mg, Oral, Daily   lisinopril  (ZESTRIL ) 40 mg, Oral, Daily   metFORMIN  (GLUCOPHAGE ) 1,000 mg, Oral, 2 times daily with meals   montelukast  (SINGULAIR ) 10 mg, Oral, Daily at bedtime   OVER THE COUNTER MEDICATION 400 mg, Oral, Daily at bedtime, Burdock root     oxyCODONE  (ROXICODONE ) 5 mg, Oral, Every 4 hours PRN   PARoxetine  (PAXIL ) 40 mg, Oral, BH-each morning   spironolactone  (ALDACTONE ) 25 mg, Oral, Daily at bedtime   tamsulosin  (FLOMAX ) 0.4 mg, Oral, Daily at bedtime   testosterone  cypionate (DEPOTESTOSTERONE CYPIONATE) 100 mg, Intramuscular, Every 14 days   verapamil  (CALAN -SR) 120 mg, Oral, Daily   Vitamin D  (Ergocalciferol ) (DRISDOL ) 50,000 Units, Oral, Every Thu    Diet Orders (From admission, onward)     Start     Ordered   09/03/23 1219  Diet clear liquid Room service appropriate? No; Fluid consistency: Thin  Diet effective now       Question Answer Comment  Room service appropriate? No   Fluid consistency: Thin      09/03/23 1219            DVT prophylaxis: heparin  injection 5,000 Units Start: 08/14/23 2200 Place and maintain sequential compression device Start: 07/14/23 1033   Lab Results  Component Value Date   PLT 124 (L) 09/05/2023      Code Status: Full Code  Family Communication: no family at bedside   Status is: Inpatient Remains inpatient appropriate because: severity of illness  Level of care: Progressive  Consultants:  ID Nephrology Oncology  Objective: Vitals:   09/04/23 2300 09/05/23 0255 09/05/23 0500 09/05/23 0734  BP: 118/86 111/80    Pulse: 95 95    Resp: 20 19    Temp: 98 F (36.7 C) 98 F (36.7 C)  98.3 F (36.8 C)  TempSrc: Oral Axillary  Oral  SpO2: 97% 95%    Weight:   (!) 138 kg   Height:        Intake/Output Summary (Last 24 hours) at 09/05/2023 1109 Last data filed at 09/05/2023 0100 Gross per 24 hour  Intake --  Output 1600 ml  Net -1600 ml   Wt Readings from Last 3 Encounters:  09/05/23 (!) 138 kg  06/05/23 113.7 kg  01/29/23 117.9 kg    Examination:  Constitutional: NAD Eyes: lids and conjunctivae normal, no scleral icterus ENMT: mmm Neck: normal, supple Respiratory: clear to auscultation bilaterally, no wheezing, no crackles. Normal respiratory effort.   Cardiovascular: Regular rate and rhythm, no murmurs / rubs / gallops. No LE edema. Abdomen: soft, no distention, no tenderness. Bowel sounds positive.   Data Reviewed: I have independently reviewed following labs and imaging studies   CBC Recent Labs  Lab 09/01/23 0449 09/02/23 0420 09/03/23 0447 09/04/23 0412 09/05/23 0522  WBC 2.9* 2.5* 1.7* 2.7* 7.3  HGB 8.6* 8.9* 8.6* 8.6* 9.0*  HCT 25.6* 27.0* 26.1* 26.4* 27.0*  PLT 89* 91* 99* 115* 124*  MCV 100.8* 102.3* 102.8* 103.9* 103.4*  MCH 33.9 33.7 33.9 33.9 34.5*  MCHC 33.6 33.0 33.0 32.6 33.3  RDW 22.4* 22.5* 22.2*  22.5* 22.5*  LYMPHSABS 0.4* 0.4* 0.6* 0.5* 0.7  MONOABS 0.3 0.3 0.3 0.5 0.5  EOSABS 0.0 0.0 0.0 0.0 0.0  BASOSABS 0.0 0.0 0.0 0.0 0.0    Recent Labs  Lab 08/31/23 1122 09/01/23 0449 09/01/23 0450 09/02/23 0420 09/03/23 0447 09/04/23 0412 09/05/23 0522  NA 127* 127* 128* 133*  132* 134* 136 128*  K 3.4* 3.5 3.7 3.4*  3.4* 3.7 3.7 3.5  CL 90* 91* 92* 96*  96* 96* 96* 91*  CO2 24 26 25 26  25 25 29 25   GLUCOSE 159* 113* 114* 111*  113* 103* 123* 78  BUN 117* 133* 133* 71*  72* 97* 108* 100*  CREATININE 1.97* 1.89* 1.88* 1.25*  1.25* 1.58* 1.65* 1.49*  CALCIUM  9.2 10.3 10.2 9.7  9.5 10.4* 10.5* 9.8  AST 12* 11*  --  12* 13* 14*  --   ALT 11 11  --  10 8 8   --   ALKPHOS 49 51  --  65 68 74  --   BILITOT 0.6 0.7  --  0.6 0.7 0.7  --   ALBUMIN  2.1* 2.0* 2.0* 2.1*  2.1* 2.2* 2.1*  --   MG  --   --   --   --   --  2.5*  --     ------------------------------------------------------------------------------------------------------------------ No results for input(s): CHOL, HDL, LDLCALC, TRIG, CHOLHDL, LDLDIRECT in the last 72 hours.  Lab Results  Component Value Date   HGBA1C 6.0 (H) 08/14/2023   ------------------------------------------------------------------------------------------------------------------ No results for input(s): TSH, T4TOTAL, T3FREE, THYROIDAB in the last  72 hours.  Invalid input(s): FREET3  Cardiac Enzymes No results for input(s): CKMB, TROPONINI, MYOGLOBIN in the last 168 hours.  Invalid input(s): CK ------------------------------------------------------------------------------------------------------------------    Component Value Date/Time   BNP 301.2 (H) 07/23/2023 0816    CBG: Recent Labs  Lab 09/04/23 1931 09/04/23 2250 09/04/23 2357 09/05/23 0428 09/05/23 0734  GLUCAP 151* 106* 133* 83 78    No results found for this or any previous visit (from the past 240 hours).    Radiology Studies: No results found.   Kathlen Para, MD, PhD [facility]  Between 7 am - 7 pm I am available, please contact me via Amion (for emergencies) or Securechat (non urgent messages)  Between 7 pm - 7 am I am not available, please contact night coverage MD/APP via Amion

## 2023-09-05 NOTE — Progress Notes (Signed)
 Physical Therapy Treatment Patient Details Name: Frank [name]. MRN: [id number] DOB: 11-Dec-1965 Today's Date: 09/05/2023   History of Present Illness 58 y.o. male transferred from [facility] 07/03/23 to [facility] for management of newly diagnosed plasma cell myeloma with weakness and AMS. Pt also with AKI and metabolic encephalopathy. 5/10 transfer to [facility] for iHD. 4/22 Intubated and chemo initiated. 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 5/6 IVIG started. 5/8-6/11 trach. 5/12 iHD initiated. 5/29 PEG placed. 6/5 chemo initiated. PMHx:Lt THA, carpal tunnel syndrome, Lt TKA, Lt humerus fx, PAF, HTN, HLD, bipolar disorder, obesity, T2DM    PT Comments  Pt received in supine and agreeable to session. Pt noted to be incontinent of bowel and requires total A for hygiene. Pt able to roll to the R with max A for pericare and wound dressing change. Pt requires increased assist due to inability to pull on rail with LUE. Pt continues to benefit from PT services to progress toward functional mobility goals.     If plan is discharge home, recommend the following: Two people to help with walking and/or transfers;Assistance with cooking/housework;Assist for transportation;Two people to help with bathing/dressing/bathroom;Direct supervision/assist for medications management;Direct supervision/assist for financial management;Help with stairs or ramp for entrance;Assistance with feeding   Can travel by private vehicle     No  Equipment Recommendations  Hospital bed;Hoyer lift;Wheelchair (measurements PT);Wheelchair cushion (measurements PT);BSC/3in1    Recommendations for Other Services       Precautions / Restrictions Precautions Precautions: Fall Recall of Precautions/Restrictions: Impaired Precaution/Restrictions Comments: episode of syncope, sacral wound Restrictions Weight Bearing Restrictions Per Provider Order: No LUE Weight Bearing Per Provider Order: Non weight bearing Other Position/Activity  Restrictions: Lt humerus non-union, splinted     Mobility  Bed Mobility Overal bed mobility: Needs Assistance Bed Mobility: Rolling Rolling: Max assist         General bed mobility comments: Rolling to R side for hygiene tasks    Transfers                        Ambulation/Gait                   Stairs             Wheelchair Mobility     Tilt Bed    Modified Rankin (Stroke Patients Only)       Balance Overall balance assessment: Needs assistance     Sitting balance - Comments: nt this session                                    Communication Communication Communication: Impaired Factors Affecting Communication: Reduced clarity of speech  Cognition Arousal: Alert Behavior During Therapy: Flat affect   PT - Cognitive impairments: Difficult to assess Difficult to assess due to: Impaired communication                     PT - Cognition Comments: limited responses to questions Following commands: Impaired Following commands impaired: Follows one step commands with increased time    Cueing Cueing Techniques: Verbal cues  Exercises      General Comments        Pertinent Vitals/Pain Pain Assessment Pain Assessment: Faces Faces Pain Scale: Hurts even more Pain Location: Rt shoulder, sacral wound Pain Descriptors / Indicators: Grimacing, Aching, Discomfort, Guarding Pain Intervention(s): Limited activity within patient's tolerance, Monitored  during session, Repositioned     PT Goals (current goals can now be found in the care plan section) Acute Rehab PT Goals Patient Stated Goal: unable to state PT Goal Formulation: Patient unable to participate in goal setting Time For Goal Achievement: 09/06/23 Progress towards PT goals: Progressing toward goals    Frequency    Min 1X/week       AM-PAC PT 6 Clicks Mobility   Outcome Measure  Help needed turning from your back to your side while in a  flat bed without using bedrails?: Total Help needed moving from lying on your back to sitting on the side of a flat bed without using bedrails?: Total Help needed moving to and from a bed to a chair (including a wheelchair)?: Total Help needed standing up from a chair using your arms (e.g., wheelchair or bedside chair)?: Total Help needed to walk in hospital room?: Total Help needed climbing 3-5 steps with a railing? : Total 6 Click Score: 6    End of Session   Activity Tolerance: Other (comment) (bowel incontinence) Patient left: in bed;with call bell/phone within reach Nurse Communication: Mobility status PT Visit Diagnosis: Other abnormalities of gait and mobility (R26.89);Muscle weakness (generalized) (M62.81);Difficulty in walking, not elsewhere classified (R26.2);Unsteadiness on feet (R26.81)     Time: 1610-9604 PT Time Calculation (min) (ACUTE ONLY): 34 min  Charges:    $Therapeutic Activity: 23-37 mins PT General Charges $$ ACUTE PT VISIT: 1 Visit                     Michaelle Adolphus, PTA Acute Rehabilitation Services Secure Chat Preferred  [phone]    Michaelle Adolphus 09/05/2023, 1:22 PM

## 2023-09-05 NOTE — TOC Progression Note (Signed)
 Transition of Care (TOC) - Progression Note    Patient Details  Name: [name]. MRN: [id number] Date of Birth: 09-09-1965  Transition of Care [facility]) CM/SW Contact  Tandy Fam, [place] Phone Number: 09/05/2023, 10:55 AM  Clinical Narrative:   CSW spoke with Admissions at [facility] to discuss their dialysis unit at [facility]. They are unable to accommodate stretcher dialysis, patient would have to sit in chair. They would be unable to offer a bed for patient at this time. CSW to follow.    Expected Discharge Plan: Long Term Acute Care (LTAC) Barriers to Discharge: Continued Medical Work up  Expected Discharge Plan and Services In-house Referral: Clinical Social Work Discharge Planning Services: CM Consult Post Acute Care Choice: Skilled Nursing Facility Living arrangements for the past 2 months: Single Family Home                 DME Arranged: N/A DME Agency: NA       HH Arranged: NA HH Agency: NA         Social Determinants of Health (SDOH) Interventions SDOH Screenings   Food Insecurity: No Food Insecurity (07/05/2023)  Housing: Low Risk  (07/05/2023)  Transportation Needs: No Transportation Needs (07/05/2023)  Utilities: Not At Risk (07/05/2023)  Tobacco Use: High Risk (08/30/2023)    Readmission Risk Interventions     No data to display

## 2023-09-05 NOTE — Plan of Care (Signed)

## 2023-09-06 DIAGNOSIS — C9000 Multiple myeloma not having achieved remission: Secondary | ICD-10-CM | POA: Diagnosis not present

## 2023-09-06 LAB — CBC
HCT: 27.5 % — ABNORMAL LOW (ref 39.0–52.0)
Hemoglobin: 9.4 g/dL — ABNORMAL LOW (ref 13.0–17.0)
MCH: 35.3 pg — ABNORMAL HIGH (ref 26.0–34.0)
MCHC: 34.2 g/dL (ref 30.0–36.0)
MCV: 103.4 fL — ABNORMAL HIGH (ref 80.0–100.0)
Platelets: 155 10*3/uL (ref 150–400)
RBC: 2.66 MIL/uL — ABNORMAL LOW (ref 4.22–5.81)
RDW: 23.2 % — ABNORMAL HIGH (ref 11.5–15.5)
WBC: 9.3 10*3/uL (ref 4.0–10.5)
nRBC: 0 % (ref 0.0–0.2)

## 2023-09-06 LAB — GLUCOSE, CAPILLARY
Glucose-Capillary: 71 mg/dL (ref 70–99)
Glucose-Capillary: 76 mg/dL (ref 70–99)
Glucose-Capillary: 155 mg/dL — ABNORMAL HIGH (ref 70–99)
Glucose-Capillary: 165 mg/dL — ABNORMAL HIGH (ref 70–99)
Glucose-Capillary: 145 mg/dL — ABNORMAL HIGH (ref 70–99)
Glucose-Capillary: 126 mg/dL — ABNORMAL HIGH (ref 70–99)

## 2023-09-06 LAB — RENAL FUNCTION PANEL
Albumin: 2.1 g/dL — ABNORMAL LOW (ref 3.5–5.0)
Anion gap: 10 (ref 5–15)
BUN: 97 mg/dL — ABNORMAL HIGH (ref 6–20)
CO2: 27 mmol/L (ref 22–32)
Calcium: 9.8 mg/dL (ref 8.9–10.3)
Chloride: 90 mmol/L — ABNORMAL LOW (ref 98–111)
Creatinine, Ser: 1.36 mg/dL — ABNORMAL HIGH (ref 0.61–1.24)
GFR, Estimated: 60 mL/min — ABNORMAL LOW (ref >60)
Glucose, Bld: 74 mg/dL (ref 70–99)
Phosphorus: 2.8 mg/dL (ref 2.5–4.6)
Potassium: 3.3 mmol/L — ABNORMAL LOW (ref 3.5–5.1)
Sodium: 127 mmol/L — ABNORMAL LOW (ref 135–145)

## 2023-09-06 MED ORDER — DEXTROSE-SODIUM CHLORIDE 5-0.45 % IV SOLN: INTRAVENOUS | Status: AC

## 2023-09-06 MED ORDER — SODIUM CHLORIDE 0.9 % IV SOLN: INTRAVENOUS | Status: DC

## 2023-09-06 NOTE — Progress Notes (Signed)
 Patient ID: Frank [name]., male   DOB: May 25, 1965, 58 y.o.   MRN: [id number] [facility] Progress Note   Assessment/ Plan:    1. Acute kidney Injury on CKD3a: secondary to myeloma kidney. Transitioned to IHD on MWF schedule after previously being on CRRT --------------------------------  - Urine output has improved and showing signs of renal recovery.  Last dialysis on Monday  6/16    BUN continued to rise for a time and is somewhat high but mental status actually better today.  Will continue to hold dialysis but maintain dialysis catheter for now.  Reassess for dialysis needs daily.  BUN and crt down a little more today-  cont to hold HD - TDC replaced after line holiday on 6/16-  keep line in for now.   With high BUN , soft BP and emerging tachycardia will give some NS  2. Acute metabolic encephalopathy: appears to wax and wane per charting  - optimize HD as above   3. Multiple myeloma:  - Closely followed by [name] and started on aggressive regimen with Sarclisa , Kyprolis  and Cytoxan  - Appreciate oncology input  4. 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  Note plans to treat through 10/21/23 per ID note  5. Acute hypoxic respiratory failure - trach removed on 6/11 - pulm following  6. Anemia: from acute illness and multiple myeloma. ESA and transfusions per onc  7. Bacteremia - noted staph epidermidis from 6/7 blood cultures.   - Repeat blood cultures on 6/9 are NGTD   - abx per primary team and ID   8. Hypercalcemia: Corrected calcium  fairly high around 11-12. Obtain PTH. Likely related to immobility.  Myeloma may contribute.  Consider bisphosphonate-  is trending down  Hyponatremia-  now thinking hypovolemic hyponatremia-  give NS one liter and re assess       Subjective:   1200 of UOP-  bun and crt trended down again today but very slow-  he admits to thirst -  is having trouble with his clear liquid diet      Objective:   BP 111/69 (BP Location: Right Arm)   Pulse (!) 102   Temp 98 F (36.7 C) (Oral)   Resp 18   Ht 6' 0.01 (1.829 m)   Wt (!) 138 kg   SpO2 98%   BMI 41.25 kg/m   Intake/Output Summary (Last 24 hours) at 09/06/2023 1032 Last data filed at 09/06/2023 9066 Gross per 24 hour  Intake --  Output 2000 ml  Net -2000 ml   Weight change:   Physical Exam:       General adult male in bed in no acute distress HEENT normocephalic atraumatic no nasal discharge Lungs bilateral chest rise with no increased work of breathing Heart tachycardic, no rub Abdomen soft nontender nondistended Extremities min edema, warm and well-perfused Neuro, lying in bed, minimally interactive   Imaging: No results found.     Labs: BMET Recent Labs  Lab 08/30/23 1101 08/31/23 1122 09/01/23 0449 09/01/23 0450 09/02/23 0420 09/03/23 0447 09/04/23 0412 09/05/23 0522 09/06/23 0648  NA 128* 127* 127* 128* 133*  132* 134* 136 128* 127*  K 3.3* 3.4* 3.5 3.7 3.4*  3.4* 3.7 3.7 3.5 3.3*  CL 92* 90* 91* 92* 96*  96* 96* 96* 91* 90*  CO2 24 24 26 25 26  25 25 29 25 27   GLUCOSE 127* 159* 113* 114* 111*  113* 103* 123* 78 74  BUN 88*  117* 133* 133* 71*  72* 97* 108* 100* 97*  CREATININE 2.01* 1.97* 1.89* 1.88* 1.25*  1.25* 1.58* 1.65* 1.49* 1.36*  CALCIUM  8.4* 9.2 10.3 10.2 9.7  9.5 10.4* 10.5* 9.8 9.8  PHOS 3.1 3.1  --  3.9 2.9  --  3.0  --  2.8   CBC Recent Labs  Lab 09/02/23 0420 09/03/23 0447 09/04/23 0412 09/05/23 0522 09/06/23 0648  WBC 2.5* 1.7* 2.7* 7.3 9.3  NEUTROABS 1.8 0.8* 1.7 6.1  --   HGB 8.9* 8.6* 8.6* 9.0* 9.4*  HCT 27.0* 26.1* 26.4* 27.0* 27.5*  MCV 102.3* 102.8* 103.9* 103.4* 103.4*  PLT 91* 99* 115* 124* 155    Medications:     acyclovir   200 mg Per Tube BID   arformoterol   15 mcg Nebulization BID   collagenase    Topical Daily   feeding supplement (NEPRO CARB STEADY)  355 mL Per Tube TID PC & HS   feeding supplement (PROSource TF20)  60 mL Per  Tube Daily   heparin  injection (subcutaneous)  5,000 Units Subcutaneous Q8H   heparin  sodium (porcine)  10,000 Units Intravenous Once   insulin  aspart  0-15 Units Subcutaneous TID AC & HS   insulin  aspart  2 Units Subcutaneous TID AC & HS   insulin  glargine-yfgn  15 Units Subcutaneous Q24H   midodrine   10 mg Per Tube Q M,W,F-HD   montelukast   5 mg Per Tube QHS   multivitamin  1 tablet Per Tube QHS   nicotine   7 mg Transdermal Daily   nutrition supplement (JUVEN)  1 packet Per Tube BID BM   mouth rinse  15 mL Mouth Rinse 4 times per day   PARoxetine   40 mg Per Tube Daily   pneumococcal 20-valent conjugate vaccine  0.5 mL Intramuscular Tomorrow-1000   polyethylene glycol  17 g Per Tube BID   revefenacin   175 mcg Nebulization Daily   senna  1 tablet Per Tube Daily   sodium chloride  flush  10-40 mL Intracatheter Q12H   voriconazole   350 mg Per Tube Q12H    Curtis DELENA Heman, MD 09/06/2023, 10:32 AM

## 2023-09-06 NOTE — Progress Notes (Signed)
 Speech Language Pathology Treatment: Dysphagia  Patient Details Name: Frank [name]. MRN: [id number] DOB: Dec 24, 1965 Today's Date: 09/06/2023 Time: 8679-8664 SLP Time Calculation (min) (ACUTE ONLY): 15 min  Assessment / Plan / Recommendation Clinical Impression  Continued dysphagia management, nursing requested SLP visit for potential diet advancement. Pt continues to make progress with swallowing, however per chart review has demonstrated inconsistent sensation of pharyngeal and laryngeal musculature during swallowing with recommendation for repeat instrumental assessment prior to diet advancement. Xerostomia noted this date, improving post oral care by SLP. Pt eager for POs, clear liquid lunch tray present. Instructed for effortful swallows with POs. Pt exhibited intermittent delayed cough 2-3 times during session following POs. Recommend continue liquid diet with repeat instrumental assessment to assess readiness for solid advancement. Will plan for reassessment early next week as radiology scheduling allows.     HPI HPI: 58 y.o. male transferred from [facility] 07/03/23 to [facility] for management of newly diagnosed plasma cell myeloma with weakness and AMS. Pt also with AKI and metabolic encephalopathy. 5/10 transfer to [facility] for iHD.Bedside swallow eval 4/18 wiht recs for NPO due to somnolence. 4/22 Intubated and chemo initiated. 4/23-5/10 CRRT. 5/6 IVIG started. 5/8 trach. 5/12 iHD initiated. 5/18 inability to oxygenate through trach, intubated and trach revision. PMHx:Lt THA, carpal tunnel syndrome, Lt TKA, Lt humerus fx, PAF, HTN, HLD, bipolar disorder, obesity, T2DM      SLP Plan  Continue with current plan of care;MBS          Recommendations  Diet recommendations: Thin liquid Liquids provided via: Cup;Straw Medication Administration: Via alternative means Supervision: Staff to assist with self feeding;Full supervision/cueing for compensatory strategies Compensations: Slow  rate;Hard cough after swallow Postural Changes and/or Swallow Maneuvers: Seated upright 90 degrees                  Oral care BID;Oral care QID   Intermittent Supervision/Assistance Dysphagia, oropharyngeal phase (R13.12)     Continue with current plan of care;MBS     Mitzie HUNT MA, CCC-SLP Acute Rehabilitation Services    09/06/2023, 1:46 PM

## 2023-09-06 NOTE — Plan of Care (Signed)
  Problem: Education: Goal: Knowledge of General Education information will improve Description: Including pain rating scale, medication(s)/side effects and non-pharmacologic comfort measures Outcome: Progressing   Problem: Health Behavior/Discharge Planning: Goal: Ability to manage health-related needs will improve Outcome: Progressing   Problem: Clinical Measurements: Goal: Ability to maintain clinical measurements within normal limits will improve Outcome: Progressing Goal: Will remain free from infection Outcome: Progressing Goal: Diagnostic test results will improve Outcome: Progressing Goal: Respiratory complications will improve Outcome: Progressing Goal: Cardiovascular complication will be avoided Outcome: Progressing   Problem: Activity: Goal: Risk for activity intolerance will decrease Outcome: Progressing   Problem: Nutrition: Goal: Adequate nutrition will be maintained Outcome: Progressing   Problem: Coping: Goal: Level of anxiety will decrease Outcome: Progressing   Problem: Elimination: Goal: Will not experience complications related to bowel motility Outcome: Progressing Goal: Will not experience complications related to urinary retention Outcome: Progressing   Problem: Pain Managment: Goal: General experience of comfort will improve and/or be controlled Outcome: Progressing   Problem: Safety: Goal: Ability to remain free from injury will improve Outcome: Progressing   Problem: Skin Integrity: Goal: Risk for impaired skin integrity will decrease Outcome: Progressing   Problem: Education: Goal: Ability to describe self-care measures that may prevent or decrease complications (Diabetes Survival Skills Education) will improve Outcome: Progressing Goal: Individualized Educational Video(s) Outcome: Progressing   Problem: Coping: Goal: Ability to adjust to condition or change in health will improve Outcome: Progressing   Problem: Fluid  Volume: Goal: Ability to maintain a balanced intake and output will improve Outcome: Progressing   Problem: Health Behavior/Discharge Planning: Goal: Ability to identify and utilize available resources and services will improve Outcome: Progressing Goal: Ability to manage health-related needs will improve Outcome: Progressing   Problem: Metabolic: Goal: Ability to maintain appropriate glucose levels will improve Outcome: Progressing   Problem: Nutritional: Goal: Maintenance of adequate nutrition will improve Outcome: Progressing Goal: Progress toward achieving an optimal weight will improve Outcome: Progressing   Problem: Skin Integrity: Goal: Risk for impaired skin integrity will decrease Outcome: Progressing   Problem: Tissue Perfusion: Goal: Adequacy of tissue perfusion will improve Outcome: Progressing   Problem: Education: Goal: Knowledge about tracheostomy care/management will improve Outcome: Progressing   Problem: Activity: Goal: Ability to tolerate increased activity will improve Outcome: Progressing   Problem: Health Behavior/Discharge Planning: Goal: Ability to manage tracheostomy will improve Outcome: Progressing   Problem: Respiratory: Goal: Patent airway maintenance will improve Outcome: Progressing   Problem: Role Relationship: Goal: Ability to communicate will improve Outcome: Progressing   Problem: Education: Goal: Knowledge of disease and its progression will improve Outcome: Progressing Goal: Individualized Educational Video(s) Outcome: Progressing   Problem: Fluid Volume: Goal: Compliance with measures to maintain balanced fluid volume will improve Outcome: Progressing

## 2023-09-06 NOTE — Progress Notes (Signed)
 PROGRESS NOTE  Frank [name]. FMW:[id number] DOB: 01-29-1966 DOA: 07/03/2023 PCP: [name], MD   LOS: 65 days   Brief Narrative / Interim history: 58 year old PMH OSA CPAP, pathological fracture and multiple ORIF left Humerus likely due to multiple myeloma, A-fib not on anticoagulation, hyperlipidemia, tobacco use disorder who was  feeling weak for 1 week, admitted  to [facility] health with concern of sepsis, community-acquired pneumonia and acute encephalopathy.  Patient was found to have lytic lesions,  he was transferred to [facility] for  oncology treatment.  In the meantime he developed encephalopathy, acute renal failure.  He remained in the ICU for 36 days.  Underwent tracheostomy. He was also started on HD after  CRRT.  Subsequently PEG tube placed 5/29.   Significant events 4/17-Admitted to medical floor with oncology consultation to treat multiple myeloma. 4/19-transferred to ICU intubated, encephalopathic. 4/20-A-fib with RVR. Started heparin . Groin and biopsy site hematoma requiring multiple transfusions. EEG 4/25 >> moderate to severe encephalopathy without any seizures or epileptiform discharges. 4/29 Received Velcade  and Cytoxan  chemotherapy-intent palliative? 5/2 LP and ETT exchange  5/8 Trach placed. Some post op bleeding improved with thrombi-pad 5/10 Voriconazole  added back based on CT Chest findings 5/13 more alert and able to follow some commands. 5/23, transferred to floor. 5/29-PEG placed 5/31 chemotherapy changed to Sarclisa /Kyprolis /Cytoxan . 6/13 plan to remove HD cathter for holiday line 6/16 new HD catheter placed  Subjective / 24h Interval events: He was alert this morning, no complaints, no chest pain or shortness of breath.  Assesement and Plan: Principal Problem:   Multiple myeloma not having achieved remission (HCC) Active Problems:   AKI (acute kidney injury) (HCC)   Acute hypoxemic respiratory failure (HCC)   Goals of care,  counseling/discussion   Acute metabolic encephalopathy   Lobar pneumonia, unspecified organism (HCC)   Fever   Aspergillosis, unspecified (HCC)   Hematoma of right thigh   Generalized weakness   Atrial fibrillation with rapid ventricular response (HCC)   PAF (paroxysmal atrial fibrillation) (HCC)   On mechanically assisted ventilation (HCC)   Multiple myeloma without remission (HCC)   Protein-calorie malnutrition, severe   Malfunction of tracheostomy stoma (HCC)   Seizure disorder (HCC)   DMII (diabetes mellitus, type 2) (HCC)   Principal problem Recurrent fevers, sepsis, Staph Epi Bacteremia -Hospital course complicated by intermittent fevers, ID consulted and most recently evaluated patient 6/17.  He had a fever and MRSE bacteremia 1/4 bottles, currently on vancomycin  with end date today, 6/20.  He received a line holiday - Repeat blood culture 6/9 without growth - He has unstageable sacral ulcer, continue local wound care.  He is afebrile  Active problems  Aspergillosis with pneumonia -On Voriconazole  per ID, On Acyclovir .  End date for voriconazole  is 8/5   Multiple myeloma - He was  started on Velcade  and Cytoxan .  Chemotherapy changed to Sarclisa /Kyprolis /Cytoxan  on 5/31. Dr [name] following. Getting Chemo, 3 weeks on and 1 week off.  For now chemotherapy has been postponed due to low counts, hopefully next week   Goals of care - Unfortunately poor long-term prognosis. Palliative care has discussed with wife previously.  Remain full code on full scope of care   Acute Hypoxic Respiratory Failure - Pulmonology following intermittently, trach removed 6/11.  Respiratory status is stable, continue supportive care   Right Thigh Hematoma - per CT on 08/01/23: 12.1 x 30.2 x 7.6 cm subcutaneous hematoma within the anterior right thigh. 8.4 x 10.4 x 22.2 cm subcutaneous hematoma  within the right gluteal region - continue supportive care.  Hemoglobin stable   Severe acute metabolic  encephalopathy with hypoactive delirium, seizure disorder - Normal MRI of the brain.  LP negative for meningitis or encephalitis.  EEG negative for seizures but encephalopathy. Home Depakote  discontinued in ICU.   Insulin -dependent diabetes type 2 with hyperglycemia- A1c 6.0, continue with sliding scale insulin   CBG (last 3)  Recent Labs    09/05/23 2347 09/06/23 0404 09/06/23 0811  GLUCAP 127* 71 76    AKI on CKD stage IIIa, Uremia,  Hyponatremia, Hypokalemia -Due to MM, hypercalcemia and ACE inhibitors?  Nephrology consulted, now requiring HD.  HD is now on hold given stability in his numbers and urine output.  Hopefully will not need dialysis moving forward   Paroxysmal A-fib - Unable to  anticoagulation due to hematoma. Rate controlled.    Obstructive Sleep Apnea -Trach removed   Anxiety/Bipolar disorder - Continue with Paxil  and Depakote    Severe Malnutrition - Continue tube feeding.   History of Pathological fracture and multiple ORIF left Humerus 01/2023   Hypokalemia - correction with hd   Hyponatremia - Correction with HD.    Scheduled Meds:  acyclovir   200 mg Per Tube BID   arformoterol   15 mcg Nebulization BID   collagenase    Topical Daily   feeding supplement (NEPRO CARB STEADY)  355 mL Per Tube TID PC & HS   feeding supplement (PROSource TF20)  60 mL Per Tube Daily   heparin  injection (subcutaneous)  5,000 Units Subcutaneous Q8H   heparin  sodium (porcine)  10,000 Units Intravenous Once   insulin  aspart  0-15 Units Subcutaneous TID AC & HS   insulin  aspart  2 Units Subcutaneous TID AC & HS   insulin  glargine-yfgn  15 Units Subcutaneous Q24H   midodrine   10 mg Per Tube Q M,W,F-HD   montelukast   5 mg Per Tube QHS   multivitamin  1 tablet Per Tube QHS   nicotine   7 mg Transdermal Daily   nutrition supplement (JUVEN)  1 packet Per Tube BID BM   mouth rinse  15 mL Mouth Rinse 4 times per day   PARoxetine   40 mg Per Tube Daily   pneumococcal 20-valent conjugate  vaccine  0.5 mL Intramuscular Tomorrow-1000   polyethylene glycol  17 g Per Tube BID   revefenacin   175 mcg Nebulization Daily   senna  1 tablet Per Tube Daily   sodium chloride  flush  10-40 mL Intracatheter Q12H   voriconazole   350 mg Per Tube Q12H   Continuous Infusions:  sodium chloride  Stopped (08/28/23 1208)   sodium chloride  Stopped (08/28/23 0839)   sodium chloride      valproate sodium  750 mg (09/06/23 0922)   PRN Meds:.acetaminophen  (TYLENOL ) oral liquid 160 mg/5 mL **OR** acetaminophen , hydrALAZINE , hydrOXYzine , ipratropium-albuterol , ondansetron  (ZOFRAN ) IV, oxyCODONE , artificial tears, sodium chloride  flush  Current Outpatient Medications  Medication Instructions   albuterol  (PROVENTIL  HFA;VENTOLIN  HFA) 108 (90 Base) MCG/ACT inhaler 2 puffs, Inhalation, Every 6 hours PRN   albuterol  (PROVENTIL ) 2.5 mg, Every 2 hours PRN   aspirin  EC 81 mg, Oral, Daily, Swallow whole.   buPROPion (WELLBUTRIN XL) 150 mg, Daily   busPIRone  (BUSPAR ) 15 mg, Oral, Daily at bedtime   celecoxib  (CELEBREX ) 200 mg, Oral, Daily   clonazePAM  (KLONOPIN ) 1 mg, Oral, Once PRN   cyclobenzaprine  (FLEXERIL ) 5 mg, Oral, 3 times daily PRN   divalproex  (DEPAKOTE ) 500 mg, Oral, 2 times daily   EPINEPHrine  (EPI-PEN) 0.3 mg, Intramuscular, As needed  esomeprazole (NEXIUM) 40 mg, Oral, Every morning   Fluticasone -Salmeterol (ADVAIR) 500-50 MCG/DOSE AEPB 2 puffs, Inhalation, Every morning   furosemide  (LASIX ) 40 mg, Oral, Daily PRN   gabapentin  (NEURONTIN ) 300-600 mg, Oral, See admin instructions, 300 mg in the morning, 600 mg at bedtime    hydrALAZINE  (APRESOLINE ) 10 mg, Oral, 2 times daily   hydrochlorothiazide  (HYDRODIURIL ) 25 mg, Oral, Daily   lisinopril  (ZESTRIL ) 40 mg, Oral, Daily   metFORMIN  (GLUCOPHAGE ) 1,000 mg, Oral, 2 times daily with meals   montelukast  (SINGULAIR ) 10 mg, Oral, Daily at bedtime   OVER THE COUNTER MEDICATION 400 mg, Oral, Daily at bedtime, Burdock root    oxyCODONE  (ROXICODONE ) 5  mg, Oral, Every 4 hours PRN   PARoxetine  (PAXIL ) 40 mg, Oral, BH-each morning   spironolactone  (ALDACTONE ) 25 mg, Oral, Daily at bedtime   tamsulosin  (FLOMAX ) 0.4 mg, Oral, Daily at bedtime   testosterone  cypionate (DEPOTESTOSTERONE CYPIONATE) 100 mg, Intramuscular, Every 14 days   verapamil  (CALAN -SR) 120 mg, Oral, Daily   Vitamin D  (Ergocalciferol ) (DRISDOL ) 50,000 Units, Oral, Every Thu    Diet Orders (From admission, onward)     Start     Ordered   09/03/23 1219  Diet clear liquid Room service appropriate? No; Fluid consistency: Thin  Diet effective now       Question Answer Comment  Room service appropriate? No   Fluid consistency: Thin      09/03/23 1219            DVT prophylaxis: heparin  injection 5,000 Units Start: 08/14/23 2200 Place and maintain sequential compression device Start: 07/14/23 1033   Lab Results  Component Value Date   PLT 155 09/06/2023      Code Status: Full Code  Family Communication: no family at bedside   Status is: Inpatient Remains inpatient appropriate because: severity of illness  Level of care: Progressive  Consultants:  ID Nephrology Oncology  Objective: Vitals:   09/05/23 1822 09/05/23 2033 09/06/23 0403 09/06/23 0722  BP:  110/88 116/88 111/69  Pulse:  (!) 102    Resp:  17 18   Temp:   98 F (36.7 C) 98 F (36.7 C)  TempSrc:   Oral Oral  SpO2: 96% 98% 98%   Weight:      Height:        Intake/Output Summary (Last 24 hours) at 09/06/2023 1100 Last data filed at 09/06/2023 9066 Gross per 24 hour  Intake --  Output 2000 ml  Net -2000 ml   Wt Readings from Last 3 Encounters:  09/05/23 (!) 138 kg  06/05/23 113.7 kg  01/29/23 117.9 kg    Examination:  Constitutional: NAD Eyes: lids and conjunctivae normal, no scleral icterus ENMT: mmm Neck: normal, supple Respiratory: clear to auscultation bilaterally, no wheezing, no crackles. Normal respiratory effort.  Cardiovascular: Regular rate and rhythm, no murmurs  / rubs / gallops. No LE edema. Abdomen: soft, no distention, no tenderness. Bowel sounds positive.   Data Reviewed: I have independently reviewed following labs and imaging studies   CBC Recent Labs  Lab 09/01/23 0449 09/02/23 0420 09/03/23 0447 09/04/23 0412 09/05/23 0522 09/06/23 0648  WBC 2.9* 2.5* 1.7* 2.7* 7.3 9.3  HGB 8.6* 8.9* 8.6* 8.6* 9.0* 9.4*  HCT 25.6* 27.0* 26.1* 26.4* 27.0* 27.5*  PLT 89* 91* 99* 115* 124* 155  MCV 100.8* 102.3* 102.8* 103.9* 103.4* 103.4*  MCH 33.9 33.7 33.9 33.9 34.5* 35.3*  MCHC 33.6 33.0 33.0 32.6 33.3 34.2  RDW 22.4* 22.5* 22.2* 22.5* 22.5*  23.2*  LYMPHSABS 0.4* 0.4* 0.6* 0.5* 0.7  --   MONOABS 0.3 0.3 0.3 0.5 0.5  --   EOSABS 0.0 0.0 0.0 0.0 0.0  --   BASOSABS 0.0 0.0 0.0 0.0 0.0  --     Recent Labs  Lab 08/31/23 1122 09/01/23 0449 09/01/23 0450 09/02/23 0420 09/03/23 0447 09/04/23 0412 09/05/23 0522 09/06/23 0648  NA 127* 127* 128* 133*  132* 134* 136 128* 127*  K 3.4* 3.5 3.7 3.4*  3.4* 3.7 3.7 3.5 3.3*  CL 90* 91* 92* 96*  96* 96* 96* 91* 90*  CO2 24 26 25 26  25 25 29 25 27   GLUCOSE 159* 113* 114* 111*  113* 103* 123* 78 74  BUN 117* 133* 133* 71*  72* 97* 108* 100* 97*  CREATININE 1.97* 1.89* 1.88* 1.25*  1.25* 1.58* 1.65* 1.49* 1.36*  CALCIUM  9.2 10.3 10.2 9.7  9.5 10.4* 10.5* 9.8 9.8  AST 12* 11*  --  12* 13* 14*  --   --   ALT 11 11  --  10 8 8   --   --   ALKPHOS 49 51  --  65 68 74  --   --   BILITOT 0.6 0.7  --  0.6 0.7 0.7  --   --   ALBUMIN  2.1* 2.0* 2.0* 2.1*  2.1* 2.2* 2.1*  --  2.1*  MG  --   --   --   --   --  2.5*  --   --     ------------------------------------------------------------------------------------------------------------------ No results for input(s): CHOL, HDL, LDLCALC, TRIG, CHOLHDL, LDLDIRECT in the last 72 hours.  Lab Results  Component Value Date   HGBA1C 6.0 (H) 08/14/2023    ------------------------------------------------------------------------------------------------------------------ No results for input(s): TSH, T4TOTAL, T3FREE, THYROIDAB in the last 72 hours.  Invalid input(s): FREET3  Cardiac Enzymes No results for input(s): CKMB, TROPONINI, MYOGLOBIN in the last 168 hours.  Invalid input(s): CK ------------------------------------------------------------------------------------------------------------------    Component Value Date/Time   BNP 301.2 (H) 07/23/2023 0816    CBG: Recent Labs  Lab 09/05/23 1513 09/05/23 2023 09/05/23 2347 09/06/23 0404 09/06/23 0811  GLUCAP 136* 95 127* 71 76    No results found for this or any previous visit (from the past 240 hours).    Radiology Studies: No results found.   Nilda Fendt, MD, PhD [facility]  Between 7 am - 7 pm I am available, please contact me via Amion (for emergencies) or Securechat (non urgent messages)  Between 7 pm - 7 am I am not available, please contact night coverage MD/APP via Amion

## 2023-09-06 NOTE — Progress Notes (Signed)
 Pt O2 sats sustaining high 80's for approx 1 hour per monitor. Pt started on 2LNC O2. Deep breathing encouraged. Pt sitting upright. Pt denies SOB but does say he was coughing earlier while drinking. Pt lungs sound tight compared to earlier assessment. Dr. Trixie notified.

## 2023-09-06 NOTE — Progress Notes (Signed)
 I am very impressed with how well Mr. Lipson looks.  He is swallowing.  It looks like he has some clear liquid diet maybe.  Hopefully this can be further advanced.  He is able to talk better.  It was fun talking with him.  He really needs to have Orthopedic Surgery look at him and see if anything else needs to be done for his left forearm fracture.  Again the problem with getting him out of the hospital is that he needs dialysis.  He cannot sit up for dialysis.  Maybe, they will try to work on this this weekend.  We held his chemotherapy on Thursday because of his blood counts.  His blood counts have gotten a lot better.  Yesterday, his white cell count was 7.3.  Hemoglobin 9.  Platelet count 124,000.  There are no labs back yet today.  He is not complaining of any pain.  He is not have any problems with bleeding.  He has had no fever for quite a while.  Hopefully, this is a sign that the chemotherapy is helping and that the fever was secondary to his myeloma.  I know that he still is being treated for this Staph epi bacteremia.  I think he also might be treated for the fungal infection.  I am just happy that he is trying to make some good progress.  Again his last myeloma studies looked a whole lot better.  We should be able to treat him next week.  I think we just need to give him the Sarclisa .  He still getting his dialysis.  Again, hopefully his appetite and nutritional intake will be able to increase and the G-tube can be taken out of him.   Jeralyn Crease, MD  Psalm 77:14

## 2023-09-06 NOTE — Progress Notes (Signed)
 Concern by staff that patient coughing on clear liquid, so we will keep n.p.o. for now we will pending further evaluation by SLP tomorrow, will start on D5 half NS given he is n.p.o. currently and on long-acting insulin . Brayton Lye MD

## 2023-09-07 DIAGNOSIS — C9000 Multiple myeloma not having achieved remission: Secondary | ICD-10-CM | POA: Diagnosis not present

## 2023-09-07 LAB — GLUCOSE, CAPILLARY
Glucose-Capillary: 139 mg/dL — ABNORMAL HIGH (ref 70–99)
Glucose-Capillary: 98 mg/dL (ref 70–99)
Glucose-Capillary: 93 mg/dL (ref 70–99)
Glucose-Capillary: 118 mg/dL — ABNORMAL HIGH (ref 70–99)
Glucose-Capillary: 118 mg/dL — ABNORMAL HIGH (ref 70–99)
Glucose-Capillary: 117 mg/dL — ABNORMAL HIGH (ref 70–99)

## 2023-09-07 LAB — RENAL FUNCTION PANEL
Albumin: 2.1 g/dL — ABNORMAL LOW (ref 3.5–5.0)
Anion gap: 5 (ref 5–15)
BUN: 90 mg/dL — ABNORMAL HIGH (ref 6–20)
CO2: 26 mmol/L (ref 22–32)
Calcium: 9 mg/dL (ref 8.9–10.3)
Chloride: 93 mmol/L — ABNORMAL LOW (ref 98–111)
Creatinine, Ser: 1.18 mg/dL (ref 0.61–1.24)
GFR, Estimated: >60 mL/min (ref >60)
Glucose, Bld: 111 mg/dL — ABNORMAL HIGH (ref 70–99)
Phosphorus: 2.9 mg/dL (ref 2.5–4.6)
Potassium: 3.5 mmol/L (ref 3.5–5.1)
Sodium: 124 mmol/L — ABNORMAL LOW (ref 135–145)

## 2023-09-07 LAB — OSMOLALITY, URINE: Osmolality, Ur: 270 mosm/kg — ABNORMAL LOW (ref 300–900)

## 2023-09-07 MED ORDER — ACYCLOVIR 200 MG PO CAPS
400.0000 mg | ORAL_CAPSULE | Freq: Two times a day (BID) | ORAL | Status: DC
  Administered 2023-09-07 – 2023-09-11 (×9): 400 mg
  Filled 2023-09-07 (×10): qty 2

## 2023-09-07 MED ORDER — OXYCODONE HCL 5 MG PO TABS
5.0000 mg | ORAL_TABLET | Freq: Four times a day (QID) | ORAL | Status: DC | PRN
  Administered 2023-09-07 – 2023-09-11 (×10): 5 mg
  Filled 2023-09-07 (×10): qty 1

## 2023-09-07 NOTE — Progress Notes (Signed)
 Patient ID: Frank [name]., male   DOB: [birth year], 58 y.o.   MRN: [id number] [facility] Progress Note   Assessment/ Plan:    1. Acute kidney Injury on CKD3a: secondary to myeloma kidney. Transitioned to IHD on MWF schedule after previously being on CRRT --------------------------------  - Urine output has improved and showing signs of renal recovery.  Last dialysis on Monday  6/16    BUN continued to rise for a time and was somewhat high but mental status  better.  Now more clearly looking like recovery-    BUN and crt down a little more today-  look to probably remove TDC early this week  2. Acute metabolic encephalopathy: appears to wax and wane per charting  - optimize HD as above   3. Multiple myeloma:  - Closely followed by [name] and started on aggressive regimen with Sarclisa , Kyprolis  and Cytoxan  - Appreciate oncology input  4. 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  Note plans to treat through 10/21/23 per ID note  5. Acute hypoxic respiratory failure - trach removed on 6/11 - pulm following  6. Anemia: from acute illness and multiple myeloma. ESA and transfusions per onc  7. Bacteremia - noted staph epidermidis from 6/7 blood cultures.   - Repeat blood cultures on 6/9 are NGTD   - abx per primary team and ID   8. Hypercalcemia: Corrected calcium  fairly high around 11-12. Obtain PTH. Likely related to immobility.  Myeloma may contribute.  Consider bisphosphonate-  is trending down  Hyponatremia-  were thinking hypovolemic hyponatremia- but gave NS and sodium went down-  maybe has SIADH ?  Check urine osm.  I still think euvoemic to slightly dry-  no action til I see the urine osm       Subjective:   2800 of UOP-  bun and crt trended down again today  he admits still to thirst-  got a liter of fluid-  HR did go down but sodium trended down again ?     Objective:   BP (!) 137/97 (BP Location: Right Arm)    Pulse 98   Temp 97.9 F (36.6 C) (Oral)   Resp 20   Ht 6' 0.01 (1.829 m)   Wt (!) 138 kg   SpO2 93%   BMI 41.25 kg/m   Intake/Output Summary (Last 24 hours) at 09/07/2023 1109 Last data filed at 09/07/2023 1012 Gross per 24 hour  Intake 2605.52 ml  Output 2700 ml  Net -94.48 ml   Weight change:   Physical Exam:       General adult male in bed in no acute distress HEENT normocephalic atraumatic no nasal discharge Lungs bilateral chest rise with no increased work of breathing Heart tachycardic, no rub Abdomen soft nontender nondistended Extremities min edema, warm and well-perfused Neuro, lying in bed, minimally interactive   Imaging: No results found.     Labs: BMET Recent Labs  Lab 08/31/23 1122 09/01/23 0449 09/01/23 0450 09/02/23 0420 09/03/23 0447 09/04/23 0412 09/05/23 0522 09/06/23 0648 09/07/23 0509  NA 127*   < > 128* 133*  132* 134* 136 128* 127* 124*  K 3.4*   < > 3.7 3.4*  3.4* 3.7 3.7 3.5 3.3* 3.5  CL 90*   < > 92* 96*  96* 96* 96* 91* 90* 93*  CO2 24   < > 25 26  25 25 29 25 27 26   GLUCOSE 159*   < > 114*  111*  113* 103* 123* 78 74 111*  BUN 117*   < > 133* 71*  72* 97* 108* 100* 97* 90*  CREATININE 1.97*   < > 1.88* 1.25*  1.25* 1.58* 1.65* 1.49* 1.36* 1.18  CALCIUM  9.2   < > 10.2 9.7  9.5 10.4* 10.5* 9.8 9.8 9.0  PHOS 3.1  --  3.9 2.9  --  3.0  --  2.8 2.9   < > = values in this interval not displayed.   CBC Recent Labs  Lab 09/02/23 0420 09/03/23 0447 09/04/23 0412 09/05/23 0522 09/06/23 0648  WBC 2.5* 1.7* 2.7* 7.3 9.3  NEUTROABS 1.8 0.8* 1.7 6.1  --   HGB 8.9* 8.6* 8.6* 9.0* 9.4*  HCT 27.0* 26.1* 26.4* 27.0* 27.5*  MCV 102.3* 102.8* 103.9* 103.4* 103.4*  PLT 91* 99* 115* 124* 155    Medications:     acyclovir   400 mg Per Tube BID   arformoterol   15 mcg Nebulization BID   collagenase    Topical Daily   feeding supplement (NEPRO CARB STEADY)  355 mL Per Tube TID PC & HS   feeding supplement (PROSource TF20)  60  mL Per Tube Daily   heparin  injection (subcutaneous)  5,000 Units Subcutaneous Q8H   heparin  sodium (porcine)  10,000 Units Intravenous Once   insulin  aspart  0-15 Units Subcutaneous TID AC & HS   insulin  aspart  2 Units Subcutaneous TID AC & HS   insulin  glargine-yfgn  15 Units Subcutaneous Q24H   midodrine   10 mg Per Tube Q M,W,F-HD   montelukast   5 mg Per Tube QHS   multivitamin  1 tablet Per Tube QHS   nicotine   7 mg Transdermal Daily   nutrition supplement (JUVEN)  1 packet Per Tube BID BM   mouth rinse  15 mL Mouth Rinse 4 times per day   PARoxetine   40 mg Per Tube Daily   pneumococcal 20-valent conjugate vaccine  0.5 mL Intramuscular Tomorrow-1000   polyethylene glycol  17 g Per Tube BID   revefenacin   175 mcg Nebulization Daily   senna  1 tablet Per Tube Daily   sodium chloride  flush  10-40 mL Intracatheter Q12H   voriconazole   350 mg Per Tube Q12H    Curtis DELENA Heman, MD 09/07/2023, 11:09 AM

## 2023-09-07 NOTE — Plan of Care (Signed)
   Problem: Clinical Measurements: Goal: Ability to maintain clinical measurements within normal limits will improve Outcome: Progressing

## 2023-09-07 NOTE — Progress Notes (Addendum)
 Speech Language Pathology Treatment: Dysphagia  Patient Details Name: Frank [name]. MRN: [id number] DOB: 1965/04/03 Today's Date: 09/07/2023 Time: 9144-9089 SLP Time Calculation (min) (ACUTE ONLY): 15 min  Assessment / Plan / Recommendation Clinical Impression  Patient seen dysphagia management and to determine readiness for po diet after ongoing concerns for potential aspiration yesterday per nurses.    Today pt alert, but easily became fatigued during session.  He was xerostomic but had a clear voice - suspect trach site healed.    He demonstrates weak cued cough and does not like to be sat upright for po trials - but was willing.   Pt given water  via tsp, cup and straw. Demonstrates clinically timely swallow with eructation noted after 4 swallow as well as delayed cough.  SLP questions if pt may have some PES and/or esophageal deficits contributing to his coughing.    Of note, on FEES study pt did cough and did aspirate thin liquids but effectively cleared aspirates with reflexive cough - thus thin posed the least risk.  Note he also remains Afebrile with diminished lung sounds with WBC at 9.3 yesterday.     Due to concerns from staff for pt's aspiration- recommend allow thin water  readily and ice chips and conduct MBS next date  to allow re-evaluation - Pt agreeable to plan.  Prior instrumental evaluation/FEES was completed 09/03/2023 and MBS was August 25, 2023.    Asked RN to reach out to RD to have TF modified to accommodate diet change.    HPI HPI: HPI: 58 y.o. male transferred from [facility] 07/03/23 to [facility] for management of newly diagnosed plasma cell myeloma with weakness and AMS. Pt also with AKI and metabolic encephalopathy. 5/10 transfer to [facility] for iHD.Bedside swallow eval 4/18 wiht recs for NPO due to somnolence. 4/22 Intubated and chemo initiated. 4/23-5/10 CRRT. 5/6 IVIG started. 5/8 trach. 5/12 iHD initiated. 5/18 inability to oxygenate through trach, intubated and trach  revision. PMHx:Lt THA, carpal tunnel syndrome, Lt TKA, Lt humerus fx, PAF, HTN, HLD, bipolar disorder, obesity, T2DM.      SLP Plan  MBS          Recommendations  Diet recommendations: NPO;Other(comment) (THIN WATER  and ICE CHIPS) Medication Administration: Via alternative means Supervision: Patient able to self feed Compensations: Slow rate;Small sips/bites;Follow solids with liquid Postural Changes and/or Swallow Maneuvers: Seated upright 90 degrees;Upright 30-60 min after meal                        Dysphagia, oropharyngeal phase (R13.12)     MBS    Frank POUR, MS [facility] SLP Acute Rehab Services Office [phone]  Frank [name]  09/07/2023, 9:35 AM

## 2023-09-07 NOTE — Progress Notes (Signed)
 PHARMACY NOTE:  ANTIMICROBIAL RENAL DOSAGE ADJUSTMENT  Current antimicrobial regimen includes a mismatch between antimicrobial dosage and estimated renal function.  As per policy approved by the Pharmacy & Therapeutics and Medical Executive Committees, the antimicrobial dosage will be adjusted accordingly.  Current antimicrobial dosage:  Acyclovir  200 mg po bid  Indication: Viral prophylaxis in immunocompromised  Renal Function:  Estimated Creatinine Clearance: 97.1 mL/min (by C-G formula based on SCr of 1.18 mg/dL).     Antimicrobial dosage has been changed to:  Acyclovir  400 mg po bid  Thank you for allowing pharmacy to be a part of this patient's care.  Dorothyann DELENA Alert, [facility] 09/07/2023 8:24 AM

## 2023-09-07 NOTE — Progress Notes (Signed)
 PROGRESS NOTE  Frank [name]. FMW:[id number] DOB: [birth year] DOA: 07/03/2023 PCP: [name], MD   LOS: 66 days   Brief Narrative / Interim history: 58 year old PMH OSA CPAP, pathological fracture and multiple ORIF left Humerus likely due to multiple myeloma, A-fib not on anticoagulation, hyperlipidemia, tobacco use disorder who was  feeling weak for 1 week, admitted  to [facility] with concern of sepsis, community-acquired pneumonia and acute encephalopathy.  Patient was found to have lytic lesions,  he was transferred to [facility] for  oncology treatment.  In the meantime he developed encephalopathy, acute renal failure.  He remained in the ICU for 36 days.  Underwent tracheostomy. He was also started on HD after  CRRT.  Subsequently PEG tube placed 5/29.   Significant events 4/17-Admitted to medical floor with oncology consultation to treat multiple myeloma. 4/19-transferred to ICU intubated, encephalopathic. 4/20-A-fib with RVR. Started heparin . Groin and biopsy site hematoma requiring multiple transfusions. EEG 4/25 >> moderate to severe encephalopathy without any seizures or epileptiform discharges. 4/29 Received Velcade  and Cytoxan  chemotherapy-intent palliative? 5/2 LP and ETT exchange  5/8 Trach placed. Some post op bleeding improved with thrombi-pad 5/10 Voriconazole  added back based on CT Chest findings 5/13 more alert and able to follow some commands. 5/23, transferred to floor. 5/29-PEG placed 5/31 chemotherapy changed to Sarclisa /Kyprolis /Cytoxan . 6/13 plan to remove HD cathter for holiday line 6/16 new HD catheter placed  Subjective / 24h Interval events: Alert today, no chest pain, no shortness of breath, no complaints  Assesement and Plan: Principal Problem:   Multiple myeloma not having achieved remission (HCC) Active Problems:   AKI (acute kidney injury) (HCC)   Acute hypoxemic respiratory failure (HCC)   Goals of care, counseling/discussion   Acute  metabolic encephalopathy   Lobar pneumonia, unspecified organism (HCC)   Fever   Aspergillosis, unspecified (HCC)   Hematoma of right thigh   Generalized weakness   Atrial fibrillation with rapid ventricular response (HCC)   PAF (paroxysmal atrial fibrillation) (HCC)   On mechanically assisted ventilation (HCC)   Multiple myeloma without remission (HCC)   Protein-calorie malnutrition, severe   Malfunction of tracheostomy stoma (HCC)   Seizure disorder (HCC)   DMII (diabetes mellitus, type 2) (HCC)   Principal problem Recurrent fevers, sepsis, Staph Epi Bacteremia -Hospital course complicated by intermittent fevers, ID consulted and most recently evaluated patient 6/17.  He had a fever and MRSE bacteremia 1/4 bottles, currently on vancomycin  with end date today, 6/20.  He received a line holiday - Repeat blood culture 6/9 without growth - He has unstageable sacral ulcer, continue local wound care.  Remains afebrile  Active problems  Aspergillosis with pneumonia -On Voriconazole  per ID, On Acyclovir .  End date for voriconazole  is 8/5   Multiple myeloma - He was  started on Velcade  and Cytoxan .  Chemotherapy changed to Sarclisa /Kyprolis /Cytoxan  on 5/31. Dr [name] following. Getting Chemo, 3 weeks on and 1 week off.  For now chemotherapy has been postponed due to low counts, hopefully next week   Goals of care - Unfortunately poor long-term prognosis. Palliative care has discussed with wife previously.  Remain full code on full scope of care   Acute Hypoxic Respiratory Failure - Pulmonology following intermittently, trach removed 6/11.  Respiratory status is stable, on minimal oxygen today, continue supportive care   Right Thigh Hematoma - per CT on 08/01/23: 12.1 x 30.2 x 7.6 cm subcutaneous hematoma within the anterior right thigh. 8.4 x 10.4 x 22.2 cm subcutaneous hematoma  within the right gluteal region - continue supportive care.  Hemoglobin stable   Severe acute metabolic  encephalopathy with hypoactive delirium, seizure disorder - Normal MRI of the brain.  LP negative for meningitis or encephalitis.  EEG negative for seizures but encephalopathy. Home Depakote  discontinued in ICU.   Insulin -dependent diabetes type 2 with hyperglycemia- A1c 6.0, continue with sliding scale insulin   CBG (last 3)  Recent Labs    09/06/23 2310 09/07/23 0354 09/07/23 0726  GLUCAP 126* 139* 98    AKI on CKD stage IIIa, Uremia,  Hyponatremia, Hypokalemia -Due to MM, hypercalcemia and ACE inhibitors?  Nephrology consulted, now requiring HD.  HD is now on hold given stability in his numbers and urine output.  Hopefully will not need dialysis moving forward   Paroxysmal A-fib - Unable to  anticoagulation due to hematoma. Rate controlled.    Obstructive Sleep Apnea -Trach removed   Anxiety/Bipolar disorder - Continue with Paxil  and Depakote    Severe Malnutrition - Continue tube feeding.  Was advanced last week to dysphagia diet, but there is concern for aspiration and now he is n.p.o. again.  Speech following, MBS pending   History of Pathological fracture and multiple ORIF left Humerus 01/2023   Hypokalemia -potassium stable   Hyponatremia -potassium stable    Scheduled Meds:  acyclovir   400 mg Per Tube BID   arformoterol   15 mcg Nebulization BID   collagenase    Topical Daily   feeding supplement (NEPRO CARB STEADY)  355 mL Per Tube TID PC & HS   feeding supplement (PROSource TF20)  60 mL Per Tube Daily   heparin  injection (subcutaneous)  5,000 Units Subcutaneous Q8H   heparin  sodium (porcine)  10,000 Units Intravenous Once   insulin  aspart  0-15 Units Subcutaneous TID AC & HS   insulin  aspart  2 Units Subcutaneous TID AC & HS   insulin  glargine-yfgn  15 Units Subcutaneous Q24H   midodrine   10 mg Per Tube Q M,W,F-HD   montelukast   5 mg Per Tube QHS   multivitamin  1 tablet Per Tube QHS   nicotine   7 mg Transdermal Daily   nutrition supplement (JUVEN)  1 packet Per  Tube BID BM   mouth rinse  15 mL Mouth Rinse 4 times per day   PARoxetine   40 mg Per Tube Daily   pneumococcal 20-valent conjugate vaccine  0.5 mL Intramuscular Tomorrow-1000   polyethylene glycol  17 g Per Tube BID   revefenacin   175 mcg Nebulization Daily   senna  1 tablet Per Tube Daily   sodium chloride  flush  10-40 mL Intracatheter Q12H   voriconazole   350 mg Per Tube Q12H   Continuous Infusions:  sodium chloride  Stopped (08/28/23 1208)   sodium chloride  Stopped (08/28/23 0839)   sodium chloride  150 mL/hr at 09/06/23 1749   dextrose  5 % and 0.45 % NaCl 40 mL/hr at 09/06/23 2312   valproate sodium  750 mg (09/06/23 2252)   PRN Meds:.acetaminophen  (TYLENOL ) oral liquid 160 mg/5 mL **OR** acetaminophen , hydrALAZINE , hydrOXYzine , ipratropium-albuterol , ondansetron  (ZOFRAN ) IV, oxyCODONE , artificial tears, sodium chloride  flush  Current Outpatient Medications  Medication Instructions   albuterol  (PROVENTIL  HFA;VENTOLIN  HFA) 108 (90 Base) MCG/ACT inhaler 2 puffs, Inhalation, Every 6 hours PRN   albuterol  (PROVENTIL ) 2.5 mg, Every 2 hours PRN   aspirin  EC 81 mg, Oral, Daily, Swallow whole.   buPROPion (WELLBUTRIN XL) 150 mg, Daily   busPIRone  (BUSPAR ) 15 mg, Oral, Daily at bedtime   celecoxib  (CELEBREX ) 200 mg, Oral, Daily  clonazePAM  (KLONOPIN ) 1 mg, Oral, Once PRN   cyclobenzaprine  (FLEXERIL ) 5 mg, Oral, 3 times daily PRN   divalproex  (DEPAKOTE ) 500 mg, Oral, 2 times daily   EPINEPHrine  (EPI-PEN) 0.3 mg, Intramuscular, As needed   esomeprazole (NEXIUM) 40 mg, Oral, Every morning   Fluticasone -Salmeterol (ADVAIR) 500-50 MCG/DOSE AEPB 2 puffs, Inhalation, Every morning   furosemide  (LASIX ) 40 mg, Oral, Daily PRN   gabapentin  (NEURONTIN ) 300-600 mg, Oral, See admin instructions, 300 mg in the morning, 600 mg at bedtime    hydrALAZINE  (APRESOLINE ) 10 mg, Oral, 2 times daily   hydrochlorothiazide  (HYDRODIURIL ) 25 mg, Oral, Daily   lisinopril  (ZESTRIL ) 40 mg, Oral, Daily   metFORMIN   (GLUCOPHAGE ) 1,000 mg, Oral, 2 times daily with meals   montelukast  (SINGULAIR ) 10 mg, Oral, Daily at bedtime   OVER THE COUNTER MEDICATION 400 mg, Oral, Daily at bedtime, Burdock root    oxyCODONE  (ROXICODONE ) 5 mg, Oral, Every 4 hours PRN   PARoxetine  (PAXIL ) 40 mg, Oral, BH-each morning   spironolactone  (ALDACTONE ) 25 mg, Oral, Daily at bedtime   tamsulosin  (FLOMAX ) 0.4 mg, Oral, Daily at bedtime   testosterone  cypionate (DEPOTESTOSTERONE CYPIONATE) 100 mg, Intramuscular, Every 14 days   verapamil  (CALAN -SR) 120 mg, Oral, Daily   Vitamin D  (Ergocalciferol ) (DRISDOL ) 50,000 Units, Oral, Every Thu    Diet Orders (From admission, onward)     Start     Ordered   09/06/23 2049  Diet NPO time specified  Diet effective now        09/06/23 2049            DVT prophylaxis: heparin  injection 5,000 Units Start: 08/14/23 2200 Place and maintain sequential compression device Start: 07/14/23 1033   Lab Results  Component Value Date   PLT 155 09/06/2023      Code Status: Full Code  Family Communication: no family at bedside   Status is: Inpatient Remains inpatient appropriate because: severity of illness  Level of care: Progressive  Consultants:  ID Nephrology Oncology  Objective: Vitals:   09/07/23 0353 09/07/23 0727 09/07/23 0807 09/07/23 0808  BP: (!) 137/97     Pulse: 98     Resp: 20     Temp: 98.2 F (36.8 C) 97.9 F (36.6 C)    TempSrc: Oral Oral    SpO2: 93%  94% 94%  Weight:      Height:        Intake/Output Summary (Last 24 hours) at 09/07/2023 1009 Last data filed at 09/07/2023 0356 Gross per 24 hour  Intake 1950.52 ml  Output 2050 ml  Net -99.48 ml   Wt Readings from Last 3 Encounters:  09/05/23 (!) 138 kg  06/05/23 113.7 kg  01/29/23 117.9 kg    Examination:  Constitutional: NAD Eyes: lids and conjunctivae normal, no scleral icterus ENMT: mmm Neck: normal, supple Respiratory: clear to auscultation bilaterally, no wheezing, no crackles.   Cardiovascular: Regular rate and rhythm, no murmurs / rubs / gallops. No LE edema. Abdomen: soft, no distention, no tenderness. Bowel sounds positive.   Data Reviewed: I have independently reviewed following labs and imaging studies   CBC Recent Labs  Lab 09/01/23 0449 09/02/23 0420 09/03/23 0447 09/04/23 0412 09/05/23 0522 09/06/23 0648  WBC 2.9* 2.5* 1.7* 2.7* 7.3 9.3  HGB 8.6* 8.9* 8.6* 8.6* 9.0* 9.4*  HCT 25.6* 27.0* 26.1* 26.4* 27.0* 27.5*  PLT 89* 91* 99* 115* 124* 155  MCV 100.8* 102.3* 102.8* 103.9* 103.4* 103.4*  MCH 33.9 33.7 33.9 33.9 34.5* 35.3*  MCHC 33.6 33.0 33.0 32.6 33.3 34.2  RDW 22.4* 22.5* 22.2* 22.5* 22.5* 23.2*  LYMPHSABS 0.4* 0.4* 0.6* 0.5* 0.7  --   MONOABS 0.3 0.3 0.3 0.5 0.5  --   EOSABS 0.0 0.0 0.0 0.0 0.0  --   BASOSABS 0.0 0.0 0.0 0.0 0.0  --     Recent Labs  Lab 08/31/23 1122 09/01/23 0449 09/01/23 0450 09/02/23 0420 09/03/23 0447 09/04/23 0412 09/05/23 0522 09/06/23 0648 09/07/23 0509  NA 127* 127*   < > 133*  132* 134* 136 128* 127* 124*  K 3.4* 3.5   < > 3.4*  3.4* 3.7 3.7 3.5 3.3* 3.5  CL 90* 91*   < > 96*  96* 96* 96* 91* 90* 93*  CO2 24 26   < > 26  25 25 29 25 27 26   GLUCOSE 159* 113*   < > 111*  113* 103* 123* 78 74 111*  BUN 117* 133*   < > 71*  72* 97* 108* 100* 97* 90*  CREATININE 1.97* 1.89*   < > 1.25*  1.25* 1.58* 1.65* 1.49* 1.36* 1.18  CALCIUM  9.2 10.3   < > 9.7  9.5 10.4* 10.5* 9.8 9.8 9.0  AST 12* 11*  --  12* 13* 14*  --   --   --   ALT 11 11  --  10 8 8   --   --   --   ALKPHOS 49 51  --  65 68 74  --   --   --   BILITOT 0.6 0.7  --  0.6 0.7 0.7  --   --   --   ALBUMIN  2.1* 2.0*   < > 2.1*  2.1* 2.2* 2.1*  --  2.1* 2.1*  MG  --   --   --   --   --  2.5*  --   --   --    < > = values in this interval not displayed.    ------------------------------------------------------------------------------------------------------------------ No results for input(s): CHOL, HDL, LDLCALC, TRIG, CHOLHDL,  LDLDIRECT in the last 72 hours.  Lab Results  Component Value Date   HGBA1C 6.0 (H) 08/14/2023   ------------------------------------------------------------------------------------------------------------------ No results for input(s): TSH, T4TOTAL, T3FREE, THYROIDAB in the last 72 hours.  Invalid input(s): FREET3  Cardiac Enzymes No results for input(s): CKMB, TROPONINI, MYOGLOBIN in the last 168 hours.  Invalid input(s): CK ------------------------------------------------------------------------------------------------------------------    Component Value Date/Time   BNP 301.2 (H) 07/23/2023 0816    CBG: Recent Labs  Lab 09/06/23 1548 09/06/23 2041 09/06/23 2310 09/07/23 0354 09/07/23 0726  GLUCAP 165* 145* 126* 139* 98    No results found for this or any previous visit (from the past 240 hours).    Radiology Studies: No results found.   Nilda Fendt, MD, PhD [facility]  Between 7 am - 7 pm I am available, please contact me via Amion (for emergencies) or Securechat (non urgent messages)  Between 7 pm - 7 am I am not available, please contact night coverage MD/APP via Amion

## 2023-09-08 ENCOUNTER — Inpatient Hospital Stay (HOSPITAL_COMMUNITY)

## 2023-09-08 DIAGNOSIS — C9000 Multiple myeloma not having achieved remission: Secondary | ICD-10-CM | POA: Diagnosis not present

## 2023-09-08 HISTORY — PX: IR REMOVAL TUN CV CATH W/O FL: IMG2289

## 2023-09-08 LAB — RENAL FUNCTION PANEL
Albumin: 1.9 g/dL — ABNORMAL LOW (ref 3.5–5.0)
Anion gap: 8 (ref 5–15)
BUN: 75 mg/dL — ABNORMAL HIGH (ref 6–20)
CO2: 27 mmol/L (ref 22–32)
Calcium: 9.7 mg/dL (ref 8.9–10.3)
Chloride: 95 mmol/L — ABNORMAL LOW (ref 98–111)
Creatinine, Ser: 1.05 mg/dL (ref 0.61–1.24)
GFR, Estimated: >60 mL/min (ref >60)
Glucose, Bld: 106 mg/dL — ABNORMAL HIGH (ref 70–99)
Phosphorus: 3.5 mg/dL (ref 2.5–4.6)
Potassium: 3 mmol/L — ABNORMAL LOW (ref 3.5–5.1)
Sodium: 130 mmol/L — ABNORMAL LOW (ref 135–145)

## 2023-09-08 LAB — GLUCOSE, CAPILLARY
Glucose-Capillary: 104 mg/dL — ABNORMAL HIGH (ref 70–99)
Glucose-Capillary: 116 mg/dL — ABNORMAL HIGH (ref 70–99)
Glucose-Capillary: 136 mg/dL — ABNORMAL HIGH (ref 70–99)
Glucose-Capillary: 130 mg/dL — ABNORMAL HIGH (ref 70–99)
Glucose-Capillary: 205 mg/dL — ABNORMAL HIGH (ref 70–99)

## 2023-09-08 MED ORDER — POTASSIUM CHLORIDE 20 MEQ PO PACK
40.0000 meq | PACK | Freq: Once | ORAL | Status: AC
  Administered 2023-09-08: 40 meq
  Filled 2023-09-08: qty 2

## 2023-09-08 MED ORDER — POTASSIUM CHLORIDE 20 MEQ PO PACK: 60.0000 meq | PACK | Freq: Once | ORAL | Status: DC

## 2023-09-08 MED ORDER — FENTANYL CITRATE PF 50 MCG/ML IJ SOSY
25.0000 ug | PREFILLED_SYRINGE | INTRAMUSCULAR | Status: DC | PRN
  Administered 2023-09-08 (×3): 25 ug via INTRAVENOUS
  Filled 2023-09-08 (×3): qty 1

## 2023-09-08 MED ORDER — MORPHINE SULFATE (PF) 2 MG/ML IV SOLN: 2.0000 mg | INTRAVENOUS | Status: DC | PRN

## 2023-09-08 MED ORDER — MORPHINE SULFATE (PF) 2 MG/ML IV SOLN
INTRAVENOUS | Status: AC
  Filled 2023-09-08: qty 1

## 2023-09-08 MED ORDER — POTASSIUM CHLORIDE CRYS ER 20 MEQ PO TBCR: 40.0000 meq | EXTENDED_RELEASE_TABLET | Freq: Once | ORAL | Status: DC

## 2023-09-08 NOTE — Evaluation (Signed)
 Modified Barium Swallow Study  Patient Details  Name: Frank [name]. MRN: [id number] Date of Birth: [birth year]  Today's Date: 09/08/2023  Modified Barium Swallow completed.  Full report located under Chart Review in the Imaging Section.  History of Present Illness HPI: 58 y.o. male transferred from [facility] 07/03/23 to [facility] for management of newly diagnosed plasma cell myeloma with weakness and AMS. Pt also with AKI and metabolic encephalopathy. 5/10 transfer to [facility] for iHD.Bedside swallow eval 4/18 wiht recs for NPO due to somnolence. 4/22 Intubated and chemo initiated. 4/23-5/10 CRRT. 5/6 IVIG started. 5/8 trach. 5/12 iHD initiated. 5/18 inability to oxygenate through trach, intubated and trach revision. PMHx:Lt THA, carpal tunnel syndrome, Lt TKA, Lt humerus fx, PAF, HTN, HLD, bipolar disorder, obesity, T2DM.   Clinical Impression Patient participated in severely limited MBS this date with max encouragement, demonstrating visible pain and discomfort throughout entire duration though agreeable to singular swallow of thin liquids. During this swallow, visibility was limited due to shoulder positioning, thus unable to fully visualize the airway below the vocal folds. Despite positioning and visibility difficulty, suspect material from single sip was merely penetrated with penetrated materials reflexively leaving the laryngeal vestibule with completion of the swallow. No cough appreciated, which in this case, given results of FEES completed 6/18, may indicate no aspiration. Patient demanded to cease swallow study, refusing any further attempts. Provided education re: inability to visualize swallowing with any solids resulting in inability to recommend any changes to current diet; recommend reinitiation of clear liquids as recommended after FEES 6/18. Will reattempt MBS in more optimal positioning for patient comfort at next available opportuniity. Factors that may increase risk of adverse event  in presence of aspiration Noe & Lianne 2021): Reduced cognitive function;Frail or deconditioned;Dependence for feeding and/or oral hygiene;Inadequate oral hygiene;Limited mobility;Poor general health and/or compromised immunity  Swallow Evaluation Recommendations Recommendations: PO diet PO Diet Recommendation: Clear liquid diet Liquid Administration via: Straw;Cup Medication Administration: Via alternative means Supervision: Full supervision/cueing for swallowing strategies Swallowing strategies  : Hard cough after swallowing Postural changes: Partially reclined for meals (due to comfort) Oral care recommendations: Oral care before ice chips/water   Rosina Downy, M.A., CCC-SLP  Brycen Bean A Staci Carver 09/08/2023,1:13 PM

## 2023-09-08 NOTE — Progress Notes (Signed)
 This chaplain responded to the unit consult for spiritual care. The chaplain understands the Pt. wife requested a chaplain presence due to the Pt. feeling unmotivated and depressed.   The chaplain introduced herself to the Pt. The chaplain understands the Pt. prefers a visit on Tuesday. A revisit is planned.  Chaplain Leeroy Hummer [phone]

## 2023-09-08 NOTE — Progress Notes (Signed)
 PROGRESS NOTE  Frank [name]. FMW:[id number] DOB: 08-16-1965 DOA: 07/03/2023 PCP: [name], MD   LOS: 67 days   Brief Narrative / Interim history: 58 year old PMH OSA CPAP, pathological fracture and multiple ORIF left Humerus likely due to multiple myeloma, A-fib not on anticoagulation, hyperlipidemia, tobacco use disorder who was  feeling weak for 1 week, admitted  to [facility] health with concern of sepsis, community-acquired pneumonia and acute encephalopathy.  Patient was found to have lytic lesions,  he was transferred to [facility] for  oncology treatment.  In the meantime he developed encephalopathy, acute renal failure.  He remained in the ICU for 36 days.  Underwent tracheostomy. He was also started on HD after  CRRT.  Subsequently PEG tube placed 5/29.   Significant events 4/17-Admitted to medical floor with oncology consultation to treat multiple myeloma. 4/19-transferred to ICU intubated, encephalopathic. 4/20-A-fib with RVR. Started heparin . Groin and biopsy site hematoma requiring multiple transfusions. EEG 4/25 >> moderate to severe encephalopathy without any seizures or epileptiform discharges. 4/29 Received Velcade  and Cytoxan  chemotherapy-intent palliative? 5/2 LP and ETT exchange  5/8 Trach placed. Some post op bleeding improved with thrombi-pad 5/10 Voriconazole  added back based on CT Chest findings 5/13 more alert and able to follow some commands. 5/23, transferred to floor. 5/29-PEG placed 5/31 chemotherapy changed to Sarclisa /Kyprolis /Cytoxan . 6/13 plan to remove HD cathter for holiday line 6/16 new HD catheter placed 6/23 has not required HD in the past week, remove HD catheter per nephrology  Subjective / 24h Interval events: Complains of being thirsty and hungry.  Assesement and Plan: Principal Problem:   Multiple myeloma not having achieved remission (HCC) Active Problems:   AKI (acute kidney injury) (HCC)   Acute hypoxemic respiratory failure  (HCC)   Goals of care, counseling/discussion   Acute metabolic encephalopathy   Lobar pneumonia, unspecified organism (HCC)   Fever   Aspergillosis, unspecified (HCC)   Hematoma of right thigh   Generalized weakness   Atrial fibrillation with rapid ventricular response (HCC)   PAF (paroxysmal atrial fibrillation) (HCC)   On mechanically assisted ventilation (HCC)   Multiple myeloma without remission (HCC)   Protein-calorie malnutrition, severe   Malfunction of tracheostomy stoma (HCC)   Seizure disorder (HCC)   DMII (diabetes mellitus, type 2) (HCC)   Principal problem Recurrent fevers, sepsis, Staph Epi Bacteremia -Hospital course complicated by intermittent fevers, ID consulted and most recently evaluated patient 6/17.  He had a fever and MRSE bacteremia 1/4 bottles, currently on vancomycin  with end date today, 6/20.  He received a line holiday - Repeat blood culture 6/9 without growth - He has unstageable sacral ulcer, continue local wound care.  Remains afebrile  Active problems  Aspergillosis with pneumonia -On Voriconazole  per ID, On Acyclovir .  End date for voriconazole  is 8/5   Multiple myeloma - He was  started on Velcade  and Cytoxan .  Chemotherapy changed to Sarclisa /Kyprolis /Cytoxan  on 5/31. Dr [name] following. Getting Chemo, 3 weeks on and 1 week off.  For now chemotherapy has been postponed due to low counts, he will get another round of chemo this Thursday   Goals of care - Unfortunately poor long-term prognosis. Palliative care has discussed with wife previously.  Remain full code on full scope of care   Acute Hypoxic Respiratory Failure - Pulmonology following intermittently, trach removed 6/11.  Respiratory status is stable, on minimal oxygen today, continue supportive care   Right Thigh Hematoma - per CT on 08/01/23: 12.1 x 30.2 x 7.6 cm  subcutaneous hematoma within the anterior right thigh. 8.4 x 10.4 x 22.2 cm subcutaneous hematoma within the right gluteal region  - continue supportive care.  Hemoglobin stable   Severe acute metabolic encephalopathy with hypoactive delirium, seizure disorder - Normal MRI of the brain.  LP negative for meningitis or encephalitis.  EEG negative for seizures but encephalopathy. Home Depakote  discontinued in ICU.   Insulin -dependent diabetes type 2 with hyperglycemia- A1c 6.0, continue with sliding scale insulin   CBG (last 3)  Recent Labs    09/07/23 2108 09/08/23 0630 09/08/23 0821  GLUCAP 117* 104* 116*    AKI on CKD stage IIIa, Uremia,  Hyponatremia, Hypokalemia -Due to MM, hypercalcemia and ACE inhibitors?  Nephrology consulted, now requiring HD.  HD is now on hold given stability in his numbers and urine output.  Appears that he is tolerating being off dialysis, will remove tunneled HD cath today per nephrology   Paroxysmal A-fib - Unable to  anticoagulation due to hematoma. Rate controlled.    Obstructive Sleep Apnea -Trach removed   Anxiety/Bipolar disorder - Continue with Paxil  and Depakote    Severe Malnutrition - Continue tube feeding.  He is allowed clear liquid per speech   History of Pathological fracture and multiple ORIF left Humerus 01/2023   Hypokalemia -potassium stable   Hyponatremia -potassium stable    Scheduled Meds:  acyclovir   400 mg Per Tube BID   arformoterol   15 mcg Nebulization BID   collagenase    Topical Daily   feeding supplement (NEPRO CARB STEADY)  355 mL Per Tube TID PC & HS   feeding supplement (PROSource TF20)  60 mL Per Tube Daily   heparin  injection (subcutaneous)  5,000 Units Subcutaneous Q8H   heparin  sodium (porcine)  10,000 Units Intravenous Once   insulin  aspart  0-15 Units Subcutaneous TID AC & HS   insulin  aspart  2 Units Subcutaneous TID AC & HS   insulin  glargine-yfgn  15 Units Subcutaneous Q24H   midodrine   10 mg Per Tube Q M,W,F-HD   montelukast   5 mg Per Tube QHS   multivitamin  1 tablet Per Tube QHS   nicotine   7 mg Transdermal Daily   nutrition  supplement (JUVEN)  1 packet Per Tube BID BM   mouth rinse  15 mL Mouth Rinse 4 times per day   PARoxetine   40 mg Per Tube Daily   pneumococcal 20-valent conjugate vaccine  0.5 mL Intramuscular Tomorrow-1000   polyethylene glycol  17 g Per Tube BID   revefenacin   175 mcg Nebulization Daily   senna  1 tablet Per Tube Daily   sodium chloride  flush  10-40 mL Intracatheter Q12H   voriconazole   350 mg Per Tube Q12H   Continuous Infusions:  sodium chloride  Stopped (08/28/23 1208)   sodium chloride  Stopped (08/28/23 0839)   sodium chloride  Stopped (09/08/23 0314)   valproate sodium  750 mg (09/07/23 2125)   PRN Meds:.acetaminophen  (TYLENOL ) oral liquid 160 mg/5 mL **OR** acetaminophen , fentaNYL  (SUBLIMAZE ) injection, hydrALAZINE , hydrOXYzine , ipratropium-albuterol , ondansetron  (ZOFRAN ) IV, oxyCODONE , artificial tears, sodium chloride  flush  Current Outpatient Medications  Medication Instructions   albuterol  (PROVENTIL  HFA;VENTOLIN  HFA) 108 (90 Base) MCG/ACT inhaler 2 puffs, Inhalation, Every 6 hours PRN   albuterol  (PROVENTIL ) 2.5 mg, Every 2 hours PRN   aspirin  EC 81 mg, Oral, Daily, Swallow whole.   buPROPion (WELLBUTRIN XL) 150 mg, Daily   busPIRone  (BUSPAR ) 15 mg, Oral, Daily at bedtime   celecoxib  (CELEBREX ) 200 mg, Oral, Daily   clonazePAM  (KLONOPIN ) 1 mg, Oral,  Once PRN   cyclobenzaprine  (FLEXERIL ) 5 mg, Oral, 3 times daily PRN   divalproex  (DEPAKOTE ) 500 mg, Oral, 2 times daily   EPINEPHrine  (EPI-PEN) 0.3 mg, Intramuscular, As needed   esomeprazole (NEXIUM) 40 mg, Oral, Every morning   Fluticasone -Salmeterol (ADVAIR) 500-50 MCG/DOSE AEPB 2 puffs, Inhalation, Every morning   furosemide  (LASIX ) 40 mg, Oral, Daily PRN   gabapentin  (NEURONTIN ) 300-600 mg, Oral, See admin instructions, 300 mg in the morning, 600 mg at bedtime    hydrALAZINE  (APRESOLINE ) 10 mg, Oral, 2 times daily   hydrochlorothiazide  (HYDRODIURIL ) 25 mg, Oral, Daily   lisinopril  (ZESTRIL ) 40 mg, Oral, Daily    metFORMIN  (GLUCOPHAGE ) 1,000 mg, Oral, 2 times daily with meals   montelukast  (SINGULAIR ) 10 mg, Oral, Daily at bedtime   OVER THE COUNTER MEDICATION 400 mg, Oral, Daily at bedtime, Burdock root    oxyCODONE  (ROXICODONE ) 5 mg, Oral, Every 4 hours PRN   PARoxetine  (PAXIL ) 40 mg, Oral, BH-each morning   spironolactone  (ALDACTONE ) 25 mg, Oral, Daily at bedtime   tamsulosin  (FLOMAX ) 0.4 mg, Oral, Daily at bedtime   testosterone  cypionate (DEPOTESTOSTERONE CYPIONATE) 100 mg, Intramuscular, Every 14 days   verapamil  (CALAN -SR) 120 mg, Oral, Daily   Vitamin D  (Ergocalciferol ) (DRISDOL ) 50,000 Units, Oral, Every Thu    Diet Orders (From admission, onward)     Start     Ordered   09/08/23 0914  Diet clear liquid Room service appropriate? Yes; Fluid consistency: Thin  Diet effective now       Comments: Will intermittently cough, this is okay.  Question Answer Comment  Room service appropriate? Yes   Fluid consistency: Thin      09/08/23 0914            DVT prophylaxis: heparin  injection 5,000 Units Start: 08/14/23 2200 Place and maintain sequential compression device Start: 07/14/23 1033   Lab Results  Component Value Date   PLT 155 09/06/2023      Code Status: Full Code  Family Communication: no family at bedside   Status is: Inpatient Remains inpatient appropriate because: severity of illness  Level of care: Progressive  Consultants:  ID Nephrology Oncology  Objective: Vitals:   09/07/23 2327 09/08/23 0333 09/08/23 0432 09/08/23 0808  BP: (!) 127/95  124/87 (!) 136/97  Pulse: 92 92 92   Resp: 17 18 19    Temp: 97.7 F (36.5 C) 98.5 F (36.9 C)  98.5 F (36.9 C)  TempSrc: Axillary Axillary    SpO2: 94%  92%   Weight:      Height:        Intake/Output Summary (Last 24 hours) at 09/08/2023 1003 Last data filed at 09/08/2023 0411 Gross per 24 hour  Intake 4905.17 ml  Output 2900 ml  Net 2005.17 ml   Wt Readings from Last 3 Encounters:  09/05/23 (!) 138 kg   06/05/23 113.7 kg  01/29/23 117.9 kg    Examination:  Constitutional: NAD Eyes: lids and conjunctivae normal, no scleral icterus ENMT: mmm Neck: normal, supple Respiratory: clear to auscultation bilaterally, no wheezing, no crackles.  Cardiovascular: Regular rate and rhythm, no murmurs / rubs / gallops. No LE edema. Abdomen: soft, no distention, no tenderness. Bowel sounds positive.    Data Reviewed: I have independently reviewed following labs and imaging studies   CBC Recent Labs  Lab 09/02/23 0420 09/03/23 0447 09/04/23 0412 09/05/23 0522 09/06/23 0648  WBC 2.5* 1.7* 2.7* 7.3 9.3  HGB 8.9* 8.6* 8.6* 9.0* 9.4*  HCT 27.0* 26.1* 26.4* 27.0*  27.5*  PLT 91* 99* 115* 124* 155  MCV 102.3* 102.8* 103.9* 103.4* 103.4*  MCH 33.7 33.9 33.9 34.5* 35.3*  MCHC 33.0 33.0 32.6 33.3 34.2  RDW 22.5* 22.2* 22.5* 22.5* 23.2*  LYMPHSABS 0.4* 0.6* 0.5* 0.7  --   MONOABS 0.3 0.3 0.5 0.5  --   EOSABS 0.0 0.0 0.0 0.0  --   BASOSABS 0.0 0.0 0.0 0.0  --     Recent Labs  Lab 09/02/23 0420 09/03/23 0447 09/04/23 0412 09/05/23 0522 09/06/23 0648 09/07/23 0509 09/08/23 0443  NA 133*  132* 134* 136 128* 127* 124* 130*  K 3.4*  3.4* 3.7 3.7 3.5 3.3* 3.5 3.0*  CL 96*  96* 96* 96* 91* 90* 93* 95*  CO2 26  25 25 29 25 27 26 27   GLUCOSE 111*  113* 103* 123* 78 74 111* 106*  BUN 71*  72* 97* 108* 100* 97* 90* 75*  CREATININE 1.25*  1.25* 1.58* 1.65* 1.49* 1.36* 1.18 1.05  CALCIUM  9.7  9.5 10.4* 10.5* 9.8 9.8 9.0 9.7  AST 12* 13* 14*  --   --   --   --   ALT 10 8 8   --   --   --   --   ALKPHOS 65 68 74  --   --   --   --   BILITOT 0.6 0.7 0.7  --   --   --   --   ALBUMIN  2.1*  2.1* 2.2* 2.1*  --  2.1* 2.1* 1.9*  MG  --   --  2.5*  --   --   --   --     ------------------------------------------------------------------------------------------------------------------ No results for input(s): CHOL, HDL, LDLCALC, TRIG, CHOLHDL, LDLDIRECT in the last 72 hours.  Lab  Results  Component Value Date   HGBA1C 6.0 (H) 08/14/2023   ------------------------------------------------------------------------------------------------------------------ No results for input(s): TSH, T4TOTAL, T3FREE, THYROIDAB in the last 72 hours.  Invalid input(s): FREET3  Cardiac Enzymes No results for input(s): CKMB, TROPONINI, MYOGLOBIN in the last 168 hours.  Invalid input(s): CK ------------------------------------------------------------------------------------------------------------------    Component Value Date/Time   BNP 301.2 (H) 07/23/2023 0816    CBG: Recent Labs  Lab 09/07/23 1539 09/07/23 2010 09/07/23 2108 09/08/23 0630 09/08/23 0821  GLUCAP 118* 118* 117* 104* 116*    No results found for this or any previous visit (from the past 240 hours).    Radiology Studies: No results found.   Nilda Fendt, MD, PhD [facility]  Between 7 am - 7 pm I am available, please contact me via Amion (for emergencies) or Securechat (non urgent messages)  Between 7 pm - 7 am I am not available, please contact night coverage MD/APP via Amion

## 2023-09-08 NOTE — TOC Progression Note (Signed)
 Transition of Care (TOC) - Progression Note    Patient Details  Name: Frank [name]. MRN: [id number] Date of Birth: [birth year]  Transition of Care [facility]) CM/SW Contact  Andrez JULIANNA George, RN Phone Number: 09/08/2023, 11:57 AM  Clinical Narrative:     Patient faxed out in the [facility] are for SNF. Await bed offers.  TOC following.  Expected Discharge Plan: Long Term Acute Care (LTAC) Barriers to Discharge: Continued Medical Work up  Expected Discharge Plan and Services In-house Referral: Clinical Social Work Discharge Planning Services: CM Consult Post Acute Care Choice: Skilled Nursing Facility Living arrangements for the past 2 months: Single Family Home                 DME Arranged: N/A DME Agency: NA       HH Arranged: NA HH Agency: NA         Social Determinants of Health (SDOH) Interventions SDOH Screenings   Food Insecurity: No Food Insecurity (07/05/2023)  Housing: Low Risk  (07/05/2023)  Transportation Needs: No Transportation Needs (07/05/2023)  Utilities: Not At Risk (07/05/2023)  Tobacco Use: High Risk (08/30/2023)    Readmission Risk Interventions     No data to display

## 2023-09-08 NOTE — NC FL2 (Addendum)
 [place]  MEDICAID FL2 LEVEL OF CARE FORM     IDENTIFICATION  Patient Name: Frank [name]. Birthdate: 02-05-1966 Sex: male Admission Date (Current Location): 07/03/2023  [place] and [place] Number:   ([place] and [place])   Facility and Address:  [facility] [place] [facility], [address], [place], [place] [address]      Provider Number: [id number]  Attending Physician Name and Address:  Trixie Nilda HERO, MD  Relative Name and Phone Number:  Demitrus Francisco; Spouse; [phone]    Current Level of Care: Hospital Recommended Level of Care: SNF Prior Approval Number:    Date Approved/Denied:   PASRR Number:    Discharge Plan: SNF    Current Diagnoses: Patient Active Problem List   Diagnosis Date Noted   Goals of care, counseling/discussion 08/20/2023   Hematoma of right thigh 08/20/2023   Seizure disorder (HCC) 08/20/2023   DMII (diabetes mellitus, type 2) (HCC) 08/20/2023   Fever 08/06/2023   Aspergillosis, unspecified (HCC) 08/06/2023   Fungal infection 08/05/2023   Malfunction of tracheostomy stoma (HCC) 08/03/2023   Protein-calorie malnutrition, severe 07/29/2023   Multiple myeloma without remission (HCC) 07/24/2023   Lobar pneumonia, unspecified organism (HCC) 07/21/2023   On mechanically assisted ventilation (HCC) 07/19/2023   Acute metabolic encephalopathy 07/19/2023   Acute encephalopathy 07/14/2023   PAF (paroxysmal atrial fibrillation) (HCC) 07/09/2023   Acute hypoxemic respiratory failure (HCC) 07/08/2023   Atrial fibrillation with rapid ventricular response (HCC) 07/07/2023   AKI (acute kidney injury) (HCC) 07/07/2023   Multiple myeloma not having achieved remission (HCC) 07/04/2023   Generalized weakness 07/03/2023   Closed fracture of left distal humerus 01/29/2023   Status post total left knee replacement 11/23/2021   Unilateral primary osteoarthritis, right knee 01/25/2019   Carpal tunnel syndrome, left upper limb  10/15/2018   Carpal tunnel syndrome, right upper limb 10/15/2018   Bilateral hand numbness 09/09/2018   Plantar fasciitis of left foot 09/09/2018   Status post total replacement of left hip 12/26/2017   Unilateral primary osteoarthritis, left knee 11/12/2017   Hypogonadism, male 05/08/2016    Orientation RESPIRATION BLADDER Height & Weight     Self  Normal Incontinent Weight: (!) 138 kg Height:  6' 0.01 (182.9 cm)  BEHAVIORAL SYMPTOMS/MOOD NEUROLOGICAL BOWEL NUTRITION STATUS    Convulsions/Seizures Incontinent Diet, Feeding tube (Clear liquid diet and PEG with Nepro Carb steady 355 ml TID)  AMBULATORY STATUS COMMUNICATION OF NEEDS Skin   Extensive Assist Verbally  (wound to sacrum (unstageable)-dressing change daily/ Bilateral buttock wounds with dressing changes daily)                       Personal Care Assistance Level of Assistance  Bathing, Feeding, Dressing Bathing Assistance: Maximum assistance Feeding assistance: Maximum assistance Dressing Assistance: Maximum assistance     Functional Limitations Info  Sight, Hearing, Speech Sight Info: Impaired Hearing Info: Impaired Speech Info: Impaired    SPECIAL CARE FACTORS FREQUENCY  OT (By licensed OT), PT (By licensed PT), Speech therapy     PT Frequency: 5x/wk OT Frequency: 5x/wk     Speech Therapy Frequency: 5x/wk      Contractures Contractures Info: Not present    Additional Factors Info  Code Status, Allergies, Psychotropic, Insulin  Sliding Scale Code Status Info: Full code Allergies Info: Morphine and Codeine; Shellfish; Betadine  (povidone Iodine ); Bupropion; Chlorhexidine ; Influenza Vaccines; Metoprolol  Psychotropic Info: Paxil  40 mg daily Insulin  Sliding Scale Info: Semglee  15 units SQ daily/ Novolog  2 units  SQ three times a day/ Novolog  0-15 units SQ TID as needed   Suctioning Needs: oral suction   Current Medications (09/08/2023):  This is the current hospital active medication list Current  Facility-Administered Medications  Medication Dose Route Frequency Provider Last Rate Last Admin   0.9 %  sodium chloride  infusion   Intravenous Continuous Ennever, Peter R, MD   Stopped at 08/28/23 1208   0.9 %  sodium chloride  infusion   Intravenous Continuous Ennever, Peter R, MD   Stopped at 08/28/23 0839   0.9 %  sodium chloride  infusion   Intravenous Continuous Goldsborough, Kellie, MD   Stopped at 09/08/23 0314   acetaminophen  (TYLENOL ) 160 MG/5ML solution 650 mg  650 mg Per Tube Q4H PRN Ghimire, Kuber, MD   650 mg at 08/28/23 1451   Or   acetaminophen  (TYLENOL ) suppository 650 mg  650 mg Rectal Q4H PRN Ghimire, Kuber, MD   650 mg at 08/14/23 0023   acyclovir  (ZOVIRAX ) 200 MG capsule 400 mg  400 mg Per Tube BID Gherghe, Costin M, MD   400 mg at 09/08/23 0950   arformoterol  (BROVANA ) nebulizer solution 15 mcg  15 mcg Nebulization BID Gretta Leita SQUIBB, DO   15 mcg at 09/07/23 2050   collagenase  (SANTYL ) ointment   Topical Daily Patsy Lenis, MD   Given at 09/08/23 1032   feeding supplement (NEPRO CARB STEADY) liquid 355 mL  355 mL Per Tube TID PC & HS Gonfa, Taye T, MD   355 mL at 09/08/23 1116   feeding supplement (PROSource TF20) liquid 60 mL  60 mL Per Tube Daily Kathrin Simmer T, MD   60 mL at 09/08/23 0951   fentaNYL  (SUBLIMAZE ) injection 25 mcg  25 mcg Intravenous Q4H PRN Mansy, Jan A, MD   25 mcg at 09/08/23 9357   heparin  injection 5,000 Units  5,000 Units Subcutaneous Q8H Hammons, Kimberly B, RPH   5,000 Units at 09/08/23 9361   heparin  sodium (porcine) injection 10,000 Units  10,000 Units Intravenous Once Yamagata, Glenn, MD       hydrALAZINE  (APRESOLINE ) tablet 25 mg  25 mg Per Tube Q6H PRN Opyd, [name] S, MD       hydrOXYzine  (ATARAX ) tablet 5 mg  5 mg Oral TID PRN Gherghe, Costin M, MD       insulin  aspart (novoLOG ) injection 0-15 Units  0-15 Units Subcutaneous TID AC & HS Gonfa, Taye T, MD   2 Units at 09/08/23 1115   insulin  aspart (novoLOG ) injection 2 Units  2 Units  Subcutaneous TID AC & HS Gonfa, Taye T, MD   2 Units at 09/08/23 1115   insulin  glargine-yfgn (SEMGLEE ) injection 15 Units  15 Units Subcutaneous Q24H Gherghe, Costin M, MD   15 Units at 09/08/23 0951   ipratropium-albuterol  (DUONEB) 0.5-2.5 (3) MG/3ML nebulizer solution 3 mL  3 mL Nebulization Q4H PRN Kara Dorn NOVAK, MD       montelukast  (SINGULAIR ) chewable tablet 5 mg  5 mg Per Tube QHS Ennever, Peter R, MD   5 mg at 09/07/23 2120   multivitamin (RENA-VIT) tablet 1 tablet  1 tablet Per Tube QHS Olalere, Adewale A, MD   1 tablet at 09/07/23 2122   nicotine  (NICODERM CQ  - dosed in mg/24 hr) patch 7 mg  7 mg Transdermal Daily Gretta Leita P, DO   7 mg at 09/08/23 9049   nutrition supplement (JUVEN) (JUVEN) powder packet 1 packet  1 packet Per Tube BID BM Paytes, Massie LABOR, RPH  1 packet at 09/08/23 1122   ondansetron  (ZOFRAN ) injection 4 mg  4 mg Intravenous Q6H PRN Ennever, Peter R, MD   4 mg at 09/07/23 0247   Oral care mouth rinse  15 mL Mouth Rinse 4 times per day Gonfa, Taye T, MD   15 mL at 09/08/23 1116   oxyCODONE  (Oxy IR/ROXICODONE ) immediate release tablet 5 mg  5 mg Per Tube Q6H PRN Elgergawy, Dawood S, MD   5 mg at 09/08/23 1115   PARoxetine  (PAXIL ) tablet 40 mg  40 mg Per Tube Daily Desai, Rahul P, PA-C   40 mg at 09/08/23 9048   pneumococcal 20-valent conjugate vaccine (PREVNAR 20 ) injection 0.5 mL  0.5 mL Intramuscular Tomorrow-1000 Hunsucker, Donnice SAUNDERS, MD       polyethylene glycol (MIRALAX  / GLYCOLAX ) packet 17 g  17 g Per Tube BID Regalado, Belkys A, MD   17 g at 09/06/23 9076   polyvinyl alcohol  (LIQUIFILM TEARS) 1.4 % ophthalmic solution 1 drop  1 drop Both Eyes PRN Rober Gianotti T, MD   1 drop at 07/18/23 0555   revefenacin  (YUPELRI ) nebulizer solution 175 mcg  175 mcg Nebulization Daily Daren Ronnald BRAVO, NP   175 mcg at 09/07/23 0806   senna (SENOKOT) tablet 8.6 mg  1 tablet Per Tube Daily Regalado, Belkys A, MD   8.6 mg at 09/06/23 9076   sodium chloride  flush (NS) 0.9 %  injection 10-40 mL  10-40 mL Intracatheter Q12H Agarwala, Fredia, MD   10 mL at 09/07/23 2125   sodium chloride  flush (NS) 0.9 % injection 10-40 mL  10-40 mL Intracatheter PRN Arlinda Fredia, MD       valproate (DEPACON ) 750 mg in dextrose  5 % 50 mL IVPB  750 mg Intravenous Q12H Gonfa, Taye T, MD 57.5 mL/hr at 09/07/23 2125 750 mg at 09/07/23 2125   voriconazole  (VFEND ) tablet 350 mg  350 mg Per Tube Q12H Girguis, David, MD   350 mg at 09/07/23 2349     Discharge Medications: Please see discharge summary for a list of discharge medications.  Relevant Imaging Results:  Relevant Lab Results:   Additional Information SS# [id number]  Andrez JULIANNA George, RN

## 2023-09-08 NOTE — TOC Progression Note (Addendum)
 Transition of Care (TOC) - Progression Note    Patient Details  Name: Frank [name]. MRN: [id number] Date of Birth: 26-Mar-1965  [facility]) CM/SW Contact  Luise JAYSON Pan, [place] Phone Number: 09/08/2023, 10:11 AM  Clinical Narrative:   Per MD, patient will be having chemo on Thursday. Will aim for a thurday/fri dc to SNF. Needs SNF workup.   TOC will continue to follow.    Expected Discharge Plan: Long Term Acute Care (LTAC) Barriers to Discharge: Continued Medical Work up  Expected Discharge Plan and Services In-house Referral: Clinical Social Work Discharge Planning Services: CM Consult Post Acute Care Choice: Skilled Nursing Facility Living arrangements for the past 2 months: Single Family Home                 DME Arranged: N/A DME Agency: NA       HH Arranged: NA HH Agency: NA         Social Determinants of Health (SDOH) Interventions SDOH Screenings   Food Insecurity: No Food Insecurity (07/05/2023)  Housing: Low Risk  (07/05/2023)  Transportation Needs: No Transportation Needs (07/05/2023)  Utilities: Not At Risk (07/05/2023)  Tobacco Use: High Risk (08/30/2023)    Readmission Risk Interventions     No data to display

## 2023-09-08 NOTE — Consult Note (Signed)
 WOC Nurse wound follow up   Wound type: Unstageable sacrum  R Buttocks stage 3 L buttocks stage 2 Measurement: Unstageable sacrum 5 cm x 4 cm  R Buttocks stage 3: 6 cm x 4cm  L buttocks stage 2: 1 cm x 1 cm  Wound bed: Unstageable sacrum:  90% covered in yellow slough, 10% pink, moist L Buttocks stage 3: pink moist with scattered adipose tissue and granulation tissue R buttocks stage 2: pink moist Drainage (amount, consistency, odor)  Unstageable sacrum: purulent  R Buttocks stage 3:  serosanguineous  L buttocks stage 2: serosanguineous Periwound: intact  Dressing procedure/placement/frequency: Unstageable sacrum:  Cleanse wound with Vashe (lawson [phone]) allow to air dry, apply santyl  to wound bed, cover with saline moistened gauze and foam dressing, change daily and PRN soiling.  R buttocks/ L buttocks: cleanse wound with Vashe Soila # [phone]) allow to air dry and cover with foam dressing, lift dressing daily to view wound and cleanse and change foam dressing Q 3 days and PRN soiling.   WOC team will continue to folllow patient weekly, please re-consult if new needs arise  Doyal Polite, RN, MSN, [facility] WOC Team

## 2023-09-08 NOTE — Progress Notes (Signed)
 He is having a tough morning.  He is having pain in the back.  He is not able to eat.  He says his feeding tube is being used once a day.  It sounds like he is going to have a swallowing test today.  Hopefully, he will have any problems with dysphagia so he will be able to eat regular food.  He says the pain is in his back.  I am sure this is probably just from lying down.  His labs show sodium 130.  Potassium 3.0.  BUN 75 creatinine 1.05.  Calcium  9.7.  There is no CBC back.  He continues on his dialysis.  I would still expect that he will have treatment on Thursday.  I am not sure what the discharge plans are for him.  It sounds like he will be able to be discharged because he cannot set up for the dialysis.  The swallowing study will be critical.  Hopefully he will be able to swallow without aspirating and then be able to have regular food.  I am not sure why the tube feed is just once a day.  All of his vital signs are pretty good.  He has had no temperature.  Blood pressure is 127/95.  His lungs sound relatively clear bilaterally.  Cardiac exam regular rate and rhythm.  He has no murmurs.  Abdominal exam soft.  He has a feeding tube.  He has no fluid wave.  Bowel sounds are present.  Extremities shows a brace on his left arm.  Neurological exam is nonfocal.  I am glad he is able to talk to me.  I do feel bad that he has pain.  I hate that he is only getting the tube feed once a day.  I am not sure why that would be the case.  Again, a swallowing test is being done.  I know that the staff up on [address] are doing a good job with him.  I know this is incredibly complicated.   Jeralyn Crease, MD  [place] 2:8

## 2023-09-08 NOTE — Progress Notes (Signed)
 Patient ID: Frank Jama Desiderio Mickey., male   DOB: 1965-05-10, 58 y.o.   MRN: [id number] S: Complaining about being hungry and wants some breakfast. O:BP (!) 136/97 (BP Location: Right Arm)   Pulse 92   Temp 98.5 F (36.9 C)   Resp 19   Ht 6' 0.01 (1.829 m)   Wt (!) 138 kg   SpO2 92%   BMI 41.25 kg/m   Intake/Output Summary (Last 24 hours) at 09/08/2023 0945 Last data filed at 09/08/2023 0411 Gross per 24 hour  Intake 4905.17 ml  Output 2900 ml  Net 2005.17 ml   Intake/Output: I/O last 3 completed shifts: In: 4905.2 [I.V.:2005.2; Other:200; WH/HU:7514; IV Piggyback:215] Out: 4300 [Urine:4300]  Intake/Output this shift:  No intake/output data recorded. Weight change:  Gen: NAD CVS: RRR Resp: CTA Abd: +BS, soft, NT/nD Ext: no edema  Recent Labs  Lab 09/02/23 0420 09/03/23 0447 09/04/23 0412 09/05/23 0522 09/06/23 0648 09/07/23 0509 09/08/23 0443  NA 133*  132* 134* 136 128* 127* 124* 130*  K 3.4*  3.4* 3.7 3.7 3.5 3.3* 3.5 3.0*  CL 96*  96* 96* 96* 91* 90* 93* 95*  CO2 26  25 25 29 25 27 26 27   GLUCOSE 111*  113* 103* 123* 78 74 111* 106*  BUN 71*  72* 97* 108* 100* 97* 90* 75*  CREATININE 1.25*  1.25* 1.58* 1.65* 1.49* 1.36* 1.18 1.05  ALBUMIN  2.1*  2.1* 2.2* 2.1*  --  2.1* 2.1* 1.9*  CALCIUM  9.7  9.5 10.4* 10.5* 9.8 9.8 9.0 9.7  PHOS 2.9  --  3.0  --  2.8 2.9 3.5  AST 12* 13* 14*  --   --   --   --   ALT 10 8 8   --   --   --   --    Liver Function Tests: Recent Labs  Lab 09/02/23 0420 09/03/23 0447 09/04/23 0412 09/06/23 0648 09/07/23 0509 09/08/23 0443  AST 12* 13* 14*  --   --   --   ALT 10 8 8   --   --   --   ALKPHOS 65 68 74  --   --   --   BILITOT 0.6 0.7 0.7  --   --   --   PROT 5.9* 5.9* 5.9*  --   --   --   ALBUMIN  2.1*  2.1* 2.2* 2.1* 2.1* 2.1* 1.9*   No results for input(s): LIPASE, AMYLASE in the last 168 hours. No results for input(s): AMMONIA in the last 168 hours. CBC: Recent Labs  Lab 09/02/23 0420 09/03/23 0447  09/04/23 0412 09/05/23 0522 09/06/23 0648  WBC 2.5* 1.7* 2.7* 7.3 9.3  NEUTROABS 1.8 0.8* 1.7 6.1  --   HGB 8.9* 8.6* 8.6* 9.0* 9.4*  HCT 27.0* 26.1* 26.4* 27.0* 27.5*  MCV 102.3* 102.8* 103.9* 103.4* 103.4*  PLT 91* 99* 115* 124* 155   Cardiac Enzymes: No results for input(s): CKTOTAL, CKMB, CKMBINDEX, TROPONINI in the last 168 hours. CBG: Recent Labs  Lab 09/07/23 1539 09/07/23 2010 09/07/23 2108 09/08/23 0630 09/08/23 0821  GLUCAP 118* 118* 117* 104* 116*    Iron Studies: No results for input(s): IRON, TIBC, TRANSFERRIN, FERRITIN in the last 72 hours. Studies/Results: No results found.  acyclovir   400 mg Per Tube BID   arformoterol   15 mcg Nebulization BID   collagenase    Topical Daily   feeding supplement (NEPRO CARB STEADY)  355 mL Per Tube TID PC & HS   feeding supplement (  PROSource TF20)  60 mL Per Tube Daily   heparin  injection (subcutaneous)  5,000 Units Subcutaneous Q8H   heparin  sodium (porcine)  10,000 Units Intravenous Once   insulin  aspart  0-15 Units Subcutaneous TID AC & HS   insulin  aspart  2 Units Subcutaneous TID AC & HS   insulin  glargine-yfgn  15 Units Subcutaneous Q24H   midodrine   10 mg Per Tube Q M,W,F-HD   montelukast   5 mg Per Tube QHS   multivitamin  1 tablet Per Tube QHS   nicotine   7 mg Transdermal Daily   nutrition supplement (JUVEN)  1 packet Per Tube BID BM   mouth rinse  15 mL Mouth Rinse 4 times per day   PARoxetine   40 mg Per Tube Daily   pneumococcal 20-valent conjugate vaccine  0.5 mL Intramuscular Tomorrow-1000   polyethylene glycol  17 g Per Tube BID   revefenacin   175 mcg Nebulization Daily   senna  1 tablet Per Tube Daily   sodium chloride  flush  10-40 mL Intracatheter Q12H   voriconazole   350 mg Per Tube Q12H    BMET    Component Value Date/Time   NA 130 (L) 09/08/2023 0443   K 3.0 (L) 09/08/2023 0443   CL 95 (L) 09/08/2023 0443   CO2 27 09/08/2023 0443   GLUCOSE 106 (H) 09/08/2023 0443   BUN 75 (H)  09/08/2023 0443   CREATININE 1.05 09/08/2023 0443   CALCIUM  9.7 09/08/2023 0443   GFRNONAA >60 09/08/2023 0443   GFRAA >60 12/27/2017 0404   CBC    Component Value Date/Time   WBC 9.3 09/06/2023 0648   RBC 2.66 (L) 09/06/2023 0648   HGB 9.4 (L) 09/06/2023 0648   HCT 27.5 (L) 09/06/2023 0648   PLT 155 09/06/2023 0648   MCV 103.4 (H) 09/06/2023 0648   MCH 35.3 (H) 09/06/2023 0648   MCHC 34.2 09/06/2023 0648   RDW 23.2 (H) 09/06/2023 0648   LYMPHSABS 0.7 09/05/2023 0522   MONOABS 0.5 09/05/2023 0522   EOSABS 0.0 09/05/2023 0522   BASOSABS 0.0 09/05/2023 0522    Assessment/ Plan:     1. Acute kidney Injury on CKD3a: secondary to myeloma kidney. Transitioned to IHD on MWF schedule after previously being on CRRT with last HD session on 09/01/23. --------------------------------   - Urine output has improved and showing signs of renal recovery.  Last dialysis was on Monday  6/16    BUN continued to improve since then and has not required dialysis over the past week.  UOP has significantly improved as well as BUN/Cr.  No indication for dialysis at this time and it appears that he has had renal recovery.  Will consult IR to remove TDC.  Nothing further to add.  Will sign off at this time.  Please call with questions or concerns.   2. Acute metabolic encephalopathy: appears to wax and wane per charting     3. Multiple myeloma:  - Closely followed by [name] and started on aggressive regimen with Sarclisa , Kyprolis  and Cytoxan  - Appreciate oncology input   4. 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  Note plans to treat through 10/21/23 per ID note   5. Acute hypoxic respiratory failure - trach removed on 6/11 - pulm following   6. Anemia: from acute illness and multiple myeloma. ESA and transfusions per onc   7. Bacteremia - noted staph epidermidis from 6/7 blood cultures.   - Repeat blood cultures on 6/9 are  NGTD    - abx per primary  team and ID    8. Hypercalcemia: Corrected calcium  fairly high around 11-12. Due to Multiple myeloma and is responding to treatment.   Hyponatremia-  improving with improved renal function.   Fairy RONAL Sellar, MD [facility]

## 2023-09-08 NOTE — Progress Notes (Addendum)
 Pt yelling for pain medication for his back, had ordered Oxy IR 5mg  at 2100, ordered every 6hrs, Dr Lawence (on call) paged, yet to call back, pt repositioned in bed and was reassured. Frank [name], Frank [name]  Dr Lawence ordered IV Morphine but per Pharmacy pt might be allergic to Morphine, MD re-paged to be notified. Frank [name], Frank [name]

## 2023-09-09 DIAGNOSIS — C9000 Multiple myeloma not having achieved remission: Secondary | ICD-10-CM | POA: Diagnosis not present

## 2023-09-09 LAB — CBC WITH DIFFERENTIAL/PLATELET
Abs Immature Granulocytes: 0.67 10*3/uL — ABNORMAL HIGH (ref 0.00–0.07)
Basophils Absolute: 0 10*3/uL (ref 0.0–0.1)
Basophils Relative: 1 %
Eosinophils Absolute: 0 10*3/uL (ref 0.0–0.5)
Eosinophils Relative: 0 %
HCT: 29.9 % — ABNORMAL LOW (ref 39.0–52.0)
Hemoglobin: 9.9 g/dL — ABNORMAL LOW (ref 13.0–17.0)
Immature Granulocytes: 16 %
Lymphocytes Relative: 15 %
Lymphs Abs: 0.6 10*3/uL — ABNORMAL LOW (ref 0.7–4.0)
MCH: 34.9 pg — ABNORMAL HIGH (ref 26.0–34.0)
MCHC: 33.1 g/dL (ref 30.0–36.0)
MCV: 105.3 fL — ABNORMAL HIGH (ref 80.0–100.0)
Monocytes Absolute: 0.6 10*3/uL (ref 0.1–1.0)
Monocytes Relative: 15 %
Neutro Abs: 2.3 10*3/uL (ref 1.7–7.7)
Neutrophils Relative %: 53 %
Platelets: 208 10*3/uL (ref 150–400)
RBC: 2.84 MIL/uL — ABNORMAL LOW (ref 4.22–5.81)
RDW: 24 % — ABNORMAL HIGH (ref 11.5–15.5)
Smear Review: NORMAL
WBC: 4.2 10*3/uL (ref 4.0–10.5)
nRBC: 0 % (ref 0.0–0.2)

## 2023-09-09 LAB — RENAL FUNCTION PANEL
Albumin: 2 g/dL — ABNORMAL LOW (ref 3.5–5.0)
Anion gap: 11 (ref 5–15)
BUN: 71 mg/dL — ABNORMAL HIGH (ref 6–20)
CO2: 25 mmol/L (ref 22–32)
Calcium: 9.7 mg/dL (ref 8.9–10.3)
Chloride: 92 mmol/L — ABNORMAL LOW (ref 98–111)
Creatinine, Ser: 1.09 mg/dL (ref 0.61–1.24)
GFR, Estimated: >60 mL/min (ref >60)
Glucose, Bld: 93 mg/dL (ref 70–99)
Phosphorus: 2.8 mg/dL (ref 2.5–4.6)
Potassium: 3.6 mmol/L (ref 3.5–5.1)
Sodium: 128 mmol/L — ABNORMAL LOW (ref 135–145)

## 2023-09-09 LAB — GLUCOSE, CAPILLARY
Glucose-Capillary: 89 mg/dL (ref 70–99)
Glucose-Capillary: 136 mg/dL — ABNORMAL HIGH (ref 70–99)
Glucose-Capillary: 161 mg/dL — ABNORMAL HIGH (ref 70–99)
Glucose-Capillary: 156 mg/dL — ABNORMAL HIGH (ref 70–99)

## 2023-09-09 NOTE — Plan of Care (Signed)
 Dressing to sacral area changed. Large BM. Swallow study at bedside today   Problem: Activity: Goal: Risk for activity intolerance will decrease Outcome: Progressing   Problem: Nutrition: Goal: Adequate nutrition will be maintained Outcome: Progressing   Problem: Safety: Goal: Ability to remain free from injury will improve Outcome: Progressing   Problem: Skin Integrity: Goal: Risk for impaired skin integrity will decrease Outcome: Progressing

## 2023-09-09 NOTE — Progress Notes (Signed)
 Nutrition Follow-up  DOCUMENTATION CODES:  Severe malnutrition in context of acute illness/injury  INTERVENTION:  Continue bolus tube feeding via PEG: 6 cartons of Nepro 1.8 per day (1.5 cartons x 4 feeds per day) Will ensure there is a 2 hour break for voriconazole  administration BID (0900-1100, 2100-2300) Prosource TF20 60mL 1x/d Flush with 30mL before and after each blus feed (60mL 4x/d) This will provide 2600 kcal, 134g of protein, and of free water  ( TF+flush) Continue Juven 1 packet BID via tube, each packet provides 95 calories, 2.5 grams of protein (collagen) + micronutrients to support wound healing Continue Renal MVI daily via tube Requested new measured weight again, bed will need to be zeroed to ensure weight is accurate   NUTRITION DIAGNOSIS:  Severe Malnutrition related to acute illness (prolonged illness, interuptions in feeding) as evidenced by moderate fat depletion, moderate muscle depletion, percent weight loss (9.6% x 1 month). - remains applicable  GOAL:  Patient will meet greater than or equal to 90% of their needs - met with TF  MONITOR:  TF tolerance, I & O's, Labs, Weight trends  REASON FOR ASSESSMENT:  Consult Assessment of nutrition requirement/status  ASSESSMENT:  58 y.o. male with PMH of obesity, asthma, OSA on CPAP, GERD, HTN, HLD who presented due to feeling weak with AMS for a few days. Admitted with concern for sepsis, community-acquired pneumonia, encephalopathy as well as concern for multiple myeloma.  4/17 Admit to [facility] 4/21 SLP eval - NPO; IR attempted Cortrak but unable to be placed; CCM MD successfully placed NGT 4/22 Concern for aspiration overnight; Intubated 4/23 - CRRT 5/8 - tracheostomy 5/10 - CRRT discontinued 5/11 - transferred to [facility] 5/12 - iHD initiated  5/29 - PEG placed 5/31 - chemotherapy changed to Sarclisa /Kyprolis /Cytoxan  6/9 - MBS, NPO recommended  6/11 - trach removed 6/13 - BSE, NPO,  tunneled HD catheter removed for line holiday 6/16 - Tunneled HD replaced,  6/18 - FEES, thin liquids when supervised 6/19, 6/21 - BSE, clear liquids 6/23 - MBS, clear liquids, pt not able to full participate in study due to pain; no HD since 6/16, line pulled pt showing signs of renal recovery  Pt resting in bed at the time of assessment. Awake and alert, but not very talkative. Only shrugged his shoulders or nodded when responding to questions. Noted that pt told Oncology team he was only receiving 1 bolus a day. Reviewed MAR and pt has received all scheduled feeds and confirmed with RN this was accurate.   Requested new measured weight last week and advised RN pt would have to be removed from bed, bed zeroed, and then reweighed to confirm accuracy. Requested again today. Accurate weight needs to be obtained to ensure that TF regimen is appropriate as pt is approaching being ready to discharge.  Admit weight: 110.6 kg (4/17) Current weight: 138 kg (6/20) - ? accuracy  Intake/Output Summary (Last 24 hours) at 09/09/2023 0918 Last data filed at 09/08/2023 2000 Gross per 24 hour  Intake 1109 ml  Output 650 ml  Net 459 ml  Net IO Since Admission: -11,442.87 mL [09/09/23 0918]  Drains/Lines: PEG 20 Fr. LUQ UOP x 24 hours  Nutrition Related Medications:  Scheduled Meds:  NEPRO CARB STEADY  355 mL Per Tube TID PC & HS   PROSource TF20  60 mL Per Tube Daily   insulin  aspart  0-15 Units Subcutaneous TID AC & HS   insulin  aspart  2 Units Subcutaneous TID AC &  HS   insulin  glargine-yfgn  15 Units Subcutaneous Q24H   multivitamin  1 tablet Per Tube QHS   JUVEN  1 packet Per Tube BID BM   polyethylene glycol  17 g Per Tube BID   senna  1 tablet Per Tube Daily   voriconazole   350 mg Per Tube Q12H   PRN Meds: ondansetron   Labs Reviewed: Sodium 128, chloride 92 BUN 71 CBG ranges from 89-205 mg/dL over the last 24 hours HgbA1c 6.0%  Micronutrient Labs: CRP 1.5  Vitamin A  42.5  (WNL) Zinc  73 (WNL) Folate 17.8 (WNL)  Diet Order:   Diet Order             Diet clear liquid Room service appropriate? Yes; Fluid consistency: Thin  Diet effective now                  EDUCATION NEEDS:  Not appropriate for education at this time  Skin: Skin Integrity Issues Per WOC note 6/23 Unstageable  - sacrum 5 cm x 4 cm  Stage 3:  - R Buttocks 6 cm x 4cm  Stage 2: - L buttocks 1 cm x 1 cm   Last BM:  6/23 - type 6  Height:  Ht Readings from Last 1 Encounters:  07/22/23 6' 0.01 (1.829 m)   Weight:  Wt Readings from Last 1 Encounters:  09/05/23 (!) 138 kg   Ideal Body Weight:  80.9 kg  BMI:  Body mass index is 41.25 kg/m.  Estimated Nutritional Needs:  Kcal:  2400-2700 kcal/d Protein:  130-150g/d Fluid:  1L+UOP   Vernell Lukes, RD, LDN Registered Dietitian II Please reach out via secure chat

## 2023-09-09 NOTE — Progress Notes (Signed)
 PROGRESS NOTE  Frank [name]. FMW:[id number] DOB: [birth year] DOA: 07/03/2023 PCP: [name], MD   LOS: 68 days   Brief Narrative / Interim history: 58 year old PMH OSA CPAP, pathological fracture and multiple ORIF left Humerus likely due to multiple myeloma, A-fib not on anticoagulation, hyperlipidemia, tobacco use disorder who was  feeling weak for 1 week, admitted  to [facility] health with concern of sepsis, community-acquired pneumonia and acute encephalopathy.  Patient was found to have lytic lesions,  he was transferred to [facility] for  oncology treatment.  In the meantime he developed encephalopathy, acute renal failure.  He remained in the ICU for 36 days.  Underwent tracheostomy. He was also started on HD after  CRRT.  Subsequently PEG tube placed 5/29.   Significant events 4/17-Admitted to medical floor with oncology consultation to treat multiple myeloma. 4/19-transferred to ICU intubated, encephalopathic. 4/20-A-fib with RVR. Started heparin . Groin and biopsy site hematoma requiring multiple transfusions. EEG 4/25 >> moderate to severe encephalopathy without any seizures or epileptiform discharges. 4/29 Received Velcade  and Cytoxan  chemotherapy-intent palliative? 5/2 LP and ETT exchange  5/8 Trach placed. Some post op bleeding improved with thrombi-pad 5/10 Voriconazole  added back based on CT Chest findings 5/13 more alert and able to follow some commands. 5/23, transferred to floor. 5/29-PEG placed 5/31 chemotherapy changed to Sarclisa /Kyprolis /Cytoxan . 6/13 plan to remove HD cathter for holiday line 6/16 new HD catheter placed 6/23 has not required HD in the past week, remove HD catheter per nephrology  Subjective / 24h Interval events: No complaints this morning  Assesement and Plan: Principal Problem:   Multiple myeloma not having achieved remission (HCC) Active Problems:   AKI (acute kidney injury) (HCC)   Acute hypoxemic respiratory failure (HCC)   Goals  of care, counseling/discussion   Acute metabolic encephalopathy   Lobar pneumonia, unspecified organism (HCC)   Fever   Aspergillosis, unspecified (HCC)   Hematoma of right thigh   Generalized weakness   Atrial fibrillation with rapid ventricular response (HCC)   PAF (paroxysmal atrial fibrillation) (HCC)   On mechanically assisted ventilation (HCC)   Multiple myeloma without remission (HCC)   Protein-calorie malnutrition, severe   Malfunction of tracheostomy stoma (HCC)   Seizure disorder (HCC)   DMII (diabetes mellitus, type 2) (HCC)   Principal problem Recurrent fevers, sepsis, Staph Epi Bacteremia -Hospital course complicated by intermittent fevers, ID consulted and most recently evaluated patient 6/17.  He had a fever and MRSE bacteremia 1/4 bottles, completed a course of vancomycin  with the end date 6/20.  He did receive a line holiday.  Repeat blood culture 6/9 without growth. - He has unstageable sacral ulcer, continue local wound care.  Remains afebrile  Active problems  Aspergillosis with pneumonia -On Voriconazole  per ID, On Acyclovir .  End date for voriconazole  is 8/5   Multiple myeloma - He was  started on Velcade  and Cytoxan .  Chemotherapy changed to Sarclisa /Kyprolis /Cytoxan  on 5/31. Dr [name] following. Getting Chemo, 3 weeks on and 1 week off.  Last week chemotherapy has been postponed due to low counts, he will get another round of chemo this Thursday.  Could potentially go to SNF, discussed with [name] and he can continue chemotherapy even if the patient is in an SNF   Goals of care - Unfortunately poor long-term prognosis. Palliative care has discussed with wife previously.  Remain full code on full scope of care   Acute Hypoxic Respiratory Failure - Pulmonology following intermittently, trach removed 6/11.  Respiratory status is  stable, on minimal oxygen today, continue supportive care   Right Thigh Hematoma - per CT on 08/01/23: 12.1 x 30.2 x 7.6 cm  subcutaneous hematoma within the anterior right thigh. 8.4 x 10.4 x 22.2 cm subcutaneous hematoma within the right gluteal region - continue supportive care.  Hemoglobin stable   Severe acute metabolic encephalopathy with hypoactive delirium, seizure disorder - Normal MRI of the brain.  LP negative for meningitis or encephalitis.  EEG negative for seizures but encephalopathy. Home Depakote  discontinued in ICU.   Insulin -dependent diabetes type 2 with hyperglycemia- A1c 6.0, continue with sliding scale insulin   CBG (last 3)  Recent Labs    09/08/23 1701 09/08/23 2120 09/09/23 0623  GLUCAP 130* 205* 89    AKI on CKD stage IIIa, Uremia,  Hyponatremia, Hypokalemia -Due to MM, hypercalcemia and ACE inhibitors?  Nephrology consulted, was on hemodialysis but eventually he tolerated being off HD for the past week, tunneled catheter discontinued per nephrology   Paroxysmal A-fib - Unable to  anticoagulation due to hematoma. Rate controlled.    Obstructive Sleep Apnea -Trach removed   Anxiety/Bipolar disorder - Continue with Paxil    Severe Malnutrition - Continue tube feeding.  He is allowed clear liquid per speech   History of Pathological fracture and multiple ORIF left Humerus 01/2023   Hypokalemia -potassium stable   Hyponatremia -sodium overall stable    Scheduled Meds:  acyclovir   400 mg Per Tube BID   arformoterol   15 mcg Nebulization BID   collagenase    Topical Daily   feeding supplement (NEPRO CARB STEADY)  355 mL Per Tube TID PC & HS   feeding supplement (PROSource TF20)  60 mL Per Tube Daily   heparin  injection (subcutaneous)  5,000 Units Subcutaneous Q8H   heparin  sodium (porcine)  10,000 Units Intravenous Once   insulin  aspart  0-15 Units Subcutaneous TID AC & HS   insulin  aspart  2 Units Subcutaneous TID AC & HS   insulin  glargine-yfgn  15 Units Subcutaneous Q24H   montelukast   5 mg Per Tube QHS   multivitamin  1 tablet Per Tube QHS   nicotine   7 mg Transdermal Daily    nutrition supplement (JUVEN)  1 packet Per Tube BID BM   mouth rinse  15 mL Mouth Rinse 4 times per day   PARoxetine   40 mg Per Tube Daily   pneumococcal 20-valent conjugate vaccine  0.5 mL Intramuscular Tomorrow-1000   polyethylene glycol  17 g Per Tube BID   revefenacin   175 mcg Nebulization Daily   senna  1 tablet Per Tube Daily   sodium chloride  flush  10-40 mL Intracatheter Q12H   voriconazole   350 mg Per Tube Q12H   Continuous Infusions:  sodium chloride  Stopped (08/28/23 1208)   sodium chloride  Stopped (08/28/23 0839)   sodium chloride  Stopped (09/08/23 0314)   valproate sodium  750 mg (09/08/23 2221)   PRN Meds:.acetaminophen  (TYLENOL ) oral liquid 160 mg/5 mL **OR** acetaminophen , fentaNYL  (SUBLIMAZE ) injection, hydrALAZINE , hydrOXYzine , ipratropium-albuterol , ondansetron  (ZOFRAN ) IV, oxyCODONE , artificial tears, sodium chloride  flush  Current Outpatient Medications  Medication Instructions   albuterol  (PROVENTIL  HFA;VENTOLIN  HFA) 108 (90 Base) MCG/ACT inhaler 2 puffs, Inhalation, Every 6 hours PRN   albuterol  (PROVENTIL ) 2.5 mg, Every 2 hours PRN   aspirin  EC 81 mg, Oral, Daily, Swallow whole.   buPROPion (WELLBUTRIN XL) 150 mg, Daily   busPIRone  (BUSPAR ) 15 mg, Oral, Daily at bedtime   celecoxib  (CELEBREX ) 200 mg, Oral, Daily   clonazePAM  (KLONOPIN ) 1 mg, Oral, Once PRN  cyclobenzaprine  (FLEXERIL ) 5 mg, Oral, 3 times daily PRN   divalproex  (DEPAKOTE ) 500 mg, Oral, 2 times daily   EPINEPHrine  (EPI-PEN) 0.3 mg, Intramuscular, As needed   esomeprazole (NEXIUM) 40 mg, Oral, Every morning   Fluticasone -Salmeterol (ADVAIR) 500-50 MCG/DOSE AEPB 2 puffs, Inhalation, Every morning   furosemide  (LASIX ) 40 mg, Oral, Daily PRN   gabapentin  (NEURONTIN ) 300-600 mg, Oral, See admin instructions, 300 mg in the morning, 600 mg at bedtime    hydrALAZINE  (APRESOLINE ) 10 mg, Oral, 2 times daily   hydrochlorothiazide  (HYDRODIURIL ) 25 mg, Oral, Daily   lisinopril  (ZESTRIL ) 40 mg, Oral,  Daily   metFORMIN  (GLUCOPHAGE ) 1,000 mg, Oral, 2 times daily with meals   montelukast  (SINGULAIR ) 10 mg, Oral, Daily at bedtime   OVER THE COUNTER MEDICATION 400 mg, Oral, Daily at bedtime, Burdock root    oxyCODONE  (ROXICODONE ) 5 mg, Oral, Every 4 hours PRN   PARoxetine  (PAXIL ) 40 mg, Oral, BH-each morning   spironolactone  (ALDACTONE ) 25 mg, Oral, Daily at bedtime   tamsulosin  (FLOMAX ) 0.4 mg, Oral, Daily at bedtime   testosterone  cypionate (DEPOTESTOSTERONE CYPIONATE) 100 mg, Intramuscular, Every 14 days   verapamil  (CALAN -SR) 120 mg, Oral, Daily   Vitamin D  (Ergocalciferol ) (DRISDOL ) 50,000 Units, Oral, Every Thu    Diet Orders (From admission, onward)     Start     Ordered   09/08/23 0914  Diet clear liquid Room service appropriate? Yes; Fluid consistency: Thin  Diet effective now       Comments: Will intermittently cough, this is okay.  Question Answer Comment  Room service appropriate? Yes   Fluid consistency: Thin      09/08/23 0914            DVT prophylaxis: heparin  injection 5,000 Units Start: 08/14/23 2200 Place and maintain sequential compression device Start: 07/14/23 1033   Lab Results  Component Value Date   PLT 208 09/09/2023      Code Status: Full Code  Family Communication: no family at bedside   Status is: Inpatient Remains inpatient appropriate because: severity of illness  Level of care: Progressive  Consultants:  ID Nephrology Oncology  Objective: Vitals:   09/08/23 1900 09/08/23 2341 09/09/23 0810 09/09/23 0817  BP: (!) 143/107 120/87  (!) 126/99  Pulse:  100  91  Resp:  18  18  Temp: 97.6 F (36.4 C) 99 F (37.2 C)  98.2 F (36.8 C)  TempSrc: Axillary Oral  Oral  SpO2:  94% 99% 98%  Weight:      Height:        Intake/Output Summary (Last 24 hours) at 09/09/2023 0930 Last data filed at 09/08/2023 2000 Gross per 24 hour  Intake 991 ml  Output 650 ml  Net 341 ml   Wt Readings from Last 3 Encounters:  09/05/23 (!) 138 kg   06/05/23 113.7 kg  01/29/23 117.9 kg    Examination:  Constitutional: NAD Eyes: lids and conjunctivae normal, no scleral icterus ENMT: mmm Neck: normal, supple Respiratory: clear to auscultation bilaterally, no wheezing, no crackles. Normal respiratory effort.  Cardiovascular: Regular rate and rhythm, no murmurs / rubs / gallops. No LE edema. Abdomen: soft, no distention, no tenderness. Bowel sounds positive.    Data Reviewed: I have independently reviewed following labs and imaging studies   CBC Recent Labs  Lab 09/03/23 0447 09/04/23 0412 09/05/23 0522 09/06/23 0648 09/09/23 0436  WBC 1.7* 2.7* 7.3 9.3 4.2  HGB 8.6* 8.6* 9.0* 9.4* 9.9*  HCT 26.1* 26.4* 27.0* 27.5* 29.9*  PLT 99* 115* 124* 155 208  MCV 102.8* 103.9* 103.4* 103.4* 105.3*  MCH 33.9 33.9 34.5* 35.3* 34.9*  MCHC 33.0 32.6 33.3 34.2 33.1  RDW 22.2* 22.5* 22.5* 23.2* 24.0*  LYMPHSABS 0.6* 0.5* 0.7  --  0.6*  MONOABS 0.3 0.5 0.5  --  0.6  EOSABS 0.0 0.0 0.0  --  0.0  BASOSABS 0.0 0.0 0.0  --  0.0    Recent Labs  Lab 09/03/23 0447 09/04/23 0412 09/05/23 0522 09/06/23 0648 09/07/23 0509 09/08/23 0443 09/09/23 0436  NA 134* 136 128* 127* 124* 130* 128*  K 3.7 3.7 3.5 3.3* 3.5 3.0* 3.6  CL 96* 96* 91* 90* 93* 95* 92*  CO2 25 29 25 27 26 27 25   GLUCOSE 103* 123* 78 74 111* 106* 93  BUN 97* 108* 100* 97* 90* 75* 71*  CREATININE 1.58* 1.65* 1.49* 1.36* 1.18 1.05 1.09  CALCIUM  10.4* 10.5* 9.8 9.8 9.0 9.7 9.7  AST 13* 14*  --   --   --   --   --   ALT 8 8  --   --   --   --   --   ALKPHOS 68 74  --   --   --   --   --   BILITOT 0.7 0.7  --   --   --   --   --   ALBUMIN  2.2* 2.1*  --  2.1* 2.1* 1.9* 2.0*  MG  --  2.5*  --   --   --   --   --     ------------------------------------------------------------------------------------------------------------------ No results for input(s): CHOL, HDL, LDLCALC, TRIG, CHOLHDL, LDLDIRECT in the last 72 hours.  Lab Results  Component Value Date    HGBA1C 6.0 (H) 08/14/2023   ------------------------------------------------------------------------------------------------------------------ No results for input(s): TSH, T4TOTAL, T3FREE, THYROIDAB in the last 72 hours.  Invalid input(s): FREET3  Cardiac Enzymes No results for input(s): CKMB, TROPONINI, MYOGLOBIN in the last 168 hours.  Invalid input(s): CK ------------------------------------------------------------------------------------------------------------------    Component Value Date/Time   BNP 301.2 (H) 07/23/2023 0816    CBG: Recent Labs  Lab 09/08/23 0821 09/08/23 1114 09/08/23 1701 09/08/23 2120 09/09/23 0623  GLUCAP 116* 136* 130* 205* 89    No results found for this or any previous visit (from the past 240 hours).    Radiology Studies: No results found.   Nilda Fendt, MD, PhD [facility]  Between 7 am - 7 pm I am available, please contact me via Amion (for emergencies) or Securechat (non urgent messages)  Between 7 pm - 7 am I am not available, please contact night coverage MD/APP via Amion

## 2023-09-09 NOTE — Progress Notes (Signed)
 Physical Therapy Treatment Patient Details Name: Frank [name]. MRN: [id number] DOB: [birth year] Today's Date: 09/09/2023   History of Present Illness 58 y.o. male transferred from [facility] 07/03/23 to [facility] for management of newly diagnosed plasma cell myeloma with weakness and AMS. Pt also with AKI and metabolic encephalopathy. 5/10 transfer to [facility] for iHD. 4/22 Intubated and chemo initiated. 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 5/6 IVIG started. 5/8-6/11 trach. 5/12 iHD initiated. 5/29 PEG placed. 6/5 chemo initiated. PMHx:Lt THA, carpal tunnel syndrome, Lt TKA, Lt humerus fx, PAF, HTN, HLD, bipolar disorder, obesity, T2DM    PT Comments  Pt received in supine and agreeable to session. Pt demonstrates improved initiation this session, requiring mod A +2 to sit to EOB. Pt able to tolerate ~15 mins sitting EOB and participates in seated BLE and reaching exercises. Pt limited by quick fatigue, requiring frequent rest breaks with RUE propped on pillows to maintain neutral trunk. Pt reports improved back pain with sitting EOB, so asked nursing to assist pt to sit EOB again this afternoon if able for increased strength and endurance.  Pt continues to benefit from PT services to progress toward functional mobility goals.     If plan is discharge home, recommend the following: Two people to help with walking and/or transfers;Assistance with cooking/housework;Assist for transportation;Two people to help with bathing/dressing/bathroom;Direct supervision/assist for medications management;Direct supervision/assist for financial management;Help with stairs or ramp for entrance;Assistance with feeding   Can travel by private vehicle     No  Equipment Recommendations  Hospital bed;Hoyer lift;Wheelchair (measurements PT);Wheelchair cushion (measurements PT);BSC/3in1    Recommendations for Other Services       Precautions / Restrictions Precautions Precautions: Fall Recall of Precautions/Restrictions:  Impaired Precaution/Restrictions Comments: episode of syncope, sacral wound, chemo Restrictions Weight Bearing Restrictions Per Provider Order: No LUE Weight Bearing Per Provider Order: Non weight bearing Other Position/Activity Restrictions: Lt humerus non-union, splinted     Mobility  Bed Mobility Overal bed mobility: Needs Assistance Bed Mobility: Supine to Sit, Sit to Supine     Supine to sit: Mod assist, +2 for physical assistance Sit to supine: Max assist, +2 for physical assistance   General bed mobility comments: cues for sequencing and mod A +2 for BLE advancement to EOB and trunk elevation. max A +2 for return to supine    Transfers                   General transfer comment: deferred    Ambulation/Gait                   Stairs             Wheelchair Mobility     Tilt Bed    Modified Rankin (Stroke Patients Only)       Balance Overall balance assessment: Needs assistance Sitting-balance support: Single extremity supported, No upper extremity supported, Feet supported Sitting balance-Leahy Scale: Poor Sitting balance - Comments: relies on RUE support due to fatigue. Able to reach with RUE briefly without LOB       Standing balance comment: nt                            Communication Communication Communication: Impaired Factors Affecting Communication: Reduced clarity of speech  Cognition Arousal: Alert Behavior During Therapy: Flat affect   PT - Cognitive impairments: Difficult to assess Difficult to assess due to: Impaired communication  Following commands: Impaired Following commands impaired: Follows one step commands with increased time    Cueing Cueing Techniques: Verbal cues  Exercises General Exercises - Lower Extremity Long Arc Quad: AROM, Both, Seated, 10 reps Other Exercises Other Exercises: dynamic reaching sitting EOB with RUE    General Comments General  comments (skin integrity, edema, etc.): mild dizziness sitting EOB      Pertinent Vitals/Pain Pain Assessment Pain Assessment: Faces Faces Pain Scale: Hurts little more Pain Location: back Pain Descriptors / Indicators: Grimacing, Aching, Discomfort, Guarding Pain Intervention(s): Limited activity within patient's tolerance, Monitored during session, Repositioned     PT Goals (current goals can now be found in the care plan section) Acute Rehab PT Goals Patient Stated Goal: unable to state PT Goal Formulation: Patient unable to participate in goal setting Time For Goal Achievement: 09/06/23 Progress towards PT goals: Progressing toward goals    Frequency    Min 1X/week       AM-PAC PT 6 Clicks Mobility   Outcome Measure  Help needed turning from your back to your side while in a flat bed without using bedrails?: A Lot Help needed moving from lying on your back to sitting on the side of a flat bed without using bedrails?: A Lot Help needed moving to and from a bed to a chair (including a wheelchair)?: Total Help needed standing up from a chair using your arms (e.g., wheelchair or bedside chair)?: Total Help needed to walk in hospital room?: Total Help needed climbing 3-5 steps with a railing? : Total 6 Click Score: 8    End of Session   Activity Tolerance: Patient tolerated treatment well Patient left: in bed;with call bell/phone within reach Nurse Communication: Mobility status PT Visit Diagnosis: Other abnormalities of gait and mobility (R26.89);Muscle weakness (generalized) (M62.81);Difficulty in walking, not elsewhere classified (R26.2);Unsteadiness on feet (R26.81)     Time: 9159-9089 PT Time Calculation (min) (ACUTE ONLY): 30 min  Charges:    $Therapeutic Exercise: 8-22 mins $Therapeutic Activity: 8-22 mins PT General Charges $$ ACUTE PT VISIT: 1 Visit                     Darryle George, PTA Acute Rehabilitation Services Secure Chat Preferred   Office:([phone]    Darryle George 09/09/2023, 10:33 AM

## 2023-09-09 NOTE — Procedures (Signed)
 Objective Swallowing Evaluation: Type of Study: FEES-Fiberoptic Endoscopic Evaluation of Swallow   Patient Details  Name: Frank [name]. MRN: [id number] Date of Birth: August 20, 1965  Today's Date: 09/09/2023 Time: SLP Start Time (ACUTE ONLY): 1200 -SLP Stop Time (ACUTE ONLY): 1235  SLP Time Calculation (min) (ACUTE ONLY): 35 min   Past Medical History:  Past Medical History:  Diagnosis Date   Anxiety    Arthritis    Asthma    Bipolar disorder (HCC)    Current every day smoker    Depression    Diabetes mellitus, type II (HCC)    Dyspnea    History of kidney stones    History of pneumonia    Hyperlipidemia    Hypertension    Morbid obesity (HCC)    Sedentary lifestyle    Seizures (HCC)    last seizure 12 yrs ago, no current problem   Sleep apnea    uses CPAP nightly   Past Surgical History:  Past Surgical History:  Procedure Laterality Date   CARDIAC CATHETERIZATION  2020   carpal tunel Left    COLONOSCOPY     HARDWARE REMOVAL Left 07/26/2022   Procedure: HARDWARE REMOVAL ELBOW;  Surgeon: [name], MD;  Location: [facility] OR;  Service: Orthopedics;  Laterality: Left;   IR FLUORO GUIDE CV LINE RIGHT  08/07/2023   IR FLUORO GUIDE CV LINE RIGHT  09/01/2023   IR GASTROSTOMY TUBE MOD SED  08/14/2023   IR REMOVAL TUN CV CATH W/O FL  08/29/2023   IR US  GUIDE VASC ACCESS RIGHT  08/07/2023   IR US  GUIDE VASC ACCESS RIGHT  09/01/2023   ORIF HUMERUS FRACTURE Left 01/16/2022   Procedure: OPEN REDUCTION INTERNAL FIXATION (ORIF) DISTAL HUMERUS FRACTURE;  Surgeon: [name], MD;  Location: [facility] OR;  Service: Orthopedics;  Laterality: Left;   ORIF HUMERUS FRACTURE Left 01/29/2023   Procedure: OPEN REDUCTION INTERNAL FIXATION (ORIF) DISTAL HUMERUS FRACTURE;  Surgeon: [name], MD;  Location: [facility] OR;  Service: Orthopedics;  Laterality: Left;   OTHER SURGICAL HISTORY     R & L shoulder   OTHER SURGICAL HISTORY Right    Knee surgery x several   TOTAL HIP ARTHROPLASTY Left  12/26/2017   Procedure: LEFT TOTAL HIP ARTHROPLASTY ANTERIOR APPROACH;  Surgeon: Vernetta Lonni GRADE, MD;  Location: [facility];  Service: Orthopedics;  Laterality: Left;   TOTAL KNEE ARTHROPLASTY Left 11/23/2021   Procedure: LEFT TOTAL KNEE ARTHROPLASTY;  Surgeon: Vernetta Lonni GRADE, MD;  Location: [facility];  Service: Orthopedics;  Laterality: Left;   HPI: HPI: 58 y.o. male transferred from [facility] 07/03/23 to [facility] for management of newly diagnosed plasma cell myeloma with weakness and AMS. Pt also with AKI and metabolic encephalopathy. 5/10 transfer to [facility] for iHD.Bedside swallow eval 4/18 wiht recs for NPO due to somnolence. 4/22 Intubated and chemo initiated. 4/23-5/10 CRRT. 5/6 IVIG started. 5/8 trach. 5/12 iHD initiated. 5/18 inability to oxygenate through trach, intubated and trach revision. PMHx:Lt THA, carpal tunnel syndrome, Lt TKA, Lt humerus fx, PAF, HTN, HLD, bipolar disorder, obesity, T2DM.   Subjective: pt denies dysphagia    Assessment / Plan / Recommendation     09/09/2023    1:00 PM  Clinical Impressions  Clinical Impression Pt demonstrates ongoing oropharyngeal dysphagia. After a 5 days of access to clear thin liquids, pt has not shown any negative consequence of aspiration. He is adamant that he wasnt to eat something and is being less participatory in interventions. SLP  encouraged pt to attempt MBS again for clearest results of severity of aspiration (which is difficult to visualize on FEES) but pt adamantly refused. Says he was jerked into the seat and he will only do the swallow test in his room.   His pharynx is still dry with moderate dried secretions the in the vestibule and glottis. Valleculae now clear however. Initially pt swallowed thin liquids with ongoing penetration and sensed aspiration that elicited coughs that cleared his airway and the dried secretions in his larynx. Pt does not have sensation of penetration, only mild aspiration. There is decreased  laryngeal elevation, epiglottic deflection and poor glottic closure. Pt has full adduction with a cough, but not on command or during the swallow prior to the white out. Regardless of rate, position or viscosity pt has significant penetration of all POs to the vocal folds. Cued throat clears, effortful swallows do not clear peentrate. Only trace aspiration of thin  elicits a sufficient cough response to clear. Pt unwilling to attempt supraglottic swallow or positional strategies.  Unwilling to masticate solids.   Concerned that advancing pts diet beyond clears is going to increase his risk of detrimental impact of dysphagia. Pt will be at increased risk of aspirating particulate matter mixed with thin. Will need to proceed with caution. Pt to have full supervision. Attempt purees only with clears, follow bites with sips and be encouraged to cough hard intermittently.  SLP Visit Diagnosis Dysphagia, oropharyngeal phase (R13.12)  Attention and concentration deficit following --  Frontal lobe and executive function deficit following --  Impact on safety and function Moderate aspiration risk         09/09/2023    1:00 PM  Treatment Recommendations  Treatment Recommendations Therapy as outlined in treatment plan below        09/09/2023    1:00 PM  Prognosis  Prognosis for improved oropharyngeal function Fair  Barriers to Reach Goals Behavior  Barriers/Prognosis Comment --       09/09/2023    1:00 PM  Diet Recommendations  SLP Diet Recommendations Dysphagia 1 (Puree) solids;Thin liquid  Liquid Administration via Straw;Cup  Medication Administration Via alternative means  Compensations Slow rate;Small sips/bites;Follow solids with liquid;Clear throat after each swallow;Effortful swallow  Postural Changes Seated upright at 90 degrees         09/09/2023    1:00 PM  Other Recommendations  Recommended Consults --  Oral Care Recommendations Oral care before and after PO  Caregiver  Recommendations Have oral suction available  Follow Up Recommendations Skilled nursing-short term rehab (<3 hours/day)  Assistance recommended at discharge --  Functional Status Assessment --       09/09/2023    1:00 PM  Frequency and Duration   Speech Therapy Frequency (ACUTE ONLY) min 2x/week  Treatment Duration 2 weeks         09/09/2023    1:00 PM  Oral Phase  Oral Phase WFL  Oral - Pudding Teaspoon --  Oral - Pudding Cup --  Oral - Honey Teaspoon --  Oral - Honey Cup --  Oral - Nectar Teaspoon --  Oral - Nectar Cup --  Oral - Nectar Straw --  Oral - Thin Teaspoon --  Oral - Thin Cup --  Oral - Thin Straw --  Oral - Puree --  Oral - Mech Soft --  Oral - Regular --  Oral - Multi-Consistency --  Oral - Pill --  Oral Phase - Comment --  09/09/2023    1:00 PM  Pharyngeal Phase  Pharyngeal Phase Impaired  Pharyngeal- Pudding Teaspoon --  Pharyngeal --  Pharyngeal- Pudding Cup --  Pharyngeal --  Pharyngeal- Honey Teaspoon NT  Pharyngeal --  Pharyngeal- Honey Cup NT  Pharyngeal --  Pharyngeal- Nectar Teaspoon NT  Pharyngeal --  Pharyngeal- Nectar Cup NT  Pharyngeal --  Pharyngeal- Nectar Straw Reduced airway/laryngeal closure;Reduced epiglottic inversion;Reduced laryngeal elevation;Penetration/Aspiration during swallow;Trace aspiration;Inter-arytenoid space residue  Pharyngeal Material enters airway, CONTACTS cords and not ejected out;Material enters airway, passes BELOW cords and not ejected out despite cough attempt by patient  Pharyngeal- Thin Teaspoon NT  Pharyngeal --  Pharyngeal- Thin Cup Penetration/Aspiration during swallow;Reduced laryngeal elevation;Reduced epiglottic inversion;Reduced airway/laryngeal closure  Pharyngeal Material enters airway, passes BELOW cords then ejected out;Material enters airway, CONTACTS cords and not ejected out  Pharyngeal- Thin Straw --  Pharyngeal --  Pharyngeal- Puree Reduced epiglottic inversion;Reduced laryngeal  elevation;Penetration/Aspiration during swallow;Pharyngeal residue - valleculae  Pharyngeal Material enters airway, CONTACTS cords and not ejected out  Pharyngeal- Mechanical Soft Reduced tongue base retraction;Reduced epiglottic inversion;Reduced laryngeal elevation;Pharyngeal residue - valleculae  Pharyngeal --  Pharyngeal- Regular NT  Pharyngeal --  Pharyngeal- Multi-consistency --  Pharyngeal --  Pharyngeal- Pill --  Pharyngeal --  Pharyngeal Comment --         No data to display           Anahla Bevis, Consuelo Fitch 09/09/2023, 2:01 PM

## 2023-09-10 DIAGNOSIS — C9000 Multiple myeloma not having achieved remission: Secondary | ICD-10-CM | POA: Diagnosis not present

## 2023-09-10 LAB — CBC WITH DIFFERENTIAL/PLATELET
Abs Immature Granulocytes: 0.86 10*3/uL — ABNORMAL HIGH (ref 0.00–0.07)
Basophils Absolute: 0 10*3/uL (ref 0.0–0.1)
Basophils Relative: 1 %
Eosinophils Absolute: 0 10*3/uL (ref 0.0–0.5)
Eosinophils Relative: 0 %
HCT: 26.9 % — ABNORMAL LOW (ref 39.0–52.0)
Hemoglobin: 9.2 g/dL — ABNORMAL LOW (ref 13.0–17.0)
Immature Granulocytes: 14 %
Lymphocytes Relative: 12 %
Lymphs Abs: 0.7 10*3/uL (ref 0.7–4.0)
MCH: 35.8 pg — ABNORMAL HIGH (ref 26.0–34.0)
MCHC: 34.2 g/dL (ref 30.0–36.0)
MCV: 104.7 fL — ABNORMAL HIGH (ref 80.0–100.0)
Monocytes Absolute: 1 10*3/uL (ref 0.1–1.0)
Monocytes Relative: 16 %
Neutro Abs: 3.6 10*3/uL (ref 1.7–7.7)
Neutrophils Relative %: 57 %
Platelets: 214 10*3/uL (ref 150–400)
RBC: 2.57 MIL/uL — ABNORMAL LOW (ref 4.22–5.81)
RDW: 24.6 % — ABNORMAL HIGH (ref 11.5–15.5)
Smear Review: NORMAL
WBC: 6.2 10*3/uL (ref 4.0–10.5)
nRBC: 0 % (ref 0.0–0.2)

## 2023-09-10 LAB — RENAL FUNCTION PANEL
Albumin: 2 g/dL — ABNORMAL LOW (ref 3.5–5.0)
Anion gap: 11 (ref 5–15)
BUN: 67 mg/dL — ABNORMAL HIGH (ref 6–20)
CO2: 24 mmol/L (ref 22–32)
Calcium: 9.3 mg/dL (ref 8.9–10.3)
Chloride: 91 mmol/L — ABNORMAL LOW (ref 98–111)
Creatinine, Ser: 0.97 mg/dL (ref 0.61–1.24)
GFR, Estimated: >60 mL/min (ref >60)
Glucose, Bld: 92 mg/dL (ref 70–99)
Phosphorus: 2.8 mg/dL (ref 2.5–4.6)
Potassium: 3.4 mmol/L — ABNORMAL LOW (ref 3.5–5.1)
Sodium: 126 mmol/L — ABNORMAL LOW (ref 135–145)

## 2023-09-10 LAB — GLUCOSE, CAPILLARY
Glucose-Capillary: 96 mg/dL (ref 70–99)
Glucose-Capillary: 152 mg/dL — ABNORMAL HIGH (ref 70–99)
Glucose-Capillary: 132 mg/dL — ABNORMAL HIGH (ref 70–99)
Glucose-Capillary: 157 mg/dL — ABNORMAL HIGH (ref 70–99)

## 2023-09-10 LAB — OSMOLALITY, URINE: Osmolality, Ur: 279 mosm/kg — ABNORMAL LOW (ref 300–900)

## 2023-09-10 LAB — SODIUM, URINE, RANDOM: Sodium, Ur: <10 mmol/L

## 2023-09-10 MED ORDER — POTASSIUM CHLORIDE CRYS ER 20 MEQ PO TBCR
40.0000 meq | EXTENDED_RELEASE_TABLET | Freq: Two times a day (BID) | ORAL | Status: AC
  Administered 2023-09-10 (×2): 40 meq via ORAL
  Filled 2023-09-10 (×2): qty 2

## 2023-09-10 NOTE — Progress Notes (Signed)
  Progress Note   Patient: Frank [name]. FMW:[id number] DOB: 28-Apr-1965 DOA: 07/03/2023     69 DOS: the patient was seen and examined on 09/10/2023 at 10:10AM      Brief hospital course: 58 y.o. M with MM, pathological L humerus fracture, AF not on [facility] admitted for sepsis, dCHF, CAD, encephalopathy and renal failure.  See longer summary by Dr. Trixie from 6/24.     Assessment and Plan: Recurrent fevers, sepsis, Staph Epi Bacteremia Completed antibiotics, no new active infection.    Aspergillosis with pneumonia - Continue voriconazole   Multiple myeloma  - Consult hematology - Continue acyclovir    Acute Hypoxic Respiratory Failure  Resolved  Right Thigh Hematoma Stable  Severe acute metabolic encephalopathy with hypoactive delirium, seizure disorder  Mentation appears improved  CAD  Chronic diastolic CHF Appears euvolemic, no active chest pain -Hold spironolactone , lisinopril , HCTZ, Lasix , aspirin , verapamil , hydralazine   Insulin -dependent diabetes type 2 with hyperglycemia Glucose controlled - Continue glargine - Continue SS corrections    AKI on CKD stage IIIa  Cr resolved to baseline   Paroxysmal A-fib  Not on AC due to hematoma.  In sinus currently   Obstructive Sleep Apnea   Now s/p trach   Anxiety/Bipolar disorder  - Hold Buspar , Clonazepam , gabapentin  - Continue Depacon , Paxil   Asthma No active flare - Continue Yupelri , Brovana , Duoneb, Singulair  - Hold ICS/LABA   Severe Malnutrition Obesity class 1, - Continue tube feeds - Hold Nexium   History of Pathological fracture and multiple ORIF left Humerus 01/2023 - Outpatient follow up with Orthopedics   BPH - Hold Flomax   Hypokalemia - Supp K  Hyponatremia Stable, asymptomatic.   - Check urine studies          Subjective: Arm hurts.  No chest pain, no swelling, no respiratory distress.  Oriented to self, hospital, [facility], redirectable to month.       Physical  Exam: BP 117/86 (BP Location: Right Arm)   Pulse 97   Temp 98.2 F (36.8 C) (Axillary)   Resp 16   Ht 6' 0.01 (1.829 m)   Wt 96 kg   SpO2 98%   BMI 28.70 kg/m   Obese adult male, lying in bed, appears debilitated Tachycardic, no murmurs, no peripheral edema Left arm into splint, no significant swelling Respiratory rate normal, lung sounds diminished, no rales or wheezes appreciated Abdomen soft, no tenderness palpation, attention normal, affect normal, judgment and insight appear impaired.  Severe generalized weakness, unable to move the left arm due to pain    Data Reviewed: Basic metabolic panel shows sodium down to 126, potassium down to 3.4 BUN elevated at 67, creatinine stable Hemoglobin stable at 9.2, no leukocytosis or thrombocytopenia     Family Communication:     Disposition: Status is: Inpatient         Author: Lonni SHAUNNA Dalton, MD 09/10/2023 4:22 PM  For on call review [email or web address].

## 2023-09-10 NOTE — Progress Notes (Signed)
 Speech Language Pathology Treatment: Dysphagia  Patient Details Name: Frank [name]. MRN: [id number] DOB: 04/08/1965 Today's Date: 09/10/2023 Time: 8584-8564 SLP Time Calculation (min) (ACUTE ONLY): 20 min  Assessment / Plan / Recommendation Clinical Impression  Patient seen by SLP for skilled treatment focused on dysphagia goals. Patient was awake, alert and agreeable to session. When SLP introduced EMST device he commented, Jesus got one of them at home and that it was recommended by a doctor a few years ago. SLP setup device at lowest setting and patient able to return demonstrate to properly perform. SLP increased resistance slightly and after patient performed another trial, he then said, I dont want to do any more of that. He did request some ice water  which he drank via straw sips. He required cues to perform strategy of cough/throat clear. Overall, he appeared to tolerate thin liquids well. SLP will continue to follow.    HPI HPI: HPI: 58 y.o. male transferred from [facility] 07/03/23 to [facility] for management of newly diagnosed plasma cell myeloma with weakness and AMS. Pt also with AKI and metabolic encephalopathy. 5/10 transfer to [facility] for iHD.Bedside swallow eval 4/18 wiht recs for NPO due to somnolence. 4/22 Intubated and chemo initiated. 4/23-5/10 CRRT. 5/6 IVIG started. 5/8 trach. 5/12 iHD initiated. 5/18 inability to oxygenate through trach, intubated and trach revision. PMHx:Lt THA, carpal tunnel syndrome, Lt TKA, Lt humerus fx, PAF, HTN, HLD, bipolar disorder, obesity, T2DM.      SLP Plan  Continue with current plan of care          Recommendations  Diet recommendations: Dysphagia 1 (puree);Thin liquid Liquids provided via: Cup;Straw Medication Administration: Via alternative means Supervision: Patient able to self feed;Full supervision/cueing for compensatory strategies Compensations: Slow rate;Small sips/bites;Follow solids with liquid;Clear throat after each  swallow;Effortful swallow Postural Changes and/or Swallow Maneuvers: Seated upright 90 degrees;Upright 30-60 min after meal                  Oral care BID   Intermittent Supervision/Assistance Dysphagia, oropharyngeal phase (R13.12)     Continue with current plan of care    Norleen IVAR Blase, MA, CCC-SLP Speech Therapy

## 2023-09-10 NOTE — Plan of Care (Signed)
  Problem: Education: Goal: Knowledge of General Education information will improve Description: Including pain rating scale, medication(s)/side effects and non-pharmacologic comfort measures Outcome: Progressing   Problem: Health Behavior/Discharge Planning: Goal: Ability to manage health-related needs will improve Outcome: Not Progressing   Problem: Clinical Measurements: Goal: Ability to maintain clinical measurements within normal limits will improve Outcome: Not Progressing Goal: Will remain free from infection Outcome: Not Progressing Goal: Diagnostic test results will improve Outcome: Not Progressing Goal: Respiratory complications will improve Outcome: Progressing Goal: Cardiovascular complication will be avoided Outcome: Progressing   Problem: Activity: Goal: Risk for activity intolerance will decrease Outcome: Not Progressing   Problem: Nutrition: Goal: Adequate nutrition will be maintained Outcome: Progressing   Problem: Coping: Goal: Level of anxiety will decrease Outcome: Progressing   Problem: Elimination: Goal: Will not experience complications related to bowel motility Outcome: Progressing Goal: Will not experience complications related to urinary retention Outcome: Progressing   Problem: Pain Managment: Goal: General experience of comfort will improve and/or be controlled Outcome: Not Progressing   Problem: Safety: Goal: Ability to remain free from injury will improve Outcome: Not Progressing   Problem: Skin Integrity: Goal: Risk for impaired skin integrity will decrease Outcome: Not Progressing   Problem: Education: Goal: Ability to describe self-care measures that may prevent or decrease complications (Diabetes Survival Skills Education) will improve Outcome: Not Progressing Goal: Individualized Educational Video(s) Outcome: Not Progressing   Problem: Fluid Volume: Goal: Ability to maintain a balanced intake and output will  improve Outcome: Progressing   Problem: Health Behavior/Discharge Planning: Goal: Ability to identify and utilize available resources and services will improve Outcome: Not Progressing Goal: Ability to manage health-related needs will improve Outcome: Not Progressing   Problem: Metabolic: Goal: Ability to maintain appropriate glucose levels will improve Outcome: Not Progressing   Problem: Nutritional: Goal: Maintenance of adequate nutrition will improve Outcome: Not Progressing Goal: Progress toward achieving an optimal weight will improve Outcome: Not Progressing   Problem: Skin Integrity: Goal: Risk for impaired skin integrity will decrease Outcome: Not Progressing   Problem: Tissue Perfusion: Goal: Adequacy of tissue perfusion will improve Outcome: Not Progressing   Problem: Education: Goal: Knowledge about tracheostomy care/management will improve Outcome: Not Progressing   Problem: Activity: Goal: Ability to tolerate increased activity will improve Outcome: Not Progressing   Problem: Health Behavior/Discharge Planning: Goal: Ability to manage tracheostomy will improve Outcome: Not Progressing   Problem: Respiratory: Goal: Patent airway maintenance will improve Outcome: Progressing   Problem: Role Relationship: Goal: Ability to communicate will improve Outcome: Not Progressing   Problem: Education: Goal: Knowledge of disease and its progression will improve Outcome: Not Progressing Goal: Individualized Educational Video(s) Outcome: Not Progressing   Problem: Fluid Volume: Goal: Compliance with measures to maintain balanced fluid volume will improve Outcome: Not Progressing   Problem: Health Behavior/Discharge Planning: Goal: Ability to manage health-related needs will improve Outcome: Not Progressing   Problem: Nutritional: Goal: Ability to make healthy dietary choices will improve Outcome: Not Progressing   Problem: Clinical Measurements: Goal:  Complications related to the disease process, condition or treatment will be avoided or minimized Outcome: Not Progressing

## 2023-09-10 NOTE — TOC Progression Note (Signed)
 Transition of Care (TOC) - Progression Note    Patient Details  Name: Frank [name]. MRN: [id number] Date of Birth: 09-Sep-1965  Transition of Care [facility]) CM/SW Contact  Almarie CHRISTELLA Goodie, [place] Phone Number: 09/10/2023, 9:51 AM  Clinical Narrative:   CSW spoke with patient's spouse, [name], to discuss working towards SNF placement. [name] said she was told by the doctors that the patient would remain in hospital as long as he was on chemo, and CSW discussed that per oncology, chemo can be done outpatient once a week. CSW confirmed with MD. [name] hopeful that the patient can be placed closer to home in [place], [place]. CSW explained that patient had been faxed out, but need to determine the plan for outpatient cancer treatment and whether the SNF could provide transportation. [name] would be unable to get the patient in the car. [name] also expressed concern about the patient being on narcotics, as he does have a remote drug abuse history and she's worried that he is depressed and could be setup for a relapse. CSW relayed information to MD, patient can be tapered off to only receiving Tylenol  and Ibuprofen. CSW contacted Admissions at [facility] [address] [facility] [address] in [place], to ask about possibility of providing transport for patient to outpatient chemo. Awaiting response.    Expected Discharge Plan: Skilled Nursing Facility Barriers to Discharge: Continued Medical Work up  Expected Discharge Plan and Services In-house Referral: Clinical Social Work Discharge Planning Services: CM Consult Post Acute Care Choice: Skilled Nursing Facility Living arrangements for the past 2 months: Single Family Home                 DME Arranged: N/A DME Agency: NA       HH Arranged: NA HH Agency: NA         Social Determinants of Health (SDOH) Interventions SDOH Screenings   Food Insecurity: No Food Insecurity (07/05/2023)  Housing: Low Risk  (07/05/2023)  Transportation Needs: No  Transportation Needs (07/05/2023)  Utilities: Not At Risk (07/05/2023)  Tobacco Use: High Risk (08/30/2023)    Readmission Risk Interventions     No data to display

## 2023-09-10 NOTE — Progress Notes (Signed)
 Occupational Therapy Treatment Patient Details Name: Frank [name]. MRN: [id number] DOB: [birth year] Today's Date: 09/10/2023   History of present illness 58 y.o. male transferred from [facility] 07/03/23 to [facility] for management of newly diagnosed plasma cell myeloma with weakness and AMS. Pt also with AKI and metabolic encephalopathy. 5/10 transfer to [facility] for iHD. 4/22 Intubated and chemo initiated. 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 5/6 IVIG started. 5/8-6/11 trach. 5/12 iHD initiated. 5/29 PEG placed. 6/5 chemo initiated. PMHx:Lt THA, carpal tunnel syndrome, Lt TKA, Lt humerus fx, PAF, HTN, HLD, bipolar disorder, obesity, T2DM   OT comments  Pt making slow progression in therapy sessions, able to tolerate 20 mins sitting EOB and perform LE exercises and reaching activities. Pt sitting EOB with CGA, worked on correcting R lean into upright sitting posture. He expresses strong interest in getting off his bottom but with +1 assist and no recliner in room we were not able to transfer pt. He needs +2-3 to transfers as it was a total A just to move pt but he was not assisting much with stand effort. OT to continue to progress pt as able. Patient will benefit from continued inpatient follow up therapy, <3 hours/day       If plan is discharge home, recommend the following:  A lot of help with walking and/or transfers;Two people to help with walking and/or transfers;A lot of help with bathing/dressing/bathroom;Two people to help with bathing/dressing/bathroom;Assistance with cooking/housework;Assistance with feeding;Direct supervision/assist for medications management;Direct supervision/assist for financial management;Assist for transportation;Help with stairs or ramp for entrance   Equipment Recommendations  Other (comment) (defer to next level of care)    Recommendations for Other Services      Precautions / Restrictions Precautions Precautions: Fall Recall of Precautions/Restrictions:  Impaired Precaution/Restrictions Comments: episode of syncope, sacral wound, chemo Restrictions Weight Bearing Restrictions Per Provider Order: No LUE Weight Bearing Per Provider Order: Non weight bearing Other Position/Activity Restrictions: Lt humerus non-union, splinted       Mobility Bed Mobility Overal bed mobility: Needs Assistance Bed Mobility: Supine to Sit, Sit to Supine     Supine to sit: Total assist, HOB elevated, Used rails Sit to supine: Total assist, Used rails   General bed mobility comments: lateral scoot to HOB, total A. Attempted to do a stand transition but pt not able to clear bottom with single person assist.    Transfers                         Balance Overall balance assessment: Needs assistance Sitting-balance support: Single extremity supported, No upper extremity supported, Feet supported Sitting balance-Leahy Scale: Poor Sitting balance - Comments: able to sit with grossly CGA, R lateral lean but able to reposition into upright sitting for brief periods of time. Postural control: Right lateral lean                                 ADL either performed or assessed with clinical judgement   ADL   Eating/Feeding: Moderate assistance;Bed level   Grooming: Sitting;Minimal assistance                                 General ADL Comments: worked with pt mostly on sitting balance, currently needing total A to min A for ADLs.    Extremity/Trunk Assessment Upper Extremity Assessment RUE  Deficits / Details: Generalized weakness, pt using functionally during bed mobility, impaired grasp (loose). propping self with RUE. LUE Deficits / Details: NWB, splinted. Limited hand ROM. poor grip strength. Pt seemingly treat it as if flaccid-will continue to monitor L use.            Vision       Perception     Praxis     Communication Communication Communication: Impaired Factors Affecting Communication: Reduced  clarity of speech   Cognition Arousal: Alert Behavior During Therapy: Flat affect Cognition: Cognition impaired           Executive functioning impairment (select all impairments): Problem solving                   Following commands: Impaired Following commands impaired: Follows one step commands with increased time      Cueing   Cueing Techniques: Verbal cues  Exercises General Exercises - Lower Extremity Long Arc Quad: AROM, Seated, 5 reps, Both Heel Slides: Seated, Left, 5 reps Other Exercises Other Exercises: dynamic reaching sitting EOB with RUE    Shoulder Instructions       General Comments no c/o dizziness. Pt really wants OOB to relieve bottom pain but no recliner in room. Discussed with RN to see if there were any available recliners but none at the moment.    Pertinent Vitals/ Pain       Pain Assessment Pain Assessment: Faces Faces Pain Scale: Hurts little more Pain Location: back, bottom Pain Descriptors / Indicators: Sore, Discomfort, Grimacing Pain Intervention(s): Monitored during session, Repositioned, Limited activity within patient's tolerance  Home Living                                          Prior Functioning/Environment              Frequency  Min 1X/week        Progress Toward Goals  OT Goals(current goals can now be found in the care plan section)  Progress towards OT goals: Not progressing toward goals - comment (slow progress)  Acute Rehab OT Goals Patient Stated Goal: less pain OT Goal Formulation: With patient Time For Goal Achievement: 09/12/23 Potential to Achieve Goals: Good  Plan      Co-evaluation                 AM-PAC OT 6 Clicks Daily Activity     Outcome Measure   Help from another person eating meals?: A Lot Help from another person taking care of personal grooming?: A Lot Help from another person toileting, which includes using toliet, bedpan, or urinal?: Total Help  from another person bathing (including washing, rinsing, drying)?: A Lot Help from another person to put on and taking off regular upper body clothing?: A Lot Help from another person to put on and taking off regular lower body clothing?: A Lot 6 Click Score: 11    End of Session    OT Visit Diagnosis: Unsteadiness on feet (R26.81);Other abnormalities of gait and mobility (R26.89);Muscle weakness (generalized) (M62.81);Pain Pain - part of body:  (bottom)   Activity Tolerance Patient tolerated treatment well   Patient Left in bed;with call bell/phone within reach   Nurse Communication Mobility status        Time: 8376-8295 OT Time Calculation (min): 41 min  Charges: OT General Charges $OT Visit: 1 Visit OT  Treatments $Therapeutic Activity: 23-37 mins  09/10/2023  AB, OTR/L  Acute Rehabilitation Services  Office: [phone]   Curtistine JONETTA Das 09/10/2023, 6:10 PM

## 2023-09-11 ENCOUNTER — Encounter (HOSPITAL_COMMUNITY): Payer: Self-pay | Admitting: Hematology & Oncology

## 2023-09-11 DIAGNOSIS — C9000 Multiple myeloma not having achieved remission: Secondary | ICD-10-CM | POA: Diagnosis not present

## 2023-09-11 DIAGNOSIS — L8990 Pressure ulcer of unspecified site, unspecified stage: Secondary | ICD-10-CM | POA: Insufficient documentation

## 2023-09-11 LAB — GLUCOSE, CAPILLARY
Glucose-Capillary: 112 mg/dL — ABNORMAL HIGH (ref 70–99)
Glucose-Capillary: 131 mg/dL — ABNORMAL HIGH (ref 70–99)
Glucose-Capillary: 101 mg/dL — ABNORMAL HIGH (ref 70–99)
Glucose-Capillary: 108 mg/dL — ABNORMAL HIGH (ref 70–99)

## 2023-09-11 LAB — RENAL FUNCTION PANEL
Albumin: 2.2 g/dL — ABNORMAL LOW (ref 3.5–5.0)
Anion gap: 5 (ref 5–15)
BUN: 65 mg/dL — ABNORMAL HIGH (ref 6–20)
CO2: 28 mmol/L (ref 22–32)
Calcium: 9.6 mg/dL (ref 8.9–10.3)
Chloride: 93 mmol/L — ABNORMAL LOW (ref 98–111)
Creatinine, Ser: 0.98 mg/dL (ref 0.61–1.24)
GFR, Estimated: >60 mL/min (ref >60)
Glucose, Bld: 117 mg/dL — ABNORMAL HIGH (ref 70–99)
Phosphorus: 3.3 mg/dL (ref 2.5–4.6)
Potassium: 4 mmol/L (ref 3.5–5.1)
Sodium: 126 mmol/L — ABNORMAL LOW (ref 135–145)

## 2023-09-11 LAB — CBC WITH DIFFERENTIAL/PLATELET
Abs Immature Granulocytes: 0 10*3/uL (ref 0.00–0.07)
Basophils Absolute: 0 10*3/uL (ref 0.0–0.1)
Basophils Relative: 0 %
Blasts: 1 %
Eosinophils Absolute: 0 10*3/uL (ref 0.0–0.5)
Eosinophils Relative: 0 %
HCT: 29.7 % — ABNORMAL LOW (ref 39.0–52.0)
Hemoglobin: 9.9 g/dL — ABNORMAL LOW (ref 13.0–17.0)
Lymphocytes Relative: 11 %
Lymphs Abs: 0.6 10*3/uL — ABNORMAL LOW (ref 0.7–4.0)
MCH: 35.5 pg — ABNORMAL HIGH (ref 26.0–34.0)
MCHC: 33.3 g/dL (ref 30.0–36.0)
MCV: 106.5 fL — ABNORMAL HIGH (ref 80.0–100.0)
Monocytes Absolute: 0.6 10*3/uL (ref 0.1–1.0)
Monocytes Relative: 11 %
Neutro Abs: 4.2 10*3/uL (ref 1.7–7.7)
Neutrophils Relative %: 77 %
Platelets: 241 10*3/uL (ref 150–400)
RBC: 2.79 MIL/uL — ABNORMAL LOW (ref 4.22–5.81)
RDW: 24.8 % — ABNORMAL HIGH (ref 11.5–15.5)
WBC: 5.4 10*3/uL (ref 4.0–10.5)
nRBC: 0 /100{WBCs}
nRBC: 0.4 % — ABNORMAL HIGH (ref 0.0–0.2)

## 2023-09-11 LAB — VORICONAZOLE, SERUM: Voriconazole, Serum: 0.7 ug/mL

## 2023-09-11 LAB — OSMOLALITY: Osmolality: 293 mosm/kg (ref 275–295)

## 2023-09-11 MED ORDER — RENA-VITE PO TABS
1.0000 | ORAL_TABLET | Freq: Every day | ORAL | Status: DC
  Administered 2023-09-11: 1 via ORAL
  Filled 2023-09-11: qty 1

## 2023-09-11 MED ORDER — ACYCLOVIR 200 MG PO CAPS
400.0000 mg | ORAL_CAPSULE | Freq: Two times a day (BID) | ORAL | Status: DC
  Administered 2023-09-11 – 2023-11-07 (×119): 400 mg via ORAL
  Filled 2023-09-11 (×117): qty 2

## 2023-09-11 MED ORDER — ACETAMINOPHEN 160 MG/5ML PO SOLN
650.0000 mg | ORAL | Status: DC | PRN
  Administered 2023-09-14 – 2023-11-07 (×13): 650 mg via ORAL
  Filled 2023-09-11 (×15): qty 20.3

## 2023-09-11 MED ORDER — HYDRALAZINE HCL 25 MG PO TABS: 25.0000 mg | ORAL_TABLET | Freq: Four times a day (QID) | ORAL | Status: DC | PRN

## 2023-09-11 MED ORDER — VORICONAZOLE 50 MG PO TABS
350.0000 mg | ORAL_TABLET | Freq: Two times a day (BID) | ORAL | Status: DC
  Administered 2023-09-11 – 2023-09-22 (×22): 350 mg via ORAL
  Filled 2023-09-11 (×24): qty 3

## 2023-09-11 MED ORDER — PAROXETINE HCL 20 MG PO TABS
40.0000 mg | ORAL_TABLET | Freq: Every day | ORAL | Status: DC
  Administered 2023-09-12 – 2023-11-07 (×59): 40 mg via ORAL
  Filled 2023-09-11: qty 4
  Filled 2023-09-11 (×26): qty 2
  Filled 2023-09-11 (×2): qty 4
  Filled 2023-09-11 (×3): qty 2
  Filled 2023-09-11: qty 4
  Filled 2023-09-11 (×10): qty 2
  Filled 2023-09-11: qty 4
  Filled 2023-09-11 (×8): qty 2
  Filled 2023-09-11: qty 4
  Filled 2023-09-11: qty 2
  Filled 2023-09-11: qty 4
  Filled 2023-09-11 (×5): qty 2

## 2023-09-11 MED ORDER — SODIUM CHLORIDE 0.9 % IV SOLN: INTRAVENOUS | Status: AC

## 2023-09-11 MED ORDER — ACETAMINOPHEN 650 MG RE SUPP: 650.0000 mg | RECTAL | Status: DC | PRN

## 2023-09-11 MED ORDER — OXYCODONE HCL 5 MG PO TABS
5.0000 mg | ORAL_TABLET | Freq: Four times a day (QID) | ORAL | Status: DC | PRN
  Administered 2023-09-11 – 2023-09-22 (×23): 5 mg via ORAL
  Filled 2023-09-11 (×23): qty 1

## 2023-09-11 MED ORDER — JUVEN PO PACK
1.0000 | PACK | Freq: Two times a day (BID) | ORAL | Status: DC
  Administered 2023-09-11 – 2023-10-04 (×44): 1 via ORAL
  Filled 2023-09-11 (×40): qty 1

## 2023-09-11 MED ORDER — NEPRO/CARBSTEADY PO LIQD
355.0000 mL | Freq: Three times a day (TID) | ORAL | Status: DC
  Administered 2023-09-11 – 2023-09-16 (×19): 355 mL
  Filled 2023-09-11: qty 474

## 2023-09-11 MED ORDER — MONTELUKAST SODIUM 5 MG PO CHEW
5.0000 mg | CHEWABLE_TABLET | Freq: Every day | ORAL | Status: DC
  Administered 2023-09-11 – 2023-11-06 (×60): 5 mg via ORAL
  Filled 2023-09-11 (×59): qty 1

## 2023-09-11 NOTE — Progress Notes (Signed)
 Physical Therapy Treatment Patient Details Name: Frank [name]. MRN: [id number] DOB: 1966-02-27 Today's Date: 09/11/2023   History of Present Illness 58 y.o. male transferred from [facility] 07/03/23 to [facility] for management of newly diagnosed plasma cell myeloma with weakness and AMS. Pt also with AKI and metabolic encephalopathy. 5/10 transfer to [facility] for iHD. 4/22 Intubated and chemo initiated. 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 5/6 IVIG started. 5/8-6/11 trach. 5/12 iHD initiated. 5/29 PEG placed. 6/5 chemo initiated. PMHx:Lt THA, carpal tunnel syndrome, Lt TKA, Lt humerus fx, PAF, HTN, HLD, bipolar disorder, obesity, T2DM    PT Comments  Pt in bed upon arrival with family present and agreeable to PT session. Continues to require MaxAx2 to TotalAx2 for bed mobility. Worked on dynamic seated balance tasks for ~20 min with MinA to CGA needed to support balance. Unable to attempt stand transfer today as pt felt fatigued and lightheaded after sitting on EOB. Multiple rest breaks in between exercises due to fatigue. Continue to recommend <3hrs post acute rehab to work on mobility. Acute PT to follow.      If plan is discharge home, recommend the following: Two people to help with walking and/or transfers;Assistance with cooking/housework;Assist for transportation;Two people to help with bathing/dressing/bathroom;Direct supervision/assist for medications management;Direct supervision/assist for financial management;Help with stairs or ramp for entrance;Assistance with feeding   Can travel by private vehicle     No  Equipment Recommendations  Hospital bed;Hoyer lift;Wheelchair (measurements PT);Wheelchair cushion (measurements PT);BSC/3in1    Recommendations for Other Services       Precautions / Restrictions Precautions Precautions: Fall Recall of Precautions/Restrictions: Impaired Precaution/Restrictions Comments: episode of syncope, sacral wound, chemo Restrictions Weight Bearing  Restrictions Per Provider Order: Yes LUE Weight Bearing Per Provider Order: Non weight bearing Other Position/Activity Restrictions: Lt humerus non-union, splinted     Mobility  Bed Mobility Overal bed mobility: Needs Assistance Bed Mobility: Supine to Sit, Sit to Supine     Supine to sit: +2 for physical assistance, +2 for safety/equipment, Max assist Sit to supine: Total assist, +2 for physical assistance, +2 for safety/equipment   General bed mobility comments: MaxAx2 with use of bed pad to shift hips towards EOB and raise trunk. Pt slightly able to assist with bringing legs towards EOB. TotalAx2 required for return to supine    Transfers    General transfer comment: Deferred due to feeling lightheaded      Balance Overall balance assessment: Needs assistance Sitting-balance support: Single extremity supported, Feet supported Sitting balance-Leahy Scale: Fair Sitting balance - Comments: Able to sit with MinA to CGA for R lateral lean Postural control: Right lateral lean       Communication Communication Communication: Impaired Factors Affecting Communication: Reduced clarity of speech  Cognition Arousal: Alert Behavior During Therapy: Flat affect   PT - Cognitive impairments: Awareness, Attention, Initiation, Sequencing, Problem solving, Safety/Judgement    PT - Cognition Comments: Limited initiation with increased time needed. Would respond to questions or commands after multiple attempts Following commands: Impaired Following commands impaired: Follows one step commands with increased time    Cueing Cueing Techniques: Verbal cues  Exercises General Exercises - Lower Extremity Ankle Circles/Pumps: AROM, Both, 5 reps, Supine Quad Sets: AROM, Both, 5 reps, Supine Other Exercises Other Exercises: forward reaching towards target with RUE Other Exercises: x5 R lateral leans, MinA    General Comments General comments (skin integrity, edema, etc.): Family present and  supportive during session. Symptoms of lightheadedness after sitting on EOB for 10  min.      Pertinent Vitals/Pain Pain Assessment Pain Assessment: Faces Faces Pain Scale: Hurts even more Pain Location: bottom Pain Descriptors / Indicators: Sore, Discomfort, Grimacing Pain Intervention(s): Limited activity within patient's tolerance, Monitored during session, Repositioned     PT Goals (current goals can now be found in the care plan section) Acute Rehab PT Goals Patient Stated Goal: to get out of bed PT Goal Formulation: With patient Time For Goal Achievement: 09/25/23 Potential to Achieve Goals: Fair Progress towards PT goals: Progressing toward goals    Frequency    Min 1X/week       AM-PAC PT 6 Clicks Mobility   Outcome Measure  Help needed turning from your back to your side while in a flat bed without using bedrails?: A Lot Help needed moving from lying on your back to sitting on the side of a flat bed without using bedrails?: Total Help needed moving to and from a bed to a chair (including a wheelchair)?: Total Help needed standing up from a chair using your arms (e.g., wheelchair or bedside chair)?: Total Help needed to walk in hospital room?: Total Help needed climbing 3-5 steps with a railing? : Total 6 Click Score: 7    End of Session   Activity Tolerance: Other (comment);Patient limited by fatigue (symptoms of lightheadedness) Patient left: in bed;with call bell/phone within reach;with family/visitor present Nurse Communication: Mobility status PT Visit Diagnosis: Other abnormalities of gait and mobility (R26.89);Muscle weakness (generalized) (M62.81);Difficulty in walking, not elsewhere classified (R26.2);Unsteadiness on feet (R26.81)     Time: 1410-1445 PT Time Calculation (min) (ACUTE ONLY): 35 min  Charges:    $Therapeutic Exercise: 8-22 mins $Therapeutic Activity: 8-22 mins PT General Charges $$ ACUTE PT VISIT: 1 Visit                      Kate ORN, PT, DPT Secure Chat Preferred  Rehab Office [phone]   Kate BRAVO Wendolyn 09/11/2023, 4:08 PM

## 2023-09-11 NOTE — Progress Notes (Signed)
 Pharmacy Antibiotic Note  Frank [name]. is a 58 y.o. male admitted on 07/03/2023 with generalized weakness (multifactorial secondary to hypercalcemia, suspected malignancy, multiple myeloma).  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   The patient had two recent voriconazole  levels drawn. A level on 6/14 that resulted at 1.8 mcg/ml - notably drawn 1 hour AFTER the dose was given and a level of 0.6 mcg/ml drawn 14 hours after the prior dose. Assumed level is still >1 mcg/ml and at goal of 1-5 mcg/ml. Will hold the course at the current dose for now.   Level history: 08/01/23 1.9 mcg/ml on 350 mg/12h 08/08/23 0.4 mcg/ml on 350 mg/12h (low due to tube feed binding) 08/19/23 1.3 mcg/ml continue 350 mg/12h 08/30/23 1.8 mcg/ml (drawn AFTER the dose) 08/31/23 0.6 mcg/ml drawn prior to the dose but ~2 hours late of true trough 09/08/23 0.7 mcg/ml drawn ~2 hours late of true trough and likely binding to bolus feeds  The patient continues on bolus feeds - timing adjusted to avoid 1hr before/after voriconazole  administration however the 2300 tube bolus feeding has commonly be given <1h from the voriconazole  dose (6 out of 7 in the past week) and is likely binding the drug to an extent.   Reached out to RD and will re-time the bedtime bolus feed to midnight to give a better window to avoid binding and will re-communicate to RN staff.   Plan: - Continue voriconazole  350 mg BID per tube - keep at 1000/2200 - Bolus feeds rescheduled to 0000/0800/1200/1800 - Will continue to monitor voriconazole  and levels as indicated - would recommend another recheck on 7/2  Height: 6' 0.01 (182.9 cm) Weight: 96 kg (211 lb 10.3 oz) IBW/kg (Calculated) : 77.62  Temp (24hrs), Avg:98.7 F (37.1 C), Min:97.7 F (36.5 C), Max:99.7 F (37.6 C)  Recent Labs  Lab 09/05/23 0522 09/06/23 0648 09/07/23 0509 09/08/23 0443 09/09/23 0436 09/10/23 0623  09/11/23 0430  WBC 7.3 9.3  --   --  4.2 6.2 5.4  CREATININE 1.49* 1.36* 1.18 1.05 1.09 0.97 0.98    Estimated Creatinine Clearance: 97.6 mL/min (by C-G formula based on SCr of 0.98 mg/dL).    Allergies  Allergen Reactions   Morphine And Codeine Anaphylaxis   Shellfish Allergy Anaphylaxis   Betadine  [Povidone Iodine ] Itching   Bupropion Other (See Comments)    Shaking of the body, hallucinations    Chlorhexidine  Hives    Patient reports never had issues CHG with mouth rinse.   Influenza Vaccines Hives   Metoprolol  Rash    Thank you for allowing pharmacy to be a part of this patient's care.  Almarie Lunger, PharmD, BCPS, BCIDP Infectious Diseases Clinical Pharmacist 09/11/2023 11:45 AM   **Pharmacist phone directory can now be found on [email or web address] ([email or web address]).  Listed under [facility].

## 2023-09-11 NOTE — Progress Notes (Signed)
 This is fantastic news that he is off dialysis now.  I am so happy for him.  Everything does seem to be working well.  Hopefully, he will be able to swallow better and get regular food.  He still has the G-tube in.  His labs show sodium 126.  Potassium 4.0.  BUN 65 creatinine 0.98.  Calcium  9.6 with an albumin  of 2.2.  His white cell count is 5.4.  Hemoglobin 9.9.  Platelet count 241,000.  Again, the new regimen really has helped.  His myeloma proteins have come down so nicely.  Hopefully, this will continue to improve and his renal function will continue to stabilize.  I am still not sure what the plan is for his discharge.  I would have to think that he has been going to some kind of Rehab facility.  It would be nice for him to be able to have regular food.  I think this will eventually happen.  Vital signs show temperature 98.6.  Pulse 92.  Blood pressure 170/94.  His head and neck exam does not show any oral lesions.  The tracheotomy site is dressed.  He has decent breath sounds bilaterally.  He has no rhonchi or wheezes.  Cardiac exam regular rate and rhythm.  There are no murmurs.  Abdomen is soft.  Still has a G-tube in.  Has bowel sounds are present.  There is no fluid wave.  Extremities shows a brace on the left upper arm.  His lower legs shows maybe some trace edema.  Again, Mr. Groman is finally getting to the point where he can be discharged I would think.  His myeloma is being treated.  He will get treatment today.  We will just give him Sarclisa  today.  Again, I am not sure with the discharge plans are for him.  It is obvious that he has had incredible care from all the staff on [facility].  I must give the staff a lot of credit for getting him so much better.   Jeralyn Crease, MD  Herlene 1:37

## 2023-09-11 NOTE — Progress Notes (Signed)
  Progress Note   Patient: Frank [name]. FMW:[id number] DOB: [birth year] DOA: 07/03/2023     70 DOS: the patient was seen and examined on 09/11/2023 at 9:19AM      Brief hospital course: 58 y.o. M with MM, pathological L humerus fracture, AF not on [facility] admitted for sepsis, dCHF, CAD, encephalopathy and renal failure.        Assessment and Plan: Sepsis This was initial presenting complaint, he was treated with antibiotics this is now resolved.  Multiple myeloma - Continue acyclovir  - Chemo per hematology  Aspergillosis with pneumonia - Continue voriconazole   Hyponatremia Urine sodium very low.  Clinically his volume status is hard to assess, but not particularly overloaded. - IV fluids and trend Na -Continue Paxil  for now  Coronary artery disease without angina Chronic diastolic congestive heart failure Hypertension Blood pressure low normal.  Appears euvolemic to dehydrated.  No active chest pain. - Hold spironolactone , lisinopril , HCTZ, Lasix , aspirin , verapamil , hydralazine   Diabetes Glucose well-controlled - Continue glargine - Continue sliding scale corrections  Pathologic left humerus fracture Discussed with orthopedics today.  Options would involve either a substantial reconstruction with antibiotic cement, prolonged recovery, or above elbow amputation.  This should be deferred to the outpatient setting - Follow-up with [name] in the office  Paroxysmal atrial fibrillation Not on anticoagulation due to thigh hematoma  CKD stage IIIa Resolved renal failure Cr stable  Chronic asthma No active flareup - Continue Brovana , Yupelri , Singulair   Anxiety/bipolar disorder -Continue Paxil  - Continue Depacon   Right thigh hematoma  Severe protein calorie malnutrition Class I obesity -Continue nutrition supplements, MVI  Normocytic anemia due to malignancy Hgb stable, no bleeding     Resolved issues Acute respiratory failure with hypoxia Acute  metabolic encephalopathy         Subjective: Patient complains of pain in his left arm.  Otherwise his mentation is good.  He has no specific complaints.  He is tired.  He has no respiratory symptoms, no fever, nursing of no concerns other than his left arm pain.     Physical Exam: BP (!) 111/91 (BP Location: Right Arm)   Pulse 88   Temp 98.3 F (36.8 C) (Oral)   Resp 16   Ht 6' 0.01 (1.829 m)   Wt 96 kg   SpO2 100%   BMI 28.70 kg/m   Adult male, lying in bed, appears debilitated and weak, but alert and responsive to questions RRR, no murmurs, no pitting in the extremities Left arm out of splint, it is deformed and has some swelling, not significantly different from yesterday Respiratory rate normal, lung sounds overall diminished, no rales or wheezes Abdomen soft, no tenderness to palpation Attention normal, affect appropriate, judgment and insight appear normal, no significant short-term memory impairment, severe generalized weakness    Data Reviewed: Discussed with orthopedics Basic metabolic panel notable for hyponatremia, elevated BUN to creatinine ratio CBC shows persistent anemia     Family Communication:     Disposition: Status is: Inpatient         Author: Lonni SHAUNNA Dalton, MD 09/11/2023 11:49 AM  For on call review [email or web address].

## 2023-09-11 NOTE — TOC Progression Note (Signed)
 Transition of Care (TOC) - Progression Note    Patient Details  Name: Frank [name]. MRN: [id number] Date of Birth: 05-Jul-1965  Transition of [facility]) CM/SW Contact  Almarie CHRISTELLA Goodie, [place] Phone Number: 09/11/2023, 1:18 PM  Clinical Narrative:   CSW coordinated with RN and OT assigned to patient today, patient is unable to sit up in chair, would not be able to tolerate wheelchair fleeta transport or an outpatient chemo session at this time. CSW will continue to follow.    Expected Discharge Plan: Skilled Nursing Facility Barriers to Discharge: Continued Medical Work up  Expected Discharge Plan and Services In-house Referral: Clinical Social Work Discharge Planning Services: CM Consult Post Acute Care Choice: Skilled Nursing Facility Living arrangements for the past 2 months: Single Family Home                 DME Arranged: N/A DME Agency: NA       HH Arranged: NA HH Agency: NA         Social Determinants of Health (SDOH) Interventions SDOH Screenings   Food Insecurity: No Food Insecurity (07/05/2023)  Housing: Low Risk  (07/05/2023)  Transportation Needs: No Transportation Needs (07/05/2023)  Utilities: Not At Risk (07/05/2023)  Tobacco Use: High Risk (08/30/2023)    Readmission Risk Interventions     No data to display

## 2023-09-12 DIAGNOSIS — C9000 Multiple myeloma not having achieved remission: Secondary | ICD-10-CM | POA: Diagnosis not present

## 2023-09-12 LAB — CBC WITH DIFFERENTIAL/PLATELET
Abs Immature Granulocytes: 0.45 10*3/uL — ABNORMAL HIGH (ref 0.00–0.07)
Basophils Absolute: 0 10*3/uL (ref 0.0–0.1)
Basophils Relative: 1 %
Eosinophils Absolute: 0 10*3/uL (ref 0.0–0.5)
Eosinophils Relative: 0 %
HCT: 28.2 % — ABNORMAL LOW (ref 39.0–52.0)
Hemoglobin: 9.4 g/dL — ABNORMAL LOW (ref 13.0–17.0)
Immature Granulocytes: 8 %
Lymphocytes Relative: 14 %
Lymphs Abs: 0.8 10*3/uL (ref 0.7–4.0)
MCH: 35.9 pg — ABNORMAL HIGH (ref 26.0–34.0)
MCHC: 33.3 g/dL (ref 30.0–36.0)
MCV: 107.6 fL — ABNORMAL HIGH (ref 80.0–100.0)
Monocytes Absolute: 0.8 10*3/uL (ref 0.1–1.0)
Monocytes Relative: 14 %
Neutro Abs: 3.7 10*3/uL (ref 1.7–7.7)
Neutrophils Relative %: 63 %
Platelets: 216 10*3/uL (ref 150–400)
RBC: 2.62 MIL/uL — ABNORMAL LOW (ref 4.22–5.81)
RDW: 25 % — ABNORMAL HIGH (ref 11.5–15.5)
Smear Review: NORMAL
WBC Morphology: INCREASED
WBC: 5.8 10*3/uL (ref 4.0–10.5)
nRBC: 0 % (ref 0.0–0.2)

## 2023-09-12 LAB — RENAL FUNCTION PANEL
Albumin: 2 g/dL — ABNORMAL LOW (ref 3.5–5.0)
Anion gap: 11 (ref 5–15)
BUN: 60 mg/dL — ABNORMAL HIGH (ref 6–20)
CO2: 20 mmol/L — ABNORMAL LOW (ref 22–32)
Calcium: 8.6 mg/dL — ABNORMAL LOW (ref 8.9–10.3)
Chloride: 98 mmol/L (ref 98–111)
Creatinine, Ser: 1.01 mg/dL (ref 0.61–1.24)
GFR, Estimated: >60 mL/min (ref >60)
Glucose, Bld: 98 mg/dL (ref 70–99)
Phosphorus: 4.2 mg/dL (ref 2.5–4.6)
Potassium: 3.9 mmol/L (ref 3.5–5.1)
Sodium: 129 mmol/L — ABNORMAL LOW (ref 135–145)

## 2023-09-12 LAB — GLUCOSE, CAPILLARY
Glucose-Capillary: 82 mg/dL (ref 70–99)
Glucose-Capillary: 147 mg/dL — ABNORMAL HIGH (ref 70–99)
Glucose-Capillary: 222 mg/dL — ABNORMAL HIGH (ref 70–99)
Glucose-Capillary: 270 mg/dL — ABNORMAL HIGH (ref 70–99)
Glucose-Capillary: 277 mg/dL — ABNORMAL HIGH (ref 70–99)

## 2023-09-12 LAB — COMPREHENSIVE METABOLIC PANEL WITH GFR
ALT: 12 U/L (ref 0–44)
AST: 19 U/L (ref 15–41)
Albumin: 2 g/dL — ABNORMAL LOW (ref 3.5–5.0)
Alkaline Phosphatase: 113 U/L (ref 38–126)
Anion gap: 9 (ref 5–15)
BUN: 60 mg/dL — ABNORMAL HIGH (ref 6–20)
CO2: 24 mmol/L (ref 22–32)
Calcium: 8.7 mg/dL — ABNORMAL LOW (ref 8.9–10.3)
Chloride: 97 mmol/L — ABNORMAL LOW (ref 98–111)
Creatinine, Ser: 1.06 mg/dL (ref 0.61–1.24)
GFR, Estimated: >60 mL/min (ref >60)
Glucose, Bld: 101 mg/dL — ABNORMAL HIGH (ref 70–99)
Potassium: 3.9 mmol/L (ref 3.5–5.1)
Sodium: 130 mmol/L — ABNORMAL LOW (ref 135–145)
Total Bilirubin: 0.3 mg/dL (ref 0.0–1.2)
Total Protein: 4.9 g/dL — ABNORMAL LOW (ref 6.5–8.1)

## 2023-09-12 LAB — LACTATE DEHYDROGENASE: LDH: 201 U/L — ABNORMAL HIGH (ref 98–192)

## 2023-09-12 MED ORDER — DIPHENHYDRAMINE HCL 50 MG/ML IJ SOLN
50.0000 mg | Freq: Once | INTRAMUSCULAR | Status: AC
  Administered 2023-09-12: 50 mg via INTRAVENOUS
  Filled 2023-09-12: qty 1

## 2023-09-12 MED ORDER — MONTELUKAST SODIUM 10 MG PO TABS
10.0000 mg | ORAL_TABLET | Freq: Once | ORAL | Status: AC
  Administered 2023-09-12: 10 mg via ORAL
  Filled 2023-09-12: qty 1

## 2023-09-12 MED ORDER — ACETAMINOPHEN 325 MG PO TABS
650.0000 mg | ORAL_TABLET | Freq: Once | ORAL | Status: AC
  Administered 2023-09-12: 650 mg via ORAL
  Filled 2023-09-12: qty 2

## 2023-09-12 MED ORDER — POLYETHYLENE GLYCOL 3350 17 G PO PACK
17.0000 g | PACK | Freq: Two times a day (BID) | ORAL | Status: DC
  Administered 2023-09-12 – 2023-10-24 (×34): 17 g via ORAL
  Filled 2023-09-12 (×60): qty 1

## 2023-09-12 MED ORDER — ADULT MULTIVITAMIN W/MINERALS CH
1.0000 | ORAL_TABLET | Freq: Every day | ORAL | Status: DC
  Administered 2023-09-12 – 2023-09-19 (×8): 1
  Filled 2023-09-12 (×8): qty 1

## 2023-09-12 MED ORDER — FAMOTIDINE IN NACL 20-0.9 MG/50ML-% IV SOLN
20.0000 mg | Freq: Once | INTRAVENOUS | Status: AC
  Administered 2023-09-12: 20 mg via INTRAVENOUS
  Filled 2023-09-12: qty 50

## 2023-09-12 MED ORDER — SODIUM CHLORIDE 0.9 % IV SOLN
20.0000 mg | Freq: Once | INTRAVENOUS | Status: AC
  Administered 2023-09-12: 20 mg via INTRAVENOUS
  Filled 2023-09-12: qty 2

## 2023-09-12 MED ORDER — SODIUM CHLORIDE 0.9 % IV SOLN
1000.0000 mg | Freq: Once | INTRAVENOUS | Status: AC
  Administered 2023-09-12: 1000 mg via INTRAVENOUS
  Filled 2023-09-12: qty 50

## 2023-09-12 NOTE — Plan of Care (Signed)
  Problem: Education: Goal: Knowledge of General Education information will improve Description: Including pain rating scale, medication(s)/side effects and non-pharmacologic comfort measures Outcome: Progressing   Problem: Clinical Measurements: Goal: Respiratory complications will improve Outcome: Progressing Goal: Cardiovascular complication will be avoided Outcome: Progressing   Problem: Activity: Goal: Risk for activity intolerance will decrease Outcome: Progressing   Problem: Nutrition: Goal: Adequate nutrition will be maintained Outcome: Progressing   Problem: Elimination: Goal: Will not experience complications related to urinary retention Outcome: Progressing   Problem: Skin Integrity: Goal: Risk for impaired skin integrity will decrease Outcome: Progressing   Problem: Fluid Volume: Goal: Ability to maintain a balanced intake and output will improve Outcome: Progressing   Problem: Nutritional: Goal: Maintenance of adequate nutrition will improve Outcome: Progressing   Problem: Skin Integrity: Goal: Risk for impaired skin integrity will decrease Outcome: Progressing

## 2023-09-12 NOTE — TOC Progression Note (Signed)
 Transition of Care (TOC) - Progression Note    Patient Details  Name: Frank [name]. MRN: [id number] Date of Birth: 1965-05-23  Transition of Care [facility]) CM/SW Contact  Almarie CHRISTELLA Goodie, [place] Phone Number: 09/12/2023, 3:32 PM  Clinical Narrative:   CSW received call from spouse, [name], that when the patient is ready for SNF, she would prefer [facility] as her first choice. [name] said she has a friend that told her about another SNF in [place], but she is not sure the name; she will get the name and update CSW. Newell also asking about the patient's arm and wondering if he needs an amputation. CSW relayed spouse's concern to MD. CSW to follow.    Expected Discharge Plan: Skilled Nursing Facility Barriers to Discharge: Continued Medical Work up  Expected Discharge Plan and Services In-house Referral: Clinical Social Work Discharge Planning Services: CM Consult Post Acute Care Choice: Skilled Nursing Facility Living arrangements for the past 2 months: Single Family Home                 DME Arranged: N/A DME Agency: NA       HH Arranged: NA HH Agency: NA         Social Determinants of Health (SDOH) Interventions SDOH Screenings   Food Insecurity: No Food Insecurity (07/05/2023)  Housing: Low Risk  (07/05/2023)  Transportation Needs: No Transportation Needs (07/05/2023)  Utilities: Not At Risk (07/05/2023)  Tobacco Use: High Risk (08/30/2023)    Readmission Risk Interventions     No data to display

## 2023-09-12 NOTE — Progress Notes (Signed)
 Patient has had weight loss since last treatment. Decreasing Sarclisa  dose to 1000 mg per Dr. Jessy instructions.

## 2023-09-12 NOTE — Progress Notes (Signed)
 Speech Language Pathology Treatment: Dysphagia  Patient Details Name: Frank [name]. MRN: [id number] DOB: 08-Jan-1966 Today's Date: 09/12/2023 Time: 8648-8596 SLP Time Calculation (min) (ACUTE ONLY): 12 min  Assessment / Plan / Recommendation Clinical Impression  Pt seen for ongoing dysphagia.  RN present during session. Frank [name] was working on his lunch. Consumed limited bites of puree, alternating with clear liquids with occasional cues needed.  Today there were no overt concerns for aspiration.  Breath sounds have been stable per chart review. No repeat chest imaging has been necessary since 6/7.  He appears to be tolerating his diet - despite dysphagia- without developing adverse consequences.    He was willing to use his EMST for two trials, then stating he needed rest. It was left on his tray table and he was encouraged to use it intermittently throughout the day.   Progress with swallowing is slow, but he appears to have been stable in this regard for the last few days.  SLP will continue to follow.     HPI HPI: 58 y.o. male transferred from [facility] 07/03/23 to [facility] for management of newly diagnosed plasma cell myeloma with weakness and AMS. Pt also with AKI and metabolic encephalopathy. 5/10 transfer to [facility] for iHD.Bedside swallow eval 4/18 with recs for NPO due to somnolence. 4/22 Intubated and chemo initiated. 4/23-5/10 CRRT. 5/6 IVIG started. 5/8 trach. 5/12 iHD initiated. 5/18 inability to oxygenate through trach, intubated and trach revision. Subsequently decannulated. PEG. PMHx:Lt THA, carpal tunnel syndrome, Lt TKA, Lt humerus fx, PAF, HTN, HLD, bipolar disorder, obesity, T2DM.      SLP Plan  Continue with current plan of care          Recommendations  Diet recommendations: Dysphagia 1 (puree);Thin liquid (clear liquids) Liquids provided via: Cup;Straw Medication Administration: Via alternative means Supervision: Patient able to self feed;Full supervision/cueing  for compensatory strategies Compensations: Slow rate;Small sips/bites;Follow solids with liquid;Clear throat after each swallow;Effortful swallow Postural Changes and/or Swallow Maneuvers: Seated upright 90 degrees;Upright 30-60 min after meal                  Oral care BID     Dysphagia, oropharyngeal phase (R13.12)     Continue with current plan of care    Frank [name] L. Vona, MA CCC/SLP Clinical Specialist - Acute Care SLP Acute Rehabilitation Services Office number [phone]  Frank [name]  09/12/2023, 2:16 PM

## 2023-09-12 NOTE — Progress Notes (Signed)
 Pt refusing all morning labs. Hospitalist on-call was made aware. Lab was asked to try at a later time.

## 2023-09-12 NOTE — Progress Notes (Signed)
 Mr. Morgenstern tolerated his Sarclisa  treatment today with no issues. Stable throught treatment VS Normal. Resting in his bed.

## 2023-09-12 NOTE — Progress Notes (Signed)
  Progress Note   Patient: Frank [name]. FMW:[id number] DOB: 22-Dec-1965 DOA: 07/03/2023     71 DOS: the patient was seen and examined on 09/12/2023        Brief hospital course: 58 y.o. M with MM, pathological L humerus fracture, AF not on [name] admitted for sepsis, dCHF, CAD, encephalopathy and renal failure.        Assessment and Plan: Sepsis This was initial presenting complaint, he was treated with antibiotics this is now resolved.   Multiple myeloma - Continue acyclovir  - Chemo per hematology   Aspergillosis with pneumonia - Continue voriconazole  until 8/5, dosing per ID RPh   Hyponatremia Una low.  Serum Na improved with fluids. -Continue Paxil  for now   Coronary artery disease without angina Chronic diastolic congestive heart failure Hypertension Blood pressure low normal.  Appears euvolemic to dehydrated.  No active chest pain. - Hold spironolactone , lisinopril , HCTZ, Lasix , aspirin , verapamil , hydralazine    Diabetes Glucose well-controlled - Continue glargine - Continue sliding scale corrections   Pathologic left humerus fracture Discussed with orthopedics today.  Options would involve either a substantial reconstruction with antibiotic cement, prolonged recovery, or above elbow amputation.  This should be deferred to the outpatient setting - Follow-up with [name] in the office   Paroxysmal atrial fibrillation Not on anticoagulation due to thigh hematoma   CKD stage IIIa Resolved renal failure Cr stable   Chronic asthma No active flareup - Continue Brovana , Yupelri , Singulair    Anxiety/bipolar disorder -Continue Paxil  - Continue Depacon    Right thigh hematoma   Severe protein calorie malnutrition Class I obesity -Continue nutrition supplements, MVI   Normocytic anemia due to malignancy Hgb stable, no bleeding         Subjective: No new complaints.  No fever, no respiratory symptoms.  Getting chemo today.     Physical  Exam: BP (!) 129/99 (BP Location: Right Arm)   Pulse 91   Temp 98.7 F (37.1 C) (Oral)   Resp 19   Ht 6' 0.01 (1.829 m)   Wt 96 kg   SpO2 94%   BMI 28.70 kg/m   Deconditioned, malnourished adult male, lying in bed, sleeping, opens eyes briefly, is alert and responsive to questions, then falls back asleep RRR, no murmurs, no pitting in the extremities Left arm in splint, deformed, no significant swelling Respiratory rate normal, lungs diminished, no rales or wheezes Abdomen soft, no tenderness to palpation Attention sleepy, affect appropriate when awake, falls back asleep, judgment and insight appear normal      Data Reviewed: Sodium up to 130, BUN elevated, creatinine normal, hemoglobin normal    Family Communication:     Disposition: Status is: Inpatient The patient was admitted for sepsis and renal failure.  These have now resolved.  He is unfortunately too weak to sit up and so is not appropriate for outpatient chemotherapy.  He will have to either get stronger enough (in the hospital) to sit up for outpatient chemo, complete chemo, or pause chemo long enough to rehab to be able to sit up.              Author: Lonni SHAUNNA Dalton, MD 09/12/2023 3:37 PM  For on call review [email or web address].

## 2023-09-12 NOTE — Progress Notes (Signed)
 Nutrition Follow-up  DOCUMENTATION CODES:  Severe malnutrition in context of acute illness/injury  INTERVENTION:  Continue current diet as ordered Encourage PO intake Continue bolus tube feeding via PEG: 6 cartons of Nepro 1.8 per day (1.5 cartons x 4 feeds per day) Will ensure there is a 2 hour break for voriconazole  administration BID (0900-1100, 2100-2300) Prosource TF20 60mL 1x/d Flush with 30mL before and after each blus feed (60mL 4x/d) This will provide 2600 kcal, 134g of protein, and of free water  ( TF+flush) Continue Juven 1 packet BID via tube, each packet provides 95 calories, 2.5 grams of protein (collagen) + micronutrients to support wound healing MVI with minerals daily Kcal count thru the weekend to determine if PO intake warrants a change in bolus amount   NUTRITION DIAGNOSIS:  Severe Malnutrition related to acute illness (prolonged illness, interuptions in feeding) as evidenced by moderate fat depletion, moderate muscle depletion, percent weight loss (9.6% x 1 month). - remains applicable  GOAL:  Patient will meet greater than or equal to 90% of their needs - met with TF  MONITOR:  TF tolerance, I & O's, Labs, Weight trends  REASON FOR ASSESSMENT:  Consult Assessment of nutrition requirement/status  ASSESSMENT:  58 y.o. male with PMH of obesity, asthma, OSA on CPAP, GERD, HTN, HLD who presented due to feeling weak with AMS for a few days. Admitted with concern for sepsis, community-acquired pneumonia, encephalopathy as well as concern for multiple myeloma.  4/17 Admit to [facility] 4/21 SLP eval - NPO; IR attempted Cortrak but unable to be placed; CCM MD successfully placed NGT 4/22 Concern for aspiration overnight; Intubated 4/23 - CRRT 5/8 - tracheostomy 5/10 - CRRT discontinued 5/11 - transferred to [facility] 5/12 - iHD initiated  5/29 - PEG placed 5/31 - chemotherapy changed to Sarclisa /Kyprolis /Cytoxan  6/9 - MBS, NPO recommended   6/11 - trach removed 6/13 - BSE, NPO, tunneled HD catheter removed for line holiday 6/16 - Tunneled HD replaced,  6/18 - FEES, thin liquids when supervised 6/19, 6/21 - BSE, clear liquids 6/23 - MBS, clear liquids, pt not able to full participate in study due to pain; no HD since 6/16, line pulled pt showing signs of renal recovery 6/24 - FEES, thin liquids and puree ok  Pt able to be advanced to a DYS 1 diet and thin liquids. Consistently seems to be eating some at each meal. Will start a kcal count to determine if amount of cartons pt is receiving daily can be reduced. Will also re-evaluate formula now that pt is not requiring dialysis. Sodium has been consistently low so may benefit from a higher sodium formulation. Currently on fluid restriction and with >5L of urine out in the last 24 hours.  Admit weight: 110.6 kg (4/17) Current weight: 96 kg (6/24) - weight was taken after bed scale was zeroed, accurate   Intake/Output Summary (Last 24 hours) at 09/12/2023 1237 Last data filed at 09/12/2023 0823 Gross per 24 hour  Intake --  Output 4500 ml  Net -4500 ml  Net IO Since Admission: -21,117.87 mL [09/12/23 1237]  Drains/Lines: PEG 20 Fr. LUQ UOP 5,05mL x 24 hours  Nutrition Related Medications:  Scheduled Meds:  NEPRO CARB STEADY  355 mL Per Tube TID PC & HS   PROSource TF20  60 mL Per Tube Daily   insulin  aspart  0-15 Units Subcutaneous TID AC & HS   insulin  aspart  2 Units Subcutaneous TID AC & HS   insulin  glargine-yfgn  15  Units Subcutaneous Q24H   multivitamin  1 tablet Oral QHS   JUVEN  1 packet Oral BID BM   polyethylene glycol  17 g Oral BID   senna  1 tablet Per Tube Daily   voriconazole   350 mg Oral Q12H   Continuous Infusions:  sodium chloride  100 mL/hr at 09/12/23 0835   PRN Meds: ondansetron   Labs Reviewed: Sodium 129 BUN 60 CBG ranges from 82-157 mg/dL over the last 24 hours HgbA1c 6.0%  Micronutrient Labs: CRP 1.5  Vitamin A  42.5 (WNL) Zinc  73  (WNL) Folate 17.8 (WNL)  Diet Order:   Diet Order             DIET - DYS 1 Room service appropriate? No; Fluid consistency: Thin; Fluid restriction: 1200 mL Fluid  Diet effective now                  EDUCATION NEEDS:  Not appropriate for education at this time  Skin: Skin Integrity Issues Per WOC note 6/23 Unstageable  - sacrum 5 cm x 4 cm  Stage 3:  - R Buttocks 6 cm x 4cm  Stage 2: - L buttocks 1 cm x 1 cm   Last BM:  6/27  Height:  Ht Readings from Last 1 Encounters:  07/22/23 6' 0.01 (1.829 m)   Weight:  Wt Readings from Last 1 Encounters:  09/09/23 96 kg   Ideal Body Weight:  80.9 kg  BMI:  Body mass index is 28.7 kg/m.  Estimated Nutritional Needs:  Kcal:  2400-2700 kcal/d Protein:  130-150g/d Fluid:  1L+UOP   Vernell Lukes, RD, LDN Registered Dietitian II Please reach out via secure chat

## 2023-09-12 NOTE — Progress Notes (Signed)
 Overall, Mr. Trull is about the same.  He is getting physical therapy.  He is incredibly deconditioned.  I will think this will improve over time and with better nutrition.  He will get treatment today but just with the monoclonal antibody-Sarclisa .  His labs should be okay for Sarclisa .  He is having some pain issues.  I suspect this probably is just from him being in bed all the time.  He is off dialysis.  His renal function seems to be doing fairly well.  He is making urine.  There is no bleeding.  He has had no nausea or vomiting.  I would like to think that he will be in a position to be able to be discharged at some point in the near future.  I am sure that he can get treatment for the myeloma closer to home if he has to go to rehab or skilled nursing up in [place] .  I note this has been incredibly challenging.  He has been in the hospital for probably 3 months at this point.  He has had no fever.  Hopefully, all infections have been cleared out.  I know that it is very challenging to treat him in the hospital.  I do appreciate everybody's efforts to treat him for the myeloma.  The treatment has clearly been successful with his levels coming down so nicely.  Jeralyn Crease, MD   Inge 29:11

## 2023-09-13 DIAGNOSIS — C9000 Multiple myeloma not having achieved remission: Secondary | ICD-10-CM | POA: Diagnosis not present

## 2023-09-13 LAB — CBC WITH DIFFERENTIAL/PLATELET
Abs Immature Granulocytes: 0.21 10*3/uL — ABNORMAL HIGH (ref 0.00–0.07)
Basophils Absolute: 0 10*3/uL (ref 0.0–0.1)
Basophils Relative: 0 %
Eosinophils Absolute: 0 10*3/uL (ref 0.0–0.5)
Eosinophils Relative: 0 %
HCT: 27.2 % — ABNORMAL LOW (ref 39.0–52.0)
Hemoglobin: 8.8 g/dL — ABNORMAL LOW (ref 13.0–17.0)
Immature Granulocytes: 4 %
Lymphocytes Relative: 21 %
Lymphs Abs: 1.1 10*3/uL (ref 0.7–4.0)
MCH: 35.3 pg — ABNORMAL HIGH (ref 26.0–34.0)
MCHC: 32.4 g/dL (ref 30.0–36.0)
MCV: 109.2 fL — ABNORMAL HIGH (ref 80.0–100.0)
Monocytes Absolute: 0.6 10*3/uL (ref 0.1–1.0)
Monocytes Relative: 12 %
Neutro Abs: 3.2 10*3/uL (ref 1.7–7.7)
Neutrophils Relative %: 63 %
Platelets: 221 10*3/uL (ref 150–400)
RBC: 2.49 MIL/uL — ABNORMAL LOW (ref 4.22–5.81)
RDW: 24.6 % — ABNORMAL HIGH (ref 11.5–15.5)
Smear Review: NORMAL
WBC: 5.1 10*3/uL (ref 4.0–10.5)
nRBC: 0 % (ref 0.0–0.2)

## 2023-09-13 LAB — GLUCOSE, CAPILLARY
Glucose-Capillary: 126 mg/dL — ABNORMAL HIGH (ref 70–99)
Glucose-Capillary: 171 mg/dL — ABNORMAL HIGH (ref 70–99)
Glucose-Capillary: 188 mg/dL — ABNORMAL HIGH (ref 70–99)
Glucose-Capillary: 221 mg/dL — ABNORMAL HIGH (ref 70–99)

## 2023-09-13 LAB — RENAL FUNCTION PANEL
Albumin: 1.9 g/dL — ABNORMAL LOW (ref 3.5–5.0)
Anion gap: 11 (ref 5–15)
BUN: 68 mg/dL — ABNORMAL HIGH (ref 6–20)
CO2: 24 mmol/L (ref 22–32)
Calcium: 8.5 mg/dL — ABNORMAL LOW (ref 8.9–10.3)
Chloride: 95 mmol/L — ABNORMAL LOW (ref 98–111)
Creatinine, Ser: 1.07 mg/dL (ref 0.61–1.24)
GFR, Estimated: >60 mL/min (ref >60)
Glucose, Bld: 197 mg/dL — ABNORMAL HIGH (ref 70–99)
Phosphorus: 3.3 mg/dL (ref 2.5–4.6)
Potassium: 4.1 mmol/L (ref 3.5–5.1)
Sodium: 130 mmol/L — ABNORMAL LOW (ref 135–145)

## 2023-09-13 NOTE — Plan of Care (Signed)
  Problem: Education: Goal: Knowledge of General Education information will improve Description: Including pain rating scale, medication(s)/side effects and non-pharmacologic comfort measures Outcome: Progressing   Problem: Health Behavior/Discharge Planning: Goal: Ability to manage health-related needs will improve Outcome: Progressing   Problem: Clinical Measurements: Goal: Ability to maintain clinical measurements within normal limits will improve Outcome: Progressing Goal: Will remain free from infection Outcome: Progressing Goal: Diagnostic test results will improve Outcome: Progressing Goal: Respiratory complications will improve Outcome: Progressing Goal: Cardiovascular complication will be avoided Outcome: Progressing   Problem: Activity: Goal: Risk for activity intolerance will decrease Outcome: Progressing   Problem: Nutrition: Goal: Adequate nutrition will be maintained Outcome: Progressing   Problem: Coping: Goal: Level of anxiety will decrease Outcome: Progressing   Problem: Elimination: Goal: Will not experience complications related to bowel motility Outcome: Progressing Goal: Will not experience complications related to urinary retention Outcome: Progressing   Problem: Pain Managment: Goal: General experience of comfort will improve and/or be controlled Outcome: Progressing   Problem: Safety: Goal: Ability to remain free from injury will improve Outcome: Progressing   Problem: Skin Integrity: Goal: Risk for impaired skin integrity will decrease Outcome: Progressing   Problem: Education: Goal: Ability to describe self-care measures that may prevent or decrease complications (Diabetes Survival Skills Education) will improve Outcome: Progressing Goal: Individualized Educational Video(s) Outcome: Progressing   Problem: Coping: Goal: Ability to adjust to condition or change in health will improve Outcome: Progressing   Problem: Fluid  Volume: Goal: Ability to maintain a balanced intake and output will improve Outcome: Progressing   Problem: Health Behavior/Discharge Planning: Goal: Ability to identify and utilize available resources and services will improve Outcome: Progressing Goal: Ability to manage health-related needs will improve Outcome: Progressing   Problem: Metabolic: Goal: Ability to maintain appropriate glucose levels will improve Outcome: Progressing   Problem: Nutritional: Goal: Maintenance of adequate nutrition will improve Outcome: Progressing Goal: Progress toward achieving an optimal weight will improve Outcome: Progressing   Problem: Skin Integrity: Goal: Risk for impaired skin integrity will decrease Outcome: Progressing   Problem: Tissue Perfusion: Goal: Adequacy of tissue perfusion will improve Outcome: Progressing   Problem: Education: Goal: Knowledge about tracheostomy care/management will improve Outcome: Progressing   Problem: Activity: Goal: Ability to tolerate increased activity will improve Outcome: Progressing   Problem: Health Behavior/Discharge Planning: Goal: Ability to manage tracheostomy will improve Outcome: Progressing   Problem: Respiratory: Goal: Patent airway maintenance will improve Outcome: Progressing   Problem: Role Relationship: Goal: Ability to communicate will improve Outcome: Progressing   Problem: Education: Goal: Knowledge of disease and its progression will improve Outcome: Progressing Goal: Individualized Educational Video(s) Outcome: Progressing   Problem: Fluid Volume: Goal: Compliance with measures to maintain balanced fluid volume will improve Outcome: Progressing   Problem: Health Behavior/Discharge Planning: Goal: Ability to manage health-related needs will improve Outcome: Progressing   Problem: Nutritional: Goal: Ability to make healthy dietary choices will improve Outcome: Progressing

## 2023-09-13 NOTE — Progress Notes (Signed)
  Progress Note   Patient: Frank [name]. FMW:[id number] DOB: 1965/08/28 DOA: 07/03/2023     72 DOS: the patient was seen and examined on 09/13/2023 at 10:50AM      Brief hospital course: 58 y.o. M with MM, pathological L humerus fracture, AF not on [facility] admitted for sepsis, dCHF, CAD, encephalopathy and renal failure.        Assessment and Plan: Sepsis This was initial presenting complaint, he was treated with antibiotics this is now resolved.   Multiple myeloma - Continue acyclovir  - Chemo per hematology   Aspergillosis with pneumonia - Continue voriconazole  until 8/5, dosing per ID RPh   Hypovolemic hyponatremia Una low.  Serum Na improved with fluids. Refused labs today -Continue Paxil      Coronary artery disease without angina Chronic diastolic congestive heart failure Hypertension BP controlled - Hold spironolactone , lisinopril , HCTZ, Lasix , aspirin , verapamil , hydralazine    Diabetes Glucose well-controlled - Continue glargine - Continue sliding scale corrections   Pathologic left humerus fracture Discussed with orthopedics today.  Options would involve either a substantial reconstruction with antibiotic cement, prolonged recovery, or above elbow amputation.  This should be deferred to the outpatient setting - Follow-up with [name] in the office   Paroxysmal atrial fibrillation Not on anticoagulation due to thigh hematoma   CKD stage IIIa Resolved renal failure Cr stable   Chronic asthma No active flareup - Continue Brovana , Yupelri , Singulair    Anxiety/bipolar disorder -Continue Paxil  - Continue Depacon    Right thigh hematoma   Severe protein calorie malnutrition Class I obesity -Continue nutrition supplements, MVI   Normocytic anemia due to malignancy Hgb stable, no bleeding         Subjective: No new complaints.  No fever, no respiratory symptoms.   Would like to sit up in chair     Physical Exam: BP 133/82 (BP Location:  Right Arm)   Pulse 84   Temp 98.2 F (36.8 C) (Axillary)   Resp 19   Ht 6' 0.01 (1.829 m)   Wt 100 kg   SpO2 97%   BMI 29.89 kg/m   Deconditioned, malnourished adult male, lying in bed, sleeping, Rouses, answers questions appropriately RRR, no murmurs, no pitting in the extremities Respiratory rate normal, lungs diminished, no rales or wheezes Abdomen soft, no tenderness to palpation       Data Reviewed: Refused labs today   Family Communication:     Disposition: Status is: Inpatient The patient was admitted for sepsis and renal failure.  These have now resolved.  He is unfortunately too weak to sit up and so is not appropriate for outpatient chemotherapy.  He will have to either get stronger enough (in the hospital) to sit up for outpatient chemo, complete chemo, or pause chemo long enough to rehab to be able to sit up.              Author: Lonni SHAUNNA Dalton, MD 09/13/2023 1:02 PM  For on call review [email or web address].

## 2023-09-13 NOTE — Plan of Care (Signed)
  Problem: Education: Goal: Knowledge of General Education information will improve Description: Including pain rating scale, medication(s)/side effects and non-pharmacologic comfort measures Outcome: Progressing   Problem: Clinical Measurements: Goal: Respiratory complications will improve Outcome: Progressing Goal: Cardiovascular complication will be avoided Outcome: Progressing   Problem: Nutrition: Goal: Adequate nutrition will be maintained Outcome: Progressing   Problem: Coping: Goal: Level of anxiety will decrease Outcome: Progressing   Problem: Elimination: Goal: Will not experience complications related to bowel motility Outcome: Progressing Goal: Will not experience complications related to urinary retention Outcome: Progressing   Problem: Skin Integrity: Goal: Risk for impaired skin integrity will decrease Outcome: Progressing

## 2023-09-14 DIAGNOSIS — F319 Bipolar disorder, unspecified: Secondary | ICD-10-CM | POA: Insufficient documentation

## 2023-09-14 DIAGNOSIS — D709 Neutropenia, unspecified: Secondary | ICD-10-CM | POA: Insufficient documentation

## 2023-09-14 DIAGNOSIS — J45909 Unspecified asthma, uncomplicated: Secondary | ICD-10-CM | POA: Insufficient documentation

## 2023-09-14 DIAGNOSIS — I2510 Atherosclerotic heart disease of native coronary artery without angina pectoris: Secondary | ICD-10-CM | POA: Insufficient documentation

## 2023-09-14 DIAGNOSIS — I5032 Chronic diastolic (congestive) heart failure: Secondary | ICD-10-CM | POA: Insufficient documentation

## 2023-09-14 DIAGNOSIS — C9000 Multiple myeloma not having achieved remission: Secondary | ICD-10-CM | POA: Diagnosis not present

## 2023-09-14 DIAGNOSIS — E66811 Obesity, class 1: Secondary | ICD-10-CM | POA: Insufficient documentation

## 2023-09-14 DIAGNOSIS — N183 Chronic kidney disease, stage 3 unspecified: Secondary | ICD-10-CM | POA: Insufficient documentation

## 2023-09-14 LAB — CBC WITH DIFFERENTIAL/PLATELET
Abs Immature Granulocytes: 0 10*3/uL (ref 0.00–0.07)
Basophils Absolute: 0 10*3/uL (ref 0.0–0.1)
Basophils Relative: 0 %
Eosinophils Absolute: 0 10*3/uL (ref 0.0–0.5)
Eosinophils Relative: 0 %
HCT: 26.3 % — ABNORMAL LOW (ref 39.0–52.0)
Hemoglobin: 8.5 g/dL — ABNORMAL LOW (ref 13.0–17.0)
Lymphocytes Relative: 7 %
Lymphs Abs: 0.4 10*3/uL — ABNORMAL LOW (ref 0.7–4.0)
MCH: 35.7 pg — ABNORMAL HIGH (ref 26.0–34.0)
MCHC: 32.3 g/dL (ref 30.0–36.0)
MCV: 110.5 fL — ABNORMAL HIGH (ref 80.0–100.0)
Monocytes Absolute: 0.4 10*3/uL (ref 0.1–1.0)
Monocytes Relative: 6 %
Neutro Abs: 5.4 10*3/uL (ref 1.7–7.7)
Neutrophils Relative %: 87 %
Platelets: 216 10*3/uL (ref 150–400)
RBC: 2.38 MIL/uL — ABNORMAL LOW (ref 4.22–5.81)
RDW: 25 % — ABNORMAL HIGH (ref 11.5–15.5)
WBC: 6.2 10*3/uL (ref 4.0–10.5)
nRBC: 0 % (ref 0.0–0.2)
nRBC: 0 /100{WBCs}

## 2023-09-14 LAB — GLUCOSE, CAPILLARY
Glucose-Capillary: 66 mg/dL — ABNORMAL LOW (ref 70–99)
Glucose-Capillary: 107 mg/dL — ABNORMAL HIGH (ref 70–99)
Glucose-Capillary: 146 mg/dL — ABNORMAL HIGH (ref 70–99)
Glucose-Capillary: 147 mg/dL — ABNORMAL HIGH (ref 70–99)
Glucose-Capillary: 126 mg/dL — ABNORMAL HIGH (ref 70–99)

## 2023-09-14 LAB — RENAL FUNCTION PANEL
Albumin: 1.9 g/dL — ABNORMAL LOW (ref 3.5–5.0)
Anion gap: 13 (ref 5–15)
BUN: 62 mg/dL — ABNORMAL HIGH (ref 6–20)
CO2: 21 mmol/L — ABNORMAL LOW (ref 22–32)
Calcium: 8 mg/dL — ABNORMAL LOW (ref 8.9–10.3)
Chloride: 96 mmol/L — ABNORMAL LOW (ref 98–111)
Creatinine, Ser: 1.09 mg/dL (ref 0.61–1.24)
GFR, Estimated: >60 mL/min (ref >60)
Glucose, Bld: 102 mg/dL — ABNORMAL HIGH (ref 70–99)
Phosphorus: 3.5 mg/dL (ref 2.5–4.6)
Potassium: 4.4 mmol/L (ref 3.5–5.1)
Sodium: 130 mmol/L — ABNORMAL LOW (ref 135–145)

## 2023-09-14 LAB — IGG, IGA, IGM
IgA: 64 mg/dL — ABNORMAL LOW (ref 90–386)
IgG (Immunoglobin G), Serum: 1470 mg/dL (ref 603–1613)
IgM (Immunoglobulin M), Srm: <5 mg/dL — ABNORMAL LOW (ref 20–172)

## 2023-09-14 NOTE — Progress Notes (Signed)
 Occupational Therapy Treatment Patient Details Name: Frank [name]. MRN: [id number] DOB: [birth year] Today's Date: 09/14/2023   History of present illness 58 y.o. male transferred from [facility] 07/03/23 to [facility] for management of newly diagnosed plasma cell myeloma with weakness and AMS. Pt also with AKI and metabolic encephalopathy. 5/10 transfer to [facility] for iHD. 4/22 Intubated and chemo initiated. 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 5/6 IVIG started. 5/8-6/11 trach. 5/12 iHD initiated. 5/29 PEG placed. 6/5 chemo initiated. PMHx:Lt THA, carpal tunnel syndrome, Lt TKA, Lt humerus fx, PAF, HTN, HLD, bipolar disorder, obesity, T2DM   OT comments  Pt. Seen for skilled OT treatment session.  Pt. Able to complete grooming task bed level with set up. Good use and control with RUE.  Min A for opening packages and set up for breakfast. But able to self feed with RUE no assistance.  RUE HEP with light cues for tech.  Pt. Tolerated well. Education on use of RUE for ADLs that he can assist with (UB bathing, grooming, self feeding) to cont. Increasing activity tolerance and strengthening.  Also daily completion of RUE HEP.  Pt. Verbalized understanding and remains motivated to get stronger.  Cont. With acute OT POC.         If plan is discharge home, recommend Frank following:  A lot of help with walking and/or transfers;Two people to help with walking and/or transfers;A lot of help with bathing/dressing/bathroom;Two people to help with bathing/dressing/bathroom;Assistance with cooking/housework;Assistance with feeding;Direct supervision/assist for medications management;Direct supervision/assist for financial management;Assist for transportation;Help with stairs or ramp for entrance   Equipment Recommendations       Recommendations for Other Services      Precautions / Restrictions Precautions Precautions: Fall Recall of Precautions/Restrictions: Impaired Precaution/Restrictions Comments: episode of  syncope, sacral wound, chemo Restrictions LUE Weight Bearing Per Provider Order: Non weight bearing Other Position/Activity Restrictions: Lt humerus non-union, splinted       Mobility Bed Mobility                    Transfers                         Balance                                           ADL either performed or assessed with clinical judgement   ADL Overall ADL's : Needs assistance/impaired Eating/Feeding: Minimal assistance;Bed level Eating/Feeding Details (indicate cue type and reason): set up for breakfast, only required assistance opening containers/tray set up. able to self feed with RUE without assist Grooming: Wash/dry face;Bed level;Set up                                      Extremity/Trunk Assessment              Vision       Perception     Praxis     Communication     Cognition Arousal: Alert Behavior During Therapy: WFL for tasks assessed/performed               OT - Cognition Comments: Follows simple one step commands. cognition needs further assessing.  Following commands: Impaired Following commands impaired: Follows one step commands with increased time      Cueing   Cueing Techniques: Verbal cues  Exercises General Exercises - Upper Extremity Shoulder Flexion: AROM, 5 reps, Right, Seated Elbow Flexion: AROM, Right, 10 reps, Supine Elbow Extension: AROM, Right, 10 reps, Supine Wrist Flexion: AROM, Right, 5 reps, Supine Wrist Extension: AROM, Right, 5 reps, Supine Digit Composite Flexion: AROM, Right, 5 reps, Supine Composite Extension: AROM, Right, 5 reps, Supine    Shoulder Instructions       General Comments  58 year old Frank [name], and another grandson on Frank way and a wedding for his dtr.  Very excited about these events.  Loves classic rock-led Frank [name] and Frank [name].  Retired from [name]    Pertinent Vitals/ Pain       Pain  Assessment Pain Assessment: No/denies pain  Home Living                                          Prior Functioning/Environment              Frequency  Min 1X/week        Progress Toward Goals  OT Goals(current goals can now be found in Frank care plan section)  Progress towards OT goals: Progressing toward goals     Plan      Co-evaluation                 AM-PAC OT 6 Clicks Daily Activity     Outcome Measure   Help from another person eating meals?: A Lot Help from another person taking care of personal grooming?: A Lot Help from another person toileting, which includes using toliet, bedpan, or urinal?: Total Help from another person bathing (including washing, rinsing, drying)?: A Lot Help from another person to put on and taking off regular upper body clothing?: A Lot Help from another person to put on and taking off regular lower body clothing?: A Lot 6 Click Score: 11    End of Session    OT Visit Diagnosis: Unsteadiness on feet (R26.81);Other abnormalities of gait and mobility (R26.89);Muscle weakness (generalized) (M62.81);Pain   Activity Tolerance Patient tolerated treatment well   Patient Left in bed;with call bell/phone within reach   Nurse Communication Other (comment) (rn states ok to work with pt., updated on session details)        Time: 9185-9166 OT Time Calculation (min): 19 min  Charges: OT General Charges $OT Visit: 1 Visit OT Treatments $Therapeutic Activity: 8-22 mins  Frank [name], COTA/L Acute Rehabilitation [phone]   Frank [name]  09/14/2023, 8:50 AM

## 2023-09-14 NOTE — Progress Notes (Signed)
 Patients spouse inquired whether the patient could be evaluated by psych during hospital admission for intermittent, explosive agitation and depression.  Patient had been calm this morning with staff but suddenly began cursing and yelling after his bed became unplugged when moving it closer to the window for him.  Patient had 2, witnessed episodes of tearing up when discussing his L arm with his friend this morning and after his episode of agitation with staff.  MD notified of spousal request.

## 2023-09-14 NOTE — Assessment & Plan Note (Signed)
 History of Pathological fracture and multiple ORIF left Humerus 01/2023

## 2023-09-14 NOTE — Progress Notes (Signed)
 Patient c/o lightheadedness while sitting.  Transferred back to bed using maximove and +2 assist.  Tolerated well.

## 2023-09-14 NOTE — Progress Notes (Signed)
 Patient moved from bed to chair using maximove and +2 assist.  Tolerated well.  Patient most comfortable when sitting upright versus reclined.  Patient states appreciation for staff assisting him with sitting. Chair alarm on.

## 2023-09-14 NOTE — Progress Notes (Signed)
 Progress Note   Patient: Frank [name]. FMW:[id number] DOB: 11/09/1965 DOA: 07/03/2023     73 DOS: the patient was seen and examined on 09/14/2023 at 9:50AM      Brief hospital course: 58 y.o. M with MM, pathological L humerus fracture, AF not on [facility] admitted for sepsis, dCHF, CAD, encephalopathy and renal failure.        Assessment and Plan: Septic shock Febrile neutropenia Admitted to OSH with concerns for pneumonia. Antibiotics continued on transfer here, but fevers persisted.  Subsequently developed severe neutropenia during hospital week 2-3, broadened to vancomycin  and meropenem .      ID were consulted.  Cultures notable for Aspergillus tracheal aspirate and Staph epidermidis bactermia and tracheal aspirate.  Ultimately fevers subsided after about 6 weeks.   See below re: aspergillus treatment.   Acute renal failure CKD IIIa Developed oliguric renal failure here, was on CRRT transitioned to iHD.    Ultimately, renal function started to recover.  Now off dialysis and Cr stable.     Multiple myeloma Admitted and BMB confirmed MM.  Started on Velcade  and Cytoxan  by Oncology, transitioned to Kyprolis  then Sarclisa .   - Sarclisa  per Oncology    Aspergillosis and pneumonia - Continue Voriconazole  until 8/5   Diabetes   Glucose controlled - Continue glargine - Continue SS corrections   Paroxysmal atrial fibrillation Rate controlled - Hold anticoagulation given thigh hematoma   OSA - CPAP at night  Bipolar disorder - Continue Paxil    Severe Malnutrition Class 1 obesity - Continue Prosource and Nepro   History of Pathological fracture and multiple ORIF left Humerus 01/2023 Discussed with orthopedics today.  Options would involve either a substantial reconstruction with antibiotic cement, prolonged recovery, or above elbow amputation.  This should be deferred to the outpatient setting - Follow-up with 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 8.4 x 10.4 x 22.2 cm subcutaneous hematoma within the right gluteal region - continue supportive care.  Hemoglobin stable   Severe acute metabolic encephalopathy with hypoactive delirium Encephalopathy resolved  Uremia Hyponatremia Hypokalemia       Subjective: No new complaints.  No fever, no respiratory symptoms.        Physical Exam: BP 117/75   Pulse 94   Temp 99.3 F (37.4 C) (Axillary)   Resp 18   Ht 6' 0.01 (1.829 m)   Wt 102 kg   SpO2 95%   BMI 30.49 kg/m   Deconditioned, malnourished adult male, lying in bed, sleeping, Rouses, answers questions appropriately RRR, no murmurs, no pitting in the extremities Respiratory rate normal, lungs diminished, no rales or wheezes Abdomen soft, no tenderness to palpation       Data Reviewed: Sodium stable at 130, BUN to creatiine ratio elevated, Cr normal CBC shows stable anemia, WBC and platelets normal   Family Communication:     Disposition: Status is: Inpatient The patient was admitted for sepsis and renal failure.  These have now resolved.  He is  unfortunately too weak to sit up and so is not appropriate for outpatient chemotherapy.  He will have to either get stronger enough (in the hospital) to sit up for outpatient chemo, complete chemo, or pause chemo long enough to rehab to be able to sit up.              Author: Lonni SHAUNNA Dalton,  MD 09/14/2023 12:33 PM  For on call review [email or web address].

## 2023-09-15 ENCOUNTER — Inpatient Hospital Stay (HOSPITAL_COMMUNITY)

## 2023-09-15 DIAGNOSIS — C9000 Multiple myeloma not having achieved remission: Secondary | ICD-10-CM | POA: Diagnosis not present

## 2023-09-15 LAB — CBC WITH DIFFERENTIAL/PLATELET
Abs Immature Granulocytes: 0.09 10*3/uL — ABNORMAL HIGH (ref 0.00–0.07)
Basophils Absolute: 0 10*3/uL (ref 0.0–0.1)
Basophils Relative: 0 %
Eosinophils Absolute: 0 10*3/uL (ref 0.0–0.5)
Eosinophils Relative: 0 %
HCT: 27.6 % — ABNORMAL LOW (ref 39.0–52.0)
Hemoglobin: 8.9 g/dL — ABNORMAL LOW (ref 13.0–17.0)
Immature Granulocytes: 2 %
Lymphocytes Relative: 13 %
Lymphs Abs: 0.7 10*3/uL (ref 0.7–4.0)
MCH: 35.6 pg — ABNORMAL HIGH (ref 26.0–34.0)
MCHC: 32.2 g/dL (ref 30.0–36.0)
MCV: 110.4 fL — ABNORMAL HIGH (ref 80.0–100.0)
Monocytes Absolute: 0.6 10*3/uL (ref 0.1–1.0)
Monocytes Relative: 10 %
Neutro Abs: 4.1 10*3/uL (ref 1.7–7.7)
Neutrophils Relative %: 75 %
Platelets: 211 10*3/uL (ref 150–400)
RBC: 2.5 MIL/uL — ABNORMAL LOW (ref 4.22–5.81)
RDW: 24.6 % — ABNORMAL HIGH (ref 11.5–15.5)
WBC: 5.5 10*3/uL (ref 4.0–10.5)
nRBC: 0 % (ref 0.0–0.2)

## 2023-09-15 LAB — RENAL FUNCTION PANEL
Albumin: 1.9 g/dL — ABNORMAL LOW (ref 3.5–5.0)
Anion gap: 8 (ref 5–15)
BUN: 61 mg/dL — ABNORMAL HIGH (ref 6–20)
CO2: 25 mmol/L (ref 22–32)
Calcium: 8.2 mg/dL — ABNORMAL LOW (ref 8.9–10.3)
Chloride: 96 mmol/L — ABNORMAL LOW (ref 98–111)
Creatinine, Ser: 1.06 mg/dL (ref 0.61–1.24)
GFR, Estimated: >60 mL/min (ref >60)
Glucose, Bld: 123 mg/dL — ABNORMAL HIGH (ref 70–99)
Phosphorus: 3.7 mg/dL (ref 2.5–4.6)
Potassium: 4.8 mmol/L (ref 3.5–5.1)
Sodium: 129 mmol/L — ABNORMAL LOW (ref 135–145)

## 2023-09-15 LAB — GLUCOSE, CAPILLARY
Glucose-Capillary: 100 mg/dL — ABNORMAL HIGH (ref 70–99)
Glucose-Capillary: 135 mg/dL — ABNORMAL HIGH (ref 70–99)
Glucose-Capillary: 131 mg/dL — ABNORMAL HIGH (ref 70–99)
Glucose-Capillary: 171 mg/dL — ABNORMAL HIGH (ref 70–99)

## 2023-09-15 LAB — PROTEIN ELECTROPHORESIS, SERUM
A/G Ratio: 0.9 (ref 0.7–1.7)
Albumin ELP: 2.4 g/dL — ABNORMAL LOW (ref 2.9–4.4)
Alpha-1-Globulin: 0.2 g/dL (ref 0.0–0.4)
Alpha-2-Globulin: 0.6 g/dL (ref 0.4–1.0)
Beta Globulin: 0.7 g/dL (ref 0.7–1.3)
Gamma Globulin: 1.1 g/dL (ref 0.4–1.8)
Globulin, Total: 2.6 g/dL (ref 2.2–3.9)
M-Spike, %: 0.8 g/dL — ABNORMAL HIGH
Total Protein ELP: 5 g/dL — ABNORMAL LOW (ref 6.0–8.5)

## 2023-09-15 LAB — KAPPA/LAMBDA LIGHT CHAINS
Kappa free light chain: 23.4 mg/L — ABNORMAL HIGH (ref 3.3–19.4)
Kappa, lambda light chain ratio: 1.38 (ref 0.26–1.65)
Lambda free light chains: 16.9 mg/L (ref 5.7–26.3)

## 2023-09-15 MED ORDER — SODIUM CHLORIDE 0.9 % IV SOLN: INTRAVENOUS | Status: AC

## 2023-09-15 NOTE — Progress Notes (Signed)
 In my opinion, I think Mr. Harrel is making some nice progress.  He still is on a pured diet.  Hopefully, he will be able to have regular food.  He has basically no use of his left arm.  This really needs to be addressed.  I do not know if he has a frozen shoulder.  I am sure that he probably will need to have radiotherapy to that arm because of the pathologic fracture that was there.  He is urinating.  He is not having diarrhea.  He is having better pain control.  I think that the air mattress is helping him.  He has not had any dialysis.  He is quite anemic.  His labs a show sodium 129.  Potassium 4.8.  BUN 61 creatinine 1.06.  Calcium  8.2 with an albumin  of 1.9.  His white cell count is 5.5.  Hemoglobin 8.9.  Platelet count 211,000.  I still not sure what the discharge plans are for him.  He, I think he had this treatment now should be off treatment for a couple of weeks.  He got Sarclisa  this past Friday.  Again, he has responded very nicely to treatment.  Now that he is making urine, we can do a 24-hour urine on him.  We can see how that looks compared to what he had 2 months ago.  Again it was nice to talk with him.  I really hope that the left arm could somehow be worked with so that he will have more function.  Again I would think that he is going to need radiotherapy for that arm at some point.  All of his vital signs are stable.  Temperature 98.3.  Pulse 85.  Blood pressure 127/72.  His lungs sound clear bilaterally.  Cardiac exam regular rate and rhythm.  He has no murmurs.  Abdomen is soft.  Bowel sounds are present.  He has no fluid wave.  There is no palpable liver or spleen tip.  Extremities shows no clubbing, cyanosis or edema.  He really cannot move the left arm.  Neurological exam shows no focal deficits.  Again, I will move his next treatment back a week.  I think it will be nice to give him a break.   Jeralyn Crease, MD  Acts 2:26

## 2023-09-15 NOTE — Progress Notes (Signed)
 Calorie Count Note  48 hour calorie count ordered.  Diet: Dysphagia 1 Supplements: None  Estimated Nutritional Needs:  Kcal:  2400-2700 kcal/d Protein:  130-150g/d Fluid:  1L+UOP  Day 1 Results Lunch: 383 calories, 23 gm protein Dinner: 355 calories, 19 gm protein  Breakfast: no meal ticket Supplements: none  Total intake: 738 kcal (31% of minimum estimated needs)  42 protein (32% of minimum estimated needs)  Day 2 Results Lunch: 191 calories, 2 gm protein  Dinner: 510 calories, 23 gm protein Breakfast: no meal ticket Supplements: none   Total intake: 701 kcal (29% of minimum estimated needs)  25 protein (19% of minimum estimated needs)  Nutrition Diagnosis: Severe Malnutrition related to acute illness (prolonged illness, interuptions in feeding) as evidenced by moderate fat depletion, moderate muscle depletion, percent weight loss (9.6% x 1 month).   Goal: Patient will meet greater than or equal to 90% of their needs   Intervention:  Continue current diet as ordered Encourage PO intake Continue bolus tube feeding via PEG: 6 cartons of Nepro 1.8 per day (1.5 cartons x 4 feeds per day) Will ensure there is a 2 hour break for voriconazole  administration BID (0900-1100, 2100-2300) Prosource TF20 60mL 1x/d Flush with 30mL before and after each blus feed (60mL 4x/d) This will provide 2600 kcal, 134g of protein, and of free water  ( TF+flush) Continue Juven 1 packet BID via tube, each packet provides 95 calories, 2.5 grams of protein (collagen) + micronutrients to support wound healing MVI with minerals daily Magic cup TID with meals, each supplement provides 290 kcal and 9 grams of protein     Nestora Glatter RD, LDN Clinical Dietitian

## 2023-09-15 NOTE — Plan of Care (Signed)
  Problem: Education: Goal: Knowledge of General Education information will improve Description: Including pain rating scale, medication(s)/side effects and non-pharmacologic comfort measures Outcome: Progressing   Problem: Health Behavior/Discharge Planning: Goal: Ability to manage health-related needs will improve Outcome: Progressing   Problem: Clinical Measurements: Goal: Ability to maintain clinical measurements within normal limits will improve Outcome: Progressing Goal: Will remain free from infection Outcome: Progressing Goal: Diagnostic test results will improve Outcome: Progressing Goal: Respiratory complications will improve Outcome: Progressing Goal: Cardiovascular complication will be avoided Outcome: Progressing   Problem: Activity: Goal: Risk for activity intolerance will decrease Outcome: Progressing   Problem: Nutrition: Goal: Adequate nutrition will be maintained Outcome: Progressing   Problem: Coping: Goal: Level of anxiety will decrease Outcome: Progressing   Problem: Elimination: Goal: Will not experience complications related to bowel motility Outcome: Progressing Goal: Will not experience complications related to urinary retention Outcome: Progressing   Problem: Pain Managment: Goal: General experience of comfort will improve and/or be controlled Outcome: Progressing   Problem: Safety: Goal: Ability to remain free from injury will improve Outcome: Progressing   Problem: Skin Integrity: Goal: Risk for impaired skin integrity will decrease Outcome: Progressing   Problem: Education: Goal: Ability to describe self-care measures that may prevent or decrease complications (Diabetes Survival Skills Education) will improve Outcome: Progressing Goal: Individualized Educational Video(s) Outcome: Progressing   Problem: Coping: Goal: Ability to adjust to condition or change in health will improve Outcome: Progressing   Problem: Fluid  Volume: Goal: Ability to maintain a balanced intake and output will improve Outcome: Progressing   Problem: Health Behavior/Discharge Planning: Goal: Ability to identify and utilize available resources and services will improve Outcome: Progressing Goal: Ability to manage health-related needs will improve Outcome: Progressing   Problem: Metabolic: Goal: Ability to maintain appropriate glucose levels will improve Outcome: Progressing   Problem: Nutritional: Goal: Maintenance of adequate nutrition will improve Outcome: Progressing Goal: Progress toward achieving an optimal weight will improve Outcome: Progressing   Problem: Skin Integrity: Goal: Risk for impaired skin integrity will decrease Outcome: Progressing   Problem: Tissue Perfusion: Goal: Adequacy of tissue perfusion will improve Outcome: Progressing   Problem: Education: Goal: Knowledge about tracheostomy care/management will improve Outcome: Progressing   Problem: Activity: Goal: Ability to tolerate increased activity will improve Outcome: Progressing   Problem: Health Behavior/Discharge Planning: Goal: Ability to manage tracheostomy will improve Outcome: Progressing   Problem: Respiratory: Goal: Patent airway maintenance will improve Outcome: Progressing   Problem: Role Relationship: Goal: Ability to communicate will improve Outcome: Progressing   Problem: Education: Goal: Knowledge of disease and its progression will improve Outcome: Progressing Goal: Individualized Educational Video(s) Outcome: Progressing   Problem: Fluid Volume: Goal: Compliance with measures to maintain balanced fluid volume will improve Outcome: Progressing   Problem: Health Behavior/Discharge Planning: Goal: Ability to manage health-related needs will improve Outcome: Progressing   Problem: Nutritional: Goal: Ability to make healthy dietary choices will improve Outcome: Progressing

## 2023-09-15 NOTE — Progress Notes (Signed)
  Progress Note   Patient: Frank [name]. FMW:[id number] DOB: Jul 13, 1965 DOA: 07/03/2023     74 DOS: the patient was seen and examined on 09/15/2023 at 9:05AM      Brief hospital course: 58 y.o. M with MM, pathological L humerus fracture, AF not on [facility] admitted for sepsis, anemia, encephalopathy and renal failure.         Assessment and Plan: Septic shock Febrile neutropenia     Acute renal failure CKD IIIa   Severe Malnutrition Class 1 obesity Calorie count 6/28 to 6.30 showed he is still eating <50% of food.  Not safe for stopping tube feeds. - Continue Prosource and Nepro     Multiple myeloma Next chemo is due in 3-4 weeks - Follow up with Oncology    Aspergillosis and pneumonia - Continue Voriconazole  until 8/5   Diabetes   Glucose controlled - Continue glargine - Continue SS corrections   Paroxysmal atrial fibrillation Rate controlled - Hold anticoagulation given thigh hematoma   OSA - CPAP at night  Bipolar disorder - Continue Paxil       History of Pathological fracture and multiple ORIF left Humerus 01/2023 This has been discussed with orthopedics.  This should be deferred to the outpatient setting when he has had more time to rehabilitate, likely amputation - Outpatient follow-up with [name]  Normoycytic anemia due to malignancy Stable, no bleeding  Hypovolemic hyponatremia Due to dehydration - Repeat IV fluids   Coronary artery disease without angina Chronic diastolic congestive heart failure Hypertension BP controlled - Hold spironolactone , lisinopril , HCTZ, Lasix , aspirin , verapamil , hydralazine   Asthma No active flare - Continue Brovana , Yupelri , Singulair      Resolved or stable issues: Acute Hypoxic Respiratory Failure Resolved   Right Thigh Hematoma  - per CT on 08/01/23: 12.1 x 30.2 x 7.6 cm subcutaneous hematoma within the anterior right thigh. 8.4 x 10.4 x 22.2 cm subcutaneous hematoma within the right gluteal  region - continue supportive care.  Hemoglobin stable   Severe acute metabolic encephalopathy with hypoactive delirium Encephalopathy resolved  Uremia Hyponatremia Hypokalemia       Subjective: No new complaints.  No fever, no respiratory symptoms.        Physical Exam: BP 130/89 (BP Location: Right Arm)   Pulse 100   Temp 98.9 F (37.2 C) (Oral)   Resp 20   Ht 6' 0.01 (1.829 m)   Wt 103 kg   SpO2 97%   BMI 30.79 kg/m   Deconditioned, malnourished adult male, lying in bed, sleeping, Rouses, answers questions appropriately RRR, no murmurs, no pitting in the extremities Respiratory rate normal, lungs diminished, no rales or wheezes Abdomen soft, no tenderness to palpation       Data Reviewed: BMP and CBC stable   Family Communication: Wife by phone    Disposition: Status is: Inpatient The patient was admitted for sepsis and renal failure.  These have now resolved.  He is unfortunately too weak to sit up and so is not appropriate for outpatient chemotherapy.  He will have to either get stronger enough (in the hospital) to sit up for outpatient chemo, complete chemo, or pause chemo long enough to rehab to be able to sit up.                 Author: Lonni SHAUNNA Dalton, MD 09/15/2023 5:03 PM  For on call review [email or web address].

## 2023-09-16 DIAGNOSIS — A419 Sepsis, unspecified organism: Secondary | ICD-10-CM | POA: Diagnosis not present

## 2023-09-16 DIAGNOSIS — R6521 Severe sepsis with septic shock: Secondary | ICD-10-CM | POA: Diagnosis not present

## 2023-09-16 LAB — CBC WITH DIFFERENTIAL/PLATELET
Abs Immature Granulocytes: 0.05 10*3/uL (ref 0.00–0.07)
Basophils Absolute: 0 10*3/uL (ref 0.0–0.1)
Basophils Relative: 0 %
Eosinophils Absolute: 0 10*3/uL (ref 0.0–0.5)
Eosinophils Relative: 0 %
HCT: 26.5 % — ABNORMAL LOW (ref 39.0–52.0)
Hemoglobin: 8.8 g/dL — ABNORMAL LOW (ref 13.0–17.0)
Immature Granulocytes: 1 %
Lymphocytes Relative: 14 %
Lymphs Abs: 0.8 10*3/uL (ref 0.7–4.0)
MCH: 36.2 pg — ABNORMAL HIGH (ref 26.0–34.0)
MCHC: 33.2 g/dL (ref 30.0–36.0)
MCV: 109.1 fL — ABNORMAL HIGH (ref 80.0–100.0)
Monocytes Absolute: 0.6 10*3/uL (ref 0.1–1.0)
Monocytes Relative: 11 %
Neutro Abs: 4.1 10*3/uL (ref 1.7–7.7)
Neutrophils Relative %: 74 %
Platelets: 219 10*3/uL (ref 150–400)
RBC: 2.43 MIL/uL — ABNORMAL LOW (ref 4.22–5.81)
RDW: 24 % — ABNORMAL HIGH (ref 11.5–15.5)
WBC: 5.6 10*3/uL (ref 4.0–10.5)
nRBC: 0 % (ref 0.0–0.2)

## 2023-09-16 LAB — RENAL FUNCTION PANEL
Albumin: 2 g/dL — ABNORMAL LOW (ref 3.5–5.0)
Anion gap: 9 (ref 5–15)
BUN: 60 mg/dL — ABNORMAL HIGH (ref 6–20)
CO2: 24 mmol/L (ref 22–32)
Calcium: 8.6 mg/dL — ABNORMAL LOW (ref 8.9–10.3)
Chloride: 99 mmol/L (ref 98–111)
Creatinine, Ser: 0.99 mg/dL (ref 0.61–1.24)
GFR, Estimated: >60 mL/min (ref >60)
Glucose, Bld: 116 mg/dL — ABNORMAL HIGH (ref 70–99)
Phosphorus: 4.1 mg/dL (ref 2.5–4.6)
Potassium: 4.5 mmol/L (ref 3.5–5.1)
Sodium: 132 mmol/L — ABNORMAL LOW (ref 135–145)

## 2023-09-16 LAB — GLUCOSE, CAPILLARY
Glucose-Capillary: 124 mg/dL — ABNORMAL HIGH (ref 70–99)
Glucose-Capillary: 130 mg/dL — ABNORMAL HIGH (ref 70–99)
Glucose-Capillary: 108 mg/dL — ABNORMAL HIGH (ref 70–99)
Glucose-Capillary: 134 mg/dL — ABNORMAL HIGH (ref 70–99)

## 2023-09-16 MED ORDER — PROSOURCE TF20 ENFIT COMPATIBL EN LIQD
60.0000 mL | Freq: Two times a day (BID) | ENTERAL | Status: DC
  Administered 2023-09-16 – 2023-09-30 (×26): 60 mL
  Filled 2023-09-16 (×29): qty 60

## 2023-09-16 MED ORDER — DAKINS (1/4 STRENGTH) 0.125 % EX SOLN
Freq: Every day | CUTANEOUS | Status: DC
  Filled 2023-09-16: qty 473

## 2023-09-16 MED ORDER — NEPRO/CARBSTEADY PO LIQD
237.0000 mL | Freq: Three times a day (TID) | ORAL | Status: DC
  Administered 2023-09-16 – 2023-09-23 (×27): 237 mL
  Filled 2023-09-16: qty 237

## 2023-09-16 NOTE — Consult Note (Signed)
 WOC Nurse wound follow up Wound type: Unstageable sacrum  R Buttocks stage 3 L buttocks stage 2 Measurement: Unstageable sacrum 4 cm x 3 cm  R Buttocks stage 3: 3 cm x 2 cm  L buttocks stage 2: 0.5 cm x 0.5 cm  Wound bed: Unstageable sacrum:  100% covered in yellow slough L Buttocks stage 3: pink moist with scattered adipose tissue and granulation tissue R buttocks stage 2: pink moist Drainage (amount, consistency, odor)  Unstageable sacrum: purulent  R Buttocks stage 3:  serosanguineous  L buttocks stage 2: serosanguineous Periwound: intact Dressing procedure/placement/frequency: Sacrum: Cleanse wound with NS allow to air dry, apply Dakins soaked gauze to wound bed, cover with abd pad and tape, change daily and PRN soiling.  R buttocks/ L buttocks: cleanse wound with Vashe Soila # [phone]) allow to air dry and cover with foam dressing, lift dressing daily to view wound and cleanse and change foam dressing Q 3 days and PRN soiling.  Will add PT for wound care starting this week.   WOC team will continue to follow patient weekly, please re-consult if new needs arise.  Thank you,  Doyal Polite, RN, MSN, [facility] WOC Team

## 2023-09-16 NOTE — Progress Notes (Signed)
 Occupational Therapy Treatment Patient Details Name: Frank [name]. MRN: [id number] DOB: 10/01/1965 Today's Date: 09/16/2023   History of present illness 58 y.o. male transferred from [facility] 07/03/23 to [facility] for management of newly diagnosed plasma cell myeloma with weakness and AMS. Pt also with AKI and metabolic encephalopathy. 5/10 transfer to [facility] for iHD. 4/22 Intubated and chemo initiated. 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 5/6 IVIG started. 5/8-6/11 trach. 5/12 iHD initiated. 5/29 PEG placed. 6/5 chemo initiated. PMHx:Lt THA, carpal tunnel syndrome, Lt TKA, Lt humerus fx, PAF, HTN, HLD, bipolar disorder, obesity, T2DM   OT comments  Pt agreeable to work with therapy but adamantly refusing to wear L UE splint during session despite education. Pt agreeable to use of gait belt for support and protection of L UE during mobility. Pt required max A +2 for bed mobility and was able to tolerate sitting EOB for grooming/hygiene/UB ADL task. Attempted STS from elevated EOB, x 2 but pt unable to clear bottom with max A +2 due to B LE weakness. Pt declined adamantly declined transfer to chair. despite education and encouragement and stated I can't do it. Pt returned to supine total A +2 and repositioned appropriately total A to HOB. OT will continue to follow acutely to maximize level of function and safety Noted most recent imaging/note L humerus non-union, splinted. no elbow ROM, but shoulder ROM ok per secure chat with Dr. Jonel 7/1      If plan is discharge home, recommend the following:  Two people to help with walking and/or transfers;Assistance with cooking/housework;Assistance with feeding;Direct supervision/assist for medications management;Direct supervision/assist for financial management;Assist for transportation;Help with stairs or ramp for entrance;A lot of help with bathing/dressing/bathroom   Equipment Recommendations  Other (comment) (defer)    Recommendations for Other Services       Precautions / Restrictions Precautions Recall of Precautions/Restrictions: Impaired Precaution/Restrictions Comments: episode of syncope, sacral wound, chemo. Lt humerus non-union, splinted. no elbow ROM, but shoulder ROM ok per secure chat with Dr. Jonel 7/1 Restrictions Weight Bearing Restrictions Per Provider Order: Yes LUE Weight Bearing Per Provider Order: Non weight bearing Other Position/Activity Restrictions: Lt humerus non-union, splinted. no elbow ROM, but shoulder ROM ok per secure chat with Dr. Jonel 7/1       Mobility Bed Mobility Overal bed mobility: Needs Assistance Bed Mobility: Supine to Sit, Sit to Supine     Supine to sit: Max assist, +2 for physical assistance, +2 for safety/equipment Sit to supine: Total assist, +2 for physical assistance, +2 for safety/equipment   General bed mobility comments: MaxAx2 with use of bed pad to shift hips towards EOB and raise trunk. Pt slightly able to assist with bringing legs towards EOB. TotalAx2 required for return to supine    Transfers Overall transfer level: Needs assistance Equipment used: 2 person hand held assist Transfers: Sit to/from Stand Sit to Stand: Total assist, +2 physical assistance          Lateral/Scoot Transfers: Total assist, +2 physical assistance General transfer comment: Attempted STS from elevated EOB, but pt unable to clear bottom with max A +2 due to BLE weakness. Pt declined transfer to chair. Lt humerus non-union, splinted. no elbow ROM, but shoulder ROM ok per secure chat with Dr. Jonel 7/1     Balance Overall balance assessment: Needs assistance Sitting-balance support: Single extremity supported, Feet supported Sitting balance-Leahy Scale: Fair Sitting balance - Comments: sitting EOB with CGA   Standing balance support: Single extremity supported, During functional  activity, Reliant on assistive device for balance Standing balance-Leahy Scale: Zero                              ADL either performed or assessed with clinical judgement   ADL Overall ADL's : Needs assistance/impaired     Grooming: Wash/dry hands;Wash/dry face;Contact guard assist;Sitting   Upper Body Bathing: Moderate assistance;Sitting       Upper Body Dressing : Moderate assistance;Sitting                     General ADL Comments: pt declined sitting up in relciner with use of Hoyer after 2 unsucessful sit- stand trials    Extremity/Trunk Assessment Upper Extremity Assessment Upper Extremity Assessment: Generalized weakness;RUE deficits/detail;LUE deficits/detail RUE Deficits / Details: Generalized weakness, pt using functionally during bed mobility, impaired grasp (loose). propping self with RUE. LUE Deficits / Details: Lt humerus non-union, splinted. no elbow ROM, but shoulder ROM ok per secure chat with Dr. Jonel 7/1            Vision Baseline Vision/History: 1 Wears glasses Ability to See in Adequate Light: 0 Adequate Patient Visual Report: No change from baseline     Perception     Praxis     Communication Communication Communication: Impaired Factors Affecting Communication: Reduced clarity of speech   Cognition Arousal: Alert Behavior During Therapy: [facility] for tasks assessed/performed                                 Following commands: Impaired Following commands impaired: Follows one step commands with increased time      Cueing   Cueing Techniques: Verbal cues  Exercises      Shoulder Instructions       General Comments      Pertinent Vitals/ Pain       Pain Assessment Pain Assessment: Faces Faces Pain Scale: Hurts a little bit Pain Location: back Pain Descriptors / Indicators: Sore, Discomfort, Grimacing Pain Intervention(s): Monitored during session, Repositioned  Home Living                                          Prior Functioning/Environment              Frequency  Min 1X/week         Progress Toward Goals  OT Goals(current goals can now be found in the care plan section)  Progress towards OT goals: Not progressing toward goals - comment;Goals updated  Acute Rehab OT Goals OT Goal Formulation: With patient Time For Goal Achievement: 09/25/23 Potential to Achieve Goals: Fair ADL Goals Pt Will Perform Grooming: with supervision;with set-up;sitting Pt Will Perform Upper Body Dressing: with contact guard assist;with supervision;sitting Pt Will Transfer to Toilet: with max assist;with mod assist;stand pivot transfer;bedside commode Additional ADL Goal #1: Pt will complete bed mobility mod A  to sit EOB in prep for ADLs/selfcare tasks  Plan      Co-evaluation                 AM-PAC OT 6 Clicks Daily Activity     Outcome Measure   Help from another person eating meals?: A Little Help from another person taking care of personal grooming?: A Little Help from another person toileting, which  includes using toliet, bedpan, or urinal?: Total Help from another person bathing (including washing, rinsing, drying)?: A Lot Help from another person to put on and taking off regular upper body clothing?: A Lot Help from another person to put on and taking off regular lower body clothing?: Total 6 Click Score: 12    End of Session Equipment Utilized During Treatment: Gait belt  OT Visit Diagnosis: Unsteadiness on feet (R26.81);Other abnormalities of gait and mobility (R26.89);Muscle weakness (generalized) (M62.81);Pain Pain - part of body:  (back)   Activity Tolerance Patient limited by fatigue;Patient limited by pain   Patient Left in bed;with call bell/phone within reach   Nurse Communication Mobility status        Time: 8887-8850 OT Time Calculation (min): 37 min  Charges: OT General Charges $OT Visit: 1 Visit OT Treatments $Self Care/Home Management : 8-22 mins    Jacques Karna Loose 09/16/2023, 2:19 PM

## 2023-09-16 NOTE — Progress Notes (Signed)
 Nutrition Follow-up  DOCUMENTATION CODES:  Severe malnutrition in context of acute illness/injury  INTERVENTION:  Continue current diet as ordered Encourage PO intake Continue bolus tube feeding via PEG: 4 cartons of Nepro per day (1 cartons x 4 feeds) Will ensure there is a 2 hour break for voriconazole  administration BID (0900-1100, 2100-2300) Prosource TF20 60mL 2x/d Flush with 30mL before and after each bolus feed (60mL 4x/d) This will provide 1860 kcal, 116g of protein, and 688 mL of free water  ( TF+flush) Continue Juven 1 packet BID via tube, each packet provides 95 calories, 2.5 grams of protein (collagen) + micronutrients to support wound healing MVI with minerals daily   NUTRITION DIAGNOSIS:  Severe Malnutrition related to acute illness (prolonged illness, interuptions in feeding) as evidenced by moderate fat depletion, moderate muscle depletion, percent weight loss (9.6% x 1 month). - remains applicable  GOAL:  Patient will meet greater than or equal to 90% of their needs - met with TF  MONITOR:  TF tolerance, I & O's, Labs, Weight trends  REASON FOR ASSESSMENT:  Consult Assessment of nutrition requirement/status  ASSESSMENT:  58 y.o. male with PMH of obesity, asthma, OSA on CPAP, GERD, HTN, HLD who presented due to feeling weak with AMS for a few days. Admitted with concern for sepsis, community-acquired pneumonia, encephalopathy as well as concern for multiple myeloma.  4/17 Admit to [facility] 4/21 SLP eval - NPO; IR attempted Cortrak but unable to be placed; CCM MD successfully placed NGT 4/22 Concern for aspiration overnight; Intubated 4/23 - CRRT 5/8 - tracheostomy 5/10 - CRRT discontinued 5/11 - transferred to [facility] 5/12 - iHD initiated  5/29 - PEG placed 5/31 - chemotherapy changed to Sarclisa /Kyprolis /Cytoxan  6/9 - MBS, NPO recommended  6/11 - trach removed 6/13 - BSE, NPO, tunneled HD catheter removed for line holiday 6/16 - Tunneled HD  replaced,  6/18 - FEES, thin liquids when supervised 6/19, 6/21 - BSE, clear liquids 6/23 - MBS, clear liquids, pt not able to full participate in study due to pain; no HD since 6/16, line pulled pt showing signs of renal recovery 6/24 - FEES, thin liquids and puree ok 6/27 - BSE - DYS 1, thins  Pt resting in bed at the time of assessment. Awake and alert and more conversant than at previous assessments. States he is ok today, but has a headache. Discussed his intake and observed breakfast tray. On kcal count, pt consistently met ~700 kcal a day and this AM ate ~500kcal and 20g of protein on his breakfast tray. Do feel comfortable decreasing his TF regimen slightly as pt has been consistent in his intake. TF will still make up the bulk of his nutritional intake but pt is meeting about 25% of his needs orally.   Discussed with RN and MD. Pt also agreeable with plan to adjust feeds and continue to try and eat at his meals.  Admit weight: 110.6 kg (4/17) Current weight: 96 kg (6/24) - weight was taken after bed scale was zeroed, accurate   Intake/Output Summary (Last 24 hours) at 09/16/2023 1502 Last data filed at 09/16/2023 1242 Gross per 24 hour  Intake 1465 ml  Output 3550 ml  Net -2085 ml  Net IO Since Admission: -61,217.94 mL [09/16/23 1502]  Drains/Lines: PEG 20 Fr. LUQ UOP 3,375mL x 24 hours  Nutrition Related Medications:  Scheduled Meds:  NEPRO CARB STEADY  355 mL Per Tube TID PC & HS   PROSource TF20  60 mL Per Tube Daily  insulin  aspart  0-15 Units Subcutaneous TID AC & HS   insulin  aspart  2 Units Subcutaneous TID AC & HS   insulin  glargine-yfgn  15 Units Subcutaneous Q24H   multivitamin with minerals  1 tablet Per Tube Daily   JUVEN  1 packet Oral BID BM   polyethylene glycol  17 g Oral BID   senna  1 tablet Per Tube Daily   PRN Meds: ondansetron    Labs Reviewed: Sodium 132 BUN 60 CBG ranges from 100-171 mg/dL over the last 24 hours HgbA1c 6.0%  Micronutrient  Labs: CRP 1.5  Vitamin A  42.5 (WNL) Zinc  73 (WNL) Folate 17.8 (WNL)  Diet Order:   Diet Order             DIET - DYS 1 Room service appropriate? No; Fluid consistency: Thin; Fluid restriction: 1200 mL Fluid  Diet effective now                  EDUCATION NEEDS:  Not appropriate for education at this time  Skin: Skin Integrity Issues Per WOC note 6/23 Unstageable  - sacrum 5 cm x 4 cm  Stage 3:  - R Buttocks 3 cm x 2cm  Stage 2: - L buttocks 1 cm x 1 cm   Last BM:  7/1 - type 6  Height:  Ht Readings from Last 1 Encounters:  07/22/23 6' 0.01 (1.829 m)   Weight:  Wt Readings from Last 1 Encounters:  09/15/23 103 kg   Ideal Body Weight:  80.9 kg  BMI:  Body mass index is 30.79 kg/m.  Estimated Nutritional Needs:  Kcal:  2400-2700 kcal/d Protein:  130-150g/d Fluid:  1L+UOP   Frank [name], RD, LDN Registered Dietitian II Please reach out via secure chat

## 2023-09-16 NOTE — Progress Notes (Addendum)
 This chaplain responded to consult for major life transition. The Pt. is participating in patient care at the time of the visit. The chaplain will attempt a revisit.  **1312 Pt. is sleeping at the of time of the visit. The chaplain received an update from the Pt. RN-Tiondra. The chaplain will attempt a revisit.  **1523 Pt. is sleeping at time of F/U. The chaplain will attempt a revisit on Wednesday.  Chaplain Leeroy Hummer [phone]

## 2023-09-16 NOTE — Progress Notes (Signed)
 Progress Note   Patient: Frank [name]. FMW:[id number] DOB: 1965/08/17 DOA: 07/03/2023     75 DOS: the patient was seen and examined on 09/16/2023 at 9:14AM      Brief hospital course: 58 y.o. M with MM, pathological L humerus fracture, AF not on [facility] admitted for sepsis, anemia, encephalopathy and renal failure.     Initially presented to [facility] with 1 week generalized weakness, confusion, decreased appetite.  Found there to have sepsis, anemia and transferred here for further work up. Here developed renal failure and septic shock in setting of febrile neutropenia and ultimately found to have multiple myeloma.    4/17: Admitted to medical floor with oncology consultation to treat multiple myeloma 4/19: Worsening respiratory status/encephalopathy, transferred to ICU -> intubated  4/20: A-fib with RVR. Started heparin . Developed groin and biopsy site hematoma requiring multiple transfusions 4/22: Oncology started Velcade  and Cytoxan   5/2: LP performed  5/8: Trach placed. Some post-op bleeding improved with thrombi-pad 5/10: Voriconazole  added back based on CT Chest findings 5/13: Able to follow some commands. 5/23: Transferred OOU 5/29: PEG placed 6/5: Last dose Kyprolis /Cytoxan , first dose Sarclisa  6/13: TDC removed for line holiday 6/16: New HD catheter placed 6/23: No HD for 1 week, removed HD catheter  6/28: Finished first cycle of chemo, next cycle to start in 2 weeks       Assessment and Plan: Septic shock Febrile neutropenia Admitted to OSH with concerns for pneumonia and concern for MM.  Antibiotics continued on transfer here, but fevers persisted.  Subsequently developed severe neutropenia during hospital week 2-3, broadened to vancomycin  and meropenem .       ID were consulted.  Cultures notable for Aspergillus tracheal aspirate and Staph epidermidis bactermia and tracheal aspirate.  Ultimately fevers subsided after about 6 weeks.   See below re: aspergillus  treatment.       Multiple myeloma Admitted and BMB confirmed MM.  Started on Velcade  and Cytoxan  by Oncology, transitioned to Kyprolis  then Sarclisa .   - Sarclisa  per Oncology - Next dose chemo due ~7/14     Acute renal failure CKD IIIa Developed oliguric renal failure here, was on CRRT transitioned to iHD.     Ultimately, renal function started to recover.  Now off dialysis and Cr stable.      Aspergillosis and pneumonia - Continue Voriconazole  until 8/5    History of Pathological fracture and multiple ORIF left Humerus 01/2023 Discussed with orthopedics 6/26.  Options would involve either a substantial reconstruction with antibiotic cement and prolonged, difficult recovery, or above elbow amputation.  [name] recommends more rehabilitation at this time prior to attempting surgery. - Maintain left arm splint when out of bed - Follow-up with [name] after discharge   Stage III pressure injury right buttock not POA Stage II pressure injury left buttock not POA Unstageable pressure injury sacrum not POA Unstageable left heel not POA - WOC consult - Air loss mattress    Severe Malnutrition Class 1 obesity Underwent calorie count 6/28-6/30, eating only 25-30% of meals.  Tube feed reduced from 8 to 6 cartons but still requiring majority of calories per tube.  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 8.4 x 10.4 x 22.2 cm subcutaneous hematoma within the right gluteal region - continue supportive care.  Hemoglobin stable   Severe acute metabolic encephalopathy with hypoactive delirium At baseline patient has no known cognitive impairment.  Here, he developed severe disorientation and confusion due to sepsis, fever, myeloma.  Encephalopathy mostly resolved but still some subtle cognitive deficits.    Uremia Hyponatremia Hypokalemia              Subjective: Weak, tired.  Left arm hurts.  No change to memory.  No new fever.  No vomiting.     Physical Exam: BP (!) 142/87 (BP Location: Right Arm)   Pulse 90   Temp 99.1 F (37.3 C) (Axillary)   Resp 19   Ht 6' 0.01 (1.829 m)   Wt 103 kg   SpO2 95%   BMI 30.79 kg/m   Deconditioned, malnourished adult male, lying in bed, sleeping, Rouses, answers questions appropriately RRR, no murmurs, no pitting in the extremities Respiratory rate normal, lungs diminished, no rales or wheezes Abdomen soft, no tenderness to palpation  Data Reviewed: BMP shows slightly improved sodium Unchanged elevated BUN to creatinine Cr normal CBC shows stblae anemia   Family Communication:     Disposition: Status is: Inpatient 58 y.o. M with new multiple myeloma admitted for sepsis, febrile neutropenia, renal failure, encephalopathy  Now medically stabilzied and improving on chemotherapy for MM.  However, too debilitated to sit up, and therefore not stable for outpatient chemo. Will need to strengthen inpatient, prior to safe  discharge.        Author: Lonni SHAUNNA Dalton, MD 09/16/2023 4:00 PM  For on call review [email or web address].

## 2023-09-16 NOTE — Progress Notes (Deleted)
 Occupational Therapy Treatment Patient Details Name: Frank [name]. MRN: [id number] DOB: 1966-01-13 Today's Date: 09/16/2023   History of present illness 58 y.o. male transferred from [facility] 07/03/23 to [facility] for management of newly diagnosed plasma cell myeloma with weakness and AMS. Pt also with AKI and metabolic encephalopathy. 5/10 transfer to [facility] for iHD. 4/22 Intubated and chemo initiated. 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 5/6 IVIG started. 5/8-6/11 trach. 5/12 iHD initiated. 5/29 PEG placed. 6/5 chemo initiated. PMHx:Lt THA, carpal tunnel syndrome, Lt TKA, Lt humerus fx, PAF, HTN, HLD, bipolar disorder, obesity, T2DM   OT comments  Pt agreeable to work with therapy but adamantly refusing to wear L UE splint during session despite education. Pt agreeable to use of gait belt for support and protection of L UE during mobility. Pt required max A +2 for bed mobility and was able to tolerate sitting EOB for grooming/hygiene/UB ADL task. Attempted STS from elevated EOB, x 2 but pt unable to clear bottom with max A +2 due to B LE weakness. Pt declined adamantly declined transfer to chair. despite education and encouragement and stated I can't do it. Pt returned to supine total A +2 and repositioned appropriately total A to HOB. OT will continue to follow acutely to maximize level of function and safety Noted most recent imaging/note L humerus non-union, splinted. no elbow ROM, but shoulder ROM ok per secure chat with Dr. Jonel 7/1       If plan is discharge home, recommend the following:  Two people to help with walking and/or transfers;Assistance with cooking/housework;Assistance with feeding;Direct supervision/assist for medications management;Direct supervision/assist for financial management;Assist for transportation;Help with stairs or ramp for entrance;A lot of help with bathing/dressing/bathroom   Equipment Recommendations  Other (comment) (defer)    Recommendations for Other  Services      Precautions / Restrictions Precautions Recall of Precautions/Restrictions: Impaired Precaution/Restrictions Comments: episode of syncope, sacral wound, chemo. Lt humerus non-union, splinted. no elbow ROM, but shoulder ROM ok per secure chat with Dr. Jonel 7/1 Restrictions Weight Bearing Restrictions Per Provider Order: Yes LUE Weight Bearing Per Provider Order: Non weight bearing Other Position/Activity Restrictions: Lt humerus non-union, splinted. no elbow ROM, but shoulder ROM ok per secure chat with Dr. Jonel 7/1       Mobility Bed Mobility Overal bed mobility: Needs Assistance Bed Mobility: Supine to Sit, Sit to Supine     Supine to sit: Max assist, +2 for physical assistance, +2 for safety/equipment Sit to supine: Total assist, +2 for physical assistance, +2 for safety/equipment   General bed mobility comments: MaxAx2 with use of bed pad to shift hips towards EOB and raise trunk. Pt slightly able to assist with bringing legs towards EOB. TotalAx2 required for return to supine    Transfers Overall transfer level: Needs assistance Equipment used: 2 person hand held assist Transfers: Sit to/from Stand Sit to Stand: Total assist, +2 physical assistance          Lateral/Scoot Transfers: Total assist, +2 physical assistance General transfer comment: Attempted STS from elevated EOB, but pt unable to clear bottom with max A +2 due to BLE weakness. Pt declined transfer to chair. Lt humerus non-union, splinted. no elbow ROM, but shoulder ROM ok per secure chat with Dr. Jonel 7/1     Balance Overall balance assessment: Needs assistance Sitting-balance support: Single extremity supported, Feet supported Sitting balance-Leahy Scale: Fair Sitting balance - Comments: sitting EOB with CGA   Standing balance support: Single extremity supported, During  functional activity, Reliant on assistive device for balance Standing balance-Leahy Scale: Zero                              ADL either performed or assessed with clinical judgement   ADL Overall ADL's : Needs assistance/impaired     Grooming: Wash/dry hands;Wash/dry face;Contact guard assist;Sitting   Upper Body Bathing: Moderate assistance;Sitting       Upper Body Dressing : Moderate assistance;Sitting                     General ADL Comments: pt declined sitting up in relciner with use of Hoyer after 2 unsucessful sit- stand trials    Extremity/Trunk Assessment Upper Extremity Assessment Upper Extremity Assessment: Generalized weakness;RUE deficits/detail;LUE deficits/detail RUE Deficits / Details: Generalized weakness, pt using functionally during bed mobility, impaired grasp (loose). propping self with RUE. LUE Deficits / Details: Lt humerus non-union, splinted. no elbow ROM, but shoulder ROM ok per secure chat with Dr. Jonel 7/1            Vision Baseline Vision/History: 1 Wears glasses Ability to See in Adequate Light: 0 Adequate Patient Visual Report: No change from baseline     Perception     Praxis     Communication Communication Communication: Impaired Factors Affecting Communication: Reduced clarity of speech   Cognition Arousal: Alert Behavior During Therapy: [facility] for tasks assessed/performed                                 Following commands: Impaired Following commands impaired: Follows one step commands with increased time      Cueing   Cueing Techniques: Verbal cues  Exercises      Shoulder Instructions       General Comments      Pertinent Vitals/ Pain       Pain Assessment Pain Assessment: Faces Faces Pain Scale: Hurts a little bit Pain Location: back Pain Descriptors / Indicators: Sore, Discomfort, Grimacing Pain Intervention(s): Monitored during session, Repositioned  Home Living                                          Prior Functioning/Environment              Frequency  Min  1X/week        Progress Toward Goals  OT Goals(current goals can now be found in the care plan section)  Progress towards OT goals: OT to reassess next treatment     Plan      Co-evaluation                 AM-PAC OT 6 Clicks Daily Activity     Outcome Measure   Help from another person eating meals?: A Little Help from another person taking care of personal grooming?: A Little Help from another person toileting, which includes using toliet, bedpan, or urinal?: Total Help from another person bathing (including washing, rinsing, drying)?: A Lot Help from another person to put on and taking off regular upper body clothing?: A Lot Help from another person to put on and taking off regular lower body clothing?: Total 6 Click Score: 12    End of Session Equipment Utilized During Treatment: Gait belt  OT Visit Diagnosis: Unsteadiness on  feet (R26.81);Other abnormalities of gait and mobility (R26.89);Muscle weakness (generalized) (M62.81);Pain Pain - part of body:  (back)   Activity Tolerance Patient limited by fatigue;Patient limited by pain   Patient Left in bed;with call bell/phone within reach   Nurse Communication Mobility status        Time: 8887-8850 OT Time Calculation (min): 37 min  Charges: OT General Charges $OT Visit: 1 Visit OT Treatments $Self Care/Home Management : 8-22 mins    Jacques Karna Loose 09/16/2023, 1:59 PM

## 2023-09-16 NOTE — Progress Notes (Signed)
 SLP Cancellation Note  Patient Details Name: Frank [name]. MRN: [id number] DOB: [birth year]   Cancelled treatment:       Reason Eval/Treat Not Completed: Patient declined, no reason specified. Patient politely declined therapy today, stating that he had a bad headache. SLP will return another date.  Norleen IVAR Blase, MA, CCC-SLP Speech Therapy

## 2023-09-16 NOTE — Progress Notes (Signed)
 Physical Therapy Treatment Patient Details Name: Bud Kaeser. MRN: [id number] DOB: 06-07-1965 Today's Date: 09/16/2023   History of Present Illness 58 y.o. male transferred from [facility] 07/03/23 to [facility] for management of newly diagnosed plasma cell myeloma with weakness and AMS. Pt also with AKI and metabolic encephalopathy. 5/10 transfer to [facility] for iHD. 4/22 Intubated and chemo initiated. 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 5/6 IVIG started. 5/8-6/11 trach. 5/12 iHD initiated. 5/29 PEG placed. 6/5 chemo initiated. PMHx:Lt THA, carpal tunnel syndrome, Lt TKA, Lt humerus fx, PAF, HTN, HLD, bipolar disorder, obesity, T2DM    PT Comments  Pt received in supine and agreeable to session. Pt refuses to put splint on during session despite education. Pt agreeable to use of gait belt for support and protection of LUE during mobility. Pt requires max A +2 for bed mobility and is able to tolerate sitting EOB for exercises. Pt demonstrates BLE weakness (R>L) and requires support at the thigh to complete RLE LAQ. Pt attempts standing, but is unable to clear hips despite max A +2. Pt declines transfer to the recliner despite education and encouragement and states I can't do it. Pt continues to benefit from PT services to progress toward functional mobility goals.    If plan is discharge home, recommend the following: Two people to help with walking and/or transfers;Assistance with cooking/housework;Assist for transportation;Two people to help with bathing/dressing/bathroom;Direct supervision/assist for medications management;Direct supervision/assist for financial management;Help with stairs or ramp for entrance;Assistance with feeding   Can travel by private vehicle     No  Equipment Recommendations  Hospital bed;Hoyer lift;Wheelchair (measurements PT);Wheelchair cushion (measurements PT);BSC/3in1    Recommendations for Other Services       Precautions / Restrictions Precautions Precautions:  Fall Recall of Precautions/Restrictions: Impaired Precaution/Restrictions Comments: episode of syncope, sacral wound, chemo Restrictions Weight Bearing Restrictions Per Provider Order: Yes LUE Weight Bearing Per Provider Order: Non weight bearing Other Position/Activity Restrictions: Lt humerus non-union, splinted. no elbow ROM, but shoulder ROM ok per secure chat with Dr. Jonel 7/1     Mobility  Bed Mobility Overal bed mobility: Needs Assistance Bed Mobility: Supine to Sit, Sit to Supine     Supine to sit: +2 for physical assistance, +2 for safety/equipment, Max assist Sit to supine: Total assist, +2 for physical assistance, +2 for safety/equipment   General bed mobility comments: MaxAx2 with use of bed pad to shift hips towards EOB and raise trunk. Pt slightly able to assist with bringing legs towards EOB. TotalAx2 required for return to supine    Transfers Overall transfer level: Needs assistance Equipment used: 2 person hand held assist Transfers: Sit to/from Stand Sit to Stand: +2 physical assistance, Total assist           General transfer comment: Attempted STS from elevated EOB, but pt unable to clear bottom with max A +2 due to BLE weakness. Pt declined transfer to chair    Ambulation/Gait                   Stairs             Wheelchair Mobility     Tilt Bed    Modified Rankin (Stroke Patients Only)       Balance Overall balance assessment: Needs assistance Sitting-balance support: Single extremity supported, Feet supported Sitting balance-Leahy Scale: Fair Sitting balance - Comments: sitting EOB with CGA   Standing balance support: Single extremity supported, During functional activity, Reliant on assistive device for balance  Standing balance-Leahy Scale: Zero Standing balance comment: unable                            Communication Communication Communication: Impaired Factors Affecting Communication: Reduced clarity  of speech  Cognition Arousal: Alert Behavior During Therapy: WFL for tasks assessed/performed   PT - Cognitive impairments: Awareness, Attention, Initiation, Sequencing, Problem solving, Safety/Judgement                         Following commands: Impaired Following commands impaired: Follows one step commands with increased time    Cueing Cueing Techniques: Verbal cues  Exercises General Exercises - Lower Extremity Long Arc Quad: AROM, Seated, 5 reps, Both, AAROM Hip Flexion/Marching: AROM, Seated, Left, 5 reps    General Comments        Pertinent Vitals/Pain Pain Assessment Pain Assessment: Faces Faces Pain Scale: Hurts a little bit Pain Location: back Pain Descriptors / Indicators: Sore, Discomfort, Grimacing Pain Intervention(s): Monitored during session, Repositioned     PT Goals (current goals can now be found in the care plan section) Acute Rehab PT Goals Patient Stated Goal: to get out of bed PT Goal Formulation: With patient Time For Goal Achievement: 09/25/23 Progress towards PT goals: Progressing toward goals    Frequency    Min 1X/week       AM-PAC PT 6 Clicks Mobility   Outcome Measure  Help needed turning from your back to your side while in a flat bed without using bedrails?: A Lot Help needed moving from lying on your back to sitting on the side of a flat bed without using bedrails?: Total Help needed moving to and from a bed to a chair (including a wheelchair)?: Total Help needed standing up from a chair using your arms (e.g., wheelchair or bedside chair)?: Total Help needed to walk in hospital room?: Total Help needed climbing 3-5 steps with a railing? : Total 6 Click Score: 7    End of Session Equipment Utilized During Treatment: Gait belt Activity Tolerance: Patient limited by fatigue;Patient tolerated treatment well Patient left: in bed;with call bell/phone within reach;with bed alarm set (bed in chair position) Nurse  Communication: Mobility status PT Visit Diagnosis: Other abnormalities of gait and mobility (R26.89);Muscle weakness (generalized) (M62.81);Difficulty in walking, not elsewhere classified (R26.2);Unsteadiness on feet (R26.81)     Time: 8887-8850 PT Time Calculation (min) (ACUTE ONLY): 37 min  Charges:    $Therapeutic Activity: 8-22 mins PT General Charges $$ ACUTE PT VISIT: 1 Visit                     Darryle George, PTA Acute Rehabilitation Services Secure Chat Preferred  [phone]    Darryle George 09/16/2023, 1:17 PM

## 2023-09-17 ENCOUNTER — Telehealth: Payer: Self-pay | Admitting: Internal Medicine

## 2023-09-17 DIAGNOSIS — R6521 Severe sepsis with septic shock: Secondary | ICD-10-CM | POA: Diagnosis not present

## 2023-09-17 DIAGNOSIS — A419 Sepsis, unspecified organism: Secondary | ICD-10-CM | POA: Diagnosis not present

## 2023-09-17 LAB — GLUCOSE, CAPILLARY
Glucose-Capillary: 96 mg/dL (ref 70–99)
Glucose-Capillary: 121 mg/dL — ABNORMAL HIGH (ref 70–99)
Glucose-Capillary: 113 mg/dL — ABNORMAL HIGH (ref 70–99)
Glucose-Capillary: 141 mg/dL — ABNORMAL HIGH (ref 70–99)

## 2023-09-17 LAB — RENAL FUNCTION PANEL
Albumin: 2 g/dL — ABNORMAL LOW (ref 3.5–5.0)
Anion gap: 10 (ref 5–15)
BUN: 64 mg/dL — ABNORMAL HIGH (ref 6–20)
CO2: 23 mmol/L (ref 22–32)
Calcium: 8.3 mg/dL — ABNORMAL LOW (ref 8.9–10.3)
Chloride: 96 mmol/L — ABNORMAL LOW (ref 98–111)
Creatinine, Ser: 0.97 mg/dL (ref 0.61–1.24)
GFR, Estimated: >60 mL/min (ref >60)
Glucose, Bld: 115 mg/dL — ABNORMAL HIGH (ref 70–99)
Phosphorus: 4.8 mg/dL — ABNORMAL HIGH (ref 2.5–4.6)
Potassium: 4.4 mmol/L (ref 3.5–5.1)
Sodium: 129 mmol/L — ABNORMAL LOW (ref 135–145)

## 2023-09-17 LAB — CBC
HCT: 25.1 % — ABNORMAL LOW (ref 39.0–52.0)
Hemoglobin: 8.4 g/dL — ABNORMAL LOW (ref 13.0–17.0)
MCH: 36.2 pg — ABNORMAL HIGH (ref 26.0–34.0)
MCHC: 33.5 g/dL (ref 30.0–36.0)
MCV: 108.2 fL — ABNORMAL HIGH (ref 80.0–100.0)
Platelets: 209 10*3/uL (ref 150–400)
RBC: 2.32 MIL/uL — ABNORMAL LOW (ref 4.22–5.81)
RDW: 23.7 % — ABNORMAL HIGH (ref 11.5–15.5)
WBC: 3.9 10*3/uL — ABNORMAL LOW (ref 4.0–10.5)
nRBC: 0 % (ref 0.0–0.2)

## 2023-09-17 MED ORDER — HYDROXYZINE HCL 10 MG PO TABS
10.0000 mg | ORAL_TABLET | Freq: Three times a day (TID) | ORAL | Status: DC | PRN
  Administered 2023-09-17 – 2023-11-07 (×40): 10 mg via ORAL
  Filled 2023-09-17 (×40): qty 1

## 2023-09-17 MED ORDER — UMECLIDINIUM-VILANTEROL 62.5-25 MCG/ACT IN AEPB
1.0000 | INHALATION_SPRAY | Freq: Every day | RESPIRATORY_TRACT | Status: DC
  Administered 2023-09-18 – 2023-11-07 (×47): 1 via RESPIRATORY_TRACT
  Filled 2023-09-17 (×7): qty 14

## 2023-09-17 MED ORDER — VALPROIC ACID 250 MG/5ML PO SOLN
750.0000 mg | Freq: Two times a day (BID) | ORAL | Status: DC
  Administered 2023-09-17 – 2023-09-19 (×5): 750 mg
  Filled 2023-09-17 (×5): qty 15

## 2023-09-17 NOTE — Progress Notes (Signed)
 This chaplain is present for a spiritual care visit with the Pt.  The Pt is accepting of the visit and shares I will talk to anyone. During the visit, the chaplain learned the Pt. is frustrated with this admission.  The chaplain listened reflectively as the Pt. described the long admission and the absence of a family presence at the bedside. The Pt. shares his family is his only source of coping with what he describes as lack of communication from the medical team.   The chaplain understands the Pt. is familiar with his wife's responsibilities at home; but today the Pt. is perseverating on the time she may have to visit and how the visits have dropped off. In this setting, the Pt. accepted crossword puzzles and materials for creative exploration from the chaplain. The Pt. is hopeful therapy may be able to take him outside in a wheelchair.   The Pt. is anticipating his daughter's wedding and the birth of the second grandchild in a few months.  This chaplain is available for F/U spiritual care as needed.  Chaplain Leeroy Hummer [phone]

## 2023-09-17 NOTE — Progress Notes (Signed)
 Physical Therapy Wound Treatment Patient Details  Name: Frank [name]. MRN: [id number] Date of Birth: Aug 17, 1965  Today's Date: 09/17/2023 Time: 1140-1208 Time Calculation (min): 28 min  Subjective  Subjective Assessment Patient and Family Stated Goals: to be able to see his grandson Date of Onset:  (exact date unknown) Prior Treatments: Sacral foam  Pain Score:  6/10  Wound Assessment  Pressure Injury 07/11/23 Sacrum Mid Unstageable - Full thickness tissue loss in which the base of the injury is covered by slough (yellow, tan, gray, green or brown) and/or eschar (tan, brown or black) in the wound bed. (Active)  Wound Image   09/17/23 1300  Dressing Type Foam - Lift dressing to assess site every shift;Gauze (Comment) 09/17/23 1300  Dressing Changed 09/17/23 1300  Dressing Change Frequency Daily 09/17/23 1300  State of Healing Early/partial granulation 09/17/23 1300  Site / Wound Assessment Clean;Yellow;Pink;Pale;Granulation tissue 09/17/23 1300  % Wound base Red or Granulating 15% 09/17/23 1300  % Wound base Yellow/Fibrinous Exudate 85% 09/17/23 1300  % Wound base Black/Eschar 0% 09/17/23 1300  % Wound base Other/Granulation Tissue (Comment) 0% 09/17/23 1300  Peri-wound Assessment Erythema (blanchable);Maceration 09/17/23 1300  Wound Length (cm) 4.5 cm 09/17/23 1300  Wound Width (cm) 3 cm 09/17/23 1300  Wound Depth (cm) 0 cm 09/10/23 1617  Wound Surface Area (cm^2) 13.5 cm^2 09/17/23 1300  Wound Volume (cm^3) 0 cm^3 09/10/23 1617  Margins Unattached edges (unapproximated) 09/17/23 1300  Drainage Amount None 09/17/23 1300  Drainage Description No odor 09/17/23 1300  Treatment Packing (Saline gauze) 09/17/23 1300           Wound Assessment and Plan  Wound Therapy - Assess/Plan/Recommendations Wound Therapy - Clinical Statement: Pt given pain medication prior to session and process was explained to him. However, when patient was over on his side while PT was performing  measurements pt began breathing rapidly and yelling turn me over Pt barely able to place clean bandage before turning him over. Pt instructed in focused deep breathing. Pt becomes tearful and reports he just wants to see his grandson. PT provided education on wound care to heal his wound so that he will be able to tolerate sitting in wheelchair and discharge home. Pt sacral wound has appreciable amount of stringy yellow slough. Pt will benefit from selective debidement to decrease bioburden to facilitate faster healing. PT will request anxiety and pain medication prior to subsequent treatment sessions. Wound Therapy - Functional Problem List: decreased mobility, cancer and chemo Factors Delaying/Impairing Wound Healing: Diabetes Mellitus, Multiple medical problems, Immobility, Polypharmacy Hydrotherapy Plan: Debridement, Dressing change, Patient/family education Wound Therapy - Frequency: 2X / week Wound Therapy - Current Recommendations: OT, PT, Case manager/social work Wound Therapy - Follow Up Recommendations: dressing changes by RN  Wound Therapy Goals- Improve the function of patient's integumentary system by progressing the wound(s) through the phases of wound healing (inflammation - proliferation - remodeling) by: Wound Therapy Goals - Improve the function of patient's integumentary system by progressing the wound(s) through the phases of wound healing by: Decrease Necrotic Tissue to: 50 Decrease Necrotic Tissue - Progress: Goal set today Increase Granulation Tissue to: 30 Increase Granulation Tissue - Progress: Goal set today Decrease Length/Width/Depth by (cm): L and W by 0.5 cm Decrease Length/Width/Depth - Progress: Goal set today Goals/treatment plan/discharge plan were made with and agreed upon by patient/family: Yes Time For Goal Achievement: 7 days Wound Therapy - Potential for Goals: Fair  Goals will be updated until maximal potential achieved or discharge  criteria met.   Discharge criteria: when goals achieved, discharge from hospital, MD decision/surgical intervention, no progress towards goals, refusal/missing three consecutive treatments without notification or medical reason.  GP     Charges PT Wound Care Charges $Wound Debridement up to 20 cm: < or equal to 20 cm $PT Hydrotherapy Dressing: 1 dressing $PT Hydrotherapy Visit: 1 Visit     Jacek Colson B. Fleeta Lapidus PT, DPT Acute Rehabilitation Services Please use secure chat or  Call Office [phone]   [name] 09/17/2023, 2:18 PM

## 2023-09-17 NOTE — Progress Notes (Signed)
 Speech Language Pathology Treatment: Dysphagia  Patient Details Name: Frank [name]. MRN: [id number] DOB: 07-Oct-1965 Today's Date: 09/17/2023 Time: 9061-9046 SLP Time Calculation (min) (ACUTE ONLY): 15 min  Assessment / Plan / Recommendation Clinical Impression  Frank [name] was in good spirits this am.  After initial cue, able to carry-out precautions consistently.  States that he coughs because he was told to do so.  Chart review reveals clear/diminished breath sounds- stable over the last several days. He continues to tolerate PO diet without adverse consequences.   Engaged in use of EMST, 5 reps over three sets.  He reports 5/10 resistance.  Recommended that he continue practicing on his own - device within reach on bedside table.   SLP will continue to follow.    HPI HPI: 58 y.o. male transferred from [facility] 07/03/23 to [facility] for management of newly diagnosed plasma cell myeloma with weakness and AMS. Pt also with AKI and metabolic encephalopathy. 5/10 transfer to [facility] for iHD.Bedside swallow eval 4/18 with recs for NPO due to somnolence. 4/22 Intubated and chemo initiated. 4/23-5/10 CRRT. 5/6 IVIG started. 5/8 trach. 5/12 iHD initiated. 5/18 inability to oxygenate through trach, intubated and trach revision. Subsequently decannulated. PEG. PMHx:Lt THA, carpal tunnel syndrome, Lt TKA, Lt humerus fx, PAF, HTN, HLD, bipolar disorder, obesity, T2DM.      SLP Plan  Continue with current plan of care          Recommendations  Liquids provided via: Cup;Straw Medication Administration: Via alternative means Supervision: Patient able to self feed;Intermittent supervision to cue for compensatory strategies Compensations: Slow rate;Small sips/bites;Follow solids with liquid;Clear throat after each swallow;Effortful swallow Postural Changes and/or Swallow Maneuvers: Seated upright 90 degrees;Upright 30-60 min after meal                  Oral care BID           Continue with  current plan of care   Frank Keziah L. Vona, MA CCC/SLP Clinical Specialist - Acute Care SLP Acute Rehabilitation Services Office number [phone]   Frank [name]  09/17/2023, 10:03 AM

## 2023-09-17 NOTE — Telephone Encounter (Signed)
 Qasim's wife, [name] called to cancel pt's upcoming appt due to hospitalization. She wanted Dr. Overton to advise if the appt should be rescheduled due to his antibiotics but pt does not have a discharge date. I informed her our providers may do rounds while he is admitted and other providers may recommend a follow-up on his AVS. [name] can be reached at [phone], if needed.

## 2023-09-17 NOTE — Progress Notes (Signed)
 PROGRESS NOTE  Frank [name]. FMW:[id number] DOB: 01-14-1966 DOA: 07/03/2023 PCP: [name], MD   LOS: 76 days   Brief Narrative / Interim history: 58 year old PMH OSA CPAP, pathological fracture and multiple ORIF left Humerus likely due to multiple myeloma, A-fib not on anticoagulation, hyperlipidemia, tobacco use disorder who was  feeling weak for 1 week, admitted  to [facility] health with concern of sepsis, community-acquired pneumonia and acute encephalopathy.  Patient was found to have lytic lesions,  he was transferred to [facility] for  oncology treatment.  In the meantime he developed encephalopathy, acute renal failure.  He remained in the ICU for 36 days.  Underwent tracheostomy. He was also started on HD after  CRRT.  Subsequently PEG tube placed 5/29.   Significant events 4/17-Admitted to medical floor with oncology consultation to treat multiple myeloma. 4/19-transferred to ICU intubated, encephalopathic. 4/20-A-fib with RVR. Started heparin . Groin and biopsy site hematoma requiring multiple transfusions. EEG 4/25 >> moderate to severe encephalopathy without any seizures or epileptiform discharges. 4/29 Received Velcade  and Cytoxan  chemotherapy-intent palliative? 5/2 LP and ETT exchange  5/8 Trach placed. Some post op bleeding improved with thrombi-pad 5/10 Voriconazole  added back based on CT Chest findings 5/13 more alert and able to follow some commands. 5/23, transferred to floor. 5/29-PEG placed 5/31 chemotherapy changed to Sarclisa /Kyprolis /Cytoxan . 6/13 plan to remove HD cathter for holiday line 6/16 new HD catheter placed 6/23 has not required HD for a week, HD catheter removed 6/28 finished first cycle of chemo, next cycle to start in 2 weeks  Subjective / 24h Interval events: No specific complaints, wants me to help with his breakfast  Assesement and Plan: Principal Problem:   Severe sepsis with septic shock (HCC) Active Problems:   Aspergillosis,  unspecified (HCC)   Multiple myeloma not having achieved remission (HCC)   Lobar pneumonia, unspecified organism (HCC)   Closed fracture of left distal humerus   AKI (acute kidney injury) (HCC)   Acute hypoxemic respiratory failure (HCC)   PAF (paroxysmal atrial fibrillation) (HCC)   Acute metabolic encephalopathy   Protein-calorie malnutrition, severe   Hematoma of right thigh   Seizure disorder (HCC)   DMII (diabetes mellitus, type 2) (HCC)   Pressure injury of skin   CAD (coronary artery disease)   Chronic diastolic CHF (congestive heart failure) (HCC)   CKD (chronic kidney disease), stage IIIa (HCC)   Class 1 obesity   Asthma   Bipolar disorder (HCC)   Febrile neutropenia (HCC)   Principal problem Recurrent fevers, sepsis, Staph Epi Bacteremia -Hospital course complicated by intermittent fevers, ID consulted and most recently evaluated patient 6/17.  He had a fever and MRSE bacteremia 1/4 bottles, completed a course of vancomycin  with the end date 6/20.  He did receive a line holiday.  Repeat blood culture 6/9 without growth. - He has unstageable sacral ulcer, continue local wound care.  He remains afebrile and stable  Active problems  Aspergillosis with pneumonia -On Voriconazole  per ID, On Acyclovir .  End date for voriconazole  is 8/5, continue   Multiple myeloma -oncology following. Started on Velcade  and Cytoxan  by Oncology, transitioned to Kyprolis  then Sarclisa .   - Sarclisa  per Oncology - Next dose chemo due ~7/14   Goals of care - Unfortunately poor long-term prognosis. Palliative care has discussed with wife previously.  Remain full code on full scope of care   Acute Hypoxic Respiratory Failure - Pulmonology following intermittently, trach removed 6/11.  Respiratory status is stable, on minimal oxygen  today, continue supportive care   Right Thigh Hematoma - per CT on 08/01/23: 12.1 x 30.2 x 7.6 cm subcutaneous hematoma within the anterior right thigh. 8.4 x 10.4 x  22.2 cm subcutaneous hematoma within the right gluteal region - continue supportive care.  Hemoglobin stable   Severe acute metabolic encephalopathy with hypoactive delirium, seizure disorder - Normal MRI of the brain.  LP negative for meningitis or encephalitis.  EEG negative for seizures but encephalopathy. Home Depakote  discontinued in ICU.   Insulin -dependent diabetes type 2 with hyperglycemia- A1c 6.0, continue with sliding scale insulin   CBG (last 3)  Recent Labs    09/16/23 1804 09/16/23 2125 09/17/23 0620  GLUCAP 108* 134* 96    AKI on CKD stage IIIa, Uremia,  Hyponatremia, Hypokalemia -Due to MM, hypercalcemia and ACE inhibitors?  Nephrology consulted, was on hemodialysis but eventually he tolerated being off HD for the past week, tunneled catheter discontinued per nephrology.  Renal function has recovered   Paroxysmal A-fib - Unable to  anticoagulation due to hematoma. Rate controlled.    Obstructive Sleep Apnea -Trach removed   Anxiety/Bipolar disorder - Continue with Paxil    Severe Malnutrition - Continue tube feeding.  He is allowed clear liquid per speech   History of Pathological fracture and multiple ORIF left Humerus 01/2023 -Dr. Jonel discussed with orthopedics on 6/26, options would involve either a substantial reconstruction with antibiotic cement and prolonged, difficult recovery, or above elbow amputation.  [name] recommends more rehabilitation at this time prior to attempting surgery. - Maintain left arm splint when out of bed - Follow-up with [name] after discharge   Hypokalemia -potassium stable   Hyponatremia -sodium overall stable    Scheduled Meds:  acyclovir   400 mg Oral BID   arformoterol   15 mcg Nebulization BID   feeding supplement (NEPRO CARB STEADY)  237 mL Per Tube TID PC & HS   feeding supplement (PROSource TF20)  60 mL Per Tube BID   heparin  injection (subcutaneous)  5,000 Units Subcutaneous Q8H   heparin  sodium (porcine)  10,000  Units Intravenous Once   insulin  aspart  0-15 Units Subcutaneous TID AC & HS   insulin  aspart  2 Units Subcutaneous TID AC & HS   insulin  glargine-yfgn  15 Units Subcutaneous Q24H   montelukast   5 mg Oral QHS   multivitamin with minerals  1 tablet Per Tube Daily   nicotine   7 mg Transdermal Daily   nutrition supplement (JUVEN)  1 packet Oral BID BM   mouth rinse  15 mL Mouth Rinse 4 times per day   PARoxetine   40 mg Oral Daily   pneumococcal 20-valent conjugate vaccine  0.5 mL Intramuscular Tomorrow-1000   polyethylene glycol  17 g Oral BID   revefenacin   175 mcg Nebulization Daily   senna  1 tablet Per Tube Daily   sodium chloride  flush  10-40 mL Intracatheter Q12H   sodium hypochlorite   Irrigation Daily   voriconazole   350 mg Oral Q12H   Continuous Infusions:  sodium chloride  Stopped (08/28/23 1208)   sodium chloride  Stopped (08/28/23 9160)   valproate sodium  750 mg (09/16/23 2132)   PRN Meds:.acetaminophen  (TYLENOL ) oral liquid 160 mg/5 mL **OR** acetaminophen , hydrALAZINE , hydrOXYzine , ipratropium-albuterol , ondansetron  (ZOFRAN ) IV, oxyCODONE , artificial tears, sodium chloride  flush  Current Outpatient Medications  Medication Instructions   albuterol  (PROVENTIL  HFA;VENTOLIN  HFA) 108 (90 Base) MCG/ACT inhaler 2 puffs, Inhalation, Every 6 hours PRN   albuterol  (PROVENTIL ) 2.5 mg, Every 2 hours PRN   aspirin   EC 81 mg, Oral, Daily, Swallow whole.   buPROPion (WELLBUTRIN XL) 150 mg, Daily   busPIRone  (BUSPAR ) 15 mg, Oral, Daily at bedtime   celecoxib  (CELEBREX ) 200 mg, Oral, Daily   clonazePAM  (KLONOPIN ) 1 mg, Oral, Once PRN   cyclobenzaprine  (FLEXERIL ) 5 mg, Oral, 3 times daily PRN   divalproex  (DEPAKOTE ) 500 mg, Oral, 2 times daily   EPINEPHrine  (EPI-PEN) 0.3 mg, Intramuscular, As needed   esomeprazole (NEXIUM) 40 mg, Oral, Every morning   Fluticasone -Salmeterol (ADVAIR) 500-50 MCG/DOSE AEPB 2 puffs, Inhalation, Every morning   furosemide  (LASIX ) 40 mg, Oral, Daily PRN    gabapentin  (NEURONTIN ) 300-600 mg, Oral, See admin instructions, 300 mg in the morning, 600 mg at bedtime    hydrALAZINE  (APRESOLINE ) 10 mg, Oral, 2 times daily   hydrochlorothiazide  (HYDRODIURIL ) 25 mg, Oral, Daily   lisinopril  (ZESTRIL ) 40 mg, Oral, Daily   metFORMIN  (GLUCOPHAGE ) 1,000 mg, Oral, 2 times daily with meals   montelukast  (SINGULAIR ) 10 mg, Oral, Daily at bedtime   OVER THE COUNTER MEDICATION 400 mg, Oral, Daily at bedtime, Burdock root    oxyCODONE  (ROXICODONE ) 5 mg, Oral, Every 4 hours PRN   PARoxetine  (PAXIL ) 40 mg, Oral, BH-each morning   spironolactone  (ALDACTONE ) 25 mg, Oral, Daily at bedtime   tamsulosin  (FLOMAX ) 0.4 mg, Oral, Daily at bedtime   testosterone  cypionate (DEPOTESTOSTERONE CYPIONATE) 100 mg, Intramuscular, Every 14 days   verapamil  (CALAN -SR) 120 mg, Oral, Daily   Vitamin D  (Ergocalciferol ) (DRISDOL ) 50,000 Units, Oral, Every Thu    Diet Orders (From admission, onward)     Start     Ordered   09/10/23 0810  DIET - DYS 1 Room service appropriate? No; Fluid consistency: Thin; Fluid restriction: 1200 mL Fluid  Diet effective now       Comments: No dairy, clear liquids only  Question Answer Comment  Room service appropriate? No   Fluid consistency: Thin   Fluid restriction: 1200 mL Fluid      09/10/23 0809            DVT prophylaxis: heparin  injection 5,000 Units Start: 08/14/23 2200 Place and maintain sequential compression device Start: 07/14/23 1033   Lab Results  Component Value Date   PLT 219 09/16/2023      Code Status: Full Code  Family Communication: no family at bedside   Status is: Inpatient Remains inpatient appropriate because: severity of illness  Level of care: Med-Surg  Consultants:  ID Nephrology Oncology  Objective: Vitals:   09/16/23 2321 09/17/23 0336 09/17/23 0721 09/17/23 0855  BP: (!) 154/99 129/87 (!) 109/93   Pulse: 92 86 89   Resp: 18 16 18    Temp: 98.4 F (36.9 C) 98.2 F (36.8 C) 99.4 F (37.4  C)   TempSrc: Oral Oral Oral   SpO2: 96% 98% 94% 99%  Weight:      Height:        Intake/Output Summary (Last 24 hours) at 09/17/2023 1021 Last data filed at 09/17/2023 0934 Gross per 24 hour  Intake 600 ml  Output 3050 ml  Net -2450 ml   Wt Readings from Last 3 Encounters:  09/15/23 103 kg  06/05/23 113.7 kg  01/29/23 117.9 kg    Examination:  Constitutional: NAD Eyes: lids and conjunctivae normal, no scleral icterus ENMT: mmm Neck: normal, supple Respiratory: clear to auscultation bilaterally, no wheezing, no crackles. Normal respiratory effort.  Cardiovascular: Regular rate and rhythm, no murmurs / rubs / gallops. No LE edema. Abdomen: soft, no distention, no tenderness.  Bowel sounds positive.    Data Reviewed: I have independently reviewed following labs and imaging studies   CBC Recent Labs  Lab 09/12/23 0921 09/13/23 1742 09/14/23 0721 09/15/23 0504 09/16/23 1224  WBC 5.8 5.1 6.2 5.5 5.6  HGB 9.4* 8.8* 8.5* 8.9* 8.8*  HCT 28.2* 27.2* 26.3* 27.6* 26.5*  PLT 216 221 216 211 219  MCV 107.6* 109.2* 110.5* 110.4* 109.1*  MCH 35.9* 35.3* 35.7* 35.6* 36.2*  MCHC 33.3 32.4 32.3 32.2 33.2  RDW 25.0* 24.6* 25.0* 24.6* 24.0*  LYMPHSABS 0.8 1.1 0.4* 0.7 0.8  MONOABS 0.8 0.6 0.4 0.6 0.6  EOSABS 0.0 0.0 0.0 0.0 0.0  BASOSABS 0.0 0.0 0.0 0.0 0.0    Recent Labs  Lab 09/12/23 0921 09/12/23 0922 09/13/23 1742 09/14/23 0721 09/15/23 0504 09/16/23 1224  NA 130* 129* 130* 130* 129* 132*  K 3.9 3.9 4.1 4.4 4.8 4.5  CL 97* 98 95* 96* 96* 99  CO2 24 20* 24 21* 25 24  GLUCOSE 101* 98 197* 102* 123* 116*  BUN 60* 60* 68* 62* 61* 60*  CREATININE 1.06 1.01 1.07 1.09 1.06 0.99  CALCIUM  8.7* 8.6* 8.5* 8.0* 8.2* 8.6*  AST 19  --   --   --   --   --   ALT 12  --   --   --   --   --   ALKPHOS 113  --   --   --   --   --   BILITOT 0.3  --   --   --   --   --   ALBUMIN  2.0* 2.0* 1.9* 1.9* 1.9* 2.0*     ------------------------------------------------------------------------------------------------------------------ No results for input(s): CHOL, HDL, LDLCALC, TRIG, CHOLHDL, LDLDIRECT in the last 72 hours.  Lab Results  Component Value Date   HGBA1C 6.0 (H) 08/14/2023   ------------------------------------------------------------------------------------------------------------------ No results for input(s): TSH, T4TOTAL, T3FREE, THYROIDAB in the last 72 hours.  Invalid input(s): FREET3  Cardiac Enzymes No results for input(s): CKMB, TROPONINI, MYOGLOBIN in the last 168 hours.  Invalid input(s): CK ------------------------------------------------------------------------------------------------------------------    Component Value Date/Time   BNP 301.2 (H) 07/23/2023 0816    CBG: Recent Labs  Lab 09/16/23 0618 09/16/23 1233 09/16/23 1804 09/16/23 2125 09/17/23 0620  GLUCAP 124* 130* 108* 134* 96    No results found for this or any previous visit (from the past 240 hours).    Radiology Studies: No results found.   Nilda Fendt, MD, PhD [facility]  Between 7 am - 7 pm I am available, please contact me via Amion (for emergencies) or Securechat (non urgent messages)  Between 7 pm - 7 am I am not available, please contact night coverage MD/APP via Amion

## 2023-09-18 DIAGNOSIS — R6521 Severe sepsis with septic shock: Secondary | ICD-10-CM | POA: Diagnosis not present

## 2023-09-18 DIAGNOSIS — A419 Sepsis, unspecified organism: Secondary | ICD-10-CM | POA: Diagnosis not present

## 2023-09-18 LAB — GLUCOSE, CAPILLARY
Glucose-Capillary: 89 mg/dL (ref 70–99)
Glucose-Capillary: 82 mg/dL (ref 70–99)
Glucose-Capillary: 109 mg/dL — ABNORMAL HIGH (ref 70–99)
Glucose-Capillary: 152 mg/dL — ABNORMAL HIGH (ref 70–99)
Glucose-Capillary: 192 mg/dL — ABNORMAL HIGH (ref 70–99)

## 2023-09-18 LAB — RENAL FUNCTION PANEL
Albumin: 2 g/dL — ABNORMAL LOW (ref 3.5–5.0)
Anion gap: 8 (ref 5–15)
BUN: 59 mg/dL — ABNORMAL HIGH (ref 6–20)
CO2: 24 mmol/L (ref 22–32)
Calcium: 8.1 mg/dL — ABNORMAL LOW (ref 8.9–10.3)
Chloride: 99 mmol/L (ref 98–111)
Creatinine, Ser: 0.98 mg/dL (ref 0.61–1.24)
GFR, Estimated: >60 mL/min (ref >60)
Glucose, Bld: 91 mg/dL (ref 70–99)
Phosphorus: 4.2 mg/dL (ref 2.5–4.6)
Potassium: 4.4 mmol/L (ref 3.5–5.1)
Sodium: 131 mmol/L — ABNORMAL LOW (ref 135–145)

## 2023-09-18 MED ORDER — MELATONIN 3 MG PO TABS
3.0000 mg | ORAL_TABLET | Freq: Once | ORAL | Status: AC | PRN
Start: 2023-09-18 — End: 2023-09-18
  Administered 2023-09-18: 3 mg via ORAL
  Filled 2023-09-18: qty 1

## 2023-09-18 MED ORDER — ENOXAPARIN SODIUM 60 MG/0.6ML IJ SOSY
50.0000 mg | PREFILLED_SYRINGE | Freq: Every day | INTRAMUSCULAR | Status: DC
  Administered 2023-09-18 – 2023-09-30 (×13): 50 mg via SUBCUTANEOUS
  Filled 2023-09-18 (×14): qty 0.6

## 2023-09-18 NOTE — Progress Notes (Signed)
 Physical Therapy Treatment Patient Details Name: Frank [name]. MRN: [id number] DOB: 12-Oct-1965 Today's Date: 09/18/2023   History of Present Illness 58 y.o. male transferred from [facility] 07/03/23 to [facility] for management of newly diagnosed plasma cell myeloma with weakness and AMS. Pt also with AKI and metabolic encephalopathy. 5/10 transfer to [facility] for iHD. 4/22 Intubated and chemo initiated. 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 5/6 IVIG started. 5/8-6/11 trach. 5/12 iHD initiated. 5/29 PEG placed. 6/5 chemo initiated. PMHx:Lt THA, carpal tunnel syndrome, Lt TKA, Lt humerus fx, PAF, HTN, HLD, bipolar disorder, obesity, T2DM   PT Comments  Pt in bed upon arrival and agreeable to PT session. Pt continues to be limited by fatigue and feeling SOB with mobility, VSS on RA. MaxAx2 to TotalAx2 required for bed mobility. Discussed using hoyer lift to get to recliner with pt declining at this time. After talking about the importance of getting out of bed, pt was agreeable to attempt in future sessions (geomat ordered). Attempted x3 STS with use of Stedy and MaxAx2. Pt able to grip horizontal bar with R hand with support needed for L UE. Pt able to clear bottom with progression to ~50% upright posture on last stand attempt. Rest breaks in between stand attempts due to pt feeling SOB. Pt is progressing slowly towards goals. Continue to recommend <3hrs post acute rehab. Acute PT to follow.      If plan is discharge home, recommend the following: Two people to help with walking and/or transfers;Assistance with cooking/housework;Assist for transportation;Two people to help with bathing/dressing/bathroom;Direct supervision/assist for medications management;Direct supervision/assist for financial management;Help with stairs or ramp for entrance;Assistance with feeding   Can travel by private vehicle     No  Equipment Recommendations  Hospital bed;Hoyer lift;Wheelchair (measurements PT);Wheelchair cushion  (measurements PT);BSC/3in1       Precautions / Restrictions Precautions Precautions: Fall Recall of Precautions/Restrictions: Impaired Precaution/Restrictions Comments: episode of syncope, sacral wound, chemo. Lt humerus non-union, splinted. no elbow ROM, but shoulder ROM ok per secure chat with Dr. Jonel 7/1 Restrictions Weight Bearing Restrictions Per Provider Order: Yes LUE Weight Bearing Per Provider Order: Non weight bearing Other Position/Activity Restrictions: Lt humerus non-union, splinted. no elbow ROM, but shoulder ROM ok per secure chat with Dr. Jonel 7/1     Mobility  Bed Mobility Overal bed mobility: Needs Assistance Bed Mobility: Supine to Sit, Sit to Supine Rolling: Max assist Sidelying to sit: Max assist, +2 for physical assistance, +2 for safety/equipment   Sit to supine: Total assist, +2 for physical assistance, +2 for safety/equipment   General bed mobility comments: MaxAx2 with use of bed pad to shift hips. Assist to raise trunk and support L UE. TotalAx2 for return to supine    Transfers Overall transfer level: Needs assistance Equipment used: Ambulation equipment used Transfers: Sit to/from Stand Sit to Stand: Max assist, +2 physical assistance, +2 safety/equipment      General transfer comment: Attempted STS with use of bed pad/gait belt and R UE on stedy. Able to clear bottom with pt reaching ~50% upright posture on 3rd attempt. Declined transfer with hoyer lift Transfer via Lift Equipment: Stedy         Balance Overall balance assessment: Needs assistance Sitting-balance support: Single extremity supported, Feet supported Sitting balance-Leahy Scale: Fair Sitting balance - Comments: sitting EOB with CGA   Standing balance support: Single extremity supported, During functional activity, Reliant on assistive device for balance Standing balance-Leahy Scale: Zero Standing balance comment: unable  Communication Communication Communication:  Impaired Factors Affecting Communication: Reduced clarity of speech  Cognition Arousal: Alert Behavior During Therapy: WFL for tasks assessed/performed   PT - Cognitive impairments: Sequencing, Problem solving, Safety/Judgement      PT - Cognition Comments: cues for sequencing and step by step instructions for mobility Following commands: Impaired Following commands impaired: Follows one step commands with increased time    Cueing Cueing Techniques: Verbal cues, Tactile cues, Visual cues  Exercises General Exercises - Lower Extremity Long Arc Quad: AROM, Both, 5 reps, Seated Other Exercises Other Exercises: x3 STS attempts with MaxAx2 and Stedy        Pertinent Vitals/Pain Pain Assessment Pain Assessment: Faces Faces Pain Scale: Hurts little more Pain Location: bottom Pain Descriptors / Indicators: Sore, Discomfort, Grimacing Pain Intervention(s): Monitored during session, Limited activity within patient's tolerance, Repositioned     PT Goals (current goals can now be found in the care plan section) Acute Rehab PT Goals PT Goal Formulation: With patient Time For Goal Achievement: 09/25/23 Potential to Achieve Goals: Fair Progress towards PT goals: Progressing toward goals    Frequency    Min 1X/week       AM-PAC PT 6 Clicks Mobility   Outcome Measure  Help needed turning from your back to your side while in a flat bed without using bedrails?: A Lot Help needed moving from lying on your back to sitting on the side of a flat bed without using bedrails?: Total Help needed moving to and from a bed to a chair (including a wheelchair)?: Total Help needed standing up from a chair using your arms (e.g., wheelchair or bedside chair)?: Total Help needed to walk in hospital room?: Total Help needed climbing 3-5 steps with a railing? : Total 6 Click Score: 7    End of Session Equipment Utilized During Treatment: Gait belt Activity Tolerance: Patient tolerated  treatment well Patient left: in bed;with call bell/phone within reach Nurse Communication: Mobility status (need for Geo mat) PT Visit Diagnosis: Other abnormalities of gait and mobility (R26.89);Muscle weakness (generalized) (M62.81);Difficulty in walking, not elsewhere classified (R26.2);Unsteadiness on feet (R26.81)     Time: 9165-9097 PT Time Calculation (min) (ACUTE ONLY): 28 min  Charges:    $Therapeutic Exercise: 8-22 mins $Therapeutic Activity: 8-22 mins PT General Charges $$ ACUTE PT VISIT: 1 Visit                     Kate ORN, PT, DPT Secure Chat Preferred  Rehab Office [phone]   Kate BRAVO Wendolyn 09/18/2023, 9:34 AM

## 2023-09-18 NOTE — Care Plan (Incomplete)
 Wife calling patient and causing patient to panic about medications, she THINK he is receiving.  I had to inform and educate the patient and his wife

## 2023-09-18 NOTE — Plan of Care (Signed)
  Problem: Education: Goal: Knowledge of General Education information will improve Description: Including pain rating scale, medication(s)/side effects and non-pharmacologic comfort measures Outcome: Progressing   Problem: Health Behavior/Discharge Planning: Goal: Ability to manage health-related needs will improve Outcome: Progressing   Problem: Clinical Measurements: Goal: Ability to maintain clinical measurements within normal limits will improve Outcome: Progressing Goal: Will remain free from infection Outcome: Progressing Goal: Diagnostic test results will improve Outcome: Progressing Goal: Respiratory complications will improve Outcome: Progressing Goal: Cardiovascular complication will be avoided Outcome: Progressing   Problem: Activity: Goal: Risk for activity intolerance will decrease Outcome: Progressing   Problem: Nutrition: Goal: Adequate nutrition will be maintained Outcome: Progressing   Problem: Coping: Goal: Level of anxiety will decrease Outcome: Progressing   Problem: Elimination: Goal: Will not experience complications related to bowel motility Outcome: Progressing Goal: Will not experience complications related to urinary retention Outcome: Progressing   Problem: Pain Managment: Goal: General experience of comfort will improve and/or be controlled Outcome: Progressing   Problem: Safety: Goal: Ability to remain free from injury will improve Outcome: Progressing   Problem: Skin Integrity: Goal: Risk for impaired skin integrity will decrease Outcome: Progressing   Problem: Education: Goal: Ability to describe self-care measures that may prevent or decrease complications (Diabetes Survival Skills Education) will improve Outcome: Progressing Goal: Individualized Educational Video(s) Outcome: Progressing   Problem: Coping: Goal: Ability to adjust to condition or change in health will improve Outcome: Progressing   Problem: Fluid  Volume: Goal: Ability to maintain a balanced intake and output will improve Outcome: Progressing   Problem: Health Behavior/Discharge Planning: Goal: Ability to identify and utilize available resources and services will improve Outcome: Progressing Goal: Ability to manage health-related needs will improve Outcome: Progressing   Problem: Metabolic: Goal: Ability to maintain appropriate glucose levels will improve Outcome: Progressing   Problem: Nutritional: Goal: Maintenance of adequate nutrition will improve Outcome: Progressing Goal: Progress toward achieving an optimal weight will improve Outcome: Progressing   Problem: Skin Integrity: Goal: Risk for impaired skin integrity will decrease Outcome: Progressing   Problem: Tissue Perfusion: Goal: Adequacy of tissue perfusion will improve Outcome: Progressing   Problem: Education: Goal: Knowledge about tracheostomy care/management will improve Outcome: Progressing   Problem: Activity: Goal: Ability to tolerate increased activity will improve Outcome: Progressing   Problem: Health Behavior/Discharge Planning: Goal: Ability to manage tracheostomy will improve Outcome: Progressing   Problem: Respiratory: Goal: Patent airway maintenance will improve Outcome: Progressing   Problem: Role Relationship: Goal: Ability to communicate will improve Outcome: Progressing   Problem: Education: Goal: Knowledge of disease and its progression will improve Outcome: Progressing Goal: Individualized Educational Video(s) Outcome: Progressing   Problem: Fluid Volume: Goal: Compliance with measures to maintain balanced fluid volume will improve Outcome: Progressing   Problem: Health Behavior/Discharge Planning: Goal: Ability to manage health-related needs will improve Outcome: Progressing   Problem: Nutritional: Goal: Ability to make healthy dietary choices will improve Outcome: Progressing

## 2023-09-18 NOTE — Progress Notes (Signed)
 This chaplain is present for F/U spiritual care. The Pt. is awake and waiting for medication for anxiety. The chaplain understands the RN is aware of the Pt. request. The chaplain appreciates the NT words of support.  The chaplain listened reflectively as the Pt. shared his hope for a day free from anxiety. The Pt. accepted the chaplain's invitation and materials for creating a card for his grandson. Plans were made for mailing the card early next week. A crossword puzzle was left for the Pt. per the Pt. request.  This chaplain plans to revisit on Monday. F/U spiritual care is also available through the spiritual care at [phone].  Chaplain Leeroy Hummer [phone]

## 2023-09-18 NOTE — Progress Notes (Signed)
 PROGRESS NOTE  Frank [name]. FMW:[id number] DOB: 1965-12-05 DOA: 07/03/2023 PCP: [name], MD   LOS: 77 days   Brief Narrative / Interim history: 58 year old PMH OSA CPAP, pathological fracture and multiple ORIF left Humerus likely due to multiple myeloma, A-fib not on anticoagulation, hyperlipidemia, tobacco use disorder who was  feeling weak for 1 week, admitted  to [facility] health with concern of sepsis, community-acquired pneumonia and acute encephalopathy.  Patient was found to have lytic lesions,  he was transferred to [facility] for  oncology treatment.  In the meantime he developed encephalopathy, acute renal failure.  He remained in the ICU for 36 days.  Underwent tracheostomy. He was also started on HD after  CRRT.  Subsequently PEG tube placed 5/29.   Significant events 4/17-Admitted to medical floor with oncology consultation to treat multiple myeloma. 4/19-transferred to ICU intubated, encephalopathic. 4/20-A-fib with RVR. Started heparin . Groin and biopsy site hematoma requiring multiple transfusions. EEG 4/25 >> moderate to severe encephalopathy without any seizures or epileptiform discharges. 4/29 Received Velcade  and Cytoxan  chemotherapy-intent palliative? 5/2 LP and ETT exchange  5/8 Trach placed. Some post op bleeding improved with thrombi-pad 5/10 Voriconazole  added back based on CT Chest findings 5/13 more alert and able to follow some commands. 5/23, transferred to floor. 5/29-PEG placed 5/31 chemotherapy changed to Sarclisa /Kyprolis /Cytoxan . 6/13 plan to remove HD cathter for holiday line 6/16 new HD catheter placed 6/23 has not required HD for a week, HD catheter removed 6/28 finished first cycle of chemo, next cycle to start in 2 weeks  Subjective / 24h Interval events: No complaints this morning, doesn't engage much in a conversation  Assesement and Plan: Principal Problem:   Severe sepsis with septic shock (HCC) Active Problems:   Aspergillosis,  unspecified (HCC)   Multiple myeloma not having achieved remission (HCC)   Lobar pneumonia, unspecified organism (HCC)   Closed fracture of left distal humerus   AKI (acute kidney injury) (HCC)   Acute hypoxemic respiratory failure (HCC)   PAF (paroxysmal atrial fibrillation) (HCC)   Acute metabolic encephalopathy   Protein-calorie malnutrition, severe   Hematoma of right thigh   Seizure disorder (HCC)   DMII (diabetes mellitus, type 2) (HCC)   Pressure injury of skin   CAD (coronary artery disease)   Chronic diastolic CHF (congestive heart failure) (HCC)   CKD (chronic kidney disease), stage IIIa (HCC)   Class 1 obesity   Asthma   Bipolar disorder (HCC)   Febrile neutropenia (HCC)   Principal problem Recurrent fevers, sepsis, Staph Epi Bacteremia -Hospital course complicated by intermittent fevers, ID consulted and most recently evaluated patient 6/17.  He had a fever and MRSE bacteremia 1/4 bottles, completed a course of vancomycin  with the end date 6/20.  He did receive a line holiday.  Repeat blood culture 6/9 without growth. - He has unstageable sacral ulcer, continue local wound care.  He remains afebrile and stable  Active problems  Aspergillosis with pneumonia -On Voriconazole  per ID, On Acyclovir .  End date for voriconazole  is 8/5, continue   Multiple myeloma -oncology following. Started on Velcade  and Cytoxan  by Oncology, transitioned to Kyprolis  then Sarclisa .   - Sarclisa  per Oncology - Next dose chemo due ~7/14   Goals of care - Unfortunately poor long-term prognosis. Palliative care has discussed with wife previously.  Remain full code on full scope of care.  Awaiting SNF placement but he needs to be able to transfer better and be able to sit up in the  chair for outpatient chemo   Acute Hypoxic Respiratory Failure - Pulmonology following intermittently, trach removed 6/11.  Respiratory status is stable, on minimal oxygen today, continue supportive care   Right Thigh  Hematoma - per CT on 08/01/23: 12.1 x 30.2 x 7.6 cm subcutaneous hematoma within the anterior right thigh. 8.4 x 10.4 x 22.2 cm subcutaneous hematoma within the right gluteal region - continue supportive care.  Hemoglobin stable   Severe acute metabolic encephalopathy with hypoactive delirium, seizure disorder - Normal MRI of the brain.  LP negative for meningitis or encephalitis.  EEG negative for seizures but encephalopathy. Home Depakote  discontinued in ICU.   Insulin -dependent diabetes type 2 with hyperglycemia- A1c 6.0, continue with sliding scale insulin   CBG (last 3)  Recent Labs    09/17/23 1623 09/17/23 2127 09/18/23 0630  GLUCAP 113* 141* 89    AKI on CKD stage IIIa, Uremia,  Hyponatremia, Hypokalemia -Due to MM, hypercalcemia and ACE inhibitors?  Nephrology consulted, was on hemodialysis but eventually he tolerated being off HD for the past week, tunneled catheter discontinued per nephrology.  Renal function has recovered   Paroxysmal A-fib - Unable to  anticoagulation due to hematoma. Rate controlled.    Obstructive Sleep Apnea -Trach removed   Anxiety/Bipolar disorder - Continue with Paxil    Severe Malnutrition - Continue tube feeding.  He is allowed clear liquid per speech   History of Pathological fracture and multiple ORIF left Humerus 01/2023 -Dr. Jonel discussed with orthopedics on 6/26, options would involve either a substantial reconstruction with antibiotic cement and prolonged, difficult recovery, or above elbow amputation.  [name] recommends more rehabilitation at this time prior to attempting surgery. - Maintain left arm splint when out of bed - Follow-up with [name] after discharge   Hypokalemia -potassium stable this morning   Hyponatremia -sodium overall stable    Scheduled Meds:  acyclovir   400 mg Oral BID   feeding supplement (NEPRO CARB STEADY)  237 mL Per Tube TID PC & HS   feeding supplement (PROSource TF20)  60 mL Per Tube BID   heparin   injection (subcutaneous)  5,000 Units Subcutaneous Q8H   heparin  sodium (porcine)  10,000 Units Intravenous Once   insulin  aspart  0-15 Units Subcutaneous TID AC & HS   insulin  aspart  2 Units Subcutaneous TID AC & HS   insulin  glargine-yfgn  15 Units Subcutaneous Q24H   montelukast   5 mg Oral QHS   multivitamin with minerals  1 tablet Per Tube Daily   nutrition supplement (JUVEN)  1 packet Oral BID BM   mouth rinse  15 mL Mouth Rinse 4 times per day   PARoxetine   40 mg Oral Daily   polyethylene glycol  17 g Oral BID   senna  1 tablet Per Tube Daily   sodium chloride  flush  10-40 mL Intracatheter Q12H   sodium hypochlorite   Irrigation Daily   umeclidinium-vilanterol  1 puff Inhalation Daily   valproic  acid  750 mg Per Tube BID   voriconazole   350 mg Oral Q12H   Continuous Infusions:  sodium chloride  Stopped (08/28/23 1208)   sodium chloride  Stopped (08/28/23 0839)   PRN Meds:.acetaminophen  (TYLENOL ) oral liquid 160 mg/5 mL **OR** acetaminophen , hydrALAZINE , hydrOXYzine , ipratropium-albuterol , ondansetron  (ZOFRAN ) IV, oxyCODONE , artificial tears, sodium chloride  flush  Current Outpatient Medications  Medication Instructions   albuterol  (PROVENTIL  HFA;VENTOLIN  HFA) 108 (90 Base) MCG/ACT inhaler 2 puffs, Inhalation, Every 6 hours PRN   albuterol  (PROVENTIL ) 2.5 mg, Every 2 hours PRN  aspirin  EC 81 mg, Oral, Daily, Swallow whole.   buPROPion (WELLBUTRIN XL) 150 mg, Daily   busPIRone  (BUSPAR ) 15 mg, Oral, Daily at bedtime   celecoxib  (CELEBREX ) 200 mg, Oral, Daily   clonazePAM  (KLONOPIN ) 1 mg, Oral, Once PRN   cyclobenzaprine  (FLEXERIL ) 5 mg, Oral, 3 times daily PRN   divalproex  (DEPAKOTE ) 500 mg, Oral, 2 times daily   EPINEPHrine  (EPI-PEN) 0.3 mg, Intramuscular, As needed   esomeprazole (NEXIUM) 40 mg, Oral, Every morning   Fluticasone -Salmeterol (ADVAIR) 500-50 MCG/DOSE AEPB 2 puffs, Inhalation, Every morning   furosemide  (LASIX ) 40 mg, Oral, Daily PRN   gabapentin  (NEURONTIN )  300-600 mg, Oral, See admin instructions, 300 mg in the morning, 600 mg at bedtime    hydrALAZINE  (APRESOLINE ) 10 mg, Oral, 2 times daily   hydrochlorothiazide  (HYDRODIURIL ) 25 mg, Oral, Daily   lisinopril  (ZESTRIL ) 40 mg, Oral, Daily   metFORMIN  (GLUCOPHAGE ) 1,000 mg, Oral, 2 times daily with meals   montelukast  (SINGULAIR ) 10 mg, Oral, Daily at bedtime   OVER THE COUNTER MEDICATION 400 mg, Oral, Daily at bedtime, Burdock root    oxyCODONE  (ROXICODONE ) 5 mg, Oral, Every 4 hours PRN   PARoxetine  (PAXIL ) 40 mg, Oral, BH-each morning   spironolactone  (ALDACTONE ) 25 mg, Oral, Daily at bedtime   tamsulosin  (FLOMAX ) 0.4 mg, Oral, Daily at bedtime   testosterone  cypionate (DEPOTESTOSTERONE CYPIONATE) 100 mg, Intramuscular, Every 14 days   verapamil  (CALAN -SR) 120 mg, Oral, Daily   Vitamin D  (Ergocalciferol ) (DRISDOL ) 50,000 Units, Oral, Every Thu    Diet Orders (From admission, onward)     Start     Ordered   09/17/23 1454  DIET - DYS 1 Room service appropriate? No; Fluid consistency: Thin  Diet effective now       Comments: No dairy, clear liquids only  Question Answer Comment  Room service appropriate? No   Fluid consistency: Thin      09/17/23 1453            DVT prophylaxis: heparin  injection 5,000 Units Start: 08/14/23 2200 Place and maintain sequential compression device Start: 07/14/23 1033   Lab Results  Component Value Date   PLT 209 09/17/2023      Code Status: Full Code  Family Communication: no family at bedside   Status is: Inpatient Remains inpatient appropriate because: severity of illness  Level of care: Med-Surg  Consultants:  ID Nephrology Oncology  Objective: Vitals:   09/17/23 1934 09/17/23 2352 09/18/23 0308 09/18/23 0722  BP: (!) 122/92 (!) 141/88 125/62 (!) 125/92  Pulse: 89 84 89 76  Resp: 18 16 16 18   Temp: 98.3 F (36.8 C) 97.8 F (36.6 C) 98 F (36.7 C) 98.3 F (36.8 C)  TempSrc: Oral Oral Oral Oral  SpO2: 95% 98% 95% 96%   Weight:      Height:        Intake/Output Summary (Last 24 hours) at 09/18/2023 1010 Last data filed at 09/18/2023 0620 Gross per 24 hour  Intake 240 ml  Output 3200 ml  Net -2960 ml   Wt Readings from Last 3 Encounters:  09/15/23 103 kg  06/05/23 113.7 kg  01/29/23 117.9 kg    Examination:  Constitutional: NAD Eyes: lids and conjunctivae normal, no scleral icterus ENMT: mmm Neck: normal, supple Respiratory: clear to auscultation bilaterally, no wheezing, no crackles. Normal respiratory effort.  Cardiovascular: Regular rate and rhythm, no murmurs / rubs / gallops. No LE edema. Abdomen: soft, no distention, no tenderness. Bowel sounds positive.    Data  Reviewed: I have independently reviewed following labs and imaging studies   CBC Recent Labs  Lab 09/12/23 0921 09/13/23 1742 09/14/23 0721 09/15/23 0504 09/16/23 1224 09/17/23 0837  WBC 5.8 5.1 6.2 5.5 5.6 3.9*  HGB 9.4* 8.8* 8.5* 8.9* 8.8* 8.4*  HCT 28.2* 27.2* 26.3* 27.6* 26.5* 25.1*  PLT 216 221 216 211 219 209  MCV 107.6* 109.2* 110.5* 110.4* 109.1* 108.2*  MCH 35.9* 35.3* 35.7* 35.6* 36.2* 36.2*  MCHC 33.3 32.4 32.3 32.2 33.2 33.5  RDW 25.0* 24.6* 25.0* 24.6* 24.0* 23.7*  LYMPHSABS 0.8 1.1 0.4* 0.7 0.8  --   MONOABS 0.8 0.6 0.4 0.6 0.6  --   EOSABS 0.0 0.0 0.0 0.0 0.0  --   BASOSABS 0.0 0.0 0.0 0.0 0.0  --     Recent Labs  Lab 09/12/23 0921 09/12/23 0922 09/13/23 1742 09/14/23 0721 09/15/23 0504 09/16/23 1224 09/17/23 0837  NA 130*   < > 130* 130* 129* 132* 129*  K 3.9   < > 4.1 4.4 4.8 4.5 4.4  CL 97*   < > 95* 96* 96* 99 96*  CO2 24   < > 24 21* 25 24 23   GLUCOSE 101*   < > 197* 102* 123* 116* 115*  BUN 60*   < > 68* 62* 61* 60* 64*  CREATININE 1.06   < > 1.07 1.09 1.06 0.99 0.97  CALCIUM  8.7*   < > 8.5* 8.0* 8.2* 8.6* 8.3*  AST 19  --   --   --   --   --   --   ALT 12  --   --   --   --   --   --   ALKPHOS 113  --   --   --   --   --   --   BILITOT 0.3  --   --   --   --   --   --    ALBUMIN  2.0*   < > 1.9* 1.9* 1.9* 2.0* 2.0*   < > = values in this interval not displayed.    ------------------------------------------------------------------------------------------------------------------ No results for input(s): CHOL, HDL, LDLCALC, TRIG, CHOLHDL, LDLDIRECT in the last 72 hours.  Lab Results  Component Value Date   HGBA1C 6.0 (H) 08/14/2023   ------------------------------------------------------------------------------------------------------------------ No results for input(s): TSH, T4TOTAL, T3FREE, THYROIDAB in the last 72 hours.  Invalid input(s): FREET3  Cardiac Enzymes No results for input(s): CKMB, TROPONINI, MYOGLOBIN in the last 168 hours.  Invalid input(s): CK ------------------------------------------------------------------------------------------------------------------    Component Value Date/Time   BNP 301.2 (H) 07/23/2023 0816    CBG: Recent Labs  Lab 09/17/23 0620 09/17/23 1107 09/17/23 1623 09/17/23 2127 09/18/23 0630  GLUCAP 96 121* 113* 141* 89    No results found for this or any previous visit (from the past 240 hours).    Radiology Studies: No results found.   Nilda Fendt, MD, PhD [facility]  Between 7 am - 7 pm I am available, please contact me via Amion (for emergencies) or Securechat (non urgent messages)  Between 7 pm - 7 am I am not available, please contact night coverage MD/APP via Amion

## 2023-09-18 NOTE — TOC Progression Note (Signed)
 Transition of Care (TOC) - Progression Note    Patient Details  Name: Frank [name]. MRN: [id number] Date of Birth: 1965/07/21  Transition of Care [facility]) CM/SW Contact  Almarie CHRISTELLA Goodie, [place] Phone Number: 09/18/2023, 12:53 PM  Clinical Narrative:   CSW following for disposition. Patient still not able to tolerate outpatient chemo sessions at this time, refusing to get into a chair. CSW to continue to work on SNF once patient is appropriate.     Expected Discharge Plan: Skilled Nursing Facility Barriers to Discharge: Continued Medical Work up  Expected Discharge Plan and Services In-house Referral: Clinical Social Work Discharge Planning Services: CM Consult Post Acute Care Choice: Skilled Nursing Facility Living arrangements for the past 2 months: Single Family Home                 DME Arranged: N/A DME Agency: NA       HH Arranged: NA HH Agency: NA         Social Determinants of Health (SDOH) Interventions SDOH Screenings   Food Insecurity: No Food Insecurity (07/05/2023)  Housing: Low Risk  (07/05/2023)  Transportation Needs: No Transportation Needs (07/05/2023)  Utilities: Not At Risk (07/05/2023)  Tobacco Use: High Risk (08/30/2023)    Readmission Risk Interventions     No data to display

## 2023-09-18 NOTE — Progress Notes (Signed)
 Every time that I see [name], he seems to be doing better.  We had a nice talk this morning.  I think the real issue is trying to see about rehab for him.  I think the real hindrance for the rehab is that he cannot use his left arm.  Again, I have yet to see orthopedic surgery see him to try to help with his with getting that left arm to function better.  He is eating I think a soft diet.  Hopefully, he will be able to have a regular diet at some point and have that G-tube taken out.  He is urinating.  He has had no dialysis.  His last myeloma studies showed his monoclonal spike to be 0.8 g/dL.  IgG level is down to 1470 mg/dL.  The Kappa light chain was 2.3 mg/dL.  Again this new protocol is worked very well.  I really think that we can give him a couple weeks off.  I think that we can start his second full cycle on 10/02/2023.  He is having some pain with the left arm.  He is not having diarrhea.  His labs yesterday showed a white cell count 3.9.  Hemoglobin 8.4.  Platelet count 209,000. His BUN is 64 creatinine 0.97.  Calcium  8.3 with an albumin  of 2.0.  Again, his myeloma has responded very nicely to the new protocol.  I am very happy about this.  I suppose that we might want to think about getting a Port-A-Cath put in him for chemotherapy down the road.  This may not be a bad idea.  Again, it is going to be about his rehab.  Again I am not sure how much he will be able to do.  From my perspective, I see no restrictions outside of his left arm.  His immune system actually is better.  Hopefully, once he can have regular food, he will be able to make further strides in strength and stamina.  He really has been in the hospital now for over 3 months.  This has been an incredible journey for him.  I am so happy that he is done so well now and that he is really talking to me and is nice to have a conversation with him.  Again he served our country.  He has a true American  hero.   Jeralyn Crease, MD   [place] 14:14

## 2023-09-19 DIAGNOSIS — R6521 Severe sepsis with septic shock: Secondary | ICD-10-CM | POA: Diagnosis not present

## 2023-09-19 DIAGNOSIS — A419 Sepsis, unspecified organism: Secondary | ICD-10-CM | POA: Diagnosis not present

## 2023-09-19 LAB — CBC
HCT: 26.4 % — ABNORMAL LOW (ref 39.0–52.0)
Hemoglobin: 8.6 g/dL — ABNORMAL LOW (ref 13.0–17.0)
MCH: 35.1 pg — ABNORMAL HIGH (ref 26.0–34.0)
MCHC: 32.6 g/dL (ref 30.0–36.0)
MCV: 107.8 fL — ABNORMAL HIGH (ref 80.0–100.0)
Platelets: 222 K/uL (ref 150–400)
RBC: 2.45 MIL/uL — ABNORMAL LOW (ref 4.22–5.81)
RDW: 23.3 % — ABNORMAL HIGH (ref 11.5–15.5)
WBC: 5.2 K/uL (ref 4.0–10.5)
nRBC: 0 % (ref 0.0–0.2)

## 2023-09-19 LAB — RENAL FUNCTION PANEL
Albumin: 2.1 g/dL — ABNORMAL LOW (ref 3.5–5.0)
Anion gap: 9 (ref 5–15)
BUN: 55 mg/dL — ABNORMAL HIGH (ref 6–20)
CO2: 23 mmol/L (ref 22–32)
Calcium: 7.9 mg/dL — ABNORMAL LOW (ref 8.9–10.3)
Chloride: 101 mmol/L (ref 98–111)
Creatinine, Ser: 1.08 mg/dL (ref 0.61–1.24)
GFR, Estimated: >60 mL/min (ref >60)
Glucose, Bld: 134 mg/dL — ABNORMAL HIGH (ref 70–99)
Phosphorus: 4.3 mg/dL (ref 2.5–4.6)
Potassium: 4.1 mmol/L (ref 3.5–5.1)
Sodium: 133 mmol/L — ABNORMAL LOW (ref 135–145)

## 2023-09-19 LAB — COMPREHENSIVE METABOLIC PANEL WITH GFR
ALT: 10 U/L (ref 0–44)
AST: 15 U/L (ref 15–41)
Albumin: 2.1 g/dL — ABNORMAL LOW (ref 3.5–5.0)
Alkaline Phosphatase: 106 U/L (ref 38–126)
Anion gap: 10 (ref 5–15)
BUN: 54 mg/dL — ABNORMAL HIGH (ref 6–20)
CO2: 23 mmol/L (ref 22–32)
Calcium: 8.1 mg/dL — ABNORMAL LOW (ref 8.9–10.3)
Chloride: 101 mmol/L (ref 98–111)
Creatinine, Ser: 1 mg/dL (ref 0.61–1.24)
GFR, Estimated: >60 mL/min (ref >60)
Glucose, Bld: 135 mg/dL — ABNORMAL HIGH (ref 70–99)
Potassium: 4.1 mmol/L (ref 3.5–5.1)
Sodium: 134 mmol/L — ABNORMAL LOW (ref 135–145)
Total Bilirubin: 0.5 mg/dL (ref 0.0–1.2)
Total Protein: 5.2 g/dL — ABNORMAL LOW (ref 6.5–8.1)

## 2023-09-19 LAB — GLUCOSE, CAPILLARY
Glucose-Capillary: 88 mg/dL (ref 70–99)
Glucose-Capillary: 83 mg/dL (ref 70–99)
Glucose-Capillary: 134 mg/dL — ABNORMAL HIGH (ref 70–99)
Glucose-Capillary: 66 mg/dL — ABNORMAL LOW (ref 70–99)
Glucose-Capillary: 89 mg/dL (ref 70–99)
Glucose-Capillary: 324 mg/dL — ABNORMAL HIGH (ref 70–99)
Glucose-Capillary: 135 mg/dL — ABNORMAL HIGH (ref 70–99)

## 2023-09-19 LAB — MAGNESIUM: Magnesium: 2.2 mg/dL (ref 1.7–2.4)

## 2023-09-19 NOTE — Progress Notes (Signed)
 Physical Therapy Wound Treatment Patient Details  Name: Frank [name]. MRN: [id number] Date of Birth: [birth year]  Today's Date: 09/19/2023 Time: 8895-8856 Time Calculation (min): 39 min  Subjective  Subjective Assessment Patient and Family Stated Goals: to be able to see his grandson Date of Onset:  (exact date unknown) Prior Treatments: Sacral foam  Pain Score:  7/10  Wound Assessment  Wound Image   09/17/23 1300  Dressing Type Foam - Lift dressing to assess site every shift;Gauze (Comment);ABD;Dakin's-soaked gauze 09/19/23 1300  Dressing Changed 09/19/23 1300  Dressing Change Frequency Daily 09/19/23 1300  State of Healing Early/partial granulation 09/19/23 1300  Site / Wound Assessment Clean;Yellow;Pink;Pale;Granulation tissue 09/19/23 1300  % Wound base Red or Granulating 15% 09/19/23 1300  % Wound base Yellow/Fibrinous Exudate 85% 09/19/23 1300  % Wound base Black/Eschar 0% 09/17/23 1300  % Wound base Other/Granulation Tissue (Comment) 0% 09/17/23 1300  Peri-wound Assessment Erythema (blanchable);Maceration 09/19/23 1300  Wound Length (cm) 4.5 cm 09/17/23 1300  Wound Width (cm) 3 cm 09/17/23 1300  Wound Depth (cm) 0 cm 09/10/23 1617  Wound Surface Area (cm^2) 13.5 cm^2 09/17/23 1300  Wound Volume (cm^3) 0 cm^3 09/10/23 1617  Margins Unattached edges (unapproximated) 09/19/23 1300  Drainage Amount Minimal 09/19/23 1300  Drainage Description No odor 09/19/23 1300  Treatment Cleansed;Debridement (Selective);Irrigation 09/19/23 1300           Wound Assessment and Plan  Wound Therapy - Assess/Plan/Recommendations Wound Therapy - Clinical Statement: Pt premedicated with pain and anxiety medication, increased education on sequence of event and need for dressing to be placed by end of session and that rolling over without dressing being placed would necessitate turning twice. Pt voices understanding. PT provided step by step commentary on what was being done. Pt tolerated  session much better today. Continues to have tightly adhered slough on wound bed. Wound Therapy - Functional Problem List: decreased mobility, cancer and chemo Factors Delaying/Impairing Wound Healing: Diabetes Mellitus, Multiple medical problems, Immobility, Polypharmacy Hydrotherapy Plan: Debridement, Dressing change, Patient/family education Wound Therapy - Frequency: 2X / week Wound Therapy - Current Recommendations: OT, PT, Case manager/social work Wound Therapy - Follow Up Recommendations: dressing changes by RN  Wound Therapy Goals- Improve the function of patient's integumentary system by progressing the wound(s) through the phases of wound healing (inflammation - proliferation - remodeling) by: Wound Therapy Goals - Improve the function of patient's integumentary system by progressing the wound(s) through the phases of wound healing by: Decrease Necrotic Tissue to: 50 Decrease Necrotic Tissue - Progress: Progressing toward goal Increase Granulation Tissue to: 30 Increase Granulation Tissue - Progress: Progressing toward goal Decrease Length/Width/Depth by (cm): L and W by 0.5 cm Decrease Length/Width/Depth - Progress: Progressing toward goal Goals/treatment plan/discharge plan were made with and agreed upon by patient/family: Yes Time For Goal Achievement: 7 days Wound Therapy - Potential for Goals: Fair  Goals will be updated until maximal potential achieved or discharge criteria met.  Discharge criteria: when goals achieved, discharge from hospital, MD decision/surgical intervention, no progress towards goals, refusal/missing three consecutive treatments without notification or medical reason.  GP     Charges PT Wound Care Charges $Wound Debridement up to 20 cm: < or equal to 20 cm $PT Hydrotherapy Dressing: 2 dressings $PT Hydrotherapy Visit: 1 Visit     [employer] Veasey B. Fleeta Lapidus PT, DPT Acute Rehabilitation Services Please use secure chat or  Call Office [phone]   [name] 09/19/2023, 1:30 PM

## 2023-09-19 NOTE — Progress Notes (Signed)
 PROGRESS NOTE  Frank [name]. FMW:[id number] DOB: [birth year] DOA: 07/03/2023 PCP: [name], MD   LOS: 78 days   Brief Narrative / Interim history: 58 year old PMH OSA CPAP, pathological fracture and multiple ORIF left Humerus likely due to multiple myeloma, A-fib not on anticoagulation, hyperlipidemia, tobacco use disorder who was  feeling weak for 1 week, admitted  to [facility] health with concern of sepsis, community-acquired pneumonia and acute encephalopathy.  Patient was found to have lytic lesions,  he was transferred to [facility] for  oncology treatment.  In the meantime he developed encephalopathy, acute renal failure.  He remained in the ICU for 36 days.  Underwent tracheostomy. He was also started on HD after  CRRT.  Subsequently PEG tube placed 5/29.   Significant events 4/17-Admitted to medical floor with oncology consultation to treat multiple myeloma. 4/19-transferred to ICU intubated, encephalopathic. 4/20-A-fib with RVR. Started heparin . Groin and biopsy site hematoma requiring multiple transfusions. EEG 4/25 >> moderate to severe encephalopathy without any seizures or epileptiform discharges. 4/29 Received Velcade  and Cytoxan  chemotherapy-intent palliative? 5/2 LP and ETT exchange  5/8 Trach placed. Some post op bleeding improved with thrombi-pad 5/10 Voriconazole  added back based on CT Chest findings 5/13 more alert and able to follow some commands. 5/23, transferred to floor. 5/29-PEG placed 5/31 chemotherapy changed to Sarclisa /Kyprolis /Cytoxan . 6/13 plan to remove HD cathter for holiday line 6/16 new HD catheter placed 6/23 has not required HD for a week, HD catheter removed 6/28 finished first cycle of chemo, next cycle to start in 2 weeks  Subjective / 24h Interval events: Feels well this morning, denies any pain, no nausea or vomiting  Assesement and Plan: Principal Problem:   Severe sepsis with septic shock (HCC) Active Problems:   Aspergillosis,  unspecified (HCC)   Multiple myeloma not having achieved remission (HCC)   Lobar pneumonia, unspecified organism (HCC)   Closed fracture of left distal humerus   AKI (acute kidney injury) (HCC)   Acute hypoxemic respiratory failure (HCC)   PAF (paroxysmal atrial fibrillation) (HCC)   Acute metabolic encephalopathy   Protein-calorie malnutrition, severe   Hematoma of right thigh   Seizure disorder (HCC)   DMII (diabetes mellitus, type 2) (HCC)   Pressure injury of skin   CAD (coronary artery disease)   Chronic diastolic CHF (congestive heart failure) (HCC)   CKD (chronic kidney disease), stage IIIa (HCC)   Class 1 obesity   Asthma   Bipolar disorder (HCC)   Febrile neutropenia (HCC)   Principal problem Recurrent fevers, sepsis, Staph Epi Bacteremia -Hospital course complicated by intermittent fevers, ID consulted and most recently evaluated patient 6/17.  He had a fever and MRSE bacteremia 1/4 bottles, completed a course of vancomycin  with the end date 6/20.  He did receive a line holiday.  Repeat blood culture 6/9 without growth. - He has unstageable sacral ulcer, continue local wound care.  He is afebrile and stable  Active problems  Aspergillosis with pneumonia -On Voriconazole  per ID, On Acyclovir .  End date for voriconazole  is 8/5, continue   Multiple myeloma -oncology following. Started on Velcade  and Cytoxan  by Oncology, transitioned to Kyprolis  then Sarclisa .   - Sarclisa  per Oncology - Next dose chemo due ~7/14   Goals of care - Unfortunately poor long-term prognosis. Palliative care has discussed with wife previously.  Remain full code on full scope of care.  Awaiting SNF placement but he needs to be able to transfer better and be able to sit up in  the chair for outpatient chemo which he is unable to do currently   Acute Hypoxic Respiratory Failure - Pulmonology following intermittently, trach removed 6/11.  Respiratory status is stable, on minimal oxygen today, continue  supportive care   Right Thigh Hematoma - per CT on 08/01/23: 12.1 x 30.2 x 7.6 cm subcutaneous hematoma within the anterior right thigh. 8.4 x 10.4 x 22.2 cm subcutaneous hematoma within the right gluteal region - continue supportive care.  Hemoglobin stable   Severe acute metabolic encephalopathy with hypoactive delirium, seizure disorder - Normal MRI of the brain.  LP negative for meningitis or encephalitis.  EEG negative for seizures but encephalopathy. Home Depakote  discontinued in ICU.   Insulin -dependent diabetes type 2 with hyperglycemia- A1c 6.0, continue with sliding scale insulin .  CBGs reviewed and are stable  CBG (last 3)  Recent Labs    09/18/23 2321 09/19/23 0433 09/19/23 0821  GLUCAP 192* 88 83    AKI on CKD stage IIIa, Uremia,  Hyponatremia, Hypokalemia -Due to MM, hypercalcemia and ACE inhibitors?  Nephrology consulted, was on hemodialysis but eventually he tolerated being off HD, tunneled catheter discontinued per nephrology.  Renal function has recovered   Paroxysmal A-fib - Unable to  anticoagulation due to hematoma. Rate controlled.    Obstructive Sleep Apnea -Trach removed   Anxiety/Bipolar disorder - Continue with Paxil    Severe Malnutrition - Continue tube feeding.  He is allowed clear liquid per speech   History of Pathological fracture and multiple ORIF left Humerus 01/2023 -Dr. Jonel discussed with orthopedics on 6/26, options would involve either a substantial reconstruction with antibiotic cement and prolonged, difficult recovery, or above elbow amputation.  [name] recommends more rehabilitation at this time prior to attempting surgery. - Maintain left arm splint when out of bed - Follow-up with [name] after discharge   Hypokalemia -K overall stable   Hyponatremia -sodium overall stable    Scheduled Meds:  acyclovir   400 mg Oral BID   enoxaparin  (LOVENOX ) injection  50 mg Subcutaneous Daily   feeding supplement (NEPRO CARB STEADY)  237 mL  Per Tube TID PC & HS   feeding supplement (PROSource TF20)  60 mL Per Tube BID   insulin  aspart  0-15 Units Subcutaneous TID AC & HS   insulin  aspart  2 Units Subcutaneous TID AC & HS   insulin  glargine-yfgn  15 Units Subcutaneous Q24H   montelukast   5 mg Oral QHS   multivitamin with minerals  1 tablet Per Tube Daily   nutrition supplement (JUVEN)  1 packet Oral BID BM   mouth rinse  15 mL Mouth Rinse 4 times per day   PARoxetine   40 mg Oral Daily   polyethylene glycol  17 g Oral BID   senna  1 tablet Per Tube Daily   sodium chloride  flush  10-40 mL Intracatheter Q12H   sodium hypochlorite   Irrigation Daily   umeclidinium-vilanterol  1 puff Inhalation Daily   valproic  acid  750 mg Per Tube BID   voriconazole   350 mg Oral Q12H   Continuous Infusions:  sodium chloride  Stopped (08/28/23 1208)   sodium chloride  Stopped (08/28/23 0839)   PRN Meds:.acetaminophen  (TYLENOL ) oral liquid 160 mg/5 mL **OR** acetaminophen , hydrALAZINE , hydrOXYzine , ipratropium-albuterol , ondansetron  (ZOFRAN ) IV, oxyCODONE , artificial tears, sodium chloride  flush  Current Outpatient Medications  Medication Instructions   albuterol  (PROVENTIL  HFA;VENTOLIN  HFA) 108 (90 Base) MCG/ACT inhaler 2 puffs, Inhalation, Every 6 hours PRN   albuterol  (PROVENTIL ) 2.5 mg, Every 2 hours PRN  aspirin  EC 81 mg, Oral, Daily, Swallow whole.   buPROPion (WELLBUTRIN XL) 150 mg, Daily   busPIRone  (BUSPAR ) 15 mg, Oral, Daily at bedtime   celecoxib  (CELEBREX ) 200 mg, Oral, Daily   clonazePAM  (KLONOPIN ) 1 mg, Oral, Once PRN   cyclobenzaprine  (FLEXERIL ) 5 mg, Oral, 3 times daily PRN   divalproex  (DEPAKOTE ) 500 mg, Oral, 2 times daily   EPINEPHrine  (EPI-PEN) 0.3 mg, Intramuscular, As needed   esomeprazole (NEXIUM) 40 mg, Oral, Every morning   Fluticasone -Salmeterol (ADVAIR) 500-50 MCG/DOSE AEPB 2 puffs, Inhalation, Every morning   furosemide  (LASIX ) 40 mg, Oral, Daily PRN   gabapentin  (NEURONTIN ) 300-600 mg, Oral, See admin  instructions, 300 mg in the morning, 600 mg at bedtime    hydrALAZINE  (APRESOLINE ) 10 mg, Oral, 2 times daily   hydrochlorothiazide  (HYDRODIURIL ) 25 mg, Oral, Daily   lisinopril  (ZESTRIL ) 40 mg, Oral, Daily   metFORMIN  (GLUCOPHAGE ) 1,000 mg, Oral, 2 times daily with meals   montelukast  (SINGULAIR ) 10 mg, Oral, Daily at bedtime   OVER THE COUNTER MEDICATION 400 mg, Oral, Daily at bedtime, Burdock root    oxyCODONE  (ROXICODONE ) 5 mg, Oral, Every 4 hours PRN   PARoxetine  (PAXIL ) 40 mg, Oral, BH-each morning   spironolactone  (ALDACTONE ) 25 mg, Oral, Daily at bedtime   tamsulosin  (FLOMAX ) 0.4 mg, Oral, Daily at bedtime   testosterone  cypionate (DEPOTESTOSTERONE CYPIONATE) 100 mg, Intramuscular, Every 14 days   verapamil  (CALAN -SR) 120 mg, Oral, Daily   Vitamin D  (Ergocalciferol ) (DRISDOL ) 50,000 Units, Oral, Every Thu    Diet Orders (From admission, onward)     Start     Ordered   09/17/23 1454  DIET - DYS 1 Room service appropriate? No; Fluid consistency: Thin  Diet effective now       Comments: No dairy, clear liquids only  Question Answer Comment  Room service appropriate? No   Fluid consistency: Thin      09/17/23 1453            DVT prophylaxis: Place and maintain sequential compression device Start: 07/14/23 1033   Lab Results  Component Value Date   PLT 209 09/17/2023      Code Status: Full Code  Family Communication: no family at bedside   Status is: Inpatient Remains inpatient appropriate because: severity of illness  Level of care: Med-Surg  Consultants:  ID Nephrology Oncology  Objective: Vitals:   09/19/23 0429 09/19/23 0500 09/19/23 0821 09/19/23 0849  BP: (!) 130/90  (!) 134/92   Pulse: 86  90   Resp: 18  17   Temp: 98.1 F (36.7 C)  98 F (36.7 C)   TempSrc: Axillary  Oral   SpO2: 97%  97% 97%  Weight:  106 kg    Height:        Intake/Output Summary (Last 24 hours) at 09/19/2023 0940 Last data filed at 09/19/2023 0745 Gross per 24 hour   Intake 240 ml  Output 5100 ml  Net -4860 ml   Wt Readings from Last 3 Encounters:  09/19/23 106 kg  06/05/23 113.7 kg  01/29/23 117.9 kg    Examination:  Constitutional: NAD Eyes: lids and conjunctivae normal, no scleral icterus ENMT: mmm Neck: normal, supple Respiratory: clear to auscultation bilaterally, no wheezing, no crackles. Normal respiratory effort.  Cardiovascular: Regular rate and rhythm, no murmurs / rubs / gallops. No LE edema. Abdomen: soft, no distention, no tenderness. Bowel sounds positive.    Data Reviewed: I have independently reviewed following labs and imaging studies   CBC  Recent Labs  Lab 09/13/23 1742 09/14/23 0721 09/15/23 0504 09/16/23 1224 09/17/23 0837  WBC 5.1 6.2 5.5 5.6 3.9*  HGB 8.8* 8.5* 8.9* 8.8* 8.4*  HCT 27.2* 26.3* 27.6* 26.5* 25.1*  PLT 221 216 211 219 209  MCV 109.2* 110.5* 110.4* 109.1* 108.2*  MCH 35.3* 35.7* 35.6* 36.2* 36.2*  MCHC 32.4 32.3 32.2 33.2 33.5  RDW 24.6* 25.0* 24.6* 24.0* 23.7*  LYMPHSABS 1.1 0.4* 0.7 0.8  --   MONOABS 0.6 0.4 0.6 0.6  --   EOSABS 0.0 0.0 0.0 0.0  --   BASOSABS 0.0 0.0 0.0 0.0  --     Recent Labs  Lab 09/14/23 0721 09/15/23 0504 09/16/23 1224 09/17/23 0837 09/18/23 1100  NA 130* 129* 132* 129* 131*  K 4.4 4.8 4.5 4.4 4.4  CL 96* 96* 99 96* 99  CO2 21* 25 24 23 24   GLUCOSE 102* 123* 116* 115* 91  BUN 62* 61* 60* 64* 59*  CREATININE 1.09 1.06 0.99 0.97 0.98  CALCIUM  8.0* 8.2* 8.6* 8.3* 8.1*  ALBUMIN  1.9* 1.9* 2.0* 2.0* 2.0*    ------------------------------------------------------------------------------------------------------------------ No results for input(s): CHOL, HDL, LDLCALC, TRIG, CHOLHDL, LDLDIRECT in the last 72 hours.  Lab Results  Component Value Date   HGBA1C 6.0 (H) 08/14/2023   ------------------------------------------------------------------------------------------------------------------ No results for input(s): TSH, T4TOTAL, T3FREE,  THYROIDAB in the last 72 hours.  Invalid input(s): FREET3  Cardiac Enzymes No results for input(s): CKMB, TROPONINI, MYOGLOBIN in the last 168 hours.  Invalid input(s): CK ------------------------------------------------------------------------------------------------------------------    Component Value Date/Time   BNP 301.2 (H) 07/23/2023 0816    CBG: Recent Labs  Lab 09/18/23 1611 09/18/23 2119 09/18/23 2321 09/19/23 0433 09/19/23 0821  GLUCAP 109* 152* 192* 88 83    No results found for this or any previous visit (from the past 240 hours).    Radiology Studies: No results found.   Nilda Fendt, MD, PhD [facility]  Between 7 am - 7 pm I am available, please contact me via Amion (for emergencies) or Securechat (non urgent messages)  Between 7 pm - 7 am I am not available, please contact night coverage MD/APP via Amion

## 2023-09-20 DIAGNOSIS — R6521 Severe sepsis with septic shock: Secondary | ICD-10-CM | POA: Diagnosis not present

## 2023-09-20 DIAGNOSIS — A419 Sepsis, unspecified organism: Secondary | ICD-10-CM | POA: Diagnosis not present

## 2023-09-20 LAB — CBC
HCT: 25.2 % — ABNORMAL LOW (ref 39.0–52.0)
Hemoglobin: 8.8 g/dL — ABNORMAL LOW (ref 13.0–17.0)
MCH: 37.3 pg — ABNORMAL HIGH (ref 26.0–34.0)
MCHC: 34.9 g/dL (ref 30.0–36.0)
MCV: 106.8 fL — ABNORMAL HIGH (ref 80.0–100.0)
Platelets: 245 K/uL (ref 150–400)
RBC: 2.36 MIL/uL — ABNORMAL LOW (ref 4.22–5.81)
RDW: 22.9 % — ABNORMAL HIGH (ref 11.5–15.5)
WBC: 3.9 K/uL — ABNORMAL LOW (ref 4.0–10.5)
nRBC: 0 % (ref 0.0–0.2)

## 2023-09-20 LAB — RENAL FUNCTION PANEL
Albumin: 2.1 g/dL — ABNORMAL LOW (ref 3.5–5.0)
Anion gap: 9 (ref 5–15)
BUN: 54 mg/dL — ABNORMAL HIGH (ref 6–20)
CO2: 22 mmol/L (ref 22–32)
Calcium: 8.2 mg/dL — ABNORMAL LOW (ref 8.9–10.3)
Chloride: 99 mmol/L (ref 98–111)
Creatinine, Ser: 0.95 mg/dL (ref 0.61–1.24)
GFR, Estimated: >60 mL/min (ref >60)
Glucose, Bld: 87 mg/dL (ref 70–99)
Phosphorus: 4 mg/dL (ref 2.5–4.6)
Potassium: 4.4 mmol/L (ref 3.5–5.1)
Sodium: 130 mmol/L — ABNORMAL LOW (ref 135–145)

## 2023-09-20 LAB — GLUCOSE, CAPILLARY
Glucose-Capillary: 66 mg/dL — ABNORMAL LOW (ref 70–99)
Glucose-Capillary: 81 mg/dL (ref 70–99)
Glucose-Capillary: 103 mg/dL — ABNORMAL HIGH (ref 70–99)
Glucose-Capillary: 112 mg/dL — ABNORMAL HIGH (ref 70–99)
Glucose-Capillary: 87 mg/dL (ref 70–99)

## 2023-09-20 LAB — MAGNESIUM: Magnesium: 2.2 mg/dL (ref 1.7–2.4)

## 2023-09-20 MED ORDER — MELATONIN 3 MG PO TABS
3.0000 mg | ORAL_TABLET | Freq: Once | ORAL | Status: AC
  Administered 2023-09-21: 3 mg via ORAL
  Filled 2023-09-20: qty 1

## 2023-09-20 MED ORDER — VALPROIC ACID 250 MG/5ML PO SOLN
750.0000 mg | Freq: Two times a day (BID) | ORAL | Status: DC
  Administered 2023-09-20 – 2023-11-07 (×102): 750 mg via ORAL
  Filled 2023-09-20 (×101): qty 15

## 2023-09-20 MED ORDER — SENNA 8.6 MG PO TABS
1.0000 | ORAL_TABLET | Freq: Every day | ORAL | Status: DC
  Administered 2023-09-21 – 2023-11-07 (×37): 8.6 mg via ORAL
  Filled 2023-09-20 (×43): qty 1

## 2023-09-20 MED ORDER — ADULT MULTIVITAMIN W/MINERALS CH
1.0000 | ORAL_TABLET | Freq: Every day | ORAL | Status: DC
  Administered 2023-09-20 – 2023-11-07 (×51): 1 via ORAL
  Filled 2023-09-20 (×48): qty 1

## 2023-09-20 MED ORDER — INSULIN GLARGINE-YFGN 100 UNIT/ML ~~LOC~~ SOLN
10.0000 [IU] | SUBCUTANEOUS | Status: DC
  Administered 2023-09-20 – 2023-10-17 (×24): 10 [IU] via SUBCUTANEOUS
  Filled 2023-09-20 (×29): qty 0.1

## 2023-09-20 NOTE — Progress Notes (Signed)
 PROGRESS NOTE  Frank [name]. FMW:[id number] DOB: 1965/05/03 DOA: 07/03/2023 PCP: [name], MD   LOS: 79 days   Brief Narrative / Interim history: 58 year old PMH OSA CPAP, pathological fracture and multiple ORIF left Humerus likely due to multiple myeloma, A-fib not on anticoagulation, hyperlipidemia, tobacco use disorder who was  feeling weak for 1 week, admitted  to [facility] health with concern of sepsis, community-acquired pneumonia and acute encephalopathy.  Patient was found to have lytic lesions,  he was transferred to [facility] for  oncology treatment.  In the meantime he developed encephalopathy, acute renal failure.  He remained in the ICU for 36 days.  Underwent tracheostomy. He was also started on HD after  CRRT.  Subsequently PEG tube placed 5/29.   Significant events 4/17-Admitted to medical floor with oncology consultation to treat multiple myeloma. 4/19-transferred to ICU intubated, encephalopathic. 4/20-A-fib with RVR. Started heparin . Groin and biopsy site hematoma requiring multiple transfusions. EEG 4/25 >> moderate to severe encephalopathy without any seizures or epileptiform discharges. 4/29 Received Velcade  and Cytoxan  chemotherapy-intent palliative? 5/2 LP and ETT exchange  5/8 Trach placed. Some post op bleeding improved with thrombi-pad 5/10 Voriconazole  added back based on CT Chest findings 5/13 more alert and able to follow some commands. 5/23, transferred to floor. 5/29-PEG placed 5/31 chemotherapy changed to Sarclisa /Kyprolis /Cytoxan . 6/13 plan to remove HD cathter for holiday line 6/16 new HD catheter placed 6/23 has not required HD for a week, HD catheter removed 6/28 finished first cycle of chemo, next cycle to start in 2 weeks  Subjective / 24h Interval events: Awake, has no complaints.  Overall feeling well.  Assesement and Plan: Principal Problem:   Severe sepsis with septic shock (HCC) Active Problems:   Aspergillosis, unspecified  (HCC)   Multiple myeloma not having achieved remission (HCC)   Lobar pneumonia, unspecified organism (HCC)   Closed fracture of left distal humerus   AKI (acute kidney injury) (HCC)   Acute hypoxemic respiratory failure (HCC)   PAF (paroxysmal atrial fibrillation) (HCC)   Acute metabolic encephalopathy   Protein-calorie malnutrition, severe   Hematoma of right thigh   Seizure disorder (HCC)   DMII (diabetes mellitus, type 2) (HCC)   Pressure injury of skin   CAD (coronary artery disease)   Chronic diastolic CHF (congestive heart failure) (HCC)   CKD (chronic kidney disease), stage IIIa (HCC)   Class 1 obesity   Asthma   Bipolar disorder (HCC)   Febrile neutropenia (HCC)   Principal problem Recurrent fevers, sepsis, Staph Epi Bacteremia -Hospital course complicated by intermittent fevers, ID consulted and most recently evaluated patient 6/17.  He had a fever and MRSE bacteremia 1/4 bottles, completed a course of vancomycin  with the end date 6/20.  He did receive a line holiday.  Repeat blood culture 6/9 without growth. - He has unstageable sacral ulcer, continue local wound care.  Remains stable, afebrile, white count is normal  Active problems  Aspergillosis with pneumonia -On Voriconazole  per ID, On Acyclovir .  End date for voriconazole  is 8/5, continue   Multiple myeloma -oncology following. Started on Velcade  and Cytoxan  by Oncology, transitioned to Kyprolis  then Sarclisa .   - Sarclisa  per Oncology - Next dose chemo due ~7/14   Goals of care - Unfortunately poor long-term prognosis. Palliative care has discussed with wife previously.  Remain full code on full scope of care.  Awaiting SNF placement but he needs to be able to transfer better and be able to sit up in the  chair for outpatient chemo which he is unable to do currently   Acute Hypoxic Respiratory Failure - Pulmonology following intermittently, trach removed 6/11.  Respiratory status is stable, on minimal oxygen today,  continue supportive care   Right Thigh Hematoma - per CT on 08/01/23: 12.1 x 30.2 x 7.6 cm subcutaneous hematoma within the anterior right thigh. 8.4 x 10.4 x 22.2 cm subcutaneous hematoma within the right gluteal region - continue supportive care.  Hemoglobin stable   Severe acute metabolic encephalopathy with hypoactive delirium, seizure disorder - Normal MRI of the brain.  LP negative for meningitis or encephalitis.  EEG negative for seizures but encephalopathy. Home Depakote  discontinued in ICU.   Insulin -dependent diabetes type 2 with hyperglycemia- A1c 6.0, continue with sliding scale insulin .  2 episodes of hypoglycemia in the last 24 hours.  Further decrease Lantus  dose today  CBG (last 3)  Recent Labs    09/19/23 2202 09/20/23 0620 09/20/23 0645  GLUCAP 135* 66* 81    AKI on CKD stage IIIa, Uremia,  Hyponatremia, Hypokalemia -Due to MM, hypercalcemia and ACE inhibitors?  Nephrology consulted, was on hemodialysis but eventually he tolerated being off HD, tunneled catheter discontinued per nephrology.  Renal function has recovered   Paroxysmal A-fib - Unable to  anticoagulation due to hematoma. Rate controlled.    Obstructive Sleep Apnea -Trach removed   Anxiety/Bipolar disorder - Continue with Paxil    Severe Malnutrition - Continue tube feeding.  He is allowed clear liquid per speech   History of Pathological fracture and multiple ORIF left Humerus 01/2023 -Dr. Jonel discussed with orthopedics on 6/26, options would involve either a substantial reconstruction with antibiotic cement and prolonged, difficult recovery, or above elbow amputation.  [name] recommends more rehabilitation at this time prior to attempting surgery. - Maintain left arm splint when out of bed - Follow-up with [name] after discharge   Hypokalemia -K overall stable   Hyponatremia -sodium overall stable    Scheduled Meds:  acyclovir   400 mg Oral BID   enoxaparin  (LOVENOX ) injection  50 mg  Subcutaneous Daily   feeding supplement (NEPRO CARB STEADY)  237 mL Per Tube TID PC & HS   feeding supplement (PROSource TF20)  60 mL Per Tube BID   insulin  aspart  0-15 Units Subcutaneous TID AC & HS   insulin  aspart  2 Units Subcutaneous TID AC & HS   insulin  glargine-yfgn  10 Units Subcutaneous Q24H   montelukast   5 mg Oral QHS   multivitamin with minerals  1 tablet Oral Daily   nutrition supplement (JUVEN)  1 packet Oral BID BM   mouth rinse  15 mL Mouth Rinse 4 times per day   PARoxetine   40 mg Oral Daily   polyethylene glycol  17 g Oral BID   senna  1 tablet Oral Daily   sodium chloride  flush  10-40 mL Intracatheter Q12H   sodium hypochlorite   Irrigation Daily   umeclidinium-vilanterol  1 puff Inhalation Daily   valproic  acid  750 mg Oral BID   voriconazole   350 mg Oral Q12H   Continuous Infusions:  sodium chloride  Stopped (08/28/23 1208)   sodium chloride  Stopped (08/28/23 0839)   PRN Meds:.acetaminophen  (TYLENOL ) oral liquid 160 mg/5 mL **OR** acetaminophen , hydrALAZINE , hydrOXYzine , ipratropium-albuterol , ondansetron  (ZOFRAN ) IV, oxyCODONE , artificial tears, sodium chloride  flush  Current Outpatient Medications  Medication Instructions   albuterol  (PROVENTIL  HFA;VENTOLIN  HFA) 108 (90 Base) MCG/ACT inhaler 2 puffs, Inhalation, Every 6 hours PRN   albuterol  (PROVENTIL ) 2.5 mg,  Every 2 hours PRN   aspirin  EC 81 mg, Oral, Daily, Swallow whole.   buPROPion (WELLBUTRIN XL) 150 mg, Daily   busPIRone  (BUSPAR ) 15 mg, Oral, Daily at bedtime   celecoxib  (CELEBREX ) 200 mg, Oral, Daily   clonazePAM  (KLONOPIN ) 1 mg, Oral, Once PRN   cyclobenzaprine  (FLEXERIL ) 5 mg, Oral, 3 times daily PRN   divalproex  (DEPAKOTE ) 500 mg, Oral, 2 times daily   EPINEPHrine  (EPI-PEN) 0.3 mg, Intramuscular, As needed   esomeprazole (NEXIUM) 40 mg, Oral, Every morning   Fluticasone -Salmeterol (ADVAIR) 500-50 MCG/DOSE AEPB 2 puffs, Inhalation, Every morning   furosemide  (LASIX ) 40 mg, Oral, Daily PRN    gabapentin  (NEURONTIN ) 300-600 mg, Oral, See admin instructions, 300 mg in the morning, 600 mg at bedtime    hydrALAZINE  (APRESOLINE ) 10 mg, Oral, 2 times daily   hydrochlorothiazide  (HYDRODIURIL ) 25 mg, Oral, Daily   lisinopril  (ZESTRIL ) 40 mg, Oral, Daily   metFORMIN  (GLUCOPHAGE ) 1,000 mg, Oral, 2 times daily with meals   montelukast  (SINGULAIR ) 10 mg, Oral, Daily at bedtime   OVER THE COUNTER MEDICATION 400 mg, Oral, Daily at bedtime, Burdock root    oxyCODONE  (ROXICODONE ) 5 mg, Oral, Every 4 hours PRN   PARoxetine  (PAXIL ) 40 mg, Oral, BH-each morning   spironolactone  (ALDACTONE ) 25 mg, Oral, Daily at bedtime   tamsulosin  (FLOMAX ) 0.4 mg, Oral, Daily at bedtime   testosterone  cypionate (DEPOTESTOSTERONE CYPIONATE) 100 mg, Intramuscular, Every 14 days   verapamil  (CALAN -SR) 120 mg, Oral, Daily   Vitamin D  (Ergocalciferol ) (DRISDOL ) 50,000 Units, Oral, Every Thu    Diet Orders (From admission, onward)     Start     Ordered   09/17/23 1454  DIET - DYS 1 Room service appropriate? No; Fluid consistency: Thin  Diet effective now       Comments: No dairy, clear liquids only  Question Answer Comment  Room service appropriate? No   Fluid consistency: Thin      09/17/23 1453            DVT prophylaxis: Place and maintain sequential compression device Start: 07/14/23 1033   Lab Results  Component Value Date   PLT 245 09/20/2023      Code Status: Full Code  Family Communication: no family at bedside   Status is: Inpatient Remains inpatient appropriate because: severity of illness  Level of care: Med-Surg  Consultants:  ID Nephrology Oncology  Objective: Vitals:   09/19/23 2351 09/20/23 0401 09/20/23 0500 09/20/23 0734  BP: (!) 136/91 (!) 121/94  (!) 134/96  Pulse: 100 91  93  Resp: 18 19  18   Temp: 98.7 F (37.1 C) 98.1 F (36.7 C)  98 F (36.7 C)  TempSrc: Oral Oral  Oral  SpO2: 96% 96%  98%  Weight:   105 kg   Height:        Intake/Output Summary (Last  24 hours) at 09/20/2023 1031 Last data filed at 09/20/2023 0400 Gross per 24 hour  Intake 720 ml  Output 3650 ml  Net -2930 ml   Wt Readings from Last 3 Encounters:  09/20/23 105 kg  06/05/23 113.7 kg  01/29/23 117.9 kg    Examination:  Constitutional: NAD Eyes: lids and conjunctivae normal, no scleral icterus ENMT: mmm Neck: normal, supple Respiratory: clear to auscultation bilaterally, no wheezing, no crackles. Normal respiratory effort.  Cardiovascular: Regular rate and rhythm, no murmurs / rubs / gallops. No LE edema. Abdomen: soft, no distention, no tenderness. Bowel sounds positive.    Data Reviewed: I have  independently reviewed following labs and imaging studies   CBC Recent Labs  Lab 09/13/23 1742 09/14/23 0721 09/15/23 0504 09/16/23 1224 09/17/23 0837 09/19/23 1003 09/20/23 0500  WBC 5.1 6.2 5.5 5.6 3.9* 5.2 3.9*  HGB 8.8* 8.5* 8.9* 8.8* 8.4* 8.6* 8.8*  HCT 27.2* 26.3* 27.6* 26.5* 25.1* 26.4* 25.2*  PLT 221 216 211 219 209 222 245  MCV 109.2* 110.5* 110.4* 109.1* 108.2* 107.8* 106.8*  MCH 35.3* 35.7* 35.6* 36.2* 36.2* 35.1* 37.3*  MCHC 32.4 32.3 32.2 33.2 33.5 32.6 34.9  RDW 24.6* 25.0* 24.6* 24.0* 23.7* 23.3* 22.9*  LYMPHSABS 1.1 0.4* 0.7 0.8  --   --   --   MONOABS 0.6 0.4 0.6 0.6  --   --   --   EOSABS 0.0 0.0 0.0 0.0  --   --   --   BASOSABS 0.0 0.0 0.0 0.0  --   --   --     Recent Labs  Lab 09/16/23 1224 09/17/23 0837 09/18/23 1100 09/19/23 1003 09/20/23 0500  NA 132* 129* 131* 134*  133* 130*  K 4.5 4.4 4.4 4.1  4.1 4.4  CL 99 96* 99 101  101 99  CO2 24 23 24 23  23 22   GLUCOSE 116* 115* 91 135*  134* 87  BUN 60* 64* 59* 54*  55* 54*  CREATININE 0.99 0.97 0.98 1.00  1.08 0.95  CALCIUM  8.6* 8.3* 8.1* 8.1*  7.9* 8.2*  AST  --   --   --  15  --   ALT  --   --   --  10  --   ALKPHOS  --   --   --  106  --   BILITOT  --   --   --  0.5  --   ALBUMIN  2.0* 2.0* 2.0* 2.1*  2.1* 2.1*  MG  --   --   --  2.2 2.2     ------------------------------------------------------------------------------------------------------------------ No results for input(s): CHOL, HDL, LDLCALC, TRIG, CHOLHDL, LDLDIRECT in the last 72 hours.  Lab Results  Component Value Date   HGBA1C 6.0 (H) 08/14/2023   ------------------------------------------------------------------------------------------------------------------ No results for input(s): TSH, T4TOTAL, T3FREE, THYROIDAB in the last 72 hours.  Invalid input(s): FREET3  Cardiac Enzymes No results for input(s): CKMB, TROPONINI, MYOGLOBIN in the last 168 hours.  Invalid input(s): CK ------------------------------------------------------------------------------------------------------------------    Component Value Date/Time   BNP 301.2 (H) 07/23/2023 0816    CBG: Recent Labs  Lab 09/19/23 1530 09/19/23 1917 09/19/23 2202 09/20/23 0620 09/20/23 0645  GLUCAP 89 324* 135* 66* 81    No results found for this or any previous visit (from the past 240 hours).    Radiology Studies: No results found.   Nilda Fendt, MD, PhD [facility]  Between 7 am - 7 pm I am available, please contact me via Amion (for emergencies) or Securechat (non urgent messages)  Between 7 pm - 7 am I am not available, please contact night coverage MD/APP via Amion

## 2023-09-20 NOTE — Progress Notes (Signed)
 Hypoglycemic Event  CBG 66 AT 0620  Treatment: 8 oz juice/soda and Nepro drink  Symptoms: None  Follow-up CBG: Time: 0645 CBG Result: 81  Possible Reasons for Event: Inadequate meal intake  Comments/MD notified:Dr Gherghe seen results with new orders    Obasogie-Asidi, Osmond Steckman Efe

## 2023-09-20 NOTE — Plan of Care (Signed)

## 2023-09-21 DIAGNOSIS — R6521 Severe sepsis with septic shock: Secondary | ICD-10-CM | POA: Diagnosis not present

## 2023-09-21 DIAGNOSIS — A419 Sepsis, unspecified organism: Secondary | ICD-10-CM | POA: Diagnosis not present

## 2023-09-21 LAB — RENAL FUNCTION PANEL
Albumin: 2 g/dL — ABNORMAL LOW (ref 3.5–5.0)
Anion gap: 8 (ref 5–15)
BUN: 56 mg/dL — ABNORMAL HIGH (ref 6–20)
CO2: 22 mmol/L (ref 22–32)
Calcium: 8.4 mg/dL — ABNORMAL LOW (ref 8.9–10.3)
Chloride: 99 mmol/L (ref 98–111)
Creatinine, Ser: 0.95 mg/dL (ref 0.61–1.24)
GFR, Estimated: >60 mL/min (ref >60)
Glucose, Bld: 129 mg/dL — ABNORMAL HIGH (ref 70–99)
Phosphorus: 4.1 mg/dL (ref 2.5–4.6)
Potassium: 4.1 mmol/L (ref 3.5–5.1)
Sodium: 129 mmol/L — ABNORMAL LOW (ref 135–145)

## 2023-09-21 LAB — GLUCOSE, CAPILLARY
Glucose-Capillary: 84 mg/dL (ref 70–99)
Glucose-Capillary: 176 mg/dL — ABNORMAL HIGH (ref 70–99)
Glucose-Capillary: 175 mg/dL — ABNORMAL HIGH (ref 70–99)
Glucose-Capillary: 155 mg/dL — ABNORMAL HIGH (ref 70–99)

## 2023-09-21 NOTE — Progress Notes (Signed)
 PROGRESS NOTE  Frank [name]. FMW:[id number] DOB: [birth year] DOA: 07/03/2023 PCP: [name], MD   LOS: 80 days   Brief Narrative / Interim history: 58 year old PMH OSA CPAP, pathological fracture and multiple ORIF left Humerus likely due to multiple myeloma, A-fib not on anticoagulation, hyperlipidemia, tobacco use disorder who was  feeling weak for 1 week, admitted  to [facility] health with concern of sepsis, community-acquired pneumonia and acute encephalopathy.  Patient was found to have lytic lesions,  he was transferred to [facility] for  oncology treatment.  In the meantime he developed encephalopathy, acute renal failure.  He remained in the ICU for 36 days.  Underwent tracheostomy. He was also started on HD after  CRRT.  Subsequently PEG tube placed 5/29.   Significant events 4/17-Admitted to medical floor with oncology consultation to treat multiple myeloma. 4/19-transferred to ICU intubated, encephalopathic. 4/20-A-fib with RVR. Started heparin . Groin and biopsy site hematoma requiring multiple transfusions. EEG 4/25 >> moderate to severe encephalopathy without any seizures or epileptiform discharges. 4/29 Received Velcade  and Cytoxan  chemotherapy-intent palliative? 5/2 LP and ETT exchange  5/8 Trach placed. Some post op bleeding improved with thrombi-pad 5/10 Voriconazole  added back based on CT Chest findings 5/13 more alert and able to follow some commands. 5/23, transferred to floor. 5/29-PEG placed 5/31 chemotherapy changed to Sarclisa /Kyprolis /Cytoxan . 6/13 plan to remove HD cathter for holiday line 6/16 new HD catheter placed 6/23 has not required HD for a week, HD catheter removed 6/28 finished first cycle of chemo, next cycle to start in 2 weeks  Subjective / 24h Interval events: No complaints, he feels well.  No shortness of breath, no abdominal pain, no nausea or vomiting  Assesement and Plan: Principal Problem:   Severe sepsis with septic shock  (HCC) Active Problems:   Aspergillosis, unspecified (HCC)   Multiple myeloma not having achieved remission (HCC)   Lobar pneumonia, unspecified organism (HCC)   Closed fracture of left distal humerus   AKI (acute kidney injury) (HCC)   Acute hypoxemic respiratory failure (HCC)   PAF (paroxysmal atrial fibrillation) (HCC)   Acute metabolic encephalopathy   Protein-calorie malnutrition, severe   Hematoma of right thigh   Seizure disorder (HCC)   DMII (diabetes mellitus, type 2) (HCC)   Pressure injury of skin   CAD (coronary artery disease)   Chronic diastolic CHF (congestive heart failure) (HCC)   CKD (chronic kidney disease), stage IIIa (HCC)   Class 1 obesity   Asthma   Bipolar disorder (HCC)   Febrile neutropenia (HCC)   Principal problem Recurrent fevers, sepsis, Staph Epi Bacteremia -Hospital course complicated by intermittent fevers, ID consulted and most recently evaluated patient 6/17.  He had a fever and MRSE bacteremia 1/4 bottles, completed a course of vancomycin  with the end date 6/20.  He did receive a line holiday.  Repeat blood culture 6/9 without growth. - He has unstageable sacral ulcer, continue local wound care.  Remains stable, afebrile, white count is normal  Active problems  Aspergillosis with pneumonia -On Voriconazole  per ID, On Acyclovir .  End date for voriconazole  is 8/5, continue   Multiple myeloma -oncology following. Started on Velcade  and Cytoxan  by Oncology, transitioned to Kyprolis  then Sarclisa .   - Sarclisa  per Oncology - Next dose chemo due ~7/14   Goals of care - Unfortunately poor long-term prognosis. Palliative care has discussed with wife previously.  Remain full code on full scope of care.  Awaiting SNF placement but he needs to be able to transfer  better and be able to sit up in the chair for outpatient chemo which he is unable to do currently   Acute Hypoxic Respiratory Failure - Pulmonology following intermittently, trach removed 6/11.   Respiratory status stable   Right Thigh Hematoma - per CT on 08/01/23: 12.1 x 30.2 x 7.6 cm subcutaneous hematoma within the anterior right thigh. 8.4 x 10.4 x 22.2 cm subcutaneous hematoma within the right gluteal region - continue supportive care.  Hemoglobin stable   Severe acute metabolic encephalopathy with hypoactive delirium, seizure disorder - Normal MRI of the brain.  LP negative for meningitis or encephalitis.  EEG negative for seizures but encephalopathy. Home Depakote  discontinued in ICU.   Insulin -dependent diabetes type 2 with hyperglycemia- A1c 6.0, continue with sliding scale insulin .  2 episodes of hypoglycemia in the last 24 hours.  Further decrease Lantus  dose today  CBG (last 3)  Recent Labs    09/20/23 1648 09/20/23 2105 09/21/23 0610  GLUCAP 112* 87 84    AKI on CKD stage IIIa, Uremia,  Hyponatremia, Hypokalemia -Due to MM, hypercalcemia and ACE inhibitors?  Nephrology consulted, was on hemodialysis but eventually he tolerated being off HD, tunneled catheter discontinued per nephrology.  Renal function has recovered   Paroxysmal A-fib - Unable to  anticoagulation due to hematoma. Rate controlled.    Obstructive Sleep Apnea -Trach removed   Anxiety/Bipolar disorder - Continue with Paxil .  Mood appears stable over the last few days   Severe Malnutrition - Continue tube feeding.  He is allowed clear liquid per speech   History of Pathological fracture and multiple ORIF left Humerus 01/2023 -Dr. Jonel discussed with orthopedics on 6/26, options would involve either a substantial reconstruction with antibiotic cement and prolonged, difficult recovery, or above elbow amputation.  [name] recommends more rehabilitation at this time prior to attempting surgery. - Maintain left arm splint when out of bed - Follow-up with [name] after discharge   Hypokalemia -K overall stable   Hyponatremia -sodium overall stable, 129 today    Scheduled Meds:  acyclovir   400  mg Oral BID   enoxaparin  (LOVENOX ) injection  50 mg Subcutaneous Daily   feeding supplement (NEPRO CARB STEADY)  237 mL Per Tube TID PC & HS   feeding supplement (PROSource TF20)  60 mL Per Tube BID   insulin  aspart  0-15 Units Subcutaneous TID AC & HS   insulin  aspart  2 Units Subcutaneous TID AC & HS   insulin  glargine-yfgn  10 Units Subcutaneous Q24H   montelukast   5 mg Oral QHS   multivitamin with minerals  1 tablet Oral Daily   nutrition supplement (JUVEN)  1 packet Oral BID BM   mouth rinse  15 mL Mouth Rinse 4 times per day   PARoxetine   40 mg Oral Daily   polyethylene glycol  17 g Oral BID   senna  1 tablet Oral Daily   sodium chloride  flush  10-40 mL Intracatheter Q12H   sodium hypochlorite   Irrigation Daily   umeclidinium-vilanterol  1 puff Inhalation Daily   valproic  acid  750 mg Oral BID   voriconazole   350 mg Oral Q12H   Continuous Infusions:  sodium chloride  Stopped (08/28/23 1208)   sodium chloride  Stopped (08/28/23 0839)   PRN Meds:.acetaminophen  (TYLENOL ) oral liquid 160 mg/5 mL **OR** acetaminophen , hydrALAZINE , hydrOXYzine , ipratropium-albuterol , ondansetron  (ZOFRAN ) IV, oxyCODONE , artificial tears, sodium chloride  flush  Current Outpatient Medications  Medication Instructions   albuterol  (PROVENTIL  HFA;VENTOLIN  HFA) 108 (90 Base) MCG/ACT inhaler 2  puffs, Inhalation, Every 6 hours PRN   albuterol  (PROVENTIL ) 2.5 mg, Every 2 hours PRN   aspirin  EC 81 mg, Oral, Daily, Swallow whole.   buPROPion (WELLBUTRIN XL) 150 mg, Daily   busPIRone  (BUSPAR ) 15 mg, Oral, Daily at bedtime   celecoxib  (CELEBREX ) 200 mg, Oral, Daily   clonazePAM  (KLONOPIN ) 1 mg, Oral, Once PRN   cyclobenzaprine  (FLEXERIL ) 5 mg, Oral, 3 times daily PRN   divalproex  (DEPAKOTE ) 500 mg, Oral, 2 times daily   EPINEPHrine  (EPI-PEN) 0.3 mg, Intramuscular, As needed   esomeprazole (NEXIUM) 40 mg, Oral, Every morning   Fluticasone -Salmeterol (ADVAIR) 500-50 MCG/DOSE AEPB 2 puffs, Inhalation, Every  morning   furosemide  (LASIX ) 40 mg, Oral, Daily PRN   gabapentin  (NEURONTIN ) 300-600 mg, Oral, See admin instructions, 300 mg in the morning, 600 mg at bedtime    hydrALAZINE  (APRESOLINE ) 10 mg, Oral, 2 times daily   hydrochlorothiazide  (HYDRODIURIL ) 25 mg, Oral, Daily   lisinopril  (ZESTRIL ) 40 mg, Oral, Daily   metFORMIN  (GLUCOPHAGE ) 1,000 mg, Oral, 2 times daily with meals   montelukast  (SINGULAIR ) 10 mg, Oral, Daily at bedtime   OVER THE COUNTER MEDICATION 400 mg, Oral, Daily at bedtime, Burdock root    oxyCODONE  (ROXICODONE ) 5 mg, Oral, Every 4 hours PRN   PARoxetine  (PAXIL ) 40 mg, Oral, BH-each morning   spironolactone  (ALDACTONE ) 25 mg, Oral, Daily at bedtime   tamsulosin  (FLOMAX ) 0.4 mg, Oral, Daily at bedtime   testosterone  cypionate (DEPOTESTOSTERONE CYPIONATE) 100 mg, Intramuscular, Every 14 days   verapamil  (CALAN -SR) 120 mg, Oral, Daily   Vitamin D  (Ergocalciferol ) (DRISDOL ) 50,000 Units, Oral, Every Thu    Diet Orders (From admission, onward)     Start     Ordered   09/17/23 1454  DIET - DYS 1 Room service appropriate? No; Fluid consistency: Thin  Diet effective now       Comments: No dairy, clear liquids only  Question Answer Comment  Room service appropriate? No   Fluid consistency: Thin      09/17/23 1453            DVT prophylaxis: Place and maintain sequential compression device Start: 07/14/23 1033   Lab Results  Component Value Date   PLT 245 09/20/2023      Code Status: Full Code  Family Communication: no family at bedside   Status is: Inpatient Remains inpatient appropriate because: severity of illness  Level of care: Med-Surg  Consultants:  ID Nephrology Oncology  Objective: Vitals:   09/20/23 1926 09/20/23 2349 09/21/23 0348 09/21/23 0721  BP: (!) 139/99 (!) 137/102 (!) 151/101 130/83  Pulse: 91 91 85 91  Resp: 18 19 18 16   Temp:  (!) 97.5 F (36.4 C) 97.9 F (36.6 C) 98 F (36.7 C)  TempSrc:  Oral Oral Oral  SpO2: 100% 94%  98% 95%  Weight:      Height:        Intake/Output Summary (Last 24 hours) at 09/21/2023 1000 Last data filed at 09/21/2023 0225 Gross per 24 hour  Intake 1140 ml  Output 3350 ml  Net -2210 ml   Wt Readings from Last 3 Encounters:  09/20/23 105 kg  06/05/23 113.7 kg  01/29/23 117.9 kg    Examination:  Constitutional: NAD Eyes: lids and conjunctivae normal, no scleral icterus ENMT: mmm Neck: normal, supple Respiratory: clear to auscultation bilaterally, no wheezing, no crackles. Normal respiratory effort.  Cardiovascular: Regular rate and rhythm, no murmurs / rubs / gallops. No LE edema. Abdomen: soft, no distention,  no tenderness. Bowel sounds positive.    Data Reviewed: I have independently reviewed following labs and imaging studies   CBC Recent Labs  Lab 09/15/23 0504 09/16/23 1224 09/17/23 0837 09/19/23 1003 09/20/23 0500  WBC 5.5 5.6 3.9* 5.2 3.9*  HGB 8.9* 8.8* 8.4* 8.6* 8.8*  HCT 27.6* 26.5* 25.1* 26.4* 25.2*  PLT 211 219 209 222 245  MCV 110.4* 109.1* 108.2* 107.8* 106.8*  MCH 35.6* 36.2* 36.2* 35.1* 37.3*  MCHC 32.2 33.2 33.5 32.6 34.9  RDW 24.6* 24.0* 23.7* 23.3* 22.9*  LYMPHSABS 0.7 0.8  --   --   --   MONOABS 0.6 0.6  --   --   --   EOSABS 0.0 0.0  --   --   --   BASOSABS 0.0 0.0  --   --   --     Recent Labs  Lab 09/17/23 0837 09/18/23 1100 09/19/23 1003 09/20/23 0500 09/21/23 0330  NA 129* 131* 134*  133* 130* 129*  K 4.4 4.4 4.1  4.1 4.4 4.1  CL 96* 99 101  101 99 99  CO2 23 24 23  23 22 22   GLUCOSE 115* 91 135*  134* 87 129*  BUN 64* 59* 54*  55* 54* 56*  CREATININE 0.97 0.98 1.00  1.08 0.95 0.95  CALCIUM  8.3* 8.1* 8.1*  7.9* 8.2* 8.4*  AST  --   --  15  --   --   ALT  --   --  10  --   --   ALKPHOS  --   --  106  --   --   BILITOT  --   --  0.5  --   --   ALBUMIN  2.0* 2.0* 2.1*  2.1* 2.1* 2.0*  MG  --   --  2.2 2.2  --      ------------------------------------------------------------------------------------------------------------------ No results for input(s): CHOL, HDL, LDLCALC, TRIG, CHOLHDL, LDLDIRECT in the last 72 hours.  Lab Results  Component Value Date   HGBA1C 6.0 (H) 08/14/2023   ------------------------------------------------------------------------------------------------------------------ No results for input(s): TSH, T4TOTAL, T3FREE, THYROIDAB in the last 72 hours.  Invalid input(s): FREET3  Cardiac Enzymes No results for input(s): CKMB, TROPONINI, MYOGLOBIN in the last 168 hours.  Invalid input(s): CK ------------------------------------------------------------------------------------------------------------------    Component Value Date/Time   BNP 301.2 (H) 07/23/2023 0816    CBG: Recent Labs  Lab 09/20/23 0645 09/20/23 1200 09/20/23 1648 09/20/23 2105 09/21/23 0610  GLUCAP 81 103* 112* 87 84    No results found for this or any previous visit (from the past 240 hours).    Radiology Studies: No results found.   Nilda Fendt, MD, PhD [facility]  Between 7 am - 7 pm I am available, please contact me via Amion (for emergencies) or Securechat (non urgent messages)  Between 7 pm - 7 am I am not available, please contact night coverage MD/APP via Amion

## 2023-09-22 ENCOUNTER — Inpatient Hospital Stay (HOSPITAL_COMMUNITY)

## 2023-09-22 DIAGNOSIS — C9000 Multiple myeloma not having achieved remission: Secondary | ICD-10-CM | POA: Diagnosis not present

## 2023-09-22 DIAGNOSIS — N179 Acute kidney failure, unspecified: Secondary | ICD-10-CM | POA: Diagnosis not present

## 2023-09-22 DIAGNOSIS — R531 Weakness: Principal | ICD-10-CM

## 2023-09-22 DIAGNOSIS — R6521 Severe sepsis with septic shock: Secondary | ICD-10-CM | POA: Diagnosis not present

## 2023-09-22 DIAGNOSIS — D649 Anemia, unspecified: Secondary | ICD-10-CM | POA: Diagnosis not present

## 2023-09-22 DIAGNOSIS — A419 Sepsis, unspecified organism: Secondary | ICD-10-CM | POA: Diagnosis not present

## 2023-09-22 LAB — CBC WITH DIFFERENTIAL/PLATELET
Abs Immature Granulocytes: 0.03 K/uL (ref 0.00–0.07)
Basophils Absolute: 0 K/uL (ref 0.0–0.1)
Basophils Relative: 0 %
Eosinophils Absolute: 0 K/uL (ref 0.0–0.5)
Eosinophils Relative: 0 %
HCT: 25.4 % — ABNORMAL LOW (ref 39.0–52.0)
Hemoglobin: 8.7 g/dL — ABNORMAL LOW (ref 13.0–17.0)
Immature Granulocytes: 1 %
Lymphocytes Relative: 24 %
Lymphs Abs: 1.6 K/uL (ref 0.7–4.0)
MCH: 36.9 pg — ABNORMAL HIGH (ref 26.0–34.0)
MCHC: 34.3 g/dL (ref 30.0–36.0)
MCV: 107.6 fL — ABNORMAL HIGH (ref 80.0–100.0)
Monocytes Absolute: 1.4 K/uL — ABNORMAL HIGH (ref 0.1–1.0)
Monocytes Relative: 21 %
Neutro Abs: 3.6 K/uL (ref 1.7–7.7)
Neutrophils Relative %: 54 %
Platelets: 266 K/uL (ref 150–400)
RBC: 2.36 MIL/uL — ABNORMAL LOW (ref 4.22–5.81)
RDW: 21.7 % — ABNORMAL HIGH (ref 11.5–15.5)
Smear Review: NORMAL
WBC: 6.6 K/uL (ref 4.0–10.5)
nRBC: 0 % (ref 0.0–0.2)

## 2023-09-22 LAB — GLUCOSE, CAPILLARY
Glucose-Capillary: 96 mg/dL (ref 70–99)
Glucose-Capillary: 153 mg/dL — ABNORMAL HIGH (ref 70–99)
Glucose-Capillary: 164 mg/dL — ABNORMAL HIGH (ref 70–99)
Glucose-Capillary: 132 mg/dL — ABNORMAL HIGH (ref 70–99)

## 2023-09-22 LAB — VORICONAZOLE, SERUM: Voriconazole, Serum: 0.6 ug/mL

## 2023-09-22 MED ORDER — MELATONIN 3 MG PO TABS
3.0000 mg | ORAL_TABLET | Freq: Once | ORAL | Status: AC
  Administered 2023-09-22: 3 mg via ORAL
  Filled 2023-09-22: qty 1

## 2023-09-22 MED ORDER — VORICONAZOLE 200 MG PO TABS
400.0000 mg | ORAL_TABLET | Freq: Two times a day (BID) | ORAL | Status: DC
  Administered 2023-09-22 – 2023-10-17 (×48): 400 mg via ORAL
  Filled 2023-09-22: qty 8
  Filled 2023-09-22 (×2): qty 2
  Filled 2023-09-22: qty 8
  Filled 2023-09-22 (×9): qty 2
  Filled 2023-09-22: qty 8
  Filled 2023-09-22 (×4): qty 2
  Filled 2023-09-22: qty 8
  Filled 2023-09-22 (×11): qty 2
  Filled 2023-09-22: qty 8
  Filled 2023-09-22 (×6): qty 2
  Filled 2023-09-22: qty 8
  Filled 2023-09-22 (×10): qty 2
  Filled 2023-09-22: qty 8
  Filled 2023-09-22: qty 2
  Filled 2023-09-22: qty 8
  Filled 2023-09-22 (×2): qty 2

## 2023-09-22 MED ORDER — OXYCODONE HCL 5 MG PO TABS
10.0000 mg | ORAL_TABLET | Freq: Four times a day (QID) | ORAL | Status: DC | PRN
  Administered 2023-09-22 – 2023-10-12 (×40): 10 mg via ORAL
  Filled 2023-09-22 (×43): qty 2

## 2023-09-22 NOTE — Progress Notes (Signed)
 Orthopedic Tech Progress Note Patient Details:  Frank [name]. Aug 13, 1965 [id number] Patient sleeping. Left sling at bedside Ortho Devices Type of Ortho Device: Arm sling Ortho Device/Splint Location: LUE Ortho Device/Splint Interventions: Ordered   Post Interventions Patient Tolerated: Well  Efrain DELENA Cos 09/22/2023, 6:56 PM

## 2023-09-22 NOTE — Progress Notes (Signed)
 Pharmacy Antibiotic Note  Frank [name]. is a 58 y.o. male admitted on 07/03/2023 with generalized weakness (multifactorial secondary to hypercalcemia, suspected malignancy, multiple myeloma).  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    Level history: 08/01/23 1.9 mcg/ml on 350 mg/12h 08/08/23 0.4 mcg/ml on 350 mg/12h (low due to tube feed binding) 08/19/23 1.3 mcg/ml continue 350 mg/12h 08/30/23 1.8 mcg/ml (drawn AFTER the dose) 08/31/23 0.6 mcg/ml drawn prior to the dose but ~2 hours late of true trough 09/08/23 0.7 mcg/ml drawn ~2 hours late of true trough and likely binding to bolus feeds 09/17/23 0.6 mcg/ml - drawn appropriately ~ 11 hours after previous dose    Patient remains on tube feeds but it appears they have been appropriately timed out and given mostly on time. Could be some small effect of binding still but less likely.   Plan: - Increase voriconazole  to  400 mg BID per tube - keep at 1000/2200 - Bolus feeds rescheduled to 0000/0800/1200/1800 - Will continue to monitor voriconazole  and levels as indicated - would recommend another recheck on 7/14   Height: 6' 0.01 (182.9 cm) Weight: 105 kg (231 lb 7.7 oz) IBW/kg (Calculated) : 77.62  Temp (24hrs), Avg:98.2 F (36.8 C), Min:97.4 F (36.3 C), Max:98.6 F (37 C)  Recent Labs  Lab 09/16/23 1224 09/17/23 0837 09/18/23 1100 09/19/23 1003 09/20/23 0500 09/21/23 0330 09/22/23 1030  WBC 5.6 3.9*  --  5.2 3.9*  --  6.6  CREATININE 0.99 0.97 0.98 1.00  1.08 0.95 0.95  --     Estimated Creatinine Clearance: 104.9 mL/min (by C-G formula based on SCr of 0.95 mg/dL).    Allergies  Allergen Reactions   Morphine  And Codeine Anaphylaxis   Shellfish Allergy Anaphylaxis   Betadine  [Povidone Iodine ] Itching   Bupropion Other (See Comments)    Shaking of the body, hallucinations    Chlorhexidine  Hives    Patient reports never had issues CHG with mouth  rinse.   Influenza Vaccines Hives   Metoprolol  Rash    Thank you for allowing pharmacy to be a part of this patient's care.  Damien Quiet, PharmD, BCPS, BCIDP Infectious Diseases Clinical Pharmacist Phone: [phone] 09/22/2023 3:14 PM   **Pharmacist phone directory can now be found on [email or web address] ([email or web address]).  Listed under [facility].

## 2023-09-22 NOTE — Progress Notes (Signed)
 Ortho Trauma Note  I went by and saw the patient.  He still undergoing chemotherapy but he is the most awake that I seen him since his hospitalization.  He has a complex problem to his left upper extremity.  I believe the multiple myeloma caused disintegration of his distal humerus.  The reconstruction options are not optimal.  He would likely need to staged approach with antibiotic cement spacer and potentially an allograft reconstructed elbow replacement.  In his multiple comorbidities this would be at significantly high risk for postoperative infection and unsure if his arm would even significantly function.  Currently he does not have much function to his left upper extremity and he has severe pain.  I did discuss with him and his wife earlier about a potential above elbow amputation.  This is where they are leaning.  I discussed risks and benefits of pursuing this.  They wish to proceed with an above elbow amputation.  I will look for my schedule to get this done and will likely not be this week or next and may be the week of July 21.  Will follow-up later this week to provide a definitive date.  I have ordered a sling per therapy's request to allow for better mobilization of that arm.  Franky MYRTIS Light, MD [facility] [phone] (office) [email or web address]

## 2023-09-22 NOTE — Progress Notes (Signed)
 Occupational Therapy Treatment Patient Details Name: Frank [name]. MRN: [id number] DOB: [birth year] Today's Date: 09/22/2023   History of present illness 58 y.o. male transferred from [facility] 07/03/23 to [facility] for management of newly diagnosed plasma cell myeloma with weakness and AMS. Pt also with AKI and metabolic encephalopathy. 5/10 transfer to [facility] for iHD. 4/22 Intubated and chemo initiated. 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 5/6 IVIG started. 5/8-6/11 trach. 5/12 iHD initiated. 5/29 PEG placed. 6/5 chemo initiated. PMHx:Lt THA, carpal tunnel syndrome, Lt TKA, Lt humerus fx, PAF, HTN, HLD, bipolar disorder, obesity, T2DM   OT comments  Patient received in supine and eager to get OOB and into wheelchair. Patient instructed on moving BLEs off EOB and required max assist +2 to complete. Patient with posterior leaning initially requiring mod assist for sitting balance and progressed to CGA with use of stedy for support. Patient attempted 2 stands from EOB with max assist +2 and use of bed pads but was unable to tolerate standing to allow for Stedy pads placement or transfer to wheelchair. Patient assisted back to bed and will attempt transfer next OT session with sliding board to wheelchair. Patient will benefit from continued inpatient follow up therapy, <3 hours/day. Acute OT to continue to follow to address established goals to facilitate DC to next venue of care.        If plan is discharge home, recommend the following:  Two people to help with walking and/or transfers;Assistance with cooking/housework;Assistance with feeding;Direct supervision/assist for medications management;Direct supervision/assist for financial management;Assist for transportation;Help with stairs or ramp for entrance;A lot of help with bathing/dressing/bathroom   Equipment Recommendations  Other (comment) (defer)    Recommendations for Other Services      Precautions / Restrictions Precautions Precautions:  Fall Recall of Precautions/Restrictions: Impaired Precaution/Restrictions Comments: episode of syncope, sacral wound, chemo. Lt humerus non-union, splinted. no elbow ROM, but shoulder ROM ok per secure chat with Dr. Jonel 7/1 Restrictions Weight Bearing Restrictions Per Provider Order: Yes LUE Weight Bearing Per Provider Order: Non weight bearing Other Position/Activity Restrictions: Lt humerus non-union, splinted. no elbow ROM, but shoulder ROM ok per secure chat with Dr. Jonel 7/1       Mobility Bed Mobility Overal bed mobility: Needs Assistance Bed Mobility: Rolling, Supine to Sit, Sit to Supine Rolling: Max assist   Supine to sit: Max assist, +2 for physical assistance, +2 for safety/equipment Sit to supine: Max assist, +2 for physical assistance   General bed mobility comments: patient attempting to move BLE off EOB but requiring max assist of 2 due to assistance with BLE and trunk. bed pad utilized for scooting towards EOB    Transfers Overall transfer level: Needs assistance Equipment used: Ambulation equipment used Transfers: Sit to/from Stand Sit to Stand: Max assist, +2 physical assistance, +2 safety/equipment           General transfer comment: 2 stands attempted from EOB with max assist +2 with use of bed pads to assist hips with patient stating discomfort at RLE knee Transfer via Lift Equipment: Stedy   Balance Overall balance assessment: Needs assistance Sitting-balance support: Single extremity supported, Feet supported Sitting balance-Leahy Scale: Fair Sitting balance - Comments: required mod assist for sitting EOB initially and progressed to CGA with UE assist using Stedy Postural control: Right lateral lean Standing balance support: Single extremity supported, During functional activity, Reliant on assistive device for balance Standing balance-Leahy Scale: Zero Standing balance comment: stood in [place] x2 with max assist +2  ADL either performed or assessed with clinical judgement   ADL Overall ADL's : Needs assistance/impaired Eating/Feeding: Set up;Bed level   Grooming: Wash/dry hands;Wash/dry face;Contact guard assist;Sitting           Upper Body Dressing : Moderate assistance;Sitting Upper Body Dressing Details (indicate cue type and reason): donned gown for front and back                   General ADL Comments: setup for lunch provided prior to visit    Extremity/Trunk Assessment Upper Extremity Assessment Upper Extremity Assessment: Generalized weakness RUE Deficits / Details: Generalized weakness, pt using functionally during bed mobility, impaired grasp (loose). propping self with RUE. RUE Sensation: WNL LUE Deficits / Details: Lt humerus non-union, splinted. no elbow ROM, but shoulder ROM ok per secure chat with Dr. Jonel 7/1 LUE Sensation: decreased light touch LUE Coordination: decreased fine motor;decreased gross motor            Vision       Perception     Praxis     Communication Communication Communication: Impaired Factors Affecting Communication: Reduced clarity of speech   Cognition Arousal: Alert Behavior During Therapy: WFL for tasks assessed/performed Cognition: Cognition impaired     Awareness: Intellectual awareness intact   Attention impairment (select first level of impairment): Sustained attention Executive functioning impairment (select all impairments): Problem solving                   Following commands: Impaired Following commands impaired: Follows one step commands with increased time      Cueing   Cueing Techniques: Verbal cues, Tactile cues, Visual cues  Exercises      Shoulder Instructions       General Comments      Pertinent Vitals/ Pain       Pain Assessment Pain Assessment: Faces Faces Pain Scale: Hurts little more Pain Location: bottom, RLE knee Pain Descriptors / Indicators: Sore, Discomfort,  Grimacing, Numbness Pain Intervention(s): Monitored during session, Repositioned  Home Living                                          Prior Functioning/Environment              Frequency  Min 1X/week        Progress Toward Goals  OT Goals(current goals can now be found in the care plan section)  Progress towards OT goals: Progressing toward goals  Acute Rehab OT Goals Patient Stated Goal: to get into wheelchair OT Goal Formulation: With patient Time For Goal Achievement: 09/25/23 Potential to Achieve Goals: Fair ADL Goals Pt Will Perform Grooming: with supervision;with set-up;sitting Pt Will Perform Upper Body Dressing: with contact guard assist;with supervision;sitting Pt Will Transfer to Toilet: with max assist;with mod assist;stand pivot transfer;bedside commode Pt/caregiver will Perform Home Exercise Program: Right Upper extremity;Increased strength;With written HEP provided;Independently Additional ADL Goal #1: Pt will complete bed mobility mod A  to sit EOB in prep for ADLs/selfcare tasks  Plan      Co-evaluation    PT/OT/SLP Co-Evaluation/Treatment: Yes Reason for Co-Treatment: For patient/therapist safety;To address functional/ADL transfers   OT goals addressed during session: ADL's and self-care      AM-PAC OT 6 Clicks Daily Activity     Outcome Measure   Help from another person eating meals?: A Little Help from another person taking care of  personal grooming?: A Little Help from another person toileting, which includes using toliet, bedpan, or urinal?: Total Help from another person bathing (including washing, rinsing, drying)?: A Lot Help from another person to put on and taking off regular upper body clothing?: A Lot Help from another person to put on and taking off regular lower body clothing?: Total 6 Click Score: 12    End of Session Equipment Utilized During Treatment: Gait belt;Other (comment) Laurent)  OT Visit  Diagnosis: Unsteadiness on feet (R26.81);Other abnormalities of gait and mobility (R26.89);Muscle weakness (generalized) (M62.81);Pain Pain - Right/Left: Right Pain - part of body: Knee (bottom)   Activity Tolerance Patient tolerated treatment well;Patient limited by pain   Patient Left in bed;with call bell/phone within reach   Nurse Communication Mobility status        Time: 8575-8484 OT Time Calculation (min): 51 min  Charges: OT General Charges $OT Visit: 1 Visit OT Treatments $Self Care/Home Management : 8-22 mins $Therapeutic Activity: 8-22 mins  [name], OTA Acute Rehabilitation Services  Office [phone]   [name] 09/22/2023, 3:43 PM

## 2023-09-22 NOTE — Plan of Care (Signed)
 States BM x 2 early am with dressing change. Food and drinks well tolerated. Said ortho talked about LUE amputation with him today.    Problem: Education: Goal: Knowledge of General Education information will improve Description: Including pain rating scale, medication(s)/side effects and non-pharmacologic comfort measures Outcome: Progressing   Problem: Safety: Goal: Ability to remain free from injury will improve Outcome: Progressing   Problem: Skin Integrity: Goal: Risk for impaired skin integrity will decrease Outcome: Progressing   Problem: Fluid Volume: Goal: Ability to maintain a balanced intake and output will improve Outcome: Progressing   Problem: Nutritional: Goal: Maintenance of adequate nutrition will improve Outcome: Progressing

## 2023-09-22 NOTE — Progress Notes (Signed)
 Physical Therapy Treatment Patient Details Name: Frank [name]. MRN: [id number] DOB: 15-May-1965 Today's Date: 09/22/2023   History of Present Illness 58 y.o. male transferred from [facility] 07/03/23 to [facility] for management of newly diagnosed plasma cell myeloma with weakness and AMS. Pt also with AKI and metabolic encephalopathy. 5/10 transfer to [facility] for iHD. 4/22 Intubated and chemo initiated. 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 5/6 IVIG started. 5/8-6/11 trach. 5/12 iHD initiated. 5/29 PEG placed. 6/5 chemo initiated. PMHx:Lt THA, carpal tunnel syndrome, Lt TKA, Lt humerus fx, PAF, HTN, HLD, bipolar disorder, obesity, T2DM    PT Comments  Pt very eager to participate in therapy today and would very much like to get up to wheelchair and get out of his room. Pt reports onset of new numbness, tingling and weakness in his L LE, MD made aware. L LE increase pt need for assistance requiring maxAx2 for bed mobility and transfers with [facility]. Pt relays how much he would like to be able to transfer to a wheelchair to get out of the room and eventually discharge to somewhere closer to his home in [place]. PT/OT considering pt for [facility] program, and will follow back tomorrow for additional therapy session.    If plan is discharge home, recommend the following: Two people to help with walking and/or transfers;Assistance with cooking/housework;Assist for transportation;Two people to help with bathing/dressing/bathroom;Direct supervision/assist for medications management;Direct supervision/assist for financial management;Help with stairs or ramp for entrance;Assistance with feeding   Can travel by private vehicle     No  Equipment Recommendations  Hospital bed;Hoyer lift;Wheelchair (measurements PT);Wheelchair cushion (measurements PT);BSC/3in1       Precautions / Restrictions Precautions Precautions: Fall Recall of Precautions/Restrictions: Impaired Precaution/Restrictions Comments: episode of syncope,  sacral wound, chemo. Lt humerus non-union, splinted. no elbow ROM, but shoulder ROM ok per secure chat with Dr. Jonel 7/1 Restrictions Weight Bearing Restrictions Per Provider Order: Yes LUE Weight Bearing Per Provider Order: Non weight bearing Other Position/Activity Restrictions: Lt humerus non-union, splinted. no elbow ROM, but shoulder ROM ok per secure chat with Dr. Jonel 7/1     Mobility  Bed Mobility Overal bed mobility: Needs Assistance Bed Mobility: Rolling, Supine to Sit, Sit to Supine Rolling: Max assist   Supine to sit: Max assist, +2 for physical assistance, +2 for safety/equipment Sit to supine: Max assist, +2 for physical assistance   General bed mobility comments: patient attempting to move BLE off EOB but requiring max assist of 2 due to assistance with BLE and trunk. bed pad utilized for scooting towards EOB    Transfers Overall transfer level: Needs assistance Equipment used: Ambulation equipment used Transfers: Sit to/from Stand Sit to Stand: Max assist, +2 physical assistance, +2 safety/equipment           General transfer comment: 2 stands attempted from EOB with max assist +2 with use of bed pads to assist hips with patient stating discomfort at RLE knee Transfer via Lift Equipment: Stedy        Balance Overall balance assessment: Needs assistance Sitting-balance support: Single extremity supported, Feet supported Sitting balance-Leahy Scale: Fair Sitting balance - Comments: required mod assist for sitting EOB initially and progressed to CGA with UE assist using Stedy Postural control: Right lateral lean Standing balance support: Single extremity supported, During functional activity, Reliant on assistive device for balance Standing balance-Leahy Scale: Zero Standing balance comment: stood in [place] x2 with max assist +2  Communication Communication Communication: Impaired Factors Affecting Communication:  Reduced clarity of speech  Cognition Arousal: Alert Behavior During Therapy: WFL for tasks assessed/performed                             Following commands: Impaired Following commands impaired: Follows one step commands with increased time    Cueing Cueing Techniques: Verbal cues, Tactile cues, Visual cues  Exercises Other Exercises Other Exercises: x2 sit<>stand,    General Comments General comments (skin integrity, edema, etc.): Pt is very jovial during session, again expressed how much he wants to be near his grandson in [place]      Pertinent Vitals/Pain Pain Assessment Pain Assessment: Faces Faces Pain Scale: Hurts little more Pain Location: bottom, RLE knee Pain Descriptors / Indicators: Sore, Discomfort, Grimacing, Numbness Pain Intervention(s): Limited activity within patient's tolerance, Monitored during session, Repositioned     PT Goals (current goals can now be found in the care plan section) Acute Rehab PT Goals PT Goal Formulation: With patient Time For Goal Achievement: 09/25/23 Potential to Achieve Goals: Fair Progress towards PT goals: Progressing toward goals    Frequency    Min 1X/week           Co-evaluation PT/OT/SLP Co-Evaluation/Treatment: Yes Reason for Co-Treatment: For patient/therapist safety;To address functional/ADL transfers PT goals addressed during session: Mobility/safety with mobility;Balance OT goals addressed during session: ADL's and self-care      AM-PAC PT 6 Clicks Mobility   Outcome Measure  Help needed turning from your back to your side while in a flat bed without using bedrails?: A Lot Help needed moving from lying on your back to sitting on the side of a flat bed without using bedrails?: Total Help needed moving to and from a bed to a chair (including a wheelchair)?: Total Help needed standing up from a chair using your arms (e.g., wheelchair or bedside chair)?: Total Help needed to walk in hospital  room?: Total Help needed climbing 3-5 steps with a railing? : Total 6 Click Score: 7    End of Session Equipment Utilized During Treatment: Gait belt Activity Tolerance: Patient tolerated treatment well Patient left: in bed;with call bell/phone within reach Nurse Communication: Mobility status (need for Geo mat) PT Visit Diagnosis: Other abnormalities of gait and mobility (R26.89);Muscle weakness (generalized) (M62.81);Difficulty in walking, not elsewhere classified (R26.2);Unsteadiness on feet (R26.81)     Time: 8575-8484 PT Time Calculation (min) (ACUTE ONLY): 51 min  Charges:    $Therapeutic Activity: 8-22 mins PT General Charges $$ ACUTE PT VISIT: 1 Visit                     Annabeth Tortora B. Fleeta Lapidus PT, DPT Acute Rehabilitation Services Please use secure chat or  Call Office [phone]    [name] 09/22/2023, 4:12 PM

## 2023-09-22 NOTE — Progress Notes (Signed)
 This chaplain is present for F/U spiritual care. The Pt. is participating in PT at the time of the chaplain visit. The chaplain understands the Pt. wife visited on Sunday. Plans were made for the chaplain to F/U on the Pt. card to his grandson on another day.  Chaplain Leeroy Hummer [phone]

## 2023-09-22 NOTE — Progress Notes (Signed)
 PROGRESS NOTE  Frank [name]. FMW:[id number] DOB: [birth year] DOA: 07/03/2023 PCP: [name], MD   LOS: 81 days   Brief Narrative / Interim history: 58 year old PMH OSA CPAP, pathological fracture and multiple ORIF left Humerus likely due to multiple myeloma, A-fib not on anticoagulation, hyperlipidemia, tobacco use disorder who was  feeling weak for 1 week, admitted  to [facility] with concern of sepsis, community-acquired pneumonia and acute encephalopathy.  Patient was found to have lytic lesions,  he was transferred to [facility] for  oncology treatment.  In the meantime he developed encephalopathy, acute renal failure.  He remained in the ICU for 36 days.  Underwent tracheostomy. He was also started on HD after  CRRT.  Subsequently PEG tube placed 5/29.   Significant events 4/17-Admitted to medical floor with oncology consultation to treat multiple myeloma. 4/19-transferred to ICU intubated, encephalopathic. 4/20-A-fib with RVR. Started heparin . Groin and biopsy site hematoma requiring multiple transfusions. EEG 4/25 >> moderate to severe encephalopathy without any seizures or epileptiform discharges. 4/29 Received Velcade  and Cytoxan  chemotherapy-intent palliative? 5/2 LP and ETT exchange  5/8 Trach placed. Some post op bleeding improved with thrombi-pad 5/10 Voriconazole  added back based on CT Chest findings 5/13 more alert and able to follow some commands. 5/23, transferred to floor. 5/29-PEG placed 5/31 chemotherapy changed to Sarclisa /Kyprolis /Cytoxan . 6/13 plan to remove HD cathter for holiday line 6/16 new HD catheter placed 6/23 has not required HD for a week, HD catheter removed 6/28 finished first cycle of chemo, next cycle to start in 2 weeks  Subjective / 24h Interval events: He denies any chest pain, denies any shortness of breath.  No abdominal pain, no nausea or vomiting.  Asking me whether he can be assisted to be in a wheelchair and move a little bit  more in the room  Asking me to reach out to orthopedic surgery to see if there are any other plans in place for his left arm  Assesement and Plan: Principal Problem:   Severe sepsis with septic shock (HCC) Active Problems:   Aspergillosis, unspecified (HCC)   Multiple myeloma not having achieved remission (HCC)   Lobar pneumonia, unspecified organism (HCC)   Closed fracture of left distal humerus   AKI (acute kidney injury) (HCC)   Acute hypoxemic respiratory failure (HCC)   PAF (paroxysmal atrial fibrillation) (HCC)   Acute metabolic encephalopathy   Protein-calorie malnutrition, severe   Hematoma of right thigh   Seizure disorder (HCC)   DMII (diabetes mellitus, type 2) (HCC)   Pressure injury of skin   CAD (coronary artery disease)   Chronic diastolic CHF (congestive heart failure) (HCC)   CKD (chronic kidney disease), stage IIIa (HCC)   Class 1 obesity   Asthma   Bipolar disorder (HCC)   Febrile neutropenia (HCC)   Principal problem Recurrent fevers, sepsis, Staph Epi Bacteremia -Hospital course complicated by intermittent fevers, ID consulted and most recently evaluated patient 6/17.  He had a fever and MRSE bacteremia 1/4 bottles, completed a course of vancomycin  with the end date 6/20.  He did receive a line holiday.  Repeat blood culture 6/9 without growth. - He has unstageable sacral ulcer, continue local wound care.  Afebrile, no leukocytosis  Active problems  Aspergillosis with pneumonia -On Voriconazole  per ID, On Acyclovir .  End date for voriconazole  is 8/5, continue   Multiple myeloma -oncology following. Started on Velcade  and Cytoxan  by Oncology, transitioned to Kyprolis  then Sarclisa .   - Sarclisa  per Oncology - Next  dose chemo due ~7/14   Goals of care - Unfortunately poor long-term prognosis. Palliative care has discussed with wife previously.  Remain full code on full scope of care.  Awaiting SNF placement but he needs to be able to transfer better and be  able to sit up in the chair for outpatient chemo which he is unable to do currently   Acute Hypoxic Respiratory Failure - Pulmonology following intermittently, trach removed 6/11.  Respiratory status stable   Right Thigh Hematoma - per CT on 08/01/23: 12.1 x 30.2 x 7.6 cm subcutaneous hematoma within the anterior right thigh. 8.4 x 10.4 x 22.2 cm subcutaneous hematoma within the right gluteal region - continue supportive care.  Hemoglobin stable   Severe acute metabolic encephalopathy with hypoactive delirium, seizure disorder - Normal MRI of the brain.  LP negative for meningitis or encephalitis.  EEG negative for seizures but encephalopathy. Home Depakote  discontinued in ICU.   Insulin -dependent diabetes type 2 with hyperglycemia- A1c 6.0, continue with sliding scale insulin .  2 episodes of hypoglycemia in the last 24 hours.  Further decrease Lantus  dose today  CBG (last 3)  Recent Labs    09/21/23 1641 09/21/23 2102 09/22/23 0608  GLUCAP 175* 155* 96    AKI on CKD stage IIIa, Uremia,  Hyponatremia, Hypokalemia -Due to MM, hypercalcemia and ACE inhibitors?  Nephrology consulted, was on hemodialysis but eventually he tolerated being off HD, tunneled catheter discontinued per nephrology.  Renal function has recovered   Paroxysmal A-fib - Unable to  anticoagulation due to hematoma. Rate controlled.    Obstructive Sleep Apnea -Trach removed   Anxiety/Bipolar disorder - Continue with Paxil .  Mood appears stable over the last few days   Severe Malnutrition - Continue tube feeding.  He is allowed clear liquid per speech   History of Pathological fracture and multiple ORIF left Humerus 01/2023 -Dr. Jonel discussed with orthopedics on 6/26, options would involve either a substantial reconstruction with antibiotic cement and prolonged, difficult recovery, or above elbow amputation.  [name] recommends more rehabilitation at this time prior to attempting surgery. - Maintain left arm splint  when out of bed - At patient's request, I have reached out to [name] again today since it is not clear when he will be discharged   Hypokalemia -potassium now normalized.  Repeat tomorrow   Hyponatremia -sodium overall stable, repeat tomorrow    Scheduled Meds:  acyclovir   400 mg Oral BID   enoxaparin  (LOVENOX ) injection  50 mg Subcutaneous Daily   feeding supplement (NEPRO CARB STEADY)  237 mL Per Tube TID PC & HS   feeding supplement (PROSource TF20)  60 mL Per Tube BID   insulin  aspart  0-15 Units Subcutaneous TID AC & HS   insulin  aspart  2 Units Subcutaneous TID AC & HS   insulin  glargine-yfgn  10 Units Subcutaneous Q24H   montelukast   5 mg Oral QHS   multivitamin with minerals  1 tablet Oral Daily   nutrition supplement (JUVEN)  1 packet Oral BID BM   mouth rinse  15 mL Mouth Rinse 4 times per day   PARoxetine   40 mg Oral Daily   polyethylene glycol  17 g Oral BID   senna  1 tablet Oral Daily   sodium chloride  flush  10-40 mL Intracatheter Q12H   sodium hypochlorite   Irrigation Daily   umeclidinium-vilanterol  1 puff Inhalation Daily   valproic  acid  750 mg Oral BID   voriconazole   350 mg Oral  Q12H   Continuous Infusions:  sodium chloride  Stopped (08/28/23 1208)   sodium chloride  Stopped (08/28/23 0839)   PRN Meds:.acetaminophen  (TYLENOL ) oral liquid 160 mg/5 mL **OR** acetaminophen , hydrALAZINE , hydrOXYzine , ipratropium-albuterol , ondansetron  (ZOFRAN ) IV, oxyCODONE , artificial tears, sodium chloride  flush  Current Outpatient Medications  Medication Instructions   albuterol  (PROVENTIL  HFA;VENTOLIN  HFA) 108 (90 Base) MCG/ACT inhaler 2 puffs, Inhalation, Every 6 hours PRN   albuterol  (PROVENTIL ) 2.5 mg, Every 2 hours PRN   aspirin  EC 81 mg, Oral, Daily, Swallow whole.   buPROPion (WELLBUTRIN XL) 150 mg, Daily   busPIRone  (BUSPAR ) 15 mg, Oral, Daily at bedtime   celecoxib  (CELEBREX ) 200 mg, Oral, Daily   clonazePAM  (KLONOPIN ) 1 mg, Oral, Once PRN   cyclobenzaprine   (FLEXERIL ) 5 mg, Oral, 3 times daily PRN   divalproex  (DEPAKOTE ) 500 mg, Oral, 2 times daily   EPINEPHrine  (EPI-PEN) 0.3 mg, Intramuscular, As needed   esomeprazole (NEXIUM) 40 mg, Oral, Every morning   Fluticasone -Salmeterol (ADVAIR) 500-50 MCG/DOSE AEPB 2 puffs, Inhalation, Every morning   furosemide  (LASIX ) 40 mg, Oral, Daily PRN   gabapentin  (NEURONTIN ) 300-600 mg, Oral, See admin instructions, 300 mg in the morning, 600 mg at bedtime    hydrALAZINE  (APRESOLINE ) 10 mg, Oral, 2 times daily   hydrochlorothiazide  (HYDRODIURIL ) 25 mg, Oral, Daily   lisinopril  (ZESTRIL ) 40 mg, Oral, Daily   metFORMIN  (GLUCOPHAGE ) 1,000 mg, Oral, 2 times daily with meals   montelukast  (SINGULAIR ) 10 mg, Oral, Daily at bedtime   OVER THE COUNTER MEDICATION 400 mg, Oral, Daily at bedtime, Burdock root    oxyCODONE  (ROXICODONE ) 5 mg, Oral, Every 4 hours PRN   PARoxetine  (PAXIL ) 40 mg, Oral, BH-each morning   spironolactone  (ALDACTONE ) 25 mg, Oral, Daily at bedtime   tamsulosin  (FLOMAX ) 0.4 mg, Oral, Daily at bedtime   testosterone  cypionate (DEPOTESTOSTERONE CYPIONATE) 100 mg, Intramuscular, Every 14 days   verapamil  (CALAN -SR) 120 mg, Oral, Daily   Vitamin D  (Ergocalciferol ) (DRISDOL ) 50,000 Units, Oral, Every Thu    Diet Orders (From admission, onward)     Start     Ordered   09/17/23 1454  DIET - DYS 1 Room service appropriate? No; Fluid consistency: Thin  Diet effective now       Comments: No dairy, clear liquids only  Question Answer Comment  Room service appropriate? No   Fluid consistency: Thin      09/17/23 1453            DVT prophylaxis: Place and maintain sequential compression device Start: 07/14/23 1033   Lab Results  Component Value Date   PLT 245 09/20/2023      Code Status: Full Code  Family Communication: no family at bedside   Status is: Inpatient Remains inpatient appropriate because: severity of illness  Level of care: Med-Surg  Consultants:   ID Nephrology Oncology  Objective: Vitals:   09/21/23 2036 09/21/23 2358 09/22/23 0346 09/22/23 0732  BP: (!) 144/91 131/88 (!) 133/100 (!) 148/95  Pulse: 96 92 96 97  Resp: 19 18 18 18   Temp: 98.6 F (37 C) (!) 97.4 F (36.3 C) 98 F (36.7 C) 98.5 F (36.9 C)  TempSrc: Oral Oral Oral Oral  SpO2: 98% 100% 96% 95%  Weight:      Height:        Intake/Output Summary (Last 24 hours) at 09/22/2023 1108 Last data filed at 09/22/2023 0937 Gross per 24 hour  Intake 240 ml  Output 6100 ml  Net -5860 ml   Wt Readings from Last 3  Encounters:  09/20/23 105 kg  06/05/23 113.7 kg  01/29/23 117.9 kg    Examination:  Constitutional: NAD Eyes: lids and conjunctivae normal, no scleral icterus ENMT: mmm Neck: normal, supple Respiratory: clear to auscultation bilaterally, no wheezing, no crackles. Normal respiratory effort.  Cardiovascular: Regular rate and rhythm, no murmurs / rubs / gallops. No LE edema. Abdomen: soft, no distention, no tenderness. Bowel sounds positive.   Data Reviewed: I have independently reviewed following labs and imaging studies   CBC Recent Labs  Lab 09/16/23 1224 09/17/23 0837 09/19/23 1003 09/20/23 0500  WBC 5.6 3.9* 5.2 3.9*  HGB 8.8* 8.4* 8.6* 8.8*  HCT 26.5* 25.1* 26.4* 25.2*  PLT 219 209 222 245  MCV 109.1* 108.2* 107.8* 106.8*  MCH 36.2* 36.2* 35.1* 37.3*  MCHC 33.2 33.5 32.6 34.9  RDW 24.0* 23.7* 23.3* 22.9*  LYMPHSABS 0.8  --   --   --   MONOABS 0.6  --   --   --   EOSABS 0.0  --   --   --   BASOSABS 0.0  --   --   --     Recent Labs  Lab 09/17/23 0837 09/18/23 1100 09/19/23 1003 09/20/23 0500 09/21/23 0330  NA 129* 131* 134*  133* 130* 129*  K 4.4 4.4 4.1  4.1 4.4 4.1  CL 96* 99 101  101 99 99  CO2 23 24 23  23 22 22   GLUCOSE 115* 91 135*  134* 87 129*  BUN 64* 59* 54*  55* 54* 56*  CREATININE 0.97 0.98 1.00  1.08 0.95 0.95  CALCIUM  8.3* 8.1* 8.1*  7.9* 8.2* 8.4*  AST  --   --  15  --   --   ALT  --   --  10  --    --   ALKPHOS  --   --  106  --   --   BILITOT  --   --  0.5  --   --   ALBUMIN  2.0* 2.0* 2.1*  2.1* 2.1* 2.0*  MG  --   --  2.2 2.2  --     ------------------------------------------------------------------------------------------------------------------ No results for input(s): CHOL, HDL, LDLCALC, TRIG, CHOLHDL, LDLDIRECT in the last 72 hours.  Lab Results  Component Value Date   HGBA1C 6.0 (H) 08/14/2023   ------------------------------------------------------------------------------------------------------------------ No results for input(s): TSH, T4TOTAL, T3FREE, THYROIDAB in the last 72 hours.  Invalid input(s): FREET3  Cardiac Enzymes No results for input(s): CKMB, TROPONINI, MYOGLOBIN in the last 168 hours.  Invalid input(s): CK ------------------------------------------------------------------------------------------------------------------    Component Value Date/Time   BNP 301.2 (H) 07/23/2023 0816    CBG: Recent Labs  Lab 09/21/23 0610 09/21/23 1115 09/21/23 1641 09/21/23 2102 09/22/23 0608  GLUCAP 84 176* 175* 155* 96    No results found for this or any previous visit (from the past 240 hours).    Radiology Studies: No results found.   Nilda Fendt, MD, PhD [facility]  Between 7 am - 7 pm I am available, please contact me via Amion (for emergencies) or Securechat (non urgent messages)  Between 7 pm - 7 am I am not available, please contact night coverage MD/APP via Amion

## 2023-09-22 NOTE — Progress Notes (Signed)
 Frank [name].   DOB:April 19, 1965   FM#:[id number]      ASSESSMENT & PLAN:  Frank [name] is a 58 year old male patient admitted on 07/03/2023 as a direct transfer from [facility] in [place] .  There was a concern for multiple myeloma and oncology was asked to see him.  Patient has been followed very closely by [name].  Multiple myeloma - Patient has been hospitalized x 3 months.  Was being worked up at [facility] in [place] for multiple myeloma. - Myeloma confirmed during this hospitalization - Initiated chemotherapy on 08/21/2023. Initially given Velcade /Cytoxan . Now on Sarclisa /Kyprolis /Cytoxan .  Next chemotherapy cycle 2 scheduled for 10/02/2023. - Myeloma is responding well to treatment, IgG decreased from 11,507 to WNL 1470. Frank [name] Patient is improving significantly and talking well. -- Continue PT/Rehab as ordered.  - Medical oncology/[name] managing  Anemia - Likely secondary to malignancy, renal dysfunction, recent chemotherapy - Hemoglobin 8.8 on 7/5 - Recommend PRBC transfusion for hemoglobin <7.0 - Continue to monitor CBC with differential  AKI - Status post HD.  Due to improvement in renal function, HD was discontinued. -- Continue to monitor renal function   Code Status Full  Subjective:  Patient seen awake and alert laying in bed.  PT staff at bedside with patient.  Reports that he feels okay.  Denies pain or other acute symptoms.  Objective:   Intake/Output Summary (Last 24 hours) at 09/22/2023 1416 Last data filed at 09/22/2023 1339 Gross per 24 hour  Intake 240 ml  Output 6100 ml  Net -5860 ml     PHYSICAL EXAMINATION: ECOG PERFORMANCE STATUS: 3 - Symptomatic, >50% confined to bed  Vitals:   09/22/23 0732 09/22/23 1212  BP: (!) 148/95 (!) 149/93  Pulse: 97 91  Resp: 18   Temp: 98.5 F (36.9 C) 98.2 F (36.8 C)  SpO2: 95% 96%   Filed Weights   09/15/23 0427 09/19/23 0500 09/20/23 0500  Weight: 227 lb 1.2 oz (103 kg) 233 lb 11 oz  (106 kg) 231 lb 7.7 oz (105 kg)    GENERAL: alert, no distress and comfortable SKIN: +pale skin color, texture, turgor are normal, no rashes or significant lesions EYES: normal, conjunctiva are pink and non-injected, sclera clear OROPHARYNX: no exudate, no erythema and lips, buccal mucosa, and tongue normal  NECK: supple, thyroid normal size, non-tender, without nodularity LYMPH: no palpable lymphadenopathy in the cervical, axillary or inguinal LUNGS: clear to auscultation and percussion with normal breathing effort HEART: regular rate & rhythm and no murmurs and no lower extremity edema ABDOMEN: abdomen soft, non-tender and normal bowel sounds MUSCULOSKELETAL: no cyanosis of digits and no clubbing +unable to ambulate PSYCH: alert & oriented x 3 with fluent speech NEURO: no focal motor/sensory deficits   All questions were answered. The patient knows to call the clinic with any problems, questions or concerns.   The total time spent in the appointment was 40 minutes encounter with patient including review of chart and various tests results, discussions about plan of care and coordination of care plan  Frank JINNY Brunner, NP 09/22/2023 2:16 PM    Labs Reviewed:  Lab Results  Component Value Date   WBC 6.6 09/22/2023   HGB 8.7 (L) 09/22/2023   HCT 25.4 (L) 09/22/2023   MCV 107.6 (H) 09/22/2023   PLT 266 09/22/2023   Recent Labs    07/23/23 0416 07/23/23 1722 08/28/23 0515 08/29/23 0752 09/04/23 0412 09/05/23 0522 09/12/23 0921 09/12/23 0922 09/19/23 1003 09/20/23 0500 09/21/23  0330  NA 128*   < > 131*   < > 136   < > 130*   < > 134*  133* 130* 129*  K 4.2   < > 3.5   < > 3.7   < > 3.9   < > 4.1  4.1 4.4 4.1  CL 99   < > 94*   < > 96*   < > 97*   < > 101  101 99 99  CO2 24   < > 26   < > 29   < > 24   < > 23  23 22 22   GLUCOSE 128*   < > 86   < > 123*   < > 101*   < > 135*  134* 87 129*  BUN 33*   < > 60*   < > 108*   < > 60*   < > 54*  55* 54* 56*  CREATININE 1.62*    < > 1.67*   < > 1.65*   < > 1.06   < > 1.00  1.08 0.95 0.95  CALCIUM  7.5*   < > 8.5*   < > 10.5*   < > 8.7*   < > 8.1*  7.9* 8.2* 8.4*  GFRNONAA 49*   < > 47*   < > 48*   < > >60   < > >60  >60 >60 >60  PROT 8.5*   < > 6.5   < > 5.9*  --  4.9*  --  5.2*  --   --   ALBUMIN  <1.5*  <1.5*   < > 2.4*  2.4*   < > 2.1*   < > 2.0*   < > 2.1*  2.1* 2.1* 2.0*  AST 19   < > 21   < > 14*  --  19  --  15  --   --   ALT 14   < > 16   < > 8  --  12  --  10  --   --   ALKPHOS 59   < > 55   < > 74  --  113  --  106  --   --   BILITOT 0.5   < > 0.6   < > 0.7  --  0.3  --  0.5  --   --   BILIDIR <0.1  --  0.1  --   --   --   --   --   --   --   --   IBILI NOT CALCULATED  --  0.5  --   --   --   --   --   --   --   --    < > = values in this interval not displayed.    Studies Reviewed:  DG Humerus Left Result Date: 09/15/2023 CLINICAL DATA:  Myeloma. EXAM: LEFT HUMERUS - 2+ VIEW COMPARISON:  09/01/2023 FINDINGS: Plate and screw fixation of distal humerus. Diffuse bony demineralization of the distal humerus with innumerable cystic lucencies, without significant interval change. Suspected pathologic fracture fixated by a hardware. No new fracture. The proximal humerus is intact. IMPRESSION: Stable radiographic appearance of the distal humerus with plate and screw fixation. Diffusely decreased bone mineralization with innumerable cystic lucencies distally, grossly unchanged from prior. Given provided history of myeloma this is likely due to diffuse myomatous change, although infection is not excluded on imaging findings alone. Electronically Signed  By: Andrea Gasman M.D.   On: 09/15/2023 15:23   IR Removal Tun Cv Cath W/O FL Result Date: 09/09/2023 INDICATION: Dialysis catheter is no longer needed. EXAM: REMOVAL TUNNELED CENTRAL VENOUS CATHETER MEDICATIONS: None ANESTHESIA/SEDATION: None FLUOROSCOPY: None COMPLICATIONS: None immediate. PROCEDURE: Informed written consent was obtained from the patient after a  thorough discussion of the procedural risks, benefits and alternatives. All questions were addressed. The patient's right chest and catheter was prepped and draped in a normal sterile fashion and manual traction was used by the IR tech to remove the catheter in its entirety. Direct pressure was held by the IR tech until hemostasis was obtained. A sterile dressing was applied. The patient tolerated the procedure well with no immediate complications. IMPRESSION: Successful catheter removal as described above. Performed by Laymon Coast, NP Electronically Signed   By: Juliene Balder M.D.   On: 09/09/2023 14:04   DG Swallowing Func-Speech Pathology Result Date: 09/08/2023 Table formatting from the original result was not included. Modified Barium Swallow Study Patient Details Name: Peyson Postema. MRN: [id number] Date of Birth: January 06, 1966 Today's Date: 09/08/2023 HPI/PMH: HPI: HPI: 57 y.o. male transferred from [facility] 07/03/23 to [facility] for management of newly diagnosed plasma cell myeloma with weakness and AMS. Pt also with AKI and metabolic encephalopathy. 5/10 transfer to [facility] for iHD.Bedside swallow eval 4/18 wiht recs for NPO due to somnolence. 4/22 Intubated and chemo initiated. 4/23-5/10 CRRT. 5/6 IVIG started. 5/8 trach. 5/12 iHD initiated. 5/18 inability to oxygenate through trach, intubated and trach revision. PMHx:Lt THA, carpal tunnel syndrome, Lt TKA, Lt humerus fx, PAF, HTN, HLD, bipolar disorder, obesity, T2DM. Clinical Impression: Clinical Impression: Patient participated in severely limited MBS this date with max encouragement, demonstrating visible pain and discomfort throughout entire duration though agreeable to singular swallow of thin liquids. During this swallow, visibility was limited due to shoulder positioning, thus unable to fully visualize the airway below the vocal folds. Despite positioning and visibility difficulty, suspect material from single sip was merely penetrated with  penetrated materials reflexively leaving the laryngeal vestibule with completion of the swallow. No cough appreciated, which in this case, given results of FEES completed 6/18, may indicate no aspiration. Patient demanded to cease swallow study, refusing any further attempts. Provided education re: inability to visualize swallowing with any solids resulting in inability to recommend any changes to current diet; recommend reinitiation of clear liquids as recommended after FEES 6/18. Will reattempt MBS in more optimal positioning for patient comfort at next available opportuniity. Factors that may increase risk of adverse event in presence of aspiration Noe & Lianne 2021): Factors that may increase risk of adverse event in presence of aspiration Noe & Lianne 2021): Reduced cognitive function; Frail or deconditioned; Dependence for feeding and/or oral hygiene; Inadequate oral hygiene; Limited mobility; Poor general health and/or compromised immunity Recommendations/Plan: Swallowing Evaluation Recommendations Swallowing Evaluation Recommendations Recommendations: PO diet PO Diet Recommendation: Clear liquid diet Liquid Administration via: Straw; Cup Medication Administration: Via alternative means Supervision: Full supervision/cueing for swallowing strategies Swallowing strategies  : Hard cough after swallowing Postural changes: Partially reclined for meals (due to comfort) Oral care recommendations: Oral care before ice chips/water  Treatment Plan Treatment Plan Treatment recommendations: Therapy as outlined in treatment plan below Follow-up recommendations: Follow physicians's recommendations for discharge plan and follow up therapies Functional status assessment: Patient has had a recent decline in their functional status and demonstrates the ability to make significant improvements in function in a reasonable and predictable amount of time. Treatment  frequency: Min 2x/week Treatment duration: 2 weeks  Interventions: Aspiration precaution training; Oropharyngeal exercises; Compensatory techniques; Patient/family education; Trials of upgraded texture/liquids; Diet toleration management by SLP Recommendations Recommendations for follow up therapy are one component of a multi-disciplinary discharge planning process, led by the attending physician.  Recommendations may be updated based on patient status, additional functional criteria and insurance authorization. Assessment: Orofacial Exam: Orofacial Exam Oral Cavity - Dentition: Dentures, top Orofacial Anatomy: WFL Anatomy: Anatomy: WFL Boluses Administered: Boluses Administered Boluses Administered: Thin liquids (Level 0)  Oral Impairment Domain: Oral Impairment Domain Lip Closure: No labial escape Tongue control during bolus hold: Not tested Oral residue: Complete oral clearance Initiation of pharyngeal swallow : Pyriform sinuses  Pharyngeal Impairment Domain: Pharyngeal Impairment Domain Soft palate elevation: No bolus between soft palate (SP)/pharyngeal wall (PW) Laryngeal elevation: Complete superior movement of thyroid cartilage with complete approximation of arytenoids to epiglottic petiole Anterior hyoid excursion: Complete anterior movement Epiglottic movement: Complete inversion Laryngeal vestibule closure: Incomplete, narrow column air/contrast in laryngeal vestibule Pharyngeal stripping wave : Present - complete Pharyngoesophageal segment opening: -- (unable to visualize due to positioning) Tongue base retraction: Trace column of contrast or air between tongue base and PPW Pharyngeal residue: Complete pharyngeal clearance  Esophageal Impairment Domain: No data recorded Pill: No data recorded Penetration/Aspiration Scale Score: Penetration/Aspiration Scale Score 2.  Material enters airway, remains ABOVE vocal cords then ejected out: Thin liquids (Level 0) Compensatory Strategies: Compensatory Strategies Compensatory strategies: No   General Information:  Caregiver present: No  Diet Prior to this Study: NPO   Temperature : Normal   Respiratory Status: WFL   Supplemental O2: None (Room air)   History of Recent Intubation: No  Behavior/Cognition: Alert; Cooperative Self-Feeding Abilities: Dependent for feeding Baseline vocal quality/speech: Normal Volitional Cough: -- (unable to test) Volitional Swallow: Able to elicit Exam Limitations: Poor positioning; Limited visibility; Poor participation; Poor bolus acceptance Goal Planning: Prognosis for improved oropharyngeal function: Good Barriers to Reach Goals: Cognitive deficits No data recorded Patient/Family Stated Goal: none stated, no family present No data recorded Pain: Pain Assessment Pain Assessment: Faces Faces Pain Scale: 10 Breathing: 0 Negative Vocalization: 0 Facial Expression: 1 Body Language: 2 Consolability: 1 PAINAD Score: 4 Pain Location: back End of Session: Start Time:SLP Start Time (ACUTE ONLY): 0855 Stop Time: SLP Stop Time (ACUTE ONLY): 0910 Time Calculation:SLP Time Calculation (min) (ACUTE ONLY): 15 min Charges: SLP Evaluations $ SLP Speech Visit: 1 Visit SLP Evaluations $MBS Swallow: 1 Procedure $Swallowing Treatment: 1 Procedure SLP visit diagnosis: SLP Visit Diagnosis: Dysphagia, oropharyngeal phase (R13.12) Past Medical History: Past Medical History: Diagnosis Date  Anxiety   Arthritis   Asthma   Bipolar disorder (HCC)   Current every day smoker   Depression   Diabetes mellitus, type II (HCC)   Dyspnea   History of kidney stones   History of pneumonia   Hyperlipidemia   Hypertension   Morbid obesity (HCC)   Sedentary lifestyle   Seizures (HCC)   last seizure 12 yrs ago, no current problem  Sleep apnea   uses CPAP nightly Past Surgical History: Past Surgical History: Procedure Laterality Date  CARDIAC CATHETERIZATION  2020  carpal tunel Left   COLONOSCOPY    HARDWARE REMOVAL Left 07/26/2022  Procedure: HARDWARE REMOVAL ELBOW;  Surgeon: [name], MD;  Location: [facility] OR;  Service: Orthopedics;   Laterality: Left;  IR FLUORO GUIDE CV LINE RIGHT  08/07/2023  IR FLUORO GUIDE CV LINE RIGHT  09/01/2023  IR GASTROSTOMY TUBE MOD SED  08/14/2023  IR REMOVAL TUN CV CATH W/O FL  08/29/2023  IR US  GUIDE VASC ACCESS RIGHT  08/07/2023  IR US  GUIDE VASC ACCESS RIGHT  09/01/2023  ORIF HUMERUS FRACTURE Left 01/16/2022  Procedure: OPEN REDUCTION INTERNAL FIXATION (ORIF) DISTAL HUMERUS FRACTURE;  Surgeon: [name], MD;  Location: [facility] OR;  Service: Orthopedics;  Laterality: Left;  ORIF HUMERUS FRACTURE Left 01/29/2023  Procedure: OPEN REDUCTION INTERNAL FIXATION (ORIF) DISTAL HUMERUS FRACTURE;  Surgeon: [name], MD;  Location: [facility] OR;  Service: Orthopedics;  Laterality: Left;  OTHER SURGICAL HISTORY    R & L shoulder  OTHER SURGICAL HISTORY Right   Knee surgery x several  TOTAL HIP ARTHROPLASTY Left 12/26/2017  Procedure: LEFT TOTAL HIP ARTHROPLASTY ANTERIOR APPROACH;  Surgeon: Vernetta Lonni GRADE, MD;  Location: [facility] ORS;  Service: Orthopedics;  Laterality: Left;  TOTAL KNEE ARTHROPLASTY Left 11/23/2021  Procedure: LEFT TOTAL KNEE ARTHROPLASTY;  Surgeon: Vernetta Lonni GRADE, MD;  Location: [facility] OR[facility];  Service: Orthopedics;  Laterality: Left; Rosina DELENA Downy 09/08/2023, 1:14 PM  IR Fluoro Guide CV Line Right Result Date: 09/01/2023 CLINICAL DATA:  End-stage renal disease in need for new tunneled hemodialysis catheter. Status post removal previously placed catheter on 08/29/2023 due to bacteremia. EXAM: TUNNELED CENTRAL VENOUS HEMODIALYSIS CATHETER PLACEMENT WITH ULTRASOUND AND FLUOROSCOPIC GUIDANCE ANESTHESIA/SEDATION: Moderate (conscious) sedation was employed during this procedure. A total of Versed  1.5 mg and Fentanyl  50 mcg was administered intravenously. Moderate Sedation Time: 24 minutes. The patient's level of consciousness and vital signs were monitored continuously by radiology nursing throughout the procedure under my direct supervision. MEDICATIONS: 2 g IV Ancef . FLUOROSCOPY: Radiation Exposure Index:  2.1 mGy Kerma PROCEDURE: The procedure, risks, benefits, and alternatives were explained to the patient. Questions regarding the procedure were encouraged and answered. The patient understands and consents to the procedure. A timeout was performed prior to initiating the procedure. The right neck and chest were prepped with chlorhexidine  in a sterile fashion, and a sterile drape was applied covering the operative field. Maximum barrier sterile technique with sterile gowns and gloves were used for the procedure. Local anesthesia was provided with 1% lidocaine . Ultrasound was performed to confirm patency of the right internal jugular vein. An ultrasound image was saved and recorded. After creating a small venotomy incision, a 21 gauge needle was advanced into the right internal jugular vein under direct, real-time ultrasound guidance. After securing guidewire access, an 8 Fr dilator was placed. A J-wire was kinked to measure appropriate catheter length. A Palindrome tunneled hemodialysis catheter measuring 19 cm from tip to cuff was chosen for placement. This was tunneled in a retrograde fashion from the chest wall to the venotomy incision. At the venotomy, serial dilatation was performed and a 15 Fr peel-away sheath was placed over a guidewire. The catheter was then placed through the sheath and the sheath removed. Final catheter positioning was confirmed and documented with a fluoroscopic spot image. The catheter was aspirated, flushed with saline, and injected with appropriate volume heparin  dwells. The venotomy incision was closed with subcutaneous and subcuticular 4-0 Vicryl. Dermabond was applied to the incision. The catheter exit site was secured with 0-Prolene retention sutures. COMPLICATIONS: None.  No pneumothorax. FINDINGS: After catheter placement, the tip lies in the right atrium. The catheter aspirates normally and is ready for immediate use. IMPRESSION: Placement of tunneled hemodialysis catheter via  the right internal jugular vein. The catheter tip lies in the right atrium. The catheter is ready for immediate use. Electronically Signed  By: Marcey Moan M.D.   On: 09/01/2023 12:16   IR US  Guide Vasc Access Right Result Date: 09/01/2023 CLINICAL DATA:  End-stage renal disease in need for new tunneled hemodialysis catheter. Status post removal previously placed catheter on 08/29/2023 due to bacteremia. EXAM: TUNNELED CENTRAL VENOUS HEMODIALYSIS CATHETER PLACEMENT WITH ULTRASOUND AND FLUOROSCOPIC GUIDANCE ANESTHESIA/SEDATION: Moderate (conscious) sedation was employed during this procedure. A total of Versed  1.5 mg and Fentanyl  50 mcg was administered intravenously. Moderate Sedation Time: 24 minutes. The patient's level of consciousness and vital signs were monitored continuously by radiology nursing throughout the procedure under my direct supervision. MEDICATIONS: 2 g IV Ancef . FLUOROSCOPY: Radiation Exposure Index: 2.1 mGy Kerma PROCEDURE: The procedure, risks, benefits, and alternatives were explained to the patient. Questions regarding the procedure were encouraged and answered. The patient understands and consents to the procedure. A timeout was performed prior to initiating the procedure. The right neck and chest were prepped with chlorhexidine  in a sterile fashion, and a sterile drape was applied covering the operative field. Maximum barrier sterile technique with sterile gowns and gloves were used for the procedure. Local anesthesia was provided with 1% lidocaine . Ultrasound was performed to confirm patency of the right internal jugular vein. An ultrasound image was saved and recorded. After creating a small venotomy incision, a 21 gauge needle was advanced into the right internal jugular vein under direct, real-time ultrasound guidance. After securing guidewire access, an 8 Fr dilator was placed. A J-wire was kinked to measure appropriate catheter length. A Palindrome tunneled hemodialysis  catheter measuring 19 cm from tip to cuff was chosen for placement. This was tunneled in a retrograde fashion from the chest wall to the venotomy incision. At the venotomy, serial dilatation was performed and a 15 Fr peel-away sheath was placed over a guidewire. The catheter was then placed through the sheath and the sheath removed. Final catheter positioning was confirmed and documented with a fluoroscopic spot image. The catheter was aspirated, flushed with saline, and injected with appropriate volume heparin  dwells. The venotomy incision was closed with subcutaneous and subcuticular 4-0 Vicryl. Dermabond was applied to the incision. The catheter exit site was secured with 0-Prolene retention sutures. COMPLICATIONS: None.  No pneumothorax. FINDINGS: After catheter placement, the tip lies in the right atrium. The catheter aspirates normally and is ready for immediate use. IMPRESSION: Placement of tunneled hemodialysis catheter via the right internal jugular vein. The catheter tip lies in the right atrium. The catheter is ready for immediate use. Electronically Signed   By: Marcey Moan M.D.   On: 09/01/2023 12:16   DG Humerus Left Result Date: 09/01/2023 CLINICAL DATA:  Fracture.  Status post ORIF. EXAM: LEFT HUMERUS - 2+ VIEW COMPARISON:  08/05/2023 FINDINGS: Similar markedly abnormal appearance to the distal humerus status post cortical plate and screw fixation. Study is limited by positioning and as before, bony anatomy is markedly demineralized with possible fragmentation of the distal humerus at the elbow in areas of substantial lucency. IMPRESSION: Limited assessment shows no substantial interval change in fixation hardware although direct comparison between the two studies is difficult due to differential positioning and markedly abnormal appearance of the distal humerus. As noted on the previous exam, given the marked loss of mineralized bone, infection is not excluded. Electronically Signed   By:  Camellia Candle M.D.   On: 09/01/2023 12:05   IR Removal Tun Cv Cath W/O FL Result Date: 08/29/2023 INDICATION: Bacteremia with request for removal of tunneled catheter for line holiday. EXAM: REMOVAL  OF TUNNELED HEMODIALYSIS CATHETER MEDICATIONS: None COMPLICATIONS: None immediate. PROCEDURE: Informed written consent was obtained from the patient following an explanation of the procedure, risks, benefits and alternatives to treatment. A time out was performed prior to the initiation of the procedure. Alcohol  was used to prep the patient's right neck, chest and existing catheter (patient has allergy to ChloraPrep and Betadine ). Using only gentle traction the catheter was removed intact. Hemostasis was obtained with manual compression. A dressing was placed. The patient tolerated the procedure well without immediate post procedural complication. IMPRESSION: Successful removal of tunneled dialysis catheter. Procedure performed by: Sari Lamp, PA-C Electronically Signed   By: Marcey Moan M.D.   On: 08/29/2023 16:11   DG Swallowing Func-Speech Pathology Result Date: 08/25/2023 Table formatting from the original result was not included. Images from the original result were not included. Modified Barium Swallow Study Patient Details Name: Meryl Ponder. MRN: [id number] Date of Birth: May 22, 1965 Today's Date: 08/25/2023 HPI/PMH: HPI: 58 y.o. male transferred from [facility] 07/03/23 to [facility] for management of newly diagnosed plasma cell myeloma with weakness and AMS. Pt also with AKI and metabolic encephalopathy. 5/10 transfer to [facility] for iHD.Bedside swallow eval 4/18 wiht recs for NPO due to somnolence. 4/22 Intubated and chemo initiated. 4/23-5/10 CRRT. 5/6 IVIG started. 5/8 trach. 5/12 iHD initiated. 5/18 inability to oxygenate through trach, intubated and trach revision. PMHx:Lt THA, carpal tunnel syndrome, Lt TKA, Lt humerus fx, PAF, HTN, HLD, bipolar disorder, obesity, T2DM Clinical Impression: Pt  demonstrates a moderate oropharyngeal dysphagia with a DIGEST score of 2. Pt evaluated with PMSV in place, pt able to follow about 50% of commands to cough or throat clear, requires total assisted feeding. Is not initiating well, very flat affect. Pt initiates swallows in the pyriform sinuses with spillage of bolus into the vestible with sensed aspiration of thin liquids and silent aspiraiton of nectar thick liquids in only the final attempt. There is base of tongue and vallecular residue that penetrated the vestibule without pt initiation to clear.  Suspect base of tongue weakness, but also noted very bumpy irregular tissue on the velar tissue that would be contributing to residue (see screen shot below). He does respond occasionally  to verbal cues for a throat clear and second swallow. Pt to remain NPO, but would benefit from ice chips throughout the day and frequent trials of nectar thick liquids after oral care with SLP. DIGEST Swallow Severity Rating*  Safety: 2  Efficiency:2  Overall Pharyngeal Swallow Severity: 2 1: mild; 2: moderate; 3: severe; 4: profound *The Dynamic Imaging Grade of Swallowing Toxicity is standardized for the head and neck cancer population, however, demonstrates promising clinical applications across populations to standardize the clinical rating of pharyngeal swallow safety and severity. Factors that may increase risk of adverse event in presence of aspiration Noe & Lianne 2021): Factors that may increase risk of adverse event in presence of aspiration Noe & Lianne 2021): Reduced cognitive function; Frail or deconditioned; Dependence for feeding and/or oral hygiene; Inadequate oral hygiene; Weak cough; Limited mobility; Poor general health and/or compromised immunity Recommendations/Plan: Swallowing Evaluation Recommendations Swallowing Evaluation Recommendations Recommendations: Ice chips PRN after oral care Medication Administration: Via alternative means Oral care  recommendations: Oral care before ice chips/water  Treatment Plan Treatment Plan Treatment recommendations: Therapy as outlined in treatment plan below Recommendations Recommendations for follow up therapy are one component of a multi-disciplinary discharge planning process, led by the attending physician.  Recommendations may be updated based on patient status, additional  functional criteria and insurance authorization. Assessment: Orofacial Exam: Orofacial Exam Oral Cavity - Dentition: Poor condition; Missing dentition Anatomy: Anatomy: WFL Boluses Administered: Boluses Administered Boluses Administered: Mildly thick liquids (Level 2, nectar thick); Thin liquids (Level 0); Moderately thick liquids (Level 3, honey thick); Puree  Oral Impairment Domain: Oral Impairment Domain Lip Closure: No labial escape Tongue control during bolus hold: Not tested Bolus preparation/mastication: -- (NT) Bolus transport/lingual motion: Repetitive/disorganized tongue motion Oral residue: Trace residue lining oral structures Location of oral residue : Tongue Initiation of pharyngeal swallow : Pyriform sinuses  Pharyngeal Impairment Domain: Pharyngeal Impairment Domain Soft palate elevation: No bolus between soft palate (SP)/pharyngeal wall (PW) Laryngeal elevation: Complete superior movement of thyroid cartilage with complete approximation of arytenoids to epiglottic petiole Anterior hyoid excursion: Complete anterior movement Epiglottic movement: Complete inversion Laryngeal vestibule closure: Incomplete, narrow column air/contrast in laryngeal vestibule Pharyngeal stripping wave : Present - complete Pharyngoesophageal segment opening: Complete distension and complete duration, no obstruction of flow Tongue base retraction: Trace column of contrast or air between tongue base and PPW Pharyngeal residue: Collection of residue within or on pharyngeal structures Location of pharyngeal residue: Tongue base; Valleculae; Pyriform sinuses   Esophageal Impairment Domain: No data recorded Pill: No data recorded Penetration/Aspiration Scale Score: Penetration/Aspiration Scale Score 1.  Material does not enter airway: Mildly thick liquids (Level 2, nectar thick) 3.  Material enters airway, remains ABOVE vocal cords and not ejected out: Moderately thick liquids (Level 3, honey thick); Mildly thick liquids (Level 2, nectar thick); Puree 8.  Material enters airway, passes BELOW cords without attempt by patient to eject out (silent aspiration) : Mildly thick liquids (Level 2, nectar thick) Compensatory Strategies: No data recorded  General Information: Caregiver present: No  Diet Prior to this Study: NPO; Cortrak/Small bore NG tube   Temperature : Normal   Respiratory Status: WFL   Supplemental O2: Trach Collar   History of Recent Intubation: No  Behavior/Cognition: Requires cueing; Alert Self-Feeding Abilities: Dependent for feeding Baseline vocal quality/speech: Not observed Volitional Cough: Able to elicit Volitional Swallow: Able to elicit No data recorded Goal Planning: Prognosis for improved oropharyngeal function: Good Barriers to Reach Goals: Cognitive deficits No data recorded Patient/Family Stated Goal: not able to state No data recorded Pain: Pain Assessment Breathing: 0 Negative Vocalization: 0 Facial Expression: 0 Body Language: 0 Consolability: 0 PAINAD Score: 0 End of Session: Start Time:SLP Start Time (ACUTE ONLY): 1400 Stop Time: SLP Stop Time (ACUTE ONLY): 1415 Time Calculation:SLP Time Calculation (min) (ACUTE ONLY): 15 min Charges: SLP Evaluations $ SLP Speech Visit: 1 Visit SLP Evaluations $MBS Swallow: 1 Procedure $Swallowing Treatment: 1 Procedure $Speech Treatment for Individual: 1 Procedure SLP visit diagnosis: SLP Visit Diagnosis: Dysphagia, oropharyngeal phase (R13.12) Past Medical History: Past Medical History: Diagnosis Date  Anxiety   Arthritis   Asthma   Bipolar disorder (HCC)   Current every day smoker   Depression   Diabetes  mellitus, type II (HCC)   Dyspnea   History of kidney stones   History of pneumonia   Hyperlipidemia   Hypertension   Morbid obesity (HCC)   Sedentary lifestyle   Seizures (HCC)   last seizure 12 yrs ago, no current problem  Sleep apnea   uses CPAP nightly Past Surgical History: Past Surgical History: Procedure Laterality Date  CARDIAC CATHETERIZATION  2020  carpal tunel Left   COLONOSCOPY    HARDWARE REMOVAL Left 07/26/2022  Procedure: HARDWARE REMOVAL ELBOW;  Surgeon: [name], MD;  Location: [facility] OR;  Service: Orthopedics;  Laterality: Left;  IR FLUORO GUIDE CV LINE RIGHT  08/07/2023  IR GASTROSTOMY TUBE MOD SED  08/14/2023  IR US  GUIDE VASC ACCESS RIGHT  08/07/2023  ORIF HUMERUS FRACTURE Left 01/16/2022  Procedure: OPEN REDUCTION INTERNAL FIXATION (ORIF) DISTAL HUMERUS FRACTURE;  Surgeon: [name], MD;  Location: [facility] OR;  Service: Orthopedics;  Laterality: Left;  ORIF HUMERUS FRACTURE Left 01/29/2023  Procedure: OPEN REDUCTION INTERNAL FIXATION (ORIF) DISTAL HUMERUS FRACTURE;  Surgeon: [name], MD;  Location: [facility] OR;  Service: Orthopedics;  Laterality: Left;  OTHER SURGICAL HISTORY    R & L shoulder  OTHER SURGICAL HISTORY Right   Knee surgery x several  TOTAL HIP ARTHROPLASTY Left 12/26/2017  Procedure: LEFT TOTAL HIP ARTHROPLASTY ANTERIOR APPROACH;  Surgeon: Vernetta Lonni GRADE, MD;  Location: [facility]S;  Service: Orthopedics;  Laterality: Left;  TOTAL KNEE ARTHROPLASTY Left 11/23/2021  Procedure: LEFT TOTAL KNEE ARTHROPLASTY;  Surgeon: Vernetta Lonni GRADE, MD;  Location: [facility];  Service: Orthopedics;  Laterality: Left; DeBlois, Consuelo Fitch 08/25/2023, 3:02 PM

## 2023-09-23 ENCOUNTER — Inpatient Hospital Stay (HOSPITAL_COMMUNITY)

## 2023-09-23 ENCOUNTER — Inpatient Hospital Stay: Payer: Self-pay | Admitting: Internal Medicine

## 2023-09-23 DIAGNOSIS — A419 Sepsis, unspecified organism: Secondary | ICD-10-CM | POA: Diagnosis not present

## 2023-09-23 DIAGNOSIS — R6521 Severe sepsis with septic shock: Secondary | ICD-10-CM | POA: Diagnosis not present

## 2023-09-23 LAB — COMPREHENSIVE METABOLIC PANEL WITH GFR
ALT: 8 U/L (ref 0–44)
AST: 11 U/L — ABNORMAL LOW (ref 15–41)
Albumin: 2 g/dL — ABNORMAL LOW (ref 3.5–5.0)
Alkaline Phosphatase: 93 U/L (ref 38–126)
Anion gap: 9 (ref 5–15)
BUN: 43 mg/dL — ABNORMAL HIGH (ref 6–20)
CO2: 21 mmol/L — ABNORMAL LOW (ref 22–32)
Calcium: 8.2 mg/dL — ABNORMAL LOW (ref 8.9–10.3)
Chloride: 102 mmol/L (ref 98–111)
Creatinine, Ser: 0.94 mg/dL (ref 0.61–1.24)
GFR, Estimated: >60 mL/min (ref >60)
Glucose, Bld: 143 mg/dL — ABNORMAL HIGH (ref 70–99)
Potassium: 4 mmol/L (ref 3.5–5.1)
Sodium: 132 mmol/L — ABNORMAL LOW (ref 135–145)
Total Bilirubin: 0.4 mg/dL (ref 0.0–1.2)
Total Protein: 5.2 g/dL — ABNORMAL LOW (ref 6.5–8.1)

## 2023-09-23 LAB — RENAL FUNCTION PANEL
Albumin: 2 g/dL — ABNORMAL LOW (ref 3.5–5.0)
Anion gap: 6 (ref 5–15)
BUN: 45 mg/dL — ABNORMAL HIGH (ref 6–20)
CO2: 25 mmol/L (ref 22–32)
Calcium: 8.3 mg/dL — ABNORMAL LOW (ref 8.9–10.3)
Chloride: 102 mmol/L (ref 98–111)
Creatinine, Ser: 0.94 mg/dL (ref 0.61–1.24)
GFR, Estimated: >60 mL/min (ref >60)
Glucose, Bld: 141 mg/dL — ABNORMAL HIGH (ref 70–99)
Phosphorus: 4.2 mg/dL (ref 2.5–4.6)
Potassium: 4.1 mmol/L (ref 3.5–5.1)
Sodium: 133 mmol/L — ABNORMAL LOW (ref 135–145)

## 2023-09-23 LAB — MAGNESIUM: Magnesium: 2 mg/dL (ref 1.7–2.4)

## 2023-09-23 LAB — CBC WITH DIFFERENTIAL/PLATELET
Abs Immature Granulocytes: 0.01 K/uL (ref 0.00–0.07)
Basophils Absolute: 0 K/uL (ref 0.0–0.1)
Basophils Relative: 1 %
Eosinophils Absolute: 0 K/uL (ref 0.0–0.5)
Eosinophils Relative: 0 %
HCT: 25.9 % — ABNORMAL LOW (ref 39.0–52.0)
Hemoglobin: 8.5 g/dL — ABNORMAL LOW (ref 13.0–17.0)
Immature Granulocytes: 0 %
Lymphocytes Relative: 18 %
Lymphs Abs: 0.8 K/uL (ref 0.7–4.0)
MCH: 35.4 pg — ABNORMAL HIGH (ref 26.0–34.0)
MCHC: 32.8 g/dL (ref 30.0–36.0)
MCV: 107.9 fL — ABNORMAL HIGH (ref 80.0–100.0)
Monocytes Absolute: 1 K/uL (ref 0.1–1.0)
Monocytes Relative: 23 %
Neutro Abs: 2.6 K/uL (ref 1.7–7.7)
Neutrophils Relative %: 58 %
Platelets: 275 K/uL (ref 150–400)
RBC: 2.4 MIL/uL — ABNORMAL LOW (ref 4.22–5.81)
RDW: 21.2 % — ABNORMAL HIGH (ref 11.5–15.5)
Smear Review: NORMAL
WBC: 4.5 K/uL (ref 4.0–10.5)
nRBC: 0 % (ref 0.0–0.2)

## 2023-09-23 LAB — GLUCOSE, CAPILLARY
Glucose-Capillary: 95 mg/dL (ref 70–99)
Glucose-Capillary: 146 mg/dL — ABNORMAL HIGH (ref 70–99)
Glucose-Capillary: 118 mg/dL — ABNORMAL HIGH (ref 70–99)
Glucose-Capillary: 131 mg/dL — ABNORMAL HIGH (ref 70–99)

## 2023-09-23 MED ORDER — FREE WATER
50.0000 mL | Freq: Every day | Status: DC
  Administered 2023-09-23 – 2023-09-30 (×8): 50 mL

## 2023-09-23 MED ORDER — COLLAGENASE 250 UNIT/GM EX OINT
TOPICAL_OINTMENT | Freq: Every day | CUTANEOUS | Status: DC
  Filled 2023-09-23: qty 30

## 2023-09-23 MED ORDER — MELATONIN 3 MG PO TABS
3.0000 mg | ORAL_TABLET | Freq: Once | ORAL | Status: AC | PRN
Start: 2023-09-23 — End: 2023-09-24
  Administered 2023-09-24: 3 mg via ORAL
  Filled 2023-09-23: qty 1

## 2023-09-23 MED ORDER — LORAZEPAM 2 MG/ML IJ SOLN
1.0000 mg | Freq: Once | INTRAMUSCULAR | Status: AC
  Administered 2023-09-23: 1 mg via INTRAVENOUS
  Filled 2023-09-23: qty 1

## 2023-09-23 MED ORDER — ENSURE PLUS HIGH PROTEIN PO LIQD
237.0000 mL | Freq: Two times a day (BID) | ORAL | Status: DC
  Administered 2023-09-23 – 2023-11-07 (×72): 237 mL via ORAL

## 2023-09-23 NOTE — Consult Note (Signed)
 WOC Nurse wound follow up Wound type: Unstageable sacrum  R buttocks stage 3 L buttocks stage 2 Measurement: Unstageable sacrum 5 cm x 3 cm x 0.2 R Buttocks stage 3: 3 cm x 2 cm  L buttocks stage 2: 0.5 cm x 0.5 cm  Wound bed: 60% covered in yellow slough 40 % pink moist L Buttocks stage 3: pink moist with scattered adipose tissue and granulation tissue R buttocks stage 2: pink moist Drainage (amount, consistency, odor)  Unstageable sacrum: purulent  R Buttocks stage 3:  serosanguineous  L buttocks stage 2: serosanguineous Periwound: macerated  Dressing procedure/placement/frequency: Sacrum: Cleanse wound with NS allow to air dry, apply Santyl  to wound bed, cover with saline moistened gauze, cover with foam  change daily and PRN soiling.  R buttocks/ L buttocks: cleanse wound with Vashe Soila # [phone]) allow to air dry and cover with foam dressing, lift dressing daily to view wound and cleanse and change foam dressing Q 3 days and PRN soiling.  WOC Nurse team will follow with you and see patient within 10 days for wound assessments.  Please notify WOC nurses of any acute changes in the wounds or any new areas of concern.  Doyal Polite, RN, MSN, [facility] WOC Team

## 2023-09-23 NOTE — Plan of Care (Signed)
 Down for MRI and then requested medicine. Medicine given then he declined MRI. Wife notified and asked can it be performed at another time. OT in to perform dressing change on sacral area with changes to dressing order( santyl , guaze and ABD dressing).      Problem: Education: Goal: Knowledge of General Education information will improve Description: Including pain rating scale, medication(s)/side effects and non-pharmacologic comfort measures Outcome: Progressing   Problem: Activity: Goal: Risk for activity intolerance will decrease Outcome: Progressing   Problem: Nutrition: Goal: Adequate nutrition will be maintained Outcome: Progressing   Problem: Coping: Goal: Level of anxiety will decrease Outcome: Progressing   Problem: Pain Managment: Goal: General experience of comfort will improve and/or be controlled Outcome: Progressing   Problem: Skin Integrity: Goal: Risk for impaired skin integrity will decrease Outcome: Progressing   Problem: Safety: Goal: Ability to remain free from injury will improve Outcome: Progressing

## 2023-09-23 NOTE — Progress Notes (Signed)
 Physical Therapy Treatment Patient Details Name: Frank [name]. MRN: [id number] DOB: 03/21/1965 Today's Date: 09/23/2023   History of Present Illness 58 y.o. male transferred from [facility] 07/03/23 to [facility] for management of newly diagnosed plasma cell myeloma with weakness and AMS. Pt also with AKI and metabolic encephalopathy. 5/10 transfer to [facility] for iHD. 4/22 Intubated and chemo initiated. 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 5/6 IVIG started. 5/8-6/11 trach. 5/12 iHD initiated. 5/29 PEG placed. 6/5 chemo initiated. PMHx:Lt THA, carpal tunnel syndrome, Lt TKA, Lt humerus fx, PAF, HTN, HLD, bipolar disorder, obesity, T2DM    PT Comments  [facility] PT/OT Session: Pt eager to work with therapy today. Feels much better with L UE in sling, and is able to concentrate on functional stands. Unfortunately, pt continues to have nerve pain in his R LE, pt reports pain in foot and ankle is gone, however from ankle up is extremely painful with weight bearing. Pt continues to be maxAx2 for bed mobility and standing. Despite this pt is pleased with progress as he is was able to stand in [place] x3 today. D/c plans remain appropriate at this time. PT will continue to follow acutely.    If plan is discharge home, recommend the following: Two people to help with walking and/or transfers;Assistance with cooking/housework;Assist for transportation;Two people to help with bathing/dressing/bathroom;Direct supervision/assist for medications management;Direct supervision/assist for financial management;Help with stairs or ramp for entrance;Assistance with feeding   Can travel by private vehicle     No  Equipment Recommendations  Hospital bed;Hoyer lift;Wheelchair (measurements PT);Wheelchair cushion (measurements PT);BSC/3in1       Precautions / Restrictions Precautions Precautions: Fall Recall of Precautions/Restrictions: Impaired Precaution/Restrictions Comments: episode of syncope, sacral wound, chemo. Lt humerus  non-union, splinted. no elbow ROM, but shoulder ROM ok per secure chat with Dr. Jonel 7/1 Required Braces or Orthoses: Sling Restrictions Weight Bearing Restrictions Per Provider Order: Yes LUE Weight Bearing Per Provider Order: Non weight bearing Other Position/Activity Restrictions: Lt humerus non-union, splinted. no elbow ROM, but shoulder ROM ok per secure chat with Dr. Jonel 7/1     Mobility  Bed Mobility Overal bed mobility: Needs Assistance Bed Mobility: Rolling, Supine to Sit, Sit to Sidelying Rolling: Max assist   Supine to sit: Max assist, HOB elevated, Used rails   Sit to sidelying: Max assist, +2 for physical assistance General bed mobility comments: increased time and assistance of one to get to EOB and +2 for sit to sidelying    Transfers Overall transfer level: Needs assistance Equipment used: Ambulation equipment used Transfers: Sit to/from Stand Sit to Stand: Max assist, +2 physical assistance, +2 safety/equipment           General transfer comment: 3 stands performed from EOB with max assist +2 with use of bed pad to assist with lifting hips. Transfer via Lift Equipment: Stedy     Balance Overall balance assessment: Needs assistance Sitting-balance support: Single extremity supported, Feet supported Sitting balance-Leahy Scale: Fair Sitting balance - Comments: CGA to min assist for safety   Standing balance support: Single extremity supported, During functional activity, Reliant on assistive device for balance Standing balance-Leahy Scale: Zero Standing balance comment: stood in [place] x3 with max assist +2 with patient reliant on external support for balance                            Communication Communication Communication: Impaired Factors Affecting Communication: Reduced clarity of speech  Cognition  Arousal: Alert Behavior During Therapy: WFL for tasks assessed/performed                             Following  commands: Impaired Following commands impaired: Follows one step commands with increased time    Cueing Cueing Techniques: Verbal cues, Tactile cues, Visual cues     General Comments General comments (skin integrity, edema, etc.): education provided on STAR program and patient is eager to participate      Pertinent Vitals/Pain Pain Assessment Pain Assessment: Faces Faces Pain Scale: Hurts whole lot Pain Location: RLE knee and LUE shoulder Pain Descriptors / Indicators: Sore, Discomfort, Grimacing, Numbness Pain Intervention(s): Limited activity within patient's tolerance, Monitored during session, Repositioned     PT Goals (current goals can now be found in the care plan section) Acute Rehab PT Goals PT Goal Formulation: With patient Time For Goal Achievement: 09/25/23 Potential to Achieve Goals: Fair Progress towards PT goals: Progressing toward goals    Frequency    Min 1X/week           Co-evaluation PT/OT/SLP Co-Evaluation/Treatment: Yes Reason for Co-Treatment: For patient/therapist safety;To address functional/ADL transfers PT goals addressed during session: Mobility/safety with mobility;Balance OT goals addressed during session: ADL's and self-care      AM-PAC PT 6 Clicks Mobility   Outcome Measure  Help needed turning from your back to your side while in a flat bed without using bedrails?: A Lot Help needed moving from lying on your back to sitting on the side of a flat bed without using bedrails?: Total Help needed moving to and from a bed to a chair (including a wheelchair)?: Total Help needed standing up from a chair using your arms (e.g., wheelchair or bedside chair)?: Total Help needed to walk in hospital room?: Total Help needed climbing 3-5 steps with a railing? : Total 6 Click Score: 7    End of Session Equipment Utilized During Treatment: Gait belt Activity Tolerance: Patient tolerated treatment well Patient left: in bed;with call bell/phone  within reach Nurse Communication: Mobility status PT Visit Diagnosis: Other abnormalities of gait and mobility (R26.89);Muscle weakness (generalized) (M62.81);Difficulty in walking, not elsewhere classified (R26.2);Unsteadiness on feet (R26.81)     Time: 9064-8986 PT Time Calculation (min) (ACUTE ONLY): 38 min  Charges:    $Therapeutic Activity: 8-22 mins PT General Charges $$ ACUTE PT VISIT: 1 Visit                     Syrianna Schillaci B. Fleeta Lapidus PT, DPT Acute Rehabilitation Services Please use secure chat or  Call Office [phone]    [name] [facility] 09/23/2023, 3:48 PM

## 2023-09-23 NOTE — TOC Progression Note (Signed)
 Transition of Care (TOC) - Progression Note    Patient Details  Name: Ruford Dudzinski. MRN: [id number] Date of Birth: 08/15/1965  Transition of [facility]) CM/SW Contact  Almarie CHRISTELLA Goodie, [place] Phone Number: 09/23/2023, 4:06 PM  Clinical Narrative:   Patient picked up by STAR program to work towards chair tolerance for discharge plans. Noting patient will also be scheduled for arm amputation. CSW spoke with patient's spouse, [name], who is appreciative that the patient will finally have the arm dealt with, as it has been causing him a lot of issues. [name] expressed some concerns regarding patient's care and CSW relayed information to Health visitor. CSW to continue to follow.    Expected Discharge Plan: Skilled Nursing Facility Barriers to Discharge: Continued Medical Work up  Expected Discharge Plan and Services In-house Referral: Clinical Social Work Discharge Planning Services: CM Consult Post Acute Care Choice: Skilled Nursing Facility Living arrangements for the past 2 months: Single Family Home                 DME Arranged: N/A DME Agency: NA       HH Arranged: NA HH Agency: NA         Social Determinants of Health (SDOH) Interventions SDOH Screenings   Food Insecurity: No Food Insecurity (07/05/2023)  Housing: Low Risk  (07/05/2023)  Transportation Needs: No Transportation Needs (07/05/2023)  Utilities: Not At Risk (07/05/2023)  Tobacco Use: High Risk (08/30/2023)    Readmission Risk Interventions     No data to display

## 2023-09-23 NOTE — Plan of Care (Signed)

## 2023-09-23 NOTE — Progress Notes (Signed)
 PROGRESS NOTE  Frank [name]. FMW:[id number] DOB: [birth year] DOA: 07/03/2023 PCP: [name], MD   LOS: 82 days   Brief Narrative / Interim history: 58 year old PMH OSA CPAP, pathological fracture and multiple ORIF left Humerus likely due to multiple myeloma, A-fib not on anticoagulation, hyperlipidemia, tobacco use disorder who was  feeling weak for 1 week, admitted  to [facility] health with concern of sepsis, community-acquired pneumonia and acute encephalopathy.  Patient was found to have lytic lesions,  he was transferred to [facility] for  oncology treatment.  In the meantime he developed encephalopathy, acute renal failure.  He remained in the ICU for 36 days.  Underwent tracheostomy. He was also started on HD after  CRRT.  Subsequently PEG tube placed 5/29.   Significant events 4/17-Admitted to medical floor with oncology consultation to treat multiple myeloma. 4/19-transferred to ICU intubated, encephalopathic. 4/20-A-fib with RVR. Started heparin . Groin and biopsy site hematoma requiring multiple transfusions. EEG 4/25 >> moderate to severe encephalopathy without any seizures or epileptiform discharges. 4/29 Received Velcade  and Cytoxan  chemotherapy-intent palliative? 5/2 LP and ETT exchange  5/8 Trach placed. Some post op bleeding improved with thrombi-pad 5/10 Voriconazole  added back based on CT Chest findings 5/13 more alert and able to follow some commands. 5/23, transferred to floor. 5/29-PEG placed 5/31 chemotherapy changed to Sarclisa /Kyprolis /Cytoxan . 6/13 plan to remove HD cathter for holiday line 6/16 new HD catheter placed 6/23 has not required HD for a week, HD catheter removed 6/28 finished first cycle of chemo, next cycle to start in 2 weeks  Subjective / 24h Interval events: No chest pain, no shortness of breath.  No abdominal pain, no nausea or vomiting.  Complains of right leg numbness, started sometimes yesterday but he is not sure when  Assesement and  Plan: Principal Problem:   Severe sepsis with septic shock (HCC) Active Problems:   Aspergillosis, unspecified (HCC)   Multiple myeloma not having achieved remission (HCC)   Lobar pneumonia, unspecified organism (HCC)   Closed fracture of left distal humerus   AKI (acute kidney injury) (HCC)   Acute hypoxemic respiratory failure (HCC)   PAF (paroxysmal atrial fibrillation) (HCC)   Acute metabolic encephalopathy   Protein-calorie malnutrition, severe   Hematoma of right thigh   Seizure disorder (HCC)   DMII (diabetes mellitus, type 2) (HCC)   Pressure injury of skin   CAD (coronary artery disease)   Chronic diastolic CHF (congestive heart failure) (HCC)   CKD (chronic kidney disease), stage IIIa (HCC)   Class 1 obesity   Asthma   Bipolar disorder (HCC)   Febrile neutropenia (HCC)   Generalized weakness   Anemia   Principal problem Recurrent fevers, sepsis, Staph Epi Bacteremia -Hospital course complicated by intermittent fevers, ID consulted and most recently evaluated patient 6/17.  He had a fever and MRSE bacteremia 1/4 bottles, completed a course of vancomycin  with the end date 6/20.  He did receive a line holiday.  Repeat blood culture 6/9 without growth. - He has unstageable sacral ulcer, continue local wound care.  Afebrile, no leukocytosis  Active problems  Aspergillosis with pneumonia -On Voriconazole  per ID, On Acyclovir .  End date for voriconazole  is 8/5, continue  Right leg numbness -patient also weak, but that is more generalized due to being in bed for almost 3 months.  CT scan without acute findings, will obtain MRI since he has persistent symptoms.  Multiple myeloma -oncology following. Started on Velcade  and Cytoxan  by Oncology, transitioned to Kyprolis  then Sarclisa .   -  Sarclisa  per Oncology - Next dose chemo due ~7/14   Goals of care - Unfortunately poor long-term prognosis. Palliative care has discussed with wife previously.  Remain full code on full scope  of care.  Awaiting SNF placement but he needs to be able to transfer better and be able to sit up in the chair for outpatient chemo which he is unable to do currently   Acute Hypoxic Respiratory Failure - Pulmonology following intermittently, trach removed 6/11.  Respiratory status stable   Right Thigh Hematoma - per CT on 08/01/23: 12.1 x 30.2 x 7.6 cm subcutaneous hematoma within the anterior right thigh. 8.4 x 10.4 x 22.2 cm subcutaneous hematoma within the right gluteal region - continue supportive care.  Hemoglobin stable   Severe acute metabolic encephalopathy with hypoactive delirium, seizure disorder - Normal MRI of the brain.  LP negative for meningitis or encephalitis.  EEG negative for seizures but encephalopathy. Home Depakote  discontinued in ICU.   Insulin -dependent diabetes type 2 with hyperglycemia- A1c 6.0, continue with sliding scale insulin .  Hypoglycemia now resolved  CBG (last 3)  Recent Labs    09/22/23 1636 09/22/23 2113 09/23/23 0622  GLUCAP 164* 132* 95    AKI on CKD stage IIIa, Uremia,  Hyponatremia, Hypokalemia -Due to MM, hypercalcemia and ACE inhibitors?  Nephrology consulted, was on hemodialysis but eventually he tolerated being off HD, tunneled catheter discontinued per nephrology.  Renal function is stable   Paroxysmal A-fib - Unable to  anticoagulation due to hematoma.  Rate controlled   Obstructive Sleep Apnea -Trach removed   Anxiety/Bipolar disorder - Continue with Paxil .  Mood appears stable over the last few days   Severe Malnutrition - Continue tube feeding.  He is allowed dysphagia 1 per speech   History of Pathological fracture and multiple ORIF left Humerus 01/2023 -Dr. Jonel discussed with orthopedics on 6/26, options would involve either a substantial reconstruction with antibiotic cement and prolonged, difficult recovery, or above elbow amputation.  [name] recommends more rehabilitation at this time prior to attempting surgery. - Maintain  left arm splint when out of bed - [name] following, since he is not clear when he will discharge likely to have surgery here in couple of weeks.  Date to be determined   Hypokalemia -potassium now normalized.  Repeat pending today.  Will follow-up   Hyponatremia -sodium overall stable, repeat tomorrow    Scheduled Meds:  acyclovir   400 mg Oral BID   collagenase    Topical Daily   enoxaparin  (LOVENOX ) injection  50 mg Subcutaneous Daily   feeding supplement (NEPRO CARB STEADY)  237 mL Per Tube TID PC & HS   feeding supplement (PROSource TF20)  60 mL Per Tube BID   insulin  aspart  0-15 Units Subcutaneous TID AC & HS   insulin  aspart  2 Units Subcutaneous TID AC & HS   insulin  glargine-yfgn  10 Units Subcutaneous Q24H   montelukast   5 mg Oral QHS   multivitamin with minerals  1 tablet Oral Daily   nutrition supplement (JUVEN)  1 packet Oral BID BM   mouth rinse  15 mL Mouth Rinse 4 times per day   PARoxetine   40 mg Oral Daily   polyethylene glycol  17 g Oral BID   senna  1 tablet Oral Daily   sodium chloride  flush  10-40 mL Intracatheter Q12H   umeclidinium-vilanterol  1 puff Inhalation Daily   valproic  acid  750 mg Oral BID   voriconazole   400 mg Oral  Q12H   Continuous Infusions:  sodium chloride  Stopped (08/28/23 1208)   sodium chloride  Stopped (08/28/23 0839)   PRN Meds:.acetaminophen  (TYLENOL ) oral liquid 160 mg/5 mL **OR** acetaminophen , hydrALAZINE , hydrOXYzine , ipratropium-albuterol , ondansetron  (ZOFRAN ) IV, oxyCODONE , artificial tears, sodium chloride  flush  Current Outpatient Medications  Medication Instructions   albuterol  (PROVENTIL  HFA;VENTOLIN  HFA) 108 (90 Base) MCG/ACT inhaler 2 puffs, Inhalation, Every 6 hours PRN   albuterol  (PROVENTIL ) 2.5 mg, Every 2 hours PRN   aspirin  EC 81 mg, Oral, Daily, Swallow whole.   buPROPion (WELLBUTRIN XL) 150 mg, Daily   busPIRone  (BUSPAR ) 15 mg, Oral, Daily at bedtime   celecoxib  (CELEBREX ) 200 mg, Oral, Daily   clonazePAM   (KLONOPIN ) 1 mg, Oral, Once PRN   cyclobenzaprine  (FLEXERIL ) 5 mg, Oral, 3 times daily PRN   divalproex  (DEPAKOTE ) 500 mg, Oral, 2 times daily   EPINEPHrine  (EPI-PEN) 0.3 mg, Intramuscular, As needed   esomeprazole (NEXIUM) 40 mg, Oral, Every morning   Fluticasone -Salmeterol (ADVAIR) 500-50 MCG/DOSE AEPB 2 puffs, Inhalation, Every morning   furosemide  (LASIX ) 40 mg, Oral, Daily PRN   gabapentin  (NEURONTIN ) 300-600 mg, Oral, See admin instructions, 300 mg in the morning, 600 mg at bedtime    hydrALAZINE  (APRESOLINE ) 10 mg, Oral, 2 times daily   hydrochlorothiazide  (HYDRODIURIL ) 25 mg, Oral, Daily   lisinopril  (ZESTRIL ) 40 mg, Oral, Daily   metFORMIN  (GLUCOPHAGE ) 1,000 mg, Oral, 2 times daily with meals   montelukast  (SINGULAIR ) 10 mg, Oral, Daily at bedtime   OVER THE COUNTER MEDICATION 400 mg, Oral, Daily at bedtime, Burdock root    oxyCODONE  (ROXICODONE ) 5 mg, Oral, Every 4 hours PRN   PARoxetine  (PAXIL ) 40 mg, Oral, BH-each morning   spironolactone  (ALDACTONE ) 25 mg, Oral, Daily at bedtime   tamsulosin  (FLOMAX ) 0.4 mg, Oral, Daily at bedtime   testosterone  cypionate (DEPOTESTOSTERONE CYPIONATE) 100 mg, Intramuscular, Every 14 days   verapamil  (CALAN -SR) 120 mg, Oral, Daily   Vitamin D  (Ergocalciferol ) (DRISDOL ) 50,000 Units, Oral, Every Thu    Diet Orders (From admission, onward)     Start     Ordered   09/17/23 1454  DIET - DYS 1 Room service appropriate? No; Fluid consistency: Thin  Diet effective now       Comments: No dairy, clear liquids only  Question Answer Comment  Room service appropriate? No   Fluid consistency: Thin      09/17/23 1453            DVT prophylaxis: Place and maintain sequential compression device Start: 07/14/23 1033   Lab Results  Component Value Date   PLT 275 09/23/2023      Code Status: Full Code  Family Communication: no family at bedside   Status is: Inpatient Remains inpatient appropriate because: severity of illness  Level of  care: Med-Surg  Consultants:  ID Nephrology Oncology  Objective: Vitals:   09/22/23 1930 09/22/23 2352 09/23/23 0328 09/23/23 0723  BP: (!) 150/95 (!) 150/104 (!) 133/93 (!) 145/97  Pulse: 96 94 96 96  Resp: 18 18 18 20   Temp: 98.2 F (36.8 C) 98.1 F (36.7 C) 97.7 F (36.5 C) 99.3 F (37.4 C)  TempSrc: Oral Oral Oral Oral  SpO2: 98% 97% 95% 96%  Weight:      Height:        Intake/Output Summary (Last 24 hours) at 09/23/2023 0953 Last data filed at 09/23/2023 0728 Gross per 24 hour  Intake 614 ml  Output 3400 ml  Net -2786 ml   Wt Readings from Last 3  Encounters:  09/20/23 105 kg  06/05/23 113.7 kg  01/29/23 117.9 kg    Examination:  Constitutional: NAD Eyes: lids and conjunctivae normal, no scleral icterus ENMT: mmm Neck: normal, supple Respiratory: clear to auscultation bilaterally, no wheezing, no crackles. Normal respiratory effort.  Cardiovascular: Regular rate and rhythm, no murmurs / rubs / gallops. No LE edema. Abdomen: soft, no distention, no tenderness. Bowel sounds positive.   Data Reviewed: I have independently reviewed following labs and imaging studies   CBC Recent Labs  Lab 09/16/23 1224 09/17/23 0837 09/19/23 1003 09/20/23 0500 09/22/23 1030 09/23/23 0821  WBC 5.6 3.9* 5.2 3.9* 6.6 4.5  HGB 8.8* 8.4* 8.6* 8.8* 8.7* 8.5*  HCT 26.5* 25.1* 26.4* 25.2* 25.4* 25.9*  PLT 219 209 222 245 266 275  MCV 109.1* 108.2* 107.8* 106.8* 107.6* 107.9*  MCH 36.2* 36.2* 35.1* 37.3* 36.9* 35.4*  MCHC 33.2 33.5 32.6 34.9 34.3 32.8  RDW 24.0* 23.7* 23.3* 22.9* 21.7* 21.2*  LYMPHSABS 0.8  --   --   --  1.6 PENDING  MONOABS 0.6  --   --   --  1.4* PENDING  EOSABS 0.0  --   --   --  0.0 PENDING  BASOSABS 0.0  --   --   --  0.0 PENDING    Recent Labs  Lab 09/17/23 0837 09/18/23 1100 09/19/23 1003 09/20/23 0500 09/21/23 0330  NA 129* 131* 134*  133* 130* 129*  K 4.4 4.4 4.1  4.1 4.4 4.1  CL 96* 99 101  101 99 99  CO2 23 24 23  23 22 22   GLUCOSE  115* 91 135*  134* 87 129*  BUN 64* 59* 54*  55* 54* 56*  CREATININE 0.97 0.98 1.00  1.08 0.95 0.95  CALCIUM  8.3* 8.1* 8.1*  7.9* 8.2* 8.4*  AST  --   --  15  --   --   ALT  --   --  10  --   --   ALKPHOS  --   --  106  --   --   BILITOT  --   --  0.5  --   --   ALBUMIN  2.0* 2.0* 2.1*  2.1* 2.1* 2.0*  MG  --   --  2.2 2.2  --     ------------------------------------------------------------------------------------------------------------------ No results for input(s): CHOL, HDL, LDLCALC, TRIG, CHOLHDL, LDLDIRECT in the last 72 hours.  Lab Results  Component Value Date   HGBA1C 6.0 (H) 08/14/2023   ------------------------------------------------------------------------------------------------------------------ No results for input(s): TSH, T4TOTAL, T3FREE, THYROIDAB in the last 72 hours.  Invalid input(s): FREET3  Cardiac Enzymes No results for input(s): CKMB, TROPONINI, MYOGLOBIN in the last 168 hours.  Invalid input(s): CK ------------------------------------------------------------------------------------------------------------------    Component Value Date/Time   BNP 301.2 (H) 07/23/2023 0816    CBG: Recent Labs  Lab 09/22/23 0608 09/22/23 1213 09/22/23 1636 09/22/23 2113 09/23/23 0622  GLUCAP 96 153* 164* 132* 95    No results found for this or any previous visit (from the past 240 hours).    Radiology Studies: CT HEAD WO CONTRAST ( ) Result Date: 09/23/2023 EXAM: CT HEAD WITHOUT CONTRAST 09/22/2023 09:56:16 PM TECHNIQUE: CT of the head was performed without the administration of intravenous contrast. Automated exposure control, iterative reconstruction, and/or weight based adjustment of the mA/kV was utilized to reduce the radiation dose to as low as reasonably achievable. COMPARISON: None available. CLINICAL HISTORY: Transient ischemic attack (TIA). FINDINGS: BRAIN AND VENTRICLES: No acute hemorrhage. Gray-white  differentiation is preserved.  No hydrocephalus. No extra-axial collection. No mass effect or midline shift. Periventricular and scattered subcortical white matter hypoattenuation is moderately advanced for age, stable. Mild generalized atrophy is stable. ORBITS: No acute abnormality. SINUSES: Bilateral mastoid effusions are again noted, right greater than left. Minimal right maxillary sinus disease is again seen. Secretions are present within the right sphenoid sinus. SOFT TISSUES AND SKULL: No acute soft tissue abnormality. No skull fracture. A lytic lesion is again noted within a right greater than left clivus. Multiple smaller lytic lesions are present throughout the calvarium, stable. VASCULATURE: Atherosclerotic calcifications are present in the cavernous carotid arteries bilaterally. No hyperdense vessel is present. IMPRESSION: 1. No acute intracranial abnormality related to the clinical history of TIA. 2. Moderately advanced periventricular and scattered subcortical white matter hypoattenuation, stable. 3. Mild generalized atrophy, stable. 4. Bilateral mastoid effusions, right greater than left, and minimal right maxillary sinus disease, again seen. Secretions are present within the right sphenoid sinus. 5. Lytic lesion within a right greater than left clivus and multiple smaller lytic lesions throughout the calvarium, stable. Electronically signed by: Lonni Necessary MD 09/23/2023 05:20 AM EDT RP Workstation: [id number]     Nilda Fendt, MD, PhD [facility]  Between 7 am - 7 pm I am available, please contact me via Amion (for emergencies) or Securechat (non urgent messages)  Between 7 pm - 7 am I am not available, please contact night coverage MD/APP via Amion

## 2023-09-23 NOTE — Progress Notes (Signed)
 Occupational Therapy Treatment Patient Details Name: Frank [name]. MRN: [id number] DOB: November 23, 1965 Today's Date: 09/23/2023   History of present illness 58 y.o. male transferred from [facility] 07/03/23 to [facility] for management of newly diagnosed plasma cell myeloma with weakness and AMS. Pt also with AKI and metabolic encephalopathy. 5/10 transfer to [facility] for iHD. 4/22 Intubated and chemo initiated. 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 5/6 IVIG started. 5/8-6/11 trach. 5/12 iHD initiated. 5/29 PEG placed. 6/5 chemo initiated. PMHx:Lt THA, carpal tunnel syndrome, Lt TKA, Lt humerus fx, PAF, HTN, HLD, bipolar disorder, obesity, T2DM   OT comments  STAR Patient OT/PT session: Patient received in supine and was explained on STAR program, expectations, and frequency of therapy. Patient stated he was eager to participate. Patient now with sling for LUE shoulder and required max assist to donn. Patient able to get to EOB with increased time and max assist of one from max assist +2. Sit to stands performed from EOB with max assist +2 with education on body mechanics and weight shifting to decrease pain at RLE knee when standing. Patient required max assist +2 to go from sit to sidelying and was left with PT to perform wound care. Acute OT to continue to follow to address established goals with STAR program.      If plan is discharge home, recommend the following:  Two people to help with walking and/or transfers;Assistance with cooking/housework;Assistance with feeding;Direct supervision/assist for medications management;Direct supervision/assist for financial management;Assist for transportation;Help with stairs or ramp for entrance;A lot of help with bathing/dressing/bathroom   Equipment Recommendations  Other (comment) (defer)    Recommendations for Other Services      Precautions / Restrictions Precautions Precautions: Fall Recall of Precautions/Restrictions: Impaired Precaution/Restrictions  Comments: episode of syncope, sacral wound, chemo. Lt humerus non-union, splinted. no elbow ROM, but shoulder ROM ok per secure chat with Dr. Jonel 7/1 Required Braces or Orthoses: Sling Restrictions Weight Bearing Restrictions Per Provider Order: Yes LUE Weight Bearing Per Provider Order: Non weight bearing Other Position/Activity Restrictions: Lt humerus non-union, splinted. no elbow ROM, but shoulder ROM ok per secure chat with Dr. Jonel 7/1       Mobility Bed Mobility Overal bed mobility: Needs Assistance Bed Mobility: Rolling, Supine to Sit, Sit to Sidelying Rolling: Max assist   Supine to sit: Max assist, HOB elevated, Used rails   Sit to sidelying: Max assist, +2 for physical assistance General bed mobility comments: increased time and assistance of one to get to EOB and +2 for sit to sidelying    Transfers Overall transfer level: Needs assistance Equipment used: Ambulation equipment used Transfers: Sit to/from Stand Sit to Stand: Max assist, +2 physical assistance, +2 safety/equipment           General transfer comment: 3 stands performed from EOB with max assist +2 with use of bed pad to assist with lifting hips. Transfer via Lift Equipment: Stedy   Balance Overall balance assessment: Needs assistance Sitting-balance support: Single extremity supported, Feet supported Sitting balance-Leahy Scale: Fair Sitting balance - Comments: CGA to min assist for safety   Standing balance support: Single extremity supported, During functional activity, Reliant on assistive device for balance Standing balance-Leahy Scale: Zero Standing balance comment: stood in Datto x3 with max assist +2 with patient reliant on external support for balance                           ADL either performed or  assessed with clinical judgement   ADL Overall ADL's : Needs assistance/impaired     Grooming: Wash/dry hands;Wash/dry face;Contact guard assist;Sitting            Upper Body Dressing : Moderate assistance;Sitting Upper Body Dressing Details (indicate cue type and reason): donned gown and sling Lower Body Dressing: Total assistance                 General ADL Comments: focused on sitting balance and sit to stands with stedy    Extremity/Trunk Assessment              Vision       Perception     Praxis     Communication Communication Communication: Impaired Factors Affecting Communication: Reduced clarity of speech   Cognition Arousal: Alert Behavior During Therapy: WFL for tasks assessed/performed Cognition: Cognition impaired     Awareness: Intellectual awareness intact   Attention impairment (select first level of impairment): Sustained attention Executive functioning impairment (select all impairments): Problem solving                   Following commands: Impaired Following commands impaired: Follows one step commands with increased time      Cueing   Cueing Techniques: Verbal cues, Tactile cues, Visual cues  Exercises      Shoulder Instructions       General Comments education provided on STAR program and patient is eager to participate    Pertinent Vitals/ Pain       Pain Assessment Pain Assessment: Faces Faces Pain Scale: Hurts little more Pain Location: RLE knee and LUE shoulder Pain Descriptors / Indicators: Sore, Discomfort, Grimacing, Numbness Pain Intervention(s): Limited activity within patient's tolerance, Monitored during session, Repositioned, RN gave pain meds during session  Home Living                                          Prior Functioning/Environment              Frequency  Min 1X/week        Progress Toward Goals  OT Goals(current goals can now be found in the care plan section)  Progress towards OT goals: Progressing toward goals  Acute Rehab OT Goals Patient Stated Goal: to get more mobile OT Goal Formulation: With patient Time For Goal  Achievement: 09/25/23 Potential to Achieve Goals: Fair ADL Goals Pt Will Perform Grooming: with supervision;with set-up;sitting Pt Will Perform Upper Body Dressing: with contact guard assist;with supervision;sitting Pt Will Transfer to Toilet: with max assist;with mod assist;stand pivot transfer;bedside commode Pt/caregiver will Perform Home Exercise Program: Right Upper extremity;Increased strength;With written HEP provided;Independently Additional ADL Goal #1: Pt will complete bed mobility mod A  to sit EOB in prep for ADLs/selfcare tasks  Plan      Co-evaluation    PT/OT/SLP Co-Evaluation/Treatment: Yes Reason for Co-Treatment: For patient/therapist safety;To address functional/ADL transfers PT goals addressed during session: Mobility/safety with mobility;Balance OT goals addressed during session: ADL's and self-care      AM-PAC OT 6 Clicks Daily Activity     Outcome Measure   Help from another person eating meals?: A Little Help from another person taking care of personal grooming?: A Little Help from another person toileting, which includes using toliet, bedpan, or urinal?: Total Help from another person bathing (including washing, rinsing, drying)?: A Lot Help from another person to put on  and taking off regular upper body clothing?: A Lot Help from another person to put on and taking off regular lower body clothing?: Total 6 Click Score: 12    End of Session Equipment Utilized During Treatment: Gait belt;Other (comment) Laurent)  OT Visit Diagnosis: Unsteadiness on feet (R26.81);Other abnormalities of gait and mobility (R26.89);Muscle weakness (generalized) (M62.81);Pain Pain - Right/Left: Right Pain - part of body: Knee;Arm (left arm)   Activity Tolerance Patient tolerated treatment well;Patient limited by pain   Patient Left in bed;with call bell/phone within reach;Other (comment) (left with wound care PT)   Nurse Communication Mobility status        Time:  9141-8986 OT Time Calculation (min): 75 min  Charges: OT General Charges $OT Visit: 1 Visit OT Treatments $Self Care/Home Management : 23-37 mins $Therapeutic Activity: 8-22 mins  [name], OTA Acute Rehabilitation Services  Office [phone]   [name] 09/23/2023, 12:48 PM

## 2023-09-23 NOTE — Progress Notes (Signed)
 Nutrition Follow-up  DOCUMENTATION CODES:  Severe malnutrition in context of acute illness/injury  INTERVENTION:  Continue current diet as ordered Encourage PO intake Ordering assistance Adjust to PO supplements, Ensure Plus High Protein po BID, each supplement provides 350 kcal and 20 grams of protein. Continue Prosource TF20 BID to provide 80 kcal and 20g of protein per serving Continue Juven 1 packet BID via tube, each packet provides 95 calories, 2.5 grams of protein (collagen) + micronutrients to support wound healing MVI with minerals daily   NUTRITION DIAGNOSIS:  Severe Malnutrition related to acute illness (prolonged illness, interuptions in feeding) as evidenced by moderate fat depletion, moderate muscle depletion, percent weight loss (9.6% x 1 month). - remains applicable  GOAL:  Patient will meet greater than or equal to 90% of their needs - met with TF  MONITOR:  TF tolerance, I & O's, Labs, Weight trends  REASON FOR ASSESSMENT:  Consult Assessment of nutrition requirement/status  ASSESSMENT:  58 y.o. male with PMH of obesity, asthma, OSA on CPAP, GERD, HTN, HLD who presented due to feeling weak with AMS for a few days. Admitted with concern for sepsis, community-acquired pneumonia, encephalopathy as well as concern for multiple myeloma.  4/17 Admit to [facility] 4/21 SLP eval - NPO; IR attempted Cortrak but unable to be placed; CCM MD successfully placed NGT 4/22 Concern for aspiration overnight; Intubated 4/23 - CRRT 5/8 - tracheostomy 5/10 - CRRT discontinued 5/11 - transferred to [facility] [place] 5/12 - iHD initiated  5/29 - PEG placed 5/31 - chemotherapy changed to Sarclisa /Kyprolis /Cytoxan  6/9 - MBS, NPO recommended  6/11 - trach removed 6/13 - BSE, NPO, tunneled HD catheter removed for line holiday 6/16 - Tunneled HD replaced,  6/18 - FEES, thin liquids when supervised 6/19, 6/21 - BSE, clear liquids 6/23 - MBS, clear liquids, pt not able to full  participate in study due to pain; no HD since 6/16, line pulled pt showing signs of renal recovery 6/24 - FEES, thin liquids and puree ok 6/27 - BSE - DYS 1, thins  Pt resting in bed at the time of assessment. Awake, alert, and in good spirits today. Lunch tray noted to be well consumed at bedside and meal intake recorded at mostly 90-100% over the last few days. Pt endorses a good appetite. RN reports that pt has been consuming Nepros by mouth, not using PEG for enteral feeds. Pt reports that he is still receiving his prosource by tube, but also is able to drink the juven orally.   Reviewed dining services records and pt is meeting his kcal needs, but not protein with current intake. Will change regimen to assume all needs can be met orally. Changed nepro to Ensure as pt prefers chocolate and decreased to BID. Will leave prosource per tube for the additional protein.  Weight is stable, wound appears to be healing based on size reported by [facility]. Will continue to monitor pt's progress.   Communicated changed to ID pharmacy team as well for voriconazole  administration   Admit weight: 110.6 kg (4/17) Current weight: 97 kg (7/8) - weight was taken after bed scale was zeroed, accurate   Intake/Output Summary (Last 24 hours) at 09/23/2023 1533 Last data filed at 09/23/2023 0900 Gross per 24 hour  Intake 557 ml  Output 2500 ml  Net -1943 ml  Net IO Since Admission: -61,701.94 mL [09/23/23 1533]  Drains/Lines: PEG 20 Fr. LUQ UOP 3,352mL x 24 hours  Nutrition Related Medications:  Scheduled Meds:  NEPRO CARB STEADY  237 mL Per Tube TID PC & HS   PROSource TF20  60 mL Per Tube BID   insulin  aspart  0-15 Units Subcutaneous TID AC & HS   insulin  aspart  2 Units Subcutaneous TID AC & HS   insulin  glargine-yfgn  10 Units Subcutaneous Q24H   multivitamin with minerals  1 tablet Oral Daily   JUVEN  1 packet Oral BID BM   polyethylene glycol  17 g Oral BID   senna  1 tablet Oral Daily   voriconazole    400 mg Oral Q12H   PRN Meds: ondansetron   Labs Reviewed: Sodium 132 BUN 60 CBG ranges from 100-171 mg/dL over the last 24 hours HgbA1c 6.0%  Micronutrient Labs: CRP 1.5  Vitamin A  42.5 (WNL) Zinc  73 (WNL) Folate 17.8 (WNL)  Diet Order:   Diet Order             DIET - DYS 1 Room service appropriate? No; Fluid consistency: Thin  Diet effective now                  EDUCATION NEEDS:  Not appropriate for education at this time  Skin: Skin Integrity Issues Per WOC note 6/23 Unstageable  - sacrum 5 cm x 3 cm x 0.2  Stage 3:  - R Buttocks 3 cm x 2 cm  Stage 2: - L buttocks 0.5 cm x 0.5 cm   Last BM:  7/6 - type 6  Height:  Ht Readings from Last 1 Encounters:  07/22/23 6' 0.01 (1.829 m)   Weight:  Wt Readings from Last 1 Encounters:  09/23/23 97 kg   Ideal Body Weight:  80.9 kg  BMI:  Body mass index is 29 kg/m.  Estimated Nutritional Needs:  Kcal:  2400-2700 kcal/d Protein:  130-150g/d Fluid:  2.5L/d   Vernell Lukes, RD, LDN Registered Dietitian II Please reach out via secure chat

## 2023-09-23 NOTE — Progress Notes (Addendum)
 Physical Therapy Wound Treatment Patient Details  Name: Frank [name]. MRN: [id number] Date of Birth: [birth year]  Today's Date: 09/23/2023 Time: 1013-1109 Time Calculation (min): 56 min  Subjective  Subjective Assessment Patient and Family Stated Goals: to be able to see his grandson Date of Onset:  (exact date unknown) Prior Treatments: Sacral foam  Pain Score:  7/10  Wound Assessment  Pressure Injury 07/11/23 Sacrum Mid Unstageable - Full thickness tissue loss in which the base of the injury is covered by slough (yellow, tan, gray, green or brown) and/or eschar (tan, brown or black) in the wound bed. (Active)  Wound Image   09/17/23 1300  Dressing Type Gauze (Comment);Santyl ;ABD;Tape dressing;Barrier Film (skin prep) 09/23/23 1500  Dressing Changed 09/23/23 1500  Dressing Change Frequency Daily 09/23/23 1500  State of Healing Early/partial granulation 09/23/23 1500  Site / Wound Assessment Clean;Yellow;Pink;Pale;Granulation tissue 09/23/23 1500  % Wound base Red or Granulating 20% 09/23/23 1500  % Wound base Yellow/Fibrinous Exudate 80% 09/23/23 1500  % Wound base Black/Eschar 0% 09/17/23 1300  % Wound base Other/Granulation Tissue (Comment) 0% 09/17/23 1300  Peri-wound Assessment Erythema (blanchable);Maceration 09/23/23 1500  Wound Length (cm) 5.2 cm 09/23/23 1500  Wound Width (cm) 4.3 cm 09/23/23 1500  Wound Depth (cm) 0.5 cm 09/23/23 1500  Wound Surface Area (cm^2) 22.36 cm^2 09/23/23 1500  Wound Volume (cm^3) 11.18 cm^3 09/23/23 1500  Margins Unattached edges (unapproximated) 09/23/23 1500  Drainage Amount Scant 09/23/23 1500  Drainage Description No odor 09/23/23 1500  Treatment Debridement (Selective);Cleansed;Irrigation 09/23/23 1500          Wound Assessment and Plan  Wound Therapy - Assess/Plan/Recommendations Wound Therapy - Clinical Statement: pt with much better tolerance of wound therapy today. Pt asks to see wound image at end of session. unfortunately in  the process the wound image was lost. PT will take photo 7/11. Pt continues to have a good amount of tightly adhered slough on the wound bed and will continue to benefit from debridement to decrease bioburden and promote wound healing. Wound Therapy - Functional Problem List: decreased mobility, cancer and chemo Factors Delaying/Impairing Wound Healing: Diabetes Mellitus, Multiple medical problems, Immobility, Polypharmacy Hydrotherapy Plan: Debridement, Dressing change, Patient/family education Wound Therapy - Frequency: 2X / week Wound Therapy - Current Recommendations: OT, PT, Case manager/social work Wound Therapy - Follow Up Recommendations: dressing changes by RN  Wound Therapy Goals- Improve the function of patient's integumentary system by progressing the wound(s) through the phases of wound healing (inflammation - proliferation - remodeling) by: Wound Therapy Goals - Improve the function of patient's integumentary system by progressing the wound(s) through the phases of wound healing by: Decrease Necrotic Tissue to: 50 Decrease Necrotic Tissue - Progress: Progressing toward goal Increase Granulation Tissue to: 30 Increase Granulation Tissue - Progress: Progressing toward goal Decrease Length/Width/Depth by (cm): L and W by 0.5 cm Decrease Length/Width/Depth - Progress: Not progressing Goals/treatment plan/discharge plan were made with and agreed upon by patient/family: Yes Time For Goal Achievement: 7 days Wound Therapy - Potential for Goals: Fair  Goals will be updated until maximal potential achieved or discharge criteria met.  Discharge criteria: when goals achieved, discharge from hospital, MD decision/surgical intervention, no progress towards goals, refusal/missing three consecutive treatments without notification or medical reason.  GP     Charges PT Wound Care Charges $Wound Debridement up to 20 cm: < or equal to 20 cm $PT Hydrotherapy Dressing: 3 dressings $PT  Hydrotherapy Visit: 1 Visit      Tashea Othman B.  Fleeta Lapidus PT, DPT Acute Rehabilitation Services Please use secure chat or  Call Office [phone]  [name] [facility] 09/23/2023, 3:45 PM

## 2023-09-24 ENCOUNTER — Inpatient Hospital Stay (HOSPITAL_COMMUNITY)

## 2023-09-24 DIAGNOSIS — R6521 Severe sepsis with septic shock: Secondary | ICD-10-CM | POA: Diagnosis not present

## 2023-09-24 DIAGNOSIS — A419 Sepsis, unspecified organism: Secondary | ICD-10-CM | POA: Diagnosis not present

## 2023-09-24 LAB — RENAL FUNCTION PANEL
Albumin: 2.1 g/dL — ABNORMAL LOW (ref 3.5–5.0)
Anion gap: 9 (ref 5–15)
BUN: 41 mg/dL — ABNORMAL HIGH (ref 6–20)
CO2: 22 mmol/L (ref 22–32)
Calcium: 8.3 mg/dL — ABNORMAL LOW (ref 8.9–10.3)
Chloride: 99 mmol/L (ref 98–111)
Creatinine, Ser: 1.01 mg/dL (ref 0.61–1.24)
GFR, Estimated: >60 mL/min (ref >60)
Glucose, Bld: 120 mg/dL — ABNORMAL HIGH (ref 70–99)
Phosphorus: 4.6 mg/dL (ref 2.5–4.6)
Potassium: 4.1 mmol/L (ref 3.5–5.1)
Sodium: 130 mmol/L — ABNORMAL LOW (ref 135–145)

## 2023-09-24 LAB — CBC WITH DIFFERENTIAL/PLATELET
Abs Immature Granulocytes: 0.02 K/uL (ref 0.00–0.07)
Basophils Absolute: 0 K/uL (ref 0.0–0.1)
Basophils Relative: 1 %
Eosinophils Absolute: 0 K/uL (ref 0.0–0.5)
Eosinophils Relative: 0 %
HCT: 25.9 % — ABNORMAL LOW (ref 39.0–52.0)
Hemoglobin: 8.8 g/dL — ABNORMAL LOW (ref 13.0–17.0)
Immature Granulocytes: 0 %
Lymphocytes Relative: 20 %
Lymphs Abs: 1.1 K/uL (ref 0.7–4.0)
MCH: 36.7 pg — ABNORMAL HIGH (ref 26.0–34.0)
MCHC: 34 g/dL (ref 30.0–36.0)
MCV: 107.9 fL — ABNORMAL HIGH (ref 80.0–100.0)
Monocytes Absolute: 1.1 K/uL — ABNORMAL HIGH (ref 0.1–1.0)
Monocytes Relative: 21 %
Neutro Abs: 3.1 K/uL (ref 1.7–7.7)
Neutrophils Relative %: 58 %
Platelets: 254 K/uL (ref 150–400)
RBC: 2.4 MIL/uL — ABNORMAL LOW (ref 4.22–5.81)
RDW: 20.7 % — ABNORMAL HIGH (ref 11.5–15.5)
WBC: 5.4 K/uL (ref 4.0–10.5)
nRBC: 0 % (ref 0.0–0.2)

## 2023-09-24 LAB — COMPREHENSIVE METABOLIC PANEL WITH GFR
ALT: 7 U/L (ref 0–44)
AST: 12 U/L — ABNORMAL LOW (ref 15–41)
Albumin: 2.1 g/dL — ABNORMAL LOW (ref 3.5–5.0)
Alkaline Phosphatase: 84 U/L (ref 38–126)
Anion gap: 10 (ref 5–15)
BUN: 41 mg/dL — ABNORMAL HIGH (ref 6–20)
CO2: 22 mmol/L (ref 22–32)
Calcium: 8.2 mg/dL — ABNORMAL LOW (ref 8.9–10.3)
Chloride: 98 mmol/L (ref 98–111)
Creatinine, Ser: 0.98 mg/dL (ref 0.61–1.24)
GFR, Estimated: >60 mL/min (ref >60)
Glucose, Bld: 123 mg/dL — ABNORMAL HIGH (ref 70–99)
Potassium: 4.1 mmol/L (ref 3.5–5.1)
Sodium: 130 mmol/L — ABNORMAL LOW (ref 135–145)
Total Bilirubin: 0.4 mg/dL (ref 0.0–1.2)
Total Protein: 5.3 g/dL — ABNORMAL LOW (ref 6.5–8.1)

## 2023-09-24 LAB — GLUCOSE, CAPILLARY
Glucose-Capillary: 76 mg/dL (ref 70–99)
Glucose-Capillary: 130 mg/dL — ABNORMAL HIGH (ref 70–99)
Glucose-Capillary: 84 mg/dL (ref 70–99)
Glucose-Capillary: 181 mg/dL — ABNORMAL HIGH (ref 70–99)

## 2023-09-24 LAB — MAGNESIUM: Magnesium: 1.9 mg/dL (ref 1.7–2.4)

## 2023-09-24 MED ORDER — LORAZEPAM 2 MG/ML IJ SOLN
1.0000 mg | Freq: Once | INTRAMUSCULAR | Status: AC
  Administered 2023-09-24: 1 mg via INTRAVENOUS
  Filled 2023-09-24: qty 1

## 2023-09-24 NOTE — Progress Notes (Signed)
 Pt refused wound care this morning. Verbalizes that he would like later in am. Also refused repositioning this morning. Also reports no bowel movenment, states I will let you know if I have a bowel movement.

## 2023-09-24 NOTE — Plan of Care (Signed)
   Problem: Health Behavior/Discharge Planning: Goal: Ability to manage health-related needs will improve Outcome: Progressing   Problem: Clinical Measurements: Goal: Ability to maintain clinical measurements within normal limits will improve Outcome: Progressing

## 2023-09-24 NOTE — Progress Notes (Signed)
Pt back on unit from CT

## 2023-09-24 NOTE — Progress Notes (Signed)
 PT Cancellation Note  Patient Details Name: Frank [name]. MRN: [id number] DOB: 1966-01-13   Cancelled Treatment:    Reason Eval/Treat Not Completed: (P) Medical issues which prohibited therapy Pt was given Ativan  this morning for MRI. Despite maximal stimulation. Therapy not able to rouse patient enough to participate in therapy. PT will follow back for treatment tomorrow.   Arloa Prak B. Fleeta Lapidus PT, DPT Acute Rehabilitation Services Please use secure chat or  Call Office [phone]    [name] [facility] 09/24/2023, 2:41 PM

## 2023-09-24 NOTE — Progress Notes (Addendum)
 SLP Cancellation Note  Patient Details Name: [name]. MRN: [id number] DOB: 1965-07-20   Cancelled treatment:       Reason Eval/Treat Not Completed: Patient's level of consciousness. Woke up Dylan at 9:55 to work on [employer], but he said I thought we were doing that this afternoon. No plan for time has been made with pt. Explained that I need to try out some some solid foods this am to figure out if we need to do another FEES later today. Phineas nodded but went back to sleep and wouldn't open eyes and interact again with SLP.    Krystyn Picking, Consuelo Fitch 09/24/2023, 9:53 AM

## 2023-09-24 NOTE — Progress Notes (Signed)
 Pt unable to tolerate MRI even with medication. Was fast asleep until we advanced him into the magnet. He immediately woke up and began to panic, pulling at head coil. He then refused to try again.

## 2023-09-24 NOTE — Plan of Care (Signed)
  Problem: Education: Goal: Knowledge of General Education information will improve Description: Including pain rating scale, medication(s)/side effects and non-pharmacologic comfort measures Outcome: Progressing   Problem: Health Behavior/Discharge Planning: Goal: Ability to manage health-related needs will improve Outcome: Not Progressing   

## 2023-09-24 NOTE — Progress Notes (Signed)
Pt off of unit for CT. 

## 2023-09-24 NOTE — Progress Notes (Signed)
 OT Cancellation Note  Patient Details Name: Frank [name]. MRN: [id number] DOB: 1965-04-26   Cancelled Treatment:    Reason Eval/Treat Not Completed: Fatigue/lethargy limiting ability to participate (patient lethargic this afternoon, possible due to medication. Patient asked to not to paricipate due to lethargic and not feeling well. Will continue with OT tomorrow.)  [name] 09/24/2023, 2:07 PM  [name], OTA Acute Rehabilitation Services  Office [phone]

## 2023-09-24 NOTE — Progress Notes (Signed)
 PROGRESS NOTE    Frank [name].  FMW:[id number] DOB: 1965-08-01 DOA: 07/03/2023 PCP: [name], MD   Brief Narrative: 58 year old PMH OSA CPAP, pathological fracture and multiple ORIF left Humerus likely due to multiple myeloma, A-fib not on anticoagulation, hyperlipidemia, tobacco use disorder who was feeling weak for 1 week, admitted to [facility] with concern of sepsis, community-acquired pneumonia and acute encephalopathy. Patient was found to have lytic lesions, he was transferred to [facility] for oncology treatment. In the meantime he developed encephalopathy, acute renal failure. He remained in the ICU for 36 days. Underwent tracheostomy. He was also started on HD after CRRT. Subsequently PEG tube placed 5/29.   Significant events 4/17-Admitted to medical floor with oncology consultation to treat multiple myeloma. 4/19-transferred to ICU intubated, encephalopathic. 4/20-A-fib with RVR. Started heparin . Groin and biopsy site hematoma requiring multiple transfusions. EEG 4/25 >> moderate to severe encephalopathy without any seizures or epileptiform discharges. 4/29 Received Velcade  and Cytoxan  chemotherapy-intent palliative? 5/2 LP and ETT exchange  5/8 Trach placed. Some post op bleeding improved with thrombi-pad 5/10 Voriconazole  added back based on CT Chest findings 5/13 more alert and able to follow some commands. 5/23, transferred to floor. 5/29-PEG placed 5/31 chemotherapy changed to Sarclisa /Kyprolis /Cytoxan . 6/13 plan to remove HD cathter for holiday line 6/16 new HD catheter placed 6/23 has not required HD for a week, HD catheter removed 6/28 finished first cycle of chemo, next cycle to start in 2 weeks   Assessment & Plan:   Principal Problem:   Severe sepsis with septic shock (HCC) Active Problems:   Aspergillosis, unspecified (HCC)   Multiple myeloma not having achieved remission (HCC)   Lobar pneumonia, unspecified organism (HCC)   Closed fracture  of left distal humerus   AKI (acute kidney injury) (HCC)   Acute hypoxemic respiratory failure (HCC)   PAF (paroxysmal atrial fibrillation) (HCC)   Acute metabolic encephalopathy   Protein-calorie malnutrition, severe   Hematoma of right thigh   Seizure disorder (HCC)   DMII (diabetes mellitus, type 2) (HCC)   Pressure injury of skin   CAD (coronary artery disease)   Chronic diastolic CHF (congestive heart failure) (HCC)   CKD (chronic kidney disease), stage IIIa (HCC)   Class 1 obesity   Asthma   Bipolar disorder (HCC)   Febrile neutropenia (HCC)   Generalized weakness   Anemia  Recurrent fevers, sepsis, Staph Epi Bacteremia - -Hospital course complicated by intermittent fevers, ID consulted and most recently evaluated patient 6/17.   -He had a fever and MRSE bacteremia 1/4 bottles, completed a course of vancomycin  with the end date 6/20.  He did receive a line holiday.  Repeat blood culture 6/9 without growth. - He has unstageable sacral ulcer, continue local wound care.     Aspergillosis with pneumonia -On Voriconazole  per ID, On Acyclovir .  End date for voriconazole  is 8/5, continue.   Right leg numbness -patient also weak, but that is more generalized due to being in bed for almost 3 months.   -CT scan without acute findings. MRI order.  -he has right hip pain will get CT Right hop as well.    Multiple myeloma -oncology following. Started on Velcade  and Cytoxan  by Oncology, transitioned to Kyprolis  then Sarclisa .   - Sarclisa  per Oncology - Next dose chemo due ~7/14   Goals of care - Unfortunately poor long-term prognosis. Palliative care has discussed with wife previously.  Remain full code on full scope of care.  Awaiting SNF placement  but he needs to be able to transfer better and be able to sit up in the chair for outpatient chemo which he is unable to do currently   Acute Hypoxic Respiratory Failure -  trach removed 6/11.  Respiratory status stable.   Right Thigh  Hematoma - per CT on 08/01/23: 12.1 x 30.2 x 7.6 cm subcutaneous hematoma within the anterior right thigh. 8.4 x 10.4 x 22.2 cm subcutaneous hematoma within the right gluteal region - continue supportive care.  Hemoglobin stable   Severe acute metabolic encephalopathy with hypoactive delirium, seizure disorder - Normal MRI of the brain.  LP negative for meningitis or encephalitis.  EEG negative for seizures but encephalopathy. Home Depakote  discontinued in ICU.   Insulin -dependent diabetes type 2 with hyperglycemia- A1c 6.0, continue with sliding scale insulin .  Hypoglycemia now resolved   AKI on CKD stage IIIa, Uremia,  Hyponatremia, Hypokalemia -Due to MM, hypercalcemia and ACE inhibitors?  Nephrology consulted, was on hemodialysis but eventually he tolerated being off HD, tunneled catheter discontinued per nephrology.  Renal function is stable   Paroxysmal A-fib - Unable to  anticoagulation due to hematoma.  Rate controlled   Obstructive Sleep Apnea -Trach removed   Anxiety/Bipolar disorder - Continue with Paxil .     Severe Malnutrition - Continue tube feeding.  He is allowed dysphagia 1 per speech   History of Pathological fracture and multiple ORIF left Humerus 01/2023  -Dr. Jonel discussed with orthopedics on 6/26, options would involve either a substantial reconstruction with antibiotic cement and prolonged, difficult recovery, or above elbow amputation.  [name] recommends more rehabilitation at this time prior to attempting surgery. - Maintain left arm splint when out of bed - [name] following, since he is not clear when he will discharge likely to have surgery here in couple of weeks.  Date to be determined   Hypokalemia -potassium now normalized.  Hyponatremia -stable.    See wound care documentation below.  Pressure Injury 07/05/23 Heel Left;Right Unstageable - Full thickness tissue loss in which the base of the injury is covered by slough (yellow, tan, gray, green or  brown) and/or eschar (tan, brown or black) in the wound bed. (Active)  07/05/23 1108  Location: Heel  Location Orientation: Left;Right  Staging: Unstageable - Full thickness tissue loss in which the base of the injury is covered by slough (yellow, tan, gray, green or brown) and/or eschar (tan, brown or black) in the wound bed.  Wound Description (Comments):   DO NOT USE:  Present on Admission: Yes  Dressing Type Gauze (Comment) 09/23/23 0800     Pressure Injury 07/11/23 Sacrum Mid Unstageable - Full thickness tissue loss in which the base of the injury is covered by slough (yellow, tan, gray, green or brown) and/or eschar (tan, brown or black) in the wound bed. (Active)  07/11/23 1600  Location: Sacrum  Location Orientation: Mid  Staging: Unstageable - Full thickness tissue loss in which the base of the injury is covered by slough (yellow, tan, gray, green or brown) and/or eschar (tan, brown or black) in the wound bed.  Wound Description (Comments):   DO NOT USE:  Present on Admission: No  Dressing Type ABD 09/23/23 2135     Pressure Injury 07/27/23 Buttocks Right;Lower (Active)  07/27/23 1700  Location: Buttocks  Location Orientation: Right;Lower  Staging:   Wound Description (Comments):   DO NOT USE:  Present on Admission: Yes  Dressing Type Foam - Lift dressing to assess site every shift 09/23/23  0800     Pressure Injury 08/25/23 Buttocks Left Stage 2 -  Partial thickness loss of dermis presenting as a shallow open injury with a red, pink wound bed without slough. (Active)  08/25/23   Location: Buttocks  Location Orientation: Left  Staging: Stage 2 -  Partial thickness loss of dermis presenting as a shallow open injury with a red, pink wound bed without slough.  Wound Description (Comments):   DO NOT USE:  Present on Admission: No  Dressing Type Foam - Lift dressing to assess site every shift 09/23/23 0800     Nutrition Problem: Severe Malnutrition Etiology: acute illness  (prolonged illness, interuptions in feeding)    Signs/Symptoms: moderate fat depletion, moderate muscle depletion, percent weight loss (9.6% x 1 month) Percent weight loss: 9.6 %    Interventions: Prostat, Tube feeding, MVI  Estimated body mass index is 29.12 kg/m as calculated from the following:   Height as of this encounter: 6' 0.01 (1.829 m).   Weight as of this encounter: 97.4 kg.   DVT prophylaxis: Lovenox .  Code Status: Full code Family Communication: Disposition Plan:  Status is: Inpatient Remains inpatient appropriate because: needs to be able sit up to be able to go out patient HD    Consultants:  Oncology Nephrology  Orthopedic.    Procedures:  HD Chemotherapy  Antimicrobials:    Subjective: He is alert and conversant. He is able to moves extremities, except for right LE due to pain. Report hip pain, and numbness right leg.    Objective: Vitals:   09/23/23 2019 09/23/23 2322 09/24/23 0405 09/24/23 0500  BP: (!) 134/91 130/89 135/89   Pulse: (!) 105 96 92   Resp: 19 16 18    Temp: 98.6 F (37 C) 98.7 F (37.1 C) 98.7 F (37.1 C)   TempSrc: Oral Oral Oral   SpO2: 97% 98% 92%   Weight:    97.4 kg  Height:        Intake/Output Summary (Last 24 hours) at 09/24/2023 0701 Last data filed at 09/24/2023 0405 Gross per 24 hour  Intake 1620 ml  Output 3700 ml  Net -2080 ml   Filed Weights   09/20/23 0500 09/23/23 1453 09/24/23 0500  Weight: 105 kg 97 kg 97.4 kg    Examination:  General exam: Appears calm and comfortable  Respiratory system: Clear to auscultation. Respiratory effort normal. Cardiovascular system: S1 & S2 heard, RRR. No JVD, murmurs, rubs, gallops or clicks. No pedal edema. Gastrointestinal system: Abdomen is nondistended, soft and nontender. No organomegaly or masses felt. Normal bowel sounds heard. Central nervous system: Alert and oriented. Follows command, generalized weakness, more difficulty moving right leg due to pain.   Extremities: trace edema    Data Reviewed: I have personally reviewed following labs and imaging studies  CBC: Recent Labs  Lab 09/17/23 0837 09/19/23 1003 09/20/23 0500 09/22/23 1030 09/23/23 0821  WBC 3.9* 5.2 3.9* 6.6 4.5  NEUTROABS  --   --   --  3.6 2.6  HGB 8.4* 8.6* 8.8* 8.7* 8.5*  HCT 25.1* 26.4* 25.2* 25.4* 25.9*  MCV 108.2* 107.8* 106.8* 107.6* 107.9*  PLT 209 222 245 266 275   Basic Metabolic Panel: Recent Labs  Lab 09/18/23 1100 09/19/23 1003 09/20/23 0500 09/21/23 0330 09/23/23 0821 09/23/23 0822  NA 131* 134*  133* 130* 129* 132* 133*  K 4.4 4.1  4.1 4.4 4.1 4.0 4.1  CL 99 101  101 99 99 102 102  CO2 24 23  23  22 22 21* 25  GLUCOSE 91 135*  134* 87 129* 143* 141*  BUN 59* 54*  55* 54* 56* 43* 45*  CREATININE 0.98 1.00  1.08 0.95 0.95 0.94 0.94  CALCIUM  8.1* 8.1*  7.9* 8.2* 8.4* 8.2* 8.3*  MG  --  2.2 2.2  --  2.0  --   PHOS 4.2 4.3 4.0 4.1  --  4.2   GFR: Estimated Creatinine Clearance: 102.3 mL/min (by C-G formula based on SCr of 0.94 mg/dL). Liver Function Tests: Recent Labs  Lab 09/19/23 1003 09/20/23 0500 09/21/23 0330 09/23/23 0821 09/23/23 0822  AST 15  --   --  11*  --   ALT 10  --   --  8  --   ALKPHOS 106  --   --  93  --   BILITOT 0.5  --   --  0.4  --   PROT 5.2*  --   --  5.2*  --   ALBUMIN  2.1*  2.1* 2.1* 2.0* 2.0* 2.0*   No results for input(s): LIPASE, AMYLASE in the last 168 hours. No results for input(s): AMMONIA in the last 168 hours. Coagulation Profile: No results for input(s): INR, PROTIME in the last 168 hours. Cardiac Enzymes: No results for input(s): CKTOTAL, CKMB, CKMBINDEX, TROPONINI in the last 168 hours. BNP (last 3 results) No results for input(s): PROBNP in the last 8760 hours. HbA1C: No results for input(s): HGBA1C in the last 72 hours. CBG: Recent Labs  Lab 09/23/23 0622 09/23/23 1111 09/23/23 1739 09/23/23 2135 09/24/23 0606  GLUCAP 95 146* 118* 131* 76   Lipid  Profile: No results for input(s): CHOL, HDL, LDLCALC, TRIG, CHOLHDL, LDLDIRECT in the last 72 hours. Thyroid Function Tests: No results for input(s): TSH, T4TOTAL, FREET4, T3FREE, THYROIDAB in the last 72 hours. Anemia Panel: No results for input(s): VITAMINB12, FOLATE, FERRITIN, TIBC, IRON, RETICCTPCT in the last 72 hours. Sepsis Labs: No results for input(s): PROCALCITON, LATICACIDVEN in the last 168 hours.  No results found for this or any previous visit (from the past 240 hours).       Radiology Studies: CT HEAD WO CONTRAST ( ) Result Date: 09/23/2023 EXAM: CT HEAD WITHOUT CONTRAST 09/22/2023 09:56:16 PM TECHNIQUE: CT of the head was performed without the administration of intravenous contrast. Automated exposure control, iterative reconstruction, and/or weight based adjustment of the mA/kV was utilized to reduce the radiation dose to as low as reasonably achievable. COMPARISON: None available. CLINICAL HISTORY: Transient ischemic attack (TIA). FINDINGS: BRAIN AND VENTRICLES: No acute hemorrhage. Gray-white differentiation is preserved. No hydrocephalus. No extra-axial collection. No mass effect or midline shift. Periventricular and scattered subcortical white matter hypoattenuation is moderately advanced for age, stable. Mild generalized atrophy is stable. ORBITS: No acute abnormality. SINUSES: Bilateral mastoid effusions are again noted, right greater than left. Minimal right maxillary sinus disease is again seen. Secretions are present within the right sphenoid sinus. SOFT TISSUES AND SKULL: No acute soft tissue abnormality. No skull fracture. A lytic lesion is again noted within a right greater than left clivus. Multiple smaller lytic lesions are present throughout the calvarium, stable. VASCULATURE: Atherosclerotic calcifications are present in the cavernous carotid arteries bilaterally. No hyperdense vessel is present. IMPRESSION: 1. No acute  intracranial abnormality related to the clinical history of TIA. 2. Moderately advanced periventricular and scattered subcortical white matter hypoattenuation, stable. 3. Mild generalized atrophy, stable. 4. Bilateral mastoid effusions, right greater than left, and minimal right maxillary sinus disease, again seen. Secretions are present within the  right sphenoid sinus. 5. Lytic lesion within a right greater than left clivus and multiple smaller lytic lesions throughout the calvarium, stable. Electronically signed by: Lonni Necessary MD 09/23/2023 05:20 AM EDT RP Workstation: [id number]        Scheduled Meds:  acyclovir   400 mg Oral BID   collagenase    Topical Daily   enoxaparin  (LOVENOX ) injection  50 mg Subcutaneous Daily   feeding supplement  237 mL Oral BID WC   feeding supplement (PROSource TF20)  60 mL Per Tube BID   free water   50 mL Per Tube Daily   insulin  aspart  0-15 Units Subcutaneous TID AC & HS   insulin  aspart  2 Units Subcutaneous TID AC & HS   insulin  glargine-yfgn  10 Units Subcutaneous Q24H   montelukast   5 mg Oral QHS   multivitamin with minerals  1 tablet Oral Daily   nutrition supplement (JUVEN)  1 packet Oral BID BM   mouth rinse  15 mL Mouth Rinse 4 times per day   PARoxetine   40 mg Oral Daily   polyethylene glycol  17 g Oral BID   senna  1 tablet Oral Daily   sodium chloride  flush  10-40 mL Intracatheter Q12H   umeclidinium-vilanterol  1 puff Inhalation Daily   valproic  acid  750 mg Oral BID   voriconazole   400 mg Oral Q12H   Continuous Infusions:  sodium chloride  Stopped (08/28/23 1208)   sodium chloride  Stopped (08/28/23 0839)     LOS: 83 days    Time spent: 35 minutes    Zakiyah Diop A Lavita Pontius, MD [facility]   If 7PM-7AM, please contact night-coverage [email or web address]  09/24/2023, 7:01 AM

## 2023-09-24 NOTE — Progress Notes (Signed)
 This chaplain attempted a F/U spiritual care visit. The Pt. is sleeping at the time of the visit and did not respond to his name. The chaplain updated the RN and will plan a time for a revisit.  Chaplain Leeroy Hummer [phone]

## 2023-09-25 ENCOUNTER — Inpatient Hospital Stay (HOSPITAL_COMMUNITY)

## 2023-09-25 ENCOUNTER — Other Ambulatory Visit (HOSPITAL_COMMUNITY)

## 2023-09-25 DIAGNOSIS — R6521 Severe sepsis with septic shock: Secondary | ICD-10-CM | POA: Diagnosis not present

## 2023-09-25 DIAGNOSIS — C9000 Multiple myeloma not having achieved remission: Secondary | ICD-10-CM | POA: Diagnosis not present

## 2023-09-25 DIAGNOSIS — A419 Sepsis, unspecified organism: Secondary | ICD-10-CM | POA: Diagnosis not present

## 2023-09-25 DIAGNOSIS — R531 Weakness: Secondary | ICD-10-CM | POA: Diagnosis not present

## 2023-09-25 DIAGNOSIS — D649 Anemia, unspecified: Secondary | ICD-10-CM | POA: Diagnosis not present

## 2023-09-25 LAB — CBC WITH DIFFERENTIAL/PLATELET
Abs Immature Granulocytes: 0.02 K/uL (ref 0.00–0.07)
Basophils Absolute: 0 K/uL (ref 0.0–0.1)
Basophils Relative: 1 %
Eosinophils Absolute: 0 K/uL (ref 0.0–0.5)
Eosinophils Relative: 0 %
HCT: 23.4 % — ABNORMAL LOW (ref 39.0–52.0)
Hemoglobin: 7.7 g/dL — ABNORMAL LOW (ref 13.0–17.0)
Immature Granulocytes: 0 %
Lymphocytes Relative: 28 %
Lymphs Abs: 1.5 K/uL (ref 0.7–4.0)
MCH: 35.6 pg — ABNORMAL HIGH (ref 26.0–34.0)
MCHC: 32.9 g/dL (ref 30.0–36.0)
MCV: 108.3 fL — ABNORMAL HIGH (ref 80.0–100.0)
Monocytes Absolute: 1.4 K/uL — ABNORMAL HIGH (ref 0.1–1.0)
Monocytes Relative: 26 %
Neutro Abs: 2.4 K/uL (ref 1.7–7.7)
Neutrophils Relative %: 45 %
Platelets: 284 K/uL (ref 150–400)
RBC: 2.16 MIL/uL — ABNORMAL LOW (ref 4.22–5.81)
RDW: 20.4 % — ABNORMAL HIGH (ref 11.5–15.5)
WBC: 5.2 K/uL (ref 4.0–10.5)
nRBC: 0 % (ref 0.0–0.2)

## 2023-09-25 LAB — GLUCOSE, CAPILLARY
Glucose-Capillary: 76 mg/dL (ref 70–99)
Glucose-Capillary: 139 mg/dL — ABNORMAL HIGH (ref 70–99)
Glucose-Capillary: 150 mg/dL — ABNORMAL HIGH (ref 70–99)
Glucose-Capillary: 119 mg/dL — ABNORMAL HIGH (ref 70–99)
Glucose-Capillary: 140 mg/dL — ABNORMAL HIGH (ref 70–99)

## 2023-09-25 LAB — COMPREHENSIVE METABOLIC PANEL WITH GFR
ALT: 7 U/L (ref 0–44)
AST: 9 U/L — ABNORMAL LOW (ref 15–41)
Albumin: 1.9 g/dL — ABNORMAL LOW (ref 3.5–5.0)
Alkaline Phosphatase: 79 U/L (ref 38–126)
Anion gap: 11 (ref 5–15)
BUN: 42 mg/dL — ABNORMAL HIGH (ref 6–20)
CO2: 19 mmol/L — ABNORMAL LOW (ref 22–32)
Calcium: 7.8 mg/dL — ABNORMAL LOW (ref 8.9–10.3)
Chloride: 98 mmol/L (ref 98–111)
Creatinine, Ser: 0.93 mg/dL (ref 0.61–1.24)
GFR, Estimated: >60 mL/min (ref >60)
Glucose, Bld: 127 mg/dL — ABNORMAL HIGH (ref 70–99)
Potassium: 4.6 mmol/L (ref 3.5–5.1)
Sodium: 128 mmol/L — ABNORMAL LOW (ref 135–145)
Total Bilirubin: 0.4 mg/dL (ref 0.0–1.2)
Total Protein: 5.2 g/dL — ABNORMAL LOW (ref 6.5–8.1)

## 2023-09-25 NOTE — Progress Notes (Signed)
 Physical Therapy Treatment Patient Details Name: Frank [name]. MRN: [id number] DOB: [birth year] Today's Date: 09/25/2023   History of Present Illness 58 y.o. male transferred from [facility] 07/03/23 to [facility] for management of newly diagnosed plasma cell myeloma with weakness and AMS. Pt also with AKI and metabolic encephalopathy. 5/10 transfer to [facility] for iHD. 4/22 Intubated and chemo initiated. 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 5/6 IVIG started. 5/8-6/11 trach. 5/12 iHD initiated. 5/29 PEG placed. 6/5 chemo initiated. PMHx:Lt THA, carpal tunnel syndrome, Lt TKA, Lt humerus fx, PAF, HTN, HLD, bipolar disorder, obesity, T2DM    PT Comments  STAR PT/OT Session: Pt family in room on entry and very supportive of patient. Pt eager to show them what he can do however is limited in safe mobility by increased R LE pain with movement and especially with weight bearing. Pt requires maxAx2 for bed mobility and 2x transfers with [facility]. Pt is unable to come to fully upright in [place] due to pain. Pt with increased fatigue and SoB with attempts at transfer requiring increased time to recover. PT has reviewed and updated goals. D/c plans remain appropriate at this time. PT will continue to follow acutely.     If plan is discharge home, recommend the following: Two people to help with walking and/or transfers;Assistance with cooking/housework;Assist for transportation;Two people to help with bathing/dressing/bathroom;Direct supervision/assist for medications management;Direct supervision/assist for financial management;Help with stairs or ramp for entrance;Assistance with feeding   Can travel by private vehicle     No  Equipment Recommendations  Hospital bed;Hoyer lift;Wheelchair (measurements PT);Wheelchair cushion (measurements PT);BSC/3in1       Precautions / Restrictions Precautions Precautions: Fall Recall of Precautions/Restrictions: Impaired Precaution/Restrictions Comments: episode of syncope, sacral  wound, chemo. Lt humerus non-union, splinted. no elbow ROM, but shoulder ROM ok per secure chat with Dr. Jonel 7/1 Required Braces or Orthoses: Sling Restrictions Weight Bearing Restrictions Per Provider Order: Yes LUE Weight Bearing Per Provider Order: Non weight bearing Other Position/Activity Restrictions: Lt humerus non-union, splinted. no elbow ROM, but shoulder ROM ok per secure chat with Dr. Jonel 7/1     Mobility  Bed Mobility Overal bed mobility: Needs Assistance Bed Mobility: Rolling, Supine to Sit, Sit to Sidelying Rolling: Max assist   Supine to sit: Max assist, HOB elevated, Used rails   Sit to sidelying: Max assist, +2 for physical assistance General bed mobility comments: increased time and use of bed pad to assist.    Transfers Overall transfer level: Needs assistance Equipment used: Ambulation equipment used Transfers: Sit to/from Stand Sit to Stand: Max assist, +2 physical assistance, +2 safety/equipment           General transfer comment: 2 stands performed from EOB into Stedy with max assist +2 using bed pads to assist with hips Transfer via Lift Equipment: Stedy         Balance Overall balance assessment: Needs assistance Sitting-balance support: Single extremity supported, Feet supported Sitting balance-Leahy Scale: Fair Sitting balance - Comments: CGA to min assist for safety Postural control: Right lateral lean Standing balance support: Single extremity supported, During functional activity, Reliant on assistive device for balance Standing balance-Leahy Scale: Zero Standing balance comment: stood x2 into Stedy with max assist +2                            Communication Communication Communication: Impaired Factors Affecting Communication: Reduced clarity of speech  Cognition Arousal: Alert Behavior During Therapy: [facility]  for tasks assessed/performed                             Following commands:  Impaired Following commands impaired: Follows one step commands with increased time    Cueing Cueing Techniques: Verbal cues, Tactile cues, Visual cues  Exercises Other Exercises Other Exercises: x2 sit to stand    General Comments General comments (skin integrity, edema, etc.): Patient's family present during visit and supportive      Pertinent Vitals/Pain Pain Assessment Pain Assessment: Faces Faces Pain Scale: Hurts whole lot Pain Location: RLE knee and LUE shoulder Pain Descriptors / Indicators: Sore, Discomfort, Grimacing, Numbness Pain Intervention(s): Limited activity within patient's tolerance, Monitored during session, Repositioned     PT Goals (current goals can now be found in the care plan section) Acute Rehab PT Goals PT Goal Formulation: With patient Time For Goal Achievement: 10/09/23 Potential to Achieve Goals: Fair Progress towards PT goals: Progressing toward goals    Frequency    Min 5X/week       Co-evaluation   Reason for Co-Treatment: For patient/therapist safety;To address functional/ADL transfers PT goals addressed during session: Mobility/safety with mobility;Balance OT goals addressed during session: ADL's and self-care      AM-PAC PT 6 Clicks Mobility   Outcome Measure  Help needed turning from your back to your side while in a flat bed without using bedrails?: A Lot Help needed moving from lying on your back to sitting on the side of a flat bed without using bedrails?: Total Help needed moving to and from a bed to a chair (including a wheelchair)?: Total Help needed standing up from a chair using your arms (e.g., wheelchair or bedside chair)?: Total Help needed to walk in hospital room?: Total Help needed climbing 3-5 steps with a railing? : Total 6 Click Score: 7    End of Session Equipment Utilized During Treatment: Gait belt Activity Tolerance: Patient limited by pain Patient left: in bed;with call bell/phone within  reach Nurse Communication: Mobility status PT Visit Diagnosis: Other abnormalities of gait and mobility (R26.89);Muscle weakness (generalized) (M62.81);Difficulty in walking, not elsewhere classified (R26.2);Unsteadiness on feet (R26.81)     Time: 8681-8641 PT Time Calculation (min) (ACUTE ONLY): 40 min  Charges:    $Therapeutic Activity: 8-22 mins PT General Charges $$ ACUTE PT VISIT: 1 Visit                     Rosamund Nyland B. Fleeta Lapidus PT, DPT Acute Rehabilitation Services Please use secure chat or  Call Office [phone]    [name] 09/25/2023, 2:45 PM

## 2023-09-25 NOTE — Progress Notes (Signed)
 Frank [name].   DOB:[birth year]   FM#:[id number]      ASSESSMENT & PLAN:  Frank [name] is a 58 year old male patient admitted on 07/03/2023 as a direct transfer from [facility] in [place] .  There was a concern for multiple myeloma and oncology was asked to see him.  Patient has been followed very closely by [name].   Multiple myeloma - Patient has been hospitalized x 3 months.  Was being worked up at [facility] in [place] for multiple myeloma. - Myeloma confirmed during this hospitalization - Initiated chemotherapy on 08/21/2023. Initially given Velcade /Cytoxan . Now on Sarclisa /Kyprolis /Cytoxan .   -- Cycle 2 chemotherapy scheduled for 10/02/2023. - Myeloma responding well to treatment, IgG decreased from 11,507 to WNL 1470. -- Patient is stable and continues to improve. -- Continue PT/Rehab as ordered.  - Medical oncology/[name] managing   Anemia - Likely secondary to malignancy, renal dysfunction, recent chemotherapy - Hemoglobin 8.8 on 09/24/2023. No transfusional intervention at this time. - Recommend PRBC transfusion for hemoglobin <7.0 - Continue to monitor CBC with differential  Left humerus pathologic fracture with multiple ORIF Hematoma, right thigh --Secondary to malignancy --Patient complains of worsening hip/leg pain  --Continue supportive care --Seen by Ortho, recommending IR drain placement of hematoma -- Also noted discussion of possible above elbow amputation of left humerus or substantial reconstruction. More rehab needed prior to surgery. -- Will continue to follow.   AKI - stable renal function. Patient is status post HD and CRRT.  Due to improvement in renal function, HD was discontinued. -- Continue to monitor renal function  Encephalopathy --Improved significantly    Code Status Full  Subjective:  Patient seen awake and alert laying in bed. Reports pain of thigh with hematoma, hoping to get it drained. Also complains of pain left  arm with sling intact.  No other complaints offered. Family members at bedside.  Objective:   Intake/Output Summary (Last 24 hours) at 09/25/2023 1258 Last data filed at 09/25/2023 0934 Gross per 24 hour  Intake 596 ml  Output 1600 ml  Net -1004 ml     PHYSICAL EXAMINATION: ECOG PERFORMANCE STATUS: 3 - Symptomatic, >50% confined to bed  Vitals:   09/25/23 0722 09/25/23 1100  BP: (!) 143/101 (!) 143/101  Pulse: 99 (!) 102  Resp: 17 18  Temp: 98.4 F (36.9 C) (!) 97.5 F (36.4 C)  SpO2: 98% 98%   Filed Weights   09/23/23 1453 09/24/23 0500 09/25/23 0500  Weight: 213 lb 13.5 oz (97 kg) 214 lb 11.7 oz (97.4 kg) 216 lb 7.9 oz (98.2 kg)    GENERAL: alert, no distress and comfortable SKIN: +Pale skin color, texture, turgor are normal, no rashes or significant lesions EYES: normal, conjunctiva are pink and non-injected, sclera clear OROPHARYNX: no exudate, no erythema and lips, buccal mucosa, and tongue normal  NECK: supple, thyroid normal size, non-tender, without nodularity LYMPH: no palpable lymphadenopathy in the cervical, axillary or inguinal LUNGS: clear to auscultation and percussion with normal breathing effort HEART: regular rate & rhythm and no murmurs and no lower extremity edema ABDOMEN: abdomen soft, non-tender and normal bowel sounds MUSCULOSKELETAL: +weak/reports standing with assist today PSYCH: alert & oriented x 3 with fluent speech NEURO: no focal motor/sensory deficits   All questions were answered. The patient knows to call the clinic with any problems, questions or concerns.   The total time spent in the appointment was 40 minutes encounter with patient including review of chart and various tests results,  discussions about plan of care and coordination of care plan  [name], NP 09/25/2023 12:58 PM    Labs Reviewed:  Lab Results  Component Value Date   WBC 5.4 09/24/2023   HGB 8.8 (L) 09/24/2023   HCT 25.9 (L) 09/24/2023   MCV 107.9 (H)  09/24/2023   PLT 254 09/24/2023   Recent Labs    07/23/23 0416 07/23/23 1722 08/28/23 0515 08/29/23 0752 09/19/23 1003 09/20/23 0500 09/23/23 0821 09/23/23 0822 09/24/23 0806 09/24/23 0809  NA 128*   < > 131*   < > 134*  133*   < > 132* 133* 130* 130*  K 4.2   < > 3.5   < > 4.1  4.1   < > 4.0 4.1 4.1 4.1  CL 99   < > 94*   < > 101  101   < > 102 102 98 99  CO2 24   < > 26   < > 23  23   < > 21* 25 22 22   GLUCOSE 128*   < > 86   < > 135*  134*   < > 143* 141* 123* 120*  BUN 33*   < > 60*   < > 54*  55*   < > 43* 45* 41* 41*  CREATININE 1.62*   < > 1.67*   < > 1.00  1.08   < > 0.94 0.94 0.98 1.01  CALCIUM  7.5*   < > 8.5*   < > 8.1*  7.9*   < > 8.2* 8.3* 8.2* 8.3*  GFRNONAA 49*   < > 47*   < > >60  >60   < > >60 >60 >60 >60  PROT 8.5*   < > 6.5   < > 5.2*  --  5.2*  --  5.3*  --   ALBUMIN  <1.5*  <1.5*   < > 2.4*  2.4*   < > 2.1*  2.1*   < > 2.0* 2.0* 2.1* 2.1*  AST 19   < > 21   < > 15  --  11*  --  12*  --   ALT 14   < > 16   < > 10  --  8  --  7  --   ALKPHOS 59   < > 55   < > 106  --  93  --  84  --   BILITOT 0.5   < > 0.6   < > 0.5  --  0.4  --  0.4  --   BILIDIR <0.1  --  0.1  --   --   --   --   --   --   --   IBILI NOT CALCULATED  --  0.5  --   --   --   --   --   --   --    < > = values in this interval not displayed.    Studies Reviewed:  CT HIP RIGHT WO CONTRAST Result Date: 09/25/2023 CLINICAL DATA:  Right hip pain EXAM: CT OF THE RIGHT HIP WITHOUT CONTRAST TECHNIQUE: Multidetector CT imaging of the right hip was performed according to the standard protocol. Multiplanar CT image reconstructions were also generated. RADIATION DOSE REDUCTION: This exam was performed according to the departmental dose-optimization program which includes automated exposure control, adjustment of the mA and/or kV according to patient size and/or use of iterative reconstruction technique. COMPARISON:  CT right femur 08/01/2023 FINDINGS: Bones/Joint/Cartilage  Moderate to prominent  axial and craniocaudad chondral thinning in the right hip associated with substantial spurring of the right femoral head. Mildly aspherical femoral head morphology may predispose to cam type femoroacetabular impingement. Scattered degenerative subcortical cystic lesions along the right femoral head and acetabulum. Scattered lucencies in the marrow of the visualized bony structures including the right iliac bone, ischium, and right proximal femur. Entities such as myeloma are not excluded. Similar lesions have been reported in other bony structures example in cranial structures on the MRI head from 07/26/2023. Ligaments Suboptimally assessed by CT. Muscles and Tendons Moderate regional muscular atrophy. Soft tissues Extending partially along the superficial fascia margin of the gluteus medius and rectus femoris muscle, there is a large fluid collection which extends above and below the margins of today's CT scan. This measures 12.9 by 6.3 cm in cross-section on image 93 of series 5 and was also present on 08/01/2023, tracking cephalad on that exam all the way up to the level of the iliac crests. Appearance favors chronic hematoma or possibly a Morel-Lavalle lesion given the lack of resolution. Right iliac artery atherosclerosis. IMPRESSION: 1. 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 Mildly aspherical femoral head morphology may predispose to cam type femoroacetabular impingement. 2. Scattered lucencies in the marrow of the visualized bony structures including the right iliac bone, ischium, and right proximal femur. Entities such as myeloma are not excluded. Similar lesions have been reported in other bony structures example in cranial structures on the MRI head from 07/26/2023. 3. Large fluid collection extending partially along the superficial fascia margin of the gluteus medius and rectus femoris muscle, also present on 08/01/2023, tracking cephalad on that exam all the way up to the level of the  iliac crests. Appearance favors chronic hematoma or possibly a Morel-Lavallee lesion given the lack of resolution. 4. Moderate regional muscular atrophy. 5. Right iliac artery atherosclerosis. Electronically Signed   By: Ryan Salvage M.D.   On: 09/25/2023 08:00   CT HEAD WO CONTRAST ( ) Result Date: 09/23/2023 EXAM: CT HEAD WITHOUT CONTRAST 09/22/2023 09:56:16 PM TECHNIQUE: CT of the head was performed without the administration of intravenous contrast. Automated exposure control, iterative reconstruction, and/or weight based adjustment of the mA/kV was utilized to reduce the radiation dose to as low as reasonably achievable. COMPARISON: None available. CLINICAL HISTORY: Transient ischemic attack (TIA). FINDINGS: BRAIN AND VENTRICLES: No acute hemorrhage. Gray-white differentiation is preserved. No hydrocephalus. No extra-axial collection. No mass effect or midline shift. Periventricular and scattered subcortical white matter hypoattenuation is moderately advanced for age, stable. Mild generalized atrophy is stable. ORBITS: No acute abnormality. SINUSES: Bilateral mastoid effusions are again noted, right greater than left. Minimal right maxillary sinus disease is again seen. Secretions are present within the right sphenoid sinus. SOFT TISSUES AND SKULL: No acute soft tissue abnormality. No skull fracture. A lytic lesion is again noted within a right greater than left clivus. Multiple smaller lytic lesions are present throughout the calvarium, stable. VASCULATURE: Atherosclerotic calcifications are present in the cavernous carotid arteries bilaterally. No hyperdense vessel is present. IMPRESSION: 1. No acute intracranial abnormality related to the clinical history of TIA. 2. Moderately advanced periventricular and scattered subcortical white matter hypoattenuation, stable. 3. Mild generalized atrophy, stable. 4. Bilateral mastoid effusions, right greater than left, and minimal right maxillary sinus disease,  again seen. Secretions are present within the right sphenoid sinus. 5. Lytic lesion within a right greater than left clivus and multiple smaller lytic lesions throughout the calvarium, stable. Electronically  signed by: Lonni Necessary MD 09/23/2023 05:20 AM EDT RP Workstation: [id number]   DG Humerus Left Result Date: 09/15/2023 CLINICAL DATA:  Myeloma. EXAM: LEFT HUMERUS - 2+ VIEW COMPARISON:  09/01/2023 FINDINGS: Plate and screw fixation of distal humerus. Diffuse bony demineralization of the distal humerus with innumerable cystic lucencies, without significant interval change. Suspected pathologic fracture fixated by a hardware. No new fracture. The proximal humerus is intact. IMPRESSION: Stable radiographic appearance of the distal humerus with plate and screw fixation. Diffusely decreased bone mineralization with innumerable cystic lucencies distally, grossly unchanged from prior. Given provided history of myeloma this is likely due to diffuse myomatous change, although infection is not excluded on imaging findings alone. Electronically Signed   By: Andrea Gasman M.D.   On: 09/15/2023 15:23   IR Removal Tun Cv Cath W/O FL Result Date: 09/09/2023 INDICATION: Dialysis catheter is no longer needed. EXAM: REMOVAL TUNNELED CENTRAL VENOUS CATHETER MEDICATIONS: None ANESTHESIA/SEDATION: None FLUOROSCOPY: None COMPLICATIONS: None immediate. PROCEDURE: Informed written consent was obtained from the patient after a thorough discussion of the procedural risks, benefits and alternatives. All questions were addressed. The patient's right chest and catheter was prepped and draped in a normal sterile fashion and manual traction was used by the IR tech to remove the catheter in its entirety. Direct pressure was held by the IR tech until hemostasis was obtained. A sterile dressing was applied. The patient tolerated the procedure well with no immediate complications. IMPRESSION: Successful catheter removal as  described above. Performed by Laymon Coast, NP Electronically Signed   By: Juliene Balder M.D.   On: 09/09/2023 14:04   DG Swallowing Func-Speech Pathology Result Date: 09/08/2023 Table formatting from the original result was not included. Modified Barium Swallow Study Patient Details Name: Jaydn Moscato. MRN: [id number] Date of Birth: 1965-11-11 Today's Date: 09/08/2023 HPI/PMH: HPI: HPI: 58 y.o. male transferred from [facility] 07/03/23 to [facility] for management of newly diagnosed plasma cell myeloma with weakness and AMS. Pt also with AKI and metabolic encephalopathy. 5/10 transfer to [facility] for iHD.Bedside swallow eval 4/18 wiht recs for NPO due to somnolence. 4/22 Intubated and chemo initiated. 4/23-5/10 CRRT. 5/6 IVIG started. 5/8 trach. 5/12 iHD initiated. 5/18 inability to oxygenate through trach, intubated and trach revision. PMHx:Lt THA, carpal tunnel syndrome, Lt TKA, Lt humerus fx, PAF, HTN, HLD, bipolar disorder, obesity, T2DM. Clinical Impression: Clinical Impression: Patient participated in severely limited MBS this date with max encouragement, demonstrating visible pain and discomfort throughout entire duration though agreeable to singular swallow of thin liquids. During this swallow, visibility was limited due to shoulder positioning, thus unable to fully visualize the airway below the vocal folds. Despite positioning and visibility difficulty, suspect material from single sip was merely penetrated with penetrated materials reflexively leaving the laryngeal vestibule with completion of the swallow. No cough appreciated, which in this case, given results of FEES completed 6/18, may indicate no aspiration. Patient demanded to cease swallow study, refusing any further attempts. Provided education re: inability to visualize swallowing with any solids resulting in inability to recommend any changes to current diet; recommend reinitiation of clear liquids as recommended after FEES 6/18. Will reattempt  MBS in more optimal positioning for patient comfort at next available opportuniity. Factors that may increase risk of adverse event in presence of aspiration Noe & Lianne 2021): Factors that may increase risk of adverse event in presence of aspiration Noe & Lianne 2021): Reduced cognitive function; Frail or deconditioned; Dependence for feeding and/or oral hygiene; Inadequate oral  hygiene; Limited mobility; Poor general health and/or compromised immunity Recommendations/Plan: Swallowing Evaluation Recommendations Swallowing Evaluation Recommendations Recommendations: PO diet PO Diet Recommendation: Clear liquid diet Liquid Administration via: Straw; Cup Medication Administration: Via alternative means Supervision: Full supervision/cueing for swallowing strategies Swallowing strategies  : Hard cough after swallowing Postural changes: Partially reclined for meals (due to comfort) Oral care recommendations: Oral care before ice chips/water  Treatment Plan Treatment Plan Treatment recommendations: Therapy as outlined in treatment plan below Follow-up recommendations: Follow physicians's recommendations for discharge plan and follow up therapies Functional status assessment: Patient has had a recent decline in their functional status and demonstrates the ability to make significant improvements in function in a reasonable and predictable amount of time. Treatment frequency: Min 2x/week Treatment duration: 2 weeks Interventions: Aspiration precaution training; Oropharyngeal exercises; Compensatory techniques; Patient/family education; Trials of upgraded texture/liquids; Diet toleration management by SLP Recommendations Recommendations for follow up therapy are one component of a multi-disciplinary discharge planning process, led by the attending physician.  Recommendations may be updated based on patient status, additional functional criteria and insurance authorization. Assessment: Orofacial Exam: Orofacial Exam  Oral Cavity - Dentition: Dentures, top Orofacial Anatomy: WFL Anatomy: Anatomy: WFL Boluses Administered: Boluses Administered Boluses Administered: Thin liquids (Level 0)  Oral Impairment Domain: Oral Impairment Domain Lip Closure: No labial escape Tongue control during bolus hold: Not tested Oral residue: Complete oral clearance Initiation of pharyngeal swallow : Pyriform sinuses  Pharyngeal Impairment Domain: Pharyngeal Impairment Domain Soft palate elevation: No bolus between soft palate (SP)/pharyngeal wall (PW) Laryngeal elevation: Complete superior movement of thyroid cartilage with complete approximation of arytenoids to epiglottic petiole Anterior hyoid excursion: Complete anterior movement Epiglottic movement: Complete inversion Laryngeal vestibule closure: Incomplete, narrow column air/contrast in laryngeal vestibule Pharyngeal stripping wave : Present - complete Pharyngoesophageal segment opening: -- (unable to visualize due to positioning) Tongue base retraction: Trace column of contrast or air between tongue base and PPW Pharyngeal residue: Complete pharyngeal clearance  Esophageal Impairment Domain: No data recorded Pill: No data recorded Penetration/Aspiration Scale Score: Penetration/Aspiration Scale Score 2.  Material enters airway, remains ABOVE vocal cords then ejected out: Thin liquids (Level 0) Compensatory Strategies: Compensatory Strategies Compensatory strategies: No   General Information: Caregiver present: No  Diet Prior to this Study: NPO   Temperature : Normal   Respiratory Status: WFL   Supplemental O2: None (Room air)   History of Recent Intubation: No  Behavior/Cognition: Alert; Cooperative Self-Feeding Abilities: Dependent for feeding Baseline vocal quality/speech: Normal Volitional Cough: -- (unable to test) Volitional Swallow: Able to elicit Exam Limitations: Poor positioning; Limited visibility; Poor participation; Poor bolus acceptance Goal Planning: Prognosis for improved  oropharyngeal function: Good Barriers to Reach Goals: Cognitive deficits No data recorded Patient/Family Stated Goal: none stated, no family present No data recorded Pain: Pain Assessment Pain Assessment: Faces Faces Pain Scale: 10 Breathing: 0 Negative Vocalization: 0 Facial Expression: 1 Body Language: 2 Consolability: 1 PAINAD Score: 4 Pain Location: back End of Session: Start Time:SLP Start Time (ACUTE ONLY): 0855 Stop Time: SLP Stop Time (ACUTE ONLY): 0910 Time Calculation:SLP Time Calculation (min) (ACUTE ONLY): 15 min Charges: SLP Evaluations $ SLP Speech Visit: 1 Visit SLP Evaluations $MBS Swallow: 1 Procedure $Swallowing Treatment: 1 Procedure SLP visit diagnosis: SLP Visit Diagnosis: Dysphagia, oropharyngeal phase (R13.12) Past Medical History: Past Medical History: Diagnosis Date  Anxiety   Arthritis   Asthma   Bipolar disorder (HCC)   Current every day smoker   Depression   Diabetes mellitus, type II (HCC)   Dyspnea  History of kidney stones   History of pneumonia   Hyperlipidemia   Hypertension   Morbid obesity (HCC)   Sedentary lifestyle   Seizures (HCC)   last seizure 12 yrs ago, no current problem  Sleep apnea   uses CPAP nightly Past Surgical History: Past Surgical History: Procedure Laterality Date  CARDIAC CATHETERIZATION  2020  carpal tunel Left   COLONOSCOPY    HARDWARE REMOVAL Left 07/26/2022  Procedure: HARDWARE REMOVAL ELBOW;  Surgeon: [name], MD;  Location: [facility] OR;  Service: Orthopedics;  Laterality: Left;  IR FLUORO GUIDE CV LINE RIGHT  08/07/2023  IR FLUORO GUIDE CV LINE RIGHT  09/01/2023  IR GASTROSTOMY TUBE MOD SED  08/14/2023  IR REMOVAL TUN CV CATH W/O FL  08/29/2023  IR US  GUIDE VASC ACCESS RIGHT  08/07/2023  IR US  GUIDE VASC ACCESS RIGHT  09/01/2023  ORIF HUMERUS FRACTURE Left 01/16/2022  Procedure: OPEN REDUCTION INTERNAL FIXATION (ORIF) DISTAL HUMERUS FRACTURE;  Surgeon: [name], MD;  Location: [facility] OR;  Service: Orthopedics;  Laterality: Left;  ORIF HUMERUS FRACTURE Left  01/29/2023  Procedure: OPEN REDUCTION INTERNAL FIXATION (ORIF) DISTAL HUMERUS FRACTURE;  Surgeon: [name], MD;  Location: [facility] OR;  Service: Orthopedics;  Laterality: Left;  OTHER SURGICAL HISTORY    R & L shoulder  OTHER SURGICAL HISTORY Right   Knee surgery x several  TOTAL HIP ARTHROPLASTY Left 12/26/2017  Procedure: LEFT TOTAL HIP ARTHROPLASTY ANTERIOR APPROACH;  Surgeon: Vernetta Lonni GRADE, MD;  Location: [facility] ORS;  Service: Orthopedics;  Laterality: Left;  TOTAL KNEE ARTHROPLASTY Left 11/23/2021  Procedure: LEFT TOTAL KNEE ARTHROPLASTY;  Surgeon: Vernetta Lonni GRADE, MD;  Location: [facility];  Service: Orthopedics;  Laterality: Left; Rosina DELENA Downy 09/08/2023, 1:14 PM  IR Fluoro Guide CV Line Right Result Date: 09/01/2023 CLINICAL DATA:  End-stage renal disease in need for new tunneled hemodialysis catheter. Status post removal previously placed catheter on 08/29/2023 due to bacteremia. EXAM: TUNNELED CENTRAL VENOUS HEMODIALYSIS CATHETER PLACEMENT WITH ULTRASOUND AND FLUOROSCOPIC GUIDANCE ANESTHESIA/SEDATION: Moderate (conscious) sedation was employed during this procedure. A total of Versed  1.5 mg and Fentanyl  50 mcg was administered intravenously. Moderate Sedation Time: 24 minutes. The patient's level of consciousness and vital signs were monitored continuously by radiology nursing throughout the procedure under my direct supervision. MEDICATIONS: 2 g IV Ancef . FLUOROSCOPY: Radiation Exposure Index: 2.1 mGy Kerma PROCEDURE: The procedure, risks, benefits, and alternatives were explained to the patient. Questions regarding the procedure were encouraged and answered. The patient understands and consents to the procedure. A timeout was performed prior to initiating the procedure. The right neck and chest were prepped with chlorhexidine  in a sterile fashion, and a sterile drape was applied covering the operative field. Maximum barrier sterile technique with sterile gowns and gloves were used for the  procedure. Local anesthesia was provided with 1% lidocaine . Ultrasound was performed to confirm patency of the right internal jugular vein. An ultrasound image was saved and recorded. After creating a small venotomy incision, a 21 gauge needle was advanced into the right internal jugular vein under direct, real-time ultrasound guidance. After securing guidewire access, an 8 Fr dilator was placed. A J-wire was kinked to measure appropriate catheter length. A Palindrome tunneled hemodialysis catheter measuring 19 cm from tip to cuff was chosen for placement. This was tunneled in a retrograde fashion from the chest wall to the venotomy incision. At the venotomy, serial dilatation was performed and a 15 Fr peel-away sheath was placed over a guidewire. The catheter  was then placed through the sheath and the sheath removed. Final catheter positioning was confirmed and documented with a fluoroscopic spot image. The catheter was aspirated, flushed with saline, and injected with appropriate volume heparin  dwells. The venotomy incision was closed with subcutaneous and subcuticular 4-0 Vicryl. Dermabond was applied to the incision. The catheter exit site was secured with 0-Prolene retention sutures. COMPLICATIONS: None.  No pneumothorax. FINDINGS: After catheter placement, the tip lies in the right atrium. The catheter aspirates normally and is ready for immediate use. IMPRESSION: Placement of tunneled hemodialysis catheter via the right internal jugular vein. The catheter tip lies in the right atrium. The catheter is ready for immediate use. Electronically Signed   By: Marcey Moan M.D.   On: 09/01/2023 12:16   IR US  Guide Vasc Access Right Result Date: 09/01/2023 CLINICAL DATA:  End-stage renal disease in need for new tunneled hemodialysis catheter. Status post removal previously placed catheter on 08/29/2023 due to bacteremia. EXAM: TUNNELED CENTRAL VENOUS HEMODIALYSIS CATHETER PLACEMENT WITH ULTRASOUND AND  FLUOROSCOPIC GUIDANCE ANESTHESIA/SEDATION: Moderate (conscious) sedation was employed during this procedure. A total of Versed  1.5 mg and Fentanyl  50 mcg was administered intravenously. Moderate Sedation Time: 24 minutes. The patient's level of consciousness and vital signs were monitored continuously by radiology nursing throughout the procedure under my direct supervision. MEDICATIONS: 2 g IV Ancef . FLUOROSCOPY: Radiation Exposure Index: 2.1 mGy Kerma PROCEDURE: The procedure, risks, benefits, and alternatives were explained to the patient. Questions regarding the procedure were encouraged and answered. The patient understands and consents to the procedure. A timeout was performed prior to initiating the procedure. The right neck and chest were prepped with chlorhexidine  in a sterile fashion, and a sterile drape was applied covering the operative field. Maximum barrier sterile technique with sterile gowns and gloves were used for the procedure. Local anesthesia was provided with 1% lidocaine . Ultrasound was performed to confirm patency of the right internal jugular vein. An ultrasound image was saved and recorded. After creating a small venotomy incision, a 21 gauge needle was advanced into the right internal jugular vein under direct, real-time ultrasound guidance. After securing guidewire access, an 8 Fr dilator was placed. A J-wire was kinked to measure appropriate catheter length. A Palindrome tunneled hemodialysis catheter measuring 19 cm from tip to cuff was chosen for placement. This was tunneled in a retrograde fashion from the chest wall to the venotomy incision. At the venotomy, serial dilatation was performed and a 15 Fr peel-away sheath was placed over a guidewire. The catheter was then placed through the sheath and the sheath removed. Final catheter positioning was confirmed and documented with a fluoroscopic spot image. The catheter was aspirated, flushed with saline, and injected with appropriate  volume heparin  dwells. The venotomy incision was closed with subcutaneous and subcuticular 4-0 Vicryl. Dermabond was applied to the incision. The catheter exit site was secured with 0-Prolene retention sutures. COMPLICATIONS: None.  No pneumothorax. FINDINGS: After catheter placement, the tip lies in the right atrium. The catheter aspirates normally and is ready for immediate use. IMPRESSION: Placement of tunneled hemodialysis catheter via the right internal jugular vein. The catheter tip lies in the right atrium. The catheter is ready for immediate use. Electronically Signed   By: Marcey Moan M.D.   On: 09/01/2023 12:16   DG Humerus Left Result Date: 09/01/2023 CLINICAL DATA:  Fracture.  Status post ORIF. EXAM: LEFT HUMERUS - 2+ VIEW COMPARISON:  08/05/2023 FINDINGS: Similar markedly abnormal appearance to the distal humerus status post cortical plate  and screw fixation. Study is limited by positioning and as before, bony anatomy is markedly demineralized with possible fragmentation of the distal humerus at the elbow in areas of substantial lucency. IMPRESSION: Limited assessment shows no substantial interval change in fixation hardware although direct comparison between the two studies is difficult due to differential positioning and markedly abnormal appearance of the distal humerus. As noted on the previous exam, given the marked loss of mineralized bone, infection is not excluded. Electronically Signed   By: Camellia Candle M.D.   On: 09/01/2023 12:05   IR Removal Tun Cv Cath W/O FL Result Date: 08/29/2023 INDICATION: Bacteremia with request for removal of tunneled catheter for line holiday. EXAM: REMOVAL OF TUNNELED HEMODIALYSIS CATHETER MEDICATIONS: None COMPLICATIONS: None immediate. PROCEDURE: Informed written consent was obtained from the patient following an explanation of the procedure, risks, benefits and alternatives to treatment. A time out was performed prior to the initiation of the  procedure. Alcohol  was used to prep the patient's right neck, chest and existing catheter (patient has allergy to ChloraPrep and Betadine ). Using only gentle traction the catheter was removed intact. Hemostasis was obtained with manual compression. A dressing was placed. The patient tolerated the procedure well without immediate post procedural complication. IMPRESSION: Successful removal of tunneled dialysis catheter. Procedure performed by: Sari Lamp, PA-C Electronically Signed   By: Marcey Moan M.D.   On: 08/29/2023 16:11

## 2023-09-25 NOTE — Progress Notes (Signed)
 PROGRESS NOTE    Frank [name].  FMW:[id number] DOB: 1965-09-15 DOA: 07/03/2023 PCP: [name], MD   Brief Narrative: 58 year old PMH OSA CPAP, pathological fracture and multiple ORIF left Humerus likely due to multiple myeloma, A-fib not on anticoagulation, hyperlipidemia, tobacco use disorder who was feeling weak for 1 week, admitted to [facility] with concern of sepsis, community-acquired pneumonia and acute encephalopathy. Patient was found to have lytic lesions, he was transferred to [facility] for oncology treatment. In the meantime he developed encephalopathy, acute renal failure. He remained in the ICU for 36 days. Underwent tracheostomy. He was also started on HD after CRRT. Subsequently PEG tube placed 5/29.   Significant events 4/17-Admitted to medical floor with oncology consultation to treat multiple myeloma. 4/19-transferred to ICU intubated, encephalopathic. 4/20-A-fib with RVR. Started heparin . Groin and biopsy site hematoma requiring multiple transfusions. EEG 4/25 >> moderate to severe encephalopathy without any seizures or epileptiform discharges. 4/29 Received Velcade  and Cytoxan  chemotherapy-intent palliative? 5/2 LP and ETT exchange  5/8 Trach placed. Some post op bleeding improved with thrombi-pad 5/10 Voriconazole  added back based on CT Chest findings 5/13 more alert and able to follow some commands. 5/23, transferred to floor. 5/29-PEG placed 5/31 chemotherapy changed to Sarclisa /Kyprolis /Cytoxan . 6/13 plan to remove HD cathter for holiday line 6/16 new HD catheter placed 6/23 has not required HD for a week, HD catheter removed 6/28 finished first cycle of chemo, next cycle to start in 2 weeks   Assessment & Plan:   Principal Problem:   Severe sepsis with septic shock (HCC) Active Problems:   Aspergillosis, unspecified (HCC)   Multiple myeloma not having achieved remission (HCC)   Lobar pneumonia, unspecified organism (HCC)   Closed fracture  of left distal humerus   AKI (acute kidney injury) (HCC)   Acute hypoxemic respiratory failure (HCC)   PAF (paroxysmal atrial fibrillation) (HCC)   Acute metabolic encephalopathy   Protein-calorie malnutrition, severe   Hematoma of right thigh   Seizure disorder (HCC)   DMII (diabetes mellitus, type 2) (HCC)   Pressure injury of skin   CAD (coronary artery disease)   Chronic diastolic CHF (congestive heart failure) (HCC)   CKD (chronic kidney disease), stage IIIa (HCC)   Class 1 obesity   Asthma   Bipolar disorder (HCC)   Febrile neutropenia (HCC)   Generalized weakness   Anemia  Recurrent fevers, sepsis, Staph Epi Bacteremia - -Hospital course complicated by intermittent fevers, ID consulted and most recently evaluated patient 6/17.   -He had a fever and MRSE bacteremia 1/4 bottles, completed a course of vancomycin  with the end date 6/20.  He did receive a line holiday.  Repeat blood culture 6/9 without growth. - He has unstageable sacral ulcer, continue local wound care.     Aspergillosis with pneumonia -On Voriconazole  per ID, On Acyclovir .  End date for voriconazole  is 8/5, continue.   Right leg numbness -patient also weak, but that is more generalized due to being in bed for almost 3 months.   -CT scan without acute findings. MRI order. He can't tolerate MRI without sedation. Will hold for now. He was recently intubated and required trach this hospitalization.  -Hip CT: showed large hematoma gluteus medius and rectus femoris muscle   will repeat CT head.   Multiple myeloma -oncology following. Started on Velcade  and Cytoxan  by Oncology, transitioned to Kyprolis  then Sarclisa .   - Sarclisa  per Oncology - Next dose chemo due ~7/14   Goals of care - Unfortunately  poor long-term prognosis. Palliative care has discussed with wife previously.  Remain full code on full scope of care.  Awaiting SNF placement but he needs to be able to transfer better and be able to sit up in the  chair for outpatient chemo which he is unable to do currently   Acute Hypoxic Respiratory Failure -  trach removed 6/11.  Respiratory status stable.   Right Thigh Hematoma - per CT on 08/01/23: 12.1 x 30.2 x 7.6 cm subcutaneous hematoma within the anterior right thigh. 8.4 x 10.4 x 22.2 cm subcutaneous hematoma within the right gluteal region - continue supportive care.  Hemoglobin stable  Ortho consulted, repeat CT hip showed persistent large fluid collection. Otho consulted, recommend drain placement by IR>   Severe acute metabolic encephalopathy with hypoactive delirium, seizure disorder - Normal MRI of the brain.  LP negative for meningitis or encephalitis.  EEG negative for seizures but encephalopathy. Home Depakote  discontinued in ICU.   Insulin -dependent diabetes type 2 with hyperglycemia- A1c 6.0, continue with sliding scale insulin .  Hypoglycemia now resolved   AKI on CKD stage IIIa, Uremia,  Hyponatremia, Hypokalemia -Due to MM, hypercalcemia and ACE inhibitors?  Nephrology consulted, was on hemodialysis but eventually he tolerated being off HD, tunneled catheter discontinued per nephrology.  Renal function is stable   Paroxysmal A-fib - Unable to  anticoagulation due to hematoma.  Rate controlled   Obstructive Sleep Apnea -Trach removed   Anxiety/Bipolar disorder - Continue with Paxil .     Severe Malnutrition - Continue tube feeding.  He is allowed dysphagia 1 per speech   History of Pathological fracture and multiple ORIF left Humerus 01/2023  -Dr. Jonel discussed with orthopedics on 6/26, options would involve either a substantial reconstruction with antibiotic cement and prolonged, difficult recovery, or above elbow amputation.  [name] recommends more rehabilitation at this time prior to attempting surgery. - Maintain left arm splint when out of bed - [name] following, since he is not clear when he will discharge likely to have surgery here in couple of weeks.  Date to  be determined   Hypokalemia -potassium now normalized.  Hyponatremia -stable.    See wound care documentation below.  Pressure Injury 07/05/23 Heel Left;Right Unstageable - Full thickness tissue loss in which the base of the injury is covered by slough (yellow, tan, gray, green or brown) and/or eschar (tan, brown or black) in the wound bed. (Active)  07/05/23 1108  Location: Heel  Location Orientation: Left;Right  Staging: Unstageable - Full thickness tissue loss in which the base of the injury is covered by slough (yellow, tan, gray, green or brown) and/or eschar (tan, brown or black) in the wound bed.  Wound Description (Comments):   DO NOT USE:  Present on Admission: Yes  Dressing Type Foam - Lift dressing to assess site every shift 09/24/23 2145     Pressure Injury 07/11/23 Sacrum Mid Unstageable - Full thickness tissue loss in which the base of the injury is covered by slough (yellow, tan, gray, green or brown) and/or eschar (tan, brown or black) in the wound bed. (Active)  07/11/23 1600  Location: Sacrum  Location Orientation: Mid  Staging: Unstageable - Full thickness tissue loss in which the base of the injury is covered by slough (yellow, tan, gray, green or brown) and/or eschar (tan, brown or black) in the wound bed.  Wound Description (Comments):   DO NOT USE:  Present on Admission: No  Dressing Type Foam - Lift dressing to  assess site every shift 09/25/23 0630     Pressure Injury 07/27/23 Buttocks Right;Lower (Active)  07/27/23 1700  Location: Buttocks  Location Orientation: Right;Lower  Staging:   Wound Description (Comments):   DO NOT USE:  Present on Admission: Yes  Dressing Type Foam - Lift dressing to assess site every shift 09/23/23 0800     Pressure Injury 08/25/23 Buttocks Left Stage 2 -  Partial thickness loss of dermis presenting as a shallow open injury with a red, pink wound bed without slough. (Active)  08/25/23   Location: Buttocks  Location Orientation:  Left  Staging: Stage 2 -  Partial thickness loss of dermis presenting as a shallow open injury with a red, pink wound bed without slough.  Wound Description (Comments):   DO NOT USE:  Present on Admission: No  Dressing Type Foam - Lift dressing to assess site every shift 09/25/23 0630     Nutrition Problem: Severe Malnutrition Etiology: acute illness (prolonged illness, interuptions in feeding)    Signs/Symptoms: moderate fat depletion, moderate muscle depletion, percent weight loss (9.6% x 1 month) Percent weight loss: 9.6 %    Interventions: Prostat, Tube feeding, MVI  Estimated body mass index is 29.36 kg/m as calculated from the following:   Height as of this encounter: 6' 0.01 (1.829 m).   Weight as of this encounter: 98.2 kg.   DVT prophylaxis: Lovenox .  Code Status: Full code Family Communication: Disposition Plan:  Status is: Inpatient Remains inpatient appropriate because: needs to be able sit up to be able to go out patient HD    Consultants:  Oncology Nephrology  Orthopedic.    Procedures:  HD Chemotherapy  Antimicrobials:    Subjective: He can't have MRI unless he is out.  He is complaining of right hip pain.   Objective: Vitals:   09/24/23 2017 09/24/23 2332 09/25/23 0329 09/25/23 0500  BP: (!) 127/99 132/82 129/85   Pulse: (!) 108 62 96   Resp: 19 16 17    Temp: 98.6 F (37 C) 98.6 F (37 C) 99.2 F (37.3 C)   TempSrc: Oral Oral Axillary   SpO2: 96% 94% 94%   Weight:    98.2 kg  Height:        Intake/Output Summary (Last 24 hours) at 09/25/2023 0713 Last data filed at 09/24/2023 2150 Gross per 24 hour  Intake 900 ml  Output 2500 ml  Net -1600 ml   Filed Weights   09/23/23 1453 09/24/23 0500 09/25/23 0500  Weight: 97 kg 97.4 kg 98.2 kg    Examination:  General exam: NAD Respiratory system: CTA Cardiovascular system: S 1, S 2 RRR Gastrointestinal system: BS present, soft, nt Central nervous system: alert, oriented, follows  command.  Extremities: trace edema    Data Reviewed: I have personally reviewed following labs and imaging studies  CBC: Recent Labs  Lab 09/19/23 1003 09/20/23 0500 09/22/23 1030 09/23/23 0821 09/24/23 0806  WBC 5.2 3.9* 6.6 4.5 5.4  NEUTROABS  --   --  3.6 2.6 3.1  HGB 8.6* 8.8* 8.7* 8.5* 8.8*  HCT 26.4* 25.2* 25.4* 25.9* 25.9*  MCV 107.8* 106.8* 107.6* 107.9* 107.9*  PLT 222 245 266 275 254   Basic Metabolic Panel: Recent Labs  Lab 09/19/23 1003 09/20/23 0500 09/21/23 0330 09/23/23 0821 09/23/23 0822 09/24/23 0806 09/24/23 0809  NA 134*  133* 130* 129* 132* 133* 130* 130*  K 4.1  4.1 4.4 4.1 4.0 4.1 4.1 4.1  CL 101  101 99 99 102 102  98 99  CO2 23  23 22 22  21* 25 22 22   GLUCOSE 135*  134* 87 129* 143* 141* 123* 120*  BUN 54*  55* 54* 56* 43* 45* 41* 41*  CREATININE 1.00  1.08 0.95 0.95 0.94 0.94 0.98 1.01  CALCIUM  8.1*  7.9* 8.2* 8.4* 8.2* 8.3* 8.2* 8.3*  MG 2.2 2.2  --  2.0  --  1.9  --   PHOS 4.3 4.0 4.1  --  4.2  --  4.6   GFR: Estimated Creatinine Clearance: 95.6 mL/min (by C-G formula based on SCr of 1.01 mg/dL). Liver Function Tests: Recent Labs  Lab 09/19/23 1003 09/20/23 0500 09/21/23 0330 09/23/23 0821 09/23/23 0822 09/24/23 0806 09/24/23 0809  AST 15  --   --  11*  --  12*  --   ALT 10  --   --  8  --  7  --   ALKPHOS 106  --   --  93  --  84  --   BILITOT 0.5  --   --  0.4  --  0.4  --   PROT 5.2*  --   --  5.2*  --  5.3*  --   ALBUMIN  2.1*  2.1*   < > 2.0* 2.0* 2.0* 2.1* 2.1*   < > = values in this interval not displayed.   No results for input(s): LIPASE, AMYLASE in the last 168 hours. No results for input(s): AMMONIA in the last 168 hours. Coagulation Profile: No results for input(s): INR, PROTIME in the last 168 hours. Cardiac Enzymes: No results for input(s): CKTOTAL, CKMB, CKMBINDEX, TROPONINI in the last 168 hours. BNP (last 3 results) No results for input(s): PROBNP in the last 8760  hours. HbA1C: No results for input(s): HGBA1C in the last 72 hours. CBG: Recent Labs  Lab 09/24/23 0606 09/24/23 1101 09/24/23 1631 09/24/23 2122 09/25/23 0617  GLUCAP 76 130* 84 181* 76   Lipid Profile: No results for input(s): CHOL, HDL, LDLCALC, TRIG, CHOLHDL, LDLDIRECT in the last 72 hours. Thyroid Function Tests: No results for input(s): TSH, T4TOTAL, FREET4, T3FREE, THYROIDAB in the last 72 hours. Anemia Panel: No results for input(s): VITAMINB12, FOLATE, FERRITIN, TIBC, IRON, RETICCTPCT in the last 72 hours. Sepsis Labs: No results for input(s): PROCALCITON, LATICACIDVEN in the last 168 hours.  No results found for this or any previous visit (from the past 240 hours).       Radiology Studies: No results found.       Scheduled Meds:  acyclovir   400 mg Oral BID   collagenase    Topical Daily   enoxaparin  (LOVENOX ) injection  50 mg Subcutaneous Daily   feeding supplement  237 mL Oral BID WC   feeding supplement (PROSource TF20)  60 mL Per Tube BID   free water   50 mL Per Tube Daily   insulin  aspart  0-15 Units Subcutaneous TID AC & HS   insulin  aspart  2 Units Subcutaneous TID AC & HS   insulin  glargine-yfgn  10 Units Subcutaneous Q24H   montelukast   5 mg Oral QHS   multivitamin with minerals  1 tablet Oral Daily   nutrition supplement (JUVEN)  1 packet Oral BID BM   mouth rinse  15 mL Mouth Rinse 4 times per day   PARoxetine   40 mg Oral Daily   polyethylene glycol  17 g Oral BID   senna  1 tablet Oral Daily   sodium chloride  flush  10-40 mL Intracatheter Q12H  umeclidinium-vilanterol  1 puff Inhalation Daily   valproic  acid  750 mg Oral BID   voriconazole   400 mg Oral Q12H   Continuous Infusions:  sodium chloride  Stopped (08/28/23 1208)   sodium chloride  Stopped (08/28/23 0839)     LOS: 84 days    Time spent: 35 minutes    Amyra Vantuyl A Calub Tarnow, MD [facility]   If 7PM-7AM, please contact  night-coverage [email or web address]  09/25/2023, 7:13 AM

## 2023-09-25 NOTE — Progress Notes (Signed)
 Occupational Therapy Treatment Patient Details Name: Frank [name]. MRN: [id number] DOB: 1965-09-20 Today's Date: 09/25/2023   History of present illness 58 y.o. male transferred from [facility] 07/03/23 to [facility] for management of newly diagnosed plasma cell myeloma with weakness and AMS. Pt also with AKI and metabolic encephalopathy. 5/10 transfer to [facility] for iHD. 4/22 Intubated and chemo initiated. 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 5/6 IVIG started. 5/8-6/11 trach. 5/12 iHD initiated. 5/29 PEG placed. 6/5 chemo initiated. PMHx:Lt THA, carpal tunnel syndrome, Lt TKA, Lt humerus fx, PAF, HTN, HLD, bipolar disorder, obesity, T2DM   OT comments  [facility] Patient OT/PT session: Patient in supine with family present. Patient continues to be a max assist of one for supine to sitting on EOB with increased time due to pain and use of bed pad to assist. Patient able to perform 2 stands from EOB into [place] with max assist +2 with use of bed pad with hips and increased standing tolerance on first attempt. Patient required assist of 2 to return to supine and was positioned for comfort. Acute OT to continue to follow with [facility] to progress with bed mobility and transfers.       If plan is discharge home, recommend the following:  Two people to help with walking and/or transfers;Assistance with cooking/housework;Assistance with feeding;Direct supervision/assist for medications management;Direct supervision/assist for financial management;Assist for transportation;Help with stairs or ramp for entrance;A lot of help with bathing/dressing/bathroom   Equipment Recommendations  Other (comment) (defer)    Recommendations for Other Services      Precautions / Restrictions Precautions Precautions: Fall Recall of Precautions/Restrictions: Impaired Precaution/Restrictions Comments: episode of syncope, sacral wound, chemo. Lt humerus non-union, splinted. no elbow ROM, but shoulder ROM ok per secure chat with Dr.  Jonel 7/1 Required Braces or Orthoses: Sling Restrictions Weight Bearing Restrictions Per Provider Order: Yes LUE Weight Bearing Per Provider Order: Non weight bearing Other Position/Activity Restrictions: Lt humerus non-union, splinted. no elbow ROM, but shoulder ROM ok per secure chat with Dr. Jonel 7/1       Mobility Bed Mobility Overal bed mobility: Needs Assistance Bed Mobility: Rolling, Supine to Sit, Sit to Sidelying Rolling: Max assist   Supine to sit: Max assist, HOB elevated, Used rails   Sit to sidelying: Max assist, +2 for physical assistance General bed mobility comments: increased time and use of bed pad to assist.    Transfers Overall transfer level: Needs assistance Equipment used: Ambulation equipment used Transfers: Sit to/from Stand Sit to Stand: Max assist, +2 physical assistance, +2 safety/equipment           General transfer comment: 2 stands performed from EOB into [place] with max assist +2 using bed pads to assist with hips Transfer via Lift Equipment: [place]   Balance Overall balance assessment: Needs assistance Sitting-balance support: Single extremity supported, Feet supported Sitting balance-Leahy Scale: Fair Sitting balance - Comments: CGA to min assist for safety Postural control: Right lateral lean Standing balance support: Single extremity supported, During functional activity, Reliant on assistive device for balance Standing balance-Leahy Scale: Zero Standing balance comment: stood x2 into [place] with max assist +2                           ADL either performed or assessed with clinical judgement   ADL Overall ADL's : Needs assistance/impaired     Grooming: Wash/dry hands;Wash/dry face;Contact guard assist;Sitting Grooming Details (indicate cue type and reason): on EOB  Upper Body Dressing : Moderate assistance;Sitting Upper Body Dressing Details (indicate cue type and reason): donned gown and max assist for  sling                   General ADL Comments: focused on bed mobility, sitting balance, and standing in [place]    Extremity/Trunk Assessment Upper Extremity Assessment Upper Extremity Assessment: Generalized weakness RUE Sensation: WNL LUE Deficits / Details: Lt humerus non-union, splinted. no elbow ROM, but shoulder ROM ok per secure chat with Dr. Jonel 7/1 LUE Sensation: decreased light touch LUE Coordination: decreased fine motor;decreased gross motor            Vision       Perception     Praxis     Communication Communication Communication: Impaired Factors Affecting Communication: Reduced clarity of speech   Cognition Arousal: Alert Behavior During Therapy: WFL for tasks assessed/performed Cognition: Cognition impaired     Awareness: Intellectual awareness intact   Attention impairment (select first level of impairment): Sustained attention Executive functioning impairment (select all impairments): Problem solving                   Following commands: Impaired Following commands impaired: Follows one step commands with increased time      Cueing   Cueing Techniques: Verbal cues, Tactile cues, Visual cues  Exercises      Shoulder Instructions       General Comments Patient's family present during visit and supportive    Pertinent Vitals/ Pain       Pain Assessment Pain Assessment: Faces Faces Pain Scale: Hurts whole lot Pain Location: RLE knee and LUE shoulder Pain Descriptors / Indicators: Sore, Discomfort, Grimacing, Numbness Pain Intervention(s): Limited activity within patient's tolerance, Monitored during session, Repositioned  Home Living                                          Prior Functioning/Environment              Frequency  Min 1X/week        Progress Toward Goals  OT Goals(current goals can now be found in the care plan section)  Progress towards OT goals: Progressing toward  goals  Acute Rehab OT Goals Patient Stated Goal: to go home OT Goal Formulation: With patient Time For Goal Achievement: 09/25/23 Potential to Achieve Goals: Fair ADL Goals Pt Will Perform Grooming: with supervision;with set-up;sitting Pt Will Perform Upper Body Dressing: with contact guard assist;with supervision;sitting Pt Will Transfer to Toilet: with max assist;with mod assist;stand pivot transfer;bedside commode Pt/caregiver will Perform Home Exercise Program: Right Upper extremity;Increased strength;With written HEP provided;Independently Additional ADL Goal #1: Pt will complete bed mobility mod A  to sit EOB in prep for ADLs/selfcare tasks  Plan      Co-evaluation    PT/OT/SLP Co-Evaluation/Treatment: Yes Reason for Co-Treatment: For patient/therapist safety;To address functional/ADL transfers PT goals addressed during session: Mobility/safety with mobility;Balance OT goals addressed during session: ADL's and self-care      AM-PAC OT 6 Clicks Daily Activity     Outcome Measure   Help from another person eating meals?: A Little Help from another person taking care of personal grooming?: A Little Help from another person toileting, which includes using toliet, bedpan, or urinal?: Total Help from another person bathing (including washing, rinsing, drying)?: A Lot Help from another person to put on  and taking off regular upper body clothing?: A Lot Help from another person to put on and taking off regular lower body clothing?: Total 6 Click Score: 12    End of Session Equipment Utilized During Treatment: Gait belt;Other (comment) Laurent)  OT Visit Diagnosis: Unsteadiness on feet (R26.81);Other abnormalities of gait and mobility (R26.89);Muscle weakness (generalized) (M62.81);Pain Pain - Right/Left: Right Pain - part of body: Knee;Arm (left arm)   Activity Tolerance Patient tolerated treatment well;Patient limited by pain   Patient Left in bed;with call bell/phone  within reach;with family/visitor present   Nurse Communication Mobility status        Time: 8682-8644 OT Time Calculation (min): 38 min  Charges: OT General Charges $OT Visit: 1 Visit OT Treatments $Self Care/Home Management : 8-22 mins $Therapeutic Activity: 8-22 mins  [name], OTA Acute Rehabilitation Services  Office [phone]   [name] 09/25/2023, 2:19 PM

## 2023-09-25 NOTE — Plan of Care (Signed)
   Problem: Education: Goal: Knowledge of General Education information will improve Description: Including pain rating scale, medication(s)/side effects and non-pharmacologic comfort measures Outcome: Progressing   Problem: Health Behavior/Discharge Planning: Goal: Ability to manage health-related needs will improve Outcome: Not Progressing   Problem: Clinical Measurements: Goal: Ability to maintain clinical measurements within normal limits will improve Outcome: Progressing

## 2023-09-25 NOTE — Progress Notes (Signed)
 Patient ID: Frank Jama Desiderio Mickey., male   DOB: 03-06-1966, 58 y.o.   MRN: [id number]   LOS: 84 days   Subjective: Asked to see pt regarding right hip pain and extension of known hematoma seen on CT. He notes the hip pain has been worse lately and also describes numbness in leg.   Objective: Vital signs in last 24 hours: Temp:  [97.5 F (36.4 C)-99.2 F (37.3 C)] 97.5 F (36.4 C) (07/10 1100) Pulse Rate:  [62-108] 102 (07/10 1100) Resp:  [16-20] 18 (07/10 1100) BP: (108-143)/(60-101) 143/101 (07/10 1100) SpO2:  [94 %-98 %] 98 % (07/10 1100) Weight:  [98.2 kg] 98.2 kg (07/10 0500) Last BM Date : 09/23/23   Laboratory  CBC Recent Labs    09/23/23 0821 09/24/23 0806  WBC 4.5 5.4  HGB 8.5* 8.8*  HCT 25.9* 25.9*  PLT 275 254   BMET Recent Labs    09/24/23 0806 09/24/23 0809  NA 130* 130*  K 4.1 4.1  CL 98 99  CO2 22 22  GLUCOSE 123* 120*  BUN 41* 41*  CREATININE 0.98 1.01  CALCIUM  8.2* 8.3*     Physical Exam General appearance: alert and no distress RLE -- Induration from hip down to distal 1/3 thigh laterally. NT.    Assessment/Plan: Right hip/thigh hematoma -- Went over options with pt. Am unsure this is causing his pain but certainly could be. Given length of time hopefully this has liquefied and will be amenable to IR drainage. Recommend leaving hemovac in place.    Ozell DOROTHA Ned, PA-C Orthopedic Surgery [phone] 09/25/2023

## 2023-09-25 NOTE — Consult Note (Signed)
 Chief Complaint: R hip fluid collection  Referring Provider(s): Montano,Emil   Supervising Physician: Jenna Hacker  Patient Status: [facility] - In-pt  History of Present Illness: Frank [name]. is a 58 y.o. male with past medical history significant for OSA, uses CPAP, multiple myeloma, A-fib (not on AC), hyperlipidemia, tobacco use disorder.  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 He was admitted to an outside hospital with concern for sepsis, pneumonia, encephalopathy after feeling weak for 1 week.  He developed renal failure while admitted in the ICU for 36 days.  His care required tracheostomy, HD, PEG tube placement.  Currently the patient is not requiring ventilator (room air) or hemodialysis.  While admitted patient developed right leg numbness and pain.  CT of the hip revealed a large fluid collection intermuscularly (gluteus medius and rectus femoris).  Ortho was consulted and recommended IR aspiration and drain placement.   Patient will be n.p.o. at midnight for planned procedure on 7/11.  Denies fever, chills, SOB, CP, sore throat, N/V, abd pain, blood in stool or urine, abnormal bruising.  He endorses right hip pain, right leg numbness from the hip to his calf, left upper extremity pain.  He has not noticed any externally draining wounds over the right hip.  Allergies Reviewed:  Morphine  and codeine, Shellfish allergy, Betadine  [povidone iodine ], Bupropion, Chlorhexidine , Influenza vaccines, and Metoprolol    Patient is Full Code  Past Medical History:  Diagnosis Date   Anxiety    Arthritis    Asthma    Bipolar disorder (HCC)    Current every day smoker    Depression    Diabetes mellitus, type II (HCC)    Dyspnea    History of kidney stones    History of pneumonia    Hyperlipidemia    Hypertension    Morbid obesity  (HCC)    Sedentary lifestyle    Seizures (HCC)    last seizure 12 yrs ago, no current problem   Sleep apnea    uses CPAP nightly    Past Surgical History:  Procedure Laterality Date   CARDIAC CATHETERIZATION  2020   carpal tunel Left    COLONOSCOPY     HARDWARE REMOVAL Left 07/26/2022   Procedure: HARDWARE REMOVAL ELBOW;  Surgeon: [name], MD;  Location: [facility] OR;  Service: Orthopedics;  Laterality: Left;   IR FLUORO GUIDE CV LINE RIGHT  08/07/2023   IR FLUORO GUIDE CV LINE RIGHT  09/01/2023   IR GASTROSTOMY TUBE MOD SED  08/14/2023   IR REMOVAL TUN CV CATH W/O FL  08/29/2023   IR REMOVAL TUN CV CATH W/O FL  09/08/2023   IR US  GUIDE VASC ACCESS RIGHT  08/07/2023   IR US  GUIDE VASC ACCESS RIGHT  09/01/2023   ORIF HUMERUS FRACTURE Left 01/16/2022   Procedure: OPEN REDUCTION INTERNAL FIXATION (ORIF) DISTAL HUMERUS FRACTURE;  Surgeon: [name], MD;  Location: [facility] OR;  Service: Orthopedics;  Laterality: Left;   ORIF HUMERUS FRACTURE Left 01/29/2023   Procedure: OPEN REDUCTION INTERNAL FIXATION (ORIF) DISTAL HUMERUS FRACTURE;  Surgeon: [name], MD;  Location: [facility] OR;  Service: Orthopedics;  Laterality: Left;   OTHER SURGICAL HISTORY     R & L shoulder   OTHER SURGICAL HISTORY Right    Knee surgery x several   TOTAL HIP ARTHROPLASTY Left 12/26/2017   Procedure: LEFT TOTAL HIP ARTHROPLASTY ANTERIOR  APPROACH;  Surgeon: Vernetta Lonni GRADE, MD;  Location: [facility] ORS;  Service: Orthopedics;  Laterality: Left;   TOTAL KNEE ARTHROPLASTY Left 11/23/2021   Procedure: LEFT TOTAL KNEE ARTHROPLASTY;  Surgeon: Vernetta Lonni GRADE, MD;  Location: [facility] ORS;  Service: Orthopedics;  Laterality: Left;      Medications: Prior to Admission medications   Medication Sig Start Date End Date Taking? Authorizing Provider  albuterol  (PROVENTIL  HFA;VENTOLIN  HFA) 108 (90 Base) MCG/ACT inhaler Inhale 2 puffs into the lungs every 6 (six) hours as needed for wheezing or shortness of breath.   Yes  [provider]  albuterol  (PROVENTIL ) (2.5 MG/3ML) 0.083% nebulizer solution 2.5 mg every 2 (two) hours as needed for wheezing or shortness of breath.   Yes [provider]  aspirin  EC 81 MG tablet Take 81 mg by mouth daily. Swallow whole.   Yes [provider]  busPIRone  (BUSPAR ) 15 MG tablet Take 15 mg by mouth at bedtime.   Yes [provider]  celecoxib  (CELEBREX ) 200 MG capsule Take 200 mg by mouth daily.   Yes [provider]  clonazePAM  (KLONOPIN ) 1 MG tablet Take 1 mg by mouth once as needed for anxiety.   Yes [provider]  cyclobenzaprine  (FLEXERIL ) 5 MG tablet Take 1 tablet (5 mg total) by mouth 3 (three) times daily as needed for muscle spasms. Patient taking differently: Take 5 mg by mouth daily as needed for muscle spasms. 01/30/23  Yes McClung, Sarah A, PA-C  divalproex  (DEPAKOTE ) 500 MG DR tablet Take 500 mg by mouth 2 (two) times daily.   Yes [provider]  EPINEPHrine  0.3 mg/0.3 mL IJ SOAJ injection Inject 0.3 mg into the muscle as needed for anaphylaxis. 08/28/21  Yes [provider]  esomeprazole (NEXIUM) 40 MG capsule Take 40 mg by mouth in the morning.   Yes [provider]  Fluticasone -Salmeterol (ADVAIR) 500-50 MCG/DOSE AEPB Inhale 2 puffs into the lungs in the morning.   Yes [provider]  furosemide  (LASIX ) 40 MG tablet Take 40 mg by mouth daily as needed for fluid or edema.   Yes [provider]  gabapentin  (NEURONTIN ) 300 MG capsule Take 300-600 mg by mouth See admin instructions. 300 mg in the morning, 600 mg at bedtime   Yes [provider]  hydrALAZINE  (APRESOLINE ) 10 MG tablet Take 10 mg by mouth in the morning and at bedtime.   Yes [provider]  hydrochlorothiazide  (HYDRODIURIL ) 25 MG tablet Take 25 mg by mouth daily.   Yes [provider]  lisinopril  (ZESTRIL ) 40 MG tablet Take 40 mg by mouth daily. 03/25/23  Yes [provider]  metFORMIN  (GLUCOPHAGE ) 1000 MG tablet Take 1,000 mg by mouth 2 (two) times daily with a meal.   Yes [provider]  montelukast  (SINGULAIR ) 10 MG tablet Take 10 mg by mouth at bedtime.   Yes [provider]  OVER THE COUNTER MEDICATION Take 400 mg by mouth at bedtime. Burdock root   Yes [provider]  oxyCODONE  (ROXICODONE ) 5 MG immediate release tablet Take 1 tablet (5 mg total) by mouth every 4 (four) hours as needed for severe pain (pain score 7-10). 01/30/23  Yes Danton Lauraine LABOR, PA-C  PARoxetine  (PAXIL ) 40 MG tablet Take 40 mg by mouth every morning.   Yes [provider]  spironolactone  (ALDACTONE ) 25 MG tablet Take 25 mg by mouth at bedtime. 10/07/21  Yes [provider]  tamsulosin  (FLOMAX ) 0.4 MG CAPS capsule Take 0.4 mg  by mouth at bedtime. 10/07/21  Yes [provider]  testosterone  cypionate (DEPOTESTOSTERONE CYPIONATE) 200 MG/ML injection Inject 0.5 mLs (100 mg total) into the muscle every 14 (fourteen) days. 09/17/16  Yes Nida, Gebreselassie W, MD  verapamil  (CALAN -SR) 120 MG CR tablet Take 120 mg by mouth daily.   Yes [provider]  Vitamin D , Ergocalciferol , (DRISDOL ) 1.25 MG (50000 UNIT) CAPS capsule Take 1 capsule (50,000 Units total) by mouth every Thursday. Patient taking differently: Take 50,000 Units by mouth once a week. Tuesday 02/06/23  Yes Danton Lauraine LABOR, PA-C  buPROPion (WELLBUTRIN XL) 150 MG 24 hr tablet Take 150 mg by mouth daily. Patient not taking: Reported on 07/04/2023    [provider]     Family History  Problem Relation Age of Onset   Diabetes Mother    Hypertension Mother    Heart attack Mother    Osteoporosis Mother    Diabetes Father    Hypertension Father    Heart attack Father    Diabetes Sister    Hypertension Sister    Diabetes Brother    Hypertension Brother     Social History   Socioeconomic History   Marital status: Married    Spouse name: Not on file    Number of children: Not on file   Years of education: Not on file   Highest education level: Not on file  Occupational History   Not on file  Tobacco Use   Smoking status: Every Day    Current packs/day: 0.50    Average packs/day: 0.5 packs/day for 25.0 years (12.5 ttl pk-yrs)    Types: Cigarettes   Smokeless tobacco: Never  Vaping Use   Vaping status: Never Used  Substance and Sexual Activity   Alcohol  use: No   Drug use: Yes    Types: Marijuana    Comment: 01/28/2023   Sexual activity: Yes  Other Topics Concern   Not on file  Social History Narrative   Not on file   Social Drivers of Health   Financial Resource Strain: Not on file  Food Insecurity: No Food Insecurity (07/05/2023)   Hunger Vital Sign    Worried About Running Out of Food in the Last Year: Never true    Ran Out of Food in the Last Year: Never true  Transportation Needs: No Transportation Needs (07/05/2023)   PRAPARE - Administrator, Civil Service (Medical): No    Lack of Transportation (Non-Medical): No  Physical Activity: Not on file  Stress: Not on file  Social Connections: Not on file     Review of Systems: A 12 point ROS discussed and pertinent positives are indicated in the HPI above.  All other systems are negative.  Review of Systems  Vital Signs: BP 135/87 (BP Location: Right Arm)   Pulse (!) 103   Temp 98.1 F (36.7 C) (Oral)   Resp 17   Ht 6' 0.01 (1.829 m)   Wt 216 lb 7.9 oz (98.2 kg)   SpO2 97%   BMI 29.36 kg/m     Physical Exam Constitutional:      Appearance: He is obese.  Cardiovascular:     Rate and Rhythm: Normal rate and regular rhythm.     Pulses: Normal pulses.     Heart sounds: Normal heart sounds.  Pulmonary:     Effort: Pulmonary effort is normal.     Breath sounds: Normal breath sounds.  Abdominal:     Palpations: Abdomen is soft.  Tenderness: There is no abdominal tenderness.  Musculoskeletal:        General: Tenderness present.      Comments: Swelling to right hip extending to the thigh and below knee.  Paresthesia of anterior and lateral aspects of thigh and lower leg noted.  Distal sensation to right foot intact.  Distal pulses intact right pedal.  No draining wounds noted right hip.  Skin:    General: Skin is warm and dry.  Neurological:     Mental Status: He is alert and oriented to person, place, and time.  Psychiatric:        Mood and Affect: Mood normal.        Behavior: Behavior normal.        Thought Content: Thought content normal.        Judgment: Judgment normal.     Imaging: CT HIP RIGHT WO CONTRAST Result Date: 09/25/2023 CLINICAL DATA:  Right hip pain EXAM: CT OF THE RIGHT HIP WITHOUT CONTRAST TECHNIQUE: Multidetector CT imaging of the right hip was performed according to the standard protocol. Multiplanar CT image reconstructions were also generated. RADIATION DOSE REDUCTION: This exam was performed according to the departmental dose-optimization program which includes automated exposure control, adjustment of the mA and/or kV according to patient size and/or use of iterative reconstruction technique. COMPARISON:  CT right femur 08/01/2023 FINDINGS: Bones/Joint/Cartilage Moderate to prominent axial and craniocaudad chondral thinning in the right hip associated with substantial spurring of the right femoral head. Mildly aspherical femoral head morphology may predispose to cam type femoroacetabular impingement. Scattered degenerative subcortical cystic lesions along the right femoral head and acetabulum. Scattered lucencies in the marrow of the visualized bony structures including the right iliac bone, ischium, and right proximal femur. Entities such as myeloma are not excluded. Similar lesions have been reported in other bony structures example in cranial structures on the MRI head from 07/26/2023. Ligaments Suboptimally assessed by CT. Muscles and Tendons Moderate regional muscular atrophy. Soft tissues Extending  partially along the superficial fascia margin of the gluteus medius and rectus femoris muscle, there is a large fluid collection which extends above and below the margins of today's CT scan. This measures 12.9 by 6.3 cm in cross-section on image 93 of series 5 and was also present on 08/01/2023, tracking cephalad on that exam all the way up to the level of the iliac crests. Appearance favors chronic hematoma or possibly a Morel-Lavalle lesion given the lack of resolution. Right iliac artery atherosclerosis. IMPRESSION: 1. 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 Mildly aspherical femoral head morphology may predispose to cam type femoroacetabular impingement. 2. Scattered lucencies in the marrow of the visualized bony structures including the right iliac bone, ischium, and right proximal femur. Entities such as myeloma are not excluded. Similar lesions have been reported in other bony structures example in cranial structures on the MRI head from 07/26/2023. 3. Large fluid collection extending partially along the superficial fascia margin of the gluteus medius and rectus femoris muscle, also present on 08/01/2023, tracking cephalad on that exam all the way up to the level of the iliac crests. Appearance favors chronic hematoma or possibly a Morel-Lavallee lesion given the lack of resolution. 4. Moderate regional muscular atrophy. 5. Right iliac artery atherosclerosis. Electronically Signed   By: Ryan Salvage M.D.   On: 09/25/2023 08:00   CT HEAD WO CONTRAST ( ) Result Date: 09/23/2023 EXAM: CT HEAD WITHOUT CONTRAST 09/22/2023 09:56:16 PM TECHNIQUE: CT of the head was performed without the administration  of intravenous contrast. Automated exposure control, iterative reconstruction, and/or weight based adjustment of the mA/kV was utilized to reduce the radiation dose to as low as reasonably achievable. COMPARISON: None available. CLINICAL HISTORY: Transient ischemic attack (TIA). FINDINGS: BRAIN  AND VENTRICLES: No acute hemorrhage. Gray-white differentiation is preserved. No hydrocephalus. No extra-axial collection. No mass effect or midline shift. Periventricular and scattered subcortical white matter hypoattenuation is moderately advanced for age, stable. Mild generalized atrophy is stable. ORBITS: No acute abnormality. SINUSES: Bilateral mastoid effusions are again noted, right greater than left. Minimal right maxillary sinus disease is again seen. Secretions are present within the right sphenoid sinus. SOFT TISSUES AND SKULL: No acute soft tissue abnormality. No skull fracture. A lytic lesion is again noted within a right greater than left clivus. Multiple smaller lytic lesions are present throughout the calvarium, stable. VASCULATURE: Atherosclerotic calcifications are present in the cavernous carotid arteries bilaterally. No hyperdense vessel is present. IMPRESSION: 1. No acute intracranial abnormality related to the clinical history of TIA. 2. Moderately advanced periventricular and scattered subcortical white matter hypoattenuation, stable. 3. Mild generalized atrophy, stable. 4. Bilateral mastoid effusions, right greater than left, and minimal right maxillary sinus disease, again seen. Secretions are present within the right sphenoid sinus. 5. Lytic lesion within a right greater than left clivus and multiple smaller lytic lesions throughout the calvarium, stable. Electronically signed by: Lonni Necessary MD 09/23/2023 05:20 AM EDT RP Workstation: [id number]   DG Humerus Left Result Date: 09/15/2023 CLINICAL DATA:  Myeloma. EXAM: LEFT HUMERUS - 2+ VIEW COMPARISON:  09/01/2023 FINDINGS: Plate and screw fixation of distal humerus. Diffuse bony demineralization of the distal humerus with innumerable cystic lucencies, without significant interval change. Suspected pathologic fracture fixated by a hardware. No new fracture. The proximal humerus is intact. IMPRESSION: Stable radiographic appearance  of the distal humerus with plate and screw fixation. Diffusely decreased bone mineralization with innumerable cystic lucencies distally, grossly unchanged from prior. Given provided history of myeloma this is likely due to diffuse myomatous change, although infection is not excluded on imaging findings alone. Electronically Signed   By: Andrea Gasman M.D.   On: 09/15/2023 15:23   IR Removal Tun Cv Cath W/O FL Result Date: 09/09/2023 INDICATION: Dialysis catheter is no longer needed. EXAM: REMOVAL TUNNELED CENTRAL VENOUS CATHETER MEDICATIONS: None ANESTHESIA/SEDATION: None FLUOROSCOPY: None COMPLICATIONS: None immediate. PROCEDURE: Informed written consent was obtained from the patient after a thorough discussion of the procedural risks, benefits and alternatives. All questions were addressed. The patient's right chest and catheter was prepped and draped in a normal sterile fashion and manual traction was used by the IR tech to remove the catheter in its entirety. Direct pressure was held by the IR tech until hemostasis was obtained. A sterile dressing was applied. The patient tolerated the procedure well with no immediate complications. IMPRESSION: Successful catheter removal as described above. Performed by Laymon Coast, NP Electronically Signed   By: Juliene Balder M.D.   On: 09/09/2023 14:04   DG Swallowing Func-Speech Pathology Result Date: 09/08/2023 Table formatting from the original result was not included. Modified Barium Swallow Study Patient Details Name: Frank [name]. MRN: [id number] Date of Birth: Nov 11, 1965 Today's Date: 09/08/2023 HPI/PMH: HPI: HPI: 58 y.o. male transferred from [facility] 07/03/23 to [facility] for management of newly diagnosed plasma cell myeloma with weakness and AMS. Pt also with AKI and metabolic encephalopathy. 5/10 transfer to [facility] for iHD.Bedside swallow eval 4/18 wiht recs for NPO due to somnolence. 4/22 Intubated and chemo initiated.  4/23-5/10 CRRT. 5/6 IVIG started.  5/8 trach. 5/12 iHD initiated. 5/18 inability to oxygenate through trach, intubated and trach revision. PMHx:Lt THA, carpal tunnel syndrome, Lt TKA, Lt humerus fx, PAF, HTN, HLD, bipolar disorder, obesity, T2DM. Clinical Impression: Clinical Impression: Patient participated in severely limited MBS this date with max encouragement, demonstrating visible pain and discomfort throughout entire duration though agreeable to singular swallow of thin liquids. During this swallow, visibility was limited due to shoulder positioning, thus unable to fully visualize the airway below the vocal folds. Despite positioning and visibility difficulty, suspect material from single sip was merely penetrated with penetrated materials reflexively leaving the laryngeal vestibule with completion of the swallow. No cough appreciated, which in this case, given results of FEES completed 6/18, may indicate no aspiration. Patient demanded to cease swallow study, refusing any further attempts. Provided education re: inability to visualize swallowing with any solids resulting in inability to recommend any changes to current diet; recommend reinitiation of clear liquids as recommended after FEES 6/18. Will reattempt MBS in more optimal positioning for patient comfort at next available opportuniity. Factors that may increase risk of adverse event in presence of aspiration Noe & Lianne 2021): Factors that may increase risk of adverse event in presence of aspiration Noe & Lianne 2021): Reduced cognitive function; Frail or deconditioned; Dependence for feeding and/or oral hygiene; Inadequate oral hygiene; Limited mobility; Poor general health and/or compromised immunity Recommendations/Plan: Swallowing Evaluation Recommendations Swallowing Evaluation Recommendations Recommendations: PO diet PO Diet Recommendation: Clear liquid diet Liquid Administration via: Straw; Cup Medication Administration: Via alternative means Supervision: Full  supervision/cueing for swallowing strategies Swallowing strategies  : Hard cough after swallowing Postural changes: Partially reclined for meals (due to comfort) Oral care recommendations: Oral care before ice chips/water  Treatment Plan Treatment Plan Treatment recommendations: Therapy as outlined in treatment plan below Follow-up recommendations: Follow physicians's recommendations for discharge plan and follow up therapies Functional status assessment: Patient has had a recent decline in their functional status and demonstrates the ability to make significant improvements in function in a reasonable and predictable amount of time. Treatment frequency: Min 2x/week Treatment duration: 2 weeks Interventions: Aspiration precaution training; Oropharyngeal exercises; Compensatory techniques; Patient/family education; Trials of upgraded texture/liquids; Diet toleration management by SLP Recommendations Recommendations for follow up therapy are one component of a multi-disciplinary discharge planning process, led by the attending physician.  Recommendations may be updated based on patient status, additional functional criteria and insurance authorization. Assessment: Orofacial Exam: Orofacial Exam Oral Cavity - Dentition: Dentures, top Orofacial Anatomy: WFL Anatomy: Anatomy: WFL Boluses Administered: Boluses Administered Boluses Administered: Thin liquids (Level 0)  Oral Impairment Domain: Oral Impairment Domain Lip Closure: No labial escape Tongue control during bolus hold: Not tested Oral residue: Complete oral clearance Initiation of pharyngeal swallow : Pyriform sinuses  Pharyngeal Impairment Domain: Pharyngeal Impairment Domain Soft palate elevation: No bolus between soft palate (SP)/pharyngeal wall (PW) Laryngeal elevation: Complete superior movement of thyroid cartilage with complete approximation of arytenoids to epiglottic petiole Anterior hyoid excursion: Complete anterior movement Epiglottic movement: Complete  inversion Laryngeal vestibule closure: Incomplete, narrow column air/contrast in laryngeal vestibule Pharyngeal stripping wave : Present - complete Pharyngoesophageal segment opening: -- (unable to visualize due to positioning) Tongue base retraction: Trace column of contrast or air between tongue base and PPW Pharyngeal residue: Complete pharyngeal clearance  Esophageal Impairment Domain: No data recorded Pill: No data recorded Penetration/Aspiration Scale Score: Penetration/Aspiration Scale Score 2.  Material enters airway, remains ABOVE vocal cords then ejected out: Thin liquids (Level 0)  Compensatory Strategies: Compensatory Strategies Compensatory strategies: No   General Information: Caregiver present: No  Diet Prior to this Study: NPO   Temperature : Normal   Respiratory Status: WFL   Supplemental O2: None (Room air)   History of Recent Intubation: No  Behavior/Cognition: Alert; Cooperative Self-Feeding Abilities: Dependent for feeding Baseline vocal quality/speech: Normal Volitional Cough: -- (unable to test) Volitional Swallow: Able to elicit Exam Limitations: Poor positioning; Limited visibility; Poor participation; Poor bolus acceptance Goal Planning: Prognosis for improved oropharyngeal function: Good Barriers to Reach Goals: Cognitive deficits No data recorded Patient/Family Stated Goal: none stated, no family present No data recorded Pain: Pain Assessment Pain Assessment: Faces Faces Pain Scale: 10 Breathing: 0 Negative Vocalization: 0 Facial Expression: 1 Body Language: 2 Consolability: 1 PAINAD Score: 4 Pain Location: back End of Session: Start Time:SLP Start Time (ACUTE ONLY): 0855 Stop Time: SLP Stop Time (ACUTE ONLY): 0910 Time Calculation:SLP Time Calculation (min) (ACUTE ONLY): 15 min Charges: SLP Evaluations $ SLP Speech Visit: 1 Visit SLP Evaluations $MBS Swallow: 1 Procedure $Swallowing Treatment: 1 Procedure SLP visit diagnosis: SLP Visit Diagnosis: Dysphagia, oropharyngeal phase (R13.12)  Past Medical History: Past Medical History: Diagnosis Date  Anxiety   Arthritis   Asthma   Bipolar disorder (HCC)   Current every day smoker   Depression   Diabetes mellitus, type II (HCC)   Dyspnea   History of kidney stones   History of pneumonia   Hyperlipidemia   Hypertension   Morbid obesity (HCC)   Sedentary lifestyle   Seizures (HCC)   last seizure 12 yrs ago, no current problem  Sleep apnea   uses CPAP nightly Past Surgical History: Past Surgical History: Procedure Laterality Date  CARDIAC CATHETERIZATION  2020  carpal tunel Left   COLONOSCOPY    HARDWARE REMOVAL Left 07/26/2022  Procedure: HARDWARE REMOVAL ELBOW;  Surgeon: [name], MD;  Location: [facility] OR;  Service: Orthopedics;  Laterality: Left;  IR FLUORO GUIDE CV LINE RIGHT  08/07/2023  IR FLUORO GUIDE CV LINE RIGHT  09/01/2023  IR GASTROSTOMY TUBE MOD SED  08/14/2023  IR REMOVAL TUN CV CATH W/O FL  08/29/2023  IR US  GUIDE VASC ACCESS RIGHT  08/07/2023  IR US  GUIDE VASC ACCESS RIGHT  09/01/2023  ORIF HUMERUS FRACTURE Left 01/16/2022  Procedure: OPEN REDUCTION INTERNAL FIXATION (ORIF) DISTAL HUMERUS FRACTURE;  Surgeon: [name], MD;  Location: [facility] OR;  Service: Orthopedics;  Laterality: Left;  ORIF HUMERUS FRACTURE Left 01/29/2023  Procedure: OPEN REDUCTION INTERNAL FIXATION (ORIF) DISTAL HUMERUS FRACTURE;  Surgeon: [name], MD;  Location: [facility] OR;  Service: Orthopedics;  Laterality: Left;  OTHER SURGICAL HISTORY    R & L shoulder  OTHER SURGICAL HISTORY Right   Knee surgery x several  TOTAL HIP ARTHROPLASTY Left 12/26/2017  Procedure: LEFT TOTAL HIP ARTHROPLASTY ANTERIOR APPROACH;  Surgeon: Vernetta Lonni GRADE, MD;  Location: [facility];  Service: Orthopedics;  Laterality: Left;  TOTAL KNEE ARTHROPLASTY Left 11/23/2021  Procedure: LEFT TOTAL KNEE ARTHROPLASTY;  Surgeon: Vernetta Lonni GRADE, MD;  Location: [facility];  Service: Orthopedics;  Laterality: Left; Rosina DELENA Downy 09/08/2023, 1:14 PM  IR Fluoro Guide CV Line Right Result Date:  09/01/2023 CLINICAL DATA:  End-stage renal disease in need for new tunneled hemodialysis catheter. Status post removal previously placed catheter on 08/29/2023 due to bacteremia. EXAM: TUNNELED CENTRAL VENOUS HEMODIALYSIS CATHETER PLACEMENT WITH ULTRASOUND AND FLUOROSCOPIC GUIDANCE ANESTHESIA/SEDATION: Moderate (conscious) sedation was employed during this procedure. A total of Versed  1.5 mg and Fentanyl  50  mcg was administered intravenously. Moderate Sedation Time: 24 minutes. The patient's level of consciousness and vital signs were monitored continuously by radiology nursing throughout the procedure under my direct supervision. MEDICATIONS: 2 g IV Ancef . FLUOROSCOPY: Radiation Exposure Index: 2.1 mGy Kerma PROCEDURE: The procedure, risks, benefits, and alternatives were explained to the patient. Questions regarding the procedure were encouraged and answered. The patient understands and consents to the procedure. A timeout was performed prior to initiating the procedure. The right neck and chest were prepped with chlorhexidine  in a sterile fashion, and a sterile drape was applied covering the operative field. Maximum barrier sterile technique with sterile gowns and gloves were used for the procedure. Local anesthesia was provided with 1% lidocaine . Ultrasound was performed to confirm patency of the right internal jugular vein. An ultrasound image was saved and recorded. After creating a small venotomy incision, a 21 gauge needle was advanced into the right internal jugular vein under direct, real-time ultrasound guidance. After securing guidewire access, an 8 Fr dilator was placed. A J-wire was kinked to measure appropriate catheter length. A Palindrome tunneled hemodialysis catheter measuring 19 cm from tip to cuff was chosen for placement. This was tunneled in a retrograde fashion from the chest wall to the venotomy incision. At the venotomy, serial dilatation was performed and a 15 Fr peel-away sheath was  placed over a guidewire. The catheter was then placed through the sheath and the sheath removed. Final catheter positioning was confirmed and documented with a fluoroscopic spot image. The catheter was aspirated, flushed with saline, and injected with appropriate volume heparin  dwells. The venotomy incision was closed with subcutaneous and subcuticular 4-0 Vicryl. Dermabond was applied to the incision. The catheter exit site was secured with 0-Prolene retention sutures. COMPLICATIONS: None.  No pneumothorax. FINDINGS: After catheter placement, the tip lies in the right atrium. The catheter aspirates normally and is ready for immediate use. IMPRESSION: Placement of tunneled hemodialysis catheter via the right internal jugular vein. The catheter tip lies in the right atrium. The catheter is ready for immediate use. Electronically Signed   By: Marcey Moan M.D.   On: 09/01/2023 12:16   IR US  Guide Vasc Access Right Result Date: 09/01/2023 CLINICAL DATA:  End-stage renal disease in need for new tunneled hemodialysis catheter. Status post removal previously placed catheter on 08/29/2023 due to bacteremia. EXAM: TUNNELED CENTRAL VENOUS HEMODIALYSIS CATHETER PLACEMENT WITH ULTRASOUND AND FLUOROSCOPIC GUIDANCE ANESTHESIA/SEDATION: Moderate (conscious) sedation was employed during this procedure. A total of Versed  1.5 mg and Fentanyl  50 mcg was administered intravenously. Moderate Sedation Time: 24 minutes. The patient's level of consciousness and vital signs were monitored continuously by radiology nursing throughout the procedure under my direct supervision. MEDICATIONS: 2 g IV Ancef . FLUOROSCOPY: Radiation Exposure Index: 2.1 mGy Kerma PROCEDURE: The procedure, risks, benefits, and alternatives were explained to the patient. Questions regarding the procedure were encouraged and answered. The patient understands and consents to the procedure. A timeout was performed prior to initiating the procedure. The right neck  and chest were prepped with chlorhexidine  in a sterile fashion, and a sterile drape was applied covering the operative field. Maximum barrier sterile technique with sterile gowns and gloves were used for the procedure. Local anesthesia was provided with 1% lidocaine . Ultrasound was performed to confirm patency of the right internal jugular vein. An ultrasound image was saved and recorded. After creating a small venotomy incision, a 21 gauge needle was advanced into the right internal jugular vein under direct, real-time ultrasound guidance. After securing guidewire  access, an 8 Fr dilator was placed. A J-wire was kinked to measure appropriate catheter length. A Palindrome tunneled hemodialysis catheter measuring 19 cm from tip to cuff was chosen for placement. This was tunneled in a retrograde fashion from the chest wall to the venotomy incision. At the venotomy, serial dilatation was performed and a 15 Fr peel-away sheath was placed over a guidewire. The catheter was then placed through the sheath and the sheath removed. Final catheter positioning was confirmed and documented with a fluoroscopic spot image. The catheter was aspirated, flushed with saline, and injected with appropriate volume heparin  dwells. The venotomy incision was closed with subcutaneous and subcuticular 4-0 Vicryl. Dermabond was applied to the incision. The catheter exit site was secured with 0-Prolene retention sutures. COMPLICATIONS: None.  No pneumothorax. FINDINGS: After catheter placement, the tip lies in the right atrium. The catheter aspirates normally and is ready for immediate use. IMPRESSION: Placement of tunneled hemodialysis catheter via the right internal jugular vein. The catheter tip lies in the right atrium. The catheter is ready for immediate use. Electronically Signed   By: Marcey Moan M.D.   On: 09/01/2023 12:16   DG Humerus Left Result Date: 09/01/2023 CLINICAL DATA:  Fracture.  Status post ORIF. EXAM: LEFT HUMERUS -  2+ VIEW COMPARISON:  08/05/2023 FINDINGS: Similar markedly abnormal appearance to the distal humerus status post cortical plate and screw fixation. Study is limited by positioning and as before, bony anatomy is markedly demineralized with possible fragmentation of the distal humerus at the elbow in areas of substantial lucency. IMPRESSION: Limited assessment shows no substantial interval change in fixation hardware although direct comparison between the two studies is difficult due to differential positioning and markedly abnormal appearance of the distal humerus. As noted on the previous exam, given the marked loss of mineralized bone, infection is not excluded. Electronically Signed   By: Camellia Candle M.D.   On: 09/01/2023 12:05   IR Removal Tun Cv Cath W/O FL Result Date: 08/29/2023 INDICATION: Bacteremia with request for removal of tunneled catheter for line holiday. EXAM: REMOVAL OF TUNNELED HEMODIALYSIS CATHETER MEDICATIONS: None COMPLICATIONS: None immediate. PROCEDURE: Informed written consent was obtained from the patient following an explanation of the procedure, risks, benefits and alternatives to treatment. A time out was performed prior to the initiation of the procedure. Alcohol  was used to prep the patient's right neck, chest and existing catheter (patient has allergy to ChloraPrep and Betadine ). Using only gentle traction the catheter was removed intact. Hemostasis was obtained with manual compression. A dressing was placed. The patient tolerated the procedure well without immediate post procedural complication. IMPRESSION: Successful removal of tunneled dialysis catheter. Procedure performed by: Sari Lamp, PA-C Electronically Signed   By: Marcey Moan M.D.   On: 08/29/2023 16:11    Labs:  CBC: Recent Labs    09/22/23 1030 09/23/23 0821 09/24/23 0806 09/25/23 1432  WBC 6.6 4.5 5.4 5.2  HGB 8.7* 8.5* 8.8* 7.7*  HCT 25.4* 25.9* 25.9* 23.4*  PLT 266 275 254 284     COAGS: Recent Labs    07/05/23 2227 07/07/23 2030 07/10/23 0411 07/23/23 0416 07/24/23 0421 07/25/23 0409 07/26/23 0423 08/13/23 0908  INR 2.1* 1.9*  --   --   --   --   --  1.0  APTT  --  44*   < > 39* 45* 34 36  --    < > = values in this interval not displayed.    BMP: Recent Labs  09/23/23 9178 09/23/23 0822 09/24/23 0806 09/24/23 0809  NA 132* 133* 130* 130*  K 4.0 4.1 4.1 4.1  CL 102 102 98 99  CO2 21* 25 22 22   GLUCOSE 143* 141* 123* 120*  BUN 43* 45* 41* 41*  CALCIUM  8.2* 8.3* 8.2* 8.3*  CREATININE 0.94 0.94 0.98 1.01  GFRNONAA >60 >60 >60 >60    LIVER FUNCTION TESTS: Recent Labs    09/12/23 0921 09/12/23 0922 09/19/23 1003 09/20/23 0500 09/23/23 0821 09/23/23 0822 09/24/23 0806 09/24/23 0809  BILITOT 0.3  --  0.5  --  0.4  --  0.4  --   AST 19  --  15  --  11*  --  12*  --   ALT 12  --  10  --  8  --  7  --   ALKPHOS 113  --  106  --  93  --  84  --   PROT 4.9*  --  5.2*  --  5.2*  --  5.3*  --   ALBUMIN  2.0*   < > 2.1*  2.1*   < > 2.0* 2.0* 2.1* 2.1*   < > = values in this interval not displayed.    TUMOR MARKERS: No results for input(s): AFPTM, CEA, CA199, CHROMGRNA in the last 8760 hours.  Assessment and Plan:  Request for  image guided medius/rectus femoris fluid collection aspiration and possible drain placement approved by Dr. Jenna for 7/11.  He plans to fluid collection aspirate; he will place a drain if fluid collection appears to be abscess material but will unlikely place drain if it appears to be a hematoma  (typically do not drain well with pigtail catheter as as there is often a gelatinous component). No contraindications for procedure identified in ROS, physical exam, or review of pre-sedation considerations.  Labs reviewed and within acceptable range 09/24/2023 right CT hip imaging available and reviewed. VSS, afebrile Patient not asked to hold any AC/AP for this low bleeding risk procedure He will be n.p.o. at  midnight    Risks and benefits discussed with the patient including bleeding, infection, damage to adjacent structures, and sepsis.  All of the patient's questions were answered, patient is agreeable to proceed. Consent signed and in chart.   Thank you for allowing our service to participate in Jiovani Skiler Olden. 's care.    Electronically Signed: Laymon Coast, NP   09/25/2023, 4:49 PM     I spent a total of 20 Minutes    in face to face in clinical consultation, greater than 50% of which was counseling/coordinating care for image guided fluid collection aspiration and possible drain placement to gluteus medius/rectus femoris.   (A copy of this note was sent to the referring provider and the time of visit.)

## 2023-09-26 ENCOUNTER — Inpatient Hospital Stay (HOSPITAL_COMMUNITY)

## 2023-09-26 ENCOUNTER — Other Ambulatory Visit: Payer: Self-pay

## 2023-09-26 DIAGNOSIS — A419 Sepsis, unspecified organism: Secondary | ICD-10-CM | POA: Diagnosis not present

## 2023-09-26 DIAGNOSIS — R6521 Severe sepsis with septic shock: Secondary | ICD-10-CM | POA: Diagnosis not present

## 2023-09-26 LAB — COMPREHENSIVE METABOLIC PANEL WITH GFR
ALT: 7 U/L (ref 0–44)
AST: 11 U/L — ABNORMAL LOW (ref 15–41)
Albumin: 1.9 g/dL — ABNORMAL LOW (ref 3.5–5.0)
Alkaline Phosphatase: 76 U/L (ref 38–126)
Anion gap: 10 (ref 5–15)
BUN: 43 mg/dL — ABNORMAL HIGH (ref 6–20)
CO2: 22 mmol/L (ref 22–32)
Calcium: 8.1 mg/dL — ABNORMAL LOW (ref 8.9–10.3)
Chloride: 97 mmol/L — ABNORMAL LOW (ref 98–111)
Creatinine, Ser: 0.89 mg/dL (ref 0.61–1.24)
GFR, Estimated: >60 mL/min (ref >60)
Glucose, Bld: 88 mg/dL (ref 70–99)
Potassium: 4.3 mmol/L (ref 3.5–5.1)
Sodium: 129 mmol/L — ABNORMAL LOW (ref 135–145)
Total Bilirubin: 0.4 mg/dL (ref 0.0–1.2)
Total Protein: 5.3 g/dL — ABNORMAL LOW (ref 6.5–8.1)

## 2023-09-26 LAB — GLUCOSE, CAPILLARY
Glucose-Capillary: 86 mg/dL (ref 70–99)
Glucose-Capillary: 133 mg/dL — ABNORMAL HIGH (ref 70–99)
Glucose-Capillary: 105 mg/dL — ABNORMAL HIGH (ref 70–99)
Glucose-Capillary: 110 mg/dL — ABNORMAL HIGH (ref 70–99)

## 2023-09-26 LAB — CBC WITH DIFFERENTIAL/PLATELET
Abs Immature Granulocytes: 0.02 K/uL (ref 0.00–0.07)
Basophils Absolute: 0 K/uL (ref 0.0–0.1)
Basophils Relative: 1 %
Eosinophils Absolute: 0 K/uL (ref 0.0–0.5)
Eosinophils Relative: 1 %
HCT: 24 % — ABNORMAL LOW (ref 39.0–52.0)
Hemoglobin: 8 g/dL — ABNORMAL LOW (ref 13.0–17.0)
Immature Granulocytes: 0 %
Lymphocytes Relative: 29 %
Lymphs Abs: 1.4 K/uL (ref 0.7–4.0)
MCH: 35.7 pg — ABNORMAL HIGH (ref 26.0–34.0)
MCHC: 33.3 g/dL (ref 30.0–36.0)
MCV: 107.1 fL — ABNORMAL HIGH (ref 80.0–100.0)
Monocytes Absolute: 1.1 K/uL — ABNORMAL HIGH (ref 0.1–1.0)
Monocytes Relative: 23 %
Neutro Abs: 2.2 K/uL (ref 1.7–7.7)
Neutrophils Relative %: 46 %
Platelets: 261 K/uL (ref 150–400)
RBC: 2.24 MIL/uL — ABNORMAL LOW (ref 4.22–5.81)
RDW: 19.9 % — ABNORMAL HIGH (ref 11.5–15.5)
WBC: 4.7 K/uL (ref 4.0–10.5)
nRBC: 0 % (ref 0.0–0.2)

## 2023-09-26 MED ORDER — SODIUM CHLORIDE 0.9 % IV SOLN
2.0000 g | INTRAVENOUS | Status: DC
  Administered 2023-09-26 – 2023-09-29 (×4): 2 g via INTRAVENOUS
  Filled 2023-09-26 (×4): qty 20

## 2023-09-26 MED ORDER — ALUM & MAG HYDROXIDE-SIMETH 200-200-20 MG/5ML PO SUSP
30.0000 mL | ORAL | Status: DC | PRN
  Administered 2023-09-26: 30 mL via ORAL
  Filled 2023-09-26: qty 30

## 2023-09-26 MED ORDER — MIDAZOLAM HCL 2 MG/2ML IJ SOLN
INTRAMUSCULAR | Status: AC
  Filled 2023-09-26: qty 2

## 2023-09-26 MED ORDER — METRONIDAZOLE 500 MG/100ML IV SOLN
500.0000 mg | Freq: Two times a day (BID) | INTRAVENOUS | Status: DC
  Administered 2023-09-26 – 2023-09-29 (×6): 500 mg via INTRAVENOUS
  Filled 2023-09-26 (×6): qty 100

## 2023-09-26 MED ORDER — LIDOCAINE HCL (PF) 1 % IJ SOLN
10.0000 mL | Freq: Once | INTRAMUSCULAR | Status: AC
  Administered 2023-09-26: 10 mL via INTRADERMAL

## 2023-09-26 MED ORDER — DAKINS (1/4 STRENGTH) 0.125 % EX SOLN
Freq: Every day | CUTANEOUS | Status: AC
  Filled 2023-09-26 (×2): qty 473

## 2023-09-26 MED ORDER — FENTANYL CITRATE (PF) 100 MCG/2ML IJ SOLN
INTRAMUSCULAR | Status: AC
  Filled 2023-09-26: qty 2

## 2023-09-26 MED ORDER — FENTANYL CITRATE (PF) 100 MCG/2ML IJ SOLN
INTRAMUSCULAR | Status: AC | PRN
  Administered 2023-09-26: 50 ug via INTRAVENOUS

## 2023-09-26 MED ORDER — VANCOMYCIN HCL 1500 MG/300ML IV SOLN
1500.0000 mg | Freq: Two times a day (BID) | INTRAVENOUS | Status: AC
  Administered 2023-09-27 – 2023-09-30 (×8): 1500 mg via INTRAVENOUS
  Filled 2023-09-26 (×9): qty 300

## 2023-09-26 MED ORDER — MIDAZOLAM HCL 2 MG/2ML IJ SOLN
INTRAMUSCULAR | Status: AC | PRN
  Administered 2023-09-26: 1 mg via INTRAVENOUS

## 2023-09-26 MED ORDER — VANCOMYCIN HCL 2000 MG/400ML IV SOLN
2000.0000 mg | Freq: Once | INTRAVENOUS | Status: AC
  Administered 2023-09-26: 2000 mg via INTRAVENOUS
  Filled 2023-09-26: qty 400

## 2023-09-26 NOTE — Progress Notes (Signed)
 Physical Therapy Wound Treatment Patient Details  Name: Frank [name]. MRN: [id number] Date of Birth: Jan 19, 1966  Today's Date: 09/26/2023 Time: 8664-8581 Time Calculation (min): 43 min  Subjective  Subjective Assessment Patient and Family Stated Goals: to be able to see his grandson Date of Onset:  (exact date unknown) Prior Treatments: Sacral foam  Pain Score:  5/10   Wound Assessment  Wound Image   09/17/23 1300  Dressing Type Gauze (Comment);Santyl ;ABD;Barrier Film (skin prep) 09/26/23 1500  Dressing Changed 09/26/23 1500  Dressing Change Frequency Daily 09/26/23 1500  State of Healing Early/partial granulation 09/26/23 1500  Site / Wound Assessment Clean;Yellow;Pink;Pale;Granulation tissue 09/26/23 1500  % Wound base Red or Granulating 25% 09/26/23 1500  % Wound base Yellow/Fibrinous Exudate 75% 09/26/23 1500  % Wound base Black/Eschar 0% 09/17/23 1300  % Wound base Other/Granulation Tissue (Comment) 0% 09/17/23 1300  Peri-wound Assessment Erythema (blanchable);Maceration 09/26/23 1500  Wound Length (cm) 5.2 cm 09/23/23 1500  Wound Width (cm) 4.3 cm 09/23/23 1500  Wound Depth (cm) 0.5 cm 09/23/23 1500  Wound Surface Area (cm^2) 22.36 cm^2 09/23/23 1500  Wound Volume (cm^3) 11.18 cm^3 09/23/23 1500  Margins Unattached edges (unapproximated) 09/26/23 1500  Drainage Amount Moderate 09/26/23 1500  Drainage Description Green;Serous 09/26/23 1500  Treatment Cleansed;Debridement (Selective);Irrigation 09/26/23 1500           Wound Assessment and Plan  Wound Therapy - Assess/Plan/Recommendations Wound Therapy - Clinical Statement: pt with increased drowsiness after surgical procedure but agreeable to wound therapy. Pt wound drainage has turned blue-green. WOC RN advised and has changed treatment to Dakin's soaked gauze in order to adress likely Pseudomonas involvement. Pt will conitnue to benefit from selective debridement to reduce bioburden and promote wound  healing. Wound Therapy - Functional Problem List: decreased mobility, cancer and chemo Factors Delaying/Impairing Wound Healing: Diabetes Mellitus, Multiple medical problems, Immobility, Polypharmacy Hydrotherapy Plan: Debridement, Dressing change, Patient/family education Wound Therapy - Frequency: 2X / week Wound Therapy - Current Recommendations: OT, PT, Case manager/social work Wound Therapy - Follow Up Recommendations: dressing changes by RN  Wound Therapy Goals- Improve the function of patient's integumentary system by progressing the wound(s) through the phases of wound healing (inflammation - proliferation - remodeling) by: Wound Therapy Goals - Improve the function of patient's integumentary system by progressing the wound(s) through the phases of wound healing by: Decrease Necrotic Tissue to: 50 Decrease Necrotic Tissue - Progress: Progressing toward goal Increase Granulation Tissue to: 30 Increase Granulation Tissue - Progress: Progressing toward goal Decrease Length/Width/Depth by (cm): L and W by 0.5 cm Decrease Length/Width/Depth - Progress: Not progressing Goals/treatment plan/discharge plan were made with and agreed upon by patient/family: Yes Time For Goal Achievement: 7 days Wound Therapy - Potential for Goals: Fair  Goals will be updated until maximal potential achieved or discharge criteria met.  Discharge criteria: when goals achieved, discharge from hospital, MD decision/surgical intervention, no progress towards goals, refusal/missing three consecutive treatments without notification or medical reason.  GP     Charges PT Wound Care Charges $Wound Debridement up to 20 cm: < or equal to 20 cm $PT Hydrotherapy Dressing: 2 dressings $PT Hydrotherapy Visit: 1 Visit     Korina Tretter B. Fleeta Lapidus PT, DPT Acute Rehabilitation Services Please use secure chat or  Call Office [phone]   [name] [facility] 09/26/2023, 3:23 PM

## 2023-09-26 NOTE — Progress Notes (Signed)
 PHARMACY ANTIBIOTIC CONSULT NOTE   Frank [name]. a 58 y.o. male with infected gluteal hematoma s/p drain placement by IR .  Pharmacy has been consulted for Vancomycin  dosing.  7/11: Scr 0.89, WBC 4.7   Estimated Creatinine Clearance: 109.8 mL/min (by C-G formula based on SCr of 0.89 mg/dL).  Plan: GIVE Vancomycin  2,000 mg IV x1  THEN Vancomycin  1500 mg IV Q12H (Scr used: 0.89, Vd used: 0.72, eAUC: 481) START ceftriaxone  2g IV Q24H  MTZ 500 IV BID per MD   Monitor renal function, clinical status, de-escalation, C/S, levels as indicated   Allergies:  Allergies  Allergen Reactions   Morphine  And Codeine Anaphylaxis   Shellfish Allergy Anaphylaxis   Betadine  [Povidone Iodine ] Itching   Bupropion Other (See Comments)    Shaking of the body, hallucinations    Chlorhexidine  Hives    Patient reports never had issues CHG with mouth rinse.   Influenza Vaccines Hives   Metoprolol  Rash    Filed Weights   09/24/23 0500 09/25/23 0500 09/26/23 0500  Weight: 97.4 kg (214 lb 11.7 oz) 98.2 kg (216 lb 7.9 oz) 100.9 kg (222 lb 7.1 oz)       Latest Ref Rng & Units 09/26/2023    4:47 AM 09/25/2023    2:32 PM 09/24/2023    8:06 AM  CBC  WBC 4.0 - 10.5 K/uL 4.7  5.2  5.4   Hemoglobin 13.0 - 17.0 g/dL 8.0  7.7  8.8   Hematocrit 39.0 - 52.0 % 24.0  23.4  25.9   Platelets 150 - 400 K/uL 261  284  254     Antibiotics Given (last 72 hours)     Date/Time Action Medication Dose Rate   09/23/23 2152 Given   acyclovir  (ZOVIRAX ) 200 MG capsule 400 mg 400 mg    09/24/23 0855 Given   acyclovir  (ZOVIRAX ) 200 MG capsule 400 mg 400 mg    09/24/23 2150 Given   acyclovir  (ZOVIRAX ) 200 MG capsule 400 mg 400 mg    09/25/23 1101 Given   acyclovir  (ZOVIRAX ) 200 MG capsule 400 mg 400 mg    09/25/23 2216 Given   acyclovir  (ZOVIRAX ) 200 MG capsule 400 mg 400 mg    09/26/23 1029 Given   acyclovir  (ZOVIRAX ) 200 MG capsule 400 mg 400 mg    09/26/23 1331 New Bag/Given   metroNIDAZOLE  (FLAGYL ) IVPB 500 mg  500 mg 100 mL/hr       4/17 R/Z >> 4/22 Zosyn  >> 5/1 Meropenem  >> 5/6 Levaquin  >> 5/12 4/28 Vanco >> 5/5 4/18 Fluconazole  >> 4/23 5/4 Voriconazole  >> 5/6>> reinitiated 5/10 >>(8/5) 5/10 Unasyn  >> 5/11  5/11 vancomycin  >> 5/16 Acyclovir  ppx 5/24 >> Vancomycin  6/8 >>6/19: 7/11>>   4/19: MRSA PCR: neg 4/19 UCx: ngF 4/19 BCx: ngF 4/23 TA: few Staph epi: MRSE 4/28 BCx: ngF 4/30 TA: rare aspergillus fumigatus 5/2 CSF: neg 5/4 TA: rare mold, aspergillus [place]  5/5 TA PJP stain: neg 5/5 MRSA PCR: neg 5/5 BCx: NgF 5/9 tracheal asp: abundant Staph haemolyticus + mold, Aspergillus [place]  5/11 BCx: neg 5/15 TA: NF 6/7 UCx: ng 6/7 BCx: MRSE (1 of 3 bottles) 6/9 blood: neg 7/11 abscess cx: sent   Thank you for allowing pharmacy to be a part of this patient's care.  Massie Fila, PharmD Clinical Pharmacist  09/26/2023 1:49 PM

## 2023-09-26 NOTE — Progress Notes (Addendum)
 Frank [name].   DOB:April 10, 1965   FM#:[id number]    BRIEF ONCOLOGY NOTE: N/C  ASSESSMENT & PLAN:  Labs reviewed today, CBC stable: WBC 4.7. HGB 8.0 Platelets 261K. No heme intervention required at this time.  Multiple Myeloma: Next Cycle 2 chemotherapy scheduled for 10/02/23. Newly diagnosed infection of hematoma, could possibly delay treatment.  [name] will make further recommendations next week.  R thigh Hematoma: Status post IR drain placement of right thigh hematoma done today, suspicious for infection per IR.  Started on IV vancomycin  today. Medicine managing.   Continue all supportive care.  Following closely.      Frank PARAS Alizee Maple, NP 09/26/2023 8:58 AM    Labs Reviewed:  Lab Results  Component Value Date   WBC 4.7 09/26/2023   HGB 8.0 (L) 09/26/2023   HCT 24.0 (L) 09/26/2023   MCV 107.1 (H) 09/26/2023   PLT 261 09/26/2023   Recent Labs    07/23/23 0416 07/23/23 1722 08/28/23 0515 08/29/23 0752 09/24/23 0806 09/24/23 0809 09/25/23 1432 09/26/23 0447  NA 128*   < > 131*   < > 130* 130* 128* 129*  K 4.2   < > 3.5   < > 4.1 4.1 4.6 4.3  CL 99   < > 94*   < > 98 99 98 97*  CO2 24   < > 26   < > 22 22 19* 22  GLUCOSE 128*   < > 86   < > 123* 120* 127* 88  BUN 33*   < > 60*   < > 41* 41* 42* 43*  CREATININE 1.62*   < > 1.67*   < > 0.98 1.01 0.93 0.89  CALCIUM  7.5*   < > 8.5*   < > 8.2* 8.3* 7.8* 8.1*  GFRNONAA 49*   < > 47*   < > >60 >60 >60 >60  PROT 8.5*   < > 6.5   < > 5.3*  --  5.2* 5.3*  ALBUMIN  <1.5*  <1.5*   < > 2.4*  2.4*   < > 2.1* 2.1* 1.9* 1.9*  AST 19   < > 21   < > 12*  --  9* 11*  ALT 14   < > 16   < > 7  --  7 7  ALKPHOS 59   < > 55   < > 84  --  79 76  BILITOT 0.5   < > 0.6   < > 0.4  --  0.4 0.4  BILIDIR <0.1  --  0.1  --   --   --   --   --   IBILI NOT CALCULATED  --  0.5  --   --   --   --   --    < > = values in this interval not displayed.    Studies Reviewed:  CT HEAD WO CONTRAST ( ) Result Date: 09/25/2023 CLINICAL DATA:   Initial evaluation for acute neuro deficit, stroke suspected. EXAM: CT HEAD WITHOUT CONTRAST TECHNIQUE: Contiguous axial images were obtained from the base of the skull through the vertex without intravenous contrast. RADIATION DOSE REDUCTION: This exam was performed according to the departmental dose-optimization program which includes automated exposure control, adjustment of the mA and/or kV according to patient size and/or use of iterative reconstruction technique. COMPARISON:  CT from 09/22/2023. FINDINGS: Brain: Cerebral volume within normal limits. Patchy hypodensity involving the supratentorial cerebral white matter, most characteristic of chronic microvascular ischemic disease,  moderate in nature. No acute intracranial hemorrhage. No acute large vessel territory infarct. No mass lesion, midline shift or mass effect. No hydrocephalus or extra-axial fluid collection. Vascular: No abnormal hyperdense vessel. Calcified atherosclerosis present at the skull base. Skull: Scalp soft tissues demonstrate no acute finding. Multiple lucent lesions again noted throughout the calvarium and visualized skull base, consistent with history of multiple myeloma. Sinuses/Orbits: Globes orbital soft tissues within normal limits. Mild mucosal thickening noted about the right maxillary sinus. Minimal pneumatized secretions noted within the right sphenoid sinus. Large right mastoid and middle ear effusion, with small left mastoid effusion. Imaged nasopharynx grossly unremarkable. Other: None. IMPRESSION: 1. No acute intracranial abnormality. 2. Moderate chronic microvascular ischemic disease. 3. Multiple lucent lesions throughout the calvarium and visualized skull base, consistent with history of multiple myeloma. 4. Large right mastoid and middle ear effusion, with small left mastoid effusion. Findings of uncertain significance, however, correlation with physical exam for possible acute otomastoiditis recommended. Electronically  Signed   By: Morene Hoard M.D.   On: 09/25/2023 20:46   CT HIP RIGHT WO CONTRAST Result Date: 09/25/2023 CLINICAL DATA:  Right hip pain EXAM: CT OF THE RIGHT HIP WITHOUT CONTRAST TECHNIQUE: Multidetector CT imaging of the right hip was performed according to the standard protocol. Multiplanar CT image reconstructions were also generated. RADIATION DOSE REDUCTION: This exam was performed according to the departmental dose-optimization program which includes automated exposure control, adjustment of the mA and/or kV according to patient size and/or use of iterative reconstruction technique. COMPARISON:  CT right femur 08/01/2023 FINDINGS: Bones/Joint/Cartilage Moderate to prominent axial and craniocaudad chondral thinning in the right hip associated with substantial spurring of the right femoral head. Mildly aspherical femoral head morphology may predispose to cam type femoroacetabular impingement. Scattered degenerative subcortical cystic lesions along the right femoral head and acetabulum. Scattered lucencies in the marrow of the visualized bony structures including the right iliac bone, ischium, and right proximal femur. Entities such as myeloma are not excluded. Similar lesions have been reported in other bony structures example in cranial structures on the MRI head from 07/26/2023. Ligaments Suboptimally assessed by CT. Muscles and Tendons Moderate regional muscular atrophy. Soft tissues Extending partially along the superficial fascia margin of the gluteus medius and rectus femoris muscle, there is a large fluid collection which extends above and below the margins of today's CT scan. This measures 12.9 by 6.3 cm in cross-section on image 93 of series 5 and was also present on 08/01/2023, tracking cephalad on that exam all the way up to the level of the iliac crests. Appearance favors chronic hematoma or possibly a Morel-Lavalle lesion given the lack of resolution. Right iliac artery atherosclerosis.  IMPRESSION: 1. 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 Mildly aspherical femoral head morphology may predispose to cam type femoroacetabular impingement. 2. Scattered lucencies in the marrow of the visualized bony structures including the right iliac bone, ischium, and right proximal femur. Entities such as myeloma are not excluded. Similar lesions have been reported in other bony structures example in cranial structures on the MRI head from 07/26/2023. 3. Large fluid collection extending partially along the superficial fascia margin of the gluteus medius and rectus femoris muscle, also present on 08/01/2023, tracking cephalad on that exam all the way up to the level of the iliac crests. Appearance favors chronic hematoma or possibly a Morel-Lavallee lesion given the lack of resolution. 4. Moderate regional muscular atrophy. 5. Right iliac artery atherosclerosis. Electronically Signed   By: Ryan Salvage  M.D.   On: 09/25/2023 08:00   CT HEAD WO CONTRAST ( ) Result Date: 09/23/2023 EXAM: CT HEAD WITHOUT CONTRAST 09/22/2023 09:56:16 PM TECHNIQUE: CT of the head was performed without the administration of intravenous contrast. Automated exposure control, iterative reconstruction, and/or weight based adjustment of the mA/kV was utilized to reduce the radiation dose to as low as reasonably achievable. COMPARISON: None available. CLINICAL HISTORY: Transient ischemic attack (TIA). FINDINGS: BRAIN AND VENTRICLES: No acute hemorrhage. Gray-white differentiation is preserved. No hydrocephalus. No extra-axial collection. No mass effect or midline shift. Periventricular and scattered subcortical white matter hypoattenuation is moderately advanced for age, stable. Mild generalized atrophy is stable. ORBITS: No acute abnormality. SINUSES: Bilateral mastoid effusions are again noted, right greater than left. Minimal right maxillary sinus disease is again seen. Secretions are present within the right  sphenoid sinus. SOFT TISSUES AND SKULL: No acute soft tissue abnormality. No skull fracture. A lytic lesion is again noted within a right greater than left clivus. Multiple smaller lytic lesions are present throughout the calvarium, stable. VASCULATURE: Atherosclerotic calcifications are present in the cavernous carotid arteries bilaterally. No hyperdense vessel is present. IMPRESSION: 1. No acute intracranial abnormality related to the clinical history of TIA. 2. Moderately advanced periventricular and scattered subcortical white matter hypoattenuation, stable. 3. Mild generalized atrophy, stable. 4. Bilateral mastoid effusions, right greater than left, and minimal right maxillary sinus disease, again seen. Secretions are present within the right sphenoid sinus. 5. Lytic lesion within a right greater than left clivus and multiple smaller lytic lesions throughout the calvarium, stable. Electronically signed by: Lonni Necessary MD 09/23/2023 05:20 AM EDT RP Workstation: [id number]   DG Humerus Left Result Date: 09/15/2023 CLINICAL DATA:  Myeloma. EXAM: LEFT HUMERUS - 2+ VIEW COMPARISON:  09/01/2023 FINDINGS: Plate and screw fixation of distal humerus. Diffuse bony demineralization of the distal humerus with innumerable cystic lucencies, without significant interval change. Suspected pathologic fracture fixated by a hardware. No new fracture. The proximal humerus is intact. IMPRESSION: Stable radiographic appearance of the distal humerus with plate and screw fixation. Diffusely decreased bone mineralization with innumerable cystic lucencies distally, grossly unchanged from prior. Given provided history of myeloma this is likely due to diffuse myomatous change, although infection is not excluded on imaging findings alone. Electronically Signed   By: Andrea Gasman M.D.   On: 09/15/2023 15:23   IR Removal Tun Cv Cath W/O FL Result Date: 09/09/2023 INDICATION: Dialysis catheter is no longer needed. EXAM:  REMOVAL TUNNELED CENTRAL VENOUS CATHETER MEDICATIONS: None ANESTHESIA/SEDATION: None FLUOROSCOPY: None COMPLICATIONS: None immediate. PROCEDURE: Informed written consent was obtained from the patient after a thorough discussion of the procedural risks, benefits and alternatives. All questions were addressed. The patient's right chest and catheter was prepped and draped in a normal sterile fashion and manual traction was used by the IR tech to remove the catheter in its entirety. Direct pressure was held by the IR tech until hemostasis was obtained. A sterile dressing was applied. The patient tolerated the procedure well with no immediate complications. IMPRESSION: Successful catheter removal as described above. Performed by Laymon Coast, NP Electronically Signed   By: Juliene Balder M.D.   On: 09/09/2023 14:04   DG Swallowing Func-Speech Pathology Result Date: 09/08/2023 Table formatting from the original result was not included. Modified Barium Swallow Study Patient Details Name: Frank [name]. MRN: [id number] Date of Birth: 1965-12-23 Today's Date: 09/08/2023 HPI/PMH: HPI: HPI: 58 y.o. male transferred from [facility] 07/03/23 to [facility] for management of newly diagnosed plasma  cell myeloma with weakness and AMS. Pt also with AKI and metabolic encephalopathy. 5/10 transfer to [facility] for iHD.Bedside swallow eval 4/18 wiht recs for NPO due to somnolence. 4/22 Intubated and chemo initiated. 4/23-5/10 CRRT. 5/6 IVIG started. 5/8 trach. 5/12 iHD initiated. 5/18 inability to oxygenate through trach, intubated and trach revision. PMHx:Lt THA, carpal tunnel syndrome, Lt TKA, Lt humerus fx, PAF, HTN, HLD, bipolar disorder, obesity, T2DM. Clinical Impression: Clinical Impression: Patient participated in severely limited MBS this date with max encouragement, demonstrating visible pain and discomfort throughout entire duration though agreeable to singular swallow of thin liquids. During this swallow, visibility was limited  due to shoulder positioning, thus unable to fully visualize the airway below the vocal folds. Despite positioning and visibility difficulty, suspect material from single sip was merely penetrated with penetrated materials reflexively leaving the laryngeal vestibule with completion of the swallow. No cough appreciated, which in this case, given results of FEES completed 6/18, may indicate no aspiration. Patient demanded to cease swallow study, refusing any further attempts. Provided education re: inability to visualize swallowing with any solids resulting in inability to recommend any changes to current diet; recommend reinitiation of clear liquids as recommended after FEES 6/18. Will reattempt MBS in more optimal positioning for patient comfort at next available opportuniity. Factors that may increase risk of adverse event in presence of aspiration Noe & Lianne 2021): Factors that may increase risk of adverse event in presence of aspiration Noe & Lianne 2021): Reduced cognitive function; Frail or deconditioned; Dependence for feeding and/or oral hygiene; Inadequate oral hygiene; Limited mobility; Poor general health and/or compromised immunity Recommendations/Plan: Swallowing Evaluation Recommendations Swallowing Evaluation Recommendations Recommendations: PO diet PO Diet Recommendation: Clear liquid diet Liquid Administration via: Straw; Cup Medication Administration: Via alternative means Supervision: Full supervision/cueing for swallowing strategies Swallowing strategies  : Hard cough after swallowing Postural changes: Partially reclined for meals (due to comfort) Oral care recommendations: Oral care before ice chips/water  Treatment Plan Treatment Plan Treatment recommendations: Therapy as outlined in treatment plan below Follow-up recommendations: Follow physicians's recommendations for discharge plan and follow up therapies Functional status assessment: Patient has had a recent decline in their  functional status and demonstrates the ability to make significant improvements in function in a reasonable and predictable amount of time. Treatment frequency: Min 2x/week Treatment duration: 2 weeks Interventions: Aspiration precaution training; Oropharyngeal exercises; Compensatory techniques; Patient/family education; Trials of upgraded texture/liquids; Diet toleration management by SLP Recommendations Recommendations for follow up therapy are one component of a multi-disciplinary discharge planning process, led by the attending physician.  Recommendations may be updated based on patient status, additional functional criteria and insurance authorization. Assessment: Orofacial Exam: Orofacial Exam Oral Cavity - Dentition: Dentures, top Orofacial Anatomy: WFL Anatomy: Anatomy: WFL Boluses Administered: Boluses Administered Boluses Administered: Thin liquids (Level 0)  Oral Impairment Domain: Oral Impairment Domain Lip Closure: No labial escape Tongue control during bolus hold: Not tested Oral residue: Complete oral clearance Initiation of pharyngeal swallow : Pyriform sinuses  Pharyngeal Impairment Domain: Pharyngeal Impairment Domain Soft palate elevation: No bolus between soft palate (SP)/pharyngeal wall (PW) Laryngeal elevation: Complete superior movement of thyroid cartilage with complete approximation of arytenoids to epiglottic petiole Anterior hyoid excursion: Complete anterior movement Epiglottic movement: Complete inversion Laryngeal vestibule closure: Incomplete, narrow column air/contrast in laryngeal vestibule Pharyngeal stripping wave : Present - complete Pharyngoesophageal segment opening: -- (unable to visualize due to positioning) Tongue base retraction: Trace column of contrast or air between tongue base and PPW Pharyngeal residue: Complete pharyngeal clearance  Esophageal Impairment Domain: No data recorded Pill: No data recorded Penetration/Aspiration Scale Score: Penetration/Aspiration Scale  Score 2.  Material enters airway, remains ABOVE vocal cords then ejected out: Thin liquids (Level 0) Compensatory Strategies: Compensatory Strategies Compensatory strategies: No   General Information: Caregiver present: No  Diet Prior to this Study: NPO   Temperature : Normal   Respiratory Status: WFL   Supplemental O2: None (Room air)   History of Recent Intubation: No  Behavior/Cognition: Alert; Cooperative Self-Feeding Abilities: Dependent for feeding Baseline vocal quality/speech: Normal Volitional Cough: -- (unable to test) Volitional Swallow: Able to elicit Exam Limitations: Poor positioning; Limited visibility; Poor participation; Poor bolus acceptance Goal Planning: Prognosis for improved oropharyngeal function: Good Barriers to Reach Goals: Cognitive deficits No data recorded Patient/Family Stated Goal: none stated, no family present No data recorded Pain: Pain Assessment Pain Assessment: Faces Faces Pain Scale: 10 Breathing: 0 Negative Vocalization: 0 Facial Expression: 1 Body Language: 2 Consolability: 1 PAINAD Score: 4 Pain Location: back End of Session: Start Time:SLP Start Time (ACUTE ONLY): 0855 Stop Time: SLP Stop Time (ACUTE ONLY): 0910 Time Calculation:SLP Time Calculation (min) (ACUTE ONLY): 15 min Charges: SLP Evaluations $ SLP Speech Visit: 1 Visit SLP Evaluations $MBS Swallow: 1 Procedure $Swallowing Treatment: 1 Procedure SLP visit diagnosis: SLP Visit Diagnosis: Dysphagia, oropharyngeal phase (R13.12) Past Medical History: Past Medical History: Diagnosis Date  Anxiety   Arthritis   Asthma   Bipolar disorder (HCC)   Current every day smoker   Depression   Diabetes mellitus, type II (HCC)   Dyspnea   History of kidney stones   History of pneumonia   Hyperlipidemia   Hypertension   Morbid obesity (HCC)   Sedentary lifestyle   Seizures (HCC)   last seizure 12 yrs ago, no current problem  Sleep apnea   uses CPAP nightly Past Surgical History: Past Surgical History: Procedure Laterality Date   CARDIAC CATHETERIZATION  2020  carpal tunel Left   COLONOSCOPY    HARDWARE REMOVAL Left 07/26/2022  Procedure: HARDWARE REMOVAL ELBOW;  Surgeon: [name], MD;  Location: [facility] OR;  Service: Orthopedics;  Laterality: Left;  IR FLUORO GUIDE CV LINE RIGHT  08/07/2023  IR FLUORO GUIDE CV LINE RIGHT  09/01/2023  IR GASTROSTOMY TUBE MOD SED  08/14/2023  IR REMOVAL TUN CV CATH W/O FL  08/29/2023  IR US  GUIDE VASC ACCESS RIGHT  08/07/2023  IR US  GUIDE VASC ACCESS RIGHT  09/01/2023  ORIF HUMERUS FRACTURE Left 01/16/2022  Procedure: OPEN REDUCTION INTERNAL FIXATION (ORIF) DISTAL HUMERUS FRACTURE;  Surgeon: [name], MD;  Location: [facility] OR;  Service: Orthopedics;  Laterality: Left;  ORIF HUMERUS FRACTURE Left 01/29/2023  Procedure: OPEN REDUCTION INTERNAL FIXATION (ORIF) DISTAL HUMERUS FRACTURE;  Surgeon: [name], MD;  Location: [facility] OR;  Service: Orthopedics;  Laterality: Left;  OTHER SURGICAL HISTORY    R & L shoulder  OTHER SURGICAL HISTORY Right   Knee surgery x several  TOTAL HIP ARTHROPLASTY Left 12/26/2017  Procedure: LEFT TOTAL HIP ARTHROPLASTY ANTERIOR APPROACH;  Surgeon: Vernetta Lonni GRADE, MD;  Location: [facility];  Service: Orthopedics;  Laterality: Left;  TOTAL KNEE ARTHROPLASTY Left 11/23/2021  Procedure: LEFT TOTAL KNEE ARTHROPLASTY;  Surgeon: Vernetta Lonni GRADE, MD;  Location: [facility];  Service: Orthopedics;  Laterality: Left; Rosina DELENA Downy 09/08/2023, 1:14 PM  IR Fluoro Guide CV Line Right Result Date: 09/01/2023 CLINICAL DATA:  End-stage renal disease in need for new tunneled hemodialysis catheter. Status post removal previously placed catheter on 08/29/2023 due  to bacteremia. EXAM: TUNNELED CENTRAL VENOUS HEMODIALYSIS CATHETER PLACEMENT WITH ULTRASOUND AND FLUOROSCOPIC GUIDANCE ANESTHESIA/SEDATION: Moderate (conscious) sedation was employed during this procedure. A total of Versed  1.5 mg and Fentanyl  50 mcg was administered intravenously. Moderate Sedation Time: 24 minutes. The patient's  level of consciousness and vital signs were monitored continuously by radiology nursing throughout the procedure under my direct supervision. MEDICATIONS: 2 g IV Ancef . FLUOROSCOPY: Radiation Exposure Index: 2.1 mGy Kerma PROCEDURE: The procedure, risks, benefits, and alternatives were explained to the patient. Questions regarding the procedure were encouraged and answered. The patient understands and consents to the procedure. A timeout was performed prior to initiating the procedure. The right neck and chest were prepped with chlorhexidine  in a sterile fashion, and a sterile drape was applied covering the operative field. Maximum barrier sterile technique with sterile gowns and gloves were used for the procedure. Local anesthesia was provided with 1% lidocaine . Ultrasound was performed to confirm patency of the right internal jugular vein. An ultrasound image was saved and recorded. After creating a small venotomy incision, a 21 gauge needle was advanced into the right internal jugular vein under direct, real-time ultrasound guidance. After securing guidewire access, an 8 Fr dilator was placed. A J-wire was kinked to measure appropriate catheter length. A Palindrome tunneled hemodialysis catheter measuring 19 cm from tip to cuff was chosen for placement. This was tunneled in a retrograde fashion from the chest wall to the venotomy incision. At the venotomy, serial dilatation was performed and a 15 Fr peel-away sheath was placed over a guidewire. The catheter was then placed through the sheath and the sheath removed. Final catheter positioning was confirmed and documented with a fluoroscopic spot image. The catheter was aspirated, flushed with saline, and injected with appropriate volume heparin  dwells. The venotomy incision was closed with subcutaneous and subcuticular 4-0 Vicryl. Dermabond was applied to the incision. The catheter exit site was secured with 0-Prolene retention sutures. COMPLICATIONS: None.  No  pneumothorax. FINDINGS: After catheter placement, the tip lies in the right atrium. The catheter aspirates normally and is ready for immediate use. IMPRESSION: Placement of tunneled hemodialysis catheter via the right internal jugular vein. The catheter tip lies in the right atrium. The catheter is ready for immediate use. Electronically Signed   By: Marcey Moan M.D.   On: 09/01/2023 12:16   IR US  Guide Vasc Access Right Result Date: 09/01/2023 CLINICAL DATA:  End-stage renal disease in need for new tunneled hemodialysis catheter. Status post removal previously placed catheter on 08/29/2023 due to bacteremia. EXAM: TUNNELED CENTRAL VENOUS HEMODIALYSIS CATHETER PLACEMENT WITH ULTRASOUND AND FLUOROSCOPIC GUIDANCE ANESTHESIA/SEDATION: Moderate (conscious) sedation was employed during this procedure. A total of Versed  1.5 mg and Fentanyl  50 mcg was administered intravenously. Moderate Sedation Time: 24 minutes. The patient's level of consciousness and vital signs were monitored continuously by radiology nursing throughout the procedure under my direct supervision. MEDICATIONS: 2 g IV Ancef . FLUOROSCOPY: Radiation Exposure Index: 2.1 mGy Kerma PROCEDURE: The procedure, risks, benefits, and alternatives were explained to the patient. Questions regarding the procedure were encouraged and answered. The patient understands and consents to the procedure. A timeout was performed prior to initiating the procedure. The right neck and chest were prepped with chlorhexidine  in a sterile fashion, and a sterile drape was applied covering the operative field. Maximum barrier sterile technique with sterile gowns and gloves were used for the procedure. Local anesthesia was provided with 1% lidocaine . Ultrasound was performed to confirm patency of the right internal jugular vein. An  ultrasound image was saved and recorded. After creating a small venotomy incision, a 21 gauge needle was advanced into the right internal jugular vein  under direct, real-time ultrasound guidance. After securing guidewire access, an 8 Fr dilator was placed. A J-wire was kinked to measure appropriate catheter length. A Palindrome tunneled hemodialysis catheter measuring 19 cm from tip to cuff was chosen for placement. This was tunneled in a retrograde fashion from the chest wall to the venotomy incision. At the venotomy, serial dilatation was performed and a 15 Fr peel-away sheath was placed over a guidewire. The catheter was then placed through the sheath and the sheath removed. Final catheter positioning was confirmed and documented with a fluoroscopic spot image. The catheter was aspirated, flushed with saline, and injected with appropriate volume heparin  dwells. The venotomy incision was closed with subcutaneous and subcuticular 4-0 Vicryl. Dermabond was applied to the incision. The catheter exit site was secured with 0-Prolene retention sutures. COMPLICATIONS: None.  No pneumothorax. FINDINGS: After catheter placement, the tip lies in the right atrium. The catheter aspirates normally and is ready for immediate use. IMPRESSION: Placement of tunneled hemodialysis catheter via the right internal jugular vein. The catheter tip lies in the right atrium. The catheter is ready for immediate use. Electronically Signed   By: Marcey Moan M.D.   On: 09/01/2023 12:16   DG Humerus Left Result Date: 09/01/2023 CLINICAL DATA:  Fracture.  Status post ORIF. EXAM: LEFT HUMERUS - 2+ VIEW COMPARISON:  08/05/2023 FINDINGS: Similar markedly abnormal appearance to the distal humerus status post cortical plate and screw fixation. Study is limited by positioning and as before, bony anatomy is markedly demineralized with possible fragmentation of the distal humerus at the elbow in areas of substantial lucency. IMPRESSION: Limited assessment shows no substantial interval change in fixation hardware although direct comparison between the two studies is difficult due to differential  positioning and markedly abnormal appearance of the distal humerus. As noted on the previous exam, given the marked loss of mineralized bone, infection is not excluded. Electronically Signed   By: Camellia Candle M.D.   On: 09/01/2023 12:05   IR Removal Tun Cv Cath W/O FL Result Date: 08/29/2023 INDICATION: Bacteremia with request for removal of tunneled catheter for line holiday. EXAM: REMOVAL OF TUNNELED HEMODIALYSIS CATHETER MEDICATIONS: None COMPLICATIONS: None immediate. PROCEDURE: Informed written consent was obtained from the patient following an explanation of the procedure, risks, benefits and alternatives to treatment. A time out was performed prior to the initiation of the procedure. Alcohol  was used to prep the patient's right neck, chest and existing catheter (patient has allergy to ChloraPrep and Betadine ). Using only gentle traction the catheter was removed intact. Hemostasis was obtained with manual compression. A dressing was placed. The patient tolerated the procedure well without immediate post procedural complication. IMPRESSION: Successful removal of tunneled dialysis catheter. Procedure performed by: Sari Lamp, PA-C Electronically Signed   By: Marcey Moan M.D.   On: 08/29/2023 16:11

## 2023-09-26 NOTE — Progress Notes (Signed)
 PROGRESS NOTE    Frank [name].  FMW:[id number] DOB: 15-Jan-1966 DOA: 07/03/2023 PCP: [name], MD   Brief Narrative: 58 year old PMH OSA CPAP, pathological fracture and multiple ORIF left Humerus likely due to multiple myeloma, A-fib not on anticoagulation, hyperlipidemia, tobacco use disorder who was feeling weak for 1 week, admitted to [facility] with concern of sepsis, community-acquired pneumonia and acute encephalopathy. Patient was found to have lytic lesions, he was transferred to [facility] for oncology treatment. In the meantime he developed encephalopathy, acute renal failure. He remained in the ICU for 36 days. Underwent tracheostomy. He was also started on HD after CRRT. Subsequently PEG tube placed 5/29.   Significant events 4/17-Admitted to medical floor with oncology consultation to treat multiple myeloma. 4/19-transferred to ICU intubated, encephalopathic. 4/20-A-fib with RVR. Started heparin . Groin and biopsy site hematoma requiring multiple transfusions. EEG 4/25 >> moderate to severe encephalopathy without any seizures or epileptiform discharges. 4/29 Received Velcade  and Cytoxan  chemotherapy-intent palliative? 5/2 LP and ETT exchange  5/8 Trach placed. Some post op bleeding improved with thrombi-pad 5/10 Voriconazole  added back based on CT Chest findings 5/13 more alert and able to follow some commands. 5/23, transferred to floor. 5/29-PEG placed 5/31 chemotherapy changed to Sarclisa /Kyprolis /Cytoxan . 6/13 plan to remove HD cathter for holiday line 6/16 new HD catheter placed 6/23 has not required HD for a week, HD catheter removed 6/28 finished first cycle of chemo, next cycle to start in 2 weeks 7/11: underwent IR drain placement of right thigh hip infected hematoma hematoma.   Assessment & Plan:   Principal Problem:   Severe sepsis with septic shock (HCC) Active Problems:   Aspergillosis, unspecified (HCC)   Multiple myeloma not having achieved  remission (HCC)   Lobar pneumonia, unspecified organism (HCC)   Closed fracture of left distal humerus   AKI (acute kidney injury) (HCC)   Acute hypoxemic respiratory failure (HCC)   PAF (paroxysmal atrial fibrillation) (HCC)   Acute metabolic encephalopathy   Protein-calorie malnutrition, severe   Hematoma of right thigh   Seizure disorder (HCC)   DMII (diabetes mellitus, type 2) (HCC)   Pressure injury of skin   CAD (coronary artery disease)   Chronic diastolic CHF (congestive heart failure) (HCC)   CKD (chronic kidney disease), stage IIIa (HCC)   Class 1 obesity   Asthma   Bipolar disorder (HCC)   Febrile neutropenia (HCC)   Generalized weakness   Anemia  Recurrent fevers, sepsis, Staph Epi Bacteremia - -Hospital course complicated by intermittent fevers, ID consulted and most recently evaluated patient 6/17.   -He had a fever and MRSE bacteremia 1/4 bottles, completed a course of vancomycin  with the end date 6/20.  He did receive a line holiday.  Repeat blood culture 6/9 without growth. - He has unstageable sacral ulcer, continue local wound care.     Aspergillosis with pneumonia -On Voriconazole  per ID, On Acyclovir .  End date for voriconazole  is 8/5, continue.   Right leg numbness -patient also weak, but that is more generalized due to being in bed for almost 3 months.   -CT scan without acute findings. MRI order. He can't tolerate MRI without sedation. Will hold for now. He was recently intubated and required trach this hospitalization.  -Hip CT: showed large hematoma gluteus medius and rectus femoris muscle   will repeat CT head.   Multiple myeloma -oncology following. Started on Velcade  and Cytoxan  by Oncology, transitioned to Kyprolis  then Sarclisa .   - Sarclisa  per Oncology -  Next dose chemo due ~7/14   Goals of care - Unfortunately poor long-term prognosis. Palliative care has discussed with wife previously.  Remain full code on full scope of care.  Awaiting SNF  placement but he needs to be able to transfer better and be able to sit up in the chair for outpatient chemo which he is unable to do currently   Acute Hypoxic Respiratory Failure -  trach removed 6/11.  Respiratory status stable.   Right Thigh Hematoma -Infected.   per CT on 08/01/23: 12.1 x 30.2 x 7.6 cm subcutaneous hematoma within the anterior right thigh. 8.4 x 10.4 x 22.2 cm subcutaneous hematoma within the right gluteal region - continue supportive care.  Hemoglobin stable Ortho consulted, repeat CT hip showed persistent large fluid collection. Otho consulted, recommend drain placement by IR>  Underwent IR drain placement, for infected Hematoma.  Will start IV Vancomycin .   Severe acute metabolic encephalopathy with hypoactive delirium, seizure disorder - Normal MRI of the brain.  LP negative for meningitis or encephalitis.  EEG negative for seizures but encephalopathy.  -Home Depakote  discontinued in ICU. -Improved.   Insulin -dependent diabetes type 2 with hyperglycemia- A1c 6.0, continue with sliding scale insulin .  Hypoglycemia now resolved   AKI on CKD stage IIIa, Uremia,  Hyponatremia, Hypokalemia -Due to MM, hypercalcemia and ACE inhibitors?  Nephrology consulted, was on hemodialysis but eventually he tolerated being off HD, tunneled catheter discontinued per nephrology.  Renal function is stable   Paroxysmal A-fib - Unable to  anticoagulation due to hematoma.  Rate controlled   Obstructive Sleep Apnea -Trach removed   Anxiety/Bipolar disorder - Continue with Paxil .     Severe Malnutrition - Continue tube feeding.  He is allowed dysphagia 1 per speech   History of Pathological fracture and multiple ORIF left Humerus 01/2023  -Dr. Jonel discussed with orthopedics on 6/26, options would involve either a substantial reconstruction with antibiotic cement and prolonged, difficult recovery, or above elbow amputation.  [name] recommends more rehabilitation at this time prior to  attempting surgery. - Maintain left arm splint when out of bed - [name] following, since he is not clear when he will discharge likely to have surgery here in couple of weeks.  Date to be determined   Hypokalemia -potassium now normalized.  Hyponatremia -stable.    See wound care documentation below.  Pressure Injury 07/05/23 Heel Left;Right Unstageable - Full thickness tissue loss in which the base of the injury is covered by slough (yellow, tan, gray, green or brown) and/or eschar (tan, brown or black) in the wound bed. (Active)  07/05/23 1108  Location: Heel  Location Orientation: Left;Right  Staging: Unstageable - Full thickness tissue loss in which the base of the injury is covered by slough (yellow, tan, gray, green or brown) and/or eschar (tan, brown or black) in the wound bed.  Wound Description (Comments):   DO NOT USE:  Present on Admission: Yes  Dressing Type Foam - Lift dressing to assess site every shift 09/26/23 1036     Pressure Injury 07/11/23 Sacrum Mid Unstageable - Full thickness tissue loss in which the base of the injury is covered by slough (yellow, tan, gray, green or brown) and/or eschar (tan, brown or black) in the wound bed. (Active)  07/11/23 1600  Location: Sacrum  Location Orientation: Mid  Staging: Unstageable - Full thickness tissue loss in which the base of the injury is covered by slough (yellow, tan, gray, green or brown) and/or eschar (tan, brown  or black) in the wound bed.  Wound Description (Comments):   DO NOT USE:  Present on Admission: No  Dressing Type Foam - Lift dressing to assess site every shift 09/26/23 1036     Pressure Injury 07/27/23 Buttocks Right;Lower (Active)  07/27/23 1700  Location: Buttocks  Location Orientation: Right;Lower  Staging:   Wound Description (Comments):   DO NOT USE:  Present on Admission: Yes  Dressing Type Foam - Lift dressing to assess site every shift 09/26/23 1036     Pressure Injury 08/25/23 Buttocks  Left Stage 2 -  Partial thickness loss of dermis presenting as a shallow open injury with a red, pink wound bed without slough. (Active)  08/25/23   Location: Buttocks  Location Orientation: Left  Staging: Stage 2 -  Partial thickness loss of dermis presenting as a shallow open injury with a red, pink wound bed without slough.  Wound Description (Comments):   DO NOT USE:  Present on Admission: No  Dressing Type Foam - Lift dressing to assess site every shift 09/25/23 2000     Nutrition Problem: Severe Malnutrition Etiology: acute illness (prolonged illness, interuptions in feeding)    Signs/Symptoms: moderate fat depletion, moderate muscle depletion, percent weight loss (9.6% x 1 month) Percent weight loss: 9.6 %    Interventions: Prostat, Tube feeding, MVI  Estimated body mass index is 30.16 kg/m as calculated from the following:   Height as of this encounter: 6' 0.01 (1.829 m).   Weight as of this encounter: 100.9 kg.   DVT prophylaxis: Lovenox .  Code Status: Full code Family Communication: Wife 7/10 Disposition Plan:  Status is: Inpatient Remains inpatient appropriate because: needs to be able sit up to be able to go SNF and out patient chemo    Consultants:  Oncology Nephrology  Orthopedic.    Procedures:  HD Chemotherapy  Antimicrobials:    Subjective: Came from IR, drain in placed. He report significant improvement of LE numbness.   Objective: Vitals:   09/26/23 0856 09/26/23 0915 09/26/23 0920 09/26/23 0927  BP: (!) 157/107 (!) 111/90 (!) 117/54 103/81  Pulse: 93 89 91 90  Resp: 10 16 14 15   Temp:      TempSrc:      SpO2: 97% 100% 99% 100%  Weight:      Height:        Intake/Output Summary (Last 24 hours) at 09/26/2023 1131 Last data filed at 09/25/2023 2000 Gross per 24 hour  Intake --  Output 2250 ml  Net -2250 ml   Filed Weights   09/24/23 0500 09/25/23 0500 09/26/23 0500  Weight: 97.4 kg 98.2 kg 100.9 kg     Examination:  General exam: NAD Respiratory system: CTA Cardiovascular system: S 1, S 2 RRR Gastrointestinal system: BS present, soft, nt Central nervous system: sleepy, answer questions.  Extremities: Trace edema    Data Reviewed: I have personally reviewed following labs and imaging studies  CBC: Recent Labs  Lab 09/22/23 1030 09/23/23 0821 09/24/23 0806 09/25/23 1432 09/26/23 0447  WBC 6.6 4.5 5.4 5.2 4.7  NEUTROABS 3.6 2.6 3.1 2.4 2.2  HGB 8.7* 8.5* 8.8* 7.7* 8.0*  HCT 25.4* 25.9* 25.9* 23.4* 24.0*  MCV 107.6* 107.9* 107.9* 108.3* 107.1*  PLT 266 275 254 284 261   Basic Metabolic Panel: Recent Labs  Lab 09/20/23 0500 09/21/23 0330 09/23/23 0821 09/23/23 0822 09/24/23 0806 09/24/23 0809 09/25/23 1432 09/26/23 0447  NA 130* 129* 132* 133* 130* 130* 128* 129*  K 4.4 4.1 4.0  4.1 4.1 4.1 4.6 4.3  CL 99 99 102 102 98 99 98 97*  CO2 22 22 21* 25 22 22  19* 22  GLUCOSE 87 129* 143* 141* 123* 120* 127* 88  BUN 54* 56* 43* 45* 41* 41* 42* 43*  CREATININE 0.95 0.95 0.94 0.94 0.98 1.01 0.93 0.89  CALCIUM  8.2* 8.4* 8.2* 8.3* 8.2* 8.3* 7.8* 8.1*  MG 2.2  --  2.0  --  1.9  --   --   --   PHOS 4.0 4.1  --  4.2  --  4.6  --   --    GFR: Estimated Creatinine Clearance: 109.8 mL/min (by C-G formula based on SCr of 0.89 mg/dL). Liver Function Tests: Recent Labs  Lab 09/23/23 0821 09/23/23 0822 09/24/23 0806 09/24/23 0809 09/25/23 1432 09/26/23 0447  AST 11*  --  12*  --  9* 11*  ALT 8  --  7  --  7 7  ALKPHOS 93  --  84  --  79 76  BILITOT 0.4  --  0.4  --  0.4 0.4  PROT 5.2*  --  5.3*  --  5.2* 5.3*  ALBUMIN  2.0* 2.0* 2.1* 2.1* 1.9* 1.9*   No results for input(s): LIPASE, AMYLASE in the last 168 hours. No results for input(s): AMMONIA in the last 168 hours. Coagulation Profile: No results for input(s): INR, PROTIME in the last 168 hours. Cardiac Enzymes: No results for input(s): CKTOTAL, CKMB, CKMBINDEX, TROPONINI in the last 168  hours. BNP (last 3 results) No results for input(s): PROBNP in the last 8760 hours. HbA1C: No results for input(s): HGBA1C in the last 72 hours. CBG: Recent Labs  Lab 09/25/23 1116 09/25/23 1246 09/25/23 1612 09/25/23 2120 09/26/23 0619  GLUCAP 139* 150* 119* 140* 86   Lipid Profile: No results for input(s): CHOL, HDL, LDLCALC, TRIG, CHOLHDL, LDLDIRECT in the last 72 hours. Thyroid Function Tests: No results for input(s): TSH, T4TOTAL, FREET4, T3FREE, THYROIDAB in the last 72 hours. Anemia Panel: No results for input(s): VITAMINB12, FOLATE, FERRITIN, TIBC, IRON, RETICCTPCT in the last 72 hours. Sepsis Labs: No results for input(s): PROCALCITON, LATICACIDVEN in the last 168 hours.  No results found for this or any previous visit (from the past 240 hours).       Radiology Studies: US  IMAGE GUIDED FLUID DRAIN BY CATHETER Result Date: 09/26/2023 CLINICAL DATA:  Large right gluteal fluid collection likely evolving hematoma or potential abscess EXAM: US  IMAGE GUIDED FLUID DRAIN BY CATHETER TECHNIQUE: Ultrasound guidance COMPARISON:  None Available. FINDINGS: With the patient in a supine position, the right gluteal region and hip region were evaluated with ultrasound. A large fluid collection corresponding with previous visualized fluid collection is again seen. The patient was prepped and draped in usual sterile fashion. Local anesthesia was achieved by infiltrating subcutaneous tissue with 1% lidocaine . A small incision was made and then a 7 cm Yueh needle was advanced under ultrasound guidance from the incision to midpoint of the fluid collection. The needle was removed and short Amplatz wire was advanced under ultrasound guidance and coiled within the fluid collection. The Yueh catheter was then removed and the access site was dilated with a 10 French fascial dilator. A 10 French pigtail catheter was then advanced over the guidewire and coiled  within the fluid collection. Locking mechanism engaged. Retention suture and sterile dressing applied. Small sample was obtained and sent to pathology for aerobic and anaerobe evaluation. IMPRESSION: Satisfactory 10 French drainage catheter placed in a right  hip abscess. Fluid appears to be infected hematoma. Electronically Signed   By: Cordella Banner   On: 09/26/2023 10:41   CT HEAD WO CONTRAST ( ) Result Date: 09/25/2023 CLINICAL DATA:  Initial evaluation for acute neuro deficit, stroke suspected. EXAM: CT HEAD WITHOUT CONTRAST TECHNIQUE: Contiguous axial images were obtained from the base of the skull through the vertex without intravenous contrast. RADIATION DOSE REDUCTION: This exam was performed according to the departmental dose-optimization program which includes automated exposure control, adjustment of the mA and/or kV according to patient size and/or use of iterative reconstruction technique. COMPARISON:  CT from 09/22/2023. FINDINGS: Brain: Cerebral volume within normal limits. Patchy hypodensity involving the supratentorial cerebral white matter, most characteristic of chronic microvascular ischemic disease, moderate in nature. No acute intracranial hemorrhage. No acute large vessel territory infarct. No mass lesion, midline shift or mass effect. No hydrocephalus or extra-axial fluid collection. Vascular: No abnormal hyperdense vessel. Calcified atherosclerosis present at the skull base. Skull: Scalp soft tissues demonstrate no acute finding. Multiple lucent lesions again noted throughout the calvarium and visualized skull base, consistent with history of multiple myeloma. Sinuses/Orbits: Globes orbital soft tissues within normal limits. Mild mucosal thickening noted about the right maxillary sinus. Minimal pneumatized secretions noted within the right sphenoid sinus. Large right mastoid and middle ear effusion, with small left mastoid effusion. Imaged nasopharynx grossly unremarkable. Other:  None. IMPRESSION: 1. No acute intracranial abnormality. 2. Moderate chronic microvascular ischemic disease. 3. Multiple lucent lesions throughout the calvarium and visualized skull base, consistent with history of multiple myeloma. 4. Large right mastoid and middle ear effusion, with small left mastoid effusion. Findings of uncertain significance, however, correlation with physical exam for possible acute otomastoiditis recommended. Electronically Signed   By: Morene Hoard M.D.   On: 09/25/2023 20:46   CT HIP RIGHT WO CONTRAST Result Date: 09/25/2023 CLINICAL DATA:  Right hip pain EXAM: CT OF THE RIGHT HIP WITHOUT CONTRAST TECHNIQUE: Multidetector CT imaging of the right hip was performed according to the standard protocol. Multiplanar CT image reconstructions were also generated. RADIATION DOSE REDUCTION: This exam was performed according to the departmental dose-optimization program which includes automated exposure control, adjustment of the mA and/or kV according to patient size and/or use of iterative reconstruction technique. COMPARISON:  CT right femur 08/01/2023 FINDINGS: Bones/Joint/Cartilage Moderate to prominent axial and craniocaudad chondral thinning in the right hip associated with substantial spurring of the right femoral head. Mildly aspherical femoral head morphology may predispose to cam type femoroacetabular impingement. Scattered degenerative subcortical cystic lesions along the right femoral head and acetabulum. Scattered lucencies in the marrow of the visualized bony structures including the right iliac bone, ischium, and right proximal femur. Entities such as myeloma are not excluded. Similar lesions have been reported in other bony structures example in cranial structures on the MRI head from 07/26/2023. Ligaments Suboptimally assessed by CT. Muscles and Tendons Moderate regional muscular atrophy. Soft tissues Extending partially along the superficial fascia margin of the gluteus  medius and rectus femoris muscle, there is a large fluid collection which extends above and below the margins of today's CT scan. This measures 12.9 by 6.3 cm in cross-section on image 93 of series 5 and was also present on 08/01/2023, tracking cephalad on that exam all the way up to the level of the iliac crests. Appearance favors chronic hematoma or possibly a Morel-Lavalle lesion given the lack of resolution. Right iliac artery atherosclerosis. IMPRESSION: 1. 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 Mildly aspherical femoral head  morphology may predispose to cam type femoroacetabular impingement. 2. Scattered lucencies in the marrow of the visualized bony structures including the right iliac bone, ischium, and right proximal femur. Entities such as myeloma are not excluded. Similar lesions have been reported in other bony structures example in cranial structures on the MRI head from 07/26/2023. 3. Large fluid collection extending partially along the superficial fascia margin of the gluteus medius and rectus femoris muscle, also present on 08/01/2023, tracking cephalad on that exam all the way up to the level of the iliac crests. Appearance favors chronic hematoma or possibly a Morel-Lavallee lesion given the lack of resolution. 4. Moderate regional muscular atrophy. 5. Right iliac artery atherosclerosis. Electronically Signed   By: Ryan Salvage M.D.   On: 09/25/2023 08:00         Scheduled Meds:  acyclovir   400 mg Oral BID   collagenase    Topical Daily   enoxaparin  (LOVENOX ) injection  50 mg Subcutaneous Daily   feeding supplement  237 mL Oral BID WC   feeding supplement (PROSource TF20)  60 mL Per Tube BID   free water   50 mL Per Tube Daily   insulin  aspart  0-15 Units Subcutaneous TID AC & HS   insulin  aspart  2 Units Subcutaneous TID AC & HS   insulin  glargine-yfgn  10 Units Subcutaneous Q24H   montelukast   5 mg Oral QHS   multivitamin with minerals  1 tablet Oral Daily    nutrition supplement (JUVEN)  1 packet Oral BID BM   mouth rinse  15 mL Mouth Rinse 4 times per day   PARoxetine   40 mg Oral Daily   polyethylene glycol  17 g Oral BID   senna  1 tablet Oral Daily   sodium chloride  flush  10-40 mL Intracatheter Q12H   umeclidinium-vilanterol  1 puff Inhalation Daily   valproic  acid  750 mg Oral BID   voriconazole   400 mg Oral Q12H   Continuous Infusions:  sodium chloride  Stopped (08/28/23 1208)   sodium chloride  Stopped (08/28/23 0839)     LOS: 85 days    Time spent: 35 minutes    Kyron Schlitt A Yaseen Gilberg, MD [facility]   If 7PM-7AM, please contact night-coverage [email or web address]  09/26/2023, 11:31 AM

## 2023-09-26 NOTE — Consult Note (Signed)
 WOC Nurse wound follow up  WTA received communication from PT wound care reporting blue drainage from patient's sacral wound.  Will modify wound care order as follows:  Sacrum: Cleanse wound with NS allow to air dry, apply Dakin's soaked gauze to wound, cover with foam or abd pad (which ever preferred)  change daily and PRN soiling.  R buttocks/ L buttocks: cleanse wound with Vashe Soila # [phone]) allow to air dry and cover with foam dressing, lift dressing daily to view wound and cleanse and change foam dressing Q 3 days and PRN soiling.    Thank you,  Doyal Polite, RN, MSN, [facility] WOC Team

## 2023-09-26 NOTE — Progress Notes (Signed)
 Occupational Therapy Treatment Patient Details Name: Frank [name]. MRN: [id number] DOB: 1965/09/25 Today's Date: 09/26/2023   History of present illness 58 y.o. male transferred from [facility] 07/03/23 to [facility] for management of newly diagnosed plasma cell myeloma with weakness and AMS. Pt also with AKI and metabolic encephalopathy. 5/10 transfer to [facility] for iHD. 4/22 Intubated and chemo initiated. 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 5/6 IVIG started. 5/8-6/11 trach. 5/12 iHD initiated. 5/29 PEG placed. 6/5 chemo initiated. PMHx:Lt THA, carpal tunnel syndrome, Lt TKA, Lt humerus fx, PAF, HTN, HLD, bipolar disorder, obesity, T2DM   OT comments  Pt is making limited but steady progress towards their acute OT goals, care plan goals updated. Session completed at bed level. Pt verbalized good awareness of medical complexity, current functional level and was able to discuss obtainable short term and long term goals. He uses his RUE for ADLs at bed level with set up for feeding and grooming, however he remains weak. OT to continue to follow acutely to facilitate progress towards established goals. Pt will continue to benefit from skilled inpatient follow up therapy, <3 hours/day - unless family can pt home at bed level and use DME for transfers and ADLs.  Patient centered goals: Short term, to get into [facility] Monday 7/14. Long term, to attend daughters wedding in October and see his new grandson.       If plan is discharge home, recommend the following:  Two people to help with walking and/or transfers;Assistance with cooking/housework;Assistance with feeding;Direct supervision/assist for medications management;Direct supervision/assist for financial management;Assist for transportation;Help with stairs or ramp for entrance;A lot of help with bathing/dressing/bathroom   Equipment Recommendations  Other (comment)       Precautions / Restrictions Precautions Precautions: Fall Recall of  Precautions/Restrictions: Impaired Precaution/Restrictions Comments: episode of syncope, sacral wound, chemo. Lt humerus non-union, splinted. no elbow ROM, but shoulder ROM ok per secure chat with Dr. Jonel 7/1 Required Braces or Orthoses: Sling Restrictions Weight Bearing Restrictions Per Provider Order: Yes LUE Weight Bearing Per Provider Order: Non weight bearing        ADL either performed or assessed with clinical judgement   ADL Overall ADL's : Needs assistance/impaired Eating/Feeding: Set up;Bed level   Grooming: Wash/dry hands;Bed level;Set up             General ADL Comments: session focused on current functional level and goal update. pt is able to use RUE functionall with good ROM but remains globally weak    Extremity/Trunk Assessment Upper Extremity Assessment Upper Extremity Assessment: RUE deficits/detail;LUE deficits/detail RUE Deficits / Details: Generalized weakness, pt using functionally for grooming and eating at bed level RUE Sensation: WNL LUE Deficits / Details: Lt humerus non-union, splinted. no elbow ROM, but shoulder ROM ok per secure chat with Dr. Jonel 7/1 - per pt, they are planning for an amputation   Lower Extremity Assessment Lower Extremity Assessment: Defer to PT evaluation                 Communication Communication Communication: Impaired Factors Affecting Communication: Reduced clarity of speech   Cognition Arousal: Alert Behavior During Therapy: [facility] for tasks assessed/performed Cognition: Cognition impaired     Awareness: Intellectual awareness intact   Attention impairment (select first level of impairment): Sustained attention Executive functioning impairment (select all impairments): Problem solving OT - Cognition Comments: Follows simple one step commands. pt verbalized fairly good awareness of medical situation and obtainable goals. He is motivated  Following commands: Impaired Following  commands impaired: Follows one step commands with increased time      Cueing   Cueing Techniques: Verbal cues, Tactile cues, Visual cues        General Comments VSS    Pertinent Vitals/ Pain       Pain Assessment Pain Assessment: Faces Faces Pain Scale: Hurts even more Pain Location: LUE   Frequency  Min 1X/week (*STAR patient*)        Progress Toward Goals  OT Goals(current goals can now be found in the care plan section)  Progress towards OT goals: Progressing toward goals  Acute Rehab OT Goals Patient Stated Goal: to get better OT Goal Formulation: With patient Time For Goal Achievement: 10/09/23 Potential to Achieve Goals: Fair ADL Goals Pt Will Perform Grooming: with supervision;with set-up;sitting Pt Will Perform Upper Body Dressing: with contact guard assist;sitting Pt Will Transfer to Toilet: bedside commode Pt/caregiver will Perform Home Exercise Program: Right Upper extremity;Increased strength;With written HEP provided;Independently Additional ADL Goal #1: Pt will complete bed mobility with mod A as a precursor to EOB ADLs   AM-PAC OT 6 Clicks Daily Activity     Outcome Measure   Help from another person eating meals?: A Little Help from another person taking care of personal grooming?: A Little Help from another person toileting, which includes using toliet, bedpan, or urinal?: Total Help from another person bathing (including washing, rinsing, drying)?: A Lot Help from another person to put on and taking off regular upper body clothing?: A Lot Help from another person to put on and taking off regular lower body clothing?: Total 6 Click Score: 12    End of Session    OT Visit Diagnosis: Unsteadiness on feet (R26.81);Other abnormalities of gait and mobility (R26.89);Muscle weakness (generalized) (M62.81);Pain Pain - Right/Left: Right Pain - part of body: Knee;Arm   Activity Tolerance Patient tolerated treatment well;Patient limited by pain    Patient Left in bed;with call bell/phone within reach;with family/visitor present   Nurse Communication Mobility status        Time: 1450-1502 OT Time Calculation (min): 12 min  Charges: OT General Charges $OT Visit: 1 Visit OT Treatments $Self Care/Home Management : 8-22 mins  Lucie Kendall, OTR/L Acute Rehabilitation Services Office [phone] Secure Chat Communication Preferred   Lucie JONETTA Kendall 09/26/2023, 3:15 PM

## 2023-09-26 NOTE — Progress Notes (Signed)
 This chaplain is present with the Pt. for F/U spiritual care. The Pt. is awake and celebrating the draining of right thigh hip and today's increased mobility.   The chaplain listened reflectively as the Pt. optimistically described the plans for 5 days of PT next week and the possibility of moving closer to his goal-spending time with his grand children. The Pt. shares he tries not to let himself think of anything else but getting better and often looks at the family pictures on the wall for encouragement.  The Pt. is anticipating a visit from family on Sunday.  This chaplain is available for F/U spiritual care as needed.  Chaplain Leeroy Hummer [phone]

## 2023-09-26 NOTE — TOC Progression Note (Signed)
 Transition of Care (TOC) - Progression Note    Patient Details  Name: Colyn Miron. MRN: [id number] Date of Birth: 1965-07-27  Transition of Care [facility]) CM/SW Contact  Almarie CHRISTELLA Goodie, [place] Phone Number: 09/26/2023, 2:41 PM  Clinical Narrative:   CSW continuing to follow for SNF placement when able.    Expected Discharge Plan: Skilled Nursing Facility Barriers to Discharge: Continued Medical Work up  Expected Discharge Plan and Services In-house Referral: Clinical Social Work Discharge Planning Services: CM Consult Post Acute Care Choice: Skilled Nursing Facility Living arrangements for the past 2 months: Single Family Home                 DME Arranged: N/A DME Agency: NA       HH Arranged: NA HH Agency: NA         Social Determinants of Health (SDOH) Interventions SDOH Screenings   Food Insecurity: No Food Insecurity (07/05/2023)  Housing: Low Risk  (07/05/2023)  Transportation Needs: No Transportation Needs (07/05/2023)  Utilities: Not At Risk (07/05/2023)  Tobacco Use: High Risk (08/30/2023)    Readmission Risk Interventions     No data to display

## 2023-09-26 NOTE — Progress Notes (Signed)
 Speech Language Pathology Treatment: Dysphagia  Patient Details Name: Frank [name]. MRN: [id number] DOB: May 23, 1965 Today's Date: 09/26/2023 Time: 1420-1440 SLP Time Calculation (min) (ACUTE ONLY): 20 min  Assessment / Plan / Recommendation Clinical Impression  Mr. Veiga has been requesting diet advancement from purees.  He was talkative, very animated and participatory today.  Wife was on speaker phone. He has done very well with a pureed diet since 6/24 with no adverse consequences re: respiratory function. He consumed trial of dysphagia 2 (chopped peaches), eating entire container with no clinical s/s of aspiration or potential residue.  Recommend advancing diet to dysphagia 2, thin liquids. He should continue to clear throat occasionally and alternate solids and liquids.  D/W pt, wife, and Charity fundraiser.  SLP will follow.    HPI HPI: 58 y.o. male transferred from [facility] 07/03/23 to [facility] for management of newly diagnosed plasma cell myeloma with weakness and AMS. Pt also with AKI and metabolic encephalopathy. 5/10 transfer to [facility] for iHD.Bedside swallow eval 4/18 with recs for NPO due to somnolence. 4/22 Intubated and chemo initiated. 4/23-5/10 CRRT. 5/6 IVIG started. 5/8 trach. 5/12 iHD initiated. 5/18 inability to oxygenate through trach, intubated and trach revision. Subsequently decannulated. PEG. PMHx:Lt THA, carpal tunnel syndrome, Lt TKA, Lt humerus fx, PAF, HTN, HLD, bipolar disorder, obesity, T2DM.      SLP Plan  Continue with current plan of care          Recommendations  Diet recommendations: Dysphagia 2 (fine chop);Thin liquid Liquids provided via: Cup;Straw Medication Administration: Via alternative means Supervision: Patient able to self feed;Full supervision/cueing for compensatory strategies Compensations: Slow rate;Small sips/bites;Follow solids with liquid (clear throat intermittently) Postural Changes and/or Swallow Maneuvers: Seated upright 90 degrees                   Oral care BID     Dysphagia, oropharyngeal phase (R13.12)     Continue with current plan of care    Anelis Hrivnak L. Vona, MA CCC/SLP Clinical Specialist - Acute Care SLP Acute Rehabilitation Services Office number [phone]  [name]  09/26/2023, 3:24 PM

## 2023-09-27 DIAGNOSIS — R6521 Severe sepsis with septic shock: Secondary | ICD-10-CM | POA: Diagnosis not present

## 2023-09-27 DIAGNOSIS — A419 Sepsis, unspecified organism: Secondary | ICD-10-CM | POA: Diagnosis not present

## 2023-09-27 LAB — GLUCOSE, CAPILLARY
Glucose-Capillary: 79 mg/dL (ref 70–99)
Glucose-Capillary: 98 mg/dL (ref 70–99)
Glucose-Capillary: 134 mg/dL — ABNORMAL HIGH (ref 70–99)
Glucose-Capillary: 151 mg/dL — ABNORMAL HIGH (ref 70–99)

## 2023-09-27 LAB — CBC WITH DIFFERENTIAL/PLATELET
Abs Immature Granulocytes: 0.01 K/uL (ref 0.00–0.07)
Basophils Absolute: 0 K/uL (ref 0.0–0.1)
Basophils Relative: 0 %
Eosinophils Absolute: 0 K/uL (ref 0.0–0.5)
Eosinophils Relative: 1 %
HCT: 24 % — ABNORMAL LOW (ref 39.0–52.0)
Hemoglobin: 7.9 g/dL — ABNORMAL LOW (ref 13.0–17.0)
Immature Granulocytes: 0 %
Lymphocytes Relative: 24 %
Lymphs Abs: 1.1 K/uL (ref 0.7–4.0)
MCH: 36.1 pg — ABNORMAL HIGH (ref 26.0–34.0)
MCHC: 32.9 g/dL (ref 30.0–36.0)
MCV: 109.6 fL — ABNORMAL HIGH (ref 80.0–100.0)
Monocytes Absolute: 0.9 K/uL (ref 0.1–1.0)
Monocytes Relative: 19 %
Neutro Abs: 2.5 K/uL (ref 1.7–7.7)
Neutrophils Relative %: 56 %
Platelets: 262 K/uL (ref 150–400)
RBC: 2.19 MIL/uL — ABNORMAL LOW (ref 4.22–5.81)
RDW: 19.9 % — ABNORMAL HIGH (ref 11.5–15.5)
WBC: 4.5 K/uL (ref 4.0–10.5)
nRBC: 0 % (ref 0.0–0.2)

## 2023-09-27 LAB — COMPREHENSIVE METABOLIC PANEL WITH GFR
ALT: 6 U/L (ref 0–44)
AST: 9 U/L — ABNORMAL LOW (ref 15–41)
Albumin: 1.9 g/dL — ABNORMAL LOW (ref 3.5–5.0)
Alkaline Phosphatase: 71 U/L (ref 38–126)
Anion gap: 9 (ref 5–15)
BUN: 42 mg/dL — ABNORMAL HIGH (ref 6–20)
CO2: 22 mmol/L (ref 22–32)
Calcium: 8.3 mg/dL — ABNORMAL LOW (ref 8.9–10.3)
Chloride: 101 mmol/L (ref 98–111)
Creatinine, Ser: 0.93 mg/dL (ref 0.61–1.24)
GFR, Estimated: >60 mL/min (ref >60)
Glucose, Bld: 133 mg/dL — ABNORMAL HIGH (ref 70–99)
Potassium: 4.5 mmol/L (ref 3.5–5.1)
Sodium: 132 mmol/L — ABNORMAL LOW (ref 135–145)
Total Bilirubin: 0.4 mg/dL (ref 0.0–1.2)
Total Protein: 5.3 g/dL — ABNORMAL LOW (ref 6.5–8.1)

## 2023-09-27 MED ORDER — PANTOPRAZOLE SODIUM 40 MG PO TBEC
40.0000 mg | DELAYED_RELEASE_TABLET | Freq: Two times a day (BID) | ORAL | Status: DC
  Administered 2023-09-27 – 2023-11-07 (×88): 40 mg via ORAL
  Filled 2023-09-27 (×82): qty 1

## 2023-09-27 MED ORDER — FERROUS SULFATE 325 (65 FE) MG PO TABS
325.0000 mg | ORAL_TABLET | Freq: Every day | ORAL | Status: DC
  Administered 2023-09-27 – 2023-10-12 (×16): 325 mg via ORAL
  Filled 2023-09-27 (×16): qty 1

## 2023-09-27 NOTE — Progress Notes (Signed)
 PROGRESS NOTE    Frank [name].  FMW:[id number] DOB: 02/08/1966 DOA: 07/03/2023 PCP: Frank Clem Pitts, MD   Brief Narrative: 58 year old PMH OSA CPAP, pathological fracture and multiple ORIF left Humerus likely due to multiple myeloma, A-fib not on anticoagulation, hyperlipidemia, tobacco use disorder who was feeling weak for 1 week, admitted to [facility] health with concern of sepsis, community-acquired pneumonia and acute encephalopathy. Patient was found to have lytic lesions, he was transferred to [facility] for oncology treatment. In the meantime he developed encephalopathy, acute renal failure. He remained in the ICU for 36 days. Underwent tracheostomy. He was also started on HD after CRRT. Subsequently PEG tube placed 5/29.   Significant events 4/17-Admitted to medical floor with oncology consultation to treat multiple myeloma. 4/19-transferred to ICU intubated, encephalopathic. 4/20-A-fib with RVR. Started heparin . Groin and biopsy site hematoma requiring multiple transfusions. EEG 4/25 >> moderate to severe encephalopathy without any seizures or epileptiform discharges. 4/29 Received Velcade  and Cytoxan  chemotherapy-intent palliative? 5/2 LP and ETT exchange  5/8 Trach placed. Some post op bleeding improved with thrombi-pad 5/10 Voriconazole  added back based on CT Chest findings 5/13 more alert and able to follow some commands. 5/23, transferred to floor. 5/29-PEG placed 5/31 chemotherapy changed to Sarclisa /Kyprolis /Cytoxan . 6/13 plan to remove HD cathter for holiday line 6/16 new HD catheter placed 6/23 has not required HD for a week, HD catheter removed 6/28 finished first cycle of chemo, next cycle to start in 2 weeks 7/11: underwent IR drain placement of right thigh hip infected hematoma hematoma.   Assessment & Plan:   Principal Problem:   Severe sepsis with septic shock (HCC) Active Problems:   Aspergillosis, unspecified (HCC)   Multiple myeloma not having achieved  remission (HCC)   Lobar pneumonia, unspecified organism (HCC)   Closed fracture of left distal humerus   AKI (acute kidney injury) (HCC)   Acute hypoxemic respiratory failure (HCC)   PAF (paroxysmal atrial fibrillation) (HCC)   Acute metabolic encephalopathy   Protein-calorie malnutrition, severe   Hematoma of right thigh   Seizure disorder (HCC)   DMII (diabetes mellitus, type 2) (HCC)   Pressure injury of skin   CAD (coronary artery disease)   Chronic diastolic CHF (congestive heart failure) (HCC)   CKD (chronic kidney disease), stage IIIa (HCC)   Class 1 obesity   Asthma   Bipolar disorder (HCC)   Febrile neutropenia (HCC)   Generalized weakness   Anemia  Recurrent fevers, sepsis, Staph Epi Bacteremia - -Hospital course complicated by intermittent fevers, ID consulted and most recently evaluated patient 6/17.   -He had a fever and MRSE bacteremia 1/4 bottles, completed a course of vancomycin  with the end date 6/20.  He did receive a line holiday.  Repeat blood culture 6/9 without growth. - He has unstageable sacral ulcer, continue local wound care.      Right Thigh Hematoma -Infected.   per CT on 08/01/23: 12.1 x 30.2 x 7.6 cm subcutaneous hematoma within the anterior right thigh. 8.4 x 10.4 x 22.2 cm subcutaneous hematoma within the right gluteal region - continue supportive care.  Hemoglobin stable Ortho consulted, repeat CT hip showed persistent large fluid collection. Otho consulted, recommend drain placement by IR>  Underwent IR drain placement, for infected Hematoma.  Started  IV Vancomycin , flagyl  and Ceftriaxone . Will need ID consult. Follow cultures.   Right leg numbness : Improved after hip hematoma drained.  -CT scan without acute findings. MRI order. He can't tolerate MRI without sedation. Will hold  for now. He was recently intubated and required trach this hospitalization.  -Hip CT: showed large hematoma gluteus medius and rectus femoris muscle  -Repeated CT head  negative. Denies ear pain or pressure.   Multiple myeloma -oncology following. Started on Velcade  and Cytoxan  by Oncology, transitioned to Kyprolis  then Sarclisa .   - Sarclisa  per Oncology - Next dose chemo due ~7/14---if clear by Dr Frank [name] in setting of hematoma infection.    Aspergillosis with pneumonia -On Voriconazole  per ID, On Acyclovir .  End date for voriconazole  is 8/5, continue.  Acute Hypoxic Respiratory Failure -  trach removed 6/11.  Respiratory status stable.   Severe acute metabolic encephalopathy with hypoactive delirium, seizure disorder - Normal MRI of the brain.  LP negative for meningitis or encephalitis.  EEG negative for seizures but encephalopathy.  -Home Depakote  discontinued in ICU. -Improved.   Insulin -dependent diabetes type 2 with hyperglycemia- A1c 6.0, continue with sliding scale insulin .  Hypoglycemia now resolved   AKI on CKD stage IIIa, Uremia,  Hyponatremia, Hypokalemia -Due to MM, hypercalcemia and ACE inhibitors?  Nephrology consulted, was on hemodialysis but eventually he tolerated being off HD, tunneled catheter discontinued per nephrology.  Renal function is stable   Paroxysmal A-fib - Unable to  anticoagulation due to hematoma.  Rate controlled   Obstructive Sleep Apnea -Trach removed   Anxiety/Bipolar disorder - Continue with Paxil .     Severe Malnutrition - Continue tube feeding.  He is allowed dysphagia 1 per speech   History of Pathological fracture and multiple ORIF left Humerus 01/2023  -Dr. Jonel discussed with orthopedics on 6/26, options would involve either a substantial reconstruction with antibiotic cement and prolonged, difficult recovery, or above elbow amputation.  [name] recommends more rehabilitation at this time prior to attempting surgery. - Maintain left arm splint when out of bed - [name] following, since he is not clear when he will discharge likely to have surgery here in couple of weeks.  Date to be determined    Hypokalemia -Resolved/  Hyponatremia -stable.  Goals of care - Unfortunately poor long-term prognosis. Palliative care has discussed with wife previously.  Remain full code on full scope of care.  Awaiting SNF placement but he needs to be able to transfer better and be able to sit up in the chair for outpatient chemo which he is unable to do currently   See wound care documentation below.  Pressure Injury 07/05/23 Heel Left;Right Unstageable - Full thickness tissue loss in which the base of the injury is covered by slough (yellow, tan, gray, green or brown) and/or eschar (tan, brown or black) in the wound bed. (Active)  07/05/23 1108  Location: Heel  Location Orientation: Left;Right  Staging: Unstageable - Full thickness tissue loss in which the base of the injury is covered by slough (yellow, tan, gray, green or brown) and/or eschar (tan, brown or black) in the wound bed.  Wound Description (Comments):   DO NOT USE:  Present on Admission: Yes  Dressing Type Foam - Lift dressing to assess site every shift 09/26/23 1036     Pressure Injury 07/11/23 Sacrum Mid Unstageable - Full thickness tissue loss in which the base of the injury is covered by slough (yellow, tan, gray, green or brown) and/or eschar (tan, brown or black) in the wound bed. (Active)  07/11/23 1600  Location: Sacrum  Location Orientation: Mid  Staging: Unstageable - Full thickness tissue loss in which the base of the injury is covered by slough (yellow, tan, gray,  green or brown) and/or eschar (tan, brown or black) in the wound bed.  Wound Description (Comments):   DO NOT USE:  Present on Admission: No  Dressing Type Gauze (Comment);Santyl ;ABD;Barrier Film (skin prep) 09/26/23 1500     Pressure Injury 07/27/23 Buttocks Right;Lower (Active)  07/27/23 1700  Location: Buttocks  Location Orientation: Right;Lower  Staging:   Wound Description (Comments):   DO NOT USE:  Present on Admission: Yes  Dressing Type Foam - Lift  dressing to assess site every shift 09/26/23 1036     Pressure Injury 08/25/23 Buttocks Left Stage 2 -  Partial thickness loss of dermis presenting as a shallow open injury with a red, pink wound bed without slough. (Active)  08/25/23   Location: Buttocks  Location Orientation: Left  Staging: Stage 2 -  Partial thickness loss of dermis presenting as a shallow open injury with a red, pink wound bed without slough.  Wound Description (Comments):   DO NOT USE:  Present on Admission: No  Dressing Type Foam - Lift dressing to assess site every shift 09/25/23 2000     Nutrition Problem: Severe Malnutrition Etiology: acute illness (prolonged illness, interuptions in feeding)    Signs/Symptoms: moderate fat depletion, moderate muscle depletion, percent weight loss (9.6% x 1 month) Percent weight loss: 9.6 %    Interventions: Prostat, Tube feeding, MVI  Estimated body mass index is 30.16 kg/m as calculated from the following:   Height as of this encounter: 6' 0.01 (1.829 m).   Weight as of this encounter: 100.9 kg.   DVT prophylaxis: Lovenox .  Code Status: Full code Family Communication: Wife 7/10 Disposition Plan:  Status is: Inpatient Remains inpatient appropriate because: needs to be able sit up to be able to go SNF and out patient chemo    Consultants:  Oncology Nephrology  Orthopedic.    Procedures:  HD Chemotherapy  Antimicrobials:    Subjective: He is alert, feels well. Report pain right hip and numbness improved.   Objective: Vitals:   09/26/23 2345 09/27/23 0343 09/27/23 0717 09/27/23 0849  BP: 125/88 118/81 (!) 118/90   Pulse: 95 96 92 76  Resp: 18 18 19 20   Temp: 98.9 F (37.2 C) 98 F (36.7 C) 98.3 F (36.8 C)   TempSrc: Oral Oral    SpO2: 97% 95% 99% 95%  Weight:      Height:        Intake/Output Summary (Last 24 hours) at 09/27/2023 1128 Last data filed at 09/27/2023 1106 Gross per 24 hour  Intake 1440 ml  Output 6325 ml  Net -4885 ml    Filed Weights   09/24/23 0500 09/25/23 0500 09/26/23 0500  Weight: 97.4 kg 98.2 kg 100.9 kg    Examination:  General exam: NAD Respiratory system: CTA Cardiovascular system: S 1, S 2 RRR Gastrointestinal system: BS present, soft, nt Central nervous system: Alert, follows command.  Extremities: Trace edema    Data Reviewed: I have personally reviewed following labs and imaging studies  CBC: Recent Labs  Lab 09/23/23 0821 09/24/23 0806 09/25/23 1432 09/26/23 0447 09/27/23 0931  WBC 4.5 5.4 5.2 4.7 4.5  NEUTROABS 2.6 3.1 2.4 2.2 2.5  HGB 8.5* 8.8* 7.7* 8.0* 7.9*  HCT 25.9* 25.9* 23.4* 24.0* 24.0*  MCV 107.9* 107.9* 108.3* 107.1* 109.6*  PLT 275 254 284 261 262   Basic Metabolic Panel: Recent Labs  Lab 09/21/23 0330 09/23/23 0821 09/23/23 0822 09/24/23 0806 09/24/23 0809 09/25/23 1432 09/26/23 0447 09/27/23 0931  NA 129* 132* 133*  130* 130* 128* 129* 132*  K 4.1 4.0 4.1 4.1 4.1 4.6 4.3 4.5  CL 99 102 102 98 99 98 97* 101  CO2 22 21* 25 22 22  19* 22 22  GLUCOSE 129* 143* 141* 123* 120* 127* 88 133*  BUN 56* 43* 45* 41* 41* 42* 43* 42*  CREATININE 0.95 0.94 0.94 0.98 1.01 0.93 0.89 0.93  CALCIUM  8.4* 8.2* 8.3* 8.2* 8.3* 7.8* 8.1* 8.3*  MG  --  2.0  --  1.9  --   --   --   --   PHOS 4.1  --  4.2  --  4.6  --   --   --    GFR: Estimated Creatinine Clearance: 105.1 mL/min (by C-G formula based on SCr of 0.93 mg/dL). Liver Function Tests: Recent Labs  Lab 09/23/23 0821 09/23/23 0822 09/24/23 0806 09/24/23 0809 09/25/23 1432 09/26/23 0447 09/27/23 0931  AST 11*  --  12*  --  9* 11* 9*  ALT 8  --  7  --  7 7 6   ALKPHOS 93  --  84  --  79 76 71  BILITOT 0.4  --  0.4  --  0.4 0.4 0.4  PROT 5.2*  --  5.3*  --  5.2* 5.3* 5.3*  ALBUMIN  2.0*   < > 2.1* 2.1* 1.9* 1.9* 1.9*   < > = values in this interval not displayed.   No results for input(s): LIPASE, AMYLASE in the last 168 hours. No results for input(s): AMMONIA in the last 168  hours. Coagulation Profile: No results for input(s): INR, PROTIME in the last 168 hours. Cardiac Enzymes: No results for input(s): CKTOTAL, CKMB, CKMBINDEX, TROPONINI in the last 168 hours. BNP (last 3 results) No results for input(s): PROBNP in the last 8760 hours. HbA1C: No results for input(s): HGBA1C in the last 72 hours. CBG: Recent Labs  Lab 09/26/23 0619 09/26/23 1154 09/26/23 1602 09/26/23 2110 09/27/23 0613  GLUCAP 86 133* 105* 110* 79   Lipid Profile: No results for input(s): CHOL, HDL, LDLCALC, TRIG, CHOLHDL, LDLDIRECT in the last 72 hours. Thyroid Function Tests: No results for input(s): TSH, T4TOTAL, FREET4, T3FREE, THYROIDAB in the last 72 hours. Anemia Panel: No results for input(s): VITAMINB12, FOLATE, FERRITIN, TIBC, IRON, RETICCTPCT in the last 72 hours. Sepsis Labs: No results for input(s): PROCALCITON, LATICACIDVEN in the last 168 hours.  Recent Results (from the past 240 hours)  Aerobic/Anaerobic Culture w Gram Stain (surgical/deep wound)     Status: None (Preliminary result)   Collection Time: 09/26/23 10:41 AM   Specimen: Abscess  Result Value Ref Range Status   Specimen Description ABSCESS  Final   Special Requests NONE  Final   Gram Stain   Final    NO WBC SEEN NO ORGANISMS SEEN Performed at [facility], [address], [place], [place] [address]    Culture PENDING  Incomplete   Report Status PENDING  Incomplete         Radiology Studies: US  IMAGE GUIDED FLUID DRAIN BY CATHETER Result Date: 09/26/2023 CLINICAL DATA:  Large right gluteal fluid collection likely evolving hematoma or potential abscess EXAM: US  IMAGE GUIDED FLUID DRAIN BY CATHETER TECHNIQUE: Ultrasound guidance COMPARISON:  None Available. FINDINGS: With the patient in a supine position, the right gluteal region and hip region were evaluated with ultrasound. A large fluid collection corresponding with previous  visualized fluid collection is again seen. The patient was prepped and draped in usual sterile fashion. Local anesthesia  was achieved by infiltrating subcutaneous tissue with 1% lidocaine . A small incision was made and then a 7 cm Yueh needle was advanced under ultrasound guidance from the incision to midpoint of the fluid collection. The needle was removed and short Amplatz wire was advanced under ultrasound guidance and coiled within the fluid collection. The Yueh catheter was then removed and the access site was dilated with a 10 French fascial dilator. A 10 French pigtail catheter was then advanced over the guidewire and coiled within the fluid collection. Locking mechanism engaged. Retention suture and sterile dressing applied. Small sample was obtained and sent to pathology for aerobic and anaerobe evaluation. IMPRESSION: Satisfactory 10 French drainage catheter placed in a right hip abscess. Fluid appears to be infected hematoma. Electronically Signed   By: Cordella Banner   On: 09/26/2023 10:41   CT HEAD WO CONTRAST ( ) Result Date: 09/25/2023 CLINICAL DATA:  Initial evaluation for acute neuro deficit, stroke suspected. EXAM: CT HEAD WITHOUT CONTRAST TECHNIQUE: Contiguous axial images were obtained from the base of the skull through the vertex without intravenous contrast. RADIATION DOSE REDUCTION: This exam was performed according to the departmental dose-optimization program which includes automated exposure control, adjustment of the mA and/or kV according to patient size and/or use of iterative reconstruction technique. COMPARISON:  CT from 09/22/2023. FINDINGS: Brain: Cerebral volume within normal limits. Patchy hypodensity involving the supratentorial cerebral white matter, most characteristic of chronic microvascular ischemic disease, moderate in nature. No acute intracranial hemorrhage. No acute large vessel territory infarct. No mass lesion, midline shift or mass effect. No hydrocephalus or  extra-axial fluid collection. Vascular: No abnormal hyperdense vessel. Calcified atherosclerosis present at the skull base. Skull: Scalp soft tissues demonstrate no acute finding. Multiple lucent lesions again noted throughout the calvarium and visualized skull base, consistent with history of multiple myeloma. Sinuses/Orbits: Globes orbital soft tissues within normal limits. Mild mucosal thickening noted about the right maxillary sinus. Minimal pneumatized secretions noted within the right sphenoid sinus. Large right mastoid and middle ear effusion, with small left mastoid effusion. Imaged nasopharynx grossly unremarkable. Other: None. IMPRESSION: 1. No acute intracranial abnormality. 2. Moderate chronic microvascular ischemic disease. 3. Multiple lucent lesions throughout the calvarium and visualized skull base, consistent with history of multiple myeloma. 4. Large right mastoid and middle ear effusion, with small left mastoid effusion. Findings of uncertain significance, however, correlation with physical exam for possible acute otomastoiditis recommended. Electronically Signed   By: Morene Hoard M.D.   On: 09/25/2023 20:46         Scheduled Meds:  acyclovir   400 mg Oral BID   enoxaparin  (LOVENOX ) injection  50 mg Subcutaneous Daily   feeding supplement  237 mL Oral BID WC   feeding supplement (PROSource TF20)  60 mL Per Tube BID   free water   50 mL Per Tube Daily   insulin  aspart  0-15 Units Subcutaneous TID AC & HS   insulin  aspart  2 Units Subcutaneous TID AC & HS   insulin  glargine-yfgn  10 Units Subcutaneous Q24H   montelukast   5 mg Oral QHS   multivitamin with minerals  1 tablet Oral Daily   nutrition supplement (JUVEN)  1 packet Oral BID BM   mouth rinse  15 mL Mouth Rinse 4 times per day   PARoxetine   40 mg Oral Daily   polyethylene glycol  17 g Oral BID   senna  1 tablet Oral Daily   sodium chloride  flush  10-40 mL Intracatheter Q12H   sodium hypochlorite  Irrigation  Daily   umeclidinium-vilanterol  1 puff Inhalation Daily   valproic  acid  750 mg Oral BID   voriconazole   400 mg Oral Q12H   Continuous Infusions:  sodium chloride  Stopped (08/28/23 1208)   sodium chloride  Stopped (08/28/23 9160)   cefTRIAXone  (ROCEPHIN )  IV 2 g (09/27/23 0913)   metronidazole  500 mg (09/26/23 2335)   vancomycin  1,500 mg (09/27/23 0220)     LOS: 86 days    Time spent: 35 minutes    Jeric [name] A Casmira Cramer, MD [facility]   If 7PM-7AM, please contact night-coverage [email or web address]  09/27/2023, 11:28 AM

## 2023-09-27 NOTE — Plan of Care (Signed)
  Problem: Education: Goal: Knowledge of General Education information will improve Description: Including pain rating scale, medication(s)/side effects and non-pharmacologic comfort measures Outcome: Progressing   Problem: Health Behavior/Discharge Planning: Goal: Ability to manage health-related needs will improve Outcome: Progressing   Problem: Clinical Measurements: Goal: Ability to maintain clinical measurements within normal limits will improve Outcome: Progressing Goal: Will remain free from infection Outcome: Progressing Goal: Diagnostic test results will improve Outcome: Progressing Goal: Respiratory complications will improve Outcome: Progressing Goal: Cardiovascular complication will be avoided Outcome: Progressing   Problem: Activity: Goal: Risk for activity intolerance will decrease Outcome: Progressing   Problem: Nutrition: Goal: Adequate nutrition will be maintained Outcome: Progressing   Problem: Coping: Goal: Level of anxiety will decrease Outcome: Progressing   Problem: Elimination: Goal: Will not experience complications related to bowel motility Outcome: Progressing Goal: Will not experience complications related to urinary retention Outcome: Progressing   Problem: Pain Managment: Goal: General experience of comfort will improve and/or be controlled Outcome: Progressing   Problem: Safety: Goal: Ability to remain free from injury will improve Outcome: Progressing   Problem: Skin Integrity: Goal: Risk for impaired skin integrity will decrease Outcome: Progressing   Problem: Education: Goal: Ability to describe self-care measures that may prevent or decrease complications (Diabetes Survival Skills Education) will improve Outcome: Progressing Goal: Individualized Educational Video(s) Outcome: Progressing   Problem: Coping: Goal: Ability to adjust to condition or change in health will improve Outcome: Progressing   Problem: Fluid  Volume: Goal: Ability to maintain a balanced intake and output will improve Outcome: Progressing   Problem: Health Behavior/Discharge Planning: Goal: Ability to identify and utilize available resources and services will improve Outcome: Progressing Goal: Ability to manage health-related needs will improve Outcome: Progressing   Problem: Metabolic: Goal: Ability to maintain appropriate glucose levels will improve Outcome: Progressing   Problem: Nutritional: Goal: Maintenance of adequate nutrition will improve Outcome: Progressing Goal: Progress toward achieving an optimal weight will improve Outcome: Progressing   Problem: Skin Integrity: Goal: Risk for impaired skin integrity will decrease Outcome: Progressing   Problem: Tissue Perfusion: Goal: Adequacy of tissue perfusion will improve Outcome: Progressing   Problem: Education: Goal: Knowledge about tracheostomy care/management will improve Outcome: Progressing   Problem: Activity: Goal: Ability to tolerate increased activity will improve Outcome: Progressing   Problem: Health Behavior/Discharge Planning: Goal: Ability to manage tracheostomy will improve Outcome: Progressing   Problem: Respiratory: Goal: Patent airway maintenance will improve Outcome: Progressing   Problem: Role Relationship: Goal: Ability to communicate will improve Outcome: Progressing   Problem: Education: Goal: Knowledge of disease and its progression will improve Outcome: Progressing Goal: Individualized Educational Video(s) Outcome: Progressing   Problem: Fluid Volume: Goal: Compliance with measures to maintain balanced fluid volume will improve Outcome: Progressing   Problem: Health Behavior/Discharge Planning: Goal: Ability to manage health-related needs will improve Outcome: Progressing   Problem: Nutritional: Goal: Ability to make healthy dietary choices will improve Outcome: Progressing   Problem: Clinical  Measurements: Goal: Complications related to the disease process, condition or treatment will be avoided or minimized Outcome: Progressing

## 2023-09-28 ENCOUNTER — Inpatient Hospital Stay (HOSPITAL_COMMUNITY)

## 2023-09-28 DIAGNOSIS — L89313 Pressure ulcer of right buttock, stage 3: Secondary | ICD-10-CM | POA: Diagnosis not present

## 2023-09-28 DIAGNOSIS — B957 Other staphylococcus as the cause of diseases classified elsewhere: Secondary | ICD-10-CM | POA: Diagnosis not present

## 2023-09-28 DIAGNOSIS — C9000 Multiple myeloma not having achieved remission: Secondary | ICD-10-CM | POA: Diagnosis not present

## 2023-09-28 DIAGNOSIS — L02415 Cutaneous abscess of right lower limb: Secondary | ICD-10-CM | POA: Diagnosis not present

## 2023-09-28 DIAGNOSIS — S7001XA Contusion of right hip, initial encounter: Secondary | ICD-10-CM | POA: Diagnosis not present

## 2023-09-28 DIAGNOSIS — A411 Sepsis due to other specified staphylococcus: Secondary | ICD-10-CM | POA: Diagnosis not present

## 2023-09-28 DIAGNOSIS — R7881 Bacteremia: Secondary | ICD-10-CM | POA: Diagnosis not present

## 2023-09-28 DIAGNOSIS — Z515 Encounter for palliative care: Secondary | ICD-10-CM | POA: Diagnosis not present

## 2023-09-28 DIAGNOSIS — A419 Sepsis, unspecified organism: Secondary | ICD-10-CM | POA: Diagnosis not present

## 2023-09-28 DIAGNOSIS — R6521 Severe sepsis with septic shock: Secondary | ICD-10-CM | POA: Diagnosis not present

## 2023-09-28 LAB — CBC WITH DIFFERENTIAL/PLATELET
Abs Immature Granulocytes: 0.01 K/uL (ref 0.00–0.07)
Basophils Absolute: 0 K/uL (ref 0.0–0.1)
Basophils Relative: 1 %
Eosinophils Absolute: 0.1 K/uL (ref 0.0–0.5)
Eosinophils Relative: 2 %
HCT: 25.6 % — ABNORMAL LOW (ref 39.0–52.0)
Hemoglobin: 8.3 g/dL — ABNORMAL LOW (ref 13.0–17.0)
Immature Granulocytes: 0 %
Lymphocytes Relative: 26 %
Lymphs Abs: 1.1 K/uL (ref 0.7–4.0)
MCH: 35 pg — ABNORMAL HIGH (ref 26.0–34.0)
MCHC: 32.4 g/dL (ref 30.0–36.0)
MCV: 108 fL — ABNORMAL HIGH (ref 80.0–100.0)
Monocytes Absolute: 0.9 K/uL (ref 0.1–1.0)
Monocytes Relative: 21 %
Neutro Abs: 2.2 K/uL (ref 1.7–7.7)
Neutrophils Relative %: 50 %
Platelets: 293 K/uL (ref 150–400)
RBC: 2.37 MIL/uL — ABNORMAL LOW (ref 4.22–5.81)
RDW: 19.9 % — ABNORMAL HIGH (ref 11.5–15.5)
WBC: 4.3 K/uL (ref 4.0–10.5)
nRBC: 0 % (ref 0.0–0.2)

## 2023-09-28 LAB — GLUCOSE, CAPILLARY
Glucose-Capillary: 86 mg/dL (ref 70–99)
Glucose-Capillary: 95 mg/dL (ref 70–99)
Glucose-Capillary: 149 mg/dL — ABNORMAL HIGH (ref 70–99)
Glucose-Capillary: 137 mg/dL — ABNORMAL HIGH (ref 70–99)

## 2023-09-28 MED ORDER — GUAIFENESIN ER 600 MG PO TB12
600.0000 mg | ORAL_TABLET | Freq: Two times a day (BID) | ORAL | Status: DC
  Administered 2023-09-28 – 2023-11-07 (×86): 600 mg via ORAL
  Filled 2023-09-28 (×80): qty 1

## 2023-09-28 MED ORDER — LIDOCAINE 5 % EX PTCH
1.0000 | MEDICATED_PATCH | CUTANEOUS | Status: DC
  Administered 2023-09-28 – 2023-11-01 (×12): 1 via TRANSDERMAL
  Filled 2023-09-28 (×28): qty 1

## 2023-09-28 MED ORDER — BENZONATATE 100 MG PO CAPS
100.0000 mg | ORAL_CAPSULE | Freq: Three times a day (TID) | ORAL | Status: DC | PRN
  Administered 2023-09-28: 100 mg via ORAL
  Filled 2023-09-28: qty 1

## 2023-09-28 NOTE — Plan of Care (Signed)
  Problem: Education: Goal: Knowledge of General Education information will improve Description: Including pain rating scale, medication(s)/side effects and non-pharmacologic comfort measures Outcome: Progressing   Problem: Health Behavior/Discharge Planning: Goal: Ability to manage health-related needs will improve Outcome: Progressing   Problem: Clinical Measurements: Goal: Ability to maintain clinical measurements within normal limits will improve Outcome: Progressing Goal: Will remain free from infection Outcome: Progressing Goal: Diagnostic test results will improve Outcome: Progressing Goal: Respiratory complications will improve Outcome: Progressing Goal: Cardiovascular complication will be avoided Outcome: Progressing   Problem: Activity: Goal: Risk for activity intolerance will decrease Outcome: Progressing   Problem: Nutrition: Goal: Adequate nutrition will be maintained Outcome: Progressing   Problem: Coping: Goal: Level of anxiety will decrease Outcome: Progressing   Problem: Elimination: Goal: Will not experience complications related to bowel motility Outcome: Progressing Goal: Will not experience complications related to urinary retention Outcome: Progressing   Problem: Pain Managment: Goal: General experience of comfort will improve and/or be controlled Outcome: Progressing   Problem: Safety: Goal: Ability to remain free from injury will improve Outcome: Progressing   Problem: Skin Integrity: Goal: Risk for impaired skin integrity will decrease Outcome: Progressing   Problem: Education: Goal: Ability to describe self-care measures that may prevent or decrease complications (Diabetes Survival Skills Education) will improve Outcome: Progressing Goal: Individualized Educational Video(s) Outcome: Progressing   Problem: Coping: Goal: Ability to adjust to condition or change in health will improve Outcome: Progressing   Problem: Fluid  Volume: Goal: Ability to maintain a balanced intake and output will improve Outcome: Progressing   Problem: Health Behavior/Discharge Planning: Goal: Ability to identify and utilize available resources and services will improve Outcome: Progressing Goal: Ability to manage health-related needs will improve Outcome: Progressing   Problem: Metabolic: Goal: Ability to maintain appropriate glucose levels will improve Outcome: Progressing   Problem: Nutritional: Goal: Maintenance of adequate nutrition will improve Outcome: Progressing Goal: Progress toward achieving an optimal weight will improve Outcome: Progressing   Problem: Skin Integrity: Goal: Risk for impaired skin integrity will decrease Outcome: Progressing   Problem: Tissue Perfusion: Goal: Adequacy of tissue perfusion will improve Outcome: Progressing   Problem: Education: Goal: Knowledge about tracheostomy care/management will improve Outcome: Progressing   Problem: Activity: Goal: Ability to tolerate increased activity will improve Outcome: Progressing   Problem: Health Behavior/Discharge Planning: Goal: Ability to manage tracheostomy will improve Outcome: Progressing   Problem: Respiratory: Goal: Patent airway maintenance will improve Outcome: Progressing   Problem: Role Relationship: Goal: Ability to communicate will improve Outcome: Progressing   Problem: Education: Goal: Knowledge of disease and its progression will improve Outcome: Progressing Goal: Individualized Educational Video(s) Outcome: Progressing   Problem: Fluid Volume: Goal: Compliance with measures to maintain balanced fluid volume will improve Outcome: Progressing   Problem: Health Behavior/Discharge Planning: Goal: Ability to manage health-related needs will improve Outcome: Progressing   Problem: Nutritional: Goal: Ability to make healthy dietary choices will improve Outcome: Progressing   Problem: Clinical  Measurements: Goal: Complications related to the disease process, condition or treatment will be avoided or minimized Outcome: Progressing

## 2023-09-28 NOTE — Progress Notes (Addendum)
 PROGRESS NOTE    Frank [name].  FMW:[id number] DOB: 09-27-1965 DOA: 07/03/2023 PCP: [name], MD   Brief Narrative: 58 year old PMH OSA CPAP, pathological fracture and multiple ORIF left Humerus likely due to multiple myeloma, A-fib not on anticoagulation, hyperlipidemia, tobacco use disorder who was feeling weak for 1 week, admitted to [facility] with concern of sepsis, community-acquired pneumonia and acute encephalopathy. Patient was found to have lytic lesions, he was transferred to [facility] for oncology treatment. In the meantime he developed encephalopathy, acute renal failure. He remained in the ICU for 36 days. Underwent tracheostomy. He was also started on HD after CRRT. Subsequently PEG tube placed 5/29.   Significant events 4/17-Admitted to medical floor with oncology consultation to treat multiple myeloma. 4/19-transferred to ICU intubated, encephalopathic. 4/20-A-fib with RVR. Started heparin . Groin and biopsy site hematoma requiring multiple transfusions. EEG 4/25 >> moderate to severe encephalopathy without any seizures or epileptiform discharges. 4/29 Received Velcade  and Cytoxan  chemotherapy-intent palliative? 5/2 LP and ETT exchange  5/8 Trach placed. Some post op bleeding improved with thrombi-pad 5/10 Voriconazole  added back based on CT Chest findings 5/13 more alert and able to follow some commands. 5/23, transferred to floor. 5/29-PEG placed 5/31 chemotherapy changed to Sarclisa /Kyprolis /Cytoxan . 6/13 plan to remove HD cathter for holiday line 6/16 new HD catheter placed 6/23 has not required HD for a week, HD catheter removed 6/28 finished first cycle of chemo, next cycle to start in 2 weeks 7/11: underwent IR drain placement of right thigh hip infected hematoma hematoma.   Assessment & Plan:   Principal Problem:   Severe sepsis with septic shock (HCC) Active Problems:   Aspergillosis, unspecified (HCC)   Multiple myeloma not having achieved  remission (HCC)   Lobar pneumonia, unspecified organism (HCC)   Closed fracture of left distal humerus   AKI (acute kidney injury) (HCC)   Acute hypoxemic respiratory failure (HCC)   PAF (paroxysmal atrial fibrillation) (HCC)   Acute metabolic encephalopathy   Protein-calorie malnutrition, severe   Hematoma of right thigh   Seizure disorder (HCC)   DMII (diabetes mellitus, type 2) (HCC)   Pressure injury of skin   CAD (coronary artery disease)   Chronic diastolic CHF (congestive heart failure) (HCC)   CKD (chronic kidney disease), stage IIIa (HCC)   Class 1 obesity   Asthma   Bipolar disorder (HCC)   Febrile neutropenia (HCC)   Generalized weakness   Anemia  Recurrent fevers, sepsis, Staph Epi Bacteremia - -Hospital course complicated by intermittent fevers, ID consulted and most recently evaluated patient 6/17.   -He had a fever and MRSE bacteremia 1/4 bottles, completed a course of vancomycin  with the end date 6/20.  He did receive a line holiday.  Repeat blood culture 6/9 without growth. - He has unstageable sacral ulcer, continue local wound care.      Right Thigh Hematoma -Infected.   per CT on 08/01/23: 12.1 x 30.2 x 7.6 cm subcutaneous hematoma within the anterior right thigh. 8.4 x 10.4 x 22.2 cm subcutaneous hematoma within the right gluteal region - continue supportive care.  Hemoglobin stable Ortho consulted, repeat CT hip showed persistent large fluid collection. Otho consulted, recommend drain placement by IR>  Underwent IR drain placement, for infected Hematoma.  Started  IV Vancomycin , flagyl  and Ceftriaxone . Follow cultures.  -ID consulted.   Right leg numbness : Improved after hip hematoma drained.  -CT scan without acute findings. MRI order. He can't tolerate MRI without sedation. Will hold for  now. He was recently intubated and required trach this hospitalization.  -Hip CT: showed large hematoma gluteus medius and rectus femoris muscle  -Repeated CT head  negative.  Multiple myeloma -oncology following. Started on Velcade  and Cytoxan  by Oncology, transitioned to Kyprolis  then Sarclisa .   - Sarclisa  per Oncology - Next dose chemo due ~7/14---if clear by Dr [name] in setting of hematoma infection.    Left side pain, ribs worse with cough; Suspect MSK pain.  EKG sinus rhythm.  Check Chest x ray.   Aspergillosis with pneumonia -On Voriconazole  per ID, On Acyclovir .  End date for voriconazole  is 8/5, continue.  Acute Hypoxic Respiratory Failure -  trach removed 6/11.  Respiratory status stable.   Severe acute metabolic encephalopathy with hypoactive delirium, seizure disorder - Normal MRI of the brain.  LP negative for meningitis or encephalitis.  EEG negative for seizures but encephalopathy.  -Home Depakote  discontinued in ICU. -Improved.   Insulin -dependent diabetes type 2 with hyperglycemia- A1c 6.0, continue with sliding scale insulin .  Hypoglycemia now resolved   AKI on CKD stage IIIa, Uremia,  Hyponatremia, Hypokalemia -Due to MM, hypercalcemia and ACE inhibitors?  Nephrology consulted, was on hemodialysis but eventually he tolerated being off HD, tunneled catheter discontinued per nephrology.  Renal function is stable   Paroxysmal A-fib - Unable to  anticoagulation due to hematoma.  Rate controlled   Obstructive Sleep Apnea -Trach removed   Anxiety/Bipolar disorder - Continue with Paxil .     Severe Malnutrition - Continue tube feeding.  He is allowed dysphagia 1 per speech   History of Pathological fracture and multiple ORIF left Humerus 01/2023  -Dr. Jonel discussed with orthopedics on 6/26, options would involve either a substantial reconstruction with antibiotic cement and prolonged, difficult recovery, or above elbow amputation.  [name] recommends more rehabilitation at this time prior to attempting surgery. - Maintain left arm splint when out of bed - [name] following, since he is not clear when he will discharge  likely to have surgery here in couple of weeks.  Date to be determined   Hypokalemia -Resolved/  Hyponatremia -stable.    Goals of care - Unfortunately poor long-term prognosis. Palliative care has discussed with wife previously.  Remain full code on full scope of care.  Awaiting SNF placement but he needs to be able to transfer better and be able to sit up in the chair for outpatient chemo which he is unable to do currently   See wound care documentation below.  Pressure Injury 07/05/23 Heel Left;Right Unstageable - Full thickness tissue loss in which the base of the injury is covered by slough (yellow, tan, gray, green or brown) and/or eschar (tan, brown or black) in the wound bed. (Active)  07/05/23 1108  Location: Heel  Location Orientation: Left;Right  Staging: Unstageable - Full thickness tissue loss in which the base of the injury is covered by slough (yellow, tan, gray, green or brown) and/or eschar (tan, brown or black) in the wound bed.  Wound Description (Comments):   DO NOT USE:  Present on Admission: Yes  Dressing Type Foam - Lift dressing to assess site every shift 09/27/23 0715     Pressure Injury 07/11/23 Sacrum Mid Unstageable - Full thickness tissue loss in which the base of the injury is covered by slough (yellow, tan, gray, green or brown) and/or eschar (tan, brown or black) in the wound bed. (Active)  07/11/23 1600  Location: Sacrum  Location Orientation: Mid  Staging: Unstageable - Full thickness  tissue loss in which the base of the injury is covered by slough (yellow, tan, gray, green or brown) and/or eschar (tan, brown or black) in the wound bed.  Wound Description (Comments):   DO NOT USE:  Present on Admission: No  Dressing Type Gauze (Comment) 09/27/23 2000     Pressure Injury 07/27/23 Buttocks Right;Lower (Active)  07/27/23 1700  Location: Buttocks  Location Orientation: Right;Lower  Staging:   Wound Description (Comments):   DO NOT USE:  Present on  Admission: Yes  Dressing Type Gauze (Comment) 09/27/23 2000     Pressure Injury 08/25/23 Buttocks Left Stage 2 -  Partial thickness loss of dermis presenting as a shallow open injury with a red, pink wound bed without slough. (Active)  08/25/23   Location: Buttocks  Location Orientation: Left  Staging: Stage 2 -  Partial thickness loss of dermis presenting as a shallow open injury with a red, pink wound bed without slough.  Wound Description (Comments):   DO NOT USE:  Present on Admission: No  Dressing Type Foam - Lift dressing to assess site every shift 09/27/23 0715     Nutrition Problem: Severe Malnutrition Etiology: acute illness (prolonged illness, interuptions in feeding)    Signs/Symptoms: moderate fat depletion, moderate muscle depletion, percent weight loss (9.6% x 1 month) Percent weight loss: 9.6 %    Interventions: Prostat, Tube feeding, MVI  Estimated body mass index is 30.16 kg/m as calculated from the following:   Height as of this encounter: 6' 0.01 (1.829 m).   Weight as of this encounter: 100.9 kg.   DVT prophylaxis: Lovenox .  Code Status: Full code Family Communication: Wife 7/10 Disposition Plan:  Status is: Inpatient Remains inpatient appropriate because: needs to be able sit up to be able to go SNF and out patient chemo    Consultants:  Oncology Nephrology  Orthopedic.    Procedures:  HD Chemotherapy  Antimicrobials:    Subjective: Report pain left side ribs, specially with movement of cough.   Objective: Vitals:   09/27/23 2344 09/28/23 0708 09/28/23 0815 09/28/23 1139  BP: 122/86 (!) 135/93  (!) 144/89  Pulse: 97 91 96 95  Resp: 17 16 20 20   Temp: 98.7 F (37.1 C) 98 F (36.7 C)  98 F (36.7 C)  TempSrc: Axillary Oral  Oral  SpO2: 96% 99% 95% 95%  Weight:      Height:        Intake/Output Summary (Last 24 hours) at 09/28/2023 1502 Last data filed at 09/28/2023 1300 Gross per 24 hour  Intake 740 ml  Output 4670 ml  Net  -3930 ml   Filed Weights   09/24/23 0500 09/25/23 0500 09/26/23 0500  Weight: 97.4 kg 98.2 kg 100.9 kg    Examination:  General exam: NAD Respiratory system: CTA Cardiovascular system: S 1, S2  RRR Gastrointestinal system: BS present, soft nt Central nervous system: alert Extremities: Trace edema Drain right hip, with bloody fluid content.    Data Reviewed: I have personally reviewed following labs and imaging studies  CBC: Recent Labs  Lab 09/23/23 0821 09/24/23 0806 09/25/23 1432 09/26/23 0447 09/27/23 0931  WBC 4.5 5.4 5.2 4.7 4.5  NEUTROABS 2.6 3.1 2.4 2.2 2.5  HGB 8.5* 8.8* 7.7* 8.0* 7.9*  HCT 25.9* 25.9* 23.4* 24.0* 24.0*  MCV 107.9* 107.9* 108.3* 107.1* 109.6*  PLT 275 254 284 261 262   Basic Metabolic Panel: Recent Labs  Lab 09/23/23 0821 09/23/23 9177 09/24/23 0806 09/24/23 0809 09/25/23 1432 09/26/23 0447  09/27/23 0931  NA 132* 133* 130* 130* 128* 129* 132*  K 4.0 4.1 4.1 4.1 4.6 4.3 4.5  CL 102 102 98 99 98 97* 101  CO2 21* 25 22 22  19* 22 22  GLUCOSE 143* 141* 123* 120* 127* 88 133*  BUN 43* 45* 41* 41* 42* 43* 42*  CREATININE 0.94 0.94 0.98 1.01 0.93 0.89 0.93  CALCIUM  8.2* 8.3* 8.2* 8.3* 7.8* 8.1* 8.3*  MG 2.0  --  1.9  --   --   --   --   PHOS  --  4.2  --  4.6  --   --   --    GFR: Estimated Creatinine Clearance: 105.1 mL/min (by C-G formula based on SCr of 0.93 mg/dL). Liver Function Tests: Recent Labs  Lab 09/23/23 0821 09/23/23 0822 09/24/23 0806 09/24/23 0809 09/25/23 1432 09/26/23 0447 09/27/23 0931  AST 11*  --  12*  --  9* 11* 9*  ALT 8  --  7  --  7 7 6   ALKPHOS 93  --  84  --  79 76 71  BILITOT 0.4  --  0.4  --  0.4 0.4 0.4  PROT 5.2*  --  5.3*  --  5.2* 5.3* 5.3*  ALBUMIN  2.0*   < > 2.1* 2.1* 1.9* 1.9* 1.9*   < > = values in this interval not displayed.   No results for input(s): LIPASE, AMYLASE in the last 168 hours. No results for input(s): AMMONIA in the last 168 hours. Coagulation Profile: No results  for input(s): INR, PROTIME in the last 168 hours. Cardiac Enzymes: No results for input(s): CKTOTAL, CKMB, CKMBINDEX, TROPONINI in the last 168 hours. BNP (last 3 results) No results for input(s): PROBNP in the last 8760 hours. HbA1C: No results for input(s): HGBA1C in the last 72 hours. CBG: Recent Labs  Lab 09/27/23 1247 09/27/23 1651 09/27/23 2117 09/28/23 0620 09/28/23 1140  GLUCAP 98 134* 151* 86 95   Lipid Profile: No results for input(s): CHOL, HDL, LDLCALC, TRIG, CHOLHDL, LDLDIRECT in the last 72 hours. Thyroid Function Tests: No results for input(s): TSH, T4TOTAL, FREET4, T3FREE, THYROIDAB in the last 72 hours. Anemia Panel: No results for input(s): VITAMINB12, FOLATE, FERRITIN, TIBC, IRON, RETICCTPCT in the last 72 hours. Sepsis Labs: No results for input(s): PROCALCITON, LATICACIDVEN in the last 168 hours.  Recent Results (from the past 240 hours)  Aerobic/Anaerobic Culture w Gram Stain (surgical/deep wound)     Status: None (Preliminary result)   Collection Time: 09/26/23 10:41 AM   Specimen: Abscess  Result Value Ref Range Status   Specimen Description ABSCESS  Final   Special Requests NONE  Final   Gram Stain NO WBC SEEN NO ORGANISMS SEEN   Final   Culture   Final    NO GROWTH 2 DAYS Performed at [facility], [address] [place], [place] [address]    Report Status PENDING  Incomplete         Radiology Studies: No results found.        Scheduled Meds:  acyclovir   400 mg Oral BID   enoxaparin  (LOVENOX ) injection  50 mg Subcutaneous Daily   feeding supplement  237 mL Oral BID WC   feeding supplement (PROSource TF20)  60 mL Per Tube BID   ferrous sulfate   325 mg Oral Q breakfast   free water   50 mL Per Tube Daily   guaiFENesin   600 mg Oral BID   insulin  aspart  0-15  Units Subcutaneous TID AC & HS   insulin  aspart  2 Units Subcutaneous TID AC & HS   insulin  glargine-yfgn  10  Units Subcutaneous Q24H   montelukast   5 mg Oral QHS   multivitamin with minerals  1 tablet Oral Daily   nutrition supplement (JUVEN)  1 packet Oral BID BM   mouth rinse  15 mL Mouth Rinse 4 times per day   pantoprazole   40 mg Oral BID   PARoxetine   40 mg Oral Daily   polyethylene glycol  17 g Oral BID   senna  1 tablet Oral Daily   sodium chloride  flush  10-40 mL Intracatheter Q12H   sodium hypochlorite   Irrigation Daily   umeclidinium-vilanterol  1 puff Inhalation Daily   valproic  acid  750 mg Oral BID   voriconazole   400 mg Oral Q12H   Continuous Infusions:  sodium chloride  Stopped (08/28/23 1208)   sodium chloride  Stopped (08/28/23 0839)   cefTRIAXone  (ROCEPHIN )  IV 2 g (09/28/23 0902)   metronidazole  500 mg (09/28/23 1219)   vancomycin  1,500 mg (09/28/23 1227)     LOS: 87 days    Time spent: 35 minutes    Steffany Schoenfelder A Rodina Pinales, MD [facility]   If 7PM-7AM, please contact night-coverage [email or web address]  09/28/2023, 3:02 PM

## 2023-09-29 ENCOUNTER — Inpatient Hospital Stay (HOSPITAL_COMMUNITY)

## 2023-09-29 DIAGNOSIS — R6521 Severe sepsis with septic shock: Secondary | ICD-10-CM | POA: Diagnosis not present

## 2023-09-29 DIAGNOSIS — A419 Sepsis, unspecified organism: Secondary | ICD-10-CM | POA: Diagnosis not present

## 2023-09-29 LAB — GLUCOSE, CAPILLARY
Glucose-Capillary: 84 mg/dL (ref 70–99)
Glucose-Capillary: 116 mg/dL — ABNORMAL HIGH (ref 70–99)
Glucose-Capillary: 150 mg/dL — ABNORMAL HIGH (ref 70–99)
Glucose-Capillary: 115 mg/dL — ABNORMAL HIGH (ref 70–99)

## 2023-09-29 LAB — BASIC METABOLIC PANEL WITH GFR
Anion gap: 10 (ref 5–15)
BUN: 46 mg/dL — ABNORMAL HIGH (ref 6–20)
CO2: 22 mmol/L (ref 22–32)
Calcium: 8.6 mg/dL — ABNORMAL LOW (ref 8.9–10.3)
Chloride: 98 mmol/L (ref 98–111)
Creatinine, Ser: 0.94 mg/dL (ref 0.61–1.24)
GFR, Estimated: >60 mL/min (ref >60)
Glucose, Bld: 134 mg/dL — ABNORMAL HIGH (ref 70–99)
Potassium: 4.3 mmol/L (ref 3.5–5.1)
Sodium: 130 mmol/L — ABNORMAL LOW (ref 135–145)

## 2023-09-29 LAB — CBC
HCT: 25.9 % — ABNORMAL LOW (ref 39.0–52.0)
Hemoglobin: 8.4 g/dL — ABNORMAL LOW (ref 13.0–17.0)
MCH: 34.9 pg — ABNORMAL HIGH (ref 26.0–34.0)
MCHC: 32.4 g/dL (ref 30.0–36.0)
MCV: 107.5 fL — ABNORMAL HIGH (ref 80.0–100.0)
Platelets: 298 K/uL (ref 150–400)
RBC: 2.41 MIL/uL — ABNORMAL LOW (ref 4.22–5.81)
RDW: 19.9 % — ABNORMAL HIGH (ref 11.5–15.5)
WBC: 3.9 K/uL — ABNORMAL LOW (ref 4.0–10.5)
nRBC: 0 % (ref 0.0–0.2)

## 2023-09-29 MED ORDER — AMOXICILLIN-POT CLAVULANATE 875-125 MG PO TABS
1.0000 | ORAL_TABLET | Freq: Two times a day (BID) | ORAL | Status: AC
  Administered 2023-09-29 – 2023-10-09 (×22): 1 via ORAL
  Filled 2023-09-29 (×22): qty 1

## 2023-09-29 NOTE — TOC Progression Note (Addendum)
 Transition of Care (TOC) - Progression Note    Patient Details  Name: [name]. MRN: [id number] Date of Birth: 10-21-1965  Transition of [facility]) CM/SW Contact  Isaiah Public, LCSWA Phone Number: 09/29/2023, 3:17 PM  Clinical Narrative:     CSW continues to follow for possible SNF placement when able.  Expected Discharge Plan: Skilled Nursing Facility Barriers to Discharge: Continued Medical Work up  Expected Discharge Plan and Services In-house Referral: Clinical Social Work Discharge Planning Services: CM Consult Post Acute Care Choice: Skilled Nursing Facility Living arrangements for the past 2 months: Single Family Home                 DME Arranged: N/A DME Agency: NA       HH Arranged: NA HH Agency: NA         Social Determinants of Health (SDOH) Interventions SDOH Screenings   Food Insecurity: No Food Insecurity (07/05/2023)  Housing: Low Risk  (07/05/2023)  Transportation Needs: No Transportation Needs (07/05/2023)  Utilities: Not At Risk (07/05/2023)  Tobacco Use: High Risk (08/30/2023)    Readmission Risk Interventions     No data to display

## 2023-09-29 NOTE — Progress Notes (Signed)
 Transferred to 2 west report given to [employer]

## 2023-09-29 NOTE — Progress Notes (Signed)
 Speech Language Pathology Treatment: Dysphagia  Patient Details Name: Frank [name]. MRN: [id number] DOB: [birth year] Today's Date: 09/29/2023 Time: 8759-8679 SLP Time Calculation (min) (ACUTE ONLY): 40 min  Assessment / Plan / Recommendation Clinical Impression  Pt seen with meal, able to feed himself after set up. Mentation much improved and pt independent with mastication. No coughing observed throughout. Suspect dysphagia mostly resolved by this point. Pt does want his food cut up, but reports he would still masticate bite sized pieces. A mechanical soft diet would given him a greater variety of foods; he particularly wants pancakes and was disappointed he couldn't have them on dys 2 diet. Will upgrade to mech soft and f/u with pt about his preference after trying a few meals and likely sign off.   HPI HPI: 58 y.o. male transferred from [facility] 07/03/23 to [facility] for management of newly diagnosed plasma cell myeloma with weakness and AMS. Pt also with AKI and metabolic encephalopathy. 5/10 transfer to [facility] for iHD.Bedside swallow eval 4/18 with recs for NPO due to somnolence. 4/22 Intubated and chemo initiated. 4/23-5/10 CRRT. 5/6 IVIG started. 5/8 trach. 5/12 iHD initiated. 5/18 inability to oxygenate through trach, intubated and trach revision. Subsequently decannulated. PEG. PMHx:Lt THA, carpal tunnel syndrome, Lt TKA, Lt humerus fx, PAF, HTN, HLD, bipolar disorder, obesity, T2DM.      SLP Plan  Continue with current plan of care          Recommendations  Diet recommendations: Dysphagia 3 (mechanical soft);Thin liquid Liquids provided via: Cup;Straw Medication Administration: Via alternative means Supervision: Patient able to self feed;Full supervision/cueing for compensatory strategies Compensations: Slow rate;Small sips/bites;Follow solids with liquid Postural Changes and/or Swallow Maneuvers: Seated upright 90 degrees                  Oral care BID     Dysphagia,  oropharyngeal phase (R13.12)     Continue with current plan of care     Danton Palmateer, Consuelo Fitch  09/29/2023, 1:49 PM

## 2023-09-29 NOTE — Progress Notes (Signed)
 [facility]  Date of Admission:  07/03/2023    Principal Problem:   Severe sepsis with septic shock [facility]) Active Problems:   Closed fracture of left distal humerus   Multiple myeloma not having achieved remission (HCC)   AKI (acute kidney injury) (HCC)   Acute hypoxemic respiratory failure (HCC)   PAF (paroxysmal atrial fibrillation) (HCC)   Acute metabolic encephalopathy   Lobar pneumonia, unspecified organism (HCC)   Protein-calorie malnutrition, severe   Aspergillosis, unspecified (HCC)   Hematoma of right thigh   Seizure disorder (HCC)   DMII (diabetes mellitus, type 2) (HCC)   Pressure injury of skin   CAD (coronary artery disease)   Chronic diastolic CHF (congestive heart failure) (HCC)   CKD (chronic kidney disease), stage IIIa (HCC)   Class 1 obesity   Asthma   Bipolar disorder (HCC)   Febrile neutropenia (HCC)   Generalized weakness   Anemia          Assessment: 58 year old male with past medical history of obesity, diabetes type 2, seizure, remote history of EtOH abuse admitted with staph epi bacteremia treated with 2 weeks of vancomycin  EOT 6/28, negative TEE ID reengaged given right hip infected hematoma: #Right hip hematoma/abscess status post aspiration on 7/11 - CT has shown large fluid collection extending along the superficial fascia margin favoring chronic hematoma.  Patient reports that he started to hurt over the weekend. - On 7/11 underwent ultrasound-guided aspiration, noted that fluid appeared to be infected hematoma.  Cultures are negative. - Given recent bacteremia history we will plan to treat with 2 weeks from aspiration Recommendations: Vanc + augmentin  x 2 weeks form aspiration EOT 7/24 -Conveyed to priamary -ID will SO  Evaluation of this patient requires complex antimicrobial therapy evaluation and counseling + isolation needs for disease transmission risk assessment and mitigation    Microbiology:    Antibiotics:  vanco + ceftrixone and metronidaozle   SUBJECTIVE: Rsting in bed. Family at bedside Interval: Afebrile overnight, wbc 4.3k  Review of Systems: Review of Systems  All other systems reviewed and are negative.    Scheduled Meds:  acyclovir   400 mg Oral BID   amoxicillin -clavulanate  1 tablet Oral Q12H   enoxaparin  (LOVENOX ) injection  50 mg Subcutaneous Daily   feeding supplement  237 mL Oral BID WC   feeding supplement (PROSource TF20)  60 mL Per Tube BID   ferrous sulfate   325 mg Oral Q breakfast   free water   50 mL Per Tube Daily   guaiFENesin   600 mg Oral BID   insulin  aspart  0-15 Units Subcutaneous TID AC & HS   insulin  aspart  2 Units Subcutaneous TID AC & HS   insulin  glargine-yfgn  10 Units Subcutaneous Q24H   lidocaine   1 patch Transdermal Q24H   montelukast   5 mg Oral QHS   multivitamin with minerals  1 tablet Oral Daily   nutrition supplement (JUVEN)  1 packet Oral BID BM   mouth rinse  15 mL Mouth Rinse 4 times per day   pantoprazole   40 mg Oral BID   PARoxetine   40 mg Oral Daily   polyethylene glycol  17 g Oral BID   senna  1 tablet Oral Daily   sodium chloride  flush  10-40 mL Intracatheter Q12H   sodium hypochlorite   Irrigation Daily   umeclidinium-vilanterol  1 puff Inhalation Daily   valproic  acid  750 mg Oral BID   voriconazole   400 mg Oral Q12H   Continuous Infusions:  sodium chloride  Stopped (08/28/23 1208)   sodium chloride  Stopped (08/28/23 9160)   vancomycin  1,500 mg (09/29/23 1113)   PRN Meds:.acetaminophen  (TYLENOL ) oral liquid 160 mg/5 mL **OR** acetaminophen , alum & mag hydroxide-simeth, benzonatate , hydrALAZINE , hydrOXYzine , ipratropium-albuterol , ondansetron  (ZOFRAN ) IV, oxyCODONE , artificial tears, sodium chloride  flush Allergies  Allergen Reactions   Morphine  And Codeine Anaphylaxis   Shellfish Allergy Anaphylaxis   Betadine  [Povidone Iodine ] Itching   Bupropion Other (See Comments)    Shaking of the body,  hallucinations    Chlorhexidine  Hives    Patient reports never had issues CHG with mouth rinse.   Influenza Vaccines Hives   Metoprolol  Rash    OBJECTIVE: Vitals:   09/29/23 0325 09/29/23 0722 09/29/23 0915 09/29/23 1145  BP: 126/89 (!) 123/90  139/80  Pulse: 96 92 94 95  Resp: 18 16 20 18   Temp: 99.6 F (37.6 C) 98.2 F (36.8 C)  98.4 F (36.9 C)  TempSrc: Oral Oral  Oral  SpO2: 97% 97% 96% 97%  Weight:      Height:       Body mass index is 30.16 kg/m.  Physical Exam Constitutional:      General: He is not in acute distress.    Appearance: He is normal weight. He is not toxic-appearing.  HENT:     Head: Normocephalic and atraumatic.     Right Ear: External ear normal.     Left Ear: External ear normal.     Nose: No congestion or rhinorrhea.     Mouth/Throat:     Mouth: Mucous membranes are moist.     Pharynx: Oropharynx is clear.  Eyes:     Extraocular Movements: Extraocular movements intact.     Conjunctiva/sclera: Conjunctivae normal.     Pupils: Pupils are equal, round, and reactive to light.  Cardiovascular:     Rate and Rhythm: Normal rate and regular rhythm.     Heart sounds: No murmur heard.    No friction rub. No gallop.  Pulmonary:     Effort: Pulmonary effort is normal.     Breath sounds: Normal breath sounds.  Abdominal:     General: Abdomen is flat. Bowel sounds are normal.     Palpations: Abdomen is soft.  Musculoskeletal:        General: No swelling.     Cervical back: Normal range of motion and neck supple.  Skin:    General: Skin is warm and dry.  Neurological:     General: No focal deficit present.     Mental Status: He is oriented to person, place, and time.  Psychiatric:        Mood and Affect: Mood normal.       Lab Results Lab Results  Component Value Date   WBC 3.9 (L) 09/29/2023   HGB 8.4 (L) 09/29/2023   HCT 25.9 (L) 09/29/2023   MCV 107.5 (H) 09/29/2023   PLT 298 09/29/2023    Lab Results  Component Value Date    CREATININE 0.94 09/29/2023   BUN 46 (H) 09/29/2023   NA 130 (L) 09/29/2023   K 4.3 09/29/2023   CL 98 09/29/2023   CO2 22 09/29/2023    Lab Results  Component Value Date   ALT 6 09/27/2023   AST 9 (L) 09/27/2023   ALKPHOS 71 09/27/2023   BILITOT 0.4 09/27/2023        Frank Stank, MD [facility]  09/29/2023, 12:25 PM

## 2023-09-29 NOTE — Progress Notes (Signed)
 Physical Therapy Treatment Patient Details Name: Frank [name]. MRN: [id number] DOB: [birth year] Today's Date: 09/29/2023   History of Present Illness 58 y.o. male transferred from [facility] 07/03/23 to [facility] for management of newly diagnosed plasma cell myeloma with weakness and AMS. Pt also with AKI and metabolic encephalopathy. 5/10 transfer to [facility] for iHD. 4/22 Intubated and chemo initiated. 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 5/6 IVIG started. 5/8-6/11 trach. 5/12 iHD initiated. 5/29 PEG placed. 6/5 chemo initiated. PMHx:Lt THA, carpal tunnel syndrome, Lt TKA, Lt humerus fx, PAF, HTN, HLD, bipolar disorder, obesity, T2DM    PT Comments  STAR PT/OT Session: Pt looking forward to therapy arrival and is hopeful that he will be able to do more today now that his hematoma is draining and his R leg has more feeling in it. Pt reports that it still hurts though. Worked on sit to stands from elevated bed to [employer] bar. Stedy turned around to decreased pressure on his R knee with standing. Pt is able to perform 3 x sit to stand with maxAx2 and increased duration. Pt has increased fatigue and request to return to bed. Pt requires pericare on return to bed and requires maxAx2 for rolling for pericare and clean pad placement. Pt continues to be optimistic about his progress. D/c plan remains appropriate at this time. STAR will continue to follow acutely.     If plan is discharge home, recommend the following: Two people to help with walking and/or transfers;Assistance with cooking/housework;Assist for transportation;Two people to help with bathing/dressing/bathroom;Direct supervision/assist for medications management;Direct supervision/assist for financial management;Help with stairs or ramp for entrance;Assistance with feeding   Can travel by private vehicle     No  Equipment Recommendations  Hospital bed;Hoyer lift;Wheelchair (measurements PT);Wheelchair cushion (measurements PT);BSC/3in1        Precautions / Restrictions Precautions Precautions: Fall Recall of Precautions/Restrictions: Impaired Precaution/Restrictions Comments: episode of syncope, sacral wound, chemo. Lt humerus non-union, splinted. no elbow ROM, but shoulder ROM ok per secure chat with Dr. Jonel 7/1 Required Braces or Orthoses: Sling Restrictions Weight Bearing Restrictions Per Provider Order: Yes LUE Weight Bearing Per Provider Order: Non weight bearing Other Position/Activity Restrictions: Lt humerus non-union, splinted. no elbow ROM, but shoulder ROM ok per secure chat with Dr. Jonel 7/1     Mobility  Bed Mobility Overal bed mobility: Needs Assistance Bed Mobility: Rolling, Supine to Sit, Sit to Sidelying Rolling: Max assist   Supine to sit: Max assist, HOB elevated, Used rails Sit to supine: Max assist, +2 for physical assistance   General bed mobility comments: patient assisting with moving BLEs off EOB but continues to require assistance to complete and assistance with trunk and scooting to EOB    Transfers Overall transfer level: Needs assistance Equipment used: Ambulation equipment used Transfers: Sit to/from Stand Sit to Stand: Max assist, +2 physical assistance, +2 safety/equipment           General transfer comment: stood x3 from EOB with back of Stedy to avoid increasing RLE knee pain. Patient demonstrating increased standing tolerance            Balance Overall balance assessment: Needs assistance Sitting-balance support: Single extremity supported, Feet supported Sitting balance-Leahy Scale: Fair Sitting balance - Comments: CGA for safety Postural control: Right lateral lean Standing balance support: Single extremity supported, During functional activity, Reliant on assistive device for balance Standing balance-Leahy Scale: Zero Standing balance comment: stood x3 from EOB using back of Stedy to avoid increased RLE knee pain  with max assist +2                             Communication Communication Communication: Impaired Factors Affecting Communication: Reduced clarity of speech  Cognition Arousal: Alert Behavior During Therapy: WFL for tasks assessed/performed                             Following commands: Impaired Following commands impaired: Follows one step commands with increased time    Cueing Cueing Techniques: Verbal cues, Tactile cues, Visual cues     General Comments General comments (skin integrity, edema, etc.): VSS on RA      Pertinent Vitals/Pain Pain Assessment Pain Assessment: Faces Faces Pain Scale: Hurts little more Pain Location: RLE and right flank, drain site Pain Descriptors / Indicators: Discomfort, Grimacing, Burning Pain Intervention(s): Limited activity within patient's tolerance, Monitored during session, Repositioned     PT Goals (current goals can now be found in the care plan section) Acute Rehab PT Goals PT Goal Formulation: With patient Time For Goal Achievement: 10/09/23 Potential to Achieve Goals: Fair Progress towards PT goals: Progressing toward goals    Frequency    Min 5X/week           Co-evaluation   Reason for Co-Treatment: For patient/therapist safety;To address functional/ADL transfers PT goals addressed during session: Mobility/safety with mobility;Balance OT goals addressed during session: ADL's and self-care      AM-PAC PT 6 Clicks Mobility   Outcome Measure  Help needed turning from your back to your side while in a flat bed without using bedrails?: A Lot Help needed moving from lying on your back to sitting on the side of a flat bed without using bedrails?: Total Help needed moving to and from a bed to a chair (including a wheelchair)?: Total Help needed standing up from a chair using your arms (e.g., wheelchair or bedside chair)?: Total Help needed to walk in hospital room?: Total Help needed climbing 3-5 steps with a railing? : Total 6 Click  Score: 7    End of Session Equipment Utilized During Treatment: Gait belt Activity Tolerance: Patient limited by pain Patient left: in bed;with call bell/phone within reach Nurse Communication: Mobility status PT Visit Diagnosis: Other abnormalities of gait and mobility (R26.89);Muscle weakness (generalized) (M62.81);Difficulty in walking, not elsewhere classified (R26.2);Unsteadiness on feet (R26.81)     Time: 8659-8557 PT Time Calculation (min) (ACUTE ONLY): 62 min  Charges:    $Therapeutic Activity: 23-37 mins PT General Charges $$ ACUTE PT VISIT: 1 Visit                     Minyon Billiter B. Fleeta Lapidus PT, DPT Acute Rehabilitation Services Please use secure chat or  Call Office [phone]    [name] [facility] 09/29/2023, 3:30 PM

## 2023-09-29 NOTE — Plan of Care (Signed)
  Problem: Pain Managment: Goal: General experience of comfort will improve and/or be controlled Outcome: Progressing   Problem: Safety: Goal: Ability to remain free from injury will improve Outcome: Progressing   Problem: Skin Integrity: Goal: Risk for impaired skin integrity will decrease Outcome: Progressing

## 2023-09-29 NOTE — Plan of Care (Signed)
  Problem: Education: Goal: Knowledge of General Education information will improve Description: Including pain rating scale, medication(s)/side effects and non-pharmacologic comfort measures Outcome: Progressing   Problem: Health Behavior/Discharge Planning: Goal: Ability to manage health-related needs will improve Outcome: Progressing   Problem: Clinical Measurements: Goal: Ability to maintain clinical measurements within normal limits will improve Outcome: Progressing Goal: Will remain free from infection Outcome: Progressing Goal: Diagnostic test results will improve Outcome: Progressing Goal: Respiratory complications will improve Outcome: Progressing Goal: Cardiovascular complication will be avoided Outcome: Progressing   Problem: Activity: Goal: Risk for activity intolerance will decrease Outcome: Progressing   Problem: Nutrition: Goal: Adequate nutrition will be maintained Outcome: Progressing   Problem: Coping: Goal: Level of anxiety will decrease Outcome: Progressing   Problem: Elimination: Goal: Will not experience complications related to bowel motility Outcome: Progressing Goal: Will not experience complications related to urinary retention Outcome: Progressing   Problem: Pain Managment: Goal: General experience of comfort will improve and/or be controlled Outcome: Progressing   Problem: Safety: Goal: Ability to remain free from injury will improve Outcome: Progressing   Problem: Skin Integrity: Goal: Risk for impaired skin integrity will decrease Outcome: Progressing   Problem: Education: Goal: Ability to describe self-care measures that may prevent or decrease complications (Diabetes Survival Skills Education) will improve Outcome: Progressing Goal: Individualized Educational Video(s) Outcome: Progressing   Problem: Coping: Goal: Ability to adjust to condition or change in health will improve Outcome: Progressing   Problem: Fluid  Volume: Goal: Ability to maintain a balanced intake and output will improve Outcome: Progressing   Problem: Health Behavior/Discharge Planning: Goal: Ability to identify and utilize available resources and services will improve Outcome: Progressing Goal: Ability to manage health-related needs will improve Outcome: Progressing   Problem: Metabolic: Goal: Ability to maintain appropriate glucose levels will improve Outcome: Progressing   Problem: Nutritional: Goal: Maintenance of adequate nutrition will improve Outcome: Progressing Goal: Progress toward achieving an optimal weight will improve Outcome: Progressing   Problem: Skin Integrity: Goal: Risk for impaired skin integrity will decrease Outcome: Progressing   Problem: Tissue Perfusion: Goal: Adequacy of tissue perfusion will improve Outcome: Progressing   Problem: Education: Goal: Knowledge about tracheostomy care/management will improve Outcome: Progressing   Problem: Activity: Goal: Ability to tolerate increased activity will improve Outcome: Progressing   Problem: Health Behavior/Discharge Planning: Goal: Ability to manage tracheostomy will improve Outcome: Progressing   Problem: Respiratory: Goal: Patent airway maintenance will improve Outcome: Progressing   Problem: Role Relationship: Goal: Ability to communicate will improve Outcome: Progressing   Problem: Education: Goal: Knowledge of disease and its progression will improve Outcome: Progressing Goal: Individualized Educational Video(s) Outcome: Progressing   Problem: Fluid Volume: Goal: Compliance with measures to maintain balanced fluid volume will improve Outcome: Progressing   Problem: Health Behavior/Discharge Planning: Goal: Ability to manage health-related needs will improve Outcome: Progressing   Problem: Nutritional: Goal: Ability to make healthy dietary choices will improve Outcome: Progressing   Problem: Clinical  Measurements: Goal: Complications related to the disease process, condition or treatment will be avoided or minimized Outcome: Progressing

## 2023-09-29 NOTE — Progress Notes (Signed)
 Referring Provider(s): Montano,Emil  Supervising Physician: Philip Cornet  Patient Status:  [facility] - In-pt  Chief Complaint:  Right hip fluid collection s/p percutaneous drain placement with Dr. Jenna 09/26/23   Subjective:  Pt seen lying in bed, states he has been feeling better since drain placement.  Pt states his leg feels warm to the touch and feels a burning sensation.  All pt questions answered.    Allergies: Morphine  and codeine, Shellfish allergy, Betadine  [povidone iodine ], Bupropion, Chlorhexidine , Influenza vaccines, and Metoprolol   Medications: Prior to Admission medications   Medication Sig Start Date End Date Taking? Authorizing Provider  albuterol  (PROVENTIL  HFA;VENTOLIN  HFA) 108 (90 Base) MCG/ACT inhaler Inhale 2 puffs into the lungs every 6 (six) hours as needed for wheezing or shortness of breath.   Yes [provider]  albuterol  (PROVENTIL ) (2.5 MG/3ML) 0.083% nebulizer solution 2.5 mg every 2 (two) hours as needed for wheezing or shortness of breath.   Yes [provider]  aspirin  EC 81 MG tablet Take 81 mg by mouth daily. Swallow whole.   Yes [provider]  busPIRone  (BUSPAR ) 15 MG tablet Take 15 mg by mouth at bedtime.   Yes [provider]  celecoxib  (CELEBREX ) 200 MG capsule Take 200 mg by mouth daily.   Yes [provider]  clonazePAM  (KLONOPIN ) 1 MG tablet Take 1 mg by mouth once as needed for anxiety.   Yes [provider]  cyclobenzaprine  (FLEXERIL ) 5 MG tablet Take 1 tablet (5 mg total) by mouth 3 (three) times daily as needed for muscle spasms. Patient taking differently: Take 5 mg by mouth daily as needed for muscle spasms. 01/30/23  Yes McClung, Lauraine LABOR, PA-C  divalproex  (DEPAKOTE ) 500 MG DR tablet Take 500 mg by mouth 2 (two) times daily.   Yes [provider]  EPINEPHrine  0.3 mg/0.3 mL IJ SOAJ injection Inject 0.3 mg into the muscle as needed for anaphylaxis. 08/28/21  Yes [provider]  esomeprazole (NEXIUM) 40 MG capsule Take 40 mg by mouth in the morning.   Yes [provider]  Fluticasone -Salmeterol (ADVAIR) 500-50 MCG/DOSE AEPB Inhale 2 puffs into the lungs in the morning.   Yes [provider]  furosemide  (LASIX ) 40 MG tablet Take 40 mg by mouth daily as needed for fluid or edema.   Yes [provider]  gabapentin  (NEURONTIN ) 300 MG capsule Take 300-600 mg by mouth See admin instructions. 300 mg in the morning, 600 mg at bedtime   Yes [provider]  hydrALAZINE  (APRESOLINE ) 10 MG tablet Take 10 mg by mouth in the morning and at bedtime.   Yes [provider]  hydrochlorothiazide  (HYDRODIURIL ) 25 MG tablet Take 25 mg by mouth daily.   Yes [provider]  lisinopril  (ZESTRIL ) 40 MG tablet Take 40 mg by mouth daily. 03/25/23  Yes [provider]  metFORMIN  (GLUCOPHAGE ) 1000 MG tablet Take 1,000 mg by mouth 2 (two) times daily with a meal.   Yes [provider]  montelukast  (SINGULAIR ) 10 MG tablet Take 10 mg by mouth at bedtime.   Yes [provider]  OVER THE COUNTER MEDICATION Take 400 mg by mouth at bedtime. Burdock root   Yes [provider]  oxyCODONE  (ROXICODONE ) 5 MG immediate release tablet Take 1 tablet (5 mg total) by mouth every 4 (four) hours as needed for severe pain (pain score 7-10). 01/30/23  Yes Danton Lauraine LABOR, PA-C  PARoxetine  (PAXIL ) 40 MG tablet Take 40 mg by mouth  every morning.   Yes [provider]  spironolactone  (ALDACTONE ) 25 MG tablet Take 25 mg by mouth at bedtime. 10/07/21  Yes [provider]  tamsulosin  (FLOMAX ) 0.4 MG CAPS capsule Take 0.4 mg by mouth at bedtime. 10/07/21  Yes [provider]  testosterone  cypionate (DEPOTESTOSTERONE CYPIONATE) 200 MG/ML injection Inject 0.5 mLs (100 mg total) into the muscle every 14 (fourteen) days. 09/17/16  Yes Nida, Gebreselassie W, MD  verapamil  (CALAN -SR) 120 MG CR tablet Take  120 mg by mouth daily.   Yes [provider]  Vitamin D , Ergocalciferol , (DRISDOL ) 1.25 MG (50000 UNIT) CAPS capsule Take 1 capsule (50,000 Units total) by mouth every Thursday. Patient taking differently: Take 50,000 Units by mouth once a week. Tuesday 02/06/23  Yes Danton Lauraine LABOR, PA-C  buPROPion (WELLBUTRIN XL) 150 MG 24 hr tablet Take 150 mg by mouth daily. Patient not taking: Reported on 07/04/2023    [provider]     Vital Signs: BP 139/80 (BP Location: Right Wrist)   Pulse 95   Temp 98.4 F (36.9 C) (Oral)   Resp 18   Ht 6' 0.01 (1.829 m)   Wt 222 lb 7.1 oz (100.9 kg)   SpO2 97%   BMI 30.16 kg/m   Physical Exam Constitutional:      Appearance: Normal appearance.  Skin:    Comments: Right hip drain F/A easily, statlock and suture in place  Skin clean, dry, without signs of infection  Output 30 mL dark red, thin to JP  Neurological:     Mental Status: He is alert and oriented to person, place, and time.  Psychiatric:        Mood and Affect: Mood normal.        Behavior: Behavior normal.      Labs:  CBC: Recent Labs    09/26/23 0447 09/27/23 0931 09/28/23 1634 09/29/23 1006  WBC 4.7 4.5 4.3 3.9*  HGB 8.0* 7.9* 8.3* 8.4*  HCT 24.0* 24.0* 25.6* 25.9*  PLT 261 262 293 298    COAGS: Recent Labs    07/05/23 2227 07/07/23 2030 07/10/23 0411 07/23/23 0416 07/24/23 0421 07/25/23 0409 07/26/23 0423 08/13/23 0908  INR 2.1* 1.9*  --   --   --   --   --  1.0  APTT  --  44*   < > 39* 45* 34 36  --    < > = values in this interval not displayed.    BMP: Recent Labs    09/25/23 1432 09/26/23 0447 09/27/23 0931 09/29/23 1006  NA 128* 129* 132* 130*  K 4.6 4.3 4.5 4.3  CL 98 97* 101 98  CO2 19* 22 22 22   GLUCOSE 127* 88 133* 134*  BUN 42* 43* 42* 46*  CALCIUM  7.8* 8.1* 8.3* 8.6*  CREATININE 0.93 0.89 0.93 0.94  GFRNONAA >60 >60 >60 >60    LIVER FUNCTION TESTS: Recent Labs    09/24/23 0806 09/24/23 0809 09/25/23 1432  09/26/23 0447 09/27/23 0931  BILITOT 0.4  --  0.4 0.4 0.4  AST 12*  --  9* 11* 9*  ALT 7  --  7 7 6   ALKPHOS 84  --  79 76 71  PROT 5.3*  --  5.2* 5.3* 5.3*  ALBUMIN  2.1* 2.1* 1.9* 1.9* 1.9*    Assessment and Plan:  Right hip fluid collection s/p percutaneous drain placement with Dr. Jenna 09/26/23   Drain Location: Right hip  Size: Fr size: 10 Fr Date of placement:  09/26/23  Currently to: Drain collection device: suction bulb 24 hour output:  Output by Drain (mL) 09/27/23 0701 - 09/27/23 1900 09/27/23 1901 - 09/28/23 0700 09/28/23 0701 - 09/28/23 1900 09/28/23 1901 - 09/29/23 0700 09/29/23 0701 - 09/29/23 1158  Closed System Drain Right Buttock Bulb (JP) 10 Fr. 145 120 65 240   Gastrostomy/Enterostomy Gastrostomy 20 Fr. LUQ         Interval imaging/drain manipulation:  None   Current examination: Flushes/aspirates easily.  Insertion site unremarkable. Suture and stat lock in place. Dressed appropriately.   Plan: Continue TID flushes with 5 cc NS. Record output Q shift. Dressing changes QD or PRN if soiled.  Call IR APP or on call IR MD if difficulty flushing or sudden change in drain output.  Repeat imaging/possible drain injection once output < 10 mL/QD (excluding flush material). Consideration for drain removal if output is < 10 mL/QD (excluding flush material), pending discussion with the providing surgical service.  Discharge planning: Please contact IR APP or on call IR MD prior to patient d/c to ensure appropriate follow up plans are in place. Typically patient will follow up with IR clinic 10-14 days post d/c for repeat imaging/possible drain injection. IR scheduler will contact patient with date/time of appointment. Patient will need to flush drain QD with 5 cc NS, record output QD, dressing changes every 2-3 days or earlier if soiled.   IR will continue to follow - please call with questions or concerns.  Electronically Signed: Lavanda JAYSON Jurist,  PA-C 09/29/2023, 11:58 AM    I spent a total of 15 Minutes at the the patient's bedside AND on the patient's hospital floor or unit, greater than 50% of which was counseling/coordinating care for Right hip fluid collection s/p percutaneous drain placement with Dr. Jenna 09/26/23.

## 2023-09-29 NOTE — Consult Note (Signed)
 WOC Nurse wound follow up Wound type: Stage 3 sacrum; appears to be clean (5cm x 3cm  Stage 3 right ischium unable to measure today Stage 2 left ischium unable to measure today Patient's bed was not working, would not allow us  to put the [facility] down to full turn patient for assessment of ischial wounds.  Measurement: see nursing flow sheets Wound bed: pink, clean Drainage (amount, consistency, odor) no evidence of blue green drainage today  Periwound: intact  Dressing procedure/placement/frequency: R buttocks/ L buttocks: cleanse wound with Vashe Soila # [id number]) allow to air dry and cover with foam dressing, lift dressing daily to view wound and cleanse and change foam dressing Q 3 days and PRN soiling.  Sacrum: Cleanse wound with NS allow to air dry, apply Santyl  to wound bed, cover with saline moistened gauze, cover with foam  change daily and PRN soiling.  LALM in place for moisture management and pressure redistribution.   WOC Nurse team will follow with you and see patient within 10 days for wound assessments.  Please notify WOC nurses of any acute changes in the wounds or any new areas of concern Leovanni Bjorkman [facility] MSN, RN,CWOCN, CNS, CWON-AP [phone]

## 2023-09-29 NOTE — Progress Notes (Signed)
 Occupational Therapy Treatment Patient Details Name: Frank [name]. MRN: [id number] DOB: 1965/06/18 Today's Date: 09/29/2023   History of present illness 58 y.o. male transferred from [facility] 07/03/23 to [facility] for management of newly diagnosed plasma cell myeloma with weakness and AMS. Pt also with AKI and metabolic encephalopathy. 5/10 transfer to [facility] for iHD. 4/22 Intubated and chemo initiated. 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 5/6 IVIG started. 5/8-6/11 trach. 5/12 iHD initiated. 5/29 PEG placed. 6/5 chemo initiated. PMHx:Lt THA, carpal tunnel syndrome, Lt TKA, Lt humerus fx, PAF, HTN, HLD, bipolar disorder, obesity, T2DM   OT comments  STAR Patient OT/PT session: Patient stating decreased pain at RLE and increased movement. Patient assisted with donning gown and sling prior to getting to EOB. Patient instructed on bed mobility with increased assistance from patient with moving legs towards EOB but continues to require max assist of one to complete. Patient performed standing from EOB to back of Stedy to avoid RLE knee pain. Patient performed 3 stands with cues for breathing and weight shifting. Patient assisted back to bed with max assist +2. Patient performed rolling in bed to allow for peri area bottom cleaning due to noticing stain on pad. Acute OT to continue to follow with STAR program to address established goals.       If plan is discharge home, recommend the following:  Two people to help with walking and/or transfers;Assistance with cooking/housework;Assistance with feeding;Direct supervision/assist for medications management;Direct supervision/assist for financial management;Assist for transportation;Help with stairs or ramp for entrance;A lot of help with bathing/dressing/bathroom   Equipment Recommendations  Other (comment) (TBD)    Recommendations for Other Services      Precautions / Restrictions Precautions Precautions: Fall Recall of Precautions/Restrictions:  Impaired Precaution/Restrictions Comments: episode of syncope, sacral wound, chemo. Lt humerus non-union, splinted. no elbow ROM, but shoulder ROM ok per secure chat with Dr. Jonel 7/1 Required Braces or Orthoses: Sling Restrictions Weight Bearing Restrictions Per Provider Order: Yes LUE Weight Bearing Per Provider Order: Non weight bearing Other Position/Activity Restrictions: Lt humerus non-union, splinted. no elbow ROM, but shoulder ROM ok per secure chat with Dr. Jonel 7/1       Mobility Bed Mobility Overal bed mobility: Needs Assistance Bed Mobility: Rolling, Supine to Sit, Sit to Sidelying Rolling: Max assist   Supine to sit: Max assist, HOB elevated, Used rails Sit to supine: Max assist, +2 for physical assistance   General bed mobility comments: patient assisting with moving BLEs off EOB but continues to require assistance to complete and assistance with trunk and scooting to EOB    Transfers Overall transfer level: Needs assistance Equipment used: Ambulation equipment used Transfers: Sit to/from Stand Sit to Stand: Max assist, +2 physical assistance, +2 safety/equipment           General transfer comment: stood x3 from EOB with back of Stedy to avoid increasing RLE knee pain. Patient demonstrating increased standing tolerance     Balance Overall balance assessment: Needs assistance Sitting-balance support: Single extremity supported, Feet supported Sitting balance-Leahy Scale: Fair Sitting balance - Comments: CGA for safety Postural control: Right lateral lean Standing balance support: Single extremity supported, During functional activity, Reliant on assistive device for balance Standing balance-Leahy Scale: Zero Standing balance comment: stood x3 from EOB using back of Stedy to avoid increased RLE knee pain with max assist +2  ADL either performed or assessed with clinical judgement   ADL Overall ADL's : Needs  assistance/impaired     Grooming: Wash/dry face;Wash/dry hands;Set up;Sitting Grooming Details (indicate cue type and reason): on EOB     Lower Body Bathing: Total assistance;Bed level Lower Body Bathing Details (indicate cue type and reason): peri area bathing at bed level Upper Body Dressing : Moderate assistance;Sitting Upper Body Dressing Details (indicate cue type and reason): gown and sling                   General ADL Comments: gown donned at bed level with LUE in sling    Extremity/Trunk Assessment              Vision       Perception     Praxis     Communication Communication Communication: Impaired Factors Affecting Communication: Reduced clarity of speech   Cognition Arousal: Alert Behavior During Therapy: WFL for tasks assessed/performed Cognition: Cognition impaired     Awareness: Intellectual awareness intact   Attention impairment (select first level of impairment): Sustained attention Executive functioning impairment (select all impairments): Problem solving                   Following commands: Impaired Following commands impaired: Follows one step commands with increased time      Cueing   Cueing Techniques: Verbal cues, Tactile cues, Visual cues  Exercises      Shoulder Instructions       General Comments VSS    Pertinent Vitals/ Pain       Pain Assessment Pain Assessment: Faces Faces Pain Scale: Hurts little more Pain Location: RLE and right flank, drain site Pain Descriptors / Indicators: Discomfort, Grimacing, Burning Pain Intervention(s): Monitored during session, Repositioned  Home Living                                          Prior Functioning/Environment              Frequency  Min 1X/week (STAR Patient)        Progress Toward Goals  OT Goals(current goals can now be found in the care plan section)  Progress towards OT goals: Progressing toward goals  Acute Rehab OT  Goals Patient Stated Goal: to go home and see grandson OT Goal Formulation: With patient Time For Goal Achievement: 10/09/23 Potential to Achieve Goals: Fair ADL Goals Pt Will Perform Grooming: with supervision;with set-up;sitting Pt Will Perform Upper Body Dressing: with contact guard assist;sitting Pt Will Transfer to Toilet: bedside commode Pt/caregiver will Perform Home Exercise Program: Right Upper extremity;Increased strength;With written HEP provided;Independently Additional ADL Goal #1: Pt will complete bed mobility with mod A as a precursor to EOB ADLs  Plan      Co-evaluation    PT/OT/SLP Co-Evaluation/Treatment: Yes Reason for Co-Treatment: For patient/therapist safety;To address functional/ADL transfers PT goals addressed during session: Mobility/safety with mobility;Balance OT goals addressed during session: ADL's and self-care      AM-PAC OT 6 Clicks Daily Activity     Outcome Measure   Help from another person eating meals?: A Little Help from another person taking care of personal grooming?: A Little Help from another person toileting, which includes using toliet, bedpan, or urinal?: Total Help from another person bathing (including washing, rinsing, drying)?: A Lot Help from another person to put on and taking off regular  upper body clothing?: A Lot Help from another person to put on and taking off regular lower body clothing?: Total 6 Click Score: 12    End of Session Equipment Utilized During Treatment: Gait belt;Other (comment) (sling, Stedy)  OT Visit Diagnosis: Unsteadiness on feet (R26.81);Other abnormalities of gait and mobility (R26.89);Muscle weakness (generalized) (M62.81);Pain Pain - Right/Left: Right Pain - part of body: Knee (left shoulder)   Activity Tolerance Patient tolerated treatment well   Patient Left in bed;with call bell/phone within reach   Nurse Communication Mobility status        Time: 1340-1444 OT Time Calculation (min):  64 min  Charges: OT General Charges $OT Visit: 1 Visit OT Treatments $Self Care/Home Management : 8-22 mins $Therapeutic Activity: 8-22 mins  .rlo  [name] 09/29/2023, 3:01 PM

## 2023-09-29 NOTE — Progress Notes (Signed)
 Progress Note   Patient: Frank [name]. FMW:[id number] DOB: December 08, 1965 DOA: 07/03/2023     88 DOS: the patient was seen and examined on 09/29/2023   Brief hospital course: 58 year old PMH OSA CPAP, pathological fracture and multiple ORIF left Humerus likely due to multiple myeloma, A-fib not on anticoagulation, hyperlipidemia, tobacco use disorder who was feeling weak for 1 week, admitted to [facility] health with concern of sepsis, community-acquired pneumonia and acute encephalopathy. Patient was found to have lytic lesions, he was transferred to [facility] for oncology treatment. In the meantime he developed encephalopathy, acute renal failure. He remained in the ICU for 36 days. Underwent tracheostomy. He was also started on HD after CRRT,off of HD now. Subsequently PEG tube placed 5/29 and IR drain placement of right thigh hip infected hematoma on 7/11 .   Assessment and Plan:  Infected Right Thigh Hematoma:   Underwent IR drain placement for infected Hematoma on 7/1.  Continue IV Vancomycin  and Augmentin  x 2 weeks as per ID (Dr Dennise). Follow cultures.   Sepsis in the setting of Staph Epi Bacteremia - [facility] course complicated by intermittent fevers, ID consulted and most recently evaluated patient 6/17.   -He had a fever and MRSE bacteremia 1/4 bottles, completed a course of vancomycin  with the end date 6/20.  He did receive a line holiday.  Repeat blood culture 6/9 without growth. - He has unstageable sacral ulcer, continue local wound care.    Multiple myeloma   Initiated chemotherapy on 08/21/2023. Initially given Velcade /Cytoxan . Now on Sarclisa /Kyprolis /Cytoxan .   -- Cycle 2 chemotherapy scheduled for 10/02/2023. - Myeloma responding well to treatment, IgG decreased from 11,507 to WNL 1470.   Left sided pleuritic chest wall pain: No rib fractures seen on CXR Prn analgesics   Aspergillosis with pneumonia -On Voriconazole  per ID, On Acyclovir .  End date for voriconazole  is 8/5,  continue.   Acute Hypoxic Respiratory Failure -  trach removed 6/11.  Respiratory status stable. No acute issues.   Severe acute metabolic encephalopathy with hypoactive delirium, seizure disorder - Normal MRI of the brain.  LP negative for meningitis or encephalitis.  EEG negative for seizures but encephalopathy.  -Resolved   Insulin -dependent diabetes type 2- A1c 6.0, continue with sliding scale insulin .     AKI on CKD stage IIIA, Uremia,  Hyponatremia, Hypokalemia -Due to MM, hypercalcemia and ACE inhibitors?  Nephrology consulted, was on hemodialysis but eventually he tolerated being off HD, tunneled catheter discontinued per nephrology.  Renal function is stable.   Paroxysmal A-fib - Unable to  anticoagulation due to hematoma.  Rate controlled   Obstructive Sleep Apnea -Trach removed. CPAP    Anxiety/Bipolar disorder - Continue with Paxil .     Severe protein calorie Malnutrition - Continue tube feeding via PEG.  He is allowed dysphagia 1 per speech. Continue with high protein supplementation.   History of Pathological fracture and multiple ORIF left Humerus 01/2023: -Dr. Jonel discussed with orthopedics on 6/26, options would involve either a substantial reconstruction with antibiotic cement and prolonged, difficult recovery, or above elbow amputation.  [name] recommends more rehabilitation at this time prior to attempting surgery. - Maintain left arm splint when out of bed - [name] following, since he is not clear when he will discharge likely to have surgery here in couple of weeks.      Subjective: He said that he is feeling better this morning and eating well.  He is still complaining of slight left-sided chest wall pain.  No acute issues/concerns this morning. He mentioned that he is supposed to have his left arm amputated either this week or next week.  Physical Exam: Vitals:   09/29/23 0013 09/29/23 0325 09/29/23 0722 09/29/23 0915  BP: 127/88 126/89 (!) 123/90    Pulse: 100 96 92 94  Resp: 18 18 16 20   Temp: 98.2 F (36.8 C) 99.6 F (37.6 C) 98.2 F (36.8 C)   TempSrc: Oral Oral Oral   SpO2: 97% 97% 97% 96%  Weight:      Height:       Constitutional: NAD, calm, comfortable Eyes: PERRL, lids and conjunctivae normal ENMT: Mucous membranes are moist. Posterior pharynx clear of any exudate or lesions.Normal dentition.  Neck: normal, supple, no masses, no thyromegaly, scar from trach removal site Respiratory: clear to auscultation bilaterally, no wheezing, no crackles. Normal respiratory effort. No accessory muscle use.  Cardiovascular: Regular rate and rhythm, no murmurs / rubs / gallops. No extremity edema. 2+ pedal pulses. No carotid bruits.  Abdomen: no tenderness, no masses palpated. No hepatosplenomegaly. Bowel sounds positive. PEG in place Musculoskeletal: no clubbing / cyanosis. No joint deformity upper and lower extremities. Good ROM, no contractures. Normal muscle tone.  Skin: no rashes, lesions, ulcers. No induration. Right hip drain in place Neurologic: Unable to move left arm, no other acute neuro deficits elsewhere Psychiatric: Normal judgment and insight. Alert and oriented x 3. Normal mood.     Data Reviewed:  There are no new results to review at this time.  Family Communication: None at bedside  Disposition: Status is: Inpatient Remains inpatient appropriate because: Physical deconditioning, sepsis  Planned Discharge Destination: Skilled nursing facility    Time spent: 43 minutes  Author: Deliliah Room, MD 09/29/2023 10:36 AM  For on call review [email or web address].

## 2023-09-30 ENCOUNTER — Inpatient Hospital Stay (HOSPITAL_COMMUNITY)

## 2023-09-30 DIAGNOSIS — R6521 Severe sepsis with septic shock: Secondary | ICD-10-CM | POA: Diagnosis not present

## 2023-09-30 DIAGNOSIS — A419 Sepsis, unspecified organism: Secondary | ICD-10-CM | POA: Diagnosis not present

## 2023-09-30 LAB — GLUCOSE, CAPILLARY
Glucose-Capillary: 123 mg/dL — ABNORMAL HIGH (ref 70–99)
Glucose-Capillary: 98 mg/dL (ref 70–99)
Glucose-Capillary: 85 mg/dL (ref 70–99)
Glucose-Capillary: 190 mg/dL — ABNORMAL HIGH (ref 70–99)

## 2023-09-30 LAB — VANCOMYCIN, PEAK: Vancomycin Pk: >60 ug/mL (ref 30–40)

## 2023-09-30 MED ORDER — VANCOMYCIN VARIABLE DOSE PER UNSTABLE RENAL FUNCTION (PHARMACIST DOSING): Status: DC

## 2023-09-30 NOTE — Progress Notes (Addendum)
 Physical Therapy Wound Treatment Patient Details  Name: Frank [name]. MRN: [id number] Date of Birth: [birth year]  Today's Date: 09/30/2023 Time: 8961-8849 Time Calculation (min): 72 min  Subjective  Subjective Assessment Patient and Family Stated Goals: to be able to see his grandson Date of Onset:  (exact date unknown) Prior Treatments: Sacral foam  Pain Score:  6/10 Premedicated   Wound Assessment  Pressure Injury 07/11/23 Sacrum Mid Unstageable - Full thickness tissue loss in which the base of the injury is covered by slough (yellow, tan, gray, green or brown) and/or eschar (tan, brown or black) in the wound bed. (Active)  Wound Image   09/30/23 1600  Dressing Type Santyl ;Barrier Film (skin prep);Foam - Lift dressing to assess site every shift;Gauze (Comment) 09/30/23 1600  Dressing Changed 09/30/23 1600  Dressing Change Frequency Daily 09/30/23 1600  State of Healing Early/partial granulation 09/30/23 1600  Site / Wound Assessment Clean;Yellow;Pink;Pale;Granulation tissue 09/30/23 1600  % Wound base Red or Granulating 40% 09/30/23 1600  % Wound base Yellow/Fibrinous Exudate 60% 09/30/23 1600  % Wound base Black/Eschar 0% 09/30/23 1600  % Wound base Other/Granulation Tissue (Comment) 0% 09/30/23 1600  Peri-wound Assessment Erythema (blanchable);Maceration 09/30/23 1600  Wound Length (cm) 5.1 cm 09/30/23 1600  Wound Width (cm) 4.2 cm 09/30/23 1600  Wound Depth (cm) 0.6 cm 09/30/23 1600  Wound Surface Area (cm^2) 21.42 cm^2 09/30/23 1600  Wound Volume (cm^3) 12.85 cm^3 09/30/23 1600  Margins Unattached edges (unapproximated) 09/30/23 1600  Drainage Amount Moderate 09/30/23 1600  Drainage Description Serous 09/30/23 1600  Treatment Cleansed;Debridement (Selective);Irrigation;Packing (Saline gauze) 09/30/23 1600           Wound Assessment and Plan  Wound Therapy - Assess/Plan/Recommendations Wound Therapy - Clinical Statement: pt continues to have PureFlow overflowing in  the middle of the night that causes his bed to be soaked in urine. Pt reports that NT had placed clean linen but had not cleansed his skin, while pt turned on side for wound therapy cleansed back and placed clean linens and gown. Pt wound periarea continues to have large patches of maceration, Pt wound bed continues to have small area of tightly adhered slough in middle of wound, however slough around the edges has softened and was removed with scalpel. Pt will continue to benefit from Santyl  to soften slough. Pt will benefit from additional selective debridement to reduce bioburden and promote healing. Wound Therapy - Functional Problem List: decreased mobility, cancer and chemo Factors Delaying/Impairing Wound Healing: Diabetes Mellitus, Multiple medical problems, Immobility, Polypharmacy Hydrotherapy Plan: Debridement, Dressing change, Patient/family education Wound Therapy - Frequency: 2X / week Wound Therapy - Current Recommendations: OT, PT, Case manager/social work Wound Therapy - Follow Up Recommendations: dressing changes by RN  Wound Therapy Goals- Improve the function of patient's integumentary system by progressing the wound(s) through the phases of wound healing (inflammation - proliferation - remodeling) by: Wound Therapy Goals - Improve the function of patient's integumentary system by progressing the wound(s) through the phases of wound healing by: Decrease Necrotic Tissue to: 50 Decrease Necrotic Tissue - Progress: Progressing toward goal Increase Granulation Tissue to: 30 Increase Granulation Tissue - Progress: Progressing toward goal Decrease Length/Width/Depth by (cm): L and W by 0.5 cm Decrease Length/Width/Depth - Progress: Progressing toward goal Goals/treatment plan/discharge plan were made with and agreed upon by patient/family: Yes Time For Goal Achievement: 7 days Wound Therapy - Potential for Goals: Fair  Goals will be updated until maximal potential achieved or  discharge criteria met.  Discharge criteria:  when goals achieved, discharge from hospital, MD decision/surgical intervention, no progress towards goals, refusal/missing three consecutive treatments without notification or medical reason.  GP     Charges PT Wound Care Charges $Wound Debridement up to 20 cm: < or equal to 20 cm $PT Hydrotherapy Dressing: 3 dressings     Onaje Warne B. Fleeta Lapidus PT, DPT Acute Rehabilitation Services Please use secure chat or  Call Office [phone]   [name] [facility] 09/30/2023, 5:12 PM

## 2023-09-30 NOTE — Progress Notes (Signed)
 Physical Therapy Treatment Patient Details Name: Frank [name]. MRN: [id number] DOB: Mar 04, 1966 Today's Date: 09/30/2023   History of Present Illness 58 y.o. male transferred from [facility] 07/03/23 to [facility] for management of newly diagnosed plasma cell myeloma with weakness and AMS. Pt also with AKI and metabolic encephalopathy. 5/10 transfer to [facility] for iHD. 4/22 Intubated and chemo initiated. 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 5/6 IVIG started. 5/8-6/11 trach. 5/12 iHD initiated. 5/29 PEG placed. 6/5 chemo initiated. PMHx:Lt THA, carpal tunnel syndrome, Lt TKA, Lt humerus fx, PAF, HTN, HLD, bipolar disorder, obesity, T2DM    PT Comments  STAR PT/OT Session: Goal of session to get up to recliner. Pt eager to try. Pt is maxA for coming to EoB with heavy use of rails and bed pad scoot of hips to EoB. Pt with good carryover of sequencing for sit to stand in [place] to transfer to recliner, pt requires maxAx2 for coming to standing in [place], [place] moved to recliner. Pt able to stand with maxAx2 from Stedy pads to clear bottom but is unable to maintain and abruptly sits in chair before therapists can manage hips back in recliner. Pt experiences 10/10 pain as knee L knee has increased flexion in sitting. Pt tolerates sitting for 15+ minutes. Maximove is used to return patient to bed. Pt is making progress towards his goals. PT will continue to follow daily.    If plan is discharge home, recommend the following: Two people to help with walking and/or transfers;Assistance with cooking/housework;Assist for transportation;Two people to help with bathing/dressing/bathroom;Direct supervision/assist for medications management;Direct supervision/assist for financial management;Help with stairs or ramp for entrance;Assistance with feeding   Can travel by private vehicle     No  Equipment Recommendations  Hospital bed;Hoyer lift;Wheelchair (measurements PT);Wheelchair cushion (measurements PT);BSC/3in1        Precautions / Restrictions Precautions Precautions: Fall Recall of Precautions/Restrictions: Impaired Precaution/Restrictions Comments: episode of syncope, sacral wound, chemo. Lt humerus non-union, splinted. no elbow ROM, but shoulder ROM ok per secure chat with Dr. Jonel 7/1 Required Braces or Orthoses: Sling Restrictions Weight Bearing Restrictions Per Provider Order: Yes LUE Weight Bearing Per Provider Order: Non weight bearing Other Position/Activity Restrictions: Lt humerus non-union, splinted. no elbow ROM, but shoulder ROM ok per secure chat with Dr. Jonel 7/1     Mobility  Bed Mobility Overal bed mobility: Needs Assistance Bed Mobility: Rolling, Supine to Sit Rolling: Max assist   Supine to sit: Max assist, HOB elevated, Used rails     General bed mobility comments: able to get to EOB with max assist and use of pads, performed rolling to remove lift pad    Transfers Overall transfer level: Needs assistance Equipment used: Ambulation equipment used Transfers: Sit to/from Stand, Bed to chair/wheelchair/BSC Sit to Stand: Max assist, +2 physical assistance, +2 safety/equipment           General transfer comment: patient stood from EOB to [place] with max assist +2 with increased pain when sitting from [place] to [employer] utilized to return to bed Transfer via Lift Equipment: Maximove, Stedy      Balance Overall balance assessment: Needs assistance Sitting-balance support: Single extremity supported, Feet supported Sitting balance-Leahy Scale: Fair Sitting balance - Comments: CGA to supervision for safety when EOB   Standing balance support: Single extremity supported, During functional activity, Reliant on assistive device for balance Standing balance-Leahy Scale: Zero Standing balance comment: stood into stedy for transfer to recliner  Communication Communication Communication: Impaired Factors Affecting  Communication: Reduced clarity of speech  Cognition Arousal: Alert Behavior During Therapy: WFL for tasks assessed/performed                             Following commands: Impaired Following commands impaired: Follows one step commands with increased time    Cueing Cueing Techniques: Verbal cues, Tactile cues, Visual cues     General Comments General comments (skin integrity, edema, etc.): VSS on RA      Pertinent Vitals/Pain Pain Assessment Pain Assessment: Faces Faces Pain Scale: Hurts worst Pain Location: left shoulder and Right knee, increased pain at right knee from stand to sit Pain Descriptors / Indicators: Discomfort, Grimacing, Sharp, Moaning Pain Intervention(s): Limited activity within patient's tolerance, Monitored during session, Repositioned, RN gave pain meds during session, Patient requesting pain meds-RN notified     PT Goals (current goals can now be found in the care plan section) Acute Rehab PT Goals PT Goal Formulation: With patient Time For Goal Achievement: 10/09/23 Potential to Achieve Goals: Fair Progress towards PT goals: Progressing toward goals    Frequency    Min 5X/week           Co-evaluation PT/OT/SLP Co-Evaluation/Treatment: Yes Reason for Co-Treatment: For patient/therapist safety;To address functional/ADL transfers PT goals addressed during session: Mobility/safety with mobility;Balance OT goals addressed during session: ADL's and self-care      AM-PAC PT 6 Clicks Mobility   Outcome Measure  Help needed turning from your back to your side while in a flat bed without using bedrails?: A Lot Help needed moving from lying on your back to sitting on the side of a flat bed without using bedrails?: Total Help needed moving to and from a bed to a chair (including a wheelchair)?: Total Help needed standing up from a chair using your arms (e.g., wheelchair or bedside chair)?: Total Help needed to walk in hospital room?:  Total Help needed climbing 3-5 steps with a railing? : Total 6 Click Score: 7    End of Session Equipment Utilized During Treatment: Gait belt Activity Tolerance: Patient limited by pain Patient left: in bed;with call bell/phone within reach Nurse Communication: Mobility status PT Visit Diagnosis: Other abnormalities of gait and mobility (R26.89);Muscle weakness (generalized) (M62.81);Difficulty in walking, not elsewhere classified (R26.2);Unsteadiness on feet (R26.81)     Time: 1336-1430 PT Time Calculation (min) (ACUTE ONLY): 54 min  Charges:    $Therapeutic Activity: 23-37 mins PT General Charges $$ ACUTE PT VISIT: 1 Visit                     Linsy Ehresman B. Fleeta Lapidus PT, DPT Acute Rehabilitation Services Please use secure chat or  Call Office [phone]    [name] [facility] 09/30/2023, 4:33 PM

## 2023-09-30 NOTE — Progress Notes (Signed)
 Mr. Langham really looks fantastic.  It was so nice to talk to him.  He has a drain in that right thigh area.  All cultures have been negative.  He says little bit of swelling in the right thigh.  He says he has no numbness in the right foot now.  He was able to do physical therapy yesterday and stand.  This was following the first times that he has stood in a couple months.  I know that he is somewhat deconditioned.  It looks like he is able to eat pretty much regular food from what he tells me.  I really appreciate that he is able to eat better.  He, unfortunate, is going need to have the left arm amputated for what he says.  I am not sure when this is going to happen.  He has had a incredible response to myeloma therapy.  His last markers were down quite a bit.  His last monoclonal spike was down to 0.8 g/dL.  He was due for his next treatment this week.  However, I will hold off on this until we have this drain taken out of his right hip and he has his arm amputated.  Again I am not sure when the arm amputation will happen.  His labs yesterday showed a white count 3.9.  Hemoglobin 8.4.  Platelet count 298,000.  His sodium is 130.  Potassium 4.3.  BUN 46 creatinine 0.94.  Calcium  8.6.  His last total protein was 5.3 with an albumin  of 1.9.  Again, it was just so nice to be able to have some good fellowship with him.  I know this will be a very important week for him.  I really hate the fact that he may need to have amputation.  Again, we are going to hold on his chemotherapy.  I think he is done so well right now.  I think we have the luxury holding off on his chemotherapy for right now.  Hopefully, his physical therapy will continue to improve.  It is obvious that he is getting incredible care for everybody on 2 W.  Jeralyn Crease, MD  [place] 1:17

## 2023-09-30 NOTE — Plan of Care (Signed)

## 2023-09-30 NOTE — Progress Notes (Signed)
 Referring Physician(s): Montano,Emil  Supervising Physician: Vanice Revel  Patient Status:  [name] - In-pt  Chief Complaint:  S/p R hip drain placement 7/11 by Dr. Jenna   Brief [facility] Course: [name]. is a 58 y.o. male with past medical history significant for OSA, uses CPAP, multiple myeloma, A-fib (not on AC), hyperlipidemia, tobacco use disorder.  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 He was admitted to an outside [name] with concern for sepsis, pneumonia, encephalopathy after feeling weak for 1 week.  He developed renal failure while admitted in the ICU for 36 days.  His care required tracheostomy, HD, PEG tube placement.  Currently the patient is not requiring ventilator (room air) or hemodialysis.   While admitted patient developed right leg numbness and pain.  CT of the hip revealed a large fluid collection intermuscularly (gluteus medius and rectus femoris).  Ortho was consulted and recommended IR aspiration and drain placement which was approved and performed by Dr. Jenna on 7/11 (10Fr ).  Subjective:  Patient reports he initially felt improvement in decreased right leg swelling after drain placement, but he has more recently been experiencing burning around the insertion site, increasing right leg swelling, right thigh heat, increased pain.  Denies fever, chills, nausea, vomiting.  Allergies: Morphine  and codeine, Shellfish allergy, Betadine  [povidone iodine ], Bupropion, Chlorhexidine , Influenza vaccines, and Metoprolol   Medications: Prior to Admission medications   Medication Sig Start Date End Date Taking? Authorizing Provider  albuterol  (PROVENTIL  HFA;VENTOLIN  HFA) 108 (90 Base) MCG/ACT inhaler Inhale 2 puffs into the lungs every 6 (six) hours as needed for wheezing or shortness of breath.   Yes [provider]  albuterol  (PROVENTIL ) (2.5 MG/3ML) 0.083% nebulizer solution 2.5 mg every 2 (two) hours as needed for wheezing or shortness of breath.   Yes [provider]  aspirin  EC 81 MG tablet Take 81 mg by mouth daily. Swallow whole.   Yes [provider]  busPIRone  (BUSPAR ) 15 MG tablet Take 15 mg by mouth at bedtime.   Yes [provider]  celecoxib  (CELEBREX ) 200 MG capsule Take 200 mg by mouth daily.   Yes [provider]  clonazePAM  (KLONOPIN ) 1 MG tablet Take 1 mg by mouth once as needed for anxiety.   Yes [provider]  cyclobenzaprine  (FLEXERIL ) 5 MG tablet Take 1 tablet (5 mg total) by mouth 3 (three) times daily as needed for muscle spasms. Patient taking differently: Take 5 mg by mouth daily as needed for muscle spasms. 01/30/23  Yes Danton Lauraine LABOR, PA-C  divalproex  (DEPAKOTE ) 500 MG DR tablet Take 500 mg by mouth 2 (two) times daily.   Yes [provider]  EPINEPHrine  0.3 mg/0.3 mL IJ SOAJ injection Inject 0.3 mg into the muscle as needed for anaphylaxis. 08/28/21  Yes [provider]  esomeprazole (NEXIUM) 40 MG capsule Take 40 mg by mouth in the morning.   Yes [provider]  Fluticasone -Salmeterol (ADVAIR) 500-50 MCG/DOSE AEPB Inhale 2 puffs into the lungs in the morning.   Yes [provider]  furosemide  (LASIX ) 40 MG tablet Take 40 mg by mouth daily as needed for fluid or edema.   Yes [provider]  gabapentin  (NEURONTIN ) 300 MG capsule Take 300-600 mg by mouth See admin instructions. 300 mg in the morning, 600 mg at bedtime   Yes [provider]  hydrALAZINE  (APRESOLINE ) 10 MG tablet  Take 10 mg by mouth in the morning and at bedtime.   Yes [provider]  hydrochlorothiazide  (HYDRODIURIL ) 25 MG tablet Take 25 mg by mouth daily.   Yes [provider]  lisinopril  (ZESTRIL ) 40 MG tablet Take 40 mg by mouth daily. 03/25/23  Yes [provider]   metFORMIN  (GLUCOPHAGE ) 1000 MG tablet Take 1,000 mg by mouth 2 (two) times daily with a meal.   Yes [provider]  montelukast  (SINGULAIR ) 10 MG tablet Take 10 mg by mouth at bedtime.   Yes [provider]  OVER THE COUNTER MEDICATION Take 400 mg by mouth at bedtime. Burdock root   Yes [provider]  oxyCODONE  (ROXICODONE ) 5 MG immediate release tablet Take 1 tablet (5 mg total) by mouth every 4 (four) hours as needed for severe pain (pain score 7-10). 01/30/23  Yes Danton Lauraine LABOR, PA-C  PARoxetine  (PAXIL ) 40 MG tablet Take 40 mg by mouth every morning.   Yes [provider]  spironolactone  (ALDACTONE ) 25 MG tablet Take 25 mg by mouth at bedtime. 10/07/21  Yes [provider]  tamsulosin  (FLOMAX ) 0.4 MG CAPS capsule Take 0.4 mg by mouth at bedtime. 10/07/21  Yes [provider]  testosterone  cypionate (DEPOTESTOSTERONE CYPIONATE) 200 MG/ML injection Inject 0.5 mLs (100 mg total) into the muscle every 14 (fourteen) days. 09/17/16  Yes Nida, Gebreselassie W, MD  verapamil  (CALAN -SR) 120 MG CR tablet Take 120 mg by mouth daily.   Yes [provider]  Vitamin D , Ergocalciferol , (DRISDOL ) 1.25 MG (50000 UNIT) CAPS capsule Take 1 capsule (50,000 Units total) by mouth every Thursday. Patient taking differently: Take 50,000 Units by mouth once a week. Tuesday 02/06/23  Yes Danton Lauraine LABOR, PA-C  buPROPion (WELLBUTRIN XL) 150 MG 24 hr tablet Take 150 mg by mouth daily. Patient not taking: Reported on 07/04/2023    [provider]     Vital Signs: BP 123/89 (BP Location: Right Arm)   Pulse (!) 102   Temp 98.2 F (36.8 C)   Resp 17   Ht 6' 0.01 (1.829 m)   Wt 222 lb 7.1 oz (100.9 kg)   SpO2 98%   BMI 30.16 kg/m   Physical Exam Constitutional:      Appearance: He is obese.  Pulmonary:     Effort: Pulmonary effort is normal.  Abdominal:     Palpations: Abdomen is soft.  Musculoskeletal:     Comments: Right hip drain  present with brown, thin drainage and bulb suction.  Charged appropriately.  Insertion site erythematous, no leakage around insertion site. Right thigh edematous.   Neurological:     Mental Status: He is alert and oriented to person, place, and time.  Psychiatric:        Mood and Affect: Mood normal.        Behavior: Behavior normal.        Thought Content: Thought content normal.        Judgment: Judgment normal.     Imaging: DG Knee 1-2 Views Right Result Date: 09/29/2023 CLINICAL DATA:  Knee pain and swelling EXAM: RIGHT KNEE - 1-2 VIEW COMPARISON:  X-ray 01/18/2019 FINDINGS: Moderate joint effusion. Moderate joint space loss of the medial compartment and patellofemoral joint. Tricompartmental osteophytes are seen greatest of the patellofemoral joint. Chondrocalcinosis also identified. There is medial translation of the femur relative to the tibia as well. Preserved bone mineralization. IMPRESSION: Advanced tricompartmental degenerative changes. Moderate joint effusion. Chondrocalcinosis Electronically Signed   By: Ranell  Charlanne M.D.   On: 09/29/2023 15:57   DG Ribs Unilateral Left Result Date: 09/28/2023 CLINICAL DATA:  Left-sided rib pain after cough EXAM: LEFT RIBS - 2 VIEW COMPARISON:  Chest x-ray 09/28/2023.  Chest CT 07/26/2023. FINDINGS: Expansile anterolateral left 6 rib lesion is again noted. No displaced fractures are identified. Lungs are grossly clear. IMPRESSION: Expansile anterolateral left 6 rib lesion is again noted. Correlate for point tenderness. No displaced fractures are identified. Electronically Signed   By: Greig Pique M.D.   On: 09/28/2023 21:50   DG CHEST PORT 1 VIEW Result Date: 09/28/2023 CLINICAL DATA:  Chest pain.  Attention left ribs. EXAM: PORTABLE CHEST 1 VIEW COMPARISON:  08/23/2023, CT 07/26/2023 FINDINGS: Removal of right-sided dialysis catheter and tracheostomy tube. Low lung volumes persist. Ill-defined opacity at the left lung base likely pleural effusion  and airspace disease/atelectasis. Stable heart size and mediastinal contours. Vascular congestion. No pneumothorax. Chronic bilateral shoulder arthropathy. Lucent lesions within the thoracic osseous structures on prior CT, including left rib, not well demonstrated by radiograph. IMPRESSION: 1. Ill-defined opacity at the left lung base likely pleural effusion and airspace disease/atelectasis. 2. Vascular congestion. 3. Lucent lesions within the thoracic osseous structures on prior CT, including left rib, are not well demonstrated by radiograph. Electronically Signed   By: Andrea Gasman M.D.   On: 09/28/2023 16:40    Labs:  CBC: Recent Labs    09/26/23 0447 09/27/23 0931 09/28/23 1634 09/29/23 1006  WBC 4.7 4.5 4.3 3.9*  HGB 8.0* 7.9* 8.3* 8.4*  HCT 24.0* 24.0* 25.6* 25.9*  PLT 261 262 293 298    COAGS: Recent Labs    07/05/23 2227 07/07/23 2030 07/10/23 0411 07/23/23 0416 07/24/23 0421 07/25/23 0409 07/26/23 0423 08/13/23 0908  INR 2.1* 1.9*  --   --   --   --   --  1.0  APTT  --  44*   < > 39* 45* 34 36  --    < > = values in this interval not displayed.    BMP: Recent Labs    09/25/23 1432 09/26/23 0447 09/27/23 0931 09/29/23 1006  NA 128* 129* 132* 130*  K 4.6 4.3 4.5 4.3  CL 98 97* 101 98  CO2 19* 22 22 22   GLUCOSE 127* 88 133* 134*  BUN 42* 43* 42* 46*  CALCIUM  7.8* 8.1* 8.3* 8.6*  CREATININE 0.93 0.89 0.93 0.94  GFRNONAA >60 >60 >60 >60    LIVER FUNCTION TESTS: Recent Labs    09/24/23 0806 09/24/23 0809 09/25/23 1432 09/26/23 0447 09/27/23 0931  BILITOT 0.4  --  0.4 0.4 0.4  AST 12*  --  9* 11* 9*  ALT 7  --  7 7 6   ALKPHOS 84  --  79 76 71  PROT 5.3*  --  5.2* 5.3* 5.3*  ALBUMIN  2.1* 2.1* 1.9* 1.9* 1.9*    Assessment and Plan: Output by Drain (mL) 09/28/23 0701 - 09/28/23 1900 09/28/23 1901 - 09/29/23 0700 09/29/23 0701 - 09/29/23 1900 09/29/23 1901 - 09/30/23 0700 09/30/23 0701 - 09/30/23 1640  Closed System Drain Right Buttock Bulb (JP)  10 Fr. 65 240 120 46 45  Gastrostomy/Enterostomy Gastrostomy 20 Fr. LUQ         Concern for increasing R leg swelling after previously having improved post drain placement. He remains on vanc. Cultures still marked pending. Discussed with Dr. Vanice, will start with US  imaging tonight and reassess tomorrow. Continued brown output>77ml suggests still positioned in collection, concern for  additional collections.   Continue TID flushes with 5 cc NS. Record output Q shift. Dressing changes QD or PRN if soiled.  Call IR APP or on call IR MD if difficulty flushing or sudden change in drain output.  Repeat imaging/possible drain injection once output < 10 mL/QD (excluding flush material). Consideration for drain removal if output is < 10 mL/QD (excluding flush material), pending discussion with the providing surgical service.  Discharge planning: Please contact IR APP or on call IR MD prior to patient d/c to ensure appropriate follow up plans are in place. Typically patient will follow up with IR clinic 10-14 days post d/c for repeat imaging/possible drain injection. IR scheduler will contact patient with date/time of appointment. Patient will need to flush drain QD with 5 cc NS, record output QD, dressing changes every 2-3 days or earlier if soiled.   IR will continue to follow - please call with questions or concerns.      Electronically Signed: Laymon Coast, NP 09/30/2023, 4:28 PM   I spent a total of 15 Minutes at the the patient's bedside AND on the patient's [name] floor or unit, greater than 50% of which was counseling/coordinating care for s/p R hip drain 7/11 by Dr. Jenna

## 2023-09-30 NOTE — Progress Notes (Signed)
 Progress Note   Patient: [name]. FMW:[id number] DOB: 1965/06/17 DOA: 07/03/2023     89 DOS: the patient was seen and examined on 09/30/2023   Brief hospital course: 58 year old PMH OSA CPAP, pathological fracture and multiple ORIF left Humerus likely due to multiple myeloma, A-fib not on anticoagulation, hyperlipidemia, tobacco use disorder who was feeling weak for 1 week, admitted to [facility] health with concern of sepsis, community-acquired pneumonia and acute encephalopathy. Patient was found to have lytic lesions, he was transferred to [facility] for oncology treatment. In the meantime he developed encephalopathy, acute renal failure. He remained in the ICU for 36 days. Underwent tracheostomy. He was also started on HD after CRRT,off of HD now. Subsequently PEG tube placed 5/29 and IR drain placement of right thigh hip infected hematoma on 7/11 .   Assessment and Plan: Closed fracture of left distal humerus History of Pathological fracture and multiple ORIF left Humerus 01/2023   Infected Right Thigh Hematoma:   Underwent IR drain placement for infected Hematoma on 7/11.  Continue IV Vancomycin  and Augmentin  x 2 weeks as per ID (Dr Dennise). Follow cultures.   Sepsis in the setting of Staph Epi Bacteremia - [facility] course complicated by intermittent fevers, ID consulted and most recently evaluated patient 6/17.   -He had a fever and MRSE bacteremia 1/4 bottles, completed a course of vancomycin  with the end date 6/20.  He did receive a line holiday.  Repeat blood culture 6/9 without growth. - He has unstageable sacral ulcer, continue local wound care.   Right thigh swelling: Concern for DVT.  I will order ultrasound venous duplex.   Multiple myeloma   Initiated chemotherapy on 08/21/2023. Initially given Velcade /Cytoxan . Now on Sarclisa /Kyprolis /Cytoxan .   -- Cycle 2 chemotherapy scheduled for 10/02/2023. - Myeloma responding well to treatment, IgG decreased from 11,507 to WNL 1470.    Left sided pleuritic chest wall pain: Resolved now No rib fractures seen on CXR Prn analgesics   Aspergillosis with pneumonia -On Voriconazole  per ID.  End date for voriconazole  is 8/5, continue.   Acute Hypoxic Respiratory Failure -  trach removed 6/11.  Respiratory status stable. No acute issues.   Severe acute metabolic encephalopathy with hypoactive delirium, seizure disorder - Normal MRI of the brain.  LP negative for meningitis or encephalitis.  EEG negative for seizures but encephalopathy.  -Resolved   Insulin -dependent diabetes type 2- A1c 6.0, continue with sliding scale insulin .     AKI on CKD stage IIIA, Uremia,  Hyponatremia, Hypokalemia -Due to MM, hypercalcemia and ACE inhibitors?  Nephrology consulted, was on hemodialysis but eventually he tolerated being off HD, tunneled catheter discontinued per nephrology.  Renal function is stable.   Paroxysmal A-fib - Unable to  anticoagulate due to hematoma.  Rate controlled   Obstructive Sleep Apnea -Trach removed. CPAP    Anxiety/Bipolar disorder - Continue with Paxil .     Severe protein calorie Malnutrition - Continue tube feeding via PEG.  He is allowed dysphagia 1 per speech. Continue with high protein supplementation.   History of Pathological fracture and multiple ORIF left Humerus 01/2023: -Dr. Jonel discussed with orthopedics on 6/26, options would involve either a substantial reconstruction with antibiotic cement and prolonged, difficult recovery, or above elbow amputation.  [name] recommends more rehabilitation at this time prior to attempting surgery. - Maintain left arm splint when out of bed - [name] following, since he is not clear when he will discharge likely to have surgery here in couple  of weeks.      Subjective: Left lower chest wall pain has resolved now.  He is complaining of right thigh swelling with slight pain and warmth.  I spoke to him about getting him venous Doppler ultrasound to rule out  DVT.  He had no other acute issues/concerns this morning.  Physical Exam: Vitals:   09/29/23 2201 09/30/23 0010 09/30/23 0405 09/30/23 0808  BP: 138/88 128/80 (!) 129/113 (!) 133/92  Pulse: 97 94 94 94  Resp: 18 18 18 18   Temp: 98.3 F (36.8 C) 98.2 F (36.8 C) 98.2 F (36.8 C) 97.8 F (36.6 C)  TempSrc:      SpO2: 100% 97% 97% 98%  Weight:      Height:       Constitutional: NAD, calm, comfortable Eyes: PERRL, lids and conjunctivae normal ENMT: Mucous membranes are moist. Posterior pharynx clear of any exudate or lesions.Normal dentition.  Neck: normal, supple, no masses, no thyromegaly, scar from trach removal site Respiratory: clear to auscultation bilaterally, no wheezing, no crackles. Normal respiratory effort. No accessory muscle use.  Cardiovascular: Regular rate and rhythm, no murmurs / rubs / gallops. No extremity edema. 2+ pedal pulses. No carotid bruits.  Abdomen: no tenderness, no masses palpated. No hepatosplenomegaly. Bowel sounds positive. PEG in place Musculoskeletal: no clubbing / cyanosis. No joint deformity upper and lower extremities. Good ROM, no contractures. Normal muscle tone.  Skin: no rashes, lesions, ulcers. No induration. Right hip drain in place Neurologic: Unable to move left arm, no other acute neuro deficits elsewhere Psychiatric: Normal judgment and insight. Alert and oriented x 3. Normal mood.     Data Reviewed:  There are no new results to review at this time.  Family Communication: None at bedside  Disposition: Status is: Inpatient Remains inpatient appropriate because: Physical deconditioning, sepsis  Planned Discharge Destination: Skilled nursing facility    Time spent: 43 minutes  Author: Deliliah Room, MD 09/30/2023 9:15 AM  For on call review [email or web address].

## 2023-09-30 NOTE — Progress Notes (Signed)
 Occupational Therapy Treatment Patient Details Name: Frank [name]. MRN: [id number] DOB: 1965/11/15 Today's Date: 09/30/2023   History of present illness 57 y.o. male transferred from [facility] 07/03/23 to [facility] for management of newly diagnosed plasma cell myeloma with weakness and AMS. Pt also with AKI and metabolic encephalopathy. 5/10 transfer to [facility] for iHD. 4/22 Intubated and chemo initiated. 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 5/6 IVIG started. 5/8-6/11 trach. 5/12 iHD initiated. 5/29 PEG placed. 6/5 chemo initiated. PMHx:Lt THA, carpal tunnel syndrome, Lt TKA, Lt humerus fx, PAF, HTN, HLD, bipolar disorder, obesity, T2DM   OT comments  [facility] OT/PT session. Patient required mod assist to donn gown and sling while at bed level. Patient attempts to assist with bed mobility with moving BLEs off EOB but continue to require max assist. Once on EOB, gown was donned for back and slippers. Patient able to stand into Stedy with max assist +2 and transfer to recliner. While lowering down from standing to sitting on recliner patient with increased Right knee pain. Patient tolerated sitting in recliner for 15 + minutes before returning to supine with use of maximove lift. Patient with no complaints of pain or discomfort with maximove transfer. Patient to continue to be followed by acute OT with [facility]AR program to progress with transfers and self care.       If plan is discharge home, recommend the following:  Two people to help with walking and/or transfers;Assistance with cooking/housework;Assistance with feeding;Direct supervision/assist for medications management;Direct supervision/assist for financial management;Assist for transportation;Help with stairs or ramp for entrance;A lot of help with bathing/dressing/bathroom   Equipment Recommendations  Other (comment) (TBD)    Recommendations for Other Services      Precautions / Restrictions Precautions Precautions: Fall Recall of  Precautions/Restrictions: Impaired Precaution/Restrictions Comments: episode of syncope, sacral wound, chemo. Lt humerus non-union, splinted. no elbow ROM, but shoulder ROM ok per secure chat with Dr. Jonel 7/1 Required Braces or Orthoses: Sling Restrictions Weight Bearing Restrictions Per Provider Order: Yes LUE Weight Bearing Per Provider Order: Non weight bearing Other Position/Activity Restrictions: Lt humerus non-union, splinted. no elbow ROM, but shoulder ROM ok per secure chat with Dr. Jonel 7/1       Mobility Bed Mobility Overal bed mobility: Needs Assistance Bed Mobility: Rolling, Supine to Sit Rolling: Max assist   Supine to sit: Max assist, HOB elevated, Used rails     General bed mobility comments: able to get to EOB with max assist and use of pads, performed rolling to remove lift pad    Transfers Overall transfer level: Needs assistance Equipment used: Ambulation equipment used Transfers: Sit to/from Stand, Bed to chair/wheelchair/BSC Sit to Stand: Max assist, +2 physical assistance, +2 safety/equipment           General transfer comment: patient stood from EOB to [place] with max assist +2 with increased pain when sitting from [place] to [employer] utilized to return to bed Transfer via Lift Equipment: Maximove, Stedy   Balance Overall balance assessment: Needs assistance Sitting-balance support: Single extremity supported, Feet supported Sitting balance-Leahy Scale: Fair Sitting balance - Comments: CGA to supervision for safety when EOB   Standing balance support: Single extremity supported, During functional activity, Reliant on assistive device for balance Standing balance-Leahy Scale: Zero Standing balance comment: stood into stedy for transfer to recliner                           ADL either performed  or assessed with clinical judgement   ADL Overall ADL's : Needs assistance/impaired     Grooming: Wash/dry face;Wash/dry hands;Set  up;Sitting           Upper Body Dressing : Moderate assistance;Bed level Upper Body Dressing Details (indicate cue type and reason): gown for front and back and sling Lower Body Dressing: Total assistance Lower Body Dressing Details (indicate cue type and reason): doffing socks and donning slippers                    Extremity/Trunk Assessment Upper Extremity Assessment LUE Deficits / Details: Lt humerus non-union, splinted. no elbow ROM, but shoulder ROM ok per secure chat with Dr. Jonel 7/1 - per pt, they are planning for an amputation LUE Sensation: decreased light touch LUE Coordination: decreased fine motor;decreased gross motor            Vision       Perception     Praxis     Communication Communication Communication: Impaired Factors Affecting Communication: Reduced clarity of speech   Cognition Arousal: Alert Behavior During Therapy: WFL for tasks assessed/performed Cognition: Cognition impaired     Awareness: Intellectual awareness intact   Attention impairment (select first level of impairment): Sustained attention Executive functioning impairment (select all impairments): Problem solving OT - Cognition Comments: Follows simple one step commands. pt verbalized fairly good awareness of medical situation and obtainable goals. He is motivated                 Following commands: Impaired Following commands impaired: Follows one step commands with increased time      Cueing   Cueing Techniques: Verbal cues, Tactile cues, Visual cues  Exercises      Shoulder Instructions       General Comments VSS on RA    Pertinent Vitals/ Pain       Pain Assessment Pain Assessment: Faces Faces Pain Scale: Hurts worst Pain Location: left shoulder and Right knee, increased pain at right knee from stand to sit Pain Descriptors / Indicators: Discomfort, Grimacing, Sharp, Moaning Pain Intervention(s): Limited activity within patient's tolerance,  Monitored during session, Repositioned, Patient requesting pain meds-RN notified  Home Living                                          Prior Functioning/Environment              Frequency  Min 1X/week Environmental education officer Program)        Progress Toward Goals  OT Goals(current goals can now be found in the care plan section)  Progress towards OT goals: Progressing toward goals  Acute Rehab OT Goals Patient Stated Goal: to go home OT Goal Formulation: With patient Time For Goal Achievement: 10/09/23 Potential to Achieve Goals: Fair ADL Goals Pt Will Perform Grooming: with supervision;with set-up;sitting Pt Will Perform Upper Body Dressing: with contact guard assist;sitting Pt Will Transfer to Toilet: bedside commode Pt/caregiver will Perform Home Exercise Program: Right Upper extremity;Increased strength;With written HEP provided;Independently Additional ADL Goal #1: Pt will complete bed mobility with mod A as a precursor to EOB ADLs  Plan      Co-evaluation    PT/OT/SLP Co-Evaluation/Treatment: Yes Reason for Co-Treatment: For patient/therapist safety;To address functional/ADL transfers PT goals addressed during session: Mobility/safety with mobility;Balance OT goals addressed during session: ADL's and self-care      AM-PAC OT 6  Clicks Daily Activity     Outcome Measure   Help from another person eating meals?: A Little Help from another person taking care of personal grooming?: A Little Help from another person toileting, which includes using toliet, bedpan, or urinal?: Total Help from another person bathing (including washing, rinsing, drying)?: A Lot Help from another person to put on and taking off regular upper body clothing?: A Lot Help from another person to put on and taking off regular lower body clothing?: Total 6 Click Score: 12    End of Session Equipment Utilized During Treatment: Gait belt;Other (comment) (sling, Stedy, maximove)  OT  Visit Diagnosis: Unsteadiness on feet (R26.81);Other abnormalities of gait and mobility (R26.89);Muscle weakness (generalized) (M62.81);Pain Pain - Right/Left: Right Pain - part of body: Knee (and left shoulder)   Activity Tolerance Patient tolerated treatment well   Patient Left in bed;with call bell/phone within reach   Nurse Communication Mobility status        Time: 8667-8567 OT Time Calculation (min): 60 min  Charges: OT General Charges $OT Visit: 1 Visit OT Treatments $Self Care/Home Management : 8-22 mins $Therapeutic Activity: 8-22 mins  [name], OTA Acute Rehabilitation Services  Office [phone]   [name] 09/30/2023, 2:49 PM

## 2023-10-01 ENCOUNTER — Other Ambulatory Visit (HOSPITAL_COMMUNITY): Payer: Self-pay

## 2023-10-01 ENCOUNTER — Inpatient Hospital Stay (HOSPITAL_COMMUNITY)

## 2023-10-01 ENCOUNTER — Encounter (HOSPITAL_COMMUNITY): Payer: Self-pay | Admitting: Hematology & Oncology

## 2023-10-01 ENCOUNTER — Telehealth (HOSPITAL_COMMUNITY): Payer: Self-pay | Admitting: Pharmacy Technician

## 2023-10-01 DIAGNOSIS — M7989 Other specified soft tissue disorders: Secondary | ICD-10-CM | POA: Diagnosis not present

## 2023-10-01 DIAGNOSIS — R6521 Severe sepsis with septic shock: Secondary | ICD-10-CM | POA: Diagnosis not present

## 2023-10-01 DIAGNOSIS — A419 Sepsis, unspecified organism: Secondary | ICD-10-CM | POA: Diagnosis not present

## 2023-10-01 HISTORY — PX: IR GASTROSTOMY TUBE REMOVAL: IMG5492

## 2023-10-01 LAB — COMPREHENSIVE METABOLIC PANEL WITH GFR
ALT: 6 U/L (ref 0–44)
AST: 10 U/L — ABNORMAL LOW (ref 15–41)
Albumin: 2 g/dL — ABNORMAL LOW (ref 3.5–5.0)
Alkaline Phosphatase: 79 U/L (ref 38–126)
Anion gap: 11 (ref 5–15)
BUN: 48 mg/dL — ABNORMAL HIGH (ref 6–20)
CO2: 20 mmol/L — ABNORMAL LOW (ref 22–32)
Calcium: 8.4 mg/dL — ABNORMAL LOW (ref 8.9–10.3)
Chloride: 99 mmol/L (ref 98–111)
Creatinine, Ser: 1.12 mg/dL (ref 0.61–1.24)
GFR, Estimated: >60 mL/min (ref >60)
Glucose, Bld: 121 mg/dL — ABNORMAL HIGH (ref 70–99)
Potassium: 4.6 mmol/L (ref 3.5–5.1)
Sodium: 130 mmol/L — ABNORMAL LOW (ref 135–145)
Total Bilirubin: 0.6 mg/dL (ref 0.0–1.2)
Total Protein: 5.2 g/dL — ABNORMAL LOW (ref 6.5–8.1)

## 2023-10-01 LAB — GLUCOSE, CAPILLARY
Glucose-Capillary: 78 mg/dL (ref 70–99)
Glucose-Capillary: 87 mg/dL (ref 70–99)
Glucose-Capillary: 133 mg/dL — ABNORMAL HIGH (ref 70–99)
Glucose-Capillary: 155 mg/dL — ABNORMAL HIGH (ref 70–99)

## 2023-10-01 LAB — AEROBIC/ANAEROBIC CULTURE W GRAM STAIN (SURGICAL/DEEP WOUND)
Culture: NO GROWTH
Gram Stain: NONE SEEN

## 2023-10-01 LAB — VANCOMYCIN, TROUGH: Vancomycin Tr: 51 ug/mL (ref 15–20)

## 2023-10-01 MED ORDER — APIXABAN 5 MG PO TABS
10.0000 mg | ORAL_TABLET | Freq: Two times a day (BID) | ORAL | Status: DC
  Administered 2023-10-01 (×2): 10 mg via ORAL
  Filled 2023-10-01 (×2): qty 2

## 2023-10-01 MED ORDER — EPOETIN ALFA 40000 UNIT/ML IJ SOLN
40000.0000 [IU] | INTRAMUSCULAR | Status: DC
  Administered 2023-10-01 – 2023-11-05 (×7): 40000 [IU] via SUBCUTANEOUS
  Filled 2023-10-01 (×6): qty 1

## 2023-10-01 MED ORDER — LINEZOLID 600 MG PO TABS: 600.0000 mg | ORAL_TABLET | Freq: Two times a day (BID) | ORAL | Status: DC

## 2023-10-01 MED ORDER — PROSOURCE PLUS PO LIQD
30.0000 mL | Freq: Three times a day (TID) | ORAL | Status: DC
  Administered 2023-10-02 – 2023-11-07 (×88): 30 mL via ORAL
  Filled 2023-10-01 (×83): qty 30

## 2023-10-01 MED ORDER — LINEZOLID 600 MG PO TABS
600.0000 mg | ORAL_TABLET | Freq: Two times a day (BID) | ORAL | Status: AC
  Administered 2023-10-02 – 2023-10-09 (×15): 600 mg via ORAL
  Filled 2023-10-01 (×15): qty 1

## 2023-10-01 MED ORDER — APIXABAN 5 MG PO TABS: 5.0000 mg | ORAL_TABLET | Freq: Two times a day (BID) | ORAL | Status: DC

## 2023-10-01 NOTE — Progress Notes (Signed)
 I think that Mr. Bazen is doing pretty well.  He did have an ultrasound of this right thigh yesterday.  This still showed a fluid collection in the thigh.  The drainage tube is still draining.  Culture was involved in negative.  He is eating okay.  He wants to try regular diet.  I do not see a problem with him doing this.  I think that he should do well with a regular diet.  Given the fact that he is eating well.  He really is not using the feeding tube.  As such, I really think we can get that feeding tube taken out.  He did do physical therapy yesterday.  He really enjoys his physical therapist.  He has had no fever.  His labs were not done today.  I also think that he will benefit from a Port-A-Cath.  Again I do not see any active infection.  I think a Port-A-Cath will be much beneficial for him in the future as we do treatment.  He really has very little in the way of peripheral IV access.  I talked him about the Port-A-Cath.  He is okay however this done.  I will see if IR can do this sometime this week.  He has had no bleeding.  He has had no problems with bowels or bladder.  We are doing a 24-hour urine on him and see how much light chain is in the urine.  His vital signs show temperature 98.2.  Pulse 89.  Blood pressure 141/87.  His head neck exam shows no ocular or oral lesions.  He has no adenopathy in the neck.  Lungs are clear bilaterally.  Cardiac exam regular rate and rhythm.  Has no murmurs.  Abdomen is soft and obese.  He has decent bowel sounds.  The G-tube is intact.  He has no guarding or rebound tenderness.  Extremities shows a drainage catheter in the right thigh.  There is clear dark fluid in the drainage bulb.  Neurological exam is nonfocal.  Mr. Kenton has responded nicely to treatment.  He has myeloma.  He will not get treatment this week.  Again, I think we have a lot going on with him so we can hold off on treatment.  I do want give him some ESA.  I think this will  help his hemoglobin up.  I will also see about getting the PEG tube taken out.  I do not see any reason why we cannot take this.  He will also need to have a Port-A-Cath placed at some point.  I am just glad to see that he is making progress.  It is nice talking with him.  He is certainly quite motivated.  I know he is getting fantastic care from everybody on [address].   Jeralyn Crease, MD  Psalm 34:17

## 2023-10-01 NOTE — Progress Notes (Signed)
 Nutrition Follow-up  DOCUMENTATION CODES:   Severe malnutrition in context of acute illness/injury  INTERVENTION:   -Continue regular diet -Continue Ensure Plus High Protein po BID, each supplement provides 350 kcal and 20 grams of protein  -D/c Prosource TF -30 ml Prosource Plus TID, each supplement provides 100 kcals and 15 grams protein -Continue 1 packet Juven PO BID, each packet provides 95 calories, 2.5 grams of protein (collagen), and 9.8 grams of carbohydrate (3 grams sugar); also contains 7 grams of L-arginine and L-glutamine, 300 mg vitamin C, 15 mg vitamin E, 1.2 mcg vitamin B-12, 9.5 mg zinc , 200 mg calcium , and 1.5 g  Calcium  Beta-hydroxy-Beta-methylbutyrate to support wound healing  -Continue MVI with minerals daily -Magic cup TID with meals, each supplement provides 290 kcal and 9 grams of protein   NUTRITION DIAGNOSIS:   Severe Malnutrition related to acute illness (prolonged illness, interuptions in feeding) as evidenced by moderate fat depletion, moderate muscle depletion, percent weight loss (9.6% x 1 month).  Ongoing  GOAL:   Patient will meet greater than or equal to 90% of their needs  Progressing   MONITOR:   PO intake, Supplement acceptance, Diet advancement  REASON FOR ASSESSMENT:   Consult Assessment of nutrition requirement/status  ASSESSMENT:   58 y.o. male with PMH of obesity, asthma, OSA on CPAP, GERD, HTN, HLD who presented due to feeling weak with AMS for a few days. Admitted with concern for sepsis, community-acquired pneumonia, encephalopathy as well as concern for multiple myeloma.  4/17 Admit to [facility] 4/21 SLP eval - NPO; IR attempted Cortrak but unable to be placed; CCM MD successfully placed NGT 4/22 Concern for aspiration overnight; Intubated 4/23 - CRRT 5/8 - tracheostomy 5/10 - CRRT discontinued 5/11 - transferred to [facility] 5/12 - iHD initiated  5/29 - PEG placed 5/31 - chemotherapy changed to  Sarclisa /Kyprolis /Cytoxan  6/9 - MBS, NPO recommended  6/11 - trach removed 6/13 - BSE, NPO, tunneled HD catheter removed for line holiday 6/16 - Tunneled HD replaced,  6/18 - FEES, thin liquids when supervised 6/19, 6/21 - BSE, clear liquids 6/23 - MBS, clear liquids, pt not able to full participate in study due to pain; no HD since 6/16, line pulled pt showing signs of renal recovery 6/24 - FEES, thin liquids and puree ok 6/27 - BSE - DYS 1, thins 7/10- per ortho notes, pt with rt hip/ thigh hematoma 7/11- s/p BSE- dysphagia 2 diet with thin liquids 7/14- s/p BSE- dysphagia 3 diet with thin liquids 7/16- advanced to regular diet per MD (oncology)  Reviewed I/O's: -3.8 L x 24 hours and -46.1 L since 09/17/23  UOP: 4.3 L x 24 hours  Drain output: 105 ml x 24 hours   Pt unavailable at time of visit. Attempted to speak with pt via call to hospital room phone, however, unable to reach. RD unable to obtain further nutrition-related history or complete nutrition-focused physical exam at this time.    Per oncology notes, cycle 2 of chemo scheduled was 10/02/23, however, this will be held until pt undergoes rt hip drain removal and arm amputation.     Noted last hydrotherapy treatment on 09/26/23. Per [facility] notes, pt with stage 3 pressure injury to sacrum, stage 2 pressure injury to rt ischium, and stage 2 pressure injury to lt ischium.   Noted pt was advanced to a regular diet per MD on 10/01/23. SLP to follow-up for tolerance.   Case discussed with RN, who reports good oral intake and acceptance  of supplements. Documented meal completions 50-100%. Pt now NPO for procedure today.   Per oncology notes, plan for port-a-cath placement and also requesting PEG removal as pt is using PEG rarely.   Reviewed wt hx; pt has experienced a 20.6% wt loss over the past month, which is significant for time frame. Pt has also experienced a 4.8% wt loss over the past week, which is not significant for time  frame. Suspect fluid changes may be contributing to weight loss (-46.1 L since 09/17/23). Pt's last NFPE was on 07/29/23; pt may benefit from NFPE at follow-up.  20.6% x 1 month 4.8% x 1 week  Per TOC notes, pt awaiting SNF placement.   Medications reviewed and include augmention, lovenox , epogen , mucinex , miralax , and senokot.   Labs reviewed: CBGS: 85-190 (inpatient orders for glycemic control are 0-15 units insulin  aspart TID before meals and at bedtine, 2 units insulin  aspart TID before meals and at bedtime, and 10 units insulin  glargine-yfgn daily).    Diet Order:   Diet Order             Diet regular Room service appropriate? Yes; Fluid consistency: Thin  Diet effective now                   EDUCATION NEEDS:   Not appropriate for education at this time  Skin:  Skin Assessment: Skin Integrity Issues: Skin Integrity Issues:: Stage III, Stage II DTI: Left Buttocks Stage II: lt ischium Stage III: sacrum, rt ischium Unstageable: Left and Right Heel  Last BM:  09/27/23  Height:   Ht Readings from Last 1 Encounters:  07/22/23 6' 0.01 (1.829 m)    Weight:   Wt Readings from Last 1 Encounters:  09/26/23 100.9 kg    Ideal Body Weight:  80.9 kg  BMI:  Body mass index is 30.16 kg/m.  Estimated Nutritional Needs:   Kcal:  2400-2700 kcal/d  Protein:  130-150g/d  Fluid:  2.5L/d    Margery ORN, RD, LDN, CDCES Registered Dietitian III Certified Diabetes Care and Education Specialist If unable to reach this RD, please use RD Inpatient group chat on secure chat between hours of 8am-4 pm daily

## 2023-10-01 NOTE — Telephone Encounter (Signed)
 Patient Product/process development scientist completed.    The patient is insured through [employer]. Patient has Medicare and is not eligible for a copay card, but may be able to apply for patient assistance or Medicare RX Payment Plan (Patient Must reach out to their plan, if eligible for payment plan), if available.    Ran test claim for Eliquis 5 mg and the current 30 day co-pay is $12.15.   This test claim was processed through [facility]- copay amounts may vary at other pharmacies due to pharmacy/plan contracts, or as the patient moves through the different stages of their insurance plan.     Roland Earl, CPHT Pharmacy Technician III Certified Patient Advocate [facility] Patient Advocate Team Direct Number: [phone]  Fax: [phone]

## 2023-10-01 NOTE — Progress Notes (Signed)
 PHARMACY ANTIBIOTIC CONSULT NOTE   Frank [name]. a 58 y.o. male with infected gluteal hematoma s/p drain placement by IR .  Pharmacy has been consulted for Vancomycin  dosing. Vancomycin  level supratherapeutic    Estimated Creatinine Clearance: 104 mL/min (by C-G formula based on SCr of 0.94 mg/dL).  Plan: Hold further Vancomycin  doses for now Check Vancomycin  and BMet this morning in order to assess renal function and establish new regimen  Monitor renal function, clinical status, de-escalation, C/S, levels as indicated   Allergies:  Allergies  Allergen Reactions   Morphine  And Codeine Anaphylaxis   Shellfish Allergy Anaphylaxis   Betadine  [Povidone Iodine ] Itching   Bupropion Other (See Comments)    Shaking of the body, hallucinations    Chlorhexidine  Hives    Patient reports never had issues CHG with mouth rinse.   Influenza Vaccines Hives   Metoprolol  Rash    Filed Weights   09/24/23 0500 09/25/23 0500 09/26/23 0500  Weight: 97.4 kg (214 lb 11.7 oz) 98.2 kg (216 lb 7.9 oz) 100.9 kg (222 lb 7.1 oz)       Latest Ref Rng & Units 09/29/2023   10:06 AM 09/28/2023    4:34 PM 09/27/2023    9:31 AM  CBC  WBC 4.0 - 10.5 K/uL 3.9  4.3  4.5   Hemoglobin 13.0 - 17.0 g/dL 8.4  8.3  7.9   Hematocrit 39.0 - 52.0 % 25.9  25.6  24.0   Platelets 150 - 400 K/uL 298  293  262     Antibiotics Given (last 72 hours)     Date/Time Action Medication Dose Rate   09/28/23 0855 Given   acyclovir  (ZOVIRAX ) 200 MG capsule 400 mg 400 mg    09/28/23 0902 New Bag/Given   cefTRIAXone  (ROCEPHIN ) 2 g in sodium chloride  0.9 % 100 mL IVPB 2 g 200 mL/hr   09/28/23 1219 New Bag/Given   metroNIDAZOLE  (FLAGYL ) IVPB 500 mg 500 mg 100 mL/hr   09/28/23 1227 New Bag/Given   vancomycin  (VANCOREADY) IVPB 1500 mg/300 mL 1,500 mg 150 mL/hr   09/28/23 2209 Given   acyclovir  (ZOVIRAX ) 200 MG capsule 400 mg 400 mg    09/28/23 2218 New Bag/Given   vancomycin  (VANCOREADY) IVPB 1500 mg/300 mL 1,500 mg 150  mL/hr   09/29/23 0039 New Bag/Given   metroNIDAZOLE  (FLAGYL ) IVPB 500 mg 500 mg 100 mL/hr   09/29/23 1015 Given   acyclovir  (ZOVIRAX ) 200 MG capsule 400 mg 400 mg    09/29/23 1018 New Bag/Given   cefTRIAXone  (ROCEPHIN ) 2 g in sodium chloride  0.9 % 100 mL IVPB 2 g 200 mL/hr   09/29/23 1113 New Bag/Given   vancomycin  (VANCOREADY) IVPB 1500 mg/300 mL 1,500 mg 150 mL/hr   09/29/23 1208 Given   amoxicillin -clavulanate (AUGMENTIN ) 875-125 MG per tablet 1 tablet 1 tablet    09/29/23 2114 Given   acyclovir  (ZOVIRAX ) 200 MG capsule 400 mg 400 mg    09/29/23 2114 Given   amoxicillin -clavulanate (AUGMENTIN ) 875-125 MG per tablet 1 tablet 1 tablet    09/29/23 2347 New Bag/Given   vancomycin  (VANCOREADY) IVPB 1500 mg/300 mL 1,500 mg 150 mL/hr   09/30/23 9177 Given   amoxicillin -clavulanate (AUGMENTIN ) 875-125 MG per tablet 1 tablet 1 tablet    09/30/23 0824 Given   acyclovir  (ZOVIRAX ) 200 MG capsule 400 mg 400 mg    09/30/23 1248 New Bag/Given   vancomycin  (VANCOREADY) IVPB 1500 mg/300 mL 1,500 mg 150 mL/hr   09/30/23 2211 Given   acyclovir  (  ZOVIRAX ) 200 MG capsule 400 mg 400 mg    09/30/23 2211 Given   amoxicillin -clavulanate (AUGMENTIN ) 875-125 MG per tablet 1 tablet 1 tablet        4/17 R/Z >> 4/22 Zosyn  >> 5/1 Meropenem  >> 5/6 Levaquin  >> 5/12 4/28 Vanco >> 5/5 4/18 Fluconazole  >> 4/23 5/4 Voriconazole  >> 5/6>> reinitiated 5/10 >>(8/5) 5/10 Unasyn  >> 5/11  5/11 vancomycin  >> 5/16 Acyclovir  ppx 5/24 >> Vancomycin  6/8 >>6/19: 7/11>>   4/19: MRSA PCR: neg 4/19 UCx: ngF 4/19 BCx: ngF 4/23 TA: few Staph epi: MRSE 4/28 BCx: ngF 4/30 TA: rare aspergillus fumigatus 5/2 CSF: neg 5/4 TA: rare mold, aspergillus [place]  5/5 TA PJP stain: neg 5/5 MRSA PCR: neg 5/5 BCx: NgF 5/9 tracheal asp: abundant Staph haemolyticus + mold, Aspergillus [place]  5/11 BCx: neg 5/15 TA: NF 6/7 UCx: ng 6/7 BCx: MRSE (1 of 3 bottles) 6/9 blood: neg 7/11 abscess cx: sent   Cathlyn Arrant, PharmD, BCPS   10/01/2023 1:23 AM

## 2023-10-01 NOTE — Progress Notes (Signed)
 Progress Note   Patient: Frank [name]. FMW:[id number] DOB: [birth year] DOA: 07/03/2023     90 DOS: the patient was seen and examined on 10/01/2023   Brief hospital course: 58 year old PMH OSA CPAP, pathological fracture and multiple ORIF left Humerus likely due to multiple myeloma, A-fib not on anticoagulation, hyperlipidemia, tobacco use disorder who was feeling weak for 1 week, admitted to [facility] health with concern of sepsis, community-acquired pneumonia and acute encephalopathy. Patient was found to have lytic lesions, he was transferred to [facility] for oncology treatment. In the meantime he developed encephalopathy, acute renal failure. He remained in the ICU for 36 days. Underwent tracheostomy. He was also started on HD after CRRT,off of HD now. Subsequently PEG tube placed 5/29 and IR drain placement of right thigh hip infected hematoma on 7/11 . Needs PEG tube removal and Port-A-Cath placement (for outpatient chemo).  Assessment and Plan: Closed fracture of left distal humerus History of Pathological fracture and multiple ORIF left Humerus 01/2023   Infected Right Thigh Hematoma:   Underwent IR drain placement for infected Hematoma on 7/11.  Continue Linezolid  (will start 7/17) and Augmentin  x 2 weeks as per ID (Dr Dennise). Vanc dced (high trough). Follow cultures-no growth.   Sepsis in the setting of Staph Epi Bacteremia - Resolved [facility] course complicated by intermittent fevers, ID consulted and most recently evaluated patient 6/17.   -He had a fever and MRSE bacteremia 1/4 bottles, completed a course of vancomycin  with the end date 6/20.  He did receive a line holiday.  Repeat blood culture 6/9 without growth. - He has unstageable sacral ulcer, continue local wound care.   Right thigh swelling: US  soft tissue showed fluid collection over lateral right thigh, abscess vs seroma.   B/l LE DVT: US  venous doppler done on 7/16, positive for DVT in his RLE PTV, left CFV, SFJ, and  PFV. Ordered eliquis , therapeutic dose.   Multiple myeloma   Initiated chemotherapy on 08/21/2023. Initially given Velcade /Cytoxan . Now on Sarclisa /Kyprolis /Cytoxan .   -- Cycle 2 chemotherapy will be done as an out patient. --Pending Port-A-cath placement. - Myeloma responding well to treatment, IgG decreased from 11,507 to WNL 1470. -- Dr [name] on board.   Left sided pleuritic chest wall pain: Resolved now No rib fractures seen on CXR Prn analgesics   Aspergillosis with pneumonia -On Voriconazole  per ID.  End date for voriconazole  is 8/5, continue.   Acute Hypoxic Respiratory Failure -  trach removed 6/11.  Respiratory status stable. No acute issues.   Severe acute metabolic encephalopathy with hypoactive delirium, seizure disorder - Normal MRI of the brain.  LP negative for meningitis or encephalitis.  EEG negative for seizures but encephalopathy.  -Resolved   Insulin -dependent diabetes type 2- A1c 6.0, continue with sliding scale insulin .     AKI on CKD stage IIIA, Uremia,  Hyponatremia, Hypokalemia -Due to MM, hypercalcemia and ACE inhibitors?  Nephrology consulted, was on hemodialysis but eventually he tolerated being off HD, tunneled catheter discontinued per nephrology.  Renal function is stable.   Paroxysmal A-fib - Unable to  anticoagulate due to hematoma.  Rate controlled   Obstructive Sleep Apnea -Trach removed. CPAP    Anxiety/Bipolar disorder - Continue with Paxil .     Severe protein calorie Malnutrition - Advanced to regular diet. PEG tube will be removed today.   History of Pathological fracture and multiple ORIF left Humerus 01/2023: -Dr. Jonel discussed with orthopedics on 6/26, options would involve either a substantial reconstruction with antibiotic  cement and prolonged, difficult recovery, or above elbow amputation.  [name] recommends more rehabilitation at this time prior to attempting surgery. - Maintain left arm splint when out of bed - [name]  following, since he is not clear when he will discharge likely to have surgery here in couple of weeks.      Subjective: No acute events overnight. We discussed US  right thigh results. We also spoke about putting a chemo port and removal of his PEG tube. His diet has been advanced to a regular diet.  Physical Exam: Vitals:   09/30/23 2035 09/30/23 2335 10/01/23 0311 10/01/23 0733  BP: (!) 137/92 (!) 123/98 (!) 141/87 (!) 143/89  Pulse: 95 93 89 94  Resp: 18 18 18 18   Temp: 98.3 F (36.8 C) 99 F (37.2 C) 98.2 F (36.8 C) 97.9 F (36.6 C)  TempSrc:      SpO2: 100% 97% 98% 98%  Weight:      Height:       Constitutional: NAD, calm, comfortable Eyes: PERRL, lids and conjunctivae normal ENMT: Mucous membranes are moist. Posterior pharynx clear of any exudate or lesions.Normal dentition.  Neck: normal, supple, no masses, no thyromegaly, scar from trach removal site Respiratory: clear to auscultation bilaterally, no wheezing, no crackles. Normal respiratory effort. No accessory muscle use.  Cardiovascular: Regular rate and rhythm, no murmurs / rubs / gallops. No extremity edema. 2+ pedal pulses. No carotid bruits.  Abdomen: no tenderness, no masses palpated. No hepatosplenomegaly. Bowel sounds positive. PEG in place Musculoskeletal: Unable to move left arm. Left arm is edematous. Swollen right lateral thigh with no erythema or discharge. Right thigh catheter in place. Skin: no rashes, lesions, ulcers. No induration. Right hip drain in place Neurologic: Unable to move left arm, no other acute neuro deficits elsewhere Psychiatric: Normal judgment and insight. Alert and oriented x 3. Normal mood.     Data Reviewed:  There are no new results to review at this time.  Family Communication: None at bedside  Disposition: Status is: Inpatient Remains inpatient appropriate because: Physical deconditioning, sepsis  Planned Discharge Destination: Skilled nursing facility    Time spent:  44 minutes  Author: Deliliah Room, MD 10/01/2023 9:51 AM  For on call review [email or web address].

## 2023-10-01 NOTE — Progress Notes (Signed)
 Physical Therapy Treatment Patient Details Name: Frank [name]. MRN: [id number] DOB: 25-Feb-1966 Today's Date: 10/01/2023   History of Present Illness 58 y.o. male transferred from [facility] 07/03/23 to [facility] for management of newly diagnosed plasma cell myeloma with weakness and AMS. Pt also with AKI and metabolic encephalopathy. 5/10 transfer to [facility] for iHD. 4/22 Intubated and chemo initiated. 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 5/6 IVIG started. 5/8-6/11 trach. 5/12 iHD initiated. 5/29 PEG placed. 6/5 chemo initiated. PMHx:Lt THA, carpal tunnel syndrome, Lt TKA, Lt humerus fx, PAF, HTN, HLD, bipolar disorder, obesity, T2DM    PT Comments  STAR PT Session: Pt awaiting placement of Port A cath on entry so session limited to bed level today. Pt able to perform UE and abdominal strengthening. Procedure cancelled but pt reporting too much pain for out of bed today. Will follow back tomorrow to progress mobility.     If plan is discharge home, recommend the following: Two people to help with walking and/or transfers;Assistance with cooking/housework;Assist for transportation;Two people to help with bathing/dressing/bathroom;Direct supervision/assist for medications management;Direct supervision/assist for financial management;Help with stairs or ramp for entrance;Assistance with feeding   Can travel by private vehicle     No  Equipment Recommendations  Hospital bed;Hoyer lift;Wheelchair (measurements PT);Wheelchair cushion (measurements PT);BSC/3in1       Precautions / Restrictions Precautions Precautions: Fall Recall of Precautions/Restrictions: Impaired Precaution/Restrictions Comments: episode of syncope, sacral wound, chemo. Lt humerus non-union, splinted. no elbow ROM, but shoulder ROM ok per secure chat with Dr. Jonel 7/1 Required Braces or Orthoses: Sling Restrictions Weight Bearing Restrictions Per Provider Order: Yes LUE Weight Bearing Per Provider Order: Non weight bearing Other  Position/Activity Restrictions: Lt humerus non-union, splinted. no elbow ROM, but shoulder ROM ok per secure chat with Dr. Jonel 7/1     Mobility  Bed Mobility Overal bed mobility: Needs Assistance Bed Mobility: Rolling Rolling: Max assist         General bed mobility comments: rolling to address positioning and bed pad placement, patient declined EOB    Transfers Overall transfer level: Needs assistance                 General transfer comment: Patient declined OOB today due to scheduled procedure             Communication Communication Communication: Impaired Factors Affecting Communication: Reduced clarity of speech  Cognition Arousal: Alert Behavior During Therapy: [facility] for tasks assessed/performed                             Following commands: Impaired Following commands impaired: Follows one step commands with increased time    Cueing Cueing Techniques: Verbal cues, Tactile cues, Visual cues  Exercises General Exercises - Upper Extremity Shoulder Flexion: AROM, Right, 10 reps, Supine Shoulder Horizontal ABduction: Strengthening, Right, 10 reps, Supine, Theraband Theraband Level (Shoulder Horizontal Abduction): Level 1 (Yellow) Elbow Flexion: Strengthening, Right, 10 reps, Supine, Theraband Theraband Level (Elbow Flexion): Level 1 (Yellow) Elbow Extension: Strengthening, Right, 10 reps, Supine, Theraband Theraband Level (Elbow Extension): Level 1 (Yellow) Other Exercises Other Exercises: sit ups/trunk flexion from bed level    General Comments General comments (skin integrity, edema, etc.): VSS on RA      Pertinent Vitals/Pain Pain Assessment Pain Assessment: Faces Faces Pain Scale: Hurts even more Pain Location: left shoulder and Right knee Pain Descriptors / Indicators: Discomfort, Grimacing, Sharp, Moaning Pain Intervention(s): Limited activity within patient's tolerance, Monitored during  session, Repositioned     PT Goals  (current goals can now be found in the care plan section) Acute Rehab PT Goals PT Goal Formulation: With patient Time For Goal Achievement: 10/09/23 Potential to Achieve Goals: Fair Progress towards PT goals: Not progressing toward goals - comment    Frequency    Min 5X/week       Co-evaluation PT/OT/SLP Co-Evaluation/Treatment: Yes Reason for Co-Treatment: For patient/therapist safety;To address functional/ADL transfers PT goals addressed during session: Mobility/safety with mobility;Balance OT goals addressed during session: ADL's and self-care      AM-PAC PT 6 Clicks Mobility   Outcome Measure  Help needed turning from your back to your side while in a flat bed without using bedrails?: A Lot Help needed moving from lying on your back to sitting on the side of a flat bed without using bedrails?: Total Help needed moving to and from a bed to a chair (including a wheelchair)?: Total Help needed standing up from a chair using your arms (e.g., wheelchair or bedside chair)?: Total Help needed to walk in hospital room?: Total Help needed climbing 3-5 steps with a railing? : Total 6 Click Score: 7    End of Session   Activity Tolerance: Patient limited by pain Patient left: in bed;with call bell/phone within reach Nurse Communication: Mobility status PT Visit Diagnosis: Other abnormalities of gait and mobility (R26.89);Muscle weakness (generalized) (M62.81);Difficulty in walking, not elsewhere classified (R26.2);Unsteadiness on feet (R26.81)     Time: 8880-8793 PT Time Calculation (min) (ACUTE ONLY): 47 min  Charges:    $Therapeutic Exercise: 8-22 mins PT General Charges $$ ACUTE PT VISIT: 1 Visit                     Casten Floren B. Fleeta Lapidus PT, DPT Acute Rehabilitation Services Please use secure chat or  Call Office [phone]    [name] [facility] 10/01/2023, 4:11 PM

## 2023-10-01 NOTE — Progress Notes (Signed)
 BLE venous duplex has been completed.  Preliminary findings given to Dr. Dino.   Results can be found under chart review under CV PROC. 10/01/2023 12:45 PM Alani Lacivita RVT, RDMS

## 2023-10-01 NOTE — Progress Notes (Signed)
 PHARMACY ANTIBIOTIC CONSULT NOTE   Frank [name]. a 58 y.o. male with infected gluteal hematoma s/p drain placement by IR .  Pharmacy has been consulted for Vancomycin  dosing. Vancomycin  level supratherapeutic   Discussed with Dr. Dennise with ID and ok to switch vancomycin  to linezolid  to complete the course.   Estimated Creatinine Clearance: 104 mL/min (by C-G formula based on SCr of 0.94 mg/dL).  Plan: Discontinue vanc  Start linezolid  tomorrow 600 mg BID to complete 14 day course  Monitor renal function, clinical status, de-escalation, C/S, levels as indicated   Allergies:  Allergies  Allergen Reactions   Morphine  And Codeine Anaphylaxis   Shellfish Allergy Anaphylaxis   Betadine  [Povidone Iodine ] Itching   Bupropion Other (See Comments)    Shaking of the body, hallucinations    Chlorhexidine  Hives    Patient reports never had issues CHG with mouth rinse.   Influenza Vaccines Hives   Metoprolol  Rash    Filed Weights   09/24/23 0500 09/25/23 0500 09/26/23 0500  Weight: 97.4 kg (214 lb 11.7 oz) 98.2 kg (216 lb 7.9 oz) 100.9 kg (222 lb 7.1 oz)       Latest Ref Rng & Units 09/29/2023   10:06 AM 09/28/2023    4:34 PM 09/27/2023    9:31 AM  CBC  WBC 4.0 - 10.5 K/uL 3.9  4.3  4.5   Hemoglobin 13.0 - 17.0 g/dL 8.4  8.3  7.9   Hematocrit 39.0 - 52.0 % 25.9  25.6  24.0   Platelets 150 - 400 K/uL 298  293  262     Antibiotics Given (last 72 hours)     Date/Time Action Medication Dose Rate   09/28/23 0855 Given   acyclovir  (ZOVIRAX ) 200 MG capsule 400 mg 400 mg    09/28/23 0902 New Bag/Given   cefTRIAXone  (ROCEPHIN ) 2 g in sodium chloride  0.9 % 100 mL IVPB 2 g 200 mL/hr   09/28/23 1219 New Bag/Given   metroNIDAZOLE  (FLAGYL ) IVPB 500 mg 500 mg 100 mL/hr   09/28/23 1227 New Bag/Given   vancomycin  (VANCOREADY) IVPB 1500 mg/300 mL 1,500 mg 150 mL/hr   09/28/23 2209 Given   acyclovir  (ZOVIRAX ) 200 MG capsule 400 mg 400 mg    09/28/23 2218 New Bag/Given   vancomycin   (VANCOREADY) IVPB 1500 mg/300 mL 1,500 mg 150 mL/hr   09/29/23 0039 New Bag/Given   metroNIDAZOLE  (FLAGYL ) IVPB 500 mg 500 mg 100 mL/hr   09/29/23 1015 Given   acyclovir  (ZOVIRAX ) 200 MG capsule 400 mg 400 mg    09/29/23 1018 New Bag/Given   cefTRIAXone  (ROCEPHIN ) 2 g in sodium chloride  0.9 % 100 mL IVPB 2 g 200 mL/hr   09/29/23 1113 New Bag/Given   vancomycin  (VANCOREADY) IVPB 1500 mg/300 mL 1,500 mg 150 mL/hr   09/29/23 1208 Given   amoxicillin -clavulanate (AUGMENTIN ) 875-125 MG per tablet 1 tablet 1 tablet    09/29/23 2114 Given   acyclovir  (ZOVIRAX ) 200 MG capsule 400 mg 400 mg    09/29/23 2114 Given   amoxicillin -clavulanate (AUGMENTIN ) 875-125 MG per tablet 1 tablet 1 tablet    09/29/23 2347 New Bag/Given   vancomycin  (VANCOREADY) IVPB 1500 mg/300 mL 1,500 mg 150 mL/hr   09/30/23 9177 Given   amoxicillin -clavulanate (AUGMENTIN ) 875-125 MG per tablet 1 tablet 1 tablet    09/30/23 0824 Given   acyclovir  (ZOVIRAX ) 200 MG capsule 400 mg 400 mg    09/30/23 1248 New Bag/Given   vancomycin  (VANCOREADY) IVPB 1500 mg/300 mL 1,500 mg  150 mL/hr   09/30/23 2211 Given   acyclovir  (ZOVIRAX ) 200 MG capsule 400 mg 400 mg    09/30/23 2211 Given   amoxicillin -clavulanate (AUGMENTIN ) 875-125 MG per tablet 1 tablet 1 tablet        4/17 R/Z >> 4/22 Zosyn  >> 5/1 Meropenem  >> 5/6 Levaquin  >> 5/12 4/28 Vanco >> 5/5 4/18 Fluconazole  >> 4/23 5/4 Voriconazole  >> 5/6>> reinitiated 5/10 >>(8/5) 5/10 Unasyn  >> 5/11  5/11 vancomycin  >> 5/16 Acyclovir  ppx 5/24 >> Vancomycin  6/8 >>6/19: 7/11>>7/16 Linezolid  7/17>>7/24   4/19: MRSA PCR: neg 4/19 UCx: ngF 4/19 BCx: ngF 4/23 TA: few Staph epi: MRSE 4/28 BCx: ngF 4/30 TA: rare aspergillus fumigatus 5/2 CSF: neg 5/4 TA: rare mold, aspergillus [place]  5/5 TA PJP stain: neg 5/5 MRSA PCR: neg 5/5 BCx: NgF 5/9 tracheal asp: abundant Staph haemolyticus + mold, Aspergillus luxem[place]  5/11 BCx: neg 5/15 TA: NF 6/7 UCx: ng 6/7 BCx: MRSE (1 of 3  bottles) 6/9 blood: neg 7/11 abscess cx: sent   Maurilio Patten, PharmD PGY1 Pharmacy Resident [facility]  10/01/2023 7:21 AM

## 2023-10-01 NOTE — TOC Progression Note (Signed)
 Transition of Care (TOC) - Progression Note    Patient Details  Name: Frank [name]. MRN: [id number] Date of Birth: [birth year]  Transition of Care [facility]) CM/SW Contact  Frans Valente A [place], LCSW Phone Number: 10/01/2023, 1:02 PM  Clinical Narrative:     CSW met with pt at bedside to discuss disposition updates. He stated that he is aware that his surgery is pending until date procedure can be obtained. He said that he is interested in staying in [place], [place], where is he from for rehab. CSW dicussed with pt that because of his outpatient chemotherapy appointments in [facility], a SNF closer to that area that could assist with transportation would need to be obtained. He acknowledged understanding. CSW to send referrals out in [facility] area and updated on bed offers when available.    TOC will continue to follow.    Expected Discharge Plan: Skilled Nursing Facility Barriers to Discharge: Continued Medical Work up  Expected Discharge Plan and Services In-house Referral: Clinical Social Work Discharge Planning Services: CM Consult Post Acute Care Choice: Skilled Nursing Facility Living arrangements for the past 2 months: Single Family Home                 DME Arranged: N/A DME Agency: NA       HH Arranged: NA HH Agency: NA         Social Determinants of Health (SDOH) Interventions SDOH Screenings   Food Insecurity: No Food Insecurity (07/05/2023)  Housing: Low Risk  (07/05/2023)  Transportation Needs: No Transportation Needs (07/05/2023)  Utilities: Not At Risk (07/05/2023)  Tobacco Use: High Risk (08/30/2023)    Readmission Risk Interventions     No data to display

## 2023-10-01 NOTE — Procedures (Signed)
     Informed written consent was obtained from the patient after a thorough discussion of the procedural risks, benefits and alternatives. All questions were addressed. A timeout was performed prior to the initiation of the procedure.  Balloon retention 18 French gastrostomy tube was intact. 5 cc syringe was used to remove 5 cc of saline from balloon. Gastrostomy tube was removed without difficulty. Site was cleaned and dressed appropriately.  Recommendations after Gtube is removed:  The patient can resume eating and drinking after 2 hours, but do not over eat as food/drink may spill out of stoma.  Some patients experience a small amount of leaking of fluid - but the hole in the stomach wall usually heals within 24 hours and the hole in the skin within a few days.  Change the dressing daily and keep the dressing dry for five days.  Patient may shower 48 hours after the tube is removed. While showering, please avoid direct water  pressure to the site for five to seven days. After showering, apply a fresh, dry dressing.  Do not apply Bacitracin, Neosporin, hydrogen peroxide or any other cleanser/ointment to the area. Clean site with warm water .  To prevent infection, do not bathe in a bathtub for at least two weeks.  Do not do exercise that puts extra pressure on the stomach for at least a week.  Electronically Signed: Lavanda JAYSON Jurist, PA-C 05/12/2023, 9:30 AM

## 2023-10-01 NOTE — Progress Notes (Signed)
  IR BRIEF PROGRESS NOTE:  IR has received requests by [name] for G-tube removal and Port-A-Cath placement.   IR has confirmed with patient that he is able to tolerate PO intake of a regular diet. Hospitalist Attending, Dr. JULIANNA Room is in agreement. G-tube will be removed at bedside by IR team today.   With regards to the Port-A-Cath placement request, patient is recently with sepsis, currently with a right hip drain placed in IR on 7/11. Cultures have been negative to date, but ID also has patient on 2 weeks of antibiotics. Per IR attending, Dr. Philip, IR will defer Port-A-Cath placement at this time until patient has completed his 2 week course of antibiotics. [name] is in agreement. If patient is discharged prior to then, patient can be considered for outpatient Port-A-Cath placement. The port placement request will be deleted. Please re-engage IR when patient is off antibiotics and stable, for new Port-A-Cath placement request.    Electronically Signed: Carlin DELENA Griffon, PA-C 10/01/2023, 11:07 AM

## 2023-10-01 NOTE — Plan of Care (Signed)
  Problem: Education: Goal: Knowledge of General Education information will improve Description Including pain rating scale, medication(s)/side effects and non-pharmacologic comfort measures Outcome: Progressing   Problem: Nutrition: Goal: Adequate nutrition will be maintained Outcome: Progressing   Problem: Elimination: Goal: Will not experience complications related to bowel motility Outcome: Progressing   Problem: Skin Integrity: Goal: Risk for impaired skin integrity will decrease Outcome: Progressing

## 2023-10-01 NOTE — Progress Notes (Signed)
 Occupational Therapy Treatment Patient Details Name: Frank [name]. MRN: [id number] DOB: [birth year] Today's Date: 10/01/2023   History of present illness 58 y.o. male transferred from [facility] 07/03/23 to [facility] for management of newly diagnosed plasma cell myeloma with weakness and AMS. Pt also with AKI and metabolic encephalopathy. 5/10 transfer to [facility] for iHD. 4/22 Intubated and chemo initiated. 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 5/6 IVIG started. 5/8-6/11 trach. 5/12 iHD initiated. 5/29 PEG placed. 6/5 chemo initiated. PMHx:Lt THA, carpal tunnel syndrome, Lt TKA, Lt humerus fx, PAF, HTN, HLD, bipolar disorder, obesity, T2DM   OT comments  [facility] OT/PT session: Patient received in supine and declining OOB or EOB due to patient awaiting scheduled procedure. Patient willing to participate in bed level therapy with RUE strengthening exercises, trunk strengthening, and LUE positioning and edema management. Patient to continue to be followed by [facility] program to progress to OOB and address transfers.       If plan is discharge home, recommend the following:  Two people to help with walking and/or transfers;Assistance with cooking/housework;Assistance with feeding;Direct supervision/assist for medications management;Direct supervision/assist for financial management;Assist for transportation;Help with stairs or ramp for entrance;A lot of help with bathing/dressing/bathroom   Equipment Recommendations  Other (comment) (TBD)    Recommendations for Other Services      Precautions / Restrictions Precautions Precautions: Fall Recall of Precautions/Restrictions: Impaired Precaution/Restrictions Comments: episode of syncope, sacral wound, chemo. Lt humerus non-union, splinted. no elbow ROM, but shoulder ROM ok per secure chat with Dr. Jonel 7/1 Required Braces or Orthoses: Sling Restrictions Weight Bearing Restrictions Per Provider Order: Yes LUE Weight Bearing Per Provider Order: Non weight  bearing Other Position/Activity Restrictions: Lt humerus non-union, splinted. no elbow ROM, but shoulder ROM ok per secure chat with Dr. Jonel 7/1       Mobility Bed Mobility Overal bed mobility: Needs Assistance Bed Mobility: Rolling Rolling: Max assist         General bed mobility comments: rolling to address positioning and bed pad placement, patient declined EOB    Transfers Overall transfer level: Needs assistance                 General transfer comment: Patient declined OOB today due to scheduled procedure     Balance                                           ADL either performed or assessed with clinical judgement   ADL Overall ADL's : Needs assistance/impaired     Grooming: Wash/dry face;Wash/dry hands;Set up;Bed level           Upper Body Dressing : Moderate assistance;Bed level Upper Body Dressing Details (indicate cue type and reason): donning gown and sling                   General ADL Comments: limited self care due to patient declining OOB or EOB    Extremity/Trunk Assessment              Vision       Perception     Praxis     Communication Communication Communication: Impaired Factors Affecting Communication: Reduced clarity of speech   Cognition Arousal: Alert Behavior During Therapy: WFL for tasks assessed/performed Cognition: Cognition impaired     Awareness: Intellectual awareness intact   Attention impairment (select first level of impairment): Sustained  attention Executive functioning impairment (select all impairments): Problem solving OT - Cognition Comments: Patient concern over procedure planned today, limiting participation                 Following commands: Impaired Following commands impaired: Follows one step commands with increased time      Cueing   Cueing Techniques: Verbal cues, Tactile cues, Visual cues  Exercises Exercises: General Upper Extremity, Other  exercises General Exercises - Upper Extremity Shoulder Flexion: AROM, Right, 10 reps, Supine Shoulder Horizontal ABduction: Strengthening, Right, 10 reps, Supine, Theraband Theraband Level (Shoulder Horizontal Abduction): Level 1 (Yellow) Elbow Flexion: Strengthening, Right, 10 reps, Supine, Theraband Theraband Level (Elbow Flexion): Level 1 (Yellow) Elbow Extension: Strengthening, Right, 10 reps, Supine, Theraband Theraband Level (Elbow Extension): Level 1 (Yellow) Other Exercises Other Exercises: sit ups/trunk flexion from bed level    Shoulder Instructions       General Comments VSS on RA    Pertinent Vitals/ Pain       Pain Assessment Pain Assessment: Faces Faces Pain Scale: Hurts even more Pain Location: left shoulder and Right knee Pain Descriptors / Indicators: Discomfort, Grimacing, Sharp, Moaning Pain Intervention(s): Limited activity within patient's tolerance, Monitored during session, Repositioned, Patient requesting pain meds-RN notified  Home Living                                          Prior Functioning/Environment              Frequency  Min 1X/week ([facility])        Progress Toward Goals  OT Goals(current goals can now be found in the care plan section)  Progress towards OT goals: Progressing toward goals  Acute Rehab OT Goals Patient Stated Goal: to go home OT Goal Formulation: With patient Time For Goal Achievement: 10/09/23 Potential to Achieve Goals: Fair ADL Goals Pt Will Perform Grooming: with supervision;with set-up;sitting Pt Will Perform Upper Body Dressing: with contact guard assist;sitting Pt Will Transfer to Toilet: bedside commode Pt/caregiver will Perform Home Exercise Program: Right Upper extremity;Increased strength;With written HEP provided;Independently Additional ADL Goal #1: Pt will complete bed mobility with mod A as a precursor to EOB ADLs  Plan      Co-evaluation    PT/OT/SLP  Co-Evaluation/Treatment: Yes Reason for Co-Treatment: For patient/therapist safety;To address functional/ADL transfers PT goals addressed during session: Mobility/safety with mobility;Balance OT goals addressed during session: ADL's and self-care      AM-PAC OT 6 Clicks Daily Activity     Outcome Measure   Help from another person eating meals?: A Little Help from another person taking care of personal grooming?: A Little Help from another person toileting, which includes using toliet, bedpan, or urinal?: Total Help from another person bathing (including washing, rinsing, drying)?: A Lot Help from another person to put on and taking off regular upper body clothing?: A Lot Help from another person to put on and taking off regular lower body clothing?: Total 6 Click Score: 12    End of Session    OT Visit Diagnosis: Unsteadiness on feet (R26.81);Other abnormalities of gait and mobility (R26.89);Muscle weakness (generalized) (M62.81);Pain Pain - Right/Left: Right Pain - part of body: Knee (and left shoulder)   Activity Tolerance Patient tolerated treatment well   Patient Left in bed;with call bell/phone within reach   Nurse Communication Mobility status        Time:  8880-8792 OT Time Calculation (min): 48 min  Charges: OT General Charges $OT Visit: 1 Visit OT Treatments $Therapeutic Activity: 8-22 mins $Therapeutic Exercise: 8-22 mins  Frank [name], OTA Acute Rehabilitation Services  Office [phone]   Frank [name] 10/01/2023, 1:51 PM

## 2023-10-01 NOTE — Plan of Care (Signed)

## 2023-10-01 NOTE — Progress Notes (Signed)
 SLP Cancellation Note  Patient Details Name: Frank [name]. MRN: [id number] DOB: Feb 02, 1966   Cancelled treatment:         SLP attempted to see patient for diet check and discharge to assess tolerance of regular/thin liquid diet. Upon chart review, MD upgraded patient to regular/thin liquids from Dys3/thin liquids, though unable to assess tolerance as upon SLP arrival, MD in room declaring NPO for upcoming procedure. SLP will plan to follow to assess diet tolerance as able.   Rosina Downy, M.A., CCC-SLP  Angelee Bahr A Alpheus Stiff 10/01/2023, 9:31 AM

## 2023-10-02 ENCOUNTER — Other Ambulatory Visit: Payer: Self-pay

## 2023-10-02 ENCOUNTER — Inpatient Hospital Stay (HOSPITAL_COMMUNITY)

## 2023-10-02 DIAGNOSIS — A419 Sepsis, unspecified organism: Secondary | ICD-10-CM | POA: Diagnosis not present

## 2023-10-02 DIAGNOSIS — R6521 Severe sepsis with septic shock: Secondary | ICD-10-CM | POA: Diagnosis not present

## 2023-10-02 LAB — COMPREHENSIVE METABOLIC PANEL WITH GFR
ALT: 6 U/L (ref 0–44)
AST: 11 U/L — ABNORMAL LOW (ref 15–41)
Albumin: 1.9 g/dL — ABNORMAL LOW (ref 3.5–5.0)
Alkaline Phosphatase: 78 U/L (ref 38–126)
Anion gap: 11 (ref 5–15)
BUN: 38 mg/dL — ABNORMAL HIGH (ref 6–20)
CO2: 21 mmol/L — ABNORMAL LOW (ref 22–32)
Calcium: 8.8 mg/dL — ABNORMAL LOW (ref 8.9–10.3)
Chloride: 102 mmol/L (ref 98–111)
Creatinine, Ser: 0.93 mg/dL (ref 0.61–1.24)
GFR, Estimated: >60 mL/min (ref >60)
Glucose, Bld: 138 mg/dL — ABNORMAL HIGH (ref 70–99)
Potassium: 4 mmol/L (ref 3.5–5.1)
Sodium: 134 mmol/L — ABNORMAL LOW (ref 135–145)
Total Bilirubin: 0.3 mg/dL (ref 0.0–1.2)
Total Protein: 5.4 g/dL — ABNORMAL LOW (ref 6.5–8.1)

## 2023-10-02 LAB — CBC WITH DIFFERENTIAL/PLATELET
Abs Immature Granulocytes: 0.02 K/uL (ref 0.00–0.07)
Basophils Absolute: 0 K/uL (ref 0.0–0.1)
Basophils Relative: 1 %
Eosinophils Absolute: 0.1 K/uL (ref 0.0–0.5)
Eosinophils Relative: 2 %
HCT: 27 % — ABNORMAL LOW (ref 39.0–52.0)
Hemoglobin: 8.9 g/dL — ABNORMAL LOW (ref 13.0–17.0)
Immature Granulocytes: 1 %
Lymphocytes Relative: 31 %
Lymphs Abs: 1.4 K/uL (ref 0.7–4.0)
MCH: 35.2 pg — ABNORMAL HIGH (ref 26.0–34.0)
MCHC: 33 g/dL (ref 30.0–36.0)
MCV: 106.7 fL — ABNORMAL HIGH (ref 80.0–100.0)
Monocytes Absolute: 0.8 K/uL (ref 0.1–1.0)
Monocytes Relative: 17 %
Neutro Abs: 2.2 K/uL (ref 1.7–7.7)
Neutrophils Relative %: 48 %
Platelets: 392 K/uL (ref 150–400)
RBC: 2.53 MIL/uL — ABNORMAL LOW (ref 4.22–5.81)
RDW: 19.4 % — ABNORMAL HIGH (ref 11.5–15.5)
WBC: 4.4 K/uL (ref 4.0–10.5)
nRBC: 0 % (ref 0.0–0.2)

## 2023-10-02 LAB — GLUCOSE, CAPILLARY
Glucose-Capillary: 102 mg/dL — ABNORMAL HIGH (ref 70–99)
Glucose-Capillary: 152 mg/dL — ABNORMAL HIGH (ref 70–99)
Glucose-Capillary: 169 mg/dL — ABNORMAL HIGH (ref 70–99)
Glucose-Capillary: 107 mg/dL — ABNORMAL HIGH (ref 70–99)

## 2023-10-02 LAB — HEPARIN LEVEL (UNFRACTIONATED)
Heparin Unfractionated: 0.54 [IU]/mL (ref 0.30–0.70)
Heparin Unfractionated: 0.28 [IU]/mL — ABNORMAL LOW (ref 0.30–0.70)

## 2023-10-02 LAB — PREALBUMIN: Prealbumin: 19 mg/dL (ref 18–38)

## 2023-10-02 LAB — VORICONAZOLE, SERUM: Voriconazole, Serum: 1.9 ug/mL

## 2023-10-02 LAB — APTT
aPTT: 43 s — ABNORMAL HIGH (ref 24–36)
aPTT: 39 s — ABNORMAL HIGH (ref 24–36)

## 2023-10-02 MED ORDER — HEPARIN (PORCINE) 25000 UT/250ML-% IV SOLN
2700.0000 [IU]/h | INTRAVENOUS | Status: DC
  Administered 2023-10-02: 1450 [IU]/h via INTRAVENOUS
  Administered 2023-10-03: 2050 [IU]/h via INTRAVENOUS
  Administered 2023-10-03: 1950 [IU]/h via INTRAVENOUS
  Administered 2023-10-04 (×2): 2500 [IU]/h via INTRAVENOUS
  Administered 2023-10-05: 2700 [IU]/h via INTRAVENOUS
  Administered 2023-10-05: 2500 [IU]/h via INTRAVENOUS
  Administered 2023-10-05 – 2023-10-12 (×14): 2700 [IU]/h via INTRAVENOUS
  Filled 2023-10-02 (×24): qty 250

## 2023-10-02 NOTE — Progress Notes (Signed)
 Appreciate IR taking out the feeding tube.  He is able to eat.  He is having no problems with dysphagia or odynophagia.  Unfortunately, he now has a thrombus in the left leg.  This is a fairly extensive thrombus.  He has been placed on Eliquis .  We had to be careful because in the past he had the bleed over into the right thigh.  I think he was on anticoagulation at that time because of history of atrial fibrillation.  He did not do any physical therapy yesterday.  Again, we are holding off on his chemotherapy for right now.  His Port-A-Cath will not be put in because of the concern over infection.  He has a drainage catheter in the right thigh still.  Cultures have been negative.  I think he is on antibiotics still.  We did do a 24-hour urine on him.  Results are not yet back.  His vital signs show temperature 97.4.  Pulse 86.  Blood pressure 141/103.  His head and neck exam shows no ocular or oral lesions.  He has no adenopathy in the neck.  Lungs are clear bilaterally.  He has good air movement bilaterally.  Cardiac exam regular rate and rhythm.  He has no murmurs, rubs or bruits.  Abdomen is soft.  He has decent bowel sounds.  There is no fluid wave.  There is no palpable liver or spleen tip extremities does show some swelling in the legs bilaterally.  He has no erythema.  There is no obvious venous cord.  He has a drainage catheter in the right thigh.  There is clear dark fluid in the collection bulb.  Neurological exam shows no focal neurological deficits.  When asked sure when he is going to have the amputation for the left arm.  As such, we may have to think about getting him on heparin  right now.  This will be a lot more manageable for any upcoming surgery.  I am glad that he is able to eat better.  He is on regular diet now.  We will continue to follow along.  I know he is getting outstanding care from everybody on 2 W.   Jeralyn Crease, MD  [facility] 11:28

## 2023-10-02 NOTE — Plan of Care (Signed)
   Problem: Education: Goal: Knowledge of General Education information will improve Description: Including pain rating scale, medication(s)/side effects and non-pharmacologic comfort measures Outcome: Progressing   Problem: Activity: Goal: Risk for activity intolerance will decrease Outcome: Progressing   Problem: Nutrition: Goal: Adequate nutrition will be maintained Outcome: Progressing

## 2023-10-02 NOTE — Progress Notes (Signed)
 Progress Note   Patient: Frank [name]. FMW:[id number] DOB: 1965-04-01 DOA: 07/03/2023     91 DOS: the patient was seen and examined on 10/02/2023   Brief hospital course: 58 year old PMH OSA CPAP, pathological fracture and multiple ORIF left Humerus likely due to multiple myeloma, A-fib not on anticoagulation, hyperlipidemia, tobacco use disorder who was feeling weak for 1 week, admitted to [facility] with concern of sepsis, community-acquired pneumonia and acute encephalopathy. Patient was found to have lytic lesions, he was transferred to [facility] for oncology treatment. In the meantime he developed encephalopathy, acute renal failure. He remained in the ICU for 36 days. Underwent tracheostomy. He was also started on HD after CRRT,off of HD now. Subsequently PEG tube placed 5/29 and IR drain placement of right thigh hip infected hematoma on 7/11 . Needs PEG tube removal and Port-A-Cath placement (for outpatient chemo).  Assessment and Plan: Closed fracture of left distal humerus History of Pathological fracture and multiple ORIF left Humerus 01/2023   Infected Right Thigh Hematoma:   Underwent IR drain placement for infected Hematoma on 7/11.  Continue Linezolid  (will start 7/17) and Augmentin  x 2 weeks as per ID (Dr Dennise). Vanc dced (high trough). Follow cultures-no growth.   Sepsis in the setting of Staph Epi Bacteremia - Resolved [facility] course complicated by intermittent fevers, ID consulted and most recently evaluated patient 6/17.   -He had a fever and MRSE bacteremia 1/4 bottles, completed a course of vancomycin  with the end date 6/20.  He did receive a line holiday.  Repeat blood culture 6/9 without growth. - He has unstageable sacral ulcer, continue local wound care.   Right thigh swelling: US  soft tissue showed fluid collection over lateral right thigh, abscess vs seroma.   B/l LE DVT: US  venous doppler done on 7/16, positive for DVT in his RLE PTV, left CFV, SFJ, and  PFV. Started on heparin  drip on the morning of 7/17. I discussed in length with the patient about the risks and benefits of anticoagulation including life-threatening bleeding and possibly death.  He made an informed decision to be started on anticoagulation.  I spoke to him that once he is off of heparin  drip and no more surgical procedures are planned, he will be transition to oral Eliquis  on discharge.  I also spoke to him that if he develops bleeding from a site then we will have to stop the anticoagulation and he may need an IVC filter at that time.   Multiple myeloma   Initiated chemotherapy on 08/21/2023. Initially given Velcade /Cytoxan . Now on Sarclisa /Kyprolis /Cytoxan .   -- Cycle 2 chemotherapy will be done as an out patient. --Needs Port-A-cath placement, likely closer to discharge or as an outpatient. - Myeloma responding well to treatment, IgG decreased from 11,507 to WNL 1470. -- Dr [name] on board.   Left sided pleuritic chest wall pain: Resolved now No rib fractures seen on CXR Prn analgesics   Aspergillosis with pneumonia -On Voriconazole  per ID.  End date for voriconazole  is 8/5, continue.   Acute Hypoxic Respiratory Failure -  trach removed 6/11.  Respiratory status stable. No acute issues.   Severe acute metabolic encephalopathy with hypoactive delirium, seizure disorder - Normal MRI of the brain.  LP negative for meningitis or encephalitis.  EEG negative for seizures but encephalopathy.  -Resolved   Insulin -dependent diabetes type 2- A1c 6.0, continue with sliding scale insulin .     AKI on CKD stage IIIA, Uremia,  Hyponatremia, Hypokalemia -Due to MM, hypercalcemia  and ACE inhibitors?  Nephrology consulted, was on hemodialysis but eventually he tolerated being off HD, tunneled catheter discontinued per nephrology.  Renal function is stable.   Paroxysmal A-fib - Unable to  anticoagulate due to hematoma.  Rate controlled   Obstructive Sleep Apnea -Trach removed. CPAP     Anxiety/Bipolar disorder - Continue with Paxil .     Severe protein calorie Malnutrition - Advanced to regular diet.  PEG tube was removed on 10/01/2023.   History of Pathological fracture and multiple ORIF left Humerus 01/2023: -Dr. Jonel discussed with orthopedics on 6/26, options would involve either a substantial reconstruction with antibiotic cement and prolonged, difficult recovery, or above elbow amputation.  [name] recommends more rehabilitation at this time prior to attempting surgery. - Maintain left arm splint when out of bed - [name] following, since he is not clear when he will discharge likely to have surgery here in couple of weeks.      Subjective: No acute events overnight. Discussed about the results of LE venous duplex and anticoagulation. Discussed both risks and benefits of anticoagulation.  Physical Exam: Vitals:   10/01/23 2233 10/02/23 0008 10/02/23 0400 10/02/23 0739  BP:  120/82 (!) 141/103 133/82  Pulse:  92 86 100  Resp:  17 18 18   Temp:  97.9 F (36.6 C) (!) 97.4 F (36.3 C) 98.6 F (37 C)  TempSrc:   Axillary Oral  SpO2:  98% 100% 100%  Weight:      Height: 6' (1.829 m)      Constitutional: NAD, calm, comfortable Eyes: PERRL, lids and conjunctivae normal ENMT: Mucous membranes are moist. Posterior pharynx clear of any exudate or lesions.Normal dentition.  Neck: normal, supple, no masses, no thyromegaly, scar from trach removal site Respiratory: clear to auscultation bilaterally, no wheezing, no crackles. Normal respiratory effort. No accessory muscle use.  Cardiovascular: Regular rate and rhythm, no murmurs / rubs / gallops. B/l LE extremity pitting edema, L>R. 2+ pedal pulses. No carotid bruits.  Abdomen: no tenderness, no masses palpated. No hepatosplenomegaly. Bowel sounds positive. Musculoskeletal: Unable to move left arm. Left arm is edematous. Swollen right lateral thigh with no erythema or discharge. Right sided hip catheter in  place. Skin: no rashes, lesions, ulcers. No induration. Right hip drain in place Neurologic: Unable to move left arm, no other acute neuro deficits elsewhere Psychiatric: Normal judgment and insight. Alert and oriented x 3. Normal mood.     Data Reviewed:  There are no new results to review at this time.  Family Communication:Spoke on the phone  Disposition: Status is: Inpatient Remains inpatient appropriate because: Physical deconditioning, sepsis  Planned Discharge Destination: Skilled nursing facility    Time spent: 43 minutes  Author: Deliliah Room, MD 10/02/2023 9:31 AM  For on call review [email or web address].

## 2023-10-02 NOTE — Progress Notes (Signed)
 Occupational Therapy Treatment Patient Details Name: Frank [name]. MRN: [id number] DOB: 1965-08-01 Today's Date: 10/02/2023   History of present illness 58 y.o. male transferred from [facility] 07/03/23 to [facility] for management of newly diagnosed plasma cell myeloma with weakness and AMS. Pt also with AKI and metabolic encephalopathy. 5/10 transfer to [facility] for iHD. 4/22 Intubated and chemo initiated. 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 5/6 IVIG started. 5/8-6/11 trach. 5/12 iHD initiated. 5/29 PEG placed. 6/5 chemo initiated. PMHx:Lt THA, carpal tunnel syndrome, Lt TKA, Lt humerus fx, PAF, HTN, HLD, bipolar disorder, obesity, T2DM   OT comments  STAR Program OT/PT session: patient received in supine and asking to use BSC. Patient required max assist to get to EOB following donning gown and LUE sling. Patient was provided education on slide board transfer and was able to perform with mod assist +2. Patient unable to perform toilet hygiene and required total assist to perform. Patient performed forward flexion with max assist +1 to allow for board placement and max assist +2 for slide board transfer back to bed. Patients wife present and was asked to bring in shorts to assist with slide board transfer. Patient demonstrating good gains with STAR Program. Acute OT to continue to follow to address established goals to facilitate DC to next venue of care.        If plan is discharge home, recommend the following:  Two people to help with walking and/or transfers;Assistance with cooking/housework;Assistance with feeding;Direct supervision/assist for medications management;Direct supervision/assist for financial management;Assist for transportation;Help with stairs or ramp for entrance;A lot of help with bathing/dressing/bathroom   Equipment Recommendations  Other (comment) (TBD)    Recommendations for Other Services      Precautions / Restrictions Precautions Precautions: Fall Recall of  Precautions/Restrictions: Impaired Precaution/Restrictions Comments: episode of syncope, sacral wound, chemo. Lt humerus non-union, splinted. no elbow ROM, but shoulder ROM ok per secure chat with Dr. Jonel 7/1 Required Braces or Orthoses: Sling Restrictions Weight Bearing Restrictions Per Provider Order: Yes LUE Weight Bearing Per Provider Order: Non weight bearing Other Position/Activity Restrictions: Lt humerus non-union, splinted. no elbow ROM, but shoulder ROM ok per secure chat with Dr. Jonel 7/1       Mobility Bed Mobility Overal bed mobility: Needs Assistance Bed Mobility: Rolling, Supine to Sit Rolling: Max assist   Supine to sit: Max assist, HOB elevated, Used rails     General bed mobility comments: use of bed pads    Transfers Overall transfer level: Needs assistance Equipment used: Sliding board Transfers: Bed to chair/wheelchair/BSC            Lateral/Scoot Transfers: Mod assist, +2 physical assistance, Max assist General transfer comment: mod assist +2 for slide board transfer to drop arm BSC and max assist +2 from BSC back to EOB     Balance Overall balance assessment: Needs assistance Sitting-balance support: Single extremity supported, Feet supported Sitting balance-Leahy Scale: Fair Sitting balance - Comments: CGA to supervision for safety when EOB                                   ADL either performed or assessed with clinical judgement   ADL Overall ADL's : Needs assistance/impaired     Grooming: Wash/dry face;Set up;Sitting                   Toilet Transfer: Moderate assistance;+2 for physical assistance;Transfer board;BSC/3in1;Requires drop arm Toilet  Transfer Details (indicate cue type and reason): slide board transfer for transfer to/from drop arm BSC Toileting- Clothing Manipulation and Hygiene: Total assistance;Sitting/lateral lean Toileting - Clothing Manipulation Details (indicate cue type and reason): patient  required total assist for toilet hygiene seated on BSC       General ADL Comments: improvement with toilet transfers with slide board    Extremity/Trunk Assessment              Vision       Perception     Praxis     Communication Communication Communication: Impaired Factors Affecting Communication: Reduced clarity of speech   Cognition Arousal: Alert Behavior During Therapy: WFL for tasks assessed/performed Cognition: Cognition impaired     Awareness: Intellectual awareness intact   Attention impairment (select first level of impairment): Sustained attention Executive functioning impairment (select all impairments): Problem solving OT - Cognition Comments: cooperative and eager to progress                 Following commands: Impaired Following commands impaired: Follows one step commands with increased time      Cueing   Cueing Techniques: Verbal cues, Tactile cues, Visual cues  Exercises      Shoulder Instructions       General Comments VSS on RA    Pertinent Vitals/ Pain       Pain Assessment Pain Assessment: Faces Faces Pain Scale: Hurts even more Pain Location: left shoulder and Right knee Pain Descriptors / Indicators: Discomfort, Grimacing, Sharp, Moaning Pain Intervention(s): Limited activity within patient's tolerance, Monitored during session, Repositioned  Home Living                                          Prior Functioning/Environment              Frequency  Min 1X/week (STAR program)        Progress Toward Goals  OT Goals(current goals can now be found in the care plan section)  Progress towards OT goals: Progressing toward goals  Acute Rehab OT Goals Patient Stated Goal: to go home OT Goal Formulation: With patient Time For Goal Achievement: 10/09/23 Potential to Achieve Goals: Fair ADL Goals Pt Will Perform Grooming: with supervision;with set-up;sitting Pt Will Perform Upper Body  Dressing: with contact guard assist;sitting Pt Will Transfer to Toilet: bedside commode Pt/caregiver will Perform Home Exercise Program: Right Upper extremity;Increased strength;With written HEP provided;Independently Additional ADL Goal #1: Pt will complete bed mobility with mod A as a precursor to EOB ADLs  Plan      Co-evaluation    PT/OT/SLP Co-Evaluation/Treatment: Yes Reason for Co-Treatment: For patient/therapist safety;To address functional/ADL transfers PT goals addressed during session: Mobility/safety with mobility;Balance OT goals addressed during session: ADL's and self-care      AM-PAC OT 6 Clicks Daily Activity     Outcome Measure   Help from another person eating meals?: A Little Help from another person taking care of personal grooming?: A Little Help from another person toileting, which includes using toliet, bedpan, or urinal?: Total Help from another person bathing (including washing, rinsing, drying)?: A Lot Help from another person to put on and taking off regular upper body clothing?: A Lot Help from another person to put on and taking off regular lower body clothing?: Total 6 Click Score: 12    End of Session Equipment Utilized During Treatment: Gait belt;Other (  comment) (sliding board)  OT Visit Diagnosis: Unsteadiness on feet (R26.81);Other abnormalities of gait and mobility (R26.89);Muscle weakness (generalized) (M62.81);Pain Pain - Right/Left: Right Pain - part of body: Knee (and left shoulder)   Activity Tolerance Patient tolerated treatment well   Patient Left in bed;with call bell/phone within reach;with family/visitor present   Nurse Communication Mobility status        Time: 1340-1450 OT Time Calculation (min): 70 min  Charges: OT General Charges $OT Visit: 1 Visit OT Treatments $Self Care/Home Management : 38-52 mins  [name], OTA Acute Rehabilitation Services  Office [phone]   [name] 10/02/2023, 3:24 PM

## 2023-10-02 NOTE — Progress Notes (Signed)
 PHARMACY - ANTICOAGULATION CONSULT NOTE  Pharmacy Consult for IV heparin  Indication: DVT  Allergies  Allergen Reactions   Morphine  And Codeine Anaphylaxis   Shellfish Allergy Anaphylaxis   Betadine  [Povidone Iodine ] Itching   Bupropion Other (See Comments)    Shaking of the body, hallucinations    Chlorhexidine  Hives    Patient reports never had issues CHG with mouth rinse.   Influenza Vaccines Hives   Metoprolol  Rash    Patient Measurements: Height: 6' 0.01 (182.9 cm) Weight: 100.9 kg (222 lb 7.1 oz) IBW/kg (Calculated) : 77.62 HEPARIN  DW (KG): 104.1  Vital Signs: Temp: 97.4 F (36.3 C) (07/17 0400) Temp Source: Axillary (07/17 0400) BP: 141/103 (07/17 0400) Pulse Rate: 86 (07/17 0400)  Labs: Recent Labs    09/29/23 1006 10/01/23 0016  HGB 8.4*  --   HCT 25.9*  --   PLT 298  --   CREATININE 0.94 1.12    Estimated Creatinine Clearance: 87.3 mL/min (by C-G formula based on SCr of 1.12 mg/dL).   Medical History: Past Medical History:  Diagnosis Date   Anxiety    Arthritis    Asthma    Bipolar disorder (HCC)    Current every day smoker    Depression    Diabetes mellitus, type II (HCC)    Dyspnea    History of kidney stones    History of pneumonia    Hyperlipidemia    Hypertension    Morbid obesity (HCC)    Sedentary lifestyle    Seizures (HCC)    last seizure 12 yrs ago, no current problem   Sleep apnea    uses CPAP nightly   Medications:  Scheduled:   (feeding supplement) PROSource Plus  30 mL Oral TID BM   acyclovir   400 mg Oral BID   amoxicillin -clavulanate  1 tablet Oral Q12H   epoetin  alfa  40,000 Units Subcutaneous Q Wed-1800   feeding supplement  237 mL Oral BID WC   ferrous sulfate   325 mg Oral Q breakfast   free water   50 mL Per Tube Daily   guaiFENesin   600 mg Oral BID   insulin  aspart  0-15 Units Subcutaneous TID AC & HS   insulin  aspart  2 Units Subcutaneous TID AC & HS   insulin  glargine-yfgn  10 Units Subcutaneous Q24H    lidocaine   1 patch Transdermal Q24H   linezolid   600 mg Oral Q12H   montelukast   5 mg Oral QHS   multivitamin with minerals  1 tablet Oral Daily   nutrition supplement (JUVEN)  1 packet Oral BID BM   mouth rinse  15 mL Mouth Rinse 4 times per day   pantoprazole   40 mg Oral BID   PARoxetine   40 mg Oral Daily   polyethylene glycol  17 g Oral BID   senna  1 tablet Oral Daily   sodium chloride  flush  10-40 mL Intracatheter Q12H   sodium hypochlorite   Irrigation Daily   umeclidinium-vilanterol  1 puff Inhalation Daily   valproic  acid  750 mg Oral BID   voriconazole   400 mg Oral Q12H   Infusions:   sodium chloride  Stopped (08/28/23 1208)   sodium chloride  Stopped (08/28/23 9160)   Assessment: 58 YO male with new DVT despite previous enoxaparin  50 mg daily. Patient received one dose of apixaban  10mg  on PM of 7/16; pharmacy consulted to dose heparin  given plans for surgery in near future.  -No anticoagulation received PTA -Hgb stable ~8, Plt WNL -No reported signs/symptoms of  bleeding  Goal of Therapy:  Heparin  level 0.3-0.7 units/ml aPTT 66-102 seconds Monitor platelets by anticoagulation protocol: Yes   Plan:  Start heparin  infusion at 1450 units/hr starting at 0900 today. No heparin  bolus given apixaban  dose last night. Check heparin  level and aPTT in 6h Monitor heparin  levels, aPTT, CBC, signs/symptoms of bleeding  Frank [name], PharmD PGY1 Pharmacy Resident [facility]  10/02/2023 7:10 AM

## 2023-10-02 NOTE — Plan of Care (Signed)

## 2023-10-02 NOTE — Progress Notes (Signed)
 Speech Language Pathology Treatment: Dysphagia  Patient Details Name: Frank [name]. MRN: [id number] DOB: 01-Sep-1965 Today's Date: 10/02/2023 Time: 1510-1530 SLP Time Calculation (min) (ACUTE ONLY): 20 min  Assessment / Plan / Recommendation Clinical Impression  Pt tolerating regular diet. Wife at bedside, Reviewed precautions, appropriate food textures. No further SLP treatment needed will sign off.   HPI HPI: 58 y.o. male transferred from [facility] 07/03/23 to [facility] for management of newly diagnosed plasma cell myeloma with weakness and AMS. Pt also with AKI and metabolic encephalopathy. 5/10 transfer to [facility] for iHD.Bedside swallow eval 4/18 with recs for NPO due to somnolence. 4/22 Intubated and chemo initiated. 4/23-5/10 CRRT. 5/6 IVIG started. 5/8 trach. 5/12 iHD initiated. 5/18 inability to oxygenate through trach, intubated and trach revision. Subsequently decannulated. PEG. PMHx:Lt THA, carpal tunnel syndrome, Lt TKA, Lt humerus fx, PAF, HTN, HLD, bipolar disorder, obesity, T2DM.      SLP Plan  All goals met          Recommendations  Diet recommendations: Regular;Thin liquid                              All goals met     Frank [name], Frank [name]  10/02/2023, 3:36 PM

## 2023-10-02 NOTE — Progress Notes (Signed)
 Referring Physician(s): Montano,Emil  Supervising Physician: Hughes Simmonds  Patient Status:  [facility] - In-pt  Chief Complaint:   S/p R hip drain placement 7/11 by Dr. Jenna    Brief [facility] Course: Aylan Bayona Frank [name]. is a 58 y.o. male with past medical history significant for OSA, uses CPAP, multiple myeloma, A-fib (not on AC), hyperlipidemia, tobacco use disorder.  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 He was admitted to an outside hospital with concern for sepsis, pneumonia, encephalopathy after feeling weak for 1 week.  He developed renal failure while admitted in the ICU for 36 days.  His care required tracheostomy, HD, PEG tube placement.  Currently the patient is not requiring ventilator (room air) or hemodialysis.   While admitted patient developed right leg numbness and pain.  CT of the hip revealed a large fluid collection intermuscularly (gluteus medius and rectus femoris).  Ortho was consulted and recommended IR aspiration and drain placement which was approved and performed by Dr. Jenna on 7/11 (10Fr ).    Subjective:   Patient reports he initially felt improvement in decreased right leg swelling after drain placement, but he has more recently been experiencing burning around the insertion site, increasing right leg swelling, right thigh heat, increased pain.  Denies fever, chills, nausea, vomiting. He is up working with physical therapy at time of visit.   Allergies: Morphine  and codeine, Shellfish allergy, Betadine  [povidone iodine ], Bupropion, Chlorhexidine , Influenza vaccines, and Metoprolol   Medications: Prior to Admission medications   Medication Sig Start Date End Date Taking? Authorizing Provider  albuterol  (PROVENTIL  HFA;VENTOLIN  HFA) 108 (90 Base) MCG/ACT inhaler Inhale 2 puffs into the lungs every 6 (six) hours as  needed for wheezing or shortness of breath.   Yes [provider]  albuterol  (PROVENTIL ) (2.5 MG/3ML) 0.083% nebulizer solution 2.5 mg every 2 (two) hours as needed for wheezing or shortness of breath.   Yes [provider]  aspirin  EC 81 MG tablet Take 81 mg by mouth daily. Swallow whole.   Yes [provider]  busPIRone  (BUSPAR ) 15 MG tablet Take 15 mg by mouth at bedtime.   Yes [provider]  celecoxib  (CELEBREX ) 200 MG capsule Take 200 mg by mouth daily.   Yes [provider]  clonazePAM  (KLONOPIN ) 1 MG tablet Take 1 mg by mouth once as needed for anxiety.   Yes [provider]  cyclobenzaprine  (FLEXERIL ) 5 MG tablet Take 1 tablet (5 mg total) by mouth 3 (three) times daily as needed for muscle spasms. Patient taking differently: Take 5 mg by mouth daily as needed for muscle spasms. 01/30/23  Yes McClung, Sarah A, PA-C  divalproex  (DEPAKOTE ) 500 MG DR tablet Take 500 mg by mouth 2 (two) times daily.   Yes [provider]  EPINEPHrine  0.3 mg/0.3 mL IJ SOAJ injection Inject 0.3 mg into the muscle as needed for anaphylaxis. 08/28/21  Yes [provider]  esomeprazole (NEXIUM) 40 MG capsule Take 40 mg by mouth in the morning.   Yes [provider]  Fluticasone -Salmeterol (ADVAIR) 500-50 MCG/DOSE AEPB Inhale 2 puffs into the lungs in the morning.   Yes [provider]  furosemide  (LASIX ) 40 MG tablet Take 40 mg by mouth daily as needed for fluid or edema.   Yes [provider]  gabapentin  (NEURONTIN ) 300 MG capsule Take 300-600 mg by mouth See admin instructions. 300 mg in the  morning, 600 mg at bedtime   Yes [provider]  hydrALAZINE  (APRESOLINE ) 10 MG tablet Take 10 mg by mouth in the morning and at bedtime.   Yes [provider]  hydrochlorothiazide  (HYDRODIURIL ) 25 MG tablet Take 25 mg by mouth daily.   Yes [provider]  lisinopril  (ZESTRIL ) 40 MG tablet Take 40 mg  by mouth daily. 03/25/23  Yes [provider]  metFORMIN  (GLUCOPHAGE ) 1000 MG tablet Take 1,000 mg by mouth 2 (two) times daily with a meal.   Yes [provider]  montelukast  (SINGULAIR ) 10 MG tablet Take 10 mg by mouth at bedtime.   Yes [provider]  OVER THE COUNTER MEDICATION Take 400 mg by mouth at bedtime. Burdock root   Yes [provider]  oxyCODONE  (ROXICODONE ) 5 MG immediate release tablet Take 1 tablet (5 mg total) by mouth every 4 (four) hours as needed for severe pain (pain score 7-10). 01/30/23  Yes Danton Lauraine LABOR, PA-C  PARoxetine  (PAXIL ) 40 MG tablet Take 40 mg by mouth every morning.   Yes [provider]  spironolactone  (ALDACTONE ) 25 MG tablet Take 25 mg by mouth at bedtime. 10/07/21  Yes [provider]  tamsulosin  (FLOMAX ) 0.4 MG CAPS capsule Take 0.4 mg by mouth at bedtime. 10/07/21  Yes [provider]  testosterone  cypionate (DEPOTESTOSTERONE CYPIONATE) 200 MG/ML injection Inject 0.5 mLs (100 mg total) into the muscle every 14 (fourteen) days. 09/17/16  Yes Nida, Gebreselassie W, MD  verapamil  (CALAN -SR) 120 MG CR tablet Take 120 mg by mouth daily.   Yes [provider]  Vitamin D , Ergocalciferol , (DRISDOL ) 1.25 MG (50000 UNIT) CAPS capsule Take 1 capsule (50,000 Units total) by mouth every Thursday. Patient taking differently: Take 50,000 Units by mouth once a week. Tuesday 02/06/23  Yes Danton Lauraine LABOR, PA-C  buPROPion (WELLBUTRIN XL) 150 MG 24 hr tablet Take 150 mg by mouth daily. Patient not taking: Reported on 07/04/2023    [provider]     Vital Signs: BP 133/82 (BP Location: Right Wrist)   Pulse 100   Temp 98.6 F (37 C) (Oral)   Resp 18   Ht 6' (1.829 m)   Wt 222 lb 7.1 oz (100.9 kg)   SpO2 100%   BMI 30.17 kg/m   Physical Exam Constitutional:      Appearance: He is obese.  Pulmonary:     Effort: Pulmonary effort is normal.  Musculoskeletal:     Comments: L arm in  sling R hip drain present with scant brown fluid  Neurological:     Mental Status: He is alert and oriented to person, place, and time.  Psychiatric:        Mood and Affect: Mood normal.        Behavior: Behavior normal.        Thought Content: Thought content normal.        Judgment: Judgment normal.     Imaging: VAS US  LOWER EXTREMITY VENOUS (DVT) Result Date: 10/01/2023  Lower Venous DVT Study Patient Name:  Lennin Osmond.  Date of Exam:   10/01/2023 Medical Rec #: [id number]           Accession #:    [id number] Date of Birth: [birth year]           Patient Gender: M Patient Age:   69 years Exam Location:  [facility] Procedure:      VAS US  LOWER EXTREMITY VENOUS (DVT) Referring Phys: DELILIAH RASHID --------------------------------------------------------------------------------  Indications: Right thigh swelling, erythema, pain.  Risk Factors: Extended hospitalization with immobility (3 months). Limitations: Poor ultrasound/tissue interface. Comparison Study: LLEV on 07/18/2023 was negative for DVT. Performing Technologist: Ezzie Potters RVT, RDMS  Examination Guidelines: A complete evaluation includes B-mode imaging, spectral Doppler, color Doppler, and power Doppler as needed of all accessible portions of each vessel. Bilateral testing is considered an integral part of a complete examination. Limited examinations for reoccurring indications may be performed as noted. The reflux portion of the exam is performed with the patient in reverse Trendelenburg.  +---------+---------------+---------+-----------+----------+-------------------+ RIGHT    CompressibilityPhasicitySpontaneityPropertiesThrombus Aging      +---------+---------------+---------+-----------+----------+-------------------+ CFV      Full           Yes      Yes                                      +---------+---------------+---------+-----------+----------+-------------------+ SFJ      Full                                                              +---------+---------------+---------+-----------+----------+-------------------+ FV Prox  Full           Yes      Yes                                      +---------+---------------+---------+-----------+----------+-------------------+ FV Mid   Full           Yes      Yes                                      +---------+---------------+---------+-----------+----------+-------------------+ FV DistalFull           Yes      Yes                                      +---------+---------------+---------+-----------+----------+-------------------+ PFV      Full                                                             +---------+---------------+---------+-----------+----------+-------------------+ POP      Full           Yes      Yes                                      +---------+---------------+---------+-----------+----------+-------------------+ PTV      None           No       No                   Age indeterminate                                                         (  one of paired)     +---------+---------------+---------+-----------+----------+-------------------+ PERO     Full                                                             +---------+---------------+---------+-----------+----------+-------------------+   +---------+---------------+---------+-----------+----------+--------------+ LEFT     CompressibilityPhasicitySpontaneityPropertiesThrombus Aging +---------+---------------+---------+-----------+----------+--------------+ CFV      None           No       No                   Acute          +---------+---------------+---------+-----------+----------+--------------+ SFJ      Partial                                      Acute          +---------+---------------+---------+-----------+----------+--------------+ FV Prox  Full           Yes      Yes                                  +---------+---------------+---------+-----------+----------+--------------+ FV Mid   Full           Yes      Yes                                 +---------+---------------+---------+-----------+----------+--------------+ FV DistalFull           Yes      Yes                                 +---------+---------------+---------+-----------+----------+--------------+ PFV      None           No       No                                  +---------+---------------+---------+-----------+----------+--------------+ POP      Full           Yes      Yes                                 +---------+---------------+---------+-----------+----------+--------------+ PTV      Full                                                        +---------+---------------+---------+-----------+----------+--------------+ PERO     Full                                                        +---------+---------------+---------+-----------+----------+--------------+ Proximal CFV  compresses, has color, and phasic flow. Mid and distal CFV have occlusive thrombus    Summary: BILATERAL: -No evidence of popliteal cyst, bilaterally. RIGHT: - Findings consistent with age indeterminate deep vein thrombosis involving the right posterior tibial veins.   LEFT: - Findings consistent with acute deep vein thrombosis involving the left common femoral vein, SF junction, and left femoral vein.  - The common femoral vein obstruction does not appear to extend above inguinal ligament.  *See table(s) above for measurements and observations. Electronically signed by Gaile New MD on 10/01/2023 at 5:11:34 PM.    Final    IR GASTROSTOMY TUBE REMOVAL Result Date: 10/01/2023 INDICATION: Gastrostomy tube is no longer needed. EXAM: GASTROSTOMY TUBE REMOVAL MEDICATIONS: None ANESTHESIA/SEDATION: None CONTRAST:  None FLUOROSCOPY: None COMPLICATIONS: None immediate. PROCEDURE: Informed written consent was obtained from the patient  after a thorough discussion of the procedural risks, benefits and alternatives. All questions were addressed. A timeout was performed prior to the initiation of the procedure. Balloon retention 18 French gastrostomy tube was intact. 5 cc syringe was used to remove 5 cc of saline from balloon. Gastrostomy tube was removed without difficulty. Site was cleaned and dressed appropriately. IMPRESSION: Successful removal of 18 French balloon retention gastrostomy tube. Performed by Carlin Griffon, PA-C Electronically Signed   By: Juliene Balder M.D.   On: 10/01/2023 16:40   US  RT LOWER EXTREM LTD SOFT TISSUE NON VASCULAR Result Date: 09/30/2023 CLINICAL DATA:  Evaluate for abscess. EXAM: ULTRASOUND RIGHT LOWER EXTREMITY LIMITED TECHNIQUE: Ultrasound examination of the lower extremity soft tissues was performed in the area of clinical concern. COMPARISON:  None Available. FINDINGS: In the region of palpable abnormality in the lateral aspect of the right thigh there is a well-circumscribed is 8.5 x 2.3 x 6.8 cm fluid collection in the subcutaneous tissues. This is 1.8 cm deep to the skin. There is no abnormal vascularity. There is no soft tissue mass. IMPRESSION: 8.5 cm fluid collection in the subcutaneous tissues of the lateral aspect of the right thigh. This may represent a seroma or abscess. Electronically Signed   By: Greig Pique M.D.   On: 09/30/2023 21:47   DG Knee 1-2 Views Right Result Date: 09/29/2023 CLINICAL DATA:  Knee pain and swelling EXAM: RIGHT KNEE - 1-2 VIEW COMPARISON:  X-ray 01/18/2019 FINDINGS: Moderate joint effusion. Moderate joint space loss of the medial compartment and patellofemoral joint. Tricompartmental osteophytes are seen greatest of the patellofemoral joint. Chondrocalcinosis also identified. There is medial translation of the femur relative to the tibia as well. Preserved bone mineralization. IMPRESSION: Advanced tricompartmental degenerative changes. Moderate joint effusion.  Chondrocalcinosis Electronically Signed   By: Ranell Bring M.D.   On: 09/29/2023 15:57   DG Ribs Unilateral Left Result Date: 09/28/2023 CLINICAL DATA:  Left-sided rib pain after cough EXAM: LEFT RIBS - 2 VIEW COMPARISON:  Chest x-ray 09/28/2023.  Chest CT 07/26/2023. FINDINGS: Expansile anterolateral left 6 rib lesion is again noted. No displaced fractures are identified. Lungs are grossly clear. IMPRESSION: Expansile anterolateral left 6 rib lesion is again noted. Correlate for point tenderness. No displaced fractures are identified. Electronically Signed   By: Greig Pique M.D.   On: 09/28/2023 21:50    Labs:  CBC: Recent Labs    09/27/23 0931 09/28/23 1634 09/29/23 1006 10/02/23 0848  WBC 4.5 4.3 3.9* 4.4  HGB 7.9* 8.3* 8.4* 8.9*  HCT 24.0* 25.6* 25.9* 27.0*  PLT 262 293 298 392    COAGS: Recent Labs    07/05/23 2227  07/07/23 2030 07/10/23 0411 07/23/23 0416 07/24/23 0421 07/25/23 0409 07/26/23 0423 08/13/23 0908  INR 2.1* 1.9*  --   --   --   --   --  1.0  APTT  --  44*   < > 39* 45* 34 36  --    < > = values in this interval not displayed.    BMP: Recent Labs    09/27/23 0931 09/29/23 1006 10/01/23 0016 10/02/23 0848  NA 132* 130* 130* 134*  K 4.5 4.3 4.6 4.0  CL 101 98 99 102  CO2 22 22 20* 21*  GLUCOSE 133* 134* 121* 138*  BUN 42* 46* 48* 38*  CALCIUM  8.3* 8.6* 8.4* 8.8*  CREATININE 0.93 0.94 1.12 0.93  GFRNONAA >60 >60 >60 >60    LIVER FUNCTION TESTS: Recent Labs    09/26/23 0447 09/27/23 0931 10/01/23 0016 10/02/23 0848  BILITOT 0.4 0.4 0.6 0.3  AST 11* 9* 10* 11*  ALT 7 6 6 6   ALKPHOS 76 71 79 78  PROT 5.3* 5.3* 5.2* 5.4*  ALBUMIN  1.9* 1.9* 2.0* 1.9*    Assessment and Plan:  Unfortunately, the US  of RUE did not include a wide enough scope of view to include tip of existing R hip drain, still unclear if increasing fluid collection communicates with current drain. Discussed this with Dr. Hughes. A noncon CT was ordered. 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 Dr. Hughes notes that sclerosing agent may benefit patient in future. Will discuss this avenue further after imaging.   Output by Drain (mL) 09/30/23 0701 - 09/30/23 1900 09/30/23 1901 - 10/01/23 0700 10/01/23 0701 - 10/01/23 1900 10/01/23 1901 - 10/02/23 0700 10/02/23 0701 - 10/02/23 1443  Closed System Drain Right Buttock Bulb (JP) 10 Fr. 45 60 45        Electronically Signed: Laymon Coast, NP 10/02/2023, 2:36 PM   I spent a total of 15 Minutes at the the patient's bedside AND on the patient's hospital floor or unit, greater than 50% of which was counseling/coordinating care for existing R hip drain maintenance.

## 2023-10-02 NOTE — Progress Notes (Signed)
 Physical Therapy Treatment Patient Details Name: Frank [name]. MRN: [id number] DOB: 08/20/1965 Today's Date: 10/02/2023   History of Present Illness 58 y.o. male transferred from [facility] 07/03/23 to [facility] for management of newly diagnosed plasma cell myeloma with weakness and AMS. Pt also with AKI and metabolic encephalopathy. 5/10 transfer to [facility] for iHD. 4/22 Intubated and chemo initiated. 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 5/6 IVIG started. 5/8-6/11 trach. 5/12 iHD initiated. 5/29 PEG placed. 6/5 chemo initiated. PMHx:Lt THA, carpal tunnel syndrome, Lt TKA, Lt humerus fx, PAF, HTN, HLD, bipolar disorder, obesity, T2DM    PT Comments  STAR PT/OT Session: Pt requesting to get up to [facility] for BM today. Given low surface of BSC and inability to stand with Stedy from that height. PT/OT agreed to perform slide board transfer with use of bed pad to protect pt sacral wound. Pt able to lean for placement of slide board and is mod Ax2 for steadying with sliding to [facility] on his L. Once on BSC able to lean for removal of slide board and bed pad. PT left pt with OT.     If plan is discharge home, recommend the following: Two people to help with walking and/or transfers;Assistance with cooking/housework;Assist for transportation;Two people to help with bathing/dressing/bathroom;Direct supervision/assist for medications management;Direct supervision/assist for financial management;Help with stairs or ramp for entrance;Assistance with feeding   Can travel by private vehicle     No  Equipment Recommendations  Hospital bed;Hoyer lift;Wheelchair (measurements PT);Wheelchair cushion (measurements PT);BSC/3in1    Recommendations for Other Services       Precautions / Restrictions Precautions Precautions: Fall Recall of Precautions/Restrictions: Impaired Precaution/Restrictions Comments: episode of syncope, sacral wound, chemo. Lt humerus non-union, splinted. no elbow ROM, but shoulder ROM ok per secure chat  with Dr. Jonel 7/1 Required Braces or Orthoses: Sling Restrictions Weight Bearing Restrictions Per Provider Order: Yes LUE Weight Bearing Per Provider Order: Non weight bearing Other Position/Activity Restrictions: Lt humerus non-union, splinted. no elbow ROM, but shoulder ROM ok per secure chat with Dr. Jonel 7/1     Mobility  Bed Mobility Overal bed mobility: Needs Assistance Bed Mobility: Rolling, Supine to Sit Rolling: Max assist   Supine to sit: Max assist, HOB elevated, Used rails     General bed mobility comments: use of bed pads    Transfers Overall transfer level: Needs assistance Equipment used: Sliding board Transfers: Bed to chair/wheelchair/BSC            Lateral/Scoot Transfers: Mod assist, +2 physical assistance, Max assist General transfer comment: mod assist +2 for slide board transfer to drop arm BSC and max assist +2 from BSC back to EOB           Balance Overall balance assessment: Needs assistance Sitting-balance support: Single extremity supported, Feet supported Sitting balance-Leahy Scale: Fair Sitting balance - Comments: CGA to supervision for safety when EOB                                    Communication Communication Communication: Impaired Factors Affecting Communication: Reduced clarity of speech  Cognition Arousal: Alert Behavior During Therapy: WFL for tasks assessed/performed                             Following commands: Impaired Following commands impaired: Follows one step commands with increased time    Cueing Cueing Techniques:  Verbal cues, Tactile cues, Visual cues     General Comments General comments (skin integrity, edema, etc.): VSS on RA      Pertinent Vitals/Pain Pain Assessment Pain Assessment: Faces Faces Pain Scale: Hurts even more Pain Location: left shoulder and Right knee Pain Descriptors / Indicators: Discomfort, Grimacing, Sharp, Moaning Pain Intervention(s):  Limited activity within patient's tolerance, Monitored during session, Repositioned     PT Goals (current goals can now be found in the care plan section) Acute Rehab PT Goals PT Goal Formulation: With patient Time For Goal Achievement: 10/09/23 Potential to Achieve Goals: Fair Progress towards PT goals: Progressing toward goals    Frequency    Min 5X/week           Co-evaluation PT/OT/SLP Co-Evaluation/Treatment: Yes Reason for Co-Treatment: For patient/therapist safety;To address functional/ADL transfers PT goals addressed during session: Mobility/safety with mobility;Balance OT goals addressed during session: ADL's and self-care      AM-PAC PT 6 Clicks Mobility   Outcome Measure  Help needed turning from your back to your side while in a flat bed without using bedrails?: A Lot Help needed moving from lying on your back to sitting on the side of a flat bed without using bedrails?: Total Help needed moving to and from a bed to a chair (including a wheelchair)?: Total Help needed standing up from a chair using your arms (e.g., wheelchair or bedside chair)?: Total Help needed to walk in hospital room?: Total Help needed climbing 3-5 steps with a railing? : Total 6 Click Score: 7    End of Session   Activity Tolerance: Patient limited by pain Patient left: in bed;with call bell/phone within reach Nurse Communication: Mobility status PT Visit Diagnosis: Other abnormalities of gait and mobility (R26.89);Muscle weakness (generalized) (M62.81);Difficulty in walking, not elsewhere classified (R26.2);Unsteadiness on feet (R26.81)     Time: 1342-1410 PT Time Calculation (min) (ACUTE ONLY): 28 min  Charges:    $Therapeutic Activity: 8-22 mins PT General Charges $$ ACUTE PT VISIT: 1 Visit                     Jerusalen Mateja B. Fleeta Lapidus PT, DPT Acute Rehabilitation Services Please use secure chat or  Call Office [phone]    [name] [facility] 10/02/2023,  4:26 PM

## 2023-10-02 NOTE — Progress Notes (Signed)
 Physical Therapy Treatment Patient Details Name: Frank [name]. MRN: [id number] DOB: 01/17/1966 Today's Date: 10/02/2023   History of Present Illness (P) 58 y.o. male transferred from [facility] 07/03/23 to [facility] for management of newly diagnosed plasma cell myeloma with weakness and AMS. Pt also with AKI and metabolic encephalopathy. 5/10 transfer to [facility] for iHD. 4/22 Intubated and chemo initiated. 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 5/6 IVIG started. 5/8-6/11 trach. 5/12 iHD initiated. 5/29 PEG placed. 6/5 chemo initiated. PMHx:Lt THA, carpal tunnel syndrome, Lt TKA, Lt humerus fx, PAF, HTN, HLD, bipolar disorder, obesity, T2DM    PT Comments  STAR PT/OT Session:: Once pt finished on BSC, assisted OT with return to bed. Pt with increased fatigue requiring maxAx2 for slide board transfer back to bed and for supine to sit.  Pt very appreciative of opportunity to use BSC. D/c plans remain appropriate at this time. PT will continue to follow acutely.        10/02/23 1630  PT Visit Information  Last PT Received On 10/02/23  Assistance Needed +2  PT/OT/SLP Co-Evaluation/Treatment Yes  Reason for Co-Treatment For patient/therapist safety;To address functional/ADL transfers  PT goals addressed during session Mobility/safety with mobility;Balance  OT goals addressed during session ADL's and self-care  History of Present Illness 58 y.o. male transferred from [facility] 07/03/23 to [facility] for management of newly diagnosed plasma cell myeloma with weakness and AMS. Pt also with AKI and metabolic encephalopathy. 5/10 transfer to [facility] for iHD. 4/22 Intubated and chemo initiated. 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 5/6 IVIG started. 5/8-6/11 trach. 5/12 iHD initiated. 5/29 PEG placed. 6/5 chemo initiated. PMHx:Lt THA, carpal tunnel syndrome, Lt TKA, Lt humerus fx, PAF, HTN, HLD, bipolar disorder, obesity, T2DM  Precautions  Precautions Fall  Recall of Precautions/Restrictions Impaired  Precaution/Restrictions  Comments episode of syncope, sacral wound, chemo. Lt humerus non-union, splinted. no elbow ROM, but shoulder ROM ok per secure chat with Dr. Jonel 7/1  Required Braces or Orthoses Sling  Restrictions  Weight Bearing Restrictions Per Provider Order Yes  LUE Weight Bearing Per Provider Order NWB  Other Position/Activity Restrictions Lt humerus non-union, splinted. no elbow ROM, but shoulder ROM ok per secure chat with Dr. Jonel 7/1  Pain Assessment  Pain Assessment Faces  Faces Pain Scale 6  Pain Location left shoulder and Right knee  Pain Descriptors / Indicators Discomfort;Grimacing;Sharp;Moaning  Pain Intervention(s) Limited activity within patient's tolerance;Monitored during session;Repositioned  Cognition  Arousal Alert  Behavior During Therapy WFL for tasks assessed/performed  Following Commands  Following commands Impaired  Following commands impaired Follows one step commands with increased time  Cueing  Cueing Techniques Verbal cues;Tactile cues;Visual cues  Communication  Communication Impaired  Factors Affecting Communication Reduced clarity of speech  Bed Mobility  Overal bed mobility Needs Assistance  Bed Mobility Rolling;Supine to Sit  Rolling Max assist;+2 for physical assistance  Sit to supine +2 for physical assistance;Total assist  General bed mobility comments once pt slid back to bed surface requires totalAx2 for returnign to supine due to increased pt fatigue  Transfers  Overall transfer level Needs assistance  Equipment used Sliding board  Transfers Bed to chair/wheelchair/BSC  Bed to/from chair/wheelchair/BSC transfer type: Lateral/scoot transfer   Lateral/Scoot Transfers +2 physical assistance;Max assist  General transfer comment maxAx2 for sliding back to deflated bed to provide down hill slide  Balance  Overall balance assessment Needs assistance  Sitting-balance support Single extremity supported;Feet supported  Sitting balance-Leahy Scale Fair   Sitting balance -  Comments CGA to supervision for safety when EOB  General Comments  General comments (skin integrity, edema, etc.) VSS on RA  PT - End of Session  Equipment Utilized During Treatment Gait belt  Activity Tolerance Patient limited by fatigue  Patient left in bed;with call bell/phone within reach  Nurse Communication Mobility status   PT - Assessment/Plan  PT Visit Diagnosis Other abnormalities of gait and mobility (R26.89);Muscle weakness (generalized) (M62.81);Difficulty in walking, not elsewhere classified (R26.2);Unsteadiness on feet (R26.81)  PT Frequency (ACUTE ONLY) Min 5X/week  Follow Up Recommendations Skilled nursing-short term rehab (<3 hours/day)  Can patient physically be transported by private vehicle No  Patient can return home with the following Two people to help with walking and/or transfers;Assistance with cooking/housework;Assist for transportation;Two people to help with bathing/dressing/bathroom;Direct supervision/assist for medications management;Direct supervision/assist for financial management;Help with stairs or ramp for entrance;Assistance with feeding  PT equipment Hospital bed;Hoyer lift;Wheelchair (measurements PT);Wheelchair cushion (measurements PT);BSC/3in1  AM-PAC PT 6 Clicks Mobility Outcome Measure (Version 2)  Help needed turning from your back to your side while in a flat bed without using bedrails? 2  Help needed moving from lying on your back to sitting on the side of a flat bed without using bedrails? 1  Help needed moving to and from a bed to a chair (including a wheelchair)? 1  Help needed standing up from a chair using your arms (e.g., wheelchair or bedside chair)? 1  Help needed to walk in hospital room? 1  Help needed climbing 3-5 steps with a railing?  1  6 Click Score 7  Consider Recommendation of Discharge To: CIR/SNF/LTACH  Progressive Mobility  What is the highest level of mobility based on the progressive mobility  assessment? Level 2 (Chairfast) - Balance while sitting on edge of bed and cannot stand  Mobility Referral No  Activity Transferred to/from [facility]  PT Goal Progression  Progress towards PT goals Progressing toward goals  Acute Rehab PT Goals  PT Goal Formulation With patient  Time For Goal Achievement 10/09/23  Potential to Achieve Goals Fair  PT Time Calculation  PT Start Time (ACUTE ONLY) 1428  PT Stop Time (ACUTE ONLY) 1450  PT Time Calculation (min) (ACUTE ONLY) 22 min  PT General Charges  $$ ACUTE PT VISIT 1 Visit  PT Treatments  $Therapeutic Activity 8-22 mins   Time: (P) 1428-(P) 1450 PT Time Calculation (min) (ACUTE ONLY): (P) 22 min  Charges:    $Therapeutic Activity: (P) 8-22 mins PT General Charges $$ ACUTE PT VISIT: (P) 1 Visit                     Thedford Bunton B. Fleeta Lapidus PT, DPT Acute Rehabilitation Services Please use secure chat or  Call Office [phone]    [name] [facility] 10/02/2023, 4:36 PM

## 2023-10-02 NOTE — Progress Notes (Signed)
 PHARMACY - ANTICOAGULATION CONSULT NOTE  Pharmacy Consult for IV heparin  Indication: DVT  Allergies  Allergen Reactions   Morphine  And Codeine Anaphylaxis   Shellfish Allergy Anaphylaxis   Betadine  [Povidone Iodine ] Itching   Bupropion Other (See Comments)    Shaking of the body, hallucinations    Chlorhexidine  Hives    Patient reports never had issues CHG with mouth rinse.   Influenza Vaccines Hives   Metoprolol  Rash    Patient Measurements: Height: 6' (182.9 cm) Weight: 100.9 kg (222 lb 7.1 oz) IBW/kg (Calculated) : 77.6 HEPARIN  DW (KG): 104.1  Vital Signs: Temp: 98.4 F (36.9 C) (07/17 1604) Temp Source: Oral (07/17 1604) BP: 155/101 (07/17 1604) Pulse Rate: 95 (07/17 1604)  Labs: Recent Labs    10/01/23 0016 10/02/23 0848 10/02/23 1456  HGB  --  8.9*  --   HCT  --  27.0*  --   PLT  --  392  --   APTT  --   --  43*  HEPARINUNFRC  --   --  0.54  CREATININE 1.12 0.93  --     Estimated Creatinine Clearance: 105.1 mL/min (by C-G formula based on SCr of 0.93 mg/dL).   Medical History: Past Medical History:  Diagnosis Date   Anxiety    Arthritis    Asthma    Bipolar disorder (HCC)    Current every day smoker    Depression    Diabetes mellitus, type II (HCC)    Dyspnea    History of kidney stones    History of pneumonia    Hyperlipidemia    Hypertension    Morbid obesity (HCC)    Sedentary lifestyle    Seizures (HCC)    last seizure 12 yrs ago, no current problem   Sleep apnea    uses CPAP nightly   Medications:  Scheduled:   (feeding supplement) PROSource Plus  30 mL Oral TID BM   acyclovir   400 mg Oral BID   amoxicillin -clavulanate  1 tablet Oral Q12H   epoetin  alfa  40,000 Units Subcutaneous Q Wed-1800   feeding supplement  237 mL Oral BID WC   ferrous sulfate   325 mg Oral Q breakfast   free water   50 mL Per Tube Daily   guaiFENesin   600 mg Oral BID   insulin  aspart  0-15 Units Subcutaneous TID AC & HS   insulin  aspart  2 Units  Subcutaneous TID AC & HS   insulin  glargine-yfgn  10 Units Subcutaneous Q24H   lidocaine   1 patch Transdermal Q24H   linezolid   600 mg Oral Q12H   montelukast   5 mg Oral QHS   multivitamin with minerals  1 tablet Oral Daily   nutrition supplement (JUVEN)  1 packet Oral BID BM   mouth rinse  15 mL Mouth Rinse 4 times per day   pantoprazole   40 mg Oral BID   PARoxetine   40 mg Oral Daily   polyethylene glycol  17 g Oral BID   senna  1 tablet Oral Daily   sodium chloride  flush  10-40 mL Intracatheter Q12H   sodium hypochlorite   Irrigation Daily   umeclidinium-vilanterol  1 puff Inhalation Daily   valproic  acid  750 mg Oral BID   voriconazole   400 mg Oral Q12H   Infusions:   sodium chloride  Stopped (08/28/23 1208)   sodium chloride  Stopped (08/28/23 0839)   heparin  1,450 Units/hr (10/02/23 0825)   Assessment: 58 YO male with new DVT despite previous enoxaparin  50 mg  daily. Patient received one dose of apixaban  10mg  on PM of 7/16; pharmacy consulted to dose heparin  given plans for surgery in near future.  -No anticoagulation received PTA -Hgb stable ~8, Plt WNL -No reported signs/symptoms of bleeding  PM follow up 1630: aPTT subtherapeutic at 43, heparin  level not correlating at 0.54.   Goal of Therapy:  Heparin  level 0.3-0.7 units/ml aPTT 66-102 seconds Monitor platelets by anticoagulation protocol: Yes   Plan:  Increase heparin  infusion to 1650 units/hr No heparin  bolus given c/f bleeding Check heparin  level and aPTT in 6h Adjust by heparin  level once correlating Monitor CBC, signs/symptoms of bleeding  Randall Call, PharmD, BCPS 10/02/23 4:35 PM

## 2023-10-03 DIAGNOSIS — A419 Sepsis, unspecified organism: Secondary | ICD-10-CM | POA: Diagnosis not present

## 2023-10-03 DIAGNOSIS — R6521 Severe sepsis with septic shock: Secondary | ICD-10-CM | POA: Diagnosis not present

## 2023-10-03 LAB — CBC WITH DIFFERENTIAL/PLATELET
Abs Immature Granulocytes: 0.02 K/uL (ref 0.00–0.07)
Basophils Absolute: 0 K/uL (ref 0.0–0.1)
Basophils Relative: 1 %
Eosinophils Absolute: 0.1 K/uL (ref 0.0–0.5)
Eosinophils Relative: 2 %
HCT: 27.1 % — ABNORMAL LOW (ref 39.0–52.0)
Hemoglobin: 8.9 g/dL — ABNORMAL LOW (ref 13.0–17.0)
Immature Granulocytes: 1 %
Lymphocytes Relative: 29 %
Lymphs Abs: 1.1 K/uL (ref 0.7–4.0)
MCH: 34.6 pg — ABNORMAL HIGH (ref 26.0–34.0)
MCHC: 32.8 g/dL (ref 30.0–36.0)
MCV: 105.4 fL — ABNORMAL HIGH (ref 80.0–100.0)
Monocytes Absolute: 0.8 K/uL (ref 0.1–1.0)
Monocytes Relative: 21 %
Neutro Abs: 1.8 K/uL (ref 1.7–7.7)
Neutrophils Relative %: 46 %
Platelets: 363 K/uL (ref 150–400)
RBC: 2.57 MIL/uL — ABNORMAL LOW (ref 4.22–5.81)
RDW: 19.4 % — ABNORMAL HIGH (ref 11.5–15.5)
WBC: 3.8 K/uL — ABNORMAL LOW (ref 4.0–10.5)
nRBC: 0 % (ref 0.0–0.2)

## 2023-10-03 LAB — COMPREHENSIVE METABOLIC PANEL WITH GFR
ALT: 7 U/L (ref 0–44)
AST: 9 U/L — ABNORMAL LOW (ref 15–41)
Albumin: 2 g/dL — ABNORMAL LOW (ref 3.5–5.0)
Alkaline Phosphatase: 77 U/L (ref 38–126)
Anion gap: 11 (ref 5–15)
BUN: 32 mg/dL — ABNORMAL HIGH (ref 6–20)
CO2: 23 mmol/L (ref 22–32)
Calcium: 8.6 mg/dL — ABNORMAL LOW (ref 8.9–10.3)
Chloride: 99 mmol/L (ref 98–111)
Creatinine, Ser: 0.82 mg/dL (ref 0.61–1.24)
GFR, Estimated: >60 mL/min (ref >60)
Glucose, Bld: 91 mg/dL (ref 70–99)
Potassium: 3.9 mmol/L (ref 3.5–5.1)
Sodium: 133 mmol/L — ABNORMAL LOW (ref 135–145)
Total Bilirubin: 0.5 mg/dL (ref 0.0–1.2)
Total Protein: 5.3 g/dL — ABNORMAL LOW (ref 6.5–8.1)

## 2023-10-03 LAB — GLUCOSE, CAPILLARY
Glucose-Capillary: 85 mg/dL (ref 70–99)
Glucose-Capillary: 146 mg/dL — ABNORMAL HIGH (ref 70–99)
Glucose-Capillary: 155 mg/dL — ABNORMAL HIGH (ref 70–99)
Glucose-Capillary: 189 mg/dL — ABNORMAL HIGH (ref 70–99)

## 2023-10-03 LAB — HEPARIN LEVEL (UNFRACTIONATED)
Heparin Unfractionated: 0.32 [IU]/mL (ref 0.30–0.70)
Heparin Unfractionated: 0.19 [IU]/mL — ABNORMAL LOW (ref 0.30–0.70)

## 2023-10-03 LAB — APTT: aPTT: 52 s — ABNORMAL HIGH (ref 24–36)

## 2023-10-03 NOTE — Progress Notes (Signed)
 Referring Physician(s): Montano,Emil  Supervising Physician: Dr. Johann  Patient Status:  [facility] - In-pt  Chief Complaint:   S/p R hip drain placement 7/11 by Dr. Jenna    Brief [facility] Course: Frank [name]. is a 58 y.o. male with past medical history significant for OSA, uses CPAP, multiple myeloma, A-fib (not on AC), hyperlipidemia, tobacco use disorder.  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 He was admitted to an outside hospital with concern for sepsis, pneumonia, encephalopathy after feeling weak for 1 week.  He developed renal failure while admitted in the ICU for 36 days.  His care required tracheostomy, HD, PEG tube placement.  Currently the patient is not requiring ventilator (room air) or hemodialysis.   While admitted patient developed right leg numbness and pain.  CT of the hip revealed a large fluid collection intermuscularly (gluteus medius and rectus femoris).  Ortho was consulted and recommended IR aspiration and drain placement which was approved and performed by Dr. Jenna on 7/11 (10Fr ).    Subjective:   Today, patient states that he is still experiencing pain in R leg but that the swelling and skin irritation at drain site has improved since last encounter. Denies fever, chills, N/V. Lying in bed at time of exam, awake, pleasant, interested in his care.   Allergies: Morphine  and codeine, Shellfish allergy, Betadine  [povidone iodine ], Bupropion, Chlorhexidine , Influenza vaccines, and Metoprolol   Medications: Prior to Admission medications   Medication Sig Start Date End Date Taking? Authorizing Provider  albuterol  (PROVENTIL  HFA;VENTOLIN  HFA) 108 (90 Base) MCG/ACT inhaler Inhale 2 puffs into the lungs every 6 (six) hours as needed for wheezing or shortness of breath.   Yes [provider]  albuterol   (PROVENTIL ) (2.5 MG/3ML) 0.083% nebulizer solution 2.5 mg every 2 (two) hours as needed for wheezing or shortness of breath.   Yes [provider]  aspirin  EC 81 MG tablet Take 81 mg by mouth daily. Swallow whole.   Yes [provider]  busPIRone  (BUSPAR ) 15 MG tablet Take 15 mg by mouth at bedtime.   Yes [provider]  celecoxib  (CELEBREX ) 200 MG capsule Take 200 mg by mouth daily.   Yes [provider]  clonazePAM  (KLONOPIN ) 1 MG tablet Take 1 mg by mouth once as needed for anxiety.   Yes [provider]  cyclobenzaprine  (FLEXERIL ) 5 MG tablet Take 1 tablet (5 mg total) by mouth 3 (three) times daily as needed for muscle spasms. Patient taking differently: Take 5 mg by mouth daily as needed for muscle spasms. 01/30/23  Yes Danton Lauraine LABOR, PA-C  divalproex  (DEPAKOTE ) 500 MG DR tablet Take 500 mg by mouth 2 (two) times daily.   Yes [provider]  EPINEPHrine  0.3 mg/0.3 mL IJ SOAJ injection Inject 0.3 mg into the muscle as needed for anaphylaxis. 08/28/21  Yes [provider]  esomeprazole (NEXIUM) 40 MG capsule Take 40 mg by mouth in the morning.   Yes [provider]  Fluticasone -Salmeterol (ADVAIR) 500-50 MCG/DOSE AEPB Inhale 2 puffs into the lungs in the morning.   Yes [provider]  furosemide  (LASIX ) 40 MG tablet Take 40 mg by mouth daily as needed for fluid or edema.   Yes [provider]  gabapentin  (NEURONTIN ) 300 MG capsule Take 300-600 mg by mouth See admin instructions. 300 mg in the morning, 600 mg at bedtime   Yes  [provider]  hydrALAZINE  (APRESOLINE ) 10 MG tablet Take 10 mg by mouth in the morning and at bedtime.   Yes [provider]  hydrochlorothiazide  (HYDRODIURIL ) 25 MG tablet Take 25 mg by mouth daily.   Yes [provider]  lisinopril  (ZESTRIL ) 40 MG tablet Take 40 mg by mouth daily. 03/25/23  Yes [provider]  metFORMIN  (GLUCOPHAGE ) 1000 MG  tablet Take 1,000 mg by mouth 2 (two) times daily with a meal.   Yes [provider]  montelukast  (SINGULAIR ) 10 MG tablet Take 10 mg by mouth at bedtime.   Yes [provider]  OVER THE COUNTER MEDICATION Take 400 mg by mouth at bedtime. Burdock root   Yes [provider]  oxyCODONE  (ROXICODONE ) 5 MG immediate release tablet Take 1 tablet (5 mg total) by mouth every 4 (four) hours as needed for severe pain (pain score 7-10). 01/30/23  Yes McClung, Sarah A, PA-C  PARoxetine  (PAXIL ) 40 MG tablet Take 40 mg by mouth every morning.   Yes [provider]  spironolactone  (ALDACTONE ) 25 MG tablet Take 25 mg by mouth at bedtime. 10/07/21  Yes [provider]  tamsulosin  (FLOMAX ) 0.4 MG CAPS capsule Take 0.4 mg by mouth at bedtime. 10/07/21  Yes [provider]  testosterone  cypionate (DEPOTESTOSTERONE CYPIONATE) 200 MG/ML injection Inject 0.5 mLs (100 mg total) into the muscle every 14 (fourteen) days. 09/17/16  Yes Nida, Gebreselassie W, MD  verapamil  (CALAN -SR) 120 MG CR tablet Take 120 mg by mouth daily.   Yes [provider]  Vitamin D , Ergocalciferol , (DRISDOL ) 1.25 MG (50000 UNIT) CAPS capsule Take 1 capsule (50,000 Units total) by mouth every Thursday. Patient taking differently: Take 50,000 Units by mouth once a week. Tuesday 02/06/23  Yes Danton Lauraine LABOR, PA-C  buPROPion (WELLBUTRIN XL) 150 MG 24 hr tablet Take 150 mg by mouth daily. Patient not taking: Reported on 07/04/2023    [provider]     Vital Signs: BP (!) 135/121 (BP Location: Right Wrist)   Pulse 86   Temp 98.2 F (36.8 C)   Resp 16   Ht 6' (1.829 m)   Wt 233 lb 11 oz (106 kg)   SpO2 97%   BMI 31.69 kg/m   Physical Exam Constitutional:      Appearance: He is obese.  Pulmonary:     Effort: Pulmonary effort is normal.  Musculoskeletal:     Comments: L arm in sling R hip drain present with scant brown fluid  Neurological:     Mental Status: He is  alert and oriented to person, place, and time.  Psychiatric:        Mood and Affect: Mood normal.        Behavior: Behavior normal.        Thought Content: Thought content normal.        Judgment: Judgment normal.     Imaging: CT FEMUR RIGHT WO CONTRAST Result Date: 10/03/2023 CLINICAL DATA:  Right thigh fluid collection, drain maintenance EXAM: CT OF THE LOWER RIGHT EXTREMITY WITHOUT CONTRAST TECHNIQUE: Multidetector CT imaging of the right lower extremity was performed according to the standard protocol. RADIATION DOSE REDUCTION: This exam was performed according to the departmental dose-optimization program which includes automated exposure control, adjustment of the mA and/or kV according to patient size and/or use of iterative reconstruction technique. COMPARISON:  09/24/2023 FINDINGS: Bones/Joint/Cartilage Stable scattered bone lucencies, see description from the 09/24/2023 exam. Stable right hip arthropathy. Prominent osteoarthritis of the right knee.  Left total knee prosthesis partially observed. Large spur like projections from the inferior patella extending into the joint. Moderate knee effusion possibly with synovitis. Suspected free osteochondral fragments in the knee joint. Ligaments N/A Muscles and Tendons Semimembranosus muscular atrophy bilaterally. Soft tissues Reduced size of the fluid collection along the superficial fascia margin of the tensor fascia lata and rectus femoris, currently measuring about 20.9 by 3.0 by 11.8 cm (volume = 390 cm^3). Previous total volume cannot be calculated as this was not completely included, but previous total volume was probably about 3 times is much. Surrounding subcutaneous edema along the right lateral thigh tracking down to the knee. There is also some subcutaneous edema medially in both thighs. Edema tracks along the superficial fascia margin of the posterior calf musculature in the upper calf region. Prominence of stool in visualized colon, cannot  exclude constipation. Atherosclerosis noted. IMPRESSION: 1. Reduced size of the fluid collection along the superficial fascia margin of the tensor fascia lata and rectus femoris, currently measuring about 20.9 by 3.0 by 11.8 cm (volume = 390 cc). Previous total volume cannot be calculated as this was not completely included, but previous total volume was probably about 3 times is much. 2. Surrounding subcutaneous edema along the right lateral thigh tracking down to the knee. There is also some subcutaneous edema medially in both thighs. Edema tracks along the superficial fascia margin of the posterior calf musculature in the upper calf region. 3. Prominent osteoarthritis of the right knee. Moderate knee effusion possibly with synovitis. Suspected free osteochondral fragments in the knee joint. 4. Prominence of stool in visualized colon, cannot exclude constipation. 5. Atherosclerosis. 6. Stable scattered bone lucencies, see description from the 09/24/2023 exam. Electronically Signed   By: Ryan Salvage M.D.   On: 10/03/2023 08:21   VAS US  LOWER EXTREMITY VENOUS (DVT) Result Date: 10/01/2023  Lower Venous DVT Study Patient Name:  Eliyah Bazzi.  Date of Exam:   10/01/2023 Medical Rec #: [id number]           Accession #:    [id number] Date of Birth: Jun 23, 1965           Patient Gender: M Patient Age:   47 years Exam Location:  [facility] Procedure:      VAS US  LOWER EXTREMITY VENOUS (DVT) Referring Phys: DELILIAH RASHID --------------------------------------------------------------------------------  Indications: Right thigh swelling, erythema, pain.  Risk Factors: Extended hospitalization with immobility (3 months). Limitations: Poor ultrasound/tissue interface. Comparison Study: LLEV on 07/18/2023 was negative for DVT. Performing Technologist: Ezzie Potters RVT, RDMS  Examination Guidelines: A complete evaluation includes B-mode imaging, spectral Doppler, color Doppler, and power Doppler as needed of all  accessible portions of each vessel. Bilateral testing is considered an integral part of a complete examination. Limited examinations for reoccurring indications may be performed as noted. The reflux portion of the exam is performed with the patient in reverse Trendelenburg.  +---------+---------------+---------+-----------+----------+-------------------+ RIGHT    CompressibilityPhasicitySpontaneityPropertiesThrombus Aging      +---------+---------------+---------+-----------+----------+-------------------+ CFV      Full           Yes      Yes                                      +---------+---------------+---------+-----------+----------+-------------------+ SFJ      Full                                                             +---------+---------------+---------+-----------+----------+-------------------+  FV Prox  Full           Yes      Yes                                      +---------+---------------+---------+-----------+----------+-------------------+ FV Mid   Full           Yes      Yes                                      +---------+---------------+---------+-----------+----------+-------------------+ FV DistalFull           Yes      Yes                                      +---------+---------------+---------+-----------+----------+-------------------+ PFV      Full                                                             +---------+---------------+---------+-----------+----------+-------------------+ POP      Full           Yes      Yes                                      +---------+---------------+---------+-----------+----------+-------------------+ PTV      None           No       No                   Age indeterminate                                                         (one of paired)     +---------+---------------+---------+-----------+----------+-------------------+ PERO     Full                                                              +---------+---------------+---------+-----------+----------+-------------------+   +---------+---------------+---------+-----------+----------+--------------+ LEFT     CompressibilityPhasicitySpontaneityPropertiesThrombus Aging +---------+---------------+---------+-----------+----------+--------------+ CFV      None           No       No                   Acute          +---------+---------------+---------+-----------+----------+--------------+ SFJ      Partial                                      Acute          +---------+---------------+---------+-----------+----------+--------------+  FV Prox  Full           Yes      Yes                                 +---------+---------------+---------+-----------+----------+--------------+ FV Mid   Full           Yes      Yes                                 +---------+---------------+---------+-----------+----------+--------------+ FV DistalFull           Yes      Yes                                 +---------+---------------+---------+-----------+----------+--------------+ PFV      None           No       No                                  +---------+---------------+---------+-----------+----------+--------------+ POP      Full           Yes      Yes                                 +---------+---------------+---------+-----------+----------+--------------+ PTV      Full                                                        +---------+---------------+---------+-----------+----------+--------------+ PERO     Full                                                        +---------+---------------+---------+-----------+----------+--------------+ Proximal CFV compresses, has color, and phasic flow. Mid and distal CFV have occlusive thrombus    Summary: BILATERAL: -No evidence of popliteal cyst, bilaterally. RIGHT: - Findings consistent with age indeterminate deep vein thrombosis  involving the right posterior tibial veins.   LEFT: - Findings consistent with acute deep vein thrombosis involving the left common femoral vein, SF junction, and left femoral vein.  - The common femoral vein obstruction does not appear to extend above inguinal ligament.  *See table(s) above for measurements and observations. Electronically signed by Gaile New MD on 10/01/2023 at 5:11:34 PM.    Final    IR GASTROSTOMY TUBE REMOVAL Result Date: 10/01/2023 INDICATION: Gastrostomy tube is no longer needed. EXAM: GASTROSTOMY TUBE REMOVAL MEDICATIONS: None ANESTHESIA/SEDATION: None CONTRAST:  None FLUOROSCOPY: None COMPLICATIONS: None immediate. PROCEDURE: Informed written consent was obtained from the patient after a thorough discussion of the procedural risks, benefits and alternatives. All questions were addressed. A timeout was performed prior to the initiation of the procedure. Balloon retention 18 French gastrostomy tube was intact. 5 cc syringe was used to remove 5 cc of saline from balloon. Gastrostomy tube was removed without  difficulty. Site was cleaned and dressed appropriately. IMPRESSION: Successful removal of 18 French balloon retention gastrostomy tube. Performed by Carlin Griffon, PA-C Electronically Signed   By: Juliene Balder M.D.   On: 10/01/2023 16:40   US  RT LOWER EXTREM LTD SOFT TISSUE NON VASCULAR Result Date: 09/30/2023 CLINICAL DATA:  Evaluate for abscess. EXAM: ULTRASOUND RIGHT LOWER EXTREMITY LIMITED TECHNIQUE: Ultrasound examination of the lower extremity soft tissues was performed in the area of clinical concern. COMPARISON:  None Available. FINDINGS: In the region of palpable abnormality in the lateral aspect of the right thigh there is a well-circumscribed is 8.5 x 2.3 x 6.8 cm fluid collection in the subcutaneous tissues. This is 1.8 cm deep to the skin. There is no abnormal vascularity. There is no soft tissue mass. IMPRESSION: 8.5 cm fluid collection in the subcutaneous tissues of  the lateral aspect of the right thigh. This may represent a seroma or abscess. Electronically Signed   By: Greig Pique M.D.   On: 09/30/2023 21:47   DG Knee 1-2 Views Right Result Date: 09/29/2023 CLINICAL DATA:  Knee pain and swelling EXAM: RIGHT KNEE - 1-2 VIEW COMPARISON:  X-ray 01/18/2019 FINDINGS: Moderate joint effusion. Moderate joint space loss of the medial compartment and patellofemoral joint. Tricompartmental osteophytes are seen greatest of the patellofemoral joint. Chondrocalcinosis also identified. There is medial translation of the femur relative to the tibia as well. Preserved bone mineralization. IMPRESSION: Advanced tricompartmental degenerative changes. Moderate joint effusion. Chondrocalcinosis Electronically Signed   By: Ranell Bring M.D.   On: 09/29/2023 15:57    Labs:  CBC: Recent Labs    09/28/23 1634 09/29/23 1006 10/02/23 0848 10/03/23 0719  WBC 4.3 3.9* 4.4 3.8*  HGB 8.3* 8.4* 8.9* 8.9*  HCT 25.6* 25.9* 27.0* 27.1*  PLT 293 298 392 363    COAGS: Recent Labs    07/05/23 2227 07/07/23 2030 07/10/23 0411 07/26/23 0423 08/13/23 0908 10/02/23 1456 10/02/23 2305 10/03/23 0719  INR 2.1* 1.9*  --   --  1.0  --   --   --   APTT  --  44*   < > 36  --  43* 39* 52*   < > = values in this interval not displayed.    BMP: Recent Labs    09/29/23 1006 10/01/23 0016 10/02/23 0848 10/03/23 0719  NA 130* 130* 134* 133*  K 4.3 4.6 4.0 3.9  CL 98 99 102 99  CO2 22 20* 21* 23  GLUCOSE 134* 121* 138* 91  BUN 46* 48* 38* 32*  CALCIUM  8.6* 8.4* 8.8* 8.6*  CREATININE 0.94 1.12 0.93 0.82  GFRNONAA >60 >60 >60 >60    LIVER FUNCTION TESTS: Recent Labs    09/27/23 0931 10/01/23 0016 10/02/23 0848 10/03/23 0719  BILITOT 0.4 0.6 0.3 0.5  AST 9* 10* 11* 9*  ALT 6 6 6 7   ALKPHOS 71 79 78 77  PROT 5.3* 5.2* 5.4* 5.3*  ALBUMIN  1.9* 2.0* 1.9* 2.0*    Assessment and Plan:  A noncon CT was ordered 7/17. The fluid volume appears to overall be decreasing  which is encouraging. The CT imaging did not adequately capture drain positioning per review by Dr. Johann. On physical exam, the drain present R hip, ext suture intact, ~50cc brown, thin fluid in well charged bulb. Insertion site erythema has improved from previous exam.   Continue to measure output daily, change dressings every 2-3 days.   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 Dr. Hughes notes that sclerosing agent may benefit patient in future. Will discuss this avenue further at a later time.  Output by Drain (mL) 10/01/23 0701 - 10/01/23 1900 10/01/23 1901 - 10/02/23 0700 10/02/23 0701 - 10/02/23 1900 10/02/23 1901 - 10/03/23 0700 10/03/23 0701 - 10/03/23 1541  Closed System Drain Right Buttock Bulb (JP) 10 Fr. 45  50        Electronically Signed: Laymon Coast, NP 10/03/2023, 3:41 PM   I spent a total of 15 Minutes at the the patient's bedside AND on the patient's hospital floor or unit, greater than 50% of which was counseling/coordinating care for existing R hip drain maintenance.

## 2023-10-03 NOTE — Progress Notes (Signed)
 Progress Note   Patient: Frank [name]. FMW:[id number] DOB: 07/24/1965 DOA: 07/03/2023     92 DOS: the patient was seen and examined on 10/03/2023   Brief hospital course: 58 year old PMH OSA CPAP, pathological fracture and multiple ORIF left Humerus likely due to multiple myeloma, A-fib not on anticoagulation, hyperlipidemia, tobacco use disorder who was feeling weak for 1 week, admitted to [facility] with concern of sepsis, community-acquired pneumonia and acute encephalopathy. Patient was found to have lytic lesions, he was transferred to [facility] for oncology treatment. In the meantime he developed encephalopathy, acute renal failure. He remained in the ICU for 36 days. Underwent tracheostomy. He was also started on HD after CRRT,off of HD now. Subsequently PEG tube placed 5/29 and IR drain placement of right thigh hip infected hematoma on 7/11 . S/p PEG tube removal. Needs Port-A-Cath placement (for outpatient chemo), either during this hospitalization or as an outpatient. F/u IR recommendations for drain management. Started on heparin  drip on 7/17 because of b/l LE DVT.  Assessment and Plan: Closed fracture of left distal humerus History of Pathological fracture and multiple ORIF left Humerus 01/2023   Infected Right Thigh Hematoma:   Underwent IR drain placement for infected Hematoma on 7/11.  Continue Linezolid  (will start 7/17) and Augmentin  x 2 weeks as per ID (Dr Dennise). Vanc dced (high trough). Follow cultures-no growth.  CT right hip done on 7/17.  Sepsis in the setting of Staph Epi Bacteremia - Resolved [facility] course complicated by intermittent fevers, ID consulted and most recently evaluated patient 6/17.   -He had a fever and MRSE bacteremia 1/4 bottles, completed a course of vancomycin  with the end date 6/20.  He did receive a line holiday.  Repeat blood culture 6/9 without growth. - He has unstageable sacral ulcer, continue local wound care.   Right thigh swelling: US   soft tissue showed fluid collection over lateral right thigh, abscess vs seroma.   B/l LE DVT: US  venous doppler done on 7/16, positive for DVT in his RLE PTV, left CFV, SFJ, and PFV. Started on heparin  drip on the morning of 7/17. I discussed in length with the patient about the risks and benefits of anticoagulation including life-threatening bleeding and possibly death.  He made an informed decision to be started on anticoagulation.  I spoke to him that once he is off of heparin  drip and no more surgical procedures are planned, he will be transitioned to oral Eliquis  on discharge.  I also spoke to him that if he develops bleeding from a site then we will have to stop the anticoagulation and he may need an IVC filter at that time.   Multiple myeloma   Initiated chemotherapy on 08/21/2023. Initially given Velcade /Cytoxan . Now on Sarclisa /Kyprolis /Cytoxan .   -- Cycle 2 chemotherapy will be done as an out patient. --Needs Port-A-cath placement, likely closer to discharge or as an outpatient. - Myeloma responding well to treatment, IgG decreased from 11,507 to WNL 1470. -- Dr [name] on board.   Left sided pleuritic chest wall pain: Resolved now No rib fractures seen on CXR Prn analgesics   Aspergillosis with pneumonia -On Voriconazole  per ID.  End date for voriconazole  is 8/5, continue.   Acute Hypoxic Respiratory Failure -  trach removed 6/11.  Respiratory status stable. No acute issues.   Severe acute metabolic encephalopathy with hypoactive delirium, seizure disorder - Normal MRI of the brain.  LP negative for meningitis or encephalitis.  EEG negative for seizures but encephalopathy.  -  Resolved   Insulin -dependent diabetes type 2- A1c 6.0, continue with sliding scale insulin .     AKI on CKD stage IIIA, Uremia,  Hyponatremia, Hypokalemia -Due to MM, hypercalcemia and ACE inhibitors?  Nephrology consulted, was on hemodialysis but eventually he tolerated being off HD, tunneled catheter  discontinued per nephrology.  Renal function is stable.   Paroxysmal A-fib - Unable to  anticoagulate due to hematoma.  Rate controlled   Obstructive Sleep Apnea -Trach removed. CPAP    Anxiety/Bipolar disorder - Continue with Paxil .     Severe protein calorie Malnutrition - Advanced to regular diet.  PEG tube was removed on 10/01/2023.   History of Pathological fracture and multiple ORIF left Humerus 01/2023: -Dr. Jonel discussed with orthopedics on 6/26, options would involve either a substantial reconstruction with antibiotic cement and prolonged, difficult recovery, or above elbow amputation.  [name] recommends more rehabilitation at this time prior to attempting surgery. - Maintain left arm splint when out of bed - [name] following, since he is not clear when he will discharge likely to have surgery here in a week or two.      Subjective: No acute events overnight. Denies chest pain or shortness of breath.  Physical Exam: Vitals:   10/03/23 0451 10/03/23 0500 10/03/23 0752 10/03/23 0840  BP: (!) 152/102  (!) 129/114   Pulse: 91  89 88  Resp: 18  16 19   Temp: 97.8 F (36.6 C)  98.3 F (36.8 C)   TempSrc: Oral     SpO2: 100%  97% 98%  Weight:  106 kg    Height:       Constitutional: NAD, calm, comfortable Eyes: PERRL, lids and conjunctivae normal ENMT: Mucous membranes are moist. Posterior pharynx clear of any exudate or lesions.Normal dentition.  Neck: normal, supple, no masses, no thyromegaly, scar from trach removal site Respiratory: clear to auscultation bilaterally, no wheezing, no crackles. Normal respiratory effort. No accessory muscle use.  Cardiovascular: Regular rate and rhythm, no murmurs / rubs / gallops. B/l LE extremity pitting edema, L>R. 2+ pedal pulses. No carotid bruits.  Abdomen: no tenderness, no masses palpated. No hepatosplenomegaly. Bowel sounds positive. Musculoskeletal: Unable to move left arm. Left arm is edematous. Swollen right lateral  thigh with no erythema or discharge. Right sided hip catheter in place. Skin: no rashes, lesions, ulcers. No induration. Right hip drain in place Neurologic: Unable to move left arm, no other acute neuro deficits elsewhere Psychiatric: Normal judgment and insight. Alert and oriented x 3. Normal mood.     Data Reviewed:  There are no new results to review at this time.  Family Communication: None at the bedside  Disposition: Status is: Inpatient Remains inpatient appropriate because: Physical deconditioning, sepsis  Planned Discharge Destination: Skilled nursing facility    Time spent: 41 minutes  Author: Deliliah Room, MD 10/03/2023 10:29 AM  For on call review [email or web address].

## 2023-10-03 NOTE — Progress Notes (Signed)
 PHARMACY - ANTICOAGULATION CONSULT NOTE  Pharmacy Consult for IV heparin  Indication: DVT  Allergies  Allergen Reactions   Morphine  And Codeine Anaphylaxis   Shellfish Allergy Anaphylaxis   Betadine  [Povidone Iodine ] Itching   Bupropion Other (See Comments)    Shaking of the body, hallucinations    Chlorhexidine  Hives    Patient reports never had issues CHG with mouth rinse.   Influenza Vaccines Hives   Metoprolol  Rash    Patient Measurements: Height: 6' (182.9 cm) Weight: 100.9 kg (222 lb 7.1 oz) IBW/kg (Calculated) : 77.6 HEPARIN  DW (KG): 104.1  Vital Signs: Temp: 97.7 F (36.5 C) (07/17 2149) Temp Source: Oral (07/17 2149) BP: 152/98 (07/17 2149) Pulse Rate: 88 (07/17 2149)  Labs: Recent Labs    10/01/23 0016 10/02/23 0848 10/02/23 1456 10/02/23 2305  HGB  --  8.9*  --   --   HCT  --  27.0*  --   --   PLT  --  392  --   --   APTT  --   --  43* 39*  HEPARINUNFRC  --   --  0.54 0.28*  CREATININE 1.12 0.93  --   --     Estimated Creatinine Clearance: 105.1 mL/min (by C-G formula based on SCr of 0.93 mg/dL).   Assessment: 58 YO male with new DVT despite previous enoxaparin  50 mg daily. Patient received one dose of apixaban  10mg  on PM of 7/16; pharmacy consulted to dose heparin  given plans for surgery in near future.   aPTT remains subtherapeutic at 39 sec, heparin  level not correlating at 0.28. No issues with line or bleeding reported per RN.  Goal of Therapy:  Heparin  level 0.3-0.7 units/ml aPTT 66-102 seconds Monitor platelets by anticoagulation protocol: Yes   Plan:  Increase heparin  infusion to 1950 units/hr Check aPTT and heparin  level in 6 hours - hopefully correlating soon  Vito Ralph, PharmD, BCPS Please see amion for complete clinical pharmacist phone list 10/03/23 12:12 AM

## 2023-10-03 NOTE — Plan of Care (Signed)
  Problem: Education: Goal: Knowledge of General Education information will improve Description: Including pain rating scale, medication(s)/side effects and non-pharmacologic comfort measures Outcome: Progressing   Problem: Health Behavior/Discharge Planning: Goal: Ability to manage health-related needs will improve Outcome: Progressing   Problem: Clinical Measurements: Goal: Ability to maintain clinical measurements within normal limits will improve Outcome: Progressing Goal: Will remain free from infection Outcome: Progressing Goal: Diagnostic test results will improve Outcome: Progressing Goal: Respiratory complications will improve Outcome: Progressing Goal: Cardiovascular complication will be avoided Outcome: Progressing   Problem: Activity: Goal: Risk for activity intolerance will decrease Outcome: Progressing   Problem: Nutrition: Goal: Adequate nutrition will be maintained Outcome: Progressing   Problem: Coping: Goal: Level of anxiety will decrease Outcome: Progressing   Problem: Elimination: Goal: Will not experience complications related to bowel motility Outcome: Progressing Goal: Will not experience complications related to urinary retention Outcome: Progressing   Problem: Pain Managment: Goal: General experience of comfort will improve and/or be controlled Outcome: Progressing   Problem: Safety: Goal: Ability to remain free from injury will improve Outcome: Progressing   Problem: Skin Integrity: Goal: Risk for impaired skin integrity will decrease Outcome: Progressing   Problem: Education: Goal: Ability to describe self-care measures that may prevent or decrease complications (Diabetes Survival Skills Education) will improve Outcome: Progressing Goal: Individualized Educational Video(s) Outcome: Progressing   Problem: Coping: Goal: Ability to adjust to condition or change in health will improve Outcome: Progressing   Problem: Fluid  Volume: Goal: Ability to maintain a balanced intake and output will improve Outcome: Progressing   Problem: Health Behavior/Discharge Planning: Goal: Ability to identify and utilize available resources and services will improve Outcome: Progressing Goal: Ability to manage health-related needs will improve Outcome: Progressing   Problem: Metabolic: Goal: Ability to maintain appropriate glucose levels will improve Outcome: Progressing   Problem: Nutritional: Goal: Maintenance of adequate nutrition will improve Outcome: Progressing Goal: Progress toward achieving an optimal weight will improve Outcome: Progressing   Problem: Skin Integrity: Goal: Risk for impaired skin integrity will decrease Outcome: Progressing   Problem: Tissue Perfusion: Goal: Adequacy of tissue perfusion will improve Outcome: Progressing   Problem: Education: Goal: Knowledge about tracheostomy care/management will improve Outcome: Progressing   Problem: Activity: Goal: Ability to tolerate increased activity will improve Outcome: Progressing   Problem: Health Behavior/Discharge Planning: Goal: Ability to manage tracheostomy will improve Outcome: Progressing   Problem: Respiratory: Goal: Patent airway maintenance will improve Outcome: Progressing   Problem: Role Relationship: Goal: Ability to communicate will improve Outcome: Progressing   Problem: Education: Goal: Knowledge of disease and its progression will improve Outcome: Progressing Goal: Individualized Educational Video(s) Outcome: Progressing   Problem: Fluid Volume: Goal: Compliance with measures to maintain balanced fluid volume will improve Outcome: Progressing   Problem: Health Behavior/Discharge Planning: Goal: Ability to manage health-related needs will improve Outcome: Progressing   Problem: Nutritional: Goal: Ability to make healthy dietary choices will improve Outcome: Progressing   Problem: Clinical  Measurements: Goal: Complications related to the disease process, condition or treatment will be avoided or minimized Outcome: Progressing

## 2023-10-03 NOTE — Progress Notes (Signed)
 This chaplain is present with the Pt. for F/U spiritual care.  The chaplain listened reflectively as the Pt. shared this week's celebrations and concerns.   The Pt. invited the chaplain to his community baptism Monday afternoon. The Pt. shared his faith is telling him not to give up. The chaplain will follow up on the Pt. interest in Bible apps with the capacity to read scripture to the Pt. The Pt. is hopeful his faith community will be a source of support for himself and family as the family navigates this hospital admission.   The chaplain learned the Pt. hope is closely tied to his personal goal to attend his daughter's wedding and spend time with his grandchildren. The Pt. listened as the chaplain provided education on the supportive role of Palliative Care in reaching his goals. The chaplain understands the Pt. will discuss a re-consult to Palliative Care this weekend with his wife..   The chaplain understands the Pt. is anticipating transfer to [facility] today. People entering the Pt. room without an invitation is not acceptable to the Pt. The chaplain understands the Pt. requested patient care after breakfast this morning and is still waiting to be cleaned up after a bowel movement. The chaplain listened reflectively as the Pt. made his own connection with cleaning and healing of his sacral wounds.   Today's spiritual care visit ended with OT-Ricky's presence and willingness to clean the Pt. before therapy.  The Pt. accepted the chaplain's intercessory prayer and F/U on Monday.  Chaplain Leeroy Hummer [phone]

## 2023-10-03 NOTE — Progress Notes (Signed)
 Occupational Therapy Treatment Patient Details Name: Frank [name]. MRN: [id number] DOB: June 12, 1965 Today's Date: 10/03/2023   History of present illness 58 y.o. male transferred from [facility] 07/03/23 to [facility] for management of newly diagnosed plasma cell myeloma with weakness and AMS. Pt also with AKI and metabolic encephalopathy. 5/10 transfer to [facility] for iHD. 4/22 Intubated and chemo initiated. 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 5/6 IVIG started. 5/8-6/11 trach. 5/12 iHD initiated. 5/29 PEG placed. 6/5 chemo initiated. PMHx:Lt THA, carpal tunnel syndrome, Lt TKA, Lt humerus fx, PAF, HTN, HLD, bipolar disorder, obesity, T2DM   OT comments  STAR Patient OT treatment: Patient received in bed and asking for assistance with cleaning following having a bowel movement. Patient performed rolling with max assist and total assist for cleaning. Cleaning required increased time due to amount of cleaning and patient fatigues from side lying. Patient assisted with gown change and positioning in bed. Patient instructed in BUE HEP with level one therapy bands. Acute OT to continue to follow with [facility].       If plan is discharge home, recommend the following:  Two people to help with walking and/or transfers;Assistance with cooking/housework;Assistance with feeding;Direct supervision/assist for medications management;Direct supervision/assist for financial management;Assist for transportation;Help with stairs or ramp for entrance;A lot of help with bathing/dressing/bathroom   Equipment Recommendations  Other (comment) (TBD)    Recommendations for Other Services      Precautions / Restrictions Precautions Precautions: Fall Recall of Precautions/Restrictions: Impaired Precaution/Restrictions Comments: episode of syncope, sacral wound, chemo. Lt humerus non-union, splinted. no elbow ROM, but shoulder ROM ok per secure chat with Dr. Jonel 7/1 Required Braces or Orthoses:  Sling Restrictions Weight Bearing Restrictions Per Provider Order: Yes LUE Weight Bearing Per Provider Order: Non weight bearing Other Position/Activity Restrictions: Lt humerus non-union, splinted. no elbow ROM, but shoulder ROM ok per secure chat with Dr. Jonel 7/1       Mobility Bed Mobility Overal bed mobility: Needs Assistance Bed Mobility: Rolling Rolling: Max assist, Used rails         General bed mobility comments: patient performed rolling side to side in bed with use of bed rail and max assist    Transfers                         Balance                                           ADL either performed or assessed with clinical judgement   ADL Overall ADL's : Needs assistance/impaired Eating/Feeding: Set up;Bed level Eating/Feeding Details (indicate cue type and reason): required assistance with opening containers Grooming: Wash/dry hands;Wash/dry face;Set up;Bed level       Lower Body Bathing: Total assistance;Bed level Lower Body Bathing Details (indicate cue type and reason): patient performed rolling in bed to provide cleaning following bowel movement Upper Body Dressing : Moderate assistance;Bed level Upper Body Dressing Details (indicate cue type and reason): to change gown                   General ADL Comments: treatment focused on LB bathing due to bowel movement    Extremity/Trunk Assessment              Vision       Perception     Praxis  Communication Communication Communication: Impaired Factors Affecting Communication: Reduced clarity of speech   Cognition Arousal: Alert Behavior During Therapy: WFL for tasks assessed/performed Cognition: Cognition impaired     Awareness: Intellectual awareness intact   Attention impairment (select first level of impairment): Sustained attention Executive functioning impairment (select all impairments): Problem solving                    Following commands: Impaired Following commands impaired: Follows one step commands with increased time      Cueing   Cueing Techniques: Verbal cues, Tactile cues, Visual cues  Exercises Exercises: General Upper Extremity General Exercises - Upper Extremity Shoulder Flexion: AROM, Right, 10 reps, Supine Shoulder Horizontal ABduction: Strengthening, Right, 10 reps, Supine, Theraband Theraband Level (Shoulder Horizontal Abduction): Level 1 (Yellow) Elbow Flexion: Strengthening, Right, 10 reps, Supine, Theraband Theraband Level (Elbow Flexion): Level 1 (Yellow) Elbow Extension: Strengthening, Right, 10 reps, Supine, Theraband Theraband Level (Elbow Extension): Level 1 (Yellow)    Shoulder Instructions       General Comments VSS on RA    Pertinent Vitals/ Pain       Pain Assessment Pain Assessment: Faces Faces Pain Scale: Hurts even more Pain Location: left shoulder and Right knee Pain Descriptors / Indicators: Discomfort, Grimacing, Sharp, Moaning Pain Intervention(s): Limited activity within patient's tolerance, Monitored during session, Repositioned  Home Living                                          Prior Functioning/Environment              Frequency   (STAR Program)        Progress Toward Goals  OT Goals(current goals can now be found in the care plan section)  Progress towards OT goals: Progressing toward goals  Acute Rehab OT Goals Patient Stated Goal: to go home OT Goal Formulation: With patient Time For Goal Achievement: 10/09/23 Potential to Achieve Goals: Fair ADL Goals Pt Will Perform Grooming: with supervision;with set-up;sitting Pt Will Perform Upper Body Dressing: with contact guard assist;sitting Pt Will Transfer to Toilet: bedside commode Pt/caregiver will Perform Home Exercise Program: Right Upper extremity;Increased strength;With written HEP provided;Independently Additional ADL Goal #1: Pt will complete bed  mobility with mod A as a precursor to EOB ADLs  Plan      Co-evaluation                 AM-PAC OT 6 Clicks Daily Activity     Outcome Measure   Help from another person eating meals?: A Little Help from another person taking care of personal grooming?: A Little Help from another person toileting, which includes using toliet, bedpan, or urinal?: Total Help from another person bathing (including washing, rinsing, drying)?: A Lot Help from another person to put on and taking off regular upper body clothing?: A Lot Help from another person to put on and taking off regular lower body clothing?: Total 6 Click Score: 12    End of Session    OT Visit Diagnosis: Unsteadiness on feet (R26.81);Other abnormalities of gait and mobility (R26.89);Muscle weakness (generalized) (M62.81);Pain Pain - Right/Left: Right Pain - part of body: Knee (and left shoulder)   Activity Tolerance Patient tolerated treatment well   Patient Left in bed;with call bell/phone within reach   Nurse Communication Mobility status        Time: 8680-8581 OT Time Calculation (  min): 59 min  Charges: OT General Charges $OT Visit: 1 Visit OT Treatments $Self Care/Home Management : 38-52 mins $Therapeutic Exercise: 8-22 mins  [name], OTA Acute Rehabilitation Services  Office [phone]   [name] 10/03/2023, 2:34 PM

## 2023-10-03 NOTE — Progress Notes (Signed)
 Physical Therapy Wound Treatment Patient Details  Name: Frank [name]. MRN: [id number] Date of Birth: Aug 30, 1965  Today's Date: 10/03/2023 Time: 8475-8449 Time Calculation (min): 26 min  Subjective  Subjective Assessment Patient and Family Stated Goals: to be able to see his grandson Date of Onset:  (exact date unknown) Prior Treatments: Sacral foam  Pain Score:    Wound Assessment  Pressure Injury 07/11/23 Sacrum Mid Unstageable - Full thickness tissue loss in which the base of the injury is covered by slough (yellow, tan, gray, green or brown) and/or eschar (tan, brown or black) in the wound bed. (Active)  Wound Image   09/30/23 1600  Dressing Type Gauze (Comment);Santyl ;ABD;Barrier Film (skin prep) 10/03/23 1559  Dressing Changed 10/03/23 1559  Dressing Change Frequency Daily 10/03/23 1559  State of Healing Early/partial granulation 10/03/23 1559  Site / Wound Assessment Clean;Yellow;Pink;Pale;Granulation tissue 10/03/23 1559  % Wound base Red or Granulating 45% 10/03/23 1559  % Wound base Yellow/Fibrinous Exudate 55% 10/03/23 1559  % Wound base Black/Eschar 0% 10/03/23 1559  % Wound base Other/Granulation Tissue (Comment) 0% 10/03/23 1559  Peri-wound Assessment Erythema (blanchable);Maceration 10/03/23 1559  Wound Length (cm) 5.1 cm 09/30/23 1600  Wound Width (cm) 4.2 cm 09/30/23 1600  Wound Depth (cm) 0.6 cm 09/30/23 1600  Wound Surface Area (cm^2) 21.42 cm^2 09/30/23 1600  Wound Volume (cm^3) 12.85 cm^3 09/30/23 1600  Margins Unattached edges (unapproximated) 10/03/23 1559  Drainage Amount Moderate 10/03/23 1559  Drainage Description Green;Serous 10/03/23 1559  Treatment Cleansed;Debridement (Selective);Packing (Saline gauze);Other (Comment) 10/03/23 1559   Wound Assessment and Plan  Wound Therapy - Assess/Plan/Recommendations Wound Therapy - Clinical Statement: Zara appears to have improved the wound status, but the dressing was change withing the hour from clean up  of stool so not much exudate to assess. Wound Therapy - Functional Problem List: decreased mobility, cancer and chemo Factors Delaying/Impairing Wound Healing: Diabetes Mellitus, Multiple medical problems, Immobility, Polypharmacy Hydrotherapy Plan: Debridement, Dressing change, Patient/family education Wound Therapy - Frequency: 2X / week Wound Therapy - Current Recommendations: OT, PT, Case manager/social work Wound Therapy - Follow Up Recommendations: dressing changes by RN  Wound Therapy Goals- Improve the function of patient's integumentary system by progressing the wound(s) through the phases of wound healing (inflammation - proliferation - remodeling) by: Wound Therapy Goals - Improve the function of patient's integumentary system by progressing the wound(s) through the phases of wound healing by: Decrease Necrotic Tissue to: 50 Decrease Necrotic Tissue - Progress: Progressing toward goal Increase Granulation Tissue to: 30 Increase Granulation Tissue - Progress: Progressing toward goal Decrease Length/Width/Depth by (cm): L and W by 0.5 cm Decrease Length/Width/Depth - Progress: Progressing toward goal Goals/treatment plan/discharge plan were made with and agreed upon by patient/family: Yes Time For Goal Achievement: 7 days Wound Therapy - Potential for Goals: Fair  Goals will be updated until maximal potential achieved or discharge criteria met.  Discharge criteria: when goals achieved, discharge from hospital, MD decision/surgical intervention, no progress towards goals, refusal/missing three consecutive treatments without notification or medical reason.  GP     Charges PT Wound Care Charges $Wound Debridement up to 20 cm: < or equal to 20 cm $PT Hydrotherapy Dressing: 1 dressing $PT Hydrotherapy Visit: 1 Visit       Vinie GAILS Victoire Deans 10/03/2023, 4:06 PM 10/03/2023  India HERO., PT Acute Rehabilitation Services [phone]  (office)

## 2023-10-03 NOTE — Progress Notes (Signed)
 PHARMACY - ANTICOAGULATION CONSULT NOTE  Pharmacy Consult for IV heparin  Indication: DVT  Allergies  Allergen Reactions   Morphine  And Codeine Anaphylaxis   Shellfish Allergy Anaphylaxis   Betadine  [Povidone Iodine ] Itching   Bupropion Other (See Comments)    Shaking of the body, hallucinations    Chlorhexidine  Hives    Patient reports never had issues CHG with mouth rinse.   Influenza Vaccines Hives   Metoprolol  Rash    Patient Measurements: Height: 6' (182.9 cm) Weight: 106 kg (233 lb 11 oz) IBW/kg (Calculated) : 77.6 HEPARIN  DW (KG): 104.1  Vital Signs: Temp: 98.2 F (36.8 C) (07/18 1224) Temp Source: Oral (07/18 0451) BP: 135/121 (07/18 1224) Pulse Rate: 86 (07/18 1224)  Labs: Recent Labs    10/01/23 0016 10/02/23 0848 10/02/23 1456 10/02/23 1456 10/02/23 2305 10/03/23 0719 10/03/23 1554  HGB  --  8.9*  --   --   --  8.9*  --   HCT  --  27.0*  --   --   --  27.1*  --   PLT  --  392  --   --   --  363  --   APTT  --   --  43*  --  39* 52*  --   HEPARINUNFRC  --   --  0.54   < > 0.28* 0.32 0.19*  CREATININE 1.12 0.93  --   --   --  0.82  --    < > = values in this interval not displayed.    Estimated Creatinine Clearance: 122.1 mL/min (by C-G formula based on SCr of 0.82 mg/dL).   Assessment: 58 YO male with new DVT despite previous enoxaparin  50 mg daily. Patient received one dose of apixaban  10mg  on PM of 7/16; pharmacy consulted to dose heparin  given plans for surgery in near future.   aPTT remains subtherapeutic at 52 sec, heparin  level is therapeutic at 0.32 - they appear to be correlating so will monitor heparin  levels going forward. No bleeding reported, Hgb and platelets stable.  PM Follow up: HL 0.19, subtherapeutic despite increased dose. Per RN, line has not be kinked and infusion has not been held, previous HL possibly still affected by DOAC given aPTT was subtherapeutic.   Goal of Therapy:  Heparin  level 0.3-0.7 units/ml Monitor platelets  by anticoagulation protocol: Yes   Plan:  Increase heparin  infusion to 2200 units/hr Check heparin  level in 6 hours Daily heparin  level, CBC Monitor for s/sx of bleeding  Thank you for involving pharmacy in this patient's care.  Randall Call, PharmD, BCPS 10/03/23 4:51 PM

## 2023-10-03 NOTE — Progress Notes (Signed)
 PHARMACY - ANTICOAGULATION CONSULT NOTE  Pharmacy Consult for IV heparin  Indication: DVT  Allergies  Allergen Reactions   Morphine  And Codeine Anaphylaxis   Shellfish Allergy Anaphylaxis   Betadine  [Povidone Iodine ] Itching   Bupropion Other (See Comments)    Shaking of the body, hallucinations    Chlorhexidine  Hives    Patient reports never had issues CHG with mouth rinse.   Influenza Vaccines Hives   Metoprolol  Rash    Patient Measurements: Height: 6' (182.9 cm) Weight: 106 kg (233 lb 11 oz) IBW/kg (Calculated) : 77.6 HEPARIN  DW (KG): 104.1  Vital Signs: Temp: 98.3 F (36.8 C) (07/18 0752) Temp Source: Oral (07/18 0451) BP: 129/114 (07/18 0752) Pulse Rate: 89 (07/18 0752)  Labs: Recent Labs    10/01/23 0016 10/02/23 0848 10/02/23 1456 10/02/23 2305 10/03/23 0719  HGB  --  8.9*  --   --  8.9*  HCT  --  27.0*  --   --  27.1*  PLT  --  392  --   --  363  APTT  --   --  43* 39* 52*  HEPARINUNFRC  --   --  0.54 0.28* 0.32  CREATININE 1.12 0.93  --   --  0.82    Estimated Creatinine Clearance: 122.1 mL/min (by C-G formula based on SCr of 0.82 mg/dL).   Assessment: 58 YO male with new DVT despite previous enoxaparin  50 mg daily. Patient received one dose of apixaban  10mg  on PM of 7/16; pharmacy consulted to dose heparin  given plans for surgery in near future.   aPTT remains subtherapeutic at 52 sec, heparin  level is therapeutic at 0.32 - they appear to be correlating so will monitor heparin  levels going forward. No bleeding reported, Hgb and platelets stable.  Goal of Therapy:  Heparin  level 0.3-0.7 units/ml Monitor platelets by anticoagulation protocol: Yes   Plan:  Increase heparin  infusion to 2050 units/hr to keep heparin  level >0.3 Check heparin  level in 6 hours Daily heparin  level, CBC Monitor for s/sx of bleeding  Thank you for involving pharmacy in this patient's care.  Delon Sax, PharmD, BCPS Clinical Pharmacist Clinical phone for  10/03/2023 is [phone] 10/03/2023 8:32 AM

## 2023-10-03 NOTE — Plan of Care (Signed)
   Problem: Education: Goal: Knowledge of General Education information will improve Description Including pain rating scale, medication(s)/side effects and non-pharmacologic comfort measures Outcome: Progressing

## 2023-10-04 DIAGNOSIS — R531 Weakness: Secondary | ICD-10-CM | POA: Diagnosis not present

## 2023-10-04 DIAGNOSIS — A419 Sepsis, unspecified organism: Secondary | ICD-10-CM | POA: Diagnosis not present

## 2023-10-04 DIAGNOSIS — N1831 Chronic kidney disease, stage 3a: Secondary | ICD-10-CM | POA: Diagnosis not present

## 2023-10-04 DIAGNOSIS — C9000 Multiple myeloma not having achieved remission: Secondary | ICD-10-CM | POA: Diagnosis not present

## 2023-10-04 LAB — CBC WITH DIFFERENTIAL/PLATELET
Abs Immature Granulocytes: 0.03 K/uL (ref 0.00–0.07)
Basophils Absolute: 0 K/uL (ref 0.0–0.1)
Basophils Relative: 1 %
Eosinophils Absolute: 0.1 K/uL (ref 0.0–0.5)
Eosinophils Relative: 1 %
HCT: 26.9 % — ABNORMAL LOW (ref 39.0–52.0)
Hemoglobin: 8.7 g/dL — ABNORMAL LOW (ref 13.0–17.0)
Immature Granulocytes: 1 %
Lymphocytes Relative: 33 %
Lymphs Abs: 1.6 K/uL (ref 0.7–4.0)
MCH: 34.8 pg — ABNORMAL HIGH (ref 26.0–34.0)
MCHC: 32.3 g/dL (ref 30.0–36.0)
MCV: 107.6 fL — ABNORMAL HIGH (ref 80.0–100.0)
Monocytes Absolute: 1 K/uL (ref 0.1–1.0)
Monocytes Relative: 20 %
Neutro Abs: 2.2 K/uL (ref 1.7–7.7)
Neutrophils Relative %: 44 %
Platelets: 421 K/uL — ABNORMAL HIGH (ref 150–400)
RBC: 2.5 MIL/uL — ABNORMAL LOW (ref 4.22–5.81)
RDW: 19.3 % — ABNORMAL HIGH (ref 11.5–15.5)
WBC: 4.8 K/uL (ref 4.0–10.5)
nRBC: 0 % (ref 0.0–0.2)

## 2023-10-04 LAB — COMPREHENSIVE METABOLIC PANEL WITH GFR
ALT: 6 U/L (ref 0–44)
AST: 9 U/L — ABNORMAL LOW (ref 15–41)
Albumin: 2 g/dL — ABNORMAL LOW (ref 3.5–5.0)
Alkaline Phosphatase: 78 U/L (ref 38–126)
Anion gap: 7 (ref 5–15)
BUN: 30 mg/dL — ABNORMAL HIGH (ref 6–20)
CO2: 24 mmol/L (ref 22–32)
Calcium: 8.5 mg/dL — ABNORMAL LOW (ref 8.9–10.3)
Chloride: 102 mmol/L (ref 98–111)
Creatinine, Ser: 1.01 mg/dL (ref 0.61–1.24)
GFR, Estimated: >60 mL/min (ref >60)
Glucose, Bld: 93 mg/dL (ref 70–99)
Potassium: 4.3 mmol/L (ref 3.5–5.1)
Sodium: 133 mmol/L — ABNORMAL LOW (ref 135–145)
Total Bilirubin: 0.6 mg/dL (ref 0.0–1.2)
Total Protein: 5.2 g/dL — ABNORMAL LOW (ref 6.5–8.1)

## 2023-10-04 LAB — GLUCOSE, CAPILLARY
Glucose-Capillary: 83 mg/dL (ref 70–99)
Glucose-Capillary: 160 mg/dL — ABNORMAL HIGH (ref 70–99)
Glucose-Capillary: 109 mg/dL — ABNORMAL HIGH (ref 70–99)
Glucose-Capillary: 108 mg/dL — ABNORMAL HIGH (ref 70–99)

## 2023-10-04 LAB — HEPARIN LEVEL (UNFRACTIONATED)
Heparin Unfractionated: 0.23 [IU]/mL — ABNORMAL LOW (ref 0.30–0.70)
Heparin Unfractionated: 0.45 [IU]/mL (ref 0.30–0.70)

## 2023-10-04 MED ORDER — JUVEN PO PACK
1.0000 | PACK | Freq: Two times a day (BID) | ORAL | Status: DC
  Administered 2023-10-04 – 2023-11-07 (×65): 1 via ORAL
  Filled 2023-10-04 (×46): qty 1

## 2023-10-04 NOTE — Plan of Care (Signed)
  Problem: Education: Goal: Knowledge of General Education information will improve Description: Including pain rating scale, medication(s)/side effects and non-pharmacologic comfort measures Outcome: Progressing   Problem: Health Behavior/Discharge Planning: Goal: Ability to manage health-related needs will improve Outcome: Progressing   Problem: Clinical Measurements: Goal: Ability to maintain clinical measurements within normal limits will improve Outcome: Progressing Goal: Will remain free from infection Outcome: Progressing Goal: Diagnostic test results will improve Outcome: Progressing Goal: Respiratory complications will improve Outcome: Progressing Goal: Cardiovascular complication will be avoided Outcome: Progressing   Problem: Activity: Goal: Risk for activity intolerance will decrease Outcome: Progressing   Problem: Nutrition: Goal: Adequate nutrition will be maintained Outcome: Progressing   Problem: Coping: Goal: Level of anxiety will decrease Outcome: Progressing   Problem: Elimination: Goal: Will not experience complications related to bowel motility Outcome: Progressing Goal: Will not experience complications related to urinary retention Outcome: Progressing   Problem: Pain Managment: Goal: General experience of comfort will improve and/or be controlled Outcome: Progressing   Problem: Safety: Goal: Ability to remain free from injury will improve Outcome: Progressing   Problem: Skin Integrity: Goal: Risk for impaired skin integrity will decrease Outcome: Progressing   Problem: Education: Goal: Ability to describe self-care measures that may prevent or decrease complications (Diabetes Survival Skills Education) will improve Outcome: Progressing Goal: Individualized Educational Video(s) Outcome: Progressing   Problem: Coping: Goal: Ability to adjust to condition or change in health will improve Outcome: Progressing   Problem: Fluid  Volume: Goal: Ability to maintain a balanced intake and output will improve Outcome: Progressing   Problem: Health Behavior/Discharge Planning: Goal: Ability to identify and utilize available resources and services will improve Outcome: Progressing Goal: Ability to manage health-related needs will improve Outcome: Progressing   Problem: Metabolic: Goal: Ability to maintain appropriate glucose levels will improve Outcome: Progressing   Problem: Nutritional: Goal: Maintenance of adequate nutrition will improve Outcome: Progressing Goal: Progress toward achieving an optimal weight will improve Outcome: Progressing   Problem: Skin Integrity: Goal: Risk for impaired skin integrity will decrease Outcome: Progressing   Problem: Tissue Perfusion: Goal: Adequacy of tissue perfusion will improve Outcome: Progressing   Problem: Education: Goal: Knowledge about tracheostomy care/management will improve Outcome: Progressing   Problem: Activity: Goal: Ability to tolerate increased activity will improve Outcome: Progressing   Problem: Health Behavior/Discharge Planning: Goal: Ability to manage tracheostomy will improve Outcome: Progressing   Problem: Respiratory: Goal: Patent airway maintenance will improve Outcome: Progressing   Problem: Role Relationship: Goal: Ability to communicate will improve Outcome: Progressing   Problem: Education: Goal: Knowledge of disease and its progression will improve Outcome: Progressing Goal: Individualized Educational Video(s) Outcome: Progressing   Problem: Fluid Volume: Goal: Compliance with measures to maintain balanced fluid volume will improve Outcome: Progressing   Problem: Health Behavior/Discharge Planning: Goal: Ability to manage health-related needs will improve Outcome: Progressing   Problem: Nutritional: Goal: Ability to make healthy dietary choices will improve Outcome: Progressing   Problem: Clinical  Measurements: Goal: Complications related to the disease process, condition or treatment will be avoided or minimized Outcome: Progressing

## 2023-10-04 NOTE — Progress Notes (Signed)
 Progress Note   Patient: Frank [name]. FMW:[id number] DOB: 10-10-1965 DOA: 07/03/2023     93 DOS: the patient was seen and examined on 10/04/2023   Brief hospital course: 58 year old PMH OSA CPAP, pathological fracture and multiple ORIF left Humerus likely due to multiple myeloma, A-fib not on anticoagulation, hyperlipidemia, tobacco use disorder who was feeling weak for 1 week, admitted to [facility] health with concern of sepsis, community-acquired pneumonia and acute encephalopathy. Patient was found to have lytic lesions, he was transferred to [facility] for oncology treatment. In the meantime he developed encephalopathy, acute renal failure. He remained in the ICU for 36 days. Underwent tracheostomy. He was also started on HD after CRRT,off of HD now. Subsequently PEG tube placed 5/29 and IR drain placement of right thigh hip infected hematoma on 7/11 . S/p PEG tube removal. Needs Port-A-Cath placement (for outpatient chemo), either during this hospitalization or as an outpatient. F/u IR recommendations for drain management. Started on heparin  drip on 7/17 because of b/l LE DVT.  Assessment and Plan: Closed fracture of left distal humerus History of Pathological fracture and multiple ORIF left Humerus 01/2023   Infected Right Thigh Hematoma:   Underwent IR drain placement for infected Hematoma on 7/11.  Continue Linezolid  (will start 7/17) and Augmentin  x 2 weeks as per ID (Dr Dennise). Vanc dced (high trough). Follow cultures-no growth.  CT right hip done on 7/17, shows reduced size of fluid collection  Sepsis in the setting of Staph Epi Bacteremia - Resolved [facility] course complicated by intermittent fevers, ID consulted and most recently evaluated patient 6/17.   -He had a fever and MRSE bacteremia 1/4 bottles, completed a course of vancomycin  with the end date 6/20. He did receive a line holiday.  Repeat blood culture 6/9 without growth. - He has unstageable sacral ulcer, continue local  wound care.   Right thigh swelling: US  soft tissue showed fluid collection over lateral right thigh, abscess vs seroma.  B/l LE DVT: US  venous doppler done on 7/16, positive for DVT in his RLE PTV, left CFV, SFJ, and PFV. Started on heparin  drip on the morning of 7/17.  Patient informed of the risks and benefits of anticoagulation including life-threatening bleeding and possibly death.  He made an informed decision to be started on anticoagulation. Informed that once he is off of heparin  drip and no more surgical procedures are planned, he will be transitioned to oral Eliquis  on discharge. Aware that if he develops bleeding from a site then we will have to stop the anticoagulation and he may need an IVC filter at that time.   Multiple myeloma   Initiated chemotherapy on 08/21/2023. Initially given Velcade /Cytoxan . Now on Sarclisa /Kyprolis /Cytoxan .   -- Cycle 2 chemotherapy will be done as an out patient. --Needs Port-A-cath placement, likely closer to discharge or as an outpatient. - Myeloma responding well to treatment, IgG decreased from 11,507 to WNL 1470. -- Dr [name] on board.   Left sided pleuritic chest wall pain: Resolved now No rib fractures seen on CXR Prn analgesics   Aspergillosis with pneumonia -On Voriconazole  per ID.  End date for voriconazole  is 8/5, continue.   Acute Hypoxic Respiratory Failure -  trach removed 6/11.  Respiratory status stable. No acute issues.   Severe acute metabolic encephalopathy with hypoactive delirium, seizure disorder - Normal MRI of the brain.  LP negative for meningitis or encephalitis.  EEG negative for seizures but encephalopathy.  -Resolved   Insulin -dependent diabetes type 2- A1c 6.0,  continue with sliding scale insulin .     AKI on CKD stage IIIA, Uremia,  Hyponatremia, Hypokalemia -Due to MM, hypercalcemia and ACE inhibitors? Nephrology consulted, was on hemodialysis but eventually he tolerated being off HD, tunneled catheter discontinued per  nephrology. Renal function is stable. Hypokalemia resolved, sodium improved to 133.   Paroxysmal A-fib -on IV heparin .  Rate controlled   Obstructive Sleep Apnea -Trach removed. CPAP at bedtime   Anxiety/Bipolar disorder - Continue with Paxil .     Severe protein calorie Malnutrition - Advanced to regular diet.  PEG tube was removed on 10/01/2023.   History of Pathological fracture and multiple ORIF left Humerus 01/2023: -Dr. Jonel discussed with orthopedics on 6/26, options would involve either a substantial reconstruction with antibiotic cement and prolonged, difficult recovery, or above elbow amputation.  [name] recommends more rehabilitation at this time prior to attempting surgery. - Maintain left arm splint when out of bed - [name] following, since he is not clear when he will discharge likely to have surgery here in a week or two.      Subjective: No acute events overnight. Patient falling out of bed during my evaluation. Reports he has call for help and currently waiting for someone to come in. Still has mild discomfort in his legs but no other complaints.  Physical Exam: Vitals:   10/04/23 0010 10/04/23 0026 10/04/23 0337 10/04/23 0752  BP: (!) 149/91 (!) 149/91 (!) 135/99 (!) 147/95  Pulse: 88 88 86 89  Resp: 18 18 18 18   Temp: (!) 97.3 F (36.3 C) (!) 97.3 F (36.3 C) 97.8 F (36.6 C) 98.4 F (36.9 C)  TempSrc: Oral Oral Oral   SpO2: 98% 98% 99% 94%  Weight:      Height:       General: Pleasant, well-appearing obese man laying in bed. No acute distress. HEENT: [place]/AT. Anicteric sclera CV: RRR. No murmurs, rubs, or gallops.  Pulmonary: Lungs CTAB. Normal effort. No wheezing or rales.  Decreased air movement throughout. Abdominal: Soft, nontender, nondistended. Normal bowel sounds. Extremities: 1+ BLE pitting edema, L>R. Mild tenderness to palpation of the lower extremities. Skin: Warm and dry. No obvious rash or lesions. Neuro: A&Ox3. Difficulty with moving  left arm. Normal sensation to light touch.  Psych: Normal mood and affect  Data Reviewed:    Latest Ref Rng & Units 10/04/2023    3:28 AM 10/03/2023    7:19 AM 10/02/2023    8:48 AM  CBC  WBC 4.0 - 10.5 K/uL 4.8  3.8  4.4   Hemoglobin 13.0 - 17.0 g/dL 8.7  8.9  8.9   Hematocrit 39.0 - 52.0 % 26.9  27.1  27.0   Platelets 150 - 400 K/uL 421  363  392       Latest Ref Rng & Units 10/04/2023    3:28 AM 10/03/2023    7:19 AM 10/02/2023    8:48 AM  CMP  Glucose 70 - 99 mg/dL 93  91  861   BUN 6 - 20 mg/dL 30  32  38   Creatinine 0.61 - 1.24 mg/dL 8.98  9.17  9.06   Sodium 135 - 145 mmol/L 133  133  134   Potassium 3.5 - 5.1 mmol/L 4.3  3.9  4.0   Chloride 98 - 111 mmol/L 102  99  102   CO2 22 - 32 mmol/L 24  23  21    Calcium  8.9 - 10.3 mg/dL 8.5  8.6  8.8  Total Protein 6.5 - 8.1 g/dL 5.2  5.3  5.4   Total Bilirubin 0.0 - 1.2 mg/dL 0.6  0.5  0.3   Alkaline Phos 38 - 126 U/L 78  77  78   AST 15 - 41 U/L 9  9  11    ALT 0 - 44 U/L 6  7  6      Family Communication: No family at bedside  Disposition: Status is: Inpatient Remains inpatient appropriate because: Physical deconditioning, sepsis  Planned Discharge Destination: Skilled nursing facility    Time spent: 38  minutes  Author: Claretta CHRISTELLA Alderman, MD 10/04/2023 8:03 AM  For on call review [email or web address].

## 2023-10-04 NOTE — Progress Notes (Signed)
 PHARMACY - ANTICOAGULATION CONSULT NOTE  Pharmacy Consult for IV heparin  Indication: DVT  Allergies  Allergen Reactions   Morphine  And Codeine Anaphylaxis   Shellfish Allergy Anaphylaxis   Betadine  [Povidone Iodine ] Itching   Bupropion Other (See Comments)    Shaking of the body, hallucinations    Chlorhexidine  Hives    Patient reports never had issues CHG with mouth rinse.   Influenza Vaccines Hives   Metoprolol  Rash    Patient Measurements: Height: 6' (182.9 cm) Weight: 106 kg (233 lb 11 oz) IBW/kg (Calculated) : 77.6 HEPARIN  DW (KG): 104.1  Vital Signs: Temp: 98.9 F (37.2 C) (07/19 1207) Temp Source: Oral (07/19 1207) BP: 147/96 (07/19 1207) Pulse Rate: 98 (07/19 1207)  Labs: Recent Labs    10/02/23 0848 10/02/23 1456 10/02/23 1456 10/02/23 2305 10/03/23 0719 10/03/23 1554 10/04/23 0005 10/04/23 0328 10/04/23 0709  HGB 8.9*  --   --   --  8.9*  --   --  8.7*  --   HCT 27.0*  --   --   --  27.1*  --   --  26.9*  --   PLT 392  --   --   --  363  --   --  421*  --   APTT  --  43*  --  39* 52*  --   --   --   --   HEPARINUNFRC  --  0.54   < > 0.28* 0.32 0.19* 0.23*  --  0.45  CREATININE 0.93  --   --   --  0.82  --   --  1.01  --    < > = values in this interval not displayed.    Estimated Creatinine Clearance: 99.1 mL/min (by C-G formula based on SCr of 1.01 mg/dL).   Assessment: 58 YO male with new DVT despite previous enoxaparin  50 mg daily. Patient received one dose of apixaban  10mg  on PM of 7/16; pharmacy consulted to dose heparin  given plans for surgery in near future.   Heparin  level is therapeutic at 0.45 at 2500 units/hr. No bleeding or issues with infusion noted, Hgb and platelets stable.  Goal of Therapy:  Heparin  level 0.3-0.7 units/ml Monitor platelets by anticoagulation protocol: Yes   Plan:  Continue heparin  2500 units/hr Monitor heparin  level, CBC, and s/sx of bleeding daily Follow-up plan to transition to apixaban  at  discharge.  Maegan Shahidah Nesbitt, PharmD, RPh PGY-1 Pharmacy Resident [facility] 10/04/2023 2:17 PM

## 2023-10-04 NOTE — Progress Notes (Signed)
 PHARMACY - ANTICOAGULATION CONSULT NOTE  Pharmacy Consult for IV heparin  Indication: DVT  Allergies  Allergen Reactions   Morphine  And Codeine Anaphylaxis   Shellfish Allergy Anaphylaxis   Betadine  [Povidone Iodine ] Itching   Bupropion Other (See Comments)    Shaking of the body, hallucinations    Chlorhexidine  Hives    Patient reports never had issues CHG with mouth rinse.   Influenza Vaccines Hives   Metoprolol  Rash    Patient Measurements: Height: 6' (182.9 cm) Weight: 106 kg (233 lb 11 oz) IBW/kg (Calculated) : 77.6 HEPARIN  DW (KG): 104.1  Vital Signs: Temp: 98.9 F (37.2 C) (07/19 1207) Temp Source: Oral (07/19 1207) BP: 147/96 (07/19 1207) Pulse Rate: 98 (07/19 1207)  Labs: Recent Labs    10/02/23 0848 10/02/23 1456 10/02/23 1456 10/02/23 2305 10/03/23 0719 10/03/23 1554 10/04/23 0005 10/04/23 0328 10/04/23 0709  HGB 8.9*  --   --   --  8.9*  --   --  8.7*  --   HCT 27.0*  --   --   --  27.1*  --   --  26.9*  --   PLT 392  --   --   --  363  --   --  421*  --   APTT  --  43*  --  39* 52*  --   --   --   --   HEPARINUNFRC  --  0.54   < > 0.28* 0.32 0.19* 0.23*  --  0.45  CREATININE 0.93  --   --   --  0.82  --   --  1.01  --    < > = values in this interval not displayed.    Estimated Creatinine Clearance: 99.1 mL/min (by C-G formula based on SCr of 1.01 mg/dL).   Assessment: 58 YO male with new DVT despite previous enoxaparin  50 mg daily. Patient received one dose of apixaban  10mg  on PM of 7/16; pharmacy consulted to dose heparin  given plans for surgery in near future.   Heparin  level 0.45 now therapeutic on 4500 units/hr.  Hgb 8.7, pltc 421.  No s/sx bleeding, infusion issues noted.   Goal of Therapy:  Heparin  level 0.3-0.7 units/ml Monitor platelets by anticoagulation protocol: Yes   Plan:  Continue heparin  IV 2500 units/hr Daily heparin  level, CBC Monitor for s/sx of bleeding F/u to PO after surgery  Thank you for involving pharmacy in  this patient's care.  Maurilio Fila, PharmD Clinical Pharmacist 10/04/2023  2:45 PM

## 2023-10-04 NOTE — Progress Notes (Signed)
 PHARMACY - ANTICOAGULATION CONSULT NOTE  Pharmacy Consult for heparin  Indication: DVT  Labs: Recent Labs    10/02/23 0848 10/02/23 1456 10/02/23 1456 10/02/23 2305 10/03/23 0719 10/03/23 1554 10/04/23 0005  HGB 8.9*  --   --   --  8.9*  --   --   HCT 27.0*  --   --   --  27.1*  --   --   PLT 392  --   --   --  363  --   --   APTT  --  43*  --  39* 52*  --   --   HEPARINUNFRC  --  0.54   < > 0.28* 0.32 0.19* 0.23*  CREATININE 0.93  --   --   --  0.82  --   --    < > = values in this interval not displayed.   Assessment: 58yo male remains subtherapeutic on heparin  after rate change; no infusion issues or signs of bleeding per RN.  Goal of Therapy:  Heparin  level 0.3-0.7 units/ml   Plan:  Increase heparin  infusion by 2-3 units/kg/hr to 2500 units/hr. Check level in 6 hours.   Marvetta Dauphin, PharmD, BCPS 10/04/2023 1:02 AM

## 2023-10-05 DIAGNOSIS — A419 Sepsis, unspecified organism: Secondary | ICD-10-CM | POA: Diagnosis not present

## 2023-10-05 DIAGNOSIS — C9000 Multiple myeloma not having achieved remission: Secondary | ICD-10-CM | POA: Diagnosis not present

## 2023-10-05 DIAGNOSIS — N1831 Chronic kidney disease, stage 3a: Secondary | ICD-10-CM | POA: Diagnosis not present

## 2023-10-05 DIAGNOSIS — R531 Weakness: Secondary | ICD-10-CM | POA: Diagnosis not present

## 2023-10-05 LAB — GLUCOSE, CAPILLARY
Glucose-Capillary: 142 mg/dL — ABNORMAL HIGH (ref 70–99)
Glucose-Capillary: 118 mg/dL — ABNORMAL HIGH (ref 70–99)
Glucose-Capillary: 120 mg/dL — ABNORMAL HIGH (ref 70–99)

## 2023-10-05 LAB — CBC WITH DIFFERENTIAL/PLATELET
Abs Immature Granulocytes: 0.02 K/uL (ref 0.00–0.07)
Basophils Absolute: 0 K/uL (ref 0.0–0.1)
Basophils Relative: 1 %
Eosinophils Absolute: 0.1 K/uL (ref 0.0–0.5)
Eosinophils Relative: 2 %
HCT: 27.4 % — ABNORMAL LOW (ref 39.0–52.0)
Hemoglobin: 8.9 g/dL — ABNORMAL LOW (ref 13.0–17.0)
Immature Granulocytes: 1 %
Lymphocytes Relative: 31 %
Lymphs Abs: 1.4 K/uL (ref 0.7–4.0)
MCH: 34.9 pg — ABNORMAL HIGH (ref 26.0–34.0)
MCHC: 32.5 g/dL (ref 30.0–36.0)
MCV: 107.5 fL — ABNORMAL HIGH (ref 80.0–100.0)
Monocytes Absolute: 0.9 K/uL (ref 0.1–1.0)
Monocytes Relative: 19 %
Neutro Abs: 2 K/uL (ref 1.7–7.7)
Neutrophils Relative %: 46 %
Platelets: 432 K/uL — ABNORMAL HIGH (ref 150–400)
RBC: 2.55 MIL/uL — ABNORMAL LOW (ref 4.22–5.81)
RDW: 19.7 % — ABNORMAL HIGH (ref 11.5–15.5)
WBC: 4.4 K/uL (ref 4.0–10.5)
nRBC: 0 % (ref 0.0–0.2)

## 2023-10-05 LAB — COMPREHENSIVE METABOLIC PANEL WITH GFR
ALT: 6 U/L (ref 0–44)
AST: 8 U/L — ABNORMAL LOW (ref 15–41)
Albumin: 2 g/dL — ABNORMAL LOW (ref 3.5–5.0)
Alkaline Phosphatase: 84 U/L (ref 38–126)
Anion gap: 10 (ref 5–15)
BUN: 27 mg/dL — ABNORMAL HIGH (ref 6–20)
CO2: 24 mmol/L (ref 22–32)
Calcium: 8.4 mg/dL — ABNORMAL LOW (ref 8.9–10.3)
Chloride: 99 mmol/L (ref 98–111)
Creatinine, Ser: 1.03 mg/dL (ref 0.61–1.24)
GFR, Estimated: >60 mL/min (ref >60)
Glucose, Bld: 85 mg/dL (ref 70–99)
Potassium: 4.7 mmol/L (ref 3.5–5.1)
Sodium: 133 mmol/L — ABNORMAL LOW (ref 135–145)
Total Bilirubin: 0.2 mg/dL (ref 0.0–1.2)
Total Protein: 5.3 g/dL — ABNORMAL LOW (ref 6.5–8.1)

## 2023-10-05 LAB — HEPARIN LEVEL (UNFRACTIONATED)
Heparin Unfractionated: 0.27 [IU]/mL — ABNORMAL LOW (ref 0.30–0.70)
Heparin Unfractionated: 0.37 [IU]/mL (ref 0.30–0.70)
Heparin Unfractionated: 0.39 [IU]/mL (ref 0.30–0.70)

## 2023-10-05 MED ORDER — IPRATROPIUM-ALBUTEROL 0.5-2.5 (3) MG/3ML IN SOLN
3.0000 mL | Freq: Two times a day (BID) | RESPIRATORY_TRACT | Status: DC
  Administered 2023-10-05 – 2023-10-09 (×8): 3 mL via RESPIRATORY_TRACT
  Filled 2023-10-05 (×9): qty 3

## 2023-10-05 MED ORDER — HEPARIN BOLUS VIA INFUSION
1500.0000 [IU] | Freq: Once | INTRAVENOUS | Status: AC
  Administered 2023-10-05: 1500 [IU] via INTRAVENOUS
  Filled 2023-10-05: qty 1500

## 2023-10-05 NOTE — Progress Notes (Signed)
 Progress Note   Patient: Frank [name]. FMW:[id number] DOB: 06/11/1965 DOA: 07/03/2023     94 DOS: the patient was seen and examined on 10/05/2023   Brief hospital course: 58 year old PMH OSA CPAP, pathological fracture and multiple ORIF left Humerus likely due to multiple myeloma, A-fib not on anticoagulation, hyperlipidemia, tobacco use disorder who was feeling weak for 1 week, admitted to [facility] health with concern of sepsis, community-acquired pneumonia and acute encephalopathy. Patient was found to have lytic lesions, he was transferred to [facility] for oncology treatment. In the meantime he developed encephalopathy, acute renal failure. He remained in the ICU for 36 days. Underwent tracheostomy. He was also started on HD after CRRT,off of HD now. Subsequently PEG tube placed 5/29 and IR drain placement of right thigh hip infected hematoma on 7/11 . S/p PEG tube removal. Needs Port-A-Cath placement (for outpatient chemo), either during this hospitalization or as an outpatient. F/u IR recommendations for drain management. Started on heparin  drip on 7/17 because of b/l LE DVT.  Assessment and Plan: Closed fracture of left distal humerus History of Pathological fracture and multiple ORIF left Humerus 01/2023   Infected Right Thigh Hematoma:   Underwent IR drain placement for infected Hematoma on 7/11.  Continue Linezolid  (will start 7/17) and Augmentin  x 2 weeks as per ID (Dr Dennise). Vanc dced (high trough). Follow cultures-no growth.  CT right hip done on 7/17, shows reduced size of fluid collection  Sepsis in the setting of Staph Epi Bacteremia - Resolved [facility] course complicated by intermittent fevers, ID consulted and most recently evaluated patient 6/17.   -He had a fever and MRSE bacteremia 1/4 bottles, completed a course of vancomycin  with the end date 6/20. He did receive a line holiday.  Repeat blood culture 6/9 without growth. - He has unstageable sacral ulcer, continue local  wound care.   Right thigh swelling, improved: US  soft tissue showed fluid collection over lateral right thigh, abscess vs seroma.  B/l LE DVT: US  venous doppler done on 7/16, positive for DVT in his RLE PTV, left CFV, SFJ, and PFV. Started on heparin  drip on the morning of 7/17.  Patient informed of the risks and benefits of anticoagulation including life-threatening bleeding and possibly death.  He made an informed decision to be started on anticoagulation. Informed that once he is off of heparin  drip and no more surgical procedures are planned, he will be transitioned to oral Eliquis  on discharge. Aware that if he develops bleeding from a site then we will have to stop the anticoagulation and he may need an IVC filter at that time.   Multiple myeloma   Initiated chemotherapy on 08/21/2023. Initially given Velcade /Cytoxan . Now on Sarclisa /Kyprolis /Cytoxan .   -- Cycle 2 chemotherapy will be done as an out patient. --Needs Port-A-cath placement, likely closer to discharge or as an outpatient. - Myeloma responding well to treatment, IgG decreased from 11,507 to WNL 1470. -- Dr [name] on board.   Left sided pleuritic chest wall pain: Resolved now No rib fractures seen on CXR Prn analgesics   Aspergillosis with pneumonia -On Voriconazole  per ID.  End date for voriconazole  is 8/5, continue.   Acute Hypoxic Respiratory Failure -  trach removed 6/11. Respiratory status stable. No acute issues.   Severe acute metabolic encephalopathy with hypoactive delirium, seizure disorder - Normal MRI of the brain. LP negative for meningitis or encephalitis.  EEG negative for seizures but encephalopathy.  -Resolved  Hypertension -his BP remains elevated with SBP in  the 130s to 150s.  Kidney function stable.  Resume home lisinopril .  Watch closely and resume other BP meds for better BP control   Insulin -dependent diabetes type 2- A1c 6.0, continue with sliding scale insulin .     AKI on CKD stage IIIA, Uremia,   Hyponatremia, Hypokalemia -Due to MM, hypercalcemia and ACE inhibitors? Nephrology consulted, was on hemodialysis but eventually he tolerated being off HD, tunneled catheter discontinued per nephrology. Renal function is stable. Hypokalemia resolved, sodium improved to 133.   Paroxysmal A-fib -on IV heparin . Rate controlled   Obstructive Sleep Apnea -Trach removed. CPAP at bedtime   Anxiety/Bipolar disorder - Continue with Paxil .     Severe protein calorie Malnutrition - Advanced to regular diet.  PEG tube was removed on 10/01/2023.   History of Pathological fracture and multiple ORIF left Humerus 01/2023: -Dr. Jonel discussed with orthopedics on 6/26, options would involve either a substantial reconstruction with antibiotic cement and prolonged, difficult recovery, or above elbow amputation. [name] recommends more rehabilitation at this time prior to attempting surgery. - Maintain left arm splint when out of bed - [name] following, since he is not clear when he will discharge likely to have surgery here in a week or two.      Subjective: Patient moved to a different floor per his request. States another patient was coming into his room.  He has no acute complaints. Reports the nurses at this floor are very attentive to his needs. His spouse is coming to visit so they can have lunch together. No fevers, chills, abdominal pain, nausea or vomiting.  Physical Exam: Vitals:   10/05/23 0423 10/05/23 0736 10/05/23 0836 10/05/23 1136  BP: (!) 152/99 (!) 145/97  (!) 140/93  Pulse: 85 96 92 94  Resp: 16 18 18 18   Temp: 97.6 F (36.4 C) (!) 97 F (36.1 C)  97.9 F (36.6 C)  TempSrc: Oral Axillary  Axillary  SpO2: 98% 97%  97%  Weight:      Height:       General: Pleasant, well-appearing obese man laying in bed. No acute distress. HEENT: [place]/AT. Anicteric sclera CV: RRR. No murmurs, rubs, or gallops.  Pulmonary: Lungs CTAB. Normal effort. No wheezing or rales.  Decreased air  movement throughout. Abdominal: Soft, nontender, nondistended. Normal bowel sounds. Extremities: 1+ BLE pitting edema, L>R. Mild tenderness to palpation of the lower extremities. Skin: Warm and dry. No obvious rash or lesions. Neuro: A&Ox3. Difficulty with moving left arm. Normal sensation to light touch.  Psych: Normal mood and affect  Data Reviewed:    Latest Ref Rng & Units 10/05/2023    5:33 AM 10/04/2023    3:28 AM 10/03/2023    7:19 AM  CBC  WBC 4.0 - 10.5 K/uL 4.4  4.8  3.8   Hemoglobin 13.0 - 17.0 g/dL 8.9  8.7  8.9   Hematocrit 39.0 - 52.0 % 27.4  26.9  27.1   Platelets 150 - 400 K/uL 432  421  363       Latest Ref Rng & Units 10/05/2023    5:33 AM 10/04/2023    3:28 AM 10/03/2023    7:19 AM  CMP  Glucose 70 - 99 mg/dL 85  93  91   BUN 6 - 20 mg/dL 27  30  32   Creatinine 0.61 - 1.24 mg/dL 8.96  8.98  9.17   Sodium 135 - 145 mmol/L 133  133  133   Potassium 3.5 - 5.1 mmol/L  4.7  4.3  3.9   Chloride 98 - 111 mmol/L 99  102  99   CO2 22 - 32 mmol/L 24  24  23    Calcium  8.9 - 10.3 mg/dL 8.4  8.5  8.6   Total Protein 6.5 - 8.1 g/dL 5.3  5.2  5.3   Total Bilirubin 0.0 - 1.2 mg/dL 0.2  0.6  0.5   Alkaline Phos 38 - 126 U/L 84  78  77   AST 15 - 41 U/L 8  9  9    ALT 0 - 44 U/L 6  6  7      Family Communication: Discussed plan with spouse at bedside  Disposition: Status is: Inpatient Remains inpatient appropriate because: Physical deconditioning, sepsis  Planned Discharge Destination: Skilled nursing facility    Time spent: 35  minutes  Author: Claretta CHRISTELLA Alderman, MD 10/05/2023 2:35 PM  For on call review [email or web address].

## 2023-10-05 NOTE — Progress Notes (Signed)
 PHARMACY - ANTICOAGULATION CONSULT NOTE  Pharmacy Consult for heparin  Indication: DVT  Labs: Recent Labs    10/03/23 0719 10/03/23 1554 10/04/23 0328 10/04/23 0709 10/05/23 0533 10/05/23 1510 10/05/23 2243  HGB 8.9*  --  8.7*  --  8.9*  --   --   HCT 27.1*  --  26.9*  --  27.4*  --   --   PLT 363  --  421*  --  432*  --   --   APTT 52*  --   --   --   --   --   --   HEPARINUNFRC 0.32   < >  --    < > 0.27* 0.37 0.39  CREATININE 0.82  --  1.01  --  1.03  --   --    < > = values in this interval not displayed.   Assessment/Plan: 58yo male remains therapeutic on heparin .  Will continue current rate 2700 units/hr and monitor daily level.   Marvetta Dauphin, PharmD, BCPS 10/05/2023 11:15 PM

## 2023-10-05 NOTE — Progress Notes (Signed)
 PHARMACY - ANTICOAGULATION CONSULT NOTE  Pharmacy Consult for IV heparin  Indication: DVT  Allergies  Allergen Reactions   Morphine  And Codeine Anaphylaxis   Shellfish Allergy Anaphylaxis   Betadine  [Povidone Iodine ] Itching   Bupropion Other (See Comments)    Shaking of the body, hallucinations    Chlorhexidine  Hives    Patient reports never had issues CHG with mouth rinse.   Influenza Vaccines Hives   Metoprolol  Rash    Patient Measurements: Height: 6' (182.9 cm) Weight: 106 kg (233 lb 11 oz) IBW/kg (Calculated) : 77.6 HEPARIN  DW (KG): 104.1  Vital Signs: Temp: 97.6 F (36.4 C) (07/20 0423) Temp Source: Oral (07/20 0423) BP: 152/99 (07/20 0423) Pulse Rate: 85 (07/20 0423)  Labs: Recent Labs    10/02/23 1456 10/02/23 2305 10/03/23 0719 10/03/23 1554 10/04/23 0005 10/04/23 0328 10/04/23 0709 10/05/23 0533  HGB  --   --  8.9*  --   --  8.7*  --  8.9*  HCT  --   --  27.1*  --   --  26.9*  --  27.4*  PLT  --   --  363  --   --  421*  --  432*  APTT 43* 39* 52*  --   --   --   --   --   HEPARINUNFRC 0.54 0.28* 0.32   < > 0.23*  --  0.45 0.27*  CREATININE  --   --  0.82  --   --  1.01  --  1.03   < > = values in this interval not displayed.    Estimated Creatinine Clearance: 97.2 mL/min (by C-G formula based on SCr of 1.03 mg/dL).   Assessment: 59 YO male with new DVT despite previous enoxaparin  50 mg daily. Patient received one dose of apixaban  10mg  on PM of 7/16; pharmacy consulted to dose heparin  given plans for surgery in near future.   7/20: Heparin  level today (10/05/23) is subtherapeutic at 0.27 while on previous infusion rate of heparin  2500 units/hr. Per charge nurse, no signs/symptoms of bleeding and there were no pauses of infusion overnight.   Goal of Therapy:  Heparin  level 0.3-0.7 units/ml Monitor platelets by anticoagulation protocol: Yes   Plan:  Will give a bolus of 1500 units (about 15 units/kg), then increase the infusion rate to 2700  units/hr (about 2 units/kg/hr).   Daily heparin  level, CBC Monitor for s/sx of bleeding F/u to PO after surgery Check heparin  level in 6 hours.   Thank you for involving pharmacy in this patient's care.  Feliciano Close, PharmD PGY2 Infectious Diseases Pharmacy Resident   10/05/2023  7:24 AM

## 2023-10-05 NOTE — Progress Notes (Signed)
 PHARMACY - ANTICOAGULATION CONSULT NOTE  Pharmacy Consult for IV heparin  Indication: DVT  Allergies  Allergen Reactions   Morphine  And Codeine Anaphylaxis   Shellfish Allergy Anaphylaxis   Betadine  [Povidone Iodine ] Itching   Bupropion Other (See Comments)    Shaking of the body, hallucinations    Chlorhexidine  Hives    Patient reports never had issues CHG with mouth rinse.   Influenza Vaccines Hives   Metoprolol  Rash    Patient Measurements: Height: 6' (182.9 cm) Weight: 106 kg (233 lb 11 oz) IBW/kg (Calculated) : 77.6 HEPARIN  DW (KG): 104.1  Vital Signs: Temp: 97.9 F (36.6 C) (07/20 1136) Temp Source: Axillary (07/20 1136) BP: 140/93 (07/20 1136) Pulse Rate: 94 (07/20 1136)  Labs: Recent Labs    10/02/23 2305 10/02/23 2305 10/03/23 0719 10/03/23 1554 10/04/23 0328 10/04/23 0709 10/05/23 0533 10/05/23 1510  HGB  --    < > 8.9*  --  8.7*  --  8.9*  --   HCT  --   --  27.1*  --  26.9*  --  27.4*  --   PLT  --   --  363  --  421*  --  432*  --   APTT 39*  --  52*  --   --   --   --   --   HEPARINUNFRC 0.28*  --  0.32   < >  --  0.45 0.27* 0.37  CREATININE  --   --  0.82  --  1.01  --  1.03  --    < > = values in this interval not displayed.    Estimated Creatinine Clearance: 97.2 mL/min (by C-G formula based on SCr of 1.03 mg/dL).   Assessment: 58 YO male with new DVT despite previous enoxaparin  50 mg daily. Patient received one dose of apixaban  10mg  on PM of 7/16; pharmacy consulted to dose heparin  given plans for surgery in near future.   7/20: Heparin  level today (10/05/23) is subtherapeutic at 0.27 while on previous infusion rate of heparin  2500 units/hr. Per charge nurse, no signs/symptoms of bleeding and there were no pauses of infusion overnight.   7/20 2nd: HL 0.37 therapeutic on 2700 units/hr. No documented signs of bleeding or issues with heparin  infusion.   Goal of Therapy:  Heparin  level 0.3-0.7 units/ml Monitor platelets by anticoagulation  protocol: Yes   Plan:  Continue infusion rate to 2700 units/hr (about 2 units/kg/hr).   Recheck confirmatory HL in 6 hours (2200)  Daily heparin  level, CBC Monitor for s/sx of bleeding F/u to PO after surgery   Thank you for involving pharmacy in this patient's care.  Dionicia Canavan, PharmD, RPh PGY1 Acute Care Pharmacy Resident [facility]  10/05/2023 4:09 PM

## 2023-10-05 NOTE — Plan of Care (Signed)
  Problem: Education: Goal: Knowledge of General Education information will improve Description: Including pain rating scale, medication(s)/side effects and non-pharmacologic comfort measures Outcome: Progressing   Problem: Health Behavior/Discharge Planning: Goal: Ability to manage health-related needs will improve Outcome: Progressing   Problem: Clinical Measurements: Goal: Ability to maintain clinical measurements within normal limits will improve Outcome: Progressing Goal: Will remain free from infection Outcome: Progressing Goal: Diagnostic test results will improve Outcome: Progressing Goal: Respiratory complications will improve Outcome: Progressing Goal: Cardiovascular complication will be avoided Outcome: Progressing   Problem: Activity: Goal: Risk for activity intolerance will decrease Outcome: Progressing   Problem: Nutrition: Goal: Adequate nutrition will be maintained Outcome: Progressing   Problem: Coping: Goal: Level of anxiety will decrease Outcome: Progressing   Problem: Elimination: Goal: Will not experience complications related to bowel motility Outcome: Progressing Goal: Will not experience complications related to urinary retention Outcome: Progressing   Problem: Pain Managment: Goal: General experience of comfort will improve and/or be controlled Outcome: Progressing   Problem: Safety: Goal: Ability to remain free from injury will improve Outcome: Progressing   Problem: Skin Integrity: Goal: Risk for impaired skin integrity will decrease Outcome: Progressing   Problem: Education: Goal: Ability to describe self-care measures that may prevent or decrease complications (Diabetes Survival Skills Education) will improve Outcome: Progressing Goal: Individualized Educational Video(s) Outcome: Progressing   Problem: Coping: Goal: Ability to adjust to condition or change in health will improve Outcome: Progressing   Problem: Fluid  Volume: Goal: Ability to maintain a balanced intake and output will improve Outcome: Progressing   Problem: Health Behavior/Discharge Planning: Goal: Ability to identify and utilize available resources and services will improve Outcome: Progressing Goal: Ability to manage health-related needs will improve Outcome: Progressing   Problem: Metabolic: Goal: Ability to maintain appropriate glucose levels will improve Outcome: Progressing   Problem: Nutritional: Goal: Maintenance of adequate nutrition will improve Outcome: Progressing Goal: Progress toward achieving an optimal weight will improve Outcome: Progressing   Problem: Skin Integrity: Goal: Risk for impaired skin integrity will decrease Outcome: Progressing   Problem: Tissue Perfusion: Goal: Adequacy of tissue perfusion will improve Outcome: Progressing   Problem: Education: Goal: Knowledge about tracheostomy care/management will improve Outcome: Progressing   Problem: Activity: Goal: Ability to tolerate increased activity will improve Outcome: Progressing   Problem: Health Behavior/Discharge Planning: Goal: Ability to manage tracheostomy will improve Outcome: Progressing   Problem: Respiratory: Goal: Patent airway maintenance will improve Outcome: Progressing   Problem: Role Relationship: Goal: Ability to communicate will improve Outcome: Progressing   Problem: Education: Goal: Knowledge of disease and its progression will improve Outcome: Progressing Goal: Individualized Educational Video(s) Outcome: Progressing   Problem: Fluid Volume: Goal: Compliance with measures to maintain balanced fluid volume will improve Outcome: Progressing   Problem: Health Behavior/Discharge Planning: Goal: Ability to manage health-related needs will improve Outcome: Progressing   Problem: Nutritional: Goal: Ability to make healthy dietary choices will improve Outcome: Progressing   Problem: Clinical  Measurements: Goal: Complications related to the disease process, condition or treatment will be avoided or minimized Outcome: Progressing

## 2023-10-06 ENCOUNTER — Inpatient Hospital Stay (HOSPITAL_COMMUNITY)

## 2023-10-06 DIAGNOSIS — A419 Sepsis, unspecified organism: Secondary | ICD-10-CM | POA: Diagnosis not present

## 2023-10-06 DIAGNOSIS — R6521 Severe sepsis with septic shock: Secondary | ICD-10-CM | POA: Diagnosis not present

## 2023-10-06 LAB — CBC WITH DIFFERENTIAL/PLATELET
Abs Immature Granulocytes: 0.02 K/uL (ref 0.00–0.07)
Basophils Absolute: 0 K/uL (ref 0.0–0.1)
Basophils Relative: 1 %
Eosinophils Absolute: 0.1 K/uL (ref 0.0–0.5)
Eosinophils Relative: 2 %
HCT: 29.2 % — ABNORMAL LOW (ref 39.0–52.0)
Hemoglobin: 9.4 g/dL — ABNORMAL LOW (ref 13.0–17.0)
Immature Granulocytes: 0 %
Lymphocytes Relative: 24 %
Lymphs Abs: 1.2 K/uL (ref 0.7–4.0)
MCH: 34.7 pg — ABNORMAL HIGH (ref 26.0–34.0)
MCHC: 32.2 g/dL (ref 30.0–36.0)
MCV: 107.7 fL — ABNORMAL HIGH (ref 80.0–100.0)
Monocytes Absolute: 0.9 K/uL (ref 0.1–1.0)
Monocytes Relative: 18 %
Neutro Abs: 2.9 K/uL (ref 1.7–7.7)
Neutrophils Relative %: 55 %
Platelets: 441 K/uL — ABNORMAL HIGH (ref 150–400)
RBC: 2.71 MIL/uL — ABNORMAL LOW (ref 4.22–5.81)
RDW: 19.7 % — ABNORMAL HIGH (ref 11.5–15.5)
WBC: 5.2 K/uL (ref 4.0–10.5)
nRBC: 0 % (ref 0.0–0.2)

## 2023-10-06 LAB — COMPREHENSIVE METABOLIC PANEL WITH GFR
ALT: 7 U/L (ref 0–44)
AST: 10 U/L — ABNORMAL LOW (ref 15–41)
Albumin: 2.2 g/dL — ABNORMAL LOW (ref 3.5–5.0)
Alkaline Phosphatase: 89 U/L (ref 38–126)
Anion gap: 10 (ref 5–15)
BUN: 22 mg/dL — ABNORMAL HIGH (ref 6–20)
CO2: 24 mmol/L (ref 22–32)
Calcium: 8.6 mg/dL — ABNORMAL LOW (ref 8.9–10.3)
Chloride: 100 mmol/L (ref 98–111)
Creatinine, Ser: 1.01 mg/dL (ref 0.61–1.24)
GFR, Estimated: >60 mL/min (ref >60)
Glucose, Bld: 94 mg/dL (ref 70–99)
Potassium: 5.3 mmol/L — ABNORMAL HIGH (ref 3.5–5.1)
Sodium: 134 mmol/L — ABNORMAL LOW (ref 135–145)
Total Bilirubin: 0.4 mg/dL (ref 0.0–1.2)
Total Protein: 5.5 g/dL — ABNORMAL LOW (ref 6.5–8.1)

## 2023-10-06 LAB — GLUCOSE, CAPILLARY
Glucose-Capillary: 169 mg/dL — ABNORMAL HIGH (ref 70–99)
Glucose-Capillary: 87 mg/dL (ref 70–99)
Glucose-Capillary: 115 mg/dL — ABNORMAL HIGH (ref 70–99)
Glucose-Capillary: 113 mg/dL — ABNORMAL HIGH (ref 70–99)
Glucose-Capillary: 216 mg/dL — ABNORMAL HIGH (ref 70–99)

## 2023-10-06 LAB — HEPARIN LEVEL (UNFRACTIONATED): Heparin Unfractionated: 0.4 [IU]/mL (ref 0.30–0.70)

## 2023-10-06 MED ORDER — HYDRALAZINE HCL 25 MG PO TABS
25.0000 mg | ORAL_TABLET | Freq: Three times a day (TID) | ORAL | Status: DC
  Administered 2023-10-06 – 2023-11-07 (×96): 25 mg via ORAL
  Filled 2023-10-06 (×94): qty 1

## 2023-10-06 MED ORDER — FUROSEMIDE 10 MG/ML IJ SOLN
20.0000 mg | INTRAMUSCULAR | Status: AC
  Administered 2023-10-06: 20 mg via INTRAVENOUS
  Filled 2023-10-06: qty 4

## 2023-10-06 MED ORDER — IPRATROPIUM-ALBUTEROL 0.5-2.5 (3) MG/3ML IN SOLN
3.0000 mL | RESPIRATORY_TRACT | Status: DC | PRN
  Administered 2023-10-12 – 2023-11-05 (×4): 3 mL via RESPIRATORY_TRACT
  Filled 2023-10-06 (×2): qty 3

## 2023-10-06 NOTE — Progress Notes (Signed)
 Physical Therapy Treatment Patient Details Name: Frank [name]. MRN: [id number] DOB: 04-18-1965 Today's Date: 10/06/2023   History of Present Illness 58 y.o. male transferred from [facility] 07/03/23 to [facility] for management of newly diagnosed plasma cell myeloma with weakness and AMS. Pt also with AKI and metabolic encephalopathy. 5/10 transfer to [facility] for iHD. 4/22 Intubated and chemo initiated. 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 5/6 IVIG started. 5/8-6/11 trach. 5/12 iHD initiated. 5/29 PEG placed. 6/5 chemo initiated. PMHx:Lt THA, carpal tunnel syndrome, Lt TKA, Lt humerus fx, PAF, HTN, HLD, bipolar disorder, obesity, T2DM    PT Comments  STAR PT/OT Session: Pt supine in bed on entry eager to get out of bed to wheelchair. Pt able to roll with maxAx2 for putting on shorts for slide board transfer to wheelchair. Pt requires maximal cuing and maxAx2 for slide board transfer to wheelchair. Once in wheelchair pt is able to propel wheelchair with B LE and R UE. Fatigues quickly and returns to room. Returned to bed with slide board and maxAx2. Will continue to progress pt mobility and work on seating tolerance for chemotherapy at discharge.     If plan is discharge home, recommend the following: Two people to help with walking and/or transfers;Assistance with cooking/housework;Assist for transportation;Two people to help with bathing/dressing/bathroom;Direct supervision/assist for medications management;Direct supervision/assist for financial management;Help with stairs or ramp for entrance;Assistance with feeding   Can travel by private vehicle     No  Equipment Recommendations  Hospital bed;Hoyer lift;Wheelchair (measurements PT);Wheelchair cushion (measurements PT);BSC/3in1       Precautions / Restrictions Precautions Precautions: Fall Recall of Precautions/Restrictions: Impaired Precaution/Restrictions Comments: plan for amputation, Dr Kendal okay with arm in sling Required Braces or Orthoses:  Sling Restrictions Weight Bearing Restrictions Per Provider Order: Yes LUE Weight Bearing Per Provider Order: Non weight bearing Other Position/Activity Restrictions: Lt humerus non-union, splinted. no elbow ROM, but shoulder ROM ok per secure chat with Dr. Jonel 7/1     Mobility  Bed Mobility Overal bed mobility: Needs Assistance Bed Mobility: Rolling, Supine to Sit, Sit to Supine Rolling: Max assist, Used rails   Supine to sit: Max assist, HOB elevated, Used rails Sit to supine: +2 for physical assistance, Total assist   General bed mobility comments: patient assisting with getting to EOB with use of bed pad to assist. Requires total assist to return to supine    Transfers Overall transfer level: Needs assistance Equipment used: Sliding board Transfers: Bed to chair/wheelchair/BSC            Lateral/Scoot Transfers: +2 physical assistance, Max assist General transfer comment: performed slide board transfers from EOB to wheelchair with mod assist of 2 and max assisst of 2 to return to [employer] Wheelchair mobility: Yes Wheelchair propulsion: Both lower extermities Wheelchair parts: Needs assistance Distance: 30 Wheelchair Assistance Details (indicate cue type and reason): with Crocs on his feet pt is able to use bilateral feet and R UE to propel his wheelchair 30 feet with multiple rest breaks         Balance Overall balance assessment: Needs assistance Sitting-balance support: Single extremity supported, Feet supported Sitting balance-Leahy Scale: Fair Sitting balance - Comments: EOB                                    Communication Communication Communication: Impaired Factors Affecting  Communication: Reduced clarity of speech  Cognition Arousal: Alert Behavior During Therapy: WFL for tasks assessed/performed                             Following commands: Impaired Following  commands impaired: Follows one step commands with increased time    Cueing Cueing Techniques: Verbal cues, Tactile cues, Visual cues     General Comments General comments (skin integrity, edema, etc.): VSS on RA      Pertinent Vitals/Pain Pain Assessment Pain Assessment: Faces Faces Pain Scale: Hurts even more Pain Location: left shoulder and Right knee Pain Descriptors / Indicators: Discomfort, Grimacing, Sharp, Moaning Pain Intervention(s): Limited activity within patient's tolerance, Monitored during session, Repositioned     PT Goals (current goals can now be found in the care plan section) Acute Rehab PT Goals PT Goal Formulation: With patient Time For Goal Achievement: 10/09/23 Potential to Achieve Goals: Fair Progress towards PT goals: Progressing toward goals    Frequency    Min 5X/week           Co-evaluation PT/OT/SLP Co-Evaluation/Treatment: Yes Reason for Co-Treatment: For patient/therapist safety;To address functional/ADL transfers PT goals addressed during session: Mobility/safety with mobility;Balance OT goals addressed during session: ADL's and self-care      AM-PAC PT 6 Clicks Mobility   Outcome Measure  Help needed turning from your back to your side while in a flat bed without using bedrails?: A Lot Help needed moving from lying on your back to sitting on the side of a flat bed without using bedrails?: Total Help needed moving to and from a bed to a chair (including a wheelchair)?: Total Help needed standing up from a chair using your arms (e.g., wheelchair or bedside chair)?: Total Help needed to walk in hospital room?: Total Help needed climbing 3-5 steps with a railing? : Total 6 Click Score: 7    End of Session Equipment Utilized During Treatment: Gait belt Activity Tolerance: Patient limited by pain Patient left: in bed;with call bell/phone within reach Nurse Communication: Mobility status PT Visit Diagnosis: Other abnormalities of  gait and mobility (R26.89);Muscle weakness (generalized) (M62.81);Difficulty in walking, not elsewhere classified (R26.2);Unsteadiness on feet (R26.81)     Time: 1410-1502 PT Time Calculation (min) (ACUTE ONLY): 52 min  Charges:    $Wheel Chair Management: 8-22 mins PT General Charges $$ ACUTE PT VISIT: 1 Visit                     Koraline Phillipson B. Fleeta Lapidus PT, DPT Acute Rehabilitation Services Please use secure chat or  Call Office [phone]    [name] [facility] 10/06/2023, 4:39 PM

## 2023-10-06 NOTE — Progress Notes (Signed)
 Progress Note   Patient: Frank [name]. FMW:[id number] DOB: 1965-04-18 DOA: 07/03/2023     95 DOS: the patient was seen and examined on 10/06/2023   Brief hospital course: 58 year old PMH OSA CPAP, pathological fracture and multiple ORIF left Humerus likely due to multiple myeloma, A-fib not on anticoagulation, hyperlipidemia, tobacco use disorder who was feeling weak for 1 week, admitted to [facility] with concern of sepsis, community-acquired pneumonia and acute encephalopathy. Patient was found to have lytic lesions, he was transferred to [facility] for oncology treatment. In the meantime he developed encephalopathy, acute renal failure. He remained in the ICU for 36 days. Underwent tracheostomy. He was also started on HD after CRRT,off of HD now. Subsequently PEG tube placed 5/29 and IR drain placement of right thigh hip infected hematoma on 7/11 . S/p PEG tube removal. Needs Port-A-Cath placement (for outpatient chemo), either during this hospitalization or as an outpatient. F/u IR recommendations for drain management. Started on heparin  drip on 7/17 because of b/l LE DVT.   Assessment and Plan: Closed fx of L distal humerus (hx of pathologic fx and ORIF L humerus)  - Oxycodone  PRN  - Miralax  17 g PO bid  - Senna 1 tablet PO daily  - Lidoderm  5% patch   Sepsis due to staph epi bacteremia  - Zyvox  600 mg PO q12  - Augmentin  875 mg PO q12  R thigh swelling  - s/p IR drain placement for infected Hematoma on 7/11  - Antibx as above   B/l LE DVT - IV heparin  drip    MM - Appreciate oncology Dr. Maude Crease following along   Aspergillosis w/ pneumonia - Voriconazole  400 mg PO q12  - Anoro ellipta  1 puff daily  - Tessalon  100 mg PO tid PRN  - Acyclovir  400 mg PO bid    Acute hypoxic resp failure  - As above - Mucinex  600 mg PO bid  - Duoneb bid  - Singulair  5 mg PO at bedtime   Severe acute metabolic encephalopathy w/ hypoactive delirium / seizure disorder  - Valproic  acid  750 mg PO bid   HTN  - Hydralazine  25 mg pO q8hr   DM2 - Novolog  SS ACHS  - Novolog  2 units tid  - Semglee  10 units sq daily   AKI on CKD 3a with anemia  - Epogen  40,000 units sq every wed  - Ferrous sulfate  325 mg PO daily   PAF  - IV heparin  drip as above   OSA  - CPAP  Anxiety/bipolar disorder - Paxil  40 mg PO daily   Severe protein calorie malnutrition  - PROsource feeding supplement tid - Ensure plus high protein bid w/ meals   - Juven 1 packet PO bid  - MVI PO daily   Subjective: Pt seen and examined at the bedside. Continues on antibx. Oncology awaiting results of the 24 hour urine collection. Oncology planning another treatment sometime this week. He continus on antibx. PT/OT.  Physical Exam: Vitals:   10/05/23 1938 10/06/23 0418 10/06/23 0500 10/06/23 0737  BP: (!) 149/98 (!) 141/99  (!) 139/95  Pulse: 88 89  88  Resp: 16 16  17   Temp: 98.2 F (36.8 C) 99.6 F (37.6 C)  97.7 F (36.5 C)  TempSrc: Oral Oral  Oral  SpO2: 97% 98%  99%  Weight:   109 kg   Height:       Physical Exam HENT:     Head: Normocephalic.  Mouth/Throat:     Mouth: Mucous membranes are moist.  Cardiovascular:     Rate and Rhythm: Normal rate.  Pulmonary:     Effort: Pulmonary effort is normal.  Abdominal:     Palpations: Abdomen is soft.  Skin:    General: Skin is warm.  Neurological:     Mental Status: He is alert. Mental status is at baseline.  Psychiatric:        Mood and Affect: Mood normal.       Disposition: Status is: Inpatient Remains inpatient appropriate because: Antibx, further oncology treatment, PT/OT  Planned Discharge Destination: Barriers to discharge: As above     Time spent: 35 minutes  Author: ATLEE ABERNETHY , MD 10/06/2023 11:23 AM  For on call review [email or web address].

## 2023-10-06 NOTE — Progress Notes (Signed)
 Mr. Doeden is now on [facility].  He admitted yesterday.  So unsure exactly as to why he was moved.  Things need to be gone okay for him.  He still has a drain in.  This is still draining.  He will find out today whether none of the drain needs to be put in.  He has had no fever.  He has had no nausea or vomiting.  He is eating pretty well.  He has had no problems with bowels or bladder.  I am still waiting the results from the 24-hour urine.  Hopefully, we will see that his kappa light chains come down quite a bit.  At some point, going to have to get him another treatment.  Hopefully, we will be able to do that this week.  His labs today show white count of 5.2.  Hemoglobin 9.4.  Platelet count 441,000.  He has had no obvious bleeding.  He continues on heparin  for the recent thromboembolic issue.  We are still not sure as to when he will have surgery for the left arm amputation.  His vital signs show temperature 99.6.  Pulse 89.  Blood pressure 141/99.  His head and neck exam shows no ocular or oral lesions.  There are no palpable cervical or supraclavicular lymph nodes.  Lungs are clear bilaterally.  He has good air movement bilaterally.  Cardiac exam regular rate and rhythm.  He has no murmurs.  Abdomen is soft.  He has decent bowel sounds.  The site where his PEG tube was is healing.  He has no guarding or rebound tenderness.  There is no palpable liver or spleen tip.  Extremities shows no clubbing, cyanosis or edema.  Neurological exam is not focal.  He continues to do pretty well.  Again, he is in need of quite a bit of physical therapy and Occupational Therapy.  Again we have to find out when the left arm will be amputated.  She is on the heparin .  I will continue heparin  for right now.  The drainage tube in the right hip also has been addressed.  We will continue to follow along.  Hopefully, he will be able to get out of here at some point.  Jeralyn Crease, MD  2 [facility]

## 2023-10-06 NOTE — TOC Progression Note (Signed)
 Transition of Care (TOC) - Progression Note    Patient Details  Name: Frank [name]. MRN: [id number] Date of Birth: 05/07/1965  Transition of [facility]) CM/SW Contact  Lauraine FORBES Saa, LCSW Phone Number: 10/06/2023, 2:05 PM  Clinical Narrative:     2:05 PM CSW expanded SNF search in and near [employer] areas in efforts to obtain a SNF bed offer close to chemotherapy location.  Expected Discharge Plan: Skilled Nursing Facility Barriers to Discharge: Continued Medical Work up  Expected Discharge Plan and Services In-house Referral: Clinical Social Work Discharge Planning Services: CM Consult Post Acute Care Choice: Skilled Nursing Facility Living arrangements for the past 2 months: Single Family Home                 DME Arranged: N/A DME Agency: NA       HH Arranged: NA HH Agency: NA         Social Determinants of Health (SDOH) Interventions SDOH Screenings   Food Insecurity: No Food Insecurity (07/05/2023)  Housing: Low Risk  (07/05/2023)  Transportation Needs: No Transportation Needs (07/05/2023)  Utilities: Not At Risk (07/05/2023)  Tobacco Use: High Risk (08/30/2023)    Readmission Risk Interventions     No data to display

## 2023-10-06 NOTE — Progress Notes (Addendum)
 This chaplain is present for F/U spiritual care. The Pt. is awake and shares sleeping well the last three nights. The chaplain listened reflectively as the Pt. communicates his plans for baptism today. The chaplain understands the Pt. pastor will arrive around 11am. The chaplain accepted the Pt. invitation to be present at this time.  ++[phone] The chaplain joined the Pt. Uncle-Rick and community clergy for Pt. requested baptism and communion.  Chaplain Leeroy Hummer [phone]

## 2023-10-06 NOTE — Plan of Care (Signed)
  Problem: Education: Goal: Knowledge of General Education information will improve Description: Including pain rating scale, medication(s)/side effects and non-pharmacologic comfort measures Outcome: Progressing   Problem: Health Behavior/Discharge Planning: Goal: Ability to manage health-related needs will improve Outcome: Progressing   Problem: Clinical Measurements: Goal: Ability to maintain clinical measurements within normal limits will improve Outcome: Progressing Goal: Will remain free from infection Outcome: Progressing Goal: Diagnostic test results will improve Outcome: Progressing Goal: Respiratory complications will improve Outcome: Progressing Goal: Cardiovascular complication will be avoided Outcome: Progressing   Problem: Activity: Goal: Risk for activity intolerance will decrease Outcome: Progressing   Problem: Nutrition: Goal: Adequate nutrition will be maintained Outcome: Progressing   Problem: Coping: Goal: Level of anxiety will decrease Outcome: Progressing   Problem: Elimination: Goal: Will not experience complications related to bowel motility Outcome: Progressing Goal: Will not experience complications related to urinary retention Outcome: Progressing   Problem: Pain Managment: Goal: General experience of comfort will improve and/or be controlled Outcome: Progressing   Problem: Safety: Goal: Ability to remain free from injury will improve Outcome: Progressing   Problem: Skin Integrity: Goal: Risk for impaired skin integrity will decrease Outcome: Progressing   Problem: Education: Goal: Ability to describe self-care measures that may prevent or decrease complications (Diabetes Survival Skills Education) will improve Outcome: Progressing Goal: Individualized Educational Video(s) Outcome: Progressing   Problem: Coping: Goal: Ability to adjust to condition or change in health will improve Outcome: Progressing   Problem: Fluid  Volume: Goal: Ability to maintain a balanced intake and output will improve Outcome: Progressing   Problem: Health Behavior/Discharge Planning: Goal: Ability to identify and utilize available resources and services will improve Outcome: Progressing Goal: Ability to manage health-related needs will improve Outcome: Progressing   Problem: Metabolic: Goal: Ability to maintain appropriate glucose levels will improve Outcome: Progressing   Problem: Nutritional: Goal: Maintenance of adequate nutrition will improve Outcome: Progressing Goal: Progress toward achieving an optimal weight will improve Outcome: Progressing   Problem: Skin Integrity: Goal: Risk for impaired skin integrity will decrease Outcome: Progressing   Problem: Tissue Perfusion: Goal: Adequacy of tissue perfusion will improve Outcome: Progressing   Problem: Education: Goal: Knowledge about tracheostomy care/management will improve Outcome: Progressing   Problem: Activity: Goal: Ability to tolerate increased activity will improve Outcome: Progressing   Problem: Health Behavior/Discharge Planning: Goal: Ability to manage tracheostomy will improve Outcome: Progressing   Problem: Respiratory: Goal: Patent airway maintenance will improve Outcome: Progressing   Problem: Role Relationship: Goal: Ability to communicate will improve Outcome: Progressing   Problem: Education: Goal: Knowledge of disease and its progression will improve Outcome: Progressing Goal: Individualized Educational Video(s) Outcome: Progressing   Problem: Fluid Volume: Goal: Compliance with measures to maintain balanced fluid volume will improve Outcome: Progressing   Problem: Health Behavior/Discharge Planning: Goal: Ability to manage health-related needs will improve Outcome: Progressing   Problem: Nutritional: Goal: Ability to make healthy dietary choices will improve Outcome: Progressing   Problem: Clinical  Measurements: Goal: Complications related to the disease process, condition or treatment will be avoided or minimized Outcome: Progressing

## 2023-10-06 NOTE — Progress Notes (Signed)
 Referring Provider(s): Montano,Emil  Supervising Physician: Karalee Beat  Patient Status:  [facility] - In-pt  Chief Complaint:  Right hip fluid collection s/p percutaneous drain placement with Dr. Jenna 09/26/23   Subjective:  Pt seen sitting up in bed, resting.  Pt states no pain at drain site. All pt questions answered.   Allergies: Morphine  and codeine, Shellfish allergy, Betadine  [povidone iodine ], Bupropion, Chlorhexidine , Influenza vaccines, and Metoprolol   Medications: Prior to Admission medications   Medication Sig Start Date End Date Taking? Authorizing Provider  albuterol  (PROVENTIL  HFA;VENTOLIN  HFA) 108 (90 Base) MCG/ACT inhaler Inhale 2 puffs into the lungs every 6 (six) hours as needed for wheezing or shortness of breath.   Yes [provider]  albuterol  (PROVENTIL ) (2.5 MG/3ML) 0.083% nebulizer solution 2.5 mg every 2 (two) hours as needed for wheezing or shortness of breath.   Yes [provider]  aspirin  EC 81 MG tablet Take 81 mg by mouth daily. Swallow whole.   Yes [provider]  busPIRone  (BUSPAR ) 15 MG tablet Take 15 mg by mouth at bedtime.   Yes [provider]  celecoxib  (CELEBREX ) 200 MG capsule Take 200 mg by mouth daily.   Yes [provider]  clonazePAM  (KLONOPIN ) 1 MG tablet Take 1 mg by mouth once as needed for anxiety.   Yes [provider]  cyclobenzaprine  (FLEXERIL ) 5 MG tablet Take 1 tablet (5 mg total) by mouth 3 (three) times daily as needed for muscle spasms. Patient taking differently: Take 5 mg by mouth daily as needed for muscle spasms. 01/30/23  Yes McClung, Sarah A, PA-C  divalproex  (DEPAKOTE ) 500 MG DR tablet Take 500 mg by mouth 2 (two) times daily.   Yes [provider]  EPINEPHrine  0.3 mg/0.3 mL IJ SOAJ injection Inject 0.3 mg into the muscle as needed for anaphylaxis. 08/28/21  Yes [provider]  esomeprazole (NEXIUM) 40 MG capsule Take 40 mg by mouth in the  morning.   Yes [provider]  Fluticasone -Salmeterol (ADVAIR) 500-50 MCG/DOSE AEPB Inhale 2 puffs into the lungs in the morning.   Yes [provider]  furosemide  (LASIX ) 40 MG tablet Take 40 mg by mouth daily as needed for fluid or edema.   Yes [provider]  gabapentin  (NEURONTIN ) 300 MG capsule Take 300-600 mg by mouth See admin instructions. 300 mg in the morning, 600 mg at bedtime   Yes [provider]  hydrALAZINE  (APRESOLINE ) 10 MG tablet Take 10 mg by mouth in the morning and at bedtime.   Yes [provider]  hydrochlorothiazide  (HYDRODIURIL ) 25 MG tablet Take 25 mg by mouth daily.   Yes [provider]  lisinopril  (ZESTRIL ) 40 MG tablet Take 40 mg by mouth daily. 03/25/23  Yes [provider]  metFORMIN  (GLUCOPHAGE ) 1000 MG tablet Take 1,000 mg by mouth 2 (two) times daily with a meal.   Yes [provider]  montelukast  (SINGULAIR ) 10 MG tablet Take 10 mg by mouth at bedtime.   Yes [provider]  OVER THE COUNTER MEDICATION Take 400 mg by mouth at bedtime. Burdock root   Yes [provider]  oxyCODONE  (ROXICODONE ) 5 MG immediate release tablet Take 1 tablet (5 mg total) by mouth every 4 (four) hours as needed for severe pain (pain score 7-10). 01/30/23  Yes Danton Lauraine LABOR, PA-C  PARoxetine  (PAXIL ) 40 MG tablet Take 40 mg by mouth every morning.   Yes [provider]  spironolactone  (ALDACTONE ) 25 MG tablet Take 25  mg by mouth at bedtime. 10/07/21  Yes [provider]  tamsulosin  (FLOMAX ) 0.4 MG CAPS capsule Take 0.4 mg by mouth at bedtime. 10/07/21  Yes [provider]  testosterone  cypionate (DEPOTESTOSTERONE CYPIONATE) 200 MG/ML injection Inject 0.5 mLs (100 mg total) into the muscle every 14 (fourteen) days. 09/17/16  Yes Nida, Gebreselassie W, MD  verapamil  (CALAN -SR) 120 MG CR tablet Take 120 mg by mouth daily.   Yes [provider]  Vitamin D , Ergocalciferol ,  (DRISDOL ) 1.25 MG (50000 UNIT) CAPS capsule Take 1 capsule (50,000 Units total) by mouth every Thursday. Patient taking differently: Take 50,000 Units by mouth once a week. Tuesday 02/06/23  Yes Danton Lauraine LABOR, PA-C  buPROPion (WELLBUTRIN XL) 150 MG 24 hr tablet Take 150 mg by mouth daily. Patient not taking: Reported on 07/04/2023    [provider]     Vital Signs: BP (!) 139/95 (BP Location: Right Arm)   Pulse 88   Temp 97.7 F (36.5 C) (Oral)   Resp 17   Ht 6' (1.829 m)   Wt 240 lb 4.8 oz (109 kg)   SpO2 99%   BMI 32.59 kg/m   Physical Exam Constitutional:      Appearance: Normal appearance.  Skin:    Comments: Right hip drain F/A easily  Skin clean, dry, without signs of infection.  Output 20 mL dark red/brown in JP   Neurological:     Mental Status: He is alert and oriented to person, place, and time.  Psychiatric:        Mood and Affect: Mood normal.        Behavior: Behavior normal.      Labs:  CBC: Recent Labs    10/03/23 0719 10/04/23 0328 10/05/23 0533 10/06/23 0614  WBC 3.8* 4.8 4.4 5.2  HGB 8.9* 8.7* 8.9* 9.4*  HCT 27.1* 26.9* 27.4* 29.2*  PLT 363 421* 432* 441*    COAGS: Recent Labs    07/05/23 2227 07/07/23 2030 07/10/23 0411 07/26/23 0423 08/13/23 0908 10/02/23 1456 10/02/23 2305 10/03/23 0719  INR 2.1* 1.9*  --   --  1.0  --   --   --   APTT  --  44*   < > 36  --  43* 39* 52*   < > = values in this interval not displayed.    BMP: Recent Labs    10/03/23 0719 10/04/23 0328 10/05/23 0533 10/06/23 0614  NA 133* 133* 133* 134*  K 3.9 4.3 4.7 5.3*  CL 99 102 99 100  CO2 23 24 24 24   GLUCOSE 91 93 85 94  BUN 32* 30* 27* 22*  CALCIUM  8.6* 8.5* 8.4* 8.6*  CREATININE 0.82 1.01 1.03 1.01  GFRNONAA >60 >60 >60 >60    LIVER FUNCTION TESTS: Recent Labs    10/03/23 0719 10/04/23 0328 10/05/23 0533 10/06/23 0614  BILITOT 0.5 0.6 0.2 0.4  AST 9* 9* 8* 10*  ALT 7 6 6 7   ALKPHOS 77 78 84 89  PROT 5.3* 5.2* 5.3*  5.5*  ALBUMIN  2.0* 2.0* 2.0* 2.2*    Assessment and Plan:  Right hip fluid collection s/p percutaneous drain placement with Dr. Jenna 09/26/23.    Drain Location: Right Hip Size: Fr size: 10 Fr Date of placement: 09/26/23  Currently to: Drain collection device: suction bulb 24 hour output:  Output by Drain (mL) 10/04/23 0701 - 10/04/23 1900 10/04/23 1901 - 10/05/23 0700 10/05/23 0701 - 10/05/23 1900 10/05/23 1901 - 10/06/23 0700 10/06/23 0701 -  10/06/23 1112  Closed System Drain Right Buttock Bulb (JP) 10 Fr.  100 40      Interval imaging/drain manipulation:   CT Femur Right WO Contrast - 10/02/23   IMPRESSION: 1. Reduced size of the fluid collection along the superficial fascia margin of the tensor fascia lata and rectus femoris, currently measuring about 20.9 by 3.0 by 11.8 cm (volume = 390 cc). Previous total volume cannot be calculated as this was not completely included, but previous total volume was probably about 3 times is much. 2. Surrounding subcutaneous edema along the right lateral thigh tracking down to the knee. There is also some subcutaneous edema medially in both thighs. Edema tracks along the superficial fascia margin of the posterior calf musculature in the upper calf region. 3. Prominent osteoarthritis of the right knee. Moderate knee effusion possibly with synovitis. Suspected free osteochondral fragments in the knee joint. 4. Prominence of stool in visualized colon, cannot exclude constipation. 5. Atherosclerosis. 6. Stable scattered bone lucencies, see description from the 09/24/2023 exam.     Electronically Signed   By: Ryan Salvage M.D.   On: 10/03/2023 08:21  Current examination: Flushes/aspirates easily.  Insertion site unremarkable. Suture and stat lock in place. Dressed appropriately.   Plan: Continue TID flushes with 5 cc NS. Record output Q shift. Dressing changes QD or PRN if soiled.  Call IR APP or on call IR MD if difficulty  flushing or sudden change in drain output.  Repeat imaging/possible drain injection once output < 10 mL/QD (excluding flush material). Consideration for drain removal if output is < 10 mL/QD (excluding flush material), pending discussion with the providing surgical service.  Discharge planning: Please contact IR APP or on call IR MD prior to patient d/c to ensure appropriate follow up plans are in place. Typically patient will follow up with IR clinic 10-14 days post d/c for repeat imaging/possible drain injection. IR scheduler will contact patient with date/time of appointment. Patient will need to flush drain QD with 5 cc NS, record output QD, dressing changes every 2-3 days or earlier if soiled.   The possibility of a Morel-Lavellee lesion cannot be excluded.  Closing drain with sclerosing agent may be necessary; will keep this in mind moving forward with subsequent imaging.    IR will continue to follow - please call with questions or concerns.  Electronically Signed: Lavanda JAYSON Jurist, PA-C 10/06/2023, 11:12 AM    I spent a total of 15 Minutes at the the patient's bedside AND on the patient's hospital floor or unit, greater than 50% of which was counseling/coordinating care for right hip abscess drain.

## 2023-10-06 NOTE — Progress Notes (Signed)
 PHARMACY - ANTICOAGULATION CONSULT NOTE  Pharmacy Consult for IV heparin  Indication: DVT  Allergies  Allergen Reactions   Morphine  And Codeine Anaphylaxis   Shellfish Allergy Anaphylaxis   Betadine  [Povidone Iodine ] Itching   Bupropion Other (See Comments)    Shaking of the body, hallucinations    Chlorhexidine  Hives    Patient reports never had issues CHG with mouth rinse.   Influenza Vaccines Hives   Metoprolol  Rash    Patient Measurements: Height: 6' (182.9 cm) Weight: 109 kg (240 lb 4.8 oz) IBW/kg (Calculated) : 77.6 HEPARIN  DW (KG): 104.1  Vital Signs: Temp: 97.7 F (36.5 C) (07/21 0737) Temp Source: Oral (07/21 0737) BP: 139/95 (07/21 0737) Pulse Rate: 88 (07/21 0737)  Labs: Recent Labs    10/04/23 0328 10/04/23 0709 10/05/23 0533 10/05/23 1510 10/05/23 2243 10/06/23 0614  HGB 8.7*  --  8.9*  --   --  9.4*  HCT 26.9*  --  27.4*  --   --  29.2*  PLT 421*  --  432*  --   --  441*  HEPARINUNFRC  --    < > 0.27* 0.37 0.39 0.40  CREATININE 1.01  --  1.03  --   --  1.01   < > = values in this interval not displayed.    Estimated Creatinine Clearance: 100.5 mL/min (by C-G formula based on SCr of 1.01 mg/dL).   Assessment: 58 YO male with new DVT despite previous enoxaparin  50 mg daily. Patient received one dose of apixaban  10mg  on PM of 7/16; pharmacy consulted to dose heparin  given plans for surgery in near future.   7/20: Heparin  level today (10/05/23) is subtherapeutic at 0.27 while on previous infusion rate of heparin  2500 units/hr. Per charge nurse, no signs/symptoms of bleeding and there were no pauses of infusion overnight.   Heparin  level continues to be therapeutic. Hgb 9s, plt ok.  Goal of Therapy:  Heparin  level 0.3-0.7 units/ml Monitor platelets by anticoagulation protocol: Yes   Plan:  Continue infusion rate to 2700 units/hr   Daily heparin  level, CBC Monitor for s/sx of bleeding F/u to PO after surgery   Sergio Batch, PharmD, BCIDP,  AAHIVP, CPP Infectious Disease Pharmacist 10/06/2023 9:53 AM

## 2023-10-06 NOTE — Progress Notes (Signed)
 Occupational Therapy Treatment Patient Details Name: Dorin Stooksbury. MRN: [id number] DOB: 1965/05/09 Today's Date: 10/06/2023   History of present illness 58 y.o. male transferred from [facility] 07/03/23 to [facility] for management of newly diagnosed plasma cell myeloma with weakness and AMS. Pt also with AKI and metabolic encephalopathy. 5/10 transfer to [facility] for iHD. 4/22 Intubated and chemo initiated. 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 5/6 IVIG started. 5/8-6/11 trach. 5/12 iHD initiated. 5/29 PEG placed. 6/5 chemo initiated. PMHx:Lt THA, carpal tunnel syndrome, Lt TKA, Lt humerus fx, PAF, HTN, HLD, bipolar disorder, obesity, T2DM   OT comments  STAR Program OT/PT session: patient performed LB dressing with donning shorts at bed level with patient rolling side to side with max assist. Patient required max assist to get to EOB with use of bed pad to adjust hips and total assist to donn slippers. Patient instructed on slide board transfer to wheelchair with mod assist +2. Patient was provided education on use of BLE and RUE for wheelchair propulsion and was able to perform in hallway with frequent rest breaks. Patient assisted back to bed with max assist +2 with lateral scoot transfer with slide board. Discharger recommendations continue to be appropriate with Acute OT to continue to follow with STAR program.       If plan is discharge home, recommend the following:  Two people to help with walking and/or transfers;Assistance with cooking/housework;Assistance with feeding;Direct supervision/assist for medications management;Direct supervision/assist for financial management;Assist for transportation;Help with stairs or ramp for entrance;A lot of help with bathing/dressing/bathroom   Equipment Recommendations  Other (comment) (TBD)    Recommendations for Other Services      Precautions / Restrictions Precautions Precautions: Fall Recall of Precautions/Restrictions: Impaired Precaution/Restrictions  Comments: episode of syncope, sacral wound, chemo. Lt humerus non-union, splinted. no elbow ROM, but shoulder ROM ok per secure chat with Dr. Jonel 7/1 Required Braces or Orthoses: Sling Restrictions Weight Bearing Restrictions Per Provider Order: Yes LUE Weight Bearing Per Provider Order: Non weight bearing Other Position/Activity Restrictions: Lt humerus non-union, splinted. no elbow ROM, but shoulder ROM ok per secure chat with Dr. Jonel 7/1       Mobility Bed Mobility Overal bed mobility: Needs Assistance Bed Mobility: Rolling, Supine to Sit, Sit to Supine Rolling: Max assist, Used rails   Supine to sit: Max assist, HOB elevated, Used rails Sit to supine: +2 for physical assistance, Total assist   General bed mobility comments: patient assisting with getting to EOB with use of bed pad to assist. Requires total assist to return to supine    Transfers Overall transfer level: Needs assistance Equipment used: Sliding board Transfers: Bed to chair/wheelchair/BSC            Lateral/Scoot Transfers: +2 physical assistance, Max assist General transfer comment: performed slide board transfers from EOB to wheelchair with mod assist of 2 and max assisst of 2 to return to EOB     Balance Overall balance assessment: Needs assistance Sitting-balance support: Single extremity supported, Feet supported Sitting balance-Leahy Scale: Fair Sitting balance - Comments: EOB                                   ADL either performed or assessed with clinical judgement   ADL Overall ADL's : Needs assistance/impaired                 Upper Body Dressing : Moderate assistance;Bed level Upper  Body Dressing Details (indicate cue type and reason): to donn gown and sling Lower Body Dressing: Total assistance;Bed level Lower Body Dressing Details (indicate cue type and reason): donned shorts at bed level with patient rolling side to side to pull up and slip on shoes donned  from EOB               General ADL Comments: performed LB dressing to address slide board transfers    Extremity/Trunk Assessment              Vision       Perception     Praxis     Communication Communication Communication: Impaired Factors Affecting Communication: Reduced clarity of speech   Cognition Arousal: Alert Behavior During Therapy: WFL for tasks assessed/performed Cognition: Cognition impaired     Awareness: Intellectual awareness intact   Attention impairment (select first level of impairment): Sustained attention Executive functioning impairment (select all impairments): Problem solving                   Following commands: Impaired Following commands impaired: Follows one step commands with increased time      Cueing   Cueing Techniques: Verbal cues, Tactile cues, Visual cues  Exercises Exercises: Other exercises Other Exercises Other Exercises: Wheelchair mobility in hallway with patient using BLEs and RUE to propel chair    Shoulder Instructions       General Comments VSS on RA    Pertinent Vitals/ Pain       Pain Assessment Pain Assessment: Faces Faces Pain Scale: Hurts even more Pain Location: left shoulder and Right knee Pain Descriptors / Indicators: Discomfort, Grimacing, Sharp, Moaning Pain Intervention(s): Limited activity within patient's tolerance, Monitored during session, Repositioned  Home Living                                          Prior Functioning/Environment              Frequency  Min 1X/week (STAR program)        Progress Toward Goals  OT Goals(current goals can now be found in the care plan section)  Progress towards OT goals: Progressing toward goals  Acute Rehab OT Goals Patient Stated Goal: to go home OT Goal Formulation: With patient Time For Goal Achievement: 10/09/23 Potential to Achieve Goals: Fair ADL Goals Pt Will Perform Grooming: with  supervision;with set-up;sitting Pt Will Perform Upper Body Dressing: with contact guard assist;sitting Pt Will Transfer to Toilet: bedside commode Pt/caregiver will Perform Home Exercise Program: Right Upper extremity;Increased strength;With written HEP provided;Independently Additional ADL Goal #1: Pt will complete bed mobility with mod A as a precursor to EOB ADLs  Plan      Co-evaluation    PT/OT/SLP Co-Evaluation/Treatment: Yes Reason for Co-Treatment: For patient/therapist safety;To address functional/ADL transfers PT goals addressed during session: Mobility/safety with mobility;Balance OT goals addressed during session: ADL's and self-care      AM-PAC OT 6 Clicks Daily Activity     Outcome Measure   Help from another person eating meals?: A Little Help from another person taking care of personal grooming?: A Little Help from another person toileting, which includes using toliet, bedpan, or urinal?: Total Help from another person bathing (including washing, rinsing, drying)?: A Lot Help from another person to put on and taking off regular upper body clothing?: A Lot Help from another person to put  on and taking off regular lower body clothing?: Total 6 Click Score: 12    End of Session Equipment Utilized During Treatment: Gait belt;Other (comment) (slide board and wheelchair)  OT Visit Diagnosis: Unsteadiness on feet (R26.81);Other abnormalities of gait and mobility (R26.89);Muscle weakness (generalized) (M62.81);Pain Pain - Right/Left: Right Pain - part of body: Knee (and left shoulder)   Activity Tolerance Patient tolerated treatment well   Patient Left in bed;with call bell/phone within reach   Nurse Communication Mobility status        Time: 8596-8492 OT Time Calculation (min): 64 min  Charges: OT General Charges $OT Visit: 1 Visit OT Treatments $Self Care/Home Management : 23-37 mins  [name], OTA Acute Rehabilitation Services  Office  [phone]   [name] 10/06/2023, 3:34 PM

## 2023-10-07 ENCOUNTER — Inpatient Hospital Stay (HOSPITAL_COMMUNITY)

## 2023-10-07 DIAGNOSIS — R6521 Severe sepsis with septic shock: Secondary | ICD-10-CM | POA: Diagnosis not present

## 2023-10-07 DIAGNOSIS — A419 Sepsis, unspecified organism: Secondary | ICD-10-CM | POA: Diagnosis not present

## 2023-10-07 LAB — MAGNESIUM: Magnesium: 1.8 mg/dL (ref 1.7–2.4)

## 2023-10-07 LAB — COMPREHENSIVE METABOLIC PANEL WITH GFR
ALT: 5 U/L (ref 0–44)
AST: 9 U/L — ABNORMAL LOW (ref 15–41)
Albumin: 2.1 g/dL — ABNORMAL LOW (ref 3.5–5.0)
Alkaline Phosphatase: 83 U/L (ref 38–126)
Anion gap: 9 (ref 5–15)
BUN: 23 mg/dL — ABNORMAL HIGH (ref 6–20)
CO2: 22 mmol/L (ref 22–32)
Calcium: 8.6 mg/dL — ABNORMAL LOW (ref 8.9–10.3)
Chloride: 101 mmol/L (ref 98–111)
Creatinine, Ser: 1.24 mg/dL (ref 0.61–1.24)
GFR, Estimated: >60 mL/min (ref >60)
Glucose, Bld: 87 mg/dL (ref 70–99)
Potassium: 4.6 mmol/L (ref 3.5–5.1)
Sodium: 132 mmol/L — ABNORMAL LOW (ref 135–145)
Total Bilirubin: 0.5 mg/dL (ref 0.0–1.2)
Total Protein: 5.5 g/dL — ABNORMAL LOW (ref 6.5–8.1)

## 2023-10-07 LAB — CBC
HCT: 29.1 % — ABNORMAL LOW (ref 39.0–52.0)
Hemoglobin: 9.6 g/dL — ABNORMAL LOW (ref 13.0–17.0)
MCH: 35.4 pg — ABNORMAL HIGH (ref 26.0–34.0)
MCHC: 33 g/dL (ref 30.0–36.0)
MCV: 107.4 fL — ABNORMAL HIGH (ref 80.0–100.0)
Platelets: 464 K/uL — ABNORMAL HIGH (ref 150–400)
RBC: 2.71 MIL/uL — ABNORMAL LOW (ref 4.22–5.81)
RDW: 19.7 % — ABNORMAL HIGH (ref 11.5–15.5)
WBC: 5.1 K/uL (ref 4.0–10.5)
nRBC: 0 % (ref 0.0–0.2)

## 2023-10-07 LAB — GLUCOSE, CAPILLARY
Glucose-Capillary: 97 mg/dL (ref 70–99)
Glucose-Capillary: 155 mg/dL — ABNORMAL HIGH (ref 70–99)
Glucose-Capillary: 118 mg/dL — ABNORMAL HIGH (ref 70–99)
Glucose-Capillary: 166 mg/dL — ABNORMAL HIGH (ref 70–99)

## 2023-10-07 LAB — HEPARIN LEVEL (UNFRACTIONATED): Heparin Unfractionated: 0.4 [IU]/mL (ref 0.30–0.70)

## 2023-10-07 MED ORDER — PREDNISONE 10 MG PO TABS
50.0000 mg | ORAL_TABLET | Freq: Four times a day (QID) | ORAL | Status: AC
  Administered 2023-10-09 – 2023-10-10 (×3): 50 mg via ORAL
  Filled 2023-10-07 (×3): qty 5

## 2023-10-07 MED ORDER — DOXYCYCLINE HYCLATE 100 MG IV SOLR: 500.0000 mg | INTRAVENOUS | Status: DC

## 2023-10-07 MED ORDER — STERILE WATER FOR INJECTION IJ SOLN: 200.0000 mg | INTRAVENOUS | Status: DC

## 2023-10-07 MED ORDER — STERILE WATER FOR INJECTION IJ SOLN
500.0000 mg | INTRAVENOUS | Status: AC
  Administered 2023-10-10: 500 mg
  Filled 2023-10-07: qty 500

## 2023-10-07 MED ORDER — DIPHENHYDRAMINE HCL 25 MG PO CAPS
50.0000 mg | ORAL_CAPSULE | Freq: Once | ORAL | Status: AC
  Administered 2023-10-10: 50 mg via ORAL
  Filled 2023-10-07: qty 2

## 2023-10-07 MED ORDER — COLLAGENASE 250 UNIT/GM EX OINT
TOPICAL_OINTMENT | Freq: Every day | CUTANEOUS | Status: DC
  Administered 2023-11-07: 1 via TOPICAL
  Filled 2023-10-07: qty 30

## 2023-10-07 NOTE — TOC Progression Note (Signed)
 Transition of Care (TOC) - Progression Note    Patient Details  Name: Frank [name]. 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 CSW will submit new FL2 post-op with updated PT/OT recommendations.  Expected Discharge Plan: Skilled Nursing Facility Barriers to Discharge: Continued Medical Work up  Expected Discharge Plan and Services In-house Referral: Clinical Social Work Discharge Planning Services: CM Consult Post Acute Care Choice: Skilled Nursing Facility Living arrangements for the past 2 months: Single Family Home                 DME Arranged: N/A DME Agency: NA       HH Arranged: NA HH Agency: NA         Social Determinants of Health (SDOH) Interventions SDOH Screenings   Food Insecurity: No Food Insecurity (07/05/2023)  Housing: Low Risk  (07/05/2023)  Transportation Needs: No Transportation Needs (07/05/2023)  Utilities: Not At Risk (07/05/2023)  Tobacco Use: High Risk (08/30/2023)    Readmission Risk Interventions     No data to display

## 2023-10-07 NOTE — Plan of Care (Signed)
  Problem: Education: Goal: Knowledge of General Education information will improve Description: Including pain rating scale, medication(s)/side effects and non-pharmacologic comfort measures Outcome: Progressing   Problem: Health Behavior/Discharge Planning: Goal: Ability to manage health-related needs will improve Outcome: Progressing   Problem: Clinical Measurements: Goal: Ability to maintain clinical measurements within normal limits will improve Outcome: Progressing Goal: Will remain free from infection Outcome: Progressing Goal: Diagnostic test results will improve Outcome: Progressing Goal: Respiratory complications will improve Outcome: Progressing Goal: Cardiovascular complication will be avoided Outcome: Progressing   Problem: Activity: Goal: Risk for activity intolerance will decrease Outcome: Progressing   Problem: Nutrition: Goal: Adequate nutrition will be maintained Outcome: Progressing   Problem: Coping: Goal: Level of anxiety will decrease Outcome: Progressing   Problem: Elimination: Goal: Will not experience complications related to bowel motility Outcome: Progressing Goal: Will not experience complications related to urinary retention Outcome: Progressing   Problem: Pain Managment: Goal: General experience of comfort will improve and/or be controlled Outcome: Progressing   Problem: Safety: Goal: Ability to remain free from injury will improve Outcome: Progressing   Problem: Skin Integrity: Goal: Risk for impaired skin integrity will decrease Outcome: Progressing   Problem: Education: Goal: Ability to describe self-care measures that may prevent or decrease complications (Diabetes Survival Skills Education) will improve Outcome: Progressing Goal: Individualized Educational Video(s) Outcome: Progressing   Problem: Coping: Goal: Ability to adjust to condition or change in health will improve Outcome: Progressing   Problem: Fluid  Volume: Goal: Ability to maintain a balanced intake and output will improve Outcome: Progressing   Problem: Health Behavior/Discharge Planning: Goal: Ability to identify and utilize available resources and services will improve Outcome: Progressing Goal: Ability to manage health-related needs will improve Outcome: Progressing   Problem: Metabolic: Goal: Ability to maintain appropriate glucose levels will improve Outcome: Progressing   Problem: Nutritional: Goal: Maintenance of adequate nutrition will improve Outcome: Progressing Goal: Progress toward achieving an optimal weight will improve Outcome: Progressing   Problem: Skin Integrity: Goal: Risk for impaired skin integrity will decrease Outcome: Progressing   Problem: Tissue Perfusion: Goal: Adequacy of tissue perfusion will improve Outcome: Progressing   Problem: Education: Goal: Knowledge about tracheostomy care/management will improve Outcome: Progressing   Problem: Activity: Goal: Ability to tolerate increased activity will improve Outcome: Progressing   Problem: Health Behavior/Discharge Planning: Goal: Ability to manage tracheostomy will improve Outcome: Progressing   Problem: Respiratory: Goal: Patent airway maintenance will improve Outcome: Progressing   Problem: Role Relationship: Goal: Ability to communicate will improve Outcome: Progressing   Problem: Education: Goal: Knowledge of disease and its progression will improve Outcome: Progressing Goal: Individualized Educational Video(s) Outcome: Progressing   Problem: Fluid Volume: Goal: Compliance with measures to maintain balanced fluid volume will improve Outcome: Progressing   Problem: Health Behavior/Discharge Planning: Goal: Ability to manage health-related needs will improve Outcome: Progressing   Problem: Nutritional: Goal: Ability to make healthy dietary choices will improve Outcome: Progressing

## 2023-10-07 NOTE — Progress Notes (Signed)
 Progress Note   Patient: Frank [name]. FMW:[id number] DOB: 12-Feb-1966 DOA: 07/03/2023     96 DOS: the patient was seen and examined on 10/07/2023   Brief hospital course: 58 year old PMH OSA CPAP, pathological fracture and multiple ORIF left Humerus likely due to multiple myeloma, A-fib not on anticoagulation, hyperlipidemia, tobacco use disorder who was feeling weak for 1 week, admitted to [facility] with concern of sepsis, community-acquired pneumonia and acute encephalopathy. Patient was found to have lytic lesions, he was transferred to [facility] for oncology treatment. In the meantime he developed encephalopathy, acute renal failure. He remained in the ICU for 36 days. Underwent tracheostomy. He was also started on HD after CRRT,off of HD now. Subsequently PEG tube placed 5/29 and IR drain placement of right thigh hip infected hematoma on 7/11 . S/p PEG tube removal. Needs Port-A-Cath placement (for outpatient chemo), either during this hospitalization or as an outpatient. F/u IR recommendations for drain management. Started on heparin  drip on 7/17 because of b/l LE DVT. Plan for left arm amputation to be done by Dr Keturah either later this week or next week. Pending SNF placement.  Assessment and Plan: Closed fracture of left distal humerus History of Pathological fracture and multiple ORIF left Humerus 01/2023   Infected Right Thigh Hematoma:   Underwent IR drain placement for infected Hematoma on 7/11.  Continue Linezolid  (will start 7/17) and Augmentin  x 2 weeks as per ID (Dr Dennise). Vanc dced (high trough). Follow cultures-no growth.  CT right hip done on 7/17.  Sepsis in the setting of Staph Epi Bacteremia - Resolved [facility] course complicated by intermittent fevers, ID consulted and most recently evaluated patient 6/17.   -He had a fever and MRSE bacteremia 1/4 bottles, completed a course of vancomycin  with the end date 6/20.  He did receive a line holiday.  Repeat blood culture  6/9 without growth. - He has unstageable sacral ulcer, continue local wound care.   Right thigh swelling: US  soft tissue showed fluid collection over lateral right thigh, abscess vs seroma.   B/l LE DVT: US  venous doppler done on 7/16, positive for DVT in his RLE PTV, left CFV, SFJ, and PFV. Started on heparin  drip on the morning of 7/17. I discussed in length with the patient about the risks and benefits of anticoagulation including life-threatening bleeding and possibly death.  He made an informed decision to be started on anticoagulation.  I spoke to him that once he is off of heparin  drip and no more surgical procedures are planned, he will be transitioned to oral Eliquis  on discharge.  I also spoke to him that if he develops bleeding from a site then we will have to stop the anticoagulation and he may need an IVC filter at that time.   Multiple myeloma   Initiated chemotherapy on 08/21/2023. Initially given Velcade /Cytoxan . Now on Sarclisa /Kyprolis /Cytoxan .   -- Cycle 2 chemotherapy will be done as an out patient. --Needs Port-A-cath placement, likely closer to discharge or as an outpatient. - Myeloma responding well to treatment, IgG decreased from 11,507 to WNL 1470. -- Dr [name] on board.   Left sided pleuritic chest wall pain: Resolved now No rib fractures seen on CXR Prn analgesics   Aspergillosis with pneumonia -On Voriconazole  per ID.  End date for voriconazole  is 8/5, continue.   Acute Hypoxic Respiratory Failure -  trach removed 6/11.  Respiratory status stable. No acute issues.   Severe acute metabolic encephalopathy with hypoactive delirium, seizure disorder -  Normal MRI of the brain.  LP negative for meningitis or encephalitis.  EEG negative for seizures but encephalopathy.  -Resolved   Insulin -dependent diabetes type 2- A1c 6.0, continue with sliding scale insulin .     AKI on CKD stage IIIA, Uremia,  Hyponatremia, Hypokalemia -Due to MM, hypercalcemia and ACE inhibitors?   Nephrology consulted, was on hemodialysis but eventually he tolerated being off HD, tunneled catheter discontinued per nephrology.  Renal function is stable.   Paroxysmal A-fib - Unable to  anticoagulate due to hematoma.  Rate controlled   Obstructive Sleep Apnea -Trach removed. CPAP    Anxiety/Bipolar disorder - Continue with Paxil .     Severe protein calorie Malnutrition - Advanced to regular diet.  PEG tube was removed on 10/01/2023.   History of Pathological fracture and multiple ORIF left Humerus 01/2023: -Dr. Jonel discussed with orthopedics on 6/26, options would involve either a substantial reconstruction with antibiotic cement and prolonged, difficult recovery, or above elbow amputation.  [name] recommends more rehabilitation at this time prior to attempting surgery. - Maintain left arm splint when out of bed - [name] following, plan for left arm amputation either later this week or next week.      Subjective: No acute events overnight. Resting comfortably in bed. Physical Exam: Vitals:   10/07/23 0409 10/07/23 0641 10/07/23 0730 10/07/23 0758  BP: (!) 141/98 (!) 141/98 (!) 146/107   Pulse: 92  98   Resp: 16  18   Temp: 98.4 F (36.9 C)  98.1 F (36.7 C)   TempSrc: Oral     SpO2: 97%  93% 93%  Weight:      Height:       Constitutional: NAD, calm, comfortable Eyes: PERRL, lids and conjunctivae normal ENMT: Mucous membranes are moist. Posterior pharynx clear of any exudate or lesions.Normal dentition.  Neck: normal, supple, no masses, no thyromegaly, scar from trach removal site Respiratory: clear to auscultation bilaterally, no wheezing, no crackles. Normal respiratory effort. No accessory muscle use.  Cardiovascular: Regular rate and rhythm, no murmurs / rubs / gallops. B/l LE extremity pitting edema, L>R. 2+ pedal pulses. No carotid bruits.  Abdomen: no tenderness, no masses palpated. No hepatosplenomegaly. Bowel sounds positive. Musculoskeletal: Unable to  move left arm. Left arm is edematous. Swollen right lateral thigh with no erythema or discharge. Right sided hip catheter in place. Skin: no rashes, lesions, ulcers. No induration. Right hip drain in place Neurologic: Unable to move left arm, no other acute neuro deficits elsewhere Psychiatric: Normal judgment and insight. Alert and oriented x 3. Normal mood.     Data Reviewed:  There are no new results to review at this time.  Family Communication: None at the bedside  Disposition: Status is: Inpatient Remains inpatient appropriate because: Physical deconditioning, sepsis  Planned Discharge Destination: Skilled nursing facility    Time spent: 41 minutes  Author: Deliliah Room, MD 10/07/2023 12:34 PM  For on call review [email or web address].

## 2023-10-07 NOTE — Consult Note (Signed)
 WOC Nurse wound follow up Wound type: Stage 3 sacrum R/L buttocks; healed  Measurement: Sacrum: 5 cm x 3 cm x 0.2 cm Wound bed: 75% clean, red, moist, 25% yellow slough Drainage (amount, consistency, odor) serosanguinous Periwound: macerated  Dressing procedure/placement/frequency:  Sacrum: Cleanse wound with NS allow to air dry, apply Santyl  to wound bed, cover with saline moistened gauze, cover with foam change daily and PRN soiling.   LALM in place for moisture management and pressure redistribution.   WOC Nurse team will follow with you and see patient within 10 days for wound assessments.  Please notify WOC nurses of any acute changes in the wounds or any new areas of concern   Doyal Polite, RN, MSN, [facility] WOC Team

## 2023-10-07 NOTE — Progress Notes (Signed)
 PHARMACY - ANTICOAGULATION CONSULT NOTE  Pharmacy Consult for IV heparin  Indication: DVT  Allergies  Allergen Reactions   Morphine  And Codeine Anaphylaxis   Shellfish Allergy Anaphylaxis   Betadine  [Povidone Iodine ] Itching   Bupropion Other (See Comments)    Shaking of the body, hallucinations    Chlorhexidine  Hives    Patient reports never had issues CHG with mouth rinse.   Influenza Vaccines Hives   Metoprolol  Rash    Patient Measurements: Height: 6' (182.9 cm) Weight: 109 kg (240 lb 4.8 oz) IBW/kg (Calculated) : 77.6 HEPARIN  DW (KG): 104.1  Vital Signs: Temp: 98.1 F (36.7 C) (07/22 0730) Temp Source: Oral (07/22 0409) BP: 146/107 (07/22 0730) Pulse Rate: 98 (07/22 0730)  Labs: Recent Labs    10/05/23 0533 10/05/23 1510 10/05/23 2243 10/06/23 0614 10/07/23 0638  HGB 8.9*  --   --  9.4* 9.6*  HCT 27.4*  --   --  29.2* 29.1*  PLT 432*  --   --  441* 464*  HEPARINUNFRC 0.27*   < > 0.39 0.40 0.40  CREATININE 1.03  --   --  1.01 1.24   < > = values in this interval not displayed.    Estimated Creatinine Clearance: 81.8 mL/min (by C-G formula based on SCr of 1.24 mg/dL).   Assessment: 58 YO male with new DVT despite previous enoxaparin  50 mg daily. Patient received one dose of apixaban  10mg  on PM of 7/16; pharmacy consulted to dose heparin  given plans for surgery in near future.   7/22 AM: heparin  level therapeutic on 2700 units/hr, no infusion issues or overt s/sx of bleeding to report  Goal of Therapy:  Heparin  level 0.3-0.7 units/ml Monitor platelets by anticoagulation protocol: Yes   Plan:  Continue infusion rate to 2700 units/hr   Daily heparin  level, CBC Monitor for s/sx of bleeding F/u to PO after surgery   Koren Or, PharmD Clinical Pharmacist 10/07/2023 11:01 AM Please check AMION for all [facility] Pharmacy numbers

## 2023-10-07 NOTE — Progress Notes (Signed)
 Physical Therapy Wound Treatment Patient Details  Name: Frank [name]. MRN: [id number] Date of Birth: January 18, 1966  Today's Date: 10/07/2023 Time: 8961-8885 Time Calculation (min): 36 min  Subjective  Subjective Assessment Patient and Family Stated Goals: to be able to see his grandson Date of Onset:  (exact date unknown) Prior Treatments: Sacral foam  Pain Score:  5/10 pain medication administered prior to treatment  Wound Assessment  Pressure Injury 07/11/23 Sacrum Mid Unstageable - Full thickness tissue loss in which the base of the injury is covered by slough (yellow, tan, gray, green or brown) and/or eschar (tan, brown or black) in the wound bed. (Active)  Wound Image   09/30/23 1600  New photo taken 7/22 but did not upload from iPad  Dressing Type Gauze (Comment);Santyl ;Barrier Film (skin prep);Foam - Lift dressing to assess site every shift 10/07/23 1400  Dressing Changed 10/07/23 1400  Dressing Change Frequency Daily 10/07/23 1400  State of Healing Early/partial granulation 10/07/23 1400  Site / Wound Assessment Clean;Pink;Pale;Granulation tissue 10/07/23 1400  % Wound base Red or Granulating 75% 10/07/23 1400  % Wound base Yellow/Fibrinous Exudate 25% 10/07/23 1400  % Wound base Black/Eschar 0% 10/03/23 1559  % Wound base Other/Granulation Tissue (Comment) 0% 10/03/23 1559  Peri-wound Assessment Erythema (blanchable);Maceration;Pink 10/07/23 1400  Wound Length (cm) 4.8 cm 10/07/23 1400  Wound Width (cm) 3.7 cm 10/07/23 1400  Wound Depth (cm) 0.5 cm 10/07/23 1400  Wound Surface Area (cm^2) 17.76 cm^2 10/07/23 1400  Wound Volume (cm^3) 8.88 cm^3 10/07/23 1400  Margins Unattached edges (unapproximated) 10/07/23 1400  Drainage Amount Minimal 10/07/23 1400  Drainage Description Serous 10/07/23 1400  Treatment Cleansed;Debridement (Selective);Irrigation 10/07/23 1400        Wound Assessment and Plan  Wound Therapy - Assess/Plan/Recommendations Wound Therapy - Clinical  Statement: Wound still has some minor stringy slough in the wound bed was removed as well as non viable macerated tissue along the margins, Wound has med 2/3 goals. At this time, Santyl  will likely be adequate to deal with remainder of necrotic material in wound bed.  Pt no longer needs PT wound treatment services. PT is signing off. Wound Therapy - Functional Problem List: decreased mobility, cancer and chemo Factors Delaying/Impairing Wound Healing: Diabetes Mellitus, Multiple medical problems, Immobility, Polypharmacy Hydrotherapy Plan: Debridement, Dressing change, Patient/family education Wound Therapy - Frequency: 2X / week Wound Therapy - Current Recommendations: OT, PT, Case manager/social work Wound Therapy - Follow Up Recommendations: dressing changes by RN  Wound Therapy Goals- Improve the function of patient's integumentary system by progressing the wound(s) through the phases of wound healing (inflammation - proliferation - remodeling) by: Wound Therapy Goals - Improve the function of patient's integumentary system by progressing the wound(s) through the phases of wound healing by: Decrease Necrotic Tissue to: 50 Decrease Necrotic Tissue - Progress: Met Increase Granulation Tissue to: 30 Increase Granulation Tissue - Progress: Met Decrease Length/Width/Depth by (cm): L and W by 0.5 cm Decrease Length/Width/Depth - Progress: Progressing toward goal Goals/treatment plan/discharge plan were made with and agreed upon by patient/family: Yes Time For Goal Achievement: 7 days Wound Therapy - Potential for Goals: Fair  Goals will be updated until maximal potential achieved or discharge criteria met.  Discharge criteria: when goals achieved, discharge from hospital, MD decision/surgical intervention, no progress towards goals, refusal/missing three consecutive treatments without notification or medical reason.  GP     Charges PT Wound Care Charges $Wound Debridement up to 20 cm: < or  equal to 20 cm $PT Hydrotherapy  Dressing: 1 dressing     Jaelynne Hockley B. Fleeta Lapidus PT, DPT Acute Rehabilitation Services Please use secure chat or  Call Office [phone]   Frank [name] 10/07/2023, 2:53 PM

## 2023-10-07 NOTE — Procedures (Signed)
 Pre procedural Dx: right hip fluid collection Post procedural Dx: Same  Technically successful U/S guided placement of a 10 Fr drainage catheter into the right hip fluid collection likely representing infected hematoma.    A representative aspirated sample was capped and sent to the laboratory for analysis.    EBL: Trace Complications: None immediate  KANDICE Banner, MD Pager #: [phone]

## 2023-10-07 NOTE — Progress Notes (Signed)
 Physical Therapy Treatment Patient Details Name: Frank [name]. MRN: [id number] DOB: 02-23-1966 Today's Date: 10/07/2023   History of Present Illness 58 y.o. male transferred from [facility] 07/03/23 to [facility] for management of newly diagnosed plasma cell myeloma with weakness and AMS. Pt also with AKI and metabolic encephalopathy. 5/10 transfer to [facility] for iHD. 4/22 Intubated and chemo initiated. 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 5/6 IVIG started. 5/8-6/11 trach. 5/12 iHD initiated. 5/29 PEG placed. 6/5 chemo initiated. PMHx:Lt THA, carpal tunnel syndrome, Lt TKA, Lt humerus fx, PAF, HTN, HLD, bipolar disorder, obesity, T2DM    PT Comments  STAR PT/OT Session: Pt requiring less assistance for coming from supine to sit today with use of bed rails and is able to sequence scooting his hips more effectively. Attempted to come to standing in [place], but is limited in ability by increased R knee pain. Attempted to work on Estate manager/land agent with forward lean to facility offweighting of hips to assist in power up with pulling on Stedy with R UE. Despite increased cuing and demonstration pt has difficulty performing. STAR will continue to work on transfers. Pt with increased fatigue at end of session requiring total Ax2 to return to bed.     If plan is discharge home, recommend the following: Two people to help with walking and/or transfers;Assistance with cooking/housework;Assist for transportation;Two people to help with bathing/dressing/bathroom;Direct supervision/assist for medications management;Direct supervision/assist for financial management;Help with stairs or ramp for entrance;Assistance with feeding   Can travel by private vehicle     No  Equipment Recommendations  Hospital bed;Hoyer lift;Wheelchair (measurements PT);Wheelchair cushion (measurements PT);BSC/3in1    Recommendations for Other Services       Precautions / Restrictions Precautions Precautions: Fall Recall of  Precautions/Restrictions: Impaired Precaution/Restrictions Comments: plan for amputation, Dr Kendal okay with arm in sling Required Braces or Orthoses: Sling Restrictions Weight Bearing Restrictions Per Provider Order: Yes LUE Weight Bearing Per Provider Order: Non weight bearing Other Position/Activity Restrictions: Lt humerus non-union, splinted. no elbow ROM, but shoulder ROM ok per secure chat with Dr. Jonel 7/1     Mobility  Bed Mobility Overal bed mobility: Needs Assistance Bed Mobility: Rolling, Supine to Sit, Sit to Supine Rolling: Max assist, Used rails   Supine to sit: Min assist, HOB elevated, Used rails Sit to supine: +2 for physical assistance, Total assist   General bed mobility comments: patient was able to get to EOB with min assist and increased time. assistance with trunk and BLEs to return to supine    Transfers Overall transfer level: Needs assistance Equipment used: Ambulation equipment used Transfers: Sit to/from Stand Sit to Stand: Max assist, +2 physical assistance, +2 safety/equipment           General transfer comment: standing performed from EOB with use of bed pads and max assist +2 with limited standing tolerance due to RLE knee pain Transfer via Lift Equipment: Stedy        Balance Overall balance assessment: Needs assistance Sitting-balance support: Single extremity supported, Feet supported Sitting balance-Leahy Scale: Fair Sitting balance - Comments: EOB   Standing balance support: Single extremity supported, During functional activity, Reliant on assistive device for balance Standing balance-Leahy Scale: Zero Standing balance comment: patient reliant on LUE and therapist for support when standing                            Communication Communication Communication: Impaired Factors Affecting Communication: Reduced clarity of  speech  Cognition Arousal: Alert Behavior During Therapy: WFL for tasks assessed/performed                              Following commands: Impaired Following commands impaired: Follows one step commands with increased time    Cueing Cueing Techniques: Verbal cues, Tactile cues, Visual cues  Exercises      General Comments General comments (skin integrity, edema, etc.): VSS on RA      Pertinent Vitals/Pain Pain Assessment Pain Assessment: Faces Faces Pain Scale: Hurts even more Pain Location: left shoulder and Right knee when WBing Pain Descriptors / Indicators: Discomfort, Grimacing, Sharp, Moaning Pain Intervention(s): Limited activity within patient's tolerance, Repositioned, Monitored during session     PT Goals (current goals can now be found in the care plan section) Acute Rehab PT Goals PT Goal Formulation: With patient Time For Goal Achievement: 10/09/23 Potential to Achieve Goals: Fair Progress towards PT goals: Progressing toward goals    Frequency    Min 5X/week      PT Plan      Co-evaluation PT/OT/SLP Co-Evaluation/Treatment: Yes Reason for Co-Treatment: For patient/therapist safety;To address functional/ADL transfers PT goals addressed during session: Mobility/safety with mobility;Balance OT goals addressed during session: ADL's and self-care      AM-PAC PT 6 Clicks Mobility   Outcome Measure  Help needed turning from your back to your side while in a flat bed without using bedrails?: A Lot Help needed moving from lying on your back to sitting on the side of a flat bed without using bedrails?: Total Help needed moving to and from a bed to a chair (including a wheelchair)?: Total Help needed standing up from a chair using your arms (e.g., wheelchair or bedside chair)?: Total Help needed to walk in hospital room?: Total Help needed climbing 3-5 steps with a railing? : Total 6 Click Score: 7    End of Session Equipment Utilized During Treatment: Gait belt Activity Tolerance: Patient tolerated treatment well Patient left:  in bed;with call bell/phone within reach Nurse Communication: Mobility status PT Visit Diagnosis: Other abnormalities of gait and mobility (R26.89);Muscle weakness (generalized) (M62.81);Difficulty in walking, not elsewhere classified (R26.2);Unsteadiness on feet (R26.81)     Time: 8660-8570 PT Time Calculation (min) (ACUTE ONLY): 50 min  Charges:    $Therapeutic Activity: 8-22 mins PT General Charges $$ ACUTE PT VISIT: 1 Visit                     Teigan Manner B. Fleeta Lapidus PT, DPT Acute Rehabilitation Services Please use secure chat or  Call Office [phone]    [name] [facility] 10/07/2023, 2:59 PM

## 2023-10-07 NOTE — Progress Notes (Signed)
 Occupational Therapy Treatment Patient Details Name: Frank [name]. MRN: [id number] DOB: 1965/09/01 Today's Date: 10/07/2023   History of present illness 58 y.o. male transferred from [facility] 07/03/23 to [facility] for management of newly diagnosed plasma cell myeloma with weakness and AMS. Pt also with AKI and metabolic encephalopathy. 5/10 transfer to [facility] for iHD. 4/22 Intubated and chemo initiated. 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 5/6 IVIG started. 5/8-6/11 trach. 5/12 iHD initiated. 5/29 PEG placed. 6/5 chemo initiated. PMHx:Lt THA, carpal tunnel syndrome, Lt TKA, Lt humerus fx, PAF, HTN, HLD, bipolar disorder, obesity, T2DM   OT comments  [facility] OT/PT session: Patient demonstrating good gains this treatment session with patient able to go supine to sitting on EOB with min assist from max assist with use of bed rail and increased time. Lateral scooting along bed attempted with patient requiring mod assist due to air mattress. Patient educated on body mechanics for sit to stands and required max assist +2 to stand from EOB into [place]. Patient continues to require max assist +2 for sit to supine. Discharge recommendations continue to be appropriate with Acute OT to continue to follow with [facility].       If plan is discharge home, recommend the following:  Two people to help with walking and/or transfers;Assistance with cooking/housework;Assistance with feeding;Direct supervision/assist for medications management;Direct supervision/assist for financial management;Assist for transportation;Help with stairs or ramp for entrance;A lot of help with bathing/dressing/bathroom   Equipment Recommendations  Other (comment) (TBD)    Recommendations for Other Services      Precautions / Restrictions Precautions Precautions: Fall Recall of Precautions/Restrictions: Impaired Precaution/Restrictions Comments: plan for amputation, Dr Kendal okay with arm in sling Required Braces or Orthoses:  Sling Restrictions Weight Bearing Restrictions Per Provider Order: Yes LUE Weight Bearing Per Provider Order: Non weight bearing Other Position/Activity Restrictions: Lt humerus non-union, splinted. no elbow ROM, but shoulder ROM ok per secure chat with Dr. Jonel 7/1       Mobility Bed Mobility Overal bed mobility: Needs Assistance Bed Mobility: Rolling, Supine to Sit, Sit to Supine Rolling: Max assist, Used rails   Supine to sit: Min assist, HOB elevated, Used rails Sit to supine: +2 for physical assistance, Total assist   General bed mobility comments: patient was able to get to EOB with min assist and increased time. assistance with trunk and BLEs to return to supine    Transfers Overall transfer level: Needs assistance Equipment used: Ambulation equipment used Transfers: Sit to/from Stand Sit to Stand: Max assist, +2 physical assistance, +2 safety/equipment           General transfer comment: standing performed from EOB with use of bed pads and max assist +2 with limited standing tolerance due to RLE knee pain Transfer via Lift Equipment: Stedy   Balance Overall balance assessment: Needs assistance Sitting-balance support: Single extremity supported, Feet supported Sitting balance-Leahy Scale: Fair Sitting balance - Comments: EOB   Standing balance support: Single extremity supported, During functional activity, Reliant on assistive device for balance Standing balance-Leahy Scale: Zero Standing balance comment: patient reliant on LUE and therapist for support when standing                           ADL either performed or assessed with clinical judgement   ADL Overall ADL's : Needs assistance/impaired                 Upper Body Dressing : Moderate  assistance;Bed level Upper Body Dressing Details (indicate cue type and reason): to donn gown and sling                   General ADL Comments: focused on bed mobility and standing from EOB     Extremity/Trunk Assessment              Vision       Perception     Praxis     Communication Communication Communication: Impaired Factors Affecting Communication: Reduced clarity of speech   Cognition Arousal: Alert Behavior During Therapy: WFL for tasks assessed/performed Cognition: Cognition impaired           Executive functioning impairment (select all impairments): Problem solving                   Following commands: Impaired Following commands impaired: Follows one step commands with increased time      Cueing   Cueing Techniques: Verbal cues, Tactile cues, Visual cues  Exercises      Shoulder Instructions       General Comments VSS on RA    Pertinent Vitals/ Pain       Pain Assessment Pain Assessment: Faces Faces Pain Scale: Hurts even more Pain Location: left shoulder and Right knee when WBing Pain Descriptors / Indicators: Discomfort, Grimacing, Sharp, Moaning Pain Intervention(s): Limited activity within patient's tolerance, Monitored during session, Repositioned  Home Living                                          Prior Functioning/Environment              Frequency  Min 1X/week Environmental education officer Program)        Progress Toward Goals  OT Goals(current goals can now be found in the care plan section)  Progress towards OT goals: Progressing toward goals  Acute Rehab OT Goals Patient Stated Goal: to go home OT Goal Formulation: With patient Time For Goal Achievement: 10/09/23 Potential to Achieve Goals: Fair ADL Goals Pt Will Perform Grooming: with supervision;with set-up;sitting Pt Will Perform Upper Body Dressing: with contact guard assist;sitting Pt Will Transfer to Toilet: bedside commode Pt/caregiver will Perform Home Exercise Program: Right Upper extremity;Increased strength;With written HEP provided;Independently Additional ADL Goal #1: Pt will complete bed mobility with mod A as a precursor to EOB  ADLs  Plan      Co-evaluation    PT/OT/SLP Co-Evaluation/Treatment: Yes Reason for Co-Treatment: For patient/therapist safety;To address functional/ADL transfers PT goals addressed during session: Mobility/safety with mobility;Balance OT goals addressed during session: ADL's and self-care      AM-PAC OT 6 Clicks Daily Activity     Outcome Measure   Help from another person eating meals?: A Little Help from another person taking care of personal grooming?: A Little Help from another person toileting, which includes using toliet, bedpan, or urinal?: Total Help from another person bathing (including washing, rinsing, drying)?: A Lot Help from another person to put on and taking off regular upper body clothing?: A Lot Help from another person to put on and taking off regular lower body clothing?: Total 6 Click Score: 12    End of Session Equipment Utilized During Treatment: Gait belt;Other (comment) Laurent)  OT Visit Diagnosis: Unsteadiness on feet (R26.81);Other abnormalities of gait and mobility (R26.89);Muscle weakness (generalized) (M62.81);Pain Pain - Right/Left: Right Pain - part of body: Knee (  left shoulder)   Activity Tolerance Patient tolerated treatment well   Patient Left in bed;with call bell/phone within reach   Nurse Communication Mobility status        Time: 8660-8570 OT Time Calculation (min): 50 min  Charges: OT General Charges $OT Visit: 1 Visit OT Treatments $Self Care/Home Management : 8-22 mins $Therapeutic Activity: 8-22 mins  [name], OTA Acute Rehabilitation Services  Office [phone]   [name] 10/07/2023, 2:41 PM

## 2023-10-07 NOTE — Progress Notes (Signed)
 Mr. Lizer is doing all right.  He really has no complaints this morning.  He still has a drainage tube in the right hip.  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  We really need to find out if and when he is going to have his left arm amputated.  This will really dictate when I gave him his next dose of chemotherapy.  He has had a good appetite.  He has had no nausea or vomiting.  He has had no problems with bowels or bladder.  He has had no bleeding.  He has had no fever.  He has had no cough or shortness of breath.  His labs yesterday show sodium 134.  Potassium 5.3.  BUN 22 creatinine 1.01.  Calcium  8.6 with an albumin  of 2.2.  His white cell count is 5.2.  Hemoglobin 9.4.  Platelet count 441,000.  I know that Physical therapy and Occupational Therapy are working on him quite a bit.  I appreciate their help.  I ordered a 24-hour urine to be done on him over a week ago.  For some reason, I am not surprised that it was never done.  I will have to order this again.  This is incredibly important to help me assess his response to treatment.   Jeralyn Crease, MD  [place] 1:9

## 2023-10-07 NOTE — Progress Notes (Signed)
 Patient ID: Frank Jama Desiderio Mickey., male   DOB: Jul 06, 1965, 58 y.o.   MRN: [id number]    Referring Physician(s): Montano,Emil  Supervising Physician: Vanice Revel  Patient Status:  [facility] - In-pt  Chief Complaint:  Right hip fluid collection s/p percutaneous drain placement with Dr. Jenna 09/26/23    Subjective:  Pt sitting up in bed, no new complaints. No pain at drain site.   Allergies: Morphine  and codeine, Shellfish allergy, Betadine  [povidone iodine ], Bupropion, Chlorhexidine , Influenza vaccines, and Metoprolol   Medications: Prior to Admission medications   Medication Sig Start Date End Date Taking? Authorizing Provider  albuterol  (PROVENTIL  HFA;VENTOLIN  HFA) 108 (90 Base) MCG/ACT inhaler Inhale 2 puffs into the lungs every 6 (six) hours as needed for wheezing or shortness of breath.   Yes [provider]  albuterol  (PROVENTIL ) (2.5 MG/3ML) 0.083% nebulizer solution 2.5 mg every 2 (two) hours as needed for wheezing or shortness of breath.   Yes [provider]  aspirin  EC 81 MG tablet Take 81 mg by mouth daily. Swallow whole.   Yes [provider]  busPIRone  (BUSPAR ) 15 MG tablet Take 15 mg by mouth at bedtime.   Yes [provider]  celecoxib  (CELEBREX ) 200 MG capsule Take 200 mg by mouth daily.   Yes [provider]  clonazePAM  (KLONOPIN ) 1 MG tablet Take 1 mg by mouth once as needed for anxiety.   Yes [provider]  cyclobenzaprine  (FLEXERIL ) 5 MG tablet Take 1 tablet (5 mg total) by mouth 3 (three) times daily as needed for muscle spasms. Patient taking differently: Take 5 mg by mouth daily as needed for muscle spasms. 01/30/23  Yes Danton Lauraine LABOR, PA-C  divalproex  (DEPAKOTE ) 500 MG DR tablet Take 500 mg by mouth 2 (two) times daily.   Yes [provider]  EPINEPHrine  0.3 mg/0.3 mL IJ SOAJ injection Inject 0.3 mg into the muscle as needed for anaphylaxis. 08/28/21  Yes [provider]  esomeprazole  (NEXIUM) 40 MG capsule Take 40 mg by mouth in the morning.   Yes [provider]  Fluticasone -Salmeterol (ADVAIR) 500-50 MCG/DOSE AEPB Inhale 2 puffs into the lungs in the morning.   Yes [provider]  furosemide  (LASIX ) 40 MG tablet Take 40 mg by mouth daily as needed for fluid or edema.   Yes [provider]  gabapentin  (NEURONTIN ) 300 MG capsule Take 300-600 mg by mouth See admin instructions. 300 mg in the morning, 600 mg at bedtime   Yes [provider]  hydrALAZINE  (APRESOLINE ) 10 MG tablet Take 10 mg by mouth in the morning and at bedtime.   Yes [provider]  hydrochlorothiazide  (HYDRODIURIL ) 25 MG tablet Take 25 mg by mouth daily.   Yes [provider]  lisinopril  (ZESTRIL ) 40 MG tablet Take 40 mg by mouth daily. 03/25/23  Yes [provider]  metFORMIN  (GLUCOPHAGE ) 1000 MG tablet Take 1,000 mg by mouth 2 (two) times daily with a meal.   Yes [provider]  montelukast  (SINGULAIR ) 10 MG tablet Take 10 mg by mouth at bedtime.   Yes [provider]  OVER THE COUNTER MEDICATION Take 400 mg by mouth at bedtime. Burdock root   Yes [provider]  oxyCODONE  (ROXICODONE ) 5 MG immediate release tablet Take 1 tablet (5 mg total) by mouth every 4 (four) hours as needed for severe pain (pain score 7-10). 01/30/23  Yes McClung, Lauraine LABOR, PA-C  PARoxetine  (PAXIL ) 40 MG tablet Take 40 mg by mouth every morning.  Yes [provider]  spironolactone  (ALDACTONE ) 25 MG tablet Take 25 mg by mouth at bedtime. 10/07/21  Yes [provider]  tamsulosin  (FLOMAX ) 0.4 MG CAPS capsule Take 0.4 mg by mouth at bedtime. 10/07/21  Yes [provider]  testosterone  cypionate (DEPOTESTOSTERONE CYPIONATE) 200 MG/ML injection Inject 0.5 mLs (100 mg total) into the muscle every 14 (fourteen) days. 09/17/16  Yes Nida, Gebreselassie W, MD  verapamil  (CALAN -SR) 120 MG CR tablet Take 120 mg by mouth daily.   Yes  [provider]  Vitamin D , Ergocalciferol , (DRISDOL ) 1.25 MG (50000 UNIT) CAPS capsule Take 1 capsule (50,000 Units total) by mouth every Thursday. Patient taking differently: Take 50,000 Units by mouth once a week. Tuesday 02/06/23  Yes Danton Lauraine LABOR, PA-C  buPROPion (WELLBUTRIN XL) 150 MG 24 hr tablet Take 150 mg by mouth daily. Patient not taking: Reported on 07/04/2023    [provider]     Vital Signs: BP (!) 146/107   Pulse 98   Temp 98.1 F (36.7 C)   Resp 18   Ht 6' (1.829 m)   Wt 240 lb 4.8 oz (109 kg)   SpO2 93%   BMI 32.59 kg/m   Physical Exam Vitals and nursing note reviewed.  Constitutional:      General: He is not in acute distress.    Appearance: He is not toxic-appearing.  Cardiovascular:     Rate and Rhythm: Normal rate and regular rhythm.  Pulmonary:     Effort: Pulmonary effort is normal.     Breath sounds: Normal breath sounds.  Abdominal:     Palpations: Abdomen is soft.     Tenderness: There is no abdominal tenderness.     Comments: + R lateral lower abd R hip drain.  No overlying abnormality or tenderness to palpation.  Dressed appropriately.  Skin:    General: Skin is warm and dry.  Neurological:     Mental Status: He is alert and oriented to person, place, and time. Mental status is at baseline.     Imaging: DG CHEST PORT 1 VIEW Result Date: 10/06/2023 EXAM: 1 VIEW XRAY OF THE CHEST 10/06/2023 08:52:00 PM COMPARISON: 09/28/2023 CLINICAL HISTORY: [id number] SOB (shortness of breath) 141880. sob FINDINGS: LUNGS AND PLEURA: Increased interstitial markings, left greater than right, favoring atypical infection / pneumonia over interstitial edema. No pleural effusion. No pneumothorax. HEART AND MEDIASTINUM: No acute abnormality of the cardiac and mediastinal silhouettes. BONES AND SOFT TISSUES: Degenerative changes of the bilateral shoulders. No acute osseous abnormality. IMPRESSION: 1. Increased interstitial markings, left greater than  right, favoring atypical infection/pneumonia over interstitial edema. Electronically signed by: Pinkie Pebbles MD 10/06/2023 08:58 PM EDT RP Workstation: [id number]    Labs:  CBC: Recent Labs    10/04/23 0328 10/05/23 0533 10/06/23 9385 10/07/23 0638  WBC 4.8 4.4 5.2 5.1  HGB 8.7* 8.9* 9.4* 9.6*  HCT 26.9* 27.4* 29.2* 29.1*  PLT 421* 432* 441* 464*    COAGS: Recent Labs    07/05/23 2227 07/07/23 2030 07/10/23 0411 07/26/23 0423 08/13/23 0908 10/02/23 1456 10/02/23 2305 10/03/23 0719  INR 2.1* 1.9*  --   --  1.0  --   --   --   APTT  --  44*   < > 36  --  43* 39* 52*   < > = values in this interval not displayed.    BMP: Recent Labs    10/04/23 0328 10/05/23 0533 10/06/23 0614 10/07/23 0638  NA 133* 133* 134*  132*  K 4.3 4.7 5.3* 4.6  CL 102 99 100 101  CO2 24 24 24 22   GLUCOSE 93 85 94 87  BUN 30* 27* 22* 23*  CALCIUM  8.5* 8.4* 8.6* 8.6*  CREATININE 1.01 1.03 1.01 1.24  GFRNONAA >60 >60 >60 >60    LIVER FUNCTION TESTS: Recent Labs    10/04/23 0328 10/05/23 0533 10/06/23 0614 10/07/23 0638  BILITOT 0.6 0.2 0.4 0.5  AST 9* 8* 10* 9*  ALT 6 6 7 5   ALKPHOS 78 84 89 83  PROT 5.2* 5.3* 5.5* 5.5*  ALBUMIN  2.0* 2.0* 2.2* 2.1*    Assessment and Plan:  Day 11 post right hip drain placement - pt doing well, no pain at drain site - drain with consistent output, ~40cc per day - Concern for [facility] lesion - plan for drain eval with possible sclerosing Friday 7/25 with Dr Jenna. He requested new pelvic CT scan as prior imaging has not been high enough to eval the drain. This was completed today and pending. Drain eval/sclerosing has been scheduled for 9am 7/25. Pt made aware - Will continue to follow drain   Drain Location: Right hip Size: Fr size: 10 Fr Date of placement: 09/26/23  Currently to: Drain collection device: suction bulb 24 hour output:  Output by Drain (mL) 10/05/23 0701 - 10/05/23 1900 10/05/23 1901 - 10/06/23 0700 10/06/23  0701 - 10/06/23 1900 10/06/23 1901 - 10/07/23 0700 10/07/23 0701 - 10/07/23 1625  Closed System Drain Right Buttock Bulb (JP) 10 Fr. 40    65    Current examination: Flushes/aspirates easily.  Insertion site unremarkable. Suture and stat lock in place. Dressed appropriately.   Plan: Continue TID flushes with 5 cc NS. Record output Q shift. Dressing changes QD or PRN if soiled.  Call IR APP or on call IR MD if difficulty flushing or sudden change in drain output.  Repeat imaging/possible drain injection once output < 10 mL/QD (excluding flush material). Consideration for drain removal if output is < 10 mL/QD (excluding flush material), pending discussion with the providing surgical service.  Discharge planning: Please contact IR APP or on call IR MD prior to patient d/c to ensure appropriate follow up plans are in place. Typically patient will follow up with IR clinic 10-14 days post d/c for repeat imaging/possible drain injection. IR scheduler will contact patient with date/time of appointment. Patient will need to flush drain QD with 5 cc NS, record output QD, dressing changes every 2-3 days or earlier if soiled.   IR will continue to follow - please call with questions or concerns.     Electronically Signed: Kimble VEAR Clas, PA-C 10/07/2023, 4:18 PM   I spent a total of 15 Minutes at the the patient's bedside AND on the patient's hospital floor or unit, greater than 50% of which was counseling/coordinating care for right hip drain follow up

## 2023-10-08 ENCOUNTER — Other Ambulatory Visit: Payer: Self-pay

## 2023-10-08 DIAGNOSIS — J181 Lobar pneumonia, unspecified organism: Secondary | ICD-10-CM | POA: Diagnosis not present

## 2023-10-08 DIAGNOSIS — B449 Aspergillosis, unspecified: Secondary | ICD-10-CM | POA: Diagnosis not present

## 2023-10-08 DIAGNOSIS — A419 Sepsis, unspecified organism: Secondary | ICD-10-CM | POA: Diagnosis not present

## 2023-10-08 DIAGNOSIS — C9000 Multiple myeloma not having achieved remission: Secondary | ICD-10-CM | POA: Diagnosis not present

## 2023-10-08 DIAGNOSIS — R6521 Severe sepsis with septic shock: Secondary | ICD-10-CM | POA: Diagnosis not present

## 2023-10-08 DIAGNOSIS — E66811 Obesity, class 1: Secondary | ICD-10-CM

## 2023-10-08 LAB — CBC
HCT: 28.9 % — ABNORMAL LOW (ref 39.0–52.0)
Hemoglobin: 9.5 g/dL — ABNORMAL LOW (ref 13.0–17.0)
MCH: 35.1 pg — ABNORMAL HIGH (ref 26.0–34.0)
MCHC: 32.9 g/dL (ref 30.0–36.0)
MCV: 106.6 fL — ABNORMAL HIGH (ref 80.0–100.0)
Platelets: 481 K/uL — ABNORMAL HIGH (ref 150–400)
RBC: 2.71 MIL/uL — ABNORMAL LOW (ref 4.22–5.81)
RDW: 19.5 % — ABNORMAL HIGH (ref 11.5–15.5)
WBC: 6 K/uL (ref 4.0–10.5)
nRBC: 0 % (ref 0.0–0.2)

## 2023-10-08 LAB — HEPARIN LEVEL (UNFRACTIONATED): Heparin Unfractionated: 0.43 [IU]/mL (ref 0.30–0.70)

## 2023-10-08 LAB — GLUCOSE, CAPILLARY
Glucose-Capillary: 135 mg/dL — ABNORMAL HIGH (ref 70–99)
Glucose-Capillary: 146 mg/dL — ABNORMAL HIGH (ref 70–99)
Glucose-Capillary: 131 mg/dL — ABNORMAL HIGH (ref 70–99)
Glucose-Capillary: 137 mg/dL — ABNORMAL HIGH (ref 70–99)

## 2023-10-08 NOTE — Progress Notes (Signed)
 Ortho Note  Patient seen and examined today.  Still having significant pain in his left upper extremity.  Has minimal motor and sensory function to the upper extremity as well.  I have discussed at length risks and benefits of proceeding with through humerus amputation.  He wishes to proceed.  Unfortunately this week is very busy with the OR volume so I will plan for amputation on Monday, July 28.  Please make him n.p.o. after midnight Sunday night for surgery on Monday.  Franky MYRTIS Light, MD [facility] [phone] (office) [email or web address]

## 2023-10-08 NOTE — Progress Notes (Signed)
 PHARMACY - ANTICOAGULATION CONSULT NOTE  Pharmacy Consult for IV heparin  Indication: DVT  Allergies  Allergen Reactions   Morphine  And Codeine Anaphylaxis   Shellfish Allergy Anaphylaxis   Betadine  [Povidone Iodine ] Itching   Bupropion Other (See Comments)    Shaking of the body, hallucinations    Chlorhexidine  Hives    Patient reports never had issues CHG with mouth rinse.   Influenza Vaccines Hives   Metoprolol  Rash    Patient Measurements: Height: 6' (182.9 cm) Weight: 109 kg (240 lb 4.8 oz) IBW/kg (Calculated) : 77.6 HEPARIN  DW (KG): 104.1  Vital Signs: Temp: 97.8 F (36.6 C) (07/23 0728) Temp Source: Oral (07/23 0728) BP: 152/94 (07/23 0728) Pulse Rate: 95 (07/23 0728)  Labs: Recent Labs    10/06/23 0614 10/07/23 9361 10/08/23 0530  HGB 9.4* 9.6* 9.5*  HCT 29.2* 29.1* 28.9*  PLT 441* 464* 481*  HEPARINUNFRC 0.40 0.40 0.43  CREATININE 1.01 1.24  --     Estimated Creatinine Clearance: 81.8 mL/min (by C-G formula based on SCr of 1.24 mg/dL).   Assessment: 58 YO male with new DVT despite previous enoxaparin  50 mg daily. Patient received one dose of apixaban  10mg  on PM of 7/16; pharmacy consulted to dose heparin  given plans for surgery in near future.   7/23 AM: heparin  level therapeutic on 2700 units/hr, no infusion issues or overt s/sx of bleeding to report. CBC stable  Goal of Therapy:  Heparin  level 0.3-0.7 units/ml Monitor platelets by anticoagulation protocol: Yes   Plan:  Continue infusion rate to 2700 units/hr   Daily heparin  level, CBC Monitor for s/sx of bleeding F/u to PO after surgery   Koren Or, PharmD Clinical Pharmacist 10/08/2023 11:55 AM Please check AMION for all [facility] Pharmacy numbers

## 2023-10-08 NOTE — Plan of Care (Signed)
  Problem: Education: Goal: Knowledge of General Education information will improve Description: Including pain rating scale, medication(s)/side effects and non-pharmacologic comfort measures Outcome: Progressing   Problem: Health Behavior/Discharge Planning: Goal: Ability to manage health-related needs will improve Outcome: Progressing   Problem: Clinical Measurements: Goal: Ability to maintain clinical measurements within normal limits will improve Outcome: Progressing Goal: Will remain free from infection Outcome: Progressing Goal: Diagnostic test results will improve Outcome: Progressing Goal: Respiratory complications will improve Outcome: Progressing Goal: Cardiovascular complication will be avoided Outcome: Progressing   Problem: Activity: Goal: Risk for activity intolerance will decrease Outcome: Progressing   Problem: Nutrition: Goal: Adequate nutrition will be maintained Outcome: Progressing   Problem: Coping: Goal: Level of anxiety will decrease Outcome: Progressing   Problem: Elimination: Goal: Will not experience complications related to bowel motility Outcome: Progressing Goal: Will not experience complications related to urinary retention Outcome: Progressing   Problem: Pain Managment: Goal: General experience of comfort will improve and/or be controlled Outcome: Progressing   Problem: Safety: Goal: Ability to remain free from injury will improve Outcome: Progressing   Problem: Skin Integrity: Goal: Risk for impaired skin integrity will decrease Outcome: Progressing   Problem: Education: Goal: Ability to describe self-care measures that may prevent or decrease complications (Diabetes Survival Skills Education) will improve Outcome: Progressing Goal: Individualized Educational Video(s) Outcome: Progressing   Problem: Coping: Goal: Ability to adjust to condition or change in health will improve Outcome: Progressing   Problem: Fluid  Volume: Goal: Ability to maintain a balanced intake and output will improve Outcome: Progressing   Problem: Health Behavior/Discharge Planning: Goal: Ability to identify and utilize available resources and services will improve Outcome: Progressing Goal: Ability to manage health-related needs will improve Outcome: Progressing   Problem: Metabolic: Goal: Ability to maintain appropriate glucose levels will improve Outcome: Progressing   Problem: Nutritional: Goal: Maintenance of adequate nutrition will improve Outcome: Progressing Goal: Progress toward achieving an optimal weight will improve Outcome: Progressing   Problem: Skin Integrity: Goal: Risk for impaired skin integrity will decrease Outcome: Progressing   Problem: Tissue Perfusion: Goal: Adequacy of tissue perfusion will improve Outcome: Progressing   Problem: Education: Goal: Knowledge about tracheostomy care/management will improve Outcome: Progressing   Problem: Activity: Goal: Ability to tolerate increased activity will improve Outcome: Progressing   Problem: Health Behavior/Discharge Planning: Goal: Ability to manage tracheostomy will improve Outcome: Progressing   Problem: Respiratory: Goal: Patent airway maintenance will improve Outcome: Progressing   Problem: Role Relationship: Goal: Ability to communicate will improve Outcome: Progressing   Problem: Education: Goal: Knowledge of disease and its progression will improve Outcome: Progressing Goal: Individualized Educational Video(s) Outcome: Progressing   Problem: Fluid Volume: Goal: Compliance with measures to maintain balanced fluid volume will improve Outcome: Progressing   Problem: Health Behavior/Discharge Planning: Goal: Ability to manage health-related needs will improve Outcome: Progressing   Problem: Nutritional: Goal: Ability to make healthy dietary choices will improve Outcome: Progressing

## 2023-10-08 NOTE — Progress Notes (Signed)
 Nutrition Follow-up  DOCUMENTATION CODES:   Severe malnutrition in context of acute illness/injury  INTERVENTION:   Encourage PO intake-currently on regular diet Continue Ensure Plus High Protein po BID, each supplement provides 350 kcal and 20 grams of protein  Continue 30 ml Prosource Plus TID, each supplement provides 100 kcals and 15 grams protein Continue 1 packet Juven PO BID, each packet provides 95 calories, 2.5 grams of protein (collagen), to support wound healing  Continue MVI with minerals daily  NUTRITION DIAGNOSIS:   Severe Malnutrition related to acute illness (prolonged illness, interuptions in feeding) as evidenced by moderate fat depletion, moderate muscle depletion, percent weight loss (9.6% x 1 month). - Still applicable   GOAL:   Patient will meet greater than or equal to 90% of their needs - Meeting via PO intake   MONITOR:   PO intake, Supplement acceptance, Diet advancement  REASON FOR ASSESSMENT:   Consult Assessment of nutrition requirement/status  ASSESSMENT:   58 y.o. male with PMH of obesity, asthma, OSA on CPAP, GERD, HTN, HLD who presented due to feeling weak with AMS for a few days. Admitted with concern for sepsis, community-acquired pneumonia, encephalopathy as well as concern for multiple myeloma.  4/17 Admit to [facility] 4/21 SLP eval - NPO; IR attempted Cortrak but unable to be placed; CCM MD successfully placed NGT 4/22 Concern for aspiration overnight; Intubated 4/23 - CRRT 5/8 - tracheostomy 5/10 - CRRT discontinued 5/11 - transferred to [facility] 5/12 - iHD initiated  5/29 - PEG placed 5/31 - chemotherapy changed to Sarclisa /Kyprolis /Cytoxan  6/9 - MBS, NPO recommended  6/11 - trach removed 6/13 - BSE, NPO, tunneled HD catheter removed for line holiday 6/16 - Tunneled HD replaced,  6/18 - FEES, thin liquids when supervised 6/19, 6/21 - BSE, clear liquids 6/23 - MBS, clear liquids, pt not able to full participate in study  due to pain; no HD since 6/16, line pulled pt showing signs of renal recovery 6/24 - FEES, thin liquids and puree ok 6/27 - BSE - DYS 1, thins 7/10- per ortho notes, pt with rt hip/ thigh hematoma 7/11- s/p BSE- dysphagia 2 diet with thin liquids 7/14- s/p BSE- dysphagia 3 diet with thin liquids 7/16- advanced to regular diet per MD (oncology), PEG tube removed   Pt doing well this afternoon, eating lunch on visit. Pt had PEG removed and diet advanced on 10/01/2023. Pt reports he has been doing very well with his intake and eating 75-100% of his meals. He reports having a very good appetite. No N/V noted. Pt has been taking x3 Prosource per day, x 2 Juven per day, and  x 2 Ensures per day.   Pt was just baptized earlier in the week and reports he feels like a weight has been lifted off his shoulder. Pt planned to have L arm amputation this upcoming Monday 10/13/2023. RD encouraged increased protein intake after amputation and including a protein with every meal.    Performed repeat NFPE, exam limited due to edema on lower and upper extremities. Chemotherapy on hold until amputation.   Admit weight: 110 kg Current weight: 109 kg  +1 RUE, LUE edema + 3 RLE + LLE edema  Average Meal Intake: 7/14: 50-75% x 2 recorded meal intakes  7/15: 75% x 1 recorded meal intakes  7/19: 100% x 1 recorded meal intakes  7/22: 50% x 1 recorded meal intakes   Nutritionally Relevant Medications: Scheduled Meds:  (feeding supplement) PROSource Plus  30 mL Oral TID  BM   epoetin  alfa  40,000 Units Subcutaneous Q Wed-1800   feeding supplement  237 mL Oral BID WC   ferrous sulfate   325 mg Oral Q breakfast   guaiFENesin   600 mg Oral BID   hydrALAZINE   25 mg Oral Q8H   insulin  aspart  0-15 Units Subcutaneous TID AC & HS   insulin  aspart  2 Units Subcutaneous TID AC & HS   insulin  glargine-yfgn  10 Units Subcutaneous Q24H   multivitamin with minerals  1 tablet Oral Daily   nutrition supplement (JUVEN)  1  packet Oral BID AC   senna  1 tablet Oral Daily   Continuous Infusions:  sodium chloride  Stopped (08/28/23 1208)   sodium chloride  Stopped (08/28/23 0839)   heparin  2,700 Units/hr (10/08/23 0336)    Labs Reviewed: No recent BMP or CMP  CBG ranges from 97-166 mg/dL over the last 24 hours HgbA1c 6.0  NUTRITION - FOCUSED PHYSICAL EXAM:  Flowsheet Row Most Recent Value  Orbital Region Moderate depletion  Upper Arm Region No depletion  Thoracic and Lumbar Region Mild depletion  Buccal Region Mild depletion  Temple Region Moderate depletion  Clavicle Bone Region No depletion  Clavicle and Acromion Bone Region Mild depletion  Scapular Bone Region Mild depletion  Dorsal Hand Mild depletion  Patellar Region Unable to assess  [Fluid]  Anterior Thigh Region Unable to assess  Posterior Calf Region Unable to assess  Edema (RD Assessment) Moderate  Hair Reviewed  Eyes Reviewed  Mouth Reviewed  Skin Reviewed  Nails Reviewed    Diet Order:   Diet Order             Diet NPO time specified Except for: Sips with Meds  Diet effective midnight           Diet regular Room service appropriate? Yes; Fluid consistency: Thin  Diet effective now                   EDUCATION NEEDS:   Not appropriate for education at this time  Skin:  Skin Assessment: Skin Integrity Issues: Skin Integrity Issues:: Stage III, Stage II DTI: Left Buttocks Stage II: lt ischium Stage III: sacrum, rt ischium Unstageable: Left and Right Heel  Last BM:  10/06/2023 type 6  Height:   Ht Readings from Last 1 Encounters:  10/01/23 6' (1.829 m)    Weight:   Wt Readings from Last 1 Encounters:  10/06/23 109 kg    Ideal Body Weight:  80.9 kg  BMI:  Body mass index is 32.59 kg/m.  Estimated Nutritional Needs:   Kcal:  2400-2700 kcal/d  Protein:  130-150g/d  Fluid:  2.5L/d   Olivia Kenning, RD Registered Dietitian  See Amion for more information

## 2023-10-08 NOTE — Progress Notes (Signed)
 [name] really looks good this morning.  The big news is that he was baptized earlier this week.  I am so happy for him.  He really is happy that he could get baptized.  He just feels more confident now.  He knows where the strength is coming from.  Again, I really hope that he is going to have the left arm amputation this week.  He continues on heparin  in anticipation of the amputation.  He still has a drain in the right thigh.  There is still fluid coming out.  Again the fluid is negative for any bacteria.  His appetite is doing great.  He has had no nausea or vomiting.  He has had no cough.  He has had no problems with bowels or bladder.  24-hour urine is being collected.  His labs yesterday looked relatively stable.  Again, I am trying to hold off on his treatment until we know about the amputation.  His legs do not look nearly as swollen.  I am happy about that.  Again the heparin  I think is helping quite a bit.  His vital signs show temperature 97.6.  Pulse 82.  Blood pressure 137/102.  His lungs sound clear bilaterally.  Cardiac exam regular rate and rhythm.  Abdomen is soft.  Bowel sounds are present.  He has no fluid wave.  There is no guarding or rebound tenderness.  Extremity shows a drainage tube in the right thigh.  There is clear brown fluid in the collection bulb.  There is no swelling that I can see in his legs.  I cannot palpate any venous cord.  Neurological exam shows no focal neurological deficits.  Again, we really need to pin down when the left arm amputation will be.  This will help me plan for further chemotherapy.  Again he is in great shape for surgery.  I am so happy about the baptism.  He feels so much more stronger now.  He has a lot of confidence.  He knows who is in charge.    Jeralyn Crease, MD  Hebrews 12:12

## 2023-10-08 NOTE — H&P (View-Only) (Signed)
 Ortho Note  Patient seen and examined today.  Still having significant pain in his left upper extremity.  Has minimal motor and sensory function to the upper extremity as well.  I have discussed at length risks and benefits of proceeding with through humerus amputation.  He wishes to proceed.  Unfortunately this week is very busy with the OR volume so I will plan for amputation on Monday, July 28.  Please make him n.p.o. after midnight Sunday night for surgery on Monday.  Frank MYRTIS Light, MD [facility] [phone] (office) [email or web address]

## 2023-10-08 NOTE — Progress Notes (Signed)
 Occupational Therapy Treatment Patient Details Name: Frank [name]. MRN: [id number] DOB: 23-Oct-1965 Today's Date: 10/08/2023   History of present illness 58 y.o. male transferred from [facility] 07/03/23 to [facility] for management of newly diagnosed plasma cell myeloma with weakness and AMS. Pt also with AKI and metabolic encephalopathy. 5/10 transfer to [facility] for iHD. 4/22 Intubated and chemo initiated. 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 5/6 IVIG started. 5/8-6/11 trach. 5/12 iHD initiated. 5/29 PEG placed. 6/5 chemo initiated. PMHx:Lt THA, carpal tunnel syndrome, Lt TKA, Lt humerus fx, PAF, HTN, HLD, bipolar disorder, obesity, T2DM   OT comments  STAR Program OT/PT session: Patient performed rolling in bed to address cleaning following bowel movement and to donn shorts with patient using RUE to assist with pulling up. Patient required mod assist for supine to sit due to patient stating he felt fatigued today. Setup provided for slide board transfer and min/mod assist +2 to perform with verbal cues. Patient performed 3 stands, pulling up from end of bed, with mod assist +2 and performed weight shifting on 3rd stand. Stedy used to assist patient back to bed with mod assist +2 to stand. Patient continues to make good gains in STAR program and will continue to be followed to address established goals.       If plan is discharge home, recommend the following:  Two people to help with walking and/or transfers;Assistance with cooking/housework;Assistance with feeding;Direct supervision/assist for medications management;Direct supervision/assist for financial management;Assist for transportation;Help with stairs or ramp for entrance;A lot of help with bathing/dressing/bathroom   Equipment Recommendations  Other (comment) (TBS)    Recommendations for Other Services      Precautions / Restrictions Precautions Precautions: Fall Recall of Precautions/Restrictions: Impaired Precaution/Restrictions Comments:  plan for amputation, Dr Kendal okay with arm in sling Required Braces or Orthoses: Sling Restrictions Weight Bearing Restrictions Per Provider Order: Yes LUE Weight Bearing Per Provider Order: Non weight bearing Other Position/Activity Restrictions: Lt humerus non-union, splinted. no elbow ROM, but shoulder ROM ok per secure chat with Dr. Jonel 7/1       Mobility Bed Mobility Overal bed mobility: Needs Assistance Bed Mobility: Rolling, Supine to Sit, Sit to Supine Rolling: Max assist, Used rails   Supine to sit: Mod assist, HOB elevated, Used rails Sit to supine: +2 for physical assistance, Total assist   General bed mobility comments: mod assist to get to EOB due to patient's complaints of fatigue    Transfers Overall transfer level: Needs assistance Equipment used: Sliding board, Ambulation equipment used Transfers: Sit to/from Stand, Bed to chair/wheelchair/BSC Sit to Stand: Mod assist, +2 physical assistance          Lateral/Scoot Transfers: Mod assist, Min assist, +2 physical assistance, With slide board General transfer comment: transfer from EOB to wheelchair with sliding board and min to mod assist +2 and mod assist +2 to stand into stedy from wheelchair for transfer back to EOB Transfer via Lift Equipment: Stedy   Balance Overall balance assessment: Needs assistance Sitting-balance support: Single extremity supported, Feet supported Sitting balance-Leahy Scale: Fair Sitting balance - Comments: EOB   Standing balance support: Single extremity supported, During functional activity, Reliant on assistive device for balance Standing balance-Leahy Scale: Poor Standing balance comment: patient reliant on Stedy for support, able to perform 3 sit to stands into [place] and performed weight shifting on 3rd stand  ADL either performed or assessed with clinical judgement   ADL Overall ADL's : Needs assistance/impaired              Lower Body Bathing: Total assistance;Bed level Lower Body Bathing Details (indicate cue type and reason): assisted with cleaning following BM   Upper Body Dressing Details (indicate cue type and reason): to donn gown and sling Lower Body Dressing: Bed level;Maximal assistance Lower Body Dressing Details (indicate cue type and reason): donned shorts at bed level with patient rolling side to side to pull up and slip on shoes donned from EOB                    Extremity/Trunk Assessment              Vision       Perception     Praxis     Communication Communication Communication: Impaired Factors Affecting Communication: Reduced clarity of speech   Cognition Arousal: Alert Behavior During Therapy: WFL for tasks assessed/performed Cognition: Cognition impaired     Awareness: Intellectual awareness intact   Attention impairment (select first level of impairment): Sustained attention Executive functioning impairment (select all impairments): Problem solving                   Following commands: Impaired Following commands impaired: Follows one step commands with increased time      Cueing   Cueing Techniques: Verbal cues, Tactile cues, Visual cues  Exercises      Shoulder Instructions       General Comments VSS on RA    Pertinent Vitals/ Pain       Pain Assessment Pain Assessment: Faces Faces Pain Scale: Hurts little more Pain Location: left shoulder and Right knee when WBing Pain Descriptors / Indicators: Discomfort, Grimacing, Sharp, Moaning Pain Intervention(s): Limited activity within patient's tolerance, Monitored during session, Repositioned  Home Living                                          Prior Functioning/Environment              Frequency  Min 1X/week Environmental education officer Program)        Progress Toward Goals  OT Goals(current goals can now be found in the care plan section)  Progress towards OT goals:  Progressing toward goals  Acute Rehab OT Goals Patient Stated Goal: to [name] OT Goal Formulation: With patient Time For Goal Achievement: 10/09/23 Potential to Achieve Goals: Fair ADL Goals Pt Will Perform Grooming: with supervision;with set-up;sitting Pt Will Perform Upper Body Dressing: with contact guard assist;sitting Pt Will Transfer to Toilet: bedside commode Pt/caregiver will Perform Home Exercise Program: Right Upper extremity;Increased strength;With written HEP provided;Independently Additional ADL Goal #1: Pt will complete bed mobility with mod A as a precursor to EOB ADLs  Plan      Co-evaluation    PT/OT/SLP Co-Evaluation/Treatment: Yes Reason for Co-Treatment: For patient/therapist safety;To address functional/ADL transfers PT goals addressed during session: Mobility/safety with mobility;Balance OT goals addressed during session: ADL's and self-care      AM-PAC OT 6 Clicks Daily Activity     Outcome Measure   Help from another person eating meals?: A Little Help from another person taking care of personal grooming?: A Little Help from another person toileting, which includes using toliet, bedpan, or urinal?: Total Help from another person bathing (including washing,  rinsing, drying)?: A Lot Help from another person to put on and taking off regular upper body clothing?: A Lot Help from another person to put on and taking off regular lower body clothing?: A Lot 6 Click Score: 13    End of Session Equipment Utilized During Treatment: Gait belt;Other (comment) (sliding board, w/c, Stedy)  OT Visit Diagnosis: Unsteadiness on feet (R26.81);Other abnormalities of gait and mobility (R26.89);Muscle weakness (generalized) (M62.81);Pain Pain - Right/Left: Right Pain - part of body: Knee (left shoulder)   Activity Tolerance Patient tolerated treatment well   Patient Left in bed;with call bell/phone within reach   Nurse Communication Mobility status         Time: 8584-8493 OT Time Calculation (min): 51 min  Charges: OT General Charges $OT Visit: 1 Visit OT Treatments $Self Care/Home Management : 8-22 mins $Therapeutic Activity: 8-22 mins  Dick [name], OTA Acute Rehabilitation Services  Office [phone]   Frank [name] 10/08/2023, 3:19 PM

## 2023-10-08 NOTE — Progress Notes (Signed)
 This chaplain is present for F/U spiritual care. The Pt. is awake and preparing to eat lunch.   The chaplain understands through reflective listening, the Pt. recent baptism and growing faith has opened doors for his own spiritual growth and positive communication with his family.   The Pt. is hopeful and waiting for more communication with the medical team about the upcoming amputation. The MD entered as the chaplain was leaving.   The Pt. remains open to F/U spiritual care.  Chaplain Leeroy Hummer [phone]

## 2023-10-08 NOTE — Progress Notes (Signed)
 Physical Therapy Treatment Patient Details Name: Frank [name]. MRN: [id number] DOB: 1965/06/13 Today's Date: 10/08/2023   History of Present Illness 58 y.o. male transferred from [facility] 07/03/23 to [facility] for management of newly diagnosed plasma cell myeloma with weakness and AMS. Pt also with AKI and metabolic encephalopathy. 5/10 transfer to [facility] for iHD. 4/22 Intubated and chemo initiated. 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 5/6 IVIG started. 5/8-6/11 trach. 5/12 iHD initiated. 5/29 PEG placed. 6/5 chemo initiated. PMHx:Lt THA, carpal tunnel syndrome, Lt TKA, Lt humerus fx, PAF, HTN, HLD, bipolar disorder, obesity, T2DM    PT Comments  [facility] PT/OT treatment session: Focus of session was transfer to wheelchair and standing trials at EOB. Pt able to tolerate 5 total stands, and demonstrated the ability to weight shift R and L for ~15 seconds. Pt reports feeling tired this date and states he is getting winded easily, however SpO2 maintained >98% on RA throughout session. Will continue to follow and progress as able per POC.     If plan is discharge home, recommend the following: Two people to help with walking and/or transfers;Assistance with cooking/housework;Assist for transportation;Two people to help with bathing/dressing/bathroom;Direct supervision/assist for medications management;Direct supervision/assist for financial management;Help with stairs or ramp for entrance;Assistance with feeding   Can travel by private vehicle     No  Equipment Recommendations  Hospital bed;Hoyer lift;Wheelchair (measurements PT);Wheelchair cushion (measurements PT);BSC/3in1    Recommendations for Other Services       Precautions / Restrictions Precautions Precautions: Fall Recall of Precautions/Restrictions: Impaired Precaution/Restrictions Comments: plan for amputation, Dr Kendal okay with arm in sling Required Braces or Orthoses: Sling Restrictions Weight Bearing Restrictions Per Provider  Order: Yes LUE Weight Bearing Per Provider Order: Non weight bearing Other Position/Activity Restrictions: Lt humerus non-union, splinted. no elbow ROM, but shoulder ROM ok per secure chat with Dr. Jonel 7/1     Mobility  Bed Mobility Overal bed mobility: Needs Assistance Bed Mobility: Rolling, Supine to Sit, Sit to Supine Rolling: Max assist, Used rails Sidelying to sit: Max assist, +2 for physical assistance, +2 for safety/equipment Supine to sit: Min assist, HOB elevated, Used rails Sit to supine: +2 for physical assistance, Total assist Sit to sidelying: Max assist, +2 for physical assistance General bed mobility comments: patient was able to get to EOB with min assist and increased time. assistance with trunk and BLEs to return to supine    Transfers Overall transfer level: Needs assistance Equipment used: Ambulation equipment used Transfers: Sit to/from Stand, Bed to chair/wheelchair/BSC Sit to Stand: +2 physical assistance, +2 safety/equipment, Mod assist          Lateral/Scoot Transfers: +2 physical assistance, Min assist General transfer comment: Slide board transfer bed>wheelchair with slide board, followed by 3 sit>stand transfers to bed rail for support. At end of session Stedy utilized for back to bed. Transfer via Lift Equipment: Stedy  Ambulation/Gait             Pre-gait activities: In standing, pt able to hold up to 15 seconds at a time, and was able to demonstrate weight shifting x3 to each side on last stand. General Gait Details: Deferred at this time   Psychologist, counselling propulsion: Both lower extermities Wheelchair Assistance Details (indicate cue type and reason): with Crocs on his feet pt is able to use bilateral feet and R UE to propel his wheelchair 30 feet with  multiple rest breaks   Tilt Bed    Modified Rankin (Stroke Patients Only)       Balance Overall balance assessment:  Needs assistance Sitting-balance support: Single extremity supported, Feet supported Sitting balance-Leahy Scale: Fair Sitting balance - Comments: EOB Postural control: Right lateral lean Standing balance support: Single extremity supported, During functional activity, Reliant on assistive device for balance Standing balance-Leahy Scale: Zero Standing balance comment: patient reliant on LUE and therapist for support when standing                            Communication Communication Communication: Impaired Factors Affecting Communication: Reduced clarity of speech  Cognition Arousal: Alert Behavior During Therapy: WFL for tasks assessed/performed   PT - Cognitive impairments: Sequencing, Problem solving, Safety/Judgement Difficult to assess due to: Impaired communication                     PT - Cognition Comments: cues for sequencing and step by step instructions for mobility Following commands: Impaired Following commands impaired: Follows one step commands with increased time    Cueing Cueing Techniques: Verbal cues, Tactile cues, Visual cues  Exercises General Exercises - Upper Extremity Shoulder Flexion: AROM, Right, 10 reps, Supine Shoulder Horizontal ABduction: Strengthening, Right, 10 reps, Supine, Theraband Theraband Level (Shoulder Horizontal Abduction): Level 1 (Yellow) Elbow Flexion: Strengthening, Right, 10 reps, Supine, Theraband Theraband Level (Elbow Flexion): Level 1 (Yellow) Elbow Extension: Strengthening, Right, 10 reps, Supine, Theraband Theraband Level (Elbow Extension): Level 1 (Yellow) Wrist Flexion: AROM, Right, 5 reps, Supine Wrist Extension: AROM, Right, 5 reps, Supine Digit Composite Flexion: AROM, Right, 5 reps, Supine Composite Extension: AROM, Right, 5 reps, Supine General Exercises - Lower Extremity Ankle Circles/Pumps: AROM, Both, 5 reps, Supine Quad Sets: AROM, Both, 5 reps, Supine Short Arc Quad: PROM, Both, 10 reps,  Supine Long Arc Quad: AROM, Both, 5 reps, Seated Heel Slides: Seated, Left, 5 reps Hip Flexion/Marching: AROM, Seated, Left, 5 reps Shoulder Exercises Elbow Flexion: AAROM, Right, 5 reps, Supine Elbow Extension: AAROM, Right, 5 reps, Supine Wrist Flexion: AAROM, Right, 5 reps, Supine Wrist Extension: AAROM, Right, 5 reps, Supine Low Level/ICU Exercises Ankle Circles/Pumps: PROM, Both, 10 reps, Supine Hip ABduction/ADduction: PROM, Both, 5 reps, Supine Heel Slides: PROM, Both, 5 reps, Supine Other Exercises Other Exercises: Wheelchair mobility in hallway with patient using BLEs and RUE to propel chair Other Exercises: x5 R lateral leans, MinA Other Exercises: Dynamic reaching with RUE    General Comments General comments (skin integrity, edema, etc.): VSS on RA      Pertinent Vitals/Pain Pain Assessment Pain Assessment: Faces Faces Pain Scale: Hurts even more Breathing: normal Negative Vocalization: none Facial Expression: smiling or inexpressive Body Language: relaxed Consolability: no need to console PAINAD Score: 0 Pain Location: left shoulder and Right knee when WBing Pain Descriptors / Indicators: Discomfort, Grimacing, Sharp, Moaning Pain Intervention(s): Limited activity within patient's tolerance, Monitored during session, Repositioned    Home Living Family/patient expects to be discharged to:: Private residence Living Arrangements: Spouse/significant other Available Help at Discharge: Family;Available PRN/intermittently Type of Home: Mobile home Home Access: Ramped entrance       Home Layout: One level Home Equipment: Agricultural consultant (2 wheels);Cane - single point;Wheelchair - Engineer, technical sales - power;Electric scooter;Shower seat;Grab bars - toilet;Grab bars - tub/shower;Hand held shower head;BSC/3in1 Additional Comments: Information from prior encounter    Prior Function            PT Goals (  current goals can now be found in the care plan section) Acute  Rehab PT Goals Patient Stated Goal: to get out of bed PT Goal Formulation: With patient Time For Goal Achievement: 10/09/23 Potential to Achieve Goals: Fair Progress towards PT goals: Progressing toward goals    Frequency    Min 5X/week      PT Plan      Co-evaluation PT/OT/SLP Co-Evaluation/Treatment: Yes Reason for Co-Treatment: For patient/therapist safety;To address functional/ADL transfers PT goals addressed during session: Mobility/safety with mobility;Balance OT goals addressed during session: ADL's and self-care      AM-PAC PT 6 Clicks Mobility   Outcome Measure  Help needed turning from your back to your side while in a flat bed without using bedrails?: A Lot Help needed moving from lying on your back to sitting on the side of a flat bed without using bedrails?: Total Help needed moving to and from a bed to a chair (including a wheelchair)?: Total Help needed standing up from a chair using your arms (e.g., wheelchair or bedside chair)?: Total Help needed to walk in hospital room?: Total Help needed climbing 3-5 steps with a railing? : Total 6 Click Score: 7    End of Session Equipment Utilized During Treatment: Gait belt Activity Tolerance: Patient tolerated treatment well Patient left: in bed;with call bell/phone within reach Nurse Communication: Mobility status PT Visit Diagnosis: Other abnormalities of gait and mobility (R26.89);Muscle weakness (generalized) (M62.81);Difficulty in walking, not elsewhere classified (R26.2);Unsteadiness on feet (R26.81)     Time: 8581-8493 PT Time Calculation (min) (ACUTE ONLY): 48 min  Charges:    $Gait Training: 8-22 mins PT General Charges $$ ACUTE PT VISIT: 1 Visit                     Leita Sable, PT, DPT Acute Rehabilitation Services Secure Chat Preferred Office: [phone]    Leita JONETTA Sable 10/08/2023, 3:35 PM

## 2023-10-08 NOTE — Progress Notes (Signed)
 PROGRESS NOTE  Frank [name]. FMW:[id number] DOB: 10-23-1965   PCP: [name], MD  Patient is from: Home.  DOA: 07/03/2023 LOS: 97  Chief complaints No chief complaint on file.    Brief Narrative / Interim history: 58 year old PMH OSA CPAP, pathological fracture and multiple ORIF left Humerus likely due to multiple myeloma, A-fib not on anticoagulation, hyperlipidemia, tobacco use disorder who was feeling weak for 1 week, admitted to [facility] health with concern of sepsis, community-acquired pneumonia and acute encephalopathy. Patient was found to have lytic lesions, he was transferred to [facility] for oncology treatment. In the meantime he developed encephalopathy, acute renal failure. He remained in the ICU for 36 days. Underwent tracheostomy. He was also started on HD after CRRT,off of HD now. Subsequently PEG tube placed 5/29 and IR drain placement of right thigh hip infected hematoma on 7/11 . S/p PEG tube removal. Needs Port-A-Cath placement (for outpatient chemo), either during this hospitalization or as an outpatient. F/u IR recommendations for drain management. Started on heparin  drip on 7/17 because of b/l LE DVT. Plan for left arm amputation by [name] on 7/28.   Subjective: Seen and examined earlier this morning.  No major events overnight or this morning.  No complaints other than some pain in his arm.  Objective: Vitals:   10/08/23 0400 10/08/23 0539 10/08/23 0728 10/08/23 0829  BP: (!) 137/102 (!) 137/102 (!) 152/94   Pulse: 92  95   Resp: 18  18   Temp: 97.6 F (36.4 C)  97.8 F (36.6 C)   TempSrc: Axillary  Oral   SpO2: 98%  97% 97%  Weight:      Height:        Examination:  GENERAL: No apparent distress.  Nontoxic. HEENT: MMM.  Vision and hearing grossly intact.  NECK: Supple.  No apparent JVD.  RESP:  No IWOB.  Fair aeration bilaterally. CVS:  RRR. Heart sounds normal.  ABD/GI/GU: BS+. Abd soft, NTND.  MSK/EXT:  Moves extremities. No apparent  deformity. No edema.  SKIN: no apparent skin lesion or wound NEURO: Sleepy but wakes to voice.  Oriented appropriately.  BLE weakness. PSYCH: Calm. Normal affect.   Consultants:  Oncology Orthopedic surgery Infectious disease Palliative medicine   Assessment and plan: Closed fracture of left distal humerus History of Pathological fracture and multiple ORIF left Humerus 01/2023  -Plan for surgical amputation on 7/28 by [name]. -Pain control   Infected Right Thigh Hematoma/right thigh swelling: -Underwent IR drain placement for infected Hematoma on 7/11.  -Was on multiple regimen.  Now on Zyvox  until 7/26 and Augmentin  until 7/25   Sepsis in the setting of Staph Epi Bacteremia -blood culture with MRSE in 1 out of 4 bottles.  Completed a course of vancomycin  with the end date 6/20.  He did receive a line holiday.  Repeat blood culture 6/9 without growth.  Currently on Zyvox  until 7/26.   B/l LE DVT: US  venous doppler done on 7/16, positive for DVT in his RLE PTV, left CFV, SFJ, and PFV. -Continue heparin  infusion.  Will transition to DOAC after surgery.   Multiple myeloma: Hematology initiated chemotherapy on 08/21/2023. Initially given Velcade /Cytoxan . Now on Sarclisa /Kyprolis /Cytoxan .   -Cycle 2 chemotherapy will be done as an out patient. -Needs Port-A-cath placement, likely closer to discharge or as an outpatient. -Myeloma responding well to treatment, IgG decreased from 11,507 to WNL 1470. -Dr [name] on board.   Left sided pleuritic chest wall pain:  No  rib fractures seen on CXR -Prn analgesics   Aspergillosis with pneumonia -On Voriconazole  per ID.  End date for voriconazole  is 8/5, continue.   Acute Hypoxic Respiratory Failure-trach removed 6/11.  Decannulated.   Severe acute metabolic encephalopathy with hypoactive delirium, seizure disorder - Normal MRI of the brain.  LP negative for meningitis or encephalitis.  EEG negative for seizures but encephalopathy.   -Resolved   Insulin -dependent diabetes type 2- A1c 6.0, continue with sliding scale insulin .     AKI on CKD stage IIIA, Uremia,  Hyponatremia, Hypokalemia -Due to MM, hypercalcemia and ACE inhibitors?  Nephrology consulted, was on hemodialysis but eventually he tolerated being off HD, tunneled catheter discontinued per nephrology.  Renal function is stable.   Paroxysmal A-fib - Unable to  anticoagulate due to hematoma.  Rate controlled   Obstructive Sleep Apnea -Trach removed. CPAP    Anxiety/Bipolar disorder - Continue with Paxil .     Class II obesity Body mass index is 32.59 kg/m.  Severe protein calorie Malnutrition - Advanced to regular diet.  PEG tube was removed on 10/01/2023. Nutrition Problem: Severe Malnutrition Etiology: acute illness (prolonged illness, interuptions in feeding) Signs/Symptoms: moderate fat depletion, moderate muscle depletion, percent weight loss (9.6% x 1 month) Percent weight loss: 9.6 % Interventions: Ensure Enlive (each supplement provides 350kcal and 20 grams of protein), MVI, Liberalize Diet, Prostat, Juven  Pressure skin injury: Pressure Injury 07/05/23 Heel Left;Right Unstageable - Full thickness tissue loss in which the base of the injury is covered by slough (yellow, tan, gray, green or brown) and/or eschar (tan, brown or black) in the wound bed. (Active)  07/05/23 1108  Location: Heel  Location Orientation: Left;Right  Staging: Unstageable - Full thickness tissue loss in which the base of the injury is covered by slough (yellow, tan, gray, green or brown) and/or eschar (tan, brown or black) in the wound bed.  Wound Description (Comments):   DO NOT USE:  Present on Admission: Yes  Dressing Type None 10/04/23 2030     Pressure Injury 07/11/23 Sacrum Mid Unstageable - Full thickness tissue loss in which the base of the injury is covered by slough (yellow, tan, gray, green or brown) and/or eschar (tan, brown or black) in the wound bed. (Active)   07/11/23 1600  Location: Sacrum  Location Orientation: Mid  Staging: Unstageable - Full thickness tissue loss in which the base of the injury is covered by slough (yellow, tan, gray, green or brown) and/or eschar (tan, brown or black) in the wound bed.  Wound Description (Comments):   DO NOT USE:  Present on Admission: No  Dressing Type Gauze (Comment);Santyl ;Barrier Film (skin prep);Foam - Lift dressing to assess site every shift 10/07/23 1400     Pressure Injury 07/27/23 Buttocks Right;Lower (Active)  07/27/23 1700  Location: Buttocks  Location Orientation: Right;Lower  Staging:   Wound Description (Comments):   DO NOT USE:  Present on Admission: Yes  Dressing Type None 10/04/23 2030     Pressure Injury 08/25/23 Buttocks Left Stage 2 -  Partial thickness loss of dermis presenting as a shallow open injury with a red, pink wound bed without slough. (Active)  08/25/23   Location: Buttocks  Location Orientation: Left  Staging: Stage 2 -  Partial thickness loss of dermis presenting as a shallow open injury with a red, pink wound bed without slough.  Wound Description (Comments):   DO NOT USE:  Present on Admission: No  Dressing Type None 10/04/23 2030   DVT prophylaxis:  Place  and maintain sequential compression device Start: 07/14/23 1033  Code Status: Full code Family Communication: None at bedside Level of care: Telemetry Medical Status is: Inpatient due to acute illness   Final disposition: SNF   55 minutes with more than 50% spent in reviewing records, counseling patient/family and coordinating care.   Sch Meds:  Scheduled Meds:  (feeding supplement) PROSource Plus  30 mL Oral TID BM   acyclovir   400 mg Oral BID   amoxicillin -clavulanate  1 tablet Oral Q12H   collagenase    Topical Daily   [START ON 10/10/2023] diphenhydrAMINE   50 mg Oral Once   [START ON 10/10/2023] doxycycline  200mg  in 20 mL SWI irrigation / sclerosing solution for IR  500 mg Irrigation to XRAY    epoetin  alfa  40,000 Units Subcutaneous Q Wed-1800   feeding supplement  237 mL Oral BID WC   ferrous sulfate   325 mg Oral Q breakfast   guaiFENesin   600 mg Oral BID   hydrALAZINE   25 mg Oral Q8H   insulin  aspart  0-15 Units Subcutaneous TID AC & HS   insulin  aspart  2 Units Subcutaneous TID AC & HS   insulin  glargine-yfgn  10 Units Subcutaneous Q24H   ipratropium-albuterol   3 mL Nebulization BID   lidocaine   1 patch Transdermal Q24H   linezolid   600 mg Oral Q12H   montelukast   5 mg Oral QHS   multivitamin with minerals  1 tablet Oral Daily   nutrition supplement (JUVEN)  1 packet Oral BID AC   mouth rinse  15 mL Mouth Rinse 4 times per day   pantoprazole   40 mg Oral BID   PARoxetine   40 mg Oral Daily   polyethylene glycol  17 g Oral BID   [START ON 10/09/2023] predniSONE   50 mg Oral Q6H   senna  1 tablet Oral Daily   sodium chloride  flush  10-40 mL Intracatheter Q12H   umeclidinium-vilanterol  1 puff Inhalation Daily   valproic  acid  750 mg Oral BID   voriconazole   400 mg Oral Q12H   Continuous Infusions:  sodium chloride  Stopped (08/28/23 1208)   sodium chloride  Stopped (08/28/23 0839)   heparin  2,700 Units/hr (10/08/23 1724)   PRN Meds:.acetaminophen  (TYLENOL ) oral liquid 160 mg/5 mL **OR** acetaminophen , alum & mag hydroxide-simeth, benzonatate , hydrOXYzine , ipratropium-albuterol , ondansetron  (ZOFRAN ) IV, oxyCODONE , artificial tears, sodium chloride  flush  Antimicrobials: Anti-infectives (From admission, onward)    Start     Dose/Rate Route Frequency Ordered Stop   10/10/23 0900  doxycycline  200mg  in 20 mL SWI irrigation / sclerosing solution for IR       Note to Pharmacy: For irrigation only. WARNING - SCLEROSING AGENT  #5 vials sent to IR for procedure   500 mg Irrigation To Radiology 10/07/23 1418 10/11/23 0900   10/10/23 0000  doxycycline  (VIBRAMYCIN ) 500 mg in dextrose  5 % 250 mL IVPB  Status:  Discontinued        500 mg 125 mL/hr over 120 Minutes Intravenous To  Radiology 10/07/23 1402 10/07/23 1409   10/10/23 0000  doxycycline  200mg  in 20 mL SWI irrigation / sclerosing solution for IR  Status:  Discontinued        200 mg Irrigation To Radiology 10/07/23 1418 10/07/23 1456   10/10/23 0000  doxycycline  200mg  in 20 mL SWI irrigation / sclerosing solution for IR  Status:  Discontinued        200 mg Irrigation To Radiology 10/07/23 1440 10/07/23 1456   10/02/23 2200  linezolid  (ZYVOX ) tablet 600  mg        600 mg Oral Every 12 hours 10/01/23 0852 10/10/23 0959   10/02/23 1000  linezolid  (ZYVOX ) tablet 600 mg  Status:  Discontinued        600 mg Oral Every 12 hours 10/01/23 0723 10/01/23 0852   09/30/23 1300  vancomycin  variable dose per unstable renal function (pharmacist dosing)  Status:  Discontinued         Does not apply See admin instructions 09/30/23 1301 10/01/23 0725   09/29/23 1145  amoxicillin -clavulanate (AUGMENTIN ) 875-125 MG per tablet 1 tablet        1 tablet Oral Every 12 hours 09/29/23 1047 10/10/23 0959   09/27/23 0200  vancomycin  (VANCOREADY) IVPB 1500 mg/300 mL        1,500 mg 150 mL/hr over 120 Minutes Intravenous Every 12 hours 09/26/23 1402 09/30/23 1448   09/26/23 1500  cefTRIAXone  (ROCEPHIN ) 2 g in sodium chloride  0.9 % 100 mL IVPB  Status:  Discontinued        2 g 200 mL/hr over 30 Minutes Intravenous Every 24 hours 09/26/23 1402 09/29/23 1047   09/26/23 1315  vancomycin  (VANCOREADY) IVPB 2000 mg/400 mL        2,000 mg 200 mL/hr over 120 Minutes Intravenous  Once 09/26/23 1220 09/26/23 1656   09/26/23 1230  metroNIDAZOLE  (FLAGYL ) IVPB 500 mg  Status:  Discontinued        500 mg 100 mL/hr over 60 Minutes Intravenous Every 12 hours 09/26/23 1137 09/29/23 1138   09/22/23 2200  voriconazole  (VFEND ) tablet 400 mg        400 mg Oral Every 12 hours 09/22/23 1521 10/21/23 2359   09/11/23 2200  acyclovir  (ZOVIRAX ) 200 MG capsule 400 mg       Note to Pharmacy: Give after dialysis the pm dose, on dialysis days   400 mg Oral 2 times  daily 09/11/23 1551     09/11/23 2200  voriconazole  (VFEND ) tablet 350 mg  Status:  Discontinued        350 mg Oral Every 12 hours 09/11/23 1551 09/22/23 1521   09/07/23 1000  acyclovir  (ZOVIRAX ) 200 MG capsule 400 mg  Status:  Discontinued       Note to Pharmacy: Give after dialysis the pm dose, on dialysis days   400 mg Per Tube 2 times daily 09/07/23 0825 09/11/23 1551   09/03/23 1200  vancomycin  (VANCOCIN ) IVPB 1000 mg/200 mL premix  Status:  Discontinued        1,000 mg 200 mL/hr over 60 Minutes Intravenous Every M-W-F (Hemodialysis) 09/02/23 1021 09/03/23 0942   09/03/23 0942  vancomycin  variable dose per unstable renal function (pharmacist dosing)  Status:  Discontinued         Does not apply See admin instructions 09/03/23 0942 09/03/23 0945   09/02/23 0915  vancomycin  (VANCOCIN ) IVPB 1000 mg/200 mL premix        1,000 mg 200 mL/hr over 60 Minutes Intravenous  Once 09/02/23 0830 09/02/23 1215   09/01/23 0834  ceFAZolin  (ANCEF ) IVPB 2g/100 mL premix        over 30 Minutes Intravenous Continuous PRN 09/01/23 0834 09/01/23 0834   08/29/23 0914  vancomycin  variable dose per unstable renal function (pharmacist dosing)  Status:  Discontinued         Does not apply See admin instructions 08/29/23 0914 09/02/23 1021   08/27/23 1400  vancomycin  (VANCOCIN ) IVPB 1000 mg/200 mL premix        1,000 mg 200  mL/hr over 60 Minutes Intravenous  Once 08/27/23 0920 08/27/23 1653   08/25/23 1200  vancomycin  (VANCOCIN ) IVPB 1000 mg/200 mL premix        1,000 mg 200 mL/hr over 60 Minutes Intravenous Every M-W-F (Hemodialysis) 08/24/23 1405 08/29/23 1240   08/24/23 1445  vancomycin  (VANCOREADY) IVPB 2000 mg/400 mL        2,000 mg 200 mL/hr over 120 Minutes Intravenous  Once 08/24/23 1353 08/25/23 0323   08/14/23 0600  ceFAZolin  (ANCEF ) IVPB 2g/100 mL premix        2 g 200 mL/hr over 30 Minutes Intravenous To Radiology 08/13/23 1055 08/14/23 0606   08/09/23 1000  acyclovir  (ZOVIRAX ) 200 MG capsule 200  mg  Status:  Discontinued       Note to Pharmacy: Give after dialysis the pm dose, on dialysis days   200 mg Per Tube 2 times daily 08/08/23 2358 09/07/23 0825   08/09/23 0100  acyclovir  (ZOVIRAX ) 200 MG capsule 200 mg       Note to Pharmacy: Give after dialysis the pm dose, on dialysis days   200 mg Per Tube  Once 08/09/23 0009 08/09/23 0013   08/08/23 2345  acyclovir  (ZOVIRAX ) 200 MG capsule 200 mg  Status:  Discontinued       Note to Pharmacy: Give after dialysis the pm dose, on dialysis days   200 mg Oral 2 times daily 08/08/23 2257 08/08/23 2358   08/07/23 1530  ceFAZolin  (ANCEF ) IVPB 2g/100 mL premix        over 30 Minutes Intravenous Continuous PRN 08/07/23 1555 08/07/23 1530   08/05/23 2200  voriconazole  (VFEND ) tablet 350 mg  Status:  Discontinued        350 mg Per Tube Every 12 hours 08/05/23 1511 09/11/23 1551   08/01/23 1200  vancomycin  (VANCOCIN ) IVPB 1000 mg/200 mL premix        1,000 mg 200 mL/hr over 60 Minutes Intravenous Every M-W-F (Hemodialysis) 07/31/23 0649 08/01/23 1652   07/30/23 1200  vancomycin  (VANCOCIN ) IVPB 1000 mg/200 mL premix        1,000 mg 200 mL/hr over 60 Minutes Intravenous Every M-W-F (Hemodialysis) 07/30/23 0851 07/30/23 1241   07/29/23 0900  vancomycin  (VANCOCIN ) IVPB 1000 mg/200 mL premix        1,000 mg 200 mL/hr over 60 Minutes Intravenous  Once 07/29/23 0812 07/29/23 0943   07/28/23 1204  vancomycin  variable dose per unstable renal function (pharmacist dosing)  Status:  Discontinued         Does not apply See admin instructions 07/28/23 1204 07/31/23 0649   07/28/23 1000  voriconazole  (VFEND ) 350 mg in sodium chloride  0.9 % 100 mL IVPB  Status:  Discontinued       Placed in Followed by Linked Group   4 mg/kg  88.6 kg (Adjusted) 67.5 mL/hr over 120 Minutes Intravenous Every 12 hours 07/28/23 0748 08/05/23 1511   07/27/23 1000  voriconazole  (VFEND ) 400 mg in sodium chloride  0.9 % 100 mL IVPB  Status:  Discontinued       Placed in Followed by  Linked Group   4 mg/kg  100.6 kg 70 mL/hr over 120 Minutes Intravenous Every 12 hours 07/26/23 0826 07/28/23 0748   07/27/23 0945  vancomycin  (VANCOREADY) IVPB 2000 mg/400 mL        2,000 mg 200 mL/hr over 120 Minutes Intravenous  Once 07/27/23 0849 07/27/23 1237   07/26/23 1515  Ampicillin -Sulbactam (UNASYN ) 3 g in sodium chloride  0.9 % 100 mL IVPB  Status:  Discontinued        3 g 200 mL/hr over 30 Minutes Intravenous Every 8 hours 07/26/23 1418 07/27/23 0830   07/26/23 0915  voriconazole  (VFEND ) 600 mg in sodium chloride  0.9 % 150 mL IVPB       Placed in Followed by Linked Group   6 mg/kg  100.6 kg 105 mL/hr over 120 Minutes Intravenous Every 12 hours 07/26/23 0826 07/27/23 0048   07/26/23 0900  Ampicillin -Sulbactam (UNASYN ) 3 g in sodium chloride  0.9 % 100 mL IVPB  Status:  Discontinued        3 g 200 mL/hr over 30 Minutes Intravenous Every 12 hours 07/26/23 0826 07/26/23 1418   07/23/23 1000  levofloxacin  (LEVAQUIN ) IVPB 500 mg  Status:  Discontinued       Placed in Followed by Linked Group   500 mg 100 mL/hr over 60 Minutes Intravenous Every 24 hours 07/22/23 1213 07/23/23 1001   07/23/23 1000  levofloxacin  (LEVAQUIN ) IVPB 500 mg  Status:  Discontinued       Placed in Followed by Linked Group   500 mg 100 mL/hr over 60 Minutes Intravenous Every 24 hours 07/23/23 1001 07/28/23 0804   07/22/23 1300  levofloxacin  (LEVAQUIN ) IVPB 750 mg       Placed in Followed by Linked Group   750 mg 100 mL/hr over 90 Minutes Intravenous  Once 07/22/23 1213 07/22/23 1542   07/21/23 1600  voriconazole  (VFEND ) 300 mg in sodium chloride  0.9 % 100 mL IVPB  Status:  Discontinued        300 mg 65 mL/hr over 120 Minutes Intravenous Every 12 hours 07/21/23 1430 07/22/23 1415   07/21/23 1000  voriconazole  (VFEND ) tablet 200 mg  Status:  Discontinued        200 mg Oral Every 12 hours 07/20/23 0944 07/21/23 1015   07/20/23 1030  voriconazole  (VFEND ) 530 mg in sodium chloride  0.9 % 150 mL IVPB         6 mg/kg  88.6 kg (Adjusted) 101.5 mL/hr over 120 Minutes Intravenous Every 12 hours 07/20/23 0944 07/21/23 0030   07/19/23 1400  meropenem  (MERREM ) 1 g in sodium chloride  0.9 % 100 mL IVPB  Status:  Discontinued        1 g 200 mL/hr over 30 Minutes Intravenous Every 8 hours 07/19/23 1143 07/22/23 1213   07/18/23 0600  meropenem  (MERREM ) 1 g in sodium chloride  0.9 % 100 mL IVPB  Status:  Discontinued        1 g 200 mL/hr over 30 Minutes Intravenous Every 24 hours 07/17/23 0916 07/19/23 1143   07/17/23 0700  meropenem  (MERREM ) 1 g in sodium chloride  0.9 % 100 mL IVPB  Status:  Discontinued        1 g 200 mL/hr over 30 Minutes Intravenous Every 12 hours 07/17/23 0601 07/17/23 0916   07/15/23 1300  vancomycin  (VANCOCIN ) IVPB 1000 mg/200 mL premix  Status:  Discontinued        1,000 mg 200 mL/hr over 60 Minutes Intravenous Every 24 hours 07/14/23 1129 07/22/23 1319   07/14/23 1215  vancomycin  (VANCOREADY) IVPB 1750 mg/350 mL        1,750 mg 175 mL/hr over 120 Minutes Intravenous  Once 07/14/23 1129 07/14/23 1449   07/09/23 1800  piperacillin -tazobactam (ZOSYN ) IVPB 3.375 g  Status:  Discontinued        3.375 g 12.5 mL/hr over 240 Minutes Intravenous Every 12 hours 07/09/23 0756 07/09/23 0844   07/09/23 1400  piperacillin -tazobactam (  ZOSYN ) IVPB 3.375 g  Status:  Discontinued        3.375 g 12.5 mL/hr over 240 Minutes Intravenous Every 8 hours 07/09/23 0844 07/17/23 0557   07/08/23 1300  piperacillin -tazobactam (ZOSYN ) IVPB 3.375 g  Status:  Discontinued        3.375 g 12.5 mL/hr over 240 Minutes Intravenous Every 8 hours 07/08/23 1129 07/09/23 0756   07/05/23 2200  azithromycin  (ZITHROMAX ) 500 mg in sodium chloride  0.9 % 250 mL IVPB        500 mg 250 mL/hr over 60 Minutes Intravenous Daily at bedtime 07/05/23 2022 07/06/23 2302   07/05/23 2115  azithromycin  (ZITHROMAX ) 250 mg in dextrose  5 % 125 mL IVPB  Status:  Discontinued       Note to Pharmacy: Missed dose this morning   250  mg 127.5 mL/hr over 60 Minutes Intravenous Every 24 hours 07/05/23 2016 07/05/23 2021   07/04/23 0900  fluconazole  (DIFLUCAN ) IVPB 200 mg  Status:  Discontinued        200 mg 100 mL/hr over 60 Minutes Intravenous Every 48 hours 07/04/23 0722 07/09/23 0809   07/03/23 0600  cefTRIAXone  (ROCEPHIN ) 1 g in sodium chloride  0.9 % 100 mL IVPB  Status:  Discontinued        1 g 200 mL/hr over 30 Minutes Intravenous Every 24 hours 07/03/23 0508 07/08/23 1104   07/03/23 0600  azithromycin  (ZITHROMAX ) tablet 250 mg  Status:  Discontinued        250 mg Oral Daily 07/03/23 0508 07/05/23 2016        I have personally reviewed the following labs and images: CBC: Recent Labs  Lab 10/02/23 0848 10/03/23 0719 10/04/23 0328 10/05/23 0533 10/06/23 0614 10/07/23 0638 10/08/23 0530  WBC 4.4 3.8* 4.8 4.4 5.2 5.1 6.0  NEUTROABS 2.2 1.8 2.2 2.0 2.9  --   --   HGB 8.9* 8.9* 8.7* 8.9* 9.4* 9.6* 9.5*  HCT 27.0* 27.1* 26.9* 27.4* 29.2* 29.1* 28.9*  MCV 106.7* 105.4* 107.6* 107.5* 107.7* 107.4* 106.6*  PLT 392 363 421* 432* 441* 464* 481*   BMP &GFR Recent Labs  Lab 10/03/23 0719 10/04/23 0328 10/05/23 0533 10/06/23 0614 10/07/23 0638  NA 133* 133* 133* 134* 132*  K 3.9 4.3 4.7 5.3* 4.6  CL 99 102 99 100 101  CO2 23 24 24 24 22   GLUCOSE 91 93 85 94 87  BUN 32* 30* 27* 22* 23*  CREATININE 0.82 1.01 1.03 1.01 1.24  CALCIUM  8.6* 8.5* 8.4* 8.6* 8.6*  MG  --   --   --   --  1.8   Estimated Creatinine Clearance: 81.8 mL/min (by C-G formula based on SCr of 1.24 mg/dL). Liver & Pancreas: Recent Labs  Lab 10/03/23 0719 10/04/23 0328 10/05/23 0533 10/06/23 9385 10/07/23 0638  AST 9* 9* 8* 10* 9*  ALT 7 6 6 7 5   ALKPHOS 77 78 84 89 83  BILITOT 0.5 0.6 0.2 0.4 0.5  PROT 5.3* 5.2* 5.3* 5.5* 5.5*  ALBUMIN  2.0* 2.0* 2.0* 2.2* 2.1*   No results for input(s): LIPASE, AMYLASE in the last 168 hours. No results for input(s): AMMONIA in the last 168 hours. Diabetic: No results for input(s):  HGBA1C in the last 72 hours. Recent Labs  Lab 10/07/23 1719 10/07/23 2029 10/08/23 0729 10/08/23 1204 10/08/23 1628  GLUCAP 118* 166* 135* 146* 131*   Cardiac Enzymes: No results for input(s): CKTOTAL, CKMB, CKMBINDEX, TROPONINI in the last 168 hours. No results for input(s): PROBNP in  the last 8760 hours. Coagulation Profile: No results for input(s): INR, PROTIME in the last 168 hours. Thyroid Function Tests: No results for input(s): TSH, T4TOTAL, FREET4, T3FREE, THYROIDAB in the last 72 hours. Lipid Profile: No results for input(s): CHOL, HDL, LDLCALC, TRIG, CHOLHDL, LDLDIRECT in the last 72 hours. Anemia Panel: No results for input(s): VITAMINB12, FOLATE, FERRITIN, TIBC, IRON, RETICCTPCT in the last 72 hours. Urine analysis:    Component Value Date/Time   COLORURINE AMBER (A) 08/23/2023 0618   APPEARANCEUR CLOUDY (A) 08/23/2023 0618   LABSPEC 1.017 08/23/2023 0618   PHURINE 5.0 08/23/2023 0618   GLUCOSEU NEGATIVE 08/23/2023 0618   HGBUR NEGATIVE 08/23/2023 0618   BILIRUBINUR NEGATIVE 08/23/2023 0618   KETONESUR NEGATIVE 08/23/2023 0618   PROTEINUR 30 (A) 08/23/2023 0618   NITRITE NEGATIVE 08/23/2023 0618   LEUKOCYTESUR LARGE (A) 08/23/2023 0618   Sepsis Labs: Invalid input(s): PROCALCITONIN, LACTICIDVEN  Microbiology: No results found for this or any previous visit (from the past 240 hours).  Radiology Studies: No results found.    Marshell Rieger T. Setareh Rom [facility]  If 7PM-7AM, please contact night-coverage [email or web address] 10/08/2023, 5:49 PM

## 2023-10-08 NOTE — Progress Notes (Addendum)
 Pt has healed trach site that has suture remaining in place site is scabbed/ healed over. MD to assess if suture should be removed ?   Pt is on 24 hour urine collection started 10/07/23 at 1500- END TIME 10/08/23 at 1500.  Ice bath continued over night for the urine collection.   Pt on heparin  drip at 43mL/hr.  JP drain to R hip hematoma, dark red/ brown output TID 5cc flushes per IR recommendation.

## 2023-10-09 ENCOUNTER — Other Ambulatory Visit: Payer: Self-pay

## 2023-10-09 DIAGNOSIS — B449 Aspergillosis, unspecified: Secondary | ICD-10-CM | POA: Diagnosis not present

## 2023-10-09 DIAGNOSIS — C9000 Multiple myeloma not having achieved remission: Secondary | ICD-10-CM | POA: Diagnosis not present

## 2023-10-09 DIAGNOSIS — A419 Sepsis, unspecified organism: Secondary | ICD-10-CM | POA: Diagnosis not present

## 2023-10-09 DIAGNOSIS — J181 Lobar pneumonia, unspecified organism: Secondary | ICD-10-CM | POA: Diagnosis not present

## 2023-10-09 LAB — CBC
HCT: 29.3 % — ABNORMAL LOW (ref 39.0–52.0)
Hemoglobin: 9.5 g/dL — ABNORMAL LOW (ref 13.0–17.0)
MCH: 34.7 pg — ABNORMAL HIGH (ref 26.0–34.0)
MCHC: 32.4 g/dL (ref 30.0–36.0)
MCV: 106.9 fL — ABNORMAL HIGH (ref 80.0–100.0)
Platelets: 430 K/uL — ABNORMAL HIGH (ref 150–400)
RBC: 2.74 MIL/uL — ABNORMAL LOW (ref 4.22–5.81)
RDW: 19.3 % — ABNORMAL HIGH (ref 11.5–15.5)
WBC: 5.3 K/uL (ref 4.0–10.5)
nRBC: 0 % (ref 0.0–0.2)

## 2023-10-09 LAB — GLUCOSE, CAPILLARY
Glucose-Capillary: 98 mg/dL (ref 70–99)
Glucose-Capillary: 225 mg/dL — ABNORMAL HIGH (ref 70–99)
Glucose-Capillary: 96 mg/dL (ref 70–99)
Glucose-Capillary: 163 mg/dL — ABNORMAL HIGH (ref 70–99)

## 2023-10-09 LAB — HEPARIN LEVEL (UNFRACTIONATED): Heparin Unfractionated: 0.29 [IU]/mL — ABNORMAL LOW (ref 0.30–0.70)

## 2023-10-09 NOTE — Progress Notes (Signed)
 Occupational Therapy Treatment Patient Details Name: Frank [name]. MRN: [id number] DOB: Dec 16, 1965 Today's Date: 10/09/2023   History of present illness 58 y.o. male transferred from [facility] 07/03/23 to [facility] for management of newly diagnosed plasma cell myeloma with weakness and AMS. Pt also with AKI and metabolic encephalopathy. 5/10 transfer to [facility] for iHD. 4/22 Intubated and chemo initiated. 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 5/6 IVIG started. 5/8-6/11 trach. 5/12 iHD initiated. 5/29 PEG placed. 6/5 chemo initiated. PMHx:Lt THA, carpal tunnel syndrome, Lt TKA, Lt humerus fx, PAF, HTN, HLD, bipolar disorder, obesity, T2DM   OT comments  [facility] Patient OT/PT session: Patient performed rolling in bed to donn shorts and was able to get to EOB with verbal cues, increased time, and mod assist. Patient was provided setup for sliding board transfer and education on technique with patient able to perform with min assist +2. Patient able to perform 3 stands from wheelchair with use of foot of bed for support. Patient assisted back to bed with use of Stedy. Patient is making gains with sit to stands and transfers. Discharge recommendations continue to be appropriate. Acute OT to continue to follow with [facility] program.       If plan is discharge home, recommend the following:  Two people to help with walking and/or transfers;Assistance with cooking/housework;Assistance with feeding;Direct supervision/assist for medications management;Direct supervision/assist for financial management;Assist for transportation;Help with stairs or ramp for entrance;A lot of help with bathing/dressing/bathroom   Equipment Recommendations  Other (comment) (TBD)    Recommendations for Other Services      Precautions / Restrictions Precautions Precautions: Fall Recall of Precautions/Restrictions: Impaired Precaution/Restrictions Comments: plan for amputation, Dr Kendal okay with arm in sling Required Braces or Orthoses:  Sling Restrictions Weight Bearing Restrictions Per Provider Order: Yes LUE Weight Bearing Per Provider Order: Non weight bearing Other Position/Activity Restrictions: Lt humerus non-union, splinted. no elbow ROM, but shoulder ROM ok per secure chat with Dr. Jonel 7/1       Mobility Bed Mobility Overal bed mobility: Needs Assistance Bed Mobility: Rolling, Supine to Sit, Sit to Supine Rolling: Max assist, Used rails   Supine to sit: Mod assist, HOB elevated, Used rails Sit to supine: +2 for physical assistance, Total assist   General bed mobility comments: patient required assistance with raising trunk    Transfers Overall transfer level: Needs assistance Equipment used: Ambulation equipment used, Sliding board Transfers: Sit to/from Stand, Bed to chair/wheelchair/BSC Sit to Stand: +2 physical assistance, +2 safety/equipment, Mod assist          Lateral/Scoot Transfers: +2 physical assistance, Min assist General transfer comment: Slide board transfer bed>wheelchair with slide board, followed by 3 sit>stand transfers to bed rail for support. At end of session Stedy utilized for back to bed. Transfer via Lift Equipment: Stedy   Balance Overall balance assessment: Needs assistance Sitting-balance support: Single extremity supported, Feet supported Sitting balance-Leahy Scale: Fair Sitting balance - Comments: EOB Postural control: Right lateral lean Standing balance support: Single extremity supported, During functional activity, Reliant on assistive device for balance Standing balance-Leahy Scale: Zero Standing balance comment: reliant on LUE support and assistance with Stedy and therapist                           ADL either performed or assessed with clinical judgement   ADL Overall ADL's : Needs assistance/impaired  Upper Body Dressing : Moderate assistance;Sitting Upper Body Dressing Details (indicate cue type and reason): to donn gown  and sling seated on EOB Lower Body Dressing: Bed level;Maximal assistance Lower Body Dressing Details (indicate cue type and reason): shorts donned with rolling in bed, patient able to donn slip on shoes               General ADL Comments: focused on standing to increase independence with transfers    Extremity/Trunk Assessment              Vision       Perception     Praxis     Communication Communication Communication: Impaired Factors Affecting Communication: Reduced clarity of speech   Cognition Arousal: Alert Behavior During Therapy: WFL for tasks assessed/performed Cognition: Cognition impaired     Awareness: Intellectual awareness intact   Attention impairment (select first level of impairment): Sustained attention Executive functioning impairment (select all impairments): Problem solving                   Following commands: Impaired Following commands impaired: Follows one step commands with increased time      Cueing   Cueing Techniques: Verbal cues, Tactile cues, Visual cues  Exercises      Shoulder Instructions       General Comments VSS on RA    Pertinent Vitals/ Pain       Pain Assessment Pain Assessment: Faces Faces Pain Scale: Hurts even more Pain Location: left shoulder and Right knee when WBing Pain Descriptors / Indicators: Discomfort, Grimacing, Sharp, Moaning Pain Intervention(s): Limited activity within patient's tolerance, Monitored during session, Repositioned  Home Living                                          Prior Functioning/Environment              Frequency  Min 1X/week Environmental education officer Program)        Progress Toward Goals  OT Goals(current goals can now be found in the care plan section)  Progress towards OT goals: Progressing toward goals  Acute Rehab OT Goals Patient Stated Goal: to get more independent OT Goal Formulation: With patient Time For Goal Achievement:  10/09/23 Potential to Achieve Goals: Fair ADL Goals Pt Will Perform Grooming: with supervision;with set-up;sitting Pt Will Perform Upper Body Dressing: with contact guard assist;sitting Pt Will Transfer to Toilet: bedside commode Pt/caregiver will Perform Home Exercise Program: Right Upper extremity;Increased strength;With written HEP provided;Independently Additional ADL Goal #1: Pt will complete bed mobility with mod A as a precursor to EOB ADLs  Plan      Co-evaluation    PT/OT/SLP Co-Evaluation/Treatment: Yes Reason for Co-Treatment: For patient/therapist safety;To address functional/ADL transfers PT goals addressed during session: Mobility/safety with mobility;Balance OT goals addressed during session: ADL's and self-care      AM-PAC OT 6 Clicks Daily Activity     Outcome Measure   Help from another person eating meals?: A Little Help from another person taking care of personal grooming?: A Little Help from another person toileting, which includes using toliet, bedpan, or urinal?: Total Help from another person bathing (including washing, rinsing, drying)?: A Lot Help from another person to put on and taking off regular upper body clothing?: A Lot Help from another person to put on and taking off regular lower body clothing?: A Lot 6 Click Score:  13    End of Session Equipment Utilized During Treatment: Gait belt;Other (comment) (sliding board and Stedy)  OT Visit Diagnosis: Unsteadiness on feet (R26.81);Other abnormalities of gait and mobility (R26.89);Muscle weakness (generalized) (M62.81);Pain Pain - Right/Left: Right Pain - part of body: Knee (and left shoulder)   Activity Tolerance Patient tolerated treatment well   Patient Left in bed;with call bell/phone within reach;with family/visitor present   Nurse Communication Mobility status        Time: 8669-8581 OT Time Calculation (min): 48 min  Charges: OT General Charges $OT Visit: 1 Visit OT  Treatments $Self Care/Home Management : 8-22 mins $Therapeutic Activity: 8-22 mins  [name], OTA Acute Rehabilitation Services  Office [phone]   [name] 10/09/2023, 3:10 PM

## 2023-10-09 NOTE — Progress Notes (Signed)
 PROGRESS NOTE  Frank [name]. FMW:[id number] DOB: [birth year]   PCP: [name], MD  Patient is from: Home.  DOA: 07/03/2023 LOS: 98  Chief complaints No chief complaint on file.    Brief Narrative / Interim history: 58 year old PMH OSA CPAP, pathological fracture and multiple ORIF left Humerus likely due to multiple myeloma, A-fib not on anticoagulation, hyperlipidemia, tobacco use disorder who was feeling weak for 1 week, admitted to [facility] with concern of sepsis, community-acquired pneumonia and acute encephalopathy. Patient was found to have lytic lesions, he was transferred to [facility] for oncology treatment. In the meantime he developed encephalopathy, acute renal failure. He remained in the ICU for 36 days. Underwent tracheostomy. He was also started on HD after CRRT,off of HD now. Subsequently PEG tube placed 5/29 and IR drain placement of right thigh hip infected hematoma on 7/11 . S/p PEG tube removal. Needs Port-A-Cath placement (for outpatient chemo), either during this hospitalization or as an outpatient. F/u IR recommendations for drain management. Started on heparin  drip on 7/17 because of b/l LE DVT. Plan for left arm amputation by [name] on 7/28.   Subjective: Seen and examined earlier this morning.  No major events overnight or this morning.  No complaints other than some pain in his arm.  Objective: Vitals:   10/09/23 0427 10/09/23 0429 10/09/23 0821 10/09/23 0854  BP: (!) 147/96 (!) 147/96 (!) 146/104   Pulse: 91  95   Resp: 20  18   Temp: 98.1 F (36.7 C)  98.4 F (36.9 C)   TempSrc: Axillary  Oral   SpO2: 96%  97% 97%  Weight:      Height:        Examination:  GENERAL: No apparent distress.  Nontoxic. HEENT: MMM.  Vision and hearing grossly intact.  NECK: Supple.  No apparent JVD.  RESP:  No IWOB.  Fair aeration bilaterally. CVS:  RRR. Heart sounds normal.  ABD/GI/GU: BS+. Abd soft, NTND.  MSK/EXT:  Moves extremities. No apparent  deformity. No edema.  SKIN: no apparent skin lesion or wound NEURO: Awake and alert.  Oriented x 4.  BLE weakness. PSYCH: Calm. Normal affect.   Consultants:  Oncology Orthopedic surgery Infectious disease Palliative medicine   Assessment and plan: Closed fracture of left distal humerus History of Pathological fracture and multiple ORIF left Humerus 01/2023  -Plan for surgical amputation on 7/28 by [name]. -Pain fairly controlled.   Infected Right Thigh Hematoma/right thigh swelling: -Underwent IR drain placement for infected Hematoma on 7/11.  -Was on multiple regimen.  Now on Zyvox  until 7/26 and Augmentin  until 7/25   Sepsis in the setting of Staph Epi Bacteremia -blood culture with MRSE in 1 out of 4 bottles.  Completed a course of vancomycin  with the end date 6/20.  He did receive a line holiday.  Repeat blood culture 6/9 without growth.  Currently on Zyvox  until 7/26.   B/l LE DVT: US  venous doppler done on 7/16, positive for DVT in his RLE PTV, left CFV, SFJ, and PFV. -Continue heparin  infusion.  Will transition to DOAC after surgery.   Multiple myeloma: Hematology initiated chemotherapy on 08/21/2023. Initially given Velcade /Cytoxan . Now on Sarclisa /Kyprolis /Cytoxan .   -Cycle 2 chemotherapy will be done as an out patient. -Needs Port-A-cath placement, likely closer to discharge or as an outpatient. -Myeloma responding well to treatment, IgG decreased from 11,507 to WNL 1470. -Dr [name] on board.   Left sided pleuritic chest wall pain:  No  rib fractures seen on CXR -Prn analgesics   Aspergillosis with pneumonia -On Voriconazole  per ID.  End date for voriconazole  is 8/5, continue.   Acute Hypoxic Respiratory Failure-trach removed 6/11.  Decannulated.   Severe acute metabolic encephalopathy with hypoactive delirium, seizure disorder - Normal MRI of the brain.  LP negative for meningitis or encephalitis.  EEG negative for seizures but encephalopathy.  -Resolved    Insulin -dependent diabetes type 2- A1c 6.0, continue with sliding scale insulin .     AKI on CKD stage IIIA, Uremia,  Hyponatremia, Hypokalemia -Due to MM, hypercalcemia and ACE inhibitors?  Nephrology consulted, was on hemodialysis but eventually he tolerated being off HD, tunneled catheter discontinued per nephrology.  Renal function is stable.   Paroxysmal A-fib - Unable to  anticoagulate due to hematoma.  Rate controlled   Obstructive Sleep Apnea -Trach removed. CPAP    Anxiety/Bipolar disorder - Continue with Paxil .     Class II obesity Body mass index is 32.59 kg/m.  Severe protein calorie Malnutrition - Advanced to regular diet.  PEG tube was removed on 10/01/2023. Nutrition Problem: Severe Malnutrition Etiology: acute illness (prolonged illness, interuptions in feeding) Signs/Symptoms: moderate fat depletion, moderate muscle depletion, percent weight loss (9.6% x 1 month) Percent weight loss: 9.6 % Interventions: Ensure Enlive (each supplement provides 350kcal and 20 grams of protein), MVI, Liberalize Diet, Prostat, Juven  Pressure skin injury: Pressure Injury 07/05/23 Heel Left;Right Unstageable - Full thickness tissue loss in which the base of the injury is covered by slough (yellow, tan, gray, green or brown) and/or eschar (tan, brown or black) in the wound bed. (Active)  07/05/23 1108  Location: Heel  Location Orientation: Left;Right  Staging: Unstageable - Full thickness tissue loss in which the base of the injury is covered by slough (yellow, tan, gray, green or brown) and/or eschar (tan, brown or black) in the wound bed.  Wound Description (Comments):   DO NOT USE:  Present on Admission: Yes  Dressing Type None 10/04/23 2030     Pressure Injury 07/11/23 Sacrum Mid Unstageable - Full thickness tissue loss in which the base of the injury is covered by slough (yellow, tan, gray, green or brown) and/or eschar (tan, brown or black) in the wound bed. (Active)  07/11/23 1600   Location: Sacrum  Location Orientation: Mid  Staging: Unstageable - Full thickness tissue loss in which the base of the injury is covered by slough (yellow, tan, gray, green or brown) and/or eschar (tan, brown or black) in the wound bed.  Wound Description (Comments):   DO NOT USE:  Present on Admission: No  Dressing Type Gauze (Comment);Santyl ;Barrier Film (skin prep);Foam - Lift dressing to assess site every shift 10/07/23 1400     Pressure Injury 07/27/23 Buttocks Right;Lower (Active)  07/27/23 1700  Location: Buttocks  Location Orientation: Right;Lower  Staging:   Wound Description (Comments):   DO NOT USE:  Present on Admission: Yes  Dressing Type None 10/04/23 2030     Pressure Injury 08/25/23 Buttocks Left Stage 2 -  Partial thickness loss of dermis presenting as a shallow open injury with a red, pink wound bed without slough. (Active)  08/25/23   Location: Buttocks  Location Orientation: Left  Staging: Stage 2 -  Partial thickness loss of dermis presenting as a shallow open injury with a red, pink wound bed without slough.  Wound Description (Comments):   DO NOT USE:  Present on Admission: No  Dressing Type None 10/04/23 2030   DVT prophylaxis:  Place  and maintain sequential compression device Start: 07/14/23 1033  Code Status: Full code Family Communication: None at bedside Level of care: Telemetry Medical Status is: Inpatient due to acute illness   Final disposition: SNF   35 minutes with more than 50% spent in reviewing records, counseling patient/family and coordinating care.   Sch Meds:  Scheduled Meds:  (feeding supplement) PROSource Plus  30 mL Oral TID BM   acyclovir   400 mg Oral BID   amoxicillin -clavulanate  1 tablet Oral Q12H   collagenase    Topical Daily   [START ON 10/10/2023] diphenhydrAMINE   50 mg Oral Once   [START ON 10/10/2023] doxycycline  200mg  in 20 mL SWI irrigation / sclerosing solution for IR  500 mg Irrigation to XRAY   epoetin  alfa   40,000 Units Subcutaneous Q Wed-1800   feeding supplement  237 mL Oral BID WC   ferrous sulfate   325 mg Oral Q breakfast   guaiFENesin   600 mg Oral BID   hydrALAZINE   25 mg Oral Q8H   insulin  aspart  0-15 Units Subcutaneous TID AC & HS   insulin  aspart  2 Units Subcutaneous TID AC & HS   insulin  glargine-yfgn  10 Units Subcutaneous Q24H   ipratropium-albuterol   3 mL Nebulization BID   lidocaine   1 patch Transdermal Q24H   linezolid   600 mg Oral Q12H   montelukast   5 mg Oral QHS   multivitamin with minerals  1 tablet Oral Daily   nutrition supplement (JUVEN)  1 packet Oral BID AC   mouth rinse  15 mL Mouth Rinse 4 times per day   pantoprazole   40 mg Oral BID   PARoxetine   40 mg Oral Daily   polyethylene glycol  17 g Oral BID   predniSONE   50 mg Oral Q6H   senna  1 tablet Oral Daily   sodium chloride  flush  10-40 mL Intracatheter Q12H   umeclidinium-vilanterol  1 puff Inhalation Daily   valproic  acid  750 mg Oral BID   voriconazole   400 mg Oral Q12H   Continuous Infusions:  sodium chloride  Stopped (08/28/23 1208)   sodium chloride  Stopped (08/28/23 0839)   heparin  2,700 Units/hr (10/09/23 1149)   PRN Meds:.acetaminophen  (TYLENOL ) oral liquid 160 mg/5 mL **OR** acetaminophen , alum & mag hydroxide-simeth, benzonatate , hydrOXYzine , ipratropium-albuterol , ondansetron  (ZOFRAN ) IV, oxyCODONE , artificial tears, sodium chloride  flush  Antimicrobials: Anti-infectives (From admission, onward)    Start     Dose/Rate Route Frequency Ordered Stop   10/10/23 0900  doxycycline  200mg  in 20 mL SWI irrigation / sclerosing solution for IR       Note to Pharmacy: For irrigation only. WARNING - SCLEROSING AGENT  #5 vials sent to IR for procedure   500 mg Irrigation To Radiology 10/07/23 1418 10/11/23 0900   10/10/23 0000  doxycycline  (VIBRAMYCIN ) 500 mg in dextrose  5 % 250 mL IVPB  Status:  Discontinued        500 mg 125 mL/hr over 120 Minutes Intravenous To Radiology 10/07/23 1402 10/07/23 1409    10/10/23 0000  doxycycline  200mg  in 20 mL SWI irrigation / sclerosing solution for IR  Status:  Discontinued        200 mg Irrigation To Radiology 10/07/23 1418 10/07/23 1456   10/10/23 0000  doxycycline  200mg  in 20 mL SWI irrigation / sclerosing solution for IR  Status:  Discontinued        200 mg Irrigation To Radiology 10/07/23 1440 10/07/23 1456   10/02/23 2200  linezolid  (ZYVOX ) tablet 600 mg  600 mg Oral Every 12 hours 10/01/23 0852 10/10/23 0959   10/02/23 1000  linezolid  (ZYVOX ) tablet 600 mg  Status:  Discontinued        600 mg Oral Every 12 hours 10/01/23 0723 10/01/23 0852   09/30/23 1300  vancomycin  variable dose per unstable renal function (pharmacist dosing)  Status:  Discontinued         Does not apply See admin instructions 09/30/23 1301 10/01/23 0725   09/29/23 1145  amoxicillin -clavulanate (AUGMENTIN ) 875-125 MG per tablet 1 tablet        1 tablet Oral Every 12 hours 09/29/23 1047 10/10/23 0959   09/27/23 0200  vancomycin  (VANCOREADY) IVPB 1500 mg/300 mL        1,500 mg 150 mL/hr over 120 Minutes Intravenous Every 12 hours 09/26/23 1402 09/30/23 1448   09/26/23 1500  cefTRIAXone  (ROCEPHIN ) 2 g in sodium chloride  0.9 % 100 mL IVPB  Status:  Discontinued        2 g 200 mL/hr over 30 Minutes Intravenous Every 24 hours 09/26/23 1402 09/29/23 1047   09/26/23 1315  vancomycin  (VANCOREADY) IVPB 2000 mg/400 mL        2,000 mg 200 mL/hr over 120 Minutes Intravenous  Once 09/26/23 1220 09/26/23 1656   09/26/23 1230  metroNIDAZOLE  (FLAGYL ) IVPB 500 mg  Status:  Discontinued        500 mg 100 mL/hr over 60 Minutes Intravenous Every 12 hours 09/26/23 1137 09/29/23 1138   09/22/23 2200  voriconazole  (VFEND ) tablet 400 mg        400 mg Oral Every 12 hours 09/22/23 1521 10/21/23 2359   09/11/23 2200  acyclovir  (ZOVIRAX ) 200 MG capsule 400 mg       Note to Pharmacy: Give after dialysis the pm dose, on dialysis days   400 mg Oral 2 times daily 09/11/23 1551     09/11/23 2200   voriconazole  (VFEND ) tablet 350 mg  Status:  Discontinued        350 mg Oral Every 12 hours 09/11/23 1551 09/22/23 1521   09/07/23 1000  acyclovir  (ZOVIRAX ) 200 MG capsule 400 mg  Status:  Discontinued       Note to Pharmacy: Give after dialysis the pm dose, on dialysis days   400 mg Per Tube 2 times daily 09/07/23 0825 09/11/23 1551   09/03/23 1200  vancomycin  (VANCOCIN ) IVPB 1000 mg/200 mL premix  Status:  Discontinued        1,000 mg 200 mL/hr over 60 Minutes Intravenous Every M-W-F (Hemodialysis) 09/02/23 1021 09/03/23 0942   09/03/23 0942  vancomycin  variable dose per unstable renal function (pharmacist dosing)  Status:  Discontinued         Does not apply See admin instructions 09/03/23 0942 09/03/23 0945   09/02/23 0915  vancomycin  (VANCOCIN ) IVPB 1000 mg/200 mL premix        1,000 mg 200 mL/hr over 60 Minutes Intravenous  Once 09/02/23 0830 09/02/23 1215   09/01/23 0834  ceFAZolin  (ANCEF ) IVPB 2g/100 mL premix        over 30 Minutes Intravenous Continuous PRN 09/01/23 0834 09/01/23 0834   08/29/23 0914  vancomycin  variable dose per unstable renal function (pharmacist dosing)  Status:  Discontinued         Does not apply See admin instructions 08/29/23 0914 09/02/23 1021   08/27/23 1400  vancomycin  (VANCOCIN ) IVPB 1000 mg/200 mL premix        1,000 mg 200 mL/hr over 60 Minutes Intravenous  Once 08/27/23  0920 08/27/23 1653   08/25/23 1200  vancomycin  (VANCOCIN ) IVPB 1000 mg/200 mL premix        1,000 mg 200 mL/hr over 60 Minutes Intravenous Every M-W-F (Hemodialysis) 08/24/23 1405 08/29/23 1240   08/24/23 1445  vancomycin  (VANCOREADY) IVPB 2000 mg/400 mL        2,000 mg 200 mL/hr over 120 Minutes Intravenous  Once 08/24/23 1353 08/25/23 0323   08/14/23 0600  ceFAZolin  (ANCEF ) IVPB 2g/100 mL premix        2 g 200 mL/hr over 30 Minutes Intravenous To Radiology 08/13/23 1055 08/14/23 0606   08/09/23 1000  acyclovir  (ZOVIRAX ) 200 MG capsule 200 mg  Status:  Discontinued       Note to  Pharmacy: Give after dialysis the pm dose, on dialysis days   200 mg Per Tube 2 times daily 08/08/23 2358 09/07/23 0825   08/09/23 0100  acyclovir  (ZOVIRAX ) 200 MG capsule 200 mg       Note to Pharmacy: Give after dialysis the pm dose, on dialysis days   200 mg Per Tube  Once 08/09/23 0009 08/09/23 0013   08/08/23 2345  acyclovir  (ZOVIRAX ) 200 MG capsule 200 mg  Status:  Discontinued       Note to Pharmacy: Give after dialysis the pm dose, on dialysis days   200 mg Oral 2 times daily 08/08/23 2257 08/08/23 2358   08/07/23 1530  ceFAZolin  (ANCEF ) IVPB 2g/100 mL premix        over 30 Minutes Intravenous Continuous PRN 08/07/23 1555 08/07/23 1530   08/05/23 2200  voriconazole  (VFEND ) tablet 350 mg  Status:  Discontinued        350 mg Per Tube Every 12 hours 08/05/23 1511 09/11/23 1551   08/01/23 1200  vancomycin  (VANCOCIN ) IVPB 1000 mg/200 mL premix        1,000 mg 200 mL/hr over 60 Minutes Intravenous Every M-W-F (Hemodialysis) 07/31/23 0649 08/01/23 1652   07/30/23 1200  vancomycin  (VANCOCIN ) IVPB 1000 mg/200 mL premix        1,000 mg 200 mL/hr over 60 Minutes Intravenous Every M-W-F (Hemodialysis) 07/30/23 0851 07/30/23 1241   07/29/23 0900  vancomycin  (VANCOCIN ) IVPB 1000 mg/200 mL premix        1,000 mg 200 mL/hr over 60 Minutes Intravenous  Once 07/29/23 0812 07/29/23 0943   07/28/23 1204  vancomycin  variable dose per unstable renal function (pharmacist dosing)  Status:  Discontinued         Does not apply See admin instructions 07/28/23 1204 07/31/23 0649   07/28/23 1000  voriconazole  (VFEND ) 350 mg in sodium chloride  0.9 % 100 mL IVPB  Status:  Discontinued       Placed in Followed by Linked Group   4 mg/kg  88.6 kg (Adjusted) 67.5 mL/hr over 120 Minutes Intravenous Every 12 hours 07/28/23 0748 08/05/23 1511   07/27/23 1000  voriconazole  (VFEND ) 400 mg in sodium chloride  0.9 % 100 mL IVPB  Status:  Discontinued       Placed in Followed by Linked Group   4 mg/kg  100.6 kg 70  mL/hr over 120 Minutes Intravenous Every 12 hours 07/26/23 0826 07/28/23 0748   07/27/23 0945  vancomycin  (VANCOREADY) IVPB 2000 mg/400 mL        2,000 mg 200 mL/hr over 120 Minutes Intravenous  Once 07/27/23 0849 07/27/23 1237   07/26/23 1515  Ampicillin -Sulbactam (UNASYN ) 3 g in sodium chloride  0.9 % 100 mL IVPB  Status:  Discontinued  3 g 200 mL/hr over 30 Minutes Intravenous Every 8 hours 07/26/23 1418 07/27/23 0830   07/26/23 0915  voriconazole  (VFEND ) 600 mg in sodium chloride  0.9 % 150 mL IVPB       Placed in Followed by Linked Group   6 mg/kg  100.6 kg 105 mL/hr over 120 Minutes Intravenous Every 12 hours 07/26/23 0826 07/27/23 0048   07/26/23 0900  Ampicillin -Sulbactam (UNASYN ) 3 g in sodium chloride  0.9 % 100 mL IVPB  Status:  Discontinued        3 g 200 mL/hr over 30 Minutes Intravenous Every 12 hours 07/26/23 0826 07/26/23 1418   07/23/23 1000  levofloxacin  (LEVAQUIN ) IVPB 500 mg  Status:  Discontinued       Placed in Followed by Linked Group   500 mg 100 mL/hr over 60 Minutes Intravenous Every 24 hours 07/22/23 1213 07/23/23 1001   07/23/23 1000  levofloxacin  (LEVAQUIN ) IVPB 500 mg  Status:  Discontinued       Placed in Followed by Linked Group   500 mg 100 mL/hr over 60 Minutes Intravenous Every 24 hours 07/23/23 1001 07/28/23 0804   07/22/23 1300  levofloxacin  (LEVAQUIN ) IVPB 750 mg       Placed in Followed by Linked Group   750 mg 100 mL/hr over 90 Minutes Intravenous  Once 07/22/23 1213 07/22/23 1542   07/21/23 1600  voriconazole  (VFEND ) 300 mg in sodium chloride  0.9 % 100 mL IVPB  Status:  Discontinued        300 mg 65 mL/hr over 120 Minutes Intravenous Every 12 hours 07/21/23 1430 07/22/23 1415   07/21/23 1000  voriconazole  (VFEND ) tablet 200 mg  Status:  Discontinued        200 mg Oral Every 12 hours 07/20/23 0944 07/21/23 1015   07/20/23 1030  voriconazole  (VFEND ) 530 mg in sodium chloride  0.9 % 150 mL IVPB        6 mg/kg  88.6 kg (Adjusted) 101.5  mL/hr over 120 Minutes Intravenous Every 12 hours 07/20/23 0944 07/21/23 0030   07/19/23 1400  meropenem  (MERREM ) 1 g in sodium chloride  0.9 % 100 mL IVPB  Status:  Discontinued        1 g 200 mL/hr over 30 Minutes Intravenous Every 8 hours 07/19/23 1143 07/22/23 1213   07/18/23 0600  meropenem  (MERREM ) 1 g in sodium chloride  0.9 % 100 mL IVPB  Status:  Discontinued        1 g 200 mL/hr over 30 Minutes Intravenous Every 24 hours 07/17/23 0916 07/19/23 1143   07/17/23 0700  meropenem  (MERREM ) 1 g in sodium chloride  0.9 % 100 mL IVPB  Status:  Discontinued        1 g 200 mL/hr over 30 Minutes Intravenous Every 12 hours 07/17/23 0601 07/17/23 0916   07/15/23 1300  vancomycin  (VANCOCIN ) IVPB 1000 mg/200 mL premix  Status:  Discontinued        1,000 mg 200 mL/hr over 60 Minutes Intravenous Every 24 hours 07/14/23 1129 07/22/23 1319   07/14/23 1215  vancomycin  (VANCOREADY) IVPB 1750 mg/350 mL        1,750 mg 175 mL/hr over 120 Minutes Intravenous  Once 07/14/23 1129 07/14/23 1449   07/09/23 1800  piperacillin -tazobactam (ZOSYN ) IVPB 3.375 g  Status:  Discontinued        3.375 g 12.5 mL/hr over 240 Minutes Intravenous Every 12 hours 07/09/23 0756 07/09/23 0844   07/09/23 1400  piperacillin -tazobactam (ZOSYN ) IVPB 3.375 g  Status:  Discontinued  3.375 g 12.5 mL/hr over 240 Minutes Intravenous Every 8 hours 07/09/23 0844 07/17/23 0557   07/08/23 1300  piperacillin -tazobactam (ZOSYN ) IVPB 3.375 g  Status:  Discontinued        3.375 g 12.5 mL/hr over 240 Minutes Intravenous Every 8 hours 07/08/23 1129 07/09/23 0756   07/05/23 2200  azithromycin  (ZITHROMAX ) 500 mg in sodium chloride  0.9 % 250 mL IVPB        500 mg 250 mL/hr over 60 Minutes Intravenous Daily at bedtime 07/05/23 2022 07/06/23 2302   07/05/23 2115  azithromycin  (ZITHROMAX ) 250 mg in dextrose  5 % 125 mL IVPB  Status:  Discontinued       Note to Pharmacy: Missed dose this morning   250 mg 127.5 mL/hr over 60 Minutes Intravenous  Every 24 hours 07/05/23 2016 07/05/23 2021   07/04/23 0900  fluconazole  (DIFLUCAN ) IVPB 200 mg  Status:  Discontinued        200 mg 100 mL/hr over 60 Minutes Intravenous Every 48 hours 07/04/23 0722 07/09/23 0809   07/03/23 0600  cefTRIAXone  (ROCEPHIN ) 1 g in sodium chloride  0.9 % 100 mL IVPB  Status:  Discontinued        1 g 200 mL/hr over 30 Minutes Intravenous Every 24 hours 07/03/23 0508 07/08/23 1104   07/03/23 0600  azithromycin  (ZITHROMAX ) tablet 250 mg  Status:  Discontinued        250 mg Oral Daily 07/03/23 0508 07/05/23 2016        I have personally reviewed the following labs and images: CBC: Recent Labs  Lab 10/03/23 0719 10/04/23 0328 10/05/23 0533 10/06/23 0614 10/07/23 0638 10/08/23 0530 10/09/23 0443  WBC 3.8* 4.8 4.4 5.2 5.1 6.0 5.3  NEUTROABS 1.8 2.2 2.0 2.9  --   --   --   HGB 8.9* 8.7* 8.9* 9.4* 9.6* 9.5* 9.5*  HCT 27.1* 26.9* 27.4* 29.2* 29.1* 28.9* 29.3*  MCV 105.4* 107.6* 107.5* 107.7* 107.4* 106.6* 106.9*  PLT 363 421* 432* 441* 464* 481* 430*   BMP &GFR Recent Labs  Lab 10/03/23 0719 10/04/23 0328 10/05/23 0533 10/06/23 0614 10/07/23 0638  NA 133* 133* 133* 134* 132*  K 3.9 4.3 4.7 5.3* 4.6  CL 99 102 99 100 101  CO2 23 24 24 24 22   GLUCOSE 91 93 85 94 87  BUN 32* 30* 27* 22* 23*  CREATININE 0.82 1.01 1.03 1.01 1.24  CALCIUM  8.6* 8.5* 8.4* 8.6* 8.6*  MG  --   --   --   --  1.8   Estimated Creatinine Clearance: 81.8 mL/min (by C-G formula based on SCr of 1.24 mg/dL). Liver & Pancreas: Recent Labs  Lab 10/03/23 0719 10/04/23 0328 10/05/23 0533 10/06/23 9385 10/07/23 0638  AST 9* 9* 8* 10* 9*  ALT 7 6 6 7 5   ALKPHOS 77 78 84 89 83  BILITOT 0.5 0.6 0.2 0.4 0.5  PROT 5.3* 5.2* 5.3* 5.5* 5.5*  ALBUMIN  2.0* 2.0* 2.0* 2.2* 2.1*   No results for input(s): LIPASE, AMYLASE in the last 168 hours. No results for input(s): AMMONIA in the last 168 hours. Diabetic: No results for input(s): HGBA1C in the last 72 hours. Recent Labs   Lab 10/08/23 1204 10/08/23 1628 10/08/23 2150 10/09/23 0811 10/09/23 1209  GLUCAP 146* 131* 137* 98 225*   Cardiac Enzymes: No results for input(s): CKTOTAL, CKMB, CKMBINDEX, TROPONINI in the last 168 hours. No results for input(s): PROBNP in the last 8760 hours. Coagulation Profile: No results for input(s): INR, PROTIME in  the last 168 hours. Thyroid Function Tests: No results for input(s): TSH, T4TOTAL, FREET4, T3FREE, THYROIDAB in the last 72 hours. Lipid Profile: No results for input(s): CHOL, HDL, LDLCALC, TRIG, CHOLHDL, LDLDIRECT in the last 72 hours. Anemia Panel: No results for input(s): VITAMINB12, FOLATE, FERRITIN, TIBC, IRON, RETICCTPCT in the last 72 hours. Urine analysis:    Component Value Date/Time   COLORURINE AMBER (A) 08/23/2023 0618   APPEARANCEUR CLOUDY (A) 08/23/2023 0618   LABSPEC 1.017 08/23/2023 0618   PHURINE 5.0 08/23/2023 0618   GLUCOSEU NEGATIVE 08/23/2023 0618   HGBUR NEGATIVE 08/23/2023 0618   BILIRUBINUR NEGATIVE 08/23/2023 0618   KETONESUR NEGATIVE 08/23/2023 0618   PROTEINUR 30 (A) 08/23/2023 0618   NITRITE NEGATIVE 08/23/2023 0618   LEUKOCYTESUR LARGE (A) 08/23/2023 0618   Sepsis Labs: Invalid input(s): PROCALCITONIN, LACTICIDVEN  Microbiology: No results found for this or any previous visit (from the past 240 hours).  Radiology Studies: No results found.    Miyeko Mahlum T. Jahlani Lorentz [facility]  If 7PM-7AM, please contact night-coverage [email or web address] 10/09/2023, 1:47 PM

## 2023-10-09 NOTE — Consult Note (Signed)
 Chief Complaint: Right hip fluid collection s/p percutaneous drain placement with Dr. Jenna 09/26/23   Referring Provider(s): Montano,Emil   Supervising Physician: Philip Cornet  Patient Status: Frank [name] - In-pt  History of Present Illness: Frank [name]. is a 58 y.o. male with past medical history significant for OSA, uses CPAP, multiple myeloma, A-fib (not on AC), hyperlipidemia, tobacco use disorder.  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 He was admitted to an outside hospital with concern for sepsis, pneumonia, encephalopathy after feeling weak for 1 week.  He developed renal failure while admitted in the ICU for 36 days.  His care required tracheostomy, HD, PEG tube placement.  Currently the patient is not requiring ventilator (room air) or hemodialysis. PEG tube has since been removed.   While admitted patient developed right leg numbness and pain.  CT of the hip revealed a large fluid collection intermuscularly (gluteus medius and rectus femoris).  Ortho was consulted and recommended IR aspiration and drain placement which was approved and performed by Dr. Jenna on 7/11 (10Fr ).   Drain output has been on average 40 mL /day. 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 Case discussed with Dr. Jenna who approves patient for drain evaluation under fluoro with possible instillation of sclerosing agent.   Patient agrees with plan to be NPO MN prior to procedure in the event that sedation is needed for unexpected drain manipulation or reposition.   Does not wear CPAP or use supplemental home O2.  Denies fever, chills, SOB, CP, sore throat, N/V, abd pain, blood in stool or urine, abnormal bruising. Leg swelling unchanged from last encounter. Endorses L arm pian, BLE sensation changes (chronic), R  leg pain.   Patient is sitting up in bed, eating breakfast during exam, pleasant, interested in his plan of care.  Allergies Reviewed:  Morphine  and codeine, Shellfish allergy, Betadine  [povidone iodine ], Bupropion, Chlorhexidine , Influenza vaccines, and Metoprolol    Patient is Full Code  Past Medical History:  Diagnosis Date   Anxiety    Arthritis    Asthma    Bipolar disorder (HCC)    Current every day smoker    Depression    Diabetes mellitus, type II (HCC)    Dyspnea    History of kidney stones    History of pneumonia    Hyperlipidemia    Hypertension    Morbid obesity (HCC)    Sedentary lifestyle    Seizures (HCC)    last seizure 12 yrs ago, no current problem   Sleep apnea    uses CPAP nightly    Past Surgical History:  Procedure Laterality Date   CARDIAC CATHETERIZATION  2020   carpal tunel Left    COLONOSCOPY     HARDWARE REMOVAL Left 07/26/2022   Procedure: HARDWARE REMOVAL ELBOW;  Surgeon: [name], MD;  Location: [facility] OR;  Service: Orthopedics;  Laterality: Left;   IR FLUORO GUIDE CV LINE RIGHT  08/07/2023   IR FLUORO GUIDE CV LINE RIGHT  09/01/2023   IR GASTROSTOMY TUBE MOD SED  08/14/2023   IR GASTROSTOMY TUBE REMOVAL  10/01/2023   IR REMOVAL TUN CV CATH W/O FL  08/29/2023   IR REMOVAL TUN CV CATH W/O FL  09/08/2023   IR US  GUIDE VASC ACCESS RIGHT  08/07/2023   IR US  GUIDE VASC ACCESS RIGHT  09/01/2023  ORIF HUMERUS FRACTURE Left 01/16/2022   Procedure: OPEN REDUCTION INTERNAL FIXATION (ORIF) DISTAL HUMERUS FRACTURE;  Surgeon: [name], MD;  Location: [facility] OR;  Service: Orthopedics;  Laterality: Left;   ORIF HUMERUS FRACTURE Left 01/29/2023   Procedure: OPEN REDUCTION INTERNAL FIXATION (ORIF) DISTAL HUMERUS FRACTURE;  Surgeon: [name], MD;  Location: [facility] OR;  Service: Orthopedics;  Laterality: Left;   OTHER SURGICAL HISTORY     R & L shoulder   OTHER SURGICAL HISTORY Right    Knee surgery x several   TOTAL HIP ARTHROPLASTY Left 12/26/2017    Procedure: LEFT TOTAL HIP ARTHROPLASTY ANTERIOR APPROACH;  Surgeon: Vernetta Lonni GRADE, MD;  Location: [facility];  Service: Orthopedics;  Laterality: Left;   TOTAL KNEE ARTHROPLASTY Left 11/23/2021   Procedure: LEFT TOTAL KNEE ARTHROPLASTY;  Surgeon: Vernetta Lonni GRADE, MD;  Location: [facility];  Service: Orthopedics;  Laterality: Left;      Medications: Prior to Admission medications   Medication Sig Start Date End Date Taking? Authorizing Provider  albuterol  (PROVENTIL  HFA;VENTOLIN  HFA) 108 (90 Base) MCG/ACT inhaler Inhale 2 puffs into the lungs every 6 (six) hours as needed for wheezing or shortness of breath.   Yes [provider]  albuterol  (PROVENTIL ) (2.5 MG/3ML) 0.083% nebulizer solution 2.5 mg every 2 (two) hours as needed for wheezing or shortness of breath.   Yes [provider]  aspirin  EC 81 MG tablet Take 81 mg by mouth daily. Swallow whole.   Yes [provider]  busPIRone  (BUSPAR ) 15 MG tablet Take 15 mg by mouth at bedtime.   Yes [provider]  celecoxib  (CELEBREX ) 200 MG capsule Take 200 mg by mouth daily.   Yes [provider]  clonazePAM  (KLONOPIN ) 1 MG tablet Take 1 mg by mouth once as needed for anxiety.   Yes [provider]  cyclobenzaprine  (FLEXERIL ) 5 MG tablet Take 1 tablet (5 mg total) by mouth 3 (three) times daily as needed for muscle spasms. Patient taking differently: Take 5 mg by mouth daily as needed for muscle spasms. 01/30/23  Yes Danton Lauraine LABOR, PA-C  divalproex  (DEPAKOTE ) 500 MG DR tablet Take 500 mg by mouth 2 (two) times daily.   Yes [provider]  EPINEPHrine  0.3 mg/0.3 mL IJ SOAJ injection Inject 0.3 mg into the muscle as needed for anaphylaxis. 08/28/21  Yes [provider]  esomeprazole (NEXIUM) 40 MG capsule Take 40 mg by mouth in the morning.   Yes [provider]  Fluticasone -Salmeterol (ADVAIR) 500-50 MCG/DOSE AEPB Inhale 2 puffs into the lungs in the  morning.   Yes [provider]  furosemide  (LASIX ) 40 MG tablet Take 40 mg by mouth daily as needed for fluid or edema.   Yes [provider]  gabapentin  (NEURONTIN ) 300 MG capsule Take 300-600 mg by mouth See admin instructions. 300 mg in the morning, 600 mg at bedtime   Yes [provider]  hydrALAZINE  (APRESOLINE ) 10 MG tablet Take 10 mg by mouth in the morning and at bedtime.   Yes [provider]  hydrochlorothiazide  (HYDRODIURIL ) 25 MG tablet Take 25 mg by mouth daily.   Yes [provider]  lisinopril  (ZESTRIL ) 40 MG tablet Take 40 mg by mouth daily. 03/25/23  Yes [provider]  metFORMIN  (GLUCOPHAGE ) 1000 MG tablet Take 1,000 mg by mouth 2 (two) times daily with a meal.   Yes [provider]  montelukast  (SINGULAIR ) 10 MG tablet Take 10 mg by mouth at bedtime.  Yes [provider]  OVER THE COUNTER MEDICATION Take 400 mg by mouth at bedtime. Burdock root   Yes [provider]  oxyCODONE  (ROXICODONE ) 5 MG immediate release tablet Take 1 tablet (5 mg total) by mouth every 4 (four) hours as needed for severe pain (pain score 7-10). 01/30/23  Yes McClung, Sarah A, PA-C  PARoxetine  (PAXIL ) 40 MG tablet Take 40 mg by mouth every morning.   Yes [provider]  spironolactone  (ALDACTONE ) 25 MG tablet Take 25 mg by mouth at bedtime. 10/07/21  Yes [provider]  tamsulosin  (FLOMAX ) 0.4 MG CAPS capsule Take 0.4 mg by mouth at bedtime. 10/07/21  Yes [provider]  testosterone  cypionate (DEPOTESTOSTERONE CYPIONATE) 200 MG/ML injection Inject 0.5 mLs (100 mg total) into the muscle every 14 (fourteen) days. 09/17/16  Yes Nida, Gebreselassie W, MD  verapamil  (CALAN -SR) 120 MG CR tablet Take 120 mg by mouth daily.   Yes [provider]  Vitamin D , Ergocalciferol , (DRISDOL ) 1.25 MG (50000 UNIT) CAPS capsule Take 1 capsule (50,000 Units total) by mouth every Thursday. Patient taking  differently: Take 50,000 Units by mouth once a week. Tuesday 02/06/23  Yes Danton Lauraine LABOR, PA-C  buPROPion (WELLBUTRIN XL) 150 MG 24 hr tablet Take 150 mg by mouth daily. Patient not taking: Reported on 07/04/2023    [provider]     Family History  Problem Relation Age of Onset   Diabetes Mother    Hypertension Mother    Heart attack Mother    Osteoporosis Mother    Diabetes Father    Hypertension Father    Heart attack Father    Diabetes Sister    Hypertension Sister    Diabetes Brother    Hypertension Brother     Social History   Socioeconomic History   Marital status: Married    Spouse name: Not on file   Number of children: Not on file   Years of education: Not on file   Highest education level: Not on file  Occupational History   Not on file  Tobacco Use   Smoking status: Every Day    Current packs/day: 0.50    Average packs/day: 0.5 packs/day for 25.0 years (12.5 ttl pk-yrs)    Types: Cigarettes   Smokeless tobacco: Never  Vaping Use   Vaping status: Never Used  Substance and Sexual Activity   Alcohol  use: No   Drug use: Yes    Types: Marijuana    Comment: 01/28/2023   Sexual activity: Yes  Other Topics Concern   Not on file  Social History Narrative   Not on file   Social Drivers of Health   Financial Resource Strain: Not on file  Food Insecurity: No Food Insecurity (07/05/2023)   Hunger Vital Sign    Worried About Running Out of Food in the Last Year: Never true    Ran Out of Food in the Last Year: Never true  Transportation Needs: No Transportation Needs (07/05/2023)   PRAPARE - Administrator, Civil Service (Medical): No    Lack of Transportation (Non-Medical): No  Physical Activity: Not on file  Stress: Not on file  Social Connections: Not on file     Review of Systems: A 12 point ROS discussed and pertinent positives are indicated in the HPI above.  All other systems are negative.   Vital Signs: BP (!) 146/104    Pulse 95   Temp 98.4 F (36.9 C) (Oral)   Resp 18  Ht 6' (1.829 m)   Wt 240 lb 4.8 oz (109 kg)   SpO2 97%   BMI 32.59 kg/m     Physical Exam Constitutional:      Appearance: He is obese.  HENT:     Mouth/Throat:     Mouth: Mucous membranes are moist.     Pharynx: Oropharynx is clear.  Cardiovascular:     Rate and Rhythm: Normal rate and regular rhythm.     Pulses: Normal pulses.     Heart sounds: Normal heart sounds.  Pulmonary:     Effort: Pulmonary effort is normal.     Breath sounds: Normal breath sounds.  Musculoskeletal:        General: Swelling present.     Comments: RLE swelling, R hip drain present, scant brown fluid in collection (pt states RN recently emptied it), appropriated charged, mild erythema around insertion without leakarge at insertion site. Dressing C/D/I  Skin:    General: Skin is warm and dry.  Neurological:     Mental Status: He is alert and oriented to person, place, and time.  Psychiatric:        Mood and Affect: Mood normal.        Behavior: Behavior normal.        Thought Content: Thought content normal.        Judgment: Judgment normal.     Imaging: CT PELVIS WO CONTRAST Result Date: 10/07/2023 CLINICAL DATA:  Right hip drain evaluation. EXAM: CT PELVIS WITHOUT CONTRAST TECHNIQUE: Multidetector CT imaging of the pelvis was performed following the standard protocol without intravenous contrast. RADIATION DOSE REDUCTION: This exam was performed according to the departmental dose-optimization program which includes automated exposure control, adjustment of the mA and/or kV according to patient size and/or use of iterative reconstruction technique. COMPARISON:  CT abdomen pelvis dated 07/08/2023. FINDINGS: Urinary Tract: The visualized ureters appear unremarkable. Punctate stone noted in the posterior bladder lumen. The urinary bladder is otherwise unremarkable. Bowel:  Moderate stool in the colon.  The appendix is normal. Vascular/Lymphatic:  Moderate aortoiliac atherosclerotic disease. No pelvic adenopathy. Reproductive: The prostate and seminal vesicles are grossly unremarkable. Other: Right gluteal pigtail drainage catheter. Significant interval decrease in the size of the hematoma in the right gluteal muscle now measuring approximately 4.7 x 9.3 cm. Additional loculated collection in the anterior upper right thigh significantly decreased in size since the prior CT and measures approximately 10 x 17 cm in greatest axial dimensions. Musculoskeletal: Osteopenia with degenerative changes of the spine. Total left hip arthroplasty. Scattered lucent lesions throughout the bones as seen on the prior CT. IMPRESSION: 1. Right gluteal pigtail drainage catheter with significant interval decrease in the size of the hematoma in the right gluteal muscle. 2. Significant interval decrease in the size of the loculated collection in the anterior upper right thigh. 3. Punctate stone in the posterior bladder lumen. 4.  Aortic Atherosclerosis (ICD10-I70.0). Electronically Signed   By: Vanetta Chou M.D.   On: 10/07/2023 16:53   DG CHEST PORT 1 VIEW Result Date: 10/06/2023 EXAM: 1 VIEW XRAY OF THE CHEST 10/06/2023 08:52:00 PM COMPARISON: 09/28/2023 CLINICAL HISTORY: 141880 SOB (shortness of breath) 141880. sob FINDINGS: LUNGS AND PLEURA: Increased interstitial markings, left greater than right, favoring atypical infection / pneumonia over interstitial edema. No pleural effusion. No pneumothorax. HEART AND MEDIASTINUM: No acute abnormality of the cardiac and mediastinal silhouettes. BONES AND SOFT TISSUES: Degenerative changes of the bilateral shoulders. No acute osseous abnormality. IMPRESSION: 1. Increased interstitial markings, left greater  than right, favoring atypical infection/pneumonia over interstitial edema. Electronically signed by: Pinkie Pebbles MD 10/06/2023 08:58 PM EDT RP Workstation: [id number]   CT FEMUR RIGHT WO CONTRAST Result Date:  10/03/2023 CLINICAL DATA:  Right thigh fluid collection, drain maintenance EXAM: CT OF THE LOWER RIGHT EXTREMITY WITHOUT CONTRAST TECHNIQUE: Multidetector CT imaging of the right lower extremity was performed according to the standard protocol. RADIATION DOSE REDUCTION: This exam was performed according to the departmental dose-optimization program which includes automated exposure control, adjustment of the mA and/or kV according to patient size and/or use of iterative reconstruction technique. COMPARISON:  09/24/2023 FINDINGS: Bones/Joint/Cartilage Stable scattered bone lucencies, see description from the 09/24/2023 exam. Stable right hip arthropathy. Prominent osteoarthritis of the right knee. Left total knee prosthesis partially observed. Large spur like projections from the inferior patella extending into the joint. Moderate knee effusion possibly with synovitis. Suspected free osteochondral fragments in the knee joint. Ligaments N/A Muscles and Tendons Semimembranosus muscular atrophy bilaterally. Soft tissues Reduced size of the fluid collection along the superficial fascia margin of the tensor fascia lata and rectus femoris, currently measuring about 20.9 by 3.0 by 11.8 cm (volume = 390 cm^3). Previous total volume cannot be calculated as this was not completely included, but previous total volume was probably about 3 times is much. Surrounding subcutaneous edema along the right lateral thigh tracking down to the knee. There is also some subcutaneous edema medially in both thighs. Edema tracks along the superficial fascia margin of the posterior calf musculature in the upper calf region. Prominence of stool in visualized colon, cannot exclude constipation. Atherosclerosis noted. IMPRESSION: 1. Reduced size of the fluid collection along the superficial fascia margin of the tensor fascia lata and rectus femoris, currently measuring about 20.9 by 3.0 by 11.8 cm (volume = 390 cc). Previous total volume cannot be  calculated as this was not completely included, but previous total volume was probably about 3 times is much. 2. Surrounding subcutaneous edema along the right lateral thigh tracking down to the knee. There is also some subcutaneous edema medially in both thighs. Edema tracks along the superficial fascia margin of the posterior calf musculature in the upper calf region. 3. Prominent osteoarthritis of the right knee. Moderate knee effusion possibly with synovitis. Suspected free osteochondral fragments in the knee joint. 4. Prominence of stool in visualized colon, cannot exclude constipation. 5. Atherosclerosis. 6. Stable scattered bone lucencies, see description from the 09/24/2023 exam. Electronically Signed   By: Ryan Salvage M.D.   On: 10/03/2023 08:21   VAS US  LOWER EXTREMITY VENOUS (DVT) Result Date: 10/01/2023  Lower Venous DVT Study Patient Name:  Maron Stanzione.  Date of Exam:   10/01/2023 Medical Rec #: [id number]           Accession #:    [id number] Date of Birth: Jun 27, 1965           Patient Gender: M Patient Age:   89 years Exam Location:  [facility] Procedure:      VAS US  LOWER EXTREMITY VENOUS (DVT) Referring Phys: DELILIAH RASHID --------------------------------------------------------------------------------  Indications: Right thigh swelling, erythema, pain.  Risk Factors: Extended hospitalization with immobility (3 months). Limitations: Poor ultrasound/tissue interface. Comparison Study: LLEV on 07/18/2023 was negative for DVT. Performing Technologist: Ezzie Potters RVT, RDMS  Examination Guidelines: A complete evaluation includes B-mode imaging, spectral Doppler, color Doppler, and power Doppler as needed of all accessible portions of each vessel. Bilateral testing is considered an integral part of a complete examination. Limited examinations  for reoccurring indications may be performed as noted. The reflux portion of the exam is performed with the patient in reverse Trendelenburg.   +---------+---------------+---------+-----------+----------+-------------------+ RIGHT    CompressibilityPhasicitySpontaneityPropertiesThrombus Aging      +---------+---------------+---------+-----------+----------+-------------------+ CFV      Full           Yes      Yes                                      +---------+---------------+---------+-----------+----------+-------------------+ SFJ      Full                                                             +---------+---------------+---------+-----------+----------+-------------------+ FV Prox  Full           Yes      Yes                                      +---------+---------------+---------+-----------+----------+-------------------+ FV Mid   Full           Yes      Yes                                      +---------+---------------+---------+-----------+----------+-------------------+ FV DistalFull           Yes      Yes                                      +---------+---------------+---------+-----------+----------+-------------------+ PFV      Full                                                             +---------+---------------+---------+-----------+----------+-------------------+ POP      Full           Yes      Yes                                      +---------+---------------+---------+-----------+----------+-------------------+ PTV      None           No       No                   Age indeterminate                                                         (one of paired)     +---------+---------------+---------+-----------+----------+-------------------+ PERO     Full                                                             +---------+---------------+---------+-----------+----------+-------------------+   +---------+---------------+---------+-----------+----------+--------------+  LEFT     CompressibilityPhasicitySpontaneityPropertiesThrombus Aging  +---------+---------------+---------+-----------+----------+--------------+ CFV      None           No       No                   Acute          +---------+---------------+---------+-----------+----------+--------------+ SFJ      Partial                                      Acute          +---------+---------------+---------+-----------+----------+--------------+ FV Prox  Full           Yes      Yes                                 +---------+---------------+---------+-----------+----------+--------------+ FV Mid   Full           Yes      Yes                                 +---------+---------------+---------+-----------+----------+--------------+ FV DistalFull           Yes      Yes                                 +---------+---------------+---------+-----------+----------+--------------+ PFV      None           No       No                                  +---------+---------------+---------+-----------+----------+--------------+ POP      Full           Yes      Yes                                 +---------+---------------+---------+-----------+----------+--------------+ PTV      Full                                                        +---------+---------------+---------+-----------+----------+--------------+ PERO     Full                                                        +---------+---------------+---------+-----------+----------+--------------+ Proximal CFV compresses, has color, and phasic flow. Mid and distal CFV have occlusive thrombus    Summary: BILATERAL: -No evidence of popliteal cyst, bilaterally. RIGHT: - Findings consistent with age indeterminate deep vein thrombosis involving the right posterior tibial veins.   LEFT: - Findings consistent with acute deep vein thrombosis involving the left common femoral vein, SF junction, and left femoral vein.  - The common femoral vein obstruction does not appear to extend above inguinal  ligament.  *See table(s) above for measurements and observations. Electronically signed by Gaile New MD on 10/01/2023 at 5:11:34 PM.    Final    IR GASTROSTOMY TUBE REMOVAL Result Date: 10/01/2023 INDICATION: Gastrostomy tube is no longer needed. EXAM: GASTROSTOMY TUBE REMOVAL MEDICATIONS: None ANESTHESIA/SEDATION: None CONTRAST:  None FLUOROSCOPY: None COMPLICATIONS: None immediate. PROCEDURE: Informed written consent was obtained from the patient after a thorough discussion of the procedural risks, benefits and alternatives. All questions were addressed. A timeout was performed prior to the initiation of the procedure. Balloon retention 18 French gastrostomy tube was intact. 5 cc syringe was used to remove 5 cc of saline from balloon. Gastrostomy tube was removed without difficulty. Site was cleaned and dressed appropriately. IMPRESSION: Successful removal of 18 French balloon retention gastrostomy tube. Performed by Carlin Griffon, PA-C Electronically Signed   By: Juliene Balder M.D.   On: 10/01/2023 16:40   US  RT LOWER EXTREM LTD SOFT TISSUE NON VASCULAR Result Date: 09/30/2023 CLINICAL DATA:  Evaluate for abscess. EXAM: ULTRASOUND RIGHT LOWER EXTREMITY LIMITED TECHNIQUE: Ultrasound examination of the lower extremity soft tissues was performed in the area of clinical concern. COMPARISON:  None Available. FINDINGS: In the region of palpable abnormality in the lateral aspect of the right thigh there is a well-circumscribed is 8.5 x 2.3 x 6.8 cm fluid collection in the subcutaneous tissues. This is 1.8 cm deep to the skin. There is no abnormal vascularity. There is no soft tissue mass. IMPRESSION: 8.5 cm fluid collection in the subcutaneous tissues of the lateral aspect of the right thigh. This may represent a seroma or abscess. Electronically Signed   By: Greig Pique M.D.   On: 09/30/2023 21:47   DG Knee 1-2 Views Right Result Date: 09/29/2023 CLINICAL DATA:  Knee pain and swelling EXAM: RIGHT KNEE - 1-2  VIEW COMPARISON:  X-ray 01/18/2019 FINDINGS: Moderate joint effusion. Moderate joint space loss of the medial compartment and patellofemoral joint. Tricompartmental osteophytes are seen greatest of the patellofemoral joint. Chondrocalcinosis also identified. There is medial translation of the femur relative to the tibia as well. Preserved bone mineralization. IMPRESSION: Advanced tricompartmental degenerative changes. Moderate joint effusion. Chondrocalcinosis Electronically Signed   By: Ranell Bring M.D.   On: 09/29/2023 15:57   DG Ribs Unilateral Left Result Date: 09/28/2023 CLINICAL DATA:  Left-sided rib pain after cough EXAM: LEFT RIBS - 2 VIEW COMPARISON:  Chest x-ray 09/28/2023.  Chest CT 07/26/2023. FINDINGS: Expansile anterolateral left 6 rib lesion is again noted. No displaced fractures are identified. Lungs are grossly clear. IMPRESSION: Expansile anterolateral left 6 rib lesion is again noted. Correlate for point tenderness. No displaced fractures are identified. Electronically Signed   By: Greig Pique M.D.   On: 09/28/2023 21:50   DG CHEST PORT 1 VIEW Result Date: 09/28/2023 CLINICAL DATA:  Chest pain.  Attention left ribs. EXAM: PORTABLE CHEST 1 VIEW COMPARISON:  08/23/2023, CT 07/26/2023 FINDINGS: Removal of right-sided dialysis catheter and tracheostomy tube. Low lung volumes persist. Ill-defined opacity at the left lung base likely pleural effusion and airspace disease/atelectasis. Stable heart size and mediastinal contours. Vascular congestion. No pneumothorax. Chronic bilateral shoulder arthropathy. Lucent lesions within the thoracic osseous structures on prior CT, including left rib, not well demonstrated by radiograph. IMPRESSION: 1. Ill-defined opacity at the left lung base likely pleural effusion and airspace disease/atelectasis. 2. Vascular congestion. 3. Lucent lesions within the thoracic osseous structures on prior CT, including left rib, are not well demonstrated by radiograph.  Electronically Signed   By: Andrea  Sanford M.D.   On: 09/28/2023 16:40   US  IMAGE GUIDED FLUID DRAIN BY CATHETER Result Date: 09/26/2023 CLINICAL DATA:  Large right gluteal fluid collection likely evolving hematoma or potential abscess EXAM: US  IMAGE GUIDED FLUID DRAIN BY CATHETER TECHNIQUE: Ultrasound guidance COMPARISON:  None Available. FINDINGS: With the patient in a supine position, the right gluteal region and hip region were evaluated with ultrasound. A large fluid collection corresponding with previous visualized fluid collection is again seen. The patient was prepped and draped in usual sterile fashion. Local anesthesia was achieved by infiltrating subcutaneous tissue with 1% lidocaine . A small incision was made and then a 7 cm Yueh needle was advanced under ultrasound guidance from the incision to midpoint of the fluid collection. The needle was removed and short Amplatz wire was advanced under ultrasound guidance and coiled within the fluid collection. The Yueh catheter was then removed and the access site was dilated with a 10 French fascial dilator. A 10 French pigtail catheter was then advanced over the guidewire and coiled within the fluid collection. Locking mechanism engaged. Retention suture and sterile dressing applied. Small sample was obtained and sent to pathology for aerobic and anaerobe evaluation. IMPRESSION: Satisfactory 10 French drainage catheter placed in a right hip abscess. Fluid appears to be infected hematoma. Electronically Signed   By: Cordella Banner   On: 09/26/2023 10:41   CT HEAD WO CONTRAST ( ) Result Date: 09/25/2023 CLINICAL DATA:  Initial evaluation for acute neuro deficit, stroke suspected. EXAM: CT HEAD WITHOUT CONTRAST TECHNIQUE: Contiguous axial images were obtained from the base of the skull through the vertex without intravenous contrast. RADIATION DOSE REDUCTION: This exam was performed according to the departmental dose-optimization program which includes  automated exposure control, adjustment of the mA and/or kV according to patient size and/or use of iterative reconstruction technique. COMPARISON:  CT from 09/22/2023. FINDINGS: Brain: Cerebral volume within normal limits. Patchy hypodensity involving the supratentorial cerebral white matter, most characteristic of chronic microvascular ischemic disease, moderate in nature. No acute intracranial hemorrhage. No acute large vessel territory infarct. No mass lesion, midline shift or mass effect. No hydrocephalus or extra-axial fluid collection. Vascular: No abnormal hyperdense vessel. Calcified atherosclerosis present at the skull base. Skull: Scalp soft tissues demonstrate no acute finding. Multiple lucent lesions again noted throughout the calvarium and visualized skull base, consistent with history of multiple myeloma. Sinuses/Orbits: Globes orbital soft tissues within normal limits. Mild mucosal thickening noted about the right maxillary sinus. Minimal pneumatized secretions noted within the right sphenoid sinus. Large right mastoid and middle ear effusion, with small left mastoid effusion. Imaged nasopharynx grossly unremarkable. Other: None. IMPRESSION: 1. No acute intracranial abnormality. 2. Moderate chronic microvascular ischemic disease. 3. Multiple lucent lesions throughout the calvarium and visualized skull base, consistent with history of multiple myeloma. 4. Large right mastoid and middle ear effusion, with small left mastoid effusion. Findings of uncertain significance, however, correlation with physical exam for possible acute otomastoiditis recommended. Electronically Signed   By: Morene Hoard M.D.   On: 09/25/2023 20:46   CT HIP RIGHT WO CONTRAST Result Date: 09/25/2023 CLINICAL DATA:  Right hip pain EXAM: CT OF THE RIGHT HIP WITHOUT CONTRAST TECHNIQUE: Multidetector CT imaging of the right hip was performed according to the standard protocol. Multiplanar CT image reconstructions were also  generated. RADIATION DOSE REDUCTION: This exam was performed according to the departmental dose-optimization program which includes automated exposure control, adjustment of the mA and/or kV according to patient size and/or use of iterative reconstruction technique.  COMPARISON:  CT right femur 08/01/2023 FINDINGS: Bones/Joint/Cartilage Moderate to prominent axial and craniocaudad chondral thinning in the right hip associated with substantial spurring of the right femoral head. Mildly aspherical femoral head morphology may predispose to cam type femoroacetabular impingement. Scattered degenerative subcortical cystic lesions along the right femoral head and acetabulum. Scattered lucencies in the marrow of the visualized bony structures including the right iliac bone, ischium, and right proximal femur. Entities such as myeloma are not excluded. Similar lesions have been reported in other bony structures example in cranial structures on the MRI head from 07/26/2023. Ligaments Suboptimally assessed by CT. Muscles and Tendons Moderate regional muscular atrophy. Soft tissues Extending partially along the superficial fascia margin of the gluteus medius and rectus femoris muscle, there is a large fluid collection which extends above and below the margins of today's CT scan. This measures 12.9 by 6.3 cm in cross-section on image 93 of series 5 and was also present on 08/01/2023, tracking cephalad on that exam all the way up to the level of the iliac crests. Appearance favors chronic hematoma or possibly a Morel-Lavalle lesion given the lack of resolution. Right iliac artery atherosclerosis. IMPRESSION: 1. 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 Mildly aspherical femoral head morphology may predispose to cam type femoroacetabular impingement. 2. Scattered lucencies in the marrow of the visualized bony structures including the right iliac bone, ischium, and right proximal femur. Entities such as myeloma are not  excluded. Similar lesions have been reported in other bony structures example in cranial structures on the MRI head from 07/26/2023. 3. Large fluid collection extending partially along the superficial fascia margin of the gluteus medius and rectus femoris muscle, also present on 08/01/2023, tracking cephalad on that exam all the way up to the level of the iliac crests. Appearance favors chronic hematoma or possibly a Morel-Lavallee lesion given the lack of resolution. 4. Moderate regional muscular atrophy. 5. Right iliac artery atherosclerosis. Electronically Signed   By: Ryan Salvage M.D.   On: 09/25/2023 08:00   CT HEAD WO CONTRAST ( ) Result Date: 09/23/2023 EXAM: CT HEAD WITHOUT CONTRAST 09/22/2023 09:56:16 PM TECHNIQUE: CT of the head was performed without the administration of intravenous contrast. Automated exposure control, iterative reconstruction, and/or weight based adjustment of the mA/kV was utilized to reduce the radiation dose to as low as reasonably achievable. COMPARISON: None available. CLINICAL HISTORY: Transient ischemic attack (TIA). FINDINGS: BRAIN AND VENTRICLES: No acute hemorrhage. Gray-white differentiation is preserved. No hydrocephalus. No extra-axial collection. No mass effect or midline shift. Periventricular and scattered subcortical white matter hypoattenuation is moderately advanced for age, stable. Mild generalized atrophy is stable. ORBITS: No acute abnormality. SINUSES: Bilateral mastoid effusions are again noted, right greater than left. Minimal right maxillary sinus disease is again seen. Secretions are present within the right sphenoid sinus. SOFT TISSUES AND SKULL: No acute soft tissue abnormality. No skull fracture. A lytic lesion is again noted within a right greater than left clivus. Multiple smaller lytic lesions are present throughout the calvarium, stable. VASCULATURE: Atherosclerotic calcifications are present in the cavernous carotid arteries bilaterally. No  hyperdense vessel is present. IMPRESSION: 1. No acute intracranial abnormality related to the clinical history of TIA. 2. Moderately advanced periventricular and scattered subcortical white matter hypoattenuation, stable. 3. Mild generalized atrophy, stable. 4. Bilateral mastoid effusions, right greater than left, and minimal right maxillary sinus disease, again seen. Secretions are present within the right sphenoid sinus. 5. Lytic lesion within a right greater than left clivus and multiple smaller  lytic lesions throughout the calvarium, stable. Electronically signed by: Lonni Necessary MD 09/23/2023 05:20 AM EDT RP Workstation: [id number]   DG Humerus Left Result Date: 09/15/2023 CLINICAL DATA:  Myeloma. EXAM: LEFT HUMERUS - 2+ VIEW COMPARISON:  09/01/2023 FINDINGS: Plate and screw fixation of distal humerus. Diffuse bony demineralization of the distal humerus with innumerable cystic lucencies, without significant interval change. Suspected pathologic fracture fixated by a hardware. No new fracture. The proximal humerus is intact. IMPRESSION: Stable radiographic appearance of the distal humerus with plate and screw fixation. Diffusely decreased bone mineralization with innumerable cystic lucencies distally, grossly unchanged from prior. Given provided history of myeloma this is likely due to diffuse myomatous change, although infection is not excluded on imaging findings alone. Electronically Signed   By: Andrea Gasman M.D.   On: 09/15/2023 15:23    Labs:  CBC: Recent Labs    10/06/23 0614 10/07/23 0638 10/08/23 0530 10/09/23 0443  WBC 5.2 5.1 6.0 5.3  HGB 9.4* 9.6* 9.5* 9.5*  HCT 29.2* 29.1* 28.9* 29.3*  PLT 441* 464* 481* 430*    COAGS: Recent Labs    07/05/23 2227 07/07/23 2030 07/10/23 0411 07/26/23 0423 08/13/23 0908 10/02/23 1456 10/02/23 2305 10/03/23 0719  INR 2.1* 1.9*  --   --  1.0  --   --   --   APTT  --  44*   < > 36  --  43* 39* 52*   < > = values in this  interval not displayed.    BMP: Recent Labs    10/04/23 0328 10/05/23 0533 10/06/23 0614 10/07/23 0638  NA 133* 133* 134* 132*  K 4.3 4.7 5.3* 4.6  CL 102 99 100 101  CO2 24 24 24 22   GLUCOSE 93 85 94 87  BUN 30* 27* 22* 23*  CALCIUM  8.5* 8.4* 8.6* 8.6*  CREATININE 1.01 1.03 1.01 1.24  GFRNONAA >60 >60 >60 >60    LIVER FUNCTION TESTS: Recent Labs    10/04/23 0328 10/05/23 0533 10/06/23 0614 10/07/23 0638  BILITOT 0.6 0.2 0.4 0.5  AST 9* 8* 10* 9*  ALT 6 6 7 5   ALKPHOS 78 84 89 83  PROT 5.2* 5.3* 5.5* 5.5*  ALBUMIN  2.0* 2.0* 2.2* 2.1*    TUMOR MARKERS: No results for input(s): AFPTM, CEA, CA199, CHROMGRNA in the last 8760 hours.  Assessment and Plan:  Image guided R hip drain evaluation under fluoro with possible instillation of sclerosing agent (doxycycline ) scheduled in AM on 7/25 with Dr. Jenna.  No contraindications for procedure identified in ROS, physical exam, or review of pre-sedation considerations. Labs reviewed and within acceptable range 7/22 CT imaging available and reviewed VSS, afebrile Doxycycline  sclerosing agent orders in place to be sent from pharmacy   Risks and benefits discussed with the patient including bleeding, infection, damage to adjacent structures, leakage of or reaction to sclerosing agent, and sepsis.  All of the patient's questions were answered, patient is agreeable to proceed. Consent signed and in chart.   Thank you for allowing our service to participate in Frank [name]. 's care.    Electronically Signed: Laymon Coast, NP   10/09/2023, 8:47 AM     I spent a total of 20 Minutes    in face to face in clinical consultation, greater than 50% of which was counseling/coordinating care for image guided drain eval under fluoro with possible instillation of sclerosing agent.    (A copy of this note was sent to the referring provider and the  time of visit.)

## 2023-10-09 NOTE — Plan of Care (Signed)
  Problem: Education: Goal: Knowledge of General Education information will improve Description: Including pain rating scale, medication(s)/side effects and non-pharmacologic comfort measures Outcome: Progressing   Problem: Health Behavior/Discharge Planning: Goal: Ability to manage health-related needs will improve Outcome: Progressing   Problem: Clinical Measurements: Goal: Ability to maintain clinical measurements within normal limits will improve Outcome: Progressing Goal: Will remain free from infection Outcome: Progressing Goal: Diagnostic test results will improve Outcome: Progressing Goal: Respiratory complications will improve Outcome: Progressing Goal: Cardiovascular complication will be avoided Outcome: Progressing   Problem: Activity: Goal: Risk for activity intolerance will decrease Outcome: Progressing   Problem: Nutrition: Goal: Adequate nutrition will be maintained Outcome: Progressing   Problem: Coping: Goal: Level of anxiety will decrease Outcome: Progressing   Problem: Elimination: Goal: Will not experience complications related to bowel motility Outcome: Progressing Goal: Will not experience complications related to urinary retention Outcome: Progressing   Problem: Pain Managment: Goal: General experience of comfort will improve and/or be controlled Outcome: Progressing   Problem: Safety: Goal: Ability to remain free from injury will improve Outcome: Progressing   Problem: Skin Integrity: Goal: Risk for impaired skin integrity will decrease Outcome: Progressing   Problem: Education: Goal: Ability to describe self-care measures that may prevent or decrease complications (Diabetes Survival Skills Education) will improve Outcome: Progressing Goal: Individualized Educational Video(s) Outcome: Progressing   Problem: Coping: Goal: Ability to adjust to condition or change in health will improve Outcome: Progressing   Problem: Fluid  Volume: Goal: Ability to maintain a balanced intake and output will improve Outcome: Progressing   Problem: Health Behavior/Discharge Planning: Goal: Ability to identify and utilize available resources and services will improve Outcome: Progressing Goal: Ability to manage health-related needs will improve Outcome: Progressing   Problem: Metabolic: Goal: Ability to maintain appropriate glucose levels will improve Outcome: Progressing   Problem: Nutritional: Goal: Maintenance of adequate nutrition will improve Outcome: Progressing Goal: Progress toward achieving an optimal weight will improve Outcome: Progressing   Problem: Skin Integrity: Goal: Risk for impaired skin integrity will decrease Outcome: Progressing   Problem: Tissue Perfusion: Goal: Adequacy of tissue perfusion will improve Outcome: Progressing   Problem: Education: Goal: Knowledge about tracheostomy care/management will improve Outcome: Progressing   Problem: Activity: Goal: Ability to tolerate increased activity will improve Outcome: Progressing   Problem: Health Behavior/Discharge Planning: Goal: Ability to manage tracheostomy will improve Outcome: Progressing   Problem: Respiratory: Goal: Patent airway maintenance will improve Outcome: Progressing   Problem: Role Relationship: Goal: Ability to communicate will improve Outcome: Progressing   Problem: Education: Goal: Knowledge of disease and its progression will improve Outcome: Progressing Goal: Individualized Educational Video(s) Outcome: Progressing   Problem: Fluid Volume: Goal: Compliance with measures to maintain balanced fluid volume will improve Outcome: Progressing   Problem: Health Behavior/Discharge Planning: Goal: Ability to manage health-related needs will improve Outcome: Progressing   Problem: Nutritional: Goal: Ability to make healthy dietary choices will improve Outcome: Progressing   Problem: Clinical  Measurements: Goal: Complications related to the disease process, condition or treatment will be avoided or minimized Outcome: Progressing

## 2023-10-09 NOTE — Progress Notes (Signed)
 Physical Therapy Treatment Patient Details Name: Frank [name]. MRN: [id number] DOB: October 26, 1965 Today's Date: 10/09/2023   History of Present Illness 58 y.o. male transferred from [facility] 07/03/23 to [facility] for management of newly diagnosed plasma cell myeloma with weakness and AMS. Pt also with AKI and metabolic encephalopathy. 5/10 transfer to [facility] for iHD. 4/22 Intubated and chemo initiated. 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 5/6 IVIG started. 5/8-6/11 trach. 5/12 iHD initiated. 5/29 PEG placed. 6/5 chemo initiated. PMHx:Lt THA, carpal tunnel syndrome, Lt TKA, Lt humerus fx, PAF, HTN, HLD, bipolar disorder, obesity, T2DM    PT Comments  Pt has started showing good progress toward goals, still limited by significant weakness and no functional use of the L UE.  Emphasis on rolling and pt assisted pulling up shorts, max assisted transition to sitting with +2 assist, scooting and prepping for sliding board transfer, 4 STS with 2 person mod, 1 in the STEDY to transfer back to bed.     If plan is discharge home, recommend the following: Two people to help with walking and/or transfers;Assistance with cooking/housework;Assist for transportation;Two people to help with bathing/dressing/bathroom;Direct supervision/assist for medications management;Direct supervision/assist for financial management;Help with stairs or ramp for entrance;Assistance with feeding   Can travel by private vehicle     No  Equipment Recommendations  Hospital bed;Hoyer lift;Wheelchair (measurements PT);Wheelchair cushion (measurements PT);BSC/3in1    Recommendations for Other Services       Precautions / Restrictions Precautions Precautions: Fall Recall of Precautions/Restrictions: Impaired Precaution/Restrictions Comments: plan for amputation, Dr Kendal okay with arm in sling Required Braces or Orthoses: Sling Restrictions Weight Bearing Restrictions Per Provider Order: Yes LUE Weight Bearing Per Provider Order: Non  weight bearing Other Position/Activity Restrictions: Lt humerus non-union, splinted. no elbow ROM, but shoulder ROM ok per secure chat with Dr. Jonel 7/1     Mobility  Bed Mobility Overal bed mobility: Needs Assistance Bed Mobility: Rolling, Supine to Sit, Sit to Supine Rolling: Max assist, Used rails   Supine to sit: Mod assist, HOB elevated, Used rails Sit to supine: +2 for physical assistance, Total assist   General bed mobility comments: patient required assistance with raising trunk    Transfers Overall transfer level: Needs assistance Equipment used: Ambulation equipment used, Sliding board Transfers: Sit to/from Stand, Bed to chair/wheelchair/BSC Sit to Stand: +2 physical assistance, +2 safety/equipment, Mod assist          Lateral/Scoot Transfers: +2 physical assistance, Min assist General transfer comment: Slide board transfer bed>wheelchair with slide board, followed by 3 sit>stand transfers with bed rail for support. At end of session Stedy utilized for back to bed. Transfer via Lift Equipment: Stedy  Ambulation/Gait                   Stairs             Wheelchair Mobility     Tilt Bed    Modified Rankin (Stroke Patients Only)       Balance Overall balance assessment: Needs assistance Sitting-balance support: Single extremity supported, Feet supported Sitting balance-Leahy Scale: Fair Sitting balance - Comments: EOB Postural control: Right lateral lean Standing balance support: Single extremity supported, During functional activity, Reliant on assistive device for balance Standing balance-Leahy Scale: Zero Standing balance comment: reliant on LUE support and assistance with [facility] and therapist                            Communication  Communication Communication: Impaired Factors Affecting Communication: Reduced clarity of speech  Cognition Arousal: Alert Behavior During Therapy: WFL for tasks assessed/performed                              Following commands: Impaired Following commands impaired: Follows one step commands with increased time    Cueing Cueing Techniques: Verbal cues, Tactile cues, Visual cues  Exercises      General Comments General comments (skin integrity, edema, etc.): vss on RA      Pertinent Vitals/Pain Pain Assessment Pain Assessment: Faces Faces Pain Scale: Hurts even more Pain Location: left shoulder and Right knee when WBing Pain Descriptors / Indicators: Discomfort, Grimacing, Sharp, Moaning Pain Intervention(s): Limited activity within patient's tolerance, Monitored during session    Home Living                          Prior Function            PT Goals (current goals can now be found in the care plan section) Acute Rehab PT Goals PT Goal Formulation: With patient Time For Goal Achievement: 10/23/23 Potential to Achieve Goals: Fair Progress towards PT goals: Progressing toward goals    Frequency    Min 5X/week      PT Plan      Co-evaluation   Reason for Co-Treatment: For patient/therapist safety;To address functional/ADL transfers PT goals addressed during session: Mobility/safety with mobility;Balance OT goals addressed during session: ADL's and self-care      AM-PAC PT 6 Clicks Mobility   Outcome Measure  Help needed turning from your back to your side while in a flat bed without using bedrails?: A Lot Help needed moving from lying on your back to sitting on the side of a flat bed without using bedrails?: Total Help needed moving to and from a bed to a chair (including a wheelchair)?: Total Help needed standing up from a chair using your arms (e.g., wheelchair or bedside chair)?: Total Help needed to walk in hospital room?: Total Help needed climbing 3-5 steps with a railing? : Total 6 Click Score: 7    End of Session Equipment Utilized During Treatment: Gait belt Activity Tolerance: Patient tolerated treatment  well;Patient limited by fatigue Patient left: in bed;with call bell/phone within reach Nurse Communication: Mobility status PT Visit Diagnosis: Other abnormalities of gait and mobility (R26.89);Muscle weakness (generalized) (M62.81);Difficulty in walking, not elsewhere classified (R26.2)     Time: 1332-1400 PT Time Calculation (min) (ACUTE ONLY): 28 min  Charges:    $Therapeutic Activity: 8-22 mins PT General Charges $$ ACUTE PT VISIT: 1 Visit                     10/09/2023  India HERO., PT Acute Rehabilitation Services [phone]  (office)   Vinie GAILS Malaky Tetrault 10/09/2023, 3:24 PM

## 2023-10-09 NOTE — TOC Progression Note (Signed)
 Transition of Care (TOC) - Progression Note    Patient Details  Name: Frank [name]. MRN: [id number] Date of Birth: 04-26-1965  Transition of Care [facility]) CM/SW Contact  Lauraine FORBES Saa, LCSW Phone Number: 10/09/2023, 12:14 PM  Clinical Narrative:     12:14 PM Per chart review and progressions, patient is expected to receive left arm amputation Monday. CSW will submit new FLS post-op with updated PT/OT recommendations. CSW will continue to follow and be available to assist.  Expected Discharge Plan: Skilled Nursing Facility Barriers to Discharge: Continued Medical Work up               Expected Discharge Plan and Services In-house Referral: Clinical Social Work Discharge Planning Services: CM Consult Post Acute Care Choice: Skilled Nursing Facility Living arrangements for the past 2 months: Single Family Home                 DME Arranged: N/A DME Agency: NA       HH Arranged: NA HH Agency: NA         Social Drivers of Health (SDOH) Interventions SDOH Screenings   Food Insecurity: No Food Insecurity (07/05/2023)  Housing: Low Risk  (07/05/2023)  Transportation Needs: No Transportation Needs (07/05/2023)  Utilities: Not At Risk (07/05/2023)  Tobacco Use: High Risk (08/30/2023)    Readmission Risk Interventions     No data to display

## 2023-10-09 NOTE — Progress Notes (Signed)
 PHARMACY - ANTICOAGULATION CONSULT NOTE  Pharmacy Consult for IV heparin  Indication: DVT  Allergies  Allergen Reactions   Morphine  And Codeine Anaphylaxis   Shellfish Allergy Anaphylaxis   Betadine  [Povidone Iodine ] Itching   Bupropion Other (See Comments)    Shaking of the body, hallucinations    Chlorhexidine  Hives    Patient reports never had issues CHG with mouth rinse.   Influenza Vaccines Hives   Metoprolol  Rash    Patient Measurements: Height: 6' (182.9 cm) Weight: 109 kg (240 lb 4.8 oz) IBW/kg (Calculated) : 77.6 HEPARIN  DW (KG): 104.1  Vital Signs: Temp: 98.4 F (36.9 C) (07/24 0821) Temp Source: Oral (07/24 0821) BP: 146/104 (07/24 0821) Pulse Rate: 95 (07/24 0821)  Labs: Recent Labs    10/07/23 0638 10/08/23 0530 10/09/23 0443  HGB 9.6* 9.5* 9.5*  HCT 29.1* 28.9* 29.3*  PLT 464* 481* 430*  HEPARINUNFRC 0.40 0.43 0.29*  CREATININE 1.24  --   --     Estimated Creatinine Clearance: 81.8 mL/min (by C-G formula based on SCr of 1.24 mg/dL).   Assessment: 58 YO male with new DVT despite previous enoxaparin  50 mg daily. Patient received one dose of apixaban  10mg  on PM of 7/16; pharmacy consulted to dose heparin  given plans for surgery in near future.   7/24 AM: heparin  level slightly subtherapeutic on 2700 units/hr. The infusion was paused briefly this morning which likely caused the level to fall below goal after being therapeutic. No overt s/sx of bleeding to report. CBC stable- Since previously therapeutic multiple times will leave rate alone.  Goal of Therapy:  Heparin  level 0.3-0.7 units/ml Monitor platelets by anticoagulation protocol: Yes   Plan:  Continue infusion rate to 2700 units/hr   Daily heparin  level, CBC Monitor for s/sx of bleeding F/u to PO after surgery   Koren Or, PharmD Clinical Pharmacist 10/09/2023 11:13 AM Please check AMION for all [facility] numbers

## 2023-10-10 ENCOUNTER — Inpatient Hospital Stay (HOSPITAL_COMMUNITY)

## 2023-10-10 DIAGNOSIS — B449 Aspergillosis, unspecified: Secondary | ICD-10-CM | POA: Diagnosis not present

## 2023-10-10 DIAGNOSIS — R6521 Severe sepsis with septic shock: Secondary | ICD-10-CM | POA: Diagnosis not present

## 2023-10-10 DIAGNOSIS — A419 Sepsis, unspecified organism: Secondary | ICD-10-CM | POA: Diagnosis not present

## 2023-10-10 DIAGNOSIS — J181 Lobar pneumonia, unspecified organism: Secondary | ICD-10-CM | POA: Diagnosis not present

## 2023-10-10 DIAGNOSIS — C9000 Multiple myeloma not having achieved remission: Secondary | ICD-10-CM | POA: Diagnosis not present

## 2023-10-10 HISTORY — PX: IR SCLEROTHERAPY OF A FLUID COLLECTION: IMG6090

## 2023-10-10 LAB — CBC
HCT: 32.5 % — ABNORMAL LOW (ref 39.0–52.0)
Hemoglobin: 10.5 g/dL — ABNORMAL LOW (ref 13.0–17.0)
MCH: 34.7 pg — ABNORMAL HIGH (ref 26.0–34.0)
MCHC: 32.3 g/dL (ref 30.0–36.0)
MCV: 107.3 fL — ABNORMAL HIGH (ref 80.0–100.0)
Platelets: 417 K/uL — ABNORMAL HIGH (ref 150–400)
RBC: 3.03 MIL/uL — ABNORMAL LOW (ref 4.22–5.81)
RDW: 18.9 % — ABNORMAL HIGH (ref 11.5–15.5)
WBC: 5.5 K/uL (ref 4.0–10.5)
nRBC: 0 % (ref 0.0–0.2)

## 2023-10-10 LAB — GLUCOSE, CAPILLARY
Glucose-Capillary: 176 mg/dL — ABNORMAL HIGH (ref 70–99)
Glucose-Capillary: 160 mg/dL — ABNORMAL HIGH (ref 70–99)
Glucose-Capillary: 229 mg/dL — ABNORMAL HIGH (ref 70–99)
Glucose-Capillary: 258 mg/dL — ABNORMAL HIGH (ref 70–99)
Glucose-Capillary: 193 mg/dL — ABNORMAL HIGH (ref 70–99)

## 2023-10-10 LAB — UPEP/UIFE/LIGHT CHAINS/TP, 24-HR UR
% BETA, Urine: 39.7 %
ALPHA 1 URINE: 7.3 %
Albumin, U: 22.5 %
Alpha 2, Urine: 17.4 %
Free Kappa Lt Chains,Ur: 23.75 mg/L (ref 1.17–86.46)
Free Kappa/Lambda Ratio: 4.48 (ref 1.83–14.26)
Free Lambda Lt Chains,Ur: 5.3 mg/L (ref 0.27–15.21)
GAMMA GLOBULIN URINE: 13.1 %
M-SPIKE %, Urine: 19.3 % — ABNORMAL HIGH
M-Spike, Mg/24 Hr: 46 mg/(24.h) — ABNORMAL HIGH
Total Protein, Urine-Ur/day: 237 mg/(24.h) — ABNORMAL HIGH (ref 30–150)
Total Protein, Urine: 5.7 mg/dL

## 2023-10-10 LAB — HEPARIN LEVEL (UNFRACTIONATED): Heparin Unfractionated: 0.41 [IU]/mL (ref 0.30–0.70)

## 2023-10-10 MED ORDER — LIDOCAINE-EPINEPHRINE 1 %-1:100000 IJ SOLN
INTRAMUSCULAR | Status: AC
  Filled 2023-10-10: qty 1

## 2023-10-10 MED ORDER — MIDAZOLAM HCL 2 MG/2ML IJ SOLN
INTRAMUSCULAR | Status: AC | PRN
Start: 2023-10-10 — End: 2023-10-10
  Administered 2023-10-10 (×2): 1 mg via INTRAVENOUS

## 2023-10-10 MED ORDER — FENTANYL CITRATE (PF) 100 MCG/2ML IJ SOLN
INTRAMUSCULAR | Status: AC
  Filled 2023-10-10: qty 2

## 2023-10-10 MED ORDER — FENTANYL CITRATE (PF) 100 MCG/2ML IJ SOLN
INTRAMUSCULAR | Status: AC | PRN
  Administered 2023-10-10: 50 ug via INTRAVENOUS

## 2023-10-10 MED ORDER — IOHEXOL 300 MG/ML  SOLN
50.0000 mL | Freq: Once | INTRAMUSCULAR | Status: AC | PRN
  Administered 2023-10-10: 10 mL

## 2023-10-10 MED ORDER — MIDAZOLAM HCL 2 MG/2ML IJ SOLN
INTRAMUSCULAR | Status: AC
  Filled 2023-10-10: qty 2

## 2023-10-10 MED ORDER — VERAPAMIL HCL ER 120 MG PO TBCR
120.0000 mg | EXTENDED_RELEASE_TABLET | Freq: Every day | ORAL | Status: DC
  Administered 2023-10-10 – 2023-11-07 (×31): 120 mg via ORAL
  Filled 2023-10-10 (×30): qty 1

## 2023-10-10 NOTE — Plan of Care (Signed)
  Problem: Education: Goal: Knowledge of General Education information will improve Description: Including pain rating scale, medication(s)/side effects and non-pharmacologic comfort measures Outcome: Progressing   Problem: Health Behavior/Discharge Planning: Goal: Ability to manage health-related needs will improve Outcome: Progressing   Problem: Clinical Measurements: Goal: Ability to maintain clinical measurements within normal limits will improve Outcome: Progressing Goal: Will remain free from infection Outcome: Progressing Goal: Diagnostic test results will improve Outcome: Progressing Goal: Respiratory complications will improve Outcome: Progressing Goal: Cardiovascular complication will be avoided Outcome: Progressing   Problem: Activity: Goal: Risk for activity intolerance will decrease Outcome: Progressing   Problem: Nutrition: Goal: Adequate nutrition will be maintained Outcome: Progressing   Problem: Coping: Goal: Level of anxiety will decrease Outcome: Progressing   Problem: Elimination: Goal: Will not experience complications related to bowel motility Outcome: Progressing Goal: Will not experience complications related to urinary retention Outcome: Progressing   Problem: Pain Managment: Goal: General experience of comfort will improve and/or be controlled Outcome: Progressing   Problem: Safety: Goal: Ability to remain free from injury will improve Outcome: Progressing   Problem: Skin Integrity: Goal: Risk for impaired skin integrity will decrease Outcome: Progressing   Problem: Education: Goal: Ability to describe self-care measures that may prevent or decrease complications (Diabetes Survival Skills Education) will improve Outcome: Progressing Goal: Individualized Educational Video(s) Outcome: Progressing   Problem: Coping: Goal: Ability to adjust to condition or change in health will improve Outcome: Progressing   Problem: Fluid  Volume: Goal: Ability to maintain a balanced intake and output will improve Outcome: Progressing   Problem: Health Behavior/Discharge Planning: Goal: Ability to identify and utilize available resources and services will improve Outcome: Progressing Goal: Ability to manage health-related needs will improve Outcome: Progressing   Problem: Metabolic: Goal: Ability to maintain appropriate glucose levels will improve Outcome: Progressing   Problem: Nutritional: Goal: Maintenance of adequate nutrition will improve Outcome: Progressing Goal: Progress toward achieving an optimal weight will improve Outcome: Progressing   Problem: Skin Integrity: Goal: Risk for impaired skin integrity will decrease Outcome: Progressing   Problem: Tissue Perfusion: Goal: Adequacy of tissue perfusion will improve Outcome: Progressing   Problem: Education: Goal: Knowledge about tracheostomy care/management will improve Outcome: Progressing   Problem: Activity: Goal: Ability to tolerate increased activity will improve Outcome: Progressing   Problem: Health Behavior/Discharge Planning: Goal: Ability to manage tracheostomy will improve Outcome: Progressing   Problem: Respiratory: Goal: Patent airway maintenance will improve Outcome: Progressing   Problem: Role Relationship: Goal: Ability to communicate will improve Outcome: Progressing   Problem: Education: Goal: Knowledge of disease and its progression will improve Outcome: Progressing Goal: Individualized Educational Video(s) Outcome: Progressing   Problem: Fluid Volume: Goal: Compliance with measures to maintain balanced fluid volume will improve Outcome: Progressing   Problem: Health Behavior/Discharge Planning: Goal: Ability to manage health-related needs will improve Outcome: Progressing   Problem: Nutritional: Goal: Ability to make healthy dietary choices will improve Outcome: Progressing   Problem: Clinical  Measurements: Goal: Complications related to the disease process, condition or treatment will be avoided or minimized Outcome: Progressing

## 2023-10-10 NOTE — Progress Notes (Signed)
 Physical Therapy Treatment Patient Details Name: Frank [name]. MRN: [id number] DOB: 06-30-1965 Today's Date: 10/10/2023   History of Present Illness 58 y.o. male transferred from [facility] 07/03/23 to [facility] for management of newly diagnosed plasma cell myeloma with weakness and AMS. Pt also with AKI and metabolic encephalopathy. 5/10 transfer to [facility] for iHD. 4/22 Intubated and chemo initiated. 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 5/6 IVIG started. 5/8-6/11 trach. 5/12 iHD initiated. 5/29 PEG placed. 6/5 chemo initiated. PMHx:Lt THA, carpal tunnel syndrome, Lt TKA, Lt humerus fx, PAF, HTN, HLD, bipolar disorder, obesity, T2DM    PT Comments  STAR PT/OT: Pt had procedure earlier this am and is groggy from the anesthesia, requesting not to do much today. However, as he moved around pt reports feeling better. Pt is looking forward to L UE surgery on Monday being over. Today, pt minA for getting to [facility]. Once there pt was able to stand up using the back of the recliner to pull up x3, with min-modAx2, modAx1. Pt is very pleased with his efforts today. PT will follow back next week after surgery.      If plan is discharge home, recommend the following: Two people to help with walking and/or transfers;Assistance with cooking/housework;Assist for transportation;Two people to help with bathing/dressing/bathroom;Direct supervision/assist for medications management;Direct supervision/assist for financial management;Help with stairs or ramp for entrance;Assistance with feeding   Can travel by private vehicle     No  Equipment Recommendations  Hospital bed;Hoyer lift;Wheelchair (measurements PT);Wheelchair cushion (measurements PT);BSC/3in1       Precautions / Restrictions Precautions Precautions: Fall Recall of Precautions/Restrictions: Impaired Precaution/Restrictions Comments: plan for amputation, Dr Kendal okay with arm in sling Required Braces or Orthoses: Sling Restrictions Weight Bearing  Restrictions Per Provider Order: Yes LUE Weight Bearing Per Provider Order: Non weight bearing Other Position/Activity Restrictions: Lt humerus non-union, splinted. no elbow ROM, but shoulder ROM ok per secure chat with Dr. Jonel 7/1     Mobility  Bed Mobility Overal bed mobility: Needs Assistance Bed Mobility: Rolling, Supine to Sit, Sit to Supine     Supine to sit: HOB elevated, Used rails, Min assist Sit to supine: +2 for physical assistance, Total assist   General bed mobility comments: allowed increased time and pt was able to manage LE off the bed and wiggle his L hip forward and get foot to the floor ulitmately needs min A for bringing his trunk to upright, requires mod A for moving L hip all the way to the [facility] mainly due to increased friction from bed pad    Transfers Overall transfer level: Needs assistance   Transfers: Sit to/from Stand, Bed to chair/wheelchair/BSC Sit to Stand: +2 physical assistance, +2 safety/equipment, Mod assist, Min assist           General transfer comment: minAx2 for 1x sit<>stand and modAx2 for sit<>stand and then modAx1 for lifting Hips to place clean linens          Balance Overall balance assessment: Needs assistance Sitting-balance support: Single extremity supported, Feet supported Sitting balance-Leahy Scale: Fair Sitting balance - Comments: EOB Postural control: Right lateral lean Standing balance support: Single extremity supported, During functional activity, Reliant on assistive device for balance Standing balance-Leahy Scale: Zero Standing balance comment: uses back of recliner to maintain standing                            Communication Communication Communication: Impaired Factors Affecting Communication: Reduced  clarity of speech  Cognition Arousal: Alert Behavior During Therapy: WFL for tasks assessed/performed                             Following commands: Impaired Following commands  impaired: Follows one step commands with increased time    Cueing Cueing Techniques: Verbal cues, Tactile cues, Visual cues     General Comments General comments (skin integrity, edema, etc.): VSS on RA      Pertinent Vitals/Pain Pain Assessment Pain Assessment: Faces Faces Pain Scale: Hurts even more Pain Location: left shoulder and Right knee when WBing Pain Descriptors / Indicators: Discomfort, Grimacing, Sharp, Moaning Pain Intervention(s): Limited activity within patient's tolerance, Monitored during session, Repositioned     PT Goals (current goals can now be found in the care plan section) Acute Rehab PT Goals PT Goal Formulation: With patient Time For Goal Achievement: 10/23/23 Potential to Achieve Goals: Fair Progress towards PT goals: Progressing toward goals    Frequency    Min 5X/week           Co-evaluation PT/OT/SLP Co-Evaluation/Treatment: Yes Reason for Co-Treatment: For patient/therapist safety;To address functional/ADL transfers PT goals addressed during session: Mobility/safety with mobility;Balance OT goals addressed during session: ADL's and self-care      AM-PAC PT 6 Clicks Mobility   Outcome Measure  Help needed turning from your back to your side while in a flat bed without using bedrails?: A Lot Help needed moving from lying on your back to sitting on the side of a flat bed without using bedrails?: Total Help needed moving to and from a bed to a chair (including a wheelchair)?: Total Help needed standing up from a chair using your arms (e.g., wheelchair or bedside chair)?: Total Help needed to walk in hospital room?: Total Help needed climbing 3-5 steps with a railing? : Total 6 Click Score: 7    End of Session Equipment Utilized During Treatment: Gait belt Activity Tolerance: Patient tolerated treatment well;Patient limited by fatigue Patient left: in bed;with call bell/phone within reach Nurse Communication: Mobility status PT  Visit Diagnosis: Other abnormalities of gait and mobility (R26.89);Muscle weakness (generalized) (M62.81);Difficulty in walking, not elsewhere classified (R26.2)     Time: 8793-8756 PT Time Calculation (min) (ACUTE ONLY): 37 min  Charges:    $Therapeutic Activity: 8-22 mins PT General Charges $$ ACUTE PT VISIT: 1 Visit                     Margrett Kalb B. Fleeta Lapidus PT, DPT Acute Rehabilitation Services Please use secure chat or  Call Office [phone]    [name] [facility] 10/10/2023, 12:52 PM

## 2023-10-10 NOTE — Progress Notes (Signed)
 PROGRESS NOTE  Frank [name]. FMW:[id number] DOB: 10/17/1965   PCP: [name], MD  Patient is from: Home.  DOA: 07/03/2023 LOS: 99  Chief complaints No chief complaint on file.    Brief Narrative / Interim history: 58 year old PMH OSA CPAP, pathological fracture and multiple ORIF left Humerus likely due to multiple myeloma, A-fib not on anticoagulation, hyperlipidemia, tobacco use disorder who was feeling weak for 1 week, admitted to [facility] health with concern of sepsis, community-acquired pneumonia and acute encephalopathy. Patient was found to have lytic lesions, he was transferred to [facility] for oncology treatment. In the meantime he developed encephalopathy, acute renal failure. He remained in the ICU for 36 days. Underwent tracheostomy. He was also started on HD after CRRT,off of HD now. Subsequently PEG tube placed 5/29 and IR drain placement of right thigh hip infected hematoma on 7/11 . S/p PEG tube removal. Needs Port-A-Cath placement (for outpatient chemo), either during this hospitalization or as an outpatient. F/u IR recommendations for drain management. Started on heparin  drip on 7/17 because of b/l LE DVT. Plan for left arm amputation by [name] on 7/28.   Subjective: Seen and examined earlier this morning.  No major events overnight or this morning.  No complaints other than some pain in his arm.  Objective: Vitals:   10/10/23 1025 10/10/23 1040 10/10/23 1050 10/10/23 1100  BP: (!) 147/92 (!) 147/116 (!) 144/111 (!) 143/106  Pulse: (!) 107 (!) 110 (!) 107 (!) 106  Resp: 20 16 16 18   Temp:      TempSrc:      SpO2: 97% 94%  95%  Weight:      Height:        Examination:  GENERAL: No apparent distress.  Nontoxic. HEENT: MMM.  Vision and hearing grossly intact.  NECK: Supple.  No apparent JVD.  RESP:  No IWOB.  Fair aeration bilaterally. CVS:  RRR. Heart sounds normal.  ABD/GI/GU: BS+. Abd soft, NTND.  MSK/EXT:  Moves extremities. No apparent  deformity. No edema.  SKIN: no apparent skin lesion or wound NEURO: Awake and alert.  Oriented x 4.  BLE weakness. PSYCH: Calm. Normal affect.   Consultants:  Oncology Orthopedic surgery Infectious disease Palliative medicine   Assessment and plan: Closed fracture of left distal humerus History of Pathological fracture and multiple ORIF left Humerus 01/2023  -Plan for surgical amputation on 7/28 by [name]. -Pain fairly controlled.  Infected right flank/gluteal hematoma -Underwent IR drain placement for infected Hematoma on 7/11.  -Drain exchange on 7/25. -Was on multiple regimen.  Now on Zyvox  until 7/26 and Augmentin  until 7/25   Sepsis in the setting of Staph Epi Bacteremia -blood culture with MRSE in 1 out of 4 bottles.  Completed a course of vancomycin  with the end date 6/20.  He did receive a line holiday.  Repeat blood culture 6/9 without growth.  Currently on Zyvox  until 7/26.   Multiple myeloma: Hematology initiated chemotherapy on 08/21/2023. Initially given Velcade /Cytoxan . Now on Sarclisa /Kyprolis /Cytoxan .   -Cycle 2 chemotherapy will be done as an out patient. -Needs Port-A-cath placement, likely closer to discharge or as an outpatient. -Myeloma responding well to treatment, IgG decreased from 11,507 to WNL 1470. -Dr [name] on board.   AKI on CKD-3A, uremia,  Hyponatremia, Hypokalemia -Due to MM, hypercalcemia and ACE inhibitors?  Nephrology consulted, was on hemodialysis but eventually he tolerated being off HD, tunneled catheter discontinued per nephrology.  Renal function is stable.  B/l LE DVT:  US  venous doppler done on 7/16, positive for DVT in his RLE PTV, left CFV, SFJ, and PFV. -Continue heparin  infusion.  Will transition to DOAC after surgery.  Left sided pleuritic chest wall pain:  No rib fractures seen on CXR -Prn analgesics   Aspergillosis with pneumonia -On Voriconazole  per ID.  End date for voriconazole  is 8/5, continue.   Acute Hypoxic  Respiratory Failure-trach removed 6/11.  Decannulated.   Severe acute metabolic encephalopathy with hypoactive delirium, seizure disorder - Normal MRI of the brain.  LP negative for meningitis or encephalitis.  EEG negative for seizures but encephalopathy.  -Resolved   Insulin -dependent diabetes type 2- A1c 6.0, continue with sliding scale insulin .     Paroxysmal A-fib: -Resume home verapamil . -On IV heparin   Essential hypertension: BP slightly elevated. -Resume home verapamil    Obstructive Sleep Apnea -Trach removed. CPAP    Anxiety/Bipolar disorder - Continue with Paxil .     Class II obesity Body mass index is 32.29 kg/m.  Severe protein calorie Malnutrition - Advanced to regular diet.  PEG tube was removed on 10/01/2023. Nutrition Problem: Severe Malnutrition Etiology: acute illness (prolonged illness, interuptions in feeding) Signs/Symptoms: moderate fat depletion, moderate muscle depletion, percent weight loss (9.6% x 1 month) Percent weight loss: 9.6 % Interventions: Ensure Enlive (each supplement provides 350kcal and 20 grams of protein), MVI, Liberalize Diet, Prostat, Juven  Pressure skin injury: Pressure Injury 07/05/23 Heel Left;Right Unstageable - Full thickness tissue loss in which the base of the injury is covered by slough (yellow, tan, gray, green or brown) and/or eschar (tan, brown or black) in the wound bed. (Active)  07/05/23 1108  Location: Heel  Location Orientation: Left;Right  Staging: Unstageable - Full thickness tissue loss in which the base of the injury is covered by slough (yellow, tan, gray, green or brown) and/or eschar (tan, brown or black) in the wound bed.  Wound Description (Comments):   DO NOT USE:  Present on Admission: Yes  Dressing Type None 10/04/23 2030     Pressure Injury 07/11/23 Sacrum Mid Unstageable - Full thickness tissue loss in which the base of the injury is covered by slough (yellow, tan, gray, green or brown) and/or eschar (tan,  brown or black) in the wound bed. (Active)  07/11/23 1600  Location: Sacrum  Location Orientation: Mid  Staging: Unstageable - Full thickness tissue loss in which the base of the injury is covered by slough (yellow, tan, gray, green or brown) and/or eschar (tan, brown or black) in the wound bed.  Wound Description (Comments):   DO NOT USE:  Present on Admission: No  Dressing Type Gauze (Comment);Santyl ;Barrier Film (skin prep);Foam - Lift dressing to assess site every shift 10/07/23 1400     Pressure Injury 07/27/23 Buttocks Right;Lower (Active)  07/27/23 1700  Location: Buttocks  Location Orientation: Right;Lower  Staging:   Wound Description (Comments):   DO NOT USE:  Present on Admission: Yes  Dressing Type None 10/04/23 2030     Pressure Injury 08/25/23 Buttocks Left Stage 2 -  Partial thickness loss of dermis presenting as a shallow open injury with a red, pink wound bed without slough. (Active)  08/25/23   Location: Buttocks  Location Orientation: Left  Staging: Stage 2 -  Partial thickness loss of dermis presenting as a shallow open injury with a red, pink wound bed without slough.  Wound Description (Comments):   DO NOT USE:  Present on Admission: No  Dressing Type None 10/04/23 2030   DVT prophylaxis:  Place  and maintain sequential compression device Start: 07/14/23 1033  Code Status: Full code Family Communication: None at bedside Level of care: Telemetry Medical Status is: Inpatient due to acute illness   Final disposition: SNF   35 minutes with more than 50% spent in reviewing records, counseling patient/family and coordinating care.   Sch Meds:  Scheduled Meds:  (feeding supplement) PROSource Plus  30 mL Oral TID BM   acyclovir   400 mg Oral BID   collagenase    Topical Daily   epoetin  alfa  40,000 Units Subcutaneous Q Wed-1800   feeding supplement  237 mL Oral BID WC   ferrous sulfate   325 mg Oral Q breakfast   guaiFENesin   600 mg Oral BID   hydrALAZINE    25 mg Oral Q8H   insulin  aspart  0-15 Units Subcutaneous TID AC & HS   insulin  aspart  2 Units Subcutaneous TID AC & HS   insulin  glargine-yfgn  10 Units Subcutaneous Q24H   lidocaine   1 patch Transdermal Q24H   montelukast   5 mg Oral QHS   multivitamin with minerals  1 tablet Oral Daily   nutrition supplement (JUVEN)  1 packet Oral BID AC   mouth rinse  15 mL Mouth Rinse 4 times per day   pantoprazole   40 mg Oral BID   PARoxetine   40 mg Oral Daily   polyethylene glycol  17 g Oral BID   senna  1 tablet Oral Daily   sodium chloride  flush  10-40 mL Intracatheter Q12H   umeclidinium-vilanterol  1 puff Inhalation Daily   valproic  acid  750 mg Oral BID   verapamil   120 mg Oral Daily   voriconazole   400 mg Oral Q12H   Continuous Infusions:  sodium chloride  Stopped (08/28/23 1208)   sodium chloride  Stopped (08/28/23 9160)   heparin  2,700 Units/hr (10/10/23 0839)   PRN Meds:.acetaminophen  (TYLENOL ) oral liquid 160 mg/5 mL **OR** acetaminophen , alum & mag hydroxide-simeth, benzonatate , hydrOXYzine , ipratropium-albuterol , ondansetron  (ZOFRAN ) IV, oxyCODONE , artificial tears, sodium chloride  flush  Antimicrobials: Anti-infectives (From admission, onward)    Start     Dose/Rate Route Frequency Ordered Stop   10/10/23 0900  doxycycline  200mg  in 20 mL SWI irrigation / sclerosing solution for IR       Note to Pharmacy: For irrigation only. WARNING - SCLEROSING AGENT  #5 vials sent to IR for procedure   500 mg Irrigation To Radiology 10/07/23 1418 10/10/23 1015   10/10/23 0000  doxycycline  (VIBRAMYCIN ) 500 mg in dextrose  5 % 250 mL IVPB  Status:  Discontinued        500 mg 125 mL/hr over 120 Minutes Intravenous To Radiology 10/07/23 1402 10/07/23 1409   10/10/23 0000  doxycycline  200mg  in 20 mL SWI irrigation / sclerosing solution for IR  Status:  Discontinued        200 mg Irrigation To Radiology 10/07/23 1418 10/07/23 1456   10/10/23 0000  doxycycline  200mg  in 20 mL SWI irrigation /  sclerosing solution for IR  Status:  Discontinued        200 mg Irrigation To Radiology 10/07/23 1440 10/07/23 1456   10/02/23 2200  linezolid  (ZYVOX ) tablet 600 mg        600 mg Oral Every 12 hours 10/01/23 0852 10/09/23 2108   10/02/23 1000  linezolid  (ZYVOX ) tablet 600 mg  Status:  Discontinued        600 mg Oral Every 12 hours 10/01/23 0723 10/01/23 0852   09/30/23 1300  vancomycin  variable dose per unstable renal function (  pharmacist dosing)  Status:  Discontinued         Does not apply See admin instructions 09/30/23 1301 10/01/23 0725   09/29/23 1145  amoxicillin -clavulanate (AUGMENTIN ) 875-125 MG per tablet 1 tablet        1 tablet Oral Every 12 hours 09/29/23 1047 10/09/23 2108   09/27/23 0200  vancomycin  (VANCOREADY) IVPB 1500 mg/300 mL        1,500 mg 150 mL/hr over 120 Minutes Intravenous Every 12 hours 09/26/23 1402 09/30/23 1448   09/26/23 1500  cefTRIAXone  (ROCEPHIN ) 2 g in sodium chloride  0.9 % 100 mL IVPB  Status:  Discontinued        2 g 200 mL/hr over 30 Minutes Intravenous Every 24 hours 09/26/23 1402 09/29/23 1047   09/26/23 1315  vancomycin  (VANCOREADY) IVPB 2000 mg/400 mL        2,000 mg 200 mL/hr over 120 Minutes Intravenous  Once 09/26/23 1220 09/26/23 1656   09/26/23 1230  metroNIDAZOLE  (FLAGYL ) IVPB 500 mg  Status:  Discontinued        500 mg 100 mL/hr over 60 Minutes Intravenous Every 12 hours 09/26/23 1137 09/29/23 1138   09/22/23 2200  voriconazole  (VFEND ) tablet 400 mg        400 mg Oral Every 12 hours 09/22/23 1521 10/21/23 2359   09/11/23 2200  acyclovir  (ZOVIRAX ) 200 MG capsule 400 mg       Note to Pharmacy: Give after dialysis the pm dose, on dialysis days   400 mg Oral 2 times daily 09/11/23 1551     09/11/23 2200  voriconazole  (VFEND ) tablet 350 mg  Status:  Discontinued        350 mg Oral Every 12 hours 09/11/23 1551 09/22/23 1521   09/07/23 1000  acyclovir  (ZOVIRAX ) 200 MG capsule 400 mg  Status:  Discontinued       Note to Pharmacy: Give after  dialysis the pm dose, on dialysis days   400 mg Per Tube 2 times daily 09/07/23 0825 09/11/23 1551   09/03/23 1200  vancomycin  (VANCOCIN ) IVPB 1000 mg/200 mL premix  Status:  Discontinued        1,000 mg 200 mL/hr over 60 Minutes Intravenous Every M-W-F (Hemodialysis) 09/02/23 1021 09/03/23 0942   09/03/23 0942  vancomycin  variable dose per unstable renal function (pharmacist dosing)  Status:  Discontinued         Does not apply See admin instructions 09/03/23 0942 09/03/23 0945   09/02/23 0915  vancomycin  (VANCOCIN ) IVPB 1000 mg/200 mL premix        1,000 mg 200 mL/hr over 60 Minutes Intravenous  Once 09/02/23 0830 09/02/23 1215   09/01/23 0834  ceFAZolin  (ANCEF ) IVPB 2g/100 mL premix        over 30 Minutes Intravenous Continuous PRN 09/01/23 0834 09/01/23 0834   08/29/23 0914  vancomycin  variable dose per unstable renal function (pharmacist dosing)  Status:  Discontinued         Does not apply See admin instructions 08/29/23 0914 09/02/23 1021   08/27/23 1400  vancomycin  (VANCOCIN ) IVPB 1000 mg/200 mL premix        1,000 mg 200 mL/hr over 60 Minutes Intravenous  Once 08/27/23 0920 08/27/23 1653   08/25/23 1200  vancomycin  (VANCOCIN ) IVPB 1000 mg/200 mL premix        1,000 mg 200 mL/hr over 60 Minutes Intravenous Every M-W-F (Hemodialysis) 08/24/23 1405 08/29/23 1240   08/24/23 1445  vancomycin  (VANCOREADY) IVPB 2000 mg/400 mL  2,000 mg 200 mL/hr over 120 Minutes Intravenous  Once 08/24/23 1353 08/25/23 0323   08/14/23 0600  ceFAZolin  (ANCEF ) IVPB 2g/100 mL premix        2 g 200 mL/hr over 30 Minutes Intravenous To Radiology 08/13/23 1055 08/14/23 0606   08/09/23 1000  acyclovir  (ZOVIRAX ) 200 MG capsule 200 mg  Status:  Discontinued       Note to Pharmacy: Give after dialysis the pm dose, on dialysis days   200 mg Per Tube 2 times daily 08/08/23 2358 09/07/23 0825   08/09/23 0100  acyclovir  (ZOVIRAX ) 200 MG capsule 200 mg       Note to Pharmacy: Give after dialysis the pm dose,  on dialysis days   200 mg Per Tube  Once 08/09/23 0009 08/09/23 0013   08/08/23 2345  acyclovir  (ZOVIRAX ) 200 MG capsule 200 mg  Status:  Discontinued       Note to Pharmacy: Give after dialysis the pm dose, on dialysis days   200 mg Oral 2 times daily 08/08/23 2257 08/08/23 2358   08/07/23 1530  ceFAZolin  (ANCEF ) IVPB 2g/100 mL premix        over 30 Minutes Intravenous Continuous PRN 08/07/23 1555 08/07/23 1530   08/05/23 2200  voriconazole  (VFEND ) tablet 350 mg  Status:  Discontinued        350 mg Per Tube Every 12 hours 08/05/23 1511 09/11/23 1551   08/01/23 1200  vancomycin  (VANCOCIN ) IVPB 1000 mg/200 mL premix        1,000 mg 200 mL/hr over 60 Minutes Intravenous Every M-W-F (Hemodialysis) 07/31/23 0649 08/01/23 1652   07/30/23 1200  vancomycin  (VANCOCIN ) IVPB 1000 mg/200 mL premix        1,000 mg 200 mL/hr over 60 Minutes Intravenous Every M-W-F (Hemodialysis) 07/30/23 0851 07/30/23 1241   07/29/23 0900  vancomycin  (VANCOCIN ) IVPB 1000 mg/200 mL premix        1,000 mg 200 mL/hr over 60 Minutes Intravenous  Once 07/29/23 0812 07/29/23 0943   07/28/23 1204  vancomycin  variable dose per unstable renal function (pharmacist dosing)  Status:  Discontinued         Does not apply See admin instructions 07/28/23 1204 07/31/23 0649   07/28/23 1000  voriconazole  (VFEND ) 350 mg in sodium chloride  0.9 % 100 mL IVPB  Status:  Discontinued       Placed in Followed by Linked Group   4 mg/kg  88.6 kg (Adjusted) 67.5 mL/hr over 120 Minutes Intravenous Every 12 hours 07/28/23 0748 08/05/23 1511   07/27/23 1000  voriconazole  (VFEND ) 400 mg in sodium chloride  0.9 % 100 mL IVPB  Status:  Discontinued       Placed in Followed by Linked Group   4 mg/kg  100.6 kg 70 mL/hr over 120 Minutes Intravenous Every 12 hours 07/26/23 0826 07/28/23 0748   07/27/23 0945  vancomycin  (VANCOREADY) IVPB 2000 mg/400 mL        2,000 mg 200 mL/hr over 120 Minutes Intravenous  Once 07/27/23 0849 07/27/23 1237    07/26/23 1515  Ampicillin -Sulbactam (UNASYN ) 3 g in sodium chloride  0.9 % 100 mL IVPB  Status:  Discontinued        3 g 200 mL/hr over 30 Minutes Intravenous Every 8 hours 07/26/23 1418 07/27/23 0830   07/26/23 0915  voriconazole  (VFEND ) 600 mg in sodium chloride  0.9 % 150 mL IVPB       Placed in Followed by Linked Group   6 mg/kg  100.6 kg 105 mL/hr  over 120 Minutes Intravenous Every 12 hours 07/26/23 0826 07/27/23 0048   07/26/23 0900  Ampicillin -Sulbactam (UNASYN ) 3 g in sodium chloride  0.9 % 100 mL IVPB  Status:  Discontinued        3 g 200 mL/hr over 30 Minutes Intravenous Every 12 hours 07/26/23 0826 07/26/23 1418   07/23/23 1000  levofloxacin  (LEVAQUIN ) IVPB 500 mg  Status:  Discontinued       Placed in Followed by Linked Group   500 mg 100 mL/hr over 60 Minutes Intravenous Every 24 hours 07/22/23 1213 07/23/23 1001   07/23/23 1000  levofloxacin  (LEVAQUIN ) IVPB 500 mg  Status:  Discontinued       Placed in Followed by Linked Group   500 mg 100 mL/hr over 60 Minutes Intravenous Every 24 hours 07/23/23 1001 07/28/23 0804   07/22/23 1300  levofloxacin  (LEVAQUIN ) IVPB 750 mg       Placed in Followed by Linked Group   750 mg 100 mL/hr over 90 Minutes Intravenous  Once 07/22/23 1213 07/22/23 1542   07/21/23 1600  voriconazole  (VFEND ) 300 mg in sodium chloride  0.9 % 100 mL IVPB  Status:  Discontinued        300 mg 65 mL/hr over 120 Minutes Intravenous Every 12 hours 07/21/23 1430 07/22/23 1415   07/21/23 1000  voriconazole  (VFEND ) tablet 200 mg  Status:  Discontinued        200 mg Oral Every 12 hours 07/20/23 0944 07/21/23 1015   07/20/23 1030  voriconazole  (VFEND ) 530 mg in sodium chloride  0.9 % 150 mL IVPB        6 mg/kg  88.6 kg (Adjusted) 101.5 mL/hr over 120 Minutes Intravenous Every 12 hours 07/20/23 0944 07/21/23 0030   07/19/23 1400  meropenem  (MERREM ) 1 g in sodium chloride  0.9 % 100 mL IVPB  Status:  Discontinued        1 g 200 mL/hr over 30 Minutes Intravenous  Every 8 hours 07/19/23 1143 07/22/23 1213   07/18/23 0600  meropenem  (MERREM ) 1 g in sodium chloride  0.9 % 100 mL IVPB  Status:  Discontinued        1 g 200 mL/hr over 30 Minutes Intravenous Every 24 hours 07/17/23 0916 07/19/23 1143   07/17/23 0700  meropenem  (MERREM ) 1 g in sodium chloride  0.9 % 100 mL IVPB  Status:  Discontinued        1 g 200 mL/hr over 30 Minutes Intravenous Every 12 hours 07/17/23 0601 07/17/23 0916   07/15/23 1300  vancomycin  (VANCOCIN ) IVPB 1000 mg/200 mL premix  Status:  Discontinued        1,000 mg 200 mL/hr over 60 Minutes Intravenous Every 24 hours 07/14/23 1129 07/22/23 1319   07/14/23 1215  vancomycin  (VANCOREADY) IVPB 1750 mg/350 mL        1,750 mg 175 mL/hr over 120 Minutes Intravenous  Once 07/14/23 1129 07/14/23 1449   07/09/23 1800  piperacillin -tazobactam (ZOSYN ) IVPB 3.375 g  Status:  Discontinued        3.375 g 12.5 mL/hr over 240 Minutes Intravenous Every 12 hours 07/09/23 0756 07/09/23 0844   07/09/23 1400  piperacillin -tazobactam (ZOSYN ) IVPB 3.375 g  Status:  Discontinued        3.375 g 12.5 mL/hr over 240 Minutes Intravenous Every 8 hours 07/09/23 0844 07/17/23 0557   07/08/23 1300  piperacillin -tazobactam (ZOSYN ) IVPB 3.375 g  Status:  Discontinued        3.375 g 12.5 mL/hr over 240 Minutes Intravenous Every 8 hours 07/08/23  1129 07/09/23 0756   07/05/23 2200  azithromycin  (ZITHROMAX ) 500 mg in sodium chloride  0.9 % 250 mL IVPB        500 mg 250 mL/hr over 60 Minutes Intravenous Daily at bedtime 07/05/23 2022 07/06/23 2302   07/05/23 2115  azithromycin  (ZITHROMAX ) 250 mg in dextrose  5 % 125 mL IVPB  Status:  Discontinued       Note to Pharmacy: Missed dose this morning   250 mg 127.5 mL/hr over 60 Minutes Intravenous Every 24 hours 07/05/23 2016 07/05/23 2021   07/04/23 0900  fluconazole  (DIFLUCAN ) IVPB 200 mg  Status:  Discontinued        200 mg 100 mL/hr over 60 Minutes Intravenous Every 48 hours 07/04/23 0722 07/09/23 0809   07/03/23  0600  cefTRIAXone  (ROCEPHIN ) 1 g in sodium chloride  0.9 % 100 mL IVPB  Status:  Discontinued        1 g 200 mL/hr over 30 Minutes Intravenous Every 24 hours 07/03/23 0508 07/08/23 1104   07/03/23 0600  azithromycin  (ZITHROMAX ) tablet 250 mg  Status:  Discontinued        250 mg Oral Daily 07/03/23 0508 07/05/23 2016        I have personally reviewed the following labs and images: CBC: Recent Labs  Lab 10/04/23 0328 10/05/23 0533 10/06/23 0614 10/07/23 0638 10/08/23 0530 10/09/23 0443 10/10/23 0420  WBC 4.8 4.4 5.2 5.1 6.0 5.3 5.5  NEUTROABS 2.2 2.0 2.9  --   --   --   --   HGB 8.7* 8.9* 9.4* 9.6* 9.5* 9.5* 10.5*  HCT 26.9* 27.4* 29.2* 29.1* 28.9* 29.3* 32.5*  MCV 107.6* 107.5* 107.7* 107.4* 106.6* 106.9* 107.3*  PLT 421* 432* 441* 464* 481* 430* 417*   BMP &GFR Recent Labs  Lab 10/04/23 0328 10/05/23 0533 10/06/23 0614 10/07/23 0638  NA 133* 133* 134* 132*  K 4.3 4.7 5.3* 4.6  CL 102 99 100 101  CO2 24 24 24 22   GLUCOSE 93 85 94 87  BUN 30* 27* 22* 23*  CREATININE 1.01 1.03 1.01 1.24  CALCIUM  8.5* 8.4* 8.6* 8.6*  MG  --   --   --  1.8   Estimated Creatinine Clearance: 81.5 mL/min (by C-G formula based on SCr of 1.24 mg/dL). Liver & Pancreas: Recent Labs  Lab 10/04/23 0328 10/05/23 0533 10/06/23 0614 10/07/23 0638  AST 9* 8* 10* 9*  ALT 6 6 7 5   ALKPHOS 78 84 89 83  BILITOT 0.6 0.2 0.4 0.5  PROT 5.2* 5.3* 5.5* 5.5*  ALBUMIN  2.0* 2.0* 2.2* 2.1*   No results for input(s): LIPASE, AMYLASE in the last 168 hours. No results for input(s): AMMONIA in the last 168 hours. Diabetic: No results for input(s): HGBA1C in the last 72 hours. Recent Labs  Lab 10/09/23 1209 10/09/23 1640 10/09/23 1944 10/10/23 0718 10/10/23 1132  GLUCAP 225* 96 163* 176* 160*   Cardiac Enzymes: No results for input(s): CKTOTAL, CKMB, CKMBINDEX, TROPONINI in the last 168 hours. No results for input(s): PROBNP in the last 8760 hours. Coagulation Profile: No  results for input(s): INR, PROTIME in the last 168 hours. Thyroid Function Tests: No results for input(s): TSH, T4TOTAL, FREET4, T3FREE, THYROIDAB in the last 72 hours. Lipid Profile: No results for input(s): CHOL, HDL, LDLCALC, TRIG, CHOLHDL, LDLDIRECT in the last 72 hours. Anemia Panel: No results for input(s): VITAMINB12, FOLATE, FERRITIN, TIBC, IRON, RETICCTPCT in the last 72 hours. Urine analysis:    Component Value Date/Time   COLORURINE AMBER (  A) 08/23/2023 0618   APPEARANCEUR CLOUDY (A) 08/23/2023 0618   LABSPEC 1.017 08/23/2023 0618   PHURINE 5.0 08/23/2023 0618   GLUCOSEU NEGATIVE 08/23/2023 0618   HGBUR NEGATIVE 08/23/2023 0618   BILIRUBINUR NEGATIVE 08/23/2023 0618   KETONESUR NEGATIVE 08/23/2023 0618   PROTEINUR 30 (A) 08/23/2023 0618   NITRITE NEGATIVE 08/23/2023 0618   LEUKOCYTESUR LARGE (A) 08/23/2023 0618   Sepsis Labs: Invalid input(s): PROCALCITONIN, LACTICIDVEN  Microbiology: No results found for this or any previous visit (from the past 240 hours).  Radiology Studies: No results found.    Meliana Canner T. Gigi Onstad [facility]  If 7PM-7AM, please contact night-coverage [email or web address] 10/10/2023, 1:14 PM

## 2023-10-10 NOTE — Procedures (Signed)
 Pre procedural Dx: Rt flank  hematoma  Post procedural Dx: Same  Drain exchange of 86fr drain on right side and Doxycycline  sclerosant.    EBL: Trace Complications: None immediate  KANDICE Banner, MD Pager #: [phone]

## 2023-10-10 NOTE — Progress Notes (Signed)
 PHARMACY - ANTICOAGULATION CONSULT NOTE  Pharmacy Consult for IV heparin  Indication: DVT  Allergies  Allergen Reactions   Morphine  And Codeine Anaphylaxis   Shellfish Allergy Anaphylaxis   Betadine  [Povidone Iodine ] Itching   Bupropion Other (See Comments)    Shaking of the body, hallucinations    Chlorhexidine  Hives    Patient reports never had issues CHG with mouth rinse.   Influenza Vaccines Hives   Metoprolol  Rash   Patient Measurements: Height: 6' (182.9 cm) Weight: 108 kg (238 lb 1.6 oz) IBW/kg (Calculated) : 77.6 HEPARIN  DW (KG): 104.1  Vital Signs: Temp: 97.5 F (36.4 C) (07/25 0721) Temp Source: Axillary (07/25 0721) BP: 133/95 (07/25 0945) Pulse Rate: 110 (07/25 0945)  Labs: Recent Labs    10/08/23 0530 10/09/23 0443 10/10/23 0420  HGB 9.5* 9.5* 10.5*  HCT 28.9* 29.3* 32.5*  PLT 481* 430* 417*  HEPARINUNFRC 0.43 0.29* 0.41   Estimated Creatinine Clearance: 81.5 mL/min (by C-G formula based on SCr of 1.24 mg/dL).  Assessment: 58 YO male with new DVT despite previous enoxaparin  50 mg daily. Patient received one dose of apixaban  10mg  on PM of 7/16; pharmacy consulted to dose heparin  given plans for L arm amputation on 7/28. Hgb and platelets stable. No issues with bleeding or heparin  infusion documented.  7/25 AM: Heparin  level therapeutic at 0.41 at 2700 units/h.   Goal of Therapy:  Heparin  level 0.3-0.7 units/ml Monitor platelets by anticoagulation protocol: Yes   Plan:  Continue infusion rate at 2700 units/hr   Daily heparin  level, CBC Monitor for s/sx of bleeding F/u to PO after surgery on 7/28  Maurilio Patten, PharmD PGY1 Pharmacy Resident [facility]  10/10/2023 9:49 AM

## 2023-10-10 NOTE — Progress Notes (Signed)
 Occupational Therapy Treatment Patient Details Name: Frank [name]. MRN: [id number] DOB: 05-Nov-1965 Today's Date: 10/10/2023   History of present illness 58 y.o. male transferred from [facility] 07/03/23 to [facility] for management of newly diagnosed plasma cell myeloma with weakness and AMS. Pt also with AKI and metabolic encephalopathy. 5/10 transfer to [facility] for iHD. 4/22 Intubated and chemo initiated. 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 5/6 IVIG started. 5/8-6/11 trach. 5/12 iHD initiated. 5/29 PEG placed. 6/5 chemo initiated. PMHx:Lt THA, carpal tunnel syndrome, Lt TKA, Lt humerus fx, PAF, HTN, HLD, bipolar disorder, obesity, T2DM   OT comments  [facility] Program OT/PT session: OTR present to update goals. Patient initially asking not to perform therapy today due to groggy from anesthesia but was willing with encouragement. Patient making good gains with OT treatment with bed mobility and sit to stands from EOB using back of recliner. Patient assisted back to bed and positioned for comfort. RUE strengthening performed while supine with level one therapy band. Patient stating he was glad he agreed to participate and felt better than earlier. OT to continue following surgery next week.       If plan is discharge home, recommend the following:  Two people to help with walking and/or transfers;Assistance with cooking/housework;Assistance with feeding;Direct supervision/assist for medications management;Direct supervision/assist for financial management;Assist for transportation;Help with stairs or ramp for entrance;A lot of help with bathing/dressing/bathroom   Equipment Recommendations  Other (comment) (TBD)    Recommendations for Other Services      Precautions / Restrictions Precautions Precautions: Fall Recall of Precautions/Restrictions: Impaired Precaution/Restrictions Comments: plan for amputation, Dr Kendal okay with arm in sling Required Braces or Orthoses: Sling Restrictions Weight Bearing  Restrictions Per Provider Order: Yes LUE Weight Bearing Per Provider Order: Non weight bearing Other Position/Activity Restrictions: Lt humerus non-union, splinted. no elbow ROM, but shoulder ROM ok per secure chat with Dr. Jonel 7/1       Mobility Bed Mobility Overal bed mobility: Needs Assistance Bed Mobility: Rolling, Supine to Sit, Sit to Supine     Supine to sit: HOB elevated, Used rails, Min assist Sit to supine: +2 for physical assistance, Total assist   General bed mobility comments: allowed increased time and pt was able to manage LE off the bed and wiggle his L hip forward and get foot to the floor ulitmately needs min A for bringing his trunk to upright, requires mod A for moving L hip all the way to the EoB mainly due to increased friction from bed pad    Transfers Overall transfer level: Needs assistance   Transfers: Sit to/from Stand, Bed to chair/wheelchair/BSC Sit to Stand: +2 physical assistance, +2 safety/equipment, Mod assist, Min assist           General transfer comment: minAx2 for 1x sit<>stand and modAx2 for sit<>stand and then modAx1 for lifting Hips to place clean linens     Balance Overall balance assessment: Needs assistance Sitting-balance support: Single extremity supported, Feet supported Sitting balance-Leahy Scale: Fair Sitting balance - Comments: EOB Postural control: Right lateral lean Standing balance support: Single extremity supported, During functional activity, Reliant on assistive device for balance Standing balance-Leahy Scale: Zero Standing balance comment: uses back of recliner to maintain standing                           ADL either performed or assessed with clinical judgement   ADL Overall ADL's : Needs assistance/impaired  Upper Body Dressing : Moderate assistance;Sitting Upper Body Dressing Details (indicate cue type and reason): total assist to donn sling and mod assist for gown Lower  Body Dressing: Supervision/safety;Sitting/lateral leans Lower Body Dressing Details (indicate cue type and reason): to donn slip on shoes                    Extremity/Trunk Assessment              Vision       Perception     Praxis     Communication Communication Communication: Impaired Factors Affecting Communication: Reduced clarity of speech   Cognition Arousal: Alert Behavior During Therapy: WFL for tasks assessed/performed Cognition: Cognition impaired       Memory impairment (select all impairments): Short-term memory   Executive functioning impairment (select all impairments): Problem solving                   Following commands: Impaired Following commands impaired: Follows one step commands with increased time      Cueing   Cueing Techniques: Verbal cues, Tactile cues, Visual cues  Exercises Exercises: General Upper Extremity General Exercises - Upper Extremity Shoulder Flexion: AROM, Right, 10 reps, Supine Shoulder Horizontal ABduction: Strengthening, Right, 10 reps, Supine, Theraband Theraband Level (Shoulder Horizontal Abduction): Level 1 (Yellow) Elbow Flexion: Strengthening, Right, 10 reps, Supine, Theraband Theraband Level (Elbow Flexion): Level 1 (Yellow) Elbow Extension: Strengthening, Right, 10 reps, Supine, Theraband Theraband Level (Elbow Extension): Level 1 (Yellow)    Shoulder Instructions       General Comments VS on RA    Pertinent Vitals/ Pain       Pain Assessment Pain Assessment: Faces Faces Pain Scale: Hurts even more Pain Location: left shoulder and Right knee when WBing Pain Descriptors / Indicators: Discomfort, Grimacing, Sharp, Moaning Pain Intervention(s): Limited activity within patient's tolerance, Monitored during session, Repositioned  Home Living                                          Prior Functioning/Environment              Frequency  Min 1X/week Environmental education officer Program)         Progress Toward Goals  OT Goals(current goals can now be found in the care plan section)  Progress towards OT goals: Progressing toward goals  Acute Rehab OT Goals Patient Stated Goal: to be able to transfer OT Goal Formulation: With patient Time For Goal Achievement: 10/24/23 Potential to Achieve Goals: Fair ADL Goals Pt Will Perform Grooming: sitting;with set-up (A/E PRN) Pt Will Perform Upper Body Dressing: sitting;with contact guard assist;with adaptive equipment Pt Will Perform Lower Body Dressing: sitting/lateral leans;sit to/from stand;with mod assist (using one handed RUE techniques) Pt Will Transfer to Toilet: with contact guard assist;bedside commode;with transfer board Pt/caregiver will Perform Home Exercise Program: Right Upper extremity;Increased strength;With written HEP provided;Independently Additional ADL Goal #1: Pt will complete bed mobility with min A as a precursor to EOB ADLs Additional ADL Goal #2: Pt will demonstrate ability to stand statically for >3 mins to demonstrate improving tolerance in prep for OOB mobility  Plan      Co-evaluation    PT/OT/SLP Co-Evaluation/Treatment: Yes Reason for Co-Treatment: For patient/therapist safety;To address functional/ADL transfers PT goals addressed during session: Mobility/safety with mobility;Balance OT goals addressed during session: ADL's and self-care      AM-PAC OT  6 Clicks Daily Activity     Outcome Measure   Help from another person eating meals?: A Little Help from another person taking care of personal grooming?: A Little Help from another person toileting, which includes using toliet, bedpan, or urinal?: Total Help from another person bathing (including washing, rinsing, drying)?: A Lot Help from another person to put on and taking off regular upper body clothing?: A Lot Help from another person to put on and taking off regular lower body clothing?: A Lot 6 Click Score: 13    End of  Session Equipment Utilized During Treatment: Gait belt;Other (comment) (sling, back of recliner)  OT Visit Diagnosis: Unsteadiness on feet (R26.81);Other abnormalities of gait and mobility (R26.89);Muscle weakness (generalized) (M62.81);Pain Pain - Right/Left: Right Pain - part of body: Knee (and left shoulder)   Activity Tolerance Patient tolerated treatment well   Patient Left in bed;with call bell/phone within reach   Nurse Communication Mobility status        Time: 1203-1259 OT Time Calculation (min): 56 min  Charges: OT General Charges $OT Visit: 1 Visit OT Treatments $Therapeutic Activity: 8-22 mins $Therapeutic Exercise: 8-22 mins  .rlo  [name] 10/10/2023, 1:46 PM

## 2023-10-10 NOTE — Progress Notes (Signed)
 Mr. Teresa continues to do pretty well.  He has more physical therapy yesterday.  He stood a little bit.  He sat in a chair for 20 minutes.  He continues on the heparin .  We will continue his heparin  until he has his surgery for the left arm amputation on Monday.  His appetite is doing well.  He has had no nausea or vomiting.  He has had no problems with bowels or bladder.  Still awaiting the results of the 24-hour urine to see how much protein is in his urine from the myeloma.  We have been holding his chemotherapy until his surgery.  He has had no fever.  He has had no cough or shortness of breath.  He does have pain with the left arm.  He cannot move the left arm.  Again his legs do not look swollen.  The heparin  certainly is helping with respect to thromboembolic disease regression.  His labs show white count 5.5.  Hemoglobin 10.5.  Platelet count 4 and 17,000.  His hemoglobin has trended up nicely.  I think this probably is secondary to the ESA that he is getting.  On his physical exam, there really is no change.  His vital signs show temperature of 98.4.  Pulse 104.  Blood pressure 153/98.  Again, I do not see a problem with him having surgery on Monday for disarticulation of the left arm.  He still needs quite a bit of physical therapy.  We will figure out when we can restart his treatment protocol for the myeloma.  I do appreciate the incredible care he is getting from everybody upon [facility].  The staff are so nice.  He is very complementary of the care that he is getting.   Jeralyn Crease, MD  Ila 7:14

## 2023-10-11 DIAGNOSIS — J181 Lobar pneumonia, unspecified organism: Secondary | ICD-10-CM | POA: Diagnosis not present

## 2023-10-11 DIAGNOSIS — R6521 Severe sepsis with septic shock: Secondary | ICD-10-CM | POA: Diagnosis not present

## 2023-10-11 DIAGNOSIS — A419 Sepsis, unspecified organism: Secondary | ICD-10-CM | POA: Diagnosis not present

## 2023-10-11 DIAGNOSIS — B449 Aspergillosis, unspecified: Secondary | ICD-10-CM | POA: Diagnosis not present

## 2023-10-11 DIAGNOSIS — C9000 Multiple myeloma not having achieved remission: Secondary | ICD-10-CM | POA: Diagnosis not present

## 2023-10-11 LAB — CBC
HCT: 29.1 % — ABNORMAL LOW (ref 39.0–52.0)
Hemoglobin: 9.3 g/dL — ABNORMAL LOW (ref 13.0–17.0)
MCH: 33.8 pg (ref 26.0–34.0)
MCHC: 32 g/dL (ref 30.0–36.0)
MCV: 105.8 fL — ABNORMAL HIGH (ref 80.0–100.0)
Platelets: 387 K/uL (ref 150–400)
RBC: 2.75 MIL/uL — ABNORMAL LOW (ref 4.22–5.81)
RDW: 18.8 % — ABNORMAL HIGH (ref 11.5–15.5)
WBC: 5.6 K/uL (ref 4.0–10.5)
nRBC: 0 % (ref 0.0–0.2)

## 2023-10-11 LAB — GLUCOSE, CAPILLARY
Glucose-Capillary: 174 mg/dL — ABNORMAL HIGH (ref 70–99)
Glucose-Capillary: 150 mg/dL — ABNORMAL HIGH (ref 70–99)
Glucose-Capillary: 109 mg/dL — ABNORMAL HIGH (ref 70–99)
Glucose-Capillary: 128 mg/dL — ABNORMAL HIGH (ref 70–99)

## 2023-10-11 LAB — HEPARIN LEVEL (UNFRACTIONATED): Heparin Unfractionated: 0.48 [IU]/mL (ref 0.30–0.70)

## 2023-10-11 MED ORDER — DIPHENHYDRAMINE HCL 25 MG PO CAPS
25.0000 mg | ORAL_CAPSULE | Freq: Once | ORAL | Status: AC | PRN
  Administered 2023-10-11: 25 mg via ORAL
  Filled 2023-10-11: qty 1

## 2023-10-11 NOTE — Progress Notes (Signed)
 PROGRESS NOTE  Frank [name]. FMW:[id number] DOB: June 07, 1965   PCP: [name], MD  Patient is from: Home.  DOA: 07/03/2023 LOS: 100  Chief complaints No chief complaint on file.    Brief Narrative / Interim history: 58 year old PMH OSA CPAP, pathological fracture and multiple ORIF left Humerus likely due to multiple myeloma, A-fib not on anticoagulation, hyperlipidemia, tobacco use disorder who was feeling weak for 1 week, admitted to [facility] with concern of sepsis, community-acquired pneumonia and acute encephalopathy. Patient was found to have lytic lesions, he was transferred to [facility] for oncology treatment. In the meantime he developed encephalopathy, acute renal failure. He remained in the ICU for 36 days. Underwent tracheostomy. He was also started on HD after CRRT,off of HD now. Subsequently PEG tube placed 5/29 and IR drain placement of right thigh hip infected hematoma on 7/11 . S/p PEG tube removal. Needs Port-A-Cath placement (for outpatient chemo), either during this hospitalization or as an outpatient. F/u IR recommendations for drain management. Started on heparin  drip on 7/17 because of b/l LE DVT. Plan for left arm amputation by [name] on 7/28.   Subjective: Seen and examined earlier this morning.  No major events overnight or this morning.  No complaints.  Objective: Vitals:   10/11/23 0330 10/11/23 0500 10/11/23 0744 10/11/23 0805  BP: 133/85 133/82  135/82  Pulse: 93 90  99  Resp: 16     Temp: 98.9 F (37.2 C)   98.7 F (37.1 C)  TempSrc: Oral   Oral  SpO2: 98% 98% 98% 97%  Weight:  107 kg    Height:        Examination:  GENERAL: No apparent distress.  Nontoxic. HEENT: MMM.  Vision and hearing grossly intact.  NECK: Supple.  No apparent JVD.  RESP:  No IWOB.  Fair aeration bilaterally. CVS:  RRR. Heart sounds normal.  ABD/GI/GU: BS+. Abd soft, NTND.  MSK/EXT:  Moves extremities. No apparent deformity. No edema.  SKIN: no apparent  skin lesion or wound NEURO: Sleepy but wakes to voice.  Oriented x 4.  BLE weakness. PSYCH: Calm. Normal affect.   Consultants:  Oncology Orthopedic surgery Infectious disease Palliative medicine   Assessment and plan: Closed fracture of left distal humerus History of Pathological fracture and multiple ORIF left Humerus 01/2023  -Plan for surgical amputation on 7/28 by [name]. -Pain fairly controlled.  Infected right flank/gluteal hematoma -Underwent IR drain placement for infected Hematoma on 7/11.  -Drain exchanged on 7/25. -Completed antibiotic course on 7/26   Sepsis in the setting of Staph Epi Bacteremia -blood culture with MRSE in 1 out of 4 bottles.  Completed a course of vancomycin  with the end date 6/20.  He did receive a line holiday.  Repeat blood culture 6/9 without growth.  Completed thereabouts on 7/26   Multiple myeloma: Hematology initiated chemotherapy on 08/21/2023. Initially given Velcade /Cytoxan . Now on Sarclisa /Kyprolis /Cytoxan .   -Needs Port-A-cath placement, likely closer to discharge or as an outpatient. -Myeloma responded well to treatment, IgG decreased from 11,507 to WNL 1470. -Continue acyclovir   -Oncology, Dr [name] following.  Next treatment likely after surgery.   AKI on CKD-3A/uremia: due to MM, hypercalcemia and ACE inhibitors?  Briefly required hemodialysis.  AKI resolved.  TDC removed.    BLE DVT: US  venous doppler on 7/16 positive for DVT in his RLE PTV, left CFV, SFJ, and PFV. -Continue heparin  infusion.  Will transition to DOAC after surgery.  Left sided pleuritic chest wall  pain:  No rib fractures seen on CXR - Analgesics as needed.   Aspergillosis with pneumonia  -Continue voriconazole .  End date on 8/5.   Acute Hypoxic Respiratory Failure-trach removed 6/11.  Decannulated.   Severe acute metabolic encephalopathy with hypoactive delirium, seizure disorder - Normal MRI of the brain.  LP negative for meningitis or encephalitis.  EEG  negative for seizures but encephalopathy.  -Resolved   IDDM-2 with hyperglycemia- A1c 6.0%. -Continue with sliding scale insulin .    Paroxysmal A-fib:: Rate controlled. -Continue home verapamil  -On IV heparin  for anticoagulation  Essential hypertension: Normotensive. - Continue home verapamil    Obstructive Sleep Apnea -Trach removed. CPAP    Anxiety/Bipolar disorder - Continue with Paxil .     Hypokalemia/hyperkalemia: Resolved.  Hyponatremia: Mild and stable.  Dysphagia: Resolved.  PEG tube removed.  On regular diet.  Severe malnutrition/class I obesity Body mass index is 31.99 kg/m. Nutrition Problem: Severe Malnutrition Etiology: acute illness (prolonged illness, interuptions in feeding) Signs/Symptoms: moderate fat depletion, moderate muscle depletion, percent weight loss (9.6% x 1 month) Percent weight loss: 9.6 % Interventions: Ensure Enlive (each supplement provides 350kcal and 20 grams of protein), MVI, Liberalize Diet, Prostat, Juven  Pressure skin injury: Pressure Injury 07/05/23 Heel Left;Right Unstageable - Full thickness tissue loss in which the base of the injury is covered by slough (yellow, tan, gray, green or brown) and/or eschar (tan, brown or black) in the wound bed. (Active)  07/05/23 1108  Location: Heel  Location Orientation: Left;Right  Staging: Unstageable - Full thickness tissue loss in which the base of the injury is covered by slough (yellow, tan, gray, green or brown) and/or eschar (tan, brown or black) in the wound bed.  Wound Description (Comments):   DO NOT USE:  Present on Admission: Yes  Dressing Type None 10/10/23 1940     Pressure Injury 07/11/23 Sacrum Mid Unstageable - Full thickness tissue loss in which the base of the injury is covered by slough (yellow, tan, gray, green or brown) and/or eschar (tan, brown or black) in the wound bed. (Active)  07/11/23 1600  Location: Sacrum  Location Orientation: Mid  Staging: Unstageable - Full  thickness tissue loss in which the base of the injury is covered by slough (yellow, tan, gray, green or brown) and/or eschar (tan, brown or black) in the wound bed.  Wound Description (Comments):   DO NOT USE:  Present on Admission: No  Dressing Type Gauze (Comment);Santyl ;Barrier Film (skin prep);Foam - Lift dressing to assess site every shift 10/07/23 1400     Pressure Injury 07/27/23 Buttocks Right;Lower (Active)  07/27/23 1700  Location: Buttocks  Location Orientation: Right;Lower  Staging:   Wound Description (Comments):   DO NOT USE:  Present on Admission: Yes  Dressing Type None 10/10/23 1940     Pressure Injury 08/25/23 Buttocks Left Stage 2 -  Partial thickness loss of dermis presenting as a shallow open injury with a red, pink wound bed without slough. (Active)  08/25/23   Location: Buttocks  Location Orientation: Left  Staging: Stage 2 -  Partial thickness loss of dermis presenting as a shallow open injury with a red, pink wound bed without slough.  Wound Description (Comments):   DO NOT USE:  Present on Admission: No  Dressing Type None 10/10/23 1940   DVT prophylaxis:  Place and maintain sequential compression device Start: 07/14/23 1033  Code Status: Full code Family Communication: None at bedside Level of care: Telemetry Medical Status is: Inpatient due to acute illness  Final disposition: SNF   35 minutes with more than 50% spent in reviewing records, counseling patient/family and coordinating care.   Sch Meds:  Scheduled Meds:  (feeding supplement) PROSource Plus  30 mL Oral TID BM   acyclovir   400 mg Oral BID   collagenase    Topical Daily   epoetin  alfa  40,000 Units Subcutaneous Q Wed-1800   feeding supplement  237 mL Oral BID WC   ferrous sulfate   325 mg Oral Q breakfast   guaiFENesin   600 mg Oral BID   hydrALAZINE   25 mg Oral Q8H   insulin  aspart  0-15 Units Subcutaneous TID AC & HS   insulin  aspart  2 Units Subcutaneous TID AC & HS   insulin   glargine-yfgn  10 Units Subcutaneous Q24H   lidocaine   1 patch Transdermal Q24H   montelukast   5 mg Oral QHS   multivitamin with minerals  1 tablet Oral Daily   nutrition supplement (JUVEN)  1 packet Oral BID AC   mouth rinse  15 mL Mouth Rinse 4 times per day   pantoprazole   40 mg Oral BID   PARoxetine   40 mg Oral Daily   polyethylene glycol  17 g Oral BID   senna  1 tablet Oral Daily   sodium chloride  flush  10-40 mL Intracatheter Q12H   umeclidinium-vilanterol  1 puff Inhalation Daily   valproic  acid  750 mg Oral BID   verapamil   120 mg Oral Daily   voriconazole   400 mg Oral Q12H   Continuous Infusions:  sodium chloride  Stopped (08/28/23 1208)   sodium chloride  Stopped (08/28/23 9160)   heparin  2,700 Units/hr (10/11/23 0614)   PRN Meds:.acetaminophen  (TYLENOL ) oral liquid 160 mg/5 mL **OR** acetaminophen , alum & mag hydroxide-simeth, benzonatate , hydrOXYzine , ipratropium-albuterol , ondansetron  (ZOFRAN ) IV, oxyCODONE , artificial tears, sodium chloride  flush  Antimicrobials: Anti-infectives (From admission, onward)    Start     Dose/Rate Route Frequency Ordered Stop   10/10/23 0900  doxycycline  200mg  in 20 mL SWI irrigation / sclerosing solution for IR       Note to Pharmacy: For irrigation only. WARNING - SCLEROSING AGENT  #5 vials sent to IR for procedure   500 mg Irrigation To Radiology 10/07/23 1418 10/10/23 1015   10/10/23 0000  doxycycline  (VIBRAMYCIN ) 500 mg in dextrose  5 % 250 mL IVPB  Status:  Discontinued        500 mg 125 mL/hr over 120 Minutes Intravenous To Radiology 10/07/23 1402 10/07/23 1409   10/10/23 0000  doxycycline  200mg  in 20 mL SWI irrigation / sclerosing solution for IR  Status:  Discontinued        200 mg Irrigation To Radiology 10/07/23 1418 10/07/23 1456   10/10/23 0000  doxycycline  200mg  in 20 mL SWI irrigation / sclerosing solution for IR  Status:  Discontinued        200 mg Irrigation To Radiology 10/07/23 1440 10/07/23 1456   10/02/23 2200   linezolid  (ZYVOX ) tablet 600 mg        600 mg Oral Every 12 hours 10/01/23 0852 10/09/23 2108   10/02/23 1000  linezolid  (ZYVOX ) tablet 600 mg  Status:  Discontinued        600 mg Oral Every 12 hours 10/01/23 0723 10/01/23 0852   09/30/23 1300  vancomycin  variable dose per unstable renal function (pharmacist dosing)  Status:  Discontinued         Does not apply See admin instructions 09/30/23 1301 10/01/23 0725   09/29/23 1145  amoxicillin -clavulanate (AUGMENTIN ) 875-125  MG per tablet 1 tablet        1 tablet Oral Every 12 hours 09/29/23 1047 10/09/23 2108   09/27/23 0200  vancomycin  (VANCOREADY) IVPB 1500 mg/300 mL        1,500 mg 150 mL/hr over 120 Minutes Intravenous Every 12 hours 09/26/23 1402 09/30/23 1448   09/26/23 1500  cefTRIAXone  (ROCEPHIN ) 2 g in sodium chloride  0.9 % 100 mL IVPB  Status:  Discontinued        2 g 200 mL/hr over 30 Minutes Intravenous Every 24 hours 09/26/23 1402 09/29/23 1047   09/26/23 1315  vancomycin  (VANCOREADY) IVPB 2000 mg/400 mL        2,000 mg 200 mL/hr over 120 Minutes Intravenous  Once 09/26/23 1220 09/26/23 1656   09/26/23 1230  metroNIDAZOLE  (FLAGYL ) IVPB 500 mg  Status:  Discontinued        500 mg 100 mL/hr over 60 Minutes Intravenous Every 12 hours 09/26/23 1137 09/29/23 1138   09/22/23 2200  voriconazole  (VFEND ) tablet 400 mg        400 mg Oral Every 12 hours 09/22/23 1521 10/21/23 2359   09/11/23 2200  acyclovir  (ZOVIRAX ) 200 MG capsule 400 mg       Note to Pharmacy: Give after dialysis the pm dose, on dialysis days   400 mg Oral 2 times daily 09/11/23 1551     09/11/23 2200  voriconazole  (VFEND ) tablet 350 mg  Status:  Discontinued        350 mg Oral Every 12 hours 09/11/23 1551 09/22/23 1521   09/07/23 1000  acyclovir  (ZOVIRAX ) 200 MG capsule 400 mg  Status:  Discontinued       Note to Pharmacy: Give after dialysis the pm dose, on dialysis days   400 mg Per Tube 2 times daily 09/07/23 0825 09/11/23 1551   09/03/23 1200  vancomycin   (VANCOCIN ) IVPB 1000 mg/200 mL premix  Status:  Discontinued        1,000 mg 200 mL/hr over 60 Minutes Intravenous Every M-W-F (Hemodialysis) 09/02/23 1021 09/03/23 0942   09/03/23 0942  vancomycin  variable dose per unstable renal function (pharmacist dosing)  Status:  Discontinued         Does not apply See admin instructions 09/03/23 0942 09/03/23 0945   09/02/23 0915  vancomycin  (VANCOCIN ) IVPB 1000 mg/200 mL premix        1,000 mg 200 mL/hr over 60 Minutes Intravenous  Once 09/02/23 0830 09/02/23 1215   09/01/23 0834  ceFAZolin  (ANCEF ) IVPB 2g/100 mL premix        over 30 Minutes Intravenous Continuous PRN 09/01/23 0834 09/01/23 0834   08/29/23 0914  vancomycin  variable dose per unstable renal function (pharmacist dosing)  Status:  Discontinued         Does not apply See admin instructions 08/29/23 0914 09/02/23 1021   08/27/23 1400  vancomycin  (VANCOCIN ) IVPB 1000 mg/200 mL premix        1,000 mg 200 mL/hr over 60 Minutes Intravenous  Once 08/27/23 0920 08/27/23 1653   08/25/23 1200  vancomycin  (VANCOCIN ) IVPB 1000 mg/200 mL premix        1,000 mg 200 mL/hr over 60 Minutes Intravenous Every M-W-F (Hemodialysis) 08/24/23 1405 08/29/23 1240   08/24/23 1445  vancomycin  (VANCOREADY) IVPB 2000 mg/400 mL        2,000 mg 200 mL/hr over 120 Minutes Intravenous  Once 08/24/23 1353 08/25/23 0323   08/14/23 0600  ceFAZolin  (ANCEF ) IVPB 2g/100 mL premix  2 g 200 mL/hr over 30 Minutes Intravenous To Radiology 08/13/23 1055 08/14/23 0606   08/09/23 1000  acyclovir  (ZOVIRAX ) 200 MG capsule 200 mg  Status:  Discontinued       Note to Pharmacy: Give after dialysis the pm dose, on dialysis days   200 mg Per Tube 2 times daily 08/08/23 2358 09/07/23 0825   08/09/23 0100  acyclovir  (ZOVIRAX ) 200 MG capsule 200 mg       Note to Pharmacy: Give after dialysis the pm dose, on dialysis days   200 mg Per Tube  Once 08/09/23 0009 08/09/23 0013   08/08/23 2345  acyclovir  (ZOVIRAX ) 200 MG capsule 200 mg   Status:  Discontinued       Note to Pharmacy: Give after dialysis the pm dose, on dialysis days   200 mg Oral 2 times daily 08/08/23 2257 08/08/23 2358   08/07/23 1530  ceFAZolin  (ANCEF ) IVPB 2g/100 mL premix        over 30 Minutes Intravenous Continuous PRN 08/07/23 1555 08/07/23 1530   08/05/23 2200  voriconazole  (VFEND ) tablet 350 mg  Status:  Discontinued        350 mg Per Tube Every 12 hours 08/05/23 1511 09/11/23 1551   08/01/23 1200  vancomycin  (VANCOCIN ) IVPB 1000 mg/200 mL premix        1,000 mg 200 mL/hr over 60 Minutes Intravenous Every M-W-F (Hemodialysis) 07/31/23 0649 08/01/23 1652   07/30/23 1200  vancomycin  (VANCOCIN ) IVPB 1000 mg/200 mL premix        1,000 mg 200 mL/hr over 60 Minutes Intravenous Every M-W-F (Hemodialysis) 07/30/23 0851 07/30/23 1241   07/29/23 0900  vancomycin  (VANCOCIN ) IVPB 1000 mg/200 mL premix        1,000 mg 200 mL/hr over 60 Minutes Intravenous  Once 07/29/23 0812 07/29/23 0943   07/28/23 1204  vancomycin  variable dose per unstable renal function (pharmacist dosing)  Status:  Discontinued         Does not apply See admin instructions 07/28/23 1204 07/31/23 0649   07/28/23 1000  voriconazole  (VFEND ) 350 mg in sodium chloride  0.9 % 100 mL IVPB  Status:  Discontinued       Placed in Followed by Linked Group   4 mg/kg  88.6 kg (Adjusted) 67.5 mL/hr over 120 Minutes Intravenous Every 12 hours 07/28/23 0748 08/05/23 1511   07/27/23 1000  voriconazole  (VFEND ) 400 mg in sodium chloride  0.9 % 100 mL IVPB  Status:  Discontinued       Placed in Followed by Linked Group   4 mg/kg  100.6 kg 70 mL/hr over 120 Minutes Intravenous Every 12 hours 07/26/23 0826 07/28/23 0748   07/27/23 0945  vancomycin  (VANCOREADY) IVPB 2000 mg/400 mL        2,000 mg 200 mL/hr over 120 Minutes Intravenous  Once 07/27/23 0849 07/27/23 1237   07/26/23 1515  Ampicillin -Sulbactam (UNASYN ) 3 g in sodium chloride  0.9 % 100 mL IVPB  Status:  Discontinued        3 g 200 mL/hr over  30 Minutes Intravenous Every 8 hours 07/26/23 1418 07/27/23 0830   07/26/23 0915  voriconazole  (VFEND ) 600 mg in sodium chloride  0.9 % 150 mL IVPB       Placed in Followed by Linked Group   6 mg/kg  100.6 kg 105 mL/hr over 120 Minutes Intravenous Every 12 hours 07/26/23 0826 07/27/23 0048   07/26/23 0900  Ampicillin -Sulbactam (UNASYN ) 3 g in sodium chloride  0.9 % 100 mL IVPB  Status:  Discontinued  3 g 200 mL/hr over 30 Minutes Intravenous Every 12 hours 07/26/23 0826 07/26/23 1418   07/23/23 1000  levofloxacin  (LEVAQUIN ) IVPB 500 mg  Status:  Discontinued       Placed in Followed by Linked Group   500 mg 100 mL/hr over 60 Minutes Intravenous Every 24 hours 07/22/23 1213 07/23/23 1001   07/23/23 1000  levofloxacin  (LEVAQUIN ) IVPB 500 mg  Status:  Discontinued       Placed in Followed by Linked Group   500 mg 100 mL/hr over 60 Minutes Intravenous Every 24 hours 07/23/23 1001 07/28/23 0804   07/22/23 1300  levofloxacin  (LEVAQUIN ) IVPB 750 mg       Placed in Followed by Linked Group   750 mg 100 mL/hr over 90 Minutes Intravenous  Once 07/22/23 1213 07/22/23 1542   07/21/23 1600  voriconazole  (VFEND ) 300 mg in sodium chloride  0.9 % 100 mL IVPB  Status:  Discontinued        300 mg 65 mL/hr over 120 Minutes Intravenous Every 12 hours 07/21/23 1430 07/22/23 1415   07/21/23 1000  voriconazole  (VFEND ) tablet 200 mg  Status:  Discontinued        200 mg Oral Every 12 hours 07/20/23 0944 07/21/23 1015   07/20/23 1030  voriconazole  (VFEND ) 530 mg in sodium chloride  0.9 % 150 mL IVPB        6 mg/kg  88.6 kg (Adjusted) 101.5 mL/hr over 120 Minutes Intravenous Every 12 hours 07/20/23 0944 07/21/23 0030   07/19/23 1400  meropenem  (MERREM ) 1 g in sodium chloride  0.9 % 100 mL IVPB  Status:  Discontinued        1 g 200 mL/hr over 30 Minutes Intravenous Every 8 hours 07/19/23 1143 07/22/23 1213   07/18/23 0600  meropenem  (MERREM ) 1 g in sodium chloride  0.9 % 100 mL IVPB  Status:   Discontinued        1 g 200 mL/hr over 30 Minutes Intravenous Every 24 hours 07/17/23 0916 07/19/23 1143   07/17/23 0700  meropenem  (MERREM ) 1 g in sodium chloride  0.9 % 100 mL IVPB  Status:  Discontinued        1 g 200 mL/hr over 30 Minutes Intravenous Every 12 hours 07/17/23 0601 07/17/23 0916   07/15/23 1300  vancomycin  (VANCOCIN ) IVPB 1000 mg/200 mL premix  Status:  Discontinued        1,000 mg 200 mL/hr over 60 Minutes Intravenous Every 24 hours 07/14/23 1129 07/22/23 1319   07/14/23 1215  vancomycin  (VANCOREADY) IVPB 1750 mg/350 mL        1,750 mg 175 mL/hr over 120 Minutes Intravenous  Once 07/14/23 1129 07/14/23 1449   07/09/23 1800  piperacillin -tazobactam (ZOSYN ) IVPB 3.375 g  Status:  Discontinued        3.375 g 12.5 mL/hr over 240 Minutes Intravenous Every 12 hours 07/09/23 0756 07/09/23 0844   07/09/23 1400  piperacillin -tazobactam (ZOSYN ) IVPB 3.375 g  Status:  Discontinued        3.375 g 12.5 mL/hr over 240 Minutes Intravenous Every 8 hours 07/09/23 0844 07/17/23 0557   07/08/23 1300  piperacillin -tazobactam (ZOSYN ) IVPB 3.375 g  Status:  Discontinued        3.375 g 12.5 mL/hr over 240 Minutes Intravenous Every 8 hours 07/08/23 1129 07/09/23 0756   07/05/23 2200  azithromycin  (ZITHROMAX ) 500 mg in sodium chloride  0.9 % 250 mL IVPB        500 mg 250 mL/hr over 60 Minutes Intravenous Daily at bedtime 07/05/23  2022 07/06/23 2302   07/05/23 2115  azithromycin  (ZITHROMAX ) 250 mg in dextrose  5 % 125 mL IVPB  Status:  Discontinued       Note to Pharmacy: Missed dose this morning   250 mg 127.5 mL/hr over 60 Minutes Intravenous Every 24 hours 07/05/23 2016 07/05/23 2021   07/04/23 0900  fluconazole  (DIFLUCAN ) IVPB 200 mg  Status:  Discontinued        200 mg 100 mL/hr over 60 Minutes Intravenous Every 48 hours 07/04/23 0722 07/09/23 0809   07/03/23 0600  cefTRIAXone  (ROCEPHIN ) 1 g in sodium chloride  0.9 % 100 mL IVPB  Status:  Discontinued        1 g 200 mL/hr over 30 Minutes  Intravenous Every 24 hours 07/03/23 0508 07/08/23 1104   07/03/23 0600  azithromycin  (ZITHROMAX ) tablet 250 mg  Status:  Discontinued        250 mg Oral Daily 07/03/23 0508 07/05/23 2016        I have personally reviewed the following labs and images: CBC: Recent Labs  Lab 10/05/23 0533 10/06/23 0614 10/07/23 0638 10/08/23 0530 10/09/23 0443 10/10/23 0420 10/11/23 0328  WBC 4.4 5.2 5.1 6.0 5.3 5.5 5.6  NEUTROABS 2.0 2.9  --   --   --   --   --   HGB 8.9* 9.4* 9.6* 9.5* 9.5* 10.5* 9.3*  HCT 27.4* 29.2* 29.1* 28.9* 29.3* 32.5* 29.1*  MCV 107.5* 107.7* 107.4* 106.6* 106.9* 107.3* 105.8*  PLT 432* 441* 464* 481* 430* 417* 387   BMP &GFR Recent Labs  Lab 10/05/23 0533 10/06/23 0614 10/07/23 0638  NA 133* 134* 132*  K 4.7 5.3* 4.6  CL 99 100 101  CO2 24 24 22   GLUCOSE 85 94 87  BUN 27* 22* 23*  CREATININE 1.03 1.01 1.24  CALCIUM  8.4* 8.6* 8.6*  MG  --   --  1.8   Estimated Creatinine Clearance: 81.1 mL/min (by C-G formula based on SCr of 1.24 mg/dL). Liver & Pancreas: Recent Labs  Lab 10/05/23 0533 10/06/23 0614 10/07/23 0638  AST 8* 10* 9*  ALT 6 7 5   ALKPHOS 84 89 83  BILITOT 0.2 0.4 0.5  PROT 5.3* 5.5* 5.5*  ALBUMIN  2.0* 2.2* 2.1*   No results for input(s): LIPASE, AMYLASE in the last 168 hours. No results for input(s): AMMONIA in the last 168 hours. Diabetic: No results for input(s): HGBA1C in the last 72 hours. Recent Labs  Lab 10/10/23 1656 10/10/23 2056 10/10/23 2306 10/11/23 0806 10/11/23 1212  GLUCAP 229* 258* 193* 174* 150*   Cardiac Enzymes: No results for input(s): CKTOTAL, CKMB, CKMBINDEX, TROPONINI in the last 168 hours. No results for input(s): PROBNP in the last 8760 hours. Coagulation Profile: No results for input(s): INR, PROTIME in the last 168 hours. Thyroid Function Tests: No results for input(s): TSH, T4TOTAL, FREET4, T3FREE, THYROIDAB in the last 72 hours. Lipid Profile: No results for  input(s): CHOL, HDL, LDLCALC, TRIG, CHOLHDL, LDLDIRECT in the last 72 hours. Anemia Panel: No results for input(s): VITAMINB12, FOLATE, FERRITIN, TIBC, IRON, RETICCTPCT in the last 72 hours. Urine analysis:    Component Value Date/Time   COLORURINE AMBER (A) 08/23/2023 0618   APPEARANCEUR CLOUDY (A) 08/23/2023 0618   LABSPEC 1.017 08/23/2023 0618   PHURINE 5.0 08/23/2023 0618   GLUCOSEU NEGATIVE 08/23/2023 0618   HGBUR NEGATIVE 08/23/2023 0618   BILIRUBINUR NEGATIVE 08/23/2023 0618   KETONESUR NEGATIVE 08/23/2023 0618   PROTEINUR 30 (A) 08/23/2023 0618   NITRITE NEGATIVE 08/23/2023  9381   LEUKOCYTESUR LARGE (A) 08/23/2023 0618   Sepsis Labs: Invalid input(s): PROCALCITONIN, LACTICIDVEN  Microbiology: No results found for this or any previous visit (from the past 240 hours).  Radiology Studies: No results found.    Krystyne Tewksbury T. Jayonna Meyering [facility]  If 7PM-7AM, please contact night-coverage [email or web address] 10/11/2023, 2:18 PM

## 2023-10-11 NOTE — Plan of Care (Signed)
  Problem: Education: Goal: Knowledge of General Education information will improve Description: Including pain rating scale, medication(s)/side effects and non-pharmacologic comfort measures Outcome: Progressing   Problem: Health Behavior/Discharge Planning: Goal: Ability to manage health-related needs will improve Outcome: Progressing   Problem: Clinical Measurements: Goal: Ability to maintain clinical measurements within normal limits will improve Outcome: Progressing Goal: Will remain free from infection Outcome: Progressing Goal: Diagnostic test results will improve Outcome: Progressing Goal: Respiratory complications will improve Outcome: Progressing Goal: Cardiovascular complication will be avoided Outcome: Progressing   Problem: Activity: Goal: Risk for activity intolerance will decrease Outcome: Progressing   Problem: Nutrition: Goal: Adequate nutrition will be maintained Outcome: Progressing   Problem: Coping: Goal: Level of anxiety will decrease Outcome: Progressing   Problem: Elimination: Goal: Will not experience complications related to bowel motility Outcome: Progressing Goal: Will not experience complications related to urinary retention Outcome: Progressing   Problem: Pain Managment: Goal: General experience of comfort will improve and/or be controlled Outcome: Progressing   Problem: Safety: Goal: Ability to remain free from injury will improve Outcome: Progressing   Problem: Skin Integrity: Goal: Risk for impaired skin integrity will decrease Outcome: Progressing   Problem: Education: Goal: Ability to describe self-care measures that may prevent or decrease complications (Diabetes Survival Skills Education) will improve Outcome: Progressing Goal: Individualized Educational Video(s) Outcome: Progressing   Problem: Coping: Goal: Ability to adjust to condition or change in health will improve Outcome: Progressing   Problem: Fluid  Volume: Goal: Ability to maintain a balanced intake and output will improve Outcome: Progressing   Problem: Health Behavior/Discharge Planning: Goal: Ability to identify and utilize available resources and services will improve Outcome: Progressing Goal: Ability to manage health-related needs will improve Outcome: Progressing   Problem: Metabolic: Goal: Ability to maintain appropriate glucose levels will improve Outcome: Progressing   Problem: Nutritional: Goal: Maintenance of adequate nutrition will improve Outcome: Progressing Goal: Progress toward achieving an optimal weight will improve Outcome: Progressing   Problem: Skin Integrity: Goal: Risk for impaired skin integrity will decrease Outcome: Progressing   Problem: Tissue Perfusion: Goal: Adequacy of tissue perfusion will improve Outcome: Progressing   Problem: Education: Goal: Knowledge about tracheostomy care/management will improve Outcome: Progressing   Problem: Activity: Goal: Ability to tolerate increased activity will improve Outcome: Progressing   Problem: Health Behavior/Discharge Planning: Goal: Ability to manage tracheostomy will improve Outcome: Progressing   Problem: Respiratory: Goal: Patent airway maintenance will improve Outcome: Progressing   Problem: Role Relationship: Goal: Ability to communicate will improve Outcome: Progressing   Problem: Education: Goal: Knowledge of disease and its progression will improve Outcome: Progressing Goal: Individualized Educational Video(s) Outcome: Progressing   Problem: Fluid Volume: Goal: Compliance with measures to maintain balanced fluid volume will improve Outcome: Progressing   Problem: Health Behavior/Discharge Planning: Goal: Ability to manage health-related needs will improve Outcome: Progressing   Problem: Nutritional: Goal: Ability to make healthy dietary choices will improve Outcome: Progressing

## 2023-10-11 NOTE — Progress Notes (Signed)
 Frank [name] is doing all right.  IR took him down yesterday and exchanged the drainage catheter in the right hip.  The catheter is still on.  Hopefully, this will be able to come out at some point.  He says he has a little numbness in the right thigh.  He is ready for surgery on Monday.  His blood counts should be okay.  Today, his white count is 5.6.  Hemoglobin 9.3.  Platelet count 387,000.  His 24-hour urine came back.  He had a very nice response to treatment so far.  His kappa light chain went from 355 mg/mL down to 24 mg/L.  He still has a monoclonal spike in the urine.  His monoclonal spike was 46 mg/day.  At some point, we will have to get him back on treatment.  Again, it would be really helpful to have a Port-A-Cath put in him.  I think a Port-A-Cath would be safe to put in.  I know IR is worried about this drainage catheter.  Cultures have been all negative.  He is eating well.  He has had no nausea or vomiting.  He has had no cough or shortness of breath.  He has had no problems with bleeding.  There has been no issues with bowels or bladder.  PT and OT are doing a great job with him.  His vital signs are all stable.  Temperature 98.9.  Pulse 90.  Blood pressure 133/82.  His lungs are clear bilaterally.  Cardiac exam regular rate and rhythm.  Abdomen is soft.  Bowel sounds are present.  He has no fluid wave.  He has no guarding or rebound tenderness.  Extremities shows minimal edema in the lower legs.  He has good range of motion of the left leg.  There is some decreased range of motion of the right leg.  Neurological exam shows no focal deficits.  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  His response has been very nice with treatment to date.  We are awaiting the disarticulation of the left arm.  I really do not think there should be any problems with bleeding.  His nutritional state continues to improve.  This will certainly help healing of the arm after surgery.  I am not sure one  of the drainage catheter will come out of the right thigh.  This will certainly be a plus for him when that happens.  I do appreciate the great care that he is getting from everybody on [facility].  They are doing a wonderful job.   Jeralyn Crease, MD  Inge 17:14

## 2023-10-11 NOTE — Plan of Care (Signed)
  Problem: Education: Goal: Knowledge of General Education information will improve Description: Including pain rating scale, medication(s)/side effects and non-pharmacologic comfort measures Outcome: Progressing   Problem: Health Behavior/Discharge Planning: Goal: Ability to manage health-related needs will improve Outcome: Progressing   Problem: Clinical Measurements: Goal: Ability to maintain clinical measurements within normal limits will improve Outcome: Progressing Goal: Will remain free from infection Outcome: Progressing Goal: Diagnostic test results will improve Outcome: Progressing Goal: Respiratory complications will improve Outcome: Progressing Goal: Cardiovascular complication will be avoided Outcome: Progressing   Problem: Pain Managment: Goal: General experience of comfort will improve and/or be controlled Outcome: Progressing   Problem: Elimination: Goal: Will not experience complications related to bowel motility Outcome: Progressing Goal: Will not experience complications related to urinary retention Outcome: Progressing   Problem: Coping: Goal: Level of anxiety will decrease Outcome: Progressing   Problem: Nutrition: Goal: Adequate nutrition will be maintained Outcome: Progressing   Problem: Activity: Goal: Risk for activity intolerance will decrease Outcome: Progressing

## 2023-10-11 NOTE — Progress Notes (Signed)
 PHARMACY - ANTICOAGULATION CONSULT NOTE  Pharmacy Consult for IV heparin  Indication: DVT  Allergies  Allergen Reactions   Morphine  And Codeine Anaphylaxis   Shellfish Allergy Anaphylaxis   Betadine  [Povidone Iodine ] Itching   Bupropion Other (See Comments)    Shaking of the body, hallucinations    Chlorhexidine  Hives    Patient reports never had issues CHG with mouth rinse.   Influenza Vaccines Hives   Metoprolol  Rash   Patient Measurements: Height: 6' (182.9 cm) Weight: 107 kg (235 lb 14.3 oz) IBW/kg (Calculated) : 77.6 HEPARIN  DW (KG): 104.1  Vital Signs: Temp: 98.9 F (37.2 C) (07/26 0330) Temp Source: Oral (07/26 0330) BP: 133/82 (07/26 0500) Pulse Rate: 90 (07/26 0500)  Labs: Recent Labs    10/09/23 0443 10/10/23 0420 10/11/23 0328  HGB 9.5* 10.5* 9.3*  HCT 29.3* 32.5* 29.1*  PLT 430* 417* 387  HEPARINUNFRC 0.29* 0.41 0.48   Estimated Creatinine Clearance: 81.1 mL/min (by C-G formula based on SCr of 1.24 mg/dL).  Assessment: 58 YO male with new DVT despite previous enoxaparin  50 mg daily. Patient received one dose of apixaban  10mg  on PM of 7/16; pharmacy consulted to dose heparin  given plans for L arm amputation on 7/28. Hgb and platelets stable. No issues with bleeding or heparin  infusion documented.  7/26 AM: Heparin  level therapeutic at 0.48 at 2700 units/h.   Goal of Therapy:  Heparin  level 0.3-0.7 units/ml Monitor platelets by anticoagulation protocol: Yes   Plan:  Continue infusion rate at 2700 units/hr   Daily heparin  level, CBC Monitor for s/sx of bleeding F/u switch to PO after surgery on 7/28  Donny Alert, PharmD, [facility] Clinical Pharmacist Please see AMION for all Pharmacists' Contact Phone Numbers 10/11/2023, 7:11 AM

## 2023-10-12 DIAGNOSIS — A419 Sepsis, unspecified organism: Secondary | ICD-10-CM | POA: Diagnosis not present

## 2023-10-12 DIAGNOSIS — J181 Lobar pneumonia, unspecified organism: Secondary | ICD-10-CM | POA: Diagnosis not present

## 2023-10-12 DIAGNOSIS — B449 Aspergillosis, unspecified: Secondary | ICD-10-CM | POA: Diagnosis not present

## 2023-10-12 DIAGNOSIS — C9000 Multiple myeloma not having achieved remission: Secondary | ICD-10-CM | POA: Diagnosis not present

## 2023-10-12 LAB — GLUCOSE, CAPILLARY
Glucose-Capillary: 164 mg/dL — ABNORMAL HIGH (ref 70–99)
Glucose-Capillary: 155 mg/dL — ABNORMAL HIGH (ref 70–99)
Glucose-Capillary: 125 mg/dL — ABNORMAL HIGH (ref 70–99)
Glucose-Capillary: 164 mg/dL — ABNORMAL HIGH (ref 70–99)

## 2023-10-12 LAB — RENAL FUNCTION PANEL
Albumin: 2.3 g/dL — ABNORMAL LOW (ref 3.5–5.0)
Anion gap: 10 (ref 5–15)
BUN: 24 mg/dL — ABNORMAL HIGH (ref 6–20)
CO2: 20 mmol/L — ABNORMAL LOW (ref 22–32)
Calcium: 8.3 mg/dL — ABNORMAL LOW (ref 8.9–10.3)
Chloride: 101 mmol/L (ref 98–111)
Creatinine, Ser: 1.4 mg/dL — ABNORMAL HIGH (ref 0.61–1.24)
GFR, Estimated: 58 mL/min — ABNORMAL LOW (ref >60)
Glucose, Bld: 126 mg/dL — ABNORMAL HIGH (ref 70–99)
Phosphorus: 3.6 mg/dL (ref 2.5–4.6)
Potassium: 5.1 mmol/L (ref 3.5–5.1)
Sodium: 131 mmol/L — ABNORMAL LOW (ref 135–145)

## 2023-10-12 LAB — CBC
HCT: 30.8 % — ABNORMAL LOW (ref 39.0–52.0)
Hemoglobin: 9.9 g/dL — ABNORMAL LOW (ref 13.0–17.0)
MCH: 34.1 pg — ABNORMAL HIGH (ref 26.0–34.0)
MCHC: 32.1 g/dL (ref 30.0–36.0)
MCV: 106.2 fL — ABNORMAL HIGH (ref 80.0–100.0)
Platelets: 378 K/uL (ref 150–400)
RBC: 2.9 MIL/uL — ABNORMAL LOW (ref 4.22–5.81)
RDW: 18.8 % — ABNORMAL HIGH (ref 11.5–15.5)
WBC: 6.1 K/uL (ref 4.0–10.5)
nRBC: 0 % (ref 0.0–0.2)

## 2023-10-12 LAB — HEPARIN LEVEL (UNFRACTIONATED): Heparin Unfractionated: 0.38 [IU]/mL (ref 0.30–0.70)

## 2023-10-12 LAB — MAGNESIUM: Magnesium: 1.8 mg/dL (ref 1.7–2.4)

## 2023-10-12 NOTE — Anesthesia Preprocedure Evaluation (Signed)
 Anesthesia Evaluation  Patient identified by MRN, date of birth, ID band Patient awake    Reviewed: Allergy & Precautions, NPO status , Patient's Chart, lab work & pertinent test results  Airway Mallampati: III  TM Distance: >3 FB Neck ROM: Full    Dental no notable dental hx. (+) Missing, Dental Advisory Given,    Pulmonary shortness of breath, sleep apnea and Continuous Positive Airway Pressure Ventilation , pneumonia, Current Smoker and Patient abstained from smoking., PE   Pulmonary exam normal breath sounds clear to auscultation       Cardiovascular hypertension, Pt. on medications (-) angina + DVT  Normal cardiovascular exam+ dysrhythmias Atrial Fibrillation  Rhythm:Regular Rate:Normal     Neuro/Psych Well Controlled,  PSYCHIATRIC DISORDERS Anxiety Depression Bipolar Disorder      GI/Hepatic   Endo/Other  diabetes, Type 2, Oral Hypoglycemic Agents    Renal/GU Renal diseaseLab Results      Component                Value               Date                            K                        5.1                 10/12/2023                CO2                      20 (L)              10/12/2023                BUN                      24 (H)              10/12/2023                CREATININE               1.40 (H)            10/12/2023                GFRNONAA                 58 (L)              10/12/2023                CALCIUM                   8.3 (L)             10/12/2023                PHOS                     3.6                 10/12/2023                ALBUMIN                   2.3 (L)  10/12/2023                     Musculoskeletal  (+) Arthritis , Osteoarthritis,    Abdominal  (+) Obesity: BMI 34.6.  Peds  Hematology Multiple myeloma  Lab Results      Component                Value               Date                      WBC                      6.1                 10/12/2023                HGB                       9.9 (L)             10/12/2023                HCT                      30.8 (L)            10/12/2023                MCV                      106.2 (H)           10/12/2023                PLT                      378                 10/12/2023              Anesthesia Other Findings All: see list  Reproductive/Obstetrics                              Anesthesia Physical Anesthesia Plan  ASA: 3  Anesthesia Plan: General and Regional   Post-op Pain Management: Ketamine  IV*, Regional block* and Minimal or no pain anticipated   Induction: Intravenous  PONV Risk Score and Plan: 2 and Treatment may vary due to age or medical condition, Midazolam , Ondansetron  and Dexamethasone   Airway Management Planned: Oral ETT  Additional Equipment: None  Intra-op Plan:   Post-operative Plan: Extubation in OR  Informed Consent: I have reviewed the patients History and Physical, chart, labs and discussed the procedure including the risks, benefits and alternatives for the proposed anesthesia with the patient or authorized representative who has indicated his/her understanding and acceptance.     Dental advisory given  Plan Discussed with: CRNA and Anesthesiologist  Anesthesia Plan Comments: (  GA + interscalene block  )        Anesthesia Quick Evaluation

## 2023-10-12 NOTE — Progress Notes (Signed)
 PHARMACY - ANTICOAGULATION CONSULT NOTE  Pharmacy Consult for IV heparin  Indication: DVT  Allergies  Allergen Reactions   Morphine  And Codeine Anaphylaxis   Shellfish Allergy Anaphylaxis   Betadine  [Povidone Iodine ] Itching   Bupropion Other (See Comments)    Shaking of the body, hallucinations    Chlorhexidine  Hives    Patient reports never had issues CHG with mouth rinse.   Influenza Vaccines Hives   Metoprolol  Rash   Patient Measurements: Height: 6' (182.9 cm) Weight: 107 kg (235 lb 14.3 oz) IBW/kg (Calculated) : 77.6 HEPARIN  DW (KG): 104.1  Vital Signs: Temp: 98.5 F (36.9 C) (07/27 0415) Temp Source: Oral (07/27 0415) BP: 135/80 (07/27 0415) Pulse Rate: 97 (07/27 0415)  Labs: Recent Labs    10/10/23 0420 10/11/23 0328 10/12/23 0533  HGB 10.5* 9.3* 9.9*  HCT 32.5* 29.1* 30.8*  PLT 417* 387 378  HEPARINUNFRC 0.41 0.48 0.38  CREATININE  --   --  1.40*   Estimated Creatinine Clearance: 71.8 mL/min (A) (by C-G formula based on SCr of 1.4 mg/dL (H)).  Assessment: 58 YO male with new DVT despite previous enoxaparin  50 mg daily. Patient received one dose of apixaban  10mg  on PM of 7/16; pharmacy consulted to dose heparin  given plans for L arm amputation on 7/28. Hgb and platelets stable. No issues with bleeding or heparin  infusion documented.  7/27 AM: Heparin  level therapeutic at 0.38 at 2700 units/h.   Goal of Therapy:  Heparin  level 0.3-0.7 units/ml Monitor platelets by anticoagulation protocol: Yes   Plan:  Continue infusion rate at 2700 units/hr   Daily heparin  level, CBC Monitor for s/sx of bleeding F/u switch to PO after surgery on 7/28  Donny Alert, PharmD, [facility] Clinical Pharmacist Please see AMION for all Pharmacists' Contact Phone Numbers 10/12/2023, 7:10 AM

## 2023-10-12 NOTE — Progress Notes (Signed)
 2200 Patient informed/reminded to Keep NPO starting at Midnight for OR in AM.  Understanding verbalized.

## 2023-10-12 NOTE — Progress Notes (Signed)
 PROGRESS NOTE  Frank [name]. FMW:[id number] DOB: [birth year]   PCP: [name], MD  Patient is from: Home.  DOA: 07/03/2023 LOS: 101  Chief complaints No chief complaint on file.    Brief Narrative / Interim history: 58 year old PMH OSA CPAP, pathological fracture and multiple ORIF left Humerus likely due to multiple myeloma, A-fib not on anticoagulation, hyperlipidemia, tobacco use disorder who was feeling weak for 1 week, admitted to [facility] health with concern of sepsis, community-acquired pneumonia and acute encephalopathy. Patient was found to have lytic lesions, he was transferred to [facility] for oncology treatment. In the meantime he developed encephalopathy, acute renal failure. He remained in the ICU for 36 days. Underwent tracheostomy. He was also started on HD after CRRT,off of HD now. Subsequently PEG tube placed 5/29 and IR drain placement of right thigh hip infected hematoma on 7/11 . S/p PEG tube removal. Needs Port-A-Cath placement (for outpatient chemo), either during this hospitalization or as an outpatient. F/u IR recommendations for drain management. Started on heparin  drip on 7/17 because of b/l LE DVT. Plan for left arm amputation by [name] on 7/28.   Subjective: Seen and examined earlier this morning.  No major events overnight or this morning.  No complaints.  Objective: Vitals:   10/11/23 1925 10/12/23 0100 10/12/23 0415 10/12/23 0432  BP: 133/86  135/80   Pulse: 99  97   Resp: 16  16   Temp: 98.9 F (37.2 C)  98.5 F (36.9 C)   TempSrc: Oral  Oral   SpO2: 99% 99% 99%   Weight:    107 kg  Height:        Examination:  GENERAL: No apparent distress.  Nontoxic. HEENT: MMM.  Vision and hearing grossly intact.  NECK: Supple.  No apparent JVD.  RESP:  No IWOB.  Fair aeration bilaterally. CVS:  RRR. Heart sounds normal.  ABD/GI/GU: BS+. Abd soft, NTND.  MSK/EXT:  Moves extremities. No apparent deformity. No edema.  SKIN: no apparent skin  lesion or wound NEURO: Sleepy but wakes to voice.  Oriented x 4.  BLE weakness. PSYCH: Calm. Normal affect.   Consultants:  Oncology Orthopedic surgery Infectious disease Palliative medicine   Assessment and plan: Closed fracture of left distal humerus History of Pathological fracture and multiple ORIF left Humerus 01/2023  -Plan for surgical amputation on 7/28 by [name]. -Pain fairly controlled.  Infected right flank/gluteal hematoma -Underwent IR drain placement for infected Hematoma on 7/11.  -Drain exchanged on 7/25. -Completed antibiotic course on 7/26   Sepsis in the setting of Staph Epi Bacteremia -blood culture with MRSE in 1 out of 4 bottles.  Completed a course of vancomycin  with the end date 6/20.  He did receive a line holiday.  Repeat blood culture 6/9 without growth.  Completed thereabouts on 7/26   Multiple myeloma: Hematology initiated chemotherapy on 08/21/2023. Initially given Velcade /Cytoxan . Now on Sarclisa /Kyprolis /Cytoxan .   -Needs Port-A-cath placement, likely closer to discharge or as an outpatient. -Myeloma responded well to treatment, IgG decreased from 11,507 to WNL 1470. -Continue acyclovir   -Oncology, Dr [name] following.  Next treatment likely after surgery.   AKI on CKD-3A/uremia: due to MM, hypercalcemia and ACE inhibitors?  Briefly required hemodialysis.  AKI resolved and TDC removed.  Cr  slightly up today. - Continue monitoring  BLE DVT: US  venous doppler on 7/16 positive for DVT in his RLE PTV, left CFV, SFJ, and PFV. -Continue heparin  infusion.  Will transition to DOAC after  surgery.  Left sided pleuritic chest wall pain:  No rib fractures seen on CXR - Analgesics as needed.   Aspergillosis with pneumonia  -Continue voriconazole .  End date on 8/5.   Acute Hypoxic Respiratory Failure-trach removed 6/11.  Decannulated.   Severe acute metabolic encephalopathy with hypoactive delirium, seizure disorder - Normal MRI of the brain.  LP  negative for meningitis or encephalitis.  EEG negative for seizures but encephalopathy.  -Resolved   IDDM-2 with hyperglycemia- A1c 6.0%. -Continue with sliding scale insulin .    Paroxysmal A-fib:: Rate controlled. -Continue home verapamil  -On IV heparin  for anticoagulation  Essential hypertension: Normotensive. - Continue home verapamil    Obstructive Sleep Apnea -Trach removed. CPAP    Anxiety/Bipolar disorder - Continue with Paxil .     Hypokalemia/hyperkalemia: Resolved.  Hyponatremia: Mild and stable.  Dysphagia: Resolved.  PEG tube removed.  On regular diet.  Severe malnutrition/class I obesity Body mass index is 31.99 kg/m. Nutrition Problem: Severe Malnutrition Etiology: acute illness (prolonged illness, interuptions in feeding) Signs/Symptoms: moderate fat depletion, moderate muscle depletion, percent weight loss (9.6% x 1 month) Percent weight loss: 9.6 % Interventions: Ensure Enlive (each supplement provides 350kcal and 20 grams of protein), MVI, Liberalize Diet, Prostat, Juven  Pressure skin injury: Pressure Injury 07/05/23 Heel Left;Right Unstageable - Full thickness tissue loss in which the base of the injury is covered by slough (yellow, tan, gray, green or brown) and/or eschar (tan, brown or black) in the wound bed. (Active)  07/05/23 1108  Location: Heel  Location Orientation: Left;Right  Staging: Unstageable - Full thickness tissue loss in which the base of the injury is covered by slough (yellow, tan, gray, green or brown) and/or eschar (tan, brown or black) in the wound bed.  Wound Description (Comments):   DO NOT USE:  Present on Admission: Yes  Dressing Type None 10/11/23 1912     Pressure Injury 07/11/23 Sacrum Mid Unstageable - Full thickness tissue loss in which the base of the injury is covered by slough (yellow, tan, gray, green or brown) and/or eschar (tan, brown or black) in the wound bed. (Active)  07/11/23 1600  Location: Sacrum  Location  Orientation: Mid  Staging: Unstageable - Full thickness tissue loss in which the base of the injury is covered by slough (yellow, tan, gray, green or brown) and/or eschar (tan, brown or black) in the wound bed.  Wound Description (Comments):   DO NOT USE:  Present on Admission: No  Dressing Type Gauze (Comment);Santyl ;Barrier Film (skin prep);Foam - Lift dressing to assess site every shift 10/07/23 1400     Pressure Injury 07/27/23 Buttocks Right;Lower (Active)  07/27/23 1700  Location: Buttocks  Location Orientation: Right;Lower  Staging:   Wound Description (Comments):   DO NOT USE:  Present on Admission: Yes  Dressing Type None 10/11/23 1912     Pressure Injury 08/25/23 Buttocks Left Stage 2 -  Partial thickness loss of dermis presenting as a shallow open injury with a red, pink wound bed without slough. (Active)  08/25/23   Location: Buttocks  Location Orientation: Left  Staging: Stage 2 -  Partial thickness loss of dermis presenting as a shallow open injury with a red, pink wound bed without slough.  Wound Description (Comments):   DO NOT USE:  Present on Admission: No  Dressing Type None 10/11/23 1912   DVT prophylaxis:  Place and maintain sequential compression device Start: 07/14/23 1033  Code Status: Full code Family Communication: None at bedside Level of care: Telemetry Medical Status is:  Inpatient due to acute illness   Final disposition: SNF   35 minutes with more than 50% spent in reviewing records, counseling patient/family and coordinating care.   Sch Meds:  Scheduled Meds:  (feeding supplement) PROSource Plus  30 mL Oral TID BM   acyclovir   400 mg Oral BID   collagenase    Topical Daily   epoetin  alfa  40,000 Units Subcutaneous Q Wed-1800   feeding supplement  237 mL Oral BID WC   ferrous sulfate   325 mg Oral Q breakfast   guaiFENesin   600 mg Oral BID   hydrALAZINE   25 mg Oral Q8H   insulin  aspart  0-15 Units Subcutaneous TID AC & HS   insulin  aspart   2 Units Subcutaneous TID AC & HS   insulin  glargine-yfgn  10 Units Subcutaneous Q24H   lidocaine   1 patch Transdermal Q24H   montelukast   5 mg Oral QHS   multivitamin with minerals  1 tablet Oral Daily   nutrition supplement (JUVEN)  1 packet Oral BID AC   mouth rinse  15 mL Mouth Rinse 4 times per day   pantoprazole   40 mg Oral BID   PARoxetine   40 mg Oral Daily   polyethylene glycol  17 g Oral BID   senna  1 tablet Oral Daily   sodium chloride  flush  10-40 mL Intracatheter Q12H   umeclidinium-vilanterol  1 puff Inhalation Daily   valproic  acid  750 mg Oral BID   verapamil   120 mg Oral Daily   voriconazole   400 mg Oral Q12H   Continuous Infusions:  sodium chloride  Stopped (08/28/23 1208)   sodium chloride  Stopped (08/28/23 0839)   heparin  2,700 Units/hr (10/12/23 0110)   PRN Meds:.acetaminophen  (TYLENOL ) oral liquid 160 mg/5 mL **OR** acetaminophen , alum & mag hydroxide-simeth, benzonatate , hydrOXYzine , ipratropium-albuterol , ondansetron  (ZOFRAN ) IV, oxyCODONE , artificial tears, sodium chloride  flush  Antimicrobials: Anti-infectives (From admission, onward)    Start     Dose/Rate Route Frequency Ordered Stop   10/10/23 0900  doxycycline  200mg  in 20 mL SWI irrigation / sclerosing solution for IR       Note to Pharmacy: For irrigation only. WARNING - SCLEROSING AGENT  #5 vials sent to IR for procedure   500 mg Irrigation To Radiology 10/07/23 1418 10/10/23 1015   10/10/23 0000  doxycycline  (VIBRAMYCIN ) 500 mg in dextrose  5 % 250 mL IVPB  Status:  Discontinued        500 mg 125 mL/hr over 120 Minutes Intravenous To Radiology 10/07/23 1402 10/07/23 1409   10/10/23 0000  doxycycline  200mg  in 20 mL SWI irrigation / sclerosing solution for IR  Status:  Discontinued        200 mg Irrigation To Radiology 10/07/23 1418 10/07/23 1456   10/10/23 0000  doxycycline  200mg  in 20 mL SWI irrigation / sclerosing solution for IR  Status:  Discontinued        200 mg Irrigation To Radiology  10/07/23 1440 10/07/23 1456   10/02/23 2200  linezolid  (ZYVOX ) tablet 600 mg        600 mg Oral Every 12 hours 10/01/23 0852 10/09/23 2108   10/02/23 1000  linezolid  (ZYVOX ) tablet 600 mg  Status:  Discontinued        600 mg Oral Every 12 hours 10/01/23 0723 10/01/23 0852   09/30/23 1300  vancomycin  variable dose per unstable renal function (pharmacist dosing)  Status:  Discontinued         Does not apply See admin instructions 09/30/23 1301 10/01/23 0725  09/29/23 1145  amoxicillin -clavulanate (AUGMENTIN ) 875-125 MG per tablet 1 tablet        1 tablet Oral Every 12 hours 09/29/23 1047 10/09/23 2108   09/27/23 0200  vancomycin  (VANCOREADY) IVPB 1500 mg/300 mL        1,500 mg 150 mL/hr over 120 Minutes Intravenous Every 12 hours 09/26/23 1402 09/30/23 1448   09/26/23 1500  cefTRIAXone  (ROCEPHIN ) 2 g in sodium chloride  0.9 % 100 mL IVPB  Status:  Discontinued        2 g 200 mL/hr over 30 Minutes Intravenous Every 24 hours 09/26/23 1402 09/29/23 1047   09/26/23 1315  vancomycin  (VANCOREADY) IVPB 2000 mg/400 mL        2,000 mg 200 mL/hr over 120 Minutes Intravenous  Once 09/26/23 1220 09/26/23 1656   09/26/23 1230  metroNIDAZOLE  (FLAGYL ) IVPB 500 mg  Status:  Discontinued        500 mg 100 mL/hr over 60 Minutes Intravenous Every 12 hours 09/26/23 1137 09/29/23 1138   09/22/23 2200  voriconazole  (VFEND ) tablet 400 mg        400 mg Oral Every 12 hours 09/22/23 1521 10/21/23 2359   09/11/23 2200  acyclovir  (ZOVIRAX ) 200 MG capsule 400 mg       Note to Pharmacy: Give after dialysis the pm dose, on dialysis days   400 mg Oral 2 times daily 09/11/23 1551     09/11/23 2200  voriconazole  (VFEND ) tablet 350 mg  Status:  Discontinued        350 mg Oral Every 12 hours 09/11/23 1551 09/22/23 1521   09/07/23 1000  acyclovir  (ZOVIRAX ) 200 MG capsule 400 mg  Status:  Discontinued       Note to Pharmacy: Give after dialysis the pm dose, on dialysis days   400 mg Per Tube 2 times daily 09/07/23 0825  09/11/23 1551   09/03/23 1200  vancomycin  (VANCOCIN ) IVPB 1000 mg/200 mL premix  Status:  Discontinued        1,000 mg 200 mL/hr over 60 Minutes Intravenous Every M-W-F (Hemodialysis) 09/02/23 1021 09/03/23 0942   09/03/23 0942  vancomycin  variable dose per unstable renal function (pharmacist dosing)  Status:  Discontinued         Does not apply See admin instructions 09/03/23 0942 09/03/23 0945   09/02/23 0915  vancomycin  (VANCOCIN ) IVPB 1000 mg/200 mL premix        1,000 mg 200 mL/hr over 60 Minutes Intravenous  Once 09/02/23 0830 09/02/23 1215   09/01/23 0834  ceFAZolin  (ANCEF ) IVPB 2g/100 mL premix        over 30 Minutes Intravenous Continuous PRN 09/01/23 0834 09/01/23 0834   08/29/23 0914  vancomycin  variable dose per unstable renal function (pharmacist dosing)  Status:  Discontinued         Does not apply See admin instructions 08/29/23 0914 09/02/23 1021   08/27/23 1400  vancomycin  (VANCOCIN ) IVPB 1000 mg/200 mL premix        1,000 mg 200 mL/hr over 60 Minutes Intravenous  Once 08/27/23 0920 08/27/23 1653   08/25/23 1200  vancomycin  (VANCOCIN ) IVPB 1000 mg/200 mL premix        1,000 mg 200 mL/hr over 60 Minutes Intravenous Every M-W-F (Hemodialysis) 08/24/23 1405 08/29/23 1240   08/24/23 1445  vancomycin  (VANCOREADY) IVPB 2000 mg/400 mL        2,000 mg 200 mL/hr over 120 Minutes Intravenous  Once 08/24/23 1353 08/25/23 0323   08/14/23 0600  ceFAZolin  (ANCEF ) IVPB 2g/100 mL  premix        2 g 200 mL/hr over 30 Minutes Intravenous To Radiology 08/13/23 1055 08/14/23 0606   08/09/23 1000  acyclovir  (ZOVIRAX ) 200 MG capsule 200 mg  Status:  Discontinued       Note to Pharmacy: Give after dialysis the pm dose, on dialysis days   200 mg Per Tube 2 times daily 08/08/23 2358 09/07/23 0825   08/09/23 0100  acyclovir  (ZOVIRAX ) 200 MG capsule 200 mg       Note to Pharmacy: Give after dialysis the pm dose, on dialysis days   200 mg Per Tube  Once 08/09/23 0009 08/09/23 0013   08/08/23 2345   acyclovir  (ZOVIRAX ) 200 MG capsule 200 mg  Status:  Discontinued       Note to Pharmacy: Give after dialysis the pm dose, on dialysis days   200 mg Oral 2 times daily 08/08/23 2257 08/08/23 2358   08/07/23 1530  ceFAZolin  (ANCEF ) IVPB 2g/100 mL premix        over 30 Minutes Intravenous Continuous PRN 08/07/23 1555 08/07/23 1530   08/05/23 2200  voriconazole  (VFEND ) tablet 350 mg  Status:  Discontinued        350 mg Per Tube Every 12 hours 08/05/23 1511 09/11/23 1551   08/01/23 1200  vancomycin  (VANCOCIN ) IVPB 1000 mg/200 mL premix        1,000 mg 200 mL/hr over 60 Minutes Intravenous Every M-W-F (Hemodialysis) 07/31/23 0649 08/01/23 1652   07/30/23 1200  vancomycin  (VANCOCIN ) IVPB 1000 mg/200 mL premix        1,000 mg 200 mL/hr over 60 Minutes Intravenous Every M-W-F (Hemodialysis) 07/30/23 0851 07/30/23 1241   07/29/23 0900  vancomycin  (VANCOCIN ) IVPB 1000 mg/200 mL premix        1,000 mg 200 mL/hr over 60 Minutes Intravenous  Once 07/29/23 0812 07/29/23 0943   07/28/23 1204  vancomycin  variable dose per unstable renal function (pharmacist dosing)  Status:  Discontinued         Does not apply See admin instructions 07/28/23 1204 07/31/23 0649   07/28/23 1000  voriconazole  (VFEND ) 350 mg in sodium chloride  0.9 % 100 mL IVPB  Status:  Discontinued       Placed in Followed by Linked Group   4 mg/kg  88.6 kg (Adjusted) 67.5 mL/hr over 120 Minutes Intravenous Every 12 hours 07/28/23 0748 08/05/23 1511   07/27/23 1000  voriconazole  (VFEND ) 400 mg in sodium chloride  0.9 % 100 mL IVPB  Status:  Discontinued       Placed in Followed by Linked Group   4 mg/kg  100.6 kg 70 mL/hr over 120 Minutes Intravenous Every 12 hours 07/26/23 0826 07/28/23 0748   07/27/23 0945  vancomycin  (VANCOREADY) IVPB 2000 mg/400 mL        2,000 mg 200 mL/hr over 120 Minutes Intravenous  Once 07/27/23 0849 07/27/23 1237   07/26/23 1515  Ampicillin -Sulbactam (UNASYN ) 3 g in sodium chloride  0.9 % 100 mL IVPB  Status:   Discontinued        3 g 200 mL/hr over 30 Minutes Intravenous Every 8 hours 07/26/23 1418 07/27/23 0830   07/26/23 0915  voriconazole  (VFEND ) 600 mg in sodium chloride  0.9 % 150 mL IVPB       Placed in Followed by Linked Group   6 mg/kg  100.6 kg 105 mL/hr over 120 Minutes Intravenous Every 12 hours 07/26/23 0826 07/27/23 0048   07/26/23 0900  Ampicillin -Sulbactam (UNASYN ) 3 g in sodium chloride  0.9 %  100 mL IVPB  Status:  Discontinued        3 g 200 mL/hr over 30 Minutes Intravenous Every 12 hours 07/26/23 0826 07/26/23 1418   07/23/23 1000  levofloxacin  (LEVAQUIN ) IVPB 500 mg  Status:  Discontinued       Placed in Followed by Linked Group   500 mg 100 mL/hr over 60 Minutes Intravenous Every 24 hours 07/22/23 1213 07/23/23 1001   07/23/23 1000  levofloxacin  (LEVAQUIN ) IVPB 500 mg  Status:  Discontinued       Placed in Followed by Linked Group   500 mg 100 mL/hr over 60 Minutes Intravenous Every 24 hours 07/23/23 1001 07/28/23 0804   07/22/23 1300  levofloxacin  (LEVAQUIN ) IVPB 750 mg       Placed in Followed by Linked Group   750 mg 100 mL/hr over 90 Minutes Intravenous  Once 07/22/23 1213 07/22/23 1542   07/21/23 1600  voriconazole  (VFEND ) 300 mg in sodium chloride  0.9 % 100 mL IVPB  Status:  Discontinued        300 mg 65 mL/hr over 120 Minutes Intravenous Every 12 hours 07/21/23 1430 07/22/23 1415   07/21/23 1000  voriconazole  (VFEND ) tablet 200 mg  Status:  Discontinued        200 mg Oral Every 12 hours 07/20/23 0944 07/21/23 1015   07/20/23 1030  voriconazole  (VFEND ) 530 mg in sodium chloride  0.9 % 150 mL IVPB        6 mg/kg  88.6 kg (Adjusted) 101.5 mL/hr over 120 Minutes Intravenous Every 12 hours 07/20/23 0944 07/21/23 0030   07/19/23 1400  meropenem  (MERREM ) 1 g in sodium chloride  0.9 % 100 mL IVPB  Status:  Discontinued        1 g 200 mL/hr over 30 Minutes Intravenous Every 8 hours 07/19/23 1143 07/22/23 1213   07/18/23 0600  meropenem  (MERREM ) 1 g in sodium  chloride 0.9 % 100 mL IVPB  Status:  Discontinued        1 g 200 mL/hr over 30 Minutes Intravenous Every 24 hours 07/17/23 0916 07/19/23 1143   07/17/23 0700  meropenem  (MERREM ) 1 g in sodium chloride  0.9 % 100 mL IVPB  Status:  Discontinued        1 g 200 mL/hr over 30 Minutes Intravenous Every 12 hours 07/17/23 0601 07/17/23 0916   07/15/23 1300  vancomycin  (VANCOCIN ) IVPB 1000 mg/200 mL premix  Status:  Discontinued        1,000 mg 200 mL/hr over 60 Minutes Intravenous Every 24 hours 07/14/23 1129 07/22/23 1319   07/14/23 1215  vancomycin  (VANCOREADY) IVPB 1750 mg/350 mL        1,750 mg 175 mL/hr over 120 Minutes Intravenous  Once 07/14/23 1129 07/14/23 1449   07/09/23 1800  piperacillin -tazobactam (ZOSYN ) IVPB 3.375 g  Status:  Discontinued        3.375 g 12.5 mL/hr over 240 Minutes Intravenous Every 12 hours 07/09/23 0756 07/09/23 0844   07/09/23 1400  piperacillin -tazobactam (ZOSYN ) IVPB 3.375 g  Status:  Discontinued        3.375 g 12.5 mL/hr over 240 Minutes Intravenous Every 8 hours 07/09/23 0844 07/17/23 0557   07/08/23 1300  piperacillin -tazobactam (ZOSYN ) IVPB 3.375 g  Status:  Discontinued        3.375 g 12.5 mL/hr over 240 Minutes Intravenous Every 8 hours 07/08/23 1129 07/09/23 0756   07/05/23 2200  azithromycin  (ZITHROMAX ) 500 mg in sodium chloride  0.9 % 250 mL IVPB  500 mg 250 mL/hr over 60 Minutes Intravenous Daily at bedtime 07/05/23 2022 07/06/23 2302   07/05/23 2115  azithromycin  (ZITHROMAX ) 250 mg in dextrose  5 % 125 mL IVPB  Status:  Discontinued       Note to Pharmacy: Missed dose this morning   250 mg 127.5 mL/hr over 60 Minutes Intravenous Every 24 hours 07/05/23 2016 07/05/23 2021   07/04/23 0900  fluconazole  (DIFLUCAN ) IVPB 200 mg  Status:  Discontinued        200 mg 100 mL/hr over 60 Minutes Intravenous Every 48 hours 07/04/23 0722 07/09/23 0809   07/03/23 0600  cefTRIAXone  (ROCEPHIN ) 1 g in sodium chloride  0.9 % 100 mL IVPB  Status:  Discontinued         1 g 200 mL/hr over 30 Minutes Intravenous Every 24 hours 07/03/23 0508 07/08/23 1104   07/03/23 0600  azithromycin  (ZITHROMAX ) tablet 250 mg  Status:  Discontinued        250 mg Oral Daily 07/03/23 0508 07/05/23 2016        I have personally reviewed the following labs and images: CBC: Recent Labs  Lab 10/06/23 0614 10/07/23 0638 10/08/23 0530 10/09/23 0443 10/10/23 0420 10/11/23 0328 10/12/23 0533  WBC 5.2   < > 6.0 5.3 5.5 5.6 6.1  NEUTROABS 2.9  --   --   --   --   --   --   HGB 9.4*   < > 9.5* 9.5* 10.5* 9.3* 9.9*  HCT 29.2*   < > 28.9* 29.3* 32.5* 29.1* 30.8*  MCV 107.7*   < > 106.6* 106.9* 107.3* 105.8* 106.2*  PLT 441*   < > 481* 430* 417* 387 378   < > = values in this interval not displayed.   BMP &GFR Recent Labs  Lab 10/06/23 0614 10/07/23 0638 10/12/23 0533  NA 134* 132* 131*  K 5.3* 4.6 5.1  CL 100 101 101  CO2 24 22 20*  GLUCOSE 94 87 126*  BUN 22* 23* 24*  CREATININE 1.01 1.24 1.40*  CALCIUM  8.6* 8.6* 8.3*  MG  --  1.8 1.8  PHOS  --   --  3.6   Estimated Creatinine Clearance: 71.8 mL/min (A) (by C-G formula based on SCr of 1.4 mg/dL (H)). Liver & Pancreas: Recent Labs  Lab 10/06/23 0614 10/07/23 0638 10/12/23 0533  AST 10* 9*  --   ALT 7 5  --   ALKPHOS 89 83  --   BILITOT 0.4 0.5  --   PROT 5.5* 5.5*  --   ALBUMIN  2.2* 2.1* 2.3*   No results for input(s): LIPASE, AMYLASE in the last 168 hours. No results for input(s): AMMONIA in the last 168 hours. Diabetic: No results for input(s): HGBA1C in the last 72 hours. Recent Labs  Lab 10/11/23 0806 10/11/23 1212 10/11/23 1641 10/11/23 2100 10/12/23 0832  GLUCAP 174* 150* 109* 128* 164*   Cardiac Enzymes: No results for input(s): CKTOTAL, CKMB, CKMBINDEX, TROPONINI in the last 168 hours. No results for input(s): PROBNP in the last 8760 hours. Coagulation Profile: No results for input(s): INR, PROTIME in the last 168 hours. Thyroid Function Tests: No  results for input(s): TSH, T4TOTAL, FREET4, T3FREE, THYROIDAB in the last 72 hours. Lipid Profile: No results for input(s): CHOL, HDL, LDLCALC, TRIG, CHOLHDL, LDLDIRECT in the last 72 hours. Anemia Panel: No results for input(s): VITAMINB12, FOLATE, FERRITIN, TIBC, IRON, RETICCTPCT in the last 72 hours. Urine analysis:    Component Value Date/Time   COLORURINE  AMBER (A) 08/23/2023 0618   APPEARANCEUR CLOUDY (A) 08/23/2023 0618   LABSPEC 1.017 08/23/2023 0618   PHURINE 5.0 08/23/2023 0618   GLUCOSEU NEGATIVE 08/23/2023 0618   HGBUR NEGATIVE 08/23/2023 0618   BILIRUBINUR NEGATIVE 08/23/2023 0618   KETONESUR NEGATIVE 08/23/2023 0618   PROTEINUR 30 (A) 08/23/2023 0618   NITRITE NEGATIVE 08/23/2023 0618   LEUKOCYTESUR LARGE (A) 08/23/2023 0618   Sepsis Labs: Invalid input(s): PROCALCITONIN, LACTICIDVEN  Microbiology: No results found for this or any previous visit (from the past 240 hours).  Radiology Studies: No results found.    Jaleah Lefevre T. Germaine Shenker [facility]  If 7PM-7AM, please contact night-coverage [email or web address] 10/12/2023, 9:16 AM

## 2023-10-12 NOTE — Plan of Care (Signed)
  Problem: Education: Goal: Knowledge of General Education information will improve Description: Including pain rating scale, medication(s)/side effects and non-pharmacologic comfort measures Outcome: Progressing   Problem: Health Behavior/Discharge Planning: Goal: Ability to manage health-related needs will improve Outcome: Progressing   Problem: Clinical Measurements: Goal: Ability to maintain clinical measurements within normal limits will improve Outcome: Progressing Goal: Will remain free from infection Outcome: Progressing Goal: Diagnostic test results will improve Outcome: Progressing Goal: Respiratory complications will improve Outcome: Progressing Goal: Cardiovascular complication will be avoided Outcome: Progressing   Problem: Activity: Goal: Risk for activity intolerance will decrease Outcome: Progressing   Problem: Nutrition: Goal: Adequate nutrition will be maintained Outcome: Progressing   Problem: Coping: Goal: Level of anxiety will decrease Outcome: Progressing   Problem: Elimination: Goal: Will not experience complications related to bowel motility Outcome: Progressing Goal: Will not experience complications related to urinary retention Outcome: Progressing   Problem: Pain Managment: Goal: General experience of comfort will improve and/or be controlled Outcome: Progressing   Problem: Safety: Goal: Ability to remain free from injury will improve Outcome: Progressing   Problem: Skin Integrity: Goal: Risk for impaired skin integrity will decrease Outcome: Progressing   Problem: Education: Goal: Ability to describe self-care measures that may prevent or decrease complications (Diabetes Survival Skills Education) will improve Outcome: Progressing Goal: Individualized Educational Video(s) Outcome: Progressing   Problem: Coping: Goal: Ability to adjust to condition or change in health will improve Outcome: Progressing   Problem: Fluid  Volume: Goal: Ability to maintain a balanced intake and output will improve Outcome: Progressing   Problem: Health Behavior/Discharge Planning: Goal: Ability to identify and utilize available resources and services will improve Outcome: Progressing Goal: Ability to manage health-related needs will improve Outcome: Progressing   Problem: Metabolic: Goal: Ability to maintain appropriate glucose levels will improve Outcome: Progressing   Problem: Nutritional: Goal: Maintenance of adequate nutrition will improve Outcome: Progressing Goal: Progress toward achieving an optimal weight will improve Outcome: Progressing   Problem: Skin Integrity: Goal: Risk for impaired skin integrity will decrease Outcome: Progressing   Problem: Tissue Perfusion: Goal: Adequacy of tissue perfusion will improve Outcome: Progressing   Problem: Education: Goal: Knowledge about tracheostomy care/management will improve Outcome: Progressing   Problem: Activity: Goal: Ability to tolerate increased activity will improve Outcome: Progressing   Problem: Health Behavior/Discharge Planning: Goal: Ability to manage tracheostomy will improve Outcome: Progressing   Problem: Respiratory: Goal: Patent airway maintenance will improve Outcome: Progressing   Problem: Role Relationship: Goal: Ability to communicate will improve Outcome: Progressing   Problem: Education: Goal: Knowledge of disease and its progression will improve Outcome: Progressing Goal: Individualized Educational Video(s) Outcome: Progressing   Problem: Fluid Volume: Goal: Compliance with measures to maintain balanced fluid volume will improve Outcome: Progressing   Problem: Health Behavior/Discharge Planning: Goal: Ability to manage health-related needs will improve Outcome: Progressing   Problem: Nutritional: Goal: Ability to make healthy dietary choices will improve Outcome: Progressing

## 2023-10-12 NOTE — Plan of Care (Signed)

## 2023-10-13 ENCOUNTER — Encounter (HOSPITAL_COMMUNITY)
Admission: EM | Disposition: A | Discharge status: Skilled Nursing Facility | Payer: Self-pay | Source: Other Acute Inpatient Hospital | Attending: Student

## 2023-10-13 ENCOUNTER — Encounter (HOSPITAL_COMMUNITY): Payer: Self-pay | Admitting: Internal Medicine

## 2023-10-13 ENCOUNTER — Inpatient Hospital Stay (HOSPITAL_COMMUNITY): Payer: Self-pay | Admitting: Anesthesiology

## 2023-10-13 ENCOUNTER — Other Ambulatory Visit: Payer: Self-pay

## 2023-10-13 ENCOUNTER — Inpatient Hospital Stay (HOSPITAL_COMMUNITY)

## 2023-10-13 DIAGNOSIS — I4891 Unspecified atrial fibrillation: Secondary | ICD-10-CM

## 2023-10-13 DIAGNOSIS — I10 Essential (primary) hypertension: Secondary | ICD-10-CM

## 2023-10-13 DIAGNOSIS — F418 Other specified anxiety disorders: Secondary | ICD-10-CM

## 2023-10-13 DIAGNOSIS — F17210 Nicotine dependence, cigarettes, uncomplicated: Secondary | ICD-10-CM

## 2023-10-13 DIAGNOSIS — M84422A Pathological fracture, left humerus, initial encounter for fracture: Secondary | ICD-10-CM

## 2023-10-13 LAB — GLUCOSE, CAPILLARY
Glucose-Capillary: 102 mg/dL — ABNORMAL HIGH (ref 70–99)
Glucose-Capillary: 123 mg/dL — ABNORMAL HIGH (ref 70–99)
Glucose-Capillary: 155 mg/dL — ABNORMAL HIGH (ref 70–99)
Glucose-Capillary: 184 mg/dL — ABNORMAL HIGH (ref 70–99)
Glucose-Capillary: 155 mg/dL — ABNORMAL HIGH (ref 70–99)

## 2023-10-13 LAB — CBC
HCT: 32.3 % — ABNORMAL LOW (ref 39.0–52.0)
Hemoglobin: 10.3 g/dL — ABNORMAL LOW (ref 13.0–17.0)
MCH: 33.9 pg (ref 26.0–34.0)
MCHC: 31.9 g/dL (ref 30.0–36.0)
MCV: 106.3 fL — ABNORMAL HIGH (ref 80.0–100.0)
Platelets: 371 K/uL (ref 150–400)
RBC: 3.04 MIL/uL — ABNORMAL LOW (ref 4.22–5.81)
RDW: 18.7 % — ABNORMAL HIGH (ref 11.5–15.5)
WBC: 5.7 K/uL (ref 4.0–10.5)
nRBC: 0 % (ref 0.0–0.2)

## 2023-10-13 LAB — VORICONAZOLE, SERUM: Voriconazole, Serum: 0.6 ug/mL

## 2023-10-13 LAB — HEPARIN LEVEL (UNFRACTIONATED): Heparin Unfractionated: <0.1 [IU]/mL — ABNORMAL LOW (ref 0.30–0.70)

## 2023-10-13 SURGERY — AMPUTATION, UPPER EXTREMITY, THROUGH FOREARM
Anesthesia: Regional | Laterality: Left

## 2023-10-13 MED ORDER — PHENYLEPHRINE 80 MCG/ML (10ML) SYRINGE FOR IV PUSH (FOR BLOOD PRESSURE SUPPORT)
PREFILLED_SYRINGE | INTRAVENOUS | Status: DC | PRN
  Administered 2023-10-13 (×2): 160 ug via INTRAVENOUS

## 2023-10-13 MED ORDER — CEFAZOLIN SODIUM-DEXTROSE 2-4 GM/100ML-% IV SOLN
2.0000 g | Freq: Three times a day (TID) | INTRAVENOUS | Status: AC
  Administered 2023-10-13 – 2023-10-14 (×3): 2 g via INTRAVENOUS
  Filled 2023-10-13 (×3): qty 100

## 2023-10-13 MED ORDER — SUGAMMADEX SODIUM 200 MG/2ML IV SOLN
INTRAVENOUS | Status: DC | PRN
  Administered 2023-10-13: 200 mg via INTRAVENOUS

## 2023-10-13 MED ORDER — IPRATROPIUM-ALBUTEROL 0.5-2.5 (3) MG/3ML IN SOLN
RESPIRATORY_TRACT | Status: AC
  Filled 2023-10-13: qty 3

## 2023-10-13 MED ORDER — ONDANSETRON HCL 4 MG/2ML IJ SOLN: 4.0000 mg | Freq: Once | INTRAMUSCULAR | Status: DC | PRN

## 2023-10-13 MED ORDER — MIDAZOLAM HCL 2 MG/2ML IJ SOLN: 1.0000 mg | Freq: Once | INTRAMUSCULAR | Status: DC

## 2023-10-13 MED ORDER — CYCLOBENZAPRINE HCL 5 MG PO TABS
5.0000 mg | ORAL_TABLET | Freq: Three times a day (TID) | ORAL | Status: DC | PRN
  Administered 2023-10-13 – 2023-11-06 (×22): 5 mg via ORAL
  Filled 2023-10-13 (×22): qty 1

## 2023-10-13 MED ORDER — VANCOMYCIN HCL 1000 MG IV SOLR
INTRAVENOUS | Status: AC
Start: 2023-10-13 — End: 2023-10-13
  Filled 2023-10-13: qty 20

## 2023-10-13 MED ORDER — METOCLOPRAMIDE HCL 5 MG/ML IJ SOLN: 5.0000 mg | Freq: Three times a day (TID) | INTRAMUSCULAR | Status: DC | PRN

## 2023-10-13 MED ORDER — CEFAZOLIN SODIUM-DEXTROSE 2-3 GM-%(50ML) IV SOLR
INTRAVENOUS | Status: DC | PRN
  Administered 2023-10-13: 2 g via INTRAVENOUS

## 2023-10-13 MED ORDER — OXYCODONE HCL 5 MG PO TABS
10.0000 mg | ORAL_TABLET | ORAL | Status: DC | PRN
  Administered 2023-10-13 – 2023-10-29 (×50): 10 mg via ORAL
  Filled 2023-10-13 (×42): qty 2

## 2023-10-13 MED ORDER — CHLORHEXIDINE GLUCONATE 0.12 % MT SOLN
15.0000 mL | Freq: Once | OROMUCOSAL | Status: AC
  Administered 2023-10-13: 15 mL via OROMUCOSAL

## 2023-10-13 MED ORDER — PROPOFOL 10 MG/ML IV BOLUS
INTRAVENOUS | Status: AC
  Filled 2023-10-13: qty 20

## 2023-10-13 MED ORDER — MIDAZOLAM HCL 2 MG/2ML IJ SOLN
INTRAMUSCULAR | Status: AC
  Filled 2023-10-13: qty 2

## 2023-10-13 MED ORDER — LACTATED RINGERS IV SOLN: INTRAVENOUS | Status: DC

## 2023-10-13 MED ORDER — FENTANYL CITRATE (PF) 250 MCG/5ML IJ SOLN
INTRAMUSCULAR | Status: DC | PRN
  Administered 2023-10-13 (×2): 50 ug via INTRAVENOUS

## 2023-10-13 MED ORDER — CLONIDINE HCL (ANALGESIA) 100 MCG/ML EP SOLN
EPIDURAL | Status: DC | PRN
  Administered 2023-10-13: 100 ug

## 2023-10-13 MED ORDER — ACETAMINOPHEN 10 MG/ML IV SOLN: 1000.0000 mg | Freq: Once | INTRAVENOUS | Status: DC | PRN

## 2023-10-13 MED ORDER — ONDANSETRON HCL 4 MG/2ML IJ SOLN
INTRAMUSCULAR | Status: DC | PRN
  Administered 2023-10-13: 4 mg via INTRAVENOUS

## 2023-10-13 MED ORDER — 0.9 % SODIUM CHLORIDE (POUR BTL) OPTIME
TOPICAL | Status: DC | PRN
  Administered 2023-10-13: 1000 mL

## 2023-10-13 MED ORDER — METOCLOPRAMIDE HCL 5 MG PO TABS: 5.0000 mg | ORAL_TABLET | Freq: Three times a day (TID) | ORAL | Status: DC | PRN

## 2023-10-13 MED ORDER — APIXABAN 5 MG PO TABS
5.0000 mg | ORAL_TABLET | Freq: Two times a day (BID) | ORAL | Status: DC
  Administered 2023-10-13 – 2023-11-07 (×56): 5 mg via ORAL
  Filled 2023-10-13 (×51): qty 1

## 2023-10-13 MED ORDER — HYDROMORPHONE HCL 1 MG/ML IJ SOLN
0.5000 mg | INTRAMUSCULAR | Status: DC | PRN
  Administered 2023-10-13 – 2023-11-07 (×100): 1 mg via INTRAVENOUS
  Filled 2023-10-13 (×91): qty 1

## 2023-10-13 MED ORDER — ROCURONIUM BROMIDE 10 MG/ML (PF) SYRINGE
PREFILLED_SYRINGE | INTRAVENOUS | Status: DC | PRN
  Administered 2023-10-13: 80 mg via INTRAVENOUS
  Administered 2023-10-13: 20 mg via INTRAVENOUS

## 2023-10-13 MED ORDER — VANCOMYCIN HCL 1000 MG IV SOLR
INTRAVENOUS | Status: DC | PRN
  Administered 2023-10-13: 1000 mg via TOPICAL

## 2023-10-13 MED ORDER — FENTANYL CITRATE (PF) 250 MCG/5ML IJ SOLN
INTRAMUSCULAR | Status: AC
  Filled 2023-10-13: qty 5

## 2023-10-13 MED ORDER — PROPOFOL 10 MG/ML IV BOLUS
INTRAVENOUS | Status: DC | PRN
  Administered 2023-10-13: 160 mg via INTRAVENOUS

## 2023-10-13 MED ORDER — ORAL CARE MOUTH RINSE: 15.0000 mL | Freq: Once | OROMUCOSAL | Status: AC

## 2023-10-13 MED ORDER — DEXAMETHASONE SODIUM PHOSPHATE 10 MG/ML IJ SOLN
INTRAMUSCULAR | Status: DC | PRN
  Administered 2023-10-13: 4 mg via INTRAVENOUS

## 2023-10-13 MED ORDER — MIDAZOLAM HCL 2 MG/2ML IJ SOLN
INTRAMUSCULAR | Status: DC | PRN
  Administered 2023-10-13 (×2): 1 mg via INTRAVENOUS

## 2023-10-13 MED ORDER — OXYCODONE HCL 5 MG/5ML PO SOLN: 5.0000 mg | Freq: Once | ORAL | Status: DC | PRN

## 2023-10-13 MED ORDER — HYDROMORPHONE HCL 1 MG/ML IJ SOLN: 0.2500 mg | INTRAMUSCULAR | Status: DC | PRN

## 2023-10-13 MED ORDER — ROPIVACAINE HCL 5 MG/ML IJ SOLN
INTRAMUSCULAR | Status: DC | PRN
Start: 2023-10-13 — End: 2023-10-13
  Administered 2023-10-13: 30 mL via PERINEURAL

## 2023-10-13 MED ORDER — ALBUMIN HUMAN 5 % IV SOLN: INTRAVENOUS | Status: DC | PRN

## 2023-10-13 MED ORDER — IPRATROPIUM-ALBUTEROL 0.5-2.5 (3) MG/3ML IN SOLN
3.0000 mL | Freq: Once | RESPIRATORY_TRACT | Status: AC
  Administered 2023-10-13: 3 mL via RESPIRATORY_TRACT

## 2023-10-13 MED ORDER — DOCUSATE SODIUM 100 MG PO CAPS
100.0000 mg | ORAL_CAPSULE | Freq: Two times a day (BID) | ORAL | Status: DC
  Administered 2023-10-13 – 2023-11-07 (×55): 100 mg via ORAL
  Filled 2023-10-13 (×51): qty 1

## 2023-10-13 MED ORDER — POLYETHYLENE GLYCOL 3350 17 G PO PACK
17.0000 g | PACK | Freq: Every day | ORAL | Status: DC | PRN
  Administered 2023-10-16: 17 g via ORAL
  Filled 2023-10-13: qty 1

## 2023-10-13 MED ORDER — OXYCODONE HCL 5 MG PO TABS: 5.0000 mg | ORAL_TABLET | Freq: Once | ORAL | Status: DC | PRN

## 2023-10-13 MED ORDER — AMISULPRIDE (ANTIEMETIC) 5 MG/2ML IV SOLN: 10.0000 mg | Freq: Once | INTRAVENOUS | Status: DC | PRN

## 2023-10-13 SURGICAL SUPPLY — 42 items
BAG COUNTER SPONGE SURGICOUNT (BAG) ×1 IMPLANT
BLADE CLIPPER SURG (BLADE) ×1 IMPLANT
BLADE SAW SGTL 18.5X63.X.64 HD (BLADE) ×1 IMPLANT
BNDG ELASTIC 4INX 5YD STR LF (GAUZE/BANDAGES/DRESSINGS) IMPLANT
BNDG ELASTIC 4X5.8 VLCR NS LF (GAUZE/BANDAGES/DRESSINGS) IMPLANT
BNDG GAUZE DERMACEA FLUFF 4 (GAUZE/BANDAGES/DRESSINGS) IMPLANT
BRUSH SCRUB EZ 4% CHG (MISCELLANEOUS) IMPLANT
CHLORAPREP W/TINT 26 (MISCELLANEOUS) ×1 IMPLANT
COVER SURGICAL LIGHT HANDLE (MISCELLANEOUS) ×1 IMPLANT
CUFF TOURN SGL QUICK 18X4 (TOURNIQUET CUFF) IMPLANT
CUFF TRNQT CYL 24X4X16.5-23 (TOURNIQUET CUFF) IMPLANT
DRAPE SURG 17X23 STRL (DRAPES) ×1 IMPLANT
DRAPE SURG ORHT 6 SPLT 77X108 (DRAPES) IMPLANT
DRAPE U-SHAPE 47X51 STRL (DRAPES) ×1 IMPLANT
DRSG ADAPTIC 3X8 NADH LF (GAUZE/BANDAGES/DRESSINGS) IMPLANT
DRSG VAC PEEL AND PLACE MED (GAUZE/BANDAGES/DRESSINGS) IMPLANT
ELECTRODE REM PT RTRN 9FT ADLT (ELECTROSURGICAL) ×1 IMPLANT
GAUZE PAD ABD 8X10 STRL (GAUZE/BANDAGES/DRESSINGS) IMPLANT
GAUZE SPONGE 4X4 12PLY STRL (GAUZE/BANDAGES/DRESSINGS) ×1 IMPLANT
GAUZE XEROFORM 5X9 LF (GAUZE/BANDAGES/DRESSINGS) IMPLANT
GLOVE BIO SURGEON STRL SZ 6.5 (GLOVE) ×2 IMPLANT
GLOVE BIO SURGEON STRL SZ7.5 (GLOVE) ×2 IMPLANT
GLOVE BIOGEL PI IND STRL 6.5 (GLOVE) IMPLANT
GLOVE BIOGEL PI IND STRL 7.5 (GLOVE) ×1 IMPLANT
GOWN STRL REUS W/ TWL LRG LVL3 (GOWN DISPOSABLE) ×3 IMPLANT
KIT BASIN OR (CUSTOM PROCEDURE TRAY) ×1 IMPLANT
KIT TURNOVER KIT B (KITS) ×1 IMPLANT
MANIFOLD NEPTUNE II (INSTRUMENTS) ×1 IMPLANT
NS IRRIG 1000ML POUR BTL (IV SOLUTION) ×1 IMPLANT
PACK ORTHO EXTREMITY (CUSTOM PROCEDURE TRAY) ×1 IMPLANT
PAD ARMBOARD POSITIONER FOAM (MISCELLANEOUS) ×1 IMPLANT
PAD CAST 4YDX4 CTTN HI CHSV (CAST SUPPLIES) IMPLANT
SPONGE T-LAP 18X18 ~~LOC~~+RFID (SPONGE) IMPLANT
STAPLER SKIN PROX 35W (STAPLE) IMPLANT
SUCTION TUBE FRAZIER 10FR DISP (SUCTIONS) IMPLANT
SUT ETHILON 3 0 PS 1 (SUTURE) ×1 IMPLANT
SUT MON AB 2-0 CT1 36 (SUTURE) ×2 IMPLANT
SUT SILK 2-0 18XBRD TIE 12 (SUTURE) ×1 IMPLANT
SUT VIC AB 0 CT1 27XBRD ANBCTR (SUTURE) ×1 IMPLANT
TOWEL GREEN STERILE (TOWEL DISPOSABLE) ×1 IMPLANT
UNDERPAD 30X36 HEAVY ABSORB (UNDERPADS AND DIAPERS) ×1 IMPLANT
WATER STERILE IRR 1000ML POUR (IV SOLUTION) ×1 IMPLANT

## 2023-10-13 NOTE — Anesthesia Postprocedure Evaluation (Signed)
 Anesthesia Post Note  Patient: Frank [name].  Procedure(s) Performed: AMPUTATION, UPPER EXTREMITY, THROUGH HUMERUS (Left)     Patient location during evaluation: PACU Anesthesia Type: Regional and General Level of consciousness: awake and alert Pain management: pain level controlled Vital Signs Assessment: post-procedure vital signs reviewed and stable Respiratory status: spontaneous breathing, nonlabored ventilation, respiratory function stable and patient connected to nasal cannula oxygen Cardiovascular status: blood pressure returned to baseline and stable Postop Assessment: no apparent nausea or vomiting Anesthetic complications: no   No notable events documented.  Last Vitals:  Vitals:   10/13/23 1030 10/13/23 1045  BP: (!) 124/97 (!) 123/103  Pulse: (!) 104 (!) 104  Resp: (!) 29 17  Temp:  36.7 C  SpO2: 90% 97%    Last Pain:  Vitals:   10/13/23 1045  TempSrc:   PainSc: Asleep                 [name]

## 2023-10-13 NOTE — Interval H&P Note (Signed)
 History and Physical Interval Note:  10/13/2023 7:12 AM  Frank [name].  has presented today for surgery, with the diagnosis of Left humerus nonunion.  The various methods of treatment have been discussed with the patient and family. After consideration of risks, benefits and other options for treatment, the patient has consented to  Procedure(s): AMPUTATION, UPPER EXTREMITY, THROUGH HUMERUS (Left) as a surgical intervention.  The patient's history has been reviewed, patient examined, no change in status, stable for surgery.  I have reviewed the patient's chart and labs.  Questions were answered to the patient's satisfaction.     Annarose Ouellet P Shylie Polo

## 2023-10-13 NOTE — Anesthesia Procedure Notes (Signed)
 Anesthesia Regional Block: Interscalene brachial plexus block   Pre-Anesthetic Checklist: , timeout performed,  Correct Patient, Correct Site, Correct Laterality,  Correct Procedure, Correct Position, site marked,  Risks and benefits discussed,  Surgical consent,  Pre-op evaluation,  At surgeon's request and post-op pain management  Laterality: Upper and Left  Prep: Maximum Sterile Barrier Precautions used, chloraprep       Needles:  Injection technique: Single-shot  Needle Type: Echogenic Needle     Needle Length: 5cm  Needle Gauge: 21     Additional Needles:   Procedures:,,,, ultrasound used (permanent image in chart),,    Narrative:  Start time: 10/13/2023 7:13 AM End time: 10/13/2023 7:18 AM Injection made incrementally with aspirations every 5 mL.  Performed by: Personally  Anesthesiologist: Jefm Garnette LABOR, MD  Additional Notes: Block assessed prior to procedure. Patient tolerated procedure well.

## 2023-10-13 NOTE — Anesthesia Procedure Notes (Signed)
 Procedure Name: Intubation Date/Time: 10/13/2023 7:40 AM  Performed by: Lockie Flesher, CRNAPre-anesthesia Checklist: Patient identified, Emergency Drugs available, Suction available and Patient being monitored Patient Re-evaluated:Patient Re-evaluated prior to induction Oxygen Delivery Method: Circle System Utilized Preoxygenation: Pre-oxygenation with 100% oxygen Induction Type: IV induction Ventilation: Mask ventilation without difficulty Laryngoscope Size: Glidescope and 3 Grade View: Grade I Tube type: Oral Tube size: 6.5 mm Number of attempts: 1 Airway Equipment and Method: Stylet and Oral airway Placement Confirmation: ETT inserted through vocal cords under direct vision, positive ETCO2 and breath sounds checked- equal and bilateral Secured at: 23 cm Tube secured with: Tape Dental Injury: Teeth and Oropharynx as per pre-operative assessment

## 2023-10-13 NOTE — Plan of Care (Signed)
  Problem: Education: Goal: Knowledge of General Education information will improve Description: Including pain rating scale, medication(s)/side effects and non-pharmacologic comfort measures Outcome: Progressing   Problem: Health Behavior/Discharge Planning: Goal: Ability to manage health-related needs will improve Outcome: Progressing   Problem: Clinical Measurements: Goal: Ability to maintain clinical measurements within normal limits will improve Outcome: Progressing Goal: Will remain free from infection Outcome: Progressing Goal: Diagnostic test results will improve Outcome: Progressing Goal: Respiratory complications will improve Outcome: Progressing Goal: Cardiovascular complication will be avoided Outcome: Progressing   Problem: Activity: Goal: Risk for activity intolerance will decrease Outcome: Progressing   Problem: Nutrition: Goal: Adequate nutrition will be maintained Outcome: Progressing   Problem: Coping: Goal: Level of anxiety will decrease Outcome: Progressing   Problem: Elimination: Goal: Will not experience complications related to bowel motility Outcome: Progressing Goal: Will not experience complications related to urinary retention Outcome: Progressing   Problem: Pain Managment: Goal: General experience of comfort will improve and/or be controlled Outcome: Progressing   Problem: Safety: Goal: Ability to remain free from injury will improve Outcome: Progressing   Problem: Skin Integrity: Goal: Risk for impaired skin integrity will decrease Outcome: Progressing   Problem: Education: Goal: Ability to describe self-care measures that may prevent or decrease complications (Diabetes Survival Skills Education) will improve Outcome: Progressing Goal: Individualized Educational Video(s) Outcome: Progressing   Problem: Coping: Goal: Ability to adjust to condition or change in health will improve Outcome: Progressing   Problem: Fluid  Volume: Goal: Ability to maintain a balanced intake and output will improve Outcome: Progressing   Problem: Health Behavior/Discharge Planning: Goal: Ability to identify and utilize available resources and services will improve Outcome: Progressing Goal: Ability to manage health-related needs will improve Outcome: Progressing   Problem: Metabolic: Goal: Ability to maintain appropriate glucose levels will improve Outcome: Progressing   Problem: Nutritional: Goal: Maintenance of adequate nutrition will improve Outcome: Progressing Goal: Progress toward achieving an optimal weight will improve Outcome: Progressing   Problem: Skin Integrity: Goal: Risk for impaired skin integrity will decrease Outcome: Progressing   Problem: Tissue Perfusion: Goal: Adequacy of tissue perfusion will improve Outcome: Progressing   Problem: Education: Goal: Knowledge of General Education information will improve Description: Including pain rating scale, medication(s)/side effects and non-pharmacologic comfort measures Outcome: Progressing   Problem: Health Behavior/Discharge Planning: Goal: Ability to manage health-related needs will improve Outcome: Progressing   Problem: Clinical Measurements: Goal: Ability to maintain clinical measurements within normal limits will improve Outcome: Progressing Goal: Will remain free from infection Outcome: Progressing Goal: Diagnostic test results will improve Outcome: Progressing Goal: Respiratory complications will improve Outcome: Progressing Goal: Cardiovascular complication will be avoided Outcome: Progressing   Problem: Activity: Goal: Risk for activity intolerance will decrease Outcome: Progressing   Problem: Nutrition: Goal: Adequate nutrition will be maintained Outcome: Progressing   Problem: Coping: Goal: Level of anxiety will decrease Outcome: Progressing   Problem: Elimination: Goal: Will not experience complications related to  bowel motility Outcome: Progressing Goal: Will not experience complications related to urinary retention Outcome: Progressing   Problem: Pain Managment: Goal: General experience of comfort will improve and/or be controlled Outcome: Progressing   Problem: Safety: Goal: Ability to remain free from injury will improve Outcome: Progressing   Problem: Skin Integrity: Goal: Risk for impaired skin integrity will decrease Outcome: Progressing

## 2023-10-13 NOTE — Progress Notes (Signed)
 Pharmacy Antibiotic Note  Frank [name]. is a 58 y.o. male admitted on 07/03/2023 with generalized weakness (multifactorial secondary to hypercalcemia, suspected malignancy, multiple myeloma).  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    Level history: 08/01/23 1.9 mcg/ml on 350 mg/12h 08/08/23 0.4 mcg/ml on 350 mg/12h (low due to tube feed binding) 08/19/23 1.3 mcg/ml continue 350 mg/12h 08/30/23 1.8 mcg/ml (drawn AFTER the dose) 08/31/23 0.6 mcg/ml drawn prior to the dose but ~2 hours late of true trough 09/08/23 0.7 mcg/ml drawn ~2 hours late of true trough and likely binding to bolus feeds 09/17/23 0.6 mcg/ml - drawn appropriately ~ 11 hours after previous dose   09/29/23 1.9 mcg/ml drawn appropriately  10/07/23 0.6 mcg/ml- drawn appropriately /slightly late about 12.5 hours after the last dose.   Patient is on supplements that are time out appropriately from voriconazole  but he may be taking them throughout the day.    Plan: - Continue voriconazole  400 mg BID - keep at 1000/2200 - Will continue to monitor voriconazole  and levels as indicated- Will check on more time 7/29 AM before end date on 8/5   Height: 6' (182.9 cm) Weight: 107 kg (235 lb 14.3 oz) IBW/kg (Calculated) : 77.6  Temp (24hrs), Avg:98.4 F (36.9 C), Min:98 F (36.7 C), Max:99 F (37.2 C)  Recent Labs  Lab 10/07/23 0638 10/08/23 0530 10/09/23 0443 10/10/23 0420 10/11/23 0328 10/12/23 0533 10/13/23 0610  WBC 5.1   < > 5.3 5.5 5.6 6.1 5.7  CREATININE 1.24  --   --   --   --  1.40*  --    < > = values in this interval not displayed.    Estimated Creatinine Clearance: 71.8 mL/min (A) (by C-G formula based on SCr of 1.4 mg/dL (H)).    Allergies  Allergen Reactions   Morphine  And Codeine Anaphylaxis   Shellfish Allergy Anaphylaxis   Betadine  [Povidone Iodine ] Itching   Bupropion Other (See Comments)    Shaking of the body, hallucinations     Chlorhexidine  Hives    Patient reports never had issues CHG with mouth rinse.   Influenza Vaccines Hives   Metoprolol  Rash    Thank you for allowing pharmacy to be a part of this patient's care.  Damien Quiet, PharmD, BCPS, BCIDP Infectious Diseases Clinical Pharmacist Phone: [phone] 10/13/2023 3:54 PM   **Pharmacist phone directory can now be found on [email or web address] ([email or web address]).  Listed under [facility].

## 2023-10-13 NOTE — Progress Notes (Signed)
 Frank [name] is now going down to surgery.  He has had heparin  stopped.  He is ready for surgery.  His lab work today looks all right.  His CBC showed white count 5.7.  Hemoglobin 10.3.  Platelet count 371,000.  He is eating well.  He has had no problems with nausea or vomiting.  He still has a drainage tube in the right thigh.  Again, cultures have all been negative.  He has had no cough or shortness of breath.  He has had no fever.  Again, he has had no treatment for his myeloma probably for about a month.  We really, really need to get him started back on treatment soon.  I am your think about doing this next week.  He really would benefit from a Port-A-Cath.  However, IR want to wait until after this drainage catheter is taken out.  His vital signs show temperature of 99.  Pulse 85.  Blood pressure 153/97.  His head and exam shows no ocular or oral lesions.  There are no palpable cervical or supraclavicular lymph nodes.  Lungs are clear bilaterally.  Cardiac exam regular rate and rhythm.  Abdomen is soft.  Abdomen is somewhat obese.  He has no fluid wave.  There is no palpable liver or spleen tip.  Extremities shows a little swelling in the right thigh.  There is no swelling in the lower legs.  He has good pulses.  Neurological exam shows no focal neurological deficits.  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  This is not functional.  Is been causing pain.  Hopefully, he will do well and not have problems with phantom limb pain.  Again the myeloma has responded very very well.  24-hour urine looks a whole lot better.  His serum studies are also much much better.  Again we really have to get treatment started back on him.  I would like to think that he should, at some point, be able to go to some kind of rehab.  I finally had to meet his wife.  She is very very nice.  I know that she is such a inspiration and support system for him.   Jeralyn Crease, MD  2 [name] 2 :1

## 2023-10-13 NOTE — Progress Notes (Signed)
 PHARMACY - ANTICOAGULATION CONSULT NOTE  Pharmacy Consult for Restart Apixaban  Indication: DVT  Allergies  Allergen Reactions   Morphine  And Codeine Anaphylaxis   Shellfish Allergy Anaphylaxis   Betadine  [Povidone Iodine ] Itching   Bupropion Other (See Comments)    Shaking of the body, hallucinations    Chlorhexidine  Hives    Patient reports never had issues CHG with mouth rinse.   Influenza Vaccines Hives   Metoprolol  Rash    Patient Measurements: Height: 6' (182.9 cm) Weight: 107 kg (235 lb 14.3 oz) IBW/kg (Calculated) : 77.6 HEPARIN  DW (KG): 104.1  Vital Signs: Temp: 98.6 F (37 C) (07/28 1129) Temp Source: Oral (07/28 0406) BP: 123/81 (07/28 1129) Pulse Rate: 102 (07/28 1129)  Labs: Recent Labs    10/11/23 0328 10/12/23 0533 10/13/23 0610  HGB 9.3* 9.9* 10.3*  HCT 29.1* 30.8* 32.3*  PLT 387 378 371  HEPARINUNFRC 0.48 0.38 <0.10*  CREATININE  --  1.40*  --     Estimated Creatinine Clearance: 71.8 mL/min (A) (by C-G formula based on SCr of 1.4 mg/dL (H)).   Medical History: Past Medical History:  Diagnosis Date   Anxiety    Arthritis    Asthma    Bipolar disorder (HCC)    Current every day smoker    Depression    Diabetes mellitus, type II (HCC)    Dyspnea    History of kidney stones    History of pneumonia    Hyperlipidemia    Hypertension    Morbid obesity (HCC)    Sedentary lifestyle    Seizures (HCC)    last seizure 12 yrs ago, no current problem   Sleep apnea    uses CPAP nightly    Medications:  Scheduled:   (feeding supplement) PROSource Plus  30 mL Oral TID BM   acyclovir   400 mg Oral BID   collagenase    Topical Daily   docusate sodium   100 mg Oral BID   epoetin  alfa  40,000 Units Subcutaneous Q Wed-1800   feeding supplement  237 mL Oral BID WC   ferrous sulfate   325 mg Oral Q breakfast   guaiFENesin   600 mg Oral BID   hydrALAZINE   25 mg Oral Q8H   insulin  aspart  0-15 Units Subcutaneous TID AC & HS   insulin  aspart  2 Units  Subcutaneous TID AC & HS   insulin  glargine-yfgn  10 Units Subcutaneous Q24H   lidocaine   1 patch Transdermal Q24H   montelukast   5 mg Oral QHS   multivitamin with minerals  1 tablet Oral Daily   nutrition supplement (JUVEN)  1 packet Oral BID AC   mouth rinse  15 mL Mouth Rinse 4 times per day   pantoprazole   40 mg Oral BID   PARoxetine   40 mg Oral Daily   polyethylene glycol  17 g Oral BID   senna  1 tablet Oral Daily   sodium chloride  flush  10-40 mL Intracatheter Q12H   umeclidinium-vilanterol  1 puff Inhalation Daily   valproic  acid  750 mg Oral BID   verapamil   120 mg Oral Daily   voriconazole   400 mg Oral Q12H   Infusions:   sodium chloride  Stopped (10/13/23 0952)   sodium chloride  Stopped (08/28/23 9160)   heparin  Stopped (10/13/23 0500)    Assessment: 58 years of age male with 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 Pharmacy consulted to change back to Apixaban  therapy. Received >7 days of IV heparin  with therapeutic levels.  No dose adjustment needed  for apix-vori interaction.   Goal of Therapy:  Monitor platelets by anticoagulation protocol: Yes   Plan:  D/C Heparin .  Restart Apixaban  5mg  po BID.   Harlene Boga, PharmD, BCPS, BCCCP Please refer to [facility] [place] [facility] numbers 10/13/2023,12:04 PM

## 2023-10-13 NOTE — Progress Notes (Addendum)
 0500 Heparin  stopped at this time pending surgery as prescribed.  Dr ONEIDA. Opyd recommended Heparin  stopped 6-8 hrs prior to surgery. Kept NPO through shift.  0530 CHG bath & Oral care performed. Tolerated well.  9364 Left for OR, accompanied by transporter.  AO x 4. NAD noted. Spouse at bedside.

## 2023-10-13 NOTE — Op Note (Signed)
 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  Post-Op Diagnosis: Same  Procedures: CPT 24900-Amputation of left upper arm through humerus CPT 20245-Deep biopsy of left arm/humerus CPT 20680-Removal of hardware left humerus CPT 97605-Incisional wound vac placement left arm  Surgeons : Primary: [name], MD  Assistant: [name], PA-C  Location: OR 3   Anesthesia: General with regional block   Antibiotics: Ancef  2g preop with 1 gm vancomycin  powder placed topically   Tourniquet time: None    Estimated Blood Loss: 750 mL  Complications:* No complications entered in OR log *   Specimens: ID Type Source Tests Collected by Time Destination  1 : Left arm amputation above elbow. Tissue PATH Digit amputation SURGICAL PATHOLOGY Jackye Dever, Franky SQUIBB, MD 10/13/2023 [phone]   A : Left humerus nonunion site, bone culture #1 Tissue PATH Benign ortho AEROBIC/ANAEROBIC CULTURE W GRAM STAIN (SURGICAL/DEEP WOUND) Irene Collings, Franky SQUIBB, MD 10/13/2023 0830   B : Left humerus nonunion site, bone culture #2 Tissue PATH Benign ortho AEROBIC/ANAEROBIC CULTURE W GRAM STAIN (SURGICAL/DEEP WOUND) [name], MD 10/13/2023 [phone]      Implants: * No implants in log *   Indications for Surgery: 58 year old male with complex history to the left upper extremity.  He sustained a left supracondylar distal humerus fracture was treated with open reduction internal fixation.  This subsequently had some hardware failure and was bothering him so he underwent hardware removal.  He subsequently started develop a nonunion at that area.  Eventually he developed a pathologic fracture above in the humeral shaft.  We underwent open reduction internal fixation of the humerus.  However his bone continued to become eroded away to the point that it was thought to be  infected and required advanced reconstruction.  Unfortunately the patient was found to have multiple myeloma and ended up getting very sick and was placed in the ICU.  His arm continued to hurt and had no function of the upper extremity.  Risks and benefits were discussed about amputation versus extensive reconstruction.  After full discussion the patient wishes to proceed with through humerus amputation.  Operative Findings: 1.  Removal of previous humerus hardware with transfemoral amputation. 2.  Deep biopsy and cultures were taken at the nonunion site with what appeared to be necrotic material potentially from metal reaction or multiple myeloma.  Procedure: The patient was identified in the preoperative holding area. Consent was confirmed with the patient and their family and all questions were answered. The operative extremity was marked after confirmation with the patient. he was then brought back to the operating room by our anesthesia colleagues.  He was placed under general anesthetic and carefully transferred over to radiolucent flattop table.  The left upper extremity was then prepped and draped in usual sterile fashion.  A timeout was performed to verify the patient, the procedure, and the extremity.  Preoperative antibiotics were dosed.  Fluoroscopic imaging showed the location of the nonunion site which I plan for amputation right above it.  A lateral medial approach was made and carried down through skin subcutaneous tissue.  I took care to carefully dissect to the neurovascular bundle medially and tied off the artery and the vein.  Identified the radial median and ulnar nerve and tagged these for neurectomy.  I then proceeded with dissection posterior laterally along the humerus until I reached the plate.  I was able to remove the 4 proximal screws.  Medially I did encounter a pocket of necrotic material around the nonunion site that had the appearance of a metal reaction.  I sent this for  culture and specimen.  I irrigated this and then proceeded to complete the amputation by incising through the brachialis and triceps muscle.  I was able to remove the entirety of the humerus and this was sent to pathology.  Once I had the arm removed I then provided tension on the nerves and incised them allowing him to the retract into the soft tissues.  I then debrided the end of the humerus.  I then sutured the brachialis to the triceps using 0 PDS after irrigating thoroughly.  A gram vancomycin  powder was placed prior to closure as well.  I then closed the stump with a 2-0 Monocryl and 3-0 nylon.  Incisional wound VAC was placed.  Dressing was applied to this.  The patient was then awoke from anesthesia and taken to the PACU in stable condition.  Post Op Plan/Instructions: The patient will be nonweightbearing to the left upper extremity.  He will receive postoperative antibiotics.  He will receive aspirin  for DVT prophylaxis from an orthopedic perspective.  Will have him mobilize with physical and Occupational Therapy.  I was present and performed the entire surgery.  [name], PA-C did assist me throughout the case. An assistant was necessary given the difficulty in approach, maintenance of reduction and ability to instrument the fracture.   [name], MD [facility]

## 2023-10-13 NOTE — TOC Progression Note (Signed)
 Transition of Care (TOC) - Progression Note    Patient Details  Name: Frank [name]. MRN: [id number] Date of Birth: [birth year]  Transition of Care [facility]) CM/SW Contact  Lauraine FORBES Saa, LCSW Phone Number: 10/13/2023, 3:41 PM  Clinical Narrative:     3:41 PM Per chart review and progressions, patient received left arm amputation procedure today. CSW will submit new FL2 post-op upon updated PT/OT recommendations. CSW will continue to follow and be available to assist.  Expected Discharge Plan: Skilled Nursing Facility Barriers to Discharge: Continued Medical Work up, English as a second language teacher               Expected Discharge Plan and Services In-house Referral: Clinical Social Work Discharge Planning Services: [employer] Consult Post Acute Care Choice: Skilled Nursing Facility Living arrangements for the past 2 months: Single Family Home                 DME Arranged: N/A DME Agency: NA       HH Arranged: NA HH Agency: NA         Social Drivers of Health (SDOH) Interventions SDOH Screenings   Food Insecurity: No Food Insecurity (07/05/2023)  Housing: Low Risk  (07/05/2023)  Transportation Needs: No Transportation Needs (07/05/2023)  Utilities: Not At Risk (07/05/2023)  Tobacco Use: High Risk (10/13/2023)    Readmission Risk Interventions     No data to display

## 2023-10-13 NOTE — Consult Note (Signed)
 Chief Complaint: Right hip fluid collection s/p percutaneous drain placement with Dr. Jenna 09/26/23   Referring Provider(s): Montano,Emil   Supervising Physician: Philip Cornet  Patient Status: [name] - In-pt  History of Present Illness: Frank Taul. is a 58 y.o. male with past medical history significant for OSA, uses CPAP, multiple myeloma, A-fib (not on AC), hyperlipidemia, tobacco use disorder.  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 He was admitted to an outside hospital with concern for sepsis, pneumonia, encephalopathy after feeling weak for 1 week.  He developed renal failure while admitted in the ICU for 36 days.  His care required tracheostomy, HD, PEG tube placement.  Currently the patient is not requiring ventilator (room air) or hemodialysis. PEG tube has since been removed.   While admitted patient developed right leg numbness and pain.  CT of the hip revealed a large fluid collection intermuscularly (gluteus medius and rectus femoris).  Ortho was consulted and recommended IR aspiration and drain placement which was approved and performed by Dr. Jenna on 7/11 (10Fr ).   On 7/25, Dr. Jenna exchanged drain and sclerotherapy was performed (doxycycline ).   Denies fever, chills, new leg pain. Patient states that having had planned LUE amputation has been a relief to not have this hanging over his head.  Patient is sitting up in bed, pleasant, interested in his plan of care.  Allergies Reviewed:  Morphine  and codeine, Shellfish allergy, Betadine  [povidone iodine ], Bupropion, Chlorhexidine , Influenza vaccines, and Metoprolol    Patient is Full Code  Past Medical History:  Diagnosis Date   Anxiety    Arthritis    Asthma    Bipolar disorder (HCC)    Current every day smoker    Depression    Diabetes mellitus, type II (HCC)     Dyspnea    History of kidney stones    History of pneumonia    Hyperlipidemia    Hypertension    Morbid obesity (HCC)    Sedentary lifestyle    Seizures (HCC)    last seizure 12 yrs ago, no current problem   Sleep apnea    uses CPAP nightly    Past Surgical History:  Procedure Laterality Date   CARDIAC CATHETERIZATION  2020   carpal tunel Left    COLONOSCOPY     HARDWARE REMOVAL Left 07/26/2022   Procedure: HARDWARE REMOVAL ELBOW;  Surgeon: [name], MD;  Location: [facility] OR;  Service: Orthopedics;  Laterality: Left;   IR FLUORO GUIDE CV LINE RIGHT  08/07/2023   IR FLUORO GUIDE CV LINE RIGHT  09/01/2023   IR GASTROSTOMY TUBE MOD SED  08/14/2023   IR GASTROSTOMY TUBE REMOVAL  10/01/2023   IR REMOVAL TUN CV CATH W/O FL  08/29/2023   IR REMOVAL TUN CV CATH W/O FL  09/08/2023   IR SCLEROTHERAPY OF A FLUID COLLECTION  10/10/2023   IR US  GUIDE VASC ACCESS RIGHT  08/07/2023   IR US  GUIDE VASC ACCESS RIGHT  09/01/2023   ORIF HUMERUS FRACTURE Left 01/16/2022   Procedure: OPEN REDUCTION INTERNAL FIXATION (ORIF) DISTAL HUMERUS FRACTURE;  Surgeon: [name], MD;  Location: [facility] OR;  Service: Orthopedics;  Laterality: Left;   ORIF HUMERUS FRACTURE Left 01/29/2023   Procedure: OPEN REDUCTION INTERNAL FIXATION (ORIF) DISTAL HUMERUS FRACTURE;  Surgeon: [name], MD;  Location: [facility] OR;  Service: Orthopedics;  Laterality: Left;   OTHER  SURGICAL HISTORY     R & L shoulder   OTHER SURGICAL HISTORY Right    Knee surgery x several   TOTAL HIP ARTHROPLASTY Left 12/26/2017   Procedure: LEFT TOTAL HIP ARTHROPLASTY ANTERIOR APPROACH;  Surgeon: Vernetta Lonni GRADE, MD;  Location: [facility];  Service: Orthopedics;  Laterality: Left;   TOTAL KNEE ARTHROPLASTY Left 11/23/2021   Procedure: LEFT TOTAL KNEE ARTHROPLASTY;  Surgeon: Vernetta Lonni GRADE, MD;  Location: [facility];  Service: Orthopedics;  Laterality: Left;      Medications: Prior to Admission medications   Medication Sig Start  Date End Date Taking? Authorizing Provider  albuterol  (PROVENTIL  HFA;VENTOLIN  HFA) 108 (90 Base) MCG/ACT inhaler Inhale 2 puffs into the lungs every 6 (six) hours as needed for wheezing or shortness of breath.   Yes [provider]  albuterol  (PROVENTIL ) (2.5 MG/3ML) 0.083% nebulizer solution 2.5 mg every 2 (two) hours as needed for wheezing or shortness of breath.   Yes [provider]  aspirin  EC 81 MG tablet Take 81 mg by mouth daily. Swallow whole.   Yes [provider]  busPIRone  (BUSPAR ) 15 MG tablet Take 15 mg by mouth at bedtime.   Yes [provider]  celecoxib  (CELEBREX ) 200 MG capsule Take 200 mg by mouth daily.   Yes [provider]  clonazePAM  (KLONOPIN ) 1 MG tablet Take 1 mg by mouth once as needed for anxiety.   Yes [provider]  cyclobenzaprine  (FLEXERIL ) 5 MG tablet Take 1 tablet (5 mg total) by mouth 3 (three) times daily as needed for muscle spasms. Patient taking differently: Take 5 mg by mouth daily as needed for muscle spasms. 01/30/23  Yes Danton Lauraine LABOR, PA-C  divalproex  (DEPAKOTE ) 500 MG DR tablet Take 500 mg by mouth 2 (two) times daily.   Yes [provider]  EPINEPHrine  0.3 mg/0.3 mL IJ SOAJ injection Inject 0.3 mg into the muscle as needed for anaphylaxis. 08/28/21  Yes [provider]  esomeprazole (NEXIUM) 40 MG capsule Take 40 mg by mouth in the morning.   Yes [provider]  Fluticasone -Salmeterol (ADVAIR) 500-50 MCG/DOSE AEPB Inhale 2 puffs into the lungs in the morning.   Yes [provider]  furosemide  (LASIX ) 40 MG tablet Take 40 mg by mouth daily as needed for fluid or edema.   Yes [provider]  gabapentin  (NEURONTIN ) 300 MG capsule Take 300-600 mg by mouth See admin instructions. 300 mg in the morning, 600 mg at bedtime   Yes [provider]  hydrALAZINE  (APRESOLINE ) 10 MG tablet Take 10 mg by mouth in the morning and at bedtime.   Yes [provider]  hydrochlorothiazide  (HYDRODIURIL ) 25 MG tablet Take 25 mg by mouth daily.   Yes [provider]  lisinopril  (ZESTRIL ) 40 MG tablet Take 40 mg by mouth daily. 03/25/23  Yes [provider]  metFORMIN  (GLUCOPHAGE ) 1000 MG tablet Take 1,000 mg by mouth 2 (two) times daily with a meal.   Yes [provider]  montelukast  (SINGULAIR ) 10 MG tablet Take 10 mg by mouth at bedtime.   Yes [provider]  OVER THE COUNTER MEDICATION Take 400 mg by mouth at bedtime. Burdock root   Yes [provider]  oxyCODONE  (ROXICODONE ) 5 MG immediate release tablet Take 1 tablet (5 mg total) by mouth every 4 (four) hours as needed for severe pain (pain score 7-10). 01/30/23  Yes Danton Lauraine LABOR, PA-C  PARoxetine  (PAXIL ) 40 MG tablet Take 40 mg by  mouth every morning.   Yes [provider]  spironolactone  (ALDACTONE ) 25 MG tablet Take 25 mg by mouth at bedtime. 10/07/21  Yes [provider]  tamsulosin  (FLOMAX ) 0.4 MG CAPS capsule Take 0.4 mg by mouth at bedtime. 10/07/21  Yes [provider]  testosterone  cypionate (DEPOTESTOSTERONE CYPIONATE) 200 MG/ML injection Inject 0.5 mLs (100 mg total) into the muscle every 14 (fourteen) days. 09/17/16  Yes Nida, Gebreselassie W, MD  verapamil  (CALAN -SR) 120 MG CR tablet Take 120 mg by mouth daily.   Yes [provider]  Vitamin D , Ergocalciferol , (DRISDOL ) 1.25 MG (50000 UNIT) CAPS capsule Take 1 capsule (50,000 Units total) by mouth every Thursday. Patient taking differently: Take 50,000 Units by mouth once a week. Tuesday 02/06/23  Yes Danton Lauraine LABOR, PA-C  buPROPion (WELLBUTRIN XL) 150 MG 24 hr tablet Take 150 mg by mouth daily. Patient not taking: Reported on 07/04/2023    [provider]     Family History  Problem Relation Age of Onset   Diabetes Mother    Hypertension Mother    Heart attack Mother    Osteoporosis Mother    Diabetes Father    Hypertension Father     Heart attack Father    Diabetes Sister    Hypertension Sister    Diabetes Brother    Hypertension Brother     Social History   Socioeconomic History   Marital status: Married    Spouse name: Not on file   Number of children: Not on file   Years of education: Not on file   Highest education level: Not on file  Occupational History   Not on file  Tobacco Use   Smoking status: Every Day    Current packs/day: 0.50    Average packs/day: 0.5 packs/day for 25.0 years (12.5 ttl pk-yrs)    Types: Cigarettes   Smokeless tobacco: Never  Vaping Use   Vaping status: Never Used  Substance and Sexual Activity   Alcohol  use: No   Drug use: Yes    Types: Marijuana    Comment: 01/28/2023   Sexual activity: Yes  Other Topics Concern   Not on file  Social History Narrative   Not on file   Social Drivers of Health   Financial Resource Strain: Not on file  Food Insecurity: No Food Insecurity (07/05/2023)   Hunger Vital Sign    Worried About Running Out of Food in the Last Year: Never true    Ran Out of Food in the Last Year: Never true  Transportation Needs: No Transportation Needs (07/05/2023)   PRAPARE - Administrator, Civil Service (Medical): No    Lack of Transportation (Non-Medical): No  Physical Activity: Not on file  Stress: Not on file  Social Connections: Not on file     Review of Systems: A 12 point ROS discussed and pertinent positives are indicated in the HPI above.  All other systems are negative.   Vital Signs: BP 123/81 (BP Location: Right Arm)   Pulse (!) 102   Temp 98.6 F (37 C)   Resp 19   Ht 6' (1.829 m)   Wt 235 lb 14.3 oz (107 kg)   SpO2 98%   BMI 31.99 kg/m     Physical Exam Constitutional:      Appearance: He is obese.  HENT:     Mouth/Throat:     Mouth: Mucous membranes are moist.     Pharynx: Oropharynx is clear.  Pulmonary:  Effort: Pulmonary effort is normal.  Musculoskeletal:     Comments: RLE swelling, R hip drain  present, brown fluid in collection (pt states RN recently emptied it), appropriated charged, mild erythema around insertion.  Skin:    General: Skin is warm and dry.  Neurological:     Mental Status: He is alert and oriented to person, place, and time.  Psychiatric:        Mood and Affect: Mood normal.        Behavior: Behavior normal.        Thought Content: Thought content normal.        Judgment: Judgment normal.     Imaging: DG Humerus Left Result Date: 10/13/2023 CLINICAL DATA:  Left upper extremity amputation through the humerus. Multiple myeloma. EXAM: LEFT HUMERUS - 2+ VIEW COMPARISON:  09/15/2023 FINDINGS: A single C-arm image demonstrates the recently demonstrated lytic changes in the distal humerus and associated fixation hardware. IMPRESSION: Single C-arm image of the distal humerus and associated fixation hardware. Electronically Signed   By: Elspeth Bathe M.D.   On: 10/13/2023 11:05   DG C-Arm 1-60 Min-No Report Result Date: 10/13/2023 Fluoroscopy was utilized by the requesting physician.  No radiographic interpretation.   DG C-Arm 1-60 Min-No Report Result Date: 10/13/2023 Fluoroscopy was utilized by the requesting physician.  No radiographic interpretation.   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 Planned sclerosing. EXAM: Sclerosing of abnormal fluid collection COMPARISON:  None Available. MEDICATIONS: 500 mg doxycycline ; intra procedural ANESTHESIA/SEDATION: Moderate (conscious) sedation was employed during this procedure. A total of Versed  2 mg and Fentanyl  100 mcg was administered intravenously by the radiology nurse. Total intra-service moderate Sedation Time: 11 minutes. The patient's level of consciousness and vital signs were monitored continuously by radiology nursing throughout the procedure under my direct supervision. CONTRAST:  10 mL Omnipaque   300-administered into the collecting system(s) FLUOROSCOPY: Radiation Exposure Index (as provided by the fluoroscopic device): 5 mGy Kerma COMPLICATIONS: None immediate. PROCEDURE: Informed written consent was obtained from the patient after a thorough discussion of the procedural risks, benefits and alternatives. All questions were addressed. Maximal Sterile Barrier Technique was utilized including caps, mask, sterile gowns, sterile gloves, sterile drape, hand hygiene and skin antiseptic. A timeout was performed prior to the initiation of the procedure. The indwelling catheter in the right hip region prepped and draped in usual sterile fashion. The indwelling 10 French catheter was aggressively irrigated with normal saline and flushed. Dilute contrast was then injected under fluoroscopic guidance into the catheter to evaluate if the fluid collection was communicating with vascular or lymphatic structure. Contrast flows freely into the catheter and outlines the fluid collection which is irregular in appearance, however there is no vascular or lymphatic communication identified. The contrast was then mostly withdrawn. The catheter and retention sutures were cut. A new 10 French catheter was then advanced over a guidewire into the fluid collection and repositioned in a more cephalad location. Approximately 500 mg of doxycycline  was reconstituted with 50 mL of normal saline and then approximately 35 mL of this mixture was injected into the 10 French pigtail catheter. The catheter was then capped. Retention suture and sterile dressing were applied. The doxycycline  will dwell within the cavity for approximately 1 hour and then the catheter will be connected to a JP bulb and the patient will be monitored for output. IMPRESSION: Satisfactory exchange of a 10 French right hip drainage catheter with sclerosing  using doxycycline  as described above. Catheter will remain to a JP bulb until output has sufficiently diminished.  Electronically Signed   By: Cordella Banner   On: 10/10/2023 17:18   CT PELVIS WO CONTRAST Result Date: 10/07/2023 CLINICAL DATA:  Right hip drain evaluation. EXAM: CT PELVIS WITHOUT CONTRAST TECHNIQUE: Multidetector CT imaging of the pelvis was performed following the standard protocol without intravenous contrast. RADIATION DOSE REDUCTION: This exam was performed according to the departmental dose-optimization program which includes automated exposure control, adjustment of the mA and/or kV according to patient size and/or use of iterative reconstruction technique. COMPARISON:  CT abdomen pelvis dated 07/08/2023. FINDINGS: Urinary Tract: The visualized ureters appear unremarkable. Punctate stone noted in the posterior bladder lumen. The urinary bladder is otherwise unremarkable. Bowel:  Moderate stool in the colon.  The appendix is normal. Vascular/Lymphatic: Moderate aortoiliac atherosclerotic disease. No pelvic adenopathy. Reproductive: The prostate and seminal vesicles are grossly unremarkable. Other: Right gluteal pigtail drainage catheter. Significant interval decrease in the size of the hematoma in the right gluteal muscle now measuring approximately 4.7 x 9.3 cm. Additional loculated collection in the anterior upper right thigh significantly decreased in size since the prior CT and measures approximately 10 x 17 cm in greatest axial dimensions. Musculoskeletal: Osteopenia with degenerative changes of the spine. Total left hip arthroplasty. Scattered lucent lesions throughout the bones as seen on the prior CT. IMPRESSION: 1. Right gluteal pigtail drainage catheter with significant interval decrease in the size of the hematoma in the right gluteal muscle. 2. Significant interval decrease in the size of the loculated collection in the anterior upper right thigh. 3. Punctate stone in the posterior bladder lumen. 4.  Aortic Atherosclerosis (ICD10-I70.0). Electronically Signed   By: Vanetta Chou M.D.    On: 10/07/2023 16:53   DG CHEST PORT 1 VIEW Result Date: 10/06/2023 EXAM: 1 VIEW XRAY OF THE CHEST 10/06/2023 08:52:00 PM COMPARISON: 09/28/2023 CLINICAL HISTORY: 141880 SOB (shortness of breath) 141880. sob FINDINGS: LUNGS AND PLEURA: Increased interstitial markings, left greater than right, favoring atypical infection / pneumonia over interstitial edema. No pleural effusion. No pneumothorax. HEART AND MEDIASTINUM: No acute abnormality of the cardiac and mediastinal silhouettes. BONES AND SOFT TISSUES: Degenerative changes of the bilateral shoulders. No acute osseous abnormality. IMPRESSION: 1. Increased interstitial markings, left greater than right, favoring atypical infection/pneumonia over interstitial edema. Electronically signed by: Pinkie Pebbles MD 10/06/2023 08:58 PM EDT RP Workstation: [id number]   CT FEMUR RIGHT WO CONTRAST Result Date: 10/03/2023 CLINICAL DATA:  Right thigh fluid collection, drain maintenance EXAM: CT OF THE LOWER RIGHT EXTREMITY WITHOUT CONTRAST TECHNIQUE: Multidetector CT imaging of the right lower extremity was performed according to the standard protocol. RADIATION DOSE REDUCTION: This exam was performed according to the departmental dose-optimization program which includes automated exposure control, adjustment of the mA and/or kV according to patient size and/or use of iterative reconstruction technique. COMPARISON:  09/24/2023 FINDINGS: Bones/Joint/Cartilage Stable scattered bone lucencies, see description from the 09/24/2023 exam. Stable right hip arthropathy. Prominent osteoarthritis of the right knee. Left total knee prosthesis partially observed. Large spur like projections from the inferior patella extending into the joint. Moderate knee effusion possibly with synovitis. Suspected free osteochondral fragments in the knee joint. Ligaments N/A Muscles and Tendons Semimembranosus muscular atrophy bilaterally. Soft tissues Reduced size of the fluid collection along the  superficial fascia margin of the tensor fascia lata and rectus femoris, currently measuring about 20.9 by 3.0 by 11.8 cm (volume = 390 cm^3). Previous total volume cannot be calculated  as this was not completely included, but previous total volume was probably about 3 times is much. Surrounding subcutaneous edema along the right lateral thigh tracking down to the knee. There is also some subcutaneous edema medially in both thighs. Edema tracks along the superficial fascia margin of the posterior calf musculature in the upper calf region. Prominence of stool in visualized colon, cannot exclude constipation. Atherosclerosis noted. IMPRESSION: 1. Reduced size of the fluid collection along the superficial fascia margin of the tensor fascia lata and rectus femoris, currently measuring about 20.9 by 3.0 by 11.8 cm (volume = 390 cc). Previous total volume cannot be calculated as this was not completely included, but previous total volume was probably about 3 times is much. 2. Surrounding subcutaneous edema along the right lateral thigh tracking down to the knee. There is also some subcutaneous edema medially in both thighs. Edema tracks along the superficial fascia margin of the posterior calf musculature in the upper calf region. 3. Prominent osteoarthritis of the right knee. Moderate knee effusion possibly with synovitis. Suspected free osteochondral fragments in the knee joint. 4. Prominence of stool in visualized colon, cannot exclude constipation. 5. Atherosclerosis. 6. Stable scattered bone lucencies, see description from the 09/24/2023 exam. Electronically Signed   By: Ryan Salvage M.D.   On: 10/03/2023 08:21   VAS US  LOWER EXTREMITY VENOUS (DVT) Result Date: 10/01/2023  Lower Venous DVT Study Patient Name:  Cobe Viney.  Date of Exam:   10/01/2023 Medical Rec #: [id number]           Accession #:    [id number] Date of Birth: [birth year]           Patient Gender: M Patient Age:   67 years Exam Location:   [facility] Procedure:      VAS US  LOWER EXTREMITY VENOUS (DVT) Referring Phys: DELILIAH RASHID --------------------------------------------------------------------------------  Indications: Right thigh swelling, erythema, pain.  Risk Factors: Extended hospitalization with immobility (3 months). Limitations: Poor ultrasound/tissue interface. Comparison Study: LLEV on 07/18/2023 was negative for DVT. Performing Technologist: Ezzie Potters RVT, RDMS  Examination Guidelines: A complete evaluation includes B-mode imaging, spectral Doppler, color Doppler, and power Doppler as needed of all accessible portions of each vessel. Bilateral testing is considered an integral part of a complete examination. Limited examinations for reoccurring indications may be performed as noted. The reflux portion of the exam is performed with the patient in reverse Trendelenburg.  +---------+---------------+---------+-----------+----------+-------------------+ RIGHT    CompressibilityPhasicitySpontaneityPropertiesThrombus Aging      +---------+---------------+---------+-----------+----------+-------------------+ CFV      Full           Yes      Yes                                      +---------+---------------+---------+-----------+----------+-------------------+ SFJ      Full                                                             +---------+---------------+---------+-----------+----------+-------------------+ FV Prox  Full           Yes      Yes                                      +---------+---------------+---------+-----------+----------+-------------------+  FV Mid   Full           Yes      Yes                                      +---------+---------------+---------+-----------+----------+-------------------+ FV DistalFull           Yes      Yes                                      +---------+---------------+---------+-----------+----------+-------------------+ PFV      Full                                                              +---------+---------------+---------+-----------+----------+-------------------+ POP      Full           Yes      Yes                                      +---------+---------------+---------+-----------+----------+-------------------+ PTV      None           No       No                   Age indeterminate                                                         (one of paired)     +---------+---------------+---------+-----------+----------+-------------------+ PERO     Full                                                             +---------+---------------+---------+-----------+----------+-------------------+   +---------+---------------+---------+-----------+----------+--------------+ LEFT     CompressibilityPhasicitySpontaneityPropertiesThrombus Aging +---------+---------------+---------+-----------+----------+--------------+ CFV      None           No       No                   Acute          +---------+---------------+---------+-----------+----------+--------------+ SFJ      Partial                                      Acute          +---------+---------------+---------+-----------+----------+--------------+ FV Prox  Full           Yes      Yes                                 +---------+---------------+---------+-----------+----------+--------------+ FV Mid  Full           Yes      Yes                                 +---------+---------------+---------+-----------+----------+--------------+ FV DistalFull           Yes      Yes                                 +---------+---------------+---------+-----------+----------+--------------+ PFV      None           No       No                                  +---------+---------------+---------+-----------+----------+--------------+ POP      Full           Yes      Yes                                  +---------+---------------+---------+-----------+----------+--------------+ PTV      Full                                                        +---------+---------------+---------+-----------+----------+--------------+ PERO     Full                                                        +---------+---------------+---------+-----------+----------+--------------+ Proximal CFV compresses, has color, and phasic flow. Mid and distal CFV have occlusive thrombus    Summary: BILATERAL: -No evidence of popliteal cyst, bilaterally. RIGHT: - Findings consistent with age indeterminate deep vein thrombosis involving the right posterior tibial veins.   LEFT: - Findings consistent with acute deep vein thrombosis involving the left common femoral vein, SF junction, and left femoral vein.  - The common femoral vein obstruction does not appear to extend above inguinal ligament.  *See table(s) above for measurements and observations. Electronically signed by Gaile New MD on 10/01/2023 at 5:11:34 PM.    Final    IR GASTROSTOMY TUBE REMOVAL Result Date: 10/01/2023 INDICATION: Gastrostomy tube is no longer needed. EXAM: GASTROSTOMY TUBE REMOVAL MEDICATIONS: None ANESTHESIA/SEDATION: None CONTRAST:  None FLUOROSCOPY: None COMPLICATIONS: None immediate. PROCEDURE: Informed written consent was obtained from the patient after a thorough discussion of the procedural risks, benefits and alternatives. All questions were addressed. A timeout was performed prior to the initiation of the procedure. Balloon retention 18 French gastrostomy tube was intact. 5 cc syringe was used to remove 5 cc of saline from balloon. Gastrostomy tube was removed without difficulty. Site was cleaned and dressed appropriately. IMPRESSION: Successful removal of 18 French balloon retention gastrostomy tube. Performed by Carlin Griffon, PA-C Electronically Signed   By: Juliene Balder M.D.   On: 10/01/2023 16:40   US  RT LOWER EXTREM LTD SOFT TISSUE NON  VASCULAR Result Date: 09/30/2023 CLINICAL DATA:  Evaluate for abscess. EXAM:  ULTRASOUND RIGHT LOWER EXTREMITY LIMITED TECHNIQUE: Ultrasound examination of the lower extremity soft tissues was performed in the area of clinical concern. COMPARISON:  None Available. FINDINGS: In the region of palpable abnormality in the lateral aspect of the right thigh there is a well-circumscribed is 8.5 x 2.3 x 6.8 cm fluid collection in the subcutaneous tissues. This is 1.8 cm deep to the skin. There is no abnormal vascularity. There is no soft tissue mass. IMPRESSION: 8.5 cm fluid collection in the subcutaneous tissues of the lateral aspect of the right thigh. This may represent a seroma or abscess. Electronically Signed   By: Greig Pique M.D.   On: 09/30/2023 21:47   DG Knee 1-2 Views Right Result Date: 09/29/2023 CLINICAL DATA:  Knee pain and swelling EXAM: RIGHT KNEE - 1-2 VIEW COMPARISON:  X-ray 01/18/2019 FINDINGS: Moderate joint effusion. Moderate joint space loss of the medial compartment and patellofemoral joint. Tricompartmental osteophytes are seen greatest of the patellofemoral joint. Chondrocalcinosis also identified. There is medial translation of the femur relative to the tibia as well. Preserved bone mineralization. IMPRESSION: Advanced tricompartmental degenerative changes. Moderate joint effusion. Chondrocalcinosis Electronically Signed   By: Ranell Bring M.D.   On: 09/29/2023 15:57   DG Ribs Unilateral Left Result Date: 09/28/2023 CLINICAL DATA:  Left-sided rib pain after cough EXAM: LEFT RIBS - 2 VIEW COMPARISON:  Chest x-ray 09/28/2023.  Chest CT 07/26/2023. FINDINGS: Expansile anterolateral left 6 rib lesion is again noted. No displaced fractures are identified. Lungs are grossly clear. IMPRESSION: Expansile anterolateral left 6 rib lesion is again noted. Correlate for point tenderness. No displaced fractures are identified. Electronically Signed   By: Greig Pique M.D.   On: 09/28/2023 21:50   DG  CHEST PORT 1 VIEW Result Date: 09/28/2023 CLINICAL DATA:  Chest pain.  Attention left ribs. EXAM: PORTABLE CHEST 1 VIEW COMPARISON:  08/23/2023, CT 07/26/2023 FINDINGS: Removal of right-sided dialysis catheter and tracheostomy tube. Low lung volumes persist. Ill-defined opacity at the left lung base likely pleural effusion and airspace disease/atelectasis. Stable heart size and mediastinal contours. Vascular congestion. No pneumothorax. Chronic bilateral shoulder arthropathy. Lucent lesions within the thoracic osseous structures on prior CT, including left rib, not well demonstrated by radiograph. IMPRESSION: 1. Ill-defined opacity at the left lung base likely pleural effusion and airspace disease/atelectasis. 2. Vascular congestion. 3. Lucent lesions within the thoracic osseous structures on prior CT, including left rib, are not well demonstrated by radiograph. Electronically Signed   By: Andrea Gasman M.D.   On: 09/28/2023 16:40   US  IMAGE GUIDED FLUID DRAIN BY CATHETER Result Date: 09/26/2023 CLINICAL DATA:  Large right gluteal fluid collection likely evolving hematoma or potential abscess EXAM: US  IMAGE GUIDED FLUID DRAIN BY CATHETER TECHNIQUE: Ultrasound guidance COMPARISON:  None Available. FINDINGS: With the patient in a supine position, the right gluteal region and hip region were evaluated with ultrasound. A large fluid collection corresponding with previous visualized fluid collection is again seen. The patient was prepped and draped in usual sterile fashion. Local anesthesia was achieved by infiltrating subcutaneous tissue with 1% lidocaine . A small incision was made and then a 7 cm Yueh needle was advanced under ultrasound guidance from the incision to midpoint of the fluid collection. The needle was removed and short Amplatz wire was advanced under ultrasound guidance and coiled within the fluid collection. The Yueh catheter was then removed and the access site was dilated with a 10 French  fascial dilator. A 10 French pigtail catheter was then advanced over the  guidewire and coiled within the fluid collection. Locking mechanism engaged. Retention suture and sterile dressing applied. Small sample was obtained and sent to pathology for aerobic and anaerobe evaluation. IMPRESSION: Satisfactory 10 French drainage catheter placed in a right hip abscess. Fluid appears to be infected hematoma. Electronically Signed   By: Cordella Banner   On: 09/26/2023 10:41   CT HEAD WO CONTRAST ( ) Result Date: 09/25/2023 CLINICAL DATA:  Initial evaluation for acute neuro deficit, stroke suspected. EXAM: CT HEAD WITHOUT CONTRAST TECHNIQUE: Contiguous axial images were obtained from the base of the skull through the vertex without intravenous contrast. RADIATION DOSE REDUCTION: This exam was performed according to the departmental dose-optimization program which includes automated exposure control, adjustment of the mA and/or kV according to patient size and/or use of iterative reconstruction technique. COMPARISON:  CT from 09/22/2023. FINDINGS: Brain: Cerebral volume within normal limits. Patchy hypodensity involving the supratentorial cerebral white matter, most characteristic of chronic microvascular ischemic disease, moderate in nature. No acute intracranial hemorrhage. No acute large vessel territory infarct. No mass lesion, midline shift or mass effect. No hydrocephalus or extra-axial fluid collection. Vascular: No abnormal hyperdense vessel. Calcified atherosclerosis present at the skull base. Skull: Scalp soft tissues demonstrate no acute finding. Multiple lucent lesions again noted throughout the calvarium and visualized skull base, consistent with history of multiple myeloma. Sinuses/Orbits: Globes orbital soft tissues within normal limits. Mild mucosal thickening noted about the right maxillary sinus. Minimal pneumatized secretions noted within the right sphenoid sinus. Large right mastoid and middle ear  effusion, with small left mastoid effusion. Imaged nasopharynx grossly unremarkable. Other: None. IMPRESSION: 1. No acute intracranial abnormality. 2. Moderate chronic microvascular ischemic disease. 3. Multiple lucent lesions throughout the calvarium and visualized skull base, consistent with history of multiple myeloma. 4. Large right mastoid and middle ear effusion, with small left mastoid effusion. Findings of uncertain significance, however, correlation with physical exam for possible acute otomastoiditis recommended. Electronically Signed   By: Morene Hoard M.D.   On: 09/25/2023 20:46   CT HIP RIGHT WO CONTRAST Result Date: 09/25/2023 CLINICAL DATA:  Right hip pain EXAM: CT OF THE RIGHT HIP WITHOUT CONTRAST TECHNIQUE: Multidetector CT imaging of the right hip was performed according to the standard protocol. Multiplanar CT image reconstructions were also generated. RADIATION DOSE REDUCTION: This exam was performed according to the departmental dose-optimization program which includes automated exposure control, adjustment of the mA and/or kV according to patient size and/or use of iterative reconstruction technique. COMPARISON:  CT right femur 08/01/2023 FINDINGS: Bones/Joint/Cartilage Moderate to prominent axial and craniocaudad chondral thinning in the right hip associated with substantial spurring of the right femoral head. Mildly aspherical femoral head morphology may predispose to cam type femoroacetabular impingement. Scattered degenerative subcortical cystic lesions along the right femoral head and acetabulum. Scattered lucencies in the marrow of the visualized bony structures including the right iliac bone, ischium, and right proximal femur. Entities such as myeloma are not excluded. Similar lesions have been reported in other bony structures example in cranial structures on the MRI head from 07/26/2023. Ligaments Suboptimally assessed by CT. Muscles and Tendons Moderate regional muscular  atrophy. Soft tissues Extending partially along the superficial fascia margin of the gluteus medius and rectus femoris muscle, there is a large fluid collection which extends above and below the margins of today's CT scan. This measures 12.9 by 6.3 cm in cross-section on image 93 of series 5 and was also present on 08/01/2023, tracking cephalad on that exam all the way up  to the level of the iliac crests. Appearance favors chronic hematoma or possibly a Morel-Lavalle lesion given the lack of resolution. Right iliac artery atherosclerosis. IMPRESSION: 1. 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 Mildly aspherical femoral head morphology may predispose to cam type femoroacetabular impingement. 2. Scattered lucencies in the marrow of the visualized bony structures including the right iliac bone, ischium, and right proximal femur. Entities such as myeloma are not excluded. Similar lesions have been reported in other bony structures example in cranial structures on the MRI head from 07/26/2023. 3. Large fluid collection extending partially along the superficial fascia margin of the gluteus medius and rectus femoris muscle, also present on 08/01/2023, tracking cephalad on that exam all the way up to the level of the iliac crests. Appearance favors chronic hematoma or possibly a Morel-Lavallee lesion given the lack of resolution. 4. Moderate regional muscular atrophy. 5. Right iliac artery atherosclerosis. Electronically Signed   By: Ryan Salvage M.D.   On: 09/25/2023 08:00   CT HEAD WO CONTRAST ( ) Result Date: 09/23/2023 EXAM: CT HEAD WITHOUT CONTRAST 09/22/2023 09:56:16 PM TECHNIQUE: CT of the head was performed without the administration of intravenous contrast. Automated exposure control, iterative reconstruction, and/or weight based adjustment of the mA/kV was utilized to reduce the radiation dose to as low as reasonably achievable. COMPARISON: None available. CLINICAL HISTORY: Transient  ischemic attack (TIA). FINDINGS: BRAIN AND VENTRICLES: No acute hemorrhage. Gray-white differentiation is preserved. No hydrocephalus. No extra-axial collection. No mass effect or midline shift. Periventricular and scattered subcortical white matter hypoattenuation is moderately advanced for age, stable. Mild generalized atrophy is stable. ORBITS: No acute abnormality. SINUSES: Bilateral mastoid effusions are again noted, right greater than left. Minimal right maxillary sinus disease is again seen. Secretions are present within the right sphenoid sinus. SOFT TISSUES AND SKULL: No acute soft tissue abnormality. No skull fracture. A lytic lesion is again noted within a right greater than left clivus. Multiple smaller lytic lesions are present throughout the calvarium, stable. VASCULATURE: Atherosclerotic calcifications are present in the cavernous carotid arteries bilaterally. No hyperdense vessel is present. IMPRESSION: 1. No acute intracranial abnormality related to the clinical history of TIA. 2. Moderately advanced periventricular and scattered subcortical white matter hypoattenuation, stable. 3. Mild generalized atrophy, stable. 4. Bilateral mastoid effusions, right greater than left, and minimal right maxillary sinus disease, again seen. Secretions are present within the right sphenoid sinus. 5. Lytic lesion within a right greater than left clivus and multiple smaller lytic lesions throughout the calvarium, stable. Electronically signed by: Lonni Necessary MD 09/23/2023 05:20 AM EDT RP Workstation: [id number]   DG Humerus Left Result Date: 09/15/2023 CLINICAL DATA:  Myeloma. EXAM: LEFT HUMERUS - 2+ VIEW COMPARISON:  09/01/2023 FINDINGS: Plate and screw fixation of distal humerus. Diffuse bony demineralization of the distal humerus with innumerable cystic lucencies, without significant interval change. Suspected pathologic fracture fixated by a hardware. No new fracture. The proximal humerus is intact.  IMPRESSION: Stable radiographic appearance of the distal humerus with plate and screw fixation. Diffusely decreased bone mineralization with innumerable cystic lucencies distally, grossly unchanged from prior. Given provided history of myeloma this is likely due to diffuse myomatous change, although infection is not excluded on imaging findings alone. Electronically Signed   By: Andrea Gasman M.D.   On: 09/15/2023 15:23    Labs:  CBC: Recent Labs    10/10/23 0420 10/11/23 0328 10/12/23 0533 10/13/23 0610  WBC 5.5 5.6 6.1 5.7  HGB 10.5* 9.3* 9.9* 10.3*  HCT 32.5*  29.1* 30.8* 32.3*  PLT 417* 387 378 371    COAGS: Recent Labs    07/05/23 2227 07/07/23 2030 07/10/23 0411 07/26/23 0423 08/13/23 0908 10/02/23 1456 10/02/23 2305 10/03/23 0719  INR 2.1* 1.9*  --   --  1.0  --   --   --   APTT  --  44*   < > 36  --  43* 39* 52*   < > = values in this interval not displayed.    BMP: Recent Labs    10/05/23 0533 10/06/23 0614 10/07/23 0638 10/12/23 0533  NA 133* 134* 132* 131*  K 4.7 5.3* 4.6 5.1  CL 99 100 101 101  CO2 24 24 22  20*  GLUCOSE 85 94 87 126*  BUN 27* 22* 23* 24*  CALCIUM  8.4* 8.6* 8.6* 8.3*  CREATININE 1.03 1.01 1.24 1.40*  GFRNONAA >60 >60 >60 58*    LIVER FUNCTION TESTS: Recent Labs    10/04/23 0328 10/05/23 0533 10/06/23 0614 10/07/23 0638 10/12/23 0533  BILITOT 0.6 0.2 0.4 0.5  --   AST 9* 8* 10* 9*  --   ALT 6 6 7 5   --   ALKPHOS 78 84 89 83  --   PROT 5.2* 5.3* 5.5* 5.5*  --   ALBUMIN  2.0* 2.0* 2.2* 2.1* 2.3*    TUMOR MARKERS: No results for input(s): AFPTM, CEA, CA199, CHROMGRNA in the last 8760 hours.  Assessment and Plan:  Drain Location: R hip Size: Fr size: 10 Fr Date of placement: 7/11; 7/25 exchange Dr. Jenna  Currently to: Drain collection device: suction bulb 24 hour output:  Output by Drain (mL) 10/11/23 0701 - 10/11/23 1900 10/11/23 1901 - 10/12/23 0700 10/12/23 0701 - 10/12/23 1900 10/12/23 1901 - 10/13/23  0700 10/13/23 0701 - 10/13/23 1655  Closed System Drain 1 Right Hip Bulb (JP) 10 Fr. 50 80  50     Interval imaging/drain manipulation:  7/25 drain exchange and sclerotherapy  Current examination: Flushes easily. Does not asp, but there is copious output in collection. Insertion site unremarkable. Suture and stat lock in place. Dressed appropriately.   Plan:  Patient continues to put out 50-100cc/day from R hip drain. Will continue to round and monitor output. It is possible that patient may need additional sclerotherapy in IR. No positioning concerns at this time. Please notify IR prior to DC.  Thank you for allowing our service to participate in Hairo Kwaku Mostafa. 's care.    Electronically Signed: Laymon Coast, NP   10/13/2023, 4:46 PM     I spent a total of 20 Minutes    in face to face in clinical consultation, greater than 50% of which was counseling/coordinating care for existing drain evaluation (r hip drain).    (A copy of this note was sent to the referring provider and the time of visit.)

## 2023-10-13 NOTE — Transfer of Care (Signed)
 Immediate Anesthesia Transfer of Care Note  Patient: Frank [name].  Procedure(s) Performed: AMPUTATION, UPPER EXTREMITY, THROUGH HUMERUS (Left)  Patient Location: PACU  Anesthesia Type:General  Level of Consciousness: awake, alert , and oriented  Airway & Oxygen Therapy: Patient Spontanous Breathing and Patient connected to nasal cannula oxygen  Post-op Assessment: Report given to RN and Post -op Vital signs reviewed and stable  Post vital signs: Reviewed and stable  Last Vitals:  Vitals Value Taken Time  BP    Temp    Pulse 110 10/13/23 10:12  Resp 26 10/13/23 10:12  SpO2 99 % 10/13/23 10:12  Vitals shown include unfiled device data.  Last Pain:  Vitals:   10/13/23 0655  TempSrc:   PainSc: 6       Patients Stated Pain Goal: 1 (10/13/23 0655)  Complications: No notable events documented.

## 2023-10-13 NOTE — Plan of Care (Signed)
  Problem: Education: Goal: Knowledge of General Education information will improve Description: Including pain rating scale, medication(s)/side effects and non-pharmacologic comfort measures Outcome: Progressing   Problem: Education: Goal: Knowledge of General Education information will improve Description: Including pain rating scale, medication(s)/side effects and non-pharmacologic comfort measures Outcome: Progressing   Problem: Clinical Measurements: Goal: Ability to maintain clinical measurements within normal limits will improve Outcome: Progressing Goal: Will remain free from infection Outcome: Progressing Goal: Diagnostic test results will improve Outcome: Progressing Goal: Respiratory complications will improve Outcome: Progressing Goal: Cardiovascular complication will be avoided Outcome: Progressing

## 2023-10-13 NOTE — Progress Notes (Signed)
 PROGRESS NOTE  Frank [name]. FMW:[id number] DOB: 22-Aug-1965   PCP: [name], MD  Patient is from: Home.  DOA: 07/03/2023 LOS: 102  Chief complaints No chief complaint on file.    Brief Narrative / Interim history: 58 year old PMH OSA CPAP, pathological fracture and multiple ORIF left Humerus likely due to multiple myeloma, A-fib not on anticoagulation, hyperlipidemia, tobacco use disorder who was feeling weak for 1 week, admitted to [facility] health with concern of sepsis, community-acquired pneumonia and acute encephalopathy. Patient was found to have lytic lesions, he was transferred to [facility] for oncology treatment. In the meantime he developed encephalopathy, acute renal failure. He remained in the ICU for 36 days. Underwent tracheostomy. He was also started on HD after CRRT,off of HD now. Subsequently PEG tube placed 5/29 and IR drain placement of right thigh hip infected hematoma on 7/11 . S/p PEG tube removal. Needs Port-A-Cath placement (for outpatient chemo), either during this hospitalization or as an outpatient. F/u IR recommendations for drain management. Started on heparin  drip on 7/17 because of b/l LE DVT.  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 on 7/28.  Surgical EBL 750 cc.  Subjective: Seen and examined earlier this morning after he returned from surgery.  Patient's wife at bedside.  He reports some pain in his left upper back.  No other complaints.  Objective: Vitals:   10/13/23 0500 10/13/23 1015 10/13/23 1030 10/13/23 1045  BP:  (!) 126/100 (!) 124/97 (!) 123/103  Pulse:  (!) 110 (!) 104 (!) 104  Resp:  18 (!) 29 17  Temp:  98 F (36.7 C)  98 F (36.7 C)  TempSrc:      SpO2:  98% 90% 97%  Weight: 107 kg     Height:        Examination:  GENERAL: No apparent distress.  Nontoxic. HEENT: MMM.  Vision and hearing grossly intact.  NECK: Supple.  No apparent JVD.  RESP:  No IWOB.  Fair aeration bilaterally. CVS:   RRR. Heart sounds normal.  ABD/GI/GU: BS+. Abd soft, NTND.  MSK/EXT:  Moves extremities.  Left arm amputation.  Dressing DCI. SKIN: no apparent skin lesion or wound NEURO: Sleepy but wakes to voice.  Oriented x 4.  BLE weakness. PSYCH: Calm. Normal affect.   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 on 7/28. -Pain fairly controlled.  Infected right flank/gluteal hematoma -Underwent IR drain placement for infected Hematoma on 7/11.  -Drain exchanged on 7/25. -Completed antibiotic course on 7/26   Sepsis in the setting of Staph Epi Bacteremia -blood culture with MRSE in 1 out of 4 bottles.  Completed a course of vancomycin  with the end date 6/20.  He did receive a line holiday.  Repeat blood culture 6/9 without growth.  Completed thereabouts on 7/26   Multiple myeloma: Started chemo with Velcade /Cytoxan  on 08/21/2023.  Chemo changed to Sarclisa /Kyprolis /Cytoxan  later on.  Currently on hold pending left arm amputation.   -Needs Port-A-cath placement, likely closer to discharge or as an outpatient. -Myeloma responded well to treatment, IgG decreased from 11,507 to WNL 1470. -Continue acyclovir   -Oncology, Dr [name] following.  Next treatment likely after surgery.   AKI on CKD-3A/uremia: due to MM, hypercalcemia and ACE inhibitors?  Briefly required hemodialysis.  AKI resolved and TDC removed.  Cr  slightly up today. - Continue monitoring  BLE DVT: US  venous doppler on 7/16 positive for DVT in his RLE PTV, left CFV, SFJ, and PFV. -Continue heparin  infusion.  Will transition to DOAC after surgery.  Left sided pleuritic chest wall pain:  No rib fractures seen on CXR - Analgesics as needed.   Aspergillosis with pneumonia  -Continue voriconazole .  End date on  8/5.   Acute Hypoxic Respiratory Failure-trach removed 6/11.  Decannulated.   Severe acute metabolic encephalopathy with hypoactive delirium, seizure disorder - Normal MRI of the brain.  LP negative for meningitis or encephalitis.  EEG negative for seizures but encephalopathy.  -Resolved   IDDM-2 with hyperglycemia- A1c 6.0%. -Continue with sliding scale insulin .    Paroxysmal A-fib:: Rate controlled. -Continue home verapamil  -On IV heparin  for anticoagulation  Essential hypertension: Normotensive. - Continue home verapamil    Obstructive Sleep Apnea -Trach removed. CPAP    Anxiety/Bipolar disorder - Continue with Paxil .     Hypokalemia/hyperkalemia: Resolved.  Hyponatremia: Mild and stable.  Dysphagia: Resolved.  PEG tube removed.  On regular diet.  Severe malnutrition/class I obesity Body mass index is 31.99 kg/m. Nutrition Problem: Severe Malnutrition Etiology: acute illness (prolonged illness, interuptions in feeding) Signs/Symptoms: moderate fat depletion, moderate muscle depletion, percent weight loss (9.6% x 1 month) Percent weight loss: 9.6 % Interventions: Ensure Enlive (each supplement provides 350kcal and 20 grams of protein), MVI, Liberalize Diet, Prostat, Juven  Pressure skin injury: Pressure Injury 07/05/23 Heel Left;Right Unstageable - Full thickness tissue loss in which the base of the injury is covered by slough (yellow, tan, gray, green or brown) and/or eschar (tan, brown or black) in the wound bed. (Active)  07/05/23 1108  Location: Heel  Location Orientation: Left;Right  Staging: Unstageable - Full thickness tissue loss in which the base of the injury is covered by slough (yellow, tan, gray, green or brown) and/or eschar (tan, brown or black) in the wound bed.  Wound Description (Comments):   DO NOT USE:  Present on Admission: Yes  Dressing Type None 10/12/23 1935     Pressure Injury 07/11/23 Sacrum Mid Unstageable - Full thickness tissue loss in which  the base of the injury is covered by slough (yellow, tan, gray, green or brown) and/or eschar (tan, brown or black) in the wound bed. (Active)  07/11/23 1600  Location: Sacrum  Location Orientation: Mid  Staging: Unstageable - Full thickness tissue loss in which the base of the injury is covered by slough (yellow, tan, gray, green or brown) and/or eschar (tan, brown or black) in the wound bed.  Wound Description (Comments):   DO NOT USE:  Present on Admission: No  Dressing Type Gauze (Comment);Santyl ;Barrier Film (skin prep);Foam - Lift dressing to assess site every shift 10/07/23 1400     Pressure Injury 07/27/23 Buttocks Right;Lower (Active)  07/27/23 1700  Location: Buttocks  Location Orientation: Right;Lower  Staging:   Wound Description (Comments):   DO NOT USE:  Present on Admission: Yes  Dressing Type None 10/13/23 0515     Pressure Injury 08/25/23 Buttocks Left Stage 2 -  Partial thickness loss of dermis presenting as a shallow open injury with a red, pink wound bed without slough. (Active)  08/25/23   Location: Buttocks  Location Orientation: Left  Staging: Stage 2 -  Partial thickness loss of dermis presenting as a shallow open injury with a red, pink wound bed without slough.  Wound Description (Comments):   DO NOT  USE:  Present on Admission: No  Dressing Type None 10/13/23 0515   DVT prophylaxis:  SCDs Start: 10/13/23 1123 Place and maintain sequential compression device Start: 07/14/23 1033  Code Status: Full code Family Communication: Updated patient's wife at bedside. Level of care: Telemetry Medical Status is: Inpatient due to acute illness   Final disposition: SNF   35 minutes with more than 50% spent in reviewing records, counseling patient/family and coordinating care.   Sch Meds:  Scheduled Meds:  (feeding supplement) PROSource Plus  30 mL Oral TID BM   acyclovir   400 mg Oral BID   collagenase    Topical Daily   docusate sodium   100 mg Oral BID    epoetin  alfa  40,000 Units Subcutaneous Q Wed-1800   feeding supplement  237 mL Oral BID WC   ferrous sulfate   325 mg Oral Q breakfast   guaiFENesin   600 mg Oral BID   hydrALAZINE   25 mg Oral Q8H   insulin  aspart  0-15 Units Subcutaneous TID AC & HS   insulin  aspart  2 Units Subcutaneous TID AC & HS   insulin  glargine-yfgn  10 Units Subcutaneous Q24H   lidocaine   1 patch Transdermal Q24H   montelukast   5 mg Oral QHS   multivitamin with minerals  1 tablet Oral Daily   nutrition supplement (JUVEN)  1 packet Oral BID AC   mouth rinse  15 mL Mouth Rinse 4 times per day   pantoprazole   40 mg Oral BID   PARoxetine   40 mg Oral Daily   polyethylene glycol  17 g Oral BID   senna  1 tablet Oral Daily   sodium chloride  flush  10-40 mL Intracatheter Q12H   umeclidinium-vilanterol  1 puff Inhalation Daily   valproic  acid  750 mg Oral BID   verapamil   120 mg Oral Daily   voriconazole   400 mg Oral Q12H   Continuous Infusions:  sodium chloride  Stopped (10/13/23 0952)   sodium chloride  Stopped (08/28/23 9160)   heparin  Stopped (10/13/23 0500)   PRN Meds:.acetaminophen  (TYLENOL ) oral liquid 160 mg/5 mL **OR** acetaminophen , alum & mag hydroxide-simeth, benzonatate , cyclobenzaprine , HYDROmorphone  (DILAUDID ) injection, hydrOXYzine , ipratropium-albuterol , metoCLOPramide  **OR** metoCLOPramide  (REGLAN ) injection, ondansetron  (ZOFRAN ) IV, oxyCODONE , polyethylene glycol, artificial tears, sodium chloride  flush  Antimicrobials: Anti-infectives (From admission, onward)    Start     Dose/Rate Route Frequency Ordered Stop   10/13/23 0937  vancomycin  (VANCOCIN ) powder  Status:  Discontinued          As needed 10/13/23 0937 10/13/23 1002   10/10/23 0900  doxycycline  200mg  in 20 mL SWI irrigation / sclerosing solution for IR       Note to Pharmacy: For irrigation only. WARNING - SCLEROSING AGENT  #5 vials sent to IR for procedure   500 mg Irrigation To Radiology 10/07/23 1418 10/10/23 1015   10/10/23 0000   doxycycline  (VIBRAMYCIN ) 500 mg in dextrose  5 % 250 mL IVPB  Status:  Discontinued        500 mg 125 mL/hr over 120 Minutes Intravenous To Radiology 10/07/23 1402 10/07/23 1409   10/10/23 0000  doxycycline  200mg  in 20 mL SWI irrigation / sclerosing solution for IR  Status:  Discontinued        200 mg Irrigation To Radiology 10/07/23 1418 10/07/23 1456   10/10/23 0000  doxycycline  200mg  in 20 mL SWI irrigation / sclerosing solution for IR  Status:  Discontinued        200 mg Irrigation To Radiology 10/07/23 1440 10/07/23 1456  10/02/23 2200  linezolid  (ZYVOX ) tablet 600 mg        600 mg Oral Every 12 hours 10/01/23 0852 10/09/23 2108   10/02/23 1000  linezolid  (ZYVOX ) tablet 600 mg  Status:  Discontinued        600 mg Oral Every 12 hours 10/01/23 0723 10/01/23 0852   09/30/23 1300  vancomycin  variable dose per unstable renal function (pharmacist dosing)  Status:  Discontinued         Does not apply See admin instructions 09/30/23 1301 10/01/23 0725   09/29/23 1145  amoxicillin -clavulanate (AUGMENTIN ) 875-125 MG per tablet 1 tablet        1 tablet Oral Every 12 hours 09/29/23 1047 10/09/23 2108   09/27/23 0200  vancomycin  (VANCOREADY) IVPB 1500 mg/300 mL        1,500 mg 150 mL/hr over 120 Minutes Intravenous Every 12 hours 09/26/23 1402 09/30/23 1448   09/26/23 1500  cefTRIAXone  (ROCEPHIN ) 2 g in sodium chloride  0.9 % 100 mL IVPB  Status:  Discontinued        2 g 200 mL/hr over 30 Minutes Intravenous Every 24 hours 09/26/23 1402 09/29/23 1047   09/26/23 1315  vancomycin  (VANCOREADY) IVPB 2000 mg/400 mL        2,000 mg 200 mL/hr over 120 Minutes Intravenous  Once 09/26/23 1220 09/26/23 1656   09/26/23 1230  metroNIDAZOLE  (FLAGYL ) IVPB 500 mg  Status:  Discontinued        500 mg 100 mL/hr over 60 Minutes Intravenous Every 12 hours 09/26/23 1137 09/29/23 1138   09/22/23 2200  voriconazole  (VFEND ) tablet 400 mg        400 mg Oral Every 12 hours 09/22/23 1521 10/21/23 2359   09/11/23 2200   acyclovir  (ZOVIRAX ) 200 MG capsule 400 mg       Note to Pharmacy: Give after dialysis the pm dose, on dialysis days   400 mg Oral 2 times daily 09/11/23 1551     09/11/23 2200  voriconazole  (VFEND ) tablet 350 mg  Status:  Discontinued        350 mg Oral Every 12 hours 09/11/23 1551 09/22/23 1521   09/07/23 1000  acyclovir  (ZOVIRAX ) 200 MG capsule 400 mg  Status:  Discontinued       Note to Pharmacy: Give after dialysis the pm dose, on dialysis days   400 mg Per Tube 2 times daily 09/07/23 0825 09/11/23 1551   09/03/23 1200  vancomycin  (VANCOCIN ) IVPB 1000 mg/200 mL premix  Status:  Discontinued        1,000 mg 200 mL/hr over 60 Minutes Intravenous Every M-W-F (Hemodialysis) 09/02/23 1021 09/03/23 0942   09/03/23 0942  vancomycin  variable dose per unstable renal function (pharmacist dosing)  Status:  Discontinued         Does not apply See admin instructions 09/03/23 0942 09/03/23 0945   09/02/23 0915  vancomycin  (VANCOCIN ) IVPB 1000 mg/200 mL premix        1,000 mg 200 mL/hr over 60 Minutes Intravenous  Once 09/02/23 0830 09/02/23 1215   09/01/23 0834  ceFAZolin  (ANCEF ) IVPB 2g/100 mL premix        over 30 Minutes Intravenous Continuous PRN 09/01/23 0834 09/01/23 0834   08/29/23 0914  vancomycin  variable dose per unstable renal function (pharmacist dosing)  Status:  Discontinued         Does not apply See admin instructions 08/29/23 0914 09/02/23 1021   08/27/23 1400  vancomycin  (VANCOCIN ) IVPB 1000 mg/200 mL premix  1,000 mg 200 mL/hr over 60 Minutes Intravenous  Once 08/27/23 0920 08/27/23 1653   08/25/23 1200  vancomycin  (VANCOCIN ) IVPB 1000 mg/200 mL premix        1,000 mg 200 mL/hr over 60 Minutes Intravenous Every M-W-F (Hemodialysis) 08/24/23 1405 08/29/23 1240   08/24/23 1445  vancomycin  (VANCOREADY) IVPB 2000 mg/400 mL        2,000 mg 200 mL/hr over 120 Minutes Intravenous  Once 08/24/23 1353 08/25/23 0323   08/14/23 0600  ceFAZolin  (ANCEF ) IVPB 2g/100 mL premix        2  g 200 mL/hr over 30 Minutes Intravenous To Radiology 08/13/23 1055 08/14/23 0606   08/09/23 1000  acyclovir  (ZOVIRAX ) 200 MG capsule 200 mg  Status:  Discontinued       Note to Pharmacy: Give after dialysis the pm dose, on dialysis days   200 mg Per Tube 2 times daily 08/08/23 2358 09/07/23 0825   08/09/23 0100  acyclovir  (ZOVIRAX ) 200 MG capsule 200 mg       Note to Pharmacy: Give after dialysis the pm dose, on dialysis days   200 mg Per Tube  Once 08/09/23 0009 08/09/23 0013   08/08/23 2345  acyclovir  (ZOVIRAX ) 200 MG capsule 200 mg  Status:  Discontinued       Note to Pharmacy: Give after dialysis the pm dose, on dialysis days   200 mg Oral 2 times daily 08/08/23 2257 08/08/23 2358   08/07/23 1530  ceFAZolin  (ANCEF ) IVPB 2g/100 mL premix        over 30 Minutes Intravenous Continuous PRN 08/07/23 1555 08/07/23 1530   08/05/23 2200  voriconazole  (VFEND ) tablet 350 mg  Status:  Discontinued        350 mg Per Tube Every 12 hours 08/05/23 1511 09/11/23 1551   08/01/23 1200  vancomycin  (VANCOCIN ) IVPB 1000 mg/200 mL premix        1,000 mg 200 mL/hr over 60 Minutes Intravenous Every M-W-F (Hemodialysis) 07/31/23 0649 08/01/23 1652   07/30/23 1200  vancomycin  (VANCOCIN ) IVPB 1000 mg/200 mL premix        1,000 mg 200 mL/hr over 60 Minutes Intravenous Every M-W-F (Hemodialysis) 07/30/23 0851 07/30/23 1241   07/29/23 0900  vancomycin  (VANCOCIN ) IVPB 1000 mg/200 mL premix        1,000 mg 200 mL/hr over 60 Minutes Intravenous  Once 07/29/23 0812 07/29/23 0943   07/28/23 1204  vancomycin  variable dose per unstable renal function (pharmacist dosing)  Status:  Discontinued         Does not apply See admin instructions 07/28/23 1204 07/31/23 0649   07/28/23 1000  voriconazole  (VFEND ) 350 mg in sodium chloride  0.9 % 100 mL IVPB  Status:  Discontinued       Placed in Followed by Linked Group   4 mg/kg  88.6 kg (Adjusted) 67.5 mL/hr over 120 Minutes Intravenous Every 12 hours 07/28/23 0748 08/05/23  1511   07/27/23 1000  voriconazole  (VFEND ) 400 mg in sodium chloride  0.9 % 100 mL IVPB  Status:  Discontinued       Placed in Followed by Linked Group   4 mg/kg  100.6 kg 70 mL/hr over 120 Minutes Intravenous Every 12 hours 07/26/23 0826 07/28/23 0748   07/27/23 0945  vancomycin  (VANCOREADY) IVPB 2000 mg/400 mL        2,000 mg 200 mL/hr over 120 Minutes Intravenous  Once 07/27/23 0849 07/27/23 1237   07/26/23 1515  Ampicillin -Sulbactam (UNASYN ) 3 g in sodium chloride  0.9 % 100  mL IVPB  Status:  Discontinued        3 g 200 mL/hr over 30 Minutes Intravenous Every 8 hours 07/26/23 1418 07/27/23 0830   07/26/23 0915  voriconazole  (VFEND ) 600 mg in sodium chloride  0.9 % 150 mL IVPB       Placed in Followed by Linked Group   6 mg/kg  100.6 kg 105 mL/hr over 120 Minutes Intravenous Every 12 hours 07/26/23 0826 07/27/23 0048   07/26/23 0900  Ampicillin -Sulbactam (UNASYN ) 3 g in sodium chloride  0.9 % 100 mL IVPB  Status:  Discontinued        3 g 200 mL/hr over 30 Minutes Intravenous Every 12 hours 07/26/23 0826 07/26/23 1418   07/23/23 1000  levofloxacin  (LEVAQUIN ) IVPB 500 mg  Status:  Discontinued       Placed in Followed by Linked Group   500 mg 100 mL/hr over 60 Minutes Intravenous Every 24 hours 07/22/23 1213 07/23/23 1001   07/23/23 1000  levofloxacin  (LEVAQUIN ) IVPB 500 mg  Status:  Discontinued       Placed in Followed by Linked Group   500 mg 100 mL/hr over 60 Minutes Intravenous Every 24 hours 07/23/23 1001 07/28/23 0804   07/22/23 1300  levofloxacin  (LEVAQUIN ) IVPB 750 mg       Placed in Followed by Linked Group   750 mg 100 mL/hr over 90 Minutes Intravenous  Once 07/22/23 1213 07/22/23 1542   07/21/23 1600  voriconazole  (VFEND ) 300 mg in sodium chloride  0.9 % 100 mL IVPB  Status:  Discontinued        300 mg 65 mL/hr over 120 Minutes Intravenous Every 12 hours 07/21/23 1430 07/22/23 1415   07/21/23 1000  voriconazole  (VFEND ) tablet 200 mg  Status:  Discontinued         200 mg Oral Every 12 hours 07/20/23 0944 07/21/23 1015   07/20/23 1030  voriconazole  (VFEND ) 530 mg in sodium chloride  0.9 % 150 mL IVPB        6 mg/kg  88.6 kg (Adjusted) 101.5 mL/hr over 120 Minutes Intravenous Every 12 hours 07/20/23 0944 07/21/23 0030   07/19/23 1400  meropenem  (MERREM ) 1 g in sodium chloride  0.9 % 100 mL IVPB  Status:  Discontinued        1 g 200 mL/hr over 30 Minutes Intravenous Every 8 hours 07/19/23 1143 07/22/23 1213   07/18/23 0600  meropenem  (MERREM ) 1 g in sodium chloride  0.9 % 100 mL IVPB  Status:  Discontinued        1 g 200 mL/hr over 30 Minutes Intravenous Every 24 hours 07/17/23 0916 07/19/23 1143   07/17/23 0700  meropenem  (MERREM ) 1 g in sodium chloride  0.9 % 100 mL IVPB  Status:  Discontinued        1 g 200 mL/hr over 30 Minutes Intravenous Every 12 hours 07/17/23 0601 07/17/23 0916   07/15/23 1300  vancomycin  (VANCOCIN ) IVPB 1000 mg/200 mL premix  Status:  Discontinued        1,000 mg 200 mL/hr over 60 Minutes Intravenous Every 24 hours 07/14/23 1129 07/22/23 1319   07/14/23 1215  vancomycin  (VANCOREADY) IVPB 1750 mg/350 mL        1,750 mg 175 mL/hr over 120 Minutes Intravenous  Once 07/14/23 1129 07/14/23 1449   07/09/23 1800  piperacillin -tazobactam (ZOSYN ) IVPB 3.375 g  Status:  Discontinued        3.375 g 12.5 mL/hr over 240 Minutes Intravenous Every 12 hours 07/09/23 0756 07/09/23 0844   07/09/23  1400  piperacillin -tazobactam (ZOSYN ) IVPB 3.375 g  Status:  Discontinued        3.375 g 12.5 mL/hr over 240 Minutes Intravenous Every 8 hours 07/09/23 0844 07/17/23 0557   07/08/23 1300  piperacillin -tazobactam (ZOSYN ) IVPB 3.375 g  Status:  Discontinued        3.375 g 12.5 mL/hr over 240 Minutes Intravenous Every 8 hours 07/08/23 1129 07/09/23 0756   07/05/23 2200  azithromycin  (ZITHROMAX ) 500 mg in sodium chloride  0.9 % 250 mL IVPB        500 mg 250 mL/hr over 60 Minutes Intravenous Daily at bedtime 07/05/23 2022 07/06/23 2302   07/05/23 2115   azithromycin  (ZITHROMAX ) 250 mg in dextrose  5 % 125 mL IVPB  Status:  Discontinued       Note to Pharmacy: Missed dose this morning   250 mg 127.5 mL/hr over 60 Minutes Intravenous Every 24 hours 07/05/23 2016 07/05/23 2021   07/04/23 0900  fluconazole  (DIFLUCAN ) IVPB 200 mg  Status:  Discontinued        200 mg 100 mL/hr over 60 Minutes Intravenous Every 48 hours 07/04/23 0722 07/09/23 0809   07/03/23 0600  cefTRIAXone  (ROCEPHIN ) 1 g in sodium chloride  0.9 % 100 mL IVPB  Status:  Discontinued        1 g 200 mL/hr over 30 Minutes Intravenous Every 24 hours 07/03/23 0508 07/08/23 1104   07/03/23 0600  azithromycin  (ZITHROMAX ) tablet 250 mg  Status:  Discontinued        250 mg Oral Daily 07/03/23 0508 07/05/23 2016        I have personally reviewed the following labs and images: CBC: Recent Labs  Lab 10/09/23 0443 10/10/23 0420 10/11/23 0328 10/12/23 0533 10/13/23 0610  WBC 5.3 5.5 5.6 6.1 5.7  HGB 9.5* 10.5* 9.3* 9.9* 10.3*  HCT 29.3* 32.5* 29.1* 30.8* 32.3*  MCV 106.9* 107.3* 105.8* 106.2* 106.3*  PLT 430* 417* 387 378 371   BMP &GFR Recent Labs  Lab 10/07/23 0638 10/12/23 0533  NA 132* 131*  K 4.6 5.1  CL 101 101  CO2 22 20*  GLUCOSE 87 126*  BUN 23* 24*  CREATININE 1.24 1.40*  CALCIUM  8.6* 8.3*  MG 1.8 1.8  PHOS  --  3.6   Estimated Creatinine Clearance: 71.8 mL/min (A) (by C-G formula based on SCr of 1.4 mg/dL (H)). Liver & Pancreas: Recent Labs  Lab 10/07/23 0638 10/12/23 0533  AST 9*  --   ALT 5  --   ALKPHOS 83  --   BILITOT 0.5  --   PROT 5.5*  --   ALBUMIN  2.1* 2.3*   No results for input(s): LIPASE, AMYLASE in the last 168 hours. No results for input(s): AMMONIA in the last 168 hours. Diabetic: No results for input(s): HGBA1C in the last 72 hours. Recent Labs  Lab 10/12/23 1229 10/12/23 1704 10/12/23 2101 10/13/23 0710 10/13/23 1007  GLUCAP 155* 125* 164* 102* 123*   Cardiac Enzymes: No results for input(s): CKTOTAL, CKMB,  CKMBINDEX, TROPONINI in the last 168 hours. No results for input(s): PROBNP in the last 8760 hours. Coagulation Profile: No results for input(s): INR, PROTIME in the last 168 hours. Thyroid Function Tests: No results for input(s): TSH, T4TOTAL, FREET4, T3FREE, THYROIDAB in the last 72 hours. Lipid Profile: No results for input(s): CHOL, HDL, LDLCALC, TRIG, CHOLHDL, LDLDIRECT in the last 72 hours. Anemia Panel: No results for input(s): VITAMINB12, FOLATE, FERRITIN, TIBC, IRON, RETICCTPCT in the last 72 hours. Urine analysis:  Component Value Date/Time   COLORURINE AMBER (A) 08/23/2023 0618   APPEARANCEUR CLOUDY (A) 08/23/2023 0618   LABSPEC 1.017 08/23/2023 0618   PHURINE 5.0 08/23/2023 0618   GLUCOSEU NEGATIVE 08/23/2023 0618   HGBUR NEGATIVE 08/23/2023 0618   BILIRUBINUR NEGATIVE 08/23/2023 0618   KETONESUR NEGATIVE 08/23/2023 0618   PROTEINUR 30 (A) 08/23/2023 0618   NITRITE NEGATIVE 08/23/2023 0618   LEUKOCYTESUR LARGE (A) 08/23/2023 0618   Sepsis Labs: Invalid input(s): PROCALCITONIN, LACTICIDVEN  Microbiology: No results found for this or any previous visit (from the past 240 hours).  Radiology Studies: DG Humerus Left Result Date: 10/13/2023 CLINICAL DATA:  Left upper extremity amputation through the humerus. Multiple myeloma. EXAM: LEFT HUMERUS - 2+ VIEW COMPARISON:  09/15/2023 FINDINGS: A single C-arm image demonstrates the recently demonstrated lytic changes in the distal humerus and associated fixation hardware. IMPRESSION: Single C-arm image of the distal humerus and associated fixation hardware. Electronically Signed   By: Elspeth Bathe M.D.   On: 10/13/2023 11:05   DG C-Arm 1-60 Min-No Report Result Date: 10/13/2023 Fluoroscopy was utilized by the requesting physician.  No radiographic interpretation.   DG C-Arm 1-60 Min-No Report Result Date: 10/13/2023 Fluoroscopy was utilized by the requesting physician.  No  radiographic interpretation.      Landy Dunnavant T. Nik Gorrell [facility]  If 7PM-7AM, please contact night-coverage [email or web address] 10/13/2023, 11:29 AM

## 2023-10-14 DIAGNOSIS — A419 Sepsis, unspecified organism: Secondary | ICD-10-CM | POA: Diagnosis not present

## 2023-10-14 DIAGNOSIS — B449 Aspergillosis, unspecified: Secondary | ICD-10-CM | POA: Diagnosis not present

## 2023-10-14 DIAGNOSIS — J181 Lobar pneumonia, unspecified organism: Secondary | ICD-10-CM | POA: Diagnosis not present

## 2023-10-14 DIAGNOSIS — C9000 Multiple myeloma not having achieved remission: Secondary | ICD-10-CM | POA: Diagnosis not present

## 2023-10-14 LAB — RENAL FUNCTION PANEL
Albumin: 2.5 g/dL — ABNORMAL LOW (ref 3.5–5.0)
Anion gap: 9 (ref 5–15)
BUN: 34 mg/dL — ABNORMAL HIGH (ref 6–20)
CO2: 21 mmol/L — ABNORMAL LOW (ref 22–32)
Calcium: 8.6 mg/dL — ABNORMAL LOW (ref 8.9–10.3)
Chloride: 101 mmol/L (ref 98–111)
Creatinine, Ser: 1.63 mg/dL — ABNORMAL HIGH (ref 0.61–1.24)
GFR, Estimated: 48 mL/min — ABNORMAL LOW (ref >60)
Glucose, Bld: 146 mg/dL — ABNORMAL HIGH (ref 70–99)
Phosphorus: 4.6 mg/dL (ref 2.5–4.6)
Potassium: 5.8 mmol/L — ABNORMAL HIGH (ref 3.5–5.1)
Sodium: 131 mmol/L — ABNORMAL LOW (ref 135–145)

## 2023-10-14 LAB — CBC
HCT: 25.3 % — ABNORMAL LOW (ref 39.0–52.0)
Hemoglobin: 8.1 g/dL — ABNORMAL LOW (ref 13.0–17.0)
MCH: 34.2 pg — ABNORMAL HIGH (ref 26.0–34.0)
MCHC: 32 g/dL (ref 30.0–36.0)
MCV: 106.8 fL — ABNORMAL HIGH (ref 80.0–100.0)
Platelets: 319 K/uL (ref 150–400)
RBC: 2.37 MIL/uL — ABNORMAL LOW (ref 4.22–5.81)
RDW: 18.4 % — ABNORMAL HIGH (ref 11.5–15.5)
WBC: 6.7 K/uL (ref 4.0–10.5)
nRBC: 0 % (ref 0.0–0.2)

## 2023-10-14 LAB — GLUCOSE, CAPILLARY
Glucose-Capillary: 172 mg/dL — ABNORMAL HIGH (ref 70–99)
Glucose-Capillary: 99 mg/dL (ref 70–99)
Glucose-Capillary: 139 mg/dL — ABNORMAL HIGH (ref 70–99)
Glucose-Capillary: 124 mg/dL — ABNORMAL HIGH (ref 70–99)

## 2023-10-14 LAB — MAGNESIUM: Magnesium: 2 mg/dL (ref 1.7–2.4)

## 2023-10-14 LAB — PREPARE RBC (CROSSMATCH)

## 2023-10-14 MED ORDER — FUROSEMIDE 10 MG/ML IJ SOLN
20.0000 mg | Freq: Once | INTRAMUSCULAR | Status: AC
  Administered 2023-10-14: 20 mg via INTRAVENOUS
  Filled 2023-10-14: qty 4

## 2023-10-14 MED ORDER — SODIUM CHLORIDE 0.9% IV SOLUTION: Freq: Once | INTRAVENOUS | Status: AC

## 2023-10-14 MED ORDER — SODIUM ZIRCONIUM CYCLOSILICATE 10 G PO PACK
10.0000 g | PACK | Freq: Once | ORAL | Status: AC
  Administered 2023-10-14: 10 g via ORAL
  Filled 2023-10-14: qty 1

## 2023-10-14 MED ORDER — FUROSEMIDE 10 MG/ML IJ SOLN
20.0000 mg | Freq: Once | INTRAMUSCULAR | Status: AC
  Administered 2023-10-15: 20 mg via INTRAVENOUS
  Filled 2023-10-14: qty 4

## 2023-10-14 NOTE — Consult Note (Signed)
 Chief Complaint: Multiple myeloma - IR consulted for port-a-catheter placement for chemotherapy  Referring Provider(s): Ennever, Maude SAUNDERS, MD   Supervising Physician: Vanice Revel  Patient Status: [facility] - In-pt  History of Present Illness: Frank Aikey. is a 58 y.o. male with hx of OSA, uses CPAP, multiple myeloma, A-fib on Eliquis , hyperlipidemia, tobacco use disorder. Has had complicated hospital stay with sepsis, pneumonia, encephalopathy. Was in the ICU 36 days. Now awake, alert and much improved. 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 Oncology now referring patient to IR service for port-a-catheter placement to begin chemotherapy.  Today patient having some mild discomfort of the left shoulder secondary to procedure yesterday, otherwise no complaint.  Pt receiving blood transfusion today per oncology note.   Patient is Full Code  Past Medical History:  Diagnosis Date   Anxiety    Arthritis    Asthma    Bipolar disorder (HCC)    Current every day smoker    Depression    Diabetes mellitus, type II (HCC)    Dyspnea    History of kidney stones    History of pneumonia    Hyperlipidemia    Hypertension    Morbid obesity (HCC)    Sedentary lifestyle    Seizures (HCC)    last seizure 12 yrs ago, no current problem   Sleep apnea    uses CPAP nightly    Past Surgical History:  Procedure Laterality Date   CARDIAC CATHETERIZATION  2020   carpal tunel Left    COLONOSCOPY     HARDWARE REMOVAL Left 07/26/2022   Procedure: HARDWARE REMOVAL ELBOW;  Surgeon: [name], MD;  Location: [facility] OR;  Service: Orthopedics;  Laterality: Left;   IR FLUORO GUIDE CV LINE RIGHT  08/07/2023   IR FLUORO GUIDE CV LINE RIGHT  09/01/2023   IR GASTROSTOMY TUBE MOD SED  08/14/2023   IR GASTROSTOMY TUBE REMOVAL  10/01/2023   IR REMOVAL TUN CV CATH  W/O FL  08/29/2023   IR REMOVAL TUN CV CATH W/O FL  09/08/2023   IR SCLEROTHERAPY OF A FLUID COLLECTION  10/10/2023   IR US  GUIDE VASC ACCESS RIGHT  08/07/2023   IR US  GUIDE VASC ACCESS RIGHT  09/01/2023   ORIF HUMERUS FRACTURE Left 01/16/2022   Procedure: OPEN REDUCTION INTERNAL FIXATION (ORIF) DISTAL HUMERUS FRACTURE;  Surgeon: [name], MD;  Location: [facility] OR;  Service: Orthopedics;  Laterality: Left;   ORIF HUMERUS FRACTURE Left 01/29/2023   Procedure: OPEN REDUCTION INTERNAL FIXATION (ORIF) DISTAL HUMERUS FRACTURE;  Surgeon: [name], MD;  Location: [facility] OR;  Service: Orthopedics;  Laterality: Left;   OTHER SURGICAL HISTORY     R & L shoulder   OTHER SURGICAL HISTORY Right    Knee surgery x several   TOTAL HIP ARTHROPLASTY Left 12/26/2017   Procedure: LEFT TOTAL HIP ARTHROPLASTY ANTERIOR APPROACH;  Surgeon: Vernetta Lonni GRADE, MD;  Location: [facility] ORS;  Service: Orthopedics;  Laterality: Left;   TOTAL KNEE ARTHROPLASTY Left 11/23/2021   Procedure: LEFT TOTAL KNEE ARTHROPLASTY;  Surgeon: Vernetta Lonni GRADE, MD;  Location: [facility] ORS;  Service: Orthopedics;  Laterality: Left;    Allergies: Morphine  and codeine, Shellfish allergy, Betadine  [povidone iodine ], Bupropion, Chlorhexidine , Influenza vaccines, and Metoprolol   Medications: Prior to Admission medications   Medication Sig Start Date End Date Taking? Authorizing Provider  albuterol  (PROVENTIL  HFA;VENTOLIN  HFA)  108 (90 Base) MCG/ACT inhaler Inhale 2 puffs into the lungs every 6 (six) hours as needed for wheezing or shortness of breath.   Yes [provider]  albuterol  (PROVENTIL ) (2.5 MG/3ML) 0.083% nebulizer solution 2.5 mg every 2 (two) hours as needed for wheezing or shortness of breath.   Yes [provider]  aspirin  EC 81 MG tablet Take 81 mg by mouth daily. Swallow whole.   Yes [provider]  busPIRone  (BUSPAR ) 15 MG tablet Take 15 mg by mouth at bedtime.   Yes [provider]   celecoxib  (CELEBREX ) 200 MG capsule Take 200 mg by mouth daily.   Yes [provider]  clonazePAM  (KLONOPIN ) 1 MG tablet Take 1 mg by mouth once as needed for anxiety.   Yes [provider]  cyclobenzaprine  (FLEXERIL ) 5 MG tablet Take 1 tablet (5 mg total) by mouth 3 (three) times daily as needed for muscle spasms. Patient taking differently: Take 5 mg by mouth daily as needed for muscle spasms. 01/30/23  Yes Danton Lauraine LABOR, PA-C  divalproex  (DEPAKOTE ) 500 MG DR tablet Take 500 mg by mouth 2 (two) times daily.   Yes [provider]  EPINEPHrine  0.3 mg/0.3 mL IJ SOAJ injection Inject 0.3 mg into the muscle as needed for anaphylaxis. 08/28/21  Yes [provider]  esomeprazole (NEXIUM) 40 MG capsule Take 40 mg by mouth in the morning.   Yes [provider]  Fluticasone -Salmeterol (ADVAIR) 500-50 MCG/DOSE AEPB Inhale 2 puffs into the lungs in the morning.   Yes [provider]  furosemide  (LASIX ) 40 MG tablet Take 40 mg by mouth daily as needed for fluid or edema.   Yes [provider]  gabapentin  (NEURONTIN ) 300 MG capsule Take 300-600 mg by mouth See admin instructions. 300 mg in the morning, 600 mg at bedtime   Yes [provider]  hydrALAZINE  (APRESOLINE ) 10 MG tablet Take 10 mg by mouth in the morning and at bedtime.   Yes [provider]  hydrochlorothiazide  (HYDRODIURIL ) 25 MG tablet Take 25 mg by mouth daily.   Yes [provider]  lisinopril  (ZESTRIL ) 40 MG tablet Take 40 mg by mouth daily. 03/25/23  Yes [provider]  metFORMIN  (GLUCOPHAGE ) 1000 MG tablet Take 1,000 mg by mouth 2 (two) times daily with a meal.   Yes [provider]  montelukast  (SINGULAIR ) 10 MG tablet Take 10 mg by mouth at bedtime.   Yes [provider]  OVER THE COUNTER MEDICATION Take 400 mg by mouth at bedtime. Burdock root   Yes [provider]  oxyCODONE  (ROXICODONE ) 5 MG immediate release  tablet Take 1 tablet (5 mg total) by mouth every 4 (four) hours as needed for severe pain (pain score 7-10). 01/30/23  Yes Danton Lauraine LABOR, PA-C  PARoxetine  (PAXIL ) 40 MG tablet Take 40 mg by mouth every morning.   Yes [provider]  spironolactone  (ALDACTONE ) 25 MG tablet Take 25 mg by mouth at bedtime. 10/07/21  Yes [provider]  tamsulosin  (FLOMAX ) 0.4 MG CAPS capsule Take 0.4 mg by mouth at bedtime. 10/07/21  Yes [provider]  testosterone  cypionate (DEPOTESTOSTERONE CYPIONATE) 200 MG/ML injection Inject 0.5 mLs (100 mg total) into the muscle every 14 (fourteen) days. 09/17/16  Yes Nida, Gebreselassie W, MD  verapamil  (CALAN -SR) 120 MG CR tablet Take 120 mg by mouth daily.   Yes [provider]  Vitamin D , Ergocalciferol , (DRISDOL ) 1.25 MG (50000 UNIT) CAPS capsule Take 1 capsule (50,000 Units  total) by mouth every Thursday. Patient taking differently: Take 50,000 Units by mouth once a week. Tuesday 02/06/23  Yes Danton Lauraine LABOR, PA-C  buPROPion (WELLBUTRIN XL) 150 MG 24 hr tablet Take 150 mg by mouth daily. Patient not taking: Reported on 07/04/2023    [provider]     Family History  Problem Relation Age of Onset   Diabetes Mother    Hypertension Mother    Heart attack Mother    Osteoporosis Mother    Diabetes Father    Hypertension Father    Heart attack Father    Diabetes Sister    Hypertension Sister    Diabetes Brother    Hypertension Brother     Social History   Socioeconomic History   Marital status: Married    Spouse name: Not on file   Number of children: Not on file   Years of education: Not on file   Highest education level: Not on file  Occupational History   Not on file  Tobacco Use   Smoking status: Every Day    Current packs/day: 0.50    Average packs/day: 0.5 packs/day for 25.0 years (12.5 ttl pk-yrs)    Types: Cigarettes   Smokeless tobacco: Never  Vaping Use   Vaping status: Never Used  Substance  and Sexual Activity   Alcohol  use: No   Drug use: Yes    Types: Marijuana    Comment: 01/28/2023   Sexual activity: Yes  Other Topics Concern   Not on file  Social History Narrative   Not on file   Social Drivers of Health   Financial Resource Strain: Not on file  Food Insecurity: No Food Insecurity (07/05/2023)   Hunger Vital Sign    Worried About Running Out of Food in the Last Year: Never true    Ran Out of Food in the Last Year: Never true  Transportation Needs: No Transportation Needs (07/05/2023)   PRAPARE - Administrator, Civil Service (Medical): No    Lack of Transportation (Non-Medical): No  Physical Activity: Not on file  Stress: Not on file  Social Connections: Not on file     Review of Systems: A 12 point ROS discussed and pertinent positives are indicated in the HPI above.  All other systems are negative.    Vital Signs: BP (!) 138/92 (BP Location: Left Arm)   Pulse (!) 106   Temp 98.6 F (37 C)   Resp 18   Ht 6' (1.829 m)   Wt 235 lb 14.3 oz (107 kg)   SpO2 96%   BMI 31.99 kg/m   Advance Care Plan: The advanced care place/surrogate decision maker was discussed at the time of visit and the patient did not wish to discuss or was not able to name a surrogate decision maker or provide an advance care plan.  Physical Exam Vitals and nursing note reviewed.  Constitutional:      General: He is not in acute distress. HENT:     Mouth/Throat:     Mouth: Mucous membranes are moist.     Pharynx: Oropharynx is clear.  Cardiovascular:     Rate and Rhythm: Tachycardia present.  Pulmonary:     Effort: Pulmonary effort is normal.     Breath sounds: Normal breath sounds.  Abdominal:     Palpations: Abdomen is soft.     Tenderness: There is no abdominal tenderness.     Comments: + r hip drain noted. Minimal dark red output  in bulb  Musculoskeletal:     Comments: LUE amputation noted with dressing clean dry and intact.  Skin:    General: Skin is  warm and dry.  Neurological:     Mental Status: He is alert and oriented to person, place, and time. Mental status is at baseline.     Imaging: DG Humerus Left Result Date: 10/13/2023 CLINICAL DATA:  Left upper extremity amputation through the humerus. Multiple myeloma. EXAM: LEFT HUMERUS - 2+ VIEW COMPARISON:  09/15/2023 FINDINGS: A single C-arm image demonstrates the recently demonstrated lytic changes in the distal humerus and associated fixation hardware. IMPRESSION: Single C-arm image of the distal humerus and associated fixation hardware. Electronically Signed   By: Elspeth Bathe M.D.   On: 10/13/2023 11:05   DG C-Arm 1-60 Min-No Report Result Date: 10/13/2023 Fluoroscopy was utilized by the requesting physician.  No radiographic interpretation.   DG C-Arm 1-60 Min-No Report Result Date: 10/13/2023 Fluoroscopy was utilized by the requesting physician.  No radiographic interpretation.   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 Planned sclerosing. EXAM: Sclerosing of abnormal fluid collection COMPARISON:  None Available. MEDICATIONS: 500 mg doxycycline ; intra procedural ANESTHESIA/SEDATION: Moderate (conscious) sedation was employed during this procedure. A total of Versed  2 mg and Fentanyl  100 mcg was administered intravenously by the radiology nurse. Total intra-service moderate Sedation Time: 11 minutes. The patient's level of consciousness and vital signs were monitored continuously by radiology nursing throughout the procedure under my direct supervision. CONTRAST:  10 mL Omnipaque  300-administered into the collecting system(s) FLUOROSCOPY: Radiation Exposure Index (as provided by the fluoroscopic device): 5 mGy Kerma COMPLICATIONS: None immediate. PROCEDURE: Informed written consent was obtained from the patient after a thorough discussion of the procedural risks, benefits and  alternatives. All questions were addressed. Maximal Sterile Barrier Technique was utilized including caps, mask, sterile gowns, sterile gloves, sterile drape, hand hygiene and skin antiseptic. A timeout was performed prior to the initiation of the procedure. The indwelling catheter in the right hip region prepped and draped in usual sterile fashion. The indwelling 10 French catheter was aggressively irrigated with normal saline and flushed. Dilute contrast was then injected under fluoroscopic guidance into the catheter to evaluate if the fluid collection was communicating with vascular or lymphatic structure. Contrast flows freely into the catheter and outlines the fluid collection which is irregular in appearance, however there is no vascular or lymphatic communication identified. The contrast was then mostly withdrawn. The catheter and retention sutures were cut. A new 10 French catheter was then advanced over a guidewire into the fluid collection and repositioned in a more cephalad location. Approximately 500 mg of doxycycline  was reconstituted with 50 mL of normal saline and then approximately 35 mL of this mixture was injected into the 10 French pigtail catheter. The catheter was then capped. Retention suture and sterile dressing were applied. The doxycycline  will dwell within the cavity for approximately 1 hour and then the catheter will be connected to a JP bulb and the patient will be monitored for output. IMPRESSION: Satisfactory exchange of a 10 French right hip drainage catheter with sclerosing using doxycycline  as described above. Catheter will remain to a JP bulb until output has sufficiently diminished. Electronically Signed   By: Cordella Banner   On: 10/10/2023 17:18   CT PELVIS WO CONTRAST Result Date: 10/07/2023 CLINICAL DATA:  Right hip drain evaluation. EXAM: CT PELVIS WITHOUT CONTRAST TECHNIQUE: Multidetector CT imaging of the  pelvis was performed following the standard protocol without  intravenous contrast. RADIATION DOSE REDUCTION: This exam was performed according to the departmental dose-optimization program which includes automated exposure control, adjustment of the mA and/or kV according to patient size and/or use of iterative reconstruction technique. COMPARISON:  CT abdomen pelvis dated 07/08/2023. FINDINGS: Urinary Tract: The visualized ureters appear unremarkable. Punctate stone noted in the posterior bladder lumen. The urinary bladder is otherwise unremarkable. Bowel:  Moderate stool in the colon.  The appendix is normal. Vascular/Lymphatic: Moderate aortoiliac atherosclerotic disease. No pelvic adenopathy. Reproductive: The prostate and seminal vesicles are grossly unremarkable. Other: Right gluteal pigtail drainage catheter. Significant interval decrease in the size of the hematoma in the right gluteal muscle now measuring approximately 4.7 x 9.3 cm. Additional loculated collection in the anterior upper right thigh significantly decreased in size since the prior CT and measures approximately 10 x 17 cm in greatest axial dimensions. Musculoskeletal: Osteopenia with degenerative changes of the spine. Total left hip arthroplasty. Scattered lucent lesions throughout the bones as seen on the prior CT. IMPRESSION: 1. Right gluteal pigtail drainage catheter with significant interval decrease in the size of the hematoma in the right gluteal muscle. 2. Significant interval decrease in the size of the loculated collection in the anterior upper right thigh. 3. Punctate stone in the posterior bladder lumen. 4.  Aortic Atherosclerosis (ICD10-I70.0). Electronically Signed   By: Vanetta Chou M.D.   On: 10/07/2023 16:53   DG CHEST PORT 1 VIEW Result Date: 10/06/2023 EXAM: 1 VIEW XRAY OF THE CHEST 10/06/2023 08:52:00 PM COMPARISON: 09/28/2023 CLINICAL HISTORY: 141880 SOB (shortness of breath) 141880. sob FINDINGS: LUNGS AND PLEURA: Increased interstitial markings, left greater than right,  favoring atypical infection / pneumonia over interstitial edema. No pleural effusion. No pneumothorax. HEART AND MEDIASTINUM: No acute abnormality of the cardiac and mediastinal silhouettes. BONES AND SOFT TISSUES: Degenerative changes of the bilateral shoulders. No acute osseous abnormality. IMPRESSION: 1. Increased interstitial markings, left greater than right, favoring atypical infection/pneumonia over interstitial edema. Electronically signed by: Pinkie Pebbles MD 10/06/2023 08:58 PM EDT RP Workstation: [id number]   CT FEMUR RIGHT WO CONTRAST Result Date: 10/03/2023 CLINICAL DATA:  Right thigh fluid collection, drain maintenance EXAM: CT OF THE LOWER RIGHT EXTREMITY WITHOUT CONTRAST TECHNIQUE: Multidetector CT imaging of the right lower extremity was performed according to the standard protocol. RADIATION DOSE REDUCTION: This exam was performed according to the departmental dose-optimization program which includes automated exposure control, adjustment of the mA and/or kV according to patient size and/or use of iterative reconstruction technique. COMPARISON:  09/24/2023 FINDINGS: Bones/Joint/Cartilage Stable scattered bone lucencies, see description from the 09/24/2023 exam. Stable right hip arthropathy. Prominent osteoarthritis of the right knee. Left total knee prosthesis partially observed. Large spur like projections from the inferior patella extending into the joint. Moderate knee effusion possibly with synovitis. Suspected free osteochondral fragments in the knee joint. Ligaments N/A Muscles and Tendons Semimembranosus muscular atrophy bilaterally. Soft tissues Reduced size of the fluid collection along the superficial fascia margin of the tensor fascia lata and rectus femoris, currently measuring about 20.9 by 3.0 by 11.8 cm (volume = 390 cm^3). Previous total volume cannot be calculated as this was not completely included, but previous total volume was probably about 3 times is much. Surrounding  subcutaneous edema along the right lateral thigh tracking down to the knee. There is also some subcutaneous edema medially in both thighs. Edema tracks along the superficial fascia margin of the posterior calf musculature in the upper calf region.  Prominence of stool in visualized colon, cannot exclude constipation. Atherosclerosis noted. IMPRESSION: 1. Reduced size of the fluid collection along the superficial fascia margin of the tensor fascia lata and rectus femoris, currently measuring about 20.9 by 3.0 by 11.8 cm (volume = 390 cc). Previous total volume cannot be calculated as this was not completely included, but previous total volume was probably about 3 times is much. 2. Surrounding subcutaneous edema along the right lateral thigh tracking down to the knee. There is also some subcutaneous edema medially in both thighs. Edema tracks along the superficial fascia margin of the posterior calf musculature in the upper calf region. 3. Prominent osteoarthritis of the right knee. Moderate knee effusion possibly with synovitis. Suspected free osteochondral fragments in the knee joint. 4. Prominence of stool in visualized colon, cannot exclude constipation. 5. Atherosclerosis. 6. Stable scattered bone lucencies, see description from the 09/24/2023 exam. Electronically Signed   By: Ryan Salvage M.D.   On: 10/03/2023 08:21   VAS US  LOWER EXTREMITY VENOUS (DVT) Result Date: 10/01/2023  Lower Venous DVT Study Patient Name:  Frank [name].  Date of Exam:   10/01/2023 Medical Rec #: [id number]           Accession #:    [id number] Date of Birth: [birth year]           Patient Gender: M Patient Age:   60 years Exam Location:  [facility] Procedure:      VAS US  LOWER EXTREMITY VENOUS (DVT) Referring Phys: DELILIAH RASHID --------------------------------------------------------------------------------  Indications: Right thigh swelling, erythema, pain.  Risk Factors: Extended hospitalization with immobility (3  months). Limitations: Poor ultrasound/tissue interface. Comparison Study: LLEV on 07/18/2023 was negative for DVT. Performing Technologist: Ezzie Potters RVT, RDMS  Examination Guidelines: A complete evaluation includes B-mode imaging, spectral Doppler, color Doppler, and power Doppler as needed of all accessible portions of each vessel. Bilateral testing is considered an integral part of a complete examination. Limited examinations for reoccurring indications may be performed as noted. The reflux portion of the exam is performed with the patient in reverse Trendelenburg.  +---------+---------------+---------+-----------+----------+-------------------+ RIGHT    CompressibilityPhasicitySpontaneityPropertiesThrombus Aging      +---------+---------------+---------+-----------+----------+-------------------+ CFV      Full           Yes      Yes                                      +---------+---------------+---------+-----------+----------+-------------------+ SFJ      Full                                                             +---------+---------------+---------+-----------+----------+-------------------+ FV Prox  Full           Yes      Yes                                      +---------+---------------+---------+-----------+----------+-------------------+ FV Mid   Full           Yes      Yes                                      +---------+---------------+---------+-----------+----------+-------------------+  FV DistalFull           Yes      Yes                                      +---------+---------------+---------+-----------+----------+-------------------+ PFV      Full                                                             +---------+---------------+---------+-----------+----------+-------------------+ POP      Full           Yes      Yes                                       +---------+---------------+---------+-----------+----------+-------------------+ PTV      None           No       No                   Age indeterminate                                                         (one of paired)     +---------+---------------+---------+-----------+----------+-------------------+ PERO     Full                                                             +---------+---------------+---------+-----------+----------+-------------------+   +---------+---------------+---------+-----------+----------+--------------+ LEFT     CompressibilityPhasicitySpontaneityPropertiesThrombus Aging +---------+---------------+---------+-----------+----------+--------------+ CFV      None           No       No                   Acute          +---------+---------------+---------+-----------+----------+--------------+ SFJ      Partial                                      Acute          +---------+---------------+---------+-----------+----------+--------------+ FV Prox  Full           Yes      Yes                                 +---------+---------------+---------+-----------+----------+--------------+ FV Mid   Full           Yes      Yes                                 +---------+---------------+---------+-----------+----------+--------------+ FV DistalFull  Yes      Yes                                 +---------+---------------+---------+-----------+----------+--------------+ PFV      None           No       No                                  +---------+---------------+---------+-----------+----------+--------------+ POP      Full           Yes      Yes                                 +---------+---------------+---------+-----------+----------+--------------+ PTV      Full                                                        +---------+---------------+---------+-----------+----------+--------------+ PERO     Full                                                         +---------+---------------+---------+-----------+----------+--------------+ Proximal CFV compresses, has color, and phasic flow. Mid and distal CFV have occlusive thrombus    Summary: BILATERAL: -No evidence of popliteal cyst, bilaterally. RIGHT: - Findings consistent with age indeterminate deep vein thrombosis involving the right posterior tibial veins.   LEFT: - Findings consistent with acute deep vein thrombosis involving the left common femoral vein, SF junction, and left femoral vein.  - The common femoral vein obstruction does not appear to extend above inguinal ligament.  *See table(s) above for measurements and observations. Electronically signed by Gaile New MD on 10/01/2023 at 5:11:34 PM.    Final    IR GASTROSTOMY TUBE REMOVAL Result Date: 10/01/2023 INDICATION: Gastrostomy tube is no longer needed. EXAM: GASTROSTOMY TUBE REMOVAL MEDICATIONS: None ANESTHESIA/SEDATION: None CONTRAST:  None FLUOROSCOPY: None COMPLICATIONS: None immediate. PROCEDURE: Informed written consent was obtained from the patient after a thorough discussion of the procedural risks, benefits and alternatives. All questions were addressed. A timeout was performed prior to the initiation of the procedure. Balloon retention 18 French gastrostomy tube was intact. 5 cc syringe was used to remove 5 cc of saline from balloon. Gastrostomy tube was removed without difficulty. Site was cleaned and dressed appropriately. IMPRESSION: Successful removal of 18 French balloon retention gastrostomy tube. Performed by Carlin Griffon, PA-C Electronically Signed   By: Juliene Balder M.D.   On: 10/01/2023 16:40   US  RT LOWER EXTREM LTD SOFT TISSUE NON VASCULAR Result Date: 09/30/2023 CLINICAL DATA:  Evaluate for abscess. EXAM: ULTRASOUND RIGHT LOWER EXTREMITY LIMITED TECHNIQUE: Ultrasound examination of the lower extremity soft tissues was performed in the area of clinical concern. COMPARISON:   None Available. FINDINGS: In the region of palpable abnormality in the lateral aspect of the right thigh there is a well-circumscribed is 8.5 x 2.3 x 6.8 cm fluid collection in the subcutaneous tissues. This is 1.8 cm deep  to the skin. There is no abnormal vascularity. There is no soft tissue mass. IMPRESSION: 8.5 cm fluid collection in the subcutaneous tissues of the lateral aspect of the right thigh. This may represent a seroma or abscess. Electronically Signed   By: Greig Pique M.D.   On: 09/30/2023 21:47   DG Knee 1-2 Views Right Result Date: 09/29/2023 CLINICAL DATA:  Knee pain and swelling EXAM: RIGHT KNEE - 1-2 VIEW COMPARISON:  X-ray 01/18/2019 FINDINGS: Moderate joint effusion. Moderate joint space loss of the medial compartment and patellofemoral joint. Tricompartmental osteophytes are seen greatest of the patellofemoral joint. Chondrocalcinosis also identified. There is medial translation of the femur relative to the tibia as well. Preserved bone mineralization. IMPRESSION: Advanced tricompartmental degenerative changes. Moderate joint effusion. Chondrocalcinosis Electronically Signed   By: Ranell Bring M.D.   On: 09/29/2023 15:57   DG Ribs Unilateral Left Result Date: 09/28/2023 CLINICAL DATA:  Left-sided rib pain after cough EXAM: LEFT RIBS - 2 VIEW COMPARISON:  Chest x-ray 09/28/2023.  Chest CT 07/26/2023. FINDINGS: Expansile anterolateral left 6 rib lesion is again noted. No displaced fractures are identified. Lungs are grossly clear. IMPRESSION: Expansile anterolateral left 6 rib lesion is again noted. Correlate for point tenderness. No displaced fractures are identified. Electronically Signed   By: Greig Pique M.D.   On: 09/28/2023 21:50   DG CHEST PORT 1 VIEW Result Date: 09/28/2023 CLINICAL DATA:  Chest pain.  Attention left ribs. EXAM: PORTABLE CHEST 1 VIEW COMPARISON:  08/23/2023, CT 07/26/2023 FINDINGS: Removal of right-sided dialysis catheter and tracheostomy tube. Low lung  volumes persist. Ill-defined opacity at the left lung base likely pleural effusion and airspace disease/atelectasis. Stable heart size and mediastinal contours. Vascular congestion. No pneumothorax. Chronic bilateral shoulder arthropathy. Lucent lesions within the thoracic osseous structures on prior CT, including left rib, not well demonstrated by radiograph. IMPRESSION: 1. Ill-defined opacity at the left lung base likely pleural effusion and airspace disease/atelectasis. 2. Vascular congestion. 3. Lucent lesions within the thoracic osseous structures on prior CT, including left rib, are not well demonstrated by radiograph. Electronically Signed   By: Andrea Gasman M.D.   On: 09/28/2023 16:40   US  IMAGE GUIDED FLUID DRAIN BY CATHETER Result Date: 09/26/2023 CLINICAL DATA:  Large right gluteal fluid collection likely evolving hematoma or potential abscess EXAM: US  IMAGE GUIDED FLUID DRAIN BY CATHETER TECHNIQUE: Ultrasound guidance COMPARISON:  None Available. FINDINGS: With the patient in a supine position, the right gluteal region and hip region were evaluated with ultrasound. A large fluid collection corresponding with previous visualized fluid collection is again seen. The patient was prepped and draped in usual sterile fashion. Local anesthesia was achieved by infiltrating subcutaneous tissue with 1% lidocaine . A small incision was made and then a 7 cm Yueh needle was advanced under ultrasound guidance from the incision to midpoint of the fluid collection. The needle was removed and short Amplatz wire was advanced under ultrasound guidance and coiled within the fluid collection. The Yueh catheter was then removed and the access site was dilated with a 10 French fascial dilator. A 10 French pigtail catheter was then advanced over the guidewire and coiled within the fluid collection. Locking mechanism engaged. Retention suture and sterile dressing applied. Small sample was obtained and sent to pathology for  aerobic and anaerobe evaluation. IMPRESSION: Satisfactory 10 French drainage catheter placed in a right hip abscess. Fluid appears to be infected hematoma. Electronically Signed   By: Cordella Banner   On: 09/26/2023 10:41   CT  HEAD WO CONTRAST ( ) Result Date: 09/25/2023 CLINICAL DATA:  Initial evaluation for acute neuro deficit, stroke suspected. EXAM: CT HEAD WITHOUT CONTRAST TECHNIQUE: Contiguous axial images were obtained from the base of the skull through the vertex without intravenous contrast. RADIATION DOSE REDUCTION: This exam was performed according to the departmental dose-optimization program which includes automated exposure control, adjustment of the mA and/or kV according to patient size and/or use of iterative reconstruction technique. COMPARISON:  CT from 09/22/2023. FINDINGS: Brain: Cerebral volume within normal limits. Patchy hypodensity involving the supratentorial cerebral white matter, most characteristic of chronic microvascular ischemic disease, moderate in nature. No acute intracranial hemorrhage. No acute large vessel territory infarct. No mass lesion, midline shift or mass effect. No hydrocephalus or extra-axial fluid collection. Vascular: No abnormal hyperdense vessel. Calcified atherosclerosis present at the skull base. Skull: Scalp soft tissues demonstrate no acute finding. Multiple lucent lesions again noted throughout the calvarium and visualized skull base, consistent with history of multiple myeloma. Sinuses/Orbits: Globes orbital soft tissues within normal limits. Mild mucosal thickening noted about the right maxillary sinus. Minimal pneumatized secretions noted within the right sphenoid sinus. Large right mastoid and middle ear effusion, with small left mastoid effusion. Imaged nasopharynx grossly unremarkable. Other: None. IMPRESSION: 1. No acute intracranial abnormality. 2. Moderate chronic microvascular ischemic disease. 3. Multiple lucent lesions throughout the  calvarium and visualized skull base, consistent with history of multiple myeloma. 4. Large right mastoid and middle ear effusion, with small left mastoid effusion. Findings of uncertain significance, however, correlation with physical exam for possible acute otomastoiditis recommended. Electronically Signed   By: Morene Hoard M.D.   On: 09/25/2023 20:46   CT HIP RIGHT WO CONTRAST Result Date: 09/25/2023 CLINICAL DATA:  Right hip pain EXAM: CT OF THE RIGHT HIP WITHOUT CONTRAST TECHNIQUE: Multidetector CT imaging of the right hip was performed according to the standard protocol. Multiplanar CT image reconstructions were also generated. RADIATION DOSE REDUCTION: This exam was performed according to the departmental dose-optimization program which includes automated exposure control, adjustment of the mA and/or kV according to patient size and/or use of iterative reconstruction technique. COMPARISON:  CT right femur 08/01/2023 FINDINGS: Bones/Joint/Cartilage Moderate to prominent axial and craniocaudad chondral thinning in the right hip associated with substantial spurring of the right femoral head. Mildly aspherical femoral head morphology may predispose to cam type femoroacetabular impingement. Scattered degenerative subcortical cystic lesions along the right femoral head and acetabulum. Scattered lucencies in the marrow of the visualized bony structures including the right iliac bone, ischium, and right proximal femur. Entities such as myeloma are not excluded. Similar lesions have been reported in other bony structures example in cranial structures on the MRI head from 07/26/2023. Ligaments Suboptimally assessed by CT. Muscles and Tendons Moderate regional muscular atrophy. Soft tissues Extending partially along the superficial fascia margin of the gluteus medius and rectus femoris muscle, there is a large fluid collection which extends above and below the margins of today's CT scan. This measures 12.9 by  6.3 cm in cross-section on image 93 of series 5 and was also present on 08/01/2023, tracking cephalad on that exam all the way up to the level of the iliac crests. Appearance favors chronic hematoma or possibly a Morel-Lavalle lesion given the lack of resolution. Right iliac artery atherosclerosis. IMPRESSION: 1. 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 Mildly aspherical femoral head morphology may predispose to cam type femoroacetabular impingement. 2. Scattered lucencies in the marrow of the visualized bony structures including the right iliac bone,  ischium, and right proximal femur. Entities such as myeloma are not excluded. Similar lesions have been reported in other bony structures example in cranial structures on the MRI head from 07/26/2023. 3. Large fluid collection extending partially along the superficial fascia margin of the gluteus medius and rectus femoris muscle, also present on 08/01/2023, tracking cephalad on that exam all the way up to the level of the iliac crests. Appearance favors chronic hematoma or possibly a Morel-Lavallee lesion given the lack of resolution. 4. Moderate regional muscular atrophy. 5. Right iliac artery atherosclerosis. Electronically Signed   By: Ryan Salvage M.D.   On: 09/25/2023 08:00   CT HEAD WO CONTRAST ( ) Result Date: 09/23/2023 EXAM: CT HEAD WITHOUT CONTRAST 09/22/2023 09:56:16 PM TECHNIQUE: CT of the head was performed without the administration of intravenous contrast. Automated exposure control, iterative reconstruction, and/or weight based adjustment of the mA/kV was utilized to reduce the radiation dose to as low as reasonably achievable. COMPARISON: None available. CLINICAL HISTORY: Transient ischemic attack (TIA). FINDINGS: BRAIN AND VENTRICLES: No acute hemorrhage. Gray-white differentiation is preserved. No hydrocephalus. No extra-axial collection. No mass effect or midline shift. Periventricular and scattered subcortical white matter  hypoattenuation is moderately advanced for age, stable. Mild generalized atrophy is stable. ORBITS: No acute abnormality. SINUSES: Bilateral mastoid effusions are again noted, right greater than left. Minimal right maxillary sinus disease is again seen. Secretions are present within the right sphenoid sinus. SOFT TISSUES AND SKULL: No acute soft tissue abnormality. No skull fracture. A lytic lesion is again noted within a right greater than left clivus. Multiple smaller lytic lesions are present throughout the calvarium, stable. VASCULATURE: Atherosclerotic calcifications are present in the cavernous carotid arteries bilaterally. No hyperdense vessel is present. IMPRESSION: 1. No acute intracranial abnormality related to the clinical history of TIA. 2. Moderately advanced periventricular and scattered subcortical white matter hypoattenuation, stable. 3. Mild generalized atrophy, stable. 4. Bilateral mastoid effusions, right greater than left, and minimal right maxillary sinus disease, again seen. Secretions are present within the right sphenoid sinus. 5. Lytic lesion within a right greater than left clivus and multiple smaller lytic lesions throughout the calvarium, stable. Electronically signed by: Lonni Necessary MD 09/23/2023 05:20 AM EDT RP Workstation: [id number]   DG Humerus Left Result Date: 09/15/2023 CLINICAL DATA:  Myeloma. EXAM: LEFT HUMERUS - 2+ VIEW COMPARISON:  09/01/2023 FINDINGS: Plate and screw fixation of distal humerus. Diffuse bony demineralization of the distal humerus with innumerable cystic lucencies, without significant interval change. Suspected pathologic fracture fixated by a hardware. No new fracture. The proximal humerus is intact. IMPRESSION: Stable radiographic appearance of the distal humerus with plate and screw fixation. Diffusely decreased bone mineralization with innumerable cystic lucencies distally, grossly unchanged from prior. Given provided history of myeloma this is  likely due to diffuse myomatous change, although infection is not excluded on imaging findings alone. Electronically Signed   By: Andrea Gasman M.D.   On: 09/15/2023 15:23    Labs:  CBC: Recent Labs    10/11/23 0328 10/12/23 0533 10/13/23 0610 10/14/23 0452  WBC 5.6 6.1 5.7 6.7  HGB 9.3* 9.9* 10.3* 8.1*  HCT 29.1* 30.8* 32.3* 25.3*  PLT 387 378 371 319    COAGS: Recent Labs    07/05/23 2227 07/07/23 2030 07/10/23 0411 07/26/23 0423 08/13/23 0908 10/02/23 1456 10/02/23 2305 10/03/23 0719  INR 2.1* 1.9*  --   --  1.0  --   --   --   APTT  --  44*   < >  36  --  43* 39* 52*   < > = values in this interval not displayed.    BMP: Recent Labs    10/06/23 0614 10/07/23 0638 10/12/23 0533 10/14/23 0452  NA 134* 132* 131* 131*  K 5.3* 4.6 5.1 5.8*  CL 100 101 101 101  CO2 24 22 20* 21*  GLUCOSE 94 87 126* 146*  BUN 22* 23* 24* 34*  CALCIUM  8.6* 8.6* 8.3* 8.6*  CREATININE 1.01 1.24 1.40* 1.63*  GFRNONAA >60 >60 58* 48*    LIVER FUNCTION TESTS: Recent Labs    10/04/23 0328 10/05/23 0533 10/06/23 0614 10/07/23 0638 10/12/23 0533 10/14/23 0452  BILITOT 0.6 0.2 0.4 0.5  --   --   AST 9* 8* 10* 9*  --   --   ALT 6 6 7 5   --   --   ALKPHOS 78 84 89 83  --   --   PROT 5.2* 5.3* 5.5* 5.5*  --   --   ALBUMIN  2.0* 2.0* 2.2* 2.1* 2.3* 2.5*    TUMOR MARKERS: No results for input(s): AFPTM, CEA, CA199, CHROMGRNA in the last 8760 hours.  Assessment and Plan:  Frank Creeden. is a 58 y.o. male with hx of OSA, uses CPAP, multiple myeloma, A-fib on Eliquis , hyperlipidemia, tobacco use disorder. Has had complicated hospital stay with sepsis, pneumonia, encephalopathy. Was in the ICU 36 days. Now awake, alert and much improved. 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 Oncology now referring patient to IR service for  port-a-catheter placement to begin chemotherapy.   Risks and benefits of image-guided Port-a-catheter placement were discussed with the patient including, but not limited to bleeding, infection, pneumothorax, or fibrin sheath development and need for additional procedures. All of the patient's questions were answered, patient is agreeable to proceed. Consent signed and in chart.   Thank you for allowing our service to participate in Frank [name]. 's care.  Electronically Signed: Kimble VEAR Clas, PA-C   10/14/2023, 10:02 AM      I spent a total of 20 Minutes    in face to face in clinical consultation, greater than 50% of which was counseling/coordinating care for port-a-catheter placement.

## 2023-10-14 NOTE — Plan of Care (Signed)
  Problem: Education: Goal: Knowledge of General Education information will improve Description: Including pain rating scale, medication(s)/side effects and non-pharmacologic comfort measures Outcome: Progressing   Problem: Health Behavior/Discharge Planning: Goal: Ability to manage health-related needs will improve Outcome: Progressing   Problem: Clinical Measurements: Goal: Ability to maintain clinical measurements within normal limits will improve Outcome: Progressing Goal: Will remain free from infection Outcome: Progressing Goal: Diagnostic test results will improve Outcome: Progressing Goal: Respiratory complications will improve Outcome: Progressing Goal: Cardiovascular complication will be avoided Outcome: Progressing   Problem: Activity: Goal: Risk for activity intolerance will decrease Outcome: Progressing   Problem: Nutrition: Goal: Adequate nutrition will be maintained Outcome: Progressing   Problem: Coping: Goal: Level of anxiety will decrease Outcome: Progressing   Problem: Elimination: Goal: Will not experience complications related to bowel motility Outcome: Progressing Goal: Will not experience complications related to urinary retention Outcome: Progressing   Problem: Pain Managment: Goal: General experience of comfort will improve and/or be controlled Outcome: Progressing   Problem: Safety: Goal: Ability to remain free from injury will improve Outcome: Progressing   Problem: Skin Integrity: Goal: Risk for impaired skin integrity will decrease Outcome: Progressing   Problem: Education: Goal: Ability to describe self-care measures that may prevent or decrease complications (Diabetes Survival Skills Education) will improve Outcome: Progressing Goal: Individualized Educational Video(s) Outcome: Progressing   Problem: Coping: Goal: Ability to adjust to condition or change in health will improve Outcome: Progressing   Problem: Fluid  Volume: Goal: Ability to maintain a balanced intake and output will improve Outcome: Progressing   Problem: Health Behavior/Discharge Planning: Goal: Ability to identify and utilize available resources and services will improve Outcome: Progressing Goal: Ability to manage health-related needs will improve Outcome: Progressing   Problem: Metabolic: Goal: Ability to maintain appropriate glucose levels will improve Outcome: Progressing   Problem: Nutritional: Goal: Maintenance of adequate nutrition will improve Outcome: Progressing Goal: Progress toward achieving an optimal weight will improve Outcome: Progressing   Problem: Skin Integrity: Goal: Risk for impaired skin integrity will decrease Outcome: Progressing   Problem: Tissue Perfusion: Goal: Adequacy of tissue perfusion will improve Outcome: Progressing   Problem: Education: Goal: Knowledge of General Education information will improve Description: Including pain rating scale, medication(s)/side effects and non-pharmacologic comfort measures Outcome: Progressing   Problem: Health Behavior/Discharge Planning: Goal: Ability to manage health-related needs will improve Outcome: Progressing   Problem: Clinical Measurements: Goal: Ability to maintain clinical measurements within normal limits will improve Outcome: Progressing Goal: Will remain free from infection Outcome: Progressing Goal: Diagnostic test results will improve Outcome: Progressing Goal: Respiratory complications will improve Outcome: Progressing Goal: Cardiovascular complication will be avoided Outcome: Progressing   Problem: Activity: Goal: Risk for activity intolerance will decrease Outcome: Progressing   Problem: Nutrition: Goal: Adequate nutrition will be maintained Outcome: Progressing   Problem: Coping: Goal: Level of anxiety will decrease Outcome: Progressing   Problem: Elimination: Goal: Will not experience complications related to  bowel motility Outcome: Progressing Goal: Will not experience complications related to urinary retention Outcome: Progressing   Problem: Pain Managment: Goal: General experience of comfort will improve and/or be controlled Outcome: Progressing   Problem: Safety: Goal: Ability to remain free from injury will improve Outcome: Progressing   Problem: Skin Integrity: Goal: Risk for impaired skin integrity will decrease Outcome: Progressing

## 2023-10-14 NOTE — Progress Notes (Signed)
 [name] has a left arm disarticulated yesterday.  Everything went well.  His hemoglobin is down to 8.1.  He needs to have a transfusion.  There is pain in the left arm.  I am not surprised by this.  He still has a drainage tube in the right hip.  All cultures have been negative.  Hopefully, this will come out soon.  I do not know if he needs to have another CT scan to see how the fluid collection is resolving.  He definitely needs to have a Port-A-Cath put in.  Again, he has had no positive cultures.  The Port-A-Cath is critical I think.  He has very, very poor IV access peripherally.  I need to get his chemotherapy restarted probably next week.  His labs show sodium 131.  Potassium 5.8.  BUN 34 creatinine 1.63.  Calcium  8.6.  Albumin  2.5.  His white cell count 6.7.  Hemoglobin 8.1.  Platelet count 319,000.  He is eating okay.  There is no nausea or vomiting.  He has had no cough.  He has had no chest pain.  He has had no recent diarrhea.  He is now back on Eliquis .  He is still on voriconazole .  I am not sure how much longer he needs to be on this.   His vital signs are stable.  His temperature 98.1.  Pulse 102.  Blood pressure 123/90.  Head and neck exam shows no ocular or oral lesions.  He has no palpable cervical supraclavicular lymph nodes.  Lungs are clear bilaterally.  He has good air movement bilaterally.  Cardiac exam regular rate and rhythm.  There are no murmurs.  Abdomen is soft.  He is somewhat obese.  He has no fluid wave.  There is no palpable liver or spleen tip.  Extremity shows the disarticulated left arm.  His lower extremities are unremarkable.  Neurological exam is nonfocal.  We will give him 2 units of blood today.  I think this is helpful.  His potassium is on the high side.  I would just watch this for now.  Again, he needs to have a Port-A-Cath put in.  Hopefully, IR will be able to do this this week sometime.  I have a is getting incredible care for everybody upon  5C.  He is very pleased with the care that he is getting.   Jeralyn Crease, MD  [place] 29:11

## 2023-10-14 NOTE — Progress Notes (Signed)
 PROGRESS NOTE  Frank [name]. FMW:[id number] DOB: 09/29/1965   PCP: [name], MD  Patient is from: Home.  DOA: 07/03/2023 LOS: 103  Chief complaints No chief complaint on file.    Brief Narrative / Interim history: 58 year old PMH OSA CPAP, pathological fracture and multiple ORIF left Humerus likely due to multiple myeloma, A-fib not on anticoagulation, hyperlipidemia, tobacco use disorder who was feeling weak for 1 week, admitted to [facility] with concern of sepsis, community-acquired pneumonia and acute encephalopathy. Patient was found to have lytic lesions, he was transferred to [facility] for oncology treatment. In the meantime he developed encephalopathy, acute renal failure. He remained in the ICU for 36 days. Underwent tracheostomy. He was also started on HD after CRRT,off of HD now. Subsequently PEG tube placed 5/29 and IR drain placement of right thigh hip infected hematoma on 7/11 . S/p PEG tube removal. Needs Port-A-Cath placement (for outpatient chemo), either during this hospitalization or as an outpatient. F/u IR recommendations for drain management. Started on heparin  drip on 7/17 because of b/l LE DVT.  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 on 7/28.  Surgical EBL 750 cc. Oncology and IR following.  Subjective: Seen and examined earlier this morning.  No major events overnight of this morning.  Pain fairly controlled.  No complaints  Objective: Vitals:   10/14/23 0400 10/14/23 0500 10/14/23 0746 10/14/23 0838  BP: (!) 123/90  (!) 138/92   Pulse: (!) 102  (!) 106   Resp: 16  18   Temp: 98.1 F (36.7 C)  98.6 F (37 C)   TempSrc: Oral     SpO2: 96%  96% 96%  Weight:  107 kg    Height:        Examination:  GENERAL: No apparent distress.  Nontoxic. HEENT: MMM.  Vision and hearing grossly intact.  NECK: Supple.  No apparent JVD.  RESP:  No IWOB.  Fair aeration bilaterally. CVS:  RRR. Heart sounds normal.   ABD/GI/GU: BS+. Abd soft, NTND.  MSK/EXT:  Moves extremities.  Left arm amputation.  Dressing DCI. SKIN: no apparent skin lesion or wound NEURO: Sleepy but wakes to voice.  Oriented x 4.  BLE weakness. PSYCH: Calm. Normal affect.   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 on 7/28. - Pain control.  Infected right flank/gluteal hematoma -Underwent IR drain placement for infected Hematoma on 7/11.  -Drain exchanged on 7/25. -Completed antibiotic course on 7/26 -IR following.   Sepsis in the setting of Staph Epi Bacteremia -blood culture with MRSE in 1 out of 4 bottles.  Completed a course of vancomycin  with the end date 6/20.  He did receive a line holiday.  Repeat blood culture 6/9 without growth.  Completed thereabouts on 7/26   Multiple myeloma: Started chemo with Velcade /Cytoxan  on 08/21/2023.  Chemo changed to Sarclisa /Kyprolis /Cytoxan  later on.  Currently on hold pending left arm amputation.   -Myeloma responded well to treatment, IgG decreased from [phone] (normal range). -Oncology, Dr [name] following and hoping to resume treatment as soon as possible -IR for Port-A-Cath placement   AKI on CKD-3A/uremia: due to MM, hypercalcemia and ACE inhibitors?  Briefly required hemodialysis.  AKI resolved and TDC removed.  Cr started to trend up again likely for  multiple myeloma. - Continue monitoring  BLE DVT: US  venous doppler on 7/16 positive for DVT in his RLE PTV, left CFV, SFJ, and PFV. - Now on Eliquis .  Left sided pleuritic chest wall pain:  No rib fractures seen on CXR - Analgesics as needed.   Aspergillosis with pneumonia  -Continue voriconazole .  End date on 8/5.   Acute Hypoxic Respiratory Failure-trach removed 6/11.  Decannulated.    Severe acute metabolic encephalopathy with hypoactive delirium, seizure disorder - Normal MRI of the brain.  LP negative for meningitis or encephalitis.  EEG negative for seizures but encephalopathy.  -Resolved  Acute postoperative blood loss anemia superimposed on anemia of renal disease: Reported EBL 750 cc. Recent Labs    10/05/23 0533 10/06/23 9385 10/07/23 9361 10/08/23 0530 10/09/23 0443 10/10/23 0420 10/11/23 0328 10/12/23 0533 10/13/23 0610 10/14/23 0452  HGB 8.9* 9.4* 9.6* 9.5* 9.5* 10.5* 9.3* 9.9* 10.3* 8.1*  - Hematology transfusing 2 units. - Continue monitoring   IDDM-2 with hyperglycemia- A1c 6.0%. -Continue with sliding scale insulin .    Paroxysmal A-fib:: Rate controlled. -Continue home verapamil  -Eliquis  for anticoagulation.  Essential hypertension: Normotensive. - Continue home verapamil    Obstructive Sleep Apnea -Trach removed. CPAP    Anxiety/Bipolar disorder - Continue with Paxil .     Hypokalemia/hyperkalemia: K5.8. - P.o. Lokelma  10 g x 1 - Recheck in the morning  Hyponatremia: Mild and stable.  Dysphagia: Resolved.  PEG tube removed.  On regular diet.  Severe malnutrition/class I obesity Body mass index is 31.99 kg/m. Nutrition Problem: Severe Malnutrition Etiology: acute illness (prolonged illness, interuptions in feeding) Signs/Symptoms: moderate fat depletion, moderate muscle depletion, percent weight loss (9.6% x 1 month) Percent weight loss: 9.6 % Interventions: Ensure Enlive (each supplement provides 350kcal and 20 grams of protein), MVI, Liberalize Diet, Prostat, Juven  Pressure skin injury: Pressure Injury 07/05/23 Heel Left;Right Unstageable - Full thickness tissue loss in which the base of the injury is covered by slough (yellow, tan, gray, green or brown) and/or eschar (tan, brown or black) in the wound bed. (Active)  07/05/23 1108  Location: Heel  Location Orientation: Left;Right  Staging: Unstageable - Full thickness tissue  loss in which the base of the injury is covered by slough (yellow, tan, gray, green or brown) and/or eschar (tan, brown or black) in the wound bed.  Wound Description (Comments):   DO NOT USE:  Present on Admission: Yes  Dressing Type None 10/12/23 1935     Pressure Injury 07/11/23 Sacrum Mid Unstageable - Full thickness tissue loss in which the base of the injury is covered by slough (yellow, tan, gray, green or brown) and/or eschar (tan, brown or black) in the wound bed. (Active)  07/11/23 1600  Location: Sacrum  Location Orientation: Mid  Staging: Unstageable - Full thickness tissue loss in which the base of the injury is covered by slough (yellow, tan, gray, green or brown) and/or eschar (tan, brown or black) in the wound bed.  Wound Description (Comments):   DO NOT USE:  Present on Admission: No  Dressing Type Gauze (Comment);Santyl ;Barrier Film (skin prep);Foam - Lift dressing to assess site every shift 10/07/23 1400     Pressure Injury 07/27/23 Buttocks Right;Lower (Active)  07/27/23 1700  Location: Buttocks  Location Orientation: Right;Lower  Staging:   Wound Description (Comments):   DO NOT USE:  Present on Admission: Yes  Dressing Type None 10/13/23 0515     Pressure Injury 08/25/23 Buttocks Left Stage 2 -  Partial thickness loss of dermis presenting  as a shallow open injury with a red, pink wound bed without slough. (Active)  08/25/23   Location: Buttocks  Location Orientation: Left  Staging: Stage 2 -  Partial thickness loss of dermis presenting as a shallow open injury with a red, pink wound bed without slough.  Wound Description (Comments):   DO NOT USE:  Present on Admission: No  Dressing Type None 10/13/23 0515   DVT prophylaxis:  SCDs Start: 10/13/23 1123 Place and maintain sequential compression device Start: 07/14/23 1033 apixaban  (ELIQUIS ) tablet 5 mg  Code Status: Full code Family Communication: None at bedside today. Level of care: Telemetry Medical Status  is: Inpatient due to acute illness   Final disposition: SNF   35 minutes with more than 50% spent in reviewing records, counseling patient/family and coordinating care.   Sch Meds:  Scheduled Meds:  (feeding supplement) PROSource Plus  30 mL Oral TID BM   sodium chloride    Intravenous Once   acyclovir   400 mg Oral BID   apixaban   5 mg Oral BID   collagenase    Topical Daily   docusate sodium   100 mg Oral BID   epoetin  alfa  40,000 Units Subcutaneous Q Wed-1800   feeding supplement  237 mL Oral BID WC   furosemide   20 mg Intravenous Once   furosemide   20 mg Intravenous Once   guaiFENesin   600 mg Oral BID   hydrALAZINE   25 mg Oral Q8H   insulin  aspart  0-15 Units Subcutaneous TID AC & HS   insulin  aspart  2 Units Subcutaneous TID AC & HS   insulin  glargine-yfgn  10 Units Subcutaneous Q24H   lidocaine   1 patch Transdermal Q24H   montelukast   5 mg Oral QHS   multivitamin with minerals  1 tablet Oral Daily   nutrition supplement (JUVEN)  1 packet Oral BID AC   mouth rinse  15 mL Mouth Rinse 4 times per day   pantoprazole   40 mg Oral BID   PARoxetine   40 mg Oral Daily   polyethylene glycol  17 g Oral BID   senna  1 tablet Oral Daily   sodium chloride  flush  10-40 mL Intracatheter Q12H   umeclidinium-vilanterol  1 puff Inhalation Daily   valproic  acid  750 mg Oral BID   verapamil   120 mg Oral Daily   voriconazole   400 mg Oral Q12H   Continuous Infusions:  sodium chloride  Stopped (10/13/23 0952)   sodium chloride  Stopped (08/28/23 0839)   PRN Meds:.acetaminophen  (TYLENOL ) oral liquid 160 mg/5 mL **OR** acetaminophen , alum & mag hydroxide-simeth, benzonatate , cyclobenzaprine , HYDROmorphone  (DILAUDID ) injection, hydrOXYzine , ipratropium-albuterol , metoCLOPramide  **OR** metoCLOPramide  (REGLAN ) injection, ondansetron  (ZOFRAN ) IV, oxyCODONE , polyethylene glycol, artificial tears, sodium chloride  flush  Antimicrobials: Anti-infectives (From admission, onward)    Start     Dose/Rate  Route Frequency Ordered Stop   10/13/23 1400  ceFAZolin  (ANCEF ) IVPB 2g/100 mL premix        2 g 200 mL/hr over 30 Minutes Intravenous Every 8 hours 10/13/23 1254 10/14/23 0618   10/13/23 0937  vancomycin  (VANCOCIN ) powder  Status:  Discontinued          As needed 10/13/23 0937 10/13/23 1002   10/10/23 0900  doxycycline  200mg  in 20 mL SWI irrigation / sclerosing solution for IR       Note to Pharmacy: For irrigation only. WARNING - SCLEROSING AGENT  #5 vials sent to IR for procedure   500 mg Irrigation To Radiology 10/07/23 1418 10/10/23 1015   10/10/23 0000  doxycycline  (VIBRAMYCIN ) 500 mg in dextrose  5 % 250 mL IVPB  Status:  Discontinued        500 mg 125 mL/hr over 120 Minutes Intravenous To Radiology 10/07/23 1402 10/07/23 1409   10/10/23 0000  doxycycline  200mg  in 20 mL SWI irrigation / sclerosing solution for IR  Status:  Discontinued        200 mg Irrigation To Radiology 10/07/23 1418 10/07/23 1456   10/10/23 0000  doxycycline  200mg  in 20 mL SWI irrigation / sclerosing solution for IR  Status:  Discontinued        200 mg Irrigation To Radiology 10/07/23 1440 10/07/23 1456   10/02/23 2200  linezolid  (ZYVOX ) tablet 600 mg        600 mg Oral Every 12 hours 10/01/23 0852 10/09/23 2108   10/02/23 1000  linezolid  (ZYVOX ) tablet 600 mg  Status:  Discontinued        600 mg Oral Every 12 hours 10/01/23 0723 10/01/23 0852   09/30/23 1300  vancomycin  variable dose per unstable renal function (pharmacist dosing)  Status:  Discontinued         Does not apply See admin instructions 09/30/23 1301 10/01/23 0725   09/29/23 1145  amoxicillin -clavulanate (AUGMENTIN ) 875-125 MG per tablet 1 tablet        1 tablet Oral Every 12 hours 09/29/23 1047 10/09/23 2108   09/27/23 0200  vancomycin  (VANCOREADY) IVPB 1500 mg/300 mL        1,500 mg 150 mL/hr over 120 Minutes Intravenous Every 12 hours 09/26/23 1402 09/30/23 1448   09/26/23 1500  cefTRIAXone  (ROCEPHIN ) 2 g in sodium chloride  0.9 % 100 mL IVPB   Status:  Discontinued        2 g 200 mL/hr over 30 Minutes Intravenous Every 24 hours 09/26/23 1402 09/29/23 1047   09/26/23 1315  vancomycin  (VANCOREADY) IVPB 2000 mg/400 mL        2,000 mg 200 mL/hr over 120 Minutes Intravenous  Once 09/26/23 1220 09/26/23 1656   09/26/23 1230  metroNIDAZOLE  (FLAGYL ) IVPB 500 mg  Status:  Discontinued        500 mg 100 mL/hr over 60 Minutes Intravenous Every 12 hours 09/26/23 1137 09/29/23 1138   09/22/23 2200  voriconazole  (VFEND ) tablet 400 mg        400 mg Oral Every 12 hours 09/22/23 1521 10/21/23 2359   09/11/23 2200  acyclovir  (ZOVIRAX ) 200 MG capsule 400 mg       Note to Pharmacy: Give after dialysis the pm dose, on dialysis days   400 mg Oral 2 times daily 09/11/23 1551     09/11/23 2200  voriconazole  (VFEND ) tablet 350 mg  Status:  Discontinued        350 mg Oral Every 12 hours 09/11/23 1551 09/22/23 1521   09/07/23 1000  acyclovir  (ZOVIRAX ) 200 MG capsule 400 mg  Status:  Discontinued       Note to Pharmacy: Give after dialysis the pm dose, on dialysis days   400 mg Per Tube 2 times daily 09/07/23 0825 09/11/23 1551   09/03/23 1200  vancomycin  (VANCOCIN ) IVPB 1000 mg/200 mL premix  Status:  Discontinued        1,000 mg 200 mL/hr over 60 Minutes Intravenous Every M-W-F (Hemodialysis) 09/02/23 1021 09/03/23 0942   09/03/23 0942  vancomycin  variable dose per unstable renal function (pharmacist dosing)  Status:  Discontinued         Does not apply See admin instructions 09/03/23 0942 09/03/23 0945  09/02/23 0915  vancomycin  (VANCOCIN ) IVPB 1000 mg/200 mL premix        1,000 mg 200 mL/hr over 60 Minutes Intravenous  Once 09/02/23 0830 09/02/23 1215   09/01/23 0834  ceFAZolin  (ANCEF ) IVPB 2g/100 mL premix        over 30 Minutes Intravenous Continuous PRN 09/01/23 0834 09/01/23 0834   08/29/23 0914  vancomycin  variable dose per unstable renal function (pharmacist dosing)  Status:  Discontinued         Does not apply See admin instructions 08/29/23  0914 09/02/23 1021   08/27/23 1400  vancomycin  (VANCOCIN ) IVPB 1000 mg/200 mL premix        1,000 mg 200 mL/hr over 60 Minutes Intravenous  Once 08/27/23 0920 08/27/23 1653   08/25/23 1200  vancomycin  (VANCOCIN ) IVPB 1000 mg/200 mL premix        1,000 mg 200 mL/hr over 60 Minutes Intravenous Every M-W-F (Hemodialysis) 08/24/23 1405 08/29/23 1240   08/24/23 1445  vancomycin  (VANCOREADY) IVPB 2000 mg/400 mL        2,000 mg 200 mL/hr over 120 Minutes Intravenous  Once 08/24/23 1353 08/25/23 0323   08/14/23 0600  ceFAZolin  (ANCEF ) IVPB 2g/100 mL premix        2 g 200 mL/hr over 30 Minutes Intravenous To Radiology 08/13/23 1055 08/14/23 0606   08/09/23 1000  acyclovir  (ZOVIRAX ) 200 MG capsule 200 mg  Status:  Discontinued       Note to Pharmacy: Give after dialysis the pm dose, on dialysis days   200 mg Per Tube 2 times daily 08/08/23 2358 09/07/23 0825   08/09/23 0100  acyclovir  (ZOVIRAX ) 200 MG capsule 200 mg       Note to Pharmacy: Give after dialysis the pm dose, on dialysis days   200 mg Per Tube  Once 08/09/23 0009 08/09/23 0013   08/08/23 2345  acyclovir  (ZOVIRAX ) 200 MG capsule 200 mg  Status:  Discontinued       Note to Pharmacy: Give after dialysis the pm dose, on dialysis days   200 mg Oral 2 times daily 08/08/23 2257 08/08/23 2358   08/07/23 1530  ceFAZolin  (ANCEF ) IVPB 2g/100 mL premix        over 30 Minutes Intravenous Continuous PRN 08/07/23 1555 08/07/23 1530   08/05/23 2200  voriconazole  (VFEND ) tablet 350 mg  Status:  Discontinued        350 mg Per Tube Every 12 hours 08/05/23 1511 09/11/23 1551   08/01/23 1200  vancomycin  (VANCOCIN ) IVPB 1000 mg/200 mL premix        1,000 mg 200 mL/hr over 60 Minutes Intravenous Every M-W-F (Hemodialysis) 07/31/23 0649 08/01/23 1652   07/30/23 1200  vancomycin  (VANCOCIN ) IVPB 1000 mg/200 mL premix        1,000 mg 200 mL/hr over 60 Minutes Intravenous Every M-W-F (Hemodialysis) 07/30/23 0851 07/30/23 1241   07/29/23 0900  vancomycin   (VANCOCIN ) IVPB 1000 mg/200 mL premix        1,000 mg 200 mL/hr over 60 Minutes Intravenous  Once 07/29/23 0812 07/29/23 0943   07/28/23 1204  vancomycin  variable dose per unstable renal function (pharmacist dosing)  Status:  Discontinued         Does not apply See admin instructions 07/28/23 1204 07/31/23 0649   07/28/23 1000  voriconazole  (VFEND ) 350 mg in sodium chloride  0.9 % 100 mL IVPB  Status:  Discontinued       Placed in Followed by Linked Group   4 mg/kg  88.6 kg (  Adjusted) 67.5 mL/hr over 120 Minutes Intravenous Every 12 hours 07/28/23 0748 08/05/23 1511   07/27/23 1000  voriconazole  (VFEND ) 400 mg in sodium chloride  0.9 % 100 mL IVPB  Status:  Discontinued       Placed in Followed by Linked Group   4 mg/kg  100.6 kg 70 mL/hr over 120 Minutes Intravenous Every 12 hours 07/26/23 0826 07/28/23 0748   07/27/23 0945  vancomycin  (VANCOREADY) IVPB 2000 mg/400 mL        2,000 mg 200 mL/hr over 120 Minutes Intravenous  Once 07/27/23 0849 07/27/23 1237   07/26/23 1515  Ampicillin -Sulbactam (UNASYN ) 3 g in sodium chloride  0.9 % 100 mL IVPB  Status:  Discontinued        3 g 200 mL/hr over 30 Minutes Intravenous Every 8 hours 07/26/23 1418 07/27/23 0830   07/26/23 0915  voriconazole  (VFEND ) 600 mg in sodium chloride  0.9 % 150 mL IVPB       Placed in Followed by Linked Group   6 mg/kg  100.6 kg 105 mL/hr over 120 Minutes Intravenous Every 12 hours 07/26/23 0826 07/27/23 0048   07/26/23 0900  Ampicillin -Sulbactam (UNASYN ) 3 g in sodium chloride  0.9 % 100 mL IVPB  Status:  Discontinued        3 g 200 mL/hr over 30 Minutes Intravenous Every 12 hours 07/26/23 0826 07/26/23 1418   07/23/23 1000  levofloxacin  (LEVAQUIN ) IVPB 500 mg  Status:  Discontinued       Placed in Followed by Linked Group   500 mg 100 mL/hr over 60 Minutes Intravenous Every 24 hours 07/22/23 1213 07/23/23 1001   07/23/23 1000  levofloxacin  (LEVAQUIN ) IVPB 500 mg  Status:  Discontinued       Placed in Followed  by Linked Group   500 mg 100 mL/hr over 60 Minutes Intravenous Every 24 hours 07/23/23 1001 07/28/23 0804   07/22/23 1300  levofloxacin  (LEVAQUIN ) IVPB 750 mg       Placed in Followed by Linked Group   750 mg 100 mL/hr over 90 Minutes Intravenous  Once 07/22/23 1213 07/22/23 1542   07/21/23 1600  voriconazole  (VFEND ) 300 mg in sodium chloride  0.9 % 100 mL IVPB  Status:  Discontinued        300 mg 65 mL/hr over 120 Minutes Intravenous Every 12 hours 07/21/23 1430 07/22/23 1415   07/21/23 1000  voriconazole  (VFEND ) tablet 200 mg  Status:  Discontinued        200 mg Oral Every 12 hours 07/20/23 0944 07/21/23 1015   07/20/23 1030  voriconazole  (VFEND ) 530 mg in sodium chloride  0.9 % 150 mL IVPB        6 mg/kg  88.6 kg (Adjusted) 101.5 mL/hr over 120 Minutes Intravenous Every 12 hours 07/20/23 0944 07/21/23 0030   07/19/23 1400  meropenem  (MERREM ) 1 g in sodium chloride  0.9 % 100 mL IVPB  Status:  Discontinued        1 g 200 mL/hr over 30 Minutes Intravenous Every 8 hours 07/19/23 1143 07/22/23 1213   07/18/23 0600  meropenem  (MERREM ) 1 g in sodium chloride  0.9 % 100 mL IVPB  Status:  Discontinued        1 g 200 mL/hr over 30 Minutes Intravenous Every 24 hours 07/17/23 0916 07/19/23 1143   07/17/23 0700  meropenem  (MERREM ) 1 g in sodium chloride  0.9 % 100 mL IVPB  Status:  Discontinued        1 g 200 mL/hr over 30 Minutes Intravenous Every 12  hours 07/17/23 0601 07/17/23 0916   07/15/23 1300  vancomycin  (VANCOCIN ) IVPB 1000 mg/200 mL premix  Status:  Discontinued        1,000 mg 200 mL/hr over 60 Minutes Intravenous Every 24 hours 07/14/23 1129 07/22/23 1319   07/14/23 1215  vancomycin  (VANCOREADY) IVPB 1750 mg/350 mL        1,750 mg 175 mL/hr over 120 Minutes Intravenous  Once 07/14/23 1129 07/14/23 1449   07/09/23 1800  piperacillin -tazobactam (ZOSYN ) IVPB 3.375 g  Status:  Discontinued        3.375 g 12.5 mL/hr over 240 Minutes Intravenous Every 12 hours 07/09/23 0756 07/09/23 0844    07/09/23 1400  piperacillin -tazobactam (ZOSYN ) IVPB 3.375 g  Status:  Discontinued        3.375 g 12.5 mL/hr over 240 Minutes Intravenous Every 8 hours 07/09/23 0844 07/17/23 0557   07/08/23 1300  piperacillin -tazobactam (ZOSYN ) IVPB 3.375 g  Status:  Discontinued        3.375 g 12.5 mL/hr over 240 Minutes Intravenous Every 8 hours 07/08/23 1129 07/09/23 0756   07/05/23 2200  azithromycin  (ZITHROMAX ) 500 mg in sodium chloride  0.9 % 250 mL IVPB        500 mg 250 mL/hr over 60 Minutes Intravenous Daily at bedtime 07/05/23 2022 07/06/23 2302   07/05/23 2115  azithromycin  (ZITHROMAX ) 250 mg in dextrose  5 % 125 mL IVPB  Status:  Discontinued       Note to Pharmacy: Missed dose this morning   250 mg 127.5 mL/hr over 60 Minutes Intravenous Every 24 hours 07/05/23 2016 07/05/23 2021   07/04/23 0900  fluconazole  (DIFLUCAN ) IVPB 200 mg  Status:  Discontinued        200 mg 100 mL/hr over 60 Minutes Intravenous Every 48 hours 07/04/23 0722 07/09/23 0809   07/03/23 0600  cefTRIAXone  (ROCEPHIN ) 1 g in sodium chloride  0.9 % 100 mL IVPB  Status:  Discontinued        1 g 200 mL/hr over 30 Minutes Intravenous Every 24 hours 07/03/23 0508 07/08/23 1104   07/03/23 0600  azithromycin  (ZITHROMAX ) tablet 250 mg  Status:  Discontinued        250 mg Oral Daily 07/03/23 0508 07/05/23 2016        I have personally reviewed the following labs and images: CBC: Recent Labs  Lab 10/10/23 0420 10/11/23 0328 10/12/23 0533 10/13/23 0610 10/14/23 0452  WBC 5.5 5.6 6.1 5.7 6.7  HGB 10.5* 9.3* 9.9* 10.3* 8.1*  HCT 32.5* 29.1* 30.8* 32.3* 25.3*  MCV 107.3* 105.8* 106.2* 106.3* 106.8*  PLT 417* 387 378 371 319   BMP &GFR Recent Labs  Lab 10/12/23 0533 10/14/23 0452  NA 131* 131*  K 5.1 5.8*  CL 101 101  CO2 20* 21*  GLUCOSE 126* 146*  BUN 24* 34*  CREATININE 1.40* 1.63*  CALCIUM  8.3* 8.6*  MG 1.8 2.0  PHOS 3.6 4.6   Estimated Creatinine Clearance: 61.7 mL/min (A) (by C-G formula based on SCr of  1.63 mg/dL (H)). Liver & Pancreas: Recent Labs  Lab 10/12/23 0533 10/14/23 0452  ALBUMIN  2.3* 2.5*   No results for input(s): LIPASE, AMYLASE in the last 168 hours. No results for input(s): AMMONIA in the last 168 hours. Diabetic: No results for input(s): HGBA1C in the last 72 hours. Recent Labs  Lab 10/13/23 1129 10/13/23 1648 10/13/23 2102 10/14/23 0858 10/14/23 1141  GLUCAP 155* 184* 155* 172* 99   Cardiac Enzymes: No results for input(s): CKTOTAL, CKMB, CKMBINDEX, TROPONINI  in the last 168 hours. No results for input(s): PROBNP in the last 8760 hours. Coagulation Profile: No results for input(s): INR, PROTIME in the last 168 hours. Thyroid Function Tests: No results for input(s): TSH, T4TOTAL, FREET4, T3FREE, THYROIDAB in the last 72 hours. Lipid Profile: No results for input(s): CHOL, HDL, LDLCALC, TRIG, CHOLHDL, LDLDIRECT in the last 72 hours. Anemia Panel: No results for input(s): VITAMINB12, FOLATE, FERRITIN, TIBC, IRON, RETICCTPCT in the last 72 hours. Urine analysis:    Component Value Date/Time   COLORURINE AMBER (A) 08/23/2023 0618   APPEARANCEUR CLOUDY (A) 08/23/2023 0618   LABSPEC 1.017 08/23/2023 0618   PHURINE 5.0 08/23/2023 0618   GLUCOSEU NEGATIVE 08/23/2023 0618   HGBUR NEGATIVE 08/23/2023 0618   BILIRUBINUR NEGATIVE 08/23/2023 0618   KETONESUR NEGATIVE 08/23/2023 0618   PROTEINUR 30 (A) 08/23/2023 0618   NITRITE NEGATIVE 08/23/2023 0618   LEUKOCYTESUR LARGE (A) 08/23/2023 0618   Sepsis Labs: Invalid input(s): PROCALCITONIN, LACTICIDVEN  Microbiology: Recent Results (from the past 240 hours)  Aerobic/Anaerobic Culture w Gram Stain (surgical/deep wound)     Status: None (Preliminary result)   Collection Time: 10/13/23  8:30 AM   Specimen: PATH Benign ortho; Tissue  Result Value Ref Range Status   Specimen Description TISSUE  Final   Special Requests A  Final   Gram Stain NO WBC  SEEN NO ORGANISMS SEEN   Final   Culture   Final    NO GROWTH 1 DAY Performed at [facility], [address], [place], [place] [address]    Report Status PENDING  Incomplete  Aerobic/Anaerobic Culture w Gram Stain (surgical/deep wound)     Status: None (Preliminary result)   Collection Time: 10/13/23  8:36 AM   Specimen: PATH Benign ortho; Tissue  Result Value Ref Range Status   Specimen Description TISSUE  Final   Special Requests B  Final   Gram Stain NO WBC SEEN NO ORGANISMS SEEN   Final   Culture   Final    NO GROWTH 1 DAY Performed at [facility], [address] [place], [place] [address]    Report Status PENDING  Incomplete    Radiology Studies: No results found.     Amberlynn Tempesta T. Adileny Delon [facility]  If 7PM-7AM, please contact night-coverage [email or web address] 10/14/2023, 1:35 PM

## 2023-10-14 NOTE — Progress Notes (Signed)
 OT Cancellation Note  Patient Details Name: Frank [name]. MRN: [id number] DOB: January 09, 1966   Cancelled Treatment:    Reason Eval/Treat Not Completed: Pain limiting ability to participate;Other (comment). Pt politely declined today staying that he was 7/10 pain in L UE (pt had been pre medicated) and had not eaten lunch and would rather have therapy tomorrow. OT will follow up tomorrow  Jacques Karna Loose 10/14/2023, 1:50 PM

## 2023-10-14 NOTE — Progress Notes (Signed)
 Orthopaedic Trauma Progress Note  SUBJECTIVE: Patient in good spirits.  Doing okay this morning.  Continues to note moderate to severe pain in the left arm.  Also experiencing some phantom limb pain.  No chest pain. No SOB. No nausea/vomiting. No other complaints.    OBJECTIVE:  Vitals:   10/14/23 0746 10/14/23 0838  BP: (!) 138/92   Pulse: (!) 106   Resp: 18   Temp: 98.6 F (37 C)   SpO2: 96% 96%    Opiates Today (MME): Today's  total administered Morphine  Milligram Equivalents: 35 Opiates Yesterday (MME): Yesterday's total administered Morphine  Milligram Equivalents: 80  General: Sitting up in bed, no acute distress.  Pleasant and cooperative Respiratory: No increased work of breathing.  Operative Extremity (LUE): Dressing clean, dry, intact.  Incisional VAC with good seal and function.  No output in the canister.  Nontender over the shoulder.   LABS:  Results for orders placed or performed during the hospital encounter of 07/03/23 (from the past 24 hours)  Glucose, capillary     Status: Abnormal   Collection Time: 10/13/23 10:07 AM  Result Value Ref Range   Glucose-Capillary 123 (H) 70 - 99 mg/dL  Glucose, capillary     Status: Abnormal   Collection Time: 10/13/23 11:29 AM  Result Value Ref Range   Glucose-Capillary 155 (H) 70 - 99 mg/dL  Glucose, capillary     Status: Abnormal   Collection Time: 10/13/23  4:48 PM  Result Value Ref Range   Glucose-Capillary 184 (H) 70 - 99 mg/dL  Glucose, capillary     Status: Abnormal   Collection Time: 10/13/23  9:02 PM  Result Value Ref Range   Glucose-Capillary 155 (H) 70 - 99 mg/dL  CBC     Status: Abnormal   Collection Time: 10/14/23  4:52 AM  Result Value Ref Range   WBC 6.7 4.0 - 10.5 K/uL   RBC 2.37 (L) 4.22 - 5.81 MIL/uL   Hemoglobin 8.1 (L) 13.0 - 17.0 g/dL   HCT 74.6 (L) 60.9 - 47.9 %   MCV 106.8 (H) 80.0 - 100.0 fL   MCH 34.2 (H) 26.0 - 34.0 pg   MCHC 32.0 30.0 - 36.0 g/dL   RDW 81.5 (H) 88.4 - 84.4 %   Platelets  319 150 - 400 K/uL   nRBC 0.0 0.0 - 0.2 %  Renal function panel     Status: Abnormal   Collection Time: 10/14/23  4:52 AM  Result Value Ref Range   Sodium 131 (L) 135 - 145 mmol/L   Potassium 5.8 (H) 3.5 - 5.1 mmol/L   Chloride 101 98 - 111 mmol/L   CO2 21 (L) 22 - 32 mmol/L   Glucose, Bld 146 (H) 70 - 99 mg/dL   BUN 34 (H) 6 - 20 mg/dL   Creatinine, Ser 8.36 (H) 0.61 - 1.24 mg/dL   Calcium  8.6 (L) 8.9 - 10.3 mg/dL   Phosphorus 4.6 2.5 - 4.6 mg/dL   Albumin  2.5 (L) 3.5 - 5.0 g/dL   GFR, Estimated 48 (L) >60 mL/min   Anion gap 9 5 - 15  Magnesium      Status: None   Collection Time: 10/14/23  4:52 AM  Result Value Ref Range   Magnesium  2.0 1.7 - 2.4 mg/dL  Glucose, capillary     Status: Abnormal   Collection Time: 10/14/23  8:58 AM  Result Value Ref Range   Glucose-Capillary 172 (H) 70 - 99 mg/dL    ASSESSMENT: Frank [name].  is a 58 y.o. male, 1 Day Post-Op s/p LEFT TRANSHUMERAL AMPUTATION   CV/Blood loss: Acute blood loss anemia, Hgb 8.1 this AM.  2 units PRBCs has been ordered  PLAN: Weightbearing: NWB LUE ROM: Unrestricted motion as tolerated Incisional and dressing care: Reinforce dressings as needed  Showering: Okay for bed bath.  Hold off on showering for now Orthopedic device(s): Wound Vac:LUE  Pain management: Continue current multimodal regimen VTE treatment: Started on Eliquis  today.  Continue SCDs ID: Per infectious disease Foley/Lines:  No foley, KVO IVFs Dispo: PT/OT as able.  Will plan to maintain incisional wound VAC to the left upper extremity for 48 to 72 hours.  Continue care per primary team and oncology.   Follow - up plan: 2 weeks after d/c for wound check    Contact information:  Frank Light MD, Frank Moores PA-C. After hours and holidays please check [email or web address] for group call information for [facility]   Frank PATRIC Moores, PA-C [phone] (office) [email or web address]

## 2023-10-14 NOTE — Progress Notes (Signed)
 Nutrition Follow-up  DOCUMENTATION CODES:   Severe malnutrition in context of acute illness/injury  INTERVENTION:   Encourage PO intake-currently on regular diet Continue Ensure Plus High Protein po BID, each supplement provides 350 kcal and 20 grams of protein  Continue 30 ml Prosource Plus TID, each supplement provides 100 kcals and 15 grams protein Continue 1 packet Juven PO BID, each packet provides 95 calories, 2.5 grams of protein (collagen), to support wound healing  Continue MVI with minerals daily  NUTRITION DIAGNOSIS:   Severe Malnutrition related to acute illness (prolonged illness, interuptions in feeding) as evidenced by moderate fat depletion, moderate muscle depletion, percent weight loss (9.6% x 1 month). - Still applicable   GOAL:   Patient will meet greater than or equal to 90% of their needs - Meeting via PO intake   MONITOR:   PO intake, Supplement acceptance, Diet advancement  REASON FOR ASSESSMENT:   Consult Assessment of nutrition requirement/status  ASSESSMENT:   58 y.o. male with PMH of obesity, asthma, OSA on CPAP, GERD, HTN, HLD who presented due to feeling weak with AMS for a few days. Admitted with concern for sepsis, community-acquired pneumonia, encephalopathy as well as concern for multiple myeloma.  4/17 Admit to [facility] 4/21 SLP eval - NPO; IR attempted Cortrak but unable to be placed; CCM MD successfully placed NGT 4/22 Concern for aspiration overnight; Intubated 4/23 - CRRT 5/8 - tracheostomy 5/10 - CRRT discontinued 5/11 - transferred to [facility] 5/12 - iHD initiated  5/29 - PEG placed 5/31 - chemotherapy changed to Sarclisa /Kyprolis /Cytoxan  6/9 - MBS, NPO recommended  6/11 - trach removed 6/13 - BSE, NPO, tunneled HD catheter removed for line holiday 6/16 - Tunneled HD replaced,  6/18 - FEES, thin liquids when supervised 6/19, 6/21 - BSE, clear liquids 6/23 - MBS, clear liquids, pt not able to full participate in study  due to pain; no HD since 6/16, line pulled pt showing signs of renal recovery 6/24 - FEES, thin liquids and puree ok 6/27 - BSE - DYS 1, thins 7/10- per ortho notes, pt with rt hip/ thigh hematoma 7/11- s/p BSE- dysphagia 2 diet with thin liquids 7/14- s/p BSE- dysphagia 3 diet with thin liquids 7/16- advanced to regular diet per MD (oncology), PEG tube removed 7/28- L arm amputation through humerus     Hgb low this morning 7/29 s/p L arm amputation, pt will receive 2 units blood transfusion to treat. Pt followed by oncology with plans to restart chemotherapy next week, [name] following.    Pt resting in bed at time of follow up. Pt reports surgery went well yesterday and pain is well controlled. Pt reports no GI discomforts at this time. Pt reports good appetite and consuming 75-100% of meals over the last few days. Pt has been taking all ONS provided (2 Ensures daily, Juven BID, and Prosource). Pt was NPO earlier today due to possible [place] Cath placement, but is rescheduled for later this week. Pt eager to be discharged and wants to be home to walk his daughter down the aisle at her wedding in October. Pt has been working hard with PT/OT 5 days per week to make progress, praised pt for progress made and adherence to recommendations.    Admit weight: 110 kg Current weight: 107 kg  Generalized edema (mild pitting BLE, non pitting RUE)  Average Meal Intake: 7/14: 50-75% x 2 recorded meal intakes  7/15: 75% x 1 recorded meal intakes  7/19: 100% x 1  recorded meal intakes  7/22: 50% x 1 recorded meal intakes  7/24: 100% of 1 x recorded meal intakes *per pt report, eating between 75-100% last few days*  Nutritionally Relevant Medications: ProSource Plus 30mL TID Colace Epoetin  alfa 40,000 units weekly Lasix  SSI Novolog  4x/day Novolog  2 units 4x/day Semglee  10 units daily MVI w/ minerals daily Juven BID Protonix  Miralax   Labs Reviewed: Sodium 131 Potassium 5.8 BUN  34/Creatinine 1.63 Hgb 8.1 (will receive transfusion) CBG ranges from 102-184 mg/dL over the last 24 hours HgbA1c 6.0  Diet Order:   Diet Order             Diet NPO time specified  Diet effective now                   EDUCATION NEEDS:   Not appropriate for education at this time  Skin:  Skin Assessment: Skin Integrity Issues: Skin Integrity Issues:: Stage III, Stage II DTI: Left Buttocks Stage II: lt ischium Stage III: sacrum, rt ischium Unstageable: Left and Right Heel  Last BM:  7/26  Height:   Ht Readings from Last 1 Encounters:  10/01/23 6' (1.829 m)   Weight:   Wt Readings from Last 1 Encounters:  10/14/23 107 kg    Ideal Body Weight:  80.9 kg  BMI:  Body mass index is 31.99 kg/m.  Estimated Nutritional Needs:   Kcal:  2400-2700 kcal/d  Protein:  130-150g/d  Fluid:  2.5L/d   Josette Glance, MS, RDN, LDN Clinical Dietitian I Please reach out via secure chat

## 2023-10-14 NOTE — Consult Note (Signed)
 WOC Nurse wound follow up Wound type:  Stage 3 Pressure injury to Sacrum Stage 3 pressure injury to L ishium Measurement: 3 cm x 2 cm x 0.3 cm  Wound bed:red, moist Drainage (amount, consistency, odor) serosanguinous- moderate amount Periwound: macerated Dressing procedure/placement/frequency:  Cleanse Sacrum with NS, pat dry.  Place a piece of Silver hydrofiber (lawson # [id number]) into wound bed and cover with Silicone foam dressing.  Change daily.  L ischium: Cleanse wound with NS, pat dry.  Cover with silicone foam dressing, lift each shift to visualize wound and change foam dressing every 3 days.   WOC Nurse team will follow with you and see patient within 10 days for wound assessments.  Please notify WOC nurses of any acute changes in the wounds or any new areas of concern  Doyal Polite, RN, MSN, [facility] WOC Team

## 2023-10-14 NOTE — Progress Notes (Signed)
 PT Cancellation Note  Patient Details Name: Frank [name]. MRN: [id number] DOB: 24-Mar-1965   Cancelled Treatment:    Reason Eval/Treat Not Completed: Pain limiting ability to participate. Pt reports 7.5/10 pain after pre-medication, and states he has not eaten lunch yet. Encouragement provided to participate but pt declined any activity with PT at this time. Requests that PT return tomorrow for re-evaluation. Will continue to follow.    Leita JONETTA Sable 10/14/2023, 1:49 PM  Leita Sable, PT, DPT Acute Rehabilitation Services Secure Chat Preferred Office: [phone]

## 2023-10-14 NOTE — Progress Notes (Signed)
 2nd unit of blood started at 2135.  15 min check 2150

## 2023-10-15 ENCOUNTER — Inpatient Hospital Stay (HOSPITAL_COMMUNITY)

## 2023-10-15 DIAGNOSIS — A419 Sepsis, unspecified organism: Secondary | ICD-10-CM | POA: Diagnosis not present

## 2023-10-15 DIAGNOSIS — R6521 Severe sepsis with septic shock: Secondary | ICD-10-CM | POA: Diagnosis not present

## 2023-10-15 DIAGNOSIS — I5032 Chronic diastolic (congestive) heart failure: Secondary | ICD-10-CM

## 2023-10-15 DIAGNOSIS — I4891 Unspecified atrial fibrillation: Secondary | ICD-10-CM | POA: Diagnosis not present

## 2023-10-15 DIAGNOSIS — R531 Weakness: Secondary | ICD-10-CM | POA: Diagnosis not present

## 2023-10-15 DIAGNOSIS — N1832 Chronic kidney disease, stage 3b: Secondary | ICD-10-CM

## 2023-10-15 HISTORY — PX: IR IMAGING GUIDED PORT INSERTION: IMG5740

## 2023-10-15 LAB — TYPE AND SCREEN
ABO/RH(D): A POS
Antibody Screen: POSITIVE
DAT, IgG: NEGATIVE
Unit division: 0
Unit division: 0

## 2023-10-15 LAB — RENAL FUNCTION PANEL
Albumin: 2.6 g/dL — ABNORMAL LOW (ref 3.5–5.0)
Anion gap: 10 (ref 5–15)
BUN: 40 mg/dL — ABNORMAL HIGH (ref 6–20)
CO2: 21 mmol/L — ABNORMAL LOW (ref 22–32)
Calcium: 8.8 mg/dL — ABNORMAL LOW (ref 8.9–10.3)
Chloride: 98 mmol/L (ref 98–111)
Creatinine, Ser: 1.49 mg/dL — ABNORMAL HIGH (ref 0.61–1.24)
GFR, Estimated: 54 mL/min — ABNORMAL LOW (ref >60)
Glucose, Bld: 112 mg/dL — ABNORMAL HIGH (ref 70–99)
Phosphorus: 6.2 mg/dL — ABNORMAL HIGH (ref 2.5–4.6)
Potassium: 5.3 mmol/L — ABNORMAL HIGH (ref 3.5–5.1)
Sodium: 129 mmol/L — ABNORMAL LOW (ref 135–145)

## 2023-10-15 LAB — BPAM RBC
Blood Product Expiration Date: 202508242359
Blood Product Expiration Date: 202508242359
ISSUE DATE / TIME: 202507291543
ISSUE DATE / TIME: 202507292120
Unit Type and Rh: 6200
Unit Type and Rh: 6200

## 2023-10-15 LAB — CBC
HCT: 30.6 % — ABNORMAL LOW (ref 39.0–52.0)
Hemoglobin: 10 g/dL — ABNORMAL LOW (ref 13.0–17.0)
MCH: 32.1 pg (ref 26.0–34.0)
MCHC: 32.7 g/dL (ref 30.0–36.0)
MCV: 98.1 fL (ref 80.0–100.0)
Platelets: 297 K/uL (ref 150–400)
RBC: 3.12 MIL/uL — ABNORMAL LOW (ref 4.22–5.81)
RDW: 21.7 % — ABNORMAL HIGH (ref 11.5–15.5)
WBC: 8.5 K/uL (ref 4.0–10.5)
nRBC: 0 % (ref 0.0–0.2)

## 2023-10-15 LAB — GLUCOSE, CAPILLARY
Glucose-Capillary: 102 mg/dL — ABNORMAL HIGH (ref 70–99)
Glucose-Capillary: 118 mg/dL — ABNORMAL HIGH (ref 70–99)
Glucose-Capillary: 183 mg/dL — ABNORMAL HIGH (ref 70–99)
Glucose-Capillary: 133 mg/dL — ABNORMAL HIGH (ref 70–99)

## 2023-10-15 LAB — MAGNESIUM: Magnesium: 1.9 mg/dL (ref 1.7–2.4)

## 2023-10-15 MED ORDER — HEPARIN SOD (PORK) LOCK FLUSH 100 UNIT/ML IV SOLN
INTRAVENOUS | Status: AC
  Filled 2023-10-15: qty 5

## 2023-10-15 MED ORDER — SODIUM ZIRCONIUM CYCLOSILICATE 10 G PO PACK
10.0000 g | PACK | Freq: Once | ORAL | Status: AC
  Administered 2023-10-15: 10 g via ORAL
  Filled 2023-10-15: qty 1

## 2023-10-15 MED ORDER — MIDAZOLAM HCL 2 MG/2ML IJ SOLN
INTRAMUSCULAR | Status: AC
  Filled 2023-10-15: qty 2

## 2023-10-15 MED ORDER — HEPARIN SOD (PORK) LOCK FLUSH 100 UNIT/ML IV SOLN
500.0000 [IU] | Freq: Once | INTRAVENOUS | Status: AC
  Administered 2023-10-15: 500 [IU] via INTRAVENOUS
  Filled 2023-10-15: qty 5

## 2023-10-15 MED ORDER — FENTANYL CITRATE (PF) 100 MCG/2ML IJ SOLN
INTRAMUSCULAR | Status: AC
Start: 2023-10-15 — End: 2023-10-15
  Filled 2023-10-15: qty 2

## 2023-10-15 MED ORDER — LIDOCAINE-EPINEPHRINE 1 %-1:100000 IJ SOLN
20.0000 mL | Freq: Once | INTRAMUSCULAR | Status: AC
  Administered 2023-10-15: 20 mL via INTRADERMAL
  Filled 2023-10-15: qty 20

## 2023-10-15 MED ORDER — HEPARIN SOD (PORK) LOCK FLUSH 100 UNIT/ML IV SOLN: 500.0000 [IU] | Freq: Once | INTRAVENOUS | Status: DC

## 2023-10-15 MED ORDER — MIDAZOLAM HCL 2 MG/2ML IJ SOLN
INTRAMUSCULAR | Status: AC | PRN
Start: 2023-10-15 — End: 2023-10-15
  Administered 2023-10-15: .5 mg via INTRAVENOUS
  Administered 2023-10-15: 1 mg via INTRAVENOUS

## 2023-10-15 MED ORDER — LIDOCAINE-EPINEPHRINE 1 %-1:100000 IJ SOLN
INTRAMUSCULAR | Status: AC
  Filled 2023-10-15: qty 1

## 2023-10-15 MED ORDER — FENTANYL CITRATE (PF) 100 MCG/2ML IJ SOLN
INTRAMUSCULAR | Status: AC | PRN
  Administered 2023-10-15: 50 ug via INTRAVENOUS
  Administered 2023-10-15: 25 ug via INTRAVENOUS

## 2023-10-15 NOTE — Plan of Care (Signed)
 Problem: Education: Goal: Knowledge of General Education information will improve Description: Including pain rating scale, medication(s)/side effects and non-pharmacologic comfort measures Outcome: Progressing   Problem: Health Behavior/Discharge Planning: Goal: Ability to manage health-related needs will improve Outcome: Progressing   Problem: Clinical Measurements: Goal: Ability to maintain clinical measurements within normal limits will improve Outcome: Progressing Goal: Will remain free from infection Outcome: Progressing Goal: Diagnostic test results will improve Outcome: Progressing Goal: Respiratory complications will improve Outcome: Progressing Goal: Cardiovascular complication will be avoided Outcome: Progressing   Problem: Activity: Goal: Risk for activity intolerance will decrease Outcome: Progressing   Problem: Nutrition: Goal: Adequate nutrition will be maintained Outcome: Progressing   Problem: Coping: Goal: Level of anxiety will decrease Outcome: Progressing   Problem: Elimination: Goal: Will not experience complications related to bowel motility Outcome: Progressing Goal: Will not experience complications related to urinary retention Outcome: Progressing   Problem: Pain Managment: Goal: General experience of comfort will improve and/or be controlled Outcome: Progressing   Problem: Safety: Goal: Ability to remain free from injury will improve Outcome: Progressing   Problem: Skin Integrity: Goal: Risk for impaired skin integrity will decrease Outcome: Progressing   Problem: Education: Goal: Ability to describe self-care measures that may prevent or decrease complications (Diabetes Survival Skills Education) will improve Outcome: Progressing Goal: Individualized Educational Video(s) Outcome: Progressing   Problem: Coping: Goal: Ability to adjust to condition or change in health will improve Outcome: Progressing   Problem: Fluid  Volume: Goal: Ability to maintain a balanced intake and output will improve Outcome: Progressing   Problem: Health Behavior/Discharge Planning: Goal: Ability to identify and utilize available resources and services will improve Outcome: Progressing Goal: Ability to manage health-related needs will improve Outcome: Progressing   Problem: Metabolic: Goal: Ability to maintain appropriate glucose levels will improve Outcome: Progressing   Problem: Nutritional: Goal: Maintenance of adequate nutrition will improve Outcome: Progressing Goal: Progress toward achieving an optimal weight will improve Outcome: Progressing   Problem: Skin Integrity: Goal: Risk for impaired skin integrity will decrease Outcome: Progressing   Problem: Tissue Perfusion: Goal: Adequacy of tissue perfusion will improve Outcome: Progressing   Problem: Education: Goal: Knowledge about tracheostomy care/management will improve Outcome: Progressing   Problem: Activity: Goal: Ability to tolerate increased activity will improve Outcome: Progressing   Problem: Health Behavior/Discharge Planning: Goal: Ability to manage tracheostomy will improve Outcome: Progressing   Problem: Education: Goal: Knowledge of General Education information will improve Description: Including pain rating scale, medication(s)/side effects and non-pharmacologic comfort measures Outcome: Progressing   Problem: Health Behavior/Discharge Planning: Goal: Ability to manage health-related needs will improve Outcome: Progressing   Problem: Clinical Measurements: Goal: Ability to maintain clinical measurements within normal limits will improve Outcome: Progressing Goal: Will remain free from infection Outcome: Progressing Goal: Diagnostic test results will improve Outcome: Progressing Goal: Respiratory complications will improve Outcome: Progressing Goal: Cardiovascular complication will be avoided Outcome: Progressing    Problem: Activity: Goal: Risk for activity intolerance will decrease Outcome: Progressing   Problem: Nutrition: Goal: Adequate nutrition will be maintained Outcome: Progressing   Problem: Coping: Goal: Level of anxiety will decrease Outcome: Progressing   Problem: Elimination: Goal: Will not experience complications related to bowel motility Outcome: Progressing Goal: Will not experience complications related to urinary retention Outcome: Progressing   Problem: Pain Managment: Goal: General experience of comfort will improve and/or be controlled Outcome: Progressing   Problem: Safety: Goal: Ability to remain free from injury will improve Outcome: Progressing   Problem: Skin Integrity: Goal: Risk for impaired  skin integrity will decrease Outcome: Progressing

## 2023-10-15 NOTE — Progress Notes (Signed)
 PT Cancellation Note  Patient Details Name: Frank [name]. MRN: [id number] DOB: May 11, 1965   Cancelled Treatment:    Reason Eval/Treat Not Completed: (P) Patient at procedure or test/unavailable Pt off the floor for [facility] Cath placement. PT will follow back later as able.   Yachet Mattson B. Fleeta Lapidus PT, DPT Acute Rehabilitation Services Please use secure chat or  Call Office [phone]    [name] 10/15/2023, 1:41 PM

## 2023-10-15 NOTE — Progress Notes (Signed)
 Physical Therapy Re- Evaluation and Treatment Patient Details Name: Frank [name]. MRN: [id number] DOB: 1966/03/18 Today's Date: 10/15/2023  History of Present Illness  59 y.o. male transferred from [facility] 07/03/23 to [facility] for management of newly diagnosed plasma cell myeloma with weakness and AMS. Pt also with AKI and metabolic encephalopathy. 5/10 transfer to [facility] for iHD. 4/22 Intubated and chemo initiated. 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 5/6 IVIG started. 5/8-6/11 trach. 5/12 iHD initiated. 5/29 PEG placed. 6/5 chemo initiated. 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 Pt continues to be NWB through R UE. Pt limited in safe mobility by change in CoG as a result of surgery, in the presence of his already weak LE and R UE. S/p surgery R shoulder and neck deficits more readily noticeable. OT working on ROM exercises in presence of shoulder depression and neck retraction as pt compensating for R UE non-use for some time. Pt is maxAx2 for sitting EoB and standing from EoB and totalAx2 for return to supine. Pt continues to need to be able to transfer to wheelchair at a min A level and tolerate 3 hours of sitting for chemotherapy.  PT will continue to work towards that goal. Pt would benefit from from continued inpatient follow up therapy, <3 hours/day.        If plan is discharge home, recommend the following: Two people to help with walking and/or transfers;Assistance with cooking/housework;Assist for transportation;Two people to help with bathing/dressing/bathroom;Direct supervision/assist for medications management;Direct supervision/assist for financial management;Help with stairs or ramp for entrance;Assistance with feeding   Can travel by private vehicle   No    Equipment Recommendations Hospital bed;Hoyer lift;Wheelchair (measurements  PT);Wheelchair cushion (measurements PT);BSC/3in1     Functional Status Assessment Patient has had a recent decline in their functional status and/or demonstrates limited ability to make significant improvements in function in a reasonable and predictable amount of time     Precautions / Restrictions Precautions Precautions: Fall Recall of Precautions/Restrictions: Impaired Restrictions Weight Bearing Restrictions Per Provider Order: Yes LUE Weight Bearing Per Provider Order: Non weight bearing Other Position/Activity Restrictions: ROM to tolerance      Mobility  Bed Mobility Overal bed mobility: Needs Assistance Bed Mobility: Supine to Sit, Sit to Supine Rolling: Max assist, Used rails Sidelying to sit: Max assist, +2 for physical assistance, +2 for safety/equipment Supine to sit: Contact guard, Used rails, HOB elevated Sit to supine: +2 for physical assistance, Total assist Sit to sidelying: Max assist, +2 for physical assistance General bed mobility comments: Pt able to come to EOB on the L side with increased time and cues to use bed rail. Total A +2 return to supine to protect LUE and maintain body control while legs were sweeped into bed.    Transfers Overall transfer level: Needs assistance Equipment used: 1 person hand held assist Transfers: Sit to/from Stand Sit to Stand: +2 physical assistance, Max assist, +2 safety/equipment          Lateral/Scoot Transfers: +2 physical assistance, Min assist General transfer comment: minAx2 for 1x sit<>stand and modAx2 for sit<>stand and then modAx1 for lifting Hips to place clean linens Transfer via Lift Equipment: Stedy  Ambulation/Gait               General Gait Details: Deferred at this time        Balance Overall  balance assessment: Needs assistance Sitting-balance support: Feet supported, No upper extremity supported Sitting balance-Leahy Scale: Fair Sitting balance - Comments: EOB Postural control: Right  lateral lean Standing balance support: Single extremity supported, During functional activity, Reliant on assistive device for balance Standing balance-Leahy Scale: Zero Standing balance comment: uses back of recliner to maintain standing                             Pertinent Vitals/Pain Pain Assessment Pain Assessment: Faces Faces Pain Scale: Hurts little more Breathing: normal Negative Vocalization: none Facial Expression: smiling or inexpressive Body Language: relaxed Consolability: no need to console PAINAD Score: 0 Pain Location: back Pain Descriptors / Indicators: Discomfort, Aching, Guarding Pain Intervention(s): Limited activity within patient's tolerance, Monitored during session, Repositioned    Home Living Family/patient expects to be discharged to:: Private residence Living Arrangements: Spouse/significant other Available Help at Discharge: Family;Available PRN/intermittently Type of Home: Mobile home Home Access: Ramped entrance       Home Layout: One level Home Equipment: Agricultural consultant (2 wheels);Cane - single point;Wheelchair - Engineer, technical sales - power;Electric scooter;Shower seat;Grab bars - toilet;Grab bars - tub/shower;Hand held shower head;BSC/3in1 Additional Comments: Information from prior encounter    Prior Function Prior Level of Function : Patient poor historian/Family not available             Mobility Comments: Mostly ind with no AD however uses SPC occasionally. ADLs Comments: Ind ADLs, light IADLs     Extremity/Trunk Assessment   Upper Extremity Assessment Upper Extremity Assessment: Defer to OT evaluation RUE Deficits / Details: Generalized weakness, pt using functionally for mobility RUE Sensation: WNL LUE Deficits / Details: s/p LUE ambutation at the distal humerus. Pain well controlled, able to raise to 80 deg of shoulder flexion, attempted gentle PROM and pain at roughly 95 deg. Noted to have slight scaupular winging ~15  to 20 deg. does c/o phantom limb pain LUE Sensation: WNL LUE Coordination: decreased fine motor;decreased gross motor    Lower Extremity Assessment Lower Extremity Assessment: Generalized weakness;RLE deficits/detail;LLE deficits/detail LLE Deficits / Details: J-P drain in L thigh draining hematoma, still warm to touch since drain placed but size of thigh is greatly reduced    Cervical / Trunk Assessment Cervical / Trunk Assessment: Kyphotic  Communication   Communication Communication: Impaired Factors Affecting Communication: Reduced clarity of speech    Cognition Arousal: Alert Behavior During Therapy: WFL for tasks assessed/performed   PT - Cognitive impairments: Sequencing, Problem solving, Safety/Judgement Difficult to assess due to: Impaired communication                     PT - Cognition Comments: cues for sequencing and step by step instructions for mobility Following commands: Impaired Following commands impaired: Follows one step commands with increased time     Cueing Cueing Techniques: Verbal cues, Tactile cues, Visual cues     General Comments General comments (skin integrity, edema, etc.): VSS on RA +    Exercises Other Exercises Other Exercises: Arm swings x10 LLE Other Exercises: Arm Circles x15 LUE Other Exercises: AAROM shoulder flexion, AROM horizontal adduction/abduction LUE x8 reps each   Assessment/Plan    PT Assessment Patient needs continued PT services  PT Problem List Decreased strength;Decreased range of motion;Decreased activity tolerance;Decreased balance;Decreased mobility;Decreased cognition       PT Treatment Interventions DME instruction;Functional mobility training;Therapeutic activities;Therapeutic exercise;Balance training;Patient/family education;Cognitive remediation;Gait training    PT Goals (Current goals can be  found in the Care Plan section)  Acute Rehab PT Goals Patient Stated Goal: to get out of bed PT Goal  Formulation: With patient Time For Goal Achievement: 10/23/23 Potential to Achieve Goals: Fair    Frequency Min 5X/week     Co-evaluation PT/OT/SLP Co-Evaluation/Treatment: Yes Reason for Co-Treatment: For patient/therapist safety;To address functional/ADL transfers PT goals addressed during session: Mobility/safety with mobility;Balance OT goals addressed during session: ADL's and self-care;Strengthening/ROM       AM-PAC PT 6 Clicks Mobility  Outcome Measure Help needed turning from your back to your side while in a flat bed without using bedrails?: A Lot Help needed moving from lying on your back to sitting on the side of a flat bed without using bedrails?: Total Help needed moving to and from a bed to a chair (including a wheelchair)?: Total Help needed standing up from a chair using your arms (e.g., wheelchair or bedside chair)?: Total Help needed to walk in hospital room?: Total Help needed climbing 3-5 steps with a railing? : Total 6 Click Score: 7    End of Session Equipment Utilized During Treatment: Gait belt Activity Tolerance: Patient tolerated treatment well;Patient limited by fatigue Patient left: in bed;with call bell/phone within reach Nurse Communication: Mobility status PT Visit Diagnosis: Other abnormalities of gait and mobility (R26.89);Muscle weakness (generalized) (M62.81);Difficulty in walking, not elsewhere classified (R26.2)    Time: 8392-8352 PT Time Calculation (min) (ACUTE ONLY): 40 min   Charges:   PT Evaluation $PT Re-evaluation: 1 Re-eval   PT General Charges $$ ACUTE PT VISIT: 1 Visit         Mykel Sponaugle B. Fleeta Lapidus PT, DPT Acute Rehabilitation Services Please use secure chat or  Call Office [phone]   [name] [facility] 10/15/2023, 5:35 PM

## 2023-10-15 NOTE — Progress Notes (Signed)
 Orthopaedic Trauma Progress Note  SUBJECTIVE: Resting comfortably in bed. No acute events noted overnight. Scheduled for placement of Port-A-cath today  OBJECTIVE:  Vitals:   10/15/23 0749 10/15/23 1113  BP: (!) 127/97   Pulse: (!) 110   Resp: 18   Temp: 98 F (36.7 C)   SpO2: 95% 95%    Opiates Today (MME): Today's  total administered Morphine  Milligram Equivalents: 35 Opiates Yesterday (MME): Yesterday's total administered Morphine  Milligram Equivalents: 120  General: Comfortable in bed, no acute distress.   Respiratory: No increased work of breathing.  Operative Extremity (LUE): Dressing clean, dry, intact.  Incisional VAC with good seal and function.  No output in the canister.  Nontender over the shoulder.   LABS:  Results for orders placed or performed during the hospital encounter of 07/03/23 (from the past 24 hours)  Glucose, capillary     Status: Abnormal   Collection Time: 10/14/23  6:08 PM  Result Value Ref Range   Glucose-Capillary 139 (H) 70 - 99 mg/dL  Glucose, capillary     Status: Abnormal   Collection Time: 10/14/23  8:22 PM  Result Value Ref Range   Glucose-Capillary 124 (H) 70 - 99 mg/dL  CBC     Status: Abnormal   Collection Time: 10/15/23  5:18 AM  Result Value Ref Range   WBC 8.5 4.0 - 10.5 K/uL   RBC 3.12 (L) 4.22 - 5.81 MIL/uL   Hemoglobin 10.0 (L) 13.0 - 17.0 g/dL   HCT 69.3 (L) 60.9 - 47.9 %   MCV 98.1 80.0 - 100.0 fL   MCH 32.1 26.0 - 34.0 pg   MCHC 32.7 30.0 - 36.0 g/dL   RDW 78.2 (H) 88.4 - 84.4 %   Platelets 297 150 - 400 K/uL   nRBC 0.0 0.0 - 0.2 %  Renal function panel     Status: Abnormal   Collection Time: 10/15/23  5:18 AM  Result Value Ref Range   Sodium 129 (L) 135 - 145 mmol/L   Potassium 5.3 (H) 3.5 - 5.1 mmol/L   Chloride 98 98 - 111 mmol/L   CO2 21 (L) 22 - 32 mmol/L   Glucose, Bld 112 (H) 70 - 99 mg/dL   BUN 40 (H) 6 - 20 mg/dL   Creatinine, Ser 8.50 (H) 0.61 - 1.24 mg/dL   Calcium  8.8 (L) 8.9 - 10.3 mg/dL   Phosphorus  6.2 (H) 2.5 - 4.6 mg/dL   Albumin  2.6 (L) 3.5 - 5.0 g/dL   GFR, Estimated 54 (L) >60 mL/min   Anion gap 10 5 - 15  Magnesium      Status: None   Collection Time: 10/15/23  5:18 AM  Result Value Ref Range   Magnesium  1.9 1.7 - 2.4 mg/dL  Glucose, capillary     Status: Abnormal   Collection Time: 10/15/23  7:50 AM  Result Value Ref Range   Glucose-Capillary 102 (H) 70 - 99 mg/dL    ASSESSMENT: Frank [name]. is a 58 y.o. male, 2 Days Post-Op s/p LEFT TRANSHUMERAL AMPUTATION   CV/Blood loss: Acute blood loss anemia, Hgb 10.0 this AM.  Received 2 units PRBCs 10/14/23  PLAN: Weightbearing: NWB LUE ROM: Unrestricted motion as tolerated Incisional and dressing care: Reinforce dressings as needed  Showering: Okay for bed bath.  Hold off on showering for now Orthopedic device(s): Wound Vac:LUE  Pain management: Continue current multimodal regimen VTE treatment: Eliquis .  Continue SCDs ID: Per infectious disease Foley/Lines:  No foley, KVO IVFs Dispo: PT/OT  as able.  Will plan to maintain incisional wound VAC to the left upper extremity for 72 hours post-op.  Continue care per primary team and oncology.   Follow - up plan: 2 weeks after d/c for wound check    Contact information:  [name], [name] PA-C. After hours and holidays please check [email or web address] for group call information for [facility]   [name], PA-C [phone] (office) [email or web address]

## 2023-10-15 NOTE — Progress Notes (Signed)
 This chaplain is present for F/U spiritual care. The Pt. is awake and waiting for a procedure. The Pt. expressed his frustration in pre-op NPO status since midnight.  Otherwise the Pt. continues to be hopeful for healing and coping with his diagnoses through his faith.    The Pt. communicated his plan to participate in therapy Wed., Thurs., and Fri. because therapy was missed earlier in the week.  Through reflective listening, the chaplain learned the Pt. feels connected to family through their frequent phone calls and blessed by his wife spending the night before the amputation.  The chaplain shared scripture about strength with the Pt. per the Pt. request.  Frank [name] [phone]

## 2023-10-15 NOTE — Progress Notes (Signed)
 Frank [name] is doing okay.  He still have a little bit of discomfort with the left arm after his disarticulation on Monday.  Hopefully, this will continue to improve.  Hopefully he will do his physical therapy and Occupational Therapy today.  He did get 2 units of blood yesterday.  I thought this would really help him out.  He is going have his Port-A-Cath placed today.  The drainage catheter is still in his right hip.  I am not sure how long this is going to stay in the right hip.  His labs show a sodium 129.  Potassium 5.3.  BUN 40 creatinine 1.49.  Calcium  8.8 with an albumin  of 2.6.  His white cell count is 8.5.  Hemoglobin 10.0.  Platelet count 297,000.  His appetite is good.  He has had no nausea or vomiting.  He is eating quite well.  There is no cough.  He has had no bleeding.  I really think we have to try to get treatment started again on him next week.  At some point, he is going to have to be moved to I would think Rehab or Skilled nursing.  His faith is incredibly strong.   Jeralyn Crease, MD  Inge 29:17

## 2023-10-15 NOTE — Progress Notes (Signed)
 OT Cancellation Note  Patient Details Name: Frank [name]. MRN: [id number] DOB: [birth year]   Cancelled Treatment:    Reason Eval/Treat Not Completed: Patient at procedure or test/ unavailable (Pt continues to be off floor for porta cath, OT to follow-up with pt as able)  10/15/2023  AB, OTR/L  Acute Rehabilitation Services  Office: [phone]  Curtistine JONETTA Das 10/15/2023, 1:56 PM

## 2023-10-15 NOTE — Evaluation (Signed)
 Occupational Therapy RE-Evaluation Patient Details Name: Frank [name]. MRN: [id number] DOB: [birth year] Today's Date: 10/15/2023   History of Present Illness   58 y.o. male transferred from [facility] 07/03/23 to [facility] for management of newly diagnosed plasma cell myeloma with weakness and AMS. Pt also with AKI and metabolic encephalopathy. 5/10 transfer to [facility] for iHD. 4/22 Intubated and chemo initiated. 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 5/6 IVIG started. 5/8-6/11 trach. 5/12 iHD initiated. 5/29 PEG placed. 6/5 chemo initiated. PMHx:Lt THA, carpal tunnel syndrome, Lt TKA, Lt humerus fx, PAF, HTN, HLD, bipolar disorder, obesity, T2DM     Clinical Impressions Pt re-evaluated today s/p LUE amputation, he demonstrates good pain control and ROM with LUE although it is limited to less than 100 deg AROM shoulder movement. Pt noted to have some L scapular winging and compensates shoulder AROM by hiking traps and exaggerating neck movements, worked with pt on LUE exercises in appropriate postural control. Also educated him on phantom sensation/pain, using tactile and sensory input to help soothe stimuli. He continues to need Max A to setup A for ADLs and was able to stand with maxA+2.  OT to continue to progress pt as able.      If plan is discharge home, recommend the following:   Two people to help with walking and/or transfers;Assistance with cooking/housework;Assistance with feeding;Direct supervision/assist for medications management;Direct supervision/assist for financial management;Assist for transportation;Help with stairs or ramp for entrance;A lot of help with bathing/dressing/bathroom     Functional Status Assessment   Patient has had a recent decline in their functional status and demonstrates the ability to make significant improvements in function in a reasonable and predictable amount of time.     Equipment Recommendations   Other (comment) (TBD)     Recommendations for  Other Services         Precautions/Restrictions   Precautions Precautions: Fall Recall of Precautions/Restrictions: Impaired Restrictions Weight Bearing Restrictions Per Provider Order: Yes LUE Weight Bearing Per Provider Order: Non weight bearing Other Position/Activity Restrictions: ROM to tolerance     Mobility Bed Mobility Overal bed mobility: Needs Assistance Bed Mobility: Supine to Sit, Sit to Supine     Supine to sit: Contact guard, Used rails, HOB elevated Sit to supine: +2 for physical assistance, Total assist   General bed mobility comments: Pt able to come to EOB on the L side with increased time and cues to use bed rail. Total A +2 return to supine to protect LUE and maintain body control while legs were sweeped into bed.    Transfers Overall transfer level: Needs assistance Equipment used: 1 person hand held assist Transfers: Sit to/from Stand Sit to Stand: +2 physical assistance, Max assist, +2 safety/equipment                  Balance Overall balance assessment: Needs assistance Sitting-balance support: Feet supported, No upper extremity supported Sitting balance-Leahy Scale: Fair Sitting balance - Comments: EOB                                   ADL either performed or assessed with clinical judgement   ADL Overall ADL's : Needs assistance/impaired Eating/Feeding: Set up;Bed level   Grooming: Set up;Bed level Grooming Details (indicate cue type and reason): using RUE Upper Body Bathing: Moderate assistance;Sitting   Lower Body Bathing: Sitting/lateral leans;Maximal assistance   Upper Body Dressing : Sitting;Moderate assistance Upper Body  Dressing Details (indicate cue type and reason): don gown using RUE.                 Functional mobility during ADLs: Maximal assistance;+2 for physical assistance;+2 for safety/equipment (standing) General ADL Comments: Edcuated pt on the use of tactile and visual stimuli to address  phantom pain/sensation.     Vision         Perception         Praxis         Pertinent Vitals/Pain Pain Assessment Pain Assessment: Faces Faces Pain Scale: Hurts little more Pain Location: back Pain Descriptors / Indicators: Discomfort, Aching, Guarding Pain Intervention(s): Monitored during session, Limited activity within patient's tolerance, Repositioned     Extremity/Trunk Assessment Upper Extremity Assessment RUE Deficits / Details: Generalized weakness, pt using functionally for mobility LUE Deficits / Details: s/p LUE ambutation at the distal humerus. Pain well controlled, able to raise to 80 deg of shoulder flexion, attempted gentle PROM and pain at roughly 95 deg. Noted to have slight scaupular winging ~15 to 20 deg. does c/o phantom limb pain LUE Sensation: WNL           Communication Communication Communication: Impaired Factors Affecting Communication: Reduced clarity of speech   Cognition Arousal: Alert Behavior During Therapy: WFL for tasks assessed/performed Cognition: Cognition impaired     Awareness: Intellectual awareness intact, Online awareness impaired                         Following commands: Impaired Following commands impaired: Follows one step commands with increased time     Cueing  General Comments   Cueing Techniques: Verbal cues;Tactile cues;Visual cues      Exercises Other Exercises Other Exercises: Arm swings x10 LLE Other Exercises: Arm Circles x15 LUE Other Exercises: AAROM shoulder flexion, AROM horizontal adduction/abduction LUE x8 reps each   Shoulder Instructions      Home Living Family/patient expects to be discharged to:: Private residence Living Arrangements: Spouse/significant other   Type of Home: Mobile home Home Access: Ramped entrance     Home Layout: One level     Bathroom Shower/Tub: Walk-in shower;Tub only   Bathroom Toilet: Handicapped height Bathroom Accessibility: Yes   Home  Equipment: Agricultural consultant (2 wheels);Cane - single point;Wheelchair - Engineer, technical sales - power;Electric scooter;Shower seat;Grab bars - toilet;Grab bars - tub/shower;Hand held shower head;BSC/3in1          Prior Functioning/Environment               Mobility Comments: Mostly ind with no AD however uses SPC occasionally. ADLs Comments: Ind ADLs, light IADLs    OT Problem List: Decreased strength;Decreased range of motion;Decreased activity tolerance;Impaired balance (sitting and/or standing);Decreased cognition;Decreased safety awareness;Decreased knowledge of use of DME or AE;Decreased knowledge of precautions;Pain   OT Treatment/Interventions: Self-care/ADL training;Therapeutic exercise;DME and/or AE instruction;Therapeutic activities;Balance training;Patient/family education      OT Goals(Current goals can be found in the care plan section)   Acute Rehab OT Goals Patient Stated Goal: To be able to transfer OT Goal Formulation: With patient Time For Goal Achievement: 10/29/23 Potential to Achieve Goals: Fair ADL Goals Additional ADL Goal #3: (P) Pt will demonstrate independence with   OT Frequency:  Min 1X/week (STAR Program)    Co-evaluation PT/OT/SLP Co-Evaluation/Treatment: Yes Reason for Co-Treatment: For patient/therapist safety;To address functional/ADL transfers PT goals addressed during session: Mobility/safety with mobility;Balance OT goals addressed during session: ADL's and self-care;Strengthening/ROM      AM-PAC  OT 6 Clicks Daily Activity     Outcome Measure Help from another person eating meals?: A Little Help from another person taking care of personal grooming?: A Little Help from another person toileting, which includes using toliet, bedpan, or urinal?: Total Help from another person bathing (including washing, rinsing, drying)?: A Lot Help from another person to put on and taking off regular upper body clothing?: A Lot Help from another person  to put on and taking off regular lower body clothing?: A Lot 6 Click Score: 13   End of Session Equipment Utilized During Treatment: Gait belt Nurse Communication: Mobility status  Activity Tolerance: Patient tolerated treatment well Patient left: in bed;with call bell/phone within reach  OT Visit Diagnosis: Unsteadiness on feet (R26.81);Other abnormalities of gait and mobility (R26.89);Muscle weakness (generalized) (M62.81);Pain Pain - Right/Left: Left Pain - part of body: Shoulder                Time: 8392-8351 OT Time Calculation (min): 41 min Charges:  OT General Charges $OT Visit: 1 Visit OT Treatments $Therapeutic Activity: 23-37 mins  10/15/2023  AB, OTR/L  Acute Rehabilitation Services  Office: [phone]   Curtistine JONETTA Das 10/15/2023, 5:09 PM

## 2023-10-15 NOTE — Procedures (Signed)
 Interventional Radiology Procedure Note  Procedure: Single Lumen Power Port Placement    Access:  Right internal jugular vein  Findings: Catheter tip positioned at cavoatrial junction. Port is ready for immediate use.   Complications: None  EBL: < 10 mL  Recommendations:  - Ok to shower in 24 hours - Do not submerge for 7 days - Routine line care    Marliss Coots, MD

## 2023-10-15 NOTE — Progress Notes (Signed)
 [facility]                                                                              Frank [name], is a 58 y.o. male, DOB - [birth year], FMW:[id number] Admit date - 07/03/2023    Outpatient Primary MD for the patient is [name], MD  LOS - 104  days  No chief complaint on file.      Brief summary   58 year old PMH OSA CPAP, pathological fracture and multiple ORIF left Humerus likely due to multiple myeloma, A-fib not on anticoagulation, hyperlipidemia, tobacco use disorder who was feeling weak for 1 week, admitted to [facility] health with concern of sepsis, community-acquired pneumonia and acute encephalopathy. Patient was found to have lytic lesions, he was transferred to [facility] for oncology treatment. In the meantime he developed encephalopathy, acute renal failure. He remained in the ICU for 36 days. Underwent tracheostomy. He was also started on HD after CRRT,off of HD now. Subsequently PEG tube placed 5/29 and IR drain placement of right thigh hip infected hematoma on 7/11 . S/p PEG tube removal. Needs Port-A-Cath placement (for outpatient chemo), either during this hospitalization or as an outpatient. F/u IR recommendations for drain management. Started on heparin  drip on 7/17 because of b/l LE DVT.  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 on 7/28.  Surgical EBL 750 cc. Oncology and IR following.   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 - continue pain management, currently controlled.   Infected right flank/gluteal hematoma -Underwent IR drain placement for infected Hematoma on 7/11.  -Drain exchanged on 7/25. -Completed antibiotic course on 7/26, cultures negative  -IR following.   Sepsis in the setting of Staph Epi Bacteremia  -blood culture with MRSE in 1  out of 4 bottles.  - Completed a course of vancomycin , 6/20, received a line holiday.   - Repeat blood culture 6/9 without growth.  Completed zyvox  on 7/26   Multiple myeloma:  - Started chemo with Velcade /Cytoxan  on 08/21/2023.  Chemo changed to Sarclisa /Kyprolis /Cytoxan  later on.  Currently on hold pending left arm amputation.   -Myeloma responded well to treatment, IgG decreased from [phone] (normal range). -Oncology, Dr [name] following and planning to resume treatment  -plan for Port-A-Cath placement today    AKI on CKD-3A/uremia:  - due to MM, hypercalcemia and ACE inhibitors?  Briefly required hemodialysis.   - AKI resolved and TDC removed.  Cr started to trend up again likely for multiple myeloma. - Cr stabilizing   BLE DVT:  - US  venous doppler on 7/16 positive for DVT in his RLE PTV, left CFV, SFJ, and PFV. - Now on Eliquis .   Left sided pleuritic chest wall pain:   - No rib fractures seen on CXR - Analgesics as needed.   Aspergillosis with pneumonia  -Continue voriconazole .  End date on 8/5.   Acute Hypoxic Respiratory Failure-trach removed 6/11.  Decannulated.   Severe acute metabolic encephalopathy  with hypoactive delirium, seizure disorder - Normal MRI of the brain.  LP negative for meningitis or encephalitis.  EEG negative for seizures but encephalopathy.  -Resolved   Acute postoperative blood loss anemia superimposed on anemia of renal disease:  - H/H stable    IDDM-2 with hyperglycemia - Hb A1c 6.0%. -Continue with sliding scale insulin .   CBG (last 3)  Recent Labs    10/14/23 1808 10/14/23 2022 10/15/23 0750  GLUCAP 139* 124* 102*      Paroxysmal A-fib:: Rate controlled. -Continue verapamil  -Eliquis  for anticoagulation.   Essential hypertension: Normotensive. - Continue verapamil    Obstructive Sleep Apnea  - Trach removed. CPAP    Anxiety/Bipolar disorder  -  Continue Paxil .     Hypokalemia/hyperkalemia: K5.8. -K better but still  5.3 - Lokelma  x 1    Hyponatremia:  - continue to follow.   Dysphagia:  - Resolved.  PEG tube removed.  On regular diet.    RN Pressure Injury Documentation: Pressure Injury 07/05/23 Heel Left;Right Unstageable - Full thickness tissue loss in which the base of the injury is covered by slough (yellow, tan, gray, green or brown) and/or eschar (tan, brown or black) in the wound bed. (Active)  07/05/23 1108  Location: Heel  Location Orientation: Left;Right  Staging: Unstageable - Full thickness tissue loss in which the base of the injury is covered by slough (yellow, tan, gray, green or brown) and/or eschar (tan, brown or black) in the wound bed.  Wound Description (Comments):   DO NOT USE:  Present on Admission: Yes  Dressing Type None 10/12/23 1935     Pressure Injury 07/11/23 Sacrum Mid Unstageable - Full thickness tissue loss in which the base of the injury is covered by slough (yellow, tan, gray, green or brown) and/or eschar (tan, brown or black) in the wound bed. (Active)  07/11/23 1600  Location: Sacrum  Location Orientation: Mid  Staging: Unstageable - Full thickness tissue loss in which the base of the injury is covered by slough (yellow, tan, gray, green or brown) and/or eschar (tan, brown or black) in the wound bed.  Wound Description (Comments):   DO NOT USE:  Present on Admission: No  Dressing Type Gauze (Comment);Santyl ;Barrier Film (skin prep);Foam - Lift dressing to assess site every shift 10/07/23 1400     Pressure Injury 07/27/23 Buttocks Right;Lower (Active)  07/27/23 1700  Location: Buttocks  Location Orientation: Right;Lower  Staging:   Wound Description (Comments):   DO NOT USE:  Present on Admission: Yes  Dressing Type None 10/13/23 0515     Pressure Injury 08/25/23 Buttocks Left Stage 2 -  Partial thickness loss of dermis presenting as a shallow open injury with a red, pink wound bed without slough. (Active)  08/25/23   Location: Buttocks  Location  Orientation: Left  Staging: Stage 2 -  Partial thickness loss of dermis presenting as a shallow open injury with a red, pink wound bed without slough.  Wound Description (Comments):   DO NOT USE:  Present on Admission: No  Dressing Type None 10/13/23 0515    Severe protein calorie malnutrition  Nutrition Problem: Severe Malnutrition Etiology: acute illness (prolonged illness, interuptions in feeding) Signs/Symptoms: moderate fat depletion, moderate muscle depletion, percent weight loss (9.6% x 1 month) Percent weight loss: 9.6 % Interventions: Ensure Enlive (each supplement provides 350kcal and 20 grams of protein), MVI, Liberalize Diet, Prostat, Juven  Obesity class I Estimated body mass index is 31.99 kg/m as calculated from the following:   Height  as of this encounter: 6' (1.829 m).   Weight as of this encounter: 107 kg.  Code Status: full  DVT Prophylaxis:  SCDs Start: 10/13/23 1123 Place and maintain sequential compression device Start: 07/14/23 1033 apixaban  (ELIQUIS ) tablet 5 mg   Level of Care: Level of care: Telemetry Medical Family Communication:  Disposition Plan:      Remains inpatient appropriate:      Procedures:    Consultants:     Antimicrobials:   Anti-infectives (From admission, onward)    Start     Dose/Rate Route Frequency Ordered Stop   10/13/23 1400  ceFAZolin  (ANCEF ) IVPB 2g/100 mL premix        2 g 200 mL/hr over 30 Minutes Intravenous Every 8 hours 10/13/23 1254 10/14/23 0618   10/13/23 0937  vancomycin  (VANCOCIN ) powder  Status:  Discontinued          As needed 10/13/23 0937 10/13/23 1002   10/10/23 0900  doxycycline  200mg  in 20 mL SWI irrigation / sclerosing solution for IR       Note to Pharmacy: For irrigation only. WARNING - SCLEROSING AGENT  #5 vials sent to IR for procedure   500 mg Irrigation To Radiology 10/07/23 1418 10/10/23 1015   10/10/23 0000  doxycycline  (VIBRAMYCIN ) 500 mg in dextrose  5 % 250 mL IVPB  Status:  Discontinued         500 mg 125 mL/hr over 120 Minutes Intravenous To Radiology 10/07/23 1402 10/07/23 1409   10/10/23 0000  doxycycline  200mg  in 20 mL SWI irrigation / sclerosing solution for IR  Status:  Discontinued        200 mg Irrigation To Radiology 10/07/23 1418 10/07/23 1456   10/10/23 0000  doxycycline  200mg  in 20 mL SWI irrigation / sclerosing solution for IR  Status:  Discontinued        200 mg Irrigation To Radiology 10/07/23 1440 10/07/23 1456   10/02/23 2200  linezolid  (ZYVOX ) tablet 600 mg        600 mg Oral Every 12 hours 10/01/23 0852 10/09/23 2108   10/02/23 1000  linezolid  (ZYVOX ) tablet 600 mg  Status:  Discontinued        600 mg Oral Every 12 hours 10/01/23 0723 10/01/23 0852   09/30/23 1300  vancomycin  variable dose per unstable renal function (pharmacist dosing)  Status:  Discontinued         Does not apply See admin instructions 09/30/23 1301 10/01/23 0725   09/29/23 1145  amoxicillin -clavulanate (AUGMENTIN ) 875-125 MG per tablet 1 tablet        1 tablet Oral Every 12 hours 09/29/23 1047 10/09/23 2108   09/27/23 0200  vancomycin  (VANCOREADY) IVPB 1500 mg/300 mL        1,500 mg 150 mL/hr over 120 Minutes Intravenous Every 12 hours 09/26/23 1402 09/30/23 1448   09/26/23 1500  cefTRIAXone  (ROCEPHIN ) 2 g in sodium chloride  0.9 % 100 mL IVPB  Status:  Discontinued        2 g 200 mL/hr over 30 Minutes Intravenous Every 24 hours 09/26/23 1402 09/29/23 1047   09/26/23 1315  vancomycin  (VANCOREADY) IVPB 2000 mg/400 mL        2,000 mg 200 mL/hr over 120 Minutes Intravenous  Once 09/26/23 1220 09/26/23 1656   09/26/23 1230  metroNIDAZOLE  (FLAGYL ) IVPB 500 mg  Status:  Discontinued        500 mg 100 mL/hr over 60 Minutes Intravenous Every 12 hours 09/26/23 1137 09/29/23 1138   09/22/23 2200  voriconazole  (VFEND ) tablet 400 mg        400 mg Oral Every 12 hours 09/22/23 1521 10/21/23 2359   09/11/23 2200  acyclovir  (ZOVIRAX ) 200 MG capsule 400 mg       Note to Pharmacy: Give after dialysis  the pm dose, on dialysis days   400 mg Oral 2 times daily 09/11/23 1551     09/11/23 2200  voriconazole  (VFEND ) tablet 350 mg  Status:  Discontinued        350 mg Oral Every 12 hours 09/11/23 1551 09/22/23 1521   09/07/23 1000  acyclovir  (ZOVIRAX ) 200 MG capsule 400 mg  Status:  Discontinued       Note to Pharmacy: Give after dialysis the pm dose, on dialysis days   400 mg Per Tube 2 times daily 09/07/23 0825 09/11/23 1551   09/03/23 1200  vancomycin  (VANCOCIN ) IVPB 1000 mg/200 mL premix  Status:  Discontinued        1,000 mg 200 mL/hr over 60 Minutes Intravenous Every M-W-F (Hemodialysis) 09/02/23 1021 09/03/23 0942   09/03/23 0942  vancomycin  variable dose per unstable renal function (pharmacist dosing)  Status:  Discontinued         Does not apply See admin instructions 09/03/23 0942 09/03/23 0945   09/02/23 0915  vancomycin  (VANCOCIN ) IVPB 1000 mg/200 mL premix        1,000 mg 200 mL/hr over 60 Minutes Intravenous  Once 09/02/23 0830 09/02/23 1215   09/01/23 0834  ceFAZolin  (ANCEF ) IVPB 2g/100 mL premix        over 30 Minutes Intravenous Continuous PRN 09/01/23 0834 09/01/23 0834   08/29/23 0914  vancomycin  variable dose per unstable renal function (pharmacist dosing)  Status:  Discontinued         Does not apply See admin instructions 08/29/23 0914 09/02/23 1021   08/27/23 1400  vancomycin  (VANCOCIN ) IVPB 1000 mg/200 mL premix        1,000 mg 200 mL/hr over 60 Minutes Intravenous  Once 08/27/23 0920 08/27/23 1653   08/25/23 1200  vancomycin  (VANCOCIN ) IVPB 1000 mg/200 mL premix        1,000 mg 200 mL/hr over 60 Minutes Intravenous Every M-W-F (Hemodialysis) 08/24/23 1405 08/29/23 1240   08/24/23 1445  vancomycin  (VANCOREADY) IVPB 2000 mg/400 mL        2,000 mg 200 mL/hr over 120 Minutes Intravenous  Once 08/24/23 1353 08/25/23 0323   08/14/23 0600  ceFAZolin  (ANCEF ) IVPB 2g/100 mL premix        2 g 200 mL/hr over 30 Minutes Intravenous To Radiology 08/13/23 1055 08/14/23 0606    08/09/23 1000  acyclovir  (ZOVIRAX ) 200 MG capsule 200 mg  Status:  Discontinued       Note to Pharmacy: Give after dialysis the pm dose, on dialysis days   200 mg Per Tube 2 times daily 08/08/23 2358 09/07/23 0825   08/09/23 0100  acyclovir  (ZOVIRAX ) 200 MG capsule 200 mg       Note to Pharmacy: Give after dialysis the pm dose, on dialysis days   200 mg Per Tube  Once 08/09/23 0009 08/09/23 0013   08/08/23 2345  acyclovir  (ZOVIRAX ) 200 MG capsule 200 mg  Status:  Discontinued       Note to Pharmacy: Give after dialysis the pm dose, on dialysis days   200 mg Oral 2 times daily 08/08/23 2257 08/08/23 2358   08/07/23 1530  ceFAZolin  (ANCEF ) IVPB 2g/100 mL premix        over  30 Minutes Intravenous Continuous PRN 08/07/23 1555 08/07/23 1530   08/05/23 2200  voriconazole  (VFEND ) tablet 350 mg  Status:  Discontinued        350 mg Per Tube Every 12 hours 08/05/23 1511 09/11/23 1551   08/01/23 1200  vancomycin  (VANCOCIN ) IVPB 1000 mg/200 mL premix        1,000 mg 200 mL/hr over 60 Minutes Intravenous Every M-W-F (Hemodialysis) 07/31/23 0649 08/01/23 1652   07/30/23 1200  vancomycin  (VANCOCIN ) IVPB 1000 mg/200 mL premix        1,000 mg 200 mL/hr over 60 Minutes Intravenous Every M-W-F (Hemodialysis) 07/30/23 0851 07/30/23 1241   07/29/23 0900  vancomycin  (VANCOCIN ) IVPB 1000 mg/200 mL premix        1,000 mg 200 mL/hr over 60 Minutes Intravenous  Once 07/29/23 0812 07/29/23 0943   07/28/23 1204  vancomycin  variable dose per unstable renal function (pharmacist dosing)  Status:  Discontinued         Does not apply See admin instructions 07/28/23 1204 07/31/23 0649   07/28/23 1000  voriconazole  (VFEND ) 350 mg in sodium chloride  0.9 % 100 mL IVPB  Status:  Discontinued       Placed in Followed by Linked Group   4 mg/kg  88.6 kg (Adjusted) 67.5 mL/hr over 120 Minutes Intravenous Every 12 hours 07/28/23 0748 08/05/23 1511   07/27/23 1000  voriconazole  (VFEND ) 400 mg in sodium chloride  0.9 % 100 mL IVPB   Status:  Discontinued       Placed in Followed by Linked Group   4 mg/kg  100.6 kg 70 mL/hr over 120 Minutes Intravenous Every 12 hours 07/26/23 0826 07/28/23 0748   07/27/23 0945  vancomycin  (VANCOREADY) IVPB 2000 mg/400 mL        2,000 mg 200 mL/hr over 120 Minutes Intravenous  Once 07/27/23 0849 07/27/23 1237   07/26/23 1515  Ampicillin -Sulbactam (UNASYN ) 3 g in sodium chloride  0.9 % 100 mL IVPB  Status:  Discontinued        3 g 200 mL/hr over 30 Minutes Intravenous Every 8 hours 07/26/23 1418 07/27/23 0830   07/26/23 0915  voriconazole  (VFEND ) 600 mg in sodium chloride  0.9 % 150 mL IVPB       Placed in Followed by Linked Group   6 mg/kg  100.6 kg 105 mL/hr over 120 Minutes Intravenous Every 12 hours 07/26/23 0826 07/27/23 0048   07/26/23 0900  Ampicillin -Sulbactam (UNASYN ) 3 g in sodium chloride  0.9 % 100 mL IVPB  Status:  Discontinued        3 g 200 mL/hr over 30 Minutes Intravenous Every 12 hours 07/26/23 0826 07/26/23 1418   07/23/23 1000  levofloxacin  (LEVAQUIN ) IVPB 500 mg  Status:  Discontinued       Placed in Followed by Linked Group   500 mg 100 mL/hr over 60 Minutes Intravenous Every 24 hours 07/22/23 1213 07/23/23 1001   07/23/23 1000  levofloxacin  (LEVAQUIN ) IVPB 500 mg  Status:  Discontinued       Placed in Followed by Linked Group   500 mg 100 mL/hr over 60 Minutes Intravenous Every 24 hours 07/23/23 1001 07/28/23 0804   07/22/23 1300  levofloxacin  (LEVAQUIN ) IVPB 750 mg       Placed in Followed by Linked Group   750 mg 100 mL/hr over 90 Minutes Intravenous  Once 07/22/23 1213 07/22/23 1542   07/21/23 1600  voriconazole  (VFEND ) 300 mg in sodium chloride  0.9 % 100 mL IVPB  Status:  Discontinued  300 mg 65 mL/hr over 120 Minutes Intravenous Every 12 hours 07/21/23 1430 07/22/23 1415   07/21/23 1000  voriconazole  (VFEND ) tablet 200 mg  Status:  Discontinued        200 mg Oral Every 12 hours 07/20/23 0944 07/21/23 1015   07/20/23 1030  voriconazole   (VFEND ) 530 mg in sodium chloride  0.9 % 150 mL IVPB        6 mg/kg  88.6 kg (Adjusted) 101.5 mL/hr over 120 Minutes Intravenous Every 12 hours 07/20/23 0944 07/21/23 0030   07/19/23 1400  meropenem  (MERREM ) 1 g in sodium chloride  0.9 % 100 mL IVPB  Status:  Discontinued        1 g 200 mL/hr over 30 Minutes Intravenous Every 8 hours 07/19/23 1143 07/22/23 1213   07/18/23 0600  meropenem  (MERREM ) 1 g in sodium chloride  0.9 % 100 mL IVPB  Status:  Discontinued        1 g 200 mL/hr over 30 Minutes Intravenous Every 24 hours 07/17/23 0916 07/19/23 1143   07/17/23 0700  meropenem  (MERREM ) 1 g in sodium chloride  0.9 % 100 mL IVPB  Status:  Discontinued        1 g 200 mL/hr over 30 Minutes Intravenous Every 12 hours 07/17/23 0601 07/17/23 0916   07/15/23 1300  vancomycin  (VANCOCIN ) IVPB 1000 mg/200 mL premix  Status:  Discontinued        1,000 mg 200 mL/hr over 60 Minutes Intravenous Every 24 hours 07/14/23 1129 07/22/23 1319   07/14/23 1215  vancomycin  (VANCOREADY) IVPB 1750 mg/350 mL        1,750 mg 175 mL/hr over 120 Minutes Intravenous  Once 07/14/23 1129 07/14/23 1449   07/09/23 1800  piperacillin -tazobactam (ZOSYN ) IVPB 3.375 g  Status:  Discontinued        3.375 g 12.5 mL/hr over 240 Minutes Intravenous Every 12 hours 07/09/23 0756 07/09/23 0844   07/09/23 1400  piperacillin -tazobactam (ZOSYN ) IVPB 3.375 g  Status:  Discontinued        3.375 g 12.5 mL/hr over 240 Minutes Intravenous Every 8 hours 07/09/23 0844 07/17/23 0557   07/08/23 1300  piperacillin -tazobactam (ZOSYN ) IVPB 3.375 g  Status:  Discontinued        3.375 g 12.5 mL/hr over 240 Minutes Intravenous Every 8 hours 07/08/23 1129 07/09/23 0756   07/05/23 2200  azithromycin  (ZITHROMAX ) 500 mg in sodium chloride  0.9 % 250 mL IVPB        500 mg 250 mL/hr over 60 Minutes Intravenous Daily at bedtime 07/05/23 2022 07/06/23 2302   07/05/23 2115  azithromycin  (ZITHROMAX ) 250 mg in dextrose  5 % 125 mL IVPB  Status:  Discontinued        Note to Pharmacy: Missed dose this morning   250 mg 127.5 mL/hr over 60 Minutes Intravenous Every 24 hours 07/05/23 2016 07/05/23 2021   07/04/23 0900  fluconazole  (DIFLUCAN ) IVPB 200 mg  Status:  Discontinued        200 mg 100 mL/hr over 60 Minutes Intravenous Every 48 hours 07/04/23 0722 07/09/23 0809   07/03/23 0600  cefTRIAXone  (ROCEPHIN ) 1 g in sodium chloride  0.9 % 100 mL IVPB  Status:  Discontinued        1 g 200 mL/hr over 30 Minutes Intravenous Every 24 hours 07/03/23 0508 07/08/23 1104   07/03/23 0600  azithromycin  (ZITHROMAX ) tablet 250 mg  Status:  Discontinued        250 mg Oral Daily 07/03/23 0508 07/05/23 2016  Medications  (feeding supplement) PROSource Plus  30 mL Oral TID BM   acyclovir   400 mg Oral BID   apixaban   5 mg Oral BID   collagenase    Topical Daily   docusate sodium   100 mg Oral BID   epoetin  alfa  40,000 Units Subcutaneous Q Wed-1800   feeding supplement  237 mL Oral BID WC   guaiFENesin   600 mg Oral BID   hydrALAZINE   25 mg Oral Q8H   insulin  aspart  0-15 Units Subcutaneous TID AC & HS   insulin  aspart  2 Units Subcutaneous TID AC & HS   insulin  glargine-yfgn  10 Units Subcutaneous Q24H   lidocaine   1 patch Transdermal Q24H   montelukast   5 mg Oral QHS   multivitamin with minerals  1 tablet Oral Daily   nutrition supplement (JUVEN)  1 packet Oral BID AC   mouth rinse  15 mL Mouth Rinse 4 times per day   pantoprazole   40 mg Oral BID   PARoxetine   40 mg Oral Daily   polyethylene glycol  17 g Oral BID   senna  1 tablet Oral Daily   sodium chloride  flush  10-40 mL Intracatheter Q12H   umeclidinium-vilanterol  1 puff Inhalation Daily   valproic  acid  750 mg Oral BID   verapamil   120 mg Oral Daily   voriconazole   400 mg Oral Q12H      Subjective:   Frank [name] was seen and examined today.  Pain in left upper arm, no other acute issues.  Patient denies dizziness, chest pain, shortness of breath, abdominal pain, N/V.   Objective:    Vitals:   10/14/23 2150 10/15/23 0036 10/15/23 0434 10/15/23 0749  BP: (!) 117/96 (!) 132/101 (!) 123/90 (!) 127/97  Pulse: 95 95 (!) 101 (!) 110  Resp: 18 18 19 18   Temp: 98.4 F (36.9 C) 98.4 F (36.9 C) 98.1 F (36.7 C) 98 F (36.7 C)  TempSrc: Oral Oral Oral   SpO2: 96% 96% 94% 95%  Weight:      Height:        Intake/Output Summary (Last 24 hours) at 10/15/2023 0947 Last data filed at 10/15/2023 0500 Gross per 24 hour  Intake 396 ml  Output 2935 ml  Net -2539 ml     Wt Readings from Last 3 Encounters:  10/14/23 107 kg  06/05/23 113.7 kg  01/29/23 117.9 kg     Exam General: Alert and oriented x 3, NAD Cardiovascular: S1 S2 auscultated,  RRR Respiratory: Clear to auscultation bilaterally, no wheezing Gastrointestinal: Soft, nontender, nondistended, + bowel sounds Ext: no pedal edema bilaterally. Left arm disarticulation, dressing intact   Neuro: B/L LE weakness  Psych: Normal affect     Data Reviewed:  I have personally reviewed following labs    CBC Lab Results  Component Value Date   WBC 8.5 10/15/2023   RBC 3.12 (L) 10/15/2023   HGB 10.0 (L) 10/15/2023   HCT 30.6 (L) 10/15/2023   MCV 98.1 10/15/2023   MCH 32.1 10/15/2023   PLT 297 10/15/2023   MCHC 32.7 10/15/2023   RDW 21.7 (H) 10/15/2023   LYMPHSABS 1.2 10/06/2023   MONOABS 0.9 10/06/2023   EOSABS 0.1 10/06/2023   BASOSABS 0.0 10/06/2023     Last metabolic panel Lab Results  Component Value Date   NA 129 (L) 10/15/2023   K 5.3 (H) 10/15/2023   CL 98 10/15/2023   CO2 21 (L) 10/15/2023   BUN 40 (H) 10/15/2023  CREATININE 1.49 (H) 10/15/2023   GLUCOSE 112 (H) 10/15/2023   GFRNONAA 54 (L) 10/15/2023   GFRAA >60 12/27/2017   CALCIUM  8.8 (L) 10/15/2023   PHOS 6.2 (H) 10/15/2023   PROT 5.5 (L) 10/07/2023   ALBUMIN  2.6 (L) 10/15/2023   LABGLOB 2.6 09/12/2023   AGRATIO 0.9 09/12/2023   BILITOT 0.5 10/07/2023   ALKPHOS 83 10/07/2023   AST 9 (L) 10/07/2023   ALT 5 10/07/2023    ANIONGAP 10 10/15/2023    CBG (last 3)  Recent Labs    10/14/23 1808 10/14/23 2022 10/15/23 0750  GLUCAP 139* 124* 102*      Coagulation Profile: No results for input(s): INR, PROTIME in the last 168 hours.   Radiology Studies: I have personally reviewed the imaging studies  No results found.     Nydia Distance M.D. [facility] 10/15/2023, 9:47 AM  Available via Epic secure chat 7am-7pm After 7 pm, please refer to night coverage provider listed on amion.

## 2023-10-16 ENCOUNTER — Other Ambulatory Visit: Payer: Self-pay | Admitting: Student

## 2023-10-16 ENCOUNTER — Other Ambulatory Visit: Payer: Self-pay

## 2023-10-16 DIAGNOSIS — C9000 Multiple myeloma not having achieved remission: Secondary | ICD-10-CM | POA: Diagnosis not present

## 2023-10-16 DIAGNOSIS — A419 Sepsis, unspecified organism: Secondary | ICD-10-CM | POA: Diagnosis not present

## 2023-10-16 DIAGNOSIS — I5032 Chronic diastolic (congestive) heart failure: Secondary | ICD-10-CM | POA: Diagnosis not present

## 2023-10-16 DIAGNOSIS — R531 Weakness: Secondary | ICD-10-CM | POA: Diagnosis not present

## 2023-10-16 LAB — CBC
HCT: 28.9 % — ABNORMAL LOW (ref 39.0–52.0)
Hemoglobin: 9.4 g/dL — ABNORMAL LOW (ref 13.0–17.0)
MCH: 32.3 pg (ref 26.0–34.0)
MCHC: 32.5 g/dL (ref 30.0–36.0)
MCV: 99.3 fL (ref 80.0–100.0)
Platelets: 291 K/uL (ref 150–400)
RBC: 2.91 MIL/uL — ABNORMAL LOW (ref 4.22–5.81)
RDW: 20.5 % — ABNORMAL HIGH (ref 11.5–15.5)
WBC: 7.7 K/uL (ref 4.0–10.5)
nRBC: 0 % (ref 0.0–0.2)

## 2023-10-16 LAB — RENAL FUNCTION PANEL
Albumin: 2.3 g/dL — ABNORMAL LOW (ref 3.5–5.0)
Anion gap: 11 (ref 5–15)
BUN: 38 mg/dL — ABNORMAL HIGH (ref 6–20)
CO2: 21 mmol/L — ABNORMAL LOW (ref 22–32)
Calcium: 8.2 mg/dL — ABNORMAL LOW (ref 8.9–10.3)
Chloride: 96 mmol/L — ABNORMAL LOW (ref 98–111)
Creatinine, Ser: 1.42 mg/dL — ABNORMAL HIGH (ref 0.61–1.24)
GFR, Estimated: 57 mL/min — ABNORMAL LOW (ref >60)
Glucose, Bld: 126 mg/dL — ABNORMAL HIGH (ref 70–99)
Phosphorus: 4.7 mg/dL — ABNORMAL HIGH (ref 2.5–4.6)
Potassium: 4.5 mmol/L (ref 3.5–5.1)
Sodium: 128 mmol/L — ABNORMAL LOW (ref 135–145)

## 2023-10-16 LAB — GLUCOSE, CAPILLARY
Glucose-Capillary: 159 mg/dL — ABNORMAL HIGH (ref 70–99)
Glucose-Capillary: 119 mg/dL — ABNORMAL HIGH (ref 70–99)
Glucose-Capillary: 136 mg/dL — ABNORMAL HIGH (ref 70–99)
Glucose-Capillary: 164 mg/dL — ABNORMAL HIGH (ref 70–99)

## 2023-10-16 LAB — SURGICAL PATHOLOGY

## 2023-10-16 MED ORDER — STERILE WATER FOR INJECTION IJ SOLN
200.0000 mg | INTRAVENOUS | Status: AC
  Administered 2023-10-23: 200 mg
  Filled 2023-10-16 (×3): qty 200

## 2023-10-16 MED ORDER — STERILE WATER FOR INJECTION IJ SOLN
200.0000 mg | INTRAVENOUS | Status: AC
  Administered 2023-10-23: 200 mg
  Filled 2023-10-16 (×2): qty 200

## 2023-10-16 MED ORDER — SODIUM CHLORIDE 0.9% FLUSH
10.0000 mL | INTRAVENOUS | Status: DC | PRN
  Administered 2023-10-25 – 2023-10-29 (×3): 10 mL

## 2023-10-16 NOTE — [place] FL2 (Signed)
 [place]  MEDICAID FL2 LEVEL OF CARE FORM     IDENTIFICATION  Patient Name: Frank [name]. Birthdate: [birth year] Sex: male Admission Date (Current Location): 07/03/2023  [place] and [place] Number:   ([place] and [place])   Facility and Address:  [facility] [place], [address], [place], [place] [address]      Provider Number: [id number]  Attending Physician Name and Address:  Davia Nydia POUR, MD  Relative Name and Phone Number:  Dariel Betzer; Significant Other; [phone]    Current Level of Care: Hospital Recommended Level of Care: Skilled Nursing Facility Prior Approval Number:    Date Approved/Denied:   PASRR Number:    Discharge Plan: SNF    Current Diagnoses: Patient Active Problem List   Diagnosis Date Noted   Generalized weakness 09/22/2023   Anemia 09/22/2023   CAD (coronary artery disease) 09/14/2023   Chronic diastolic CHF (congestive heart failure) (HCC) 09/14/2023   CKD (chronic kidney disease), stage IIIa (HCC) 09/14/2023   Class 1 obesity 09/14/2023   Asthma 09/14/2023   Bipolar disorder (HCC) 09/14/2023   Febrile neutropenia (HCC) 09/14/2023   Pressure injury of skin 09/11/2023   Goals of care, counseling/discussion 08/20/2023   Hematoma of right thigh 08/20/2023   Seizure disorder (HCC) 08/20/2023   DMII (diabetes mellitus, type 2) (HCC) 08/20/2023   Aspergillosis, unspecified (HCC) 08/06/2023   Fungal infection 08/05/2023   Protein-calorie malnutrition, severe 07/29/2023   Lobar pneumonia, unspecified organism (HCC) 07/21/2023   Acute metabolic encephalopathy 07/19/2023   Acute encephalopathy 07/14/2023   Severe sepsis with septic shock (HCC) 07/14/2023   PAF (paroxysmal atrial fibrillation) (HCC) 07/09/2023   Acute hypoxemic respiratory failure (HCC) 07/08/2023   AKI (acute kidney injury) (HCC) 07/07/2023   Multiple myeloma not having achieved remission (HCC) 07/04/2023   Closed fracture of left  distal humerus 01/29/2023   Status post total left knee replacement 11/23/2021   Unilateral primary osteoarthritis, right knee 01/25/2019   Carpal tunnel syndrome, left upper limb 10/15/2018   Carpal tunnel syndrome, right upper limb 10/15/2018   Bilateral hand numbness 09/09/2018   Plantar fasciitis of left foot 09/09/2018   Status post total replacement of left hip 12/26/2017   Unilateral primary osteoarthritis, left knee 11/12/2017   Hypogonadism, male 05/08/2016    Orientation RESPIRATION BLADDER Height & Weight     Self, Situation, Place, Time  Normal (Room Air) Incontinent, External catheter Weight: 235 lb 14.3 oz (107 kg) Height:  6' (182.9 cm)  BEHAVIORAL SYMPTOMS/MOOD NEUROLOGICAL BOWEL NUTRITION STATUS    Convulsions/Seizures Incontinent Diet (Please see discharge summary)  AMBULATORY STATUS COMMUNICATION OF NEEDS Skin   Extensive Assist Verbally Other (Comment), Surgical wounds (Closed System Drain 1 Right Hip Bulb (JP) 10 Fr; Pressure Injury Heel Left;Right Unstageable; Pressure Injury Sacrum Mid Unstageable; Pressure Injury Buttocks Right;Lower; Pressure Injury Buttocks Left Stage 2; Closed Surgical Incision Arm Left)                       Personal Care Assistance Level of Assistance  Bathing, Feeding, Dressing Bathing Assistance: Maximum assistance Feeding assistance: Maximum assistance Dressing Assistance: Maximum assistance     Functional Limitations Info  Sight, Hearing Sight Info: Impaired (R and L) Hearing Info: Impaired (R and L) Speech Info: Impaired    SPECIAL CARE FACTORS FREQUENCY  PT (By licensed PT), OT (By licensed OT)     PT Frequency: 5x OT Frequency: 5x     Speech Therapy Frequency: 5x/wk  Contractures Contractures Info: Not present    Additional Factors Info  Code Status, Allergies, Psychotropic, Insulin  Sliding Scale Code Status Info: Full Code Allergies Info: Morphine  and Codeine; Shellfish; Betadine  (povidone Iodine );  Bupropion; Chlorhexidine ; Influenza Vaccines; Metoprolol  Psychotropic Info: Paxil  40 mg daily Insulin  Sliding Scale Info: Semglee  15 units SQ daily/ Novolog  2 units SQ three times a day/ Novolog  0-15 units SQ TID as needed   Suctioning Needs: oral suction   Current Medications (10/16/2023):  This is the current hospital active medication list Current Facility-Administered Medications  Medication Dose Route Frequency Provider Last Rate Last Admin   (feeding supplement) PROSource Plus liquid 30 mL  30 mL Oral TID BM McClung, Edrik Rundle A, PA-C   30 mL at 10/13/23 1832   0.9 %  sodium chloride  infusion   Intravenous Continuous Danton Lauraine LABOR, PA-C   Stopped at 10/13/23 9047   0.9 %  sodium chloride  infusion   Intravenous Continuous Danton Lauraine LABOR, PA-C   Stopped at 08/28/23 9160   acetaminophen  (TYLENOL ) 160 MG/5ML solution 650 mg  650 mg Oral Q4H PRN Danton Lauraine LABOR, PA-C   650 mg at 10/05/23 2144   Or   acetaminophen  (TYLENOL ) suppository 650 mg  650 mg Rectal Q4H PRN Danton Lauraine LABOR, PA-C       acyclovir  (ZOVIRAX ) 200 MG capsule 400 mg  400 mg Oral BID Danton Lauraine LABOR, PA-C   400 mg at 10/15/23 2130   alum & mag hydroxide-simeth (MAALOX/MYLANTA) 200-200-20 MG/5ML suspension 30 mL  30 mL Oral Q4H PRN Danton Lauraine LABOR, PA-C   30 mL at 09/26/23 2331   apixaban  (ELIQUIS ) tablet 5 mg  5 mg Oral BID Millen, Jessica B, RPH   5 mg at 10/15/23 2130   benzonatate  (TESSALON ) capsule 100 mg  100 mg Oral TID PRN Danton Lauraine LABOR, PA-C   100 mg at 09/28/23 2209   collagenase  (SANTYL ) ointment   Topical Daily Danton Lauraine LABOR, PA-C   Given at 10/15/23 1451   cyclobenzaprine  (FLEXERIL ) tablet 5 mg  5 mg Oral TID PRN Danton Lauraine LABOR, PA-C   5 mg at 10/15/23 2130   docusate sodium  (COLACE) capsule 100 mg  100 mg Oral BID Danton Lauraine LABOR, PA-C   100 mg at 10/15/23 2130   epoetin  alfa (EPOGEN ) injection 40,000 Units  40,000 Units Subcutaneous Q Wed-1800 Danton Lauraine LABOR, PA-C   40,000 Units at 10/15/23 1840    feeding supplement (ENSURE PLUS HIGH PROTEIN) liquid 237 mL  237 mL Oral BID WC Danton Lauraine LABOR, PA-C   237 mL at 10/14/23 1818   guaiFENesin  (MUCINEX ) 12 hr tablet 600 mg  600 mg Oral BID Danton Lauraine LABOR, PA-C   600 mg at 10/15/23 2130   heparin  lock flush 100 unit/mL  500 Units Intravenous Once Shick, Michael, MD       hydrALAZINE  (APRESOLINE ) tablet 25 mg  25 mg Oral Q8H McClung, Coltrane Tugwell A, PA-C   25 mg at 10/16/23 0423   HYDROmorphone  (DILAUDID ) injection 0.5-1 mg  0.5-1 mg Intravenous Q4H PRN Danton Lauraine LABOR, PA-C   1 mg at 10/16/23 0034   hydrOXYzine  (ATARAX ) tablet 10 mg  10 mg Oral TID PRN Danton Lauraine LABOR, PA-C   10 mg at 10/15/23 2130   insulin  aspart (novoLOG ) injection 0-15 Units  0-15 Units Subcutaneous TID AC & HS Danton Lauraine LABOR, PA-C   3 Units at 10/16/23 9095   insulin  aspart (novoLOG ) injection 2 Units  2 Units Subcutaneous TID AC &  HS Danton Lauraine LABOR, PA-C   2 Units at 10/16/23 9094   insulin  glargine-yfgn (SEMGLEE ) injection 10 Units  10 Units Subcutaneous Q24H Danton Lauraine LABOR, PA-C   10 Units at 10/15/23 1451   ipratropium-albuterol  (DUONEB) 0.5-2.5 (3) MG/3ML nebulizer solution 3 mL  3 mL Nebulization Q4H PRN Danton Lauraine LABOR, PA-C   3 mL at 10/12/23 9083   lidocaine  (LIDODERM ) 5 % 1 patch  1 patch Transdermal Q24H Danton Lauraine LABOR, PA-C   1 patch at 10/08/23 9192   metoCLOPramide  (REGLAN ) tablet 5-10 mg  5-10 mg Oral Q8H PRN Danton Lauraine LABOR, PA-C       Or   metoCLOPramide  (REGLAN ) injection 5-10 mg  5-10 mg Intravenous Q8H PRN Danton Lauraine LABOR, PA-C       montelukast  (SINGULAIR ) chewable tablet 5 mg  5 mg Oral QHS Danton Lauraine LABOR, PA-C   5 mg at 10/15/23 2130   multivitamin with minerals tablet 1 tablet  1 tablet Oral Daily Danton Lauraine LABOR, PA-C   1 tablet at 10/15/23 1449   nutrition supplement (JUVEN) (JUVEN) powder packet 1 packet  1 packet Oral BID AC McClung, Keiondra Brookover A, PA-C   1 packet at 10/15/23 1500   ondansetron  (ZOFRAN ) injection 4 mg  4 mg Intravenous Q6H PRN  Danton Lauraine LABOR, PA-C   4 mg at 09/07/23 0247   Oral care mouth rinse  15 mL Mouth Rinse 4 times per day Danton Lauraine LABOR, PA-C   15 mL at 10/16/23 9094   oxyCODONE  (Oxy IR/ROXICODONE ) immediate release tablet 10 mg  10 mg Oral Q4H PRN Danton Lauraine LABOR, PA-C   10 mg at 10/16/23 0423   pantoprazole  (PROTONIX ) EC tablet 40 mg  40 mg Oral BID Danton Lauraine LABOR, PA-C   40 mg at 10/15/23 2130   PARoxetine  (PAXIL ) tablet 40 mg  40 mg Oral Daily Danton Lauraine LABOR, PA-C   40 mg at 10/15/23 1449   polyethylene glycol (MIRALAX  / GLYCOLAX ) packet 17 g  17 g Oral BID Danton Lauraine LABOR, PA-C   17 g at 10/14/23 9070   polyethylene glycol (MIRALAX  / GLYCOLAX ) packet 17 g  17 g Oral Daily PRN Danton Lauraine LABOR, PA-C   17 g at 10/16/23 0034   polyvinyl alcohol  (LIQUIFILM TEARS) 1.4 % ophthalmic solution 1 drop  1 drop Both Eyes PRN Danton Lauraine LABOR, PA-C   1 drop at 07/18/23 0555   senna (SENOKOT) tablet 8.6 mg  1 tablet Oral Daily Danton Lauraine LABOR, PA-C   8.6 mg at 10/14/23 0932   sodium chloride  flush (NS) 0.9 % injection 10-40 mL  10-40 mL Intracatheter Q12H Danton Lauraine LABOR, PA-C   10 mL at 10/15/23 2132   sodium chloride  flush (NS) 0.9 % injection 10-40 mL  10-40 mL Intracatheter PRN Danton Lauraine LABOR, PA-C       sodium chloride  flush (NS) 0.9 % injection 10-40 mL  10-40 mL Intracatheter PRN Rai, Ripudeep K, MD       umeclidinium-vilanterol (ANORO ELLIPTA ) 62.5-25 MCG/ACT 1 puff  1 puff Inhalation Daily Danton Lauraine LABOR, PA-C   1 puff at 10/16/23 0910   valproic  acid (DEPAKENE ) 250 MG/5ML solution 750 mg  750 mg Oral BID Danton Lauraine LABOR, PA-C   750 mg at 10/15/23 2131   verapamil  (CALAN -SR) CR tablet 120 mg  120 mg Oral Daily Danton Lauraine A, PA-C   120 mg at 10/15/23 1454   voriconazole  (VFEND ) tablet 400 mg  400 mg Oral Q12H  Danton Lauraine LABOR, PA-C   400 mg at 10/15/23 2129     Discharge Medications: Please see discharge summary for a list of discharge medications.  Relevant Imaging Results:  Relevant Lab  Results:   Additional Information SS# [id number]; Outpatient Chemotherapy  Lauraine FORBES Saa, LCSW

## 2023-10-16 NOTE — Progress Notes (Signed)
 Occupational Therapy Treatment Patient Details Name: Frank [name]. MRN: [id number] DOB: [birth year] Today's Date: 10/16/2023   History of present illness 58 y.o. male transferred from [facility] 07/03/23 to [facility] for management of newly diagnosed plasma cell myeloma with weakness and AMS. Pt also with AKI and metabolic encephalopathy. 5/10 transfer to [facility] for iHD. 4/22 Intubated and chemo initiated. 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 5/6 IVIG started. 5/8-6/11 trach. 5/12 iHD initiated. 5/29 PEG placed. 6/5 chemo initiated. PMHx:Lt THA, carpal tunnel syndrome, Lt TKA, Lt humerus fx, PAF, HTN, HLD, bipolar disorder, obesity, T2DM   OT comments  STAR Program OT/PT session: patient received in supine and asking to use BSC. Patient instructed on bed mobility and max assist to get to EOB. Patient able to stand from EOB into Stedy with max assist +2 for transfer to [facility]. Patient was total assist for toilet hygiene while seated on BSC. Patient with increased difficulty with sit to stand from [facility] requiring total assist +2 with difficulty lowering Stedy pads and when standing to sit on EOB. Patient requiring total assist +2 to return to supine due to assistance with BLEs and trunk. Acute OT to continue to follow with STAR program to address established goals.       If plan is discharge home, recommend the following:  Two people to help with walking and/or transfers;Assistance with cooking/housework;Assistance with feeding;Direct supervision/assist for medications management;Direct supervision/assist for financial management;Assist for transportation;Help with stairs or ramp for entrance;A lot of help with bathing/dressing/bathroom   Equipment Recommendations  Other (comment) (TBD)    Recommendations for Other Services      Precautions / Restrictions Precautions Precautions: Fall Recall of Precautions/Restrictions: Impaired Restrictions Weight Bearing Restrictions Per Provider Order: Yes LUE Weight  Bearing Per Provider Order: Non weight bearing Other Position/Activity Restrictions: ROM to tolerance       Mobility Bed Mobility Overal bed mobility: Needs Assistance Bed Mobility: Supine to Sit, Sit to Supine Rolling: Max assist, Used rails   Supine to sit: Max assist, HOB elevated, Used rails Sit to supine: +2 for physical assistance, Total assist   General bed mobility comments: increased time and max assist to get to EOB, assistance with BLEs and trunk to return to supine    Transfers Overall transfer level: Needs assistance Equipment used: Ambulation equipment used Transfers: Sit to/from Stand, Bed to chair/wheelchair/BSC Sit to Stand: +2 physical assistance, Max assist, +2 safety/equipment, Total assist           General transfer comment: transfer to [facility] with max assist +2 for sit to stand from EOB and total assist +2 to stand from [facility] Transfer via Lift Equipment: Stedy   Balance Overall balance assessment: Needs assistance Sitting-balance support: Feet supported, No upper extremity supported Sitting balance-Leahy Scale: Fair Sitting balance - Comments: EOB   Standing balance support: Single extremity supported, During functional activity, Reliant on assistive device for balance Standing balance-Leahy Scale: Zero Standing balance comment: stood in stedy with difficulty to stand erect enough to use Stedy pads                           ADL either performed or assessed with clinical judgement   ADL Overall ADL's : Needs assistance/impaired                         Toilet Transfer: Maximal assistance;+2 for physical assistance;BSC/3in1 Laurent) Statistician Details (indicate cue type and  reason): transfer to [facility] with Stedy with max assist +2 to stand from EOB and total assist +2 to stand from [facility] Toileting- Clothing Manipulation and Hygiene: Total assistance;Sitting/lateral lean Toileting - Clothing Manipulation Details (indicate cue type and  reason): total assist for toilet hygiene while seated on [facility]            Extremity/Trunk Assessment              Vision       Perception     Praxis     Communication Communication Communication: Impaired Factors Affecting Communication: Reduced clarity of speech   Cognition Arousal: Alert Behavior During Therapy: WFL for tasks assessed/performed Cognition: Cognition impaired           Executive functioning impairment (select all impairments): Problem solving                   Following commands: Impaired Following commands impaired: Follows one step commands with increased time      Cueing   Cueing Techniques: Verbal cues, Tactile cues, Visual cues  Exercises      Shoulder Instructions       General Comments VSS on RA    Pertinent Vitals/ Pain       Pain Assessment Pain Assessment: Faces Faces Pain Scale: Hurts even more Pain Location: back, right knee, LUE surgical site Pain Descriptors / Indicators: Discomfort, Aching, Guarding Pain Intervention(s): Limited activity within patient's tolerance, Monitored during session, Repositioned  Home Living                                          Prior Functioning/Environment              Frequency  Min 1X/week (STAR Program)        Progress Toward Goals  OT Goals(current goals can now be found in the care plan section)  Progress towards OT goals: Progressing toward goals  Acute Rehab OT Goals Patient Stated Goal: to be able to transfer OT Goal Formulation: With patient Time For Goal Achievement: 10/29/23 Potential to Achieve Goals: Fair ADL Goals Pt Will Perform Grooming: sitting;with set-up (A/E PRN) Pt Will Perform Upper Body Dressing: sitting;with contact guard assist;with adaptive equipment Pt Will Perform Lower Body Dressing: sitting/lateral leans;sit to/from stand;with mod assist (using one handed RUE techniques) Pt Will Transfer to Toilet: with contact  guard assist;bedside commode;with transfer board Pt/caregiver will Perform Home Exercise Program: Increased ROM;Left upper extremity;With written HEP provided;Independently Additional ADL Goal #1: Pt will complete bed mobility with min A as a precursor to EOB ADLs Additional ADL Goal #2: Pt will demonstrate ability to stand statically for >3 mins to demonstrate improving tolerance in prep for OOB mobility Additional ADL Goal #3: Pt will demonstrate independence with sensory stimulation to LUE to improve phantom symptoms.  Plan      Co-evaluation    PT/OT/SLP Co-Evaluation/Treatment: Yes Reason for Co-Treatment: For patient/therapist safety;To address functional/ADL transfers PT goals addressed during session: Mobility/safety with mobility;Balance OT goals addressed during session: ADL's and self-care;Strengthening/ROM      AM-PAC OT 6 Clicks Daily Activity     Outcome Measure   Help from another person eating meals?: A Little Help from another person taking care of personal grooming?: A Little Help from another person toileting, which includes using toliet, bedpan, or urinal?: Total Help from another person bathing (including washing, rinsing, drying)?: A  Lot Help from another person to put on and taking off regular upper body clothing?: A Lot Help from another person to put on and taking off regular lower body clothing?: A Lot 6 Click Score: 13    End of Session Equipment Utilized During Treatment: Gait belt;Other (comment) Laurent)  OT Visit Diagnosis: Unsteadiness on feet (R26.81);Other abnormalities of gait and mobility (R26.89);Muscle weakness (generalized) (M62.81);Pain Pain - Right/Left: Left Pain - part of body: Shoulder   Activity Tolerance Patient tolerated treatment well   Patient Left in bed;with call bell/phone within reach;with family/visitor present   Nurse Communication Mobility status        Time: 8660-8578 OT Time Calculation (min): 42 min  Charges:  OT General Charges $OT Visit: 1 Visit OT Treatments $Self Care/Home Management : 23-37 mins  [name], OTA Acute Rehabilitation Services  Office [phone]   [name] 10/16/2023, 2:42 PM

## 2023-10-16 NOTE — Plan of Care (Signed)
  Problem: Education: Goal: Knowledge of General Education information will improve Description: Including pain rating scale, medication(s)/side effects and non-pharmacologic comfort measures Outcome: Progressing   Problem: Health Behavior/Discharge Planning: Goal: Ability to manage health-related needs will improve Outcome: Progressing   Problem: Clinical Measurements: Goal: Ability to maintain clinical measurements within normal limits will improve Outcome: Progressing Goal: Will remain free from infection Outcome: Progressing Goal: Diagnostic test results will improve Outcome: Progressing Goal: Respiratory complications will improve Outcome: Progressing Goal: Cardiovascular complication will be avoided Outcome: Progressing   Problem: Activity: Goal: Risk for activity intolerance will decrease Outcome: Progressing   Problem: Nutrition: Goal: Adequate nutrition will be maintained Outcome: Progressing   Problem: Coping: Goal: Level of anxiety will decrease Outcome: Progressing   Problem: Elimination: Goal: Will not experience complications related to bowel motility Outcome: Progressing Goal: Will not experience complications related to urinary retention Outcome: Progressing   Problem: Pain Managment: Goal: General experience of comfort will improve and/or be controlled Outcome: Progressing   Problem: Safety: Goal: Ability to remain free from injury will improve Outcome: Progressing   Problem: Skin Integrity: Goal: Risk for impaired skin integrity will decrease Outcome: Progressing   Problem: Education: Goal: Ability to describe self-care measures that may prevent or decrease complications (Diabetes Survival Skills Education) will improve Outcome: Progressing Goal: Individualized Educational Video(s) Outcome: Progressing   Problem: Coping: Goal: Ability to adjust to condition or change in health will improve Outcome: Progressing   Problem: Fluid  Volume: Goal: Ability to maintain a balanced intake and output will improve Outcome: Progressing   Problem: Health Behavior/Discharge Planning: Goal: Ability to identify and utilize available resources and services will improve Outcome: Progressing Goal: Ability to manage health-related needs will improve Outcome: Progressing   Problem: Metabolic: Goal: Ability to maintain appropriate glucose levels will improve Outcome: Progressing   Problem: Nutritional: Goal: Maintenance of adequate nutrition will improve Outcome: Progressing Goal: Progress toward achieving an optimal weight will improve Outcome: Progressing   Problem: Skin Integrity: Goal: Risk for impaired skin integrity will decrease Outcome: Progressing   Problem: Tissue Perfusion: Goal: Adequacy of tissue perfusion will improve Outcome: Progressing   Problem: Education: Goal: Knowledge of General Education information will improve Description: Including pain rating scale, medication(s)/side effects and non-pharmacologic comfort measures Outcome: Progressing   Problem: Health Behavior/Discharge Planning: Goal: Ability to manage health-related needs will improve Outcome: Progressing   Problem: Clinical Measurements: Goal: Ability to maintain clinical measurements within normal limits will improve Outcome: Progressing Goal: Will remain free from infection Outcome: Progressing Goal: Diagnostic test results will improve Outcome: Progressing Goal: Respiratory complications will improve Outcome: Progressing Goal: Cardiovascular complication will be avoided Outcome: Progressing   Problem: Activity: Goal: Risk for activity intolerance will decrease Outcome: Progressing   Problem: Nutrition: Goal: Adequate nutrition will be maintained Outcome: Progressing   Problem: Coping: Goal: Level of anxiety will decrease Outcome: Progressing   Problem: Elimination: Goal: Will not experience complications related to  bowel motility Outcome: Progressing Goal: Will not experience complications related to urinary retention Outcome: Progressing   Problem: Pain Managment: Goal: General experience of comfort will improve and/or be controlled Outcome: Progressing   Problem: Safety: Goal: Ability to remain free from injury will improve Outcome: Progressing   Problem: Skin Integrity: Goal: Risk for impaired skin integrity will decrease Outcome: Progressing

## 2023-10-16 NOTE — Plan of Care (Signed)
 Problem: Education: Goal: Knowledge of General Education information will improve Description: Including pain rating scale, medication(s)/side effects and non-pharmacologic comfort measures Outcome: Progressing   Problem: Health Behavior/Discharge Planning: Goal: Ability to manage health-related needs will improve Outcome: Progressing   Problem: Clinical Measurements: Goal: Ability to maintain clinical measurements within normal limits will improve Outcome: Progressing Goal: Will remain free from infection Outcome: Progressing Goal: Diagnostic test results will improve Outcome: Progressing Goal: Respiratory complications will improve Outcome: Progressing Goal: Cardiovascular complication will be avoided Outcome: Progressing   Problem: Activity: Goal: Risk for activity intolerance will decrease Outcome: Progressing   Problem: Nutrition: Goal: Adequate nutrition will be maintained Outcome: Progressing   Problem: Coping: Goal: Level of anxiety will decrease Outcome: Progressing   Problem: Elimination: Goal: Will not experience complications related to bowel motility Outcome: Progressing Goal: Will not experience complications related to urinary retention Outcome: Progressing   Problem: Pain Managment: Goal: General experience of comfort will improve and/or be controlled Outcome: Progressing   Problem: Safety: Goal: Ability to remain free from injury will improve Outcome: Progressing   Problem: Skin Integrity: Goal: Risk for impaired skin integrity will decrease Outcome: Progressing   Problem: Education: Goal: Ability to describe self-care measures that may prevent or decrease complications (Diabetes Survival Skills Education) will improve Outcome: Progressing Goal: Individualized Educational Video(s) Outcome: Progressing   Problem: Coping: Goal: Ability to adjust to condition or change in health will improve Outcome: Progressing   Problem: Fluid  Volume: Goal: Ability to maintain a balanced intake and output will improve Outcome: Progressing   Problem: Health Behavior/Discharge Planning: Goal: Ability to identify and utilize available resources and services will improve Outcome: Progressing Goal: Ability to manage health-related needs will improve Outcome: Progressing   Problem: Metabolic: Goal: Ability to maintain appropriate glucose levels will improve Outcome: Progressing   Problem: Nutritional: Goal: Maintenance of adequate nutrition will improve Outcome: Progressing Goal: Progress toward achieving an optimal weight will improve Outcome: Progressing   Problem: Skin Integrity: Goal: Risk for impaired skin integrity will decrease Outcome: Progressing   Problem: Tissue Perfusion: Goal: Adequacy of tissue perfusion will improve Outcome: Progressing   Problem: Education: Goal: Knowledge about tracheostomy care/management will improve Outcome: Progressing   Problem: Activity: Goal: Ability to tolerate increased activity will improve Outcome: Progressing   Problem: Health Behavior/Discharge Planning: Goal: Ability to manage tracheostomy will improve Outcome: Progressing   Problem: Education: Goal: Knowledge of General Education information will improve Description: Including pain rating scale, medication(s)/side effects and non-pharmacologic comfort measures Outcome: Progressing   Problem: Health Behavior/Discharge Planning: Goal: Ability to manage health-related needs will improve Outcome: Progressing   Problem: Clinical Measurements: Goal: Ability to maintain clinical measurements within normal limits will improve Outcome: Progressing Goal: Will remain free from infection Outcome: Progressing Goal: Diagnostic test results will improve Outcome: Progressing Goal: Respiratory complications will improve Outcome: Progressing Goal: Cardiovascular complication will be avoided Outcome: Progressing    Problem: Activity: Goal: Risk for activity intolerance will decrease Outcome: Progressing   Problem: Nutrition: Goal: Adequate nutrition will be maintained Outcome: Progressing   Problem: Coping: Goal: Level of anxiety will decrease Outcome: Progressing   Problem: Elimination: Goal: Will not experience complications related to bowel motility Outcome: Progressing Goal: Will not experience complications related to urinary retention Outcome: Progressing   Problem: Pain Managment: Goal: General experience of comfort will improve and/or be controlled Outcome: Progressing   Problem: Safety: Goal: Ability to remain free from injury will improve Outcome: Progressing   Problem: Skin Integrity: Goal: Risk for impaired  skin integrity will decrease Outcome: Progressing

## 2023-10-16 NOTE — TOC Progression Note (Signed)
 Transition of Care (TOC) - Progression Note    Patient Details  Name: Frank [name]. MRN: [id number] Date of Birth: [birth year]  Transition of Care [facility]) CM/SW Contact  Lauraine FORBES Saa, LCSW Phone Number: 10/16/2023, 10:23 AM  Clinical Narrative:     10:23 AM CSW sent updated FL2 to SNFs in [place] and [place] in efforts to obtain a SNF bed offer. Patient is expected to receive outpatient chemotherapy in [facility]. CSW will continue to follow and be available to assist.  Expected Discharge Plan: Skilled Nursing Facility Barriers to Discharge: Awaiting State Approval [name], SNF Pending bed offer, Continued Medical Work up, Air traffic controller and Services In-house Referral: Clinical Social Work Discharge Planning Services: [employer] Consult Post Acute Care Choice: Skilled Nursing Facility Living arrangements for the past 2 months: Single Family Home                 DME Arranged: N/A DME Agency: NA       HH Arranged: NA HH Agency: NA         Social Drivers of Health (SDOH) Interventions SDOH Screenings   Food Insecurity: No Food Insecurity (07/05/2023)  Housing: Low Risk  (07/05/2023)  Transportation Needs: No Transportation Needs (07/05/2023)  Utilities: Not At Risk (07/05/2023)  Tobacco Use: High Risk (10/13/2023)    Readmission Risk Interventions     No data to display

## 2023-10-16 NOTE — Progress Notes (Signed)
 [facility]                                                                              Frank [name], is a 58 y.o. male, DOB - 1965-05-27, FMW:[id number] Admit date - 07/03/2023    Outpatient Primary MD for the patient is [name], MD  LOS - 105  days  No chief complaint on file.      Brief summary   58 year old PMH OSA CPAP, pathological fracture and multiple ORIF left Humerus likely due to multiple myeloma, A-fib not on anticoagulation, hyperlipidemia, tobacco use disorder who was feeling weak for 1 week, admitted to [facility] health with concern of sepsis, community-acquired pneumonia and acute encephalopathy. Patient was found to have lytic lesions, he was transferred to [facility] for oncology treatment. In the meantime he developed encephalopathy, acute renal failure. He remained in the ICU for 36 days. Underwent tracheostomy. He was also started on HD after CRRT,off of HD now. Subsequently PEG tube placed 5/29 and IR drain placement of right thigh hip infected hematoma on 7/11 . S/p PEG tube removal. Needs Port-A-Cath placement (for outpatient chemo), either during this hospitalization or as an outpatient. F/u IR recommendations for drain management. Started on heparin  drip on 7/17 because of b/l LE DVT.  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 on 7/28.  Surgical EBL 750 cc. Oncology and IR following.   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 - continue pain management, currently controlled. - Continue wound VAC, orthopedics to follow   Infected right flank/gluteal hematoma -Underwent IR drain placement for infected Hematoma on 7/11.  -Drain exchanged on 7/25. -Completed antibiotic course on 7/26, cultures negative  -IR following.   Sepsis in the setting of Staph Epi  Bacteremia  -blood culture with MRSE in 1 out of 4 bottles.  - Completed a course of vancomycin , 6/20, received a line holiday.   - Repeat blood culture 6/9 without growth.  Completed zyvox  on 7/26   Multiple myeloma:  - Started chemo with Velcade /Cytoxan  on 08/21/2023.  Chemo changed to Sarclisa /Kyprolis /Cytoxan  later on.  Currently on hold. -Myeloma responded well to treatment, IgG decreased from [phone] (normal range). -Oncology, Dr [name] following and planning to resume treatment  - Port-A-Cath placed on 7/30 by IR   AKI on CKD-3A/uremia:  - due to MM, hypercalcemia and ACE inhibitors?  Briefly required hemodialysis.   - AKI resolved and TDC removed.  Cr started to trend up again likely for multiple myeloma. - Cr stabilizing   BLE DVT:  - US  venous doppler on 7/16 positive for DVT in his RLE PTV, left CFV, SFJ, and PFV. - Now on Eliquis .   Left sided pleuritic chest wall pain:   - No rib fractures seen on CXR - Analgesics as needed.   Aspergillosis with pneumonia  -Continue voriconazole .  End date on 8/5.   Acute Hypoxic Respiratory Failure-trach removed 6/11.  Decannulated.   Severe acute  metabolic encephalopathy with hypoactive delirium, seizure disorder - Normal MRI of the brain.  LP negative for meningitis or encephalitis.  EEG negative for seizures but encephalopathy.  -Resolved   Acute postoperative blood loss anemia superimposed on anemia of renal disease:  - H/H stable    IDDM-2 with hyperglycemia - Hb A1c 6.0%. -Continue with sliding scale insulin .   CBG (last 3)  Recent Labs    10/15/23 1654 10/15/23 1937 10/16/23 0807  GLUCAP 183* 133* 159*      Paroxysmal A-fib:: Rate controlled. -Continue verapamil  -Eliquis  for anticoagulation.   Essential hypertension: Normotensive. - Continue verapamil    Obstructive Sleep Apnea  - Trach removed. CPAP    Anxiety/Bipolar disorder  -  Continue Paxil .     Hypokalemia/hyperkalemia: K5.8. - Potassium  stable   Hyponatremia:  - Obtain serum osmolarity, urine osmolarity, UNA for further workup -Continue to monitor   Dysphagia:  - Resolved.  PEG tube removed.  On regular diet.    RN Pressure Injury Documentation: Pressure Injury 07/05/23 Heel Left;Right Unstageable - Full thickness tissue loss in which the base of the injury is covered by slough (yellow, tan, gray, green or brown) and/or eschar (tan, brown or black) in the wound bed. (Active)  07/05/23 1108  Location: Heel  Location Orientation: Left;Right  Staging: Unstageable - Full thickness tissue loss in which the base of the injury is covered by slough (yellow, tan, gray, green or brown) and/or eschar (tan, brown or black) in the wound bed.  Wound Description (Comments):   DO NOT USE:  Present on Admission: Yes  Dressing Type None 10/12/23 1935     Pressure Injury 07/11/23 Sacrum Mid Unstageable - Full thickness tissue loss in which the base of the injury is covered by slough (yellow, tan, gray, green or brown) and/or eschar (tan, brown or black) in the wound bed. (Active)  07/11/23 1600  Location: Sacrum  Location Orientation: Mid  Staging: Unstageable - Full thickness tissue loss in which the base of the injury is covered by slough (yellow, tan, gray, green or brown) and/or eschar (tan, brown or black) in the wound bed.  Wound Description (Comments):   DO NOT USE:  Present on Admission: No  Dressing Type Gauze (Comment);Santyl ;Barrier Film (skin prep);Foam - Lift dressing to assess site every shift 10/07/23 1400     Pressure Injury 07/27/23 Buttocks Right;Lower (Active)  07/27/23 1700  Location: Buttocks  Location Orientation: Right;Lower  Staging:   Wound Description (Comments):   DO NOT USE:  Present on Admission: Yes  Dressing Type None 10/13/23 0515     Pressure Injury 08/25/23 Buttocks Left Stage 2 -  Partial thickness loss of dermis presenting as a shallow open injury with a red, pink wound bed without slough.  (Active)  08/25/23   Location: Buttocks  Location Orientation: Left  Staging: Stage 2 -  Partial thickness loss of dermis presenting as a shallow open injury with a red, pink wound bed without slough.  Wound Description (Comments):   DO NOT USE:  Present on Admission: No  Dressing Type None 10/13/23 0515    Severe protein calorie malnutrition  Nutrition Problem: Severe Malnutrition Etiology: acute illness (prolonged illness, interuptions in feeding) Signs/Symptoms: moderate fat depletion, moderate muscle depletion, percent weight loss (9.6% x 1 month) Percent weight loss: 9.6 % Interventions: Ensure Enlive (each supplement provides 350kcal and 20 grams of protein), MVI, Liberalize Diet, Prostat, Juven  Obesity class I Estimated body mass index is 31.99 kg/m as calculated from the  following:   Height as of this encounter: 6' (1.829 m).   Weight as of this encounter: 107 kg.  Code Status: full  DVT Prophylaxis:  SCDs Start: 10/13/23 1123 Place and maintain sequential compression device Start: 07/14/23 1033 apixaban  (ELIQUIS ) tablet 5 mg   Level of Care: Level of care: Telemetry Medical Family Communication:  Disposition Plan:      Remains inpatient appropriate:      Procedures:    Consultants:     Antimicrobials:   Anti-infectives (From admission, onward)    Start     Dose/Rate Route Frequency Ordered Stop   10/13/23 1400  ceFAZolin  (ANCEF ) IVPB 2g/100 mL premix        2 g 200 mL/hr over 30 Minutes Intravenous Every 8 hours 10/13/23 1254 10/14/23 0618   10/13/23 0937  vancomycin  (VANCOCIN ) powder  Status:  Discontinued          As needed 10/13/23 0937 10/13/23 1002   10/10/23 0900  doxycycline  200mg  in 20 mL SWI irrigation / sclerosing solution for IR       Note to Pharmacy: For irrigation only. WARNING - SCLEROSING AGENT  #5 vials sent to IR for procedure   500 mg Irrigation To Radiology 10/07/23 1418 10/10/23 1015   10/10/23 0000  doxycycline  (VIBRAMYCIN ) 500 mg  in dextrose  5 % 250 mL IVPB  Status:  Discontinued        500 mg 125 mL/hr over 120 Minutes Intravenous To Radiology 10/07/23 1402 10/07/23 1409   10/10/23 0000  doxycycline  200mg  in 20 mL SWI irrigation / sclerosing solution for IR  Status:  Discontinued        200 mg Irrigation To Radiology 10/07/23 1418 10/07/23 1456   10/10/23 0000  doxycycline  200mg  in 20 mL SWI irrigation / sclerosing solution for IR  Status:  Discontinued        200 mg Irrigation To Radiology 10/07/23 1440 10/07/23 1456   10/02/23 2200  linezolid  (ZYVOX ) tablet 600 mg        600 mg Oral Every 12 hours 10/01/23 0852 10/09/23 2108   10/02/23 1000  linezolid  (ZYVOX ) tablet 600 mg  Status:  Discontinued        600 mg Oral Every 12 hours 10/01/23 0723 10/01/23 0852   09/30/23 1300  vancomycin  variable dose per unstable renal function (pharmacist dosing)  Status:  Discontinued         Does not apply See admin instructions 09/30/23 1301 10/01/23 0725   09/29/23 1145  amoxicillin -clavulanate (AUGMENTIN ) 875-125 MG per tablet 1 tablet        1 tablet Oral Every 12 hours 09/29/23 1047 10/09/23 2108   09/27/23 0200  vancomycin  (VANCOREADY) IVPB 1500 mg/300 mL        1,500 mg 150 mL/hr over 120 Minutes Intravenous Every 12 hours 09/26/23 1402 09/30/23 1448   09/26/23 1500  cefTRIAXone  (ROCEPHIN ) 2 g in sodium chloride  0.9 % 100 mL IVPB  Status:  Discontinued        2 g 200 mL/hr over 30 Minutes Intravenous Every 24 hours 09/26/23 1402 09/29/23 1047   09/26/23 1315  vancomycin  (VANCOREADY) IVPB 2000 mg/400 mL        2,000 mg 200 mL/hr over 120 Minutes Intravenous  Once 09/26/23 1220 09/26/23 1656   09/26/23 1230  metroNIDAZOLE  (FLAGYL ) IVPB 500 mg  Status:  Discontinued        500 mg 100 mL/hr over 60 Minutes Intravenous Every 12 hours 09/26/23 1137 09/29/23 1138  09/22/23 2200  voriconazole  (VFEND ) tablet 400 mg        400 mg Oral Every 12 hours 09/22/23 1521 10/21/23 2359   09/11/23 2200  acyclovir  (ZOVIRAX ) 200 MG capsule  400 mg       Note to Pharmacy: Give after dialysis the pm dose, on dialysis days   400 mg Oral 2 times daily 09/11/23 1551     09/11/23 2200  voriconazole  (VFEND ) tablet 350 mg  Status:  Discontinued        350 mg Oral Every 12 hours 09/11/23 1551 09/22/23 1521   09/07/23 1000  acyclovir  (ZOVIRAX ) 200 MG capsule 400 mg  Status:  Discontinued       Note to Pharmacy: Give after dialysis the pm dose, on dialysis days   400 mg Per Tube 2 times daily 09/07/23 0825 09/11/23 1551   09/03/23 1200  vancomycin  (VANCOCIN ) IVPB 1000 mg/200 mL premix  Status:  Discontinued        1,000 mg 200 mL/hr over 60 Minutes Intravenous Every M-W-F (Hemodialysis) 09/02/23 1021 09/03/23 0942   09/03/23 0942  vancomycin  variable dose per unstable renal function (pharmacist dosing)  Status:  Discontinued         Does not apply See admin instructions 09/03/23 0942 09/03/23 0945   09/02/23 0915  vancomycin  (VANCOCIN ) IVPB 1000 mg/200 mL premix        1,000 mg 200 mL/hr over 60 Minutes Intravenous  Once 09/02/23 0830 09/02/23 1215   09/01/23 0834  ceFAZolin  (ANCEF ) IVPB 2g/100 mL premix        over 30 Minutes Intravenous Continuous PRN 09/01/23 0834 09/01/23 0834   08/29/23 0914  vancomycin  variable dose per unstable renal function (pharmacist dosing)  Status:  Discontinued         Does not apply See admin instructions 08/29/23 0914 09/02/23 1021   08/27/23 1400  vancomycin  (VANCOCIN ) IVPB 1000 mg/200 mL premix        1,000 mg 200 mL/hr over 60 Minutes Intravenous  Once 08/27/23 0920 08/27/23 1653   08/25/23 1200  vancomycin  (VANCOCIN ) IVPB 1000 mg/200 mL premix        1,000 mg 200 mL/hr over 60 Minutes Intravenous Every M-W-F (Hemodialysis) 08/24/23 1405 08/29/23 1240   08/24/23 1445  vancomycin  (VANCOREADY) IVPB 2000 mg/400 mL        2,000 mg 200 mL/hr over 120 Minutes Intravenous  Once 08/24/23 1353 08/25/23 0323   08/14/23 0600  ceFAZolin  (ANCEF ) IVPB 2g/100 mL premix        2 g 200 mL/hr over 30 Minutes  Intravenous To Radiology 08/13/23 1055 08/14/23 0606   08/09/23 1000  acyclovir  (ZOVIRAX ) 200 MG capsule 200 mg  Status:  Discontinued       Note to Pharmacy: Give after dialysis the pm dose, on dialysis days   200 mg Per Tube 2 times daily 08/08/23 2358 09/07/23 0825   08/09/23 0100  acyclovir  (ZOVIRAX ) 200 MG capsule 200 mg       Note to Pharmacy: Give after dialysis the pm dose, on dialysis days   200 mg Per Tube  Once 08/09/23 0009 08/09/23 0013   08/08/23 2345  acyclovir  (ZOVIRAX ) 200 MG capsule 200 mg  Status:  Discontinued       Note to Pharmacy: Give after dialysis the pm dose, on dialysis days   200 mg Oral 2 times daily 08/08/23 2257 08/08/23 2358   08/07/23 1530  ceFAZolin  (ANCEF ) IVPB 2g/100 mL premix  over 30 Minutes Intravenous Continuous PRN 08/07/23 1555 08/07/23 1530   08/05/23 2200  voriconazole  (VFEND ) tablet 350 mg  Status:  Discontinued        350 mg Per Tube Every 12 hours 08/05/23 1511 09/11/23 1551   08/01/23 1200  vancomycin  (VANCOCIN ) IVPB 1000 mg/200 mL premix        1,000 mg 200 mL/hr over 60 Minutes Intravenous Every M-W-F (Hemodialysis) 07/31/23 0649 08/01/23 1652   07/30/23 1200  vancomycin  (VANCOCIN ) IVPB 1000 mg/200 mL premix        1,000 mg 200 mL/hr over 60 Minutes Intravenous Every M-W-F (Hemodialysis) 07/30/23 0851 07/30/23 1241   07/29/23 0900  vancomycin  (VANCOCIN ) IVPB 1000 mg/200 mL premix        1,000 mg 200 mL/hr over 60 Minutes Intravenous  Once 07/29/23 0812 07/29/23 0943   07/28/23 1204  vancomycin  variable dose per unstable renal function (pharmacist dosing)  Status:  Discontinued         Does not apply See admin instructions 07/28/23 1204 07/31/23 0649   07/28/23 1000  voriconazole  (VFEND ) 350 mg in sodium chloride  0.9 % 100 mL IVPB  Status:  Discontinued       Placed in Followed by Linked Group   4 mg/kg  88.6 kg (Adjusted) 67.5 mL/hr over 120 Minutes Intravenous Every 12 hours 07/28/23 0748 08/05/23 1511   07/27/23 1000   voriconazole  (VFEND ) 400 mg in sodium chloride  0.9 % 100 mL IVPB  Status:  Discontinued       Placed in Followed by Linked Group   4 mg/kg  100.6 kg 70 mL/hr over 120 Minutes Intravenous Every 12 hours 07/26/23 0826 07/28/23 0748   07/27/23 0945  vancomycin  (VANCOREADY) IVPB 2000 mg/400 mL        2,000 mg 200 mL/hr over 120 Minutes Intravenous  Once 07/27/23 0849 07/27/23 1237   07/26/23 1515  Ampicillin -Sulbactam (UNASYN ) 3 g in sodium chloride  0.9 % 100 mL IVPB  Status:  Discontinued        3 g 200 mL/hr over 30 Minutes Intravenous Every 8 hours 07/26/23 1418 07/27/23 0830   07/26/23 0915  voriconazole  (VFEND ) 600 mg in sodium chloride  0.9 % 150 mL IVPB       Placed in Followed by Linked Group   6 mg/kg  100.6 kg 105 mL/hr over 120 Minutes Intravenous Every 12 hours 07/26/23 0826 07/27/23 0048   07/26/23 0900  Ampicillin -Sulbactam (UNASYN ) 3 g in sodium chloride  0.9 % 100 mL IVPB  Status:  Discontinued        3 g 200 mL/hr over 30 Minutes Intravenous Every 12 hours 07/26/23 0826 07/26/23 1418   07/23/23 1000  levofloxacin  (LEVAQUIN ) IVPB 500 mg  Status:  Discontinued       Placed in Followed by Linked Group   500 mg 100 mL/hr over 60 Minutes Intravenous Every 24 hours 07/22/23 1213 07/23/23 1001   07/23/23 1000  levofloxacin  (LEVAQUIN ) IVPB 500 mg  Status:  Discontinued       Placed in Followed by Linked Group   500 mg 100 mL/hr over 60 Minutes Intravenous Every 24 hours 07/23/23 1001 07/28/23 0804   07/22/23 1300  levofloxacin  (LEVAQUIN ) IVPB 750 mg       Placed in Followed by Linked Group   750 mg 100 mL/hr over 90 Minutes Intravenous  Once 07/22/23 1213 07/22/23 1542   07/21/23 1600  voriconazole  (VFEND ) 300 mg in sodium chloride  0.9 % 100 mL IVPB  Status:  Discontinued  300 mg 65 mL/hr over 120 Minutes Intravenous Every 12 hours 07/21/23 1430 07/22/23 1415   07/21/23 1000  voriconazole  (VFEND ) tablet 200 mg  Status:  Discontinued        200 mg Oral Every 12  hours 07/20/23 0944 07/21/23 1015   07/20/23 1030  voriconazole  (VFEND ) 530 mg in sodium chloride  0.9 % 150 mL IVPB        6 mg/kg  88.6 kg (Adjusted) 101.5 mL/hr over 120 Minutes Intravenous Every 12 hours 07/20/23 0944 07/21/23 0030   07/19/23 1400  meropenem  (MERREM ) 1 g in sodium chloride  0.9 % 100 mL IVPB  Status:  Discontinued        1 g 200 mL/hr over 30 Minutes Intravenous Every 8 hours 07/19/23 1143 07/22/23 1213   07/18/23 0600  meropenem  (MERREM ) 1 g in sodium chloride  0.9 % 100 mL IVPB  Status:  Discontinued        1 g 200 mL/hr over 30 Minutes Intravenous Every 24 hours 07/17/23 0916 07/19/23 1143   07/17/23 0700  meropenem  (MERREM ) 1 g in sodium chloride  0.9 % 100 mL IVPB  Status:  Discontinued        1 g 200 mL/hr over 30 Minutes Intravenous Every 12 hours 07/17/23 0601 07/17/23 0916   07/15/23 1300  vancomycin  (VANCOCIN ) IVPB 1000 mg/200 mL premix  Status:  Discontinued        1,000 mg 200 mL/hr over 60 Minutes Intravenous Every 24 hours 07/14/23 1129 07/22/23 1319   07/14/23 1215  vancomycin  (VANCOREADY) IVPB 1750 mg/350 mL        1,750 mg 175 mL/hr over 120 Minutes Intravenous  Once 07/14/23 1129 07/14/23 1449   07/09/23 1800  piperacillin -tazobactam (ZOSYN ) IVPB 3.375 g  Status:  Discontinued        3.375 g 12.5 mL/hr over 240 Minutes Intravenous Every 12 hours 07/09/23 0756 07/09/23 0844   07/09/23 1400  piperacillin -tazobactam (ZOSYN ) IVPB 3.375 g  Status:  Discontinued        3.375 g 12.5 mL/hr over 240 Minutes Intravenous Every 8 hours 07/09/23 0844 07/17/23 0557   07/08/23 1300  piperacillin -tazobactam (ZOSYN ) IVPB 3.375 g  Status:  Discontinued        3.375 g 12.5 mL/hr over 240 Minutes Intravenous Every 8 hours 07/08/23 1129 07/09/23 0756   07/05/23 2200  azithromycin  (ZITHROMAX ) 500 mg in sodium chloride  0.9 % 250 mL IVPB        500 mg 250 mL/hr over 60 Minutes Intravenous Daily at bedtime 07/05/23 2022 07/06/23 2302   07/05/23 2115  azithromycin  (ZITHROMAX )  250 mg in dextrose  5 % 125 mL IVPB  Status:  Discontinued       Note to Pharmacy: Missed dose this morning   250 mg 127.5 mL/hr over 60 Minutes Intravenous Every 24 hours 07/05/23 2016 07/05/23 2021   07/04/23 0900  fluconazole  (DIFLUCAN ) IVPB 200 mg  Status:  Discontinued        200 mg 100 mL/hr over 60 Minutes Intravenous Every 48 hours 07/04/23 0722 07/09/23 0809   07/03/23 0600  cefTRIAXone  (ROCEPHIN ) 1 g in sodium chloride  0.9 % 100 mL IVPB  Status:  Discontinued        1 g 200 mL/hr over 30 Minutes Intravenous Every 24 hours 07/03/23 0508 07/08/23 1104   07/03/23 0600  azithromycin  (ZITHROMAX ) tablet 250 mg  Status:  Discontinued        250 mg Oral Daily 07/03/23 0508 07/05/23 2016  Medications  (feeding supplement) PROSource Plus  30 mL Oral TID BM   acyclovir   400 mg Oral BID   apixaban   5 mg Oral BID   collagenase    Topical Daily   docusate sodium   100 mg Oral BID   epoetin  alfa  40,000 Units Subcutaneous Q Wed-1800   feeding supplement  237 mL Oral BID WC   guaiFENesin   600 mg Oral BID   heparin  lock flush  500 Units Intravenous Once   hydrALAZINE   25 mg Oral Q8H   insulin  aspart  0-15 Units Subcutaneous TID AC & HS   insulin  aspart  2 Units Subcutaneous TID AC & HS   insulin  glargine-yfgn  10 Units Subcutaneous Q24H   lidocaine   1 patch Transdermal Q24H   montelukast   5 mg Oral QHS   multivitamin with minerals  1 tablet Oral Daily   nutrition supplement (JUVEN)  1 packet Oral BID AC   mouth rinse  15 mL Mouth Rinse 4 times per day   pantoprazole   40 mg Oral BID   PARoxetine   40 mg Oral Daily   polyethylene glycol  17 g Oral BID   senna  1 tablet Oral Daily   sodium chloride  flush  10-40 mL Intracatheter Q12H   umeclidinium-vilanterol  1 puff Inhalation Daily   valproic  acid  750 mg Oral BID   verapamil   120 mg Oral Daily   voriconazole   400 mg Oral Q12H      Subjective:   Frank [name] was seen and examined today.  Pain in the left upper arm  controlled, wound VAC on.  Port-A-Cath placed.  Patient denies dizziness, chest pain, shortness of breath, abdominal pain, N/V.   Objective:   Vitals:   10/15/23 1936 10/16/23 0412 10/16/23 0806 10/16/23 0910  BP: (!) 126/103 (!) 133/101 121/89   Pulse: (!) 107 (!) 101 98 98  Resp: 18 16  18   Temp: 97.7 F (36.5 C) (!) 97.4 F (36.3 C) 98 F (36.7 C)   TempSrc: Oral Oral    SpO2: 100% 95% 94% 95%  Weight:  107 kg    Height:        Intake/Output Summary (Last 24 hours) at 10/16/2023 1136 Last data filed at 10/16/2023 0600 Gross per 24 hour  Intake 5 ml  Output 2700 ml  Net -2695 ml     Wt Readings from Last 3 Encounters:  10/16/23 107 kg  06/05/23 113.7 kg  01/29/23 117.9 kg   Physical Exam General: Alert and oriented x 3, NAD Cardiovascular: S1 S2 clear, RRR.  Respiratory: CTAB Gastrointestinal: Soft, nontender, nondistended, NBS Ext: no pedal edema bilaterally.  Left arm disarticulation, dressing intact with wound VAC Neuro: no new deficits Psych: Normal affect       Data Reviewed:  I have personally reviewed following labs    CBC Lab Results  Component Value Date   WBC 7.7 10/16/2023   RBC 2.91 (L) 10/16/2023   HGB 9.4 (L) 10/16/2023   HCT 28.9 (L) 10/16/2023   MCV 99.3 10/16/2023   MCH 32.3 10/16/2023   PLT 291 10/16/2023   MCHC 32.5 10/16/2023   RDW 20.5 (H) 10/16/2023   LYMPHSABS 1.2 10/06/2023   MONOABS 0.9 10/06/2023   EOSABS 0.1 10/06/2023   BASOSABS 0.0 10/06/2023     Last metabolic panel Lab Results  Component Value Date   NA 128 (L) 10/16/2023   K 4.5 10/16/2023   CL 96 (L) 10/16/2023   CO2 21 (L) 10/16/2023  BUN 38 (H) 10/16/2023   CREATININE 1.42 (H) 10/16/2023   GLUCOSE 126 (H) 10/16/2023   GFRNONAA 57 (L) 10/16/2023   GFRAA >60 12/27/2017   CALCIUM  8.2 (L) 10/16/2023   PHOS 4.7 (H) 10/16/2023   PROT 5.5 (L) 10/07/2023   ALBUMIN  2.3 (L) 10/16/2023   LABGLOB 2.6 09/12/2023   AGRATIO 0.9 09/12/2023   BILITOT 0.5  10/07/2023   ALKPHOS 83 10/07/2023   AST 9 (L) 10/07/2023   ALT 5 10/07/2023   ANIONGAP 11 10/16/2023    CBG (last 3)  Recent Labs    10/15/23 1654 10/15/23 1937 10/16/23 0807  GLUCAP 183* 133* 159*      Coagulation Profile: No results for input(s): INR, PROTIME in the last 168 hours.   Radiology Studies: I have personally reviewed the imaging studies  IR IMAGING GUIDED PORT INSERTION Result Date: 10/15/2023 INDICATION: 59 year old male with history multiple myeloma requiring central venous access for chemotherapy administration EXAM: IMPLANTED PORT A CATH PLACEMENT WITH ULTRASOUND AND FLUOROSCOPIC GUIDANCE COMPARISON:  None Available. MEDICATIONS: None. ANESTHESIA/SEDATION: Moderate (conscious) sedation was employed during this procedure. A total of Versed  1.5 mg and Fentanyl  75 mcg was administered intravenously. Moderate Sedation Time: 14 minutes. The patient's level of consciousness and vital signs were monitored continuously by radiology nursing throughout the procedure under my direct supervision. CONTRAST:  None FLUOROSCOPY TIME:  Ten mGy reference air kerma COMPLICATIONS: None immediate. PROCEDURE: The procedure, risks, benefits, and alternatives were explained to the patient. Questions regarding the procedure were encouraged and answered. The patient understands and consents to the procedure. The right neck and chest were prepped with chlorhexidine  in a sterile fashion, and a sterile drape was applied covering the operative field. Maximum barrier sterile technique with sterile gowns and gloves were used for the procedure. A timeout was performed prior to the initiation of the procedure. Ultrasound was used to examine the jugular vein which was compressible and free of internal echoes. A skin marker was used to demarcate the planned venotomy and port pocket incision sites. Local anesthesia was provided to these sites and the subcutaneous tunnel track with 1% lidocaine  with  1:100,000 epinephrine . A small incision was created at the jugular access site and blunt dissection was performed of the subcutaneous tissues. Under ultrasound guidance, the jugular vein was accessed with a 21 ga micropuncture needle and an 0.018 wire was inserted to the superior vena cava. Real-time ultrasound guidance was utilized for vascular access including the acquisition of a permanent ultrasound image documenting patency of the accessed vessel. A 5 Fr micopuncture set was then used, through which a 0.035 Rosen wire was passed under fluoroscopic guidance into the inferior vena cava. An 8 Fr dilator was then placed over the wire. A subcutaneous port pocket was then created along the upper chest wall utilizing a combination of sharp and blunt dissection. The pocket was irrigated with sterile saline, packed with gauze, and observed for hemorrhage. A single lumen ISP sized power injectable port was chosen for placement. The 8 Fr catheter was tunneled from the port pocket site to the venotomy incision. The port was placed in the pocket. The external catheter was trimmed to appropriate length. The dilator was exchanged for an 8 Fr peel-away sheath under fluoroscopic guidance. The catheter was then placed through the sheath and the sheath was removed. Final catheter positioning was confirmed and documented with a fluoroscopic spot radiograph. The port was accessed with a Huber needle, aspirated, and flushed with heparinized saline. The deep dermal layer  of the port pocket incision was closed with interrupted 3-0 Vicryl suture. Dermabond was then placed over the port pocket and neck incisions. The patient tolerated the procedure well without immediate post procedural complication. FINDINGS: After catheter placement, the tip lies within the superior cavoatrial junction. The catheter aspirates and flushes normally and is ready for immediate use. IMPRESSION: Successful placement of a power injectable Port-A-Cath via  the right internal jugular vein. The catheter is ready for immediate use. Ester Sides, MD [facility] Radiology Electronically Signed   By: Ester Sides M.D.   On: 10/15/2023 16:30       Bunnie Rehberg M.D. [facility] 10/16/2023, 11:36 AM  Available via Epic secure chat 7am-7pm After 7 pm, please refer to night coverage provider listed on amion.

## 2023-10-16 NOTE — Progress Notes (Signed)
 Chief Complaint: Patient was seen in consultation today for right hip fluid collection s/p percutaneous drain exchange and sclerotherapy with on 7/25   Procedure: Sclerotherapy of Fluid Collection  Referring Physician(s): Montano,Emil  Supervising Physician: Jenna Hacker  Patient Status: [facility] - In-pt  History of Present Illness: Frank [name]. is a 58 y.o. male with a history of OSA, uses CPAP, multiple myeloma, A-fib on Eliquis , and tobacco use disorder. Patient has had a complicated hospital stay with sepsis, pneumonia, encephalopathy requiring a 36 day ICU stay. Patient is now alert and oriented. Underwent left upper arm amputation on 7/28 with Dr. 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   He is known to IR from several previous procedures including bone marrow biopsy, g-tube placement, and percutaneous drain placement s/p sclerotherapy of fluid collection. Discussed with patient second attempt of sclerotherapy, likely next week, with Dr. KANDICE Jenna to help decrease drain output. Patient is agreeable to procedure. All questions and concerns answered at the bedside.   Code Status: Full Code  Past Medical History:  Diagnosis Date   Anxiety    Arthritis    Asthma    Bipolar disorder (HCC)    Current every day smoker    Depression    Diabetes mellitus, type II (HCC)    Dyspnea    History of kidney stones    History of pneumonia    Hyperlipidemia    Hypertension    Morbid obesity (HCC)    Sedentary lifestyle    Seizures (HCC)    last seizure 12 yrs ago, no current problem   Sleep apnea    uses CPAP nightly    Past Surgical History:  Procedure Laterality Date   CARDIAC CATHETERIZATION  2020   carpal tunel Left    COLONOSCOPY     HARDWARE REMOVAL Left 07/26/2022   Procedure: HARDWARE REMOVAL ELBOW;  Surgeon: [name], MD;  Location: [facility] OR;  Service: Orthopedics;  Laterality: Left;    IR FLUORO GUIDE CV LINE RIGHT  08/07/2023   IR FLUORO GUIDE CV LINE RIGHT  09/01/2023   IR GASTROSTOMY TUBE MOD SED  08/14/2023   IR GASTROSTOMY TUBE REMOVAL  10/01/2023   IR IMAGING GUIDED PORT INSERTION  10/15/2023   IR REMOVAL TUN CV CATH W/O FL  08/29/2023   IR REMOVAL TUN CV CATH W/O FL  09/08/2023   IR SCLEROTHERAPY OF A FLUID COLLECTION  10/10/2023   IR US  GUIDE VASC ACCESS RIGHT  08/07/2023   IR US  GUIDE VASC ACCESS RIGHT  09/01/2023   ORIF HUMERUS FRACTURE Left 01/16/2022   Procedure: OPEN REDUCTION INTERNAL FIXATION (ORIF) DISTAL HUMERUS FRACTURE;  Surgeon: [name], MD;  Location: [facility] OR;  Service: Orthopedics;  Laterality: Left;   ORIF HUMERUS FRACTURE Left 01/29/2023   Procedure: OPEN REDUCTION INTERNAL FIXATION (ORIF) DISTAL HUMERUS FRACTURE;  Surgeon: [name], MD;  Location: [facility] OR;  Service: Orthopedics;  Laterality: Left;   OTHER SURGICAL HISTORY     R & L shoulder   OTHER SURGICAL HISTORY Right    Knee surgery x several   TOTAL HIP ARTHROPLASTY Left 12/26/2017   Procedure: LEFT TOTAL HIP ARTHROPLASTY ANTERIOR APPROACH;  Surgeon: Vernetta Lonni GRADE, MD;  Location: [facility] ORS;  Service: Orthopedics;  Laterality: Left;   TOTAL KNEE ARTHROPLASTY Left 11/23/2021   Procedure: LEFT TOTAL KNEE ARTHROPLASTY;  Surgeon: Vernetta Lonni GRADE, MD;  Location: [facility] ORS;  Service: Orthopedics;  Laterality: Left;    Allergies: Morphine  and codeine, Shellfish allergy, Betadine  [povidone iodine ], Bupropion, Chlorhexidine , Influenza vaccines, and Metoprolol   Medications: Prior to Admission medications   Medication Sig Start Date End Date Taking? Authorizing Provider  albuterol  (PROVENTIL  HFA;VENTOLIN  HFA) 108 (90 Base) MCG/ACT inhaler Inhale 2 puffs into the lungs every 6 (six) hours as needed for wheezing or shortness of breath.   Yes [provider]  albuterol  (PROVENTIL ) (2.5 MG/3ML) 0.083% nebulizer solution 2.5 mg every 2 (two) hours as needed for wheezing or  shortness of breath.   Yes [provider]  aspirin  EC 81 MG tablet Take 81 mg by mouth daily. Swallow whole.   Yes [provider]  busPIRone  (BUSPAR ) 15 MG tablet Take 15 mg by mouth at bedtime.   Yes [provider]  celecoxib  (CELEBREX ) 200 MG capsule Take 200 mg by mouth daily.   Yes [provider]  clonazePAM  (KLONOPIN ) 1 MG tablet Take 1 mg by mouth once as needed for anxiety.   Yes [provider]  cyclobenzaprine  (FLEXERIL ) 5 MG tablet Take 1 tablet (5 mg total) by mouth 3 (three) times daily as needed for muscle spasms. Patient taking differently: Take 5 mg by mouth daily as needed for muscle spasms. 01/30/23  Yes Danton Lauraine LABOR, PA-C  divalproex  (DEPAKOTE ) 500 MG DR tablet Take 500 mg by mouth 2 (two) times daily.   Yes [provider]  EPINEPHrine  0.3 mg/0.3 mL IJ SOAJ injection Inject 0.3 mg into the muscle as needed for anaphylaxis. 08/28/21  Yes [provider]  esomeprazole (NEXIUM) 40 MG capsule Take 40 mg by mouth in the morning.   Yes [provider]  Fluticasone -Salmeterol (ADVAIR) 500-50 MCG/DOSE AEPB Inhale 2 puffs into the lungs in the morning.   Yes [provider]  furosemide  (LASIX ) 40 MG tablet Take 40 mg by mouth daily as needed for fluid or edema.   Yes [provider]  gabapentin  (NEURONTIN ) 300 MG capsule Take 300-600 mg by mouth See admin instructions. 300 mg in the morning, 600 mg at bedtime   Yes [provider]  hydrALAZINE  (APRESOLINE ) 10 MG tablet Take 10 mg by mouth in the morning and at bedtime.   Yes [provider]  hydrochlorothiazide  (HYDRODIURIL ) 25 MG tablet Take 25 mg by mouth daily.   Yes [provider]  lisinopril  (ZESTRIL ) 40 MG tablet Take 40 mg by mouth daily. 03/25/23  Yes [provider]  metFORMIN  (GLUCOPHAGE ) 1000 MG tablet Take 1,000 mg by mouth 2 (two) times daily with a meal.   Yes [provider]   montelukast  (SINGULAIR ) 10 MG tablet Take 10 mg by mouth at bedtime.   Yes [provider]  OVER THE COUNTER MEDICATION Take 400 mg by mouth at bedtime. Burdock root   Yes [provider]  oxyCODONE  (ROXICODONE ) 5 MG immediate release tablet Take 1 tablet (5 mg total) by mouth every 4 (four) hours as needed for severe pain (pain score 7-10). 01/30/23  Yes McClung, Lauraine LABOR, PA-C  PARoxetine  (PAXIL ) 40 MG tablet Take 40 mg by mouth every morning.   Yes [provider]  spironolactone  (ALDACTONE ) 25 MG tablet Take 25 mg by mouth at bedtime. 10/07/21  Yes [provider]  tamsulosin  (FLOMAX ) 0.4 MG CAPS capsule Take 0.4 mg by mouth at bedtime. 10/07/21  Yes [provider]  testosterone  cypionate (DEPOTESTOSTERONE CYPIONATE) 200 MG/ML injection Inject 0.5 mLs (100 mg total) into the muscle every 14 (fourteen) days.  09/17/16  Yes Nida, Gebreselassie W, MD  verapamil  (CALAN -SR) 120 MG CR tablet Take 120 mg by mouth daily.   Yes [provider]  Vitamin D , Ergocalciferol , (DRISDOL ) 1.25 MG (50000 UNIT) CAPS capsule Take 1 capsule (50,000 Units total) by mouth every Thursday. Patient taking differently: Take 50,000 Units by mouth once a week. Tuesday 02/06/23  Yes Danton Lauraine LABOR, PA-C  buPROPion (WELLBUTRIN XL) 150 MG 24 hr tablet Take 150 mg by mouth daily. Patient not taking: Reported on 07/04/2023    [provider]     Family History  Problem Relation Age of Onset   Diabetes Mother    Hypertension Mother    Heart attack Mother    Osteoporosis Mother    Diabetes Father    Hypertension Father    Heart attack Father    Diabetes Sister    Hypertension Sister    Diabetes Brother    Hypertension Brother     Social History   Socioeconomic History   Marital status: Married    Spouse name: Not on file   Number of children: Not on file   Years of education: Not on file   Highest education level: Not on file  Occupational History    Not on file  Tobacco Use   Smoking status: Every Day    Current packs/day: 0.50    Average packs/day: 0.5 packs/day for 25.0 years (12.5 ttl pk-yrs)    Types: Cigarettes   Smokeless tobacco: Never  Vaping Use   Vaping status: Never Used  Substance and Sexual Activity   Alcohol  use: No   Drug use: Yes    Types: Marijuana    Comment: 01/28/2023   Sexual activity: Yes  Other Topics Concern   Not on file  Social History Narrative   Not on file   Social Drivers of Health   Financial Resource Strain: Not on file  Food Insecurity: No Food Insecurity (07/05/2023)   Hunger Vital Sign    Worried About Running Out of Food in the Last Year: Never true    Ran Out of Food in the Last Year: Never true  Transportation Needs: No Transportation Needs (07/05/2023)   PRAPARE - Administrator, Civil Service (Medical): No    Lack of Transportation (Non-Medical): No  Physical Activity: Not on file  Stress: Not on file  Social Connections: Not on file    Review of Systems Denies any N/V, chest pain, shortness of breath, fevers/chills. All other ROS negative.  Vital Signs: BP 121/89   Pulse 98   Temp 98 F (36.7 C)   Resp 18   Ht 6' (1.829 m)   Wt 235 lb 14.3 oz (107 kg)   SpO2 95%   BMI 31.99 kg/m    Physical Exam Vitals reviewed.  Constitutional:      Appearance: Normal appearance.  HENT:     Mouth/Throat:     Mouth: Mucous membranes are moist.     Pharynx: Oropharynx is clear.  Cardiovascular:     Rate and Rhythm: Normal rate and regular rhythm.     Heart sounds: Normal heart sounds.  Pulmonary:     Effort: Pulmonary effort is normal.     Breath sounds: Normal breath sounds.  Abdominal:     General: Abdomen is flat.     Palpations: Abdomen is soft.     Tenderness: There is no abdominal tenderness.     Comments: G-tube in place Hip drain in place w/ ~20cc  dark red/brown output in bulb to suction  Musculoskeletal:     Cervical back: Normal range of motion.      Comments: LUE amputation w/ clean/dry overlying bandage and wrap  Skin:    General: Skin is warm and dry.  Neurological:     Mental Status: He is alert and oriented to person, place, and time.  Psychiatric:        Mood and Affect: Mood normal.     Imaging: IR IMAGING GUIDED PORT INSERTION Result Date: 10/15/2023 INDICATION: 58 year old male with history multiple myeloma requiring central venous access for chemotherapy administration EXAM: IMPLANTED PORT A CATH PLACEMENT WITH ULTRASOUND AND FLUOROSCOPIC GUIDANCE COMPARISON:  None Available. MEDICATIONS: None. ANESTHESIA/SEDATION: Moderate (conscious) sedation was employed during this procedure. A total of Versed  1.5 mg and Fentanyl  75 mcg was administered intravenously. Moderate Sedation Time: 14 minutes. The patient's level of consciousness and vital signs were monitored continuously by radiology nursing throughout the procedure under my direct supervision. CONTRAST:  None FLUOROSCOPY TIME:  Ten mGy reference air kerma COMPLICATIONS: None immediate. PROCEDURE: The procedure, risks, benefits, and alternatives were explained to the patient. Questions regarding the procedure were encouraged and answered. The patient understands and consents to the procedure. The right neck and chest were prepped with chlorhexidine  in a sterile fashion, and a sterile drape was applied covering the operative field. Maximum barrier sterile technique with sterile gowns and gloves were used for the procedure. A timeout was performed prior to the initiation of the procedure. Ultrasound was used to examine the jugular vein which was compressible and free of internal echoes. A skin marker was used to demarcate the planned venotomy and port pocket incision sites. Local anesthesia was provided to these sites and the subcutaneous tunnel track with 1% lidocaine  with 1:100,000 epinephrine . A small incision was created at the jugular access site and blunt dissection was performed of  the subcutaneous tissues. Under ultrasound guidance, the jugular vein was accessed with a 21 ga micropuncture needle and an 0.018 wire was inserted to the superior vena cava. Real-time ultrasound guidance was utilized for vascular access including the acquisition of a permanent ultrasound image documenting patency of the accessed vessel. A 5 Fr micopuncture set was then used, through which a 0.035 Rosen wire was passed under fluoroscopic guidance into the inferior vena cava. An 8 Fr dilator was then placed over the wire. A subcutaneous port pocket was then created along the upper chest wall utilizing a combination of sharp and blunt dissection. The pocket was irrigated with sterile saline, packed with gauze, and observed for hemorrhage. A single lumen ISP sized power injectable port was chosen for placement. The 8 Fr catheter was tunneled from the port pocket site to the venotomy incision. The port was placed in the pocket. The external catheter was trimmed to appropriate length. The dilator was exchanged for an 8 Fr peel-away sheath under fluoroscopic guidance. The catheter was then placed through the sheath and the sheath was removed. Final catheter positioning was confirmed and documented with a fluoroscopic spot radiograph. The port was accessed with a Huber needle, aspirated, and flushed with heparinized saline. The deep dermal layer of the port pocket incision was closed with interrupted 3-0 Vicryl suture. Dermabond was then placed over the port pocket and neck incisions. The patient tolerated the procedure well without immediate post procedural complication. FINDINGS: After catheter placement, the tip lies within the superior cavoatrial junction. The catheter aspirates and flushes normally and is ready for immediate use. IMPRESSION: Successful  placement of a power injectable Port-A-Cath via the right internal jugular vein. The catheter is ready for immediate use. Ester Sides, MD [facility] Electronically Signed   By: Ester Sides M.D.   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 Multiple myeloma. EXAM: LEFT HUMERUS - 2+ VIEW COMPARISON:  09/15/2023 FINDINGS: A single C-arm image demonstrates the recently demonstrated lytic changes in the distal humerus and associated fixation hardware. IMPRESSION: Single C-arm image of the distal humerus and associated fixation hardware. Electronically Signed   By: Elspeth Bathe M.D.   On: 10/13/2023 11:05   DG C-Arm 1-60 Min-No Report Result Date: 10/13/2023 Fluoroscopy was utilized by the requesting physician.  No radiographic interpretation.   DG C-Arm 1-60 Min-No Report Result Date: 10/13/2023 Fluoroscopy was utilized by the requesting physician.  No radiographic interpretation.   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 Planned sclerosing. EXAM: Sclerosing of abnormal fluid collection COMPARISON:  None Available. MEDICATIONS: 500 mg doxycycline ; intra procedural ANESTHESIA/SEDATION: Moderate (conscious) sedation was employed during this procedure. A total of Versed  2 mg and Fentanyl  100 mcg was administered intravenously by the radiology nurse. Total intra-service moderate Sedation Time: 11 minutes. The patient's level of consciousness and vital signs were monitored continuously by radiology nursing throughout the procedure under my direct supervision. CONTRAST:  10 mL Omnipaque  300-administered into the collecting system(s) FLUOROSCOPY: Radiation Exposure Index (as provided by the fluoroscopic device): 5 mGy Kerma COMPLICATIONS: None immediate. PROCEDURE: Informed written consent was obtained from the patient after a thorough discussion of the procedural risks, benefits and alternatives. All questions  were addressed. Maximal Sterile Barrier Technique was utilized including caps, mask, sterile gowns, sterile gloves, sterile drape, hand hygiene and skin antiseptic. A timeout was performed prior to the initiation of the procedure. The indwelling catheter in the right hip region prepped and draped in usual sterile fashion. The indwelling 10 French catheter was aggressively irrigated with normal saline and flushed. Dilute contrast was then injected under fluoroscopic guidance into the catheter to evaluate if the fluid collection was communicating with vascular or lymphatic structure. Contrast flows freely into the catheter and outlines the fluid collection which is irregular in appearance, however there is no vascular or lymphatic communication identified. The contrast was then mostly withdrawn. The catheter and retention sutures were cut. A new 10 French catheter was then advanced over a guidewire into the fluid collection and repositioned in a more cephalad location. Approximately 500 mg of doxycycline  was reconstituted with 50 mL of normal saline and then approximately 35 mL of this mixture was injected into the 10 French pigtail catheter. The catheter was then capped. Retention suture and sterile dressing were applied. The doxycycline  will dwell within the cavity for approximately 1 hour and then the catheter will be connected to a JP bulb and the patient will be monitored for output. IMPRESSION: Satisfactory exchange of a 10 French right hip drainage catheter with sclerosing using doxycycline  as described above. Catheter will remain to a JP bulb until output has sufficiently diminished. Electronically Signed   By: Cordella Banner   On: 10/10/2023 17:18   CT PELVIS WO CONTRAST Result Date: 10/07/2023 CLINICAL DATA:  Right hip drain evaluation. EXAM: CT PELVIS WITHOUT CONTRAST TECHNIQUE: Multidetector CT imaging of the pelvis was performed following the standard protocol without intravenous contrast. RADIATION  DOSE REDUCTION: This exam was performed according to the departmental dose-optimization program which includes automated exposure control, adjustment of the mA and/or kV according to patient size and/or use of iterative reconstruction technique. COMPARISON:  CT abdomen pelvis dated 07/08/2023. FINDINGS: Urinary Tract: The visualized ureters appear unremarkable. Punctate stone noted in the posterior bladder lumen. The urinary bladder is otherwise unremarkable. Bowel:  Moderate stool in the colon.  The appendix is normal. Vascular/Lymphatic: Moderate aortoiliac atherosclerotic disease. No pelvic adenopathy. Reproductive: The prostate and seminal vesicles are grossly unremarkable. Other: Right gluteal pigtail drainage catheter. Significant interval decrease in the size of the hematoma in the right gluteal muscle now measuring approximately 4.7 x 9.3 cm. Additional loculated collection in the anterior upper right thigh significantly decreased in size since the prior CT and measures approximately 10 x 17 cm in greatest axial dimensions. Musculoskeletal: Osteopenia with degenerative changes of the spine. Total left hip arthroplasty. Scattered lucent lesions throughout the bones as seen on the prior CT. IMPRESSION: 1. Right gluteal pigtail drainage catheter with significant interval decrease in the size of the hematoma in the right gluteal muscle. 2. Significant interval decrease in the size of the loculated collection in the anterior upper right thigh. 3. Punctate stone in the posterior bladder lumen. 4.  Aortic Atherosclerosis (ICD10-I70.0). Electronically Signed   By: Vanetta Chou M.D.   On: 10/07/2023 16:53   DG CHEST PORT 1 VIEW Result Date: 10/06/2023 EXAM: 1 VIEW XRAY OF THE CHEST 10/06/2023 08:52:00 PM COMPARISON: 09/28/2023 CLINICAL HISTORY: 141880 SOB (shortness of breath) 141880. sob FINDINGS: LUNGS AND PLEURA: Increased interstitial markings, left greater than right, favoring atypical infection /  pneumonia over interstitial edema. No pleural effusion. No pneumothorax. HEART AND MEDIASTINUM: No acute abnormality of the cardiac and mediastinal silhouettes. BONES AND SOFT TISSUES: Degenerative changes of the bilateral shoulders. No acute osseous abnormality. IMPRESSION: 1. Increased interstitial markings, left greater than right, favoring atypical infection/pneumonia over interstitial edema. Electronically signed by: Pinkie Pebbles MD 10/06/2023 08:58 PM EDT RP Workstation: [id number]   CT FEMUR RIGHT WO CONTRAST Result Date: 10/03/2023 CLINICAL DATA:  Right thigh fluid collection, drain maintenance EXAM: CT OF THE LOWER RIGHT EXTREMITY WITHOUT CONTRAST TECHNIQUE: Multidetector CT imaging of the right lower extremity was performed according to the standard protocol. RADIATION DOSE REDUCTION: This exam was performed according to the departmental dose-optimization program which includes automated exposure control, adjustment of the mA and/or kV according to patient size and/or use of iterative reconstruction technique. COMPARISON:  09/24/2023 FINDINGS: Bones/Joint/Cartilage Stable scattered bone lucencies, see description from the 09/24/2023 exam. Stable right hip arthropathy. Prominent osteoarthritis of the right knee. Left total knee prosthesis partially observed. Large spur like projections from the inferior patella extending into the joint. Moderate knee effusion possibly with synovitis. Suspected free osteochondral fragments in the knee joint. Ligaments N/A Muscles and Tendons Semimembranosus muscular atrophy bilaterally. Soft tissues Reduced size of the fluid collection along the superficial fascia margin of the tensor fascia lata and rectus femoris, currently measuring about 20.9 by 3.0 by 11.8 cm (volume = 390 cm^3). Previous total volume cannot be calculated as this was not completely included, but previous total volume was probably about 3 times is much. Surrounding subcutaneous edema along the right  lateral thigh tracking down to the knee. There is also some subcutaneous edema medially in both thighs. Edema tracks along the superficial fascia margin of the posterior calf musculature in the upper calf region. Prominence of stool in visualized colon, cannot exclude constipation. Atherosclerosis noted. IMPRESSION:  1. Reduced size of the fluid collection along the superficial fascia margin of the tensor fascia lata and rectus femoris, currently measuring about 20.9 by 3.0 by 11.8 cm (volume = 390 cc). Previous total volume cannot be calculated as this was not completely included, but previous total volume was probably about 3 times is much. 2. Surrounding subcutaneous edema along the right lateral thigh tracking down to the knee. There is also some subcutaneous edema medially in both thighs. Edema tracks along the superficial fascia margin of the posterior calf musculature in the upper calf region. 3. Prominent osteoarthritis of the right knee. Moderate knee effusion possibly with synovitis. Suspected free osteochondral fragments in the knee joint. 4. Prominence of stool in visualized colon, cannot exclude constipation. 5. Atherosclerosis. 6. Stable scattered bone lucencies, see description from the 09/24/2023 exam. Electronically Signed   By: Ryan Salvage M.D.   On: 10/03/2023 08:21   VAS US  LOWER EXTREMITY VENOUS (DVT) Result Date: 10/01/2023  Lower Venous DVT Study Patient Name:  Frank [name].  Date of Exam:   10/01/2023 Medical Rec #: [id number]           Accession #:    [id number] Date of Birth: 07-29-1965           Patient Gender: M Patient Age:   89 years Exam Location:  [facility] Procedure:      VAS US  LOWER EXTREMITY VENOUS (DVT) Referring Phys: DELILIAH RASHID --------------------------------------------------------------------------------  Indications: Right thigh swelling, erythema, pain.  Risk Factors: Extended hospitalization with immobility (3 months). Limitations: Poor  ultrasound/tissue interface. Comparison Study: LLEV on 07/18/2023 was negative for DVT. Performing Technologist: Ezzie Potters RVT, RDMS  Examination Guidelines: A complete evaluation includes B-mode imaging, spectral Doppler, color Doppler, and power Doppler as needed of all accessible portions of each vessel. Bilateral testing is considered an integral part of a complete examination. Limited examinations for reoccurring indications may be performed as noted. The reflux portion of the exam is performed with the patient in reverse Trendelenburg.  +---------+---------------+---------+-----------+----------+-------------------+ RIGHT    CompressibilityPhasicitySpontaneityPropertiesThrombus Aging      +---------+---------------+---------+-----------+----------+-------------------+ CFV      Full           Yes      Yes                                      +---------+---------------+---------+-----------+----------+-------------------+ SFJ      Full                                                             +---------+---------------+---------+-----------+----------+-------------------+ FV Prox  Full           Yes      Yes                                      +---------+---------------+---------+-----------+----------+-------------------+ FV Mid   Full           Yes      Yes                                      +---------+---------------+---------+-----------+----------+-------------------+  FV DistalFull           Yes      Yes                                      +---------+---------------+---------+-----------+----------+-------------------+ PFV      Full                                                             +---------+---------------+---------+-----------+----------+-------------------+ POP      Full           Yes      Yes                                      +---------+---------------+---------+-----------+----------+-------------------+ PTV      None            No       No                   Age indeterminate                                                         (one of paired)     +---------+---------------+---------+-----------+----------+-------------------+ PERO     Full                                                             +---------+---------------+---------+-----------+----------+-------------------+   +---------+---------------+---------+-----------+----------+--------------+ LEFT     CompressibilityPhasicitySpontaneityPropertiesThrombus Aging +---------+---------------+---------+-----------+----------+--------------+ CFV      None           No       No                   Acute          +---------+---------------+---------+-----------+----------+--------------+ SFJ      Partial                                      Acute          +---------+---------------+---------+-----------+----------+--------------+ FV Prox  Full           Yes      Yes                                 +---------+---------------+---------+-----------+----------+--------------+ FV Mid   Full           Yes      Yes                                 +---------+---------------+---------+-----------+----------+--------------+ FV DistalFull  Yes      Yes                                 +---------+---------------+---------+-----------+----------+--------------+ PFV      None           No       No                                  +---------+---------------+---------+-----------+----------+--------------+ POP      Full           Yes      Yes                                 +---------+---------------+---------+-----------+----------+--------------+ PTV      Full                                                        +---------+---------------+---------+-----------+----------+--------------+ PERO     Full                                                         +---------+---------------+---------+-----------+----------+--------------+ Proximal CFV compresses, has color, and phasic flow. Mid and distal CFV have occlusive thrombus    Summary: BILATERAL: -No evidence of popliteal cyst, bilaterally. RIGHT: - Findings consistent with age indeterminate deep vein thrombosis involving the right posterior tibial veins.   LEFT: - Findings consistent with acute deep vein thrombosis involving the left common femoral vein, SF junction, and left femoral vein.  - The common femoral vein obstruction does not appear to extend above inguinal ligament.  *See table(s) above for measurements and observations. Electronically signed by Gaile New MD on 10/01/2023 at 5:11:34 PM.    Final    IR GASTROSTOMY TUBE REMOVAL Result Date: 10/01/2023 INDICATION: Gastrostomy tube is no longer needed. EXAM: GASTROSTOMY TUBE REMOVAL MEDICATIONS: None ANESTHESIA/SEDATION: None CONTRAST:  None FLUOROSCOPY: None COMPLICATIONS: None immediate. PROCEDURE: Informed written consent was obtained from the patient after a thorough discussion of the procedural risks, benefits and alternatives. All questions were addressed. A timeout was performed prior to the initiation of the procedure. Balloon retention 18 French gastrostomy tube was intact. 5 cc syringe was used to remove 5 cc of saline from balloon. Gastrostomy tube was removed without difficulty. Site was cleaned and dressed appropriately. IMPRESSION: Successful removal of 18 French balloon retention gastrostomy tube. Performed by Carlin Griffon, PA-C Electronically Signed   By: Juliene Balder M.D.   On: 10/01/2023 16:40   US  RT LOWER EXTREM LTD SOFT TISSUE NON VASCULAR Result Date: 09/30/2023 CLINICAL DATA:  Evaluate for abscess. EXAM: ULTRASOUND RIGHT LOWER EXTREMITY LIMITED TECHNIQUE: Ultrasound examination of the lower extremity soft tissues was performed in the area of clinical concern. COMPARISON:  None Available. FINDINGS: In the region of palpable  abnormality in the lateral aspect of the right thigh there is a well-circumscribed is 8.5 x 2.3 x 6.8 cm fluid collection in the subcutaneous tissues. This is 1.8 cm deep  to the skin. There is no abnormal vascularity. There is no soft tissue mass. IMPRESSION: 8.5 cm fluid collection in the subcutaneous tissues of the lateral aspect of the right thigh. This may represent a seroma or abscess. Electronically Signed   By: Greig Pique M.D.   On: 09/30/2023 21:47   DG Knee 1-2 Views Right Result Date: 09/29/2023 CLINICAL DATA:  Knee pain and swelling EXAM: RIGHT KNEE - 1-2 VIEW COMPARISON:  X-ray 01/18/2019 FINDINGS: Moderate joint effusion. Moderate joint space loss of the medial compartment and patellofemoral joint. Tricompartmental osteophytes are seen greatest of the patellofemoral joint. Chondrocalcinosis also identified. There is medial translation of the femur relative to the tibia as well. Preserved bone mineralization. IMPRESSION: Advanced tricompartmental degenerative changes. Moderate joint effusion. Chondrocalcinosis Electronically Signed   By: Ranell Bring M.D.   On: 09/29/2023 15:57   DG Ribs Unilateral Left Result Date: 09/28/2023 CLINICAL DATA:  Left-sided rib pain after cough EXAM: LEFT RIBS - 2 VIEW COMPARISON:  Chest x-ray 09/28/2023.  Chest CT 07/26/2023. FINDINGS: Expansile anterolateral left 6 rib lesion is again noted. No displaced fractures are identified. Lungs are grossly clear. IMPRESSION: Expansile anterolateral left 6 rib lesion is again noted. Correlate for point tenderness. No displaced fractures are identified. Electronically Signed   By: Greig Pique M.D.   On: 09/28/2023 21:50   DG CHEST PORT 1 VIEW Result Date: 09/28/2023 CLINICAL DATA:  Chest pain.  Attention left ribs. EXAM: PORTABLE CHEST 1 VIEW COMPARISON:  08/23/2023, CT 07/26/2023 FINDINGS: Removal of right-sided dialysis catheter and tracheostomy tube. Low lung volumes persist. Ill-defined opacity at the left lung base  likely pleural effusion and airspace disease/atelectasis. Stable heart size and mediastinal contours. Vascular congestion. No pneumothorax. Chronic bilateral shoulder arthropathy. Lucent lesions within the thoracic osseous structures on prior CT, including left rib, not well demonstrated by radiograph. IMPRESSION: 1. Ill-defined opacity at the left lung base likely pleural effusion and airspace disease/atelectasis. 2. Vascular congestion. 3. Lucent lesions within the thoracic osseous structures on prior CT, including left rib, are not well demonstrated by radiograph. Electronically Signed   By: Andrea Gasman M.D.   On: 09/28/2023 16:40   US  IMAGE GUIDED FLUID DRAIN BY CATHETER Result Date: 09/26/2023 CLINICAL DATA:  Large right gluteal fluid collection likely evolving hematoma or potential abscess EXAM: US  IMAGE GUIDED FLUID DRAIN BY CATHETER TECHNIQUE: Ultrasound guidance COMPARISON:  None Available. FINDINGS: With the patient in a supine position, the right gluteal region and hip region were evaluated with ultrasound. A large fluid collection corresponding with previous visualized fluid collection is again seen. The patient was prepped and draped in usual sterile fashion. Local anesthesia was achieved by infiltrating subcutaneous tissue with 1% lidocaine . A small incision was made and then a 7 cm Yueh needle was advanced under ultrasound guidance from the incision to midpoint of the fluid collection. The needle was removed and short Amplatz wire was advanced under ultrasound guidance and coiled within the fluid collection. The Yueh catheter was then removed and the access site was dilated with a 10 French fascial dilator. A 10 French pigtail catheter was then advanced over the guidewire and coiled within the fluid collection. Locking mechanism engaged. Retention suture and sterile dressing applied. Small sample was obtained and sent to pathology for aerobic and anaerobe evaluation. IMPRESSION: Satisfactory  10 French drainage catheter placed in a right hip abscess. Fluid appears to be infected hematoma. Electronically Signed   By: Cordella Banner   On: 09/26/2023 10:41   CT  HEAD WO CONTRAST ( ) Result Date: 09/25/2023 CLINICAL DATA:  Initial evaluation for acute neuro deficit, stroke suspected. EXAM: CT HEAD WITHOUT CONTRAST TECHNIQUE: Contiguous axial images were obtained from the base of the skull through the vertex without intravenous contrast. RADIATION DOSE REDUCTION: This exam was performed according to the departmental dose-optimization program which includes automated exposure control, adjustment of the mA and/or kV according to patient size and/or use of iterative reconstruction technique. COMPARISON:  CT from 09/22/2023. FINDINGS: Brain: Cerebral volume within normal limits. Patchy hypodensity involving the supratentorial cerebral white matter, most characteristic of chronic microvascular ischemic disease, moderate in nature. No acute intracranial hemorrhage. No acute large vessel territory infarct. No mass lesion, midline shift or mass effect. No hydrocephalus or extra-axial fluid collection. Vascular: No abnormal hyperdense vessel. Calcified atherosclerosis present at the skull base. Skull: Scalp soft tissues demonstrate no acute finding. Multiple lucent lesions again noted throughout the calvarium and visualized skull base, consistent with history of multiple myeloma. Sinuses/Orbits: Globes orbital soft tissues within normal limits. Mild mucosal thickening noted about the right maxillary sinus. Minimal pneumatized secretions noted within the right sphenoid sinus. Large right mastoid and middle ear effusion, with small left mastoid effusion. Imaged nasopharynx grossly unremarkable. Other: None. IMPRESSION: 1. No acute intracranial abnormality. 2. Moderate chronic microvascular ischemic disease. 3. Multiple lucent lesions throughout the calvarium and visualized skull base, consistent with history of  multiple myeloma. 4. Large right mastoid and middle ear effusion, with small left mastoid effusion. Findings of uncertain significance, however, correlation with physical exam for possible acute otomastoiditis recommended. Electronically Signed   By: Morene Hoard M.D.   On: 09/25/2023 20:46   CT HIP RIGHT WO CONTRAST Result Date: 09/25/2023 CLINICAL DATA:  Right hip pain EXAM: CT OF THE RIGHT HIP WITHOUT CONTRAST TECHNIQUE: Multidetector CT imaging of the right hip was performed according to the standard protocol. Multiplanar CT image reconstructions were also generated. RADIATION DOSE REDUCTION: This exam was performed according to the departmental dose-optimization program which includes automated exposure control, adjustment of the mA and/or kV according to patient size and/or use of iterative reconstruction technique. COMPARISON:  CT right femur 08/01/2023 FINDINGS: Bones/Joint/Cartilage Moderate to prominent axial and craniocaudad chondral thinning in the right hip associated with substantial spurring of the right femoral head. Mildly aspherical femoral head morphology may predispose to cam type femoroacetabular impingement. Scattered degenerative subcortical cystic lesions along the right femoral head and acetabulum. Scattered lucencies in the marrow of the visualized bony structures including the right iliac bone, ischium, and right proximal femur. Entities such as myeloma are not excluded. Similar lesions have been reported in other bony structures example in cranial structures on the MRI head from 07/26/2023. Ligaments Suboptimally assessed by CT. Muscles and Tendons Moderate regional muscular atrophy. Soft tissues Extending partially along the superficial fascia margin of the gluteus medius and rectus femoris muscle, there is a large fluid collection which extends above and below the margins of today's CT scan. This measures 12.9 by 6.3 cm in cross-section on image 93 of series 5 and was also  present on 08/01/2023, tracking cephalad on that exam all the way up to the level of the iliac crests. Appearance favors chronic hematoma or possibly a Morel-Lavalle lesion given the lack of resolution. Right iliac artery atherosclerosis. IMPRESSION: 1. 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 Mildly aspherical femoral head morphology may predispose to cam type femoroacetabular impingement. 2. Scattered lucencies in the marrow of the visualized bony structures including the right iliac bone,  ischium, and right proximal femur. Entities such as myeloma are not excluded. Similar lesions have been reported in other bony structures example in cranial structures on the MRI head from 07/26/2023. 3. Large fluid collection extending partially along the superficial fascia margin of the gluteus medius and rectus femoris muscle, also present on 08/01/2023, tracking cephalad on that exam all the way up to the level of the iliac crests. Appearance favors chronic hematoma or possibly a Morel-Lavallee lesion given the lack of resolution. 4. Moderate regional muscular atrophy. 5. Right iliac artery atherosclerosis. Electronically Signed   By: Ryan Salvage M.D.   On: 09/25/2023 08:00   CT HEAD WO CONTRAST ( ) Result Date: 09/23/2023 EXAM: CT HEAD WITHOUT CONTRAST 09/22/2023 09:56:16 PM TECHNIQUE: CT of the head was performed without the administration of intravenous contrast. Automated exposure control, iterative reconstruction, and/or weight based adjustment of the mA/kV was utilized to reduce the radiation dose to as low as reasonably achievable. COMPARISON: None available. CLINICAL HISTORY: Transient ischemic attack (TIA). FINDINGS: BRAIN AND VENTRICLES: No acute hemorrhage. Gray-white differentiation is preserved. No hydrocephalus. No extra-axial collection. No mass effect or midline shift. Periventricular and scattered subcortical white matter hypoattenuation is moderately advanced for age, stable. Mild  generalized atrophy is stable. ORBITS: No acute abnormality. SINUSES: Bilateral mastoid effusions are again noted, right greater than left. Minimal right maxillary sinus disease is again seen. Secretions are present within the right sphenoid sinus. SOFT TISSUES AND SKULL: No acute soft tissue abnormality. No skull fracture. A lytic lesion is again noted within a right greater than left clivus. Multiple smaller lytic lesions are present throughout the calvarium, stable. VASCULATURE: Atherosclerotic calcifications are present in the cavernous carotid arteries bilaterally. No hyperdense vessel is present. IMPRESSION: 1. No acute intracranial abnormality related to the clinical history of TIA. 2. Moderately advanced periventricular and scattered subcortical white matter hypoattenuation, stable. 3. Mild generalized atrophy, stable. 4. Bilateral mastoid effusions, right greater than left, and minimal right maxillary sinus disease, again seen. Secretions are present within the right sphenoid sinus. 5. Lytic lesion within a right greater than left clivus and multiple smaller lytic lesions throughout the calvarium, stable. Electronically signed by: Lonni Necessary MD 09/23/2023 05:20 AM EDT RP Workstation: [id number]    Labs:  CBC: Recent Labs    10/13/23 0610 10/14/23 0452 10/15/23 0518 10/16/23 0525  WBC 5.7 6.7 8.5 7.7  HGB 10.3* 8.1* 10.0* 9.4*  HCT 32.3* 25.3* 30.6* 28.9*  PLT 371 319 297 291    COAGS: Recent Labs    07/05/23 2227 07/07/23 2030 07/10/23 0411 07/26/23 0423 08/13/23 0908 10/02/23 1456 10/02/23 2305 10/03/23 0719  INR 2.1* 1.9*  --   --  1.0  --   --   --   APTT  --  44*   < > 36  --  43* 39* 52*   < > = values in this interval not displayed.    BMP: Recent Labs    10/12/23 0533 10/14/23 0452 10/15/23 0518 10/16/23 0525  NA 131* 131* 129* 128*  K 5.1 5.8* 5.3* 4.5  CL 101 101 98 96*  CO2 20* 21* 21* 21*  GLUCOSE 126* 146* 112* 126*  BUN 24* 34* 40* 38*   CALCIUM  8.3* 8.6* 8.8* 8.2*  CREATININE 1.40* 1.63* 1.49* 1.42*  GFRNONAA 58* 48* 54* 57*    LIVER FUNCTION TESTS: Recent Labs    10/04/23 0328 10/05/23 0533 10/06/23 9385 10/07/23 9361 10/12/23 0533 10/14/23 0452 10/15/23 0518 10/16/23 0525  BILITOT 0.6 0.2  0.4 0.5  --   --   --   --   AST 9* 8* 10* 9*  --   --   --   --   ALT 6 6 7 5   --   --   --   --   ALKPHOS 78 84 89 83  --   --   --   --   PROT 5.2* 5.3* 5.5* 5.5*  --   --   --   --   ALBUMIN  2.0* 2.0* 2.2* 2.1* 2.3* 2.5* 2.6* 2.3*    TUMOR MARKERS: No results for input(s): AFPTM, CEA, CA199, CHROMGRNA in the last 8760 hours.  Assessment and Plan:  Persistent right hip fluid collection s/p sclerotherapy: Frank [name]. is a 58 y.o. male with a history of OSA, A-fib on Eliquis , Tobacco use disorder, recent diagnosis of multiple myeloma, and right gluteus medius and rectus femoris fluid collection s/p right hip drain placement and sclerotherapy.    -Drain continues to have elevated output; discussed with Dr. Jenna who wants to repeat sclerotherapy likely next week -Patient is agreeable to this plan -Reports nurses continue to flush drain daily -Discussed with nursing staff we no longer need to proceed with daily flushing of drain and order was deleted -IR will plan for repeat sclerotherapy possibly on 8/7 with Dr. KANDICE Jenna; consent in chart   Thank you for this interesting consult. I greatly enjoyed meeting Frank [name]. and look forward to participating in their care. A copy of this report was sent to the requesting provider on this date.  Electronically Signed: Derin Matthes M Winferd Wease, PA-C 10/16/2023, 1:04 PM   I spent a total of 20 Minutes in face to face clinical consultation, greater than 50% of which was counseling/coordinating care for drain care.

## 2023-10-16 NOTE — Progress Notes (Signed)
 Physical Therapy Treatment Patient Details Name: Frank [name]. MRN: [id number] DOB: 06/06/1965 Today's Date: 10/16/2023   History of Present Illness 58 y.o. male transferred from [facility] 07/03/23 to [facility] for management of newly diagnosed plasma cell myeloma with weakness and AMS. Pt also with AKI and metabolic encephalopathy. 5/10 transfer to [facility] for iHD. 4/22 Intubated and chemo initiated. 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 5/6 IVIG started. 5/8-6/11 trach. 5/12 iHD initiated. 5/29 PEG placed. 6/5 chemo initiated. PMHx:Lt THA, carpal tunnel syndrome, Lt TKA, Lt humerus fx, PAF, HTN, HLD, bipolar disorder, obesity, T2DM    PT Comments  STAR PT/OT Session: Pt and wife in room on entry. Pt requesting to get up to [facility] for BM. Pt requiring maxAx2 to come to [facility]. With use of Stedy pt requires max-total Ax2 for coming from sit to stand from bed, Stedy pads and BSC. Pt requiring totalAx 2 for return to bed. with increased numbness in B LE with sitting on BSC. Pt completely exhausted at end of session. Will follow back tomorrow with goal of getting to wheelchair.    If plan is discharge home, recommend the following: Two people to help with walking and/or transfers;Assistance with cooking/housework;Assist for transportation;Two people to help with bathing/dressing/bathroom;Direct supervision/assist for medications management;Direct supervision/assist for financial management;Help with stairs or ramp for entrance;Assistance with feeding   Can travel by private vehicle     No  Equipment Recommendations  Hospital bed;Hoyer lift;Wheelchair (measurements PT);Wheelchair cushion (measurements PT);BSC/3in1       Precautions / Restrictions Precautions Precautions: Fall Recall of Precautions/Restrictions: Impaired Restrictions Weight Bearing Restrictions Per Provider Order: Yes LUE Weight Bearing Per Provider Order: Non weight bearing Other Position/Activity Restrictions: ROM to tolerance      Mobility  Bed Mobility Overal bed mobility: Needs Assistance Bed Mobility: Supine to Sit, Sit to Supine Rolling: Max assist, Used rails   Supine to sit: Max assist, HOB elevated, Used rails Sit to supine: +2 for physical assistance, Total assist   General bed mobility comments: increased time and max assist to get to EOB, assistance with BLEs and trunk to return to supine    Transfers Overall transfer level: Needs assistance Equipment used: Ambulation equipment used Transfers: Sit to/from Stand, Bed to chair/wheelchair/BSC Sit to Stand: +2 physical assistance, Max assist, +2 safety/equipment, Total assist           General transfer comment: transfer to [facility] with max assist +2 for sit to stand from EOB and total assist +2 to stand from [facility] Transfer via Lift Equipment: Stedy      Balance Overall balance assessment: Needs assistance Sitting-balance support: Feet supported, No upper extremity supported Sitting balance-Leahy Scale: Fair Sitting balance - Comments: EOB   Standing balance support: Single extremity supported, During functional activity, Reliant on assistive device for balance Standing balance-Leahy Scale: Zero Standing balance comment: stood in stedy with difficulty to stand erect enough to use Stedy pads                            Communication Communication Communication: Impaired Factors Affecting Communication: Reduced clarity of speech  Cognition Arousal: Alert Behavior During Therapy: WFL for tasks assessed/performed                             Following commands: Impaired Following commands impaired: Follows one step commands with increased time    Cueing Cueing Techniques: Verbal cues,  Tactile cues, Visual cues     General Comments General comments (skin integrity, edema, etc.): VSS on RA      Pertinent Vitals/Pain Pain Assessment Faces Pain Scale: Hurts even more Pain Location: back, right knee, LUE surgical  site Pain Descriptors / Indicators: Discomfort, Aching, Guarding Pain Intervention(s): Limited activity within patient's tolerance, Monitored during session, Repositioned     PT Goals (current goals can now be found in the care plan section) Acute Rehab PT Goals PT Goal Formulation: With patient Time For Goal Achievement: 10/23/23 Potential to Achieve Goals: Fair Progress towards PT goals: Progressing toward goals    Frequency    Min 5X/week      PT Plan      Co-evaluation PT/OT/SLP Co-Evaluation/Treatment: Yes Reason for Co-Treatment: For patient/therapist safety;To address functional/ADL transfers PT goals addressed during session: Mobility/safety with mobility;Balance OT goals addressed during session: ADL's and self-care;Strengthening/ROM      AM-PAC PT 6 Clicks Mobility   Outcome Measure  Help needed turning from your back to your side while in a flat bed without using bedrails?: A Lot Help needed moving from lying on your back to sitting on the side of a flat bed without using bedrails?: Total Help needed moving to and from a bed to a chair (including a wheelchair)?: Total Help needed standing up from a chair using your arms (e.g., wheelchair or bedside chair)?: Total Help needed to walk in hospital room?: Total Help needed climbing 3-5 steps with a railing? : Total 6 Click Score: 7    End of Session Equipment Utilized During Treatment: Gait belt Activity Tolerance: Patient tolerated treatment well;Patient limited by fatigue Patient left: in bed;with call bell/phone within reach;with family/visitor present Nurse Communication: Mobility status PT Visit Diagnosis: Other abnormalities of gait and mobility (R26.89);Muscle weakness (generalized) (M62.81);Difficulty in walking, not elsewhere classified (R26.2)     Time: 8660-8578 PT Time Calculation (min) (ACUTE ONLY): 42 min  Charges:    $Therapeutic Activity: 8-22 mins PT General Charges $$ ACUTE PT VISIT:  1 Visit                     Starlee Corralejo B. Fleeta Lapidus PT, DPT Acute Rehabilitation Services Please use secure chat or  Call Office [phone]    [name] [facility] 10/16/2023, 3:23 PM

## 2023-10-17 DIAGNOSIS — R531 Weakness: Secondary | ICD-10-CM | POA: Diagnosis not present

## 2023-10-17 DIAGNOSIS — R6521 Severe sepsis with septic shock: Secondary | ICD-10-CM | POA: Diagnosis not present

## 2023-10-17 DIAGNOSIS — A419 Sepsis, unspecified organism: Secondary | ICD-10-CM | POA: Diagnosis not present

## 2023-10-17 DIAGNOSIS — C9000 Multiple myeloma not having achieved remission: Secondary | ICD-10-CM | POA: Diagnosis not present

## 2023-10-17 DIAGNOSIS — N1832 Chronic kidney disease, stage 3b: Secondary | ICD-10-CM | POA: Diagnosis not present

## 2023-10-17 LAB — CBC
HCT: 30.3 % — ABNORMAL LOW (ref 39.0–52.0)
Hemoglobin: 9.7 g/dL — ABNORMAL LOW (ref 13.0–17.0)
MCH: 32.1 pg (ref 26.0–34.0)
MCHC: 32 g/dL (ref 30.0–36.0)
MCV: 100.3 fL — ABNORMAL HIGH (ref 80.0–100.0)
Platelets: 305 K/uL (ref 150–400)
RBC: 3.02 MIL/uL — ABNORMAL LOW (ref 4.22–5.81)
RDW: 19.6 % — ABNORMAL HIGH (ref 11.5–15.5)
WBC: 7.6 K/uL (ref 4.0–10.5)
nRBC: 0 % (ref 0.0–0.2)

## 2023-10-17 LAB — RENAL FUNCTION PANEL
Albumin: 2.3 g/dL — ABNORMAL LOW (ref 3.5–5.0)
Anion gap: 10 (ref 5–15)
BUN: 39 mg/dL — ABNORMAL HIGH (ref 6–20)
CO2: 22 mmol/L (ref 22–32)
Calcium: 8.4 mg/dL — ABNORMAL LOW (ref 8.9–10.3)
Chloride: 99 mmol/L (ref 98–111)
Creatinine, Ser: 1.43 mg/dL — ABNORMAL HIGH (ref 0.61–1.24)
GFR, Estimated: 56 mL/min — ABNORMAL LOW (ref >60)
Glucose, Bld: 113 mg/dL — ABNORMAL HIGH (ref 70–99)
Phosphorus: 4.3 mg/dL (ref 2.5–4.6)
Potassium: 4.2 mmol/L (ref 3.5–5.1)
Sodium: 131 mmol/L — ABNORMAL LOW (ref 135–145)

## 2023-10-17 LAB — GLUCOSE, CAPILLARY
Glucose-Capillary: 303 mg/dL — ABNORMAL HIGH (ref 70–99)
Glucose-Capillary: 74 mg/dL (ref 70–99)
Glucose-Capillary: 164 mg/dL — ABNORMAL HIGH (ref 70–99)
Glucose-Capillary: 159 mg/dL — ABNORMAL HIGH (ref 70–99)

## 2023-10-17 LAB — VORICONAZOLE, SERUM: Voriconazole, Serum: 0.4 ug/mL

## 2023-10-17 LAB — OSMOLALITY: Osmolality: 284 mosm/kg (ref 275–295)

## 2023-10-17 MED ORDER — VORICONAZOLE 200 MG PO TABS
400.0000 mg | ORAL_TABLET | Freq: Every day | ORAL | Status: AC
  Administered 2023-10-17 – 2023-10-21 (×5): 400 mg via ORAL
  Filled 2023-10-17 (×5): qty 2

## 2023-10-17 MED ORDER — INSULIN GLARGINE-YFGN 100 UNIT/ML ~~LOC~~ SOLN
13.0000 [IU] | SUBCUTANEOUS | Status: DC
  Administered 2023-10-18 – 2023-11-04 (×21): 13 [IU] via SUBCUTANEOUS
  Filled 2023-10-17 (×18): qty 0.13

## 2023-10-17 MED ORDER — VORICONAZOLE 50 MG PO TABS
450.0000 mg | ORAL_TABLET | Freq: Every day | ORAL | Status: AC
  Administered 2023-10-18 – 2023-10-21 (×4): 450 mg via ORAL
  Filled 2023-10-17 (×4): qty 1

## 2023-10-17 NOTE — Plan of Care (Signed)

## 2023-10-17 NOTE — TOC Progression Note (Signed)
 Transition of Care (TOC) - Progression Note    Patient Details  Name: Frank [name]. MRN: [id number] Date of Birth: 13-Dec-1965  Transition of [facility]) CM/SW Contact  Isaiah Public, LCSWA Phone Number: 10/17/2023, 4:07 PM  Clinical Narrative:     CSW provided medicare compare list of accepted SNF bed offers to patient at bedside. CSW to follow back up with patient on SNF choice once patient able to review SNF bed offers. CSW will continue to follow.  Expected Discharge Plan: Skilled Nursing Facility Barriers to Discharge: Awaiting State Approval [name], SNF Pending bed offer, Continued Medical Work up, Air traffic controller and Services In-house Referral: Clinical Social Work Discharge Planning Services: [employer] Consult Post Acute Care Choice: Skilled Nursing Facility Living arrangements for the past 2 months: Single Family Home                 DME Arranged: N/A DME Agency: NA       HH Arranged: NA HH Agency: NA         Social Drivers of Health (SDOH) Interventions SDOH Screenings   Food Insecurity: No Food Insecurity (07/05/2023)  Housing: Low Risk  (07/05/2023)  Transportation Needs: No Transportation Needs (07/05/2023)  Utilities: Not At Risk (07/05/2023)  Tobacco Use: High Risk (10/13/2023)    Readmission Risk Interventions     No data to display

## 2023-10-17 NOTE — Progress Notes (Signed)
 So far, Mr. 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  He still has a drainage tube in.  Hopefully, this will be able to come out today or soon.  He still has a drainage catheter in the right hip area.  Hopefully this will come out next week.  He does have a Port-A-Cath in now.  This will make treatment a lot easier for him.  He is really motivated to do physical therapy.  He really wants to try harder so he can be more independent.  His labs show sodium 131.  Potassium 4.2.  BUN 39 creatinine 1.43.  Calcium  8.4 with albumin  of 2.3.  His white cell count is 7.6.  Hemoglobin 9.7.  Platelet count 35,000.  He is eating well.  There is no nausea or vomiting.  He has had no diarrhea.  He has had no bleeding.  He has had no rashes.  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  The legs do not appear to be swollen.  We will go ahead and drug treatment started again next week.  I really think we have to keep moving ahead with treatment.  He has had a very nice response so far.  Now that the Port-A-Cath is in, she may treatment a lot more manageable.  I know he is getting incredible care from everybody up on 5C.    Jeralyn Crease, MD  Inge 29:11

## 2023-10-17 NOTE — Progress Notes (Signed)
 Pharmacy Antibiotic Note  Frank [name]. is a 58 y.o. male admitted on 07/03/2023 with generalized weakness (multifactorial secondary to hypercalcemia, suspected malignancy, multiple myeloma).  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    Level history: 08/01/23 1.9 mcg/ml on 350 mg/12h 08/08/23 0.4 mcg/ml on 350 mg/12h (low due to tube feed binding) 08/19/23 1.3 mcg/ml continue 350 mg/12h 08/30/23 1.8 mcg/ml (drawn AFTER the dose) 08/31/23 0.6 mcg/ml drawn prior to the dose but ~2 hours late of true trough 09/08/23 0.7 mcg/ml drawn ~2 hours late of true trough and likely binding to bolus feeds 09/17/23 0.6 mcg/ml - drawn appropriately ~ 11 hours after previous dose   09/29/23 1.9 mcg/ml drawn appropriately  10/07/23 0.6 mcg/ml- drawn appropriately /slightly late about 12.5 hours after the last dose.  10/14/23 0.4 mcg/ml- Drawn ~ 1 hour late 13 hours after the dose and notably patient had missed a dose the day prior to the level  Patient is on supplements that are time out appropriately from voriconazole  but he may be taking them throughout the day.    Plan: - Change voriconazole  to 450 mg in the morning and 400 mg at night - keep at 1000/2200 - Will continue to monitor voriconazole  - Noted end date approaching on 8/5 - May need ID re-evaluation  Height: 6' (182.9 cm) Weight: 107 kg (235 lb 14.3 oz) IBW/kg (Calculated) : 77.6  Temp (24hrs), Avg:97.8 F (36.6 C), Min:97.4 F (36.3 C), Max:98.4 F (36.9 C)  Recent Labs  Lab 10/12/23 0533 10/13/23 0610 10/14/23 0452 10/15/23 0518 10/16/23 0525 10/17/23 0316  WBC 6.1 5.7 6.7 8.5 7.7 7.6  CREATININE 1.40*  --  1.63* 1.49* 1.42* 1.43*    Estimated Creatinine Clearance: 70.3 mL/min (A) (by C-G formula based on SCr of 1.43 mg/dL (H)).    Allergies  Allergen Reactions   Morphine  And Codeine Anaphylaxis   Shellfish Allergy Anaphylaxis   Betadine  [Povidone Iodine ]  Itching   Bupropion Other (See Comments)    Shaking of the body, hallucinations    Chlorhexidine  Hives    Patient reports never had issues CHG with mouth rinse.   Influenza Vaccines Hives   Metoprolol  Rash    Thank you for allowing pharmacy to be a part of this patient's care.  Damien Quiet, PharmD, BCPS, BCIDP Infectious Diseases Clinical Pharmacist Phone: [phone] 10/17/2023 2:51 PM   **Pharmacist phone directory can now be found on [email or web address] ([email or web address]).  Listed under [facility].

## 2023-10-17 NOTE — Progress Notes (Signed)
 [facility]                                                                              Frank [name], is a 58 y.o. male, DOB - 08-30-1965, FMW:[id number] Admit date - 07/03/2023    Outpatient Primary MD for the patient is [name], MD  LOS - 106  days  No chief complaint on file.      Brief summary   58 year old PMH OSA CPAP, pathological fracture and multiple ORIF left Humerus likely due to multiple myeloma, A-fib not on anticoagulation, hyperlipidemia, tobacco use disorder who was feeling weak for 1 week, admitted to [facility] with concern of sepsis, community-acquired pneumonia and acute encephalopathy. Patient was found to have lytic lesions, he was transferred to [facility] for oncology treatment. In the meantime he developed encephalopathy, acute renal failure. He remained in the ICU for 36 days. Underwent tracheostomy. He was also started on HD after CRRT,off of HD now. Subsequently PEG tube placed 5/29 and IR drain placement of right thigh hip infected hematoma on 7/11 . S/p PEG tube removal. Needs Port-A-Cath placement (for outpatient chemo), either during this hospitalization or as an outpatient. F/u IR recommendations for drain management. Started on heparin  drip on 7/17 because of b/l LE DVT.  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 on 7/28.  Surgical EBL 750 cc. Oncology and IR following.   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 - continue pain management, currently controlled. - Wound VAC LUE removed today -Per Ortho, dry dressing changes, continue eliquis    Infected right flank/gluteal hematoma -Underwent IR drain placement for infected Hematoma on 7/11.  -Drain exchanged on 7/25. -Completed antibiotic course on 7/26, cultures negative  - Continue  drain, IR following, planning repeat sclerotherapy on 8/7   Sepsis in the setting of Staph Epi Bacteremia  -blood culture with MRSE in 1 out of 4 bottles.  - Completed a course of vancomycin , 6/20, received a line holiday.   - Repeat blood culture 6/9 without growth.  Completed zyvox  on 7/26   Multiple myeloma:  - Started chemo with Velcade /Cytoxan  on 08/21/2023.  Chemo changed to Sarclisa /Kyprolis /Cytoxan  later on.  Currently on hold. -Myeloma responded well to treatment, IgG decreased from [phone] (normal range). -Oncology, Dr [name] following and planning to resume treatment next week - Port-A-Cath placed on 7/30 by IR   AKI on CKD-3A/uremia:  - due to MM, hypercalcemia and ACE inhibitors?  Briefly required hemodialysis.   - AKI resolved and TDC removed.  Cr started to trend up again likely for multiple myeloma. - Creatinine stable, 1.4, appears to be his baseline   BLE DVT:  - US  venous doppler on 7/16 positive for DVT in his RLE PTV, left CFV, SFJ, and PFV. - Continue Eliquis , held overnight for unclear reasons   Left sided pleuritic chest wall pain:   - No rib fractures seen on CXR - Pain control   Aspergillosis  with pneumonia  -Continue voriconazole .  End date on 8/5.   Acute Hypoxic Respiratory Failure -trach removed 6/11.  Decannulated.   Severe acute metabolic encephalopathy with hypoactive delirium, seizure disorder - Normal MRI of the brain.  LP negative for meningitis or encephalitis.  EEG negative for seizures but encephalopathy.  -Resolved   Acute postoperative blood loss anemia superimposed on anemia of renal disease:  - H/H stable    IDDM-2 with hyperglycemia - Hb A1c 6.0%. -Continue with sliding scale insulin .   CBG (last 3)  Recent Labs    10/16/23 1621 10/16/23 2026 10/17/23 0804  GLUCAP 136* 164* 303*   CBGs elevated this morning, increased glargine to 13 units daily Continue moderate SSI, NovoLog  2 units 3 times daily AC    Paroxysmal  A-fib:: Rate controlled. -Continue verapamil  - Continue Eliquis  for anticoagulation.   Essential hypertension: Normotensive. - Continue verapamil    Obstructive Sleep Apnea  - Trach removed. CPAP    Anxiety/Bipolar disorder  -  Continue Paxil .     Hypokalemia/hyperkalemia: K5.8. - Potassium stable   Hyponatremia:  - Obtain serum osmolarity, urine osmolarity, UNA for further workup -Continue to monitor   Dysphagia:  - Resolved.  PEG tube removed.  On regular diet.    RN Pressure Injury Documentation: Pressure Injury 07/05/23 Heel Left;Right Unstageable - Full thickness tissue loss in which the base of the injury is covered by slough (yellow, tan, gray, green or brown) and/or eschar (tan, brown or black) in the wound bed. (Active)  07/05/23 1108  Location: Heel  Location Orientation: Left;Right  Staging: Unstageable - Full thickness tissue loss in which the base of the injury is covered by slough (yellow, tan, gray, green or brown) and/or eschar (tan, brown or black) in the wound bed.  Wound Description (Comments):   DO NOT USE:  Present on Admission: Yes  Dressing Type None 10/12/23 1935     Pressure Injury 07/11/23 Sacrum Mid Unstageable - Full thickness tissue loss in which the base of the injury is covered by slough (yellow, tan, gray, green or brown) and/or eschar (tan, brown or black) in the wound bed. (Active)  07/11/23 1600  Location: Sacrum  Location Orientation: Mid  Staging: Unstageable - Full thickness tissue loss in which the base of the injury is covered by slough (yellow, tan, gray, green or brown) and/or eschar (tan, brown or black) in the wound bed.  Wound Description (Comments):   DO NOT USE:  Present on Admission: No  Dressing Type Gauze (Comment);Santyl ;Barrier Film (skin prep);Foam - Lift dressing to assess site every shift 10/07/23 1400     Pressure Injury 07/27/23 Buttocks Right;Lower (Active)  07/27/23 1700  Location: Buttocks  Location Orientation:  Right;Lower  Staging:   Wound Description (Comments):   DO NOT USE:  Present on Admission: Yes  Dressing Type None 10/13/23 0515     Pressure Injury 08/25/23 Buttocks Left Stage 2 -  Partial thickness loss of dermis presenting as a shallow open injury with a red, pink wound bed without slough. (Active)  08/25/23   Location: Buttocks  Location Orientation: Left  Staging: Stage 2 -  Partial thickness loss of dermis presenting as a shallow open injury with a red, pink wound bed without slough.  Wound Description (Comments):   DO NOT USE:  Present on Admission: No  Dressing Type None 10/13/23 0515    Severe protein calorie malnutrition  Nutrition Problem: Severe Malnutrition Etiology: acute illness (prolonged illness, interuptions in feeding) Signs/Symptoms: moderate fat depletion,  moderate muscle depletion, percent weight loss (9.6% x 1 month) Percent weight loss: 9.6 % Interventions: Ensure Enlive (each supplement provides 350kcal and 20 grams of protein), MVI, Liberalize Diet, Prostat, Juven  Obesity class I Estimated body mass index is 31.99 kg/m as calculated from the following:   Height as of this encounter: 6' (1.829 m).   Weight as of this encounter: 107 kg.  Code Status: full  DVT Prophylaxis:  SCDs Start: 10/13/23 1123 Place and maintain sequential compression device Start: 07/14/23 1033 apixaban  (ELIQUIS ) tablet 5 mg   Level of Care: Level of care: Telemetry Medical Family Communication:  Disposition Plan:      Remains inpatient appropriate:      Procedures:    Consultants:     Antimicrobials:   Anti-infectives (From admission, onward)    Start     Dose/Rate Route Frequency Ordered Stop   10/23/23 0800  doxycycline  200mg  in 20 mL SWI irrigation / sclerosing solution for IR       Note to Pharmacy: TO IR ON 8/7   200 mg Irrigation To Radiology 10/16/23 1257 10/24/23 0800   10/23/23 0800  doxycycline  200mg  in 20 mL SWI irrigation / sclerosing solution for IR        Note to Pharmacy: TO IR ON 8/7 FOR PROCEDURE   200 mg Irrigation To Radiology 10/16/23 1258 10/24/23 0800   10/13/23 1400  ceFAZolin  (ANCEF ) IVPB 2g/100 mL premix        2 g 200 mL/hr over 30 Minutes Intravenous Every 8 hours 10/13/23 1254 10/14/23 0618   10/13/23 0937  vancomycin  (VANCOCIN ) powder  Status:  Discontinued          As needed 10/13/23 0937 10/13/23 1002   10/10/23 0900  doxycycline  200mg  in 20 mL SWI irrigation / sclerosing solution for IR       Note to Pharmacy: For irrigation only. WARNING - SCLEROSING AGENT  #5 vials sent to IR for procedure   500 mg Irrigation To Radiology 10/07/23 1418 10/10/23 1015   10/10/23 0000  doxycycline  (VIBRAMYCIN ) 500 mg in dextrose  5 % 250 mL IVPB  Status:  Discontinued        500 mg 125 mL/hr over 120 Minutes Intravenous To Radiology 10/07/23 1402 10/07/23 1409   10/10/23 0000  doxycycline  200mg  in 20 mL SWI irrigation / sclerosing solution for IR  Status:  Discontinued        200 mg Irrigation To Radiology 10/07/23 1418 10/07/23 1456   10/10/23 0000  doxycycline  200mg  in 20 mL SWI irrigation / sclerosing solution for IR  Status:  Discontinued        200 mg Irrigation To Radiology 10/07/23 1440 10/07/23 1456   10/02/23 2200  linezolid  (ZYVOX ) tablet 600 mg        600 mg Oral Every 12 hours 10/01/23 0852 10/09/23 2108   10/02/23 1000  linezolid  (ZYVOX ) tablet 600 mg  Status:  Discontinued        600 mg Oral Every 12 hours 10/01/23 0723 10/01/23 0852   09/30/23 1300  vancomycin  variable dose per unstable renal function (pharmacist dosing)  Status:  Discontinued         Does not apply See admin instructions 09/30/23 1301 10/01/23 0725   09/29/23 1145  amoxicillin -clavulanate (AUGMENTIN ) 875-125 MG per tablet 1 tablet        1 tablet Oral Every 12 hours 09/29/23 1047 10/09/23 2108   09/27/23 0200  vancomycin  (VANCOREADY) IVPB 1500 mg/300 mL  1,500 mg 150 mL/hr over 120 Minutes Intravenous Every 12 hours 09/26/23 1402 09/30/23 1448    09/26/23 1500  cefTRIAXone  (ROCEPHIN ) 2 g in sodium chloride  0.9 % 100 mL IVPB  Status:  Discontinued        2 g 200 mL/hr over 30 Minutes Intravenous Every 24 hours 09/26/23 1402 09/29/23 1047   09/26/23 1315  vancomycin  (VANCOREADY) IVPB 2000 mg/400 mL        2,000 mg 200 mL/hr over 120 Minutes Intravenous  Once 09/26/23 1220 09/26/23 1656   09/26/23 1230  metroNIDAZOLE  (FLAGYL ) IVPB 500 mg  Status:  Discontinued        500 mg 100 mL/hr over 60 Minutes Intravenous Every 12 hours 09/26/23 1137 09/29/23 1138   09/22/23 2200  voriconazole  (VFEND ) tablet 400 mg        400 mg Oral Every 12 hours 09/22/23 1521 10/21/23 2359   09/11/23 2200  acyclovir  (ZOVIRAX ) 200 MG capsule 400 mg       Note to Pharmacy: Give after dialysis the pm dose, on dialysis days   400 mg Oral 2 times daily 09/11/23 1551     09/11/23 2200  voriconazole  (VFEND ) tablet 350 mg  Status:  Discontinued        350 mg Oral Every 12 hours 09/11/23 1551 09/22/23 1521   09/07/23 1000  acyclovir  (ZOVIRAX ) 200 MG capsule 400 mg  Status:  Discontinued       Note to Pharmacy: Give after dialysis the pm dose, on dialysis days   400 mg Per Tube 2 times daily 09/07/23 0825 09/11/23 1551   09/03/23 1200  vancomycin  (VANCOCIN ) IVPB 1000 mg/200 mL premix  Status:  Discontinued        1,000 mg 200 mL/hr over 60 Minutes Intravenous Every M-W-F (Hemodialysis) 09/02/23 1021 09/03/23 0942   09/03/23 0942  vancomycin  variable dose per unstable renal function (pharmacist dosing)  Status:  Discontinued         Does not apply See admin instructions 09/03/23 0942 09/03/23 0945   09/02/23 0915  vancomycin  (VANCOCIN ) IVPB 1000 mg/200 mL premix        1,000 mg 200 mL/hr over 60 Minutes Intravenous  Once 09/02/23 0830 09/02/23 1215   09/01/23 0834  ceFAZolin  (ANCEF ) IVPB 2g/100 mL premix        over 30 Minutes Intravenous Continuous PRN 09/01/23 0834 09/01/23 0834   08/29/23 0914  vancomycin  variable dose per unstable renal function (pharmacist  dosing)  Status:  Discontinued         Does not apply See admin instructions 08/29/23 0914 09/02/23 1021   08/27/23 1400  vancomycin  (VANCOCIN ) IVPB 1000 mg/200 mL premix        1,000 mg 200 mL/hr over 60 Minutes Intravenous  Once 08/27/23 0920 08/27/23 1653   08/25/23 1200  vancomycin  (VANCOCIN ) IVPB 1000 mg/200 mL premix        1,000 mg 200 mL/hr over 60 Minutes Intravenous Every M-W-F (Hemodialysis) 08/24/23 1405 08/29/23 1240   08/24/23 1445  vancomycin  (VANCOREADY) IVPB 2000 mg/400 mL        2,000 mg 200 mL/hr over 120 Minutes Intravenous  Once 08/24/23 1353 08/25/23 0323   08/14/23 0600  ceFAZolin  (ANCEF ) IVPB 2g/100 mL premix        2 g 200 mL/hr over 30 Minutes Intravenous To Radiology 08/13/23 1055 08/14/23 0606   08/09/23 1000  acyclovir  (ZOVIRAX ) 200 MG capsule 200 mg  Status:  Discontinued       Note  to Pharmacy: Give after dialysis the pm dose, on dialysis days   200 mg Per Tube 2 times daily 08/08/23 2358 09/07/23 0825   08/09/23 0100  acyclovir  (ZOVIRAX ) 200 MG capsule 200 mg       Note to Pharmacy: Give after dialysis the pm dose, on dialysis days   200 mg Per Tube  Once 08/09/23 0009 08/09/23 0013   08/08/23 2345  acyclovir  (ZOVIRAX ) 200 MG capsule 200 mg  Status:  Discontinued       Note to Pharmacy: Give after dialysis the pm dose, on dialysis days   200 mg Oral 2 times daily 08/08/23 2257 08/08/23 2358   08/07/23 1530  ceFAZolin  (ANCEF ) IVPB 2g/100 mL premix        over 30 Minutes Intravenous Continuous PRN 08/07/23 1555 08/07/23 1530   08/05/23 2200  voriconazole  (VFEND ) tablet 350 mg  Status:  Discontinued        350 mg Per Tube Every 12 hours 08/05/23 1511 09/11/23 1551   08/01/23 1200  vancomycin  (VANCOCIN ) IVPB 1000 mg/200 mL premix        1,000 mg 200 mL/hr over 60 Minutes Intravenous Every M-W-F (Hemodialysis) 07/31/23 0649 08/01/23 1652   07/30/23 1200  vancomycin  (VANCOCIN ) IVPB 1000 mg/200 mL premix        1,000 mg 200 mL/hr over 60 Minutes Intravenous  Every M-W-F (Hemodialysis) 07/30/23 0851 07/30/23 1241   07/29/23 0900  vancomycin  (VANCOCIN ) IVPB 1000 mg/200 mL premix        1,000 mg 200 mL/hr over 60 Minutes Intravenous  Once 07/29/23 0812 07/29/23 0943   07/28/23 1204  vancomycin  variable dose per unstable renal function (pharmacist dosing)  Status:  Discontinued         Does not apply See admin instructions 07/28/23 1204 07/31/23 0649   07/28/23 1000  voriconazole  (VFEND ) 350 mg in sodium chloride  0.9 % 100 mL IVPB  Status:  Discontinued       Placed in Followed by Linked Group   4 mg/kg  88.6 kg (Adjusted) 67.5 mL/hr over 120 Minutes Intravenous Every 12 hours 07/28/23 0748 08/05/23 1511   07/27/23 1000  voriconazole  (VFEND ) 400 mg in sodium chloride  0.9 % 100 mL IVPB  Status:  Discontinued       Placed in Followed by Linked Group   4 mg/kg  100.6 kg 70 mL/hr over 120 Minutes Intravenous Every 12 hours 07/26/23 0826 07/28/23 0748   07/27/23 0945  vancomycin  (VANCOREADY) IVPB 2000 mg/400 mL        2,000 mg 200 mL/hr over 120 Minutes Intravenous  Once 07/27/23 0849 07/27/23 1237   07/26/23 1515  Ampicillin -Sulbactam (UNASYN ) 3 g in sodium chloride  0.9 % 100 mL IVPB  Status:  Discontinued        3 g 200 mL/hr over 30 Minutes Intravenous Every 8 hours 07/26/23 1418 07/27/23 0830   07/26/23 0915  voriconazole  (VFEND ) 600 mg in sodium chloride  0.9 % 150 mL IVPB       Placed in Followed by Linked Group   6 mg/kg  100.6 kg 105 mL/hr over 120 Minutes Intravenous Every 12 hours 07/26/23 0826 07/27/23 0048   07/26/23 0900  Ampicillin -Sulbactam (UNASYN ) 3 g in sodium chloride  0.9 % 100 mL IVPB  Status:  Discontinued        3 g 200 mL/hr over 30 Minutes Intravenous Every 12 hours 07/26/23 0826 07/26/23 1418   07/23/23 1000  levofloxacin  (LEVAQUIN ) IVPB 500 mg  Status:  Discontinued  Placed in Followed by Linked Group   500 mg 100 mL/hr over 60 Minutes Intravenous Every 24 hours 07/22/23 1213 07/23/23 1001   07/23/23 1000   levofloxacin  (LEVAQUIN ) IVPB 500 mg  Status:  Discontinued       Placed in Followed by Linked Group   500 mg 100 mL/hr over 60 Minutes Intravenous Every 24 hours 07/23/23 1001 07/28/23 0804   07/22/23 1300  levofloxacin  (LEVAQUIN ) IVPB 750 mg       Placed in Followed by Linked Group   750 mg 100 mL/hr over 90 Minutes Intravenous  Once 07/22/23 1213 07/22/23 1542   07/21/23 1600  voriconazole  (VFEND ) 300 mg in sodium chloride  0.9 % 100 mL IVPB  Status:  Discontinued        300 mg 65 mL/hr over 120 Minutes Intravenous Every 12 hours 07/21/23 1430 07/22/23 1415   07/21/23 1000  voriconazole  (VFEND ) tablet 200 mg  Status:  Discontinued        200 mg Oral Every 12 hours 07/20/23 0944 07/21/23 1015   07/20/23 1030  voriconazole  (VFEND ) 530 mg in sodium chloride  0.9 % 150 mL IVPB        6 mg/kg  88.6 kg (Adjusted) 101.5 mL/hr over 120 Minutes Intravenous Every 12 hours 07/20/23 0944 07/21/23 0030   07/19/23 1400  meropenem  (MERREM ) 1 g in sodium chloride  0.9 % 100 mL IVPB  Status:  Discontinued        1 g 200 mL/hr over 30 Minutes Intravenous Every 8 hours 07/19/23 1143 07/22/23 1213   07/18/23 0600  meropenem  (MERREM ) 1 g in sodium chloride  0.9 % 100 mL IVPB  Status:  Discontinued        1 g 200 mL/hr over 30 Minutes Intravenous Every 24 hours 07/17/23 0916 07/19/23 1143   07/17/23 0700  meropenem  (MERREM ) 1 g in sodium chloride  0.9 % 100 mL IVPB  Status:  Discontinued        1 g 200 mL/hr over 30 Minutes Intravenous Every 12 hours 07/17/23 0601 07/17/23 0916   07/15/23 1300  vancomycin  (VANCOCIN ) IVPB 1000 mg/200 mL premix  Status:  Discontinued        1,000 mg 200 mL/hr over 60 Minutes Intravenous Every 24 hours 07/14/23 1129 07/22/23 1319   07/14/23 1215  vancomycin  (VANCOREADY) IVPB 1750 mg/350 mL        1,750 mg 175 mL/hr over 120 Minutes Intravenous  Once 07/14/23 1129 07/14/23 1449   07/09/23 1800  piperacillin -tazobactam (ZOSYN ) IVPB 3.375 g  Status:  Discontinued        3.375  g 12.5 mL/hr over 240 Minutes Intravenous Every 12 hours 07/09/23 0756 07/09/23 0844   07/09/23 1400  piperacillin -tazobactam (ZOSYN ) IVPB 3.375 g  Status:  Discontinued        3.375 g 12.5 mL/hr over 240 Minutes Intravenous Every 8 hours 07/09/23 0844 07/17/23 0557   07/08/23 1300  piperacillin -tazobactam (ZOSYN ) IVPB 3.375 g  Status:  Discontinued        3.375 g 12.5 mL/hr over 240 Minutes Intravenous Every 8 hours 07/08/23 1129 07/09/23 0756   07/05/23 2200  azithromycin  (ZITHROMAX ) 500 mg in sodium chloride  0.9 % 250 mL IVPB        500 mg 250 mL/hr over 60 Minutes Intravenous Daily at bedtime 07/05/23 2022 07/06/23 2302   07/05/23 2115  azithromycin  (ZITHROMAX ) 250 mg in dextrose  5 % 125 mL IVPB  Status:  Discontinued       Note to Pharmacy: Missed dose  this morning   250 mg 127.5 mL/hr over 60 Minutes Intravenous Every 24 hours 07/05/23 2016 07/05/23 2021   07/04/23 0900  fluconazole  (DIFLUCAN ) IVPB 200 mg  Status:  Discontinued        200 mg 100 mL/hr over 60 Minutes Intravenous Every 48 hours 07/04/23 0722 07/09/23 0809   07/03/23 0600  cefTRIAXone  (ROCEPHIN ) 1 g in sodium chloride  0.9 % 100 mL IVPB  Status:  Discontinued        1 g 200 mL/hr over 30 Minutes Intravenous Every 24 hours 07/03/23 0508 07/08/23 1104   07/03/23 0600  azithromycin  (ZITHROMAX ) tablet 250 mg  Status:  Discontinued        250 mg Oral Daily 07/03/23 0508 07/05/23 2016          Medications  (feeding supplement) PROSource Plus  30 mL Oral TID BM   acyclovir   400 mg Oral BID   apixaban   5 mg Oral BID   collagenase    Topical Daily   docusate sodium   100 mg Oral BID   [START ON 10/23/2023] doxycycline  200mg  in 20 mL SWI irrigation / sclerosing solution for IR  200 mg Irrigation to XRAY   [START ON 10/23/2023] doxycycline  200mg  in 20 mL SWI irrigation / sclerosing solution for IR  200 mg Irrigation to XRAY   epoetin  alfa  40,000 Units Subcutaneous Q Wed-1800   feeding supplement  237 mL Oral BID WC    guaiFENesin   600 mg Oral BID   heparin  lock flush  500 Units Intravenous Once   hydrALAZINE   25 mg Oral Q8H   insulin  aspart  0-15 Units Subcutaneous TID AC & HS   insulin  aspart  2 Units Subcutaneous TID AC & HS   insulin  glargine-yfgn  10 Units Subcutaneous Q24H   lidocaine   1 patch Transdermal Q24H   montelukast   5 mg Oral QHS   multivitamin with minerals  1 tablet Oral Daily   nutrition supplement (JUVEN)  1 packet Oral BID AC   mouth rinse  15 mL Mouth Rinse 4 times per day   pantoprazole   40 mg Oral BID   PARoxetine   40 mg Oral Daily   polyethylene glycol  17 g Oral BID   senna  1 tablet Oral Daily   umeclidinium-vilanterol  1 puff Inhalation Daily   valproic  acid  750 mg Oral BID   verapamil   120 mg Oral Daily   voriconazole   400 mg Oral Q12H      Subjective:   Frank [name] was seen and examined today.  Doing well, wound VAC from LUE removed, dressing based by Ortho.  Drain right hip+, pain controlled.   patient denies dizziness, chest pain, shortness of breath, abdominal pain, N/V.   Objective:   Vitals:   10/16/23 1621 10/16/23 2023 10/17/23 0405 10/17/23 0759  BP: 110/81 127/89 129/85 (!) 124/94  Pulse: (!) 102 (!) 101 95 (!) 101  Resp:  18 18 16   Temp: (!) 97.4 F (36.3 C) 98.4 F (36.9 C) 97.9 F (36.6 C) (!) 97.4 F (36.3 C)  TempSrc: Oral Oral Oral   SpO2: 97% 97% 96% 95%  Weight:      Height:        Intake/Output Summary (Last 24 hours) at 10/17/2023 1027 Last data filed at 10/17/2023 0915 Gross per 24 hour  Intake 413 ml  Output 3550 ml  Net -3137 ml     Wt Readings from Last 3 Encounters:  10/16/23 107 kg  06/05/23 113.7  kg  01/29/23 117.9 kg   Physical Exam General: Alert and oriented x 3, NAD Cardiovascular: S1 S2 clear, RRR.  Respiratory: CTAB, no wheezing Gastrointestinal: Soft, nontender, nondistended, NBS Ext: no pedal edema bilaterally, LUE dressing intact, wound VAC removed today.  Right hip drain+ Neuro: no new deficits Psych:  Normal affect      Data Reviewed:  I have personally reviewed following labs    CBC Lab Results  Component Value Date   WBC 7.6 10/17/2023   RBC 3.02 (L) 10/17/2023   HGB 9.7 (L) 10/17/2023   HCT 30.3 (L) 10/17/2023   MCV 100.3 (H) 10/17/2023   MCH 32.1 10/17/2023   PLT 305 10/17/2023   MCHC 32.0 10/17/2023   RDW 19.6 (H) 10/17/2023   LYMPHSABS 1.2 10/06/2023   MONOABS 0.9 10/06/2023   EOSABS 0.1 10/06/2023   BASOSABS 0.0 10/06/2023     Last metabolic panel Lab Results  Component Value Date   NA 131 (L) 10/17/2023   K 4.2 10/17/2023   CL 99 10/17/2023   CO2 22 10/17/2023   BUN 39 (H) 10/17/2023   CREATININE 1.43 (H) 10/17/2023   GLUCOSE 113 (H) 10/17/2023   GFRNONAA 56 (L) 10/17/2023   GFRAA >60 12/27/2017   CALCIUM  8.4 (L) 10/17/2023   PHOS 4.3 10/17/2023   PROT 5.5 (L) 10/07/2023   ALBUMIN  2.3 (L) 10/17/2023   LABGLOB 2.6 09/12/2023   AGRATIO 0.9 09/12/2023   BILITOT 0.5 10/07/2023   ALKPHOS 83 10/07/2023   AST 9 (L) 10/07/2023   ALT 5 10/07/2023   ANIONGAP 10 10/17/2023    CBG (last 3)  Recent Labs    10/16/23 1621 10/16/23 2026 10/17/23 0804  GLUCAP 136* 164* 303*      Coagulation Profile: No results for input(s): INR, PROTIME in the last 168 hours.   Radiology Studies: I have personally reviewed the imaging studies  IR IMAGING GUIDED PORT INSERTION Result Date: 10/15/2023 INDICATION: 58 year old male with history multiple myeloma requiring central venous access for chemotherapy administration EXAM: IMPLANTED PORT A CATH PLACEMENT WITH ULTRASOUND AND FLUOROSCOPIC GUIDANCE COMPARISON:  None Available. MEDICATIONS: None. ANESTHESIA/SEDATION: Moderate (conscious) sedation was employed during this procedure. A total of Versed  1.5 mg and Fentanyl  75 mcg was administered intravenously. Moderate Sedation Time: 14 minutes. The patient's level of consciousness and vital signs were monitored continuously by radiology nursing throughout the procedure  under my direct supervision. CONTRAST:  None FLUOROSCOPY TIME:  Ten mGy reference air kerma COMPLICATIONS: None immediate. PROCEDURE: The procedure, risks, benefits, and alternatives were explained to the patient. Questions regarding the procedure were encouraged and answered. The patient understands and consents to the procedure. The right neck and chest were prepped with chlorhexidine  in a sterile fashion, and a sterile drape was applied covering the operative field. Maximum barrier sterile technique with sterile gowns and gloves were used for the procedure. A timeout was performed prior to the initiation of the procedure. Ultrasound was used to examine the jugular vein which was compressible and free of internal echoes. A skin marker was used to demarcate the planned venotomy and port pocket incision sites. Local anesthesia was provided to these sites and the subcutaneous tunnel track with 1% lidocaine  with 1:100,000 epinephrine . A small incision was created at the jugular access site and blunt dissection was performed of the subcutaneous tissues. Under ultrasound guidance, the jugular vein was accessed with a 21 ga micropuncture needle and an 0.018 wire was inserted to the superior vena cava. Real-time ultrasound guidance was utilized  for vascular access including the acquisition of a permanent ultrasound image documenting patency of the accessed vessel. A 5 Fr micopuncture set was then used, through which a 0.035 Rosen wire was passed under fluoroscopic guidance into the inferior vena cava. An 8 Fr dilator was then placed over the wire. A subcutaneous port pocket was then created along the upper chest wall utilizing a combination of sharp and blunt dissection. The pocket was irrigated with sterile saline, packed with gauze, and observed for hemorrhage. A single lumen ISP sized power injectable port was chosen for placement. The 8 Fr catheter was tunneled from the port pocket site to the venotomy incision.  The port was placed in the pocket. The external catheter was trimmed to appropriate length. The dilator was exchanged for an 8 Fr peel-away sheath under fluoroscopic guidance. The catheter was then placed through the sheath and the sheath was removed. Final catheter positioning was confirmed and documented with a fluoroscopic spot radiograph. The port was accessed with a Huber needle, aspirated, and flushed with heparinized saline. The deep dermal layer of the port pocket incision was closed with interrupted 3-0 Vicryl suture. Dermabond was then placed over the port pocket and neck incisions. The patient tolerated the procedure well without immediate post procedural complication. FINDINGS: After catheter placement, the tip lies within the superior cavoatrial junction. The catheter aspirates and flushes normally and is ready for immediate use. IMPRESSION: Successful placement of a power injectable Port-A-Cath via the right internal jugular vein. The catheter is ready for immediate use. Ester Sides, MD [facility] Electronically Signed   By: Ester Sides M.D.   On: 10/15/2023 16:30       Paarth Cropper M.D. [facility] 10/17/2023, 10:27 AM  Available via Epic secure chat 7am-7pm After 7 pm, please refer to night coverage provider listed on amion.

## 2023-10-17 NOTE — Progress Notes (Signed)
 Orthopaedic Trauma Progress Note  SUBJECTIVE: Doing okay today.  Notes some pain in the left forearm as expected.  Due for some pain medication now.  Notes the most tender area is in the axilla.  Got a Port-A-Cath on Wednesday.  No other specific issues of note.  Intraoperative cultures from the left humerus with no growth to date.  Surgical pathology consistent with patient's known myeloma  OBJECTIVE:  Vitals:   10/17/23 0405 10/17/23 0759  BP: 129/85 (!) 124/94  Pulse: 95 (!) 101  Resp: 18 16  Temp: 97.9 F (36.6 C) (!) 97.4 F (36.3 C)  SpO2: 96% 95%    Opiates Today (MME): Today's  total administered Morphine  Milligram Equivalents: 40 Opiates Yesterday (MME): Yesterday's total administered Morphine  Milligram Equivalents: 80  General: Sitting up in bed comfortably.  No acute distress.   Respiratory: No increased work of breathing.  Operative Extremity (LUE): Dressing clean, dry, intact.  Incisional VAC with good seal and function, no output in the canister.  Incisional VAC removed.  Incision stable, wound edges well-approximated.  Nontender over the shoulder.   LABS:  Results for orders placed or performed during the hospital encounter of 07/03/23 (from the past 24 hours)  Glucose, capillary     Status: Abnormal   Collection Time: 10/16/23 11:58 AM  Result Value Ref Range   Glucose-Capillary 119 (H) 70 - 99 mg/dL  Glucose, capillary     Status: Abnormal   Collection Time: 10/16/23  4:21 PM  Result Value Ref Range   Glucose-Capillary 136 (H) 70 - 99 mg/dL  Glucose, capillary     Status: Abnormal   Collection Time: 10/16/23  8:26 PM  Result Value Ref Range   Glucose-Capillary 164 (H) 70 - 99 mg/dL  Osmolality     Status: None   Collection Time: 10/17/23  3:16 AM  Result Value Ref Range   Osmolality 284 275 - 295 mOsm/kg  CBC     Status: Abnormal   Collection Time: 10/17/23  3:16 AM  Result Value Ref Range   WBC 7.6 4.0 - 10.5 K/uL   RBC 3.02 (L) 4.22 - 5.81 MIL/uL    Hemoglobin 9.7 (L) 13.0 - 17.0 g/dL   HCT 69.6 (L) 60.9 - 47.9 %   MCV 100.3 (H) 80.0 - 100.0 fL   MCH 32.1 26.0 - 34.0 pg   MCHC 32.0 30.0 - 36.0 g/dL   RDW 80.3 (H) 88.4 - 84.4 %   Platelets 305 150 - 400 K/uL   nRBC 0.0 0.0 - 0.2 %  Renal function panel     Status: Abnormal   Collection Time: 10/17/23  3:16 AM  Result Value Ref Range   Sodium 131 (L) 135 - 145 mmol/L   Potassium 4.2 3.5 - 5.1 mmol/L   Chloride 99 98 - 111 mmol/L   CO2 22 22 - 32 mmol/L   Glucose, Bld 113 (H) 70 - 99 mg/dL   BUN 39 (H) 6 - 20 mg/dL   Creatinine, Ser 8.56 (H) 0.61 - 1.24 mg/dL   Calcium  8.4 (L) 8.9 - 10.3 mg/dL   Phosphorus 4.3 2.5 - 4.6 mg/dL   Albumin  2.3 (L) 3.5 - 5.0 g/dL   GFR, Estimated 56 (L) >60 mL/min   Anion gap 10 5 - 15  Glucose, capillary     Status: Abnormal   Collection Time: 10/17/23  8:04 AM  Result Value Ref Range   Glucose-Capillary 303 (H) 70 - 99 mg/dL    ASSESSMENT: Frank [name]  Frank [name]. is a 58 y.o. male, 4 Days Post-Op s/p LEFT TRANSHUMERAL AMPUTATION   CV/Blood loss: Acute blood loss anemia, Hgb 9.7 this AM.  Hemodynamically stable  PLAN: Weightbearing: NWB LUE ROM: Unrestricted motion as tolerated Incisional and dressing care: Changed LUE dressing as needed Showering: Okay for bed bath.  Would hold off getting LUE incision wet until 10/21/2023 as long as there is no drainage  Orthopedic device(s): Wound VAC LUE removed today. Pain management: Continue current multimodal regimen VTE treatment: Eliquis .  Continue SCDs ID: Per infectious disease Foley/Lines:  No foley, KVO IVFs Dispo: PT/OT as able.  Continue care per primary team and oncology.   Follow - up plan: 2 weeks after d/c for wound check    Contact information:  [name], [name] PA-C. After hours and holidays please check [email or web address] for group call information for [facility]   [name], PA-C [phone] (office) [email or web address]

## 2023-10-17 NOTE — Plan of Care (Signed)
 Problem: Education: Goal: Knowledge of General Education information will improve Description: Including pain rating scale, medication(s)/side effects and non-pharmacologic comfort measures Outcome: Progressing   Problem: Health Behavior/Discharge Planning: Goal: Ability to manage health-related needs will improve Outcome: Progressing   Problem: Clinical Measurements: Goal: Ability to maintain clinical measurements within normal limits will improve Outcome: Progressing Goal: Will remain free from infection Outcome: Progressing Goal: Diagnostic test results will improve Outcome: Progressing Goal: Respiratory complications will improve Outcome: Progressing Goal: Cardiovascular complication will be avoided Outcome: Progressing   Problem: Activity: Goal: Risk for activity intolerance will decrease Outcome: Progressing   Problem: Nutrition: Goal: Adequate nutrition will be maintained Outcome: Progressing   Problem: Coping: Goal: Level of anxiety will decrease Outcome: Progressing   Problem: Elimination: Goal: Will not experience complications related to bowel motility Outcome: Progressing Goal: Will not experience complications related to urinary retention Outcome: Progressing   Problem: Pain Managment: Goal: General experience of comfort will improve and/or be controlled Outcome: Progressing   Problem: Safety: Goal: Ability to remain free from injury will improve Outcome: Progressing   Problem: Skin Integrity: Goal: Risk for impaired skin integrity will decrease Outcome: Progressing   Problem: Education: Goal: Ability to describe self-care measures that may prevent or decrease complications (Diabetes Survival Skills Education) will improve Outcome: Progressing Goal: Individualized Educational Video(s) Outcome: Progressing   Problem: Coping: Goal: Ability to adjust to condition or change in health will improve Outcome: Progressing   Problem: Fluid  Volume: Goal: Ability to maintain a balanced intake and output will improve Outcome: Progressing   Problem: Health Behavior/Discharge Planning: Goal: Ability to identify and utilize available resources and services will improve Outcome: Progressing Goal: Ability to manage health-related needs will improve Outcome: Progressing   Problem: Metabolic: Goal: Ability to maintain appropriate glucose levels will improve Outcome: Progressing   Problem: Nutritional: Goal: Maintenance of adequate nutrition will improve Outcome: Progressing Goal: Progress toward achieving an optimal weight will improve Outcome: Progressing   Problem: Skin Integrity: Goal: Risk for impaired skin integrity will decrease Outcome: Progressing   Problem: Tissue Perfusion: Goal: Adequacy of tissue perfusion will improve Outcome: Progressing   Problem: Education: Goal: Knowledge about tracheostomy care/management will improve Outcome: Progressing   Problem: Activity: Goal: Ability to tolerate increased activity will improve Outcome: Progressing   Problem: Health Behavior/Discharge Planning: Goal: Ability to manage tracheostomy will improve Outcome: Progressing   Problem: Education: Goal: Knowledge of General Education information will improve Description: Including pain rating scale, medication(s)/side effects and non-pharmacologic comfort measures Outcome: Progressing   Problem: Health Behavior/Discharge Planning: Goal: Ability to manage health-related needs will improve Outcome: Progressing   Problem: Clinical Measurements: Goal: Ability to maintain clinical measurements within normal limits will improve Outcome: Progressing Goal: Will remain free from infection Outcome: Progressing Goal: Diagnostic test results will improve Outcome: Progressing Goal: Respiratory complications will improve Outcome: Progressing Goal: Cardiovascular complication will be avoided Outcome: Progressing    Problem: Activity: Goal: Risk for activity intolerance will decrease Outcome: Progressing   Problem: Nutrition: Goal: Adequate nutrition will be maintained Outcome: Progressing   Problem: Coping: Goal: Level of anxiety will decrease Outcome: Progressing   Problem: Elimination: Goal: Will not experience complications related to bowel motility Outcome: Progressing Goal: Will not experience complications related to urinary retention Outcome: Progressing   Problem: Pain Managment: Goal: General experience of comfort will improve and/or be controlled Outcome: Progressing   Problem: Safety: Goal: Ability to remain free from injury will improve Outcome: Progressing   Problem: Skin Integrity: Goal: Risk for impaired  skin integrity will decrease Outcome: Progressing

## 2023-10-17 NOTE — Progress Notes (Signed)
 Occupational Therapy Treatment Patient Details Name: Frank [name]. MRN: [id number] DOB: 28-Sep-1965 Today's Date: 10/17/2023   History of present illness 58 y.o. male transferred from [facility] 07/03/23 to [facility] for management of newly diagnosed plasma cell myeloma with weakness and AMS. Pt also with AKI and metabolic encephalopathy. 5/10 transfer to [facility] for iHD. 4/22 Intubated and chemo initiated. 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 5/6 IVIG started. 5/8-6/11 trach. 5/12 iHD initiated. 5/29 PEG placed. 6/5 chemo initiated. PMHx:Lt THA, carpal tunnel syndrome, Lt TKA, Lt humerus fx, PAF, HTN, HLD, bipolar disorder, obesity, T2DM   OT comments  STAR Patient OT/PT session: Patient received in supine with complaints of headache and LUE shoulder pain. Patient asking not to address transfers and agreeable to EOB and address LUE shoulder ROM. Patient instructed in scapular retraction, shoulder flexion, elevation, and rotation. Patient with increased comfort for LUE but continues to be painful. Patient demonstrating gains with sit to supine. Discharge recommendations continue to be appropriate. Acute OT to continue to follow to address established goals to facilitate DC to next venue of care.        If plan is discharge home, recommend the following:  Two people to help with walking and/or transfers;Assistance with cooking/housework;Assistance with feeding;Direct supervision/assist for medications management;Direct supervision/assist for financial management;Assist for transportation;Help with stairs or ramp for entrance;A lot of help with bathing/dressing/bathroom   Equipment Recommendations  Other (comment) (TBD)    Recommendations for Other Services      Precautions / Restrictions Precautions Precautions: Fall Recall of Precautions/Restrictions: Impaired Restrictions Weight Bearing Restrictions Per Provider Order: Yes LUE Weight Bearing Per Provider Order: Non weight bearing Other  Position/Activity Restrictions: ROM to tolerance       Mobility Bed Mobility Overal bed mobility: Needs Assistance Bed Mobility: Supine to Sit, Sit to Supine     Supine to sit: Mod assist, HOB elevated, Used rails Sit to supine: Max assist, +2 for physical assistance   General bed mobility comments: increased time due to pain and stiffness at hips, Patient attempting to assist to go back to supine but required max assist +2 to complete    Transfers                   General transfer comment: not attempted due to patient stating he did not feel well and had a headache     Balance Overall balance assessment: Needs assistance Sitting-balance support: Feet supported, No upper extremity supported Sitting balance-Leahy Scale: Fair Sitting balance - Comments: EOB                                   ADL either performed or assessed with clinical judgement   ADL Overall ADL's : Needs assistance/impaired     Grooming: Wash/dry face;Set up;Sitting Grooming Details (indicate cue type and reason): on EOB                               General ADL Comments: focused on EOB sitting balance and LUE shoulder ROM exercises    Extremity/Trunk Assessment Upper Extremity Assessment LUE Deficits / Details: s/p LUE ambutation at the distal humerus. Pain well controlled, able to raise to 80 deg of shoulder flexion, attempted gentle PROM and pain at roughly 95 deg. Noted to have slight scaupular winging ~15 to 20 deg. does c/o phantom limb pain  Vision       Perception     Praxis     Communication Communication Communication: Impaired Factors Affecting Communication: Reduced clarity of speech   Cognition Arousal: Alert Behavior During Therapy: WFL for tasks assessed/performed Cognition: Cognition impaired     Awareness: Intellectual awareness intact, Online awareness impaired     Executive functioning impairment (select all  impairments): Problem solving                   Following commands: Impaired Following commands impaired: Follows one step commands with increased time      Cueing   Cueing Techniques: Verbal cues, Tactile cues, Visual cues  Exercises Exercises: Other exercises Other Exercises Other Exercises: Arm swings x10 LLE Other Exercises: Arm Circles x15 LUE Other Exercises: scapular retraction x10 with 3-5 second hold    Shoulder Instructions       General Comments VSS on RA    Pertinent Vitals/ Pain       Pain Assessment Pain Assessment: Faces Faces Pain Scale: Hurts even more Pain Location: back, right knee, LUE surgical site headache Pain Descriptors / Indicators: Discomfort, Aching, Guarding, Headache Pain Intervention(s): Limited activity within patient's tolerance, Monitored during session, Premedicated before session, Repositioned  Home Living                                          Prior Functioning/Environment              Frequency  Min 1X/week (STAR Program)        Progress Toward Goals  OT Goals(current goals can now be found in the care plan section)  Progress towards OT goals: Progressing toward goals  Acute Rehab OT Goals Patient Stated Goal: to go home OT Goal Formulation: With patient Time For Goal Achievement: 10/29/23 Potential to Achieve Goals: Fair ADL Goals Pt Will Perform Grooming: sitting;with set-up (A/E PRN) Pt Will Perform Upper Body Dressing: sitting;with contact guard assist;with adaptive equipment Pt Will Perform Lower Body Dressing: sitting/lateral leans;sit to/from stand;with mod assist (using one handed RUE techniques) Pt Will Transfer to Toilet: with contact guard assist;bedside commode;with transfer board Pt/caregiver will Perform Home Exercise Program: Increased ROM;Left upper extremity;With written HEP provided;Independently Additional ADL Goal #1: Pt will complete bed mobility with min A as a  precursor to EOB ADLs Additional ADL Goal #2: Pt will demonstrate ability to stand statically for >3 mins to demonstrate improving tolerance in prep for OOB mobility Additional ADL Goal #3: Pt will demonstrate independence with sensory stimulation to LUE to improve phantom symptoms.  Plan      Co-evaluation    PT/OT/SLP Co-Evaluation/Treatment: Yes Reason for Co-Treatment: For patient/therapist safety;To address functional/ADL transfers PT goals addressed during session: Mobility/safety with mobility;Balance OT goals addressed during session: ADL's and self-care;Strengthening/ROM      AM-PAC OT 6 Clicks Daily Activity     Outcome Measure   Help from another person eating meals?: A Little Help from another person taking care of personal grooming?: A Little Help from another person toileting, which includes using toliet, bedpan, or urinal?: Total Help from another person bathing (including washing, rinsing, drying)?: A Lot Help from another person to put on and taking off regular upper body clothing?: A Lot Help from another person to put on and taking off regular lower body clothing?: A Lot 6 Click Score: 13    End of Session  OT Visit Diagnosis: Unsteadiness on feet (R26.81);Other abnormalities of gait and mobility (R26.89);Muscle weakness (generalized) (M62.81);Pain Pain - Right/Left: Left Pain - part of body: Shoulder   Activity Tolerance Patient limited by pain   Patient Left in bed;with call bell/phone within reach   Nurse Communication Mobility status        Time: 8640-8566 OT Time Calculation (min): 34 min  Charges: OT General Charges $OT Visit: 1 Visit OT Treatments $Therapeutic Exercise: 8-22 mins  [name], OTA Acute Rehabilitation Services  Office [phone]   [name] 10/17/2023, 2:46 PM

## 2023-10-17 NOTE — Progress Notes (Signed)
 Physical Therapy Treatment Patient Details Name: Frank [name]. MRN: [id number] DOB: 1965-03-30 Today's Date: 10/17/2023   History of Present Illness 58 y.o. male transferred from [facility] 07/03/23 to [facility] for management of newly diagnosed plasma cell myeloma with weakness and AMS. Pt also with AKI and metabolic encephalopathy. 5/10 transfer to [facility] for iHD. 4/22 Intubated and chemo initiated. 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 5/6 IVIG started. 5/8-6/11 trach. 5/12 iHD initiated. 5/29 PEG placed. 6/5 chemo initiated. PMHx:Lt THA, carpal tunnel syndrome, Lt TKA, Lt humerus fx, PAF, HTN, HLD, bipolar disorder, obesity, T2DM    PT Comments  STAR PT/OT Session: Pt with headache since the wound vac on his L UE was removed. Agreeable to limited activity. Pt is able to come to EOB with modA. While there pt working on L shoulder and chest ROM as well and chin retraction and neck rotation and cervical flexion. PT provided DTM for improved ROM. Will continue to work towards transfer to wheelchair and sitting tolerance of 3 hours for outpatient chemotherapy.    If plan is discharge home, recommend the following: Two people to help with walking and/or transfers;Assistance with cooking/housework;Assist for transportation;Two people to help with bathing/dressing/bathroom;Direct supervision/assist for medications management;Direct supervision/assist for financial management;Help with stairs or ramp for entrance;Assistance with feeding   Can travel by private vehicle     No  Equipment Recommendations  Hospital bed;Hoyer lift;Wheelchair (measurements PT);Wheelchair cushion (measurements PT);BSC/3in1       Precautions / Restrictions Precautions Precautions: Fall Recall of Precautions/Restrictions: Impaired Restrictions Weight Bearing Restrictions Per Provider Order: Yes LUE Weight Bearing Per Provider Order: Non weight bearing Other Position/Activity Restrictions: ROM to tolerance     Mobility  Bed  Mobility Overal bed mobility: Needs Assistance Bed Mobility: Supine to Sit, Sit to Supine     Supine to sit: Mod assist, HOB elevated, Used rails Sit to supine: Max assist, +2 for physical assistance   General bed mobility comments: increased time due to pain and stiffness at hips, Patient attempting to assist to go back to supine but required max assist +2 to complete    Transfers                   General transfer comment: not attempted due to patient stating he did not feel well and had a headache           Balance Overall balance assessment: Needs assistance Sitting-balance support: Feet supported, No upper extremity supported Sitting balance-Leahy Scale: Fair Sitting balance - Comments: EOB                                    Communication Communication Communication: Impaired Factors Affecting Communication: Reduced clarity of speech  Cognition Arousal: Alert Behavior During Therapy: WFL for tasks assessed/performed                             Following commands: Impaired Following commands impaired: Follows one step commands with increased time    Cueing Cueing Techniques: Verbal cues, Tactile cues, Visual cues  Exercises Other Exercises Other Exercises: Arm swings x10 LLE Other Exercises: Arm Circles x15 LUE Other Exercises: scapular retraction x10 with 3-5 second hold    General Comments General comments (skin integrity, edema, etc.): VSS on RA      Pertinent Vitals/Pain Pain Assessment Pain Assessment: Faces Faces Pain Scale: Hurts even  more Pain Location: back, right knee, LUE surgical site headache Pain Descriptors / Indicators: Discomfort, Aching, Guarding, Headache Pain Intervention(s): Limited activity within patient's tolerance     PT Goals (current goals can now be found in the care plan section) Acute Rehab PT Goals PT Goal Formulation: With patient Time For Goal Achievement: 10/23/23 Potential to Achieve  Goals: Fair Progress towards PT goals: Progressing toward goals    Frequency    Min 5X/week           Co-evaluation PT/OT/SLP Co-Evaluation/Treatment: Yes Reason for Co-Treatment: For patient/therapist safety;To address functional/ADL transfers PT goals addressed during session: Mobility/safety with mobility;Balance OT goals addressed during session: ADL's and self-care;Strengthening/ROM      AM-PAC PT 6 Clicks Mobility   Outcome Measure  Help needed turning from your back to your side while in a flat bed without using bedrails?: A Lot Help needed moving from lying on your back to sitting on the side of a flat bed without using bedrails?: Total Help needed moving to and from a bed to a chair (including a wheelchair)?: Total Help needed standing up from a chair using your arms (e.g., wheelchair or bedside chair)?: Total Help needed to walk in hospital room?: Total Help needed climbing 3-5 steps with a railing? : Total 6 Click Score: 7    End of Session   Activity Tolerance: Patient limited by pain Patient left: in bed;with call bell/phone within reach;with family/visitor present Nurse Communication: Mobility status PT Visit Diagnosis: Other abnormalities of gait and mobility (R26.89);Muscle weakness (generalized) (M62.81);Difficulty in walking, not elsewhere classified (R26.2)     Time: 8640-8566 PT Time Calculation (min) (ACUTE ONLY): 34 min  Charges:    $Therapeutic Activity: 8-22 mins PT General Charges $$ ACUTE PT VISIT: 1 Visit                     Chevon Laufer B. Fleeta Lapidus PT, DPT Acute Rehabilitation Services Please use secure chat or  Call Office [phone]    [name] [facility] 10/17/2023, 3:41 PM

## 2023-10-18 DIAGNOSIS — I5032 Chronic diastolic (congestive) heart failure: Secondary | ICD-10-CM | POA: Diagnosis not present

## 2023-10-18 DIAGNOSIS — C9000 Multiple myeloma not having achieved remission: Secondary | ICD-10-CM | POA: Diagnosis not present

## 2023-10-18 DIAGNOSIS — A419 Sepsis, unspecified organism: Secondary | ICD-10-CM | POA: Diagnosis not present

## 2023-10-18 DIAGNOSIS — R531 Weakness: Secondary | ICD-10-CM | POA: Diagnosis not present

## 2023-10-18 LAB — COMPREHENSIVE METABOLIC PANEL WITH GFR
ALT: 6 U/L (ref 0–44)
AST: 9 U/L — ABNORMAL LOW (ref 15–41)
Albumin: 2.3 g/dL — ABNORMAL LOW (ref 3.5–5.0)
Alkaline Phosphatase: 58 U/L (ref 38–126)
Anion gap: 10 (ref 5–15)
BUN: 38 mg/dL — ABNORMAL HIGH (ref 6–20)
CO2: 22 mmol/L (ref 22–32)
Calcium: 8.3 mg/dL — ABNORMAL LOW (ref 8.9–10.3)
Chloride: 97 mmol/L — ABNORMAL LOW (ref 98–111)
Creatinine, Ser: 1.3 mg/dL — ABNORMAL HIGH (ref 0.61–1.24)
GFR, Estimated: >60 mL/min (ref >60)
Glucose, Bld: 112 mg/dL — ABNORMAL HIGH (ref 70–99)
Potassium: 4.3 mmol/L (ref 3.5–5.1)
Sodium: 129 mmol/L — ABNORMAL LOW (ref 135–145)
Total Bilirubin: 0.4 mg/dL (ref 0.0–1.2)
Total Protein: 5.4 g/dL — ABNORMAL LOW (ref 6.5–8.1)

## 2023-10-18 LAB — CBC
HCT: 28.6 % — ABNORMAL LOW (ref 39.0–52.0)
Hemoglobin: 9.1 g/dL — ABNORMAL LOW (ref 13.0–17.0)
MCH: 31.8 pg (ref 26.0–34.0)
MCHC: 31.8 g/dL (ref 30.0–36.0)
MCV: 100 fL (ref 80.0–100.0)
Platelets: 261 K/uL (ref 150–400)
RBC: 2.86 MIL/uL — ABNORMAL LOW (ref 4.22–5.81)
RDW: 19.1 % — ABNORMAL HIGH (ref 11.5–15.5)
WBC: 6.1 K/uL (ref 4.0–10.5)
nRBC: 0 % (ref 0.0–0.2)

## 2023-10-18 LAB — AEROBIC/ANAEROBIC CULTURE W GRAM STAIN (SURGICAL/DEEP WOUND)
Culture: NO GROWTH
Culture: NO GROWTH
Gram Stain: NONE SEEN
Gram Stain: NONE SEEN

## 2023-10-18 LAB — GLUCOSE, CAPILLARY
Glucose-Capillary: 131 mg/dL — ABNORMAL HIGH (ref 70–99)
Glucose-Capillary: 187 mg/dL — ABNORMAL HIGH (ref 70–99)
Glucose-Capillary: 160 mg/dL — ABNORMAL HIGH (ref 70–99)
Glucose-Capillary: 176 mg/dL — ABNORMAL HIGH (ref 70–99)

## 2023-10-18 NOTE — Progress Notes (Signed)
 [facility]                                                                              Frank [name], is a 58 y.o. male, DOB - 1965-11-13, FMW:[id number] Admit date - 07/03/2023    Outpatient Primary MD for the patient is Frank Clem Pitts, MD  LOS - 107  days  No chief complaint on file.      Brief summary   58 year old PMH OSA CPAP, pathological fracture and multiple ORIF left Humerus likely due to multiple myeloma, A-fib not on anticoagulation, hyperlipidemia, tobacco use disorder who was feeling weak for 1 week, admitted to [facility] with concern of sepsis, community-acquired pneumonia and acute encephalopathy. Patient was found to have lytic lesions, he was transferred to [facility] for oncology treatment. In the meantime he developed encephalopathy, acute renal failure. He remained in the ICU for 36 days. Underwent tracheostomy. He was also started on HD after CRRT,off of HD now. Subsequently PEG tube placed 5/29 and IR drain placement of right thigh hip infected hematoma on 7/11 . S/p PEG tube removal. Needs Port-A-Cath placement (for outpatient chemo), either during this hospitalization or as an outpatient. F/u IR recommendations for drain management. Started on heparin  drip on 7/17 because of b/l LE DVT.  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 on 7/28.  Surgical EBL 750 cc. Oncology and IR following.   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 - continue pain management, currently controlled. - Wound VAC LUE removed on 8/1 - Per Ortho, dry dressing changes, continue eliquis    Infected right flank/gluteal hematoma -Underwent IR drain placement for infected Hematoma on 7/11.  -Drain exchanged on 7/25. -Completed antibiotic course on 7/26, cultures negative  - Continue  drain, IR following, planning repeat sclerotherapy on 8/7   Sepsis in the setting of Staph Epi Bacteremia  -blood culture with MRSE in 1 out of 4 bottles.  - Completed a course of vancomycin , 6/20, received a line holiday.   - Repeat blood culture 6/9 without growth.  Completed zyvox  on 7/26   Multiple myeloma:  - Started chemo with Velcade /Cytoxan  on 08/21/2023.  Chemo changed to Sarclisa /Kyprolis /Cytoxan  later on.  Currently on hold. -Myeloma responded well to treatment, IgG decreased from [phone] (normal range). -Oncology, Dr Frank [name] following and planning to resume treatment next week - Port-A-Cath placed on 7/30 by IR   AKI on CKD-3A/uremia:  - due to MM, hypercalcemia and ACE inhibitors?  Briefly required hemodialysis.   - AKI resolved and TDC removed.  Cr started to trend up again likely for multiple myeloma. - Creatinine improving 1.3, close to his baseline   BLE DVT:  - US  venous doppler on 7/16 positive for DVT in his RLE PTV, left CFV, SFJ, and PFV. - Continue Eliquis    Left sided pleuritic chest wall pain:   - No rib fractures seen on CXR - Pain control   Aspergillosis with pneumonia  -Continue  voriconazole .  End date on 8/5.   Acute Hypoxic Respiratory Failure -trach removed 6/11.  Decannulated.   Severe acute metabolic encephalopathy with hypoactive delirium, seizure disorder - Normal MRI of the brain.  LP negative for meningitis or encephalitis.  EEG negative for seizures but encephalopathy.  -Resolved   Acute postoperative blood loss anemia superimposed on anemia of renal disease:  - H/H stable    IDDM-2 with hyperglycemia - Hb A1c 6.0%. -Continue with sliding scale insulin .   CBG (last 3)  Recent Labs    10/17/23 1610 10/17/23 2059 10/18/23 0750  GLUCAP 164* 159* 131*   Continue glargine 13 units daily Continue moderate SSI, NovoLog  2 units 3 times daily AC    Paroxysmal A-fib:: Rate controlled. -Continue verapamil  - Continue Eliquis  for  anticoagulation.   Essential hypertension: Normotensive. - Continue verapamil    Obstructive Sleep Apnea  - Trach removed. CPAP    Anxiety/Bipolar disorder  -  Continue Paxil .     Hypokalemia/hyperkalemia: K5.8. - Potassium stable   Hyponatremia:  -follow serum osmolarity, urine osmolarity, UNA for further workup -Continue to monitor   Dysphagia:  - Resolved.  PEG tube removed.  On regular diet.    RN Pressure Injury Documentation: Pressure Injury 07/05/23 Heel Left;Right Unstageable - Full thickness tissue loss in which the base of the injury is covered by slough (yellow, tan, gray, green or brown) and/or eschar (tan, brown or black) in the wound bed. (Active)  07/05/23 1108  Location: Heel  Location Orientation: Left;Right  Staging: Unstageable - Full thickness tissue loss in which the base of the injury is covered by slough (yellow, tan, gray, green or brown) and/or eschar (tan, brown or black) in the wound bed.  Wound Description (Comments):   DO NOT USE:  Present on Admission: Yes  Dressing Type None 10/12/23 1935     Pressure Injury 07/11/23 Sacrum Mid Unstageable - Full thickness tissue loss in which the base of the injury is covered by slough (yellow, tan, gray, green or brown) and/or eschar (tan, brown or black) in the wound bed. (Active)  07/11/23 1600  Location: Sacrum  Location Orientation: Mid  Staging: Unstageable - Full thickness tissue loss in which the base of the injury is covered by slough (yellow, tan, gray, green or brown) and/or eschar (tan, brown or black) in the wound bed.  Wound Description (Comments):   DO NOT USE:  Present on Admission: No  Dressing Type Gauze (Comment);Santyl ;Barrier Film (skin prep);Foam - Lift dressing to assess site every shift 10/07/23 1400     Pressure Injury 07/27/23 Buttocks Right;Lower (Active)  07/27/23 1700  Location: Buttocks  Location Orientation: Right;Lower  Staging:   Wound Description (Comments):   DO NOT USE:   Present on Admission: Yes  Dressing Type None 10/13/23 0515     Pressure Injury 08/25/23 Buttocks Left Stage 2 -  Partial thickness loss of dermis presenting as a shallow open injury with a red, pink wound bed without slough. (Active)  08/25/23   Location: Buttocks  Location Orientation: Left  Staging: Stage 2 -  Partial thickness loss of dermis presenting as a shallow open injury with a red, pink wound bed without slough.  Wound Description (Comments):   DO NOT USE:  Present on Admission: No  Dressing Type None 10/13/23 0515    Severe protein calorie malnutrition  Nutrition Problem: Severe Malnutrition Etiology: acute illness (prolonged illness, interuptions in feeding) Signs/Symptoms: moderate fat depletion, moderate muscle depletion, percent weight loss (9.6% x 1 month)  Percent weight loss: 9.6 % Interventions: Ensure Enlive (each supplement provides 350kcal and 20 grams of protein), MVI, Liberalize Diet, Prostat, Juven  Obesity class I Estimated body mass index is 30.5 kg/m as calculated from the following:   Height as of this encounter: 6' (1.829 m).   Weight as of this encounter: 102 kg.  Code Status: full  DVT Prophylaxis:  SCDs Start: 10/13/23 1123 Place and maintain sequential compression device Start: 07/14/23 1033 apixaban  (ELIQUIS ) tablet 5 mg   Level of Care: Level of care: Telemetry Medical Family Communication:  Disposition Plan:      Remains inpatient appropriate:      Procedures:    Consultants:     Antimicrobials:   Anti-infectives (From admission, onward)    Start     Dose/Rate Route Frequency Ordered Stop   10/23/23 0800  doxycycline  200mg  in 20 mL SWI irrigation / sclerosing solution for IR       Note to Pharmacy: TO IR ON 8/7   200 mg Irrigation To Radiology 10/16/23 1257 10/24/23 0800   10/23/23 0800  doxycycline  200mg  in 20 mL SWI irrigation / sclerosing solution for IR       Note to Pharmacy: TO IR ON 8/7 FOR PROCEDURE   200 mg Irrigation  To Radiology 10/16/23 1258 10/24/23 0800   10/18/23 1000  voriconazole  (VFEND ) tablet 450 mg        450 mg Oral Daily 10/17/23 1457 10/22/23 0959   10/17/23 2200  voriconazole  (VFEND ) tablet 400 mg        400 mg Oral Daily at bedtime 10/17/23 1457 10/22/23 2159   10/13/23 1400  ceFAZolin  (ANCEF ) IVPB 2g/100 mL premix        2 g 200 mL/hr over 30 Minutes Intravenous Every 8 hours 10/13/23 1254 10/14/23 0618   10/13/23 0937  vancomycin  (VANCOCIN ) powder  Status:  Discontinued          As needed 10/13/23 0937 10/13/23 1002   10/10/23 0900  doxycycline  200mg  in 20 mL SWI irrigation / sclerosing solution for IR       Note to Pharmacy: For irrigation only. WARNING - SCLEROSING AGENT  #5 vials sent to IR for procedure   500 mg Irrigation To Radiology 10/07/23 1418 10/10/23 1015   10/10/23 0000  doxycycline  (VIBRAMYCIN ) 500 mg in dextrose  5 % 250 mL IVPB  Status:  Discontinued        500 mg 125 mL/hr over 120 Minutes Intravenous To Radiology 10/07/23 1402 10/07/23 1409   10/10/23 0000  doxycycline  200mg  in 20 mL SWI irrigation / sclerosing solution for IR  Status:  Discontinued        200 mg Irrigation To Radiology 10/07/23 1418 10/07/23 1456   10/10/23 0000  doxycycline  200mg  in 20 mL SWI irrigation / sclerosing solution for IR  Status:  Discontinued        200 mg Irrigation To Radiology 10/07/23 1440 10/07/23 1456   10/02/23 2200  linezolid  (ZYVOX ) tablet 600 mg        600 mg Oral Every 12 hours 10/01/23 0852 10/09/23 2108   10/02/23 1000  linezolid  (ZYVOX ) tablet 600 mg  Status:  Discontinued        600 mg Oral Every 12 hours 10/01/23 0723 10/01/23 0852   09/30/23 1300  vancomycin  variable dose per unstable renal function (pharmacist dosing)  Status:  Discontinued         Does not apply See admin instructions 09/30/23 1301 10/01/23 0725  09/29/23 1145  amoxicillin -clavulanate (AUGMENTIN ) 875-125 MG per tablet 1 tablet        1 tablet Oral Every 12 hours 09/29/23 1047 10/09/23 2108    09/27/23 0200  vancomycin  (VANCOREADY) IVPB 1500 mg/300 mL        1,500 mg 150 mL/hr over 120 Minutes Intravenous Every 12 hours 09/26/23 1402 09/30/23 1448   09/26/23 1500  cefTRIAXone  (ROCEPHIN ) 2 g in sodium chloride  0.9 % 100 mL IVPB  Status:  Discontinued        2 g 200 mL/hr over 30 Minutes Intravenous Every 24 hours 09/26/23 1402 09/29/23 1047   09/26/23 1315  vancomycin  (VANCOREADY) IVPB 2000 mg/400 mL        2,000 mg 200 mL/hr over 120 Minutes Intravenous  Once 09/26/23 1220 09/26/23 1656   09/26/23 1230  metroNIDAZOLE  (FLAGYL ) IVPB 500 mg  Status:  Discontinued        500 mg 100 mL/hr over 60 Minutes Intravenous Every 12 hours 09/26/23 1137 09/29/23 1138   09/22/23 2200  voriconazole  (VFEND ) tablet 400 mg  Status:  Discontinued        400 mg Oral Every 12 hours 09/22/23 1521 10/17/23 1457   09/11/23 2200  acyclovir  (ZOVIRAX ) 200 MG capsule 400 mg       Note to Pharmacy: Give after dialysis the pm dose, on dialysis days   400 mg Oral 2 times daily 09/11/23 1551     09/11/23 2200  voriconazole  (VFEND ) tablet 350 mg  Status:  Discontinued        350 mg Oral Every 12 hours 09/11/23 1551 09/22/23 1521   09/07/23 1000  acyclovir  (ZOVIRAX ) 200 MG capsule 400 mg  Status:  Discontinued       Note to Pharmacy: Give after dialysis the pm dose, on dialysis days   400 mg Per Tube 2 times daily 09/07/23 0825 09/11/23 1551   09/03/23 1200  vancomycin  (VANCOCIN ) IVPB 1000 mg/200 mL premix  Status:  Discontinued        1,000 mg 200 mL/hr over 60 Minutes Intravenous Every M-W-F (Hemodialysis) 09/02/23 1021 09/03/23 0942   09/03/23 0942  vancomycin  variable dose per unstable renal function (pharmacist dosing)  Status:  Discontinued         Does not apply See admin instructions 09/03/23 0942 09/03/23 0945   09/02/23 0915  vancomycin  (VANCOCIN ) IVPB 1000 mg/200 mL premix        1,000 mg 200 mL/hr over 60 Minutes Intravenous  Once 09/02/23 0830 09/02/23 1215   09/01/23 0834  ceFAZolin  (ANCEF ) IVPB  2g/100 mL premix        over 30 Minutes Intravenous Continuous PRN 09/01/23 0834 09/01/23 0834   08/29/23 0914  vancomycin  variable dose per unstable renal function (pharmacist dosing)  Status:  Discontinued         Does not apply See admin instructions 08/29/23 0914 09/02/23 1021   08/27/23 1400  vancomycin  (VANCOCIN ) IVPB 1000 mg/200 mL premix        1,000 mg 200 mL/hr over 60 Minutes Intravenous  Once 08/27/23 0920 08/27/23 1653   08/25/23 1200  vancomycin  (VANCOCIN ) IVPB 1000 mg/200 mL premix        1,000 mg 200 mL/hr over 60 Minutes Intravenous Every M-W-F (Hemodialysis) 08/24/23 1405 08/29/23 1240   08/24/23 1445  vancomycin  (VANCOREADY) IVPB 2000 mg/400 mL        2,000 mg 200 mL/hr over 120 Minutes Intravenous  Once 08/24/23 1353 08/25/23 0323   08/14/23 0600  ceFAZolin  (ANCEF ) IVPB 2g/100 mL premix        2 g 200 mL/hr over 30 Minutes Intravenous To Radiology 08/13/23 1055 08/14/23 0606   08/09/23 1000  acyclovir  (ZOVIRAX ) 200 MG capsule 200 mg  Status:  Discontinued       Note to Pharmacy: Give after dialysis the pm dose, on dialysis days   200 mg Per Tube 2 times daily 08/08/23 2358 09/07/23 0825   08/09/23 0100  acyclovir  (ZOVIRAX ) 200 MG capsule 200 mg       Note to Pharmacy: Give after dialysis the pm dose, on dialysis days   200 mg Per Tube  Once 08/09/23 0009 08/09/23 0013   08/08/23 2345  acyclovir  (ZOVIRAX ) 200 MG capsule 200 mg  Status:  Discontinued       Note to Pharmacy: Give after dialysis the pm dose, on dialysis days   200 mg Oral 2 times daily 08/08/23 2257 08/08/23 2358   08/07/23 1530  ceFAZolin  (ANCEF ) IVPB 2g/100 mL premix        over 30 Minutes Intravenous Continuous PRN 08/07/23 1555 08/07/23 1530   08/05/23 2200  voriconazole  (VFEND ) tablet 350 mg  Status:  Discontinued        350 mg Per Tube Every 12 hours 08/05/23 1511 09/11/23 1551   08/01/23 1200  vancomycin  (VANCOCIN ) IVPB 1000 mg/200 mL premix        1,000 mg 200 mL/hr over 60 Minutes Intravenous  Every M-W-F (Hemodialysis) 07/31/23 0649 08/01/23 1652   07/30/23 1200  vancomycin  (VANCOCIN ) IVPB 1000 mg/200 mL premix        1,000 mg 200 mL/hr over 60 Minutes Intravenous Every M-W-F (Hemodialysis) 07/30/23 0851 07/30/23 1241   07/29/23 0900  vancomycin  (VANCOCIN ) IVPB 1000 mg/200 mL premix        1,000 mg 200 mL/hr over 60 Minutes Intravenous  Once 07/29/23 0812 07/29/23 0943   07/28/23 1204  vancomycin  variable dose per unstable renal function (pharmacist dosing)  Status:  Discontinued         Does not apply See admin instructions 07/28/23 1204 07/31/23 0649   07/28/23 1000  voriconazole  (VFEND ) 350 mg in sodium chloride  0.9 % 100 mL IVPB  Status:  Discontinued       Placed in Followed by Linked Group   4 mg/kg  88.6 kg (Adjusted) 67.5 mL/hr over 120 Minutes Intravenous Every 12 hours 07/28/23 0748 08/05/23 1511   07/27/23 1000  voriconazole  (VFEND ) 400 mg in sodium chloride  0.9 % 100 mL IVPB  Status:  Discontinued       Placed in Followed by Linked Group   4 mg/kg  100.6 kg 70 mL/hr over 120 Minutes Intravenous Every 12 hours 07/26/23 0826 07/28/23 0748   07/27/23 0945  vancomycin  (VANCOREADY) IVPB 2000 mg/400 mL        2,000 mg 200 mL/hr over 120 Minutes Intravenous  Once 07/27/23 0849 07/27/23 1237   07/26/23 1515  Ampicillin -Sulbactam (UNASYN ) 3 g in sodium chloride  0.9 % 100 mL IVPB  Status:  Discontinued        3 g 200 mL/hr over 30 Minutes Intravenous Every 8 hours 07/26/23 1418 07/27/23 0830   07/26/23 0915  voriconazole  (VFEND ) 600 mg in sodium chloride  0.9 % 150 mL IVPB       Placed in Followed by Linked Group   6 mg/kg  100.6 kg 105 mL/hr over 120 Minutes Intravenous Every 12 hours 07/26/23 0826 07/27/23 0048   07/26/23 0900  Ampicillin -Sulbactam (UNASYN ) 3 g  in sodium chloride  0.9 % 100 mL IVPB  Status:  Discontinued        3 g 200 mL/hr over 30 Minutes Intravenous Every 12 hours 07/26/23 0826 07/26/23 1418   07/23/23 1000  levofloxacin  (LEVAQUIN ) IVPB 500 mg   Status:  Discontinued       Placed in Followed by Linked Group   500 mg 100 mL/hr over 60 Minutes Intravenous Every 24 hours 07/22/23 1213 07/23/23 1001   07/23/23 1000  levofloxacin  (LEVAQUIN ) IVPB 500 mg  Status:  Discontinued       Placed in Followed by Linked Group   500 mg 100 mL/hr over 60 Minutes Intravenous Every 24 hours 07/23/23 1001 07/28/23 0804   07/22/23 1300  levofloxacin  (LEVAQUIN ) IVPB 750 mg       Placed in Followed by Linked Group   750 mg 100 mL/hr over 90 Minutes Intravenous  Once 07/22/23 1213 07/22/23 1542   07/21/23 1600  voriconazole  (VFEND ) 300 mg in sodium chloride  0.9 % 100 mL IVPB  Status:  Discontinued        300 mg 65 mL/hr over 120 Minutes Intravenous Every 12 hours 07/21/23 1430 07/22/23 1415   07/21/23 1000  voriconazole  (VFEND ) tablet 200 mg  Status:  Discontinued        200 mg Oral Every 12 hours 07/20/23 0944 07/21/23 1015   07/20/23 1030  voriconazole  (VFEND ) 530 mg in sodium chloride  0.9 % 150 mL IVPB        6 mg/kg  88.6 kg (Adjusted) 101.5 mL/hr over 120 Minutes Intravenous Every 12 hours 07/20/23 0944 07/21/23 0030   07/19/23 1400  meropenem  (MERREM ) 1 g in sodium chloride  0.9 % 100 mL IVPB  Status:  Discontinued        1 g 200 mL/hr over 30 Minutes Intravenous Every 8 hours 07/19/23 1143 07/22/23 1213   07/18/23 0600  meropenem  (MERREM ) 1 g in sodium chloride  0.9 % 100 mL IVPB  Status:  Discontinued        1 g 200 mL/hr over 30 Minutes Intravenous Every 24 hours 07/17/23 0916 07/19/23 1143   07/17/23 0700  meropenem  (MERREM ) 1 g in sodium chloride  0.9 % 100 mL IVPB  Status:  Discontinued        1 g 200 mL/hr over 30 Minutes Intravenous Every 12 hours 07/17/23 0601 07/17/23 0916   07/15/23 1300  vancomycin  (VANCOCIN ) IVPB 1000 mg/200 mL premix  Status:  Discontinued        1,000 mg 200 mL/hr over 60 Minutes Intravenous Every 24 hours 07/14/23 1129 07/22/23 1319   07/14/23 1215  vancomycin  (VANCOREADY) IVPB 1750 mg/350 mL        1,750  mg 175 mL/hr over 120 Minutes Intravenous  Once 07/14/23 1129 07/14/23 1449   07/09/23 1800  piperacillin -tazobactam (ZOSYN ) IVPB 3.375 g  Status:  Discontinued        3.375 g 12.5 mL/hr over 240 Minutes Intravenous Every 12 hours 07/09/23 0756 07/09/23 0844   07/09/23 1400  piperacillin -tazobactam (ZOSYN ) IVPB 3.375 g  Status:  Discontinued        3.375 g 12.5 mL/hr over 240 Minutes Intravenous Every 8 hours 07/09/23 0844 07/17/23 0557   07/08/23 1300  piperacillin -tazobactam (ZOSYN ) IVPB 3.375 g  Status:  Discontinued        3.375 g 12.5 mL/hr over 240 Minutes Intravenous Every 8 hours 07/08/23 1129 07/09/23 0756   07/05/23 2200  azithromycin  (ZITHROMAX ) 500 mg in sodium chloride  0.9 % 250 mL  IVPB        500 mg 250 mL/hr over 60 Minutes Intravenous Daily at bedtime 07/05/23 2022 07/06/23 2302   07/05/23 2115  azithromycin  (ZITHROMAX ) 250 mg in dextrose  5 % 125 mL IVPB  Status:  Discontinued       Note to Pharmacy: Missed dose this morning   250 mg 127.5 mL/hr over 60 Minutes Intravenous Every 24 hours 07/05/23 2016 07/05/23 2021   07/04/23 0900  fluconazole  (DIFLUCAN ) IVPB 200 mg  Status:  Discontinued        200 mg 100 mL/hr over 60 Minutes Intravenous Every 48 hours 07/04/23 0722 07/09/23 0809   07/03/23 0600  cefTRIAXone  (ROCEPHIN ) 1 g in sodium chloride  0.9 % 100 mL IVPB  Status:  Discontinued        1 g 200 mL/hr over 30 Minutes Intravenous Every 24 hours 07/03/23 0508 07/08/23 1104   07/03/23 0600  azithromycin  (ZITHROMAX ) tablet 250 mg  Status:  Discontinued        250 mg Oral Daily 07/03/23 0508 07/05/23 2016          Medications  (feeding supplement) PROSource Plus  30 mL Oral TID BM   acyclovir   400 mg Oral BID   apixaban   5 mg Oral BID   collagenase    Topical Daily   docusate sodium   100 mg Oral BID   [START ON 10/23/2023] doxycycline  200mg  in 20 mL SWI irrigation / sclerosing solution for IR  200 mg Irrigation to XRAY   [START ON 10/23/2023] doxycycline  200mg  in 20  mL SWI irrigation / sclerosing solution for IR  200 mg Irrigation to XRAY   epoetin  alfa  40,000 Units Subcutaneous Q Wed-1800   feeding supplement  237 mL Oral BID WC   guaiFENesin   600 mg Oral BID   heparin  lock flush  500 Units Intravenous Once   hydrALAZINE   25 mg Oral Q8H   insulin  aspart  0-15 Units Subcutaneous TID AC & HS   insulin  aspart  2 Units Subcutaneous TID AC & HS   insulin  glargine-yfgn  13 Units Subcutaneous Q24H   lidocaine   1 patch Transdermal Q24H   montelukast   5 mg Oral QHS   multivitamin with minerals  1 tablet Oral Daily   nutrition supplement (JUVEN)  1 packet Oral BID AC   mouth rinse  15 mL Mouth Rinse 4 times per day   pantoprazole   40 mg Oral BID   PARoxetine   40 mg Oral Daily   polyethylene glycol  17 g Oral BID   senna  1 tablet Oral Daily   umeclidinium-vilanterol  1 puff Inhalation Daily   valproic  acid  750 mg Oral BID   verapamil   120 mg Oral Daily   voriconazole   400 mg Oral QHS   voriconazole   450 mg Oral Daily      Subjective:  , Frank [name] was seen and examined today.  No acute complaints. Pain controlled. patient denies dizziness, chest pain, shortness of breath, abdominal pain, N/V.   Objective:   Vitals:   10/17/23 1612 10/17/23 1933 10/18/23 0548 10/18/23 0752  BP: 114/83 112/83 126/89 (!) 119/95  Pulse: 94 96 98 100  Resp: 18 16 16 17   Temp: 98 F (36.7 C) 98 F (36.7 C) 98.2 F (36.8 C) 98.1 F (36.7 C)  TempSrc: Oral Oral Oral   SpO2: 97% 96% 99% 95%  Weight:   102 kg   Height:        Intake/Output Summary (Last  24 hours) at 10/18/2023 1000 Last data filed at 10/18/2023 0549 Gross per 24 hour  Intake --  Output 4370 ml  Net -4370 ml     Wt Readings from Last 3 Encounters:  10/18/23 102 kg  06/05/23 113.7 kg  01/29/23 117.9 kg   Physical Exam General: Alert and oriented x 3, NAD Cardiovascular: S1 S2 clear, RRR.  Respiratory: CTAB, no wheezing Gastrointestinal: Soft, nontender, nondistended, NBS Ext: no  pedal edema bilaterally, LUE above elbow amputation Neuro: no new deficits Psych: Normal affect       Data Reviewed:  I have personally reviewed following labs    CBC Lab Results  Component Value Date   WBC 6.1 10/18/2023   RBC 2.86 (L) 10/18/2023   HGB 9.1 (L) 10/18/2023   HCT 28.6 (L) 10/18/2023   MCV 100.0 10/18/2023   MCH 31.8 10/18/2023   PLT 261 10/18/2023   MCHC 31.8 10/18/2023   RDW 19.1 (H) 10/18/2023   LYMPHSABS 1.2 10/06/2023   MONOABS 0.9 10/06/2023   EOSABS 0.1 10/06/2023   BASOSABS 0.0 10/06/2023     Last metabolic panel Lab Results  Component Value Date   NA 129 (L) 10/18/2023   K 4.3 10/18/2023   CL 97 (L) 10/18/2023   CO2 22 10/18/2023   BUN 38 (H) 10/18/2023   CREATININE 1.30 (H) 10/18/2023   GLUCOSE 112 (H) 10/18/2023   GFRNONAA >60 10/18/2023   GFRAA >60 12/27/2017   CALCIUM  8.3 (L) 10/18/2023   PHOS 4.3 10/17/2023   PROT 5.4 (L) 10/18/2023   ALBUMIN  2.3 (L) 10/18/2023   LABGLOB 2.6 09/12/2023   AGRATIO 0.9 09/12/2023   BILITOT 0.4 10/18/2023   ALKPHOS 58 10/18/2023   AST 9 (L) 10/18/2023   ALT 6 10/18/2023   ANIONGAP 10 10/18/2023    CBG (last 3)  Recent Labs    10/17/23 1610 10/17/23 2059 10/18/23 0750  GLUCAP 164* 159* 131*      Coagulation Profile: No results for input(s): INR, PROTIME in the last 168 hours.   Radiology Studies: I have personally reviewed the imaging studies  No results found.      Nydia Distance M.D. [facility] 10/18/2023, 10:00 AM  Available via Epic secure chat 7am-7pm After 7 pm, please refer to night coverage provider listed on amion.

## 2023-10-18 NOTE — Plan of Care (Signed)
 Problem: Education: Goal: Knowledge of General Education information will improve Description: Including pain rating scale, medication(s)/side effects and non-pharmacologic comfort measures Outcome: Progressing   Problem: Health Behavior/Discharge Planning: Goal: Ability to manage health-related needs will improve Outcome: Progressing   Problem: Clinical Measurements: Goal: Ability to maintain clinical measurements within normal limits will improve Outcome: Progressing Goal: Will remain free from infection Outcome: Progressing Goal: Diagnostic test results will improve Outcome: Progressing Goal: Respiratory complications will improve Outcome: Progressing Goal: Cardiovascular complication will be avoided Outcome: Progressing   Problem: Activity: Goal: Risk for activity intolerance will decrease Outcome: Progressing   Problem: Nutrition: Goal: Adequate nutrition will be maintained Outcome: Progressing   Problem: Coping: Goal: Level of anxiety will decrease Outcome: Progressing   Problem: Elimination: Goal: Will not experience complications related to bowel motility Outcome: Progressing Goal: Will not experience complications related to urinary retention Outcome: Progressing   Problem: Pain Managment: Goal: General experience of comfort will improve and/or be controlled Outcome: Progressing   Problem: Safety: Goal: Ability to remain free from injury will improve Outcome: Progressing   Problem: Skin Integrity: Goal: Risk for impaired skin integrity will decrease Outcome: Progressing   Problem: Education: Goal: Ability to describe self-care measures that may prevent or decrease complications (Diabetes Survival Skills Education) will improve Outcome: Progressing Goal: Individualized Educational Video(s) Outcome: Progressing   Problem: Coping: Goal: Ability to adjust to condition or change in health will improve Outcome: Progressing   Problem: Fluid  Volume: Goal: Ability to maintain a balanced intake and output will improve Outcome: Progressing   Problem: Health Behavior/Discharge Planning: Goal: Ability to identify and utilize available resources and services will improve Outcome: Progressing Goal: Ability to manage health-related needs will improve Outcome: Progressing   Problem: Metabolic: Goal: Ability to maintain appropriate glucose levels will improve Outcome: Progressing   Problem: Nutritional: Goal: Maintenance of adequate nutrition will improve Outcome: Progressing Goal: Progress toward achieving an optimal weight will improve Outcome: Progressing   Problem: Skin Integrity: Goal: Risk for impaired skin integrity will decrease Outcome: Progressing   Problem: Tissue Perfusion: Goal: Adequacy of tissue perfusion will improve Outcome: Progressing   Problem: Education: Goal: Knowledge about tracheostomy care/management will improve Outcome: Progressing   Problem: Activity: Goal: Ability to tolerate increased activity will improve Outcome: Progressing   Problem: Health Behavior/Discharge Planning: Goal: Ability to manage tracheostomy will improve Outcome: Progressing   Problem: Education: Goal: Knowledge of General Education information will improve Description: Including pain rating scale, medication(s)/side effects and non-pharmacologic comfort measures Outcome: Progressing   Problem: Health Behavior/Discharge Planning: Goal: Ability to manage health-related needs will improve Outcome: Progressing   Problem: Clinical Measurements: Goal: Ability to maintain clinical measurements within normal limits will improve Outcome: Progressing Goal: Will remain free from infection Outcome: Progressing Goal: Diagnostic test results will improve Outcome: Progressing Goal: Respiratory complications will improve Outcome: Progressing Goal: Cardiovascular complication will be avoided Outcome: Progressing    Problem: Activity: Goal: Risk for activity intolerance will decrease Outcome: Progressing   Problem: Nutrition: Goal: Adequate nutrition will be maintained Outcome: Progressing   Problem: Coping: Goal: Level of anxiety will decrease Outcome: Progressing   Problem: Elimination: Goal: Will not experience complications related to bowel motility Outcome: Progressing Goal: Will not experience complications related to urinary retention Outcome: Progressing   Problem: Pain Managment: Goal: General experience of comfort will improve and/or be controlled Outcome: Progressing   Problem: Safety: Goal: Ability to remain free from injury will improve Outcome: Progressing   Problem: Skin Integrity: Goal: Risk for impaired  skin integrity will decrease Outcome: Progressing

## 2023-10-19 DIAGNOSIS — R531 Weakness: Secondary | ICD-10-CM | POA: Diagnosis not present

## 2023-10-19 DIAGNOSIS — I5032 Chronic diastolic (congestive) heart failure: Secondary | ICD-10-CM | POA: Diagnosis not present

## 2023-10-19 DIAGNOSIS — A419 Sepsis, unspecified organism: Secondary | ICD-10-CM | POA: Diagnosis not present

## 2023-10-19 DIAGNOSIS — C9000 Multiple myeloma not having achieved remission: Secondary | ICD-10-CM | POA: Diagnosis not present

## 2023-10-19 LAB — COMPREHENSIVE METABOLIC PANEL WITH GFR
ALT: 6 U/L (ref 0–44)
AST: 9 U/L — ABNORMAL LOW (ref 15–41)
Albumin: 2.3 g/dL — ABNORMAL LOW (ref 3.5–5.0)
Alkaline Phosphatase: 62 U/L (ref 38–126)
Anion gap: 9 (ref 5–15)
BUN: 38 mg/dL — ABNORMAL HIGH (ref 6–20)
CO2: 24 mmol/L (ref 22–32)
Calcium: 8.5 mg/dL — ABNORMAL LOW (ref 8.9–10.3)
Chloride: 97 mmol/L — ABNORMAL LOW (ref 98–111)
Creatinine, Ser: 1.22 mg/dL (ref 0.61–1.24)
GFR, Estimated: >60 mL/min (ref >60)
Glucose, Bld: 122 mg/dL — ABNORMAL HIGH (ref 70–99)
Potassium: 4.4 mmol/L (ref 3.5–5.1)
Sodium: 130 mmol/L — ABNORMAL LOW (ref 135–145)
Total Bilirubin: 0.4 mg/dL (ref 0.0–1.2)
Total Protein: 5.5 g/dL — ABNORMAL LOW (ref 6.5–8.1)

## 2023-10-19 LAB — CBC
HCT: 30 % — ABNORMAL LOW (ref 39.0–52.0)
Hemoglobin: 9.4 g/dL — ABNORMAL LOW (ref 13.0–17.0)
MCH: 31.5 pg (ref 26.0–34.0)
MCHC: 31.3 g/dL (ref 30.0–36.0)
MCV: 100.7 fL — ABNORMAL HIGH (ref 80.0–100.0)
Platelets: 338 K/uL (ref 150–400)
RBC: 2.98 MIL/uL — ABNORMAL LOW (ref 4.22–5.81)
RDW: 19 % — ABNORMAL HIGH (ref 11.5–15.5)
WBC: 6.1 K/uL (ref 4.0–10.5)
nRBC: 0 % (ref 0.0–0.2)

## 2023-10-19 LAB — GLUCOSE, CAPILLARY
Glucose-Capillary: 162 mg/dL — ABNORMAL HIGH (ref 70–99)
Glucose-Capillary: 110 mg/dL — ABNORMAL HIGH (ref 70–99)
Glucose-Capillary: 150 mg/dL — ABNORMAL HIGH (ref 70–99)
Glucose-Capillary: 116 mg/dL — ABNORMAL HIGH (ref 70–99)

## 2023-10-19 NOTE — Progress Notes (Signed)
 [facility]                                                                              Frank [name], is a 58 y.o. male, DOB - Jan 23, 1966, FMW:[id number] Admit date - 07/03/2023    Outpatient Primary MD for the patient is Frank Clem Pitts, MD  LOS - 108  days  No chief complaint on file.      Brief summary   58 year old PMH OSA CPAP, pathological fracture and multiple ORIF left Humerus likely due to multiple myeloma, A-fib not on anticoagulation, hyperlipidemia, tobacco use disorder who was feeling weak for 1 week, admitted to [facility] health with concern of sepsis, community-acquired pneumonia and acute encephalopathy. Patient was found to have lytic lesions, he was transferred to [facility] for oncology treatment. In the meantime he developed encephalopathy, acute renal failure. He remained in the ICU for 36 days. Underwent tracheostomy. He was also started on HD after CRRT,off of HD now. Subsequently PEG tube placed 5/29 and IR drain placement of right thigh hip infected hematoma on 7/11 . S/p PEG tube removal. Needs Port-A-Cath placement (for outpatient chemo), either during this hospitalization or as an outpatient. F/u IR recommendations for drain management. Started on heparin  drip on 7/17 because of b/l LE DVT.  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 on 7/28.  Surgical EBL 750 cc. Oncology and IR following.   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 - continue pain management, currently controlled. - Wound VAC LUE removed on 8/1 - Per Ortho, dry dressing changes, continue eliquis    Infected right flank/gluteal hematoma -Underwent IR drain placement for infected Hematoma on 7/11.  -Drain exchanged on 7/25. -Completed antibiotic course on 7/26, cultures negative  - Continue  drain, IR following, planning repeat sclerotherapy on 8/7   Sepsis in the setting of Staph Epi Bacteremia  -blood culture with MRSE in 1 out of 4 bottles.  - Completed a course of vancomycin , 6/20, received a line holiday.   - Repeat blood culture 6/9 without growth.  Completed zyvox  on 7/26   Multiple myeloma:  - Started chemo with Velcade /Cytoxan  on 08/21/2023.  Chemo changed to Sarclisa /Kyprolis /Cytoxan  later on.  Currently on hold. -Myeloma responded well to treatment, IgG decreased from [phone] (normal range). -Oncology, Dr [name] following and planning to resume treatment next week - Port-A-Cath placed on 7/30 by IR - Awaiting oncology, Dr.  Jessy recommendations regarding when chemo will be resumed, can it be done outpatient if patient can be discharged to SNF?   AKI on CKD-3A/uremia:  - due to MM, hypercalcemia and ACE inhibitors?  Briefly required hemodialysis.   - AKI resolved and TDC removed.  Cr started to trend up again likely for multiple myeloma. - Creatinine improving   BLE DVT:  - US  venous doppler on 7/16 positive for DVT in his RLE PTV, left CFV, SFJ, and PFV. - Continue Eliquis    Left sided pleuritic chest wall  pain:   - No rib fractures seen on CXR - Pain control   Aspergillosis with pneumonia  -Continue voriconazole .  End date on 8/5.   Acute Hypoxic Respiratory Failure -trach removed 6/11.  Decannulated.   Severe acute metabolic encephalopathy with hypoactive delirium, seizure disorder - Normal MRI of the brain.  LP negative for meningitis or encephalitis.  EEG negative for seizures but encephalopathy.  -Resolved   Acute postoperative blood loss anemia superimposed on anemia of renal disease:  - H/H stable    IDDM-2 with hyperglycemia - Hb A1c 6.0%. -Continue with sliding scale insulin .   CBG (last 3)  Recent Labs    10/18/23 1622 10/18/23 2116 10/19/23 0828  GLUCAP 160* 176* 162*   Continue glargine 13 units daily Continue moderate  SSI, NovoLog  2 units 3 times daily AC    Paroxysmal A-fib:: Rate controlled. -Continue verapamil  - Continue Eliquis  for anticoagulation.   Essential hypertension: Normotensive. - Continue verapamil    Obstructive Sleep Apnea  - Trach removed. CPAP    Anxiety/Bipolar disorder  -  Continue Paxil .     Hypokalemia/hyperkalemia: K5.8. - Potassium stable   Hyponatremia:  -follow serum osmolarity, urine osmolarity, UNA for further workup -Continue to monitor   Dysphagia:  - Resolved.  PEG tube removed.  On regular diet.    RN Pressure Injury Documentation: Pressure Injury 07/05/23 Heel Left;Right Unstageable - Full thickness tissue loss in which the base of the injury is covered by slough (yellow, tan, gray, green or brown) and/or eschar (tan, brown or black) in the wound bed. (Active)  07/05/23 1108  Location: Heel  Location Orientation: Left;Right  Staging: Unstageable - Full thickness tissue loss in which the base of the injury is covered by slough (yellow, tan, gray, green or brown) and/or eschar (tan, brown or black) in the wound bed.  Wound Description (Comments):   DO NOT USE:  Present on Admission: Yes  Dressing Type Other (Comment) 10/18/23 1930     Pressure Injury 07/11/23 Sacrum Mid Unstageable - Full thickness tissue loss in which the base of the injury is covered by slough (yellow, tan, gray, green or brown) and/or eschar (tan, brown or black) in the wound bed. (Active)  07/11/23 1600  Location: Sacrum  Location Orientation: Mid  Staging: Unstageable - Full thickness tissue loss in which the base of the injury is covered by slough (yellow, tan, gray, green or brown) and/or eschar (tan, brown or black) in the wound bed.  Wound Description (Comments):   DO NOT USE:  Present on Admission: No  Dressing Type Foam - Lift dressing to assess site every shift 10/19/23 0848     Pressure Injury 07/27/23 Buttocks Right;Lower (Active)  07/27/23 1700  Location: Buttocks   Location Orientation: Right;Lower  Staging:   Wound Description (Comments):   DO NOT USE:  Present on Admission: Yes  Dressing Type None 10/13/23 0515     Pressure Injury 08/25/23 Buttocks Left Stage 2 -  Partial thickness loss of dermis presenting as a shallow open injury with a red, pink wound bed without slough. (Active)  08/25/23   Location: Buttocks  Location Orientation: Left  Staging: Stage 2 -  Partial thickness loss of dermis presenting as a shallow open injury with a red, pink wound bed without slough.  Wound Description (Comments):   DO NOT USE:  Present on Admission: No  Dressing Type None 10/13/23 0515    Severe protein calorie malnutrition  Nutrition Problem: Severe Malnutrition Etiology: acute illness (prolonged illness,  interuptions in feeding) Signs/Symptoms: moderate fat depletion, moderate muscle depletion, percent weight loss (9.6% x 1 month) Percent weight loss: 9.6 % Interventions: Ensure Enlive (each supplement provides 350kcal and 20 grams of protein), MVI, Liberalize Diet, Prostat, Juven  Obesity class I Estimated body mass index is 30.5 kg/m as calculated from the following:   Height as of this encounter: 6' (1.829 m).   Weight as of this encounter: 102 kg.  Code Status: full  DVT Prophylaxis:  SCDs Start: 10/13/23 1123 Place and maintain sequential compression device Start: 07/14/23 1033 apixaban  (ELIQUIS ) tablet 5 mg   Level of Care: Level of care: Telemetry Medical Family Communication:  Disposition Plan:      Remains inpatient appropriate:   TBD    Procedures:    Consultants:     Antimicrobials:   Anti-infectives (From admission, onward)    Start     Dose/Rate Route Frequency Ordered Stop   10/23/23 0800  doxycycline  200mg  in 20 mL SWI irrigation / sclerosing solution for IR       Note to Pharmacy: TO IR ON 8/7   200 mg Irrigation To Radiology 10/16/23 1257 10/24/23 0800   10/23/23 0800  doxycycline  200mg  in 20 mL SWI irrigation /  sclerosing solution for IR       Note to Pharmacy: TO IR ON 8/7 FOR PROCEDURE   200 mg Irrigation To Radiology 10/16/23 1258 10/24/23 0800   10/18/23 1000  voriconazole  (VFEND ) tablet 450 mg        450 mg Oral Daily 10/17/23 1457 10/22/23 0959   10/17/23 2200  voriconazole  (VFEND ) tablet 400 mg        400 mg Oral Daily at bedtime 10/17/23 1457 10/22/23 2159   10/13/23 1400  ceFAZolin  (ANCEF ) IVPB 2g/100 mL premix        2 g 200 mL/hr over 30 Minutes Intravenous Every 8 hours 10/13/23 1254 10/14/23 0618   10/13/23 0937  vancomycin  (VANCOCIN ) powder  Status:  Discontinued          As needed 10/13/23 0937 10/13/23 1002   10/10/23 0900  doxycycline  200mg  in 20 mL SWI irrigation / sclerosing solution for IR       Note to Pharmacy: For irrigation only. WARNING - SCLEROSING AGENT  #5 vials sent to IR for procedure   500 mg Irrigation To Radiology 10/07/23 1418 10/10/23 1015   10/10/23 0000  doxycycline  (VIBRAMYCIN ) 500 mg in dextrose  5 % 250 mL IVPB  Status:  Discontinued        500 mg 125 mL/hr over 120 Minutes Intravenous To Radiology 10/07/23 1402 10/07/23 1409   10/10/23 0000  doxycycline  200mg  in 20 mL SWI irrigation / sclerosing solution for IR  Status:  Discontinued        200 mg Irrigation To Radiology 10/07/23 1418 10/07/23 1456   10/10/23 0000  doxycycline  200mg  in 20 mL SWI irrigation / sclerosing solution for IR  Status:  Discontinued        200 mg Irrigation To Radiology 10/07/23 1440 10/07/23 1456   10/02/23 2200  linezolid  (ZYVOX ) tablet 600 mg        600 mg Oral Every 12 hours 10/01/23 0852 10/09/23 2108   10/02/23 1000  linezolid  (ZYVOX ) tablet 600 mg  Status:  Discontinued        600 mg Oral Every 12 hours 10/01/23 0723 10/01/23 0852   09/30/23 1300  vancomycin  variable dose per unstable renal function (pharmacist dosing)  Status:  Discontinued  Does not apply See admin instructions 09/30/23 1301 10/01/23 0725   09/29/23 1145  amoxicillin -clavulanate (AUGMENTIN )  875-125 MG per tablet 1 tablet        1 tablet Oral Every 12 hours 09/29/23 1047 10/09/23 2108   09/27/23 0200  vancomycin  (VANCOREADY) IVPB 1500 mg/300 mL        1,500 mg 150 mL/hr over 120 Minutes Intravenous Every 12 hours 09/26/23 1402 09/30/23 1448   09/26/23 1500  cefTRIAXone  (ROCEPHIN ) 2 g in sodium chloride  0.9 % 100 mL IVPB  Status:  Discontinued        2 g 200 mL/hr over 30 Minutes Intravenous Every 24 hours 09/26/23 1402 09/29/23 1047   09/26/23 1315  vancomycin  (VANCOREADY) IVPB 2000 mg/400 mL        2,000 mg 200 mL/hr over 120 Minutes Intravenous  Once 09/26/23 1220 09/26/23 1656   09/26/23 1230  metroNIDAZOLE  (FLAGYL ) IVPB 500 mg  Status:  Discontinued        500 mg 100 mL/hr over 60 Minutes Intravenous Every 12 hours 09/26/23 1137 09/29/23 1138   09/22/23 2200  voriconazole  (VFEND ) tablet 400 mg  Status:  Discontinued        400 mg Oral Every 12 hours 09/22/23 1521 10/17/23 1457   09/11/23 2200  acyclovir  (ZOVIRAX ) 200 MG capsule 400 mg       Note to Pharmacy: Give after dialysis the pm dose, on dialysis days   400 mg Oral 2 times daily 09/11/23 1551     09/11/23 2200  voriconazole  (VFEND ) tablet 350 mg  Status:  Discontinued        350 mg Oral Every 12 hours 09/11/23 1551 09/22/23 1521   09/07/23 1000  acyclovir  (ZOVIRAX ) 200 MG capsule 400 mg  Status:  Discontinued       Note to Pharmacy: Give after dialysis the pm dose, on dialysis days   400 mg Per Tube 2 times daily 09/07/23 0825 09/11/23 1551   09/03/23 1200  vancomycin  (VANCOCIN ) IVPB 1000 mg/200 mL premix  Status:  Discontinued        1,000 mg 200 mL/hr over 60 Minutes Intravenous Every M-W-F (Hemodialysis) 09/02/23 1021 09/03/23 0942   09/03/23 0942  vancomycin  variable dose per unstable renal function (pharmacist dosing)  Status:  Discontinued         Does not apply See admin instructions 09/03/23 0942 09/03/23 0945   09/02/23 0915  vancomycin  (VANCOCIN ) IVPB 1000 mg/200 mL premix        1,000 mg 200 mL/hr over  60 Minutes Intravenous  Once 09/02/23 0830 09/02/23 1215   09/01/23 0834  ceFAZolin  (ANCEF ) IVPB 2g/100 mL premix        over 30 Minutes Intravenous Continuous PRN 09/01/23 0834 09/01/23 0834   08/29/23 0914  vancomycin  variable dose per unstable renal function (pharmacist dosing)  Status:  Discontinued         Does not apply See admin instructions 08/29/23 0914 09/02/23 1021   08/27/23 1400  vancomycin  (VANCOCIN ) IVPB 1000 mg/200 mL premix        1,000 mg 200 mL/hr over 60 Minutes Intravenous  Once 08/27/23 0920 08/27/23 1653   08/25/23 1200  vancomycin  (VANCOCIN ) IVPB 1000 mg/200 mL premix        1,000 mg 200 mL/hr over 60 Minutes Intravenous Every M-W-F (Hemodialysis) 08/24/23 1405 08/29/23 1240   08/24/23 1445  vancomycin  (VANCOREADY) IVPB 2000 mg/400 mL        2,000 mg 200 mL/hr over 120 Minutes  Intravenous  Once 08/24/23 1353 08/25/23 0323   08/14/23 0600  ceFAZolin  (ANCEF ) IVPB 2g/100 mL premix        2 g 200 mL/hr over 30 Minutes Intravenous To Radiology 08/13/23 1055 08/14/23 0606   08/09/23 1000  acyclovir  (ZOVIRAX ) 200 MG capsule 200 mg  Status:  Discontinued       Note to Pharmacy: Give after dialysis the pm dose, on dialysis days   200 mg Per Tube 2 times daily 08/08/23 2358 09/07/23 0825   08/09/23 0100  acyclovir  (ZOVIRAX ) 200 MG capsule 200 mg       Note to Pharmacy: Give after dialysis the pm dose, on dialysis days   200 mg Per Tube  Once 08/09/23 0009 08/09/23 0013   08/08/23 2345  acyclovir  (ZOVIRAX ) 200 MG capsule 200 mg  Status:  Discontinued       Note to Pharmacy: Give after dialysis the pm dose, on dialysis days   200 mg Oral 2 times daily 08/08/23 2257 08/08/23 2358   08/07/23 1530  ceFAZolin  (ANCEF ) IVPB 2g/100 mL premix        over 30 Minutes Intravenous Continuous PRN 08/07/23 1555 08/07/23 1530   08/05/23 2200  voriconazole  (VFEND ) tablet 350 mg  Status:  Discontinued        350 mg Per Tube Every 12 hours 08/05/23 1511 09/11/23 1551   08/01/23 1200   vancomycin  (VANCOCIN ) IVPB 1000 mg/200 mL premix        1,000 mg 200 mL/hr over 60 Minutes Intravenous Every M-W-F (Hemodialysis) 07/31/23 0649 08/01/23 1652   07/30/23 1200  vancomycin  (VANCOCIN ) IVPB 1000 mg/200 mL premix        1,000 mg 200 mL/hr over 60 Minutes Intravenous Every M-W-F (Hemodialysis) 07/30/23 0851 07/30/23 1241   07/29/23 0900  vancomycin  (VANCOCIN ) IVPB 1000 mg/200 mL premix        1,000 mg 200 mL/hr over 60 Minutes Intravenous  Once 07/29/23 0812 07/29/23 0943   07/28/23 1204  vancomycin  variable dose per unstable renal function (pharmacist dosing)  Status:  Discontinued         Does not apply See admin instructions 07/28/23 1204 07/31/23 0649   07/28/23 1000  voriconazole  (VFEND ) 350 mg in sodium chloride  0.9 % 100 mL IVPB  Status:  Discontinued       Placed in Followed by Linked Group   4 mg/kg  88.6 kg (Adjusted) 67.5 mL/hr over 120 Minutes Intravenous Every 12 hours 07/28/23 0748 08/05/23 1511   07/27/23 1000  voriconazole  (VFEND ) 400 mg in sodium chloride  0.9 % 100 mL IVPB  Status:  Discontinued       Placed in Followed by Linked Group   4 mg/kg  100.6 kg 70 mL/hr over 120 Minutes Intravenous Every 12 hours 07/26/23 0826 07/28/23 0748   07/27/23 0945  vancomycin  (VANCOREADY) IVPB 2000 mg/400 mL        2,000 mg 200 mL/hr over 120 Minutes Intravenous  Once 07/27/23 0849 07/27/23 1237   07/26/23 1515  Ampicillin -Sulbactam (UNASYN ) 3 g in sodium chloride  0.9 % 100 mL IVPB  Status:  Discontinued        3 g 200 mL/hr over 30 Minutes Intravenous Every 8 hours 07/26/23 1418 07/27/23 0830   07/26/23 0915  voriconazole  (VFEND ) 600 mg in sodium chloride  0.9 % 150 mL IVPB       Placed in Followed by Linked Group   6 mg/kg  100.6 kg 105 mL/hr over 120 Minutes Intravenous Every 12 hours 07/26/23  9173 07/27/23 0048   07/26/23 0900  Ampicillin -Sulbactam (UNASYN ) 3 g in sodium chloride  0.9 % 100 mL IVPB  Status:  Discontinued        3 g 200 mL/hr over 30 Minutes  Intravenous Every 12 hours 07/26/23 0826 07/26/23 1418   07/23/23 1000  levofloxacin  (LEVAQUIN ) IVPB 500 mg  Status:  Discontinued       Placed in Followed by Linked Group   500 mg 100 mL/hr over 60 Minutes Intravenous Every 24 hours 07/22/23 1213 07/23/23 1001   07/23/23 1000  levofloxacin  (LEVAQUIN ) IVPB 500 mg  Status:  Discontinued       Placed in Followed by Linked Group   500 mg 100 mL/hr over 60 Minutes Intravenous Every 24 hours 07/23/23 1001 07/28/23 0804   07/22/23 1300  levofloxacin  (LEVAQUIN ) IVPB 750 mg       Placed in Followed by Linked Group   750 mg 100 mL/hr over 90 Minutes Intravenous  Once 07/22/23 1213 07/22/23 1542   07/21/23 1600  voriconazole  (VFEND ) 300 mg in sodium chloride  0.9 % 100 mL IVPB  Status:  Discontinued        300 mg 65 mL/hr over 120 Minutes Intravenous Every 12 hours 07/21/23 1430 07/22/23 1415   07/21/23 1000  voriconazole  (VFEND ) tablet 200 mg  Status:  Discontinued        200 mg Oral Every 12 hours 07/20/23 0944 07/21/23 1015   07/20/23 1030  voriconazole  (VFEND ) 530 mg in sodium chloride  0.9 % 150 mL IVPB        6 mg/kg  88.6 kg (Adjusted) 101.5 mL/hr over 120 Minutes Intravenous Every 12 hours 07/20/23 0944 07/21/23 0030   07/19/23 1400  meropenem  (MERREM ) 1 g in sodium chloride  0.9 % 100 mL IVPB  Status:  Discontinued        1 g 200 mL/hr over 30 Minutes Intravenous Every 8 hours 07/19/23 1143 07/22/23 1213   07/18/23 0600  meropenem  (MERREM ) 1 g in sodium chloride  0.9 % 100 mL IVPB  Status:  Discontinued        1 g 200 mL/hr over 30 Minutes Intravenous Every 24 hours 07/17/23 0916 07/19/23 1143   07/17/23 0700  meropenem  (MERREM ) 1 g in sodium chloride  0.9 % 100 mL IVPB  Status:  Discontinued        1 g 200 mL/hr over 30 Minutes Intravenous Every 12 hours 07/17/23 0601 07/17/23 0916   07/15/23 1300  vancomycin  (VANCOCIN ) IVPB 1000 mg/200 mL premix  Status:  Discontinued        1,000 mg 200 mL/hr over 60 Minutes Intravenous Every 24  hours 07/14/23 1129 07/22/23 1319   07/14/23 1215  vancomycin  (VANCOREADY) IVPB 1750 mg/350 mL        1,750 mg 175 mL/hr over 120 Minutes Intravenous  Once 07/14/23 1129 07/14/23 1449   07/09/23 1800  piperacillin -tazobactam (ZOSYN ) IVPB 3.375 g  Status:  Discontinued        3.375 g 12.5 mL/hr over 240 Minutes Intravenous Every 12 hours 07/09/23 0756 07/09/23 0844   07/09/23 1400  piperacillin -tazobactam (ZOSYN ) IVPB 3.375 g  Status:  Discontinued        3.375 g 12.5 mL/hr over 240 Minutes Intravenous Every 8 hours 07/09/23 0844 07/17/23 0557   07/08/23 1300  piperacillin -tazobactam (ZOSYN ) IVPB 3.375 g  Status:  Discontinued        3.375 g 12.5 mL/hr over 240 Minutes Intravenous Every 8 hours 07/08/23 1129 07/09/23 0756   07/05/23 2200  azithromycin  (ZITHROMAX ) 500 mg in sodium chloride  0.9 % 250 mL IVPB        500 mg 250 mL/hr over 60 Minutes Intravenous Daily at bedtime 07/05/23 2022 07/06/23 2302   07/05/23 2115  azithromycin  (ZITHROMAX ) 250 mg in dextrose  5 % 125 mL IVPB  Status:  Discontinued       Note to Pharmacy: Missed dose this morning   250 mg 127.5 mL/hr over 60 Minutes Intravenous Every 24 hours 07/05/23 2016 07/05/23 2021   07/04/23 0900  fluconazole  (DIFLUCAN ) IVPB 200 mg  Status:  Discontinued        200 mg 100 mL/hr over 60 Minutes Intravenous Every 48 hours 07/04/23 0722 07/09/23 0809   07/03/23 0600  cefTRIAXone  (ROCEPHIN ) 1 g in sodium chloride  0.9 % 100 mL IVPB  Status:  Discontinued        1 g 200 mL/hr over 30 Minutes Intravenous Every 24 hours 07/03/23 0508 07/08/23 1104   07/03/23 0600  azithromycin  (ZITHROMAX ) tablet 250 mg  Status:  Discontinued        250 mg Oral Daily 07/03/23 0508 07/05/23 2016          Medications  (feeding supplement) PROSource Plus  30 mL Oral TID BM   acyclovir   400 mg Oral BID   apixaban   5 mg Oral BID   collagenase    Topical Daily   docusate sodium   100 mg Oral BID   [START ON 10/23/2023] doxycycline  200mg  in 20 mL SWI  irrigation / sclerosing solution for IR  200 mg Irrigation to XRAY   [START ON 10/23/2023] doxycycline  200mg  in 20 mL SWI irrigation / sclerosing solution for IR  200 mg Irrigation to XRAY   epoetin  alfa  40,000 Units Subcutaneous Q Wed-1800   feeding supplement  237 mL Oral BID WC   guaiFENesin   600 mg Oral BID   heparin  lock flush  500 Units Intravenous Once   hydrALAZINE   25 mg Oral Q8H   insulin  aspart  0-15 Units Subcutaneous TID AC & HS   insulin  aspart  2 Units Subcutaneous TID AC & HS   insulin  glargine-yfgn  13 Units Subcutaneous Q24H   lidocaine   1 patch Transdermal Q24H   montelukast   5 mg Oral QHS   multivitamin with minerals  1 tablet Oral Daily   nutrition supplement (JUVEN)  1 packet Oral BID AC   mouth rinse  15 mL Mouth Rinse 4 times per day   pantoprazole   40 mg Oral BID   PARoxetine   40 mg Oral Daily   polyethylene glycol  17 g Oral BID   senna  1 tablet Oral Daily   umeclidinium-vilanterol  1 puff Inhalation Daily   valproic  acid  750 mg Oral BID   verapamil   120 mg Oral Daily   voriconazole   400 mg Oral QHS   voriconazole   450 mg Oral Daily      Subjective:  , dEddie [name] was seen and examined today.  No acute complaints, awaiting chemo.  Pain controlled.  No dizziness, chest pain, shortness of breath, fever chills abdominal pain.    Objective:   Vitals:   10/18/23 1620 10/18/23 2000 10/19/23 0512 10/19/23 0828  BP: (!) 124/97 121/83 117/88 122/85  Pulse: (!) 103 94 96 97  Resp: 18 18 16 16   Temp: 98.3 F (36.8 C) 97.8 F (36.6 C) 98.3 F (36.8 C) 97.9 F (36.6 C)  TempSrc: Oral Oral Axillary Oral  SpO2: 97% 98% 98% 94%  Weight:  Height:        Intake/Output Summary (Last 24 hours) at 10/19/2023 1056 Last data filed at 10/19/2023 0800 Gross per 24 hour  Intake 1360 ml  Output 3585 ml  Net -2225 ml     Wt Readings from Last 3 Encounters:  10/18/23 102 kg  06/05/23 113.7 kg  01/29/23 117.9 kg   Physical Exam General: Alert and oriented  x 3, NAD, pleasant Cardiovascular: S1 S2 clear, RRR.  Respiratory: CTAB, no wheezing, rales or rhonchi Gastrointestinal: Soft, nontender, nondistended, NBS Ext: no pedal edema bilaterally, LUE above elbow amputation Neuro: no new deficits Psych: Normal affect      Data Reviewed:  I have personally reviewed following labs    CBC Lab Results  Component Value Date   WBC 6.1 10/19/2023   RBC 2.98 (L) 10/19/2023   HGB 9.4 (L) 10/19/2023   HCT 30.0 (L) 10/19/2023   MCV 100.7 (H) 10/19/2023   MCH 31.5 10/19/2023   PLT 338 10/19/2023   MCHC 31.3 10/19/2023   RDW 19.0 (H) 10/19/2023   LYMPHSABS 1.2 10/06/2023   MONOABS 0.9 10/06/2023   EOSABS 0.1 10/06/2023   BASOSABS 0.0 10/06/2023     Last metabolic panel Lab Results  Component Value Date   NA 130 (L) 10/19/2023   K 4.4 10/19/2023   CL 97 (L) 10/19/2023   CO2 24 10/19/2023   BUN 38 (H) 10/19/2023   CREATININE 1.22 10/19/2023   GLUCOSE 122 (H) 10/19/2023   GFRNONAA >60 10/19/2023   GFRAA >60 12/27/2017   CALCIUM  8.5 (L) 10/19/2023   PHOS 4.3 10/17/2023   PROT 5.5 (L) 10/19/2023   ALBUMIN  2.3 (L) 10/19/2023   LABGLOB 2.6 09/12/2023   AGRATIO 0.9 09/12/2023   BILITOT 0.4 10/19/2023   ALKPHOS 62 10/19/2023   AST 9 (L) 10/19/2023   ALT 6 10/19/2023   ANIONGAP 9 10/19/2023    CBG (last 3)  Recent Labs    10/18/23 1622 10/18/23 2116 10/19/23 0828  GLUCAP 160* 176* 162*      Coagulation Profile: No results for input(s): INR, PROTIME in the last 168 hours.   Radiology Studies: I have personally reviewed the imaging studies  No results found.      Nydia Distance M.D. [facility] 10/19/2023, 10:56 AM  Available via Epic secure chat 7am-7pm After 7 pm, please refer to night coverage provider listed on amion.

## 2023-10-19 NOTE — Plan of Care (Signed)
 Problem: Education: Goal: Knowledge of General Education information will improve Description: Including pain rating scale, medication(s)/side effects and non-pharmacologic comfort measures Outcome: Progressing   Problem: Health Behavior/Discharge Planning: Goal: Ability to manage health-related needs will improve Outcome: Progressing   Problem: Clinical Measurements: Goal: Ability to maintain clinical measurements within normal limits will improve Outcome: Progressing Goal: Will remain free from infection Outcome: Progressing Goal: Diagnostic test results will improve Outcome: Progressing Goal: Respiratory complications will improve Outcome: Progressing Goal: Cardiovascular complication will be avoided Outcome: Progressing   Problem: Activity: Goal: Risk for activity intolerance will decrease Outcome: Progressing   Problem: Nutrition: Goal: Adequate nutrition will be maintained Outcome: Progressing   Problem: Coping: Goal: Level of anxiety will decrease Outcome: Progressing   Problem: Elimination: Goal: Will not experience complications related to bowel motility Outcome: Progressing Goal: Will not experience complications related to urinary retention Outcome: Progressing   Problem: Pain Managment: Goal: General experience of comfort will improve and/or be controlled Outcome: Progressing   Problem: Safety: Goal: Ability to remain free from injury will improve Outcome: Progressing   Problem: Skin Integrity: Goal: Risk for impaired skin integrity will decrease Outcome: Progressing   Problem: Education: Goal: Ability to describe self-care measures that may prevent or decrease complications (Diabetes Survival Skills Education) will improve Outcome: Progressing Goal: Individualized Educational Video(s) Outcome: Progressing   Problem: Coping: Goal: Ability to adjust to condition or change in health will improve Outcome: Progressing   Problem: Fluid  Volume: Goal: Ability to maintain a balanced intake and output will improve Outcome: Progressing   Problem: Health Behavior/Discharge Planning: Goal: Ability to identify and utilize available resources and services will improve Outcome: Progressing Goal: Ability to manage health-related needs will improve Outcome: Progressing   Problem: Metabolic: Goal: Ability to maintain appropriate glucose levels will improve Outcome: Progressing   Problem: Nutritional: Goal: Maintenance of adequate nutrition will improve Outcome: Progressing Goal: Progress toward achieving an optimal weight will improve Outcome: Progressing   Problem: Skin Integrity: Goal: Risk for impaired skin integrity will decrease Outcome: Progressing   Problem: Tissue Perfusion: Goal: Adequacy of tissue perfusion will improve Outcome: Progressing   Problem: Education: Goal: Knowledge about tracheostomy care/management will improve Outcome: Progressing   Problem: Activity: Goal: Ability to tolerate increased activity will improve Outcome: Progressing   Problem: Health Behavior/Discharge Planning: Goal: Ability to manage tracheostomy will improve Outcome: Progressing   Problem: Education: Goal: Knowledge of General Education information will improve Description: Including pain rating scale, medication(s)/side effects and non-pharmacologic comfort measures Outcome: Progressing   Problem: Health Behavior/Discharge Planning: Goal: Ability to manage health-related needs will improve Outcome: Progressing   Problem: Clinical Measurements: Goal: Ability to maintain clinical measurements within normal limits will improve Outcome: Progressing Goal: Will remain free from infection Outcome: Progressing Goal: Diagnostic test results will improve Outcome: Progressing Goal: Respiratory complications will improve Outcome: Progressing Goal: Cardiovascular complication will be avoided Outcome: Progressing    Problem: Activity: Goal: Risk for activity intolerance will decrease Outcome: Progressing   Problem: Nutrition: Goal: Adequate nutrition will be maintained Outcome: Progressing   Problem: Coping: Goal: Level of anxiety will decrease Outcome: Progressing   Problem: Elimination: Goal: Will not experience complications related to bowel motility Outcome: Progressing Goal: Will not experience complications related to urinary retention Outcome: Progressing   Problem: Pain Managment: Goal: General experience of comfort will improve and/or be controlled Outcome: Progressing   Problem: Safety: Goal: Ability to remain free from injury will improve Outcome: Progressing   Problem: Skin Integrity: Goal: Risk for impaired  skin integrity will decrease Outcome: Progressing

## 2023-10-20 DIAGNOSIS — C9000 Multiple myeloma not having achieved remission: Secondary | ICD-10-CM | POA: Diagnosis not present

## 2023-10-20 DIAGNOSIS — I5032 Chronic diastolic (congestive) heart failure: Secondary | ICD-10-CM | POA: Diagnosis not present

## 2023-10-20 DIAGNOSIS — A419 Sepsis, unspecified organism: Secondary | ICD-10-CM | POA: Diagnosis not present

## 2023-10-20 DIAGNOSIS — R6521 Severe sepsis with septic shock: Secondary | ICD-10-CM | POA: Diagnosis not present

## 2023-10-20 DIAGNOSIS — R531 Weakness: Secondary | ICD-10-CM | POA: Diagnosis not present

## 2023-10-20 LAB — COMPREHENSIVE METABOLIC PANEL WITH GFR
ALT: 7 U/L (ref 0–44)
AST: 8 U/L — ABNORMAL LOW (ref 15–41)
Albumin: 2.3 g/dL — ABNORMAL LOW (ref 3.5–5.0)
Alkaline Phosphatase: 57 U/L (ref 38–126)
Anion gap: 9 (ref 5–15)
BUN: 42 mg/dL — ABNORMAL HIGH (ref 6–20)
CO2: 25 mmol/L (ref 22–32)
Calcium: 9.1 mg/dL (ref 8.9–10.3)
Chloride: 99 mmol/L (ref 98–111)
Creatinine, Ser: 1.32 mg/dL — ABNORMAL HIGH (ref 0.61–1.24)
GFR, Estimated: >60 mL/min (ref >60)
Glucose, Bld: 109 mg/dL — ABNORMAL HIGH (ref 70–99)
Potassium: 4.3 mmol/L (ref 3.5–5.1)
Sodium: 133 mmol/L — ABNORMAL LOW (ref 135–145)
Total Bilirubin: 0.5 mg/dL (ref 0.0–1.2)
Total Protein: 5.8 g/dL — ABNORMAL LOW (ref 6.5–8.1)

## 2023-10-20 LAB — GLUCOSE, CAPILLARY
Glucose-Capillary: 93 mg/dL (ref 70–99)
Glucose-Capillary: 135 mg/dL — ABNORMAL HIGH (ref 70–99)
Glucose-Capillary: 89 mg/dL (ref 70–99)
Glucose-Capillary: 166 mg/dL — ABNORMAL HIGH (ref 70–99)

## 2023-10-20 LAB — CBC
HCT: 30.3 % — ABNORMAL LOW (ref 39.0–52.0)
Hemoglobin: 9.5 g/dL — ABNORMAL LOW (ref 13.0–17.0)
MCH: 31.8 pg (ref 26.0–34.0)
MCHC: 31.4 g/dL (ref 30.0–36.0)
MCV: 101.3 fL — ABNORMAL HIGH (ref 80.0–100.0)
Platelets: 326 K/uL (ref 150–400)
RBC: 2.99 MIL/uL — ABNORMAL LOW (ref 4.22–5.81)
RDW: 18.7 % — ABNORMAL HIGH (ref 11.5–15.5)
WBC: 5.3 K/uL (ref 4.0–10.5)
nRBC: 0 % (ref 0.0–0.2)

## 2023-10-20 NOTE — Progress Notes (Signed)
 Physical Therapy Treatment Patient Details Name: Frank [name]. MRN: [id number] DOB: [birth year] Today's Date: 10/20/2023   History of Present Illness 58 y.o. male transferred from [facility] 07/03/23 to [facility] for management of newly diagnosed plasma cell myeloma with weakness and AMS. Pt also with AKI and metabolic encephalopathy. 5/10 transfer to [facility] for iHD. 4/22 Intubated and chemo initiated. 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 5/6 IVIG started. 5/8-6/11 trach. 5/12 iHD initiated. 5/29 PEG placed. 6/5 chemo initiated. PMHx:Lt THA, carpal tunnel syndrome, Lt TKA, Lt humerus fx, PAF, HTN, HLD, bipolar disorder, obesity, T2DM    PT Comments  STAR PT/OT Session: PT assisted PT with Stedy transfer from bed to [facility]. When pt finished and OT assisted with pericare PT returned for mobility training. Unfortunately, pt with increased fatigue from sitting up. Plan for slide board transfer to wheelchair but pt requires total A to come to upright to attempt to don shorts. Decided to have pt stand again in [place] with total Ax2 to transfer back to bed. Will attempt to transfer to wheelchair tomorrow. PT/OT to come before lunch in hopes that he will have improved strength.     If plan is discharge home, recommend the following: Two people to help with walking and/or transfers;Assistance with cooking/housework;Assist for transportation;Two people to help with bathing/dressing/bathroom;Direct supervision/assist for medications management;Direct supervision/assist for financial management;Help with stairs or ramp for entrance;Assistance with feeding   Can travel by private vehicle     No  Equipment Recommendations  Hospital bed;Hoyer lift;Wheelchair (measurements PT);Wheelchair cushion (measurements PT);BSC/3in1       Precautions / Restrictions Precautions Precautions: Fall Recall of Precautions/Restrictions: Impaired Restrictions Weight Bearing Restrictions Per Provider Order: Yes LUE Weight Bearing Per  Provider Order: Non weight bearing Other Position/Activity Restrictions: ROM to tolerance     Mobility  Bed Mobility Overal bed mobility: Needs Assistance Bed Mobility: Supine to Sit, Sit to Supine     Supine to sit: Mod assist, HOB elevated, Used rails Sit to supine: Total assist, +2 for physical assistance   General bed mobility comments: assistance with trunk to get to EOB and total assist for trunk and BLEs    Transfers Overall transfer level: Needs assistance Equipment used: Ambulation equipment used Transfers: Sit to/from Stand, Bed to chair/wheelchair/BSC Sit to Stand: +2 physical assistance, Max assist, +2 safety/equipment, Total assist           General transfer comment: Stedy used for transfer to [facility] with max assist +2 to stand from EOB and stedy pads and total assist +2 from [facility] Transfer via Lift Equipment: Stedy      Balance Overall balance assessment: Needs assistance Sitting-balance support: Feet supported, No upper extremity supported Sitting balance-Leahy Scale: Fair Sitting balance - Comments: EOB   Standing balance support: Single extremity supported, During functional activity, Reliant on assistive device for balance Standing balance-Leahy Scale: Zero Standing balance comment: reliant on Stedy for support                            Communication Communication Communication: Impaired Factors Affecting Communication: Reduced clarity of speech  Cognition Arousal: Alert Behavior During Therapy: WFL for tasks assessed/performed                             Following commands: Impaired Following commands impaired: Follows one step commands with increased time    Cueing Cueing Techniques: Verbal cues, Tactile cues,  Visual cues     General Comments General comments (skin integrity, edema, etc.): VSS on RA      Pertinent Vitals/Pain Pain Assessment Pain Assessment: Faces Faces Pain Scale: Hurts even more Pain Location: LUE  and right knee Pain Descriptors / Indicators: Discomfort, Aching, Guarding Pain Intervention(s): Limited activity within patient's tolerance     PT Goals (current goals can now be found in the care plan section) Acute Rehab PT Goals PT Goal Formulation: With patient Time For Goal Achievement: 10/23/23 Potential to Achieve Goals: Fair Progress towards PT goals: Progressing toward goals    Frequency    Min 5X/week           Co-evaluation PT/OT/SLP Co-Evaluation/Treatment: Yes Reason for Co-Treatment: For patient/therapist safety;To address functional/ADL transfers PT goals addressed during session: Mobility/safety with mobility;Balance OT goals addressed during session: ADL's and self-care;Strengthening/ROM      AM-PAC PT 6 Clicks Mobility   Outcome Measure  Help needed turning from your back to your side while in a flat bed without using bedrails?: A Lot Help needed moving from lying on your back to sitting on the side of a flat bed without using bedrails?: Total Help needed moving to and from a bed to a chair (including a wheelchair)?: Total Help needed standing up from a chair using your arms (e.g., wheelchair or bedside chair)?: Total Help needed to walk in hospital room?: Total Help needed climbing 3-5 steps with a railing? : Total 6 Click Score: 7    End of Session Equipment Utilized During Treatment: Gait belt Activity Tolerance: Patient limited by pain Patient left: in bed;with call bell/phone within reach;with family/visitor present Nurse Communication: Mobility status PT Visit Diagnosis: Other abnormalities of gait and mobility (R26.89);Muscle weakness (generalized) (M62.81);Difficulty in walking, not elsewhere classified (R26.2)     Time: 8583-8561 PT Time Calculation (min) (ACUTE ONLY): 22 min  Charges:    $Therapeutic Activity: 8-22 mins PT General Charges $$ ACUTE PT VISIT: 1 Visit                     Grant Swager B. Fleeta Lapidus PT, DPT Acute  Rehabilitation Services Please use secure chat or  Call Office [phone]    [name] [facility] 10/20/2023, 4:42 PM

## 2023-10-20 NOTE — Progress Notes (Signed)
 [facility]                                                                              Frank [name], is a 58 y.o. male, DOB - 04-18-1965, FMW:[id number] Admit date - 07/03/2023    Outpatient Primary MD for the patient is Frank Clem Pitts, MD  LOS - 109  days  No chief complaint on file.      Brief summary   58 year old PMH OSA CPAP, pathological fracture and multiple ORIF left Humerus likely due to multiple myeloma, A-fib not on anticoagulation, hyperlipidemia, tobacco use disorder who was feeling weak for 1 week, admitted to [facility] with concern of sepsis, community-acquired pneumonia and acute encephalopathy. Patient was found to have lytic lesions, he was transferred to [facility] for oncology treatment. In the meantime he developed encephalopathy, acute renal failure. He remained in the ICU for 36 days. Underwent tracheostomy. He was also started on HD after CRRT,off of HD now. Subsequently PEG tube placed 5/29 and IR drain placement of right thigh hip infected hematoma on 7/11 . S/p PEG tube removal. Needs Port-A-Cath placement (for outpatient chemo), either during this hospitalization or as an outpatient. F/u IR recommendations for drain management. Started on heparin  drip on 7/17 because of b/l LE DVT.  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 on 7/28.  Surgical EBL 750 cc. Oncology and IR following.   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 - continue pain management, currently controlled. - Wound VAC LUE removed on 8/1 - Per Ortho, dry dressing changes, continue eliquis    Infected right flank/gluteal hematoma -Underwent IR drain placement for infected Hematoma on 7/11.  -Drain exchanged on 7/25. -Completed antibiotic course on 7/26, cultures negative  - Continue  drain, IR following, planning repeat sclerotherapy on 8/7   Sepsis in the setting of Staph Epi Bacteremia  -blood culture with MRSE in 1 out of 4 bottles.  - Completed a course of vancomycin , 6/20, received a line holiday.   - Repeat blood culture 6/9 without growth.  Completed zyvox  on 7/26   Multiple myeloma:  - Started chemo with Velcade /Cytoxan  on 08/21/2023.  Chemo changed to Sarclisa /Kyprolis /Cytoxan  later on.  Currently on hold. -Myeloma responded well to treatment, IgG decreased from [phone] (normal range). .- Port-A-Cath placed on 7/30 by IR - Per oncology, Dr [name] to start chemotherapy tomorrow   AKI on CKD-3A/uremia:  - due to MM, hypercalcemia and ACE inhibitors?  Briefly required hemodialysis.   - AKI resolved and TDC removed.  Cr started to trend up again likely for multiple myeloma. - Creatinine slightly up    BLE DVT:  - US  venous doppler on 7/16 positive for DVT in his RLE PTV, left CFV, SFJ, and PFV. - Continue Eliquis    Left sided pleuritic chest wall pain:   - No rib fractures seen on CXR - Pain control   Aspergillosis with pneumonia  -Continue voriconazole .  End date on  8/5.   Acute Hypoxic Respiratory Failure -trach removed 6/11.  Decannulated.   Severe acute metabolic encephalopathy with hypoactive delirium, seizure disorder - Normal MRI of the brain.  LP negative for meningitis or encephalitis.  EEG negative for seizures but encephalopathy.  -Resolved   Acute postoperative blood loss anemia superimposed on anemia of renal disease:  - H/H stable    IDDM-2 with hyperglycemia - Hb A1c 6.0%. -Continue with sliding scale insulin .   CBG (last 3)  Recent Labs    10/19/23 1634 10/19/23 2205 10/20/23 0806  GLUCAP 150* 116* 93   Continue glargine 13 units daily Continue moderate SSI, NovoLog  2 units 3 times daily AC    Paroxysmal A-fib:: Rate controlled. -Continue verapamil  - Continue Eliquis  for anticoagulation.   Essential hypertension:  Normotensive. - Continue verapamil    Obstructive Sleep Apnea  - Trach removed. CPAP    Anxiety/Bipolar disorder  -  Continue Paxil .     Hypokalemia/hyperkalemia: K5.8. - Potassium stable   Hyponatremia:  - Improving   Dysphagia:  - Resolved.  PEG tube removed.  On regular diet.    RN Pressure Injury Documentation: Pressure Injury 07/05/23 Heel Left;Right Unstageable - Full thickness tissue loss in which the base of the injury is covered by slough (yellow, tan, gray, green or brown) and/or eschar (tan, brown or black) in the wound bed. (Active)  07/05/23 1108  Location: Heel  Location Orientation: Left;Right  Staging: Unstageable - Full thickness tissue loss in which the base of the injury is covered by slough (yellow, tan, gray, green or brown) and/or eschar (tan, brown or black) in the wound bed.  Wound Description (Comments):   DO NOT USE:  Present on Admission: Yes  Dressing Type Other (Comment) 10/19/23 2000     Pressure Injury 07/11/23 Sacrum Mid Unstageable - Full thickness tissue loss in which the base of the injury is covered by slough (yellow, tan, gray, green or brown) and/or eschar (tan, brown or black) in the wound bed. (Active)  07/11/23 1600  Location: Sacrum  Location Orientation: Mid  Staging: Unstageable - Full thickness tissue loss in which the base of the injury is covered by slough (yellow, tan, gray, green or brown) and/or eschar (tan, brown or black) in the wound bed.  Wound Description (Comments):   DO NOT USE:  Present on Admission: No  Dressing Type Foam - Lift dressing to assess site every shift 10/19/23 0848     Pressure Injury 07/27/23 Buttocks Right;Lower (Active)  07/27/23 1700  Location: Buttocks  Location Orientation: Right;Lower  Staging:   Wound Description (Comments):   DO NOT USE:  Present on Admission: Yes  Dressing Type None 10/13/23 0515     Pressure Injury 08/25/23 Buttocks Left Stage 2 -  Partial thickness loss of dermis  presenting as a shallow open injury with a red, pink wound bed without slough. (Active)  08/25/23   Location: Buttocks  Location Orientation: Left  Staging: Stage 2 -  Partial thickness loss of dermis presenting as a shallow open injury with a red, pink wound bed without slough.  Wound Description (Comments):   DO NOT USE:  Present on Admission: No  Dressing Type None 10/13/23 0515    Severe protein calorie malnutrition  Nutrition Problem: Severe Malnutrition Etiology: acute illness (prolonged illness, interuptions in feeding) Signs/Symptoms: moderate fat depletion, moderate muscle depletion, percent weight loss (9.6% x 1 month) Percent weight loss: 9.6 % Interventions: Ensure Enlive (each supplement provides 350kcal and 20 grams of protein), MVI,  Liberalize Diet, Prostat, Juven  Obesity class I Estimated body mass index is 30.5 kg/m as calculated from the following:   Height as of this encounter: 6' (1.829 m).   Weight as of this encounter: 102 kg.  Code Status: full  DVT Prophylaxis:  SCDs Start: 10/13/23 1123 Place and maintain sequential compression device Start: 07/14/23 1033 apixaban  (ELIQUIS ) tablet 5 mg   Level of Care: Level of care: Telemetry Medical Family Communication:  Disposition Plan:      Remains inpatient appropriate:   TBD, likely once the drain is out, repeat sclerotherapy by IR on 8/7   Procedures:    Consultants:     Antimicrobials:   Anti-infectives (From admission, onward)    Start     Dose/Rate Route Frequency Ordered Stop   10/23/23 0800  doxycycline  200mg  in 20 mL SWI irrigation / sclerosing solution for IR       Note to Pharmacy: TO IR ON 8/7   200 mg Irrigation To Radiology 10/16/23 1257 10/24/23 0800   10/23/23 0800  doxycycline  200mg  in 20 mL SWI irrigation / sclerosing solution for IR       Note to Pharmacy: TO IR ON 8/7 FOR PROCEDURE   200 mg Irrigation To Radiology 10/16/23 1258 10/24/23 0800   10/18/23 1000  voriconazole  (VFEND )  tablet 450 mg        450 mg Oral Daily 10/17/23 1457 10/22/23 0959   10/17/23 2200  voriconazole  (VFEND ) tablet 400 mg        400 mg Oral Daily at bedtime 10/17/23 1457 10/22/23 2159   10/13/23 1400  ceFAZolin  (ANCEF ) IVPB 2g/100 mL premix        2 g 200 mL/hr over 30 Minutes Intravenous Every 8 hours 10/13/23 1254 10/14/23 0618   10/13/23 0937  vancomycin  (VANCOCIN ) powder  Status:  Discontinued          As needed 10/13/23 0937 10/13/23 1002   10/10/23 0900  doxycycline  200mg  in 20 mL SWI irrigation / sclerosing solution for IR       Note to Pharmacy: For irrigation only. WARNING - SCLEROSING AGENT  #5 vials sent to IR for procedure   500 mg Irrigation To Radiology 10/07/23 1418 10/10/23 1015   10/10/23 0000  doxycycline  (VIBRAMYCIN ) 500 mg in dextrose  5 % 250 mL IVPB  Status:  Discontinued        500 mg 125 mL/hr over 120 Minutes Intravenous To Radiology 10/07/23 1402 10/07/23 1409   10/10/23 0000  doxycycline  200mg  in 20 mL SWI irrigation / sclerosing solution for IR  Status:  Discontinued        200 mg Irrigation To Radiology 10/07/23 1418 10/07/23 1456   10/10/23 0000  doxycycline  200mg  in 20 mL SWI irrigation / sclerosing solution for IR  Status:  Discontinued        200 mg Irrigation To Radiology 10/07/23 1440 10/07/23 1456   10/02/23 2200  linezolid  (ZYVOX ) tablet 600 mg        600 mg Oral Every 12 hours 10/01/23 0852 10/09/23 2108   10/02/23 1000  linezolid  (ZYVOX ) tablet 600 mg  Status:  Discontinued        600 mg Oral Every 12 hours 10/01/23 0723 10/01/23 0852   09/30/23 1300  vancomycin  variable dose per unstable renal function (pharmacist dosing)  Status:  Discontinued         Does not apply See admin instructions 09/30/23 1301 10/01/23 0725   09/29/23 1145  amoxicillin -clavulanate (AUGMENTIN ) 875-125  MG per tablet 1 tablet        1 tablet Oral Every 12 hours 09/29/23 1047 10/09/23 2108   09/27/23 0200  vancomycin  (VANCOREADY) IVPB 1500 mg/300 mL        1,500 mg 150 mL/hr  over 120 Minutes Intravenous Every 12 hours 09/26/23 1402 09/30/23 1448   09/26/23 1500  cefTRIAXone  (ROCEPHIN ) 2 g in sodium chloride  0.9 % 100 mL IVPB  Status:  Discontinued        2 g 200 mL/hr over 30 Minutes Intravenous Every 24 hours 09/26/23 1402 09/29/23 1047   09/26/23 1315  vancomycin  (VANCOREADY) IVPB 2000 mg/400 mL        2,000 mg 200 mL/hr over 120 Minutes Intravenous  Once 09/26/23 1220 09/26/23 1656   09/26/23 1230  metroNIDAZOLE  (FLAGYL ) IVPB 500 mg  Status:  Discontinued        500 mg 100 mL/hr over 60 Minutes Intravenous Every 12 hours 09/26/23 1137 09/29/23 1138   09/22/23 2200  voriconazole  (VFEND ) tablet 400 mg  Status:  Discontinued        400 mg Oral Every 12 hours 09/22/23 1521 10/17/23 1457   09/11/23 2200  acyclovir  (ZOVIRAX ) 200 MG capsule 400 mg       Note to Pharmacy: Give after dialysis the pm dose, on dialysis days   400 mg Oral 2 times daily 09/11/23 1551     09/11/23 2200  voriconazole  (VFEND ) tablet 350 mg  Status:  Discontinued        350 mg Oral Every 12 hours 09/11/23 1551 09/22/23 1521   09/07/23 1000  acyclovir  (ZOVIRAX ) 200 MG capsule 400 mg  Status:  Discontinued       Note to Pharmacy: Give after dialysis the pm dose, on dialysis days   400 mg Per Tube 2 times daily 09/07/23 0825 09/11/23 1551   09/03/23 1200  vancomycin  (VANCOCIN ) IVPB 1000 mg/200 mL premix  Status:  Discontinued        1,000 mg 200 mL/hr over 60 Minutes Intravenous Every M-W-F (Hemodialysis) 09/02/23 1021 09/03/23 0942   09/03/23 0942  vancomycin  variable dose per unstable renal function (pharmacist dosing)  Status:  Discontinued         Does not apply See admin instructions 09/03/23 0942 09/03/23 0945   09/02/23 0915  vancomycin  (VANCOCIN ) IVPB 1000 mg/200 mL premix        1,000 mg 200 mL/hr over 60 Minutes Intravenous  Once 09/02/23 0830 09/02/23 1215   09/01/23 0834  ceFAZolin  (ANCEF ) IVPB 2g/100 mL premix        over 30 Minutes Intravenous Continuous PRN 09/01/23 0834  09/01/23 0834   08/29/23 0914  vancomycin  variable dose per unstable renal function (pharmacist dosing)  Status:  Discontinued         Does not apply See admin instructions 08/29/23 0914 09/02/23 1021   08/27/23 1400  vancomycin  (VANCOCIN ) IVPB 1000 mg/200 mL premix        1,000 mg 200 mL/hr over 60 Minutes Intravenous  Once 08/27/23 0920 08/27/23 1653   08/25/23 1200  vancomycin  (VANCOCIN ) IVPB 1000 mg/200 mL premix        1,000 mg 200 mL/hr over 60 Minutes Intravenous Every M-W-F (Hemodialysis) 08/24/23 1405 08/29/23 1240   08/24/23 1445  vancomycin  (VANCOREADY) IVPB 2000 mg/400 mL        2,000 mg 200 mL/hr over 120 Minutes Intravenous  Once 08/24/23 1353 08/25/23 0323   08/14/23 0600  ceFAZolin  (ANCEF ) IVPB 2g/100 mL premix  2 g 200 mL/hr over 30 Minutes Intravenous To Radiology 08/13/23 1055 08/14/23 0606   08/09/23 1000  acyclovir  (ZOVIRAX ) 200 MG capsule 200 mg  Status:  Discontinued       Note to Pharmacy: Give after dialysis the pm dose, on dialysis days   200 mg Per Tube 2 times daily 08/08/23 2358 09/07/23 0825   08/09/23 0100  acyclovir  (ZOVIRAX ) 200 MG capsule 200 mg       Note to Pharmacy: Give after dialysis the pm dose, on dialysis days   200 mg Per Tube  Once 08/09/23 0009 08/09/23 0013   08/08/23 2345  acyclovir  (ZOVIRAX ) 200 MG capsule 200 mg  Status:  Discontinued       Note to Pharmacy: Give after dialysis the pm dose, on dialysis days   200 mg Oral 2 times daily 08/08/23 2257 08/08/23 2358   08/07/23 1530  ceFAZolin  (ANCEF ) IVPB 2g/100 mL premix        over 30 Minutes Intravenous Continuous PRN 08/07/23 1555 08/07/23 1530   08/05/23 2200  voriconazole  (VFEND ) tablet 350 mg  Status:  Discontinued        350 mg Per Tube Every 12 hours 08/05/23 1511 09/11/23 1551   08/01/23 1200  vancomycin  (VANCOCIN ) IVPB 1000 mg/200 mL premix        1,000 mg 200 mL/hr over 60 Minutes Intravenous Every M-W-F (Hemodialysis) 07/31/23 0649 08/01/23 1652   07/30/23 1200  vancomycin   (VANCOCIN ) IVPB 1000 mg/200 mL premix        1,000 mg 200 mL/hr over 60 Minutes Intravenous Every M-W-F (Hemodialysis) 07/30/23 0851 07/30/23 1241   07/29/23 0900  vancomycin  (VANCOCIN ) IVPB 1000 mg/200 mL premix        1,000 mg 200 mL/hr over 60 Minutes Intravenous  Once 07/29/23 0812 07/29/23 0943   07/28/23 1204  vancomycin  variable dose per unstable renal function (pharmacist dosing)  Status:  Discontinued         Does not apply See admin instructions 07/28/23 1204 07/31/23 0649   07/28/23 1000  voriconazole  (VFEND ) 350 mg in sodium chloride  0.9 % 100 mL IVPB  Status:  Discontinued       Placed in Followed by Linked Group   4 mg/kg  88.6 kg (Adjusted) 67.5 mL/hr over 120 Minutes Intravenous Every 12 hours 07/28/23 0748 08/05/23 1511   07/27/23 1000  voriconazole  (VFEND ) 400 mg in sodium chloride  0.9 % 100 mL IVPB  Status:  Discontinued       Placed in Followed by Linked Group   4 mg/kg  100.6 kg 70 mL/hr over 120 Minutes Intravenous Every 12 hours 07/26/23 0826 07/28/23 0748   07/27/23 0945  vancomycin  (VANCOREADY) IVPB 2000 mg/400 mL        2,000 mg 200 mL/hr over 120 Minutes Intravenous  Once 07/27/23 0849 07/27/23 1237   07/26/23 1515  Ampicillin -Sulbactam (UNASYN ) 3 g in sodium chloride  0.9 % 100 mL IVPB  Status:  Discontinued        3 g 200 mL/hr over 30 Minutes Intravenous Every 8 hours 07/26/23 1418 07/27/23 0830   07/26/23 0915  voriconazole  (VFEND ) 600 mg in sodium chloride  0.9 % 150 mL IVPB       Placed in Followed by Linked Group   6 mg/kg  100.6 kg 105 mL/hr over 120 Minutes Intravenous Every 12 hours 07/26/23 0826 07/27/23 0048   07/26/23 0900  Ampicillin -Sulbactam (UNASYN ) 3 g in sodium chloride  0.9 % 100 mL IVPB  Status:  Discontinued  3 g 200 mL/hr over 30 Minutes Intravenous Every 12 hours 07/26/23 0826 07/26/23 1418   07/23/23 1000  levofloxacin  (LEVAQUIN ) IVPB 500 mg  Status:  Discontinued       Placed in Followed by Linked Group   500 mg 100  mL/hr over 60 Minutes Intravenous Every 24 hours 07/22/23 1213 07/23/23 1001   07/23/23 1000  levofloxacin  (LEVAQUIN ) IVPB 500 mg  Status:  Discontinued       Placed in Followed by Linked Group   500 mg 100 mL/hr over 60 Minutes Intravenous Every 24 hours 07/23/23 1001 07/28/23 0804   07/22/23 1300  levofloxacin  (LEVAQUIN ) IVPB 750 mg       Placed in Followed by Linked Group   750 mg 100 mL/hr over 90 Minutes Intravenous  Once 07/22/23 1213 07/22/23 1542   07/21/23 1600  voriconazole  (VFEND ) 300 mg in sodium chloride  0.9 % 100 mL IVPB  Status:  Discontinued        300 mg 65 mL/hr over 120 Minutes Intravenous Every 12 hours 07/21/23 1430 07/22/23 1415   07/21/23 1000  voriconazole  (VFEND ) tablet 200 mg  Status:  Discontinued        200 mg Oral Every 12 hours 07/20/23 0944 07/21/23 1015   07/20/23 1030  voriconazole  (VFEND ) 530 mg in sodium chloride  0.9 % 150 mL IVPB        6 mg/kg  88.6 kg (Adjusted) 101.5 mL/hr over 120 Minutes Intravenous Every 12 hours 07/20/23 0944 07/21/23 0030   07/19/23 1400  meropenem  (MERREM ) 1 g in sodium chloride  0.9 % 100 mL IVPB  Status:  Discontinued        1 g 200 mL/hr over 30 Minutes Intravenous Every 8 hours 07/19/23 1143 07/22/23 1213   07/18/23 0600  meropenem  (MERREM ) 1 g in sodium chloride  0.9 % 100 mL IVPB  Status:  Discontinued        1 g 200 mL/hr over 30 Minutes Intravenous Every 24 hours 07/17/23 0916 07/19/23 1143   07/17/23 0700  meropenem  (MERREM ) 1 g in sodium chloride  0.9 % 100 mL IVPB  Status:  Discontinued        1 g 200 mL/hr over 30 Minutes Intravenous Every 12 hours 07/17/23 0601 07/17/23 0916   07/15/23 1300  vancomycin  (VANCOCIN ) IVPB 1000 mg/200 mL premix  Status:  Discontinued        1,000 mg 200 mL/hr over 60 Minutes Intravenous Every 24 hours 07/14/23 1129 07/22/23 1319   07/14/23 1215  vancomycin  (VANCOREADY) IVPB 1750 mg/350 mL        1,750 mg 175 mL/hr over 120 Minutes Intravenous  Once 07/14/23 1129 07/14/23 1449    07/09/23 1800  piperacillin -tazobactam (ZOSYN ) IVPB 3.375 g  Status:  Discontinued        3.375 g 12.5 mL/hr over 240 Minutes Intravenous Every 12 hours 07/09/23 0756 07/09/23 0844   07/09/23 1400  piperacillin -tazobactam (ZOSYN ) IVPB 3.375 g  Status:  Discontinued        3.375 g 12.5 mL/hr over 240 Minutes Intravenous Every 8 hours 07/09/23 0844 07/17/23 0557   07/08/23 1300  piperacillin -tazobactam (ZOSYN ) IVPB 3.375 g  Status:  Discontinued        3.375 g 12.5 mL/hr over 240 Minutes Intravenous Every 8 hours 07/08/23 1129 07/09/23 0756   07/05/23 2200  azithromycin  (ZITHROMAX ) 500 mg in sodium chloride  0.9 % 250 mL IVPB        500 mg 250 mL/hr over 60 Minutes Intravenous Daily at bedtime  07/05/23 2022 07/06/23 2302   07/05/23 2115  azithromycin  (ZITHROMAX ) 250 mg in dextrose  5 % 125 mL IVPB  Status:  Discontinued       Note to Pharmacy: Missed dose this morning   250 mg 127.5 mL/hr over 60 Minutes Intravenous Every 24 hours 07/05/23 2016 07/05/23 2021   07/04/23 0900  fluconazole  (DIFLUCAN ) IVPB 200 mg  Status:  Discontinued        200 mg 100 mL/hr over 60 Minutes Intravenous Every 48 hours 07/04/23 0722 07/09/23 0809   07/03/23 0600  cefTRIAXone  (ROCEPHIN ) 1 g in sodium chloride  0.9 % 100 mL IVPB  Status:  Discontinued        1 g 200 mL/hr over 30 Minutes Intravenous Every 24 hours 07/03/23 0508 07/08/23 1104   07/03/23 0600  azithromycin  (ZITHROMAX ) tablet 250 mg  Status:  Discontinued        250 mg Oral Daily 07/03/23 0508 07/05/23 2016          Medications  (feeding supplement) PROSource Plus  30 mL Oral TID BM   acyclovir   400 mg Oral BID   apixaban   5 mg Oral BID   collagenase    Topical Daily   docusate sodium   100 mg Oral BID   [START ON 10/23/2023] doxycycline  200mg  in 20 mL SWI irrigation / sclerosing solution for IR  200 mg Irrigation to XRAY   [START ON 10/23/2023] doxycycline  200mg  in 20 mL SWI irrigation / sclerosing solution for IR  200 mg Irrigation to XRAY    epoetin  alfa  40,000 Units Subcutaneous Q Wed-1800   feeding supplement  237 mL Oral BID WC   guaiFENesin   600 mg Oral BID   heparin  lock flush  500 Units Intravenous Once   hydrALAZINE   25 mg Oral Q8H   insulin  aspart  0-15 Units Subcutaneous TID AC & HS   insulin  aspart  2 Units Subcutaneous TID AC & HS   insulin  glargine-yfgn  13 Units Subcutaneous Q24H   lidocaine   1 patch Transdermal Q24H   montelukast   5 mg Oral QHS   multivitamin with minerals  1 tablet Oral Daily   nutrition supplement (JUVEN)  1 packet Oral BID AC   mouth rinse  15 mL Mouth Rinse 4 times per day   pantoprazole   40 mg Oral BID   PARoxetine   40 mg Oral Daily   polyethylene glycol  17 g Oral BID   senna  1 tablet Oral Daily   umeclidinium-vilanterol  1 puff Inhalation Daily   valproic  acid  750 mg Oral BID   verapamil   120 mg Oral Daily   voriconazole   400 mg Oral QHS   voriconazole   450 mg Oral Daily      Subjective:  , Frank [name] was seen and examined today.  No acute complaints, pain controlled.  Eating breakfast at the time of my exam.    Objective:   Vitals:   10/20/23 0500 10/20/23 0804 10/20/23 0856 10/20/23 1000  BP: 117/86 (!) 150/101  124/86  Pulse: 95 100  (!) 103  Resp: 16     Temp: 97.7 F (36.5 C) 97.8 F (36.6 C)    TempSrc: Oral Oral    SpO2: 95%  95%   Weight:      Height:        Intake/Output Summary (Last 24 hours) at 10/20/2023 1114 Last data filed at 10/20/2023 1020 Gross per 24 hour  Intake 473.5 ml  Output 4590 ml  Net -4116.5  ml     Wt Readings from Last 3 Encounters:  10/18/23 102 kg  06/05/23 113.7 kg  01/29/23 117.9 kg    Physical Exam General: Alert and oriented x 3, NAD Cardiovascular: S1 S2 clear, RRR.  Respiratory: CTAB Gastrointestinal: Soft, nontender, nondistended, NBS Ext: no pedal edema bilaterally, LUE above elbow amputation, drain right hip Neuro: no new deficits Psych: Normal affect       Data Reviewed:  I have personally reviewed  following labs    CBC Lab Results  Component Value Date   WBC 5.3 10/20/2023   RBC 2.99 (L) 10/20/2023   HGB 9.5 (L) 10/20/2023   HCT 30.3 (L) 10/20/2023   MCV 101.3 (H) 10/20/2023   MCH 31.8 10/20/2023   PLT 326 10/20/2023   MCHC 31.4 10/20/2023   RDW 18.7 (H) 10/20/2023   LYMPHSABS 1.2 10/06/2023   MONOABS 0.9 10/06/2023   EOSABS 0.1 10/06/2023   BASOSABS 0.0 10/06/2023     Last metabolic panel Lab Results  Component Value Date   NA 133 (L) 10/20/2023   K 4.3 10/20/2023   CL 99 10/20/2023   CO2 25 10/20/2023   BUN 42 (H) 10/20/2023   CREATININE 1.32 (H) 10/20/2023   GLUCOSE 109 (H) 10/20/2023   GFRNONAA >60 10/20/2023   GFRAA >60 12/27/2017   CALCIUM  9.1 10/20/2023   PHOS 4.3 10/17/2023   PROT 5.8 (L) 10/20/2023   ALBUMIN  2.3 (L) 10/20/2023   LABGLOB 2.6 09/12/2023   AGRATIO 0.9 09/12/2023   BILITOT 0.5 10/20/2023   ALKPHOS 57 10/20/2023   AST 8 (L) 10/20/2023   ALT 7 10/20/2023   ANIONGAP 9 10/20/2023    CBG (last 3)  Recent Labs    10/19/23 1634 10/19/23 2205 10/20/23 0806  GLUCAP 150* 116* 93      Coagulation Profile: No results for input(s): INR, PROTIME in the last 168 hours.   Radiology Studies: I have personally reviewed the imaging studies  No results found.      Nydia Distance M.D. [facility] 10/20/2023, 11:14 AM  Available via Epic secure chat 7am-7pm After 7 pm, please refer to night coverage provider listed on amion.

## 2023-10-20 NOTE — Progress Notes (Signed)
 Occupational Therapy Treatment Patient Details Name: Frank [name]. MRN: [id number] DOB: [birth year] Today's Date: 10/20/2023   History of present illness 58 y.o. male transferred from [facility] 07/03/23 to [facility] for management of newly diagnosed plasma cell myeloma with weakness and AMS. Pt also with AKI and metabolic encephalopathy. 5/10 transfer to [facility] for iHD. 4/22 Intubated and chemo initiated. 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 5/6 IVIG started. 5/8-6/11 trach. 5/12 iHD initiated. 5/29 PEG placed. 6/5 chemo initiated. PMHx:Lt THA, carpal tunnel syndrome, Lt TKA, Lt humerus fx, PAF, HTN, HLD, bipolar disorder, obesity, T2DM   OT comments  STAR Patient OT/PT session: Patient received in supine and asking to use BSC. Patient instructed on bed mobility and mod assist to get to EOB due to assistance with raising trunk. Patient stood from EOB into [place] with max assist +2 and transferred to [facility]. Patient was total assist for toilet hygiene seated on BSC. Standing attempted from [facility] into stedy with patient having difficulty assisting and required total assist +2 to stand and to go from sit to supine. Patient to continue to be followed by acute OT with STAR program to address established goals.       If plan is discharge home, recommend the following:  Two people to help with walking and/or transfers;Assistance with cooking/housework;Assistance with feeding;Direct supervision/assist for medications management;Direct supervision/assist for financial management;Assist for transportation;Help with stairs or ramp for entrance;A lot of help with bathing/dressing/bathroom   Equipment Recommendations  Other (comment) (TBD)    Recommendations for Other Services      Precautions / Restrictions Precautions Precautions: Fall Recall of Precautions/Restrictions: Impaired Restrictions Weight Bearing Restrictions Per Provider Order: Yes LUE Weight Bearing Per Provider Order: Non weight bearing Other  Position/Activity Restrictions: ROM to tolerance       Mobility Bed Mobility Overal bed mobility: Needs Assistance Bed Mobility: Supine to Sit, Sit to Supine     Supine to sit: Mod assist, HOB elevated, Used rails Sit to supine: Total assist, +2 for physical assistance   General bed mobility comments: assistance with trunk to get to EOB and total assist for trunk and BLEs    Transfers Overall transfer level: Needs assistance Equipment used: Ambulation equipment used Transfers: Sit to/from Stand, Bed to chair/wheelchair/BSC Sit to Stand: +2 physical assistance, Max assist, +2 safety/equipment, Total assist           General transfer comment: Stedy used for transfer to [facility] with max assist +2 to stand from EOB and stedy pads and total assist +2 from [facility] Transfer via Lift Equipment: Stedy   Balance Overall balance assessment: Needs assistance Sitting-balance support: Feet supported, No upper extremity supported Sitting balance-Leahy Scale: Fair Sitting balance - Comments: EOB   Standing balance support: Single extremity supported, During functional activity, Reliant on assistive device for balance Standing balance-Leahy Scale: Zero Standing balance comment: reliant on Stedy for support                           ADL either performed or assessed with clinical judgement   ADL Overall ADL's : Needs assistance/impaired     Grooming: Wash/dry face;Set up;Sitting           Upper Body Dressing : Sitting;Moderate assistance       Toilet Transfer: Maximal assistance;+2 for physical assistance;BSC/3in1 Laurent) Toilet Transfer Details (indicate cue type and reason): transfer to [facility] with Stedy with max assist +2 to stand from EOB and total assist +2  to stand from [facility] Toileting- Clothing Manipulation and Hygiene: Total assistance;Sitting/lateral lean Toileting - Clothing Manipulation Details (indicate cue type and reason): total assist for toilet hygiene while seated  on BSC       General ADL Comments: focused on [facility] transfers    Extremity/Trunk Assessment              Vision       Perception     Praxis     Communication Communication Communication: Impaired Factors Affecting Communication: Reduced clarity of speech   Cognition Arousal: Alert Behavior During Therapy: WFL for tasks assessed/performed Cognition: Cognition impaired           Executive functioning impairment (select all impairments): Problem solving                   Following commands: Impaired Following commands impaired: Follows one step commands with increased time      Cueing   Cueing Techniques: Verbal cues, Tactile cues, Visual cues  Exercises      Shoulder Instructions       General Comments VSS on RA    Pertinent Vitals/ Pain       Pain Assessment Pain Assessment: Faces Faces Pain Scale: Hurts even more Pain Location: LUE and right knee Pain Descriptors / Indicators: Discomfort, Aching, Guarding Pain Intervention(s): Limited activity within patient's tolerance, Monitored during session, Repositioned  Home Living                                          Prior Functioning/Environment              Frequency  Min 1X/week (STAR Program)        Progress Toward Goals  OT Goals(current goals can now be found in the care plan section)  Progress towards OT goals: Progressing toward goals  Acute Rehab OT Goals Patient Stated Goal: to go home OT Goal Formulation: With patient Time For Goal Achievement: 10/29/23 Potential to Achieve Goals: Fair ADL Goals Pt Will Perform Grooming: sitting;with set-up (A/E PRN) Pt Will Perform Upper Body Dressing: sitting;with contact guard assist;with adaptive equipment Pt Will Perform Lower Body Dressing: sitting/lateral leans;sit to/from stand;with mod assist (Using one handed RUE techniques) Pt Will Transfer to Toilet: with contact guard assist;bedside commode;with  transfer board Pt/caregiver will Perform Home Exercise Program: Increased ROM;Left upper extremity;With written HEP provided;Independently Additional ADL Goal #1: Pt will complete bed mobility with min A as a precursor to EOB ADLs Additional ADL Goal #2: Pt will demonstrate ability to stand statically for >3 mins to demonstrate improving tolerance in prep for OOB mobility Additional ADL Goal #3: Pt will demonstrate independence with sensory stimulation to LUE to improve phantom symptoms.  Plan      Co-evaluation    PT/OT/SLP Co-Evaluation/Treatment: Yes Reason for Co-Treatment: For patient/therapist safety;To address functional/ADL transfers PT goals addressed during session: Mobility/safety with mobility;Balance OT goals addressed during session: ADL's and self-care;Strengthening/ROM      AM-PAC OT 6 Clicks Daily Activity     Outcome Measure   Help from another person eating meals?: A Little Help from another person taking care of personal grooming?: A Little Help from another person toileting, which includes using toliet, bedpan, or urinal?: Total Help from another person bathing (including washing, rinsing, drying)?: A Lot Help from another person to put on and taking off regular upper body clothing?: A Lot  Help from another person to put on and taking off regular lower body clothing?: A Lot 6 Click Score: 13    End of Session Equipment Utilized During Treatment: Gait belt;Other (comment) Laurent)  OT Visit Diagnosis: Unsteadiness on feet (R26.81);Other abnormalities of gait and mobility (R26.89);Muscle weakness (generalized) (M62.81);Pain Pain - Right/Left: Left Pain - part of body: Shoulder   Activity Tolerance Patient tolerated treatment well   Patient Left in bed;with call bell/phone within reach   Nurse Communication Mobility status        Time: 8666-8567 OT Time Calculation (min): 59 min  Charges: OT General Charges $OT Visit: 1 Visit OT Treatments $Self  Care/Home Management : 23-37 mins  [name], OTA Acute Rehabilitation Services  Office [phone]   [name] 10/20/2023, 3:19 PM

## 2023-10-20 NOTE — Plan of Care (Signed)
 Problem: Education: Goal: Knowledge of General Education information will improve Description: Including pain rating scale, medication(s)/side effects and non-pharmacologic comfort measures 10/20/2023 0428 by Jackquline Leonette CROME, RN Outcome: Progressing 10/20/2023 0428 by Jackquline Leonette CROME, RN Outcome: Progressing   Problem: Health Behavior/Discharge Planning: Goal: Ability to manage health-related needs will improve 10/20/2023 0428 by Jackquline Leonette CROME, RN Outcome: Progressing 10/20/2023 0428 by Jackquline Leonette CROME, RN Outcome: Progressing   Problem: Clinical Measurements: Goal: Ability to maintain clinical measurements within normal limits will improve 10/20/2023 0428 by Jackquline Leonette CROME, RN Outcome: Progressing 10/20/2023 0428 by Jackquline Leonette CROME, RN Outcome: Progressing Goal: Will remain free from infection 10/20/2023 0428 by Jackquline Leonette CROME, RN Outcome: Progressing 10/20/2023 0428 by Jackquline Leonette CROME, RN Outcome: Progressing Goal: Diagnostic test results will improve 10/20/2023 0428 by Jackquline Leonette CROME, RN Outcome: Progressing 10/20/2023 0428 by Jackquline Leonette CROME, RN Outcome: Progressing Goal: Respiratory complications will improve 10/20/2023 0428 by Jackquline Leonette CROME, RN Outcome: Progressing 10/20/2023 0428 by Jackquline Leonette CROME, RN Outcome: Progressing Goal: Cardiovascular complication will be avoided 10/20/2023 0428 by Jackquline Leonette CROME, RN Outcome: Progressing 10/20/2023 0428 by Jackquline Leonette CROME, RN Outcome: Progressing   Problem: Activity: Goal: Risk for activity intolerance will decrease 10/20/2023 0428 by Jackquline Leonette CROME, RN Outcome: Progressing 10/20/2023 0428 by Jackquline Leonette CROME, RN Outcome: Progressing   Problem: Nutrition: Goal: Adequate nutrition will be maintained 10/20/2023 0428 by Jackquline Leonette CROME, RN Outcome: Progressing 10/20/2023 0428 by Jackquline Leonette CROME, RN Outcome: Progressing   Problem: Coping: Goal: Level of anxiety will decrease 10/20/2023 0428 by Jackquline Leonette CROME, RN Outcome: Progressing 10/20/2023 0428 by Jackquline Leonette CROME, RN Outcome: Progressing   Problem: Elimination: Goal: Will not experience complications related to bowel motility 10/20/2023 0428 by Jackquline Leonette CROME, RN Outcome: Progressing 10/20/2023 0428 by Jackquline Leonette CROME, RN Outcome: Progressing Goal: Will not experience complications related to urinary retention 10/20/2023 0428 by Jackquline Leonette CROME, RN Outcome: Progressing 10/20/2023 0428 by Jackquline Leonette CROME, RN Outcome: Progressing   Problem: Pain Managment: Goal: General experience of comfort will improve and/or be controlled 10/20/2023 0428 by Jackquline Leonette CROME, RN Outcome: Progressing 10/20/2023 0428 by Jackquline Leonette CROME, RN Outcome: Progressing   Problem: Safety: Goal: Ability to remain free from injury will improve 10/20/2023 0428 by Jackquline Leonette CROME, RN Outcome: Progressing 10/20/2023 0428 by Jackquline Leonette CROME, RN Outcome: Progressing   Problem: Skin Integrity: Goal: Risk for impaired skin integrity will decrease 10/20/2023 0428 by Jackquline Leonette CROME, RN Outcome: Progressing 10/20/2023 0428 by Jackquline Leonette CROME, RN Outcome: Progressing   Problem: Education: Goal: Ability to describe self-care measures that may prevent or decrease complications (Diabetes Survival Skills Education) will improve 10/20/2023 0428 by Jackquline Leonette CROME, RN Outcome: Progressing 10/20/2023 0428 by Jackquline Leonette CROME, RN Outcome: Progressing Goal: Individualized Educational Video(s) 10/20/2023 0428 by Jackquline Leonette CROME, RN Outcome: Progressing 10/20/2023 0428 by Jackquline Leonette CROME, RN Outcome: Progressing   Problem: Coping: Goal: Ability to adjust to condition or change in health will improve 10/20/2023 0428 by Jackquline Leonette CROME, RN Outcome: Progressing 10/20/2023 0428 by Jackquline Leonette CROME, RN Outcome: Progressing   Problem: Fluid Volume: Goal: Ability to maintain a balanced intake and output will improve 10/20/2023 0428 by Jackquline Leonette CROME,  RN Outcome: Progressing 10/20/2023 0428 by Jackquline Leonette CROME, RN Outcome: Progressing   Problem: Health Behavior/Discharge Planning: Goal: Ability to identify and utilize available resources and services will improve 10/20/2023 0428 by Jackquline Leonette CROME, RN Outcome: Progressing 10/20/2023 0428 by  Jackquline Leonette CROME, RN Outcome: Progressing Goal: Ability to manage health-related needs will improve 10/20/2023 0428 by Jackquline Leonette CROME, RN Outcome: Progressing 10/20/2023 0428 by Jackquline Leonette CROME, RN Outcome: Progressing   Problem: Metabolic: Goal: Ability to maintain appropriate glucose levels will improve 10/20/2023 0428 by Jackquline Leonette CROME, RN Outcome: Progressing 10/20/2023 0428 by Jackquline Leonette CROME, RN Outcome: Progressing   Problem: Nutritional: Goal: Maintenance of adequate nutrition will improve 10/20/2023 0428 by Jackquline Leonette CROME, RN Outcome: Progressing 10/20/2023 0428 by Jackquline Leonette CROME, RN Outcome: Progressing Goal: Progress toward achieving an optimal weight will improve 10/20/2023 0428 by Jackquline Leonette CROME, RN Outcome: Progressing 10/20/2023 0428 by Jackquline Leonette CROME, RN Outcome: Progressing   Problem: Skin Integrity: Goal: Risk for impaired skin integrity will decrease 10/20/2023 0428 by Jackquline Leonette CROME, RN Outcome: Progressing 10/20/2023 0428 by Jackquline Leonette CROME, RN Outcome: Progressing   Problem: Tissue Perfusion: Goal: Adequacy of tissue perfusion will improve 10/20/2023 0428 by Jackquline Leonette CROME, RN Outcome: Progressing 10/20/2023 0428 by Jackquline Leonette CROME, RN Outcome: Progressing   Problem: Education: Goal: Knowledge about tracheostomy care/management will improve 10/20/2023 0428 by Jackquline Leonette CROME, RN Outcome: Progressing 10/20/2023 0428 by Jackquline Leonette CROME, RN Outcome: Progressing   Problem: Activity: Goal: Ability to tolerate increased activity will improve 10/20/2023 0428 by Jackquline Leonette CROME, RN Outcome: Progressing 10/20/2023 0428 by Jackquline Leonette CROME,  RN Outcome: Progressing   Problem: Health Behavior/Discharge Planning: Goal: Ability to manage tracheostomy will improve 10/20/2023 0428 by Jackquline Leonette CROME, RN Outcome: Progressing 10/20/2023 0428 by Jackquline Leonette CROME, RN Outcome: Progressing   Problem: Education: Goal: Knowledge of General Education information will improve Description: Including pain rating scale, medication(s)/side effects and non-pharmacologic comfort measures 10/20/2023 0428 by Jackquline Leonette CROME, RN Outcome: Progressing 10/20/2023 0428 by Jackquline Leonette CROME, RN Outcome: Progressing   Problem: Health Behavior/Discharge Planning: Goal: Ability to manage health-related needs will improve 10/20/2023 0428 by Jackquline Leonette CROME, RN Outcome: Progressing 10/20/2023 0428 by Jackquline Leonette CROME, RN Outcome: Progressing   Problem: Clinical Measurements: Goal: Ability to maintain clinical measurements within normal limits will improve 10/20/2023 0428 by Jackquline Leonette CROME, RN Outcome: Progressing 10/20/2023 0428 by Jackquline Leonette CROME, RN Outcome: Progressing Goal: Will remain free from infection 10/20/2023 0428 by Jackquline Leonette CROME, RN Outcome: Progressing 10/20/2023 0428 by Jackquline Leonette CROME, RN Outcome: Progressing Goal: Diagnostic test results will improve 10/20/2023 0428 by Jackquline Leonette CROME, RN Outcome: Progressing 10/20/2023 0428 by Jackquline Leonette CROME, RN Outcome: Progressing Goal: Respiratory complications will improve 10/20/2023 0428 by Jackquline Leonette CROME, RN Outcome: Progressing 10/20/2023 0428 by Jackquline Leonette CROME, RN Outcome: Progressing Goal: Cardiovascular complication will be avoided 10/20/2023 0428 by Jackquline Leonette CROME, RN Outcome: Progressing 10/20/2023 0428 by Jackquline Leonette CROME, RN Outcome: Progressing   Problem: Activity: Goal: Risk for activity intolerance will decrease 10/20/2023 0428 by Jackquline Leonette CROME, RN Outcome: Progressing 10/20/2023 0428 by Jackquline Leonette CROME, RN Outcome: Progressing   Problem: Nutrition: Goal:  Adequate nutrition will be maintained 10/20/2023 0428 by Jackquline Leonette CROME, RN Outcome: Progressing 10/20/2023 0428 by Jackquline Leonette CROME, RN Outcome: Progressing   Problem: Coping: Goal: Level of anxiety will decrease Outcome: Progressing   Problem: Elimination: Goal: Will not experience complications related to bowel motility Outcome: Progressing Goal: Will not experience complications related to urinary retention Outcome: Progressing   Problem: Pain Managment: Goal: General experience of comfort will improve and/or be controlled Outcome: Progressing   Problem: Safety: Goal: Ability to remain free from injury will improve Outcome:  Progressing   Problem: Skin Integrity: Goal: Risk for impaired skin integrity will decrease Outcome: Progressing

## 2023-10-20 NOTE — Progress Notes (Signed)
 Over the weekend, [name] really had no problems.  He is sitting in the chair.  He is doing some physical therapy.  The left arm is improving with pain.  I know that PT and OT are working hard with him to get him more independent.  I think that we can finally get going with treatment tomorrow.  The orders are all written.  He has not had chemotherapy now for I think a month or so.  The drain in the right hip hopefully will come out midweek.  His labs show sodium 133.  Potassium 4.3.  BUN 42 creatinine 1.32.  Albumin  2.3.  His CBC shows white cell count of 5.3.  Hemoglobin 9.5.  Platelet count 326,000.  He has had no fever.  There is been no bleeding.  He is on anticoagulation.  Hopefully, we are at a point where he can be able to go to rehab or SNF soon.  Vital signs show temperature of 97.7.  Pulse 95.  Blood pressure 117/86.  His lungs sound clear bilaterally.  Oral exam shows no mucositis.  There is no thrush.  Cardiac exam regular rate and rhythm.  There are no murmurs.  Abdomen is obese but soft.  He has decent bowel sounds.  There is no fluid wave.  There is no palpable liver or spleen tip.  Extremities shows the drain in the right hip area.  He has minimal edema in the lower legs.  He has good strength in his right arm and legs.  Neurological exam is nonfocal.   Again, [name] is making improvement.  Again we should be able to get to the point where he can be sent to either rehab or SNF.  We will do chemotherapy tomorrow.  I do appreciate all the great care he is getting from everybody up on  5C.   Jeralyn Crease, MD  Acts 16:31

## 2023-10-21 DIAGNOSIS — I4891 Unspecified atrial fibrillation: Secondary | ICD-10-CM | POA: Diagnosis not present

## 2023-10-21 DIAGNOSIS — R6521 Severe sepsis with septic shock: Secondary | ICD-10-CM | POA: Diagnosis not present

## 2023-10-21 DIAGNOSIS — I5032 Chronic diastolic (congestive) heart failure: Secondary | ICD-10-CM | POA: Diagnosis not present

## 2023-10-21 DIAGNOSIS — A419 Sepsis, unspecified organism: Secondary | ICD-10-CM | POA: Diagnosis not present

## 2023-10-21 DIAGNOSIS — R531 Weakness: Secondary | ICD-10-CM | POA: Diagnosis not present

## 2023-10-21 LAB — COMPREHENSIVE METABOLIC PANEL WITH GFR
ALT: 6 U/L (ref 0–44)
AST: 9 U/L — ABNORMAL LOW (ref 15–41)
Albumin: 2.2 g/dL — ABNORMAL LOW (ref 3.5–5.0)
Alkaline Phosphatase: 62 U/L (ref 38–126)
Anion gap: 9 (ref 5–15)
BUN: 45 mg/dL — ABNORMAL HIGH (ref 6–20)
CO2: 26 mmol/L (ref 22–32)
Calcium: 8.8 mg/dL — ABNORMAL LOW (ref 8.9–10.3)
Chloride: 99 mmol/L (ref 98–111)
Creatinine, Ser: 1.26 mg/dL — ABNORMAL HIGH (ref 0.61–1.24)
GFR, Estimated: >60 mL/min (ref >60)
Glucose, Bld: 136 mg/dL — ABNORMAL HIGH (ref 70–99)
Potassium: 4.3 mmol/L (ref 3.5–5.1)
Sodium: 134 mmol/L — ABNORMAL LOW (ref 135–145)
Total Bilirubin: 0.5 mg/dL (ref 0.0–1.2)
Total Protein: 5.4 g/dL — ABNORMAL LOW (ref 6.5–8.1)

## 2023-10-21 LAB — CBC WITH DIFFERENTIAL/PLATELET
Abs Immature Granulocytes: 0.03 K/uL (ref 0.00–0.07)
Basophils Absolute: 0 K/uL (ref 0.0–0.1)
Basophils Relative: 0 %
Eosinophils Absolute: 0.1 K/uL (ref 0.0–0.5)
Eosinophils Relative: 2 %
HCT: 28.7 % — ABNORMAL LOW (ref 39.0–52.0)
Hemoglobin: 9.1 g/dL — ABNORMAL LOW (ref 13.0–17.0)
Immature Granulocytes: 1 %
Lymphocytes Relative: 26 %
Lymphs Abs: 1.6 K/uL (ref 0.7–4.0)
MCH: 31.7 pg (ref 26.0–34.0)
MCHC: 31.7 g/dL (ref 30.0–36.0)
MCV: 100 fL (ref 80.0–100.0)
Monocytes Absolute: 1 K/uL (ref 0.1–1.0)
Monocytes Relative: 16 %
Neutro Abs: 3.4 K/uL (ref 1.7–7.7)
Neutrophils Relative %: 55 %
Platelets: 369 K/uL (ref 150–400)
RBC: 2.87 MIL/uL — ABNORMAL LOW (ref 4.22–5.81)
RDW: 18.6 % — ABNORMAL HIGH (ref 11.5–15.5)
WBC: 6.1 K/uL (ref 4.0–10.5)
nRBC: 0 % (ref 0.0–0.2)

## 2023-10-21 LAB — GLUCOSE, CAPILLARY
Glucose-Capillary: 158 mg/dL — ABNORMAL HIGH (ref 70–99)
Glucose-Capillary: 110 mg/dL — ABNORMAL HIGH (ref 70–99)
Glucose-Capillary: 179 mg/dL — ABNORMAL HIGH (ref 70–99)
Glucose-Capillary: 230 mg/dL — ABNORMAL HIGH (ref 70–99)

## 2023-10-21 MED ORDER — DEXTROSE 5 % IV SOLN
56.0000 mg/m2 | Freq: Once | INTRAVENOUS | Status: AC
  Administered 2023-10-21: 120 mg via INTRAVENOUS
  Filled 2023-10-21: qty 60

## 2023-10-21 MED ORDER — FAMOTIDINE IN NACL 20-0.9 MG/50ML-% IV SOLN
20.0000 mg | Freq: Once | INTRAVENOUS | Status: AC
  Administered 2023-10-21: 20 mg via INTRAVENOUS
  Filled 2023-10-21: qty 50

## 2023-10-21 MED ORDER — ACETAMINOPHEN 325 MG PO TABS
650.0000 mg | ORAL_TABLET | Freq: Once | ORAL | Status: AC
  Administered 2023-10-21: 650 mg via ORAL
  Filled 2023-10-21: qty 2

## 2023-10-21 MED ORDER — SODIUM CHLORIDE 0.9 % IV SOLN: INTRAVENOUS | Status: DC

## 2023-10-21 MED ORDER — DIPHENHYDRAMINE HCL 50 MG/ML IJ SOLN
50.0000 mg | Freq: Once | INTRAMUSCULAR | Status: AC
  Administered 2023-10-21: 50 mg via INTRAVENOUS
  Filled 2023-10-21: qty 1

## 2023-10-21 MED ORDER — SODIUM CHLORIDE 0.9 % IV SOLN: Freq: Once | INTRAVENOUS | Status: AC

## 2023-10-21 MED ORDER — SODIUM CHLORIDE 0.9 % IV SOLN
20.0000 mg | Freq: Once | INTRAVENOUS | Status: AC
  Administered 2023-10-21: 20 mg via INTRAVENOUS
  Filled 2023-10-21: qty 1

## 2023-10-21 MED ORDER — SODIUM CHLORIDE 0.9 % IV SOLN
1000.0000 mg | Freq: Once | INTRAVENOUS | Status: AC
  Administered 2023-10-21: 1000 mg via INTRAVENOUS
  Filled 2023-10-21: qty 50

## 2023-10-21 MED ORDER — PALONOSETRON HCL INJECTION 0.25 MG/5ML
0.2500 mg | Freq: Once | INTRAVENOUS | Status: AC
  Administered 2023-10-21: 0.25 mg via INTRAVENOUS
  Filled 2023-10-21 (×2): qty 5

## 2023-10-21 MED ORDER — SODIUM CHLORIDE 0.9 % IV SOLN
400.0000 mg/m2 | Freq: Once | INTRAVENOUS | Status: AC
  Administered 2023-10-21: 1000 mg via INTRAVENOUS
  Filled 2023-10-21: qty 50

## 2023-10-21 NOTE — Plan of Care (Signed)
 Problem: Education: Goal: Knowledge of General Education information will improve Description: Including pain rating scale, medication(s)/side effects and non-pharmacologic comfort measures Outcome: Progressing   Problem: Health Behavior/Discharge Planning: Goal: Ability to manage health-related needs will improve Outcome: Progressing   Problem: Clinical Measurements: Goal: Ability to maintain clinical measurements within normal limits will improve Outcome: Progressing Goal: Will remain free from infection Outcome: Progressing Goal: Diagnostic test results will improve Outcome: Progressing Goal: Respiratory complications will improve Outcome: Progressing Goal: Cardiovascular complication will be avoided Outcome: Progressing   Problem: Activity: Goal: Risk for activity intolerance will decrease Outcome: Progressing   Problem: Nutrition: Goal: Adequate nutrition will be maintained Outcome: Progressing   Problem: Coping: Goal: Level of anxiety will decrease Outcome: Progressing   Problem: Elimination: Goal: Will not experience complications related to bowel motility Outcome: Progressing Goal: Will not experience complications related to urinary retention Outcome: Progressing   Problem: Pain Managment: Goal: General experience of comfort will improve and/or be controlled Outcome: Progressing   Problem: Safety: Goal: Ability to remain free from injury will improve Outcome: Progressing   Problem: Skin Integrity: Goal: Risk for impaired skin integrity will decrease Outcome: Progressing   Problem: Education: Goal: Ability to describe self-care measures that may prevent or decrease complications (Diabetes Survival Skills Education) will improve Outcome: Progressing Goal: Individualized Educational Video(s) Outcome: Progressing   Problem: Coping: Goal: Ability to adjust to condition or change in health will improve Outcome: Progressing   Problem: Fluid  Volume: Goal: Ability to maintain a balanced intake and output will improve Outcome: Progressing   Problem: Health Behavior/Discharge Planning: Goal: Ability to identify and utilize available resources and services will improve Outcome: Progressing Goal: Ability to manage health-related needs will improve Outcome: Progressing   Problem: Metabolic: Goal: Ability to maintain appropriate glucose levels will improve Outcome: Progressing   Problem: Nutritional: Goal: Maintenance of adequate nutrition will improve Outcome: Progressing Goal: Progress toward achieving an optimal weight will improve Outcome: Progressing   Problem: Skin Integrity: Goal: Risk for impaired skin integrity will decrease Outcome: Progressing   Problem: Tissue Perfusion: Goal: Adequacy of tissue perfusion will improve Outcome: Progressing   Problem: Education: Goal: Knowledge about tracheostomy care/management will improve Outcome: Progressing   Problem: Activity: Goal: Ability to tolerate increased activity will improve Outcome: Progressing   Problem: Health Behavior/Discharge Planning: Goal: Ability to manage tracheostomy will improve Outcome: Progressing   Problem: Education: Goal: Knowledge of General Education information will improve Description: Including pain rating scale, medication(s)/side effects and non-pharmacologic comfort measures Outcome: Progressing   Problem: Health Behavior/Discharge Planning: Goal: Ability to manage health-related needs will improve Outcome: Progressing   Problem: Clinical Measurements: Goal: Ability to maintain clinical measurements within normal limits will improve Outcome: Progressing Goal: Will remain free from infection Outcome: Progressing Goal: Diagnostic test results will improve Outcome: Progressing Goal: Respiratory complications will improve Outcome: Progressing Goal: Cardiovascular complication will be avoided Outcome: Progressing    Problem: Activity: Goal: Risk for activity intolerance will decrease Outcome: Progressing   Problem: Nutrition: Goal: Adequate nutrition will be maintained Outcome: Progressing   Problem: Coping: Goal: Level of anxiety will decrease Outcome: Progressing   Problem: Elimination: Goal: Will not experience complications related to bowel motility Outcome: Progressing Goal: Will not experience complications related to urinary retention Outcome: Progressing   Problem: Pain Managment: Goal: General experience of comfort will improve and/or be controlled Outcome: Progressing   Problem: Safety: Goal: Ability to remain free from injury will improve Outcome: Progressing   Problem: Skin Integrity: Goal: Risk for impaired  skin integrity will decrease Outcome: Progressing

## 2023-10-21 NOTE — Consult Note (Signed)
 WOC Nurse wound follow up Wound type: Pressure injury stage 3 Measurement: 3 cm x 1.8 cm x 0.1 cm Wound bed: 100% granulation tissue Drainage (amount, consistency, odor) Minimum amount, no odor, serous. Periwound: intact Dressing procedure/placement/frequency: Cleanse with saline, pat dry the peri-wound skin. Apply Aquacel [id number] into wound bed. Cover with foam dressing. Change every 3 days or PRN soiling.  WOC team will follow weekly Please reconsult if further assistance is needed. Thank-you,  Lela Holm BSN, CNS, RN, [employer], WOCN  (Phone [phone])

## 2023-10-21 NOTE — Progress Notes (Signed)
 Occupational Therapy Treatment Patient Details Name: Frank [name]. MRN: [id number] DOB: 1965/09/09 Today's Date: 10/21/2023   History of present illness 58 y.o. male transferred from [facility] 07/03/23 to [facility] for management of newly diagnosed plasma cell myeloma with weakness and AMS. Pt also with AKI and metabolic encephalopathy. 5/10 transfer to [facility] for iHD. 4/22 Intubated and chemo initiated. 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 5/6 IVIG started. 5/8-6/11 trach. 5/12 iHD initiated. 5/29 PEG placed. 6/5 chemo initiated. PMHx:Lt THA, carpal tunnel syndrome, Lt TKA, Lt humerus fx, PAF, HTN, HLD, bipolar disorder, obesity, T2DM   OT comments  STAR Patient OT session: Patient received in supine after completing chemo. Patient stating fatigue but willing to participate. Patient attempting to get to EOB without assistance but require mod assist with trunk to complete. Patient performed grooming, UB bathing and gown change while seated on EOB.  LUE shoulder AROM and scapular exercises performed while seated on EOB. Lateral scooting attempted to scoot towards [facility] with patient requiring max assist on this date. Patient returned to supine with total assist of 1. Acute OT to continue to follow with STAR program to address established goals.       If plan is discharge home, recommend the following:  Two people to help with walking and/or transfers;Assistance with cooking/housework;Assistance with feeding;Direct supervision/assist for medications management;Direct supervision/assist for financial management;Assist for transportation;Help with stairs or ramp for entrance;A lot of help with bathing/dressing/bathroom   Equipment Recommendations  Other (comment) (TBD)    Recommendations for Other Services      Precautions / Restrictions Precautions Precautions: Fall Recall of Precautions/Restrictions: Impaired Restrictions Weight Bearing Restrictions Per Provider Order: Yes LUE Weight Bearing Per  Provider Order: Non weight bearing Other Position/Activity Restrictions: ROM to tolerance       Mobility Bed Mobility Overal bed mobility: Needs Assistance Bed Mobility: Supine to Sit, Sit to Supine     Supine to sit: Mod assist, HOB elevated, Used rails Sit to supine: Total assist   General bed mobility comments: patient able to move BLE off EOB and raised HOB to assist with trunk with mod assist to complete. Assistance with UB/LB to return to supine    Transfers Overall transfer level: Needs assistance Equipment used: None              Lateral/Scoot Transfers: Max assist General transfer comment: lateral scooting towards HOB with max assist due to fatigue     Balance Overall balance assessment: Needs assistance Sitting-balance support: Feet supported, No upper extremity supported Sitting balance-Leahy Scale: Fair Sitting balance - Comments: EOB with supervision                                   ADL either performed or assessed with clinical judgement   ADL Overall ADL's : Needs assistance/impaired     Grooming: Wash/dry face;Set up;Sitting Grooming Details (indicate cue type and reason): on EOB Upper Body Bathing: Moderate assistance;Sitting Upper Body Bathing Details (indicate cue type and reason): on EOB     Upper Body Dressing : Sitting;Moderate assistance Upper Body Dressing Details (indicate cue type and reason): don gown using RUE.                   General ADL Comments: performed UB bathing and dressing seated on EOB due to wet gown    Extremity/Trunk Assessment  Vision       Perception     Praxis     Communication Communication Communication: Impaired Factors Affecting Communication: Reduced clarity of speech   Cognition Arousal: Alert Behavior During Therapy: WFL for tasks assessed/performed Cognition: Cognition impaired           Executive functioning impairment (select all impairments):  Problem solving OT - Cognition Comments: Fatigued from Chemo                 Following commands: Impaired Following commands impaired: Follows one step commands with increased time      Cueing   Cueing Techniques: Verbal cues, Tactile cues, Visual cues  Exercises Exercises: Other exercises Other Exercises Other Exercises: Arm swings x10 LLE Other Exercises: Arm Circles x15 LUE Other Exercises: scapular retraction x10 with 3-5 second hold    Shoulder Instructions       General Comments VSS on RA    Pertinent Vitals/ Pain       Pain Assessment Pain Assessment: Faces Faces Pain Scale: Hurts even more Pain Location: LUE and right knee Pain Descriptors / Indicators: Discomfort, Aching, Guarding Pain Intervention(s): Limited activity within patient's tolerance, Monitored during session, Repositioned  Home Living                                          Prior Functioning/Environment              Frequency  Min 1X/week (STAR Program)        Progress Toward Goals  OT Goals(current goals can now be found in the care plan section)  Progress towards OT goals: Progressing toward goals  Acute Rehab OT Goals Patient Stated Goal: to go home OT Goal Formulation: With patient Time For Goal Achievement: 10/29/23 Potential to Achieve Goals: Fair ADL Goals Pt Will Perform Grooming: sitting;with set-up (A/E PRN) Pt Will Perform Upper Body Dressing: sitting;with contact guard assist;with adaptive equipment Pt Will Perform Lower Body Dressing: sitting/lateral leans;sit to/from stand;with mod assist (Using one-handed RUE techniques) Pt Will Transfer to Toilet: with contact guard assist;bedside commode;with transfer board Pt/caregiver will Perform Home Exercise Program: Increased ROM;Left upper extremity;With written HEP provided;Independently Additional ADL Goal #1: Pt will complete bed mobility with min A as a precursor to EOB ADLs Additional ADL Goal  #2: Pt will demonstrate ability to stand statically for >3 mins to demonstrate improving tolerance in prep for OOB mobility Additional ADL Goal #3: Pt will demonstrate independence with sensory stimulation to LUE to improve phantom symptoms.  Plan      Co-evaluation                 AM-PAC OT 6 Clicks Daily Activity     Outcome Measure   Help from another person eating meals?: A Little Help from another person taking care of personal grooming?: A Little Help from another person toileting, which includes using toliet, bedpan, or urinal?: Total Help from another person bathing (including washing, rinsing, drying)?: A Lot Help from another person to put on and taking off regular upper body clothing?: A Lot Help from another person to put on and taking off regular lower body clothing?: A Lot 6 Click Score: 13    End of Session    OT Visit Diagnosis: Unsteadiness on feet (R26.81);Other abnormalities of gait and mobility (R26.89);Muscle weakness (generalized) (M62.81);Pain Pain - Right/Left: Left Pain - part of body:  Shoulder   Activity Tolerance Patient limited by fatigue   Patient Left in bed;with call bell/phone within reach   Nurse Communication Mobility status        Time: 8585-8554 OT Time Calculation (min): 31 min  Charges: OT General Charges $OT Visit: 1 Visit OT Treatments $Self Care/Home Management : 8-22 mins $Therapeutic Exercise: 8-22 mins  Frank [name], OTA Acute Rehabilitation Services  Office [phone]   Frank [name] 10/21/2023, 2:54 PM

## 2023-10-21 NOTE — Progress Notes (Signed)
 Frank [name] Received inpatient chemo treatment today. Consent was given to start treatment by patient. Port a cath was already accessed. Tip was in appropriate place from last chest x ray. Blood return was noted prior to treatment. Patient received Kyprolis , Cytoxan  and Sarclisa . Patient tolerated treatment well with no issues. Chemotherapy line as well as finished chemotherapy bags were disposed of appropriately. Patient resting well in bed with no issues. Hazardous Drug advisory signage was placed on patient door.

## 2023-10-21 NOTE — Progress Notes (Addendum)
 Ok to proceed with chemotherapy today with elevated HR and ANC from 10/06/23.  Add Aloxi  to premeds. Ok to continue Sarclisa  and Cytoxan  at 1000mg .  Bridgett Leach Jennings, [place], BCPS, BCOP 10/21/2023 8:08 AM

## 2023-10-21 NOTE — Progress Notes (Signed)
 [facility]                                                                              Frank [name], is a 58 y.o. male, DOB - 08-Aug-1965, FMW:[id number] Admit date - 07/03/2023    Outpatient Primary MD for the patient is Frank Clem Pitts, MD  LOS - 110  days  No chief complaint on file.      Brief summary   58 year old PMH OSA CPAP, pathological fracture and multiple ORIF left Humerus likely due to multiple myeloma, A-fib not on anticoagulation, hyperlipidemia, tobacco use disorder who was feeling weak for 1 week, admitted to [facility] health with concern of sepsis, community-acquired pneumonia and acute encephalopathy. Patient was found to have lytic lesions, he was transferred to [facility] for oncology treatment. In the meantime he developed encephalopathy, acute renal failure. He remained in the ICU for 36 days. Underwent tracheostomy. He was also started on HD after CRRT,off of HD now. Subsequently PEG tube placed 5/29 and IR drain placement of right thigh hip infected hematoma on 7/11 . S/p PEG tube removal. Needs Port-A-Cath placement (for outpatient chemo), either during this hospitalization or as an outpatient. F/u IR recommendations for drain management. Started on heparin  drip on 7/17 because of b/l LE DVT.  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 on 7/28.  Surgical EBL 750 cc. Oncology and IR following.   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 - continue pain management, currently controlled. - Wound VAC LUE removed on 8/1 - Per Ortho, dry dressing changes, continue eliquis    Infected right flank/gluteal hematoma -Underwent IR drain placement for infected Hematoma on 7/11.  -Drain exchanged on 7/25. -Completed antibiotic course on 7/26, cultures negative  - Continue  drain, IR following, planning repeat sclerotherapy on 8/7   Sepsis in the setting of Staph Epi Bacteremia  -blood culture with MRSE in 1 out of 4 bottles.  - Completed a course of vancomycin , 6/20, received a line holiday.   - Repeat blood culture 6/9 without growth.  Completed zyvox  on 7/26   Multiple myeloma:  - Started chemo with Velcade /Cytoxan  on 08/21/2023.  Chemo changed to Sarclisa /Kyprolis /Cytoxan  later on.  Currently on hold. -Myeloma responded well to treatment, IgG decreased from [phone] (normal range). .- Port-A-Cath placed on 7/30 by IR - Starting chemotherapy today   AKI on CKD-3A/uremia:  - due to MM, hypercalcemia and ACE inhibitors?  Briefly required hemodialysis.   - AKI resolved and TDC removed.  Cr started to trend up again likely for multiple myeloma. - Creatinine stable   BLE DVT:  - US  venous doppler on 7/16 positive for DVT in his RLE PTV, left CFV, SFJ, and PFV. - Continue Eliquis    Left sided pleuritic chest wall pain:   - No rib fractures seen on CXR - Pain control   Aspergillosis with pneumonia  -Continue voriconazole .  End date on 8/5.   Acute Hypoxic Respiratory Failure -  trach removed 6/11.  Decannulated.   Severe acute metabolic encephalopathy with hypoactive delirium, seizure disorder - Normal MRI of the brain.  LP negative for meningitis or encephalitis.  EEG negative for seizures but encephalopathy.  -Resolved   Acute postoperative blood loss anemia superimposed on anemia of renal disease:  - H/H stable    IDDM-2 with hyperglycemia - Hb A1c 6.0%. -Continue with sliding scale insulin .   CBG (last 3)  Recent Labs    10/20/23 2106 10/21/23 0757 10/21/23 1142  GLUCAP 166* 158* 110*   Continue glargine 13 units daily Continue moderate SSI, NovoLog  2 units 3 times daily AC    Paroxysmal A-fib:: Rate controlled. -Continue verapamil  - Continue Eliquis  for anticoagulation.   Essential hypertension: Normotensive. - Continue  verapamil    Obstructive Sleep Apnea  - Trach removed. CPAP    Anxiety/Bipolar disorder  -  Continue Paxil .     Hypokalemia/hyperkalemia: K5.8. - Potassium stable   Hyponatremia:  - Improving   Dysphagia:  - Resolved.  PEG tube removed.  On regular diet.    RN Pressure Injury Documentation: Pressure Injury 07/05/23 Heel Left;Right Unstageable - Full thickness tissue loss in which the base of the injury is covered by slough (yellow, tan, gray, green or brown) and/or eschar (tan, brown or black) in the wound bed. (Active)  07/05/23 1108  Location: Heel  Location Orientation: Left;Right  Staging: Unstageable - Full thickness tissue loss in which the base of the injury is covered by slough (yellow, tan, gray, green or brown) and/or eschar (tan, brown or black) in the wound bed.  Wound Description (Comments):   DO NOT USE:  Present on Admission: Yes  Dressing Type Other (Comment) 10/19/23 2000     Pressure Injury 07/11/23 Sacrum Mid Unstageable - Full thickness tissue loss in which the base of the injury is covered by slough (yellow, tan, gray, green or brown) and/or eschar (tan, brown or black) in the wound bed. (Active)  07/11/23 1600  Location: Sacrum  Location Orientation: Mid  Staging: Unstageable - Full thickness tissue loss in which the base of the injury is covered by slough (yellow, tan, gray, green or brown) and/or eschar (tan, brown or black) in the wound bed.  Wound Description (Comments):   DO NOT USE:  Present on Admission: No  Dressing Type Foam - Lift dressing to assess site every shift 10/19/23 0848     Pressure Injury 07/27/23 Buttocks Right;Lower (Active)  07/27/23 1700  Location: Buttocks  Location Orientation: Right;Lower  Staging:   Wound Description (Comments):   DO NOT USE:  Present on Admission: Yes  Dressing Type None 10/13/23 0515     Pressure Injury 08/25/23 Buttocks Left Stage 2 -  Partial thickness loss of dermis presenting as a shallow open  injury with a red, pink wound bed without slough. (Active)  08/25/23   Location: Buttocks  Location Orientation: Left  Staging: Stage 2 -  Partial thickness loss of dermis presenting as a shallow open injury with a red, pink wound bed without slough.  Wound Description (Comments):   DO NOT USE:  Present on Admission: No  Dressing Type Silver dressings;Foam - Lift dressing to assess site every shift 10/21/23 0920    Severe protein calorie malnutrition  Nutrition Problem: Severe Malnutrition Etiology: acute illness (prolonged illness, interuptions in feeding) Signs/Symptoms: moderate fat depletion, moderate muscle depletion, percent weight loss (9.6% x 1 month) Percent weight loss: 9.6 % Interventions: Ensure Enlive (each supplement provides 350kcal and 20 grams of  protein), MVI, Liberalize Diet, Prostat, Juven  Obesity class I Estimated body mass index is 30.5 kg/m as calculated from the following:   Height as of this encounter: 6' (1.829 m).   Weight as of this encounter: 102 kg.  Code Status: full  DVT Prophylaxis:  SCDs Start: 10/13/23 1123 Place and maintain sequential compression device Start: 07/14/23 1033 apixaban  (ELIQUIS ) tablet 5 mg   Level of Care: Level of care: Telemetry Medical Family Communication:  Disposition Plan:      Remains inpatient appropriate:   TBD, likely once the drain is out, repeat sclerotherapy by IR on 8/7   Procedures:    Consultants:     Antimicrobials:   Anti-infectives (From admission, onward)    Start     Dose/Rate Route Frequency Ordered Stop   10/23/23 0800  doxycycline  200mg  in 20 mL SWI irrigation / sclerosing solution for IR       Note to Pharmacy: TO IR ON 8/7   200 mg Irrigation To Radiology 10/16/23 1257 10/24/23 0800   10/23/23 0800  doxycycline  200mg  in 20 mL SWI irrigation / sclerosing solution for IR       Note to Pharmacy: TO IR ON 8/7 FOR PROCEDURE   200 mg Irrigation To Radiology 10/16/23 1258 10/24/23 0800   10/18/23  1000  voriconazole  (VFEND ) tablet 450 mg        450 mg Oral Daily 10/17/23 1457 10/21/23 0851   10/17/23 2200  voriconazole  (VFEND ) tablet 400 mg        400 mg Oral Daily at bedtime 10/17/23 1457 10/22/23 2159   10/13/23 1400  ceFAZolin  (ANCEF ) IVPB 2g/100 mL premix        2 g 200 mL/hr over 30 Minutes Intravenous Every 8 hours 10/13/23 1254 10/14/23 0618   10/13/23 0937  vancomycin  (VANCOCIN ) powder  Status:  Discontinued          As needed 10/13/23 0937 10/13/23 1002   10/10/23 0900  doxycycline  200mg  in 20 mL SWI irrigation / sclerosing solution for IR       Note to Pharmacy: For irrigation only. WARNING - SCLEROSING AGENT  #5 vials sent to IR for procedure   500 mg Irrigation To Radiology 10/07/23 1418 10/10/23 1015   10/10/23 0000  doxycycline  (VIBRAMYCIN ) 500 mg in dextrose  5 % 250 mL IVPB  Status:  Discontinued        500 mg 125 mL/hr over 120 Minutes Intravenous To Radiology 10/07/23 1402 10/07/23 1409   10/10/23 0000  doxycycline  200mg  in 20 mL SWI irrigation / sclerosing solution for IR  Status:  Discontinued        200 mg Irrigation To Radiology 10/07/23 1418 10/07/23 1456   10/10/23 0000  doxycycline  200mg  in 20 mL SWI irrigation / sclerosing solution for IR  Status:  Discontinued        200 mg Irrigation To Radiology 10/07/23 1440 10/07/23 1456   10/02/23 2200  linezolid  (ZYVOX ) tablet 600 mg        600 mg Oral Every 12 hours 10/01/23 0852 10/09/23 2108   10/02/23 1000  linezolid  (ZYVOX ) tablet 600 mg  Status:  Discontinued        600 mg Oral Every 12 hours 10/01/23 0723 10/01/23 0852   09/30/23 1300  vancomycin  variable dose per unstable renal function (pharmacist dosing)  Status:  Discontinued         Does not apply See admin instructions 09/30/23 1301 10/01/23 0725   09/29/23 1145  amoxicillin -clavulanate (  AUGMENTIN ) 875-125 MG per tablet 1 tablet        1 tablet Oral Every 12 hours 09/29/23 1047 10/09/23 2108   09/27/23 0200  vancomycin  (VANCOREADY) IVPB 1500 mg/300 mL         1,500 mg 150 mL/hr over 120 Minutes Intravenous Every 12 hours 09/26/23 1402 09/30/23 1448   09/26/23 1500  cefTRIAXone  (ROCEPHIN ) 2 g in sodium chloride  0.9 % 100 mL IVPB  Status:  Discontinued        2 g 200 mL/hr over 30 Minutes Intravenous Every 24 hours 09/26/23 1402 09/29/23 1047   09/26/23 1315  vancomycin  (VANCOREADY) IVPB 2000 mg/400 mL        2,000 mg 200 mL/hr over 120 Minutes Intravenous  Once 09/26/23 1220 09/26/23 1656   09/26/23 1230  metroNIDAZOLE  (FLAGYL ) IVPB 500 mg  Status:  Discontinued        500 mg 100 mL/hr over 60 Minutes Intravenous Every 12 hours 09/26/23 1137 09/29/23 1138   09/22/23 2200  voriconazole  (VFEND ) tablet 400 mg  Status:  Discontinued        400 mg Oral Every 12 hours 09/22/23 1521 10/17/23 1457   09/11/23 2200  acyclovir  (ZOVIRAX ) 200 MG capsule 400 mg       Note to Pharmacy: Give after dialysis the pm dose, on dialysis days   400 mg Oral 2 times daily 09/11/23 1551     09/11/23 2200  voriconazole  (VFEND ) tablet 350 mg  Status:  Discontinued        350 mg Oral Every 12 hours 09/11/23 1551 09/22/23 1521   09/07/23 1000  acyclovir  (ZOVIRAX ) 200 MG capsule 400 mg  Status:  Discontinued       Note to Pharmacy: Give after dialysis the pm dose, on dialysis days   400 mg Per Tube 2 times daily 09/07/23 0825 09/11/23 1551   09/03/23 1200  vancomycin  (VANCOCIN ) IVPB 1000 mg/200 mL premix  Status:  Discontinued        1,000 mg 200 mL/hr over 60 Minutes Intravenous Every M-W-F (Hemodialysis) 09/02/23 1021 09/03/23 0942   09/03/23 0942  vancomycin  variable dose per unstable renal function (pharmacist dosing)  Status:  Discontinued         Does not apply See admin instructions 09/03/23 0942 09/03/23 0945   09/02/23 0915  vancomycin  (VANCOCIN ) IVPB 1000 mg/200 mL premix        1,000 mg 200 mL/hr over 60 Minutes Intravenous  Once 09/02/23 0830 09/02/23 1215   09/01/23 0834  ceFAZolin  (ANCEF ) IVPB 2g/100 mL premix        over 30 Minutes Intravenous  Continuous PRN 09/01/23 0834 09/01/23 0834   08/29/23 0914  vancomycin  variable dose per unstable renal function (pharmacist dosing)  Status:  Discontinued         Does not apply See admin instructions 08/29/23 0914 09/02/23 1021   08/27/23 1400  vancomycin  (VANCOCIN ) IVPB 1000 mg/200 mL premix        1,000 mg 200 mL/hr over 60 Minutes Intravenous  Once 08/27/23 0920 08/27/23 1653   08/25/23 1200  vancomycin  (VANCOCIN ) IVPB 1000 mg/200 mL premix        1,000 mg 200 mL/hr over 60 Minutes Intravenous Every M-W-F (Hemodialysis) 08/24/23 1405 08/29/23 1240   08/24/23 1445  vancomycin  (VANCOREADY) IVPB 2000 mg/400 mL        2,000 mg 200 mL/hr over 120 Minutes Intravenous  Once 08/24/23 1353 08/25/23 0323   08/14/23 0600  ceFAZolin  (ANCEF ) IVPB 2g/100  mL premix        2 g 200 mL/hr over 30 Minutes Intravenous To Radiology 08/13/23 1055 08/14/23 0606   08/09/23 1000  acyclovir  (ZOVIRAX ) 200 MG capsule 200 mg  Status:  Discontinued       Note to Pharmacy: Give after dialysis the pm dose, on dialysis days   200 mg Per Tube 2 times daily 08/08/23 2358 09/07/23 0825   08/09/23 0100  acyclovir  (ZOVIRAX ) 200 MG capsule 200 mg       Note to Pharmacy: Give after dialysis the pm dose, on dialysis days   200 mg Per Tube  Once 08/09/23 0009 08/09/23 0013   08/08/23 2345  acyclovir  (ZOVIRAX ) 200 MG capsule 200 mg  Status:  Discontinued       Note to Pharmacy: Give after dialysis the pm dose, on dialysis days   200 mg Oral 2 times daily 08/08/23 2257 08/08/23 2358   08/07/23 1530  ceFAZolin  (ANCEF ) IVPB 2g/100 mL premix        over 30 Minutes Intravenous Continuous PRN 08/07/23 1555 08/07/23 1530   08/05/23 2200  voriconazole  (VFEND ) tablet 350 mg  Status:  Discontinued        350 mg Per Tube Every 12 hours 08/05/23 1511 09/11/23 1551   08/01/23 1200  vancomycin  (VANCOCIN ) IVPB 1000 mg/200 mL premix        1,000 mg 200 mL/hr over 60 Minutes Intravenous Every M-W-F (Hemodialysis) 07/31/23 0649 08/01/23 1652    07/30/23 1200  vancomycin  (VANCOCIN ) IVPB 1000 mg/200 mL premix        1,000 mg 200 mL/hr over 60 Minutes Intravenous Every M-W-F (Hemodialysis) 07/30/23 0851 07/30/23 1241   07/29/23 0900  vancomycin  (VANCOCIN ) IVPB 1000 mg/200 mL premix        1,000 mg 200 mL/hr over 60 Minutes Intravenous  Once 07/29/23 0812 07/29/23 0943   07/28/23 1204  vancomycin  variable dose per unstable renal function (pharmacist dosing)  Status:  Discontinued         Does not apply See admin instructions 07/28/23 1204 07/31/23 0649   07/28/23 1000  voriconazole  (VFEND ) 350 mg in sodium chloride  0.9 % 100 mL IVPB  Status:  Discontinued       Placed in Followed by Linked Group   4 mg/kg  88.6 kg (Adjusted) 67.5 mL/hr over 120 Minutes Intravenous Every 12 hours 07/28/23 0748 08/05/23 1511   07/27/23 1000  voriconazole  (VFEND ) 400 mg in sodium chloride  0.9 % 100 mL IVPB  Status:  Discontinued       Placed in Followed by Linked Group   4 mg/kg  100.6 kg 70 mL/hr over 120 Minutes Intravenous Every 12 hours 07/26/23 0826 07/28/23 0748   07/27/23 0945  vancomycin  (VANCOREADY) IVPB 2000 mg/400 mL        2,000 mg 200 mL/hr over 120 Minutes Intravenous  Once 07/27/23 0849 07/27/23 1237   07/26/23 1515  Ampicillin -Sulbactam (UNASYN ) 3 g in sodium chloride  0.9 % 100 mL IVPB  Status:  Discontinued        3 g 200 mL/hr over 30 Minutes Intravenous Every 8 hours 07/26/23 1418 07/27/23 0830   07/26/23 0915  voriconazole  (VFEND ) 600 mg in sodium chloride  0.9 % 150 mL IVPB       Placed in Followed by Linked Group   6 mg/kg  100.6 kg 105 mL/hr over 120 Minutes Intravenous Every 12 hours 07/26/23 0826 07/27/23 0048   07/26/23 0900  Ampicillin -Sulbactam (UNASYN ) 3 g in sodium chloride  0.9 %  100 mL IVPB  Status:  Discontinued        3 g 200 mL/hr over 30 Minutes Intravenous Every 12 hours 07/26/23 0826 07/26/23 1418   07/23/23 1000  levofloxacin  (LEVAQUIN ) IVPB 500 mg  Status:  Discontinued       Placed in Followed by  Linked Group   500 mg 100 mL/hr over 60 Minutes Intravenous Every 24 hours 07/22/23 1213 07/23/23 1001   07/23/23 1000  levofloxacin  (LEVAQUIN ) IVPB 500 mg  Status:  Discontinued       Placed in Followed by Linked Group   500 mg 100 mL/hr over 60 Minutes Intravenous Every 24 hours 07/23/23 1001 07/28/23 0804   07/22/23 1300  levofloxacin  (LEVAQUIN ) IVPB 750 mg       Placed in Followed by Linked Group   750 mg 100 mL/hr over 90 Minutes Intravenous  Once 07/22/23 1213 07/22/23 1542   07/21/23 1600  voriconazole  (VFEND ) 300 mg in sodium chloride  0.9 % 100 mL IVPB  Status:  Discontinued        300 mg 65 mL/hr over 120 Minutes Intravenous Every 12 hours 07/21/23 1430 07/22/23 1415   07/21/23 1000  voriconazole  (VFEND ) tablet 200 mg  Status:  Discontinued        200 mg Oral Every 12 hours 07/20/23 0944 07/21/23 1015   07/20/23 1030  voriconazole  (VFEND ) 530 mg in sodium chloride  0.9 % 150 mL IVPB        6 mg/kg  88.6 kg (Adjusted) 101.5 mL/hr over 120 Minutes Intravenous Every 12 hours 07/20/23 0944 07/21/23 0030   07/19/23 1400  meropenem  (MERREM ) 1 g in sodium chloride  0.9 % 100 mL IVPB  Status:  Discontinued        1 g 200 mL/hr over 30 Minutes Intravenous Every 8 hours 07/19/23 1143 07/22/23 1213   07/18/23 0600  meropenem  (MERREM ) 1 g in sodium chloride  0.9 % 100 mL IVPB  Status:  Discontinued        1 g 200 mL/hr over 30 Minutes Intravenous Every 24 hours 07/17/23 0916 07/19/23 1143   07/17/23 0700  meropenem  (MERREM ) 1 g in sodium chloride  0.9 % 100 mL IVPB  Status:  Discontinued        1 g 200 mL/hr over 30 Minutes Intravenous Every 12 hours 07/17/23 0601 07/17/23 0916   07/15/23 1300  vancomycin  (VANCOCIN ) IVPB 1000 mg/200 mL premix  Status:  Discontinued        1,000 mg 200 mL/hr over 60 Minutes Intravenous Every 24 hours 07/14/23 1129 07/22/23 1319   07/14/23 1215  vancomycin  (VANCOREADY) IVPB 1750 mg/350 mL        1,750 mg 175 mL/hr over 120 Minutes Intravenous  Once  07/14/23 1129 07/14/23 1449   07/09/23 1800  piperacillin -tazobactam (ZOSYN ) IVPB 3.375 g  Status:  Discontinued        3.375 g 12.5 mL/hr over 240 Minutes Intravenous Every 12 hours 07/09/23 0756 07/09/23 0844   07/09/23 1400  piperacillin -tazobactam (ZOSYN ) IVPB 3.375 g  Status:  Discontinued        3.375 g 12.5 mL/hr over 240 Minutes Intravenous Every 8 hours 07/09/23 0844 07/17/23 0557   07/08/23 1300  piperacillin -tazobactam (ZOSYN ) IVPB 3.375 g  Status:  Discontinued        3.375 g 12.5 mL/hr over 240 Minutes Intravenous Every 8 hours 07/08/23 1129 07/09/23 0756   07/05/23 2200  azithromycin  (ZITHROMAX ) 500 mg in sodium chloride  0.9 % 250 mL IVPB  500 mg 250 mL/hr over 60 Minutes Intravenous Daily at bedtime 07/05/23 2022 07/06/23 2302   07/05/23 2115  azithromycin  (ZITHROMAX ) 250 mg in dextrose  5 % 125 mL IVPB  Status:  Discontinued       Note to Pharmacy: Missed dose this morning   250 mg 127.5 mL/hr over 60 Minutes Intravenous Every 24 hours 07/05/23 2016 07/05/23 2021   07/04/23 0900  fluconazole  (DIFLUCAN ) IVPB 200 mg  Status:  Discontinued        200 mg 100 mL/hr over 60 Minutes Intravenous Every 48 hours 07/04/23 0722 07/09/23 0809   07/03/23 0600  cefTRIAXone  (ROCEPHIN ) 1 g in sodium chloride  0.9 % 100 mL IVPB  Status:  Discontinued        1 g 200 mL/hr over 30 Minutes Intravenous Every 24 hours 07/03/23 0508 07/08/23 1104   07/03/23 0600  azithromycin  (ZITHROMAX ) tablet 250 mg  Status:  Discontinued        250 mg Oral Daily 07/03/23 0508 07/05/23 2016          Medications  (feeding supplement) PROSource Plus  30 mL Oral TID BM   acyclovir   400 mg Oral BID   apixaban   5 mg Oral BID   collagenase    Topical Daily   cyclophosphamide   400 mg/m2 (Order-Specific) Intravenous Once   docusate sodium   100 mg Oral BID   [START ON 10/23/2023] doxycycline  200mg  in 20 mL SWI irrigation / sclerosing solution for IR  200 mg Irrigation to XRAY   [START ON 10/23/2023]  doxycycline  200mg  in 20 mL SWI irrigation / sclerosing solution for IR  200 mg Irrigation to XRAY   epoetin  alfa  40,000 Units Subcutaneous Q Wed-1800   feeding supplement  237 mL Oral BID WC   guaiFENesin   600 mg Oral BID   heparin  lock flush  500 Units Intravenous Once   hydrALAZINE   25 mg Oral Q8H   insulin  aspart  0-15 Units Subcutaneous TID AC & HS   insulin  aspart  2 Units Subcutaneous TID AC & HS   insulin  glargine-yfgn  13 Units Subcutaneous Q24H   isatuximab -irfc (SARCLISA ) 1,000 mg in sodium chloride  0.9 % 200 mL (4 mg/mL) chemo infusion  1,000 mg Intravenous Once   lidocaine   1 patch Transdermal Q24H   montelukast   5 mg Oral QHS   multivitamin with minerals  1 tablet Oral Daily   nutrition supplement (JUVEN)  1 packet Oral BID AC   mouth rinse  15 mL Mouth Rinse 4 times per day   pantoprazole   40 mg Oral BID   PARoxetine   40 mg Oral Daily   polyethylene glycol  17 g Oral BID   senna  1 tablet Oral Daily   umeclidinium-vilanterol  1 puff Inhalation Daily   valproic  acid  750 mg Oral BID   verapamil   120 mg Oral Daily   voriconazole   400 mg Oral QHS      Subjective:  Frank [name] was seen and examined today.  No acute complaints, eating breakfast, states oncology starting chemotherapy today.  Nausea yesterday but no issues today.  No abdominal pain, nausea vomiting, fevers or chills.    Objective:   Vitals:   10/20/23 1717 10/20/23 1956 10/21/23 0413 10/21/23 0757  BP: (!) 133/96 118/78 118/89 124/89  Pulse: (!) 101 96 90 99  Resp:  16 16 18   Temp: 98.8 F (37.1 C) 98 F (36.7 C) 97.8 F (36.6 C) 98.3 F (36.8 C)  TempSrc:  Oral Oral  Oral  SpO2: 99% 97% 95% 96%  Weight:      Height:        Intake/Output Summary (Last 24 hours) at 10/21/2023 1225 Last data filed at 10/21/2023 0920 Gross per 24 hour  Intake 990 ml  Output 4790 ml  Net -3800 ml     Wt Readings from Last 3 Encounters:  10/18/23 102 kg  06/05/23 113.7 kg  01/29/23 117.9 kg   Physical  Exam General: Alert and oriented x 3, NAD Cardiovascular: S1 S2 clear, RRR.  Respiratory: CTAB, no wheezing, rales Gastrointestinal: Soft, nontender, nondistended, NBS Ext: LUE above elbow amputation, drain right hip Neuro: no new deficits Psych: Normal affect    Data Reviewed:  I have personally reviewed following labs    CBC Lab Results  Component Value Date   WBC 6.1 10/21/2023   RBC 2.87 (L) 10/21/2023   HGB 9.1 (L) 10/21/2023   HCT 28.7 (L) 10/21/2023   MCV 100.0 10/21/2023   MCH 31.7 10/21/2023   PLT 369 10/21/2023   MCHC 31.7 10/21/2023   RDW 18.6 (H) 10/21/2023   LYMPHSABS 1.6 10/21/2023   MONOABS 1.0 10/21/2023   EOSABS 0.1 10/21/2023   BASOSABS 0.0 10/21/2023     Last metabolic panel Lab Results  Component Value Date   NA 134 (L) 10/21/2023   K 4.3 10/21/2023   CL 99 10/21/2023   CO2 26 10/21/2023   BUN 45 (H) 10/21/2023   CREATININE 1.26 (H) 10/21/2023   GLUCOSE 136 (H) 10/21/2023   GFRNONAA >60 10/21/2023   GFRAA >60 12/27/2017   CALCIUM  8.8 (L) 10/21/2023   PHOS 4.3 10/17/2023   PROT 5.4 (L) 10/21/2023   ALBUMIN  2.2 (L) 10/21/2023   LABGLOB 2.6 09/12/2023   AGRATIO 0.9 09/12/2023   BILITOT 0.5 10/21/2023   ALKPHOS 62 10/21/2023   AST 9 (L) 10/21/2023   ALT 6 10/21/2023   ANIONGAP 9 10/21/2023    CBG (last 3)  Recent Labs    10/20/23 2106 10/21/23 0757 10/21/23 1142  GLUCAP 166* 158* 110*      Coagulation Profile: No results for input(s): INR, PROTIME in the last 168 hours.   Radiology Studies: I have personally reviewed the imaging studies  No results found.      Nydia Distance M.D. [facility] 10/21/2023, 12:25 PM  Available via Epic secure chat 7am-7pm After 7 pm, please refer to night coverage provider listed on amion.

## 2023-10-21 NOTE — Progress Notes (Signed)
 [name] is doing pretty well.  He had a good day yesterday.  He did Physical therapy.  He did occupational therapy.  He is ready for chemotherapy today.  His labs show sodium 134.  Potassium 4.3.  BUN 45 creatinine 1.26.  Calcium  8.8 with an albumin  of 2.2.  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  His legs look quite good.  I do not see any swelling.  Very still has the drainage catheter in the right hip.  Hopefully, this will come out either Wednesday or Thursday.  He is managing with the left arm disarticulated.  I know that physical therapy is working with him so he can have some more mobility and functionality.  His appetite is good.  He has had no nausea or vomiting.  He had little bit of nausea yesterday but that went away.  He has had no fever.  He has had no bleeding.  There is been no diarrhea.   His vital signs show temperature of 97.8.  Pulse 90.  Blood pressure 118/89.  Head and neck exam shows no ocular or oral lesions.  He has no adenopathy in the neck.  Lungs are clear bilaterally.  He has good air movement bilaterally.  Cardiac exam regular rate and rhythm with no murmurs, rubs or bruits.  Abdomen is soft.  He is somewhat obese.  There is no fluid wave.  There is no guarding or rebound tenderness.  Extremities shows the left arm with the amputation.  The dressing appears to be somewhat dry.  His right arm is with good range of motion.  Legs have no edema.  He has decent strength in his legs.  Neurological exam is nonfocal.   Again, we will go ahead with chemotherapy now.  We have not given the chemotherapy for a good month or so.  This is a fairly aggressive myeloma.  I think we have to get him back on treatment.  His myeloma studies that we last did have looked pretty good.  Probable repeat some studies in the near future.  Again, this is very complicated.  I know he is getting incredible care from everybody upon [facility].  Jeralyn Crease,  MD  [place] 6:9

## 2023-10-21 NOTE — Plan of Care (Signed)
  Problem: Health Behavior/Discharge Planning: Goal: Ability to manage health-related needs will improve Outcome: Progressing   Problem: Clinical Measurements: Goal: Ability to maintain clinical measurements within normal limits will improve Outcome: Progressing   Problem: Nutrition: Goal: Adequate nutrition will be maintained Outcome: Progressing   Problem: Elimination: Goal: Will not experience complications related to bowel motility Outcome: Progressing Goal: Will not experience complications related to urinary retention Outcome: Progressing

## 2023-10-21 NOTE — TOC Progression Note (Signed)
 Transition of Care (TOC) - Progression Note    Patient Details  Name: Frank [name]. MRN: [id number] Date of Birth: December 29, 1965  [facility]) CM/SW Contact  Lauraine FORBES Saa, LCSWA Phone Number: 10/21/2023, 3:24 PM  Clinical Narrative:     3:24 PM CSW introduced self and role to patient. CSW followed up on SNF decision. Patient expressed preference in discharging to [facility] in [place], [place]. CSW sent referral to SNF and relayed preference to medical team.  Expected Discharge Plan: Skilled Nursing Facility Barriers to Discharge: Continued Medical Work up, English as a second language teacher, Other (must enter comment) (SNF pending bed decision)               Expected Discharge Plan and Services In-house Referral: Clinical Social Work Discharge Planning Services: CM Consult Post Acute Care Choice: Skilled Nursing Facility Living arrangements for the past 2 months: Single Family Home                 DME Arranged: N/A DME Agency: NA       HH Arranged: NA HH Agency: NA         Social Drivers of Health (SDOH) Interventions SDOH Screenings   Food Insecurity: No Food Insecurity (07/05/2023)  Housing: Low Risk  (07/05/2023)  Transportation Needs: No Transportation Needs (07/05/2023)  Utilities: Not At Risk (07/05/2023)  Tobacco Use: High Risk (10/13/2023)    Readmission Risk Interventions     No data to display

## 2023-10-22 DIAGNOSIS — A419 Sepsis, unspecified organism: Secondary | ICD-10-CM | POA: Diagnosis not present

## 2023-10-22 DIAGNOSIS — J181 Lobar pneumonia, unspecified organism: Secondary | ICD-10-CM | POA: Diagnosis not present

## 2023-10-22 DIAGNOSIS — B449 Aspergillosis, unspecified: Secondary | ICD-10-CM | POA: Diagnosis not present

## 2023-10-22 DIAGNOSIS — C9000 Multiple myeloma not having achieved remission: Secondary | ICD-10-CM | POA: Diagnosis not present

## 2023-10-22 DIAGNOSIS — R6521 Severe sepsis with septic shock: Secondary | ICD-10-CM | POA: Diagnosis not present

## 2023-10-22 LAB — COMPREHENSIVE METABOLIC PANEL WITH GFR
ALT: 7 U/L (ref 0–44)
AST: 11 U/L — ABNORMAL LOW (ref 15–41)
Albumin: 2.1 g/dL — ABNORMAL LOW (ref 3.5–5.0)
Alkaline Phosphatase: 60 U/L (ref 38–126)
Anion gap: 9 (ref 5–15)
BUN: 44 mg/dL — ABNORMAL HIGH (ref 6–20)
CO2: 25 mmol/L (ref 22–32)
Calcium: 8.3 mg/dL — ABNORMAL LOW (ref 8.9–10.3)
Chloride: 98 mmol/L (ref 98–111)
Creatinine, Ser: 1.27 mg/dL — ABNORMAL HIGH (ref 0.61–1.24)
GFR, Estimated: >60 mL/min (ref >60)
Glucose, Bld: 189 mg/dL — ABNORMAL HIGH (ref 70–99)
Potassium: 4.4 mmol/L (ref 3.5–5.1)
Sodium: 132 mmol/L — ABNORMAL LOW (ref 135–145)
Total Bilirubin: 0.3 mg/dL (ref 0.0–1.2)
Total Protein: 5.2 g/dL — ABNORMAL LOW (ref 6.5–8.1)

## 2023-10-22 LAB — CBC WITH DIFFERENTIAL/PLATELET
Abs Immature Granulocytes: 0.12 K/uL — ABNORMAL HIGH (ref 0.00–0.07)
Basophils Absolute: 0 K/uL (ref 0.0–0.1)
Basophils Relative: 0 %
Eosinophils Absolute: 0 K/uL (ref 0.0–0.5)
Eosinophils Relative: 0 %
HCT: 30.2 % — ABNORMAL LOW (ref 39.0–52.0)
Hemoglobin: 9.5 g/dL — ABNORMAL LOW (ref 13.0–17.0)
Immature Granulocytes: 1 %
Lymphocytes Relative: 11 %
Lymphs Abs: 1 K/uL (ref 0.7–4.0)
MCH: 31.4 pg (ref 26.0–34.0)
MCHC: 31.5 g/dL (ref 30.0–36.0)
MCV: 99.7 fL (ref 80.0–100.0)
Monocytes Absolute: 0.9 K/uL (ref 0.1–1.0)
Monocytes Relative: 10 %
Neutro Abs: 7 K/uL (ref 1.7–7.7)
Neutrophils Relative %: 78 %
Platelets: 371 K/uL (ref 150–400)
RBC: 3.03 MIL/uL — ABNORMAL LOW (ref 4.22–5.81)
RDW: 18.5 % — ABNORMAL HIGH (ref 11.5–15.5)
WBC: 9 K/uL (ref 4.0–10.5)
nRBC: 0 % (ref 0.0–0.2)

## 2023-10-22 LAB — GLUCOSE, CAPILLARY
Glucose-Capillary: 167 mg/dL — ABNORMAL HIGH (ref 70–99)
Glucose-Capillary: 211 mg/dL — ABNORMAL HIGH (ref 70–99)
Glucose-Capillary: 184 mg/dL — ABNORMAL HIGH (ref 70–99)
Glucose-Capillary: 146 mg/dL — ABNORMAL HIGH (ref 70–99)

## 2023-10-22 NOTE — Plan of Care (Signed)
 Problem: Education: Goal: Knowledge of General Education information will improve Description: Including pain rating scale, medication(s)/side effects and non-pharmacologic comfort measures Outcome: Progressing   Problem: Health Behavior/Discharge Planning: Goal: Ability to manage health-related needs will improve Outcome: Progressing   Problem: Clinical Measurements: Goal: Ability to maintain clinical measurements within normal limits will improve Outcome: Progressing Goal: Will remain free from infection Outcome: Progressing Goal: Diagnostic test results will improve Outcome: Progressing Goal: Respiratory complications will improve Outcome: Progressing Goal: Cardiovascular complication will be avoided Outcome: Progressing   Problem: Activity: Goal: Risk for activity intolerance will decrease Outcome: Progressing   Problem: Nutrition: Goal: Adequate nutrition will be maintained Outcome: Progressing   Problem: Coping: Goal: Level of anxiety will decrease Outcome: Progressing   Problem: Elimination: Goal: Will not experience complications related to bowel motility Outcome: Progressing Goal: Will not experience complications related to urinary retention Outcome: Progressing   Problem: Pain Managment: Goal: General experience of comfort will improve and/or be controlled Outcome: Progressing   Problem: Safety: Goal: Ability to remain free from injury will improve Outcome: Progressing   Problem: Skin Integrity: Goal: Risk for impaired skin integrity will decrease Outcome: Progressing   Problem: Education: Goal: Ability to describe self-care measures that may prevent or decrease complications (Diabetes Survival Skills Education) will improve Outcome: Progressing Goal: Individualized Educational Video(s) Outcome: Progressing   Problem: Coping: Goal: Ability to adjust to condition or change in health will improve Outcome: Progressing   Problem: Fluid  Volume: Goal: Ability to maintain a balanced intake and output will improve Outcome: Progressing   Problem: Health Behavior/Discharge Planning: Goal: Ability to identify and utilize available resources and services will improve Outcome: Progressing Goal: Ability to manage health-related needs will improve Outcome: Progressing   Problem: Metabolic: Goal: Ability to maintain appropriate glucose levels will improve Outcome: Progressing   Problem: Nutritional: Goal: Maintenance of adequate nutrition will improve Outcome: Progressing Goal: Progress toward achieving an optimal weight will improve Outcome: Progressing   Problem: Skin Integrity: Goal: Risk for impaired skin integrity will decrease Outcome: Progressing   Problem: Tissue Perfusion: Goal: Adequacy of tissue perfusion will improve Outcome: Progressing   Problem: Education: Goal: Knowledge about tracheostomy care/management will improve Outcome: Progressing   Problem: Activity: Goal: Ability to tolerate increased activity will improve Outcome: Progressing   Problem: Health Behavior/Discharge Planning: Goal: Ability to manage tracheostomy will improve Outcome: Progressing   Problem: Education: Goal: Knowledge of General Education information will improve Description: Including pain rating scale, medication(s)/side effects and non-pharmacologic comfort measures Outcome: Progressing   Problem: Health Behavior/Discharge Planning: Goal: Ability to manage health-related needs will improve Outcome: Progressing   Problem: Clinical Measurements: Goal: Ability to maintain clinical measurements within normal limits will improve Outcome: Progressing Goal: Will remain free from infection Outcome: Progressing Goal: Diagnostic test results will improve Outcome: Progressing Goal: Respiratory complications will improve Outcome: Progressing Goal: Cardiovascular complication will be avoided Outcome: Progressing    Problem: Activity: Goal: Risk for activity intolerance will decrease Outcome: Progressing   Problem: Nutrition: Goal: Adequate nutrition will be maintained Outcome: Progressing   Problem: Coping: Goal: Level of anxiety will decrease Outcome: Progressing   Problem: Elimination: Goal: Will not experience complications related to bowel motility Outcome: Progressing Goal: Will not experience complications related to urinary retention Outcome: Progressing   Problem: Pain Managment: Goal: General experience of comfort will improve and/or be controlled Outcome: Progressing   Problem: Safety: Goal: Ability to remain free from injury will improve Outcome: Progressing   Problem: Skin Integrity: Goal: Risk for impaired  skin integrity will decrease Outcome: Progressing

## 2023-10-22 NOTE — TOC Progression Note (Addendum)
 Transition of Care (TOC) - Progression Note    Patient Details  Name: Frank [name]. MRN: [id number] Date of Birth: 1966/02/07  Transition of Care [facility]) CM/SW Contact  Lauraine FORBES Saa, LCSWA Phone Number: 10/22/2023, 4:09 PM  Clinical Narrative:     4:10 PM CSW followed up with [facility] on patient's SNF referral. SNF admissions informed CSW that patient's referral remains under review. CSW will continue to follow and be available to assist.  Expected Discharge Plan: Skilled Nursing Facility Barriers to Discharge: Continued Medical Work up, English as a second language teacher, Other (must enter comment) (SNF pending bed decision)               Expected Discharge Plan and Services In-house Referral: Clinical Social Work Discharge Planning Services: CM Consult Post Acute Care Choice: Skilled Nursing Facility Living arrangements for the past 2 months: Single Family Home                 DME Arranged: N/A DME Agency: NA       HH Arranged: NA HH Agency: NA         Social Drivers of Health (SDOH) Interventions SDOH Screenings   Food Insecurity: No Food Insecurity (07/05/2023)  Housing: Low Risk  (07/05/2023)  Transportation Needs: No Transportation Needs (07/05/2023)  Utilities: Not At Risk (07/05/2023)  Tobacco Use: High Risk (10/13/2023)    Readmission Risk Interventions     No data to display

## 2023-10-22 NOTE — Progress Notes (Signed)
 PT Cancellation Note  Patient Details Name: Frank [name]. MRN: [id number] DOB: 03/18/1966   Cancelled Treatment:    Reason Eval/Treat Not Completed: (P) Other (comment) Still no appropriate PPE on the floor. PT will follow back for treatment tomorrow.   Faatimah Spielberg B. Fleeta Lapidus PT, DPT Acute Rehabilitation Services Please use secure chat or  Call Office [phone]    [name] 10/22/2023, 2:42 PM

## 2023-10-22 NOTE — Progress Notes (Signed)
 Progress Note   Patient: Frank [name]. FMW:[id number] DOB: [birth year] DOA: 07/03/2023  DOS: the patient was seen and examined on 10/22/2023   Brief hospital course:  58 year old PMH OSA CPAP, pathological fracture and multiple ORIF left Humerus likely due to multiple myeloma, A-fib not on anticoagulation, hyperlipidemia, tobacco use disorder who was feeling weak for 1 week, admitted to [facility] with concern of sepsis, community-acquired pneumonia and acute encephalopathy. Patient was found to have lytic lesions, he was transferred to [facility] for oncology treatment. In the meantime he developed encephalopathy, acute renal failure. He remained in the ICU for 36 days. Underwent tracheostomy. He was also started on HD after CRRT,off of HD now. Subsequently PEG tube placed 5/29 and IR drain placement of right thigh hip infected hematoma on 7/11 . S/p PEG tube removal. Needs Port-A-Cath placement (for outpatient chemo), either during this hospitalization or as an outpatient. F/u IR recommendations for drain management. Started on heparin  drip on 7/17 because of b/l LE DVT.  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 on 7/28.  Surgical EBL 750 cc. Oncology and IR following.  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  Wound VAC removed 8/1.  Dressing changes, Eliquis  per Ortho.  Infected right flank/gluteal hematoma - S/p IR drain placement 7/11.  Drain exchanged 7/25.  Completed antibiotic course 7/26.  IR following, planning repeat sclerotherapy on 8/7.  Concern for sepsis - Completed vancomycin  6/28.  Completed Zyvox  7/26.  Multiple myeloma - Port-A-Cath placed 7/30 by IR.  Started chemo with Velcade /Cytoxan  on 08/21/2023. Chemo changed to Sarclisa /Kyprolis /Cytoxan  later on.  Managed closely by oncology.  Oncology to work on setting up outpatient  chemotherapy in [place] upon discharge.  Acute kidney injury on CKD 3A - Secondary to multiple myeloma.  Briefly required HD.  AKI resolved, TDC removed.  Bilateral lower extremity DVT - Eliquis  on board.  Aspergillosis with pneumonia - Completed voriconazole  8/5.  Acute hypoxic respiratory failure - Trach removed 6/11, decannulated.  Resolved.  Acute metabolic encephalopathy-resolved  Anemia of chronic renal disease - Hemoglobin stable.  Insulin -dependent diabetes mellitus - Continue insulin  sliding scale.  Paroxysmal atrial fibrillation - Rate well-controlled.  Continue verapamil , Eliquis .  Obstructive sleep apnea - CPAP on board.  Dysphagia - Resolved.  PEG tube removed.  On regular diet.  Severe protein calorie malnutrition - Dietary on board.    Subjective: Patient resting comfortably this morning.  Denies any fever, chills, chest pain, nausea, vomiting, abdominal pain.  Appetite is good this morning.  In good spirits.  Physical Exam:  Vitals:   10/21/23 1829 10/21/23 2028 10/22/23 0458 10/22/23 0758  BP: 101/80 101/68 (!) 116/92 125/86  Pulse: 97 (!) 101 100 96  Resp: 20 17 20    Temp: 98 F (36.7 C) 97.7 F (36.5 C) 98.1 F (36.7 C) 98 F (36.7 C)  TempSrc: Oral Oral Oral Oral  SpO2: 95% 97% 96% 96%  Weight:      Height:        GENERAL:  Alert, pleasant, no acute distress  HEENT:  EOMI CARDIOVASCULAR:  RRR, no murmurs appreciated RESPIRATORY:  Clear to auscultation, no wheezing, rales, or rhonchi GASTROINTESTINAL:  Soft, nontender, nondistended EXTREMITIES: L UE above elbow amputation, draining right hip NEURO:  No new focal deficits appreciated SKIN:  No rashes noted PSYCH:  Appropriate mood and affect  Data Reviewed:  Imaging Studies: IR IMAGING GUIDED PORT INSERTION Result Date: 10/15/2023 INDICATION: 58 year old male with history multiple myeloma requiring central venous access for chemotherapy administration EXAM: IMPLANTED PORT A  CATH PLACEMENT WITH ULTRASOUND AND FLUOROSCOPIC GUIDANCE COMPARISON:  None Available. MEDICATIONS: None. ANESTHESIA/SEDATION: Moderate (conscious) sedation was employed during this procedure. A total of Versed  1.5 mg and Fentanyl  75 mcg was administered intravenously. Moderate Sedation Time: 14 minutes. The patient's level of consciousness and vital signs were monitored continuously by radiology nursing throughout the procedure under my direct supervision. CONTRAST:  None FLUOROSCOPY TIME:  Ten mGy reference air kerma COMPLICATIONS: None immediate. PROCEDURE: The procedure, risks, benefits, and alternatives were explained to the patient. Questions regarding the procedure were encouraged and answered. The patient understands and consents to the procedure. The right neck and chest were prepped with chlorhexidine  in a sterile fashion, and a sterile drape was applied covering the operative field. Maximum barrier sterile technique with sterile gowns and gloves were used for the procedure. A timeout was performed prior to the initiation of the procedure. Ultrasound was used to examine the jugular vein which was compressible and free of internal echoes. A skin marker was used to demarcate the planned venotomy and port pocket incision sites. Local anesthesia was provided to these sites and the subcutaneous tunnel track with 1% lidocaine  with 1:100,000 epinephrine . A small incision was created at the jugular access site and blunt dissection was performed of the subcutaneous tissues. Under ultrasound guidance, the jugular vein was accessed with a 21 ga micropuncture needle and an 0.018 wire was inserted to the superior vena cava. Real-time ultrasound guidance was utilized for vascular access including the acquisition of a permanent ultrasound image documenting patency of the accessed vessel. A 5 Fr micopuncture set was then used, through which a 0.035 Rosen wire was passed under fluoroscopic guidance into the inferior vena  cava. An 8 Fr dilator was then placed over the wire. A subcutaneous port pocket was then created along the upper chest wall utilizing a combination of sharp and blunt dissection. The pocket was irrigated with sterile saline, packed with gauze, and observed for hemorrhage. A single lumen ISP sized power injectable port was chosen for placement. The 8 Fr catheter was tunneled from the port pocket site to the venotomy incision. The port was placed in the pocket. The external catheter was trimmed to appropriate length. The dilator was exchanged for an 8 Fr peel-away sheath under fluoroscopic guidance. The catheter was then placed through the sheath and the sheath was removed. Final catheter positioning was confirmed and documented with a fluoroscopic spot radiograph. The port was accessed with a Huber needle, aspirated, and flushed with heparinized saline. The deep dermal layer of the port pocket incision was closed with interrupted 3-0 Vicryl suture. Dermabond was then placed over the port pocket and neck incisions. The patient tolerated the procedure well without immediate post procedural complication. FINDINGS: After catheter placement, the tip lies within the superior cavoatrial junction. The catheter aspirates and flushes normally and is ready for immediate use. IMPRESSION: Successful placement of a power injectable Port-A-Cath via the right internal jugular vein. The catheter is ready for immediate use. Ester Sides, MD [facility] Electronically Signed   By: Ester Sides M.D.   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 Multiple myeloma. EXAM: LEFT HUMERUS - 2+ VIEW COMPARISON:  09/15/2023 FINDINGS: A single  C-arm image demonstrates the recently demonstrated lytic changes in the distal humerus and associated fixation hardware. IMPRESSION: Single C-arm image of  the distal humerus and associated fixation hardware. Electronically Signed   By: Elspeth Bathe M.D.   On: 10/13/2023 11:05   DG C-Arm 1-60 Min-No Report Result Date: 10/13/2023 Fluoroscopy was utilized by the requesting physician.  No radiographic interpretation.   DG C-Arm 1-60 Min-No Report Result Date: 10/13/2023 Fluoroscopy was utilized by the requesting physician.  No radiographic interpretation.   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 Planned sclerosing. EXAM: Sclerosing of abnormal fluid collection COMPARISON:  None Available. MEDICATIONS: 500 mg doxycycline ; intra procedural ANESTHESIA/SEDATION: Moderate (conscious) sedation was employed during this procedure. A total of Versed  2 mg and Fentanyl  100 mcg was administered intravenously by the radiology nurse. Total intra-service moderate Sedation Time: 11 minutes. The patient's level of consciousness and vital signs were monitored continuously by radiology nursing throughout the procedure under my direct supervision. CONTRAST:  10 mL Omnipaque  300-administered into the collecting system(s) FLUOROSCOPY: Radiation Exposure Index (as provided by the fluoroscopic device): 5 mGy Kerma COMPLICATIONS: None immediate. PROCEDURE: Informed written consent was obtained from the patient after a thorough discussion of the procedural risks, benefits and alternatives. All questions were addressed. Maximal Sterile Barrier Technique was utilized including caps, mask, sterile gowns, sterile gloves, sterile drape, hand hygiene and skin antiseptic. A timeout was performed prior to the initiation of the procedure. The indwelling catheter in the right hip region prepped and draped in usual sterile fashion. The indwelling 10 French catheter was aggressively irrigated with normal saline and flushed. Dilute contrast was then injected under fluoroscopic guidance into the  catheter to evaluate if the fluid collection was communicating with vascular or lymphatic structure. Contrast flows freely into the catheter and outlines the fluid collection which is irregular in appearance, however there is no vascular or lymphatic communication identified. The contrast was then mostly withdrawn. The catheter and retention sutures were cut. A new 10 French catheter was then advanced over a guidewire into the fluid collection and repositioned in a more cephalad location. Approximately 500 mg of doxycycline  was reconstituted with 50 mL of normal saline and then approximately 35 mL of this mixture was injected into the 10 French pigtail catheter. The catheter was then capped. Retention suture and sterile dressing were applied. The doxycycline  will dwell within the cavity for approximately 1 hour and then the catheter will be connected to a JP bulb and the patient will be monitored for output. IMPRESSION: Satisfactory exchange of a 10 French right hip drainage catheter with sclerosing using doxycycline  as described above. Catheter will remain to a JP bulb until output has sufficiently diminished. Electronically Signed   By: Cordella Banner   On: 10/10/2023 17:18   CT PELVIS WO CONTRAST Result Date: 10/07/2023 CLINICAL DATA:  Right hip drain evaluation. EXAM: CT PELVIS WITHOUT CONTRAST TECHNIQUE: Multidetector CT imaging of the pelvis was performed following the standard protocol without intravenous contrast. RADIATION DOSE REDUCTION: This exam was performed according to the departmental dose-optimization program which includes automated exposure control, adjustment of the mA and/or kV according to patient size and/or use of iterative reconstruction technique. COMPARISON:  CT abdomen pelvis dated 07/08/2023. FINDINGS: Urinary Tract: The visualized ureters appear unremarkable. Punctate stone noted in the posterior bladder lumen. The urinary bladder is otherwise unremarkable. Bowel:  Moderate stool  in the colon.  The appendix is normal. Vascular/Lymphatic:  Moderate aortoiliac atherosclerotic disease. No pelvic adenopathy. Reproductive: The prostate and seminal vesicles are grossly unremarkable. Other: Right gluteal pigtail drainage catheter. Significant interval decrease in the size of the hematoma in the right gluteal muscle now measuring approximately 4.7 x 9.3 cm. Additional loculated collection in the anterior upper right thigh significantly decreased in size since the prior CT and measures approximately 10 x 17 cm in greatest axial dimensions. Musculoskeletal: Osteopenia with degenerative changes of the spine. Total left hip arthroplasty. Scattered lucent lesions throughout the bones as seen on the prior CT. IMPRESSION: 1. Right gluteal pigtail drainage catheter with significant interval decrease in the size of the hematoma in the right gluteal muscle. 2. Significant interval decrease in the size of the loculated collection in the anterior upper right thigh. 3. Punctate stone in the posterior bladder lumen. 4.  Aortic Atherosclerosis (ICD10-I70.0). Electronically Signed   By: Vanetta Chou M.D.   On: 10/07/2023 16:53   DG CHEST PORT 1 VIEW Result Date: 10/06/2023 EXAM: 1 VIEW XRAY OF THE CHEST 10/06/2023 08:52:00 PM COMPARISON: 09/28/2023 CLINICAL HISTORY: 141880 SOB (shortness of breath) 141880. sob FINDINGS: LUNGS AND PLEURA: Increased interstitial markings, left greater than right, favoring atypical infection / pneumonia over interstitial edema. No pleural effusion. No pneumothorax. HEART AND MEDIASTINUM: No acute abnormality of the cardiac and mediastinal silhouettes. BONES AND SOFT TISSUES: Degenerative changes of the bilateral shoulders. No acute osseous abnormality. IMPRESSION: 1. Increased interstitial markings, left greater than right, favoring atypical infection/pneumonia over interstitial edema. Electronically signed by: Pinkie Pebbles MD 10/06/2023 08:58 PM EDT RP Workstation:  [id number]   CT FEMUR RIGHT WO CONTRAST Result Date: 10/03/2023 CLINICAL DATA:  Right thigh fluid collection, drain maintenance EXAM: CT OF THE LOWER RIGHT EXTREMITY WITHOUT CONTRAST TECHNIQUE: Multidetector CT imaging of the right lower extremity was performed according to the standard protocol. RADIATION DOSE REDUCTION: This exam was performed according to the departmental dose-optimization program which includes automated exposure control, adjustment of the mA and/or kV according to patient size and/or use of iterative reconstruction technique. COMPARISON:  09/24/2023 FINDINGS: Bones/Joint/Cartilage Stable scattered bone lucencies, see description from the 09/24/2023 exam. Stable right hip arthropathy. Prominent osteoarthritis of the right knee. Left total knee prosthesis partially observed. Large spur like projections from the inferior patella extending into the joint. Moderate knee effusion possibly with synovitis. Suspected free osteochondral fragments in the knee joint. Ligaments N/A Muscles and Tendons Semimembranosus muscular atrophy bilaterally. Soft tissues Reduced size of the fluid collection along the superficial fascia margin of the tensor fascia lata and rectus femoris, currently measuring about 20.9 by 3.0 by 11.8 cm (volume = 390 cm^3). Previous total volume cannot be calculated as this was not completely included, but previous total volume was probably about 3 times is much. Surrounding subcutaneous edema along the right lateral thigh tracking down to the knee. There is also some subcutaneous edema medially in both thighs. Edema tracks along the superficial fascia margin of the posterior calf musculature in the upper calf region. Prominence of stool in visualized colon, cannot exclude constipation. Atherosclerosis noted. IMPRESSION: 1. Reduced size of the fluid collection along the superficial fascia margin of the tensor fascia lata and rectus femoris, currently measuring about 20.9 by 3.0 by 11.8  cm (volume = 390 cc). Previous total volume cannot be calculated as this was not completely included, but previous total volume was probably about 3 times is much. 2. Surrounding subcutaneous edema along the right lateral thigh tracking down to the knee. There is also some subcutaneous  edema medially in both thighs. Edema tracks along the superficial fascia margin of the posterior calf musculature in the upper calf region. 3. Prominent osteoarthritis of the right knee. Moderate knee effusion possibly with synovitis. Suspected free osteochondral fragments in the knee joint. 4. Prominence of stool in visualized colon, cannot exclude constipation. 5. Atherosclerosis. 6. Stable scattered bone lucencies, see description from the 09/24/2023 exam. Electronically Signed   By: Ryan Salvage M.D.   On: 10/03/2023 08:21   VAS US  LOWER EXTREMITY VENOUS (DVT) Result Date: 10/01/2023  Lower Venous DVT Study Patient Name:  Dallon Dacosta.  Date of Exam:   10/01/2023 Medical Rec #: [id number]           Accession #:    [id number] Date of Birth: [birth year]           Patient Gender: M Patient Age:   57 years Exam Location:  [facility] Procedure:      VAS US  LOWER EXTREMITY VENOUS (DVT) Referring Phys: DELILIAH RASHID --------------------------------------------------------------------------------  Indications: Right thigh swelling, erythema, pain.  Risk Factors: Extended hospitalization with immobility (3 months). Limitations: Poor ultrasound/tissue interface. Comparison Study: LLEV on 07/18/2023 was negative for DVT. Performing Technologist: Ezzie Potters RVT, RDMS  Examination Guidelines: A complete evaluation includes B-mode imaging, spectral Doppler, color Doppler, and power Doppler as needed of all accessible portions of each vessel. Bilateral testing is considered an integral part of a complete examination. Limited examinations for reoccurring indications may be performed as noted. The reflux portion of the exam is  performed with the patient in reverse Trendelenburg.  +---------+---------------+---------+-----------+----------+-------------------+ RIGHT    CompressibilityPhasicitySpontaneityPropertiesThrombus Aging      +---------+---------------+---------+-----------+----------+-------------------+ CFV      Full           Yes      Yes                                      +---------+---------------+---------+-----------+----------+-------------------+ SFJ      Full                                                             +---------+---------------+---------+-----------+----------+-------------------+ FV Prox  Full           Yes      Yes                                      +---------+---------------+---------+-----------+----------+-------------------+ FV Mid   Full           Yes      Yes                                      +---------+---------------+---------+-----------+----------+-------------------+ FV DistalFull           Yes      Yes                                      +---------+---------------+---------+-----------+----------+-------------------+ PFV      Full                                                             +---------+---------------+---------+-----------+----------+-------------------+  POP      Full           Yes      Yes                                      +---------+---------------+---------+-----------+----------+-------------------+ PTV      None           No       No                   Age indeterminate                                                         (one of paired)     +---------+---------------+---------+-----------+----------+-------------------+ PERO     Full                                                             +---------+---------------+---------+-----------+----------+-------------------+   +---------+---------------+---------+-----------+----------+--------------+ LEFT      CompressibilityPhasicitySpontaneityPropertiesThrombus Aging +---------+---------------+---------+-----------+----------+--------------+ CFV      None           No       No                   Acute          +---------+---------------+---------+-----------+----------+--------------+ SFJ      Partial                                      Acute          +---------+---------------+---------+-----------+----------+--------------+ FV Prox  Full           Yes      Yes                                 +---------+---------------+---------+-----------+----------+--------------+ FV Mid   Full           Yes      Yes                                 +---------+---------------+---------+-----------+----------+--------------+ FV DistalFull           Yes      Yes                                 +---------+---------------+---------+-----------+----------+--------------+ PFV      None           No       No                                  +---------+---------------+---------+-----------+----------+--------------+ POP      Full           Yes  Yes                                 +---------+---------------+---------+-----------+----------+--------------+ PTV      Full                                                        +---------+---------------+---------+-----------+----------+--------------+ PERO     Full                                                        +---------+---------------+---------+-----------+----------+--------------+ Proximal CFV compresses, has color, and phasic flow. Mid and distal CFV have occlusive thrombus    Summary: BILATERAL: -No evidence of popliteal cyst, bilaterally. RIGHT: - Findings consistent with age indeterminate deep vein thrombosis involving the right posterior tibial veins.   LEFT: - Findings consistent with acute deep vein thrombosis involving the left common femoral vein, SF junction, and left femoral vein.  - The common  femoral vein obstruction does not appear to extend above inguinal ligament.  *See table(s) above for measurements and observations. Electronically signed by Gaile New MD on 10/01/2023 at 5:11:34 PM.    Final    IR GASTROSTOMY TUBE REMOVAL Result Date: 10/01/2023 INDICATION: Gastrostomy tube is no longer needed. EXAM: GASTROSTOMY TUBE REMOVAL MEDICATIONS: None ANESTHESIA/SEDATION: None CONTRAST:  None FLUOROSCOPY: None COMPLICATIONS: None immediate. PROCEDURE: Informed written consent was obtained from the patient after a thorough discussion of the procedural risks, benefits and alternatives. All questions were addressed. A timeout was performed prior to the initiation of the procedure. Balloon retention 18 French gastrostomy tube was intact. 5 cc syringe was used to remove 5 cc of saline from balloon. Gastrostomy tube was removed without difficulty. Site was cleaned and dressed appropriately. IMPRESSION: Successful removal of 18 French balloon retention gastrostomy tube. Performed by Carlin Griffon, PA-C Electronically Signed   By: Juliene Balder M.D.   On: 10/01/2023 16:40   US  RT LOWER EXTREM LTD SOFT TISSUE NON VASCULAR Result Date: 09/30/2023 CLINICAL DATA:  Evaluate for abscess. EXAM: ULTRASOUND RIGHT LOWER EXTREMITY LIMITED TECHNIQUE: Ultrasound examination of the lower extremity soft tissues was performed in the area of clinical concern. COMPARISON:  None Available. FINDINGS: In the region of palpable abnormality in the lateral aspect of the right thigh there is a well-circumscribed is 8.5 x 2.3 x 6.8 cm fluid collection in the subcutaneous tissues. This is 1.8 cm deep to the skin. There is no abnormal vascularity. There is no soft tissue mass. IMPRESSION: 8.5 cm fluid collection in the subcutaneous tissues of the lateral aspect of the right thigh. This may represent a seroma or abscess. Electronically Signed   By: Greig Pique M.D.   On: 09/30/2023 21:47   DG Knee 1-2 Views Right Result Date:  09/29/2023 CLINICAL DATA:  Knee pain and swelling EXAM: RIGHT KNEE - 1-2 VIEW COMPARISON:  X-ray 01/18/2019 FINDINGS: Moderate joint effusion. Moderate joint space loss of the medial compartment and patellofemoral joint. Tricompartmental osteophytes are seen greatest of the patellofemoral joint. Chondrocalcinosis also identified. There is medial translation of the femur relative to the tibia as well. Preserved bone mineralization.  IMPRESSION: Advanced tricompartmental degenerative changes. Moderate joint effusion. Chondrocalcinosis Electronically Signed   By: Ranell Bring M.D.   On: 09/29/2023 15:57   DG Ribs Unilateral Left Result Date: 09/28/2023 CLINICAL DATA:  Left-sided rib pain after cough EXAM: LEFT RIBS - 2 VIEW COMPARISON:  Chest x-ray 09/28/2023.  Chest CT 07/26/2023. FINDINGS: Expansile anterolateral left 6 rib lesion is again noted. No displaced fractures are identified. Lungs are grossly clear. IMPRESSION: Expansile anterolateral left 6 rib lesion is again noted. Correlate for point tenderness. No displaced fractures are identified. Electronically Signed   By: Greig Pique M.D.   On: 09/28/2023 21:50   DG CHEST PORT 1 VIEW Result Date: 09/28/2023 CLINICAL DATA:  Chest pain.  Attention left ribs. EXAM: PORTABLE CHEST 1 VIEW COMPARISON:  08/23/2023, CT 07/26/2023 FINDINGS: Removal of right-sided dialysis catheter and tracheostomy tube. Low lung volumes persist. Ill-defined opacity at the left lung base likely pleural effusion and airspace disease/atelectasis. Stable heart size and mediastinal contours. Vascular congestion. No pneumothorax. Chronic bilateral shoulder arthropathy. Lucent lesions within the thoracic osseous structures on prior CT, including left rib, not well demonstrated by radiograph. IMPRESSION: 1. Ill-defined opacity at the left lung base likely pleural effusion and airspace disease/atelectasis. 2. Vascular congestion. 3. Lucent lesions within the thoracic osseous structures on  prior CT, including left rib, are not well demonstrated by radiograph. Electronically Signed   By: Andrea Gasman M.D.   On: 09/28/2023 16:40   US  IMAGE GUIDED FLUID DRAIN BY CATHETER Result Date: 09/26/2023 CLINICAL DATA:  Large right gluteal fluid collection likely evolving hematoma or potential abscess EXAM: US  IMAGE GUIDED FLUID DRAIN BY CATHETER TECHNIQUE: Ultrasound guidance COMPARISON:  None Available. FINDINGS: With the patient in a supine position, the right gluteal region and hip region were evaluated with ultrasound. A large fluid collection corresponding with previous visualized fluid collection is again seen. The patient was prepped and draped in usual sterile fashion. Local anesthesia was achieved by infiltrating subcutaneous tissue with 1% lidocaine . A small incision was made and then a 7 cm Yueh needle was advanced under ultrasound guidance from the incision to midpoint of the fluid collection. The needle was removed and short Amplatz wire was advanced under ultrasound guidance and coiled within the fluid collection. The Yueh catheter was then removed and the access site was dilated with a 10 French fascial dilator. A 10 French pigtail catheter was then advanced over the guidewire and coiled within the fluid collection. Locking mechanism engaged. Retention suture and sterile dressing applied. Small sample was obtained and sent to pathology for aerobic and anaerobe evaluation. IMPRESSION: Satisfactory 10 French drainage catheter placed in a right hip abscess. Fluid appears to be infected hematoma. Electronically Signed   By: Cordella Banner   On: 09/26/2023 10:41   CT HEAD WO CONTRAST ( ) Result Date: 09/25/2023 CLINICAL DATA:  Initial evaluation for acute neuro deficit, stroke suspected. EXAM: CT HEAD WITHOUT CONTRAST TECHNIQUE: Contiguous axial images were obtained from the base of the skull through the vertex without intravenous contrast. RADIATION DOSE REDUCTION: This exam was performed  according to the departmental dose-optimization program which includes automated exposure control, adjustment of the mA and/or kV according to patient size and/or use of iterative reconstruction technique. COMPARISON:  CT from 09/22/2023. FINDINGS: Brain: Cerebral volume within normal limits. Patchy hypodensity involving the supratentorial cerebral white matter, most characteristic of chronic microvascular ischemic disease, moderate in nature. No acute intracranial hemorrhage. No acute large vessel territory infarct. No mass lesion, midline shift or mass  effect. No hydrocephalus or extra-axial fluid collection. Vascular: No abnormal hyperdense vessel. Calcified atherosclerosis present at the skull base. Skull: Scalp soft tissues demonstrate no acute finding. Multiple lucent lesions again noted throughout the calvarium and visualized skull base, consistent with history of multiple myeloma. Sinuses/Orbits: Globes orbital soft tissues within normal limits. Mild mucosal thickening noted about the right maxillary sinus. Minimal pneumatized secretions noted within the right sphenoid sinus. Large right mastoid and middle ear effusion, with small left mastoid effusion. Imaged nasopharynx grossly unremarkable. Other: None. IMPRESSION: 1. No acute intracranial abnormality. 2. Moderate chronic microvascular ischemic disease. 3. Multiple lucent lesions throughout the calvarium and visualized skull base, consistent with history of multiple myeloma. 4. Large right mastoid and middle ear effusion, with small left mastoid effusion. Findings of uncertain significance, however, correlation with physical exam for possible acute otomastoiditis recommended. Electronically Signed   By: Morene Hoard M.D.   On: 09/25/2023 20:46   CT HIP RIGHT WO CONTRAST Result Date: 09/25/2023 CLINICAL DATA:  Right hip pain EXAM: CT OF THE RIGHT HIP WITHOUT CONTRAST TECHNIQUE: Multidetector CT imaging of the right hip was performed according to  the standard protocol. Multiplanar CT image reconstructions were also generated. RADIATION DOSE REDUCTION: This exam was performed according to the departmental dose-optimization program which includes automated exposure control, adjustment of the mA and/or kV according to patient size and/or use of iterative reconstruction technique. COMPARISON:  CT right femur 08/01/2023 FINDINGS: Bones/Joint/Cartilage Moderate to prominent axial and craniocaudad chondral thinning in the right hip associated with substantial spurring of the right femoral head. Mildly aspherical femoral head morphology may predispose to cam type femoroacetabular impingement. Scattered degenerative subcortical cystic lesions along the right femoral head and acetabulum. Scattered lucencies in the marrow of the visualized bony structures including the right iliac bone, ischium, and right proximal femur. Entities such as myeloma are not excluded. Similar lesions have been reported in other bony structures example in cranial structures on the MRI head from 07/26/2023. Ligaments Suboptimally assessed by CT. Muscles and Tendons Moderate regional muscular atrophy. Soft tissues Extending partially along the superficial fascia margin of the gluteus medius and rectus femoris muscle, there is a large fluid collection which extends above and below the margins of today's CT scan. This measures 12.9 by 6.3 cm in cross-section on image 93 of series 5 and was also present on 08/01/2023, tracking cephalad on that exam all the way up to the level of the iliac crests. Appearance favors chronic hematoma or possibly a Morel-Lavalle lesion given the lack of resolution. Right iliac artery atherosclerosis. IMPRESSION: 1. 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 Mildly aspherical femoral head morphology may predispose to cam type femoroacetabular impingement. 2. Scattered lucencies in the marrow of the visualized bony structures including the right iliac bone,  ischium, and right proximal femur. Entities such as myeloma are not excluded. Similar lesions have been reported in other bony structures example in cranial structures on the MRI head from 07/26/2023. 3. Large fluid collection extending partially along the superficial fascia margin of the gluteus medius and rectus femoris muscle, also present on 08/01/2023, tracking cephalad on that exam all the way up to the level of the iliac crests. Appearance favors chronic hematoma or possibly a Morel-Lavallee lesion given the lack of resolution. 4. Moderate regional muscular atrophy. 5. Right iliac artery atherosclerosis. Electronically Signed   By: Ryan Salvage M.D.   On: 09/25/2023 08:00   CT HEAD WO CONTRAST ( ) Result Date: 09/23/2023 EXAM: CT HEAD WITHOUT  CONTRAST 09/22/2023 09:56:16 PM TECHNIQUE: CT of the head was performed without the administration of intravenous contrast. Automated exposure control, iterative reconstruction, and/or weight based adjustment of the mA/kV was utilized to reduce the radiation dose to as low as reasonably achievable. COMPARISON: None available. CLINICAL HISTORY: Transient ischemic attack (TIA). FINDINGS: BRAIN AND VENTRICLES: No acute hemorrhage. Gray-white differentiation is preserved. No hydrocephalus. No extra-axial collection. No mass effect or midline shift. Periventricular and scattered subcortical white matter hypoattenuation is moderately advanced for age, stable. Mild generalized atrophy is stable. ORBITS: No acute abnormality. SINUSES: Bilateral mastoid effusions are again noted, right greater than left. Minimal right maxillary sinus disease is again seen. Secretions are present within the right sphenoid sinus. SOFT TISSUES AND SKULL: No acute soft tissue abnormality. No skull fracture. A lytic lesion is again noted within a right greater than left clivus. Multiple smaller lytic lesions are present throughout the calvarium, stable. VASCULATURE: Atherosclerotic  calcifications are present in the cavernous carotid arteries bilaterally. No hyperdense vessel is present. IMPRESSION: 1. No acute intracranial abnormality related to the clinical history of TIA. 2. Moderately advanced periventricular and scattered subcortical white matter hypoattenuation, stable. 3. Mild generalized atrophy, stable. 4. Bilateral mastoid effusions, right greater than left, and minimal right maxillary sinus disease, again seen. Secretions are present within the right sphenoid sinus. 5. Lytic lesion within a right greater than left clivus and multiple smaller lytic lesions throughout the calvarium, stable. Electronically signed by: Lonni Necessary MD 09/23/2023 05:20 AM EDT RP Workstation: [id number]    There are no new results to review at this time.  Previous records (including but not limited to H&P, progress notes, nursing notes, TOC management) were reviewed in assessment of this patient.  Labs: CBC: Recent Labs  Lab 10/18/23 0414 10/19/23 0500 10/20/23 0403 10/21/23 0630 10/22/23 0337  WBC 6.1 6.1 5.3 6.1 9.0  NEUTROABS  --   --   --  3.4 7.0  HGB 9.1* 9.4* 9.5* 9.1* 9.5*  HCT 28.6* 30.0* 30.3* 28.7* 30.2*  MCV 100.0 100.7* 101.3* 100.0 99.7  PLT 261 338 326 369 371   Basic Metabolic Panel: Recent Labs  Lab 10/16/23 0525 10/17/23 0316 10/18/23 0414 10/19/23 0500 10/20/23 0403 10/21/23 0339 10/22/23 0337  NA 128* 131* 129* 130* 133* 134* 132*  K 4.5 4.2 4.3 4.4 4.3 4.3 4.4  CL 96* 99 97* 97* 99 99 98  CO2 21* 22 22 24 25 26 25   GLUCOSE 126* 113* 112* 122* 109* 136* 189*  BUN 38* 39* 38* 38* 42* 45* 44*  CREATININE 1.42* 1.43* 1.30* 1.22 1.32* 1.26* 1.27*  CALCIUM  8.2* 8.4* 8.3* 8.5* 9.1 8.8* 8.3*  PHOS 4.7* 4.3  --   --   --   --   --    Liver Function Tests: Recent Labs  Lab 10/18/23 0414 10/19/23 0500 10/20/23 0403 10/21/23 0339 10/22/23 0337  AST 9* 9* 8* 9* 11*  ALT 6 6 7 6 7   ALKPHOS 58 62 57 62 60  BILITOT 0.4 0.4 0.5 0.5 0.3  PROT  5.4* 5.5* 5.8* 5.4* 5.2*  ALBUMIN  2.3* 2.3* 2.3* 2.2* 2.1*   CBG: Recent Labs  Lab 10/21/23 0757 10/21/23 1142 10/21/23 1829 10/21/23 2011 10/22/23 0758  GLUCAP 158* 110* 179* 230* 167*    Scheduled Meds:  (feeding supplement) PROSource Plus  30 mL Oral TID BM   acyclovir   400 mg Oral BID   apixaban   5 mg Oral BID   collagenase    Topical Daily  docusate sodium   100 mg Oral BID   [START ON 10/23/2023] doxycycline  200mg  in 20 mL SWI irrigation / sclerosing solution for IR  200 mg Irrigation to XRAY   [START ON 10/23/2023] doxycycline  200mg  in 20 mL SWI irrigation / sclerosing solution for IR  200 mg Irrigation to XRAY   epoetin  alfa  40,000 Units Subcutaneous Q Wed-1800   feeding supplement  237 mL Oral BID WC   guaiFENesin   600 mg Oral BID   heparin  lock flush  500 Units Intravenous Once   hydrALAZINE   25 mg Oral Q8H   insulin  aspart  0-15 Units Subcutaneous TID AC & HS   insulin  aspart  2 Units Subcutaneous TID AC & HS   insulin  glargine-yfgn  13 Units Subcutaneous Q24H   lidocaine   1 patch Transdermal Q24H   montelukast   5 mg Oral QHS   multivitamin with minerals  1 tablet Oral Daily   nutrition supplement (JUVEN)  1 packet Oral BID AC   mouth rinse  15 mL Mouth Rinse 4 times per day   pantoprazole   40 mg Oral BID   PARoxetine   40 mg Oral Daily   polyethylene glycol  17 g Oral BID   senna  1 tablet Oral Daily   umeclidinium-vilanterol  1 puff Inhalation Daily   valproic  acid  750 mg Oral BID   verapamil   120 mg Oral Daily   Continuous Infusions:  sodium chloride  Stopped (10/13/23 0952)   sodium chloride  Stopped (08/28/23 0839)   sodium chloride      PRN Meds:.acetaminophen  (TYLENOL ) oral liquid 160 mg/5 mL **OR** acetaminophen , alum & mag hydroxide-simeth, benzonatate , cyclobenzaprine , HYDROmorphone  (DILAUDID ) injection, hydrOXYzine , ipratropium-albuterol , metoCLOPramide  **OR** metoCLOPramide  (REGLAN ) injection, ondansetron  (ZOFRAN ) IV, oxyCODONE , polyethylene glycol,  artificial tears, sodium chloride  flush  Family Communication: None at bedside  Disposition: Status is: Inpatient Remains inpatient appropriate because: See above     Time spent: 50 minutes  Length of inpatient stay: 111 days  Author: Carliss LELON Canales, DO 10/22/2023 11:44 AM  For on call review [email or web address].

## 2023-10-22 NOTE — Hospital Course (Addendum)
 58 year old PMH OSA CPAP, pathological fracture and multiple ORIF left Humerus likely due to multiple myeloma, A-fib not on anticoagulation, hyperlipidemia, tobacco use disorder who was feeling weak for 1 week, admitted to [facility] health with concern of sepsis, community-acquired pneumonia and acute encephalopathy. Patient was found to have lytic lesions, he was transferred to [facility] for oncology treatment. In the meantime he developed encephalopathy, acute renal failure. He remained in the ICU for 36 days. Underwent tracheostomy. He was also started on HD after CRRT,off of HD now. Subsequently PEG tube placed 5/29 and IR drain placement of right thigh hip infected hematoma on 7/11 . S/p PEG tube removal. Needs Port-A-Cath placement (for outpatient chemo), either during this hospitalization or as an outpatient. F/u IR recommendations for drain management. Started on heparin  drip on 7/17 because of b/l LE DVT.  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 on 7/28.  Surgical EBL 750 cc. Oncology and IR following.f   Now most issues resolved and awaiting placement in [place].     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  Wound VAC removed 8/1.  Dressing changes, Eliquis  per Ortho.  Some increased redness evaluated today 8/11 by [name].  Initiating on po doxy empirically.  No fever, discharge, leukocytosis.    Infected right flank/gluteal hematoma - S/p IR drain placement 7/11.  Drain exchanged 7/25.  Completed antibiotic course 7/26.  IR following, possible drain removal in the next day or 2, just before discharge.   Multiple myeloma - Port-A-Cath placed 7/30 by IR.  Started chemo with Velcade /Cytoxan  on 08/21/2023. Chemo changed to Sarclisa /Kyprolis /Cytoxan  later on.  Managed closely by oncology.  Oncology and TOC to work on setting up outpatient chemotherapy in  [place] upon discharge.  Will receive chemotherapy today.     Concern for sepsis - Completed vancomycin  6/28.  Completed Zyvox  7/26.  Resolved.   Acute kidney injury on CKD 3A - Secondary to multiple myeloma.  Briefly required HD.  AKI resolved, TDC removed.  Resolved.   Bilateral lower extremity DVT - Eliquis  on board.  Pt will be on life long anticoagulation.   Aspergillosis with pneumonia - Completed voriconazole  8/5.   Acute hypoxic respiratory failure - Trach removed 6/11, decannulated.  Resolved.   Acute metabolic encephalopathy-resolved   Anemia of chronic renal disease - Hemoglobin stable.   Insulin -dependent diabetes mellitus - Continue insulin  sliding scale.   Paroxysmal atrial fibrillation - Rate well-controlled.  Continue verapamil , Eliquis .   Obstructive sleep apnea - CPAP on board.   Dysphagia - Resolved.  PEG tube removed.  On regular diet.   Severe protein calorie malnutrition - Dietary on board.   Goals of care -Working closely with TOC, pt, on disposition planning.  Looking SNF in [place], bed is available and auth approved.  Anticipate DC in 1-2 days.

## 2023-10-22 NOTE — Progress Notes (Signed)
 [name] is doing okay.  He had no problems with the chemotherapy yesterday.  It sounds like he may be headed back up home to [place] to go to rehab.  I do not have any problem with this.  I just want to make sure everything is easiest for him and his family.  As far as any further therapy for his myeloma, this can all be done at the cancer center which I think is in [place].  He has had no nausea or vomiting.  He has had no diarrhea.  He has had no fever.  He has had no bleeding.  He continues on anticoagulation for his thromboembolic disease.  His appetite is quite good.  He is eating pretty much what he would like.  He is doing PT and OT.  He continues to be incredibly motivated.  His labs show sodium 132.  Potassium 4.4.  Blood sugar 189.  BUN 44 creatinine 1.27.  Calcium  8.3 with an albumin  of 2.1.  White cell count is 9.  Hemoglobin 9.5.  Platelet count 371,000.  On his physical exam, his vital signs show temperature 98.1.  Pulse 100.  Blood pressure 160/92.  Head and neck exam shows no ocular or oral lesions.  He has no palpable cervical or supraclavicular lymph nodes.  Lungs are clear bilaterally.  He has good air movement bilaterally.  Cardiac exam regular rate and rhythm.  He has no murmurs.  Abdomen soft.  Bowel sounds are present.  There is no fluid wave.  He has the drain in the right thigh area.  He has no palpable liver or spleen tip.  Extremities shows no clubbing, cyanosis or edema.  He has decent range of motion of his joints.  The and left arm is disarticulated.  The healing continues.  Neurological exam is nonfocal.   [name] is hopefully on track to get back of home soon to rehab or skilled nursing.  I know this to be a lot easier for he and his family if you are closer to home.  Again, we can certainly transfer his care to a cancer center up in [place].  I note that they would be able to take good care of him.  His case has been incredibly complicated.  He has been in  the hospital now for almost 4 months.  However, he is really progressed and I am so happy that he is at the point of being able to be discharged.    Frank Crease, MD  [place] 3:17

## 2023-10-22 NOTE — Progress Notes (Signed)
 Nutrition Follow-up  DOCUMENTATION CODES:   Severe malnutrition in context of acute illness/injury  INTERVENTION:   - Continue Ensure Plus High Protein po BID, each supplement provides 350 kcal and 20 grams of protein  - Continue PROSource Plus 30 mL TID, each supplement provides 100 kcal and 15 grams of protein  - Continue Juven po BID to support wound healing, each packet provides 95 kcal and 2.5 grams of protein  - Continue MVI with minerals daily  NUTRITION DIAGNOSIS:   Severe Malnutrition related to acute illness (prolonged illness, interuptions in feeding) as evidenced by moderate fat depletion, moderate muscle depletion, percent weight loss (9.6% x 1 month).  Ongoing, being addressed via oral nutrition supplements  GOAL:   Patient will meet greater than or equal to 90% of their needs  Progressing  MONITOR:   PO intake, Supplement acceptance, Diet advancement  REASON FOR ASSESSMENT:   Consult Assessment of nutrition requirement/status  ASSESSMENT:   58 y.o. male with PMH of obesity, asthma, OSA on CPAP, GERD, HTN, HLD who presented due to feeling weak with AMS for a few days. Admitted with concern for sepsis, community-acquired pneumonia, encephalopathy as well as concern for multiple myeloma.  4/17 - admit to [facility] 4/21 - SLP evaluation with recommendations for NPO, IR attempted Cortrak but unable to place, CCM MD successfully placed NG tube 4/22 - concern for aspiration overnight, intubated 4/23 - CRRT 5/08 - s/p tracheostomy 5/10 - CRRT discontinued 5/11 - transferred to [facility] 5/12 - iHD initiated  5/29 - PEG placed 5/31 - chemotherapy changed to Sarclisa /Kyprolis /Cytoxan  6/09 - MBS, NPO recommended  6/11 - trach removed 6/13 - BSE, NPO, tunneled HD catheter removed for line holiday 6/16 - tunneled HD replaced,  6/18 - FEES, thin liquids when supervised 6/19 - BSE, clear liquids 6/21 - BSE, clear liquids 6/23 - MBS, clear liquids, pt not  able to fully participate in study due to pain, no HD since 6/16, line pulled (pt showing signs of renal recovery) 6/24 - FEES, thin liquids and puree 6/27 - BSE with recommendation for dysphagia 1 diet and thin liquids 7/10 - pt with R hip/thigh hematoma 7/11 - s/p BSE with recommendation for dysphagia 2 diet with thin liquids 7/14 - s/p BSE with recommendation for dysphagia 3 diet with thin liquids 7/16 - advanced to regular diet per MD (oncology), PEG tube removed 7/28 - L arm amputation through humerus 7/30 - s/p Port-A-Cath placement with IR 8/01 - VAC to LUE removed 8/05 - chemotherapy restarted  Unable to meet with pt in-person due to no availability of appropriate PPE. Attempted to call pt via phone call, but line was busy.  Noted plan for IR to repeat sclerotherapy for infected R flank/gluteal hematoma on 10/23/23.  Pt currently on a Carb Modified diet with excellent PO intake. Pt also appears to be accepting all supplements ordered including PROSource Plus, Juven, and Ensure Plus High Protein. Will continue current nutrition regimen.  Last available weight is from 10/18/23. Pt's weight has fluctuated between 97-109 kg over the last 1 month. Suspect weight fluctuations are due in part to fluid status. Will continue to monitor weight trends.  Meal Completion: 100% x last 8 documented meal completions  Medications reviewed and include: PROSource Plus 30 mL TID between meals, acyclovir , colace, Epogen  injection weekly, Ensure Plus High Protein BID, SSI TID with meals and at bedtime, Novolog  2 units TID with meals and at bedtime, Semglee  13 units daily, MVI with minerals, Juven BID,  protonix , miralax , senna  Labs reviewed: sodium 132, BUN 44, creatinine 1.27, hemoglobin 9.5 CBG's: 167-230 x 24 hours  UOP: 1550 mL x 24 hours R hip JP drain: 30 mL x 24 hours  Diet Order:   Diet Order             Diet NPO time specified Except for: Sips with Meds  Diet effective midnight            Diet Carb Modified Fluid consistency: Thin; Room service appropriate? Yes  Diet effective now                   EDUCATION NEEDS:   Not appropriate for education at this time  Skin:  Skin Assessment: Skin Integrity Issues: Stage II: L buttocks Unstageable: bilateral heels, sacrum Incisions: L arm Other: pressure injury to bilateral buttocks  Last BM:  10/20/23  Height:   Ht Readings from Last 1 Encounters:  10/01/23 6' (1.829 m)    Weight:   Wt Readings from Last 1 Encounters:  10/18/23 102 kg    Ideal Body Weight:  80.9 kg  BMI:  Body mass index is 30.5 kg/m.  Estimated Nutritional Needs:   Kcal:  2400-2700 kcal/d  Protein:  130-150g/d  Fluid:  2.5L/d    Mallie Satchel, MS, RD, LDN Registered Dietitian II Please see AMiON for contact information.

## 2023-10-22 NOTE — Progress Notes (Signed)
 OT Cancellation Note  Patient Details Name: Frank [name]. MRN: [id number] DOB: Feb 27, 1966   Cancelled Treatment:    Reason Eval/Treat Not Completed: Other (comment) (Required PPE not on unit. OT to attempt treatment once PPE is acquired.)  [name] 10/22/2023, 2:29 PM  [name], OTA Acute Rehabilitation Services  Office [phone]

## 2023-10-22 NOTE — Progress Notes (Signed)
 PT Cancellation Note  Patient Details Name: Frank [name]. MRN: [id number] DOB: 1965/11/28   Cancelled Treatment:    Reason Eval/Treat Not Completed: (P) Other (comment) Proper hazardous chemical PPE not on floor. RN to check on availability. PT will follow back as able for treatment.   Frank [name] B. Fleeta Lapidus PT, DPT Acute Rehabilitation Services Please use secure chat or  Call Office [phone]    [name] 10/22/2023, 10:22 AM

## 2023-10-23 ENCOUNTER — Inpatient Hospital Stay (HOSPITAL_COMMUNITY)

## 2023-10-23 DIAGNOSIS — C9000 Multiple myeloma not having achieved remission: Secondary | ICD-10-CM | POA: Diagnosis not present

## 2023-10-23 DIAGNOSIS — A419 Sepsis, unspecified organism: Secondary | ICD-10-CM | POA: Diagnosis not present

## 2023-10-23 DIAGNOSIS — J181 Lobar pneumonia, unspecified organism: Secondary | ICD-10-CM | POA: Diagnosis not present

## 2023-10-23 DIAGNOSIS — R6521 Severe sepsis with septic shock: Secondary | ICD-10-CM | POA: Diagnosis not present

## 2023-10-23 DIAGNOSIS — N179 Acute kidney failure, unspecified: Secondary | ICD-10-CM | POA: Diagnosis not present

## 2023-10-23 HISTORY — PX: IR SCLEROTHERAPY OF A FLUID COLLECTION: IMG6090

## 2023-10-23 HISTORY — PX: IR CATHETER TUBE CHANGE: IMG717

## 2023-10-23 LAB — COMPREHENSIVE METABOLIC PANEL WITH GFR
ALT: 7 U/L (ref 0–44)
AST: 8 U/L — ABNORMAL LOW (ref 15–41)
Albumin: 2.3 g/dL — ABNORMAL LOW (ref 3.5–5.0)
Alkaline Phosphatase: 63 U/L (ref 38–126)
Anion gap: 8 (ref 5–15)
BUN: 49 mg/dL — ABNORMAL HIGH (ref 6–20)
CO2: 24 mmol/L (ref 22–32)
Calcium: 8.2 mg/dL — ABNORMAL LOW (ref 8.9–10.3)
Chloride: 99 mmol/L (ref 98–111)
Creatinine, Ser: 1.26 mg/dL — ABNORMAL HIGH (ref 0.61–1.24)
GFR, Estimated: >60 mL/min (ref >60)
Glucose, Bld: 128 mg/dL — ABNORMAL HIGH (ref 70–99)
Potassium: 4.3 mmol/L (ref 3.5–5.1)
Sodium: 131 mmol/L — ABNORMAL LOW (ref 135–145)
Total Bilirubin: 0.4 mg/dL (ref 0.0–1.2)
Total Protein: 5.4 g/dL — ABNORMAL LOW (ref 6.5–8.1)

## 2023-10-23 LAB — CBC WITH DIFFERENTIAL/PLATELET
Abs Immature Granulocytes: 0.08 K/uL — ABNORMAL HIGH (ref 0.00–0.07)
Basophils Absolute: 0 K/uL (ref 0.0–0.1)
Basophils Relative: 0 %
Eosinophils Absolute: 0.1 K/uL (ref 0.0–0.5)
Eosinophils Relative: 1 %
HCT: 31.3 % — ABNORMAL LOW (ref 39.0–52.0)
Hemoglobin: 9.5 g/dL — ABNORMAL LOW (ref 13.0–17.0)
Immature Granulocytes: 1 %
Lymphocytes Relative: 17 %
Lymphs Abs: 1.1 K/uL (ref 0.7–4.0)
MCH: 30.4 pg (ref 26.0–34.0)
MCHC: 30.4 g/dL (ref 30.0–36.0)
MCV: 100.3 fL — ABNORMAL HIGH (ref 80.0–100.0)
Monocytes Absolute: 1.2 K/uL — ABNORMAL HIGH (ref 0.1–1.0)
Monocytes Relative: 18 %
Neutro Abs: 4 K/uL (ref 1.7–7.7)
Neutrophils Relative %: 63 %
Platelets: 338 K/uL (ref 150–400)
RBC: 3.12 MIL/uL — ABNORMAL LOW (ref 4.22–5.81)
RDW: 18.6 % — ABNORMAL HIGH (ref 11.5–15.5)
WBC: 6.5 K/uL (ref 4.0–10.5)
nRBC: 0 % (ref 0.0–0.2)

## 2023-10-23 LAB — GLUCOSE, CAPILLARY
Glucose-Capillary: 89 mg/dL (ref 70–99)
Glucose-Capillary: 204 mg/dL — ABNORMAL HIGH (ref 70–99)
Glucose-Capillary: 170 mg/dL — ABNORMAL HIGH (ref 70–99)
Glucose-Capillary: 149 mg/dL — ABNORMAL HIGH (ref 70–99)

## 2023-10-23 LAB — MAGNESIUM: Magnesium: 1.9 mg/dL (ref 1.7–2.4)

## 2023-10-23 MED ORDER — IOHEXOL 300 MG/ML  SOLN
50.0000 mL | Freq: Once | INTRAMUSCULAR | Status: AC | PRN
  Administered 2023-10-23: 25 mL

## 2023-10-23 MED ORDER — MIDAZOLAM HCL 2 MG/2ML IJ SOLN
INTRAMUSCULAR | Status: AC
  Filled 2023-10-23: qty 2

## 2023-10-23 MED ORDER — FENTANYL CITRATE (PF) 100 MCG/2ML IJ SOLN
INTRAMUSCULAR | Status: AC | PRN
  Administered 2023-10-23 (×3): 50 ug via INTRAVENOUS

## 2023-10-23 MED ORDER — FENTANYL CITRATE (PF) 100 MCG/2ML IJ SOLN
INTRAMUSCULAR | Status: AC
  Filled 2023-10-23: qty 2

## 2023-10-23 MED ORDER — LIDOCAINE-EPINEPHRINE 1 %-1:100000 IJ SOLN
20.0000 mL | Freq: Once | INTRAMUSCULAR | Status: AC
  Administered 2023-10-23: 10 mL
  Filled 2023-10-23: qty 20

## 2023-10-23 MED ORDER — MIDAZOLAM HCL 2 MG/2ML IJ SOLN
INTRAMUSCULAR | Status: AC | PRN
Start: 2023-10-23 — End: 2023-10-23
  Administered 2023-10-23: 1 mg via INTRAVENOUS
  Administered 2023-10-23 (×2): .5 mg via INTRAVENOUS

## 2023-10-23 NOTE — TOC Progression Note (Signed)
 Transition of Care (TOC) - Progression Note    Patient Details  Name: Frank [name]. MRN: [id number] Date of Birth: 04-01-1965  Transition of Care [facility]) CM/SW Contact  Luann SHAUNNA [place], [place] Phone Number: 10/23/2023, 1:17 PM  Clinical Narrative:     [facility] explained they cannot offer bed. CSW met with pt and spouse bedside and updated them. Provided bed offer list with medicare star ratings of 14 SNF's that can offer a bed. They are not interested in any of these facilities though spouse does say she will look into [place].  Alternatively they would be interested in [facility](they did not offer) or [facility](referral not sent). Spouse also states they would be interested in [place] or [facility] snfs.   Spouse also stated they were told by a doctor that pt could stay in hospital until his chemo is completed. CSW messaged with attending and oncology. Informed that pt can do chemo outpatient and that pt is medically stable to DC to a SNF.   CSW faxed referrals to the following facilities:  [facility]- fax#[phone] [facility] and [place]- fax#[phone] [facility]- fax# [phone]  1425: CSW called pt's spouse and updated her regarding new referrals that were sent. CSW also explained that if these facilities are unable to accept pt, we would need a choice from the list of facilities provided that have offered a SNF bed. Explained that pt is medically ready.      Expected Discharge Plan: Skilled Nursing Facility Barriers to Discharge: Continued Medical Work up, English as a second language teacher, Other (must enter comment) (SNF pending bed decision)               Expected Discharge Plan and Services In-house Referral: Clinical Social Work Discharge Planning Services: CM Consult Post Acute Care Choice: Skilled Nursing Facility Living arrangements for the past 2 months: Single Family Home                 DME Arranged: N/A DME Agency: NA       HH  Arranged: NA HH Agency: NA         Social Drivers of Health (SDOH) Interventions SDOH Screenings   Food Insecurity: No Food Insecurity (07/05/2023)  Housing: Low Risk  (07/05/2023)  Transportation Needs: No Transportation Needs (07/05/2023)  Utilities: Not At Risk (07/05/2023)  Tobacco Use: High Risk (10/13/2023)    Readmission Risk Interventions     No data to display

## 2023-10-23 NOTE — Progress Notes (Signed)
 Physical Therapy Treatment Patient Details Name: Frank [name]. MRN: [id number] DOB: November 06, 1965 Today's Date: 10/23/2023   History of Present Illness 58 y.o. male transferred from [facility] 07/03/23 to [facility] for management of newly diagnosed plasma cell myeloma with weakness and AMS. Pt also with AKI and metabolic encephalopathy. 5/10 transfer to [facility] for iHD. 4/22 Intubated and chemo initiated. 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 5/6 IVIG started. 5/8-6/11 trach. 5/12 iHD initiated. 5/29 PEG placed. 6/5 chemo initiated. PMHx:Lt THA, carpal tunnel syndrome, Lt TKA, Lt humerus fx, PAF, HTN, HLD, bipolar disorder, obesity, T2DM    PT Comments  STAR PT/OT Session: Pt had just finished getting cleaned up with OT after using BSC. Act of using the [facility] depletes pt energy reserve. Plan for slideboard transfer to wheelchair deferred to tomorrow. Pt with 3x sit<>stand in St[place] once from low BSC and 2 from [place] pads. Pt to sit up in recliner this afternoon to work on sitting tolerance for outpatient chemotherapy at discharge. Will follow back tomorrow for slideboard transfer training.     If plan is discharge home, recommend the following: Two people to help with walking and/or transfers;Assistance with cooking/housework;Assist for transportation;Two people to help with bathing/dressing/bathroom;Direct supervision/assist for medications management;Direct supervision/assist for financial management;Help with stairs or ramp for entrance;Assistance with feeding   Can travel by private vehicle     No  Equipment Recommendations  Hospital bed;Hoyer lift;Wheelchair (measurements PT);Wheelchair cushion (measurements PT);BSC/3in1       Precautions / Restrictions Precautions Precautions: Fall Recall of Precautions/Restrictions: Impaired Restrictions Weight Bearing Restrictions Per Provider Order: Yes LUE Weight Bearing Per Provider Order: Non weight bearing Other Position/Activity Restrictions: ROM to tolerance      Mobility  Bed Mobility Overal bed mobility: Needs Assistance Bed Mobility: Supine to Sit     Supine to sit: Contact guard, HOB elevated     General bed mobility comments: increased time and CGA to get to EOB, left in recliner at end of session    Transfers Overall transfer level: Needs assistance Equipment used: Ambulation equipment used Transfers: Sit to/from Stand, Bed to chair/wheelchair/BSC Sit to Stand: +2 physical assistance, Max assist, +2 safety/equipment, Total assist           General transfer comment: Stedy utilized for [facility] and recliner transfers with max assist +2 from elevated bed and total assist +2 from lower surface, BSC. Max assist +2 from [employer]: [employer] Overall balance assessment: Needs assistance Sitting-balance support: Feet supported, No upper extremity supported Sitting balance-Leahy Scale: Fair Sitting balance - Comments: EOB with supervision   Standing balance support: Single extremity supported, During functional activity, Reliant on assistive device for balance Standing balance-Leahy Scale: Zero Standing balance comment: reliant on Stedy for support                            Communication Communication Communication: Impaired Factors Affecting Communication: Reduced clarity of speech  Cognition Arousal: Alert Behavior During Therapy: WFL for tasks assessed/performed                             Following commands: Impaired Following commands impaired: Follows one step commands with increased time    Cueing Cueing Techniques: Verbal cues, Tactile cues, Visual cues  General Comments General comments (skin integrity, edema, etc.): VSS on RA, fatigues after use of BSC      Pertinent Vitals/Pain Pain Assessment Pain Assessment: Faces Faces Pain Scale: Hurts little more Pain Location: LUE and right knee Pain Descriptors / Indicators:  Grimacing, Guarding Pain Intervention(s): Monitored during session, Limited activity within patient's tolerance, Repositioned     PT Goals (current goals can now be found in the care plan section) Acute Rehab PT Goals PT Goal Formulation: With patient Time For Goal Achievement: 10/23/23 Potential to Achieve Goals: Fair Progress towards PT goals: Progressing toward goals    Frequency    Min 5X/week       Co-evaluation   Reason for Co-Treatment: For patient/therapist safety;To address functional/ADL transfers PT goals addressed during session: Mobility/safety with mobility;Balance OT goals addressed during session: ADL's and self-care;Strengthening/ROM      AM-PAC PT 6 Clicks Mobility   Outcome Measure  Help needed turning from your back to your side while in a flat bed without using bedrails?: A Lot Help needed moving from lying on your back to sitting on the side of a flat bed without using bedrails?: Total Help needed moving to and from a bed to a chair (including a wheelchair)?: Total Help needed standing up from a chair using your arms (e.g., wheelchair or bedside chair)?: Total Help needed to walk in hospital room?: Total Help needed climbing 3-5 steps with a railing? : Total 6 Click Score: 7    End of Session Equipment Utilized During Treatment: Gait belt Activity Tolerance: Patient limited by fatigue Patient left: in CPM;with call bell/phone within reach Nurse Communication: Mobility status PT Visit Diagnosis: Other abnormalities of gait and mobility (R26.89);Muscle weakness (generalized) (M62.81);Difficulty in walking, not elsewhere classified (R26.2)     Time: 8568-8541 PT Time Calculation (min) (ACUTE ONLY): 27 min  Charges:    $Therapeutic Activity: 23-37 mins PT General Charges $$ ACUTE PT VISIT: 1 Visit                     Crosley Stejskal B. Fleeta Lapidus PT, DPT Acute Rehabilitation Services Please use secure chat or  Call Office [phone]    [name] [facility] 10/23/2023, 3:23 PM

## 2023-10-23 NOTE — Plan of Care (Signed)
 Problem: Education: Goal: Knowledge of General Education information will improve Description: Including pain rating scale, medication(s)/side effects and non-pharmacologic comfort measures Outcome: Progressing   Problem: Health Behavior/Discharge Planning: Goal: Ability to manage health-related needs will improve Outcome: Progressing   Problem: Clinical Measurements: Goal: Ability to maintain clinical measurements within normal limits will improve Outcome: Progressing Goal: Will remain free from infection Outcome: Progressing Goal: Diagnostic test results will improve Outcome: Progressing Goal: Respiratory complications will improve Outcome: Progressing Goal: Cardiovascular complication will be avoided Outcome: Progressing   Problem: Activity: Goal: Risk for activity intolerance will decrease Outcome: Progressing   Problem: Nutrition: Goal: Adequate nutrition will be maintained Outcome: Progressing   Problem: Coping: Goal: Level of anxiety will decrease Outcome: Progressing   Problem: Elimination: Goal: Will not experience complications related to bowel motility Outcome: Progressing Goal: Will not experience complications related to urinary retention Outcome: Progressing   Problem: Pain Managment: Goal: General experience of comfort will improve and/or be controlled Outcome: Progressing   Problem: Safety: Goal: Ability to remain free from injury will improve Outcome: Progressing   Problem: Skin Integrity: Goal: Risk for impaired skin integrity will decrease Outcome: Progressing   Problem: Education: Goal: Ability to describe self-care measures that may prevent or decrease complications (Diabetes Survival Skills Education) will improve Outcome: Progressing Goal: Individualized Educational Video(s) Outcome: Progressing   Problem: Coping: Goal: Ability to adjust to condition or change in health will improve Outcome: Progressing   Problem: Fluid  Volume: Goal: Ability to maintain a balanced intake and output will improve Outcome: Progressing   Problem: Health Behavior/Discharge Planning: Goal: Ability to identify and utilize available resources and services will improve Outcome: Progressing Goal: Ability to manage health-related needs will improve Outcome: Progressing   Problem: Metabolic: Goal: Ability to maintain appropriate glucose levels will improve Outcome: Progressing   Problem: Nutritional: Goal: Maintenance of adequate nutrition will improve Outcome: Progressing Goal: Progress toward achieving an optimal weight will improve Outcome: Progressing   Problem: Skin Integrity: Goal: Risk for impaired skin integrity will decrease Outcome: Progressing   Problem: Tissue Perfusion: Goal: Adequacy of tissue perfusion will improve Outcome: Progressing   Problem: Education: Goal: Knowledge about tracheostomy care/management will improve Outcome: Progressing   Problem: Activity: Goal: Ability to tolerate increased activity will improve Outcome: Progressing   Problem: Health Behavior/Discharge Planning: Goal: Ability to manage tracheostomy will improve Outcome: Progressing   Problem: Education: Goal: Knowledge of General Education information will improve Description: Including pain rating scale, medication(s)/side effects and non-pharmacologic comfort measures Outcome: Progressing   Problem: Health Behavior/Discharge Planning: Goal: Ability to manage health-related needs will improve Outcome: Progressing   Problem: Clinical Measurements: Goal: Ability to maintain clinical measurements within normal limits will improve Outcome: Progressing Goal: Will remain free from infection Outcome: Progressing Goal: Diagnostic test results will improve Outcome: Progressing Goal: Respiratory complications will improve Outcome: Progressing Goal: Cardiovascular complication will be avoided Outcome: Progressing    Problem: Activity: Goal: Risk for activity intolerance will decrease Outcome: Progressing   Problem: Nutrition: Goal: Adequate nutrition will be maintained Outcome: Progressing   Problem: Coping: Goal: Level of anxiety will decrease Outcome: Progressing   Problem: Elimination: Goal: Will not experience complications related to bowel motility Outcome: Progressing Goal: Will not experience complications related to urinary retention Outcome: Progressing   Problem: Pain Managment: Goal: General experience of comfort will improve and/or be controlled Outcome: Progressing   Problem: Safety: Goal: Ability to remain free from injury will improve Outcome: Progressing   Problem: Skin Integrity: Goal: Risk for impaired  skin integrity will decrease Outcome: Progressing

## 2023-10-23 NOTE — Progress Notes (Signed)
 Hopefully, Mr. Rundle will be able to go back up to [place] soon.  I know there are waiting open for a rehab facility up there.  He is doing well.  He has had no problems with the chemotherapy.  He did not do any physical therapy yesterday.  Hopefully, today's drainage catheter will come out of the right hip.  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  He has a good appetite.  He has had no nausea or vomiting.  He has had no diarrhea.  He has had no bleeding.  His labs show sodium 131.  Potassium 4.3.  BUN 49 creatinine 1.26.  Calcium  8.2 with an albumin  of 2.3.  White cell count 6.5.  Hemoglobin 9.5.  Platelet count 338,000.  On his physical exam, his temperature is 98.9.  Pulse 95.  Blood pressure 110/77.  His lungs are clear bilaterally.  He has good air movement bilaterally.  Cardiac exam regular rate and rhythm.  He has no murmurs.  Oral exam does not show any mucositis.  Abdomen is soft.  Bowel sounds are present.  There is no fluid wave.  There is no palpable liver or spleen tip.  Extremity shows no clubbing, cyanosis or edema.  He has a drainage catheter in the right hip area.  Neurological exam is nonfocal.   Mr. Gambale has myeloma.  He has responded very well.  He is to continue therapy.  He had chemotherapy 2 days ago.  So far, he has tolerated this well.  Again, once we know when he is going to be discharged, we can arrange for follow-up up in [place].  I know this has been incredibly difficult.  This has been incredibly complicated.  He has been in the hospital for 4 months.  It would be wonderful if he could go home by the weekend.  At least, he will be closer to where he lives.  Jeralyn Crease, MD  Ila 41:10

## 2023-10-23 NOTE — Procedures (Signed)
 Pre procedural Dx: Right flank fluid collection Post procedural Dx: Same  Fluoro guided sclerosing of fluid collection using doxycycline .  Second attempt.  EBL: Trace Complications: None immediate  KANDICE Banner, MD Pager #: [phone]

## 2023-10-23 NOTE — Progress Notes (Signed)
 Progress Note   Patient: Frank [name]. FMW:[id number] DOB: 12-15-1965 DOA: 07/03/2023  DOS: the patient was seen and examined on 10/23/2023   Brief hospital course:  58 year old PMH OSA CPAP, pathological fracture and multiple ORIF left Humerus likely due to multiple myeloma, A-fib not on anticoagulation, hyperlipidemia, tobacco use disorder who was feeling weak for 1 week, admitted to [facility] health with concern of sepsis, community-acquired pneumonia and acute encephalopathy. Patient was found to have lytic lesions, he was transferred to [facility] for oncology treatment. In the meantime he developed encephalopathy, acute renal failure. He remained in the ICU for 36 days. Underwent tracheostomy. He was also started on HD after CRRT,off of HD now. Subsequently PEG tube placed 5/29 and IR drain placement of right thigh hip infected hematoma on 7/11 . S/p PEG tube removal. Needs Port-A-Cath placement (for outpatient chemo), either during this hospitalization or as an outpatient. F/u IR recommendations for drain management. Started on heparin  drip on 7/17 because of b/l LE DVT.  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 on 7/28.  Surgical EBL 750 cc. Oncology and IR following.  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  Wound VAC removed 8/1.  Dressing changes, Eliquis  per Ortho.  Infected right flank/gluteal hematoma - S/p IR drain placement 7/11.  Drain exchanged 7/25.  Completed antibiotic course 7/26.  IR following, planning repeat sclerotherapy today, possible drain removal.  Concern for sepsis - Completed vancomycin  6/28.  Completed Zyvox  7/26.  Multiple myeloma - Port-A-Cath placed 7/30 by IR.  Started chemo with Velcade /Cytoxan  on 08/21/2023. Chemo changed to Sarclisa /Kyprolis /Cytoxan  later on.  Managed closely by oncology.  Oncology to work on setting up  outpatient chemotherapy in [place] upon discharge.  Acute kidney injury on CKD 3A - Secondary to multiple myeloma.  Briefly required HD.  AKI resolved, TDC removed.  Bilateral lower extremity DVT - Eliquis  on board.  Aspergillosis with pneumonia - Completed voriconazole  8/5.  Acute hypoxic respiratory failure - Trach removed 6/11, decannulated.  Resolved.  Acute metabolic encephalopathy-resolved  Anemia of chronic renal disease - Hemoglobin stable.  Insulin -dependent diabetes mellitus - Continue insulin  sliding scale.  Paroxysmal atrial fibrillation - Rate well-controlled.  Continue verapamil , Eliquis .  Obstructive sleep apnea - CPAP on board.  Dysphagia - Resolved.  PEG tube removed.  On regular diet.  Severe protein calorie malnutrition - Dietary on board.  Goals of care -Working closely with TOC, pt, on disposition planning.  Looking at [facility] in [place], awaiting insurance auth.   Subjective: Patient resting comfortably this morning.  Denies any fever, chills, chest pain, nausea, vomiting, abdominal pain.  Appetite is good this morning.  In good spirits.  Physical Exam:  Vitals:   10/23/23 1000 10/23/23 1005 10/23/23 1010 10/23/23 1100  BP: (!) 126/98 (!) 135/105 (!) 127/99   Pulse: 92 94 90   Resp: (!) 22 (!) 24 (!) 26 20  Temp:      TempSrc:      SpO2: 98% 99% 98%   Weight:      Height:        GENERAL:  Alert, pleasant, no acute distress  HEENT:  EOMI CARDIOVASCULAR:  RRR, no murmurs appreciated RESPIRATORY:  Clear to auscultation, no wheezing, rales, or rhonchi GASTROINTESTINAL:  Soft, nontender, nondistended EXTREMITIES: L UE above elbow amputation, draining right hip NEURO:  No new focal deficits  appreciated SKIN:  No rashes noted PSYCH:  Appropriate mood and affect     Data Reviewed:  Imaging Studies: IR IMAGING GUIDED PORT INSERTION Result Date: 10/15/2023 INDICATION: 58 year old male with history multiple myeloma requiring  central venous access for chemotherapy administration EXAM: IMPLANTED PORT A CATH PLACEMENT WITH ULTRASOUND AND FLUOROSCOPIC GUIDANCE COMPARISON:  None Available. MEDICATIONS: None. ANESTHESIA/SEDATION: Moderate (conscious) sedation was employed during this procedure. A total of Versed  1.5 mg and Fentanyl  75 mcg was administered intravenously. Moderate Sedation Time: 14 minutes. The patient's level of consciousness and vital signs were monitored continuously by radiology nursing throughout the procedure under my direct supervision. CONTRAST:  None FLUOROSCOPY TIME:  Ten mGy reference air kerma COMPLICATIONS: None immediate. PROCEDURE: The procedure, risks, benefits, and alternatives were explained to the patient. Questions regarding the procedure were encouraged and answered. The patient understands and consents to the procedure. The right neck and chest were prepped with chlorhexidine  in a sterile fashion, and a sterile drape was applied covering the operative field. Maximum barrier sterile technique with sterile gowns and gloves were used for the procedure. A timeout was performed prior to the initiation of the procedure. Ultrasound was used to examine the jugular vein which was compressible and free of internal echoes. A skin marker was used to demarcate the planned venotomy and port pocket incision sites. Local anesthesia was provided to these sites and the subcutaneous tunnel track with 1% lidocaine  with 1:100,000 epinephrine . A small incision was created at the jugular access site and blunt dissection was performed of the subcutaneous tissues. Under ultrasound guidance, the jugular vein was accessed with a 21 ga micropuncture needle and an 0.018 wire was inserted to the superior vena cava. Real-time ultrasound guidance was utilized for vascular access including the acquisition of a permanent ultrasound image documenting patency of the accessed vessel. A 5 Fr micopuncture set was then used, through which a  0.035 Rosen wire was passed under fluoroscopic guidance into the inferior vena cava. An 8 Fr dilator was then placed over the wire. A subcutaneous port pocket was then created along the upper chest wall utilizing a combination of sharp and blunt dissection. The pocket was irrigated with sterile saline, packed with gauze, and observed for hemorrhage. A single lumen ISP sized power injectable port was chosen for placement. The 8 Fr catheter was tunneled from the port pocket site to the venotomy incision. The port was placed in the pocket. The external catheter was trimmed to appropriate length. The dilator was exchanged for an 8 Fr peel-away sheath under fluoroscopic guidance. The catheter was then placed through the sheath and the sheath was removed. Final catheter positioning was confirmed and documented with a fluoroscopic spot radiograph. The port was accessed with a Huber needle, aspirated, and flushed with heparinized saline. The deep dermal layer of the port pocket incision was closed with interrupted 3-0 Vicryl suture. Dermabond was then placed over the port pocket and neck incisions. The patient tolerated the procedure well without immediate post procedural complication. FINDINGS: After catheter placement, the tip lies within the superior cavoatrial junction. The catheter aspirates and flushes normally and is ready for immediate use. IMPRESSION: Successful placement of a power injectable Port-A-Cath via the right internal jugular vein. The catheter is ready for immediate use. Ester Sides, MD [facility] Radiology Electronically Signed   By: Ester Sides M.D.   On: 10/15/2023 16:30   DG Humerus Left Result Date: 10/13/2023 CLINICAL DATA:  Left upper extremity amputation through  the humerus. Multiple myeloma. EXAM: LEFT HUMERUS - 2+ VIEW COMPARISON:  09/15/2023 FINDINGS: A single C-arm image demonstrates the recently demonstrated lytic changes in the  distal humerus and associated fixation hardware. IMPRESSION: Single C-arm image of the distal humerus and associated fixation hardware. Electronically Signed   By: Elspeth Bathe M.D.   On: 10/13/2023 11:05   DG C-Arm 1-60 Min-No Report Result Date: 10/13/2023 Fluoroscopy was utilized by the requesting physician.  No radiographic interpretation.   DG C-Arm 1-60 Min-No Report Result Date: 10/13/2023 Fluoroscopy was utilized by the requesting physician.  No radiographic interpretation.   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 Planned sclerosing. EXAM: Sclerosing of abnormal fluid collection COMPARISON:  None Available. MEDICATIONS: 500 mg doxycycline ; intra procedural ANESTHESIA/SEDATION: Moderate (conscious) sedation was employed during this procedure. A total of Versed  2 mg and Fentanyl  100 mcg was administered intravenously by the radiology nurse. Total intra-service moderate Sedation Time: 11 minutes. The patient's level of consciousness and vital signs were monitored continuously by radiology nursing throughout the procedure under my direct supervision. CONTRAST:  10 mL Omnipaque  300-administered into the collecting system(s) FLUOROSCOPY: Radiation Exposure Index (as provided by the fluoroscopic device): 5 mGy Kerma COMPLICATIONS: None immediate. PROCEDURE: Informed written consent was obtained from the patient after a thorough discussion of the procedural risks, benefits and alternatives. All questions were addressed. Maximal Sterile Barrier Technique was utilized including caps, mask, sterile gowns, sterile gloves, sterile drape, hand hygiene and skin antiseptic. A timeout was performed prior to the initiation of the procedure. The indwelling catheter in the right hip region prepped and draped in usual sterile fashion. The indwelling 10 French catheter was aggressively irrigated with normal saline  and flushed. Dilute contrast was then injected under fluoroscopic guidance into the catheter to evaluate if the fluid collection was communicating with vascular or lymphatic structure. Contrast flows freely into the catheter and outlines the fluid collection which is irregular in appearance, however there is no vascular or lymphatic communication identified. The contrast was then mostly withdrawn. The catheter and retention sutures were cut. A new 10 French catheter was then advanced over a guidewire into the fluid collection and repositioned in a more cephalad location. Approximately 500 mg of doxycycline  was reconstituted with 50 mL of normal saline and then approximately 35 mL of this mixture was injected into the 10 French pigtail catheter. The catheter was then capped. Retention suture and sterile dressing were applied. The doxycycline  will dwell within the cavity for approximately 1 hour and then the catheter will be connected to a JP bulb and the patient will be monitored for output. IMPRESSION: Satisfactory exchange of a 10 French right hip drainage catheter with sclerosing using doxycycline  as described above. Catheter will remain to a JP bulb until output has sufficiently diminished. Electronically Signed   By: Cordella Banner   On: 10/10/2023 17:18   CT PELVIS WO CONTRAST Result Date: 10/07/2023 CLINICAL DATA:  Right hip drain evaluation. EXAM: CT PELVIS WITHOUT CONTRAST TECHNIQUE: Multidetector CT imaging of the pelvis was performed following the standard protocol without intravenous contrast. RADIATION DOSE REDUCTION: This exam was performed according to the departmental dose-optimization program which includes automated exposure control, adjustment of the mA and/or kV according to patient size and/or use of iterative reconstruction technique. COMPARISON:  CT abdomen pelvis dated 07/08/2023. FINDINGS: Urinary Tract: The visualized ureters appear unremarkable. Punctate stone noted in the posterior  bladder lumen. The urinary bladder  is otherwise unremarkable. Bowel:  Moderate stool in the colon.  The appendix is normal. Vascular/Lymphatic: Moderate aortoiliac atherosclerotic disease. No pelvic adenopathy. Reproductive: The prostate and seminal vesicles are grossly unremarkable. Other: Right gluteal pigtail drainage catheter. Significant interval decrease in the size of the hematoma in the right gluteal muscle now measuring approximately 4.7 x 9.3 cm. Additional loculated collection in the anterior upper right thigh significantly decreased in size since the prior CT and measures approximately 10 x 17 cm in greatest axial dimensions. Musculoskeletal: Osteopenia with degenerative changes of the spine. Total left hip arthroplasty. Scattered lucent lesions throughout the bones as seen on the prior CT. IMPRESSION: 1. Right gluteal pigtail drainage catheter with significant interval decrease in the size of the hematoma in the right gluteal muscle. 2. Significant interval decrease in the size of the loculated collection in the anterior upper right thigh. 3. Punctate stone in the posterior bladder lumen. 4.  Aortic Atherosclerosis (ICD10-I70.0). Electronically Signed   By: Vanetta Chou M.D.   On: 10/07/2023 16:53   DG CHEST PORT 1 VIEW Result Date: 10/06/2023 EXAM: 1 VIEW XRAY OF THE CHEST 10/06/2023 08:52:00 PM COMPARISON: 09/28/2023 CLINICAL HISTORY: 141880 SOB (shortness of breath) 141880. sob FINDINGS: LUNGS AND PLEURA: Increased interstitial markings, left greater than right, favoring atypical infection / pneumonia over interstitial edema. No pleural effusion. No pneumothorax. HEART AND MEDIASTINUM: No acute abnormality of the cardiac and mediastinal silhouettes. BONES AND SOFT TISSUES: Degenerative changes of the bilateral shoulders. No acute osseous abnormality. IMPRESSION: 1. Increased interstitial markings, left greater than right, favoring atypical infection/pneumonia over interstitial edema.  Electronically signed by: Pinkie Pebbles MD 10/06/2023 08:58 PM EDT RP Workstation: [id number]   CT FEMUR RIGHT WO CONTRAST Result Date: 10/03/2023 CLINICAL DATA:  Right thigh fluid collection, drain maintenance EXAM: CT OF THE LOWER RIGHT EXTREMITY WITHOUT CONTRAST TECHNIQUE: Multidetector CT imaging of the right lower extremity was performed according to the standard protocol. RADIATION DOSE REDUCTION: This exam was performed according to the departmental dose-optimization program which includes automated exposure control, adjustment of the mA and/or kV according to patient size and/or use of iterative reconstruction technique. COMPARISON:  09/24/2023 FINDINGS: Bones/Joint/Cartilage Stable scattered bone lucencies, see description from the 09/24/2023 exam. Stable right hip arthropathy. Prominent osteoarthritis of the right knee. Left total knee prosthesis partially observed. Large spur like projections from the inferior patella extending into the joint. Moderate knee effusion possibly with synovitis. Suspected free osteochondral fragments in the knee joint. Ligaments N/A Muscles and Tendons Semimembranosus muscular atrophy bilaterally. Soft tissues Reduced size of the fluid collection along the superficial fascia margin of the tensor fascia lata and rectus femoris, currently measuring about 20.9 by 3.0 by 11.8 cm (volume = 390 cm^3). Previous total volume cannot be calculated as this was not completely included, but previous total volume was probably about 3 times is much. Surrounding subcutaneous edema along the right lateral thigh tracking down to the knee. There is also some subcutaneous edema medially in both thighs. Edema tracks along the superficial fascia margin of the posterior calf musculature in the upper calf region. Prominence of stool in visualized colon, cannot exclude constipation. Atherosclerosis noted. IMPRESSION: 1. Reduced size of the fluid collection along the superficial fascia margin of the  tensor fascia lata and rectus femoris, currently measuring about 20.9 by 3.0 by 11.8 cm (volume = 390 cc). Previous total volume cannot be calculated as this was not completely included, but previous total volume was probably about 3 times is much. 2. Surrounding subcutaneous  edema along the right lateral thigh tracking down to the knee. There is also some subcutaneous edema medially in both thighs. Edema tracks along the superficial fascia margin of the posterior calf musculature in the upper calf region. 3. Prominent osteoarthritis of the right knee. Moderate knee effusion possibly with synovitis. Suspected free osteochondral fragments in the knee joint. 4. Prominence of stool in visualized colon, cannot exclude constipation. 5. Atherosclerosis. 6. Stable scattered bone lucencies, see description from the 09/24/2023 exam. Electronically Signed   By: Ryan Salvage M.D.   On: 10/03/2023 08:21   VAS US  LOWER EXTREMITY VENOUS (DVT) Result Date: 10/01/2023  Lower Venous DVT Study Patient Name:  Kaushal Vannice.  Date of Exam:   10/01/2023 Medical Rec #: [id number]           Accession #:    [id number] Date of Birth: [birth year]           Patient Gender: M Patient Age:   25 years Exam Location:  [facility] Procedure:      VAS US  LOWER EXTREMITY VENOUS (DVT) Referring Phys: DELILIAH RASHID --------------------------------------------------------------------------------  Indications: Right thigh swelling, erythema, pain.  Risk Factors: Extended hospitalization with immobility (3 months). Limitations: Poor ultrasound/tissue interface. Comparison Study: LLEV on 07/18/2023 was negative for DVT. Performing Technologist: Ezzie Potters RVT, RDMS  Examination Guidelines: A complete evaluation includes B-mode imaging, spectral Doppler, color Doppler, and power Doppler as needed of all accessible portions of each vessel. Bilateral testing is considered an integral part of a complete examination. Limited examinations for  reoccurring indications may be performed as noted. The reflux portion of the exam is performed with the patient in reverse Trendelenburg.  +---------+---------------+---------+-----------+----------+-------------------+ RIGHT    CompressibilityPhasicitySpontaneityPropertiesThrombus Aging      +---------+---------------+---------+-----------+----------+-------------------+ CFV      Full           Yes      Yes                                      +---------+---------------+---------+-----------+----------+-------------------+ SFJ      Full                                                             +---------+---------------+---------+-----------+----------+-------------------+ FV Prox  Full           Yes      Yes                                      +---------+---------------+---------+-----------+----------+-------------------+ FV Mid   Full           Yes      Yes                                      +---------+---------------+---------+-----------+----------+-------------------+ FV DistalFull           Yes      Yes                                      +---------+---------------+---------+-----------+----------+-------------------+  PFV      Full                                                             +---------+---------------+---------+-----------+----------+-------------------+ POP      Full           Yes      Yes                                      +---------+---------------+---------+-----------+----------+-------------------+ PTV      None           No       No                   Age indeterminate                                                         (one of paired)     +---------+---------------+---------+-----------+----------+-------------------+ PERO     Full                                                             +---------+---------------+---------+-----------+----------+-------------------+    +---------+---------------+---------+-----------+----------+--------------+ LEFT     CompressibilityPhasicitySpontaneityPropertiesThrombus Aging +---------+---------------+---------+-----------+----------+--------------+ CFV      None           No       No                   Acute          +---------+---------------+---------+-----------+----------+--------------+ SFJ      Partial                                      Acute          +---------+---------------+---------+-----------+----------+--------------+ FV Prox  Full           Yes      Yes                                 +---------+---------------+---------+-----------+----------+--------------+ FV Mid   Full           Yes      Yes                                 +---------+---------------+---------+-----------+----------+--------------+ FV DistalFull           Yes      Yes                                 +---------+---------------+---------+-----------+----------+--------------+ PFV      None  No       No                                  +---------+---------------+---------+-----------+----------+--------------+ POP      Full           Yes      Yes                                 +---------+---------------+---------+-----------+----------+--------------+ PTV      Full                                                        +---------+---------------+---------+-----------+----------+--------------+ PERO     Full                                                        +---------+---------------+---------+-----------+----------+--------------+ Proximal CFV compresses, has color, and phasic flow. Mid and distal CFV have occlusive thrombus    Summary: BILATERAL: -No evidence of popliteal cyst, bilaterally. RIGHT: - Findings consistent with age indeterminate deep vein thrombosis involving the right posterior tibial veins.   LEFT: - Findings consistent with acute deep vein thrombosis involving  the left common femoral vein, SF junction, and left femoral vein.  - The common femoral vein obstruction does not appear to extend above inguinal ligament.  *See table(s) above for measurements and observations. Electronically signed by Gaile New MD on 10/01/2023 at 5:11:34 PM.    Final    IR GASTROSTOMY TUBE REMOVAL Result Date: 10/01/2023 INDICATION: Gastrostomy tube is no longer needed. EXAM: GASTROSTOMY TUBE REMOVAL MEDICATIONS: None ANESTHESIA/SEDATION: None CONTRAST:  None FLUOROSCOPY: None COMPLICATIONS: None immediate. PROCEDURE: Informed written consent was obtained from the patient after a thorough discussion of the procedural risks, benefits and alternatives. All questions were addressed. A timeout was performed prior to the initiation of the procedure. Balloon retention 18 French gastrostomy tube was intact. 5 cc syringe was used to remove 5 cc of saline from balloon. Gastrostomy tube was removed without difficulty. Site was cleaned and dressed appropriately. IMPRESSION: Successful removal of 18 French balloon retention gastrostomy tube. Performed by Carlin Griffon, PA-C Electronically Signed   By: Juliene Balder M.D.   On: 10/01/2023 16:40   US  RT LOWER EXTREM LTD SOFT TISSUE NON VASCULAR Result Date: 09/30/2023 CLINICAL DATA:  Evaluate for abscess. EXAM: ULTRASOUND RIGHT LOWER EXTREMITY LIMITED TECHNIQUE: Ultrasound examination of the lower extremity soft tissues was performed in the area of clinical concern. COMPARISON:  None Available. FINDINGS: In the region of palpable abnormality in the lateral aspect of the right thigh there is a well-circumscribed is 8.5 x 2.3 x 6.8 cm fluid collection in the subcutaneous tissues. This is 1.8 cm deep to the skin. There is no abnormal vascularity. There is no soft tissue mass. IMPRESSION: 8.5 cm fluid collection in the subcutaneous tissues of the lateral aspect of the right thigh. This may represent a seroma or abscess. Electronically Signed   By: Greig Pique M.D.   On: 09/30/2023 21:47   DG Knee 1-2  Views Right Result Date: 09/29/2023 CLINICAL DATA:  Knee pain and swelling EXAM: RIGHT KNEE - 1-2 VIEW COMPARISON:  X-ray 01/18/2019 FINDINGS: Moderate joint effusion. Moderate joint space loss of the medial compartment and patellofemoral joint. Tricompartmental osteophytes are seen greatest of the patellofemoral joint. Chondrocalcinosis also identified. There is medial translation of the femur relative to the tibia as well. Preserved bone mineralization. IMPRESSION: Advanced tricompartmental degenerative changes. Moderate joint effusion. Chondrocalcinosis Electronically Signed   By: Ranell Bring M.D.   On: 09/29/2023 15:57   DG Ribs Unilateral Left Result Date: 09/28/2023 CLINICAL DATA:  Left-sided rib pain after cough EXAM: LEFT RIBS - 2 VIEW COMPARISON:  Chest x-ray 09/28/2023.  Chest CT 07/26/2023. FINDINGS: Expansile anterolateral left 6 rib lesion is again noted. No displaced fractures are identified. Lungs are grossly clear. IMPRESSION: Expansile anterolateral left 6 rib lesion is again noted. Correlate for point tenderness. No displaced fractures are identified. Electronically Signed   By: Greig Pique M.D.   On: 09/28/2023 21:50   DG CHEST PORT 1 VIEW Result Date: 09/28/2023 CLINICAL DATA:  Chest pain.  Attention left ribs. EXAM: PORTABLE CHEST 1 VIEW COMPARISON:  08/23/2023, CT 07/26/2023 FINDINGS: Removal of right-sided dialysis catheter and tracheostomy tube. Low lung volumes persist. Ill-defined opacity at the left lung base likely pleural effusion and airspace disease/atelectasis. Stable heart size and mediastinal contours. Vascular congestion. No pneumothorax. Chronic bilateral shoulder arthropathy. Lucent lesions within the thoracic osseous structures on prior CT, including left rib, not well demonstrated by radiograph. IMPRESSION: 1. Ill-defined opacity at the left lung base likely pleural effusion and airspace disease/atelectasis. 2.  Vascular congestion. 3. Lucent lesions within the thoracic osseous structures on prior CT, including left rib, are not well demonstrated by radiograph. Electronically Signed   By: Andrea Gasman M.D.   On: 09/28/2023 16:40   US  IMAGE GUIDED FLUID DRAIN BY CATHETER Result Date: 09/26/2023 CLINICAL DATA:  Large right gluteal fluid collection likely evolving hematoma or potential abscess EXAM: US  IMAGE GUIDED FLUID DRAIN BY CATHETER TECHNIQUE: Ultrasound guidance COMPARISON:  None Available. FINDINGS: With the patient in a supine position, the right gluteal region and hip region were evaluated with ultrasound. A large fluid collection corresponding with previous visualized fluid collection is again seen. The patient was prepped and draped in usual sterile fashion. Local anesthesia was achieved by infiltrating subcutaneous tissue with 1% lidocaine . A small incision was made and then a 7 cm Yueh needle was advanced under ultrasound guidance from the incision to midpoint of the fluid collection. The needle was removed and short Amplatz wire was advanced under ultrasound guidance and coiled within the fluid collection. The Yueh catheter was then removed and the access site was dilated with a 10 French fascial dilator. A 10 French pigtail catheter was then advanced over the guidewire and coiled within the fluid collection. Locking mechanism engaged. Retention suture and sterile dressing applied. Small sample was obtained and sent to pathology for aerobic and anaerobe evaluation. IMPRESSION: Satisfactory 10 French drainage catheter placed in a right hip abscess. Fluid appears to be infected hematoma. Electronically Signed   By: Cordella Banner   On: 09/26/2023 10:41   CT HEAD WO CONTRAST ( ) Result Date: 09/25/2023 CLINICAL DATA:  Initial evaluation for acute neuro deficit, stroke suspected. EXAM: CT HEAD WITHOUT CONTRAST TECHNIQUE: Contiguous axial images were obtained from the base of the skull through the  vertex without intravenous contrast. RADIATION DOSE REDUCTION: This exam was performed according to the departmental dose-optimization program which includes automated  exposure control, adjustment of the mA and/or kV according to patient size and/or use of iterative reconstruction technique. COMPARISON:  CT from 09/22/2023. FINDINGS: Brain: Cerebral volume within normal limits. Patchy hypodensity involving the supratentorial cerebral white matter, most characteristic of chronic microvascular ischemic disease, moderate in nature. No acute intracranial hemorrhage. No acute large vessel territory infarct. No mass lesion, midline shift or mass effect. No hydrocephalus or extra-axial fluid collection. Vascular: No abnormal hyperdense vessel. Calcified atherosclerosis present at the skull base. Skull: Scalp soft tissues demonstrate no acute finding. Multiple lucent lesions again noted throughout the calvarium and visualized skull base, consistent with history of multiple myeloma. Sinuses/Orbits: Globes orbital soft tissues within normal limits. Mild mucosal thickening noted about the right maxillary sinus. Minimal pneumatized secretions noted within the right sphenoid sinus. Large right mastoid and middle ear effusion, with small left mastoid effusion. Imaged nasopharynx grossly unremarkable. Other: None. IMPRESSION: 1. No acute intracranial abnormality. 2. Moderate chronic microvascular ischemic disease. 3. Multiple lucent lesions throughout the calvarium and visualized skull base, consistent with history of multiple myeloma. 4. Large right mastoid and middle ear effusion, with small left mastoid effusion. Findings of uncertain significance, however, correlation with physical exam for possible acute otomastoiditis recommended. Electronically Signed   By: Morene Hoard M.D.   On: 09/25/2023 20:46   CT HIP RIGHT WO CONTRAST Result Date: 09/25/2023 CLINICAL DATA:  Right hip pain EXAM: CT OF THE RIGHT HIP WITHOUT  CONTRAST TECHNIQUE: Multidetector CT imaging of the right hip was performed according to the standard protocol. Multiplanar CT image reconstructions were also generated. RADIATION DOSE REDUCTION: This exam was performed according to the departmental dose-optimization program which includes automated exposure control, adjustment of the mA and/or kV according to patient size and/or use of iterative reconstruction technique. COMPARISON:  CT right femur 08/01/2023 FINDINGS: Bones/Joint/Cartilage Moderate to prominent axial and craniocaudad chondral thinning in the right hip associated with substantial spurring of the right femoral head. Mildly aspherical femoral head morphology may predispose to cam type femoroacetabular impingement. Scattered degenerative subcortical cystic lesions along the right femoral head and acetabulum. Scattered lucencies in the marrow of the visualized bony structures including the right iliac bone, ischium, and right proximal femur. Entities such as myeloma are not excluded. Similar lesions have been reported in other bony structures example in cranial structures on the MRI head from 07/26/2023. Ligaments Suboptimally assessed by CT. Muscles and Tendons Moderate regional muscular atrophy. Soft tissues Extending partially along the superficial fascia margin of the gluteus medius and rectus femoris muscle, there is a large fluid collection which extends above and below the margins of today's CT scan. This measures 12.9 by 6.3 cm in cross-section on image 93 of series 5 and was also present on 08/01/2023, tracking cephalad on that exam all the way up to the level of the iliac crests. Appearance favors chronic hematoma or possibly a Morel-Lavalle lesion given the lack of resolution. Right iliac artery atherosclerosis. IMPRESSION: 1. 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 Mildly aspherical femoral head morphology may predispose to cam type femoroacetabular impingement. 2. Scattered  lucencies in the marrow of the visualized bony structures including the right iliac bone, ischium, and right proximal femur. Entities such as myeloma are not excluded. Similar lesions have been reported in other bony structures example in cranial structures on the MRI head from 07/26/2023. 3. Large fluid collection extending partially along the superficial fascia margin of the gluteus medius and rectus femoris muscle, also present on 08/01/2023, tracking cephalad on  that exam all the way up to the level of the iliac crests. Appearance favors chronic hematoma or possibly a Morel-Lavallee lesion given the lack of resolution. 4. Moderate regional muscular atrophy. 5. Right iliac artery atherosclerosis. Electronically Signed   By: Ryan Salvage M.D.   On: 09/25/2023 08:00    There are no new results to review at this time.  Previous records (including but not limited to H&P, progress notes, nursing notes, TOC management) were reviewed in assessment of this patient.  Labs: CBC: Recent Labs  Lab 10/19/23 0500 10/20/23 0403 10/21/23 0630 10/22/23 0337 10/23/23 0310  WBC 6.1 5.3 6.1 9.0 6.5  NEUTROABS  --   --  3.4 7.0 4.0  HGB 9.4* 9.5* 9.1* 9.5* 9.5*  HCT 30.0* 30.3* 28.7* 30.2* 31.3*  MCV 100.7* 101.3* 100.0 99.7 100.3*  PLT 338 326 369 371 338   Basic Metabolic Panel: Recent Labs  Lab 10/17/23 0316 10/18/23 0414 10/19/23 0500 10/20/23 0403 10/21/23 0339 10/22/23 0337 10/23/23 0310  NA 131*   < > 130* 133* 134* 132* 131*  K 4.2   < > 4.4 4.3 4.3 4.4 4.3  CL 99   < > 97* 99 99 98 99  CO2 22   < > 24 25 26 25 24   GLUCOSE 113*   < > 122* 109* 136* 189* 128*  BUN 39*   < > 38* 42* 45* 44* 49*  CREATININE 1.43*   < > 1.22 1.32* 1.26* 1.27* 1.26*  CALCIUM  8.4*   < > 8.5* 9.1 8.8* 8.3* 8.2*  MG  --   --   --   --   --   --  1.9  PHOS 4.3  --   --   --   --   --   --    < > = values in this interval not displayed.   Liver Function Tests: Recent Labs  Lab 10/19/23 0500  10/20/23 0403 10/21/23 0339 10/22/23 0337 10/23/23 0310  AST 9* 8* 9* 11* 8*  ALT 6 7 6 7 7   ALKPHOS 62 57 62 60 63  BILITOT 0.4 0.5 0.5 0.3 0.4  PROT 5.5* 5.8* 5.4* 5.2* 5.4*  ALBUMIN  2.3* 2.3* 2.2* 2.1* 2.3*   CBG: Recent Labs  Lab 10/22/23 0758 10/22/23 1157 10/22/23 1634 10/22/23 1929 10/23/23 0805  GLUCAP 167* 211* 184* 146* 89    Scheduled Meds:  (feeding supplement) PROSource Plus  30 mL Oral TID BM   acyclovir   400 mg Oral BID   apixaban   5 mg Oral BID   collagenase    Topical Daily   docusate sodium   100 mg Oral BID   epoetin  alfa  40,000 Units Subcutaneous Q Wed-1800   feeding supplement  237 mL Oral BID WC   guaiFENesin   600 mg Oral BID   heparin  lock flush  500 Units Intravenous Once   hydrALAZINE   25 mg Oral Q8H   insulin  aspart  0-15 Units Subcutaneous TID AC & HS   insulin  aspart  2 Units Subcutaneous TID AC & HS   insulin  glargine-yfgn  13 Units Subcutaneous Q24H   lidocaine   1 patch Transdermal Q24H   montelukast   5 mg Oral QHS   multivitamin with minerals  1 tablet Oral Daily   nutrition supplement (JUVEN)  1 packet Oral BID AC   mouth rinse  15 mL Mouth Rinse 4 times per day   pantoprazole   40 mg Oral BID   PARoxetine   40 mg Oral Daily  polyethylene glycol  17 g Oral BID   senna  1 tablet Oral Daily   umeclidinium-vilanterol  1 puff Inhalation Daily   valproic  acid  750 mg Oral BID   verapamil   120 mg Oral Daily   Continuous Infusions:  sodium chloride  Stopped (10/13/23 0952)   sodium chloride  Stopped (08/28/23 0839)   sodium chloride      PRN Meds:.acetaminophen  (TYLENOL ) oral liquid 160 mg/5 mL **OR** acetaminophen , alum & mag hydroxide-simeth, benzonatate , cyclobenzaprine , HYDROmorphone  (DILAUDID ) injection, hydrOXYzine , ipratropium-albuterol , metoCLOPramide  **OR** metoCLOPramide  (REGLAN ) injection, ondansetron  (ZOFRAN ) IV, oxyCODONE , polyethylene glycol, artificial tears, sodium chloride  flush  Family Communication: None at  bedside  Disposition: Status is: Inpatient Remains inpatient appropriate because: See above     Time spent: 36 minutes  Length of inpatient stay: 112 days  Author: Carliss LELON Canales, DO 10/23/2023 11:33 AM  For on call review [email or web address].

## 2023-10-23 NOTE — TOC Progression Note (Addendum)
 Transition of Care (TOC) - Progression Note    Patient Details  Name: Frank [name]. MRN: [id number] Date of Birth: 05-07-1965  [facility]) CM/SW Contact  Luann SHAUNNA Cumming, [place] Phone Number: 10/23/2023, 10:20 AM  Clinical Narrative:      CSW called and left voicemail with Samuella Ee requesting return call.   1220: [facility] additional times with no answer.   CSW called Skyler in their admissions direct number; no answer and voicemail box full. CSW sent text message to [facility] requesting response.   Expected Discharge Plan: Skilled Nursing Facility Barriers to Discharge: English as a second language teacher, snf bed               Expected Discharge Plan and Services In-house Referral: Clinical Social Work Discharge Planning Services: CM Consult Post Acute Care Choice: Skilled Nursing Facility Living arrangements for the past 2 months: Single Family Home                 DME Arranged: N/A DME Agency: NA       HH Arranged: NA HH Agency: NA         Social Drivers of Health (SDOH) Interventions SDOH Screenings   Food Insecurity: No Food Insecurity (07/05/2023)  Housing: Low Risk  (07/05/2023)  Transportation Needs: No Transportation Needs (07/05/2023)  Utilities: Not At Risk (07/05/2023)  Tobacco Use: High Risk (10/13/2023)    Readmission Risk Interventions     No data to display

## 2023-10-23 NOTE — Progress Notes (Signed)
 Occupational Therapy Treatment Patient Details Name: Frank [name]. MRN: [id number] DOB: June 01, 1965 Today's Date: 10/23/2023   History of present illness 58 y.o. male transferred from [facility] 07/03/23 to [facility] for management of newly diagnosed plasma cell myeloma with weakness and AMS. Pt also with AKI and metabolic encephalopathy. 5/10 transfer to [facility] for iHD. 4/22 Intubated and chemo initiated. 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 5/6 IVIG started. 5/8-6/11 trach. 5/12 iHD initiated. 5/29 PEG placed. 6/5 chemo initiated. PMHx:Lt THA, carpal tunnel syndrome, Lt TKA, Lt humerus fx, PAF, HTN, HLD, bipolar disorder, obesity, T2DM   OT comments  STAR Patient OT/PT session: Patient making good gains with patient able to get to EOB with CGA from mod assist. Patient performed stand from raised bed wit max assist +2 into stedy and transferred to [facility].  Total assist for toilet hygiene seated on BSC. Patient performed stand from [facility] with total assist +2 due to lower surface.  Sit to stands performed from [facility] pads with education on body mechanics to increase independence with sit to stands. Patient left in recliner at end of session with lift pad placed for maximove transfer back to bed with nursing. Acute OT to continue to follow with STAR program to address established goals.        If plan is discharge home, recommend the following:  Two people to help with walking and/or transfers;Assistance with cooking/housework;Assistance with feeding;Direct supervision/assist for medications management;Direct supervision/assist for financial management;Assist for transportation;Help with stairs or ramp for entrance;A lot of help with bathing/dressing/bathroom   Equipment Recommendations  Other (comment) (TBD)    Recommendations for Other Services      Precautions / Restrictions Precautions Precautions: Fall Recall of Precautions/Restrictions: Impaired Restrictions Weight Bearing Restrictions Per Provider  Order: Yes LUE Weight Bearing Per Provider Order: Non weight bearing Other Position/Activity Restrictions: ROM to tolerance       Mobility Bed Mobility Overal bed mobility: Needs Assistance Bed Mobility: Supine to Sit     Supine to sit: Contact guard, HOB elevated     General bed mobility comments: increased time and CGA to get to EOB, left in recliner at end of session    Transfers Overall transfer level: Needs assistance Equipment used: Ambulation equipment used Transfers: Sit to/from Stand, Bed to chair/wheelchair/BSC Sit to Stand: +2 physical assistance, Max assist, +2 safety/equipment, Total assist           General transfer comment: Stedy utilized for [facility] and recliner transfers with max assist +2 from elevated bed and total assist +2 from lower surface, BSC. Max assist +2 from [employer]: [employer] Overall balance assessment: Needs assistance Sitting-balance support: Feet supported, No upper extremity supported Sitting balance-Leahy Scale: Fair Sitting balance - Comments: EOB with supervision   Standing balance support: Single extremity supported, During functional activity, Reliant on assistive device for balance Standing balance-Leahy Scale: Zero Standing balance comment: reliant on Stedy for support                           ADL either performed or assessed with clinical judgement   ADL Overall ADL's : Needs assistance/impaired     Grooming: Wash/dry face;Set up;Sitting Grooming Details (indicate cue type and reason): on EOB         Upper Body Dressing : Sitting;Moderate assistance Upper Body Dressing Details (indicate cue type and reason): don gown using RUE. Lower Body Dressing: Supervision/safety;Sitting/lateral leans Lower Body  Dressing Details (indicate cue type and reason): to donn slip on shoes Toilet Transfer: Maximal assistance;+2 for physical assistance;BSC/3in1 Toilet Transfer Details (indicate  cue type and reason): transfer to [facility] with Stedy with max assist +2 to stand from EOB and total assist +2 to stand from [facility] Toileting- Clothing Manipulation and Hygiene: Total assistance;Sitting/lateral lean Toileting - Clothing Manipulation Details (indicate cue type and reason): total assist for toilet hygiene while seated on [facility]            Extremity/Trunk Assessment              Vision       Perception     Praxis     Communication Communication Communication: Impaired Factors Affecting Communication: Reduced clarity of speech   Cognition Arousal: Alert Behavior During Therapy: WFL for tasks assessed/performed Cognition: Cognition impaired           Executive functioning impairment (select all impairments): Problem solving                   Following commands: Impaired Following commands impaired: Follows one step commands with increased time      Cueing   Cueing Techniques: Verbal cues, Tactile cues, Visual cues  Exercises      Shoulder Instructions       General Comments VSS on RA    Pertinent Vitals/ Pain       Pain Assessment Pain Assessment: Faces Faces Pain Scale: Hurts little more Pain Location: LUE and right knee Pain Descriptors / Indicators: Grimacing, Guarding Pain Intervention(s): Monitored during session, Limited activity within patient's tolerance, Repositioned  Home Living                                          Prior Functioning/Environment              Frequency  Min 1X/week Environmental education officer Program)        Progress Toward Goals  OT Goals(current goals can now be found in the care plan section)  Progress towards OT goals: Progressing toward goals  Acute Rehab OT Goals Patient Stated Goal: to go to rehab in [place], [place] OT Goal Formulation: With patient Time For Goal Achievement: 10/29/23 Potential to Achieve Goals: Fair ADL Goals Pt Will Perform Grooming: sitting;with set-up (A/E PRN) Pt Will  Perform Upper Body Dressing: sitting;with contact guard assist;with adaptive equipment Pt Will Perform Lower Body Dressing: sitting/lateral leans;sit to/from stand;with mod assist (using one-handed RUE techniques) Pt Will Transfer to Toilet: with contact guard assist;bedside commode;with transfer board Pt/caregiver will Perform Home Exercise Program: Increased ROM;Left upper extremity;With written HEP provided;Independently Additional ADL Goal #1: Pt will complete bed mobility with min A as a precursor to EOB ADLs Additional ADL Goal #2: Pt will demonstrate ability to stand statically for >3 mins to demonstrate improving tolerance in prep for OOB mobility Additional ADL Goal #3: Pt will demonstrate independence with sensory stimulation to LUE to improve phantom symptoms.  Plan      Co-evaluation    PT/OT/SLP Co-Evaluation/Treatment: Yes Reason for Co-Treatment: For patient/therapist safety;To address functional/ADL transfers PT goals addressed during session: Mobility/safety with mobility;Balance OT goals addressed during session: ADL's and self-care;Strengthening/ROM      AM-PAC OT 6 Clicks Daily Activity     Outcome Measure   Help from another person eating meals?: A Little Help from another person taking care of personal grooming?: A  Little Help from another person toileting, which includes using toliet, bedpan, or urinal?: Total Help from another person bathing (including washing, rinsing, drying)?: A Lot Help from another person to put on and taking off regular upper body clothing?: A Lot Help from another person to put on and taking off regular lower body clothing?: A Lot 6 Click Score: 13    End of Session Equipment Utilized During Treatment: Gait belt;Other (comment) Laurent)  OT Visit Diagnosis: Unsteadiness on feet (R26.81);Other abnormalities of gait and mobility (R26.89);Muscle weakness (generalized) (M62.81);Pain Pain - Right/Left: Left Pain - part of body: Shoulder    Activity Tolerance Patient tolerated treatment well   Patient Left in chair;with call bell/phone within reach   Nurse Communication Mobility status;Need for lift equipment        Time: 8596-8541 OT Time Calculation (min): 55 min  Charges: OT General Charges $OT Visit: 1 Visit OT Treatments $Self Care/Home Management : 23-37 mins  [name], OTA Acute Rehabilitation Services  Office [phone]   [name] 10/23/2023, 3:11 PM

## 2023-10-24 ENCOUNTER — Other Ambulatory Visit: Payer: Self-pay

## 2023-10-24 DIAGNOSIS — R6521 Severe sepsis with septic shock: Secondary | ICD-10-CM | POA: Diagnosis not present

## 2023-10-24 DIAGNOSIS — N179 Acute kidney failure, unspecified: Secondary | ICD-10-CM | POA: Diagnosis not present

## 2023-10-24 DIAGNOSIS — C9000 Multiple myeloma not having achieved remission: Secondary | ICD-10-CM | POA: Diagnosis not present

## 2023-10-24 DIAGNOSIS — J181 Lobar pneumonia, unspecified organism: Secondary | ICD-10-CM | POA: Diagnosis not present

## 2023-10-24 DIAGNOSIS — A419 Sepsis, unspecified organism: Secondary | ICD-10-CM | POA: Diagnosis not present

## 2023-10-24 LAB — COMPREHENSIVE METABOLIC PANEL WITH GFR
ALT: 7 U/L (ref 0–44)
AST: 9 U/L — ABNORMAL LOW (ref 15–41)
Albumin: 2.3 g/dL — ABNORMAL LOW (ref 3.5–5.0)
Alkaline Phosphatase: 60 U/L (ref 38–126)
Anion gap: 8 (ref 5–15)
BUN: 46 mg/dL — ABNORMAL HIGH (ref 6–20)
CO2: 24 mmol/L (ref 22–32)
Calcium: 8.5 mg/dL — ABNORMAL LOW (ref 8.9–10.3)
Chloride: 100 mmol/L (ref 98–111)
Creatinine, Ser: 1.24 mg/dL (ref 0.61–1.24)
GFR, Estimated: >60 mL/min (ref >60)
Glucose, Bld: 89 mg/dL (ref 70–99)
Potassium: 4.4 mmol/L (ref 3.5–5.1)
Sodium: 132 mmol/L — ABNORMAL LOW (ref 135–145)
Total Bilirubin: 0.3 mg/dL (ref 0.0–1.2)
Total Protein: 5.2 g/dL — ABNORMAL LOW (ref 6.5–8.1)

## 2023-10-24 LAB — GLUCOSE, CAPILLARY
Glucose-Capillary: 104 mg/dL — ABNORMAL HIGH (ref 70–99)
Glucose-Capillary: 127 mg/dL — ABNORMAL HIGH (ref 70–99)
Glucose-Capillary: 108 mg/dL — ABNORMAL HIGH (ref 70–99)
Glucose-Capillary: 122 mg/dL — ABNORMAL HIGH (ref 70–99)

## 2023-10-24 LAB — CBC WITH DIFFERENTIAL/PLATELET
Abs Immature Granulocytes: 0.04 K/uL (ref 0.00–0.07)
Basophils Absolute: 0 K/uL (ref 0.0–0.1)
Basophils Relative: 0 %
Eosinophils Absolute: 0.1 K/uL (ref 0.0–0.5)
Eosinophils Relative: 2 %
HCT: 31.7 % — ABNORMAL LOW (ref 39.0–52.0)
Hemoglobin: 9.6 g/dL — ABNORMAL LOW (ref 13.0–17.0)
Immature Granulocytes: 1 %
Lymphocytes Relative: 19 %
Lymphs Abs: 1.1 K/uL (ref 0.7–4.0)
MCH: 31.1 pg (ref 26.0–34.0)
MCHC: 30.3 g/dL (ref 30.0–36.0)
MCV: 102.6 fL — ABNORMAL HIGH (ref 80.0–100.0)
Monocytes Absolute: 1.2 K/uL — ABNORMAL HIGH (ref 0.1–1.0)
Monocytes Relative: 21 %
Neutro Abs: 3.4 K/uL (ref 1.7–7.7)
Neutrophils Relative %: 57 %
Platelets: 317 K/uL (ref 150–400)
RBC: 3.09 MIL/uL — ABNORMAL LOW (ref 4.22–5.81)
RDW: 18.7 % — ABNORMAL HIGH (ref 11.5–15.5)
WBC: 5.9 K/uL (ref 4.0–10.5)
nRBC: 0 % (ref 0.0–0.2)

## 2023-10-24 LAB — MAGNESIUM: Magnesium: 1.8 mg/dL (ref 1.7–2.4)

## 2023-10-24 NOTE — Plan of Care (Signed)
 Problem: Education: Goal: Knowledge of General Education information will improve Description: Including pain rating scale, medication(s)/side effects and non-pharmacologic comfort measures Outcome: Progressing   Problem: Health Behavior/Discharge Planning: Goal: Ability to manage health-related needs will improve Outcome: Progressing   Problem: Clinical Measurements: Goal: Ability to maintain clinical measurements within normal limits will improve Outcome: Progressing Goal: Will remain free from infection Outcome: Progressing Goal: Diagnostic test results will improve Outcome: Progressing Goal: Respiratory complications will improve Outcome: Progressing Goal: Cardiovascular complication will be avoided Outcome: Progressing   Problem: Activity: Goal: Risk for activity intolerance will decrease Outcome: Progressing   Problem: Nutrition: Goal: Adequate nutrition will be maintained Outcome: Progressing   Problem: Coping: Goal: Level of anxiety will decrease Outcome: Progressing   Problem: Elimination: Goal: Will not experience complications related to bowel motility Outcome: Progressing Goal: Will not experience complications related to urinary retention Outcome: Progressing   Problem: Pain Managment: Goal: General experience of comfort will improve and/or be controlled Outcome: Progressing   Problem: Safety: Goal: Ability to remain free from injury will improve Outcome: Progressing   Problem: Skin Integrity: Goal: Risk for impaired skin integrity will decrease Outcome: Progressing   Problem: Education: Goal: Ability to describe self-care measures that may prevent or decrease complications (Diabetes Survival Skills Education) will improve Outcome: Progressing Goal: Individualized Educational Video(s) Outcome: Progressing   Problem: Coping: Goal: Ability to adjust to condition or change in health will improve Outcome: Progressing   Problem: Fluid  Volume: Goal: Ability to maintain a balanced intake and output will improve Outcome: Progressing   Problem: Health Behavior/Discharge Planning: Goal: Ability to identify and utilize available resources and services will improve Outcome: Progressing Goal: Ability to manage health-related needs will improve Outcome: Progressing   Problem: Metabolic: Goal: Ability to maintain appropriate glucose levels will improve Outcome: Progressing   Problem: Nutritional: Goal: Maintenance of adequate nutrition will improve Outcome: Progressing Goal: Progress toward achieving an optimal weight will improve Outcome: Progressing   Problem: Skin Integrity: Goal: Risk for impaired skin integrity will decrease Outcome: Progressing   Problem: Tissue Perfusion: Goal: Adequacy of tissue perfusion will improve Outcome: Progressing   Problem: Education: Goal: Knowledge about tracheostomy care/management will improve Outcome: Progressing   Problem: Activity: Goal: Ability to tolerate increased activity will improve Outcome: Progressing   Problem: Health Behavior/Discharge Planning: Goal: Ability to manage tracheostomy will improve Outcome: Progressing   Problem: Education: Goal: Knowledge of General Education information will improve Description: Including pain rating scale, medication(s)/side effects and non-pharmacologic comfort measures Outcome: Progressing   Problem: Health Behavior/Discharge Planning: Goal: Ability to manage health-related needs will improve Outcome: Progressing   Problem: Clinical Measurements: Goal: Ability to maintain clinical measurements within normal limits will improve Outcome: Progressing Goal: Will remain free from infection Outcome: Progressing Goal: Diagnostic test results will improve Outcome: Progressing Goal: Respiratory complications will improve Outcome: Progressing Goal: Cardiovascular complication will be avoided Outcome: Progressing    Problem: Activity: Goal: Risk for activity intolerance will decrease Outcome: Progressing   Problem: Nutrition: Goal: Adequate nutrition will be maintained Outcome: Progressing   Problem: Coping: Goal: Level of anxiety will decrease Outcome: Progressing   Problem: Elimination: Goal: Will not experience complications related to bowel motility Outcome: Progressing Goal: Will not experience complications related to urinary retention Outcome: Progressing   Problem: Pain Managment: Goal: General experience of comfort will improve and/or be controlled Outcome: Progressing   Problem: Safety: Goal: Ability to remain free from injury will improve Outcome: Progressing   Problem: Skin Integrity: Goal: Risk for impaired  skin integrity will decrease Outcome: Progressing

## 2023-10-24 NOTE — Progress Notes (Signed)
 Referring Physician(s): Montano,Emil  Supervising Physician: Vanice Revel  Patient Status:  [facility] - In-pt  Chief Complaint: Right hip fluid collection  Subjective: Patient resting comfortably.  Right hip/flank drain in place.   Dark brown, thin liquid in bulb.  Output decreased overnight.   Allergies: Morphine  and codeine, Shellfish allergy, Betadine  [povidone iodine ], Bupropion, Chlorhexidine , Influenza vaccines, and Metoprolol   Medications: Prior to Admission medications   Medication Sig Start Date End Date Taking? Authorizing Provider  albuterol  (PROVENTIL  HFA;VENTOLIN  HFA) 108 (90 Base) MCG/ACT inhaler Inhale 2 puffs into the lungs every 6 (six) hours as needed for wheezing or shortness of breath.   Yes [provider]  albuterol  (PROVENTIL ) (2.5 MG/3ML) 0.083% nebulizer solution 2.5 mg every 2 (two) hours as needed for wheezing or shortness of breath.   Yes [provider]  aspirin  EC 81 MG tablet Take 81 mg by mouth daily. Swallow whole.   Yes [provider]  busPIRone  (BUSPAR ) 15 MG tablet Take 15 mg by mouth at bedtime.   Yes [provider]  celecoxib  (CELEBREX ) 200 MG capsule Take 200 mg by mouth daily.   Yes [provider]  clonazePAM  (KLONOPIN ) 1 MG tablet Take 1 mg by mouth once as needed for anxiety.   Yes [provider]  cyclobenzaprine  (FLEXERIL ) 5 MG tablet Take 1 tablet (5 mg total) by mouth 3 (three) times daily as needed for muscle spasms. Patient taking differently: Take 5 mg by mouth daily as needed for muscle spasms. 01/30/23  Yes McClung, Sarah A, PA-C  divalproex  (DEPAKOTE ) 500 MG DR tablet Take 500 mg by mouth 2 (two) times daily.   Yes [provider]  EPINEPHrine  0.3 mg/0.3 mL IJ SOAJ injection Inject 0.3 mg into the muscle as needed for anaphylaxis. 08/28/21  Yes [provider]  esomeprazole (NEXIUM) 40 MG capsule Take 40 mg by mouth in the morning.   Yes [provider]  Fluticasone -Salmeterol (ADVAIR) 500-50 MCG/DOSE AEPB Inhale 2 puffs into the lungs in the morning.   Yes [provider]  furosemide  (LASIX ) 40 MG tablet Take 40 mg by mouth daily as needed for fluid or edema.   Yes [provider]  gabapentin  (NEURONTIN ) 300 MG capsule Take 300-600 mg by mouth See admin instructions. 300 mg in the morning, 600 mg at bedtime   Yes [provider]  hydrALAZINE  (APRESOLINE ) 10 MG tablet Take 10 mg by mouth in the morning and at bedtime.   Yes [provider]  hydrochlorothiazide  (HYDRODIURIL ) 25 MG tablet Take 25 mg by mouth daily.   Yes [provider]  lisinopril  (ZESTRIL ) 40 MG tablet Take 40 mg by mouth daily. 03/25/23  Yes [provider]  metFORMIN  (GLUCOPHAGE ) 1000 MG tablet Take 1,000 mg by mouth 2 (two) times daily with a meal.   Yes [provider]  montelukast  (SINGULAIR ) 10 MG tablet Take 10 mg by mouth at bedtime.   Yes [provider]  OVER THE COUNTER MEDICATION Take 400 mg by mouth at bedtime. Burdock root   Yes [provider]  oxyCODONE  (ROXICODONE ) 5 MG immediate release tablet Take 1 tablet (5 mg total) by mouth every 4 (four) hours as needed for severe pain (pain score 7-10). 01/30/23  Yes Danton Lauraine LABOR, PA-C  PARoxetine  (PAXIL ) 40 MG tablet Take 40 mg by mouth every morning.   Yes [provider]  spironolactone  (ALDACTONE ) 25 MG tablet Take 25 mg by mouth at bedtime. 10/07/21  Yes [provider]  tamsulosin  (FLOMAX ) 0.4 MG CAPS capsule Take 0.4 mg by mouth at bedtime. 10/07/21  Yes [provider]  testosterone  cypionate (DEPOTESTOSTERONE CYPIONATE) 200 MG/ML injection Inject 0.5 mLs (100 mg total) into the muscle every 14 (fourteen) days. 09/17/16  Yes Nida, Gebreselassie W, MD  verapamil  (CALAN -SR) 120 MG CR tablet Take 120 mg by mouth daily.   Yes [provider]  Vitamin D , Ergocalciferol , (DRISDOL ) 1.25 MG (50000 UNIT) CAPS  capsule Take 1 capsule (50,000 Units total) by mouth every Thursday. Patient taking differently: Take 50,000 Units by mouth once a week. Tuesday 02/06/23  Yes Danton Lauraine LABOR, PA-C  buPROPion (WELLBUTRIN XL) 150 MG 24 hr tablet Take 150 mg by mouth daily. Patient not taking: Reported on 07/04/2023    [provider]     Vital Signs: BP 104/84 (BP Location: Right Arm)   Pulse 96   Temp 98 F (36.7 C) (Oral)   Resp 20   Ht 6' (1.829 m)   Wt 253 lb 8.5 oz (115 kg)   SpO2 98%   BMI 34.38 kg/m   Physical Exam NAD, alert Abdomen: soft, nontender.  Skin: R lateral/hip drain in place.  Insertion site intact, clean, and dry.  Thin brown fluid in bulb.  Output 205 mL yesterday with only minimal drainage this AM.   Imaging: IR SCLEROTHERAPY OF A FLUID COLLECTION Result Date: 10/23/2023 INDICATION: Right hip fluid collection continued. Previous doxycycline  sclerosis performed 10/10/2023. Output still persisting although diminished. Planned second and final attempted sclerosing fluid collection. EXAM: Drain change MEDICATIONS: The patient is currently admitted to the hospital and receiving intravenous antibiotics. The antibiotics were administered within an appropriate time frame prior to the initiation of the procedure. ANESTHESIA/SEDATION: Moderate (conscious) sedation was employed during this procedure. A total of Versed  2 mg and Fentanyl  150 mcg was administered intravenously by the radiology nurse. Total intra-service moderate Sedation Time: 25 mL Omnipaque  3 minutes. The patient's level of consciousness and vital signs were monitored continuously by radiology nursing throughout the procedure under my direct supervision. COMPLICATIONS: None immediate. PROCEDURE: Informed written consent was obtained from the patient after a thorough discussion of the procedural risks, benefits and alternatives. All questions were addressed. Maximal Sterile Barrier Technique was utilized including caps,  mask, sterile gowns, sterile gloves, sterile drape, hand hygiene and skin antiseptic. A timeout was performed prior to the initiation of the procedure. With the patient in a supine position, external portion of drainage in the right flank were prepped and draped in the usual sterile. Aggressive flushing was normal saline. Significant decrease in overall size the when to previous. When all of flushing began to return mostly clear rest of pocket was evacuated. Short Amplatz wire was then advanced through the catheter and the catheter was completely patient. New 10 catheter was then advanced over the guidewire coiled more medially. Approximately 40 mL of reconstituted doxycycline  was then injected into the catheter and the catheter was capped. Retention suture and sterile dressing applied. The catheter will be reopened to suction approximately 2 hours. That time there will be no more flushing of the catheter. IMPRESSION: Repeat sclerosing right flank cavity using combination fluoroscopy and aggressive flushing with doxycycline . Electronically Signed   By: Cordella Banner   On: 10/23/2023 16:23   IR Catheter Tube Change Result Date: 10/23/2023 INDICATION: Right hip fluid collection continued. Previous doxycycline  sclerosis performed 10/10/2023. Output still persisting although diminished. Planned second and final attempted sclerosing fluid collection. EXAM: Drain change MEDICATIONS: The  patient is currently admitted to the hospital and receiving intravenous antibiotics. The antibiotics were administered within an appropriate time frame prior to the initiation of the procedure. ANESTHESIA/SEDATION: Moderate (conscious) sedation was employed during this procedure. A total of Versed  2 mg and Fentanyl  150 mcg was administered intravenously by the radiology nurse. Total intra-service moderate Sedation Time: 25 mL Omnipaque  3 minutes. The patient's level of consciousness and vital signs were monitored continuously by  radiology nursing throughout the procedure under my direct supervision. COMPLICATIONS: None immediate. PROCEDURE: Informed written consent was obtained from the patient after a thorough discussion of the procedural risks, benefits and alternatives. All questions were addressed. Maximal Sterile Barrier Technique was utilized including caps, mask, sterile gowns, sterile gloves, sterile drape, hand hygiene and skin antiseptic. A timeout was performed prior to the initiation of the procedure. With the patient in a supine position, external portion of drainage in the right flank were prepped and draped in the usual sterile. Aggressive flushing was normal saline. Significant decrease in overall size the when to previous. When all of flushing began to return mostly clear rest of pocket was evacuated. Short Amplatz wire was then advanced through the catheter and the catheter was completely patient. New 10 catheter was then advanced over the guidewire coiled more medially. Approximately 40 mL of reconstituted doxycycline  was then injected into the catheter and the catheter was capped. Retention suture and sterile dressing applied. The catheter will be reopened to suction approximately 2 hours. That time there will be no more flushing of the catheter. IMPRESSION: Repeat sclerosing right flank cavity using combination fluoroscopy and aggressive flushing with doxycycline . Electronically Signed   By: Cordella Banner   On: 10/23/2023 16:23    Labs:  CBC: Recent Labs    10/21/23 0630 10/22/23 0337 10/23/23 0310 10/24/23 0357  WBC 6.1 9.0 6.5 5.9  HGB 9.1* 9.5* 9.5* 9.6*  HCT 28.7* 30.2* 31.3* 31.7*  PLT 369 371 338 317    COAGS: Recent Labs    07/05/23 2227 07/07/23 2030 07/10/23 0411 07/26/23 0423 08/13/23 0908 10/02/23 1456 10/02/23 2305 10/03/23 0719  INR 2.1* 1.9*  --   --  1.0  --   --   --   APTT  --  44*   < > 36  --  43* 39* 52*   < > = values in this interval not displayed.     BMP: Recent Labs    10/21/23 0339 10/22/23 0337 10/23/23 0310 10/24/23 0357  NA 134* 132* 131* 132*  K 4.3 4.4 4.3 4.4  CL 99 98 99 100  CO2 26 25 24 24   GLUCOSE 136* 189* 128* 89  BUN 45* 44* 49* 46*  CALCIUM  8.8* 8.3* 8.2* 8.5*  CREATININE 1.26* 1.27* 1.26* 1.24  GFRNONAA >60 >60 >60 >60    LIVER FUNCTION TESTS: Recent Labs    10/21/23 0339 10/22/23 0337 10/23/23 0310 10/24/23 0357  BILITOT 0.5 0.3 0.4 0.3  AST 9* 11* 8* 9*  ALT 6 7 7 7   ALKPHOS 62 60 63 60  PROT 5.4* 5.2* 5.4* 5.2*  ALBUMIN  2.2* 2.1* 2.3* 2.3*    Assessment and Plan: Right flank fluid collection sp drain placement 7/11 for suspected infected hematoma.  Fortunately, fluid aspirate did not show evidence of infection and without growth was determined to be sterile liquefied hematoma.  Unfortunately, patient has continued with persistent output/drainage with catheter in place for >21 days.   First attempt at sclerotherapy to close hematoma pocket was performed  7/25 but drain with ongoing output of 45-50 mL per day.  Second attempt for sclerotherapy using doxycycline  with longer dwell time performed yesterday 8/7 by Dr. Jenna.  Significant output noted overnight now with only a small amount of brown, rust-colored fluid in bulb this AM.  Will trend output for the next 3-5 days and determine whether drain removal indicated.  Unlikely to proceed with additional sclerotherapy.   IR following.   Electronically Signed: Tyffani Foglesong Sue-Ellen Mariyanna Mucha, PA 10/24/2023, 11:37 AM   I spent a total of 15 Minutes at the the patient's bedside AND on the patient's hospital floor or unit, greater than 50% of which was counseling/coordinating care for right flank fluid collection.

## 2023-10-24 NOTE — Progress Notes (Signed)
 Physical Therapy Treatment Patient Details Name: Frank [name]. MRN: [id number] DOB: January 03, 1966 Today's Date: 10/24/2023   History of Present Illness 58 y.o. male transferred from [facility] 07/03/23 to [facility] for management of newly diagnosed plasma cell myeloma with weakness and AMS. Pt also with AKI and metabolic encephalopathy. 5/10 transfer to [facility] for iHD. 4/22 Intubated and chemo initiated. 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 5/6 IVIG started. 5/8-6/11 trach. 5/12 iHD initiated. 5/29 PEG placed. 6/5 chemo initiated. 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 Pt able to participate in donning shorts to be able to slide on slideboard to and from wheelchair. Prior to transfers worked on L shoulder ROM to improve scapular retraction and cervical ROM. Pt requires maximal cuing for concentration on task at hand for exercise and mobility training. Pt is able to perform slideboard transfers to and from wheelchair with grossly min Ax2 with increased cuing for body mechanics and weightshifts to make it easier to scoot. Once up in the wheelchair pt is able to propel wheelchair with B LE, but fatigues quickly. PT/OT took pt outside for some fresh air and exercise. PT has reviewed and updated goals. PT will continue to follow acutely.    If plan is discharge home, recommend the following: Two people to help with walking and/or transfers;Assistance with cooking/housework;Assist for transportation;Two people to help with bathing/dressing/bathroom;Direct supervision/assist for medications management;Direct supervision/assist for financial management;Help with stairs or ramp for entrance;Assistance with feeding   Can travel by private vehicle     No  Equipment  Recommendations  Hospital bed;Hoyer lift;Wheelchair (measurements PT);Wheelchair cushion (measurements PT);BSC/3in1    Recommendations for Other Services       Precautions / Restrictions Precautions Precautions: Fall Recall of Precautions/Restrictions: Impaired Restrictions Weight Bearing Restrictions Per Provider Order: Yes LUE Weight Bearing Per Provider Order: Non weight bearing Other Position/Activity Restrictions: ROM to tolerance     Mobility  Bed Mobility Overal bed mobility: Needs Assistance Bed Mobility: Supine to Sit Rolling: Supervision   Supine to sit: HOB elevated, Min assist, Used rails Sit to supine: Mod assist   General bed mobility comments: cuing for rolling L and R for pulling up shorts in bed, min A for use of bed pad with hip scooting modA for returning LE to bed at end of session    Transfers Overall transfer level: Needs assistance   Transfers: Bed to chair/wheelchair/BSC            Lateral/Scoot Transfers: Min assist, With slide board, +2 physical assistance, From elevated surface General transfer comment: maximal cuing for sequencing use of slide board, body mechanics, and weighshifting to be able to slide to and from wheelchair, min Ax2 for physical assist      Wheelchair Mobility Wheelchair Mobility Wheelchair mobility: Yes Wheelchair propulsion: Both lower extermities Wheelchair parts: Needs assistance Distance: 30 Wheelchair Assistance Details (indicate cue type and reason): able to propel himself with Crocs on his feet   Tilt Bed    Modified Rankin (Stroke Patients Only)       Balance Overall balance assessment: Needs assistance Sitting-balance support: Feet supported, No upper extremity supported Sitting balance-Leahy Scale: Fair Sitting balance - Comments: with transfer to and from wheelchair  Communication Communication Communication: Impaired Factors Affecting  Communication: Reduced clarity of speech  Cognition Arousal: Alert Behavior During Therapy: WFL for tasks assessed/performed                             Following commands: Impaired Following commands impaired: Follows one step commands with increased time (needs increased cuing to not talk and concentrate on command completion)    Cueing Cueing Techniques: Verbal cues, Tactile cues, Visual cues  Exercises Other Exercises Other Exercises: scapular retraction Other Exercises: L scapular depression, Other Exercises: cervical R lateral lean    General Comments General comments (skin integrity, edema, etc.): educated pt on weightshifting for reduced risk of pressure injury, need for elevation of LE when they turn purple,      Pertinent Vitals/Pain Pain Assessment Pain Assessment: Faces Faces Pain Scale: Hurts little more Pain Location: LUE and right knee Pain Descriptors / Indicators: Grimacing, Guarding Pain Intervention(s): Limited activity within patient's tolerance, Monitored during session, Repositioned    Home Living                          Prior Function            PT Goals (current goals can now be found in the care plan section) Acute Rehab PT Goals PT Goal Formulation: With patient Time For Goal Achievement: 11/06/23 Potential to Achieve Goals: Fair Progress towards PT goals: Progressing toward goals    Frequency    Min 5X/week           Co-evaluation PT/OT/SLP Co-Evaluation/Treatment: Yes Reason for Co-Treatment: For patient/therapist safety;To address functional/ADL transfers PT goals addressed during session: Mobility/safety with mobility;Balance OT goals addressed during session: ADL's and self-care;Strengthening/ROM      AM-PAC PT 6 Clicks Mobility   Outcome Measure  Help needed turning from your back to your side while in a flat bed without using bedrails?: A Lot Help needed moving from lying on your back to sitting  on the side of a flat bed without using bedrails?: Total Help needed moving to and from a bed to a chair (including a wheelchair)?: Total Help needed standing up from a chair using your arms (e.g., wheelchair or bedside chair)?: Total Help needed to walk in hospital room?: Total Help needed climbing 3-5 steps with a railing? : Total 6 Click Score: 7    End of Session Equipment Utilized During Treatment: Gait belt Activity Tolerance: Patient tolerated treatment well Patient left: with call bell/phone within reach;in bed Nurse Communication: Mobility status PT Visit Diagnosis: Other abnormalities of gait and mobility (R26.89);Muscle weakness (generalized) (M62.81);Difficulty in walking, not elsewhere classified (R26.2)     Time: 8948-8854 PT Time Calculation (min) (ACUTE ONLY): 54 min  Charges:    $Therapeutic Activity: 8-22 mins $Wheel Chair Management: 8-22 mins PT General Charges $$ ACUTE PT VISIT: 1 Visit                     Isidor Bromell B. Fleeta Lapidus PT, DPT Acute Rehabilitation Services Please use secure chat or  Call Office [phone]    [name] [facility] 10/24/2023, 12:56 PM

## 2023-10-24 NOTE — Progress Notes (Incomplete)
 Voriconazole  EOT 8/5

## 2023-10-24 NOTE — Progress Notes (Signed)
 Progress Note   Patient: Frank [name]. FMW:[id number] DOB: March 17, 1966 DOA: 07/03/2023  DOS: the patient was seen and examined on 10/24/2023   Brief hospital course:  58 year old PMH OSA CPAP, pathological fracture and multiple ORIF left Humerus likely due to multiple myeloma, A-fib not on anticoagulation, hyperlipidemia, tobacco use disorder who was feeling weak for 1 week, admitted to [facility] health with concern of sepsis, community-acquired pneumonia and acute encephalopathy. Patient was found to have lytic lesions, he was transferred to [facility] for oncology treatment. In the meantime he developed encephalopathy, acute renal failure. He remained in the ICU for 36 days. Underwent tracheostomy. He was also started on HD after CRRT,off of HD now. Subsequently PEG tube placed 5/29 and IR drain placement of right thigh hip infected hematoma on 7/11 . S/p PEG tube removal. Needs Port-A-Cath placement (for outpatient chemo), either during this hospitalization or as an outpatient. F/u IR recommendations for drain management. Started on heparin  drip on 7/17 because of b/l LE DVT.  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 on 7/28.  Surgical EBL 750 cc. Oncology and IR following.  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  Wound VAC removed 8/1.  Dressing changes, Eliquis  per Ortho.  Infected right flank/gluteal hematoma - S/p IR drain placement 7/11.  Drain exchanged 7/25.  Completed antibiotic course 7/26.  IR following, planning repeat sclerotherapy today, possible drain removal.  Concern for sepsis - Completed vancomycin  6/28.  Completed Zyvox  7/26.  Multiple myeloma - Port-A-Cath placed 7/30 by IR.  Started chemo with Velcade /Cytoxan  on 08/21/2023. Chemo changed to Sarclisa /Kyprolis /Cytoxan  later on.  Managed closely by oncology.  Oncology to work on setting up  outpatient chemotherapy in [place] upon discharge.  Acute kidney injury on CKD 3A - Secondary to multiple myeloma.  Briefly required HD.  AKI resolved, TDC removed.  Bilateral lower extremity DVT - Eliquis  on board.  Aspergillosis with pneumonia - Completed voriconazole  8/5.  Acute hypoxic respiratory failure - Trach removed 6/11, decannulated.  Resolved.  Acute metabolic encephalopathy-resolved  Anemia of chronic renal disease - Hemoglobin stable.  Insulin -dependent diabetes mellitus - Continue insulin  sliding scale.  Paroxysmal atrial fibrillation - Rate well-controlled.  Continue verapamil , Eliquis .  Obstructive sleep apnea - CPAP on board.  Dysphagia - Resolved.  PEG tube removed.  On regular diet.  Severe protein calorie malnutrition - Dietary on board.  Goals of care -Working closely with TOC, pt, on disposition planning.  Looking SNF in [place], awaiting bed availability and insurance auth.   Subjective: Patient resting comfortably this morning.  Denies any fever, chills, chest pain, nausea, vomiting, abdominal pain.  Appetite is good this morning.  In good spirits.  Physical Exam:  Vitals:   10/23/23 1909 10/24/23 0417 10/24/23 0727 10/24/23 1055  BP: 125/88 96/69 104/84   Pulse: 98 92 96   Resp: 16 20    Temp: 97.8 F (36.6 C) 97.7 F (36.5 C) 98 F (36.7 C)   TempSrc: Oral Oral Oral   SpO2: 99% 96% 92% 98%  Weight:      Height:        GENERAL:  Alert, pleasant, no acute distress  HEENT:  EOMI CARDIOVASCULAR:  RRR, no murmurs appreciated RESPIRATORY:  Clear to auscultation, no wheezing, rales, or rhonchi GASTROINTESTINAL:  Soft, nontender, nondistended EXTREMITIES: L UE above elbow amputation, draining right hip NEURO:  No  new focal deficits appreciated SKIN:  No rashes noted PSYCH:  Appropriate mood and affect     Data Reviewed:  Imaging Studies: IR SCLEROTHERAPY OF A FLUID COLLECTION Result Date: 10/23/2023 INDICATION: Right hip  fluid collection continued. Previous doxycycline  sclerosis performed 10/10/2023. Output still persisting although diminished. Planned second and final attempted sclerosing fluid collection. EXAM: Drain change MEDICATIONS: The patient is currently admitted to the hospital and receiving intravenous antibiotics. The antibiotics were administered within an appropriate time frame prior to the initiation of the procedure. ANESTHESIA/SEDATION: Moderate (conscious) sedation was employed during this procedure. A total of Versed  2 mg and Fentanyl  150 mcg was administered intravenously by the radiology nurse. Total intra-service moderate Sedation Time: 25 mL Omnipaque  3 minutes. The patient's level of consciousness and vital signs were monitored continuously by radiology nursing throughout the procedure under my direct supervision. COMPLICATIONS: None immediate. PROCEDURE: Informed written consent was obtained from the patient after a thorough discussion of the procedural risks, benefits and alternatives. All questions were addressed. Maximal Sterile Barrier Technique was utilized including caps, mask, sterile gowns, sterile gloves, sterile drape, hand hygiene and skin antiseptic. A timeout was performed prior to the initiation of the procedure. With the patient in a supine position, external portion of drainage in the right flank were prepped and draped in the usual sterile. Aggressive flushing was normal saline. Significant decrease in overall size the when to previous. When all of flushing began to return mostly clear rest of pocket was evacuated. Short Amplatz wire was then advanced through the catheter and the catheter was completely patient. New 10 catheter was then advanced over the guidewire coiled more medially. Approximately 40 mL of reconstituted doxycycline  was then injected into the catheter and the catheter was capped. Retention suture and sterile dressing applied. The catheter will be reopened to suction  approximately 2 hours. That time there will be no more flushing of the catheter. IMPRESSION: Repeat sclerosing right flank cavity using combination fluoroscopy and aggressive flushing with doxycycline . Electronically Signed   By: Cordella Banner   On: 10/23/2023 16:23   IR Catheter Tube Change Result Date: 10/23/2023 INDICATION: Right hip fluid collection continued. Previous doxycycline  sclerosis performed 10/10/2023. Output still persisting although diminished. Planned second and final attempted sclerosing fluid collection. EXAM: Drain change MEDICATIONS: The patient is currently admitted to the hospital and receiving intravenous antibiotics. The antibiotics were administered within an appropriate time frame prior to the initiation of the procedure. ANESTHESIA/SEDATION: Moderate (conscious) sedation was employed during this procedure. A total of Versed  2 mg and Fentanyl  150 mcg was administered intravenously by the radiology nurse. Total intra-service moderate Sedation Time: 25 mL Omnipaque  3 minutes. The patient's level of consciousness and vital signs were monitored continuously by radiology nursing throughout the procedure under my direct supervision. COMPLICATIONS: None immediate. PROCEDURE: Informed written consent was obtained from the patient after a thorough discussion of the procedural risks, benefits and alternatives. All questions were addressed. Maximal Sterile Barrier Technique was utilized including caps, mask, sterile gowns, sterile gloves, sterile drape, hand hygiene and skin antiseptic. A timeout was performed prior to the initiation of the procedure. With the patient in a supine position, external portion of drainage in the right flank were prepped and draped in the usual sterile. Aggressive flushing was normal saline. Significant decrease in overall size the when to previous. When all of flushing began to return mostly clear rest of pocket was evacuated. Short Amplatz wire was then advanced  through the catheter and the catheter was  completely patient. New 10 catheter was then advanced over the guidewire coiled more medially. Approximately 40 mL of reconstituted doxycycline  was then injected into the catheter and the catheter was capped. Retention suture and sterile dressing applied. The catheter will be reopened to suction approximately 2 hours. That time there will be no more flushing of the catheter. IMPRESSION: Repeat sclerosing right flank cavity using combination fluoroscopy and aggressive flushing with doxycycline . Electronically Signed   By: Cordella Banner   On: 10/23/2023 16:23   IR IMAGING GUIDED PORT INSERTION Result Date: 10/15/2023 INDICATION: 58 year old male with history multiple myeloma requiring central venous access for chemotherapy administration EXAM: IMPLANTED PORT A CATH PLACEMENT WITH ULTRASOUND AND FLUOROSCOPIC GUIDANCE COMPARISON:  None Available. MEDICATIONS: None. ANESTHESIA/SEDATION: Moderate (conscious) sedation was employed during this procedure. A total of Versed  1.5 mg and Fentanyl  75 mcg was administered intravenously. Moderate Sedation Time: 14 minutes. The patient's level of consciousness and vital signs were monitored continuously by radiology nursing throughout the procedure under my direct supervision. CONTRAST:  None FLUOROSCOPY TIME:  Ten mGy reference air kerma COMPLICATIONS: None immediate. PROCEDURE: The procedure, risks, benefits, and alternatives were explained to the patient. Questions regarding the procedure were encouraged and answered. The patient understands and consents to the procedure. The right neck and chest were prepped with chlorhexidine  in a sterile fashion, and a sterile drape was applied covering the operative field. Maximum barrier sterile technique with sterile gowns and gloves were used for the procedure. A timeout was performed prior to the initiation of the procedure. Ultrasound was used to examine the jugular vein which was  compressible and free of internal echoes. A skin marker was used to demarcate the planned venotomy and port pocket incision sites. Local anesthesia was provided to these sites and the subcutaneous tunnel track with 1% lidocaine  with 1:100,000 epinephrine . A small incision was created at the jugular access site and blunt dissection was performed of the subcutaneous tissues. Under ultrasound guidance, the jugular vein was accessed with a 21 ga micropuncture needle and an 0.018 wire was inserted to the superior vena cava. Real-time ultrasound guidance was utilized for vascular access including the acquisition of a permanent ultrasound image documenting patency of the accessed vessel. A 5 Fr micopuncture set was then used, through which a 0.035 Rosen wire was passed under fluoroscopic guidance into the inferior vena cava. An 8 Fr dilator was then placed over the wire. A subcutaneous port pocket was then created along the upper chest wall utilizing a combination of sharp and blunt dissection. The pocket was irrigated with sterile saline, packed with gauze, and observed for hemorrhage. A single lumen ISP sized power injectable port was chosen for placement. The 8 Fr catheter was tunneled from the port pocket site to the venotomy incision. The port was placed in the pocket. The external catheter was trimmed to appropriate length. The dilator was exchanged for an 8 Fr peel-away sheath under fluoroscopic guidance. The catheter was then placed through the sheath and the sheath was removed. Final catheter positioning was confirmed and documented with a fluoroscopic spot radiograph. The port was accessed with a Huber needle, aspirated, and flushed with heparinized saline. The deep dermal layer of the port pocket incision was closed with interrupted 3-0 Vicryl suture. Dermabond was then placed over the port pocket and neck incisions. The patient tolerated the procedure well without immediate post procedural complication.  FINDINGS: After catheter placement, the tip lies within the superior cavoatrial junction. The catheter aspirates and flushes normally and  is ready for immediate use. IMPRESSION: Successful placement of a power injectable Port-A-Cath via the right internal jugular vein. The catheter is ready for immediate use. Ester Sides, MD [facility] Radiology Electronically Signed   By: Ester Sides M.D.   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 Multiple myeloma. EXAM: LEFT HUMERUS - 2+ VIEW COMPARISON:  09/15/2023 FINDINGS: A single C-arm image demonstrates the recently demonstrated lytic changes in the distal humerus and associated fixation hardware. IMPRESSION: Single C-arm image of the distal humerus and associated fixation hardware. Electronically Signed   By: Elspeth Bathe M.D.   On: 10/13/2023 11:05   DG C-Arm 1-60 Min-No Report Result Date: 10/13/2023 Fluoroscopy was utilized by the requesting physician.  No radiographic interpretation.   DG C-Arm 1-60 Min-No Report Result Date: 10/13/2023 Fluoroscopy was utilized by the requesting physician.  No radiographic interpretation.   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 Planned sclerosing. EXAM: Sclerosing of abnormal fluid collection COMPARISON:  None Available. MEDICATIONS: 500 mg doxycycline ; intra procedural ANESTHESIA/SEDATION: Moderate (conscious) sedation was employed during this procedure. A total of Versed  2 mg and Fentanyl  100 mcg was administered intravenously by the radiology nurse. Total intra-service moderate Sedation Time: 11 minutes. The patient's level of consciousness and vital signs were monitored continuously by radiology nursing throughout the procedure under my direct supervision. CONTRAST:  10 mL  Omnipaque  300-administered into the collecting system(s) FLUOROSCOPY: Radiation Exposure Index (as provided by the fluoroscopic device): 5 mGy Kerma COMPLICATIONS: None immediate. PROCEDURE: Informed written consent was obtained from the patient after a thorough discussion of the procedural risks, benefits and alternatives. All questions were addressed. Maximal Sterile Barrier Technique was utilized including caps, mask, sterile gowns, sterile gloves, sterile drape, hand hygiene and skin antiseptic. A timeout was performed prior to the initiation of the procedure. The indwelling catheter in the right hip region prepped and draped in usual sterile fashion. The indwelling 10 French catheter was aggressively irrigated with normal saline and flushed. Dilute contrast was then injected under fluoroscopic guidance into the catheter to evaluate if the fluid collection was communicating with vascular or lymphatic structure. Contrast flows freely into the catheter and outlines the fluid collection which is irregular in appearance, however there is no vascular or lymphatic communication identified. The contrast was then mostly withdrawn. The catheter and retention sutures were cut. A new 10 French catheter was then advanced over a guidewire into the fluid collection and repositioned in a more cephalad location. Approximately 500 mg of doxycycline  was reconstituted with 50 mL of normal saline and then approximately 35 mL of this mixture was injected into the 10 French pigtail catheter. The catheter was then capped. Retention suture and sterile dressing were applied. The doxycycline  will dwell within the cavity for approximately 1 hour and then the catheter will be connected to a JP bulb and the patient will be monitored for output. IMPRESSION: Satisfactory exchange of a 10 French right hip drainage catheter with sclerosing using doxycycline  as described above. Catheter will remain to a JP bulb until output has sufficiently  diminished. Electronically Signed   By: Cordella Banner   On: 10/10/2023 17:18   CT PELVIS WO CONTRAST Result Date: 10/07/2023 CLINICAL DATA:  Right hip drain evaluation. EXAM: CT PELVIS WITHOUT CONTRAST TECHNIQUE: Multidetector CT imaging of the pelvis was performed following the  standard protocol without intravenous contrast. RADIATION DOSE REDUCTION: This exam was performed according to the departmental dose-optimization program which includes automated exposure control, adjustment of the mA and/or kV according to patient size and/or use of iterative reconstruction technique. COMPARISON:  CT abdomen pelvis dated 07/08/2023. FINDINGS: Urinary Tract: The visualized ureters appear unremarkable. Punctate stone noted in the posterior bladder lumen. The urinary bladder is otherwise unremarkable. Bowel:  Moderate stool in the colon.  The appendix is normal. Vascular/Lymphatic: Moderate aortoiliac atherosclerotic disease. No pelvic adenopathy. Reproductive: The prostate and seminal vesicles are grossly unremarkable. Other: Right gluteal pigtail drainage catheter. Significant interval decrease in the size of the hematoma in the right gluteal muscle now measuring approximately 4.7 x 9.3 cm. Additional loculated collection in the anterior upper right thigh significantly decreased in size since the prior CT and measures approximately 10 x 17 cm in greatest axial dimensions. Musculoskeletal: Osteopenia with degenerative changes of the spine. Total left hip arthroplasty. Scattered lucent lesions throughout the bones as seen on the prior CT. IMPRESSION: 1. Right gluteal pigtail drainage catheter with significant interval decrease in the size of the hematoma in the right gluteal muscle. 2. Significant interval decrease in the size of the loculated collection in the anterior upper right thigh. 3. Punctate stone in the posterior bladder lumen. 4.  Aortic Atherosclerosis (ICD10-I70.0). Electronically Signed   By: Vanetta Chou M.D.   On: 10/07/2023 16:53   DG CHEST PORT 1 VIEW Result Date: 10/06/2023 EXAM: 1 VIEW XRAY OF THE CHEST 10/06/2023 08:52:00 PM COMPARISON: 09/28/2023 CLINICAL HISTORY: 141880 SOB (shortness of breath) 141880. sob FINDINGS: LUNGS AND PLEURA: Increased interstitial markings, left greater than right, favoring atypical infection / pneumonia over interstitial edema. No pleural effusion. No pneumothorax. HEART AND MEDIASTINUM: No acute abnormality of the cardiac and mediastinal silhouettes. BONES AND SOFT TISSUES: Degenerative changes of the bilateral shoulders. No acute osseous abnormality. IMPRESSION: 1. Increased interstitial markings, left greater than right, favoring atypical infection/pneumonia over interstitial edema. Electronically signed by: Pinkie Pebbles MD 10/06/2023 08:58 PM EDT RP Workstation: [id number]   CT FEMUR RIGHT WO CONTRAST Result Date: 10/03/2023 CLINICAL DATA:  Right thigh fluid collection, drain maintenance EXAM: CT OF THE LOWER RIGHT EXTREMITY WITHOUT CONTRAST TECHNIQUE: Multidetector CT imaging of the right lower extremity was performed according to the standard protocol. RADIATION DOSE REDUCTION: This exam was performed according to the departmental dose-optimization program which includes automated exposure control, adjustment of the mA and/or kV according to patient size and/or use of iterative reconstruction technique. COMPARISON:  09/24/2023 FINDINGS: Bones/Joint/Cartilage Stable scattered bone lucencies, see description from the 09/24/2023 exam. Stable right hip arthropathy. Prominent osteoarthritis of the right knee. Left total knee prosthesis partially observed. Large spur like projections from the inferior patella extending into the joint. Moderate knee effusion possibly with synovitis. Suspected free osteochondral fragments in the knee joint. Ligaments N/A Muscles and Tendons Semimembranosus muscular atrophy bilaterally. Soft tissues Reduced size of the fluid collection  along the superficial fascia margin of the tensor fascia lata and rectus femoris, currently measuring about 20.9 by 3.0 by 11.8 cm (volume = 390 cm^3). Previous total volume cannot be calculated as this was not completely included, but previous total volume was probably about 3 times is much. Surrounding subcutaneous edema along the right lateral thigh tracking down to the knee. There is also some subcutaneous edema medially in both thighs. Edema tracks along the superficial fascia margin of the posterior calf musculature in the upper calf region. Prominence of stool in visualized colon,  cannot exclude constipation. Atherosclerosis noted. IMPRESSION: 1. Reduced size of the fluid collection along the superficial fascia margin of the tensor fascia lata and rectus femoris, currently measuring about 20.9 by 3.0 by 11.8 cm (volume = 390 cc). Previous total volume cannot be calculated as this was not completely included, but previous total volume was probably about 3 times is much. 2. Surrounding subcutaneous edema along the right lateral thigh tracking down to the knee. There is also some subcutaneous edema medially in both thighs. Edema tracks along the superficial fascia margin of the posterior calf musculature in the upper calf region. 3. Prominent osteoarthritis of the right knee. Moderate knee effusion possibly with synovitis. Suspected free osteochondral fragments in the knee joint. 4. Prominence of stool in visualized colon, cannot exclude constipation. 5. Atherosclerosis. 6. Stable scattered bone lucencies, see description from the 09/24/2023 exam. Electronically Signed   By: Ryan Salvage M.D.   On: 10/03/2023 08:21   VAS US  LOWER EXTREMITY VENOUS (DVT) Result Date: 10/01/2023  Lower Venous DVT Study Patient Name:  Dayvon Dax.  Date of Exam:   10/01/2023 Medical Rec #: [id number]           Accession #:    [id number] Date of Birth: [birth year]           Patient Gender: M Patient Age:   57 years Exam  Location:  [facility] Procedure:      VAS US  LOWER EXTREMITY VENOUS (DVT) Referring Phys: DELILIAH RASHID --------------------------------------------------------------------------------  Indications: Right thigh swelling, erythema, pain.  Risk Factors: Extended hospitalization with immobility (3 months). Limitations: Poor ultrasound/tissue interface. Comparison Study: LLEV on 07/18/2023 was negative for DVT. Performing Technologist: Ezzie Potters RVT, RDMS  Examination Guidelines: A complete evaluation includes B-mode imaging, spectral Doppler, color Doppler, and power Doppler as needed of all accessible portions of each vessel. Bilateral testing is considered an integral part of a complete examination. Limited examinations for reoccurring indications may be performed as noted. The reflux portion of the exam is performed with the patient in reverse Trendelenburg.  +---------+---------------+---------+-----------+----------+-------------------+ RIGHT    CompressibilityPhasicitySpontaneityPropertiesThrombus Aging      +---------+---------------+---------+-----------+----------+-------------------+ CFV      Full           Yes      Yes                                      +---------+---------------+---------+-----------+----------+-------------------+ SFJ      Full                                                             +---------+---------------+---------+-----------+----------+-------------------+ FV Prox  Full           Yes      Yes                                      +---------+---------------+---------+-----------+----------+-------------------+ FV Mid   Full           Yes      Yes                                      +---------+---------------+---------+-----------+----------+-------------------+  FV DistalFull           Yes      Yes                                      +---------+---------------+---------+-----------+----------+-------------------+ PFV      Full                                                              +---------+---------------+---------+-----------+----------+-------------------+ POP      Full           Yes      Yes                                      +---------+---------------+---------+-----------+----------+-------------------+ PTV      None           No       No                   Age indeterminate                                                         (one of paired)     +---------+---------------+---------+-----------+----------+-------------------+ PERO     Full                                                             +---------+---------------+---------+-----------+----------+-------------------+   +---------+---------------+---------+-----------+----------+--------------+ LEFT     CompressibilityPhasicitySpontaneityPropertiesThrombus Aging +---------+---------------+---------+-----------+----------+--------------+ CFV      None           No       No                   Acute          +---------+---------------+---------+-----------+----------+--------------+ SFJ      Partial                                      Acute          +---------+---------------+---------+-----------+----------+--------------+ FV Prox  Full           Yes      Yes                                 +---------+---------------+---------+-----------+----------+--------------+ FV Mid   Full           Yes      Yes                                 +---------+---------------+---------+-----------+----------+--------------+ FV DistalFull  Yes      Yes                                 +---------+---------------+---------+-----------+----------+--------------+ PFV      None           No       No                                  +---------+---------------+---------+-----------+----------+--------------+ POP      Full           Yes      Yes                                  +---------+---------------+---------+-----------+----------+--------------+ PTV      Full                                                        +---------+---------------+---------+-----------+----------+--------------+ PERO     Full                                                        +---------+---------------+---------+-----------+----------+--------------+ Proximal CFV compresses, has color, and phasic flow. Mid and distal CFV have occlusive thrombus    Summary: BILATERAL: -No evidence of popliteal cyst, bilaterally. RIGHT: - Findings consistent with age indeterminate deep vein thrombosis involving the right posterior tibial veins.   LEFT: - Findings consistent with acute deep vein thrombosis involving the left common femoral vein, SF junction, and left femoral vein.  - The common femoral vein obstruction does not appear to extend above inguinal ligament.  *See table(s) above for measurements and observations. Electronically signed by Gaile New MD on 10/01/2023 at 5:11:34 PM.    Final    IR GASTROSTOMY TUBE REMOVAL Result Date: 10/01/2023 INDICATION: Gastrostomy tube is no longer needed. EXAM: GASTROSTOMY TUBE REMOVAL MEDICATIONS: None ANESTHESIA/SEDATION: None CONTRAST:  None FLUOROSCOPY: None COMPLICATIONS: None immediate. PROCEDURE: Informed written consent was obtained from the patient after a thorough discussion of the procedural risks, benefits and alternatives. All questions were addressed. A timeout was performed prior to the initiation of the procedure. Balloon retention 18 French gastrostomy tube was intact. 5 cc syringe was used to remove 5 cc of saline from balloon. Gastrostomy tube was removed without difficulty. Site was cleaned and dressed appropriately. IMPRESSION: Successful removal of 18 French balloon retention gastrostomy tube. Performed by Carlin Griffon, PA-C Electronically Signed   By: Juliene Balder M.D.   On: 10/01/2023 16:40   US  RT LOWER EXTREM LTD SOFT TISSUE NON  VASCULAR Result Date: 09/30/2023 CLINICAL DATA:  Evaluate for abscess. EXAM: ULTRASOUND RIGHT LOWER EXTREMITY LIMITED TECHNIQUE: Ultrasound examination of the lower extremity soft tissues was performed in the area of clinical concern. COMPARISON:  None Available. FINDINGS: In the region of palpable abnormality in the lateral aspect of the right thigh there is a well-circumscribed is 8.5 x 2.3 x 6.8 cm fluid collection in the subcutaneous tissues. This is 1.8 cm deep  to the skin. There is no abnormal vascularity. There is no soft tissue mass. IMPRESSION: 8.5 cm fluid collection in the subcutaneous tissues of the lateral aspect of the right thigh. This may represent a seroma or abscess. Electronically Signed   By: Greig Pique M.D.   On: 09/30/2023 21:47   DG Knee 1-2 Views Right Result Date: 09/29/2023 CLINICAL DATA:  Knee pain and swelling EXAM: RIGHT KNEE - 1-2 VIEW COMPARISON:  X-ray 01/18/2019 FINDINGS: Moderate joint effusion. Moderate joint space loss of the medial compartment and patellofemoral joint. Tricompartmental osteophytes are seen greatest of the patellofemoral joint. Chondrocalcinosis also identified. There is medial translation of the femur relative to the tibia as well. Preserved bone mineralization. IMPRESSION: Advanced tricompartmental degenerative changes. Moderate joint effusion. Chondrocalcinosis Electronically Signed   By: Ranell Bring M.D.   On: 09/29/2023 15:57   DG Ribs Unilateral Left Result Date: 09/28/2023 CLINICAL DATA:  Left-sided rib pain after cough EXAM: LEFT RIBS - 2 VIEW COMPARISON:  Chest x-ray 09/28/2023.  Chest CT 07/26/2023. FINDINGS: Expansile anterolateral left 6 rib lesion is again noted. No displaced fractures are identified. Lungs are grossly clear. IMPRESSION: Expansile anterolateral left 6 rib lesion is again noted. Correlate for point tenderness. No displaced fractures are identified. Electronically Signed   By: Greig Pique M.D.   On: 09/28/2023 21:50   DG  CHEST PORT 1 VIEW Result Date: 09/28/2023 CLINICAL DATA:  Chest pain.  Attention left ribs. EXAM: PORTABLE CHEST 1 VIEW COMPARISON:  08/23/2023, CT 07/26/2023 FINDINGS: Removal of right-sided dialysis catheter and tracheostomy tube. Low lung volumes persist. Ill-defined opacity at the left lung base likely pleural effusion and airspace disease/atelectasis. Stable heart size and mediastinal contours. Vascular congestion. No pneumothorax. Chronic bilateral shoulder arthropathy. Lucent lesions within the thoracic osseous structures on prior CT, including left rib, not well demonstrated by radiograph. IMPRESSION: 1. Ill-defined opacity at the left lung base likely pleural effusion and airspace disease/atelectasis. 2. Vascular congestion. 3. Lucent lesions within the thoracic osseous structures on prior CT, including left rib, are not well demonstrated by radiograph. Electronically Signed   By: Andrea Gasman M.D.   On: 09/28/2023 16:40   US  IMAGE GUIDED FLUID DRAIN BY CATHETER Result Date: 09/26/2023 CLINICAL DATA:  Large right gluteal fluid collection likely evolving hematoma or potential abscess EXAM: US  IMAGE GUIDED FLUID DRAIN BY CATHETER TECHNIQUE: Ultrasound guidance COMPARISON:  None Available. FINDINGS: With the patient in a supine position, the right gluteal region and hip region were evaluated with ultrasound. A large fluid collection corresponding with previous visualized fluid collection is again seen. The patient was prepped and draped in usual sterile fashion. Local anesthesia was achieved by infiltrating subcutaneous tissue with 1% lidocaine . A small incision was made and then a 7 cm Yueh needle was advanced under ultrasound guidance from the incision to midpoint of the fluid collection. The needle was removed and short Amplatz wire was advanced under ultrasound guidance and coiled within the fluid collection. The Yueh catheter was then removed and the access site was dilated with a 10 French  fascial dilator. A 10 French pigtail catheter was then advanced over the guidewire and coiled within the fluid collection. Locking mechanism engaged. Retention suture and sterile dressing applied. Small sample was obtained and sent to pathology for aerobic and anaerobe evaluation. IMPRESSION: Satisfactory 10 French drainage catheter placed in a right hip abscess. Fluid appears to be infected hematoma. Electronically Signed   By: Cordella Banner   On: 09/26/2023 10:41   CT  HEAD WO CONTRAST ( ) Result Date: 09/25/2023 CLINICAL DATA:  Initial evaluation for acute neuro deficit, stroke suspected. EXAM: CT HEAD WITHOUT CONTRAST TECHNIQUE: Contiguous axial images were obtained from the base of the skull through the vertex without intravenous contrast. RADIATION DOSE REDUCTION: This exam was performed according to the departmental dose-optimization program which includes automated exposure control, adjustment of the mA and/or kV according to patient size and/or use of iterative reconstruction technique. COMPARISON:  CT from 09/22/2023. FINDINGS: Brain: Cerebral volume within normal limits. Patchy hypodensity involving the supratentorial cerebral white matter, most characteristic of chronic microvascular ischemic disease, moderate in nature. No acute intracranial hemorrhage. No acute large vessel territory infarct. No mass lesion, midline shift or mass effect. No hydrocephalus or extra-axial fluid collection. Vascular: No abnormal hyperdense vessel. Calcified atherosclerosis present at the skull base. Skull: Scalp soft tissues demonstrate no acute finding. Multiple lucent lesions again noted throughout the calvarium and visualized skull base, consistent with history of multiple myeloma. Sinuses/Orbits: Globes orbital soft tissues within normal limits. Mild mucosal thickening noted about the right maxillary sinus. Minimal pneumatized secretions noted within the right sphenoid sinus. Large right mastoid and middle ear  effusion, with small left mastoid effusion. Imaged nasopharynx grossly unremarkable. Other: None. IMPRESSION: 1. No acute intracranial abnormality. 2. Moderate chronic microvascular ischemic disease. 3. Multiple lucent lesions throughout the calvarium and visualized skull base, consistent with history of multiple myeloma. 4. Large right mastoid and middle ear effusion, with small left mastoid effusion. Findings of uncertain significance, however, correlation with physical exam for possible acute otomastoiditis recommended. Electronically Signed   By: Morene Hoard M.D.   On: 09/25/2023 20:46   CT HIP RIGHT WO CONTRAST Result Date: 09/25/2023 CLINICAL DATA:  Right hip pain EXAM: CT OF THE RIGHT HIP WITHOUT CONTRAST TECHNIQUE: Multidetector CT imaging of the right hip was performed according to the standard protocol. Multiplanar CT image reconstructions were also generated. RADIATION DOSE REDUCTION: This exam was performed according to the departmental dose-optimization program which includes automated exposure control, adjustment of the mA and/or kV according to patient size and/or use of iterative reconstruction technique. COMPARISON:  CT right femur 08/01/2023 FINDINGS: Bones/Joint/Cartilage Moderate to prominent axial and craniocaudad chondral thinning in the right hip associated with substantial spurring of the right femoral head. Mildly aspherical femoral head morphology may predispose to cam type femoroacetabular impingement. Scattered degenerative subcortical cystic lesions along the right femoral head and acetabulum. Scattered lucencies in the marrow of the visualized bony structures including the right iliac bone, ischium, and right proximal femur. Entities such as myeloma are not excluded. Similar lesions have been reported in other bony structures example in cranial structures on the MRI head from 07/26/2023. Ligaments Suboptimally assessed by CT. Muscles and Tendons Moderate regional muscular  atrophy. Soft tissues Extending partially along the superficial fascia margin of the gluteus medius and rectus femoris muscle, there is a large fluid collection which extends above and below the margins of today's CT scan. This measures 12.9 by 6.3 cm in cross-section on image 93 of series 5 and was also present on 08/01/2023, tracking cephalad on that exam all the way up to the level of the iliac crests. Appearance favors chronic hematoma or possibly a Morel-Lavalle lesion given the lack of resolution. Right iliac artery atherosclerosis. IMPRESSION: 1. 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 Mildly aspherical femoral head morphology may predispose to cam type femoroacetabular impingement. 2. Scattered lucencies in the marrow of the visualized bony structures including the right iliac bone,  ischium, and right proximal femur. Entities such as myeloma are not excluded. Similar lesions have been reported in other bony structures example in cranial structures on the MRI head from 07/26/2023. 3. Large fluid collection extending partially along the superficial fascia margin of the gluteus medius and rectus femoris muscle, also present on 08/01/2023, tracking cephalad on that exam all the way up to the level of the iliac crests. Appearance favors chronic hematoma or possibly a Morel-Lavallee lesion given the lack of resolution. 4. Moderate regional muscular atrophy. 5. Right iliac artery atherosclerosis. Electronically Signed   By: Ryan Salvage M.D.   On: 09/25/2023 08:00    There are no new results to review at this time.  Previous records (including but not limited to H&P, progress notes, nursing notes, TOC management) were reviewed in assessment of this patient.  Labs: CBC: Recent Labs  Lab 10/20/23 0403 10/21/23 0630 10/22/23 0337 10/23/23 0310 10/24/23 0357  WBC 5.3 6.1 9.0 6.5 5.9  NEUTROABS  --  3.4 7.0 4.0 3.4  HGB 9.5* 9.1* 9.5* 9.5* 9.6*  HCT 30.3* 28.7* 30.2* 31.3* 31.7*   MCV 101.3* 100.0 99.7 100.3* 102.6*  PLT 326 369 371 338 317   Basic Metabolic Panel: Recent Labs  Lab 10/20/23 0403 10/21/23 0339 10/22/23 0337 10/23/23 0310 10/24/23 0357  NA 133* 134* 132* 131* 132*  K 4.3 4.3 4.4 4.3 4.4  CL 99 99 98 99 100  CO2 25 26 25 24 24   GLUCOSE 109* 136* 189* 128* 89  BUN 42* 45* 44* 49* 46*  CREATININE 1.32* 1.26* 1.27* 1.26* 1.24  CALCIUM  9.1 8.8* 8.3* 8.2* 8.5*  MG  --   --   --  1.9 1.8   Liver Function Tests: Recent Labs  Lab 10/20/23 0403 10/21/23 0339 10/22/23 0337 10/23/23 0310 10/24/23 0357  AST 8* 9* 11* 8* 9*  ALT 7 6 7 7 7   ALKPHOS 57 62 60 63 60  BILITOT 0.5 0.5 0.3 0.4 0.3  PROT 5.8* 5.4* 5.2* 5.4* 5.2*  ALBUMIN  2.3* 2.2* 2.1* 2.3* 2.3*   CBG: Recent Labs  Lab 10/23/23 0805 10/23/23 1157 10/23/23 1551 10/23/23 1910 10/24/23 0728  GLUCAP 89 204* 170* 149* 104*    Scheduled Meds:  (feeding supplement) PROSource Plus  30 mL Oral TID BM   acyclovir   400 mg Oral BID   apixaban   5 mg Oral BID   collagenase    Topical Daily   docusate sodium   100 mg Oral BID   epoetin  alfa  40,000 Units Subcutaneous Q Wed-1800   feeding supplement  237 mL Oral BID WC   guaiFENesin   600 mg Oral BID   heparin  lock flush  500 Units Intravenous Once   hydrALAZINE   25 mg Oral Q8H   insulin  aspart  0-15 Units Subcutaneous TID AC & HS   insulin  aspart  2 Units Subcutaneous TID AC & HS   insulin  glargine-yfgn  13 Units Subcutaneous Q24H   lidocaine   1 patch Transdermal Q24H   montelukast   5 mg Oral QHS   multivitamin with minerals  1 tablet Oral Daily   nutrition supplement (JUVEN)  1 packet Oral BID AC   mouth rinse  15 mL Mouth Rinse 4 times per day   pantoprazole   40 mg Oral BID   PARoxetine   40 mg Oral Daily   polyethylene glycol  17 g Oral BID   senna  1 tablet Oral Daily   umeclidinium-vilanterol  1 puff Inhalation Daily   valproic  acid  750 mg Oral BID   verapamil   120 mg Oral Daily   Continuous Infusions:  sodium chloride   Stopped (10/13/23 0952)   sodium chloride  Stopped (08/28/23 0839)   sodium chloride      PRN Meds:.acetaminophen  (TYLENOL ) oral liquid 160 mg/5 mL **OR** acetaminophen , alum & mag hydroxide-simeth, benzonatate , cyclobenzaprine , HYDROmorphone  (DILAUDID ) injection, hydrOXYzine , ipratropium-albuterol , metoCLOPramide  **OR** metoCLOPramide  (REGLAN ) injection, ondansetron  (ZOFRAN ) IV, oxyCODONE , polyethylene glycol, artificial tears, sodium chloride  flush  Family Communication: None at bedside  Disposition: Status is: Inpatient Remains inpatient appropriate because: See above     Time spent: 32 minutes  Length of inpatient stay: 113 days  Author: Carliss LELON Canales, DO 10/24/2023 11:47 AM  For on call review [email or web address].

## 2023-10-24 NOTE — Progress Notes (Signed)
 Occupational Therapy Treatment Patient Details Name: Frank [name]. MRN: [id number] DOB: 05/24/1965 Today's Date: 10/24/2023   History of present illness 58 y.o. male transferred from [facility] 07/03/23 to [facility] for management of newly diagnosed plasma cell myeloma with weakness and AMS. Pt also with AKI and metabolic encephalopathy. 5/10 transfer to [facility] for iHD. 4/22 Intubated and chemo initiated. 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 5/6 IVIG started. 5/8-6/11 trach. 5/12 iHD initiated. 5/29 PEG placed. 6/5 chemo initiated. 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  Focus of session was LB dressing, sitting tolerance, and w/c transfers.  Pt. Able to don shorts and utilize sliding board to get to/from w/c to bed.  L shoulder ROM for improving shoulder retraction and cervical ROM. Max cues for limiting self distraction that pt. Has with conversation.   Sliding board with min a x2 with cues for sequencing and body mechanics for effective repositioning and transfers.  Able to propel w/c but required rest breaks through out.  Outside today for cont. LB exercises and change of scenery. Pt. Very receptive and verbalized how happy he was to be outside.    Education on self checks for circulation and how BLEs look when elevation of them is needed in conjunction with sensory signals for when pressure relief and repositioning is needed when sitting up.  Pt. Remains motivated and eager for participation.  Cont. With acute OT POC.        If plan is discharge home, recommend the following:  Two people to help with walking and/or transfers;Assistance with cooking/housework;Assistance with feeding;Direct supervision/assist for medications management;Direct supervision/assist for financial management;Assist for transportation;Help with stairs or ramp for entrance;A lot of help with  bathing/dressing/bathroom   Equipment Recommendations       Recommendations for Other Services      Precautions / Restrictions Precautions Precautions: Fall Recall of Precautions/Restrictions: Impaired Precaution/Restrictions Comments: plan for amputation, Dr Kendal okay with arm in sling Restrictions LUE Weight Bearing Per Provider Order: Non weight bearing Other Position/Activity Restrictions: ROM to tolerance       Mobility Bed Mobility Overal bed mobility: Needs Assistance Bed Mobility: Supine to Sit Rolling: Supervision   Supine to sit: HOB elevated, Min assist, Used rails Sit to supine: Mod assist   General bed mobility comments: cuing for rolling L and R for pulling up shorts in bed, min A for use of bed pad with hip scooting modA for returning LE to bed at end of session    Transfers Overall transfer level: Needs assistance   Transfers: Bed to chair/wheelchair/BSC            Lateral/Scoot Transfers: Min assist, With slide board, +2 physical assistance, From elevated surface General transfer comment: maximal cuing for sequencing use of slide board, body mechanics, and weighshifting to be able to slide to and from wheelchair, min Ax2 for physical assist     Balance                                           ADL either performed or assessed with clinical judgement   ADL Overall ADL's : Needs assistance/impaired                 Upper Body Dressing : Minimal  assistance;Sitting Upper Body Dressing Details (indicate cue type and reason): don gown using RUE. Lower Body Dressing: Moderate assistance;Sitting/lateral leans;Bed level Lower Body Dressing Details (indicate cue type and reason): rolling L/R for donning shorts bed level, seated eob able to lean L and pull down over hips, leaned R and therapist asst. provided assitance to get over hips, once to top of thighs able to pull all the way down with rue and shuffle off of feet. able to  don/doff shoes w/o assistance Toilet Transfer: Minimal assistance;Moderate assistance;Cueing for sequencing;Cueing for safety;Transfer board Toilet Transfer Details (indicate cue type and reason): eob to w/c to the R with use of sliding board, back to bed on R side with use of sliding board.  (simulated for use with drop arm 3n1)                Extremity/Trunk Assessment              Vision       Perception     Praxis     Communication Communication Communication: Impaired Factors Affecting Communication: Reduced clarity of speech   Cognition Arousal: Alert Behavior During Therapy: WFL for tasks assessed/performed       Awareness: Intellectual awareness intact, Online awareness impaired     Executive functioning impairment (select all impairments): Problem solving                   Following commands: Impaired Following commands impaired: Follows one step commands with increased time      Cueing   Cueing Techniques: Verbal cues, Tactile cues, Visual cues  Exercises      Shoulder Instructions       General Comments educated pt on weightshifting for reduced risk of pressure injury, need for elevation of LE when they turn purple,    Pertinent Vitals/ Pain       Pain Assessment Pain Assessment: Faces Faces Pain Scale: Hurts little more Pain Location: LUE and right knee, RUE Pain Descriptors / Indicators: Grimacing, Guarding Pain Intervention(s): Limited activity within patient's tolerance, Monitored during session, Repositioned  Home Living                                          Prior Functioning/Environment              Frequency  Min 1X/week        Progress Toward Goals  OT Goals(current goals can now be found in the care plan section)  Progress towards OT goals: Progressing toward goals     Plan      Co-evaluation    PT/OT/SLP Co-Evaluation/Treatment: Yes Reason for Co-Treatment: For patient/therapist  safety;To address functional/ADL transfers PT goals addressed during session: Mobility/safety with mobility;Balance OT goals addressed during session: ADL's and self-care;Strengthening/ROM      AM-PAC OT 6 Clicks Daily Activity     Outcome Measure   Help from another person eating meals?: A Little Help from another person taking care of personal grooming?: A Little Help from another person toileting, which includes using toliet, bedpan, or urinal?: Total Help from another person bathing (including washing, rinsing, drying)?: A Lot Help from another person to put on and taking off regular upper body clothing?: A Lot Help from another person to put on and taking off regular lower body clothing?: A Lot 6 Click Score: 13    End of Session Equipment  Utilized During Treatment: Gait belt;Other (comment) (sliding board and w/c)  OT Visit Diagnosis: Unsteadiness on feet (R26.81);Other abnormalities of gait and mobility (R26.89);Muscle weakness (generalized) (M62.81);Pain Pain - Right/Left: Left Pain - part of body: Shoulder   Activity Tolerance Patient tolerated treatment well   Patient Left in bed;with call bell/phone within reach   Nurse Communication Other (comment) (rn present during session to give meds, notifed when leaving and returning to unit)        Time: 8944-8842 OT Time Calculation (min): 62 min  Charges: OT General Charges $OT Visit: 1 Visit OT Treatments $Self Care/Home Management : 23-37 mins  Randall, COTA/L Acute Rehabilitation [phone]   CHRISTELLA Nest Lorraine-COTA/L  10/24/2023, 2:09 PM

## 2023-10-24 NOTE — Progress Notes (Signed)
 This chaplain is present for F/U spiritual care. The Pt. is awake and celebrating life.   The chaplain listened reflectively as the Pt. shared with gratitude today's successes in therapy and the daily visits with [name]. The chaplain understands the Pt. is holding on to the hope of remaining at the hospital for chemotherapy and completing the last chemotherapy in a [place] rehab.  The Pt. is including his family, community, and faith in his story telling.  This chaplain is available for F/U spiritual care as needed.  Chaplain Leeroy Hummer [phone]

## 2023-10-24 NOTE — Progress Notes (Signed)
 Left arm cleased and bandaged with new bandage.

## 2023-10-24 NOTE — Progress Notes (Signed)
 Everything is going quite well with Frank [name].  He has a new drainage catheter in the right hip.  This apparently is working quite well.  He is very happy with how things are going with this.  He has had no other problems.  I am sure he will do some physical therapy today.  His appetite has been good.  There is been no nausea or vomiting.  He has had no diarrhea.  He has had no bleeding.  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  His labs show sodium 132.  Potassium 4.4.  BUN 46 creatinine 1.24.  Calcium  8.5 with an albumin  of 2.3.  White cell count 5.9.  Hemoglobin 9.6.  Platelet count 317,000.  I had a talk with he and his wife yesterday.  I told him that he can certainly continue his chemotherapy as an outpatient up in [place].  I know there is a cancer center in [place].  We can certainly try to make arrangements for him to be treated there as an outpatient.  I also told him that as long as he is in the hospital, we can treat him in the hospital.  Again, I am not sure where things stand with him being transferred up to a rehab facility in [place].  I am just happy that he is to come back so far.  He really has done nicely.   Jeralyn Crease, MD  Inge 29:11

## 2023-10-24 NOTE — TOC Progression Note (Addendum)
 Transition of Care (TOC) - Progression Note    Patient Details  Name: Frank [name]. MRN: [id number] Date of Birth: 01/25/1966  Transition of Care [facility]) CM/SW Contact  Lauraine FORBES Saa, LCSWA Phone Number: 10/24/2023, 11:55 AM  Clinical Narrative:     11:55 AM CSW followed up with bed offers at patient's preferred SNFs [facility], [facility], [place]). [facility] and [facility] SNFs that they current do not have male bed availability and do not expected male bed availability next week. CSW securely emailed requested medication list to [facility]. CSW attempted to call [facility] admissions, but there was no response and a voicemail was left.  4:54 PM CSW informed patient and patient's significant other, [name], of [facility] SNF and [facility] SNF lack of male bed availability. CSW informed patient and [name] that there has yet to have been a SNF bed decision made by [facility]. Patient expressed preference in [facility]. [name] was agreeable with patient's preference. CSW informed [facility] and medical team of bed acceptance. CSW relayed to medical team Michelle's concerns of blood clots in patient's legs. Medical team confirmed patient is to be on lifelong blood thinners. Oncologist informed medical team that patient is not yet medically ready to discharge. CSW to submit insurance authorization as patient becomes more medically ready for discharge. CSW will continue to follow and be available to assist.  Expected Discharge Plan: Skilled Nursing Facility Barriers to Discharge: Continued Medical Work up, English as a second language teacher, Other (must enter comment) (SNF pending bed decision)               Expected Discharge Plan and Services In-house Referral: Clinical Social Work Discharge Planning Services: CM Consult Post Acute Care Choice: Skilled Nursing Facility Living arrangements for the past 2  months: Single Family Home                 DME Arranged: N/A DME Agency: NA       HH Arranged: NA HH Agency: NA         Social Drivers of Health (SDOH) Interventions SDOH Screenings   Food Insecurity: No Food Insecurity (07/05/2023)  Housing: Low Risk  (07/05/2023)  Transportation Needs: No Transportation Needs (07/05/2023)  Utilities: Not At Risk (07/05/2023)  Tobacco Use: High Risk (10/13/2023)    Readmission Risk Interventions     No data to display

## 2023-10-25 DIAGNOSIS — N179 Acute kidney failure, unspecified: Secondary | ICD-10-CM | POA: Diagnosis not present

## 2023-10-25 DIAGNOSIS — A419 Sepsis, unspecified organism: Secondary | ICD-10-CM | POA: Diagnosis not present

## 2023-10-25 DIAGNOSIS — I480 Paroxysmal atrial fibrillation: Secondary | ICD-10-CM | POA: Diagnosis not present

## 2023-10-25 DIAGNOSIS — C9000 Multiple myeloma not having achieved remission: Secondary | ICD-10-CM | POA: Diagnosis not present

## 2023-10-25 LAB — COMPREHENSIVE METABOLIC PANEL WITH GFR
ALT: 7 U/L (ref 0–44)
AST: 11 U/L — ABNORMAL LOW (ref 15–41)
Albumin: 2.2 g/dL — ABNORMAL LOW (ref 3.5–5.0)
Alkaline Phosphatase: 64 U/L (ref 38–126)
Anion gap: 8 (ref 5–15)
BUN: 45 mg/dL — ABNORMAL HIGH (ref 6–20)
CO2: 25 mmol/L (ref 22–32)
Calcium: 8.6 mg/dL — ABNORMAL LOW (ref 8.9–10.3)
Chloride: 99 mmol/L (ref 98–111)
Creatinine, Ser: 1.19 mg/dL (ref 0.61–1.24)
GFR, Estimated: >60 mL/min (ref >60)
Glucose, Bld: 94 mg/dL (ref 70–99)
Potassium: 4.5 mmol/L (ref 3.5–5.1)
Sodium: 132 mmol/L — ABNORMAL LOW (ref 135–145)
Total Bilirubin: <0.2 mg/dL (ref 0.0–1.2)
Total Protein: 4.9 g/dL — ABNORMAL LOW (ref 6.5–8.1)

## 2023-10-25 LAB — CBC WITH DIFFERENTIAL/PLATELET
Abs Immature Granulocytes: 0.04 K/uL (ref 0.00–0.07)
Basophils Absolute: 0 K/uL (ref 0.0–0.1)
Basophils Relative: 0 %
Eosinophils Absolute: 0.1 K/uL (ref 0.0–0.5)
Eosinophils Relative: 2 %
HCT: 29.7 % — ABNORMAL LOW (ref 39.0–52.0)
Hemoglobin: 9.4 g/dL — ABNORMAL LOW (ref 13.0–17.0)
Immature Granulocytes: 1 %
Lymphocytes Relative: 23 %
Lymphs Abs: 1.3 K/uL (ref 0.7–4.0)
MCH: 31.3 pg (ref 26.0–34.0)
MCHC: 31.6 g/dL (ref 30.0–36.0)
MCV: 99 fL (ref 80.0–100.0)
Monocytes Absolute: 1 K/uL (ref 0.1–1.0)
Monocytes Relative: 17 %
Neutro Abs: 3.2 K/uL (ref 1.7–7.7)
Neutrophils Relative %: 57 %
Platelets: 353 K/uL (ref 150–400)
RBC: 3 MIL/uL — ABNORMAL LOW (ref 4.22–5.81)
RDW: 18.5 % — ABNORMAL HIGH (ref 11.5–15.5)
WBC: 5.6 K/uL (ref 4.0–10.5)
nRBC: 0.5 % — ABNORMAL HIGH (ref 0.0–0.2)

## 2023-10-25 LAB — GLUCOSE, CAPILLARY
Glucose-Capillary: 95 mg/dL (ref 70–99)
Glucose-Capillary: 108 mg/dL — ABNORMAL HIGH (ref 70–99)
Glucose-Capillary: 136 mg/dL — ABNORMAL HIGH (ref 70–99)
Glucose-Capillary: 175 mg/dL — ABNORMAL HIGH (ref 70–99)

## 2023-10-25 NOTE — Plan of Care (Signed)
 Problem: Education: Goal: Knowledge of General Education information will improve Description: Including pain rating scale, medication(s)/side effects and non-pharmacologic comfort measures Outcome: Progressing   Problem: Health Behavior/Discharge Planning: Goal: Ability to manage health-related needs will improve Outcome: Progressing   Problem: Clinical Measurements: Goal: Ability to maintain clinical measurements within normal limits will improve Outcome: Progressing Goal: Will remain free from infection Outcome: Progressing Goal: Diagnostic test results will improve Outcome: Progressing Goal: Respiratory complications will improve Outcome: Progressing Goal: Cardiovascular complication will be avoided Outcome: Progressing   Problem: Activity: Goal: Risk for activity intolerance will decrease Outcome: Progressing   Problem: Nutrition: Goal: Adequate nutrition will be maintained Outcome: Progressing   Problem: Coping: Goal: Level of anxiety will decrease Outcome: Progressing   Problem: Elimination: Goal: Will not experience complications related to bowel motility Outcome: Progressing Goal: Will not experience complications related to urinary retention Outcome: Progressing   Problem: Pain Managment: Goal: General experience of comfort will improve and/or be controlled Outcome: Progressing   Problem: Safety: Goal: Ability to remain free from injury will improve Outcome: Progressing   Problem: Skin Integrity: Goal: Risk for impaired skin integrity will decrease Outcome: Progressing   Problem: Education: Goal: Ability to describe self-care measures that may prevent or decrease complications (Diabetes Survival Skills Education) will improve Outcome: Progressing Goal: Individualized Educational Video(s) Outcome: Progressing   Problem: Coping: Goal: Ability to adjust to condition or change in health will improve Outcome: Progressing   Problem: Fluid  Volume: Goal: Ability to maintain a balanced intake and output will improve Outcome: Progressing   Problem: Health Behavior/Discharge Planning: Goal: Ability to identify and utilize available resources and services will improve Outcome: Progressing Goal: Ability to manage health-related needs will improve Outcome: Progressing   Problem: Metabolic: Goal: Ability to maintain appropriate glucose levels will improve Outcome: Progressing   Problem: Nutritional: Goal: Maintenance of adequate nutrition will improve Outcome: Progressing Goal: Progress toward achieving an optimal weight will improve Outcome: Progressing   Problem: Skin Integrity: Goal: Risk for impaired skin integrity will decrease Outcome: Progressing   Problem: Tissue Perfusion: Goal: Adequacy of tissue perfusion will improve Outcome: Progressing   Problem: Education: Goal: Knowledge about tracheostomy care/management will improve Outcome: Progressing   Problem: Activity: Goal: Ability to tolerate increased activity will improve Outcome: Progressing   Problem: Health Behavior/Discharge Planning: Goal: Ability to manage tracheostomy will improve Outcome: Progressing   Problem: Education: Goal: Knowledge of General Education information will improve Description: Including pain rating scale, medication(s)/side effects and non-pharmacologic comfort measures Outcome: Progressing   Problem: Health Behavior/Discharge Planning: Goal: Ability to manage health-related needs will improve Outcome: Progressing   Problem: Clinical Measurements: Goal: Ability to maintain clinical measurements within normal limits will improve Outcome: Progressing Goal: Will remain free from infection Outcome: Progressing Goal: Diagnostic test results will improve Outcome: Progressing Goal: Respiratory complications will improve Outcome: Progressing Goal: Cardiovascular complication will be avoided Outcome: Progressing    Problem: Activity: Goal: Risk for activity intolerance will decrease Outcome: Progressing   Problem: Nutrition: Goal: Adequate nutrition will be maintained Outcome: Progressing   Problem: Coping: Goal: Level of anxiety will decrease Outcome: Progressing   Problem: Elimination: Goal: Will not experience complications related to bowel motility Outcome: Progressing Goal: Will not experience complications related to urinary retention Outcome: Progressing   Problem: Pain Managment: Goal: General experience of comfort will improve and/or be controlled Outcome: Progressing   Problem: Safety: Goal: Ability to remain free from injury will improve Outcome: Progressing   Problem: Skin Integrity: Goal: Risk for impaired  skin integrity will decrease Outcome: Progressing

## 2023-10-25 NOTE — Plan of Care (Signed)

## 2023-10-25 NOTE — Progress Notes (Signed)
 Progress Note   Patient: Frank [name]. FMW:[id number] DOB: [birth year] DOA: 07/03/2023  DOS: the patient was seen and examined on 10/25/2023   Brief hospital course:  58 year old PMH OSA CPAP, pathological fracture and multiple ORIF left Humerus likely due to multiple myeloma, A-fib not on anticoagulation, hyperlipidemia, tobacco use disorder who was feeling weak for 1 week, admitted to [facility] health with concern of sepsis, community-acquired pneumonia and acute encephalopathy. Patient was found to have lytic lesions, he was transferred to [facility] for oncology treatment. In the meantime he developed encephalopathy, acute renal failure. He remained in the ICU for 36 days. Underwent tracheostomy. He was also started on HD after CRRT,off of HD now. Subsequently PEG tube placed 5/29 and IR drain placement of right thigh hip infected hematoma on 7/11 . S/p PEG tube removal. Needs Port-A-Cath placement (for outpatient chemo), either during this hospitalization or as an outpatient. F/u IR recommendations for drain management. Started on heparin  drip on 7/17 because of b/l LE DVT.  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 on 7/28.  Surgical EBL 750 cc. Oncology and IR following.  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  Wound VAC removed 8/1.  Dressing changes, Eliquis  per Ortho.  Infected right flank/gluteal hematoma - S/p IR drain placement 7/11.  Drain exchanged 7/25.  Completed antibiotic course 7/26.  IR following, possible drain removal monday.  Concern for sepsis - Completed vancomycin  6/28.  Completed Zyvox  7/26.  Multiple myeloma - Port-A-Cath placed 7/30 by IR.  Started chemo with Velcade /Cytoxan  on 08/21/2023. Chemo changed to Sarclisa /Kyprolis /Cytoxan  later on.  Managed closely by oncology.  Oncology to work on setting up outpatient chemotherapy in  [place] upon discharge.  Acute kidney injury on CKD 3A - Secondary to multiple myeloma.  Briefly required HD.  AKI resolved, TDC removed.  Bilateral lower extremity DVT - Eliquis  on board.  Pt will be on life long anticoagulation.  Aspergillosis with pneumonia - Completed voriconazole  8/5.  Acute hypoxic respiratory failure - Trach removed 6/11, decannulated.  Resolved.  Acute metabolic encephalopathy-resolved  Anemia of chronic renal disease - Hemoglobin stable.  Insulin -dependent diabetes mellitus - Continue insulin  sliding scale.  Paroxysmal atrial fibrillation - Rate well-controlled.  Continue verapamil , Eliquis .  Obstructive sleep apnea - CPAP on board.  Dysphagia - Resolved.  PEG tube removed.  On regular diet.  Severe protein calorie malnutrition - Dietary on board.  Goals of care -Working closely with TOC, pt, on disposition planning.  Looking SNF in [place], awaiting bed availability and insurance auth.   Subjective: Patient resting comfortably this morning.  Denies any fever, chills, chest pain, nausea, vomiting, abdominal pain.  In good spirits.  Physical Exam:  Vitals:   10/24/23 2129 10/25/23 0414 10/25/23 0730 10/25/23 0752  BP: (!) 124/95 (!) 129/91 108/72   Pulse:  87 97 92  Resp:  16 18 17   Temp:  98 F (36.7 C) 98.3 F (36.8 C)   TempSrc:  Oral Oral   SpO2:  96% 94% 96%  Weight:      Height:        GENERAL:  Alert, pleasant, no acute distress  HEENT:  EOMI CARDIOVASCULAR:  RRR, no murmurs appreciated RESPIRATORY:  Clear to auscultation, no wheezing, rales, or rhonchi GASTROINTESTINAL:  Soft, nontender, nondistended EXTREMITIES: L UE above elbow amputation, drain in right hip NEURO:  No new  focal deficits appreciated SKIN:  No rashes noted PSYCH:  Appropriate mood and affect     Data Reviewed:  Imaging Studies: IR SCLEROTHERAPY OF A FLUID COLLECTION Result Date: 10/23/2023 INDICATION: Right hip fluid collection continued.  Previous doxycycline  sclerosis performed 10/10/2023. Output still persisting although diminished. Planned second and final attempted sclerosing fluid collection. EXAM: Drain change MEDICATIONS: The patient is currently admitted to the hospital and receiving intravenous antibiotics. The antibiotics were administered within an appropriate time frame prior to the initiation of the procedure. ANESTHESIA/SEDATION: Moderate (conscious) sedation was employed during this procedure. A total of Versed  2 mg and Fentanyl  150 mcg was administered intravenously by the radiology nurse. Total intra-service moderate Sedation Time: 25 mL Omnipaque  3 minutes. The patient's level of consciousness and vital signs were monitored continuously by radiology nursing throughout the procedure under my direct supervision. COMPLICATIONS: None immediate. PROCEDURE: Informed written consent was obtained from the patient after a thorough discussion of the procedural risks, benefits and alternatives. All questions were addressed. Maximal Sterile Barrier Technique was utilized including caps, mask, sterile gowns, sterile gloves, sterile drape, hand hygiene and skin antiseptic. A timeout was performed prior to the initiation of the procedure. With the patient in a supine position, external portion of drainage in the right flank were prepped and draped in the usual sterile. Aggressive flushing was normal saline. Significant decrease in overall size the when to previous. When all of flushing began to return mostly clear rest of pocket was evacuated. Short Amplatz wire was then advanced through the catheter and the catheter was completely patient. New 10 catheter was then advanced over the guidewire coiled more medially. Approximately 40 mL of reconstituted doxycycline  was then injected into the catheter and the catheter was capped. Retention suture and sterile dressing applied. The catheter will be reopened to suction approximately 2 hours. That time  there will be no more flushing of the catheter. IMPRESSION: Repeat sclerosing right flank cavity using combination fluoroscopy and aggressive flushing with doxycycline . Electronically Signed   By: Cordella Banner   On: 10/23/2023 16:23   IR Catheter Tube Change Result Date: 10/23/2023 INDICATION: Right hip fluid collection continued. Previous doxycycline  sclerosis performed 10/10/2023. Output still persisting although diminished. Planned second and final attempted sclerosing fluid collection. EXAM: Drain change MEDICATIONS: The patient is currently admitted to the hospital and receiving intravenous antibiotics. The antibiotics were administered within an appropriate time frame prior to the initiation of the procedure. ANESTHESIA/SEDATION: Moderate (conscious) sedation was employed during this procedure. A total of Versed  2 mg and Fentanyl  150 mcg was administered intravenously by the radiology nurse. Total intra-service moderate Sedation Time: 25 mL Omnipaque  3 minutes. The patient's level of consciousness and vital signs were monitored continuously by radiology nursing throughout the procedure under my direct supervision. COMPLICATIONS: None immediate. PROCEDURE: Informed written consent was obtained from the patient after a thorough discussion of the procedural risks, benefits and alternatives. All questions were addressed. Maximal Sterile Barrier Technique was utilized including caps, mask, sterile gowns, sterile gloves, sterile drape, hand hygiene and skin antiseptic. A timeout was performed prior to the initiation of the procedure. With the patient in a supine position, external portion of drainage in the right flank were prepped and draped in the usual sterile. Aggressive flushing was normal saline. Significant decrease in overall size the when to previous. When all of flushing began to return mostly clear rest of pocket was evacuated. Short Amplatz wire was then advanced through the catheter and the  catheter was completely  patient. New 10 catheter was then advanced over the guidewire coiled more medially. Approximately 40 mL of reconstituted doxycycline  was then injected into the catheter and the catheter was capped. Retention suture and sterile dressing applied. The catheter will be reopened to suction approximately 2 hours. That time there will be no more flushing of the catheter. IMPRESSION: Repeat sclerosing right flank cavity using combination fluoroscopy and aggressive flushing with doxycycline . Electronically Signed   By: Cordella Banner   On: 10/23/2023 16:23   IR IMAGING GUIDED PORT INSERTION Result Date: 10/15/2023 INDICATION: 58 year old male with history multiple myeloma requiring central venous access for chemotherapy administration EXAM: IMPLANTED PORT A CATH PLACEMENT WITH ULTRASOUND AND FLUOROSCOPIC GUIDANCE COMPARISON:  None Available. MEDICATIONS: None. ANESTHESIA/SEDATION: Moderate (conscious) sedation was employed during this procedure. A total of Versed  1.5 mg and Fentanyl  75 mcg was administered intravenously. Moderate Sedation Time: 14 minutes. The patient's level of consciousness and vital signs were monitored continuously by radiology nursing throughout the procedure under my direct supervision. CONTRAST:  None FLUOROSCOPY TIME:  Ten mGy reference air kerma COMPLICATIONS: None immediate. PROCEDURE: The procedure, risks, benefits, and alternatives were explained to the patient. Questions regarding the procedure were encouraged and answered. The patient understands and consents to the procedure. The right neck and chest were prepped with chlorhexidine  in a sterile fashion, and a sterile drape was applied covering the operative field. Maximum barrier sterile technique with sterile gowns and gloves were used for the procedure. A timeout was performed prior to the initiation of the procedure. Ultrasound was used to examine the jugular vein which was compressible and free of internal  echoes. A skin marker was used to demarcate the planned venotomy and port pocket incision sites. Local anesthesia was provided to these sites and the subcutaneous tunnel track with 1% lidocaine  with 1:100,000 epinephrine . A small incision was created at the jugular access site and blunt dissection was performed of the subcutaneous tissues. Under ultrasound guidance, the jugular vein was accessed with a 21 ga micropuncture needle and an 0.018 wire was inserted to the superior vena cava. Real-time ultrasound guidance was utilized for vascular access including the acquisition of a permanent ultrasound image documenting patency of the accessed vessel. A 5 Fr micopuncture set was then used, through which a 0.035 Rosen wire was passed under fluoroscopic guidance into the inferior vena cava. An 8 Fr dilator was then placed over the wire. A subcutaneous port pocket was then created along the upper chest wall utilizing a combination of sharp and blunt dissection. The pocket was irrigated with sterile saline, packed with gauze, and observed for hemorrhage. A single lumen ISP sized power injectable port was chosen for placement. The 8 Fr catheter was tunneled from the port pocket site to the venotomy incision. The port was placed in the pocket. The external catheter was trimmed to appropriate length. The dilator was exchanged for an 8 Fr peel-away sheath under fluoroscopic guidance. The catheter was then placed through the sheath and the sheath was removed. Final catheter positioning was confirmed and documented with a fluoroscopic spot radiograph. The port was accessed with a Huber needle, aspirated, and flushed with heparinized saline. The deep dermal layer of the port pocket incision was closed with interrupted 3-0 Vicryl suture. Dermabond was then placed over the port pocket and neck incisions. The patient tolerated the procedure well without immediate post procedural complication. FINDINGS: After catheter placement,  the tip lies within the superior cavoatrial junction. The catheter aspirates and flushes normally and is  ready for immediate use. IMPRESSION: Successful placement of a power injectable Port-A-Cath via the right internal jugular vein. The catheter is ready for immediate use. Ester Sides, MD [facility] Electronically Signed   By: Ester Sides M.D.   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 Multiple myeloma. EXAM: LEFT HUMERUS - 2+ VIEW COMPARISON:  09/15/2023 FINDINGS: A single C-arm image demonstrates the recently demonstrated lytic changes in the distal humerus and associated fixation hardware. IMPRESSION: Single C-arm image of the distal humerus and associated fixation hardware. Electronically Signed   By: Elspeth Bathe M.D.   On: 10/13/2023 11:05   DG C-Arm 1-60 Min-No Report Result Date: 10/13/2023 Fluoroscopy was utilized by the requesting physician.  No radiographic interpretation.   DG C-Arm 1-60 Min-No Report Result Date: 10/13/2023 Fluoroscopy was utilized by the requesting physician.  No radiographic interpretation.   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 Planned sclerosing. EXAM: Sclerosing of abnormal fluid collection COMPARISON:  None Available. MEDICATIONS: 500 mg doxycycline ; intra procedural ANESTHESIA/SEDATION: Moderate (conscious) sedation was employed during this procedure. A total of Versed  2 mg and Fentanyl  100 mcg was administered intravenously by the radiology nurse. Total intra-service moderate Sedation Time: 11 minutes. The patient's level of consciousness and vital signs were monitored continuously by radiology nursing throughout the procedure under my direct supervision. CONTRAST:  10 mL Omnipaque  300-administered into the  collecting system(s) FLUOROSCOPY: Radiation Exposure Index (as provided by the fluoroscopic device): 5 mGy Kerma COMPLICATIONS: None immediate. PROCEDURE: Informed written consent was obtained from the patient after a thorough discussion of the procedural risks, benefits and alternatives. All questions were addressed. Maximal Sterile Barrier Technique was utilized including caps, mask, sterile gowns, sterile gloves, sterile drape, hand hygiene and skin antiseptic. A timeout was performed prior to the initiation of the procedure. The indwelling catheter in the right hip region prepped and draped in usual sterile fashion. The indwelling 10 French catheter was aggressively irrigated with normal saline and flushed. Dilute contrast was then injected under fluoroscopic guidance into the catheter to evaluate if the fluid collection was communicating with vascular or lymphatic structure. Contrast flows freely into the catheter and outlines the fluid collection which is irregular in appearance, however there is no vascular or lymphatic communication identified. The contrast was then mostly withdrawn. The catheter and retention sutures were cut. A new 10 French catheter was then advanced over a guidewire into the fluid collection and repositioned in a more cephalad location. Approximately 500 mg of doxycycline  was reconstituted with 50 mL of normal saline and then approximately 35 mL of this mixture was injected into the 10 French pigtail catheter. The catheter was then capped. Retention suture and sterile dressing were applied. The doxycycline  will dwell within the cavity for approximately 1 hour and then the catheter will be connected to a JP bulb and the patient will be monitored for output. IMPRESSION: Satisfactory exchange of a 10 French right hip drainage catheter with sclerosing using doxycycline  as described above. Catheter will remain to a JP bulb until output has sufficiently diminished. Electronically Signed   By:  Cordella Banner   On: 10/10/2023 17:18   CT PELVIS WO CONTRAST Result Date: 10/07/2023 CLINICAL DATA:  Right hip drain evaluation. EXAM: CT PELVIS WITHOUT CONTRAST TECHNIQUE: Multidetector CT imaging of the pelvis was performed following the standard  protocol without intravenous contrast. RADIATION DOSE REDUCTION: This exam was performed according to the departmental dose-optimization program which includes automated exposure control, adjustment of the mA and/or kV according to patient size and/or use of iterative reconstruction technique. COMPARISON:  CT abdomen pelvis dated 07/08/2023. FINDINGS: Urinary Tract: The visualized ureters appear unremarkable. Punctate stone noted in the posterior bladder lumen. The urinary bladder is otherwise unremarkable. Bowel:  Moderate stool in the colon.  The appendix is normal. Vascular/Lymphatic: Moderate aortoiliac atherosclerotic disease. No pelvic adenopathy. Reproductive: The prostate and seminal vesicles are grossly unremarkable. Other: Right gluteal pigtail drainage catheter. Significant interval decrease in the size of the hematoma in the right gluteal muscle now measuring approximately 4.7 x 9.3 cm. Additional loculated collection in the anterior upper right thigh significantly decreased in size since the prior CT and measures approximately 10 x 17 cm in greatest axial dimensions. Musculoskeletal: Osteopenia with degenerative changes of the spine. Total left hip arthroplasty. Scattered lucent lesions throughout the bones as seen on the prior CT. IMPRESSION: 1. Right gluteal pigtail drainage catheter with significant interval decrease in the size of the hematoma in the right gluteal muscle. 2. Significant interval decrease in the size of the loculated collection in the anterior upper right thigh. 3. Punctate stone in the posterior bladder lumen. 4.  Aortic Atherosclerosis (ICD10-I70.0). Electronically Signed   By: Vanetta Chou M.D.   On: 10/07/2023 16:53   DG  CHEST PORT 1 VIEW Result Date: 10/06/2023 EXAM: 1 VIEW XRAY OF THE CHEST 10/06/2023 08:52:00 PM COMPARISON: 09/28/2023 CLINICAL HISTORY: 141880 SOB (shortness of breath) 141880. sob FINDINGS: LUNGS AND PLEURA: Increased interstitial markings, left greater than right, favoring atypical infection / pneumonia over interstitial edema. No pleural effusion. No pneumothorax. HEART AND MEDIASTINUM: No acute abnormality of the cardiac and mediastinal silhouettes. BONES AND SOFT TISSUES: Degenerative changes of the bilateral shoulders. No acute osseous abnormality. IMPRESSION: 1. Increased interstitial markings, left greater than right, favoring atypical infection/pneumonia over interstitial edema. Electronically signed by: Pinkie Pebbles MD 10/06/2023 08:58 PM EDT RP Workstation: [id number]   CT FEMUR RIGHT WO CONTRAST Result Date: 10/03/2023 CLINICAL DATA:  Right thigh fluid collection, drain maintenance EXAM: CT OF THE LOWER RIGHT EXTREMITY WITHOUT CONTRAST TECHNIQUE: Multidetector CT imaging of the right lower extremity was performed according to the standard protocol. RADIATION DOSE REDUCTION: This exam was performed according to the departmental dose-optimization program which includes automated exposure control, adjustment of the mA and/or kV according to patient size and/or use of iterative reconstruction technique. COMPARISON:  09/24/2023 FINDINGS: Bones/Joint/Cartilage Stable scattered bone lucencies, see description from the 09/24/2023 exam. Stable right hip arthropathy. Prominent osteoarthritis of the right knee. Left total knee prosthesis partially observed. Large spur like projections from the inferior patella extending into the joint. Moderate knee effusion possibly with synovitis. Suspected free osteochondral fragments in the knee joint. Ligaments N/A Muscles and Tendons Semimembranosus muscular atrophy bilaterally. Soft tissues Reduced size of the fluid collection along the superficial fascia margin of the  tensor fascia lata and rectus femoris, currently measuring about 20.9 by 3.0 by 11.8 cm (volume = 390 cm^3). Previous total volume cannot be calculated as this was not completely included, but previous total volume was probably about 3 times is much. Surrounding subcutaneous edema along the right lateral thigh tracking down to the knee. There is also some subcutaneous edema medially in both thighs. Edema tracks along the superficial fascia margin of the posterior calf musculature in the upper calf region. Prominence of stool in visualized colon, cannot  exclude constipation. Atherosclerosis noted. IMPRESSION: 1. Reduced size of the fluid collection along the superficial fascia margin of the tensor fascia lata and rectus femoris, currently measuring about 20.9 by 3.0 by 11.8 cm (volume = 390 cc). Previous total volume cannot be calculated as this was not completely included, but previous total volume was probably about 3 times is much. 2. Surrounding subcutaneous edema along the right lateral thigh tracking down to the knee. There is also some subcutaneous edema medially in both thighs. Edema tracks along the superficial fascia margin of the posterior calf musculature in the upper calf region. 3. Prominent osteoarthritis of the right knee. Moderate knee effusion possibly with synovitis. Suspected free osteochondral fragments in the knee joint. 4. Prominence of stool in visualized colon, cannot exclude constipation. 5. Atherosclerosis. 6. Stable scattered bone lucencies, see description from the 09/24/2023 exam. Electronically Signed   By: Ryan Salvage M.D.   On: 10/03/2023 08:21   VAS US  LOWER EXTREMITY VENOUS (DVT) Result Date: 10/01/2023  Lower Venous DVT Study Patient Name:  Frank [name].  Date of Exam:   10/01/2023 Medical Rec #: [id number]           Accession #:    [id number] Date of Birth: 06/10/1965           Patient Gender: M Patient Age:   60 years Exam Location:  [facility] Procedure:       VAS US  LOWER EXTREMITY VENOUS (DVT) Referring Phys: DELILIAH RASHID --------------------------------------------------------------------------------  Indications: Right thigh swelling, erythema, pain.  Risk Factors: Extended hospitalization with immobility (3 months). Limitations: Poor ultrasound/tissue interface. Comparison Study: LLEV on 07/18/2023 was negative for DVT. Performing Technologist: Ezzie Potters RVT, RDMS  Examination Guidelines: A complete evaluation includes B-mode imaging, spectral Doppler, color Doppler, and power Doppler as needed of all accessible portions of each vessel. Bilateral testing is considered an integral part of a complete examination. Limited examinations for reoccurring indications may be performed as noted. The reflux portion of the exam is performed with the patient in reverse Trendelenburg.  +---------+---------------+---------+-----------+----------+-------------------+ RIGHT    CompressibilityPhasicitySpontaneityPropertiesThrombus Aging      +---------+---------------+---------+-----------+----------+-------------------+ CFV      Full           Yes      Yes                                      +---------+---------------+---------+-----------+----------+-------------------+ SFJ      Full                                                             +---------+---------------+---------+-----------+----------+-------------------+ FV Prox  Full           Yes      Yes                                      +---------+---------------+---------+-----------+----------+-------------------+ FV Mid   Full           Yes      Yes                                      +---------+---------------+---------+-----------+----------+-------------------+  FV DistalFull           Yes      Yes                                      +---------+---------------+---------+-----------+----------+-------------------+ PFV      Full                                                              +---------+---------------+---------+-----------+----------+-------------------+ POP      Full           Yes      Yes                                      +---------+---------------+---------+-----------+----------+-------------------+ PTV      None           No       No                   Age indeterminate                                                         (one of paired)     +---------+---------------+---------+-----------+----------+-------------------+ PERO     Full                                                             +---------+---------------+---------+-----------+----------+-------------------+   +---------+---------------+---------+-----------+----------+--------------+ LEFT     CompressibilityPhasicitySpontaneityPropertiesThrombus Aging +---------+---------------+---------+-----------+----------+--------------+ CFV      None           No       No                   Acute          +---------+---------------+---------+-----------+----------+--------------+ SFJ      Partial                                      Acute          +---------+---------------+---------+-----------+----------+--------------+ FV Prox  Full           Yes      Yes                                 +---------+---------------+---------+-----------+----------+--------------+ FV Mid   Full           Yes      Yes                                 +---------+---------------+---------+-----------+----------+--------------+ FV DistalFull  Yes      Yes                                 +---------+---------------+---------+-----------+----------+--------------+ PFV      None           No       No                                  +---------+---------------+---------+-----------+----------+--------------+ POP      Full           Yes      Yes                                  +---------+---------------+---------+-----------+----------+--------------+ PTV      Full                                                        +---------+---------------+---------+-----------+----------+--------------+ PERO     Full                                                        +---------+---------------+---------+-----------+----------+--------------+ Proximal CFV compresses, has color, and phasic flow. Mid and distal CFV have occlusive thrombus    Summary: BILATERAL: -No evidence of popliteal cyst, bilaterally. RIGHT: - Findings consistent with age indeterminate deep vein thrombosis involving the right posterior tibial veins.   LEFT: - Findings consistent with acute deep vein thrombosis involving the left common femoral vein, SF junction, and left femoral vein.  - The common femoral vein obstruction does not appear to extend above inguinal ligament.  *See table(s) above for measurements and observations. Electronically signed by Gaile New MD on 10/01/2023 at 5:11:34 PM.    Final    IR GASTROSTOMY TUBE REMOVAL Result Date: 10/01/2023 INDICATION: Gastrostomy tube is no longer needed. EXAM: GASTROSTOMY TUBE REMOVAL MEDICATIONS: None ANESTHESIA/SEDATION: None CONTRAST:  None FLUOROSCOPY: None COMPLICATIONS: None immediate. PROCEDURE: Informed written consent was obtained from the patient after a thorough discussion of the procedural risks, benefits and alternatives. All questions were addressed. A timeout was performed prior to the initiation of the procedure. Balloon retention 18 French gastrostomy tube was intact. 5 cc syringe was used to remove 5 cc of saline from balloon. Gastrostomy tube was removed without difficulty. Site was cleaned and dressed appropriately. IMPRESSION: Successful removal of 18 French balloon retention gastrostomy tube. Performed by Carlin Griffon, PA-C Electronically Signed   By: Juliene Balder M.D.   On: 10/01/2023 16:40   US  RT LOWER EXTREM LTD SOFT TISSUE NON  VASCULAR Result Date: 09/30/2023 CLINICAL DATA:  Evaluate for abscess. EXAM: ULTRASOUND RIGHT LOWER EXTREMITY LIMITED TECHNIQUE: Ultrasound examination of the lower extremity soft tissues was performed in the area of clinical concern. COMPARISON:  None Available. FINDINGS: In the region of palpable abnormality in the lateral aspect of the right thigh there is a well-circumscribed is 8.5 x 2.3 x 6.8 cm fluid collection in the subcutaneous tissues. This is 1.8 cm deep  to the skin. There is no abnormal vascularity. There is no soft tissue mass. IMPRESSION: 8.5 cm fluid collection in the subcutaneous tissues of the lateral aspect of the right thigh. This may represent a seroma or abscess. Electronically Signed   By: Greig Pique M.D.   On: 09/30/2023 21:47   DG Knee 1-2 Views Right Result Date: 09/29/2023 CLINICAL DATA:  Knee pain and swelling EXAM: RIGHT KNEE - 1-2 VIEW COMPARISON:  X-ray 01/18/2019 FINDINGS: Moderate joint effusion. Moderate joint space loss of the medial compartment and patellofemoral joint. Tricompartmental osteophytes are seen greatest of the patellofemoral joint. Chondrocalcinosis also identified. There is medial translation of the femur relative to the tibia as well. Preserved bone mineralization. IMPRESSION: Advanced tricompartmental degenerative changes. Moderate joint effusion. Chondrocalcinosis Electronically Signed   By: Ranell Bring M.D.   On: 09/29/2023 15:57   DG Ribs Unilateral Left Result Date: 09/28/2023 CLINICAL DATA:  Left-sided rib pain after cough EXAM: LEFT RIBS - 2 VIEW COMPARISON:  Chest x-ray 09/28/2023.  Chest CT 07/26/2023. FINDINGS: Expansile anterolateral left 6 rib lesion is again noted. No displaced fractures are identified. Lungs are grossly clear. IMPRESSION: Expansile anterolateral left 6 rib lesion is again noted. Correlate for point tenderness. No displaced fractures are identified. Electronically Signed   By: Greig Pique M.D.   On: 09/28/2023 21:50   DG  CHEST PORT 1 VIEW Result Date: 09/28/2023 CLINICAL DATA:  Chest pain.  Attention left ribs. EXAM: PORTABLE CHEST 1 VIEW COMPARISON:  08/23/2023, CT 07/26/2023 FINDINGS: Removal of right-sided dialysis catheter and tracheostomy tube. Low lung volumes persist. Ill-defined opacity at the left lung base likely pleural effusion and airspace disease/atelectasis. Stable heart size and mediastinal contours. Vascular congestion. No pneumothorax. Chronic bilateral shoulder arthropathy. Lucent lesions within the thoracic osseous structures on prior CT, including left rib, not well demonstrated by radiograph. IMPRESSION: 1. Ill-defined opacity at the left lung base likely pleural effusion and airspace disease/atelectasis. 2. Vascular congestion. 3. Lucent lesions within the thoracic osseous structures on prior CT, including left rib, are not well demonstrated by radiograph. Electronically Signed   By: Andrea Gasman M.D.   On: 09/28/2023 16:40   US  IMAGE GUIDED FLUID DRAIN BY CATHETER Result Date: 09/26/2023 CLINICAL DATA:  Large right gluteal fluid collection likely evolving hematoma or potential abscess EXAM: US  IMAGE GUIDED FLUID DRAIN BY CATHETER TECHNIQUE: Ultrasound guidance COMPARISON:  None Available. FINDINGS: With the patient in a supine position, the right gluteal region and hip region were evaluated with ultrasound. A large fluid collection corresponding with previous visualized fluid collection is again seen. The patient was prepped and draped in usual sterile fashion. Local anesthesia was achieved by infiltrating subcutaneous tissue with 1% lidocaine . A small incision was made and then a 7 cm Yueh needle was advanced under ultrasound guidance from the incision to midpoint of the fluid collection. The needle was removed and short Amplatz wire was advanced under ultrasound guidance and coiled within the fluid collection. The Yueh catheter was then removed and the access site was dilated with a 10 French  fascial dilator. A 10 French pigtail catheter was then advanced over the guidewire and coiled within the fluid collection. Locking mechanism engaged. Retention suture and sterile dressing applied. Small sample was obtained and sent to pathology for aerobic and anaerobe evaluation. IMPRESSION: Satisfactory 10 French drainage catheter placed in a right hip abscess. Fluid appears to be infected hematoma. Electronically Signed   By: Cordella Banner   On: 09/26/2023 10:41   CT  HEAD WO CONTRAST ( ) Result Date: 09/25/2023 CLINICAL DATA:  Initial evaluation for acute neuro deficit, stroke suspected. EXAM: CT HEAD WITHOUT CONTRAST TECHNIQUE: Contiguous axial images were obtained from the base of the skull through the vertex without intravenous contrast. RADIATION DOSE REDUCTION: This exam was performed according to the departmental dose-optimization program which includes automated exposure control, adjustment of the mA and/or kV according to patient size and/or use of iterative reconstruction technique. COMPARISON:  CT from 09/22/2023. FINDINGS: Brain: Cerebral volume within normal limits. Patchy hypodensity involving the supratentorial cerebral white matter, most characteristic of chronic microvascular ischemic disease, moderate in nature. No acute intracranial hemorrhage. No acute large vessel territory infarct. No mass lesion, midline shift or mass effect. No hydrocephalus or extra-axial fluid collection. Vascular: No abnormal hyperdense vessel. Calcified atherosclerosis present at the skull base. Skull: Scalp soft tissues demonstrate no acute finding. Multiple lucent lesions again noted throughout the calvarium and visualized skull base, consistent with history of multiple myeloma. Sinuses/Orbits: Globes orbital soft tissues within normal limits. Mild mucosal thickening noted about the right maxillary sinus. Minimal pneumatized secretions noted within the right sphenoid sinus. Large right mastoid and middle ear  effusion, with small left mastoid effusion. Imaged nasopharynx grossly unremarkable. Other: None. IMPRESSION: 1. No acute intracranial abnormality. 2. Moderate chronic microvascular ischemic disease. 3. Multiple lucent lesions throughout the calvarium and visualized skull base, consistent with history of multiple myeloma. 4. Large right mastoid and middle ear effusion, with small left mastoid effusion. Findings of uncertain significance, however, correlation with physical exam for possible acute otomastoiditis recommended. Electronically Signed   By: Morene Hoard M.D.   On: 09/25/2023 20:46    There are no new results to review at this time.  Previous records (including but not limited to H&P, progress notes, nursing notes, TOC management) were reviewed in assessment of this patient.  Labs: CBC: Recent Labs  Lab 10/21/23 0630 10/22/23 0337 10/23/23 0310 10/24/23 0357 10/25/23 0355  WBC 6.1 9.0 6.5 5.9 5.6  NEUTROABS 3.4 7.0 4.0 3.4 3.2  HGB 9.1* 9.5* 9.5* 9.6* 9.4*  HCT 28.7* 30.2* 31.3* 31.7* 29.7*  MCV 100.0 99.7 100.3* 102.6* 99.0  PLT 369 371 338 317 353   Basic Metabolic Panel: Recent Labs  Lab 10/21/23 0339 10/22/23 0337 10/23/23 0310 10/24/23 0357 10/25/23 0355  NA 134* 132* 131* 132* 132*  K 4.3 4.4 4.3 4.4 4.5  CL 99 98 99 100 99  CO2 26 25 24 24 25   GLUCOSE 136* 189* 128* 89 94  BUN 45* 44* 49* 46* 45*  CREATININE 1.26* 1.27* 1.26* 1.24 1.19  CALCIUM  8.8* 8.3* 8.2* 8.5* 8.6*  MG  --   --  1.9 1.8  --    Liver Function Tests: Recent Labs  Lab 10/21/23 0339 10/22/23 0337 10/23/23 0310 10/24/23 0357 10/25/23 0355  AST 9* 11* 8* 9* 11*  ALT 6 7 7 7 7   ALKPHOS 62 60 63 60 64  BILITOT 0.5 0.3 0.4 0.3 <0.2  PROT 5.4* 5.2* 5.4* 5.2* 4.9*  ALBUMIN  2.2* 2.1* 2.3* 2.3* 2.2*   CBG: Recent Labs  Lab 10/24/23 1201 10/24/23 1607 10/24/23 2056 10/25/23 0732 10/25/23 1159  GLUCAP 127* 108* 122* 95 108*    Scheduled Meds:  (feeding supplement)  PROSource Plus  30 mL Oral TID BM   acyclovir   400 mg Oral BID   apixaban   5 mg Oral BID   collagenase    Topical Daily   docusate sodium   100 mg Oral BID  epoetin  alfa  40,000 Units Subcutaneous Q Wed-1800   feeding supplement  237 mL Oral BID WC   guaiFENesin   600 mg Oral BID   heparin  lock flush  500 Units Intravenous Once   hydrALAZINE   25 mg Oral Q8H   insulin  aspart  0-15 Units Subcutaneous TID AC & HS   insulin  aspart  2 Units Subcutaneous TID AC & HS   insulin  glargine-yfgn  13 Units Subcutaneous Q24H   lidocaine   1 patch Transdermal Q24H   montelukast   5 mg Oral QHS   multivitamin with minerals  1 tablet Oral Daily   nutrition supplement (JUVEN)  1 packet Oral BID AC   mouth rinse  15 mL Mouth Rinse 4 times per day   pantoprazole   40 mg Oral BID   PARoxetine   40 mg Oral Daily   senna  1 tablet Oral Daily   umeclidinium-vilanterol  1 puff Inhalation Daily   valproic  acid  750 mg Oral BID   verapamil   120 mg Oral Daily   Continuous Infusions:  sodium chloride  Stopped (10/13/23 0952)   sodium chloride  Stopped (08/28/23 0839)   sodium chloride      PRN Meds:.acetaminophen  (TYLENOL ) oral liquid 160 mg/5 mL **OR** acetaminophen , alum & mag hydroxide-simeth, benzonatate , cyclobenzaprine , HYDROmorphone  (DILAUDID ) injection, hydrOXYzine , ipratropium-albuterol , metoCLOPramide  **OR** metoCLOPramide  (REGLAN ) injection, ondansetron  (ZOFRAN ) IV, oxyCODONE , polyethylene glycol, artificial tears, sodium chloride  flush  Family Communication: None at bedside  Disposition: Status is: Inpatient Remains inpatient appropriate because: See above     Time spent: 33 minutes  Length of inpatient stay: 114 days  Author: Carliss LELON Canales, DO 10/25/2023 12:45 PM  For on call review [email or web address].

## 2023-10-26 DIAGNOSIS — D649 Anemia, unspecified: Secondary | ICD-10-CM | POA: Diagnosis not present

## 2023-10-26 DIAGNOSIS — S7011XA Contusion of right thigh, initial encounter: Secondary | ICD-10-CM | POA: Diagnosis not present

## 2023-10-26 DIAGNOSIS — C9000 Multiple myeloma not having achieved remission: Secondary | ICD-10-CM | POA: Diagnosis not present

## 2023-10-26 LAB — COMPREHENSIVE METABOLIC PANEL WITH GFR
ALT: 6 U/L (ref 0–44)
AST: 9 U/L — ABNORMAL LOW (ref 15–41)
Albumin: 2.2 g/dL — ABNORMAL LOW (ref 3.5–5.0)
Alkaline Phosphatase: 66 U/L (ref 38–126)
Anion gap: 8 (ref 5–15)
BUN: 43 mg/dL — ABNORMAL HIGH (ref 6–20)
CO2: 26 mmol/L (ref 22–32)
Calcium: 8.4 mg/dL — ABNORMAL LOW (ref 8.9–10.3)
Chloride: 99 mmol/L (ref 98–111)
Creatinine, Ser: 1.11 mg/dL (ref 0.61–1.24)
GFR, Estimated: >60 mL/min (ref >60)
Glucose, Bld: 120 mg/dL — ABNORMAL HIGH (ref 70–99)
Potassium: 4.5 mmol/L (ref 3.5–5.1)
Sodium: 133 mmol/L — ABNORMAL LOW (ref 135–145)
Total Bilirubin: 0.5 mg/dL (ref 0.0–1.2)
Total Protein: 5 g/dL — ABNORMAL LOW (ref 6.5–8.1)

## 2023-10-26 LAB — CBC WITH DIFFERENTIAL/PLATELET
Abs Immature Granulocytes: 0.04 K/uL (ref 0.00–0.07)
Basophils Absolute: 0 K/uL (ref 0.0–0.1)
Basophils Relative: 0 %
Eosinophils Absolute: 0.1 K/uL (ref 0.0–0.5)
Eosinophils Relative: 2 %
HCT: 29.2 % — ABNORMAL LOW (ref 39.0–52.0)
Hemoglobin: 9.3 g/dL — ABNORMAL LOW (ref 13.0–17.0)
Immature Granulocytes: 1 %
Lymphocytes Relative: 21 %
Lymphs Abs: 1.3 K/uL (ref 0.7–4.0)
MCH: 31 pg (ref 26.0–34.0)
MCHC: 31.8 g/dL (ref 30.0–36.0)
MCV: 97.3 fL (ref 80.0–100.0)
Monocytes Absolute: 0.8 K/uL (ref 0.1–1.0)
Monocytes Relative: 13 %
Neutro Abs: 3.8 K/uL (ref 1.7–7.7)
Neutrophils Relative %: 63 %
Platelets: 365 K/uL (ref 150–400)
RBC: 3 MIL/uL — ABNORMAL LOW (ref 4.22–5.81)
RDW: 18.6 % — ABNORMAL HIGH (ref 11.5–15.5)
WBC: 6 K/uL (ref 4.0–10.5)
nRBC: 0.7 % — ABNORMAL HIGH (ref 0.0–0.2)

## 2023-10-26 LAB — GLUCOSE, CAPILLARY
Glucose-Capillary: 146 mg/dL — ABNORMAL HIGH (ref 70–99)
Glucose-Capillary: 123 mg/dL — ABNORMAL HIGH (ref 70–99)
Glucose-Capillary: 128 mg/dL — ABNORMAL HIGH (ref 70–99)
Glucose-Capillary: 138 mg/dL — ABNORMAL HIGH (ref 70–99)

## 2023-10-26 NOTE — Progress Notes (Signed)
 Progress Note   Patient: Frank [name]. FMW:[id number] DOB: Mar 19, 1965 DOA: 07/03/2023  DOS: the patient was seen and examined on 10/26/2023   Brief hospital course:  58 year old PMH OSA CPAP, pathological fracture and multiple ORIF left Humerus likely due to multiple myeloma, A-fib not on anticoagulation, hyperlipidemia, tobacco use disorder who was feeling weak for 1 week, admitted to [facility] with concern of sepsis, community-acquired pneumonia and acute encephalopathy. Patient was found to have lytic lesions, he was transferred to [facility] for oncology treatment. In the meantime he developed encephalopathy, acute renal failure. He remained in the ICU for 36 days. Underwent tracheostomy. He was also started on HD after CRRT,off of HD now. Subsequently PEG tube placed 5/29 and IR drain placement of right thigh hip infected hematoma on 7/11 . S/p PEG tube removal. Needs Port-A-Cath placement (for outpatient chemo), either during this hospitalization or as an outpatient. F/u IR recommendations for drain management. Started on heparin  drip on 7/17 because of b/l LE DVT.  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 on 7/28.  Surgical EBL 750 cc. Oncology and IR following.  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  Wound VAC removed 8/1.  Dressing changes, Eliquis  per Ortho.  Infected right flank/gluteal hematoma - S/p IR drain placement 7/11.  Drain exchanged 7/25.  Completed antibiotic course 7/26.  IR following, possible drain removal Monday or just before discharge.  Concern for sepsis - Completed vancomycin  6/28.  Completed Zyvox  7/26.  Multiple myeloma - Port-A-Cath placed 7/30 by IR.  Started chemo with Velcade /Cytoxan  on 08/21/2023. Chemo changed to Sarclisa /Kyprolis /Cytoxan  later on.  Managed closely by oncology.  Oncology to work on setting up  outpatient chemotherapy in [place] upon discharge.  Acute kidney injury on CKD 3A - Secondary to multiple myeloma.  Briefly required HD.  AKI resolved, TDC removed.  Bilateral lower extremity DVT - Eliquis  on board.  Pt will be on life long anticoagulation.  Aspergillosis with pneumonia - Completed voriconazole  8/5.  Acute hypoxic respiratory failure - Trach removed 6/11, decannulated.  Resolved.  Acute metabolic encephalopathy-resolved  Anemia of chronic renal disease - Hemoglobin stable.  Insulin -dependent diabetes mellitus - Continue insulin  sliding scale.  Paroxysmal atrial fibrillation - Rate well-controlled.  Continue verapamil , Eliquis .  Obstructive sleep apnea - CPAP on board.  Dysphagia - Resolved.  PEG tube removed.  On regular diet.  Severe protein calorie malnutrition - Dietary on board.  Goals of care -Working closely with TOC, pt, on disposition planning.  Looking SNF in [place], awaiting bed availability and insurance auth.   Subjective: Patient resting comfortably this morning.  Denies any fever, chills, chest pain, nausea, vomiting, abdominal pain.  In good spirits.  Physical Exam:  Vitals:   10/25/23 1546 10/25/23 1935 10/26/23 0428 10/26/23 0830  BP: (!) 128/94 (!) 122/97 (!) 128/97 117/82  Pulse: 88 90 89 97  Resp: 20  16 18   Temp: 97.9 F (36.6 C) 98.1 F (36.7 C) 98 F (36.7 C) 98.5 F (36.9 C)  TempSrc: Oral Oral Oral Oral  SpO2: 98% 98% 98% 94%  Weight:      Height:        GENERAL:  Alert, pleasant, no acute distress  HEENT:  EOMI CARDIOVASCULAR:  RRR, no murmurs appreciated RESPIRATORY:  Clear to auscultation, no wheezing, rales, or rhonchi GASTROINTESTINAL:  Soft, nontender, nondistended EXTREMITIES: L UE  above elbow amputation, drain in right hip NEURO:  No new focal deficits appreciated SKIN:  No rashes noted PSYCH:  Appropriate mood and affect     Data Reviewed:  Imaging Studies: IR SCLEROTHERAPY OF A FLUID  COLLECTION Result Date: 10/23/2023 INDICATION: Right hip fluid collection continued. Previous doxycycline  sclerosis performed 10/10/2023. Output still persisting although diminished. Planned second and final attempted sclerosing fluid collection. EXAM: Drain change MEDICATIONS: The patient is currently admitted to the hospital and receiving intravenous antibiotics. The antibiotics were administered within an appropriate time frame prior to the initiation of the procedure. ANESTHESIA/SEDATION: Moderate (conscious) sedation was employed during this procedure. A total of Versed  2 mg and Fentanyl  150 mcg was administered intravenously by the radiology nurse. Total intra-service moderate Sedation Time: 25 mL Omnipaque  3 minutes. The patient's level of consciousness and vital signs were monitored continuously by radiology nursing throughout the procedure under my direct supervision. COMPLICATIONS: None immediate. PROCEDURE: Informed written consent was obtained from the patient after a thorough discussion of the procedural risks, benefits and alternatives. All questions were addressed. Maximal Sterile Barrier Technique was utilized including caps, mask, sterile gowns, sterile gloves, sterile drape, hand hygiene and skin antiseptic. A timeout was performed prior to the initiation of the procedure. With the patient in a supine position, external portion of drainage in the right flank were prepped and draped in the usual sterile. Aggressive flushing was normal saline. Significant decrease in overall size the when to previous. When all of flushing began to return mostly clear rest of pocket was evacuated. Short Amplatz wire was then advanced through the catheter and the catheter was completely patient. New 10 catheter was then advanced over the guidewire coiled more medially. Approximately 40 mL of reconstituted doxycycline  was then injected into the catheter and the catheter was capped. Retention suture and sterile dressing  applied. The catheter will be reopened to suction approximately 2 hours. That time there will be no more flushing of the catheter. IMPRESSION: Repeat sclerosing right flank cavity using combination fluoroscopy and aggressive flushing with doxycycline . Electronically Signed   By: Cordella Banner   On: 10/23/2023 16:23   IR Catheter Tube Change Result Date: 10/23/2023 INDICATION: Right hip fluid collection continued. Previous doxycycline  sclerosis performed 10/10/2023. Output still persisting although diminished. Planned second and final attempted sclerosing fluid collection. EXAM: Drain change MEDICATIONS: The patient is currently admitted to the hospital and receiving intravenous antibiotics. The antibiotics were administered within an appropriate time frame prior to the initiation of the procedure. ANESTHESIA/SEDATION: Moderate (conscious) sedation was employed during this procedure. A total of Versed  2 mg and Fentanyl  150 mcg was administered intravenously by the radiology nurse. Total intra-service moderate Sedation Time: 25 mL Omnipaque  3 minutes. The patient's level of consciousness and vital signs were monitored continuously by radiology nursing throughout the procedure under my direct supervision. COMPLICATIONS: None immediate. PROCEDURE: Informed written consent was obtained from the patient after a thorough discussion of the procedural risks, benefits and alternatives. All questions were addressed. Maximal Sterile Barrier Technique was utilized including caps, mask, sterile gowns, sterile gloves, sterile drape, hand hygiene and skin antiseptic. A timeout was performed prior to the initiation of the procedure. With the patient in a supine position, external portion of drainage in the right flank were prepped and draped in the usual sterile. Aggressive flushing was normal saline. Significant decrease in overall size the when to previous. When all of flushing began to return mostly clear rest of pocket was  evacuated. Short Amplatz wire  was then advanced through the catheter and the catheter was completely patient. New 10 catheter was then advanced over the guidewire coiled more medially. Approximately 40 mL of reconstituted doxycycline  was then injected into the catheter and the catheter was capped. Retention suture and sterile dressing applied. The catheter will be reopened to suction approximately 2 hours. That time there will be no more flushing of the catheter. IMPRESSION: Repeat sclerosing right flank cavity using combination fluoroscopy and aggressive flushing with doxycycline . Electronically Signed   By: Cordella Banner   On: 10/23/2023 16:23   IR IMAGING GUIDED PORT INSERTION Result Date: 10/15/2023 INDICATION: 58 year old male with history multiple myeloma requiring central venous access for chemotherapy administration EXAM: IMPLANTED PORT A CATH PLACEMENT WITH ULTRASOUND AND FLUOROSCOPIC GUIDANCE COMPARISON:  None Available. MEDICATIONS: None. ANESTHESIA/SEDATION: Moderate (conscious) sedation was employed during this procedure. A total of Versed  1.5 mg and Fentanyl  75 mcg was administered intravenously. Moderate Sedation Time: 14 minutes. The patient's level of consciousness and vital signs were monitored continuously by radiology nursing throughout the procedure under my direct supervision. CONTRAST:  None FLUOROSCOPY TIME:  Ten mGy reference air kerma COMPLICATIONS: None immediate. PROCEDURE: The procedure, risks, benefits, and alternatives were explained to the patient. Questions regarding the procedure were encouraged and answered. The patient understands and consents to the procedure. The right neck and chest were prepped with chlorhexidine  in a sterile fashion, and a sterile drape was applied covering the operative field. Maximum barrier sterile technique with sterile gowns and gloves were used for the procedure. A timeout was performed prior to the initiation of the procedure. Ultrasound was used  to examine the jugular vein which was compressible and free of internal echoes. A skin marker was used to demarcate the planned venotomy and port pocket incision sites. Local anesthesia was provided to these sites and the subcutaneous tunnel track with 1% lidocaine  with 1:100,000 epinephrine . A small incision was created at the jugular access site and blunt dissection was performed of the subcutaneous tissues. Under ultrasound guidance, the jugular vein was accessed with a 21 ga micropuncture needle and an 0.018 wire was inserted to the superior vena cava. Real-time ultrasound guidance was utilized for vascular access including the acquisition of a permanent ultrasound image documenting patency of the accessed vessel. A 5 Fr micopuncture set was then used, through which a 0.035 Rosen wire was passed under fluoroscopic guidance into the inferior vena cava. An 8 Fr dilator was then placed over the wire. A subcutaneous port pocket was then created along the upper chest wall utilizing a combination of sharp and blunt dissection. The pocket was irrigated with sterile saline, packed with gauze, and observed for hemorrhage. A single lumen ISP sized power injectable port was chosen for placement. The 8 Fr catheter was tunneled from the port pocket site to the venotomy incision. The port was placed in the pocket. The external catheter was trimmed to appropriate length. The dilator was exchanged for an 8 Fr peel-away sheath under fluoroscopic guidance. The catheter was then placed through the sheath and the sheath was removed. Final catheter positioning was confirmed and documented with a fluoroscopic spot radiograph. The port was accessed with a Huber needle, aspirated, and flushed with heparinized saline. The deep dermal layer of the port pocket incision was closed with interrupted 3-0 Vicryl suture. Dermabond was then placed over the port pocket and neck incisions. The patient tolerated the procedure well without  immediate post procedural complication. FINDINGS: After catheter placement, the tip lies within the  superior cavoatrial junction. The catheter aspirates and flushes normally and is ready for immediate use. IMPRESSION: Successful placement of a power injectable Port-A-Cath via the right internal jugular vein. The catheter is ready for immediate use. Ester Sides, MD [facility] Electronically Signed   By: Ester Sides M.D.   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 Multiple myeloma. EXAM: LEFT HUMERUS - 2+ VIEW COMPARISON:  09/15/2023 FINDINGS: A single C-arm image demonstrates the recently demonstrated lytic changes in the distal humerus and associated fixation hardware. IMPRESSION: Single C-arm image of the distal humerus and associated fixation hardware. Electronically Signed   By: Elspeth Bathe M.D.   On: 10/13/2023 11:05   DG C-Arm 1-60 Min-No Report Result Date: 10/13/2023 Fluoroscopy was utilized by the requesting physician.  No radiographic interpretation.   DG C-Arm 1-60 Min-No Report Result Date: 10/13/2023 Fluoroscopy was utilized by the requesting physician.  No radiographic interpretation.   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 Planned sclerosing. EXAM: Sclerosing of abnormal fluid collection COMPARISON:  None Available. MEDICATIONS: 500 mg doxycycline ; intra procedural ANESTHESIA/SEDATION: Moderate (conscious) sedation was employed during this procedure. A total of Versed  2 mg and Fentanyl  100 mcg was administered intravenously by the radiology nurse. Total intra-service moderate Sedation Time: 11 minutes. The patient's level of consciousness and vital signs were monitored continuously by radiology nursing throughout the procedure under  my direct supervision. CONTRAST:  10 mL Omnipaque  300-administered into the collecting system(s) FLUOROSCOPY: Radiation Exposure Index (as provided by the fluoroscopic device): 5 mGy Kerma COMPLICATIONS: None immediate. PROCEDURE: Informed written consent was obtained from the patient after a thorough discussion of the procedural risks, benefits and alternatives. All questions were addressed. Maximal Sterile Barrier Technique was utilized including caps, mask, sterile gowns, sterile gloves, sterile drape, hand hygiene and skin antiseptic. A timeout was performed prior to the initiation of the procedure. The indwelling catheter in the right hip region prepped and draped in usual sterile fashion. The indwelling 10 French catheter was aggressively irrigated with normal saline and flushed. Dilute contrast was then injected under fluoroscopic guidance into the catheter to evaluate if the fluid collection was communicating with vascular or lymphatic structure. Contrast flows freely into the catheter and outlines the fluid collection which is irregular in appearance, however there is no vascular or lymphatic communication identified. The contrast was then mostly withdrawn. The catheter and retention sutures were cut. A new 10 French catheter was then advanced over a guidewire into the fluid collection and repositioned in a more cephalad location. Approximately 500 mg of doxycycline  was reconstituted with 50 mL of normal saline and then approximately 35 mL of this mixture was injected into the 10 French pigtail catheter. The catheter was then capped. Retention suture and sterile dressing were applied. The doxycycline  will dwell within the cavity for approximately 1 hour and then the catheter will be connected to a JP bulb and the patient will be monitored for output. IMPRESSION: Satisfactory exchange of a 10 French right hip drainage catheter with sclerosing using doxycycline  as described above. Catheter will remain to a JP  bulb until output has sufficiently diminished. Electronically Signed   By: Cordella Banner   On: 10/10/2023 17:18   CT PELVIS WO CONTRAST Result Date: 10/07/2023 CLINICAL DATA:  Right hip drain evaluation. EXAM: CT PELVIS WITHOUT CONTRAST TECHNIQUE:  Multidetector CT imaging of the pelvis was performed following the standard protocol without intravenous contrast. RADIATION DOSE REDUCTION: This exam was performed according to the departmental dose-optimization program which includes automated exposure control, adjustment of the mA and/or kV according to patient size and/or use of iterative reconstruction technique. COMPARISON:  CT abdomen pelvis dated 07/08/2023. FINDINGS: Urinary Tract: The visualized ureters appear unremarkable. Punctate stone noted in the posterior bladder lumen. The urinary bladder is otherwise unremarkable. Bowel:  Moderate stool in the colon.  The appendix is normal. Vascular/Lymphatic: Moderate aortoiliac atherosclerotic disease. No pelvic adenopathy. Reproductive: The prostate and seminal vesicles are grossly unremarkable. Other: Right gluteal pigtail drainage catheter. Significant interval decrease in the size of the hematoma in the right gluteal muscle now measuring approximately 4.7 x 9.3 cm. Additional loculated collection in the anterior upper right thigh significantly decreased in size since the prior CT and measures approximately 10 x 17 cm in greatest axial dimensions. Musculoskeletal: Osteopenia with degenerative changes of the spine. Total left hip arthroplasty. Scattered lucent lesions throughout the bones as seen on the prior CT. IMPRESSION: 1. Right gluteal pigtail drainage catheter with significant interval decrease in the size of the hematoma in the right gluteal muscle. 2. Significant interval decrease in the size of the loculated collection in the anterior upper right thigh. 3. Punctate stone in the posterior bladder lumen. 4.  Aortic Atherosclerosis (ICD10-I70.0).  Electronically Signed   By: Vanetta Chou M.D.   On: 10/07/2023 16:53   DG CHEST PORT 1 VIEW Result Date: 10/06/2023 EXAM: 1 VIEW XRAY OF THE CHEST 10/06/2023 08:52:00 PM COMPARISON: 09/28/2023 CLINICAL HISTORY: 141880 SOB (shortness of breath) 141880. sob FINDINGS: LUNGS AND PLEURA: Increased interstitial markings, left greater than right, favoring atypical infection / pneumonia over interstitial edema. No pleural effusion. No pneumothorax. HEART AND MEDIASTINUM: No acute abnormality of the cardiac and mediastinal silhouettes. BONES AND SOFT TISSUES: Degenerative changes of the bilateral shoulders. No acute osseous abnormality. IMPRESSION: 1. Increased interstitial markings, left greater than right, favoring atypical infection/pneumonia over interstitial edema. Electronically signed by: Pinkie Pebbles MD 10/06/2023 08:58 PM EDT RP Workstation: [id number]   CT FEMUR RIGHT WO CONTRAST Result Date: 10/03/2023 CLINICAL DATA:  Right thigh fluid collection, drain maintenance EXAM: CT OF THE LOWER RIGHT EXTREMITY WITHOUT CONTRAST TECHNIQUE: Multidetector CT imaging of the right lower extremity was performed according to the standard protocol. RADIATION DOSE REDUCTION: This exam was performed according to the departmental dose-optimization program which includes automated exposure control, adjustment of the mA and/or kV according to patient size and/or use of iterative reconstruction technique. COMPARISON:  09/24/2023 FINDINGS: Bones/Joint/Cartilage Stable scattered bone lucencies, see description from the 09/24/2023 exam. Stable right hip arthropathy. Prominent osteoarthritis of the right knee. Left total knee prosthesis partially observed. Large spur like projections from the inferior patella extending into the joint. Moderate knee effusion possibly with synovitis. Suspected free osteochondral fragments in the knee joint. Ligaments N/A Muscles and Tendons Semimembranosus muscular atrophy bilaterally. Soft tissues  Reduced size of the fluid collection along the superficial fascia margin of the tensor fascia lata and rectus femoris, currently measuring about 20.9 by 3.0 by 11.8 cm (volume = 390 cm^3). Previous total volume cannot be calculated as this was not completely included, but previous total volume was probably about 3 times is much. Surrounding subcutaneous edema along the right lateral thigh tracking down to the knee. There is also some subcutaneous edema medially in both thighs. Edema tracks along the superficial fascia margin of the posterior calf musculature in  the upper calf region. Prominence of stool in visualized colon, cannot exclude constipation. Atherosclerosis noted. IMPRESSION: 1. Reduced size of the fluid collection along the superficial fascia margin of the tensor fascia lata and rectus femoris, currently measuring about 20.9 by 3.0 by 11.8 cm (volume = 390 cc). Previous total volume cannot be calculated as this was not completely included, but previous total volume was probably about 3 times is much. 2. Surrounding subcutaneous edema along the right lateral thigh tracking down to the knee. There is also some subcutaneous edema medially in both thighs. Edema tracks along the superficial fascia margin of the posterior calf musculature in the upper calf region. 3. Prominent osteoarthritis of the right knee. Moderate knee effusion possibly with synovitis. Suspected free osteochondral fragments in the knee joint. 4. Prominence of stool in visualized colon, cannot exclude constipation. 5. Atherosclerosis. 6. Stable scattered bone lucencies, see description from the 09/24/2023 exam. Electronically Signed   By: Ryan Salvage M.D.   On: 10/03/2023 08:21   VAS US  LOWER EXTREMITY VENOUS (DVT) Result Date: 10/01/2023  Lower Venous DVT Study Patient Name:  Frank [name].  Date of Exam:   10/01/2023 Medical Rec #: [id number]           Accession #:    [id number] Date of Birth: [birth year]           Patient  Gender: M Patient Age:   40 years Exam Location:  [facility] Procedure:      VAS US  LOWER EXTREMITY VENOUS (DVT) Referring Phys: DELILIAH RASHID --------------------------------------------------------------------------------  Indications: Right thigh swelling, erythema, pain.  Risk Factors: Extended hospitalization with immobility (3 months). Limitations: Poor ultrasound/tissue interface. Comparison Study: LLEV on 07/18/2023 was negative for DVT. Performing Technologist: Ezzie Potters RVT, RDMS  Examination Guidelines: A complete evaluation includes B-mode imaging, spectral Doppler, color Doppler, and power Doppler as needed of all accessible portions of each vessel. Bilateral testing is considered an integral part of a complete examination. Limited examinations for reoccurring indications may be performed as noted. The reflux portion of the exam is performed with the patient in reverse Trendelenburg.  +---------+---------------+---------+-----------+----------+-------------------+ RIGHT    CompressibilityPhasicitySpontaneityPropertiesThrombus Aging      +---------+---------------+---------+-----------+----------+-------------------+ CFV      Full           Yes      Yes                                      +---------+---------------+---------+-----------+----------+-------------------+ SFJ      Full                                                             +---------+---------------+---------+-----------+----------+-------------------+ FV Prox  Full           Yes      Yes                                      +---------+---------------+---------+-----------+----------+-------------------+ FV Mid   Full           Yes      Yes                                      +---------+---------------+---------+-----------+----------+-------------------+  FV DistalFull           Yes      Yes                                       +---------+---------------+---------+-----------+----------+-------------------+ PFV      Full                                                             +---------+---------------+---------+-----------+----------+-------------------+ POP      Full           Yes      Yes                                      +---------+---------------+---------+-----------+----------+-------------------+ PTV      None           No       No                   Age indeterminate                                                         (one of paired)     +---------+---------------+---------+-----------+----------+-------------------+ PERO     Full                                                             +---------+---------------+---------+-----------+----------+-------------------+   +---------+---------------+---------+-----------+----------+--------------+ LEFT     CompressibilityPhasicitySpontaneityPropertiesThrombus Aging +---------+---------------+---------+-----------+----------+--------------+ CFV      None           No       No                   Acute          +---------+---------------+---------+-----------+----------+--------------+ SFJ      Partial                                      Acute          +---------+---------------+---------+-----------+----------+--------------+ FV Prox  Full           Yes      Yes                                 +---------+---------------+---------+-----------+----------+--------------+ FV Mid   Full           Yes      Yes                                 +---------+---------------+---------+-----------+----------+--------------+ FV DistalFull  Yes      Yes                                 +---------+---------------+---------+-----------+----------+--------------+ PFV      None           No       No                                   +---------+---------------+---------+-----------+----------+--------------+ POP      Full           Yes      Yes                                 +---------+---------------+---------+-----------+----------+--------------+ PTV      Full                                                        +---------+---------------+---------+-----------+----------+--------------+ PERO     Full                                                        +---------+---------------+---------+-----------+----------+--------------+ Proximal CFV compresses, has color, and phasic flow. Mid and distal CFV have occlusive thrombus    Summary: BILATERAL: -No evidence of popliteal cyst, bilaterally. RIGHT: - Findings consistent with age indeterminate deep vein thrombosis involving the right posterior tibial veins.   LEFT: - Findings consistent with acute deep vein thrombosis involving the left common femoral vein, SF junction, and left femoral vein.  - The common femoral vein obstruction does not appear to extend above inguinal ligament.  *See table(s) above for measurements and observations. Electronically signed by Gaile New MD on 10/01/2023 at 5:11:34 PM.    Final    IR GASTROSTOMY TUBE REMOVAL Result Date: 10/01/2023 INDICATION: Gastrostomy tube is no longer needed. EXAM: GASTROSTOMY TUBE REMOVAL MEDICATIONS: None ANESTHESIA/SEDATION: None CONTRAST:  None FLUOROSCOPY: None COMPLICATIONS: None immediate. PROCEDURE: Informed written consent was obtained from the patient after a thorough discussion of the procedural risks, benefits and alternatives. All questions were addressed. A timeout was performed prior to the initiation of the procedure. Balloon retention 18 French gastrostomy tube was intact. 5 cc syringe was used to remove 5 cc of saline from balloon. Gastrostomy tube was removed without difficulty. Site was cleaned and dressed appropriately. IMPRESSION: Successful removal of 18 French balloon retention  gastrostomy tube. Performed by Carlin Griffon, PA-C Electronically Signed   By: Juliene Balder M.D.   On: 10/01/2023 16:40   US  RT LOWER EXTREM LTD SOFT TISSUE NON VASCULAR Result Date: 09/30/2023 CLINICAL DATA:  Evaluate for abscess. EXAM: ULTRASOUND RIGHT LOWER EXTREMITY LIMITED TECHNIQUE: Ultrasound examination of the lower extremity soft tissues was performed in the area of clinical concern. COMPARISON:  None Available. FINDINGS: In the region of palpable abnormality in the lateral aspect of the right thigh there is a well-circumscribed is 8.5 x 2.3 x 6.8 cm fluid collection in the subcutaneous tissues. This is 1.8 cm deep  to the skin. There is no abnormal vascularity. There is no soft tissue mass. IMPRESSION: 8.5 cm fluid collection in the subcutaneous tissues of the lateral aspect of the right thigh. This may represent a seroma or abscess. Electronically Signed   By: Greig Pique M.D.   On: 09/30/2023 21:47   DG Knee 1-2 Views Right Result Date: 09/29/2023 CLINICAL DATA:  Knee pain and swelling EXAM: RIGHT KNEE - 1-2 VIEW COMPARISON:  X-ray 01/18/2019 FINDINGS: Moderate joint effusion. Moderate joint space loss of the medial compartment and patellofemoral joint. Tricompartmental osteophytes are seen greatest of the patellofemoral joint. Chondrocalcinosis also identified. There is medial translation of the femur relative to the tibia as well. Preserved bone mineralization. IMPRESSION: Advanced tricompartmental degenerative changes. Moderate joint effusion. Chondrocalcinosis Electronically Signed   By: Ranell Bring M.D.   On: 09/29/2023 15:57   DG Ribs Unilateral Left Result Date: 09/28/2023 CLINICAL DATA:  Left-sided rib pain after cough EXAM: LEFT RIBS - 2 VIEW COMPARISON:  Chest x-ray 09/28/2023.  Chest CT 07/26/2023. FINDINGS: Expansile anterolateral left 6 rib lesion is again noted. No displaced fractures are identified. Lungs are grossly clear. IMPRESSION: Expansile anterolateral left 6 rib lesion is  again noted. Correlate for point tenderness. No displaced fractures are identified. Electronically Signed   By: Greig Pique M.D.   On: 09/28/2023 21:50   DG CHEST PORT 1 VIEW Result Date: 09/28/2023 CLINICAL DATA:  Chest pain.  Attention left ribs. EXAM: PORTABLE CHEST 1 VIEW COMPARISON:  08/23/2023, CT 07/26/2023 FINDINGS: Removal of right-sided dialysis catheter and tracheostomy tube. Low lung volumes persist. Ill-defined opacity at the left lung base likely pleural effusion and airspace disease/atelectasis. Stable heart size and mediastinal contours. Vascular congestion. No pneumothorax. Chronic bilateral shoulder arthropathy. Lucent lesions within the thoracic osseous structures on prior CT, including left rib, not well demonstrated by radiograph. IMPRESSION: 1. Ill-defined opacity at the left lung base likely pleural effusion and airspace disease/atelectasis. 2. Vascular congestion. 3. Lucent lesions within the thoracic osseous structures on prior CT, including left rib, are not well demonstrated by radiograph. Electronically Signed   By: Andrea Gasman M.D.   On: 09/28/2023 16:40    There are no new results to review at this time.  Previous records (including but not limited to H&P, progress notes, nursing notes, TOC management) were reviewed in assessment of this patient.  Labs: CBC: Recent Labs  Lab 10/22/23 0337 10/23/23 0310 10/24/23 0357 10/25/23 0355 10/26/23 0319  WBC 9.0 6.5 5.9 5.6 6.0  NEUTROABS 7.0 4.0 3.4 3.2 3.8  HGB 9.5* 9.5* 9.6* 9.4* 9.3*  HCT 30.2* 31.3* 31.7* 29.7* 29.2*  MCV 99.7 100.3* 102.6* 99.0 97.3  PLT 371 338 317 353 365   Basic Metabolic Panel: Recent Labs  Lab 10/22/23 0337 10/23/23 0310 10/24/23 0357 10/25/23 0355 10/26/23 0319  NA 132* 131* 132* 132* 133*  K 4.4 4.3 4.4 4.5 4.5  CL 98 99 100 99 99  CO2 25 24 24 25 26   GLUCOSE 189* 128* 89 94 120*  BUN 44* 49* 46* 45* 43*  CREATININE 1.27* 1.26* 1.24 1.19 1.11  CALCIUM  8.3* 8.2* 8.5*  8.6* 8.4*  MG  --  1.9 1.8  --   --    Liver Function Tests: Recent Labs  Lab 10/22/23 0337 10/23/23 0310 10/24/23 0357 10/25/23 0355 10/26/23 0319  AST 11* 8* 9* 11* 9*  ALT 7 7 7 7 6   ALKPHOS 60 63 60 64 66  BILITOT 0.3 0.4 0.3 <0.2 0.5  PROT 5.2* 5.4* 5.2* 4.9* 5.0*  ALBUMIN  2.1* 2.3* 2.3* 2.2* 2.2*   CBG: Recent Labs  Lab 10/25/23 1159 10/25/23 1547 10/25/23 2057 10/26/23 0801 10/26/23 1146  GLUCAP 108* 136* 175* 146* 123*    Scheduled Meds:  (feeding supplement) PROSource Plus  30 mL Oral TID BM   acyclovir   400 mg Oral BID   apixaban   5 mg Oral BID   collagenase    Topical Daily   docusate sodium   100 mg Oral BID   epoetin  alfa  40,000 Units Subcutaneous Q Wed-1800   feeding supplement  237 mL Oral BID WC   guaiFENesin   600 mg Oral BID   heparin  lock flush  500 Units Intravenous Once   hydrALAZINE   25 mg Oral Q8H   insulin  aspart  0-15 Units Subcutaneous TID AC & HS   insulin  aspart  2 Units Subcutaneous TID AC & HS   insulin  glargine-yfgn  13 Units Subcutaneous Q24H   lidocaine   1 patch Transdermal Q24H   montelukast   5 mg Oral QHS   multivitamin with minerals  1 tablet Oral Daily   nutrition supplement (JUVEN)  1 packet Oral BID AC   mouth rinse  15 mL Mouth Rinse 4 times per day   pantoprazole   40 mg Oral BID   PARoxetine   40 mg Oral Daily   senna  1 tablet Oral Daily   umeclidinium-vilanterol  1 puff Inhalation Daily   valproic  acid  750 mg Oral BID   verapamil   120 mg Oral Daily   Continuous Infusions:  sodium chloride  Stopped (10/13/23 0952)   sodium chloride  Stopped (08/28/23 0839)   sodium chloride      PRN Meds:.acetaminophen  (TYLENOL ) oral liquid 160 mg/5 mL **OR** acetaminophen , alum & mag hydroxide-simeth, benzonatate , cyclobenzaprine , HYDROmorphone  (DILAUDID ) injection, hydrOXYzine , ipratropium-albuterol , metoCLOPramide  **OR** metoCLOPramide  (REGLAN ) injection, ondansetron  (ZOFRAN ) IV, oxyCODONE , polyethylene glycol, artificial tears,  sodium chloride  flush  Family Communication: None at bedside  Disposition: Status is: Inpatient Remains inpatient appropriate because: See above     Time spent: 30 minutes  Length of inpatient stay: 115 days  Author: Carliss LELON Canales, DO 10/26/2023 2:11 PM  For on call review [email or web address].

## 2023-10-26 NOTE — Plan of Care (Signed)
 Problem: Education: Goal: Knowledge of General Education information will improve Description: Including pain rating scale, medication(s)/side effects and non-pharmacologic comfort measures 10/26/2023 0443 by Jackquline Leonette CROME, RN Outcome: Progressing 10/26/2023 0443 by Jackquline Leonette CROME, RN Outcome: Progressing   Problem: Health Behavior/Discharge Planning: Goal: Ability to manage health-related needs will improve 10/26/2023 0443 by Jackquline Leonette CROME, RN Outcome: Progressing 10/26/2023 0443 by Jackquline Leonette CROME, RN Outcome: Progressing   Problem: Clinical Measurements: Goal: Ability to maintain clinical measurements within normal limits will improve 10/26/2023 0443 by Jackquline Leonette CROME, RN Outcome: Progressing 10/26/2023 0443 by Jackquline Leonette CROME, RN Outcome: Progressing Goal: Will remain free from infection 10/26/2023 0443 by Jackquline Leonette CROME, RN Outcome: Progressing 10/26/2023 0443 by Jackquline Leonette CROME, RN Outcome: Progressing Goal: Diagnostic test results will improve 10/26/2023 0443 by Jackquline Leonette CROME, RN Outcome: Progressing 10/26/2023 0443 by Jackquline Leonette CROME, RN Outcome: Progressing Goal: Respiratory complications will improve 10/26/2023 0443 by Jackquline Leonette CROME, RN Outcome: Progressing 10/26/2023 0443 by Jackquline Leonette CROME, RN Outcome: Progressing Goal: Cardiovascular complication will be avoided 10/26/2023 0443 by Jackquline Leonette CROME, RN Outcome: Progressing 10/26/2023 0443 by Jackquline Leonette CROME, RN Outcome: Progressing   Problem: Activity: Goal: Risk for activity intolerance will decrease 10/26/2023 0443 by Jackquline Leonette CROME, RN Outcome: Progressing 10/26/2023 0443 by Jackquline Leonette CROME, RN Outcome: Progressing   Problem: Nutrition: Goal: Adequate nutrition will be maintained 10/26/2023 0443 by Jackquline Leonette CROME, RN Outcome: Progressing 10/26/2023 0443 by Jackquline Leonette CROME, RN Outcome: Progressing   Problem: Coping: Goal: Level of anxiety will decrease 10/26/2023  0443 by Jackquline Leonette CROME, RN Outcome: Progressing 10/26/2023 0443 by Jackquline Leonette CROME, RN Outcome: Progressing   Problem: Elimination: Goal: Will not experience complications related to bowel motility 10/26/2023 0443 by Jackquline Leonette CROME, RN Outcome: Progressing 10/26/2023 0443 by Jackquline Leonette CROME, RN Outcome: Progressing Goal: Will not experience complications related to urinary retention 10/26/2023 0443 by Jackquline Leonette CROME, RN Outcome: Progressing 10/26/2023 0443 by Jackquline Leonette CROME, RN Outcome: Progressing   Problem: Pain Managment: Goal: General experience of comfort will improve and/or be controlled 10/26/2023 0443 by Jackquline Leonette CROME, RN Outcome: Progressing 10/26/2023 0443 by Jackquline Leonette CROME, RN Outcome: Progressing   Problem: Safety: Goal: Ability to remain free from injury will improve 10/26/2023 0443 by Jackquline Leonette CROME, RN Outcome: Progressing 10/26/2023 0443 by Jackquline Leonette CROME, RN Outcome: Progressing   Problem: Skin Integrity: Goal: Risk for impaired skin integrity will decrease 10/26/2023 0443 by Jackquline Leonette CROME, RN Outcome: Progressing 10/26/2023 0443 by Jackquline Leonette CROME, RN Outcome: Progressing   Problem: Education: Goal: Ability to describe self-care measures that may prevent or decrease complications (Diabetes Survival Skills Education) will improve 10/26/2023 0443 by Jackquline Leonette CROME, RN Outcome: Progressing 10/26/2023 0443 by Jackquline Leonette CROME, RN Outcome: Progressing Goal: Individualized Educational Video(s) 10/26/2023 0443 by Jackquline Leonette CROME, RN Outcome: Progressing 10/26/2023 0443 by Jackquline Leonette CROME, RN Outcome: Progressing   Problem: Coping: Goal: Ability to adjust to condition or change in health will improve 10/26/2023 0443 by Jackquline Leonette CROME, RN Outcome: Progressing 10/26/2023 0443 by Jackquline Leonette CROME, RN Outcome: Progressing   Problem: Fluid Volume: Goal: Ability to maintain a balanced intake and output will improve 10/26/2023  0443 by Jackquline Leonette CROME, RN Outcome: Progressing 10/26/2023 0443 by Jackquline Leonette CROME, RN Outcome: Progressing   Problem: Health Behavior/Discharge Planning: Goal: Ability to identify and utilize available resources and services will improve 10/26/2023 0443 by Jackquline Leonette CROME, RN Outcome: Progressing 10/26/2023 0443 by  Jackquline Leonette CROME, RN Outcome: Progressing Goal: Ability to manage health-related needs will improve 10/26/2023 0443 by Jackquline Leonette CROME, RN Outcome: Progressing 10/26/2023 0443 by Jackquline Leonette CROME, RN Outcome: Progressing   Problem: Metabolic: Goal: Ability to maintain appropriate glucose levels will improve 10/26/2023 0443 by Jackquline Leonette CROME, RN Outcome: Progressing 10/26/2023 0443 by Jackquline Leonette CROME, RN Outcome: Progressing   Problem: Nutritional: Goal: Maintenance of adequate nutrition will improve 10/26/2023 0443 by Jackquline Leonette CROME, RN Outcome: Progressing 10/26/2023 0443 by Jackquline Leonette CROME, RN Outcome: Progressing Goal: Progress toward achieving an optimal weight will improve 10/26/2023 0443 by Jackquline Leonette CROME, RN Outcome: Progressing 10/26/2023 0443 by Jackquline Leonette CROME, RN Outcome: Progressing   Problem: Skin Integrity: Goal: Risk for impaired skin integrity will decrease 10/26/2023 0443 by Jackquline Leonette CROME, RN Outcome: Progressing 10/26/2023 0443 by Jackquline Leonette CROME, RN Outcome: Progressing   Problem: Tissue Perfusion: Goal: Adequacy of tissue perfusion will improve 10/26/2023 0443 by Jackquline Leonette CROME, RN Outcome: Progressing 10/26/2023 0443 by Jackquline Leonette CROME, RN Outcome: Progressing   Problem: Education: Goal: Knowledge about tracheostomy care/management will improve 10/26/2023 0443 by Jackquline Leonette CROME, RN Outcome: Progressing 10/26/2023 0443 by Jackquline Leonette CROME, RN Outcome: Progressing   Problem: Activity: Goal: Ability to tolerate increased activity will improve 10/26/2023 0443 by Jackquline Leonette CROME, RN Outcome:  Progressing 10/26/2023 0443 by Jackquline Leonette CROME, RN Outcome: Progressing   Problem: Health Behavior/Discharge Planning: Goal: Ability to manage tracheostomy will improve 10/26/2023 0443 by Jackquline Leonette CROME, RN Outcome: Progressing 10/26/2023 0443 by Jackquline Leonette CROME, RN Outcome: Progressing   Problem: Education: Goal: Knowledge of General Education information will improve Description: Including pain rating scale, medication(s)/side effects and non-pharmacologic comfort measures 10/26/2023 0443 by Jackquline Leonette CROME, RN Outcome: Progressing 10/26/2023 0443 by Jackquline Leonette CROME, RN Outcome: Progressing   Problem: Health Behavior/Discharge Planning: Goal: Ability to manage health-related needs will improve 10/26/2023 0443 by Jackquline Leonette CROME, RN Outcome: Progressing 10/26/2023 0443 by Jackquline Leonette CROME, RN Outcome: Progressing   Problem: Clinical Measurements: Goal: Ability to maintain clinical measurements within normal limits will improve 10/26/2023 0443 by Jackquline Leonette CROME, RN Outcome: Progressing 10/26/2023 0443 by Jackquline Leonette CROME, RN Outcome: Progressing Goal: Will remain free from infection 10/26/2023 0443 by Jackquline Leonette CROME, RN Outcome: Progressing 10/26/2023 0443 by Jackquline Leonette CROME, RN Outcome: Progressing Goal: Diagnostic test results will improve 10/26/2023 0443 by Jackquline Leonette CROME, RN Outcome: Progressing 10/26/2023 0443 by Jackquline Leonette CROME, RN Outcome: Progressing Goal: Respiratory complications will improve 10/26/2023 0443 by Jackquline Leonette CROME, RN Outcome: Progressing 10/26/2023 0443 by Jackquline Leonette CROME, RN Outcome: Progressing Goal: Cardiovascular complication will be avoided 10/26/2023 0443 by Jackquline Leonette CROME, RN Outcome: Progressing 10/26/2023 0443 by Jackquline Leonette CROME, RN Outcome: Progressing   Problem: Activity: Goal: Risk for activity intolerance will decrease 10/26/2023 0443 by Jackquline Leonette CROME, RN Outcome: Progressing 10/26/2023 0443 by  Jackquline Leonette CROME, RN Outcome: Progressing   Problem: Nutrition: Goal: Adequate nutrition will be maintained 10/26/2023 0443 by Jackquline Leonette CROME, RN Outcome: Progressing 10/26/2023 0443 by Jackquline Leonette CROME, RN Outcome: Progressing   Problem: Coping: Goal: Level of anxiety will decrease 10/26/2023 0443 by Jackquline Leonette CROME, RN Outcome: Progressing 10/26/2023 0443 by Jackquline Leonette CROME, RN Outcome: Progressing   Problem: Elimination: Goal: Will not experience complications related to bowel motility 10/26/2023 0443 by Jackquline Leonette CROME, RN Outcome: Progressing 10/26/2023 0443 by Jackquline Leonette CROME, RN Outcome: Progressing Goal: Will not experience complications related to urinary retention 10/26/2023  9556 by Jackquline Leonette CROME, RN Outcome: Progressing 10/26/2023 0443 by Jackquline Leonette CROME, RN Outcome: Progressing   Problem: Pain Managment: Goal: General experience of comfort will improve and/or be controlled 10/26/2023 0443 by Jackquline Leonette CROME, RN Outcome: Progressing 10/26/2023 0443 by Jackquline Leonette CROME, RN Outcome: Progressing   Problem: Safety: Goal: Ability to remain free from injury will improve Outcome: Progressing   Problem: Skin Integrity: Goal: Risk for impaired skin integrity will decrease Outcome: Progressing

## 2023-10-26 NOTE — Progress Notes (Signed)
 Changed dressing on stump. Of note looks angry red compared to yesterday along the sutures.

## 2023-10-26 NOTE — Plan of Care (Signed)
  Problem: Education: Goal: Knowledge of General Education information will improve Description: Including pain rating scale, medication(s)/side effects and non-pharmacologic comfort measures Outcome: Progressing   Problem: Health Behavior/Discharge Planning: Goal: Ability to manage health-related needs will improve Outcome: Progressing   Problem: Clinical Measurements: Goal: Ability to maintain clinical measurements within normal limits will improve Outcome: Progressing Goal: Will remain free from infection Outcome: Progressing Goal: Diagnostic test results will improve Outcome: Progressing Goal: Respiratory complications will improve Outcome: Progressing Goal: Cardiovascular complication will be avoided Outcome: Progressing   Problem: Activity: Goal: Risk for activity intolerance will decrease Outcome: Progressing   Problem: Nutrition: Goal: Adequate nutrition will be maintained Outcome: Progressing   Problem: Coping: Goal: Level of anxiety will decrease Outcome: Progressing   Problem: Elimination: Goal: Will not experience complications related to bowel motility Outcome: Progressing Goal: Will not experience complications related to urinary retention Outcome: Progressing   Problem: Pain Managment: Goal: General experience of comfort will improve and/or be controlled Outcome: Progressing   Problem: Safety: Goal: Ability to remain free from injury will improve Outcome: Progressing   Problem: Skin Integrity: Goal: Risk for impaired skin integrity will decrease Outcome: Progressing   Problem: Education: Goal: Ability to describe self-care measures that may prevent or decrease complications (Diabetes Survival Skills Education) will improve Outcome: Progressing Goal: Individualized Educational Video(s) Outcome: Progressing   Problem: Coping: Goal: Ability to adjust to condition or change in health will improve Outcome: Progressing   Problem: Fluid  Volume: Goal: Ability to maintain a balanced intake and output will improve Outcome: Progressing   Problem: Health Behavior/Discharge Planning: Goal: Ability to identify and utilize available resources and services will improve Outcome: Progressing Goal: Ability to manage health-related needs will improve Outcome: Progressing   Problem: Metabolic: Goal: Ability to maintain appropriate glucose levels will improve Outcome: Progressing   Problem: Nutritional: Goal: Maintenance of adequate nutrition will improve Outcome: Progressing Goal: Progress toward achieving an optimal weight will improve Outcome: Progressing   Problem: Skin Integrity: Goal: Risk for impaired skin integrity will decrease Outcome: Progressing   Problem: Tissue Perfusion: Goal: Adequacy of tissue perfusion will improve Outcome: Progressing   Problem: Education: Goal: Knowledge of General Education information will improve Description: Including pain rating scale, medication(s)/side effects and non-pharmacologic comfort measures Outcome: Progressing   Problem: Health Behavior/Discharge Planning: Goal: Ability to manage health-related needs will improve Outcome: Progressing   Problem: Clinical Measurements: Goal: Ability to maintain clinical measurements within normal limits will improve Outcome: Progressing Goal: Will remain free from infection Outcome: Progressing Goal: Diagnostic test results will improve Outcome: Progressing Goal: Respiratory complications will improve Outcome: Progressing Goal: Cardiovascular complication will be avoided Outcome: Progressing   Problem: Activity: Goal: Risk for activity intolerance will decrease Outcome: Progressing   Problem: Nutrition: Goal: Adequate nutrition will be maintained Outcome: Progressing   Problem: Coping: Goal: Level of anxiety will decrease Outcome: Progressing   Problem: Elimination: Goal: Will not experience complications related to  bowel motility Outcome: Progressing Goal: Will not experience complications related to urinary retention Outcome: Progressing   Problem: Pain Managment: Goal: General experience of comfort will improve and/or be controlled Outcome: Progressing   Problem: Safety: Goal: Ability to remain free from injury will improve Outcome: Progressing   Problem: Skin Integrity: Goal: Risk for impaired skin integrity will decrease Outcome: Progressing

## 2023-10-27 ENCOUNTER — Encounter: Payer: Self-pay | Admitting: *Deleted

## 2023-10-27 ENCOUNTER — Inpatient Hospital Stay (HOSPITAL_COMMUNITY)

## 2023-10-27 DIAGNOSIS — C9000 Multiple myeloma not having achieved remission: Secondary | ICD-10-CM

## 2023-10-27 DIAGNOSIS — D649 Anemia, unspecified: Secondary | ICD-10-CM | POA: Diagnosis not present

## 2023-10-27 DIAGNOSIS — S7011XA Contusion of right thigh, initial encounter: Secondary | ICD-10-CM | POA: Diagnosis not present

## 2023-10-27 DIAGNOSIS — A419 Sepsis, unspecified organism: Secondary | ICD-10-CM | POA: Diagnosis not present

## 2023-10-27 DIAGNOSIS — R6521 Severe sepsis with septic shock: Secondary | ICD-10-CM | POA: Diagnosis not present

## 2023-10-27 LAB — CBC
HCT: 29.6 % — ABNORMAL LOW (ref 39.0–52.0)
Hemoglobin: 9.4 g/dL — ABNORMAL LOW (ref 13.0–17.0)
MCH: 31.2 pg (ref 26.0–34.0)
MCHC: 31.8 g/dL (ref 30.0–36.0)
MCV: 98.3 fL (ref 80.0–100.0)
Platelets: 405 K/uL — ABNORMAL HIGH (ref 150–400)
RBC: 3.01 MIL/uL — ABNORMAL LOW (ref 4.22–5.81)
RDW: 18.6 % — ABNORMAL HIGH (ref 11.5–15.5)
WBC: 5.6 K/uL (ref 4.0–10.5)
nRBC: 0.5 % — ABNORMAL HIGH (ref 0.0–0.2)

## 2023-10-27 LAB — GLUCOSE, CAPILLARY
Glucose-Capillary: 181 mg/dL — ABNORMAL HIGH (ref 70–99)
Glucose-Capillary: 80 mg/dL (ref 70–99)
Glucose-Capillary: 195 mg/dL — ABNORMAL HIGH (ref 70–99)
Glucose-Capillary: 150 mg/dL — ABNORMAL HIGH (ref 70–99)

## 2023-10-27 LAB — IGG, IGA, IGM
IgA: 78 mg/dL — ABNORMAL LOW (ref 90–386)
IgG (Immunoglobin G), Serum: 950 mg/dL (ref 603–1613)
IgM (Immunoglobulin M), Srm: 10 mg/dL — ABNORMAL LOW (ref 20–172)

## 2023-10-27 LAB — BASIC METABOLIC PANEL WITH GFR
Anion gap: 8 (ref 5–15)
BUN: 42 mg/dL — ABNORMAL HIGH (ref 6–20)
CO2: 27 mmol/L (ref 22–32)
Calcium: 8.4 mg/dL — ABNORMAL LOW (ref 8.9–10.3)
Chloride: 100 mmol/L (ref 98–111)
Creatinine, Ser: 1.12 mg/dL (ref 0.61–1.24)
GFR, Estimated: >60 mL/min (ref >60)
Glucose, Bld: 123 mg/dL — ABNORMAL HIGH (ref 70–99)
Potassium: 4 mmol/L (ref 3.5–5.1)
Sodium: 135 mmol/L (ref 135–145)

## 2023-10-27 LAB — KAPPA/LAMBDA LIGHT CHAINS
Kappa free light chain: 13.7 mg/L (ref 3.3–19.4)
Kappa, lambda light chain ratio: 1.2 (ref 0.26–1.65)
Lambda free light chains: 11.4 mg/L (ref 5.7–26.3)

## 2023-10-27 MED ORDER — DOXYCYCLINE HYCLATE 100 MG PO TABS
100.0000 mg | ORAL_TABLET | Freq: Two times a day (BID) | ORAL | Status: DC
  Administered 2023-10-27 – 2023-10-30 (×13): 100 mg via ORAL
  Filled 2023-10-27 (×7): qty 1

## 2023-10-27 MED ORDER — IOHEXOL 350 MG/ML SOLN
100.0000 mL | Freq: Once | INTRAVENOUS | Status: AC | PRN
  Administered 2023-10-27 (×2): 100 mL via INTRAVENOUS

## 2023-10-27 NOTE — TOC Progression Note (Signed)
 Transition of Care (TOC) - Progression Note    Patient Details  Name: Frank [name]. MRN: [id number] Date of Birth: [birth year]  Transition of [facility]) CM/SW Contact  Lauraine FORBES Saa, LCSWA Phone Number: 10/27/2023, 3:25 PM  Clinical Narrative:     3:25 PM CSW submitted SNF insurance authorization for [facility] SNF admission. Medical team and SNF made aware. [facility] SNF admissions requested patient's CPAP settings or CPAP to be brought from home. CSW relayed request to patient's significant other, [name]. [name] confirmed that she is able to provide [facility] patient's CPAP upon SNF admission. [name] expressed interest in scanning for blood clots in legs prior to patient's discharge and informed CSW of patient beginning antibiotics today. CSW relayed information to medical team. Hospitalist confirmed patient will be on PO antibiotics at discharge, will receive chemotherapy tomorrow, and that there is no indication to rescan patient's legs as it will not change management. Patient is to remain on anticoagulation life long. CSW will continue to follow and be available to assist.  Expected Discharge Plan: Skilled Nursing Facility Barriers to Discharge: Continued Medical Work up, English as a second language teacher               Expected Discharge Plan and Services In-house Referral: Clinical Social Work Discharge Planning Services: [employer] Consult Post Acute Care Choice: Skilled Nursing Facility Living arrangements for the past 2 months: Single Family Home                 DME Arranged: N/A DME Agency: NA       HH Arranged: NA HH Agency: NA         Social Drivers of Health (SDOH) Interventions SDOH Screenings   Food Insecurity: No Food Insecurity (07/05/2023)  Housing: Low Risk  (07/05/2023)  Transportation Needs: No Transportation Needs (07/05/2023)  Utilities: Not At Risk (07/05/2023)  Tobacco Use: High Risk (10/13/2023)    Readmission Risk  Interventions     No data to display

## 2023-10-27 NOTE — Progress Notes (Signed)
 Occupational Therapy Treatment Patient Details Name: Frank [name]. MRN: [id number] DOB: Feb 11, 1966 Today's Date: 10/27/2023   History of present illness 58 y.o. male transferred from [facility] 07/03/23 to [facility] for management of newly diagnosed plasma cell myeloma with weakness and AMS. Pt also with AKI and metabolic encephalopathy. 5/10 transfer to [facility] for iHD. 4/22 Intubated and chemo initiated. 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 5/6 IVIG started. 5/8-6/11 trach. 5/12 iHD initiated. 5/29 PEG placed. 6/5 chemo initiated. 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 STAR OT/PT session: Pt reports increased pain in L residual limb reporting when it was unwrapped this morning, it was noted to be red along the suture line; awaiting CT results. Pt declined to get to wheelchair today, but wanted to work on standing and agreeable to AD tasks seated EOB. Pt required min A with increased time to sit EOB. Sit-stand x 3 attempts: 1st and 3rd attempt with total A +2, second attempt, max A +2 and able to maintain standing for ~30 sec. Pt participated in grooming/hygiene, UB ADLS with set up, LB bathing mod A. Pt eager to d/c to SNF in [place] for short term rehab. OT will continue to follow acutely to maximize level of function and safety      If plan is discharge home, recommend the following:  Two people to help with walking and/or transfers;Assistance with cooking/housework;Assistance with feeding;Direct supervision/assist for medications management;Direct supervision/assist for financial management;Assist for transportation;Help with stairs or ramp for entrance;A lot of help with bathing/dressing/bathroom   Equipment Recommendations  Other (comment) (TBD)    Recommendations for Other Services      Precautions / Restrictions Precautions Precautions:  Fall Recall of Precautions/Restrictions: Impaired Restrictions Weight Bearing Restrictions Per Provider Order: Yes LUE Weight Bearing Per Provider Order: Non weight bearing Other Position/Activity Restrictions: ROM to tolerance       Mobility Bed Mobility Overal bed mobility: Needs Assistance Bed Mobility: Supine to Sit, Sit to Supine     Supine to sit: HOB elevated, Min assist, Used rails Sit to supine: Min assist   General bed mobility comments: pt able to bring himself to EOB with HoB elevated and use of LUE on rail, increased cuing for lateral leans to be able to scoot contralateral hip forward. alternated hips until feet on floor    Transfers Overall transfer level: Needs assistance Equipment used: Rolling walker (2 wheels) Transfers: Sit to/from Stand Sit to Stand: Total assist, Max assist, +2 physical assistance, From elevated surface           General transfer comment: pt attempted to come to standing 3x, 1st time pt able to clear hips from bed surface, despite total Ax2 and maximal cuing unable to achieve upright. On second attempt pt able to sequence correctly and come to fully upright with maxAx2, attempted 3rd time able to just barely clear hips from bed with totalAx2     Balance Overall balance assessment: Needs assistance Sitting-balance support: Feet supported, No upper extremity supported Sitting balance-Leahy Scale: Fair     Standing balance support: Single extremity supported Standing balance-Leahy Scale: Poor Standing balance comment: pt able to static stand for ~30 sec with assist of 2, L leaning during stand at EOB with RW  ADL either performed or assessed with clinical judgement   ADL Overall ADL's : Needs assistance/impaired     Grooming: Wash/dry hands;Wash/dry face;Set up   Upper Body Bathing: Minimal assistance;Sitting   Lower Body Bathing: Moderate assistance;Sitting/lateral leans   Upper Body  Dressing : Minimal assistance;Sitting           Toileting- Clothing Manipulation and Hygiene: Total assistance;Sitting/lateral lean;Sit to/from stand       Functional mobility during ADLs: Maximal assistance;Total assistance;Supervision/safety      Extremity/Trunk Assessment Upper Extremity Assessment Upper Extremity Assessment: Generalized weakness   Lower Extremity Assessment Lower Extremity Assessment: Defer to PT evaluation        Vision Baseline Vision/History: 1 Wears glasses Ability to See in Adequate Light: 0 Adequate Patient Visual Report: No change from baseline     Perception     Praxis     Communication Communication Communication: Impaired Factors Affecting Communication: Reduced clarity of speech   Cognition Arousal: Alert Behavior During Therapy: WFL for tasks assessed/performed               OT - Cognition Comments: pt very  talkative and jovial                 Following commands: Impaired Following commands impaired: Follows one step commands with increased time      Cueing   Cueing Techniques: Verbal cues, Tactile cues, Visual cues  Exercises      Shoulder Instructions       General Comments pt remains motivated to work with therapy whenever it is offered, VSS on RA    Pertinent Vitals/ Pain       Pain Assessment Pain Assessment: Faces Faces Pain Scale: Hurts whole lot Pain Location: LUE and right knee Pain Descriptors / Indicators: Grimacing, Guarding, Numbness, Tingling Pain Intervention(s): Limited activity within patient's tolerance, Monitored during session, Repositioned  Home Living                                          Prior Functioning/Environment              Frequency  Min 1X/week        Progress Toward Goals  OT Goals(current goals can now be found in the care plan section)  Progress towards OT goals: Progressing toward goals     Plan      Co-evaluation     PT/OT/SLP Co-Evaluation/Treatment: Yes Reason for Co-Treatment: For patient/therapist safety;To address functional/ADL transfers PT goals addressed during session: Mobility/safety with mobility;Balance OT goals addressed during session: ADL's and self-care;Strengthening/ROM      AM-PAC OT 6 Clicks Daily Activity     Outcome Measure   Help from another person eating meals?: A Little Help from another person taking care of personal grooming?: A Little Help from another person toileting, which includes using toliet, bedpan, or urinal?: Total Help from another person bathing (including washing, rinsing, drying)?: A Lot Help from another person to put on and taking off regular upper body clothing?: A Little Help from another person to put on and taking off regular lower body clothing?: A Lot 6 Click Score: 14    End of Session Equipment Utilized During Treatment: Gait belt;Other (comment) (RW)  OT Visit Diagnosis: Unsteadiness on feet (R26.81);Other abnormalities of gait and mobility (R26.89);Muscle weakness (generalized) (M62.81);Pain Pain - Right/Left: Left Pain - part of body: Shoulder (R LE)  Activity Tolerance Patient tolerated treatment well;Patient limited by pain   Patient Left in bed;with call bell/phone within reach   Nurse Communication Mobility status        Time: 8948-8851 OT Time Calculation (min): 57 min  Charges: OT General Charges $OT Visit: 1 Visit OT Treatments $Self Care/Home Management : 8-22 mins $Therapeutic Activity: 8-22 mins    Jacques Karna Loose 10/27/2023, 2:13 PM

## 2023-10-27 NOTE — Progress Notes (Signed)
 MD hospitalist  contacted via secure text.  This RN has changed bandage for last three nights and it is really red along suture lines only in one area (outer arm at top). Pt says that is where the pain has increased as well.  Advised pt to have day shift MD take a look at it.

## 2023-10-27 NOTE — Plan of Care (Signed)
 Problem: Education: Goal: Knowledge of General Education information will improve Description: Including pain rating scale, medication(s)/side effects and non-pharmacologic comfort measures Outcome: Progressing   Problem: Health Behavior/Discharge Planning: Goal: Ability to manage health-related needs will improve Outcome: Progressing   Problem: Clinical Measurements: Goal: Ability to maintain clinical measurements within normal limits will improve Outcome: Progressing Goal: Will remain free from infection Outcome: Progressing Goal: Diagnostic test results will improve Outcome: Progressing Goal: Respiratory complications will improve Outcome: Progressing Goal: Cardiovascular complication will be avoided Outcome: Progressing   Problem: Activity: Goal: Risk for activity intolerance will decrease Outcome: Progressing   Problem: Nutrition: Goal: Adequate nutrition will be maintained Outcome: Progressing   Problem: Coping: Goal: Level of anxiety will decrease Outcome: Progressing   Problem: Elimination: Goal: Will not experience complications related to bowel motility Outcome: Progressing Goal: Will not experience complications related to urinary retention Outcome: Progressing   Problem: Pain Managment: Goal: General experience of comfort will improve and/or be controlled Outcome: Progressing   Problem: Safety: Goal: Ability to remain free from injury will improve Outcome: Progressing   Problem: Skin Integrity: Goal: Risk for impaired skin integrity will decrease Outcome: Progressing   Problem: Education: Goal: Ability to describe self-care measures that may prevent or decrease complications (Diabetes Survival Skills Education) will improve Outcome: Progressing Goal: Individualized Educational Video(s) Outcome: Progressing   Problem: Coping: Goal: Ability to adjust to condition or change in health will improve Outcome: Progressing   Problem: Fluid  Volume: Goal: Ability to maintain a balanced intake and output will improve Outcome: Progressing   Problem: Health Behavior/Discharge Planning: Goal: Ability to identify and utilize available resources and services will improve Outcome: Progressing Goal: Ability to manage health-related needs will improve Outcome: Progressing   Problem: Metabolic: Goal: Ability to maintain appropriate glucose levels will improve Outcome: Progressing   Problem: Nutritional: Goal: Maintenance of adequate nutrition will improve Outcome: Progressing Goal: Progress toward achieving an optimal weight will improve Outcome: Progressing   Problem: Skin Integrity: Goal: Risk for impaired skin integrity will decrease Outcome: Progressing   Problem: Tissue Perfusion: Goal: Adequacy of tissue perfusion will improve Outcome: Progressing   Problem: Education: Goal: Knowledge about tracheostomy care/management will improve Outcome: Progressing   Problem: Activity: Goal: Ability to tolerate increased activity will improve Outcome: Progressing   Problem: Health Behavior/Discharge Planning: Goal: Ability to manage tracheostomy will improve Outcome: Progressing   Problem: Education: Goal: Knowledge of General Education information will improve Description: Including pain rating scale, medication(s)/side effects and non-pharmacologic comfort measures Outcome: Progressing   Problem: Health Behavior/Discharge Planning: Goal: Ability to manage health-related needs will improve Outcome: Progressing   Problem: Clinical Measurements: Goal: Ability to maintain clinical measurements within normal limits will improve Outcome: Progressing Goal: Will remain free from infection Outcome: Progressing Goal: Diagnostic test results will improve Outcome: Progressing Goal: Respiratory complications will improve Outcome: Progressing Goal: Cardiovascular complication will be avoided Outcome: Progressing    Problem: Activity: Goal: Risk for activity intolerance will decrease Outcome: Progressing   Problem: Nutrition: Goal: Adequate nutrition will be maintained Outcome: Progressing   Problem: Coping: Goal: Level of anxiety will decrease Outcome: Progressing   Problem: Elimination: Goal: Will not experience complications related to bowel motility Outcome: Progressing Goal: Will not experience complications related to urinary retention Outcome: Progressing   Problem: Pain Managment: Goal: General experience of comfort will improve and/or be controlled Outcome: Progressing   Problem: Safety: Goal: Ability to remain free from injury will improve Outcome: Progressing   Problem: Skin Integrity: Goal: Risk for impaired  skin integrity will decrease Outcome: Progressing

## 2023-10-27 NOTE — Progress Notes (Signed)
 Per Dr Jessy request, referral sent to:  [facility] [address] [place], [place] [address] Phone: [phone] Fax: [phone]  Medical Records sent  Oncology Nurse Navigator Documentation     10/27/2023    3:00 PM  Oncology Nurse Navigator Flowsheets  Navigator Location [facility]  Interventions Referrals  Referrals Medical Oncology  Time Spent with Patient 30

## 2023-10-27 NOTE — Progress Notes (Signed)
 Progress Note   Patient: Frank [name]. FMW:[id number] DOB: February 18, 1966 DOA: 07/03/2023  DOS: the patient was seen and examined on 10/27/2023   Brief hospital course:  58 year old PMH OSA CPAP, pathological fracture and multiple ORIF left Humerus likely due to multiple myeloma, A-fib not on anticoagulation, hyperlipidemia, tobacco use disorder who was feeling weak for 1 week, admitted to [facility] health with concern of sepsis, community-acquired pneumonia and acute encephalopathy. Patient was found to have lytic lesions, he was transferred to [facility] for oncology treatment. In the meantime he developed encephalopathy, acute renal failure. He remained in the ICU for 36 days. Underwent tracheostomy. He was also started on HD after CRRT,off of HD now. Subsequently PEG tube placed 5/29 and IR drain placement of right thigh hip infected hematoma on 7/11 . S/p PEG tube removal. Needs Port-A-Cath placement (for outpatient chemo), either during this hospitalization or as an outpatient. F/u IR recommendations for drain management. Started on heparin  drip on 7/17 because of b/l LE DVT.  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 on 7/28.  Surgical EBL 750 cc. Oncology and IR following.f  Now most issues resolved and awaiting placement in [place].    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  Wound VAC removed 8/1.  Dressing changes, Eliquis  per Ortho.  Some increased redness evaluated today 8/11 by [name].  Initiating on po doxy empirically.  No fever, discharge, leukocytosis.   Infected right flank/gluteal hematoma - S/p IR drain placement 7/11.  Drain exchanged 7/25.  Completed antibiotic course 7/26.  IR following, possible drain removal in the next day or 2, just before discharge.  Concern for sepsis - Completed vancomycin  6/28.  Completed Zyvox  7/26.   Resolved.  Multiple myeloma - Port-A-Cath placed 7/30 by IR.  Started chemo with Velcade /Cytoxan  on 08/21/2023. Chemo changed to Sarclisa /Kyprolis /Cytoxan  later on.  Managed closely by oncology.  Oncology to work on setting up outpatient chemotherapy in [place] upon discharge.  Will receive chemotherapy tomorrow 8/12.    Acute kidney injury on CKD 3A - Secondary to multiple myeloma.  Briefly required HD.  AKI resolved, TDC removed.  Resolved.  Bilateral lower extremity DVT - Eliquis  on board.  Pt will be on life long anticoagulation.  Aspergillosis with pneumonia - Completed voriconazole  8/5.  Acute hypoxic respiratory failure - Trach removed 6/11, decannulated.  Resolved.  Acute metabolic encephalopathy-resolved  Anemia of chronic renal disease - Hemoglobin stable.  Insulin -dependent diabetes mellitus - Continue insulin  sliding scale.  Paroxysmal atrial fibrillation - Rate well-controlled.  Continue verapamil , Eliquis .  Obstructive sleep apnea - CPAP on board.  Dysphagia - Resolved.  PEG tube removed.  On regular diet.  Severe protein calorie malnutrition - Dietary on board.  Goals of care -Working closely with TOC, pt, on disposition planning.  Looking SNF in [place], awaiting bed availability and insurance auth.   Subjective: Patient resting comfortably this morning.  Denies any fever, chills, chest pain, nausea, vomiting, abdominal pain.  In good spirits.  Some redness appreciated in his LUE surgical incision.    Physical Exam:  Vitals:   10/26/23 1613 10/26/23 1924 10/27/23 0539 10/27/23 0803  BP: (!) 131/100 (!) 128/95 (!) 129/91 (!) 120/95  Pulse: 92 92 93 91  Resp: 20 16 16 19   Temp: 97.8 F (36.6 C) 98.3 F (36.8 C) (!) 97.4 F (36.3 C) 98.5 F (36.9  C)  TempSrc: Oral Oral Oral   SpO2: 98% 99% 100% 97%  Weight:      Height:        GENERAL:  Alert, pleasant, no acute distress  HEENT:  EOMI CARDIOVASCULAR:  RRR, no murmurs  appreciated RESPIRATORY:  Clear to auscultation, no wheezing, rales, or rhonchi GASTROINTESTINAL:  Soft, nontender, nondistended EXTREMITIES: L UE above elbow amputation, drain in right hip NEURO:  No new focal deficits appreciated SKIN:  No rashes noted PSYCH:  Appropriate mood and affect     Data Reviewed:  Imaging Studies: CT HUMERUS LEFT W CONTRAST Result Date: 10/27/2023 CLINICAL DATA:  Checking for cellulitis. An area of redness. History of multiple myeloma and left upper arm amputation EXAM: CT OF THE UPPER LEFT EXTREMITY WITH CONTRAST TECHNIQUE: Multidetector CT imaging of the left upper arm was performed according to the standard protocol following intravenous contrast administration. RADIATION DOSE REDUCTION: This exam was performed according to the departmental dose-optimization program which includes automated exposure control, adjustment of the mA and/or kV according to patient size and/or use of iterative reconstruction technique. CONTRAST:  OMNIPAQUE  IOHEXOL  350 MG/ML SOLN COMPARISON:  Intraoperative images 10/13/2023, left humeral radiographs 09/15/2023 and CT of the left elbow 07/01/2022. Chest CT 07/26/2023 FINDINGS: Bones/Joint/Cartilage In correlation with prior imaging, patient underwent distal humeral hardware removal and amputation through the mid humeral shaft 10/13/2023. Grossly stable postsurgical changes within the remaining distal humeral shaft from hardware removal with associated chronic periosteal thickening. No gross bone destruction, acute fracture or dislocation identified. Advanced glenohumeral degenerative changes with joint space narrowing, osteophytes and subchondral cyst formation in the glenoid and humeral head. Some of the cystic changes are removed from the subchondral bone and are likely secondary to the patient's multiple myeloma. Additional lytic lesions noted in the tip of the left scapula and left ribs. Small shoulder joint effusion with an ossified  loose body in the axillary recess. Mild acromioclavicular degenerative changes. Ligaments Suboptimally assessed by CT. Muscles and Tendons Mild muscular atrophy. No intramuscular fluid collection or suspicious enhancement identified. Soft tissues Postsurgical changes related to recent amputation. Skin thickening along the amputation. No evidence of organized fluid collection, soft tissue emphysema or unexpected foreign body. There are multiple small solid nodules in the left lung, measuring up to 6 mm in the apex (image 50/4) and 7 mm in the lower lobe (image 113/4). Previous chest CT demonstrated widespread ground-glass opacities throughout both lungs which may have obscured these nodules. IMPRESSION: 1. Postsurgical changes related to recent amputation through the mid humeral shaft. No evidence of abscess or soft tissue emphysema. 2. No acute osseous findings. Grossly stable postsurgical changes in the distal humerus and probable sequela of multiple myeloma. No specific evidence of osteomyelitis. 3. Advanced glenohumeral degenerative changes with small shoulder joint effusion and ossified loose body in the axillary recess. 4. Multiple small solid nodules in the left lung, measuring up to 7 mm in the lower lobe. Previous chest CT demonstrated widespread ground-glass opacities throughout both lungs which may have obscured these nodules. These findings are nonspecific and potentially postinflammatory. Metastatic disease considered less likely, although not completely excluded. Suggest follow-up chest CT due to incomplete visualization. Electronically Signed   By: Elsie Perone M.D.   On: 10/27/2023 09:46   IR SCLEROTHERAPY OF A FLUID COLLECTION Result Date: 10/23/2023 INDICATION: Right hip fluid collection continued. Previous doxycycline  sclerosis performed 10/10/2023. Output still persisting although diminished. Planned second and final attempted sclerosing fluid collection. EXAM: Drain change MEDICATIONS: The  patient is currently admitted to the hospital and receiving intravenous antibiotics. The antibiotics were administered within an appropriate time frame prior to the initiation of the procedure. ANESTHESIA/SEDATION: Moderate (conscious) sedation was employed during this procedure. A total of Versed  2 mg and Fentanyl  150 mcg was administered intravenously by the radiology nurse. Total intra-service moderate Sedation Time: 25 mL Omnipaque  3 minutes. The patient's level of consciousness and vital signs were monitored continuously by radiology nursing throughout the procedure under my direct supervision. COMPLICATIONS: None immediate. PROCEDURE: Informed written consent was obtained from the patient after a thorough discussion of the procedural risks, benefits and alternatives. All questions were addressed. Maximal Sterile Barrier Technique was utilized including caps, mask, sterile gowns, sterile gloves, sterile drape, hand hygiene and skin antiseptic. A timeout was performed prior to the initiation of the procedure. With the patient in a supine position, external portion of drainage in the right flank were prepped and draped in the usual sterile. Aggressive flushing was normal saline. Significant decrease in overall size the when to previous. When all of flushing began to return mostly clear rest of pocket was evacuated. Short Amplatz wire was then advanced through the catheter and the catheter was completely patient. New 10 catheter was then advanced over the guidewire coiled more medially. Approximately 40 mL of reconstituted doxycycline  was then injected into the catheter and the catheter was capped. Retention suture and sterile dressing applied. The catheter will be reopened to suction approximately 2 hours. That time there will be no more flushing of the catheter. IMPRESSION: Repeat sclerosing right flank cavity using combination fluoroscopy and aggressive flushing with doxycycline . Electronically Signed   By:  Cordella Banner   On: 10/23/2023 16:23   IR Catheter Tube Change Result Date: 10/23/2023 INDICATION: Right hip fluid collection continued. Previous doxycycline  sclerosis performed 10/10/2023. Output still persisting although diminished. Planned second and final attempted sclerosing fluid collection. EXAM: Drain change MEDICATIONS: The patient is currently admitted to the hospital and receiving intravenous antibiotics. The antibiotics were administered within an appropriate time frame prior to the initiation of the procedure. ANESTHESIA/SEDATION: Moderate (conscious) sedation was employed during this procedure. A total of Versed  2 mg and Fentanyl  150 mcg was administered intravenously by the radiology nurse. Total intra-service moderate Sedation Time: 25 mL Omnipaque  3 minutes. The patient's level of consciousness and vital signs were monitored continuously by radiology nursing throughout the procedure under my direct supervision. COMPLICATIONS: None immediate. PROCEDURE: Informed written consent was obtained from the patient after a thorough discussion of the procedural risks, benefits and alternatives. All questions were addressed. Maximal Sterile Barrier Technique was utilized including caps, mask, sterile gowns, sterile gloves, sterile drape, hand hygiene and skin antiseptic. A timeout was performed prior to the initiation of the procedure. With the patient in a supine position, external portion of drainage in the right flank were prepped and draped in the usual sterile. Aggressive flushing was normal saline. Significant decrease in overall size the when to previous. When all of flushing began to return mostly clear rest of pocket was evacuated. Short Amplatz wire was then advanced through the catheter and the catheter was completely patient. New 10 catheter was then advanced over the guidewire coiled more medially. Approximately 40 mL of reconstituted doxycycline  was then injected into the catheter and the  catheter was capped. Retention suture and sterile dressing applied. The catheter will be reopened to suction approximately 2 hours. That time there will be no more flushing of the catheter. IMPRESSION: Repeat sclerosing right flank  cavity using combination fluoroscopy and aggressive flushing with doxycycline . Electronically Signed   By: Cordella Banner   On: 10/23/2023 16:23   IR IMAGING GUIDED PORT INSERTION Result Date: 10/15/2023 INDICATION: 58 year old male with history multiple myeloma requiring central venous access for chemotherapy administration EXAM: IMPLANTED PORT A CATH PLACEMENT WITH ULTRASOUND AND FLUOROSCOPIC GUIDANCE COMPARISON:  None Available. MEDICATIONS: None. ANESTHESIA/SEDATION: Moderate (conscious) sedation was employed during this procedure. A total of Versed  1.5 mg and Fentanyl  75 mcg was administered intravenously. Moderate Sedation Time: 14 minutes. The patient's level of consciousness and vital signs were monitored continuously by radiology nursing throughout the procedure under my direct supervision. CONTRAST:  None FLUOROSCOPY TIME:  Ten mGy reference air kerma COMPLICATIONS: None immediate. PROCEDURE: The procedure, risks, benefits, and alternatives were explained to the patient. Questions regarding the procedure were encouraged and answered. The patient understands and consents to the procedure. The right neck and chest were prepped with chlorhexidine  in a sterile fashion, and a sterile drape was applied covering the operative field. Maximum barrier sterile technique with sterile gowns and gloves were used for the procedure. A timeout was performed prior to the initiation of the procedure. Ultrasound was used to examine the jugular vein which was compressible and free of internal echoes. A skin marker was used to demarcate the planned venotomy and port pocket incision sites. Local anesthesia was provided to these sites and the subcutaneous tunnel track with 1% lidocaine  with  1:100,000 epinephrine . A small incision was created at the jugular access site and blunt dissection was performed of the subcutaneous tissues. Under ultrasound guidance, the jugular vein was accessed with a 21 ga micropuncture needle and an 0.018 wire was inserted to the superior vena cava. Real-time ultrasound guidance was utilized for vascular access including the acquisition of a permanent ultrasound image documenting patency of the accessed vessel. A 5 Fr micopuncture set was then used, through which a 0.035 Rosen wire was passed under fluoroscopic guidance into the inferior vena cava. An 8 Fr dilator was then placed over the wire. A subcutaneous port pocket was then created along the upper chest wall utilizing a combination of sharp and blunt dissection. The pocket was irrigated with sterile saline, packed with gauze, and observed for hemorrhage. A single lumen ISP sized power injectable port was chosen for placement. The 8 Fr catheter was tunneled from the port pocket site to the venotomy incision. The port was placed in the pocket. The external catheter was trimmed to appropriate length. The dilator was exchanged for an 8 Fr peel-away sheath under fluoroscopic guidance. The catheter was then placed through the sheath and the sheath was removed. Final catheter positioning was confirmed and documented with a fluoroscopic spot radiograph. The port was accessed with a Huber needle, aspirated, and flushed with heparinized saline. The deep dermal layer of the port pocket incision was closed with interrupted 3-0 Vicryl suture. Dermabond was then placed over the port pocket and neck incisions. The patient tolerated the procedure well without immediate post procedural complication. FINDINGS: After catheter placement, the tip lies within the superior cavoatrial junction. The catheter aspirates and flushes normally and is ready for immediate use. IMPRESSION: Successful placement of a power injectable Port-A-Cath via  the right internal jugular vein. The catheter is ready for immediate use. Ester Sides, MD [facility] Electronically Signed   By: Ester Sides M.D.   On: 10/15/2023 16:30   DG Humerus Left Result Date: 10/13/2023 CLINICAL DATA:  Left upper  extremity amputation through the humerus. Multiple myeloma. EXAM: LEFT HUMERUS - 2+ VIEW COMPARISON:  09/15/2023 FINDINGS: A single C-arm image demonstrates the recently demonstrated lytic changes in the distal humerus and associated fixation hardware. IMPRESSION: Single C-arm image of the distal humerus and associated fixation hardware. Electronically Signed   By: Elspeth Bathe M.D.   On: 10/13/2023 11:05   DG C-Arm 1-60 Min-No Report Result Date: 10/13/2023 Fluoroscopy was utilized by the requesting physician.  No radiographic interpretation.   DG C-Arm 1-60 Min-No Report Result Date: 10/13/2023 Fluoroscopy was utilized by the requesting physician.  No radiographic interpretation.   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 Planned sclerosing. EXAM: Sclerosing of abnormal fluid collection COMPARISON:  None Available. MEDICATIONS: 500 mg doxycycline ; intra procedural ANESTHESIA/SEDATION: Moderate (conscious) sedation was employed during this procedure. A total of Versed  2 mg and Fentanyl  100 mcg was administered intravenously by the radiology nurse. Total intra-service moderate Sedation Time: 11 minutes. The patient's level of consciousness and vital signs were monitored continuously by radiology nursing throughout the procedure under my direct supervision. CONTRAST:  10 mL Omnipaque  300-administered into the collecting system(s) FLUOROSCOPY: Radiation Exposure Index (as provided by the fluoroscopic device): 5 mGy Kerma COMPLICATIONS: None immediate. PROCEDURE: Informed written consent was obtained from  the patient after a thorough discussion of the procedural risks, benefits and alternatives. All questions were addressed. Maximal Sterile Barrier Technique was utilized including caps, mask, sterile gowns, sterile gloves, sterile drape, hand hygiene and skin antiseptic. A timeout was performed prior to the initiation of the procedure. The indwelling catheter in the right hip region prepped and draped in usual sterile fashion. The indwelling 10 French catheter was aggressively irrigated with normal saline and flushed. Dilute contrast was then injected under fluoroscopic guidance into the catheter to evaluate if the fluid collection was communicating with vascular or lymphatic structure. Contrast flows freely into the catheter and outlines the fluid collection which is irregular in appearance, however there is no vascular or lymphatic communication identified. The contrast was then mostly withdrawn. The catheter and retention sutures were cut. A new 10 French catheter was then advanced over a guidewire into the fluid collection and repositioned in a more cephalad location. Approximately 500 mg of doxycycline  was reconstituted with 50 mL of normal saline and then approximately 35 mL of this mixture was injected into the 10 French pigtail catheter. The catheter was then capped. Retention suture and sterile dressing were applied. The doxycycline  will dwell within the cavity for approximately 1 hour and then the catheter will be connected to a JP bulb and the patient will be monitored for output. IMPRESSION: Satisfactory exchange of a 10 French right hip drainage catheter with sclerosing using doxycycline  as described above. Catheter will remain to a JP bulb until output has sufficiently diminished. Electronically Signed   By: Cordella Banner   On: 10/10/2023 17:18   CT PELVIS WO CONTRAST Result Date: 10/07/2023 CLINICAL DATA:  Right hip drain evaluation. EXAM: CT PELVIS WITHOUT CONTRAST TECHNIQUE: Multidetector CT  imaging of the pelvis was performed following the standard protocol without intravenous contrast. RADIATION DOSE REDUCTION: This exam was performed according to the departmental dose-optimization program which includes automated exposure control, adjustment of the mA and/or kV according to patient size and/or use of iterative reconstruction technique. COMPARISON:  CT abdomen pelvis dated 07/08/2023. FINDINGS: Urinary Tract: The visualized ureters appear unremarkable. Punctate stone noted in the posterior bladder lumen. The  urinary bladder is otherwise unremarkable. Bowel:  Moderate stool in the colon.  The appendix is normal. Vascular/Lymphatic: Moderate aortoiliac atherosclerotic disease. No pelvic adenopathy. Reproductive: The prostate and seminal vesicles are grossly unremarkable. Other: Right gluteal pigtail drainage catheter. Significant interval decrease in the size of the hematoma in the right gluteal muscle now measuring approximately 4.7 x 9.3 cm. Additional loculated collection in the anterior upper right thigh significantly decreased in size since the prior CT and measures approximately 10 x 17 cm in greatest axial dimensions. Musculoskeletal: Osteopenia with degenerative changes of the spine. Total left hip arthroplasty. Scattered lucent lesions throughout the bones as seen on the prior CT. IMPRESSION: 1. Right gluteal pigtail drainage catheter with significant interval decrease in the size of the hematoma in the right gluteal muscle. 2. Significant interval decrease in the size of the loculated collection in the anterior upper right thigh. 3. Punctate stone in the posterior bladder lumen. 4.  Aortic Atherosclerosis (ICD10-I70.0). Electronically Signed   By: Vanetta Chou M.D.   On: 10/07/2023 16:53   DG CHEST PORT 1 VIEW Result Date: 10/06/2023 EXAM: 1 VIEW XRAY OF THE CHEST 10/06/2023 08:52:00 PM COMPARISON: 09/28/2023 CLINICAL HISTORY: 141880 SOB (shortness of breath) 141880. sob FINDINGS: LUNGS  AND PLEURA: Increased interstitial markings, left greater than right, favoring atypical infection / pneumonia over interstitial edema. No pleural effusion. No pneumothorax. HEART AND MEDIASTINUM: No acute abnormality of the cardiac and mediastinal silhouettes. BONES AND SOFT TISSUES: Degenerative changes of the bilateral shoulders. No acute osseous abnormality. IMPRESSION: 1. Increased interstitial markings, left greater than right, favoring atypical infection/pneumonia over interstitial edema. Electronically signed by: Pinkie Pebbles MD 10/06/2023 08:58 PM EDT RP Workstation: [id number]   CT FEMUR RIGHT WO CONTRAST Result Date: 10/03/2023 CLINICAL DATA:  Right thigh fluid collection, drain maintenance EXAM: CT OF THE LOWER RIGHT EXTREMITY WITHOUT CONTRAST TECHNIQUE: Multidetector CT imaging of the right lower extremity was performed according to the standard protocol. RADIATION DOSE REDUCTION: This exam was performed according to the departmental dose-optimization program which includes automated exposure control, adjustment of the mA and/or kV according to patient size and/or use of iterative reconstruction technique. COMPARISON:  09/24/2023 FINDINGS: Bones/Joint/Cartilage Stable scattered bone lucencies, see description from the 09/24/2023 exam. Stable right hip arthropathy. Prominent osteoarthritis of the right knee. Left total knee prosthesis partially observed. Large spur like projections from the inferior patella extending into the joint. Moderate knee effusion possibly with synovitis. Suspected free osteochondral fragments in the knee joint. Ligaments N/A Muscles and Tendons Semimembranosus muscular atrophy bilaterally. Soft tissues Reduced size of the fluid collection along the superficial fascia margin of the tensor fascia lata and rectus femoris, currently measuring about 20.9 by 3.0 by 11.8 cm (volume = 390 cm^3). Previous total volume cannot be calculated as this was not completely included, but  previous total volume was probably about 3 times is much. Surrounding subcutaneous edema along the right lateral thigh tracking down to the knee. There is also some subcutaneous edema medially in both thighs. Edema tracks along the superficial fascia margin of the posterior calf musculature in the upper calf region. Prominence of stool in visualized colon, cannot exclude constipation. Atherosclerosis noted. IMPRESSION: 1. Reduced size of the fluid collection along the superficial fascia margin of the tensor fascia lata and rectus femoris, currently measuring about 20.9 by 3.0 by 11.8 cm (volume = 390 cc). Previous total volume cannot be calculated as this was not completely included, but previous total volume was probably about 3 times is much. 2.  Surrounding subcutaneous edema along the right lateral thigh tracking down to the knee. There is also some subcutaneous edema medially in both thighs. Edema tracks along the superficial fascia margin of the posterior calf musculature in the upper calf region. 3. Prominent osteoarthritis of the right knee. Moderate knee effusion possibly with synovitis. Suspected free osteochondral fragments in the knee joint. 4. Prominence of stool in visualized colon, cannot exclude constipation. 5. Atherosclerosis. 6. Stable scattered bone lucencies, see description from the 09/24/2023 exam. Electronically Signed   By: Ryan Salvage M.D.   On: 10/03/2023 08:21   VAS US  LOWER EXTREMITY VENOUS (DVT) Result Date: 10/01/2023  Lower Venous DVT Study Patient Name:  Diyan Dave.  Date of Exam:   10/01/2023 Medical Rec #: [id number]           Accession #:    [id number] Date of Birth: [birth year]           Patient Gender: M Patient Age:   52 years Exam Location:  [facility] Procedure:      VAS US  LOWER EXTREMITY VENOUS (DVT) Referring Phys: DELILIAH RASHID --------------------------------------------------------------------------------  Indications: Right thigh swelling, erythema,  pain.  Risk Factors: Extended hospitalization with immobility (3 months). Limitations: Poor ultrasound/tissue interface. Comparison Study: LLEV on 07/18/2023 was negative for DVT. Performing Technologist: Ezzie Potters RVT, RDMS  Examination Guidelines: A complete evaluation includes B-mode imaging, spectral Doppler, color Doppler, and power Doppler as needed of all accessible portions of each vessel. Bilateral testing is considered an integral part of a complete examination. Limited examinations for reoccurring indications may be performed as noted. The reflux portion of the exam is performed with the patient in reverse Trendelenburg.  +---------+---------------+---------+-----------+----------+-------------------+ RIGHT    CompressibilityPhasicitySpontaneityPropertiesThrombus Aging      +---------+---------------+---------+-----------+----------+-------------------+ CFV      Full           Yes      Yes                                      +---------+---------------+---------+-----------+----------+-------------------+ SFJ      Full                                                             +---------+---------------+---------+-----------+----------+-------------------+ FV Prox  Full           Yes      Yes                                      +---------+---------------+---------+-----------+----------+-------------------+ FV Mid   Full           Yes      Yes                                      +---------+---------------+---------+-----------+----------+-------------------+ FV DistalFull           Yes      Yes                                      +---------+---------------+---------+-----------+----------+-------------------+  PFV      Full                                                             +---------+---------------+---------+-----------+----------+-------------------+ POP      Full           Yes      Yes                                       +---------+---------------+---------+-----------+----------+-------------------+ PTV      None           No       No                   Age indeterminate                                                         (one of paired)     +---------+---------------+---------+-----------+----------+-------------------+ PERO     Full                                                             +---------+---------------+---------+-----------+----------+-------------------+   +---------+---------------+---------+-----------+----------+--------------+ LEFT     CompressibilityPhasicitySpontaneityPropertiesThrombus Aging +---------+---------------+---------+-----------+----------+--------------+ CFV      None           No       No                   Acute          +---------+---------------+---------+-----------+----------+--------------+ SFJ      Partial                                      Acute          +---------+---------------+---------+-----------+----------+--------------+ FV Prox  Full           Yes      Yes                                 +---------+---------------+---------+-----------+----------+--------------+ FV Mid   Full           Yes      Yes                                 +---------+---------------+---------+-----------+----------+--------------+ FV DistalFull           Yes      Yes                                 +---------+---------------+---------+-----------+----------+--------------+ PFV      None  No       No                                  +---------+---------------+---------+-----------+----------+--------------+ POP      Full           Yes      Yes                                 +---------+---------------+---------+-----------+----------+--------------+ PTV      Full                                                        +---------+---------------+---------+-----------+----------+--------------+ PERO     Full                                                         +---------+---------------+---------+-----------+----------+--------------+ Proximal CFV compresses, has color, and phasic flow. Mid and distal CFV have occlusive thrombus    Summary: BILATERAL: -No evidence of popliteal cyst, bilaterally. RIGHT: - Findings consistent with age indeterminate deep vein thrombosis involving the right posterior tibial veins.   LEFT: - Findings consistent with acute deep vein thrombosis involving the left common femoral vein, SF junction, and left femoral vein.  - The common femoral vein obstruction does not appear to extend above inguinal ligament.  *See table(s) above for measurements and observations. Electronically signed by Gaile New MD on 10/01/2023 at 5:11:34 PM.    Final    IR GASTROSTOMY TUBE REMOVAL Result Date: 10/01/2023 INDICATION: Gastrostomy tube is no longer needed. EXAM: GASTROSTOMY TUBE REMOVAL MEDICATIONS: None ANESTHESIA/SEDATION: None CONTRAST:  None FLUOROSCOPY: None COMPLICATIONS: None immediate. PROCEDURE: Informed written consent was obtained from the patient after a thorough discussion of the procedural risks, benefits and alternatives. All questions were addressed. A timeout was performed prior to the initiation of the procedure. Balloon retention 18 French gastrostomy tube was intact. 5 cc syringe was used to remove 5 cc of saline from balloon. Gastrostomy tube was removed without difficulty. Site was cleaned and dressed appropriately. IMPRESSION: Successful removal of 18 French balloon retention gastrostomy tube. Performed by Carlin Griffon, PA-C Electronically Signed   By: Juliene Balder M.D.   On: 10/01/2023 16:40   US  RT LOWER EXTREM LTD SOFT TISSUE NON VASCULAR Result Date: 09/30/2023 CLINICAL DATA:  Evaluate for abscess. EXAM: ULTRASOUND RIGHT LOWER EXTREMITY LIMITED TECHNIQUE: Ultrasound examination of the lower extremity soft tissues was performed in the area of clinical concern. COMPARISON:   None Available. FINDINGS: In the region of palpable abnormality in the lateral aspect of the right thigh there is a well-circumscribed is 8.5 x 2.3 x 6.8 cm fluid collection in the subcutaneous tissues. This is 1.8 cm deep to the skin. There is no abnormal vascularity. There is no soft tissue mass. IMPRESSION: 8.5 cm fluid collection in the subcutaneous tissues of the lateral aspect of the right thigh. This may represent a seroma or abscess. Electronically Signed   By: Greig Pique M.D.   On: 09/30/2023 21:47   DG Knee 1-2  Views Right Result Date: 09/29/2023 CLINICAL DATA:  Knee pain and swelling EXAM: RIGHT KNEE - 1-2 VIEW COMPARISON:  X-ray 01/18/2019 FINDINGS: Moderate joint effusion. Moderate joint space loss of the medial compartment and patellofemoral joint. Tricompartmental osteophytes are seen greatest of the patellofemoral joint. Chondrocalcinosis also identified. There is medial translation of the femur relative to the tibia as well. Preserved bone mineralization. IMPRESSION: Advanced tricompartmental degenerative changes. Moderate joint effusion. Chondrocalcinosis Electronically Signed   By: Ranell Bring M.D.   On: 09/29/2023 15:57   DG Ribs Unilateral Left Result Date: 09/28/2023 CLINICAL DATA:  Left-sided rib pain after cough EXAM: LEFT RIBS - 2 VIEW COMPARISON:  Chest x-ray 09/28/2023.  Chest CT 07/26/2023. FINDINGS: Expansile anterolateral left 6 rib lesion is again noted. No displaced fractures are identified. Lungs are grossly clear. IMPRESSION: Expansile anterolateral left 6 rib lesion is again noted. Correlate for point tenderness. No displaced fractures are identified. Electronically Signed   By: Greig Pique M.D.   On: 09/28/2023 21:50   DG CHEST PORT 1 VIEW Result Date: 09/28/2023 CLINICAL DATA:  Chest pain.  Attention left ribs. EXAM: PORTABLE CHEST 1 VIEW COMPARISON:  08/23/2023, CT 07/26/2023 FINDINGS: Removal of right-sided dialysis catheter and tracheostomy tube. Low lung  volumes persist. Ill-defined opacity at the left lung base likely pleural effusion and airspace disease/atelectasis. Stable heart size and mediastinal contours. Vascular congestion. No pneumothorax. Chronic bilateral shoulder arthropathy. Lucent lesions within the thoracic osseous structures on prior CT, including left rib, not well demonstrated by radiograph. IMPRESSION: 1. Ill-defined opacity at the left lung base likely pleural effusion and airspace disease/atelectasis. 2. Vascular congestion. 3. Lucent lesions within the thoracic osseous structures on prior CT, including left rib, are not well demonstrated by radiograph. Electronically Signed   By: Andrea Gasman M.D.   On: 09/28/2023 16:40    There are no new results to review at this time.  Previous records (including but not limited to H&P, progress notes, nursing notes, TOC management) were reviewed in assessment of this patient.  Labs: CBC: Recent Labs  Lab 10/22/23 0337 10/23/23 0310 10/24/23 0357 10/25/23 0355 10/26/23 0319 10/27/23 0327  WBC 9.0 6.5 5.9 5.6 6.0 5.6  NEUTROABS 7.0 4.0 3.4 3.2 3.8  --   HGB 9.5* 9.5* 9.6* 9.4* 9.3* 9.4*  HCT 30.2* 31.3* 31.7* 29.7* 29.2* 29.6*  MCV 99.7 100.3* 102.6* 99.0 97.3 98.3  PLT 371 338 317 353 365 405*   Basic Metabolic Panel: Recent Labs  Lab 10/23/23 0310 10/24/23 0357 10/25/23 0355 10/26/23 0319 10/27/23 0327  NA 131* 132* 132* 133* 135  K 4.3 4.4 4.5 4.5 4.0  CL 99 100 99 99 100  CO2 24 24 25 26 27   GLUCOSE 128* 89 94 120* 123*  BUN 49* 46* 45* 43* 42*  CREATININE 1.26* 1.24 1.19 1.11 1.12  CALCIUM  8.2* 8.5* 8.6* 8.4* 8.4*  MG 1.9 1.8  --   --   --    Liver Function Tests: Recent Labs  Lab 10/22/23 0337 10/23/23 0310 10/24/23 0357 10/25/23 0355 10/26/23 0319  AST 11* 8* 9* 11* 9*  ALT 7 7 7 7 6   ALKPHOS 60 63 60 64 66  BILITOT 0.3 0.4 0.3 <0.2 0.5  PROT 5.2* 5.4* 5.2* 4.9* 5.0*  ALBUMIN  2.1* 2.3* 2.3* 2.2* 2.2*   CBG: Recent Labs  Lab 10/26/23 1146  10/26/23 1612 10/26/23 2132 10/27/23 0813 10/27/23 1146  GLUCAP 123* 128* 138* 181* 80    Scheduled Meds:  (feeding supplement) PROSource Plus  30 mL Oral TID BM   acyclovir   400 mg Oral BID   apixaban   5 mg Oral BID   collagenase    Topical Daily   docusate sodium   100 mg Oral BID   epoetin  alfa  40,000 Units Subcutaneous Q Wed-1800   feeding supplement  237 mL Oral BID WC   guaiFENesin   600 mg Oral BID   heparin  lock flush  500 Units Intravenous Once   hydrALAZINE   25 mg Oral Q8H   insulin  aspart  0-15 Units Subcutaneous TID AC & HS   insulin  aspart  2 Units Subcutaneous TID AC & HS   insulin  glargine-yfgn  13 Units Subcutaneous Q24H   lidocaine   1 patch Transdermal Q24H   montelukast   5 mg Oral QHS   multivitamin with minerals  1 tablet Oral Daily   nutrition supplement (JUVEN)  1 packet Oral BID AC   mouth rinse  15 mL Mouth Rinse 4 times per day   pantoprazole   40 mg Oral BID   PARoxetine   40 mg Oral Daily   senna  1 tablet Oral Daily   umeclidinium-vilanterol  1 puff Inhalation Daily   valproic  acid  750 mg Oral BID   verapamil   120 mg Oral Daily   Continuous Infusions:  sodium chloride  Stopped (10/13/23 0952)   sodium chloride  Stopped (08/28/23 0839)   sodium chloride      PRN Meds:.acetaminophen  (TYLENOL ) oral liquid 160 mg/5 mL **OR** acetaminophen , alum & mag hydroxide-simeth, benzonatate , cyclobenzaprine , HYDROmorphone  (DILAUDID ) injection, hydrOXYzine , ipratropium-albuterol , metoCLOPramide  **OR** metoCLOPramide  (REGLAN ) injection, ondansetron  (ZOFRAN ) IV, oxyCODONE , polyethylene glycol, artificial tears, sodium chloride  flush  Family Communication: None at bedside  Disposition: Status is: Inpatient Remains inpatient appropriate because: See above     Time spent: 33 minutes  Length of inpatient stay: 116 days  Author: Carliss LELON Canales, DO 10/27/2023 12:41 PM  For on call review [email or web address].

## 2023-10-27 NOTE — Progress Notes (Signed)
 Physical Therapy Treatment Patient Details Name: Frank [name]. MRN: [id number] DOB: 1966-01-10 Today's Date: 10/27/2023   History of Present Illness 58 y.o. male transferred from [facility] 07/03/23 to [facility] for management of newly diagnosed plasma cell myeloma with weakness and AMS. Pt also with AKI and metabolic encephalopathy. 5/10 transfer to [facility] for iHD. 4/22 Intubated and chemo initiated. 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 5/6 IVIG started. 5/8-6/11 trach. 5/12 iHD initiated. 5/29 PEG placed. 6/5 chemo initiated. 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 Awaiting CT results. Pt does not want to get to wheelchair today, but would prefer to work on standing at the EoB. Pt is minA and maximal cuing to come to EoB. Once there attempts to come to standing 3x, 1st and 3rd attempt with totalAx2 . Second attempt, pt is able to come to standing with maxAx2 and is able to maintain standing for ~30 sec. Pt is looking forward to discharge to SNF in [place] to be closer to his family. PT will continue to follow acutely.    If plan is discharge home, recommend the following: Two people to help with walking and/or transfers;Assistance with cooking/housework;Assist for transportation;Two people to help with bathing/dressing/bathroom;Direct supervision/assist for medications management;Direct supervision/assist for financial management;Help with stairs or ramp for entrance;Assistance with feeding   Can travel by private vehicle     No  Equipment Recommendations  Hospital bed;Hoyer lift;Wheelchair (measurements PT);Wheelchair cushion (measurements PT);BSC/3in1    Recommendations for Other Services       Precautions / Restrictions  Precautions Precautions: Fall Recall of Precautions/Restrictions: Impaired Restrictions Weight Bearing Restrictions Per Provider Order: Yes LUE Weight Bearing Per Provider Order: Non weight bearing Other Position/Activity Restrictions: ROM to tolerance     Mobility  Bed Mobility Overal bed mobility: Needs Assistance Bed Mobility: Supine to Sit, Sit to Supine     Supine to sit: HOB elevated, Min assist, Used rails Sit to supine: Min assist   General bed mobility comments: pt able to bring himself to EOB with HoB elevated and use of LUE on rail, increased cuing for lateral leans to be able to scoot contralateral hip forward. alternated hips until feet on floor    Transfers Overall transfer level: Needs assistance Equipment used: Rolling walker (2 wheels) Transfers: Sit to/from Stand Sit to Stand: Total assist, Max assist, +2 physical assistance, From elevated surface           General transfer comment: pt attempted to come to standing 3x, 1st time pt able to clear hips from bed surface, despite total Ax2 and maximal cuing unable to achieve upright. On second attempt pt able to sequence correctly and come to fully upright with maxAx2, attempted 3rd time able to just barely clear hips from bed with totalAx2          Balance Overall balance assessment: Needs assistance Sitting-balance support: Feet supported, No upper extremity supported Sitting balance-Leahy Scale: Fair Sitting balance - Comments: with transfer to and from wheelchair   Standing balance support: Single extremity supported Standing balance-Leahy Scale: Poor Standing balance comment: pt able to static stand for ~30 sec with assist of 2  Communication Communication Communication: Impaired Factors Affecting Communication: Reduced clarity of speech  Cognition Arousal: Alert Behavior During Therapy: WFL for tasks assessed/performed                            PT - Cognition Comments: continues to need redirection back to task at hand Following commands: Impaired Following commands impaired: Follows one step commands with increased time (needs increased cuing to not talk and concentrate on command completion)    Cueing Cueing Techniques: Verbal cues, Tactile cues, Visual cues     General Comments General comments (skin integrity, edema, etc.): pt remains motivated to work with therapy whenever it is offered, VSS on RA      Pertinent Vitals/Pain Pain Assessment Pain Assessment: Faces Faces Pain Scale: Hurts whole lot Pain Location: LUE and right knee Pain Descriptors / Indicators: Grimacing, Guarding Pain Intervention(s): Limited activity within patient's tolerance, Monitored during session, Repositioned     PT Goals (current goals can now be found in the care plan section) Acute Rehab PT Goals PT Goal Formulation: With patient Time For Goal Achievement: 11/06/23 Potential to Achieve Goals: Fair Progress towards PT goals: Progressing toward goals    Frequency    Min 5X/week           Co-evaluation PT/OT/SLP Co-Evaluation/Treatment: Yes Reason for Co-Treatment: For patient/therapist safety;To address functional/ADL transfers PT goals addressed during session: Mobility/safety with mobility;Balance OT goals addressed during session: ADL's and self-care;Strengthening/ROM      AM-PAC PT 6 Clicks Mobility   Outcome Measure  Help needed turning from your back to your side while in a flat bed without using bedrails?: A Lot Help needed moving from lying on your back to sitting on the side of a flat bed without using bedrails?: Total Help needed moving to and from a bed to a chair (including a wheelchair)?: Total Help needed standing up from a chair using your arms (e.g., wheelchair or bedside chair)?: Total Help needed to walk in hospital room?: Total Help needed climbing 3-5 steps with a railing? : Total 6 Click Score: 7     End of Session Equipment Utilized During Treatment: Gait belt Activity Tolerance: Patient tolerated treatment well;Patient limited by pain Patient left: with call bell/phone within reach;in bed Nurse Communication: Mobility status PT Visit Diagnosis: Other abnormalities of gait and mobility (R26.89);Muscle weakness (generalized) (M62.81);Difficulty in walking, not elsewhere classified (R26.2)     Time: 8947-8851 PT Time Calculation (min) (ACUTE ONLY): 56 min  Charges:    $Therapeutic Activity: 23-37 mins PT General Charges $$ ACUTE PT VISIT: 1 Visit                     Maryum Batterson B. Fleeta Lapidus PT, DPT Acute Rehabilitation Services Please use secure chat or  Call Office [phone]    [name] [facility] 10/27/2023, 1:30 PM

## 2023-10-27 NOTE — Progress Notes (Addendum)
 Referring Physician(s): Montano,Emil  Supervising Physician: Vanice Revel  Patient Status:  [facility] - In-pt  Chief Complaint: Right hip fluid collection, s/p 8/7 repeat sclerosant therapy  Subjective: Right hip/flank drain in place.   Dark brown, thin liquid in bulb (~50 mL) which patient notes has not been emptied since Friday. No complaints from patient.  Patient reports fever, chills. Team is currently investigating redness associated with LUE amputation incision.   Allergies: Morphine  and codeine, Shellfish allergy, Betadine  [povidone iodine ], Bupropion, Chlorhexidine , Influenza vaccines, and Metoprolol   Medications: Prior to Admission medications   Medication Sig Start Date End Date Taking? Authorizing Provider  albuterol  (PROVENTIL  HFA;VENTOLIN  HFA) 108 (90 Base) MCG/ACT inhaler Inhale 2 puffs into the lungs every 6 (six) hours as needed for wheezing or shortness of breath.   Yes [provider]  albuterol  (PROVENTIL ) (2.5 MG/3ML) 0.083% nebulizer solution 2.5 mg every 2 (two) hours as needed for wheezing or shortness of breath.   Yes [provider]  aspirin  EC 81 MG tablet Take 81 mg by mouth daily. Swallow whole.   Yes [provider]  busPIRone  (BUSPAR ) 15 MG tablet Take 15 mg by mouth at bedtime.   Yes [provider]  celecoxib  (CELEBREX ) 200 MG capsule Take 200 mg by mouth daily.   Yes [provider]  clonazePAM  (KLONOPIN ) 1 MG tablet Take 1 mg by mouth once as needed for anxiety.   Yes [provider]  cyclobenzaprine  (FLEXERIL ) 5 MG tablet Take 1 tablet (5 mg total) by mouth 3 (three) times daily as needed for muscle spasms. Patient taking differently: Take 5 mg by mouth daily as needed for muscle spasms. 01/30/23  Yes Danton Lauraine LABOR, PA-C  divalproex  (DEPAKOTE ) 500 MG DR tablet Take 500 mg by mouth 2 (two) times daily.   Yes [provider]  EPINEPHrine  0.3 mg/0.3 mL IJ SOAJ injection Inject 0.3 mg into  the muscle as needed for anaphylaxis. 08/28/21  Yes [provider]  esomeprazole (NEXIUM) 40 MG capsule Take 40 mg by mouth in the morning.   Yes [provider]  Fluticasone -Salmeterol (ADVAIR) 500-50 MCG/DOSE AEPB Inhale 2 puffs into the lungs in the morning.   Yes [provider]  furosemide  (LASIX ) 40 MG tablet Take 40 mg by mouth daily as needed for fluid or edema.   Yes [provider]  gabapentin  (NEURONTIN ) 300 MG capsule Take 300-600 mg by mouth See admin instructions. 300 mg in the morning, 600 mg at bedtime   Yes [provider]  hydrALAZINE  (APRESOLINE ) 10 MG tablet Take 10 mg by mouth in the morning and at bedtime.   Yes [provider]  hydrochlorothiazide  (HYDRODIURIL ) 25 MG tablet Take 25 mg by mouth daily.   Yes [provider]  lisinopril  (ZESTRIL ) 40 MG tablet Take 40 mg by mouth daily. 03/25/23  Yes [provider]  metFORMIN  (GLUCOPHAGE ) 1000 MG tablet Take 1,000 mg by mouth 2 (two) times daily with a meal.   Yes [provider]  montelukast  (SINGULAIR ) 10 MG tablet Take 10 mg by mouth at bedtime.   Yes [provider]  OVER THE COUNTER MEDICATION Take 400 mg by mouth at bedtime. Burdock root   Yes [provider]  oxyCODONE  (ROXICODONE ) 5 MG immediate release tablet Take 1 tablet (5 mg total) by mouth every 4 (four) hours as needed for severe pain (pain score 7-10). 01/30/23  Yes Danton Lauraine LABOR, PA-C  PARoxetine  (PAXIL ) 40 MG tablet Take 40 mg  by mouth every morning.   Yes [provider]  spironolactone  (ALDACTONE ) 25 MG tablet Take 25 mg by mouth at bedtime. 10/07/21  Yes [provider]  tamsulosin  (FLOMAX ) 0.4 MG CAPS capsule Take 0.4 mg by mouth at bedtime. 10/07/21  Yes [provider]  testosterone  cypionate (DEPOTESTOSTERONE CYPIONATE) 200 MG/ML injection Inject 0.5 mLs (100 mg total) into the muscle every 14 (fourteen) days. 09/17/16  Yes Nida,  Gebreselassie W, MD  verapamil  (CALAN -SR) 120 MG CR tablet Take 120 mg by mouth daily.   Yes [provider]  Vitamin D , Ergocalciferol , (DRISDOL ) 1.25 MG (50000 UNIT) CAPS capsule Take 1 capsule (50,000 Units total) by mouth every Thursday. Patient taking differently: Take 50,000 Units by mouth once a week. Tuesday 02/06/23  Yes Danton Lauraine LABOR, PA-C  buPROPion (WELLBUTRIN XL) 150 MG 24 hr tablet Take 150 mg by mouth daily. Patient not taking: Reported on 07/04/2023    [provider]     Vital Signs: BP (!) 120/95 (BP Location: Right Arm)   Pulse 91   Temp 98.5 F (36.9 C)   Resp 19   Ht 6' (1.829 m)   Wt 253 lb 8.5 oz (115 kg)   SpO2 97%   BMI 34.38 kg/m   Physical Exam Pulmonary:     Effort: Pulmonary effort is normal.  Musculoskeletal:     Comments: R hip drain in place, minimal erythema, dressing c/d/I, brown fluid in bulb, charged well  Neurological:     Mental Status: He is alert and oriented to person, place, and time.  Psychiatric:        Mood and Affect: Mood normal.        Behavior: Behavior normal.    NAD, alert Abdomen: soft, nontender.  Skin: R lateral/hip drain in place.  Insertion site intact, clean, and dry.  Thin brown fluid in bulb.  Output 205 mL yesterday with only minimal drainage this AM.   Imaging: CT HUMERUS LEFT W CONTRAST Result Date: 10/27/2023 CLINICAL DATA:  Checking for cellulitis. An area of redness. History of multiple myeloma and left upper arm amputation EXAM: CT OF THE UPPER LEFT EXTREMITY WITH CONTRAST TECHNIQUE: Multidetector CT imaging of the left upper arm was performed according to the standard protocol following intravenous contrast administration. RADIATION DOSE REDUCTION: This exam was performed according to the departmental dose-optimization program which includes automated exposure control, adjustment of the mA and/or kV according to patient size and/or use of iterative reconstruction technique. CONTRAST:   OMNIPAQUE  IOHEXOL  350 MG/ML SOLN COMPARISON:  Intraoperative images 10/13/2023, left humeral radiographs 09/15/2023 and CT of the left elbow 07/01/2022. Chest CT 07/26/2023 FINDINGS: Bones/Joint/Cartilage In correlation with prior imaging, patient underwent distal humeral hardware removal and amputation through the mid humeral shaft 10/13/2023. Grossly stable postsurgical changes within the remaining distal humeral shaft from hardware removal with associated chronic periosteal thickening. No gross bone destruction, acute fracture or dislocation identified. Advanced glenohumeral degenerative changes with joint space narrowing, osteophytes and subchondral cyst formation in the glenoid and humeral head. Some of the cystic changes are removed from the subchondral bone and are likely secondary to the patient's multiple myeloma. Additional lytic lesions noted in the tip of the left scapula and left ribs. Small shoulder joint effusion with an ossified loose body in the axillary recess. Mild acromioclavicular degenerative changes. Ligaments Suboptimally assessed by CT. Muscles and Tendons Mild muscular atrophy. No intramuscular fluid collection or suspicious enhancement identified. Soft tissues Postsurgical changes related to recent amputation. Skin  thickening along the amputation. No evidence of organized fluid collection, soft tissue emphysema or unexpected foreign body. There are multiple small solid nodules in the left lung, measuring up to 6 mm in the apex (image 50/4) and 7 mm in the lower lobe (image 113/4). Previous chest CT demonstrated widespread ground-glass opacities throughout both lungs which may have obscured these nodules. IMPRESSION: 1. Postsurgical changes related to recent amputation through the mid humeral shaft. No evidence of abscess or soft tissue emphysema. 2. No acute osseous findings. Grossly stable postsurgical changes in the distal humerus and probable sequela of multiple myeloma. No specific  evidence of osteomyelitis. 3. Advanced glenohumeral degenerative changes with small shoulder joint effusion and ossified loose body in the axillary recess. 4. Multiple small solid nodules in the left lung, measuring up to 7 mm in the lower lobe. Previous chest CT demonstrated widespread ground-glass opacities throughout both lungs which may have obscured these nodules. These findings are nonspecific and potentially postinflammatory. Metastatic disease considered less likely, although not completely excluded. Suggest follow-up chest CT due to incomplete visualization. Electronically Signed   By: Elsie Perone M.D.   On: 10/27/2023 09:46    Labs:  CBC: Recent Labs    10/24/23 0357 10/25/23 0355 10/26/23 0319 10/27/23 0327  WBC 5.9 5.6 6.0 5.6  HGB 9.6* 9.4* 9.3* 9.4*  HCT 31.7* 29.7* 29.2* 29.6*  PLT 317 353 365 405*    COAGS: Recent Labs    07/05/23 2227 07/07/23 2030 07/10/23 0411 07/26/23 0423 08/13/23 0908 10/02/23 1456 10/02/23 2305 10/03/23 0719  INR 2.1* 1.9*  --   --  1.0  --   --   --   APTT  --  44*   < > 36  --  43* 39* 52*   < > = values in this interval not displayed.    BMP: Recent Labs    10/24/23 0357 10/25/23 0355 10/26/23 0319 10/27/23 0327  NA 132* 132* 133* 135  K 4.4 4.5 4.5 4.0  CL 100 99 99 100  CO2 24 25 26 27   GLUCOSE 89 94 120* 123*  BUN 46* 45* 43* 42*  CALCIUM  8.5* 8.6* 8.4* 8.4*  CREATININE 1.24 1.19 1.11 1.12  GFRNONAA >60 >60 >60 >60    LIVER FUNCTION TESTS: Recent Labs    10/23/23 0310 10/24/23 0357 10/25/23 0355 10/26/23 0319  BILITOT 0.4 0.3 <0.2 0.5  AST 8* 9* 11* 9*  ALT 7 7 7 6   ALKPHOS 63 60 64 66  PROT 5.4* 5.2* 4.9* 5.0*  ALBUMIN  2.3* 2.3* 2.2* 2.2*    Assessment and Plan: Right flank fluid collection sp drain placement 7/11 for suspected infected hematoma.   First attempt at sclerotherapy to close hematoma pocket was performed 7/25 but drain with ongoing output of 45-50 mL per day.  Second attempt for  sclerotherapy using doxycycline  with longer dwell time performed 8/7 by Dr. Jenna.  Bulb not emptied since Friday, appears that output continues to be lower than prior to output.  Reached out to nursing team for drain empty and daily measurements.  Will trend output for the next 1-2 days and determine whether drain removal indicated.  Unlikely to proceed with additional sclerotherapy.   IR following.   Electronically Signed: Laymon Coast, NP 10/27/2023, 11:45 AM   I spent a total of 15 Minutes at the the patient's bedside AND on the patient's hospital floor or unit, greater than 50% of which was counseling/coordinating care for right flank fluid collection.

## 2023-10-27 NOTE — Progress Notes (Signed)
 Mr. Moring had a good weekend.  He is looking good.  He feels good.  He is out of bed little bit more.  I know he is motivated to do physical therapy.  He has been send in the chair.  His appetite has been good.  He has had no nausea or vomiting.  He has had no problems with diarrhea.  He is still awaiting placement of into a Rehab facility close to home in [place], [place] .  He has had no problems with pain.  He still has a drain in the right hip area.  I do not know if he needs any other scans to see if that fluid collection is resolved.  He has had no bleeding.  He has had no cough or shortness of breath.  His labs show sodium 135.  Potassium 4.0.  BUN 42 creatinine 1.12.  Calcium  8.4.  His white cell count is 5.6.  Hemoglobin 9.4.  Platelet count 405,000.  There has been no rashes.   His vital signs are temperature 97.4.  Pulse 93.  Blood pressure 129/91.  Head and neck exam shows no ocular or oral lesions.  He has no adenopathy in the neck.  Lungs are clear bilaterally.  He has good air movement bilaterally.  Cardiac exam regular rate and rhythm with no murmurs, rubs or bruits.  Abdomen is soft.  Bowel sounds are present.  He has no fluid wave.  There is no guarding or rebound tenderness.  There is no palpable liver or spleen tip.  Extremities shows no clubbing, cyanosis or edema.   He is due for his next dose of chemotherapy tomorrow.  He will get carfilzomib  and Cytoxan .  We are still awaiting placement into a Rehab facility.  His myeloma is doing quite nicely.  I know that we should be able to have him treated close to home once he is transferred up to [facility].  Hopefully, the drainage catheter in the right hip can come out soon.  I know this is incredibly complicated.  I do appreciate everybody's help.    Jeralyn Crease, MD  Isaiah 60:1

## 2023-10-27 NOTE — Progress Notes (Signed)
 Orthopaedic Trauma Progress Note  SUBJECTIVE: Doing okay today.  Noted to have increased redness and tenderness around LUE incision that has worsened over the last 2-3 days. Has noted some subjective fevers, but per chart review Tmax 98.5 over last 48 hours. CT scan left humerus done this AM, no deep infection or fluid collection noted.   OBJECTIVE:  Vitals:   10/27/23 0539 10/27/23 0803  BP: (!) 129/91 (!) 120/95  Pulse: 93 91  Resp: 16 19  Temp: (!) 97.4 F (36.3 C) 98.5 F (36.9 C)  SpO2: 100% 97%    Opiates Today (MME): Today's  total administered Morphine  Milligram Equivalents: 35 Opiates Yesterday (MME): Yesterday's total administered Morphine  Milligram Equivalents: 140  General: Sitting up in bed.  No acute distress.   Respiratory: No increased work of breathing.  Operative Extremity (LUE): Dressing changed, incision as below. Significant redness over lateral arm. Small amount of serosanguinous drainage from mid portion of incision but no areas of dehiscence. All wound edges well approximated. Nontender over the shoulder. Increased tenderness over medial aspect of incision and into axilla.         LABS:  Results for orders placed or performed during the hospital encounter of 07/03/23 (from the past 24 hours)  Glucose, capillary     Status: Abnormal   Collection Time: 10/26/23  4:12 PM  Result Value Ref Range   Glucose-Capillary 128 (H) 70 - 99 mg/dL  Glucose, capillary     Status: Abnormal   Collection Time: 10/26/23  9:32 PM  Result Value Ref Range   Glucose-Capillary 138 (H) 70 - 99 mg/dL  CBC     Status: Abnormal   Collection Time: 10/27/23  3:27 AM  Result Value Ref Range   WBC 5.6 4.0 - 10.5 K/uL   RBC 3.01 (L) 4.22 - 5.81 MIL/uL   Hemoglobin 9.4 (L) 13.0 - 17.0 g/dL   HCT 70.3 (L) 60.9 - 47.9 %   MCV 98.3 80.0 - 100.0 fL   MCH 31.2 26.0 - 34.0 pg   MCHC 31.8 30.0 - 36.0 g/dL   RDW 81.3 (H) 88.4 - 84.4 %   Platelets 405 (H) 150 - 400 K/uL   nRBC 0.5 (H)  0.0 - 0.2 %  Basic metabolic panel with GFR     Status: Abnormal   Collection Time: 10/27/23  3:27 AM  Result Value Ref Range   Sodium 135 135 - 145 mmol/L   Potassium 4.0 3.5 - 5.1 mmol/L   Chloride 100 98 - 111 mmol/L   CO2 27 22 - 32 mmol/L   Glucose, Bld 123 (H) 70 - 99 mg/dL   BUN 42 (H) 6 - 20 mg/dL   Creatinine, Ser 8.87 0.61 - 1.24 mg/dL   Calcium  8.4 (L) 8.9 - 10.3 mg/dL   GFR, Estimated >39 >39 mL/min   Anion gap 8 5 - 15  Glucose, capillary     Status: Abnormal   Collection Time: 10/27/23  8:13 AM  Result Value Ref Range   Glucose-Capillary 181 (H) 70 - 99 mg/dL  Glucose, capillary     Status: None   Collection Time: 10/27/23 11:46 AM  Result Value Ref Range   Glucose-Capillary 80 70 - 99 mg/dL    ASSESSMENT: Frank [name]. is a 58 y.o. male, 14 Days Post-Op s/p LEFT TRANSHUMERAL AMPUTATION   CV/Blood loss: Hgb 9.4 today. Stable. Hemodynamically stable  PLAN: Weightbearing: NWB LUE ROM: Unrestricted motion as tolerated Incisional and dressing care: Wash LUE incision  daily with soap and water . Ok to leave open ot air if no drainage Orthopedic device(s): None Pain management: Continue current multimodal regimen VTE treatment: Eliquis .  Continue SCDs ID: Plan to start on Doxycycline  today Foley/Lines:  No foley, KVO IVFs Dispo: Will start patient on Doxycycline  100 mg BID x 7 days for LUE skin infection. Will continue to monitor. If no improvement, may need to switch to IV abx. Maintain sutures for now, ortho team will manage suture removal. PT/OT as able.  Continue care per primary team and oncology.   Follow - up plan: 2 weeks after d/c for wound check    Contact information:  [name], [name] PA-C. After hours and holidays please check [email or web address] for group call information for [facility]   [name], PA-C [phone] (office) [email or web address]

## 2023-10-27 NOTE — Plan of Care (Signed)
  Problem: Education: Goal: Knowledge of General Education information will improve Description: Including pain rating scale, medication(s)/side effects and non-pharmacologic comfort measures Outcome: Progressing   Problem: Health Behavior/Discharge Planning: Goal: Ability to manage health-related needs will improve Outcome: Progressing   Problem: Clinical Measurements: Goal: Ability to maintain clinical measurements within normal limits will improve Outcome: Progressing Goal: Will remain free from infection Outcome: Progressing Goal: Diagnostic test results will improve Outcome: Progressing Goal: Respiratory complications will improve Outcome: Progressing Goal: Cardiovascular complication will be avoided Outcome: Progressing   Problem: Activity: Goal: Risk for activity intolerance will decrease Outcome: Progressing   Problem: Nutrition: Goal: Adequate nutrition will be maintained Outcome: Progressing   Problem: Coping: Goal: Level of anxiety will decrease Outcome: Progressing   Problem: Elimination: Goal: Will not experience complications related to bowel motility Outcome: Progressing Goal: Will not experience complications related to urinary retention Outcome: Progressing   Problem: Pain Managment: Goal: General experience of comfort will improve and/or be controlled Outcome: Progressing   Problem: Safety: Goal: Ability to remain free from injury will improve Outcome: Progressing   Problem: Skin Integrity: Goal: Risk for impaired skin integrity will decrease Outcome: Progressing   Problem: Education: Goal: Ability to describe self-care measures that may prevent or decrease complications (Diabetes Survival Skills Education) will improve Outcome: Progressing Goal: Individualized Educational Video(s) Outcome: Progressing   Problem: Coping: Goal: Ability to adjust to condition or change in health will improve Outcome: Progressing   Problem: Fluid  Volume: Goal: Ability to maintain a balanced intake and output will improve Outcome: Progressing   Problem: Health Behavior/Discharge Planning: Goal: Ability to identify and utilize available resources and services will improve Outcome: Progressing Goal: Ability to manage health-related needs will improve Outcome: Progressing   Problem: Metabolic: Goal: Ability to maintain appropriate glucose levels will improve Outcome: Progressing   Problem: Nutritional: Goal: Maintenance of adequate nutrition will improve Outcome: Progressing Goal: Progress toward achieving an optimal weight will improve Outcome: Progressing   Problem: Skin Integrity: Goal: Risk for impaired skin integrity will decrease Outcome: Progressing   Problem: Tissue Perfusion: Goal: Adequacy of tissue perfusion will improve Outcome: Progressing   Problem: Education: Goal: Knowledge of General Education information will improve Description: Including pain rating scale, medication(s)/side effects and non-pharmacologic comfort measures Outcome: Progressing   Problem: Health Behavior/Discharge Planning: Goal: Ability to manage health-related needs will improve Outcome: Progressing   Problem: Clinical Measurements: Goal: Ability to maintain clinical measurements within normal limits will improve Outcome: Progressing Goal: Will remain free from infection Outcome: Progressing Goal: Diagnostic test results will improve Outcome: Progressing Goal: Respiratory complications will improve Outcome: Progressing Goal: Cardiovascular complication will be avoided Outcome: Progressing   Problem: Activity: Goal: Risk for activity intolerance will decrease Outcome: Progressing   Problem: Nutrition: Goal: Adequate nutrition will be maintained Outcome: Progressing   Problem: Coping: Goal: Level of anxiety will decrease Outcome: Progressing   Problem: Elimination: Goal: Will not experience complications related to  bowel motility Outcome: Progressing Goal: Will not experience complications related to urinary retention Outcome: Progressing   Problem: Pain Managment: Goal: General experience of comfort will improve and/or be controlled Outcome: Progressing   Problem: Safety: Goal: Ability to remain free from injury will improve Outcome: Progressing   Problem: Skin Integrity: Goal: Risk for impaired skin integrity will decrease Outcome: Progressing

## 2023-10-28 DIAGNOSIS — S7011XA Contusion of right thigh, initial encounter: Secondary | ICD-10-CM | POA: Diagnosis not present

## 2023-10-28 DIAGNOSIS — A419 Sepsis, unspecified organism: Secondary | ICD-10-CM | POA: Diagnosis not present

## 2023-10-28 DIAGNOSIS — R6521 Severe sepsis with septic shock: Secondary | ICD-10-CM | POA: Diagnosis not present

## 2023-10-28 DIAGNOSIS — C9000 Multiple myeloma not having achieved remission: Secondary | ICD-10-CM | POA: Diagnosis not present

## 2023-10-28 LAB — PROTEIN ELECTROPHORESIS, SERUM
A/G Ratio: 1.1 (ref 0.7–1.7)
Albumin ELP: 2.5 g/dL — ABNORMAL LOW (ref 2.9–4.4)
Alpha-1-Globulin: 0.2 g/dL (ref 0.0–0.4)
Alpha-2-Globulin: 0.6 g/dL (ref 0.4–1.0)
Beta Globulin: 0.7 g/dL (ref 0.7–1.3)
Gamma Globulin: 0.8 g/dL (ref 0.4–1.8)
Globulin, Total: 2.3 g/dL (ref 2.2–3.9)
M-Spike, %: 0.5 g/dL — ABNORMAL HIGH
Total Protein ELP: 4.8 g/dL — ABNORMAL LOW (ref 6.0–8.5)

## 2023-10-28 LAB — GLUCOSE, CAPILLARY
Glucose-Capillary: 100 mg/dL — ABNORMAL HIGH (ref 70–99)
Glucose-Capillary: 121 mg/dL — ABNORMAL HIGH (ref 70–99)
Glucose-Capillary: 182 mg/dL — ABNORMAL HIGH (ref 70–99)
Glucose-Capillary: 155 mg/dL — ABNORMAL HIGH (ref 70–99)

## 2023-10-28 MED ORDER — ONDANSETRON HCL 4 MG/2ML IJ SOLN: 4.0000 mg | Freq: Four times a day (QID) | INTRAMUSCULAR | Status: DC | PRN

## 2023-10-28 MED ORDER — PALONOSETRON HCL INJECTION 0.25 MG/5ML
0.2500 mg | Freq: Once | INTRAVENOUS | Status: AC
  Administered 2023-10-28 (×2): 0.25 mg via INTRAVENOUS
  Filled 2023-10-28: qty 5

## 2023-10-28 MED ORDER — MEDIHONEY WOUND/BURN DRESSING EX PSTE
1.0000 | PASTE | Freq: Every day | CUTANEOUS | Status: DC
  Administered 2023-10-28 – 2023-11-05 (×11): 1 via TOPICAL
  Filled 2023-10-28: qty 44

## 2023-10-28 MED ORDER — DEXTROSE 5 % IV SOLN
56.0000 mg/m2 | Freq: Once | INTRAVENOUS | Status: AC
  Administered 2023-10-28 (×2): 120 mg via INTRAVENOUS
  Filled 2023-10-28: qty 60

## 2023-10-28 MED ORDER — DEXAMETHASONE SODIUM PHOSPHATE 100 MG/10ML IJ SOLN
20.0000 mg | Freq: Once | INTRAMUSCULAR | Status: AC
  Administered 2023-10-28 (×2): 20 mg via INTRAVENOUS
  Filled 2023-10-28: qty 2

## 2023-10-28 MED ORDER — SODIUM CHLORIDE 0.9 % IV SOLN: Freq: Once | INTRAVENOUS | Status: AC

## 2023-10-28 MED ORDER — SODIUM CHLORIDE 0.9 % IV SOLN
400.0000 mg/m2 | Freq: Once | INTRAVENOUS | Status: AC
  Administered 2023-10-28 (×2): 1000 mg via INTRAVENOUS
  Filled 2023-10-28: qty 50

## 2023-10-28 NOTE — Consult Note (Signed)
 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  Apply Medihoney to wound bed, cover with dry gauze and silicone foam dressing. Change daily.   WOC Nurse team will follow with you and see patient within 10 days for wound assessments.  Please notify WOC nurses of any acute changes in the wounds or any new areas of concern Doyal Polite, RN, MSN, [facility] WOC Team

## 2023-10-28 NOTE — Progress Notes (Signed)
 Occupational Therapy Treatment Patient Details Name: Frank [name]. MRN: [id number] DOB: May 07, 1965 Today's Date: 10/28/2023   History of present illness 58 y.o. male transferred from [facility] 07/03/23 to [facility] for management of newly diagnosed plasma cell myeloma with weakness and AMS. Pt also with AKI and metabolic encephalopathy. 5/10 transfer to [facility] for iHD. 4/22 Intubated and chemo initiated. 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 5/6 IVIG started. 5/8-6/11 trach. 5/12 iHD initiated. 5/29 PEG placed. 6/5 chemo initiated. 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 Patient able to perform 4 sit to stands from EOB with patient tolerating 20 - 30 seconds of stand with mod-max assist +2.  Patient excited to work on stand pivot transfers this week. Acute OT to continue to follow with STAR program to address established goals.       If plan is discharge home, recommend the following:  Two people to help with walking and/or transfers;Assistance with cooking/housework;Assistance with feeding;Direct supervision/assist for medications management;Direct supervision/assist for financial management;Assist for transportation;Help with stairs or ramp for entrance;A lot of help with bathing/dressing/bathroom   Equipment Recommendations  Other (comment) (TBD)    Recommendations for Other Services      Precautions / Restrictions Precautions Precautions: Fall Recall of Precautions/Restrictions: Impaired Restrictions Weight Bearing Restrictions Per Provider Order: Yes LUE Weight Bearing Per Provider Order: Non weight bearing Other Position/Activity Restrictions: ROM to tolerance       Mobility Bed Mobility Overal bed mobility: Needs Assistance Bed Mobility: Supine to Sit, Sit to Supine     Supine to sit: Min  assist, +2 for physical assistance Sit to supine: Min assist, +2 for physical assistance   General bed mobility comments: assistance with trunk and BLE to return to supine    Transfers Overall transfer level: Needs assistance Equipment used: Rolling walker (2 wheels) Transfers: Sit to/from Stand Sit to Stand: +2 physical assistance, From elevated surface, Mod assist, Max assist           General transfer comment: patient demonstrated improvement with body mechanics and standing tolerance     Balance Overall balance assessment: Needs assistance Sitting-balance support: Feet supported, No upper extremity supported Sitting balance-Leahy Scale: Fair Sitting balance - Comments: with transfer to and from wheelchair   Standing balance support: Single extremity supported Standing balance-Leahy Scale: Poor Standing balance comment: pt able to static stand for ~30 sec with assist of 2                           ADL either performed or assessed with clinical judgement   ADL Overall ADL's : Needs assistance/impaired     Grooming: Wash/dry hands;Wash/dry face;Set up   Upper Body Bathing: Minimal assistance;Sitting Upper Body Bathing Details (indicate cue type and reason): on EOB Lower Body Bathing: Moderate assistance;Sitting/lateral leans Lower Body Bathing Details (indicate cue type and reason): able to bathe peri area while sitting Upper Body Dressing : Minimal assistance;Sitting   Lower Body Dressing: Minimal assistance;Sitting/lateral leans Lower Body Dressing Details (indicate cue type and reason): to donn slip on shoes               General ADL Comments: patient's bed was wet and performed bathing and dressing seated on EOB    Extremity/Trunk Assessment  Vision       Perception     Praxis     Communication Communication Communication: Impaired Factors Affecting Communication: Reduced clarity of speech   Cognition Arousal:  Alert Behavior During Therapy: WFL for tasks assessed/performed                                 Following commands: Impaired Following commands impaired: Follows one step commands with increased time (needs cues to focus on tasks)      Cueing   Cueing Techniques: Verbal cues, Tactile cues, Visual cues  Exercises      Shoulder Instructions       General Comments patient excited about gains this treatment session    Pertinent Vitals/ Pain       Pain Assessment Pain Assessment: Faces Faces Pain Scale: Hurts even more Pain Location: LUE and right knee Pain Descriptors / Indicators: Grimacing, Guarding Pain Intervention(s): Limited activity within patient's tolerance, Monitored during session, Repositioned  Home Living                                          Prior Functioning/Environment              Frequency  Min 1X/week (on STAR Program)        Progress Toward Goals  OT Goals(current goals can now be found in the care plan section)  Progress towards OT goals: Progressing toward goals  Acute Rehab OT Goals Patient Stated Goal: to go to SNF for more rehab OT Goal Formulation: With patient Time For Goal Achievement: 10/29/23 Potential to Achieve Goals: Fair ADL Goals Pt Will Perform Grooming: sitting;with set-up (A/E PRN) Pt Will Perform Upper Body Dressing: sitting;with contact guard assist;with adaptive equipment Pt Will Perform Lower Body Dressing: sitting/lateral leans;sit to/from stand;with mod assist (using one-handed RUE techniques) Pt Will Transfer to Toilet: with contact guard assist;bedside commode;with transfer board Pt/caregiver will Perform Home Exercise Program: Increased ROM;Left upper extremity;With written HEP provided;Independently Additional ADL Goal #1: Pt will complete bed mobility with min A as a precursor to EOB ADLs Additional ADL Goal #2: Pt will demonstrate ability to stand statically for >3 mins to  demonstrate improving tolerance in prep for OOB mobility Additional ADL Goal #3: Pt will demonstrate independence with sensory stimulation to LUE to improve phantom symptoms.  Plan      Co-evaluation    PT/OT/SLP Co-Evaluation/Treatment: Yes Reason for Co-Treatment: For patient/therapist safety;To address functional/ADL transfers PT goals addressed during session: Mobility/safety with mobility;Balance OT goals addressed during session: ADL's and self-care;Strengthening/ROM      AM-PAC OT 6 Clicks Daily Activity     Outcome Measure   Help from another person eating meals?: A Little Help from another person taking care of personal grooming?: A Little Help from another person toileting, which includes using toliet, bedpan, or urinal?: Total Help from another person bathing (including washing, rinsing, drying)?: A Lot Help from another person to put on and taking off regular upper body clothing?: A Little Help from another person to put on and taking off regular lower body clothing?: A Lot 6 Click Score: 14    End of Session Equipment Utilized During Treatment: Gait belt;Rolling walker (2 wheels)  OT Visit Diagnosis: Unsteadiness on feet (R26.81);Other abnormalities of gait and mobility (R26.89);Muscle weakness (generalized) (M62.81);Pain Pain - Right/Left: Left  Pain - part of body: Shoulder (RLE)   Activity Tolerance Patient tolerated treatment well;Patient limited by pain   Patient Left in bed;with call bell/phone within reach   Nurse Communication Mobility status        Time: 0826-0927 OT Time Calculation (min): 61 min  Charges: OT General Charges $OT Visit: 1 Visit OT Treatments $Self Care/Home Management : 8-22 mins $Therapeutic Activity: 8-22 mins  [name], OTA Acute Rehabilitation Services  Office [phone]   [name] 10/28/2023, 2:02 PM

## 2023-10-28 NOTE — Progress Notes (Signed)
 Progress Note   Patient: Frank [name]. FMW:[id number] DOB: 05-Dec-1965 DOA: 07/03/2023  DOS: the patient was seen and examined on 10/28/2023   Brief hospital course:  58 year old PMH OSA CPAP, pathological fracture and multiple ORIF left Humerus likely due to multiple myeloma, A-fib not on anticoagulation, hyperlipidemia, tobacco use disorder who was feeling weak for 1 week, admitted to [facility] health with concern of sepsis, community-acquired pneumonia and acute encephalopathy. Patient was found to have lytic lesions, he was transferred to [facility] for oncology treatment. In the meantime he developed encephalopathy, acute renal failure. He remained in the ICU for 36 days. Underwent tracheostomy. He was also started on HD after CRRT,off of HD now. Subsequently PEG tube placed 5/29 and IR drain placement of right thigh hip infected hematoma on 7/11 . S/p PEG tube removal. Needs Port-A-Cath placement (for outpatient chemo), either during this hospitalization or as an outpatient. F/u IR recommendations for drain management. Started on heparin  drip on 7/17 because of b/l LE DVT.  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 on 7/28.  Surgical EBL 750 cc. Oncology and IR following.f  Now most issues resolved and awaiting placement in [place].    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  Wound VAC removed 8/1.  Dressing changes, Eliquis  per Ortho.  Some increased redness evaluated today 8/11 by [name].  Initiating on po doxy empirically.  No fever, discharge, leukocytosis.   Infected right flank/gluteal hematoma - S/p IR drain placement 7/11.  Drain exchanged 7/25.  Completed antibiotic course 7/26.  IR following, possible drain removal in the next day or 2, just before discharge.  Multiple myeloma - Port-A-Cath placed 7/30 by IR.  Started chemo with  Velcade /Cytoxan  on 08/21/2023. Chemo changed to Sarclisa /Kyprolis /Cytoxan  later on.  Managed closely by oncology.  Oncology and TOC to work on setting up outpatient chemotherapy in [place] upon discharge.  Will receive chemotherapy today.    Concern for sepsis - Completed vancomycin  6/28.  Completed Zyvox  7/26.  Resolved.  Acute kidney injury on CKD 3A - Secondary to multiple myeloma.  Briefly required HD.  AKI resolved, TDC removed.  Resolved.  Bilateral lower extremity DVT - Eliquis  on board.  Pt will be on life long anticoagulation.  Aspergillosis with pneumonia - Completed voriconazole  8/5.  Acute hypoxic respiratory failure - Trach removed 6/11, decannulated.  Resolved.  Acute metabolic encephalopathy-resolved  Anemia of chronic renal disease - Hemoglobin stable.  Insulin -dependent diabetes mellitus - Continue insulin  sliding scale.  Paroxysmal atrial fibrillation - Rate well-controlled.  Continue verapamil , Eliquis .  Obstructive sleep apnea - CPAP on board.  Dysphagia - Resolved.  PEG tube removed.  On regular diet.  Severe protein calorie malnutrition - Dietary on board.  Goals of care -Working closely with TOC, pt, on disposition planning.  Looking SNF in [place], bed is available and auth approved.  Anticipate DC in 1-2 days.     Subjective: Patient resting comfortably this morning.  Denies any fever, chills, chest pain, nausea, vomiting, abdominal pain.  In good spirits.  About to get chemo this am.  Physical Exam:  Vitals:   10/27/23 2012 10/28/23 0457 10/28/23 0727 10/28/23 0742  BP: 137/88 128/86 (!) 132/98   Pulse: 95 88 83 85  Resp: 18 18  17   Temp: 98.6 F (37 C) 98 F (36.7 C) 98 F (36.7 C)  TempSrc:  Oral Oral   SpO2: 99% 95% 97% 97%  Weight:      Height:        GENERAL:  Alert, pleasant, no acute distress  HEENT:  EOMI CARDIOVASCULAR:  RRR, no murmurs appreciated RESPIRATORY:  Clear to auscultation, no wheezing, rales, or  rhonchi GASTROINTESTINAL:  Soft, nontender, nondistended EXTREMITIES: L UE above elbow amputation, drain in right hip NEURO:  No new focal deficits appreciated SKIN:  No rashes noted PSYCH:  Appropriate mood and affect     Data Reviewed:  Imaging Studies: CT HUMERUS LEFT W CONTRAST Result Date: 10/27/2023 CLINICAL DATA:  Checking for cellulitis. An area of redness. History of multiple myeloma and left upper arm amputation EXAM: CT OF THE UPPER LEFT EXTREMITY WITH CONTRAST TECHNIQUE: Multidetector CT imaging of the left upper arm was performed according to the standard protocol following intravenous contrast administration. RADIATION DOSE REDUCTION: This exam was performed according to the departmental dose-optimization program which includes automated exposure control, adjustment of the mA and/or kV according to patient size and/or use of iterative reconstruction technique. CONTRAST:  OMNIPAQUE  IOHEXOL  350 MG/ML SOLN COMPARISON:  Intraoperative images 10/13/2023, left humeral radiographs 09/15/2023 and CT of the left elbow 07/01/2022. Chest CT 07/26/2023 FINDINGS: Bones/Joint/Cartilage In correlation with prior imaging, patient underwent distal humeral hardware removal and amputation through the mid humeral shaft 10/13/2023. Grossly stable postsurgical changes within the remaining distal humeral shaft from hardware removal with associated chronic periosteal thickening. No gross bone destruction, acute fracture or dislocation identified. Advanced glenohumeral degenerative changes with joint space narrowing, osteophytes and subchondral cyst formation in the glenoid and humeral head. Some of the cystic changes are removed from the subchondral bone and are likely secondary to the patient's multiple myeloma. Additional lytic lesions noted in the tip of the left scapula and left ribs. Small shoulder joint effusion with an ossified loose body in the axillary recess. Mild acromioclavicular degenerative  changes. Ligaments Suboptimally assessed by CT. Muscles and Tendons Mild muscular atrophy. No intramuscular fluid collection or suspicious enhancement identified. Soft tissues Postsurgical changes related to recent amputation. Skin thickening along the amputation. No evidence of organized fluid collection, soft tissue emphysema or unexpected foreign body. There are multiple small solid nodules in the left lung, measuring up to 6 mm in the apex (image 50/4) and 7 mm in the lower lobe (image 113/4). Previous chest CT demonstrated widespread ground-glass opacities throughout both lungs which may have obscured these nodules. IMPRESSION: 1. Postsurgical changes related to recent amputation through the mid humeral shaft. No evidence of abscess or soft tissue emphysema. 2. No acute osseous findings. Grossly stable postsurgical changes in the distal humerus and probable sequela of multiple myeloma. No specific evidence of osteomyelitis. 3. Advanced glenohumeral degenerative changes with small shoulder joint effusion and ossified loose body in the axillary recess. 4. Multiple small solid nodules in the left lung, measuring up to 7 mm in the lower lobe. Previous chest CT demonstrated widespread ground-glass opacities throughout both lungs which may have obscured these nodules. These findings are nonspecific and potentially postinflammatory. Metastatic disease considered less likely, although not completely excluded. Suggest follow-up chest CT due to incomplete visualization. Electronically Signed   By: Elsie Perone M.D.   On: 10/27/2023 09:46   IR SCLEROTHERAPY OF A FLUID COLLECTION Result Date: 10/23/2023 INDICATION: Right hip fluid collection continued. Previous doxycycline  sclerosis performed 10/10/2023. Output still persisting although diminished. Planned second and final attempted sclerosing fluid collection. EXAM: Drain change MEDICATIONS: The patient is currently  admitted to the hospital and receiving intravenous  antibiotics. The antibiotics were administered within an appropriate time frame prior to the initiation of the procedure. ANESTHESIA/SEDATION: Moderate (conscious) sedation was employed during this procedure. A total of Versed  2 mg and Fentanyl  150 mcg was administered intravenously by the radiology nurse. Total intra-service moderate Sedation Time: 25 mL Omnipaque  3 minutes. The patient's level of consciousness and vital signs were monitored continuously by radiology nursing throughout the procedure under my direct supervision. COMPLICATIONS: None immediate. PROCEDURE: Informed written consent was obtained from the patient after a thorough discussion of the procedural risks, benefits and alternatives. All questions were addressed. Maximal Sterile Barrier Technique was utilized including caps, mask, sterile gowns, sterile gloves, sterile drape, hand hygiene and skin antiseptic. A timeout was performed prior to the initiation of the procedure. With the patient in a supine position, external portion of drainage in the right flank were prepped and draped in the usual sterile. Aggressive flushing was normal saline. Significant decrease in overall size the when to previous. When all of flushing began to return mostly clear rest of pocket was evacuated. Short Amplatz wire was then advanced through the catheter and the catheter was completely patient. New 10 catheter was then advanced over the guidewire coiled more medially. Approximately 40 mL of reconstituted doxycycline  was then injected into the catheter and the catheter was capped. Retention suture and sterile dressing applied. The catheter will be reopened to suction approximately 2 hours. That time there will be no more flushing of the catheter. IMPRESSION: Repeat sclerosing right flank cavity using combination fluoroscopy and aggressive flushing with doxycycline . Electronically Signed   By: Cordella Banner   On: 10/23/2023 16:23   IR Catheter Tube Change Result  Date: 10/23/2023 INDICATION: Right hip fluid collection continued. Previous doxycycline  sclerosis performed 10/10/2023. Output still persisting although diminished. Planned second and final attempted sclerosing fluid collection. EXAM: Drain change MEDICATIONS: The patient is currently admitted to the hospital and receiving intravenous antibiotics. The antibiotics were administered within an appropriate time frame prior to the initiation of the procedure. ANESTHESIA/SEDATION: Moderate (conscious) sedation was employed during this procedure. A total of Versed  2 mg and Fentanyl  150 mcg was administered intravenously by the radiology nurse. Total intra-service moderate Sedation Time: 25 mL Omnipaque  3 minutes. The patient's level of consciousness and vital signs were monitored continuously by radiology nursing throughout the procedure under my direct supervision. COMPLICATIONS: None immediate. PROCEDURE: Informed written consent was obtained from the patient after a thorough discussion of the procedural risks, benefits and alternatives. All questions were addressed. Maximal Sterile Barrier Technique was utilized including caps, mask, sterile gowns, sterile gloves, sterile drape, hand hygiene and skin antiseptic. A timeout was performed prior to the initiation of the procedure. With the patient in a supine position, external portion of drainage in the right flank were prepped and draped in the usual sterile. Aggressive flushing was normal saline. Significant decrease in overall size the when to previous. When all of flushing began to return mostly clear rest of pocket was evacuated. Short Amplatz wire was then advanced through the catheter and the catheter was completely patient. New 10 catheter was then advanced over the guidewire coiled more medially. Approximately 40 mL of reconstituted doxycycline  was then injected into the catheter and the catheter was capped. Retention suture and sterile dressing applied. The  catheter will be reopened to suction approximately 2 hours. That time there will be no more flushing of the catheter. IMPRESSION: Repeat sclerosing right flank cavity using  combination fluoroscopy and aggressive flushing with doxycycline . Electronically Signed   By: Cordella Banner   On: 10/23/2023 16:23   IR IMAGING GUIDED PORT INSERTION Result Date: 10/15/2023 INDICATION: 58 year old male with history multiple myeloma requiring central venous access for chemotherapy administration EXAM: IMPLANTED PORT A CATH PLACEMENT WITH ULTRASOUND AND FLUOROSCOPIC GUIDANCE COMPARISON:  None Available. MEDICATIONS: None. ANESTHESIA/SEDATION: Moderate (conscious) sedation was employed during this procedure. A total of Versed  1.5 mg and Fentanyl  75 mcg was administered intravenously. Moderate Sedation Time: 14 minutes. The patient's level of consciousness and vital signs were monitored continuously by radiology nursing throughout the procedure under my direct supervision. CONTRAST:  None FLUOROSCOPY TIME:  Ten mGy reference air kerma COMPLICATIONS: None immediate. PROCEDURE: The procedure, risks, benefits, and alternatives were explained to the patient. Questions regarding the procedure were encouraged and answered. The patient understands and consents to the procedure. The right neck and chest were prepped with chlorhexidine  in a sterile fashion, and a sterile drape was applied covering the operative field. Maximum barrier sterile technique with sterile gowns and gloves were used for the procedure. A timeout was performed prior to the initiation of the procedure. Ultrasound was used to examine the jugular vein which was compressible and free of internal echoes. A skin marker was used to demarcate the planned venotomy and port pocket incision sites. Local anesthesia was provided to these sites and the subcutaneous tunnel track with 1% lidocaine  with 1:100,000 epinephrine . A small incision was created at the jugular access site  and blunt dissection was performed of the subcutaneous tissues. Under ultrasound guidance, the jugular vein was accessed with a 21 ga micropuncture needle and an 0.018 wire was inserted to the superior vena cava. Real-time ultrasound guidance was utilized for vascular access including the acquisition of a permanent ultrasound image documenting patency of the accessed vessel. A 5 Fr micopuncture set was then used, through which a 0.035 Rosen wire was passed under fluoroscopic guidance into the inferior vena cava. An 8 Fr dilator was then placed over the wire. A subcutaneous port pocket was then created along the upper chest wall utilizing a combination of sharp and blunt dissection. The pocket was irrigated with sterile saline, packed with gauze, and observed for hemorrhage. A single lumen ISP sized power injectable port was chosen for placement. The 8 Fr catheter was tunneled from the port pocket site to the venotomy incision. The port was placed in the pocket. The external catheter was trimmed to appropriate length. The dilator was exchanged for an 8 Fr peel-away sheath under fluoroscopic guidance. The catheter was then placed through the sheath and the sheath was removed. Final catheter positioning was confirmed and documented with a fluoroscopic spot radiograph. The port was accessed with a Huber needle, aspirated, and flushed with heparinized saline. The deep dermal layer of the port pocket incision was closed with interrupted 3-0 Vicryl suture. Dermabond was then placed over the port pocket and neck incisions. The patient tolerated the procedure well without immediate post procedural complication. FINDINGS: After catheter placement, the tip lies within the superior cavoatrial junction. The catheter aspirates and flushes normally and is ready for immediate use. IMPRESSION: Successful placement of a power injectable Port-A-Cath via the right internal jugular vein. The catheter is ready for immediate use.  Ester Sides, MD [facility] Radiology Electronically Signed   By: Ester Sides M.D.   On: 10/15/2023 16:30   DG Humerus Left Result Date: 10/13/2023 CLINICAL DATA:  Left upper extremity amputation  through the humerus. Multiple myeloma. EXAM: LEFT HUMERUS - 2+ VIEW COMPARISON:  09/15/2023 FINDINGS: A single C-arm image demonstrates the recently demonstrated lytic changes in the distal humerus and associated fixation hardware. IMPRESSION: Single C-arm image of the distal humerus and associated fixation hardware. Electronically Signed   By: Elspeth Bathe M.D.   On: 10/13/2023 11:05   DG C-Arm 1-60 Min-No Report Result Date: 10/13/2023 Fluoroscopy was utilized by the requesting physician.  No radiographic interpretation.   DG C-Arm 1-60 Min-No Report Result Date: 10/13/2023 Fluoroscopy was utilized by the requesting physician.  No radiographic interpretation.   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 Planned sclerosing. EXAM: Sclerosing of abnormal fluid collection COMPARISON:  None Available. MEDICATIONS: 500 mg doxycycline ; intra procedural ANESTHESIA/SEDATION: Moderate (conscious) sedation was employed during this procedure. A total of Versed  2 mg and Fentanyl  100 mcg was administered intravenously by the radiology nurse. Total intra-service moderate Sedation Time: 11 minutes. The patient's level of consciousness and vital signs were monitored continuously by radiology nursing throughout the procedure under my direct supervision. CONTRAST:  10 mL Omnipaque  300-administered into the collecting system(s) FLUOROSCOPY: Radiation Exposure Index (as provided by the fluoroscopic device): 5 mGy Kerma COMPLICATIONS: None immediate. PROCEDURE: Informed written consent was obtained from the patient after a thorough discussion of the procedural risks, benefits  and alternatives. All questions were addressed. Maximal Sterile Barrier Technique was utilized including caps, mask, sterile gowns, sterile gloves, sterile drape, hand hygiene and skin antiseptic. A timeout was performed prior to the initiation of the procedure. The indwelling catheter in the right hip region prepped and draped in usual sterile fashion. The indwelling 10 French catheter was aggressively irrigated with normal saline and flushed. Dilute contrast was then injected under fluoroscopic guidance into the catheter to evaluate if the fluid collection was communicating with vascular or lymphatic structure. Contrast flows freely into the catheter and outlines the fluid collection which is irregular in appearance, however there is no vascular or lymphatic communication identified. The contrast was then mostly withdrawn. The catheter and retention sutures were cut. A new 10 French catheter was then advanced over a guidewire into the fluid collection and repositioned in a more cephalad location. Approximately 500 mg of doxycycline  was reconstituted with 50 mL of normal saline and then approximately 35 mL of this mixture was injected into the 10 French pigtail catheter. The catheter was then capped. Retention suture and sterile dressing were applied. The doxycycline  will dwell within the cavity for approximately 1 hour and then the catheter will be connected to a JP bulb and the patient will be monitored for output. IMPRESSION: Satisfactory exchange of a 10 French right hip drainage catheter with sclerosing using doxycycline  as described above. Catheter will remain to a JP bulb until output has sufficiently diminished. Electronically Signed   By: Cordella Banner   On: 10/10/2023 17:18   CT PELVIS WO CONTRAST Result Date: 10/07/2023 CLINICAL DATA:  Right hip drain evaluation. EXAM: CT PELVIS WITHOUT CONTRAST TECHNIQUE: Multidetector CT imaging of the pelvis was performed following the standard protocol without  intravenous contrast. RADIATION DOSE REDUCTION: This exam was performed according to the departmental dose-optimization program which includes automated exposure control, adjustment of the mA and/or kV according to patient size and/or use of iterative reconstruction technique. COMPARISON:  CT abdomen pelvis dated 07/08/2023. FINDINGS: Urinary Tract: The visualized ureters appear unremarkable. Punctate stone noted in the posterior bladder lumen. The urinary bladder  is otherwise unremarkable. Bowel:  Moderate stool in the colon.  The appendix is normal. Vascular/Lymphatic: Moderate aortoiliac atherosclerotic disease. No pelvic adenopathy. Reproductive: The prostate and seminal vesicles are grossly unremarkable. Other: Right gluteal pigtail drainage catheter. Significant interval decrease in the size of the hematoma in the right gluteal muscle now measuring approximately 4.7 x 9.3 cm. Additional loculated collection in the anterior upper right thigh significantly decreased in size since the prior CT and measures approximately 10 x 17 cm in greatest axial dimensions. Musculoskeletal: Osteopenia with degenerative changes of the spine. Total left hip arthroplasty. Scattered lucent lesions throughout the bones as seen on the prior CT. IMPRESSION: 1. Right gluteal pigtail drainage catheter with significant interval decrease in the size of the hematoma in the right gluteal muscle. 2. Significant interval decrease in the size of the loculated collection in the anterior upper right thigh. 3. Punctate stone in the posterior bladder lumen. 4.  Aortic Atherosclerosis (ICD10-I70.0). Electronically Signed   By: Vanetta Chou M.D.   On: 10/07/2023 16:53   DG CHEST PORT 1 VIEW Result Date: 10/06/2023 EXAM: 1 VIEW XRAY OF THE CHEST 10/06/2023 08:52:00 PM COMPARISON: 09/28/2023 CLINICAL HISTORY: [id number] SOB (shortness of breath) 141880. sob FINDINGS: LUNGS AND PLEURA: Increased interstitial markings, left greater than right,  favoring atypical infection / pneumonia over interstitial edema. No pleural effusion. No pneumothorax. HEART AND MEDIASTINUM: No acute abnormality of the cardiac and mediastinal silhouettes. BONES AND SOFT TISSUES: Degenerative changes of the bilateral shoulders. No acute osseous abnormality. IMPRESSION: 1. Increased interstitial markings, left greater than right, favoring atypical infection/pneumonia over interstitial edema. Electronically signed by: Pinkie Pebbles MD 10/06/2023 08:58 PM EDT RP Workstation: [id number]   CT FEMUR RIGHT WO CONTRAST Result Date: 10/03/2023 CLINICAL DATA:  Right thigh fluid collection, drain maintenance EXAM: CT OF THE LOWER RIGHT EXTREMITY WITHOUT CONTRAST TECHNIQUE: Multidetector CT imaging of the right lower extremity was performed according to the standard protocol. RADIATION DOSE REDUCTION: This exam was performed according to the departmental dose-optimization program which includes automated exposure control, adjustment of the mA and/or kV according to patient size and/or use of iterative reconstruction technique. COMPARISON:  09/24/2023 FINDINGS: Bones/Joint/Cartilage Stable scattered bone lucencies, see description from the 09/24/2023 exam. Stable right hip arthropathy. Prominent osteoarthritis of the right knee. Left total knee prosthesis partially observed. Large spur like projections from the inferior patella extending into the joint. Moderate knee effusion possibly with synovitis. Suspected free osteochondral fragments in the knee joint. Ligaments N/A Muscles and Tendons Semimembranosus muscular atrophy bilaterally. Soft tissues Reduced size of the fluid collection along the superficial fascia margin of the tensor fascia lata and rectus femoris, currently measuring about 20.9 by 3.0 by 11.8 cm (volume = 390 cm^3). Previous total volume cannot be calculated as this was not completely included, but previous total volume was probably about 3 times is much. Surrounding  subcutaneous edema along the right lateral thigh tracking down to the knee. There is also some subcutaneous edema medially in both thighs. Edema tracks along the superficial fascia margin of the posterior calf musculature in the upper calf region. Prominence of stool in visualized colon, cannot exclude constipation. Atherosclerosis noted. IMPRESSION: 1. Reduced size of the fluid collection along the superficial fascia margin of the tensor fascia lata and rectus femoris, currently measuring about 20.9 by 3.0 by 11.8 cm (volume = 390 cc). Previous total volume cannot be calculated as this was not completely included, but previous total volume was probably about 3 times is much. 2. Surrounding subcutaneous  edema along the right lateral thigh tracking down to the knee. There is also some subcutaneous edema medially in both thighs. Edema tracks along the superficial fascia margin of the posterior calf musculature in the upper calf region. 3. Prominent osteoarthritis of the right knee. Moderate knee effusion possibly with synovitis. Suspected free osteochondral fragments in the knee joint. 4. Prominence of stool in visualized colon, cannot exclude constipation. 5. Atherosclerosis. 6. Stable scattered bone lucencies, see description from the 09/24/2023 exam. Electronically Signed   By: Ryan Salvage M.D.   On: 10/03/2023 08:21   VAS US  LOWER EXTREMITY VENOUS (DVT) Result Date: 10/01/2023  Lower Venous DVT Study Patient Name:  Doron Shake.  Date of Exam:   10/01/2023 Medical Rec #: [id number]           Accession #:    [id number] Date of Birth: 17-Mar-1966           Patient Gender: M Patient Age:   65 years Exam Location:  [facility] Procedure:      VAS US  LOWER EXTREMITY VENOUS (DVT) Referring Phys: DELILIAH RASHID --------------------------------------------------------------------------------  Indications: Right thigh swelling, erythema, pain.  Risk Factors: Extended hospitalization with immobility (3  months). Limitations: Poor ultrasound/tissue interface. Comparison Study: LLEV on 07/18/2023 was negative for DVT. Performing Technologist: Ezzie Potters RVT, RDMS  Examination Guidelines: A complete evaluation includes B-mode imaging, spectral Doppler, color Doppler, and power Doppler as needed of all accessible portions of each vessel. Bilateral testing is considered an integral part of a complete examination. Limited examinations for reoccurring indications may be performed as noted. The reflux portion of the exam is performed with the patient in reverse Trendelenburg.  +---------+---------------+---------+-----------+----------+-------------------+ RIGHT    CompressibilityPhasicitySpontaneityPropertiesThrombus Aging      +---------+---------------+---------+-----------+----------+-------------------+ CFV      Full           Yes      Yes                                      +---------+---------------+---------+-----------+----------+-------------------+ SFJ      Full                                                             +---------+---------------+---------+-----------+----------+-------------------+ FV Prox  Full           Yes      Yes                                      +---------+---------------+---------+-----------+----------+-------------------+ FV Mid   Full           Yes      Yes                                      +---------+---------------+---------+-----------+----------+-------------------+ FV DistalFull           Yes      Yes                                      +---------+---------------+---------+-----------+----------+-------------------+  PFV      Full                                                             +---------+---------------+---------+-----------+----------+-------------------+ POP      Full           Yes      Yes                                       +---------+---------------+---------+-----------+----------+-------------------+ PTV      None           No       No                   Age indeterminate                                                         (one of paired)     +---------+---------------+---------+-----------+----------+-------------------+ PERO     Full                                                             +---------+---------------+---------+-----------+----------+-------------------+   +---------+---------------+---------+-----------+----------+--------------+ LEFT     CompressibilityPhasicitySpontaneityPropertiesThrombus Aging +---------+---------------+---------+-----------+----------+--------------+ CFV      None           No       No                   Acute          +---------+---------------+---------+-----------+----------+--------------+ SFJ      Partial                                      Acute          +---------+---------------+---------+-----------+----------+--------------+ FV Prox  Full           Yes      Yes                                 +---------+---------------+---------+-----------+----------+--------------+ FV Mid   Full           Yes      Yes                                 +---------+---------------+---------+-----------+----------+--------------+ FV DistalFull           Yes      Yes                                 +---------+---------------+---------+-----------+----------+--------------+ PFV      None  No       No                                  +---------+---------------+---------+-----------+----------+--------------+ POP      Full           Yes      Yes                                 +---------+---------------+---------+-----------+----------+--------------+ PTV      Full                                                        +---------+---------------+---------+-----------+----------+--------------+ PERO     Full                                                         +---------+---------------+---------+-----------+----------+--------------+ Proximal CFV compresses, has color, and phasic flow. Mid and distal CFV have occlusive thrombus    Summary: BILATERAL: -No evidence of popliteal cyst, bilaterally. RIGHT: - Findings consistent with age indeterminate deep vein thrombosis involving the right posterior tibial veins.   LEFT: - Findings consistent with acute deep vein thrombosis involving the left common femoral vein, SF junction, and left femoral vein.  - The common femoral vein obstruction does not appear to extend above inguinal ligament.  *See table(s) above for measurements and observations. Electronically signed by Gaile New MD on 10/01/2023 at 5:11:34 PM.    Final    IR GASTROSTOMY TUBE REMOVAL Result Date: 10/01/2023 INDICATION: Gastrostomy tube is no longer needed. EXAM: GASTROSTOMY TUBE REMOVAL MEDICATIONS: None ANESTHESIA/SEDATION: None CONTRAST:  None FLUOROSCOPY: None COMPLICATIONS: None immediate. PROCEDURE: Informed written consent was obtained from the patient after a thorough discussion of the procedural risks, benefits and alternatives. All questions were addressed. A timeout was performed prior to the initiation of the procedure. Balloon retention 18 French gastrostomy tube was intact. 5 cc syringe was used to remove 5 cc of saline from balloon. Gastrostomy tube was removed without difficulty. Site was cleaned and dressed appropriately. IMPRESSION: Successful removal of 18 French balloon retention gastrostomy tube. Performed by Carlin Griffon, PA-C Electronically Signed   By: Juliene Balder M.D.   On: 10/01/2023 16:40   US  RT LOWER EXTREM LTD SOFT TISSUE NON VASCULAR Result Date: 09/30/2023 CLINICAL DATA:  Evaluate for abscess. EXAM: ULTRASOUND RIGHT LOWER EXTREMITY LIMITED TECHNIQUE: Ultrasound examination of the lower extremity soft tissues was performed in the area of clinical concern. COMPARISON:   None Available. FINDINGS: In the region of palpable abnormality in the lateral aspect of the right thigh there is a well-circumscribed is 8.5 x 2.3 x 6.8 cm fluid collection in the subcutaneous tissues. This is 1.8 cm deep to the skin. There is no abnormal vascularity. There is no soft tissue mass. IMPRESSION: 8.5 cm fluid collection in the subcutaneous tissues of the lateral aspect of the right thigh. This may represent a seroma or abscess. Electronically Signed   By: Greig Pique M.D.   On: 09/30/2023 21:47   DG Knee 1-2  Views Right Result Date: 09/29/2023 CLINICAL DATA:  Knee pain and swelling EXAM: RIGHT KNEE - 1-2 VIEW COMPARISON:  X-ray 01/18/2019 FINDINGS: Moderate joint effusion. Moderate joint space loss of the medial compartment and patellofemoral joint. Tricompartmental osteophytes are seen greatest of the patellofemoral joint. Chondrocalcinosis also identified. There is medial translation of the femur relative to the tibia as well. Preserved bone mineralization. IMPRESSION: Advanced tricompartmental degenerative changes. Moderate joint effusion. Chondrocalcinosis Electronically Signed   By: Ranell Bring M.D.   On: 09/29/2023 15:57   DG Ribs Unilateral Left Result Date: 09/28/2023 CLINICAL DATA:  Left-sided rib pain after cough EXAM: LEFT RIBS - 2 VIEW COMPARISON:  Chest x-ray 09/28/2023.  Chest CT 07/26/2023. FINDINGS: Expansile anterolateral left 6 rib lesion is again noted. No displaced fractures are identified. Lungs are grossly clear. IMPRESSION: Expansile anterolateral left 6 rib lesion is again noted. Correlate for point tenderness. No displaced fractures are identified. Electronically Signed   By: Greig Pique M.D.   On: 09/28/2023 21:50    There are no new results to review at this time.  Previous records (including but not limited to H&P, progress notes, nursing notes, TOC management) were reviewed in assessment of this patient.  Labs: CBC: Recent Labs  Lab 10/22/23 0337  10/23/23 0310 10/24/23 0357 10/25/23 0355 10/26/23 0319 10/27/23 0327  WBC 9.0 6.5 5.9 5.6 6.0 5.6  NEUTROABS 7.0 4.0 3.4 3.2 3.8  --   HGB 9.5* 9.5* 9.6* 9.4* 9.3* 9.4*  HCT 30.2* 31.3* 31.7* 29.7* 29.2* 29.6*  MCV 99.7 100.3* 102.6* 99.0 97.3 98.3  PLT 371 338 317 353 365 405*   Basic Metabolic Panel: Recent Labs  Lab 10/23/23 0310 10/24/23 0357 10/25/23 0355 10/26/23 0319 10/27/23 0327  NA 131* 132* 132* 133* 135  K 4.3 4.4 4.5 4.5 4.0  CL 99 100 99 99 100  CO2 24 24 25 26 27   GLUCOSE 128* 89 94 120* 123*  BUN 49* 46* 45* 43* 42*  CREATININE 1.26* 1.24 1.19 1.11 1.12  CALCIUM  8.2* 8.5* 8.6* 8.4* 8.4*  MG 1.9 1.8  --   --   --    Liver Function Tests: Recent Labs  Lab 10/22/23 0337 10/23/23 0310 10/24/23 0357 10/25/23 0355 10/26/23 0319  AST 11* 8* 9* 11* 9*  ALT 7 7 7 7 6   ALKPHOS 60 63 60 64 66  BILITOT 0.3 0.4 0.3 <0.2 0.5  PROT 5.2* 5.4* 5.2* 4.9* 5.0*  ALBUMIN  2.1* 2.3* 2.3* 2.2* 2.2*   CBG: Recent Labs  Lab 10/27/23 1146 10/27/23 1624 10/27/23 2011 10/28/23 0728 10/28/23 1136  GLUCAP 80 195* 150* 100* 121*    Scheduled Meds:  (feeding supplement) PROSource Plus  30 mL Oral TID BM   acyclovir   400 mg Oral BID   apixaban   5 mg Oral BID   collagenase    Topical Daily   docusate sodium   100 mg Oral BID   doxycycline   100 mg Oral Q12H   epoetin  alfa  40,000 Units Subcutaneous Q Wed-1800   feeding supplement  237 mL Oral BID WC   guaiFENesin   600 mg Oral BID   heparin  lock flush  500 Units Intravenous Once   hydrALAZINE   25 mg Oral Q8H   insulin  aspart  0-15 Units Subcutaneous TID AC & HS   insulin  aspart  2 Units Subcutaneous TID AC & HS   insulin  glargine-yfgn  13 Units Subcutaneous Q24H   leptospermum manuka honey  1 Application Topical Daily   lidocaine   1  patch Transdermal Q24H   montelukast   5 mg Oral QHS   multivitamin with minerals  1 tablet Oral Daily   nutrition supplement (JUVEN)  1 packet Oral BID AC   mouth rinse  15 mL Mouth  Rinse 4 times per day   pantoprazole   40 mg Oral BID   PARoxetine   40 mg Oral Daily   senna  1 tablet Oral Daily   umeclidinium-vilanterol  1 puff Inhalation Daily   valproic  acid  750 mg Oral BID   verapamil   120 mg Oral Daily   Continuous Infusions:  sodium chloride  Stopped (10/13/23 0952)   sodium chloride  Stopped (08/28/23 0839)   sodium chloride      PRN Meds:.acetaminophen  (TYLENOL ) oral liquid 160 mg/5 mL **OR** acetaminophen , alum & mag hydroxide-simeth, benzonatate , cyclobenzaprine , HYDROmorphone  (DILAUDID ) injection, hydrOXYzine , ipratropium-albuterol , metoCLOPramide  **OR** metoCLOPramide  (REGLAN ) injection, [START ON 10/30/2023] ondansetron  (ZOFRAN ) IV, oxyCODONE , polyethylene glycol, artificial tears, sodium chloride  flush  Family Communication: None at bedside  Disposition: Status is: Inpatient Remains inpatient appropriate because: See above     Time spent: 35 minutes  Length of inpatient stay: 117 days  Author: Carliss LELON Canales, DO 10/28/2023 2:05 PM  For on call review [email or web address].

## 2023-10-28 NOTE — Plan of Care (Signed)
  Problem: Education: Goal: Knowledge of General Education information will improve Description: Including pain rating scale, medication(s)/side effects and non-pharmacologic comfort measures Outcome: Progressing   Problem: Health Behavior/Discharge Planning: Goal: Ability to manage health-related needs will improve Outcome: Progressing   Problem: Clinical Measurements: Goal: Ability to maintain clinical measurements within normal limits will improve Outcome: Progressing Goal: Will remain free from infection Outcome: Progressing Goal: Diagnostic test results will improve Outcome: Progressing Goal: Respiratory complications will improve Outcome: Progressing Goal: Cardiovascular complication will be avoided Outcome: Progressing   Problem: Activity: Goal: Risk for activity intolerance will decrease Outcome: Progressing   Problem: Nutrition: Goal: Adequate nutrition will be maintained Outcome: Progressing   Problem: Coping: Goal: Level of anxiety will decrease Outcome: Progressing   Problem: Elimination: Goal: Will not experience complications related to bowel motility Outcome: Progressing Goal: Will not experience complications related to urinary retention Outcome: Progressing   Problem: Pain Managment: Goal: General experience of comfort will improve and/or be controlled Outcome: Progressing   Problem: Safety: Goal: Ability to remain free from injury will improve Outcome: Progressing   Problem: Skin Integrity: Goal: Risk for impaired skin integrity will decrease Outcome: Progressing   Problem: Education: Goal: Ability to describe self-care measures that may prevent or decrease complications (Diabetes Survival Skills Education) will improve Outcome: Progressing Goal: Individualized Educational Video(s) Outcome: Progressing   Problem: Coping: Goal: Ability to adjust to condition or change in health will improve Outcome: Progressing   Problem: Fluid  Volume: Goal: Ability to maintain a balanced intake and output will improve Outcome: Progressing   Problem: Health Behavior/Discharge Planning: Goal: Ability to identify and utilize available resources and services will improve Outcome: Progressing Goal: Ability to manage health-related needs will improve Outcome: Progressing   Problem: Metabolic: Goal: Ability to maintain appropriate glucose levels will improve Outcome: Progressing   Problem: Nutritional: Goal: Maintenance of adequate nutrition will improve Outcome: Progressing Goal: Progress toward achieving an optimal weight will improve Outcome: Progressing   Problem: Skin Integrity: Goal: Risk for impaired skin integrity will decrease Outcome: Progressing   Problem: Tissue Perfusion: Goal: Adequacy of tissue perfusion will improve Outcome: Progressing   Problem: Education: Goal: Knowledge of General Education information will improve Description: Including pain rating scale, medication(s)/side effects and non-pharmacologic comfort measures Outcome: Progressing   Problem: Health Behavior/Discharge Planning: Goal: Ability to manage health-related needs will improve Outcome: Progressing   Problem: Clinical Measurements: Goal: Ability to maintain clinical measurements within normal limits will improve Outcome: Progressing Goal: Will remain free from infection Outcome: Progressing Goal: Diagnostic test results will improve Outcome: Progressing Goal: Respiratory complications will improve Outcome: Progressing Goal: Cardiovascular complication will be avoided Outcome: Progressing   Problem: Activity: Goal: Risk for activity intolerance will decrease Outcome: Progressing   Problem: Nutrition: Goal: Adequate nutrition will be maintained Outcome: Progressing   Problem: Coping: Goal: Level of anxiety will decrease Outcome: Progressing   Problem: Elimination: Goal: Will not experience complications related to  bowel motility Outcome: Progressing Goal: Will not experience complications related to urinary retention Outcome: Progressing   Problem: Pain Managment: Goal: General experience of comfort will improve and/or be controlled Outcome: Progressing   Problem: Safety: Goal: Ability to remain free from injury will improve Outcome: Progressing   Problem: Skin Integrity: Goal: Risk for impaired skin integrity will decrease Outcome: Progressing

## 2023-10-28 NOTE — Progress Notes (Signed)
 Chemo RN presented to patient room. Patient verified using two separate identifiers. Role of Chemo RN explained to patient, who was very engaged. Consent for administration of Cytoxan  and Kyprolis  in chart. Verified port tip placement, blood return. Flushed easily. Pre-medications administered. Treatment completed without issue. Port flushed and saline locked. Primary RN made aware that treatment is complete. Patient very grateful for care provided.

## 2023-10-28 NOTE — Progress Notes (Signed)
 [name] is resting this morning.  Overall, I think he has done incredibly well.  We last checked his myeloma studies a few days ago.  His monoclonal spike is now down to 0.5 g/dL.  His IgG level is now normal in 150 mg/dL.  His kappa light chain is down to 1.4 mg/dL.  Again, he had an incredible response to systemic chemotherapy.  He will get another dose today.  It sounds like he might be able to go back up to rehab in [place] this week.  We are trying to make sure he gets arrangements to have treatment as an outpatient at one of the cancer centers up in [place].  His vital signs are all stable.  Temperature 98.  Pulse 88.  Blood pressure 128/86.  Overall, there is no change in his physical exam.  He still has a drain in that right hip.  Hopefully this will come out soon.  At this point, we are just awaiting for him to be moved up to Rehab closer to home.  I know that he we will get wonderful care from the oncologist up in [place] .  I know it will be a lot easier for him to be treated closer to home.  He has only been in the hospital for over 4 months.  He really has come an incredible long way.  I appreciate everybody's help with him.   Jeralyn Crease, MD  [place] 7:7-8

## 2023-10-28 NOTE — TOC Progression Note (Addendum)
 Transition of Care (TOC) - Progression Note    Patient Details  Name: Frank [name]. MRN: [id number] Date of Birth: [birth year]  Transition of Care [facility]) CM/SW Contact  Lauraine FORBES Saa, LCSWA Phone Number: 10/28/2023, 9:26 AM  Clinical Narrative:     9:26 AM Patient's SNF insurance authorization was approved and is valid 10/29/2023-10/31/2023.   12:07 PM CSW informed medical team of insurance authorization approval.  [facility] LPN informed medical team that referral and medical records have been sent to [facility] in [place]. CSW relayed above information to SNF.   12:40 PM Per SNF, they are unable to admit patient until [facility] referral has been approved. CSW relayed information to medical team.  2:04 PM Per [facility] RN, patient's records were set to [facility] yesterday late afternoon. RN stated that SNF may have to call [facility] to schedule appointment because clinics do not typically schedule chemotherapy for a patient without first seeing them and confirming plan. SNF and medical team made aware.  3:51 PM SNF informed CSW patient stated his home CPAP is broken and has not used it since admission, therefore, discharge summary needs to say CPAP not being used and oxygen as needed if patient not discharging with CPAP. Patient made aware drain needs to be removed and outpatient chemotherapy clinic needs to be approved prior to discharge. CSW relayed information to MD who is to discharge patient to SNF with CPAP order. CSW relayed patient need of CPAP at discharge to SNF who stated they need to know settings and size of face mask. SNF also informed CSW of patient's hesitancy to tolerate CPAP due to experiencing claustrophobia since admission. CSW relayed information to medical team.   Expected Discharge Plan: Skilled Nursing Facility Barriers to Discharge: Continued  Medical Work up, English as a second language teacher               Expected Discharge Plan and Services In-house Referral: Clinical Social Work Discharge Planning Services: [employer] [facility] Post Acute Care Choice: Skilled Nursing Facility Living arrangements for the past 2 months: Single Family Home                 DME Arranged: N/A DME Agency: NA       HH Arranged: NA HH Agency: NA         Social Drivers of Health (SDOH) Interventions SDOH Screenings   Food Insecurity: No Food Insecurity (07/05/2023)  Housing: Low Risk  (07/05/2023)  Transportation Needs: No Transportation Needs (07/05/2023)  Utilities: Not At Risk (07/05/2023)  Tobacco Use: High Risk (10/13/2023)    Readmission Risk Interventions     No data to display

## 2023-10-28 NOTE — Plan of Care (Signed)
 Problem: Education: Goal: Knowledge of General Education information will improve Description: Including pain rating scale, medication(s)/side effects and non-pharmacologic comfort measures Outcome: Progressing   Problem: Health Behavior/Discharge Planning: Goal: Ability to manage health-related needs will improve Outcome: Progressing   Problem: Clinical Measurements: Goal: Ability to maintain clinical measurements within normal limits will improve Outcome: Progressing Goal: Will remain free from infection Outcome: Progressing Goal: Diagnostic test results will improve Outcome: Progressing Goal: Respiratory complications will improve Outcome: Progressing Goal: Cardiovascular complication will be avoided Outcome: Progressing   Problem: Activity: Goal: Risk for activity intolerance will decrease Outcome: Progressing   Problem: Nutrition: Goal: Adequate nutrition will be maintained Outcome: Progressing   Problem: Coping: Goal: Level of anxiety will decrease Outcome: Progressing   Problem: Elimination: Goal: Will not experience complications related to bowel motility Outcome: Progressing Goal: Will not experience complications related to urinary retention Outcome: Progressing   Problem: Pain Managment: Goal: General experience of comfort will improve and/or be controlled Outcome: Progressing   Problem: Safety: Goal: Ability to remain free from injury will improve Outcome: Progressing   Problem: Skin Integrity: Goal: Risk for impaired skin integrity will decrease Outcome: Progressing   Problem: Education: Goal: Ability to describe self-care measures that may prevent or decrease complications (Diabetes Survival Skills Education) will improve Outcome: Progressing Goal: Individualized Educational Video(s) Outcome: Progressing   Problem: Coping: Goal: Ability to adjust to condition or change in health will improve Outcome: Progressing   Problem: Fluid  Volume: Goal: Ability to maintain a balanced intake and output will improve Outcome: Progressing   Problem: Health Behavior/Discharge Planning: Goal: Ability to identify and utilize available resources and services will improve Outcome: Progressing Goal: Ability to manage health-related needs will improve Outcome: Progressing   Problem: Metabolic: Goal: Ability to maintain appropriate glucose levels will improve Outcome: Progressing   Problem: Nutritional: Goal: Maintenance of adequate nutrition will improve Outcome: Progressing Goal: Progress toward achieving an optimal weight will improve Outcome: Progressing   Problem: Skin Integrity: Goal: Risk for impaired skin integrity will decrease Outcome: Progressing   Problem: Tissue Perfusion: Goal: Adequacy of tissue perfusion will improve Outcome: Progressing   Problem: Education: Goal: Knowledge about tracheostomy care/management will improve Outcome: Progressing   Problem: Activity: Goal: Ability to tolerate increased activity will improve Outcome: Progressing   Problem: Health Behavior/Discharge Planning: Goal: Ability to manage tracheostomy will improve Outcome: Progressing   Problem: Education: Goal: Knowledge of General Education information will improve Description: Including pain rating scale, medication(s)/side effects and non-pharmacologic comfort measures Outcome: Progressing   Problem: Health Behavior/Discharge Planning: Goal: Ability to manage health-related needs will improve Outcome: Progressing   Problem: Clinical Measurements: Goal: Ability to maintain clinical measurements within normal limits will improve Outcome: Progressing Goal: Will remain free from infection Outcome: Progressing Goal: Diagnostic test results will improve Outcome: Progressing Goal: Respiratory complications will improve Outcome: Progressing Goal: Cardiovascular complication will be avoided Outcome: Progressing    Problem: Activity: Goal: Risk for activity intolerance will decrease Outcome: Progressing   Problem: Nutrition: Goal: Adequate nutrition will be maintained Outcome: Progressing   Problem: Coping: Goal: Level of anxiety will decrease Outcome: Progressing   Problem: Elimination: Goal: Will not experience complications related to bowel motility Outcome: Progressing Goal: Will not experience complications related to urinary retention Outcome: Progressing   Problem: Pain Managment: Goal: General experience of comfort will improve and/or be controlled Outcome: Progressing   Problem: Safety: Goal: Ability to remain free from injury will improve Outcome: Progressing   Problem: Skin Integrity: Goal: Risk for impaired  skin integrity will decrease Outcome: Progressing

## 2023-10-28 NOTE — Progress Notes (Signed)
 Nutrition Follow-up  DOCUMENTATION CODES:   Severe malnutrition in context of acute illness/injury  INTERVENTION:   - Continue Ensure Plus High Protein po BID, each supplement provides 350 kcal and 20 grams of protein  - Continue PROSource Plus 30 mL TID, each supplement provides 100 kcal and 15 grams of protein  - Continue Juven po BID to support wound healing, each packet provides 95 kcal and 2.5 grams of protein  - Continue MVI with minerals daily  NUTRITION DIAGNOSIS:   Severe Malnutrition related to acute illness (prolonged illness, interuptions in feeding) as evidenced by moderate fat depletion, moderate muscle depletion, percent weight loss (9.6% x 1 month).  Ongoing, being addressed via oral nutrition supplements  GOAL:   Patient will meet greater than or equal to 90% of their needs  Progressing  MONITOR:   PO intake, Supplement acceptance, Diet advancement  REASON FOR ASSESSMENT:   Consult Assessment of nutrition requirement/status  ASSESSMENT:   58 y.o. male with PMH of obesity, asthma, OSA on CPAP, GERD, HTN, HLD who presented due to feeling weak with AMS for a few days. Admitted with concern for sepsis, community-acquired pneumonia, encephalopathy as well as concern for multiple myeloma.  4/17 - admit to [facility] 4/21 - SLP evaluation with recommendations for NPO, IR attempted Cortrak but unable to place, CCM MD successfully placed NG tube 4/22 - concern for aspiration overnight, intubated 4/23 - CRRT 5/08 - s/p tracheostomy 5/10 - CRRT discontinued 5/11 - transferred to [facility] 5/12 - iHD initiated  5/29 - PEG placed 5/31 - chemotherapy changed to Sarclisa /Kyprolis /Cytoxan  6/09 - MBS, NPO recommended  6/11 - trach removed 6/13 - BSE, NPO, tunneled HD catheter removed for line holiday 6/16 - tunneled HD replaced,  6/18 - FEES, thin liquids when supervised 6/19 - BSE, clear liquids 6/21 - BSE, clear liquids 6/23 - MBS, clear liquids, pt not  able to fully participate in study due to pain, no HD since 6/16, line pulled (pt showing signs of renal recovery) 6/24 - FEES, thin liquids and puree 6/27 - BSE with recommendation for dysphagia 1 diet and thin liquids 7/10 - pt with R hip/thigh hematoma 7/11 - s/p BSE with recommendation for dysphagia 2 diet with thin liquids 7/14 - s/p BSE with recommendation for dysphagia 3 diet with thin liquids 7/16 - advanced to regular diet per MD (oncology), PEG tube removed 7/28 - L arm amputation through humerus 7/30 - s/p Port-A-Cath placement with IR 8/01 - VAC to LUE removed 8/05 - chemotherapy restarted 8/07 - s/p repeat sclerotherapy for infected R flank/gluteal hematoma  Noted plan for discharge to rehab facility closer to home in the next 1-2 days.  Pt currently on a Carb Modified diet with excellent PO intake. Pt also appears to be accepting all supplements ordered including PROSource Plus, Juven, and Ensure Plus High Protein. Will continue current nutrition regimen.  Last available weight is from 10/23/23. Weight up from 102 kg on 10/18/23 to 115 kg on 10/23/23. Suspect weight fluctuations are due in part to fluid status. Will continue to monitor weight trends.  Meal Completion: 100% x last 8 documented meal completions  Medications reviewed and include: PROSource Plus 30 mL TID between meals, acyclovir , colace, Epogen  injection weekly, Ensure Plus High Protein BID, SSI TID with meals and at bedtime, Novolog  2 units TID with meals and at bedtime, Semglee  13 units daily, MVI with minerals, Juven BID, protonix , senna  Labs reviewed: BUN 42, hemoglobin 9.4 CBG's: 100-195 x 24 hours  UOP: 2800 mL x 24 hours R hip JP drain: 70 mL x 24 hours  Diet Order:   Diet Order             Diet Carb Modified Fluid consistency: Thin; Room service appropriate? Yes  Diet effective now                   EDUCATION NEEDS:   Not appropriate for education at this time  Skin:  Skin  Assessment: Skin Integrity Issues: Stage II: L buttocks Unstageable: bilateral heels, sacrum Incisions: L arm Other: pressure injury to bilateral buttocks  Last BM:  10/24/23  Height:   Ht Readings from Last 1 Encounters:  10/01/23 6' (1.829 m)    Weight:   Wt Readings from Last 1 Encounters:  10/23/23 115 kg    Ideal Body Weight:  80.9 kg  BMI:  Body mass index is 34.38 kg/m.  Estimated Nutritional Needs:   Kcal:  2400-2700 kcal/d  Protein:  130-150g/d  Fluid:  2.5L/d    Mallie Satchel, MS, RD, LDN Registered Dietitian II Please see AMiON for contact information.

## 2023-10-28 NOTE — Progress Notes (Signed)
 Physical Therapy Treatment Patient Details Name: Frank [name]. MRN: [id number] DOB: Mar 08, 1966 Today's Date: 10/28/2023   History of Present Illness 58 y.o. male transferred from [facility] 07/03/23 to [facility] for management of newly diagnosed plasma cell myeloma with weakness and AMS. Pt also with AKI and metabolic encephalopathy. 5/10 transfer to [facility] for iHD. 4/22 Intubated and chemo initiated. 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 5/6 IVIG started. 5/8-6/11 trach. 5/12 iHD initiated. 5/29 PEG placed. 6/5 chemo initiated. 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 Given progression, PT/OT will work on stand pivot transfer from bed to wheelchair in next session.    If plan is discharge home, recommend the following: Two people to help with walking and/or transfers;Assistance with cooking/housework;Assist for transportation;Two people to help with bathing/dressing/bathroom;Direct supervision/assist for medications management;Direct supervision/assist for financial management;Help with stairs or ramp for entrance;Assistance with feeding   Can travel by private vehicle     No  Equipment Recommendations  Hospital bed;Hoyer lift;Wheelchair (measurements PT);Wheelchair cushion (measurements PT);BSC/3in1    Recommendations for Other Services       Precautions / Restrictions Precautions Precautions: Fall Recall of Precautions/Restrictions: Impaired Restrictions Weight Bearing Restrictions Per Provider Order: Yes LUE Weight Bearing Per Provider Order: Non weight bearing Other Position/Activity Restrictions: ROM to tolerance     Mobility  Bed  Mobility Overal bed mobility: Needs Assistance Bed Mobility: Sit to Supine     Supine to sit: Min assist, +2 for physical assistance     General bed mobility comments: pt able to initiate trunk to bed surface and LE back into bed but ultimately needs min Ax2    Transfers Overall transfer level: Needs assistance Equipment used: Rolling walker (2 wheels) Transfers: Sit to/from Stand Sit to Stand: +2 physical assistance, From elevated surface, Mod assist, Max assist           General transfer comment: pt continues to progress his standing tolerance, decreasing level of assist needed and duration of standing, pt able to achieve 4x STS 2 of which resulted in standing >30 sec      Balance Overall balance assessment: Needs assistance Sitting-balance support: Feet supported, No upper extremity supported Sitting balance-Leahy Scale: Fair Sitting balance - Comments: with transfer to and from wheelchair   Standing balance support: Single extremity supported Standing balance-Leahy Scale: Poor Standing balance comment: pt able to static stand for ~30 sec with assist of 2                            Communication Communication Communication: Impaired Factors Affecting Communication: Reduced clarity of speech  Cognition Arousal: Alert Behavior During Therapy: WFL for tasks assessed/performed                           PT - Cognition Comments: continues to need redirection back to task at hand Following commands: Impaired Following commands impaired: Follows one step commands with increased time (needs increased cuing to not talk and concentrate on command completion)    Cueing Cueing Techniques: Verbal cues, Tactile cues, Visual cues  General Comments General comments (skin integrity, edema, etc.): pt is so pleased with his progress and is thrilled to be standing again, Pt to have chemo session today.      Pertinent Vitals/Pain Pain Assessment Pain  Assessment: Faces Faces Pain Scale: Hurts even more Pain Location: LUE and right knee Pain Descriptors / Indicators: Grimacing, Guarding Pain Intervention(s): Limited activity within patient's tolerance, Monitored during session, Repositioned     PT Goals (current goals can now be found in the care plan section) Acute Rehab PT Goals PT Goal Formulation: With patient Time For Goal Achievement: 11/06/23 Potential to Achieve Goals: Fair Progress towards PT goals: Progressing toward goals    Frequency    Min 5X/week       Co-evaluation PT/OT/SLP Co-Evaluation/Treatment: Yes Reason for Co-Treatment: For patient/therapist safety;To address functional/ADL transfers PT goals addressed during session: Mobility/safety with mobility;Balance OT goals addressed during session: ADL's and self-care;Strengthening/ROM      AM-PAC PT 6 Clicks Mobility   Outcome Measure  Help needed turning from your back to your side while in a flat bed without using bedrails?: A Lot Help needed moving from lying on your back to sitting on the side of a flat bed without using bedrails?: Total Help needed moving to and from a bed to a chair (including a wheelchair)?: Total Help needed standing up from a chair using your arms (e.g., wheelchair or bedside chair)?: Total Help needed to walk in hospital room?: Total Help needed climbing 3-5 steps with a railing? : Total 6 Click Score: 7    End of Session Equipment Utilized During Treatment: Gait belt Activity Tolerance: Patient tolerated treatment well;Patient limited by pain Patient left: with call bell/phone within reach;in bed Nurse Communication: Mobility status PT Visit Diagnosis: Other abnormalities of gait and mobility (R26.89);Muscle weakness (generalized) (M62.81);Difficulty in walking, not elsewhere classified (R26.2)     Time: 9167-9074 PT Time Calculation (min) (ACUTE ONLY): 53 min  Charges:    $Therapeutic Activity: 23-37 mins PT  General Charges $$ ACUTE PT VISIT: 1 Visit                     Frank [name] B. Fleeta Lapidus PT, DPT Acute Rehabilitation Services Please use secure chat or  Call Office [phone]    [name] 10/28/2023, 1:21 PM

## 2023-10-29 ENCOUNTER — Encounter: Payer: Self-pay | Admitting: *Deleted

## 2023-10-29 DIAGNOSIS — R6521 Severe sepsis with septic shock: Secondary | ICD-10-CM | POA: Diagnosis not present

## 2023-10-29 DIAGNOSIS — A419 Sepsis, unspecified organism: Secondary | ICD-10-CM | POA: Diagnosis not present

## 2023-10-29 LAB — GLUCOSE, CAPILLARY
Glucose-Capillary: 154 mg/dL — ABNORMAL HIGH (ref 70–99)
Glucose-Capillary: 141 mg/dL — ABNORMAL HIGH (ref 70–99)
Glucose-Capillary: 106 mg/dL — ABNORMAL HIGH (ref 70–99)
Glucose-Capillary: 120 mg/dL — ABNORMAL HIGH (ref 70–99)

## 2023-10-29 MED ORDER — GABAPENTIN 100 MG PO CAPS
200.0000 mg | ORAL_CAPSULE | Freq: Every day | ORAL | Status: DC
  Administered 2023-10-29 – 2023-11-06 (×10): 200 mg via ORAL
  Filled 2023-10-29 (×9): qty 2

## 2023-10-29 MED ORDER — OXYCODONE HCL 5 MG PO TABS: 10.0000 mg | ORAL_TABLET | ORAL | Status: DC | PRN

## 2023-10-29 NOTE — Progress Notes (Signed)
 Orthopaedic Trauma Progress Note  SUBJECTIVE: Doing okay today.  Has been trying to wean off IV pain medication. Notes his left arm is most sore over the medial and mis portion of the residual limb. Minimal pain laterally. Patient's wife unfortunately got diagnosed with a DVT yesterday, he is hoping to be able to stay in the hospital for a few more days before discharging back to [facility]  OBJECTIVE:  Vitals:   10/29/23 1137 10/29/23 1519  BP: 113/86 114/71  Pulse: 89 90  Resp: 18 18  Temp: 97.8 F (36.6 C) 98.4 F (36.9 C)  SpO2: 96% 94%    Opiates Today (MME): Today's  total administered Morphine  Milligram Equivalents: 65 Opiates Yesterday (MME): Yesterday's total administered Morphine  Milligram Equivalents: 125  General: Sitting up in bed.  No acute distress.   Respiratory: No increased work of breathing.  Operative Extremity (LUE): Dressing changed, incision with some serosanguinous drainage from midportion. Was able to remove suture around lateral portion of incision. Continues to have redness over this area but appears more like suture irritation. No areas of wound dehiscence. All wound edges well approximated. Nontender over the shoulder. Increased tenderness over medial aspect of incision and into axilla.    LABS:  Results for orders placed or performed during the hospital encounter of 07/03/23 (from the past 24 hours)  Glucose, capillary     Status: Abnormal   Collection Time: 10/28/23  3:51 PM  Result Value Ref Range   Glucose-Capillary 182 (H) 70 - 99 mg/dL  Glucose, capillary     Status: Abnormal   Collection Time: 10/28/23  8:28 PM  Result Value Ref Range   Glucose-Capillary 155 (H) 70 - 99 mg/dL  Glucose, capillary     Status: Abnormal   Collection Time: 10/29/23  7:38 AM  Result Value Ref Range   Glucose-Capillary 154 (H) 70 - 99 mg/dL   Comment 1 Notify RN   Glucose, capillary     Status: Abnormal   Collection Time: 10/29/23 11:47 AM  Result Value Ref Range    Glucose-Capillary 141 (H) 70 - 99 mg/dL   Comment 1 Notify RN     ASSESSMENT: Frank [name]. is a 58 y.o. male, 16 Days Post-Op s/p LEFT TRANSHUMERAL AMPUTATION   CV/Blood loss: Hgb 9.4 today. Stable. Hemodynamically stable  PLAN: Weightbearing: NWB LUE ROM: Unrestricted motion as tolerated Incisional and dressing care: Wash LUE incision daily with soap and water . Change dressing daily with gauze, kerlix, ace wrap. Ok to leave open to air if no drainage Orthopedic device(s): None Pain management: Continue current multimodal regimen VTE treatment: Eliquis .  Continue SCDs ID: Plan to start on Doxycycline  today Foley/Lines:  No foley, KVO IVFs Dispo: Continue Doxycycline  BID. Continue to monitor incision and change dressing every day. Leave remaining sutures in place for now, ortho team will manage suture removal. PT/OT as able.  Continue care per primary team and oncology.   Follow - up plan: 2 weeks after d/c for wound check    Contact information:  [name], [name] PA-C. After hours and holidays please check [email or web address] for group call information for [facility]   [name], PA-C [phone] (office) [email or web address]

## 2023-10-29 NOTE — Progress Notes (Signed)
 Assisted patient with call out for pain medication. Primary RN assisting other at the time. Upon assessment of pain and patients frequently increasing pain review of home medications, pt takes gabapentin  at home high doses. Not ordered in patient at this time. Pt reports the pain in nerve pain and possibly phantom pain as well. Pt was very anxious and atarax  does not appear to help the patient with control of his anxiety to therapeutic level. Pt is home klonopin  at night at home that he is not getting inpatient. Possibly resuming home medications if not contraindicated would benefit patient for better pain control and anxiety control surrounding his pain.

## 2023-10-29 NOTE — Progress Notes (Signed)
 [name] is doing okay.  He had chemotherapy yesterday.  He tolerated this very well.  Again, chemotherapy has worked incredibly well for him.  His last myeloma studies show an remarkable decrease in his protein in his M spike.  His monoclonal spike is now 0.5 g/dL.  His IgG level is now down to 950 mg/dL.  The Kappa light chain is now down to 1.4 mg/dL.  I think is doing some physical therapy.  He is eating okay.  There is no nausea or vomiting.  He has had no problems with pain.  He still has a drain in his right hip.  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  Unfortunately, his wife was recently diagnosed with blood clots.  As such, she is not going be able to take care of him right now.  He has had no problems with bowels or bladder.  He has had no fever.  He has had no cough.  He has had no bleeding.  Vital signs show temperature of 98.5.  Pulse 82.  Blood pressure 119/81.  Head and neck exam shows no ocular or oral lesions.  There are no palpable cervical or supraclavicular lymph nodes.  Lungs are clear bilaterally.  He has good air movement bilaterally.  Cardiac exam regular rate and rhythm.  There are no murmurs, rubs or bruits.  Abdomen is soft.  He is a little bit obese.  There is no fluid wave.  There is no palpable liver or spleen tip.  Extremities shows no clubbing, cyanosis or edema.  Neurological exam shows no focal neurological deficits.  The left upper extremity is amputated.  Again, I would like to hope that Mr. Klemann will be able to be transferred up to [facility] this week.  We will make sure he has arrangements for outpatient chemotherapy at a cancer center in [place].  Again he has responded incredibly well to this protocol.  I am just happy that he would be able to be moved closer to home now.  It would be somewhat easier for he and his family.   Jeralyn Crease, MD  Alfonso 1:7

## 2023-10-29 NOTE — Progress Notes (Signed)
 PT Cancellation Note  Patient Details Name: Frank [name]. MRN: [id number] DOB: Nov 21, 1965   Cancelled Treatment:    Reason Eval/Treat Not Completed: (P) Other (comment) Pt reports not feeling well and requests PT/OT return tomorrow.  Ana Woodroof B. Fleeta Lapidus PT, DPT Acute Rehabilitation Services Please use secure chat or  Call Office [phone]    [name] 10/29/2023, 1:08 PM

## 2023-10-29 NOTE — TOC Progression Note (Signed)
 Transition of Care (TOC) - Progression Note    Patient Details  Name: Frank [name]. MRN: [id number] Date of Birth: 1965/08/21  Transition of Care [facility]) CM/SW Contact  Lauraine FORBES Saa, LCSWA Phone Number: 10/29/2023, 2:20 PM  Clinical Narrative:     2:21 PM Medical team informed by [facility] RN Oncology Navigator that [facility] confirmed receipt of patient's referral and are to present it to their MD. Clinic MD to inform when appointment can be scheduled (approximately one to two weeks). Patient will be contact directly by the clinic. CSW relayed information to [facility]. RNCM informed CSW that patient's DME company, [employer], stated that patient has not ordered equipment from them in four years and would a new order. CSW relayed information to medical team and reiterated patient's need of discharging with CPAP or discharge summary saying CPAP not used and oxygen is as needed. CSW will continue to follow and be available to assist.  Expected Discharge Plan: Skilled Nursing Facility Barriers to Discharge: Continued Medical Work up, English as a second language teacher               Expected Discharge Plan and Services In-house Referral: Clinical Social Work Discharge Planning Services: [employer] Consult Post Acute Care Choice: Skilled Nursing Facility Living arrangements for the past 2 months: Single Family Home                 DME Arranged: N/A DME Agency: NA       HH Arranged: NA HH Agency: NA         Social Drivers of Health (SDOH) Interventions SDOH Screenings   Food Insecurity: No Food Insecurity (07/05/2023)  Housing: Low Risk  (07/05/2023)  Transportation Needs: No Transportation Needs (07/05/2023)  Utilities: Not At Risk (07/05/2023)  Tobacco Use: High Risk (10/13/2023)    Readmission Risk Interventions     No data to display

## 2023-10-29 NOTE — Plan of Care (Signed)
 Problem: Education: Goal: Knowledge of General Education information will improve Description: Including pain rating scale, medication(s)/side effects and non-pharmacologic comfort measures Outcome: Progressing   Problem: Health Behavior/Discharge Planning: Goal: Ability to manage health-related needs will improve Outcome: Progressing   Problem: Clinical Measurements: Goal: Ability to maintain clinical measurements within normal limits will improve Outcome: Progressing Goal: Will remain free from infection Outcome: Progressing Goal: Diagnostic test results will improve Outcome: Progressing Goal: Respiratory complications will improve Outcome: Progressing Goal: Cardiovascular complication will be avoided Outcome: Progressing   Problem: Activity: Goal: Risk for activity intolerance will decrease Outcome: Progressing   Problem: Nutrition: Goal: Adequate nutrition will be maintained Outcome: Progressing   Problem: Coping: Goal: Level of anxiety will decrease Outcome: Progressing   Problem: Elimination: Goal: Will not experience complications related to bowel motility Outcome: Progressing Goal: Will not experience complications related to urinary retention Outcome: Progressing   Problem: Pain Managment: Goal: General experience of comfort will improve and/or be controlled Outcome: Progressing   Problem: Safety: Goal: Ability to remain free from injury will improve Outcome: Progressing   Problem: Skin Integrity: Goal: Risk for impaired skin integrity will decrease Outcome: Progressing   Problem: Education: Goal: Ability to describe self-care measures that may prevent or decrease complications (Diabetes Survival Skills Education) will improve Outcome: Progressing Goal: Individualized Educational Video(s) Outcome: Progressing   Problem: Coping: Goal: Ability to adjust to condition or change in health will improve Outcome: Progressing   Problem: Fluid  Volume: Goal: Ability to maintain a balanced intake and output will improve Outcome: Progressing   Problem: Health Behavior/Discharge Planning: Goal: Ability to identify and utilize available resources and services will improve Outcome: Progressing Goal: Ability to manage health-related needs will improve Outcome: Progressing   Problem: Metabolic: Goal: Ability to maintain appropriate glucose levels will improve Outcome: Progressing   Problem: Nutritional: Goal: Maintenance of adequate nutrition will improve Outcome: Progressing Goal: Progress toward achieving an optimal weight will improve Outcome: Progressing   Problem: Skin Integrity: Goal: Risk for impaired skin integrity will decrease Outcome: Progressing   Problem: Tissue Perfusion: Goal: Adequacy of tissue perfusion will improve Outcome: Progressing   Problem: Education: Goal: Knowledge about tracheostomy care/management will improve Outcome: Progressing   Problem: Activity: Goal: Ability to tolerate increased activity will improve Outcome: Progressing   Problem: Health Behavior/Discharge Planning: Goal: Ability to manage tracheostomy will improve Outcome: Progressing   Problem: Education: Goal: Knowledge of General Education information will improve Description: Including pain rating scale, medication(s)/side effects and non-pharmacologic comfort measures Outcome: Progressing   Problem: Health Behavior/Discharge Planning: Goal: Ability to manage health-related needs will improve Outcome: Progressing   Problem: Clinical Measurements: Goal: Ability to maintain clinical measurements within normal limits will improve Outcome: Progressing Goal: Will remain free from infection Outcome: Progressing Goal: Diagnostic test results will improve Outcome: Progressing Goal: Respiratory complications will improve Outcome: Progressing Goal: Cardiovascular complication will be avoided Outcome: Progressing    Problem: Activity: Goal: Risk for activity intolerance will decrease Outcome: Progressing   Problem: Nutrition: Goal: Adequate nutrition will be maintained Outcome: Progressing   Problem: Coping: Goal: Level of anxiety will decrease Outcome: Progressing   Problem: Elimination: Goal: Will not experience complications related to bowel motility Outcome: Progressing Goal: Will not experience complications related to urinary retention Outcome: Progressing   Problem: Pain Managment: Goal: General experience of comfort will improve and/or be controlled Outcome: Progressing   Problem: Safety: Goal: Ability to remain free from injury will improve Outcome: Progressing   Problem: Skin Integrity: Goal: Risk for impaired  skin integrity will decrease Outcome: Progressing

## 2023-10-29 NOTE — Progress Notes (Addendum)
   10/29/23 1201  OT Visit Information  History of Present Illness 58 y.o. male transferred from [facility] 07/03/23 to [facility] for management of newly diagnosed plasma cell myeloma with weakness and AMS. Pt also with AKI and metabolic encephalopathy. 5/10 transfer to [facility] for iHD. 4/22 Intubated and chemo initiated. 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 5/6 IVIG started. 5/8-6/11 trach. 5/12 iHD initiated. 5/29 PEG placed. 6/5 chemo initiated. 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 PMHx:Lt THA, carpal tunnel syndrome, Lt TKA, Lt humerus fx, PAF, HTN, HLD, bipolar disorder, obesity, T2DM  Precautions  Precautions Fall  Recall of Precautions/Restrictions Impaired  Precaution/Restrictions Comments plan for amputation, Dr Kendal okay with arm in sling  Required Braces or Orthoses Sling  Restrictions  Weight Bearing Restrictions Per Provider Order Yes  LUE Weight Bearing Per Provider Order NWB  Other Position/Activity Restrictions ROM to tolerance  Pain Assessment  Faces Pain Scale 6  Pain Location LUE amputation  Pain Descriptors / Indicators Aching;Discomfort;Guarding  Cognition  Arousal Alert  Behavior During Therapy [facility] for tasks assessed/performed  Following Commands  Following commands Impaired  Cueing  Cueing Techniques Verbal cues;Tactile cues;Visual cues  Communication  Communication Impaired  Factors Affecting Communication Reduced clarity of speech  OT Goal Progression  Progress towards OT goals Goals updated;Progressing toward goals  Acute Rehab OT Goals  Patient Stated Goal To reduce pain and get better at his own pace  OT Goal Formulation With patient  Time For Goal Achievement 11/12/23  Potential to Achieve Goals Fair  OT Time Calculation  OT Start Time (ACUTE ONLY) 1046  OT Stop Time (ACUTE ONLY) 1100  OT Time Calculation (min) 14 min  OT General Charges  $OT Visit 1 Visit   OT met with pt to update goals and discuss his current level of functioning, as well as goals pt deems as  feasible and meaning for him. Prior to session with pt, discussed goals with pt's providing OTA and the levels of progress pt has been making, including current interventions and barriers to function.   10/29/2023  AB, OTR/L  Acute Rehabilitation Services  Office: [phone]

## 2023-10-29 NOTE — Progress Notes (Signed)
 Patient's discharge is being delayed due to need for confirmation of referral made to [facility]. Called and left message with referral department this morning requesting a call back.   Called again and spoke to Welda. She was able to locate some medical information but not the full referral. She is asking for it to be resent. Medical records sent to [phone] (number confirmed) and a secondary [phone].  Devere confirmed that she has everything she needs. She will present to the MD. He will then tell her when the appointment can be scheduled - about 1-2 weeks out. They will call patient directly. If you need any further info you can call Devere directly at [phone]. She will be there today and tomorrow. Other line is [phone] ext [phone].   Above message given to inpatient team.   Oncology Nurse Navigator Documentation     10/29/2023    1:30 PM  Oncology Nurse Navigator Flowsheets  Navigator Location [facility]  Navigator Encounter Type Telephone  Telephone Incoming Call  Time Spent with Patient 15

## 2023-10-29 NOTE — Progress Notes (Signed)
 TRH   ROUNDING   NOTE Frank [name]. 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 14 days and this was apparently stopped Depression  He was admitted from [facility] 4/13 through 4/16 with sepsis from community-acquired pneumonia symptomatic anemia and lytic bone lesions concerning for myeloma-labs there showed a hemoglobin of 6 received 3 units creatinine improved from 5.8-5.3 IgG was 1304 28 IgA 85 IgM less than 8  [name] saw the patient  He stayed in the ICU for 36 days underwent trach also HD/CRRT PEG tube was placed 5/29 IR drain placed right hip for infected hematoma 7/11 He was also found on 7/17 to have bilateral lower extremity DVTs Because of close fracture left distal humerus pathological fracture he ultimately underwent amputation of left arm through humerus on 7/28  He awaits eventual disposition to skilled facility  Plan  Multiple myeloma Port-A-Cath placed 7/30 and started on Velcade  Cytoxan  6/5-Dr. --he received cycle 2 of carfilzomid/dexamethasone /Isatuximab  on 8/12 He remains on prophylactic acyclovir  400 twice daily  Erythropoietin  etc. 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 as per [name] Solo on doxycycline  started on 8/11 for this indication--- will assess wound in the next several days Oxy IR increased to 10-15 every 4 as needed, add gabapentin  200 at bedtime and see if  pain is better, can use Dilaudid  0.5-1 every 4 as needed severe pain  Infected gluteal hematoma-status post drain placement 7/11 with exchange 7/25 IR to address and possibly remove drain prior to discharge, will place consult for them to see-drainage seems to be about 900 cc so unsure if can come out before patient leaves  AKI requiring CRRT HD initially Kidney function has resolved closer to baseline levels Trend as able  Paroxysmal A-fib Continues on verapamil  120 CR daily, is on Eliquis  5 twice daily  DMT by 2 Moderately controlled sugars ranging 100s to 150s Continues on Lantus  13 every 24 2 units 3 times a day and at bedtime and sliding scale coverage moderate Adjust as able may add gabapentin  in next several days  OSA Dysphagia Aspergillosis with pneumonia bilateral DVTs Completed treatment with voriconazole  apparently previously  Depression Continue hydroxyzine  10 3 times daily as needed anxiety, Paxil  40 daily, Depakote  750 twice daily  Data Reviewed:   No labs recently   DVT prophylaxis: SCD, Eliquis   Status is: Inpatient Remains inpatient appropriate because:   Awaiting skilled once drainage decreases-accepted by [facility] health oncology     Subjective: Awake coherent States pain was 7 out of 10 earlier needed Dilaudid  shot-is amenable to taking p.o. meds and okay with increasing oral meds first He is not having any other real symptoms of fever chills etc.    Objective + exam Vitals:   10/28/23 2029 10/29/23 0416 10/29/23 1137 10/29/23 1519  BP: 104/68 119/81 113/86 114/71  Pulse: 98 82 89 90  Resp: 18 18 18 18   Temp: 98.2 F (36.8 C) 98.5 F (36.9 C) 97.8  F (36.6 C) 98.4 F (36.9 C)  TempSrc: Oral Oral Oral Oral  SpO2: 96% 96% 96% 94%  Weight:      Height:       Filed Weights   10/16/23 0412 10/18/23 0548 10/23/23 0422  Weight: 107 kg 102 kg 115 kg     Examination: Coherent, no distress Thick neck Mallampati 4 S1-S2 no murmur Abdomen  soft no rebound no guarding ROM intact No lower extremity edema Trends humeral amputation noted on left side did not examine underneath the wound     Scheduled Meds:  (feeding supplement) PROSource Plus  30 mL Oral TID BM   acyclovir   400 mg Oral BID   apixaban   5 mg Oral BID   collagenase    Topical Daily   docusate sodium   100 mg Oral BID   doxycycline   100 mg Oral Q12H   epoetin  alfa  40,000 Units Subcutaneous Q Wed-1800   feeding supplement  237 mL Oral BID WC   guaiFENesin   600 mg Oral BID   heparin  lock flush  500 Units Intravenous Once   hydrALAZINE   25 mg Oral Q8H   insulin  aspart  0-15 Units Subcutaneous TID AC & HS   insulin  aspart  2 Units Subcutaneous TID AC & HS   insulin  glargine-yfgn  13 Units Subcutaneous Q24H   leptospermum manuka honey  1 Application Topical Daily   lidocaine   1 patch Transdermal Q24H   montelukast   5 mg Oral QHS   multivitamin with minerals  1 tablet Oral Daily   nutrition supplement (JUVEN)  1 packet Oral BID AC   mouth rinse  15 mL Mouth Rinse 4 times per day   pantoprazole   40 mg Oral BID   PARoxetine   40 mg Oral Daily   senna  1 tablet Oral Daily   umeclidinium-vilanterol  1 puff Inhalation Daily   valproic  acid  750 mg Oral BID   verapamil   120 mg Oral Daily   Continuous Infusions:  sodium chloride  Stopped (10/13/23 0952)   sodium chloride  Stopped (08/28/23 9160)   sodium chloride       Time 44  Jai-Gurmukh Cheynne Virden, MD  [facility]

## 2023-10-29 NOTE — Progress Notes (Signed)
 OT Cancellation Note  Patient Details Name: Frank [name]. MRN: [id number] DOB: 1965/12/04   Cancelled Treatment:    Reason Eval/Treat Not Completed: Other (comment) (Patient stating he was not feeling well from chemo yesterday and asked to do OT/PT tomorrow)  [name] 10/29/2023, 1:17 PM  [name], OTA Acute Rehabilitation Services  Office [phone]

## 2023-10-30 ENCOUNTER — Inpatient Hospital Stay (HOSPITAL_COMMUNITY)

## 2023-10-30 DIAGNOSIS — A419 Sepsis, unspecified organism: Secondary | ICD-10-CM | POA: Diagnosis not present

## 2023-10-30 DIAGNOSIS — R6521 Severe sepsis with septic shock: Secondary | ICD-10-CM | POA: Diagnosis not present

## 2023-10-30 LAB — CBC
HCT: 29.7 % — ABNORMAL LOW (ref 39.0–52.0)
Hemoglobin: 9.3 g/dL — ABNORMAL LOW (ref 13.0–17.0)
MCH: 30.4 pg (ref 26.0–34.0)
MCHC: 31.3 g/dL (ref 30.0–36.0)
MCV: 97.1 fL (ref 80.0–100.0)
Platelets: 323 K/uL (ref 150–400)
RBC: 3.06 MIL/uL — ABNORMAL LOW (ref 4.22–5.81)
RDW: 18.7 % — ABNORMAL HIGH (ref 11.5–15.5)
WBC: 4 K/uL (ref 4.0–10.5)
nRBC: 0 % (ref 0.0–0.2)

## 2023-10-30 LAB — FERRITIN: Ferritin: 60 ng/mL (ref 24–336)

## 2023-10-30 LAB — COMPREHENSIVE METABOLIC PANEL WITH GFR
ALT: 7 U/L (ref 0–44)
AST: 8 U/L — ABNORMAL LOW (ref 15–41)
Albumin: 2.1 g/dL — ABNORMAL LOW (ref 3.5–5.0)
Alkaline Phosphatase: 66 U/L (ref 38–126)
Anion gap: 6 (ref 5–15)
BUN: 47 mg/dL — ABNORMAL HIGH (ref 6–20)
CO2: 24 mmol/L (ref 22–32)
Calcium: 8.1 mg/dL — ABNORMAL LOW (ref 8.9–10.3)
Chloride: 103 mmol/L (ref 98–111)
Creatinine, Ser: 1.3 mg/dL — ABNORMAL HIGH (ref 0.61–1.24)
GFR, Estimated: >60 mL/min (ref >60)
Glucose, Bld: 101 mg/dL — ABNORMAL HIGH (ref 70–99)
Potassium: 4.3 mmol/L (ref 3.5–5.1)
Sodium: 133 mmol/L — ABNORMAL LOW (ref 135–145)
Total Bilirubin: 0.4 mg/dL (ref 0.0–1.2)
Total Protein: 5.2 g/dL — ABNORMAL LOW (ref 6.5–8.1)

## 2023-10-30 LAB — FOLATE: Folate: 24.5 ng/mL

## 2023-10-30 LAB — RETICULOCYTES
Immature Retic Fract: 11.2 % (ref 2.3–15.9)
RBC.: 3 MIL/uL — ABNORMAL LOW (ref 4.22–5.81)
Retic Count, Absolute: 47 K/uL (ref 19.0–186.0)
Retic Ct Pct: 1.6 % (ref 0.4–3.1)

## 2023-10-30 LAB — GLUCOSE, CAPILLARY
Glucose-Capillary: 92 mg/dL (ref 70–99)
Glucose-Capillary: 172 mg/dL — ABNORMAL HIGH (ref 70–99)
Glucose-Capillary: 156 mg/dL — ABNORMAL HIGH (ref 70–99)
Glucose-Capillary: 271 mg/dL — ABNORMAL HIGH (ref 70–99)
Glucose-Capillary: 111 mg/dL — ABNORMAL HIGH (ref 70–99)

## 2023-10-30 LAB — IRON AND TIBC
Iron: 15 ug/dL — ABNORMAL LOW (ref 45–182)
Saturation Ratios: 5 % — ABNORMAL LOW (ref 17.9–39.5)
TIBC: 326 ug/dL (ref 250–450)
UIBC: 311 ug/dL

## 2023-10-30 LAB — VITAMIN B12: Vitamin B-12: 425 pg/mL (ref 180–914)

## 2023-10-30 MED ORDER — DOXYCYCLINE HYCLATE 100 MG PO TABS
100.0000 mg | ORAL_TABLET | Freq: Two times a day (BID) | ORAL | Status: DC
  Administered 2023-10-30 – 2023-11-01 (×4): 100 mg via ORAL
  Filled 2023-10-30 (×4): qty 1

## 2023-10-30 MED ORDER — IOHEXOL 350 MG/ML SOLN
100.0000 mL | Freq: Once | INTRAVENOUS | Status: AC | PRN
  Administered 2023-10-30: 100 mL via INTRAVENOUS

## 2023-10-30 MED ORDER — SODIUM CHLORIDE 0.9 % IV SOLN
250.0000 mg | Freq: Every day | INTRAVENOUS | Status: AC
  Administered 2023-10-30 – 2023-11-02 (×4): 250 mg via INTRAVENOUS
  Filled 2023-10-30: qty 20
  Filled 2023-10-30: qty 15
  Filled 2023-10-30 (×2): qty 20

## 2023-10-30 MED ORDER — IOHEXOL 9 MG/ML PO SOLN
500.0000 mL | ORAL | Status: AC
  Administered 2023-10-30 (×2): 500 mL via ORAL

## 2023-10-30 MED ORDER — OXYCODONE HCL 5 MG PO TABS
10.0000 mg | ORAL_TABLET | ORAL | Status: DC | PRN
  Administered 2023-10-30 – 2023-11-01 (×10): 15 mg via ORAL
  Administered 2023-11-01: 10 mg via ORAL
  Administered 2023-11-02 (×2): 15 mg via ORAL
  Administered 2023-11-02: 10 mg via ORAL
  Administered 2023-11-02 – 2023-11-03 (×2): 15 mg via ORAL
  Administered 2023-11-03: 10 mg via ORAL
  Administered 2023-11-03 – 2023-11-05 (×8): 15 mg via ORAL
  Filled 2023-10-30 (×3): qty 3
  Filled 2023-10-30: qty 2
  Filled 2023-10-30 (×14): qty 3
  Filled 2023-10-30: qty 2
  Filled 2023-10-30 (×6): qty 3
  Filled 2023-10-30: qty 2
  Filled 2023-10-30: qty 3

## 2023-10-30 NOTE — Progress Notes (Signed)
 Physical Therapy Treatment Patient Details Name: Frank [name]. MRN: [id number] DOB: [birth year] Today's Date: 10/30/2023   History of Present Illness 58 y.o. male transferred from [place] [facility] 07/03/23 to [facility] for management of newly diagnosed plasma cell myeloma with weakness and AMS. Pt also with AKI and metabolic encephalopathy. 5/10 transfer to [facility] for iHD. 4/22 Intubated and chemo initiated. 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 5/6 IVIG started. 5/8-6/11 trach. 5/12 iHD initiated. 5/29 PEG placed. 6/5 chemo initiated. 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 Pt request to use BSC prior to sitting up in the recliner. PT/OT and patient would like to perform a face to face stand pivot transfer, pt able to come to standing with maxAx2 requiring 2 attempts, pt with pain and instability in R knee require heavy maxAx2 to pivot to the [facility]. Given pt weakness today, utilized the Stedy to perform transfer from [facility] to recliner. Pt continues to need maxAx2 for power up into standing as pain and fatigue are increasing. Pt agreeable to sit up in recliner to increase sitting tolerance to be ready for outpatient chemotherapy. D/c plan remains appropriate. STAR team will continue to follow.     If plan is discharge home, recommend the following: Two people to help with walking and/or transfers;Assistance with cooking/housework;Assist for transportation;Two people to help with bathing/dressing/bathroom;Direct supervision/assist for medications management;Direct supervision/assist for financial management;Help with stairs or ramp for entrance;Assistance with feeding   Can travel by private vehicle     No  Equipment Recommendations  Hospital bed;Hoyer  lift;Wheelchair (measurements PT);Wheelchair cushion (measurements PT);BSC/3in1       Precautions / Restrictions Precautions Precautions: Fall Recall of Precautions/Restrictions: Impaired Restrictions Weight Bearing Restrictions Per Provider Order: Yes LUE Weight Bearing Per Provider Order: Non weight bearing Other Position/Activity Restrictions: ROM to tolerance     Mobility  Bed Mobility Overal bed mobility: Needs Assistance Bed Mobility: Supine to Sit     Supine to sit: Min assist, HOB elevated     General bed mobility comments: increased time and assistance to scoot towards EOB with HOB raised    Transfers Overall transfer level: Needs assistance Equipment used: Rolling walker (2 wheels), Ambulation equipment used Transfers: Sit to/from Stand, Bed to chair/wheelchair/BSC Sit to Stand: Max assist, +2 physical assistance Stand pivot transfers: Max assist, +2 physical assistance         General transfer comment: Max assist +2 to stand from elevated bed and to pivot to [facility].  Stood with stedy from [facility] with max assist +2. decreased standing tolerance due to BLE pain Transfer via Lift Equipment: Stedy     Balance Overall balance assessment: Needs assistance Sitting-balance support: Feet supported, No upper extremity supported Sitting balance-Leahy Scale: Fair Sitting balance - Comments: EOB   Standing balance support: Single extremity supported Standing balance-Leahy Scale: Poor Standing balance comment: decreased standing tolerance due to BLE pain requiring max assist +2                            Communication Communication Communication: Impaired Factors Affecting Communication: Reduced clarity of speech  Cognition Arousal: Alert Behavior During Therapy: [facility] for tasks assessed/performed  Following commands: Impaired Following commands impaired: Follows one step commands with increased time    Cueing Cueing  Techniques: Verbal cues, Tactile cues, Visual cues     General Comments General comments (skin integrity, edema, etc.): Pt reports feeling better than yesterday but remains tired with pain in his L residual limb as well as his R thigh and hip      Pertinent Vitals/Pain Pain Assessment Pain Assessment: Faces Faces Pain Scale: Hurts whole lot Pain Location: BLEs and LUE amputation Pain Descriptors / Indicators: Aching, Discomfort, Guarding, Moaning Pain Intervention(s): Limited activity within patient's tolerance, Monitored during session, Repositioned     PT Goals (current goals can now be found in the care plan section) Acute Rehab PT Goals PT Goal Formulation: With patient Time For Goal Achievement: 11/06/23 Potential to Achieve Goals: Fair Progress towards PT goals: Progressing toward goals    Frequency    Min 5X/week       Co-evaluation   Reason for Co-Treatment: For patient/therapist safety;To address functional/ADL transfers PT goals addressed during session: Mobility/safety with mobility;Balance OT goals addressed during session: ADL's and self-care;Strengthening/ROM      AM-PAC PT 6 Clicks Mobility   Outcome Measure  Help needed turning from your back to your side while in a flat bed without using bedrails?: A Lot Help needed moving from lying on your back to sitting on the side of a flat bed without using bedrails?: Total Help needed moving to and from a bed to a chair (including a wheelchair)?: Total Help needed standing up from a chair using your arms (e.g., wheelchair or bedside chair)?: Total Help needed to walk in hospital room?: Total Help needed climbing 3-5 steps with a railing? : Total 6 Click Score: 7    End of Session Equipment Utilized During Treatment: Gait belt Activity Tolerance: Patient limited by pain;Patient limited by fatigue Patient left: with call bell/phone within reach;in bed Nurse Communication: Mobility status PT Visit Diagnosis:  Other abnormalities of gait and mobility (R26.89);Muscle weakness (generalized) (M62.81);Difficulty in walking, not elsewhere classified (R26.2)     Time: 9164-9071 PT Time Calculation (min) (ACUTE ONLY): 53 min  Charges:    $Therapeutic Activity: 23-37 mins PT General Charges $$ ACUTE PT VISIT: 1 Visit                     Presly Steinruck B. Fleeta Lapidus PT, DPT Acute Rehabilitation Services Please use secure chat or  Call Office [phone]    [name] [facility] 10/30/2023, 7:16 PM

## 2023-10-30 NOTE — Progress Notes (Signed)
 Mr. Bouffard had a little bit of a tough day yesterday.  I am sure this probably was from the chemotherapy that he had on Tuesday.  He did not want to eat all that much.  There was little bit of nausea but no vomiting.  He just felt tired.  He began to eat better with dinner.  His wife is still recovering from her DVT.  She still is not really able to walk that much.  We are arranging for him to have chemotherapy up in [place].  My Nurse Navigator is working on this.  Again, he is on a very good protocol as his myeloma studies have come down incredibly well.  He is doing well with respect to the left arm amputation.  He is getting the dressing changed today.  He has some sutures taken out yesterday.  His labs show a sodium 133.  Potassium 4.3.  BUN 47 creatinine 1.3.  Calcium  8.1 with an albumin  of 2.1.  White cell count is 4.  Hemoglobin 9.3.  Platelet count 323,000.  He has had no fever.  He has had no bleeding.  He is on anticoagulation for the thromboembolic disease.  Hopefully, the drain in his right hip will be able to come out soon.  Vital signs are stable.  Temperature 98.3.  Pulse 88.  Blood pressure 118/86.  His lungs sound clear bilaterally.  He has good air movement bilaterally.  Cardiac exam regular rate and rhythm.  He has no murmurs.  Abdomen soft.  He is somewhat obese.  He has decent bowel sounds.  There is no guarding or rebound tenderness.  Extremities shows no clubbing, cyanosis or edema.  He has he left upper arm amputation.  Neurological exam is nonfocal.  At this point, we are just awaiting for him to be moved up to a facility in [place].  I am not sure when this will happen.  I really do not see a problem with him being able to get outpatient chemotherapy up there.  The protocol is fairly straightforward.  I know that he is still getting incredible care from everybody up on [facility].   Jeralyn Crease, MD  [place] 12:12

## 2023-10-30 NOTE — Progress Notes (Signed)
 TR[facility]   ROUNDING   NOTE Frank [name] Frank [name]. 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 14 days and this was apparently stopped Depression  He was admitted from [facility] 4/13 through 4/16 with sepsis from community-acquired pneumonia symptomatic anemia and lytic bone lesions concerning for myeloma-labs there showed a hemoglobin of 6 received 3 units creatinine improved from 5.8-5.3 IgG was 1304 28 IgA 85 IgM less than 8   Trasfered to [facility] initially and [name] saw the patient He stayed in the ICU for 36 days underwent trach also HD/CRRT PEG tube was placed 5/29 IR drain placed right hip for infected hematoma 7/11 He was also found on 7/17 to have bilateral lower extremity DVTs Because of close fracture left distal humerus pathological fracture he ultimately underwent amputation of left arm through humerus on 7/28  He awaits eventual disposition to skilled facility-the main barrier to this disposition is that he has a drainage catheter in his right hip which needs to be evaluated by IR  Plan  Multiple myeloma Port-A-Cath placed 7/30 and started on Velcade  Cytoxan  6/5-Dr. --he received cycle 2 of carfilzomid/dexamethasone /Isatuximab  on 8/12 and can receive the rest of the management in [place] if he is able to get there [oncology nurse navigator working on this] He remains on prophylactic acyclovir  400 twice daily  Erythropoietin  etc. 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 as per [name] Solo on doxycycline  started on 8/11 for this indication--- wound looks clean-circumscribe dosing to  days total ending 8/18 Oxy IR increased to 10-15 q3 , gabapentin  200 at bedtime and see if pain is better, can use Dilaudid  0.5-1 every 4 as needed severe pain Of encouraged him to use the Oxy preferentially as I cannot discharge him on IV pain meds  Infected gluteal hematoma-status post drain placement 7/11 with exchange 7/25 IR to address --patient states drainage is about the same as last week it gets half-full and is drained about 2-3 times a week-May need drain study  AKI requiring CRRT HD initially Kidney function has resolved closer to baseline levels Allow oral fluids ad lib.  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 given underlying myeloma  DMT by 2 Moderately controlled CNG= 92-1 72 Continues on Lantus  13 every 24--short acting 2 units 3 times a day and at bedtime and sliding scale coverage moderate Gabapentin  as above  OSA Dysphagia Aspergillosis with pneumonia  Completed treatment with voriconazole  apparently previously  Depression Continue hydroxyzine  10 3 times daily as needed anxiety, Paxil  40 daily, Depakote  750 twice daily  Data Reviewed:   Sodium 133 potassium 4.3 BUN/creatinine 47/1.3 WBC 4 hemoglobin 9 platelet 323  DVT prophylaxis: SCD, Eliquis   Status is: Inpatient Remains inpatient appropriate because:   Awaiting skilled once drainage decreases-accepted by [facility] health oncology  Subjective: Just received Dilaudid  pain is about 6/10-we had a discussion about the same-his wounds look good he looks comfortable See pictures in chart    Objective + exam Vitals:   10/30/23  0618 10/30/23 0649 10/30/23 0700 10/30/23 0745  BP: 118/86 107/81  106/73  Pulse:  92  88  Resp:  19    Temp:  97.8 F (36.6 C)  98 F (36.7 C)  TempSrc:  Oral    SpO2:  93%  97%  Weight:   112 kg   Height:       Filed Weights   10/18/23 0548 10/23/23 0422 10/30/23 0700  Weight: 102 kg 115 kg 112 kg     Examination: Coherent, no distress Thick neck Mallampati 4 S1-S2 no murmur Abdomen soft no rebound no guarding ROM intact No lower extremity edema      Scheduled Meds:  (feeding supplement) PROSource Plus  30 mL Oral TID BM   acyclovir   400 mg Oral BID   apixaban   5 mg Oral BID   collagenase    Topical Daily   docusate sodium   100 mg Oral BID   doxycycline   100 mg Oral Q12H   epoetin  alfa  40,000 Units Subcutaneous Q Wed-1800   feeding supplement  237 mL Oral BID WC   gabapentin   200 mg Oral QHS   guaiFENesin   600 mg Oral BID   heparin  lock flush  500 Units Intravenous Once   hydrALAZINE   25 mg Oral Q8H   insulin  aspart  0-15 Units Subcutaneous TID AC & HS   insulin  aspart  2 Units Subcutaneous TID AC & HS   insulin  glargine-yfgn  13 Units Subcutaneous Q24H   leptospermum manuka honey  1 Application Topical Daily   lidocaine   1 patch Transdermal Q24H   montelukast   5 mg Oral QHS   multivitamin with minerals  1 tablet Oral Daily   nutrition supplement (JUVEN)  1 packet Oral BID AC   mouth rinse  15 mL Mouth Rinse 4 times per day   pantoprazole   40 mg Oral BID   PARoxetine   40 mg Oral Daily   senna  1 tablet Oral Daily   umeclidinium-vilanterol  1 puff Inhalation Daily   valproic  acid  750 mg Oral BID   verapamil   120 mg Oral Daily   Continuous Infusions:  sodium chloride  Stopped (10/13/23 0952)   sodium chloride  Stopped (08/28/23 9160)   sodium chloride       Time 44  Colen Grimes, MD  [facility]

## 2023-10-30 NOTE — TOC Progression Note (Signed)
 Transition of Care (TOC) - Progression Note    Patient Details  Name: Frank [name]. MRN: [id number] Date of Birth: Nov 18, 1965  Transition [facility]) CM/SW Contact  Lauraine FORBES Saa, LCSWA Phone Number: 10/30/2023, 4:37 PM  Clinical Narrative:     4:38 PM CSW reiterated to medical team of need for drain to be removed prior to patient discharging to SNF and need for either CPAP to be ordered or discharge summary saying CPAP not being used and oxygen is as needed. CSW informed medical team of patient's SNF auth expiration of this Saturday, 11/01/2023, at 23:59.  Expected Discharge Plan: Skilled Nursing Facility Barriers to Discharge: Continued Medical Work up, English as a second language teacher               Expected Discharge Plan and Services In-house Referral: Clinical Social Work Discharge Planning Services: [employer] Consult Post Acute Care Choice: Skilled Nursing Facility Living arrangements for the past 2 months: Single Family Home                 DME Arranged: N/A DME Agency: NA       HH Arranged: NA HH Agency: NA         Social Drivers of Health (SDOH) Interventions SDOH Screenings   Food Insecurity: No Food Insecurity (07/05/2023)  Housing: Low Risk  (07/05/2023)  Transportation Needs: No Transportation Needs (07/05/2023)  Utilities: Not At Risk (07/05/2023)  Tobacco Use: High Risk (10/13/2023)    Readmission Risk Interventions     No data to display

## 2023-10-30 NOTE — Plan of Care (Signed)
 Problem: Education: Goal: Knowledge of General Education information will improve Description: Including pain rating scale, medication(s)/side effects and non-pharmacologic comfort measures Outcome: Progressing   Problem: Health Behavior/Discharge Planning: Goal: Ability to manage health-related needs will improve Outcome: Progressing   Problem: Clinical Measurements: Goal: Ability to maintain clinical measurements within normal limits will improve Outcome: Progressing Goal: Will remain free from infection Outcome: Progressing Goal: Diagnostic test results will improve Outcome: Progressing Goal: Respiratory complications will improve Outcome: Progressing Goal: Cardiovascular complication will be avoided Outcome: Progressing   Problem: Activity: Goal: Risk for activity intolerance will decrease Outcome: Progressing   Problem: Nutrition: Goal: Adequate nutrition will be maintained Outcome: Progressing   Problem: Coping: Goal: Level of anxiety will decrease Outcome: Progressing   Problem: Elimination: Goal: Will not experience complications related to bowel motility Outcome: Progressing Goal: Will not experience complications related to urinary retention Outcome: Progressing   Problem: Pain Managment: Goal: General experience of comfort will improve and/or be controlled Outcome: Progressing   Problem: Safety: Goal: Ability to remain free from injury will improve Outcome: Progressing   Problem: Skin Integrity: Goal: Risk for impaired skin integrity will decrease Outcome: Progressing   Problem: Education: Goal: Ability to describe self-care measures that may prevent or decrease complications (Diabetes Survival Skills Education) will improve Outcome: Progressing Goal: Individualized Educational Video(s) Outcome: Progressing   Problem: Coping: Goal: Ability to adjust to condition or change in health will improve Outcome: Progressing   Problem: Fluid  Volume: Goal: Ability to maintain a balanced intake and output will improve Outcome: Progressing   Problem: Health Behavior/Discharge Planning: Goal: Ability to identify and utilize available resources and services will improve Outcome: Progressing Goal: Ability to manage health-related needs will improve Outcome: Progressing   Problem: Metabolic: Goal: Ability to maintain appropriate glucose levels will improve Outcome: Progressing   Problem: Nutritional: Goal: Maintenance of adequate nutrition will improve Outcome: Progressing Goal: Progress toward achieving an optimal weight will improve Outcome: Progressing   Problem: Skin Integrity: Goal: Risk for impaired skin integrity will decrease Outcome: Progressing   Problem: Tissue Perfusion: Goal: Adequacy of tissue perfusion will improve Outcome: Progressing   Problem: Education: Goal: Knowledge about tracheostomy care/management will improve Outcome: Progressing   Problem: Activity: Goal: Ability to tolerate increased activity will improve Outcome: Progressing   Problem: Health Behavior/Discharge Planning: Goal: Ability to manage tracheostomy will improve Outcome: Progressing   Problem: Respiratory: Goal: Patent airway maintenance will improve Outcome: Progressing   Problem: Role Relationship: Goal: Ability to communicate will improve Outcome: Progressing   Problem: Education: Goal: Knowledge of disease and its progression will improve Outcome: Progressing Goal: Individualized Educational Video(s) Outcome: Progressing   Problem: Fluid Volume: Goal: Compliance with measures to maintain balanced fluid volume will improve Outcome: Progressing   Problem: Health Behavior/Discharge Planning: Goal: Ability to manage health-related needs will improve Outcome: Progressing   Problem: Nutritional: Goal: Ability to make healthy dietary choices will improve Outcome: Progressing   Problem: Clinical  Measurements: Goal: Complications related to the disease process, condition or treatment will be avoided or minimized Outcome: Progressing   Problem: Education: Goal: Knowledge of General Education information will improve Description: Including pain rating scale, medication(s)/side effects and non-pharmacologic comfort measures Outcome: Progressing   Problem: Health Behavior/Discharge Planning: Goal: Ability to manage health-related needs will improve Outcome: Progressing   Problem: Clinical Measurements: Goal: Ability to maintain clinical measurements within normal limits will improve Outcome: Progressing Goal: Will remain free from infection Outcome: Progressing Goal: Diagnostic test results will improve Outcome: Progressing Goal: Respiratory  complications will improve Outcome: Progressing Goal: Cardiovascular complication will be avoided Outcome: Progressing   Problem: Activity: Goal: Risk for activity intolerance will decrease Outcome: Progressing   Problem: Nutrition: Goal: Adequate nutrition will be maintained Outcome: Progressing   Problem: Coping: Goal: Level of anxiety will decrease Outcome: Progressing   Problem: Elimination: Goal: Will not experience complications related to bowel motility Outcome: Progressing Goal: Will not experience complications related to urinary retention Outcome: Progressing   Problem: Pain Managment: Goal: General experience of comfort will improve and/or be controlled Outcome: Progressing   Problem: Safety: Goal: Ability to remain free from injury will improve Outcome: Progressing   Problem: Skin Integrity: Goal: Risk for impaired skin integrity will decrease Outcome: Progressing

## 2023-10-30 NOTE — Progress Notes (Addendum)
 Occupational Therapy Treatment Patient Details Name: Frank [name]. MRN: [id number] DOB: 12-29-1965 Today's Date: 10/30/2023   History of present illness 58 y.o. male transferred from [facility] 07/03/23 to [facility] for management of newly diagnosed plasma cell myeloma with weakness and AMS. Pt also with AKI and metabolic encephalopathy. 5/10 transfer to [facility] for iHD. 4/22 Intubated and chemo initiated. 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 5/6 IVIG started. 5/8-6/11 trach. 5/12 iHD initiated. 5/29 PEG placed. 6/5 chemo initiated. 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 Patient stating he felt better today and eager to participate with therapy. Patient demonstrating gains with bed mobility with min assist to get to EOB with HOB elevated. Standing attempted from EOB to RW for transfer to [facility] with patient stating pain on LLE with knee locking and difficulty shifting weight. Patient returned to  EOB and performed second stand and required max assist +2 to pivot to [facility]. Following toilet hygiene Herby was used for transfer to recliner due to BLE pain and required max assist +2 to stand from [facility]. Standing attempted from Calvin with continued limited standing tolerance. Patient assisted to recliner and positioned for comfort. Patient will benefit from continued inpatient follow up therapy, <3 hours/day. Acute OT to continue to follow with STAR program to address established goals.       If plan is discharge home, recommend the following:  Two people to help with walking and/or transfers;Assistance with cooking/housework;Assistance with feeding;Direct supervision/assist for medications management;Direct supervision/assist for financial management;Assist for transportation;Help with stairs or ramp for entrance;A lot of help with  bathing/dressing/bathroom   Equipment Recommendations  Other (comment) (TBD)    Recommendations for Other Services      Precautions / Restrictions Precautions Precautions: Fall Recall of Precautions/Restrictions: Impaired Restrictions Weight Bearing Restrictions Per Provider Order: Yes LUE Weight Bearing Per Provider Order: Non weight bearing Other Position/Activity Restrictions: ROM to tolerance       Mobility Bed Mobility Overal bed mobility: Needs Assistance Bed Mobility: Supine to Sit     Supine to sit: Min assist, HOB elevated     General bed mobility comments: increased time and assistance to scoot towards EOB with HOB raised    Transfers Overall transfer level: Needs assistance Equipment used: Rolling walker (2 wheels), Ambulation equipment used Transfers: Sit to/from Stand, Bed to chair/wheelchair/BSC Sit to Stand: Max assist, +2 physical assistance Stand pivot transfers: Max assist, +2 physical assistance         General transfer comment: Max assist +2 to stand from elevated bed and to pivot to [facility]  Stood with stedy from [facility] with max assist +2. decreased standing tolerance due to BLE pain Transfer via Lift Equipment: Stedy   Balance Overall balance assessment: Needs assistance Sitting-balance support: Feet supported, No upper extremity supported Sitting balance-Leahy Scale: Fair Sitting balance - Comments: EOB   Standing balance support: Single extremity supported Standing balance-Leahy Scale: Poor Standing balance comment: decreased standing tolerance due to BLE pain requiring max assist +2                           ADL either performed or assessed with clinical judgement   ADL Overall ADL's : Needs assistance/impaired     Grooming: Wash/dry hands;Wash/dry face;Set up Grooming Details (indicate cue type and  reason): on EOB         Upper Body Dressing : Minimal assistance;Sitting Upper Body Dressing Details (indicate cue type and  reason): don gown using RUE. Lower Body Dressing: Minimal assistance;Sitting/lateral leans Lower Body Dressing Details (indicate cue type and reason): to donn slip on shoes Toilet Transfer: Maximal assistance;+2 for physical assistance;Stand-pivot;BSC/3in1;Rolling walker (2 wheels) Toilet Transfer Details (indicate cue type and reason): from EOB to [facility] with RW and max assist +2 to pivot. max assist +2 to stand from [facility] with Stedy for transfer to recliner Toileting- Clothing Manipulation and Hygiene: Total assistance;Sitting/lateral lean;Sit to/from stand Toileting - Clothing Manipulation Details (indicate cue type and reason): total assist for toilet hygiene while seated on BSC       General ADL Comments: Patient with complaints of BLE pain during standing and transfers    Extremity/Trunk Assessment              Vision       Perception     Praxis     Communication Communication Communication: Impaired Factors Affecting Communication: Reduced clarity of speech   Cognition Arousal: Alert Behavior During Therapy: WFL for tasks assessed/performed Cognition: Cognition impaired       Memory impairment (select all impairments): Short-term memory   Executive functioning impairment (select all impairments): Problem solving OT - Cognition Comments: difficulty recalling steps for sit to stands                 Following commands: Impaired Following commands impaired: Follows one step commands with increased time      Cueing   Cueing Techniques: Verbal cues, Tactile cues, Visual cues  Exercises      Shoulder Instructions       General Comments Limited to pain this session. Patient with increased difficulty recalling steps for sit to stands    Pertinent Vitals/ Pain       Pain Assessment Pain Assessment: No/denies pain Faces Pain Scale: Hurts whole lot Pain Location: BLEs and LUE amputation Pain Descriptors / Indicators: Aching, Discomfort, Guarding,  Moaning Pain Intervention(s): Limited activity within patient's tolerance, Monitored during session, Repositioned  Home Living                                          Prior Functioning/Environment              Frequency  Min 1X/week (on STAR Program)        Progress Toward Goals  OT Goals(current goals can now be found in the care plan section)  Progress towards OT goals: Progressing toward goals  Acute Rehab OT Goals Patient Stated Goal: to get stronger OT Goal Formulation: With patient Time For Goal Achievement: 11/12/23 Potential to Achieve Goals: Fair ADL Goals Pt Will Perform Grooming: sitting;with set-up (A/E PRN) Pt Will Perform Upper Body Dressing: sitting;with contact guard assist;with adaptive equipment Pt Will Perform Lower Body Dressing: sitting/lateral leans;sit to/from stand;with mod assist (using one-handed RUE techniques) Pt Will Transfer to Toilet: with contact guard assist;bedside commode;with transfer board Pt/caregiver will Perform Home Exercise Program: Increased ROM;Left upper extremity;With written HEP provided;Independently Additional ADL Goal #1: Pt will complete bed mobility with min A as a precursor to EOB ADLs Additional ADL Goal #2: Pt will demonstrate ability to stand statically for >3 mins to demonstrate improving tolerance in prep for OOB mobility Additional ADL Goal #3: Pt will demonstrate independence with  sensory stimulation to LUE to improve phantom symptoms.  Plan      Co-evaluation    PT/OT/SLP Co-Evaluation/Treatment: Yes Reason for Co-Treatment: For patient/therapist safety;To address functional/ADL transfers PT goals addressed during session: Mobility/safety with mobility;Balance OT goals addressed during session: ADL's and self-care;Strengthening/ROM      AM-PAC OT 6 Clicks Daily Activity     Outcome Measure   Help from another person eating meals?: A Little Help from another person taking care of  personal grooming?: A Little Help from another person toileting, which includes using toliet, bedpan, or urinal?: Total Help from another person bathing (including washing, rinsing, drying)?: A Lot Help from another person to put on and taking off regular upper body clothing?: A Little Help from another person to put on and taking off regular lower body clothing?: A Lot 6 Click Score: 14    End of Session Equipment Utilized During Treatment: Gait belt;Rolling walker (2 wheels);Other (comment) Laurent)  OT Visit Diagnosis: Unsteadiness on feet (R26.81);Other abnormalities of gait and mobility (R26.89);Muscle weakness (generalized) (M62.81);Pain Pain - Right/Left: Left Pain - part of body: Leg;Shoulder (and RLE)   Activity Tolerance Patient limited by pain   Patient Left in bed;with call bell/phone within reach   Nurse Communication Mobility status        Time: 9164-9071 OT Time Calculation (min): 53 min  Charges: OT General Charges $OT Visit: 1 Visit OT Treatments $Self Care/Home Management : 23-37 mins  [name], OTA Acute Rehabilitation Services  Office [phone]   [name] 10/30/2023, 12:19 PM

## 2023-10-30 NOTE — Progress Notes (Signed)
 Pt transported down to CT.

## 2023-10-30 NOTE — Plan of Care (Addendum)
 06:00 Dressing changed to LUE per order. Warm soapy water  used to gently cleanse incision area. Gently patted dry. Gauze, kerlix, & ace wrap placed over incision site per order. Pt tolerated dressing change with minimal discomfort. Premedicated for dressing changed with PRN 1mg  dialudid.   Problem: Education: Goal: Knowledge of General Education information will improve Description: Including pain rating scale, medication(s)/side effects and non-pharmacologic comfort measures Outcome: Progressing   Problem: Health Behavior/Discharge Planning: Goal: Ability to manage health-related needs will improve Outcome: Progressing   Problem: Clinical Measurements: Goal: Ability to maintain clinical measurements within normal limits will improve Outcome: Progressing Goal: Will remain free from infection Outcome: Progressing Goal: Diagnostic test results will improve Outcome: Progressing Goal: Respiratory complications will improve Outcome: Progressing Goal: Cardiovascular complication will be avoided Outcome: Progressing   Problem: Activity: Goal: Risk for activity intolerance will decrease Outcome: Progressing   Problem: Nutrition: Goal: Adequate nutrition will be maintained Outcome: Progressing   Problem: Coping: Goal: Level of anxiety will decrease Outcome: Progressing   Problem: Elimination: Goal: Will not experience complications related to bowel motility Outcome: Progressing Goal: Will not experience complications related to urinary retention Outcome: Progressing   Problem: Pain Managment: Goal: General experience of comfort will improve and/or be controlled Outcome: Progressing   Problem: Safety: Goal: Ability to remain free from injury will improve Outcome: Progressing   Problem: Skin Integrity: Goal: Risk for impaired skin integrity will decrease Outcome: Progressing   Problem: Education: Goal: Ability to describe self-care measures that may prevent or decrease  complications (Diabetes Survival Skills Education) will improve Outcome: Progressing Goal: Individualized Educational Video(s) Outcome: Progressing   Problem: Coping: Goal: Ability to adjust to condition or change in health will improve Outcome: Progressing   Problem: Fluid Volume: Goal: Ability to maintain a balanced intake and output will improve Outcome: Progressing   Problem: Health Behavior/Discharge Planning: Goal: Ability to identify and utilize available resources and services will improve Outcome: Progressing Goal: Ability to manage health-related needs will improve Outcome: Progressing   Problem: Metabolic: Goal: Ability to maintain appropriate glucose levels will improve Outcome: Progressing   Problem: Nutritional: Goal: Maintenance of adequate nutrition will improve Outcome: Progressing Goal: Progress toward achieving an optimal weight will improve Outcome: Progressing   Problem: Skin Integrity: Goal: Risk for impaired skin integrity will decrease Outcome: Progressing   Problem: Tissue Perfusion: Goal: Adequacy of tissue perfusion will improve Outcome: Progressing   Problem: Education: Goal: Knowledge about tracheostomy care/management will improve Outcome: Progressing   Problem: Activity: Goal: Ability to tolerate increased activity will improve Outcome: Progressing   Problem: Health Behavior/Discharge Planning: Goal: Ability to manage tracheostomy will improve Outcome: Progressing   Problem: Education: Goal: Knowledge of General Education information will improve Description: Including pain rating scale, medication(s)/side effects and non-pharmacologic comfort measures Outcome: Progressing   Problem: Health Behavior/Discharge Planning: Goal: Ability to manage health-related needs will improve Outcome: Progressing   Problem: Clinical Measurements: Goal: Ability to maintain clinical measurements within normal limits will improve Outcome:  Progressing Goal: Will remain free from infection Outcome: Progressing Goal: Diagnostic test results will improve Outcome: Progressing Goal: Respiratory complications will improve Outcome: Progressing Goal: Cardiovascular complication will be avoided Outcome: Progressing   Problem: Activity: Goal: Risk for activity intolerance will decrease Outcome: Progressing   Problem: Nutrition: Goal: Adequate nutrition will be maintained Outcome: Progressing   Problem: Coping: Goal: Level of anxiety will decrease Outcome: Progressing   Problem: Elimination: Goal: Will not experience complications related to bowel motility Outcome: Progressing Goal: Will not experience complications  related to urinary retention Outcome: Progressing   Problem: Pain Managment: Goal: General experience of comfort will improve and/or be controlled Outcome: Progressing   Problem: Safety: Goal: Ability to remain free from injury will improve Outcome: Progressing   Problem: Skin Integrity: Goal: Risk for impaired skin integrity will decrease Outcome: Progressing

## 2023-10-31 DIAGNOSIS — R6521 Severe sepsis with septic shock: Secondary | ICD-10-CM | POA: Diagnosis not present

## 2023-10-31 DIAGNOSIS — A419 Sepsis, unspecified organism: Secondary | ICD-10-CM | POA: Diagnosis not present

## 2023-10-31 LAB — COMPREHENSIVE METABOLIC PANEL WITH GFR
ALT: 6 U/L (ref 0–44)
AST: 8 U/L — ABNORMAL LOW (ref 15–41)
Albumin: 2.1 g/dL — ABNORMAL LOW (ref 3.5–5.0)
Alkaline Phosphatase: 59 U/L (ref 38–126)
Anion gap: 9 (ref 5–15)
BUN: 49 mg/dL — ABNORMAL HIGH (ref 6–20)
CO2: 22 mmol/L (ref 22–32)
Calcium: 8.2 mg/dL — ABNORMAL LOW (ref 8.9–10.3)
Chloride: 104 mmol/L (ref 98–111)
Creatinine, Ser: 1.21 mg/dL (ref 0.61–1.24)
GFR, Estimated: >60 mL/min (ref >60)
Glucose, Bld: 91 mg/dL (ref 70–99)
Potassium: 4.5 mmol/L (ref 3.5–5.1)
Sodium: 135 mmol/L (ref 135–145)
Total Bilirubin: 0.5 mg/dL (ref 0.0–1.2)
Total Protein: 4.8 g/dL — ABNORMAL LOW (ref 6.5–8.1)

## 2023-10-31 LAB — CBC
HCT: 28.5 % — ABNORMAL LOW (ref 39.0–52.0)
Hemoglobin: 8.9 g/dL — ABNORMAL LOW (ref 13.0–17.0)
MCH: 30.5 pg (ref 26.0–34.0)
MCHC: 31.2 g/dL (ref 30.0–36.0)
MCV: 97.6 fL (ref 80.0–100.0)
Platelets: 329 K/uL (ref 150–400)
RBC: 2.92 MIL/uL — ABNORMAL LOW (ref 4.22–5.81)
RDW: 18.6 % — ABNORMAL HIGH (ref 11.5–15.5)
WBC: 3.4 K/uL — ABNORMAL LOW (ref 4.0–10.5)
nRBC: 0 % (ref 0.0–0.2)

## 2023-10-31 LAB — GLUCOSE, CAPILLARY
Glucose-Capillary: 152 mg/dL — ABNORMAL HIGH (ref 70–99)
Glucose-Capillary: 153 mg/dL — ABNORMAL HIGH (ref 70–99)
Glucose-Capillary: 99 mg/dL (ref 70–99)
Glucose-Capillary: 132 mg/dL — ABNORMAL HIGH (ref 70–99)

## 2023-10-31 NOTE — Progress Notes (Signed)
 Physical Therapy Treatment Patient Details Name: Frank [name]. MRN: [id number] DOB: [birth year] Today's Date: 10/31/2023   History of Present Illness 58 y.o. male transferred from [facility] 07/03/23 to [facility] for management of newly diagnosed plasma cell myeloma with weakness and AMS. Pt also with AKI and metabolic encephalopathy. 5/10 transfer to [name] for iHD. 4/22 Intubated and chemo initiated. 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 5/6 IVIG started. 5/8-6/11 trach. 5/12 iHD initiated. 5/29 PEG placed. 6/5 chemo initiated. 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 Pt has progressed to be able to come to EoB with contact guard assist. Pt is still experiencing increased fatigue from chemotherapy and continues to require maxAx2 for standing and pivoting to [facility]. Pt unable to resume taking steps yet. STAR will continue to follow acutely until d/c to SNF.    If plan is discharge home, recommend the following: Two people to help with walking and/or transfers;Assistance with cooking/housework;Assist for transportation;Two people to help with bathing/dressing/bathroom;Direct supervision/assist for medications management;Direct supervision/assist for financial management;Help with stairs or ramp for entrance;Assistance with feeding   Can travel by private vehicle     No  Equipment Recommendations  Wheelchair (measurements PT);Wheelchair cushion (measurements PT)       Precautions / Restrictions Precautions Precautions: Fall Recall of Precautions/Restrictions: Impaired Restrictions Weight Bearing Restrictions Per Provider Order: Yes LUE Weight Bearing Per Provider Order: Non weight bearing Other Position/Activity Restrictions: ROM to tolerance     Mobility  Bed  Mobility Overal bed mobility: Needs Assistance Bed Mobility: Supine to Sit     Supine to sit: HOB elevated, Contact guard     General bed mobility comments: pt able to utilize bed rail and bring himself to the EoB with only light contact guard assist.    Transfers Overall transfer level: Needs assistance Equipment used: Rolling walker (2 wheels), Ambulation equipment used Transfers: Sit to/from Stand, Bed to chair/wheelchair/BSC Sit to Stand: Max assist, +2 physical assistance Stand pivot transfers: Max assist, +2 physical assistance         General transfer comment: Max assist +2 to stand from elevated bed and to pivot to [facility]. Pt unable to take pivotal steps and required heavy maxAx2 to get to [name] [place]        Balance Overall balance assessment: Needs assistance Sitting-balance support: Feet supported, No upper extremity supported Sitting balance-Leahy Scale: Fair Sitting balance - Comments: EOB   Standing balance support: Single extremity supported Standing balance-Leahy Scale: Poor Standing balance comment: decreased standing tolerance due to BLE pain requiring max assist +2                            Communication Communication Communication: Impaired Factors Affecting Communication: Reduced clarity of speech  Cognition Arousal: Alert Behavior During Therapy: WFL for tasks assessed/performed                             Following commands: Impaired Following commands impaired: Follows one step commands with increased time    Cueing Cueing Techniques: Verbal cues, Tactile cues, Visual cues     General Comments General comments (skin integrity, edema, etc.): Pt continues to feel  poorly but does want to get up to [facility]      Pertinent Vitals/Pain Pain Assessment Pain Assessment: Faces Faces Pain Scale: Hurts even more Pain Location: BLEs and LUE amputation Pain Descriptors / Indicators: Aching, Discomfort, Guarding, Moaning Pain  Intervention(s): Limited activity within patient's tolerance, Monitored during session, Repositioned     PT Goals (current goals can now be found in the care plan section) Acute Rehab PT Goals PT Goal Formulation: With patient Time For Goal Achievement: 11/06/23 Potential to Achieve Goals: Fair Progress towards PT goals: Progressing toward goals    Frequency    Min 5X/week           Co-evaluation PT/OT/SLP Co-Evaluation/Treatment: Yes Reason for Co-Treatment: For patient/therapist safety;To address functional/ADL transfers PT goals addressed during session: Mobility/safety with mobility;Balance OT goals addressed during session: ADL's and self-care;Strengthening/ROM      AM-PAC PT 6 Clicks Mobility   Outcome Measure  Help needed turning from your back to your side while in a flat bed without using bedrails?: A Lot Help needed moving from lying on your back to sitting on the side of a flat bed without using bedrails?: Total Help needed moving to and from a bed to a chair (including a wheelchair)?: Total Help needed standing up from a chair using your arms (e.g., wheelchair or bedside chair)?: Total Help needed to walk in [name] room?: Total Help needed climbing 3-5 steps with a railing? : Total 6 Click Score: 7    End of Session Equipment Utilized During Treatment: Gait belt Activity Tolerance: Patient limited by pain;Patient limited by fatigue Patient left: with call bell/phone within reach;in bed Nurse Communication: Mobility status PT Visit Diagnosis: Other abnormalities of gait and mobility (R26.89);Muscle weakness (generalized) (M62.81);Difficulty in walking, not elsewhere classified (R26.2)     Time: 9094-9052 PT Time Calculation (min) (ACUTE ONLY): 42 min  Charges:    $Therapeutic Activity: 8-22 mins PT General Charges $$ ACUTE PT VISIT: 1 Visit                     Frank [name] B. Frank [name] PT, DPT Acute Rehabilitation Services Please use secure chat  or  Call Office [phone]    Frank [name] Frank [name] 10/31/2023, 11:50 AM

## 2023-10-31 NOTE — Plan of Care (Signed)
  Problem: Education: Goal: Knowledge of General Education information will improve Description: Including pain rating scale, medication(s)/side effects and non-pharmacologic comfort measures Outcome: Progressing   Problem: Health Behavior/Discharge Planning: Goal: Ability to manage health-related needs will improve Outcome: Progressing   Problem: Clinical Measurements: Goal: Ability to maintain clinical measurements within normal limits will improve Outcome: Progressing Goal: Will remain free from infection Outcome: Progressing Goal: Diagnostic test results will improve Outcome: Progressing Goal: Respiratory complications will improve Outcome: Progressing Goal: Cardiovascular complication will be avoided Outcome: Progressing   Problem: Activity: Goal: Risk for activity intolerance will decrease Outcome: Progressing   Problem: Nutrition: Goal: Adequate nutrition will be maintained Outcome: Progressing   Problem: Coping: Goal: Level of anxiety will decrease Outcome: Progressing   Problem: Elimination: Goal: Will not experience complications related to bowel motility Outcome: Progressing Goal: Will not experience complications related to urinary retention Outcome: Progressing   Problem: Pain Managment: Goal: General experience of comfort will improve and/or be controlled Outcome: Progressing   Problem: Safety: Goal: Ability to remain free from injury will improve Outcome: Progressing   Problem: Skin Integrity: Goal: Risk for impaired skin integrity will decrease Outcome: Progressing   Problem: Education: Goal: Ability to describe self-care measures that may prevent or decrease complications (Diabetes Survival Skills Education) will improve Outcome: Progressing Goal: Individualized Educational Video(s) Outcome: Progressing   Problem: Coping: Goal: Ability to adjust to condition or change in health will improve Outcome: Progressing   Problem: Fluid  Volume: Goal: Ability to maintain a balanced intake and output will improve Outcome: Progressing   Problem: Health Behavior/Discharge Planning: Goal: Ability to identify and utilize available resources and services will improve Outcome: Progressing Goal: Ability to manage health-related needs will improve Outcome: Progressing   Problem: Metabolic: Goal: Ability to maintain appropriate glucose levels will improve Outcome: Progressing   Problem: Nutritional: Goal: Maintenance of adequate nutrition will improve Outcome: Progressing Goal: Progress toward achieving an optimal weight will improve Outcome: Progressing   Problem: Skin Integrity: Goal: Risk for impaired skin integrity will decrease Outcome: Progressing   Problem: Tissue Perfusion: Goal: Adequacy of tissue perfusion will improve Outcome: Progressing   Problem: Education: Goal: Knowledge of General Education information will improve Description: Including pain rating scale, medication(s)/side effects and non-pharmacologic comfort measures Outcome: Progressing   Problem: Health Behavior/Discharge Planning: Goal: Ability to manage health-related needs will improve Outcome: Progressing   Problem: Clinical Measurements: Goal: Ability to maintain clinical measurements within normal limits will improve Outcome: Progressing Goal: Will remain free from infection Outcome: Progressing Goal: Diagnostic test results will improve Outcome: Progressing Goal: Respiratory complications will improve Outcome: Progressing Goal: Cardiovascular complication will be avoided Outcome: Progressing   Problem: Activity: Goal: Risk for activity intolerance will decrease Outcome: Progressing   Problem: Nutrition: Goal: Adequate nutrition will be maintained Outcome: Progressing   Problem: Coping: Goal: Level of anxiety will decrease Outcome: Progressing   Problem: Elimination: Goal: Will not experience complications related to  bowel motility Outcome: Progressing Goal: Will not experience complications related to urinary retention Outcome: Progressing   Problem: Pain Managment: Goal: General experience of comfort will improve and/or be controlled Outcome: Progressing   Problem: Safety: Goal: Ability to remain free from injury will improve Outcome: Progressing   Problem: Skin Integrity: Goal: Risk for impaired skin integrity will decrease Outcome: Progressing

## 2023-10-31 NOTE — Progress Notes (Signed)
 Patient placed on overnight pulse oximetry per order.

## 2023-10-31 NOTE — Progress Notes (Signed)
 Frank [name] is doing okay.  He really has had no complaints.  He said in a chair yesterday for a little bit.  He is eating well.  He is having no problems with nausea or vomiting.  He does not have any problems with pain.  He is having no cough or shortness of breath.  He was found to have some iron deficiency anemia.  We are giving him some IV iron.  He got a dose yesterday.  He has had no issues with bleeding.  He is on anticoagulation.  He still has a drain in the right hip.  Hopefully this will come out today.  His labs show sodium 135.  Potassium 4.5.  BUN 49 creatinine 1.21.  Calcium  8.2 with an albumin  of 2.1.  His white cell count is 3.4.  Hemoglobin 8.9.  Platelet count 329,000.  His vital signs show temperature 98.  Pulse 82.  Blood pressure 121/83.  His head and neck exam shows no ocular or oral lesions.  He has no palpable cervical or supraclavicular lymph nodes.  Lungs are clear bilaterally.  Cardiac exam regular rate and rhythm.  He has no murmurs, rubs or bruits.  Abdomen soft.  He has decent bowel sounds.  There is no fluid wave.  There is no guarding or rebound tenderness.  There is no palpable liver or spleen tip.  Extremities shows no clubbing, cyanosis or edema.  He has a healing left upper extremity amputation.  Neurological exam is nonfocal.  At this point, we are just awaiting [name] to be transferred up to a rehab facility closer to home.  I am not sure when this will occur.  He is getting IV iron.  I think his iron saturation was 5%.  He will get a dose of iron today.  He has come an incredibly long way.  He is responded very nicely to therapy for his myeloma.  He obviously has had incredible care from everybody up on [facility].   Jeralyn Crease, MD  Herlene 1:37

## 2023-10-31 NOTE — Progress Notes (Signed)
 Referring Physician(s): Montano,Emil  Supervising Physician: Vanice Revel  Patient Status:  [facility] - In-pt  Chief Complaint: Right hip fluid collection  Subjective: Patient resting comfortably.  Right hip/flank drain in place.   Scant dark brown, thin liquid in bulb.   Allergies: Morphine  and codeine, Shellfish allergy, Betadine  [povidone iodine ], Bupropion, Chlorhexidine , Influenza vaccines, and Metoprolol   Medications: Prior to Admission medications   Medication Sig Start Date End Date Taking? Authorizing Provider  albuterol  (PROVENTIL  HFA;VENTOLIN  HFA) 108 (90 Base) MCG/ACT inhaler Inhale 2 puffs into the lungs every 6 (six) hours as needed for wheezing or shortness of breath.   Yes [provider]  albuterol  (PROVENTIL ) (2.5 MG/3ML) 0.083% nebulizer solution 2.5 mg every 2 (two) hours as needed for wheezing or shortness of breath.   Yes [provider]  aspirin  EC 81 MG tablet Take 81 mg by mouth daily. Swallow whole.   Yes [provider]  busPIRone  (BUSPAR ) 15 MG tablet Take 15 mg by mouth at bedtime.   Yes [provider]  celecoxib  (CELEBREX ) 200 MG capsule Take 200 mg by mouth daily.   Yes [provider]  clonazePAM  (KLONOPIN ) 1 MG tablet Take 1 mg by mouth once as needed for anxiety.   Yes [provider]  cyclobenzaprine  (FLEXERIL ) 5 MG tablet Take 1 tablet (5 mg total) by mouth 3 (three) times daily as needed for muscle spasms. Patient taking differently: Take 5 mg by mouth daily as needed for muscle spasms. 01/30/23  Yes Danton Lauraine LABOR, PA-C  divalproex  (DEPAKOTE ) 500 MG DR tablet Take 500 mg by mouth 2 (two) times daily.   Yes [provider]  EPINEPHrine  0.3 mg/0.3 mL IJ SOAJ injection Inject 0.3 mg into the muscle as needed for anaphylaxis. 08/28/21  Yes [provider]  esomeprazole (NEXIUM) 40 MG capsule Take 40 mg by mouth in the morning.   Yes [provider]   Fluticasone -Salmeterol (ADVAIR) 500-50 MCG/DOSE AEPB Inhale 2 puffs into the lungs in the morning.   Yes [provider]  furosemide  (LASIX ) 40 MG tablet Take 40 mg by mouth daily as needed for fluid or edema.   Yes [provider]  gabapentin  (NEURONTIN ) 300 MG capsule Take 300-600 mg by mouth See admin instructions. 300 mg in the morning, 600 mg at bedtime   Yes [provider]  hydrALAZINE  (APRESOLINE ) 10 MG tablet Take 10 mg by mouth in the morning and at bedtime.   Yes [provider]  hydrochlorothiazide  (HYDRODIURIL ) 25 MG tablet Take 25 mg by mouth daily.   Yes [provider]  lisinopril  (ZESTRIL ) 40 MG tablet Take 40 mg by mouth daily. 03/25/23  Yes [provider]  metFORMIN  (GLUCOPHAGE ) 1000 MG tablet Take 1,000 mg by mouth 2 (two) times daily with a meal.   Yes [provider]  montelukast  (SINGULAIR ) 10 MG tablet Take 10 mg by mouth at bedtime.   Yes [provider]  OVER THE COUNTER MEDICATION Take 400 mg by mouth at bedtime. Burdock root   Yes [provider]  oxyCODONE  (ROXICODONE ) 5 MG immediate release tablet Take 1 tablet (5 mg total) by mouth every 4 (four) hours as needed for severe pain (pain score 7-10). 01/30/23  Yes McClung, Sarah A, PA-C  PARoxetine  (PAXIL ) 40 MG tablet Take 40 mg by mouth every morning.   Yes [provider]  spironolactone  (ALDACTONE ) 25 MG tablet Take 25 mg by mouth at bedtime. 10/07/21  Yes [provider]  tamsulosin  (FLOMAX ) 0.4 MG CAPS capsule Take 0.4 mg by mouth at bedtime. 10/07/21  Yes [provider]  testosterone  cypionate (DEPOTESTOSTERONE CYPIONATE) 200 MG/ML injection Inject 0.5 mLs (100 mg total) into the muscle every 14 (fourteen) days. 09/17/16  Yes Nida, Gebreselassie W, MD  verapamil  (CALAN -SR) 120 MG CR tablet Take 120 mg by mouth daily.   Yes [provider]  Vitamin D , Ergocalciferol , (DRISDOL ) 1.25 MG (50000 UNIT) CAPS  capsule Take 1 capsule (50,000 Units total) by mouth every Thursday. Patient taking differently: Take 50,000 Units by mouth once a week. Tuesday 02/06/23  Yes Danton Lauraine LABOR, PA-C  buPROPion (WELLBUTRIN XL) 150 MG 24 hr tablet Take 150 mg by mouth daily. Patient not taking: Reported on 07/04/2023    [provider]     Vital Signs: BP 105/73   Pulse 78   Temp 98 F (36.7 C)   Resp 18   Ht 6' (1.829 m)   Wt 246 lb 14.6 oz (112 kg)   SpO2 95%   BMI 33.49 kg/m   Physical Exam NAD, alert Abdomen: soft, nontender.  Skin: R lateral/hip drain in place.  Gauze around insertion site is moist with some associated maceration. Minimal erythema around insertion. Ext suture intact.  Thin brown fluid in bulb.  Scant output present in bulb.  Imaging: CT ABDOMEN PELVIS W CONTRAST Result Date: 10/31/2023 CLINICAL DATA:  Follow-up drainage catheter EXAM: CT ABDOMEN AND PELVIS WITH CONTRAST TECHNIQUE: Multidetector CT imaging of the abdomen and pelvis was performed using the standard protocol following bolus administration of intravenous contrast. RADIATION DOSE REDUCTION: This exam was performed according to the departmental dose-optimization program which includes automated exposure control, adjustment of the mA and/or kV according to patient size and/or use of iterative reconstruction technique. CONTRAST:  OMNIPAQUE  IOHEXOL  350 MG/ML SOLN COMPARISON:  10/07/2023 FINDINGS: Lower chest: Patchy bibasilar atelectasis is noted worse on the right than the left. Hepatobiliary: No focal liver abnormality is seen. No gallstones, gallbladder wall thickening, or biliary dilatation. Pancreas: Unremarkable. No pancreatic ductal dilatation or surrounding inflammatory changes. Spleen: Normal in size without focal abnormality. Adrenals/Urinary Tract: Adrenal glands are within normal limits. Kidneys demonstrate scattered cysts bilaterally. No follow-up is recommended. No renal calculi or obstructive changes  are seen. The bladder is partially distended. Stomach/Bowel: Scattered fecal material is noted throughout the colon without obstructive change. The appendix is within normal limits. Small bowel and stomach are within normal limits. Vascular/Lymphatic: Aortic atherosclerosis. No enlarged abdominal or pelvic lymph nodes. Reproductive: Prostate is unremarkable. Other: No abdominal wall hernia or abnormality. No abdominopelvic ascites. Musculoskeletal: Left hip replacement is noted. Degenerative changes of lumbar spine are seen. Chronic T11 compression fracture is noted. In the right buttock a pigtail drainage catheter is identified. There remains a fluid collection which measures approximately 11 by 5 cm in greatest transverse and AP dimensions respectively. The collection in the anterior right thigh is incompletely evaluated on this exam but measures at least 8.7 by 2.6 cm in transverse and AP dimensions respectively. This is slightly smaller seen on the prior study. IMPRESSION: Fluid collections in the anterior right thigh and right buttock. Drainage catheter remains in the right buttock. The overall appearance is relatively stable. The anterior right thigh collection is slightly smaller than that seen on the prior exam. Patchy atelectatic changes in the bases right greater than left. Electronically Signed   By: Oneil Devonshire M.D.   On: 10/31/2023 03:27    Labs:  CBC: Recent Labs  10/26/23 0319 10/27/23 0327 10/30/23 0400 10/31/23 0331  WBC 6.0 5.6 4.0 3.4*  HGB 9.3* 9.4* 9.3* 8.9*  HCT 29.2* 29.6* 29.7* 28.5*  PLT 365 405* 323 329    COAGS: Recent Labs    07/05/23 2227 07/07/23 2030 07/10/23 0411 07/26/23 0423 08/13/23 0908 10/02/23 1456 10/02/23 2305 10/03/23 0719  INR 2.1* 1.9*  --   --  1.0  --   --   --   APTT  --  44*   < > 36  --  43* 39* 52*   < > = values in this interval not displayed.    BMP: Recent Labs    10/26/23 0319 10/27/23 0327 10/30/23 0400 10/31/23 0331   NA 133* 135 133* 135  K 4.5 4.0 4.3 4.5  CL 99 100 103 104  CO2 26 27 24 22   GLUCOSE 120* 123* 101* 91  BUN 43* 42* 47* 49*  CALCIUM  8.4* 8.4* 8.1* 8.2*  CREATININE 1.11 1.12 1.30* 1.21  GFRNONAA >60 >60 >60 >60    LIVER FUNCTION TESTS: Recent Labs    10/25/23 0355 10/26/23 0319 10/30/23 0400 10/31/23 0331  BILITOT <0.2 0.5 0.4 0.5  AST 11* 9* 8* 8*  ALT 7 6 7 6   ALKPHOS 64 66 66 59  PROT 4.9* 5.0* 5.2* 4.8*  ALBUMIN  2.2* 2.2* 2.1* 2.1*    Assessment and Plan: Right flank fluid collection sp drain placement 7/11 for suspected infected hematoma.  Fortunately, fluid aspirate did not show evidence of infection and without growth was determined to be sterile liquefied hematoma.  First attempt at sclerotherapy to close hematoma pocket was performed 7/25 but drain with ongoing output of 45-50 mL per day.  Second attempt for sclerotherapy using doxycycline  with longer dwell time performed 8/7 by Dr. Jenna improvement of output to approximately 20 mL daily with sporadic days of 40-50 mL.  Output has been overall decreased but sporadic over the last week. Spoke with Dr. Jenna who recommended drain removal yesterday. IP team involved in conversation, discussed drain removal vs remaining long term vs surgical consult as there is some concern that fluid will re accumulate after drain removal. Today, team agreed that pulling the drain is the best next step for this patient.  Spoke to patient about potential outcomes after pulling a drain. He agrees that he would like to move forward with drain removal.  R hip drain removed intact (ext suture, drain and internal suture entirely removed), no complications,  patient tolerated procedure well, dressing applied to exit site.  Post-removal instructions: - Okay to shower/sponge bath 24 hours post-removal. - No submerging (swimming, bathing) for 7 days post-removal. - Keep the dressing/bandage on to take shower, take dressing/bandage off and  pat dry  the area completely before placing a new dressing/bandage  - Look for signs and symptoms of infection such as reddening of skin, pus like drainage, fever and/or chills - Orders placed for frequent dressing changes as it is expected that site may continue to leak over the next few days.     Patient verbalized understanding.    Electronically Signed: Laymon Coast, NP 10/31/2023, 2:04 PM   I spent a total of 15 Minutes at the the patient's bedside AND on the patient's hospital floor or unit, greater than 50% of which was counseling/coordinating care for right flank fluid collection.

## 2023-10-31 NOTE — Plan of Care (Signed)
   Problem: Education: Goal: Knowledge of General Education information will improve Description Including pain rating scale, medication(s)/side effects and non-pharmacologic comfort measures Outcome: Progressing

## 2023-10-31 NOTE — Progress Notes (Signed)
 Occupational Therapy Treatment Patient Details Name: Frank [name]. MRN: [id number] DOB: 1965/10/16 Today's Date: 10/31/2023   History of present illness 58 y.o. male transferred from [facility] 07/03/23 to [facility] for management of newly diagnosed plasma cell myeloma with weakness and AMS. Pt also with AKI and metabolic encephalopathy. 5/10 transfer to [facility] for iHD. 4/22 Intubated and chemo initiated. 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 5/6 IVIG started. 5/8-6/11 trach. 5/12 iHD initiated. 5/29 PEG placed. 6/5 chemo initiated. 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 Patient demonstrating gains with bed mobility with CGA to get to EOB. Patient performed bathing and gown changed from EOB and asked to transfer to [facility]. Patient performed standing from EOB to RW with max assist and unable to take steps towards [facility] and performed stand pivot transfer.  Patient required total assist for toilet hygiene and max assist +2 to stand from [facility] into Stedy to transfer back to EOB and return to supine. Patient will benefit from continued inpatient follow up therapy, <3 hours/day.  Acute OT to continue to follow with STAR program to address established goals.       If plan is discharge home, recommend the following:  Two people to help with walking and/or transfers;Assistance with cooking/housework;Assistance with feeding;Direct supervision/assist for medications management;Direct supervision/assist for financial management;Assist for transportation;Help with stairs or ramp for entrance;A lot of help with bathing/dressing/bathroom   Equipment Recommendations  Other (comment) (TBD)    Recommendations for Other Services      Precautions / Restrictions Precautions Precautions: Fall Recall of  Precautions/Restrictions: Impaired Restrictions Weight Bearing Restrictions Per Provider Order: Yes LUE Weight Bearing Per Provider Order: Non weight bearing Other Position/Activity Restrictions: ROM to tolerance       Mobility Bed Mobility Overal bed mobility: Needs Assistance Bed Mobility: Supine to Sit, Sit to Supine     Supine to sit: HOB elevated, Contact guard Sit to supine: Max assist, +2 for physical assistance   General bed mobility comments: pt able to utilize bed rail and bring himself to the EoB with only light contact guard assist. Patient fatigue when returning to supine, requiring max assist +2    Transfers Overall transfer level: Needs assistance Equipment used: Rolling walker (2 wheels), Ambulation equipment used Transfers: Sit to/from Stand, Bed to chair/wheelchair/BSC Sit to Stand: Max assist, +2 physical assistance Stand pivot transfers: Max assist, +2 physical assistance         General transfer comment: Max assist +2 to stand from elevated bed and to pivot to [facility]. Pt unable to take pivotal steps and required heavy maxAx2 to get to [facility]. Stedy used to return to EOB with max assist +2 for sit to stand Transfer via Lift Equipment: Stedy   Balance Overall balance assessment: Needs assistance Sitting-balance support: Feet supported, No upper extremity supported Sitting balance-Leahy Scale: Fair Sitting balance - Comments: EOB   Standing balance support: Single extremity supported Standing balance-Leahy Scale: Poor Standing balance comment: decreased standing tolerance due to BLE pain requiring max assist +2                           ADL either performed or assessed with clinical judgement   ADL Overall ADL's : Needs assistance/impaired  Grooming: Wash/dry hands;Wash/dry face;Set up Grooming Details (indicate cue type and reason): on EOB Upper Body Bathing: Minimal assistance;Sitting Upper Body Bathing Details (indicate cue type and  reason): assistance for back     Upper Body Dressing : Minimal assistance;Sitting Upper Body Dressing Details (indicate cue type and reason): don gown using RUE and education on compensation techniques Lower Body Dressing: Minimal assistance;Sitting/lateral leans Lower Body Dressing Details (indicate cue type and reason): to donn slip on shoes Toilet Transfer: Maximal assistance;+2 for physical assistance;Stand-pivot;BSC/3in1;Rolling walker (2 wheels) Toilet Transfer Details (indicate cue type and reason): from EOB to [facility] with RW and max assist +2 to pivot. max assist +2 to stand from [facility] with Stedy for transfer to EOB Toileting- Clothing Manipulation and Hygiene: Total assistance;Sitting/lateral lean;Sit to/from stand Toileting - Clothing Manipulation Details (indicate cue type and reason): total assist for toilet hygiene while seated on [facility]            Extremity/Trunk Assessment              Vision       Perception     Praxis     Communication Communication Communication: Impaired Factors Affecting Communication: Reduced clarity of speech   Cognition Arousal: Alert Behavior During Therapy: WFL for tasks assessed/performed                                 Following commands: Impaired Following commands impaired: Follows one step commands with increased time      Cueing   Cueing Techniques: Verbal cues, Tactile cues, Visual cues  Exercises      Shoulder Instructions       General Comments Pt continues to feel poorly but does want to get up to [facility]    Pertinent Vitals/ Pain       Pain Assessment Pain Assessment: Faces Faces Pain Scale: Hurts even more Pain Location: BLEs and LUE amputation Pain Descriptors / Indicators: Aching, Discomfort, Guarding, Moaning Pain Intervention(s): Limited activity within patient's tolerance, Monitored during session, Repositioned  Home Living                                          Prior  Functioning/Environment              Frequency  Min 1X/week (on STAR Program)        Progress Toward Goals  OT Goals(current goals can now be found in the care plan section)  Progress towards OT goals: Progressing toward goals  Acute Rehab OT Goals Patient Stated Goal: to increase independence OT Goal Formulation: With patient Time For Goal Achievement: 11/12/23 Potential to Achieve Goals: Fair ADL Goals Pt Will Perform Grooming: sitting;with set-up (A/E PRN) Pt Will Perform Upper Body Dressing: sitting;with contact guard assist;with adaptive equipment Pt Will Perform Lower Body Dressing: sitting/lateral leans;sit to/from stand;with mod assist (using one-handed RUE techniques) Pt Will Transfer to Toilet: with contact guard assist;bedside commode;with transfer board Pt/caregiver will Perform Home Exercise Program: Increased ROM;Left upper extremity;With written HEP provided;Independently Additional ADL Goal #1: Pt will complete bed mobility with min A as a precursor to EOB ADLs Additional ADL Goal #2: Pt will demonstrate ability to stand statically for >3 mins to demonstrate improving tolerance in prep for OOB mobility Additional ADL Goal #3: Pt will demonstrate independence with sensory stimulation to LUE to improve  phantom symptoms.  Plan      Co-evaluation    PT/OT/SLP Co-Evaluation/Treatment: Yes Reason for Co-Treatment: For patient/therapist safety;To address functional/ADL transfers PT goals addressed during session: Mobility/safety with mobility;Balance OT goals addressed during session: ADL's and self-care;Strengthening/ROM      AM-PAC OT 6 Clicks Daily Activity     Outcome Measure   Help from another person eating meals?: A Little Help from another person taking care of personal grooming?: A Little Help from another person toileting, which includes using toliet, bedpan, or urinal?: Total Help from another person bathing (including washing, rinsing,  drying)?: A Lot Help from another person to put on and taking off regular upper body clothing?: A Little Help from another person to put on and taking off regular lower body clothing?: A Lot 6 Click Score: 14    End of Session Equipment Utilized During Treatment: Gait belt;Rolling walker (2 wheels);Other (comment) Laurent)  OT Visit Diagnosis: Unsteadiness on feet (R26.81);Other abnormalities of gait and mobility (R26.89);Muscle weakness (generalized) (M62.81);Pain Pain - Right/Left: Left Pain - part of body: Leg;Shoulder (and RLE)   Activity Tolerance Patient tolerated treatment well   Patient Left in bed;with call bell/phone within reach   Nurse Communication Mobility status        Time: 9079-8991 OT Time Calculation (min): 48 min  Charges: OT General Charges $OT Visit: 1 Visit OT Treatments $Self Care/Home Management : 23-37 mins  [name], OTA Acute Rehabilitation Services  Office [phone]   [name] 10/31/2023, 12:49 PM

## 2023-10-31 NOTE — Progress Notes (Signed)
 TRH   ROUNDING   NOTE Frank [name]. 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 14 days and this was apparently stopped Depression  He was admitted from [facility] 4/13 through 4/16 with sepsis from community-acquired pneumonia symptomatic anemia and lytic bone lesions concerning for myeloma-labs there showed a hemoglobin of 6 received 3 units creatinine improved from 5.8-5.3 IgG was 1304 28 IgA 85 IgM less than 8   Trasfered to [facility] initially and [name] saw the patient He stayed in the ICU for 36 days underwent trach also HD/CRRT 4/17: Admitted to medical floor with oncology consultation to treat multiple myeloma 4/19: Worsening respiratory status/encephalopathy, transferred to ICU -> intubated  4/20: A-fib with RVR. Started heparin . Developed groin and biopsy site hematoma requiring multiple transfusions 4/22: Oncology started Velcade  and Cytoxan   5/2: LP performed  5/8: Trach placed. Some post-op bleeding improved with thrombi-pad 5/10: Voriconazole  added back based on CT Chest findings 5/13: Able to follow some commands. 5/23: Transferred OOU 5/29: PEG placed 6/5: Last dose Kyprolis /Cytoxan , first dose Sarclisa  6/13: TDC removed for line holiday 6/16: New HD catheter placed 6/23: No HD for 1 week, removed HD catheter  6/28: Finished first cycle of chemo, next cycle to start in 2 weeks 7/11 IR drain placed right hip for infected hematoma  7/11 7/17 bilateral lower extremity DVTs 7/28 Because of close fracture left distal humerus pathological fracture he ultimately underwent amputation of left arm through humerus on 7/28 8/7 IR perform sclerotherapy of the infected hematoma-second attempt 8/14 imaging right hip suggestive right buttock pigtail catheter with fluid collection 11 x 5 cm, collection anterior right thigh incompletely evacuated 8 x 6 cm-appears relatively stable  Plan  Multiple myeloma Port-A-Cath placed 7/30 and started on Velcade  Cytoxan  6/5-Dr. --he received cycle 2 of carfilzomid/dexamethasone /Isatuximab  on 8/12 and can receive the rest of the management in [place] if he is able to get there [oncology nurse navigator working on this] He remains on prophylactic acyclovir  400 twice daily  Erythropoietin  etc. 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 Wound VAC removed 8/1 dressing changes etc. as per [name] Solo on doxycycline  started on 8/11 for this indication--- wound looks clean-circumscribe dosing to  days total ending 8/18 Oxy IR now 10-15 q3 , gabapentin  200 at bedtime can use Dilaudid  0.5-1 every 4 as needed severe pain,, continues Flexeril  5 3 times daily, gabapentin  200 at bedtime Overall controlled  Infected gluteal hematoma-status post drain placement 7/11 with exchange 7/25-doxycycline  sclerotherapy performed 7/25 doxycycline  8/15-IR, general surgery concur after 45-minute discussions that it is best to remove the drainage catheter and observe the patient for several days for signs symptoms of infection-I have explained to the patient that I would look at the wound tomorrow with nursing staff and monitor the drainage closely as  it has been putting out less than 9 to 10 cc every several  days If any signs or symptoms of sepsis erythema or change in nature of drainage, general surgery is aware we need formal consult --he is at high risk for a nonhealing wound with surgery and an equally high risk for infection of this area Dr. Lyndel of general surgery agrees to follow the patient in the outpatient setting to keep an eye on this  AKI requiring CRRT HD initially Kidney function has resolved closer to baseline levels Allow oral fluids ad lib. slightly azotemic-force p.o. 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, neuropathy nephropathy Moderately controlled CNG= 99-1 53 Continues on Lantus  13 every 24--short acting 2 units 3 times a day and at bedtime and sliding scale coverage moderate Gabapentin  as above  OHSS habitus with OSA Discussed with RT home settings appear to be 8/5 at 21% FiO2 and patient can probably discharge to skilled facility on the settings we will trial CPAP tonight as he has not used it since his old CPAP machine broke at home  Sacral decubiti Wounds to be seen tomorrow-continue Medihoney paste/collagenase  to sacrum turn as able  Protein energy malnutrition As above-continue therapies  Current resolved/stable issues- sepsis in the setting of staph epi bacteremia--completed antibiotics with Zyvox  on 7/26 Dysphagia resolved, PEG tube removed as above Aspergillosis with pneumonia-completed voriconazole  earlier in hospital stay Bipolar-continues hydroxyzine  10 3 times daily as needed anxiety, Paxil  40, Depakote  750 twice daily    Data Reviewed:   Sodium 135 potassium 4.5 BUN/creatinine 49/1.2 LFTs normal WBC 3.4 hemoglobin 8.9 platelet 329  Iron level was 15 TSAT was 5   DVT prophylaxis: SCD, Eliquis   Status is:  Inpatient Remains inpatient appropriate because:   Awaiting skilled once drainage decreases-accepted by [place] [facility]     Subjective: Looks relatively comfortable No new complaints really Asking about his drainage catheter See above discussion    Objective + exam Vitals:   10/30/23 1926 10/31/23 0501 10/31/23 0809 10/31/23 1647  BP: (!) 115/90 121/83 105/73 102/76  Pulse: 97 82 78 87  Resp: 18 18    Temp: 98.4 F (36.9 C) 98 F (36.7 C) 98 F (36.7 C) 98 F (36.7 C)  TempSrc: Oral Oral    SpO2: 98% 99% 95% 95%  Weight:      Height:       Filed Weights   10/18/23 0548 10/23/23 0422 10/30/23 0700  Weight: 102 kg 115 kg 112 kg     Examination: Coherent awake alert sitting up in bed no distress looks fair S1-S2 no murmur Port-A-Cath in place Left humeral wound not reviewed Abdomen obese nontender no rebound cannot appreciate HSM Did not inspect gluteal fold today    Scheduled Meds:  (feeding supplement) PROSource Plus  30 mL Oral TID BM   acyclovir   400 mg Oral BID   apixaban   5 mg Oral BID   collagenase    Topical Daily   docusate sodium   100 mg Oral BID   doxycycline   100 mg Oral Q12H   epoetin  alfa  40,000 Units Subcutaneous Q Wed-1800   feeding supplement  237 mL Oral BID WC   gabapentin   200 mg Oral QHS   guaiFENesin   600 mg Oral BID   heparin  lock  flush  500 Units Intravenous Once   hydrALAZINE   25 mg Oral Q8H   insulin  aspart  0-15 Units Subcutaneous TID AC & HS   insulin  aspart  2 Units Subcutaneous TID AC & HS   insulin  glargine-yfgn  13 Units Subcutaneous Q24H   leptospermum manuka honey  1 Application Topical Daily   lidocaine   1 patch Transdermal Q24H   montelukast   5 mg Oral QHS   multivitamin with minerals  1 tablet Oral Daily   nutrition supplement (JUVEN)  1 packet Oral BID AC   mouth rinse  15 mL Mouth Rinse 4 times per day   pantoprazole   40 mg Oral BID   PARoxetine   40 mg Oral Daily   senna  1 tablet Oral Daily    umeclidinium-vilanterol  1 puff Inhalation Daily   valproic  acid  750 mg Oral BID   verapamil   120 mg Oral Daily   Continuous Infusions:  sodium chloride  Stopped (10/13/23 0952)   sodium chloride  Stopped (08/28/23 9160)   sodium chloride      ferric gluconate (FERRLECIT) IVPB 250 mg (10/31/23 1022)    Time 85 minutes  Colen Grimes, MD  [facility]

## 2023-10-31 NOTE — TOC Progression Note (Addendum)
 Transition of Care (TOC) - Progression Note    Patient Details  Name: Frank [name]. MRN: [id number] Date of Birth: 09-17-1965  Transition of Care [facility]) CM/SW Contact  Lauraine FORBES Saa, LCSWA Phone Number: 10/31/2023, 2:15 PM  Clinical Narrative:     2:15 PM Per progressions, order was placed for drain removal and patient is expected to discharge with oxygen, not CPAP. Patient is able to discharge to [facility] tomorrow upon drain removal. CSW relayed information to SNF admissions. Patient's SNF insurance authorization is valid until tomorrow at 23:59. CSW will continue to follow and be available to assist.  3:29 PM Drain has been removed. Patient is expected to discharge with BiPap 8/5 21% FiO2 depending on hip drainage August 17th or 18th. CSW relayed information to SNF who requested face mask size for them to place order for Monday delivery, as well as BiPap settings to be placed in discharge summary. SNF also asked for patient to trial Bipap settings and face mask prior to discharge. CSW relayed request to medical team.   Expected Discharge Plan: Skilled Nursing Facility Barriers to Discharge: Continued Medical Work up, English as a second language teacher               Expected Discharge Plan and Services In-house Referral: Clinical Social Work Discharge Planning Services: [employer] Consult Post Acute Care Choice: Skilled Nursing Facility Living arrangements for the past 2 months: Single Family Home                 DME Arranged: N/A DME Agency: NA       HH Arranged: NA HH Agency: NA         Social Drivers of Health (SDOH) Interventions SDOH Screenings   Food Insecurity: No Food Insecurity (07/05/2023)  Housing: Low Risk  (07/05/2023)  Transportation Needs: No Transportation Needs (07/05/2023)  Utilities: Not At Risk (07/05/2023)  Tobacco Use: High Risk (10/13/2023)    Readmission Risk Interventions     No data to display

## 2023-11-01 DIAGNOSIS — A419 Sepsis, unspecified organism: Secondary | ICD-10-CM | POA: Diagnosis not present

## 2023-11-01 DIAGNOSIS — R6521 Severe sepsis with septic shock: Secondary | ICD-10-CM | POA: Diagnosis not present

## 2023-11-01 LAB — COMPREHENSIVE METABOLIC PANEL WITH GFR
ALT: 7 U/L (ref 0–44)
AST: 9 U/L — ABNORMAL LOW (ref 15–41)
Albumin: 2.2 g/dL — ABNORMAL LOW (ref 3.5–5.0)
Alkaline Phosphatase: 58 U/L (ref 38–126)
Anion gap: 5 (ref 5–15)
BUN: 50 mg/dL — ABNORMAL HIGH (ref 6–20)
CO2: 24 mmol/L (ref 22–32)
Calcium: 8.3 mg/dL — ABNORMAL LOW (ref 8.9–10.3)
Chloride: 106 mmol/L (ref 98–111)
Creatinine, Ser: 1.15 mg/dL (ref 0.61–1.24)
GFR, Estimated: >60 mL/min (ref >60)
Glucose, Bld: 77 mg/dL (ref 70–99)
Potassium: 4.5 mmol/L (ref 3.5–5.1)
Sodium: 135 mmol/L (ref 135–145)
Total Bilirubin: 0.5 mg/dL (ref 0.0–1.2)
Total Protein: 4.7 g/dL — ABNORMAL LOW (ref 6.5–8.1)

## 2023-11-01 LAB — GLUCOSE, CAPILLARY
Glucose-Capillary: 163 mg/dL — ABNORMAL HIGH (ref 70–99)
Glucose-Capillary: 109 mg/dL — ABNORMAL HIGH (ref 70–99)
Glucose-Capillary: 133 mg/dL — ABNORMAL HIGH (ref 70–99)

## 2023-11-01 LAB — CBC
HCT: 28.1 % — ABNORMAL LOW (ref 39.0–52.0)
Hemoglobin: 8.9 g/dL — ABNORMAL LOW (ref 13.0–17.0)
MCH: 30.7 pg (ref 26.0–34.0)
MCHC: 31.7 g/dL (ref 30.0–36.0)
MCV: 96.9 fL (ref 80.0–100.0)
Platelets: 320 K/uL (ref 150–400)
RBC: 2.9 MIL/uL — ABNORMAL LOW (ref 4.22–5.81)
RDW: 18.5 % — ABNORMAL HIGH (ref 11.5–15.5)
WBC: 3.6 K/uL — ABNORMAL LOW (ref 4.0–10.5)
nRBC: 0.8 % — ABNORMAL HIGH (ref 0.0–0.2)

## 2023-11-01 MED ORDER — SODIUM CHLORIDE 0.9 % IV SOLN: INTRAVENOUS | Status: AC

## 2023-11-01 NOTE — Progress Notes (Signed)
 TR[facility]   ROUNDING   NOTE Frank [name]. 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 14 days and this was apparently stopped Depression  He was admitted from [facility] 4/13 through 4/16 with sepsis from community-acquired pneumonia symptomatic anemia and lytic bone lesions concerning for myeloma-labs there showed a hemoglobin of 6 received 3 units creatinine improved from 5.8-5.3 IgG was 1304 28 IgA 85 IgM less than 8   Trasfered to [facility] initially and [name] saw the patient He stayed in the ICU for 36 days underwent trach also HD/CRRT 4/17: Admitted to medical floor with oncology consultation to treat multiple myeloma 4/19: Worsening respiratory status/encephalopathy, transferred to ICU -> intubated  4/20: A-fib with RVR. Started heparin . Developed groin and biopsy site hematoma requiring multiple transfusions 4/22: Oncology started Velcade  and Cytoxan   5/2: LP performed  5/8: Trach placed. Some post-op bleeding improved with thrombi-pad 5/10: Voriconazole  added back based on CT Chest findings 5/13: Able to follow some commands. 5/23: Transferred OOU 5/29: PEG placed 6/5: Last dose Kyprolis /Cytoxan , first dose Sarclisa  6/13: TDC removed for line holiday 6/16: New HD catheter placed 6/23: No HD for 1 week, removed HD catheter  6/28: Finished first cycle of chemo, next cycle to start in 2 weeks 7/11 IR drain placed right hip for infected hematoma  7/11 7/17 bilateral lower extremity DVTs 7/28 Because of close fracture left distal humerus pathological fracture he ultimately underwent amputation of left arm through humerus on 7/28 8/7 IR perform sclerotherapy of the infected hematoma-second attempt 8/14 imaging right hip suggestive right buttock pigtail catheter with fluid collection 11 x 5 cm, collection anterior right thigh incompletely evacuated 8 x 6 cm-appears relatively stable  Plan  Multiple myeloma Port-A-Cath placed 7/30 and started on Velcade  Cytoxan  6/5-Dr. --he received cycle 2 of carfilzomid/dexamethasone /Isatuximab  on 8/12 and can receive the rest of the management in [place] if he is able to get there [oncology nurse navigator working on this] on prophylactic acyclovir  400 twice daily  Erythropoietin  etc. 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 Wound VAC removed 8/1 dressing changes etc. as per [name] stop doxycycline  started on 8/11-->8/16 for this indication--- wound clean no ooze  Oxy IR now 10-15 q3 , gabapentin  200 at bedtime can use Dilaudid  0.5-1 every 4 as needed severe pain, continues Flexeril  5 3 times daily, gabapentin  200 at bedtime Overall controlled  Infected gluteal hematoma-status post drain placement 7/11 with exchange 7/25-doxycycline  sclerotherapy performed 7/25 doxycycline  8/15-IR, general surgery concur after 45-minute discussions that it is best to remove the drainage catheter and observe the patient for several days for signs symptoms of infection-we will assess wound in am--not draining much per Nursing Dr. Lyndel of general surgery agrees to follow the patient in the outpatient setting to keep an eye on this  AKI  requiring CRRT HD initially Kidney function has resolved closer to  baseline levels Allow oral fluids ad lib. 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, neuropathy nephropathy Moderately controlled CNG's below 180 Continues on Lantus  13 every 24--short acting 2 units 3 times a day and at bedtime and sliding scale coverage moderate Gabapentin  as above  OHSS habitus with OSA Discussed with RT home settings appear to be 8/5 at 21% FiO2 and patient can probably discharge to skilled facility on the settings we will trial CPAP tonight as he has not used it since his old CPAP machine broke at home  Sacral decubiti Wounds to review when able---continue Medihoney paste/collagenase  to sacrum turn as able  Protein energy malnutrition As above-continue therapies  Current resolved/stable issues- sepsis in the setting of staph epi bacteremia--completed antibiotics with Zyvox  on 7/26 Dysphagia resolved, PEG tube removed as above Aspergillosis with pneumonia-completed voriconazole  earlier in hospital stay Bipolar-continues hydroxyzine  10 3 times daily as needed anxiety, Paxil  40, Depakote  750 twice daily    Data Reviewed:   Na 135, k 4.5 cl 106 Bun/cr--50/1.15 Wbc 3.6, hb 8.9, plt 320  Iron level was 15 TSAT was 5   DVT prophylaxis: SCD, Eliquis   Status is: Inpatient Remains inpatient appropriate because:   Awaiting skilled once drainage decreases-accepted by [place] [facility]     Subjective:  Good spirits, no issues no fever--drainage form R side seems less Upper arm reviewed  Objective + exam Vitals:   10/31/23 2208 11/01/23 0445 11/01/23 0802 11/01/23 1237  BP: 102/68 103/72 103/66 109/72  Pulse: 76 92 92 86  Resp: 16 16 18 18   Temp: 98.2 F (36.8 C) 97.8 F (36.6 C) 98 F (36.7 C) 98.4 F (36.9  C)  TempSrc: Axillary Axillary Oral Axillary  SpO2: 98% 98% 96% 97%  Weight:      Height:       Filed Weights   10/18/23 0548 10/23/23 0422 10/30/23 0700  Weight: 102 kg 115 kg 112 kg     Examination: Coherent awake alert sitting up in bed no distress looks fair S1-S2 no murmur Port-A-Cath in place Left humeral wound as above Abdomen obese nontender no rebound cannot appreciate HSM No LE edema   Scheduled Meds:  (feeding supplement) PROSource Plus  30 mL Oral TID BM   acyclovir   400 mg Oral BID   apixaban   5 mg Oral BID   collagenase    Topical Daily   docusate sodium   100 mg Oral BID   doxycycline   100 mg Oral Q12H   epoetin  alfa  40,000 Units Subcutaneous Q Wed-1800   feeding supplement  237 mL Oral BID WC   gabapentin   200 mg Oral QHS   guaiFENesin   600 mg Oral BID   heparin  lock flush  500 Units Intravenous Once   hydrALAZINE   25 mg Oral Q8H   insulin  aspart  0-15 Units Subcutaneous TID AC & HS   insulin  aspart  2 Units Subcutaneous TID AC & HS   insulin  glargine-yfgn  13 Units Subcutaneous Q24H   leptospermum manuka honey  1 Application Topical Daily   lidocaine   1 patch Transdermal Q24H   montelukast   5 mg Oral QHS   multivitamin with minerals  1 tablet Oral Daily   nutrition  supplement (JUVEN)  1 packet Oral BID AC   mouth rinse  15 mL Mouth Rinse 4 times per day   pantoprazole   40 mg Oral BID   PARoxetine   40 mg Oral Daily   senna  1 tablet Oral Daily   umeclidinium-vilanterol  1 puff Inhalation Daily   valproic  acid  750 mg Oral BID   verapamil   120 mg Oral Daily   Continuous Infusions:  sodium chloride  Stopped (10/13/23 0952)   sodium chloride  Stopped (08/28/23 0839)   sodium chloride      sodium chloride  40 mL/hr at 11/01/23 0851   ferric gluconate (FERRLECIT) IVPB 250 mg (11/01/23 0853)    Time 35 minutes  Jai-Gurmukh Georgene Kopper, MD  [facility]

## 2023-11-01 NOTE — Progress Notes (Signed)
Pt placed on 2L O2 while sleeping  

## 2023-11-01 NOTE — Progress Notes (Signed)
 RT attempted to place patient on CPAP per MD order. Patient stated he was claustrophobic and told the doctor earlier in the shift he would only wear the nasal cannula to sleep. RT explained to patient the importance of wearing the CPAP. Patient agreed to try it, but removed the mask twice even after multiple pressure adjustments. After these attempts, the patient requested to wear the nasal cannula only.

## 2023-11-01 NOTE — Plan of Care (Signed)
 Problem: Education: Goal: Knowledge of General Education information will improve Description: Including pain rating scale, medication(s)/side effects and non-pharmacologic comfort measures Outcome: Progressing   Problem: Health Behavior/Discharge Planning: Goal: Ability to manage health-related needs will improve Outcome: Progressing   Problem: Clinical Measurements: Goal: Ability to maintain clinical measurements within normal limits will improve Outcome: Progressing Goal: Will remain free from infection Outcome: Progressing Goal: Diagnostic test results will improve Outcome: Progressing Goal: Respiratory complications will improve Outcome: Progressing Goal: Cardiovascular complication will be avoided Outcome: Progressing   Problem: Activity: Goal: Risk for activity intolerance will decrease Outcome: Progressing   Problem: Nutrition: Goal: Adequate nutrition will be maintained Outcome: Progressing   Problem: Coping: Goal: Level of anxiety will decrease Outcome: Progressing   Problem: Elimination: Goal: Will not experience complications related to bowel motility Outcome: Progressing Goal: Will not experience complications related to urinary retention Outcome: Progressing   Problem: Pain Managment: Goal: General experience of comfort will improve and/or be controlled Outcome: Progressing   Problem: Safety: Goal: Ability to remain free from injury will improve Outcome: Progressing   Problem: Skin Integrity: Goal: Risk for impaired skin integrity will decrease Outcome: Progressing   Problem: Education: Goal: Ability to describe self-care measures that may prevent or decrease complications (Diabetes Survival Skills Education) will improve Outcome: Progressing Goal: Individualized Educational Video(s) Outcome: Progressing   Problem: Coping: Goal: Ability to adjust to condition or change in health will improve Outcome: Progressing   Problem: Fluid  Volume: Goal: Ability to maintain a balanced intake and output will improve Outcome: Progressing   Problem: Health Behavior/Discharge Planning: Goal: Ability to identify and utilize available resources and services will improve Outcome: Progressing Goal: Ability to manage health-related needs will improve Outcome: Progressing   Problem: Metabolic: Goal: Ability to maintain appropriate glucose levels will improve Outcome: Progressing   Problem: Nutritional: Goal: Maintenance of adequate nutrition will improve Outcome: Progressing Goal: Progress toward achieving an optimal weight will improve Outcome: Progressing   Problem: Skin Integrity: Goal: Risk for impaired skin integrity will decrease Outcome: Progressing   Problem: Tissue Perfusion: Goal: Adequacy of tissue perfusion will improve Outcome: Progressing   Problem: Education: Goal: Knowledge about tracheostomy care/management will improve Outcome: Progressing   Problem: Activity: Goal: Ability to tolerate increased activity will improve Outcome: Progressing   Problem: Health Behavior/Discharge Planning: Goal: Ability to manage tracheostomy will improve Outcome: Progressing   Problem: Education: Goal: Knowledge of General Education information will improve Description: Including pain rating scale, medication(s)/side effects and non-pharmacologic comfort measures Outcome: Progressing   Problem: Health Behavior/Discharge Planning: Goal: Ability to manage health-related needs will improve Outcome: Progressing   Problem: Clinical Measurements: Goal: Ability to maintain clinical measurements within normal limits will improve Outcome: Progressing Goal: Will remain free from infection Outcome: Progressing Goal: Diagnostic test results will improve Outcome: Progressing Goal: Respiratory complications will improve Outcome: Progressing Goal: Cardiovascular complication will be avoided Outcome: Progressing    Problem: Activity: Goal: Risk for activity intolerance will decrease Outcome: Progressing   Problem: Nutrition: Goal: Adequate nutrition will be maintained Outcome: Progressing   Problem: Coping: Goal: Level of anxiety will decrease Outcome: Progressing   Problem: Elimination: Goal: Will not experience complications related to bowel motility Outcome: Progressing Goal: Will not experience complications related to urinary retention Outcome: Progressing   Problem: Pain Managment: Goal: General experience of comfort will improve and/or be controlled Outcome: Progressing   Problem: Safety: Goal: Ability to remain free from injury will improve Outcome: Progressing   Problem: Skin Integrity: Goal: Risk for impaired  skin integrity will decrease Outcome: Progressing

## 2023-11-02 DIAGNOSIS — R6521 Severe sepsis with septic shock: Secondary | ICD-10-CM | POA: Diagnosis not present

## 2023-11-02 DIAGNOSIS — A419 Sepsis, unspecified organism: Secondary | ICD-10-CM | POA: Diagnosis not present

## 2023-11-02 LAB — BASIC METABOLIC PANEL WITH GFR
Anion gap: 8 (ref 5–15)
BUN: 50 mg/dL — ABNORMAL HIGH (ref 6–20)
CO2: 25 mmol/L (ref 22–32)
Calcium: 8.5 mg/dL — ABNORMAL LOW (ref 8.9–10.3)
Chloride: 105 mmol/L (ref 98–111)
Creatinine, Ser: 1.16 mg/dL (ref 0.61–1.24)
GFR, Estimated: >60 mL/min (ref >60)
Glucose, Bld: 101 mg/dL — ABNORMAL HIGH (ref 70–99)
Potassium: 4.6 mmol/L (ref 3.5–5.1)
Sodium: 138 mmol/L (ref 135–145)

## 2023-11-02 LAB — GLUCOSE, CAPILLARY
Glucose-Capillary: 98 mg/dL (ref 70–99)
Glucose-Capillary: 151 mg/dL — ABNORMAL HIGH (ref 70–99)
Glucose-Capillary: 92 mg/dL (ref 70–99)
Glucose-Capillary: 100 mg/dL — ABNORMAL HIGH (ref 70–99)

## 2023-11-02 NOTE — Progress Notes (Signed)
 TRH   ROUNDING   NOTE Frank [name]. 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 14 days and this was apparently stopped Depression  He was admitted from [facility] 4/13 through 4/16 with sepsis from community-acquired pneumonia symptomatic anemia and lytic bone lesions concerning for myeloma-labs there showed a hemoglobin of 6 received 3 units creatinine improved from 5.8-5.3 IgG was 1304 28 IgA 85 IgM less than 8   Trasfered to [facility] initially and [name] saw the patient He stayed in the ICU for 36 days underwent trach also HD/CRRT 4/17: Admitted to medical floor with oncology consultation to treat multiple myeloma 4/19: Worsening respiratory status/encephalopathy, transferred to ICU -> intubated  4/20: A-fib with RVR. Started heparin . Developed groin and biopsy site hematoma requiring multiple transfusions 4/22: Oncology started Velcade  and Cytoxan   5/2: LP performed  5/8: Trach placed. Some post-op bleeding improved with thrombi-pad 5/10: Voriconazole  added back based on CT Chest findings 5/13: Able to follow some commands. 5/23: Transferred OOU 5/29: PEG placed 6/5: Last dose Kyprolis /Cytoxan , first dose Sarclisa  6/13: TDC removed for line holiday 6/16: New HD catheter placed 6/23: No HD for 1 week, removed HD catheter  6/28: Finished first cycle of chemo, next cycle to start in 2 weeks 7/11 IR drain placed right hip for infected hematoma  7/11 7/17 bilateral lower extremity DVTs 7/28 Because of close fracture left distal humerus pathological fracture he ultimately underwent amputation of left arm through humerus on 7/28 8/7 IR perform sclerotherapy of the infected hematoma-second attempt 8/14 imaging right hip suggestive right buttock pigtail catheter with fluid collection 11 x 5 cm, collection anterior right thigh incompletely evacuated 8 x 6 cm-appears relatively stable  Plan  Multiple myeloma Port-A-Cath placed 7/30 and started on Velcade  Cytoxan  6/5-Dr. --he received cycle 2 of carfilzomid/dexamethasone /Isatuximab  on 8/12  Oncologist to consolidate treatment at  [facility] in [place] on prophylactic acyclovir  400 twice daily  Erythropoietin  etc. 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 Wound VAC removed 8/1 dressing changes etc. as per [name] stop doxycycline  started on 8/11-->8/16 for this indication--- wound clean no ooze Oxy IR now 10-15 q3 , gabapentin  200 at bedtime can use Dilaudid  0.5-1 every 4 as needed severe pain, continues Flexeril  5 3 times daily, gabapentin  200 at bedtime Overall controlled  Infected gluteal hematoma-status post drain placement 7/11 with exchange 7/25-doxycycline  sclerotherapy performed 7/25 doxycycline  8/15-IR, general surgery concur after 45-minute discussions that it is best to remove the drainage catheter and observe the patient for several days for signs symptoms of infection Right gluteal/thigh area examined although significant dependent edema no real ooze etc. and looks clean watch for fever 24 hours if stable likely can discharge to skilled facility  AKI requiring CRRT HD initially Kidney function has resolved closer to baseline levels Allow oral  fluids ad  lib. 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 Continues on Lantus  13 every 24--short acting 2 units 3 times a day and at bedtime and sliding scale coverage moderate CBG 92-1 51 Gabapentin  as above  OHSS habitus with OSA Discussed with RT home settings appear to be 8/5 at 21% FiO2  Patient unable to tolerate mask discharging on 2 L oxygen at discharge  Sacral decubiti Sacrum reviewed has stage I and looks clean he needs to turn more often in the bed  Protein energy malnutrition As above-continue therapies  Current resolved/stable issues- sepsis in the setting of staph epi bacteremia--completed antibiotics with Zyvox  on 7/26 Dysphagia resolved, PEG tube removed as above Aspergillosis with pneumonia-completed voriconazole  earlier in hospital stay Bipolar-continues hydroxyzine  10 3 times daily as needed anxiety, Paxil  40, Depakote  750 twice daily   Discussed with wife at bedside-she is aware if everything stabilizes without fevers chills discharge is eminent to rehab facility   Data Reviewed:   Sodium 138 potassium 4.6 BUN/creatinine 50/1.1   DVT prophylaxis: SCD, Eliquis   Status is: Inpatient Remains inpatient appropriate because:   Awaiting skilled once drainage decreases-accepted by [place] [facility] oncology     Subjective:  Good spirits, fair no fever no chills no nausea no vomiting  Objective + exam Vitals:   11/02/23 0031 11/02/23 0357 11/02/23 0838 11/02/23 1533  BP: 105/80 106/74 91/74 106/75  Pulse: 95 93 85 89  Resp: 18  18 18   Temp: 98.4 F (36.9 C) 98.5 F (36.9 C) (!) 97.4  F (36.3 C) 98.3 F (36.8 C)  TempSrc: Oral Oral Oral Oral  SpO2: 94% 99% 92% 97%  Weight:      Height:       Filed Weights   10/18/23 0548 10/23/23 0422 10/30/23 0700  Weight: 102 kg 115 kg 112 kg     Examination: Coherent awake alert sitting up in bed no distress looks fair S1-S2 no murmur Port-A-Cath in place Left humeral wound not reviewed today Abdomen obese nontender no rebound cannot appreciate HSM-I examined the right flank --- he has dependent edema in his upper thighs back as he is mainly on his back-the site of the previous drain for the hematoma looks very clean I was not able to express any pus   Scheduled Meds:  (feeding supplement) PROSource Plus  30 mL Oral TID BM   acyclovir   400 mg Oral BID   apixaban   5 mg Oral BID   collagenase    Topical Daily   docusate sodium   100 mg Oral BID   epoetin  alfa  40,000 Units Subcutaneous Q Wed-1800   feeding supplement  237 mL Oral BID WC   gabapentin   200 mg Oral QHS   guaiFENesin   600 mg Oral BID   heparin  lock flush  500 Units Intravenous Once   hydrALAZINE   25 mg Oral Q8H   insulin  aspart  0-15 Units Subcutaneous TID AC & HS   insulin  aspart  2 Units Subcutaneous TID AC & HS   insulin  glargine-yfgn  13 Units Subcutaneous Q24H   leptospermum manuka honey  1 Application Topical Daily   lidocaine   1 patch Transdermal Q24H  montelukast   5 mg Oral QHS   multivitamin with minerals  1 tablet Oral Daily   nutrition supplement (JUVEN)  1 packet Oral BID AC   mouth rinse  15 mL Mouth Rinse 4 times per day   pantoprazole   40 mg Oral BID   PARoxetine   40 mg Oral Daily   senna  1 tablet Oral Daily   umeclidinium-vilanterol  1 puff Inhalation Daily   valproic  acid  750 mg Oral BID   verapamil   120 mg Oral Daily   Continuous Infusions:  sodium chloride  Stopped (10/13/23 0952)   sodium chloride  Stopped (08/28/23 9160)   sodium chloride       Time 35 minutes  Frank Rydell Wiegel, MD  [facility]

## 2023-11-02 NOTE — Plan of Care (Signed)
 Problem: Education: Goal: Knowledge of General Education information will improve Description: Including pain rating scale, medication(s)/side effects and non-pharmacologic comfort measures Outcome: Progressing   Problem: Health Behavior/Discharge Planning: Goal: Ability to manage health-related needs will improve Outcome: Progressing   Problem: Clinical Measurements: Goal: Ability to maintain clinical measurements within normal limits will improve Outcome: Progressing Goal: Will remain free from infection Outcome: Progressing Goal: Diagnostic test results will improve Outcome: Progressing Goal: Respiratory complications will improve Outcome: Progressing Goal: Cardiovascular complication will be avoided Outcome: Progressing   Problem: Activity: Goal: Risk for activity intolerance will decrease Outcome: Progressing   Problem: Nutrition: Goal: Adequate nutrition will be maintained Outcome: Progressing   Problem: Coping: Goal: Level of anxiety will decrease Outcome: Progressing   Problem: Elimination: Goal: Will not experience complications related to bowel motility Outcome: Progressing Goal: Will not experience complications related to urinary retention Outcome: Progressing   Problem: Pain Managment: Goal: General experience of comfort will improve and/or be controlled Outcome: Progressing   Problem: Safety: Goal: Ability to remain free from injury will improve Outcome: Progressing   Problem: Skin Integrity: Goal: Risk for impaired skin integrity will decrease Outcome: Progressing   Problem: Education: Goal: Ability to describe self-care measures that may prevent or decrease complications (Diabetes Survival Skills Education) will improve Outcome: Progressing Goal: Individualized Educational Video(s) Outcome: Progressing   Problem: Coping: Goal: Ability to adjust to condition or change in health will improve Outcome: Progressing   Problem: Fluid  Volume: Goal: Ability to maintain a balanced intake and output will improve Outcome: Progressing   Problem: Health Behavior/Discharge Planning: Goal: Ability to identify and utilize available resources and services will improve Outcome: Progressing Goal: Ability to manage health-related needs will improve Outcome: Progressing   Problem: Metabolic: Goal: Ability to maintain appropriate glucose levels will improve Outcome: Progressing   Problem: Nutritional: Goal: Maintenance of adequate nutrition will improve Outcome: Progressing Goal: Progress toward achieving an optimal weight will improve Outcome: Progressing   Problem: Skin Integrity: Goal: Risk for impaired skin integrity will decrease Outcome: Progressing   Problem: Tissue Perfusion: Goal: Adequacy of tissue perfusion will improve Outcome: Progressing   Problem: Education: Goal: Knowledge about tracheostomy care/management will improve Outcome: Progressing   Problem: Activity: Goal: Ability to tolerate increased activity will improve Outcome: Progressing   Problem: Health Behavior/Discharge Planning: Goal: Ability to manage tracheostomy will improve Outcome: Progressing   Problem: Education: Goal: Knowledge of General Education information will improve Description: Including pain rating scale, medication(s)/side effects and non-pharmacologic comfort measures Outcome: Progressing   Problem: Health Behavior/Discharge Planning: Goal: Ability to manage health-related needs will improve Outcome: Progressing   Problem: Clinical Measurements: Goal: Ability to maintain clinical measurements within normal limits will improve Outcome: Progressing Goal: Will remain free from infection Outcome: Progressing Goal: Diagnostic test results will improve Outcome: Progressing Goal: Respiratory complications will improve Outcome: Progressing Goal: Cardiovascular complication will be avoided Outcome: Progressing    Problem: Activity: Goal: Risk for activity intolerance will decrease Outcome: Progressing   Problem: Nutrition: Goal: Adequate nutrition will be maintained Outcome: Progressing   Problem: Coping: Goal: Level of anxiety will decrease Outcome: Progressing   Problem: Elimination: Goal: Will not experience complications related to bowel motility Outcome: Progressing Goal: Will not experience complications related to urinary retention Outcome: Progressing   Problem: Pain Managment: Goal: General experience of comfort will improve and/or be controlled Outcome: Progressing   Problem: Safety: Goal: Ability to remain free from injury will improve Outcome: Progressing   Problem: Skin Integrity: Goal: Risk for impaired  skin integrity will decrease Outcome: Progressing

## 2023-11-03 DIAGNOSIS — R6521 Severe sepsis with septic shock: Secondary | ICD-10-CM | POA: Diagnosis not present

## 2023-11-03 DIAGNOSIS — A419 Sepsis, unspecified organism: Secondary | ICD-10-CM | POA: Diagnosis not present

## 2023-11-03 LAB — GLUCOSE, CAPILLARY
Glucose-Capillary: 157 mg/dL — ABNORMAL HIGH (ref 70–99)
Glucose-Capillary: 88 mg/dL (ref 70–99)
Glucose-Capillary: 101 mg/dL — ABNORMAL HIGH (ref 70–99)
Glucose-Capillary: 125 mg/dL — ABNORMAL HIGH (ref 70–99)

## 2023-11-03 LAB — BASIC METABOLIC PANEL WITH GFR
Anion gap: 7 (ref 5–15)
BUN: 51 mg/dL — ABNORMAL HIGH (ref 6–20)
CO2: 26 mmol/L (ref 22–32)
Calcium: 8.5 mg/dL — ABNORMAL LOW (ref 8.9–10.3)
Chloride: 104 mmol/L (ref 98–111)
Creatinine, Ser: 1.37 mg/dL — ABNORMAL HIGH (ref 0.61–1.24)
GFR, Estimated: 59 mL/min — ABNORMAL LOW (ref >60)
Glucose, Bld: 101 mg/dL — ABNORMAL HIGH (ref 70–99)
Potassium: 4.5 mmol/L (ref 3.5–5.1)
Sodium: 137 mmol/L (ref 135–145)

## 2023-11-03 LAB — VALPROIC ACID LEVEL: Valproic Acid Lvl: 19 ug/mL — ABNORMAL LOW (ref 50–100)

## 2023-11-03 NOTE — TOC Progression Note (Addendum)
 Transition of Care (TOC) - Progression Note    Patient Details  Name: Frank [name]. MRN: [id number] Date of Birth: 1965-09-08  Transition of [facility]) CM/SW Contact  Lauraine FORBES Saa, LCSWA Phone Number: 11/03/2023, 4:02 PM  Clinical Narrative:     4:02 PM Patient's significant other, [name], left voicemail for CSW. CSW returned Michelle's call. [name] inquired about patient's expected discharge date. CSW informed [name] that per medical team, patient is able to discharge after receiving chemotherapy tomorrow. CSW informed [name] that a new PT note is needed for new SNF insurance authorization which will need to be approved prior to discharge as first SNF authorization approval has expired. [name] expressed understanding of the information and informed CSW of patient's updated insurance card that patient can provide. [name] requested CSW to follow up on bed availability at [facility]. CSW called [facility] who stated that they would review patient's referral again. CSW informed medical team that insurance authorization will be submitted upon updated PT note. CSW will continue to follow and be available to assist.  4:39 PM Per request, CSW securely sent patient's referral to [facility] SNF.   Expected Discharge Plan: Skilled Nursing Facility Barriers to Discharge: Continued Medical Work up, English as a second language teacher               Expected Discharge Plan and Services In-house Referral: Clinical Social Work Discharge Planning Services: [employer] Consult Post Acute Care Choice: Skilled Nursing Facility Living arrangements for the past 2 months: Single Family Home                 DME Arranged: N/A DME Agency: NA       HH Arranged: NA HH Agency: NA         Social Drivers of Health (SDOH) Interventions SDOH Screenings   Food Insecurity: No Food Insecurity (07/05/2023)  Housing: Low Risk  (07/05/2023)  Transportation Needs: No Transportation  Needs (07/05/2023)  Utilities: Not At Risk (07/05/2023)  Tobacco Use: High Risk (10/13/2023)    Readmission Risk Interventions     No data to display

## 2023-11-03 NOTE — Progress Notes (Signed)
 Frank [name] had a good weekend.  He did sit in a chair for a little bit.  He is eating well.  The cath was taken out of his right thigh.  He has had no fever.  He has had no bleeding.  There has been no diarrhea.  He has had no rashes.  There has been no leg swelling.  He continues on Eliquis .  He has had no cough or shortness of breath.  His labs this morning show a sodium 137.  Potassium 4.5.  BUN 51 creatinine 1.37.  I am still not sure as to when he will be transferred up to a rehab facility in [place] .  His vital signs show temperature 97.7.  Pulse 86.  Blood pressure 122/77.  His head and neck exam shows no ocular or oral lesions.  He has no mucositis.  There is no adenopathy in the neck.  Lungs are clear bilaterally.  Cardiac exam regular rate and rhythm.  Has no murmurs.  Abdomen is soft.  Bowel sounds are present.  There is no fluid wave.  There is no palpable liver or spleen tip.  Extremity shows maybe a little swelling over the right leg.  He has good pulses in his distal extremities.  Skin exam shows no rashes, ecchymosis or petechia.  Neurological exam shows no focal neurological deficit.  The left arm amputation seems to be healing pretty well.  Again, I am not sure when Frank [name] will be moved up to [place] .  He is due for his third week of chemotherapy tomorrow.  If he is in town, we will go ahead and do this.  Will will have to see what his labs look like.  Again, I would like to hope that he will be transferred this week at some point.  I do appreciate the incredible care he is gone for everybody up on 5C.[name]  Jeralyn Crease, MD  Psalm 84:5

## 2023-11-03 NOTE — Progress Notes (Signed)
 Occupational Therapy Treatment Patient Details Name: Frank [name]. MRN: [id number] DOB: 09-18-1965 Today's Date: 11/03/2023   History of present illness 58 y.o. male transferred from [place] [facility] 07/03/23 to [facility] for management of newly diagnosed plasma cell myeloma with weakness and AMS. Pt also with AKI and metabolic encephalopathy. 5/10 transfer to [facility] for iHD. 4/22 Intubated and chemo initiated. 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 5/6 IVIG started. 5/8-6/11 trach. 5/12 iHD initiated. 5/29 PEG placed. 6/5 chemo initiated. 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 PMHx:Lt THA, carpal tunnel syndrome, Lt TKA, Lt humerus fx, PAF, HTN, HLD, bipolar disorder, obesity, T2DM   OT comments  STAR Program OT session: Patient making gains with OT treatment with CGA to get to EOB and increased time. Patient performed grooming and UB bathing/dressing seated on EOB.  Patient performed sit to stand into Stedy with max assist +2 to transfer to [facility]. Patient required increased time to use bathroom this session and total assist for toilet hygiene before returning to EOB and supine. Patient will benefit from continued inpatient follow up therapy, <3 hours/day. Acute OT to continue to follow with STAR program to address established goals.       If plan is discharge home, recommend the following:  Two people to help with walking and/or transfers;Assistance with cooking/housework;Assistance with feeding;Direct supervision/assist for medications management;Direct supervision/assist for financial management;Assist for transportation;Help with stairs or ramp for entrance;A lot of help with bathing/dressing/bathroom   Equipment Recommendations  Other (comment) (TBD)    Recommendations for Other Services      Precautions / Restrictions Precautions Precautions: Fall Recall of Precautions/Restrictions: Impaired Restrictions Weight Bearing Restrictions Per Provider Order: Yes LUE Weight Bearing Per Provider Order: Non weight  bearing Other Position/Activity Restrictions: ROM to tolerance       Mobility Bed Mobility Overal bed mobility: Needs Assistance Bed Mobility: Supine to Sit, Sit to Supine     Supine to sit: HOB elevated, Contact guard Sit to supine: Max assist, +2 for physical assistance   General bed mobility comments: increased time to get to EOB and assistance with BLE and trunk to return to supine    Transfers Overall transfer level: Needs assistance Equipment used: Ambulation equipment used Transfers: Sit to/from Stand, Bed to chair/wheelchair/BSC Sit to Stand: Max assist, +2 physical assistance           General transfer comment: max assist +2 to stand from EOB and BSC into Stedy for transfers Transfer via Lift Equipment: Stedy   Balance Overall balance assessment: Needs assistance Sitting-balance support: Feet supported, No upper extremity supported Sitting balance-Leahy Scale: Fair Sitting balance - Comments: EOB   Standing balance support: Single extremity supported Standing balance-Leahy Scale: Poor Standing balance comment: reliant no Stedy for support                           ADL either performed or assessed with clinical judgement   ADL Overall ADL's : Needs assistance/impaired     Grooming: Wash/dry hands;Wash/dry face;Set up Grooming Details (indicate cue type and reason): on EOB Upper Body Bathing: Minimal assistance;Sitting Upper Body Bathing Details (indicate cue type and reason): assistance for back     Upper Body Dressing : Minimal assistance;Sitting Upper Body Dressing Details (indicate cue type and reason): don gown using RUE and education on compensation techniques Lower Body Dressing: Supervision/safety;Sitting/lateral leans Lower Body Dressing Details (indicate cue type and reason): to donn slip on shoes Toilet Transfer: Maximal assistance;+2 for physical  assistance;BSC/3in1 Statistician Details (indicate cue type and reason): max assist  to stand into Stedy for transfer to/from [facility] from/to EOB           General ADL Comments: increased time spent on [facility]    Extremity/Trunk Assessment              Vision       Perception     Praxis     Communication Communication Communication: Impaired Factors Affecting Communication: Reduced clarity of speech   Cognition Arousal: Alert Behavior During Therapy: WFL for tasks assessed/performed Cognition: Cognition impaired       Memory impairment (select all impairments): Short-term memory   Executive functioning impairment (select all impairments): Problem solving                   Following commands: Impaired Following commands impaired: Follows one step commands with increased time      Cueing   Cueing Techniques: Verbal cues, Tactile cues, Visual cues  Exercises      Shoulder Instructions       General Comments      Pertinent Vitals/ Pain       Pain Assessment Pain Assessment: Faces Faces Pain Scale: Hurts even more Pain Location: BLEs and LUE amputation Pain Descriptors / Indicators: Aching, Discomfort, Guarding, Moaning Pain Intervention(s): Limited activity within patient's tolerance, Monitored during session, Repositioned  Home Living                                          Prior Functioning/Environment              Frequency  Min 1X/week (on STAR Program)        Progress Toward Goals  OT Goals(current goals can now be found in the care plan section)  Progress towards OT goals: Progressing toward goals  Acute Rehab OT Goals Patient Stated Goal: to get back to [facility] OT Goal Formulation: With patient Time For Goal Achievement: 11/12/23 Potential to Achieve Goals: Fair ADL Goals Pt Will Perform Grooming: sitting;with set-up (A/E PRN) Pt Will Perform Upper Body Dressing: sitting;with contact guard assist;with adaptive equipment Pt Will Perform Lower Body Dressing: sitting/lateral leans;sit  to/from stand;with mod assist (using one-handed RUE techniques) Pt Will Transfer to Toilet: with contact guard assist;bedside commode;with transfer board Pt/caregiver will Perform Home Exercise Program: Increased ROM;Left upper extremity;With written HEP provided;Independently Additional ADL Goal #1: Pt will complete bed mobility with min A as a precursor to EOB ADLs Additional ADL Goal #2: Pt will demonstrate ability to stand statically for >3 mins to demonstrate improving tolerance in prep for OOB mobility Additional ADL Goal #3: Pt will demonstrate independence with sensory stimulation to LUE to improve phantom symptoms.  Plan      Co-evaluation                 AM-PAC OT 6 Clicks Daily Activity     Outcome Measure   Help from another person eating meals?: A Little Help from another person taking care of personal grooming?: A Little Help from another person toileting, which includes using toliet, bedpan, or urinal?: Total Help from another person bathing (including washing, rinsing, drying)?: A Lot Help from another person to put on and taking off regular upper body clothing?: A Little Help from another person to put on and taking off regular lower body clothing?: A Lot 6 Click  Score: 14    End of Session Equipment Utilized During Treatment: Gait belt;Other (comment) Laurent)  OT Visit Diagnosis: Unsteadiness on feet (R26.81);Other abnormalities of gait and mobility (R26.89);Muscle weakness (generalized) (M62.81);Pain Pain - Right/Left: Left Pain - part of body: Leg;Shoulder (and RLE)   Activity Tolerance Patient tolerated treatment well   Patient Left in bed;with call bell/phone within reach   Nurse Communication Mobility status        Time: 8981-8869 OT Time Calculation (min): 72 min  Charges: OT General Charges $OT Visit: 1 Visit OT Treatments $Self Care/Home Management : 38-52 mins $Therapeutic Activity: 23-37 mins  [name], OTA Acute Rehabilitation  Services  Office [phone]   [name] 11/03/2023, 2:56 PM

## 2023-11-03 NOTE — Progress Notes (Signed)
 TRH   ROUNDING   NOTE Frank [name]. 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 14 days and this was apparently stopped Depression  He was admitted from [facility] 4/13 through 4/16 with sepsis from community-acquired pneumonia symptomatic anemia and lytic bone lesions concerning for myeloma-labs there showed a hemoglobin of 6 received 3 units creatinine improved from 5.8-5.3 IgG was 1304 28 IgA 85 IgM less than 8   Trasfered to [facility] initially and [name] saw the patient He stayed in the ICU for 36 days underwent trach also HD/CRRT 4/17: Admitted to medical floor with oncology consultation to treat multiple myeloma 4/19: Worsening respiratory status/encephalopathy, transferred to ICU -> intubated  4/20: A-fib with RVR. Started heparin . Developed groin and biopsy site hematoma requiring multiple transfusions 4/22: Oncology started Velcade  and Cytoxan   5/2: LP performed  5/8: Trach placed. Some post-op bleeding improved with thrombi-pad 5/10: Voriconazole  added back based on CT Chest findings 5/13: Able to follow some commands. 5/23: Transferred OOU 5/29: PEG placed 6/5: Last dose Kyprolis /Cytoxan , first dose Sarclisa  6/13: TDC removed for line holiday 6/16: New HD catheter placed 6/23: No HD for 1 week, removed HD catheter  6/28: Finished first cycle of chemo, next cycle to start in 2 weeks 7/11 IR drain placed right hip for infected hematoma  7/11 7/17 bilateral lower extremity DVTs 7/28 Because of close fracture left distal humerus pathological fracture he ultimately underwent amputation of left arm through humerus on 7/28 8/7 IR perform sclerotherapy of the infected hematoma-second attempt 8/14 imaging right hip suggestive right buttock pigtail catheter with fluid collection 11 x 5 cm, collection anterior right thigh incompletely evacuated 8 x 6 cm-appears relatively stable 8/18 nephrology curb sided feel patient can follow in the outpatient no further needs for workup of mild azotemia at this time  Plan  Multiple myeloma Port-A-Cath placed 7/30 and started on Velcade  Cytoxan  6/5-Dr. --he received cycle 2 of carfilzomid/dexamethasone /Isatuximab  on 8/12  Oncologist to consolidate treatment at  [facility] health in [place]-plans in place it appears to give another dose of chemo 8/19 Continues acyclovir  400 twice daily  Erythropoietin  etc. 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 Wound VAC removed 8/1 dressing changes etc. as per [name] stop doxycycline  started on 8/11-->8/16 for this indication--- wound clean no ooze and reviewed by Ortho trauma 8/18 and they agree Oxy IR 10-15 q3 , gabapentin  200 at bedtime can use Dilaudid  0.5-1 every 4 as needed severe pain, continues Flexeril  5 3 times daily, gabapentin  200 at bedtime  Infected gluteal hematoma-status post drain placement 7/11 with exchange 7/25-doxycycline  sclerotherapy performed 7/25 doxycycline  8/15-IR, general surgery concur after 45-minute discussions that it is best to remove the drainage catheter and observe the patient for several days for signs symptoms of infection 8/17 right gluteal/thigh area examined although significant dependent edema  no real ooze etc. and looks clean watch for  fever 24 hours if stable likely can discharge to skilled facility  AKI requiring CRRT HD initially Kidney function -discussed with pharmacy thought to be secondary to valproate toxicity-bladder scan negative-discussed with Dr. 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, neuropathy nephropathy Moderately controlled CNG's below 180 Continues on Lantus  13 every 24--short acting 2 units 3 times a day and at bedtime and sliding scale coverage moderate CBG 92-1 51 Gabapentin  as above  OHSS habitus with OSA Discussed with RT home settings appear to be 8/5 at 21% FiO2  Patient unable to tolerate mask discharging on 2 L oxygen at discharge  Sacral decubiti Sacrum reviewed has stage I and looks clean he needs to turn more often in the bed  Protein energy malnutrition As above-continue therapies BMI 33  Current resolved/stable issues- sepsis in the setting of staph epi bacteremia--completed antibiotics with Zyvox  on 7/26 Dysphagia resolved, PEG tube removed as above Aspergillosis with pneumonia-completed voriconazole  earlier in hospital stay Bipolar-continues hydroxyzine  10 3 times daily as needed anxiety, Paxil  40, Depakote  750 twice daily   Discussed with wife at bedside-on 8/17   Data Reviewed:   Sodium 137 potassium 4.5 BUN/creatinine 51/1.3 Valproic  acid 19  DVT prophylaxis: SCD, Eliquis   Status is: Inpatient Remains inpatient appropriate because:   Awaiting skilled once drainage decreases-accepted by [place] [facility] oncology     Subjective:  No complaints  Objective + exam Vitals:   11/02/23 1931  11/03/23 0613 11/03/23 0848 11/03/23 1711  BP: 115/83 122/77 103/75 125/85  Pulse: 88 86 86 82  Resp:  14    Temp: 98.6 F (37 C) 97.7 F (36.5 C) 97.7 F (36.5 C) 98.5 F (36.9 C)  TempSrc: Axillary Axillary  Oral  SpO2: 96% 98% 95% 98%  Weight:      Height:       Filed Weights   10/18/23 0548 10/23/23 0422 10/30/23 0700  Weight: 102 kg 115 kg 112 kg     Examination:  Pleasant coherent no distress looks fair feels okay Chest is clear port inside right chest Wounds not examined today Abdomen obese nontender  Scheduled Meds:  (feeding supplement) PROSource Plus  30 mL Oral TID BM   acyclovir   400 mg Oral BID   apixaban   5 mg Oral BID   collagenase    Topical Daily   docusate sodium   100 mg Oral BID   epoetin  alfa  40,000 Units Subcutaneous Q Wed-1800   feeding supplement  237 mL Oral BID WC   gabapentin   200 mg Oral QHS   guaiFENesin   600 mg Oral BID   heparin  lock flush  500 Units Intravenous Once   hydrALAZINE   25 mg Oral Q8H   insulin  aspart  0-15 Units Subcutaneous TID AC & HS   insulin  aspart  2 Units Subcutaneous TID AC & HS   insulin  glargine-yfgn  13 Units Subcutaneous Q24H   leptospermum manuka honey  1 Application Topical Daily   lidocaine   1 patch Transdermal Q24H   montelukast   5 mg Oral QHS   multivitamin with minerals  1 tablet  Oral Daily   nutrition supplement (JUVEN)  1 packet Oral BID AC   mouth rinse  15 mL Mouth Rinse 4 times per day   pantoprazole   40 mg Oral BID   PARoxetine   40 mg Oral Daily   senna  1 tablet Oral Daily   umeclidinium-vilanterol  1 puff Inhalation Daily   valproic  acid  750 mg Oral BID   verapamil   120 mg Oral Daily   Continuous Infusions:  sodium chloride  Stopped (10/13/23 0952)   sodium chloride  Stopped (08/28/23 9160)   sodium chloride       Time 55 minutes  Jai-Gurmukh Danesha Kirchoff, MD  [facility]

## 2023-11-03 NOTE — Progress Notes (Signed)
 Orthopaedic Trauma Progress Note  SUBJECTIVE: Doing okay today.  Pain manageable on current regimen.  Is hopeful to discharge from the hospital over the next few days.  When doxycycline  has been stopped, patient denies any fever or chills.  Has not noticed any significant drainage from his incision.    OBJECTIVE:  Vitals:   11/03/23 0613 11/03/23 0848  BP: 122/77 103/75  Pulse: 86 86  Resp: 14   Temp: 97.7 F (36.5 C) 97.7 F (36.5 C)  SpO2: 98% 95%    Opiates Today (MME): Today's  total administered Morphine  Milligram Equivalents: 120 Opiates Yesterday (MME): Yesterday's total administered Morphine  Milligram Equivalents: 182.5  General: Sitting up in bed.  No acute distress.   Respiratory: No increased work of breathing.  Operative Extremity (LUE): Dressing removed, incision appears stable.  No drainage or purulence noted.  All sutures are removed.  New dry dressing applied. All wound edges well approximated. Nontender over the shoulder.  Soreness over the medial aspect of upper arm and into the axilla.  Nontender over the lateral arm.  LABS:  Results for orders placed or performed during the hospital encounter of 07/03/23 (from the past 24 hours)  Glucose, capillary     Status: None   Collection Time: 11/02/23  5:28 PM  Result Value Ref Range   Glucose-Capillary 92 70 - 99 mg/dL   Comment 1 Notify RN   Glucose, capillary     Status: Abnormal   Collection Time: 11/02/23  9:21 PM  Result Value Ref Range   Glucose-Capillary 100 (H) 70 - 99 mg/dL  Basic metabolic panel with GFR     Status: Abnormal   Collection Time: 11/03/23  3:51 AM  Result Value Ref Range   Sodium 137 135 - 145 mmol/L   Potassium 4.5 3.5 - 5.1 mmol/L   Chloride 104 98 - 111 mmol/L   CO2 26 22 - 32 mmol/L   Glucose, Bld 101 (H) 70 - 99 mg/dL   BUN 51 (H) 6 - 20 mg/dL   Creatinine, Ser 8.62 (H) 0.61 - 1.24 mg/dL   Calcium  8.5 (L) 8.9 - 10.3 mg/dL   GFR, Estimated 59 (L) >60 mL/min   Anion gap 7 5 - 15   Glucose, capillary     Status: Abnormal   Collection Time: 11/03/23  9:59 AM  Result Value Ref Range   Glucose-Capillary 157 (H) 70 - 99 mg/dL  Valproic  acid level     Status: Abnormal   Collection Time: 11/03/23 11:33 AM  Result Value Ref Range   Valproic  Acid Lvl 19 (L) 50 - 100 ug/mL  Glucose, capillary     Status: None   Collection Time: 11/03/23 12:13 PM  Result Value Ref Range   Glucose-Capillary 88 70 - 99 mg/dL    ASSESSMENT: Jeronimo Hellberg. is a 58 y.o. male, 21 Days Post-Op s/p LEFT TRANSHUMERAL AMPUTATION   CV/Blood loss: Hgb 8.9 on 11/01/2023. Stable. Hemodynamically stable  PLAN: Weightbearing: NWB LUE ROM: Unrestricted motion as tolerated Incisional and dressing care: Continue to wash LUE incision daily with soap and water . Change dressing daily with gauze, kerlix, ace wrap. Ok to leave open to air if no drainage Orthopedic device(s): None Pain management: Continue current regimen VTE treatment: Eliquis .  Continue SCDs ID: All ABX completed Foley/Lines:  No foley, KVO IVFs Dispo: Ortho issues stable.  Continue care per primary team and oncology.    Follow - up plan: 2 weeks after d/c for wound check  Contact information:  [name], [name] PA-C. After hours and holidays please check [email or web address] for group call information for [facility]   [name], PA-C [phone] (office) [email or web address]

## 2023-11-03 NOTE — Plan of Care (Signed)
  Problem: Education: Goal: Knowledge of General Education information will improve Description: Including pain rating scale, medication(s)/side effects and non-pharmacologic comfort measures Outcome: Progressing   Problem: Health Behavior/Discharge Planning: Goal: Ability to manage health-related needs will improve Outcome: Progressing   Problem: Clinical Measurements: Goal: Ability to maintain clinical measurements within normal limits will improve Outcome: Progressing Goal: Will remain free from infection Outcome: Progressing Goal: Diagnostic test results will improve Outcome: Progressing Goal: Respiratory complications will improve Outcome: Progressing Goal: Cardiovascular complication will be avoided Outcome: Progressing   Problem: Activity: Goal: Risk for activity intolerance will decrease Outcome: Progressing   Problem: Nutrition: Goal: Adequate nutrition will be maintained Outcome: Progressing   Problem: Coping: Goal: Level of anxiety will decrease Outcome: Progressing   Problem: Elimination: Goal: Will not experience complications related to bowel motility Outcome: Progressing Goal: Will not experience complications related to urinary retention Outcome: Progressing   Problem: Pain Managment: Goal: General experience of comfort will improve and/or be controlled Outcome: Progressing   Problem: Safety: Goal: Ability to remain free from injury will improve Outcome: Progressing   Problem: Skin Integrity: Goal: Risk for impaired skin integrity will decrease Outcome: Progressing   Problem: Education: Goal: Ability to describe self-care measures that may prevent or decrease complications (Diabetes Survival Skills Education) will improve Outcome: Progressing Goal: Individualized Educational Video(s) Outcome: Progressing   Problem: Coping: Goal: Ability to adjust to condition or change in health will improve Outcome: Progressing   Problem: Fluid  Volume: Goal: Ability to maintain a balanced intake and output will improve Outcome: Progressing   Problem: Health Behavior/Discharge Planning: Goal: Ability to identify and utilize available resources and services will improve Outcome: Progressing Goal: Ability to manage health-related needs will improve Outcome: Progressing   Problem: Metabolic: Goal: Ability to maintain appropriate glucose levels will improve Outcome: Progressing   Problem: Nutritional: Goal: Maintenance of adequate nutrition will improve Outcome: Progressing Goal: Progress toward achieving an optimal weight will improve Outcome: Progressing   Problem: Skin Integrity: Goal: Risk for impaired skin integrity will decrease Outcome: Progressing   Problem: Tissue Perfusion: Goal: Adequacy of tissue perfusion will improve Outcome: Progressing   Problem: Education: Goal: Knowledge of General Education information will improve Description: Including pain rating scale, medication(s)/side effects and non-pharmacologic comfort measures Outcome: Progressing   Problem: Health Behavior/Discharge Planning: Goal: Ability to manage health-related needs will improve Outcome: Progressing   Problem: Clinical Measurements: Goal: Ability to maintain clinical measurements within normal limits will improve Outcome: Progressing Goal: Will remain free from infection Outcome: Progressing Goal: Diagnostic test results will improve Outcome: Progressing Goal: Respiratory complications will improve Outcome: Progressing Goal: Cardiovascular complication will be avoided Outcome: Progressing   Problem: Activity: Goal: Risk for activity intolerance will decrease Outcome: Progressing   Problem: Nutrition: Goal: Adequate nutrition will be maintained Outcome: Progressing   Problem: Coping: Goal: Level of anxiety will decrease Outcome: Progressing   Problem: Elimination: Goal: Will not experience complications related to  bowel motility Outcome: Progressing Goal: Will not experience complications related to urinary retention Outcome: Progressing   Problem: Pain Managment: Goal: General experience of comfort will improve and/or be controlled Outcome: Progressing   Problem: Safety: Goal: Ability to remain free from injury will improve Outcome: Progressing   Problem: Skin Integrity: Goal: Risk for impaired skin integrity will decrease Outcome: Progressing

## 2023-11-03 NOTE — Plan of Care (Signed)
 Problem: Education: Goal: Knowledge of General Education information will improve Description: Including pain rating scale, medication(s)/side effects and non-pharmacologic comfort measures Outcome: Progressing   Problem: Health Behavior/Discharge Planning: Goal: Ability to manage health-related needs will improve Outcome: Progressing   Problem: Clinical Measurements: Goal: Ability to maintain clinical measurements within normal limits will improve Outcome: Progressing Goal: Will remain free from infection Outcome: Progressing Goal: Diagnostic test results will improve Outcome: Progressing Goal: Respiratory complications will improve Outcome: Progressing Goal: Cardiovascular complication will be avoided Outcome: Progressing   Problem: Activity: Goal: Risk for activity intolerance will decrease Outcome: Progressing   Problem: Nutrition: Goal: Adequate nutrition will be maintained Outcome: Progressing   Problem: Coping: Goal: Level of anxiety will decrease Outcome: Progressing   Problem: Elimination: Goal: Will not experience complications related to bowel motility Outcome: Progressing Goal: Will not experience complications related to urinary retention Outcome: Progressing   Problem: Pain Managment: Goal: General experience of comfort will improve and/or be controlled Outcome: Progressing   Problem: Safety: Goal: Ability to remain free from injury will improve Outcome: Progressing   Problem: Skin Integrity: Goal: Risk for impaired skin integrity will decrease Outcome: Progressing   Problem: Education: Goal: Ability to describe self-care measures that may prevent or decrease complications (Diabetes Survival Skills Education) will improve Outcome: Progressing Goal: Individualized Educational Video(s) Outcome: Progressing   Problem: Coping: Goal: Ability to adjust to condition or change in health will improve Outcome: Progressing   Problem: Fluid  Volume: Goal: Ability to maintain a balanced intake and output will improve Outcome: Progressing   Problem: Health Behavior/Discharge Planning: Goal: Ability to identify and utilize available resources and services will improve Outcome: Progressing Goal: Ability to manage health-related needs will improve Outcome: Progressing   Problem: Metabolic: Goal: Ability to maintain appropriate glucose levels will improve Outcome: Progressing   Problem: Nutritional: Goal: Maintenance of adequate nutrition will improve Outcome: Progressing Goal: Progress toward achieving an optimal weight will improve Outcome: Progressing   Problem: Skin Integrity: Goal: Risk for impaired skin integrity will decrease Outcome: Progressing   Problem: Tissue Perfusion: Goal: Adequacy of tissue perfusion will improve Outcome: Progressing   Problem: Education: Goal: Knowledge about tracheostomy care/management will improve Outcome: Progressing   Problem: Activity: Goal: Ability to tolerate increased activity will improve Outcome: Progressing   Problem: Health Behavior/Discharge Planning: Goal: Ability to manage tracheostomy will improve Outcome: Progressing   Problem: Education: Goal: Knowledge of General Education information will improve Description: Including pain rating scale, medication(s)/side effects and non-pharmacologic comfort measures Outcome: Progressing   Problem: Health Behavior/Discharge Planning: Goal: Ability to manage health-related needs will improve Outcome: Progressing   Problem: Clinical Measurements: Goal: Ability to maintain clinical measurements within normal limits will improve Outcome: Progressing Goal: Will remain free from infection Outcome: Progressing Goal: Diagnostic test results will improve Outcome: Progressing Goal: Respiratory complications will improve Outcome: Progressing Goal: Cardiovascular complication will be avoided Outcome: Progressing    Problem: Activity: Goal: Risk for activity intolerance will decrease Outcome: Progressing   Problem: Nutrition: Goal: Adequate nutrition will be maintained Outcome: Progressing   Problem: Coping: Goal: Level of anxiety will decrease Outcome: Progressing   Problem: Elimination: Goal: Will not experience complications related to bowel motility Outcome: Progressing Goal: Will not experience complications related to urinary retention Outcome: Progressing   Problem: Pain Managment: Goal: General experience of comfort will improve and/or be controlled Outcome: Progressing   Problem: Safety: Goal: Ability to remain free from injury will improve Outcome: Progressing   Problem: Skin Integrity: Goal: Risk for impaired  skin integrity will decrease Outcome: Progressing

## 2023-11-03 NOTE — Progress Notes (Signed)
 Patient is nearing medical stability for discharge

## 2023-11-04 DIAGNOSIS — A419 Sepsis, unspecified organism: Secondary | ICD-10-CM | POA: Diagnosis not present

## 2023-11-04 DIAGNOSIS — R6521 Severe sepsis with septic shock: Secondary | ICD-10-CM | POA: Diagnosis not present

## 2023-11-04 LAB — CBC WITH DIFFERENTIAL/PLATELET
Abs Immature Granulocytes: 0.07 K/uL (ref 0.00–0.07)
Basophils Absolute: 0 K/uL (ref 0.0–0.1)
Basophils Relative: 0 %
Eosinophils Absolute: 0.1 K/uL (ref 0.0–0.5)
Eosinophils Relative: 2 %
HCT: 29.7 % — ABNORMAL LOW (ref 39.0–52.0)
Hemoglobin: 9.2 g/dL — ABNORMAL LOW (ref 13.0–17.0)
Immature Granulocytes: 2 %
Lymphocytes Relative: 23 %
Lymphs Abs: 0.9 K/uL (ref 0.7–4.0)
MCH: 30.8 pg (ref 26.0–34.0)
MCHC: 31 g/dL (ref 30.0–36.0)
MCV: 99.3 fL (ref 80.0–100.0)
Monocytes Absolute: 0.6 K/uL (ref 0.1–1.0)
Monocytes Relative: 16 %
Neutro Abs: 2.1 K/uL (ref 1.7–7.7)
Neutrophils Relative %: 57 %
Platelets: 331 K/uL (ref 150–400)
RBC: 2.99 MIL/uL — ABNORMAL LOW (ref 4.22–5.81)
RDW: 20.4 % — ABNORMAL HIGH (ref 11.5–15.5)
WBC: 3.7 K/uL — ABNORMAL LOW (ref 4.0–10.5)
nRBC: 0.5 % — ABNORMAL HIGH (ref 0.0–0.2)

## 2023-11-04 LAB — COMPREHENSIVE METABOLIC PANEL WITH GFR
ALT: 7 U/L (ref 0–44)
AST: 8 U/L — ABNORMAL LOW (ref 15–41)
Albumin: 2.2 g/dL — ABNORMAL LOW (ref 3.5–5.0)
Alkaline Phosphatase: 59 U/L (ref 38–126)
Anion gap: 4 — ABNORMAL LOW (ref 5–15)
BUN: 42 mg/dL — ABNORMAL HIGH (ref 6–20)
CO2: 25 mmol/L (ref 22–32)
Calcium: 8.4 mg/dL — ABNORMAL LOW (ref 8.9–10.3)
Chloride: 106 mmol/L (ref 98–111)
Creatinine, Ser: 1.28 mg/dL — ABNORMAL HIGH (ref 0.61–1.24)
GFR, Estimated: >60 mL/min (ref >60)
Glucose, Bld: 98 mg/dL (ref 70–99)
Potassium: 4.1 mmol/L (ref 3.5–5.1)
Sodium: 135 mmol/L (ref 135–145)
Total Bilirubin: 0.4 mg/dL (ref 0.0–1.2)
Total Protein: 4.9 g/dL — ABNORMAL LOW (ref 6.5–8.1)

## 2023-11-04 LAB — GLUCOSE, CAPILLARY
Glucose-Capillary: 159 mg/dL — ABNORMAL HIGH (ref 70–99)
Glucose-Capillary: 64 mg/dL — ABNORMAL LOW (ref 70–99)
Glucose-Capillary: 180 mg/dL — ABNORMAL HIGH (ref 70–99)
Glucose-Capillary: 175 mg/dL — ABNORMAL HIGH (ref 70–99)

## 2023-11-04 MED ORDER — SODIUM CHLORIDE 0.9 % IV SOLN
Freq: Once | INTRAVENOUS | Status: AC
Start: 2023-11-04 — End: 2023-11-04

## 2023-11-04 MED ORDER — ACETAMINOPHEN 325 MG PO TABS
650.0000 mg | ORAL_TABLET | Freq: Once | ORAL | Status: AC
  Administered 2023-11-04: 650 mg via ORAL
  Filled 2023-11-04: qty 2

## 2023-11-04 MED ORDER — SODIUM CHLORIDE 0.9 % IV SOLN
20.0000 mg | Freq: Once | INTRAVENOUS | Status: AC
  Administered 2023-11-04: 20 mg via INTRAVENOUS
  Filled 2023-11-04: qty 2

## 2023-11-04 MED ORDER — INSULIN GLARGINE 100 UNIT/ML ~~LOC~~ SOLN
10.0000 [IU] | Freq: Every day | SUBCUTANEOUS | Status: DC
  Administered 2023-11-05 – 2023-11-07 (×3): 10 [IU] via SUBCUTANEOUS
  Filled 2023-11-04 (×3): qty 0.1

## 2023-11-04 MED ORDER — DIPHENHYDRAMINE HCL 50 MG/ML IJ SOLN
50.0000 mg | Freq: Once | INTRAMUSCULAR | Status: AC
  Administered 2023-11-04: 50 mg via INTRAVENOUS
  Filled 2023-11-04: qty 1

## 2023-11-04 MED ORDER — FAMOTIDINE IN NACL 20-0.9 MG/50ML-% IV SOLN
20.0000 mg | Freq: Once | INTRAVENOUS | Status: AC
  Administered 2023-11-04: 20 mg via INTRAVENOUS
  Filled 2023-11-04: qty 50

## 2023-11-04 MED ORDER — SODIUM CHLORIDE 0.9 % IV SOLN
400.0000 mg/m2 | Freq: Once | INTRAVENOUS | Status: AC
  Administered 2023-11-04: 1000 mg via INTRAVENOUS
  Filled 2023-11-04: qty 50

## 2023-11-04 MED ORDER — ONDANSETRON HCL 4 MG/2ML IJ SOLN: 4.0000 mg | Freq: Four times a day (QID) | INTRAMUSCULAR | Status: DC | PRN

## 2023-11-04 MED ORDER — INSULIN GLARGINE-YFGN 100 UNIT/ML ~~LOC~~ SOLN: 10.0000 [IU] | SUBCUTANEOUS | Status: DC

## 2023-11-04 MED ORDER — PALONOSETRON HCL INJECTION 0.25 MG/5ML
0.2500 mg | Freq: Once | INTRAVENOUS | Status: AC
  Administered 2023-11-04: 0.25 mg via INTRAVENOUS
  Filled 2023-11-04: qty 5

## 2023-11-04 MED ORDER — DEXTROSE 5 % IV SOLN
56.0000 mg/m2 | Freq: Once | INTRAVENOUS | Status: AC
  Administered 2023-11-04: 120 mg via INTRAVENOUS
  Filled 2023-11-04: qty 60

## 2023-11-04 MED ORDER — SODIUM CHLORIDE 0.9 % IV SOLN
10.0000 mg/kg | Freq: Once | INTRAVENOUS | Status: AC
  Administered 2023-11-04: 1200 mg via INTRAVENOUS
  Filled 2023-11-04: qty 50

## 2023-11-04 NOTE — TOC Progression Note (Signed)
 Transition of Care (TOC) - Progression Note    Patient Details  Name: Frank [name]. MRN: [id number] Date of Birth: 1965/09/27  Transition of Care [facility]) CM/SW Contact  Lauraine FORBES Saa, LCSWA Phone Number: 11/04/2023, 3:40 PM  Clinical Narrative:     3:40 PM [facility] informed CSW of bed acceptance and would be able to admit patient Saturday. CSW relayed information to medical team, patient, and patient's significant other, Frank [name]. All were agreeable with patient discharging Saturday. CSW to submit SNF insurance authorization closer to patient's expected discharge date. Patient provided CSW with picture copies of [employer] [place]  Medicaid insurance card. CSW securely sent picture copies to financial counseling. CSW will continue to follow and be available to assist.  Expected Discharge Plan: Skilled Nursing Facility Barriers to Discharge: Continued Medical Work up, English as a second language teacher               Expected Discharge Plan and Services In-house Referral: Clinical Social Work Discharge Planning Services: [employer] Consult Post Acute Care Choice: Skilled Nursing Facility Living arrangements for the past 2 months: Single Family Home                 DME Arranged: N/A DME Agency: NA       HH Arranged: NA HH Agency: NA         Social Drivers of Health (SDOH) Interventions SDOH Screenings   Food Insecurity: No Food Insecurity (07/05/2023)  Housing: Low Risk  (07/05/2023)  Transportation Needs: No Transportation Needs (07/05/2023)  Utilities: Not At Risk (07/05/2023)  Tobacco Use: High Risk (10/13/2023)    Readmission Risk Interventions     No data to display

## 2023-11-04 NOTE — Plan of Care (Signed)
  Problem: Education: Goal: Knowledge of General Education information will improve Description: Including pain rating scale, medication(s)/side effects and non-pharmacologic comfort measures Outcome: Progressing   Problem: Health Behavior/Discharge Planning: Goal: Ability to manage health-related needs will improve Outcome: Progressing   Problem: Clinical Measurements: Goal: Ability to maintain clinical measurements within normal limits will improve Outcome: Progressing Goal: Will remain free from infection Outcome: Progressing Goal: Diagnostic test results will improve Outcome: Progressing Goal: Respiratory complications will improve Outcome: Progressing Goal: Cardiovascular complication will be avoided Outcome: Progressing   Problem: Activity: Goal: Risk for activity intolerance will decrease Outcome: Progressing   Problem: Nutrition: Goal: Adequate nutrition will be maintained Outcome: Progressing   Problem: Coping: Goal: Level of anxiety will decrease Outcome: Progressing   Problem: Elimination: Goal: Will not experience complications related to bowel motility Outcome: Progressing Goal: Will not experience complications related to urinary retention Outcome: Progressing   Problem: Pain Managment: Goal: General experience of comfort will improve and/or be controlled Outcome: Progressing   Problem: Safety: Goal: Ability to remain free from injury will improve Outcome: Progressing   Problem: Skin Integrity: Goal: Risk for impaired skin integrity will decrease Outcome: Progressing   Problem: Education: Goal: Ability to describe self-care measures that may prevent or decrease complications (Diabetes Survival Skills Education) will improve Outcome: Progressing Goal: Individualized Educational Video(s) Outcome: Progressing   Problem: Coping: Goal: Ability to adjust to condition or change in health will improve Outcome: Progressing   Problem: Fluid  Volume: Goal: Ability to maintain a balanced intake and output will improve Outcome: Progressing   Problem: Health Behavior/Discharge Planning: Goal: Ability to identify and utilize available resources and services will improve Outcome: Progressing Goal: Ability to manage health-related needs will improve Outcome: Progressing   Problem: Metabolic: Goal: Ability to maintain appropriate glucose levels will improve Outcome: Progressing   Problem: Nutritional: Goal: Maintenance of adequate nutrition will improve Outcome: Progressing Goal: Progress toward achieving an optimal weight will improve Outcome: Progressing   Problem: Skin Integrity: Goal: Risk for impaired skin integrity will decrease Outcome: Progressing   Problem: Tissue Perfusion: Goal: Adequacy of tissue perfusion will improve Outcome: Progressing   Problem: Education: Goal: Knowledge of General Education information will improve Description: Including pain rating scale, medication(s)/side effects and non-pharmacologic comfort measures Outcome: Progressing   Problem: Health Behavior/Discharge Planning: Goal: Ability to manage health-related needs will improve Outcome: Progressing   Problem: Clinical Measurements: Goal: Ability to maintain clinical measurements within normal limits will improve Outcome: Progressing Goal: Will remain free from infection Outcome: Progressing Goal: Diagnostic test results will improve Outcome: Progressing Goal: Respiratory complications will improve Outcome: Progressing Goal: Cardiovascular complication will be avoided Outcome: Progressing   Problem: Activity: Goal: Risk for activity intolerance will decrease Outcome: Progressing   Problem: Nutrition: Goal: Adequate nutrition will be maintained Outcome: Progressing   Problem: Coping: Goal: Level of anxiety will decrease Outcome: Progressing   Problem: Elimination: Goal: Will not experience complications related to  bowel motility Outcome: Progressing Goal: Will not experience complications related to urinary retention Outcome: Progressing   Problem: Pain Managment: Goal: General experience of comfort will improve and/or be controlled Outcome: Progressing   Problem: Safety: Goal: Ability to remain free from injury will improve Outcome: Progressing   Problem: Skin Integrity: Goal: Risk for impaired skin integrity will decrease Outcome: Progressing

## 2023-11-04 NOTE — Progress Notes (Signed)
 TRH   ROUNDING   NOTE Frank [name]. 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 14 days and this was apparently stopped Depression  He was admitted from [facility] 4/13 through 4/16 with sepsis from community-acquired pneumonia symptomatic anemia and lytic bone lesions concerning for myeloma-labs there showed a hemoglobin of 6 received 3 units creatinine improved from 5.8-5.3 IgG was 1304 28 IgA 85 IgM less than 8   Trasfered to [facility] initially and [name] saw the patient He stayed in the ICU for 36 days underwent trach also HD/CRRT 4/17: Admitted to medical floor with oncology consultation to treat multiple myeloma 4/19: Worsening respiratory status/encephalopathy, transferred to ICU -> intubated  4/20: A-fib with RVR. Started heparin . Developed groin and biopsy site hematoma requiring multiple transfusions 4/22: Oncology started Velcade  and Cytoxan   5/2: LP performed  5/8: Trach placed. Some post-op bleeding improved with thrombi-pad 5/10: Voriconazole  added back based on CT Chest findings 5/13: Able to follow some commands. 5/23: Transferred OOU 5/29: PEG placed 6/5: Last dose Kyprolis /Cytoxan , first dose Sarclisa  6/13: TDC removed for line holiday 6/16: New HD catheter placed 6/23: No HD for 1 week, removed HD catheter  6/28: Finished first cycle of chemo, next cycle to start in 2 weeks 7/11 IR drain placed right hip for infected hematoma  7/11 7/17 bilateral lower extremity DVTs 7/28 Because of close fracture left distal humerus pathological fracture he ultimately underwent amputation of left arm through humerus on 7/28 8/7 IR perform sclerotherapy of the infected hematoma-second attempt 8/14 imaging right hip suggestive right buttock pigtail catheter with fluid collection 11 x 5 cm, collection anterior right thigh incompletely evacuated 8 x 6 cm-appears relatively stable 8/18 nephrology curb sided feel patient can follow in the outpatient no further needs for workup of mild azotemia at this time 8/19 3rd dose Chemo given--now await SNF in [place]  Plan  Multiple myeloma Port-A-Cath placed 7/30 and started on Velcade  Cytoxan  6/5-Dr. --he received cycle 2 of carfilzomid/dexamethasone /Isatuximab  on 8/12  Oncologist to consolidate treatment at  [facility] in [place] of chemo 8/19 given--he is a bit tired today.  I let him know to drink copious fluid Continues acyclovir  400 twice daily  Erythropoietin  etc. 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 Wound VAC removed 8/1 dressing changes etc. as per [name] stop doxycycline  started on 8/11-->8/16 for this indication--- wound clean no ooze and reviewed by Ortho trauma 8/18 and they agree--they will follow in their office 2 weeks post-dc--wil cc my note from today to them wash LUE incision daily with soap and water . Change dressing daily with gauze, kerlix, ace wrap. Ok to leave open to air if no drainage Oxy IR 10-15 q3 , gabapentin  200 at bedtime can use Dilaudid  0.5-1 every 4 as needed severe pain,  continues Flexeril  5 3 times daily, gabapentin  200 at bedtime  Infected gluteal hematoma-status post drain placement 7/11 with exchange 7/25-doxycycline   sclerotherapy performed 7/25 doxycycline  8/15-IR, general surgery concur after 45-minute discussions that it is best to remove the drainage catheter and observe the patient for several days for signs symptoms of infection 8/17 right gluteal/thigh area examined although significant dependent edema no real ooze etc. 8/19 dressings removed--no issues and stable  AKI requiring CRRT HD initially Kidney function -discussed with pharmacy thought to be secondary to valproate toxicity-bladder scan negative- Felt possibly ? Chemo effect Stop routine labs but chemistry tomorrow only---copious oral fluids ordered discussed with Dr. 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, neuropathy nephropathy Moderately controlled CNG's below 180--slight low today cut back lantus  to 10 Short acting 2 units 3 times a day and at bedtime and sliding scale coverage moderate Gabapentin  as above  OHSS habitus with OSA Discussed with RT home settings appear to be 8/5 at 21% FiO2  Patient unable to tolerate mask discharging on 2 L oxygen at discharge  Sacral decubiti Sacrum reviewed has stage I and looks clean he needs to turn more often in the bed Turn q 2  Protein energy malnutrition As above-continue therapies BMI 33  Current resolved/stable issues- sepsis in the setting of staph epi bacteremia--completed antibiotics with Zyvox  on 7/26 Dysphagia resolved, PEG tube removed as above Aspergillosis with pneumonia-completed voriconazole  earlier in hospital stay Bipolar-continues hydroxyzine  10 3 times  daily as needed anxiety, Paxil  40, Depakote  750 twice daily   Discussed with wife on phone 8/19--concurs and understand plan for Saturday d/c to SNF   Data Reviewed:   Sodium 135, k4.1, bun cr 42/1.28 Wbc 3.7, Hb 9.2 Plt 331  DVT prophylaxis: SCD, Eliquis   Status is: Inpatient Remains inpatient appropriate because:   Awaiting skilled once drainage decreases-accepted by [facility] oncology     Subjective:  No complaints--happy appearing but  a little tired from chemo No cp fever  Objective + exam Vitals:   11/04/23 0750 11/04/23 0811 11/04/23 1409 11/04/23 1705  BP:  94/67 101/60 108/73  Pulse:  92  92  Resp:      Temp:  97.6 F (36.4 C)  97.8 F (36.6 C)  TempSrc:  Oral  Oral  SpO2: 94% 92%  94%  Weight:      Height:       Filed Weights   10/23/23 0422 10/30/23 0700 11/04/23 0424  Weight: 115 kg 112 kg 113.5 kg     Examination:  Pleasant coherent no distress looks fair feels okay Chest is clear port inside right chest Wounds examined  on back prior--reports clean LUE bandage in place Abdomen obese nontender Pitting edema Thigh, sacrum  [dependant]  Scheduled Meds:  (feeding supplement) PROSource Plus  30 mL Oral TID BM   acyclovir   400 mg Oral BID   apixaban   5 mg Oral BID   collagenase    Topical Daily   docusate sodium   100 mg Oral BID   epoetin  alfa  40,000 Units Subcutaneous Q Wed-1800   feeding  supplement  237 mL Oral BID WC   gabapentin   200 mg Oral QHS   guaiFENesin   600 mg Oral BID   heparin  lock flush  500 Units Intravenous Once   hydrALAZINE   25 mg Oral Q8H   insulin  aspart  0-15 Units Subcutaneous TID AC & HS   insulin  aspart  2 Units Subcutaneous TID AC & HS   [START ON 11/05/2023] insulin  glargine-yfgn  10 Units Subcutaneous Q24H   leptospermum manuka honey  1 Application Topical Daily   lidocaine   1 patch Transdermal Q24H   montelukast   5 mg Oral QHS   multivitamin with minerals  1 tablet Oral Daily   nutrition supplement (JUVEN)   1 packet Oral BID AC   mouth rinse  15 mL Mouth Rinse 4 times per day   pantoprazole   40 mg Oral BID   PARoxetine   40 mg Oral Daily   senna  1 tablet Oral Daily   umeclidinium-vilanterol  1 puff Inhalation Daily   valproic  acid  750 mg Oral BID   verapamil   120 mg Oral Daily   Continuous Infusions:  sodium chloride  Stopped (10/13/23 0952)   sodium chloride  Stopped (08/28/23 9160)   sodium chloride       Time 45 minutes  Frank Yvone Slape, MD  [facility]

## 2023-11-04 NOTE — Progress Notes (Signed)
 Mr Lincoln received his chemo treatment inpatient today. Consent was on file. Port a cath was flushed and blood return was verified. Patient received his Fluid Bolus prior to Kyprolis  infusion. He tolerated all there infusions well with no issues. Chemo Line waste was disposed of approprietly. Hazardous drug placard on door was updated. Patient Voiced appreciation for treatment. All questions answered.

## 2023-11-04 NOTE — Progress Notes (Signed)
 Nutrition Follow-up  DOCUMENTATION CODES:   Severe malnutrition in context of acute illness/injury  INTERVENTION:   - Continue Ensure Plus High Protein po BID, each supplement provides 350 kcal and 20 grams of protein  - Continue PROSource Plus 30 mL TID, each supplement provides 100 kcal and 15 grams of protein  - Continue Juven po BID to support wound healing, each packet provides 95 kcal and 2.5 grams of protein  - Continue MVI with minerals daily  NUTRITION DIAGNOSIS:   Severe Malnutrition related to acute illness (prolonged illness, interuptions in feeding) as evidenced by moderate fat depletion, moderate muscle depletion, percent weight loss (9.6% x 1 month).  Ongoing, being addressed via oral nutrition supplements  GOAL:   Patient will meet greater than or equal to 90% of their needs  Progressing  MONITOR:   PO intake, Supplement acceptance, Diet advancement  REASON FOR ASSESSMENT:   Consult Assessment of nutrition requirement/status  ASSESSMENT:   58 y.o. male with PMH of obesity, asthma, OSA on CPAP, GERD, HTN, HLD who presented due to feeling weak with AMS for a few days. Admitted with concern for sepsis, community-acquired pneumonia, encephalopathy as well as concern for multiple myeloma.  4/17 - admit to [facility] 4/21 - SLP evaluation with recommendations for NPO, IR attempted Cortrak but unable to place, CCM MD successfully placed NG tube 4/22 - concern for aspiration overnight, intubated 4/23 - CRRT 5/08 - s/p tracheostomy 5/10 - CRRT discontinued 5/11 - transferred to [facility] 5/12 - iHD initiated  5/29 - PEG placed 5/31 - chemotherapy changed to Sarclisa /Kyprolis /Cytoxan  6/09 - MBS, NPO recommended  6/11 - trach removed 6/13 - BSE, NPO, tunneled HD catheter removed for line holiday 6/16 - tunneled HD replaced,  6/18 - FEES, thin liquids when supervised 6/19 - BSE, clear liquids 6/21 - BSE, clear liquids 6/23 - MBS, clear liquids, pt not  able to fully participate in study due to pain, no HD since 6/16, line pulled (pt showing signs of renal recovery) 6/24 - FEES, thin liquids and puree 6/27 - BSE with recommendation for dysphagia 1 diet and thin liquids 7/10 - pt with R hip/thigh hematoma 7/11 - s/p BSE with recommendation for dysphagia 2 diet with thin liquids 7/14 - s/p BSE with recommendation for dysphagia 3 diet with thin liquids 7/16 - advanced to regular diet per MD (oncology), PEG tube removed 7/28 - L arm amputation through humerus 7/30 - s/p Port-A-Cath placement with IR 8/01 - VAC to LUE removed 8/05 - chemotherapy restarted 8/07 - s/p repeat sclerotherapy for infected R flank/gluteal hematoma  Noted plan for discharge to rehab facility closer to home at some point this week.  Pt currently on a Carb Modified diet with excellent PO intake. Pt also appears to be accepting all supplements ordered including PROSource Plus, Juven, and Ensure Plus High Protein. Will continue current nutrition regimen.  Noted new weight obtained today of 113.5 kg which is up from 112 kg on 10/30/23 and down from 115 kg on 10/23/23. Will continue to monitor weight trends. Noted pt with mild pitting edema to BLE.  Meal Completion: 50-100% x last 8 documented meal completions  Medications reviewed and include: PROSource Plus 30 mL TID between meals, acyclovir , colace, Epogen  injection weekly, Ensure Plus High Protein BID, SSI TID with meals and at bedtime, Novolog  2 units TID with meals and at bedtime, Semglee  13 units daily, MVI with minerals, Juven BID, protonix , senna  Labs reviewed: BUN 42, creatinine 1.28, hemoglobin 9.2  CBG's: 88-159 x 24 hours  UOP: 3100 mL x 24 hours  Diet Order:   Diet Order             Diet Carb Modified Fluid consistency: Thin; Room service appropriate? Yes  Diet effective now                   EDUCATION NEEDS:   Not appropriate for education at this time  Skin:  Skin Assessment: Skin Integrity  Issues: Stage II: L buttocks Unstageable: bilateral heels, sacrum Incisions: L arm Other: pressure injury to bilateral buttocks  Last BM:  11/04/23 smear type 5  Height:   Ht Readings from Last 1 Encounters:  10/01/23 6' (1.829 m)    Weight:   Wt Readings from Last 1 Encounters:  11/04/23 113.5 kg    Ideal Body Weight:  80.9 kg  BMI:  Body mass index is 33.94 kg/m.  Estimated Nutritional Needs:   Kcal:  2400-2700 kcal/d  Protein:  130-150g/d  Fluid:  2.5L/d    Mallie Satchel, MS, RD, LDN Registered Dietitian II Please see AMiON for contact information.

## 2023-11-04 NOTE — Progress Notes (Signed)
 Physical Therapy Treatment Patient Details Name: Frank [name]. MRN: [id number] DOB: 1966/03/06 Today's Date: 11/04/2023   History of Present Illness 58 y.o. male transferred from [facility] 07/03/23 to [facility] for management of newly diagnosed plasma cell myeloma with weakness and AMS. Pt also with AKI and metabolic encephalopathy. 5/10 transfer to [facility] for iHD. 4/22 Intubated and chemo initiated. 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 5/6 IVIG started. 5/8-6/11 trach. 5/12 iHD initiated. 5/29 PEG placed. 6/5 chemo initiated. 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 Pt reporting pain in his L UE residual limb after the removal of remaining staples in the posterior of his arm. Pt is able to come to EoB with min A. Once there he worked on [employer] transfer to wheelchair. Pt needs increased cuing for body mechanic but is overall CGA with bed elevated and lowered to provide increased assistance. Once in wheelchair pt is able to propel chair with B LE and R UE over 100 feet without assist. Pt does need min A for removal of slide board and return to bed due to increased fatigue. D/c plan remains appropriate. PT will continue to follow acutely.     If plan is discharge home, recommend the following: Two people to help with walking and/or transfers;Assistance with cooking/housework;Assist for transportation;Two people to help with bathing/dressing/bathroom;Direct supervision/assist for medications management;Direct supervision/assist for financial management;Help with stairs or ramp for entrance;Assistance with feeding   Can travel by private vehicle     No  Equipment Recommendations  Wheelchair (measurements PT);Wheelchair cushion (measurements PT)       Precautions /  Restrictions Precautions Precautions: Fall Recall of Precautions/Restrictions: Intact Restrictions Weight Bearing Restrictions Per Provider Order: Yes LUE Weight Bearing Per Provider Order: Non weight bearing Other Position/Activity Restrictions: ROM to tolerance     Mobility  Bed Mobility Overal bed mobility: Needs Assistance Bed Mobility: Supine to Sit     Supine to sit: Min assist, HOB elevated     General bed mobility comments: head of bed minimally elevated, pt able to manage LE off EoB, and needs min A for bringing trunk to upright where he was able to scoot himself to EoB, needs min A for management of LE into the bed    Transfers Overall transfer level: Needs assistance Equipment used: Sliding board               General transfer comment: grossly CGA for scooting to and from the wheelchair, pt needs increased cuing for improved body mechanics to make transfer less effort. pt does need min A for removing slide board with transfer back to bed     Wheelchair Mobility Wheelchair Mobility Wheelchair mobility: Yes Wheelchair propulsion: Both upper extremities, Right upper extremity Wheelchair parts: Needs assistance (will need brake extender) Distance: 100 Wheelchair Assistance Details (indicate cue type and reason): pt needs some assist for management of drop arm and brakes. pt improves efficiency with distance      Balance Overall balance assessment: Needs assistance Sitting-balance support: Feet supported, No upper extremity supported Sitting balance-Leahy Scale: Fair Sitting balance - Comments: EOB  Communication Communication Communication: Impaired Factors Affecting Communication: Reduced clarity of speech  Cognition Arousal: Alert Behavior During Therapy: WFL for tasks assessed/performed   PT - Cognitive impairments: Sequencing, Problem solving, Safety/Judgement Difficult to assess due to: Impaired  communication                     PT - Cognition Comments: continues to be easily distracted and needs increased cuing Following commands: Impaired Following commands impaired: Follows one step commands with increased time    Cueing Cueing Techniques: Verbal cues, Tactile cues, Visual cues     General Comments General comments (skin integrity, edema, etc.): Pt receiving chemotherapy directly after therapy session      Pertinent Vitals/Pain Pain Assessment Pain Assessment: Faces Faces Pain Scale: Hurts even more Pain Location: BLEs and LUE amputation Pain Descriptors / Indicators: Aching, Discomfort, Guarding, Moaning Pain Intervention(s): Monitored during session, Patient requesting pain meds-RN notified     PT Goals (current goals can now be found in the care plan section) Acute Rehab PT Goals PT Goal Formulation: With patient Time For Goal Achievement: 11/06/23 Potential to Achieve Goals: Fair Progress towards PT goals: Progressing toward goals    Frequency    Min 5X/week           Co-evaluation PT/OT/SLP Co-Evaluation/Treatment: Yes Reason for Co-Treatment: For patient/therapist safety;To address functional/ADL transfers PT goals addressed during session: Mobility/safety with mobility;Balance OT goals addressed during session: ADL's and self-care;Strengthening/ROM      AM-PAC PT 6 Clicks Mobility   Outcome Measure  Help needed turning from your back to your side while in a flat bed without using bedrails?: A Lot Help needed moving from lying on your back to sitting on the side of a flat bed without using bedrails?: Total Help needed moving to and from a bed to a chair (including a wheelchair)?: Total Help needed standing up from a chair using your arms (e.g., wheelchair or bedside chair)?: Total Help needed to walk in hospital room?: Total Help needed climbing 3-5 steps with a railing? : Total 6 Click Score: 7    End of Session   Activity  Tolerance: Patient limited by pain;Patient limited by fatigue Patient left: with call bell/phone within reach;in bed;Other (comment) (chemo RN in room) Nurse Communication: Mobility status PT Visit Diagnosis: Other abnormalities of gait and mobility (R26.89);Muscle weakness (generalized) (M62.81);Difficulty in walking, not elsewhere classified (R26.2)     Time: 9098-9057 PT Time Calculation (min) (ACUTE ONLY): 41 min  Charges:    $Therapeutic Activity: 8-22 mins $Wheel Chair Management: 8-22 mins PT General Charges $$ ACUTE PT VISIT: 1 Visit                     Luciel Brickman B. Fleeta Lapidus PT, DPT Acute Rehabilitation Services Please use secure chat or  Call Office [phone]    [name] [facility] 11/04/2023, 9:56 AM

## 2023-11-04 NOTE — Plan of Care (Signed)
 Problem: Education: Goal: Knowledge of General Education information will improve Description: Including pain rating scale, medication(s)/side effects and non-pharmacologic comfort measures Outcome: Progressing   Problem: Health Behavior/Discharge Planning: Goal: Ability to manage health-related needs will improve Outcome: Progressing   Problem: Clinical Measurements: Goal: Ability to maintain clinical measurements within normal limits will improve Outcome: Progressing Goal: Will remain free from infection Outcome: Progressing Goal: Diagnostic test results will improve Outcome: Progressing Goal: Respiratory complications will improve Outcome: Progressing Goal: Cardiovascular complication will be avoided Outcome: Progressing   Problem: Activity: Goal: Risk for activity intolerance will decrease Outcome: Progressing   Problem: Nutrition: Goal: Adequate nutrition will be maintained Outcome: Progressing   Problem: Coping: Goal: Level of anxiety will decrease Outcome: Progressing   Problem: Elimination: Goal: Will not experience complications related to bowel motility Outcome: Progressing Goal: Will not experience complications related to urinary retention Outcome: Progressing   Problem: Pain Managment: Goal: General experience of comfort will improve and/or be controlled Outcome: Progressing   Problem: Safety: Goal: Ability to remain free from injury will improve Outcome: Progressing   Problem: Skin Integrity: Goal: Risk for impaired skin integrity will decrease Outcome: Progressing   Problem: Education: Goal: Ability to describe self-care measures that may prevent or decrease complications (Diabetes Survival Skills Education) will improve Outcome: Progressing Goal: Individualized Educational Video(s) Outcome: Progressing   Problem: Coping: Goal: Ability to adjust to condition or change in health will improve Outcome: Progressing   Problem: Fluid  Volume: Goal: Ability to maintain a balanced intake and output will improve Outcome: Progressing   Problem: Health Behavior/Discharge Planning: Goal: Ability to identify and utilize available resources and services will improve Outcome: Progressing Goal: Ability to manage health-related needs will improve Outcome: Progressing   Problem: Metabolic: Goal: Ability to maintain appropriate glucose levels will improve Outcome: Progressing   Problem: Nutritional: Goal: Maintenance of adequate nutrition will improve Outcome: Progressing Goal: Progress toward achieving an optimal weight will improve Outcome: Progressing   Problem: Skin Integrity: Goal: Risk for impaired skin integrity will decrease Outcome: Progressing   Problem: Tissue Perfusion: Goal: Adequacy of tissue perfusion will improve Outcome: Progressing   Problem: Education: Goal: Knowledge about tracheostomy care/management will improve Outcome: Progressing   Problem: Activity: Goal: Ability to tolerate increased activity will improve Outcome: Progressing   Problem: Health Behavior/Discharge Planning: Goal: Ability to manage tracheostomy will improve Outcome: Progressing   Problem: Education: Goal: Knowledge of General Education information will improve Description: Including pain rating scale, medication(s)/side effects and non-pharmacologic comfort measures Outcome: Progressing   Problem: Health Behavior/Discharge Planning: Goal: Ability to manage health-related needs will improve Outcome: Progressing   Problem: Clinical Measurements: Goal: Ability to maintain clinical measurements within normal limits will improve Outcome: Progressing Goal: Will remain free from infection Outcome: Progressing Goal: Diagnostic test results will improve Outcome: Progressing Goal: Respiratory complications will improve Outcome: Progressing Goal: Cardiovascular complication will be avoided Outcome: Progressing    Problem: Activity: Goal: Risk for activity intolerance will decrease Outcome: Progressing   Problem: Nutrition: Goal: Adequate nutrition will be maintained Outcome: Progressing   Problem: Coping: Goal: Level of anxiety will decrease Outcome: Progressing   Problem: Elimination: Goal: Will not experience complications related to bowel motility Outcome: Progressing Goal: Will not experience complications related to urinary retention Outcome: Progressing   Problem: Pain Managment: Goal: General experience of comfort will improve and/or be controlled Outcome: Progressing   Problem: Safety: Goal: Ability to remain free from injury will improve Outcome: Progressing   Problem: Skin Integrity: Goal: Risk for impaired  skin integrity will decrease Outcome: Progressing

## 2023-11-04 NOTE — Progress Notes (Signed)
 Overall, Frank [name] is doing well.  He had some physical therapy yesterday.   Orthopedic Surgery came by to see him to help with the amputated left arm.  He is eating well.  He has had no problems with nausea or vomiting.  He has had no fever.  He has had no problems with bowels or bladder.  He is getting IV iron.  This will hopefully help with his anemia.  He is due for another week of the chemotherapy today.  After today, he will not get treated for another couple weeks.  It sounds like, from what he tells me, that he might be going up to [place]  either tomorrow or Thursday.  His labs show sodium 135.  Potassium 4.1.  BUN 42 creatinine 1.28.  Calcium  8.4 with an albumin  of 2.2.  His white cell count 3.7.  Hemoglobin 9.2.  Platelet count 331,000.  He has had no rashes.  He continues on Eliquis .  There is been no bleeding.  His vital signs show temperature of 97.9.  Pulse 88.  Blood pressure 101/77.  His lungs are clear bilaterally.  He has good air movement bilaterally.  Cardiac exam regular rate and rhythm with no murmurs, rubs or bruits.  Abdomen is soft.  Bowel sounds are present.  There is no fluid wave.  There is no palpable liver or spleen tip.  Extremities shows a left upper arm amputation.  This appears to be healed.  Lower extremities shows some trace edema.  Neurological exam shows no focal neurological deficits.  Again, Frank [name] has myeloma.  He has responded incredibly well.  He will get his within third week of treatment.  After this, he is off for a couple of weeks.  Hopefully, he will be able to go up to [place]  to a rehab center this week.  I know he has had incredible care from everybody on [facility].  Jeralyn Crease, MD  [place] 14:14

## 2023-11-04 NOTE — Plan of Care (Signed)
  Problem: Safety: Goal: Ability to remain free from injury will improve Outcome: Progressing   Problem: Skin Integrity: Goal: Risk for impaired skin integrity will decrease Outcome: Progressing   Problem: Pain Managment: Goal: General experience of comfort will improve and/or be controlled Outcome: Progressing   Problem: Coping: Goal: Level of anxiety will decrease Outcome: Progressing   Problem: Nutrition: Goal: Adequate nutrition will be maintained Outcome: Progressing   Problem: Activity: Goal: Risk for activity intolerance will decrease Outcome: Progressing   Problem: Health Behavior/Discharge Planning: Goal: Ability to manage tracheostomy will improve Outcome: Progressing   Problem: Metabolic: Goal: Ability to maintain appropriate glucose levels will improve Outcome: Progressing

## 2023-11-04 NOTE — Progress Notes (Signed)
 Occupational Therapy Treatment Patient Details Name: Frank [name]. MRN: [id number] DOB: 12-10-1965 Today's Date: 11/04/2023   History of present illness 58 y.o. male transferred from [facility] 07/03/23 to [facility] for management of newly diagnosed plasma cell myeloma with weakness and AMS. Pt also with AKI and metabolic encephalopathy. 5/10 transfer to [facility] for iHD. 4/22 Intubated and chemo initiated. 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 5/6 IVIG started. 5/8-6/11 trach. 5/12 iHD initiated. 5/29 PEG placed. 6/5 chemo initiated. 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 Patient demonstrating good recall of sliding board placement with cues to correct. Patient able to perform slide board transfer to wheelchair with CGA and verbal cues. Patient performed wheelchair mobility in hallway before returning to bed due to chemotherapy. Patient required increased cues for sliding board placement to return to EOB and min assist. Patient will benefit from continued inpatient follow up therapy, <3 hours/day. Acute OT to continue to follow to address established goals to facilitate DC to next venue of care.        If plan is discharge home, recommend the following:  Two people to help with walking and/or transfers;Assistance with cooking/housework;Assistance with feeding;Direct supervision/assist for medications management;Direct supervision/assist for financial management;Assist for transportation;Help with stairs or ramp for entrance;A lot of help with bathing/dressing/bathroom   Equipment Recommendations  Other (comment) (TBD)    Recommendations for Other Services      Precautions / Restrictions Precautions Precautions: Fall Recall of Precautions/Restrictions: Intact Restrictions Weight Bearing Restrictions Per Provider  Order: Yes LUE Weight Bearing Per Provider Order: Non weight bearing Other Position/Activity Restrictions: ROM to tolerance       Mobility Bed Mobility Overal bed mobility: Needs Assistance Bed Mobility: Supine to Sit     Supine to sit: Min assist, HOB elevated     General bed mobility comments: head of bed minimally elevated, pt able to manage LE off EoB, and needs min A for bringing trunk to upright where he was able to scoot himself to EoB, needs min A for management of LE into the bed    Transfers Overall transfer level: Needs assistance Equipment used: Sliding board               General transfer comment: grossly CGA for scooting to and from the wheelchair, pt needs increased cuing for improved body mechanics to make transfer less effort. pt does need min A for removing slide board with transfer back to bed     Balance Overall balance assessment: Needs assistance Sitting-balance support: Feet supported, No upper extremity supported Sitting balance-Leahy Scale: Fair Sitting balance - Comments: EOB                                   ADL either performed or assessed with clinical judgement   ADL Overall ADL's : Needs assistance/impaired                 Upper Body Dressing : Minimal assistance   Lower Body Dressing: Maximal assistance;Bed level Lower Body Dressing Details (indicate cue type and reason): Max assisst to donn shorts with patient assisting with pulling up shorts from knees, requires assistance to pull up over hips with rolling in bed. Patient able to donn slip on shoes with  setup               General ADL Comments: shorts donned at bed level due to difficulty standing to pull up shorts    Extremity/Trunk Assessment              Vision       Perception     Praxis     Communication Communication Communication: Impaired Factors Affecting Communication: Reduced clarity of speech   Cognition Arousal:  Alert Behavior During Therapy: WFL for tasks assessed/performed Cognition: Cognition impaired             OT - Cognition Comments: Patient demonstrated good recall of sliding board placement and transfers                 Following commands: Impaired Following commands impaired: Follows one step commands with increased time      Cueing   Cueing Techniques: Verbal cues, Tactile cues, Visual cues  Exercises Exercises: Other exercises Other Exercises Other Exercises: wheelchair mobility performed in hallway    Shoulder Instructions       General Comments patient was assisted back to bed at end of session due to receiving chemotherapy    Pertinent Vitals/ Pain       Pain Assessment Pain Assessment: Faces Faces Pain Scale: Hurts even more Pain Location: BLEs and LUE amputation Pain Descriptors / Indicators: Aching, Discomfort, Guarding, Moaning Pain Intervention(s): Limited activity within patient's tolerance, Monitored during session, Repositioned  Home Living                                          Prior Functioning/Environment              Frequency  Min 1X/week (on STAR program)        Progress Toward Goals  OT Goals(current goals can now be found in the care plan section)  Progress towards OT goals: Progressing toward goals  Acute Rehab OT Goals Patient Stated Goal: go back to [place] OT Goal Formulation: With patient Time For Goal Achievement: 11/12/23 Potential to Achieve Goals: Fair ADL Goals Pt Will Perform Grooming: sitting;with set-up (A/E PRN) Pt Will Perform Upper Body Dressing: sitting;with contact guard assist;with adaptive equipment Pt Will Perform Lower Body Dressing: sitting/lateral leans;sit to/from stand;with mod assist (using one-handed RUE techniques) Pt Will Transfer to Toilet: with contact guard assist;bedside commode;with transfer board Pt/caregiver will Perform Home Exercise Program: Increased ROM;Left  upper extremity;With written HEP provided;Independently Additional ADL Goal #1: Pt will complete bed mobility with min A as a precursor to EOB ADLs Additional ADL Goal #2: Pt will demonstrate ability to stand statically for >3 mins to demonstrate improving tolerance in prep for OOB mobility Additional ADL Goal #3: Pt will demonstrate independence with sensory stimulation to LUE to improve phantom symptoms.  Plan      Co-evaluation    PT/OT/SLP Co-Evaluation/Treatment: Yes Reason for Co-Treatment: For patient/therapist safety;To address functional/ADL transfers PT goals addressed during session: Mobility/safety with mobility;Balance OT goals addressed during session: ADL's and self-care;Strengthening/ROM      AM-PAC OT 6 Clicks Daily Activity     Outcome Measure   Help from another person eating meals?: A Little Help from another person taking care of personal grooming?: A Little Help from another person toileting, which includes using toliet, bedpan, or urinal?: Total Help from another person bathing (including washing, rinsing, drying)?: A Lot  Help from another person to put on and taking off regular upper body clothing?: A Little Help from another person to put on and taking off regular lower body clothing?: A Lot 6 Click Score: 14    End of Session Equipment Utilized During Treatment: Gait belt;Other (comment) (sliding board and wheelchair)  OT Visit Diagnosis: Unsteadiness on feet (R26.81);Other abnormalities of gait and mobility (R26.89);Muscle weakness (generalized) (M62.81);Pain Pain - Right/Left: Left Pain - part of body: Leg;Shoulder (and RLE)   Activity Tolerance Patient tolerated treatment well   Patient Left in bed;with call bell/phone within reach;with nursing/sitter in room   Nurse Communication Mobility status        Time: 9099-9055 OT Time Calculation (min): 44 min  Charges: OT General Charges $OT Visit: 1 Visit OT Treatments $Self Care/Home  Management : 8-22 mins  [name], OTA Acute Rehabilitation Services  Office [phone]   [name] 11/04/2023, 2:20 PM

## 2023-11-05 DIAGNOSIS — A419 Sepsis, unspecified organism: Secondary | ICD-10-CM | POA: Diagnosis not present

## 2023-11-05 DIAGNOSIS — C9000 Multiple myeloma not having achieved remission: Secondary | ICD-10-CM | POA: Diagnosis not present

## 2023-11-05 DIAGNOSIS — R6521 Severe sepsis with septic shock: Secondary | ICD-10-CM | POA: Diagnosis not present

## 2023-11-05 DIAGNOSIS — R531 Weakness: Secondary | ICD-10-CM | POA: Diagnosis not present

## 2023-11-05 DIAGNOSIS — N179 Acute kidney failure, unspecified: Secondary | ICD-10-CM | POA: Diagnosis not present

## 2023-11-05 LAB — CBC WITH DIFFERENTIAL/PLATELET
Abs Immature Granulocytes: 0.28 K/uL — ABNORMAL HIGH (ref 0.00–0.07)
Basophils Absolute: 0 K/uL (ref 0.0–0.1)
Basophils Relative: 0 %
Eosinophils Absolute: 0 K/uL (ref 0.0–0.5)
Eosinophils Relative: 0 %
HCT: 27.8 % — ABNORMAL LOW (ref 39.0–52.0)
Hemoglobin: 8.8 g/dL — ABNORMAL LOW (ref 13.0–17.0)
Immature Granulocytes: 5 %
Lymphocytes Relative: 9 %
Lymphs Abs: 0.6 K/uL — ABNORMAL LOW (ref 0.7–4.0)
MCH: 31.4 pg (ref 26.0–34.0)
MCHC: 31.7 g/dL (ref 30.0–36.0)
MCV: 99.3 fL (ref 80.0–100.0)
Monocytes Absolute: 0.6 K/uL (ref 0.1–1.0)
Monocytes Relative: 10 %
Neutro Abs: 4.4 K/uL (ref 1.7–7.7)
Neutrophils Relative %: 76 %
Platelets: 277 K/uL (ref 150–400)
RBC: 2.8 MIL/uL — ABNORMAL LOW (ref 4.22–5.81)
RDW: 21 % — ABNORMAL HIGH (ref 11.5–15.5)
WBC: 5.9 K/uL (ref 4.0–10.5)
nRBC: 0.3 % — ABNORMAL HIGH (ref 0.0–0.2)

## 2023-11-05 LAB — COMPREHENSIVE METABOLIC PANEL WITH GFR
ALT: 6 U/L (ref 0–44)
AST: 9 U/L — ABNORMAL LOW (ref 15–41)
Albumin: 2 g/dL — ABNORMAL LOW (ref 3.5–5.0)
Alkaline Phosphatase: 55 U/L (ref 38–126)
Anion gap: 7 (ref 5–15)
BUN: 40 mg/dL — ABNORMAL HIGH (ref 6–20)
CO2: 23 mmol/L (ref 22–32)
Calcium: 7.9 mg/dL — ABNORMAL LOW (ref 8.9–10.3)
Chloride: 105 mmol/L (ref 98–111)
Creatinine, Ser: 1.53 mg/dL — ABNORMAL HIGH (ref 0.61–1.24)
GFR, Estimated: 52 mL/min — ABNORMAL LOW (ref >60)
Glucose, Bld: 124 mg/dL — ABNORMAL HIGH (ref 70–99)
Potassium: 3.9 mmol/L (ref 3.5–5.1)
Sodium: 135 mmol/L (ref 135–145)
Total Bilirubin: 0.6 mg/dL (ref 0.0–1.2)
Total Protein: 4.6 g/dL — ABNORMAL LOW (ref 6.5–8.1)

## 2023-11-05 LAB — GLUCOSE, CAPILLARY
Glucose-Capillary: 109 mg/dL — ABNORMAL HIGH (ref 70–99)
Glucose-Capillary: 97 mg/dL (ref 70–99)
Glucose-Capillary: 121 mg/dL — ABNORMAL HIGH (ref 70–99)
Glucose-Capillary: 120 mg/dL — ABNORMAL HIGH (ref 70–99)

## 2023-11-05 LAB — URIC ACID: Uric Acid, Serum: 6.3 mg/dL (ref 3.7–8.6)

## 2023-11-05 LAB — LACTATE DEHYDROGENASE: LDH: 126 U/L (ref 98–192)

## 2023-11-05 MED ORDER — ONDANSETRON HCL 4 MG/2ML IJ SOLN: 4.0000 mg | Freq: Three times a day (TID) | INTRAMUSCULAR | Status: DC

## 2023-11-05 MED ORDER — HYDROMORPHONE HCL 2 MG PO TABS
2.0000 mg | ORAL_TABLET | Freq: Four times a day (QID) | ORAL | Status: DC | PRN
  Administered 2023-11-05 – 2023-11-07 (×4): 2 mg via ORAL
  Filled 2023-11-05 (×4): qty 1

## 2023-11-05 MED ORDER — ONDANSETRON HCL 4 MG/2ML IJ SOLN
4.0000 mg | Freq: Three times a day (TID) | INTRAMUSCULAR | Status: DC
  Administered 2023-11-07: 4 mg via INTRAVENOUS
  Filled 2023-11-05: qty 2

## 2023-11-05 NOTE — Progress Notes (Signed)
 Frank [name] did well with chemotherapy yesterday.  He is little bit nauseated.  I will give him some scheduled Zofran .  Sounds like he will be going back up to [place]  on Saturday to the rehab facility.  That would be fantastic if he did.  He did do some physical therapy yesterday.  He has had no problems with bowels or bladder.  There is been no fever.  He has had no bleeding.  He is on anticoagulation.  He does have pain with the left arm post amputation.  This may be phantom pain.  He has had no rashes.  His appetite has been okay.  Again, he has little bit of nausea.  His labs show sodium 135.  Potassium 3.9.  BUN 40 creatinine 1.53.  Calcium  7.9 with an albumin  of 2.0.  His white cell count is 5.9.  Hemoglobin 8.8.  Platelet count 277,000.  His vital signs show temperature of 98.4.  Pulse 97.  Blood pressure 116/80.  His head and neck exam shows no ocular or oral lesions.  He has no adenopathy in the neck.  Lungs are clear bilaterally.  He has no wheezing.  Cardiac exam regular rate and rhythm.  Abdomen is soft.  Bowel sounds are present.  He has no fluid wave.  There is no guarding or rebound tenderness.  There is no palpable hepatomegaly.  Extremities shows some slight chronic mild edema in the legs.  He does have good range of motion of his legs.  The left arm amputation stump is dressed.  Neurological exam is nonfocal.   We will wait for him to go to [place]  on Saturday.  Again I will give him some scheduled Zofran .  Renal function is up a little bit.  He did get some IV iron.  I will give him a dose of erythropoietin  to help with his anemia.   Jeralyn Crease, MD  [place] [phone]

## 2023-11-05 NOTE — Consult Note (Signed)
 WOC Nurse wound follow up Wound type: Stage 3 pressure injury to Sacrum Measurement: 4 cm x 1 cm Wound bed: red, moist, hypergranulation tissue present Drainage (amount, consistency, odor) serosanguinous Periwound: macerated Dressing procedure/placement/frequency:   Cleanse wound with NS, pat dry.  Place a strip of calcium  alginate over wound bed (lawson # [id number]) and cover with silicone foam dressing.  Change daily.  WOC Nurse team will follow with you and see patient within 10 days for wound assessments.  Please notify WOC nurses of any acute changes in the wounds or any new areas of concern.  Doyal Polite, RN, MSN, [facility] WOC Team [phone] (Available Mon-Fri 0700-1500)

## 2023-11-05 NOTE — Plan of Care (Signed)
 Problem: Education: Goal: Knowledge of General Education information will improve Description: Including pain rating scale, medication(s)/side effects and non-pharmacologic comfort measures 11/05/2023 0335 by Vinie Ginny PARAS, RN Outcome: Progressing 11/05/2023 0334 by Vinie Ginny PARAS, RN Outcome: Not Progressing   Problem: Clinical Measurements: Goal: Ability to maintain clinical measurements within normal limits will improve 11/05/2023 0335 by Vinie Ginny PARAS, RN Outcome: Progressing 11/05/2023 0334 by Vinie Ginny PARAS, RN Outcome: Not Progressing Goal: Will remain free from infection 11/05/2023 0335 by Vinie Ginny PARAS, RN Outcome: Progressing 11/05/2023 0334 by Vinie Ginny PARAS, RN Outcome: Not Progressing Goal: Diagnostic test results will improve 11/05/2023 0335 by Vinie Ginny PARAS, RN Outcome: Progressing 11/05/2023 0334 by Vinie Ginny PARAS, RN Outcome: Not Progressing Goal: Respiratory complications will improve 11/05/2023 0335 by Vinie Ginny PARAS, RN Outcome: Progressing 11/05/2023 0334 by Vinie Ginny PARAS, RN Outcome: Not Progressing Goal: Cardiovascular complication will be avoided 11/05/2023 0335 by Vinie Ginny PARAS, RN Outcome: Progressing 11/05/2023 0334 by Vinie Ginny PARAS, RN Outcome: Not Progressing   Problem: Nutrition: Goal: Adequate nutrition will be maintained 11/05/2023 0335 by Vinie Ginny PARAS, RN Outcome: Progressing 11/05/2023 0334 by Vinie Ginny PARAS, RN Outcome: Not Progressing   Problem: Elimination: Goal: Will not experience complications related to bowel motility 11/05/2023 0335 by Vinie Ginny PARAS, RN Outcome: Progressing 11/05/2023 0334 by Vinie Ginny PARAS, RN Outcome: Not Progressing Goal: Will not experience complications related to urinary retention 11/05/2023 0335 by Vinie Ginny PARAS, RN Outcome: Progressing 11/05/2023 0334 by Vinie Ginny PARAS, RN Outcome: Not Progressing   Problem: Safety: Goal: Ability to remain free from injury will  improve 11/05/2023 0335 by Vinie Ginny PARAS, RN Outcome: Progressing 11/05/2023 0334 by Vinie Ginny PARAS, RN Outcome: Not Progressing   Problem: Education: Goal: Ability to describe self-care measures that may prevent or decrease complications (Diabetes Survival Skills Education) will improve 11/05/2023 0335 by Vinie Ginny PARAS, RN Outcome: Progressing 11/05/2023 0334 by Vinie Ginny PARAS, RN Outcome: Not Progressing Goal: Individualized Educational Video(s) 11/05/2023 0335 by Vinie Ginny PARAS, RN Outcome: Progressing 11/05/2023 0334 by Vinie Ginny PARAS, RN Outcome: Not Progressing   Problem: Fluid Volume: Goal: Ability to maintain a balanced intake and output will improve 11/05/2023 0335 by Vinie Ginny PARAS, RN Outcome: Progressing 11/05/2023 0334 by Vinie Ginny PARAS, RN Outcome: Not Progressing   Problem: Health Behavior/Discharge Planning: Goal: Ability to identify and utilize available resources and services will improve 11/05/2023 0335 by Vinie Ginny PARAS, RN Outcome: Progressing 11/05/2023 0334 by Vinie Ginny PARAS, RN Outcome: Not Progressing   Problem: Metabolic: Goal: Ability to maintain appropriate glucose levels will improve 11/05/2023 0335 by Vinie Ginny PARAS, RN Outcome: Progressing 11/05/2023 0334 by Vinie Ginny PARAS, RN Outcome: Not Progressing   Problem: Nutritional: Goal: Maintenance of adequate nutrition will improve 11/05/2023 0335 by Vinie Ginny PARAS, RN Outcome: Progressing 11/05/2023 0334 by Vinie Ginny PARAS, RN Outcome: Not Progressing   Problem: Skin Integrity: Goal: Risk for impaired skin integrity will decrease 11/05/2023 0335 by Vinie Ginny PARAS, RN Outcome: Progressing 11/05/2023 0334 by Vinie Ginny PARAS, RN Outcome: Not Progressing   Problem: Tissue Perfusion: Goal: Adequacy of tissue perfusion will improve 11/05/2023 0335 by Vinie Ginny PARAS, RN Outcome: Progressing 11/05/2023 0334 by Vinie Ginny PARAS, RN Outcome: Not Progressing   Problem:  Education: Goal: Knowledge of General Education information will improve Description: Including pain rating scale, medication(s)/side effects and non-pharmacologic comfort measures 11/05/2023 0335 by Vinie Ginny PARAS, RN Outcome: Progressing 11/05/2023 0334 by Vinie Ginny PARAS, RN Outcome: Not Progressing  Problem: Clinical Measurements: Goal: Ability to maintain clinical measurements within normal limits will improve 11/05/2023 0335 by Vinie Ginny PARAS, RN Outcome: Progressing 11/05/2023 0334 by Vinie Ginny PARAS, RN Outcome: Not Progressing Goal: Will remain free from infection 11/05/2023 0335 by Vinie Ginny PARAS, RN Outcome: Progressing 11/05/2023 0334 by Vinie Ginny PARAS, RN Outcome: Not Progressing Goal: Diagnostic test results will improve 11/05/2023 0335 by Vinie Ginny PARAS, RN Outcome: Progressing 11/05/2023 0334 by Vinie Ginny PARAS, RN Outcome: Not Progressing Goal: Respiratory complications will improve 11/05/2023 0335 by Vinie Ginny PARAS, RN Outcome: Progressing 11/05/2023 0334 by Vinie Ginny PARAS, RN Outcome: Not Progressing Goal: Cardiovascular complication will be avoided 11/05/2023 0335 by Vinie Ginny PARAS, RN Outcome: Progressing 11/05/2023 0334 by Vinie Ginny PARAS, RN Outcome: Not Progressing   Problem: Nutrition: Goal: Adequate nutrition will be maintained 11/05/2023 0335 by Vinie Ginny PARAS, RN Outcome: Progressing 11/05/2023 0334 by Vinie Ginny PARAS, RN Outcome: Not Progressing   Problem: Elimination: Goal: Will not experience complications related to bowel motility 11/05/2023 0335 by Vinie Ginny PARAS, RN Outcome: Progressing 11/05/2023 0334 by Vinie Ginny PARAS, RN Outcome: Not Progressing Goal: Will not experience complications related to urinary retention 11/05/2023 0335 by Vinie Ginny PARAS, RN Outcome: Progressing 11/05/2023 0334 by Vinie Ginny PARAS, RN Outcome: Not Progressing   Problem: Safety: Goal: Ability to remain free from injury will  improve 11/05/2023 0335 by Vinie Ginny PARAS, RN Outcome: Progressing 11/05/2023 0334 by Vinie Ginny PARAS, RN Outcome: Not Progressing

## 2023-11-05 NOTE — TOC Progression Note (Signed)
 Transition of Care (TOC) - Progression Note    Patient Details  Name: Frank [name]. MRN: [id number] Date of Birth: 1965-03-28  Transition of Care [facility]) CM/SW Contact  Lauraine FORBES Saa, LCSWA Phone Number: 11/05/2023, 3:58 PM  Clinical Narrative:     3:58 PM CSW submitted SNF insurance authorization request for [facility] as they have a bed availability Saturday and have approved patient's referral. Patient's SNF insurance authorization is currently pending (ID [id number]).  Expected Discharge Plan: Skilled Nursing Facility Barriers to Discharge: Continued Medical Work up, English as a second language teacher               Expected Discharge Plan and Services In-house Referral: Clinical Social Work Discharge Planning Services: [employer] Consult Post Acute Care Choice: Skilled Nursing Facility Living arrangements for the past 2 months: Single Family Home                 DME Arranged: N/A DME Agency: NA       HH Arranged: NA HH Agency: NA         Social Drivers of Health (SDOH) Interventions SDOH Screenings   Food Insecurity: No Food Insecurity (07/05/2023)  Housing: Low Risk  (07/05/2023)  Transportation Needs: No Transportation Needs (07/05/2023)  Utilities: Not At Risk (07/05/2023)  Tobacco Use: High Risk (10/13/2023)    Readmission Risk Interventions     No data to display

## 2023-11-05 NOTE — Progress Notes (Signed)
 Physical Therapy Treatment Patient Details Name: Frank [name]. MRN: [id number] DOB: [birth year] Today's Date: 11/05/2023   History of Present Illness 58 y.o. male transferred from [facility] 07/03/23 to [facility] for management of newly diagnosed plasma cell myeloma with weakness and AMS. Pt also with AKI and metabolic encephalopathy. 5/10 transfer to [facility] for iHD. 4/22 Intubated and chemo initiated. 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 5/6 IVIG started. 5/8-6/11 trach. 5/12 iHD initiated. 5/29 PEG placed. 6/5 chemo initiated. 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 With encouragement pt is agreeable to get to EoB to eat his lunch. Pt requiring min A for managing trunk to upright with supine to sit. Pt able to sit ~20 mins while eating his lunch. Pt demonstrates increased dexterity and abilities with using only his R UE. Afterward pt requests transfer from bed to [facility]. Pt requiring maxAx2 with Stedy to perform transfer. OT in room to assist NT with transfer back to bed.     If plan is discharge home, recommend the following: Two people to help with walking and/or transfers;Assistance with cooking/housework;Assist for transportation;Two people to help with bathing/dressing/bathroom;Direct supervision/assist for medications management;Direct supervision/assist for financial management;Help with stairs or ramp for entrance;Assistance with feeding   Can travel by private vehicle     No  Equipment Recommendations  Wheelchair (measurements PT);Wheelchair cushion (measurements PT)       Precautions / Restrictions Precautions Precautions: Fall Recall of Precautions/Restrictions: Intact Restrictions Weight Bearing Restrictions Per Provider Order: Yes LUE Weight Bearing Per Provider  Order: Non weight bearing Other Position/Activity Restrictions: ROM to tolerance     Mobility  Bed Mobility Overal bed mobility: Needs Assistance Bed Mobility: Supine to Sit     Supine to sit: Contact guard, Min assist     General bed mobility comments: increased time and effort with use of bedrail pt is able to bring LE off bed light min A for bringing trunk to upright    Transfers Overall transfer level: Needs assistance Equipment used: Ambulation equipment used Transfers: Sit to/from Stand, Bed to chair/wheelchair/BSC Sit to Stand: Max assist, +2 physical assistance           General transfer comment: Stedy utilized for transfer to/from [facility] Transfer via Lift Equipment: [employer] Overall balance assessment: Needs assistance Sitting-balance support: Feet supported, No upper extremity supported Sitting balance-Leahy Scale: Fair Sitting balance - Comments: able to maintain balance while eating lunch on EOB   Standing balance support: Single extremity supported Standing balance-Leahy Scale: Poor Standing balance comment: reliant no Stedy for support                            Communication Communication Communication: Impaired Factors Affecting Communication: Reduced clarity of speech  Cognition Arousal: Alert Behavior During Therapy: WFL for tasks assessed/performed                             Following commands: Impaired Following commands impaired: Follows one step commands with increased time    Cueing Cueing Techniques: Verbal cues, Tactile cues, Visual cues     General Comments General comments (skin integrity, edema, etc.): pt was able to sit up  for eating lunch      Pertinent Vitals/Pain Pain Assessment Pain Assessment: Faces Faces Pain Scale: Hurts even more Pain Location: BLEs and LUE amputation Pain Descriptors / Indicators: Aching, Discomfort, Guarding, Moaning Pain Intervention(s): Limited activity within  patient's tolerance, Monitored during session, Repositioned     PT Goals (current goals can now be found in the care plan section) Acute Rehab PT Goals PT Goal Formulation: With patient Time For Goal Achievement: 11/06/23 Potential to Achieve Goals: Fair Progress towards PT goals: Progressing toward goals    Frequency    Min 5X/week       Co-evaluation PT/OT/SLP Co-Evaluation/Treatment: Yes Reason for Co-Treatment: For patient/therapist safety;To address functional/ADL transfers PT goals addressed during session: Mobility/safety with mobility;Balance OT goals addressed during session: ADL's and self-care;Strengthening/ROM      AM-PAC PT 6 Clicks Mobility   Outcome Measure  Help needed turning from your back to your side while in a flat bed without using bedrails?: A Little Help needed moving from lying on your back to sitting on the side of a flat bed without using bedrails?: A Lot Help needed moving to and from a bed to a chair (including a wheelchair)?: Total Help needed standing up from a chair using your arms (e.g., wheelchair or bedside chair)?: Total Help needed to walk in hospital room?: Total Help needed climbing 3-5 steps with a railing? : Total 6 Click Score: 9    End of Session   Activity Tolerance: Patient limited by fatigue Patient left: Other (comment) (left pt on BSC with OT in room) Nurse Communication: Mobility status PT Visit Diagnosis: Other abnormalities of gait and mobility (R26.89);Muscle weakness (generalized) (M62.81);Difficulty in walking, not elsewhere classified (R26.2)     Time: 8783-8680 PT Time Calculation (min) (ACUTE ONLY): 63 min  Charges:    $Therapeutic Activity: 23-37 mins PT General Charges $$ ACUTE PT VISIT: 1 Visit                     France Lusty B. Fleeta Lapidus PT, DPT Acute Rehabilitation Services Please use secure chat or  Call Office [phone]    [name] [facility] 11/05/2023, 3:33 PM

## 2023-11-05 NOTE — Progress Notes (Signed)
 Occupational Therapy Treatment Patient Details Name: Frank [name]. MRN: [id number] DOB: [birth year] Today's Date: 11/05/2023   History of present illness 58 y.o. male transferred from [facility] 07/03/23 to [facility] for management of newly diagnosed plasma cell myeloma with weakness and AMS. Pt also with AKI and metabolic encephalopathy. 5/10 transfer to [facility] for iHD. 4/22 Intubated and chemo initiated. 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 5/6 IVIG started. 5/8-6/11 trach. 5/12 iHD initiated. 5/29 PEG placed. 6/5 chemo initiated. 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 Patient able to feeds self seated on EOB with no assistance for balance and cues for compensation techniques with one extremity. Patient asking to use BSC and required max assist +2 for sit to stand into [place] and to lower to [facility].  Patient required increased time to use bathroom and total assist for hygiene. Patient completed treatment in recliner. Patient will benefit from continued inpatient follow up therapy, <3 hours/day.  Acute OT to continue to follow to address established goals to facilitate DC to next venue of care.        If plan is discharge home, recommend the following:  Two people to help with walking and/or transfers;Assistance with cooking/housework;Assistance with feeding;Direct supervision/assist for medications management;Direct supervision/assist for financial management;Assist for transportation;Help with stairs or ramp for entrance;A lot of help with bathing/dressing/bathroom   Equipment Recommendations  Other (comment) (TBD)    Recommendations for Other Services      Precautions / Restrictions Precautions Precautions: Fall Recall of Precautions/Restrictions: Intact Restrictions Weight Bearing Restrictions Per Provider Order:  Yes LUE Weight Bearing Per Provider Order: Non weight bearing Other Position/Activity Restrictions: ROM to tolerance       Mobility Bed Mobility Overal bed mobility: Needs Assistance             General bed mobility comments: seated on EOB with PT upon entry    Transfers Overall transfer level: Needs assistance Equipment used: Ambulation equipment used Transfers: Sit to/from Stand, Bed to chair/wheelchair/BSC Sit to Stand: Max assist, +2 physical assistance           General transfer comment: Stedy utilized for transfer to/from [facility] Transfer via Lift Equipment: [employer] Overall balance assessment: Needs assistance Sitting-balance support: Feet supported, No upper extremity supported Sitting balance-Leahy Scale: Fair Sitting balance - Comments: able to maintain balance while eating lunch on EOB   Standing balance support: Single extremity supported Standing balance-Leahy Scale: Poor Standing balance comment: reliant no Stedy for support                           ADL either performed or assessed with clinical judgement   ADL Overall ADL's : Needs assistance/impaired Eating/Feeding: Supervision/ safety;Sitting Eating/Feeding Details (indicate cue type and reason): on EOB with cues for compensation technques                     Toilet Transfer: Maximal assistance;+2 for physical assistance;BSC/3in1 Toilet Transfer Details (indicate cue type and reason): transfer to [facility] with [facility] Toileting- Clothing Manipulation and Hygiene: Total assistance;Sitting/lateral lean;Sit to/from stand Toileting - Clothing Manipulation Details (indicate cue type and reason): total assist for toilet hygiene while seated on [facility]            Extremity/Trunk Assessment  Vision       Perception     Praxis     Communication Communication Communication: Impaired Factors Affecting Communication: Reduced clarity of speech   Cognition Arousal:  Alert Behavior During Therapy: WFL for tasks assessed/performed Cognition: Cognition impaired                               Following commands: Impaired Following commands impaired: Follows one step commands with increased time      Cueing   Cueing Techniques: Verbal cues, Tactile cues, Visual cues  Exercises      Shoulder Instructions       General Comments      Pertinent Vitals/ Pain       Pain Assessment Pain Assessment: Faces Faces Pain Scale: Hurts even more Pain Location: BLEs and LUE amputation Pain Descriptors / Indicators: Aching, Discomfort, Guarding, Moaning Pain Intervention(s): Limited activity within patient's tolerance, Monitored during session, Repositioned  Home Living                                          Prior Functioning/Environment              Frequency  Min 1X/week (on STAR Program)        Progress Toward Goals  OT Goals(current goals can now be found in the care plan section)  Progress towards OT goals: Progressing toward goals  Acute Rehab OT Goals Patient Stated Goal: get stronger OT Goal Formulation: With patient Time For Goal Achievement: 11/12/23 Potential to Achieve Goals: Fair ADL Goals Pt Will Perform Grooming: sitting;with set-up (A/E PRN) Pt Will Perform Upper Body Dressing: sitting;with contact guard assist;with adaptive equipment Pt Will Perform Lower Body Dressing: sitting/lateral leans;sit to/from stand;with mod assist (using one-handed RUE techniques) Pt Will Transfer to Toilet: with contact guard assist;bedside commode;with transfer board Pt/caregiver will Perform Home Exercise Program: Increased ROM;Left upper extremity;With written HEP provided;Independently Additional ADL Goal #1: Pt will complete bed mobility with min A as a precursor to EOB ADLs Additional ADL Goal #2: Pt will demonstrate ability to stand statically for >3 mins to demonstrate improving tolerance in prep for  OOB mobility Additional ADL Goal #3: Pt will demonstrate independence with sensory stimulation to LUE to improve phantom symptoms.  Plan      Co-evaluation    PT/OT/SLP Co-Evaluation/Treatment: Yes Reason for Co-Treatment: For patient/therapist safety;To address functional/ADL transfers PT goals addressed during session: Mobility/safety with mobility;Balance OT goals addressed during session: ADL's and self-care;Strengthening/ROM      AM-PAC OT 6 Clicks Daily Activity     Outcome Measure   Help from another person eating meals?: A Little Help from another person taking care of personal grooming?: A Little Help from another person toileting, which includes using toliet, bedpan, or urinal?: Total Help from another person bathing (including washing, rinsing, drying)?: A Lot Help from another person to put on and taking off regular upper body clothing?: A Little Help from another person to put on and taking off regular lower body clothing?: A Lot 6 Click Score: 14    End of Session Equipment Utilized During Treatment: Gait belt;Other (comment) Laurent)  OT Visit Diagnosis: Unsteadiness on feet (R26.81);Other abnormalities of gait and mobility (R26.89);Muscle weakness (generalized) (M62.81);Pain Pain - Right/Left: Left Pain - part of body: Leg;Shoulder (and RLE)   Activity Tolerance Patient tolerated  treatment well   Patient Left in chair;with call bell/phone within reach;with chair alarm set   Nurse Communication Mobility status;Need for lift equipment        Time: 1145-1331 OT Time Calculation (min): 106 min  Charges: OT General Charges $OT Visit: 1 Visit OT Treatments $Self Care/Home Management : 38-52 mins  [name], OTA Acute Rehabilitation Services  Office [phone]   [name] 11/05/2023, 2:58 PM

## 2023-11-05 NOTE — Progress Notes (Signed)
 Progress Note   Patient: Frank [name]. FMW:[id number] DOB: [birth year] DOA: 07/03/2023  DOS: the patient was seen and examined on 11/05/2023   Brief hospital course:  58 year old PMH OSA CPAP, pathological fracture and multiple ORIF left Humerus likely due to multiple myeloma, A-fib not on anticoagulation, hyperlipidemia, tobacco use disorder who was feeling weak for 1 week, admitted to [facility] with concern of sepsis, community-acquired pneumonia and acute encephalopathy.   Trasfered to [facility] initially and [name] saw the patient He stayed in the ICU for 36 days underwent trach also HD/CRRT 4/17: Admitted to medical floor with oncology consultation to treat multiple myeloma 4/19: Worsening respiratory status/encephalopathy, transferred to ICU -> intubated  4/20: A-fib with RVR. Started heparin . Developed groin and biopsy site hematoma requiring multiple transfusions 4/22: Oncology started Velcade  and Cytoxan   5/2: LP performed  5/8: Trach placed.  5/29: PEG placed 6/5: Last dose Kyprolis /Cytoxan , first dose Sarclisa  6/11: Trach removed. 6/13: TDC removed for line holiday 6/16: New HD catheter placed 6/23: removed HD catheter  6/28: first cycle of chemo 7/11: IR drain placed right hip for infected hematoma  7/17: bilateral lower extremity DVTs 7/28: amputation of left arm through humerus on 7/28 7/30: port-a-cath placed 8/7: IR perform sclerotherapy of the infected hematoma-second attempt 8/14: imaging right hip suggestive right buttock pigtail catheter with fluid collection 11 x 5 cm, collection anterior right thigh incompletely evacuated 8 x 6 cm-appears relatively stable 8/15: DC right gluteal IR drain. 8/19: 3rd dose Chemo given--now await SNF in [place]   Now most issues resolved and awaiting placement in [place].     Assessment and Plan:   Multiple myeloma - Port-A-Cath placed 7/30 by IR.  Started chemo with Velcade /Cytoxan  on 08/21/2023. Chemo changed to  Sarclisa /Kyprolis /Cytoxan  later on.  Managed closely by oncology.  Oncology and TOC to work on setting up outpatient chemotherapy in [place] upon discharge.  EPO per oncology today.    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  Wound VAC removed 8/1.  Dressing changes, Eliquis  per Ortho.  wash LUE incision daily with soap and water . Change dressing daily with gauze, kerlix, ace wrap. Ok to leave open to air if no drainage.  Oxy IR 10-15 q3 , gabapentin  200 at bedtime can use Dilaudid  0.5-1 every 4 as needed severe pain, continues Flexeril  5 3 times daily, gabapentin  200 at bedtime.    Infected right flank/gluteal hematoma - S/p IR drain placement 7/11.  Drain exchanged 7/25.  Completed antibiotic course 7/26.  Drain removed 8/15.     Acute kidney injury on CKD 3A - Briefly required CRRT.  AKI resolved, TDC removed.  Creatinine mildly elevated this morning.  Possibly bit prerenal, decreased p.o. intake.  Encourage p.o. hydration.  Will repeat bmp in am.   Bilateral lower extremity DVT - Eliquis  on board.  Pt will be on life long anticoagulation.   Anemia of chronic renal disease - Hemoglobin stable.  EPO per oncology   Insulin -dependent diabetes mellitus - Continue insulin  sliding scale.   Paroxysmal atrial fibrillation - Rate well-controlled.  Continue verapamil , Eliquis .   Obstructive sleep apnea - Pt unable to tolerate CPAP mask.     Sacral Decubiti -turn q 2hr.   Goals of care -Working closely with TOC, pt, on disposition planning.  Looking SNF, bed is nearly available and auth approved.  Anticipate DC Saturday.    Resolved problems: Sepsis with  staph epi bactermia Dysphagia Aspergillosis with PNA    Subjective: Patient feeling well this morning.  Denies any fever, chills, chest pain, nausea, vomiting, abdominal pain.  Eager for discharge this coming Saturday.  Still a bit weak today,  chemo yesterday.  Physical Exam:  Vitals:   11/05/23 0036 11/05/23 0356 11/05/23 0738 11/05/23 0742  BP:  116/80  101/74  Pulse:  97  97  Resp:  16    Temp:  98.4 F (36.9 C)  98.3 F (36.8 C)  TempSrc:  Oral  Oral  SpO2: 98% 91% 92% 95%  Weight:      Height:        GENERAL:  Alert, pleasant, no acute distress  HEENT:  EOMI CARDIOVASCULAR:  RRR, no murmurs appreciated RESPIRATORY:  Clear to auscultation, no wheezing, rales, or rhonchi GASTROINTESTINAL:  Soft, nontender, nondistended EXTREMITIES: L UE above elbow amputation, drain in right hip NEURO:  No new focal deficits appreciated SKIN:  No rashes noted PSYCH:  Appropriate mood and affect    Data Reviewed:  Imaging Studies: CT ABDOMEN PELVIS W CONTRAST Result Date: 10/31/2023 CLINICAL DATA:  Follow-up drainage catheter EXAM: CT ABDOMEN AND PELVIS WITH CONTRAST TECHNIQUE: Multidetector CT imaging of the abdomen and pelvis was performed using the standard protocol following bolus administration of intravenous contrast. RADIATION DOSE REDUCTION: This exam was performed according to the departmental dose-optimization program which includes automated exposure control, adjustment of the mA and/or kV according to patient size and/or use of iterative reconstruction technique. CONTRAST:  OMNIPAQUE  IOHEXOL  350 MG/ML SOLN COMPARISON:  10/07/2023 FINDINGS: Lower chest: Patchy bibasilar atelectasis is noted worse on the right than the left. Hepatobiliary: No focal liver abnormality is seen. No gallstones, gallbladder wall thickening, or biliary dilatation. Pancreas: Unremarkable. No pancreatic ductal dilatation or surrounding inflammatory changes. Spleen: Normal in size without focal abnormality. Adrenals/Urinary Tract: Adrenal glands are within normal limits. Kidneys demonstrate scattered cysts bilaterally. No follow-up is recommended. No renal calculi or obstructive changes are seen. The bladder is partially distended. Stomach/Bowel:  Scattered fecal material is noted throughout the colon without obstructive change. The appendix is within normal limits. Small bowel and stomach are within normal limits. Vascular/Lymphatic: Aortic atherosclerosis. No enlarged abdominal or pelvic lymph nodes. Reproductive: Prostate is unremarkable. Other: No abdominal wall hernia or abnormality. No abdominopelvic ascites. Musculoskeletal: Left hip replacement is noted. Degenerative changes of lumbar spine are seen. Chronic T11 compression fracture is noted. In the right buttock a pigtail drainage catheter is identified. There remains a fluid collection which measures approximately 11 by 5 cm in greatest transverse and AP dimensions respectively. The collection in the anterior right thigh is incompletely evaluated on this exam but measures at least 8.7 by 2.6 cm in transverse and AP dimensions respectively. This is slightly smaller seen on the prior study. IMPRESSION: Fluid collections in the anterior right thigh and right buttock. Drainage catheter remains in the right buttock. The overall appearance is relatively stable. The anterior right thigh collection is slightly smaller than that seen on the prior exam. Patchy atelectatic changes in the bases right greater than left. Electronically Signed   By: Oneil Devonshire M.D.   On: 10/31/2023 03:27   CT HUMERUS LEFT W CONTRAST Result Date: 10/27/2023 CLINICAL DATA:  Checking for cellulitis. An area of redness. History of multiple myeloma and left upper arm amputation EXAM: CT OF THE UPPER LEFT EXTREMITY WITH CONTRAST TECHNIQUE: Multidetector CT imaging of the left upper arm was performed according to the standard protocol following  intravenous contrast administration. RADIATION DOSE REDUCTION: This exam was performed according to the departmental dose-optimization program which includes automated exposure control, adjustment of the mA and/or kV according to patient size and/or use of iterative reconstruction technique.  CONTRAST:  OMNIPAQUE  IOHEXOL  350 MG/ML SOLN COMPARISON:  Intraoperative images 10/13/2023, left humeral radiographs 09/15/2023 and CT of the left elbow 07/01/2022. Chest CT 07/26/2023 FINDINGS: Bones/Joint/Cartilage In correlation with prior imaging, patient underwent distal humeral hardware removal and amputation through the mid humeral shaft 10/13/2023. Grossly stable postsurgical changes within the remaining distal humeral shaft from hardware removal with associated chronic periosteal thickening. No gross bone destruction, acute fracture or dislocation identified. Advanced glenohumeral degenerative changes with joint space narrowing, osteophytes and subchondral cyst formation in the glenoid and humeral head. Some of the cystic changes are removed from the subchondral bone and are likely secondary to the patient's multiple myeloma. Additional lytic lesions noted in the tip of the left scapula and left ribs. Small shoulder joint effusion with an ossified loose body in the axillary recess. Mild acromioclavicular degenerative changes. Ligaments Suboptimally assessed by CT. Muscles and Tendons Mild muscular atrophy. No intramuscular fluid collection or suspicious enhancement identified. Soft tissues Postsurgical changes related to recent amputation. Skin thickening along the amputation. No evidence of organized fluid collection, soft tissue emphysema or unexpected foreign body. There are multiple small solid nodules in the left lung, measuring up to 6 mm in the apex (image 50/4) and 7 mm in the lower lobe (image 113/4). Previous chest CT demonstrated widespread ground-glass opacities throughout both lungs which may have obscured these nodules. IMPRESSION: 1. Postsurgical changes related to recent amputation through the mid humeral shaft. No evidence of abscess or soft tissue emphysema. 2. No acute osseous findings. Grossly stable postsurgical changes in the distal humerus and probable sequela of multiple  myeloma. No specific evidence of osteomyelitis. 3. Advanced glenohumeral degenerative changes with small shoulder joint effusion and ossified loose body in the axillary recess. 4. Multiple small solid nodules in the left lung, measuring up to 7 mm in the lower lobe. Previous chest CT demonstrated widespread ground-glass opacities throughout both lungs which may have obscured these nodules. These findings are nonspecific and potentially postinflammatory. Metastatic disease considered less likely, although not completely excluded. Suggest follow-up chest CT due to incomplete visualization. Electronically Signed   By: Elsie Perone M.D.   On: 10/27/2023 09:46   IR SCLEROTHERAPY OF A FLUID COLLECTION Result Date: 10/23/2023 INDICATION: Right hip fluid collection continued. Previous doxycycline  sclerosis performed 10/10/2023. Output still persisting although diminished. Planned second and final attempted sclerosing fluid collection. EXAM: Drain change MEDICATIONS: The patient is currently admitted to the hospital and receiving intravenous antibiotics. The antibiotics were administered within an appropriate time frame prior to the initiation of the procedure. ANESTHESIA/SEDATION: Moderate (conscious) sedation was employed during this procedure. A total of Versed  2 mg and Fentanyl  150 mcg was administered intravenously by the radiology nurse. Total intra-service moderate Sedation Time: 25 mL Omnipaque  3 minutes. The patient's level of consciousness and vital signs were monitored continuously by radiology nursing throughout the procedure under my direct supervision. COMPLICATIONS: None immediate. PROCEDURE: Informed written consent was obtained from the patient after a thorough discussion of the procedural risks, benefits and alternatives. All questions were addressed. Maximal Sterile Barrier Technique was utilized including caps, mask, sterile gowns, sterile gloves, sterile drape, hand hygiene and skin antiseptic. A  timeout was performed prior to the initiation of the procedure. With the patient in a supine position, external  portion of drainage in the right flank were prepped and draped in the usual sterile. Aggressive flushing was normal saline. Significant decrease in overall size the when to previous. When all of flushing began to return mostly clear rest of pocket was evacuated. Short Amplatz wire was then advanced through the catheter and the catheter was completely patient. New 10 catheter was then advanced over the guidewire coiled more medially. Approximately 40 mL of reconstituted doxycycline  was then injected into the catheter and the catheter was capped. Retention suture and sterile dressing applied. The catheter will be reopened to suction approximately 2 hours. That time there will be no more flushing of the catheter. IMPRESSION: Repeat sclerosing right flank cavity using combination fluoroscopy and aggressive flushing with doxycycline . Electronically Signed   By: Cordella Banner   On: 10/23/2023 16:23   IR Catheter Tube Change Result Date: 10/23/2023 INDICATION: Right hip fluid collection continued. Previous doxycycline  sclerosis performed 10/10/2023. Output still persisting although diminished. Planned second and final attempted sclerosing fluid collection. EXAM: Drain change MEDICATIONS: The patient is currently admitted to the hospital and receiving intravenous antibiotics. The antibiotics were administered within an appropriate time frame prior to the initiation of the procedure. ANESTHESIA/SEDATION: Moderate (conscious) sedation was employed during this procedure. A total of Versed  2 mg and Fentanyl  150 mcg was administered intravenously by the radiology nurse. Total intra-service moderate Sedation Time: 25 mL Omnipaque  3 minutes. The patient's level of consciousness and vital signs were monitored continuously by radiology nursing throughout the procedure under my direct supervision. COMPLICATIONS: None  immediate. PROCEDURE: Informed written consent was obtained from the patient after a thorough discussion of the procedural risks, benefits and alternatives. All questions were addressed. Maximal Sterile Barrier Technique was utilized including caps, mask, sterile gowns, sterile gloves, sterile drape, hand hygiene and skin antiseptic. A timeout was performed prior to the initiation of the procedure. With the patient in a supine position, external portion of drainage in the right flank were prepped and draped in the usual sterile. Aggressive flushing was normal saline. Significant decrease in overall size the when to previous. When all of flushing began to return mostly clear rest of pocket was evacuated. Short Amplatz wire was then advanced through the catheter and the catheter was completely patient. New 10 catheter was then advanced over the guidewire coiled more medially. Approximately 40 mL of reconstituted doxycycline  was then injected into the catheter and the catheter was capped. Retention suture and sterile dressing applied. The catheter will be reopened to suction approximately 2 hours. That time there will be no more flushing of the catheter. IMPRESSION: Repeat sclerosing right flank cavity using combination fluoroscopy and aggressive flushing with doxycycline . Electronically Signed   By: Cordella Banner   On: 10/23/2023 16:23   IR IMAGING GUIDED PORT INSERTION Result Date: 10/15/2023 INDICATION: 58 year old male with history multiple myeloma requiring central venous access for chemotherapy administration EXAM: IMPLANTED PORT A CATH PLACEMENT WITH ULTRASOUND AND FLUOROSCOPIC GUIDANCE COMPARISON:  None Available. MEDICATIONS: None. ANESTHESIA/SEDATION: Moderate (conscious) sedation was employed during this procedure. A total of Versed  1.5 mg and Fentanyl  75 mcg was administered intravenously. Moderate Sedation Time: 14 minutes. The patient's level of consciousness and vital signs were monitored  continuously by radiology nursing throughout the procedure under my direct supervision. CONTRAST:  None FLUOROSCOPY TIME:  Ten mGy reference air kerma COMPLICATIONS: None immediate. PROCEDURE: The procedure, risks, benefits, and alternatives were explained to the patient. Questions regarding the procedure were encouraged and answered. The patient understands and consents  to the procedure. The right neck and chest were prepped with chlorhexidine  in a sterile fashion, and a sterile drape was applied covering the operative field. Maximum barrier sterile technique with sterile gowns and gloves were used for the procedure. A timeout was performed prior to the initiation of the procedure. Ultrasound was used to examine the jugular vein which was compressible and free of internal echoes. A skin marker was used to demarcate the planned venotomy and port pocket incision sites. Local anesthesia was provided to these sites and the subcutaneous tunnel track with 1% lidocaine  with 1:100,000 epinephrine . A small incision was created at the jugular access site and blunt dissection was performed of the subcutaneous tissues. Under ultrasound guidance, the jugular vein was accessed with a 21 ga micropuncture needle and an 0.018 wire was inserted to the superior vena cava. Real-time ultrasound guidance was utilized for vascular access including the acquisition of a permanent ultrasound image documenting patency of the accessed vessel. A 5 Fr micopuncture set was then used, through which a 0.035 Rosen wire was passed under fluoroscopic guidance into the inferior vena cava. An 8 Fr dilator was then placed over the wire. A subcutaneous port pocket was then created along the upper chest wall utilizing a combination of sharp and blunt dissection. The pocket was irrigated with sterile saline, packed with gauze, and observed for hemorrhage. A single lumen ISP sized power injectable port was chosen for placement. The 8 Fr catheter was  tunneled from the port pocket site to the venotomy incision. The port was placed in the pocket. The external catheter was trimmed to appropriate length. The dilator was exchanged for an 8 Fr peel-away sheath under fluoroscopic guidance. The catheter was then placed through the sheath and the sheath was removed. Final catheter positioning was confirmed and documented with a fluoroscopic spot radiograph. The port was accessed with a Huber needle, aspirated, and flushed with heparinized saline. The deep dermal layer of the port pocket incision was closed with interrupted 3-0 Vicryl suture. Dermabond was then placed over the port pocket and neck incisions. The patient tolerated the procedure well without immediate post procedural complication. FINDINGS: After catheter placement, the tip lies within the superior cavoatrial junction. The catheter aspirates and flushes normally and is ready for immediate use. IMPRESSION: Successful placement of a power injectable Port-A-Cath via the right internal jugular vein. The catheter is ready for immediate use. Ester Sides, MD [facility] Electronically Signed   By: Ester Sides M.D.   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 Multiple myeloma. EXAM: LEFT HUMERUS - 2+ VIEW COMPARISON:  09/15/2023 FINDINGS: A single C-arm image demonstrates the recently demonstrated lytic changes in the distal humerus and associated fixation hardware. IMPRESSION: Single C-arm image of the distal humerus and associated fixation hardware. Electronically Signed   By: Elspeth Bathe M.D.   On: 10/13/2023 11:05   DG C-Arm 1-60 Min-No Report Result Date: 10/13/2023 Fluoroscopy was utilized by the requesting physician.  No radiographic interpretation.   DG C-Arm 1-60 Min-No Report Result Date: 10/13/2023 Fluoroscopy was utilized by the requesting  physician.  No radiographic interpretation.   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 Planned sclerosing. EXAM: Sclerosing of abnormal fluid collection COMPARISON:  None Available. MEDICATIONS: 500 mg doxycycline ; intra procedural ANESTHESIA/SEDATION: Moderate (conscious) sedation was employed during this  procedure. A total of Versed  2 mg and Fentanyl  100 mcg was administered intravenously by the radiology nurse. Total intra-service moderate Sedation Time: 11 minutes. The patient's level of consciousness and vital signs were monitored continuously by radiology nursing throughout the procedure under my direct supervision. CONTRAST:  10 mL Omnipaque  300-administered into the collecting system(s) FLUOROSCOPY: Radiation Exposure Index (as provided by the fluoroscopic device): 5 mGy Kerma COMPLICATIONS: None immediate. PROCEDURE: Informed written consent was obtained from the patient after a thorough discussion of the procedural risks, benefits and alternatives. All questions were addressed. Maximal Sterile Barrier Technique was utilized including caps, mask, sterile gowns, sterile gloves, sterile drape, hand hygiene and skin antiseptic. A timeout was performed prior to the initiation of the procedure. The indwelling catheter in the right hip region prepped and draped in usual sterile fashion. The indwelling 10 French catheter was aggressively irrigated with normal saline and flushed. Dilute contrast was then injected under fluoroscopic guidance into the catheter to evaluate if the fluid collection was communicating with vascular or lymphatic structure. Contrast flows freely into the catheter and outlines the fluid collection which is irregular in appearance, however there is no vascular or lymphatic communication identified. The contrast was then mostly withdrawn. The catheter and retention sutures were cut. A  new 10 French catheter was then advanced over a guidewire into the fluid collection and repositioned in a more cephalad location. Approximately 500 mg of doxycycline  was reconstituted with 50 mL of normal saline and then approximately 35 mL of this mixture was injected into the 10 French pigtail catheter. The catheter was then capped. Retention suture and sterile dressing were applied. The doxycycline  will dwell within the cavity for approximately 1 hour and then the catheter will be connected to a JP bulb and the patient will be monitored for output. IMPRESSION: Satisfactory exchange of a 10 French right hip drainage catheter with sclerosing using doxycycline  as described above. Catheter will remain to a JP bulb until output has sufficiently diminished. Electronically Signed   By: Cordella Banner   On: 10/10/2023 17:18   CT PELVIS WO CONTRAST Result Date: 10/07/2023 CLINICAL DATA:  Right hip drain evaluation. EXAM: CT PELVIS WITHOUT CONTRAST TECHNIQUE: Multidetector CT imaging of the pelvis was performed following the standard protocol without intravenous contrast. RADIATION DOSE REDUCTION: This exam was performed according to the departmental dose-optimization program which includes automated exposure control, adjustment of the mA and/or kV according to patient size and/or use of iterative reconstruction technique. COMPARISON:  CT abdomen pelvis dated 07/08/2023. FINDINGS: Urinary Tract: The visualized ureters appear unremarkable. Punctate stone noted in the posterior bladder lumen. The urinary bladder is otherwise unremarkable. Bowel:  Moderate stool in the colon.  The appendix is normal. Vascular/Lymphatic: Moderate aortoiliac atherosclerotic disease. No pelvic adenopathy. Reproductive: The prostate and seminal vesicles are grossly unremarkable. Other: Right gluteal pigtail drainage catheter. Significant interval decrease in the size of the hematoma in the right gluteal muscle now measuring approximately 4.7  x 9.3 cm. Additional loculated collection in the anterior upper right thigh significantly decreased in size since the prior CT and measures approximately 10 x 17 cm in greatest axial dimensions. Musculoskeletal: Osteopenia with degenerative changes of the spine. Total left hip arthroplasty. Scattered lucent lesions throughout the bones as seen on the prior CT. IMPRESSION: 1. Right gluteal pigtail drainage catheter with significant interval decrease in the size of the hematoma in the right gluteal muscle. 2. Significant interval decrease in the size of the loculated collection in the anterior upper  right thigh. 3. Punctate stone in the posterior bladder lumen. 4.  Aortic Atherosclerosis (ICD10-I70.0). Electronically Signed   By: Vanetta Chou M.D.   On: 10/07/2023 16:53   DG CHEST PORT 1 VIEW Result Date: 10/06/2023 EXAM: 1 VIEW XRAY OF THE CHEST 10/06/2023 08:52:00 PM COMPARISON: 09/28/2023 CLINICAL HISTORY: [id number] SOB (shortness of breath) [id number]. sob FINDINGS: LUNGS AND PLEURA: Increased interstitial markings, left greater than right, favoring atypical infection / pneumonia over interstitial edema. No pleural effusion. No pneumothorax. HEART AND MEDIASTINUM: No acute abnormality of the cardiac and mediastinal silhouettes. BONES AND SOFT TISSUES: Degenerative changes of the bilateral shoulders. No acute osseous abnormality. IMPRESSION: 1. Increased interstitial markings, left greater than right, favoring atypical infection/pneumonia over interstitial edema. Electronically signed by: Pinkie Pebbles MD 10/06/2023 08:58 PM EDT RP Workstation: [id number]    There are no new results to review at this time.  Previous records (including but not limited to H&P, progress notes, nursing notes, TOC management) were reviewed in assessment of this patient.  Labs: CBC: Recent Labs  Lab 10/30/23 0400 10/31/23 0331 11/01/23 0351 11/04/23 0404 11/05/23 0324  WBC 4.0 3.4* 3.6* 3.7* 5.9  NEUTROABS  --   --    --  2.1 4.4  HGB 9.3* 8.9* 8.9* 9.2* 8.8*  HCT 29.7* 28.5* 28.1* 29.7* 27.8*  MCV 97.1 97.6 96.9 99.3 99.3  PLT 323 329 320 331 277   Basic Metabolic Panel: Recent Labs  Lab 11/01/23 0351 11/02/23 0410 11/03/23 0351 11/04/23 0404 11/05/23 0324  NA 135 138 137 135 135  K 4.5 4.6 4.5 4.1 3.9  CL 106 105 104 106 105  CO2 24 25 26 25 23   GLUCOSE 77 101* 101* 98 124*  BUN 50* 50* 51* 42* 40*  CREATININE 1.15 1.16 1.37* 1.28* 1.53*  CALCIUM  8.3* 8.5* 8.5* 8.4* 7.9*   Liver Function Tests: Recent Labs  Lab 10/30/23 0400 10/31/23 0331 11/01/23 0351 11/04/23 0404 11/05/23 0324  AST 8* 8* 9* 8* 9*  ALT 7 6 7 7 6   ALKPHOS 66 59 58 59 55  BILITOT 0.4 0.5 0.5 0.4 0.6  PROT 5.2* 4.8* 4.7* 4.9* 4.6*  ALBUMIN  2.1* 2.1* 2.2* 2.2* 2.0*   CBG: Recent Labs  Lab 11/04/23 0812 11/04/23 1213 11/04/23 1705 11/04/23 2131 11/05/23 0618  GLUCAP 159* 64* 180* 175* 109*    Scheduled Meds:  (feeding supplement) PROSource Plus  30 mL Oral TID BM   acyclovir   400 mg Oral BID   apixaban   5 mg Oral BID   collagenase    Topical Daily   docusate sodium   100 mg Oral BID   epoetin  alfa  40,000 Units Subcutaneous Q Wed-1800   feeding supplement  237 mL Oral BID WC   gabapentin   200 mg Oral QHS   guaiFENesin   600 mg Oral BID   heparin  lock flush  500 Units Intravenous Once   hydrALAZINE   25 mg Oral Q8H   insulin  aspart  0-15 Units Subcutaneous TID AC & HS   insulin  aspart  2 Units Subcutaneous TID AC & HS   insulin  glargine  10 Units Subcutaneous Daily   leptospermum manuka honey  1 Application Topical Daily   lidocaine   1 patch Transdermal Q24H   montelukast   5 mg Oral QHS   multivitamin with minerals  1 tablet Oral Daily   nutrition supplement (JUVEN)  1 packet Oral BID AC   [START ON 11/07/2023] ondansetron  (ZOFRAN ) IV  4 mg Intravenous Q8H   mouth rinse  15  mL Mouth Rinse 4 times per day   pantoprazole   40 mg Oral BID   PARoxetine   40 mg Oral Daily   senna  1 tablet Oral Daily    umeclidinium-vilanterol  1 puff Inhalation Daily   valproic  acid  750 mg Oral BID   verapamil   120 mg Oral Daily   Continuous Infusions:  sodium chloride  Stopped (10/13/23 0952)   sodium chloride  Stopped (08/28/23 0839)   sodium chloride      PRN Meds:.acetaminophen  (TYLENOL ) oral liquid 160 mg/5 mL **OR** acetaminophen , alum & mag hydroxide-simeth, benzonatate , cyclobenzaprine , HYDROmorphone  (DILAUDID ) injection, HYDROmorphone , hydrOXYzine , ipratropium-albuterol , polyethylene glycol, artificial tears, sodium chloride  flush  Family Communication: None at bedside  Disposition: Status is: Inpatient Remains inpatient appropriate because: Multiple myeloma     Time spent: 36 minutes  Length of inpatient stay: 125 days  Author: Carliss LELON Canales, DO 11/05/2023 11:25 AM  For on call review [email or web address].

## 2023-11-06 DIAGNOSIS — N179 Acute kidney failure, unspecified: Secondary | ICD-10-CM | POA: Diagnosis not present

## 2023-11-06 DIAGNOSIS — C9000 Multiple myeloma not having achieved remission: Secondary | ICD-10-CM | POA: Diagnosis not present

## 2023-11-06 DIAGNOSIS — I5032 Chronic diastolic (congestive) heart failure: Secondary | ICD-10-CM | POA: Diagnosis not present

## 2023-11-06 LAB — CBC WITH DIFFERENTIAL/PLATELET
Abs Immature Granulocytes: 0.07 K/uL (ref 0.00–0.07)
Basophils Absolute: 0 K/uL (ref 0.0–0.1)
Basophils Relative: 0 %
Eosinophils Absolute: 0 K/uL (ref 0.0–0.5)
Eosinophils Relative: 1 %
HCT: 30.4 % — ABNORMAL LOW (ref 39.0–52.0)
Hemoglobin: 9.5 g/dL — ABNORMAL LOW (ref 13.0–17.0)
Immature Granulocytes: 3 %
Lymphocytes Relative: 19 %
Lymphs Abs: 0.5 K/uL — ABNORMAL LOW (ref 0.7–4.0)
MCH: 30.8 pg (ref 26.0–34.0)
MCHC: 31.3 g/dL (ref 30.0–36.0)
MCV: 98.7 fL (ref 80.0–100.0)
Monocytes Absolute: 0.6 K/uL (ref 0.1–1.0)
Monocytes Relative: 24 %
Neutro Abs: 1.3 K/uL — ABNORMAL LOW (ref 1.7–7.7)
Neutrophils Relative %: 53 %
Platelets: 254 K/uL (ref 150–400)
RBC: 3.08 MIL/uL — ABNORMAL LOW (ref 4.22–5.81)
RDW: 21.1 % — ABNORMAL HIGH (ref 11.5–15.5)
WBC: 2.5 K/uL — ABNORMAL LOW (ref 4.0–10.5)
nRBC: 0 % (ref 0.0–0.2)

## 2023-11-06 LAB — COMPREHENSIVE METABOLIC PANEL WITH GFR
ALT: 7 U/L (ref 0–44)
AST: 10 U/L — ABNORMAL LOW (ref 15–41)
Albumin: 2 g/dL — ABNORMAL LOW (ref 3.5–5.0)
Alkaline Phosphatase: 52 U/L (ref 38–126)
Anion gap: 6 (ref 5–15)
BUN: 45 mg/dL — ABNORMAL HIGH (ref 6–20)
CO2: 23 mmol/L (ref 22–32)
Calcium: 8.2 mg/dL — ABNORMAL LOW (ref 8.9–10.3)
Chloride: 107 mmol/L (ref 98–111)
Creatinine, Ser: 1.57 mg/dL — ABNORMAL HIGH (ref 0.61–1.24)
GFR, Estimated: 50 mL/min — ABNORMAL LOW (ref >60)
Glucose, Bld: 81 mg/dL (ref 70–99)
Potassium: 4.4 mmol/L (ref 3.5–5.1)
Sodium: 136 mmol/L (ref 135–145)
Total Bilirubin: 0.3 mg/dL (ref 0.0–1.2)
Total Protein: 4.8 g/dL — ABNORMAL LOW (ref 6.5–8.1)

## 2023-11-06 LAB — GLUCOSE, CAPILLARY
Glucose-Capillary: 86 mg/dL (ref 70–99)
Glucose-Capillary: 131 mg/dL — ABNORMAL HIGH (ref 70–99)
Glucose-Capillary: 104 mg/dL — ABNORMAL HIGH (ref 70–99)
Glucose-Capillary: 126 mg/dL — ABNORMAL HIGH (ref 70–99)

## 2023-11-06 MED ORDER — SODIUM CHLORIDE 0.9 % IV SOLN: INTRAVENOUS | Status: DC

## 2023-11-06 NOTE — Progress Notes (Signed)
 Physical Therapy Treatment Patient Details Name: Frank [name]. MRN: [id number] DOB: 02/26/1966 Today's Date: 11/06/2023   History of Present Illness 58 y.o. male transferred from [facility] 07/03/23 to [facility] for management of newly diagnosed plasma cell myeloma with weakness and AMS. Pt also with AKI and metabolic encephalopathy. 5/10 transfer to [facility] for iHD. 4/22 Intubated and chemo initiated. 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 5/6 IVIG started. 5/8-6/11 trach. 5/12 iHD initiated. 5/29 PEG placed. 6/5 chemo initiated. 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 Pt request to get to [facility]. Given pt fatigue PT/OT utilized [facility] for transfers. Pt is able to perform a total of 6 sit<>stand in [place], varying from minAx2 to maxAx2 as fatigue increases. Pt request to return to bed so pt's wife could sit in the recliner. Wife arriving at the end of the session. Will follow back for treatment tomorrow.     If plan is discharge home, recommend the following: Two people to help with walking and/or transfers;Assistance with cooking/housework;Assist for transportation;Two people to help with bathing/dressing/bathroom;Direct supervision/assist for medications management;Direct supervision/assist for financial management;Help with stairs or ramp for entrance;Assistance with feeding   Can travel by private vehicle     No  Equipment Recommendations  Wheelchair (measurements PT);Wheelchair cushion (measurements PT)       Precautions / Restrictions Precautions Precautions: Fall Recall of Precautions/Restrictions: Intact Restrictions Weight Bearing Restrictions Per Provider Order: Yes LUE Weight Bearing Per Provider Order: Non weight bearing Other Position/Activity Restrictions: ROM to tolerance      Mobility  Bed Mobility Overal bed mobility: Needs Assistance Bed Mobility: Supine to Sit     Supine to sit: Contact guard Sit to supine: +2 for physical assistance, Total assist   General bed mobility comments: pt is able to get to EoB with use of bed rail and HoB elevated. Pt with increased fatigue at end of session and needs total Ax2 for safe return to supine after attempt to scoot hips back in bed.    Transfers Overall transfer level: Needs assistance Equipment used: Ambulation equipment used Transfers: Sit to/from Stand, Bed to chair/wheelchair/BSC Sit to Stand: Max assist, +2 physical assistance, Mod assist, Min assist Stand pivot transfers: Max assist, +2 physical assistance         General transfer comment: pt performed 6 sit to stands from the [place] with varying degrees of assist as pt fatigued with increased work. Pt is able to initially come to standing in [place] with minAx2 Transfer via Lift Equipment: Stedy  Ambulation/Gait               General Gait Details: Deferred at this time          Balance Overall balance assessment: Needs assistance Sitting-balance support: Feet supported, No upper extremity supported Sitting balance-Leahy Scale: Poor Sitting balance - Comments: able to maintain seated balance on BSC with knees blocked by [place], with static sitting EoB at end of session pt requiring outside assist to maintain balance. Postural control: Posterior lean Standing balance support: Single extremity supported Standing balance-Leahy Scale: Poor Standing balance comment: reliant no Stedy for support                            Communication Communication  Communication: Impaired Factors Affecting Communication: Reduced clarity of speech  Cognition Arousal: Alert Behavior During Therapy: WFL for tasks assessed/performed   PT - Cognitive impairments: Sequencing, Problem solving, Safety/Judgement Difficult to assess due to: Impaired  communication                     PT - Cognition Comments: continues to be easily distracted and needs increased cuing Following commands: Impaired Following commands impaired: Follows one step commands with increased time    Cueing Cueing Techniques: Verbal cues, Tactile cues, Visual cues     General Comments General comments (skin integrity, edema, etc.): Pt working on self care with OT today      Pertinent Vitals/Pain Pain Assessment Pain Assessment: Faces Faces Pain Scale: Hurts even more Breathing: normal Negative Vocalization: none Facial Expression: smiling or inexpressive Body Language: relaxed Consolability: no need to console PAINAD Score: 0 Pain Location: BLEs and LUE amputation Pain Descriptors / Indicators: Aching, Discomfort, Guarding, Moaning Pain Intervention(s): Limited activity within patient's tolerance, Monitored during session, Repositioned    Home Living Family/patient expects to be discharged to:: Private residence Living Arrangements: Spouse/significant other Available Help at Discharge: Family;Available PRN/intermittently Type of Home: Mobile home Home Access: Ramped entrance       Home Layout: One level Home Equipment: Agricultural consultant (2 wheels);Cane - single point;Wheelchair - Engineer, technical sales - power;Electric scooter;Shower seat;Grab bars - toilet;Grab bars - tub/shower;Hand held shower head;BSC/3in1 Additional Comments: Information from prior encounter        PT Goals (current goals can now be found in the care plan section) Acute Rehab PT Goals Patient Stated Goal: to get out of bed PT Goal Formulation: With patient Time For Goal Achievement: 11/06/23 Potential to Achieve Goals: Fair Progress towards PT goals: Progressing toward goals    Frequency    Min 5X/week           Co-evaluation PT/OT/SLP Co-Evaluation/Treatment: Yes Reason for Co-Treatment: For patient/therapist safety;To address functional/ADL transfers PT  goals addressed during session: Mobility/safety with mobility;Balance OT goals addressed during session: ADL's and self-care;Strengthening/ROM      AM-PAC PT 6 Clicks Mobility   Outcome Measure  Help needed turning from your back to your side while in a flat bed without using bedrails?: A Little Help needed moving from lying on your back to sitting on the side of a flat bed without using bedrails?: A Lot Help needed moving to and from a bed to a chair (including a wheelchair)?: Total Help needed standing up from a chair using your arms (e.g., wheelchair or bedside chair)?: Total Help needed to walk in hospital room?: Total Help needed climbing 3-5 steps with a railing? : Total 6 Click Score: 9    End of Session Equipment Utilized During Treatment: Gait belt Activity Tolerance: Patient limited by fatigue Patient left: in bed;with bed alarm set;with family/visitor present Nurse Communication: Mobility status PT Visit Diagnosis: Other abnormalities of gait and mobility (R26.89);Muscle weakness (generalized) (M62.81);Difficulty in walking, not elsewhere classified (R26.2)     Time: 9048-8957 PT Time Calculation (min) (ACUTE ONLY): 51 min  Charges:    $Therapeutic Activity: 8-22 mins PT General Charges $$ ACUTE PT VISIT: 1 Visit                     Shakim Faith B. Fleeta Lapidus PT, DPT Acute Rehabilitation Services Please use secure chat or  Call Office [phone]    [name] [facility] 11/06/2023, 2:03 PM

## 2023-11-06 NOTE — Progress Notes (Signed)
 Progress Note   Patient: Frank [name]. FMW:[id number] DOB: [birth year] DOA: 07/03/2023  DOS: the patient was seen and examined on 11/06/2023   Brief hospital course:  58 year old PMH OSA CPAP, pathological fracture and multiple ORIF left Humerus likely due to multiple myeloma, A-fib not on anticoagulation, hyperlipidemia, tobacco use disorder who was feeling weak for 1 week, admitted to [facility] with concern of sepsis, community-acquired pneumonia and acute encephalopathy.   Trasfered to [facility] initially and [name] saw the patient He stayed in the ICU for 36 days underwent trach also HD/CRRT 4/17: Admitted to medical floor with oncology consultation to treat multiple myeloma 4/19: Worsening respiratory status/encephalopathy, transferred to ICU -> intubated  4/20: A-fib with RVR. Started heparin . Developed groin and biopsy site hematoma requiring multiple transfusions 4/22: Oncology started Velcade  and Cytoxan   5/2: LP performed  5/8: Trach placed.  5/29: PEG placed 6/5: Last dose Kyprolis /Cytoxan , first dose Sarclisa  6/11: Trach removed. 6/13: TDC removed for line holiday 6/16: New HD catheter placed 6/23: removed HD catheter  6/28: first cycle of chemo 7/11: IR drain placed right hip for infected hematoma  7/17: bilateral lower extremity DVTs 7/28: amputation of left arm through humerus on 7/28 7/30: port-a-cath placed 8/7: IR perform sclerotherapy of the infected hematoma-second attempt 8/14: imaging right hip suggestive right buttock pigtail catheter with fluid collection 11 x 5 cm, collection anterior right thigh incompletely evacuated 8 x 6 cm-appears relatively stable 8/15: DC right gluteal IR drain. 8/19: 3rd dose Chemo given--now await SNF in [place]   Now most issues resolved and awaiting placement in [place].     Assessment and Plan:   Multiple myeloma - Port-A-Cath placed 7/30 by IR.  Started chemo with Velcade /Cytoxan  on 08/21/2023. Chemo changed to  Sarclisa /Kyprolis /Cytoxan  later on.  Managed closely by oncology.  Oncology and TOC to work on setting up outpatient chemotherapy in [place] upon discharge.  EPO per oncology today.    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  Wound VAC removed 8/1.  Dressing changes, Eliquis  per Ortho.  wash LUE incision daily with soap and water . Change dressing daily with gauze, kerlix, ace wrap. Ok to leave open to air if no drainage.  Oxy IR 10-15 q3 , gabapentin  200 at bedtime can use Dilaudid  0.5-1 every 4 as needed severe pain, continues Flexeril  5 3 times daily, gabapentin  200 at bedtime.    Infected right flank/gluteal hematoma - S/p IR drain placement 7/11.  Drain exchanged 7/25.  Completed antibiotic course 7/26.  Drain removed 8/15.     Acute kidney injury on CKD 3A - Briefly required CRRT.  AKI resolved, TDC removed.  Creatinine mildly elevated again this morning.  Possibly a bit prerenal, decreased p.o. intake.  Will initiate NS at 75ml/hr.  Encourage p.o. hydration.  Will repeat bmp in am.   Bilateral lower extremity DVT - Eliquis  on board.  Pt will be on life long anticoagulation.   Anemia of chronic renal disease - Hemoglobin stable.  EPO per oncology   Insulin -dependent diabetes mellitus - Continue insulin  sliding scale.   Paroxysmal atrial fibrillation - Rate well-controlled.  Continue verapamil , Eliquis .   Obstructive sleep apnea - Pt unable to tolerate CPAP mask.     Sacral Decubiti -turn q 2hr.   Goals of care -Working closely with TOC, pt, on disposition planning.  Looking SNF, bed is nearly available and auth approved.  Anticipate DC  Saturday.    Resolved problems: Sepsis with staph epi bactermia Dysphagia Aspergillosis with PNA    Subjective: Patient feeling well this morning.  Denies any fever, chills, chest pain, nausea, vomiting, abdominal pain.  Feeling less weak and wiped out  compared to yesterday.   Physical Exam:  Vitals:   11/05/23 2015 11/06/23 0512 11/06/23 0748 11/06/23 0815  BP: (!) 146/99 118/86 129/86   Pulse: 92 88 90 87  Resp:  18  16  Temp: 98.3 F (36.8 C) 98.4 F (36.9 C) 98.7 F (37.1 C)   TempSrc: Oral Oral Oral   SpO2:  95% 100% 98%  Weight:      Height:        GENERAL:  Alert, pleasant, no acute distress  HEENT:  EOMI CARDIOVASCULAR:  RRR, no murmurs appreciated RESPIRATORY:  Clear to auscultation, no wheezing, rales, or rhonchi GASTROINTESTINAL:  Soft, nontender, nondistended EXTREMITIES: LUE above elbow amputation with dressing. NEURO:  No new focal deficits appreciated SKIN:  No rashes noted PSYCH:  Appropriate mood and affect    Data Reviewed:  Imaging Studies: CT ABDOMEN PELVIS W CONTRAST Result Date: 10/31/2023 CLINICAL DATA:  Follow-up drainage catheter EXAM: CT ABDOMEN AND PELVIS WITH CONTRAST TECHNIQUE: Multidetector CT imaging of the abdomen and pelvis was performed using the standard protocol following bolus administration of intravenous contrast. RADIATION DOSE REDUCTION: This exam was performed according to the departmental dose-optimization program which includes automated exposure control, adjustment of the mA and/or kV according to patient size and/or use of iterative reconstruction technique. CONTRAST:  OMNIPAQUE  IOHEXOL  350 MG/ML SOLN COMPARISON:  10/07/2023 FINDINGS: Lower chest: Patchy bibasilar atelectasis is noted worse on the right than the left. Hepatobiliary: No focal liver abnormality is seen. No gallstones, gallbladder wall thickening, or biliary dilatation. Pancreas: Unremarkable. No pancreatic ductal dilatation or surrounding inflammatory changes. Spleen: Normal in size without focal abnormality. Adrenals/Urinary Tract: Adrenal glands are within normal limits. Kidneys demonstrate scattered cysts bilaterally. No follow-up is recommended. No renal calculi or obstructive changes are seen. The bladder is  partially distended. Stomach/Bowel: Scattered fecal material is noted throughout the colon without obstructive change. The appendix is within normal limits. Small bowel and stomach are within normal limits. Vascular/Lymphatic: Aortic atherosclerosis. No enlarged abdominal or pelvic lymph nodes. Reproductive: Prostate is unremarkable. Other: No abdominal wall hernia or abnormality. No abdominopelvic ascites. Musculoskeletal: Left hip replacement is noted. Degenerative changes of lumbar spine are seen. Chronic T11 compression fracture is noted. In the right buttock a pigtail drainage catheter is identified. There remains a fluid collection which measures approximately 11 by 5 cm in greatest transverse and AP dimensions respectively. The collection in the anterior right thigh is incompletely evaluated on this exam but measures at least 8.7 by 2.6 cm in transverse and AP dimensions respectively. This is slightly smaller seen on the prior study. IMPRESSION: Fluid collections in the anterior right thigh and right buttock. Drainage catheter remains in the right buttock. The overall appearance is relatively stable. The anterior right thigh collection is slightly smaller than that seen on the prior exam. Patchy atelectatic changes in the bases right greater than left. Electronically Signed   By: Oneil Devonshire M.D.   On: 10/31/2023 03:27   CT HUMERUS LEFT W CONTRAST Result Date: 10/27/2023 CLINICAL DATA:  Checking for cellulitis. An area of redness. History of multiple myeloma and left upper arm amputation EXAM: CT OF THE UPPER LEFT EXTREMITY WITH CONTRAST TECHNIQUE: Multidetector CT imaging of the left upper arm was performed according  to the standard protocol following intravenous contrast administration. RADIATION DOSE REDUCTION: This exam was performed according to the departmental dose-optimization program which includes automated exposure control, adjustment of the mA and/or kV according to patient size and/or use of  iterative reconstruction technique. CONTRAST:  OMNIPAQUE  IOHEXOL  350 MG/ML SOLN COMPARISON:  Intraoperative images 10/13/2023, left humeral radiographs 09/15/2023 and CT of the left elbow 07/01/2022. Chest CT 07/26/2023 FINDINGS: Bones/Joint/Cartilage In correlation with prior imaging, patient underwent distal humeral hardware removal and amputation through the mid humeral shaft 10/13/2023. Grossly stable postsurgical changes within the remaining distal humeral shaft from hardware removal with associated chronic periosteal thickening. No gross bone destruction, acute fracture or dislocation identified. Advanced glenohumeral degenerative changes with joint space narrowing, osteophytes and subchondral cyst formation in the glenoid and humeral head. Some of the cystic changes are removed from the subchondral bone and are likely secondary to the patient's multiple myeloma. Additional lytic lesions noted in the tip of the left scapula and left ribs. Small shoulder joint effusion with an ossified loose body in the axillary recess. Mild acromioclavicular degenerative changes. Ligaments Suboptimally assessed by CT. Muscles and Tendons Mild muscular atrophy. No intramuscular fluid collection or suspicious enhancement identified. Soft tissues Postsurgical changes related to recent amputation. Skin thickening along the amputation. No evidence of organized fluid collection, soft tissue emphysema or unexpected foreign body. There are multiple small solid nodules in the left lung, measuring up to 6 mm in the apex (image 50/4) and 7 mm in the lower lobe (image 113/4). Previous chest CT demonstrated widespread ground-glass opacities throughout both lungs which may have obscured these nodules. IMPRESSION: 1. Postsurgical changes related to recent amputation through the mid humeral shaft. No evidence of abscess or soft tissue emphysema. 2. No acute osseous findings. Grossly stable postsurgical changes in the distal humerus and  probable sequela of multiple myeloma. No specific evidence of osteomyelitis. 3. Advanced glenohumeral degenerative changes with small shoulder joint effusion and ossified loose body in the axillary recess. 4. Multiple small solid nodules in the left lung, measuring up to 7 mm in the lower lobe. Previous chest CT demonstrated widespread ground-glass opacities throughout both lungs which may have obscured these nodules. These findings are nonspecific and potentially postinflammatory. Metastatic disease considered less likely, although not completely excluded. Suggest follow-up chest CT due to incomplete visualization. Electronically Signed   By: Elsie Perone M.D.   On: 10/27/2023 09:46   IR SCLEROTHERAPY OF A FLUID COLLECTION Result Date: 10/23/2023 INDICATION: Right hip fluid collection continued. Previous doxycycline  sclerosis performed 10/10/2023. Output still persisting although diminished. Planned second and final attempted sclerosing fluid collection. EXAM: Drain change MEDICATIONS: The patient is currently admitted to the hospital and receiving intravenous antibiotics. The antibiotics were administered within an appropriate time frame prior to the initiation of the procedure. ANESTHESIA/SEDATION: Moderate (conscious) sedation was employed during this procedure. A total of Versed  2 mg and Fentanyl  150 mcg was administered intravenously by the radiology nurse. Total intra-service moderate Sedation Time: 25 mL Omnipaque  3 minutes. The patient's level of consciousness and vital signs were monitored continuously by radiology nursing throughout the procedure under my direct supervision. COMPLICATIONS: None immediate. PROCEDURE: Informed written consent was obtained from the patient after a thorough discussion of the procedural risks, benefits and alternatives. All questions were addressed. Maximal Sterile Barrier Technique was utilized including caps, mask, sterile gowns, sterile gloves, sterile drape, hand  hygiene and skin antiseptic. A timeout was performed prior to the initiation of the procedure. With the patient  in a supine position, external portion of drainage in the right flank were prepped and draped in the usual sterile. Aggressive flushing was normal saline. Significant decrease in overall size the when to previous. When all of flushing began to return mostly clear rest of pocket was evacuated. Short Amplatz wire was then advanced through the catheter and the catheter was completely patient. New 10 catheter was then advanced over the guidewire coiled more medially. Approximately 40 mL of reconstituted doxycycline  was then injected into the catheter and the catheter was capped. Retention suture and sterile dressing applied. The catheter will be reopened to suction approximately 2 hours. That time there will be no more flushing of the catheter. IMPRESSION: Repeat sclerosing right flank cavity using combination fluoroscopy and aggressive flushing with doxycycline . Electronically Signed   By: Cordella Banner   On: 10/23/2023 16:23   IR Catheter Tube Change Result Date: 10/23/2023 INDICATION: Right hip fluid collection continued. Previous doxycycline  sclerosis performed 10/10/2023. Output still persisting although diminished. Planned second and final attempted sclerosing fluid collection. EXAM: Drain change MEDICATIONS: The patient is currently admitted to the hospital and receiving intravenous antibiotics. The antibiotics were administered within an appropriate time frame prior to the initiation of the procedure. ANESTHESIA/SEDATION: Moderate (conscious) sedation was employed during this procedure. A total of Versed  2 mg and Fentanyl  150 mcg was administered intravenously by the radiology nurse. Total intra-service moderate Sedation Time: 25 mL Omnipaque  3 minutes. The patient's level of consciousness and vital signs were monitored continuously by radiology nursing throughout the procedure under my direct  supervision. COMPLICATIONS: None immediate. PROCEDURE: Informed written consent was obtained from the patient after a thorough discussion of the procedural risks, benefits and alternatives. All questions were addressed. Maximal Sterile Barrier Technique was utilized including caps, mask, sterile gowns, sterile gloves, sterile drape, hand hygiene and skin antiseptic. A timeout was performed prior to the initiation of the procedure. With the patient in a supine position, external portion of drainage in the right flank were prepped and draped in the usual sterile. Aggressive flushing was normal saline. Significant decrease in overall size the when to previous. When all of flushing began to return mostly clear rest of pocket was evacuated. Short Amplatz wire was then advanced through the catheter and the catheter was completely patient. New 10 catheter was then advanced over the guidewire coiled more medially. Approximately 40 mL of reconstituted doxycycline  was then injected into the catheter and the catheter was capped. Retention suture and sterile dressing applied. The catheter will be reopened to suction approximately 2 hours. That time there will be no more flushing of the catheter. IMPRESSION: Repeat sclerosing right flank cavity using combination fluoroscopy and aggressive flushing with doxycycline . Electronically Signed   By: Cordella Banner   On: 10/23/2023 16:23   IR IMAGING GUIDED PORT INSERTION Result Date: 10/15/2023 INDICATION: 58 year old male with history multiple myeloma requiring central venous access for chemotherapy administration EXAM: IMPLANTED PORT A CATH PLACEMENT WITH ULTRASOUND AND FLUOROSCOPIC GUIDANCE COMPARISON:  None Available. MEDICATIONS: None. ANESTHESIA/SEDATION: Moderate (conscious) sedation was employed during this procedure. A total of Versed  1.5 mg and Fentanyl  75 mcg was administered intravenously. Moderate Sedation Time: 14 minutes. The patient's level of consciousness and  vital signs were monitored continuously by radiology nursing throughout the procedure under my direct supervision. CONTRAST:  None FLUOROSCOPY TIME:  Ten mGy reference air kerma COMPLICATIONS: None immediate. PROCEDURE: The procedure, risks, benefits, and alternatives were explained to the patient. Questions regarding the procedure were encouraged and answered.  The patient understands and consents to the procedure. The right neck and chest were prepped with chlorhexidine  in a sterile fashion, and a sterile drape was applied covering the operative field. Maximum barrier sterile technique with sterile gowns and gloves were used for the procedure. A timeout was performed prior to the initiation of the procedure. Ultrasound was used to examine the jugular vein which was compressible and free of internal echoes. A skin marker was used to demarcate the planned venotomy and port pocket incision sites. Local anesthesia was provided to these sites and the subcutaneous tunnel track with 1% lidocaine  with 1:100,000 epinephrine . A small incision was created at the jugular access site and blunt dissection was performed of the subcutaneous tissues. Under ultrasound guidance, the jugular vein was accessed with a 21 ga micropuncture needle and an 0.018 wire was inserted to the superior vena cava. Real-time ultrasound guidance was utilized for vascular access including the acquisition of a permanent ultrasound image documenting patency of the accessed vessel. A 5 Fr micopuncture set was then used, through which a 0.035 Rosen wire was passed under fluoroscopic guidance into the inferior vena cava. An 8 Fr dilator was then placed over the wire. A subcutaneous port pocket was then created along the upper chest wall utilizing a combination of sharp and blunt dissection. The pocket was irrigated with sterile saline, packed with gauze, and observed for hemorrhage. A single lumen ISP sized power injectable port was chosen for  placement. The 8 Fr catheter was tunneled from the port pocket site to the venotomy incision. The port was placed in the pocket. The external catheter was trimmed to appropriate length. The dilator was exchanged for an 8 Fr peel-away sheath under fluoroscopic guidance. The catheter was then placed through the sheath and the sheath was removed. Final catheter positioning was confirmed and documented with a fluoroscopic spot radiograph. The port was accessed with a Huber needle, aspirated, and flushed with heparinized saline. The deep dermal layer of the port pocket incision was closed with interrupted 3-0 Vicryl suture. Dermabond was then placed over the port pocket and neck incisions. The patient tolerated the procedure well without immediate post procedural complication. FINDINGS: After catheter placement, the tip lies within the superior cavoatrial junction. The catheter aspirates and flushes normally and is ready for immediate use. IMPRESSION: Successful placement of a power injectable Port-A-Cath via the right internal jugular vein. The catheter is ready for immediate use. Ester Sides, MD [facility] Electronically Signed   By: Ester Sides M.D.   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 Multiple myeloma. EXAM: LEFT HUMERUS - 2+ VIEW COMPARISON:  09/15/2023 FINDINGS: A single C-arm image demonstrates the recently demonstrated lytic changes in the distal humerus and associated fixation hardware. IMPRESSION: Single C-arm image of the distal humerus and associated fixation hardware. Electronically Signed   By: Elspeth Bathe M.D.   On: 10/13/2023 11:05   DG C-Arm 1-60 Min-No Report Result Date: 10/13/2023 Fluoroscopy was utilized by the requesting physician.  No radiographic interpretation.   DG C-Arm 1-60 Min-No Report Result Date: 10/13/2023 Fluoroscopy was  utilized by the requesting physician.  No radiographic interpretation.   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 Planned sclerosing. EXAM: Sclerosing of abnormal fluid collection COMPARISON:  None Available. MEDICATIONS: 500 mg doxycycline ; intra procedural ANESTHESIA/SEDATION: Moderate (conscious)  sedation was employed during this procedure. A total of Versed  2 mg and Fentanyl  100 mcg was administered intravenously by the radiology nurse. Total intra-service moderate Sedation Time: 11 minutes. The patient's level of consciousness and vital signs were monitored continuously by radiology nursing throughout the procedure under my direct supervision. CONTRAST:  10 mL Omnipaque  300-administered into the collecting system(s) FLUOROSCOPY: Radiation Exposure Index (as provided by the fluoroscopic device): 5 mGy Kerma COMPLICATIONS: None immediate. PROCEDURE: Informed written consent was obtained from the patient after a thorough discussion of the procedural risks, benefits and alternatives. All questions were addressed. Maximal Sterile Barrier Technique was utilized including caps, mask, sterile gowns, sterile gloves, sterile drape, hand hygiene and skin antiseptic. A timeout was performed prior to the initiation of the procedure. The indwelling catheter in the right hip region prepped and draped in usual sterile fashion. The indwelling 10 French catheter was aggressively irrigated with normal saline and flushed. Dilute contrast was then injected under fluoroscopic guidance into the catheter to evaluate if the fluid collection was communicating with vascular or lymphatic structure. Contrast flows freely into the catheter and outlines the fluid collection which is irregular in appearance, however there is no vascular or lymphatic communication identified. The contrast was then mostly withdrawn. The catheter and  retention sutures were cut. A new 10 French catheter was then advanced over a guidewire into the fluid collection and repositioned in a more cephalad location. Approximately 500 mg of doxycycline  was reconstituted with 50 mL of normal saline and then approximately 35 mL of this mixture was injected into the 10 French pigtail catheter. The catheter was then capped. Retention suture and sterile dressing were applied. The doxycycline  will dwell within the cavity for approximately 1 hour and then the catheter will be connected to a JP bulb and the patient will be monitored for output. IMPRESSION: Satisfactory exchange of a 10 French right hip drainage catheter with sclerosing using doxycycline  as described above. Catheter will remain to a JP bulb until output has sufficiently diminished. Electronically Signed   By: Cordella Banner   On: 10/10/2023 17:18   CT PELVIS WO CONTRAST Result Date: 10/07/2023 CLINICAL DATA:  Right hip drain evaluation. EXAM: CT PELVIS WITHOUT CONTRAST TECHNIQUE: Multidetector CT imaging of the pelvis was performed following the standard protocol without intravenous contrast. RADIATION DOSE REDUCTION: This exam was performed according to the departmental dose-optimization program which includes automated exposure control, adjustment of the mA and/or kV according to patient size and/or use of iterative reconstruction technique. COMPARISON:  CT abdomen pelvis dated 07/08/2023. FINDINGS: Urinary Tract: The visualized ureters appear unremarkable. Punctate stone noted in the posterior bladder lumen. The urinary bladder is otherwise unremarkable. Bowel:  Moderate stool in the colon.  The appendix is normal. Vascular/Lymphatic: Moderate aortoiliac atherosclerotic disease. No pelvic adenopathy. Reproductive: The prostate and seminal vesicles are grossly unremarkable. Other: Right gluteal pigtail drainage catheter. Significant interval decrease in the size of the hematoma in the right gluteal muscle  now measuring approximately 4.7 x 9.3 cm. Additional loculated collection in the anterior upper right thigh significantly decreased in size since the prior CT and measures approximately 10 x 17 cm in greatest axial dimensions. Musculoskeletal: Osteopenia with degenerative changes of the spine. Total left hip arthroplasty. Scattered lucent lesions throughout the bones as seen on the prior CT. IMPRESSION: 1. Right gluteal pigtail drainage catheter with significant interval decrease in the size of the hematoma in the right gluteal muscle. 2. Significant interval decrease in the size of the loculated  collection in the anterior upper right thigh. 3. Punctate stone in the posterior bladder lumen. 4.  Aortic Atherosclerosis (ICD10-I70.0). Electronically Signed   By: Vanetta Chou M.D.   On: 10/07/2023 16:53    There are no new results to review at this time.  Previous records (including but not limited to H&P, progress notes, nursing notes, TOC management) were reviewed in assessment of this patient.  Labs: CBC: Recent Labs  Lab 10/31/23 0331 11/01/23 0351 11/04/23 0404 11/05/23 0324 11/06/23 0309  WBC 3.4* 3.6* 3.7* 5.9 2.5*  NEUTROABS  --   --  2.1 4.4 1.3*  HGB 8.9* 8.9* 9.2* 8.8* 9.5*  HCT 28.5* 28.1* 29.7* 27.8* 30.4*  MCV 97.6 96.9 99.3 99.3 98.7  PLT 329 320 331 277 254   Basic Metabolic Panel: Recent Labs  Lab 11/02/23 0410 11/03/23 0351 11/04/23 0404 11/05/23 0324 11/06/23 0309  NA 138 137 135 135 136  K 4.6 4.5 4.1 3.9 4.4  CL 105 104 106 105 107  CO2 25 26 25 23 23   GLUCOSE 101* 101* 98 124* 81  BUN 50* 51* 42* 40* 45*  CREATININE 1.16 1.37* 1.28* 1.53* 1.57*  CALCIUM  8.5* 8.5* 8.4* 7.9* 8.2*   Liver Function Tests: Recent Labs  Lab 10/31/23 0331 11/01/23 0351 11/04/23 0404 11/05/23 0324 11/06/23 0309  AST 8* 9* 8* 9* 10*  ALT 6 7 7 6 7   ALKPHOS 59 58 59 55 52  BILITOT 0.5 0.5 0.4 0.6 0.3  PROT 4.8* 4.7* 4.9* 4.6* 4.8*  ALBUMIN  2.1* 2.2* 2.2* 2.0* 2.0*    CBG: Recent Labs  Lab 11/05/23 0618 11/05/23 1142 11/05/23 1620 11/05/23 2040 11/06/23 0754  GLUCAP 109* 97 121* 120* 86    Scheduled Meds:  (feeding supplement) PROSource Plus  30 mL Oral TID BM   acyclovir   400 mg Oral BID   apixaban   5 mg Oral BID   collagenase    Topical Daily   docusate sodium   100 mg Oral BID   epoetin  alfa  40,000 Units Subcutaneous Q Wed-1800   feeding supplement  237 mL Oral BID WC   gabapentin   200 mg Oral QHS   guaiFENesin   600 mg Oral BID   heparin  lock flush  500 Units Intravenous Once   hydrALAZINE   25 mg Oral Q8H   insulin  aspart  0-15 Units Subcutaneous TID AC & HS   insulin  aspart  2 Units Subcutaneous TID AC & HS   insulin  glargine  10 Units Subcutaneous Daily   lidocaine   1 patch Transdermal Q24H   montelukast   5 mg Oral QHS   multivitamin with minerals  1 tablet Oral Daily   nutrition supplement (JUVEN)  1 packet Oral BID AC   [START ON 11/07/2023] ondansetron  (ZOFRAN ) IV  4 mg Intravenous Q8H   mouth rinse  15 mL Mouth Rinse 4 times per day   pantoprazole   40 mg Oral BID   PARoxetine   40 mg Oral Daily   senna  1 tablet Oral Daily   umeclidinium-vilanterol  1 puff Inhalation Daily   valproic  acid  750 mg Oral BID   verapamil   120 mg Oral Daily   Continuous Infusions:  sodium chloride  Stopped (10/13/23 0952)   sodium chloride  Stopped (08/28/23 0839)   sodium chloride      sodium chloride  75 mL/hr at 11/06/23 0952   PRN Meds:.acetaminophen  (TYLENOL ) oral liquid 160 mg/5 mL **OR** acetaminophen , alum & mag hydroxide-simeth, benzonatate , cyclobenzaprine , HYDROmorphone  (DILAUDID ) injection, HYDROmorphone , hydrOXYzine , ipratropium-albuterol , polyethylene glycol, artificial tears,  sodium chloride  flush  Family Communication: None at bedside  Disposition: Status is: Inpatient Remains inpatient appropriate because: Multiple myeloma     Time spent: 37 minutes  Length of inpatient stay: 126 days  Author: Carliss LELON Canales,  DO 11/06/2023 11:26 AM  For on call review [email or web address].

## 2023-11-06 NOTE — Progress Notes (Incomplete)
 Pt's wife called to say pt has appointment with oncologist in [place]

## 2023-11-06 NOTE — Plan of Care (Signed)
?  Problem: Health Behavior/Discharge Planning: ?Goal: Ability to manage health-related needs will improve ?Outcome: Progressing ?  ?Problem: Clinical Measurements: ?Goal: Ability to maintain clinical measurements within normal limits will improve ?Outcome: Progressing ?Goal: Will remain free from infection ?Outcome: Progressing ?Goal: Diagnostic test results will improve ?Outcome: Progressing ?  ?Problem: Nutrition: ?Goal: Adequate nutrition will be maintained ?Outcome: Progressing ?  ?

## 2023-11-06 NOTE — Progress Notes (Signed)
 Patient Alert and oriented x 4, Received in bed very pleasant requested pain med, verbalized pain level of 8.Administered Dilaudid  1 mg via IV to right chest port. Continue NS @75  cc/hr via right chest port tolerating well, male purwick in place output 1000cc at this time. Call bell within reach, cont with POC.

## 2023-11-06 NOTE — Plan of Care (Signed)
  Problem: Health Behavior/Discharge Planning: Goal: Ability to manage health-related needs will improve Outcome: Progressing   Problem: Pain Managment: Goal: General experience of comfort will improve and/or be controlled Outcome: Progressing   Problem: Safety: Goal: Ability to remain free from injury will improve Outcome: Progressing   Problem: Skin Integrity: Goal: Risk for impaired skin integrity will decrease Outcome: Progressing   Problem: Education: Goal: Ability to describe self-care measures that may prevent or decrease complications (Diabetes Survival Skills Education) will improve Outcome: Progressing

## 2023-11-06 NOTE — Progress Notes (Signed)
 Occupational Therapy Treatment Patient Details Name: Frank [name]. MRN: [id number] DOB: 1965/11/18 Today's Date: 11/06/2023   History of present illness 58 y.o. male transferred from [facility] 07/03/23 to [facility] for management of newly diagnosed plasma cell myeloma with weakness and AMS. Pt also with AKI and metabolic encephalopathy. 5/10 transfer to [facility] for iHD. 4/22 Intubated and chemo initiated. 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 5/6 IVIG started. 5/8-6/11 trach. 5/12 iHD initiated. 5/29 PEG placed. 6/5 chemo initiated. 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 Patient able to perform 6 stands total today with increased assistance with each stand. Patient able to tolerate standing till toilet hygiene was complete but decreased standing tolerance following each stand. Patient asked to return to bed due to spouse coming to visit and needs recliner to elevate BLEs. Patient will benefit from continued inpatient follow up therapy, <3 hours/day. Acute OT to continue to follow to address established goals to facilitate DC to next venue of care.        If plan is discharge home, recommend the following:  Two people to help with walking and/or transfers;Assistance with cooking/housework;Assistance with feeding;Direct supervision/assist for medications management;Direct supervision/assist for financial management;Assist for transportation;Help with stairs or ramp for entrance;A lot of help with bathing/dressing/bathroom   Equipment Recommendations  Other (comment) (TBD)    Recommendations for Other Services      Precautions / Restrictions Precautions Precautions: Fall Recall of Precautions/Restrictions: Intact Restrictions Weight Bearing Restrictions Per Provider Order: Yes LUE  Weight Bearing Per Provider Order: Non weight bearing Other Position/Activity Restrictions: ROM to tolerance       Mobility Bed Mobility Overal bed mobility: Needs Assistance Bed Mobility: Supine to Sit, Sit to Supine Rolling: Supervision   Supine to sit: Contact guard, Min assist Sit to supine: Max assist, +2 for physical assistance   General bed mobility comments: increased time and effort with use of bedrail pt is able to bring LE off bed light min A for bringing trunk to upright    Transfers Overall transfer level: Needs assistance Equipment used: Ambulation equipment used Transfers: Sit to/from Stand, Bed to chair/wheelchair/BSC Sit to Stand: Max assist, +2 physical assistance           General transfer comment: Stedy utilized for transfer to/from [facility] Transfer via Lift Equipment: [employer] Overall balance assessment: Needs assistance Sitting-balance support: Feet supported, No upper extremity supported Sitting balance-Leahy Scale: Fair Sitting balance - Comments: supervision seated on EOB   Standing balance support: Single extremity supported Standing balance-Leahy Scale: Poor Standing balance comment: reliant no Stedy for support                           ADL either performed or assessed with clinical judgement   ADL Overall ADL's : Needs assistance/impaired                 Upper Body Dressing : Moderate assistance;Bed level Upper Body Dressing Details (indicate cue type and reason): increased assistance due to IV Lower Body Dressing: Supervision/safety;Sitting/lateral leans Lower Body Dressing Details (indicate cue type and reason): to donn slip on shoes Toilet Transfer: Maximal assistance;+2 for physical assistance;BSC/3in1 Toilet Transfer Details (indicate cue type and reason): transfer to [facility] with [facility] Toileting-  Clothing Manipulation and Hygiene: Sit to/from stand;Total assistance Toileting - Clothing Manipulation Details  (indicate cue type and reason): stood in stedy for toilet hygiene, patient unable to assist due to dependent on UE support            Extremity/Trunk Assessment Upper Extremity Assessment RUE Deficits / Details: Generalized weakness, pt using functionally for mobility RUE Sensation: WNL LUE Deficits / Details: s/p LUE ambutation at the distal humerus. Pain well controlled, able to raise to 80 deg of shoulder flexion, attempted gentle PROM and pain at roughly 95 deg. Noted to have slight scaupular winging ~15 to 20 deg. does c/o phantom limb pain LUE Sensation: WNL LUE Coordination: decreased fine motor;decreased gross motor   Lower Extremity Assessment RLE Deficits / Details: Spontaneous general movement of LE's but not to command. Movement primarily at hips and none seen at ankles. PROM in ankles with effort to achieve neutral dorsiflexion LLE Deficits / Details: J-P drain in L thigh draining hematoma, still warm to touch since drain placed but size of thigh is greatly reduced   Cervical / Trunk Assessment Cervical / Trunk Assessment: Kyphotic    Vision       Perception     Praxis     Communication Communication Communication: Impaired Factors Affecting Communication: Reduced clarity of speech   Cognition Arousal: Alert Behavior During Therapy: WFL for tasks assessed/performed                                 Following commands: Impaired Following commands impaired: Follows one step commands with increased time      Cueing   Cueing Techniques: Verbal cues, Tactile cues, Visual cues  Exercises      Shoulder Instructions       General Comments patient declined recliner at end of session due to spouse coming to visit and sit in recliner    Pertinent Vitals/ Pain       Pain Assessment Pain Assessment: Faces Faces Pain Scale: Hurts even more Pain Location: BLEs and LUE amputation Pain Descriptors / Indicators: Aching, Discomfort, Guarding,  Moaning Pain Intervention(s): Limited activity within patient's tolerance, Monitored during session, Repositioned  Home Living Family/patient expects to be discharged to:: Private residence Living Arrangements: Spouse/significant other Available Help at Discharge: Family;Available PRN/intermittently Type of Home: Mobile home Home Access: Ramped entrance     Home Layout: One level     Bathroom Shower/Tub: Walk-in shower;Tub only   Bathroom Toilet: Handicapped height Bathroom Accessibility: Yes   Home Equipment: Agricultural consultant (2 wheels);Cane - single point;Wheelchair - Engineer, technical sales - power;Electric scooter;Shower seat;Grab bars - toilet;Grab bars - tub/shower;Hand held shower head;BSC/3in1   Additional Comments: Information from prior encounter      Prior Functioning/Environment              Frequency  Min 1X/week (on [facility] Program)        Progress Toward Goals  OT Goals(current goals can now be found in the care plan section)  Progress towards OT goals: Progressing toward goals  Acute Rehab OT Goals Patient Stated Goal: go to SNF in [place] to be closer to home OT Goal Formulation: With patient Time For Goal Achievement: 11/12/23 Potential to Achieve Goals: Fair ADL Goals Pt Will Perform Grooming: sitting;with set-up (A/E PRN) Pt Will Perform Upper Body Dressing: sitting;with contact guard assist;with adaptive equipment Pt Will Perform Lower Body Dressing: sitting/lateral leans;sit to/from stand;with mod assist (using one-handed RUE techniques)  Pt Will Transfer to Toilet: with contact guard assist;bedside commode;with transfer board Pt/caregiver will Perform Home Exercise Program: Increased ROM;Left upper extremity;With written HEP provided;Independently Additional ADL Goal #1: Pt will complete bed mobility with min A as a precursor to EOB ADLs Additional ADL Goal #2: Pt will demonstrate ability to stand statically for >3 mins to demonstrate improving  tolerance in prep for OOB mobility Additional ADL Goal #3: Pt will demonstrate independence with sensory stimulation to LUE to improve phantom symptoms.  Plan      Co-evaluation    PT/OT/SLP Co-Evaluation/Treatment: Yes Reason for Co-Treatment: For patient/therapist safety;To address functional/ADL transfers PT goals addressed during session: Mobility/safety with mobility;Balance OT goals addressed during session: ADL's and self-care;Strengthening/ROM      AM-PAC OT 6 Clicks Daily Activity     Outcome Measure   Help from another person eating meals?: A Little Help from another person taking care of personal grooming?: A Little Help from another person toileting, which includes using toliet, bedpan, or urinal?: Total Help from another person bathing (including washing, rinsing, drying)?: A Lot Help from another person to put on and taking off regular upper body clothing?: A Little Help from another person to put on and taking off regular lower body clothing?: A Lot 6 Click Score: 14    End of Session Equipment Utilized During Treatment: Gait belt;Other (comment) Laurent)  OT Visit Diagnosis: Unsteadiness on feet (R26.81);Other abnormalities of gait and mobility (R26.89);Muscle weakness (generalized) (M62.81);Pain Pain - Right/Left: Left Pain - part of body: Leg;Shoulder (and RLE)   Activity Tolerance Patient tolerated treatment well   Patient Left in bed;with call bell/phone within reach   Nurse Communication Mobility status        Time: 9048-8957 OT Time Calculation (min): 51 min  Charges: OT General Charges $OT Visit: 1 Visit OT Treatments $Self Care/Home Management : 8-22 mins  [name], OTA Acute Rehabilitation Services  Office [phone]   [name] 11/06/2023, 2:04 PM

## 2023-11-07 DIAGNOSIS — B449 Aspergillosis, unspecified: Secondary | ICD-10-CM | POA: Diagnosis not present

## 2023-11-07 DIAGNOSIS — G40909 Epilepsy, unspecified, not intractable, without status epilepticus: Secondary | ICD-10-CM

## 2023-11-07 DIAGNOSIS — A419 Sepsis, unspecified organism: Secondary | ICD-10-CM | POA: Diagnosis not present

## 2023-11-07 DIAGNOSIS — J45909 Unspecified asthma, uncomplicated: Secondary | ICD-10-CM

## 2023-11-07 DIAGNOSIS — R6521 Severe sepsis with septic shock: Secondary | ICD-10-CM | POA: Diagnosis not present

## 2023-11-07 DIAGNOSIS — C9000 Multiple myeloma not having achieved remission: Secondary | ICD-10-CM | POA: Diagnosis not present

## 2023-11-07 DIAGNOSIS — J181 Lobar pneumonia, unspecified organism: Secondary | ICD-10-CM | POA: Diagnosis not present

## 2023-11-07 LAB — CBC WITH DIFFERENTIAL/PLATELET
Abs Immature Granulocytes: 0.03 K/uL (ref 0.00–0.07)
Basophils Absolute: 0 K/uL (ref 0.0–0.1)
Basophils Relative: 0 %
Eosinophils Absolute: 0 K/uL (ref 0.0–0.5)
Eosinophils Relative: 1 %
HCT: 29.7 % — ABNORMAL LOW (ref 39.0–52.0)
Hemoglobin: 9.4 g/dL — ABNORMAL LOW (ref 13.0–17.0)
Immature Granulocytes: 1 %
Lymphocytes Relative: 20 %
Lymphs Abs: 0.5 K/uL — ABNORMAL LOW (ref 0.7–4.0)
MCH: 31.1 pg (ref 26.0–34.0)
MCHC: 31.6 g/dL (ref 30.0–36.0)
MCV: 98.3 fL (ref 80.0–100.0)
Monocytes Absolute: 0.7 K/uL (ref 0.1–1.0)
Monocytes Relative: 29 %
Neutro Abs: 1.1 K/uL — ABNORMAL LOW (ref 1.7–7.7)
Neutrophils Relative %: 49 %
Platelets: 245 K/uL (ref 150–400)
RBC: 3.02 MIL/uL — ABNORMAL LOW (ref 4.22–5.81)
RDW: 20.5 % — ABNORMAL HIGH (ref 11.5–15.5)
WBC: 2.3 K/uL — ABNORMAL LOW (ref 4.0–10.5)
nRBC: 0.9 % — ABNORMAL HIGH (ref 0.0–0.2)

## 2023-11-07 LAB — COMPREHENSIVE METABOLIC PANEL WITH GFR
ALT: 7 U/L (ref 0–44)
AST: 11 U/L — ABNORMAL LOW (ref 15–41)
Albumin: 2 g/dL — ABNORMAL LOW (ref 3.5–5.0)
Alkaline Phosphatase: 48 U/L (ref 38–126)
Anion gap: 5 (ref 5–15)
BUN: 41 mg/dL — ABNORMAL HIGH (ref 6–20)
CO2: 24 mmol/L (ref 22–32)
Calcium: 8.5 mg/dL — ABNORMAL LOW (ref 8.9–10.3)
Chloride: 110 mmol/L (ref 98–111)
Creatinine, Ser: 1.43 mg/dL — ABNORMAL HIGH (ref 0.61–1.24)
GFR, Estimated: 56 mL/min — ABNORMAL LOW (ref >60)
Glucose, Bld: 86 mg/dL (ref 70–99)
Potassium: 4.4 mmol/L (ref 3.5–5.1)
Sodium: 139 mmol/L (ref 135–145)
Total Bilirubin: 0.4 mg/dL (ref 0.0–1.2)
Total Protein: 4.8 g/dL — ABNORMAL LOW (ref 6.5–8.1)

## 2023-11-07 LAB — GLUCOSE, CAPILLARY
Glucose-Capillary: 90 mg/dL (ref 70–99)
Glucose-Capillary: 85 mg/dL (ref 70–99)

## 2023-11-07 LAB — MAGNESIUM: Magnesium: 2.1 mg/dL (ref 1.7–2.4)

## 2023-11-07 MED ORDER — UMECLIDINIUM-VILANTEROL 62.5-25 MCG/ACT IN AEPB: 1.0000 | INHALATION_SPRAY | Freq: Every day | RESPIRATORY_TRACT | Status: AC

## 2023-11-07 MED ORDER — GABAPENTIN 100 MG PO CAPS: 200.0000 mg | ORAL_CAPSULE | Freq: Every day | ORAL | Status: DC

## 2023-11-07 MED ORDER — INSULIN GLARGINE 100 UNIT/ML ~~LOC~~ SOLN: 10.0000 [IU] | Freq: Every day | SUBCUTANEOUS | Status: DC

## 2023-11-07 MED ORDER — VALPROIC ACID 250 MG/5ML PO SOLN: 750.0000 mg | Freq: Two times a day (BID) | ORAL | Status: AC

## 2023-11-07 MED ORDER — ACYCLOVIR 200 MG PO CAPS: 400.0000 mg | ORAL_CAPSULE | Freq: Two times a day (BID) | ORAL | Status: AC

## 2023-11-07 MED ORDER — HYDRALAZINE HCL 25 MG PO TABS: 25.0000 mg | ORAL_TABLET | Freq: Three times a day (TID) | ORAL | Status: DC

## 2023-11-07 MED ORDER — MONTELUKAST SODIUM 5 MG PO CHEW: 5.0000 mg | CHEWABLE_TABLET | Freq: Every day | ORAL | Status: AC

## 2023-11-07 MED ORDER — HYDROMORPHONE HCL 2 MG PO TABS: 2.0000 mg | ORAL_TABLET | Freq: Four times a day (QID) | ORAL | 0 refills | Status: DC | PRN

## 2023-11-07 MED ORDER — INSULIN ASPART 100 UNIT/ML IJ SOLN: 2.0000 [IU] | Freq: Three times a day (TID) | INTRAMUSCULAR | Status: AC

## 2023-11-07 MED ORDER — DOCUSATE SODIUM 100 MG PO CAPS: 100.0000 mg | ORAL_CAPSULE | Freq: Two times a day (BID) | ORAL | Status: DC

## 2023-11-07 MED ORDER — POLYETHYLENE GLYCOL 3350 17 G PO PACK: 17.0000 g | PACK | Freq: Every day | ORAL | Status: AC | PRN

## 2023-11-07 MED ORDER — HYDROXYZINE HCL 10 MG PO TABS: 10.0000 mg | ORAL_TABLET | Freq: Three times a day (TID) | ORAL | Status: AC | PRN

## 2023-11-07 MED ORDER — COLLAGENASE 250 UNIT/GM EX OINT: TOPICAL_OINTMENT | Freq: Every day | CUTANEOUS | Status: AC

## 2023-11-07 MED ORDER — APIXABAN 5 MG PO TABS: 5.0000 mg | ORAL_TABLET | Freq: Two times a day (BID) | ORAL | Status: DC

## 2023-11-07 MED ORDER — LIDOCAINE 5 % EX PTCH: 1.0000 | MEDICATED_PATCH | CUTANEOUS | Status: AC

## 2023-11-07 MED ORDER — INSULIN ASPART 100 UNIT/ML IJ SOLN: 0.0000 [IU] | Freq: Three times a day (TID) | INTRAMUSCULAR | Status: AC

## 2023-11-07 NOTE — Discharge Summary (Signed)
 Physician Discharge Summary   Patient: Frank [name]. MRN: [id number] DOB: 07-31-1965  Admit date:     07/03/2023  Discharge date: 11/07/23  Discharge Physician: Carliss LELON Canales   PCP: [name], MD   Recommendations at discharge:    Pt to be discharged [facility] .   If you experience worsening fever, chills, chest pain, shortness of breath, or other concerning symptoms, please call your PCP or go to the emergency department immediately.  Discharge Diagnoses: Principal Problem:   Severe sepsis with septic shock (HCC) Active Problems:   Aspergillosis, unspecified (HCC)   Multiple myeloma not having achieved remission (HCC)   Lobar pneumonia, unspecified organism (HCC)   Closed fracture of left distal humerus   AKI (acute kidney injury) (HCC)   Acute hypoxemic respiratory failure (HCC)   PAF (paroxysmal atrial fibrillation) (HCC)   Acute metabolic encephalopathy   Protein-calorie malnutrition, severe   Hematoma of right thigh   Seizure disorder (HCC)   DMII (diabetes mellitus, type 2) (HCC)   Pressure injury of skin   CAD (coronary artery disease)   Chronic diastolic CHF (congestive heart failure) (HCC)   CKD (chronic kidney disease), stage IIIa (HCC)   Class 1 obesity   Asthma   Bipolar disorder (HCC)   Febrile neutropenia (HCC)   Generalized weakness   Anemia  Resolved Problems:   * No resolved hospital problems. *   Hospital Course:  58 year old PMH OSA CPAP, pathological fracture and multiple ORIF left Humerus likely due to multiple myeloma, A-fib not on anticoagulation, hyperlipidemia, tobacco use disorder who was feeling weak for 1 week, admitted to [facility] with concern of sepsis, community-acquired pneumonia and acute encephalopathy.    Trasfered to [facility] initially and [name] saw the patient He stayed in the ICU for 36 days underwent trach also HD/CRRT 4/17: Admitted to medical floor with oncology consultation to treat  multiple myeloma 4/19: Worsening respiratory status/encephalopathy, transferred to ICU -> intubated  4/20: A-fib with RVR. Started heparin . Developed groin and biopsy site hematoma requiring multiple transfusions 4/22: Oncology started Velcade  and Cytoxan   5/2: LP performed  5/8: Trach placed.  5/29: PEG placed 6/5: Last dose Kyprolis /Cytoxan , first dose Sarclisa  6/11: Trach removed. 6/13: TDC removed for line holiday 6/16: New HD catheter placed 6/23: removed HD catheter  6/28: first cycle of chemo 7/11: IR drain placed right hip for infected hematoma  7/17: bilateral lower extremity DVTs 7/28: amputation of left arm through humerus on 7/28 7/30: port-a-cath placed 8/7: IR perform sclerotherapy of the infected hematoma-second attempt 8/14: imaging right hip suggestive right buttock pigtail catheter with fluid collection 11 x 5 cm, collection anterior right thigh incompletely evacuated 8 x 6 cm-appears relatively stable 8/15: DC right gluteal IR drain. 8/19: 3rd dose Chemo given--now await SNF in [place]   Now most issues resolved and awaiting placement in [place].     Assessment and Plan:   Multiple myeloma - Port-A-Cath placed 7/30 by IR.  Started chemo with Velcade /Cytoxan  on 08/21/2023. Chemo changed to Sarclisa /Kyprolis /Cytoxan  later on.  Managed closely by oncology.  Oncology and TOC to work on setting up outpatient chemotherapy in [place] upon discharge.  Next chemo in 2 weeks.  EPO per oncology and outpatient setting.     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  Wound VAC removed 8/1.  Dressing changes, Eliquis  per Ortho.  wash LUE incision daily  with soap and water . Change dressing daily with gauze, kerlix, ace wrap. Ok to leave open to air if no drainage.   Prescription for p.o. Dilaudid , gabapentin  200 at bedtime, Flexeril  5 3 times daily.  Infected right flank/gluteal hematoma - S/p IR  drain placement 7/11.  Drain exchanged 7/25.  Completed antibiotic course 7/26.  Drain removed 8/15.     Acute kidney injury on CKD 3A - Briefly required CRRT.  AKI resolved, TDC removed.  Creatinine mildly elevated again this morning.  Possibly a bit prerenal, decreased p.o. intake.  Responded well to IV fluids at 75ml/hr.  Encourage p.o. hydration.     Bilateral lower extremity DVT - Eliquis  on board.  Pt will be on life long anticoagulation.   Anemia of chronic renal disease - Hemoglobin stable.  EPO per oncology   Insulin -dependent diabetes mellitus - Recommend continue insulin  glargine 10 units daily along with 4 units insulin  aspart with meals along with insulin  sliding scale.   Paroxysmal atrial fibrillation - Rate well-controlled.  Continue verapamil , Eliquis .   Obstructive sleep apnea - Pt unable to tolerate CPAP mask.     Sacral Decubiti -turn q 2hr. wound care, offloading.   Goals of care -Working closely with TOC, pt, on disposition planning.  Looking SNF, bed is nearly available and auth approved.  Discharge today.   Resolved problems: Sepsis with staph epi bactermia Dysphagia Aspergillosis with PNA   Consultants: Oncology, nephrology, orthopedic surgery, interventional radiology Procedures performed: See above Disposition: Skilled nursing facility Diet recommendation:  Discharge Diet Orders (From admission, onward)     Start     Ordered   11/07/23 0000  Diet - low sodium heart healthy        11/07/23 1047           Cardiac and Carb modified diet  DISCHARGE MEDICATION: Allergies as of 11/07/2023       Reactions   Morphine  And Codeine Anaphylaxis   Shellfish Allergy Anaphylaxis   Betadine  [povidone Iodine ] Itching   Bupropion Other (See Comments)   Shaking of the body, hallucinations    Chlorhexidine  Hives   Patient reports never had issues CHG with mouth rinse.   Influenza Vaccines Hives   Metoprolol  Rash        Medication List     STOP  taking these medications    aspirin  EC 81 MG tablet   buPROPion 150 MG 24 hr tablet Commonly known as: WELLBUTRIN XL   busPIRone  15 MG tablet Commonly known as: BUSPAR    celecoxib  200 MG capsule Commonly known as: CELEBREX    clonazePAM  1 MG tablet Commonly known as: KLONOPIN    divalproex  500 MG DR tablet Commonly known as: DEPAKOTE    Fluticasone -Salmeterol 500-50 MCG/DOSE Aepb Commonly known as: ADVAIR   furosemide  40 MG tablet Commonly known as: LASIX    hydrochlorothiazide  25 MG tablet Commonly known as: HYDRODIURIL    lisinopril  40 MG tablet Commonly known as: ZESTRIL    metFORMIN  1000 MG tablet Commonly known as: GLUCOPHAGE    montelukast  10 MG tablet Commonly known as: SINGULAIR  Replaced by: montelukast  5 MG chewable tablet   OVER THE COUNTER MEDICATION   oxyCODONE  5 MG immediate release tablet Commonly known as: Roxicodone    spironolactone  25 MG tablet Commonly known as: ALDACTONE    testosterone  cypionate 200 MG/ML injection Commonly known as: DEPOTESTOSTERONE CYPIONATE   Vitamin D  (Ergocalciferol ) 1.25 MG (50000 UNIT) Caps capsule Commonly known as: DRISDOL        TAKE these medications    acyclovir  200 MG capsule  Commonly known as: ZOVIRAX  Take 2 capsules (400 mg total) by mouth 2 (two) times daily.   albuterol  108 (90 Base) MCG/ACT inhaler Commonly known as: VENTOLIN  HFA Inhale 2 puffs into the lungs every 6 (six) hours as needed for wheezing or shortness of breath.   albuterol  (2.5 MG/3ML) 0.083% nebulizer solution Commonly known as: PROVENTIL  2.5 mg every 2 (two) hours as needed for wheezing or shortness of breath.   apixaban  5 MG Tabs tablet Commonly known as: ELIQUIS  Take 1 tablet (5 mg total) by mouth 2 (two) times daily.   collagenase  250 UNIT/GM ointment Commonly known as: SANTYL  Apply topically daily. Start taking on: November 08, 2023   cyclobenzaprine  5 MG tablet Commonly known as: FLEXERIL  Take 1 tablet (5 mg total) by mouth  3 (three) times daily as needed for muscle spasms. What changed: when to take this   docusate sodium  100 MG capsule Commonly known as: COLACE Take 1 capsule (100 mg total) by mouth 2 (two) times daily.   EPINEPHrine  0.3 mg/0.3 mL Soaj injection Commonly known as: EPI-PEN Inject 0.3 mg into the muscle as needed for anaphylaxis.   esomeprazole 40 MG capsule Commonly known as: NEXIUM Take 40 mg by mouth in the morning.   gabapentin  100 MG capsule Commonly known as: NEURONTIN  Take 2 capsules (200 mg total) by mouth at bedtime. What changed:  medication strength how much to take when to take this additional instructions   hydrALAZINE  25 MG tablet Commonly known as: APRESOLINE  Take 1 tablet (25 mg total) by mouth every 8 (eight) hours. What changed:  medication strength how much to take when to take this   HYDROmorphone  2 MG tablet Commonly known as: DILAUDID  Take 1 tablet (2 mg total) by mouth every 6 (six) hours as needed for severe pain (pain score 7-10).   hydrOXYzine  10 MG tablet Commonly known as: ATARAX  Take 1 tablet (10 mg total) by mouth 3 (three) times daily as needed for anxiety.   insulin  aspart 100 UNIT/ML injection Commonly known as: novoLOG  Inject 0-15 Units into the skin 4 (four) times daily -  before meals and at bedtime.   insulin  aspart 100 UNIT/ML injection Commonly known as: novoLOG  Inject 2 Units into the skin 4 (four) times daily -  before meals and at bedtime.   insulin  glargine 100 UNIT/ML injection Commonly known as: LANTUS  Inject 0.1 mLs (10 Units total) into the skin daily. Start taking on: November 08, 2023   lidocaine  5 % Commonly known as: LIDODERM  Place 1 patch onto the skin daily. Remove & Discard patch within 12 hours or as directed by MD Start taking on: November 08, 2023   montelukast  5 MG chewable tablet Commonly known as: SINGULAIR  Chew 1 tablet (5 mg total) by mouth at bedtime. Replaces: montelukast  10 MG tablet   PARoxetine   40 MG tablet Commonly known as: PAXIL  Take 40 mg by mouth every morning.   polyethylene glycol 17 g packet Commonly known as: MIRALAX  / GLYCOLAX  Take 17 g by mouth daily as needed for mild constipation.   tamsulosin  0.4 MG Caps capsule Commonly known as: FLOMAX  Take 0.4 mg by mouth at bedtime.   umeclidinium-vilanterol 62.5-25 MCG/ACT Aepb Commonly known as: ANORO ELLIPTA  Inhale 1 puff into the lungs daily. Start taking on: November 08, 2023   valproic  acid 250 MG/5ML solution Commonly known as: DEPAKENE  Take 15 mLs (750 mg total) by mouth 2 (two) times daily.   verapamil  120 MG CR tablet Commonly known as: CALAN -SR Take  120 mg by mouth daily.               Discharge Care Instructions  (From admission, onward)           Start     Ordered   11/07/23 0000  Discharge wound care:       Comments: Cleanse wound with NS, pat dry.  Place a strip of calcium  alginate over wound bed (lawson # [id number]) and cover with silicone foam dressing.  Change daily.   11/07/23 1047            Follow-up Information     Haddix, Franky SQUIBB, MD. Schedule an appointment as soon as possible for a visit in 2 week(s).   Specialty: Orthopedic Surgery Why: Wound check left upper extremity Contact information: [address] [place] [address] [phone]                 Discharge Exam: Fredricka Weights   10/23/23 0422 10/30/23 0700 11/04/23 0424  Weight: 115 kg 112 kg 113.5 kg    GENERAL:  Alert, pleasant, no acute distress  HEENT:  EOMI CARDIOVASCULAR:  RRR, no murmurs appreciated RESPIRATORY:  Clear to auscultation, no wheezing, rales, or rhonchi GASTROINTESTINAL:  Soft, nontender, nondistended EXTREMITIES: LUE above elbow amputation with dressing. NEURO:  No new focal deficits appreciated SKIN:  No rashes noted PSYCH:  Appropriate mood and affect    Condition at discharge: improving  The results of significant diagnostics from this hospitalization (including  imaging, microbiology, ancillary and laboratory) are listed below for reference.   Imaging Studies: CT ABDOMEN PELVIS W CONTRAST Result Date: 10/31/2023 CLINICAL DATA:  Follow-up drainage catheter EXAM: CT ABDOMEN AND PELVIS WITH CONTRAST TECHNIQUE: Multidetector CT imaging of the abdomen and pelvis was performed using the standard protocol following bolus administration of intravenous contrast. RADIATION DOSE REDUCTION: This exam was performed according to the departmental dose-optimization program which includes automated exposure control, adjustment of the mA and/or kV according to patient size and/or use of iterative reconstruction technique. CONTRAST:  OMNIPAQUE  IOHEXOL  350 MG/ML SOLN COMPARISON:  10/07/2023 FINDINGS: Lower chest: Patchy bibasilar atelectasis is noted worse on the right than the left. Hepatobiliary: No focal liver abnormality is seen. No gallstones, gallbladder wall thickening, or biliary dilatation. Pancreas: Unremarkable. No pancreatic ductal dilatation or surrounding inflammatory changes. Spleen: Normal in size without focal abnormality. Adrenals/Urinary Tract: Adrenal glands are within normal limits. Kidneys demonstrate scattered cysts bilaterally. No follow-up is recommended. No renal calculi or obstructive changes are seen. The bladder is partially distended. Stomach/Bowel: Scattered fecal material is noted throughout the colon without obstructive change. The appendix is within normal limits. Small bowel and stomach are within normal limits. Vascular/Lymphatic: Aortic atherosclerosis. No enlarged abdominal or pelvic lymph nodes. Reproductive: Prostate is unremarkable. Other: No abdominal wall hernia or abnormality. No abdominopelvic ascites. Musculoskeletal: Left hip replacement is noted. Degenerative changes of lumbar spine are seen. Chronic T11 compression fracture is noted. In the right buttock a pigtail drainage catheter is identified. There remains a fluid collection which  measures approximately 11 by 5 cm in greatest transverse and AP dimensions respectively. The collection in the anterior right thigh is incompletely evaluated on this exam but measures at least 8.7 by 2.6 cm in transverse and AP dimensions respectively. This is slightly smaller seen on the prior study. IMPRESSION: Fluid collections in the anterior right thigh and right buttock. Drainage catheter remains in the right buttock. The overall appearance is relatively stable. The anterior right thigh collection  is slightly smaller than that seen on the prior exam. Patchy atelectatic changes in the bases right greater than left. Electronically Signed   By: Oneil Devonshire M.D.   On: 10/31/2023 03:27   CT HUMERUS LEFT W CONTRAST Result Date: 10/27/2023 CLINICAL DATA:  Checking for cellulitis. An area of redness. History of multiple myeloma and left upper arm amputation EXAM: CT OF THE UPPER LEFT EXTREMITY WITH CONTRAST TECHNIQUE: Multidetector CT imaging of the left upper arm was performed according to the standard protocol following intravenous contrast administration. RADIATION DOSE REDUCTION: This exam was performed according to the departmental dose-optimization program which includes automated exposure control, adjustment of the mA and/or kV according to patient size and/or use of iterative reconstruction technique. CONTRAST:  OMNIPAQUE  IOHEXOL  350 MG/ML SOLN COMPARISON:  Intraoperative images 10/13/2023, left humeral radiographs 09/15/2023 and CT of the left elbow 07/01/2022. Chest CT 07/26/2023 FINDINGS: Bones/Joint/Cartilage In correlation with prior imaging, patient underwent distal humeral hardware removal and amputation through the mid humeral shaft 10/13/2023. Grossly stable postsurgical changes within the remaining distal humeral shaft from hardware removal with associated chronic periosteal thickening. No gross bone destruction, acute fracture or dislocation identified. Advanced glenohumeral degenerative  changes with joint space narrowing, osteophytes and subchondral cyst formation in the glenoid and humeral head. Some of the cystic changes are removed from the subchondral bone and are likely secondary to the patient's multiple myeloma. Additional lytic lesions noted in the tip of the left scapula and left ribs. Small shoulder joint effusion with an ossified loose body in the axillary recess. Mild acromioclavicular degenerative changes. Ligaments Suboptimally assessed by CT. Muscles and Tendons Mild muscular atrophy. No intramuscular fluid collection or suspicious enhancement identified. Soft tissues Postsurgical changes related to recent amputation. Skin thickening along the amputation. No evidence of organized fluid collection, soft tissue emphysema or unexpected foreign body. There are multiple small solid nodules in the left lung, measuring up to 6 mm in the apex (image 50/4) and 7 mm in the lower lobe (image 113/4). Previous chest CT demonstrated widespread ground-glass opacities throughout both lungs which may have obscured these nodules. IMPRESSION: 1. Postsurgical changes related to recent amputation through the mid humeral shaft. No evidence of abscess or soft tissue emphysema. 2. No acute osseous findings. Grossly stable postsurgical changes in the distal humerus and probable sequela of multiple myeloma. No specific evidence of osteomyelitis. 3. Advanced glenohumeral degenerative changes with small shoulder joint effusion and ossified loose body in the axillary recess. 4. Multiple small solid nodules in the left lung, measuring up to 7 mm in the lower lobe. Previous chest CT demonstrated widespread ground-glass opacities throughout both lungs which may have obscured these nodules. These findings are nonspecific and potentially postinflammatory. Metastatic disease considered less likely, although not completely excluded. Suggest follow-up chest CT due to incomplete visualization. Electronically Signed   By:  Elsie Perone M.D.   On: 10/27/2023 09:46   IR SCLEROTHERAPY OF A FLUID COLLECTION Result Date: 10/23/2023 INDICATION: Right hip fluid collection continued. Previous doxycycline  sclerosis performed 10/10/2023. Output still persisting although diminished. Planned second and final attempted sclerosing fluid collection. EXAM: Drain change MEDICATIONS: The patient is currently admitted to the hospital and receiving intravenous antibiotics. The antibiotics were administered within an appropriate time frame prior to the initiation of the procedure. ANESTHESIA/SEDATION: Moderate (conscious) sedation was employed during this procedure. A total of Versed  2 mg and Fentanyl  150 mcg was administered intravenously by the radiology nurse. Total intra-service moderate Sedation Time: 25 mL Omnipaque  3  minutes. The patient's level of consciousness and vital signs were monitored continuously by radiology nursing throughout the procedure under my direct supervision. COMPLICATIONS: None immediate. PROCEDURE: Informed written consent was obtained from the patient after a thorough discussion of the procedural risks, benefits and alternatives. All questions were addressed. Maximal Sterile Barrier Technique was utilized including caps, mask, sterile gowns, sterile gloves, sterile drape, hand hygiene and skin antiseptic. A timeout was performed prior to the initiation of the procedure. With the patient in a supine position, external portion of drainage in the right flank were prepped and draped in the usual sterile. Aggressive flushing was normal saline. Significant decrease in overall size the when to previous. When all of flushing began to return mostly clear rest of pocket was evacuated. Short Amplatz wire was then advanced through the catheter and the catheter was completely patient. New 10 catheter was then advanced over the guidewire coiled more medially. Approximately 40 mL of reconstituted doxycycline  was then injected into the  catheter and the catheter was capped. Retention suture and sterile dressing applied. The catheter will be reopened to suction approximately 2 hours. That time there will be no more flushing of the catheter. IMPRESSION: Repeat sclerosing right flank cavity using combination fluoroscopy and aggressive flushing with doxycycline . Electronically Signed   By: Cordella Banner   On: 10/23/2023 16:23   IR Catheter Tube Change Result Date: 10/23/2023 INDICATION: Right hip fluid collection continued. Previous doxycycline  sclerosis performed 10/10/2023. Output still persisting although diminished. Planned second and final attempted sclerosing fluid collection. EXAM: Drain change MEDICATIONS: The patient is currently admitted to the hospital and receiving intravenous antibiotics. The antibiotics were administered within an appropriate time frame prior to the initiation of the procedure. ANESTHESIA/SEDATION: Moderate (conscious) sedation was employed during this procedure. A total of Versed  2 mg and Fentanyl  150 mcg was administered intravenously by the radiology nurse. Total intra-service moderate Sedation Time: 25 mL Omnipaque  3 minutes. The patient's level of consciousness and vital signs were monitored continuously by radiology nursing throughout the procedure under my direct supervision. COMPLICATIONS: None immediate. PROCEDURE: Informed written consent was obtained from the patient after a thorough discussion of the procedural risks, benefits and alternatives. All questions were addressed. Maximal Sterile Barrier Technique was utilized including caps, mask, sterile gowns, sterile gloves, sterile drape, hand hygiene and skin antiseptic. A timeout was performed prior to the initiation of the procedure. With the patient in a supine position, external portion of drainage in the right flank were prepped and draped in the usual sterile. Aggressive flushing was normal saline. Significant decrease in overall size the when to  previous. When all of flushing began to return mostly clear rest of pocket was evacuated. Short Amplatz wire was then advanced through the catheter and the catheter was completely patient. New 10 catheter was then advanced over the guidewire coiled more medially. Approximately 40 mL of reconstituted doxycycline  was then injected into the catheter and the catheter was capped. Retention suture and sterile dressing applied. The catheter will be reopened to suction approximately 2 hours. That time there will be no more flushing of the catheter. IMPRESSION: Repeat sclerosing right flank cavity using combination fluoroscopy and aggressive flushing with doxycycline . Electronically Signed   By: Cordella Banner   On: 10/23/2023 16:23   IR IMAGING GUIDED PORT INSERTION Result Date: 10/15/2023 INDICATION: 58 year old male with history multiple myeloma requiring central venous access for chemotherapy administration EXAM: IMPLANTED PORT A CATH PLACEMENT WITH ULTRASOUND AND FLUOROSCOPIC GUIDANCE COMPARISON:  None Available. MEDICATIONS:  None. ANESTHESIA/SEDATION: Moderate (conscious) sedation was employed during this procedure. A total of Versed  1.5 mg and Fentanyl  75 mcg was administered intravenously. Moderate Sedation Time: 14 minutes. The patient's level of consciousness and vital signs were monitored continuously by radiology nursing throughout the procedure under my direct supervision. CONTRAST:  None FLUOROSCOPY TIME:  Ten mGy reference air kerma COMPLICATIONS: None immediate. PROCEDURE: The procedure, risks, benefits, and alternatives were explained to the patient. Questions regarding the procedure were encouraged and answered. The patient understands and consents to the procedure. The right neck and chest were prepped with chlorhexidine  in a sterile fashion, and a sterile drape was applied covering the operative field. Maximum barrier sterile technique with sterile gowns and gloves were used for the procedure. A  timeout was performed prior to the initiation of the procedure. Ultrasound was used to examine the jugular vein which was compressible and free of internal echoes. A skin marker was used to demarcate the planned venotomy and port pocket incision sites. Local anesthesia was provided to these sites and the subcutaneous tunnel track with 1% lidocaine  with 1:100,000 epinephrine . A small incision was created at the jugular access site and blunt dissection was performed of the subcutaneous tissues. Under ultrasound guidance, the jugular vein was accessed with a 21 ga micropuncture needle and an 0.018 wire was inserted to the superior vena cava. Real-time ultrasound guidance was utilized for vascular access including the acquisition of a permanent ultrasound image documenting patency of the accessed vessel. A 5 Fr micopuncture set was then used, through which a 0.035 Rosen wire was passed under fluoroscopic guidance into the inferior vena cava. An 8 Fr dilator was then placed over the wire. A subcutaneous port pocket was then created along the upper chest wall utilizing a combination of sharp and blunt dissection. The pocket was irrigated with sterile saline, packed with gauze, and observed for hemorrhage. A single lumen ISP sized power injectable port was chosen for placement. The 8 Fr catheter was tunneled from the port pocket site to the venotomy incision. The port was placed in the pocket. The external catheter was trimmed to appropriate length. The dilator was exchanged for an 8 Fr peel-away sheath under fluoroscopic guidance. The catheter was then placed through the sheath and the sheath was removed. Final catheter positioning was confirmed and documented with a fluoroscopic spot radiograph. The port was accessed with a Huber needle, aspirated, and flushed with heparinized saline. The deep dermal layer of the port pocket incision was closed with interrupted 3-0 Vicryl suture. Dermabond was then placed over the  port pocket and neck incisions. The patient tolerated the procedure well without immediate post procedural complication. FINDINGS: After catheter placement, the tip lies within the superior cavoatrial junction. The catheter aspirates and flushes normally and is ready for immediate use. IMPRESSION: Successful placement of a power injectable Port-A-Cath via the right internal jugular vein. The catheter is ready for immediate use. Ester Sides, MD [facility] Electronically Signed   By: Ester Sides M.D.   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 Multiple myeloma. EXAM: LEFT HUMERUS - 2+ VIEW COMPARISON:  09/15/2023 FINDINGS: A single C-arm image demonstrates the recently demonstrated lytic changes in the distal humerus and associated fixation hardware. IMPRESSION: Single C-arm image of the distal humerus and associated fixation hardware. Electronically Signed   By: Elspeth Bathe M.D.   On: 10/13/2023 11:05  DG C-Arm 1-60 Min-No Report Result Date: 10/13/2023 Fluoroscopy was utilized by the requesting physician.  No radiographic interpretation.   DG C-Arm 1-60 Min-No Report Result Date: 10/13/2023 Fluoroscopy was utilized by the requesting physician.  No radiographic interpretation.   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 Planned sclerosing. EXAM: Sclerosing of abnormal fluid collection COMPARISON:  None Available. MEDICATIONS: 500 mg doxycycline ; intra procedural ANESTHESIA/SEDATION: Moderate (conscious) sedation was employed during this procedure. A total of Versed  2 mg and Fentanyl  100 mcg was administered intravenously by the radiology nurse. Total intra-service moderate Sedation Time: 11 minutes. The patient's level of consciousness and vital  signs were monitored continuously by radiology nursing throughout the procedure under my direct supervision. CONTRAST:  10 mL Omnipaque  300-administered into the collecting system(s) FLUOROSCOPY: Radiation Exposure Index (as provided by the fluoroscopic device): 5 mGy Kerma COMPLICATIONS: None immediate. PROCEDURE: Informed written consent was obtained from the patient after a thorough discussion of the procedural risks, benefits and alternatives. All questions were addressed. Maximal Sterile Barrier Technique was utilized including caps, mask, sterile gowns, sterile gloves, sterile drape, hand hygiene and skin antiseptic. A timeout was performed prior to the initiation of the procedure. The indwelling catheter in the right hip region prepped and draped in usual sterile fashion. The indwelling 10 French catheter was aggressively irrigated with normal saline and flushed. Dilute contrast was then injected under fluoroscopic guidance into the catheter to evaluate if the fluid collection was communicating with vascular or lymphatic structure. Contrast flows freely into the catheter and outlines the fluid collection which is irregular in appearance, however there is no vascular or lymphatic communication identified. The contrast was then mostly withdrawn. The catheter and retention sutures were cut. A new 10 French catheter was then advanced over a guidewire into the fluid collection and repositioned in a more cephalad location. Approximately 500 mg of doxycycline  was reconstituted with 50 mL of normal saline and then approximately 35 mL of this mixture was injected into the 10 French pigtail catheter. The catheter was then capped. Retention suture and sterile dressing were applied. The doxycycline  will dwell within the cavity for approximately 1 hour and then the catheter will be connected to a JP bulb and the patient will be monitored for output. IMPRESSION: Satisfactory exchange of a 10 French right hip drainage  catheter with sclerosing using doxycycline  as described above. Catheter will remain to a JP bulb until output has sufficiently diminished. Electronically Signed   By: Cordella Banner   On: 10/10/2023 17:18    Microbiology: Results for orders placed or performed during the hospital encounter of 07/03/23  MRSA Next Gen by PCR, Nasal     Status: None   Collection Time: 07/05/23 11:45 AM   Specimen: Nasal Mucosa; Nasal Swab  Result Value Ref Range Status   MRSA by PCR Next Gen NOT DETECTED NOT DETECTED Final    Comment: (NOTE) The GeneXpert MRSA Assay (FDA approved for NASAL specimens only), is one component of a comprehensive MRSA colonization surveillance program. It is not intended to diagnose MRSA infection nor to guide or monitor treatment for MRSA infections. Test performance is not FDA approved in patients less than 1 years old. Performed at [facility], [address] [place], [place] [address]   Culture, blood (Routine X 2) w Reflex to ID Panel     Status: None   Collection Time: 07/05/23 12:49 PM   Specimen: BLOOD  LEFT HAND  Result Value Ref Range Status   Specimen Description   Final    BLOOD LEFT HAND Performed at [facility], [address] [place], [place] [address]    Special Requests   Final    BOTTLES DRAWN AEROBIC AND ANAEROBIC Blood Culture results may not be optimal due to an inadequate volume of blood received in culture bottles Performed at [facility], [address], [place], [place] [address]    Culture   Final    NO GROWTH 5 DAYS Performed at [facility], [address] [place], [place] [address]    Report Status 07/10/2023 FINAL  Final  Culture, blood (Routine X 2) w Reflex to ID Panel     Status: None   Collection Time: 07/05/23 12:57 PM   Specimen: BLOOD LEFT HAND  Result Value Ref Range Status   Specimen Description   Final    BLOOD LEFT HAND Performed at [facility], [address] [place], [place] [address]    Special Requests   Final    BOTTLES DRAWN AEROBIC AND ANAEROBIC Blood Culture results may not be optimal due to an inadequate volume of blood received in culture bottles Performed at [facility], [address] [place], [place] [address]    Culture   Final    NO GROWTH 5 DAYS Performed at [facility], [address], [place], [place] [address]    Report Status 07/10/2023 FINAL  Final  Urine Culture (for pregnant, neutropenic or urologic patients or patients with an indwelling urinary catheter)     Status: None   Collection Time: 07/05/23  7:13 PM   Specimen: Urine, Clean Catch  Result Value Ref Range Status   Specimen Description   Final    URINE, CLEAN CATCH Performed at [facility], [address] [place], [place] [address]    Special Requests   Final    NONE Performed at [facility], [address], [place], [place] [address]    Culture   Final    NO GROWTH Performed at [facility], [address] [place], [place] [address]    Report Status 07/06/2023 FINAL  Final  Culture, Respiratory w Gram Stain     Status: None   Collection Time: 07/09/23  8:50 AM   Specimen: Tracheal Aspirate; Respiratory  Result Value Ref Range Status   Specimen Description   Final    TRACHEAL ASPIRATE Performed at [facility], [address], [place], [place] [address]    Special Requests   Final    NONE Performed at [facility], [address], [place], [place] [address]    Gram Stain   Final    ABUNDANT WBC PRESENT, PREDOMINANTLY PMN RARE GRAM POSITIVE COCCI IN PAIRS Performed at [facility], [address] [place] [address], [place], [place] [address]    Culture FEW STAPHYLOCOCCUS EPIDERMIDIS  Final   Report Status 07/11/2023 FINAL  Final   Organism ID, Bacteria STAPHYLOCOCCUS EPIDERMIDIS  Final      Susceptibility   Staphylococcus epidermidis - MIC*     CIPROFLOXACIN 1 SENSITIVE Sensitive     ERYTHROMYCIN >=8 RESISTANT Resistant     GENTAMICIN 1 SENSITIVE Sensitive     OXACILLIN >=4 RESISTANT Resistant     TETRACYCLINE >=16 RESISTANT Resistant     VANCOMYCIN  2 SENSITIVE Sensitive     TRIMETH/SULFA >=320 RESISTANT  Resistant     CLINDAMYCIN >=8 RESISTANT Resistant     RIFAMPIN <=0.5 SENSITIVE Sensitive     Inducible Clindamycin NEGATIVE Sensitive     * FEW STAPHYLOCOCCUS EPIDERMIDIS  Culture, blood (Routine X 2) w Reflex to ID Panel     Status: None   Collection Time: 07/14/23  9:10 PM   Specimen: BLOOD RIGHT HAND  Result Value Ref Range Status   Specimen Description   Final    BLOOD RIGHT HAND Performed at [facility], [address], [place], [place] [address]    Special Requests   Final    BOTTLES DRAWN AEROBIC ONLY Blood Culture results may not be optimal due to an inadequate volume of blood received in culture bottles Performed at [facility], [address], [place], [place] [address]    Culture   Final    NO GROWTH 5 DAYS Performed at [facility], [address] [place], [place] [address]    Report Status 07/19/2023 FINAL  Final  Culture, blood (Routine X 2) w Reflex to ID Panel     Status: None   Collection Time: 07/14/23  9:10 PM   Specimen: BLOOD  Result Value Ref Range Status   Specimen Description   Final    BLOOD LEFT ANTECUBITAL Performed at [facility], [address] [place], [place] [address]    Special Requests   Final    BOTTLES DRAWN AEROBIC AND ANAEROBIC Blood Culture adequate volume Performed at [facility], [address] [place], [place] [address]    Culture   Final    NO GROWTH 5 DAYS Performed at [facility], [address], [place], [place] [address]    Report Status 07/19/2023 FINAL  Final  Culture, Respiratory w Gram Stain     Status: None   Collection Time: 07/16/23  2:54 PM   Specimen: Tracheal Aspirate;  Respiratory  Result Value Ref Range Status   Specimen Description   Final    TRACHEAL ASPIRATE Performed at [facility], [address], [place], [place] [address]    Special Requests   Final    NONE Performed at [facility], [address] [place], [place] [address]    Gram Stain   Final    FEW WBC PRESENT, PREDOMINANTLY PMN RARE BUDDING YEAST SEEN ABUNDANT GRAM POSITIVE COCCI IN PAIRS Performed at [facility], [address] [place], [place] [address]    Culture RARE ASPERGILLUS FUMIGATUS  Final   Report Status 07/19/2023 FINAL  Final  CSF culture w Gram Stain     Status: None   Collection Time: 07/18/23  1:32 PM   Specimen: PATH Cytology CSF; Cerebrospinal Fluid  Result Value Ref Range Status   Specimen Description   Final    CSF Performed at [facility], [address], [place], [place] [address]    Special Requests   Final    Immunocompromised Performed at [facility], [address] [place], [place] [address]    Gram Stain   Final    NO ORGANISMS SEEN RARE WBC PRESENT, PREDOMINANTLY PMN Gram Stain Report Called to,Read Back By and Verified With: RN H [facility] AT 1443 07/18/23 CRUICKSHANK A GRAM STAIN REVIEWED-AGREE WITH RESULT DRT    Culture   Final    NO GROWTH 3 DAYS Performed at [facility], [address] [place] [address] [place], [place] [address]  Report Status 07/22/2023 FINAL  Final  Culture, Respiratory w Gram Stain     Status: None   Collection Time: 07/20/23 11:38 AM   Specimen: Tracheal Aspirate; Respiratory  Result Value Ref Range Status   Specimen Description   Final    TRACHEAL ASPIRATE Performed at [facility], [address], [place], [place] [address]    Special Requests   Final    NONE Performed at [facility], [address], [place], [place] [address]    Gram Stain   Final    ABUNDANT WBC PRESENT, PREDOMINANTLY  PMN FEW SQUAMOUS EPITHELIAL CELLS PRESENT FEW GRAM NEGATIVE RODS FEW YEAST RARE GRAM POSITIVE COCCI    Culture   Final    RARE MOLD ISOLATE REFERRED FOR IDENTIFICATION [facility] SEE SEPARATE REPORT Performed at [facility], [address], [place], [place] [address]    Report Status 08/03/2023 FINAL  Final  Culture, blood (Routine X 2) w Reflex to ID Panel     Status: None   Collection Time: 07/21/23  1:27 AM   Specimen: BLOOD  Result Value Ref Range Status   Specimen Description   Final    BLOOD BLOOD LEFT HAND Performed at [facility], [address] [place], [place] [address]    Special Requests   Final    BOTTLES DRAWN AEROBIC ONLY Blood Culture results may not be optimal due to an inadequate volume of blood received in culture bottles Performed at [facility], [address], [place], [place] [address]    Culture   Final    NO GROWTH 5 DAYS Performed at [facility], [address] [place], [place] [address]    Report Status 07/26/2023 FINAL  Final  Culture, blood (Routine X 2) w Reflex to ID Panel     Status: None   Collection Time: 07/21/23  1:27 AM   Specimen: BLOOD  Result Value Ref Range Status   Specimen Description   Final    BLOOD BLOOD LEFT HAND Performed at [facility], [address] [place], [place] [address]    Special Requests   Final    BOTTLES DRAWN AEROBIC ONLY Blood Culture results may not be optimal due to an inadequate volume of blood received in culture bottles Performed at [facility], [address], [place], [place] [address]    Culture   Final    NO GROWTH 5 DAYS Performed at [facility], [address] [place], [place] [address]    Report Status 07/26/2023 FINAL  Final  MRSA Next Gen by PCR, Nasal     Status: None   Collection Time: 07/21/23  8:08 AM   Specimen: Nasal Mucosa; Nasal Swab  Result Value Ref Range Status   MRSA  by PCR Next Gen NOT DETECTED NOT DETECTED Final    Comment: (NOTE) The GeneXpert MRSA Assay (FDA approved for NASAL specimens only), is one component of a comprehensive MRSA colonization surveillance program. It is not intended to diagnose MRSA infection nor to guide or monitor treatment for MRSA infections. Test performance is not FDA approved in patients less than 32 years old. Performed at [facility], [address] [place], [place] [address]   Pneumocystis smear by DFA     Status: None   Collection Time: 07/21/23  3:41 PM   Specimen: Tracheal Aspirate; Respiratory  Result Value Ref Range Status   Specimen  Source-PJSRC TRACHEAL ASPIRATE  Final   Pneumocystis jiroveci Ag See Scanned report in [place] Link  Final    Comment: Performed at [facility], [address] [place], [place] [address]  Culture, Respiratory w Gram Stain     Status: None   Collection Time: 07/25/23  9:55 AM   Specimen: Tracheal Aspirate; Respiratory  Result Value Ref Range Status   Specimen Description   Final    TRACHEAL ASPIRATE Performed at [facility], [address] [place], [place] [address]    Special Requests   Final    NONE Performed at [facility], [address], [place], [place] [address]    Gram Stain   Final    NO WBC SEEN ABUNDANT GRAM POSITIVE COCCI FEW BUDDING YEAST SEEN    Culture   Final    ABUNDANT STAPHYLOCOCCUS HAEMOLYTICUS MOLD SEE SEPARATE REPORT Performed at [facility], [address], [place], [place] [address]    Report Status 08/04/2023 FINAL  Final   Organism ID, Bacteria STAPHYLOCOCCUS HAEMOLYTICUS  Final      Susceptibility   Staphylococcus haemolyticus - MIC*    CIPROFLOXACIN >=8 RESISTANT Resistant     ERYTHROMYCIN >=8 RESISTANT Resistant     GENTAMICIN >=16 RESISTANT Resistant     OXACILLIN >=4 RESISTANT Resistant     TETRACYCLINE >=16 RESISTANT Resistant      VANCOMYCIN  2 SENSITIVE Sensitive     TRIMETH/SULFA <=10 SENSITIVE Sensitive     CLINDAMYCIN <=0.25 SENSITIVE Sensitive     RIFAMPIN >=32 RESISTANT Resistant     Inducible Clindamycin NEGATIVE Sensitive     * ABUNDANT STAPHYLOCOCCUS HAEMOLYTICUS  Culture, blood (Routine X 2) w Reflex to ID Panel     Status: None   Collection Time: 07/27/23  5:30 PM   Specimen: BLOOD  Result Value Ref Range Status   Specimen Description BLOOD SITE NOT SPECIFIED  Final   Special Requests   Final    BOTTLES DRAWN AEROBIC AND ANAEROBIC Blood Culture results may not be optimal due to an inadequate volume of blood received in culture bottles   Culture   Final    NO GROWTH 5 DAYS Performed at [facility], [address], [place], [place] [address]    Report Status 08/01/2023 FINAL  Final  Culture, blood (Routine X 2) w Reflex to ID Panel     Status: None   Collection Time: 07/27/23  5:30 PM   Specimen: BLOOD  Result Value Ref Range Status   Specimen Description BLOOD SITE NOT SPECIFIED  Final   Special Requests   Final    BOTTLES DRAWN AEROBIC AND ANAEROBIC Blood Culture results may not be optimal due to an inadequate volume of blood received in culture bottles   Culture   Final    NO GROWTH 5 DAYS Performed at [facility], [address], [place], [place] [address]    Report Status 08/01/2023 FINAL  Final  Culture, Respiratory w Gram Stain     Status: None   Collection Time: 07/31/23  9:38 AM   Specimen: Tracheal Aspirate; Respiratory  Result Value Ref Range Status   Specimen Description TRACHEAL ASPIRATE  Final   Special Requests NONE  Final   Gram Stain   Final    RARE WBC PRESENT, PREDOMINANTLY PMN NO ORGANISMS SEEN    Culture   Final    FEW Normal respiratory flora-no Staph aureus or Pseudomonas seen Performed at  [facility], [address] [place], [place] [address]    Report Status 08/02/2023 FINAL  Final  Urine Culture     Status: None   Collection Time: 08/23/23   6:18 AM   Specimen: Urine, Random  Result Value Ref Range Status   Specimen Description URINE, RANDOM  Final   Special Requests NONE Reflexed from [id number]  Final   Culture   Final    NO GROWTH Performed at [facility], [address] [place], [place] [address]    Report Status 08/24/2023 FINAL  Final  Culture, blood (Routine X 2) w Reflex to ID Panel     Status: None   Collection Time: 08/23/23  9:35 AM   Specimen: BLOOD LEFT HAND  Result Value Ref Range Status   Specimen Description BLOOD LEFT HAND  Final   Special Requests   Final    BOTTLES DRAWN AEROBIC ONLY Blood Culture adequate volume   Culture   Final    NO GROWTH 5 DAYS Performed at [facility], [address], [place], [place] [address]    Report Status 08/28/2023 FINAL  Final  Culture, blood (Routine X 2) w Reflex to ID Panel     Status: Abnormal   Collection Time: 08/23/23  9:35 AM   Specimen: BLOOD RIGHT ARM  Result Value Ref Range Status   Specimen Description BLOOD RIGHT ARM  Final   Special Requests   Final    BOTTLES DRAWN AEROBIC AND ANAEROBIC Blood Culture adequate volume   Culture  Setup Time   Final    GRAM POSITIVE COCCI IN TETRADS AEROBIC BOTTLE ONLY CRITICAL RESULT CALLED TO, READ BACK BY AND VERIFIED WITH: PHARMD T. DANG [id number] AT 1330 BY EC    Culture (A)  Final    STAPHYLOCOCCUS EPIDERMIDIS THE SIGNIFICANCE OF ISOLATING THIS ORGANISM FROM A SINGLE SET OF BLOOD CULTURES WHEN MULTIPLE SETS ARE DRAWN IS UNCERTAIN. PLEASE NOTIFY THE MICROBIOLOGY DEPARTMENT WITHIN ONE WEEK IF SPECIATION AND SENSITIVITIES ARE REQUIRED. Performed at [facility], [address], [place], [place] [address]    Report Status 08/25/2023 FINAL  Final  Blood Culture ID Panel (Reflexed)     Status: Abnormal   Collection Time: 08/23/23  9:35 AM  Result Value Ref Range Status   Enterococcus faecalis NOT DETECTED NOT DETECTED Final   Enterococcus Faecium NOT DETECTED NOT DETECTED Final   Listeria  monocytogenes NOT DETECTED NOT DETECTED Final   Staphylococcus species DETECTED (A) NOT DETECTED Final    Comment: CRITICAL RESULT CALLED TO, READ BACK BY AND VERIFIED WITH: PHARMD T. DANG [id number] AT 1330 BY EC    Staphylococcus aureus (BCID) NOT DETECTED NOT DETECTED Final   Staphylococcus epidermidis DETECTED (A) NOT DETECTED Final    Comment: Methicillin (oxacillin) resistant coagulase negative staphylococcus. Possible blood culture contaminant (unless isolated from more than one blood culture draw or clinical case suggests pathogenicity). No antibiotic treatment is indicated for blood  culture contaminants. CRITICAL RESULT CALLED TO, READ BACK BY AND VERIFIED WITH: PHARMD T. DANG [id number] AT 1330 BY EC    Staphylococcus lugdunensis NOT DETECTED NOT DETECTED Final   Streptococcus species NOT DETECTED NOT DETECTED Final   Streptococcus agalactiae NOT DETECTED NOT DETECTED Final   Streptococcus pneumoniae NOT DETECTED NOT DETECTED Final   Streptococcus pyogenes NOT DETECTED NOT DETECTED Final   A.calcoaceticus-baumannii NOT DETECTED NOT DETECTED Final   Bacteroides fragilis NOT DETECTED NOT DETECTED Final   Enterobacterales NOT DETECTED NOT  DETECTED Final   Enterobacter cloacae complex NOT DETECTED NOT DETECTED Final   Escherichia coli NOT DETECTED NOT DETECTED Final   Klebsiella aerogenes NOT DETECTED NOT DETECTED Final   Klebsiella oxytoca NOT DETECTED NOT DETECTED Final   Klebsiella pneumoniae NOT DETECTED NOT DETECTED Final   Proteus species NOT DETECTED NOT DETECTED Final   Salmonella species NOT DETECTED NOT DETECTED Final   Serratia marcescens NOT DETECTED NOT DETECTED Final   Haemophilus influenzae NOT DETECTED NOT DETECTED Final   Neisseria meningitidis NOT DETECTED NOT DETECTED Final   Pseudomonas aeruginosa NOT DETECTED NOT DETECTED Final   Stenotrophomonas maltophilia NOT DETECTED NOT DETECTED Final   Candida albicans NOT DETECTED NOT DETECTED Final   Candida auris NOT  DETECTED NOT DETECTED Final   Candida glabrata NOT DETECTED NOT DETECTED Final   Candida krusei NOT DETECTED NOT DETECTED Final   Candida parapsilosis NOT DETECTED NOT DETECTED Final   Candida tropicalis NOT DETECTED NOT DETECTED Final   Cryptococcus neoformans/gattii NOT DETECTED NOT DETECTED Final   Methicillin resistance mecA/C DETECTED (A) NOT DETECTED Final    Comment: CRITICAL RESULT CALLED TO, READ BACK BY AND VERIFIED WITH: PHARMD T. DANG [id number] AT 1330 BY EC Performed at [facility], [address] [place], [place] [address]   Culture, blood (Routine X 2) w Reflex to ID Panel     Status: None   Collection Time: 08/25/23 11:09 AM   Specimen: BLOOD RIGHT ARM  Result Value Ref Range Status   Specimen Description BLOOD RIGHT ARM  Final   Special Requests   Final    BOTTLES DRAWN AEROBIC ONLY Blood Culture adequate volume   Culture   Final    NO GROWTH 5 DAYS Performed at [facility], [address], [place], [place] [address]    Report Status 08/30/2023 FINAL  Final  Culture, blood (Routine X 2) w Reflex to ID Panel     Status: None   Collection Time: 08/25/23 11:10 AM   Specimen: BLOOD LEFT HAND  Result Value Ref Range Status   Specimen Description BLOOD LEFT HAND  Final   Special Requests   Final    BOTTLES DRAWN AEROBIC ONLY Blood Culture results may not be optimal due to an inadequate volume of blood received in culture bottles   Culture   Final    NO GROWTH 5 DAYS Performed at [facility], [address] [place], [place] [address]    Report Status 08/30/2023 FINAL  Final  Aerobic/Anaerobic Culture w Gram Stain (surgical/deep wound)     Status: None   Collection Time: 09/26/23 10:41 AM   Specimen: Abscess  Result Value Ref Range Status   Specimen Description ABSCESS  Final   Special Requests NONE  Final   Gram Stain NO WBC SEEN NO ORGANISMS SEEN   Final   Culture   Final    No growth aerobically or anaerobically. Performed at [facility], [address], [place], [place] [address]    Report Status 10/01/2023 FINAL  Final  Aerobic/Anaerobic Culture w Gram Stain (surgical/deep wound)     Status: None   Collection Time: 10/13/23  8:30 AM   Specimen: PATH Benign ortho; Tissue  Result Value Ref Range Status   Specimen Description TISSUE  Final   Special Requests A  Final   Gram Stain NO WBC SEEN NO ORGANISMS SEEN   Final   Culture   Final    No growth aerobically  or anaerobically. Performed at [facility], [address] [place], [place] [address]    Report Status 10/18/2023 FINAL  Final  Aerobic/Anaerobic Culture w Gram Stain (surgical/deep wound)     Status: None   Collection Time: 10/13/23  8:36 AM   Specimen: PATH Benign ortho; Tissue  Result Value Ref Range Status   Specimen Description TISSUE  Final   Special Requests B  Final   Gram Stain NO WBC SEEN NO ORGANISMS SEEN   Final   Culture   Final    No growth aerobically or anaerobically. Performed at [facility], [address], [place], [place] [address]    Report Status 10/18/2023 FINAL  Final    Labs: CBC: Recent Labs  Lab 11/01/23 0351 11/04/23 0404 11/05/23 0324 11/06/23 0309 11/07/23 0437  WBC 3.6* 3.7* 5.9 2.5* 2.3*  NEUTROABS  --  2.1 4.4 1.3* 1.1*  HGB 8.9* 9.2* 8.8* 9.5* 9.4*  HCT 28.1* 29.7* 27.8* 30.4* 29.7*  MCV 96.9 99.3 99.3 98.7 98.3  PLT 320 331 277 254 245   Basic Metabolic Panel: Recent Labs  Lab 11/03/23 0351 11/04/23 0404 11/05/23 0324 11/06/23 0309 11/07/23 0437  NA 137 135 135 136 139  K 4.5 4.1 3.9 4.4 4.4  CL 104 106 105 107 110  CO2 26 25 23 23 24   GLUCOSE 101* 98 124* 81 86  BUN 51* 42* 40* 45* 41*  CREATININE 1.37* 1.28* 1.53* 1.57* 1.43*  CALCIUM  8.5* 8.4* 7.9* 8.2* 8.5*  MG  --   --   --   --  2.1   Liver Function Tests: Recent Labs  Lab 11/01/23 0351 11/04/23 0404 11/05/23 0324 11/06/23 0309 11/07/23 0437  AST 9* 8* 9* 10* 11*  ALT 7 7 6 7 7   ALKPHOS 58 59 55 52 48   BILITOT 0.5 0.4 0.6 0.3 0.4  PROT 4.7* 4.9* 4.6* 4.8* 4.8*  ALBUMIN  2.2* 2.2* 2.0* 2.0* 2.0*   CBG: Recent Labs  Lab 11/06/23 0754 11/06/23 1137 11/06/23 1634 11/06/23 2143 11/07/23 0734  GLUCAP 86 131* 104* 126* 90    Discharge time spent: 45 minutes.  Length of inpatient stay: 127 days  Signed: Carliss LELON Canales, DO [facility] 11/07/2023

## 2023-11-07 NOTE — TOC Progression Note (Signed)
 Transition of Care (TOC) - Progression Note    Patient Details  Name: Frank [name]. MRN: [id number] Date of Birth: [birth year]  Transition of Care [facility]) CM/SW Contact  Lauraine FORBES Saa, LCSWA Phone Number: 11/07/2023, 9:15 AM  Clinical Narrative:     9:15 AM Patient's SNF insurance authorization was approved and is valid  11/07/2023-11/11/2023. CSW relayed information to [facility] and informed them of patient's expected discharge tomorrow.  Expected Discharge Plan: Skilled Nursing Facility Barriers to Discharge: Continued Medical Work up, English as a second language teacher               Expected Discharge Plan and Services In-house Referral: Clinical Social Work Discharge Planning Services: [employer] Consult Post Acute Care Choice: Skilled Nursing Facility Living arrangements for the past 2 months: Single Family Home                 DME Arranged: N/A DME Agency: NA       HH Arranged: NA HH Agency: NA         Social Drivers of Health (SDOH) Interventions SDOH Screenings   Food Insecurity: No Food Insecurity (07/05/2023)  Housing: Low Risk  (07/05/2023)  Transportation Needs: No Transportation Needs (07/05/2023)  Utilities: Not At Risk (07/05/2023)  Tobacco Use: High Risk (10/13/2023)    Readmission Risk Interventions     No data to display

## 2023-11-07 NOTE — Progress Notes (Signed)
 Called for report to [facility] and spoke with Norman. All questions were answered. Awaiting PTAR for transport.

## 2023-11-07 NOTE — Progress Notes (Signed)
 PT Cancellation Note  Patient Details Name: Frank [name]. MRN: [id number] DOB: [birth year]   Cancelled Treatment:    Reason Eval/Treat Not Completed: (P) Patient declined, no reason specified Pt reports feeling like I was run over by a [employer] truck. PT will follow back later this afternoon as able.   Frank [name] B. Fleeta Lapidus PT, DPT Acute Rehabilitation Services Please use secure chat or  Call Office [phone]    Frank [name] 11/07/2023, 11:42 AM

## 2023-11-07 NOTE — Plan of Care (Signed)
  Problem: Health Behavior/Discharge Planning: Goal: Ability to manage health-related needs will improve Outcome: Progressing   Problem: Clinical Measurements: Goal: Will remain free from infection Outcome: Progressing   Problem: Clinical Measurements: Goal: Diagnostic test results will improve Outcome: Progressing   Problem: Skin Integrity: Goal: Risk for impaired skin integrity will decrease Outcome: Progressing   Problem: Safety: Goal: Ability to remain free from injury will improve Outcome: Progressing

## 2023-11-07 NOTE — Progress Notes (Signed)
 Hopefully, Frank [name] will be gone up to rehab in [place]  tomorrow.  He feels good.  He is eating well.  There is no nausea or vomiting.  He is doing more in the way of physical therapy.  He is having bowel movements.  He is not having any fever.  There is no bleeding.  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  He has tolerated chemotherapy incredibly well.  This is worked also incredibly well.  His myeloma levels have come down probably close to 90%.  His labs show sodium 139.  Potassium 4.4.  BUN 41 creatinine 1.43.  Calcium  8.5 with an albumin  of 2.0.  His white cell count is 2.3.  Hemoglobin 9.4.  Platelet count 245,000.  He has had no rashes.  He does have a sore on the backside from being in bed.  He is doing pretty well with pain.  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   Vital signs show temperature 98.8.  Pulse 80.  Blood pressure 137/94.  His head and neck exam shows no ocular or oral lesions.  He has no adenopathy in the neck.  He has no mucositis.  Lungs are clear bilaterally.  He has good air movement bilaterally.  Cardiac exam regular rate and rhythm.  There may be an occasional extra beat.  Abdomen is soft.  Bowel sounds are present.  He has no fluid wave.  There is no palpable liver or spleen tip.  Extremities shows no clubbing, cyanosis or edema.  He has decent strength and range of motion of his legs.  He has the healing left upper extremity amputation.  Neurological exam shows no focal neurological deficits.  Again, Frank [name] has IgG kappa myeloma.  He had incredibly high protein level when he came in.  Chemotherapy has improved his myeloma tremendously.  I think his next cycle is due in 2 weeks.  He can have this up.  [place] .  I know that he has had incredible care from everybody up on [facility]  Frank Crease, MD  Frank [name] 6:33

## 2023-11-07 NOTE — TOC Transition Note (Signed)
 Transition of Care [facility]) - Discharge Note   Patient Details  Name: Frank [name]. MRN: [id number] Date of Birth: 24-Jan-1966  Transition of Care [facility]) CM/SW Contact:  Lauraine FORBES Saa, LCSWA Phone Number: 11/07/2023, 12:09 PM   Clinical Narrative:     Patient will DC to: [facility] [place] Anticipated DC date: 11/07/2023 Family notified: [place] Albers; Significant Other; [phone] Transport by: [place]   Per MD patient ready for DC to [facility]. RN to call report prior to discharge [phone]). RN, patient, patient's family, and facility notified of DC. Discharge Summary and FL2 sent to facility. DC packet on chart. Ambulance transport requested for patient at 12:02.   CSW will sign off for now as social work intervention is no longer needed. Please consult us  again if new needs arise.    Final next level of care: Skilled Nursing Facility Barriers to Discharge: Barriers Resolved   Patient Goals and CMS Choice Patient states their goals for this hospitalization and ongoing recovery are:: SNF in [place], [place] CMS Medicare.gov Compare Post Acute Care list provided to:: Patient Choice offered to / list presented to : Patient      Discharge Placement              Patient chooses bed at: [facility] Patient to be transferred to facility by: PTAR Name of family member notified: Garett Tetzloff; Significant Other; [phone] Patient and family notified of of transfer: 11/07/23  Discharge Plan and Services Additional resources added to the After Visit Summary for   In-house Referral: Clinical Social Work Discharge Planning Services: CM Consult Post Acute Care Choice: Skilled Nursing Facility          DME Arranged: N/A DME Agency: NA       HH Arranged: NA HH Agency: NA        Social Drivers of Health (SDOH) Interventions SDOH Screenings   Food Insecurity: No Food Insecurity  (07/05/2023)  Housing: Low Risk  (07/05/2023)  Transportation Needs: No Transportation Needs (07/05/2023)  Utilities: Not At Risk (07/05/2023)  Tobacco Use: High Risk (10/13/2023)     Readmission Risk Interventions     No data to display

## 2023-11-07 NOTE — Progress Notes (Signed)
 OT Cancellation Note  Patient Details Name: Frank [name]. MRN: [id number] DOB: 09-11-1965   Cancelled Treatment:    Reason Eval/Treat Not Completed: Patient declined, no reason specified (Patient stating that he is not feeling well and hurt all over and could not participate today. OT to reattempt later today as schedule permits)  [name] 11/07/2023, 11:49 AM  [name], OTA Acute Rehabilitation Services  Office [phone]

## 2023-11-25 ENCOUNTER — Encounter: Payer: Self-pay | Admitting: Oncology

## 2023-11-25 ENCOUNTER — Encounter (HOSPITAL_COMMUNITY): Payer: Self-pay | Admitting: Oncology

## 2023-11-25 ENCOUNTER — Other Ambulatory Visit: Payer: Self-pay | Admitting: Oncology

## 2023-11-25 NOTE — Progress Notes (Signed)
 Oncology progress note:  I reviewed the chart. Patient is a 58 year old male with multiple myeloma currently living in SNF.  The patient was admitted inpatient and was started on Isatuximab  Kyprolis  and Cytoxan .  Cytoxan  probably started for the ease of use while awaiting for Revlimid and patient.  Patient is transitioning to our care outpatient at this time.  Patient can be transitioned to Isatuximab  Kyprolis  and Revlimid with next cycle. I will evaluate the patient prior to this treatment.  Mickiel Dry, MD Hematology/Oncology [facility]

## 2023-11-26 ENCOUNTER — Other Ambulatory Visit: Payer: Self-pay

## 2023-12-01 ENCOUNTER — Encounter (HOSPITAL_COMMUNITY): Payer: Self-pay | Admitting: Oncology

## 2023-12-02 NOTE — Progress Notes (Signed)
 Pharmacist Chemotherapy Monitoring - Initial Assessment    Anticipated start date: 12/04/23   The following has been reviewed per standard work regarding the patient's treatment regimen: The patient's diagnosis, treatment plan and drug doses, and organ/hematologic function Lab orders and baseline tests specific to treatment regimen  The treatment plan start date, drug sequencing, and pre-medications Prior authorization status  Patient's documented medication list, including drug-drug interaction screen and prescriptions for anti-emetics and supportive care specific to the treatment regimen The drug concentrations, fluid compatibility, administration routes, and timing of the medications to be used The patient's access for treatment and lifetime cumulative dose history, if applicable  The patient's medication allergies and previous infusion related reactions, if applicable   Changes made to treatment plan:  treatment plan date  Follow up needed:  N/A  PA approved per Angie Per Dr Davonna she is planning on dc'ing the Cytoxan  and adding Revlimid  to his treatment plan.  She will decide with her OV that day.  Labs added to the plan as they were not entered:  CMP, CBC, and Magnesium .  Type and Screen done at [facility] with first dose inpatient.  Cycles 1 and 2 given inpatient.   Frank [name], RPH, 12/02/2023  11:31 AM

## 2023-12-04 ENCOUNTER — Telehealth: Payer: Self-pay | Admitting: Pharmacist

## 2023-12-04 ENCOUNTER — Other Ambulatory Visit (HOSPITAL_COMMUNITY): Payer: Self-pay

## 2023-12-04 ENCOUNTER — Encounter (HOSPITAL_COMMUNITY): Payer: Self-pay | Admitting: Oncology

## 2023-12-04 ENCOUNTER — Inpatient Hospital Stay: Attending: Oncology

## 2023-12-04 ENCOUNTER — Inpatient Hospital Stay

## 2023-12-04 ENCOUNTER — Inpatient Hospital Stay (HOSPITAL_BASED_OUTPATIENT_CLINIC_OR_DEPARTMENT_OTHER): Admitting: Oncology

## 2023-12-04 ENCOUNTER — Telehealth: Payer: Self-pay | Admitting: Pharmacy Technician

## 2023-12-04 VITALS — BP 138/112 | HR 91 | Temp 97.7°F | Resp 20 | Ht 72.0 in | Wt 226.0 lb

## 2023-12-04 DIAGNOSIS — Z79620 Long term (current) use of immunosuppressive biologic: Secondary | ICD-10-CM | POA: Insufficient documentation

## 2023-12-04 DIAGNOSIS — Z8701 Personal history of pneumonia (recurrent): Secondary | ICD-10-CM | POA: Insufficient documentation

## 2023-12-04 DIAGNOSIS — M1990 Unspecified osteoarthritis, unspecified site: Secondary | ICD-10-CM | POA: Insufficient documentation

## 2023-12-04 DIAGNOSIS — C9000 Multiple myeloma not having achieved remission: Secondary | ICD-10-CM | POA: Diagnosis present

## 2023-12-04 DIAGNOSIS — S42302A Unspecified fracture of shaft of humerus, left arm, initial encounter for closed fracture: Secondary | ICD-10-CM | POA: Diagnosis not present

## 2023-12-04 DIAGNOSIS — F319 Bipolar disorder, unspecified: Secondary | ICD-10-CM | POA: Diagnosis not present

## 2023-12-04 DIAGNOSIS — D696 Thrombocytopenia, unspecified: Secondary | ICD-10-CM | POA: Insufficient documentation

## 2023-12-04 DIAGNOSIS — E1165 Type 2 diabetes mellitus with hyperglycemia: Secondary | ICD-10-CM | POA: Diagnosis not present

## 2023-12-04 DIAGNOSIS — J9811 Atelectasis: Secondary | ICD-10-CM | POA: Insufficient documentation

## 2023-12-04 DIAGNOSIS — R109 Unspecified abdominal pain: Secondary | ICD-10-CM | POA: Diagnosis not present

## 2023-12-04 DIAGNOSIS — Z79899 Other long term (current) drug therapy: Secondary | ICD-10-CM | POA: Insufficient documentation

## 2023-12-04 DIAGNOSIS — A419 Sepsis, unspecified organism: Secondary | ICD-10-CM | POA: Insufficient documentation

## 2023-12-04 DIAGNOSIS — M898X9 Other specified disorders of bone, unspecified site: Secondary | ICD-10-CM

## 2023-12-04 DIAGNOSIS — Z87442 Personal history of urinary calculi: Secondary | ICD-10-CM | POA: Diagnosis not present

## 2023-12-04 DIAGNOSIS — Z992 Dependence on renal dialysis: Secondary | ICD-10-CM | POA: Insufficient documentation

## 2023-12-04 DIAGNOSIS — G893 Neoplasm related pain (acute) (chronic): Secondary | ICD-10-CM

## 2023-12-04 DIAGNOSIS — D649 Anemia, unspecified: Secondary | ICD-10-CM | POA: Diagnosis not present

## 2023-12-04 DIAGNOSIS — Z885 Allergy status to narcotic agent status: Secondary | ICD-10-CM | POA: Insufficient documentation

## 2023-12-04 DIAGNOSIS — J9690 Respiratory failure, unspecified, unspecified whether with hypoxia or hypercapnia: Secondary | ICD-10-CM | POA: Insufficient documentation

## 2023-12-04 DIAGNOSIS — Z887 Allergy status to serum and vaccine status: Secondary | ICD-10-CM | POA: Insufficient documentation

## 2023-12-04 DIAGNOSIS — R200 Anesthesia of skin: Secondary | ICD-10-CM | POA: Diagnosis not present

## 2023-12-04 DIAGNOSIS — Z87891 Personal history of nicotine dependence: Secondary | ICD-10-CM | POA: Insufficient documentation

## 2023-12-04 DIAGNOSIS — S8010XA Contusion of unspecified lower leg, initial encounter: Secondary | ICD-10-CM | POA: Diagnosis not present

## 2023-12-04 DIAGNOSIS — D709 Neutropenia, unspecified: Secondary | ICD-10-CM | POA: Insufficient documentation

## 2023-12-04 DIAGNOSIS — Z5112 Encounter for antineoplastic immunotherapy: Secondary | ICD-10-CM | POA: Diagnosis present

## 2023-12-04 DIAGNOSIS — Z888 Allergy status to other drugs, medicaments and biological substances status: Secondary | ICD-10-CM | POA: Insufficient documentation

## 2023-12-04 LAB — COMPREHENSIVE METABOLIC PANEL WITH GFR
ALT: 8 U/L (ref 0–44)
AST: <10 U/L — ABNORMAL LOW (ref 15–41)
Albumin: 2.6 g/dL — ABNORMAL LOW (ref 3.5–5.0)
Alkaline Phosphatase: 55 U/L (ref 38–126)
Anion gap: 11 (ref 5–15)
BUN: 19 mg/dL (ref 6–20)
CO2: 26 mmol/L (ref 22–32)
Calcium: 8.8 mg/dL — ABNORMAL LOW (ref 8.9–10.3)
Chloride: 104 mmol/L (ref 98–111)
Creatinine, Ser: 0.97 mg/dL (ref 0.61–1.24)
GFR, Estimated: >60 mL/min (ref >60)
Glucose, Bld: 201 mg/dL — ABNORMAL HIGH (ref 70–99)
Potassium: 3.7 mmol/L (ref 3.5–5.1)
Sodium: 141 mmol/L (ref 135–145)
Total Bilirubin: 0.3 mg/dL (ref 0.0–1.2)
Total Protein: 5.5 g/dL — ABNORMAL LOW (ref 6.5–8.1)

## 2023-12-04 LAB — CBC WITH DIFFERENTIAL/PLATELET
Abs Immature Granulocytes: 0.02 K/uL (ref 0.00–0.07)
Basophils Absolute: 0 K/uL (ref 0.0–0.1)
Basophils Relative: 0 %
Eosinophils Absolute: 0.1 K/uL (ref 0.0–0.5)
Eosinophils Relative: 1 %
HCT: 36 % — ABNORMAL LOW (ref 39.0–52.0)
Hemoglobin: 11.3 g/dL — ABNORMAL LOW (ref 13.0–17.0)
Immature Granulocytes: 0 %
Lymphocytes Relative: 15 %
Lymphs Abs: 0.8 K/uL (ref 0.7–4.0)
MCH: 29.4 pg (ref 26.0–34.0)
MCHC: 31.4 g/dL (ref 30.0–36.0)
MCV: 93.5 fL (ref 80.0–100.0)
Monocytes Absolute: 0.5 K/uL (ref 0.1–1.0)
Monocytes Relative: 10 %
Neutro Abs: 3.6 K/uL (ref 1.7–7.7)
Neutrophils Relative %: 74 %
Platelets: 244 K/uL (ref 150–400)
RBC: 3.85 MIL/uL — ABNORMAL LOW (ref 4.22–5.81)
RDW: 18.9 % — ABNORMAL HIGH (ref 11.5–15.5)
WBC: 4.9 K/uL (ref 4.0–10.5)
nRBC: 0 % (ref 0.0–0.2)

## 2023-12-04 LAB — MAGNESIUM: Magnesium: 1.6 mg/dL — ABNORMAL LOW (ref 1.7–2.4)

## 2023-12-04 MED ORDER — HYDROMORPHONE HCL 1 MG/ML IJ SOLN
2.0000 mg | Freq: Once | INTRAMUSCULAR | Status: AC
  Administered 2023-12-04: 2 mg via INTRAVENOUS
  Filled 2023-12-04: qty 2

## 2023-12-04 MED ORDER — OXYCODONE HCL 5 MG PO TABS
5.0000 mg | ORAL_TABLET | Freq: Once | ORAL | Status: AC
  Administered 2023-12-04: 5 mg via ORAL
  Filled 2023-12-04: qty 1

## 2023-12-04 MED ORDER — SODIUM CHLORIDE 0.9 % IV SOLN
20.0000 mg | Freq: Once | INTRAVENOUS | Status: AC
  Administered 2023-12-04: 20 mg via INTRAVENOUS
  Filled 2023-12-04: qty 2

## 2023-12-04 MED ORDER — PALONOSETRON HCL INJECTION 0.25 MG/5ML
0.2500 mg | Freq: Once | INTRAVENOUS | Status: AC
  Administered 2023-12-04: 0.25 mg via INTRAVENOUS
  Filled 2023-12-04: qty 5

## 2023-12-04 MED ORDER — LENALIDOMIDE 15 MG PO CAPS: 15.0000 mg | ORAL_CAPSULE | Freq: Every day | ORAL | 0 refills | Status: DC

## 2023-12-04 MED ORDER — FAMOTIDINE IN NACL 20-0.9 MG/50ML-% IV SOLN
20.0000 mg | Freq: Once | INTRAVENOUS | Status: AC
  Administered 2023-12-04: 20 mg via INTRAVENOUS
  Filled 2023-12-04: qty 50

## 2023-12-04 MED ORDER — MAGNESIUM SULFATE 2 GM/50ML IV SOLN
2.0000 g | Freq: Once | INTRAVENOUS | Status: AC
  Administered 2023-12-04: 2 g via INTRAVENOUS
  Filled 2023-12-04: qty 50

## 2023-12-04 MED ORDER — DIPHENHYDRAMINE HCL 50 MG/ML IJ SOLN
50.0000 mg | Freq: Once | INTRAMUSCULAR | Status: AC
  Administered 2023-12-04: 50 mg via INTRAVENOUS
  Filled 2023-12-04: qty 1

## 2023-12-04 MED ORDER — ACETAMINOPHEN 325 MG PO TABS
650.0000 mg | ORAL_TABLET | Freq: Once | ORAL | Status: AC
  Administered 2023-12-04: 650 mg via ORAL
  Filled 2023-12-04: qty 2

## 2023-12-04 MED ORDER — SODIUM CHLORIDE 0.9 % IV SOLN: INTRAVENOUS | Status: DC

## 2023-12-04 MED ORDER — SODIUM CHLORIDE 0.9 % IV SOLN
1000.0000 mg | Freq: Once | INTRAVENOUS | Status: AC
  Administered 2023-12-04: 1000 mg via INTRAVENOUS
  Filled 2023-12-04: qty 50

## 2023-12-04 MED ORDER — SODIUM CHLORIDE 0.9 % IV SOLN: Freq: Once | INTRAVENOUS | Status: AC

## 2023-12-04 MED ORDER — DEXTROSE 5 % IV SOLN
56.0000 mg/m2 | Freq: Once | INTRAVENOUS | Status: AC
  Administered 2023-12-04: 120 mg via INTRAVENOUS
  Filled 2023-12-04: qty 60

## 2023-12-04 NOTE — Patient Instructions (Signed)
 [facility] - A DEPT OF [facility]  Discharge Instructions: Thank you for choosing [facility] to provide your oncology and hematology care.  If you have a lab appointment with the Cancer Center - please note that after April 8th, 2024, all labs will be drawn in the cancer center.  You do not have to check in or register with the main entrance as you have in the past but will complete your check-in in the cancer center.  Wear comfortable clothing and clothing appropriate for easy access to any Portacath or PICC line.   We strive to give you quality time with your provider. You may need to reschedule your appointment if you arrive late (15 or more minutes).  Arriving late affects you and other patients whose appointments are after yours.  Also, if you miss three or more appointments without notifying the office, you may be dismissed from the clinic at the provider's discretion.      For prescription refill requests, have your pharmacy contact our office and allow 72 hours for refills to be completed.    Today you received the following chemotherapy and/or immunotherapy agents Kyprolis /Sarclisa       To help prevent nausea and vomiting after your treatment, we encourage you to take your nausea medication as directed.  BELOW ARE SYMPTOMS THAT SHOULD BE REPORTED IMMEDIATELY: *FEVER GREATER THAN 100.4 F (38 C) OR HIGHER *CHILLS OR SWEATING *NAUSEA AND VOMITING THAT IS NOT CONTROLLED WITH YOUR NAUSEA MEDICATION *UNUSUAL SHORTNESS OF BREATH *UNUSUAL BRUISING OR BLEEDING *URINARY PROBLEMS (pain or burning when urinating, or frequent urination) *BOWEL PROBLEMS (unusual diarrhea, constipation, pain near the anus) TENDERNESS IN MOUTH AND THROAT WITH OR WITHOUT PRESENCE OF ULCERS (sore throat, sores in mouth, or a toothache) UNUSUAL RASH, SWELLING OR PAIN  UNUSUAL VAGINAL DISCHARGE OR ITCHING   Items with * indicate a potential emergency and should be  followed up as soon as possible or go to the Emergency Department if any problems should occur.  Please show the CHEMOTHERAPY ALERT CARD or IMMUNOTHERAPY ALERT CARD at check-in to the Emergency Department and triage nurse.  Should you have questions after your visit or need to cancel or reschedule your appointment, please contact [facility] [place] - A DEPT OF [facility] [phone]  and follow the prompts.  Office hours are 8:00 a.m. to 4:30 p.m. Monday - Friday. Please note that voicemails left after 4:00 p.m. may not be returned until the following business day.  We are closed weekends and major holidays. You have access to a nurse at all times for urgent questions. Please call the main number to the clinic [phone] and follow the prompts.  For any non-urgent questions, you may also contact your provider using MyChart. We now offer e-Visits for anyone 86 and older to request care online for non-urgent symptoms. For details visit [email or web address].   Also download the MyChart app! Go to the app store, search MyChart, open the app, select [facility] [place], and log in with your MyChart username and password.

## 2023-12-04 NOTE — Patient Instructions (Signed)
 [facility]  Discharge Instructions  You were seen and examined today by Dr. Davonna. Dr. Davonna is a medical oncologist, meaning that she specializes in the treatment of cancer diagnoses. Dr. Davonna discussed your past medical history, family history of cancers, and the events that led to you being here today.  You were referred to Dr. Davonna for ongoing management of your multiple myeloma.  We will continue your treatment plan that was started in the hospital. Dr. Davonna would like to change your Cytoxan  to Revlimid . Revlimid  is oral chemotherapy and it does have a better response that Cytoxan  and is less toxic for you.  Multiple Myeloma requires lifelong treatment.  Follow-up as scheduled.  Thank you for choosing [name] to provide your oncology and hematology care.   To afford each patient quality time with our provider, please arrive at least 15 minutes before your scheduled appointment time. You may need to reschedule your appointment if you arrive late (10 or more minutes). Arriving late affects you and other patients whose appointments are after yours.  Also, if you miss three or more appointments without notifying the office, you may be dismissed from the clinic at the provider's discretion.    Again, thank you for choosing [facility].  Our hope is that these requests will decrease the amount of time that you wait before being seen by our physicians.   If you have a lab appointment with the Cancer Center - please note that after April 8th, all labs will be drawn in the cancer center.  You do not have to check in or register with the main entrance as you have in the past but will complete your check-in at the cancer center.            _____________________________________________________________  Should you have questions after your visit to [facility], please contact our office at [phone]  and follow the prompts.  Our office hours are 8:00 a.m. to 4:30 p.m. Monday - Thursday and 8:00 a.m. to 2:30 p.m. Friday.  Please note that voicemails left after 4:00 p.m. may not be returned until the following business day.  We are closed weekends and all major holidays.  You do have access to a nurse 24-7, just call the main number to the clinic [phone] and do not press any options, hold on the line and a nurse will answer the phone.    For prescription refill requests, have your pharmacy contact our office and allow 72 hours.    Masks are no longer required in the cancer centers. If you would like for your care team to wear a mask while they are taking care of you, please let them know. You may have one support person who is at least 59 years old accompany you for your appointments.

## 2023-12-04 NOTE — Progress Notes (Signed)
 Hematology-Oncology Clinic Note  Frank Clem Pitts, MD   Reason for Referral: Multiple Myeloma  Oncology History: I have reviewed his chart and materials related to his cancer extensively and collaborated history with the patient. Summary of oncologic history is as follows:  Diagnosis: Multiple Myeloma  Presentation: Altered mental status, generalized weakness, hypercalcemia and anemia requiring blood transfusions. -06/29/2023-07/02/2023:Admitted to [facility] for altered mental status, sepsis, hypercalcemia, symptomatic anemia and was found to have lytic bone lesions.  -07/03/2023: Patient was transferred from [facility] for concern for multiple myeloma.  -07/03/2023-11/07/2023: Hospital course: Patient had left humeral fracture, intubated for respiratory failure, and was requiring multiple transfusions. He was trached and vented. Was also on hemodialysis briefly. Discharged to [facility] on 11/07/2023 -07/03/2023: Multiple myeloma labs: KFLC:108.3, LFLC:8, Ratio:13.54. Cr:4.32, LDH:125, Hb:7.2,IgG:11,507, IgM:5, IgA:74.  -Urine: KFLC: 354.79,LFLC: 34.89, ratio :34.89. M spike:28.9 -07/04/2023:Bone marrow biopsy:  Kappa restricted plasma cell neoplasm with aberrant CD56 expression  and involving on average 75% of an overall hypercellular bone marrow (80%) consistent with plasma cell myeloma   -Is consistent with a primary plasma cell leukemia in the background of bone marrow criteria plasma cell myeloma.   -MM FISH: 1p deletion:detected. 13q del, TP 53,t(11;14),t(4;14),t(14;16),t(14;20): not detected  -Cytogenetics: Complex karyotype -07/08/2023-08/12/2023: Velcade , cyclophosphamide  and dexamethasone . Changed for light chains trending up. -07/22/2023: SPEP: M spike:3.1, IFE:The SPE pattern demonstrates a single peak (M-spike) in the gamma region which may represent monoclonal protein.   -KFLC:138, LFLC:19.9, ratio:6.93  -08/02/2023: XQOR:730.6, LFLC:47.3, Ratio:5.69. SPEP:M spike:2.8,  IgG: 4543 -08/21/2023-11/04/2023: Carfilzomib  + Cytoxan  + Isatuximab . Received 2 cycles. Second cycle delayed for humeral surgery -10/13/2023: Left arm amputation: Involvement by the patient's known plasma cell myeloma.   History of Presenting Illness: Frank [name]. 58 y.o. male is referred by Dr.Ennever for Multiple Myeloma.  Frank [name] was admitted to the hospital for hypercalcemia, probable sepsis, CAP, symptomatic anemia requiring multiple transfusions.  During this time labs were consistent with multiple myeloma and then was transferred over to [facility] for further care.  He was started on chemotherapy with Velcade  cyclophosphamide  and dexamethasone .  Even though his IgG and M spike were trending down, his free light chains continue to be elevated so he was then changed to carfilzomib  Cytoxan  and Isatuximab .  He received 2 cycles of this prior to discharge.  Frank [name] was discharged to SNF and is still recovering.  Frank [name] is experiencing pain in his right thigh, as there was a hematoma that has previously been drained. He is requesting pain medication to manage the pain. He reports having some numbness in his leg and thigh region around the hematoma site.   Frank [name] reports regaining strength in his arms.  He is able to sit up at this time but is still not strong enough to get out of the chair.  Taz denies loss of appetite.  He is currently not requiring any oxygen.  Overall he is doing significantly better than what he was in the hospital.  We discussed changing his treatment from cyclophosphamide  to Revlimid  and patient is in agreement to that.    Medical History: Past Medical History:  Diagnosis Date   Anxiety    Arthritis    Asthma    Bipolar disorder (HCC)    Current every day smoker    Depression    Diabetes mellitus, type II (HCC)    Dyspnea    History of kidney stones    History of pneumonia    Hyperlipidemia    Hypertension  Morbid obesity (HCC)    Sedentary  lifestyle    Seizures (HCC)    last seizure 12 yrs ago, no current problem   Sleep apnea    uses CPAP nightly    Surgical history: Past Surgical History:  Procedure Laterality Date   CARDIAC CATHETERIZATION  2020   carpal tunel Left    COLONOSCOPY     HARDWARE REMOVAL Left 07/26/2022   Procedure: HARDWARE REMOVAL ELBOW;  Surgeon: [name], MD;  Location: [facility] OR;  Service: Orthopedics;  Laterality: Left;   IR CATHETER TUBE CHANGE  10/23/2023   IR FLUORO GUIDE CV LINE RIGHT  08/07/2023   IR FLUORO GUIDE CV LINE RIGHT  09/01/2023   IR GASTROSTOMY TUBE MOD SED  08/14/2023   IR GASTROSTOMY TUBE REMOVAL  10/01/2023   IR IMAGING GUIDED PORT INSERTION  10/15/2023   IR REMOVAL TUN CV CATH W/O FL  08/29/2023   IR REMOVAL TUN CV CATH W/O FL  09/08/2023   IR SCLEROTHERAPY OF A FLUID COLLECTION  10/10/2023   IR SCLEROTHERAPY OF A FLUID COLLECTION  10/23/2023   IR US  GUIDE VASC ACCESS RIGHT  08/07/2023   IR US  GUIDE VASC ACCESS RIGHT  09/01/2023   ORIF HUMERUS FRACTURE Left 01/16/2022   Procedure: OPEN REDUCTION INTERNAL FIXATION (ORIF) DISTAL HUMERUS FRACTURE;  Surgeon: [name], MD;  Location: [facility] OR;  Service: Orthopedics;  Laterality: Left;   ORIF HUMERUS FRACTURE Left 01/29/2023   Procedure: OPEN REDUCTION INTERNAL FIXATION (ORIF) DISTAL HUMERUS FRACTURE;  Surgeon: [name], MD;  Location: [facility] OR;  Service: Orthopedics;  Laterality: Left;   OTHER SURGICAL HISTORY     R & L shoulder   OTHER SURGICAL HISTORY Right    Knee surgery x several   TOTAL HIP ARTHROPLASTY Left 12/26/2017   Procedure: LEFT TOTAL HIP ARTHROPLASTY ANTERIOR APPROACH;  Surgeon: Vernetta Lonni GRADE, MD;  Location: [facility];  Service: Orthopedics;  Laterality: Left;   TOTAL KNEE ARTHROPLASTY Left 11/23/2021   Procedure: LEFT TOTAL KNEE ARTHROPLASTY;  Surgeon: Vernetta Lonni GRADE, MD;  Location: [facility];  Service: Orthopedics;  Laterality: Left;     Allergies:  is allergic to morphine  and codeine, shellfish  allergy, betadine  [povidone iodine ], bupropion, chlorhexidine , influenza vaccines, and metoprolol .  Medications:  Current Outpatient Medications  Medication Sig Dispense Refill   acyclovir  (ZOVIRAX ) 200 MG capsule Take 2 capsules (400 mg total) by mouth 2 (two) times daily.     albuterol  (PROVENTIL  HFA;VENTOLIN  HFA) 108 (90 Base) MCG/ACT inhaler Inhale 2 puffs into the lungs every 6 (six) hours as needed for wheezing or shortness of breath.     albuterol  (PROVENTIL ) (2.5 MG/3ML) 0.083% nebulizer solution 2.5 mg every 2 (two) hours as needed for wheezing or shortness of breath.     amLODipine  (NORVASC ) 5 MG tablet Take by mouth.     apixaban  (ELIQUIS ) 5 MG TABS tablet Take by mouth.     atorvastatin  (LIPITOR) 40 MG tablet Take 40 mg by mouth.     busPIRone  (BUSPAR ) 15 MG tablet Take 15 mg by mouth 2 (two) times daily.     chlorthalidone (HYGROTON) 25 MG tablet Take by mouth.     collagenase  (SANTYL ) 250 UNIT/GM ointment Apply topically daily.     cyclobenzaprine  (FLEXERIL ) 5 MG tablet Take 1 tablet (5 mg total) by mouth 3 (three) times daily as needed for muscle spasms. (Patient taking differently: Take 5 mg by mouth daily as needed for muscle spasms.) 30 tablet 0  divalproex  (DEPAKOTE ) 500 MG DR tablet Take 500 mg by mouth 3 (three) times daily.     docusate sodium  (COLACE) 100 MG capsule Take by mouth.     EPINEPHrine  0.3 mg/0.3 mL IJ SOAJ injection epinephrine  0.3 mg/0.3 mL auto-injector     esomeprazole (NEXIUM) 40 MG capsule Take by mouth.     gabapentin  (NEURONTIN ) 300 MG capsule Take 300 mg by mouth.     glimepiride  (AMARYL ) 2 MG tablet Take 2 mg by mouth.     hydrALAZINE  (APRESOLINE ) 25 MG tablet Take by mouth.     HYDROmorphone  (DILAUDID ) 2 MG tablet Take by mouth.     hydrOXYzine  (ATARAX ) 10 MG tablet Take 1 tablet (10 mg total) by mouth 3 (three) times daily as needed for anxiety.     insulin  aspart (NOVOLOG ) 100 UNIT/ML injection Inject 0-15 Units into the skin 4 (four) times  daily -  before meals and at bedtime.     insulin  aspart (NOVOLOG ) 100 UNIT/ML injection Inject 2 Units into the skin 4 (four) times daily -  before meals and at bedtime.     insulin  glargine (LANTUS  SOLOSTAR) 100 UNIT/ML Solostar Pen Inject into the skin.     lidocaine  (LIDODERM ) 5 % Place 1 patch onto the skin daily. Remove & Discard patch within 12 hours or as directed by MD     lisinopril  (ZESTRIL ) 20 MG tablet Take 20 mg by mouth.     montelukast  (SINGULAIR ) 5 MG chewable tablet Chew 1 tablet (5 mg total) by mouth at bedtime.     omeprazole (PRILOSEC) 20 MG capsule Take by mouth.     PARoxetine  (PAXIL ) 40 MG tablet Take 40 mg by mouth every morning.     polyethylene glycol (MIRALAX  / GLYCOLAX ) 17 g packet Take 17 g by mouth daily as needed for mild constipation.     tamsulosin  (FLOMAX ) 0.4 MG CAPS capsule Take 0.4 mg by mouth at bedtime.     TRUE METRIX BLOOD GLUCOSE TEST test strip SMARTSIG:Via Meter     umeclidinium-vilanterol (ANORO ELLIPTA ) 62.5-25 MCG/ACT AEPB Inhale 1 puff into the lungs daily.     valproic  acid (DEPAKENE ) 250 MG capsule Take by mouth.     valproic  acid (DEPAKENE ) 250 MG/5ML solution Take 15 mLs (750 mg total) by mouth 2 (two) times daily.     verapamil  (CALAN ) 120 MG tablet Take by mouth.     lenalidomide  (REVLIMID ) 15 MG capsule Take 1 capsule (15 mg total) by mouth daily. Take for 21 days, then hold for 7 days. Repeat every 28 days. 21 capsule 0   No current facility-administered medications for this visit.   Facility-Administered Medications Ordered in Other Visits  Medication Dose Route Frequency Provider Last Rate Last Admin   0.9 %  sodium chloride  infusion   Intravenous Continuous Evah Rashid, MD   Stopped at 12/11/23 1305    Review of Systems: Constitutional: Denies fevers, chills or abnormal night sweats Eyes: Denies blurriness of vision, double vision or watery eyes Ears, nose, mouth, throat, and face: Denies mucositis or sore  throat Respiratory: Denies cough, dyspnea or wheezes Cardiovascular: Denies palpitation, chest discomfort or lower extremity swelling Gastrointestinal:  Denies nausea, heartburn or change in bowel habits Skin: Denies abnormal skin rashes Lymphatics: Denies new lymphadenopathy or easy bruising Neurological:Denies numbness, tingling or new weaknesses Behavioral/Psych: Mood is stable, no new changes  All other systems were reviewed with the patient and are negative.  Physical Examination: ECOG PERFORMANCE STATUS: 3 - Symptomatic, >50%  confined to bed   GENERAL: Patient is bedridden, alert and reports of pain, obese male LYMPH:  no palpable lymphadenopathy in the cervical, axillary or inguinal LUNGS: clear to auscultation and percussion with normal breathing effort HEART: regular rate & rhythm and no murmurs and no lower extremity edema ABDOMEN:abdomen soft, non-tender and normal bowel sounds Musculoskeletal: Left arm amputated. PSYCH: alert & oriented x 3 with fluent speech NEURO: no focal motor/sensory deficits   Laboratory Data: I have reviewed the data as listed Lab Results  Component Value Date   WBC 5.5 12/11/2023   HGB 11.4 (L) 12/11/2023   HCT 35.4 (L) 12/11/2023   MCV 91.7 12/11/2023   PLT 242 12/11/2023   Recent Labs    07/23/23 0416 07/23/23 1722 08/28/23 0515 08/29/23 0752 11/07/23 0437 12/04/23 0912 12/11/23 0953  NA 128*   < > 131*   < > 139 141 137  K 4.2   < > 3.5   < > 4.4 3.7 3.9  CL 99   < > 94*   < > 110 104 103  CO2 24   < > 26   < > 24 26 24   GLUCOSE 128*   < > 86   < > 86 201* 78  BUN 33*   < > 60*   < > 41* 19 23*  CREATININE 1.62*   < > 1.67*   < > 1.43* 0.97 0.96  CALCIUM  7.5*   < > 8.5*   < > 8.5* 8.8* 8.8*  GFRNONAA 49*   < > 47*   < > 56* >60 >60  PROT 8.5*   < > 6.5   < > 4.8* 5.5* 5.5*  ALBUMIN  <1.5*  <1.5*   < > 2.4*  2.4*   < > 2.0* 2.6* 2.8*  AST 19   < > 21   < > 11* <10* <10*  ALT 14   < > 16   < > 7 8 8   ALKPHOS 59   < > 55    < > 48 55 48  BILITOT 0.5   < > 0.6   < > 0.4 0.3 0.4  BILIDIR <0.1  --  0.1  --   --   --   --   IBILI NOT CALCULATED  --  0.5  --   --   --   --    < > = values in this interval not displayed.    Latest Reference Range & Units 10/25/23 03:55  Total Protein ELP 6.0 - 8.5 g/dL 4.8 (L)  Albumin  ELP 2.9 - 4.4 g/dL 2.5 (L)  Globulin, Total 2.2 - 3.9 g/dL 2.3 (C)  A/G Ratio 0.7 - 1.7  1.1 (C)  Alpha-1-Globulin 0.0 - 0.4 g/dL 0.2  Joeyj-7-Honalopw 0.4 - 1.0 g/dL 0.6  Beta Globulin 0.7 - 1.3 g/dL 0.7  Gamma Globulin 0.4 - 1.8 g/dL 0.8  M-SPIKE, % Not Observed g/dL 0.5 (H)  SPE Interp.  Comment  Comment  Comment  IgG (Immunoglobin G), Serum 603 - 1,613 mg/dL 049  IgM (Immunoglobulin M), Srm 20 - 172 mg/dL 10 (L)  IgA 90 - 613 mg/dL 78 (L)  (L): Data is abnormally low (H): Data is abnormally high (C): Corrected   Latest Reference Range & Units 10/25/23 03:55  Kappa free light chain 3.3 - 19.4 mg/L 13.7  Lambda free light chains 5.7 - 26.3 mg/L 11.4  Kappa, lambda light chain ratio 0.26 - 1.65  1.20    Radiographic  Studies: I have personally reviewed the radiological images as listed and agreed with the findings in the report.  CT ABDOMEN PELVIS W CONTRAST CLINICAL DATA:  Follow-up drainage catheter  EXAM: CT ABDOMEN AND PELVIS WITH CONTRAST  TECHNIQUE: Multidetector CT imaging of the abdomen and pelvis was performed using the standard protocol following bolus administration of intravenous contrast.  RADIATION DOSE REDUCTION: This exam was performed according to the departmental dose-optimization program which includes automated exposure control, adjustment of the mA and/or kV according to patient size and/or use of iterative reconstruction technique.  CONTRAST:  OMNIPAQUE  IOHEXOL  350 MG/ML SOLN  COMPARISON:  10/07/2023  FINDINGS: Lower chest: Patchy bibasilar atelectasis is noted worse on the right than the left.  Hepatobiliary: No focal liver abnormality is  seen. No gallstones, gallbladder wall thickening, or biliary dilatation.  Pancreas: Unremarkable. No pancreatic ductal dilatation or surrounding inflammatory changes.  Spleen: Normal in size without focal abnormality.  Adrenals/Urinary Tract: Adrenal glands are within normal limits. Kidneys demonstrate scattered cysts bilaterally. No follow-up is recommended. No renal calculi or obstructive changes are seen. The bladder is partially distended.  Stomach/Bowel: Scattered fecal material is noted throughout the colon without obstructive change. The appendix is within normal limits. Small bowel and stomach are within normal limits.  Vascular/Lymphatic: Aortic atherosclerosis. No enlarged abdominal or pelvic lymph nodes.  Reproductive: Prostate is unremarkable.  Other: No abdominal wall hernia or abnormality. No abdominopelvic ascites.  Musculoskeletal: Left hip replacement is noted. Degenerative changes of lumbar spine are seen. Chronic T11 compression fracture is noted. In the right buttock a pigtail drainage catheter is identified. There remains a fluid collection which measures approximately 11 by 5 cm in greatest transverse and AP dimensions respectively. The collection in the anterior right thigh is incompletely evaluated on this exam but measures at least 8.7 by 2.6 cm in transverse and AP dimensions respectively. This is slightly smaller seen on the prior study.  IMPRESSION: Fluid collections in the anterior right thigh and right buttock. Drainage catheter remains in the right buttock. The overall appearance is relatively stable.  The anterior right thigh collection is slightly smaller than that seen on the prior exam.  Patchy atelectatic changes in the bases right greater than left.  Electronically Signed   By: Oneil Devonshire M.D.   On: 10/31/2023 03:27    ASSESSMENT & PLAN:  Patient is a 58 y.o. male presenting for multiple myeloma  Assessment & Plan Multiple  myeloma not having achieved remission (HCC) R-ISS stage III, standard risk, IgG kappa light chain multiple myeloma Oncology history as above Patient was hospitalized for several months and received 2 cycles of with Velcade , Cytoxan , dexamethasone  followed by 2 cycles of Isatuximab , carfilzomib , Cytoxan  and dexamethasone .  - Patient had good response to this treatment. - Will continue Isatuximab , carfilzomib  and dexamethasone .  Will replace cyclophosphamide  with Revlimid  15 mg 3 weeks on/1 week off - Discussed risk versus benefits of the treatment in detail.  Side effects were discussed in detail.This combination commonly causes low blood counts (anemia, neutropenia, thrombocytopenia), infections (upper respiratory infections, pneumonia, etc.), and infusion reactions from isatuximab ; carfilzomib  adds risks of cardiovascular toxicity (hypertension, heart failure, arrhythmias), kidney stress, and pulmonary issues; lenalidomide  contributes to risk of thromboembolism, rash, gastrointestinal upset, and further suppression of immune cells; dexamethasone  tends to cause metabolic effects (high blood sugar), mood/behavior changes, insomnia, fluid retention and increased risk of infection due to immunosuppression. - Will repeat baseline labs today including multiple myeloma labs  Return to clinic in 1  month prior to cycle 4 to assess for tolerance and response. Neoplasm related pain Patient is currently on Dilaudid  2 mg every 6 hours as needed.     Orders Placed This Encounter  Procedures   Magnesium     Standing Status:   Future    Number of Occurrences:   1    Expected Date:   12/11/2023    Expiration Date:   12/10/2024   CBC with Differential    Standing Status:   Future    Number of Occurrences:   1    Expected Date:   12/11/2023    Expiration Date:   12/10/2024   Comprehensive metabolic panel    Standing Status:   Future    Number of Occurrences:   1    Expected Date:   12/11/2023    Expiration  Date:   12/10/2024   Magnesium     Standing Status:   Future    Expected Date:   12/18/2023    Expiration Date:   12/17/2024   CBC with Differential    Standing Status:   Future    Expected Date:   12/18/2023    Expiration Date:   12/17/2024   Comprehensive metabolic panel    Standing Status:   Future    Expected Date:   12/18/2023    Expiration Date:   12/17/2024   Magnesium     Standing Status:   Future    Expected Date:   01/01/2024    Expiration Date:   12/31/2024   CBC with Differential    Standing Status:   Future    Expected Date:   01/01/2024    Expiration Date:   12/31/2024   Comprehensive metabolic panel    Standing Status:   Future    Expected Date:   01/01/2024    Expiration Date:   12/31/2024   Magnesium     Standing Status:   Future    Expected Date:   01/08/2024    Expiration Date:   01/07/2025   CBC with Differential    Standing Status:   Future    Expected Date:   01/08/2024    Expiration Date:   01/07/2025   Comprehensive metabolic panel    Standing Status:   Future    Expected Date:   01/08/2024    Expiration Date:   01/07/2025   Magnesium     Standing Status:   Future    Expected Date:   01/15/2024    Expiration Date:   01/14/2025   CBC with Differential    Standing Status:   Future    Expected Date:   01/15/2024    Expiration Date:   01/14/2025   Comprehensive metabolic panel    Standing Status:   Future    Expected Date:   01/15/2024    Expiration Date:   01/14/2025    The total time spent in the appointment was 60 minutes encounter with patients including review of chart and various tests results, discussions about plan of care and coordination of care plan   All questions were answered. The patient knows to call the clinic with any problems, questions or concerns. No barriers to learning was detected.  Mickiel Dry, MD 9/25/20252:45 PM

## 2023-12-04 NOTE — Telephone Encounter (Signed)
 Oral Oncology Patient Advocate Encounter   New authorization - Medicare insurance   Received notification that prior authorization for Lenalidomide  is required.   PA submitted on CMM via Latent Key [id number]  Status is pending     Frank [name] ([name]) [name], CPhT  [name], [name], [name] - Oral Chemotherapy Patient Advocate Specialist III Phone: [phone]  Fax: [phone]

## 2023-12-04 NOTE — Telephone Encounter (Signed)
 Oral Oncology Patient Advocate Encounter  Prior Authorization for Lenalidomide  has been approved.    PA# [id number]  Effective dates: 03/19/2023 through 03/17/2024  Patients co-pay is $4.48.    Bren Borys ([name]) [name], CPhT  [facility], [name], [name] - Oral Chemotherapy Patient Advocate Specialist III Phone: [phone]  Fax: [phone]

## 2023-12-04 NOTE — Telephone Encounter (Unsigned)
 Clinical Pharmacist Practitioner Encounter   Received new prescription for Revlimid  (lenalidomide ) for the treatment of multiple myeloma in conjunction with Isatuximab  and carfilzomib  , planned duration until disease control or unacceptable drug toxicity.  CMP from 12/05/23 assessed, no relevant lab abnormalities. Prescription dose and frequency assessed.   Current medication list in Epic reviewed, no DDIs with lenalidomide  identified.   Evaluated chart and no patient barriers to medication adherence identified.   Prescription has been e-scribed to [facility].   Oral Oncology Clinic will continue to follow for insurance authorization, copayment issues, initial counseling and start date.   Maiya Kates N. Mason Burleigh, PharmD, BCOP, CPP Hematology/Oncology Clinical Pharmacist [facility] Oral Chemotherapy Navigation Clinic [phone]  12/04/2023 2:32 PM

## 2023-12-04 NOTE — Progress Notes (Signed)
 Decrease Sarclisa  dose to 1000mg  based on most recent weight.  Bridgett Leach S[place], [place], BCPS, BCOP 12/04/2023 11:58 AM

## 2023-12-04 NOTE — Progress Notes (Signed)
 Patient presents today for Kyprolis  and Sarclia infusion per providers order.  Vital signs and labs reviewed by MD.  Patient will receive 2 grams IV magnesium  today with treatment.  Message received from Joesph Bohr RN/Dr. Davonna, patient okay for treatment.    Patient also getting 2 mg IV dilaudid , per Dr. Armanda  order.  Treatment given today per MD orders.  Stable during infusion without adverse affects.  Vital signs stable.  No complaints at this time.  Discharge from clinic via strecher in stable condition.  Alert and oriented X 3.  Follow up with [facility] as scheduled.

## 2023-12-05 ENCOUNTER — Other Ambulatory Visit: Payer: Self-pay

## 2023-12-05 MED ORDER — LENALIDOMIDE 15 MG PO CAPS
15.0000 mg | ORAL_CAPSULE | Freq: Every day | ORAL | 0 refills | Status: DC
Start: 2023-12-05 — End: 2023-12-09

## 2023-12-08 NOTE — Telephone Encounter (Signed)
 Clinical Pharmacist Practitioner Encounter   Patient Education I spoke with patient's wife [name] for overview of new oral chemotherapy medication: Revlimid  (lenalidomide ) for the treatment of multiple myeloma in conjunction with Isatuximab  and carfilzomib  , planned duration until disease control or unacceptable drug toxicity.   Treatment goal: Control  Counseled Michelle on administration, dosing, side effects, monitoring, drug-food interactions, safe handling, storage, and disposal. Patient will take 1 capsule (15 mg total) by mouth daily. Take for 21 days, then hold for 7 days. Repeat every 28 days.   Side effects include but not limited to: rash/itchy skin, N/V, fatigue, decreased wbc/hgb/plt, constipation or diarrhea  Reviewed with [name] importance of keeping a medication schedule and plan for any missed doses.  After discussion with [name] no patient barriers to medication adherence identified.   Distress evaluation: Distress thermometer completed during telephone call and reviewed with [name]. Due to score, social work referral has been sent.   Communication and Learning Assessment Primary learner: patient's wife Barriers to learning: No barriers Preferred language: English Learning preferences: Listening Reading  [name]  voiced understanding and appreciation. All questions answered. Medication handout provided.  Provided [facility] with Oral Chemotherapy Navigation Clinic phone number. [name] knows to call the office with questions or concerns. Oral Chemotherapy Navigation Clinic will continue to follow.  Jobeth Pangilinan N. Camden Knotek, PharmD, BCOP, CPP Hematology/Oncology Clinical Pharmacist [facility] Oral Chemotherapy Navigation Clinic [phone]  12/08/2023 12:55 PM

## 2023-12-09 MED ORDER — LENALIDOMIDE 15 MG PO CAPS: 15.0000 mg | ORAL_CAPSULE | Freq: Every day | ORAL | 0 refills | Status: DC

## 2023-12-09 NOTE — Telephone Encounter (Signed)
 [facility] told this patient's wife they could not fill the Revlimid  because he was in a facility. I sent his rx to [facility] because they have helped in this situation in the past.

## 2023-12-10 ENCOUNTER — Inpatient Hospital Stay: Admitting: Licensed Clinical Social Worker

## 2023-12-10 DIAGNOSIS — C9000 Multiple myeloma not having achieved remission: Secondary | ICD-10-CM

## 2023-12-10 NOTE — Progress Notes (Signed)
 [facility] Clinical Social Work  Initial Assessment   Frank Dray Dente. is a 58 y.o. year old male .  CSW contacted pt's wife to obtain details for assessment.  Clinical Social Work was referred by medical provider for assessment of psychosocial needs.   SDOH (Social Determinants of Health) assessments performed: Yes   SDOH Screenings   Food Insecurity: No Food Insecurity (07/05/2023)  Housing: Low Risk  (07/05/2023)  Transportation Needs: No Transportation Needs (07/05/2023)  Utilities: Not At Risk (07/05/2023)  Depression (PHQ2-9): Low Risk  (12/04/2023)  Tobacco Use: High Risk (10/13/2023)    PHQ 2/9:    12/04/2023   10:48 AM 05/16/2016    9:24 AM  Depression screen PHQ 2/9  Decreased Interest 0 0  Down, Depressed, Hopeless 0 0  PHQ - 2 Score 0 0     Distress Screen completed: Yes    12/08/2023    1:00 PM  [facility] DISTRESS SCREENING  Screening Type Initial Screening  How much distress have you been experiencing in the past week? (0-10) 9  Emotional concerns type Worry or anxiety;Sadness or depression      Family/Social Information:  Housing Arrangement: patient lives with his wife.  Pt was recently hospitalized for an extended period and was subsequently discharged to [facility] in [place] where he has been for the past 25 days.  The goal is for pt to return home once physically able.  Pt's wife expressed a number of concerns w/ the facility as pt has become more deconditioned since being there.  Per pt's wife the facility is not providing any physical rehab and she is concerned she will not be able to take care of him when he returns home.  According to pt's wife she has requested a number of meetings to discuss goals and has not been able to advocate for her husband.  Pt also has a Pmhx significant for bi-polar disorder for which he receives medication from his PCP.  Pt's wife has requested a psychiatry consult as pt has become more depressed since his hospitalization.  CSW  suggested contacting an [place].  CSW also suggested reaching out to the PCP for a psychiatry referral as pt may be able to receive virtual mental health support while in the facility.   Family members/support persons in your life? Pt has a 7 y/o daughter who can provide some support as needed. Transportation concerns: pt presently is being transported by the facility he is staying at.  Transportation may become an issue if pt is discharged from the facility and unable to ambulate.   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 Pt's wife also receives disability.  Income source: Secretary/administrator concerns: Yes, current concerns Type of concern: Utilities and Medical bills Food access concerns: no Religious or spiritual practice: Yes-Quaker Advanced directives: No Services Currently in place:  none  Coping/ Adjustment to diagnosis: Patient understands treatment plan and what happens next? yes Concerns about diagnosis and/or treatment: Overwhelmed by information, Afraid of cancer, How I will pay for the services I need, How will I care for myself, and Quality of life Patient reported stressors: Finances, Depression, Anxiety/ nervousness, Adjusting to my illness, Facing my mortality, and Physical issues Hopes and/or priorities: Pt's priority is to continue treatment w/ the hope of positive results Patient enjoys not discussed Current coping skills/ strengths: Motivation for treatment/growth  and Supportive family/friends     SUMMARY: Current SDOH Barriers:  Financial constraints  related to fixed income and Inability to perform ADL's independently  Clinical Social Work Clinical Goal(s):  Explore community resource options for unmet needs related to:  Financial Strain   Interventions: Discussed common feeling and emotions when being diagnosed with cancer, and the importance of support during treatment Informed patient of the support team  roles and support services at [facility] Provided CSW contact information and encouraged patient to call with any questions or concerns CSW discussed the [employer] w/ pt's wife.  Pt was receiving food stamps, but is not currently receiving them.  Per pt's wife she was instructed to wait until pt is discharged from the rehab facility to apply again.  Once applied and approved pt will meet presumptive eligibility.  Pt has also connected w/ the [facility] and will utilize transportation benefits once she is transporting pt.  Following discharge from the rehab facility CSW will explore additional financial resources.   Follow Up Plan: Patient will contact CSW with any support or resource needs Patient verbalizes understanding of plan: Yes    Frank JONELLE Manna, LCSW Clinical Social Worker [facility]

## 2023-12-11 ENCOUNTER — Inpatient Hospital Stay

## 2023-12-11 ENCOUNTER — Encounter (HOSPITAL_COMMUNITY): Payer: Self-pay | Admitting: Oncology

## 2023-12-11 VITALS — BP 160/115 | HR 89 | Resp 19

## 2023-12-11 DIAGNOSIS — C9000 Multiple myeloma not having achieved remission: Secondary | ICD-10-CM

## 2023-12-11 DIAGNOSIS — Z5112 Encounter for antineoplastic immunotherapy: Secondary | ICD-10-CM | POA: Diagnosis not present

## 2023-12-11 DIAGNOSIS — G893 Neoplasm related pain (acute) (chronic): Secondary | ICD-10-CM | POA: Insufficient documentation

## 2023-12-11 LAB — COMPREHENSIVE METABOLIC PANEL WITH GFR
ALT: 8 U/L (ref 0–44)
AST: <10 U/L — ABNORMAL LOW (ref 15–41)
Albumin: 2.8 g/dL — ABNORMAL LOW (ref 3.5–5.0)
Alkaline Phosphatase: 48 U/L (ref 38–126)
Anion gap: 10 (ref 5–15)
BUN: 23 mg/dL — ABNORMAL HIGH (ref 6–20)
CO2: 24 mmol/L (ref 22–32)
Calcium: 8.8 mg/dL — ABNORMAL LOW (ref 8.9–10.3)
Chloride: 103 mmol/L (ref 98–111)
Creatinine, Ser: 0.96 mg/dL (ref 0.61–1.24)
GFR, Estimated: >60 mL/min (ref >60)
Glucose, Bld: 78 mg/dL (ref 70–99)
Potassium: 3.9 mmol/L (ref 3.5–5.1)
Sodium: 137 mmol/L (ref 135–145)
Total Bilirubin: 0.4 mg/dL (ref 0.0–1.2)
Total Protein: 5.5 g/dL — ABNORMAL LOW (ref 6.5–8.1)

## 2023-12-11 LAB — CBC WITH DIFFERENTIAL/PLATELET
Abs Immature Granulocytes: 0.03 K/uL (ref 0.00–0.07)
Basophils Absolute: 0 K/uL (ref 0.0–0.1)
Basophils Relative: 0 %
Eosinophils Absolute: 0.1 K/uL (ref 0.0–0.5)
Eosinophils Relative: 2 %
HCT: 35.4 % — ABNORMAL LOW (ref 39.0–52.0)
Hemoglobin: 11.4 g/dL — ABNORMAL LOW (ref 13.0–17.0)
Immature Granulocytes: 1 %
Lymphocytes Relative: 17 %
Lymphs Abs: 1 K/uL (ref 0.7–4.0)
MCH: 29.5 pg (ref 26.0–34.0)
MCHC: 32.2 g/dL (ref 30.0–36.0)
MCV: 91.7 fL (ref 80.0–100.0)
Monocytes Absolute: 0.9 K/uL (ref 0.1–1.0)
Monocytes Relative: 16 %
Neutro Abs: 3.5 K/uL (ref 1.7–7.7)
Neutrophils Relative %: 64 %
Platelets: 242 K/uL (ref 150–400)
RBC: 3.86 MIL/uL — ABNORMAL LOW (ref 4.22–5.81)
RDW: 18.8 % — ABNORMAL HIGH (ref 11.5–15.5)
WBC: 5.5 K/uL (ref 4.0–10.5)
nRBC: 0 % (ref 0.0–0.2)

## 2023-12-11 LAB — MAGNESIUM: Magnesium: 1.8 mg/dL (ref 1.7–2.4)

## 2023-12-11 MED ORDER — HYDROMORPHONE HCL 1 MG/ML IJ SOLN
2.0000 mg | Freq: Once | INTRAMUSCULAR | Status: AC
  Administered 2023-12-11: 2 mg via INTRAVENOUS
  Filled 2023-12-11: qty 2

## 2023-12-11 MED ORDER — DEXTROSE 5 % IV SOLN
56.0000 mg/m2 | Freq: Once | INTRAVENOUS | Status: AC
  Administered 2023-12-11: 120 mg via INTRAVENOUS
  Filled 2023-12-11: qty 60

## 2023-12-11 MED ORDER — PALONOSETRON HCL INJECTION 0.25 MG/5ML
0.2500 mg | Freq: Once | INTRAVENOUS | Status: AC
  Administered 2023-12-11: 0.25 mg via INTRAVENOUS
  Filled 2023-12-11: qty 5

## 2023-12-11 MED ORDER — SODIUM CHLORIDE 0.9 % IV SOLN
20.0000 mg | Freq: Once | INTRAVENOUS | Status: AC
  Administered 2023-12-11: 20 mg via INTRAVENOUS
  Filled 2023-12-11: qty 20

## 2023-12-11 MED ORDER — HYDROMORPHONE HCL 4 MG/ML IJ SOLN: 4.0000 mg | Freq: Once | INTRAMUSCULAR | Status: DC

## 2023-12-11 MED ORDER — CLONIDINE HCL 0.1 MG PO TABS
0.2000 mg | ORAL_TABLET | Freq: Once | ORAL | Status: AC
  Administered 2023-12-11: 0.2 mg via ORAL
  Filled 2023-12-11: qty 2

## 2023-12-11 MED ORDER — SODIUM CHLORIDE 0.9 % IV SOLN: INTRAVENOUS | Status: DC

## 2023-12-11 MED ORDER — SODIUM CHLORIDE 0.9 % IV SOLN: Freq: Once | INTRAVENOUS | Status: AC

## 2023-12-11 NOTE — Assessment & Plan Note (Signed)
 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  - Patient had good response to this treatment. - Will continue Isatuximab , carfilzomib  and dexamethasone .  Will replace cyclophosphamide  with Revlimid  15 mg 3 weeks on/1 week off - Discussed risk versus benefits of the treatment in detail.  Side effects were discussed in detail.This combination commonly causes low blood counts (anemia, neutropenia, thrombocytopenia), infections (upper respiratory infections, pneumonia, etc.), and infusion reactions from isatuximab ; carfilzomib  adds risks of cardiovascular toxicity (hypertension, heart failure, arrhythmias), kidney stress, and pulmonary issues; lenalidomide  contributes to risk of thromboembolism, rash, gastrointestinal upset, and further suppression of immune cells; dexamethasone  tends to cause metabolic effects (high blood sugar), mood/behavior changes, insomnia, fluid retention and increased risk of infection due to immunosuppression. - Will repeat baseline labs today including multiple myeloma labs  Return to clinic in 1 month prior to cycle 4 to assess for tolerance and response.

## 2023-12-11 NOTE — Progress Notes (Signed)
 Patient's blood pressure diastolic elevated. Patient denies any headache or dizziness. Patient denies chest pain. Patient rates pain in the right leg a 7/10. Patient received 4 mg of IV dilaudid  @ 10:36 am.

## 2023-12-11 NOTE — Progress Notes (Signed)
 Blood pressure is 147/112, patient complaining of pain in his right leg where a hematoma is. MD made aware and will give 4 mg dilaudid  per MD order. Patient did take BP medication at facility today prior to arriving at clinic.  Labs reviewed, ok to treat per parameters.  Blood pressure is 132/107. MD made aware. Patient is asymptomatic, will monitor per MD.    1300 Blood pressure still elevated. MD made aware and will give clonidine  0.2 mg per standing orders. Dr. Davonna aware and ok to discharge after medication is given.   Treatment given per orders. Patient tolerated it well without problems. Vitals stable and discharged from clinic via stretcher by [employer].  Follow up as scheduled.

## 2023-12-11 NOTE — Progress Notes (Signed)
 Dilaudid  dose of 4 mg will be given in 2 x 2 mg doses on MAR due to pulling from Pyxis.  Niels Molt, PharmD

## 2023-12-11 NOTE — Progress Notes (Signed)
 Patients port flushed without difficulty.  Good blood return noted with no bruising or swelling noted at site.  Labs drawn per orders.  VSS with discharge and left in satisfactory condition with no s/s of distress noted. All follow ups as scheduled.       Kierre Deines

## 2023-12-11 NOTE — Assessment & Plan Note (Signed)
 Patient is currently on Dilaudid  2 mg every 6 hours as needed.

## 2023-12-15 ENCOUNTER — Inpatient Hospital Stay: Admitting: Licensed Clinical Social Worker

## 2023-12-15 DIAGNOSIS — C9000 Multiple myeloma not having achieved remission: Secondary | ICD-10-CM

## 2023-12-16 ENCOUNTER — Encounter (HOSPITAL_COMMUNITY): Payer: Self-pay | Admitting: Oncology

## 2023-12-16 NOTE — Telephone Encounter (Signed)
 Order faxed and confirmation received.

## 2023-12-16 NOTE — Telephone Encounter (Signed)
 Per patient's wife, lenalidomide  was delivered on 12/12/23. She is taking it over to the facility today 12/16/23.   Messaged Tomi about faxing over a signed order from Dr. Davonna for the facility to administer the medication to Frank [name]. She responded that she attempted yesterday 12/15/23 to fax but could not get it to go through and would reattempt to fax today 12/16/23.

## 2023-12-16 NOTE — Progress Notes (Signed)
 [facility] CSW Progress Note  Clinical Child psychotherapist contacted caregiver by phone to follow-up on medication concerns.    Interventions: CSW spoke w/ pt's wife regarding medication concerns.  Pt's wife requesting the order for a new prescription be faxed to pt's facility w/ a request that pt receive the medication at 8:00pm.  CSW informed RN of the above.      Follow Up Plan:  Patient will contact CSW with any support or resource needs    Devere JONELLE Manna, LCSW Clinical Social Worker [facility]

## 2023-12-18 ENCOUNTER — Inpatient Hospital Stay

## 2023-12-18 ENCOUNTER — Inpatient Hospital Stay: Attending: Oncology

## 2023-12-18 VITALS — BP 115/90 | HR 89 | Temp 96.9°F | Resp 20 | Ht 72.0 in

## 2023-12-18 DIAGNOSIS — Z7961 Long term (current) use of immunomodulator: Secondary | ICD-10-CM | POA: Diagnosis not present

## 2023-12-18 DIAGNOSIS — Z7409 Other reduced mobility: Secondary | ICD-10-CM | POA: Diagnosis not present

## 2023-12-18 DIAGNOSIS — N179 Acute kidney failure, unspecified: Secondary | ICD-10-CM | POA: Diagnosis not present

## 2023-12-18 DIAGNOSIS — D649 Anemia, unspecified: Secondary | ICD-10-CM | POA: Diagnosis not present

## 2023-12-18 DIAGNOSIS — C9000 Multiple myeloma not having achieved remission: Secondary | ICD-10-CM | POA: Insufficient documentation

## 2023-12-18 DIAGNOSIS — Z5112 Encounter for antineoplastic immunotherapy: Secondary | ICD-10-CM | POA: Insufficient documentation

## 2023-12-18 DIAGNOSIS — Z79899 Other long term (current) drug therapy: Secondary | ICD-10-CM | POA: Diagnosis not present

## 2023-12-18 DIAGNOSIS — Z79624 Long term (current) use of inhibitors of nucleotide synthesis: Secondary | ICD-10-CM | POA: Insufficient documentation

## 2023-12-18 DIAGNOSIS — Z887 Allergy status to serum and vaccine status: Secondary | ICD-10-CM | POA: Diagnosis not present

## 2023-12-18 DIAGNOSIS — S7001XA Contusion of right hip, initial encounter: Secondary | ICD-10-CM | POA: Diagnosis not present

## 2023-12-18 DIAGNOSIS — J9811 Atelectasis: Secondary | ICD-10-CM | POA: Diagnosis not present

## 2023-12-18 DIAGNOSIS — Z79620 Long term (current) use of immunosuppressive biologic: Secondary | ICD-10-CM | POA: Insufficient documentation

## 2023-12-18 DIAGNOSIS — J9690 Respiratory failure, unspecified, unspecified whether with hypoxia or hypercapnia: Secondary | ICD-10-CM | POA: Insufficient documentation

## 2023-12-18 DIAGNOSIS — S42302A Unspecified fracture of shaft of humerus, left arm, initial encounter for closed fracture: Secondary | ICD-10-CM | POA: Insufficient documentation

## 2023-12-18 DIAGNOSIS — Z888 Allergy status to other drugs, medicaments and biological substances status: Secondary | ICD-10-CM | POA: Diagnosis not present

## 2023-12-18 DIAGNOSIS — M549 Dorsalgia, unspecified: Secondary | ICD-10-CM | POA: Insufficient documentation

## 2023-12-18 DIAGNOSIS — G8929 Other chronic pain: Secondary | ICD-10-CM | POA: Insufficient documentation

## 2023-12-18 DIAGNOSIS — Z885 Allergy status to narcotic agent status: Secondary | ICD-10-CM | POA: Diagnosis not present

## 2023-12-18 LAB — COMPREHENSIVE METABOLIC PANEL WITH GFR
ALT: 8 U/L (ref 0–44)
AST: <10 U/L — ABNORMAL LOW (ref 15–41)
Albumin: 3.4 g/dL — ABNORMAL LOW (ref 3.5–5.0)
Alkaline Phosphatase: 54 U/L (ref 38–126)
Anion gap: 11 (ref 5–15)
BUN: 21 mg/dL — ABNORMAL HIGH (ref 6–20)
CO2: 25 mmol/L (ref 22–32)
Calcium: 9.2 mg/dL (ref 8.9–10.3)
Chloride: 103 mmol/L (ref 98–111)
Creatinine, Ser: 0.87 mg/dL (ref 0.61–1.24)
GFR, Estimated: >60 mL/min (ref >60)
Glucose, Bld: 154 mg/dL — ABNORMAL HIGH (ref 70–99)
Potassium: 3.8 mmol/L (ref 3.5–5.1)
Sodium: 138 mmol/L (ref 135–145)
Total Bilirubin: 0.2 mg/dL (ref 0.0–1.2)
Total Protein: 5.4 g/dL — ABNORMAL LOW (ref 6.5–8.1)

## 2023-12-18 LAB — CBC WITH DIFFERENTIAL/PLATELET
Abs Immature Granulocytes: 0.04 K/uL (ref 0.00–0.07)
Basophils Absolute: 0 K/uL (ref 0.0–0.1)
Basophils Relative: 0 %
Eosinophils Absolute: 0.1 K/uL (ref 0.0–0.5)
Eosinophils Relative: 2 %
HCT: 37 % — ABNORMAL LOW (ref 39.0–52.0)
Hemoglobin: 11.8 g/dL — ABNORMAL LOW (ref 13.0–17.0)
Immature Granulocytes: 1 %
Lymphocytes Relative: 13 %
Lymphs Abs: 0.7 K/uL (ref 0.7–4.0)
MCH: 29.2 pg (ref 26.0–34.0)
MCHC: 31.9 g/dL (ref 30.0–36.0)
MCV: 91.6 fL (ref 80.0–100.0)
Monocytes Absolute: 0.6 K/uL (ref 0.1–1.0)
Monocytes Relative: 11 %
Neutro Abs: 4.1 K/uL (ref 1.7–7.7)
Neutrophils Relative %: 73 %
Platelets: 245 K/uL (ref 150–400)
RBC: 4.04 MIL/uL — ABNORMAL LOW (ref 4.22–5.81)
RDW: 18.7 % — ABNORMAL HIGH (ref 11.5–15.5)
WBC: 5.6 K/uL (ref 4.0–10.5)
nRBC: 0 % (ref 0.0–0.2)

## 2023-12-18 LAB — MAGNESIUM: Magnesium: 1.9 mg/dL (ref 1.7–2.4)

## 2023-12-18 MED ORDER — CLONIDINE HCL 0.1 MG PO TABS
0.2000 mg | ORAL_TABLET | Freq: Once | ORAL | Status: AC
  Administered 2023-12-18: 0.2 mg via ORAL
  Filled 2023-12-18: qty 2

## 2023-12-18 MED ORDER — SODIUM CHLORIDE 0.9 % IV SOLN
10.0000 mg/kg | Freq: Once | INTRAVENOUS | Status: AC
  Administered 2023-12-18: 1000 mg via INTRAVENOUS
  Filled 2023-12-18: qty 50

## 2023-12-18 MED ORDER — HYDROMORPHONE HCL 1 MG/ML IJ SOLN
2.0000 mg | Freq: Once | INTRAMUSCULAR | Status: AC
  Administered 2023-12-18: 2 mg via INTRAVENOUS
  Filled 2023-12-18: qty 2

## 2023-12-18 MED ORDER — DIPHENHYDRAMINE HCL 50 MG/ML IJ SOLN
50.0000 mg | Freq: Once | INTRAMUSCULAR | Status: AC
  Administered 2023-12-18: 50 mg via INTRAVENOUS
  Filled 2023-12-18: qty 1

## 2023-12-18 MED ORDER — DEXTROSE 5 % IV SOLN
56.0000 mg/m2 | Freq: Once | INTRAVENOUS | Status: AC
  Administered 2023-12-18: 120 mg via INTRAVENOUS
  Filled 2023-12-18: qty 60

## 2023-12-18 MED ORDER — PALONOSETRON HCL INJECTION 0.25 MG/5ML
0.2500 mg | Freq: Once | INTRAVENOUS | Status: AC
  Administered 2023-12-18: 0.25 mg via INTRAVENOUS
  Filled 2023-12-18: qty 5

## 2023-12-18 MED ORDER — OXYCODONE HCL 5 MG PO TABS
5.0000 mg | ORAL_TABLET | Freq: Once | ORAL | Status: AC
  Administered 2023-12-18: 5 mg via ORAL
  Filled 2023-12-18: qty 1

## 2023-12-18 MED ORDER — FAMOTIDINE IN NACL 20-0.9 MG/50ML-% IV SOLN
20.0000 mg | Freq: Once | INTRAVENOUS | Status: AC
  Administered 2023-12-18: 20 mg via INTRAVENOUS
  Filled 2023-12-18: qty 50

## 2023-12-18 MED ORDER — SODIUM CHLORIDE 0.9 % IV SOLN: Freq: Once | INTRAVENOUS | Status: AC

## 2023-12-18 MED ORDER — SODIUM CHLORIDE 0.9 % IV SOLN: INTRAVENOUS | Status: DC

## 2023-12-18 MED ORDER — SODIUM CHLORIDE 0.9 % IV SOLN
20.0000 mg | Freq: Once | INTRAVENOUS | Status: AC
  Administered 2023-12-18: 20 mg via INTRAVENOUS
  Filled 2023-12-18: qty 20

## 2023-12-18 MED ORDER — ACETAMINOPHEN 325 MG PO TABS
650.0000 mg | ORAL_TABLET | Freq: Once | ORAL | Status: AC
  Administered 2023-12-18: 650 mg via ORAL
  Filled 2023-12-18: qty 2

## 2023-12-18 NOTE — Patient Instructions (Signed)
 [facility] [place] - A DEPT OF [facility]  Discharge Instructions: Thank you for choosing [facility] to provide your oncology and hematology care.  If you have a lab appointment with the Cancer Center - please note that after April 8th, 2024, all labs will be drawn in the cancer center.  You do not have to check in or register with the main entrance as you have in the past but will complete your check-in in the cancer center.  Wear comfortable clothing and clothing appropriate for easy access to any Portacath or PICC line.   We strive to give you quality time with your provider. You may need to reschedule your appointment if you arrive late (15 or more minutes).  Arriving late affects you and other patients whose appointments are after yours.  Also, if you miss three or more appointments without notifying the office, you may be dismissed from the clinic at the provider's discretion.      For prescription refill requests, have your pharmacy contact our office and allow 72 hours for refills to be completed.    Today you received the following chemotherapy and/or immunotherapy agents kadcyla and Sarclisa .       To help prevent nausea and vomiting after your treatment, we encourage you to take your nausea medication as directed.  BELOW ARE SYMPTOMS THAT SHOULD BE REPORTED IMMEDIATELY: *FEVER GREATER THAN 100.4 F (38 C) OR HIGHER *CHILLS OR SWEATING *NAUSEA AND VOMITING THAT IS NOT CONTROLLED WITH YOUR NAUSEA MEDICATION *UNUSUAL SHORTNESS OF BREATH *UNUSUAL BRUISING OR BLEEDING *URINARY PROBLEMS (pain or burning when urinating, or frequent urination) *BOWEL PROBLEMS (unusual diarrhea, constipation, pain near the anus) TENDERNESS IN MOUTH AND THROAT WITH OR WITHOUT PRESENCE OF ULCERS (sore throat, sores in mouth, or a toothache) UNUSUAL RASH, SWELLING OR PAIN  UNUSUAL VAGINAL DISCHARGE OR ITCHING   Items with * indicate a potential emergency and should  be followed up as soon as possible or go to the Emergency Department if any problems should occur.  Please show the CHEMOTHERAPY ALERT CARD or IMMUNOTHERAPY ALERT CARD at check-in to the Emergency Department and triage nurse.  Should you have questions after your visit or need to cancel or reschedule your appointment, please contact [facility] [place] - A DEPT OF [facility] [phone]  and follow the prompts.  Office hours are 8:00 a.m. to 4:30 p.m. Monday - Friday. Please note that voicemails left after 4:00 p.m. may not be returned until the following business day.  We are closed weekends and major holidays. You have access to a nurse at all times for urgent questions. Please call the main number to the clinic [phone] and follow the prompts.  For any non-urgent questions, you may also contact your provider using MyChart. We now offer e-Visits for anyone 86 and older to request care online for non-urgent symptoms. For details visit [email or web address].   Also download the MyChart app! Go to the app store, search MyChart, open the app, select [place], and log in with your MyChart username and password.

## 2023-12-18 NOTE — Progress Notes (Signed)
   12/18/23 1000  Spiritual Encounters  Type of Visit Initial  Care provided to: Patient  Conversation partners present during encounter Nurse  Referral source Other (comment) (Chaplain rounding on Floor)  Reason for visit  (Introduction to Spiritual Care)  OnCall Visit No  Spiritual Framework  Presenting Themes Meaning/purpose/sources of inspiration;Goals in life/care;Values and beliefs;Significant life change;Caregiving needs;Coping tools;Impactful experiences and emotions;Courage hope and growth  Community/Connection Family;Faith community  Patient Stress Factors None identified  Family Stress Factors Exhausted  Interventions  Spiritual Care Interventions Made Established relationship of care and support;Compassionate presence;Reflective listening;Narrative/life review;Explored values/beliefs/practices/strengths;Encouragement  Intervention Outcomes  Outcomes Connection to spiritual care  Spiritual Care Plan  Spiritual Care Issues Still Outstanding Chaplain will continue to follow   Reason for Visit: Chaplain identified Pt on the schedule as a Pt I had not connected with yet and visited to deliver introduction to Spiritual Care  Description of Visit: Upon arrival I found [name] lying in the hospital bed receiving his treatment with no support person present.  I introduced myself as the chaplain for the cancer center and offered a brief education on the role of a chaplain and the support we can offer to our patients, caregivers, and staff.    [name] began to share with his cancer journey which began 8 months ago with [place] spending several weeks in a coma.  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  He is currently in a rehab facility in [place] doing PT.  As I explored Edd[place] needs and resources he revealed that his wife is his main caretaker, and she is also caring for her elderly mother who is suffering with advanced  dementia and cannot be left alone.  [name] became emotional as he spoke of the burden that his wife is under and his appreciation for her.  [name] excitedly shared with me that when he was at [facility] he was baptized and saved and he went on to talk about all the ways he has been transformed since then.  He even declared, "Everyday I feel like my heart is going to jump out of my chest for joy!"  Plan of Care: I will continue to follow up with [name] on a bi-weekly basis, and I hope to connect with his wife, as well, and hope to be a support to her as well.   [name], MDiv  Chaplain, [facility] [name] [phone]

## 2023-12-18 NOTE — Progress Notes (Signed)
 Patient from nursing home and did not receive his morning medicaitons.  BP 139/104 with Clonidine  0.2mg  given per standing orders and Dr. Davonna notified.   Patient tolerated chemotherapy with no complaints voiced.  Side effects with management reviewed with understanding verbalized.  Port site clean and dry with no bruising or swelling noted at site.  Good blood return noted before and after administration of chemotherapy.  Band aid applied.  Patient left in satisfactory condition with VSS and no s/s of distress noted.

## 2023-12-19 LAB — KAPPA/LAMBDA LIGHT CHAINS
Kappa free light chain: 15.2 mg/L (ref 3.3–19.4)
Kappa, lambda light chain ratio: 1.36 (ref 0.26–1.65)
Lambda free light chains: 11.2 mg/L (ref 5.7–26.3)

## 2023-12-22 LAB — MULTIPLE MYELOMA PANEL, SERUM
Albumin SerPl Elph-Mcnc: 2.8 g/dL — ABNORMAL LOW (ref 2.9–4.4)
Albumin/Glob SerPl: 1.4 (ref 0.7–1.7)
Alpha 1: 0.2 g/dL (ref 0.0–0.4)
Alpha2 Glob SerPl Elph-Mcnc: 0.7 g/dL (ref 0.4–1.0)
B-Globulin SerPl Elph-Mcnc: 0.7 g/dL (ref 0.7–1.3)
Gamma Glob SerPl Elph-Mcnc: 0.6 g/dL (ref 0.4–1.8)
Globulin, Total: 2.1 g/dL — ABNORMAL LOW (ref 2.2–3.9)
IgA: 62 mg/dL — ABNORMAL LOW (ref 90–386)
IgG (Immunoglobin G), Serum: 785 mg/dL (ref 603–1613)
IgM (Immunoglobulin M), Srm: 8 mg/dL — ABNORMAL LOW (ref 20–172)
M Protein SerPl Elph-Mcnc: 0.3 g/dL — ABNORMAL HIGH
Total Protein ELP: 4.9 g/dL — ABNORMAL LOW (ref 6.0–8.5)

## 2023-12-25 ENCOUNTER — Other Ambulatory Visit: Payer: Self-pay | Admitting: Oncology

## 2023-12-25 DIAGNOSIS — C9000 Multiple myeloma not having achieved remission: Secondary | ICD-10-CM

## 2023-12-26 NOTE — Progress Notes (Signed)
 Patient called requesting a call back with no further details. Tried calling and phone line said not available and the line hung up.

## 2024-01-01 ENCOUNTER — Inpatient Hospital Stay

## 2024-01-01 ENCOUNTER — Other Ambulatory Visit: Payer: Self-pay

## 2024-01-01 ENCOUNTER — Inpatient Hospital Stay: Admitting: Oncology

## 2024-01-01 VITALS — BP 125/88 | HR 93 | Temp 97.8°F | Resp 18

## 2024-01-01 DIAGNOSIS — C9000 Multiple myeloma not having achieved remission: Secondary | ICD-10-CM

## 2024-01-01 DIAGNOSIS — G893 Neoplasm related pain (acute) (chronic): Secondary | ICD-10-CM

## 2024-01-01 DIAGNOSIS — D649 Anemia, unspecified: Secondary | ICD-10-CM

## 2024-01-01 DIAGNOSIS — Z5112 Encounter for antineoplastic immunotherapy: Secondary | ICD-10-CM | POA: Diagnosis not present

## 2024-01-01 DIAGNOSIS — N179 Acute kidney failure, unspecified: Secondary | ICD-10-CM | POA: Diagnosis not present

## 2024-01-01 LAB — COMPREHENSIVE METABOLIC PANEL WITH GFR
ALT: 9 U/L (ref 0–44)
AST: <10 U/L — ABNORMAL LOW (ref 15–41)
Albumin: 3.3 g/dL — ABNORMAL LOW (ref 3.5–5.0)
Alkaline Phosphatase: 53 U/L (ref 38–126)
Anion gap: 9 (ref 5–15)
BUN: 28 mg/dL — ABNORMAL HIGH (ref 6–20)
CO2: 27 mmol/L (ref 22–32)
Calcium: 9.4 mg/dL (ref 8.9–10.3)
Chloride: 105 mmol/L (ref 98–111)
Creatinine, Ser: 1.34 mg/dL — ABNORMAL HIGH (ref 0.61–1.24)
GFR, Estimated: >60 mL/min (ref >60)
Glucose, Bld: 111 mg/dL — ABNORMAL HIGH (ref 70–99)
Potassium: 4.1 mmol/L (ref 3.5–5.1)
Sodium: 141 mmol/L (ref 135–145)
Total Bilirubin: 0.3 mg/dL (ref 0.0–1.2)
Total Protein: 5.4 g/dL — ABNORMAL LOW (ref 6.5–8.1)

## 2024-01-01 LAB — CBC WITH DIFFERENTIAL/PLATELET
Abs Immature Granulocytes: 0.01 K/uL (ref 0.00–0.07)
Basophils Absolute: 0 K/uL (ref 0.0–0.1)
Basophils Relative: 1 %
Eosinophils Absolute: 0.1 K/uL (ref 0.0–0.5)
Eosinophils Relative: 3 %
HCT: 33.5 % — ABNORMAL LOW (ref 39.0–52.0)
Hemoglobin: 10.7 g/dL — ABNORMAL LOW (ref 13.0–17.0)
Immature Granulocytes: 0 %
Lymphocytes Relative: 26 %
Lymphs Abs: 1 K/uL (ref 0.7–4.0)
MCH: 29 pg (ref 26.0–34.0)
MCHC: 31.9 g/dL (ref 30.0–36.0)
MCV: 90.8 fL (ref 80.0–100.0)
Monocytes Absolute: 0.6 K/uL (ref 0.1–1.0)
Monocytes Relative: 18 %
Neutro Abs: 1.9 K/uL (ref 1.7–7.7)
Neutrophils Relative %: 52 %
Platelets: 187 K/uL (ref 150–400)
RBC: 3.69 MIL/uL — ABNORMAL LOW (ref 4.22–5.81)
RDW: 19 % — ABNORMAL HIGH (ref 11.5–15.5)
WBC: 3.6 K/uL — ABNORMAL LOW (ref 4.0–10.5)
nRBC: 0 % (ref 0.0–0.2)

## 2024-01-01 LAB — MAGNESIUM: Magnesium: 1.9 mg/dL (ref 1.7–2.4)

## 2024-01-01 MED ORDER — ACETAMINOPHEN 325 MG PO TABS
650.0000 mg | ORAL_TABLET | Freq: Once | ORAL | Status: AC
  Administered 2024-01-01: 650 mg via ORAL
  Filled 2024-01-01: qty 2

## 2024-01-01 MED ORDER — OXYCODONE HCL 5 MG PO TABS
5.0000 mg | ORAL_TABLET | Freq: Once | ORAL | Status: AC
  Administered 2024-01-01: 5 mg via ORAL
  Filled 2024-01-01: qty 1

## 2024-01-01 MED ORDER — DEXTROSE 5 % IV SOLN
56.0000 mg/m2 | Freq: Once | INTRAVENOUS | Status: AC
  Administered 2024-01-01: 120 mg via INTRAVENOUS
  Filled 2024-01-01: qty 60

## 2024-01-01 MED ORDER — SODIUM CHLORIDE 0.9 % IV SOLN: Freq: Once | INTRAVENOUS | Status: AC

## 2024-01-01 MED ORDER — SODIUM CHLORIDE 0.9 % IV SOLN
10.0000 mg/kg | Freq: Once | INTRAVENOUS | Status: AC
  Administered 2024-01-01: 1000 mg via INTRAVENOUS
  Filled 2024-01-01: qty 50

## 2024-01-01 MED ORDER — SODIUM CHLORIDE 0.9 % IV SOLN: Freq: Once | INTRAVENOUS | Status: DC

## 2024-01-01 MED ORDER — SODIUM CHLORIDE 0.9 % IV SOLN: INTRAVENOUS | Status: DC

## 2024-01-01 MED ORDER — DIPHENHYDRAMINE HCL 50 MG/ML IJ SOLN
50.0000 mg | Freq: Once | INTRAMUSCULAR | Status: AC
  Administered 2024-01-01: 50 mg via INTRAVENOUS
  Filled 2024-01-01: qty 1

## 2024-01-01 MED ORDER — PALONOSETRON HCL INJECTION 0.25 MG/5ML
0.2500 mg | Freq: Once | INTRAVENOUS | Status: AC
  Administered 2024-01-01: 0.25 mg via INTRAVENOUS
  Filled 2024-01-01: qty 5

## 2024-01-01 MED ORDER — FAMOTIDINE IN NACL 20-0.9 MG/50ML-% IV SOLN
20.0000 mg | Freq: Once | INTRAVENOUS | Status: AC
  Administered 2024-01-01: 20 mg via INTRAVENOUS
  Filled 2024-01-01: qty 50

## 2024-01-01 MED ORDER — HYDROMORPHONE HCL 1 MG/ML IJ SOLN
2.0000 mg | Freq: Once | INTRAMUSCULAR | Status: AC
  Administered 2024-01-01: 2 mg via INTRAVENOUS
  Filled 2024-01-01: qty 2

## 2024-01-01 MED ORDER — LENALIDOMIDE 15 MG PO CAPS: 15.0000 mg | ORAL_CAPSULE | Freq: Every day | ORAL | 0 refills | Status: DC

## 2024-01-01 MED ORDER — SODIUM CHLORIDE 0.9 % IV SOLN
20.0000 mg | Freq: Once | INTRAVENOUS | Status: AC
  Administered 2024-01-01: 20 mg via INTRAVENOUS
  Filled 2024-01-01: qty 20

## 2024-01-01 NOTE — Progress Notes (Signed)
 Patient presents today for chemotherapy Kyprolis  infusion. Patient is in satisfactory condition with no new complaints voiced.  Vital signs are stable.  Labs reviewed by Dr. Davonna during the office visit and all labs are within treatment parameters. Patient will receive 2 mg IV Dilaudid  and Oxycodone  5 mg prior to discharge. Patient will also receive 1 of NS over 1 hour and the 250 mL NS over 30 minutes prior to Kyprolis  will be administered through the 1Liter of NS bag per Dr.Kandala. We will proceed with treatment per MD orders.   Treatment given today per MD orders. Tolerated infusion without adverse affects. Vital signs stable. No complaints at this time. Discharged from clinic via stretcher in stable condition. Alert and oriented x 3. F/U with [facility] as scheduled.

## 2024-01-01 NOTE — Telephone Encounter (Signed)
 Chart reviewed. Revlimid  refilled per last office note with Dr. Davonna.

## 2024-01-01 NOTE — Progress Notes (Signed)
 Patient Care Team: [name], MD as PCP - General (Family Medicine) Kate Lonni CROME, MD as PCP - Cardiology (Cardiology) Davonna Siad, MD as Medical Oncologist (Medical Oncology) Celestia Joesph SQUIBB, RN as Oncology Nurse Navigator (Medical Oncology)  Clinic Day:  01/01/2024  Referring physician: [name], *   CHIEF COMPLAINT:  CC: Multiple myeloma   ASSESSMENT & PLAN:   Assessment & Plan: Frank [name].  is a 58 y.o. male with multiple myeloma  Assessment and Plan Assessment & Plan Multiple myeloma R-ISS stage III, standard risk, IgG kappa light chain multiple myeloma Oncology history as above Patient was hospitalized for several months and received 2 cycles of with Velcade , Cytoxan , dexamethasone  followed by 2 cycles of Isatuximab , carfilzomib , Cytoxan  and dexamethasone .  Cytoxan  replaced by Revlimid  15 mg 3 weeks on/1 week off   - Labs reviewed today: CMP: Creatinine: 1.34, normal LFTs, calcium : 9.4, CBC: Hemoglobin: 10.7, WBC: 3.6, platelets: 187.  Previous SPEP: M spike: 0.3, free light chains: Normal, normal ratio. - Cycle 4-day 1 of carfilzomib , Isatuximab .  Continue at the same doses today. - Revlimid  15 mg 3 weeks on/1 week off-continue with the same doses - Physical exam stable today.  Okay to proceed with treatment - Continue acyclovir  and aspirin  at home  Return to clinic in 1 month prior to cycle 4 to assess for tolerance and response.  Anemia Anemia may be related to Revlimid  treatment. No immediate intervention required.  - Monitor hemoglobin levels regularly. - Will recheck iron panel with next blood draw  Hematoma Right hip hematoma increasing in size. Plans for drain insertion next week.   - Administer pain medication as needed.   Chronic pain Chronic pain managed with Dilaudid , exacerbated by travel and recent procedures.  -Will administer Dilaudid  IV for pain management in the clinic - Will provide  oxycodone  5 mg prior to leaving the clinic.  AKI Likely related to dehydration -Will administer IV fluids to improve kidney function.  Impaired mobility Impaired mobility with hesitancy to take first steps. Encouragement to increase activity to prevent deconditioning.  - Encouraged increased physical activity and standing to improve mobility.  The patient understands the plans discussed today and is in agreement with them.  He knows to contact our office if he develops concerns prior to his next appointment.  The total time spent in the appointment was 30 minutes for the encounter with patient, including review of chart and various tests results, discussions about plan of care and coordination of care plan  Siad Davonna, MD  [facility] [place] - A DEPT OF [facility] [address] [place] [address] Dept: [phone] Dept Fax: [phone]   No orders of the defined types were placed in this encounter.    ONCOLOGY HISTORY:   Diagnosis: Multiple Myeloma   Presentation: Altered mental status, generalized weakness, hypercalcemia and anemia requiring blood transfusions. -06/29/2023-07/02/2023:Admitted to [facility] for altered mental status, sepsis, hypercalcemia, symptomatic anemia and was found to have lytic bone lesions.  -07/03/2023: Patient was transferred from [facility] for concern for multiple myeloma.  -07/03/2023-11/07/2023: Hospital course: Patient had left humeral fracture, intubated for respiratory failure, and was requiring multiple transfusions. He was trached and vented. Was also on hemodialysis briefly. Discharged to [facility] on 11/07/2023 -07/03/2023: Multiple myeloma labs: KFLC:108.3, LFLC:8, Ratio:13.54. Cr:4.32, LDH:125, Hb:7.2,IgG:11,507, IgM:5, IgA:74.             -Urine: KFLC: 354.79,LFLC: 34.89, ratio :  34.89. 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              -MM FISH: 1p deletion:detected. 13q del, TP 53,t(11;14),t(4;14),t(14;16),t(14;20): not detected             -Cytogenetics: Complex karyotype -07/08/2023-08/12/2023: Velcade , cyclophosphamide  and dexamethasone . Changed for light chains trending up. -07/22/2023: SPEP: M spike:3.1, IFE:The SPE pattern demonstrates a single peak (M-spike) in the gamma region which may represent monoclonal protein.              -KFLC:138, LFLC:19.9, ratio:6.93       -08/02/2023: XQOR:730.6, LFLC:47.3, Ratio:5.69. SPEP:M spike:2.8, IgG: 4543 -08/21/2023-11/04/2023: Carfilzomib  + Cytoxan  + Isatuximab . Received 2 cycles. Second cycle delayed for humeral surgery -10/13/2023: Left arm amputation: Involvement by the patient's known plasma cell myeloma.  - 12/04/2023-current: Carfilzomib  + Isatuximab  + Revlimid   Current Treatment:  Carfilzomib + Isatuximab + Revlimid   INTERVAL HISTORY:   Discussed the use of AI scribe software for clinical note transcription with the patient, who gave verbal consent to proceed.  History of Present Illness Frank [name]. is a 58 year old male with multiple myeloma who presents for follow-up.  He experiences significant pain in his back and right hip, currently rated at 8.5 out of 10, which worsens during trips and after medical procedures, such as fluid aspiration from the hematoma. The hematoma, initially thought to be healing, has been increasing in size, and he is scheduled for drain placement next week.  He resides in a nursing home for rehabilitation, where he is not yet able to use the bathroom independently and relies on briefs. He has stood up a few times but is hesitant to take steps, impacting his mobility  and rehabilitation progress.  His appetite is generally good, although he did not eat breakfast on the day of the visit.  Apart from the pain, patient has been doing well overall.   I have reviewed the past medical history, past surgical history, social history and family history with the patient and they are unchanged from previous note.  ALLERGIES:  is allergic to morphine  and codeine, shellfish allergy, bupropion, chlorhexidine , codeine, influenza vaccines, morphine , povidone iodine , and metoprolol .  MEDICATIONS:  Current Outpatient Medications  Medication Sig Dispense Refill   acyclovir  (ZOVIRAX ) 200 MG capsule Take 2 capsules (400 mg total) by mouth 2 (two) times daily.     albuterol  (PROVENTIL  HFA;VENTOLIN  HFA) 108 (90 Base) MCG/ACT inhaler Inhale 2 puffs into the lungs every 6 (six) hours as needed for wheezing or shortness of breath.     albuterol  (PROVENTIL ) (2.5 MG/3ML) 0.083% nebulizer solution 2.5 mg every 2 (two) hours as needed for wheezing or shortness of breath.     amLODipine  (NORVASC ) 5 MG tablet Take by mouth.     apixaban  (ELIQUIS ) 5 MG TABS tablet Take by mouth.     atorvastatin  (LIPITOR) 40 MG tablet Take 40 mg by mouth.     bisacodyl (DULCOLAX) 10 MG suppository Place rectally.     busPIRone  (BUSPAR ) 15 MG tablet Take 15 mg by mouth 2 (two) times daily.     celecoxib  (CELEBREX ) 200 MG capsule Take 1 capsule every day by oral  route for 180 days.     chlorthalidone (HYGROTON) 25 MG tablet Take by mouth.     collagenase  (SANTYL ) 250 UNIT/GM ointment Apply topically daily.     cyclobenzaprine  (FLEXERIL ) 5 MG tablet Take 1 tablet (5 mg total) by mouth 3 (three) times daily as needed for muscle spasms. (Patient taking differently: Take 5 mg by mouth daily as needed for muscle spasms.) 30 tablet 0   divalproex  (DEPAKOTE ) 500 MG DR tablet Take 500 mg by mouth 3 (three) times daily.     docusate sodium  (COLACE) 100 MG capsule Take by mouth.     EPINEPHrine  0.3 mg/0.3 mL IJ  SOAJ injection epinephrine  0.3 mg/0.3 mL auto-injector     ergocalciferol  (VITAMIN D2) 1.25 MG (50000 UT) capsule Take 50,000 Units by mouth once a week.     esomeprazole (NEXIUM) 40 MG capsule Take by mouth.     fluticasone -salmeterol (ADVAIR) 500-50 MCG/ACT AEPB      furosemide  (LASIX ) 40 MG tablet      gabapentin  (NEURONTIN ) 300 MG capsule Take 300 mg by mouth.     glimepiride  (AMARYL ) 2 MG tablet Take 2 mg by mouth.     hydrALAZINE  (APRESOLINE ) 25 MG tablet Take by mouth.     HYDROcodone -acetaminophen  (NORCO/VICODIN) 5-325 MG tablet Take 1 tablet by mouth every 6 (six) hours as needed.     HYDROmorphone  (DILAUDID ) 2 MG tablet Take by mouth.     hydrOXYzine  (ATARAX ) 10 MG tablet Take 1 tablet (10 mg total) by mouth 3 (three) times daily as needed for anxiety.     insulin  aspart (NOVOLOG ) 100 UNIT/ML injection Inject 0-15 Units into the skin 4 (four) times daily -  before meals and at bedtime.     insulin  aspart (NOVOLOG ) 100 UNIT/ML injection Inject 2 Units into the skin 4 (four) times daily -  before meals and at bedtime.     insulin  glargine (LANTUS  SOLOSTAR) 100 UNIT/ML Solostar Pen Inject into the skin.     lidocaine  (LIDODERM ) 5 % Place 1 patch onto the skin daily. Remove & Discard patch within 12 hours or as directed by MD     lisinopril  (ZESTRIL ) 40 MG tablet Take 1 tablet every day by oral route, for hypertension.     metFORMIN  (GLUCOPHAGE ) 1000 MG tablet Take 1 tablet twice a day by oral route for 100 days, for diabetes.     montelukast  (SINGULAIR ) 5 MG chewable tablet Chew 1 tablet (5 mg total) by mouth at bedtime.     omeprazole (PRILOSEC) 20 MG capsule Take by mouth.     PARoxetine  (PAXIL ) 40 MG tablet Take 40 mg by mouth every morning.     polyethylene glycol (MIRALAX  / GLYCOLAX ) 17 g packet Take 17 g by mouth daily as needed for mild constipation.     spironolactone  (ALDACTONE ) 25 MG tablet Take 1 tablet every day by oral route for 100 days, for EDEMA.     tamsulosin  (FLOMAX )  0.4 MG CAPS capsule Take 0.4 mg by mouth at bedtime.     testosterone  cypionate (DEPOTESTOSTERONE CYPIONATE) 200 MG/ML injection Inject 200 mg into the muscle every 14 (fourteen) days.     TRUE METRIX BLOOD GLUCOSE TEST test strip SMARTSIG:Via Meter     umeclidinium-vilanterol (ANORO ELLIPTA ) 62.5-25 MCG/ACT AEPB Inhale 1 puff into the lungs daily.     valproic  acid (DEPAKENE ) 250 MG capsule Take by mouth.     valproic  acid (DEPAKENE ) 250 MG/5ML solution Take 15 mLs (750 mg total) by mouth 2 (  two) times daily.     verapamil  (CALAN ) 120 MG tablet Take by mouth.     acetaminophen  (TYLENOL ) 325 MG tablet      lenalidomide  (REVLIMID ) 15 MG capsule Take 1 capsule (15 mg total) by mouth daily. Take for 21 days, then hold for 7 days. Repeat every 28 days. 21 capsule 0   nicotine  (NICODERM CQ  - DOSED IN MG/24 HOURS) 14 mg/24hr patch Apply 1 patch every day by transdermal route.     No current facility-administered medications for this visit.    REVIEW OF SYSTEMS:   Constitutional: Denies fevers, chills or abnormal weight loss Eyes: Denies blurriness of vision Ears, nose, mouth, throat, and face: Denies mucositis or sore throat Respiratory: Denies cough, dyspnea or wheezes Cardiovascular: Denies palpitation, chest discomfort or lower extremity swelling Gastrointestinal:  Denies nausea, heartburn or change in bowel habits Skin: Denies abnormal skin rashes Lymphatics: Denies new lymphadenopathy or easy bruising Neurological:Denies numbness, tingling or new weaknesses Behavioral/Psych: Mood is stable, no new changes  All other systems were reviewed with the patient and are negative.   VITALS:  There were no vitals taken for this visit.  Wt Readings from Last 3 Encounters:  12/04/23 226 lb (102.5 kg)  11/04/23 250 lb 3.6 oz (113.5 kg)  06/05/23 250 lb 9.6 oz (113.7 kg)    There is no height or weight on file to calculate BMI.  Performance status (ECOG): 2 - Symptomatic, <50% confined to  bed  PHYSICAL EXAM:   GENERAL:alert, no distress and comfortable LYMPH:  no palpable lymphadenopathy in the cervical, axillary or inguinal LUNGS: clear to auscultation and percussion with normal breathing effort HEART: regular rate & rhythm and no murmurs and no lower extremity edema ABDOMEN:abdomen soft, non-tender and normal bowel sounds Musculoskeletal:no cyanosis of digits and no clubbing.  Left arm amputated.  Right hip tenderness present. NEURO: alert & oriented x 3 with fluent speech  LABORATORY DATA:  I have reviewed the data as listed     Component Value Date/Time   NA 141 01/01/2024 0804   K 4.1 01/01/2024 0804   CL 105 01/01/2024 0804   CO2 27 01/01/2024 0804   GLUCOSE 111 (H) 01/01/2024 0804   BUN 28 (H) 01/01/2024 0804   CREATININE 1.34 (H) 01/01/2024 0804   CALCIUM  9.4 01/01/2024 0804   PROT 5.4 (L) 01/01/2024 0804   ALBUMIN  3.3 (L) 01/01/2024 0804   AST <10 (L) 01/01/2024 0804   ALT 9 01/01/2024 0804   ALKPHOS 53 01/01/2024 0804   BILITOT 0.3 01/01/2024 0804   GFRNONAA >60 01/01/2024 0804   GFRAA >60 12/27/2017 0404     Lab Results  Component Value Date   WBC 3.6 (L) 01/01/2024   NEUTROABS 1.9 01/01/2024   HGB 10.7 (L) 01/01/2024   HCT 33.5 (L) 01/01/2024   MCV 90.8 01/01/2024   PLT 187 01/01/2024      Chemistry      Component Value Date/Time   NA 141 01/01/2024 0804   K 4.1 01/01/2024 0804   CL 105 01/01/2024 0804   CO2 27 01/01/2024 0804   BUN 28 (H) 01/01/2024 0804   CREATININE 1.34 (H) 01/01/2024 0804      Component Value Date/Time   CALCIUM  9.4 01/01/2024 0804   ALKPHOS 53 01/01/2024 0804   AST <10 (L) 01/01/2024 0804   ALT 9 01/01/2024 0804   BILITOT 0.3 01/01/2024 0804        Latest Reference Range & Units 12/18/23 08:45  Total Protein ELP  6.0 - 8.5 g/dL 4.9 (L) (C)  Albumin  SerPl Elph-Mcnc 2.9 - 4.4 g/dL 2.8 (L) (C)  Albumin /Glob SerPl 0.7 - 1.7  1.4 (C)  Alpha2 Glob SerPl Elph-Mcnc 0.4 - 1.0 g/dL 0.7 (C)  Alpha 1 0.0 - 0.4  g/dL 0.2 (C)  Gamma Glob SerPl Elph-Mcnc 0.4 - 1.8 g/dL 0.6 (C)  M Protein SerPl Elph-Mcnc Not Observed g/dL 0.3 (H) (C)  IFE 1  Comment ! (C)  Globulin, Total 2.2 - 3.9 g/dL 2.1 (L) (C)  B-Globulin SerPl Elph-Mcnc 0.7 - 1.3 g/dL 0.7 (C)  IgG (Immunoglobin G), Serum 603 - 1,613 mg/dL 214  IgM (Immunoglobulin M), Srm 20 - 172 mg/dL 8 (L)  IgA 90 - 613 mg/dL 62 (L)  (L): Data is abnormally low (H): Data is abnormally high !: Data is abnormal (C): Corrected   Latest Reference Range & Units 12/18/23 08:45  Kappa free light chain 3.3 - 19.4 mg/L 15.2  Lambda free light chains 5.7 - 26.3 mg/L 11.2  Kappa, lambda light chain ratio 0.26 - 1.65  1.36    RADIOGRAPHIC STUDIES: I have personally reviewed the radiological images as listed and agreed with the findings in the report.  CT ABDOMEN PELVIS W CONTRAST CLINICAL DATA:  Follow-up drainage catheter  EXAM: CT ABDOMEN AND PELVIS WITH CONTRAST  TECHNIQUE: Multidetector CT imaging of the abdomen and pelvis was performed using the standard protocol following bolus administration of intravenous contrast.  RADIATION DOSE REDUCTION: This exam was performed according to the departmental dose-optimization program which includes automated exposure control, adjustment of the mA and/or kV according to patient size and/or use of iterative reconstruction technique.  CONTRAST:  OMNIPAQUE  IOHEXOL  350 MG/ML SOLN  COMPARISON:  10/07/2023  FINDINGS: Lower chest: Patchy bibasilar atelectasis is noted worse on the right than the left.  Hepatobiliary: No focal liver abnormality is seen. No gallstones, gallbladder wall thickening, or biliary dilatation.  Pancreas: Unremarkable. No pancreatic ductal dilatation or surrounding inflammatory changes.  Spleen: Normal in size without focal abnormality.  Adrenals/Urinary Tract: Adrenal glands are within normal limits. Kidneys demonstrate scattered cysts bilaterally. No follow-up  is recommended. No renal calculi or obstructive changes are seen. The bladder is partially distended.  Stomach/Bowel: Scattered fecal material is noted throughout the colon without obstructive change. The appendix is within normal limits. Small bowel and stomach are within normal limits.  Vascular/Lymphatic: Aortic atherosclerosis. No enlarged abdominal or pelvic lymph nodes.  Reproductive: Prostate is unremarkable.  Other: No abdominal wall hernia or abnormality. No abdominopelvic ascites.  Musculoskeletal: Left hip replacement is noted. Degenerative changes of lumbar spine are seen. Chronic T11 compression fracture is noted. In the right buttock a pigtail drainage catheter is identified. There remains a fluid collection which measures approximately 11 by 5 cm in greatest transverse and AP dimensions respectively. The collection in the anterior right thigh is incompletely evaluated on this exam but measures at least 8.7 by 2.6 cm in transverse and AP dimensions respectively. This is slightly smaller seen on the prior study.  IMPRESSION: Fluid collections in the anterior right thigh and right buttock. Drainage catheter remains in the right buttock. The overall appearance is relatively stable.  The anterior right thigh collection is slightly smaller than that seen on the prior exam.  Patchy atelectatic changes in the bases right greater than left.  Electronically Signed   By: Oneil Devonshire M.D.   On: 10/31/2023 03:27

## 2024-01-01 NOTE — Patient Instructions (Signed)
 [facility] [place] - A DEPT OF [facility]  Discharge Instructions: Thank you for choosing [facility] to provide your oncology and hematology care.  If you have a lab appointment with the Cancer Center - please note that after April 8th, 2024, all labs will be drawn in the cancer center.  You do not have to check in or register with the main entrance as you have in the past but will complete your check-in in the cancer center.  Wear comfortable clothing and clothing appropriate for easy access to any Portacath or PICC line.   We strive to give you quality time with your provider. You may need to reschedule your appointment if you arrive late (15 or more minutes).  Arriving late affects you and other patients whose appointments are after yours.  Also, if you miss three or more appointments without notifying the office, you may be dismissed from the clinic at the provider's discretion.      For prescription refill requests, have your pharmacy contact our office and allow 72 hours for refills to be completed.    Today you received the following chemotherapy and/or immunotherapy agents Kyprolis /Sacrclisa/ 1Liter of NS over 1 hour      To help prevent nausea and vomiting after your treatment, we encourage you to take your nausea medication as directed.  BELOW ARE SYMPTOMS THAT SHOULD BE REPORTED IMMEDIATELY: *FEVER GREATER THAN 100.4 F (38 C) OR HIGHER *CHILLS OR SWEATING *NAUSEA AND VOMITING THAT IS NOT CONTROLLED WITH YOUR NAUSEA MEDICATION *UNUSUAL SHORTNESS OF BREATH *UNUSUAL BRUISING OR BLEEDING *URINARY PROBLEMS (pain or burning when urinating, or frequent urination) *BOWEL PROBLEMS (unusual diarrhea, constipation, pain near the anus) TENDERNESS IN MOUTH AND THROAT WITH OR WITHOUT PRESENCE OF ULCERS (sore throat, sores in mouth, or a toothache) UNUSUAL RASH, SWELLING OR PAIN  UNUSUAL VAGINAL DISCHARGE OR ITCHING   Items with * indicate a potential  emergency and should be followed up as soon as possible or go to the Emergency Department if any problems should occur.  Please show the CHEMOTHERAPY ALERT CARD or IMMUNOTHERAPY ALERT CARD at check-in to the Emergency Department and triage nurse.  Should you have questions after your visit or need to cancel or reschedule your appointment, please contact [facility] [place] - A DEPT OF [facility] [phone]  and follow the prompts.  Office hours are 8:00 a.m. to 4:30 p.m. Monday - Friday. Please note that voicemails left after 4:00 p.m. may not be returned until the following business day.  We are closed weekends and major holidays. You have access to a nurse at all times for urgent questions. Please call the main number to the clinic [phone] and follow the prompts.  For any non-urgent questions, you may also contact your provider using MyChart. We now offer e-Visits for anyone 2 and older to request care online for non-urgent symptoms. For details visit [email or web address].   Also download the MyChart app! Go to the app store, search MyChart, open the app, select [facility], and log in with your MyChart username and password.

## 2024-01-02 ENCOUNTER — Other Ambulatory Visit: Payer: Self-pay

## 2024-01-08 ENCOUNTER — Inpatient Hospital Stay

## 2024-01-08 VITALS — BP 123/91 | HR 78 | Temp 97.8°F | Resp 19

## 2024-01-08 DIAGNOSIS — C9000 Multiple myeloma not having achieved remission: Secondary | ICD-10-CM

## 2024-01-08 DIAGNOSIS — Z5112 Encounter for antineoplastic immunotherapy: Secondary | ICD-10-CM | POA: Diagnosis not present

## 2024-01-08 LAB — COMPREHENSIVE METABOLIC PANEL WITH GFR
ALT: <5 U/L (ref 0–44)
AST: <10 U/L — ABNORMAL LOW (ref 15–41)
Albumin: 3.1 g/dL — ABNORMAL LOW (ref 3.5–5.0)
Alkaline Phosphatase: 50 U/L (ref 38–126)
Anion gap: 9 (ref 5–15)
BUN: 19 mg/dL (ref 6–20)
CO2: 27 mmol/L (ref 22–32)
Calcium: 8.8 mg/dL — ABNORMAL LOW (ref 8.9–10.3)
Chloride: 102 mmol/L (ref 98–111)
Creatinine, Ser: 1.3 mg/dL — ABNORMAL HIGH (ref 0.61–1.24)
GFR, Estimated: >60 mL/min (ref >60)
Glucose, Bld: 122 mg/dL — ABNORMAL HIGH (ref 70–99)
Potassium: 3.5 mmol/L (ref 3.5–5.1)
Sodium: 139 mmol/L (ref 135–145)
Total Bilirubin: 0.3 mg/dL (ref 0.0–1.2)
Total Protein: 5.3 g/dL — ABNORMAL LOW (ref 6.5–8.1)

## 2024-01-08 LAB — MAGNESIUM: Magnesium: 1.7 mg/dL (ref 1.7–2.4)

## 2024-01-08 LAB — CBC WITH DIFFERENTIAL/PLATELET
Abs Immature Granulocytes: 0.02 K/uL (ref 0.00–0.07)
Basophils Absolute: 0 K/uL (ref 0.0–0.1)
Basophils Relative: 0 %
Eosinophils Absolute: 0.1 K/uL (ref 0.0–0.5)
Eosinophils Relative: 4 %
HCT: 32.1 % — ABNORMAL LOW (ref 39.0–52.0)
Hemoglobin: 10.3 g/dL — ABNORMAL LOW (ref 13.0–17.0)
Immature Granulocytes: 1 %
Lymphocytes Relative: 24 %
Lymphs Abs: 0.6 K/uL — ABNORMAL LOW (ref 0.7–4.0)
MCH: 29.3 pg (ref 26.0–34.0)
MCHC: 32.1 g/dL (ref 30.0–36.0)
MCV: 91.2 fL (ref 80.0–100.0)
Monocytes Absolute: 0.6 K/uL (ref 0.1–1.0)
Monocytes Relative: 24 %
Neutro Abs: 1.2 K/uL — ABNORMAL LOW (ref 1.7–7.7)
Neutrophils Relative %: 47 %
Platelets: 158 K/uL (ref 150–400)
RBC: 3.52 MIL/uL — ABNORMAL LOW (ref 4.22–5.81)
RDW: 19 % — ABNORMAL HIGH (ref 11.5–15.5)
WBC: 2.5 K/uL — ABNORMAL LOW (ref 4.0–10.5)
nRBC: 0 % (ref 0.0–0.2)

## 2024-01-08 MED ORDER — SODIUM CHLORIDE 0.9 % IV SOLN: Freq: Once | INTRAVENOUS | Status: AC

## 2024-01-08 MED ORDER — HYDROMORPHONE HCL 1 MG/ML IJ SOLN
2.0000 mg | Freq: Once | INTRAMUSCULAR | Status: AC
  Administered 2024-01-08: 2 mg via INTRAVENOUS
  Filled 2024-01-08: qty 2

## 2024-01-08 MED ORDER — DEXTROSE 5 % IV SOLN
56.0000 mg/m2 | Freq: Once | INTRAVENOUS | Status: AC
  Administered 2024-01-08: 120 mg via INTRAVENOUS
  Filled 2024-01-08: qty 60

## 2024-01-08 MED ORDER — PALONOSETRON HCL INJECTION 0.25 MG/5ML
0.2500 mg | Freq: Once | INTRAVENOUS | Status: AC
  Administered 2024-01-08: 0.25 mg via INTRAVENOUS
  Filled 2024-01-08: qty 5

## 2024-01-08 MED ORDER — SODIUM CHLORIDE 0.9 % IV SOLN
20.0000 mg | Freq: Once | INTRAVENOUS | Status: AC
  Administered 2024-01-08: 20 mg via INTRAVENOUS
  Filled 2024-01-08: qty 2

## 2024-01-08 MED ORDER — SODIUM CHLORIDE 0.9 % IV SOLN: INTRAVENOUS | Status: AC

## 2024-01-08 MED ORDER — OXYCODONE HCL 5 MG PO TABS
5.0000 mg | ORAL_TABLET | Freq: Once | ORAL | Status: AC
  Administered 2024-01-08: 5 mg via ORAL
  Filled 2024-01-08: qty 1

## 2024-01-08 NOTE — Progress Notes (Unsigned)
 Patient presents today for chemotherapy Kyprolis  infusion.  Patient is in satisfactory condition with no new complaints voiced.  Vital signs are stable.  Labs reviewed and all other labs are within treatment parameters. Patient's ANC noted to be 1.2 today. Dr.Kandala made aware. Patient received Dilaudid  2mg  IV while at the clinic and Oxycodone  5 mg prior to discharge due to left leg pain on out of a  8-10 pain scale per Dr.Kandala   We will proceed with treatment per MD orders.    Treatment given today per MD orders. Tolerated infusion without adverse affects. Vital signs stable. No complaints at this time. Discharged from clinic via stretcher in stable condition. Alert and oriented x 3. F/U with [facility] as scheduled.

## 2024-01-13 ENCOUNTER — Other Ambulatory Visit: Payer: Self-pay

## 2024-01-15 ENCOUNTER — Inpatient Hospital Stay

## 2024-01-15 ENCOUNTER — Other Ambulatory Visit: Payer: Self-pay | Admitting: *Deleted

## 2024-01-15 VITALS — BP 106/70 | HR 81 | Temp 96.9°F | Resp 18

## 2024-01-15 DIAGNOSIS — C9000 Multiple myeloma not having achieved remission: Secondary | ICD-10-CM

## 2024-01-15 DIAGNOSIS — R200 Anesthesia of skin: Secondary | ICD-10-CM

## 2024-01-15 DIAGNOSIS — Z5112 Encounter for antineoplastic immunotherapy: Secondary | ICD-10-CM | POA: Diagnosis not present

## 2024-01-15 LAB — COMPREHENSIVE METABOLIC PANEL WITH GFR
ALT: <5 U/L (ref 0–44)
AST: <10 U/L — ABNORMAL LOW (ref 15–41)
Albumin: 3.2 g/dL — ABNORMAL LOW (ref 3.5–5.0)
Alkaline Phosphatase: 50 U/L (ref 38–126)
Anion gap: 11 (ref 5–15)
BUN: 20 mg/dL (ref 6–20)
CO2: 25 mmol/L (ref 22–32)
Calcium: 8.5 mg/dL — ABNORMAL LOW (ref 8.9–10.3)
Chloride: 103 mmol/L (ref 98–111)
Creatinine, Ser: 1.1 mg/dL (ref 0.61–1.24)
GFR, Estimated: >60 mL/min (ref >60)
Glucose, Bld: 186 mg/dL — ABNORMAL HIGH (ref 70–99)
Potassium: 3.3 mmol/L — ABNORMAL LOW (ref 3.5–5.1)
Sodium: 139 mmol/L (ref 135–145)
Total Bilirubin: 0.3 mg/dL (ref 0.0–1.2)
Total Protein: 5.1 g/dL — ABNORMAL LOW (ref 6.5–8.1)

## 2024-01-15 LAB — CBC WITH DIFFERENTIAL/PLATELET
Abs Immature Granulocytes: 0.03 K/uL (ref 0.00–0.07)
Basophils Absolute: 0 K/uL (ref 0.0–0.1)
Basophils Relative: 1 %
Eosinophils Absolute: 0.1 K/uL (ref 0.0–0.5)
Eosinophils Relative: 1 %
HCT: 31.9 % — ABNORMAL LOW (ref 39.0–52.0)
Hemoglobin: 10.3 g/dL — ABNORMAL LOW (ref 13.0–17.0)
Immature Granulocytes: 1 %
Lymphocytes Relative: 25 %
Lymphs Abs: 1.1 K/uL (ref 0.7–4.0)
MCH: 29.6 pg (ref 26.0–34.0)
MCHC: 32.3 g/dL (ref 30.0–36.0)
MCV: 91.7 fL (ref 80.0–100.0)
Monocytes Absolute: 1 K/uL (ref 0.1–1.0)
Monocytes Relative: 22 %
Neutro Abs: 2.1 K/uL (ref 1.7–7.7)
Neutrophils Relative %: 50 %
Platelets: 224 K/uL (ref 150–400)
RBC: 3.48 MIL/uL — ABNORMAL LOW (ref 4.22–5.81)
RDW: 19.3 % — ABNORMAL HIGH (ref 11.5–15.5)
WBC: 4.2 K/uL (ref 4.0–10.5)
nRBC: 0 % (ref 0.0–0.2)

## 2024-01-15 LAB — MAGNESIUM: Magnesium: 1.9 mg/dL (ref 1.7–2.4)

## 2024-01-15 MED ORDER — SODIUM CHLORIDE 0.9 % IV SOLN
10.0000 mg/kg | Freq: Once | INTRAVENOUS | Status: AC
  Administered 2024-01-15: 1000 mg via INTRAVENOUS
  Filled 2024-01-15: qty 50

## 2024-01-15 MED ORDER — POTASSIUM CHLORIDE CRYS ER 20 MEQ PO TBCR
40.0000 meq | EXTENDED_RELEASE_TABLET | Freq: Once | ORAL | Status: AC
  Administered 2024-01-15: 40 meq via ORAL
  Filled 2024-01-15: qty 2

## 2024-01-15 MED ORDER — OXYCODONE HCL 5 MG PO TABS: 10.0000 mg | ORAL_TABLET | Freq: Once | ORAL | Status: DC

## 2024-01-15 MED ORDER — SODIUM CHLORIDE 0.9 % IV SOLN: Freq: Once | INTRAVENOUS | Status: AC

## 2024-01-15 MED ORDER — LIDOCAINE-PRILOCAINE 2.5-2.5 % EX CREA: 1.0000 | TOPICAL_CREAM | CUTANEOUS | 6 refills | Status: AC | PRN

## 2024-01-15 MED ORDER — SODIUM CHLORIDE 0.9 % IV SOLN: INTRAVENOUS | Status: DC

## 2024-01-15 MED ORDER — ACETAMINOPHEN 325 MG PO TABS
650.0000 mg | ORAL_TABLET | Freq: Once | ORAL | Status: AC
  Administered 2024-01-15: 650 mg via ORAL
  Filled 2024-01-15: qty 2

## 2024-01-15 MED ORDER — HYDROMORPHONE HCL 1 MG/ML IJ SOLN
2.0000 mg | Freq: Once | INTRAMUSCULAR | Status: AC
  Administered 2024-01-15: 2 mg via INTRAVENOUS
  Filled 2024-01-15: qty 2

## 2024-01-15 MED ORDER — DIPHENHYDRAMINE HCL 50 MG/ML IJ SOLN
50.0000 mg | Freq: Once | INTRAMUSCULAR | Status: AC
  Administered 2024-01-15: 50 mg via INTRAVENOUS
  Filled 2024-01-15: qty 1

## 2024-01-15 MED ORDER — PALONOSETRON HCL INJECTION 0.25 MG/5ML
0.2500 mg | Freq: Once | INTRAVENOUS | Status: AC
  Administered 2024-01-15: 0.25 mg via INTRAVENOUS
  Filled 2024-01-15: qty 5

## 2024-01-15 MED ORDER — SODIUM CHLORIDE 0.9 % IV SOLN
20.0000 mg | Freq: Once | INTRAVENOUS | Status: AC
  Administered 2024-01-15: 20 mg via INTRAVENOUS
  Filled 2024-01-15: qty 20

## 2024-01-15 MED ORDER — FAMOTIDINE IN NACL 20-0.9 MG/50ML-% IV SOLN
20.0000 mg | Freq: Once | INTRAVENOUS | Status: AC
  Administered 2024-01-15: 20 mg via INTRAVENOUS
  Filled 2024-01-15: qty 50

## 2024-01-15 MED ORDER — DEXTROSE 5 % IV SOLN
56.0000 mg/m2 | Freq: Once | INTRAVENOUS | Status: AC
  Administered 2024-01-15: 120 mg via INTRAVENOUS
  Filled 2024-01-15: qty 60

## 2024-01-15 NOTE — Progress Notes (Signed)
 Patient unable to have weight updated- arrives via stretcher and departs on stretcher.  Niels Molt, PharmD

## 2024-01-15 NOTE — Patient Instructions (Signed)
 [facility] [place] - A DEPT OF [place]  Discharge Instructions: Thank you for choosing [facility] to provide your oncology and hematology care.  If you have a lab appointment with the Cancer Center - please note that after April 8th, 2024, all labs will be drawn in the cancer center.  You do not have to check in or register with the main entrance as you have in the past but will complete your check-in in the cancer center.  Wear comfortable clothing and clothing appropriate for easy access to any Portacath or PICC line.   We strive to give you quality time with your provider. You may need to reschedule your appointment if you arrive late (15 or more minutes).  Arriving late affects you and other patients whose appointments are after yours.  Also, if you miss three or more appointments without notifying the office, you may be dismissed from the clinic at the provider's discretion.      For prescription refill requests, have your pharmacy contact our office and allow 72 hours for refills to be completed.    Today you received the following chemotherapy and/or immunotherapy agents Kyprolis /sarclisa        To help prevent nausea and vomiting after your treatment, we encourage you to take your nausea medication as directed.  BELOW ARE SYMPTOMS THAT SHOULD BE REPORTED IMMEDIATELY: *FEVER GREATER THAN 100.4 F (38 C) OR HIGHER *CHILLS OR SWEATING *NAUSEA AND VOMITING THAT IS NOT CONTROLLED WITH YOUR NAUSEA MEDICATION *UNUSUAL SHORTNESS OF BREATH *UNUSUAL BRUISING OR BLEEDING *URINARY PROBLEMS (pain or burning when urinating, or frequent urination) *BOWEL PROBLEMS (unusual diarrhea, constipation, pain near the anus) TENDERNESS IN MOUTH AND THROAT WITH OR WITHOUT PRESENCE OF ULCERS (sore throat, sores in mouth, or a toothache) UNUSUAL RASH, SWELLING OR PAIN  UNUSUAL VAGINAL DISCHARGE OR ITCHING   Items with * indicate a potential emergency and should be  followed up as soon as possible or go to the Emergency Department if any problems should occur.  Please show the CHEMOTHERAPY ALERT CARD or IMMUNOTHERAPY ALERT CARD at check-in to the Emergency Department and triage nurse.  Should you have questions after your visit or need to cancel or reschedule your appointment, please contact [facility] [place] - A DEPT OF [facility] [phone]  and follow the prompts.  Office hours are 8:00 a.m. to 4:30 p.m. Monday - Friday. Please note that voicemails left after 4:00 p.m. may not be returned until the following business day.  We are closed weekends and major holidays. You have access to a nurse at all times for urgent questions. Please call the main number to the clinic [phone] and follow the prompts.  For any non-urgent questions, you may also contact your provider using MyChart. We now offer e-Visits for anyone 93 and older to request care online for non-urgent symptoms. For details visit [email or web address].   Also download the MyChart app! Go to the app store, search MyChart, open the app, select [facility], and log in with your MyChart username and password.

## 2024-01-15 NOTE — Progress Notes (Signed)
 Patient presents today for Kyprolis /Isatuximab  infusions per providers order.  Vital signs and labs within parameters for treatment.  Patient has no new complaints at this time.  Treatment given today per MD orders.  Stable during infusion without adverse affects.  Vital signs stable.  No complaints at this time.  Discharge from clinic via transport in stable condition.  Alert and oriented X 3.  Follow up with [facility] as scheduled.

## 2024-01-16 LAB — KAPPA/LAMBDA LIGHT CHAINS
Kappa free light chain: 22.7 mg/L — ABNORMAL HIGH (ref 3.3–19.4)
Kappa, lambda light chain ratio: 1.35 (ref 0.26–1.65)
Lambda free light chains: 16.8 mg/L (ref 5.7–26.3)

## 2024-01-19 ENCOUNTER — Encounter: Payer: Self-pay | Admitting: Radiology

## 2024-01-19 LAB — MULTIPLE MYELOMA PANEL, SERUM
Albumin SerPl Elph-Mcnc: 2.5 g/dL — ABNORMAL LOW (ref 2.9–4.4)
Albumin/Glob SerPl: 1.2 (ref 0.7–1.7)
Alpha 1: 0.2 g/dL (ref 0.0–0.4)
Alpha2 Glob SerPl Elph-Mcnc: 0.7 g/dL (ref 0.4–1.0)
B-Globulin SerPl Elph-Mcnc: 0.7 g/dL (ref 0.7–1.3)
Gamma Glob SerPl Elph-Mcnc: 0.6 g/dL (ref 0.4–1.8)
Globulin, Total: 2.2 g/dL (ref 2.2–3.9)
IgA: 88 mg/dL — ABNORMAL LOW (ref 90–386)
IgG (Immunoglobin G), Serum: 745 mg/dL (ref 603–1613)
IgM (Immunoglobulin M), Srm: 11 mg/dL — ABNORMAL LOW (ref 20–172)
M Protein SerPl Elph-Mcnc: 0.2 g/dL — ABNORMAL HIGH
Total Protein ELP: 4.7 g/dL — ABNORMAL LOW (ref 6.0–8.5)

## 2024-01-23 ENCOUNTER — Other Ambulatory Visit: Payer: Self-pay | Admitting: Oncology

## 2024-01-23 DIAGNOSIS — C9000 Multiple myeloma not having achieved remission: Secondary | ICD-10-CM

## 2024-01-28 ENCOUNTER — Other Ambulatory Visit: Payer: Self-pay

## 2024-01-28 DIAGNOSIS — C9000 Multiple myeloma not having achieved remission: Secondary | ICD-10-CM

## 2024-01-28 MED ORDER — LENALIDOMIDE 15 MG PO CAPS: 15.0000 mg | ORAL_CAPSULE | Freq: Every day | ORAL | 0 refills | Status: DC

## 2024-01-28 NOTE — Telephone Encounter (Signed)
 Chart reviewed. Revlimid  refilled per last office note with Dr. Davonna.

## 2024-01-29 ENCOUNTER — Inpatient Hospital Stay: Attending: Oncology

## 2024-01-29 ENCOUNTER — Inpatient Hospital Stay

## 2024-01-29 ENCOUNTER — Encounter (HOSPITAL_COMMUNITY): Payer: Self-pay | Admitting: Oncology

## 2024-01-29 VITALS — BP 103/77 | HR 88 | Temp 97.7°F | Resp 18

## 2024-01-29 DIAGNOSIS — C9000 Multiple myeloma not having achieved remission: Secondary | ICD-10-CM

## 2024-01-29 DIAGNOSIS — Z5112 Encounter for antineoplastic immunotherapy: Secondary | ICD-10-CM | POA: Diagnosis present

## 2024-01-29 DIAGNOSIS — Z79899 Other long term (current) drug therapy: Secondary | ICD-10-CM | POA: Diagnosis not present

## 2024-01-29 DIAGNOSIS — E876 Hypokalemia: Secondary | ICD-10-CM

## 2024-01-29 LAB — COMPREHENSIVE METABOLIC PANEL WITH GFR
ALT: 7 U/L (ref 0–44)
AST: <10 U/L — ABNORMAL LOW (ref 15–41)
Albumin: 3.3 g/dL — ABNORMAL LOW (ref 3.5–5.0)
Alkaline Phosphatase: 54 U/L (ref 38–126)
Anion gap: 12 (ref 5–15)
BUN: 19 mg/dL (ref 6–20)
CO2: 25 mmol/L (ref 22–32)
Calcium: 8.6 mg/dL — ABNORMAL LOW (ref 8.9–10.3)
Chloride: 98 mmol/L (ref 98–111)
Creatinine, Ser: 1.17 mg/dL (ref 0.61–1.24)
GFR, Estimated: >60 mL/min (ref >60)
Glucose, Bld: 163 mg/dL — ABNORMAL HIGH (ref 70–99)
Potassium: 3.1 mmol/L — ABNORMAL LOW (ref 3.5–5.1)
Sodium: 135 mmol/L (ref 135–145)
Total Bilirubin: 0.7 mg/dL (ref 0.0–1.2)
Total Protein: 5.7 g/dL — ABNORMAL LOW (ref 6.5–8.1)

## 2024-01-29 LAB — CBC WITH DIFFERENTIAL/PLATELET
Abs Immature Granulocytes: 0.02 K/uL (ref 0.00–0.07)
Basophils Absolute: 0 K/uL (ref 0.0–0.1)
Basophils Relative: 1 %
Eosinophils Absolute: 0.1 K/uL (ref 0.0–0.5)
Eosinophils Relative: 3 %
HCT: 32.3 % — ABNORMAL LOW (ref 39.0–52.0)
Hemoglobin: 10.9 g/dL — ABNORMAL LOW (ref 13.0–17.0)
Immature Granulocytes: 1 %
Lymphocytes Relative: 25 %
Lymphs Abs: 0.8 K/uL (ref 0.7–4.0)
MCH: 31.1 pg (ref 26.0–34.0)
MCHC: 33.7 g/dL (ref 30.0–36.0)
MCV: 92.3 fL (ref 80.0–100.0)
Monocytes Absolute: 0.4 K/uL (ref 0.1–1.0)
Monocytes Relative: 12 %
Neutro Abs: 2 K/uL (ref 1.7–7.7)
Neutrophils Relative %: 58 %
Platelets: 140 K/uL — ABNORMAL LOW (ref 150–400)
RBC: 3.5 MIL/uL — ABNORMAL LOW (ref 4.22–5.81)
RDW: 18.1 % — ABNORMAL HIGH (ref 11.5–15.5)
WBC: 3.4 K/uL — ABNORMAL LOW (ref 4.0–10.5)
nRBC: 0 % (ref 0.0–0.2)

## 2024-01-29 LAB — MAGNESIUM: Magnesium: 1.8 mg/dL (ref 1.7–2.4)

## 2024-01-29 MED ORDER — DEXTROSE 5 % IV SOLN
56.0000 mg/m2 | Freq: Once | INTRAVENOUS | Status: AC
  Administered 2024-01-29: 120 mg via INTRAVENOUS
  Filled 2024-01-29: qty 60

## 2024-01-29 MED ORDER — FAMOTIDINE IN NACL 20-0.9 MG/50ML-% IV SOLN
20.0000 mg | Freq: Once | INTRAVENOUS | Status: AC
  Administered 2024-01-29: 20 mg via INTRAVENOUS
  Filled 2024-01-29: qty 50

## 2024-01-29 MED ORDER — HYDROMORPHONE HCL 1 MG/ML IJ SOLN
2.0000 mg | Freq: Once | INTRAMUSCULAR | Status: AC
  Administered 2024-01-29: 2 mg via INTRAVENOUS
  Filled 2024-01-29: qty 2

## 2024-01-29 MED ORDER — SODIUM CHLORIDE 0.9 % IV SOLN
20.0000 mg | Freq: Once | INTRAVENOUS | Status: AC
  Administered 2024-01-29: 20 mg via INTRAVENOUS
  Filled 2024-01-29: qty 20

## 2024-01-29 MED ORDER — SODIUM CHLORIDE 0.9 % IV SOLN: Freq: Once | INTRAVENOUS | Status: AC

## 2024-01-29 MED ORDER — POTASSIUM CHLORIDE CRYS ER 20 MEQ PO TBCR
40.0000 meq | EXTENDED_RELEASE_TABLET | Freq: Once | ORAL | Status: AC
  Administered 2024-01-29: 40 meq via ORAL
  Filled 2024-01-29: qty 2

## 2024-01-29 MED ORDER — DIPHENHYDRAMINE HCL 50 MG/ML IJ SOLN
50.0000 mg | Freq: Once | INTRAMUSCULAR | Status: AC
  Administered 2024-01-29: 50 mg via INTRAVENOUS
  Filled 2024-01-29: qty 1

## 2024-01-29 MED ORDER — PALONOSETRON HCL INJECTION 0.25 MG/5ML
0.2500 mg | Freq: Once | INTRAVENOUS | Status: AC
  Administered 2024-01-29: 0.25 mg via INTRAVENOUS
  Filled 2024-01-29: qty 5

## 2024-01-29 MED ORDER — SODIUM CHLORIDE 0.9 % IV SOLN
10.0000 mg/kg | Freq: Once | INTRAVENOUS | Status: AC
  Administered 2024-01-29: 1000 mg via INTRAVENOUS
  Filled 2024-01-29: qty 50

## 2024-01-29 MED ORDER — ACETAMINOPHEN 325 MG PO TABS
650.0000 mg | ORAL_TABLET | Freq: Once | ORAL | Status: AC
  Administered 2024-01-29: 650 mg via ORAL
  Filled 2024-01-29: qty 2

## 2024-01-29 MED ORDER — SODIUM CHLORIDE 0.9 % IV SOLN: INTRAVENOUS | Status: DC

## 2024-01-29 MED ORDER — OXYCODONE HCL 5 MG PO TABS
5.0000 mg | ORAL_TABLET | Freq: Once | ORAL | Status: AC
  Administered 2024-01-29: 5 mg via ORAL
  Filled 2024-01-29: qty 1

## 2024-01-29 NOTE — Progress Notes (Signed)
 Patient presents today for chemotherapy/immunotherapy infusion of Kyprolis  and Isatuximab .  Patient is in satisfactory condition with no new complaints voiced.  Vital signs are stable.  Labs reviewed and all labs are within treatment parameters. Patient's potassium 3.1, of poatssium to be given PO per order. We will proceed with treatment per MD orders.    Patient given Roxicodone  5mg  for pain in right leg per Dr Armanda orders. Patient tolerated well.  Patient tolerated treatment well with no complaints voiced.  Patient left via transport team in stable condition.  Vital signs stable at discharge.  Follow up as scheduled.

## 2024-01-29 NOTE — Patient Instructions (Signed)
 [facility] [place] - A DEPT OF [place]  Discharge Instructions: Thank you for choosing [facility] to provide your oncology and hematology care.  If you have a lab appointment with the Cancer Center - please note that after April 8th, 2024, all labs will be drawn in the cancer center.  You do not have to check in or register with the main entrance as you have in the past but will complete your check-in in the cancer center.  Wear comfortable clothing and clothing appropriate for easy access to any Portacath or PICC line.   We strive to give you quality time with your provider. You may need to reschedule your appointment if you arrive late (15 or more minutes).  Arriving late affects you and other patients whose appointments are after yours.  Also, if you miss three or more appointments without notifying the office, you may be dismissed from the clinic at the provider's discretion.      For prescription refill requests, have your pharmacy contact our office and allow 72 hours for refills to be completed.    Today you received the following chemotherapy and/or immunotherapy agents Kyprolis  and Isatuximab .     Carfilzomib  Injection What is this medication? CARFILZOMIB  (kar FILZ oh mib) treats multiple myeloma, a type of bone marrow cancer. It works by blocking a protein that causes cancer cells to grow and multiply. This helps to slow or stop the spread of cancer cells. This medicine may be used for other purposes; ask your health care provider or pharmacist if you have questions. COMMON BRAND NAME(S): KYPROLIS  What should I tell my care team before I take this medication? They need to know if you have any of these conditions: Heart disease History of blood clots Irregular heartbeat Kidney disease Liver disease Lung or breathing disease An unusual or allergic reaction to carfilzomib , or other medications, foods, dyes, or preservatives If you or your  partner are pregnant or trying to get pregnant Breastfeeding How should I use this medication? This medication is injected into a vein. It is given by your care team in a hospital or clinic setting. Talk to your care team about the use of this medication in children. Special care may be needed. Overdosage: If you think you have taken too much of this medicine contact a poison control center or emergency room at once. NOTE: This medicine is only for you. Do not share this medicine with others. What if I miss a dose? Keep appointments for follow-up doses. It is important not to miss your dose. Call your care team if you are unable to keep an appointment. What may interact with this medication? Interactions are not expected. This list may not describe all possible interactions. Give your health care provider a list of all the medicines, herbs, non-prescription drugs, or dietary supplements you use. Also tell them if you smoke, drink alcohol , or use illegal drugs. Some items may interact with your medicine. What should I watch for while using this medication? Your condition will be monitored carefully while you are receiving this medication. You may need blood work while taking this medication. Check with your care team if you have severe diarrhea, nausea, and vomiting, or if you sweat a lot. The loss of too much body fluid may make it dangerous for you to take this medication. This medication may affect your coordination, reaction time, or judgment. Do not drive or operate machinery until you know how  this medication affects you. Sit up or stand slowly to reduce the risk of dizzy or fainting spells. Drinking alcohol  with this medication can increase the risk of these side effects. Talk to your care team if you may be pregnant. Serious birth defects can occur if you take this medication during pregnancy and for 6 months after the last dose. You will need a negative pregnancy test before starting this  medication. Contraception is recommended while taking this medication and for 6 months after the last dose. Your care team can help you find an option that works for you. If your partner can get pregnant, use a condom during sex while taking this medication and for 3 months after the last dose. Do not breastfeed while taking this medication and for 2 weeks after the last dose. This medication may cause infertility. Talk to your care team if you are concerned about your fertility. What side effects may I notice from receiving this medication? Side effects that you should report to your care team as soon as possible: Allergic reactions--skin rash, itching, hives, swelling of the face, lips, tongue, or throat Bleeding--bloody or black, tar-like stools, vomiting blood or brown material that looks like coffee grounds, red or dark brown urine, small red or purple spots on skin, unusual bruising or bleeding Blood clot--pain, swelling, or warmth in the leg, shortness of breath, chest pain Dizziness, loss of balance or coordination, confusion or trouble speaking Heart attack--pain or tightness in the chest, shoulders, arms, or jaw, nausea, shortness of breath, cold or clammy skin, feeling faint or lightheaded Heart failure--shortness of breath, swelling of the ankles, feet, or hands, sudden weight gain, unusual weakness or fatigue Heart rhythm changes--fast or irregular heartbeat, dizziness, feeling faint or lightheaded, chest pain, trouble breathing Increase in blood pressure Infection--fever, chills, cough, sore throat, wounds that don't heal, pain or trouble when passing urine, general feeling of discomfort or being unwell Infusion reactions--chest pain, shortness of breath or trouble breathing, feeling faint or lightheaded Kidney injury--decrease in the amount of urine, swelling of the ankles, hands, or feet Liver injury--right upper belly pain, loss of appetite, nausea, light-colored stool, dark yellow  or brown urine, yellowing skin or eyes, unusual weakness or fatigue Lung injury--shortness of breath or trouble breathing, cough, spitting up blood, chest pain, fever Pulmonary hypertension--shortness of breath, chest pain, fast or irregular heartbeat, feeling faint or lightheaded, fatigue, swelling of the ankles or feet Stomach pain, bloody diarrhea, pale skin, unusual weakness or fatigue, decrease in the amount of urine, which may be signs of hemolytic uremic syndrome Sudden and severe headache, confusion, change in vision, seizures, which may be signs of posterior reversible encephalopathy syndrome (PRES) TTP--purple spots on the skin or inside the mouth, pale skin, yellowing skin or eyes, unusual weakness or fatigue, fever, fast or irregular heartbeat, confusion, change in vision, trouble speaking, trouble walking Tumor lysis syndrome (TLS)--nausea, vomiting, diarrhea, decrease in the amount of urine, dark urine, unusual weakness or fatigue, confusion, muscle pain or cramps, fast or irregular heartbeat, joint pain Side effects that usually do not require medical attention (report to your care team if they continue or are bothersome): Diarrhea Fatigue Nausea Trouble sleeping This list may not describe all possible side effects. Call your doctor for medical advice about side effects. You may report side effects to FDA at 1-800-FDA-1088. Where should I keep my medication? This medication is given in a hospital or clinic. It will not be stored at home. NOTE: This sheet is a summary.  It may not cover all possible information. If you have questions about this medicine, talk to your doctor, pharmacist, or health care provider.  2024 Elsevier/Gold Standard (2021-08-02 00:00:00)  Isatuximab  Injection What is this medication? ISATUXIMAB  (EYE sa TUX i mab) treats a type of bone marrow cancer (multiple myeloma). It works by blocking a protein that causes cancer cells to grow and multiply. This helps to  slow or stop the spread of cancer cells. This medicine may be used for other purposes; ask your health care provider or pharmacist if you have questions. COMMON BRAND NAME(S): SARCLISA  What should I tell my care team before I take this medication? They need to know if you have any of these conditions: Heart disease Infection, especially a viral infection, such as chickenpox, cold sores, herpes An unusual or allergic reaction to isatuximab , other medications, foods, dyes, or preservatives Pregnant or trying to get pregnant Breast-feeding How should I use this medication? This medication is injected into a vein. It is given by your care team in a hospital or clinic setting. Talk to your care team about the use of this medication in children. Special care may be needed. Overdosage: If you think you have taken too much of this medicine contact a poison control center or emergency room at once. NOTE: This medicine is only for you. Do not share this medicine with others. What if I miss a dose? Keep appointments for follow-up doses. It is important not to miss your dose. Call your care team if you are unable to keep an appointment. What may interact with this medication? Interactions have not been studied. This list may not describe all possible interactions. Give your health care provider a list of all the medicines, herbs, non-prescription drugs, or dietary supplements you use. Also tell them if you smoke, drink alcohol , or use illegal drugs. Some items may interact with your medicine. What should I watch for while using this medication? Your condition will be monitored carefully while you are receiving this medication. You may need blood work done while you are taking this medication. This medication may increase your risk of getting an infection. Call your care team for advice if you get a fever, chills, sore throat, or other symptoms of a cold or flu. Do not treat yourself. Try to avoid being  around people who are sick. If you have not had the measles or chickenpox vaccines, tell your care team right away if you are around someone with these viruses. Talk to your care team about your risk of cancer. You may be more at risk for certain types of cancers if you take this medication. This medication can affect the results of blood tests to match your blood type. Your care team will do blood tests to match your blood type before you start treatment. Tell your care team that you are being treated with this medication before receiving a blood transfusion. Do not become pregnant while taking this medicine or for at least 5 months after stopping it. Inform your doctor if you wish to become pregnant or think you might be pregnant. There is a potential for serious side effects to an unborn child. Talk to your care team for more information. Do not breast-feed while taking this medicine. What side effects may I notice from receiving this medication? Side effects that you should report to your care team as soon as possible: Allergic reactions--skin rash, itching, hives, swelling of the face, lips, tongue, or throat Heart failure--shortness of breath,  swelling of the ankles, feet, or hands, sudden weight gain, unusual weakness or fatigue Infection--fever, chills, cough, or sore throat Infusion reactions--chest pain, shortness of breath or trouble breathing, feeling faint or lightheaded Side effects that usually do not require medical attention (report these to your care team if they continue or are bothersome): Back pain Diarrhea Increase in blood pressure Trouble sleeping Vomiting This list may not describe all possible side effects. Call your doctor for medical advice about side effects. You may report side effects to FDA at 1-800-FDA-1088. Where should I keep my medication? This medication is given in a hospital or clinic. It will not be stored at home. NOTE: This sheet is a summary. It may not  cover all possible information. If you have questions about this medicine, talk to your doctor, pharmacist, or health care provider.  2024 Elsevier/Gold Standard (2021-02-02 00:00:00)   To help prevent nausea and vomiting after your treatment, we encourage you to take your nausea medication as directed.  BELOW ARE SYMPTOMS THAT SHOULD BE REPORTED IMMEDIATELY: *FEVER GREATER THAN 100.4 F (38 C) OR HIGHER *CHILLS OR SWEATING *NAUSEA AND VOMITING THAT IS NOT CONTROLLED WITH YOUR NAUSEA MEDICATION *UNUSUAL SHORTNESS OF BREATH *UNUSUAL BRUISING OR BLEEDING *URINARY PROBLEMS (pain or burning when urinating, or frequent urination) *BOWEL PROBLEMS (unusual diarrhea, constipation, pain near the anus) TENDERNESS IN MOUTH AND THROAT WITH OR WITHOUT PRESENCE OF ULCERS (sore throat, sores in mouth, or a toothache) UNUSUAL RASH, SWELLING OR PAIN  UNUSUAL VAGINAL DISCHARGE OR ITCHING   Items with * indicate a potential emergency and should be followed up as soon as possible or go to the Emergency Department if any problems should occur.  Please show the CHEMOTHERAPY ALERT CARD or IMMUNOTHERAPY ALERT CARD at check-in to the Emergency Department and triage nurse.  Should you have questions after your visit or need to cancel or reschedule your appointment, please contact [facility] [place] - A DEPT OF [facility] [phone]  and follow the prompts.  Office hours are 8:00 a.m. to 4:30 p.m. Monday - Friday. Please note that voicemails left after 4:00 p.m. may not be returned until the following business day.  We are closed weekends and major holidays. You have access to a nurse at all times for urgent questions. Please call the main number to the clinic [phone] and follow the prompts.  For any non-urgent questions, you may also contact your provider using MyChart. We now offer e-Visits for anyone 66 and older to request care online for non-urgent symptoms. For details visit  [email or web address].   Also download the MyChart app! Go to the app store, search MyChart, open the app, select [facility], and log in with your MyChart username and password.

## 2024-01-29 NOTE — Progress Notes (Signed)
 Patients port flushed without difficulty.  Good blood return noted with no bruising or swelling noted at site. Patient remains accessed for treatment.

## 2024-02-05 ENCOUNTER — Inpatient Hospital Stay

## 2024-02-11 ENCOUNTER — Inpatient Hospital Stay

## 2024-02-11 DIAGNOSIS — Z5112 Encounter for antineoplastic immunotherapy: Secondary | ICD-10-CM | POA: Diagnosis not present

## 2024-02-11 DIAGNOSIS — C9000 Multiple myeloma not having achieved remission: Secondary | ICD-10-CM

## 2024-02-11 LAB — CBC WITH DIFFERENTIAL/PLATELET
Abs Immature Granulocytes: 0.1 K/uL — ABNORMAL HIGH (ref 0.00–0.07)
Basophils Absolute: 0 K/uL (ref 0.0–0.1)
Basophils Relative: 3 %
Blasts: 1 %
Eosinophils Absolute: 0 K/uL (ref 0.0–0.5)
Eosinophils Relative: 3 %
HCT: 28.8 % — ABNORMAL LOW (ref 39.0–52.0)
Hemoglobin: 9.5 g/dL — ABNORMAL LOW (ref 13.0–17.0)
Lymphocytes Relative: 51 %
Lymphs Abs: 0.7 K/uL (ref 0.7–4.0)
MCH: 30.7 pg (ref 26.0–34.0)
MCHC: 33 g/dL (ref 30.0–36.0)
MCV: 93.2 fL (ref 80.0–100.0)
Metamyelocytes Relative: 2 %
Monocytes Absolute: 0.1 K/uL (ref 0.1–1.0)
Monocytes Relative: 9 %
Myelocytes: 3 %
Neutro Abs: 0.4 K/uL — CL (ref 1.7–7.7)
Neutrophils Relative %: 28 %
Platelets: 230 K/uL (ref 150–400)
RBC: 3.09 MIL/uL — ABNORMAL LOW (ref 4.22–5.81)
RDW: 17.8 % — ABNORMAL HIGH (ref 11.5–15.5)
WBC: 1.3 K/uL — CL (ref 4.0–10.5)
nRBC: 0 % (ref 0.0–0.2)

## 2024-02-11 LAB — COMPREHENSIVE METABOLIC PANEL WITH GFR
ALT: <5 U/L (ref 0–44)
AST: <10 U/L — ABNORMAL LOW (ref 15–41)
Albumin: 2.9 g/dL — ABNORMAL LOW (ref 3.5–5.0)
Alkaline Phosphatase: 51 U/L (ref 38–126)
Anion gap: 10 (ref 5–15)
BUN: 16 mg/dL (ref 6–20)
CO2: 26 mmol/L (ref 22–32)
Calcium: 8.6 mg/dL — ABNORMAL LOW (ref 8.9–10.3)
Chloride: 102 mmol/L (ref 98–111)
Creatinine, Ser: 1.13 mg/dL (ref 0.61–1.24)
GFR, Estimated: >60 mL/min (ref >60)
Glucose, Bld: 138 mg/dL — ABNORMAL HIGH (ref 70–99)
Potassium: 3.3 mmol/L — ABNORMAL LOW (ref 3.5–5.1)
Sodium: 138 mmol/L (ref 135–145)
Total Bilirubin: 0.3 mg/dL (ref 0.0–1.2)
Total Protein: 5.3 g/dL — ABNORMAL LOW (ref 6.5–8.1)

## 2024-02-11 LAB — MAGNESIUM: Magnesium: 1.7 mg/dL (ref 1.7–2.4)

## 2024-02-11 MED ORDER — HYDROMORPHONE HCL 1 MG/ML IJ SOLN
2.0000 mg | Freq: Once | INTRAMUSCULAR | Status: AC
  Administered 2024-02-11: 2 mg via INTRAVENOUS
  Filled 2024-02-11: qty 2

## 2024-02-11 NOTE — Progress Notes (Unsigned)
 CRITICAL VALUE ALERT Critical value received:  WBC 1.3 ANC 0.4 Date of notification:  02-11-24 Time of notification: 1009 Critical value read back:  Yes.   Nurse who received alert:  C.Masson Nalepa RN MD notified time and response:  Dr. Davonna,  no treatment today per MD. Will reschedule in one week.

## 2024-02-11 NOTE — Progress Notes (Unsigned)
 Per Dr Davonna-    Add to each treatment plan:  Hydromorphone  2 mg IVpush x 1 to be given on arrival to infusion and Oxycodone  5 mg orally x 1 to be given just prior to EMT transportation.  Plan updated to reflect above.  Niels Molt, PharmD Clinical Pharmacist

## 2024-02-12 ENCOUNTER — Other Ambulatory Visit: Payer: Self-pay

## 2024-02-12 LAB — KAPPA/LAMBDA LIGHT CHAINS
Kappa free light chain: 32.1 mg/L — ABNORMAL HIGH (ref 3.3–19.4)
Kappa, lambda light chain ratio: 1.33 (ref 0.26–1.65)
Lambda free light chains: 24.1 mg/L (ref 5.7–26.3)

## 2024-02-15 LAB — MULTIPLE MYELOMA PANEL, SERUM
Albumin SerPl Elph-Mcnc: 2.4 g/dL — ABNORMAL LOW (ref 2.9–4.4)
Albumin/Glob SerPl: 1.1 (ref 0.7–1.7)
Alpha 1: 0.2 g/dL (ref 0.0–0.4)
Alpha2 Glob SerPl Elph-Mcnc: 0.9 g/dL (ref 0.4–1.0)
B-Globulin SerPl Elph-Mcnc: 0.7 g/dL (ref 0.7–1.3)
Gamma Glob SerPl Elph-Mcnc: 0.5 g/dL (ref 0.4–1.8)
Globulin, Total: 2.3 g/dL (ref 2.2–3.9)
IgA: 109 mg/dL (ref 90–386)
IgG (Immunoglobin G), Serum: 663 mg/dL (ref 603–1613)
IgM (Immunoglobulin M), Srm: 8 mg/dL — ABNORMAL LOW (ref 20–172)
M Protein SerPl Elph-Mcnc: 0.2 g/dL — ABNORMAL HIGH
Total Protein ELP: 4.7 g/dL — ABNORMAL LOW (ref 6.0–8.5)

## 2024-02-18 ENCOUNTER — Inpatient Hospital Stay: Attending: Oncology

## 2024-02-18 ENCOUNTER — Inpatient Hospital Stay

## 2024-02-18 VITALS — BP 104/74 | HR 72 | Temp 97.6°F | Resp 18

## 2024-02-18 DIAGNOSIS — Z887 Allergy status to serum and vaccine status: Secondary | ICD-10-CM | POA: Diagnosis not present

## 2024-02-18 DIAGNOSIS — Z79899 Other long term (current) drug therapy: Secondary | ICD-10-CM | POA: Insufficient documentation

## 2024-02-18 DIAGNOSIS — Z79624 Long term (current) use of inhibitors of nucleotide synthesis: Secondary | ICD-10-CM | POA: Insufficient documentation

## 2024-02-18 DIAGNOSIS — G8929 Other chronic pain: Secondary | ICD-10-CM | POA: Diagnosis not present

## 2024-02-18 DIAGNOSIS — Z79620 Long term (current) use of immunosuppressive biologic: Secondary | ICD-10-CM | POA: Insufficient documentation

## 2024-02-18 DIAGNOSIS — Z885 Allergy status to narcotic agent status: Secondary | ICD-10-CM | POA: Diagnosis not present

## 2024-02-18 DIAGNOSIS — Z7409 Other reduced mobility: Secondary | ICD-10-CM | POA: Diagnosis not present

## 2024-02-18 DIAGNOSIS — M79606 Pain in leg, unspecified: Secondary | ICD-10-CM | POA: Diagnosis not present

## 2024-02-18 DIAGNOSIS — J9811 Atelectasis: Secondary | ICD-10-CM | POA: Insufficient documentation

## 2024-02-18 DIAGNOSIS — Z7982 Long term (current) use of aspirin: Secondary | ICD-10-CM | POA: Insufficient documentation

## 2024-02-18 DIAGNOSIS — Z5112 Encounter for antineoplastic immunotherapy: Secondary | ICD-10-CM | POA: Diagnosis present

## 2024-02-18 DIAGNOSIS — E876 Hypokalemia: Secondary | ICD-10-CM | POA: Insufficient documentation

## 2024-02-18 DIAGNOSIS — C9000 Multiple myeloma not having achieved remission: Secondary | ICD-10-CM | POA: Diagnosis present

## 2024-02-18 DIAGNOSIS — N179 Acute kidney failure, unspecified: Secondary | ICD-10-CM | POA: Diagnosis not present

## 2024-02-18 DIAGNOSIS — Z888 Allergy status to other drugs, medicaments and biological substances status: Secondary | ICD-10-CM | POA: Diagnosis not present

## 2024-02-18 DIAGNOSIS — Z7961 Long term (current) use of immunomodulator: Secondary | ICD-10-CM | POA: Insufficient documentation

## 2024-02-18 DIAGNOSIS — D649 Anemia, unspecified: Secondary | ICD-10-CM | POA: Diagnosis not present

## 2024-02-18 LAB — CBC WITH DIFFERENTIAL/PLATELET
Abs Immature Granulocytes: 0.03 K/uL (ref 0.00–0.07)
Basophils Absolute: 0 K/uL (ref 0.0–0.1)
Basophils Relative: 0 %
Eosinophils Absolute: 0.1 K/uL (ref 0.0–0.5)
Eosinophils Relative: 2 %
HCT: 29 % — ABNORMAL LOW (ref 39.0–52.0)
Hemoglobin: 9.5 g/dL — ABNORMAL LOW (ref 13.0–17.0)
Immature Granulocytes: 1 %
Lymphocytes Relative: 26 %
Lymphs Abs: 0.9 K/uL (ref 0.7–4.0)
MCH: 30.9 pg (ref 26.0–34.0)
MCHC: 32.8 g/dL (ref 30.0–36.0)
MCV: 94.5 fL (ref 80.0–100.0)
Monocytes Absolute: 0.3 K/uL (ref 0.1–1.0)
Monocytes Relative: 9 %
Neutro Abs: 2.1 K/uL (ref 1.7–7.7)
Neutrophils Relative %: 62 %
Platelets: 276 K/uL (ref 150–400)
RBC: 3.07 MIL/uL — ABNORMAL LOW (ref 4.22–5.81)
RDW: 17.4 % — ABNORMAL HIGH (ref 11.5–15.5)
WBC: 3.3 K/uL — ABNORMAL LOW (ref 4.0–10.5)
nRBC: 0 % (ref 0.0–0.2)

## 2024-02-18 LAB — COMPREHENSIVE METABOLIC PANEL WITH GFR
ALT: <5 U/L (ref 0–44)
AST: <10 U/L — ABNORMAL LOW (ref 15–41)
Albumin: 3 g/dL — ABNORMAL LOW (ref 3.5–5.0)
Alkaline Phosphatase: 49 U/L (ref 38–126)
Anion gap: 14 (ref 5–15)
BUN: 15 mg/dL (ref 6–20)
CO2: 24 mmol/L (ref 22–32)
Calcium: 8.8 mg/dL — ABNORMAL LOW (ref 8.9–10.3)
Chloride: 99 mmol/L (ref 98–111)
Creatinine, Ser: 1.11 mg/dL (ref 0.61–1.24)
GFR, Estimated: >60 mL/min (ref >60)
Glucose, Bld: 101 mg/dL — ABNORMAL HIGH (ref 70–99)
Potassium: 3.2 mmol/L — ABNORMAL LOW (ref 3.5–5.1)
Sodium: 137 mmol/L (ref 135–145)
Total Bilirubin: 0.3 mg/dL (ref 0.0–1.2)
Total Protein: 5.3 g/dL — ABNORMAL LOW (ref 6.5–8.1)

## 2024-02-18 LAB — MAGNESIUM: Magnesium: 1.7 mg/dL (ref 1.7–2.4)

## 2024-02-18 MED ORDER — HYDROMORPHONE HCL 1 MG/ML IJ SOLN
2.0000 mg | Freq: Once | INTRAMUSCULAR | Status: AC
  Administered 2024-02-18: 2 mg via INTRAVENOUS
  Filled 2024-02-18: qty 2

## 2024-02-18 MED ORDER — SODIUM CHLORIDE 0.9 % IV SOLN
20.0000 mg | Freq: Once | INTRAVENOUS | Status: AC
  Administered 2024-02-18: 20 mg via INTRAVENOUS
  Filled 2024-02-18: qty 20

## 2024-02-18 MED ORDER — ACETAMINOPHEN 325 MG PO TABS
650.0000 mg | ORAL_TABLET | Freq: Once | ORAL | Status: AC
  Administered 2024-02-18: 650 mg via ORAL
  Filled 2024-02-18: qty 2

## 2024-02-18 MED ORDER — SODIUM CHLORIDE 0.9 % IV SOLN: INTRAVENOUS | Status: DC

## 2024-02-18 MED ORDER — SODIUM CHLORIDE 0.9 % IV SOLN: Freq: Once | INTRAVENOUS | Status: AC

## 2024-02-18 MED ORDER — DEXTROSE 5 % IV SOLN
56.0000 mg/m2 | Freq: Once | INTRAVENOUS | Status: AC
  Administered 2024-02-18: 120 mg via INTRAVENOUS
  Filled 2024-02-18: qty 60

## 2024-02-18 MED ORDER — SODIUM CHLORIDE 0.9 % IV SOLN
10.0000 mg/kg | Freq: Once | INTRAVENOUS | Status: AC
  Administered 2024-02-18: 1000 mg via INTRAVENOUS
  Filled 2024-02-18: qty 50

## 2024-02-18 MED ORDER — PALONOSETRON HCL INJECTION 0.25 MG/5ML
0.2500 mg | Freq: Once | INTRAVENOUS | Status: AC
  Administered 2024-02-18: 0.25 mg via INTRAVENOUS
  Filled 2024-02-18: qty 5

## 2024-02-18 MED ORDER — FAMOTIDINE IN NACL 20-0.9 MG/50ML-% IV SOLN
20.0000 mg | Freq: Once | INTRAVENOUS | Status: AC
  Administered 2024-02-18: 20 mg via INTRAVENOUS
  Filled 2024-02-18: qty 50

## 2024-02-18 MED ORDER — POTASSIUM CHLORIDE CRYS ER 20 MEQ PO TBCR
40.0000 meq | EXTENDED_RELEASE_TABLET | Freq: Once | ORAL | Status: AC
  Administered 2024-02-18: 40 meq via ORAL
  Filled 2024-02-18: qty 2

## 2024-02-18 MED ORDER — DIPHENHYDRAMINE HCL 50 MG/ML IJ SOLN
50.0000 mg | Freq: Once | INTRAMUSCULAR | Status: AC
  Administered 2024-02-18: 50 mg via INTRAVENOUS
  Filled 2024-02-18: qty 1

## 2024-02-18 MED ORDER — OXYCODONE HCL 5 MG PO TABS
5.0000 mg | ORAL_TABLET | Freq: Once | ORAL | Status: AC
  Administered 2024-02-18: 5 mg via ORAL
  Filled 2024-02-18: qty 1

## 2024-02-18 NOTE — Progress Notes (Signed)
 Patient presents today for chemotherapy Kyprolis  and Sarclisa  infusion. Patient is in satisfactory condition with no new complaints voiced.  Vital signs are stable.  Labs reviewed by Dr. Davonna during the office visit and all labs are within treatment parameters.  We will proceed with treatment per MD orders.   Treatment given today per MD orders. Tolerated infusion without adverse affects. Vital signs stable. No complaints at this time. Discharged from clinic via stretcher by EMS transportation in stable condition. Alert and oriented x 3. F/U with [facility] as scheduled.

## 2024-02-18 NOTE — Progress Notes (Signed)
 SPIRITUAL CARE AND COUNSELING CONSULT NOTE   VISIT SUMMARY   Reason for Visit: Chaplain making scheduled follow-up with Pt I previously connected with.  Description of Visit: I entered the room and found Frank [name] lying in the bed receiving his treatment, with no one there as a support person.    Pt reports that he is doing well.  He says he enjoyed the holiday with his wife and is anticipating returning home around the 13th of this month.  He hopes to stay at home for Christmas and to help his wife.  He states she will have less stress once he is home.  Plan of Care: I will continue to follow Frank [name] on a Monthly basis.   SPIRITUAL ENCOUNTER                                                                                                                                                                      Type of Visit: Follow up Care provided to:: Patient Referral source: Chaplain assessment Reason for visit: Routine spiritual support OnCall Visit: No   SPIRITUAL FRAMEWORK  Presenting Themes: Goals in life/care, Values and beliefs, Impactful experiences and emotions Values/beliefs: Pt has recently prefessed Christian faith Community/Connection: Family Strengths: humor; Zest for life Needs/Challenges/Barriers: Curr3ently in Rehab facility Patient Stress Factors: Health changes Family Stress Factors: Exhausted, Other (Comment) (Wife caring for Pt and her own elderly mother who has advanced demetia)   GOALS   Self/Personal Goals: Be home for Christmas Clinical Care Goals: Build and support resiliance   INTERVENTIONS   Spiritual Care Interventions Made: Compassionate presence, Reflective listening, Narrative/life review, Encouragement    INTERVENTION OUTCOMES   Outcomes: Connection to spiritual care  SPIRITUAL CARE PLAN   Spiritual Care Issues Still Outstanding: Chaplain will continue to follow    Frank [name], MDiv Chaplain, [facility] [name] [phone] 02/18/2024 11:43 AM

## 2024-02-19 LAB — KAPPA/LAMBDA LIGHT CHAINS
Kappa free light chain: 59.9 mg/L — ABNORMAL HIGH (ref 3.3–19.4)
Kappa, lambda light chain ratio: 1.83 — ABNORMAL HIGH (ref 0.26–1.65)
Lambda free light chains: 32.7 mg/L — ABNORMAL HIGH (ref 5.7–26.3)

## 2024-02-23 LAB — MULTIPLE MYELOMA PANEL, SERUM
Albumin SerPl Elph-Mcnc: 2.5 g/dL — ABNORMAL LOW (ref 2.9–4.4)
Albumin/Glob SerPl: 1.1 (ref 0.7–1.7)
Alpha 1: 0.3 g/dL (ref 0.0–0.4)
Alpha2 Glob SerPl Elph-Mcnc: 0.9 g/dL (ref 0.4–1.0)
B-Globulin SerPl Elph-Mcnc: 0.8 g/dL (ref 0.7–1.3)
Gamma Glob SerPl Elph-Mcnc: 0.5 g/dL (ref 0.4–1.8)
Globulin, Total: 2.5 g/dL (ref 2.2–3.9)
IgA: 121 mg/dL (ref 90–386)
IgG (Immunoglobin G), Serum: 642 mg/dL (ref 603–1613)
IgM (Immunoglobulin M), Srm: 11 mg/dL — ABNORMAL LOW (ref 20–172)
M Protein SerPl Elph-Mcnc: 0.1 g/dL — ABNORMAL HIGH
Total Protein ELP: 5 g/dL — ABNORMAL LOW (ref 6.0–8.5)

## 2024-02-24 ENCOUNTER — Other Ambulatory Visit: Payer: Self-pay

## 2024-02-24 DIAGNOSIS — C9000 Multiple myeloma not having achieved remission: Secondary | ICD-10-CM

## 2024-02-24 MED ORDER — LENALIDOMIDE 15 MG PO CAPS: 15.0000 mg | ORAL_CAPSULE | Freq: Every day | ORAL | 0 refills | Status: DC

## 2024-02-24 NOTE — Progress Notes (Signed)
 Chart reviewed. Revlimid  refilled per last office note with Dr. Davonna.

## 2024-02-26 ENCOUNTER — Inpatient Hospital Stay: Admitting: Oncology

## 2024-02-26 ENCOUNTER — Inpatient Hospital Stay

## 2024-03-01 ENCOUNTER — Other Ambulatory Visit: Payer: Self-pay | Admitting: Oncology

## 2024-03-01 DIAGNOSIS — C9000 Multiple myeloma not having achieved remission: Secondary | ICD-10-CM

## 2024-03-03 ENCOUNTER — Other Ambulatory Visit: Payer: Self-pay

## 2024-03-04 ENCOUNTER — Inpatient Hospital Stay

## 2024-03-04 ENCOUNTER — Inpatient Hospital Stay: Admitting: Oncology

## 2024-03-04 VITALS — BP 100/61 | HR 74 | Temp 96.7°F | Resp 20

## 2024-03-04 DIAGNOSIS — E876 Hypokalemia: Secondary | ICD-10-CM

## 2024-03-04 DIAGNOSIS — N179 Acute kidney failure, unspecified: Secondary | ICD-10-CM | POA: Diagnosis not present

## 2024-03-04 DIAGNOSIS — D649 Anemia, unspecified: Secondary | ICD-10-CM

## 2024-03-04 DIAGNOSIS — G893 Neoplasm related pain (acute) (chronic): Secondary | ICD-10-CM | POA: Diagnosis not present

## 2024-03-04 DIAGNOSIS — C9000 Multiple myeloma not having achieved remission: Secondary | ICD-10-CM

## 2024-03-04 DIAGNOSIS — Z5112 Encounter for antineoplastic immunotherapy: Secondary | ICD-10-CM | POA: Diagnosis not present

## 2024-03-04 LAB — COMPREHENSIVE METABOLIC PANEL WITH GFR
ALT: <5 U/L (ref 0–44)
AST: <10 U/L — ABNORMAL LOW (ref 15–41)
Albumin: 2.8 g/dL — ABNORMAL LOW (ref 3.5–5.0)
Alkaline Phosphatase: 57 U/L (ref 38–126)
Anion gap: 19 — ABNORMAL HIGH (ref 5–15)
BUN: 17 mg/dL (ref 6–20)
CO2: 21 mmol/L — ABNORMAL LOW (ref 22–32)
Calcium: 8.4 mg/dL — ABNORMAL LOW (ref 8.9–10.3)
Chloride: 95 mmol/L — ABNORMAL LOW (ref 98–111)
Creatinine, Ser: 1.89 mg/dL — ABNORMAL HIGH (ref 0.61–1.24)
GFR, Estimated: 40 mL/min — ABNORMAL LOW (ref >60)
Glucose, Bld: 162 mg/dL — ABNORMAL HIGH (ref 70–99)
Potassium: 2.4 mmol/L — CL (ref 3.5–5.1)
Sodium: 135 mmol/L (ref 135–145)
Total Bilirubin: 0.3 mg/dL (ref 0.0–1.2)
Total Protein: 5.4 g/dL — ABNORMAL LOW (ref 6.5–8.1)

## 2024-03-04 LAB — CBC WITH DIFFERENTIAL/PLATELET
Abs Immature Granulocytes: 0.09 K/uL — ABNORMAL HIGH (ref 0.00–0.07)
Basophils Absolute: 0 K/uL (ref 0.0–0.1)
Basophils Relative: 1 %
Eosinophils Absolute: 0.1 K/uL (ref 0.0–0.5)
Eosinophils Relative: 2 %
HCT: 28.4 % — ABNORMAL LOW (ref 39.0–52.0)
Hemoglobin: 9.2 g/dL — ABNORMAL LOW (ref 13.0–17.0)
Immature Granulocytes: 3 %
Lymphocytes Relative: 17 %
Lymphs Abs: 0.5 K/uL — ABNORMAL LOW (ref 0.7–4.0)
MCH: 31.5 pg (ref 26.0–34.0)
MCHC: 32.4 g/dL (ref 30.0–36.0)
MCV: 97.3 fL (ref 80.0–100.0)
Monocytes Absolute: 0.5 K/uL (ref 0.1–1.0)
Monocytes Relative: 15 %
Neutro Abs: 2 K/uL (ref 1.7–7.7)
Neutrophils Relative %: 62 %
Platelets: 199 K/uL (ref 150–400)
RBC: 2.92 MIL/uL — ABNORMAL LOW (ref 4.22–5.81)
RDW: 15.8 % — ABNORMAL HIGH (ref 11.5–15.5)
WBC: 3.2 K/uL — ABNORMAL LOW (ref 4.0–10.5)
nRBC: 0 % (ref 0.0–0.2)

## 2024-03-04 LAB — FERRITIN: Ferritin: 382 ng/mL — ABNORMAL HIGH (ref 24–336)

## 2024-03-04 LAB — IRON AND TIBC
Iron: 29 ug/dL — ABNORMAL LOW (ref 45–182)
Saturation Ratios: 16 % — ABNORMAL LOW (ref 17.9–39.5)
TIBC: 178 ug/dL — ABNORMAL LOW (ref 250–450)
UIBC: 149 ug/dL

## 2024-03-04 LAB — MAGNESIUM: Magnesium: 1.6 mg/dL — ABNORMAL LOW (ref 1.7–2.4)

## 2024-03-04 MED ORDER — HYDROMORPHONE HCL 1 MG/ML IJ SOLN: 2.0000 mg | Freq: Once | INTRAMUSCULAR | Status: DC

## 2024-03-04 MED ORDER — POTASSIUM CHLORIDE 10 MEQ/100ML IV SOLN
10.0000 meq | Freq: Once | INTRAVENOUS | Status: AC
  Administered 2024-03-04: 15:00:00 10 meq via INTRAVENOUS

## 2024-03-04 MED ORDER — DEXAMETHASONE SODIUM PHOSPHATE 100 MG/10ML IJ SOLN
20.0000 mg | Freq: Once | INTRAMUSCULAR | Status: AC
  Administered 2024-03-04: 11:00:00 20 mg via INTRAVENOUS
  Filled 2024-03-04: qty 2

## 2024-03-04 MED ORDER — FAMOTIDINE IN NACL 20-0.9 MG/50ML-% IV SOLN
20.0000 mg | Freq: Once | INTRAVENOUS | Status: AC
  Administered 2024-03-04: 10:00:00 20 mg via INTRAVENOUS
  Filled 2024-03-04: qty 50

## 2024-03-04 MED ORDER — SODIUM CHLORIDE 0.9 % IV SOLN
10.0000 mg/kg | Freq: Once | INTRAVENOUS | Status: AC
  Administered 2024-03-04: 14:00:00 1000 mg via INTRAVENOUS
  Filled 2024-03-04: qty 50

## 2024-03-04 MED ORDER — MAGNESIUM SULFATE 2 GM/50ML IV SOLN
2.0000 g | Freq: Once | INTRAVENOUS | Status: AC
  Administered 2024-03-04: 11:00:00 2 g via INTRAVENOUS
  Filled 2024-03-04: qty 50

## 2024-03-04 MED ORDER — ACETAMINOPHEN 325 MG PO TABS
650.0000 mg | ORAL_TABLET | Freq: Once | ORAL | Status: AC
  Administered 2024-03-04: 10:00:00 650 mg via ORAL
  Filled 2024-03-04: qty 2

## 2024-03-04 MED ORDER — POTASSIUM CHLORIDE CRYS ER 20 MEQ PO TBCR
40.0000 meq | EXTENDED_RELEASE_TABLET | Freq: Once | ORAL | Status: AC
  Administered 2024-03-04: 10:00:00 40 meq via ORAL
  Filled 2024-03-04: qty 2

## 2024-03-04 MED ORDER — SODIUM CHLORIDE 0.9 % IV SOLN: INTRAVENOUS | Status: DC

## 2024-03-04 MED ORDER — POTASSIUM CHLORIDE CRYS ER 20 MEQ PO TBCR
40.0000 meq | EXTENDED_RELEASE_TABLET | Freq: Once | ORAL | Status: AC
  Administered 2024-03-04: 14:00:00 40 meq via ORAL
  Filled 2024-03-04: qty 2

## 2024-03-04 MED ORDER — DIPHENHYDRAMINE HCL 50 MG/ML IJ SOLN
25.0000 mg | Freq: Once | INTRAMUSCULAR | Status: AC
  Administered 2024-03-04: 11:00:00 25 mg via INTRAVENOUS
  Filled 2024-03-04: qty 1

## 2024-03-04 MED ORDER — OXYCODONE HCL 5 MG PO TABS
5.0000 mg | ORAL_TABLET | Freq: Once | ORAL | Status: DC
  Filled 2024-03-04: qty 1

## 2024-03-04 MED ORDER — OXYCODONE HCL 5 MG PO TABS
5.0000 mg | ORAL_TABLET | Freq: Once | ORAL | Status: AC
  Administered 2024-03-04: 10:00:00 5 mg via ORAL

## 2024-03-04 MED ORDER — DEXTROSE 5 % IV SOLN
56.0000 mg/m2 | Freq: Once | INTRAVENOUS | Status: AC
  Administered 2024-03-04: 13:00:00 120 mg via INTRAVENOUS
  Filled 2024-03-04: qty 60

## 2024-03-04 MED ORDER — PALONOSETRON HCL INJECTION 0.25 MG/5ML
0.2500 mg | Freq: Once | INTRAVENOUS | Status: AC
  Administered 2024-03-04: 10:00:00 0.25 mg via INTRAVENOUS
  Filled 2024-03-04: qty 5

## 2024-03-04 MED ADMIN — Potassium Chloride Inj 10 mEq/100ML: 10 meq | INTRAVENOUS | @ 11:00:00 | NDC 00338070948

## 2024-03-04 MED FILL — Potassium Chloride Inj 10 mEq/100ML: 10.0000 meq | INTRAVENOUS | Qty: 100 | Status: AC

## 2024-03-04 NOTE — Progress Notes (Signed)
 Decrease diphenhydramine  to 25 mg due to hypotensive condition.  V.O. Dr Ivery Molt, PharmD

## 2024-03-04 NOTE — Progress Notes (Signed)
 Patient presents today for chemotherapy Kyprolis /Sarclisa  infusion. Patient is in satisfactory condition with no new complaints voiced.  All other vital signs are stable. Patient's BP noted to be 87/62. Patient will receive 1 Liter of NS over 1 hour per Dr.Kandala. Patient will receive potassium chloride  40 mEq PO x 1 dose, 20 mEq IV, 40 mEq PO x 1 dose, and 2g IV magnesium  sulfate per Dr.Kandala's standing orders.  Labs reviewed by Dr. Davonna during the office visit and all labs are within treatment parameters.  We will proceed with treatment per MD orders.

## 2024-03-04 NOTE — Progress Notes (Signed)
 Patient has been examined by Dr. Davonna. Vital signs and labs have been reviewed by MD - ANC, Creatinine, LFTs, hemoglobin, and platelets have been reviewed by M.D. - pt may proceed with treatment.  Primary RN and pharmacy notified.

## 2024-03-04 NOTE — Progress Notes (Signed)
 CRITICAL VALUE ALERT Critical value received:  Potassium Date of notification:  03-04-2024 Time of notification: 08:40 am Critical value read back:  Yes.   Nurse who received alert:  B.Sharonda Llamas RN.  MD notified time and response:  Dr. Davonna @ 08:45 am. Standing orders followed.

## 2024-03-04 NOTE — Progress Notes (Signed)
° °  Established Patient Office Visit  Subjective   Patient ID: Frank Degroat., male    DOB: 1965/07/27  Age: 58 y.o. MRN: [id number]  No chief complaint on file.   HPI    ROS    Objective:     There were no vitals taken for this visit.   Physical Exam   Results for orders placed or performed in visit on 03/04/24  Magnesium   Result Value Ref Range   Magnesium  1.6 (L) 1.7 - 2.4 mg/dL  CBC with Differential  Result Value Ref Range   WBC 3.2 (L) 4.0 - 10.5 K/uL   RBC 2.92 (L) 4.22 - 5.81 MIL/uL   Hemoglobin 9.2 (L) 13.0 - 17.0 g/dL   HCT 71.5 (L) 60.9 - 47.9 %   MCV 97.3 80.0 - 100.0 fL   MCH 31.5 26.0 - 34.0 pg   MCHC 32.4 30.0 - 36.0 g/dL   RDW 84.1 (H) 88.4 - 84.4 %   Platelets 199 150 - 400 K/uL   nRBC 0.0 0.0 - 0.2 %   Neutrophils Relative % 62 %   Neutro Abs 2.0 1.7 - 7.7 K/uL   Lymphocytes Relative 17 %   Lymphs Abs 0.5 (L) 0.7 - 4.0 K/uL   Monocytes Relative 15 %   Monocytes Absolute 0.5 0.1 - 1.0 K/uL   Eosinophils Relative 2 %   Eosinophils Absolute 0.1 0.0 - 0.5 K/uL   Basophils Relative 1 %   Basophils Absolute 0.0 0.0 - 0.1 K/uL   Immature Granulocytes 3 %   Abs Immature Granulocytes 0.09 (H) 0.00 - 0.07 K/uL  Comprehensive metabolic panel  Result Value Ref Range   Sodium 135 135 - 145 mmol/L   Potassium 2.4 (LL) 3.5 - 5.1 mmol/L   Chloride 95 (L) 98 - 111 mmol/L   CO2 21 (L) 22 - 32 mmol/L   Glucose, Bld 162 (H) 70 - 99 mg/dL   BUN 17 6 - 20 mg/dL   Creatinine, Ser 8.10 (H) 0.61 - 1.24 mg/dL   Calcium  8.4 (L) 8.9 - 10.3 mg/dL   Total Protein 5.4 (L) 6.5 - 8.1 g/dL   Albumin  2.8 (L) 3.5 - 5.0 g/dL   AST <89 (L) 15 - 41 U/L   ALT <5 0 - 44 U/L   Alkaline Phosphatase 57 38 - 126 U/L   Total Bilirubin 0.3 0.0 - 1.2 mg/dL   GFR, Estimated 40 (L) >60 mL/min   Anion gap 19 (H) 5 - 15      The ASCVD Risk score (Arnett DK, et al., 2019) failed to calculate for the following reasons:   The valid systolic blood pressure range is 90 to 200  mmHg    Assessment & Plan:   Problem List Items Addressed This Visit   None   No follow-ups on file.    Leotis JONETTA Nasuti, RN

## 2024-03-04 NOTE — Patient Instructions (Signed)

## 2024-03-04 NOTE — Progress Notes (Signed)
 CRITICAL VALUE ALERT Critical value received:  Potassium 2.4 Date of notification:  03-04-2024 Time of notification: 08:40 am Critical value read back:  Yes.   Nurse who received alert:  B.Elsie Sakuma RN.  MD notified time and response:  Dr. Davonna @ 08:45 am. Standing orders followed.

## 2024-03-04 NOTE — Progress Notes (Signed)
 Patient Care Team: [name], MD as PCP - General (Family Medicine) Kate Lonni CROME, MD as PCP - Cardiology (Cardiology) Davonna Siad, MD as Medical Oncologist (Medical Oncology) Celestia Joesph SQUIBB, RN as Oncology Nurse Navigator (Medical Oncology)  Clinic Day:  03/04/2024  Referring physician: [name], MD   CHIEF COMPLAINT:  CC: Multiple myeloma   ASSESSMENT & PLAN:   Assessment & Plan: Frank [name].  is a 58 y.o. male with multiple myeloma  Assessment and Plan  Multiple myeloma R-ISS stage III, standard risk, IgG kappa light chain multiple myeloma Oncology history as above Patient was hospitalized for several months and received 2 cycles of with Velcade , Cytoxan , dexamethasone  followed by 2 cycles of Isatuximab , carfilzomib , Cytoxan  and dexamethasone .  Cytoxan  replaced by Revlimid  15 mg 3 weeks on/1 week off   - Labs reviewed today: CMP: Creatinine: 1.89, calcium : 8.4, K: 2.4, CBC: Hemoglobin: 9.2, WBC: 3.2, platelets: 199.  Previous SPEP: M spike: 0.1, KFLC: 59.9, LFLC:32.7, ratio: 1.83. - Free light chains slightly trending up. Will closely monitor. - Cycle 6-day 1 of carfilzomib , Isatuximab .  Continue at the same doses today. - Revlimid  15 mg 3 weeks on/1 week off-continue with the same doses - Physical exam stable today.  Okay to proceed with treatment - Continue acyclovir  and aspirin  at home  Return to clinic in 2 months prior to cycle 8 to assess for tolerance and response.  Anemia Anemia may be related to Revlimid  treatment. No immediate intervention required.  - Monitor hemoglobin levels regularly. - Will recheck iron panel with next blood draw  Chronic pain Chronic pain managed with Dilaudid , exacerbated by travel but improved significantly since moving back home.   - Will provide oxycodone  as needed in the infusion clinic.  AKI Likely related to dehydration  -Will administer IV fluids to improve kidney  function.  Impaired mobility Impaired mobility with hesitancy to take first steps. Encouragement to increase activity to prevent deconditioning.  - Working with physical therapy  Hypokalemia Hypomagnesemia Will replace per protocol   The patient understands the plans discussed today and is in agreement with them.  He knows to contact our office if he develops concerns prior to his next appointment.  The total time spent in the appointment was 25 minutes for the encounter with patient, including review of chart and various tests results, discussions about plan of care and coordination of care plan   I,Helena R Teague,acting as a scribe for Siad Davonna, MD.,have documented all relevant documentation on the behalf of Siad Davonna, MD,as directed by  Siad Davonna, MD while in the presence of Siad Davonna, MD.  I, Siad Davonna MD, have reviewed the above documentation for accuracy and completeness, and I agree with the above.    Siad Davonna, MD  [facility] [place] - A DEPT OF [facility] [address] [place] [address] Dept: [phone] Dept Fax: [phone]   Orders Placed This Encounter  Procedures   Iron and TIBC ([facility] ONLY)   Ferritin     ONCOLOGY HISTORY:   Diagnosis: Multiple Myeloma   Presentation: Altered mental status, generalized weakness, hypercalcemia and anemia requiring blood transfusions. -06/29/2023-07/02/2023:Admitted to [facility] for altered mental status, sepsis, hypercalcemia, symptomatic anemia and was found to have lytic bone lesions.  -07/03/2023: Patient was transferred from [facility] for concern for multiple myeloma.  -07/03/2023-11/07/2023: Hospital course: Patient had left humeral fracture, intubated for respiratory failure, and  was requiring multiple transfusions. He was trached and vented. Was also on hemodialysis briefly. Discharged to  [facility] on 11/07/2023 -07/03/2023: Multiple myeloma labs: KFLC:108.3, LFLC:8, Ratio:13.54. Cr:4.32, LDH:125, Hb:7.2,IgG:11,507, IgM:5, IgA:74.             -Urine: KFLC: 354.79,LFLC: 34.89, ratio :34.89. 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              -MM FISH: 1p deletion:detected. 13q del, TP 53,t(11;14),t(4;14),t(14;16),t(14;20): not detected             -Cytogenetics: Complex karyotype -07/08/2023-08/12/2023: Velcade , cyclophosphamide  and dexamethasone . Changed for light chains trending up. -07/22/2023: SPEP: M spike:3.1, IFE:The SPE pattern demonstrates a single peak (M-spike) in the gamma region which may represent monoclonal protein.              -KFLC:138, LFLC:19.9, ratio:6.93       -08/02/2023: XQOR:730.6, LFLC:47.3, Ratio:5.69. SPEP:M spike:2.8, IgG: 4543 -08/21/2023-11/04/2023: Carfilzomib  + Cytoxan  + Isatuximab . Received 2 cycles. Second cycle delayed for humeral surgery -10/13/2023: Left arm amputation: Involvement by the patient's known plasma cell myeloma.  - 12/04/2023-current: Carfilzomib  + Isatuximab  + Revlimid  (changed cytoxan  to Revlimid )  Current Treatment:  Carfilzomib + Isatuximab + Revlimid   INTERVAL HISTORY:   Kyl Givler. is a 58 y.o. male presenting to the clinic today for follow-up of multiple myeloma.   He was recently discharged from rehab to home. Demetri has been taking Revlimid  as prescribed. The pain in his legs has improved. He was previously doing physical therapy to improve weakness in his legs, which he temporarily stopped when first discharged from rehab, and subsequently restarted as he felt his strength had diminished.  His infection has  resolved and pain has significantly improved.  He denies any diarrhea and states he has a good appetite. We dicussed labs today, which showed hypokalemia and hypomagnesia.    I have reviewed the past medical history, past surgical history, social history and family history with the patient and they are unchanged from previous note.  ALLERGIES:  is allergic to morphine  and codeine, shellfish allergy, bupropion, chlorhexidine , codeine, influenza vaccines, morphine , povidone iodine , and metoprolol .  MEDICATIONS:  Current Outpatient Medications  Medication Sig Dispense Refill   amLODipine  (NORVASC ) 10 MG tablet Take 10 mg by mouth daily.     verapamil  (CALAN -SR) 120 MG CR tablet Take 120 mg by mouth daily.     acetaminophen  (TYLENOL ) 325 MG tablet      acyclovir  (ZOVIRAX ) 200 MG capsule Take 2 capsules (400 mg total) by mouth 2 (two) times daily.     albuterol  (PROVENTIL  HFA;VENTOLIN  HFA) 108 (90 Base) MCG/ACT inhaler Inhale 2 puffs into the lungs every 6 (six) hours as needed for wheezing or shortness of breath.     albuterol  (PROVENTIL ) (2.5 MG/3ML) 0.083% nebulizer solution 2.5 mg every 2 (two) hours as needed for wheezing or shortness of breath.     apixaban  (ELIQUIS ) 5 MG TABS tablet Take by mouth.     atorvastatin  (LIPITOR) 40 MG tablet Take 40 mg by mouth.     bisacodyl (DULCOLAX) 10 MG suppository Place rectally.     busPIRone  (BUSPAR ) 15 MG tablet Take 15 mg by mouth 2 (  two) times daily.     celecoxib  (CELEBREX ) 200 MG capsule Take 1 capsule every day by oral route for 180 days.     chlorthalidone (HYGROTON) 25 MG tablet Take by mouth.     collagenase  (SANTYL ) 250 UNIT/GM ointment Apply topically daily.     cyclobenzaprine  (FLEXERIL ) 5 MG tablet Take 1 tablet (5 mg total) by mouth 3 (three) times daily as needed for muscle spasms. (Patient taking differently: Take 5 mg by mouth daily as needed for muscle spasms.) 30 tablet 0   divalproex  (DEPAKOTE ) 500 MG DR tablet Take 500 mg by mouth  3 (three) times daily.     docusate sodium  (COLACE) 100 MG capsule Take by mouth.     EPINEPHrine  0.3 mg/0.3 mL IJ SOAJ injection epinephrine  0.3 mg/0.3 mL auto-injector     ergocalciferol  (VITAMIN D2) 1.25 MG (50000 UT) capsule Take 50,000 Units by mouth once a week.     esomeprazole (NEXIUM) 40 MG capsule Take by mouth.     fluticasone -salmeterol (ADVAIR) 500-50 MCG/ACT AEPB      furosemide  (LASIX ) 40 MG tablet      gabapentin  (NEURONTIN ) 300 MG capsule Take 300 mg by mouth.     glimepiride  (AMARYL ) 2 MG tablet Take 2 mg by mouth.     hydrALAZINE  (APRESOLINE ) 25 MG tablet Take by mouth.     HYDROcodone -acetaminophen  (NORCO/VICODIN) 5-325 MG tablet Take 1 tablet by mouth every 6 (six) hours as needed.     HYDROmorphone  (DILAUDID ) 2 MG tablet Take by mouth.     hydrOXYzine  (ATARAX ) 10 MG tablet Take 1 tablet (10 mg total) by mouth 3 (three) times daily as needed for anxiety.     insulin  aspart (NOVOLOG ) 100 UNIT/ML injection Inject 0-15 Units into the skin 4 (four) times daily -  before meals and at bedtime.     insulin  aspart (NOVOLOG ) 100 UNIT/ML injection Inject 2 Units into the skin 4 (four) times daily -  before meals and at bedtime.     insulin  glargine (LANTUS  SOLOSTAR) 100 UNIT/ML Solostar Pen Inject into the skin.     lenalidomide  (REVLIMID ) 15 MG capsule Take 1 capsule (15 mg total) by mouth daily. Take for 21 days, then hold for 7 days. Repeat every 28 days. 21 capsule 0   lidocaine  (LIDODERM ) 5 % Place 1 patch onto the skin daily. Remove & Discard patch within 12 hours or as directed by MD     lidocaine -prilocaine  (EMLA ) cream Apply 1 Application topically as needed (Place on port site 1h-30 minutes prior to port access). 30 g 6   lisinopril  (ZESTRIL ) 40 MG tablet Take 1 tablet every day by oral route, for hypertension.     metFORMIN  (GLUCOPHAGE ) 1000 MG tablet Take 1 tablet twice a day by oral route for 100 days, for diabetes.     montelukast  (SINGULAIR ) 5 MG chewable tablet Chew 1  tablet (5 mg total) by mouth at bedtime.     nicotine  (NICODERM CQ  - DOSED IN MG/24 HOURS) 14 mg/24hr patch Apply 1 patch every day by transdermal route.     omeprazole (PRILOSEC) 20 MG capsule Take by mouth.     PARoxetine  (PAXIL ) 40 MG tablet Take 40 mg by mouth every morning.     polyethylene glycol (MIRALAX  / GLYCOLAX ) 17 g packet Take 17 g by mouth daily as needed for mild constipation.     spironolactone  (ALDACTONE ) 25 MG tablet Take 1 tablet every day by oral route for 100 days, for EDEMA.  tamsulosin  (FLOMAX ) 0.4 MG CAPS capsule Take 0.4 mg by mouth at bedtime.     testosterone  cypionate (DEPOTESTOSTERONE CYPIONATE) 200 MG/ML injection Inject 200 mg into the muscle every 14 (fourteen) days.     TRUE METRIX BLOOD GLUCOSE TEST test strip SMARTSIG:Via Meter     umeclidinium-vilanterol (ANORO ELLIPTA ) 62.5-25 MCG/ACT AEPB Inhale 1 puff into the lungs daily.     valproic  acid (DEPAKENE ) 250 MG capsule Take by mouth.     valproic  acid (DEPAKENE ) 250 MG/5ML solution Take 15 mLs (750 mg total) by mouth 2 (two) times daily.     verapamil  (CALAN ) 120 MG tablet Take by mouth.     No current facility-administered medications for this visit.   Facility-Administered Medications Ordered in Other Visits  Medication Dose Route Frequency Provider Last Rate Last Admin   0.9 %  sodium chloride  infusion   Intravenous Continuous Margareth Kanner, MD 20 mL/hr at 01/08/24 1440 Infusion Verify at 01/08/24 1440   0.9 %  sodium chloride  infusion   Intravenous Continuous Valerya Maxton, MD 10 mL/hr at 03/04/24 0956 New Bag at 03/04/24 0956   carfilzomib  (KYPROLIS ) 120 mg in dextrose  5 % 100 mL chemo infusion  56 mg/m2 (Treatment Plan Recorded) Intravenous Once Zai Chmiel, MD       dexamethasone  (DECADRON ) 20 mg in sodium chloride  0.9 % 50 mL IVPB  20 mg Intravenous Once Tristian Sickinger, MD       diphenhydrAMINE  (BENADRYL ) injection 50 mg  50 mg Intravenous Once Omri Bertran, MD       famotidine   (PEPCID ) IVPB 20 mg premix  20 mg Intravenous Once Gelisa Tieken, MD       isatuximab -irfc (SARCLISA ) 1,000 mg in sodium chloride  0.9 % 200 mL (4 mg/mL) chemo infusion  10 mg/kg (Order-Specific) Intravenous Once Juleen Sorrels, MD       magnesium  sulfate IVPB 2 g 50 mL  2 g Intravenous Once Kenley Troop, MD       oxyCODONE  (Oxy IR/ROXICODONE ) immediate release tablet 5 mg  5 mg Oral Once Franck Vinal, MD       potassium chloride  10 mEq in 100 mL IVPB  10 mEq Intravenous Q1 Hr x 2 Irbin Fines, MD       potassium chloride  SA (KLOR-CON  M) CR tablet 40 mEq  40 mEq Oral Once Vihana Kydd, MD        VITALS:  There were no vitals taken for this visit.  Wt Readings from Last 3 Encounters:  12/04/23 226 lb (102.5 kg)  11/04/23 250 lb 3.6 oz (113.5 kg)  06/05/23 250 lb 9.6 oz (113.7 kg)    There is no height or weight on file to calculate BMI.  Performance status (ECOG): 2 - Symptomatic, <50% confined to bed  PHYSICAL EXAM:   GENERAL:alert, no distress and comfortable LYMPH:  no palpable lymphadenopathy in the cervical, axillary or inguinal LUNGS: clear to auscultation and percussion with normal breathing effort HEART: regular rate & rhythm and no murmurs and no lower extremity edema ABDOMEN:abdomen soft, non-tender and normal bowel sounds Musculoskeletal:no cyanosis of digits and no clubbing.  Left arm amputated.   NEURO: alert & oriented x 3 with fluent speech  LABORATORY DATA:  I have reviewed the data as listed     Component Value Date/Time   NA 135 03/04/2024 0756   K 2.4 (LL) 03/04/2024 0756   CL 95 (L) 03/04/2024 0756   CO2 21 (L) 03/04/2024 0756   GLUCOSE 162 (H) 03/04/2024 0756  BUN 17 03/04/2024 0756   CREATININE 1.89 (H) 03/04/2024 0756   CALCIUM  8.4 (L) 03/04/2024 0756   PROT 5.4 (L) 03/04/2024 0756   ALBUMIN  2.8 (L) 03/04/2024 0756   AST <10 (L) 03/04/2024 0756   ALT <5 03/04/2024 0756   ALKPHOS 57 03/04/2024 0756   BILITOT 0.3 03/04/2024 0756    GFRNONAA 40 (L) 03/04/2024 0756   GFRAA >60 12/27/2017 0404     Lab Results  Component Value Date   WBC 3.2 (L) 03/04/2024   NEUTROABS 2.0 03/04/2024   HGB 9.2 (L) 03/04/2024   HCT 28.4 (L) 03/04/2024   MCV 97.3 03/04/2024   PLT 199 03/04/2024      Chemistry      Component Value Date/Time   NA 135 03/04/2024 0756   K 2.4 (LL) 03/04/2024 0756   CL 95 (L) 03/04/2024 0756   CO2 21 (L) 03/04/2024 0756   BUN 17 03/04/2024 0756   CREATININE 1.89 (H) 03/04/2024 0756      Component Value Date/Time   CALCIUM  8.4 (L) 03/04/2024 0756   ALKPHOS 57 03/04/2024 0756   AST <10 (L) 03/04/2024 0756   ALT <5 03/04/2024 0756   BILITOT 0.3 03/04/2024 0756      Latest Reference Range & Units 02/18/24 09:29  Total Protein ELP 6.0 - 8.5 g/dL 5.0 (L) (C)  Albumin  SerPl Elph-Mcnc 2.9 - 4.4 g/dL 2.5 (L) (C)  Albumin /Glob SerPl 0.7 - 1.7  1.1 (C)  Alpha2 Glob SerPl Elph-Mcnc 0.4 - 1.0 g/dL 0.9 (C)  Alpha 1 0.0 - 0.4 g/dL 0.3 (C)  Gamma Glob SerPl Elph-Mcnc 0.4 - 1.8 g/dL 0.5 (C)  M Protein SerPl Elph-Mcnc Not Observed g/dL 0.1 (H) (C)  IFE 1  Comment ! (C)  Globulin, Total 2.2 - 3.9 g/dL 2.5 (C)  B-Globulin SerPl Elph-Mcnc 0.7 - 1.3 g/dL 0.8 (C)  IgG (Immunoglobin G), Serum 603 - 1,613 mg/dL 357  IgM (Immunoglobulin M), Srm 20 - 172 mg/dL 11 (L)  IgA 90 - 613 mg/dL 878  (L): Data is abnormally low (H): Data is abnormally high !: Data is abnormal (C): Corrected   Latest Reference Range & Units 02/18/24 09:35  Kappa free light chain 3.3 - 19.4 mg/L 59.9 (H)  Lambda free light chains 5.7 - 26.3 mg/L 32.7 (H)  Kappa, lambda light chain ratio 0.26 - 1.65  1.83 (H)  (H): Data is abnormally high   RADIOGRAPHIC STUDIES: I have personally reviewed the radiological images as listed and agreed with the findings in the report.  CT ABDOMEN PELVIS W CONTRAST CLINICAL DATA:  Follow-up drainage catheter  EXAM: CT ABDOMEN AND PELVIS WITH CONTRAST  TECHNIQUE: Multidetector CT imaging of the  abdomen and pelvis was performed using the standard protocol following bolus administration of intravenous contrast.  RADIATION DOSE REDUCTION: This exam was performed according to the departmental dose-optimization program which includes automated exposure control, adjustment of the mA and/or kV according to patient size and/or use of iterative reconstruction technique.  CONTRAST:  OMNIPAQUE  IOHEXOL  350 MG/ML SOLN  COMPARISON:  10/07/2023  FINDINGS: Lower chest: Patchy bibasilar atelectasis is noted worse on the right than the left.  Hepatobiliary: No focal liver abnormality is seen. No gallstones, gallbladder wall thickening, or biliary dilatation.  Pancreas: Unremarkable. No pancreatic ductal dilatation or surrounding inflammatory changes.  Spleen: Normal in size without focal abnormality.  Adrenals/Urinary Tract: Adrenal glands are within normal limits. Kidneys demonstrate scattered cysts bilaterally. No follow-up is recommended. No renal calculi or obstructive changes  are seen. The bladder is partially distended.  Stomach/Bowel: Scattered fecal material is noted throughout the colon without obstructive change. The appendix is within normal limits. Small bowel and stomach are within normal limits.  Vascular/Lymphatic: Aortic atherosclerosis. No enlarged abdominal or pelvic lymph nodes.  Reproductive: Prostate is unremarkable.  Other: No abdominal wall hernia or abnormality. No abdominopelvic ascites.  Musculoskeletal: Left hip replacement is noted. Degenerative changes of lumbar spine are seen. Chronic T11 compression fracture is noted. In the right buttock a pigtail drainage catheter is identified. There remains a fluid collection which measures approximately 11 by 5 cm in greatest transverse and AP dimensions respectively. The collection in the anterior right thigh is incompletely evaluated on this exam but measures at least 8.7 by 2.6 cm in transverse and  AP dimensions respectively. This is slightly smaller seen on the prior study.  IMPRESSION: Fluid collections in the anterior right thigh and right buttock. Drainage catheter remains in the right buttock. The overall appearance is relatively stable.  The anterior right thigh collection is slightly smaller than that seen on the prior exam.  Patchy atelectatic changes in the bases right greater than left.  Electronically Signed   By: Oneil Devonshire M.D.   On: 10/31/2023 03:27

## 2024-03-04 NOTE — Patient Instructions (Addendum)
 [facility] [place] - A DEPT OF [facility]  Discharge Instructions: Thank you for choosing [facility] to provide your oncology and hematology care.  If you have a lab appointment with the Cancer Center - please note that after April 8th, 2024, all labs will be drawn in the cancer center.  You do not have to check in or register with the main entrance as you have in the past but will complete your check-in in the cancer center.  Wear comfortable clothing and clothing appropriate for easy access to any Portacath or PICC line.   We strive to give you quality time with your provider. You may need to reschedule your appointment if you arrive late (15 or more minutes).  Arriving late affects you and other patients whose appointments are after yours.  Also, if you miss three or more appointments without notifying the office, you may be dismissed from the clinic at the providers discretion.      For prescription refill requests, have your pharmacy contact our office and allow 72 hours for refills to be completed.    Today you received the following chemotherapy and/or immunotherapy agents    sarclisa /kyprolis    To help prevent nausea and vomiting after your treatment, we encourage you to take your nausea medication as directed.  BELOW ARE SYMPTOMS THAT SHOULD BE REPORTED IMMEDIATELY: *FEVER GREATER THAN 100.4 F (38 C) OR HIGHER *CHILLS OR SWEATING *NAUSEA AND VOMITING THAT IS NOT CONTROLLED WITH YOUR NAUSEA MEDICATION *UNUSUAL SHORTNESS OF BREATH *UNUSUAL BRUISING OR BLEEDING *URINARY PROBLEMS (pain or burning when urinating, or frequent urination) *BOWEL PROBLEMS (unusual diarrhea, constipation, pain near the anus) TENDERNESS IN MOUTH AND THROAT WITH OR WITHOUT PRESENCE OF ULCERS (sore throat, sores in mouth, or a toothache) UNUSUAL RASH, SWELLING OR PAIN  UNUSUAL VAGINAL DISCHARGE OR ITCHING   Items with * indicate a potential emergency and should be  followed up as soon as possible or go to the Emergency Department if any problems should occur.  Please show the CHEMOTHERAPY ALERT CARD or IMMUNOTHERAPY ALERT CARD at check-in to the Emergency Department and triage nurse.  Should you have questions after your visit or need to cancel or reschedule your appointment, please contact [facility] [place] - A DEPT OF [facility] [phone]  and follow the prompts.  Office hours are 8:00 a.m. to 4:30 p.m. Monday - Friday. Please note that voicemails left after 4:00 p.m. may not be returned until the following business day.  We are closed weekends and major holidays. You have access to a nurse at all times for urgent questions. Please call the main number to the clinic [phone] and follow the prompts.  For any non-urgent questions, you may also contact your provider using MyChart. We now offer e-Visits for anyone 3 and older to request care online for non-urgent symptoms. For details visit [email or web address].   Also download the MyChart app! Go to the app store, search MyChart, open the app, select [facility], and log in with your MyChart username and password.

## 2024-03-04 NOTE — Progress Notes (Signed)
 Treatment given today per MD orders.  Stable during infusion without adverse affects.  Vital signs stable.  No complaints at this time.  Discharge from clinic ambulatory in stable condition.  Alert and oriented X 3.  Follow up with [facility] as scheduled.

## 2024-03-05 ENCOUNTER — Other Ambulatory Visit: Payer: Self-pay

## 2024-03-10 ENCOUNTER — Inpatient Hospital Stay

## 2024-03-15 ENCOUNTER — Encounter: Payer: Self-pay | Admitting: *Deleted

## 2024-03-19 ENCOUNTER — Inpatient Hospital Stay

## 2024-03-23 DIAGNOSIS — C9000 Multiple myeloma not having achieved remission: Secondary | ICD-10-CM

## 2024-03-23 MED ORDER — LENALIDOMIDE 15 MG PO CAPS: 15.0000 mg | ORAL_CAPSULE | Freq: Every day | ORAL | 0 refills | Status: DC

## 2024-03-23 NOTE — Telephone Encounter (Signed)
 Chart reviewed. Revlimid  refilled per last office note with Dr. Davonna.

## 2024-04-01 ENCOUNTER — Inpatient Hospital Stay: Admitting: Oncology

## 2024-04-01 ENCOUNTER — Inpatient Hospital Stay

## 2024-04-12 ENCOUNTER — Encounter (HOSPITAL_COMMUNITY): Payer: Self-pay | Admitting: Oncology

## 2024-04-14 ENCOUNTER — Other Ambulatory Visit: Payer: Self-pay | Admitting: *Deleted

## 2024-04-15 ENCOUNTER — Inpatient Hospital Stay

## 2024-04-22 ENCOUNTER — Other Ambulatory Visit: Payer: Self-pay | Admitting: Oncology

## 2024-04-22 DIAGNOSIS — C9000 Multiple myeloma not having achieved remission: Secondary | ICD-10-CM

## 2024-04-23 ENCOUNTER — Other Ambulatory Visit: Payer: Self-pay

## 2024-04-23 DIAGNOSIS — C9000 Multiple myeloma not having achieved remission: Secondary | ICD-10-CM

## 2024-04-23 MED ORDER — LENALIDOMIDE 15 MG PO CAPS: 15.0000 mg | ORAL_CAPSULE | Freq: Every day | ORAL | 0 refills | Status: AC

## 2024-04-23 NOTE — Telephone Encounter (Signed)
 Chart reviewed. Revlimid  refilled per last office note with Dr. Davonna.

## 2024-04-29 ENCOUNTER — Inpatient Hospital Stay: Attending: Oncology

## 2024-04-29 ENCOUNTER — Inpatient Hospital Stay

## 2024-04-29 ENCOUNTER — Inpatient Hospital Stay: Admitting: Oncology

## 2024-05-13 ENCOUNTER — Inpatient Hospital Stay

## 2024-05-27 ENCOUNTER — Inpatient Hospital Stay: Payer: Self-pay

## 2024-05-27 ENCOUNTER — Inpatient Hospital Stay: Payer: Self-pay | Attending: Oncology | Admitting: Oncology
